# Patient Record
Sex: Male | Born: 1955
Health system: Southern US, Community
[De-identification: ages and names within clinical notes are randomized; demographics above are authoritative.]

## PROBLEM LIST (undated history)

## (undated) DIAGNOSIS — I255 Ischemic cardiomyopathy: Secondary | ICD-10-CM

## (undated) DIAGNOSIS — E669 Obesity, unspecified: Secondary | ICD-10-CM

## (undated) DIAGNOSIS — N503 Cyst of epididymis: Secondary | ICD-10-CM

## (undated) DIAGNOSIS — E119 Type 2 diabetes mellitus without complications: Secondary | ICD-10-CM

## (undated) DIAGNOSIS — I513 Intracardiac thrombosis, not elsewhere classified: Secondary | ICD-10-CM

## (undated) DIAGNOSIS — M545 Low back pain, unspecified: Secondary | ICD-10-CM

## (undated) DIAGNOSIS — Z8701 Personal history of pneumonia (recurrent): Secondary | ICD-10-CM

## (undated) DIAGNOSIS — M51369 Other intervertebral disc degeneration, lumbar region without mention of lumbar back pain or lower extremity pain: Secondary | ICD-10-CM

## (undated) DIAGNOSIS — R519 Headache, unspecified: Secondary | ICD-10-CM

## (undated) DIAGNOSIS — Z720 Tobacco use: Secondary | ICD-10-CM

## (undated) DIAGNOSIS — N493 Fournier gangrene: Secondary | ICD-10-CM

## (undated) DIAGNOSIS — K219 Gastro-esophageal reflux disease without esophagitis: Secondary | ICD-10-CM

## (undated) DIAGNOSIS — I2510 Atherosclerotic heart disease of native coronary artery without angina pectoris: Secondary | ICD-10-CM

## (undated) DIAGNOSIS — I10 Essential (primary) hypertension: Secondary | ICD-10-CM

## (undated) DIAGNOSIS — J189 Pneumonia, unspecified organism: Secondary | ICD-10-CM

## (undated) DIAGNOSIS — F1410 Cocaine abuse, uncomplicated: Secondary | ICD-10-CM

## (undated) DIAGNOSIS — G4730 Sleep apnea, unspecified: Secondary | ICD-10-CM

## (undated) DIAGNOSIS — N182 Chronic kidney disease, stage 2 (mild): Secondary | ICD-10-CM

## (undated) DIAGNOSIS — R51 Headache: Secondary | ICD-10-CM

## (undated) DIAGNOSIS — E785 Hyperlipidemia, unspecified: Secondary | ICD-10-CM

## (undated) DIAGNOSIS — M5126 Other intervertebral disc displacement, lumbar region: Secondary | ICD-10-CM

## (undated) DIAGNOSIS — G8929 Other chronic pain: Secondary | ICD-10-CM

## (undated) DIAGNOSIS — I252 Old myocardial infarction: Secondary | ICD-10-CM

## (undated) DIAGNOSIS — M5136 Other intervertebral disc degeneration, lumbar region: Secondary | ICD-10-CM

## (undated) HISTORY — DX: Intracardiac thrombosis, not elsewhere classified: I51.3

## (undated) HISTORY — PX: FRACTURE SURGERY: SHX138

## (undated) HISTORY — PX: APPENDECTOMY: SHX54

## (undated) HISTORY — PX: KNEE ARTHROSCOPY: SHX127

## (undated) HISTORY — DX: Personal history of pneumonia (recurrent): Z87.01

## (undated) HISTORY — DX: Essential (primary) hypertension: I10

## (undated) HISTORY — PX: WRIST FRACTURE SURGERY: SHX121

## (undated) HISTORY — PX: KNEE SURGERY: SHX244

## (undated) HISTORY — PX: CORONARY ANGIOPLASTY WITH STENT PLACEMENT: SHX49

---

## 2006-03-06 ENCOUNTER — Emergency Department (HOSPITAL_COMMUNITY): Admission: EM | Admit: 2006-03-06 | Discharge: 2006-03-06 | Payer: Self-pay | Admitting: Emergency Medicine

## 2006-03-07 ENCOUNTER — Encounter (INDEPENDENT_AMBULATORY_CARE_PROVIDER_SITE_OTHER): Payer: Self-pay | Admitting: Specialist

## 2006-03-07 ENCOUNTER — Ambulatory Visit (HOSPITAL_COMMUNITY): Admission: RE | Admit: 2006-03-07 | Discharge: 2006-03-07 | Payer: Self-pay | Admitting: Internal Medicine

## 2006-03-07 ENCOUNTER — Ambulatory Visit: Payer: Self-pay | Admitting: Internal Medicine

## 2007-01-16 ENCOUNTER — Emergency Department (HOSPITAL_COMMUNITY): Admission: EM | Admit: 2007-01-16 | Discharge: 2007-01-16 | Payer: Self-pay | Admitting: Emergency Medicine

## 2009-01-20 ENCOUNTER — Emergency Department (HOSPITAL_COMMUNITY): Admission: EM | Admit: 2009-01-20 | Discharge: 2009-01-20 | Payer: Self-pay | Admitting: Emergency Medicine

## 2009-10-29 ENCOUNTER — Emergency Department (HOSPITAL_COMMUNITY): Admission: EM | Admit: 2009-10-29 | Discharge: 2009-10-29 | Payer: Self-pay | Admitting: Emergency Medicine

## 2009-11-11 ENCOUNTER — Emergency Department (HOSPITAL_COMMUNITY)
Admission: EM | Admit: 2009-11-11 | Discharge: 2009-11-11 | Payer: Self-pay | Source: Home / Self Care | Admitting: Emergency Medicine

## 2009-11-21 ENCOUNTER — Emergency Department (HOSPITAL_COMMUNITY)
Admission: EM | Admit: 2009-11-21 | Discharge: 2009-11-21 | Payer: Self-pay | Source: Home / Self Care | Admitting: Emergency Medicine

## 2010-03-23 LAB — CBC
MCH: 26.3 pg (ref 26.0–34.0)
MCHC: 32 g/dL (ref 30.0–36.0)
MCV: 82.1 fL (ref 78.0–100.0)
Platelets: 282 10*3/uL (ref 150–400)
RDW: 15.7 % — ABNORMAL HIGH (ref 11.5–15.5)

## 2010-03-23 LAB — COMPREHENSIVE METABOLIC PANEL
AST: 17 U/L (ref 0–37)
BUN: 18 mg/dL (ref 6–23)
CO2: 31 mEq/L (ref 19–32)
Calcium: 9.5 mg/dL (ref 8.4–10.5)
Chloride: 99 mEq/L (ref 96–112)
Creatinine, Ser: 1.4 mg/dL (ref 0.4–1.5)
GFR calc Af Amer: >60 mL/min (ref >60)
GFR calc non Af Amer: 53 mL/min — ABNORMAL LOW (ref >60)
Glucose, Bld: 237 mg/dL — ABNORMAL HIGH (ref 70–99)
Total Bilirubin: 0.5 mg/dL (ref 0.3–1.2)

## 2010-03-23 LAB — DIFFERENTIAL
Basophils Absolute: 0 10*3/uL (ref 0.0–0.1)
Eosinophils Relative: 3 % (ref 0–5)
Lymphocytes Relative: 14 % (ref 12–46)
Neutrophils Relative %: 78 % — ABNORMAL HIGH (ref 43–77)

## 2010-03-23 LAB — CK TOTAL AND CKMB (NOT AT ARMC): Relative Index: INVALID (ref 0.0–2.5)

## 2010-03-23 LAB — TROPONIN I: Troponin I: <0.01 ng/mL (ref 0.00–0.06)

## 2010-05-28 NOTE — Op Note (Signed)
NAMELOGON, UTTECH              ACCOUNT NO.:  [id number]   MEDICAL RECORD NO.:  [id number]          PATIENT TYPE:  AMB   LOCATION:  DAY                           FACILITY:  [facility]   PHYSICIAN:  Lionel December, M.D.    DATE OF BIRTH:  04-Aug-1955   DATE OF PROCEDURE:  03/07/2006  DATE OF DISCHARGE:                               OPERATIVE REPORT   PROCEDURE:  Colonoscopy.   INDICATION:  [name] is a 55 year old African American male who had an  episode of hematochezia lasting a few days about three months ago.  Another episode started over the weekend.  He was seen in the emergency  room by Dr. Margretta Ditty yesterday and noted to have a normal hemoglobin.  He was scheduled for diagnostic colonoscopy.  He recalls that his last  colonoscopy was over ten years ago and was normal.  The procedure risks  were reviewed with the patient and informed consent was obtained.  Family history is negative for CRC.   MEDS FOR CONSCIOUS SEDATION:  Demerol 25 mg IV, Versed 8 mg IV in  divided dose.   FINDINGS:  The procedure was performed in the endoscopy suite.  The  patient's vital signs and O2 sat were monitored during the procedure and  remained stable.  The patient was placed in the left lateral position  and rectal examination was performed.  No abnormality was noted on  external or digital exam.  The Pentax videoscope was placed in the  rectum and advanced under vision into the sigmoid colon and beyond.  Preparation was excellent.  He had some liquid which was easily  suctioned out with the scope.  Two small polyps were noted at the  sigmoid colon, both were ablated via cold biopsy and submitted in one  container.  The scope was advanced to the cecum which was identified by  ileocecal valve and appendiceal orifice/stump.  Pictures were taken for  the record.  As the scope was withdrawn, the colonic mucosa was once  again carefully examined and there were no other polyps or mucosal  abnormalities.   Rectal mucosa was normal.  The scope was retroflexed to  examine the anorectal junction and he had hemorrhoids below the dentate  line and one between 6 and 7 o'clock had a linear tear covered with a  clot.  There was no active bleeding noted on washing.  Pictures were  taken for the record.  The endoscope was then withdrawn.  The patient  tolerated the procedure well.   FINAL DIAGNOSIS:  1. Two small polyps ablated via cold biopsy from sigmoid colon and      submitted in one container.  2. External hemorrhoids with stigmata of recent bleed, i.e., a clot      over a tear.   RECOMMENDATIONS:  1. High fiber diet plus fiber supplement 3-4 grams daily.  2. Anusol HC suppository one per rectum at bedtime for two weeks.      Prescription given for 14 days with one      refill.  3. The patient was also advised to make an appointment to  see his      primary care physician and be evaluated for sleep apnea.      Lionel December, M.D.  Electronically Signed     NR/MEDQ  D:  03/07/2006  T:  03/07/2006  Job:  [id number]   cc:   Rhae Lerner. Margretta Ditty, M.D.  [address]  [place]  [place] [address]

## 2012-07-07 ENCOUNTER — Other Ambulatory Visit: Payer: Self-pay

## 2012-07-07 ENCOUNTER — Inpatient Hospital Stay (HOSPITAL_COMMUNITY)
Admission: EM | Admit: 2012-07-07 | Discharge: 2012-07-10 | DRG: 282 | Disposition: A | Discharge status: Home / Self Care | Payer: Medicare Other | Attending: Cardiology | Admitting: Cardiology

## 2012-07-07 ENCOUNTER — Encounter (HOSPITAL_COMMUNITY): Payer: Self-pay | Admitting: Emergency Medicine

## 2012-07-07 ENCOUNTER — Emergency Department (HOSPITAL_COMMUNITY): Payer: Medicare Other

## 2012-07-07 DIAGNOSIS — N289 Disorder of kidney and ureter, unspecified: Secondary | ICD-10-CM

## 2012-07-07 DIAGNOSIS — I214 Non-ST elevation (NSTEMI) myocardial infarction: Principal | ICD-10-CM | POA: Diagnosis present

## 2012-07-07 DIAGNOSIS — F1410 Cocaine abuse, uncomplicated: Secondary | ICD-10-CM | POA: Diagnosis present

## 2012-07-07 DIAGNOSIS — E119 Type 2 diabetes mellitus without complications: Secondary | ICD-10-CM

## 2012-07-07 DIAGNOSIS — I2510 Atherosclerotic heart disease of native coronary artery without angina pectoris: Secondary | ICD-10-CM | POA: Diagnosis present

## 2012-07-07 DIAGNOSIS — E785 Hyperlipidemia, unspecified: Secondary | ICD-10-CM | POA: Diagnosis present

## 2012-07-07 DIAGNOSIS — I10 Essential (primary) hypertension: Secondary | ICD-10-CM

## 2012-07-07 DIAGNOSIS — I119 Hypertensive heart disease without heart failure: Secondary | ICD-10-CM | POA: Diagnosis present

## 2012-07-07 DIAGNOSIS — F17200 Nicotine dependence, unspecified, uncomplicated: Secondary | ICD-10-CM | POA: Diagnosis present

## 2012-07-07 HISTORY — DX: Hyperlipidemia, unspecified: E78.5

## 2012-07-07 LAB — COMPREHENSIVE METABOLIC PANEL WITH GFR
ALT: 14 U/L (ref 0–53)
AST: 30 U/L (ref 0–37)
Albumin: 3.1 g/dL — ABNORMAL LOW (ref 3.5–5.2)
Alkaline Phosphatase: 99 U/L (ref 39–117)
BUN: 18 mg/dL (ref 6–23)
CO2: 23 meq/L (ref 19–32)
Calcium: 8.4 mg/dL (ref 8.4–10.5)
Chloride: 97 meq/L (ref 96–112)
Creatinine, Ser: 1.07 mg/dL (ref 0.50–1.35)
GFR calc Af Amer: 87 mL/min — ABNORMAL LOW
GFR calc non Af Amer: 75 mL/min — ABNORMAL LOW
Glucose, Bld: 223 mg/dL — ABNORMAL HIGH (ref 70–99)
Potassium: 3.1 meq/L — ABNORMAL LOW (ref 3.5–5.1)
Sodium: 133 meq/L — ABNORMAL LOW (ref 135–145)
Total Bilirubin: 0.2 mg/dL — ABNORMAL LOW (ref 0.3–1.2)
Total Protein: 6.8 g/dL (ref 6.0–8.3)

## 2012-07-07 LAB — POCT I-STAT, CHEM 8
BUN: 17 mg/dL (ref 6–23)
Calcium, Ion: 1.05 mmol/L — ABNORMAL LOW (ref 1.12–1.23)
Chloride: 103 mEq/L (ref 96–112)
Chloride: 103 mEq/L (ref 96–112)
Glucose, Bld: 171 mg/dL — ABNORMAL HIGH (ref 70–99)
HCT: 52 % (ref 39.0–52.0)
Potassium: 3.6 mEq/L (ref 3.5–5.1)
Potassium: 3.7 mEq/L (ref 3.5–5.1)
Sodium: 140 mEq/L (ref 135–145)

## 2012-07-07 LAB — PROTIME-INR
INR: 1.04 (ref 0.00–1.49)
Prothrombin Time: 12.8 seconds (ref 11.6–15.2)
Prothrombin Time: 13.4 s (ref 11.6–15.2)

## 2012-07-07 LAB — D-DIMER, QUANTITATIVE: D-Dimer, Quant: 0.48 ug/mL-FEU (ref 0.00–0.48)

## 2012-07-07 LAB — BASIC METABOLIC PANEL
BUN: 18 mg/dL (ref 6–23)
Calcium: 9.8 mg/dL (ref 8.4–10.5)
GFR calc Af Amer: 65 mL/min — ABNORMAL LOW (ref >90)
GFR calc non Af Amer: 56 mL/min — ABNORMAL LOW (ref >90)
Glucose, Bld: 177 mg/dL — ABNORMAL HIGH (ref 70–99)
Sodium: 137 mEq/L (ref 135–145)

## 2012-07-07 LAB — CBC WITH DIFFERENTIAL/PLATELET
Basophils Absolute: 0 10*3/uL (ref 0.0–0.1)
Basophils Absolute: 0.1 K/uL (ref 0.0–0.1)
Basophils Relative: 0 % (ref 0–1)
Basophils Relative: 0 % (ref 0–1)
Eosinophils Absolute: 0.4 10*3/uL (ref 0.0–0.7)
Eosinophils Absolute: 0.4 K/uL (ref 0.0–0.7)
Eosinophils Relative: 3 % (ref 0–5)
Eosinophils Relative: 4 % (ref 0–5)
HCT: 45.4 % (ref 39.0–52.0)
Hemoglobin: 15.3 g/dL (ref 13.0–17.0)
Lymphocytes Relative: 22 % (ref 12–46)
Lymphs Abs: 2.6 K/uL (ref 0.7–4.0)
MCH: 26.9 pg (ref 26.0–34.0)
MCH: 26.5 pg (ref 26.0–34.0)
MCHC: 33.7 g/dL (ref 30.0–36.0)
MCV: 79.3 fL (ref 78.0–100.0)
MCV: 78.7 fL (ref 78.0–100.0)
Monocytes Absolute: 0.5 K/uL (ref 0.1–1.0)
Monocytes Relative: 4 % (ref 3–12)
Neutro Abs: 8.4 K/uL — ABNORMAL HIGH (ref 1.7–7.7)
Neutrophils Relative %: 71 % (ref 43–77)
Platelets: 273 10*3/uL (ref 150–400)
Platelets: 248 K/uL (ref 150–400)
RBC: 5.77 MIL/uL (ref 4.22–5.81)
RDW: 15.1 % (ref 11.5–15.5)
RDW: 15.4 % (ref 11.5–15.5)
WBC: 13.6 10*3/uL — ABNORMAL HIGH (ref 4.0–10.5)
WBC: 11.9 K/uL — ABNORMAL HIGH (ref 4.0–10.5)

## 2012-07-07 LAB — POCT I-STAT TROPONIN I: Troponin i, poc: 2.04 ng/mL (ref 0.00–0.08)

## 2012-07-07 LAB — HEPATIC FUNCTION PANEL
ALT: 16 U/L (ref 0–53)
AST: 33 U/L (ref 0–37)
Total Protein: 8 g/dL (ref 6.0–8.3)

## 2012-07-07 LAB — MRSA PCR SCREENING: MRSA by PCR: NEGATIVE

## 2012-07-07 MED ORDER — ASPIRIN EC 81 MG PO TBEC
81.0000 mg | DELAYED_RELEASE_TABLET | Freq: Every day | ORAL | Status: DC
  Filled 2012-07-07: qty 1

## 2012-07-07 MED ORDER — HEPARIN (PORCINE) IN NACL 100-0.45 UNIT/ML-% IJ SOLN: 12.0000 [IU]/kg/h | INTRAMUSCULAR | Status: DC

## 2012-07-07 MED ORDER — VITAMIN D (ERGOCALCIFEROL) 1.25 MG (50000 UNIT) PO CAPS: 50000.0000 [IU] | ORAL_CAPSULE | ORAL | Status: DC

## 2012-07-07 MED ORDER — GLIPIZIDE 10 MG PO TABS
10.0000 mg | ORAL_TABLET | Freq: Two times a day (BID) | ORAL | Status: DC
  Administered 2012-07-08 – 2012-07-10 (×5): 10 mg via ORAL
  Filled 2012-07-07 (×8): qty 1

## 2012-07-07 MED ORDER — SODIUM CHLORIDE 0.9 % IV SOLN
Freq: Once | INTRAVENOUS | Status: AC
  Administered 2012-07-07: 14:00:00 via INTRAVENOUS

## 2012-07-07 MED ORDER — HEPARIN (PORCINE) IN NACL 100-0.45 UNIT/ML-% IJ SOLN
1950.0000 [IU]/h | INTRAMUSCULAR | Status: DC
  Administered 2012-07-07: 1350 [IU]/h via INTRAVENOUS
  Administered 2012-07-08: 1750 [IU]/h via INTRAVENOUS
  Filled 2012-07-07 (×2): qty 250

## 2012-07-07 MED ORDER — MAGNESIUM SULFATE 40 MG/ML IJ SOLN
2.0000 g | INTRAMUSCULAR | Status: AC
  Administered 2012-07-07: 2 g via INTRAVENOUS
  Filled 2012-07-07: qty 50

## 2012-07-07 MED ORDER — GABAPENTIN 600 MG PO TABS
300.0000 mg | ORAL_TABLET | Freq: Three times a day (TID) | ORAL | Status: DC
  Filled 2012-07-07: qty 0.5

## 2012-07-07 MED ORDER — SODIUM CHLORIDE 0.9 % IJ SOLN
3.0000 mL | Freq: Two times a day (BID) | INTRAMUSCULAR | Status: DC
  Administered 2012-07-07: 3 mL via INTRAVENOUS

## 2012-07-07 MED ORDER — SODIUM CHLORIDE 0.9 % IV SOLN: INTRAVENOUS | Status: DC

## 2012-07-07 MED ORDER — SODIUM CHLORIDE 0.9 % IV SOLN: 250.0000 mL | INTRAVENOUS | Status: DC | PRN

## 2012-07-07 MED ORDER — HEPARIN BOLUS VIA INFUSION
3000.0000 [IU] | Freq: Once | INTRAVENOUS | Status: AC
  Administered 2012-07-07: 3000 [IU] via INTRAVENOUS
  Filled 2012-07-07: qty 3000

## 2012-07-07 MED ORDER — GABAPENTIN 300 MG PO CAPS
300.0000 mg | ORAL_CAPSULE | Freq: Three times a day (TID) | ORAL | Status: DC
  Administered 2012-07-07 – 2012-07-10 (×8): 300 mg via ORAL
  Filled 2012-07-07 (×10): qty 1

## 2012-07-07 MED ORDER — NICOTINE 21 MG/24HR TD PT24
MEDICATED_PATCH | TRANSDERMAL | Status: AC
  Administered 2012-07-07: 21 mg via TRANSDERMAL
  Filled 2012-07-07: qty 1

## 2012-07-07 MED ORDER — HEPARIN (PORCINE) IN NACL 100-0.45 UNIT/ML-% IJ SOLN
12.0000 [IU]/kg/h | INTRAMUSCULAR | Status: DC
  Administered 2012-07-07: 12 [IU]/kg/h via INTRAVENOUS
  Filled 2012-07-07: qty 250

## 2012-07-07 MED ORDER — DIAZEPAM 2 MG PO TABS
2.0000 mg | ORAL_TABLET | ORAL | Status: DC
  Filled 2012-07-07: qty 1

## 2012-07-07 MED ORDER — HEPARIN BOLUS VIA INFUSION
4000.0000 [IU] | Freq: Once | INTRAVENOUS | Status: AC
  Administered 2012-07-07: 4000 [IU] via INTRAVENOUS

## 2012-07-07 MED ORDER — LISINOPRIL 2.5 MG PO TABS
2.5000 mg | ORAL_TABLET | Freq: Every day | ORAL | Status: DC
  Filled 2012-07-07: qty 1

## 2012-07-07 MED ORDER — NITROGLYCERIN 0.4 MG SL SUBL
0.4000 mg | SUBLINGUAL_TABLET | SUBLINGUAL | Status: DC | PRN
  Administered 2012-07-08 (×2): 0.4 mg via SUBLINGUAL
  Filled 2012-07-07: qty 25

## 2012-07-07 MED ORDER — SODIUM CHLORIDE 0.9 % IV SOLN
INTRAVENOUS | Status: AC
  Administered 2012-07-07: 17:00:00 via INTRAVENOUS

## 2012-07-07 MED ORDER — ACETAMINOPHEN 325 MG PO TABS: 650.0000 mg | ORAL_TABLET | ORAL | Status: DC | PRN

## 2012-07-07 MED ORDER — NICOTINE 21 MG/24HR TD PT24: 21.0000 mg | MEDICATED_PATCH | Freq: Once | TRANSDERMAL | Status: AC

## 2012-07-07 MED ORDER — NYSTATIN 100000 UNIT/ML MT SUSP
500000.0000 [IU] | Freq: Four times a day (QID) | OROMUCOSAL | Status: DC
  Administered 2012-07-07 – 2012-07-10 (×9): 500000 [IU] via ORAL
  Filled 2012-07-07 (×13): qty 5

## 2012-07-07 MED ORDER — SODIUM CHLORIDE 0.9 % IJ SOLN: 3.0000 mL | INTRAMUSCULAR | Status: DC | PRN

## 2012-07-07 MED ORDER — ONDANSETRON HCL 4 MG/2ML IJ SOLN: 4.0000 mg | Freq: Four times a day (QID) | INTRAMUSCULAR | Status: DC | PRN

## 2012-07-07 MED ORDER — ATORVASTATIN CALCIUM 40 MG PO TABS
40.0000 mg | ORAL_TABLET | Freq: Every day | ORAL | Status: DC
  Administered 2012-07-08 – 2012-07-10 (×3): 40 mg via ORAL
  Filled 2012-07-07 (×3): qty 1

## 2012-07-07 MED ORDER — ONDANSETRON HCL 4 MG/2ML IJ SOLN: 4.0000 mg | Freq: Three times a day (TID) | INTRAMUSCULAR | Status: DC | PRN

## 2012-07-07 NOTE — ED Notes (Signed)
Patient arrives via EMS from home with c/o substernal chest pain that started this morning, intermittent. Patient reports one episode last night after eating chicken and that episode eased off. EMS gave 324 mg aspirin and 3 SL nitro that decreased pain. Patient is short of breath.

## 2012-07-07 NOTE — ED Notes (Signed)
Requesting nicotine patch. Dr Hyacinth Meeker approved and order obtained.

## 2012-07-07 NOTE — ED Notes (Signed)
[facility] given rm assignment and has a truck in route.

## 2012-07-07 NOTE — ED Provider Notes (Addendum)
History    This chart was scribed for Russell Roller, MD by [name], ED Scribe. This patient was seen in room [facility] and the patient's care was started 1:24 PM.  CSN: [id number] Arrival date & time 07/07/12  1311  First MD Initiated Contact with Patient 07/07/12 1322     Chief Complaint  Patient presents with  . Chest Pain    The history is provided by the patient. No language interpreter was used.    HPI Comments: [name] is a 57 y.o. male brought in by ambulance, who presents to the Emergency Department complaining of constant, substernal chest pain and pressure starting this morning at 10am. Pt was given 3 SL nitro and 324mg  aspirin by EMS that gradually decreased the pain. Pt states he was laying down when he felt the need to belch but felt "it was hung up." States pain radiates to back. He reports 1 episode last night after eating chicken but that eased off. He has a h/o DM, HTN, HLD. He denies h/o blood clots, MI, stroke. He denies having a stress test performed in the past and denies any known heart blockages. Pt is a current everyday smoker but denies alcohol use.   Pt admits to cocaine use yesterday   Past Medical History  Diagnosis Date  . Hypertension   . Diabetes mellitus without complication   . Hyperlipidemia    No past surgical history on file. No family history on file. History  Substance Use Topics  . Smoking status: Current Every Day Smoker    Types: Cigarettes  . Smokeless tobacco: Not on file  . Alcohol Use: No    Review of Systems A complete 10 system review of systems was obtained and all systems are negative except as noted in the HPI and PMH.   Allergies  Review of patient's allergies indicates no known allergies.  Home Medications   Current Outpatient Rx  Name  Route  Sig  Dispense  Refill  . atorvastatin (LIPITOR) 80 MG tablet   Oral   Take 40 mg by mouth daily.         Marland Kitchen gabapentin (NEURONTIN) 600 MG tablet   Oral    Take 300 mg by mouth 3 (three) times daily.         Marland Kitchen glipiZIDE (GLUCOTROL) 5 MG tablet   Oral   Take 10 mg by mouth 2 (two) times daily before a meal.         . HYDROcodone-acetaminophen (NORCO/VICODIN) 5-325 MG per tablet   Oral   Take 1 tablet by mouth 2 (two) times daily as needed for pain.         Marland Kitchen lisinopril (PRINIVIL,ZESTRIL) 5 MG tablet   Oral   Take 2.5 mg by mouth daily.         . metFORMIN (GLUCOPHAGE) 1000 MG tablet   Oral   Take 1,000 mg by mouth 2 (two) times daily with a meal.         . naproxen (NAPROSYN) 500 MG tablet   Oral   Take 500 mg by mouth 2 (two) times daily with a meal.         . nystatin (MYCOSTATIN) 100000 UNIT/ML suspension   Oral   Take 500,000 Units by mouth 4 (four) times daily.         . Vitamin D, Ergocalciferol, (DRISDOL) 50000 UNITS CAPS   Oral   Take 50,000 Units by mouth 2 (two) times a week.  Takes on Wednesday and Saturday.          BP 131/82  Pulse 71  Temp(Src) 98.3 F (36.8 C) (Oral)  Resp 24  Wt 250 lb (113.399 kg)  SpO2 97% Physical Exam  Nursing note and vitals reviewed. Constitutional: He is oriented to person, place, and time. He appears well-developed and well-nourished.  Uncomfortable appearing.   HENT:  Head: Normocephalic and atraumatic.  Eyes: Conjunctivae and EOM are normal. Pupils are equal, round, and reactive to light.  Neck: Normal range of motion. Neck supple.  Cardiovascular: Normal rate, regular rhythm, normal heart sounds and intact distal pulses.   No murmur heard. Pulmonary/Chest: Breath sounds normal. Tachypnea (mildly) noted. No respiratory distress. He has no wheezes. He has no rales.  Abdominal: Soft. Bowel sounds are normal. There is no tenderness.  Abdomen is obese.  Musculoskeletal: Normal range of motion.  Lipoma to medial trapezius.   Neurological: He is alert and oriented to person, place, and time.  Skin: Skin is warm and dry.  Psychiatric: He has a normal mood and  affect.    ED Course  Procedures (including critical care time)  DIAGNOSTIC STUDIES: Oxygen Saturation is 97% on RA, adequate by my interpretation.    COORDINATION OF CARE: 1:24 PM Discussed treatment plan with pt at bedside and pt agreed to plan.   Labs Reviewed  CBC WITH DIFFERENTIAL - Abnormal; Notable for the following:    WBC 13.6 (*)    RBC 5.98 (*)    Neutrophils Relative % 80 (*)    Neutro Abs 10.8 (*)    All other components within normal limits  POCT I-STAT, CHEM 8 - Abnormal; Notable for the following:    Creatinine, Ser 1.40 (*)    Glucose, Bld 171 (*)    Calcium, Ion 1.06 (*)    Hemoglobin 17.7 (*)    All other components within normal limits  POCT I-STAT, CHEM 8 - Abnormal; Notable for the following:    Glucose, Bld 171 (*)    Calcium, Ion 1.05 (*)    Hemoglobin 17.3 (*)    All other components within normal limits  POCT I-STAT TROPONIN I - Abnormal; Notable for the following:    Troponin i, poc 2.04 (*)    All other components within normal limits  D-DIMER, QUANTITATIVE  APTT  PROTIME-INR  TROPONIN I  BASIC METABOLIC PANEL  HEPATIC FUNCTION PANEL  LIPASE, BLOOD   Dg Chest Port 1 View  07/07/2012   *RADIOLOGY REPORT*  Clinical Data: Chest pain  PORTABLE CHEST - 1 VIEW  Comparison: 11/21/2009  Findings: Low lung volumes.  Lungs are grossly clear.  Mild cardiomegaly.  Normal vascularity.  No pneumothorax.  IMPRESSION: Cardiomegaly without decompensation.   Original Report Authenticated By: Jolaine Click, M.D.   1. NSTEMI (non-ST elevated myocardial infarction)   2. Renal insufficiency     MDM  Pt appears uncomfortable, he has ongoing pain which is now mild but persistent, ASA given pta, nitro with improvement, ECG with deep T wave Inversions in the precordial leads, concern for AMI.  ED ECG REPORT  I personally interpreted this EKG   Date: 07/07/2012   Rate: 72  Rhythm: normal sinus rhythm  QRS Axis: left  Intervals: normal other than prolonged QT   ST/T Wave abnormalities: TWI in the lateral precordial leads and lateral leads  Conduction Disutrbances:none  Narrative Interpretation:   Old EKG Reviewed: c/w 11/21/09, sig changes, TWI present.   Labs show Renal insufficiency and AMI  with trop > 2, Ddimer is normal.    Started on blood thinners - heparin drip, reevalted - still with normla MS, more comfortable appearing.  D/w Dr. Mayford Knife who accepts to step down bed.  Critical care delivered.  CRITICAL CARE Performed by: [name] Total critical care time: 35 Critical care time was exclusive of separately billable procedures and treating other patients. Critical care was necessary to treat or prevent imminent or life-threatening deterioration. Critical care was time spent personally by me on the following activities: development of treatment plan with patient and/or surrogate as well as nursing, discussions with consultants, evaluation of patient's response to treatment, examination of patient, obtaining history from patient or surrogate, ordering and performing treatments and interventions, ordering and review of laboratory studies, ordering and review of radiographic studies, pulse oximetry and re-evaluation of patient's condition.   Meds given in ED:  Medications  magnesium sulfate IVPB 2 g 50 mL (2 g Intravenous New Bag/Given 07/07/12 1347)  heparin ADULT infusion 100 units/mL (25000 units/250 mL) (12 Units/kg/hr  113.4 kg Intravenous New Bag/Given 07/07/12 1400)  0.9 %  sodium chloride infusion ( Intravenous New Bag/Given 07/07/12 1347)  heparin bolus via infusion 4,000 Units (4,000 Units Intravenous Bolus from Bag 07/07/12 1358)    New Prescriptions   No medications on file      I personally performed the services described in this documentation, which was scribed in my presence. The recorded information has been reviewed and is accurate.     Russell Roller, MD 07/07/12 1413  Russell Roller, MD 07/07/12 [phone]

## 2012-07-07 NOTE — H&P (Signed)
Admit date: 07/07/2012 Referring Physician Jeani Hawking ER MD Primary MD [facility] Chief complaint/reason for admission: chest pain  HPI: This is a 57yo AAM with a history of DM, HTN and dyslipidemia who was in his USOH until yesterday around 12Noon.  He says that he had eaten some [employer] and started having chest discomfort in the midsternal region and felt like he had to belch but couldn't.  It radiated into his back.  This was off and on and improved after eating popcorn.  This am it started back with SOB and diaphoresis with dizziness and went to the ER. In the ER he was given SL NTG with improvement in his discomfort but did not make it go away.  When he got to the ER he stood up to change beds and belched and the discomfort went away.  Currently he is pain free. He was noted to have an elevated troponin and was transferred to [facility].   Of note patient admitted to cocaine use a day before his Chest pain started.    PMH:    Past Medical History  Diagnosis Date  . Hypertension   . Diabetes mellitus without complication   . Hyperlipidemia bulging discs in back     PSH:   Appendectomy and knee pinning  ALLERGIES:   Review of patient's allergies indicates no known allergies.  Prior to Admit Meds:   Prescriptions prior to admission  Medication Sig Dispense Refill  . atorvastatin (LIPITOR) 80 MG tablet Take 40 mg by mouth daily.      Marland Kitchen gabapentin (NEURONTIN) 600 MG tablet Take 300 mg by mouth 3 (three) times daily.      Marland Kitchen glipiZIDE (GLUCOTROL) 5 MG tablet Take 10 mg by mouth 2 (two) times daily before a meal.      . HYDROcodone-acetaminophen (NORCO/VICODIN) 5-325 MG per tablet Take 1 tablet by mouth 2 (two) times daily as needed for pain.      Marland Kitchen lisinopril (PRINIVIL,ZESTRIL) 5 MG tablet Take 2.5 mg by mouth daily.      . metFORMIN (GLUCOPHAGE) 1000 MG tablet Take 1,000 mg by mouth 2 (two) times daily with a meal.      . naproxen (NAPROSYN) 500 MG tablet Take 500 mg by  mouth 2 (two) times daily with a meal.      . nystatin (MYCOSTATIN) 100000 UNIT/ML suspension Take 500,000 Units by mouth 4 (four) times daily.      . Vitamin D, Ergocalciferol, (DRISDOL) 50000 UNITS CAPS Take 50,000 Units by mouth 2 (two) times a week. Takes on Wednesday and Saturday.       Family HX:   Mother alive with ? Heart problem.  Does not know his dad.  Aunt with [facility] Social HX:    History   Social History  . Marital Status: Single    Spouse Name: N/A    Number of Children: N/A  . Years of Education: N/A   Occupational History  . Not on file.   Social History Main Topics  . Smoking status: Current Every Day Smoker    Types: Cigarettes 1-2 ppd for 40 years  . Smokeless tobacco: Not on file  . Alcohol Use: No  . Drug Use: Yes    Special: Cocaine - last use 1 day before CP episode  . Sexually Active: Not on file   Other Topics Concern  . Not on file   Social History Narrative  . No narrative on file  ROS:  All 11 ROS were addressed and are negative except what is stated in the HPI  PHYSICAL EXAM Filed Vitals:   07/07/12 1700  BP: 127/80  Pulse: 69  Temp:   Resp: 25   General: Well developed, well nourished, in no acute distress Head: Eyes PERRLA, No xanthomas.   Normal cephalic and atramatic  Lungs:   Clear bilaterally to auscultation and percussion. Heart:   HRRR S1 S2 Pulses are 2+ & equal.            No carotid bruit. No JVD.  No abdominal bruits. No femoral bruits. Abdomen: Bowel sounds are positive, abdomen soft and non-tender without masses  Extremities:   No clubbing, cyanosis or edema.  DP +1 Neuro: Alert and oriented X 3. Psych:  Good affect, responds appropriately   Labs:   Lab Results  Component Value Date   WBC 13.6* 07/07/2012   HGB 17.3* 07/07/2012   HCT 51.0 07/07/2012   MCV 79.3 07/07/2012   PLT 273 07/07/2012    Recent Labs Lab 07/07/12 1324  07/07/12 1336  NA 137  < > 140  K 3.7  < > 3.7  CL 96  < > 103  CO2 27  --   --    BUN 18  < > 17  CREATININE 1.35  < > 1.30  CALCIUM 9.8  --   --   PROT 8.0  --   --   BILITOT 0.2*  --   --   ALKPHOS 111  --   --   ALT 16  --   --   AST 33  --   --   GLUCOSE 177*  < > 171*  < > = values in this interval not displayed. Lab Results  Component Value Date   CKTOTAL 74 11/21/2009   CKMB 0.8 11/21/2009   TROPONINI 4.52* 07/07/2012   No results found for this basename: PTT   Lab Results  Component Value Date   INR 0.98 07/07/2012         Radiology: *RADIOLOGY REPORT*  Clinical Data: Chest pain  PORTABLE CHEST - 1 VIEW  Comparison: 11/21/2009  Findings: Low lung volumes. Lungs are grossly clear. Mild  cardiomegaly. Normal vascularity. No pneumothorax.  IMPRESSION:  Cardiomegaly without decompensation.  Original Report Authenticated By: Jolaine Click, M.D.    EKG:  NSR with deeply inverted T waves in the anterolateral leads  ASSESSMENT:  1.  Acute NSTEMI after cocaine use - now pain free 2.  Polysubstance abuse with cocaine and Tobacco 3.  DM 4.  HTN 5.  dyslipidemia  PLAN:   1.  Admit to CCU 2.  Cycle cardiac enzymes 3.  IV Heparin gtt per pharmacy 4.  No beta blocker due to recent cocaine use 5.  Urine drug screen 6.  ASA/statin 7.  NPO after midnight tomorrow night 8.  Cardiac cath Monday 6/30 Cardiac catheterization was discussed with the patient fully including risks on myocardial infarction, death, stroke, bleeding, arrhythmia, dye allergy, renal insufficiency or bleeding.  All patient questions and concerns were discussed and the patient understands and is willing to proceed.     Quintella Reichert, MD  07/07/2012  5:44 PM

## 2012-07-07 NOTE — ED Notes (Signed)
[facility] at bedside for transport. Patient no distress. Left Department with crew at this time.

## 2012-07-07 NOTE — ED Notes (Signed)
Report given to Marylene Land, charge RN unit [address]

## 2012-07-07 NOTE — ED Notes (Signed)
CRITICAL VALUE ALERT  Critical value received:  Troponin  Date of notification:  07/07/12  Time of notification:  1336  Critical value read back:no (istat, read by primary RN)  Nurse who received alert:  Lake Bells, RN  MD notified (1st page):  Dr Hyacinth Meeker [phone]

## 2012-07-07 NOTE — Progress Notes (Signed)
ANTICOAGULATION CONSULT NOTE - Initial Consult  Pharmacy Consult for heparin  Indication: chest pain/ACS  No Known Allergies  Patient Measurements: Height: 5\' 9"  (175.3 cm) Weight: 229 lb 0.9 oz (103.9 kg) IBW/kg (Calculated) : 70.7 Heparin Dosing Weight: 94  Vital Signs: Temp: 98.2 F (36.8 C) (06/28 1954) Temp src: Oral (06/28 1955) BP: 133/86 mmHg (06/28 2000) Pulse Rate: 79 (06/28 2000)  Labs:  Recent Labs  07/07/12 1324 07/07/12 1330 07/07/12 1336 07/07/12 2238  HGB 16.1 17.7* 17.3* 15.3  HCT 47.4 52.0 51.0 45.4  PLT 273  --   --  248  APTT 30  --   --  47*  LABPROT 12.8  --   --  13.4  INR 0.98  --   --  1.04  HEPARINUNFRC  --   --   --  0.11*  CREATININE 1.35 1.40* 1.30  --   TROPONINI 4.52*  --   --   --     Estimated Creatinine Clearance: 73.6 ml/min (by C-G formula based on Cr of 1.3).   Medical History: Past Medical History  Diagnosis Date  . Hypertension   . Diabetes mellitus without complication   . Hyperlipidemia     Medications:  Scheduled:  . sodium chloride   Intravenous STAT  . [START ON 07/08/2012] aspirin EC  81 mg Oral Daily  . [START ON 07/08/2012] atorvastatin  40 mg Oral Daily  . [START ON 07/09/2012] diazepam  2 mg Oral On Call  . gabapentin  300 mg Oral TID  . [START ON 07/08/2012] glipiZIDE  10 mg Oral BID AC  . [START ON 07/08/2012] lisinopril  2.5 mg Oral Daily  . nystatin  500,000 Units Oral QID  . sodium chloride  3 mL Intravenous Q12H  . [START ON 07/11/2012] Vitamin D (Ergocalciferol)  50,000 Units Oral Custom    Assessment: 57 yo male admitted with NSTEMI after cocaine use. At [facility] patient received heparin 4000 unit bolus, then IV infusion of 1350 units/hr. Pharmacy to manage heparin. Heparin level (0.11) is below-goal. No problem with line / infusion and no bleeding per RN.   Goal of Therapy:  Heparin level 0.3-0.7 units/ml Monitor platelets by anticoagulation protocol: Yes   Plan:  1. Heparin 3000 unit IV bolus, then  increase IV infusion to 1750 units/hr.  2. Heparin level in 6 hours.  3. Daily CBC, heparin level   Emeline Gins 07/07/2012,11:07 PM

## 2012-07-07 NOTE — ED Notes (Signed)
CRITICAL VALUE ALERT  Critical value received:  troponin  Date of notification:  07/07/12  Time of notification:  1442  Critical value read back:yes  Nurse who received alert:  Lake Bells, RN  MD notified (1st page):  Dr Hyacinth Meeker  Time of first page:  [phone]

## 2012-07-08 ENCOUNTER — Ambulatory Visit (HOSPITAL_COMMUNITY): Admit: 2012-07-08 | Payer: Self-pay | Admitting: Interventional Cardiology

## 2012-07-08 ENCOUNTER — Encounter (HOSPITAL_COMMUNITY)
Admission: EM | Disposition: A | Discharge status: Home / Self Care | Payer: Self-pay | Source: Home / Self Care | Attending: Cardiology

## 2012-07-08 HISTORY — PX: LEFT HEART CATH: SHX5478

## 2012-07-08 HISTORY — PX: PERCUTANEOUS CORONARY STENT INTERVENTION (PCI-S): SHX5485

## 2012-07-08 LAB — TROPONIN I: Troponin I: 4.34 ng/mL (ref <0.30)

## 2012-07-08 LAB — GLUCOSE, CAPILLARY
Glucose-Capillary: 114 mg/dL — ABNORMAL HIGH (ref 70–99)
Glucose-Capillary: 101 mg/dL — ABNORMAL HIGH (ref 70–99)

## 2012-07-08 LAB — LIPID PANEL
HDL: 25 mg/dL — ABNORMAL LOW (ref >39)
LDL Cholesterol: 101 mg/dL — ABNORMAL HIGH (ref 0–99)
Total CHOL/HDL Ratio: 6.2 RATIO

## 2012-07-08 LAB — CBC
HCT: 44.4 % (ref 39.0–52.0)
MCH: 26.9 pg (ref 26.0–34.0)
MCV: 78.6 fL (ref 78.0–100.0)
RBC: 5.65 MIL/uL (ref 4.22–5.81)
WBC: 12.7 10*3/uL — ABNORMAL HIGH (ref 4.0–10.5)

## 2012-07-08 LAB — BASIC METABOLIC PANEL
BUN: 16 mg/dL (ref 6–23)
Calcium: 8.4 mg/dL (ref 8.4–10.5)
Creatinine, Ser: 1.23 mg/dL (ref 0.50–1.35)
GFR calc non Af Amer: 63 mL/min — ABNORMAL LOW (ref >90)
Glucose, Bld: 170 mg/dL — ABNORMAL HIGH (ref 70–99)

## 2012-07-08 LAB — PLATELET INHIBITION P2Y12: Platelet Function  P2Y12: 219 [PRU] (ref 194–418)

## 2012-07-08 LAB — RAPID URINE DRUG SCREEN, HOSP PERFORMED
Amphetamines: NOT DETECTED
Cocaine: POSITIVE — AB
Opiates: NOT DETECTED

## 2012-07-08 SURGERY — LEFT HEART CATH
Anesthesia: Moderate Sedation | Laterality: Bilateral

## 2012-07-08 MED ORDER — SODIUM CHLORIDE 0.9 % IV SOLN
1.0000 mL/kg/h | INTRAVENOUS | Status: AC
  Administered 2012-07-08: 1 mL/kg/h via INTRAVENOUS

## 2012-07-08 MED ORDER — BIVALIRUDIN 250 MG IV SOLR
INTRAVENOUS | Status: AC
  Filled 2012-07-08: qty 250

## 2012-07-08 MED ORDER — NITROGLYCERIN 0.2 MG/ML ON CALL CATH LAB
INTRAVENOUS | Status: AC
  Filled 2012-07-08: qty 1

## 2012-07-08 MED ORDER — SODIUM CHLORIDE 0.9 % IV SOLN
INTRAVENOUS | Status: DC
  Administered 2012-07-08: 07:00:00 via INTRAVENOUS

## 2012-07-08 MED ORDER — DIAZEPAM 2 MG PO TABS
2.0000 mg | ORAL_TABLET | ORAL | Status: AC
  Administered 2012-07-08: 2 mg via ORAL

## 2012-07-08 MED ORDER — MORPHINE SULFATE 2 MG/ML IJ SOLN
2.0000 mg | INTRAMUSCULAR | Status: DC | PRN
  Administered 2012-07-08 – 2012-07-10 (×3): 2 mg via INTRAVENOUS
  Filled 2012-07-08 (×3): qty 1

## 2012-07-08 MED ORDER — FENTANYL CITRATE 0.05 MG/ML IJ SOLN
INTRAMUSCULAR | Status: AC
  Filled 2012-07-08: qty 2

## 2012-07-08 MED ORDER — MIDAZOLAM HCL 2 MG/2ML IJ SOLN
INTRAMUSCULAR | Status: AC
  Filled 2012-07-08: qty 2

## 2012-07-08 MED ORDER — ASPIRIN 81 MG PO CHEW
81.0000 mg | CHEWABLE_TABLET | Freq: Every day | ORAL | Status: DC
  Administered 2012-07-08 – 2012-07-10 (×3): 81 mg via ORAL
  Filled 2012-07-08: qty 1

## 2012-07-08 MED ORDER — TICAGRELOR 90 MG PO TABS
ORAL_TABLET | ORAL | Status: AC
  Filled 2012-07-08: qty 2

## 2012-07-08 MED ORDER — ONDANSETRON HCL 4 MG/2ML IJ SOLN: 4.0000 mg | Freq: Four times a day (QID) | INTRAMUSCULAR | Status: DC | PRN

## 2012-07-08 MED ORDER — LIDOCAINE HCL (PF) 1 % IJ SOLN
INTRAMUSCULAR | Status: AC
  Filled 2012-07-08: qty 30

## 2012-07-08 MED ORDER — TICAGRELOR 90 MG PO TABS
90.0000 mg | ORAL_TABLET | Freq: Two times a day (BID) | ORAL | Status: DC
  Administered 2012-07-08 – 2012-07-10 (×5): 90 mg via ORAL
  Filled 2012-07-08 (×6): qty 1

## 2012-07-08 MED ORDER — HEPARIN (PORCINE) IN NACL 2-0.9 UNIT/ML-% IJ SOLN
INTRAMUSCULAR | Status: AC
  Filled 2012-07-08: qty 2000

## 2012-07-08 MED ORDER — ACETAMINOPHEN 325 MG PO TABS: 650.0000 mg | ORAL_TABLET | ORAL | Status: DC | PRN

## 2012-07-08 MED ORDER — ASPIRIN 81 MG PO CHEW
324.0000 mg | CHEWABLE_TABLET | Freq: Once | ORAL | Status: AC
  Administered 2012-07-08: 324 mg via ORAL

## 2012-07-08 MED ORDER — ASPIRIN 81 MG PO CHEW
CHEWABLE_TABLET | ORAL | Status: AC
  Filled 2012-07-08: qty 4

## 2012-07-08 MED ORDER — HEPARIN BOLUS VIA INFUSION
1500.0000 [IU] | Freq: Once | INTRAVENOUS | Status: AC
  Administered 2012-07-08: 1500 [IU] via INTRAVENOUS
  Filled 2012-07-08: qty 1500

## 2012-07-08 MED ORDER — METOPROLOL TARTRATE 12.5 MG HALF TABLET
12.5000 mg | ORAL_TABLET | Freq: Two times a day (BID) | ORAL | Status: DC
  Administered 2012-07-08 – 2012-07-10 (×5): 12.5 mg via ORAL
  Filled 2012-07-08 (×6): qty 1

## 2012-07-08 NOTE — Progress Notes (Signed)
ANTICOAGULATION CONSULT NOTE - Follow Up Consult  Pharmacy Consult for heparin  Indication: chest pain/ACS  No Known Allergies  Patient Measurements: Height: 5\' 9"  (175.3 cm) Weight: 229 lb 0.9 oz (103.9 kg) IBW/kg (Calculated) : 70.7 Heparin Dosing Weight: 94  Vital Signs: Temp: 98.1 F (36.7 C) (06/29 0400) Temp src: Oral (06/29 0400) BP: 118/83 mmHg (06/29 0400) Pulse Rate: 69 (06/29 0400)  Labs:  Recent Labs  07/07/12 1324 07/07/12 1330 07/07/12 1336 07/07/12 2238 07/08/12 0030 07/08/12 0540  HGB 16.1 17.7* 17.3* 15.3  --  15.2  HCT 47.4 52.0 51.0 45.4  --  44.4  PLT 273  --   --  248  --  238  APTT 30  --   --  47*  --   --   LABPROT 12.8  --   --  13.4  --   --   INR 0.98  --   --  1.04  --   --   HEPARINUNFRC  --   --   --  0.11*  --  0.27*  CREATININE 1.35 1.40* 1.30 1.07  --   --   TROPONINI 4.52*  --   --   --  4.34*  --     Estimated Creatinine Clearance: 89.4 ml/min (by C-G formula based on Cr of 1.07).   Medical History: Past Medical History  Diagnosis Date  . Hypertension   . Diabetes mellitus without complication   . Hyperlipidemia     Medications:  Scheduled:  . aspirin EC  81 mg Oral Daily  . atorvastatin  40 mg Oral Daily  . [START ON 07/09/2012] diazepam  2 mg Oral On Call  . gabapentin  300 mg Oral TID  . glipiZIDE  10 mg Oral BID AC  . lisinopril  2.5 mg Oral Daily  . nystatin  500,000 Units Oral QID  . sodium chloride  3 mL Intravenous Q12H  . [START ON 07/11/2012] Vitamin D (Ergocalciferol)  50,000 Units Oral Custom    Assessment: 57 yo male admitted with NSTEMI after cocaine use on IV heparin. Heparin level (0.27) is below-goal, but trending up on 1750 units/hr. No problem with line / infusion and no bleeding per RN.   Goal of Therapy:  Heparin level 0.3-0.7 units/ml Monitor platelets by anticoagulation protocol: Yes   Plan:  1. Heparin 1500 unit IV bolus, then increase IV infusion to 1950 units/hr.  2. Heparin level in 6  hours.  3. Daily CBC, heparin level   Emeline Gins 07/08/2012,6:26 AM

## 2012-07-08 NOTE — CV Procedure (Signed)
PROCEDURE:  Left heart catheterization with selective coronary angiography, left ventriculogram.  PCI LAD, aspiration thrombectomy of the LAD.  INDICATIONS:  Transient anterior ST elevation which resolved, NSTEMI with refractory chest pain  The risks, benefits, and details of the procedure were explained to the patient.  The patient verbalized understanding and wanted to proceed.  Informed written consent was obtained.  PROCEDURE TECHNIQUE:  After Xylocaine anesthesia a 10F sheath was placed in the right femoral artery with a single anterior needle wall stick.   Left coronary angiography was done using a Judkins L4 guide catheter.  Right coronary angiography was done using a Judkins R4 guide catheter.  Left ventriculography was done using a pigtail catheter.    CONTRAST:  Total of 155 cc.  COMPLICATIONS:  None.    HEMODYNAMICS:  Aortic pressure was 130/88; LV pressure was 134/15; LVEDP 28.  There was no gradient between the left ventricle and aorta.    ANGIOGRAPHIC DATA:   The left main coronary artery is widely patent.  The left anterior descending artery is a large vessel proximally.  There is an ostial 25% stenosis followed by an area of ectasia.  In the mid vessel, there is a thrombotic 99% area which appears to be a ruptured plaque.  There is a medium-sized diagonal which comes off after this lesion which is patent.  After the diagonal, there is a 80% lesion which appears to be more stable.  In the mid to distal LAD, there are mild luminal irregularities.  The left circumflex artery is a large vessel with mild irregularities proximally and distally.  The OM1 is a medium to large size vessel which appears patent. The OM2 has sequential 80% and 90% lesions.  It is a medium-size vessel.  The right coronary artery is medium-sized dominant vessel.  There is moderate diffuse disease, up to 50% in the mid to distal vessel.  LEFT VENTRICULOGRAM:  Left ventricular angiogram was done in the 30 RAO  projection and revealed moderately reduced systolic function with an estimated ejection fraction of 40 %.  LVEDP was 28 mmHg.  There is distal anterior and apical hypokinesis of severe degree.  PCI NARRATIVE:  A CLS 3.0 guide catheter was used to engage the left main.  Angiomax was used for anticoagulation.  An ACT was used to confirm that the Angiomax is therapeutic.  Initially, a pro-water wire was placed into a branch off of the circumflex.  The guide catheter when into the circumflex system preferentially.  The wire was placed and the guide was pulled back.  A BMW wire was then directed into the LAD with the guide just outside the left main.  The wire was advanced into the distal LAD.  The guide catheter was advanced into the left main.  A pro-water wire was removed.  A Pronto aspiration catheter was used to perform aspiration thrombectomy, which was successful.  A 2.5 x 15 balloon was used to dilate both areas of stenosis in the mid LAD.  A 2.75 x 33 expedition drug-eluting stent was then deployed across the entire area disease.  The stent was postdilated with a 3.25 post dilatation balloon with several inflations.  There is an excellent angiographic result with no residual stenosis.  The mild disease in the ostial LAD appeared stable postintervention.  IMPRESSIONS:  1. Patent left main coronary artery. 2. 99% thrombotic mid left anterior descending artery lesion with sequential 80% lesion.  The entire area was stented with a 2.75 x 33 Expedition stent,  postdilated to 3.4 mm proximall 3. Mild disease in the  left circumflex artery.  OM 2 with sequential 80% and 90% lesions, medium sized vessel. 4. Moderate diffuse disease in the right coronary artery.  Small to Medium sized PDA with disease ranging from 50-70%. 5. Moderately reduced left ventricular systolic function.  LVEDP 28 mmHg.  Ejection fraction 40%.  RECOMMENDATION:  Continue aggressive risk factor modification including smoking cessation.   We'll hold ACE inhibitor or doubt this given his blood pressure is a little bit low and he received a contrast load.  We'll add beta blocker.  If his creatinine remains stable in the next day or so, can restart lisinopril.  Dual antiplatelet therapy for at least a year.  He would be a reasonable candidate for Effient as well and he can be switched to this medication if he prefers the once daily medicine.  Watch for signs of CHF.  Hopefully he will have significant improvement of his apical LV function.

## 2012-07-08 NOTE — Progress Notes (Signed)
Transported via bed with oxygen, monitor, & 2 nurses to cath lab.

## 2012-07-08 NOTE — Progress Notes (Signed)
Pt transferred to cath lab via telemetry, oxygen, and RN x 2; sig other remains in pt room

## 2012-07-08 NOTE — Progress Notes (Signed)
CRITICAL VALUE ALERT  Critical value received:  Troponin = 4.34  Date of notification:  07/08/2012  Time of notification:  0126  Critical value read back:yes  Nurse who received alert:  G. Mayford Knife RN  MD notified (1st page):  Not called; previous value elevated; now trending downwards  Time of first page:  Not called  MD notified (2nd page):  Time of second page:  Responding MD:  Not called  Time MD responded:  Not called

## 2012-07-08 NOTE — Progress Notes (Signed)
SUBJECTIVE:  Patient just started having 5/10 CP with no improvement in intensity with SL NTG.  OBJECTIVE:   Vitals:   Filed Vitals:   07/07/12 2000 07/08/12 0000 07/08/12 0011 07/08/12 0400  BP: 133/86 114/61  118/83  Pulse: 79 69  69  Temp:   98.4 F (36.9 C) 98.1 F (36.7 C)  TempSrc:   Oral Oral  Resp: 26 20  26   Height:      Weight:      SpO2: 96% 94%  95%   I&O's:   Intake/Output Summary (Last 24 hours) at 07/08/12 1610 Last data filed at 07/08/12 0515  Gross per 24 hour  Intake 1428.3 ml  Output    575 ml  Net  853.3 ml   TELEMETRY: Reviewed telemetry pt in NSR:     PHYSICAL EXAM General: Well developed, well nourished, in no acute distress Head: Eyes PERRLA, No xanthomas.   Normal cephalic and atramatic  Lungs:   Clear bilaterally to auscultation and percussion. Heart:   HRRR S1 S2 Pulses are 2+ & equal. Abdomen: Bowel sounds are positive, abdomen soft and non-tender without masses  Extremities:   No clubbing, cyanosis or edema.  DP +1 Neuro: Alert and oriented X 3. Psych:  Good affect, responds appropriately   LABS: Basic Metabolic Panel:  Recent Labs  96/04/54 2238 07/08/12 0539  NA 133* 135  K 3.1* 3.7  CL 97 101  CO2 23 23  GLUCOSE 223* 170*  BUN 18 16  CREATININE 1.07 1.23  CALCIUM 8.4 8.4   Liver Function Tests:  Recent Labs  07/07/12 1324 07/07/12 2238  AST 33 30  ALT 16 14  ALKPHOS 111 99  BILITOT 0.2* 0.2*  PROT 8.0 6.8  ALBUMIN 3.8 3.1*    Recent Labs  07/07/12 1324  LIPASE 34   CBC:  Recent Labs  07/07/12 1324  07/07/12 2238 07/08/12 0540  WBC 13.6*  --  11.9* 12.7*  NEUTROABS 10.8*  --  8.4*  --   HGB 16.1  < > 15.3 15.2  HCT 47.4  < > 45.4 44.4  MCV 79.3  --  78.7 78.6  PLT 273  --  248 238  < > = values in this interval not displayed. Cardiac Enzymes:  Recent Labs  07/07/12 1324 07/08/12 0030 07/08/12 0539  TROPONINI 4.52* 4.34* 4.41*   BNP: No components found with this basename: POCBNP,   D-Dimer:  Recent Labs  07/07/12 1324  DDIMER 0.48   Hemoglobin A1C: No results found for this basename: HGBA1C,  in the last 72 hours Fasting Lipid Panel:  Recent Labs  07/08/12 0539  CHOL 155  HDL 25*  LDLCALC 101*  TRIG 146  CHOLHDL 6.2   Thyroid Function Tests: No results found for this basename: TSH, T4TOTAL, FREET3, T3FREE, THYROIDAB,  in the last 72 hours Anemia Panel: No results found for this basename: VITAMINB12, FOLATE, FERRITIN, TIBC, IRON, RETICCTPCT,  in the last 72 hours Coag Panel:   Lab Results  Component Value Date   INR 1.04 07/07/2012   INR 0.98 07/07/2012    RADIOLOGY: Dg Chest Port 1 View  07/07/2012   *RADIOLOGY REPORT*  Clinical Data: Chest pain  PORTABLE CHEST - 1 VIEW  Comparison: 11/21/2009  Findings: Low lung volumes.  Lungs are grossly clear.  Mild cardiomegaly.  Normal vascularity.  No pneumothorax.  IMPRESSION: Cardiomegaly without decompensation.   Original Report Authenticated By: Jolaine Click, M.D.   ASSESSMENT:  1. Acute NSTEMI after cocaine  use - now with recurrent 2. Polysubstance abuse with cocaine and Tobacco  3. DM  4. HTN  5. dyslipidemia  6.  Hypotension after SL NTG PLAN:  1. Will take to cath lab now for emergent cath due to recurrent CP in setting of Troponin that is now increasing again with very abnormal EKG - cannot start IV NTG due to hypotension.  Not on beta blockers due to recent cocaine use. Cardiac catheterization was discussed with the patient fully including risks on myocardial infarction, death, stroke, bleeding, arrhythmia, dye allergy, renal insufficiency or bleeding. All patient questions and concerns were discussed and the patient understands and is willing to proceed.        Quintella Reichert, MD  07/08/2012  6:33 AM

## 2012-07-09 ENCOUNTER — Encounter (HOSPITAL_COMMUNITY)
Admission: EM | Disposition: A | Discharge status: Home / Self Care | Payer: Self-pay | Source: Home / Self Care | Attending: Cardiology

## 2012-07-09 LAB — GLUCOSE, CAPILLARY
Glucose-Capillary: 143 mg/dL — ABNORMAL HIGH (ref 70–99)
Glucose-Capillary: 164 mg/dL — ABNORMAL HIGH (ref 70–99)
Glucose-Capillary: 135 mg/dL — ABNORMAL HIGH (ref 70–99)
Glucose-Capillary: 146 mg/dL — ABNORMAL HIGH (ref 70–99)

## 2012-07-09 LAB — BASIC METABOLIC PANEL
Calcium: 8.3 mg/dL — ABNORMAL LOW (ref 8.4–10.5)
GFR calc Af Amer: 81 mL/min — ABNORMAL LOW (ref >90)
GFR calc non Af Amer: 70 mL/min — ABNORMAL LOW (ref >90)
Sodium: 134 mEq/L — ABNORMAL LOW (ref 135–145)

## 2012-07-09 LAB — CBC
MCH: 26.4 pg (ref 26.0–34.0)
Platelets: 228 10*3/uL (ref 150–400)
RBC: 5.22 MIL/uL (ref 4.22–5.81)
WBC: 10.5 10*3/uL (ref 4.0–10.5)

## 2012-07-09 LAB — POCT ACTIVATED CLOTTING TIME: Activated Clotting Time: 503 seconds

## 2012-07-09 SURGERY — LEFT HEART CATHETERIZATION WITH CORONARY ANGIOGRAM
Anesthesia: LOCAL

## 2012-07-09 MED ORDER — MAGNESIUM HYDROXIDE 400 MG/5ML PO SUSP
30.0000 mL | Freq: Every day | ORAL | Status: DC | PRN
  Administered 2012-07-09: 30 mL via ORAL
  Filled 2012-07-09: qty 30

## 2012-07-09 MED ORDER — ISOSORBIDE MONONITRATE ER 30 MG PO TB24
30.0000 mg | ORAL_TABLET | Freq: Every day | ORAL | Status: DC
  Administered 2012-07-09 – 2012-07-10 (×2): 30 mg via ORAL
  Filled 2012-07-09 (×2): qty 1

## 2012-07-09 MED ORDER — BUPROPION HCL ER (SR) 150 MG PO TB12
150.0000 mg | ORAL_TABLET | Freq: Two times a day (BID) | ORAL | Status: DC
  Administered 2012-07-09 – 2012-07-10 (×2): 150 mg via ORAL
  Filled 2012-07-09 (×5): qty 1

## 2012-07-09 MED FILL — Sodium Chloride IV Soln 0.9%: INTRAVENOUS | Qty: 50 | Status: AC

## 2012-07-09 NOTE — Care Management Note (Signed)
    Page 1 of 1   07/09/2012     12:23:37 PM   CARE MANAGEMENT NOTE 07/09/2012  Patient:  [name], [name]   Account Number:  [id number]  Date Initiated:  07/09/2012  Documentation initiated by:  Junius Creamer  Subjective/Objective Assessment:   adm w mi     Action/Plan:   lives w fam   Anticipated DC Date:     Anticipated DC Plan:        DC Planning Services  CM consult  Medication Assistance      Choice offered to / List presented to:             Status of service:   Medicare Important Message given?   (If response is "NO", the following Medicare IM given date fields will be blank) Date Medicare IM given:   Date Additional Medicare IM given:    Discharge Disposition:    Per UR Regulation:  Reviewed for med. necessity/level of care/duration of stay  If discussed at Long Length of Stay Meetings, dates discussed:    Comments:  6/30 1222p debbie Tanise Russman rn,bsn card rehab rep gave pt brilinta 30day free and copay assist card.

## 2012-07-09 NOTE — Progress Notes (Signed)
Received patient from 2900. Alert and oriented, not in any distress, VSS oriented to unit and staff.call light within reach.

## 2012-07-09 NOTE — Progress Notes (Addendum)
CARDIAC REHAB PHASE I   PRE:  Rate/Rhythm: 71 SR  BP:  Sitting: 121/71    MODE:  Ambulation: 700 ft   POST:  Rate/Rhythm: 81 SR  BP:  Sitting: 131/77   9:10am-10:10am Patient walked very well.  He walked without any assistance.  He stated that he could not go any further because of his knee and back. He thinks that he may be able to go longer if he had a cane.   Theresa Duty, [place] 07/09/2012 9:58 AM

## 2012-07-09 NOTE — Progress Notes (Signed)
SUBJECTIVE:  No further CP.  Complains of boil in groin area  OBJECTIVE:   Vitals:   Filed Vitals:   07/09/12 0315 07/09/12 0745 07/09/12 0958 07/09/12 1215  BP: 115/66 105/70 131/77 118/66  Pulse: 61 57 67 71  Temp: 98.6 F (37 C) 98.3 F (36.8 C)  98.6 F (37 C)  TempSrc: Oral Oral  Oral  Resp: 19 18  17   Height:      Weight:      SpO2: 96% 96%  95%   I&O's:   Intake/Output Summary (Last 24 hours) at 07/09/12 1630 Last data filed at 07/09/12 0500  Gross per 24 hour  Intake    550 ml  Output    800 ml  Net   -250 ml   TELEMETRY: Reviewed telemetry pt in NSR:     PHYSICAL EXAM General: Well developed, well nourished, in no acute distress Head: Eyes PERRLA, No xanthomas.   Normal cephalic and atramatic  Lungs:   Clear bilaterally to auscultation and percussion. Heart:   HRRR S1 S2 Pulses are 2+ & equal. Abdomen: Bowel sounds are positive, abdomen soft and non-tender without masses  Extremities:   No clubbing, cyanosis or edema.  DP +1, small ulcer in scrotal area Neuro: Alert and oriented X 3. Psych:  Good affect, responds appropriately   LABS: Basic Metabolic Panel:  Recent Labs  16/10/96 0539 07/09/12 0510  NA 135 134*  K 3.7 3.7  CL 101 102  CO2 23 25  GLUCOSE 170* 178*  BUN 16 14  CREATININE 1.23 1.13  CALCIUM 8.4 8.3*   Liver Function Tests:  Recent Labs  07/07/12 1324 07/07/12 2238  AST 33 30  ALT 16 14  ALKPHOS 111 99  BILITOT 0.2* 0.2*  PROT 8.0 6.8  ALBUMIN 3.8 3.1*    Recent Labs  07/07/12 1324  LIPASE 34   CBC:  Recent Labs  07/07/12 1324  07/07/12 2238 07/08/12 0540 07/09/12 0510  WBC 13.6*  --  11.9* 12.7* 10.5  NEUTROABS 10.8*  --  8.4*  --   --   HGB 16.1  < > 15.3 15.2 13.8  HCT 47.4  < > 45.4 44.4 41.2  MCV 79.3  --  78.7 78.6 78.9  PLT 273  --  248 238 228  < > = values in this interval not displayed. Cardiac Enzymes:  Recent Labs  07/08/12 0030 07/08/12 0539 07/08/12 1108  TROPONINI 4.34* 4.41* 2.11*    BNP: No components found with this basename: POCBNP,  D-Dimer:  Recent Labs  07/07/12 1324  DDIMER 0.48   Hemoglobin A1C:  Recent Labs  07/07/12 2238  HGBA1C 8.5*   Fasting Lipid Panel:  Recent Labs  07/08/12 0539  CHOL 155  HDL 25*  LDLCALC 101*  TRIG 146  CHOLHDL 6.2   Thyroid Function Tests: No results found for this basename: TSH, T4TOTAL, FREET3, T3FREE, THYROIDAB,  in the last 72 hours Anemia Panel: No results found for this basename: VITAMINB12, FOLATE, FERRITIN, TIBC, IRON, RETICCTPCT,  in the last 72 hours Coag Panel:   Lab Results  Component Value Date   INR 1.04 07/07/2012   INR 0.98 07/07/2012    RADIOLOGY: Dg Chest Port 1 View  07/07/2012   *RADIOLOGY REPORT*  Clinical Data: Chest pain  PORTABLE CHEST - 1 VIEW  Comparison: 11/21/2009  Findings: Low lung volumes.  Lungs are grossly clear.  Mild cardiomegaly.  Normal vascularity.  No pneumothorax.  IMPRESSION: Cardiomegaly without decompensation.  Original Report Authenticated By: Jolaine Click, M.D.   ASSESSMENT:  1. Acute NSTEMI after cocaine use -cath showed 99% thrombotic mid LAD with sequential 80% stenosis s/p DES to LAD, OM2 with sequential 80-90% stenosis, moderate diffuse RCA disease with 50-70% stenosis in a small to medium PDA.  Moderate LV dysfunction EF 40%. Increased LVEDP c/w diastolic dysfunction 2. Polysubstance abuse with cocaine and Tobacco  3. DM  4. HTN  5. dyslipidemia  6.  Small folliculits vs boil in scrotal area  PLAN:  1. Transfer to tele bed 2.  Continue ASA/statin/beta blocker 3.  Add Imdur 30mg  daily 4.  Wound care team for boil to groin 5.  Wellbutrin for smoking cessation        Quintella Reichert, MD  07/09/2012  4:30 PM

## 2012-07-09 NOTE — Progress Notes (Signed)
Inpatient Diabetes Program Recommendations  AACE/ADA: New Consensus Statement on Inpatient Glycemic Control (2013)  Target Ranges:  Prepandial:   less than 140 mg/dL      Peak postprandial:   less than 180 mg/dL (1-2 hours)      Critically ill patients:  140 - 180 mg/dL   Inpatient Diabetes Program Recommendations Correction (SSI): start sensitive scale TID Thank you  Piedad Climes BSN, RN,CDE Inpatient Diabetes Coordinator [phone] (team pager)

## 2012-07-10 LAB — BASIC METABOLIC PANEL
BUN: 14 mg/dL (ref 6–23)
Chloride: 102 mEq/L (ref 96–112)
Creatinine, Ser: 1.01 mg/dL (ref 0.50–1.35)
GFR calc Af Amer: >90 mL/min (ref >90)
GFR calc non Af Amer: 80 mL/min — ABNORMAL LOW (ref >90)

## 2012-07-10 LAB — CBC
HCT: 38.2 % — ABNORMAL LOW (ref 39.0–52.0)
MCHC: 33.2 g/dL (ref 30.0–36.0)
Platelets: 218 10*3/uL (ref 150–400)
RDW: 15.2 % (ref 11.5–15.5)
WBC: 10.6 10*3/uL — ABNORMAL HIGH (ref 4.0–10.5)

## 2012-07-10 LAB — GLUCOSE, CAPILLARY: Glucose-Capillary: 171 mg/dL — ABNORMAL HIGH (ref 70–99)

## 2012-07-10 MED ORDER — ASPIRIN 81 MG PO CHEW: 81.0000 mg | CHEWABLE_TABLET | Freq: Every day | ORAL | Status: DC

## 2012-07-10 MED ORDER — ATORVASTATIN CALCIUM 80 MG PO TABS: 80.0000 mg | ORAL_TABLET | Freq: Every day | ORAL | Status: DC

## 2012-07-10 MED ORDER — METOPROLOL TARTRATE 25 MG PO TABS: ORAL_TABLET | ORAL | Status: DC

## 2012-07-10 MED ORDER — ISOSORBIDE MONONITRATE ER 30 MG PO TB24: 30.0000 mg | ORAL_TABLET | Freq: Every day | ORAL | Status: DC

## 2012-07-10 MED ORDER — NITROGLYCERIN 0.4 MG SL SUBL: 0.4000 mg | SUBLINGUAL_TABLET | SUBLINGUAL | Status: DC | PRN

## 2012-07-10 MED ORDER — LISINOPRIL 5 MG PO TABS: 5.0000 mg | ORAL_TABLET | Freq: Every day | ORAL | Status: DC

## 2012-07-10 MED ORDER — TICAGRELOR 90 MG PO TABS: 90.0000 mg | ORAL_TABLET | Freq: Two times a day (BID) | ORAL | Status: DC

## 2012-07-10 MED ORDER — METOPROLOL TARTRATE 12.5 MG HALF TABLET: 12.5000 mg | ORAL_TABLET | Freq: Two times a day (BID) | ORAL | Status: DC

## 2012-07-10 MED ORDER — METFORMIN HCL 1000 MG PO TABS: 1000.0000 mg | ORAL_TABLET | Freq: Two times a day (BID) | ORAL | Status: DC

## 2012-07-10 MED ORDER — BUPROPION HCL ER (SR) 150 MG PO TB12: 150.0000 mg | ORAL_TABLET | Freq: Two times a day (BID) | ORAL | Status: DC

## 2012-07-10 NOTE — Discharge Summary (Addendum)
Patient ID: [name] MRN: [id number] DOB/AGE: Apr 09, 1955 57 y.o.  Admit date: 07/07/2012 Discharge date: 07/10/2012  Primary Discharge Diagnosis  NSTEMI Secondary Discharge Diagnosis  ASCAD with 99% thrombotic mid LAD with sequential 80% stenosis s/p DES to LAD,   OM2 with sequential 80-90% stenosis, moderate diffuse RCA disease with   50-70% stenosis in a small to medium PDA  Moderate LV dysfunction EF 40%  Diastolic dysfunction  Polysubstance abuse with cocaine and Tobacco  DM  HTN  Dyslipidemia  Obesity     Significant Diagnostic Studies: PROCEDURE: Left heart catheterization with selective coronary angiography, left ventriculogram. PCI LAD, aspiration thrombectomy of the LAD.  INDICATIONS: Transient anterior ST elevation which resolved, NSTEMI with refractory chest pain  The risks, benefits, and details of the procedure were explained to the patient. The patient verbalized understanding and wanted to proceed. Informed written consent was obtained.  PROCEDURE TECHNIQUE: After Xylocaine anesthesia a 15F sheath was placed in the right femoral artery with a single anterior needle wall stick. Left coronary angiography was done using a Judkins L4 guide catheter. Right coronary angiography was done using a Judkins R4 guide catheter. Left ventriculography was done using a pigtail catheter.  CONTRAST: Total of 155 cc.  COMPLICATIONS: None.  HEMODYNAMICS: Aortic pressure was 130/88; LV pressure was 134/15; LVEDP 28. There was no gradient between the left ventricle and aorta.  ANGIOGRAPHIC DATA: The left main coronary artery is widely patent.  The left anterior descending artery is a large vessel proximally. There is an ostial 25% stenosis followed by an area of ectasia. In the mid vessel, there is a thrombotic 99% area which appears to be a ruptured plaque. There is a medium-sized diagonal which comes off after this lesion which is patent. After the diagonal, there is a 80% lesion which  appears to be more stable. In the mid to distal LAD, there are mild luminal irregularities.  The left circumflex artery is a large vessel with mild irregularities proximally and distally. The OM1 is a medium to large size vessel which appears patent. The OM2 has sequential 80% and 90% lesions. It is a medium-size vessel.  The right coronary artery is medium-sized dominant vessel. There is moderate diffuse disease, up to 50% in the mid to distal vessel.  LEFT VENTRICULOGRAM: Left ventricular angiogram was done in the 30 RAO projection and revealed moderately reduced systolic function with an estimated ejection fraction of 40 %. LVEDP was 28 mmHg. There is distal anterior and apical hypokinesis of severe degree.  PCI NARRATIVE: A CLS 3.0 guide catheter was used to engage the left main. Angiomax was used for anticoagulation. An ACT was used to confirm that the Angiomax is therapeutic. Initially, a pro-water wire was placed into a branch off of the circumflex. The guide catheter when into the circumflex system preferentially. The wire was placed and the guide was pulled back. A BMW wire was then directed into the LAD with the guide just outside the left main. The wire was advanced into the distal LAD. The guide catheter was advanced into the left main. A pro-water wire was removed. A Pronto aspiration catheter was used to perform aspiration thrombectomy, which was successful. A 2.5 x 15 balloon was used to dilate both areas of stenosis in the mid LAD. A 2.75 x 33 expedition drug-eluting stent was then deployed across the entire area disease. The stent was postdilated with a 3.25 post dilatation balloon with several inflations. There is an excellent angiographic result with no residual  stenosis. The mild disease in the ostial LAD appeared stable postintervention.  IMPRESSIONS:  1. Patent left main coronary artery. 2. 99% thrombotic mid left anterior descending artery lesion with sequential 80% lesion. The entire  area was stented with a 2.75 x 33 Expedition stent, postdilated to 3.4 mm proximall 3. Mild disease in the left circumflex artery. OM 2 with sequential 80% and 90% lesions, medium sized vessel. 4. Moderate diffuse disease in the right coronary artery. Small to Medium sized PDA with disease ranging from 50-70%. 5. Moderately reduced left ventricular systolic function. LVEDP 28 mmHg. Ejection fraction 40%.  Consults: None Hospital Course: This is a 57yo AAM with a history of DM, HTN and dyslipidemia who was in his USOH until day before admission around 12Noon. He says that he had eaten some [employer] and started having chest discomfort in the midsternal region and felt like he had to belch but couldn't. It radiated into his back. This was off and on and improved after eating popcorn. The next am it started back with SOB and diaphoresis with dizziness and went to the ER. In the ER he was given SL NTG with improvement in his discomfort but did not make it go away. When he got to the ER he stood up to change beds and belched and the discomfort went away. He ruled in for NSTEMI and was transferred to [facility].  He was started on IV Heparin gtt.  He had recurrent CP on Sunday 6/29 and went emergently to the cath lab where he was found to have a 99% thrombotic LAD with sequential 80% lesions and this was stented with a DES stent.  He was also found to have sequential 80% and 90% stenosis in the OM2, moderate diffuse disease in the RCA and 50-70% stenosis in the PDA.  LV dysfunction was noted to a moderate degree with elevated LVEDP and EF 40%.  He was started on Brilinta, beta blocker and ACE Inhibitor.  He did well after cath with no further CP.  He was started in Cardiac rehab.  He did complain of a sore hair follicle in his groin which appeared to be folliculitis.  He was discharged to home in stable condition and was instructed to followup with his primary MD if folliculitis does not resolve with warm  compresses.   Discharge Exam: Blood pressure 102/49, pulse 63, temperature 97.8 F (36.6 C), temperature source Oral, resp. rate 20, height 5\' 9"  (1.753 m), weight 103.9 kg (229 lb 0.9 oz), SpO2 94.00%.   General appearance: alert Resp: clear to auscultation bilaterally Cardio: regular rate and rhythm, S1, S2 normal, no murmur, click, rub or gallop GI: soft, non-tender; bowel sounds normal; no masses,  no organomegaly Extremities: extremities normal, atraumatic, no cyanosis or edema Labs:   Lab Results  Component Value Date   WBC 10.6* 07/10/2012   HGB 12.7* 07/10/2012   HCT 38.2* 07/10/2012   MCV 78.8 07/10/2012   PLT 218 07/10/2012    Recent Labs Lab 07/07/12 2238  07/10/12 0424  NA 133*  < > 135  K 3.1*  < > 4.0  CL 97  < > 102  CO2 23  < > 26  BUN 18  < > 14  CREATININE 1.07  < > 1.01  CALCIUM 8.4  < > 8.7  PROT 6.8  --   --   BILITOT 0.2*  --   --   ALKPHOS 99  --   --   ALT 14  --   --  AST 30  --   --   GLUCOSE 223*  < > 177*  < > = values in this interval not displayed. Lab Results  Component Value Date   CKTOTAL 74 11/21/2009   CKMB 0.8 11/21/2009   TROPONINI 2.11* 07/08/2012    Lab Results  Component Value Date   CHOL 155 07/08/2012   Lab Results  Component Value Date   HDL 25* 07/08/2012   Lab Results  Component Value Date   LDLCALC 101* 07/08/2012   Lab Results  Component Value Date   TRIG 146 07/08/2012   Lab Results  Component Value Date   CHOLHDL 6.2 07/08/2012   No results found for this basename: LDLDIRECT      Radiology:  *RADIOLOGY REPORT*  Clinical Data: Chest pain  PORTABLE CHEST - 1 VIEW  Comparison: 11/21/2009  Findings: Low lung volumes. Lungs are grossly clear. Mild  cardiomegaly. Normal vascularity. No pneumothorax.  IMPRESSION:  Cardiomegaly without decompensation.  Original Report Authenticated By: Jolaine Click, M.D.   EKG: NSR with deeply inverted T waves in the anterior and lateral leads  FOLLOW UP PLANS AND  APPOINTMENTS Discharge Orders   Future Orders Complete By Expires     Diet - low sodium heart healthy  As directed     Driving Restrictions  As directed     Comments:      No driving for 2 weeks    Increase activity slowly  As directed     Lifting restrictions  As directed     Comments:      No heavy lifting more than 10 pounds for 1 week        Medication List    STOP taking these medications       naproxen 500 MG tablet  Commonly known as:  NAPROSYN      TAKE these medications       aspirin 81 MG chewable tablet  Chew 1 tablet (81 mg total) by mouth daily.     atorvastatin 80 MG tablet  Commonly known as:  LIPITOR  Take 80 mg by mouth daily.     buPROPion 150 MG 12 hr tablet  Commonly known as:  WELLBUTRIN SR  Take 1 tablet (150 mg total) by mouth 2 (two) times daily.     gabapentin 600 MG tablet  Commonly known as:  NEURONTIN  Take 300 mg by mouth 3 (three) times daily.     glipiZIDE 5 MG tablet  Commonly known as:  GLUCOTROL  Take 10 mg by mouth 2 (two) times daily before a meal.     HYDROcodone-acetaminophen 5-325 MG per tablet  Commonly known as:  NORCO/VICODIN  Take 1 tablet by mouth 2 (two) times daily as needed for pain.     isosorbide mononitrate 30 MG 24 hr tablet  Commonly known as:  IMDUR  Take 1 tablet (30 mg total) by mouth daily.     lisinopril 5 MG tablet  Commonly known as:  PRINIVIL,ZESTRIL  Take 5 mg by mouth daily.     metFORMIN 1000 MG tablet  Commonly known as:  GLUCOPHAGE  Take 1 tablet (1,000 mg total) by mouth 2 (two) times daily with a meal. Do not restart Metformin until Wednesday am 7/2     metoprolol tartrate 25 mg Tabs  Commonly known as:  LOPRESSOR  Take 0.5 tablets (12.5 mg total) by mouth 2 (two) times daily.     nitroGLYCERIN 0.4 MG SL tablet  Commonly known as:  NITROSTAT  Place 1 tablet (0.4 mg total) under the tongue every 5 (five) minutes x 3 doses as needed for chest pain.     nystatin 100000 UNIT/ML suspension   Commonly known as:  MYCOSTATIN  Take 500,000 Units by mouth 4 (four) times daily.     Ticagrelor 90 MG Tabs tablet  Commonly known as:  BRILINTA  Take 1 tablet (90 mg total) by mouth 2 (two) times daily.     Vitamin D (Ergocalciferol) 50000 UNITS Caps  Commonly known as:  DRISDOL  Take 50,000 Units by mouth 2 (two) times a week. Takes on Wednesday and Saturday.           Follow-up Information   Follow up with Quintella Reichert, MD On 07/27/2012. (at 2:15pm)    Contact information:   [address] [place] [address] [phone]       BRING ALL MEDICATIONS WITH YOU TO FOLLOW UP APPOINTMENTS  Time spent with patient to include physician time:40 minutes Signed: TURNER,TRACI R 07/10/2012, 9:35 AM

## 2012-07-14 ENCOUNTER — Inpatient Hospital Stay (HOSPITAL_COMMUNITY)
Admission: EM | Admit: 2012-07-14 | Discharge: 2012-07-16 | DRG: 303 | Disposition: A | Discharge status: Home / Self Care | Payer: Medicare Other | Attending: Cardiology | Admitting: Cardiology

## 2012-07-14 ENCOUNTER — Encounter (HOSPITAL_COMMUNITY): Payer: Self-pay | Admitting: *Deleted

## 2012-07-14 ENCOUNTER — Emergency Department (HOSPITAL_COMMUNITY): Payer: Medicare Other

## 2012-07-14 DIAGNOSIS — Z9861 Coronary angioplasty status: Secondary | ICD-10-CM

## 2012-07-14 DIAGNOSIS — I10 Essential (primary) hypertension: Secondary | ICD-10-CM

## 2012-07-14 DIAGNOSIS — I200 Unstable angina: Secondary | ICD-10-CM | POA: Diagnosis present

## 2012-07-14 DIAGNOSIS — E119 Type 2 diabetes mellitus without complications: Secondary | ICD-10-CM

## 2012-07-14 DIAGNOSIS — R079 Chest pain, unspecified: Secondary | ICD-10-CM

## 2012-07-14 DIAGNOSIS — T463X5A Adverse effect of coronary vasodilators, initial encounter: Secondary | ICD-10-CM | POA: Diagnosis present

## 2012-07-14 DIAGNOSIS — E669 Obesity, unspecified: Secondary | ICD-10-CM | POA: Diagnosis present

## 2012-07-14 DIAGNOSIS — F17200 Nicotine dependence, unspecified, uncomplicated: Secondary | ICD-10-CM | POA: Diagnosis present

## 2012-07-14 DIAGNOSIS — Z6835 Body mass index (BMI) 35.0-35.9, adult: Secondary | ICD-10-CM

## 2012-07-14 DIAGNOSIS — E785 Hyperlipidemia, unspecified: Secondary | ICD-10-CM | POA: Diagnosis present

## 2012-07-14 DIAGNOSIS — I2510 Atherosclerotic heart disease of native coronary artery without angina pectoris: Principal | ICD-10-CM | POA: Diagnosis present

## 2012-07-14 DIAGNOSIS — I2589 Other forms of chronic ischemic heart disease: Secondary | ICD-10-CM | POA: Diagnosis present

## 2012-07-14 DIAGNOSIS — G4440 Drug-induced headache, not elsewhere classified, not intractable: Secondary | ICD-10-CM | POA: Diagnosis present

## 2012-07-14 DIAGNOSIS — Z79899 Other long term (current) drug therapy: Secondary | ICD-10-CM

## 2012-07-14 DIAGNOSIS — Z7982 Long term (current) use of aspirin: Secondary | ICD-10-CM

## 2012-07-14 DIAGNOSIS — I214 Non-ST elevation (NSTEMI) myocardial infarction: Secondary | ICD-10-CM | POA: Diagnosis present

## 2012-07-14 LAB — CBC WITH DIFFERENTIAL/PLATELET
Basophils Absolute: 0.1 10*3/uL (ref 0.0–0.1)
Basophils Relative: 1 % (ref 0–1)
Eosinophils Absolute: 0.8 10*3/uL — ABNORMAL HIGH (ref 0.0–0.7)
Eosinophils Relative: 6 % — ABNORMAL HIGH (ref 0–5)
Lymphs Abs: 2.5 10*3/uL (ref 0.7–4.0)
MCH: 27.1 pg (ref 26.0–34.0)
MCHC: 33.8 g/dL (ref 30.0–36.0)
Neutrophils Relative %: 70 % (ref 43–77)
Platelets: 300 10*3/uL (ref 150–400)
RBC: 5.95 MIL/uL — ABNORMAL HIGH (ref 4.22–5.81)
RDW: 15 % (ref 11.5–15.5)

## 2012-07-14 LAB — GLUCOSE, CAPILLARY: Glucose-Capillary: 111 mg/dL — ABNORMAL HIGH (ref 70–99)

## 2012-07-14 LAB — BASIC METABOLIC PANEL
Calcium: 10.1 mg/dL (ref 8.4–10.5)
GFR calc Af Amer: 58 mL/min — ABNORMAL LOW (ref >90)
GFR calc non Af Amer: 50 mL/min — ABNORMAL LOW (ref >90)
Potassium: 4.5 mEq/L (ref 3.5–5.1)
Sodium: 134 mEq/L — ABNORMAL LOW (ref 135–145)

## 2012-07-14 LAB — CBC
Hemoglobin: 15.2 g/dL (ref 13.0–17.0)
MCHC: 33.5 g/dL (ref 30.0–36.0)
RDW: 15.1 % (ref 11.5–15.5)
WBC: 11.7 10*3/uL — ABNORMAL HIGH (ref 4.0–10.5)

## 2012-07-14 MED ORDER — ASPIRIN EC 81 MG PO TBEC
81.0000 mg | DELAYED_RELEASE_TABLET | Freq: Every day | ORAL | Status: DC
  Administered 2012-07-15 – 2012-07-16 (×2): 81 mg via ORAL
  Filled 2012-07-14 (×2): qty 1

## 2012-07-14 MED ORDER — HYDROCODONE-ACETAMINOPHEN 5-325 MG PO TABS
1.0000 | ORAL_TABLET | Freq: Three times a day (TID) | ORAL | Status: DC | PRN
  Administered 2012-07-15 – 2012-07-16 (×4): 1 via ORAL
  Filled 2012-07-14 (×4): qty 1

## 2012-07-14 MED ORDER — ATORVASTATIN CALCIUM 80 MG PO TABS
80.0000 mg | ORAL_TABLET | Freq: Every day | ORAL | Status: DC
  Administered 2012-07-15 – 2012-07-16 (×2): 80 mg via ORAL
  Filled 2012-07-14 (×2): qty 1

## 2012-07-14 MED ORDER — ENOXAPARIN SODIUM 40 MG/0.4ML ~~LOC~~ SOLN
40.0000 mg | SUBCUTANEOUS | Status: DC
  Filled 2012-07-14 (×3): qty 0.4

## 2012-07-14 MED ORDER — METOPROLOL TARTRATE 12.5 MG HALF TABLET
12.5000 mg | ORAL_TABLET | Freq: Two times a day (BID) | ORAL | Status: DC
  Administered 2012-07-15 – 2012-07-16 (×4): 12.5 mg via ORAL
  Filled 2012-07-14 (×5): qty 1

## 2012-07-14 MED ORDER — TICAGRELOR 90 MG PO TABS
90.0000 mg | ORAL_TABLET | Freq: Two times a day (BID) | ORAL | Status: DC
  Administered 2012-07-14 – 2012-07-16 (×5): 90 mg via ORAL
  Filled 2012-07-14 (×5): qty 1

## 2012-07-14 MED ORDER — SODIUM CHLORIDE 0.9 % IV SOLN
INTRAVENOUS | Status: DC
  Administered 2012-07-14 – 2012-07-16 (×4): via INTRAVENOUS

## 2012-07-14 MED ORDER — ACETAMINOPHEN 325 MG PO TABS
650.0000 mg | ORAL_TABLET | ORAL | Status: DC | PRN
  Filled 2012-07-14: qty 2

## 2012-07-14 MED ORDER — SODIUM CHLORIDE 0.9 % IV SOLN: INTRAVENOUS | Status: DC

## 2012-07-14 MED ORDER — NITROGLYCERIN 0.4 MG SL SUBL
0.4000 mg | SUBLINGUAL_TABLET | SUBLINGUAL | Status: DC | PRN
  Administered 2012-07-14: 0.4 mg via SUBLINGUAL
  Filled 2012-07-14: qty 25

## 2012-07-14 MED ORDER — METFORMIN HCL 500 MG PO TABS: 1000.0000 mg | ORAL_TABLET | Freq: Two times a day (BID) | ORAL | Status: DC

## 2012-07-14 MED ORDER — ASPIRIN 81 MG PO CHEW: 81.0000 mg | CHEWABLE_TABLET | Freq: Every day | ORAL | Status: DC

## 2012-07-14 MED ORDER — GLIPIZIDE 10 MG PO TABS
10.0000 mg | ORAL_TABLET | Freq: Two times a day (BID) | ORAL | Status: DC
  Administered 2012-07-15 (×2): 10 mg via ORAL
  Filled 2012-07-14 (×5): qty 1

## 2012-07-14 MED ORDER — GABAPENTIN 300 MG PO CAPS
300.0000 mg | ORAL_CAPSULE | Freq: Three times a day (TID) | ORAL | Status: DC
  Administered 2012-07-14 – 2012-07-16 (×7): 300 mg via ORAL
  Filled 2012-07-14 (×7): qty 1

## 2012-07-14 MED ORDER — BUPROPION HCL ER (SR) 150 MG PO TB12
150.0000 mg | ORAL_TABLET | Freq: Two times a day (BID) | ORAL | Status: DC
  Administered 2012-07-14 – 2012-07-16 (×5): 150 mg via ORAL
  Filled 2012-07-14 (×6): qty 1

## 2012-07-14 MED ORDER — LISINOPRIL 5 MG PO TABS
5.0000 mg | ORAL_TABLET | Freq: Every day | ORAL | Status: DC
  Administered 2012-07-15 – 2012-07-16 (×2): 5 mg via ORAL
  Filled 2012-07-14 (×2): qty 1

## 2012-07-14 MED ORDER — ASPIRIN 81 MG PO CHEW
324.0000 mg | CHEWABLE_TABLET | Freq: Once | ORAL | Status: AC
  Administered 2012-07-14: 324 mg via ORAL
  Filled 2012-07-14: qty 4

## 2012-07-14 MED ORDER — SODIUM CHLORIDE 0.9 % IV BOLUS (SEPSIS)
250.0000 mL | Freq: Once | INTRAVENOUS | Status: AC
  Administered 2012-07-14: 250 mL via INTRAVENOUS

## 2012-07-14 MED ORDER — NITROGLYCERIN 0.4 MG SL SUBL: 0.4000 mg | SUBLINGUAL_TABLET | SUBLINGUAL | Status: DC | PRN

## 2012-07-14 MED ORDER — ISOSORBIDE MONONITRATE ER 60 MG PO TB24
60.0000 mg | ORAL_TABLET | Freq: Every day | ORAL | Status: DC
  Administered 2012-07-15 – 2012-07-16 (×2): 60 mg via ORAL
  Filled 2012-07-14 (×2): qty 1

## 2012-07-14 MED ORDER — ONDANSETRON HCL 4 MG/2ML IJ SOLN: 4.0000 mg | Freq: Four times a day (QID) | INTRAMUSCULAR | Status: DC | PRN

## 2012-07-14 MED ORDER — MORPHINE SULFATE 2 MG/ML IJ SOLN
2.0000 mg | Freq: Once | INTRAMUSCULAR | Status: AC
  Administered 2012-07-14: 2 mg via INTRAVENOUS
  Filled 2012-07-14: qty 1

## 2012-07-14 MED ORDER — ASPIRIN 81 MG PO CHEW
CHEWABLE_TABLET | ORAL | Status: AC
  Administered 2012-07-14: 18:00:00
  Filled 2012-07-14: qty 1

## 2012-07-14 MED ORDER — ONDANSETRON HCL 4 MG/2ML IJ SOLN
4.0000 mg | Freq: Once | INTRAMUSCULAR | Status: AC
  Administered 2012-07-14: 4 mg via INTRAVENOUS
  Filled 2012-07-14: qty 2

## 2012-07-14 NOTE — ED Notes (Addendum)
[facility] called and received report, ETA 20-25 min.

## 2012-07-14 NOTE — ED Notes (Addendum)
Pt with left sided chest pain while watching tv today about 2 hours ago, denies taking nitro for it, pt also with HA x 3 days

## 2012-07-14 NOTE — Progress Notes (Signed)
PHARMACIST - PHYSICIAN COMMUNICATION DR:   CONCERNING:  METFORMIN SAFE ADMINISTRATION POLICY  RECOMMENDATION: Metformin has been placed on DISCONTINUE (rejected order) STATUS and should be reordered only after any of the conditions below are ruled out.  Current safety recommendations include avoiding metformin for a minimum of 48 hours after the patient's exposure to intravenous contrast media.  DESCRIPTION:  The Pharmacy Committee has adopted a policy that restricts the use of metformin in hospitalized patients until all the contraindications to administration have been ruled out. Specific contraindications are: [x] Serum creatinine ? 1.5 for males [] Serum creatinine ? 1.4 for females [] Shock, acute MI, sepsis, hypoxemia, dehydration [] Planned administration of intravenous iodinated contrast media [] Heart Failure patients with low EF [] Acute or chronic metabolic acidosis (including DKA)      

## 2012-07-14 NOTE — ED Notes (Signed)
MD at bedside. 

## 2012-07-14 NOTE — ED Notes (Signed)
Chest pain began 2 hours PTA. Pain radiated to left arm. CP has subsided but pain remains in the arm. Also states headache all day. Vomited x 1 at the onset of pain. Pt had cardiac stent placed last weekend.

## 2012-07-14 NOTE — H&P (Signed)
[name] is an 57 y.o. male.    Primary Cardiologist: Dr. Armanda Magic  Chief Complaint: Chest pain  HPI: 57 y/o male with a PMH of CAD presenting as a transfer from [facility] where he originally presented for chest pain evaluation.  He was recently admitted to [facility] for NSTEMI.  He had a cardiac cath 07/07/2012 and was found to have 99% thrombotic occlusion of mLAD with sequential 80% stenosis and is s/p DES to LAD.  He also had 80-90% OM2 stenosis as well as moderate diffuse disease in RCA with 50-70% stenosis of PDA that are currently been treated medically; LVEF was 40% by ventriculogram.  Since discharge from hospital, he has been doing well until the day of presentation when he started to complain of chest that he describes as aching pain, 3/10 in severity, radiating to his jaw and arm and associated with nausea.  He states that this pain is similar to the pain he had prior to his stent placement.  His pain was relieved by SL NTG prior to transfer to [facility].  Currently, he is chest pain free and is clinically and hemodynamically stable. His first set of cardiac marker is negative, and he has no new changes on his EKG.  Past Medical History  Diagnosis Date  . Hypertension   . Diabetes mellitus without complication   . Hyperlipidemia     Past Surgical History  Procedure Laterality Date  . Coronary angioplasty with stent placement      No family history on file. Social History:  reports that he has been smoking Cigarettes.  He has been smoking about 0.00 packs per day. He does not have any smokeless tobacco history on file. He reports that he uses illicit drugs (Cocaine). He reports that he does not drink alcohol.  Allergies: No Known Allergies  Medications Prior to Admission  Medication Sig Dispense Refill  . aspirin 81 MG chewable tablet Chew 1 tablet (81 mg total) by mouth daily.      Marland Kitchen atorvastatin (LIPITOR) 80 MG tablet Take 1 tablet (80 mg total)  by mouth daily.  30 tablet  11  . buPROPion (WELLBUTRIN SR) 150 MG 12 hr tablet Take 1 tablet (150 mg total) by mouth 2 (two) times daily.  60 tablet  11  . gabapentin (NEURONTIN) 600 MG tablet Take 300 mg by mouth 3 (three) times daily.      Marland Kitchen glipiZIDE (GLUCOTROL) 5 MG tablet Take 10 mg by mouth 2 (two) times daily before a meal.      . HYDROcodone-acetaminophen (NORCO/VICODIN) 5-325 MG per tablet Take 1 tablet by mouth 3 (three) times daily as needed for pain.       . isosorbide mononitrate (IMDUR) 30 MG 24 hr tablet Take 1 tablet (30 mg total) by mouth daily.  30 tablet  11  . lisinopril (PRINIVIL,ZESTRIL) 5 MG tablet Take 1 tablet (5 mg total) by mouth daily.  30 tablet  11  . metFORMIN (GLUCOPHAGE) 1000 MG tablet Take 1 tablet (1,000 mg total) by mouth 2 (two) times daily with a meal. Do not restart Metformin until Wednesday am 7/2      . metoprolol tartrate (LOPRESSOR) 25 MG tablet Take 12.5 mg by mouth 2 (two) times daily.      . Ticagrelor (BRILINTA) 90 MG TABS tablet Take 1 tablet (90 mg total) by mouth 2 (two) times daily.  60 tablet  11  . nitroGLYCERIN (NITROSTAT) 0.4 MG SL tablet  Place 1 tablet (0.4 mg total) under the tongue every 5 (five) minutes x 3 doses as needed for chest pain.  25 tablet  12  . Vitamin D, Ergocalciferol, (DRISDOL) 50000 UNITS CAPS Take 50,000 Units by mouth 2 (two) times a week. Takes on Wednesday and Saturday.        Results for orders placed during the hospital encounter of 07/14/12 (from the past 48 hour(s))  TROPONIN I     Status: None   Collection Time    07/14/12  6:09 PM      Result Value Range   Troponin I <0.30  <0.30 ng/mL   Comment:            Due to the release kinetics of cTnI,     a negative result within the first hours     of the onset of symptoms does not rule out     myocardial infarction with certainty.     If myocardial infarction is still suspected,     repeat the test at appropriate intervals.  CBC WITH DIFFERENTIAL     Status:  Abnormal   Collection Time    07/14/12  6:09 PM      Result Value Range   WBC 13.3 (*) 4.0 - 10.5 K/uL   RBC 5.95 (*) 4.22 - 5.81 MIL/uL   Hemoglobin 16.1  13.0 - 17.0 g/dL   HCT 16.1  09.6 - 04.5 %   MCV 80.2  78.0 - 100.0 fL   MCH 27.1  26.0 - 34.0 pg   MCHC 33.8  30.0 - 36.0 g/dL   RDW 40.9  81.1 - 91.4 %   Platelets 300  150 - 400 K/uL   Neutrophils Relative % 70  43 - 77 %   Neutro Abs 9.3 (*) 1.7 - 7.7 K/uL   Lymphocytes Relative 19  12 - 46 %   Lymphs Abs 2.5  0.7 - 4.0 K/uL   Monocytes Relative 5  3 - 12 %   Monocytes Absolute 0.7  0.1 - 1.0 K/uL   Eosinophils Relative 6 (*) 0 - 5 %   Eosinophils Absolute 0.8 (*) 0.0 - 0.7 K/uL   Basophils Relative 1  0 - 1 %   Basophils Absolute 0.1  0.0 - 0.1 K/uL  BASIC METABOLIC PANEL     Status: Abnormal   Collection Time    07/14/12  6:09 PM      Result Value Range   Sodium 134 (*) 135 - 145 mEq/L   Potassium 4.5  3.5 - 5.1 mEq/L   Chloride 97  96 - 112 mEq/L   CO2 28  19 - 32 mEq/L   Glucose, Bld 111 (*) 70 - 99 mg/dL   BUN 27 (*) 6 - 23 mg/dL   Creatinine, Ser 7.82 (*) 0.50 - 1.35 mg/dL   Calcium 95.6  8.4 - 21.3 mg/dL   GFR calc non Af Amer 50 (*) >90 mL/min   GFR calc Af Amer 58 (*) >90 mL/min   Comment:            The eGFR has been calculated     using the CKD EPI equation.     This calculation has not been     validated in all clinical     situations.     eGFR's persistently     <90 mL/min signify     possible Chronic Kidney Disease.  GLUCOSE, CAPILLARY     Status: Abnormal   Collection  Time    07/14/12 10:21 PM      Result Value Range   Glucose-Capillary 111 (*) 70 - 99 mg/dL   Comment 1 Notify RN     Dg Chest Portable 1 View  07/14/2012   *RADIOLOGY REPORT*  Clinical Data: Hypertension.  Diabetes.  Chest pain.  PORTABLE CHEST - 1 VIEW  Comparison: 07/07/2012  Findings: Low lung volumes are present, causing crowding of the pulmonary vasculature.  Mild cardiomegaly appears stable.  Mildly prominent upper zone  vasculatures suggest mild pulmonary venous hypertension.  No overt edema.  IMPRESSION:  1.  Stable mild cardiomegaly with suspected pulmonary venous hypertension.  No overt edema.   Original Report Authenticated By: Gaylyn Rong, M.D.    Review of Systems  Constitutional: Negative for fever, chills, weight loss, malaise/fatigue and diaphoresis.  HENT: Positive for neck pain. Negative for hearing loss, ear pain, nosebleeds, congestion, tinnitus and ear discharge.   Eyes: Negative for blurred vision, double vision, photophobia, pain, discharge and redness.  Respiratory: Positive for shortness of breath. Negative for cough, hemoptysis, sputum production, wheezing and stridor.   Cardiovascular: Positive for chest pain. Negative for palpitations, orthopnea, claudication, leg swelling and PND.  Gastrointestinal: Negative for heartburn, nausea, vomiting, abdominal pain, diarrhea, constipation, blood in stool and melena.  Genitourinary: Negative for dysuria, urgency, frequency and hematuria.  Musculoskeletal: Negative for myalgias, back pain and joint pain.  Skin: Negative for itching and rash.  Neurological: Negative for dizziness, tingling, tremors, sensory change, speech change, focal weakness, seizures, weakness and headaches.  Psychiatric/Behavioral: Negative for hallucinations and substance abuse.    Blood pressure 98/57, pulse 56, temperature 97.7 F (36.5 C), temperature source Oral, resp. rate 20, height 5\' 9"  (1.753 m), weight 108.455 kg (239 lb 1.6 oz), SpO2 95.00%. Physical Exam  Constitutional: He is oriented to person, place, and time. He appears well-developed and well-nourished. No distress.  HENT:  Head: Normocephalic and atraumatic.  Eyes: Right eye exhibits no discharge. Left eye exhibits no discharge. No scleral icterus.  Neck: No JVD present. No thyromegaly present.  Cardiovascular: Normal rate, regular rhythm, normal heart sounds and intact distal pulses.  Exam reveals no  gallop and no friction rub.   No murmur heard. Respiratory: Effort normal and breath sounds normal. No stridor. No respiratory distress. He has no wheezes. He has no rales. He exhibits no tenderness.  GI: Soft. Bowel sounds are normal. He exhibits no distension. There is no tenderness. There is no rebound and no guarding.  Musculoskeletal: He exhibits no edema and no tenderness.  Neurological: He is alert and oriented to person, place, and time.  Skin: No rash noted. He is not diaphoretic. No erythema.    EKG 07/14/2012 at 18:00 shows sinus rhythm with leftward axis. There is non-specific T-wave inversion in leads V2-V6 that looks less prominent when compared to his prior EKGs from last month.  Assessment/Plan   1.  Chest pain: Patient is s/p DES to LAD 07/07/2012, but he has residual disease in OM2 and PDA.  Given his development of recurrent chest pain, will admit him to cardiology service to be observed on telemetry. We will obtain serial cardiac markers to rule MI, and restart him on his home medication.  We will increase his Imdur from 30 mg to 60 mg q day.  If he has recurrent chest pain, we may start him on Nitro drip and Heparin.  He will likely need to go back to the cath lab for revascularization with or without nuclear  stress testing to determine which lesion to go after.  2.  ICM, LVEF 40% by ventriculogram from 07/07/2012, NYHA class III. Will restart his Metoprolol and Lisinopril  3.  DM: patient is on oral hypoglycemic agents which will be re-started. May hold his Metformin when he is going for cardiac cath  4.  Hyperlipidemia: He is on high intensity statin which will be restarted.  Dailee Manalang E 07/14/2012, 10:52 PM

## 2012-07-14 NOTE — ED Provider Notes (Signed)
History  This chart was scribed for Russell Jakes, MD by Ardelia Mems, ED Scribe. This patient was seen in room APA05/APA05 and the patient's care was started at 6:08 PM.  CSN: [id number]  Arrival date & time 07/14/12  1754    Chief Complaint  Patient presents with  . Chest Pain    The history is provided by the patient. No language interpreter was used.   HPI Comments: [name] is a 57 y.o. male who presents to the Emergency Department complaining of constant, gradually improving substernal chest pain that radiates to his left shoulder, left arm and jaw onset 2 hours PTA. Pt describes his chest pain as aching and relates it to indigestion. He describes the pain in his left arm and shoulder as "throbbing". Pt reports that his chest pain has mostly subsided but states that but his left arm continues to hurt. Pt reports associated SOB and headache that has been present for 2 hours, nausea and 1 episode of emesis that occurred at the onset of pain. Pt was seen in the ED for chest pain on 07/07/12, and he states that his current chest pain is very similar. Pt states that he had a cardiac stent placed 07/08/12. Pt states that he has not had chest pain since 07/08/12 until about 2 hours ago. Pt states that he take 81 mg ASA daily, and he stats that he took this today. Pt states that he has been prescribed Nitro for chest pain, but states that he has not taken it today. Pt denies fever, cough, leg swelling, dysuria or any other symptoms. Pt is a current everyday smoker but denies alcohol use. Pt admitted to occasional cocaine use on 07/07/12.  [facility] [place] Cardiologist- Dr. Carolanne Grumbling   Past Medical History  Diagnosis Date  . Hypertension   . Diabetes mellitus without complication   . Hyperlipidemia    Past Surgical History  Procedure Laterality Date  . Coronary angioplasty with stent placement     No family history on file. History  Substance Use Topics  . Smoking  status: Current Every Day Smoker    Types: Cigarettes  . Smokeless tobacco: Not on file  . Alcohol Use: No    Review of Systems  Constitutional: Negative for fever and chills.  HENT: Negative for congestion, sore throat and rhinorrhea.   Eyes: Negative for visual disturbance.  Respiratory: Positive for shortness of breath. Negative for cough.   Cardiovascular: Positive for chest pain. Negative for leg swelling.  Gastrointestinal: Positive for nausea, vomiting and abdominal pain. Negative for diarrhea.  Genitourinary: Negative for dysuria.  Musculoskeletal: Positive for back pain.  Skin: Positive for rash.  Neurological: Positive for headaches.  Hematological: Does not bruise/bleed easily.  Psychiatric/Behavioral: Negative for confusion.  A complete 10 system review of systems was obtained and all systems are negative except as noted in the HPI and PMH.   Allergies  Review of patient's allergies indicates no known allergies.  Home Medications   Current Outpatient Rx  Name  Route  Sig  Dispense  Refill  . aspirin 81 MG chewable tablet   Oral   Chew 1 tablet (81 mg total) by mouth daily.         Marland Kitchen atorvastatin (LIPITOR) 80 MG tablet   Oral   Take 1 tablet (80 mg total) by mouth daily.   30 tablet   11   . buPROPion (WELLBUTRIN SR) 150 MG 12 hr tablet   Oral  Take 1 tablet (150 mg total) by mouth 2 (two) times daily.   60 tablet   11   . gabapentin (NEURONTIN) 600 MG tablet   Oral   Take 300 mg by mouth 3 (three) times daily.         Marland Kitchen glipiZIDE (GLUCOTROL) 5 MG tablet   Oral   Take 10 mg by mouth 2 (two) times daily before a meal.         . HYDROcodone-acetaminophen (NORCO/VICODIN) 5-325 MG per tablet   Oral   Take 1 tablet by mouth 3 (three) times daily as needed for pain.          . isosorbide mononitrate (IMDUR) 30 MG 24 hr tablet   Oral   Take 1 tablet (30 mg total) by mouth daily.   30 tablet   11   . lisinopril (PRINIVIL,ZESTRIL) 5 MG tablet    Oral   Take 1 tablet (5 mg total) by mouth daily.   30 tablet   11   . metFORMIN (GLUCOPHAGE) 1000 MG tablet   Oral   Take 1 tablet (1,000 mg total) by mouth 2 (two) times daily with a meal. Do not restart Metformin until Wednesday am 7/2         . metoprolol tartrate (LOPRESSOR) 25 MG tablet   Oral   Take 12.5 mg by mouth 2 (two) times daily.         . Ticagrelor (BRILINTA) 90 MG TABS tablet   Oral   Take 1 tablet (90 mg total) by mouth 2 (two) times daily.   60 tablet   11   . nitroGLYCERIN (NITROSTAT) 0.4 MG SL tablet   Sublingual   Place 1 tablet (0.4 mg total) under the tongue every 5 (five) minutes x 3 doses as needed for chest pain.   25 tablet   12   . Vitamin D, Ergocalciferol, (DRISDOL) 50000 UNITS CAPS   Oral   Take 50,000 Units by mouth 2 (two) times a week. Takes on Wednesday and Saturday.          Triage Vitals: BP 105/80  Pulse 64  Temp(Src) 98.3 F (36.8 C) (Oral)  Resp 16  Ht 5\' 9"  (1.753 m)  Wt 243 lb (110.224 kg)  BMI 35.87 kg/m2  SpO2 93%  Physical Exam  Nursing note and vitals reviewed. Constitutional: He is oriented to person, place, and time. He appears well-developed and well-nourished.  HENT:  Head: Normocephalic and atraumatic.  Eyes: EOM are normal. Pupils are equal, round, and reactive to light.  Neck: Normal range of motion. Neck supple.  Cardiovascular: Normal rate, regular rhythm, normal heart sounds and intact distal pulses.   No murmur heard. No swelling in ankles.  Pulmonary/Chest: Effort normal and breath sounds normal. No respiratory distress. He has no wheezes.  Abdominal: Soft. Bowel sounds are normal. There is no tenderness.  Musculoskeletal: Normal range of motion. He exhibits no tenderness.  Neurological: He is alert and oriented to person, place, and time.  Skin: Skin is warm and dry.  Slight erythema, a little bit papular, on the back of the fingers of both wrists.  Psychiatric: He has a normal mood and affect.     ED Course  Procedures (including critical care time)  DIAGNOSTIC STUDIES: Oxygen Saturation is 93% on RA, normal by my interpretation.    COORDINATION OF CARE: 6:27 PM- Pt advised of plan for treatment and pt agrees.  Medications  nitroGLYCERIN (NITROSTAT) SL tablet 0.4 mg (0.4  mg Sublingual Given 07/14/12 1849)  0.9 %  sodium chloride infusion ( Intravenous New Bag/Given 07/14/12 1846)  aspirin chewable tablet 324 mg (324 mg Oral Given 07/14/12 1812)  aspirin 81 MG chewable tablet (  Given 07/14/12 1815)  sodium chloride 0.9 % bolus 250 mL (0 mLs Intravenous Stopped 07/14/12 1941)  ondansetron (ZOFRAN) injection 4 mg (4 mg Intravenous Given 07/14/12 1845)  morphine 2 MG/ML injection 2 mg (2 mg Intravenous Given 07/14/12 1845)    Labs Reviewed  CBC WITH DIFFERENTIAL - Abnormal; Notable for the following:    WBC 13.3 (*)    RBC 5.95 (*)    Neutro Abs 9.3 (*)    Eosinophils Relative 6 (*)    Eosinophils Absolute 0.8 (*)    All other components within normal limits  BASIC METABOLIC PANEL - Abnormal; Notable for the following:    Sodium 134 (*)    Glucose, Bld 111 (*)    BUN 27 (*)    Creatinine, Ser 1.50 (*)    GFR calc non Af Amer 50 (*)    GFR calc Af Amer 58 (*)    All other components within normal limits  TROPONIN I   Results for orders placed during the hospital encounter of 07/14/12  TROPONIN I      Result Value Range   Troponin I <0.30  <0.30 ng/mL  CBC WITH DIFFERENTIAL      Result Value Range   WBC 13.3 (*) 4.0 - 10.5 K/uL   RBC 5.95 (*) 4.22 - 5.81 MIL/uL   Hemoglobin 16.1  13.0 - 17.0 g/dL   HCT 16.1  09.6 - 04.5 %   MCV 80.2  78.0 - 100.0 fL   MCH 27.1  26.0 - 34.0 pg   MCHC 33.8  30.0 - 36.0 g/dL   RDW 40.9  81.1 - 91.4 %   Platelets 300  150 - 400 K/uL   Neutrophils Relative % 70  43 - 77 %   Neutro Abs 9.3 (*) 1.7 - 7.7 K/uL   Lymphocytes Relative 19  12 - 46 %   Lymphs Abs 2.5  0.7 - 4.0 K/uL   Monocytes Relative 5  3 - 12 %   Monocytes Absolute 0.7  0.1  - 1.0 K/uL   Eosinophils Relative 6 (*) 0 - 5 %   Eosinophils Absolute 0.8 (*) 0.0 - 0.7 K/uL   Basophils Relative 1  0 - 1 %   Basophils Absolute 0.1  0.0 - 0.1 K/uL  BASIC METABOLIC PANEL      Result Value Range   Sodium 134 (*) 135 - 145 mEq/L   Potassium 4.5  3.5 - 5.1 mEq/L   Chloride 97  96 - 112 mEq/L   CO2 28  19 - 32 mEq/L   Glucose, Bld 111 (*) 70 - 99 mg/dL   BUN 27 (*) 6 - 23 mg/dL   Creatinine, Ser 7.82 (*) 0.50 - 1.35 mg/dL   Calcium 95.6  8.4 - 21.3 mg/dL   GFR calc non Af Amer 50 (*) >90 mL/min   GFR calc Af Amer 58 (*) >90 mL/min    Date: 07/14/2012  Rate: 68  Rhythm: normal sinus rhythm  QRS Axis: left  Intervals: normal  ST/T Wave abnormalities: nonspecific T wave changes  Conduction Disutrbances:none  Narrative Interpretation:   Old EKG Reviewed: changes noted Compared to June 30. T wave abnormalities in anterior lateral leads are less prominent but still present.    Dg Chest  Portable 1 View  07/14/2012   *RADIOLOGY REPORT*  Clinical Data: Hypertension.  Diabetes.  Chest pain.  PORTABLE CHEST - 1 VIEW  Comparison: 07/07/2012  Findings: Low lung volumes are present, causing crowding of the pulmonary vasculature.  Mild cardiomegaly appears stable.  Mildly prominent upper zone vasculatures suggest mild pulmonary venous hypertension.  No overt edema.  IMPRESSION:  1.  Stable mild cardiomegaly with suspected pulmonary venous hypertension.  No overt edema.   Original Report Authenticated By: Gaylyn Rong, M.D.    1. Chest pain     MDM  Patient status post cardiac cath one week ago. Admitted by Dr. Mayford Knife cardiology for non-STEMI. Following the cath they did a stent. Patient chest pain free until today one to 2 hours prior to arrival at onset of left substernal chest pain radiating to left shoulder up into the jaw and down the left arm with vomiting times one nausea persists. Pain completely relieved in the emergency department with aspirin nitroglycerin  sublingual and 2 mg of morphine. First troponin negative chest x-ray negative for pneumonia pulmonary edema or pneumothorax.  Discussed with on-call cardiology at [facility] they will admit for rule out. EKG with normalization of the some of the marked T-wave abnormalities that were present on June 30 but still has some T wave abnormality in the anterior lateral leads.        I personally performed the services described in this documentation, which was scribed in my presence. The recorded information has been reviewed and is accurate.     Russell Jakes, MD 07/14/12 [phone]

## 2012-07-15 ENCOUNTER — Encounter (HOSPITAL_COMMUNITY): Payer: Self-pay | Admitting: *Deleted

## 2012-07-15 LAB — LIPID PANEL
Cholesterol: 122 mg/dL (ref 0–200)
Triglycerides: 136 mg/dL (ref <150)
VLDL: 27 mg/dL (ref 0–40)

## 2012-07-15 LAB — GLUCOSE, CAPILLARY
Glucose-Capillary: 137 mg/dL — ABNORMAL HIGH (ref 70–99)
Glucose-Capillary: 193 mg/dL — ABNORMAL HIGH (ref 70–99)

## 2012-07-15 LAB — CBC
Hemoglobin: 14.7 g/dL (ref 13.0–17.0)
MCH: 26.1 pg (ref 26.0–34.0)
Platelets: 254 10*3/uL (ref 150–400)
RBC: 5.64 MIL/uL (ref 4.22–5.81)
WBC: 10.4 10*3/uL (ref 4.0–10.5)

## 2012-07-15 LAB — TSH: TSH: 4.88 u[IU]/mL — ABNORMAL HIGH (ref 0.350–4.500)

## 2012-07-15 LAB — TROPONIN I
Troponin I: <0.3 ng/mL (ref <0.30)
Troponin I: <0.3 ng/mL (ref <0.30)

## 2012-07-15 LAB — CREATININE, SERUM
GFR calc Af Amer: 58 mL/min — ABNORMAL LOW (ref >90)
GFR calc non Af Amer: 50 mL/min — ABNORMAL LOW (ref >90)

## 2012-07-15 LAB — T4, FREE: Free T4: 0.95 ng/dL (ref 0.80–1.80)

## 2012-07-15 MED ORDER — OXYCODONE-ACETAMINOPHEN 5-325 MG PO TABS
1.0000 | ORAL_TABLET | Freq: Four times a day (QID) | ORAL | Status: DC | PRN
  Administered 2012-07-16 (×2): 1 via ORAL
  Filled 2012-07-15: qty 1

## 2012-07-15 MED ORDER — DIPHENHYDRAMINE HCL 25 MG PO CAPS
25.0000 mg | ORAL_CAPSULE | Freq: Four times a day (QID) | ORAL | Status: DC | PRN
  Administered 2012-07-15: 25 mg via ORAL
  Filled 2012-07-15: qty 1

## 2012-07-15 NOTE — Progress Notes (Signed)
Subjective:  Readmitted with chest pain that sounded ischemic. Initial enzymes are negative. EKG shows anterolateral ischemia. He is currently complaining of a throbbing headache Objective:  Vital Signs in the last 24 hours: BP 110/63  Pulse 61  Temp(Src) 97.9 F (36.6 C) (Oral)  Resp 18  Ht 5\' 9"  (1.753 m)  Wt 108.455 kg (239 lb 1.6 oz)  BMI 35.29 kg/m2  SpO2 95%  Physical Exam: Obese black male in no acute distress Lungs:  Clear  Cardiac:  Regular rhythm, normal S1 and S2, no S3 Abdomen:  Soft, nontender, no masses Extremities:  No edema present  Weight Filed Weights   07/14/12 1803 07/14/12 2216 07/15/12 0350  Weight: 110.224 kg (243 lb) 108.455 kg (239 lb 1.6 oz) 108.455 kg (239 lb 1.6 oz)    Lab Results: Basic Metabolic Panel:  Recent Labs  16/10/96 1809 07/14/12 2315  NA 134*  --   K 4.5  --   CL 97  --   CO2 28  --   GLUCOSE 111*  --   BUN 27*  --   CREATININE 1.50* 1.50*   CBC:  Recent Labs  07/14/12 1809 07/14/12 2315 07/15/12 0408  WBC 13.3* 11.7* 10.4  NEUTROABS 9.3*  --   --   HGB 16.1 15.2 14.7  HCT 47.7 45.4 44.8  MCV 80.2 78.7 79.4  PLT 300 271 254   Cardiac Enzymes:  Recent Labs  07/14/12 2315 07/15/12 0408 07/15/12 1045  TROPONINI <0.30 <0.30 <0.30    Telemetry: Sinus rhythm  Assessment/Plan:  1. Unstable angina pectoris 2. Coronary artery disease with recent drug-eluting stent  Recommendations:  N.p.o. past midnight for possible repeat cath tomorrow since it residual disease in the other vessels.   Darden Palmer  MD [facility]  07/15/2012, 11:58 AM

## 2012-07-16 ENCOUNTER — Inpatient Hospital Stay (HOSPITAL_COMMUNITY): Payer: Medicare Other

## 2012-07-16 LAB — GLUCOSE, CAPILLARY: Glucose-Capillary: 191 mg/dL — ABNORMAL HIGH (ref 70–99)

## 2012-07-16 MED ORDER — TECHNETIUM TC 99M SESTAMIBI GENERIC - CARDIOLITE
10.0000 | Freq: Once | INTRAVENOUS | Status: AC | PRN
  Administered 2012-07-16: 10 via INTRAVENOUS

## 2012-07-16 MED ORDER — AMLODIPINE BESYLATE 5 MG PO TABS: 5.0000 mg | ORAL_TABLET | Freq: Every day | ORAL | Status: DC

## 2012-07-16 MED ORDER — REGADENOSON 0.4 MG/5ML IV SOLN
0.4000 mg | Freq: Once | INTRAVENOUS | Status: AC
  Administered 2012-07-16: 0.4 mg via INTRAVENOUS

## 2012-07-16 MED ORDER — TECHNETIUM TC 99M SESTAMIBI GENERIC - CARDIOLITE
30.0000 | Freq: Once | INTRAVENOUS | Status: AC | PRN
  Administered 2012-07-16: 30 via INTRAVENOUS

## 2012-07-16 NOTE — Progress Notes (Signed)
UR Completed Isabella Roemmich Graves-Bigelow, RN,BSN [phone]  

## 2012-07-16 NOTE — Progress Notes (Signed)
Pt discharge teaching done. Pt informed on when to drive again & to remain on a low sodium diet.Pt taught to increase activity slowly. Pt's Iv was removed, VS taken, & night meds given. Pt informed on my chart & how to sign up. Pt also informed on when to follow up with dr turner. Sanda Linger, RN

## 2012-07-16 NOTE — Discharge Summary (Signed)
Patient ID: [name] MRN: [id number] DOB/AGE: Dec 18, 1955 57 y.o.  Admit date: 07/14/2012 Discharge date: 07/16/2012  Primary Discharge Diagnosis  Unstable angina Secondary Discharge Diagnosis  S/P recent NSTEMI  ASCAD s/p PCIwith DES to LAD with residugal 80-90% OM2, 50-70% PDA  HTN  Dyslipidemia  DM  Significant Diagnostic Studies: nuclear medicine: Clinical Data: Chest pain. The coronary stent. Tobacco use.  Diabetes. Hypertension. Coronary artery disease. Prior infarct.  Technique: Standard myocardial SPECT imaging performed after  resting intravenous injection of Tc-93m tetrofosmin. Subsequently,  stress in the form of Lexiscan was administered under the  supervision of the Cardiology staff. At peak stress, Tc-84m  tetrofosmin was injected intravenously and standard myocardial  SPECT imaging performed. Quantitative gated imaging also performed  to evaluate left ventricular wall motion and estimate left  ventricular ejection fraction.  Radiopharmaceutical: Tc-44m tetrofosmin, 10 mCi at rest and 30 mCi  at stress.  Comparison: Chest radiograph dated 07/14/2012  MYOCARDIAL IMAGING WITH SPECT (REST AND STRESS)  Findings: Mild apical thinning and equivocally reduced  anteroseptal activity, potentially from scar. No inducible  ischemia.  LEFT VENTRICULAR EJECTION FRACTION  Findings: Left ventricular end-diastolic volume is 117 cc. End-  systolic volume is 56 cc. Derived LV ejection fraction is 52%.  GATED LEFT VENTRICULAR WALL MOTION STUDY  Findings: Mild lateral hypokinesis.  IMPRESSION:  1. Mild apical thinning and equivocally reduced anteroseptal  activity, conceivably from scar. No inducible ischemia observed.  Ejection fraction calculated 52%.  2. Mild lateral hypokinesis.  Original Report Authenticated By: Gaylyn Rong, M.D.   Consults: None  Hospital Course: 57 y/o male with a PMH of CAD presenting as a transfer from [facility] where he originally presented  for chest pain evaluation. He was recently admitted to [facility] for NSTEMI. He had a cardiac cath 07/07/2012 and was found to have 99% thrombotic occlusion of mLAD with sequential 80% stenosis and is s/p DES to LAD. He also had 80-90% OM2 stenosis as well as moderate diffuse disease in RCA with 50-70% stenosis of PDA that are currently been treated medically; LVEF was 40% by ventriculogram. Since discharge from hospital, he has been doing well until the day of presentation when he started to complain of chest that he describes as aching pain, 3/10 in severity, radiating to his jaw and arm and associated with nausea. He states that this pain is similar to the pain he had prior to his stent placement. His pain was relieved by SL NTG prior to transfer to [facility]. He ruled out for MI with serial troponins.  Also he complained of a headache since discharge at the same time each day about 2-3 hours after taking his Imdur.  His Imdur was stopped and he was started on Amlodipine 5mg  daily for BP control and as an antianginal med.  He was continued on beta blockers but his ACE I was stopped due to renal insufficiency.  His headache resolved after stopping Imdur.  He has no further chest pain.  On the day of discharge he underwent Lexiscan Myoview which showed no inducible ischemia, there was evidence of apical thinning and reduced anteroseptal activity c/w scar, no inducible ischemia and EF 52% with lateral HK.  He was subsequently discharged to home in stable condition.   Discharge Exam: Blood pressure 141/81, pulse 58, temperature 98.2 F (36.8 C), temperature source Oral, resp. rate 18, height 5\' 9"  (1.753 m), weight 107.7 kg (237 lb 7 oz), SpO2 100.00%.   General appearance: alert  Resp: clear to auscultation bilaterally Cardio: regular rate and rhythm, S1, S2 normal, no murmur, click, rub or gallop GI: soft, non-tender; bowel sounds normal; no masses,  no organomegaly Extremities:  extremities normal, atraumatic, no cyanosis or edema Labs:   Lab Results  Component Value Date   WBC 10.4 07/15/2012   HGB 14.7 07/15/2012   HCT 44.8 07/15/2012   MCV 79.4 07/15/2012   PLT 254 07/15/2012    Recent Labs Lab 07/14/12 1809 07/14/12 2315  NA 134*  --   K 4.5  --   CL 97  --   CO2 28  --   BUN 27*  --   CREATININE 1.50* 1.50*  CALCIUM 10.1  --   GLUCOSE 111*  --    Lab Results  Component Value Date   CKTOTAL 74 11/21/2009   CKMB 0.8 11/21/2009   TROPONINI <0.30 07/15/2012    Lab Results  Component Value Date   CHOL 122 07/15/2012   CHOL 155 07/08/2012   Lab Results  Component Value Date   HDL 19* 07/15/2012   HDL 25* 07/08/2012   Lab Results  Component Value Date   LDLCALC 76 07/15/2012   LDLCALC 101* 07/08/2012   Lab Results  Component Value Date   TRIG 136 07/15/2012   TRIG 146 07/08/2012   Lab Results  Component Value Date   CHOLHDL 6.4 07/15/2012   CHOLHDL 6.2 07/08/2012   No results found for this basename: LDLDIRECT      Radiology:*RADIOLOGY REPORT*  Clinical Data: Hypertension. Diabetes. Chest pain.  PORTABLE CHEST - 1 VIEW  Comparison: 07/07/2012  Findings: Low lung volumes are present, causing crowding of the  pulmonary vasculature. Mild cardiomegaly appears stable. Mildly  prominent upper zone vasculatures suggest mild pulmonary venous  hypertension. No overt edema.  IMPRESSION:  1. Stable mild cardiomegaly with suspected pulmonary venous  hypertension. No overt edema.  Original Report Authenticated By: Gaylyn Rong, M.D.  EKG:  NSR with anterior T wave abnormality improved from EKG on last hospitalization  FOLLOW UP PLANS AND APPOINTMENTS Discharge Orders   Future Orders Complete By Expires     Diet - low sodium heart healthy  As directed     Increase activity slowly  As directed         Medication List    STOP taking these medications       isosorbide mononitrate 30 MG 24 hr tablet  Commonly known as:  IMDUR     lisinopril 5 MG  tablet  Commonly known as:  PRINIVIL,ZESTRIL      TAKE these medications       amLODipine 5 MG tablet  Commonly known as:  NORVASC  Take 1 tablet (5 mg total) by mouth daily.  Start taking on:  07/17/2012     aspirin 81 MG chewable tablet  Chew 1 tablet (81 mg total) by mouth daily.     atorvastatin 80 MG tablet  Commonly known as:  LIPITOR  Take 1 tablet (80 mg total) by mouth daily.     buPROPion 150 MG 12 hr tablet  Commonly known as:  WELLBUTRIN SR  Take 1 tablet (150 mg total) by mouth 2 (two) times daily.     gabapentin 600 MG tablet  Commonly known as:  NEURONTIN  Take 300 mg by mouth 3 (three) times daily.     glipiZIDE 5 MG tablet  Commonly known as:  GLUCOTROL  Take 10 mg by mouth 2 (two) times daily before a meal.  HYDROcodone-acetaminophen 5-325 MG per tablet  Commonly known as:  NORCO/VICODIN  Take 1 tablet by mouth 3 (three) times daily as needed for pain.     metFORMIN 1000 MG tablet  Commonly known as:  GLUCOPHAGE  Take 1 tablet (1,000 mg total) by mouth 2 (two) times daily with a meal. Do not restart Metformin until Wednesday am 7/2     metoprolol tartrate 25 MG tablet  Commonly known as:  LOPRESSOR  Take 12.5 mg by mouth 2 (two) times daily.     nitroGLYCERIN 0.4 MG SL tablet  Commonly known as:  NITROSTAT  Place 1 tablet (0.4 mg total) under the tongue every 5 (five) minutes x 3 doses as needed for chest pain.     Ticagrelor 90 MG Tabs tablet  Commonly known as:  BRILINTA  Take 1 tablet (90 mg total) by mouth 2 (two) times daily.     Vitamin D (Ergocalciferol) 50000 UNITS Caps  Commonly known as:  DRISDOL  Take 50,000 Units by mouth 2 (two) times a week. Takes on Wednesday and Saturday.           Follow-up Information   Follow up with Quintella Reichert, MD On 07/24/2012. (call for an appointment with Dr. Mayford Knife in 1 week)    Contact information:   [address] [place] [address] [phone]       BRING ALL MEDICATIONS  WITH YOU TO FOLLOW UP APPOINTMENTS  Time spent with patient to include physician time35 minutes Signed: Quintella Reichert 07/16/2012, 9:14 PM

## 2012-07-16 NOTE — Plan of Care (Signed)
Problem: Discharge Progression Outcomes Goal: No anginal pain Outcome: Completed/Met Date Met:  07/16/12 Pt CP free

## 2012-07-16 NOTE — Progress Notes (Addendum)
SUBJECTIVE:  No further CP but complains of headache  OBJECTIVE:   Vitals:   Filed Vitals:   07/15/12 0350 07/15/12 1400 07/15/12 2030 07/16/12 0509  BP: 110/63 114/65 122/72 120/71  Pulse: 61 60 60 59  Temp: 97.9 F (36.6 C) 97.8 F (36.6 C) 98 F (36.7 C) 98.2 F (36.8 C)  TempSrc: Oral  Oral Oral  Resp: 18 16 18 18   Height:      Weight: 108.455 kg (239 lb 1.6 oz)   107.7 kg (237 lb 7 oz)  SpO2: 95% 95% 97% 94%   I&O's:   Intake/Output Summary (Last 24 hours) at 07/16/12 1124 Last data filed at 07/15/12 1200  Gross per 24 hour  Intake    360 ml  Output      0 ml  Net    360 ml   TELEMETRY: Reviewed telemetry pt in NSR:     PHYSICAL EXAM General: Well developed, well nourished, in no acute distress Head: Eyes PERRLA, No xanthomas.   Normal cephalic and atramatic  Lungs:   Clear bilaterally to auscultation and percussion. Heart:   HRRR S1 S2 Pulses are 2+ & equal. Abdomen: Bowel sounds are positive, abdomen soft and non-tender without masses Extremities:   No clubbing, cyanosis or edema.  DP +1 Neuro: Alert and oriented X 3. Psych:  Good affect, responds appropriately   LABS: Basic Metabolic Panel:  Recent Labs  74/25/95 1809 07/14/12 2315  NA 134*  --   K 4.5  --   CL 97  --   CO2 28  --   GLUCOSE 111*  --   BUN 27*  --   CREATININE 1.50* 1.50*  CALCIUM 10.1  --    Liver Function Tests: No results found for this basename: AST, ALT, ALKPHOS, BILITOT, PROT, ALBUMIN,  in the last 72 hours No results found for this basename: LIPASE, AMYLASE,  in the last 72 hours CBC:  Recent Labs  07/14/12 1809 07/14/12 2315 07/15/12 0408  WBC 13.3* 11.7* 10.4  NEUTROABS 9.3*  --   --   HGB 16.1 15.2 14.7  HCT 47.7 45.4 44.8  MCV 80.2 78.7 79.4  PLT 300 271 254   Cardiac Enzymes:  Recent Labs  07/14/12 2315 07/15/12 0408 07/15/12 1045  TROPONINI <0.30 <0.30 <0.30   Fasting Lipid Panel:  Recent Labs  07/15/12 0408  CHOL 122  HDL 19*  LDLCALC 76   TRIG 136  CHOLHDL 6.4   Thyroid Function Tests:  Recent Labs  07/14/12 2315  TSH 4.880*   Anemia Panel: No results found for this basename: VITAMINB12, FOLATE, FERRITIN, TIBC, IRON, RETICCTPCT,  in the last 72 hours Coag Panel:   Lab Results  Component Value Date   INR 1.04 07/07/2012   INR 0.98 07/07/2012    RADIOLOGY: Dg Chest Portable 1 View  07/14/2012   *RADIOLOGY REPORT*  Clinical Data: Hypertension.  Diabetes.  Chest pain.  PORTABLE CHEST - 1 VIEW  Comparison: 07/07/2012  Findings: Low lung volumes are present, causing crowding of the pulmonary vasculature.  Mild cardiomegaly appears stable.  Mildly prominent upper zone vasculatures suggest mild pulmonary venous hypertension.  No overt edema.  IMPRESSION:  1.  Stable mild cardiomegaly with suspected pulmonary venous hypertension.  No overt edema.   Original Report Authenticated By: Gaylyn Rong, M.D.   Dg Chest Port 1 View  07/07/2012   *RADIOLOGY REPORT*  Clinical Data: Chest pain  PORTABLE CHEST - 1 VIEW  Comparison: 11/21/2009  Findings: Low  lung volumes.  Lungs are grossly clear.  Mild cardiomegaly.  Normal vascularity.  No pneumothorax.  IMPRESSION: Cardiomegaly without decompensation.   Original Report Authenticated By: Jolaine Click, M.D.    Assessment:  1. Unstable angina pectoris with negative cardiac enzymes 2. Coronary artery disease with recent NSTEMI and drug-eluting stent to LAD and residual 80-90% stenosis of OM2 and moderate diffuse disease of RCA 50-70% of PDA.   3.  Mild LV dysfunction EF 40% by cath 4.  HTN 5.  Dyslipidemia 6.  DM 7.  Headache - occurs now daily at the same time of day around 11am ( 3 hours after taking Imdur)  PLAN:  Case reviewed with Dr. Eldridge Dace who placed original LAD stent.  He recommends nuclear stress test to evaluate for area of ischemia.  If no ischemia then will continue medical management and titrate antianginal meds.  Will get Lexiscan CL today.  Will stop Imdur due to  headache and start amlodipine 5mg  daily for antianginal therapy  Quintella Reichert, MD  07/16/2012  11:24 AM

## 2012-08-22 ENCOUNTER — Observation Stay (HOSPITAL_COMMUNITY)
Admission: EM | Admit: 2012-08-22 | Discharge: 2012-08-22 | Disposition: A | Discharge status: Home / Self Care | Payer: Non-veteran care | Attending: Internal Medicine | Admitting: Internal Medicine

## 2012-08-22 ENCOUNTER — Encounter (HOSPITAL_COMMUNITY): Payer: Self-pay | Admitting: *Deleted

## 2012-08-22 ENCOUNTER — Emergency Department (HOSPITAL_COMMUNITY): Payer: Non-veteran care

## 2012-08-22 ENCOUNTER — Observation Stay (HOSPITAL_COMMUNITY): Payer: Non-veteran care

## 2012-08-22 DIAGNOSIS — I10 Essential (primary) hypertension: Secondary | ICD-10-CM | POA: Insufficient documentation

## 2012-08-22 DIAGNOSIS — E119 Type 2 diabetes mellitus without complications: Principal | ICD-10-CM

## 2012-08-22 DIAGNOSIS — I119 Hypertensive heart disease without heart failure: Secondary | ICD-10-CM | POA: Diagnosis present

## 2012-08-22 DIAGNOSIS — R7309 Other abnormal glucose: Secondary | ICD-10-CM | POA: Diagnosis not present

## 2012-08-22 DIAGNOSIS — I252 Old myocardial infarction: Secondary | ICD-10-CM | POA: Insufficient documentation

## 2012-08-22 DIAGNOSIS — R519 Headache, unspecified: Secondary | ICD-10-CM

## 2012-08-22 DIAGNOSIS — I2510 Atherosclerotic heart disease of native coronary artery without angina pectoris: Secondary | ICD-10-CM | POA: Diagnosis not present

## 2012-08-22 DIAGNOSIS — R51 Headache: Secondary | ICD-10-CM

## 2012-08-22 DIAGNOSIS — E785 Hyperlipidemia, unspecified: Secondary | ICD-10-CM | POA: Diagnosis present

## 2012-08-22 DIAGNOSIS — R079 Chest pain, unspecified: Secondary | ICD-10-CM

## 2012-08-22 DIAGNOSIS — R739 Hyperglycemia, unspecified: Secondary | ICD-10-CM

## 2012-08-22 DIAGNOSIS — R0602 Shortness of breath: Secondary | ICD-10-CM | POA: Insufficient documentation

## 2012-08-22 HISTORY — DX: Old myocardial infarction: I25.2

## 2012-08-22 LAB — POCT I-STAT TROPONIN I: Troponin i, poc: 0.02 ng/mL (ref 0.00–0.08)

## 2012-08-22 LAB — POCT I-STAT, CHEM 8
Calcium, Ion: 1.21 mmol/L (ref 1.12–1.23)
HCT: 44 % (ref 39.0–52.0)
Hemoglobin: 15 g/dL (ref 13.0–17.0)
Sodium: 138 mEq/L (ref 135–145)
TCO2: 24 mmol/L (ref 0–100)

## 2012-08-22 LAB — D-DIMER, QUANTITATIVE: D-Dimer, Quant: <0.27 ug/mL-FEU (ref 0.00–0.48)

## 2012-08-22 LAB — CBC
MCV: 79 fL (ref 78.0–100.0)
Platelets: 255 10*3/uL (ref 150–400)
RBC: 5.24 MIL/uL (ref 4.22–5.81)
RDW: 15.2 % (ref 11.5–15.5)
WBC: 10 10*3/uL (ref 4.0–10.5)

## 2012-08-22 LAB — GLUCOSE, CAPILLARY: Glucose-Capillary: 248 mg/dL — ABNORMAL HIGH (ref 70–99)

## 2012-08-22 LAB — TROPONIN I: Troponin I: <0.3 ng/mL (ref <0.30)

## 2012-08-22 MED ORDER — OXYCODONE-ACETAMINOPHEN 5-325 MG PO TABS
1.0000 | ORAL_TABLET | ORAL | Status: AC
  Administered 2012-08-22: 1 via ORAL

## 2012-08-22 MED ORDER — OXYCODONE-ACETAMINOPHEN 5-325 MG PO TABS
ORAL_TABLET | ORAL | Status: AC
  Filled 2012-08-22: qty 1

## 2012-08-22 MED ORDER — MORPHINE SULFATE 4 MG/ML IJ SOLN
4.0000 mg | Freq: Once | INTRAMUSCULAR | Status: AC
  Administered 2012-08-22: 4 mg via INTRAVENOUS
  Filled 2012-08-22: qty 1

## 2012-08-22 MED ORDER — ASPIRIN 81 MG PO CHEW
324.0000 mg | CHEWABLE_TABLET | Freq: Once | ORAL | Status: AC
  Administered 2012-08-22: 324 mg via ORAL
  Filled 2012-08-22: qty 4

## 2012-08-22 MED ORDER — ONDANSETRON HCL 4 MG/2ML IJ SOLN
4.0000 mg | Freq: Once | INTRAMUSCULAR | Status: AC
  Administered 2012-08-22: 4 mg via INTRAVENOUS
  Filled 2012-08-22: qty 2

## 2012-08-22 MED ORDER — SODIUM CHLORIDE 0.9 % IV SOLN
Freq: Once | INTRAVENOUS | Status: AC
  Administered 2012-08-22: 20 mL/h via INTRAVENOUS

## 2012-08-22 NOTE — ED Notes (Signed)
Dr,memon to see pt.

## 2012-08-22 NOTE — ED Notes (Signed)
Pt requested meds for headache, will notify dr.memon

## 2012-08-22 NOTE — Consult Note (Signed)
[facility] Medical Consultation  [name] YNW:[id number] DOB: [birth year] DOA: 08/22/2012 PCP: No PCP Per Patient   Requesting physician: Dr. Dierdre Highman, ER physician Date of consultation: 08/22/12 Reason for consultation: headache, hyperglycemia, possible admission  Impression/Recommendations Active Problems:   DM (diabetes mellitus)   HTN (hypertension)   Dyslipidemia   CAD (coronary artery disease)   Hyperglycemia   Headache   Tobacco abuse    1. Headache. Unclear etiology. Review records indicate that he did have this headache around the time that he was seen for his myocardial infarction. At that time it was felt that his headache may be related to Imdur and this was discontinued. Amlodipine was started in its place for blood pressure control as well as antianginal. He reports that he has had continued intermittent headaches. This could possibly related to sinuses. He does not have any focal deficits. Will check CT of the head to rule out any underlying pathology. 2. Coronary artery disease. Does not appear to be having any active chest pain at this time. EKG does not show any acute changes. Point-of-care troponin was found to be negative. We will check one more set of cardiac markers as well as d-dimer. 3. Diabetes with hyperglycemia. Blood sugars appear to be mildly elevated at 240. We will recheck another level at this time. He was instructed regarding the importance of compliance with diabetic diet. If blood sugars remain stable, he does not need any further acute intervention at this time. 4. Tobacco abuse. Patient was counseled on the importance of tobacco cessation.  Patient will be monitored in the emergency room while above workup is being completed. If workup is unremarkable, I think we may be able to discharge him home with close followup with his primary care doctor. Currently, he has been ambulating on the unit. His basic labs, chest x-ray, EKG have been  unremarkable and vital signs are otherwise normal.  Chief Complaint: headache  HPI:  This is a 57 year old gentleman with history of hypertension, diabetes, coronary artery disease with stent placed LAD in 06/2012. He had a followup Lexus scan Myoview in 07/2012 which did not show any inducible ischemia. The patient presents to the hospital today for various complaints. He reports that his sugar was greater than 400 when checked at home overnight. He reports that he had taken his medicines as prescribed. He had eaten a hamburger with french fries yesterday. He also describes intermittent headache in his temples bilaterally. This has been intermittent since his MI. He reports that it was worse last night. He denies any changes in vision, unilateral weakness or numbness. He does feel generally weak. He reports that occasionally clearing his nose may help with his headache. He also describes a mild sore throat. He reports a twinge of chest discomfort which has been occurring intermittently since his MI. This is been no different from baseline. He also describes some shortness of breath last night. He has had some nausea but no vomiting. He had diarrhea several days ago but this has since resolved. He was evaluated in the emergency room and blood sugar found to be in the range of 240. Vitals are otherwise stable. EKG did not show any acute changes from prior EKG. Initial point-of-care cardiac markers was found to be negative. The patient was referred for possible admission for observation.  Review of Systems:  Pertinent positives as per HPI, otherwise negative  Past Medical History  Diagnosis Date  . Hypertension   . Diabetes mellitus without complication   .  Hyperlipidemia   . Coronary artery disease   . MI, old    Past Surgical History  Procedure Laterality Date  . Coronary angioplasty with stent placement     Social History:  reports that he has been smoking Cigarettes.  He has been smoking about  0.00 packs per day. He does not have any smokeless tobacco history on file. He reports that  drinks alcohol. He reports that he uses illicit drugs (Cocaine).  No Known Allergies  Family history: Mother alive with ? Heart problem. Does not know his dad. Aunt with PPM   Prior to Admission medications   Medication Sig Start Date End Date Taking? Authorizing Provider  amLODipine (NORVASC) 5 MG tablet Take 1 tablet (5 mg total) by mouth daily. 07/17/12  Yes Quintella Reichert, MD  aspirin EC 81 MG tablet Take 81 mg by mouth 2 (two) times daily.   Yes Historical Provider, MD  atorvastatin (LIPITOR) 80 MG tablet Take 1 tablet (80 mg total) by mouth daily. 07/10/12  Yes Quintella Reichert, MD  buPROPion (WELLBUTRIN SR) 150 MG 12 hr tablet Take 1 tablet (150 mg total) by mouth 2 (two) times daily. 07/10/12  Yes Quintella Reichert, MD  gabapentin (NEURONTIN) 600 MG tablet Take 300 mg by mouth 3 (three) times daily.   Yes Historical Provider, MD  glipiZIDE (GLUCOTROL) 5 MG tablet Take 10 mg by mouth 2 (two) times daily before a meal.   Yes Historical Provider, MD  HYDROcodone-acetaminophen (NORCO/VICODIN) 5-325 MG per tablet Take 1 tablet by mouth 3 (three) times daily as needed for pain.    Yes Historical Provider, MD  isosorbide mononitrate (IMDUR) 30 MG 24 hr tablet Take 30 mg by mouth daily.   Yes Historical Provider, MD  lisinopril (PRINIVIL,ZESTRIL) 5 MG tablet Take 5 mg by mouth daily.   Yes Historical Provider, MD  metFORMIN (GLUCOPHAGE) 1000 MG tablet Take 1 tablet (1,000 mg total) by mouth 2 (two) times daily with a meal. Do not restart Metformin until Wednesday am 7/2 07/10/12  Yes Quintella Reichert, MD  metoprolol tartrate (LOPRESSOR) 25 MG tablet Take 25 mg by mouth 2 (two) times daily. **hold if heart rate less than 50**   Yes Historical Provider, MD  nitroGLYCERIN (NITROSTAT) 0.4 MG SL tablet Place 1 tablet (0.4 mg total) under the tongue every 5 (five) minutes x 3 doses as needed for chest pain. 07/10/12  Yes Quintella Reichert, MD  Ticagrelor (BRILINTA) 90 MG TABS tablet Take 1 tablet (90 mg total) by mouth 2 (two) times daily. 07/10/12  Yes Quintella Reichert, MD  Vitamin D, Ergocalciferol, (DRISDOL) 50000 UNITS CAPS Take 50,000 Units by mouth 2 (two) times a week. Takes on Wednesday and Saturday.   Yes Historical Provider, MD   Physical Exam: Blood pressure 114/67, pulse 65, temperature 97.6 F (36.4 C), temperature source Oral, resp. rate 22, height 5\' 9"  (1.753 m), weight 110.224 kg (243 lb), SpO2 99.00%. Filed Vitals:   08/22/12 0600  BP: 114/67  Pulse: 65  Temp:   Resp: 22     General:  NAD  Eyes: PERRLA  ENT: no erythema or exudates, mucous membranes are moist, some tenderness over ethmoid sinus bilaterally  Neck: supple, no lymphadenopathy appreciated  Cardiovascular: s1, s2, rrrr  Respiratory: cta b  Abdomen: soft, obese, nt, bs+  Skin: no rashes  Musculoskeletal: no edema b/l  Psychiatric: normal affect, cooperative with exam  Neurologic: grossly intact, nonfocal  Labs on Admission:  Basic  Metabolic Panel:  Recent Labs Lab 08/22/12 0608  NA 138  K 3.9  CL 102  GLUCOSE 244*  BUN 19  CREATININE 1.10   Liver Function Tests: No results found for this basename: AST, ALT, ALKPHOS, BILITOT, PROT, ALBUMIN,  in the last 168 hours No results found for this basename: LIPASE, AMYLASE,  in the last 168 hours No results found for this basename: AMMONIA,  in the last 168 hours CBC:  Recent Labs Lab 08/22/12 0550 08/22/12 0608  WBC 10.0  --   HGB 13.9 15.0  HCT 41.4 44.0  MCV 79.0  --   PLT 255  --    Cardiac Enzymes: No results found for this basename: CKTOTAL, CKMB, CKMBINDEX, TROPONINI,  in the last 168 hours BNP: No components found with this basename: POCBNP,  CBG:  Recent Labs Lab 08/22/12 0546  GLUCAP 248*    Radiological Exams on Admission: Dg Chest Portable 1 View  08/22/2012   *RADIOLOGY REPORT*  Clinical Data: Shortness of breath.  PORTABLE CHEST - 1  VIEW  Comparison: 07/14/2012.  Findings: Chronic cardiomegaly.  Unchanged upper mediastinal contours, distorted by rightward rotation.  Negative for edema, effusion, pneumothorax, or focal opacity.  Chronic, symmetric widening of the AC joints.  IMPRESSION: 1. No evidence of acute cardiopulmonary disease.  2.  Chronic cardiomegaly.   Original Report Authenticated By: Tiburcio Pea    EKG: Independently reviewed. Sinus rhythm with non specific T wave inversions, unchanged from prior EKG  Time spent:  MEMON,JEHANZEB [facility] Pager [phone]  If 7PM-7AM, please contact night-coverage [email or web address] Password [facility] 08/22/2012, 8:09 AM

## 2012-08-22 NOTE — ED Provider Notes (Signed)
CSN: [id number]     Arrival date & time 08/22/12  1191 History     First MD Initiated Contact with Patient 08/22/12 0537     Chief Complaint  Patient presents with  . Hyperglycemia   (Consider location/radiation/quality/duration/timing/severity/associated sxs/prior Treatment) HPI History provided by patient. History of diabetes, hypertension and MI about a month ago. Woke up today with some moderate shortness of breath and reports blood sugar in the 400s. No chest pain. No cough or fevers. No recent illness otherwise. No leg pain or leg swelling. Symptoms with heart attack a month ago were chest pain radiating to his back in his arm. Today he has some nonradiating right-sided chest pain with these symptoms. He denies any similar chest pain or similar MI symptoms today otherwise.  Past Medical History  Diagnosis Date  . Hypertension   . Diabetes mellitus without complication   . Hyperlipidemia   . Coronary artery disease   . MI, old    Past Surgical History  Procedure Laterality Date  . Coronary angioplasty with stent placement     History reviewed. No pertinent family history. History  Substance Use Topics  . Smoking status: Current Every Day Smoker    Types: Cigarettes  . Smokeless tobacco: Not on file  . Alcohol Use: Yes     Comment: nothing in 2 weeks    Review of Systems  Constitutional: Negative for fever and chills.  HENT: Negative for neck pain and neck stiffness.   Eyes: Negative for pain.  Respiratory: Positive for shortness of breath.   Cardiovascular: Negative for chest pain.  Gastrointestinal: Negative for abdominal pain.  Genitourinary: Negative for dysuria.  Musculoskeletal: Negative for back pain.  Skin: Negative for rash.  Neurological: Negative for headaches.  All other systems reviewed and are negative.    Allergies  Review of patient's allergies indicates no known allergies.  Home Medications   Current Outpatient Rx  Name  Route  Sig   Dispense  Refill  . amLODipine (NORVASC) 5 MG tablet   Oral   Take 1 tablet (5 mg total) by mouth daily.   30 tablet   11   . aspirin 81 MG chewable tablet   Oral   Chew 1 tablet (81 mg total) by mouth daily.         Marland Kitchen atorvastatin (LIPITOR) 80 MG tablet   Oral   Take 1 tablet (80 mg total) by mouth daily.   30 tablet   11   . buPROPion (WELLBUTRIN SR) 150 MG 12 hr tablet   Oral   Take 1 tablet (150 mg total) by mouth 2 (two) times daily.   60 tablet   11   . gabapentin (NEURONTIN) 600 MG tablet   Oral   Take 300 mg by mouth 3 (three) times daily.         Marland Kitchen glipiZIDE (GLUCOTROL) 5 MG tablet   Oral   Take 10 mg by mouth 2 (two) times daily before a meal.         . HYDROcodone-acetaminophen (NORCO/VICODIN) 5-325 MG per tablet   Oral   Take 1 tablet by mouth 3 (three) times daily as needed for pain.          . metFORMIN (GLUCOPHAGE) 1000 MG tablet   Oral   Take 1 tablet (1,000 mg total) by mouth 2 (two) times daily with a meal. Do not restart Metformin until Wednesday am 7/2         . metoprolol tartrate (  LOPRESSOR) 25 MG tablet   Oral   Take 12.5 mg by mouth 2 (two) times daily.         . nitroGLYCERIN (NITROSTAT) 0.4 MG SL tablet   Sublingual   Place 1 tablet (0.4 mg total) under the tongue every 5 (five) minutes x 3 doses as needed for chest pain.   25 tablet   12   . Ticagrelor (BRILINTA) 90 MG TABS tablet   Oral   Take 1 tablet (90 mg total) by mouth 2 (two) times daily.   60 tablet   11   . Vitamin D, Ergocalciferol, (DRISDOL) 50000 UNITS CAPS   Oral   Take 50,000 Units by mouth 2 (two) times a week. Takes on Wednesday and Saturday.          There were no vitals taken for this visit. Physical Exam  Constitutional: He is oriented to person, place, and time. He appears well-developed and well-nourished.  HENT:  Head: Normocephalic and atraumatic.  Eyes: EOM are normal. Pupils are equal, round, and reactive to light.  Neck: Neck supple.   Cardiovascular: Normal rate, regular rhythm and intact distal pulses.   Pulmonary/Chest: Effort normal and breath sounds normal. No respiratory distress. He exhibits no tenderness.  Abdominal: Soft. He exhibits no distension. There is no tenderness.  Musculoskeletal: Normal range of motion. He exhibits no edema.  Neurological: He is alert and oriented to person, place, and time.  Skin: Skin is warm and dry.    ED Course   Procedures (including critical care time)  Results for orders placed during the hospital encounter of 08/22/12  CBC      Result Value Range   WBC 10.0  4.0 - 10.5 K/uL   RBC 5.24  4.22 - 5.81 MIL/uL   Hemoglobin 13.9  13.0 - 17.0 g/dL   HCT 40.9  81.1 - 91.4 %   MCV 79.0  78.0 - 100.0 fL   MCH 26.5  26.0 - 34.0 pg   MCHC 33.6  30.0 - 36.0 g/dL   RDW 78.2  95.6 - 21.3 %   Platelets 255  150 - 400 K/uL  GLUCOSE, CAPILLARY      Result Value Range   Glucose-Capillary 248 (*) 70 - 99 mg/dL  POCT I-STAT TROPONIN I      Result Value Range   Troponin i, poc 0.02  0.00 - 0.08 ng/mL   Comment 3            Dg Chest Portable 1 View  08/22/2012   *RADIOLOGY REPORT*  Clinical Data: Shortness of breath.  PORTABLE CHEST - 1 VIEW  Comparison: 07/14/2012.  Findings: Chronic cardiomegaly.  Unchanged upper mediastinal contours, distorted by rightward rotation.  Negative for edema, effusion, pneumothorax, or focal opacity.  Chronic, symmetric widening of the AC joints.  IMPRESSION: 1. No evidence of acute cardiopulmonary disease.  2.  Chronic cardiomegaly.   Original Report Authenticated By: Tiburcio Pea       Date: 08/22/2012  Rate: 68  Rhythm: normal sinus rhythm  QRS Axis: left  Intervals: normal  ST/T Wave abnormalities: nonspecific ST/T changes  Conduction Disutrbances:nonspecific intraventricular conduction delay  Narrative Interpretation:   Old EKG Reviewed: unchanged  ASA. Morphine - symptoms improved on recheck  MED c/s for admit MDM  CP/ SOB/ elevated  blood sugar at home, recent MI  ECG, labs, CXR Medications provided  MED admit    Sunnie Nielsen, MD 08/22/12 2303

## 2012-08-22 NOTE — ED Notes (Signed)
rn spoke to dr.memon, stated he will come to see the pt in the ed.

## 2012-08-22 NOTE — ED Notes (Signed)
Pt reporting he checked his blood sugar about 0430 this morning and stated results were >400.  Reports that he did take his medications last night.

## 2012-08-22 NOTE — ED Notes (Signed)
Dr.memon notified of new test results, will discharge the pt to home

## 2012-11-29 ENCOUNTER — Emergency Department (HOSPITAL_COMMUNITY)
Admission: EM | Admit: 2012-11-29 | Discharge: 2012-11-29 | Disposition: A | Discharge status: Home / Self Care | Payer: Medicare Other | Attending: Emergency Medicine | Admitting: Emergency Medicine

## 2012-11-29 ENCOUNTER — Encounter (HOSPITAL_COMMUNITY): Payer: Self-pay | Admitting: Emergency Medicine

## 2012-11-29 ENCOUNTER — Emergency Department (HOSPITAL_COMMUNITY): Payer: Medicare Other

## 2012-11-29 DIAGNOSIS — Z79899 Other long term (current) drug therapy: Secondary | ICD-10-CM | POA: Insufficient documentation

## 2012-11-29 DIAGNOSIS — Z9861 Coronary angioplasty status: Secondary | ICD-10-CM | POA: Insufficient documentation

## 2012-11-29 DIAGNOSIS — E119 Type 2 diabetes mellitus without complications: Secondary | ICD-10-CM | POA: Insufficient documentation

## 2012-11-29 DIAGNOSIS — E7800 Pure hypercholesterolemia, unspecified: Secondary | ICD-10-CM | POA: Insufficient documentation

## 2012-11-29 DIAGNOSIS — F17200 Nicotine dependence, unspecified, uncomplicated: Secondary | ICD-10-CM | POA: Insufficient documentation

## 2012-11-29 DIAGNOSIS — R079 Chest pain, unspecified: Secondary | ICD-10-CM

## 2012-11-29 DIAGNOSIS — I2510 Atherosclerotic heart disease of native coronary artery without angina pectoris: Secondary | ICD-10-CM | POA: Insufficient documentation

## 2012-11-29 DIAGNOSIS — R0602 Shortness of breath: Secondary | ICD-10-CM | POA: Insufficient documentation

## 2012-11-29 DIAGNOSIS — R072 Precordial pain: Secondary | ICD-10-CM | POA: Insufficient documentation

## 2012-11-29 DIAGNOSIS — Z7982 Long term (current) use of aspirin: Secondary | ICD-10-CM | POA: Insufficient documentation

## 2012-11-29 DIAGNOSIS — I10 Essential (primary) hypertension: Secondary | ICD-10-CM | POA: Insufficient documentation

## 2012-11-29 DIAGNOSIS — K625 Hemorrhage of anus and rectum: Secondary | ICD-10-CM | POA: Insufficient documentation

## 2012-11-29 DIAGNOSIS — I252 Old myocardial infarction: Secondary | ICD-10-CM | POA: Insufficient documentation

## 2012-11-29 LAB — CBC WITH DIFFERENTIAL/PLATELET
Basophils Absolute: 0 10*3/uL (ref 0.0–0.1)
Basophils Relative: 1 % (ref 0–1)
Eosinophils Absolute: 0.4 10*3/uL (ref 0.0–0.7)
Eosinophils Relative: 5 % (ref 0–5)
HCT: 43.9 % (ref 39.0–52.0)
MCH: 26.8 pg (ref 26.0–34.0)
MCHC: 33.3 g/dL (ref 30.0–36.0)
MCV: 80.6 fL (ref 78.0–100.0)
Monocytes Absolute: 0.4 10*3/uL (ref 0.1–1.0)
Monocytes Relative: 4 % (ref 3–12)
RDW: 15.3 % (ref 11.5–15.5)

## 2012-11-29 LAB — BASIC METABOLIC PANEL
BUN: 19 mg/dL (ref 6–23)
Calcium: 10 mg/dL (ref 8.4–10.5)
Creatinine, Ser: 1.22 mg/dL (ref 0.50–1.35)
GFR calc Af Amer: 74 mL/min — ABNORMAL LOW (ref >90)

## 2012-11-29 LAB — POCT I-STAT TROPONIN I: Troponin i, poc: 0 ng/mL (ref 0.00–0.08)

## 2012-11-29 MED ORDER — HYDROCODONE-ACETAMINOPHEN 5-325 MG PO TABS: 1.0000 | ORAL_TABLET | Freq: Four times a day (QID) | ORAL | Status: DC | PRN

## 2012-11-29 MED ORDER — HYDROMORPHONE HCL PF 1 MG/ML IJ SOLN
0.5000 mg | Freq: Once | INTRAMUSCULAR | Status: AC
  Administered 2012-11-29: 0.5 mg via INTRAVENOUS
  Filled 2012-11-29: qty 1

## 2012-11-29 MED ORDER — NITROGLYCERIN 0.4 MG SL SUBL
SUBLINGUAL_TABLET | SUBLINGUAL | Status: AC
  Administered 2012-11-29: 0.4 mg via SUBLINGUAL
  Filled 2012-11-29: qty 25

## 2012-11-29 MED ORDER — ASPIRIN 81 MG PO CHEW: 324.0000 mg | CHEWABLE_TABLET | Freq: Once | ORAL | Status: AC

## 2012-11-29 MED ORDER — ASPIRIN 81 MG PO CHEW
CHEWABLE_TABLET | ORAL | Status: AC
  Administered 2012-11-29: 324 mg via ORAL
  Filled 2012-11-29: qty 4

## 2012-11-29 MED ORDER — NITROGLYCERIN 0.4 MG SL SUBL: 0.4000 mg | SUBLINGUAL_TABLET | Freq: Once | SUBLINGUAL | Status: AC

## 2012-11-29 NOTE — ED Provider Notes (Addendum)
CSN: [id number]     Arrival date & time 11/29/12  1018 History  This chart was scribed for Gerhard Munch, MD by Quintella Reichert, ED scribe.  This patient was seen in room APA02/APA02 and the patient's care was started at 10:55 AM.   Chief Complaint  Patient presents with  . Chest Pain    The history is provided by the patient. No language interpreter was used.    HPI Comments: [name] is a 57 y.o. male with h/o MI, CAD, DM, HTN, and hyperlipidemia who presents to the Emergency Department complaining of gradual-onset, constant, waxing-and-waning mid-sternal chest pain that began yesterday.  Pain radiates somewhat into the left side of his chest and directly into his back..  It has been waxing and waning since onset.  CP is associated with SOB, intermittent headache, and an intermittent cough which he states is also new since his pain began.  He states he also vomited one time yesterday but denies nausea or vomiting today.  He notes that he has also seen red stools for the past 4 days which is new for him.  He admits to prior h/o hemorrhoids which come and go.  He denies fever, rash, lower extremity swelling, syncope, confusion, speech difficulty, or urinary symptoms.  Pt had a stent placed several months ago subsequent to an NSTEMI.  He is taking aspirin.    Past Medical History  Diagnosis Date  . Hypertension   . Diabetes mellitus without complication   . Hyperlipidemia   . Coronary artery disease   . MI, old     Past Surgical History  Procedure Laterality Date  . Coronary angioplasty with stent placement      No family history on file.   History  Substance Use Topics  . Smoking status: Current Every Day Smoker    Types: Cigarettes  . Smokeless tobacco: Not on file  . Alcohol Use: Yes     Comment: occ     Review of Systems  Constitutional:       Per HPI, otherwise negative  HENT:       Per HPI, otherwise negative  Respiratory:       Per HPI, otherwise  negative  Cardiovascular:       Per HPI, otherwise negative  Endocrine:       Negative aside from HPI  Genitourinary:       Neg aside from HPI   Musculoskeletal:       Per HPI, otherwise negative  Skin: Negative.   Neurological: Negative for syncope.     Allergies  Review of patient's allergies indicates no known allergies.  Home Medications   Current Outpatient Rx  Name  Route  Sig  Dispense  Refill  . amLODipine (NORVASC) 5 MG tablet   Oral   Take 1 tablet (5 mg total) by mouth daily.   30 tablet   11   . aspirin EC 81 MG tablet   Oral   Take 81 mg by mouth 2 (two) times daily.         Marland Kitchen atorvastatin (LIPITOR) 80 MG tablet   Oral   Take 1 tablet (80 mg total) by mouth daily.   30 tablet   11   . buPROPion (WELLBUTRIN SR) 150 MG 12 hr tablet   Oral   Take 1 tablet (150 mg total) by mouth 2 (two) times daily.   60 tablet   11   . gabapentin (NEURONTIN) 600 MG tablet  Oral   Take 300 mg by mouth 3 (three) times daily.         Marland Kitchen glipiZIDE (GLUCOTROL) 5 MG tablet   Oral   Take 10 mg by mouth 2 (two) times daily before a meal.         . HYDROcodone-acetaminophen (NORCO/VICODIN) 5-325 MG per tablet   Oral   Take 1 tablet by mouth 3 (three) times daily as needed for pain.          . isosorbide mononitrate (IMDUR) 30 MG 24 hr tablet   Oral   Take 30 mg by mouth daily.         Marland Kitchen lisinopril (PRINIVIL,ZESTRIL) 5 MG tablet   Oral   Take 5 mg by mouth daily.         . metFORMIN (GLUCOPHAGE) 1000 MG tablet   Oral   Take 1 tablet (1,000 mg total) by mouth 2 (two) times daily with a meal. Do not restart Metformin until Wednesday am 7/2         . metoprolol tartrate (LOPRESSOR) 25 MG tablet   Oral   Take 25 mg by mouth 2 (two) times daily. **hold if heart rate less than 50**         . nitroGLYCERIN (NITROSTAT) 0.4 MG SL tablet   Sublingual   Place 1 tablet (0.4 mg total) under the tongue every 5 (five) minutes x 3 doses as needed for chest  pain.   25 tablet   12   . Ticagrelor (BRILINTA) 90 MG TABS tablet   Oral   Take 1 tablet (90 mg total) by mouth 2 (two) times daily.   60 tablet   11   . Vitamin D, Ergocalciferol, (DRISDOL) 50000 UNITS CAPS   Oral   Take 50,000 Units by mouth 2 (two) times a week. Takes on Wednesday and Saturday.          BP 129/94  Pulse 68  Temp(Src) 97.9 F (36.6 C) (Oral)  Resp 26  SpO2 98%  Physical Exam  Nursing note and vitals reviewed. Constitutional: He is oriented to person, place, and time. He appears well-developed. No distress.  HENT:  Head: Normocephalic and atraumatic.  Eyes: Conjunctivae and EOM are normal.  Cardiovascular: Normal rate, regular rhythm and normal heart sounds.   No murmur heard. Pulmonary/Chest: Effort normal and breath sounds normal. No stridor. No respiratory distress. He has no wheezes. He has no rales.  Abdominal: He exhibits no distension. There is tenderness (RLQ).  Genitourinary: Rectal exam shows external hemorrhoid. Guaiac positive stool.  Large external hemorrhoid, not thrombosed.  Stool grossly heme-negative but guaiac positive.  Musculoskeletal: He exhibits no edema.  Neurological: He is alert and oriented to person, place, and time.  Skin: Skin is warm and dry.  Psychiatric: He has a normal mood and affect.    ED Course  Procedures (including critical care time)  DIAGNOSTIC STUDIES: Oxygen Saturation is 98% on room air, normal by my interpretation.    COORDINATION OF CARE: 11:04 AM-Discussed treatment plan which includes cardiac workup with pt at bedside and pt agreed to plan.    2:32 PM Patient is in no distress on repeat exam.  She states that her symptoms have resolved.  We discussed pain management, follow up care, return precautions.  Labs Review Labs Reviewed  BASIC METABOLIC PANEL - Abnormal; Notable for the following:    Glucose, Bld 225 (*)    GFR calc non Af Amer 64 (*)  GFR calc Af Amer 74 (*)    All other  components within normal limits  OCCULT BLOOD, POC DEVICE - Abnormal; Notable for the following:    Fecal Occult Bld POSITIVE (*)    All other components within normal limits  CBC WITH DIFFERENTIAL  POCT I-STAT TROPONIN I    Imaging Review Dg Chest Portable 1 View  11/29/2012   CLINICAL DATA:  Chest pain since yesterday.  EXAM: PORTABLE CHEST - 1 VIEW  COMPARISON:  08/22/2012.  FINDINGS: Marked chronic enlargement of the cardiac silhouette. Mild vascular congestion without focal infiltrates or overt failure. Unremarkable osseous structures. Slight worsening aeration compared with priors.  IMPRESSION: Cardiomegaly, no active infiltrates.   Electronically Signed   By: Davonna Belling M.D.   On: 11/29/2012 10:54    EKG Interpretation    Date/Time:  Thursday November 29 2012 10:20:31 EST Ventricular Rate:  73 PR Interval:  152 QRS Duration: 80 QT Interval:  390 QTC Calculation: 429 R Axis:   2 Text Interpretation:  Normal sinus rhythm Nonspecific T wave abnormality Abnormal ECG When compared with ECG of 22-Aug-2012 05:42, QRS axis Shifted right Non-specific change in ST segment in Inferior leads  - likely explained by artefact Abnormal ekg Confirmed by Gerhard Munch  MD ([id number]) on 11/29/2012 12:08:05 PM            MDM  No diagnosis found.  I personally performed the services described in this documentation, which was scribed in my presence. The recorded information has been reviewed and is accurate.  This patient presents with concerns of chest pain and rectal bleeding.  On exam the patient is awake and alert, in no distress, hemodynamically stable.  Patient has only occult blood, no evidence of decompensation.  EKG is unremarkable, labs are unremarkable, and the patient is compliant with his medications, including Ticagrelor which may be contributing to the occult bleeding.  With the resolution of symptoms, reassuring evaluation, there is low suspicion for ongoing coronary ischemia.   Patient has a followup capacity, and absent any distress, he is appropriate for discharge with further evaluation and management as an outpatient.   Gerhard Munch, MD 11/29/12 1433  Gerhard Munch, MD 11/29/12 [phone]

## 2012-11-29 NOTE — ED Notes (Signed)
Patient c/o headache. MD aware. Orders obtained.

## 2012-11-29 NOTE — ED Notes (Signed)
Pt c/o chest pain radiating to back with sob, n/v since yesterday.  Reports thought may be gas because has been eating pinto beans for the past several days.  Also reports seeing bright red blood in stool for the past 4 days.

## 2012-11-29 NOTE — ED Notes (Signed)
Patient with no complaints at this time. Respirations even and unlabored. Skin warm/dry. Discharge instructions reviewed with patient at this time. Patient given opportunity to voice concerns/ask questions. IV removed per policy and band-aid applied to site. Patient discharged at this time and left Emergency Department with steady gait.  

## 2013-09-06 ENCOUNTER — Encounter (HOSPITAL_COMMUNITY)
Admission: EM | Disposition: A | Discharge status: Home / Self Care | Payer: Medicare Other | Source: Home / Self Care | Attending: Cardiology

## 2013-09-06 ENCOUNTER — Encounter (HOSPITAL_COMMUNITY): Payer: Self-pay | Admitting: Emergency Medicine

## 2013-09-06 ENCOUNTER — Inpatient Hospital Stay (HOSPITAL_COMMUNITY)
Admission: EM | Admit: 2013-09-06 | Discharge: 2013-09-09 | DRG: 246 | Disposition: A | Discharge status: Home / Self Care | Payer: Medicare Other | Attending: Cardiology | Admitting: Cardiology

## 2013-09-06 DIAGNOSIS — Z6835 Body mass index (BMI) 35.0-35.9, adult: Secondary | ICD-10-CM

## 2013-09-06 DIAGNOSIS — N058 Unspecified nephritic syndrome with other morphologic changes: Secondary | ICD-10-CM | POA: Diagnosis present

## 2013-09-06 DIAGNOSIS — R570 Cardiogenic shock: Secondary | ICD-10-CM | POA: Diagnosis present

## 2013-09-06 DIAGNOSIS — E1122 Type 2 diabetes mellitus with diabetic chronic kidney disease: Secondary | ICD-10-CM | POA: Diagnosis present

## 2013-09-06 DIAGNOSIS — E785 Hyperlipidemia, unspecified: Secondary | ICD-10-CM | POA: Diagnosis present

## 2013-09-06 DIAGNOSIS — T82897A Other specified complication of cardiac prosthetic devices, implants and grafts, initial encounter: Secondary | ICD-10-CM | POA: Diagnosis present

## 2013-09-06 DIAGNOSIS — N189 Chronic kidney disease, unspecified: Secondary | ICD-10-CM | POA: Diagnosis present

## 2013-09-06 DIAGNOSIS — E1129 Type 2 diabetes mellitus with other diabetic kidney complication: Secondary | ICD-10-CM | POA: Diagnosis present

## 2013-09-06 DIAGNOSIS — I25110 Atherosclerotic heart disease of native coronary artery with unstable angina pectoris: Secondary | ICD-10-CM

## 2013-09-06 DIAGNOSIS — E669 Obesity, unspecified: Secondary | ICD-10-CM | POA: Diagnosis present

## 2013-09-06 DIAGNOSIS — N183 Chronic kidney disease, stage 3 unspecified: Secondary | ICD-10-CM | POA: Diagnosis present

## 2013-09-06 DIAGNOSIS — I25119 Atherosclerotic heart disease of native coronary artery with unspecified angina pectoris: Secondary | ICD-10-CM

## 2013-09-06 DIAGNOSIS — I2510 Atherosclerotic heart disease of native coronary artery without angina pectoris: Secondary | ICD-10-CM | POA: Diagnosis present

## 2013-09-06 DIAGNOSIS — Y831 Surgical operation with implant of artificial internal device as the cause of abnormal reaction of the patient, or of later complication, without mention of misadventure at the time of the procedure: Secondary | ICD-10-CM | POA: Diagnosis present

## 2013-09-06 DIAGNOSIS — I209 Angina pectoris, unspecified: Secondary | ICD-10-CM

## 2013-09-06 DIAGNOSIS — Z720 Tobacco use: Secondary | ICD-10-CM

## 2013-09-06 DIAGNOSIS — I129 Hypertensive chronic kidney disease with stage 1 through stage 4 chronic kidney disease, or unspecified chronic kidney disease: Secondary | ICD-10-CM | POA: Diagnosis present

## 2013-09-06 DIAGNOSIS — I2109 ST elevation (STEMI) myocardial infarction involving other coronary artery of anterior wall: Secondary | ICD-10-CM | POA: Diagnosis not present

## 2013-09-06 DIAGNOSIS — I2589 Other forms of chronic ischemic heart disease: Secondary | ICD-10-CM | POA: Diagnosis present

## 2013-09-06 DIAGNOSIS — E1142 Type 2 diabetes mellitus with diabetic polyneuropathy: Secondary | ICD-10-CM | POA: Diagnosis present

## 2013-09-06 DIAGNOSIS — F17200 Nicotine dependence, unspecified, uncomplicated: Secondary | ICD-10-CM | POA: Diagnosis present

## 2013-09-06 DIAGNOSIS — E1149 Type 2 diabetes mellitus with other diabetic neurological complication: Secondary | ICD-10-CM | POA: Diagnosis present

## 2013-09-06 DIAGNOSIS — Z9861 Coronary angioplasty status: Secondary | ICD-10-CM

## 2013-09-06 DIAGNOSIS — I255 Ischemic cardiomyopathy: Secondary | ICD-10-CM | POA: Diagnosis present

## 2013-09-06 HISTORY — DX: Type 2 diabetes mellitus without complications: E11.9

## 2013-09-06 HISTORY — DX: Obesity, unspecified: E66.9

## 2013-09-06 HISTORY — PX: PERCUTANEOUS CORONARY STENT INTERVENTION (PCI-S): SHX5485

## 2013-09-06 HISTORY — DX: Atherosclerotic heart disease of native coronary artery without angina pectoris: I25.10

## 2013-09-06 HISTORY — DX: Essential (primary) hypertension: I10

## 2013-09-06 HISTORY — PX: LEFT HEART CATHETERIZATION WITH CORONARY ANGIOGRAM: SHX5451

## 2013-09-06 HISTORY — DX: Tobacco use: Z72.0

## 2013-09-06 LAB — COMPREHENSIVE METABOLIC PANEL
ALT: 28 U/L (ref 0–53)
AST: 101 U/L — ABNORMAL HIGH (ref 0–37)
Albumin: 3.7 g/dL (ref 3.5–5.2)
Alkaline Phosphatase: 106 U/L (ref 39–117)
Anion gap: 20 — ABNORMAL HIGH (ref 5–15)
BUN: 18 mg/dL (ref 6–23)
CALCIUM: 9.5 mg/dL (ref 8.4–10.5)
CO2: 21 mEq/L (ref 19–32)
Chloride: 95 mEq/L — ABNORMAL LOW (ref 96–112)
Creatinine, Ser: 1.38 mg/dL — ABNORMAL HIGH (ref 0.50–1.35)
GFR calc non Af Amer: 54 mL/min — ABNORMAL LOW (ref >90)
GFR, EST AFRICAN AMERICAN: 63 mL/min — AB (ref >90)
GLUCOSE: 384 mg/dL — AB (ref 70–99)
POTASSIUM: 4.7 meq/L (ref 3.7–5.3)
Sodium: 136 mEq/L — ABNORMAL LOW (ref 137–147)
TOTAL PROTEIN: 8.3 g/dL (ref 6.0–8.3)
Total Bilirubin: 0.3 mg/dL (ref 0.3–1.2)

## 2013-09-06 LAB — CBC
HCT: 46.7 % (ref 39.0–52.0)
Hemoglobin: 16.1 g/dL (ref 13.0–17.0)
MCH: 26.5 pg (ref 26.0–34.0)
MCHC: 34.5 g/dL (ref 30.0–36.0)
MCV: 76.8 fL — ABNORMAL LOW (ref 78.0–100.0)
PLATELETS: 246 10*3/uL (ref 150–400)
RBC: 6.08 MIL/uL — ABNORMAL HIGH (ref 4.22–5.81)
RDW: 15.7 % — ABNORMAL HIGH (ref 11.5–15.5)
WBC: 13.4 10*3/uL — ABNORMAL HIGH (ref 4.0–10.5)

## 2013-09-06 LAB — GLUCOSE, CAPILLARY
Glucose-Capillary: 335 mg/dL — ABNORMAL HIGH (ref 70–99)
Glucose-Capillary: 212 mg/dL — ABNORMAL HIGH (ref 70–99)

## 2013-09-06 LAB — PROTIME-INR
INR: 1.04 (ref 0.00–1.49)
Prothrombin Time: 13.6 seconds (ref 11.6–15.2)

## 2013-09-06 LAB — POCT I-STAT, CHEM 8
BUN: 19 mg/dL (ref 6–23)
CHLORIDE: 100 meq/L (ref 96–112)
CREATININE: 1.5 mg/dL — AB (ref 0.50–1.35)
Calcium, Ion: 1.17 mmol/L (ref 1.12–1.23)
Glucose, Bld: 399 mg/dL — ABNORMAL HIGH (ref 70–99)
HCT: 53 % — ABNORMAL HIGH (ref 39.0–52.0)
Hemoglobin: 18 g/dL — ABNORMAL HIGH (ref 13.0–17.0)
POTASSIUM: 4.7 meq/L (ref 3.7–5.3)
Sodium: 137 mEq/L (ref 137–147)
TCO2: 24 mmol/L (ref 0–100)

## 2013-09-06 LAB — POCT ACTIVATED CLOTTING TIME: Activated Clotting Time: 383 seconds

## 2013-09-06 LAB — MRSA PCR SCREENING: MRSA by PCR: NEGATIVE

## 2013-09-06 LAB — CK TOTAL AND CKMB (NOT AT ARMC)
CK, MB: 143.5 ng/mL — AB (ref 0.3–4.0)
Relative Index: 8.8 — ABNORMAL HIGH (ref 0.0–2.5)
Total CK: 1633 U/L — ABNORMAL HIGH (ref 7–232)

## 2013-09-06 LAB — TROPONIN I: TROPONIN I: 14.47 ng/mL — AB (ref <0.30)

## 2013-09-06 SURGERY — LEFT HEART CATHETERIZATION WITH CORONARY ANGIOGRAM
Anesthesia: LOCAL

## 2013-09-06 MED ORDER — PRASUGREL HCL 10 MG PO TABS
60.0000 mg | ORAL_TABLET | Freq: Once | ORAL | Status: DC
Start: 2013-09-07 — End: 2013-09-06

## 2013-09-06 MED ORDER — ONDANSETRON HCL 4 MG/2ML IJ SOLN
4.0000 mg | Freq: Four times a day (QID) | INTRAMUSCULAR | Status: DC | PRN
  Filled 2013-09-06: qty 2

## 2013-09-06 MED ORDER — MIDAZOLAM HCL 2 MG/2ML IJ SOLN
INTRAMUSCULAR | Status: AC
  Filled 2013-09-06: qty 2

## 2013-09-06 MED ORDER — OXYCODONE-ACETAMINOPHEN 5-325 MG PO TABS
1.0000 | ORAL_TABLET | Freq: Once | ORAL | Status: AC
  Administered 2013-09-07: 1 via ORAL
  Filled 2013-09-06: qty 1

## 2013-09-06 MED ORDER — FENTANYL CITRATE 0.05 MG/ML IJ SOLN
INTRAMUSCULAR | Status: AC
  Filled 2013-09-06: qty 2

## 2013-09-06 MED ORDER — INSULIN ASPART 100 UNIT/ML ~~LOC~~ SOLN
0.0000 [IU] | Freq: Three times a day (TID) | SUBCUTANEOUS | Status: DC
  Administered 2013-09-06: 11 [IU] via SUBCUTANEOUS
  Administered 2013-09-07: 5 [IU] via SUBCUTANEOUS
  Administered 2013-09-07: 11 [IU] via SUBCUTANEOUS
  Administered 2013-09-07: 5 [IU] via SUBCUTANEOUS
  Administered 2013-09-08 (×2): 8 [IU] via SUBCUTANEOUS
  Administered 2013-09-08 – 2013-09-09 (×3): 5 [IU] via SUBCUTANEOUS

## 2013-09-06 MED ORDER — VERAPAMIL HCL 2.5 MG/ML IV SOLN
INTRAVENOUS | Status: AC
  Filled 2013-09-06: qty 2

## 2013-09-06 MED ORDER — INSULIN ASPART 100 UNIT/ML ~~LOC~~ SOLN
0.0000 [IU] | Freq: Every day | SUBCUTANEOUS | Status: DC
  Administered 2013-09-06: 2 [IU] via SUBCUTANEOUS
  Administered 2013-09-07: 3 [IU] via SUBCUTANEOUS

## 2013-09-06 MED ORDER — SODIUM CHLORIDE 0.9 % IV SOLN
1.0000 mL/kg/h | INTRAVENOUS | Status: AC
  Administered 2013-09-06: 1 mL/kg/h via INTRAVENOUS

## 2013-09-06 MED ORDER — LIDOCAINE HCL (PF) 1 % IJ SOLN
INTRAMUSCULAR | Status: AC
Start: 2013-09-06 — End: 2013-09-06
  Filled 2013-09-06: qty 30

## 2013-09-06 MED ORDER — ACETAMINOPHEN 325 MG PO TABS
650.0000 mg | ORAL_TABLET | ORAL | Status: DC | PRN
  Administered 2013-09-06: 650 mg via ORAL
  Filled 2013-09-06: qty 2

## 2013-09-06 MED ORDER — INSULIN ASPART 100 UNIT/ML ~~LOC~~ SOLN: 0.0000 [IU] | Freq: Three times a day (TID) | SUBCUTANEOUS | Status: DC

## 2013-09-06 MED ORDER — ASPIRIN 81 MG PO CHEW
81.0000 mg | CHEWABLE_TABLET | Freq: Every day | ORAL | Status: DC
  Administered 2013-09-07 – 2013-09-09 (×3): 81 mg via ORAL
  Filled 2013-09-06 (×3): qty 1

## 2013-09-06 MED ORDER — BIVALIRUDIN 250 MG IV SOLR
INTRAVENOUS | Status: AC
  Filled 2013-09-06: qty 250

## 2013-09-06 MED ORDER — VERAPAMIL HCL 2.5 MG/ML IV SOLN
INTRAVENOUS | Status: AC
Start: 2013-09-06 — End: 2013-09-06
  Filled 2013-09-06: qty 2

## 2013-09-06 MED ORDER — OXYCODONE HCL 5 MG PO TABS
5.0000 mg | ORAL_TABLET | Freq: Once | ORAL | Status: AC
  Administered 2013-09-06: 5 mg via ORAL
  Filled 2013-09-06: qty 1

## 2013-09-06 MED ORDER — HEPARIN SODIUM (PORCINE) 1000 UNIT/ML IJ SOLN
INTRAMUSCULAR | Status: AC
  Filled 2013-09-06: qty 1

## 2013-09-06 MED ORDER — HEPARIN (PORCINE) IN NACL 2-0.9 UNIT/ML-% IJ SOLN
INTRAMUSCULAR | Status: AC
Start: 2013-09-06 — End: 2013-09-06
  Filled 2013-09-06: qty 1500

## 2013-09-06 MED ORDER — PRASUGREL HCL 10 MG PO TABS
10.0000 mg | ORAL_TABLET | Freq: Every day | ORAL | Status: DC
  Administered 2013-09-07 – 2013-09-09 (×3): 10 mg via ORAL
  Filled 2013-09-06 (×4): qty 1

## 2013-09-06 MED ORDER — OXYCODONE-ACETAMINOPHEN 5-325 MG PO TABS
1.0000 | ORAL_TABLET | Freq: Once | ORAL | Status: AC
  Administered 2013-09-06: 1 via ORAL
  Filled 2013-09-06: qty 1

## 2013-09-06 MED ORDER — NITROGLYCERIN 2 % TD OINT
1.0000 [in_us] | TOPICAL_OINTMENT | Freq: Four times a day (QID) | TRANSDERMAL | Status: DC
  Administered 2013-09-06 – 2013-09-07 (×4): 1 [in_us] via TOPICAL
  Filled 2013-09-06: qty 30

## 2013-09-06 MED ORDER — NOREPINEPHRINE BITARTRATE 1 MG/ML IV SOLN
2.0000 ug/min | INTRAVENOUS | Status: AC
  Filled 2013-09-06: qty 4

## 2013-09-06 MED ORDER — NITROGLYCERIN 1 MG/10 ML FOR IR/CATH LAB
INTRA_ARTERIAL | Status: AC
  Filled 2013-09-06: qty 10

## 2013-09-06 MED ORDER — BIVALIRUDIN 250 MG IV SOLR: 0.2500 mg/kg/h | INTRAVENOUS | Status: AC

## 2013-09-06 MED ORDER — SODIUM CHLORIDE 0.9 % IV SOLN
0.2500 mg/kg/h | INTRAVENOUS | Status: DC
  Filled 2013-09-06: qty 250

## 2013-09-06 MED ORDER — TICAGRELOR 90 MG PO TABS
ORAL_TABLET | ORAL | Status: AC
  Filled 2013-09-06: qty 2

## 2013-09-06 NOTE — H&P (Signed)
Patient with another medical record. Data obtained from old chart in [place].  HPI:  [name] is a 58 y/o male with DM2, HTN, HL,smoker and CAD admitted through ER with subacute anterolateral STEMI with ongoing CP and shock.   Had NSTEMI in 6/14 and was found to have 99% thrombotic occlusion of mLAD with sequential 80% stenosis and is s/p DES to LAD. He also had 80-90% OM2 stenosis as well as moderate diffuse disease in RCA with 50-70% stenosis of PDA that weretreated medically; LVEF was 40% by ventriculogram. Readmitted 7/14 with recurrent CP. CE negative. Myoview with apical thinning. EF 52% no ischemia.   Apparently was doing well until yesterday developed CP that was on/off. Today pain got much worse and called 911. On arrival patient with severe CP. Given NTG. Became hypotensive. ECG with anterolateral Q waves and ST elevation. Brought to ER. SBP 70-80. Diaphoretic with ongoing CP and lethargy. Hard to get history.    Review of Systems:     Cardiac Review of Systems: {Y] = yes [ ]  = no  Chest Pain [  y  ]  Resting SOB [ y  ] Exertional SOB  [  ]  Orthopnea [  ]   Pedal Edema [   ]    Palpitations [  ] Syncope  [  ]   Presyncope [   ]  General Review of Systems: [Y] = yes [  ]=no Constitional: recent weight change [  ]; anorexia [  ]; fatigue [ y ]; nausea [  ]; night sweats [  ]; fever [  ]; or chills [  ];                                                                                                                                          Dental: poor dentition[  ];  Eye : blurred vision [  ]; diplopia [   ]; vision changes [  ];  Amaurosis fugax[  ]; Resp: cough [  ];  wheezing[  ];  hemoptysis[  ]; shortness of breath[ y ]; paroxysmal nocturnal dyspnea[  ]; dyspnea on exertion[  ]; or orthopnea[y  ];  GI:  gallstones[  ], vomiting[  ];  dysphagia[  ]; melena[  ];  hematochezia [  ]; heartburn[  ];   Hx of  Colonoscopy[  ]; GU: kidney stones [  ]; hematuria[  ];   dysuria [  ];   nocturia[  ];  history of     obstruction [  ];                 Skin: rash, swelling[  ];, hair loss[  ];  peripheral edema[  ];  or itching[  ]; Musculosketetal: myalgias[  ];  joint swelling[  ];  joint erythema[  ];  joint pain[y  ];  back pain[  ];  Heme/Lymph: bruising[  ];  bleeding[  ];  anemia[  ];  Neuro: TIA[  ];  headaches[  ];  stroke[  ];  vertigo[  ];  seizures[  ];   paresthesias[  ];  difficulty walking[  ];  Psych:depression[  ]; anxiety[  ];  Endocrine: diabetes[y  ];  thyroid dysfunction[  ];  Other:  Past Medical History  Diagnosis Date  . CAD (coronary artery disease)   . HTN (hypertension)   . Hyperlipemia   . DM2 (diabetes mellitus, type 2)   . Tobacco use   . Obesity     No prescriptions prior to admission     No Known Allergies  History   Social History  . Marital Status: Unknown    Spouse Name: N/A    Number of Children: N/A  . Years of Education: N/A   Occupational History  . Not on file.   Social History Main Topics  . Smoking status: Current Every Day Smoker  . Smokeless tobacco: Not on file  . Alcohol Use: No  . Drug Use: No  . Sexual Activity: Yes   Other Topics Concern  . Not on file   Social History Narrative  . No narrative on file    Family Hx: Unobtainable due to acute MI and altered MS  PHYSICAL EXAM: Filed Vitals:   09/06/13 1440  BP: 86/59  Pulse: 67   General:  Daiphoretic. Lethargic. Ongoing CP HEENT: normal Neck: supple. JVP elevated Carotids 2+ bilat; no bruits. No lymphadenopathy or thryomegaly appreciated. Cor: PMI nonpalpable. Regular rate & rhythm. Distant.  Lungs: rhonchi anteriorly Abdomen: soft, obese nontender, nondistended. No hepatosplenomegaly. No bruits or masses. Good bowel sounds. Extremities: cool no cyanosis, clubbing, rash, edema Neuro: lethargic. Answers some questions cranial nerves grossly intact. moves all 4 extremities w/o difficulty.  ECG: SR with anterolateral Q waves and persistent  ST elevation  Results for orders placed during the hospital encounter of 09/06/13 (from the past 24 hour(s))  CBC     Status: Abnormal   Collection Time    09/06/13  3:05 PM      Result Value Ref Range   WBC 13.4 (*) 4.0 - 10.5 K/uL   RBC 6.08 (*) 4.22 - 5.81 MIL/uL   Hemoglobin 16.1  13.0 - 17.0 g/dL   HCT 46.7  39.0 - 52.0 %   MCV 76.8 (*) 78.0 - 100.0 fL   MCH 26.5  26.0 - 34.0 pg   MCHC 34.5  30.0 - 36.0 g/dL   RDW 15.7 (*) 11.5 - 15.5 %   Platelets 246  150 - 400 K/uL  PROTIME-INR     Status: None   Collection Time    09/06/13  3:05 PM      Result Value Ref Range   Prothrombin Time 13.6  11.6 - 15.2 seconds   INR 1.04  0.00 - 1.49   No results found.   ASSESSMENT: 1. Subacute anterolateral STEM with ongoing CP 2. Cardiogenic shock 3. CAD s/p LAD PCI in 6/14 4. Tobacco use 5. HTN 6. HL   PLAN/DISCUSSION:  He is more than 12 hours out from onset of symptoms but with persistent CP and shock will need to go emergently to cath lab. Start levophed for BP support. Taken to cath lab with Dr. Irish Lack.  Russell Quince Audrianna Driskill,MD 3:44 PM

## 2013-09-06 NOTE — ED Notes (Addendum)
LEVOPHED STARTED AT 5 MCG/MIN. RATE 18.8/ML. PER DR/ BENSIMHON.

## 2013-09-06 NOTE — ED Provider Notes (Signed)
CSN: [id number]     Arrival date & time 09/06/13  1434 History   First MD Initiated Contact with Patient 09/06/13 1442     No chief complaint on file.    (Consider location/radiation/quality/duration/timing/severity/associated sxs/prior Treatment) HPI Patient presents as a code STEMI with active chest pain. The patient arrives EMS, diaphoretic, minimally interactive. Patient states the pain began yesterday, without clear precipitant.  Patient had transient improvement with nitroglycerin earlier today, but since that time has had increasing sternal chest pressure.  Per EMS, patient has been diaphoretic, uncomfortable since arrival, and after one tablet of nitroglycerin glycerin, became hypotensive.   Past Medical History  Diagnosis Date  . CAD (coronary artery disease)   . HTN (hypertension)   . Hyperlipemia   . DM2 (diabetes mellitus, type 2)   . Tobacco use   . Obesity    Past Surgical History  Procedure Laterality Date  . Coronary angioplasty with stent placement     No family history on file. History  Substance Use Topics  . Smoking status: Current Every Day Smoker  . Smokeless tobacco: Not on file  . Alcohol Use: No    Review of Systems  Unable to perform ROS: Acuity of condition      Allergies  Review of patient's allergies indicates no known allergies.  Home Medications   Prior to Admission medications   Not on File   BP 86/59  Pulse 67  Wt 249 lb 1.9 oz (113 kg)  SpO2 93% Physical Exam  Nursing note and vitals reviewed. Constitutional: He appears ill. He appears distressed.  Obese, elderly appearing male  Eyes: Right eye exhibits no discharge. Left eye exhibits no discharge.  Neck: No tracheal deviation present.  Cardiovascular: Normal rate and intact distal pulses.   Pulmonary/Chest: No stridor. Tachypnea noted. He has decreased breath sounds.  Neurological: He is alert.  Listless, but oriented x3.  Psychiatric: His mood appears anxious.     ED Course  Procedures (including critical care time) Labs Review Labs Reviewed  CBC - Abnormal; Notable for the following:    WBC 13.4 (*)    RBC 6.08 (*)    MCV 76.8 (*)    RDW 15.7 (*)    All other components within normal limits  COMPREHENSIVE METABOLIC PANEL - Abnormal; Notable for the following:    Sodium 136 (*)    Chloride 95 (*)    Glucose, Bld 384 (*)    Creatinine, Ser 1.38 (*)    AST 101 (*)    GFR calc non Af Amer 54 (*)    GFR calc Af Amer 63 (*)    Anion gap 20 (*)    All other components within normal limits  CK TOTAL AND CKMB - Abnormal; Notable for the following:    Total CK 1633 (*)    CK, MB 143.5 (*)    Relative Index 8.8 (*)    All other components within normal limits  TROPONIN I - Abnormal; Notable for the following:    Troponin I 14.47 (*)    All other components within normal limits  PROTIME-INR      EKG Interpretation   Date/Time:  Friday September 06 2013 14:37:41 EDT Ventricular Rate:  72 PR Interval:  148 QRS Duration: 84 QT Interval:  446 QTC Calculation: 488 R Axis:   -85 Text Interpretation:  Sinus rhythm Left anterior fascicular block  Anterolateral infarct, age indeterminate Baseline wander in lead(s) I II  aVR Sinus rhythm ST elevation in  anterior leads with q waves Abnormal ekg  Confirmed by Carmin Muskrat  MD [phone]) on 09/06/2013 4:16:32 PM      On arrival the patient was not registered correctly, and patient was incapable of providing initial details in the demographics. Subsequently, however we obtain the patient's records, and the patient confirmed that he had prior coronary intervention.  With ongoing pain, patient's initial care was conducted with our cardiology colleagues in the emergency department. Patient's rhythm strips from EMS demonstrated ST elevation in the anterior precordial leads, though without reciprocal depressions. Patient did have Q waves anteriorly as well. The concern for ongoing infarct, given the  patient's diaphoresis, ST elevations, patient was started on heparin, taken to the cath lab.   MDM   Final diagnoses:  ST elevation myocardial infarction (STEMI) of anterolateral wall, initial episode of care  Cardiogenic shock  Atherosclerosis of native coronary artery of native heart with angina pectoris  Tobacco user    Patient presents with ongoing chest pain in extremis. Given the patient's ongoing pain, EKG findings, history, patient was started on heparin drip in the emergency department, and after discussion with our cardiology team, taken to the catheterization lab for emergent intervention.  CRITICAL CARE Performed by: Carmin Muskrat Total critical care time: 35 Critical care time was exclusive of separately billable procedures and treating other patients. Critical care was necessary to treat or prevent imminent or life-threatening deterioration. Critical care was time spent personally by me on the following activities: development of treatment plan with patient and/or surrogate as well as nursing, discussions with consultants, evaluation of patient's response to treatment, examination of patient, obtaining history from patient or surrogate, ordering and performing treatments and interventions, ordering and review of laboratory studies, ordering and review of radiographic studies, pulse oximetry and re-evaluation of patient's condition.     Carmin Muskrat, MD 09/06/13 1620

## 2013-09-06 NOTE — ED Notes (Signed)
READY FOR CATH LAB 5.

## 2013-09-06 NOTE — Progress Notes (Signed)
Dr Oleta Mouse yeng called because patient is complaining of chest pain. EKG done and further orders received from Dr Oleta Mouse. Dr Oleta Mouse made aware of transient ekg changes.

## 2013-09-06 NOTE — Progress Notes (Signed)
eLink Physician-Brief Progress Note Patient Name: [name] DOB: 06-13-1955 MRN: [id number]   Date of Service  09/06/2013  HPI/Events of Note  58 y/o male with DM2, HTN, HL,smoker and CAD admitted through ER with subacute anterolateral STEMI with ongoing CP and shock.  Had NSTEMI in 6/14 and was found to have 99% thrombotic occlusion of mLAD with sequential 80% stenosis and is s/p DES to LAD. He also had 80-90% OM2 stenosis as well as moderate diffuse disease in RCA with 50-70% stenosis of PDA that weretreated medically; LVEF was 40% by ventriculogram. Readmitted 7/14 with recurrent CP. CE negative. Myoview with apical thinning. EF 52% no ischemia.  Apparently was doing well until yesterday developed CP that was on/off. Today pain got much worse and called 911. On arrival patient with severe CP. Given NTG. Became hypotensive. ECG with anterolateral Q waves and ST elevation. Brought to ER. SBP 70-80. Diaphoretic with ongoing CP and lethargy.   Patient had a left heart cath done with the following results:  1.Patent left main coronary artery. Late stent thrombosis of the mid left anterior descending arter stent. This was successfully treated with aspiration thrombectomy and then a 3.0 x 24 Promus drug-eluting stent placement, postdilated to 3.5 mm.   2.Mild disease in the left circumflex artery and large OM1. In the OM 2, there is severe disease.  3. Moderate mid right coronary artery disease. Severe disease in the distal RCA. Did not assess left ventricular systolic function. I anticipate it will be significantly decreased. LVEDP 27 mmHg.   Interventional Cardiology recs Continue dual antiplatelet therapy for at least a year and likely beyond. Will switch to Effient tomorrow. Brilinta was given at the time of the cath. Would like him to be on a once a day drug to help ensure compliance. He states that he had stopped all of his medicines last year do to headaches. He is trying to figure out  which medication was causing the headaches.   eICU Interventions  58 yo with STEMI s\p LHC. 1. STEMI - s\p LHC with  DES in the LM - cont with recs per interventional cardiology - monitor chest pain - pain monitoring and control - monitor for any secondary injury      Intervention Category Evaluation Type: New Patient Evaluation  [name] 09/06/2013, 5:06 PM

## 2013-09-06 NOTE — CV Procedure (Addendum)
PROCEDURE:  Left heart catheterization with selective coronary angiography, left ventriculogram.  PCI LAD; aspiration thrombectomy of the LAD  INDICATIONS:  Anterior STEMI- late presentation  The risks, benefits, and details of the procedure were explained to the patient.  The patient verbalized understanding and wanted to proceed. Emergency consent was obtained.  PROCEDURE TECHNIQUE:  After Xylocaine anesthesia a 27F slender sheath was placed in the right radial artery with a single anterior needle wall stick.    Right coronary angiography was done using a Williams right guide catheter.  Left coronary angiography was done using a CLS 3.0 guide catheter.  Left heart catheterization was done using a JR 4.  The intervention was performed. Angiomax was used for anticoagulation. ACT was used to check that the Angiomax was therapeutic. A TR band was used for hemostasis.   CONTRAST:  Total of 155 cc.  COMPLICATIONS:  None.    HEMODYNAMICS:  Aortic pressure was 121/88; LV pressure was 121/20; LVEDP 27.  There was no gradient between the left ventricle and aorta.    ANGIOGRAPHIC DATA:   The left main coronary artery is widely patent.  The left anterior descending artery is a large vessel which is occluded at the proximal edge of the prior stent.  After intervention, it was noted that there is mild to moderate diffuse disease in the mid to distal LAD.  Medium-sized diagonal vessel patent with a severe ostial stenosis.  The left circumflex artery is a large vessel proximally. There is a large first obtuse marginal vessel which appears patent. The second obtuse marginal has significant disease which is diffuse. The distal circumflex is medium size and has moderate disease.  The right coronary artery is a large, dominant vessel. There was 60% lesion in the mid RCA. At the crux, there is a focal 90% lesion. There is moderate disease extending into the PDA. The posterior lateral artery is medium  size.  LEFT VENTRICULOGRAM:  Left ventricular angiogram was not done.  LVEDP was 27 mmHg.    PCI NARRATIVE: A CLS 3.0 guiding catheters using his left main. A pro-water wire was placed across the area disease in the LAD. A Fetch aspiration catheter was used for 3 passes. Significant thrombus was removed.  TIMI-3 flow was restored. A 2.5 x 15 balloon was used to predilate. A 3.0 x 24 promus drug-eluting stent was used to stent the entire diseased area in the LAD. The stent was post dilated with a 3.5 x 20 noncompliant balloon. Multiple doses of intracoronary verapamil were given to help prevent no reflow.  IMPRESSIONS:  1. Patent left main coronary artery. 2. Late stent thrombosis of the mid left anterior descending arter stent. This was successfully treated with aspiration thrombectomy and then a 3.0 x 24 Promus drug-eluting stent placement, postdilated to 3.5 mm.  3. Mild disease in the left circumflex artery and large OM1. In the OM 2, there is severe disease.. 4. Moderate mid right coronary artery disease. Severe disease in the distal RCA. 5. Did not assess left ventricular systolic function.  I anticipate it will be significantly decreased. LVEDP 27 mmHg.    RECOMMENDATION:  Continue dual antiplatelet therapy for at least a year and likely beyond. Will switch to Effient tomorrow.  Brilinta was given at the time of the cath. Would like him to be on a once a day drug to help ensure compliance. He states that he had stopped all of his medicines last year do to headaches.  He is trying to figure out which medication was causing the headaches.  There was significant tortuosity in the right subclavian artery making torquing catheters difficult from the wrist.  Would plan groin access in the future, particularly for RCA intervention if needed.  He'll be watched in the CCU. Continue bivalirudin for 2 hours.  Cardiology followup will be with Dr. Radford Pax once he is discharged.

## 2013-09-06 NOTE — ED Notes (Signed)
[facility]- pt comes from home 9/10 CP for several days worsening today, took 3 nitro today, some pain relief, 9/10 CP when EMS arrived. Was A x 4. EKG showing elevation in V2-V4. Given 324 aspirin, 1 nitro. Upon transport, pt mention started to decrease became  Diaphoretic. BP initially 140/100. Upon arrival 84/62. Cardiology at bedside. Determining STEMI status. Reports hx of stent.

## 2013-09-07 DIAGNOSIS — E785 Hyperlipidemia, unspecified: Secondary | ICD-10-CM

## 2013-09-07 DIAGNOSIS — I2510 Atherosclerotic heart disease of native coronary artery without angina pectoris: Secondary | ICD-10-CM

## 2013-09-07 DIAGNOSIS — I219 Acute myocardial infarction, unspecified: Secondary | ICD-10-CM

## 2013-09-07 HISTORY — PX: CORONARY ANGIOGRAM: SHX5786

## 2013-09-07 LAB — GLUCOSE, CAPILLARY
Glucose-Capillary: 305 mg/dL — ABNORMAL HIGH (ref 70–99)
Glucose-Capillary: 247 mg/dL — ABNORMAL HIGH (ref 70–99)
Glucose-Capillary: 231 mg/dL — ABNORMAL HIGH (ref 70–99)
Glucose-Capillary: 254 mg/dL — ABNORMAL HIGH (ref 70–99)

## 2013-09-07 LAB — BASIC METABOLIC PANEL
ANION GAP: 15 (ref 5–15)
BUN: 17 mg/dL (ref 6–23)
CO2: 23 mEq/L (ref 19–32)
Calcium: 8.7 mg/dL (ref 8.4–10.5)
Chloride: 97 mEq/L (ref 96–112)
Creatinine, Ser: 1.15 mg/dL (ref 0.50–1.35)
GFR, EST AFRICAN AMERICAN: 79 mL/min — AB (ref >90)
GFR, EST NON AFRICAN AMERICAN: 68 mL/min — AB (ref >90)
GLUCOSE: 258 mg/dL — AB (ref 70–99)
POTASSIUM: 3.8 meq/L (ref 3.7–5.3)
SODIUM: 135 meq/L — AB (ref 137–147)

## 2013-09-07 LAB — CBC
HCT: 46.8 % (ref 39.0–52.0)
Hemoglobin: 15.6 g/dL (ref 13.0–17.0)
MCH: 25.8 pg — ABNORMAL LOW (ref 26.0–34.0)
MCHC: 33.3 g/dL (ref 30.0–36.0)
MCV: 77.5 fL — AB (ref 78.0–100.0)
PLATELETS: 204 10*3/uL (ref 150–400)
RBC: 6.04 MIL/uL — AB (ref 4.22–5.81)
RDW: 15.9 % — ABNORMAL HIGH (ref 11.5–15.5)
WBC: 14.5 10*3/uL — ABNORMAL HIGH (ref 4.0–10.5)

## 2013-09-07 LAB — TROPONIN I: Troponin I: >20 ng/mL (ref <0.30)

## 2013-09-07 SURGERY — Surgical Case
Anesthesia: *Unknown

## 2013-09-07 MED ORDER — HEPARIN (PORCINE) IN NACL 2-0.9 UNIT/ML-% IJ SOLN
INTRAMUSCULAR | Status: AC
  Filled 2013-09-07: qty 500

## 2013-09-07 MED ORDER — ONDANSETRON HCL 4 MG/2ML IJ SOLN: 4.0000 mg | INTRAMUSCULAR | Status: DC

## 2013-09-07 MED ORDER — MORPHINE SULFATE 2 MG/ML IJ SOLN
INTRAMUSCULAR | Status: AC
  Filled 2013-09-07: qty 1

## 2013-09-07 MED ORDER — ONDANSETRON HCL 4 MG/2ML IJ SOLN
4.0000 mg | INTRAMUSCULAR | Status: DC | PRN
  Administered 2013-09-07 (×2): 4 mg via INTRAVENOUS
  Filled 2013-09-07 (×2): qty 2

## 2013-09-07 MED ORDER — SODIUM CHLORIDE 0.9 % IJ SOLN
3.0000 mL | Freq: Two times a day (BID) | INTRAMUSCULAR | Status: DC
  Administered 2013-09-07: 18:00:00 via INTRAVENOUS
  Administered 2013-09-08 (×2): 3 mL via INTRAVENOUS

## 2013-09-07 MED ORDER — FENTANYL CITRATE 0.05 MG/ML IJ SOLN
INTRAMUSCULAR | Status: AC
  Filled 2013-09-07: qty 2

## 2013-09-07 MED ORDER — HEPARIN SODIUM (PORCINE) 1000 UNIT/ML IJ SOLN
INTRAMUSCULAR | Status: AC
  Filled 2013-09-07: qty 1

## 2013-09-07 MED ORDER — VERAPAMIL HCL 2.5 MG/ML IV SOLN
INTRAVENOUS | Status: AC
  Filled 2013-09-07: qty 2

## 2013-09-07 MED ORDER — SODIUM CHLORIDE 0.9 % IJ SOLN: 3.0000 mL | INTRAMUSCULAR | Status: DC | PRN

## 2013-09-07 MED ORDER — MIDAZOLAM HCL 2 MG/2ML IJ SOLN
INTRAMUSCULAR | Status: AC
  Filled 2013-09-07: qty 2

## 2013-09-07 MED ORDER — MORPHINE SULFATE 2 MG/ML IJ SOLN
2.0000 mg | Freq: Once | INTRAMUSCULAR | Status: AC
  Administered 2013-09-07: 2 mg via INTRAVENOUS

## 2013-09-07 MED ORDER — SODIUM CHLORIDE 0.9 % IV SOLN: 250.0000 mL | INTRAVENOUS | Status: DC | PRN

## 2013-09-07 MED ORDER — LIDOCAINE HCL (PF) 1 % IJ SOLN
INTRAMUSCULAR | Status: AC
  Filled 2013-09-07: qty 30

## 2013-09-07 MED ORDER — OXYCODONE-ACETAMINOPHEN 5-325 MG PO TABS
1.0000 | ORAL_TABLET | Freq: Four times a day (QID) | ORAL | Status: DC | PRN
  Administered 2013-09-07 – 2013-09-09 (×6): 1 via ORAL
  Filled 2013-09-07 (×6): qty 1

## 2013-09-07 MED ORDER — OXYCODONE HCL 5 MG PO TABS
5.0000 mg | ORAL_TABLET | Freq: Four times a day (QID) | ORAL | Status: DC | PRN
  Filled 2013-09-07: qty 1

## 2013-09-07 MED ORDER — FUROSEMIDE 10 MG/ML IJ SOLN
40.0000 mg | Freq: Once | INTRAMUSCULAR | Status: AC
Start: 2013-09-07 — End: 2013-09-07
  Administered 2013-09-07: 40 mg via INTRAVENOUS
  Filled 2013-09-07: qty 4

## 2013-09-07 NOTE — Progress Notes (Signed)
  Echocardiogram 2D Echocardiogram has been performed.  [name] 09/07/2013, 4:57 PM

## 2013-09-07 NOTE — Interval H&P Note (Signed)
History and Physical Interval Note:  09/07/2013 9:26 AM  [name]  has presented today for surgery, with the diagnosis of stemi  The various methods of treatment have been discussed with the patient and family. After consideration of risks, benefits and other options for treatment, the patient has consented to  Procedure(s) with comments: LEFT HEART CATHETERIZATION WITH CORONARY ANGIOGRAM (N/A) PERCUTANEOUS CORONARY STENT INTERVENTION (PCI-S) (N/A) - Mid LAD 3.0/24mm Promus as a surgical intervention .  The patient's history has been reviewed, patient examined, no change in status, stable for surgery.  I have reviewed the patient's chart and labs.  Questions were answered to the patient's satisfaction.    Cath Lab Visit (complete for each Cath Lab visit)  Clinical Evaluation Leading to the Procedure:   ACS: Yes.    Non-ACS:    Anginal Classification: CCS IV  Anti-ischemic medical therapy: Minimal Therapy (1 class of medications)  Non-Invasive Test Results: No non-invasive testing performed  Prior CABG: No previous CABG       Sherren Mocha

## 2013-09-07 NOTE — H&P (View-Only) (Signed)
SUBJECTIVE:  Complains of chest pain intermittent throughout the night and currently with 5/10 CP  OBJECTIVE:   Vitals:   Filed Vitals:   09/07/13 0500 09/07/13 0600 09/07/13 0700 09/07/13 0800  BP: 117/76 111/68 116/76 118/77  Pulse: 81 80 81 78  Temp:    97.8 F (36.6 C)  TempSrc:    Oral  Resp: 23 23 24 24   Height:      Weight:      SpO2: 97% 97% 96% 100%   I&O's:   Intake/Output Summary (Last 24 hours) at 09/07/13 1751 Last data filed at 09/07/13 0400  Gross per 24 hour  Intake 657.81 ml  Output   1400 ml  Net -742.19 ml   TELEMETRY: Reviewed telemetry pt in NSR:     PHYSICAL EXAM General: Well developed, well nourished, in no acute distress Head: Eyes PERRLA, No xanthomas.   Normal cephalic and atramatic  Lungs:   Clear bilaterally to auscultation and percussion. Heart:   HRRR S1 S2 Pulses are 2+ & equal.            No carotid bruit. No JVD.  No abdominal bruits. No femoral bruits. Abdomen: Bowel sounds are positive, abdomen soft and non-tender without masses  Extremities:   No clubbing, cyanosis or edema.  DP +1 Neuro: Alert and oriented X 3. Psych:  Good affect, responds appropriately   LABS: Basic Metabolic Panel:  Recent Labs  09/06/13 1505 09/07/13 0305  NA 136*  137 135*  K 4.7  4.7 3.8  CL 95*  100 97  CO2 21 23  GLUCOSE 384*  399* 258*  BUN 18  19 17   CREATININE 1.38*  1.50* 1.15  CALCIUM 9.5 8.7   Liver Function Tests:  Recent Labs  09/06/13 1505  AST 101*  ALT 28  ALKPHOS 106  BILITOT 0.3  PROT 8.3  ALBUMIN 3.7   No results found for this basename: LIPASE, AMYLASE,  in the last 72 hours CBC:  Recent Labs  09/06/13 1505 09/07/13 0305  WBC 13.4* 14.5*  HGB 16.1  18.0* 15.6  HCT 46.7  53.0* 46.8  MCV 76.8* 77.5*  PLT 246 204   Cardiac Enzymes:  Recent Labs  09/06/13 1505  CKTOTAL 1633*  CKMB 143.5*  TROPONINI 14.47*   BNP: No components found with this basename: POCBNP,  D-Dimer: No results found for  this basename: DDIMER,  in the last 72 hours Hemoglobin A1C: No results found for this basename: HGBA1C,  in the last 72 hours Fasting Lipid Panel: No results found for this basename: CHOL, HDL, LDLCALC, TRIG, CHOLHDL, LDLDIRECT,  in the last 72 hours Thyroid Function Tests: No results found for this basename: TSH, T4TOTAL, FREET3, T3FREE, THYROIDAB,  in the last 72 hours Anemia Panel: No results found for this basename: VITAMINB12, FOLATE, FERRITIN, TIBC, IRON, RETICCTPCT,  in the last 72 hours Coag Panel:   Lab Results  Component Value Date   INR 1.04 09/06/2013    RADIOLOGY: No results found.   ASSESSMENT:  1. Subacute anterolateral STEM with ongoing CP s/p cath showing late stent thrombosis of the mid LAD s/p aspiration thrombectomy and DES stent with residual mild disease of the left circ and large OM1 and severe disease in the OM2.  Moderate 60% mid RCA disease and severe disease of the distal RCA up to 90% at the crux of the RCA with moderate disease extending into the PDA.  He continues to have intermittent chest pain that has been present  since his cath but throughout the night had more pain and currently pain is 5/10.  No troponins done except initial one.  His last EKG post cath at 20:31pm showed 1-1.107mmof ST elevation in V1 and V2 and EKG now shows 68mm of ST elevation in V1 and V2 with new ST elevation in V3 and V4.   2. Cardiogenic shock - resolved 3. CAD s/p LAD PCI in 6/14  4. Tobacco use  5. HTN - controlled 6. HL   PLAN:   1.  I have discussed case with Dr. Burt Knack who is on call for interventional cardiology.  Patient continues to have CP now with new ST elevations in V3 and V4.  Will proceed with cardiac cath to assess patency of his LAD stent.  He does not tolerate NTG due to severe HA so will treat pain with morphine. 2.  Continue ASA and Effient 3.  Add low dose BB if BP remains stable today off pressors 4.  Add ACE I if BP remains stable on BB 5.  Metformin on hold  due to cath and contrast exposure 6.  Check 2D echo to assess LVF 7.  Add Lipitor 80mg  8.  Check FLP  [name], MD  09/07/2013  8:21 AM

## 2013-09-07 NOTE — CV Procedure (Signed)
    Cardiac Catheterization Procedure Note  Name: [name] MRN: [id number] DOB: 13-Aug-1955  Procedure: Left Heart Cath, Selective Coronary Angiography, LV angiography  Indication: Post-MI Chest pain, ongoing with residual ST elevation.   Procedural Details: The left wrist was prepped, draped, and anesthetized with 1% lidocaine. Using the modified Seldinger technique, a 5/6 French Slender sheath was introduced into the left radial artery. 3 mg of verapamil was administered through the sheath, weight-based unfractionated heparin was administered intravenously. Standard Judkins catheters were used for selective coronary angiography and left ventriculography. Catheter exchanges were performed over an exchange length guidewire. There were no immediate procedural complications. A TR band was used for radial hemostasis at the completion of the procedure.  The patient was transferred to the post catheterization recovery area for further monitoring.  Procedural Findings: Hemodynamics: AO 113/69 LV 112/27  Coronary angiography: Coronary dominance: right  Left mainstem: The left mainstem is patent. The vessel trifurcates into the LAD, ramus intermedius, and left circumflex the  Left anterior descending (LAD): The LAD has 30-40% ostial stenosis. The stented segment is widely patent throughout. There is no evidence of thrombus or stenosis within recently placed stent. The diagonals are small. The second diagonal branch is jailed at the ostium with 90% stenosis. This is a very small vessel not suitable for PCI. The mid and distal LAD are diffusely diseased without significant stenosis.  Left circumflex (LCx): The left circumflex is patent. The ramus intermedius is patent with mild irregularity. The AV circumflex is patent until the distal AV groove. The first obtuse marginal branch is severely diseased in a diffuse fashion. There are tandem 80-90% lesions present.  Right coronary artery (RCA): This  is a dominant vessel. The vessel is diffusely diseased. Proximally there are irregularities noted. The mid vessel has diffuse 60-70% stenosis. The distal vessel is severely diseased leading into the PDA branch. There is 80-90% stenosis. The mid to distal PDA is also disease with 70-80% stenosis. The PLA branch is patent.  Left ventriculography: The basal segments of the left ventricle contract normally. The apex is not visualized. I am unable to estimate the LVEF.  Radiation dose/Fluoro time: 3.5 minutes  Estimated Blood Loss: Minimal  Final Conclusions:   1. Continued LAD stent patency with TIMI-3 flow. 2. Diffuse diabetic coronary artery disease with and distal vessel disease pattern involving the distal RCA, PDA, diagonal branches, and obtuse marginal branch. 3. Elevated LVEDP, unable to quantitate LV function but normal contraction of the basal segments of the left ventricle  Recommendations: Continue medical management. 2-D echocardiogram for evaluation of LV systolic function.  Sherren Mocha MD, [facility] 09/07/2013, 10:06 AM

## 2013-09-07 NOTE — Progress Notes (Addendum)
1528- Per patient's nurse, patient's BP dropped getting him up to restroom and still having some CP. Advised no walk at this time. Will f/u Monday for ambulation and education.  Sol Passer, MS, ACSM CCEP

## 2013-09-07 NOTE — Progress Notes (Signed)
EKG CRITICAL VALUE     12 lead EKG performed.  Critical value noted. Martin[place] Brady, RN notified.   Tamira Ryland C, CCT 09/07/2013 8:38 AM

## 2013-09-07 NOTE — Progress Notes (Signed)
SUBJECTIVE:  Complains of chest pain intermittent throughout the night and currently with 5/10 CP  OBJECTIVE:   Vitals:   Filed Vitals:   09/07/13 0500 09/07/13 0600 09/07/13 0700 09/07/13 0800  BP: 117/76 111/68 116/76 118/77  Pulse: 81 80 81 78  Temp:    97.8 F (36.6 C)  TempSrc:    Oral  Resp: 23 23 24 24   Height:      Weight:      SpO2: 97% 97% 96% 100%   I&O's:   Intake/Output Summary (Last 24 hours) at 09/07/13 8119 Last data filed at 09/07/13 0400  Gross per 24 hour  Intake 657.81 ml  Output   1400 ml  Net -742.19 ml   TELEMETRY: Reviewed telemetry pt in NSR:     PHYSICAL EXAM General: Well developed, well nourished, in no acute distress Head: Eyes PERRLA, No xanthomas.   Normal cephalic and atramatic  Lungs:   Clear bilaterally to auscultation and percussion. Heart:   HRRR S1 S2 Pulses are 2+ & equal.            No carotid bruit. No JVD.  No abdominal bruits. No femoral bruits. Abdomen: Bowel sounds are positive, abdomen soft and non-tender without masses  Extremities:   No clubbing, cyanosis or edema.  DP +1 Neuro: Alert and oriented X 3. Psych:  Good affect, responds appropriately   LABS: Basic Metabolic Panel:  Recent Labs  09/06/13 1505 09/07/13 0305  NA 136*  137 135*  K 4.7  4.7 3.8  CL 95*  100 97  CO2 21 23  GLUCOSE 384*  399* 258*  BUN 18  19 17   CREATININE 1.38*  1.50* 1.15  CALCIUM 9.5 8.7   Liver Function Tests:  Recent Labs  09/06/13 1505  AST 101*  ALT 28  ALKPHOS 106  BILITOT 0.3  PROT 8.3  ALBUMIN 3.7   No results found for this basename: LIPASE, AMYLASE,  in the last 72 hours CBC:  Recent Labs  09/06/13 1505 09/07/13 0305  WBC 13.4* 14.5*  HGB 16.1  18.0* 15.6  HCT 46.7  53.0* 46.8  MCV 76.8* 77.5*  PLT 246 204   Cardiac Enzymes:  Recent Labs  09/06/13 1505  CKTOTAL 1633*  CKMB 143.5*  TROPONINI 14.47*   BNP: No components found with this basename: POCBNP,  D-Dimer: No results found for  this basename: DDIMER,  in the last 72 hours Hemoglobin A1C: No results found for this basename: HGBA1C,  in the last 72 hours Fasting Lipid Panel: No results found for this basename: CHOL, HDL, LDLCALC, TRIG, CHOLHDL, LDLDIRECT,  in the last 72 hours Thyroid Function Tests: No results found for this basename: TSH, T4TOTAL, FREET3, T3FREE, THYROIDAB,  in the last 72 hours Anemia Panel: No results found for this basename: VITAMINB12, FOLATE, FERRITIN, TIBC, IRON, RETICCTPCT,  in the last 72 hours Coag Panel:   Lab Results  Component Value Date   INR 1.04 09/06/2013    RADIOLOGY: No results found.   ASSESSMENT:  1. Subacute anterolateral STEM with ongoing CP s/p cath showing late stent thrombosis of the mid LAD s/p aspiration thrombectomy and DES stent with residual mild disease of the left circ and large OM1 and severe disease in the OM2.  Moderate 60% mid RCA disease and severe disease of the distal RCA up to 90% at the crux of the RCA with moderate disease extending into the PDA.  He continues to have intermittent chest pain that has been present  since his cath but throughout the night had more pain and currently pain is 5/10.  No troponins done except initial one.  His last EKG post cath at 20:31pm showed 1-1.52mmof ST elevation in V1 and V2 and EKG now shows 61mm of ST elevation in V1 and V2 with new ST elevation in V3 and V4.   2. Cardiogenic shock - resolved 3. CAD s/p LAD PCI in 6/14  4. Tobacco use  5. HTN - controlled 6. HL   PLAN:   1.  I have discussed case with Dr. Burt Knack who is on call for interventional cardiology.  Patient continues to have CP now with new ST elevations in V3 and V4.  Will proceed with cardiac cath to assess patency of his LAD stent.  He does not tolerate NTG due to severe HA so will treat pain with morphine. 2.  Continue ASA and Effient 3.  Add low dose BB if BP remains stable today off pressors 4.  Add ACE I if BP remains stable on BB 5.  Metformin on hold  due to cath and contrast exposure 6.  Check 2D echo to assess LVF 7.  Add Lipitor 80mg  8.  Check FLP  [name], MD  09/07/2013  8:21 AM

## 2013-09-07 NOTE — Progress Notes (Signed)
Pt stated to have continuing chest pain overnight after cath. Pain is the same as it was when first admitted. EKG done with STEMI. Dr. Radford Pax in unit and was called to bedside. Pt going for cath with Dr. Burt Knack. Will continue to monitor.

## 2013-09-08 ENCOUNTER — Encounter (HOSPITAL_COMMUNITY): Payer: Self-pay | Admitting: Certified Registered Nurse Anesthetist

## 2013-09-08 LAB — GLUCOSE, CAPILLARY
GLUCOSE-CAPILLARY: 201 mg/dL — AB (ref 70–99)
Glucose-Capillary: 283 mg/dL — ABNORMAL HIGH (ref 70–99)
Glucose-Capillary: 291 mg/dL — ABNORMAL HIGH (ref 70–99)
Glucose-Capillary: 178 mg/dL — ABNORMAL HIGH (ref 70–99)

## 2013-09-08 LAB — TROPONIN I
Troponin I: 19.13 ng/mL (ref <0.30)
Troponin I: 16.07 ng/mL (ref <0.30)
Troponin I: >20 ng/mL (ref <0.30)

## 2013-09-08 MED ORDER — FUROSEMIDE 20 MG PO TABS
20.0000 mg | ORAL_TABLET | Freq: Every day | ORAL | Status: DC
  Administered 2013-09-08 – 2013-09-09 (×2): 20 mg via ORAL
  Filled 2013-09-08 (×2): qty 1

## 2013-09-08 MED ORDER — ATORVASTATIN CALCIUM 80 MG PO TABS
80.0000 mg | ORAL_TABLET | Freq: Every day | ORAL | Status: DC
  Administered 2013-09-08: 80 mg via ORAL
  Filled 2013-09-08 (×2): qty 1

## 2013-09-08 MED ORDER — ISOSORBIDE MONONITRATE ER 30 MG PO TB24
30.0000 mg | ORAL_TABLET | Freq: Every day | ORAL | Status: DC
Start: 2013-09-08 — End: 2013-09-09
  Administered 2013-09-08 – 2013-09-09 (×2): 30 mg via ORAL
  Filled 2013-09-08 (×2): qty 1

## 2013-09-08 MED ORDER — DOCUSATE SODIUM 100 MG PO CAPS
100.0000 mg | ORAL_CAPSULE | Freq: Every day | ORAL | Status: DC
  Administered 2013-09-08 – 2013-09-09 (×2): 100 mg via ORAL
  Filled 2013-09-08 (×2): qty 1

## 2013-09-08 MED ORDER — METOPROLOL TARTRATE 12.5 MG HALF TABLET
12.5000 mg | ORAL_TABLET | Freq: Two times a day (BID) | ORAL | Status: DC
  Administered 2013-09-08 – 2013-09-09 (×3): 12.5 mg via ORAL
  Filled 2013-09-08 (×4): qty 1

## 2013-09-08 NOTE — Progress Notes (Signed)
SUBJECTIVE:  No further CP or SOB  OBJECTIVE:   Vitals:   Filed Vitals:   09/08/13 0500 09/08/13 0600 09/08/13 0700 09/08/13 0800  BP: 108/53 119/83 104/73 115/72  Pulse: 87 85 82 81  Temp:   98.4 F (36.9 C)   TempSrc:   Oral   Resp:      Height:      Weight:      SpO2: 91% 93% 95% 93%   I&O's:   Intake/Output Summary (Last 24 hours) at 09/08/13 0949 Last data filed at 09/08/13 0900  Gross per 24 hour  Intake   1580 ml  Output   1500 ml  Net     80 ml   TELEMETRY: Reviewed telemetry pt in NSR:     PHYSICAL EXAM General: Well developed, well nourished, in no acute distress Head: Eyes PERRLA, No xanthomas.   Normal cephalic and atramatic  Lungs:   Clear bilaterally to auscultation and percussion. Heart:   HRRR S1 S2 Pulses are 2+ & equal. Abdomen: Bowel sounds are positive, abdomen soft and non-tender without masses  Extremities:   No clubbing, cyanosis or edema.  DP +1 Neuro: Alert and oriented X 3. Psych:  Good affect, responds appropriately   LABS: Basic Metabolic Panel:  Recent Labs  09/06/13 1505 09/07/13 0305  NA 136*  137 135*  K 4.7  4.7 3.8  CL 95*  100 97  CO2 21 23  GLUCOSE 384*  399* 258*  BUN 18  19 17   CREATININE 1.38*  1.50* 1.15  CALCIUM 9.5 8.7   Liver Function Tests:  Recent Labs  09/06/13 1505  AST 101*  ALT 28  ALKPHOS 106  BILITOT 0.3  PROT 8.3  ALBUMIN 3.7   No results found for this basename: LIPASE, AMYLASE,  in the last 72 hours CBC:  Recent Labs  09/06/13 1505 09/07/13 0305  WBC 13.4* 14.5*  HGB 16.1  18.0* 15.6  HCT 46.7  53.0* 46.8  MCV 76.8* 77.5*  PLT 246 204   Cardiac Enzymes:  Recent Labs  09/06/13 1505 09/07/13 0830  CKTOTAL 1633*  --   CKMB 143.5*  --   TROPONINI 14.47* >20.00*   BNP: No components found with this basename: POCBNP,  D-Dimer: No results found for this basename: DDIMER,  in the last 72 hours Hemoglobin A1C: No results found for this basename: HGBA1C,  in the last  72 hours Fasting Lipid Panel: No results found for this basename: CHOL, HDL, LDLCALC, TRIG, CHOLHDL, LDLDIRECT,  in the last 72 hours Thyroid Function Tests: No results found for this basename: TSH, T4TOTAL, FREET3, T3FREE, THYROIDAB,  in the last 72 hours Anemia Panel: No results found for this basename: VITAMINB12, FOLATE, FERRITIN, TIBC, IRON, RETICCTPCT,  in the last 72 hours Coag Panel:   Lab Results  Component Value Date   INR 1.04 09/06/2013    RADIOLOGY: No results found.  ASSESSMENT:  1. Subacute anterolateral STEM with ongoing CP s/p cath showing late stent thrombosis of the mid LAD s/p aspiration thrombectomy and DES stent with residual mild disease of the left circ and large OM1 and severe disease in the OM2. Moderate 60% mid RCA disease and severe disease of the distal RCA up to 90% at the crux of the RCA with moderate disease extending into the PDA. S/P repeat cath yesterday due to ongoing CP with new ST elevation in V3 and V4 showing patent LAD stent and diffuse severe diabetic distal disease of RCA/idag/PDA and OM.  LVEDP was elevated at 83mmHg.   2. Cardiogenic shock - resolved  3. CAD s/p LAD PCI in 6/14  4. Tobacco use  5. HTN - controlled  6. HL   PLAN:  1. Add lopressor 12.5mg  BID 2. Continue ASA/Effient/statin  3. Change NTG paste to Imdur 30mg  daily 4. Consider adding on ACE I at some point if BP remains stable on BB  5. Metformin on hold due to cath and contrast exposure  6. Check 2D echo to assess LVF  7. Leukocytosis most likely related to recent MI.  He is not febrile.  Recheck in am 8. Check FLP in am 9. Add Lasix 20mg  daily due to elevated LVEDP  Stable to transfer to tele bed with probable d/c home in am  Russell Margarita, MD  09/08/2013  9:49 AM

## 2013-09-09 ENCOUNTER — Encounter (HOSPITAL_COMMUNITY): Payer: Self-pay | Admitting: Cardiology

## 2013-09-09 ENCOUNTER — Inpatient Hospital Stay (HOSPITAL_COMMUNITY): Payer: Medicare Other

## 2013-09-09 DIAGNOSIS — I2510 Atherosclerotic heart disease of native coronary artery without angina pectoris: Secondary | ICD-10-CM | POA: Diagnosis present

## 2013-09-09 DIAGNOSIS — E1149 Type 2 diabetes mellitus with other diabetic neurological complication: Secondary | ICD-10-CM | POA: Diagnosis present

## 2013-09-09 DIAGNOSIS — E1122 Type 2 diabetes mellitus with diabetic chronic kidney disease: Secondary | ICD-10-CM | POA: Diagnosis present

## 2013-09-09 DIAGNOSIS — N183 Chronic kidney disease, stage 3 unspecified: Secondary | ICD-10-CM | POA: Diagnosis present

## 2013-09-09 DIAGNOSIS — I255 Ischemic cardiomyopathy: Secondary | ICD-10-CM | POA: Diagnosis present

## 2013-09-09 LAB — BASIC METABOLIC PANEL
Anion gap: 13 (ref 5–15)
BUN: 26 mg/dL — ABNORMAL HIGH (ref 6–23)
CO2: 24 meq/L (ref 19–32)
CREATININE: 1.43 mg/dL — AB (ref 0.50–1.35)
Calcium: 8.6 mg/dL (ref 8.4–10.5)
Chloride: 99 mEq/L (ref 96–112)
GFR calc non Af Amer: 52 mL/min — ABNORMAL LOW (ref >90)
GFR, EST AFRICAN AMERICAN: 60 mL/min — AB (ref >90)
Glucose, Bld: 242 mg/dL — ABNORMAL HIGH (ref 70–99)
Potassium: 4.1 mEq/L (ref 3.7–5.3)
Sodium: 136 mEq/L — ABNORMAL LOW (ref 137–147)

## 2013-09-09 LAB — LIPID PANEL
Cholesterol: 216 mg/dL — ABNORMAL HIGH (ref 0–200)
HDL: 27 mg/dL — ABNORMAL LOW (ref >39)
LDL Cholesterol: 143 mg/dL — ABNORMAL HIGH (ref 0–99)
Total CHOL/HDL Ratio: 8 RATIO
Triglycerides: 229 mg/dL — ABNORMAL HIGH (ref <150)
VLDL: 46 mg/dL — ABNORMAL HIGH (ref 0–40)

## 2013-09-09 LAB — GLUCOSE, CAPILLARY: Glucose-Capillary: 221 mg/dL — ABNORMAL HIGH (ref 70–99)

## 2013-09-09 LAB — TROPONIN I: Troponin I: 12.73 ng/mL (ref <0.30)

## 2013-09-09 MED ORDER — ACETAMINOPHEN 325 MG PO TABS: 650.0000 mg | ORAL_TABLET | ORAL | Status: DC | PRN

## 2013-09-09 MED ORDER — METOPROLOL TARTRATE 12.5 MG HALF TABLET: 12.5000 mg | ORAL_TABLET | Freq: Two times a day (BID) | ORAL | Status: DC

## 2013-09-09 MED ORDER — NICOTINE 7 MG/24HR TD PT24: 7.0000 mg | MEDICATED_PATCH | Freq: Every day | TRANSDERMAL | Status: DC

## 2013-09-09 MED ORDER — ATORVASTATIN CALCIUM 80 MG PO TABS: 80.0000 mg | ORAL_TABLET | Freq: Every day | ORAL | Status: DC

## 2013-09-09 MED ORDER — PRASUGREL HCL 10 MG PO TABS: 10.0000 mg | ORAL_TABLET | Freq: Every day | ORAL | Status: DC

## 2013-09-09 MED ORDER — ISOSORBIDE MONONITRATE ER 30 MG PO TB24: 30.0000 mg | ORAL_TABLET | Freq: Every day | ORAL | Status: DC

## 2013-09-09 MED ORDER — FUROSEMIDE 20 MG PO TABS: 20.0000 mg | ORAL_TABLET | Freq: Every day | ORAL | Status: DC

## 2013-09-09 MED ORDER — METFORMIN HCL 1000 MG PO TABS: 1000.0000 mg | ORAL_TABLET | Freq: Two times a day (BID) | ORAL | Status: DC

## 2013-09-09 MED FILL — Sodium Chloride IV Soln 0.9%: INTRAVENOUS | Qty: 50 | Status: AC

## 2013-09-09 NOTE — Care Management Note (Signed)
    Page 1 of 1   09/09/2013     1:42:00 PM CARE MANAGEMENT NOTE 09/09/2013  Patient:  [name],[name]   Account Number:  [id number]  Date Initiated:  09/09/2013  Documentation initiated by:  GRAVES-BIGELOW,Amanee Iacovelli  Subjective/Objective Assessment:   Pt admitted for stemi. Plan for d/c today on effient.     Action/Plan:   30 day free card given to pt and he is to f/u with the [facility] in [place] with MD Clance Boll. CM did make pt aware that most of his medicaitons will be on the $4.00 med list at [place].   Anticipated DC Date:  09/09/2013   Anticipated DC Plan:  [place]  CM consult  Medication Assistance      Choice offered to / List presented to:             Status of service:  Completed, signed off Medicare Important Message given?  NO (If response is "NO", the following Medicare IM given date fields will be blank) Date Medicare IM given:   Medicare IM given by:   Date Additional Medicare IM given:   Additional Medicare IM given by:    Discharge Disposition:  HOME/SELF CARE  Per UR Regulation:  Reviewed for med. necessity/level of care/duration of stay  If discussed at [place] of Stay Meetings, dates discussed:    Comments:

## 2013-09-09 NOTE — Discharge Instructions (Signed)
Myocardial Infarction °A myocardial infarction (MI) is damage to the heart that is not reversible. It is also called a heart attack. An MI usually occurs when a heart (coronary) artery becomes blocked or narrowed. This cuts off the blood supply to the heart. When one or more of the heart (coronary) arteries becomes blocked, that area of the heart begins to die. This causes pain felt during an MI.  °If you think you might be having an MI, call your local emergency services immediately (911 in [place] It is recommended that you chew and swallow 3 non-enteric coated baby aspirin if you do not have an aspirin allergy. Do not drive yourself to the hospital or wait to see if your symptoms go away. The sooner MI is treated, the greater the amount of heart muscle saved. Time is muscle. It can save your life. °CAUSES  °An MI can occur from: °· A gradual buildup of a fatty substance called plaque. When plaque builds up in the arteries, this condition is called atherosclerosis. This buildup can block or reduce the blood supply to the heart artery(s). °· A sudden plaque rupture within a heart artery that causes a blood clot (thrombus). A blood clot can block the heart artery which does not allow blood flow to the heart. °· A severe tightening (spasm) of the heart artery. This is a less common cause of a heart attack. When a heart artery spasms, it cuts off blood flow through the artery. Spasms can occur in heart arteries that do not have atherosclerosis. °RISK FACTORS °People at risk for an MI usually have one or more risk factors, such as: °· High blood pressure. °· High cholesterol. °· Smoking. °· Gender. Men have a higher heart attack risk. °· Overweight/obesity. °· Age. °· Family history. °· Lack of exercise. °· Diabetes. °· Stress. °· Excessive alcohol use. °· Street drug use (cocaine and methamphetamines). °SYMPTOMS  °MI symptoms can vary, such as: °· In both men and women, MI symptoms can include the following: °· Chest  pain. The chest pain may feel like a crushing, squeezing, or "pressure" type feeling. MI pain can be "referred," meaning pain can be caused in one part of the body but felt in another part of the body. Referred MI pain may occur in the left arm, neck, or jaw. Pain may even be felt in the right arm. °· Shortness of breath (dyspnea). °· Heartburn or indigestion with or without vomiting, shortness of breath, or sweating (diaphoresis). °· Sudden, cold sweats. °· Sudden lightheadedness. °· Upper back pain. °· Women can have unique MI symptoms, such as: °· Unexplained feelings of nervousness or anxiety. °· Discomfort between the shoulder blades (scapula) or upper back. °· Tingling in the hands and arms. °· In elderly people (regardless of gender), MI symptoms can be subtle, such as: °· Sweating (diaphoresis). °· Shortness of breath (dyspnea). °· General tiredness (fatigue) or not feeling well (malaise). °DIAGNOSIS  °Diagnosis of an MI involves several tests such as: °· An assessment of your vital signs such as heart rhythm, blood pressure, respiratory rate, and oxygen level. °· An EKG (ECG) to look at the electrical activity of your heart. °· Blood tests called cardiac markers are drawn at scheduled times to measure proteins or enzymes released by the damaged heart muscle. °· A chest X-ray. °· An echocardiogram to evaluate heart motion and blood flow. °· Coronary angiography (cardiac catheterization). This is a diagnostic procedure to look at the heart arteries. °TREATMENT  °Acute Intervention. For   an MI, the national standard in the [place] is to have an acute intervention in under 90 minutes from the time you get to the hospital. An acute intervention is a special procedure to open up the heart arteries. It is done in a treatment room called a "catheterization lab" (cath lab). Some hospitals do no have a cath lab. If you are having an MI and the hospital does not have a cath lab, the standard is to transport you  to a hospital that has one. In the cath lab, acute intervention includes:  Angioplasty. An angioplasty involves inserting a thin, flexible tube (catheter) into an artery in either your groin or wrist. The catheter is threaded to the heart arteries. A balloon at the end of the catheter is inflated to open a narrowed or blocked heart artery. During an angioplasty procedure, a small mesh tube (stent) may be used to keep the heart artery open. Depending on your condition and health history, one of two types of stents may be placed:  Drug-eluting stent (DES). A DES is coated with a medicine to prevent scar tissue from growing over the stent. With drug-eluting stents, blood thinning medicine will need to be taken for up to a year.  Bare metal stent. This type of stent has no special coating to keep tissue from growing over it. This type of stent is used if you cannot take blood thinning medicine for a prolonged time or you need surgery in the near future. After a bare metal stent is placed, blood thinning medicine will need to be taken for about a month.  If you are taking blood thinning medicine (anti-platelet therapy) after stent placement, do not stop taking it unless your caregiver says it is okay to do so. Make sure you understand how long you need to take the medicine. Surgical Intervention  If an acute intervention is not successful, surgery may be needed:  Open heart surgery (coronary artery bypass graft, CABG). CABG takes a vein (saphenous vein) from your leg. The vein is then attached to the blocked heart artery which bypasses the blockage. This then allows blood flow to the heart muscle. Additional Interventions  A "clot buster" medicine (thrombolytic) may be given. This medicine can help break up a clot in the heart artery. This medicine may be given if a person cannot get to a cath lab right away.  Intra-aortic balloon pump (IABP). If you have suffered a very severe MI and are too unstable  to go to the cath lab or to surgery, an IABP may be used. This is a temporary mechanical device used to increase blood flow to the heart and reduce the workload of the heart until you are stable enough to go to the cath lab or surgery. HOME CARE INSTRUCTIONS After an MI, you may need the following:  Medicine. Take medicine as directed by your caregiver. Medicines after an MI may:  Keep your blood from clotting easily (blood thinners).  Control your blood pressure.  Help lower your cholesterol.  Control abnormal heart rhythms.  Lifestyle changes. Under the guidance of your caregiver, lifestyle changes include:  Quitting smoking, if you smoke. Your caregiver can help you quit.  Being physically active.  Maintaining a healthy weight.  Eating a heart healthy diet. A dietitian can help you learn healthy eating options.  Managing diabetes.  Reducing stress.  Limiting alcohol intake. SEEK IMMEDIATE MEDICAL CARE IF:   You have severe chest pain, especially if the pain is crushing  or pressure-like and spreads to the arms, back, neck, or jaw. This is an emergency. Do not wait to see if the pain will go away. Get medical help at once. Call your local emergency services (911 in the [place] Do not drive yourself to the hospital.  You have shortness of breath during rest, sleep, or with activity.  You have sudden sweating or clammy skin.  You feel sick to your stomach (nauseous) and throw up (vomit).  You suddenly become lightheaded or dizzy.  You feel your heart beating rapidly or you notice "skipped" beats. MAKE SURE YOU:   Understand these instructions.  Will watch your condition.  Will get help right away if you are not doing well or get worse. Document Released: 12/27/2004 Document Revised: 01/01/2013 Document Reviewed: 03/01/2013 [facility] Patient Information 2015 [place], [place]. This information is not intended to replace advice given to you by your health care provider.  Make sure you discuss any questions you have with your health care provider.  HAVE LAB DRAWN ON 09/11/13-   Radial Site Care Refer to this sheet in the next few weeks. These instructions provide you with information on caring for yourself after your procedure. Your caregiver may also give you more specific instructions. Your treatment has been planned according to current medical practices, but problems sometimes occur. Call your caregiver if you have any problems or questions after your procedure. HOME CARE INSTRUCTIONS You may shower the day after the procedure.Remove the bandage (dressing) and gently wash the site with plain soap and water.Gently pat the site dry. Do not apply powder or lotion to the site. Do not submerge the affected site in water for 3 to 5 days. Inspect the site at least twice daily. Do not flex or bend the affected arm for 24 hours. No lifting over 5 pounds (2.3 kg) for 5 days after your procedure. Do not drive home if you are discharged the same day of the procedure. Have someone else drive you. You may drive 24 hours after the procedure unless otherwise instructed by your caregiver. Do not operate machinery or power tools for 24 hours. A responsible adult should be with you for the first 24 hours after you arrive home. What to expect: Any bruising will usually fade within 1 to 2 weeks. Blood that collects in the tissue (hematoma) may be painful to the touch. It should usually decrease in size and tenderness within 1 to 2 weeks. SEEK IMMEDIATE MEDICAL CARE IF: You have unusual pain at the radial site. You have redness, warmth, swelling, or pain at the radial site. You have drainage (other than a small amount of blood on the dressing). You have chills. You have a fever or persistent symptoms for more than 72 hours. You have a fever and your symptoms suddenly get worse. Your arm becomes pale, cool, tingly, or numb. You have heavy bleeding from the site. Hold  pressure on the site. Document Released: 01/29/2010 Document Revised: 03/21/2011 Document Reviewed: 01/29/2010 [facility] Patient Information 2015 [place], [place]. This information is not intended to replace advice given to you by your health care provider. Make sure you discuss any questions you have with your health care provider.

## 2013-09-09 NOTE — Progress Notes (Signed)
Subjective:  No chest pain overnight, up without problems  Objective:  Vital Signs in the last 24 hours: Temp:  [97.8 F (36.6 C)-98 F (36.7 C)] 97.8 F (36.6 C) (08/31 0500) Pulse Rate:  [82-84] 82 (08/31 0500) Resp:  [16] 16 (08/31 0500) BP: (96-118)/(55-74) 99/66 mmHg (08/31 0500) SpO2:  [94 %-97 %] 94 % (08/31 0500)  Intake/Output from previous day:  Intake/Output Summary (Last 24 hours) at 09/09/13 0814 Last data filed at 09/08/13 2100  Gross per 24 hour  Intake    543 ml  Output      0 ml  Net    543 ml    Physical Exam: General appearance: alert, cooperative, no distress and moderately obese Lungs: clear to auscultation bilaterally Heart: regular rate and rhythm Extremities: no hematoma Rt or Lt radial sites   Rate: 78  Rhythm: normal sinus rhythm  Lab Results:  Recent Labs  09/06/13 1505 09/07/13 0305  WBC 13.4* 14.5*  HGB 16.1  18.0* 15.6  PLT 246 204    Recent Labs  09/07/13 0305 09/09/13 0447  NA 135* 136*  K 3.8 4.1  CL 97 99  CO2 23 24  GLUCOSE 258* 242*  BUN 17 26*  CREATININE 1.15 1.43*    Recent Labs  09/08/13 1610 09/08/13 2154  TROPONINI 16.07* >20.00*    Recent Labs  09/06/13 1505  INR 1.04    Imaging: No CXR done  Cardiac Studies:  Assessment/Plan:  58 y/o male, [place] pt, with DM2, HTN, HL,smoker and CAD- s/p LAD DES June 2014, admitted through ER 09/06/13 with anterolateral STEMI and shock. He says he had stopped all his medications as an OP secondary to headaches. He was started on Levophed and taken urgently to the cath lab by Dr Irish Lack. He had thrombosis of the mid LAD stent and underwent thrombectomy and DES placement. EF was 40-45% by echo. He had recurrent chest pain with EKG changes and went back to the cath lab 09/07/13. The LAD stent was patent and he had residual  diffuse, diabetic CAD in the distal RCA and OM- plan is medical Rx.     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 Check EKG. No room to titrate meds as his B/P is soft. Check CXR before discharge (smoker). [facility] [place] I discussed diet and smoking cessation. Possible discharge later pending Lab, EKG. He will need to stop NSAID with renal insufficiency.   Kerin Ransom PA-C Beeper [phone] 09/09/2013, 8:14 AM   As above, patient seen and examined. He denies chest pain. Continue aspirin, effient, statin and low-dose beta-blockade. Continue nitrates. His blood pressure is borderline and therefore we cannot add an ACE inhibitor at this point. This can be considered as an outpatient pending followup blood pressures and renal function. Mild increasing creatinine this a.m. Would repeat BMET on September 2 to make sure that renal function stable following his 2 procedures and addition of low dose lasix. He needs close followup as  an outpatient. He would prefer to followup at the [place]. Patient instructed on importance of compliance with medications. Needs fu of microcytosis with his primary care physician. > 30 min PA and physician time D2 Kirk Ruths

## 2013-09-09 NOTE — Discharge Summary (Signed)
Patient ID: [name],  MRN: [id number], DOB/AGE: 1955-05-12 58 y.o.  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 He has another [facility] chart from June but this is unavailable. He says he had stopped all his medications as an OP secondary to headaches. He was started on Levophed in the ER and taken urgently to the cath lab by Dr Irish Lack. He had thrombosis of the mid LAD stent and underwent thrombectomy and DES placement. EF was 40-45% by echo. He had recurrent chest pain with EKG changes and went back to the cath lab 09/07/13. The LAD stent was patent and he had residual diffuse, diabetic CAD in the distal RCA and OM- plan is medical Rx. His B/P is borderline low and he had a SCr of 1.4 so an ACE was not added. Dr Stanford Breed feels he can be discharged 09/09/13. The pt would prefer to follow up at the [place] in [place]. We have given him a written Rx for a BMP on 09/11/13. His Metformin will be held x 48 hras and he has been instructed to stop Naprosyn.      Discharge Vitals:  Blood pressure 99/66, pulse 82, temperature 97.8 F (36.6 C), temperature source Oral, resp. rate 16, height '5\' 9"'  (1.753 m), weight 237 lb 3.4 oz (107.6 kg), SpO2 94.00%.    Labs: Results for orders placed during the hospital  encounter of 09/06/13 (from the past 24 hour(s))  TROPONIN I     Status: Abnormal   Collection Time    09/08/13 10:25 AM      Result Value Ref Range   Troponin I 19.13 (*) <0.30 ng/mL  GLUCOSE, CAPILLARY     Status: Abnormal   Collection Time    09/08/13 11:57 AM      Result Value Ref Range   Glucose-Capillary 201 (*) 70 - 99 mg/dL  TROPONIN I     Status: Abnormal   Collection Time    09/08/13  4:10 PM      Result Value Ref Range   Troponin I 16.07 (*) <0.30 ng/mL  GLUCOSE, CAPILLARY     Status: Abnormal   Collection Time    09/08/13  4:43 PM      Result Value Ref Range   Glucose-Capillary 291 (*) 70 - 99 mg/dL  GLUCOSE, CAPILLARY     Status: Abnormal   Collection Time    09/08/13  9:31 PM      Result Value Ref Range   Glucose-Capillary 178 (*) 70 - 99 mg/dL  TROPONIN I     Status: Abnormal   Collection Time    09/08/13  9:54 PM  Result Value Ref Range   Troponin I >20.00 (*) <0.30 ng/mL  BASIC METABOLIC PANEL     Status: Abnormal   Collection Time    09/09/13  4:47 AM      Result Value Ref Range   Sodium 136 (*) 137 - 147 mEq/L   Potassium 4.1  3.7 - 5.3 mEq/L   Chloride 99  96 - 112 mEq/L   CO2 24  19 - 32 mEq/L   Glucose, Bld 242 (*) 70 - 99 mg/dL   BUN 26 (*) 6 - 23 mg/dL   Creatinine, Ser 1.43 (*) 0.50 - 1.35 mg/dL   Calcium 8.6  8.4 - 10.5 mg/dL   GFR calc non Af Amer 52 (*) >90 mL/min   GFR calc Af Amer 60 (*) >90 mL/min   Anion gap 13  5 - 15  GLUCOSE, CAPILLARY     Status: Abnormal   Collection Time    09/09/13  7:26 AM      Result Value Ref Range   Glucose-Capillary 221 (*) 70 - 99 mg/dL  TROPONIN I     Status: Abnormal   Collection Time    09/09/13  9:11 AM      Result Value Ref Range   Troponin I 12.73 (*) <0.30 ng/mL  LIPID PANEL     Status: Abnormal   Collection Time    09/09/13  9:11 AM      Result Value Ref Range   Cholesterol 216 (*) 0 - 200 mg/dL   Triglycerides 229 (*) <150 mg/dL   HDL 27 (*) >39 mg/dL   Total CHOL/HDL Ratio 8.0       VLDL 46 (*) 0 - 40 mg/dL   LDL Cholesterol 143 (*) 0 - 99 mg/dL    Disposition:  Follow-up Information   Follow up with [facility]. Schedule an appointment as soon as possible for a visit in 2 weeks. (Call and make an apt)       Discharge Medications:    Medication List    STOP taking these medications       naproxen 500 MG tablet  Commonly known as:  NAPROSYN     ticagrelor 90 MG Tabs tablet  Commonly known as:  BRILINTA      TAKE these medications       acetaminophen 325 MG tablet  Commonly known as:  TYLENOL  Take 2 tablets (650 mg total) by mouth every 4 (four) hours as needed for headache or mild pain.     aspirin EC 81 MG tablet  Take 81 mg by mouth daily as needed (chest pain).     atorvastatin 80 MG tablet  Commonly known as:  LIPITOR  Take 1 tablet (80 mg total) by mouth daily at 6 PM.     furosemide 20 MG tablet  Commonly known as:  LASIX  Take 1 tablet (20 mg total) by mouth daily.     gabapentin 600 MG tablet  Commonly known as:  NEURONTIN  Take 600 mg by mouth daily.     isosorbide mononitrate 30 MG 24 hr tablet  Commonly known as:  IMDUR  Take 1 tablet (30 mg total) by mouth daily.     metFORMIN 1000 MG tablet  Commonly known as:  GLUCOPHAGE  Take 1 tablet (1,000 mg total) by mouth 2 (two) times daily with a meal.  Start taking on:  09/11/2013     metoprolol tartrate 12.5 mg Tabs tablet  Commonly known as:  [employer]  Take 0.5 tablets (12.5 mg total) by mouth 2 (two) times daily.     nitroGLYCERIN 0.4 MG SL tablet  Commonly known as:  NITROSTAT  Place 0.4 mg under the tongue every 5 (five) minutes as needed for chest pain.     prasugrel 10 MG Tabs tablet  Commonly known as:  EFFIENT  Take 1 tablet (10 mg total) by mouth daily.         Duration of Discharge Encounter: Greater than 30 minutes including physician time.  Angelena Form PA-C 09/09/2013 10:19 AM

## 2013-09-09 NOTE — Progress Notes (Signed)
Pt has walked independently without problems. Discussed MI, stent, Effient, smoking cessation, diet, ex and NTG with pt and girlfriend. Voiced understanding, sts he is going to try to do better. Not very interested in quitting smoking. Not interested in CRPII. Expect fair compliance.  [place], ACSM 11:28 AM 09/09/2013

## 2013-09-09 NOTE — Discharge Summary (Signed)
See progress notes [name]  

## 2013-09-27 ENCOUNTER — Encounter (HOSPITAL_COMMUNITY): Payer: Self-pay | Admitting: Emergency Medicine

## 2013-12-19 ENCOUNTER — Encounter (HOSPITAL_COMMUNITY): Payer: Self-pay | Admitting: Interventional Cardiology

## 2014-05-01 ENCOUNTER — Encounter (HOSPITAL_COMMUNITY): Payer: Self-pay | Admitting: *Deleted

## 2014-05-01 ENCOUNTER — Observation Stay (HOSPITAL_COMMUNITY)
Admission: EM | Admit: 2014-05-01 | Discharge: 2014-05-03 | Disposition: A | Discharge status: Home / Self Care | Payer: Medicaid Other | Attending: Internal Medicine | Admitting: Internal Medicine

## 2014-05-01 ENCOUNTER — Emergency Department (HOSPITAL_COMMUNITY): Payer: Medicaid Other

## 2014-05-01 DIAGNOSIS — E1122 Type 2 diabetes mellitus with diabetic chronic kidney disease: Secondary | ICD-10-CM

## 2014-05-01 DIAGNOSIS — I2510 Atherosclerotic heart disease of native coronary artery without angina pectoris: Secondary | ICD-10-CM | POA: Insufficient documentation

## 2014-05-01 DIAGNOSIS — E119 Type 2 diabetes mellitus without complications: Secondary | ICD-10-CM

## 2014-05-01 DIAGNOSIS — I200 Unstable angina: Secondary | ICD-10-CM | POA: Diagnosis not present

## 2014-05-01 DIAGNOSIS — J449 Chronic obstructive pulmonary disease, unspecified: Secondary | ICD-10-CM | POA: Diagnosis not present

## 2014-05-01 DIAGNOSIS — I209 Angina pectoris, unspecified: Secondary | ICD-10-CM | POA: Diagnosis not present

## 2014-05-01 DIAGNOSIS — K625 Hemorrhage of anus and rectum: Secondary | ICD-10-CM | POA: Diagnosis not present

## 2014-05-01 DIAGNOSIS — E785 Hyperlipidemia, unspecified: Secondary | ICD-10-CM | POA: Insufficient documentation

## 2014-05-01 DIAGNOSIS — R05 Cough: Secondary | ICD-10-CM | POA: Diagnosis not present

## 2014-05-01 DIAGNOSIS — N183 Chronic kidney disease, stage 3 unspecified: Secondary | ICD-10-CM | POA: Diagnosis present

## 2014-05-01 DIAGNOSIS — E1149 Type 2 diabetes mellitus with other diabetic neurological complication: Secondary | ICD-10-CM | POA: Diagnosis present

## 2014-05-01 DIAGNOSIS — F17210 Nicotine dependence, cigarettes, uncomplicated: Secondary | ICD-10-CM | POA: Diagnosis not present

## 2014-05-01 DIAGNOSIS — I252 Old myocardial infarction: Secondary | ICD-10-CM | POA: Insufficient documentation

## 2014-05-01 DIAGNOSIS — R109 Unspecified abdominal pain: Secondary | ICD-10-CM | POA: Diagnosis not present

## 2014-05-01 DIAGNOSIS — R079 Chest pain, unspecified: Principal | ICD-10-CM | POA: Insufficient documentation

## 2014-05-01 DIAGNOSIS — K449 Diaphragmatic hernia without obstruction or gangrene: Secondary | ICD-10-CM | POA: Diagnosis not present

## 2014-05-01 DIAGNOSIS — I10 Essential (primary) hypertension: Secondary | ICD-10-CM | POA: Insufficient documentation

## 2014-05-01 DIAGNOSIS — I700 Atherosclerosis of aorta: Secondary | ICD-10-CM | POA: Diagnosis not present

## 2014-05-01 DIAGNOSIS — R0602 Shortness of breath: Secondary | ICD-10-CM | POA: Diagnosis not present

## 2014-05-01 DIAGNOSIS — R52 Pain, unspecified: Secondary | ICD-10-CM

## 2014-05-01 DIAGNOSIS — I119 Hypertensive heart disease without heart failure: Secondary | ICD-10-CM | POA: Diagnosis present

## 2014-05-01 LAB — CBC WITH DIFFERENTIAL/PLATELET
BASOS ABS: 0.1 10*3/uL (ref 0.0–0.1)
Basophils Relative: 1 % (ref 0–1)
Eosinophils Absolute: 0.8 10*3/uL — ABNORMAL HIGH (ref 0.0–0.7)
Eosinophils Relative: 7 % — ABNORMAL HIGH (ref 0–5)
HCT: 43.1 % (ref 39.0–52.0)
Hemoglobin: 14 g/dL (ref 13.0–17.0)
LYMPHS PCT: 22 % (ref 12–46)
Lymphs Abs: 2.2 10*3/uL (ref 0.7–4.0)
MCH: 26 pg (ref 26.0–34.0)
MCHC: 32.5 g/dL (ref 30.0–36.0)
MCV: 80 fL (ref 78.0–100.0)
Monocytes Absolute: 0.4 10*3/uL (ref 0.1–1.0)
Monocytes Relative: 4 % (ref 3–12)
NEUTROS ABS: 6.8 10*3/uL (ref 1.7–7.7)
NEUTROS PCT: 66 % (ref 43–77)
PLATELETS: 237 10*3/uL (ref 150–400)
RBC: 5.39 MIL/uL (ref 4.22–5.81)
RDW: 16.8 % — AB (ref 11.5–15.5)
WBC: 10.2 10*3/uL (ref 4.0–10.5)

## 2014-05-01 LAB — HEPATIC FUNCTION PANEL
ALBUMIN: 3.6 g/dL (ref 3.5–5.2)
ALT: 18 U/L (ref 0–53)
AST: 16 U/L (ref 0–37)
Alkaline Phosphatase: 82 U/L (ref 39–117)
Bilirubin, Direct: <0.1 mg/dL (ref 0.0–0.5)
Total Bilirubin: 0.5 mg/dL (ref 0.3–1.2)
Total Protein: 7 g/dL (ref 6.0–8.3)

## 2014-05-01 LAB — URINALYSIS, ROUTINE W REFLEX MICROSCOPIC
BILIRUBIN URINE: NEGATIVE
Glucose, UA: 250 mg/dL — AB
HGB URINE DIPSTICK: NEGATIVE
Ketones, ur: NEGATIVE mg/dL
Leukocytes, UA: NEGATIVE
Nitrite: NEGATIVE
PROTEIN: NEGATIVE mg/dL
Specific Gravity, Urine: >1.03 — ABNORMAL HIGH (ref 1.005–1.030)
UROBILINOGEN UA: 0.2 mg/dL (ref 0.0–1.0)
pH: 5 (ref 5.0–8.0)

## 2014-05-01 LAB — BASIC METABOLIC PANEL
ANION GAP: 9 (ref 5–15)
BUN: 21 mg/dL (ref 6–23)
CHLORIDE: 102 mmol/L (ref 96–112)
CO2: 26 mmol/L (ref 19–32)
Calcium: 8.6 mg/dL (ref 8.4–10.5)
Creatinine, Ser: 1.39 mg/dL — ABNORMAL HIGH (ref 0.50–1.35)
GFR calc non Af Amer: 54 mL/min — ABNORMAL LOW (ref >90)
GFR, EST AFRICAN AMERICAN: 63 mL/min — AB (ref >90)
Glucose, Bld: 192 mg/dL — ABNORMAL HIGH (ref 70–99)
Potassium: 3.6 mmol/L (ref 3.5–5.1)
SODIUM: 137 mmol/L (ref 135–145)

## 2014-05-01 LAB — LIPASE, BLOOD: Lipase: 33 U/L (ref 11–59)

## 2014-05-01 LAB — I-STAT CG4 LACTIC ACID, ED: Lactic Acid, Venous: 2.04 mmol/L (ref 0.5–2.0)

## 2014-05-01 LAB — GLUCOSE, CAPILLARY: GLUCOSE-CAPILLARY: 197 mg/dL — AB (ref 70–99)

## 2014-05-01 LAB — TROPONIN I
Troponin I: <0.03 ng/mL (ref <0.031)
Troponin I: <0.03 ng/mL (ref <0.031)

## 2014-05-01 LAB — BRAIN NATRIURETIC PEPTIDE: B NATRIURETIC PEPTIDE 5: 206 pg/mL — AB (ref 0.0–100.0)

## 2014-05-01 LAB — POC OCCULT BLOOD, ED: Fecal Occult Bld: POSITIVE — AB

## 2014-05-01 MED ORDER — INSULIN ASPART 100 UNIT/ML ~~LOC~~ SOLN
0.0000 [IU] | Freq: Three times a day (TID) | SUBCUTANEOUS | Status: DC
  Administered 2014-05-02 (×2): 3 [IU] via SUBCUTANEOUS
  Administered 2014-05-03: 2 [IU] via SUBCUTANEOUS

## 2014-05-01 MED ORDER — METOPROLOL TARTRATE 25 MG PO TABS
25.0000 mg | ORAL_TABLET | Freq: Every day | ORAL | Status: DC
  Administered 2014-05-02: 25 mg via ORAL
  Filled 2014-05-01: qty 1

## 2014-05-01 MED ORDER — ASPIRIN 81 MG PO CHEW
324.0000 mg | CHEWABLE_TABLET | Freq: Once | ORAL | Status: AC
  Administered 2014-05-01: 324 mg via ORAL
  Filled 2014-05-01: qty 4

## 2014-05-01 MED ORDER — ACETAMINOPHEN 325 MG PO TABS: 650.0000 mg | ORAL_TABLET | ORAL | Status: DC | PRN

## 2014-05-01 MED ORDER — ONDANSETRON HCL 4 MG/2ML IJ SOLN
4.0000 mg | Freq: Four times a day (QID) | INTRAMUSCULAR | Status: DC | PRN
  Administered 2014-05-02 (×3): 4 mg via INTRAVENOUS
  Filled 2014-05-01 (×3): qty 2

## 2014-05-01 MED ORDER — INSULIN ASPART 100 UNIT/ML ~~LOC~~ SOLN: 0.0000 [IU] | Freq: Every day | SUBCUTANEOUS | Status: DC

## 2014-05-01 MED ORDER — ISOSORBIDE MONONITRATE ER 30 MG PO TB24
30.0000 mg | ORAL_TABLET | Freq: Every day | ORAL | Status: DC
  Administered 2014-05-02 – 2014-05-03 (×2): 30 mg via ORAL
  Filled 2014-05-01 (×2): qty 1

## 2014-05-01 MED ORDER — SODIUM CHLORIDE 0.9 % IV SOLN
INTRAVENOUS | Status: AC
  Administered 2014-05-01: 23:00:00 via INTRAVENOUS

## 2014-05-01 MED ORDER — MORPHINE SULFATE 2 MG/ML IJ SOLN
2.0000 mg | INTRAMUSCULAR | Status: DC | PRN
Start: 2014-05-01 — End: 2014-05-03
  Administered 2014-05-01 – 2014-05-02 (×3): 2 mg via INTRAVENOUS
  Filled 2014-05-01 (×4): qty 1

## 2014-05-01 MED ORDER — NITROGLYCERIN 0.4 MG SL SUBL
0.4000 mg | SUBLINGUAL_TABLET | SUBLINGUAL | Status: DC | PRN
  Filled 2014-05-01: qty 1

## 2014-05-01 MED ORDER — ONDANSETRON HCL 4 MG PO TABS
4.0000 mg | ORAL_TABLET | Freq: Four times a day (QID) | ORAL | Status: DC | PRN
Start: 2014-05-01 — End: 2014-05-03

## 2014-05-01 MED ORDER — SODIUM CHLORIDE 0.9 % IV SOLN
Freq: Once | INTRAVENOUS | Status: AC
  Administered 2014-05-01: 18:00:00 via INTRAVENOUS

## 2014-05-01 MED ORDER — ATORVASTATIN CALCIUM 80 MG PO TABS
80.0000 mg | ORAL_TABLET | Freq: Every day | ORAL | Status: DC
  Administered 2014-05-02 – 2014-05-03 (×2): 80 mg via ORAL
  Filled 2014-05-01 (×2): qty 1

## 2014-05-01 MED ORDER — FUROSEMIDE 20 MG PO TABS
20.0000 mg | ORAL_TABLET | Freq: Every day | ORAL | Status: DC
  Administered 2014-05-02 – 2014-05-03 (×2): 20 mg via ORAL
  Filled 2014-05-01 (×2): qty 1

## 2014-05-01 MED ORDER — SODIUM CHLORIDE 0.9 % IJ SOLN
3.0000 mL | Freq: Two times a day (BID) | INTRAMUSCULAR | Status: DC
  Administered 2014-05-01 – 2014-05-03 (×3): 3 mL via INTRAVENOUS

## 2014-05-01 MED ORDER — ASPIRIN EC 81 MG PO TBEC
81.0000 mg | DELAYED_RELEASE_TABLET | Freq: Every day | ORAL | Status: DC
  Administered 2014-05-02 – 2014-05-03 (×2): 81 mg via ORAL
  Filled 2014-05-01 (×2): qty 1

## 2014-05-01 MED ORDER — PRASUGREL HCL 10 MG PO TABS
10.0000 mg | ORAL_TABLET | Freq: Every day | ORAL | Status: DC
  Administered 2014-05-02 – 2014-05-03 (×2): 10 mg via ORAL
  Filled 2014-05-01 (×2): qty 1

## 2014-05-01 MED ORDER — LISINOPRIL 5 MG PO TABS
5.0000 mg | ORAL_TABLET | Freq: Every day | ORAL | Status: DC
  Administered 2014-05-02 – 2014-05-03 (×2): 5 mg via ORAL
  Filled 2014-05-01 (×2): qty 1

## 2014-05-01 NOTE — H&P (Addendum)
[facility] History and Physical  LASHAWN ORREGO VHQ:[id number] DOB: 18-Oct-1955 DOA: 05/01/2014  Referring physician: Dr. Wyvonnia Dusky PCP: PROVIDER NOT IN SYSTEM   Chief Complaint: Chest pain  HPI: [name] is a 59 y.o. male  This is a 59 year old man, previous history of coronary artery disease with stenting procedures, diabetic, hypertensive, who now presents with intermittent chest pain for the last week or so. The pain is similar to cardiac pain he has had before and it last from a few minutes to maybe half an hour. It does not radiate. It is not associated with sweating, nausea, dyspnea. He has had myocardial infarction in August 2015. The patient is known to have diffuse diabetic disease on cardiac catheterization previously. He also describes rectal bleeding, possibly hemorrhoidal in origin and he is compliant with aspirin and Effient. He is now being admitted for further management.   Review of Systems:  Apart from symptoms above, all systems negative.  Past Medical History  Diagnosis Date  . Hypertension   . Diabetes mellitus without complication   . Hyperlipidemia   . Coronary artery disease   . MI, old   . CAD (coronary artery disease)     STEMI-PCI   . HTN (hypertension)   . Hyperlipemia   . DM2 (diabetes mellitus, type 2)   . Tobacco use   . Obesity    Past Surgical History  Procedure Laterality Date  . Coronary angioplasty with stent placement    . Coronary angioplasty with stent placement  June 2014    LAD DES  . Coronary angioplasty with stent placement  09/06/13     STEMI (LAD ISR)  . Coronary angiogram  09/07/13    residual RCA and OM disease  . Left heart cath Bilateral 07/08/2012    Procedure: LEFT HEART CATH;  Surgeon: [name], MD;  Location: [facility] CATH LAB;  Service: Cardiovascular;  Laterality: Bilateral;  . Percutaneous coronary stent intervention (pci-s)  07/08/2012    Procedure: PERCUTANEOUS CORONARY STENT INTERVENTION (PCI-S);   Surgeon: [name], MD;  Location: [facility] CATH LAB;  Service: Cardiovascular;;  DES LAD  . Left heart catheterization with coronary angiogram N/A 09/06/2013    Procedure: LEFT HEART CATHETERIZATION WITH CORONARY ANGIOGRAM;  Surgeon: [name], MD;  Location: [facility] CATH LAB;  Service: Cardiovascular;  Laterality: N/A;  . Percutaneous coronary stent intervention (pci-s) N/A 09/06/2013    Procedure: PERCUTANEOUS CORONARY STENT INTERVENTION (PCI-S);  Surgeon: [name], MD;  Location: [facility] CATH LAB;  Service: Cardiovascular;  Laterality: N/A;  Mid LAD 3.0/24mm Promus   Social History:  reports that he has been smoking Cigarettes.  He does not have any smokeless tobacco history on file. He reports that he does not drink alcohol or use illicit drugs.  No Known Allergies   Family history: His siblings have diabetes but there does not appear to be coronary artery disease in the family.  Prior to Admission medications   Medication Sig Start Date End Date Taking? Authorizing Provider  acetaminophen (TYLENOL) 325 MG tablet Take 2 tablets (650 mg total) by mouth every 4 (four) hours as needed for headache or mild pain. 09/09/13  Yes [name], PA-C  aspirin EC 81 MG tablet Take 81 mg by mouth daily.    Yes Historical Provider, MD  atorvastatin (LIPITOR) 80 MG tablet Take 1 tablet (80 mg total) by mouth daily. 07/10/12  Yes [name], MD  furosemide (LASIX) 20 MG tablet Take 1 tablet (  20 mg total) by mouth daily. 09/09/13  Yes [name], PA-C  isosorbide mononitrate (IMDUR) 30 MG 24 hr tablet Take 1 tablet (30 mg total) by mouth daily. 09/09/13  Yes [name], PA-C  lisinopril (PRINIVIL,ZESTRIL) 5 MG tablet Take 5 mg by mouth daily.   Yes Historical Provider, MD  metFORMIN (GLUCOPHAGE) 1000 MG tablet Take 1 tablet (1,000 mg total) by mouth 2 (two) times daily with a meal. Do not restart Metformin until Wednesday am 7/2 Patient taking differently: Take 1,000 mg by mouth daily.  Do not restart Metformin until Wednesday am 7/2 07/10/12  Yes [name], MD  metoprolol tartrate (LOPRESSOR) 25 MG tablet Take 25 mg by mouth daily.   Yes Historical Provider, MD  nitroGLYCERIN (NITROSTAT) 0.4 MG SL tablet Place 1 tablet (0.4 mg total) under the tongue every 5 (five) minutes x 3 doses as needed for chest pain. 07/10/12  Yes [name], MD  prasugrel (EFFIENT) 10 MG TABS tablet Take 1 tablet (10 mg total) by mouth daily. 09/09/13  Yes [name], PA-C  amLODipine (NORVASC) 5 MG tablet Take 1 tablet (5 mg total) by mouth daily. Patient not taking: Reported on 05/01/2014 07/17/12   [name], MD  buPROPion [facility] SR) 150 MG 12 hr tablet Take 1 tablet (150 mg total) by mouth 2 (two) times daily. Patient not taking: Reported on 05/01/2014 07/10/12   [name], MD  HYDROcodone-acetaminophen (NORCO/VICODIN) 5-325 MG per tablet Take 1 tablet by mouth every 6 (six) hours as needed for severe pain. Patient not taking: Reported on 05/01/2014 11/29/12   Carmin Muskrat, MD  nicotine (NICODERM CQ) 7 mg/24hr patch Place 1 patch (7 mg total) onto the skin daily. Patient not taking: Reported on 05/01/2014 09/09/13   [name], PA-C  Ticagrelor (BRILINTA) 90 MG TABS tablet Take 1 tablet (90 mg total) by mouth 2 (two) times daily. Patient not taking: Reported on 05/01/2014 07/10/12   [name], MD   Physical Exam: Filed Vitals:   05/01/14 1604 05/01/14 1630 05/01/14 1700 05/01/14 1730  BP: 113/81 106/73 95/55 96/70   Pulse: 62 63 66 78  Temp: 97.9 F (36.6 C)     TempSrc: Oral     Resp: 17 24 26 25   Height: 5\' 9"  (1.753 m)     Weight: 112.946 kg (249 lb)     SpO2: 95% 96% 98% 96%    Wt Readings from Last 3 Encounters:  05/01/14 112.946 kg (249 lb)  09/06/13 107.6 kg (237 lb 3.4 oz)  08/22/12 110.224 kg (243 lb)    General:  Appears calm and comfortable. Obese. Does not appear to be in pain. Eyes: PERRL, normal lids, irises & conjunctiva ENT: grossly normal  hearing, lips & tongue Neck: no LAD, masses or thyromegaly Cardiovascular: RRR, no m/r/g. No LE edema. Telemetry: SR, no arrhythmias  Respiratory: CTA bilaterally, no w/r/r. Normal respiratory effort. Abdomen: soft, ntnd Skin: no rash or induration seen on limited exam Musculoskeletal: grossly normal tone BUE/BLE Psychiatric: grossly normal mood and affect, speech fluent and appropriate Neurologic: grossly non-focal.          Labs on Admission:  Basic Metabolic Panel:  Recent Labs Lab 05/01/14 1638  NA 137  K 3.6  CL 102  CO2 26  GLUCOSE 192*  BUN 21  CREATININE 1.39*  CALCIUM 8.6   Liver Function Tests: No results for input(s): AST, ALT, ALKPHOS, BILITOT, PROT, ALBUMIN in the last 168 hours.  Recent Labs Lab 05/01/14 1638  LIPASE 33   No results for input(s): AMMONIA in the last 168 hours. CBC:  Recent Labs Lab 05/01/14 1638  WBC 10.2  NEUTROABS 6.8  HGB 14.0  HCT 43.1  MCV 80.0  PLT 237   Cardiac Enzymes:  Recent Labs Lab 05/01/14 1638  TROPONINI <0.03    BNP (last 3 results)  Recent Labs  05/01/14 1638  BNP 206.0*    ProBNP (last 3 results) No results for input(s): PROBNP in the last 8760 hours.  CBG: No results for input(s): GLUCAP in the last 168 hours.  Radiological Exams on Admission: Ct Abdomen Pelvis Wo Contrast  05/01/2014   CLINICAL DATA:  Intermittent chest pain for 3 days. Bright red blood as well as dark appearing stools. Nausea and vomiting.  EXAM: CT ABDOMEN AND PELVIS WITHOUT CONTRAST  TECHNIQUE: Multidetector CT imaging of the abdomen and pelvis was performed following the standard protocol without IV contrast.  COMPARISON:  None.  FINDINGS: The lack of intravenous contrast limits the ability to evaluate solid abdominal organs.  Normal hepatic contour. Normal noncontrast appearance of the gallbladder given degree distention. No radiopaque gallstones. No ascites.  Punctate calcification about the right renal hilum is favored to  be vascular in etiology (image 38, series 2). No definite renal stones. No renal stones are seen along the expected course of either ureter or the urinary bladder. Normal noncontrast appearance of the urinary bladder given degree distention. No urinary obstruction or perinephric stranding. Normal noncontrast appearance of bilateral adrenal glands, pancreas and spleen.  Small hiatal hernia. Moderate colonic stool burden without evidence of enteric obstruction. Bowel is otherwise normal in course and caliber without discrete area of wall thickening. The appendix is not visualized, however there is no pericecal inflammatory change. No pneumoperitoneum, pneumatosis or portal venous gas.  Scattered atherosclerotic plaque within a normal caliber abdominal aorta. There is mild fusiform ectasia of the left common iliac artery measuring approximately 2.3 cm in diameter (coronal image 60, series 3).  Shotty bilateral inguinal lymph nodes are individually not enlarged by size criteria with index right-sided inguinal lymph node measuring 0.7 cm in greatest short axis diameter (image 83, series 2), presumably reactive in etiology. No bulky retroperitoneal, mesenteric, pelvic or inguinal lymphadenopathy on this noncontrast examination.  Scattered calcifications within a normal sized prostate gland. No free fluid within the pelvic cul-de-sac.  Limited visualization of the lower thorax demonstrates mild slightly asymmetric and nodular pleural parenchymal thickening (representative images 2 and 10, series 6). No associated pleural calcifications or evidence of fibrotic change. This finding is associated with asymmetric subpleural ground-glass atelectasis within the imaged caudal aspects of the right lower and middle lobes. No focal airspace opacities. No pleural effusion.  Normal heart size.  No pericardial effusion.  No acute or aggressive osseous abnormalities. Moderate DDD of L5-S1 with disc space height loss, endplate  irregularity and sclerosis. Incidental note is made of a small right sided os acetabula.  Regional soft tissues appear normal.  IMPRESSION: 1. No explanation for patient's bright red and dark appearing stools. Specifically, no evidence of enteric obstruction. 2. No evidence of nephrolithiasis urinary obstruction. 3. Asymmetric mild slightly nodular pleural parenchymal thickening involving the imaged right lower thorax without associated pleural calcifications. Correlation for history of asbestos exposure is recommended. 4. Small hiatal hernia. 5. Scattered atherosclerotic plaque within a normal caliber abdominal aorta. Mild fusiform ectasia of the right common iliac artery measuring 2.3 cm in diameter   Electronically Signed  By: Sandi Mariscal M.D.   On: 05/01/2014 18:55   Dg Chest 2 View  05/01/2014   CLINICAL DATA:  Chest pain and shortness of breath. Productive cough.  EXAM: CHEST  2 VIEW  COMPARISON:  11/29/2012 and 11/21/2009  FINDINGS: Heart size and pulmonary vascularity are normal. The lungs are somewhat hyperinflated suggesting emphysema. There is chronic peribronchial thickening and interstitial accentuation in the right mid and lower lung zone. No effusions. No acute osseous abnormality.  IMPRESSION: Chronic bronchitic changes. COPD. Slight chronic interstitial disease.   Electronically Signed   By: Lorriane Shire M.D.   On: 05/01/2014 18:19    EKG: Independently reviewed. Normal sinus rhythm without any acute ST-T wave changes.  Assessment/Plan   1. Unstable angina. The history is convincing for this. He has been seen by cardiology, Dr. Johnsie Cancel  already. Dr. Johnsie Cancel  has recommended transfer to [facility] for cardiac catheterization eventually. Since there is a possibility of rectal bleeding and he is currently chest pain-free and there are no elevated troponin levels, we will withhold intravenous heparin for the time being. 2. Diabetes. Hold metformin. Continue with sliding scale of  insulin. 3. Renal insufficiency. We will give him IV fluids for the next 12 hours. Repeat renal function tests in the morning. 4. Hypertension. Stable. 5. Rectal bleeding.This is likely hemorroidal in origin.Will ask GI to consult.  Further recommendations will depend on patient's hospital progress.   Code Status: Full code.   DVT Prophylaxis: SCDs.  Family Communication: I discussed the plan with the patient at the bedside.  Disposition Plan: Home when medically stable.  Time spent: 60 minutes.  Doree Albee [facility] Pager [phone].

## 2014-05-01 NOTE — ED Provider Notes (Signed)
CSN: [id number]     Arrival date & time 05/01/14  1553 History   First MD Initiated Contact with Patient 05/01/14 1610     Chief Complaint  Patient presents with  . Chest Pain     (Consider location/radiation/quality/duration/timing/severity/associated sxs/prior Treatment) HPI Comments: Patient with multiple complaints. Complains of intermittent central chest pain for the past several days last minutes to hours at a time. Somewhat improved with nitroglycerin. He endorses lightheadedness with standing and intermittent bright red blood per rectum after straining on the toilet. He takes aspirin and Effient. No Coumadin use. States has had ongoing pain to his testicles for the past 2 weeks that is constant. It is worse with palpation and worse with urination. When he gets the chest painin the center of his chest and radiates to his back associated with nausea and shortness of breath. Similar to his previous MI. Patient underwent catheterization in August for STEMI and had thrombosis of his stents at that time. States compliance with his medications.  The history is provided by the patient.    Past Medical History  Diagnosis Date  . Hypertension   . Diabetes mellitus without complication   . Hyperlipidemia   . Coronary artery disease   . MI, old   . CAD (coronary artery disease)     STEMI-PCI   . HTN (hypertension)   . Hyperlipemia   . DM2 (diabetes mellitus, type 2)   . Tobacco use   . Obesity    Past Surgical History  Procedure Laterality Date  . Coronary angioplasty with stent placement    . Coronary angioplasty with stent placement  June 2014    LAD DES  . Coronary angioplasty with stent placement  09/06/13     STEMI (LAD ISR)  . Coronary angiogram  09/07/13    residual RCA and OM disease  . Left heart cath Bilateral 07/08/2012    Procedure: LEFT HEART CATH;  Surgeon: [name], MD;  Location: [facility] CATH LAB;  Service: Cardiovascular;  Laterality: Bilateral;  .  Percutaneous coronary stent intervention (pci-s)  07/08/2012    Procedure: PERCUTANEOUS CORONARY STENT INTERVENTION (PCI-S);  Surgeon: [name], MD;  Location: [facility] CATH LAB;  Service: Cardiovascular;;  DES LAD  . Left heart catheterization with coronary angiogram N/A 09/06/2013    Procedure: LEFT HEART CATHETERIZATION WITH CORONARY ANGIOGRAM;  Surgeon: [name], MD;  Location: [facility] CATH LAB;  Service: Cardiovascular;  Laterality: N/A;  . Percutaneous coronary stent intervention (pci-s) N/A 09/06/2013    Procedure: PERCUTANEOUS CORONARY STENT INTERVENTION (PCI-S);  Surgeon: [name], MD;  Location: [facility] CATH LAB;  Service: Cardiovascular;  Laterality: N/A;  Mid LAD 3.0/24mm Promus   No family history on file. History  Substance Use Topics  . Smoking status: Current Every Day Smoker    Types: Cigarettes  . Smokeless tobacco: Not on file  . Alcohol Use: No     Comment: occ    Review of Systems  Constitutional: Positive for activity change, appetite change and fatigue. Negative for fever.  HENT: Negative for congestion and rhinorrhea.   Eyes: Negative for visual disturbance.  Respiratory: Positive for chest tightness and shortness of breath.   Cardiovascular: Positive for chest pain.  Gastrointestinal: Positive for nausea. Negative for vomiting.  Genitourinary: Positive for testicular pain. Negative for dysuria and hematuria.  Musculoskeletal: Negative for back pain.  Skin: Negative for rash and wound.  Neurological: Positive for dizziness, weakness and light-headedness.  A complete 10 system review  of systems was obtained and all systems are negative except as noted in the HPI and PMH.      Allergies  Review of patient's allergies indicates no known allergies.  Home Medications   Prior to Admission medications   Medication Sig Start Date End Date Taking? Authorizing Provider  acetaminophen (TYLENOL) 325 MG tablet Take 2 tablets (650 mg total) by mouth  every 4 (four) hours as needed for headache or mild pain. 09/09/13  Yes [name], PA-C  aspirin EC 81 MG tablet Take 81 mg by mouth daily.    Yes Historical Provider, MD  atorvastatin (LIPITOR) 80 MG tablet Take 1 tablet (80 mg total) by mouth daily. 07/10/12  Yes [name], MD  furosemide (LASIX) 20 MG tablet Take 1 tablet (20 mg total) by mouth daily. 09/09/13  Yes [name], PA-C  isosorbide mononitrate (IMDUR) 30 MG 24 hr tablet Take 1 tablet (30 mg total) by mouth daily. 09/09/13  Yes [name], PA-C  lisinopril (PRINIVIL,ZESTRIL) 5 MG tablet Take 5 mg by mouth daily.   Yes Historical Provider, MD  metFORMIN (GLUCOPHAGE) 1000 MG tablet Take 1 tablet (1,000 mg total) by mouth 2 (two) times daily with a meal. Do not restart Metformin until Wednesday am 7/2 Patient taking differently: Take 1,000 mg by mouth daily. Do not restart Metformin until Wednesday am 7/2 07/10/12  Yes [name], MD  metoprolol tartrate (LOPRESSOR) 25 MG tablet Take 25 mg by mouth daily.   Yes Historical Provider, MD  nitroGLYCERIN (NITROSTAT) 0.4 MG SL tablet Place 1 tablet (0.4 mg total) under the tongue every 5 (five) minutes x 3 doses as needed for chest pain. 07/10/12  Yes [name], MD  prasugrel (EFFIENT) 10 MG TABS tablet Take 1 tablet (10 mg total) by mouth daily. 09/09/13  Yes [name], PA-C  amLODipine (NORVASC) 5 MG tablet Take 1 tablet (5 mg total) by mouth daily. Patient not taking: Reported on 05/01/2014 07/17/12   [name], MD  buPROPion [facility] SR) 150 MG 12 hr tablet Take 1 tablet (150 mg total) by mouth 2 (two) times daily. Patient not taking: Reported on 05/01/2014 07/10/12   [name], MD  HYDROcodone-acetaminophen (NORCO/VICODIN) 5-325 MG per tablet Take 1 tablet by mouth every 6 (six) hours as needed for severe pain. Patient not taking: Reported on 05/01/2014 11/29/12   Carmin Muskrat, MD  nicotine (NICODERM CQ) 7 mg/24hr patch Place 1 patch (7 mg total) onto the skin  daily. Patient not taking: Reported on 05/01/2014 09/09/13   [name], PA-C  Ticagrelor (BRILINTA) 90 MG TABS tablet Take 1 tablet (90 mg total) by mouth 2 (two) times daily. Patient not taking: Reported on 05/01/2014 07/10/12   [name], MD   BP 106/65 mmHg  Pulse 70  Temp(Src) 97.4 F (36.3 C) (Axillary)  Resp 18  Ht 5\' 9"  (1.753 m)  Wt 249 lb (112.946 kg)  BMI 36.75 kg/m2  SpO2 97% Physical Exam  Constitutional: He is oriented to person, place, and time. He appears well-developed and well-nourished. No distress.  HENT:  Head: Normocephalic and atraumatic.  Mouth/Throat: Oropharynx is clear and moist. No oropharyngeal exudate.  Eyes: Conjunctivae and EOM are normal. Pupils are equal, round, and reactive to light.  Neck: Normal range of motion. Neck supple.  No meningismus.  Cardiovascular: Normal rate, regular rhythm, normal heart sounds and intact distal pulses.   No murmur heard. Pulmonary/Chest: Effort normal and  breath sounds normal. No respiratory distress.  Abdominal: Soft. There is no tenderness. There is no rebound and no guarding.  Intact femoral and DP pulses  Genitourinary:  External hemorrhoid, nonthrombosed, brown stool Bilateral testicular tenderness without erythema or edema. Symmetrical appearance  Musculoskeletal: Normal range of motion. He exhibits no edema or tenderness.  Neurological: He is alert and oriented to person, place, and time. No cranial nerve deficit. He exhibits normal muscle tone. Coordination normal.  No ataxia on finger to nose bilaterally. No pronator drift. 5/5 strength throughout. CN 2-12 intact. Negative Romberg. Equal grip strength. Sensation intact. Gait is normal.   Skin: Skin is warm.  Psychiatric: He has a normal mood and affect. His behavior is normal.  Nursing note and vitals reviewed.   ED Course  Procedures (including critical care time) Labs Review Labs Reviewed  CBC WITH DIFFERENTIAL/PLATELET - Abnormal; Notable for  the following:    RDW 16.8 (*)    Eosinophils Relative 7 (*)    Eosinophils Absolute 0.8 (*)    All other components within normal limits  BASIC METABOLIC PANEL - Abnormal; Notable for the following:    Glucose, Bld 192 (*)    Creatinine, Ser 1.39 (*)    GFR calc non Af Amer 54 (*)    GFR calc Af Amer 63 (*)    All other components within normal limits  BRAIN NATRIURETIC PEPTIDE - Abnormal; Notable for the following:    B Natriuretic Peptide 206.0 (*)    All other components within normal limits  URINALYSIS, ROUTINE W REFLEX MICROSCOPIC - Abnormal; Notable for the following:    Specific Gravity, Urine >1.030 (*)    Glucose, UA 250 (*)    All other components within normal limits  GLUCOSE, CAPILLARY - Abnormal; Notable for the following:    Glucose-Capillary 197 (*)    All other components within normal limits  POC OCCULT BLOOD, ED - Abnormal; Notable for the following:    Fecal Occult Bld POSITIVE (*)    All other components within normal limits  I-STAT CG4 LACTIC ACID, ED - Abnormal; Notable for the following:    Lactic Acid, Venous 2.04 (*)    All other components within normal limits  TROPONIN I  LIPASE, BLOOD  HEPATIC FUNCTION PANEL  TROPONIN I  TROPONIN I  TROPONIN I  COMPREHENSIVE METABOLIC PANEL  CBC  I-STAT CG4 LACTIC ACID, ED    Imaging Review Ct Abdomen Pelvis Wo Contrast  05/01/2014   CLINICAL DATA:  Intermittent chest pain for 3 days. Bright red blood as well as dark appearing stools. Nausea and vomiting.  EXAM: CT ABDOMEN AND PELVIS WITHOUT CONTRAST  TECHNIQUE: Multidetector CT imaging of the abdomen and pelvis was performed following the standard protocol without IV contrast.  COMPARISON:  None.  FINDINGS: The lack of intravenous contrast limits the ability to evaluate solid abdominal organs.  Normal hepatic contour. Normal noncontrast appearance of the gallbladder given degree distention. No radiopaque gallstones. No ascites.  Punctate calcification about the  right renal hilum is favored to be vascular in etiology (image 38, series 2). No definite renal stones. No renal stones are seen along the expected course of either ureter or the urinary bladder. Normal noncontrast appearance of the urinary bladder given degree distention. No urinary obstruction or perinephric stranding. Normal noncontrast appearance of bilateral adrenal glands, pancreas and spleen.  Small hiatal hernia. Moderate colonic stool burden without evidence of enteric obstruction. Bowel is otherwise normal in course and caliber without discrete area  of wall thickening. The appendix is not visualized, however there is no pericecal inflammatory change. No pneumoperitoneum, pneumatosis or portal venous gas.  Scattered atherosclerotic plaque within a normal caliber abdominal aorta. There is mild fusiform ectasia of the left common iliac artery measuring approximately 2.3 cm in diameter (coronal image 60, series 3).  Shotty bilateral inguinal lymph nodes are individually not enlarged by size criteria with index right-sided inguinal lymph node measuring 0.7 cm in greatest short axis diameter (image 83, series 2), presumably reactive in etiology. No bulky retroperitoneal, mesenteric, pelvic or inguinal lymphadenopathy on this noncontrast examination.  Scattered calcifications within a normal sized prostate gland. No free fluid within the pelvic cul-de-sac.  Limited visualization of the lower thorax demonstrates mild slightly asymmetric and nodular pleural parenchymal thickening (representative images 2 and 10, series 6). No associated pleural calcifications or evidence of fibrotic change. This finding is associated with asymmetric subpleural ground-glass atelectasis within the imaged caudal aspects of the right lower and middle lobes. No focal airspace opacities. No pleural effusion.  Normal heart size.  No pericardial effusion.  No acute or aggressive osseous abnormalities. Moderate DDD of L5-S1 with disc space  height loss, endplate irregularity and sclerosis. Incidental note is made of a small right sided os acetabula.  Regional soft tissues appear normal.  IMPRESSION: 1. No explanation for patient's bright red and dark appearing stools. Specifically, no evidence of enteric obstruction. 2. No evidence of nephrolithiasis urinary obstruction. 3. Asymmetric mild slightly nodular pleural parenchymal thickening involving the imaged right lower thorax without associated pleural calcifications. Correlation for history of asbestos exposure is recommended. 4. Small hiatal hernia. 5. Scattered atherosclerotic plaque within a normal caliber abdominal aorta. Mild fusiform ectasia of the right common iliac artery measuring 2.3 cm in diameter   Electronically Signed   By: Sandi Mariscal M.D.   On: 05/01/2014 18:55   Dg Chest 2 View  05/01/2014   CLINICAL DATA:  Chest pain and shortness of breath. Productive cough.  EXAM: CHEST  2 VIEW  COMPARISON:  11/29/2012 and 11/21/2009  FINDINGS: Heart size and pulmonary vascularity are normal. The lungs are somewhat hyperinflated suggesting emphysema. There is chronic peribronchial thickening and interstitial accentuation in the right mid and lower lung zone. No effusions. No acute osseous abnormality.  IMPRESSION: Chronic bronchitic changes. COPD. Slight chronic interstitial disease.   Electronically Signed   By: Lorriane Shire M.D.   On: 05/01/2014 18:19     EKG Interpretation   Date/Time:  Thursday May 01 2014 16:01:21 EDT Ventricular Rate:  66 PR Interval:  162 QRS Duration: 88 QT Interval:  420 QTC Calculation: 440 R Axis:   -35 Text Interpretation:  Sinus rhythm Left axis deviation Probable  anterolateral infarct, age indeterm Abnormal T, consider ischemia, lateral  leads Baseline wander in lead(s) V1 similar to 09/09/13 Confirmed by  Wyvonnia Dusky  MD, [place] [phone]) on 05/01/2014 4:14:03 PM      MDM   Final diagnoses:  Unstable angina  Rectal bleeding   hx DM2, HTN,  HL,smoker and CAD- s/p LAD DES June 2014, admitted through ER 09/06/13 with anterolateral STEMI and shock.  Patient with intermittent chest pain for the past 3 days similar to previous MI. EKG shows Q waves with stable ST changes anteriorly.  Brown stool on exam. Hemoccult positive. D/w Dr. Johnsie Cancel who agrees patient should be transferred to [facility] for potential cardiac catheterization.  CT abdomen obtained at cardiology request.  No acute findings.  Hemoglobin stable.  Likely hemorrhoidal bleeding. Holding  heparin at this time due to rectal bleeding and negative troponins per Dr. Johnsie Cancel. D/w Dr. Anastasio Champion who will arrange transfer to [facility]  CRITICAL CARE Performed by: Ezequiel Essex Total critical care time: 30 Critical care time was exclusive of separately billable procedures and treating other patients. Critical care was necessary to treat or prevent imminent or life-threatening deterioration. Critical care was time spent personally by me on the following activities: development of treatment plan with patient and/or surrogate as well as nursing, discussions with consultants, evaluation of patient's response to treatment, examination of patient, obtaining history from patient or surrogate, ordering and performing treatments and interventions, ordering and review of laboratory studies, ordering and review of radiographic studies, pulse oximetry and re-evaluation of patient's condition.   Ezequiel Essex, MD 05/02/14 [phone]

## 2014-05-01 NOTE — Consult Note (Signed)
CARDIOLOGY CONSULT NOTE       Patient ID: [name] MRN: [id number] DOB/AGE: [birth year] 59 y.o.  Admit date: 05/01/2014 Referring Physician:  Rancour Primary Physician: PROVIDER NOT Stanton Primary Cardiologist:  Cooper/Turner Reason for Consultation:  Chest Pain  Active Problems:   * No active hospital problems. *   HPI:  59 y.o. obese black male with HTN, DM elevated lipids  Known CAD.  Last MI 08/2013 with thrombotic occlusion of a previously stented mid LAD.  Repeat intervention done by Dr Christ Kick and cathed following day By Dr Burt Knack for pain Stent patent but has bad diffuse diabetic disease residual in OM's distal RCA and D2 jailed by stent but small vessel also.  Sedentary Not felt well for a week. Abdominal pain, pain on urination And BRP per rectum ? hemorroidal bleeding  Indicates compliance with ASA and Effient .  With other issues has had some SSCP Like previous angina.  Has taken a couple nitro this week with some relief.  Pain free now Labs are pending ECG no acute changes  ROS All other systems reviewed and negative except as noted above  Past Medical History  Diagnosis Date  . Hypertension   . Diabetes mellitus without complication   . Hyperlipidemia   . Coronary artery disease   . MI, old   . CAD (coronary artery disease)     STEMI-PCI   . HTN (hypertension)   . Hyperlipemia   . DM2 (diabetes mellitus, type 2)   . Tobacco use   . Obesity     No family history on file.  History   Social History  . Marital Status: Unknown    Spouse Name: N/A  . Number of Children: N/A  . Years of Education: N/A   Occupational History  . Not on file.   Social History Main Topics  . Smoking status: Current Every Day Smoker    Types: Cigarettes  . Smokeless tobacco: Not on file  . Alcohol Use: No     Comment: occ  . Drug Use: No     Comment: denies- last time was a couple of weeks.   Marland Kitchen Sexual Activity: Yes   Other Topics Concern  . Not on file    Social History Narrative   ** Merged History Encounter **        Past Surgical History  Procedure Laterality Date  . Coronary angioplasty with stent placement    . Coronary angioplasty with stent placement  June 2014    LAD DES  . Coronary angioplasty with stent placement  09/06/13     STEMI (LAD ISR)  . Coronary angiogram  09/07/13    residual RCA and OM disease  . Left heart cath Bilateral 07/08/2012    Procedure: LEFT HEART CATH;  Surgeon: [name], MD;  Location: [facility] CATH LAB;  Service: Cardiovascular;  Laterality: Bilateral;  . Percutaneous coronary stent intervention (pci-s)  07/08/2012    Procedure: PERCUTANEOUS CORONARY STENT INTERVENTION (PCI-S);  Surgeon: [name], MD;  Location: [facility] CATH LAB;  Service: Cardiovascular;;  DES LAD  . Left heart catheterization with coronary angiogram N/A 09/06/2013    Procedure: LEFT HEART CATHETERIZATION WITH CORONARY ANGIOGRAM;  Surgeon: [name], MD;  Location: [facility] CATH LAB;  Service: Cardiovascular;  Laterality: N/A;  . Percutaneous coronary stent intervention (pci-s) N/A 09/06/2013    Procedure: PERCUTANEOUS CORONARY STENT INTERVENTION (PCI-S);  Surgeon: [name], MD;  Location: [facility] CATH LAB;  Service: Cardiovascular;  Laterality: N/A;  Mid LAD 3.0/24mm Promus     No current facility-administered medications on file prior to encounter.   Current Outpatient Prescriptions on File Prior to Encounter  Medication Sig Dispense Refill  . acetaminophen (TYLENOL) 325 MG tablet Take 2 tablets (650 mg total) by mouth every 4 (four) hours as needed for headache or mild pain.    Marland Kitchen aspirin EC 81 MG tablet Take 81 mg by mouth daily.     Marland Kitchen atorvastatin (LIPITOR) 80 MG tablet Take 1 tablet (80 mg total) by mouth daily. 30 tablet 11  . furosemide (LASIX) 20 MG tablet Take 1 tablet (20 mg total) by mouth daily. 30 tablet 11  . isosorbide mononitrate (IMDUR) 30 MG 24 hr tablet Take 1 tablet (30 mg total) by mouth daily.  30 tablet 11  . metFORMIN (GLUCOPHAGE) 1000 MG tablet Take 1 tablet (1,000 mg total) by mouth 2 (two) times daily with a meal. Do not restart Metformin until Wednesday am 7/2 (Patient taking differently: Take 1,000 mg by mouth daily. Do not restart Metformin until Wednesday am 7/2)    . nitroGLYCERIN (NITROSTAT) 0.4 MG SL tablet Place 1 tablet (0.4 mg total) under the tongue every 5 (five) minutes x 3 doses as needed for chest pain. 25 tablet 12  . prasugrel (EFFIENT) 10 MG TABS tablet Take 1 tablet (10 mg total) by mouth daily. 30 tablet 11  . amLODipine (NORVASC) 5 MG tablet Take 1 tablet (5 mg total) by mouth daily. (Patient not taking: Reported on 05/01/2014) 30 tablet 11  . buPROPion (WELLBUTRIN SR) 150 MG 12 hr tablet Take 1 tablet (150 mg total) by mouth 2 (two) times daily. (Patient not taking: Reported on 05/01/2014) 60 tablet 11  . HYDROcodone-acetaminophen (NORCO/VICODIN) 5-325 MG per tablet Take 1 tablet by mouth every 6 (six) hours as needed for severe pain. (Patient not taking: Reported on 05/01/2014) 10 tablet 0  . nicotine (NICODERM CQ) 7 mg/24hr patch Place 1 patch (7 mg total) onto the skin daily. (Patient not taking: Reported on 05/01/2014) 28 patch 0  . Ticagrelor (BRILINTA) 90 MG TABS tablet Take 1 tablet (90 mg total) by mouth 2 (two) times daily. (Patient not taking: Reported on 05/01/2014) 60 tablet 11      Physical Exam: Blood pressure 113/81, pulse 62, temperature 97.9 F (36.6 C), temperature source Oral, resp. rate 17, height 5\' 9"  (1.753 m), weight 249 lb (112.946 kg), SpO2 95 %.   Affect appropriate Obese black male  HEENT: normal Neck supple with no adenopathy JVP normal no bruits no thyromegaly Lungs clear with no wheezing and good diaphragmatic motion Heart:  S1/S2 no murmur, no rub, gallop or click PMI normal Abdomen: supra pubic and RUQ pain previous appendectomy scar no bruit.  No HSM or HJR Distal pulses intact with no bruits Plus one bilateral edema Neuro  non-focal Skin warm and dry No muscular weakness   Labs:   Lab Results  Component Value Date   WBC 10.2 05/01/2014   HGB 14.0 05/01/2014   HCT 43.1 05/01/2014   MCV 80.0 05/01/2014   PLT 237 05/01/2014   No results for input(s): NA, K, CL, CO2, BUN, CREATININE, CALCIUM, PROT, BILITOT, ALKPHOS, ALT, AST, GLUCOSE in the last 168 hours.  Invalid input(s): LABALBU Lab Results  Component Value Date   CKTOTAL 1191* 09/06/2013   CKMB 143.5* 09/06/2013   TROPONINI 12.73* 09/09/2013    Lab Results  Component Value Date   CHOL 216* 09/09/2013   CHOL  122 07/15/2012   CHOL 155 07/08/2012   Lab Results  Component Value Date   HDL 27* 09/09/2013   HDL 19* 07/15/2012   HDL 25* 07/08/2012   Lab Results  Component Value Date   LDLCALC 143* 09/09/2013   LDLCALC 76 07/15/2012   LDLCALC 101* 07/08/2012   Lab Results  Component Value Date   TRIG 229* 09/09/2013   TRIG 136 07/15/2012   TRIG 146 07/08/2012   Lab Results  Component Value Date   CHOLHDL 8.0 09/09/2013   CHOLHDL 6.4 07/15/2012   CHOLHDL 6.2 07/08/2012   No results found for: LDLDIRECT    Radiology: No results found.  EKG:  05/01/14  SR rate 63 old anterolateral MI no acute ST changes   ASSESSMENT AND PLAN:  Chest Pain:  Likely angina given known diffuse distal 3VD and jailed D2  No acute ECG changes.  Continue ASA and Effient  as Hct stable and supect hemoroidal bleeding  Hold on heparin Unless enzymes positive until GI sees and stools guaic done.    Abdominal Pain:  May need CT.  And GB US  Check UA and LFTls with amylase and lipase  WBC ok    DM:  Hold metformin cover with SS insulin   Chol On statin  Dr Wyvonnia Dusky to transfer patient to [facility] under Hospitalist service  I have notified DOD Dr Recardo Evangelist about consult and he will put on our card.  Not clear that he will be ready for Cath tomorrow given bleeding and abdominal pain  Jenkins Rouge   Signed: Jenkins Rouge 05/01/2014, 4:57 PM

## 2014-05-01 NOTE — ED Notes (Addendum)
Intermittent CP x 3 days. Pt took 2 NTS SL prior to EMS arriving, with no relief. Dizziness with standing. Also states bright red blood along with dark stools at home. CBG 206 en route. Pt has also had nausea, vomited x 2 yesterday. One loose stool yesterday as well. Pt also states pain to his testicles x 1-2 weeks.

## 2014-05-02 ENCOUNTER — Inpatient Hospital Stay (HOSPITAL_COMMUNITY): Payer: Medicaid Other

## 2014-05-02 DIAGNOSIS — K625 Hemorrhage of anus and rectum: Secondary | ICD-10-CM | POA: Diagnosis not present

## 2014-05-02 DIAGNOSIS — I10 Essential (primary) hypertension: Secondary | ICD-10-CM | POA: Diagnosis not present

## 2014-05-02 DIAGNOSIS — I200 Unstable angina: Secondary | ICD-10-CM | POA: Diagnosis not present

## 2014-05-02 DIAGNOSIS — I2510 Atherosclerotic heart disease of native coronary artery without angina pectoris: Secondary | ICD-10-CM

## 2014-05-02 DIAGNOSIS — E118 Type 2 diabetes mellitus with unspecified complications: Secondary | ICD-10-CM

## 2014-05-02 DIAGNOSIS — E119 Type 2 diabetes mellitus without complications: Secondary | ICD-10-CM | POA: Diagnosis not present

## 2014-05-02 DIAGNOSIS — N433 Hydrocele, unspecified: Secondary | ICD-10-CM | POA: Diagnosis not present

## 2014-05-02 DIAGNOSIS — E1122 Type 2 diabetes mellitus with diabetic chronic kidney disease: Secondary | ICD-10-CM | POA: Diagnosis not present

## 2014-05-02 LAB — URINALYSIS, ROUTINE W REFLEX MICROSCOPIC
Bilirubin Urine: NEGATIVE
GLUCOSE, UA: 500 mg/dL — AB
Hgb urine dipstick: NEGATIVE
Ketones, ur: NEGATIVE mg/dL
LEUKOCYTES UA: NEGATIVE
Nitrite: NEGATIVE
PH: 5 (ref 5.0–8.0)
Protein, ur: NEGATIVE mg/dL
Specific Gravity, Urine: 1.023 (ref 1.005–1.030)
Urobilinogen, UA: 0.2 mg/dL (ref 0.0–1.0)

## 2014-05-02 LAB — COMPREHENSIVE METABOLIC PANEL
ALK PHOS: 81 U/L (ref 39–117)
ALT: 17 U/L (ref 0–53)
AST: 14 U/L (ref 0–37)
Albumin: 3.1 g/dL — ABNORMAL LOW (ref 3.5–5.2)
Anion gap: 8 (ref 5–15)
BUN: 18 mg/dL (ref 6–23)
CALCIUM: 8.2 mg/dL — AB (ref 8.4–10.5)
CO2: 24 mmol/L (ref 19–32)
Chloride: 106 mmol/L (ref 96–112)
Creatinine, Ser: 1.29 mg/dL (ref 0.50–1.35)
GFR calc Af Amer: 69 mL/min — ABNORMAL LOW (ref >90)
GFR calc non Af Amer: 59 mL/min — ABNORMAL LOW (ref >90)
GLUCOSE: 169 mg/dL — AB (ref 70–99)
Potassium: 4 mmol/L (ref 3.5–5.1)
Sodium: 138 mmol/L (ref 135–145)
TOTAL PROTEIN: 6.1 g/dL (ref 6.0–8.3)
Total Bilirubin: 0.4 mg/dL (ref 0.3–1.2)

## 2014-05-02 LAB — CBC
HEMATOCRIT: 41 % (ref 39.0–52.0)
Hemoglobin: 13.4 g/dL (ref 13.0–17.0)
MCH: 25.7 pg — ABNORMAL LOW (ref 26.0–34.0)
MCHC: 32.7 g/dL (ref 30.0–36.0)
MCV: 78.7 fL (ref 78.0–100.0)
PLATELETS: 225 10*3/uL (ref 150–400)
RBC: 5.21 MIL/uL (ref 4.22–5.81)
RDW: 16.8 % — AB (ref 11.5–15.5)
WBC: 9.8 10*3/uL (ref 4.0–10.5)

## 2014-05-02 LAB — GLUCOSE, CAPILLARY
Glucose-Capillary: 159 mg/dL — ABNORMAL HIGH (ref 70–99)
Glucose-Capillary: 120 mg/dL — ABNORMAL HIGH (ref 70–99)
Glucose-Capillary: 152 mg/dL — ABNORMAL HIGH (ref 70–99)
Glucose-Capillary: 133 mg/dL — ABNORMAL HIGH (ref 70–99)

## 2014-05-02 LAB — TYPE AND SCREEN
ABO/RH(D): A POS
Antibody Screen: NEGATIVE

## 2014-05-02 LAB — TROPONIN I

## 2014-05-02 LAB — ABO/RH: ABO/RH(D): A POS

## 2014-05-02 MED ORDER — WHITE PETROLATUM GEL
Status: DC | PRN
  Administered 2014-05-02: 0.2 via TOPICAL
  Filled 2014-05-02: qty 1

## 2014-05-02 MED ORDER — HEPARIN SODIUM (PORCINE) 5000 UNIT/ML IJ SOLN
5000.0000 [IU] | Freq: Three times a day (TID) | INTRAMUSCULAR | Status: DC
  Administered 2014-05-02 – 2014-05-03 (×3): 5000 [IU] via SUBCUTANEOUS
  Filled 2014-05-02 (×3): qty 1

## 2014-05-02 MED ORDER — NICOTINE 21 MG/24HR TD PT24
21.0000 mg | MEDICATED_PATCH | Freq: Every day | TRANSDERMAL | Status: DC
  Administered 2014-05-02: 21 mg via TRANSDERMAL

## 2014-05-02 MED ORDER — CARVEDILOL 6.25 MG PO TABS
6.2500 mg | ORAL_TABLET | Freq: Two times a day (BID) | ORAL | Status: DC
  Administered 2014-05-03: 6.25 mg via ORAL
  Filled 2014-05-02 (×2): qty 1

## 2014-05-02 MED ORDER — WHITE PETROLATUM GEL: Status: DC | PRN

## 2014-05-02 MED ORDER — AMLODIPINE BESYLATE 5 MG PO TABS
5.0000 mg | ORAL_TABLET | Freq: Every day | ORAL | Status: DC
  Administered 2014-05-02 – 2014-05-03 (×2): 5 mg via ORAL
  Filled 2014-05-02 (×2): qty 1

## 2014-05-02 MED ORDER — HYDROCODONE-ACETAMINOPHEN 5-325 MG PO TABS
1.0000 | ORAL_TABLET | Freq: Four times a day (QID) | ORAL | Status: DC | PRN
  Administered 2014-05-02 – 2014-05-03 (×3): 1 via ORAL
  Filled 2014-05-02 (×3): qty 1

## 2014-05-02 MED ORDER — NICOTINE 21 MG/24HR TD PT24
21.0000 mg | MEDICATED_PATCH | Freq: Every day | TRANSDERMAL | Status: DC
  Filled 2014-05-02: qty 1

## 2014-05-02 NOTE — Progress Notes (Addendum)
Patient Demographics  [name], is a 59 y.o. male, DOB - 05-Nov-1955, HYI:[id number]  Admit date - 05/01/2014   Admitting Physician Etta Quill, DO  Outpatient Primary MD for the patient is PROVIDER NOT [facility]  LOS - 1   Chief Complaint  Patient presents with  . Chest Pain        Subjective:   [name] today has, No headache, No chest pain, No abdominal pain - No Nausea, No new weakness tingling or numbness, No Cough - SOB.   Assessment & Plan    1. Unstable angina. History of CAD with drug-eluting stent placement 2 last in 2015. Continues to smoke and is questionable compliance. Currently troponin negative, he is at this moment chest pain-free, cardiology on board. New full medical treatment which includes aspirin, statin, Effient and beta blocker along with ARB. Nitroglycerin as needed IV. Further management per cardiology.   2. Smoking. Counseled to quit    3. Dyslipidemia continue home dose statin.    4. DM type II. Currently on sliding scale  Lab Results  Component Value Date   HGBA1C 8.5* 07/07/2012    CBG (last 3)   Recent Labs  05/01/14 2238 05/02/14 0734  GLUCAP 197* 159*     5. Essential hypertension continue combination of beta blocker, diuretic and Ace and monitor.    6. Intermittent hemorrhoidal bleed. For several years. Stable no acute issue.  CT abdomen pelvis nonacute.   7. Pleural plaques noted on CT abdomen pelvis. Outpatient follow-up with PCP.   8. CK D stage III. Baseline creatinine close to 1.4. Monitor. At baseline.   9. Chr. systolic and diastolic CHF. Last EF 40-45%. Likely has underlying ischemic cardio myopathy. Currently compensated. Continue home regimen of beta blocker, Ace and diuretic.     Code Status:  Full  Family Communication: none  Disposition Plan: TBD   Procedures    Consults  Cards   Medications  Scheduled Meds: . sodium chloride   Intravenous STAT  . aspirin EC  81 mg Oral Daily  . atorvastatin  80 mg Oral Daily  . furosemide  20 mg Oral Daily  . insulin aspart  0-15 Units Subcutaneous TID WC  . insulin aspart  0-5 Units Subcutaneous QHS  . isosorbide mononitrate  30 mg Oral Daily  . lisinopril  5 mg Oral Daily  . metoprolol tartrate  25 mg Oral Daily  . prasugrel  10 mg Oral Daily  . sodium chloride  3 mL Intravenous Q12H   Continuous Infusions:  PRN Meds:.acetaminophen, morphine injection, nitroGLYCERIN, ondansetron **OR** ondansetron (ZOFRAN) IV  DVT Prophylaxis    Heparin    Lab Results  Component Value Date   PLT 225 05/02/2014    Antibiotics    Anti-infectives    None          Objective:   Filed Vitals:   05/02/14 0100 05/02/14 0424 05/02/14 0512 05/02/14 0850  BP: 112/62 93/50 102/67 112/68  Pulse:  74    Temp:  97.7 F (36.5 C)    TempSrc:  Oral    Resp:  18    Height:      Weight:  117.209 kg (258 lb 6.4 oz)    SpO2:  94%      Wt Readings from Last 3 Encounters:  05/02/14 117.209 kg (258 lb 6.4 oz)  09/06/13 107.6 kg (237 lb 3.4 oz)  08/22/12 110.224 kg (243 lb)     Intake/Output Summary (Last 24 hours) at 05/02/14 1048 Last data filed at 05/02/14 0500  Gross per 24 hour  Intake      0 ml  Output    400 ml  Net   -400 ml     Physical Exam  Awake Alert, Oriented X 3, No new F.N deficits, Normal affect [place].AT,PERRAL Supple Neck,No JVD, No cervical lymphadenopathy appriciated.  Symmetrical Chest wall movement, Good air movement bilaterally, CTAB RRR,No Gallops,Rubs or new Murmurs, No Parasternal Heave +ve B.Sounds, Abd Soft, No tenderness, No organomegaly appriciated, No rebound - guarding or rigidity. No Cyanosis, Clubbing or edema, No new Rash or bruise      Data Review   Micro Results No results found for  this or any previous visit (from the past 240 hour(s)).  Radiology Reports Ct Abdomen Pelvis Wo Contrast  05/01/2014   CLINICAL DATA:  Intermittent chest pain for 3 days. Bright red blood as well as dark appearing stools. Nausea and vomiting.  EXAM: CT ABDOMEN AND PELVIS WITHOUT CONTRAST  TECHNIQUE: Multidetector CT imaging of the abdomen and pelvis was performed following the standard protocol without IV contrast.  COMPARISON:  None.  FINDINGS: The lack of intravenous contrast limits the ability to evaluate solid abdominal organs.  Normal hepatic contour. Normal noncontrast appearance of the gallbladder given degree distention. No radiopaque gallstones. No ascites.  Punctate calcification about the right renal hilum is favored to be vascular in etiology (image 38, series 2). No definite renal stones. No renal stones are seen along the expected course of either ureter or the urinary bladder. Normal noncontrast appearance of the urinary bladder given degree distention. No urinary obstruction or perinephric stranding. Normal noncontrast appearance of bilateral adrenal glands, pancreas and spleen.  Small hiatal hernia. Moderate colonic stool burden without evidence of enteric obstruction. Bowel is otherwise normal in course and caliber without discrete area of wall thickening. The appendix is not visualized, however there is no pericecal inflammatory change. No pneumoperitoneum, pneumatosis or portal venous gas.  Scattered atherosclerotic plaque within a normal caliber abdominal aorta. There is mild fusiform ectasia of the left common iliac artery measuring approximately 2.3 cm in diameter (coronal image 60, series 3).  Shotty bilateral inguinal lymph nodes are individually not enlarged by size criteria with index right-sided inguinal lymph node measuring 0.7 cm in greatest short axis diameter (image 83, series 2), presumably reactive in etiology. No bulky retroperitoneal, mesenteric, pelvic or inguinal  lymphadenopathy on this noncontrast examination.  Scattered calcifications within a normal sized prostate gland. No free fluid within the pelvic cul-de-sac.  Limited visualization of the lower thorax demonstrates mild slightly asymmetric and nodular pleural parenchymal thickening (representative images 2 and 10, series 6). No associated pleural calcifications or evidence of fibrotic change. This finding is associated with asymmetric subpleural ground-glass atelectasis within the imaged caudal aspects of the right lower and middle lobes. No focal airspace opacities. No pleural effusion.  Normal heart size.  No pericardial effusion.  No acute or aggressive osseous abnormalities. Moderate DDD of L5-S1 with disc space height loss, endplate irregularity and sclerosis. Incidental note is made of a small right sided os acetabula.  Regional soft tissues appear normal.  IMPRESSION: 1. No explanation for patient's bright red and dark appearing stools. Specifically, no evidence of  enteric obstruction. 2. No evidence of nephrolithiasis urinary obstruction. 3. Asymmetric mild slightly nodular pleural parenchymal thickening involving the imaged right lower thorax without associated pleural calcifications. Correlation for history of asbestos exposure is recommended. 4. Small hiatal hernia. 5. Scattered atherosclerotic plaque within a normal caliber abdominal aorta. Mild fusiform ectasia of the right common iliac artery measuring 2.3 cm in diameter   Electronically Signed   By: Sandi Mariscal M.D.   On: 05/01/2014 18:55   Dg Chest 2 View  05/01/2014   CLINICAL DATA:  Chest pain and shortness of breath. Productive cough.  EXAM: CHEST  2 VIEW  COMPARISON:  11/29/2012 and 11/21/2009  FINDINGS: Heart size and pulmonary vascularity are normal. The lungs are somewhat hyperinflated suggesting emphysema. There is chronic peribronchial thickening and interstitial accentuation in the right mid and lower lung zone. No effusions. No acute  osseous abnormality.  IMPRESSION: Chronic bronchitic changes. COPD. Slight chronic interstitial disease.   Electronically Signed   By: Lorriane Shire M.D.   On: 05/01/2014 18:19     CBC  Recent Labs Lab 05/01/14 1638 05/02/14 0650  WBC 10.2 9.8  HGB 14.0 13.4  HCT 43.1 41.0  PLT 237 225  MCV 80.0 78.7  MCH 26.0 25.7*  MCHC 32.5 32.7  RDW 16.8* 16.8*  LYMPHSABS 2.2  --   MONOABS 0.4  --   EOSABS 0.8*  --   BASOSABS 0.1  --     Chemistries   Recent Labs Lab 05/01/14 1638 05/02/14 0650  NA 137 138  K 3.6 4.0  CL 102 106  CO2 26 24  GLUCOSE 192* 169*  BUN 21 18  CREATININE 1.39* 1.29  CALCIUM 8.6 8.2*  AST 16 14  ALT 18 17  ALKPHOS 82 81  BILITOT 0.5 0.4   ------------------------------------------------------------------------------------------------------------------ estimated creatinine clearance is 77.9 mL/min (by C-G formula based on Cr of 1.29). ------------------------------------------------------------------------------------------------------------------ No results for input(s): HGBA1C in the last 72 hours. ------------------------------------------------------------------------------------------------------------------ No results for input(s): CHOL, HDL, LDLCALC, TRIG, CHOLHDL, LDLDIRECT in the last 72 hours. ------------------------------------------------------------------------------------------------------------------ No results for input(s): TSH, T4TOTAL, T3FREE, THYROIDAB in the last 72 hours.  Invalid input(s): FREET3 ------------------------------------------------------------------------------------------------------------------ No results for input(s): VITAMINB12, FOLATE, FERRITIN, TIBC, IRON, RETICCTPCT in the last 72 hours.  Coagulation profile No results for input(s): INR, PROTIME in the last 168 hours.  No results for input(s): DDIMER in the last 72 hours.  Cardiac Enzymes  Recent Labs Lab 05/01/14 1638 05/01/14 1938 05/02/14 0650   TROPONINI <0.03 <0.03 <0.03   ------------------------------------------------------------------------------------------------------------------ Invalid input(s): POCBNP     Time Spent in minutes   35   Saket Hellstrom K M.D on 05/02/2014 at 10:48 AM  Between 7am to 7pm - Pager - [phone]  After 7pm go to [email or web address] - password [facility]  [facility]   Office  [phone]

## 2014-05-02 NOTE — Progress Notes (Signed)
Subjective: He reports CP comes and goes at rest.  It feels sharp radiating to his back.  May last 15 minutes to an hour.  It is not brought on by walking up stairs.  It decreases after belching  Objective: Vital signs in last 24 hours: Temp:  [97.4 F (36.3 C)-98.1 F (36.7 C)] 97.7 F (36.5 C) (04/22 0424) Pulse Rate:  [62-78] 74 (04/22 0424) Resp:  [17-28] 18 (04/22 0424) BP: (93-119)/(50-81) 112/68 mmHg (04/22 0850) SpO2:  [94 %-98 %] 94 % (04/22 0424) Weight:  [249 lb (112.946 kg)-258 lb 6.4 oz (117.209 kg)] 258 lb 6.4 oz (117.209 kg) (04/22 0424)    Intake/Output from previous day: 04/21 0701 - 04/22 0700 In: -  Out: 400 [Urine:400] Intake/Output this shift:    Medications Current Facility-Administered Medications  Medication Dose Route Frequency Provider Last Rate Last Dose  . 0.9 %  sodium chloride infusion   Intravenous STAT Ezequiel Essex, MD 50 mL/hr at 05/01/14 2320    . acetaminophen (TYLENOL) tablet 650 mg  650 mg Oral Q4H PRN Nimish C Gosrani, MD      . aspirin EC tablet 81 mg  81 mg Oral Daily Nimish C Anastasio Champion, MD   81 mg at 05/02/14 0847  . atorvastatin (LIPITOR) tablet 80 mg  80 mg Oral Daily Nimish C Anastasio Champion, MD   80 mg at 05/02/14 0847  . furosemide (LASIX) tablet 20 mg  20 mg Oral Daily Doree Albee, MD   20 mg at 05/01/14 1915  . insulin aspart (novoLOG) injection 0-15 Units  0-15 Units Subcutaneous TID WC Doree Albee, MD   3 Units at 05/02/14 0846  . insulin aspart (novoLOG) injection 0-5 Units  0-5 Units Subcutaneous QHS Doree Albee, MD   0 Units at 05/01/14 2310  . isosorbide mononitrate (IMDUR) 24 hr tablet 30 mg  30 mg Oral Daily Nimish C Anastasio Champion, MD   30 mg at 05/02/14 0847  . lisinopril (PRINIVIL,ZESTRIL) tablet 5 mg  5 mg Oral Daily Nimish Luther Parody, MD   5 mg at 05/02/14 0848  . metoprolol tartrate (LOPRESSOR) tablet 25 mg  25 mg Oral Daily Doree Albee, MD   25 mg at 05/02/14 0848  . morphine 2 MG/ML injection 2 mg  2 mg  Intravenous Q4H PRN Doree Albee, MD   2 mg at 05/02/14 0519  . nitroGLYCERIN (NITROSTAT) SL tablet 0.4 mg  0.4 mg Sublingual Q5 Min x 3 PRN Nimish C Gosrani, MD      . ondansetron (ZOFRAN) tablet 4 mg  4 mg Oral Q6H PRN Nimish C Gosrani, MD       Or  . ondansetron (ZOFRAN) injection 4 mg  4 mg Intravenous Q6H PRN Nimish Luther Parody, MD   4 mg at 05/02/14 0519  . prasugrel (EFFIENT) tablet 10 mg  10 mg Oral Daily Doree Albee, MD   10 mg at 05/02/14 0849  . sodium chloride 0.9 % injection 3 mL  3 mL Intravenous Q12H Nimish C Anastasio Champion, MD   3 mL at 05/01/14 2200    PE: General appearance: alert, cooperative and no distress Lungs: clear to auscultation bilaterally Heart: regular rate and rhythm, S1, S2 normal, no murmur, click, rub or gallop Extremities: No LEE Pulses: 2+ and symmetric Skin: Warm and dry Neurologic: Grossly normal  Lab Results:   Recent Labs  05/01/14 1638 05/02/14 0650  WBC 10.2 9.8  HGB 14.0 13.4  HCT 43.1 41.0  PLT 237 225   BMET  Recent Labs  05/01/14 1638 05/02/14 0650  NA 137 138  K 3.6 4.0  CL 102 106  CO2 26 24  GLUCOSE 192* 169*  BUN 21 18  CREATININE 1.39* 1.29  CALCIUM 8.6 8.2*   Cardiac Panel (last 3 results)  Recent Labs  05/01/14 1638 05/01/14 1938 05/02/14 0650  TROPONINI <0.03 <0.03 <0.03    Assessment/Plan 59 y.o. obese black male with HTN, DM elevated lipids Known CAD. Last MI 08/2013 with thrombotic occlusion of a previously stented mid LAD. Repeat intervention done by Dr Christ Kick and cathed following day By Dr Burt Knack for pain Stent patent but has bad diffuse diabetic disease residual in OM's distal RCA and D2 jailed by stent but small vessel also. Sedentary Not felt well for a week. Abdominal pain, pain on urination And BRP per rectum ? hemorroidal bleeding Indicates compliance with ASA and Effient . With other issues has had some SSCP Like previous angina. Has taken a couple nitro this week with some  relief.  Active Problems:   DM (diabetes mellitus)   HTN (hypertension)  Some mild hypotension   CAD (coronary artery disease)   Stage 3 chronic renal impairment associated with type 2 diabetes mellitus  SCr improved slightly.    Chest Pain Cp sounds noncardiac; not instigated by walking up stairs. Improved after belching.  Troponin negative x three.  ASA, lipitor 80, imdur 30, lopressor 25 daily, lisinopril 2, lasix 20 daily, effient 10.  Fecal occult blood +.  Hgb 13.4.  He is on Effient which he does occasionally miss a dose of.  May be related to hemorrhoids.  BP stable with mild hypotension at times.   Lipase negative.     LOS: 1 day    HAGER, BRYAN PA-C 05/02/2014 10:08 AM   Patient seen and examined. Agree with assessment and plan. Pt is s/p recent cath 8 mo ago with diffuse diabetic disease. He has probable hemroidal bleeding and Stage 3 CKD. Upon further questioning there is a cocaine history with last use ~ 2 weeks ago ( uses about 1 - 2 x/month). Will add amlodipine 5 mg to medical regimen. With DM will change lopressor to carvedilol 6.25 mg bid which also has alpha in addition to b blockade and less potential vasospasm. ECG with PRWP and lateral T changes. Will not re-cath presently unless recurrent increasing anginal symptoms on medical therapy.  Consider ranexa.Troy Sine, MD, [facility] 05/02/2014 11:15 AM

## 2014-05-02 NOTE — Care Management Note (Unsigned)
    Page 1 of 1   05/02/2014     4:34:29 PM CARE MANAGEMENT NOTE 05/02/2014  Patient:  [name]   Account Number:  [id number]  Date Initiated:  05/02/2014  Documentation initiated by:  GRAVES-BIGELOW,BRENDA  Subjective/Objective Assessment:   Pt admitted for cp.     Action/Plan:   CM to monitor for disposition needs.   Anticipated DC Date:  05/03/2014   Anticipated DC Plan:  [place]  CM consult      Choice offered to / List presented to:             Status of service:  In process, will continue to follow Medicare Important Message given?  YES (If response is "NO", the following Medicare IM given date fields will be blank) Date Medicare IM given:  05/02/2014 Medicare IM given by:  Elenor Quinones Date Additional Medicare IM given:   Additional Medicare IM given by:    Discharge Disposition:    Per UR Regulation:  Reviewed for med. necessity/level of care/duration of stay  If discussed at [place] of Stay Meetings, dates discussed:    Comments:

## 2014-05-03 DIAGNOSIS — I110 Hypertensive heart disease with heart failure: Secondary | ICD-10-CM

## 2014-05-03 DIAGNOSIS — E119 Type 2 diabetes mellitus without complications: Secondary | ICD-10-CM | POA: Diagnosis not present

## 2014-05-03 DIAGNOSIS — I509 Heart failure, unspecified: Secondary | ICD-10-CM

## 2014-05-03 DIAGNOSIS — I25119 Atherosclerotic heart disease of native coronary artery with unspecified angina pectoris: Secondary | ICD-10-CM | POA: Diagnosis not present

## 2014-05-03 DIAGNOSIS — I200 Unstable angina: Secondary | ICD-10-CM | POA: Diagnosis not present

## 2014-05-03 DIAGNOSIS — E1122 Type 2 diabetes mellitus with diabetic chronic kidney disease: Secondary | ICD-10-CM | POA: Diagnosis not present

## 2014-05-03 DIAGNOSIS — I10 Essential (primary) hypertension: Secondary | ICD-10-CM | POA: Diagnosis not present

## 2014-05-03 LAB — GLUCOSE, CAPILLARY
GLUCOSE-CAPILLARY: 155 mg/dL — AB (ref 70–99)
Glucose-Capillary: 154 mg/dL — ABNORMAL HIGH (ref 70–99)
Glucose-Capillary: 168 mg/dL — ABNORMAL HIGH (ref 70–99)

## 2014-05-03 MED ORDER — NITROGLYCERIN 0.4 MG SL SUBL: 0.4000 mg | SUBLINGUAL_TABLET | SUBLINGUAL | Status: DC | PRN

## 2014-05-03 MED ORDER — MAGNESIUM CITRATE PO SOLN
1.0000 | Freq: Once | ORAL | Status: AC
  Administered 2014-05-03: 1 via ORAL
  Filled 2014-05-03: qty 296

## 2014-05-03 MED ORDER — CARVEDILOL 6.25 MG PO TABS: 6.2500 mg | ORAL_TABLET | Freq: Two times a day (BID) | ORAL | Status: DC

## 2014-05-03 MED ORDER — NICOTINE 7 MG/24HR TD PT24: 7.0000 mg | MEDICATED_PATCH | Freq: Every day | TRANSDERMAL | Status: DC

## 2014-05-03 NOTE — Discharge Summary (Signed)
[name], is a 59 y.o. male  DOB October 19, 1955  MRN [id number].  Admission date:  05/01/2014  Admitting Physician  Etta Quill, DO  Discharge Date:  05/03/2014   Primary MD  [name], MD  Recommendations for primary care physician for things to follow:   Monitor recreational drug abuse, monitor narcotic use. Monitor secondary to his factors for CAD.  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 Hypertension   . Diabetes mellitus without complication   . Hyperlipidemia   . Coronary artery disease   . MI, old   . CAD (coronary artery disease)     STEMI-PCI   . HTN (hypertension)   . Hyperlipemia   . DM2 (diabetes mellitus, type 2)   . Tobacco use   . Obesity     Past Surgical History  Procedure Laterality Date  . Coronary angioplasty with stent placement    . Coronary angioplasty with stent placement  June 2014    LAD DES  . Coronary angioplasty with stent placement  09/06/13     STEMI (LAD ISR)  . Coronary angiogram  09/07/13    residual RCA and OM disease  . Left heart cath Bilateral 07/08/2012    Procedure: LEFT HEART CATH;  Surgeon: [name], MD;  Location: [facility] CATH LAB;  Service: Cardiovascular;  Laterality: Bilateral;  . Percutaneous coronary stent intervention (pci-s)  07/08/2012    Procedure: PERCUTANEOUS  CORONARY STENT INTERVENTION (PCI-S);  Surgeon: [name], MD;  Location: [facility] CATH LAB;  Service: Cardiovascular;;  DES LAD  . Left heart catheterization with coronary angiogram N/A 09/06/2013    Procedure: LEFT HEART CATHETERIZATION WITH CORONARY ANGIOGRAM;  Surgeon: [name], MD;  Location: [facility] CATH LAB;  Service: Cardiovascular;  Laterality: N/A;  . Percutaneous coronary stent intervention (pci-s) N/A 09/06/2013    Procedure: PERCUTANEOUS CORONARY STENT INTERVENTION (PCI-S);  Surgeon: [name], MD;  Location: [facility] CATH LAB;  Service: Cardiovascular;  Laterality: N/A;  Mid LAD 3.0/24mm Promus       History of present illness and  Hospital Course:     Kindly see H&P for history of present illness and admission details, please review complete Labs, Consult reports and Test reports for all details in brief  HPI  from the history and physical done on the day of admission  [name] is a 59 y.o. male -  previous history of coronary artery disease with stenting procedures, diabetic, hypertensive, who now presents with intermittent chest pain  for the last week or so. The pain is similar to cardiac pain he has had before and it last from a few minutes to maybe half an hour. It does not radiate. It is not associated with sweating, nausea, dyspnea. He has had myocardial infarction in August 2015. The patient is known to have diffuse diabetic disease on cardiac catheterization previously. He also describes rectal bleeding, possibly hemorrhoidal in origin and he is compliant with aspirin and Effient. He is now being admitted for further management.  Hospital Course      1. Unstable angina. History of CAD with drug-eluting stent placement 2 last in 2015. Continues to smoke and use cocaine. Chest pain-free with 3 sets of troponin negative, further workup per cardiology will discharge on good home regimen except Lopressor switched to Coreg in the light of cocaine abuse  per cardiology recommendation. Counseled to quit smoking and abuse cocaine. Will follow with PCP and primary cardiologist post discharge.   2. Smoking and cocaine abuse. Counseled to quit    3. Dyslipidemia continue home dose statin.    4. DM type II. resume  home regimen unchanged.   Recent Labs    Lab Results  Component Value Date   HGBA1C 8.5* 07/07/2012      CBG (last 3)   Recent Labs (last 2 labs)      Recent Labs  05/01/14 2238 05/02/14 0734  GLUCAP 197* 159*       5. Essential hypertension continue combination of beta blocker, diuretic and Ace and monitor. Beta blocker switch to Coreg due to cocaine abuse upon cardiology recommendation.   6. Intermittent hemorrhoidal bleed. For several years. Stable no acute issue. CT abdomen pelvis nonacute.   7. Pleural plaques noted on CT abdomen pelvis. Outpatient follow-up with PCP.   8. CK D stage III. Baseline creatinine close to 1.4. Monitor. At baseline.   9. Chr. systolic and diastolic CHF. Last EF 40-45%. Likely has underlying ischemic cardiomyopathy. Currently compensated. Continue home regimen of beta blocker, Ace and diuretic.   10. Dull chronic testicular pain. Scrotal ultrasound and examination nonacute, UA nonacute, will request to follow with urology in 1-2 weeks.    11. 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 Outpatient follow-up with vascular surgeon for monitoring.         Discharge Condition: Stable   Follow UP  Follow-up Information    Follow up with Carlyle Dolly, F, MD. Schedule an appointment as soon as possible for a visit in 1 week.   Specialty:  Cardiology   Contact information:   [place] [address] [phone]       Follow up with [facility].   Why:  Testicular pain   Contact information:   [address] [place] [address] [phone]      Follow up with [name],  MD. Schedule an appointment as soon as possible for a visit in 3 days.   Specialty:  Nurse Practitioner   Contact information:   [place] [address] [phone]       Follow up with Elam Dutch, MD. Schedule an appointment as soon as possible for a visit in 1 week.   Specialty:  Vascular Surgery   Why:  R iliac artery enlargement   Contact information:   [address] [place] [address] [phone]         Discharge Instructions  and  Discharge Medications  Discharge Instructions    Discharge instructions    Complete by:  As directed   Follow with Primary MD in 7 days   Get CBC, CMP, 2 view Chest X ray checked  by Primary MD next visit.    Activity: As tolerated with Full fall precautions use walker/cane & assistance as needed   Disposition Home     Diet: Heart Healthy Low carb.  For Heart failure patients - Check your Weight same time everyday, if you gain over 2 pounds, or you develop in leg swelling, experience more shortness of breath or chest pain, call your Primary MD immediately. Follow Cardiac Low Salt Diet and 1.5 lit/day fluid restriction.   On your next visit with your primary care physician please Get Medicines reviewed and adjusted.   Please request your Prim.MD to go over all Hospital Tests and Procedure/Radiological results at the follow up, please get all Hospital records sent to your Prim MD by signing hospital release before you go home.   If you experience worsening of your admission symptoms, develop shortness of breath, life threatening emergency, suicidal or homicidal thoughts you must seek medical attention immediately by calling 911 or calling your MD immediately  if symptoms less severe.  You Must read complete instructions/literature along with all the possible adverse reactions/side effects for all the Medicines you take and that have been prescribed to you. Take any new Medicines after you have  completely understood and accpet all the possible adverse reactions/side effects.   Do not drive, operating heavy machinery, perform activities at heights, swimming or participation in water activities or provide baby sitting services if your were admitted for syncope or siezures until you have seen by Primary MD or a Neurologist and advised to do so again.  Do not drive when taking Pain medications.    Do not take more than prescribed Pain, Sleep and Anxiety Medications  Special Instructions: If you have smoked or chewed Tobacco  in the last 2 yrs please stop smoking, stop any regular Alcohol  and or any Recreational drug use.  Wear Seat belts while driving.   Please note  You were cared for by a hospitalist during your hospital stay. If you have any questions about your discharge medications or the care you received while you were in the hospital after you are discharged, you can call the unit and asked to speak with the hospitalist on call if the hospitalist that took care of you is not available. Once you are discharged, your primary care physician will handle any further medical issues. Please note that NO REFILLS for any discharge medications will be authorized once you are discharged, as it is imperative that you return to your primary care physician (or establish a relationship with a primary care physician if you do not have one) for your aftercare needs so that they can reassess your need for medications and monitor your lab values.     Increase activity slowly    Complete by:  As directed             Medication List    STOP taking these medications        metoprolol tartrate 25 MG tablet  Commonly known as:  LOPRESSOR      TAKE these medications        acetaminophen 325 MG tablet  Commonly known as:  TYLENOL  Take 2 tablets (650 mg total) by mouth every 4 (four) hours as needed for headache or mild pain.  amLODipine 5 MG tablet  Commonly known as:  NORVASC  Take  1 tablet (5 mg total) by mouth daily.     aspirin EC 81 MG tablet  Take 81 mg by mouth daily.     atorvastatin 80 MG tablet  Commonly known as:  LIPITOR  Take 1 tablet (80 mg total) by mouth daily.     buPROPion 150 MG 12 hr tablet  Commonly known as:  WELLBUTRIN SR  Take 1 tablet (150 mg total) by mouth 2 (two) times daily.     carvedilol 6.25 MG tablet  Commonly known as:  COREG  Take 1 tablet (6.25 mg total) by mouth 2 (two) times daily with a meal.     furosemide 20 MG tablet  Commonly known as:  LASIX  Take 1 tablet (20 mg total) by mouth daily.     HYDROcodone-acetaminophen 5-325 MG per tablet  Commonly known as:  NORCO/VICODIN  Take 1 tablet by mouth every 6 (six) hours as needed for severe pain.     isosorbide mononitrate 30 MG 24 hr tablet  Commonly known as:  IMDUR  Take 1 tablet (30 mg total) by mouth daily.     lisinopril 5 MG tablet  Commonly known as:  PRINIVIL,ZESTRIL  Take 5 mg by mouth daily.     metFORMIN 1000 MG tablet  Commonly known as:  GLUCOPHAGE  Take 1 tablet (1,000 mg total) by mouth 2 (two) times daily with a meal. Do not restart Metformin until Wednesday am 7/2     nicotine 7 mg/24hr patch  Commonly known as:  NICODERM CQ  Place 1 patch (7 mg total) onto the skin daily.     nitroGLYCERIN 0.4 MG SL tablet  Commonly known as:  NITROSTAT  Place 1 tablet (0.4 mg total) under the tongue every 5 (five) minutes x 3 doses as needed for chest pain.     prasugrel 10 MG Tabs tablet  Commonly known as:  EFFIENT  Take 1 tablet (10 mg total) by mouth daily.     ticagrelor 90 MG Tabs tablet  Commonly known as:  BRILINTA  Take 1 tablet (90 mg total) by mouth 2 (two) times daily.          Diet and Activity recommendation: See Discharge Instructions above   Consults obtained - Cards   Major procedures and Radiology Reports - PLEASE review detailed and final reports for all details, in brief -        Ct Abdomen Pelvis Wo  Contrast  05/01/2014   CLINICAL DATA:  Intermittent chest pain for 3 days. Bright red blood as well as dark appearing stools. Nausea and vomiting.  EXAM: CT ABDOMEN AND PELVIS WITHOUT CONTRAST  TECHNIQUE: Multidetector CT imaging of the abdomen and pelvis was performed following the standard protocol without IV contrast.  COMPARISON:  None.  FINDINGS: The lack of intravenous contrast limits the ability to evaluate solid abdominal organs.  Normal hepatic contour. Normal noncontrast appearance of the gallbladder given degree distention. No radiopaque gallstones. No ascites.  Punctate calcification about the right renal hilum is favored to be vascular in etiology (image 38, series 2). No definite renal stones. No renal stones are seen along the expected course of either ureter or the urinary bladder. Normal noncontrast appearance of the urinary bladder given degree distention. No urinary obstruction or perinephric stranding. Normal noncontrast appearance of bilateral adrenal glands, pancreas and spleen.  Small hiatal hernia. Moderate colonic stool burden without evidence of enteric obstruction.  Bowel is otherwise normal in course and caliber without discrete area of wall thickening. The appendix is not visualized, however there is no pericecal inflammatory change. No pneumoperitoneum, pneumatosis or portal venous gas.  Scattered atherosclerotic plaque within a normal caliber abdominal aorta. There is mild fusiform ectasia of the left common iliac artery measuring approximately 2.3 cm in diameter (coronal image 60, series 3).  Shotty bilateral inguinal lymph nodes are individually not enlarged by size criteria with index right-sided inguinal lymph node measuring 0.7 cm in greatest short axis diameter (image 83, series 2), presumably reactive in etiology. No bulky retroperitoneal, mesenteric, pelvic or inguinal lymphadenopathy on this noncontrast examination.  Scattered calcifications within a normal sized prostate  gland. No free fluid within the pelvic cul-de-sac.  Limited visualization of the lower thorax demonstrates mild slightly asymmetric and nodular pleural parenchymal thickening (representative images 2 and 10, series 6). No associated pleural calcifications or evidence of fibrotic change. This finding is associated with asymmetric subpleural ground-glass atelectasis within the imaged caudal aspects of the right lower and middle lobes. No focal airspace opacities. No pleural effusion.  Normal heart size.  No pericardial effusion.  No acute or aggressive osseous abnormalities. Moderate DDD of L5-S1 with disc space height loss, endplate irregularity and sclerosis. Incidental note is made of a small right sided os acetabula.  Regional soft tissues appear normal.  IMPRESSION: 1. No explanation for patient's bright red and dark appearing stools. Specifically, no evidence of enteric obstruction. 2. No evidence of nephrolithiasis urinary obstruction. 3. Asymmetric mild slightly nodular pleural parenchymal thickening involving the imaged right lower thorax without associated pleural calcifications. Correlation for history of asbestos exposure is recommended. 4. Small hiatal hernia. 5. Scattered atherosclerotic plaque within a normal caliber abdominal aorta. Mild fusiform ectasia of the right common iliac artery measuring 2.3 cm in diameter   Electronically Signed   By: Sandi Mariscal M.D.   On: 05/01/2014 18:55   Dg Chest 2 View  05/01/2014   CLINICAL DATA:  Chest pain and shortness of breath. Productive cough.  EXAM: CHEST  2 VIEW  COMPARISON:  11/29/2012 and 11/21/2009  FINDINGS: Heart size and pulmonary vascularity are normal. The lungs are somewhat hyperinflated suggesting emphysema. There is chronic peribronchial thickening and interstitial accentuation in the right mid and lower lung zone. No effusions. No acute osseous abnormality.  IMPRESSION: Chronic bronchitic changes. COPD. Slight chronic interstitial disease.    Electronically Signed   By: Lorriane Shire M.D.   On: 05/01/2014 18:19   US Scrotum  05/02/2014   CLINICAL DATA:  Bilateral scrotal pain for 1 week.  EXAM: SCROTAL ULTRASOUND  DOPPLER ULTRASOUND OF THE TESTICLES  TECHNIQUE: Complete ultrasound examination of the testicles, epididymis, and other scrotal structures was performed. Color and spectral Doppler ultrasound were also utilized to evaluate blood flow to the testicles.  COMPARISON:  None.  FINDINGS: Right testicle  Measurements: 3.7 x 3.2 x 1.6 cm. No mass or microlithiasis visualized.  Left testicle  Measurements: 3.7 x 3.1 x 1.9 cm. No mass or microlithiasis visualized.  Right epididymis:  Normal in size and appearance.  Left epididymis:  1 cm cyst is noted.  Hydrocele:  Mild bilateral hydroceles are noted.  Varicocele:  None visualized.  Pulsed Doppler interrogation of both testes demonstrates normal low resistance arterial and venous waveforms bilaterally.  IMPRESSION: No evidence of testicular mass or torsion. 1 cm left epididymal cyst is noted. Mild bilateral hydroceles are noted.   Electronically Signed   By: Sabino Dick  Brooke Bonito, M.D.   On: 05/02/2014 16:45    Micro Results      No results found for this or any previous visit (from the past 240 hour(s)).     Today   Subjective:   [name] today has no headache,no chest abdominal pain,no new weakness tingling or numbness, feels much better wants to go home today.  Complains of some dull chronic testicular ache for the last 2-3 weeks. Had nausea last night which has resolved. Currently feels great.  Objective:   Blood pressure 103/65, pulse 100, temperature 97.4 F (36.3 C), temperature source Oral, resp. rate 18, height 5\' 9"  (1.753 m), weight 113.036 kg (249 lb 3.2 oz), SpO2 98 %.   Intake/Output Summary (Last 24 hours) at 05/03/14 1109 Last data filed at 05/03/14 0856  Gross per 24 hour  Intake    420 ml  Output      0 ml  Net    420 ml    Exam Awake Alert, Oriented  x 3, No new F.N deficits, Normal affect [place].AT,PERRAL Supple Neck,No JVD, No cervical lymphadenopathy appriciated.  Symmetrical Chest wall movement, Good air movement bilaterally, CTAB RRR,No Gallops,Rubs or new Murmurs, No Parasternal Heave +ve B.Sounds, Abd Soft, Non tender, No organomegaly appriciated, No rebound -guarding or rigidity. No Cyanosis, Clubbing or edema, No new Rash or bruise  Data Review   CBC w Diff:  Lab Results  Component Value Date   WBC 9.8 05/02/2014   HGB 13.4 05/02/2014   HCT 41.0 05/02/2014   PLT 225 05/02/2014   LYMPHOPCT 22 05/01/2014   MONOPCT 4 05/01/2014   EOSPCT 7* 05/01/2014   BASOPCT 1 05/01/2014    CMP:  Lab Results  Component Value Date   NA 138 05/02/2014   K 4.0 05/02/2014   CL 106 05/02/2014   CO2 24 05/02/2014   BUN 18 05/02/2014   CREATININE 1.29 05/02/2014   PROT 6.1 05/02/2014   ALBUMIN 3.1* 05/02/2014   BILITOT 0.4 05/02/2014   ALKPHOS 81 05/02/2014   AST 14 05/02/2014   ALT 17 05/02/2014  .   Total Time in preparing paper work, data evaluation and todays exam - 35 minutes  Thurnell Lose M.D on 05/03/2014 at 11:09 AM  [facility]   Office  [phone]

## 2014-05-03 NOTE — Progress Notes (Signed)
Subjective:  Multiple complaints today.  He complains mainly of pain in his testicle, low back pain, and abdominal pain.  Some left pain in his side but could not get a definite history of ischemic type chest pain.  Vague epigastric pain.  Troponins were negative.  Evidently used cocaine within the past couple of weeks.  Has diffuse diabetic coronary artery disease.  Objective:  Vital Signs in the last 24 hours: BP 103/65 mmHg  Pulse 100  Temp(Src) 97.4 F (36.3 C) (Oral)  Resp 18  Ht 5\' 9"  (1.753 m)  Wt 113.036 kg (249 lb 3.2 oz)  BMI 36.78 kg/m2  SpO2 98%  Physical Exam: Obese black male currently in no acute distress, complaining of some back pain. Lungs:  Clear Cardiac:  Regular rhythm, normal S1 and S2, no S3 Abdomen:  Soft, nontender, no masses Extremities:  No edema present  Intake/Output from previous day: 04/22 0701 - 04/23 0700 In: 500 [P.O.:500] Out: -   Weight Filed Weights   05/01/14 1604 05/02/14 0424 05/03/14 0658  Weight: 112.946 kg (249 lb) 117.209 kg (258 lb 6.4 oz) 113.036 kg (249 lb 3.2 oz)    Lab Results: Basic Metabolic Panel:  Recent Labs  05/01/14 1638 05/02/14 0650  NA 137 138  K 3.6 4.0  CL 102 106  CO2 26 24  GLUCOSE 192* 169*  BUN 21 18  CREATININE 1.39* 1.29   CBC:  Recent Labs  05/01/14 1638 05/02/14 0650  WBC 10.2 9.8  NEUTROABS 6.8  --   HGB 14.0 13.4  HCT 43.1 41.0  MCV 80.0 78.7  PLT 237 225   Cardiac Panel (last 3 results)  Recent Labs  05/01/14 1638 05/01/14 1938 05/02/14 0650  TROPONINI <0.03 <0.03 <0.03    Telemetry: Normal sinus rhythm  Assessment/Plan:  1.  Coronary artery disease with severe diffuse diabetic disease 2.  Stage III chronic kidney disease 3.  Hypertension better controlled today 4.  Chest pain sounds noncardiac to me by description and has multiple other complaints  Recommendations:  With troponins negative, continue current medication and have ambulate in hall to see if recurrent  pain.  Address other multiple noncardiac complaints.     Kerry Hough  MD [facility]  05/03/2014, 10:09 AM

## 2014-05-03 NOTE — Discharge Instructions (Signed)
Follow with Primary MD in 7 days   Get CBC, CMP, 2 view Chest X ray checked  by Primary MD next visit.    Activity: As tolerated with Full fall precautions use walker/cane & assistance as needed   Disposition Home     Diet: Heart Healthy Low carb.  For Heart failure patients - Check your Weight same time everyday, if you gain over 2 pounds, or you develop in leg swelling, experience more shortness of breath or chest pain, call your Primary MD immediately. Follow Cardiac Low Salt Diet and 1.5 lit/day fluid restriction.   On your next visit with your primary care physician please Get Medicines reviewed and adjusted.   Please request your Prim.MD to go over all Hospital Tests and Procedure/Radiological results at the follow up, please get all Hospital records sent to your Prim MD by signing hospital release before you go home.   If you experience worsening of your admission symptoms, develop shortness of breath, life threatening emergency, suicidal or homicidal thoughts you must seek medical attention immediately by calling 911 or calling your MD immediately  if symptoms less severe.  You Must read complete instructions/literature along with all the possible adverse reactions/side effects for all the Medicines you take and that have been prescribed to you. Take any new Medicines after you have completely understood and accpet all the possible adverse reactions/side effects.   Do not drive, operating heavy machinery, perform activities at heights, swimming or participation in water activities or provide baby sitting services if your were admitted for syncope or siezures until you have seen by Primary MD or a Neurologist and advised to do so again.  Do not drive when taking Pain medications.    Do not take more than prescribed Pain, Sleep and Anxiety Medications  Special Instructions: If you have smoked or chewed Tobacco  in the last 2 yrs please stop smoking, stop any regular Alcohol   and or any Recreational drug use.  Wear Seat belts while driving.   Please note  You were cared for by a hospitalist during your hospital stay. If you have any questions about your discharge medications or the care you received while you were in the hospital after you are discharged, you can call the unit and asked to speak with the hospitalist on call if the hospitalist that took care of you is not available. Once you are discharged, your primary care physician will handle any further medical issues. Please note that NO REFILLS for any discharge medications will be authorized once you are discharged, as it is imperative that you return to your primary care physician (or establish a relationship with a primary care physician if you do not have one) for your aftercare needs so that they can reassess your need for medications and monitor your lab values.

## 2014-05-04 LAB — URINE CULTURE
Colony Count: NO GROWTH
Culture: NO GROWTH

## 2014-05-05 LAB — GC/CHLAMYDIA PROBE AMP (~~LOC~~) NOT AT ARMC
CHLAMYDIA, DNA PROBE: NEGATIVE
NEISSERIA GONORRHEA: NEGATIVE

## 2014-06-15 ENCOUNTER — Emergency Department (HOSPITAL_COMMUNITY)
Admission: EM | Admit: 2014-06-15 | Discharge: 2014-06-15 | Disposition: A | Discharge status: Home / Self Care | Payer: Medicare Other | Attending: Emergency Medicine | Admitting: Emergency Medicine

## 2014-06-15 ENCOUNTER — Encounter (HOSPITAL_COMMUNITY): Payer: Self-pay | Admitting: Emergency Medicine

## 2014-06-15 DIAGNOSIS — E669 Obesity, unspecified: Secondary | ICD-10-CM | POA: Insufficient documentation

## 2014-06-15 DIAGNOSIS — M545 Low back pain: Secondary | ICD-10-CM | POA: Diagnosis not present

## 2014-06-15 DIAGNOSIS — E119 Type 2 diabetes mellitus without complications: Secondary | ICD-10-CM | POA: Insufficient documentation

## 2014-06-15 DIAGNOSIS — G8929 Other chronic pain: Secondary | ICD-10-CM | POA: Insufficient documentation

## 2014-06-15 DIAGNOSIS — I2510 Atherosclerotic heart disease of native coronary artery without angina pectoris: Secondary | ICD-10-CM | POA: Diagnosis not present

## 2014-06-15 DIAGNOSIS — M549 Dorsalgia, unspecified: Secondary | ICD-10-CM

## 2014-06-15 DIAGNOSIS — I10 Essential (primary) hypertension: Secondary | ICD-10-CM | POA: Insufficient documentation

## 2014-06-15 DIAGNOSIS — Z9861 Coronary angioplasty status: Secondary | ICD-10-CM | POA: Insufficient documentation

## 2014-06-15 DIAGNOSIS — Z79899 Other long term (current) drug therapy: Secondary | ICD-10-CM | POA: Diagnosis not present

## 2014-06-15 DIAGNOSIS — Z791 Long term (current) use of non-steroidal anti-inflammatories (NSAID): Secondary | ICD-10-CM | POA: Diagnosis not present

## 2014-06-15 DIAGNOSIS — Z720 Tobacco use: Secondary | ICD-10-CM | POA: Insufficient documentation

## 2014-06-15 DIAGNOSIS — I252 Old myocardial infarction: Secondary | ICD-10-CM | POA: Insufficient documentation

## 2014-06-15 DIAGNOSIS — Z7982 Long term (current) use of aspirin: Secondary | ICD-10-CM | POA: Insufficient documentation

## 2014-06-15 DIAGNOSIS — M25562 Pain in left knee: Secondary | ICD-10-CM | POA: Diagnosis not present

## 2014-06-15 MED ORDER — METHOCARBAMOL 750 MG PO TABS: 750.0000 mg | ORAL_TABLET | Freq: Four times a day (QID) | ORAL | Status: DC | PRN

## 2014-06-15 MED ORDER — MELOXICAM 7.5 MG PO TABS: 7.5000 mg | ORAL_TABLET | Freq: Every day | ORAL | Status: DC

## 2014-06-15 NOTE — ED Provider Notes (Signed)
CSN: [id number]     Arrival date & time 06/15/14  0930 History   First MD Initiated Contact with Patient 06/15/14 0930     Chief Complaint  Patient presents with  . Back Pain     (Consider location/radiation/quality/duration/timing/severity/associated sxs/prior Treatment) The history is provided by the patient and medical records.     Patient with hx chronic back pain and chronic left knee pain presents with worsening of chronic symptoms. States he has had low back pain for years.  The pain is constant, located in his lower back, feels "like someone beat me with a 2x4," does not radiate.  It is exacerbated by movement.  He also has pain in his left knee that intermittently flares up since his left knee surgery in the 1980s.  Denies any change in the location or character of his chronic pain.  Denies any falls or recent injury. Denies heavy lifting.  Notes that it just flares up like this from time to time.  Denies fevers, chills, abdominal pain, loss of control of bowel or bladder, weakness of numbness of the extremities, saddle anesthesia, bowel or urinary complaints.  Denies hx CA, IVDU.    Past Medical History  Diagnosis Date  . Hypertension   . Diabetes mellitus without complication   . Hyperlipidemia   . Coronary artery disease   . MI, old   . CAD (coronary artery disease)     STEMI-PCI   . HTN (hypertension)   . Hyperlipemia   . DM2 (diabetes mellitus, type 2)   . Tobacco use   . Obesity    Past Surgical History  Procedure Laterality Date  . Coronary angioplasty with stent placement    . Coronary angioplasty with stent placement  June 2014    LAD DES  . Coronary angioplasty with stent placement  09/06/13     STEMI (LAD ISR)  . Coronary angiogram  09/07/13    residual RCA and OM disease  . Left heart cath Bilateral 07/08/2012    Procedure: LEFT HEART CATH;  Surgeon: [name], MD;  Location: [facility] CATH LAB;  Service: Cardiovascular;  Laterality: Bilateral;  .  Percutaneous coronary stent intervention (pci-s)  07/08/2012    Procedure: PERCUTANEOUS CORONARY STENT INTERVENTION (PCI-S);  Surgeon: [name], MD;  Location: [facility] CATH LAB;  Service: Cardiovascular;;  DES LAD  . Left heart catheterization with coronary angiogram N/A 09/06/2013    Procedure: LEFT HEART CATHETERIZATION WITH CORONARY ANGIOGRAM;  Surgeon: [name], MD;  Location: [facility] CATH LAB;  Service: Cardiovascular;  Laterality: N/A;  . Percutaneous coronary stent intervention (pci-s) N/A 09/06/2013    Procedure: PERCUTANEOUS CORONARY STENT INTERVENTION (PCI-S);  Surgeon: [name], MD;  Location: [facility] CATH LAB;  Service: Cardiovascular;  Laterality: N/A;  Mid LAD 3.0/24mm Promus   History reviewed. No pertinent family history. History  Substance Use Topics  . Smoking status: Current Every Day Smoker    Types: Cigarettes  . Smokeless tobacco: Not on file  . Alcohol Use: Yes     Comment: occ    Review of Systems  Genitourinary: Positive for testicular pain (unchanged x months, 05/01/13 [place] negative).  All other systems reviewed and are negative.     Allergies  Review of patient's allergies indicates no known allergies.  Home Medications   Prior to Admission medications   Medication Sig Start Date End Date Taking? Authorizing Provider  acetaminophen (TYLENOL) 325 MG tablet Take 2 tablets (650 mg total) by mouth every 4 (  four) hours as needed for headache or mild pain. 09/09/13   [name], PA-C  amLODipine (NORVASC) 5 MG tablet Take 1 tablet (5 mg total) by mouth daily. Patient not taking: Reported on 05/01/2014 07/17/12   [name], MD  aspirin EC 81 MG tablet Take 81 mg by mouth daily.     Historical Provider, MD  atorvastatin (LIPITOR) 80 MG tablet Take 1 tablet (80 mg total) by mouth daily. 07/10/12   [name], MD  buPROPion (WELLBUTRIN SR) 150 MG 12 hr tablet Take 1 tablet (150 mg total) by mouth 2 (two) times daily. Patient not taking: Reported  on 05/01/2014 07/10/12   [name], MD  carvedilol (COREG) 6.25 MG tablet Take 1 tablet (6.25 mg total) by mouth 2 (two) times daily with a meal. 05/03/14   Thurnell Lose, MD  furosemide (LASIX) 20 MG tablet Take 1 tablet (20 mg total) by mouth daily. 09/09/13   [name], PA-C  HYDROcodone-acetaminophen (NORCO/VICODIN) 5-325 MG per tablet Take 1 tablet by mouth every 6 (six) hours as needed for severe pain. Patient not taking: Reported on 05/01/2014 11/29/12   Carmin Muskrat, MD  isosorbide mononitrate (IMDUR) 30 MG 24 hr tablet Take 1 tablet (30 mg total) by mouth daily. 09/09/13   [name], PA-C  lisinopril (PRINIVIL,ZESTRIL) 5 MG tablet Take 5 mg by mouth daily.    Historical Provider, MD  meloxicam (MOBIC) 7.5 MG tablet Take 1 tablet (7.5 mg total) by mouth daily. 06/15/14   Clayton Bibles, PA-C  metFORMIN (GLUCOPHAGE) 1000 MG tablet Take 1 tablet (1,000 mg total) by mouth 2 (two) times daily with a meal. Do not restart Metformin until Wednesday am 7/2 Patient taking differently: Take 1,000 mg by mouth daily.  07/10/12   [name], MD  methocarbamol (ROBAXIN) 750 MG tablet Take 1 tablet (750 mg total) by mouth every 6 (six) hours as needed for muscle spasms (or pain). 06/15/14   Clayton Bibles, PA-C  nicotine (NICODERM CQ) 7 mg/24hr patch Place 1 patch (7 mg total) onto the skin daily. 05/03/14   Thurnell Lose, MD  nitroGLYCERIN (NITROSTAT) 0.4 MG SL tablet Place 1 tablet (0.4 mg total) under the tongue every 5 (five) minutes x 3 doses as needed for chest pain. 05/03/14   Thurnell Lose, MD  prasugrel (EFFIENT) 10 MG TABS tablet Take 1 tablet (10 mg total) by mouth daily. 09/09/13   [name], PA-C  Ticagrelor (BRILINTA) 90 MG TABS tablet Take 1 tablet (90 mg total) by mouth 2 (two) times daily. Patient not taking: Reported on 05/01/2014 07/10/12   [name], MD   BP 115/83 mmHg  Pulse 69  Temp(Src) 97.7 F (36.5 C) (Oral)  Resp 18  Ht 5\' 9"  (1.753 m)  Wt 250 lb (113.399 kg)   BMI 36.90 kg/m2  SpO2 100% Physical Exam  Constitutional: He appears well-developed and well-nourished. No distress.  HENT:  Head: Normocephalic and atraumatic.  Neck: Neck supple.  Pulmonary/Chest: Effort normal.  Abdominal: Soft. He exhibits no distension. There is no tenderness. There is no rebound and no guarding.  Musculoskeletal:  Spine nontender, no crepitus, or stepoffs. Lower extremities:  Strength 5/5, sensation intact, distal pulses intact.    Left knee with mild diffuse anterior tenderness.  No erythema, edema, warmth, effusion.    Neurological: He is alert.  Skin: He is not diaphoretic.  Nursing note and vitals reviewed.   ED Course  Procedures (  including critical care time) Labs Review Labs Reviewed - No data to display  Imaging Review No results found.   EKG Interpretation None      MDM   Final diagnoses:  Chronic back pain  Chronic knee pain, left    Afebrile, nontoxic patient with exacerbation of chronic back and left knee pain.  No red flags with history or exam.  Neurovascularly intact. No recent fall or injury. Emergent imaging not indicated at this time.  CT scan of abd/pelvis performed 04/2014 showed no concerning bony lesions of the spine, did show DDD L5-S1 with disc space height loss, end plate irregularity and sclerosis.  [facility]  D/C home with short course mobic, robaxin, [facility].  Discussed result, findings, treatment, and follow up  with patient.  Pt given return precautions.  Pt verbalizes understanding and agrees with plan.          Clayton Bibles, PA-C 06/15/14 [place], MD 06/15/14 [phone]

## 2014-06-15 NOTE — ED Notes (Signed)
PA at bedside.

## 2014-06-15 NOTE — Discharge Instructions (Signed)
Read the information below.  Use the prescribed medication as directed.  Please discuss all new medications with your pharmacist.  You may return to the Emergency Department at any time for worsening condition or any new symptoms that concern you.   If you develop fevers, loss of control of bowel or bladder, weakness or numbness in your legs, or are unable to walk, return to the ER for a recheck.     Back Pain, Adult Low back pain is very common. About 1 in 5 people have back pain.The cause of low back pain is rarely dangerous. The pain often gets better over time.About half of people with a sudden onset of back pain feel better in just 2 weeks. About 8 in 10 people feel better by 6 weeks.  CAUSES Some common causes of back pain include:  Strain of the muscles or ligaments supporting the spine.  Wear and tear (degeneration) of the spinal discs.  Arthritis.  Direct injury to the back. DIAGNOSIS Most of the time, the direct cause of low back pain is not known.However, back pain can be treated effectively even when the exact cause of the pain is unknown.Answering your caregiver's questions about your overall health and symptoms is one of the most accurate ways to make sure the cause of your pain is not dangerous. If your caregiver needs more information, he or she may order lab work or imaging tests (X-rays or MRIs).However, even if imaging tests show changes in your back, this usually does not require surgery. HOME CARE INSTRUCTIONS For many people, back pain returns.Since low back pain is rarely dangerous, it is often a condition that people can learn to [facility] their own.   Remain active. It is stressful on the back to sit or stand in one place. Do not sit, drive, or stand in one place for more than 30 minutes at a time. Take short walks on level surfaces as soon as pain allows.Try to increase the length of time you walk each day.  Do not stay in bed.Resting more than 1 or 2 days can  delay your recovery.  Do not avoid exercise or work.Your body is made to move.It is not dangerous to be active, even though your back may hurt.Your back will likely heal faster if you return to being active before your pain is gone.  Pay attention to your body when you bend and lift. Many people have less discomfortwhen lifting if they bend their knees, keep the load close to their bodies,and avoid twisting. Often, the most comfortable positions are those that put less stress on your recovering back.  Find a comfortable position to sleep. Use a firm mattress and lie on your side with your knees slightly bent. If you lie on your back, put a pillow under your knees.  Only take over-the-counter or prescription medicines as directed by your caregiver. Over-the-counter medicines to reduce pain and inflammation are often the most helpful.Your caregiver may prescribe muscle relaxant drugs.These medicines help dull your pain so you can more quickly return to your normal activities and healthy exercise.  Put ice on the injured area.  Put ice in a plastic bag.  Place a towel between your skin and the bag.  Leave the ice on for 15-20 minutes, 03-04 times a day for the first 2 to 3 days. After that, ice and heat may be alternated to reduce pain and spasms.  Ask your caregiver about trying back exercises and gentle massage. This may be of  some benefit.  Avoid feeling anxious or stressed.Stress increases muscle tension and can worsen back pain.It is important to recognize when you are anxious or stressed and learn ways to manage it.Exercise is a great option. SEEK MEDICAL CARE IF:  You have pain that is not relieved with rest or medicine.  You have pain that does not improve in 1 week.  You have new symptoms.  You are generally not feeling well. SEEK IMMEDIATE MEDICAL CARE IF:   You have pain that radiates from your back into your legs.  You develop new bowel or bladder control  problems.  You have unusual weakness or numbness in your arms or legs.  You develop nausea or vomiting.  You develop abdominal pain.  You feel faint. Document Released: 12/27/2004 Document Revised: 06/28/2011 Document Reviewed: 04/30/2013 [facility] Patient Information 2015 [place], [place]. This information is not intended to replace advice given to you by your health care provider. Make sure you discuss any questions you have with your health care provider.  Chronic Pain Chronic pain can be defined as pain that is off and on and lasts for 3-6 months or longer. Many things cause chronic pain, which can make it difficult to make a diagnosis. There are many treatment options available for chronic pain. However, finding a treatment that works well for you may require trying various approaches until the right one is found. Many people benefit from a combination of two or more types of treatment to control their pain. SYMPTOMS  Chronic pain can occur anywhere in the body and can range from mild to very severe. Some types of chronic pain include:  Headache.  Low back pain.  Cancer pain.  Arthritis pain.  Neurogenic pain. This is pain resulting from damage to nerves. People with chronic pain may also have other symptoms such as:  Depression.  Anger.  Insomnia.  Anxiety. DIAGNOSIS  Your health care provider will help diagnose your condition over time. In many cases, the initial focus will be on excluding possible conditions that could be causing the pain. Depending on your symptoms, your health care provider may order tests to diagnose your condition. Some of these tests may include:   Blood tests.   CT scan.   MRI.   X-rays.   Ultrasounds.   Nerve conduction studies.  You may need to see a specialist.  TREATMENT  Finding treatment that works well may take time. You may be referred to a pain specialist. He or she may prescribe medicine or therapies, such as:    Mindful meditation or yoga.  Shots (injections) of numbing or pain-relieving medicines into the spine or area of pain.  Local electrical stimulation.  Acupuncture.   Massage therapy.   Aroma, color, light, or sound therapy.   Biofeedback.   Working with a physical therapist to keep from getting stiff.   Regular, gentle exercise.   Cognitive or behavioral therapy.   Group support.  Sometimes, surgery may be recommended.  HOME CARE INSTRUCTIONS   Take all medicines as directed by your health care provider.   Lessen stress in your life by relaxing and doing things such as listening to calming music.   Exercise or be active as directed by your health care provider.   Eat a healthy diet and include things such as vegetables, fruits, fish, and lean meats in your diet.   Keep all follow-up appointments with your health care provider.   Attend a support group with others suffering from chronic pain. SEEK  MEDICAL CARE IF:   Your pain gets worse.   You develop a new pain that was not there before.   You cannot tolerate medicines given to you by your health care provider.   You have new symptoms since your last visit with your health care provider.  SEEK IMMEDIATE MEDICAL CARE IF:   You feel weak.   You have decreased sensation or numbness.   You lose control of bowel or bladder function.   Your pain suddenly gets much worse.   You develop shaking.  You develop chills.  You develop confusion.  You develop chest pain.  You develop shortness of breath.  MAKE SURE YOU:  Understand these instructions.  Will watch your condition.  Will get help right away if you are not doing well or get worse. Document Released: 09/18/2001 Document Revised: 08/29/2012 Document Reviewed: 06/22/2012 [facility] Patient Information 2015 [place], [place]. This information is not intended to replace advice given to you by your health care provider. Make sure  you discuss any questions you have with your health care provider.

## 2014-06-15 NOTE — ED Notes (Signed)
Pt states that he has been having low back pain and left knee pain for years now but 3 days ago it started hurting worse than usual.

## 2014-07-21 DIAGNOSIS — M9903 Segmental and somatic dysfunction of lumbar region: Secondary | ICD-10-CM | POA: Diagnosis not present

## 2014-07-21 DIAGNOSIS — M9905 Segmental and somatic dysfunction of pelvic region: Secondary | ICD-10-CM | POA: Diagnosis not present

## 2014-07-21 DIAGNOSIS — M9904 Segmental and somatic dysfunction of sacral region: Secondary | ICD-10-CM | POA: Diagnosis not present

## 2014-07-21 DIAGNOSIS — M5136 Other intervertebral disc degeneration, lumbar region: Secondary | ICD-10-CM | POA: Diagnosis not present

## 2014-07-24 DIAGNOSIS — M9905 Segmental and somatic dysfunction of pelvic region: Secondary | ICD-10-CM | POA: Diagnosis not present

## 2014-07-24 DIAGNOSIS — M9903 Segmental and somatic dysfunction of lumbar region: Secondary | ICD-10-CM | POA: Diagnosis not present

## 2014-07-24 DIAGNOSIS — M5136 Other intervertebral disc degeneration, lumbar region: Secondary | ICD-10-CM | POA: Diagnosis not present

## 2014-07-24 DIAGNOSIS — M9904 Segmental and somatic dysfunction of sacral region: Secondary | ICD-10-CM | POA: Diagnosis not present

## 2014-07-29 DIAGNOSIS — M9905 Segmental and somatic dysfunction of pelvic region: Secondary | ICD-10-CM | POA: Diagnosis not present

## 2014-07-29 DIAGNOSIS — M9904 Segmental and somatic dysfunction of sacral region: Secondary | ICD-10-CM | POA: Diagnosis not present

## 2014-07-29 DIAGNOSIS — M5136 Other intervertebral disc degeneration, lumbar region: Secondary | ICD-10-CM | POA: Diagnosis not present

## 2014-07-29 DIAGNOSIS — M9903 Segmental and somatic dysfunction of lumbar region: Secondary | ICD-10-CM | POA: Diagnosis not present

## 2014-08-07 DIAGNOSIS — M9903 Segmental and somatic dysfunction of lumbar region: Secondary | ICD-10-CM | POA: Diagnosis not present

## 2014-08-07 DIAGNOSIS — M9905 Segmental and somatic dysfunction of pelvic region: Secondary | ICD-10-CM | POA: Diagnosis not present

## 2014-08-07 DIAGNOSIS — M9904 Segmental and somatic dysfunction of sacral region: Secondary | ICD-10-CM | POA: Diagnosis not present

## 2014-08-07 DIAGNOSIS — M5136 Other intervertebral disc degeneration, lumbar region: Secondary | ICD-10-CM | POA: Diagnosis not present

## 2014-08-19 DIAGNOSIS — M9903 Segmental and somatic dysfunction of lumbar region: Secondary | ICD-10-CM | POA: Diagnosis not present

## 2014-08-19 DIAGNOSIS — M9904 Segmental and somatic dysfunction of sacral region: Secondary | ICD-10-CM | POA: Diagnosis not present

## 2014-08-19 DIAGNOSIS — M5136 Other intervertebral disc degeneration, lumbar region: Secondary | ICD-10-CM | POA: Diagnosis not present

## 2014-08-19 DIAGNOSIS — M9905 Segmental and somatic dysfunction of pelvic region: Secondary | ICD-10-CM | POA: Diagnosis not present

## 2014-09-18 DIAGNOSIS — M9904 Segmental and somatic dysfunction of sacral region: Secondary | ICD-10-CM | POA: Diagnosis not present

## 2014-09-18 DIAGNOSIS — M9903 Segmental and somatic dysfunction of lumbar region: Secondary | ICD-10-CM | POA: Diagnosis not present

## 2014-09-18 DIAGNOSIS — M5136 Other intervertebral disc degeneration, lumbar region: Secondary | ICD-10-CM | POA: Diagnosis not present

## 2014-09-18 DIAGNOSIS — M9905 Segmental and somatic dysfunction of pelvic region: Secondary | ICD-10-CM | POA: Diagnosis not present

## 2014-09-25 ENCOUNTER — Emergency Department (HOSPITAL_COMMUNITY): Payer: Medicare Other

## 2014-09-25 ENCOUNTER — Emergency Department (HOSPITAL_COMMUNITY)
Admission: EM | Admit: 2014-09-25 | Discharge: 2014-09-25 | Disposition: A | Discharge status: Home / Self Care | Payer: Medicare Other | Attending: Emergency Medicine | Admitting: Emergency Medicine

## 2014-09-25 ENCOUNTER — Encounter (HOSPITAL_COMMUNITY): Payer: Self-pay | Admitting: *Deleted

## 2014-09-25 DIAGNOSIS — I2510 Atherosclerotic heart disease of native coronary artery without angina pectoris: Secondary | ICD-10-CM | POA: Diagnosis not present

## 2014-09-25 DIAGNOSIS — Z79899 Other long term (current) drug therapy: Secondary | ICD-10-CM | POA: Diagnosis not present

## 2014-09-25 DIAGNOSIS — J209 Acute bronchitis, unspecified: Secondary | ICD-10-CM | POA: Diagnosis not present

## 2014-09-25 DIAGNOSIS — F17210 Nicotine dependence, cigarettes, uncomplicated: Secondary | ICD-10-CM | POA: Diagnosis not present

## 2014-09-25 DIAGNOSIS — J40 Bronchitis, not specified as acute or chronic: Secondary | ICD-10-CM | POA: Diagnosis not present

## 2014-09-25 DIAGNOSIS — E669 Obesity, unspecified: Secondary | ICD-10-CM | POA: Insufficient documentation

## 2014-09-25 DIAGNOSIS — E119 Type 2 diabetes mellitus without complications: Secondary | ICD-10-CM | POA: Insufficient documentation

## 2014-09-25 DIAGNOSIS — Z791 Long term (current) use of non-steroidal anti-inflammatories (NSAID): Secondary | ICD-10-CM | POA: Insufficient documentation

## 2014-09-25 DIAGNOSIS — E785 Hyperlipidemia, unspecified: Secondary | ICD-10-CM | POA: Insufficient documentation

## 2014-09-25 DIAGNOSIS — N289 Disorder of kidney and ureter, unspecified: Secondary | ICD-10-CM | POA: Diagnosis not present

## 2014-09-25 DIAGNOSIS — Z9861 Coronary angioplasty status: Secondary | ICD-10-CM | POA: Diagnosis not present

## 2014-09-25 DIAGNOSIS — Z7982 Long term (current) use of aspirin: Secondary | ICD-10-CM | POA: Diagnosis not present

## 2014-09-25 DIAGNOSIS — Z7902 Long term (current) use of antithrombotics/antiplatelets: Secondary | ICD-10-CM | POA: Insufficient documentation

## 2014-09-25 DIAGNOSIS — Z9889 Other specified postprocedural states: Secondary | ICD-10-CM | POA: Diagnosis not present

## 2014-09-25 DIAGNOSIS — Z720 Tobacco use: Secondary | ICD-10-CM | POA: Diagnosis not present

## 2014-09-25 DIAGNOSIS — Z794 Long term (current) use of insulin: Secondary | ICD-10-CM | POA: Insufficient documentation

## 2014-09-25 DIAGNOSIS — R0602 Shortness of breath: Secondary | ICD-10-CM | POA: Diagnosis not present

## 2014-09-25 DIAGNOSIS — I252 Old myocardial infarction: Secondary | ICD-10-CM | POA: Diagnosis not present

## 2014-09-25 DIAGNOSIS — I10 Essential (primary) hypertension: Secondary | ICD-10-CM | POA: Insufficient documentation

## 2014-09-25 DIAGNOSIS — R079 Chest pain, unspecified: Secondary | ICD-10-CM | POA: Diagnosis not present

## 2014-09-25 DIAGNOSIS — R05 Cough: Secondary | ICD-10-CM | POA: Diagnosis not present

## 2014-09-25 LAB — COMPREHENSIVE METABOLIC PANEL
ALBUMIN: 4 g/dL (ref 3.5–5.0)
ALK PHOS: 105 U/L (ref 38–126)
ALT: 22 U/L (ref 17–63)
AST: 17 U/L (ref 15–41)
Anion gap: 12 (ref 5–15)
BILIRUBIN TOTAL: 0.4 mg/dL (ref 0.3–1.2)
BUN: 23 mg/dL — AB (ref 6–20)
CALCIUM: 9.1 mg/dL (ref 8.9–10.3)
CO2: 25 mmol/L (ref 22–32)
Chloride: 97 mmol/L — ABNORMAL LOW (ref 101–111)
Creatinine, Ser: 1.6 mg/dL — ABNORMAL HIGH (ref 0.61–1.24)
GFR calc Af Amer: 52 mL/min — ABNORMAL LOW (ref >60)
GFR calc non Af Amer: 45 mL/min — ABNORMAL LOW (ref >60)
GLUCOSE: 200 mg/dL — AB (ref 65–99)
POTASSIUM: 4.1 mmol/L (ref 3.5–5.1)
Sodium: 134 mmol/L — ABNORMAL LOW (ref 135–145)
Total Protein: 8.1 g/dL (ref 6.5–8.1)

## 2014-09-25 LAB — CBC WITH DIFFERENTIAL/PLATELET
BASOS ABS: 0 10*3/uL (ref 0.0–0.1)
Basophils Relative: 0 %
Eosinophils Absolute: 0.3 10*3/uL (ref 0.0–0.7)
Eosinophils Relative: 3 %
HEMATOCRIT: 48.7 % (ref 39.0–52.0)
Hemoglobin: 16.6 g/dL (ref 13.0–17.0)
Lymphocytes Relative: 20 %
Lymphs Abs: 2.3 10*3/uL (ref 0.7–4.0)
MCH: 27.1 pg (ref 26.0–34.0)
MCHC: 34.1 g/dL (ref 30.0–36.0)
MCV: 79.4 fL (ref 78.0–100.0)
Monocytes Absolute: 0.6 10*3/uL (ref 0.1–1.0)
Monocytes Relative: 5 %
NEUTROS ABS: 8.2 10*3/uL — AB (ref 1.7–7.7)
NEUTROS PCT: 72 %
Platelets: 224 10*3/uL (ref 150–400)
RBC: 6.13 MIL/uL — AB (ref 4.22–5.81)
RDW: 15.5 % (ref 11.5–15.5)
WBC: 11.5 10*3/uL — ABNORMAL HIGH (ref 4.0–10.5)

## 2014-09-25 LAB — TROPONIN I: Troponin I: <0.03 ng/mL (ref <0.031)

## 2014-09-25 LAB — PROTIME-INR
INR: 1.06 (ref 0.00–1.49)
Prothrombin Time: 14 seconds (ref 11.6–15.2)

## 2014-09-25 LAB — BRAIN NATRIURETIC PEPTIDE: B Natriuretic Peptide: 47 pg/mL (ref 0.0–100.0)

## 2014-09-25 LAB — APTT: APTT: 30 s (ref 24–37)

## 2014-09-25 MED ORDER — AZITHROMYCIN 250 MG PO TABS
500.0000 mg | ORAL_TABLET | Freq: Once | ORAL | Status: AC
  Administered 2014-09-25: 500 mg via ORAL
  Filled 2014-09-25: qty 2

## 2014-09-25 MED ORDER — HYDROCODONE-ACETAMINOPHEN 5-325 MG PO TABS
2.0000 | ORAL_TABLET | Freq: Once | ORAL | Status: AC
  Administered 2014-09-25: 2 via ORAL
  Filled 2014-09-25: qty 2

## 2014-09-25 MED ORDER — AZITHROMYCIN 250 MG PO TABS: 250.0000 mg | ORAL_TABLET | Freq: Every day | ORAL | Status: DC

## 2014-09-25 MED ORDER — ALBUTEROL SULFATE HFA 108 (90 BASE) MCG/ACT IN AERS
2.0000 | INHALATION_SPRAY | RESPIRATORY_TRACT | Status: AC
  Administered 2014-09-25: 2 via RESPIRATORY_TRACT
  Filled 2014-09-25: qty 6.7

## 2014-09-25 NOTE — ED Notes (Signed)
Pt to department via EMS.  Per report, pt became SOB after becoming exerted to call EMS for his wife.

## 2014-09-25 NOTE — ED Notes (Signed)
EKG done and given to Dr Sabra Heck

## 2014-09-25 NOTE — Discharge Instructions (Signed)
Inhaler - 2 puffs every 4 hours as needed for shortness of breath / coughing  Z pak for the next 5 days  Please obtain all of your results from medical records or have your doctors office obtain the results - share them with your doctor - you should be seen at your doctors office in the next 2 days. Call today to arrange your follow up. Take the medications as prescribed. Please review all of the medicines and only take them if you do not have an allergy to them. Please be aware that if you are taking birth control pills, taking other prescriptions, ESPECIALLY ANTIBIOTICS may make the birth control ineffective - if this is the case, either do not engage in sexual activity or use alternative methods of birth control such as condoms until you have finished the medicine and your family doctor says it is OK to restart them. If you are on a blood thinner such as COUMADIN, be aware that any other medicine that you take may cause the coumadin to either work too much, or not enough - you should have your coumadin level rechecked in next 7 days if this is the case.  ?  It is also a possibility that you have an allergic reaction to any of the medicines that you have been prescribed - Everybody reacts differently to medications and while MOST people have no trouble with most medicines, you may have a reaction such as nausea, vomiting, rash, swelling, shortness of breath. If this is the case, please stop taking the medicine immediately and contact your physician.  ?  You should return to the ER if you develop severe or worsening symptoms.

## 2014-09-25 NOTE — ED Provider Notes (Signed)
CSN: [id number]     Arrival date & time 09/25/14  1906 History   First MD Initiated Contact with Patient 09/25/14 1911     Chief Complaint  Patient presents with  . Shortness of Breath     (Consider location/radiation/quality/duration/timing/severity/associated sxs/prior Treatment) HPI  The patient is a 59 year old male, he has a known history of myocardial infarction, diabetes, hyperlipidemia, hypertension, tobacco use who presents to the hospital with shortness of breath which is diffuse, gradually worsening over the last 2 weeks and became acutely worse after he started to rush around the house trying to help his significant other this evening. His significant other was hyperglycemic, when the ambulance got there for her she refused because she was getting better but he decided to come on the ambulance because of shortness of breath. Symptoms are severe, persistent, they seem to be getting worse, he does not have a history of congestive heart failure or pulmonary embolism according to his report.  Past Medical History  Diagnosis Date  . Hypertension   . Diabetes mellitus without complication   . Hyperlipidemia   . Coronary artery disease   . MI, old   . CAD (coronary artery disease)     STEMI-PCI   . HTN (hypertension)   . Hyperlipemia   . DM2 (diabetes mellitus, type 2)   . Tobacco use   . Obesity    Past Surgical History  Procedure Laterality Date  . Coronary angioplasty with stent placement    . Coronary angioplasty with stent placement  June 2014    LAD DES  . Coronary angioplasty with stent placement  09/06/13     STEMI (LAD ISR)  . Coronary angiogram  09/07/13    residual RCA and OM disease  . Left heart cath Bilateral 07/08/2012    Procedure: LEFT HEART CATH;  Surgeon: [name], MD;  Location: [facility] CATH LAB;  Service: Cardiovascular;  Laterality: Bilateral;  . Percutaneous coronary stent intervention (pci-s)  07/08/2012    Procedure: PERCUTANEOUS CORONARY STENT  INTERVENTION (PCI-S);  Surgeon: [name], MD;  Location: [facility] CATH LAB;  Service: Cardiovascular;;  DES LAD  . Left heart catheterization with coronary angiogram N/A 09/06/2013    Procedure: LEFT HEART CATHETERIZATION WITH CORONARY ANGIOGRAM;  Surgeon: [name], MD;  Location: [facility] CATH LAB;  Service: Cardiovascular;  Laterality: N/A;  . Percutaneous coronary stent intervention (pci-s) N/A 09/06/2013    Procedure: PERCUTANEOUS CORONARY STENT INTERVENTION (PCI-S);  Surgeon: [name], MD;  Location: [facility] CATH LAB;  Service: Cardiovascular;  Laterality: N/A;  Mid LAD 3.0/24mm Promus   History reviewed. No pertinent family history. Social History  Substance Use Topics  . Smoking status: Current Every Day Smoker    Types: Cigarettes  . Smokeless tobacco: None  . Alcohol Use: Yes     Comment: occ    Review of Systems  All other systems reviewed and are negative.     Allergies  Review of patient's allergies indicates no known allergies.  Home Medications   Prior to Admission medications   Medication Sig Start Date End Date Taking? Authorizing Provider  aspirin EC 81 MG tablet Take 81 mg by mouth daily.    Yes Historical Provider, MD  atorvastatin (LIPITOR) 80 MG tablet Take 1 tablet (80 mg total) by mouth daily. 07/10/12  Yes [name], MD  carvedilol (COREG) 6.25 MG tablet Take 1 tablet (6.25 mg total) by mouth 2 (two) times daily with a meal. 05/03/14  Yes  Thurnell Lose, MD  furosemide (LASIX) 20 MG tablet Take 1 tablet (20 mg total) by mouth daily. 09/09/13  Yes [name], PA-C  HYDROcodone-acetaminophen (NORCO/VICODIN) 5-325 MG per tablet Take 1 tablet by mouth every 6 (six) hours as needed for severe pain. 11/29/12  Yes Carmin Muskrat, MD  insulin glargine (LANTUS) 100 UNIT/ML injection Inject 15-20 Units into the skin at bedtime.   Yes Historical Provider, MD  isosorbide mononitrate (IMDUR) 30 MG 24 hr tablet Take 1 tablet (30 mg total) by mouth  daily. 09/09/13  Yes [name], PA-C  lisinopril (PRINIVIL,ZESTRIL) 5 MG tablet Take 5 mg by mouth daily.   Yes Historical Provider, MD  meloxicam (MOBIC) 7.5 MG tablet Take 1 tablet (7.5 mg total) by mouth daily. 06/15/14  Yes Clayton Bibles, PA-C  metFORMIN (GLUCOPHAGE) 1000 MG tablet Take 1 tablet (1,000 mg total) by mouth 2 (two) times daily with a meal. Do not restart Metformin until Wednesday am 7/2 Patient taking differently: Take 1,000 mg by mouth daily.  07/10/12  Yes [name], MD  prasugrel (EFFIENT) 10 MG TABS tablet Take 1 tablet (10 mg total) by mouth daily. 09/09/13  Yes [name], PA-C  acetaminophen (TYLENOL) 325 MG tablet Take 2 tablets (650 mg total) by mouth every 4 (four) hours as needed for headache or mild pain. 09/09/13   [name], PA-C  amLODipine (NORVASC) 5 MG tablet Take 1 tablet (5 mg total) by mouth daily. Patient not taking: Reported on 05/01/2014 07/17/12   [name], MD  azithromycin (ZITHROMAX Z-PAK) 250 MG tablet Take 1 tablet (250 mg total) by mouth daily. 500mg  PO day 1, then 250mg  PO days 205 09/25/14   Noemi Chapel, MD  buPROPion [facility] SR) 150 MG 12 hr tablet Take 1 tablet (150 mg total) by mouth 2 (two) times daily. Patient not taking: Reported on 05/01/2014 07/10/12   [name], MD  methocarbamol (ROBAXIN) 750 MG tablet Take 1 tablet (750 mg total) by mouth every 6 (six) hours as needed for muscle spasms (or pain). Patient not taking: Reported on 09/25/2014 06/15/14   Clayton Bibles, PA-C  nicotine (NICODERM CQ) 7 mg/24hr patch Place 1 patch (7 mg total) onto the skin daily. Patient not taking: Reported on 09/25/2014 05/03/14   Thurnell Lose, MD  nitroGLYCERIN (NITROSTAT) 0.4 MG SL tablet Place 1 tablet (0.4 mg total) under the tongue every 5 (five) minutes x 3 doses as needed for chest pain. 05/03/14   Thurnell Lose, MD  Ticagrelor (BRILINTA) 90 MG TABS tablet Take 1 tablet (90 mg total) by mouth 2 (two) times daily. Patient not taking: Reported  on 05/01/2014 07/10/12   [name], MD   BP 114/81 mmHg  Pulse 84  Temp(Src) 97.6 F (36.4 C) (Oral)  Resp 33  Ht 5\' 9"  (1.753 m)  Wt 250 lb (113.399 kg)  BMI 36.90 kg/m2  SpO2 93% Physical Exam  Constitutional: He appears well-developed and well-nourished. No distress.  HENT:  Head: Normocephalic and atraumatic.  Mouth/Throat: Oropharynx is clear and moist. No oropharyngeal exudate.  Eyes: Conjunctivae and EOM are normal. Pupils are equal, round, and reactive to light. Right eye exhibits no discharge. Left eye exhibits no discharge. No scleral icterus.  Neck: Normal range of motion. Neck supple. No JVD present. No thyromegaly present.  Cardiovascular: Normal rate, regular rhythm, normal heart sounds and intact distal pulses.  Exam reveals no gallop and no friction rub.   No  murmur heard. Pulmonary/Chest: He is in respiratory distress. He has no wheezes. He has rales.  Slight rales at the bases, mild increased work of breathing with tachypnea, speaks in shortened sentences  Abdominal: Soft. Bowel sounds are normal. He exhibits no distension and no mass. There is no tenderness.  Musculoskeletal: Normal range of motion. He exhibits no edema or tenderness.  Lymphadenopathy:    He has no cervical adenopathy.  Neurological: He is alert. Coordination normal.  Skin: Skin is warm and dry. No rash noted. No erythema.  Psychiatric: He has a normal mood and affect. His behavior is normal.  Nursing note and vitals reviewed.   ED Course  Procedures (including critical care time) Labs Review Labs Reviewed  CBC WITH DIFFERENTIAL/PLATELET - Abnormal; Notable for the following:    WBC 11.5 (*)    RBC 6.13 (*)    Neutro Abs 8.2 (*)    All other components within normal limits  COMPREHENSIVE METABOLIC PANEL - Abnormal; Notable for the following:    Sodium 134 (*)    Chloride 97 (*)    Glucose, Bld 200 (*)    BUN 23 (*)    Creatinine, Ser 1.60 (*)    GFR calc non Af Amer 45 (*)    GFR  calc Af Amer 52 (*)    All other components within normal limits  TROPONIN I  PROTIME-INR  APTT  BRAIN NATRIURETIC PEPTIDE    Imaging Review Dg Chest 2 View  09/25/2014   CLINICAL DATA:  Shortness of breath with exertion tonight with intermittent mid chest pain and productive cough 2 weeks.  EXAM: CHEST  2 VIEW  COMPARISON:  05/01/2014  FINDINGS: Lungs are adequately inflated without focal consolidation or effusion. Coronary artery stent is present. There is borderline stable cardiomegaly. Remainder of the exam is unchanged.  IMPRESSION: No active cardiopulmonary disease.   Electronically Signed   By: Marin Olp M.D.   On: 09/25/2014 20:17   I have personally reviewed and evaluated these images and lab results as part of my medical decision-making.   EKG Interpretation   Date/Time:  Thursday September 25 2014 19:18:41 EDT Ventricular Rate:  66 PR Interval:  166 QRS Duration: 96 QT Interval:  396 QTC Calculation: 415 R Axis:   -77 Text Interpretation:  Sinus rhythm with occasional Premature ventricular  complexes Left axis deviation Inferior infarct , age undetermined  Anterolateral infarct , age undetermined Abnormal ECG since last tracing  no significant change Confirmed by Cornella Emmer  MD, Eustolia Drennen ([id number]) on 09/25/2014  7:27:04 PM      MDM   Final diagnoses:  Bronchitis  Shortness of breath  Renal insufficiency    The patient has a unchanged EKG, it is very abnormal. He has vital signs which are unremarkable except for his respiratory rate, he is not hypoxic on supplemental oxygen. Obtain a chest x-ray, labs, repeat evaluation. There is concern that this could be acute coronary syndrome with his history.  The patient has had symptoms for 2 weeks, it is mostly coughing and shortness of breath even when he is resting. He has no peripheral edema or signs of pulmonary edema or elevated BNP on his labs or x-ray. I personally reviewed these and find her to be no acute infiltrates  consistent with pneumonia, his lab work is otherwise unremarkable except for renal insufficiency. The patient was informed of his results and I suspect that this is more of a bronchitis that it is a cardiac process. Normal troponin, unchanged  EKG, patient appears much more comfortable after 2 hours of observation, he is no longer using oxygen and has 97% oxygen saturations on my last exam at 9:40 PM. I discussed with him the treatment including albuterol metered-dose inhaler and antibiotics for bronchitis, he is in agreement, stable for discharge  Meds given in ED:  Medications  azithromycin (ZITHROMAX) tablet 500 mg (not administered)  albuterol (PROVENTIL HFA;VENTOLIN HFA) 108 (90 BASE) MCG/ACT inhaler 2 puff (not administered)  HYDROcodone-acetaminophen (NORCO/VICODIN) 5-325 MG per tablet 2 tablet (2 tablets Oral Given 09/25/14 2107)    New Prescriptions   AZITHROMYCIN (ZITHROMAX Z-PAK) 250 MG TABLET    Take 1 tablet (250 mg total) by mouth daily. 500mg  PO day 1, then 250mg  PO days 205      Noemi Chapel, MD 09/25/14 2143

## 2014-09-27 ENCOUNTER — Emergency Department (HOSPITAL_COMMUNITY)
Admission: EM | Admit: 2014-09-27 | Discharge: 2014-09-28 | Disposition: A | Discharge status: Home / Self Care | Payer: Medicare Other | Attending: Emergency Medicine | Admitting: Emergency Medicine

## 2014-09-27 ENCOUNTER — Emergency Department (HOSPITAL_COMMUNITY): Payer: Medicare Other

## 2014-09-27 ENCOUNTER — Encounter (HOSPITAL_COMMUNITY): Payer: Self-pay | Admitting: Emergency Medicine

## 2014-09-27 DIAGNOSIS — I252 Old myocardial infarction: Secondary | ICD-10-CM | POA: Insufficient documentation

## 2014-09-27 DIAGNOSIS — Z720 Tobacco use: Secondary | ICD-10-CM | POA: Diagnosis not present

## 2014-09-27 DIAGNOSIS — Z79899 Other long term (current) drug therapy: Secondary | ICD-10-CM | POA: Diagnosis not present

## 2014-09-27 DIAGNOSIS — K859 Acute pancreatitis, unspecified: Secondary | ICD-10-CM | POA: Insufficient documentation

## 2014-09-27 DIAGNOSIS — E669 Obesity, unspecified: Secondary | ICD-10-CM | POA: Insufficient documentation

## 2014-09-27 DIAGNOSIS — R1013 Epigastric pain: Secondary | ICD-10-CM | POA: Diagnosis not present

## 2014-09-27 DIAGNOSIS — I10 Essential (primary) hypertension: Secondary | ICD-10-CM | POA: Insufficient documentation

## 2014-09-27 DIAGNOSIS — Z794 Long term (current) use of insulin: Secondary | ICD-10-CM | POA: Insufficient documentation

## 2014-09-27 DIAGNOSIS — N289 Disorder of kidney and ureter, unspecified: Secondary | ICD-10-CM | POA: Insufficient documentation

## 2014-09-27 DIAGNOSIS — Z7982 Long term (current) use of aspirin: Secondary | ICD-10-CM | POA: Insufficient documentation

## 2014-09-27 DIAGNOSIS — I2510 Atherosclerotic heart disease of native coronary artery without angina pectoris: Secondary | ICD-10-CM | POA: Insufficient documentation

## 2014-09-27 DIAGNOSIS — E785 Hyperlipidemia, unspecified: Secondary | ICD-10-CM | POA: Insufficient documentation

## 2014-09-27 DIAGNOSIS — Z9889 Other specified postprocedural states: Secondary | ICD-10-CM | POA: Diagnosis not present

## 2014-09-27 DIAGNOSIS — R112 Nausea with vomiting, unspecified: Secondary | ICD-10-CM | POA: Insufficient documentation

## 2014-09-27 DIAGNOSIS — Z9861 Coronary angioplasty status: Secondary | ICD-10-CM | POA: Insufficient documentation

## 2014-09-27 DIAGNOSIS — R0602 Shortness of breath: Secondary | ICD-10-CM | POA: Insufficient documentation

## 2014-09-27 DIAGNOSIS — E119 Type 2 diabetes mellitus without complications: Secondary | ICD-10-CM | POA: Insufficient documentation

## 2014-09-27 DIAGNOSIS — R0789 Other chest pain: Secondary | ICD-10-CM | POA: Diagnosis not present

## 2014-09-27 DIAGNOSIS — R079 Chest pain, unspecified: Secondary | ICD-10-CM | POA: Insufficient documentation

## 2014-09-27 LAB — HEPATIC FUNCTION PANEL
ALBUMIN: 3.4 g/dL — AB (ref 3.5–5.0)
ALK PHOS: 94 U/L (ref 38–126)
ALT: 19 U/L (ref 17–63)
AST: 20 U/L (ref 15–41)
BILIRUBIN INDIRECT: 0.3 mg/dL (ref 0.3–0.9)
Bilirubin, Direct: 0.2 mg/dL (ref 0.1–0.5)
TOTAL PROTEIN: 6.7 g/dL (ref 6.5–8.1)
Total Bilirubin: 0.5 mg/dL (ref 0.3–1.2)

## 2014-09-27 LAB — BASIC METABOLIC PANEL
ANION GAP: 9 (ref 5–15)
BUN: 27 mg/dL — ABNORMAL HIGH (ref 6–20)
CALCIUM: 8.9 mg/dL (ref 8.9–10.3)
CHLORIDE: 100 mmol/L — AB (ref 101–111)
CO2: 23 mmol/L (ref 22–32)
Creatinine, Ser: 1.99 mg/dL — ABNORMAL HIGH (ref 0.61–1.24)
GFR calc non Af Amer: 35 mL/min — ABNORMAL LOW (ref >60)
GFR, EST AFRICAN AMERICAN: 40 mL/min — AB (ref >60)
GLUCOSE: 198 mg/dL — AB (ref 65–99)
POTASSIUM: 5.6 mmol/L — AB (ref 3.5–5.1)
Sodium: 132 mmol/L — ABNORMAL LOW (ref 135–145)

## 2014-09-27 LAB — URINALYSIS, ROUTINE W REFLEX MICROSCOPIC
GLUCOSE, UA: NEGATIVE mg/dL
Hgb urine dipstick: NEGATIVE
KETONES UR: 15 mg/dL — AB
Leukocytes, UA: NEGATIVE
Nitrite: NEGATIVE
PH: 5.5 (ref 5.0–8.0)
Protein, ur: NEGATIVE mg/dL
SPECIFIC GRAVITY, URINE: 1.023 (ref 1.005–1.030)
Urobilinogen, UA: 0.2 mg/dL (ref 0.0–1.0)

## 2014-09-27 LAB — CBC
HEMATOCRIT: 44.2 % (ref 39.0–52.0)
HEMOGLOBIN: 14.8 g/dL (ref 13.0–17.0)
MCH: 26.8 pg (ref 26.0–34.0)
MCHC: 33.5 g/dL (ref 30.0–36.0)
MCV: 79.9 fL (ref 78.0–100.0)
Platelets: 234 10*3/uL (ref 150–400)
RBC: 5.53 MIL/uL (ref 4.22–5.81)
RDW: 15.6 % — ABNORMAL HIGH (ref 11.5–15.5)
WBC: 12.1 10*3/uL — ABNORMAL HIGH (ref 4.0–10.5)

## 2014-09-27 LAB — I-STAT TROPONIN, ED: Troponin i, poc: 0 ng/mL (ref 0.00–0.08)

## 2014-09-27 LAB — I-STAT CREATININE, ED: Creatinine, Ser: 2.1 mg/dL — ABNORMAL HIGH (ref 0.61–1.24)

## 2014-09-27 LAB — LIPASE, BLOOD: Lipase: 66 U/L — ABNORMAL HIGH (ref 22–51)

## 2014-09-27 LAB — BRAIN NATRIURETIC PEPTIDE: B NATRIURETIC PEPTIDE 5: 50.5 pg/mL (ref 0.0–100.0)

## 2014-09-27 MED ORDER — ONDANSETRON HCL 4 MG/2ML IJ SOLN
4.0000 mg | Freq: Once | INTRAMUSCULAR | Status: AC
  Administered 2014-09-27: 4 mg via INTRAVENOUS
  Filled 2014-09-27: qty 2

## 2014-09-27 MED ORDER — SODIUM CHLORIDE 0.9 % IV BOLUS (SEPSIS)
1000.0000 mL | Freq: Once | INTRAVENOUS | Status: AC
  Administered 2014-09-27: 1000 mL via INTRAVENOUS

## 2014-09-27 MED ORDER — FENTANYL CITRATE (PF) 100 MCG/2ML IJ SOLN
50.0000 ug | Freq: Once | INTRAMUSCULAR | Status: AC
  Administered 2014-09-27: 50 ug via INTRAVENOUS
  Filled 2014-09-27: qty 2

## 2014-09-27 NOTE — Progress Notes (Signed)
Spoke to [employer] she is questioning dissection but does want to wait on patients creatinine before scanning.

## 2014-09-27 NOTE — ED Notes (Signed)
Jen PA notified of pt BP, will get manual BP

## 2014-09-27 NOTE — ED Notes (Signed)
Per EMS:  Pt here from [facility].  EMS came out for CP.  Pt was "tripoding" upon arrival.  Pt does not wear home O2.  Pt was recently diagnosed with bronchitis.  Pt was placed on NRB.  When taken off, pt becomes SOB.  Pt has a hx of 2 stents and 1 MI.  Pt took 1 nitro before EMS arrival and had ASA with EMS.  EMS states 12 leads were unremarkable.

## 2014-09-27 NOTE — ED Provider Notes (Signed)
CSN: [id number]     Arrival date & time 09/27/14  2007 History   First MD Initiated Contact with Patient 09/27/14 2012     Chief Complaint  Patient presents with  . Chest Pain     (Consider location/radiation/quality/duration/timing/severity/associated sxs/prior Treatment) HPI Comments: Patient is a 59 year old male past medical history significant for myocardial infarction, diabetes, hyperlipidemia, hypertension, tobacco use presenting to the emergency department for evaluation of central chest pressure with radiation to back and abdomen. He states it was intermittent since this morning became more constant prior to arrival. He tried taking one nitroglycerin at home with little to no improvement. He was given 324 mg of aspirin via EMS little to no improvement of symptoms. He was recently diagnosed with bronchitis. Has a history of MI with stent placement. He is due to follow-up with a cardiologist in the next month at the [place].   Patient is a 59 y.o. male presenting with chest pain.  Chest Pain Associated symptoms: nausea, shortness of breath and vomiting     Past Medical History  Diagnosis Date  . Hypertension   . Diabetes mellitus without complication   . Hyperlipidemia   . Coronary artery disease   . MI, old   . CAD (coronary artery disease)     STEMI-PCI   . HTN (hypertension)   . Hyperlipemia   . DM2 (diabetes mellitus, type 2)   . Tobacco use   . Obesity    Past Surgical History  Procedure Laterality Date  . Coronary angioplasty with stent placement    . Coronary angioplasty with stent placement  June 2014    LAD DES  . Coronary angioplasty with stent placement  09/06/13     STEMI (LAD ISR)  . Coronary angiogram  09/07/13    residual RCA and OM disease  . Left heart cath Bilateral 07/08/2012    Procedure: LEFT HEART CATH;  Surgeon: [name], MD;  Location: [facility] CATH LAB;  Service: Cardiovascular;  Laterality: Bilateral;  . Percutaneous coronary stent intervention  (pci-s)  07/08/2012    Procedure: PERCUTANEOUS CORONARY STENT INTERVENTION (PCI-S);  Surgeon: [name], MD;  Location: [facility] CATH LAB;  Service: Cardiovascular;;  DES LAD  . Left heart catheterization with coronary angiogram N/A 09/06/2013    Procedure: LEFT HEART CATHETERIZATION WITH CORONARY ANGIOGRAM;  Surgeon: [name], MD;  Location: [facility] CATH LAB;  Service: Cardiovascular;  Laterality: N/A;  . Percutaneous coronary stent intervention (pci-s) N/A 09/06/2013    Procedure: PERCUTANEOUS CORONARY STENT INTERVENTION (PCI-S);  Surgeon: [name], MD;  Location: [facility] CATH LAB;  Service: Cardiovascular;  Laterality: N/A;  Mid LAD 3.0/24mm Promus   History reviewed. No pertinent family history. Social History  Substance Use Topics  . Smoking status: Current Every Day Smoker    Types: Cigarettes  . Smokeless tobacco: None  . Alcohol Use: Yes     Comment: occ    Review of Systems  Respiratory: Positive for shortness of breath.   Cardiovascular: Positive for chest pain.  Gastrointestinal: Positive for nausea and vomiting.  All other systems reviewed and are negative.     Allergies  Review of patient's allergies indicates no known allergies.  Home Medications   Prior to Admission medications   Medication Sig Start Date End Date Taking? Authorizing Provider  acetaminophen (TYLENOL) 325 MG tablet Take 2 tablets (650 mg total) by mouth every 4 (four) hours as needed for headache or mild pain. 09/09/13  Yes [name], PA-C  aspirin EC 81 MG tablet Take 81 mg by mouth daily.    Yes Historical Provider, MD  atorvastatin (LIPITOR) 80 MG tablet Take 1 tablet (80 mg total) by mouth daily. 07/10/12  Yes [name], MD  azithromycin (ZITHROMAX Z-PAK) 250 MG tablet Take 1 tablet (250 mg total) by mouth daily. 500mg  PO day 1, then 250mg  PO days 205 09/25/14  Yes Noemi Chapel, MD  buPROPion [facility] SR) 150 MG 12 hr tablet Take 1 tablet (150 mg total) by mouth 2 (two)  times daily. 07/10/12  Yes [name], MD  carvedilol (COREG) 6.25 MG tablet Take 1 tablet (6.25 mg total) by mouth 2 (two) times daily with a meal. 05/03/14  Yes Thurnell Lose, MD  furosemide (LASIX) 20 MG tablet Take 1 tablet (20 mg total) by mouth daily. 09/09/13  Yes [name], PA-C  insulin glargine (LANTUS) 100 UNIT/ML injection Inject 15-20 Units into the skin at bedtime.   Yes Historical Provider, MD  isosorbide mononitrate (IMDUR) 30 MG 24 hr tablet Take 1 tablet (30 mg total) by mouth daily. 09/09/13  Yes [name], PA-C  lisinopril (PRINIVIL,ZESTRIL) 5 MG tablet Take 5 mg by mouth daily.   Yes Historical Provider, MD  meloxicam (MOBIC) 7.5 MG tablet Take 1 tablet (7.5 mg total) by mouth daily. 06/15/14  Yes Clayton Bibles, PA-C  metFORMIN (GLUCOPHAGE) 1000 MG tablet Take 1 tablet (1,000 mg total) by mouth 2 (two) times daily with a meal. Do not restart Metformin until Wednesday am 7/2 Patient taking differently: Take 1,000 mg by mouth daily.  07/10/12  Yes [name], MD  nitroGLYCERIN (NITROSTAT) 0.4 MG SL tablet Place 1 tablet (0.4 mg total) under the tongue every 5 (five) minutes x 3 doses as needed for chest pain. 05/03/14  Yes Thurnell Lose, MD  prasugrel (EFFIENT) 10 MG TABS tablet Take 1 tablet (10 mg total) by mouth daily. 09/09/13  Yes [name], PA-C  amLODipine (NORVASC) 5 MG tablet Take 1 tablet (5 mg total) by mouth daily. Patient not taking: Reported on 05/01/2014 07/17/12   [name], MD  HYDROcodone-acetaminophen (NORCO/VICODIN) 5-325 MG per tablet Take 1-2 tablets by mouth every 6 (six) hours as needed for severe pain. 09/28/14   Jennifer Piepenbrink, PA-C  nicotine (NICODERM CQ) 7 mg/24hr patch Place 1 patch (7 mg total) onto the skin daily. Patient not taking: Reported on 09/25/2014 05/03/14   Thurnell Lose, MD  ondansetron (ZOFRAN ODT) 4 MG disintegrating tablet Take 1 tablet (4 mg total) by mouth every 8 (eight) hours as needed for nausea or vomiting.  09/28/14   Baron Sane, PA-C  Ticagrelor (BRILINTA) 90 MG TABS tablet Take 1 tablet (90 mg total) by mouth 2 (two) times daily. Patient not taking: Reported on 05/01/2014 07/10/12   [name], MD   BP 98/61 mmHg  Pulse 61  Temp(Src) 97.4 F (36.3 C) (Oral)  Resp 16  SpO2 98% Physical Exam  Constitutional: He is oriented to person, place, and time. He appears well-developed and well-nourished.  Uncomfortable appearing.  HENT:  Head: Normocephalic and atraumatic.  Right Ear: External ear normal.  Left Ear: External ear normal.  Nose: Nose normal.  Eyes: Conjunctivae are normal.  Neck: Neck supple.  Cardiovascular: Normal rate, regular rhythm, normal heart sounds and intact distal pulses.   Pulmonary/Chest: Effort normal and breath sounds normal. Tachypnea noted. He exhibits tenderness.  Abdominal: Soft. Bowel sounds are normal. There is tenderness (epigastric).  Musculoskeletal: He exhibits no edema.  Neurological: He is alert and oriented to person, place, and time.  Skin: Skin is warm and dry.  Nursing note and vitals reviewed.   ED Course  Procedures (including critical care time) Medications  sodium chloride 0.9 % bolus 1,000 mL (0 mLs Intravenous Stopped 09/28/14 0010)  ondansetron (ZOFRAN) injection 4 mg (4 mg Intravenous Given 09/27/14 2052)  fentaNYL (SUBLIMAZE) injection 50 mcg (50 mcg Intravenous Given 09/27/14 2052)  sodium chloride 0.9 % bolus 1,000 mL (0 mLs Intravenous Stopped 09/27/14 2224)  fentaNYL (SUBLIMAZE) injection 50 mcg (50 mcg Intravenous Given 09/28/14 0058)    Labs Review Labs Reviewed  BASIC METABOLIC PANEL - Abnormal; Notable for the following:    Sodium 132 (*)    Potassium 5.6 (*)    Chloride 100 (*)    Glucose, Bld 198 (*)    BUN 27 (*)    Creatinine, Ser 1.99 (*)    GFR calc non Af Amer 35 (*)    GFR calc Af Amer 40 (*)    All other components within normal limits  CBC - Abnormal; Notable for the following:    WBC 12.1 (*)     RDW 15.6 (*)    All other components within normal limits  HEPATIC FUNCTION PANEL - Abnormal; Notable for the following:    Albumin 3.4 (*)    All other components within normal limits  LIPASE, BLOOD - Abnormal; Notable for the following:    Lipase 66 (*)    All other components within normal limits  URINALYSIS, ROUTINE W REFLEX MICROSCOPIC (NOT AT [facility]) - Abnormal; Notable for the following:    Bilirubin Urine SMALL (*)    Ketones, ur 15 (*)    All other components within normal limits  I-STAT CREATININE, ED - Abnormal; Notable for the following:    Creatinine, Ser 2.10 (*)    All other components within normal limits  BRAIN NATRIURETIC PEPTIDE  I-STAT TROPOININ, ED  Randolm Idol, ED    Imaging Review Dg Chest Port 1 View  09/27/2014   CLINICAL DATA:  59 year old male with chest pain  EXAM: PORTABLE CHEST - 1 VIEW  COMPARISON:  None.  FINDINGS: Stable cardiac silhouette. Both lungs are clear. The visualized skeletal structures are unremarkable.  IMPRESSION: No active disease.   Electronically Signed   By: Anner Crete M.D.   On: 09/27/2014 20:55   I have personally reviewed and evaluated these images and lab results as part of my medical decision-making.   EKG Interpretation None      Hypotension improved after IVF administration, likely secondary to nitroglycerin administration PTA.  MDM   Final diagnoses:  Acute pancreatitis, unspecified pancreatitis type  Renal impairment  Intermittent chest pain    Filed Vitals:   09/28/14 0030  BP: 98/61  Pulse: 61  Temp:   Resp: 16   Afebrile, NAD, non-toxic appearing, AAOx4.   Patient is to be discharged with recommendation to follow up with PCP in regards to today's hospital visit. Chest pain is not likely of cardiac or pulmonary etiology d/t presentation, no hypoxia tachycardia or tachypnea to suggest PE, VSS, no tracheal deviation, no JVD or new murmur, RRR, breath sounds equal bilaterally, EKG without acute  abnormalities, negative delta troponin, and negative CXR along with intermittent symptoms for 2 weeks. Abdomen soft, tender in epigastric region, no peritoneal signs. Lipase is elevated likely cause of nausea and vomiting. Creatine and BUN at baseline renal insufficiency. Symptoms controlled in ED.  Patient able to tolerate PO intake. Will d/c home with anti-emetics, pain medication, and advised clear liquid diet. Advised PCP and cardiology follow up. Return precautions discussed. Patient is agreeable to plan. Patient is stable at time of discharge  Case has been discussed with and seen by Dr. Johnney Killian who agrees with the above plan to discharge.     Baron Sane, PA-C 09/28/14 1945  Charlesetta Shanks, MD 09/28/14 [phone]

## 2014-09-28 DIAGNOSIS — R079 Chest pain, unspecified: Secondary | ICD-10-CM | POA: Diagnosis not present

## 2014-09-28 LAB — I-STAT TROPONIN, ED: TROPONIN I, POC: 0.01 ng/mL (ref 0.00–0.08)

## 2014-09-28 MED ORDER — ONDANSETRON 4 MG PO TBDP: 4.0000 mg | ORAL_TABLET | Freq: Three times a day (TID) | ORAL | Status: DC | PRN

## 2014-09-28 MED ORDER — FENTANYL CITRATE (PF) 100 MCG/2ML IJ SOLN
50.0000 ug | Freq: Once | INTRAMUSCULAR | Status: AC
  Administered 2014-09-28: 50 ug via INTRAVENOUS
  Filled 2014-09-28: qty 2

## 2014-09-28 MED ORDER — HYDROCODONE-ACETAMINOPHEN 5-325 MG PO TABS: 1.0000 | ORAL_TABLET | Freq: Four times a day (QID) | ORAL | Status: DC | PRN

## 2014-09-28 NOTE — Discharge Instructions (Signed)
Please follow up with your primary care physician in 1-2 days. If you do not have one please call the [place] number listed above. Please follow up with your heart doctor to schedule a follow up appointment.  Please take pain medication and/or muscle relaxants as prescribed and as needed for pain. Please do not drive on narcotic pain medication or on muscle relaxants. Please read all discharge instructions and return precautions.    Acute Pancreatitis Acute pancreatitis is a disease in which the pancreas becomes suddenly inflamed. The pancreas is a large gland located behind your stomach. The pancreas produces enzymes that help digest food. The pancreas also releases the hormones glucagon and insulin that help regulate blood sugar. Damage to the pancreas occurs when the digestive enzymes from the pancreas are activated and begin attacking the pancreas before being released into the intestine. Most acute attacks last a couple of days and can cause serious complications. Some people become dehydrated and develop low blood pressure. In severe cases, bleeding into the pancreas can lead to shock and can be life-threatening. The lungs, heart, and kidneys may fail. CAUSES  Pancreatitis can happen to anyone. In some cases, the cause is unknown. Most cases are caused by:  Alcohol abuse.  Gallstones. Other less common causes are:  Certain medicines.  Exposure to certain chemicals.  Infection.  Damage caused by an accident (trauma).  Abdominal surgery. SYMPTOMS   Pain in the upper abdomen that may radiate to the back.  Tenderness and swelling of the abdomen.  Nausea and vomiting. DIAGNOSIS  Your caregiver will perform a physical exam. Blood and stool tests may be done to confirm the diagnosis. Imaging tests may also be done, such as X-rays, CT scans, or an ultrasound of the abdomen. TREATMENT  Treatment usually requires a stay in the hospital. Treatment may  include:  Pain medicine.  Fluid replacement through an intravenous line (IV).  Placing a tube in the stomach to remove stomach contents and control vomiting.  Not eating for 3 or 4 days. This gives your pancreas a rest, because enzymes are not being produced that can cause further damage.  Antibiotic medicines if your condition is caused by an infection.  Surgery of the pancreas or gallbladder. HOME CARE INSTRUCTIONS   Follow the diet advised by your caregiver. This may involve avoiding alcohol and decreasing the amount of fat in your diet.  Eat smaller, more frequent meals. This reduces the amount of digestive juices the pancreas produces.  Drink enough fluids to keep your urine clear or pale yellow.  Only take over-the-counter or prescription medicines as directed by your caregiver.  Avoid drinking alcohol if it caused your condition.  Do not smoke.  Get plenty of rest.  Check your blood sugar at home as directed by your caregiver.  Keep all follow-up appointments as directed by your caregiver. SEEK MEDICAL CARE IF:   You do not recover as quickly as expected.  You develop new or worsening symptoms.  You have persistent pain, weakness, or nausea.  You recover and then have another episode of pain. SEEK IMMEDIATE MEDICAL CARE IF:   You are unable to eat or keep fluids down.  Your pain becomes severe.  You have a fever or persistent symptoms for more than 2 to 3 days.  You have a fever and your symptoms suddenly get worse.  Your skin or the white part of your eyes turn yellow (jaundice).  You develop vomiting.  You feel dizzy,  or you faint.  Your blood sugar is high (over 300 mg/dL). MAKE SURE YOU:   Understand these instructions.  Will watch your condition.  Will get help right away if you are not doing well or get worse. Document Released: 12/27/2004 Document Revised: 06/28/2011 Document Reviewed: 04/07/2011 [facility] Patient Information 2015  [place], [place]. This information is not intended to replace advice given to you by your health care provider. Make sure you discuss any questions you have with your health care provider. Clear Liquid Diet A clear liquid diet is a short-term diet that is prescribed to provide the necessary fluid and basic energy you need when you can have nothing else. The clear liquid diet consists of liquids or solids that will become liquid at room temperature. You should be able to see through the liquid. There are many reasons that you may be restricted to clear liquids, such as:  When you have a sudden-onset (acute) condition that occurs before or after surgery.  To help your body slowly get adjusted to food again after a long period when you were unable to have food.  Replacement of fluids when you have a diarrheal disease.  When you are going to have certain exams, such as a colonoscopy, in which instruments are inserted inside your body to look at parts of your digestive system. WHAT CAN I HAVE? A clear liquid diet does not provide all the nutrients you need. It is important to choose a variety of the following items to get as many nutrients as possible:  Vegetable juices that do not have pulp.  Fruit juices and fruit drinks that do not have pulp.  Coffee (regular or decaffeinated), tea, or soda at the discretion of your health care provider.  Clear bouillon, broth, or strained broth-based soups.  High-protein and flavored gelatins.  Sugar or honey.  Ices or frozen ice pops that do not contain milk. If you are not sure whether you can have certain items, you should ask your health care provider. You may also ask your health care provider if there are any other clear liquid options. Document Released: 12/27/2004 Document Revised: 01/01/2013 Document Reviewed: 11/23/2012 [facility] Patient Information 2015 [place], [place]. This information is not intended to replace advice given to you by your health  care provider. Make sure you discuss any questions you have with your health care provider.

## 2014-10-08 IMAGING — CR DG CHEST 1V PORT
1 series · 1 of 1 positions shown · non-contrast
Comparison: 11/21/2009

CLINICAL DATA: Chest pain

PORTABLE CHEST - 1 VIEW

[portable]
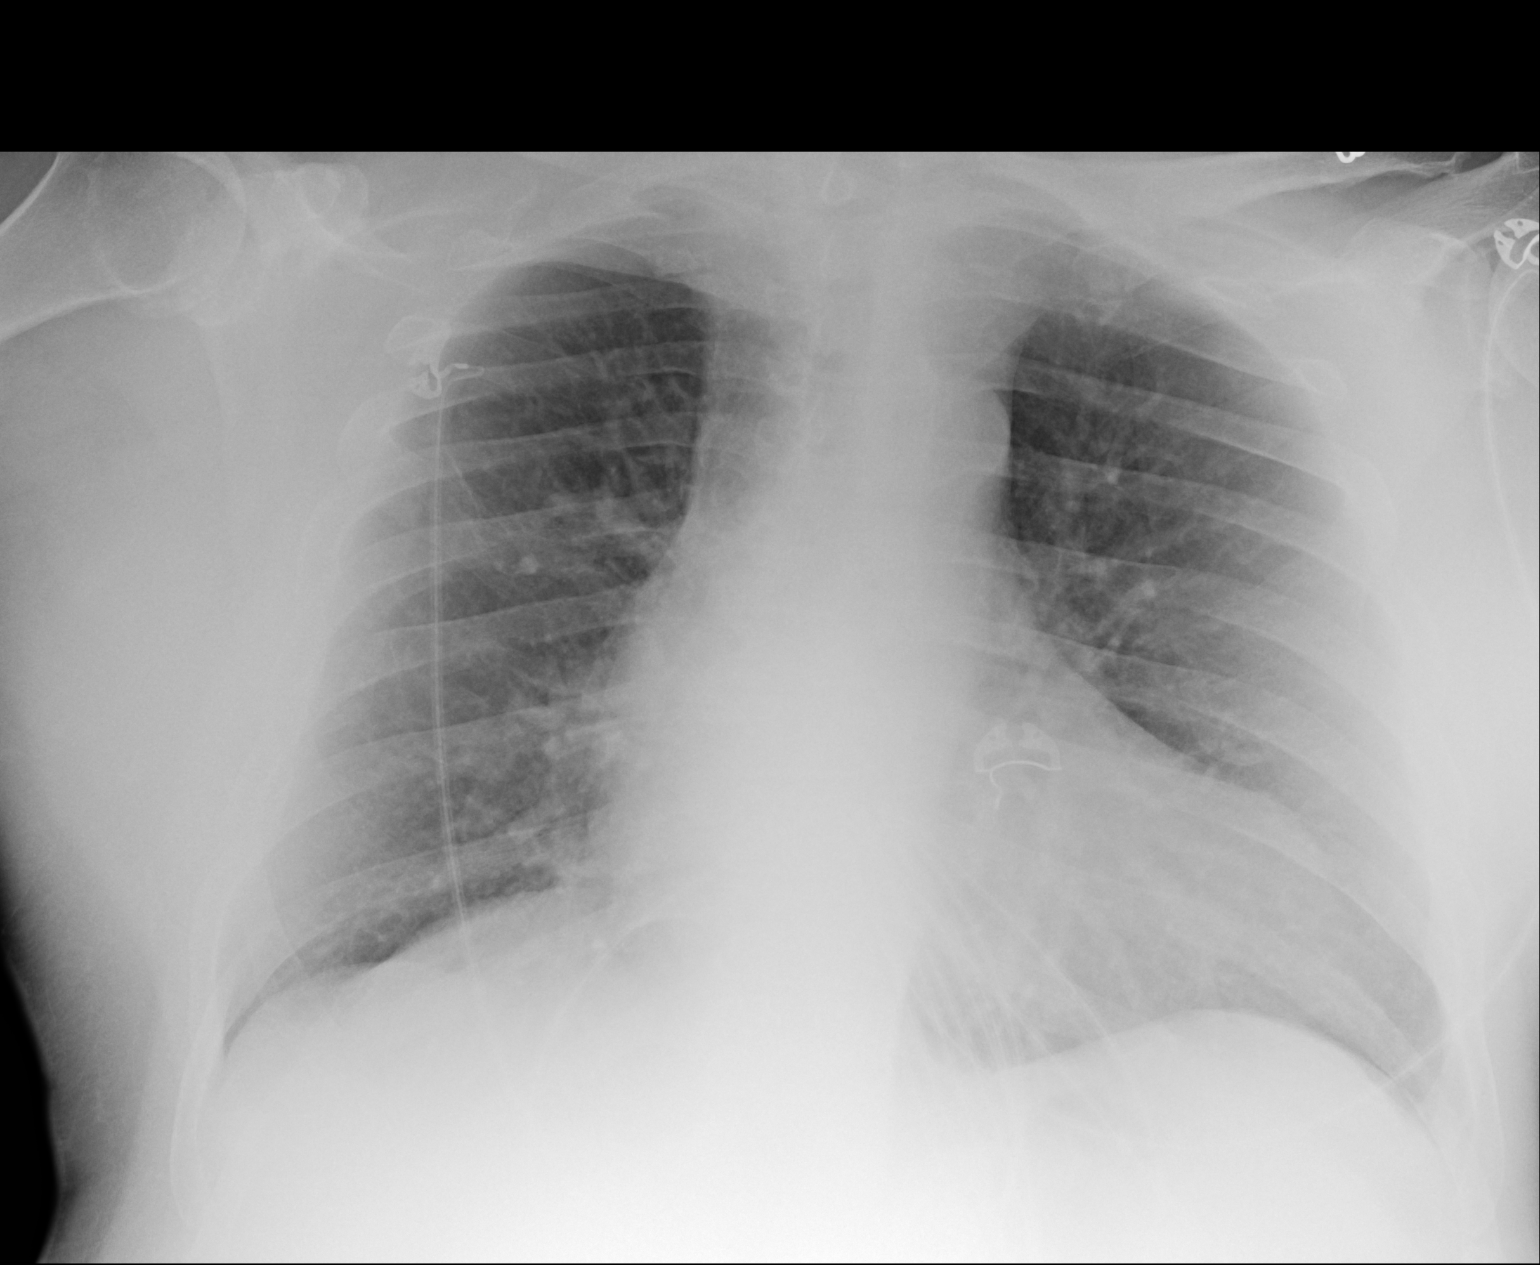

[1 of 1 positions shown; findings below may reference images not displayed]

FINDINGS: Low lung volumes.  Lungs are grossly clear.  Mild
cardiomegaly.  Normal vascularity.  No pneumothorax.
IMPRESSION: Cardiomegaly without decompensation.

## 2015-01-20 DIAGNOSIS — J9811 Atelectasis: Secondary | ICD-10-CM | POA: Diagnosis not present

## 2015-01-20 DIAGNOSIS — I723 Aneurysm of iliac artery: Secondary | ICD-10-CM | POA: Diagnosis not present

## 2015-01-20 DIAGNOSIS — R55 Syncope and collapse: Secondary | ICD-10-CM | POA: Diagnosis not present

## 2015-01-20 DIAGNOSIS — R918 Other nonspecific abnormal finding of lung field: Secondary | ICD-10-CM | POA: Diagnosis not present

## 2015-01-21 DIAGNOSIS — R931 Abnormal findings on diagnostic imaging of heart and coronary circulation: Secondary | ICD-10-CM | POA: Diagnosis not present

## 2015-01-21 DIAGNOSIS — Z9989 Dependence on other enabling machines and devices: Secondary | ICD-10-CM

## 2015-01-21 DIAGNOSIS — R569 Unspecified convulsions: Secondary | ICD-10-CM | POA: Diagnosis not present

## 2015-01-21 DIAGNOSIS — G4733 Obstructive sleep apnea (adult) (pediatric): Secondary | ICD-10-CM | POA: Insufficient documentation

## 2015-01-21 DIAGNOSIS — E1165 Type 2 diabetes mellitus with hyperglycemia: Secondary | ICD-10-CM | POA: Diagnosis not present

## 2015-01-21 DIAGNOSIS — I9589 Other hypotension: Secondary | ICD-10-CM | POA: Diagnosis not present

## 2015-01-21 DIAGNOSIS — R55 Syncope and collapse: Secondary | ICD-10-CM | POA: Diagnosis not present

## 2015-01-21 DIAGNOSIS — I517 Cardiomegaly: Secondary | ICD-10-CM | POA: Diagnosis not present

## 2015-01-21 DIAGNOSIS — E1140 Type 2 diabetes mellitus with diabetic neuropathy, unspecified: Secondary | ICD-10-CM | POA: Diagnosis not present

## 2015-01-22 DIAGNOSIS — R55 Syncope and collapse: Secondary | ICD-10-CM | POA: Diagnosis not present

## 2015-01-22 DIAGNOSIS — Z8679 Personal history of other diseases of the circulatory system: Secondary | ICD-10-CM | POA: Diagnosis not present

## 2015-01-22 DIAGNOSIS — I9589 Other hypotension: Secondary | ICD-10-CM | POA: Diagnosis not present

## 2015-01-22 DIAGNOSIS — E1165 Type 2 diabetes mellitus with hyperglycemia: Secondary | ICD-10-CM | POA: Diagnosis not present

## 2015-01-22 DIAGNOSIS — I959 Hypotension, unspecified: Secondary | ICD-10-CM | POA: Diagnosis not present

## 2015-01-22 DIAGNOSIS — E1140 Type 2 diabetes mellitus with diabetic neuropathy, unspecified: Secondary | ICD-10-CM | POA: Diagnosis not present

## 2015-02-28 ENCOUNTER — Emergency Department (HOSPITAL_COMMUNITY)
Admission: EM | Admit: 2015-02-28 | Discharge: 2015-02-28 | Disposition: A | Discharge status: Home / Self Care | Payer: Medicare Other | Attending: Emergency Medicine | Admitting: Emergency Medicine

## 2015-02-28 ENCOUNTER — Emergency Department (HOSPITAL_COMMUNITY): Payer: Medicare Other

## 2015-02-28 ENCOUNTER — Encounter (HOSPITAL_COMMUNITY): Payer: Self-pay | Admitting: *Deleted

## 2015-02-28 DIAGNOSIS — Z794 Long term (current) use of insulin: Secondary | ICD-10-CM | POA: Insufficient documentation

## 2015-02-28 DIAGNOSIS — R079 Chest pain, unspecified: Secondary | ICD-10-CM | POA: Diagnosis present

## 2015-02-28 DIAGNOSIS — Z7902 Long term (current) use of antithrombotics/antiplatelets: Secondary | ICD-10-CM | POA: Insufficient documentation

## 2015-02-28 DIAGNOSIS — E669 Obesity, unspecified: Secondary | ICD-10-CM | POA: Insufficient documentation

## 2015-02-28 DIAGNOSIS — I252 Old myocardial infarction: Secondary | ICD-10-CM | POA: Diagnosis not present

## 2015-02-28 DIAGNOSIS — E119 Type 2 diabetes mellitus without complications: Secondary | ICD-10-CM | POA: Diagnosis not present

## 2015-02-28 DIAGNOSIS — Z791 Long term (current) use of non-steroidal anti-inflammatories (NSAID): Secondary | ICD-10-CM | POA: Insufficient documentation

## 2015-02-28 DIAGNOSIS — I10 Essential (primary) hypertension: Secondary | ICD-10-CM | POA: Diagnosis not present

## 2015-02-28 DIAGNOSIS — Z7982 Long term (current) use of aspirin: Secondary | ICD-10-CM | POA: Diagnosis not present

## 2015-02-28 DIAGNOSIS — F17210 Nicotine dependence, cigarettes, uncomplicated: Secondary | ICD-10-CM | POA: Diagnosis not present

## 2015-02-28 DIAGNOSIS — E785 Hyperlipidemia, unspecified: Secondary | ICD-10-CM | POA: Diagnosis not present

## 2015-02-28 DIAGNOSIS — Z7984 Long term (current) use of oral hypoglycemic drugs: Secondary | ICD-10-CM | POA: Insufficient documentation

## 2015-02-28 DIAGNOSIS — J9811 Atelectasis: Secondary | ICD-10-CM | POA: Diagnosis not present

## 2015-02-28 DIAGNOSIS — I2510 Atherosclerotic heart disease of native coronary artery without angina pectoris: Secondary | ICD-10-CM | POA: Diagnosis not present

## 2015-02-28 DIAGNOSIS — Z79899 Other long term (current) drug therapy: Secondary | ICD-10-CM | POA: Diagnosis not present

## 2015-02-28 LAB — BASIC METABOLIC PANEL
Anion gap: 10 (ref 5–15)
BUN: 26 mg/dL — AB (ref 6–20)
CO2: 25 mmol/L (ref 22–32)
Calcium: 8.9 mg/dL (ref 8.9–10.3)
Chloride: 95 mmol/L — ABNORMAL LOW (ref 101–111)
Creatinine, Ser: 1.61 mg/dL — ABNORMAL HIGH (ref 0.61–1.24)
GFR calc Af Amer: 52 mL/min — ABNORMAL LOW (ref >60)
GFR, EST NON AFRICAN AMERICAN: 45 mL/min — AB (ref >60)
GLUCOSE: 450 mg/dL — AB (ref 65–99)
POTASSIUM: 5.1 mmol/L (ref 3.5–5.1)
Sodium: 130 mmol/L — ABNORMAL LOW (ref 135–145)

## 2015-02-28 LAB — CBC
HEMATOCRIT: 47.5 % (ref 39.0–52.0)
Hemoglobin: 16 g/dL (ref 13.0–17.0)
MCH: 27.4 pg (ref 26.0–34.0)
MCHC: 33.7 g/dL (ref 30.0–36.0)
MCV: 81.2 fL (ref 78.0–100.0)
PLATELETS: 275 10*3/uL (ref 150–400)
RBC: 5.85 MIL/uL — ABNORMAL HIGH (ref 4.22–5.81)
RDW: 14.5 % (ref 11.5–15.5)
WBC: 16.5 10*3/uL — ABNORMAL HIGH (ref 4.0–10.5)

## 2015-02-28 LAB — TROPONIN I

## 2015-02-28 MED ORDER — SODIUM CHLORIDE 0.9 % IV BOLUS (SEPSIS)
1000.0000 mL | Freq: Once | INTRAVENOUS | Status: AC
  Administered 2015-02-28: 1000 mL via INTRAVENOUS

## 2015-02-28 MED ORDER — PANTOPRAZOLE SODIUM 40 MG IV SOLR
40.0000 mg | Freq: Once | INTRAVENOUS | Status: AC
  Administered 2015-02-28: 40 mg via INTRAVENOUS
  Filled 2015-02-28: qty 40

## 2015-02-28 MED ORDER — FAMOTIDINE 20 MG PO TABS: 20.0000 mg | ORAL_TABLET | Freq: Two times a day (BID) | ORAL | Status: DC

## 2015-02-28 MED ORDER — GI COCKTAIL ~~LOC~~
30.0000 mL | Freq: Once | ORAL | Status: AC
  Administered 2015-02-28: 30 mL via ORAL
  Filled 2015-02-28: qty 30

## 2015-02-28 MED ORDER — SODIUM CHLORIDE 0.9 % IV BOLUS (SEPSIS)
500.0000 mL | Freq: Once | INTRAVENOUS | Status: AC
  Administered 2015-02-28: 500 mL via INTRAVENOUS

## 2015-02-28 NOTE — ED Notes (Signed)
Lab at bedside

## 2015-02-28 NOTE — ED Notes (Signed)
MD at bedside. 

## 2015-02-28 NOTE — ED Provider Notes (Signed)
CSN: [id number]     Arrival date & time 02/28/15  1613 History   First MD Initiated Contact with Patient 02/28/15 1615     Chief Complaint  Patient presents with  . Chest Pain     (Consider location/radiation/quality/duration/timing/severity/associated sxs/prior Treatment) HPI... Sense of indigestion or burning in his central chest for 2-3 days intermittently. Symptoms are not associated with any activities. Maalox seems to help. No dyspnea, diaphoresis, nausea. He does have many cardiac risk factors. He takes his medicines intermittently. Status post stent in 2015. Severity is mild to moderate.  Past Medical History  Diagnosis Date  . Hypertension   . Diabetes mellitus without complication ([place])   . Hyperlipidemia   . Coronary artery disease   . MI, old   . CAD (coronary artery disease)     STEMI-PCI   . HTN (hypertension)   . Hyperlipemia   . DM2 (diabetes mellitus, type 2) ([place])   . Tobacco use   . Obesity    Past Surgical History  Procedure Laterality Date  . Coronary angioplasty with stent placement    . Coronary angioplasty with stent placement  June 2014    LAD DES  . Coronary angioplasty with stent placement  09/06/13     STEMI (LAD ISR)  . Coronary angiogram  09/07/13    residual RCA and OM disease  . Left heart cath Bilateral 07/08/2012    Procedure: LEFT HEART CATH;  Surgeon: [name], MD;  Location: [facility] CATH LAB;  Service: Cardiovascular;  Laterality: Bilateral;  . Percutaneous coronary stent intervention (pci-s)  07/08/2012    Procedure: PERCUTANEOUS CORONARY STENT INTERVENTION (PCI-S);  Surgeon: [name], MD;  Location: [facility] CATH LAB;  Service: Cardiovascular;;  DES LAD  . Left heart catheterization with coronary angiogram N/A 09/06/2013    Procedure: LEFT HEART CATHETERIZATION WITH CORONARY ANGIOGRAM;  Surgeon: [name], MD;  Location: [facility] CATH LAB;  Service: Cardiovascular;  Laterality: N/A;  . Percutaneous coronary stent intervention  (pci-s) N/A 09/06/2013    Procedure: PERCUTANEOUS CORONARY STENT INTERVENTION (PCI-S);  Surgeon: [name], MD;  Location: [facility] CATH LAB;  Service: Cardiovascular;  Laterality: N/A;  Mid LAD 3.0/24mm Promus   No family history on file. Social History  Substance Use Topics  . Smoking status: Current Every Day Smoker    Types: Cigarettes  . Smokeless tobacco: None  . Alcohol Use: Yes     Comment: occ    Review of Systems  All other systems reviewed and are negative.     Allergies  Review of patient's allergies indicates no known allergies.  Home Medications   Prior to Admission medications   Medication Sig Start Date End Date Taking? Authorizing Provider  aspirin EC 81 MG tablet Take 81 mg by mouth daily.    Yes Historical Provider, MD  nitroGLYCERIN (NITROSTAT) 0.4 MG SL tablet Place 1 tablet (0.4 mg total) under the tongue every 5 (five) minutes x 3 doses as needed for chest pain. 05/03/14  Yes Thurnell Lose, MD  ondansetron (ZOFRAN ODT) 4 MG disintegrating tablet Take 1 tablet (4 mg total) by mouth every 8 (eight) hours as needed for nausea or vomiting. 09/28/14  Yes Jennifer Piepenbrink, PA-C  acetaminophen (TYLENOL) 325 MG tablet Take 2 tablets (650 mg total) by mouth every 4 (four) hours as needed for headache or mild pain. 09/09/13   [name], PA-C  atorvastatin (LIPITOR) 80 MG tablet Take 1 tablet (80 mg total) by mouth daily.  07/10/12   [name], MD  buPROPion (WELLBUTRIN SR) 150 MG 12 hr tablet Take 1 tablet (150 mg total) by mouth 2 (two) times daily. 07/10/12   [name], MD  carvedilol (COREG) 6.25 MG tablet Take 1 tablet (6.25 mg total) by mouth 2 (two) times daily with a meal. 05/03/14   Thurnell Lose, MD  famotidine (PEPCID) 20 MG tablet Take 1 tablet (20 mg total) by mouth 2 (two) times daily. 02/28/15   [name], MD  furosemide (LASIX) 20 MG tablet Take 1 tablet (20 mg total) by mouth daily. 09/09/13   [name], PA-C   HYDROcodone-acetaminophen (NORCO/VICODIN) 5-325 MG per tablet Take 1-2 tablets by mouth every 6 (six) hours as needed for severe pain. 09/28/14   Jennifer Piepenbrink, PA-C  insulin glargine (LANTUS) 100 UNIT/ML injection Inject 15-20 Units into the skin at bedtime.    Historical Provider, MD  isosorbide mononitrate (IMDUR) 30 MG 24 hr tablet Take 1 tablet (30 mg total) by mouth daily. 09/09/13   [name], PA-C  lisinopril (PRINIVIL,ZESTRIL) 5 MG tablet Take 5 mg by mouth daily.    Historical Provider, MD  meloxicam (MOBIC) 7.5 MG tablet Take 1 tablet (7.5 mg total) by mouth daily. 06/15/14   Clayton Bibles, PA-C  metFORMIN (GLUCOPHAGE) 1000 MG tablet Take 1 tablet (1,000 mg total) by mouth 2 (two) times daily with a meal. Do not restart Metformin until Wednesday am 7/2 Patient taking differently: Take 1,000 mg by mouth daily.  07/10/12   [name], MD  metoprolol succinate (TOPROL-XL) 50 MG 24 hr tablet Take 50 mg by mouth daily.    Historical Provider, MD  prasugrel (EFFIENT) 10 MG TABS tablet Take 1 tablet (10 mg total) by mouth daily. 09/09/13   Doreene Burke Kilroy, PA-C   BP 106/79 mmHg  Pulse 70  Resp 22  SpO2 95% Physical Exam  Constitutional: He is oriented to person, place, and time. He appears well-developed and well-nourished.  HENT:  Head: Normocephalic and atraumatic.  Eyes: Conjunctivae and EOM are normal. Pupils are equal, round, and reactive to light.  Neck: Normal range of motion. Neck supple.  Cardiovascular: Normal rate and regular rhythm.   Pulmonary/Chest: Effort normal and breath sounds normal.  Abdominal: Soft. Bowel sounds are normal.  Musculoskeletal: Normal range of motion.  Neurological: He is alert and oriented to person, place, and time.  Skin: Skin is warm and dry.  Psychiatric: He has a normal mood and affect. His behavior is normal.  Nursing note and vitals reviewed.   ED Course  Procedures (including critical care time) Labs Review Labs Reviewed  BASIC  METABOLIC PANEL - Abnormal; Notable for the following:    Sodium 130 (*)    Chloride 95 (*)    Glucose, Bld 450 (*)    BUN 26 (*)    Creatinine, Ser 1.61 (*)    GFR calc non Af Amer 45 (*)    GFR calc Af Amer 52 (*)    All other components within normal limits  CBC - Abnormal; Notable for the following:    WBC 16.5 (*)    RBC 5.85 (*)    All other components within normal limits  TROPONIN I    Imaging Review Dg Chest 2 View  02/28/2015  CLINICAL DATA:  Chest pain for 3 days. EXAM: CHEST  2 VIEW COMPARISON:  09/27/2014 FINDINGS: Cardiomediastinal contours are unchanged, heart with left ventricular configuration. A coronary stent is seen.  Multifocal linear opacities most consistent with atelectasis in the perihilar and right mid lung zone. Mild bronchial thickening. No consolidation. No pleural effusion or pneumothorax. No pulmonary edema. Stable osseous structures from prior. IMPRESSION: Mild bronchial thickening and multifocal linear subsegmental atelectasis. Electronically Signed   By: Jeb Levering M.D.   On: 02/28/2015 17:29   I have personally reviewed and evaluated these images and lab results as part of my medical decision-making.   EKG Interpretation   Date/Time:  Saturday February 28 2015 16:14:06 EST Ventricular Rate:  79 PR Interval:  140 QRS Duration: 89 QT Interval:  390 QTC Calculation: 447 R Axis:   -96 Text Interpretation:  Ectopic atrial rhythm Left anterior fascicular block  Anterolateral infarct, age indeterminate Confirmed by Jemuel Laursen  MD, Kealy Lewter  [phone]) on 02/28/2015 5:13:42 PM      MDM   Final diagnoses:  Chest pain, unspecified chest pain type   Although patient has cardiac risk factors, his symptoms appear to be more gastritis or esophagitis or GERD. GI cocktail and IV Protonix seemed to help. EKG and troponin showed no acute changes. Glucose is elevated. He is not in DKA. Discharge medication Pepcid. He has primary care follow-up. Discussed with  patient and his wife.    [name], MD 02/28/15 1816

## 2015-02-28 NOTE — Discharge Instructions (Signed)
Your glucose was elevated today in the 400s. Take your Lantus when you get home. Chemical  associated with a heart attack was normal. We will start a medication for your stomach and esophagus.

## 2015-02-28 NOTE — ED Notes (Signed)
Pt comes in for chest pain starting 3 days ago. Pts pain has gotten worse and moves into his stomach. Pt has been given 324 ASA by EMS and has IV establish. Per EMS pt can eat or drink and pt states he has a burning sensation "all the way down"

## 2015-05-27 ENCOUNTER — Observation Stay (HOSPITAL_COMMUNITY)
Admission: EM | Admit: 2015-05-27 | Discharge: 2015-05-28 | Disposition: A | Discharge status: Home / Self Care | Payer: Medicare Other | Attending: Internal Medicine | Admitting: Internal Medicine

## 2015-05-27 ENCOUNTER — Emergency Department (HOSPITAL_COMMUNITY): Payer: Medicare Other

## 2015-05-27 ENCOUNTER — Encounter (HOSPITAL_COMMUNITY): Payer: Self-pay

## 2015-05-27 DIAGNOSIS — R0602 Shortness of breath: Secondary | ICD-10-CM | POA: Insufficient documentation

## 2015-05-27 DIAGNOSIS — I25118 Atherosclerotic heart disease of native coronary artery with other forms of angina pectoris: Secondary | ICD-10-CM

## 2015-05-27 DIAGNOSIS — E119 Type 2 diabetes mellitus without complications: Secondary | ICD-10-CM | POA: Insufficient documentation

## 2015-05-27 DIAGNOSIS — K5900 Constipation, unspecified: Secondary | ICD-10-CM | POA: Diagnosis not present

## 2015-05-27 DIAGNOSIS — R5383 Other fatigue: Secondary | ICD-10-CM | POA: Diagnosis not present

## 2015-05-27 DIAGNOSIS — I252 Old myocardial infarction: Secondary | ICD-10-CM | POA: Insufficient documentation

## 2015-05-27 DIAGNOSIS — R21 Rash and other nonspecific skin eruption: Secondary | ICD-10-CM | POA: Insufficient documentation

## 2015-05-27 DIAGNOSIS — R079 Chest pain, unspecified: Secondary | ICD-10-CM | POA: Diagnosis not present

## 2015-05-27 DIAGNOSIS — E785 Hyperlipidemia, unspecified: Secondary | ICD-10-CM | POA: Insufficient documentation

## 2015-05-27 DIAGNOSIS — Z7984 Long term (current) use of oral hypoglycemic drugs: Secondary | ICD-10-CM | POA: Insufficient documentation

## 2015-05-27 DIAGNOSIS — R61 Generalized hyperhidrosis: Secondary | ICD-10-CM | POA: Diagnosis not present

## 2015-05-27 DIAGNOSIS — Z79899 Other long term (current) drug therapy: Secondary | ICD-10-CM | POA: Insufficient documentation

## 2015-05-27 DIAGNOSIS — R110 Nausea: Secondary | ICD-10-CM | POA: Diagnosis not present

## 2015-05-27 DIAGNOSIS — F17200 Nicotine dependence, unspecified, uncomplicated: Secondary | ICD-10-CM | POA: Diagnosis present

## 2015-05-27 DIAGNOSIS — I10 Essential (primary) hypertension: Secondary | ICD-10-CM | POA: Diagnosis not present

## 2015-05-27 DIAGNOSIS — I255 Ischemic cardiomyopathy: Secondary | ICD-10-CM

## 2015-05-27 DIAGNOSIS — Z9861 Coronary angioplasty status: Secondary | ICD-10-CM

## 2015-05-27 DIAGNOSIS — I2510 Atherosclerotic heart disease of native coronary artery without angina pectoris: Secondary | ICD-10-CM | POA: Diagnosis not present

## 2015-05-27 DIAGNOSIS — I110 Hypertensive heart disease with heart failure: Secondary | ICD-10-CM

## 2015-05-27 DIAGNOSIS — N183 Chronic kidney disease, stage 3 unspecified: Secondary | ICD-10-CM | POA: Diagnosis present

## 2015-05-27 DIAGNOSIS — E1122 Type 2 diabetes mellitus with diabetic chronic kidney disease: Secondary | ICD-10-CM | POA: Diagnosis present

## 2015-05-27 DIAGNOSIS — F17210 Nicotine dependence, cigarettes, uncomplicated: Secondary | ICD-10-CM | POA: Insufficient documentation

## 2015-05-27 DIAGNOSIS — E1149 Type 2 diabetes mellitus with other diabetic neurological complication: Secondary | ICD-10-CM | POA: Diagnosis present

## 2015-05-27 DIAGNOSIS — R0789 Other chest pain: Secondary | ICD-10-CM | POA: Diagnosis present

## 2015-05-27 DIAGNOSIS — I119 Hypertensive heart disease without heart failure: Secondary | ICD-10-CM | POA: Diagnosis present

## 2015-05-27 DIAGNOSIS — Z7982 Long term (current) use of aspirin: Secondary | ICD-10-CM | POA: Insufficient documentation

## 2015-05-27 LAB — BASIC METABOLIC PANEL
ANION GAP: 6 (ref 5–15)
BUN: 17 mg/dL (ref 6–20)
CHLORIDE: 100 mmol/L — AB (ref 101–111)
CO2: 31 mmol/L (ref 22–32)
CREATININE: 1.23 mg/dL (ref 0.61–1.24)
Calcium: 9.1 mg/dL (ref 8.9–10.3)
GFR calc non Af Amer: >60 mL/min (ref >60)
GLUCOSE: 197 mg/dL — AB (ref 65–99)
POTASSIUM: 3.8 mmol/L (ref 3.5–5.1)
Sodium: 137 mmol/L (ref 135–145)

## 2015-05-27 LAB — CBC WITH DIFFERENTIAL/PLATELET
BASOS ABS: 0 10*3/uL (ref 0.0–0.1)
BASOS PCT: 0 %
Eosinophils Absolute: 0.3 10*3/uL (ref 0.0–0.7)
Eosinophils Relative: 3 %
HEMATOCRIT: 44.7 % (ref 39.0–52.0)
HEMOGLOBIN: 14 g/dL (ref 13.0–17.0)
LYMPHS PCT: 21 %
Lymphs Abs: 2.1 10*3/uL (ref 0.7–4.0)
MCH: 25.4 pg — ABNORMAL LOW (ref 26.0–34.0)
MCHC: 31.3 g/dL (ref 30.0–36.0)
MCV: 81 fL (ref 78.0–100.0)
Monocytes Absolute: 0.4 10*3/uL (ref 0.1–1.0)
Monocytes Relative: 4 %
NEUTROS ABS: 7.2 10*3/uL (ref 1.7–7.7)
NEUTROS PCT: 72 %
Platelets: 250 10*3/uL (ref 150–400)
RBC: 5.52 MIL/uL (ref 4.22–5.81)
RDW: 15.1 % (ref 11.5–15.5)
WBC: 10 10*3/uL (ref 4.0–10.5)

## 2015-05-27 LAB — TROPONIN I
Troponin I: <0.03 ng/mL (ref <0.031)
Troponin I: <0.03 ng/mL (ref <0.031)

## 2015-05-27 LAB — GLUCOSE, CAPILLARY: GLUCOSE-CAPILLARY: 273 mg/dL — AB (ref 65–99)

## 2015-05-27 MED ORDER — MORPHINE SULFATE (PF) 2 MG/ML IV SOLN
2.0000 mg | INTRAVENOUS | Status: DC | PRN
  Administered 2015-05-27 – 2015-05-28 (×3): 2 mg via INTRAVENOUS
  Filled 2015-05-27 (×3): qty 1

## 2015-05-27 MED ORDER — INSULIN ASPART 100 UNIT/ML ~~LOC~~ SOLN
0.0000 [IU] | Freq: Every day | SUBCUTANEOUS | Status: DC
  Administered 2015-05-27: 3 [IU] via SUBCUTANEOUS

## 2015-05-27 MED ORDER — FLUCONAZOLE 100 MG PO TABS
200.0000 mg | ORAL_TABLET | Freq: Once | ORAL | Status: AC
  Administered 2015-05-27: 200 mg via ORAL
  Filled 2015-05-27: qty 2

## 2015-05-27 MED ORDER — NITROGLYCERIN 0.4 MG SL SUBL: 0.4000 mg | SUBLINGUAL_TABLET | SUBLINGUAL | Status: DC | PRN

## 2015-05-27 MED ORDER — HYDROCODONE-ACETAMINOPHEN 5-325 MG PO TABS
1.0000 | ORAL_TABLET | Freq: Four times a day (QID) | ORAL | Status: DC | PRN
  Administered 2015-05-28: 2 via ORAL
  Filled 2015-05-27 (×2): qty 2

## 2015-05-27 MED ORDER — INSULIN ASPART 100 UNIT/ML ~~LOC~~ SOLN
0.0000 [IU] | Freq: Three times a day (TID) | SUBCUTANEOUS | Status: DC
  Administered 2015-05-28: 4 [IU] via SUBCUTANEOUS
  Administered 2015-05-28: 7 [IU] via SUBCUTANEOUS

## 2015-05-27 MED ORDER — PANTOPRAZOLE SODIUM 40 MG PO TBEC
40.0000 mg | DELAYED_RELEASE_TABLET | Freq: Every day | ORAL | Status: DC
  Administered 2015-05-27 – 2015-05-28 (×2): 40 mg via ORAL
  Filled 2015-05-27 (×2): qty 1

## 2015-05-27 MED ORDER — ISOSORBIDE MONONITRATE ER 60 MG PO TB24
30.0000 mg | ORAL_TABLET | Freq: Every day | ORAL | Status: DC
  Administered 2015-05-27 – 2015-05-28 (×2): 30 mg via ORAL
  Filled 2015-05-27 (×2): qty 1

## 2015-05-27 MED ORDER — ASPIRIN 81 MG PO CHEW
324.0000 mg | CHEWABLE_TABLET | Freq: Once | ORAL | Status: AC
  Administered 2015-05-27: 324 mg via ORAL
  Filled 2015-05-27: qty 4

## 2015-05-27 MED ORDER — ACETAMINOPHEN 325 MG PO TABS: 650.0000 mg | ORAL_TABLET | ORAL | Status: DC | PRN

## 2015-05-27 MED ORDER — INSULIN ASPART 100 UNIT/ML ~~LOC~~ SOLN
6.0000 [IU] | Freq: Three times a day (TID) | SUBCUTANEOUS | Status: DC
  Administered 2015-05-28 (×2): 6 [IU] via SUBCUTANEOUS

## 2015-05-27 MED ORDER — ONDANSETRON HCL 4 MG/2ML IJ SOLN: 4.0000 mg | Freq: Four times a day (QID) | INTRAMUSCULAR | Status: DC | PRN

## 2015-05-27 MED ORDER — FUROSEMIDE 20 MG PO TABS
20.0000 mg | ORAL_TABLET | Freq: Every day | ORAL | Status: DC
  Administered 2015-05-27 – 2015-05-28 (×2): 20 mg via ORAL
  Filled 2015-05-27 (×2): qty 1

## 2015-05-27 MED ORDER — GLIPIZIDE 5 MG PO TABS
5.0000 mg | ORAL_TABLET | Freq: Two times a day (BID) | ORAL | Status: DC
  Administered 2015-05-28 (×2): 5 mg via ORAL
  Filled 2015-05-27: qty 1

## 2015-05-27 MED ORDER — GI COCKTAIL ~~LOC~~
30.0000 mL | Freq: Four times a day (QID) | ORAL | Status: DC | PRN
  Administered 2015-05-28: 30 mL via ORAL
  Filled 2015-05-27: qty 30

## 2015-05-27 MED ORDER — METOPROLOL SUCCINATE ER 25 MG PO TB24
25.0000 mg | ORAL_TABLET | Freq: Every day | ORAL | Status: DC
  Administered 2015-05-27 – 2015-05-28 (×2): 25 mg via ORAL
  Filled 2015-05-27 (×2): qty 1

## 2015-05-27 MED ORDER — INSULIN GLARGINE 100 UNIT/ML ~~LOC~~ SOLN
25.0000 [IU] | Freq: Every day | SUBCUTANEOUS | Status: DC
  Administered 2015-05-27: 25 [IU] via SUBCUTANEOUS
  Filled 2015-05-27 (×2): qty 0.25

## 2015-05-27 MED ORDER — GI COCKTAIL ~~LOC~~
30.0000 mL | Freq: Once | ORAL | Status: AC
  Administered 2015-05-27: 30 mL via ORAL
  Filled 2015-05-27: qty 30

## 2015-05-27 MED ORDER — VITAMIN D (ERGOCALCIFEROL) 1.25 MG (50000 UNIT) PO CAPS
50000.0000 [IU] | ORAL_CAPSULE | ORAL | Status: DC
  Administered 2015-05-28: 50000 [IU] via ORAL
  Filled 2015-05-27: qty 1

## 2015-05-27 MED ORDER — LISINOPRIL 5 MG PO TABS
2.5000 mg | ORAL_TABLET | Freq: Every day | ORAL | Status: DC
  Administered 2015-05-27 – 2015-05-28 (×2): 2.5 mg via ORAL
  Filled 2015-05-27 (×2): qty 1

## 2015-05-27 MED ORDER — ASPIRIN EC 81 MG PO TBEC
81.0000 mg | DELAYED_RELEASE_TABLET | Freq: Every day | ORAL | Status: DC
  Administered 2015-05-27 – 2015-05-28 (×2): 81 mg via ORAL
  Filled 2015-05-27 (×2): qty 1

## 2015-05-27 MED ORDER — GLIPIZIDE 5 MG PO TABS
ORAL_TABLET | ORAL | Status: AC
Start: 2015-05-27 — End: 2015-05-27
  Filled 2015-05-27: qty 1

## 2015-05-27 MED ORDER — PRASUGREL HCL 10 MG PO TABS
10.0000 mg | ORAL_TABLET | Freq: Every day | ORAL | Status: DC
  Administered 2015-05-27 – 2015-05-28 (×2): 10 mg via ORAL
  Filled 2015-05-27 (×3): qty 1

## 2015-05-27 MED ORDER — ATORVASTATIN CALCIUM 40 MG PO TABS
80.0000 mg | ORAL_TABLET | Freq: Every day | ORAL | Status: DC
  Administered 2015-05-27 – 2015-05-28 (×2): 80 mg via ORAL
  Filled 2015-05-27 (×2): qty 2

## 2015-05-27 NOTE — Progress Notes (Signed)
Inpatient Diabetes Program Recommendations  AACE/ADA: New Consensus Statement on Inpatient Glycemic Control (2015)  Target Ranges:  Prepandial:   less than 140 mg/dL      Peak postprandial:   less than 180 mg/dL (1-2 hours)      Critically ill patients:  140 - 180 mg/dL  Results for MOTTY, SPANO (MRN [id number]) as of 05/27/2015 13:33  Ref. Range 05/27/2015 09:56  Glucose Latest Ref Range: 65-99 mg/dL 197 (H)   Review of Glycemic Control  Diabetes history: DM2 Outpatient Diabetes medications: Glipizide 5 mg BID, Metformin 1000 mg BID, Lantus 25 units QHS Current orders for Inpatient glycemic control: None; not seen by attending MD yet  Inpatient Diabetes Program Recommendations: Correction (SSI): Patient has recently been admitted to floor and attending MD has not seen yet. Please consider ordering CBGs with Novolog correction scale ACHS. HgbA1C: Please consider ordering an A1C to evaluate glycemic control over the past 2-3 months.  Thanks, Barnie Alderman, RN, MSN, CDE Diabetes Coordinator Inpatient Diabetes Program [phone] (Team Pager from [place] to [place]) [phone] ([facility] office) [phone] [facility] office) [phone] [facility] office)

## 2015-05-27 NOTE — ED Provider Notes (Signed)
CSN: [id number]     Arrival date & time 05/27/15  [id number] History  By signing my name below, I, [name], attest that this documentation has been prepared under the direction and in the presence of Russell Dandy, MD. Electronically Signed: [name], ED Scribe. 05/27/2015. 10:34 AM.   Chief Complaint  Patient presents with  . Chest Pain   The history is provided by the patient and the spouse. No language interpreter was used.    HPI Comments: [name] is a 60 y.o. male with a PMHx of HTN, DM, CAD w/ prior angioplasty and stenting, HLDwho presents to the Emergency Department complaining of intermittent, moderate, burning, CP x 2 weeks. Pt states that his CP is similar to past MI. Pt reports he initially thought it was heartburn and took magnesium citrate and drank cold water which provided short term relief before his symptoms returned. Pt also took NTG which he states provides short term relief. Pt reports a mild alleviation of pain when pressure is applied to his upper back. He denies any other modifying factors. He reports associated nausea, intermittent diaphoresis, mild SOB and fatigue which presented with his CP. He takes metformin and reports associated constipation which he denies has acutely worsened. Pt denies leg swelling.   Pt reports a hx of yeast infections and notes a mild rash to his penile region onset in the past few days. He denies any other associated symptoms at this time.   Past Medical History  Diagnosis Date  . Hypertension   . Diabetes mellitus without complication ([name])   . Hyperlipidemia   . Coronary artery disease   . MI, old   . CAD (coronary artery disease)     STEMI-PCI   . HTN (hypertension)   . Hyperlipemia   . DM2 (diabetes mellitus, type 2) ([name])   . Tobacco use   . Obesity    Past Surgical History  Procedure Laterality Date  . Coronary angioplasty with stent placement    . Coronary angioplasty with stent placement  June 2014    LAD  DES  . Coronary angioplasty with stent placement  09/06/13     STEMI (LAD ISR)  . Coronary angiogram  09/07/13    residual RCA and OM disease  . Left heart cath Bilateral 07/08/2012    Procedure: LEFT HEART CATH;  Surgeon: [name], MD;  Location: [facility] CATH LAB;  Service: Cardiovascular;  Laterality: Bilateral;  . Percutaneous coronary stent intervention (pci-s)  07/08/2012    Procedure: PERCUTANEOUS CORONARY STENT INTERVENTION (PCI-S);  Surgeon: [name], MD;  Location: [facility] CATH LAB;  Service: Cardiovascular;;  DES LAD  . Left heart catheterization with coronary angiogram N/A 09/06/2013    Procedure: LEFT HEART CATHETERIZATION WITH CORONARY ANGIOGRAM;  Surgeon: [name], MD;  Location: [facility] CATH LAB;  Service: Cardiovascular;  Laterality: N/A;  . Percutaneous coronary stent intervention (pci-s) N/A 09/06/2013    Procedure: PERCUTANEOUS CORONARY STENT INTERVENTION (PCI-S);  Surgeon: [name], MD;  Location: [facility] CATH LAB;  Service: Cardiovascular;  Laterality: N/A;  Mid LAD 3.0/24mm Promus   No family history on file. Social History  Substance Use Topics  . Smoking status: Current Every Day Smoker    Types: Cigarettes  . Smokeless tobacco: None  . Alcohol Use: Yes     Comment: occ    Review of Systems  Constitutional: Positive for diaphoresis and fatigue.  Respiratory: Positive for shortness of breath.   Cardiovascular: Positive for  chest pain. Negative for leg swelling.  Gastrointestinal: Positive for nausea and constipation.  Skin: Positive for rash.  All other systems reviewed and are negative.   Allergies  Review of patient's allergies indicates no known allergies.  Home Medications   Prior to Admission medications   Medication Sig Start Date End Date Taking? Authorizing Provider  aspirin EC 81 MG tablet Take 81 mg by mouth daily.    Yes Historical Provider, MD  furosemide (LASIX) 20 MG tablet Take 1 tablet (20 mg total) by mouth daily.  09/09/13  Yes [name], PA-C  glipiZIDE (GLUCOTROL) 5 MG tablet Take 5 mg by mouth 2 (two) times daily.   Yes Historical Provider, MD  HYDROcodone-acetaminophen (NORCO/VICODIN) 5-325 MG per tablet Take 1-2 tablets by mouth every 6 (six) hours as needed for severe pain. 09/28/14  Yes Jennifer Piepenbrink, PA-C  isosorbide mononitrate (IMDUR) 30 MG 24 hr tablet Take 1 tablet (30 mg total) by mouth daily. 09/09/13  Yes [name], PA-C  lisinopril (PRINIVIL,ZESTRIL) 5 MG tablet Take 2.5 mg by mouth daily.    Yes Historical Provider, MD  metFORMIN (GLUCOPHAGE) 1000 MG tablet Take 1 tablet (1,000 mg total) by mouth 2 (two) times daily with a meal. Do not restart Metformin until Wednesday am 7/2 Patient taking differently: Take 1,000 mg by mouth 2 (two) times daily.  07/10/12  Yes [name], MD  metoprolol succinate (TOPROL-XL) 50 MG 24 hr tablet Take 25 mg by mouth daily.    Yes Historical Provider, MD  nitroGLYCERIN (NITROSTAT) 0.4 MG SL tablet Place 1 tablet (0.4 mg total) under the tongue every 5 (five) minutes x 3 doses as needed for chest pain. 05/03/14  Yes Thurnell Lose, MD  OVER THE COUNTER MEDICATION Apply 1 application topically as needed (for skin rashes in the legs).   Yes Historical Provider, MD  prasugrel (EFFIENT) 10 MG TABS tablet Take 1 tablet (10 mg total) by mouth daily. 09/09/13  Yes [name], PA-C  Vitamin D, Ergocalciferol, (DRISDOL) 50000 units CAPS capsule Take 50,000 Units by mouth every 7 (seven) days.   Yes Historical Provider, MD  acetaminophen (TYLENOL) 325 MG tablet Take 2 tablets (650 mg total) by mouth every 4 (four) hours as needed for headache or mild pain. 09/09/13   [name], PA-C  atorvastatin (LIPITOR) 80 MG tablet Take 1 tablet (80 mg total) by mouth daily. 07/10/12   [name], MD  buPROPion (WELLBUTRIN SR) 150 MG 12 hr tablet Take 1 tablet (150 mg total) by mouth 2 (two) times daily. 07/10/12   [name], MD  famotidine (PEPCID) 20 MG tablet  Take 1 tablet (20 mg total) by mouth 2 (two) times daily. 02/28/15   [name], MD  insulin glargine (LANTUS) 100 UNIT/ML injection Inject 15-20 Units into the skin at bedtime.    Historical Provider, MD  meloxicam (MOBIC) 7.5 MG tablet Take 1 tablet (7.5 mg total) by mouth daily. 06/15/14   Clayton Bibles, PA-C  ondansetron (ZOFRAN ODT) 4 MG disintegrating tablet Take 1 tablet (4 mg total) by mouth every 8 (eight) hours as needed for nausea or vomiting. 09/28/14   Jennifer Piepenbrink, PA-C   BP 118/82 mmHg  Pulse 73  Temp(Src) 98 F (36.7 C) (Oral)  Resp 19  Ht 5\' 9"  (1.753 m)  Wt 236 lb (107.049 kg)  BMI 34.84 kg/m2  SpO2 97% Physical Exam Nursing note and vitals reviewed. Constitutional: Well developed, well nourished, non-toxic, and  in no acute distress Head: Normocephalic and atraumatic.  Mouth/Throat: Oropharynx is clear and moist.  Neck: Normal range of motion. Neck supple.  Cardiovascular: Normal rate and regular rhythm.   Pulmonary/Chest: Effort normal and breath sounds normal.  Abdominal: Soft. There is no tenderness. There is no rebound and no guarding.  Musculoskeletal: Normal range of motion.  Neurological: Alert, no facial droop, fluent speech, moves all extremities symmetrically Skin: Skin is warm and dry.  Psychiatric: Cooperative ED Course  Procedures  DIAGNOSTIC STUDIES: Oxygen Saturation is 100% on RA, normal by my interpretation.    COORDINATION OF CARE: 10:30 AM Discussed next steps with pt. He verbalized understanding and is agreeable with the plan.   Labs Review Labs Reviewed  CBC WITH DIFFERENTIAL/PLATELET - Abnormal; Notable for the following:    MCH 25.4 (*)    All other components within normal limits  BASIC METABOLIC PANEL - Abnormal; Notable for the following:    Chloride 100 (*)    Glucose, Bld 197 (*)    All other components within normal limits  TROPONIN I    Imaging Review Dg Chest Portable 1 View  05/27/2015  CLINICAL DATA:  Chest pain and  shortness of breath. Recently diagnosed with bronchitis. EXAM: PORTABLE CHEST 1 VIEW COMPARISON:  02/28/2015 FINDINGS: The cardiac silhouette remains mildly enlarged. There is likely mild chronic bronchitic change centrally. No airspace consolidation, edema, pleural effusion, or pneumothorax is identified. No acute osseous abnormality is seen. IMPRESSION: No active disease. Electronically Signed   By: Logan Bores M.D.   On: 05/27/2015 10:11   I have personally reviewed and evaluated these images and lab results as part of my medical decision-making.   EKG Interpretation   Date/Time:  Wednesday May 27 2015 09:53:14 EDT Ventricular Rate:  69 PR Interval:  119 QRS Duration: 96 QT Interval:  410 QTC Calculation: 439 R Axis:   -57 Text Interpretation:  Sinus rhythm Borderline short PR interval Left  anterior fascicular block Extensive anterior infarct, old Abnormal T,  consider ischemia, lateral leads Baseline wander in lead(s) V1 TWI in  lateral limb and precordial leads unchanged from prior EKG  No significant  change since last tracing Confirmed by Raegan Sipp MD, Jayel Inks [id number]) on 05/27/2015  10:02:02 AM      MDM   Final diagnoses:  Burning chest pain    60 year old male with history of CAD with prior stenting, hypertension, diabetes and hyperlipidemia who presents with 2 weeks of intermittent burning chest pain. Vital signs are stable and he is well-appearing and in no acute distress. Cardiopulmonary exam unremarkable and abdomen is benign.  Part of his history is suggestive of likely GI process given that symptoms do come on after eating and with lying flat. However, he has many cardiac risk factors and part of his history also sounds potential cardiac in nature given that he does have some shortness of breath and fatigue with activity and that this is similar to prior symptoms of MI. He has chronic T-wave inversions in the lateral leads of his EKG but no acute ischemia. His first troponin is  negative. Chest x-ray shows no acute cardiopulmonary processes. I discussed with Dr. Jerilee Hoh who will admit to observation for cardiac rule out given that he is higher risk.  I personally performed the services described in this documentation, which was scribed in my presence. The recorded information has been reviewed and is accurate.   Russell Dandy, MD 05/27/15 1106

## 2015-05-27 NOTE — ED Notes (Signed)
Pt reports burning chest pain off and on x 2 weeks.  Pt says he took magnesium citrate yesterday and the pain went away for a little while but came back.  Also c/o pain between shoulder blades.

## 2015-05-27 NOTE — H&P (Signed)
History and Physical    [name] [id number] DOB: [birth year] DOA: 05/27/2015  Referring MD/NP/PA: Brantley Stage, EDP PCP: [name], MD  Patient coming from: Home  Chief Complaint: Chest pain  HPI: [name] is a 60 y.o. male with multiple medical comorbidities including coronary artery disease, hypertension, diabetes, ischemic heart disease, hyperlipidemia, tobacco abuse, morbid obesity who presents to the hospital with complaints of chest pain. He states that the chest pain began about 4 days ago. His present over the center of his chest. He notices that it is worse when he eats and he feels like if he could belch it would feel better. Denies any burning sensation in his throat. Denies any radiation, associated shortness of breath diaphoresis dizziness or palpitations. In the emergency department his initial troponin was negative, vital signs are within normal limits, lab work is essentially unremarkable, chest x-ray without active disease, EKG without acute ischemic abnormalities. We have been asked to admit him for further evaluation and management.   Past Medical History  Diagnosis Date  . Hypertension   . Diabetes mellitus without complication ([place])   . Hyperlipidemia   . Coronary artery disease   . MI, old   . CAD (coronary artery disease)     STEMI-PCI   . HTN (hypertension)   . Hyperlipemia   . DM2 (diabetes mellitus, type 2) ([place])   . Tobacco use   . Obesity     Past Surgical History  Procedure Laterality Date  . Coronary angioplasty with stent placement    . Coronary angioplasty with stent placement  June 2014    LAD DES  . Coronary angioplasty with stent placement  09/06/13     STEMI (LAD ISR)  . Coronary angiogram  09/07/13    residual RCA and OM disease  . Left heart cath Bilateral 07/08/2012    Procedure: LEFT HEART CATH;  Surgeon: [name], MD;  Location: [facility] CATH LAB;  Service: Cardiovascular;  Laterality: Bilateral;  . Percutaneous  coronary stent intervention (pci-s)  07/08/2012    Procedure: PERCUTANEOUS CORONARY STENT INTERVENTION (PCI-S);  Surgeon: [name], MD;  Location: [facility] CATH LAB;  Service: Cardiovascular;;  DES LAD  . Left heart catheterization with coronary angiogram N/A 09/06/2013    Procedure: LEFT HEART CATHETERIZATION WITH CORONARY ANGIOGRAM;  Surgeon: [name], MD;  Location: [facility] CATH LAB;  Service: Cardiovascular;  Laterality: N/A;  . Percutaneous coronary stent intervention (pci-s) N/A 09/06/2013    Procedure: PERCUTANEOUS CORONARY STENT INTERVENTION (PCI-S);  Surgeon: [name], MD;  Location: [facility] CATH LAB;  Service: Cardiovascular;  Laterality: N/A;  Mid LAD 3.0/24mm Promus     reports that he has been smoking Cigarettes.  He does not have any smokeless tobacco history on file. He reports that he drinks alcohol. He reports that he does not use illicit drugs.  No Known Allergies  Family history: History of hypertension, diabetes, heart disease in both parents  Prior to Admission medications   Medication Sig Start Date End Date Taking? Authorizing Provider  acetaminophen (TYLENOL) 325 MG tablet Take 2 tablets (650 mg total) by mouth every 4 (four) hours as needed for headache or mild pain. 09/09/13  Yes [name], PA-C  aspirin EC 81 MG tablet Take 81 mg by mouth daily.    Yes Historical Provider, MD  atorvastatin (LIPITOR) 80 MG tablet Take 1 tablet (80 mg total) by mouth daily. 07/10/12  Yes [name], MD  furosemide (LASIX) 20  MG tablet Take 1 tablet (20 mg total) by mouth daily. 09/09/13  Yes [name], PA-C  glipiZIDE (GLUCOTROL) 5 MG tablet Take 5 mg by mouth 2 (two) times daily.   Yes Historical Provider, MD  HYDROcodone-acetaminophen (NORCO/VICODIN) 5-325 MG per tablet Take 1-2 tablets by mouth every 6 (six) hours as needed for severe pain. 09/28/14  Yes Jennifer Piepenbrink, PA-C  hydrocortisone cream 0.5 % Apply 1 application topically 2 (two) times daily.    Yes Historical Provider, MD  insulin glargine (LANTUS) 100 UNIT/ML injection Inject 25 Units into the skin at bedtime.    Yes Historical Provider, MD  isosorbide mononitrate (IMDUR) 30 MG 24 hr tablet Take 1 tablet (30 mg total) by mouth daily. 09/09/13  Yes [name], PA-C  lisinopril (PRINIVIL,ZESTRIL) 5 MG tablet Take 2.5 mg by mouth daily.    Yes Historical Provider, MD  metFORMIN (GLUCOPHAGE) 1000 MG tablet Take 1 tablet (1,000 mg total) by mouth 2 (two) times daily with a meal. Do not restart Metformin until Wednesday am 7/2 Patient taking differently: Take 1,000 mg by mouth 2 (two) times daily.  07/10/12  Yes [name], MD  metoprolol succinate (TOPROL-XL) 50 MG 24 hr tablet Take 25 mg by mouth daily.    Yes Historical Provider, MD  nitroGLYCERIN (NITROSTAT) 0.4 MG SL tablet Place 1 tablet (0.4 mg total) under the tongue every 5 (five) minutes x 3 doses as needed for chest pain. 05/03/14  Yes Thurnell Lose, MD  prasugrel (EFFIENT) 10 MG TABS tablet Take 1 tablet (10 mg total) by mouth daily. 09/09/13  Yes [name], PA-C  Vitamin D, Ergocalciferol, (DRISDOL) 50000 units CAPS capsule Take 50,000 Units by mouth every 7 (seven) days.   Yes Historical Provider, MD    Review of Systems:  Constitutional: Denies fever, chills, diaphoresis, appetite change and fatigue.  HEENT: Denies photophobia, eye pain, redness, hearing loss, ear pain, congestion, sore throat, rhinorrhea, sneezing, mouth sores, trouble swallowing, neck pain, neck stiffness and tinnitus.   Respiratory: Denies SOB, DOE, cough, chest tightness,  and wheezing.   Cardiovascular: Denies palpitations and leg swelling.  Gastrointestinal: Denies nausea, vomiting, abdominal pain, diarrhea, constipation, blood in stool and abdominal distention.  Genitourinary: Denies dysuria, urgency, frequency, hematuria, flank pain and difficulty urinating.  Endocrine: Denies: hot or cold intolerance, sweats, changes in hair or nails,  polyuria, polydipsia. Musculoskeletal: Denies myalgias, back pain, joint swelling, arthralgias and gait problem.  Skin: Denies pallor, rash and wound.  Neurological: Denies dizziness, seizures, syncope, weakness, light-headedness, numbness and headaches.  Hematological: Denies adenopathy. Easy bruising, personal or family bleeding history  Psychiatric/Behavioral: Denies suicidal ideation, mood changes, confusion, nervousness, sleep disturbance and agitation    Physical Exam: Filed Vitals:   05/27/15 1215 05/27/15 1230 05/27/15 1300 05/27/15 1327  BP:  113/80 109/82 139/84  Pulse: 71 70 68 70  Temp: 98.2 F (36.8 C)  98.2 F (36.8 C) 97.7 F (36.5 C)  TempSrc: Oral  Oral Oral  Resp: 14 15 15 18   Height:    5\' 9"  (1.753 m)  Weight:      SpO2: 98% 100% 100% 100%     Constitutional: NAD, calm, comfortable Eyes: PERRL, lids and conjunctivae normal ENMT: Mucous membranes are moist. Posterior pharynx clear of any exudate or lesions.Normal dentition.  Neck: normal, supple, no masses, no thyromegaly Respiratory: clear to auscultation bilaterally, no wheezing, no crackles. Normal respiratory effort. No accessory muscle use.  Cardiovascular: Regular rate and rhythm, no murmurs /  rubs / gallops. No extremity edema. 2+ pedal pulses. No carotid bruits.  Abdomen: no tenderness, no masses palpated. No hepatosplenomegaly. Bowel sounds positive.  Musculoskeletal: no clubbing / cyanosis. No joint deformity upper and lower extremities. Good ROM, no contractures. Normal muscle tone.  Skin: no rashes, lesions, ulcers. No induration Neurologic: CN 2-12 grossly intact. Sensation intact, DTR normal. Strength 5/5 in all 4.  Psychiatric: Normal judgment and insight. Alert and oriented x 3. Normal mood.    Labs on Admission: I have personally reviewed following labs and imaging studies  CBC:  Recent Labs Lab 05/27/15 0956  WBC 10.0  NEUTROABS 7.2  HGB 14.0  HCT 44.7  MCV 81.0  PLT [id number]    Basic Metabolic Panel:  Recent Labs Lab 05/27/15 0956  NA 137  K 3.8  CL 100*  CO2 31  GLUCOSE 197*  BUN 17  CREATININE 1.23  CALCIUM 9.1   GFR: Estimated Creatinine Clearance: 77 mL/min (by C-G formula based on Cr of 1.23). Liver Function Tests: No results for input(s): AST, ALT, ALKPHOS, BILITOT, PROT, ALBUMIN in the last 168 hours. No results for input(s): LIPASE, AMYLASE in the last 168 hours. No results for input(s): AMMONIA in the last 168 hours. Coagulation Profile: No results for input(s): INR, PROTIME in the last 168 hours. Cardiac Enzymes:  Recent Labs Lab 05/27/15 0956  TROPONINI <0.03   BNP (last 3 results) No results for input(s): PROBNP in the last 8760 hours. HbA1C: No results for input(s): HGBA1C in the last 72 hours. CBG: No results for input(s): GLUCAP in the last 168 hours. Lipid Profile: No results for input(s): CHOL, HDL, LDLCALC, TRIG, CHOLHDL, LDLDIRECT in the last 72 hours. Thyroid Function Tests: No results for input(s): TSH, T4TOTAL, FREET4, T3FREE, THYROIDAB in the last 72 hours. Anemia Panel: No results for input(s): VITAMINB12, FOLATE, FERRITIN, TIBC, IRON, RETICCTPCT in the last 72 hours. Urine analysis:    Component Value Date/Time   COLORURINE YELLOW 09/27/2014 2237   APPEARANCEUR CLEAR 09/27/2014 2237   LABSPEC 1.023 09/27/2014 2237   PHURINE 5.5 09/27/2014 2237   GLUCOSEU NEGATIVE 09/27/2014 2237   HGBUR NEGATIVE 09/27/2014 2237   BILIRUBINUR SMALL* 09/27/2014 2237   KETONESUR 15* 09/27/2014 2237   PROTEINUR NEGATIVE 09/27/2014 2237   UROBILINOGEN 0.2 09/27/2014 2237   NITRITE NEGATIVE 09/27/2014 2237   LEUKOCYTESUR NEGATIVE 09/27/2014 2237   Sepsis Labs: @LABRCNTIP (procalcitonin:4,lacticidven:4) )No results found for this or any previous visit (from the past 240 hour(s)).   Radiological Exams on Admission: Dg Chest Portable 1 View  05/27/2015  CLINICAL DATA:  Chest pain and shortness of breath. Recently diagnosed  with bronchitis. EXAM: PORTABLE CHEST 1 VIEW COMPARISON:  02/28/2015 FINDINGS: The cardiac silhouette remains mildly enlarged. There is likely mild chronic bronchitic change centrally. No airspace consolidation, edema, pleural effusion, or pneumothorax is identified. No acute osseous abnormality is seen. IMPRESSION: No active disease. Electronically Signed   By: Logan Bores M.D.   On: 05/27/2015 10:11    EKG: Independently reviewed. 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 -By history, chest pain sounds more GI in origin and because of this I have added Protonix and a GI cocktail to his regimen. -Nonetheless given his history will cycle troponins, as he had residual blockages on his last cath in 2015 will request cardiology consultation as he may need an inpatient stress test prior to discharge.  Hyperlipidemia -Continue statin  Hypertension -Fair control, continue home antihypertensive agents.  Diabetes type 2 -Check hemoglobin A1c, continue Lantus and glipizide, hold metformin and place on sliding scale insulin.  Stage III chronic kidney disease -Creatinine is currently at baseline.   DVT prophylaxis: SCDs as he is already on Effient and aspirin  Code Status: Full code  Family Communication: Patient only  Disposition Plan: Likely home in 24-48 hours  Consults called: Cardiology  Admission status: Observation    Time Spent: 65  minutes  Lelon Frohlich MD [facility] Pager [phone]  If 7PM-7AM, please contact night-coverage [email or web address] Password [facility]  05/27/2015, 6:35 PM

## 2015-05-28 ENCOUNTER — Encounter (HOSPITAL_COMMUNITY): Payer: Self-pay | Admitting: Adult Health

## 2015-05-28 DIAGNOSIS — E1165 Type 2 diabetes mellitus with hyperglycemia: Secondary | ICD-10-CM

## 2015-05-28 DIAGNOSIS — R079 Chest pain, unspecified: Secondary | ICD-10-CM | POA: Diagnosis not present

## 2015-05-28 DIAGNOSIS — I255 Ischemic cardiomyopathy: Secondary | ICD-10-CM | POA: Diagnosis not present

## 2015-05-28 DIAGNOSIS — Z720 Tobacco use: Secondary | ICD-10-CM

## 2015-05-28 DIAGNOSIS — N183 Chronic kidney disease, stage 3 (moderate): Secondary | ICD-10-CM

## 2015-05-28 DIAGNOSIS — E1122 Type 2 diabetes mellitus with diabetic chronic kidney disease: Secondary | ICD-10-CM

## 2015-05-28 DIAGNOSIS — Z9861 Coronary angioplasty status: Secondary | ICD-10-CM

## 2015-05-28 DIAGNOSIS — E785 Hyperlipidemia, unspecified: Secondary | ICD-10-CM | POA: Diagnosis not present

## 2015-05-28 DIAGNOSIS — I119 Hypertensive heart disease without heart failure: Secondary | ICD-10-CM

## 2015-05-28 LAB — GLUCOSE, CAPILLARY
GLUCOSE-CAPILLARY: 199 mg/dL — AB (ref 65–99)
Glucose-Capillary: 207 mg/dL — ABNORMAL HIGH (ref 65–99)

## 2015-05-28 LAB — HEMOGLOBIN A1C
HEMOGLOBIN A1C: 11.7 % — AB (ref 4.8–5.6)
MEAN PLASMA GLUCOSE: 289 mg/dL

## 2015-05-28 LAB — TROPONIN I: Troponin I: <0.03 ng/mL (ref <0.031)

## 2015-05-28 MED ORDER — ISOSORBIDE MONONITRATE ER 60 MG PO TB24: 60.0000 mg | ORAL_TABLET | Freq: Every day | ORAL | Status: DC

## 2015-05-28 MED ORDER — NYSTATIN 100000 UNIT/GM EX POWD: Freq: Four times a day (QID) | CUTANEOUS | Status: DC

## 2015-05-28 NOTE — Care Management Note (Signed)
Case Management Note  Patient Details  Name: [name] MRN: [id number] Date of Birth: [birth year]  Subjective/Objective:                  Admitted with CP. Pt is from home, lives alone and is ind with ADL's. Pt has PCP, transportation and no difficulty affording medications. Pt discharging home today with self care.  Action/Plan: No CM needs.   Expected Discharge Date:  05/28/15               Expected Discharge Plan:  Home/Self Care  In-House Referral:  NA  Discharge planning Services  CM Consult  Post Acute Care Choice:  NA Choice offered to:  NA  DME Arranged:    DME Agency:     HH Arranged:    HH Agency:     Status of Service:  Completed, signed off  Medicare Important Message Given:    Date Medicare IM Given:    Medicare IM give by:    Date Additional Medicare IM Given:    Additional Medicare Important Message give by:     If discussed at [place] of Stay Meetings, dates discussed:    Additional Comments:  Sherald Barge, RN 05/28/2015, 1:53 PM

## 2015-05-28 NOTE — Consult Note (Signed)
The patient was seen and examined, and I agree with the history, physical exam, assessment and plan as documented above which has been discussed with Arnold Long NP, with modifications as noted below.  Pt with CAD and prior PCI admitted with chest pain. History of cocaine use as per cardiac records from 04/2014. Troponins normal. ECG with old nonspecific T wave abnormalities in lateral leads. HbA1C markedly elevated at 11.7%, indicative of poor diabetes control.  Last coronary angiogram in 08/2013 showed LAD stent patency with diffuse diabetic coronary artery disease with distal vessel disease pattern involving the distal RCA, PDA, diagonal branches, and obtuse marginal branch.  Symptoms appear GI in etiology. Needs better diabetes control and also needs to stop smoking. Does have chest pains both at rest and with exertion at home. [facility] [place] [facility] months. I will increase Imdur to 60 mg daily. No indication for noninvasive cardiovascular testing at this time.  Can follow up with cardiologist at [facility] in [place], MD, [facility]  05/28/2015 11:10 AM

## 2015-05-28 NOTE — Progress Notes (Signed)
Inpatient Diabetes Program Recommendations  AACE/ADA: New Consensus Statement on Inpatient Glycemic Control (2015)  Target Ranges:  Prepandial:   less than 140 mg/dL      Peak postprandial:   less than 180 mg/dL (1-2 hours)      Critically ill patients:  140 - 180 mg/dL   Review of Glycemic Control  Diabetes history: DM 2 Outpatient Diabetes medications: Glipizide 5 mg BID, Metformin 1000 mg BID, Lantus 25 units QHS Current orders for Inpatient glycemic control: Glipizide 5 mg BOD, Lantus 25 units QHS, Novolog Resistant TID + Novolog 6 units TID meal coverage + Novolog HS scale  Inpatient Diabetes Program Recommendations:   HgbA1C: 11.7% very poor control at home   Spoke with patient over the phone this am. Patient goes to the [facility] for DM control. Last visit was 2 months ago, increased basal insulin from 16 only when glucose is >200, to 25 units daily. Patient tried to remember his A1c at that time he thinks it was between 9-10%. Patient reports sometimes not taking his medications and is "not too fond of needles" but says his daughter and her mother can help. Spoke with patient about the importance of glucose control and circulation to his heart. Patient verbalizes understanding. Discussed with patient about taking his DM medications. Setting an alarm to remind him to take them.   Thanks,  Tama Headings RN, MSN, [facility] Inpatient Diabetes Coordinator Team Pager [phone] (8a-5p)

## 2015-05-28 NOTE — Discharge Summary (Signed)
Physician Discharge Summary  [name] [id number] DOB: 12/07/1955 DOA: 05/27/2015  PCP: [name], MD  Admit date: 05/27/2015 Discharge date: 05/28/2015  Time spent: 45 minutes  Recommendations for Outpatient Follow-up:  -Will be discharged home today. -Advised to follow up with with cardiologist and PCP at the [facility].   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 male with multiple medical comorbidities including coronary artery disease, hypertension, diabetes, ischemic heart disease, hyperlipidemia, tobacco abuse, morbid obesity who presents to the hospital with complaints of chest pain. He states that the chest pain began about 4 days ago. His present over the center of his chest. He notices that it is worse when he eats and he feels like if he could belch it would feel better. Denies any burning sensation in his throat. Denies any radiation, associated shortness of breath diaphoresis dizziness or palpitations. In the emergency department his initial troponin was negative, vital signs are within normal limits, lab work is essentially unremarkable, chest x-ray without active disease, EKG without acute ischemic abnormalities. We have been asked to admit him for further evaluation and management.  Hospital Course:   Chest pain -We'll doubt for acute coronary syndrome in the way of negative troponins and EKG that does not show  acute ischemic abnormalities. -Seen in consultation by cardiology who does not believe he requires further noninvasive cardiac testing prior to discharge. -By history, chest pain sounds more GI in origin, I have added a PPI to his regimen. -Cardiology has increased his Imdur from 30-60 mg daily.   Hyperlipidemia -Continue statin  Hypertension -Wellcontrolled, continue home medications  Type 2 diabetes mellitus -Hemoglobin A1c is elevated at 11.7 indicating poor glycemic control. -Continue to adjust diabetic regimen and outpatient setting.  Stage III chronic kidney disease -At baseline  Procedures:  None   Consultations:  Cardiology  Discharge Instructions  Discharge Instructions    Diet - low sodium heart healthy    Complete by:  As directed      Increase activity slowly    Complete by:  As directed             Medication List    TAKE these medications        acetaminophen 325 MG tablet  Commonly known as:  TYLENOL  Take 2 tablets (650 mg total) by mouth every 4 (four) hours as needed for headache or mild pain.     aspirin EC 81 MG tablet  Take 81 mg by mouth daily.     atorvastatin 80 MG tablet  Commonly known as:  LIPITOR  Take 1 tablet (80 mg total) by mouth daily.     furosemide 20 MG tablet  Commonly known as:  LASIX  Take 1 tablet (20 mg total) by mouth daily.     glipiZIDE 5 MG tablet  Commonly known as:  GLUCOTROL  Take 5  mg by mouth 2 (two) times daily.     HYDROcodone-acetaminophen 5-325 MG tablet  Commonly known as:  NORCO/VICODIN  Take 1-2 tablets by mouth every 6 (six) hours as needed for severe pain.     hydrocortisone cream 0.5 %  Apply 1 application topically 2 (two) times daily.     insulin glargine 100 UNIT/ML injection  Commonly known as:  LANTUS  Inject 25 Units into the skin at bedtime.     isosorbide mononitrate 60 MG 24 hr tablet  Commonly known as:  IMDUR  Take 1 tablet (60 mg total) by mouth daily.     lisinopril 5 MG  tablet  Commonly known as:  PRINIVIL,ZESTRIL  Take 2.5 mg by mouth daily.     metFORMIN 1000 MG tablet  Commonly known as:  GLUCOPHAGE  Take 1 tablet (1,000 mg total) by mouth 2 (two) times daily with a meal. Do not restart Metformin until Wednesday am 7/2     metoprolol succinate 50 MG 24 hr tablet  Commonly known as:  TOPROL-XL  Take 25 mg by mouth daily.     nitroGLYCERIN 0.4 MG SL tablet  Commonly known as:  NITROSTAT  Place 1 tablet (0.4 mg total) under the tongue every 5 (five) minutes x 3 doses as needed for chest pain.     prasugrel 10 MG Tabs tablet  Commonly known as:  EFFIENT  Take 1 tablet (10 mg total) by mouth daily.     Vitamin D (Ergocalciferol) 50000 units Caps capsule  Commonly known as:  DRISDOL  Take 50,000 Units by mouth every 7 (seven) days.       No Known Allergies     Follow-up Information    Follow up with CASSADA, PEGGY, MD. Schedule an appointment as soon as possible for a visit in 2 weeks.   Specialty:  Nurse Practitioner   Contact information:   [place] [address] [phone]        The results of significant diagnostics from this hospitalization (including imaging, microbiology, ancillary and laboratory) are listed below for reference.    Significant Diagnostic Studies: Dg Chest Portable 1 View  05/27/2015  CLINICAL DATA:  Chest pain and shortness of breath. Recently diagnosed with bronchitis. EXAM: PORTABLE CHEST 1 VIEW COMPARISON:  02/28/2015 FINDINGS: The cardiac silhouette remains mildly enlarged. There is likely mild chronic bronchitic change centrally. No airspace consolidation, edema, pleural effusion, or pneumothorax is identified. No acute osseous abnormality is seen. IMPRESSION: No active disease. Electronically Signed   By: Logan Bores M.D.   On: 05/27/2015 10:11    Microbiology: No results found for this or any previous visit (from the past 240 hour(s)).   Labs: Basic Metabolic Panel:  Recent  Labs Lab 05/27/15 0956  NA 137  K 3.8  CL 100*  CO2 31  GLUCOSE 197*  BUN 17  CREATININE 1.23  CALCIUM 9.1   Liver Function Tests: No results for input(s): AST, ALT, ALKPHOS, BILITOT, PROT, ALBUMIN in the last 168 hours. No results for input(s): LIPASE, AMYLASE in the last 168 hours. No results for input(s): AMMONIA in the last 168 hours. CBC:  Recent Labs Lab 05/27/15 0956  WBC 10.0  NEUTROABS 7.2  HGB 14.0  HCT 44.7  MCV 81.0  PLT 250   Cardiac Enzymes:  Recent Labs Lab 05/27/15 0956 05/27/15 2202 05/28/15 0010 05/28/15 0308  TROPONINI <0.03 <0.03 <0.03 <0.03   BNP: BNP (last 3 results)  Recent Labs  09/25/14 2007  09/27/14 2052  BNP 47.0 50.5    ProBNP (last 3 results) No results for input(s): PROBNP in the last 8760 hours.  CBG:  Recent Labs Lab 05/27/15 2153 05/28/15 0820 05/28/15 1126  GLUCAP 273* 199* 207*       Signed:  Lelon Frohlich  [facility] Pager: [phone] 05/28/2015, 12:57 PM

## 2015-05-28 NOTE — Consult Note (Signed)
CARDIOLOGY CONSULT NOTE   Patient ID: [name] MRN: [id number] DOB/AGE: 1955/11/14 60 y.o.  Admit Date: 05/27/2015 Referring Physician: [id number] Primary Physician: [name], MD Consulting Cardiologist: [name]  Primary Cardiologist:  Fransico Him MD Reason for Consultation: Chest Pain  Clinical Summary [name] is a 60 y.o.male with known history of CAD, HTN, DM and ICM, Hyperlipidemia, Polysubstance abuse, who was admitted with complaints of chest pain. Patient states is been having it for the last couple weeks usually associated with eating, feeling food being stuck as he begins to swallow with excessive amount of burping and gas. He states that he drinks a lot of carbonated drinks. He states that getting up and walking actually relieves his pain allows her to expel gas which improves his symptoms. He denies diaphoresis and dyspnea or fatigue. He states that when he received a GI cocktail earlier it helped relieve discomfort. He states the last time he felt pain was during breakfast this morning after eating.  On arrival to ER, BP 140/90 HR 75, O2 sat 99%. Afebrile. Pertinent labs -potassium is 3.8, chloride 100, creatinine 1.23 glucose elevated at 197. He was not anemic. No leukocytosis.Chest x-ray revealed no active disease. EKG revealed T wave flattening laterally with Q waves anteriorly. Troponins have been negative 3. Hemoglobin A1C 11.7. He is currently pain-free.  No Known Allergies  Medications Scheduled Medications: . aspirin EC  81 mg Oral Daily  . atorvastatin  80 mg Oral Daily  . furosemide  20 mg Oral Daily  . glipiZIDE  5 mg Oral BID  . insulin aspart  0-20 Units Subcutaneous TID WC  . insulin aspart  0-5 Units Subcutaneous QHS  . insulin aspart  6 Units Subcutaneous TID WC  . insulin glargine  25 Units Subcutaneous QHS  . isosorbide mononitrate  30 mg Oral Daily  . lisinopril  2.5 mg Oral Daily  . metoprolol succinate  25 mg Oral Daily    . pantoprazole  40 mg Oral Daily  . prasugrel  10 mg Oral Daily  . Vitamin D (Ergocalciferol)  50,000 Units Oral Q7 days    Infusions:    PRN Medications: acetaminophen, gi cocktail, HYDROcodone-acetaminophen, morphine injection, nitroGLYCERIN, ondansetron (ZOFRAN) IV   Past Medical History  Diagnosis Date  . Hypertension   . Diabetes mellitus without complication ([place])   . Hyperlipidemia   . Coronary artery disease   . MI, old   . CAD (coronary artery disease)     STEMI-PCI   . HTN (hypertension)   . Hyperlipemia   . DM2 (diabetes mellitus, type 2) ([place])   . Tobacco use   . Obesity     Past Surgical History  Procedure Laterality Date  . Coronary angioplasty with stent placement    . Coronary angioplasty with stent placement  June 2014    LAD DES  . Coronary angioplasty with stent placement  09/06/13     STEMI (LAD ISR)  . Coronary angiogram  09/07/13    residual RCA and OM disease  . Left heart cath Bilateral 07/08/2012    Procedure: LEFT HEART CATH;  Surgeon: [name], MD;  Location: [facility] CATH LAB;  Service: Cardiovascular;  Laterality: Bilateral;  . Percutaneous coronary stent intervention (pci-s)  07/08/2012    Procedure: PERCUTANEOUS CORONARY STENT INTERVENTION (PCI-S);  Surgeon: [name], MD;  Location: [facility] CATH LAB;  Service: Cardiovascular;;  DES LAD  . Left heart catheterization with coronary angiogram N/A 09/06/2013  Procedure: LEFT HEART CATHETERIZATION WITH CORONARY ANGIOGRAM;  Surgeon: [name], MD;  Location[facility] CATH LAB;  Service: Cardiovascular;  Laterality: N/A;  . Percutaneous coronary stent intervention (pci-s) N/A 09/06/2013    Procedure: PERCUTANEOUS CORONARY STENT INTERVENTION (PCI-S);  Surgeon: [name], MD;  Location: [facility] CATH LAB;  Service: Cardiovascular;  Laterality: N/A;  Mid LAD 3.0/24mm Promus    Family History  Problem Relation Age of Onset  . Hypertension Mother   . Hypertension Father   .  Hypertension Sister   . Diabetes Mother   . Diabetes Father   . Diabetes Sister     Social History [name] reports that he has been smoking Cigarettes.  He does not have any smokeless tobacco history on file. [name] reports that he drinks alcohol.  Review of Systems Complete review of systems are found to be negative unless outlined in H&P above.  Physical Examination Blood pressure 107/62, pulse 64, temperature 97.6 F (36.4 C), temperature source Oral, resp. rate 18, height 5\' 9"  (1.753 m), weight 236 lb (107.049 kg), SpO2 99 %.  Intake/Output Summary (Last 24 hours) at 05/28/15 1150 Last data filed at 05/28/15 0800  Gross per 24 hour  Intake    360 ml  Output      0 ml  Net    360 ml    Telemetry: NSR  GEN: No acute distress HEENT: Conjunctiva and lids normal, oropharynx clear with moist mucosa. Neck: Supple, no elevated JVP or carotid bruits, no thyromegaly. Lungs: Clear to auscultation, nonlabored breathing at rest. Cardiac: Regular rate and rhythm, distant heart sounds, no S3 or significant systolic murmur, no pericardial rub. Abdomen: Soft, nontender, no hepatomegaly, bowel sounds present, no guarding or rebound. Obese Extremities: No pitting edema, distal pulses 1+. Skin: Warm and dry. Musculoskeletal: No kyphosis. Neuropsychiatric: Alert and oriented x3, affect grossly appropriate.  Prior Cardiac Testing/Procedures Cardiac Cath 09/07/2013 Final Conclusions:  1. Continued LAD stent patency with TIMI-3 flow. 2. Diffuse diabetic coronary artery disease with and distal vessel disease pattern involving the distal RCA, PDA, diagonal branches, and obtuse marginal branch. 3. Elevated LVEDP, unable to quantitate LV function but normal contraction of the basal segments of the left ventricle  Echocardiogram 09/07/2013 Left ventricle: There is akinesis iof the mid and distal anterior, distal septal walls and the true apex. The cavity size was normal. There  was mild concentric hypertrophy. Systolic function was mildly to moderately reduced. The estimated ejection fraction was in the range of 40% to 45%. Doppler parameters are consistent with abnormal left ventricular relaxation (grade 1 diastolic dysfunction). There was no evidence of elevated ventricular filling pressure by Doppler parameters. - Aortic valve: Trileaflet; normal thickness leaflets. There was no regurgitation. - Aortic root: The aortic root was normal in size. - Mitral valve: There was no regurgitation. - Right atrium: The atrium was normal in size. - Tricuspid valve: There was no regurgitation. - Pulmonic valve: There was no regurgitation. - Pulmonary arteries: Systolic pressure was within the normal range. - Pericardium, extracardiac: There was no pericardial effusion. Lab Results  Basic Metabolic Panel:  Recent Labs Lab 05/27/15 0956  NA 137  K 3.8  CL 100*  CO2 31  GLUCOSE 197*  BUN 17  CREATININE 1.23  CALCIUM 9.1     CBC:  Recent Labs Lab 05/27/15 0956  WBC 10.0  NEUTROABS 7.2  HGB 14.0  HCT 44.7  MCV 81.0  PLT 250    Cardiac Enzymes:  Recent Labs Lab  05/27/15 0956 05/27/15 2202 05/28/15 0010 05/28/15 0308  TROPONINI <0.03 <0.03 <0.03 <0.03     Radiology: Dg Chest Portable 1 View  05/27/2015  CLINICAL DATA:  Chest pain and shortness of breath. Recently diagnosed with bronchitis. EXAM: PORTABLE CHEST 1 VIEW COMPARISON:  02/28/2015 FINDINGS: The cardiac silhouette remains mildly enlarged. There is likely mild chronic bronchitic change centrally. No airspace consolidation, edema, pleural effusion, or pneumothorax is identified. No acute osseous abnormality is seen. IMPRESSION: No active disease. Electronically Signed   By: Logan Bores M.D.   On: 05/27/2015 10:11     [id number] rhythm with anterior Q waves and T-wave flattening in the lateral leads. T-wave flattening is new compared to prior EKG 03/02/2015.   Impression  and Recommendations 1. Atypical chest pain: Patient states this is been going on for 2 weeks usually associated with eating, substernal with heartburn. Patient states food tends to get stuck, burping sometimes makes it better, getting up and walking makes it better, rubbing his back makes it better. Troponins are negative 3 no significant ST changes, arguing against ACS. Recommend GI evaluation and follow-up   2. Coronary artery disease: Cardiac catheterization in 2015 revealed patent LAD stent, with diffuse diabetic coronary artery disease distally. We'll continue medical management unless symptoms progress or worsen. Continue DAPT, nitroglycerin glycerin, ACE inhibitor, and metoprolol. He will need cardiology follow-up either here in [place] or in [place] as he has not been followed routinely.  3. Diabetes: Hemoglobin A1c elevated 11.7. Will need better control recommend follow-up with primary care physician for ongoing management. I  4. Ongoing tobacco abuse: Significant cardiovascular risk factor along with his multiple medical problems. He is advised on smoking cessation.  5. Polysubstance abuse: Patient had history of cocaine use along with cigarettes and alcohol. He states that he has not used cocaine in several months, and only drinks on rare occasions. To be thorough check UDS.      Signed: Phill Myron. Lawrence NP [place]  05/28/2015, 11:50 AM Co-Sign MD  The patient was seen and examined, and I agree with the history, physical exam, assessment and plan as documented above which has been discussed with Arnold Long NP, with modifications as noted below.  Pt with CAD and prior PCI admitted with chest pain. History of cocaine use as per cardiac records from 04/2014. Troponins normal. ECG with old nonspecific T wave abnormalities in lateral leads. HbA1C markedly elevated at 11.7%, indicative of poor diabetes control.  Last coronary angiogram in 08/2013 showed LAD stent patency with  diffuse diabetic coronary artery disease with distal vessel disease pattern involving the distal RCA, PDA, diagonal branches, and obtuse marginal branch.  Symptoms appear GI in etiology. Needs better diabetes control and also needs to stop smoking. Does have chest pains both at rest and with exertion at home. [facility] [place] [facility]-4 months. I will increase Imdur to 60 mg daily. No indication for noninvasive cardiovascular testing at this time.  Can follow up with cardiologist at [facility] in [place].

## 2015-05-28 NOTE — Care Management Obs Status (Signed)
[place] NOTIFICATION   Patient Details  Name: [name] MRN: [id number] Date of Birth: 01/18/1955   Medicare Observation Status Notification Given:  Yes    Sherald Barge, RN 05/28/2015, 1:49 PM

## 2015-06-29 ENCOUNTER — Encounter (HOSPITAL_COMMUNITY): Payer: Self-pay | Admitting: Emergency Medicine

## 2015-06-29 ENCOUNTER — Observation Stay (HOSPITAL_COMMUNITY)
Admission: EM | Admit: 2015-06-29 | Discharge: 2015-06-30 | Disposition: A | Discharge status: Home / Self Care | Payer: Medicare Other | Attending: Internal Medicine | Admitting: Internal Medicine

## 2015-06-29 ENCOUNTER — Other Ambulatory Visit: Payer: Self-pay

## 2015-06-29 ENCOUNTER — Emergency Department (HOSPITAL_COMMUNITY): Payer: Medicare Other

## 2015-06-29 DIAGNOSIS — Z955 Presence of coronary angioplasty implant and graft: Secondary | ICD-10-CM | POA: Diagnosis not present

## 2015-06-29 DIAGNOSIS — E1165 Type 2 diabetes mellitus with hyperglycemia: Secondary | ICD-10-CM | POA: Diagnosis not present

## 2015-06-29 DIAGNOSIS — I2510 Atherosclerotic heart disease of native coronary artery without angina pectoris: Secondary | ICD-10-CM | POA: Insufficient documentation

## 2015-06-29 DIAGNOSIS — R55 Syncope and collapse: Principal | ICD-10-CM | POA: Diagnosis present

## 2015-06-29 DIAGNOSIS — Z6834 Body mass index (BMI) 34.0-34.9, adult: Secondary | ICD-10-CM | POA: Insufficient documentation

## 2015-06-29 DIAGNOSIS — R079 Chest pain, unspecified: Secondary | ICD-10-CM | POA: Diagnosis not present

## 2015-06-29 DIAGNOSIS — I5022 Chronic systolic (congestive) heart failure: Secondary | ICD-10-CM | POA: Diagnosis present

## 2015-06-29 DIAGNOSIS — N183 Chronic kidney disease, stage 3 unspecified: Secondary | ICD-10-CM | POA: Diagnosis present

## 2015-06-29 DIAGNOSIS — Z794 Long term (current) use of insulin: Secondary | ICD-10-CM | POA: Diagnosis not present

## 2015-06-29 DIAGNOSIS — Z7984 Long term (current) use of oral hypoglycemic drugs: Secondary | ICD-10-CM | POA: Diagnosis not present

## 2015-06-29 DIAGNOSIS — I5023 Acute on chronic systolic (congestive) heart failure: Secondary | ICD-10-CM | POA: Diagnosis present

## 2015-06-29 DIAGNOSIS — R739 Hyperglycemia, unspecified: Secondary | ICD-10-CM

## 2015-06-29 DIAGNOSIS — R0602 Shortness of breath: Secondary | ICD-10-CM | POA: Insufficient documentation

## 2015-06-29 DIAGNOSIS — E861 Hypovolemia: Secondary | ICD-10-CM | POA: Diagnosis present

## 2015-06-29 DIAGNOSIS — Z79899 Other long term (current) drug therapy: Secondary | ICD-10-CM | POA: Insufficient documentation

## 2015-06-29 DIAGNOSIS — R109 Unspecified abdominal pain: Secondary | ICD-10-CM | POA: Diagnosis not present

## 2015-06-29 DIAGNOSIS — I252 Old myocardial infarction: Secondary | ICD-10-CM

## 2015-06-29 DIAGNOSIS — I10 Essential (primary) hypertension: Secondary | ICD-10-CM | POA: Diagnosis not present

## 2015-06-29 DIAGNOSIS — E785 Hyperlipidemia, unspecified: Secondary | ICD-10-CM | POA: Insufficient documentation

## 2015-06-29 DIAGNOSIS — E669 Obesity, unspecified: Secondary | ICD-10-CM | POA: Insufficient documentation

## 2015-06-29 DIAGNOSIS — E119 Type 2 diabetes mellitus without complications: Secondary | ICD-10-CM

## 2015-06-29 DIAGNOSIS — F17210 Nicotine dependence, cigarettes, uncomplicated: Secondary | ICD-10-CM | POA: Insufficient documentation

## 2015-06-29 DIAGNOSIS — E1122 Type 2 diabetes mellitus with diabetic chronic kidney disease: Secondary | ICD-10-CM | POA: Diagnosis present

## 2015-06-29 DIAGNOSIS — Z7982 Long term (current) use of aspirin: Secondary | ICD-10-CM | POA: Diagnosis not present

## 2015-06-29 DIAGNOSIS — IMO0001 Reserved for inherently not codable concepts without codable children: Secondary | ICD-10-CM

## 2015-06-29 LAB — CBC WITH DIFFERENTIAL/PLATELET
BASOS ABS: 0.1 10*3/uL (ref 0.0–0.1)
BASOS PCT: 0 %
EOS ABS: 0.4 10*3/uL (ref 0.0–0.7)
EOS PCT: 3 %
HCT: 46.6 % (ref 39.0–52.0)
Hemoglobin: 14.6 g/dL (ref 13.0–17.0)
Lymphocytes Relative: 20 %
Lymphs Abs: 2.5 10*3/uL (ref 0.7–4.0)
MCH: 25.3 pg — ABNORMAL LOW (ref 26.0–34.0)
MCHC: 31.3 g/dL (ref 30.0–36.0)
MCV: 80.8 fL (ref 78.0–100.0)
MONO ABS: 0.7 10*3/uL (ref 0.1–1.0)
Monocytes Relative: 5 %
NEUTROS ABS: 8.7 10*3/uL — AB (ref 1.7–7.7)
Neutrophils Relative %: 72 %
PLATELETS: 258 10*3/uL (ref 150–400)
RBC: 5.77 MIL/uL (ref 4.22–5.81)
RDW: 15.4 % (ref 11.5–15.5)
WBC: 12.3 10*3/uL — ABNORMAL HIGH (ref 4.0–10.5)

## 2015-06-29 LAB — BASIC METABOLIC PANEL
ANION GAP: 11 (ref 5–15)
BUN: 22 mg/dL — ABNORMAL HIGH (ref 6–20)
CALCIUM: 9 mg/dL (ref 8.9–10.3)
CO2: 26 mmol/L (ref 22–32)
Chloride: 97 mmol/L — ABNORMAL LOW (ref 101–111)
Creatinine, Ser: 2.11 mg/dL — ABNORMAL HIGH (ref 0.61–1.24)
GFR, EST AFRICAN AMERICAN: 37 mL/min — AB (ref >60)
GFR, EST NON AFRICAN AMERICAN: 32 mL/min — AB (ref >60)
Glucose, Bld: 282 mg/dL — ABNORMAL HIGH (ref 65–99)
Potassium: 3.9 mmol/L (ref 3.5–5.1)
SODIUM: 134 mmol/L — AB (ref 135–145)

## 2015-06-29 LAB — TROPONIN I: Troponin I: <0.03 ng/mL (ref <0.031)

## 2015-06-29 LAB — CBG MONITORING, ED: GLUCOSE-CAPILLARY: 318 mg/dL — AB (ref 65–99)

## 2015-06-29 MED ORDER — INSULIN GLARGINE 100 UNIT/ML ~~LOC~~ SOLN
25.0000 [IU] | Freq: Every day | SUBCUTANEOUS | Status: DC
  Administered 2015-06-29: 25 [IU] via SUBCUTANEOUS
  Filled 2015-06-29 (×2): qty 0.25

## 2015-06-29 MED ORDER — METOPROLOL SUCCINATE ER 25 MG PO TB24
25.0000 mg | ORAL_TABLET | Freq: Every day | ORAL | Status: DC
  Administered 2015-06-30: 25 mg via ORAL
  Filled 2015-06-29 (×2): qty 1

## 2015-06-29 MED ORDER — SODIUM CHLORIDE 0.9 % IV SOLN: INTRAVENOUS | Status: DC

## 2015-06-29 MED ORDER — ACETAMINOPHEN 650 MG RE SUPP: 650.0000 mg | Freq: Four times a day (QID) | RECTAL | Status: DC | PRN

## 2015-06-29 MED ORDER — PRASUGREL HCL 10 MG PO TABS
10.0000 mg | ORAL_TABLET | Freq: Every day | ORAL | Status: DC
  Administered 2015-06-30: 10 mg via ORAL
  Filled 2015-06-29 (×2): qty 1

## 2015-06-29 MED ORDER — ACETAMINOPHEN 325 MG PO TABS: 650.0000 mg | ORAL_TABLET | Freq: Four times a day (QID) | ORAL | Status: DC | PRN

## 2015-06-29 MED ORDER — ISOSORBIDE MONONITRATE ER 60 MG PO TB24
30.0000 mg | ORAL_TABLET | Freq: Every day | ORAL | Status: DC
  Administered 2015-06-30: 30 mg via ORAL
  Filled 2015-06-29: qty 1

## 2015-06-29 MED ORDER — SODIUM CHLORIDE 0.9 % IV BOLUS (SEPSIS)
1000.0000 mL | Freq: Once | INTRAVENOUS | Status: AC
  Administered 2015-06-29: 1000 mL via INTRAVENOUS

## 2015-06-29 MED ORDER — GLIPIZIDE 5 MG PO TABS
5.0000 mg | ORAL_TABLET | Freq: Two times a day (BID) | ORAL | Status: DC
  Administered 2015-06-30: 5 mg via ORAL
  Filled 2015-06-29: qty 1

## 2015-06-29 MED ORDER — ASPIRIN EC 81 MG PO TBEC
81.0000 mg | DELAYED_RELEASE_TABLET | Freq: Every day | ORAL | Status: DC
  Administered 2015-06-30: 81 mg via ORAL
  Filled 2015-06-29: qty 1

## 2015-06-29 MED ORDER — ATORVASTATIN CALCIUM 40 MG PO TABS
80.0000 mg | ORAL_TABLET | Freq: Every day | ORAL | Status: DC
Start: 2015-06-29 — End: 2015-06-30
  Administered 2015-06-29: 80 mg via ORAL
  Filled 2015-06-29: qty 2

## 2015-06-29 MED ORDER — SODIUM CHLORIDE 0.9 % IV SOLN
INTRAVENOUS | Status: AC
  Administered 2015-06-29: 19:00:00 via INTRAVENOUS

## 2015-06-29 NOTE — ED Notes (Signed)
Pt given dinner tray.

## 2015-06-29 NOTE — ED Notes (Signed)
Pt given water to drink per Dr. Christy Gentles.

## 2015-06-29 NOTE — ED Provider Notes (Signed)
CSN: [id number]     Arrival date & time 06/29/15  1428 History   First MD Initiated Contact with Patient 06/29/15 1429     Chief Complaint  Patient presents with  . Chest Pain    Patient is a 60 y.o. male presenting with near-syncope. The history is provided by the patient.  Near Syncope This is a new problem. The current episode started less than 1 hour ago. The problem occurs constantly. The problem has been gradually improving. Associated symptoms include chest pain, abdominal pain and shortness of breath. The symptoms are aggravated by standing. The symptoms are relieved by rest.  Patient with h/o HTN/diabetes/CAD who presents for near syncopal event He was here in the ER visiting a family.  He reports while waiting in the room, he felt "hot" as apparently the room was very warm.  He went out to his car and turned on air conditioner but this did not last for long He came back to the ED and still continued to feel warm He reports feeling lightheaded He also reports feeling like he might pass out He reports onset of CP/SOB, abdominal pain and back pain He had otherwise been at baseline prior to visiting family in the ED He did have some diarrhea earlier today No new meds reported  Past Medical History  Diagnosis Date  . Hypertension   . Diabetes mellitus without complication ([place])   . Hyperlipidemia   . Coronary artery disease   . MI, old   . CAD (coronary artery disease)     STEMI-PCI   . HTN (hypertension)   . Hyperlipemia   . DM2 (diabetes mellitus, type 2) ([place])   . Tobacco use   . Obesity    Past Surgical History  Procedure Laterality Date  . Coronary angioplasty with stent placement    . Coronary angioplasty with stent placement  June 2014    LAD DES  . Coronary angioplasty with stent placement  09/06/13     STEMI (LAD ISR)  . Coronary angiogram  09/07/13    residual RCA and OM disease  . Left heart cath Bilateral 07/08/2012    Procedure: LEFT HEART CATH;  Surgeon:  [name], MD;  Location: [facility] CATH LAB;  Service: Cardiovascular;  Laterality: Bilateral;  . Percutaneous coronary stent intervention (pci-s)  07/08/2012    Procedure: PERCUTANEOUS CORONARY STENT INTERVENTION (PCI-S);  Surgeon: [name], MD;  Location: [facility] CATH LAB;  Service: Cardiovascular;;  DES LAD  . Left heart catheterization with coronary angiogram N/A 09/06/2013    Procedure: LEFT HEART CATHETERIZATION WITH CORONARY ANGIOGRAM;  Surgeon: [name], MD;  Location: [facility] CATH LAB;  Service: Cardiovascular;  Laterality: N/A;  . Percutaneous coronary stent intervention (pci-s) N/A 09/06/2013    Procedure: PERCUTANEOUS CORONARY STENT INTERVENTION (PCI-S);  Surgeon: [name], MD;  Location: [facility] CATH LAB;  Service: Cardiovascular;  Laterality: N/A;  Mid LAD 3.0/24mm Promus   Family History  Problem Relation Age of Onset  . Hypertension Mother   . Hypertension Father   . Hypertension Sister   . Diabetes Mother   . Diabetes Father   . Diabetes Sister    Social History  Substance Use Topics  . Smoking status: Current Every Day Smoker    Types: Cigarettes  . Smokeless tobacco: None  . Alcohol Use: Yes     Comment: occ    Review of Systems  Constitutional: Negative for fever.  Respiratory: Positive for shortness of breath.  Cardiovascular: Positive for chest pain and near-syncope.  Gastrointestinal: Positive for abdominal pain. Negative for blood in stool.  Neurological: Positive for light-headedness.  All other systems reviewed and are negative.     Allergies  Review of patient's allergies indicates no known allergies.  Home Medications   Prior to Admission medications   Medication Sig Start Date End Date Taking? Authorizing Provider  acetaminophen (TYLENOL) 325 MG tablet Take 2 tablets (650 mg total) by mouth every 4 (four) hours as needed for headache or mild pain. 09/09/13   [name], PA-C  aspirin EC 81 MG tablet Take 81 mg by mouth  daily.     Historical Provider, MD  atorvastatin (LIPITOR) 80 MG tablet Take 1 tablet (80 mg total) by mouth daily. 07/10/12   [name], MD  furosemide (LASIX) 20 MG tablet Take 1 tablet (20 mg total) by mouth daily. 09/09/13   [name], PA-C  glipiZIDE (GLUCOTROL) 5 MG tablet Take 5 mg by mouth 2 (two) times daily.    Historical Provider, MD  HYDROcodone-acetaminophen (NORCO/VICODIN) 5-325 MG per tablet Take 1-2 tablets by mouth every 6 (six) hours as needed for severe pain. 09/28/14   Jennifer Piepenbrink, PA-C  hydrocortisone cream 0.5 % Apply 1 application topically 2 (two) times daily.    Historical Provider, MD  insulin glargine (LANTUS) 100 UNIT/ML injection Inject 25 Units into the skin at bedtime.     Historical Provider, MD  isosorbide mononitrate (IMDUR) 60 MG 24 hr tablet Take 1 tablet (60 mg total) by mouth daily. 05/28/15   Erline Hau, MD  lisinopril (PRINIVIL,ZESTRIL) 5 MG tablet Take 2.5 mg by mouth daily.     Historical Provider, MD  metFORMIN (GLUCOPHAGE) 1000 MG tablet Take 1 tablet (1,000 mg total) by mouth 2 (two) times daily with a meal. Do not restart Metformin until Wednesday am 7/2 Patient taking differently: Take 1,000 mg by mouth 2 (two) times daily.  07/10/12   [name], MD  metoprolol succinate (TOPROL-XL) 50 MG 24 hr tablet Take 25 mg by mouth daily.     Historical Provider, MD  nitroGLYCERIN (NITROSTAT) 0.4 MG SL tablet Place 1 tablet (0.4 mg total) under the tongue every 5 (five) minutes x 3 doses as needed for chest pain. 05/03/14   Thurnell Lose, MD  nystatin (MYCOSTATIN) powder Apply topically 4 (four) times daily. 05/28/15   Erline Hau, MD  prasugrel (EFFIENT) 10 MG TABS tablet Take 1 tablet (10 mg total) by mouth daily. 09/09/13   [name], PA-C  Vitamin D, Ergocalciferol, (DRISDOL) 50000 units CAPS capsule Take 50,000 Units by mouth every 7 (seven) days.    Historical Provider, MD   BP 81/67 mmHg  Pulse 81   Temp(Src) 97.8 F (36.6 C) (Oral)  Resp 22  Ht 5\' 9"  (1.753 m)  Wt 106.595 kg  BMI 34.69 kg/m2  SpO2 97% Physical Exam CONSTITUTIONAL: chronically ill appearing HEAD: Normocephalic/atraumatic EYES: EOMI/PERRL ENMT: Mucous membranes dry NECK: supple no meningeal signs SPINE/BACK:entire spine nontender CV: S1/S2 noted, no murmurs/rubs/gallops noted LUNGS: Lungs are clear to auscultation bilaterally, no apparent distress ABDOMEN: soft, nontender, no rebound or guarding, bowel sounds noted throughout abdomen GU:no cva tenderness NEURO: Pt is awake/alert/appropriate, moves all extremitiesx4.  No facial droop.   EXTREMITIES:  full ROM SKIN:pale in appearance PSYCH: no abnormalities of mood noted, alert and oriented to situation  ED Course  Procedures  2:56 PM Pt seen as soon as he  was put in room He apparently felt overheated and was placed in room and on stretcher Initial BP is low IV fluids ordered EKG is unchanged Labs pending at this time He has significant h/o coronary artery disease 3:27 PM Pt stable He is on the phone He is feeling improved He is requesting water Blood pressure improving Will need monitoring At signout to Dr Wyvonnia Dusky, f/u on labs.  I would advised 3 hour monitoring and recheck of troponin at 1740 If negative, if vitals improved and he can ambulate he would safe for discharge at that point  Labs Review Labs Reviewed  CBC WITH DIFFERENTIAL/PLATELET - Abnormal; Notable for the following:    WBC 12.3 (*)    MCH 25.3 (*)    Neutro Abs 8.7 (*)    All other components within normal limits  CBG MONITORING, ED - Abnormal; Notable for the following:    Glucose-Capillary 318 (*)    All other components within normal limits  BASIC METABOLIC PANEL  TROPONIN I    I have personally reviewed and evaluated these lab results as part of my medical decision-making.   EKG Interpretation   Date/Time:  Monday June 29 2015 14:35:33 EDT Ventricular Rate:  83 PR  Interval:    QRS Duration: 90 QT Interval:  377 QTC Calculation: 443 R Axis:   -72 Text Interpretation:  Sinus rhythm Left anterior fascicular block  Extensive anterior infarct, old Abnormal T, consider ischemia, lateral  leads No significant change since last tracing Confirmed by Christy Gentles  MD,  Toyia Jelinek ([id number]) on 06/29/2015 2:42:07 PM     Medications  sodium chloride 0.9 % bolus 1,000 mL (1,000 mLs Intravenous New Bag/Given 06/29/15 1442)    MDM   Final diagnoses:  Chest pain, unspecified chest pain type  Near syncope  Hyperglycemia    Nursing notes including past medical history and social history reviewed and considered in documentation Labs/vital reviewed myself and considered during evaluation Previous records reviewed and considered     Ripley Fraise, MD 06/29/15 1529

## 2015-06-29 NOTE — ED Notes (Signed)
Hospitalist at bedside 

## 2015-06-29 NOTE — ED Notes (Signed)
MD at bedside. 

## 2015-06-29 NOTE — ED Provider Notes (Signed)
Care assumed from Dr. Christy Gentles. Patient near-syncopal episode while visiting a family member in the ED. Found to be hypotensive. Denies chest pain, abdominal pain or back pain.  Given 1 L of fluid with mild improvement of blood pressure to 97/64. Labs show elevated creatinine negative troponin. Denies chest pain or abdominal pain.  Blood pressure improving to 105/56.  Given cardiac history and persistent hypotension, we'll admit for observation. Discussed with Dr. Jonnie Finner.  Ezequiel Essex, MD 06/29/15 1739

## 2015-06-29 NOTE — H&P (Signed)
[facility] History and Physical  [name] NTZ:[id number] DOB: 02-Dec-1955 DOA: 06/29/2015  Referring physician: Dr Wyvonnia Dusky PCP: [name], MD   Chief Complaint: Near syncope  HPI: [name] is a 60 y.o. male with history of CAD s/p stents, CM EF 45%/ hx MI x 2, HTN , IDDM, obestiy and HLD presenting to ED w presyncopal episode happened in ED while he was visiting his "old lady" in the ED at [facility].  He began to feel "hot" and lightheaded and was taken into another ED room where BP's were low at 71/54.  He rec'd I L NS and felt a little better, then got a second liter of NS and BP's are back up to normal. Denies recent CP, fever, sickness, SOB, abd pain, n/v/d.  Takes his meds at home about 1/2 the time.  Is on acei, MTP, imdur and lasix 20 mg / d.  Patient says he can't stay in a hot room or be outside if it's hot, he will become lightheaded.  HE mows the lawn early in the am to avoid hot weather. Review of old chart shows low BP's 90's- 100's last few visits in [facility]. Plan admit.    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 Went into [employer] in the late 46's x 4 yrs , got out in 1980.  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  Has 3 bros and 2 sisters, lives alone.  Was married once and is divorced.  Had 2 children with another woman and all three live about a mile up the road from him, he mows their lawn.   No etoh , tob.  His PCP is in [place], [place].  Goes to [employer] as well.    ROS  denies CP  no joint pain   no HA  no blurry vision  no rash  no diarrhea  no nausea/ vomiting  no dysuria  no difficulty voiding  no change in urine color    Past Medical History  Past Medical History  Diagnosis Date  . Hypertension   . Diabetes mellitus without complication ([place])   . Hyperlipidemia   . Coronary artery disease   . MI, old   . CAD (coronary artery disease)     STEMI-PCI   . HTN  (hypertension)   . Hyperlipemia   . DM2 (diabetes mellitus, type 2) ([place])   . Tobacco use   . Obesity    Past Surgical History  Past Surgical History  Procedure Laterality Date  . Coronary angioplasty with stent placement    . Coronary angioplasty with stent placement  June 2014    LAD DES  . Coronary angioplasty with stent placement  09/06/13     STEMI (LAD ISR)  . Coronary angiogram  09/07/13    residual RCA and OM disease  . Left heart cath Bilateral 07/08/2012    Procedure: LEFT HEART CATH;  Surgeon: [name], MD;  Location: [facility] CATH LAB;  Service: Cardiovascular;  Laterality: Bilateral;  . Percutaneous coronary stent intervention (pci-s)  07/08/2012    Procedure: PERCUTANEOUS CORONARY STENT INTERVENTION (PCI-S);  Surgeon: [name], MD;  Location: [facility] CATH LAB;  Service: Cardiovascular;;  DES LAD  . Left heart catheterization with coronary angiogram N/A 09/06/2013    Procedure: LEFT HEART CATHETERIZATION WITH CORONARY ANGIOGRAM;  Surgeon: [name], MD;  Location: [facility]  CATH LAB;  Service: Cardiovascular;  Laterality: N/A;  . Percutaneous coronary stent intervention (pci-s) N/A 09/06/2013    Procedure: PERCUTANEOUS CORONARY STENT INTERVENTION (PCI-S);  Surgeon: [name], MD;  Location: [facility] CATH LAB;  Service: Cardiovascular;  Laterality: N/A;  Mid LAD 3.0/24mm Promus   Family History  Family History  Problem Relation Age of Onset  . Hypertension Mother   . Hypertension Father   . Hypertension Sister   . Diabetes Mother   . Diabetes Father   . Diabetes Sister    Social History  reports that he has been smoking Cigarettes.  He does not have any smokeless tobacco history on file. He reports that he drinks alcohol. He reports that he does not use illicit drugs. Allergies No Known Allergies Home medications Prior to Admission medications   Medication Sig Start Date End Date Taking? Authorizing Provider  aspirin EC 81 MG tablet Take 81 mg by mouth  daily.    Yes Historical Provider, MD  HYDROcodone-acetaminophen (NORCO/VICODIN) 5-325 MG per tablet Take 1-2 tablets by mouth every 6 (six) hours as needed for severe pain. 09/28/14  Yes Jennifer Piepenbrink, PA-C  insulin glargine (LANTUS) 100 UNIT/ML injection Inject 25-35 Units into the skin at bedtime.    Yes Historical Provider, MD  nitroGLYCERIN (NITROSTAT) 0.4 MG SL tablet Place 1 tablet (0.4 mg total) under the tongue every 5 (five) minutes x 3 doses as needed for chest pain. 05/03/14  Yes Thurnell Lose, MD  Vitamin D, Ergocalciferol, (DRISDOL) 50000 units CAPS capsule Take 50,000 Units by mouth every 7 (seven) days.   Yes Historical Provider, MD  acetaminophen (TYLENOL) 325 MG tablet Take 2 tablets (650 mg total) by mouth every 4 (four) hours as needed for headache or mild pain. 09/09/13   [name], PA-C  atorvastatin (LIPITOR) 80 MG tablet Take 1 tablet (80 mg total) by mouth daily. 07/10/12   [name], MD  furosemide (LASIX) 20 MG tablet Take 1 tablet (20 mg total) by mouth daily. 09/09/13   [name], PA-C  glipiZIDE (GLUCOTROL) 5 MG tablet Take 5 mg by mouth 2 (two) times daily.    Historical Provider, MD  hydrocortisone cream 0.5 % Apply 1 application topically 2 (two) times daily.    Historical Provider, MD  isosorbide mononitrate (IMDUR) 60 MG 24 hr tablet Take 1 tablet (60 mg total) by mouth daily. 05/28/15   Erline Hau, MD  lisinopril (PRINIVIL,ZESTRIL) 5 MG tablet Take 2.5 mg by mouth daily.     Historical Provider, MD  metFORMIN (GLUCOPHAGE) 1000 MG tablet Take 1 tablet (1,000 mg total) by mouth 2 (two) times daily with a meal. Do not restart Metformin until Wednesday am 7/2 Patient taking differently: Take 1,000 mg by mouth 2 (two) times daily.  07/10/12   [name], MD  metoprolol succinate (TOPROL-XL) 50 MG 24 hr tablet Take 25 mg by mouth daily.     Historical Provider, MD  nystatin (MYCOSTATIN) powder Apply topically 4 (four) times daily. 05/28/15    Erline Hau, MD  prasugrel (EFFIENT) 10 MG TABS tablet Take 1 tablet (10 mg total) by mouth daily. 09/09/13   [name], PA-C   Liver Function Tests No results for input(s): AST, ALT, ALKPHOS, BILITOT, PROT, ALBUMIN in the last 168 hours. No results for input(s): LIPASE, AMYLASE in the last 168 hours. CBC  Recent Labs Lab 06/29/15 1449  WBC 12.3*  NEUTROABS 8.7*  HGB 14.6  HCT 46.6  MCV 80.8  PLT 068   Basic Metabolic Panel  Recent Labs Lab 06/29/15 1449  NA 134*  K 3.9  CL 97*  CO2 26  GLUCOSE 282*  BUN 22*  CREATININE 2.11*  CALCIUM 9.0     Filed Vitals:   06/29/15 1730 06/29/15 1746 06/29/15 1800 06/29/15 1815  BP: 98/74 113/78 120/78 112/87  Pulse:   81 82  Temp:      TempSrc:      Resp: '18 21 18   ' Height:      Weight:      SpO2:   100% 100%   Exam: Gen obese calm, no distress No rash, cyanosis or gangrene Sclera anicteric, throat clear  No jvd or bruits Chest clear bilat RRR no MRG Abd soft ntnd no mass or ascites +bs, obese GU normal male MS no joint effusions or deformity Ext no LE or UE edema / no wounds or ulcers Neuro is alert, Ox 3 , nf   Na 134  K 3.9  CO2 26  BUN 22 Cr 2.11  WBC 12k  Hb 14.6  plt 258 Glu 282  eGFR 37   EKG (independ reviewed) > NSR, no acute changes, extensive old ant MI w PRWP throughout the precordial leads CXR (independ reviewed) > CM, venous HTN possibly, no acute   Assessment: 1  Near syncope /hypotension- due to vol depletion and/or BP medications.  Not acutely ill, not septic or toxic.  Chronic problem per pt, doesn't take his medications regularly due to lightheadness. BP's much better after 2 L NS in ED.  Will decrease BP meds (stop lisinopril and cut Imdur by 50%) and dc lasix.  Cont MTP.  No hx of CHF on chart review.   2  CAD hx MI's/ stents - cont asa / Effient 3  HTN as above 4  DM cont meds except hold metformin 5  A/CKD - due to vol depletion/ acei  Plan - as  above     [place] D [facility] Pager [phone]  Cell [phone]  If 7PM-7AM, please contact night-coverage [email or web address] Password [facility] 06/29/2015, 6:48 PM

## 2015-06-29 NOTE — ED Notes (Signed)
Left arm sitting 112/87

## 2015-06-29 NOTE — ED Notes (Signed)
Pt c/o sudden onset cp/sob while visiting another patient. Pt appears slightly pale and diaphoretic.

## 2015-06-30 DIAGNOSIS — I5022 Chronic systolic (congestive) heart failure: Secondary | ICD-10-CM

## 2015-06-30 DIAGNOSIS — R55 Syncope and collapse: Secondary | ICD-10-CM | POA: Diagnosis not present

## 2015-06-30 DIAGNOSIS — E861 Hypovolemia: Secondary | ICD-10-CM

## 2015-06-30 DIAGNOSIS — Z955 Presence of coronary angioplasty implant and graft: Secondary | ICD-10-CM | POA: Diagnosis not present

## 2015-06-30 DIAGNOSIS — E1122 Type 2 diabetes mellitus with diabetic chronic kidney disease: Secondary | ICD-10-CM

## 2015-06-30 DIAGNOSIS — I10 Essential (primary) hypertension: Secondary | ICD-10-CM | POA: Diagnosis not present

## 2015-06-30 DIAGNOSIS — I252 Old myocardial infarction: Secondary | ICD-10-CM

## 2015-06-30 DIAGNOSIS — N183 Chronic kidney disease, stage 3 (moderate): Secondary | ICD-10-CM

## 2015-06-30 LAB — BASIC METABOLIC PANEL
Anion gap: 6 (ref 5–15)
BUN: 28 mg/dL — ABNORMAL HIGH (ref 6–20)
CHLORIDE: 103 mmol/L (ref 101–111)
CO2: 25 mmol/L (ref 22–32)
CREATININE: 1.67 mg/dL — AB (ref 0.61–1.24)
Calcium: 8.2 mg/dL — ABNORMAL LOW (ref 8.9–10.3)
GFR calc non Af Amer: 43 mL/min — ABNORMAL LOW (ref >60)
GFR, EST AFRICAN AMERICAN: 49 mL/min — AB (ref >60)
Glucose, Bld: 258 mg/dL — ABNORMAL HIGH (ref 65–99)
POTASSIUM: 4.2 mmol/L (ref 3.5–5.1)
Sodium: 134 mmol/L — ABNORMAL LOW (ref 135–145)

## 2015-06-30 NOTE — Progress Notes (Signed)
Inpatient Diabetes Program Recommendations  AACE/ADA: New Consensus Statement on Inpatient Glycemic Control (2015)  Target Ranges:  Prepandial:   less than 140 mg/dL      Peak postprandial:   less than 180 mg/dL (1-2 hours)      Critically ill patients:  140 - 180 mg/dL   Lab Results  Component Value Date   GLUCAP 318* 06/29/2015   HGBA1C 11.7* 05/27/2015    Diabetes history: Type 2 diabetes Outpatient Diabetes medications: Glipizide 5 mg bid, Lantus 25-35 units q HS Current orders for Inpatient glycemic control:  Lantus 25 units q HS, Glipizide 5 mg bid  Inpatient Diabetes Program Recommendations:     Please add Novolog moderate tid with meals and HS.  Also consider adding Novolog meal coverage 5 units tid with meals- Hold if patient eats less than 50%.    Thanks, Adah Perl, RN, BC-ADM Inpatient Diabetes Coordinator Pager [phone] (8a-5p)

## 2015-06-30 NOTE — Discharge Summary (Signed)
Physician Discharge Summary  [name] [id number] DOB: 25-Feb-1955 DOA: 06/29/2015  PCP: [name], MD  Admit date: 06/29/2015 Discharge date: 06/30/2015  Admitted From: Home Disposition:  Home  Recommendations for Outpatient Follow-up:  1. Follow up with PCP in 1-2 weeks 2. Please obtain BMP/CBC in one week 3. Please follow up on the following pending results:  Home Health: None Equipment/Devices:None  Discharge Condition: Stable CODE STATUS: Full code Diet recommendation: Heart Healthy  Brief/Interim Summary: [name] is a 60 y.o. male with history of CAD s/p stents, CM EF 45%/ hx MI x 2, HTN , IDDM, obestiy and HLD presenting to ED w presyncopal episode happened in ED while he was visiting his "old lady" in the ED at [facility]. He began to feel "hot" and lightheaded and was taken into another ED room where BP's were low at 71/54. He rec'd I L NS and felt a little better, then got a second liter of NS and BP's are back up to normal. Denies recent CP, fever, sickness, SOB, abd pain, n/v/d. Takes his meds at home about 1/2 the time. Is on acei, MTP, imdur and lasix 20 mg / d. Patient says he can't stay in a hot room or be outside if it's hot, he will become lightheaded. HE mows the lawn early in the am to avoid hot weather. Review of old chart shows low BP's 90's- 100's last few visits in [facility]. Plan admit.   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 Went into [employer] in the late 74's x 4 yrs , got out in 1980. 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 Has 3 bros and 2 sisters, lives alone. Was married once and is divorced. Had 2 children with another woman and all three live about a mile up the road from him, he mows their lawn. No etoh , tob. His PCP is in [place], [place]. Goes to [employer] as well.   Discharge Diagnoses:  Principal Problem:   Near syncope Active Problems:   Stage 3  chronic renal impairment associated with type 2 diabetes mellitus (HCC)   Essential hypertension   History of heart artery stents   Hypovolemia   IDDM (insulin dependent diabetes mellitus) ([place])   History of MI (myocardial infarction)   Chronic systolic CHF   1 Near syncope /hypotension- due to vol depletion and/or BP medications.  Not acutely ill, not septic or toxic.  Chronic problem per pt, doesn't take his medications regularly due to lightheadness.  BP's much better after 2 L NS in ED. Will decrease BP meds (stop lisinopril and cut Imdur by 50%) and dc lasix. Cont MTP. Has history of cardiomyopathy with ejection fraction of 40-45%, no acute exacerbation. Patient given IV fluids in the hospital, he is on Lasix, this discontinued. Patient reported that he will follow with his cardiologist at [facility], tomorrow 07/01/2015.  2 CAD hx MI's/ stents - cont asa / Effient 3 HTN as above 4 DM cont meds except hold metformin 5 A/CKD, baseline is 1.2-1.6, came in with creatinine of 2.1, back to 1.6 after hydration with IV fluids.   Discharge Instructions  Discharge Instructions    Diet - low sodium heart healthy    Complete by:  As directed      Increase activity slowly    Complete by:  As directed  Medication List    STOP taking these medications        furosemide 20 MG tablet  Commonly known as:  LASIX      TAKE these medications        acetaminophen 325 MG tablet  Commonly known as:  TYLENOL  Take 2 tablets (650 mg total) by mouth every 4 (four) hours as needed for headache or mild pain.     aspirin EC 81 MG tablet  Take 81 mg by mouth daily.     glipiZIDE 5 MG tablet  Commonly known as:  GLUCOTROL  Take 5 mg by mouth 2 (two) times daily.     HYDROcodone-acetaminophen 5-325 MG tablet  Commonly known as:  NORCO/VICODIN  Take 1-2 tablets by mouth every 6 (six) hours as needed for severe pain.     hydrocortisone cream 0.5 %   Apply 1 application topically 2 (two) times daily.     insulin glargine 100 UNIT/ML injection  Commonly known as:  LANTUS  Inject 25-35 Units into the skin at bedtime.     isosorbide mononitrate 60 MG 24 hr tablet  Commonly known as:  IMDUR  Take 1 tablet (60 mg total) by mouth daily.     lisinopril 5 MG tablet  Commonly known as:  PRINIVIL,ZESTRIL  Take 2.5 mg by mouth daily.     metFORMIN 1000 MG tablet  Commonly known as:  GLUCOPHAGE  Take 1 tablet (1,000 mg total) by mouth 2 (two) times daily with a meal. Do not restart Metformin until Wednesday am 7/2     metoprolol succinate 50 MG 24 hr tablet  Commonly known as:  TOPROL-XL  Take 25 mg by mouth daily.     nitroGLYCERIN 0.4 MG SL tablet  Commonly known as:  NITROSTAT  Place 1 tablet (0.4 mg total) under the tongue every 5 (five) minutes x 3 doses as needed for chest pain.     nystatin powder  Commonly known as:  MYCOSTATIN  Apply topically 4 (four) times daily.     prasugrel 10 MG Tabs tablet  Commonly known as:  EFFIENT  Take 1 tablet (10 mg total) by mouth daily.     Vitamin D (Ergocalciferol) 50000 units Caps capsule  Commonly known as:  DRISDOL  Take 50,000 Units by mouth every 7 (seven) days.           Follow-up Information    Follow up with [facility] On 07/01/2015.   Contact information:   [place] [id number] [phone]      No Known Allergies  Consultations:  None   Procedures/Studies: Dg Chest 2 View  06/29/2015  CLINICAL DATA:  Sudden onset of shortness of breath and chest tightness. Near syncope. EXAM: CHEST  2 VIEW COMPARISON:  05/27/2015 FINDINGS: The heart is mildly enlarged. The aorta is unfolded. There is mild venous hypertension but no edema. No infiltrate, collapse or effusion. Ordinary degenerative changes affect the spine. IMPRESSION: Cardiomegaly.  Possible venous hypertension.  Otherwise negative. Electronically Signed   By: Nelson Chimes M.D.   On: 06/29/2015 17:57    (Echo, Carotid, EGD, Colonoscopy, ERCP)    Subjective:   Discharge Exam:  Filed Vitals:   06/29/15 1746 06/29/15 1800 06/29/15 1815 06/29/15 2133  BP: 113/78 120/78 112/87 126/79  Pulse:  81 82 70  Temp:      TempSrc:      Resp: 21 18  18   Height:  Weight:      SpO2:  100% 100% 96%    General: Pt is alert, awake, not in acute distress Cardiovascular: RRR, S1/S2 +, no rubs, no gallops Respiratory: CTA bilaterally, no wheezing, no rhonchi Abdominal: Soft, NT, ND, bowel sounds + Extremities: no edema, no cyanosis    The results of significant diagnostics from this hospitalization (including imaging, microbiology, ancillary and laboratory) are listed below for reference.     Microbiology: No results found for this or any previous visit (from the past 240 hour(s)).   Labs: BNP (last 3 results)  Recent Labs  09/25/14 2007 09/27/14 2052  BNP 47.0 [id number]   Basic Metabolic Panel:  Recent Labs Lab 06/29/15 1449 06/30/15 0423  NA 134* 134*  K 3.9 4.2  CL 97* 103  CO2 26 25  GLUCOSE 282* 258*  BUN 22* 28*  CREATININE 2.11* 1.67*  CALCIUM 9.0 8.2*   Liver Function Tests: No results for input(s): AST, ALT, ALKPHOS, BILITOT, PROT, ALBUMIN in the last 168 hours. No results for input(s): LIPASE, AMYLASE in the last 168 hours. No results for input(s): AMMONIA in the last 168 hours. CBC:  Recent Labs Lab 06/29/15 1449  WBC 12.3*  NEUTROABS 8.7*  HGB 14.6  HCT 46.6  MCV 80.8  PLT 258   Cardiac Enzymes:  Recent Labs Lab 06/29/15 1449  TROPONINI <0.03   BNP: Invalid input(s): POCBNP CBG:  Recent Labs Lab 06/29/15 1433  GLUCAP 318*   D-Dimer No results for input(s): DDIMER in the last 72 hours. Hgb A1c No results for input(s): HGBA1C in the last 72 hours. Lipid Profile No results for input(s): CHOL, HDL, LDLCALC, TRIG, CHOLHDL, LDLDIRECT in the last 72 hours. Thyroid function studies No results for  input(s): TSH, T4TOTAL, T3FREE, THYROIDAB in the last 72 hours.  Invalid input(s): FREET3 Anemia work up No results for input(s): VITAMINB12, FOLATE, FERRITIN, TIBC, IRON, RETICCTPCT in the last 72 hours. Urinalysis    Component Value Date/Time   COLORURINE YELLOW 09/27/2014 2237   APPEARANCEUR CLEAR 09/27/2014 2237   LABSPEC 1.023 09/27/2014 2237   PHURINE 5.5 09/27/2014 2237   GLUCOSEU NEGATIVE 09/27/2014 2237   HGBUR NEGATIVE 09/27/2014 2237   BILIRUBINUR SMALL* 09/27/2014 2237   KETONESUR 15* 09/27/2014 2237   PROTEINUR NEGATIVE 09/27/2014 2237   UROBILINOGEN 0.2 09/27/2014 2237   NITRITE NEGATIVE 09/27/2014 2237   LEUKOCYTESUR NEGATIVE 09/27/2014 2237   Sepsis Labs Invalid input(s): PROCALCITONIN,  WBC,  LACTICIDVEN Microbiology No results found for this or any previous visit (from the past 240 hour(s)).   Time coordinating discharge: Over 30 minutes  SIGNED:   Birdie Hopes, MD  [facility] 06/30/2015, 10:29 AM Pager   If 7PM-7AM, please contact night-coverage [email or web address] Password [email or web address]

## 2015-06-30 NOTE — Care Management Note (Signed)
Case Management Note  Patient Details  Name: [name] MRN: [id number] Date of Birth: 15-Aug-1955  Subjective/Objective: Patient is from home alone. Reports being independent with ADL's. He is a English as a second language teacher and reports having an appointment with [facility] in the morning, he will drive himself. Has medicare/medicaid and reports no issues obtaining medications.                 Action/Plan: No CM needs identified.     Expected Discharge Date:                  Expected Discharge Plan:  Home/Self Care  In-House Referral:     Discharge planning Services  CM Consult  Post Acute Care Choice:    Choice offered to:     DME Arranged:    DME Agency:     HH Arranged:    [place] Agency:     Status of Service:  Completed, signed off  Medicare Important Message Given:    Date Medicare IM Given:    Medicare IM give by:    Date Additional Medicare IM Given:    Additional Medicare Important Message give by:     If discussed at [place] of Stay Meetings, dates discussed:    Additional Comments:  Davon Abdelaziz, Chauncey Reading, RN 06/30/2015, 11:02 AM

## 2015-06-30 NOTE — Care Management Note (Signed)
Case Management Note  Patient Details  Name: [name] MRN: [id number] Date of Birth: 1955/09/08   Expected Discharge Date:        06/30/2015          Expected Discharge Plan:     In-House Referral:     Discharge planning Services     Post Acute Care Choice:    Choice offered to:     DME Arranged:    DME Agency:     HH Arranged:    [place] Agency:     Status of Service:     Medicare Important Message Given:    Date Medicare IM Given:    Medicare IM give by:    Date Additional Medicare IM Given:    Additional Medicare Important Message give by:     If discussed at [place] of Stay Meetings, dates discussed:    Additional Comments:  Spoke with Fritz Pickerel from  [facility], to make them aware that patient is here under observation. If patient needs to be admitted, they would like to be notified for possible transfer to [place]. Will also Fax DC summary when patient discharges.   Kamyla Olejnik, Chauncey Reading, RN 06/30/2015, 9:00 AM

## 2015-06-30 NOTE — Progress Notes (Signed)
Pt's IV catheter removed and intact. Pt's IV site clean dry and intact. Discharge instructions including medications and follow up appointments were reviewed and discussed with pt's wife. Pt's wife verbalized understanding of discharge instructions including medications and follow up appointments. All questions were answered and no further questions at this time. Pt in stable condition and in no acute distress at time of discharge. Pt escorted by nurse tech.

## 2015-09-04 ENCOUNTER — Encounter (HOSPITAL_COMMUNITY): Payer: Self-pay | Admitting: Emergency Medicine

## 2015-09-04 ENCOUNTER — Emergency Department (HOSPITAL_COMMUNITY): Payer: Medicare Other

## 2015-09-04 ENCOUNTER — Inpatient Hospital Stay (HOSPITAL_COMMUNITY)
Admission: EM | Admit: 2015-09-04 | Discharge: 2015-09-08 | DRG: 246 | Disposition: A | Discharge status: Home / Self Care | Payer: Medicare Other | Attending: Internal Medicine | Admitting: Internal Medicine

## 2015-09-04 DIAGNOSIS — E669 Obesity, unspecified: Secondary | ICD-10-CM | POA: Diagnosis present

## 2015-09-04 DIAGNOSIS — N183 Chronic kidney disease, stage 3 (moderate): Secondary | ICD-10-CM | POA: Diagnosis present

## 2015-09-04 DIAGNOSIS — L02214 Cutaneous abscess of groin: Secondary | ICD-10-CM

## 2015-09-04 DIAGNOSIS — N179 Acute kidney failure, unspecified: Secondary | ICD-10-CM | POA: Diagnosis present

## 2015-09-04 DIAGNOSIS — E785 Hyperlipidemia, unspecified: Secondary | ICD-10-CM | POA: Diagnosis present

## 2015-09-04 DIAGNOSIS — R55 Syncope and collapse: Secondary | ICD-10-CM | POA: Diagnosis not present

## 2015-09-04 DIAGNOSIS — I252 Old myocardial infarction: Secondary | ICD-10-CM

## 2015-09-04 DIAGNOSIS — I119 Hypertensive heart disease without heart failure: Secondary | ICD-10-CM | POA: Diagnosis present

## 2015-09-04 DIAGNOSIS — Z794 Long term (current) use of insulin: Secondary | ICD-10-CM

## 2015-09-04 DIAGNOSIS — F17210 Nicotine dependence, cigarettes, uncomplicated: Secondary | ICD-10-CM | POA: Diagnosis present

## 2015-09-04 DIAGNOSIS — I10 Essential (primary) hypertension: Secondary | ICD-10-CM | POA: Diagnosis present

## 2015-09-04 DIAGNOSIS — I5022 Chronic systolic (congestive) heart failure: Secondary | ICD-10-CM | POA: Diagnosis present

## 2015-09-04 DIAGNOSIS — E1149 Type 2 diabetes mellitus with other diabetic neurological complication: Secondary | ICD-10-CM | POA: Diagnosis present

## 2015-09-04 DIAGNOSIS — Z6836 Body mass index (BMI) 36.0-36.9, adult: Secondary | ICD-10-CM

## 2015-09-04 DIAGNOSIS — E1122 Type 2 diabetes mellitus with diabetic chronic kidney disease: Secondary | ICD-10-CM | POA: Diagnosis present

## 2015-09-04 DIAGNOSIS — T82867A Thrombosis of cardiac prosthetic devices, implants and grafts, initial encounter: Secondary | ICD-10-CM | POA: Diagnosis not present

## 2015-09-04 DIAGNOSIS — E1140 Type 2 diabetes mellitus with diabetic neuropathy, unspecified: Secondary | ICD-10-CM | POA: Diagnosis present

## 2015-09-04 DIAGNOSIS — I214 Non-ST elevation (NSTEMI) myocardial infarction: Secondary | ICD-10-CM | POA: Diagnosis present

## 2015-09-04 DIAGNOSIS — I340 Nonrheumatic mitral (valve) insufficiency: Secondary | ICD-10-CM | POA: Diagnosis present

## 2015-09-04 DIAGNOSIS — F1410 Cocaine abuse, uncomplicated: Secondary | ICD-10-CM | POA: Diagnosis present

## 2015-09-04 DIAGNOSIS — Z7984 Long term (current) use of oral hypoglycemic drugs: Secondary | ICD-10-CM

## 2015-09-04 DIAGNOSIS — E1165 Type 2 diabetes mellitus with hyperglycemia: Secondary | ICD-10-CM | POA: Diagnosis present

## 2015-09-04 DIAGNOSIS — Z79899 Other long term (current) drug therapy: Secondary | ICD-10-CM

## 2015-09-04 DIAGNOSIS — R0789 Other chest pain: Secondary | ICD-10-CM | POA: Diagnosis present

## 2015-09-04 DIAGNOSIS — I2510 Atherosclerotic heart disease of native coronary artery without angina pectoris: Secondary | ICD-10-CM | POA: Diagnosis not present

## 2015-09-04 DIAGNOSIS — R071 Chest pain on breathing: Secondary | ICD-10-CM | POA: Diagnosis not present

## 2015-09-04 DIAGNOSIS — I130 Hypertensive heart and chronic kidney disease with heart failure and stage 1 through stage 4 chronic kidney disease, or unspecified chronic kidney disease: Secondary | ICD-10-CM | POA: Diagnosis present

## 2015-09-04 DIAGNOSIS — R079 Chest pain, unspecified: Secondary | ICD-10-CM | POA: Diagnosis not present

## 2015-09-04 DIAGNOSIS — F17200 Nicotine dependence, unspecified, uncomplicated: Secondary | ICD-10-CM | POA: Diagnosis present

## 2015-09-04 DIAGNOSIS — Z9114 Patient's other noncompliance with medication regimen: Secondary | ICD-10-CM

## 2015-09-04 DIAGNOSIS — E1121 Type 2 diabetes mellitus with diabetic nephropathy: Secondary | ICD-10-CM | POA: Diagnosis present

## 2015-09-04 DIAGNOSIS — I472 Ventricular tachycardia: Secondary | ICD-10-CM | POA: Diagnosis present

## 2015-09-04 DIAGNOSIS — I255 Ischemic cardiomyopathy: Secondary | ICD-10-CM | POA: Diagnosis present

## 2015-09-04 DIAGNOSIS — Z955 Presence of coronary angioplasty implant and graft: Secondary | ICD-10-CM

## 2015-09-04 DIAGNOSIS — F1490 Cocaine use, unspecified, uncomplicated: Secondary | ICD-10-CM

## 2015-09-04 DIAGNOSIS — Y831 Surgical operation with implant of artificial internal device as the cause of abnormal reaction of the patient, or of later complication, without mention of misadventure at the time of the procedure: Secondary | ICD-10-CM | POA: Diagnosis present

## 2015-09-04 DIAGNOSIS — Z7982 Long term (current) use of aspirin: Secondary | ICD-10-CM

## 2015-09-04 LAB — CBC WITH DIFFERENTIAL/PLATELET
BASOS PCT: 0 %
Basophils Absolute: 0 10*3/uL (ref 0.0–0.1)
EOS ABS: 0.1 10*3/uL (ref 0.0–0.7)
EOS PCT: 0 %
HCT: 47.2 % (ref 39.0–52.0)
HEMOGLOBIN: 15.7 g/dL (ref 13.0–17.0)
Lymphocytes Relative: 9 %
Lymphs Abs: 1.6 10*3/uL (ref 0.7–4.0)
MCH: 26.6 pg (ref 26.0–34.0)
MCHC: 33.3 g/dL (ref 30.0–36.0)
MCV: 80 fL (ref 78.0–100.0)
MONOS PCT: 4 %
Monocytes Absolute: 0.6 10*3/uL (ref 0.1–1.0)
NEUTROS PCT: 87 %
Neutro Abs: 15.1 10*3/uL — ABNORMAL HIGH (ref 1.7–7.7)
PLATELETS: 268 10*3/uL (ref 150–400)
RBC: 5.9 MIL/uL — AB (ref 4.22–5.81)
RDW: 15.5 % (ref 11.5–15.5)
WBC: 17.4 10*3/uL — AB (ref 4.0–10.5)

## 2015-09-04 LAB — COMPREHENSIVE METABOLIC PANEL
ALBUMIN: 3.8 g/dL (ref 3.5–5.0)
ALT: 28 U/L (ref 17–63)
ANION GAP: 11 (ref 5–15)
AST: 21 U/L (ref 15–41)
Alkaline Phosphatase: 89 U/L (ref 38–126)
BUN: 22 mg/dL — ABNORMAL HIGH (ref 6–20)
CALCIUM: 9.3 mg/dL (ref 8.9–10.3)
CHLORIDE: 97 mmol/L — AB (ref 101–111)
CO2: 24 mmol/L (ref 22–32)
Creatinine, Ser: 1.72 mg/dL — ABNORMAL HIGH (ref 0.61–1.24)
GFR calc non Af Amer: 41 mL/min — ABNORMAL LOW (ref >60)
GFR, EST AFRICAN AMERICAN: 48 mL/min — AB (ref >60)
GLUCOSE: 434 mg/dL — AB (ref 65–99)
Potassium: 4.5 mmol/L (ref 3.5–5.1)
SODIUM: 132 mmol/L — AB (ref 135–145)
Total Bilirubin: 0.7 mg/dL (ref 0.3–1.2)
Total Protein: 7.9 g/dL (ref 6.5–8.1)

## 2015-09-04 LAB — GLUCOSE, CAPILLARY: GLUCOSE-CAPILLARY: 234 mg/dL — AB (ref 65–99)

## 2015-09-04 LAB — TROPONIN I
TROPONIN I: 10.77 ng/mL — AB (ref <0.03)
TROPONIN I: 11.18 ng/mL — AB (ref <0.03)
Troponin I: <0.03 ng/mL (ref <0.03)

## 2015-09-04 LAB — CBG MONITORING, ED: GLUCOSE-CAPILLARY: 314 mg/dL — AB (ref 65–99)

## 2015-09-04 MED ORDER — MORPHINE SULFATE (PF) 2 MG/ML IV SOLN
1.0000 mg | INTRAVENOUS | Status: DC | PRN
  Administered 2015-09-05 – 2015-09-08 (×4): 1 mg via INTRAVENOUS
  Filled 2015-09-04 (×4): qty 1

## 2015-09-04 MED ORDER — INSULIN ASPART 100 UNIT/ML ~~LOC~~ SOLN
0.0000 [IU] | Freq: Every day | SUBCUTANEOUS | Status: DC
  Administered 2015-09-04: 2 [IU] via SUBCUTANEOUS

## 2015-09-04 MED ORDER — ISOSORBIDE MONONITRATE ER 60 MG PO TB24: 60.0000 mg | ORAL_TABLET | Freq: Every day | ORAL | Status: DC

## 2015-09-04 MED ORDER — ACETAMINOPHEN 325 MG PO TABS: 650.0000 mg | ORAL_TABLET | ORAL | Status: DC | PRN

## 2015-09-04 MED ORDER — ONDANSETRON HCL 4 MG/2ML IJ SOLN
4.0000 mg | Freq: Once | INTRAMUSCULAR | Status: AC
  Administered 2015-09-04: 4 mg via INTRAVENOUS
  Filled 2015-09-04: qty 2

## 2015-09-04 MED ORDER — INSULIN GLARGINE 100 UNIT/ML ~~LOC~~ SOLN
30.0000 [IU] | Freq: Every day | SUBCUTANEOUS | Status: DC
  Administered 2015-09-04 – 2015-09-05 (×2): 30 [IU] via SUBCUTANEOUS
  Administered 2015-09-06: 15 [IU] via SUBCUTANEOUS
  Administered 2015-09-07: 30 [IU] via SUBCUTANEOUS
  Filled 2015-09-04 (×9): qty 0.3

## 2015-09-04 MED ORDER — INSULIN ASPART 100 UNIT/ML ~~LOC~~ SOLN
4.0000 [IU] | Freq: Three times a day (TID) | SUBCUTANEOUS | Status: DC
  Administered 2015-09-05 – 2015-09-08 (×7): 4 [IU] via SUBCUTANEOUS

## 2015-09-04 MED ORDER — GLIPIZIDE 5 MG PO TABS
5.0000 mg | ORAL_TABLET | Freq: Two times a day (BID) | ORAL | Status: DC
  Administered 2015-09-05 – 2015-09-08 (×4): 5 mg via ORAL
  Filled 2015-09-04 (×5): qty 1

## 2015-09-04 MED ORDER — MORPHINE SULFATE (PF) 4 MG/ML IV SOLN
4.0000 mg | Freq: Once | INTRAVENOUS | Status: AC
Start: 2015-09-04 — End: 2015-09-04
  Administered 2015-09-04: 4 mg via INTRAVENOUS
  Filled 2015-09-04: qty 1

## 2015-09-04 MED ORDER — ONDANSETRON HCL 4 MG/2ML IJ SOLN
4.0000 mg | Freq: Four times a day (QID) | INTRAMUSCULAR | Status: DC | PRN
  Administered 2015-09-07: 17:00:00 4 mg via INTRAVENOUS
  Filled 2015-09-04: qty 2

## 2015-09-04 MED ORDER — HEPARIN BOLUS VIA INFUSION
4000.0000 [IU] | Freq: Once | INTRAVENOUS | Status: AC
  Administered 2015-09-04: 4000 [IU] via INTRAVENOUS

## 2015-09-04 MED ORDER — NITROGLYCERIN 0.4 MG SL SUBL
0.4000 mg | SUBLINGUAL_TABLET | SUBLINGUAL | Status: DC | PRN
  Filled 2015-09-04: qty 1

## 2015-09-04 MED ORDER — ROSUVASTATIN CALCIUM 10 MG PO TABS
5.0000 mg | ORAL_TABLET | Freq: Every day | ORAL | Status: DC
  Administered 2015-09-05: 5 mg via ORAL
  Filled 2015-09-04: qty 1

## 2015-09-04 MED ORDER — HEPARIN (PORCINE) IN NACL 100-0.45 UNIT/ML-% IJ SOLN
1550.0000 [IU]/h | INTRAMUSCULAR | Status: DC
  Administered 2015-09-04: 1000 [IU]/h via INTRAVENOUS
  Administered 2015-09-05: 1550 [IU]/h via INTRAVENOUS
  Administered 2015-09-05: 1200 [IU]/h via INTRAVENOUS
  Administered 2015-09-06 – 2015-09-07 (×2): 1550 [IU]/h via INTRAVENOUS
  Filled 2015-09-04 (×5): qty 250

## 2015-09-04 MED ORDER — LISINOPRIL 2.5 MG PO TABS: 2.5000 mg | ORAL_TABLET | Freq: Every day | ORAL | Status: DC

## 2015-09-04 MED ORDER — MORPHINE SULFATE (PF) 4 MG/ML IV SOLN
4.0000 mg | Freq: Once | INTRAVENOUS | Status: AC
  Administered 2015-09-04: 4 mg via INTRAVENOUS
  Filled 2015-09-04: qty 1

## 2015-09-04 MED ORDER — INSULIN ASPART 100 UNIT/ML ~~LOC~~ SOLN
0.0000 [IU] | Freq: Three times a day (TID) | SUBCUTANEOUS | Status: DC
  Administered 2015-09-05 (×2): 5 [IU] via SUBCUTANEOUS
  Administered 2015-09-05: 3 [IU] via SUBCUTANEOUS
  Administered 2015-09-06: 4 [IU] via SUBCUTANEOUS
  Administered 2015-09-06: 3 [IU] via SUBCUTANEOUS
  Administered 2015-09-06: 5 [IU] via SUBCUTANEOUS
  Administered 2015-09-07: 19:00:00 2 [IU] via SUBCUTANEOUS
  Administered 2015-09-07: 5 [IU] via SUBCUTANEOUS
  Administered 2015-09-08: 3 [IU] via SUBCUTANEOUS
  Administered 2015-09-08: 07:00:00 2 [IU] via SUBCUTANEOUS

## 2015-09-04 MED ORDER — SULFAMETHOXAZOLE-TRIMETHOPRIM 800-160 MG PO TABS
1.0000 | ORAL_TABLET | Freq: Once | ORAL | Status: AC
  Administered 2015-09-04: 1 via ORAL
  Filled 2015-09-04: qty 1

## 2015-09-04 MED ORDER — VITAMIN D (ERGOCALCIFEROL) 1.25 MG (50000 UNIT) PO CAPS
50000.0000 [IU] | ORAL_CAPSULE | ORAL | Status: DC
  Administered 2015-09-05: 50000 [IU] via ORAL
  Filled 2015-09-04: qty 1

## 2015-09-04 MED ORDER — PRASUGREL HCL 10 MG PO TABS
10.0000 mg | ORAL_TABLET | Freq: Every day | ORAL | Status: DC
  Administered 2015-09-05 – 2015-09-08 (×4): 10 mg via ORAL
  Filled 2015-09-04 (×8): qty 1

## 2015-09-04 MED ORDER — ASPIRIN EC 81 MG PO TBEC
81.0000 mg | DELAYED_RELEASE_TABLET | Freq: Every day | ORAL | Status: DC
  Administered 2015-09-05 – 2015-09-08 (×3): 81 mg via ORAL
  Filled 2015-09-04 (×3): qty 1

## 2015-09-04 MED ORDER — ASPIRIN 81 MG PO CHEW
324.0000 mg | CHEWABLE_TABLET | Freq: Once | ORAL | Status: AC
  Administered 2015-09-04: 324 mg via ORAL
  Filled 2015-09-04: qty 4

## 2015-09-04 MED ORDER — SODIUM CHLORIDE 0.9 % IV BOLUS (SEPSIS)
500.0000 mL | Freq: Once | INTRAVENOUS | Status: AC
  Administered 2015-09-04: 500 mL via INTRAVENOUS

## 2015-09-04 NOTE — ED Notes (Signed)
hospitalist at bedside

## 2015-09-04 NOTE — Consult Note (Signed)
CARDIOLOGY CONSULT NOTE   Patient ID: [name] MRN: [id number] DOB/: October 19, 1955 60 y.o.  Admit Date: 09/04/2015 Referring Physician: [facility] Primary Physician: [name], MD Consulting Cardiologist: Dorris Carnes MD Primary Cardiologist [facility] Reason for Consultation: Syncope, chest pain, hypotension  Clinical Summary [name] is a 60 y.o.male   with known history of coronary artery disease, coronary artery stenting, cardiomyopathy with EF of 45% per recent study. Hypertension, diabetes, hyperlipidemia, polysubstance abuse with cocaine use within the last 5 hours, who presented to the emergency room after having had a near syncopal episode after visiting a friend in their home. Stating that the home was very hot and he became very lightheaded and "my blood pressure dropped" making me feel is a 5. past out. He states he left and one sat in his car and turned the air conditioner on. He began to notice left-sided chest pain radiation down the left arm. As result of this he presented to the emergency room.  He admits to smoking cocaine from approximately 11 PM last night 8 AM this morning, the patient has a history of cocaine use. He also smokes heavily and drinks occasionally. He has had other episodes of near-syncope and syncope, with frequent ER visits for same.  On arrival to the emergency room the patient's blood pressure was 92/71, heart rate 88, O2 sat 96% he was afebrile. Pertinent labs revealed elevated white blood cells of 17.4, red blood cells 5.90. He was not found to be anemic. Sodium 132, potassium 4.5, chloride 97, glucose 434, creatinine 1.72 with a BUN of 22. Initial troponin less than 0.03. Chest x-ray did not reveal any acute cardiopulmonary disease.  He was treated with IV sodium chloride bolus, began to have vomiting, given Zofran, he was started on IV heparin, and given morphine for pain. He continues to have some chest pressure. He is also complaining of  pain in his left groin, describing them as "boils" that had been bleeding and painful sometimes he scratches them. He states that when he uses the bathroom recently when he stood up blood came from one of the papules.   No Known Allergies  Medications Scheduled Medications:     Infusions: . heparin 1,000 Units/hr (09/04/15 1227)     PRN Medications:     Past Medical History:  Diagnosis Date  . CAD (coronary artery disease)    STEMI-PCI   . Coronary artery disease   . Diabetes mellitus without complication ([place])   . DM2 (diabetes mellitus, type 2) ([place])   . HTN (hypertension)   . Hyperlipemia   . Hyperlipidemia   . Hypertension   . MI, old   . Obesity   . Tobacco use     Past Surgical History:  Procedure Laterality Date  . CORONARY ANGIOGRAM  09/07/13   residual RCA and OM disease  . CORONARY ANGIOPLASTY WITH STENT PLACEMENT    . CORONARY ANGIOPLASTY WITH STENT PLACEMENT  June 2014   LAD DES  . CORONARY ANGIOPLASTY WITH STENT PLACEMENT  09/06/13    STEMI (LAD ISR)  . LEFT HEART CATH Bilateral 07/08/2012   Procedure: LEFT HEART CATH;  Surgeon: [name], MD;  Location: [facility] CATH LAB;  Service: Cardiovascular;  Laterality: Bilateral;  . LEFT HEART CATHETERIZATION WITH CORONARY ANGIOGRAM N/A 09/06/2013   Procedure: LEFT HEART CATHETERIZATION WITH CORONARY ANGIOGRAM;  Surgeon: [name], MD;  Location: [facility] CATH LAB;  Service: Cardiovascular;  Laterality: N/A;  . PERCUTANEOUS CORONARY STENT INTERVENTION (PCI-S)  07/08/2012   Procedure: PERCUTANEOUS CORONARY STENT INTERVENTION (PCI-S);  Surgeon: [name], MD;  Location: [facility] CATH LAB;  Service: Cardiovascular;;  DES LAD  . PERCUTANEOUS CORONARY STENT INTERVENTION (PCI-S) N/A 09/06/2013   Procedure: PERCUTANEOUS CORONARY STENT INTERVENTION (PCI-S);  Surgeon: [name], MD;  Location: [facility] CATH LAB;  Service: Cardiovascular;  Laterality: N/A;  Mid LAD 3.0/24mm Promus    Family History  Problem  Relation Age of Onset  . Hypertension Mother   . Diabetes Mother   . Hypertension Father   . Diabetes Father   . Hypertension Sister   . Diabetes Sister      Social History [name] reports that he has been smoking Cigarettes.  He has been smoking about 0.50 packs per day. He has never used smokeless tobacco. [name] reports that he drinks alcohol.  Review of Systems Complete review of systems are found to be negative unless outlined in H&P above.  Physical Examination Blood pressure (!) 126/102, pulse 73, temperature 97.6 F (36.4 C), temperature source Oral, resp. rate 21, height 5\' 9"  (1.753 m), weight 240 lb (108.9 kg), SpO2 98 %. No intake or output data in the 24 hours ending 09/04/15 1250  Telemetry:Normal sinus rhythm in the 70s and 80s  GEN: no acute distress currently. Periods of nausea. HEENT: Conjunctiva and lids normal, oropharynx clear with moist mucosa. obese. Neck: Supple, no elevated JVP or carotid bruits, no thyromegaly. Lungs: Clear to auscultation, nonlabored breathing at rest. Cardiac: Regular rate and rhythm, distant heart sounds, no S3 or significant systolic murmur, no pericardial rub. Abdomen: Soft, nontender, no hepatomegaly, bowel sounds present, no guarding or rebound. Extremities: No pitting edema, distal pulses 2+. left groin reveals papules, open blisters and sores that are bleeding, he has one open sore on the mid pubis area proximal to his penis. Skin: Warm and dry. Musculoskeletal: No kyphosis. lipoma at the base of the neck and between the scapula, tender to touch. Neuropsychiatric: Alert and oriented x3, affect grossly appropriate.  Prior Cardiac Testing/Procedures Echocardiogram 09/07/2013 Left ventricle: There is akinesis iof the mid and distal anterior, distal septal walls and the true apex. The cavity size was normal. There was mild concentric hypertrophy. Systolic function was mildly to moderately reduced. The estimated  ejection fraction was in the range of 40% to 45%. Doppler parameters are consistent with abnormal left ventricular relaxation (grade 1 diastolic dysfunction). There was no evidence of elevated ventricular filling pressure by Doppler parameters. - Aortic valve: Trileaflet; normal thickness leaflets. There was no regurgitation. - Aortic root: The aortic root was normal in size. - Mitral valve: There was no regurgitation. - Right atrium: The atrium was normal in size. - Tricuspid valve: There was no regurgitation. - Pulmonic valve: There was no regurgitation. - Pulmonary arteries: Systolic pressure was within the normal range. - Pericardium, extracardiac: There was no pericardial effusion.  Cardiac Cath 09/07/2013 Final Conclusions:  1. Continued LAD stent patency with TIMI-3 flow. 2. Diffuse diabetic coronary artery disease with and distal vessel disease pattern involving the distal RCA, PDA, diagonal branches, and obtuse marginal branch. 3. Elevated LVEDP, unable to quantitate LV function but normal contraction of the basal segments of the left ventricle  Lab Results  Basic Metabolic Panel:  Recent Labs Lab 09/04/15 1002  NA 132*  K 4.5  CL 97*  CO2 24  GLUCOSE 434*  BUN 22*  CREATININE 1.72*  CALCIUM 9.3    Liver Function Tests:  Recent Labs Lab 09/04/15 1002  AST 21  ALT 28  ALKPHOS 89  BILITOT 0.7  PROT 7.9  ALBUMIN 3.8    CBC:  Recent Labs Lab 09/04/15 1002  WBC 17.4*  NEUTROABS 15.1*  HGB 15.7  HCT 47.2  MCV 80.0  PLT 268    Cardiac Enzymes:  Recent Labs Lab 09/04/15 1002  TROPONINI <0.03  d will resolve the rash.  I got a year  Radiology: Dg Chest 2 View  Result Date: 09/04/2015 CLINICAL DATA:  CHEST PAIN JUST LEFT TO CENTER AND SOB SINCE THIS AM, HX OF HEART STENTS, LAST ONE APPROX 1 YEAR AGO EXAM: CHEST - 2 VIEW COMPARISON:  06/29/2015 FINDINGS: Linear scarring or subsegmental atelectasis in the lingula seen best on the  lateral projection, chronic. Lungs otherwise clear. Heart size upper limits normal for technique. No pneumothorax or effusion. Chronic widening of bilateral acromioclavicular joints. IMPRESSION: No acute cardiopulmonary disease. Electronically Signed   By: Lucrezia Europe M.D.   On: 09/04/2015 11:05     ECG: with T-wave inversion in inferior leads and abnormality in the lateral leads without evidence of acute ACS.    Impression and Recommendations:  1. Near-syncope:  Patient was hypotensive on admission to ER concerns for ventricular arrhythmia associated with cocaine use. Initial troponin is negative EKG is negative for acute ST-T wave changes. Blood pressure is better with IV fluid boluses.   2. History of coronary artery disease: Noncompliant with follow-up as an medication. Patient has known history of stents to the LAD. Patient's was started on heparin in the setting of chest pain. Chest pain likely related to recent smoking of crack cocaine. Has not been started on beta blocker at this time. No anti-hypertensives due to hypotension playing. We'll repeat echocardiogram to evaluate for LV function degradation. Continue medical management for now. No plans to send for catheterization currently  3. Polysubstance abuse: Admits to tobacco use alcohol, and has smoked crack cocaine from 11 PM to 8 AM this morning. We'll need to watch closely for withdrawal. He is advised on immediate cessation and told quite frankly that this if he continued this it would cause his death.  4. Left groin papules with bleeding: Cannot rule out herpes zoster versus infective process. To be followed by hospitalist. He does have leukocytosis.    Signed: Phill Myron. Lawrence NP [place]  09/04/2015, 12:50 PM Co-Sign MD  Pt seen and examined  I agree with findings as noted by Arnold Long above  Pt is a 60 yo male with history of severe CAD  Presents with near syncpe and CP  All in setting of recent crack cocaine use this  AM EKG without acute changes  On exam pt says he is having 7/10 pain Lungs CTA  Cardiac RRR  No murmurs  No S3  Ext without edema  Mild rash on R arm by IV  Pt has as noted on cath report signif CAD in setting of DM  Not amenable to intervention in past  Ischemia has prbably been exacerbated by cocaine  Rec heparin, NTG, MSO4  Counselled on cessation of drugs. I would not recomm invasive work up at present    Would repeat BP  Check orthostatics tomorrow when hydrated  Pt admits to not drinking much  Findings not unexpected in setting of DM.  Dorris Carnes

## 2015-09-04 NOTE — H&P (Addendum)
History and Physical    [name] [id number] DOB: [birth year] DOA: 09/04/2015  Referring MD/NP/PA: Dr. Maryan Rued, [place] PCP: [name], MD  Patient coming from: Home  Chief Complaint: chest pain  HPI: [name] is a 60 y.o. male with known history of coronary artery disease, coronary artery stenting, cardiomyopathy with EF of 45% per recent study. Hypertension, diabetes, hyperlipidemia, polysubstance abuse with cocaine use within the last 5 hours, who presented to the emergency room after having had a near syncopal episode after visiting a friend in their home. Stating that the home was very hot and he became very lightheaded and "my blood pressure dropped making me feel like I was about to pass out. He states he left and one sat in his car and turned the air conditioner on. He began to notice left-sided chest pain radiation down the left arm. As result of this he presented to the emergency room.  He admits to smoking cocaine from approximately 11 PM last night until 8 AM this morning, the patient has a history of cocaine use. He also smokes heavily and drinks occasionally. He has had other episodes of near-syncope and syncope, with frequent ER visits for same. He is non-compliant with his medications and states it has been at least 2 weeks since he last took any medications.  On arrival to the emergency room the patient's blood pressure was 92/71, heart rate 88, O2 sat 96% he was afebrile. Pertinent labs revealed elevated white blood cells of 17.4, red blood cells 5.90. He was not found to be anemic. Sodium 132, potassium 4.5, chloride 97, glucose 434, creatinine 1.72 with a BUN of 22. Initial troponin less than 0.03. Chest x-ray did not reveal any acute cardiopulmonary disease.  Past Medical/Surgical History: Past Medical History:  Diagnosis Date  . CAD (coronary artery disease)    STEMI-PCI   . Coronary artery disease   . Diabetes mellitus without complication ([place])   .  DM2 (diabetes mellitus, type 2) ([place])   . HTN (hypertension)   . Hyperlipemia   . Hyperlipidemia   . Hypertension   . MI, old   . Obesity   . Tobacco use     Past Surgical History:  Procedure Laterality Date  . CORONARY ANGIOGRAM  09/07/13   residual RCA and OM disease  . CORONARY ANGIOPLASTY WITH STENT PLACEMENT    . CORONARY ANGIOPLASTY WITH STENT PLACEMENT  June 2014   LAD DES  . CORONARY ANGIOPLASTY WITH STENT PLACEMENT  09/06/13    STEMI (LAD ISR)  . LEFT HEART CATH Bilateral 07/08/2012   Procedure: LEFT HEART CATH;  Surgeon: [name], MD;  Location: [facility] CATH LAB;  Service: Cardiovascular;  Laterality: Bilateral;  . LEFT HEART CATHETERIZATION WITH CORONARY ANGIOGRAM N/A 09/06/2013   Procedure: LEFT HEART CATHETERIZATION WITH CORONARY ANGIOGRAM;  Surgeon: [name], MD;  Location: [facility] CATH LAB;  Service: Cardiovascular;  Laterality: N/A;  . PERCUTANEOUS CORONARY STENT INTERVENTION (PCI-S)  07/08/2012   Procedure: PERCUTANEOUS CORONARY STENT INTERVENTION (PCI-S);  Surgeon: [name], MD;  Location: [facility] CATH LAB;  Service: Cardiovascular;;  DES LAD  . PERCUTANEOUS CORONARY STENT INTERVENTION (PCI-S) N/A 09/06/2013   Procedure: PERCUTANEOUS CORONARY STENT INTERVENTION (PCI-S);  Surgeon: [name], MD;  Location: [facility] CATH LAB;  Service: Cardiovascular;  Laterality: N/A;  Mid LAD 3.0/24mm Promus    Social History:  reports that he has been smoking Cigarettes.  He has been smoking about 0.50 packs per day. He has  never used smokeless tobacco. He reports that he drinks alcohol. He reports that he does not use drugs.  Allergies: No Known Allergies  Family History:  Family History  Problem Relation Age of Onset  . Hypertension Mother   . Diabetes Mother   . Hypertension Father   . Diabetes Father   . Hypertension Sister   . Diabetes Sister     Prior to Admission medications   Medication Sig Start Date End Date Taking? Authorizing Provider    acetaminophen (TYLENOL) 325 MG tablet Take 2 tablets (650 mg total) by mouth every 4 (four) hours as needed for headache or mild pain. 09/09/13  Yes [name], PA-C  insulin glargine (LANTUS) 100 UNIT/ML injection Inject 25-35 Units into the skin at bedtime.    Yes Historical Provider, MD  metFORMIN (GLUCOPHAGE) 1000 MG tablet Take 1 tablet (1,000 mg total) by mouth 2 (two) times daily with a meal. Do not restart Metformin until Wednesday am 7/2 Patient taking differently: Take 1,000 mg by mouth 2 (two) times daily.  07/10/12  Yes [name], MD  nitroGLYCERIN (NITROSTAT) 0.4 MG SL tablet Place 1 tablet (0.4 mg total) under the tongue every 5 (five) minutes x 3 doses as needed for chest pain. 05/03/14  Yes Thurnell Lose, MD  aspirin EC 81 MG tablet Take 81 mg by mouth daily.     Historical Provider, MD  glipiZIDE (GLUCOTROL) 5 MG tablet Take 5 mg by mouth 2 (two) times daily.    Historical Provider, MD  hydrocortisone cream 0.5 % Apply 1 application topically 2 (two) times daily.    Historical Provider, MD  isosorbide mononitrate (IMDUR) 60 MG 24 hr tablet Take 1 tablet (60 mg total) by mouth daily. Patient not taking: Reported on 09/04/2015 05/28/15   Erline Hau, MD  lisinopril (PRINIVIL,ZESTRIL) 5 MG tablet Take 2.5 mg by mouth daily.     Historical Provider, MD  metoprolol succinate (TOPROL-XL) 50 MG 24 hr tablet Take 25 mg by mouth daily.     Historical Provider, MD  prasugrel (EFFIENT) 10 MG TABS tablet Take 1 tablet (10 mg total) by mouth daily. Patient not taking: Reported on 09/04/2015 09/09/13   [name], PA-C  Vitamin D, Ergocalciferol, (DRISDOL) 50000 units CAPS capsule Take 50,000 Units by mouth every 7 (seven) days.    Historical Provider, MD    Review of Systems:  Constitutional: Denies fever, chills, diaphoresis, appetite change and fatigue.  HEENT: Denies photophobia, eye pain, redness, hearing loss, ear pain, congestion, sore throat, rhinorrhea, sneezing,  mouth sores, trouble swallowing, neck pain, neck stiffness and tinnitus.   Respiratory: Denies SOB, DOE, cough, chest tightness,  and wheezing.   Cardiovascular: Denies chest pain, palpitations and leg swelling.  Gastrointestinal: Denies nausea, vomiting, abdominal pain, diarrhea, constipation, blood in stool and abdominal distention.  Genitourinary: Denies dysuria, urgency, frequency, hematuria, flank pain and difficulty urinating.  Endocrine: Denies: hot or cold intolerance, sweats, changes in hair or nails, polyuria, polydipsia. Musculoskeletal: Denies myalgias, back pain, joint swelling, arthralgias and gait problem.  Skin: Denies pallor, rash and wound.  Neurological: Denies dizziness, seizures, syncope, weakness, light-headedness, numbness and headaches.  Hematological: Denies adenopathy. Easy bruising, personal or family bleeding history  Psychiatric/Behavioral: Denies suicidal ideation, mood changes, confusion, nervousness, sleep disturbance and agitation    Physical Exam: Vitals:   09/04/15 1130 09/04/15 1214 09/04/15 1230 09/04/15 1330  BP: 128/90 110/82 (!) 126/102 112/92  Pulse: 74 72 73 72  Resp: 17  21 21 24   Temp:      TempSrc:      SpO2: 98% 100% 98% 97%  Weight:      Height:         Constitutional: NAD, calm, obese Eyes: PERRL, lids and conjunctivae normal ENMT: Mucous membranes are moist. Posterior pharynx clear of any exudate or lesions.Poor dentition.  Neck: normal, supple, no masses, no thyromegaly Respiratory: clear to auscultation bilaterally, no wheezing, no crackles. Normal respiratory effort. No accessory muscle use.  Cardiovascular: Regular rate and rhythm, no murmurs / rubs / gallops.1+ lower extremity edema. 2+ pedal pulses. No carotid bruits.  Abdomen: no tenderness, no masses palpated. No hepatosplenomegaly. Bowel sounds positive.  Musculoskeletal: no clubbing / cyanosis. No joint deformity upper and lower extremities. Good ROM, no contractures. Normal  muscle tone.  Skin: no rashes, lesions, ulcers. No induration Neurologic: CN 2-12 grossly intact. Sensation intact, DTR normal. Strength 5/5 in all 4.  Psychiatric: Normal judgment and insight. Alert and oriented x 3. Normal mood.    Labs on Admission: I have personally reviewed the following labs and imaging studies  CBC:  Recent Labs Lab 09/04/15 1002  WBC 17.4*  NEUTROABS 15.1*  HGB 15.7  HCT 47.2  MCV 80.0  PLT [id number]   Basic Metabolic Panel:  Recent Labs Lab 09/04/15 1002  NA 132*  K 4.5  CL 97*  CO2 24  GLUCOSE 434*  BUN 22*  CREATININE 1.72*  CALCIUM 9.3   GFR: Estimated Creatinine Clearance: 54.9 mL/min (by C-G formula based on SCr of 1.72 mg/dL). Liver Function Tests:  Recent Labs Lab 09/04/15 1002  AST 21  ALT 28  ALKPHOS 89  BILITOT 0.7  PROT 7.9  ALBUMIN 3.8   No results for input(s): LIPASE, AMYLASE in the last 168 hours. No results for input(s): AMMONIA in the last 168 hours. Coagulation Profile: No results for input(s): INR, PROTIME in the last 168 hours. Cardiac Enzymes:  Recent Labs Lab 09/04/15 1002  TROPONINI <0.03   BNP (last 3 results) No results for input(s): PROBNP in the last 8760 hours. HbA1C: No results for input(s): HGBA1C in the last 72 hours. CBG:  Recent Labs Lab 09/04/15 1404  GLUCAP 314*   Lipid Profile: No results for input(s): CHOL, HDL, LDLCALC, TRIG, CHOLHDL, LDLDIRECT in the last 72 hours. Thyroid Function Tests: No results for input(s): TSH, T4TOTAL, FREET4, T3FREE, THYROIDAB in the last 72 hours. Anemia Panel: No results for input(s): VITAMINB12, FOLATE, FERRITIN, TIBC, IRON, RETICCTPCT in the last 72 hours. Urine analysis:    Component Value Date/Time   COLORURINE YELLOW 09/27/2014 2237   APPEARANCEUR CLEAR 09/27/2014 2237   LABSPEC 1.023 09/27/2014 2237   PHURINE 5.5 09/27/2014 2237   GLUCOSEU NEGATIVE 09/27/2014 2237   HGBUR NEGATIVE 09/27/2014 2237   BILIRUBINUR SMALL (A) 09/27/2014 2237    KETONESUR 15 (A) 09/27/2014 2237   PROTEINUR NEGATIVE 09/27/2014 2237   UROBILINOGEN 0.2 09/27/2014 2237   NITRITE NEGATIVE 09/27/2014 2237   LEUKOCYTESUR NEGATIVE 09/27/2014 2237   Sepsis Labs: @LABRCNTIP (procalcitonin:4,lacticidven:4) )No results found for this or any previous visit (from the past 240 hour(s)).   Radiological Exams on Admission: Dg Chest 2 View  Result Date: 09/04/2015 CLINICAL DATA:  CHEST PAIN JUST LEFT TO CENTER AND SOB SINCE THIS AM, HX OF HEART STENTS, LAST ONE APPROX 1 YEAR AGO EXAM: CHEST - 2 VIEW COMPARISON:  06/29/2015 FINDINGS: Linear scarring or subsegmental atelectasis in the lingula seen best on the lateral projection, chronic. Lungs otherwise clear. Heart  size upper limits normal for technique. No pneumothorax or effusion. Chronic widening of bilateral acromioclavicular joints. IMPRESSION: No acute cardiopulmonary disease. Electronically Signed   By: Lucrezia Europe M.D.   On: 09/04/2015 11:05    EKG: Independently reviewed. 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 Harrington Challenger. Given his non-compliance and cocaine use, plan to treat medically at this time. -If rules out, ok to DC heparin. -Patient instructed on importance of compliance with medications and cessation of cocaine use. -Cycle troponins, monitor overnight on telemetry. -CP likely related to cocaine-induced vasospasm. -Hold BB given active cocaine use. On ACE-I/ASA/statin/effient/heparin.  HTN -Well controlled. -Continue home meds.  HLD -Not on statin. -Start crestor.  DM -Check A1C. -Continue home dose of lantus. -Start SSI.  Cocaine Use -Counseled on  cessation.    DVT prophylaxis: heparin  Code Status: full code  Family Communication: patient only  Disposition Plan: likely home in 24-48 hours  Consults called: cardiology  Admission status: observation    Time Spent: 55 minutes  Lelon Frohlich MD [facility] Pager [phone]  If 7PM-7AM, please contact night-coverage [email or web address] Password [facility]  09/04/2015, 4:31 PM

## 2015-09-04 NOTE — Progress Notes (Signed)
Call received from [facility]. Lynch, reporting message was received regarding Critical Troponin level.

## 2015-09-04 NOTE — Progress Notes (Signed)
Pt. States"My chest pain is completely gone now."

## 2015-09-04 NOTE — Progress Notes (Signed)
ANTICOAGULATION CONSULT NOTE - Initial Consult  Pharmacy Consult for Heparin Indication: chest pain/ACS  No Known Allergies  Patient Measurements: Height: 5\' 9"  (175.3 cm) Weight: 240 lb (108.9 kg) IBW/kg (Calculated) : 70.7 HEPARIN DW (KG): 94.5  Vital Signs: Temp: 97.6 F (36.4 C) (08/25 0959) Temp Source: Oral (08/25 0959) BP: 112/92 (08/25 1330) Pulse Rate: 72 (08/25 1330)  Labs:  Recent Labs  09/04/15 1002  HGB 15.7  HCT 47.2  PLT 268  CREATININE 1.72*  TROPONINI <0.03    Estimated Creatinine Clearance: 54.9 mL/min (by C-G formula based on SCr of 1.72 mg/dL).   Medical History: Past Medical History:  Diagnosis Date  . CAD (coronary artery disease)    STEMI-PCI   . Coronary artery disease   . Diabetes mellitus without complication ([place])   . DM2 (diabetes mellitus, type 2) ([place])   . HTN (hypertension)   . Hyperlipemia   . Hyperlipidemia   . Hypertension   . MI, old   . Obesity   . Tobacco use     Medications:  Prescriptions Prior to Admission  Medication Sig Dispense Refill Last Dose  . acetaminophen (TYLENOL) 325 MG tablet Take 2 tablets (650 mg total) by mouth every 4 (four) hours as needed for headache or mild pain.   unknown  . insulin glargine (LANTUS) 100 UNIT/ML injection Inject 25-35 Units into the skin at bedtime.    Past Week at Unknown time  . metFORMIN (GLUCOPHAGE) 1000 MG tablet Take 1 tablet (1,000 mg total) by mouth 2 (two) times daily with a meal. Do not restart Metformin until Wednesday am 7/2 (Patient taking differently: Take 1,000 mg by mouth 2 (two) times daily. )   Past Week at Unknown time  . nitroGLYCERIN (NITROSTAT) 0.4 MG SL tablet Place 1 tablet (0.4 mg total) under the tongue every 5 (five) minutes x 3 doses as needed for chest pain. 25 tablet 12 unknown  . aspirin EC 81 MG tablet Take 81 mg by mouth daily.    06/29/2015 at Unknown time  . glipiZIDE (GLUCOTROL) 5 MG tablet Take 5 mg by mouth 2 (two) times daily.   Past Week at  Unknown time  . hydrocortisone cream 0.5 % Apply 1 application topically 2 (two) times daily.   unknown  . isosorbide mononitrate (IMDUR) 60 MG 24 hr tablet Take 1 tablet (60 mg total) by mouth daily. (Patient not taking: Reported on 09/04/2015) 30 tablet 2 Not Taking at Unknown time  . lisinopril (PRINIVIL,ZESTRIL) 5 MG tablet Take 2.5 mg by mouth daily.    Past Week at Unknown time  . metoprolol succinate (TOPROL-XL) 50 MG 24 hr tablet Take 25 mg by mouth daily.    Past Week at Unknown time  . prasugrel (EFFIENT) 10 MG TABS tablet Take 1 tablet (10 mg total) by mouth daily. (Patient not taking: Reported on 09/04/2015) 30 tablet 11 Not Taking at Unknown time  . Vitamin D, Ergocalciferol, (DRISDOL) 50000 units CAPS capsule Take 50,000 Units by mouth every 7 (seven) days.   Past Month at Unknown time    Assessment: 60 yo male with h/o CAD s/p stents place last year, DM HTN, HLD, MI and obesity. Patient presents to ED with centralized Chest pain. Also, has SOB and mild, diffuse abdominal pain. Patient is a smoker and smoked crack cocaine last night. EKG and repeat EKG showed T-wave inversion that is unchanged. Start heparin for ACS.  Goal of Therapy:  Heparin level 0.3-0.7 units/ml Monitor platelets by anticoagulation  protocol: Yes   Plan:  Give 4000 units given in ED units bolus x 1 Start heparin infusion at 1200 units/hr Check anti-Xa level in 6 hours and daily while on heparin Continue to monitor H&H and platelets   Isac Sarna, BS [place], BCPS Clinical Pharmacist Pager [phone] 09/04/2015,3:04 PM

## 2015-09-04 NOTE — Progress Notes (Signed)
Troponin has returned at 10.77. Case discussed with Dr. Harrington Challenger. Will transfer patient to [facility] given cardiology services are unavailable at [facility] over the weekend. Cardiology will follow to determine if any need for cardiac catheterization.  Domingo Mend, MD [facility] Pager: [phone]

## 2015-09-04 NOTE — Progress Notes (Signed)
**Note De-Identified Wiatt Mahabir Obfuscation** EKG completed and results reported to RN.

## 2015-09-04 NOTE — ED Triage Notes (Addendum)
PT c/o center chest pain radiating to his back with some SOB that started x20 min ago prior to ED arrival. PT states he did smoke Crack yesterday evening as well.

## 2015-09-04 NOTE — Progress Notes (Signed)
Critical lab value received from Lab. Troponin 11.18. M.[facility] midlevel had been notified.

## 2015-09-04 NOTE — Progress Notes (Signed)
Phone call to [place] LPN. Report given to her on Critical Lab of Troponin 11.18. She stated she will talk with charge nurse in case patient may need to go to step down.

## 2015-09-04 NOTE — Progress Notes (Signed)
Patient transferred to [facility] via [place].

## 2015-09-04 NOTE — Progress Notes (Signed)
Received call from lab that troponin is 10.77. Dr. Jerilee Hoh notified. BP 100/60 pulse 88. O2 sat 100% on room air. States chest pain is a "6". Stat EKG done. Sublingual Nitro given x 1.

## 2015-09-04 NOTE — Progress Notes (Signed)
Phone call made t to [place] to give report on patient transferring. Receiving RN will call back to get report. Provided call back phone number.

## 2015-09-04 NOTE — Progress Notes (Signed)
States chest pain " is a 4 now."

## 2015-09-04 NOTE — ED Provider Notes (Signed)
[facility] DEPT Provider Note   CSN: [id number] Arrival date & time: 09/04/15  [id number]  By signing my name below, I, [name], attest that this documentation has been prepared under the direction and in the presence of Blanchie Dessert, MD. Electronically Signed: [name], ED Scribe. 09/04/15. 10:13 AM.   History   Chief Complaint Chief Complaint  Patient presents with  . Chest Pain    HPI HPI Comments: [name] is a 60 y.o. male with a history of CAD s/p stents placed last year, DM, HTN, HLD, MI and obesity who presents to the Emergency Department complaining of constant, 10/10, centralized CP onset PTA. Pt states he currently feels near syncopal noting that his CP began while at a friends house "which was hot". He reports associated SOB and mild, diffuse, abd pain. Pt states that he takes regular medications for his cardiac issues but states he "hasn't taken them in a couple of months". Pt is a smoker and also states he smoked crack cocaine last night which he does "once in a blue moon". Pt denies having eaten this morning. He has no known kidney issues and "sometimes" takes his DM medications. Pt denies any recent ETOH use. He denies n/v, cough, congestion or any cold like symptoms.    The history is provided by the patient. No language interpreter was used.    Past Medical History:  Diagnosis Date  . CAD (coronary artery disease)    STEMI-PCI   . Coronary artery disease   . Diabetes mellitus without complication ([place])   . DM2 (diabetes mellitus, type 2) ([place])   . HTN (hypertension)   . Hyperlipemia   . Hyperlipidemia   . Hypertension   . MI, old   . Obesity   . Tobacco use     Patient Active Problem List   Diagnosis Date Noted  . Near syncope 06/29/2015  . Essential hypertension 06/29/2015  . History of heart artery stents 06/29/2015  . Hypovolemia 06/29/2015  . IDDM (insulin dependent diabetes mellitus) ([place]) 06/29/2015  . History of MI  (myocardial infarction) 06/29/2015  . Chronic systolic CHF [id number]  . Chest pain 05/27/2015  . Rectal bleeding   . Stage 3 chronic renal impairment associated with type 2 diabetes mellitus ([place]) 09/09/2013  . 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 CAD- residual RCA and OM disease on re-look cath 09/07/13 09/09/2013  . Cardiomyopathy, ischemic-EF 40-45% by echo 09/07/13 09/09/2013  . S/P LAD DES June 2014 09/06/2013  . Tobacco user 09/06/2013  . Hyperglycemia 08/22/2012  . Dyslipidemia 07/07/2012  . Hypertensive heart disease     Past Surgical History:  Procedure Laterality Date  . CORONARY ANGIOGRAM  09/07/13   residual RCA and OM disease  . CORONARY ANGIOPLASTY WITH STENT PLACEMENT    . CORONARY ANGIOPLASTY WITH STENT PLACEMENT  June 2014   LAD DES  . CORONARY ANGIOPLASTY WITH STENT PLACEMENT  09/06/13    STEMI (LAD ISR)  . LEFT HEART CATH Bilateral 07/08/2012   Procedure: LEFT HEART CATH;  Surgeon: [name], MD;  Location: [facility] CATH LAB;  Service: Cardiovascular;  Laterality: Bilateral;  . LEFT HEART CATHETERIZATION WITH CORONARY ANGIOGRAM N/A 09/06/2013   Procedure: LEFT HEART CATHETERIZATION WITH CORONARY ANGIOGRAM;  Surgeon: [name], MD;  Location: [facility] CATH LAB;  Service: Cardiovascular;  Laterality: N/A;  . PERCUTANEOUS CORONARY STENT INTERVENTION (PCI-S)  07/08/2012   Procedure: PERCUTANEOUS CORONARY STENT INTERVENTION (PCI-S);  Surgeon: [name], MD;  Location: [facility] CATH LAB;  Service: Cardiovascular;;  DES LAD  . PERCUTANEOUS CORONARY STENT INTERVENTION (PCI-S) N/A 09/06/2013   Procedure: PERCUTANEOUS CORONARY STENT INTERVENTION (PCI-S);  Surgeon: [name], MD;  Location: [facility] CATH LAB;  Service: Cardiovascular;  Laterality: N/A;  Mid LAD 3.0/24mm Promus       Home Medications    Prior to Admission medications   Medication Sig Start Date End Date Taking? Authorizing Provider  acetaminophen (TYLENOL) 325 MG tablet Take  2 tablets (650 mg total) by mouth every 4 (four) hours as needed for headache or mild pain. 09/09/13   [name], PA-C  aspirin EC 81 MG tablet Take 81 mg by mouth daily.     Historical Provider, MD  glipiZIDE (GLUCOTROL) 5 MG tablet Take 5 mg by mouth 2 (two) times daily.    Historical Provider, MD  HYDROcodone-acetaminophen (NORCO/VICODIN) 5-325 MG per tablet Take 1-2 tablets by mouth every 6 (six) hours as needed for severe pain. 09/28/14   Jennifer Piepenbrink, PA-C  hydrocortisone cream 0.5 % Apply 1 application topically 2 (two) times daily.    Historical Provider, MD  insulin glargine (LANTUS) 100 UNIT/ML injection Inject 25-35 Units into the skin at bedtime.     Historical Provider, MD  isosorbide mononitrate (IMDUR) 60 MG 24 hr tablet Take 1 tablet (60 mg total) by mouth daily. 05/28/15   Erline Hau, MD  lisinopril (PRINIVIL,ZESTRIL) 5 MG tablet Take 2.5 mg by mouth daily.     Historical Provider, MD  metFORMIN (GLUCOPHAGE) 1000 MG tablet Take 1 tablet (1,000 mg total) by mouth 2 (two) times daily with a meal. Do not restart Metformin until Wednesday am 7/2 Patient taking differently: Take 1,000 mg by mouth 2 (two) times daily.  07/10/12   [name], MD  metoprolol succinate (TOPROL-XL) 50 MG 24 hr tablet Take 25 mg by mouth daily.     Historical Provider, MD  nitroGLYCERIN (NITROSTAT) 0.4 MG SL tablet Place 1 tablet (0.4 mg total) under the tongue every 5 (five) minutes x 3 doses as needed for chest pain. 05/03/14   Thurnell Lose, MD  nystatin (MYCOSTATIN) powder Apply topically 4 (four) times daily. 05/28/15   Erline Hau, MD  prasugrel (EFFIENT) 10 MG TABS tablet Take 1 tablet (10 mg total) by mouth daily. 09/09/13   [name], PA-C  Vitamin D, Ergocalciferol, (DRISDOL) 50000 units CAPS capsule Take 50,000 Units by mouth every 7 (seven) days.    Historical Provider, MD    Family History Family History  Problem Relation Age of Onset  . Hypertension  Mother   . Diabetes Mother   . Hypertension Father   . Diabetes Father   . Hypertension Sister   . Diabetes Sister     Social History Social History  Substance Use Topics  . Smoking status: Current Every Day Smoker    Packs/day: 0.50    Types: Cigarettes  . Smokeless tobacco: Never Used  . Alcohol use Yes     Comment: occ     Allergies   Review of patient's allergies indicates no known allergies.   Review of Systems Review of Systems  HENT: Negative for congestion and rhinorrhea.   Respiratory: Positive for shortness of breath. Negative for cough.   Cardiovascular: Positive for chest pain.  Gastrointestinal: Positive for abdominal pain. Negative for nausea and vomiting.  All other systems reviewed and are negative.  Physical Exam Updated Vital Signs Ht 5\' 9"  (1.753 m)  Wt 240 lb (108.9 kg)   BMI 35.44 kg/m   Physical Exam  Constitutional: He is oriented to person, place, and time. He appears well-developed and well-nourished.  HENT:  Head: Normocephalic and atraumatic.  Eyes: EOM are normal.  Neck: Normal range of motion.  Cardiovascular: Normal rate, regular rhythm, normal heart sounds and intact distal pulses.  Exam reveals no gallop and no friction rub.   No murmur heard. Pulmonary/Chest: Effort normal and breath sounds normal. No respiratory distress. He has no wheezes. He has no rales.  LCTA  Abdominal: Soft. He exhibits no distension. There is no tenderness.  Musculoskeletal: Normal range of motion. He exhibits no edema.  No peripheral edema noted  Neurological: He is alert and oriented to person, place, and time.  Skin: Skin is warm and dry.  Psychiatric: He has a normal mood and affect. Judgment normal.  Nursing note and vitals reviewed.  ED Treatments / Results  Labs (all labs ordered are listed, but only abnormal results are displayed) Labs Reviewed  CBC WITH DIFFERENTIAL/PLATELET - Abnormal; Notable for the following:       Result Value   WBC  17.4 (*)    RBC 5.90 (*)    Neutro Abs 15.1 (*)    All other components within normal limits  COMPREHENSIVE METABOLIC PANEL - Abnormal; Notable for the following:    Sodium 132 (*)    Chloride 97 (*)    Glucose, Bld 434 (*)    BUN 22 (*)    Creatinine, Ser 1.72 (*)    GFR calc non Af Amer 41 (*)    GFR calc Af Amer 48 (*)    All other components within normal limits  TROPONIN I    EKG  EKG Interpretation  Date/Time:  Friday September 04 2015 10:17:24 EDT Ventricular Rate:  82 PR Interval:    QRS Duration: 104 QT Interval:  394 QTC Calculation: 461 R Axis:   -26 Text Interpretation:  Sinus rhythm Borderline left axis deviation Anterolateral infarct, age indeterminate Abnormal T, consider ischemia, lateral leads No significant change since last tracing Confirmed by Maryan Rued  MD, Loree Fee ([id number]) on 09/04/2015 10:42:33 AM       Radiology Dg Chest 2 View  Result Date: 09/04/2015 CLINICAL DATA:  CHEST PAIN JUST LEFT TO CENTER AND SOB SINCE THIS AM, HX OF HEART STENTS, LAST ONE APPROX 1 YEAR AGO EXAM: CHEST - 2 VIEW COMPARISON:  06/29/2015 FINDINGS: Linear scarring or subsegmental atelectasis in the lingula seen best on the lateral projection, chronic. Lungs otherwise clear. Heart size upper limits normal for technique. No pneumothorax or effusion. Chronic widening of bilateral acromioclavicular joints. IMPRESSION: No acute cardiopulmonary disease. Electronically Signed   By: Lucrezia Europe M.D.   On: 09/04/2015 11:05    Procedures Procedures  COORDINATION OF CARE: 10:10 AM Discussed next steps with pt. Pt verbalized understanding and is agreeable with the plan.    Medications Ordered in ED Medications  morphine 4 MG/ML injection 4 mg (not administered)  ondansetron (ZOFRAN) injection 4 mg (not administered)  sodium chloride 0.9 % bolus 500 mL (500 mLs Intravenous New Bag/Given 09/04/15 1014)  aspirin chewable tablet 324 mg (324 mg Oral Given 09/04/15 1016)     Initial Impression /  Assessment and Plan / ED Course  I have reviewed the triage vital signs and the nursing notes.  Pertinent labs & imaging results that were available during my care of the patient were reviewed by me and considered in my medical  decision making (see chart for details).  Clinical Course   Patient is a 61 year old male with a significant history of coronary artery disease status post stenting 3 years ago and repeat catheterization in 2015 showing diffuse disease related to uncontrolled risk factors such as diabetes and hypertension presenting today with symptoms of near syncope and chest pain. Patient states he started to feel hot and felt like he might pass out but he then developed chest pain. He describes the pain as a pressure sensation in the center of his chest and has some tingling in his left arm. He has not taken any of his medication in the last 2-3 months because he did not feel that he needed it and smoked crack yesterday. Patient denies any illness prior to symptoms today and is currently still having pain. Patient's initial blood pressure was in the [id number] systolic which responded to IV fluid. He denied any alcohol use and nausea and vomiting. Patient did have improvement of his pain with morphine however hesitant to give nitroglycerin due to his soft blood pressures. He was given 325 of aspirin.\ EKG and repeat EKG showed T-wave inversion that is unchanged. Chest x-ray, troponin and CMP without acute changes. CBC today with a leukocytosis of 17,000. Patient denies any illness but states he does have a draining boil in his groin. He is afebrile here.  Patient seen by Dr. Harrington Challenger with cardiology and at this time she recommended heparin drip and supportive care with management of underlying risk factors. Does not feel that he needed transfer to [facility].  Patient was given Bactrim for draining abscess but does not appear to need I&D at this time.  Repeat blood pressure in the [id number] systolic.  I personally  performed the services described in this documentation, which was scribed in my presence.  The recorded information has been reviewed and considered.  Final Clinical Impressions(s) / ED Diagnoses   Final diagnoses:  Nonspecific chest pain  Abscess of groin, left    New Prescriptions New Prescriptions   No medications on file     Blanchie Dessert, MD 09/04/15 1333

## 2015-09-04 NOTE — ED Notes (Signed)
Unable to get blood ?

## 2015-09-04 NOTE — Progress Notes (Signed)
Report given to Arthor Captain LPN at [facility] on transferring patient.

## 2015-09-04 NOTE — ED Notes (Signed)
Delay in giving pain medication. Pt BP dropped to 90/58. MD made aware. Pt being given another bolus.

## 2015-09-05 ENCOUNTER — Observation Stay (HOSPITAL_COMMUNITY): Payer: Medicare Other

## 2015-09-05 ENCOUNTER — Encounter (HOSPITAL_COMMUNITY): Payer: Self-pay | Admitting: Physician Assistant

## 2015-09-05 DIAGNOSIS — Z9114 Patient's other noncompliance with medication regimen: Secondary | ICD-10-CM | POA: Diagnosis not present

## 2015-09-05 DIAGNOSIS — E785 Hyperlipidemia, unspecified: Secondary | ICD-10-CM | POA: Diagnosis present

## 2015-09-05 DIAGNOSIS — R079 Chest pain, unspecified: Secondary | ICD-10-CM

## 2015-09-05 DIAGNOSIS — E1140 Type 2 diabetes mellitus with diabetic neuropathy, unspecified: Secondary | ICD-10-CM | POA: Diagnosis present

## 2015-09-05 DIAGNOSIS — N179 Acute kidney failure, unspecified: Secondary | ICD-10-CM | POA: Diagnosis not present

## 2015-09-05 DIAGNOSIS — E1122 Type 2 diabetes mellitus with diabetic chronic kidney disease: Secondary | ICD-10-CM | POA: Diagnosis not present

## 2015-09-05 DIAGNOSIS — Z720 Tobacco use: Secondary | ICD-10-CM

## 2015-09-05 DIAGNOSIS — I252 Old myocardial infarction: Secondary | ICD-10-CM | POA: Diagnosis not present

## 2015-09-05 DIAGNOSIS — E1121 Type 2 diabetes mellitus with diabetic nephropathy: Secondary | ICD-10-CM | POA: Diagnosis not present

## 2015-09-05 DIAGNOSIS — Z79899 Other long term (current) drug therapy: Secondary | ICD-10-CM | POA: Diagnosis not present

## 2015-09-05 DIAGNOSIS — E1165 Type 2 diabetes mellitus with hyperglycemia: Secondary | ICD-10-CM | POA: Diagnosis present

## 2015-09-05 DIAGNOSIS — F1410 Cocaine abuse, uncomplicated: Secondary | ICD-10-CM | POA: Diagnosis not present

## 2015-09-05 DIAGNOSIS — I2510 Atherosclerotic heart disease of native coronary artery without angina pectoris: Secondary | ICD-10-CM | POA: Diagnosis not present

## 2015-09-05 DIAGNOSIS — I130 Hypertensive heart and chronic kidney disease with heart failure and stage 1 through stage 4 chronic kidney disease, or unspecified chronic kidney disease: Secondary | ICD-10-CM | POA: Diagnosis not present

## 2015-09-05 DIAGNOSIS — R55 Syncope and collapse: Secondary | ICD-10-CM | POA: Diagnosis present

## 2015-09-05 DIAGNOSIS — I209 Angina pectoris, unspecified: Secondary | ICD-10-CM | POA: Diagnosis not present

## 2015-09-05 DIAGNOSIS — Y831 Surgical operation with implant of artificial internal device as the cause of abnormal reaction of the patient, or of later complication, without mention of misadventure at the time of the procedure: Secondary | ICD-10-CM | POA: Diagnosis present

## 2015-09-05 DIAGNOSIS — T82867A Thrombosis of cardiac prosthetic devices, implants and grafts, initial encounter: Secondary | ICD-10-CM | POA: Diagnosis not present

## 2015-09-05 DIAGNOSIS — Z7984 Long term (current) use of oral hypoglycemic drugs: Secondary | ICD-10-CM | POA: Diagnosis not present

## 2015-09-05 DIAGNOSIS — I214 Non-ST elevation (NSTEMI) myocardial infarction: Secondary | ICD-10-CM | POA: Diagnosis not present

## 2015-09-05 DIAGNOSIS — I472 Ventricular tachycardia: Secondary | ICD-10-CM | POA: Diagnosis not present

## 2015-09-05 DIAGNOSIS — I200 Unstable angina: Secondary | ICD-10-CM | POA: Diagnosis not present

## 2015-09-05 DIAGNOSIS — N183 Chronic kidney disease, stage 3 (moderate): Secondary | ICD-10-CM | POA: Diagnosis not present

## 2015-09-05 DIAGNOSIS — Z7982 Long term (current) use of aspirin: Secondary | ICD-10-CM | POA: Diagnosis not present

## 2015-09-05 DIAGNOSIS — I5022 Chronic systolic (congestive) heart failure: Secondary | ICD-10-CM | POA: Diagnosis not present

## 2015-09-05 DIAGNOSIS — Z6836 Body mass index (BMI) 36.0-36.9, adult: Secondary | ICD-10-CM | POA: Diagnosis not present

## 2015-09-05 DIAGNOSIS — I255 Ischemic cardiomyopathy: Secondary | ICD-10-CM

## 2015-09-05 DIAGNOSIS — I25110 Atherosclerotic heart disease of native coronary artery with unstable angina pectoris: Secondary | ICD-10-CM | POA: Diagnosis not present

## 2015-09-05 DIAGNOSIS — E119 Type 2 diabetes mellitus without complications: Secondary | ICD-10-CM | POA: Diagnosis not present

## 2015-09-05 DIAGNOSIS — F17210 Nicotine dependence, cigarettes, uncomplicated: Secondary | ICD-10-CM | POA: Diagnosis present

## 2015-09-05 DIAGNOSIS — Z794 Long term (current) use of insulin: Secondary | ICD-10-CM | POA: Diagnosis not present

## 2015-09-05 DIAGNOSIS — I10 Essential (primary) hypertension: Secondary | ICD-10-CM | POA: Diagnosis not present

## 2015-09-05 DIAGNOSIS — E669 Obesity, unspecified: Secondary | ICD-10-CM | POA: Diagnosis present

## 2015-09-05 LAB — URINALYSIS, ROUTINE W REFLEX MICROSCOPIC
BILIRUBIN URINE: NEGATIVE
HGB URINE DIPSTICK: NEGATIVE
Ketones, ur: NEGATIVE mg/dL
Leukocytes, UA: NEGATIVE
Nitrite: NEGATIVE
PROTEIN: NEGATIVE mg/dL
Specific Gravity, Urine: 1.034 — ABNORMAL HIGH (ref 1.005–1.030)
pH: 5 (ref 5.0–8.0)

## 2015-09-05 LAB — CBC
HCT: 42.6 % (ref 39.0–52.0)
HEMOGLOBIN: 13.6 g/dL (ref 13.0–17.0)
MCH: 25.8 pg — AB (ref 26.0–34.0)
MCHC: 31.9 g/dL (ref 30.0–36.0)
MCV: 80.7 fL (ref 78.0–100.0)
Platelets: 220 10*3/uL (ref 150–400)
RBC: 5.28 MIL/uL (ref 4.22–5.81)
RDW: 16 % — ABNORMAL HIGH (ref 11.5–15.5)
WBC: 11.3 10*3/uL — ABNORMAL HIGH (ref 4.0–10.5)

## 2015-09-05 LAB — TROPONIN I
TROPONIN I: 9.64 ng/mL — AB (ref <0.03)
Troponin I: 15.34 ng/mL (ref <0.03)
Troponin I: 9.93 ng/mL (ref <0.03)

## 2015-09-05 LAB — MRSA PCR SCREENING: MRSA by PCR: NEGATIVE

## 2015-09-05 LAB — GLUCOSE, CAPILLARY
GLUCOSE-CAPILLARY: 207 mg/dL — AB (ref 65–99)
GLUCOSE-CAPILLARY: 109 mg/dL — AB (ref 65–99)
Glucose-Capillary: 194 mg/dL — ABNORMAL HIGH (ref 65–99)
Glucose-Capillary: 240 mg/dL — ABNORMAL HIGH (ref 65–99)

## 2015-09-05 LAB — ECHOCARDIOGRAM COMPLETE
Height: 69 in
Weight: 3801.6 oz

## 2015-09-05 LAB — RAPID URINE DRUG SCREEN, HOSP PERFORMED
Amphetamines: NOT DETECTED
BARBITURATES: NOT DETECTED
Benzodiazepines: NOT DETECTED
Cocaine: POSITIVE — AB
Opiates: POSITIVE — AB
TETRAHYDROCANNABINOL: NOT DETECTED

## 2015-09-05 LAB — HEPARIN LEVEL (UNFRACTIONATED)
HEPARIN UNFRACTIONATED: 0.25 [IU]/mL — AB (ref 0.30–0.70)
Heparin Unfractionated: 0.23 IU/mL — ABNORMAL LOW (ref 0.30–0.70)

## 2015-09-05 LAB — URINE MICROSCOPIC-ADD ON

## 2015-09-05 LAB — HEMOGLOBIN A1C
HEMOGLOBIN A1C: 12.4 % — AB (ref 4.8–5.6)
Mean Plasma Glucose: 309 mg/dL

## 2015-09-05 MED ORDER — SODIUM CHLORIDE 0.9 % IV SOLN
Freq: Once | INTRAVENOUS | Status: AC
  Administered 2015-09-05: 10:00:00 via INTRAVENOUS

## 2015-09-05 MED ORDER — SODIUM CHLORIDE 0.9 % IV BOLUS (SEPSIS)
250.0000 mL | Freq: Once | INTRAVENOUS | Status: AC
  Administered 2015-09-05: 250 mL via INTRAVENOUS

## 2015-09-05 MED ORDER — ROSUVASTATIN CALCIUM 10 MG PO TABS
5.0000 mg | ORAL_TABLET | Freq: Every day | ORAL | Status: DC
  Administered 2015-09-06 – 2015-09-08 (×3): 5 mg via ORAL
  Filled 2015-09-05 (×3): qty 1

## 2015-09-05 MED ORDER — OXYCODONE-ACETAMINOPHEN 5-325 MG PO TABS
1.0000 | ORAL_TABLET | Freq: Three times a day (TID) | ORAL | Status: DC | PRN
  Administered 2015-09-05 – 2015-09-08 (×7): 1 via ORAL
  Filled 2015-09-05 (×7): qty 1

## 2015-09-05 MED ORDER — HEPARIN BOLUS VIA INFUSION
1500.0000 [IU] | Freq: Once | INTRAVENOUS | Status: AC
  Administered 2015-09-05: 1500 [IU] via INTRAVENOUS
  Filled 2015-09-05: qty 1500

## 2015-09-05 NOTE — Progress Notes (Signed)
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 D/c acei /imdur due to low bp -cardiology consulted, Given his non-compliance and cocaine use, treat medically at this time.   chronic systolic chf: euvolemic on exam, not a candidate for betablocker due to cocaine use. Lisinopril d/ced due to Low bp, close monitor volume status, cardiology following   H/o HTN, now hypotension -now sbp in 80's -hold all bp meds,  -may need stress does steroids/pressor if bp continue to be low, he already received ivf at [facility], he does has h/o systolic chf, will need to be cautious with fluids,   Leukocytosis: No fever, does has low bp, leukocytosis and low bp could all because of NSTEMI, but will check ua, blood culture to r/o infection, cxr unremarkable.  CKD III, cr 1.72 close to baseline  HLD -Not on statin at home. -Start crestor.  Insulin dependent DM -A1C 12.4 -Continue home dose of lantus. -Start SSI.  Cocaine Use -Counseled on cessation.  Smoking cessation education, he declined nicotine patch   DVT prophylaxis: heparin  Code Status: full code  Family Communication: patient  Disposition Plan: pending Consults called: cardiology     Procedures:  none  Antibiotics:  none   Objective: BP (!) 82/65 (BP Location: Left Arm)   Pulse 60   Temp 97.8 F (36.6 C) (Oral)   Resp 18   Ht 5\' 9"  (1.753 m)   Wt 107.8 kg (237 lb 9.6 oz)   SpO2 99%   BMI 35.09 kg/m   Intake/Output Summary (Last 24 hours) at 09/05/15 0802 Last data filed at 09/05/15 0328  Gross per 24 hour  Intake            640.7 ml  Output              700 ml  Net            -59.3 ml   Filed Weights   09/04/15 0956 09/04/15 2212 09/05/15 0445  Weight: 108.9 kg (240 lb) 108.3 kg (238 lb 12.8 oz) 107.8 kg (237 lb 9.6 oz)    Exam:   General:  NAD  Cardiovascular: RRR  Respiratory: CTABL  Abdomen: Soft/ND/NT, positive BS  Musculoskeletal: No Edema  Neuro: aaox3  Data Reviewed: Basic Metabolic Panel:  Recent Labs Lab 09/04/15 1002  NA 132*  K 4.5  CL 97*  CO2 24  GLUCOSE 434*  BUN 22*  CREATININE 1.72*  CALCIUM 9.3   Liver Function Tests:  Recent Labs Lab 09/04/15 1002  AST 21  ALT 28  ALKPHOS 89  BILITOT 0.7  PROT 7.9  ALBUMIN 3.8   No results for input(s): LIPASE, AMYLASE in the last 168 hours. No results for input(s): AMMONIA in the last 168 hours. CBC:  Recent Labs Lab 09/04/15 1002  WBC 17.4*  NEUTROABS 15.1*  HGB 15.7  HCT 47.2  MCV 80.0  PLT 268   Cardiac Enzymes:    Recent Labs Lab 09/04/15 1002 09/04/15 1709 09/04/15 1931  TROPONINI <0.03 10.77* 11.18*   BNP (last 3 results)  Recent Labs  09/25/14 2007 09/27/14 2052  BNP 47.0 50.5    ProBNP (last 3 results) No results for input(s): PROBNP in the last 8760 hours.  CBG:  Recent Labs Lab 09/04/15 1404 09/04/15 2243 09/05/15 0726  GLUCAP 314* 234* 207*    Recent Results (from the past 240 hour(s))  MRSA PCR Screening     Status: None   Collection Time: 09/04/15 10:10 PM  Result Value Ref Range Status   MRSA by PCR NEGATIVE NEGATIVE Final    Comment:        The GeneXpert MRSA Assay (FDA approved for NASAL  specimens only), is one component of a comprehensive MRSA colonization surveillance program. It is not intended to diagnose MRSA infection nor to guide or monitor treatment for MRSA infections.      Studies: Dg Chest 2 View  Result Date: 09/04/2015 CLINICAL DATA:  CHEST PAIN JUST LEFT TO CENTER AND SOB SINCE THIS AM, HX OF HEART STENTS, LAST ONE APPROX 1 YEAR AGO EXAM: CHEST - 2 VIEW COMPARISON:  06/29/2015 FINDINGS: Linear scarring or subsegmental atelectasis in the lingula seen best on the lateral projection, chronic. Lungs otherwise clear. Heart size upper limits normal for technique. No pneumothorax or effusion. Chronic widening of bilateral acromioclavicular joints. IMPRESSION: No acute cardiopulmonary disease. Electronically Signed   By: Lucrezia Europe M.D.   On: 09/04/2015 11:05    Scheduled Meds: . aspirin EC  81 mg Oral Daily  . glipiZIDE  5 mg Oral BID WC  . insulin aspart  0-15 Units Subcutaneous TID WC  . insulin aspart  0-5 Units Subcutaneous QHS  . insulin aspart  4 Units Subcutaneous TID WC  . insulin glargine  30 Units Subcutaneous QHS  . prasugrel  10 mg Oral Daily  . rosuvastatin  5 mg Oral q1800  . sodium chloride  250 mL Intravenous Once  . Vitamin D (Ergocalciferol)  50,000 Units Oral Q7 days    Continuous Infusions: . heparin 1,200 Units/hr (09/05/15 0328)     Time spent: 31mins  Katyra Tomassetti MD, PhD  [facility] Pager [phone]. If 7PM-7AM, please contact night-coverage at [email or web address], password [facility] 09/05/2015, 8:02 AM  LOS: 0 days

## 2015-09-05 NOTE — Progress Notes (Signed)
ANTICOAGULATION CONSULT NOTE  Pharmacy Consult for heparin Indication: chest pain/ACS  No Known Allergies  Patient Measurements: Height: 5\' 9"  (175.3 cm) Weight: 237 lb 9.6 oz (107.8 kg) IBW/kg (Calculated) : 70.7 Heparin Dosing Weight: 94.4  Vital Signs: Temp: 97.3 F (36.3 C) (08/26 1602) Temp Source: Oral (08/26 1602) BP: 92/74 (08/26 1602) Pulse Rate: 64 (08/26 1602)  Labs:  Recent Labs  09/04/15 1002  09/04/15 1931 09/05/15 0831 09/05/15 1456 09/05/15 1625  HGB 15.7  --   --   --   --  13.6  HCT 47.2  --   --   --   --  42.6  PLT 268  --   --   --   --  220  HEPARINUNFRC  --   --   --  0.25*  --  0.23*  CREATININE 1.72*  --   --   --   --   --   TROPONINI <0.03  < > 11.18* 15.34* 9.93*  --   < > = values in this interval not displayed.  Estimated Creatinine Clearance: 54.5 mL/min (by C-G formula based on SCr of 1.72 mg/dL).   Assessment: [name] is a 60 yo male who was transferred from [facility] for ACS.  PM heparin level = 0.23  Goal of Therapy:  Heparin level 0.3-0.7 units/ml Monitor platelets by anticoagulation protocol: Yes   Plan:  Heparin bolus of 1500 units x 1  Increase heparin infusion to 1550 units/hr Daily HL/CBC Monitor for s/sx of bleeding  Thank you Anette Guarneri, PharmD [phone]  09/05/2015,5:41 PM

## 2015-09-05 NOTE — Progress Notes (Addendum)
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 year old male with a history of DES LAD 2014, STEMI 2015 w/ DES LAD for ISR, HTN, DM, ICM EF 40-45% by echo 2015, HLD, polysubstance abuse, was admitted 08/25 to [facility] with chest pain. Seen there by Dr Harrington Challenger and tx [facility].  SUBJECTIVE: CP is 2/10 now, was a zero earlier, he gets chest pain when his SBP is < 100. Admits he did cocaine yesterday  OBJECTIVE Vitals:   09/05/15 0645 09/05/15 0648 09/05/15 0652 09/05/15 0730  BP: (!) 89/69 (!) 88/65 (!) 83/59 (!) 82/65  Pulse: 62 65 (!) 59 60  Resp: 15 19 16 18   Temp:    97.8 F (36.6 C)  TempSrc:    Oral  SpO2: 99% 97% 96% 99%  Weight:      Height:        Intake/Output Summary (Last 24 hours) at 09/05/15 0930 Last data filed at 09/05/15 0328  Gross per 24 hour  Intake            640.7 ml  Output              700 ml  Net            -59.3 ml   Filed Weights   09/04/15 0956 09/04/15 2212 09/05/15 0445  Weight: 240 lb (108.9 kg) 238 lb 12.8 oz (108.3 kg) 237 lb 9.6 oz (107.8 kg)    PHYSICAL EXAM General: Well developed, well nourished, male in no acute distress. Head: Normocephalic, atraumatic.  Neck: Supple without bruits, JVD difficult to assess 2nd body habitus. Lungs:  Resp regular and unlabored, decreased BS bases w/ some rales. Heart: RRR, S1, S2, + S3, no S4, or murmur; no rub. Abdomen: Soft, non-tender, non-distended, BS + x 4.  Extremities: No clubbing, cyanosis, edema. Slightly decreased peripheral pulses Neuro: Alert and oriented X 3. Moves all extremities  spontaneously. Psych: Normal affect.  LABS: CBC: Recent Labs  09/04/15 1002  WBC 17.4*  NEUTROABS 15.1*  HGB 15.7  HCT 47.2  MCV 80.0  PLT [id number]   Basic Metabolic Panel: Recent Labs  09/04/15 1002  NA 132*  K 4.5  CL 97*  CO2 24  GLUCOSE 434*  BUN 22*  CREATININE 1.72*  CALCIUM 9.3   Liver Function Tests: Recent Labs  09/04/15 1002  AST 21  ALT 28  ALKPHOS 89  BILITOT 0.7  PROT 7.9  ALBUMIN 3.8   Cardiac Enzymes: Recent Labs  09/04/15 1002 09/04/15 1709 09/04/15 1931  TROPONINI <0.03 10.77* 11.18*   Hemoglobin A1C: Recent Labs  09/04/15 1002  HGBA1C 12.4*   Fasting Lipid Panel: Lipid Panel     Component Value Date/Time   CHOL 216 (H) 09/09/2013 0911   TRIG 229 (H) 09/09/2013 0911   HDL 27 (L) 09/09/2013 0911   CHOLHDL 8.0 09/09/2013 0911   VLDL 46 (H) 09/09/2013 0911   LDLCALC 143 (H) 09/09/2013 0911   Drugs of Abuse     Component Value Date/Time   LABOPIA  NONE DETECTED 07/08/2012 0525   COCAINSCRNUR POSITIVE (A) 07/08/2012 0525   LABBENZ NONE DETECTED 07/08/2012 0525   AMPHETMU NONE DETECTED 07/08/2012 0525   THCU NONE DETECTED 07/08/2012 0525   LABBARB NONE DETECTED 07/08/2012 0525    TELE:  SR, NSVT      Echo: 2015 - Left ventricle: There is akinesis iof the mid and distal anterior, distal septal walls and the true apex. The cavity size was normal. There was mild concentric hypertrophy. Systolic function was mildly to moderately reduced. The estimated ejection fraction was in the range of 40% to 45%. Doppler parameters are consistent with abnormal left ventricular relaxation (grade 1 diastolic dysfunction). There was no evidence of elevated ventricular filling pressure by Doppler parameters. - Aortic valve: Trileaflet; normal thickness leaflets. There was no regurgitation. - Aortic root: The aortic root was normal in size. - Mitral valve: There was no regurgitation. - Right atrium: The atrium was normal in  size. - Tricuspid valve: There was no regurgitation. - Pulmonic valve: There was no regurgitation. - Pulmonary arteries: Systolic pressure was within the normalrange. - Pericardium, extracardiac: There was no pericardial effusion.  ECG:  08/25 SR, no sig change since 06/2015  Cath: 09/07/2013 (had DES LAD for STEMI w/ ISR LAD 08/28) Final Conclusions:   1. Continued LAD stent patency with TIMI-3 flow. 2. Diffuse diabetic coronary artery disease with and distal vessel disease pattern involving the distal RCA, PDA, diagonal branches, and obtuse marginal branch. 3. Elevated LVEDP, unable to quantitate LV function but normal contraction of the basal segments of the left ventricle Recommendations: Continue medical management. 2-D echocardiogram for evaluation of LV systolic function.  Radiology:  Dg Chest 2 View Result Date: 09/04/2015 CLINICAL DATA:  CHEST PAIN JUST LEFT TO CENTER AND SOB SINCE THIS AM, HX OF HEART STENTS, LAST ONE APPROX 1 YEAR AGO EXAM: CHEST - 2 VIEW COMPARISON:  06/29/2015 FINDINGS: Linear scarring or subsegmental atelectasis in the lingula seen best on the lateral projection, chronic. Lungs otherwise clear. Heart size upper limits normal for technique. No pneumothorax or effusion. Chronic widening of bilateral acromioclavicular joints. IMPRESSION: No acute cardiopulmonary disease. Electronically Signed   By: Lucrezia Europe M.D.   On: 09/04/2015 11:05    Current Medications:  . aspirin EC  81 mg Oral Daily  . glipiZIDE  5 mg Oral BID WC  . insulin aspart  0-15 Units Subcutaneous TID WC  . insulin aspart  0-5 Units Subcutaneous QHS  . insulin aspart  4 Units Subcutaneous TID WC  . insulin glargine  30 Units Subcutaneous QHS  . prasugrel  10 mg Oral Daily  . [START ON 09/06/2015] rosuvastatin  5 mg Oral Daily  . Vitamin D (Ergocalciferol)  50,000 Units Oral Q7 days   . heparin 1,200 Units/hr (09/05/15 0328)    ASSESSMENT AND PLAN: Principal Problem: 1.  Chest pain -  NSTEMI by enzymes - known CAD w/ stent LAD and diffuse multivessel dz  - he is having CP, will use IVF to get SBP > 100 - no nitrates, ACE or BB, w/ low BP - continue heparin, ASA and statin - pt may need cath, but with elevated BUN/Cr and recent cocaine use, med rx for now.  - echo performed, results pending.  2. NSVT - asymptomatic, 1 run last pm. 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 Agree with the findings as noted by Rosaria Ferries, PA-C. He is a pleasant middle aged man with ongoing substance abuse who was admitted and transferred for evaluation of chest pain. He has ruled in for a NSTEMI. He is now pain free. He has a troponin of 15. He denies sob presently but was dyspneic in the setting of chest and arm pain. His exam is unremarkable except that he is overweight. Additional labs are notable for a CBG of 434 despite insulin therapy.   A/P 1. NSTEMI - he will need to undergo left heart cath, sooner than later if his symptoms recur. Will continue IV heparin. Will avoid beta blockers.  2. Substance abuse - I asked him to stop smoking. He will try to stop. He is instructed to stop using Cocaine. 3. HTN - his blood pressure is not elevated.   Mikle Bosworth.D.

## 2015-09-05 NOTE — Progress Notes (Signed)
ANTICOAGULATION CONSULT NOTE - Initial Consult  Pharmacy Consult for heparin Indication: chest pain/ACS  No Known Allergies  Patient Measurements: Height: 5\' 9"  (175.3 cm) Weight: 237 lb 9.6 oz (107.8 kg) IBW/kg (Calculated) : 70.7 Heparin Dosing Weight: 94.4  Vital Signs: Temp: 97.8 F (36.6 C) (08/26 0730) Temp Source: Oral (08/26 0730) BP: 82/65 (08/26 0730) Pulse Rate: 60 (08/26 0730)  Labs:  Recent Labs  09/04/15 1002 09/04/15 1709 09/04/15 1931 09/05/15 0831  HGB 15.7  --   --   --   HCT 47.2  --   --   --   PLT 268  --   --   --   HEPARINUNFRC  --   --   --  0.25*  CREATININE 1.72*  --   --   --   TROPONINI <0.03 10.77* 11.18* 15.34*    Estimated Creatinine Clearance: 54.5 mL/min (by C-G formula based on SCr of 1.72 mg/dL).   Medical History: Past Medical History:  Diagnosis Date  . CAD (coronary artery disease)    STEMI-PCI   . Coronary artery disease   . Diabetes mellitus without complication ([place])   . DM2 (diabetes mellitus, type 2) ([place])   . HTN (hypertension)   . Hyperlipemia   . Hyperlipidemia   . Hypertension   . MI, old   . Obesity   . Tobacco use       Assessment: GS is a 60 yo male who was transferred from [facility] for ACS. Heparin was stated at [facility] with a bolus of 4000 units and an initial rate of 1200 units/hr. Hl this AM was 0.25. No problems with the infusion or signs of bleeding  were noted by the nurse . The patient did receive one NS bolus at 0700 prior to the level being collected.CBC stable.    Goal of Therapy:  Heparin level 0.3-0.7 units/ml Monitor platelets by anticoagulation protocol: Yes   Plan:  Increase heparin infusion to 1350 units/hr Check 6 H HL/CBC Daily HL/CBC Monitor for s/sx of bleeding  Pasty Spillers D Pharmacy Resident Pager: [phone] 09/05/2015,10:30 AM

## 2015-09-05 NOTE — Progress Notes (Signed)
  Echocardiogram 2D Echocardiogram has been performed.  Donata Clay 09/05/2015, 9:45 AM

## 2015-09-05 NOTE — Progress Notes (Signed)
CRITICAL VALUE ALERT  Critical value received:  Trop: 15.34  Date of notification:  09/05/2015  Time of notification:  0949  Critical value read back:Yes.    Nurse who received alert:  Clint Lipps   MD notified (1st page):  Dr. Erlinda Hong  Time of first page:  [phone]  MD notified (2nd page):  Time of second page:  Responding MD:  Dr. Erlinda Hong  Time MD responded:

## 2015-09-06 LAB — BASIC METABOLIC PANEL
Anion gap: 10 (ref 5–15)
BUN: 18 mg/dL (ref 6–20)
CO2: 24 mmol/L (ref 22–32)
Calcium: 8.8 mg/dL — ABNORMAL LOW (ref 8.9–10.3)
Chloride: 101 mmol/L (ref 101–111)
Creatinine, Ser: 1.33 mg/dL — ABNORMAL HIGH (ref 0.61–1.24)
GFR calc Af Amer: >60 mL/min (ref >60)
GFR calc non Af Amer: 56 mL/min — ABNORMAL LOW (ref >60)
Glucose, Bld: 241 mg/dL — ABNORMAL HIGH (ref 65–99)
Potassium: 4.1 mmol/L (ref 3.5–5.1)
Sodium: 135 mmol/L (ref 135–145)

## 2015-09-06 LAB — CBC
HCT: 46 % (ref 39.0–52.0)
Hemoglobin: 14.7 g/dL (ref 13.0–17.0)
MCH: 25.9 pg — ABNORMAL LOW (ref 26.0–34.0)
MCHC: 32 g/dL (ref 30.0–36.0)
MCV: 81.1 fL (ref 78.0–100.0)
Platelets: 222 10*3/uL (ref 150–400)
RBC: 5.67 MIL/uL (ref 4.22–5.81)
RDW: 15.8 % — ABNORMAL HIGH (ref 11.5–15.5)
WBC: 11.5 10*3/uL — ABNORMAL HIGH (ref 4.0–10.5)

## 2015-09-06 LAB — URINE CULTURE: Culture: NO GROWTH

## 2015-09-06 LAB — GLUCOSE, CAPILLARY
Glucose-Capillary: 228 mg/dL — ABNORMAL HIGH (ref 65–99)
Glucose-Capillary: 198 mg/dL — ABNORMAL HIGH (ref 65–99)
Glucose-Capillary: 183 mg/dL — ABNORMAL HIGH (ref 65–99)
Glucose-Capillary: 186 mg/dL — ABNORMAL HIGH (ref 65–99)

## 2015-09-06 LAB — HEPARIN LEVEL (UNFRACTIONATED)
HEPARIN UNFRACTIONATED: 0.4 [IU]/mL (ref 0.30–0.70)
Heparin Unfractionated: 0.38 IU/mL (ref 0.30–0.70)

## 2015-09-06 MED ORDER — ALUM & MAG HYDROXIDE-SIMETH 200-200-20 MG/5ML PO SUSP
30.0000 mL | Freq: Once | ORAL | Status: AC
  Administered 2015-09-06: 30 mL via ORAL
  Filled 2015-09-06: qty 30

## 2015-09-06 MED ORDER — ASPIRIN 81 MG PO CHEW
81.0000 mg | CHEWABLE_TABLET | ORAL | Status: AC
  Administered 2015-09-07: 81 mg via ORAL

## 2015-09-06 MED ORDER — SODIUM CHLORIDE 0.9 % WEIGHT BASED INFUSION
1.0000 mL/kg/h | INTRAVENOUS | Status: DC
  Administered 2015-09-06: 1 mL/kg/h via INTRAVENOUS

## 2015-09-06 MED ORDER — CEPHALEXIN 500 MG PO CAPS
500.0000 mg | ORAL_CAPSULE | Freq: Two times a day (BID) | ORAL | Status: AC
  Administered 2015-09-06 – 2015-09-08 (×5): 500 mg via ORAL
  Filled 2015-09-06 (×5): qty 1

## 2015-09-06 MED ORDER — SENNOSIDES-DOCUSATE SODIUM 8.6-50 MG PO TABS
1.0000 | ORAL_TABLET | Freq: Two times a day (BID) | ORAL | Status: DC
  Administered 2015-09-06 – 2015-09-08 (×5): 1 via ORAL
  Filled 2015-09-06 (×5): qty 1

## 2015-09-06 NOTE — Progress Notes (Signed)
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 D/c acei /imdur due to low bp -Given his non-compliance and cocaine use, initially cardiology recommend to treat medically at this time. On 8/27 cardiology recommend to proceed with cardiac cath, unsure timing, will keep npo after midnight in case cath planned on Monday.   chronic systolic chf: euvolemic on exam, not a candidate for betablocker due to cocaine use. Lisinopril d/ced due to Low bp, close monitor volume status, cardiology following   H/o HTN, now hypotension -now sbp in 80's -hold all bp meds,  -may need stress does steroids/pressor if bp continue to be low, he already [facility] iv[facility], he does has h/o systolic chf, will need to be cautious with fluids,   Leukocytosis: No fever, does has low bp, leukocytosis and low bp could all because of NSTEMI,  ua unremarkable, blood culture to r/o infection, cxr unremarkable. He does report a boil on left groin that has bursted. Keflex started in the setting of uncontrolled dm2.  CKD III, cr 1.72 close to baseline  HLD -Not on statin at home. -Start crestor.  Insulin dependent DM -A1C 12.4 -Continue home dose of lantus. -Start SSI.  Cocaine Use -Counseled on  cessation.  Smoking cessation education, he declined nicotine patch   DVT prophylaxis: heparin  Code Status: full code  Family Communication: patient  Disposition Plan: pending Consults called: cardiology    Procedures:  Cardiac cath planned  Antibiotics:  Oral keflex   Objective: BP 105/82 (BP Location: Right Arm)   Pulse 63   Temp 98 F (36.7 C) (Oral)   Resp 20   Ht 5\' 9"  (1.753 m)   Wt 110.5 kg (243 lb 8 oz)   SpO2 97%   BMI 35.96 kg/m   Intake/Output Summary (Last 24 hours) at 09/06/15 0908 Last data filed at 09/06/15 0800  Gross per 24 hour  Intake          1953.38 ml  Output             1900 ml  Net            53.38 ml   Filed Weights   09/04/15 2212 09/05/15 0445 09/06/15 0500  Weight: 108.3 kg (238 lb 12.8 oz) 107.8 kg (237 lb 9.6 oz) 110.5 kg (243 lb 8 oz)    Exam:   General:  NAD  Cardiovascular: RRR  Respiratory: CTABL  Abdomen: Soft/ND/NT, positive BS  Musculoskeletal: No Edema  Neuro: aaox3  Data Reviewed: Basic Metabolic Panel:  Recent Labs Lab 09/04/15 1002 09/06/15 0519  NA 132* 135  K 4.5 4.1  CL 97* 101  CO2 24 24  GLUCOSE 434* 241*  BUN 22* 18  CREATININE 1.72* 1.33*  CALCIUM 9.3 8.8*   Liver Function Tests:  Recent Labs Lab 09/04/15 1002  AST 21  ALT 28  ALKPHOS 89  BILITOT 0.7  PROT 7.9  ALBUMIN 3.8   No results for input(s): LIPASE, AMYLASE in the last 168 hours. No results for input(s): AMMONIA in the last 168 hours. CBC:  Recent Labs Lab 09/04/15 1002 09/05/15 1625 09/06/15 0519  WBC 17.4* 11.3* 11.5*  NEUTROABS 15.1*  --   --   HGB 15.7 13.6 14.7  HCT 47.2 42.6 46.0  MCV 80.0 80.7 81.1  PLT 268 220 222   Cardiac Enzymes:    Recent Labs Lab 09/04/15 1709 09/04/15 1931 09/05/15 0831 09/05/15 1456 09/05/15 2005  TROPONINI 10.77* 11.18* 15.34* 9.93* 9.64*   BNP (last 3 results)  Recent Labs  09/25/14 2007 09/27/14 2052  BNP 47.0 50.5    ProBNP (last 3 results) No  results for input(s): PROBNP in the last 8760 hours.  CBG:  Recent Labs Lab 09/05/15 0726 09/05/15 1132 09/05/15 1640 09/05/15 2115 09/06/15 0727  GLUCAP 207* 194* 240* 109* 228*    Recent Results (from the past 240 hour(s))  MRSA PCR Screening     Status: None   Collection Time: 09/04/15 10:10 PM  Result Value Ref Range Status   MRSA by PCR NEGATIVE NEGATIVE Final    Comment:        The GeneXpert MRSA Assay (FDA approved for NASAL specimens only), is one component of a comprehensive MRSA colonization surveillance program. It is not intended to diagnose MRSA infection nor to guide or monitor treatment for MRSA infections.      Studies: No results found.  Scheduled Meds: . [START ON 09/07/2015] aspirin  81 mg Oral Pre-Cath  . aspirin EC  81 mg Oral Daily  . glipiZIDE  5 mg Oral BID WC  . insulin aspart  0-15 Units Subcutaneous TID WC  . insulin aspart  0-5 Units Subcutaneous QHS  . insulin aspart  4 Units Subcutaneous TID WC  . insulin glargine  30 Units Subcutaneous QHS  . prasugrel  10 mg Oral Daily  . rosuvastatin  5 mg Oral Daily  . Vitamin D (Ergocalciferol)  50,000 Units Oral Q7 days    Continuous Infusions: . sodium chloride    . heparin 1,550 Units/hr (09/05/15 2250)     Time spent: 40mins  Obert Espindola MD, PhD  [facility] Pager [phone]. If 7PM-7AM, please contact night-coverage at [email or web address], password [facility] 09/06/2015, 9:08 AM  LOS: 1 day

## 2015-09-06 NOTE — Progress Notes (Signed)
ANTICOAGULATION CONSULT NOTE  Pharmacy Consult for heparin Indication: chest pain/ACS  No Known Allergies  Patient Measurements: Height: 5\' 9"  (175.3 cm) Weight: 243 lb 8 oz (110.5 kg) IBW/kg (Calculated) : 70.7 Heparin Dosing Weight: 94.4  Vital Signs: Temp: 97.9 F (36.6 C) (08/27 1132) Temp Source: Oral (08/27 1132) BP: 90/59 (08/27 1132) Pulse Rate: 70 (08/27 1132)  Labs:  Recent Labs  09/04/15 1002  09/05/15 0831 09/05/15 1456 09/05/15 1625 09/05/15 2005 09/06/15 0519 09/06/15 1041  HGB 15.7  --   --   --  13.6  --  14.7  --   HCT 47.2  --   --   --  42.6  --  46.0  --   PLT 268  --   --   --  220  --  222  --   HEPARINUNFRC  --   < > 0.25*  --  0.23*  --  0.40 0.38  CREATININE 1.72*  --   --   --   --   --  1.33*  --   TROPONINI <0.03  < > 15.34* 9.93*  --  9.64*  --   --   < > = values in this interval not displayed.  Estimated Creatinine Clearance: 71.4 mL/min (by C-G formula based on SCr of 1.33 mg/dL).   Assessment: GS is a 60 yo male who was transferred from [facility] for ACS. Troponins are elevated.  No bleeding was noted and CBC remains stable.  Heparin level therapeutic on 1550 units/hr.  There have been 2 therapeutic levels at this dose. Will continue this rate and follow-up plan for cardiac catherization tomorrow.   Goal of Therapy:  Heparin level 0.3-0.7 units/ml Monitor platelets by anticoagulation protocol: Yes   Plan:  Continue heparin at 1550 units/hr  Daily heparin level and CBC Monitor for s/sx of bleeding F/U cardiac work-up and cardiac catherization.  Uvaldo Bristle, Sherian Rein D Pharmacy Resident  Pager: [phone] 09/06/2015,11:56 AM     I discussed / reviewed the pharmacy note by Dr. Nicole Kindred and I agree with the resident's findings and plans as documented.  [employer], Pharm.D., BCPS Clinical Pharmacist Pager [phone] 09/06/2015 12:14 PM

## 2015-09-06 NOTE — Progress Notes (Signed)
ANTICOAGULATION CONSULT NOTE  Pharmacy Consult for heparin Indication: chest pain/ACS  No Known Allergies  Patient Measurements: Height: 5\' 9"  (175.3 cm) Weight: 243 lb 8 oz (110.5 kg) IBW/kg (Calculated) : 70.7 Heparin Dosing Weight: 94.4  Vital Signs: Temp: 98 F (36.7 C) (08/27 0728) Temp Source: Oral (08/27 0728) BP: 105/82 (08/27 0728) Pulse Rate: 63 (08/27 0500)  Labs:  Recent Labs  09/04/15 1002  09/05/15 0831 09/05/15 1456 09/05/15 1625 09/05/15 2005 09/06/15 0519  HGB 15.7  --   --   --  13.6  --  14.7  HCT 47.2  --   --   --  42.6  --  46.0  PLT 268  --   --   --  220  --  222  HEPARINUNFRC  --   --  0.25*  --  0.23*  --  0.40  CREATININE 1.72*  --   --   --   --   --  1.33*  TROPONINI <0.03  < > 15.34* 9.93*  --  9.64*  --   < > = values in this interval not displayed.  Estimated Creatinine Clearance: 71.4 mL/min (by C-G formula based on SCr of 1.33 mg/dL).   Assessment: [name] is a 60 yo male who was transferred from [facility] for ACS. Troponins are elevated.  No bleeding was noted.  Heparin level therapeutic on 1550 units/hr.  This is first therapeutic level.  Will order additional level this PM to confirm.   Goal of Therapy:  Heparin level 0.3-0.7 units/ml Monitor platelets by anticoagulation protocol: Yes   Plan:  Continue heparin at 1550 units/hr  Daily heparin level and CBC Heparin level in 6 hours for confirmation Monitor for s/sx of bleeding F/U cardiac work-up  Uvaldo Bristle, Sherian Rein D Pharmacy Resident  Pager: [phone] 09/06/2015,7:56 AM   I discussed / reviewed the pharmacy note by Dr. Nicole Kindred and I agree with the resident's findings and plans as documented.  Specifically, addressed the need for confirmation heparin level this PM.  Horton Chin, Pharm.D., BCPS Clinical Pharmacist Pager [phone] 09/06/2015 9:08 AM

## 2015-09-06 NOTE — Progress Notes (Signed)
Subjective:  No chest pain overnight.  Complaining of some back pain.  Objective:  Vital Signs in the last 24 hours: BP 105/82 (BP Location: Right Arm)   Pulse 63   Temp 98 F (36.7 C) (Oral)   Resp 20   Ht 5\' 9"  (1.753 m)   Wt 110.5 kg (243 lb 8 oz)   SpO2 97%   BMI 35.96 kg/m   Physical Exam: Obese black male in no acute distress Lungs:  Clear Cardiac:  Regular rhythm, normal S1 and S2, no S3 Abdomen:  Soft, nontender, no masses Extremities:  No edema present  Intake/Output from previous day: 08/26 0701 - 08/27 0700 In: 1963.4 [P.O.:1140; I.V.:573.4; IV Piggyback:250] Out: 1900 [Urine:1900]  Weight Filed Weights   09/04/15 2212 09/05/15 0445 09/06/15 0500  Weight: 108.3 kg (238 lb 12.8 oz) 107.8 kg (237 lb 9.6 oz) 110.5 kg (243 lb 8 oz)    Lab Results: Basic Metabolic Panel:  Recent Labs  09/04/15 1002 09/06/15 0519  NA 132* 135  K 4.5 4.1  CL 97* 101  CO2 24 24  GLUCOSE 434* 241*  BUN 22* 18  CREATININE 1.72* 1.33*   CBC:  Recent Labs  09/04/15 1002 09/05/15 1625 09/06/15 0519  WBC 17.4* 11.3* 11.5*  NEUTROABS 15.1*  --   --   HGB 15.7 13.6 14.7  HCT 47.2 42.6 46.0  MCV 80.0 80.7 81.1  PLT 268 220 222   Cardiac Enzymes: Cardiac Panel (last 3 results)  Recent Labs  09/05/15 0831 09/05/15 1456 09/05/15 2005  TROPONINI 15.34* 9.93* 9.64*    Telemetry: Sinus rhythm  Assessment/Plan:  1.  Non-STEMI in the setting of previous coronary artery disease 2.  CAD with previous stent 3.  Active cocaine use  Recommendations:  Renal function has improved and plan catheterization tomorrow.Cardiac catheterization was discussed with the patient fully including risks of myocardial infarction, death, stroke, bleeding, arrhythmia, dye allergy, renal insufficiency or bleeding.  The patient understands and is willing to proceed.  Possibility of intervention at the same time also discussed with patient and he understands and is agreeable to  proceed       W. Doristine Church  MD [facility]  09/06/2015, 8:38 AM

## 2015-09-07 ENCOUNTER — Encounter (HOSPITAL_COMMUNITY): Payer: Self-pay | Admitting: Cardiology

## 2015-09-07 ENCOUNTER — Encounter (HOSPITAL_COMMUNITY)
Admission: EM | Disposition: A | Discharge status: Home / Self Care | Payer: Self-pay | Source: Home / Self Care | Attending: Internal Medicine

## 2015-09-07 DIAGNOSIS — I25110 Atherosclerotic heart disease of native coronary artery with unstable angina pectoris: Secondary | ICD-10-CM

## 2015-09-07 DIAGNOSIS — I214 Non-ST elevation (NSTEMI) myocardial infarction: Secondary | ICD-10-CM

## 2015-09-07 HISTORY — PX: CARDIAC CATHETERIZATION: SHX172

## 2015-09-07 LAB — BASIC METABOLIC PANEL
ANION GAP: 7 (ref 5–15)
BUN: 12 mg/dL (ref 6–20)
CHLORIDE: 102 mmol/L (ref 101–111)
CO2: 24 mmol/L (ref 22–32)
Calcium: 8.5 mg/dL — ABNORMAL LOW (ref 8.9–10.3)
Creatinine, Ser: 1.15 mg/dL (ref 0.61–1.24)
GFR calc non Af Amer: >60 mL/min (ref >60)
Glucose, Bld: 237 mg/dL — ABNORMAL HIGH (ref 65–99)
Potassium: 3.9 mmol/L (ref 3.5–5.1)
Sodium: 133 mmol/L — ABNORMAL LOW (ref 135–145)

## 2015-09-07 LAB — CBC
HEMATOCRIT: 43.6 % (ref 39.0–52.0)
Hemoglobin: 13.7 g/dL (ref 13.0–17.0)
MCH: 25.5 pg — AB (ref 26.0–34.0)
MCHC: 31.4 g/dL (ref 30.0–36.0)
MCV: 81 fL (ref 78.0–100.0)
Platelets: 188 10*3/uL (ref 150–400)
RBC: 5.38 MIL/uL (ref 4.22–5.81)
RDW: 15.7 % — AB (ref 11.5–15.5)
WBC: 8.3 10*3/uL (ref 4.0–10.5)

## 2015-09-07 LAB — POCT ACTIVATED CLOTTING TIME
ACTIVATED CLOTTING TIME: 224 s
ACTIVATED CLOTTING TIME: 279 s
Activated Clotting Time: 158 seconds

## 2015-09-07 LAB — GLUCOSE, CAPILLARY
GLUCOSE-CAPILLARY: 204 mg/dL — AB (ref 65–99)
GLUCOSE-CAPILLARY: 128 mg/dL — AB (ref 65–99)
GLUCOSE-CAPILLARY: 126 mg/dL — AB (ref 65–99)
Glucose-Capillary: 155 mg/dL — ABNORMAL HIGH (ref 65–99)

## 2015-09-07 LAB — PROTIME-INR
INR: 1.05
Prothrombin Time: 13.8 seconds (ref 11.4–15.2)

## 2015-09-07 LAB — HEPARIN LEVEL (UNFRACTIONATED): Heparin Unfractionated: 0.44 IU/mL (ref 0.30–0.70)

## 2015-09-07 SURGERY — LEFT HEART CATH AND CORONARY ANGIOGRAPHY
Anesthesia: LOCAL

## 2015-09-07 MED ORDER — LIDOCAINE HCL (PF) 1 % IJ SOLN
INTRAMUSCULAR | Status: DC | PRN
  Administered 2015-09-07: 18 mL via INTRADERMAL

## 2015-09-07 MED ORDER — MIDAZOLAM HCL 2 MG/2ML IJ SOLN
INTRAMUSCULAR | Status: DC | PRN
  Administered 2015-09-07 (×2): 1 mg via INTRAVENOUS

## 2015-09-07 MED ORDER — LIDOCAINE HCL (PF) 1 % IJ SOLN
INTRAMUSCULAR | Status: AC
  Filled 2015-09-07: qty 30

## 2015-09-07 MED ORDER — SODIUM CHLORIDE 0.9 % IV SOLN
INTRAVENOUS | Status: AC
  Administered 2015-09-07: 14:00:00 via INTRAVENOUS

## 2015-09-07 MED ORDER — PRASUGREL HCL 10 MG PO TABS
ORAL_TABLET | ORAL | Status: DC | PRN
  Administered 2015-09-07: 50 mg via ORAL

## 2015-09-07 MED ORDER — IOPAMIDOL (ISOVUE-370) INJECTION 76%
INTRAVENOUS | Status: AC
  Filled 2015-09-07: qty 200

## 2015-09-07 MED ORDER — HEPARIN (PORCINE) IN NACL 2-0.9 UNIT/ML-% IJ SOLN
INTRAMUSCULAR | Status: DC | PRN
  Administered 2015-09-07: 1000 mL

## 2015-09-07 MED ORDER — HEPARIN SODIUM (PORCINE) 1000 UNIT/ML IJ SOLN
INTRAMUSCULAR | Status: DC | PRN
  Administered 2015-09-07: 2000 [IU] via INTRAVENOUS
  Administered 2015-09-07: 8000 [IU] via INTRAVENOUS

## 2015-09-07 MED ORDER — SODIUM CHLORIDE 0.9 % IV SOLN: 250.0000 mL | INTRAVENOUS | Status: DC | PRN

## 2015-09-07 MED ORDER — MORPHINE SULFATE (PF) 2 MG/ML IV SOLN
2.0000 mg | Freq: Once | INTRAVENOUS | Status: AC
  Administered 2015-09-07: 2 mg via INTRAVENOUS
  Filled 2015-09-07: qty 1

## 2015-09-07 MED ORDER — ENOXAPARIN SODIUM 40 MG/0.4ML ~~LOC~~ SOLN: 40.0000 mg | Freq: Every day | SUBCUTANEOUS | Status: DC

## 2015-09-07 MED ORDER — IOPAMIDOL (ISOVUE-370) INJECTION 76%
INTRAVENOUS | Status: DC | PRN
  Administered 2015-09-07: 170 mL via INTRA_ARTERIAL

## 2015-09-07 MED ORDER — ACETAMINOPHEN 325 MG PO TABS
650.0000 mg | ORAL_TABLET | ORAL | Status: DC | PRN
  Administered 2015-09-08: 650 mg via ORAL
  Filled 2015-09-07: qty 2

## 2015-09-07 MED ORDER — NITROGLYCERIN 1 MG/10 ML FOR IR/CATH LAB
INTRA_ARTERIAL | Status: DC | PRN
  Administered 2015-09-07: 200 ug via INTRACORONARY

## 2015-09-07 MED ORDER — PRASUGREL HCL 10 MG PO TABS
ORAL_TABLET | ORAL | Status: AC
  Filled 2015-09-07: qty 1

## 2015-09-07 MED ORDER — MIDAZOLAM HCL 2 MG/2ML IJ SOLN
INTRAMUSCULAR | Status: AC
Start: 2015-09-07 — End: 2015-09-07
  Filled 2015-09-07: qty 2

## 2015-09-07 MED ORDER — SODIUM CHLORIDE 0.9% FLUSH: 3.0000 mL | INTRAVENOUS | Status: DC | PRN

## 2015-09-07 MED ORDER — FENTANYL CITRATE (PF) 100 MCG/2ML IJ SOLN
INTRAMUSCULAR | Status: AC
  Filled 2015-09-07: qty 2

## 2015-09-07 MED ORDER — HEPARIN (PORCINE) IN NACL 2-0.9 UNIT/ML-% IJ SOLN
INTRAMUSCULAR | Status: AC
  Filled 2015-09-07: qty 1000

## 2015-09-07 MED ORDER — FENTANYL CITRATE (PF) 100 MCG/2ML IJ SOLN
INTRAMUSCULAR | Status: DC | PRN
  Administered 2015-09-07 (×3): 25 ug via INTRAVENOUS
  Administered 2015-09-07 (×2): 50 ug via INTRAVENOUS
  Administered 2015-09-07: 25 ug via INTRAVENOUS

## 2015-09-07 MED ORDER — SODIUM CHLORIDE 0.9% FLUSH
3.0000 mL | Freq: Two times a day (BID) | INTRAVENOUS | Status: DC
  Administered 2015-09-08: 3 mL via INTRAVENOUS

## 2015-09-07 MED ORDER — NITROGLYCERIN 1 MG/10 ML FOR IR/CATH LAB
INTRA_ARTERIAL | Status: AC
  Filled 2015-09-07: qty 10

## 2015-09-07 SURGICAL SUPPLY — 17 items
BALLN EMERGE MR 2.5X15 (BALLOONS) ×2
BALLN ~~LOC~~ EUPHORA RX 3.5X20 (BALLOONS) ×2
BALLOON EMERGE MR 2.5X15 (BALLOONS) ×1 IMPLANT
BALLOON ~~LOC~~ EUPHORA RX 3.5X20 (BALLOONS) IMPLANT
CATH INFINITI 5FR MULTPACK ANG (CATHETERS) ×1 IMPLANT
GUIDE CATH RUNWAY 6FR FR4 (CATHETERS) ×1 IMPLANT
KIT ENCORE 26 ADVANTAGE (KITS) ×1 IMPLANT
KIT HEART LEFT (KITS) ×2 IMPLANT
PACK CARDIAC CATHETERIZATION (CUSTOM PROCEDURE TRAY) ×2 IMPLANT
SHEATH PINNACLE 5F 10CM (SHEATH) ×1 IMPLANT
SHEATH PINNACLE 6F 10CM (SHEATH) ×1 IMPLANT
STENT SYNERGY DES 3X28 (Permanent Stent) ×1 IMPLANT
SYR MEDRAD MARK V 150ML (SYRINGE) ×2 IMPLANT
TRANSDUCER W/STOPCOCK (MISCELLANEOUS) ×2 IMPLANT
TUBING CIL FLEX 10 FLL-RA (TUBING) ×2 IMPLANT
WIRE ASAHI PROWATER 180CM (WIRE) ×1 IMPLANT
WIRE EMERALD 3MM-J .035X150CM (WIRE) ×1 IMPLANT

## 2015-09-07 NOTE — Interval H&P Note (Signed)
History and Physical Interval Note:  09/07/2015 12:09 PM  [name]  has presented today for surgery, with the diagnosis of NSTEMI with known CAD - has had PCI to the LAD with redo PCI for in-stent thrombosis. He also significant disease in OM branch and the RCA treated medically.  The various methods of treatment have been discussed with the patient and family. After consideration of risks, benefits and other options for treatment, the patient has consented to  Procedure(s): Left Heart Cath and Coronary Angiography (N/A) With Possible Percutaneous Coronary Intervention as a surgical intervention .  The patient's history has been reviewed, patient examined, no change in status, stable for surgery.  I have reviewed the patient's chart and labs.  Questions were answered to the patient's satisfaction.     Cath Lab Visit (complete for each Cath Lab visit)  Clinical Evaluation Leading to the Procedure:   ACS: Yes.    Non-ACS:    Anginal Classification: CCS IV  Anti-ischemic medical therapy: No Therapy  Non-Invasive Test Results: No non-invasive testing performed  Prior CABG: No previous CABG  TIMI Score  Patient Information:  TIMI Score is 5  Revascularization of the presumed culprit artery  A (9)  Indication: 11; Score: 9 TIMI Score  Patient Information:  TIMI Score is 5  Revascularization of multiple coronary arteries when the culprit artery cannot clearly be determined  A (9)  Indication: 12; Score: 9      Glenetta Hew

## 2015-09-07 NOTE — Progress Notes (Signed)
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 Radford Pax Patient Profile: 60 year old male with a past medical history of CAD ( has PCI to LAD), HTN, ischemic cardiomyopathy EF 45%, DM, HLD, and cocaine abuse, last used the day of admission 09/04/15. Presented with syncope and chest pain. Troponin is 10.77>>11.18>>15.34>>9.93  SUBJECTIVE: Feels ok, had some left arm pain early this am. Denies SOB.    OBJECTIVE Vitals:   09/06/15 0728 09/06/15 1132 09/06/15 2118 09/07/15 0523  BP: 105/82 (!) 90/59 (!) 112/56 113/75  Pulse:  70 70 62  Resp:  20 16 20   Temp: 98 F (36.7 C) 97.9 F (36.6 C) 98.2 F (36.8 C) 98.5 F (36.9 C)  TempSrc: Oral Oral Oral Oral  SpO2:  96% 100% 99%  Weight:    246 lb 14.4 oz (112 kg)  Height:        Intake/Output Summary (Last 24 hours) at 09/07/15 [id number] Last data filed at 09/07/15 0500  Gross per 24 hour  Intake          1474.86 ml  Output                0 ml  Net          1474.86 ml   Filed Weights   09/05/15 0445 09/06/15 0500 09/07/15 0523  Weight: 237 lb 9.6 oz (107.8 kg) 243 lb 8 oz (110.5 kg) 246 lb 14.4 oz (112 kg)    PHYSICAL EXAM General: Well developed, well nourished, male in no acute distress. Head: Normocephalic, atraumatic.  Neck: Supple without bruits, no JVD. Lungs:  Resp regular and unlabored, diminished in bases Heart: RRR, S1, S2, no S3, S4, or murmur; no rub. Abdomen: Soft, protuberant, non-tender, non-distended, BS + x 4.  Extremities: No clubbing, cyanosis, generalized edema.  Neuro: Alert and oriented X 3. Moves all extremities spontaneously. Psych: Normal  affect.  LABS: CBC: Recent Labs  09/04/15 1002  09/06/15 0519 09/07/15 0507  WBC 17.4*  < > 11.5* 8.3  NEUTROABS 15.1*  --   --   --   HGB 15.7  < > 14.7 13.7  HCT 47.2  < > 46.0 43.6  MCV 80.0  < > 81.1 81.0  PLT 268  < > 222 188  < > = values in this interval not displayed. INR: Recent Labs  09/07/15 0646  INR [id number]   Basic Metabolic Panel: Recent Labs  09/06/15 0519 09/07/15 0507  NA 135 133*  K 4.1 3.9  CL 101 102  CO2 24 24  GLUCOSE 241* 237*  BUN 18 12  CREATININE 1.33* 1.15  CALCIUM 8.8* 8.5*   Liver Function Tests: Recent Labs  09/04/15 1002  AST 21  ALT 28  ALKPHOS 89  BILITOT 0.7  PROT 7.9  ALBUMIN 3.8   Cardiac Enzymes: Recent Labs  09/05/15 0831 09/05/15 1456 09/05/15 2005  TROPONINI 15.34* 9.93* 9.64*   Hemoglobin A1C: Recent Labs  09/04/15 1002  HGBA1C 12.4*    Current Facility-Administered Medications:  .  0.9% sodium chloride infusion, 1 mL/kg/hr, Intravenous, Continuous, Evans Lance, MD, Last Rate:  107.8 mL/hr at 09/06/15 2117, 1 mL/kg/hr at 09/06/15 2117 .  acetaminophen (TYLENOL) tablet 650 mg, 650 mg, Oral, Q4H PRN, Erline Hau, MD .  aspirin EC tablet 81 mg, 81 mg, Oral, Daily, Erline Hau, MD, 81 mg at 09/06/15 0804 .  cephALEXin (KEFLEX) capsule 500 mg, 500 mg, Oral, Q12H, Florencia Reasons, MD, 500 mg at 09/07/15 0804 .  glipiZIDE (GLUCOTROL) tablet 5 mg, 5 mg, Oral, BID WC, Estela Leonie Green, MD, 5 mg at 09/06/15 1639 .  heparin ADULT infusion 100 units/mL (25000 units/262mL sodium chloride 0.45%), 1,550 Units/hr, Intravenous, Continuous, Florencia Reasons, MD, Last Rate: 15.5 mL/hr at 09/07/15 0801, 1,550 Units/hr at 09/07/15 0801 .  insulin aspart (novoLOG) injection 0-15 Units, 0-15 Units, Subcutaneous, TID WC, Estela Leonie Green, MD, 5 Units at 09/07/15 0802 .  insulin aspart (novoLOG) injection 0-5 Units, 0-5 Units, Subcutaneous, QHS, Estela Leonie Green, MD, 2 Units at 09/04/15 2330 .   insulin aspart (novoLOG) injection 4 Units, 4 Units, Subcutaneous, TID WC, Estela Leonie Green, MD, 4 Units at 09/06/15 1700 .  insulin glargine (LANTUS) injection 30 Units, 30 Units, Subcutaneous, QHS, Erline Hau, MD, 15 Units at 09/06/15 2130 .  morphine 2 MG/ML injection 1 mg, 1 mg, Intravenous, Q4H PRN, Erline Hau, MD, 1 mg at 09/05/15 0353 .  nitroGLYCERIN (NITROSTAT) SL tablet 0.4 mg, 0.4 mg, Sublingual, Q5 Min x 3 PRN, Erline Hau, MD .  ondansetron [facility]) injection 4 mg, 4 mg, Intravenous, Q6H PRN, Erline Hau, MD .  oxyCODONE-acetaminophen (PERCOCET/ROXICET) 5-325 MG per tablet 1 tablet, 1 tablet, Oral, Q8H PRN, Florencia Reasons, MD, 1 tablet at 09/07/15 [phone] .  prasugrel (EFFIENT) tablet 10 mg, 10 mg, Oral, Daily, Erline Hau, MD, 10 mg at 09/07/15 0803 .  rosuvastatin (CRESTOR) tablet 5 mg, 5 mg, Oral, Daily, Florencia Reasons, MD, 5 mg at 09/07/15 0804 .  senna-docusate (Senokot-S) tablet 1 tablet, 1 tablet, Oral, BID, Florencia Reasons, MD, 1 tablet at 09/07/15 0803 .  Vitamin D (Ergocalciferol) (DRISDOL) capsule 50,000 Units, 50,000 Units, Oral, Q7 days, Erline Hau, MD, 50,000 Units at 09/05/15 [phone] . sodium chloride 1 mL/kg/hr (09/06/15 2117)  . heparin 1,550 Units/hr (09/07/15 0801)    TELE:   NSR     ECG:  NSR, anterolateral ST changes concerning for ischemia.   Left heart cath 2015: IMPRESSIONS:  1. Patent left main coronary artery. 2. Late stent thrombosis of the mid left anterior descending arter stent. This was successfully treated with aspiration thrombectomy and then a 3.0 x 24 Promus drug-eluting stent placement, postdilated to 3.5 mm.  3. Mild disease in the left circumflex artery and large OM1. In the OM 2, there is severe disease.. 4. Moderate mid right coronary artery disease. Severe disease in the distal RCA. 5. Did not assess left ventricular systolic function.  I anticipate it will be  significantly decreased. LVEDP 27 mmHg.    RECOMMENDATION:  Continue dual antiplatelet therapy for at least a year and likely beyond. Will switch to Effient tomorrow.  Brilinta was given at the time of the cath. Would like him to be on a once a day drug to help ensure compliance. He states that he had stopped all of his medicines last year do to headaches. He is trying to figure out which medication was causing the headaches.  There was significant tortuosity in the right subclavian artery making torquing catheters difficult  from the wrist.  Would plan groin access in the future, particularly for RCA intervention if needed.  He'll be watched in the CCU. Continue bivalirudin for 2 hours.  Cardiology followup will be with Dr. Radford Pax once he is discharged.    Current Medications:  . aspirin EC  81 mg Oral Daily  . cephALEXin  500 mg Oral Q12H  . glipiZIDE  5 mg Oral BID WC  . insulin aspart  0-15 Units Subcutaneous TID WC  . insulin aspart  0-5 Units Subcutaneous QHS  . insulin aspart  4 Units Subcutaneous TID WC  . insulin glargine  30 Units Subcutaneous QHS  . prasugrel  10 mg Oral Daily  . rosuvastatin  5 mg Oral Daily  . senna-docusate  1 tablet Oral BID  . Vitamin D (Ergocalciferol)  50,000 Units Oral Q7 days   . sodium chloride 1 mL/kg/hr (09/06/15 2117)  . 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 NSTEMI: Presented to [facility] ED on 09/04/15 with syncope, developed chest pain soon after. EKG cocnerning for anterolateral ischemia. Patient used crack cocaine from 11pm to 8 am the night prior to admission. He has a history of CAD, had STEMI in Aug.  2015. At that time he was in cardiogenic shock, found to have thrombosis in previously placed LAD stent. He had diffuse diabetic CAD with distal vessel disease pattern involving the distal RCA, PDA, diagonal branches and obtuse marginal branches.   He will need cardiac catheterization. He is on the schedule for today. NOTE: in report from 2015 there was significant tortuosity in the R subclavian artery, groin access wold be best.  Continue heparin gtt. He has a history of non compliance with anti-platelet therapy following PCI. Currently on Effient that he obtains from the [place]. He tells me that he will sometimes go a few days to a month without taking. He says that he just doesn't like to take a lot of medicines. No beta blocker in setting of recent cocaine use.    2. AKI: Creatinine was elevated on admission, has normalized now.   3. HTN: Hypotensive to normotensive. No room to add ACE-I.   4. Ischemic cardiomyopathy: EF 45-50% this admission. There is hypokinesis of the mid-apicalinferior and apical myocardium and hypokinesis of the apicalanteroseptal myocardium. This is not noted on Echo from 2015. EF is unchanged from 2015. Discussed with patient the importance of cocaine cessation as it will likely kill him.    5. DM: A1c is 12.4. Continue SSI and sulfonylurea. Renal function is stable today.   Signed, Arbutus Leas , NP 8:33 AM 09/07/2015 Pager [phone]  Patient seen, examined. Available data reviewed. Agree with findings, assessment, and plan as outlined by [name], NP. The patient is independently interviewed and examined. He presents now with a non-ST elevation infarction based on cardiac enzyme pattern in the setting of uncontrolled diabetes, tobacco use, and crack cocaine use. He has known coronary artery disease with previous LAD stenting and diffuse distal vessel disease in a diabetic pattern. Last heart catheterization from 2015 was reviewed. Recommend left radial artery or  groin access for cath. I  have reviewed the risks, indications, and alternatives to cardiac catheterization, possible angioplasty, and stenting with the patient. Risks include but are not limited to bleeding, infection, vascular injury, stroke, myocardial infection, arrhythmia, kidney injury, radiation-related injury in the case of prolonged fluoroscopy use, emergency cardiac surgery, and death. The patient understands the risks of serious complication is 1-2 in [id number] with diagnostic cardiac cath and 1-2% or less with angioplasty/stenting. He is counseled about need to stop tobacco and cocaine. He is medically noncompliant and is counseled about medication compliance.   Disposition: pending cath results.  Sherren Mocha, M.D. 09/07/2015 10:37 AM

## 2015-09-07 NOTE — Care Management Note (Signed)
Case Management Note  Patient Details  Name: [name] MRN: [id number] Date of Birth: [birth year]  Subjective/Objective:    Patient s/p coronary stent intervention, NCM awaiting benefit check for effient 10mg  daily.                  Action/Plan:   Expected Discharge Date:  09/07/15               Expected Discharge Plan:  Home/Self Care  In-House Referral:     Discharge planning Services  CM Consult  Post Acute Care Choice:    Choice offered to:     DME Arranged:    DME Agency:     HH Arranged:    HH Agency:     Status of Service:  In process, will continue to follow  If discussed at Long Length of Stay Meetings, dates discussed:    Additional Comments:  Zenon Mayo, RN 09/07/2015, 3:11 PM

## 2015-09-07 NOTE — H&P (View-Only) (Signed)
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 Radford Pax Patient Profile: 60 year old male with a past medical history of CAD ( has PCI to LAD), HTN, ischemic cardiomyopathy EF 45%, DM, HLD, and cocaine abuse, last used the day of admission 09/04/15. Presented with syncope and chest pain. Troponin is 10.77>>11.18>>15.34>>9.93  SUBJECTIVE: Feels ok, had some left arm pain early this am. Denies SOB.    OBJECTIVE Vitals:   09/06/15 0728 09/06/15 1132 09/06/15 2118 09/07/15 0523  BP: 105/82 (!) 90/59 (!) 112/56 113/75  Pulse:  70 70 62  Resp:  20 16 20   Temp: 98 F (36.7 C) 97.9 F (36.6 C) 98.2 F (36.8 C) 98.5 F (36.9 C)  TempSrc: Oral Oral Oral Oral  SpO2:  96% 100% 99%  Weight:    246 lb 14.4 oz (112 kg)  Height:        Intake/Output Summary (Last 24 hours) at 09/07/15 [id number] Last data filed at 09/07/15 0500  Gross per 24 hour  Intake          1474.86 ml  Output                0 ml  Net          1474.86 ml   Filed Weights   09/05/15 0445 09/06/15 0500 09/07/15 0523  Weight: 237 lb 9.6 oz (107.8 kg) 243 lb 8 oz (110.5 kg) 246 lb 14.4 oz (112 kg)    PHYSICAL EXAM General: Well developed, well nourished, male in no acute distress. Head: Normocephalic, atraumatic.  Neck: Supple without bruits, no JVD. Lungs:  Resp regular and unlabored, diminished in bases Heart: RRR, S1, S2, no S3, S4, or murmur; no rub. Abdomen: Soft, protuberant, non-tender, non-distended, BS + x 4.  Extremities: No clubbing, cyanosis, generalized edema.  Neuro: Alert and oriented X 3. Moves all extremities spontaneously. Psych: Normal  affect.  LABS: CBC: Recent Labs  09/04/15 1002  09/06/15 0519 09/07/15 0507  WBC 17.4*  < > 11.5* 8.3  NEUTROABS 15.1*  --   --   --   HGB 15.7  < > 14.7 13.7  HCT 47.2  < > 46.0 43.6  MCV 80.0  < > 81.1 81.0  PLT 268  < > 222 188  < > = values in this interval not displayed. INR: Recent Labs  09/07/15 0646  INR [id number]   Basic Metabolic Panel: Recent Labs  09/06/15 0519 09/07/15 0507  NA 135 133*  K 4.1 3.9  CL 101 102  CO2 24 24  GLUCOSE 241* 237*  BUN 18 12  CREATININE 1.33* 1.15  CALCIUM 8.8* 8.5*   Liver Function Tests: Recent Labs  09/04/15 1002  AST 21  ALT 28  ALKPHOS 89  BILITOT 0.7  PROT 7.9  ALBUMIN 3.8   Cardiac Enzymes: Recent Labs  09/05/15 0831 09/05/15 1456 09/05/15 2005  TROPONINI 15.34* 9.93* 9.64*   Hemoglobin A1C: Recent Labs  09/04/15 1002  HGBA1C 12.4*    Current Facility-Administered Medications:  .  0.9% sodium chloride infusion, 1 mL/kg/hr, Intravenous, Continuous, Evans Lance, MD, Last Rate:  107.8 mL/hr at 09/06/15 2117, 1 mL/kg/hr at 09/06/15 2117 .  acetaminophen (TYLENOL) tablet 650 mg, 650 mg, Oral, Q4H PRN, Erline Hau, MD .  aspirin EC tablet 81 mg, 81 mg, Oral, Daily, Erline Hau, MD, 81 mg at 09/06/15 0804 .  cephALEXin (KEFLEX) capsule 500 mg, 500 mg, Oral, Q12H, Florencia Reasons, MD, 500 mg at 09/07/15 0804 .  glipiZIDE (GLUCOTROL) tablet 5 mg, 5 mg, Oral, BID WC, Estela Leonie Green, MD, 5 mg at 09/06/15 1639 .  heparin ADULT infusion 100 units/mL (25000 units/242mL sodium chloride 0.45%), 1,550 Units/hr, Intravenous, Continuous, Florencia Reasons, MD, Last Rate: 15.5 mL/hr at 09/07/15 0801, 1,550 Units/hr at 09/07/15 0801 .  insulin aspart (novoLOG) injection 0-15 Units, 0-15 Units, Subcutaneous, TID WC, Estela Leonie Green, MD, 5 Units at 09/07/15 0802 .  insulin aspart (novoLOG) injection 0-5 Units, 0-5 Units, Subcutaneous, QHS, Estela Leonie Green, MD, 2 Units at 09/04/15 2330 .   insulin aspart (novoLOG) injection 4 Units, 4 Units, Subcutaneous, TID WC, Estela Leonie Green, MD, 4 Units at 09/06/15 1700 .  insulin glargine (LANTUS) injection 30 Units, 30 Units, Subcutaneous, QHS, Erline Hau, MD, 15 Units at 09/06/15 2130 .  morphine 2 MG/ML injection 1 mg, 1 mg, Intravenous, Q4H PRN, Erline Hau, MD, 1 mg at 09/05/15 0353 .  nitroGLYCERIN (NITROSTAT) SL tablet 0.4 mg, 0.4 mg, Sublingual, Q5 Min x 3 PRN, Erline Hau, MD .  ondansetron [facility]) injection 4 mg, 4 mg, Intravenous, Q6H PRN, Erline Hau, MD .  oxyCODONE-acetaminophen (PERCOCET/ROXICET) 5-325 MG per tablet 1 tablet, 1 tablet, Oral, Q8H PRN, Florencia Reasons, MD, 1 tablet at 09/07/15 [phone] .  prasugrel (EFFIENT) tablet 10 mg, 10 mg, Oral, Daily, Erline Hau, MD, 10 mg at 09/07/15 0803 .  rosuvastatin (CRESTOR) tablet 5 mg, 5 mg, Oral, Daily, Florencia Reasons, MD, 5 mg at 09/07/15 0804 .  senna-docusate (Senokot-S) tablet 1 tablet, 1 tablet, Oral, BID, Florencia Reasons, MD, 1 tablet at 09/07/15 0803 .  Vitamin D (Ergocalciferol) (DRISDOL) capsule 50,000 Units, 50,000 Units, Oral, Q7 days, Erline Hau, MD, 50,000 Units at 09/05/15 [phone] . sodium chloride 1 mL/kg/hr (09/06/15 2117)  . heparin 1,550 Units/hr (09/07/15 0801)    TELE:   NSR     ECG:  NSR, anterolateral ST changes concerning for ischemia.   Left heart cath 2015: IMPRESSIONS:  1. Patent left main coronary artery. 2. Late stent thrombosis of the mid left anterior descending arter stent. This was successfully treated with aspiration thrombectomy and then a 3.0 x 24 Promus drug-eluting stent placement, postdilated to 3.5 mm.  3. Mild disease in the left circumflex artery and large OM1. In the OM 2, there is severe disease.. 4. Moderate mid right coronary artery disease. Severe disease in the distal RCA. 5. Did not assess left ventricular systolic function.  I anticipate it will be  significantly decreased. LVEDP 27 mmHg.    RECOMMENDATION:  Continue dual antiplatelet therapy for at least a year and likely beyond. Will switch to Effient tomorrow.  Brilinta was given at the time of the cath. Would like him to be on a once a day drug to help ensure compliance. He states that he had stopped all of his medicines last year do to headaches. He is trying to figure out which medication was causing the headaches.  There was significant tortuosity in the right subclavian artery making torquing catheters difficult  from the wrist.  Would plan groin access in the future, particularly for RCA intervention if needed.  He'll be watched in the CCU. Continue bivalirudin for 2 hours.  Cardiology followup will be with Dr. Radford Pax once he is discharged.    Current Medications:  . aspirin EC  81 mg Oral Daily  . cephALEXin  500 mg Oral Q12H  . glipiZIDE  5 mg Oral BID WC  . insulin aspart  0-15 Units Subcutaneous TID WC  . insulin aspart  0-5 Units Subcutaneous QHS  . insulin aspart  4 Units Subcutaneous TID WC  . insulin glargine  30 Units Subcutaneous QHS  . prasugrel  10 mg Oral Daily  . rosuvastatin  5 mg Oral Daily  . senna-docusate  1 tablet Oral BID  . Vitamin D (Ergocalciferol)  50,000 Units Oral Q7 days   . sodium chloride 1 mL/kg/hr (09/06/15 2117)  . 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 NSTEMI: Presented to [facility] ED on 09/04/15 with syncope, developed chest pain soon after. EKG cocnerning for anterolateral ischemia. Patient used crack cocaine from 11pm to 8 am the night prior to admission. He has a history of CAD, had STEMI in Aug.  2015. At that time he was in cardiogenic shock, found to have thrombosis in previously placed LAD stent. He had diffuse diabetic CAD with distal vessel disease pattern involving the distal RCA, PDA, diagonal branches and obtuse marginal branches.   He will need cardiac catheterization. He is on the schedule for today. NOTE: in report from 2015 there was significant tortuosity in the R subclavian artery, groin access wold be best.  Continue heparin gtt. He has a history of non compliance with anti-platelet therapy following PCI. Currently on Effient that he obtains from the [place]. He tells me that he will sometimes go a few days to a month without taking. He says that he just doesn't like to take a lot of medicines. No beta blocker in setting of recent cocaine use.    2. AKI: Creatinine was elevated on admission, has normalized now.   3. HTN: Hypotensive to normotensive. No room to add ACE-I.   4. Ischemic cardiomyopathy: EF 45-50% this admission. There is hypokinesis of the mid-apicalinferior and apical myocardium and hypokinesis of the apicalanteroseptal myocardium. This is not noted on Echo from 2015. EF is unchanged from 2015. Discussed with patient the importance of cocaine cessation as it will likely kill him.    5. DM: A1c is 12.4. Continue SSI and sulfonylurea. Renal function is stable today.   Signed, Arbutus Leas , NP 8:33 AM 09/07/2015 Pager [phone]  Patient seen, examined. Available data reviewed. Agree with findings, assessment, and plan as outlined by [name], NP. The patient is independently interviewed and examined. He presents now with a non-ST elevation infarction based on cardiac enzyme pattern in the setting of uncontrolled diabetes, tobacco use, and crack cocaine use. He has known coronary artery disease with previous LAD stenting and diffuse distal vessel disease in a diabetic pattern. Last heart catheterization from 2015 was reviewed. Recommend left radial artery or  groin access for cath. I  have reviewed the risks, indications, and alternatives to cardiac catheterization, possible angioplasty, and stenting with the patient. Risks include but are not limited to bleeding, infection, vascular injury, stroke, myocardial infection, arrhythmia, kidney injury, radiation-related injury in the case of prolonged fluoroscopy use, emergency cardiac surgery, and death. The patient understands the risks of serious complication is 1-2 in [id number] with diagnostic cardiac cath and 1-2% or less with angioplasty/stenting. He is counseled about need to stop tobacco and cocaine. He is medically noncompliant and is counseled about medication compliance.   Disposition: pending cath results.  Sherren Mocha, M.D. 09/07/2015 10:37 AM

## 2015-09-07 NOTE — Progress Notes (Signed)
PROGRESS NOTE  [name] [id number] DOB: Jul 24, 1955 DOA: 09/04/2015 PCP: [name], MD  HPI/Recap of past 24 hours:   Returned from cardiac cath, denies chest pain, no sob, vital stable, wife in room.   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 D/c acei /imdur due to low bp -Given his non-compliance and cocaine use, initially cardiology recommend to treat medically at this time.  Due to significant troponin elevation, cardiac cath with stenting on 8/28.   chronic systolic chf: euvolemic on exam, not a candidate for betablocker due to cocaine use. Lisinopril d/ced due to Low bp, close monitor volume status, cardiology following   H/o HTN, now hypotension -sbp in 80's initial on presentation, he received fluids at [facility] prior to transfer to [place] -all bp meds held since admission -bp better, remain off bp meds, may be able to resume bp meds gradually.  Leukocytosis: No fever, does has low bp, leukocytosis and low bp could all because of NSTEMI,  ua unremarkable, blood culture to r/o infection, cxr unremarkable. He does report a boil on left groin that has bursted. Keflex started in the setting of uncontrolled dm2. Wbc normalized.  CKD III, cr 1.72-1.33-1.15  HLD -Not on statin at home. -Start crestor.  Insulin dependent DM -A1C 12.4 -Continue home dose of lantus. -Start SSI.  Cocaine  Use -Counseled on cessation.  Smoking cessation education, he declined nicotine patch   DVT prophylaxis: initially on heparin drip, of heparin drip after cardiac cath, start prophylaxis dose lovenox Code Status: full code  Family Communication: patient and wife at bedside Disposition Plan: likely home in 1-2 days with cardiology clearance Consults called: cardiology    Procedures:  Cardiac cath with stenting on 8/28  Antibiotics:  Oral keflex   Objective: BP (!) 111/92   Pulse (!) 58   Temp 98.4 F (36.9 C) (Oral)   Resp 16   Ht 5\' 9"  (1.753 m)   Wt 112 kg (246 lb 14.4 oz)   SpO2 100%   BMI 36.46 kg/m   Intake/Output Summary (Last 24 hours) at 09/07/15 1914 Last data filed at 09/07/15 1700  Gross per 24 hour  Intake          1468.67 ml  Output             1250 ml  Net           218.67 ml   Filed Weights   09/05/15 0445 09/06/15 0500 09/07/15 0523  Weight: 107.8 kg (237 lb 9.6 oz) 110.5 kg (243 lb 8 oz) 112 kg (246 lb 14.4 oz)    Exam:   General:  NAD  Cardiovascular: RRR  Respiratory: CTABL  Abdomen: Soft/ND/NT, positive BS  Musculoskeletal: No Edema  Neuro: aaox3  Data Reviewed: Basic Metabolic Panel:  Recent Labs Lab 09/04/15 1002 09/06/15 0519 09/07/15 0507  NA 132* 135 133*  K 4.5 4.1 3.9  CL 97* 101 102  CO2 24 24 24   GLUCOSE 434* 241* 237*  BUN 22* 18 12  CREATININE 1.72* 1.33* 1.15  CALCIUM 9.3 8.8* 8.5*   Liver Function Tests:  Recent Labs Lab 09/04/15 1002  AST 21  ALT 28  ALKPHOS 89  BILITOT 0.7  PROT 7.9  ALBUMIN 3.8   No results for input(s): LIPASE, AMYLASE in the last 168 hours. No results for input(s): AMMONIA in the last 168 hours. CBC:  Recent Labs Lab 09/04/15 1002 09/05/15 1625 09/06/15 0519 09/07/15 0507  WBC 17.4* 11.3* 11.5* 8.3  NEUTROABS 15.1*  --   --   --   HGB 15.7 13.6 14.7 13.7  HCT 47.2 42.6 46.0 43.6  MCV 80.0 80.7 81.1 81.0  PLT 268 220 222 188   Cardiac Enzymes:    Recent  Labs Lab 09/04/15 1709 09/04/15 1931 09/05/15 0831 09/05/15 1456 09/05/15 2005  TROPONINI 10.77* 11.18* 15.34* 9.93* 9.64*   BNP (last 3 results)  Recent Labs  09/25/14 2007 09/27/14 2052  BNP 47.0 50.5    ProBNP (last 3 results) No results for input(s): PROBNP in the last 8760 hours.  CBG:  Recent Labs Lab 09/06/15 1643 09/06/15 2115 09/07/15 0712 09/07/15 1117 09/07/15 1717  GLUCAP 183* 186* 204* 128* 126*    Recent Results (from the past 240 hour(s))  MRSA PCR Screening     Status: None   Collection Time: 09/04/15 10:10 PM  Result Value Ref Range Status   MRSA by PCR NEGATIVE NEGATIVE Final    Comment:        The GeneXpert MRSA Assay (FDA approved for NASAL specimens only), is one component of a comprehensive MRSA colonization surveillance program. It is not intended to diagnose MRSA infection nor to guide or monitor treatment for MRSA infections.   Culture, blood (routine x 2)     Status: None (Preliminary result)   Collection Time: 09/05/15  9:30 AM  Result Value Ref Range Status   Specimen Description BLOOD BLOOD LEFT HAND  Final   Special Requests BOTTLES DRAWN AEROBIC ONLY 5CC  Final   Culture NO GROWTH 1 DAY  Final   Report Status PENDING  Incomplete  Culture, blood (routine x 2)     Status: None (Preliminary result)   Collection Time: 09/05/15  9:35 AM  Result Value Ref Range Status   Specimen Description BLOOD LEFT ANTECUBITAL  Final   Special Requests BOTTLES DRAWN AEROBIC ONLY 5CC  Final   Culture NO GROWTH 1 DAY  Final   Report Status PENDING  Incomplete  Urine culture     Status: None   Collection Time: 09/05/15  9:58 AM  Result Value Ref Range Status   Specimen Description URINE, RANDOM  Final   Special Requests NONE  Final   Culture NO GROWTH  Final   Report Status 09/06/2015 FINAL  Final     Studies: No results found.  Scheduled Meds: . aspirin EC  81 mg Oral Daily  . cephALEXin  500 mg Oral Q12H  . glipiZIDE  5 mg Oral  BID WC  . insulin aspart  0-15 Units Subcutaneous TID WC  . insulin aspart  0-5 Units Subcutaneous QHS  . insulin aspart  4 Units Subcutaneous TID WC  . insulin glargine  30 Units Subcutaneous QHS  . prasugrel  10 mg Oral Daily  . rosuvastatin  5 mg Oral Daily  . senna-docusate  1 tablet Oral BID  . sodium chloride flush  3 mL Intravenous Q12H  .  Vitamin D (Ergocalciferol)  50,000 Units Oral Q7 days    Continuous Infusions: . sodium chloride 75 mL/hr at 09/07/15 1400     Time spent: 24mins  Kordel Leavy MD, PhD  [facility] Pager [phone]. If 7PM-7AM, please contact night-coverage at [email or web address], password [facility] 09/07/2015, 7:14 PM  LOS: 2 days

## 2015-09-08 LAB — BASIC METABOLIC PANEL
Anion gap: 5 (ref 5–15)
BUN: 7 mg/dL (ref 6–20)
CALCIUM: 9.1 mg/dL (ref 8.9–10.3)
CO2: 30 mmol/L (ref 22–32)
CREATININE: 1.19 mg/dL (ref 0.61–1.24)
Chloride: 101 mmol/L (ref 101–111)
GFR calc Af Amer: >60 mL/min (ref >60)
GLUCOSE: 175 mg/dL — AB (ref 65–99)
Potassium: 4.3 mmol/L (ref 3.5–5.1)
Sodium: 136 mmol/L (ref 135–145)

## 2015-09-08 LAB — CBC
HCT: 44.8 % (ref 39.0–52.0)
Hemoglobin: 14.5 g/dL (ref 13.0–17.0)
MCH: 26.1 pg (ref 26.0–34.0)
MCHC: 32.4 g/dL (ref 30.0–36.0)
MCV: 80.7 fL (ref 78.0–100.0)
PLATELETS: 216 10*3/uL (ref 150–400)
RBC: 5.55 MIL/uL (ref 4.22–5.81)
RDW: 15.6 % — AB (ref 11.5–15.5)
WBC: 10.1 10*3/uL (ref 4.0–10.5)

## 2015-09-08 LAB — GLUCOSE, CAPILLARY
Glucose-Capillary: 145 mg/dL — ABNORMAL HIGH (ref 65–99)
Glucose-Capillary: 165 mg/dL — ABNORMAL HIGH (ref 65–99)

## 2015-09-08 LAB — TROPONIN I: Troponin I: 3.72 ng/mL (ref <0.03)

## 2015-09-08 MED ORDER — HEART ATTACK BOUNCING BOOK
Freq: Once | Status: AC
  Administered 2015-09-08: 07:00:00
  Filled 2015-09-08: qty 1

## 2015-09-08 MED ORDER — ROSUVASTATIN CALCIUM 5 MG PO TABS: 5.0000 mg | ORAL_TABLET | Freq: Every day | ORAL | 0 refills | Status: DC

## 2015-09-08 NOTE — Care Management Note (Signed)
Case Management Note  Patient Details  Name: [name] MRN: [id number] Date of Birth: 05/22/1955  Subjective/Objective:   Patient has been on effient , will resume, pta indep.  NCM  Will cont to follow for dc needs.                  Action/Plan:   Expected Discharge Date:  09/07/15               Expected Discharge Plan:  Home/Self Care  In-House Referral:     Discharge planning Services  CM Consult  Post Acute Care Choice:    Choice offered to:     DME Arranged:    DME Agency:     HH Arranged:    HH Agency:     Status of Service:  Completed, signed off  If discussed at [employer] of Stay Meetings, dates discussed:    Additional Comments:  Zenon Mayo, RN 09/08/2015, 10:11 AM

## 2015-09-08 NOTE — Progress Notes (Signed)
1. EFFIENT  10 MG DAILY (30 )  NONE FORMULARY  FOR EXCEPTION # [phone]   2, BRILINTA   60 MG  AND 90 MG   COVER- YES  CO- PAY- $ 3.70   Q/L OF 2 PILLS PER DAYS  TIER- 4 DRUG  PRIOR APPROVAL - NO   3. CLOPIDOGREL  75 MG   COVER- YES  CO-PAY- $1.20  Q/L OF 4 PILLS PER DAY  TIER- 2 DRUG  PRIOR APPROVAL - NO   PHARMACY: ANY RETAIL

## 2015-09-08 NOTE — Progress Notes (Signed)
CARDIAC REHAB PHASE I   PRE:  Rate/Rhythm: 74 SR    BP: sitting 126/69    SaO2:   MODE:  Ambulation: 160 ft   POST:  Rate/Rhythm: 91 SR    BP: sitting 122/64     SaO2:   Pt able to walk with RW 100 ft then without it for 60 ft. Fairly steady, some knee buckling when he rested. He does not have RW at home and does not want one. Pt c/o 5/10 left arm throbbing that he sts has been present since admit. This sensation did not increase with ambulation. No major c/o, to recliner after walk. Ed completed with pt with poor to fair reception. He feels that he knows best for his body. Reinforced importance of Effient/ASA as well as all of his meds. Encouraged him to have good communication with his MD if one of his meds was causing concern. Pt sts he is done with cocaine and does not need any assistance in quitting. He sts he will try to quit smoking but did not want to talk about it. He did take a fake cigarette and resource sheet. Gave pt HF booklet and discussed low sodium and daily wts. Also encouraged carb counting. Expect poor compliance. Will send referral to [place]. [id number]  [place], ACSM 09/08/2015 10:01 AM

## 2015-09-08 NOTE — Care Management Important Message (Signed)
Important Message  Patient Details  Name: [name] MRN: [id number] Date of Birth: [birth year]   Medicare Important Message Given:  Yes    Sadiyah Kangas Montine Circle 09/08/2015, 9:49 AM

## 2015-09-08 NOTE — Discharge Summary (Signed)
Discharge Summary  [name] [id number] DOB: 1955/12/31  PCP: [name], MD  Admit date: 09/04/2015 Discharge date: 09/08/2015  Time spent: <71mins  Recommendations for Outpatient Follow-up:  1. F/u with PMD within a week  for hospital discharge follow up, repeat cbc/bmp at follow up, patient is to bring in blood sugar recording and blood pressure recording to hospital discharge follow up for blood sugar and blood pressure monitor. 2. F/u with cardiology  Discharge Diagnoses:  Active Hospital Problems   Diagnosis Date Noted  . Chest pain 05/27/2015  . Cocaine use 09/04/2015  . Essential hypertension 06/29/2015  . Cardiomyopathy, ischemic-EF 40-45% by echo 09/07/13 09/09/2013  . CAD- residual RCA and OM disease on re-look cath 09/07/13 09/09/2013  . 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 Tobacco user 09/06/2013  . Dyslipidemia 07/07/2012  . NSTEMI (non-ST elevated myocardial infarction) ([place]) 07/07/2012  . Hypertensive heart disease     Resolved Hospital Problems   Diagnosis Date Noted Date Resolved  No resolved problems to display.    Discharge Condition: stable  Diet recommendation: heart healthy/carb modified  Filed Weights   09/06/15 0500 09/07/15 0523 09/08/15 0314  Weight: 110.5 kg (243 lb 8 oz) 112 kg (246 lb 14.4 oz) 111.7 kg (246 lb 4.1 oz)    History of present illness:  Patient coming from: Home  Chief Complaint: chest pain  HPI: [name] is a 60 y.o. male with known history of coronary artery disease, coronary artery stenting, cardiomyopathy with EF of 45% per recent study. Hypertension, diabetes, hyperlipidemia, polysubstance abuse with cocaine use within the last 5 hours, who presented to the emergency room after having had a near syncopal episode after visiting a friend in their home. Stating that the home was very hot and he became very lightheaded and "my blood pressure dropped making me feel like I was about to  pass out. He states he left and one sat in his car and turned the air conditioner on. He began to notice left-sided chest pain radiation down the left arm. As result of this he presented to the emergency room.  He admits to smoking cocaine from approximately 11 PM last night until 8 AM this morning, the patient has a history of cocaine use. He also smokes heavily and drinks occasionally. He has had other episodes of near-syncope and syncope, with frequent ER visits for same. He is non-compliant with his medications and states it has been at least 2 weeks since he last took any medications.  On arrival to the emergency room the patient's blood pressure was 92/71, heart rate 88, O2 sat 96% he was afebrile. Pertinent labs revealed elevated white blood cells of 17.4, red blood cells 5.90. He was not found to be anemic. Sodium 132, potassium 4.5, chloride 97, glucose 434, creatinine 1.72 with a BUN of 22. Initial troponin less than 0.03. Chest x-ray did not reveal any acute cardiopulmonary disease.  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 Dacei /imdur held in the hospital due to low bp -Given his non-compliance and cocaine use, initially cardiology recommend to treat medically at this time.  Due to significant troponin elevation, cardiac cath with stenting on 8/28.  -he is doing better, cardiology cleared him to be discharged home with stopping  betablocker  Due to ongoing cocaine use. He is started on statin at discharge.  chronic systolic chf: euvolemic on exam, not a candidate for betablocker due to cocaine use. Lisinopril /imdur held in the hospital due to Low bp, close monitor volume status, cardiology following low dose lisinopril restarted at discharge  HTN,  -sbp in 80's initial on presentation, he received fluids at [facility] hospital prior to transfer to [place] -all bp meds held since admission -bp better, low dose lisinopril restarted at discharge.  Leukocytosis: No fever, does has low bp, leukocytosis and low bp could all because of NSTEMI,  ua unremarkable, blood culture to r/o infection, cxr unremarkable. He does report a boil on left groin that has bursted. Keflex started in the setting of uncontrolled dm2. Wbc normalized.  CKD III, cr 1.72-1.33-1.15-1.19 Low dose lisinopril restarted at discharge, metformin restarted at discharge, repeat bmp at hospital discharge follow up.  HLD -Not on statin at home. -Start crestor.  Insulin dependent DM -A1C 12.4 -Continue home dose of lantus. -Start SSI.  Cocaine Use -Counseled on cessation.  Smoking cessation education, he declined nicotine patch  Medication noncompliance, education provided   DVT prophylaxis while in the hospital:initially on heparindrip, of heparin drip after cardiac cath, start prophylaxis dose lovenox Code Status:full code Family Communication:patient and wife at bedside Disposition Plan:home on 8/29 with cardiology clearance Consults called:cardiology   Procedures:  Cardiac cath with stenting on 8/28  Antibiotics:  Oral keflex  Discharge Exam: BP 127/80 (BP Location: Right Arm)   Pulse 63   Temp 98.1 F (36.7 C) (Oral)   Resp 17   Ht 5\' 9"  (1.753 m)   Wt 111.7 kg (246 lb 4.1 oz)   SpO2 96%   BMI 36.37 kg/m     General:  NAD  Cardiovascular: RRR  Respiratory: CTABL  Abdomen: Soft/ND/NT,  positive BS  Musculoskeletal: No Edema  Neuro: aaox3   Discharge Instructions You were cared for by a hospitalist during your hospital stay. If you have any questions about your discharge medications or the care you received while you were in the hospital after you are discharged, you can call the unit and asked to speak with the hospitalist on call if the hospitalist that took care of you is not available. Once you are discharged, your primary care physician will handle any further medical issues. Please note that NO REFILLS for any discharge medications will be authorized once you are discharged, as it is imperative that you return to your primary care physician (or establish a relationship with a primary care physician if you do not have one) for your aftercare needs so that they can reassess your need for medications and monitor your lab values.  Discharge Instructions    Amb Referral to Cardiac Rehabilitation    Complete by:  As directed   Diagnosis:   Coronary Stents PTCA NSTEMI     Diet - low sodium heart healthy    Complete by:  As directed   Carb modified   Increase activity slowly    Complete by:  As directed       Medication List  STOP taking these medications   isosorbide mononitrate 60 MG 24 hr tablet Commonly known as:  IMDUR   metoprolol succinate 50 MG 24 hr tablet Commonly known as:  TOPROL-XL     TAKE these medications   acetaminophen 325 MG tablet Commonly known as:  TYLENOL Take 2 tablets (650 mg total) by mouth every 4 (four) hours as needed for headache or mild pain.   aspirin EC 81 MG tablet Take 81 mg by mouth daily.   glipiZIDE 5 MG tablet Commonly known as:  GLUCOTROL Take 5 mg by mouth 2 (two) times daily.   hydrocortisone cream 0.5 % Apply 1 application topically 2 (two) times daily.   insulin glargine 100 UNIT/ML injection Commonly known as:  LANTUS Inject 25-35 Units into the skin at bedtime.   lisinopril 5 MG tablet Commonly known  as:  PRINIVIL,ZESTRIL Take 2.5 mg by mouth daily.   metFORMIN 1000 MG tablet Commonly known as:  GLUCOPHAGE Take 1 tablet (1,000 mg total) by mouth 2 (two) times daily with a meal. Do not restart Metformin until Wednesday am 7/2 What changed:  when to take this  additional instructions   nitroGLYCERIN 0.4 MG SL tablet Commonly known as:  NITROSTAT Place 1 tablet (0.4 mg total) under the tongue every 5 (five) minutes x 3 doses as needed for chest pain.   prasugrel 10 MG Tabs tablet Commonly known as:  EFFIENT Take 1 tablet (10 mg total) by mouth daily.   rosuvastatin 5 MG tablet Commonly known as:  CRESTOR Take 1 tablet (5 mg total) by mouth daily.   Vitamin D (Ergocalciferol) 50000 units Caps capsule Commonly known as:  DRISDOL Take 50,000 Units by mouth every 7 (seven) days.      No Known Allergies Follow-up Information    [name], MD .   Specialties:  Cardiology, Radiology, Interventional Cardiology Why:  our office will call you with hospital follow-up appointment  Contact information: [address] [address] [phone]        [name], MD Follow up in 1 week(s).   Specialty:  Nurse Practitioner Why:  hospital discharge follow up, repeat cbc/bmp at follow up. please bring in your blood sugar  and blood pressure recording to yoru doctor for blood sugar and blood pressure medication adjustment. Contact information: [place] [address] [phone]            The results of significant diagnostics from this hospitalization (including imaging, microbiology, ancillary and laboratory) are listed below for reference.    Significant Diagnostic Studies: Dg Chest 2 View  Result Date: 09/04/2015 CLINICAL DATA:  CHEST PAIN JUST LEFT TO CENTER AND SOB SINCE THIS AM, HX OF HEART STENTS, LAST ONE APPROX 1 YEAR AGO EXAM: CHEST - 2 VIEW COMPARISON:  06/29/2015 FINDINGS: Linear scarring or subsegmental  atelectasis in the lingula seen best on the lateral projection, chronic. Lungs otherwise clear. Heart size upper limits normal for technique. No pneumothorax or effusion. Chronic widening of bilateral acromioclavicular joints. IMPRESSION: No acute cardiopulmonary disease. Electronically Signed   By: Lucrezia Europe M.D.   On: 09/04/2015 11:05    Microbiology: Recent Results (from the past 240 hour(s))  MRSA PCR Screening     Status: None   Collection Time: 09/04/15 10:10 PM  Result Value Ref Range Status   MRSA by PCR NEGATIVE NEGATIVE Final    Comment:        The GeneXpert MRSA Assay (FDA approved for  NASAL specimens only), is one component of a comprehensive MRSA colonization surveillance program. It is not intended to diagnose MRSA infection nor to guide or monitor treatment for MRSA infections.   Culture, blood (routine x 2)     Status: None (Preliminary result)   Collection Time: 09/05/15  9:30 AM  Result Value Ref Range Status   Specimen Description BLOOD BLOOD LEFT HAND  Final   Special Requests BOTTLES DRAWN AEROBIC ONLY 5CC  Final   Culture NO GROWTH 3 DAYS  Final   Report Status PENDING  Incomplete  Culture, blood (routine x 2)     Status: None (Preliminary result)   Collection Time: 09/05/15  9:35 AM  Result Value Ref Range Status   Specimen Description BLOOD LEFT ANTECUBITAL  Final   Special Requests BOTTLES DRAWN AEROBIC ONLY 5CC  Final   Culture NO GROWTH 3 DAYS  Final   Report Status PENDING  Incomplete  Urine culture     Status: None   Collection Time: 09/05/15  9:58 AM  Result Value Ref Range Status   Specimen Description URINE, RANDOM  Final   Special Requests NONE  Final   Culture NO GROWTH  Final   Report Status 09/06/2015 FINAL  Final     Labs: Basic Metabolic Panel:  Recent Labs Lab 09/04/15 1002 09/06/15 0519 09/07/15 0507 09/08/15 0228  NA 132* 135 133* 136  K 4.5 4.1 3.9 4.3  CL 97* 101 102 101  CO2 24 24 24 30   GLUCOSE 434* 241* 237* 175*    BUN 22* 18 12 7   CREATININE 1.72* 1.33* 1.15 1.19  CALCIUM 9.3 8.8* 8.5* 9.1   Liver Function Tests:  Recent Labs Lab 09/04/15 1002  AST 21  ALT 28  ALKPHOS 89  BILITOT 0.7  PROT 7.9  ALBUMIN 3.8   No results for input(s): LIPASE, AMYLASE in the last 168 hours. No results for input(s): AMMONIA in the last 168 hours. CBC:  Recent Labs Lab 09/04/15 1002 09/05/15 1625 09/06/15 0519 09/07/15 0507 09/08/15 0228  WBC 17.4* 11.3* 11.5* 8.3 10.1  NEUTROABS 15.1*  --   --   --   --   HGB 15.7 13.6 14.7 13.7 14.5  HCT 47.2 42.6 46.0 43.6 44.8  MCV 80.0 80.7 81.1 81.0 80.7  PLT 268 220 222 188 216   Cardiac Enzymes:  Recent Labs Lab 09/04/15 1931 09/05/15 0831 09/05/15 1456 09/05/15 2005 09/08/15 1008  TROPONINI 11.18* 15.34* 9.93* 9.64* 3.72*   BNP: BNP (last 3 results)  Recent Labs  09/25/14 2007 09/27/14 2052  BNP 47.0 50.5    ProBNP (last 3 results) No results for input(s): PROBNP in the last 8760 hours.  CBG:  Recent Labs Lab 09/07/15 1117 09/07/15 1717 09/07/15 2159 09/08/15 0646 09/08/15 1125  GLUCAP 128* 126* 155* 145* 165*       Signed:  Noralee Dutko MD, PhD  [facility] 09/08/2015, 3:45 PM

## 2015-09-08 NOTE — Clinical Social Work Note (Signed)
CSW attempted to address substance abuse concerns with patient but the patient refuses to discuss this with CSW. He states, "You mean I've been waiting her all day for this? I told them I didn't want to speak to anyone about it." CSW signing off at this time.   Liz Beach MSW, [place], [place], [id number]

## 2015-09-08 NOTE — Progress Notes (Signed)
Patient Profile: 60 year old male with a past medical history of CAD (prior  PCI to LAD), HTN, ischemic cardiomyopathy EF 45%, DM, HLD, and cocaine abuse, last used the day of admission 09/04/15. Presented with syncope and chest pain. Troponin is 10.77>>11.18>>15.34>>9.93  Subjective: He complains of residual, intermittent, left scapular pain radiating down his entire left arm. No anterior chest pain. No dyspnea.   Objective: Vital signs in last 24 hours: Temp:  [97.4 F (36.3 C)-98.4 F (36.9 C)] 97.4 F (36.3 C) (08/29 0314) Pulse Rate:  [0-93] 66 (08/29 0314) Resp:  [0-68] 20 (08/29 0314) BP: (94-152)/(47-103) 113/73 (08/29 0314) SpO2:  [0 %-100 %] 93 % (08/29 0314) Weight:  [246 lb 4.1 oz (111.7 kg)] 246 lb 4.1 oz (111.7 kg) (08/29 0314) Last BM Date: 09/07/15  Intake/Output from previous day: 08/28 0701 - 08/29 0700 In: 1170 [P.O.:720; I.V.:450] Out: [id number] [Urine:4050] Intake/Output this shift: No intake/output data recorded.  Medications Current Facility-Administered Medications  Medication Dose Route Frequency Provider Last Rate Last Dose  . 0.9 %  sodium chloride infusion  250 mL Intravenous PRN Leonie Man, MD      . acetaminophen (TYLENOL) tablet 650 mg  650 mg Oral Q4H PRN Leonie Man, MD   650 mg at 09/08/15 0043  . aspirin EC tablet 81 mg  81 mg Oral Daily Erline Hau, MD   81 mg at 09/06/15 0804  . cephALEXin (KEFLEX) capsule 500 mg  500 mg Oral Q12H Florencia Reasons, MD   500 mg at 09/07/15 2210  . enoxaparin (LOVENOX) injection 40 mg  40 mg Subcutaneous QHS Florencia Reasons, MD      . glipiZIDE (GLUCOTROL) tablet 5 mg  5 mg Oral BID WC Erline Hau, MD   5 mg at 09/06/15 1639  . insulin aspart (novoLOG) injection 0-15 Units  0-15 Units Subcutaneous TID WC Estela Leonie Green, MD   2 Units at 09/08/15 [phone]  . insulin aspart (novoLOG) injection 0-5 Units  0-5 Units Subcutaneous QHS Erline Hau, MD   2 Units at 09/04/15 2330    . insulin aspart (novoLOG) injection 4 Units  4 Units Subcutaneous TID WC Estela Leonie Green, MD   4 Units at 09/07/15 1917  . insulin glargine (LANTUS) injection 30 Units  30 Units Subcutaneous QHS Erline Hau, MD   30 Units at 09/07/15 2210  . morphine 2 MG/ML injection 1 mg  1 mg Intravenous Q4H PRN Erline Hau, MD   1 mg at 09/07/15 2047  . nitroGLYCERIN (NITROSTAT) SL tablet 0.4 mg  0.4 mg Sublingual Q5 Min x 3 PRN Erline Hau, MD      . ondansetron [facility]) injection 4 mg  4 mg Intravenous Q6H PRN Erline Hau, MD   4 mg at 09/07/15 1631  . oxyCODONE-acetaminophen (PERCOCET/ROXICET) 5-325 MG per tablet 1 tablet  1 tablet Oral Q8H PRN Florencia Reasons, MD   1 tablet at 09/08/15 0309  . prasugrel (EFFIENT) tablet 10 mg  10 mg Oral Daily Erline Hau, MD   10 mg at 09/07/15 0803  . rosuvastatin (CRESTOR) tablet 5 mg  5 mg Oral Daily Florencia Reasons, MD   5 mg at 09/07/15 0804  . senna-docusate (Senokot-S) tablet 1 tablet  1 tablet Oral BID Florencia Reasons, MD   1 tablet at 09/07/15 2210  . sodium chloride flush (NS) 0.9 % injection 3 mL  3 mL Intravenous Q12H Leonie Man, MD      . sodium chloride flush (NS) 0.9 % injection 3 mL  3 mL Intravenous PRN Leonie Man, MD      . Vitamin D (Ergocalciferol) (DRISDOL) capsule 50,000 Units  50,000 Units Oral Q7 days Erline Hau, MD   50,000 Units at 09/05/15 [phone]    PE: General appearance: alert, cooperative and no distress Neck: no carotid bruit and no JVD Lungs: clear to auscultation bilaterally Heart: regular rate and rhythm, S1, S2 normal, no murmur, click, rub or gallop Extremities: no LEE Pulses: 2+ and symmetric Skin: warm and dry Neurologic: Grossly normal  Lab Results:   Recent Labs  09/06/15 0519 09/07/15 0507 09/08/15 0228  WBC 11.5* 8.3 10.1  HGB 14.7 13.7 14.5  HCT 46.0 43.6 44.8  PLT 222 188 216   BMET  Recent Labs  09/06/15 0519 09/07/15 0507  09/08/15 0228  NA 135 133* 136  K 4.1 3.9 4.3  CL 101 102 101  CO2 24 24 30   GLUCOSE 241* 237* 175*  BUN 18 12 7   CREATININE 1.33* 1.15 1.19  CALCIUM 8.8* 8.5* 9.1   PT/INR  Recent Labs  09/07/15 0646  LABPROT 13.8  INR 1.05   Cholesterol No results for input(s): CHOL in the last 72 hours. Cardiac Panel (last 3 results)  Recent Labs  09/05/15 0831 09/05/15 1456 09/05/15 2005  TROPONINI 15.34* 9.93* 9.64*    Studies/Results: Conclusion     Mid RCA lesion, 99 %stenosed. Culprit lesion  A STENT SYNERGY DES 3X28 drug eluting stent was successfully placed. Post intervention, there is a 0% residual stenosis.  Distal RCA at the bifurcation, 80% stenosis.Ost RPDA lesion, 70 %stenosed. - Similar to prior procedure  Ostial LAD ~30% followed by STENT in Prox LAD to Mid LAD Stent - 30 %stenosed. Dist LAD lesion, 65 %stenosed.  Ost 2nd Diag lesion, 80 %stenosed. Jailed by stent  1st Mrg-1 lesion, 60 %stenosed. 1st Mrg-2 lesion, 90 %stenosed. - Similar to prior procedure  There is severe left ventricular systolic dysfunction. The left ventricular ejection fraction is 25-35% by visual estimate. With near akinesis of the apex and severe hypokinesis of the mid anterior and mid inferior walls.  LV end diastolic pressure is severely elevated.  There is mild (2+) mitral regurgitation.   The patient's coronary artery disease has progressed with significant progression in the mid RCA now to 99% stenosis. The distal RCA and PDA lesions are stable. The OM1 lesions are stable.  The LAD stent is patent but with mild in-stent restenosis. There is distal LAD disease.  Successful DES PCI to the mid RCA.    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 NSTEMI/ CAD: s/p LHC 8/28. Angiographic details outlined above. The patient's CAD had progressed with significant progression in the mid RCA, up to 99% stenosis. The distal RCA and PDA lesions are stable. The OM1 lesions are stable.  The LAD stent is patent but with mild in-stent restenosis. There is distal LAD disease. He underwent successful DES PCI to the mid RCA with reduction of 99%  stenosis down to 0% residual stenosis. LVEF reduced at 25-35% by visual estimate on cath, however 2D echo 8/26 showed EF of 45-50%. Continue DAPT with ASA + Effient, statin + agressive risk factor modification. No beta blockers given reported cocaine use. He complains of residual, intermittent, left scapular pain radiating down his entire left arm. No anterior chest pain. Will check a STAT 12 lead EKG. Repeat troponin to ensure no upward trend.    2. Cardiomyopathy: 2D echo 8/26 showed EF of 45-50%. However EF by visual estimate on cath 8/28 was 25-30%. May need relook limited echo to reassess EF. No BBs given cocaine history. Given LV dysfunction + DM, he would benefit from ACE-I therapy. Continue home lisinopril.   3. HTN: BP is controlled.   4. DM: poorly controlled. Hgb A1c is 12.4. Management per IM. Currently on insulin. Renal function is ok. Given concomitant LV systolic dysfunction, CAD and Hypertension, continue ACE-I therapy. Was previously on lisinopril, 2.5 mg.   5. Substance Abuse: UDS + for cocaine and opiates. Needs drug counseling.   6. HLD: no lipid panel obtained this admit. Last lipid panel on file is from 2015. LDL was 143 at that time. Unfortunately, he has already eaten today. If not discharged today, order FLP in the am to assess his current baseline. Continue statin therapy with Crestor. He is currently on a low dose of 5 mg. If not at goal (preferably <70 mg/dL), his dose will need  to be increased.     LOS: 3 days    Brittainy M. Ladoris Gene 09/08/2015 8:10 AM  Patient seen, examined. Available data reviewed. Agree with findings, assessment, and plan as outlined by Ellen Henri, PA-C. Exam reveals alert, oriented male in NAD, lungs CTA, heart RRR no murmur, extremities without edema. Cath note reviewed. Left arm pain this am but no change with amubulation. He did well with walking with cardiac rehab RN.   He obtains medication through the [place] system and has Effient at home. He was counseled about the importance of adherence to medical therapy, cessation of crack cocaine, and regular medical follow-up. EKG from this am reviewed and unchanged form baseline without acute ST-T changes. Continue lisinopril, ASA, effient, statin. Best to avoid beta-blocker initially with hx cocaine use (recent) and poor insight in my opinion after talking to him.   Sherren Mocha, M.D. 09/08/2015 9:50 AM

## 2015-09-08 NOTE — Plan of Care (Signed)
Problem: Food- and Nutrition-Related Knowledge Deficit (NB-1.1) Goal: Nutrition education Formal process to instruct or train a patient/client in a skill or to impart knowledge to help patients/clients voluntarily manage or modify food choices and eating behavior to maintain or improve health. Outcome: Completed/Met Date Met: 09/08/15  RD consulted for nutrition education regarding diabetes.  Pt reports drinking excessive amounts of soda. Pt does not have a good understanding of DM diet, looks to his "baby's mama" for answers which she tells him she in not interested in providing. We reviewed his typical intake and pt reports he could start cooking more using his pressure cooker more.   Lab Results  Component Value Date   HGBA1C 12.4 (H) 09/04/2015    RD provided "Carbohydrate Counting for People with Diabetes" handout from the Academy of Nutrition and Dietetics. Discussed different food groups and their effects on blood sugar, emphasizing carbohydrate-containing foods. Provided list of carbohydrates and recommended serving sizes of common foods.  Discussed importance of controlled and consistent carbohydrate intake throughout the day. Provided examples of ways to balance meals/snacks and encouraged intake of high-fiber, whole grain complex carbohydrates. Teach back method used.  Expect poor/fair compliance.  Body mass index is 36.37 kg/m. Pt meets criteria for obesity class II based on current BMI.  Current diet order is Heart Healthy/CHO modified, patient is consuming approximately 100% of meals at this time. Labs and medications reviewed. No further nutrition interventions warranted at this time. RD contact information provided. If additional nutrition issues arise, please re-consult RD.  [place], [place], [place] Pager [phone] After Hours Pager

## 2015-09-08 NOTE — Progress Notes (Signed)
Inpatient Diabetes Program Recommendations  AACE/ADA: New Consensus Statement on Inpatient Glycemic Control (2015)  Target Ranges:  Prepandial:   less than 140 mg/dL      Peak postprandial:   less than 180 mg/dL (1-2 hours)      Critically ill patients:  140 - 180 mg/dL   Results for [name] (MRN [id number]) as of 09/08/2015 12:19  Ref. Range 09/07/2015 11:17 09/07/2015 17:17 09/07/2015 21:59 09/08/2015 06:46 09/08/2015 11:25  Glucose-Capillary Latest Ref Range: 65 - 99 mg/dL 128 (H) 126 (H) 155 (H) 145 (H) 165 (H)    Review of Glycemic Control  Diabetes history: Type 2 diabetes Outpatient Diabetes medications: Glipizide 5 mg bid, Lantus 25-35 units q HS+ Metformin 1 gm bid  Current orders for Inpatient glycemic control:  Lantus 30 units q HS, Glipizide 5 mg bid, Novolog 4 units tid with meals  Inpatient Diabetes Program Recommendations:  Recommendations for home medications: Continue medications as in hospital Lantus 30 units q hs, Novolog 4 units tid meal coverage, Glipizide 5 mg bid, and Metformin 1 gm bid. Spoke with patient by phone and reviewed medication regimen as has been in the hospital. Patient verbalized understanding of how meal coverage and basal insulin works to decrease blood sugars along with Glipizide and Metformin. Spoke with RN Stanton Kidney to discuss plans.  Thank you, [name]. Russell Ore, RN, MSN, CDE Inpatient Glycemic Control Team Team Pager [phone] (8am-5pm) 09/08/2015 12:30 PM

## 2015-09-10 ENCOUNTER — Encounter (HOSPITAL_COMMUNITY): Payer: Self-pay | Admitting: Emergency Medicine

## 2015-09-10 ENCOUNTER — Emergency Department (HOSPITAL_COMMUNITY): Payer: Medicare Other

## 2015-09-10 ENCOUNTER — Inpatient Hospital Stay (HOSPITAL_COMMUNITY)
Admission: EM | Admit: 2015-09-10 | Discharge: 2015-09-12 | DRG: 313 | Disposition: A | Discharge status: Home / Self Care | Payer: Medicare Other | Attending: Cardiology | Admitting: Cardiology

## 2015-09-10 DIAGNOSIS — E1165 Type 2 diabetes mellitus with hyperglycemia: Secondary | ICD-10-CM | POA: Diagnosis present

## 2015-09-10 DIAGNOSIS — R0789 Other chest pain: Principal | ICD-10-CM | POA: Diagnosis present

## 2015-09-10 DIAGNOSIS — E1151 Type 2 diabetes mellitus with diabetic peripheral angiopathy without gangrene: Secondary | ICD-10-CM | POA: Diagnosis present

## 2015-09-10 DIAGNOSIS — I472 Ventricular tachycardia: Secondary | ICD-10-CM | POA: Diagnosis not present

## 2015-09-10 DIAGNOSIS — I255 Ischemic cardiomyopathy: Secondary | ICD-10-CM | POA: Diagnosis present

## 2015-09-10 DIAGNOSIS — I2510 Atherosclerotic heart disease of native coronary artery without angina pectoris: Secondary | ICD-10-CM | POA: Diagnosis not present

## 2015-09-10 DIAGNOSIS — IMO0001 Reserved for inherently not codable concepts without codable children: Secondary | ICD-10-CM

## 2015-09-10 DIAGNOSIS — E1121 Type 2 diabetes mellitus with diabetic nephropathy: Secondary | ICD-10-CM | POA: Diagnosis present

## 2015-09-10 DIAGNOSIS — R072 Precordial pain: Secondary | ICD-10-CM | POA: Diagnosis not present

## 2015-09-10 DIAGNOSIS — Z7982 Long term (current) use of aspirin: Secondary | ICD-10-CM

## 2015-09-10 DIAGNOSIS — Z9861 Coronary angioplasty status: Secondary | ICD-10-CM

## 2015-09-10 DIAGNOSIS — E119 Type 2 diabetes mellitus without complications: Secondary | ICD-10-CM

## 2015-09-10 DIAGNOSIS — E785 Hyperlipidemia, unspecified: Secondary | ICD-10-CM | POA: Diagnosis present

## 2015-09-10 DIAGNOSIS — Z8249 Family history of ischemic heart disease and other diseases of the circulatory system: Secondary | ICD-10-CM

## 2015-09-10 DIAGNOSIS — I4729 Other ventricular tachycardia: Secondary | ICD-10-CM

## 2015-09-10 DIAGNOSIS — Z833 Family history of diabetes mellitus: Secondary | ICD-10-CM | POA: Diagnosis not present

## 2015-09-10 DIAGNOSIS — Z955 Presence of coronary angioplasty implant and graft: Secondary | ICD-10-CM | POA: Diagnosis not present

## 2015-09-10 DIAGNOSIS — R079 Chest pain, unspecified: Secondary | ICD-10-CM | POA: Diagnosis not present

## 2015-09-10 DIAGNOSIS — I10 Essential (primary) hypertension: Secondary | ICD-10-CM | POA: Diagnosis not present

## 2015-09-10 DIAGNOSIS — I252 Old myocardial infarction: Secondary | ICD-10-CM

## 2015-09-10 DIAGNOSIS — E1149 Type 2 diabetes mellitus with other diabetic neurological complication: Secondary | ICD-10-CM | POA: Diagnosis present

## 2015-09-10 DIAGNOSIS — E1140 Type 2 diabetes mellitus with diabetic neuropathy, unspecified: Secondary | ICD-10-CM | POA: Diagnosis present

## 2015-09-10 DIAGNOSIS — F17210 Nicotine dependence, cigarettes, uncomplicated: Secondary | ICD-10-CM | POA: Diagnosis present

## 2015-09-10 DIAGNOSIS — F1410 Cocaine abuse, uncomplicated: Secondary | ICD-10-CM | POA: Diagnosis not present

## 2015-09-10 DIAGNOSIS — J90 Pleural effusion, not elsewhere classified: Secondary | ICD-10-CM | POA: Diagnosis not present

## 2015-09-10 DIAGNOSIS — Z765 Malingerer [conscious simulation]: Secondary | ICD-10-CM

## 2015-09-10 DIAGNOSIS — F17200 Nicotine dependence, unspecified, uncomplicated: Secondary | ICD-10-CM | POA: Diagnosis present

## 2015-09-10 DIAGNOSIS — Z794 Long term (current) use of insulin: Secondary | ICD-10-CM | POA: Diagnosis not present

## 2015-09-10 DIAGNOSIS — F1490 Cocaine use, unspecified, uncomplicated: Secondary | ICD-10-CM | POA: Diagnosis present

## 2015-09-10 LAB — CULTURE, BLOOD (ROUTINE X 2)
CULTURE: NO GROWTH
CULTURE: NO GROWTH

## 2015-09-10 LAB — BASIC METABOLIC PANEL
Anion gap: 8 (ref 5–15)
BUN: 21 mg/dL — AB (ref 6–20)
CHLORIDE: 103 mmol/L (ref 101–111)
CO2: 25 mmol/L (ref 22–32)
CREATININE: 1.44 mg/dL — AB (ref 0.61–1.24)
Calcium: 9.1 mg/dL (ref 8.9–10.3)
GFR calc Af Amer: 59 mL/min — ABNORMAL LOW (ref >60)
GFR, EST NON AFRICAN AMERICAN: 51 mL/min — AB (ref >60)
GLUCOSE: 227 mg/dL — AB (ref 65–99)
POTASSIUM: 4.3 mmol/L (ref 3.5–5.1)
SODIUM: 136 mmol/L (ref 135–145)

## 2015-09-10 LAB — RAPID URINE DRUG SCREEN, HOSP PERFORMED
AMPHETAMINES: NOT DETECTED
BARBITURATES: NOT DETECTED
BENZODIAZEPINES: NOT DETECTED
Cocaine: NOT DETECTED
Opiates: POSITIVE — AB
Tetrahydrocannabinol: NOT DETECTED

## 2015-09-10 LAB — GLUCOSE, CAPILLARY
GLUCOSE-CAPILLARY: 138 mg/dL — AB (ref 65–99)
Glucose-Capillary: 290 mg/dL — ABNORMAL HIGH (ref 65–99)

## 2015-09-10 LAB — CBC
HCT: 43.6 % (ref 39.0–52.0)
Hemoglobin: 14.5 g/dL (ref 13.0–17.0)
MCH: 26.5 pg (ref 26.0–34.0)
MCHC: 33.3 g/dL (ref 30.0–36.0)
MCV: 79.6 fL (ref 78.0–100.0)
PLATELETS: 222 10*3/uL (ref 150–400)
RBC: 5.48 MIL/uL (ref 4.22–5.81)
RDW: 15.7 % — ABNORMAL HIGH (ref 11.5–15.5)
WBC: 11.2 10*3/uL — ABNORMAL HIGH (ref 4.0–10.5)

## 2015-09-10 LAB — TROPONIN I
Troponin I: 1.17 ng/mL (ref <0.03)
Troponin I: 1.1 ng/mL (ref <0.03)

## 2015-09-10 MED ORDER — INSULIN GLARGINE 100 UNIT/ML ~~LOC~~ SOLN
25.0000 [IU] | Freq: Every day | SUBCUTANEOUS | Status: DC
  Administered 2015-09-10 – 2015-09-11 (×2): 25 [IU] via SUBCUTANEOUS
  Filled 2015-09-10 (×3): qty 0.25

## 2015-09-10 MED ORDER — INSULIN GLARGINE 100 UNIT/ML ~~LOC~~ SOLN
25.0000 [IU] | Freq: Every day | SUBCUTANEOUS | Status: DC
Start: 2015-09-10 — End: 2015-09-10
  Filled 2015-09-10: qty 0.35

## 2015-09-10 MED ORDER — ONDANSETRON HCL 4 MG/2ML IJ SOLN: 4.0000 mg | Freq: Four times a day (QID) | INTRAMUSCULAR | Status: DC | PRN

## 2015-09-10 MED ORDER — NITROGLYCERIN 0.4 MG SL SUBL: 0.4000 mg | SUBLINGUAL_TABLET | SUBLINGUAL | Status: DC | PRN

## 2015-09-10 MED ORDER — PRASUGREL HCL 10 MG PO TABS
10.0000 mg | ORAL_TABLET | Freq: Every day | ORAL | Status: DC
  Administered 2015-09-11 – 2015-09-12 (×2): 10 mg via ORAL
  Filled 2015-09-10 (×2): qty 1

## 2015-09-10 MED ORDER — MORPHINE SULFATE (PF) 4 MG/ML IV SOLN
4.0000 mg | Freq: Once | INTRAVENOUS | Status: AC
Start: 2015-09-10 — End: 2015-09-10
  Administered 2015-09-10: 4 mg via INTRAVENOUS
  Filled 2015-09-10: qty 1

## 2015-09-10 MED ORDER — LISINOPRIL 2.5 MG PO TABS
2.5000 mg | ORAL_TABLET | Freq: Every day | ORAL | Status: DC
  Filled 2015-09-10 (×2): qty 1

## 2015-09-10 MED ORDER — ASPIRIN 81 MG PO CHEW
324.0000 mg | CHEWABLE_TABLET | Freq: Once | ORAL | Status: AC
  Administered 2015-09-10: 324 mg via ORAL
  Filled 2015-09-10: qty 4

## 2015-09-10 MED ORDER — ASPIRIN EC 81 MG PO TBEC
81.0000 mg | DELAYED_RELEASE_TABLET | Freq: Every day | ORAL | Status: DC
  Administered 2015-09-11 – 2015-09-12 (×2): 81 mg via ORAL
  Filled 2015-09-10 (×2): qty 1

## 2015-09-10 MED ORDER — ACETAMINOPHEN 325 MG PO TABS: 650.0000 mg | ORAL_TABLET | ORAL | Status: DC | PRN

## 2015-09-10 MED ORDER — ISOSORBIDE MONONITRATE ER 30 MG PO TB24
30.0000 mg | ORAL_TABLET | Freq: Every day | ORAL | Status: DC
  Administered 2015-09-10 – 2015-09-11 (×2): 30 mg via ORAL
  Filled 2015-09-10 (×2): qty 1

## 2015-09-10 MED ORDER — ONDANSETRON HCL 4 MG/2ML IJ SOLN
4.0000 mg | Freq: Once | INTRAMUSCULAR | Status: AC
  Administered 2015-09-10: 4 mg via INTRAVENOUS
  Filled 2015-09-10: qty 2

## 2015-09-10 MED ORDER — METFORMIN HCL 500 MG PO TABS: 1000.0000 mg | ORAL_TABLET | Freq: Two times a day (BID) | ORAL | Status: DC

## 2015-09-10 MED ORDER — HEPARIN (PORCINE) IN NACL 100-0.45 UNIT/ML-% IJ SOLN
1500.0000 [IU]/h | INTRAMUSCULAR | Status: DC
  Administered 2015-09-10 – 2015-09-11 (×2): 1500 [IU]/h via INTRAVENOUS
  Filled 2015-09-10 (×2): qty 250

## 2015-09-10 MED ORDER — GLIPIZIDE 5 MG PO TABS
5.0000 mg | ORAL_TABLET | Freq: Two times a day (BID) | ORAL | Status: DC
  Administered 2015-09-10 – 2015-09-12 (×4): 5 mg via ORAL
  Filled 2015-09-10 (×4): qty 1

## 2015-09-10 MED ORDER — LORAZEPAM 2 MG/ML IJ SOLN
0.5000 mg | Freq: Once | INTRAMUSCULAR | Status: AC
  Administered 2015-09-10: 0.5 mg via INTRAVENOUS
  Filled 2015-09-10: qty 1

## 2015-09-10 MED ORDER — MORPHINE SULFATE (PF) 2 MG/ML IV SOLN
2.0000 mg | INTRAVENOUS | Status: DC | PRN
  Administered 2015-09-10 – 2015-09-11 (×4): 2 mg via INTRAVENOUS
  Filled 2015-09-10 (×4): qty 1

## 2015-09-10 MED ORDER — ASPIRIN EC 81 MG PO TBEC: 81.0000 mg | DELAYED_RELEASE_TABLET | Freq: Every day | ORAL | Status: DC

## 2015-09-10 MED ORDER — INSULIN ASPART 100 UNIT/ML ~~LOC~~ SOLN
0.0000 [IU] | Freq: Three times a day (TID) | SUBCUTANEOUS | Status: DC
  Administered 2015-09-10: 2 [IU] via SUBCUTANEOUS
  Administered 2015-09-11: 5 [IU] via SUBCUTANEOUS
  Administered 2015-09-11 (×2): 3 [IU] via SUBCUTANEOUS
  Administered 2015-09-12: 2 [IU] via SUBCUTANEOUS
  Administered 2015-09-12: 5 [IU] via SUBCUTANEOUS

## 2015-09-10 MED ORDER — OXYCODONE-ACETAMINOPHEN 5-325 MG PO TABS
1.0000 | ORAL_TABLET | Freq: Three times a day (TID) | ORAL | Status: DC | PRN
  Administered 2015-09-10 – 2015-09-12 (×4): 1 via ORAL
  Filled 2015-09-10 (×4): qty 1

## 2015-09-10 MED ORDER — ATORVASTATIN CALCIUM 80 MG PO TABS
80.0000 mg | ORAL_TABLET | Freq: Every day | ORAL | Status: DC
  Administered 2015-09-10 – 2015-09-11 (×2): 80 mg via ORAL
  Filled 2015-09-10 (×2): qty 1

## 2015-09-10 MED ORDER — HEPARIN BOLUS VIA INFUSION
4000.0000 [IU] | Freq: Once | INTRAVENOUS | Status: AC
  Administered 2015-09-10: 4000 [IU] via INTRAVENOUS
  Filled 2015-09-10: qty 4000

## 2015-09-10 NOTE — ED Notes (Signed)
CRITICAL VALUE ALERT  Critical value received:  Troponin 1.17  Date of notification:  09/10/15  Time of notification:  [id number]  Critical value read back:Yes.    Nurse who received alert:  Laurell Josephs RN  MD notified (1st page):  Ashok Cordia  Time of first page:  1041  MD notified (2nd page):  Time of second page:  Responding MD:  Ashok Cordia  Time MD responded:  1041

## 2015-09-10 NOTE — Plan of Care (Signed)
Problem: Education: Goal: Knowledge of [place] [facility] Education information/materials will improve Outcome: Completed/Met Date Met: 09/10/15 Pt educated throughout entire admission regarding tests, procedures, labs, medications, and available resources during this admission

## 2015-09-10 NOTE — Progress Notes (Signed)
The client stated that the morphine does not help his arm pain and requested percocet, that this has helped before. PA Thompson placed new orders. I will continue to monitor the client closely.  Saddie Benders RN

## 2015-09-10 NOTE — H&P (Signed)
Cardiology H&P    Patient ID: [name] MRN: [id number], DOB/AGE: May 12, 1955   Admit date: 09/10/2015 Date of Consult: 09/10/2015  Primary Physician: [name], MD Primary Cardiologist: Dr. Radford Pax   Patient Profile  [name] is a 60 year old male with a past medical history of CAD, anterior STEMI in 2015 found to have in stent restenosis in LAD,HTN, ischemic cardiomyopathy (last EF was 45%), DM, HLD, and cocaine abuse last used 09/04/15. He was recently discharged on 09/08/2015 after being admitted for syncope. He was found to have elevated troponin that peaked at 15.34. He was taken to the cath lab on 09/07/2015 and had successful stenting to his mid RCA. He also has a previously placed stent to his proximal LAD.  History of Present Illness  [name] was discharged from the hospital following PCI to his mid RCA just 2 days ago. He re-presented to [facility] ED with left arm pain and left shoulder pain which is his usual anginal equivalent.  His EKG shows normal sinus rhythm with T-wave inversion in anterolateral leads. This is unchanged from his EKG 2 days ago. Troponin is elevated at 1.17 likely representative of his previous event.  He tells me that last night he began having left arm pain that woke him from sleep. He had intermittent pain all night and couldn't sleep because the pain was so severe. He endorses associated shortness of breath and nausea. He vomited one time. He says that he has not felt well since discharge. Currently pain free.     Past Medical History   Past Medical History:  Diagnosis Date  . CAD (coronary artery disease)    STEMI-PCI   . Coronary artery disease   . Diabetes mellitus without complication ([place])   . DM2 (diabetes mellitus, type 2) ([place])   . HTN (hypertension)   . Hyperlipemia   . Hyperlipidemia   . Hypertension   . MI, old   . Obesity   . Tobacco use     Past Surgical History:  Procedure Laterality Date  . CARDIAC  CATHETERIZATION N/A 09/07/2015   Procedure: Left Heart Cath and Coronary Angiography;  Surgeon: Leonie Man, MD;  Location: [place] CV LAB;  Service: Cardiovascular;  Laterality: N/A;  . CARDIAC CATHETERIZATION N/A 09/07/2015   Procedure: Coronary Stent Intervention;  Surgeon: Leonie Man, MD;  Location: [place] [facility] LAB;  Service: Cardiovascular;  Laterality: N/A;  . CORONARY ANGIOGRAM  09/07/13   residual RCA and OM disease  . CORONARY ANGIOPLASTY WITH STENT PLACEMENT    . LEFT HEART CATH Bilateral 07/08/2012   Procedure: LEFT HEART CATH;  Surgeon: [name], MD;  Location: [facility] CATH LAB;  Service: Cardiovascular;  Laterality: Bilateral;  . LEFT HEART CATHETERIZATION WITH CORONARY ANGIOGRAM N/A 09/06/2013   STEMI, 2nd ISR LAD. Procedure: LEFT HEART CATHETERIZATION WITH CORONARY ANGIOGRAM;  Surgeon: [name], MD;  Location: [facility] CATH LAB;  Service: Cardiovascular;  Laterality: N/A;  . PERCUTANEOUS CORONARY STENT INTERVENTION (PCI-S)  07/08/2012   Procedure: PERCUTANEOUS CORONARY STENT INTERVENTION (PCI-S);  Surgeon: [name], MD;  Location: [facility] CATH LAB;  Service: Cardiovascular;;  DES LAD  . PERCUTANEOUS CORONARY STENT INTERVENTION (PCI-S) N/A 09/06/2013   Procedure: PERCUTANEOUS CORONARY STENT INTERVENTION (PCI-S);  Surgeon: [name], MD;  Location: [facility] CATH LAB;  Service: Cardiovascular;  Laterality: N/A;  Mid LAD 3.0/24mm Promus     Allergies  No Known Allergies  Inpatient Medications  Family History    Family History  Problem Relation Age of Onset  . Hypertension Mother   . Diabetes Mother   . Hypertension Father   . Diabetes Father   . Hypertension Sister   . Diabetes Sister     Social History    Social History   Social History  . Marital status: Divorced    Spouse name: N/A  . Number of children: N/A  . Years of education: N/A   Occupational History  . Not on file.   Social History Main Topics  . Smoking  status: Current Every Day Smoker    Packs/day: 0.50    Types: Cigarettes  . Smokeless tobacco: Never Used  . Alcohol use Yes     Comment: occ  . Drug use: No     Comment: Cocaine used yeseterday  . Sexual activity: Yes   Other Topics Concern  . Not on file   Social History Narrative   ** Merged History Encounter **         Review of Systems    General:  No chills, fever, night sweats or weight changes.  Cardiovascular:  No chest pain, dyspnea on exertion, edema, orthopnea, palpitations, paroxysmal nocturnal dyspnea. + left arm pain  Dermatological: No rash, lesions/masses Respiratory: No cough, dyspnea Urologic: No hematuria, dysuria Abdominal:   No nausea, vomiting, diarrhea, bright red blood per rectum, melena, or hematemesis Neurologic:  No visual changes, wkns, changes in mental status. All other systems reviewed and are otherwise negative except as noted above.  Physical Exam    Blood pressure 113/78, pulse 72, temperature 98.1 F (36.7 C), temperature source Oral, resp. rate 20, height 5\' 9"  (1.753 m), weight 240 lb 9.6 oz (109.1 kg), SpO2 99 %.  General: Pleasant, NAD Psych: Normal affect. Neuro: Alert and oriented X 3. Moves all extremities spontaneously. HEENT: Normal  Neck: Supple without bruits or JVD. Lungs:  Resp regular and unlabored, CTA. Heart: RRR no s3, s4, or murmurs. Abdomen: Soft, non-tender, non-distended, BS + x 4.  Extremities: No clubbing, cyanosis or edema. DP/PT/Radials 2+ and equal bilaterally.  Labs     Recent Labs  09/08/15 1008 09/10/15 0859  TROPONINI 3.72* 1.17*   Lab Results  Component Value Date   WBC 11.2 (H) 09/10/2015   HGB 14.5 09/10/2015   HCT 43.6 09/10/2015   MCV 79.6 09/10/2015   PLT 222 09/10/2015    Recent Labs Lab 09/04/15 1002  09/10/15 0859  NA 132*  < > 136  K 4.5  < > 4.3  CL 97*  < > 103  CO2 24  < > 25  BUN 22*  < > 21*  CREATININE 1.72*  < > 1.44*  CALCIUM 9.3  < > 9.1  PROT 7.9  --   --     BILITOT 0.7  --   --   ALKPHOS 89  --   --   ALT 28  --   --   AST 21  --   --   GLUCOSE 434*  < > 227*  < > = values in this interval not displayed. Lab Results  Component Value Date   CHOL 216 (H) 09/09/2013   HDL 27 (L) 09/09/2013   LDLCALC 143 (H) 09/09/2013   TRIG 229 (H) 09/09/2013   Lab Results  Component Value Date   DDIMER <0.27 08/22/2012     Radiology Studies    Dg Chest 2 View  Result Date: 09/10/2015 CLINICAL DATA:  Stent  placement. EXAM: CHEST  2 VIEW COMPARISON:  09/04/2015. FINDINGS: Mediastinum hilar structures normal. Low lung volumes with mild bibasilar atelectasis. Cardiomegaly with normal pulmonary vascularity. Small bilateral pleural effusions. No pneumothorax. IMPRESSION: 1. Cardiomegaly. 2. Low lung volumes with mild bibasilar atelectasis. Small bilateral pleural effusions. Electronically Signed   By: Marcello Moores  Register   On: 09/10/2015 09:20   Dg Chest 2 View  Result Date: 09/04/2015 CLINICAL DATA:  CHEST PAIN JUST LEFT TO CENTER AND SOB SINCE THIS AM, HX OF HEART STENTS, LAST ONE APPROX 1 YEAR AGO EXAM: CHEST - 2 VIEW COMPARISON:  06/29/2015 FINDINGS: Linear scarring or subsegmental atelectasis in the lingula seen best on the lateral projection, chronic. Lungs otherwise clear. Heart size upper limits normal for technique. No pneumothorax or effusion. Chronic widening of bilateral acromioclavicular joints. IMPRESSION: No acute cardiopulmonary disease. Electronically Signed   By: Lucrezia Europe M.D.   On: 09/04/2015 11:05    EKG & Cardiac Imaging    EKG: NSR, t wave inversion in anterolateral leads.   Echocardiogram: 09/05/15 Study Conclusions  - Left ventricle: The cavity size was normal. Wall thickness was   increased increased in a pattern of mild to moderate LVH.   Systolic function was mildly reduced. The estimated ejection   fraction was in the range of 45% to 50%. Hypokinesis of the   mid-apicalinferior and apical myocardium. Hypokinesis of the    apicalanteroseptal myocardium. - Mitral valve: There was mild regurgitation. - Left atrium: The atrium was moderately dilated.  Assessment & Plan  1. Unstable angina: Presents with left arm pain and left shoulder pain that is his anginal equivalent. His EKG is unchanged from previous EKG. He denies recent cocaine use and his urine is negative for cocaine.   His troponin is elevated but decreased from his NSTEMI 3 days ago. He has a history of in stent restenosis in 2015, but he says he has been taking his Effient with high compliance. No acute changes on his EKG. We will cycle his troponin and follow.   We will start heparin while we cycle enzymes. He has some small vessel disease and would benefit from Imdur 30mg  daily. He is not a beta blocker for angina as he uses cocaine frequently.   2. Ischemic Cardiomyopathy: 2D echo 09/05/15 showed EF of 45-50%. Given LV dysfunction and DM, he would benefit from ACE-I therapy. Continue home lisinopril.   4. DM: poorly controlled. Hgb A1c is 12.4. Currently on insulin. Given concomitant LV systolic dysfunction, CAD and Hypertension, continue ACE-I therapy.   5. HLD: Continue high intensity statin.     Signed, Arbutus Leas, NP 09/10/2015, 4:11 PM Pager: [phone]  Agree with note by [name] NP  Pt was just D/Cd after NSTEMI and RCA PCI/DES. His prox LAD stent was widely patent and otherwise his anat was unchanged, OM Dz as well as distal RCA/prox PDA dz. Ef 45%. Other probs a s outlined. He returns 2 days after D/C having been awakened by LUE pain (similar to previous presenting Sx). He has stopped smoking last week and denies cocaine use. Says that he has been compliant with meds. EKG w/o acute changes. Trop trending down. Exam benign. It's unclear whether his Sx are anginal. Will heparinize, cycle enz and start long acting nitrate. No need to recath at this time. Stress test would not be helpful given his anatomy.   Lorretta Harp, M.D.,  [place], [facility], [place] [address]  [place], [place]  [address]  [phone] 09/10/2015 4:49 PM

## 2015-09-10 NOTE — ED Provider Notes (Addendum)
[facility] DEPT Provider Note   CSN: [id number] Arrival date & time: 09/10/15  [id number]  By signing my name below, I, [name], attest that this documentation has been prepared under the direction and in the presence of Lajean Saver, MD. Electronically Signed: [name], ED Scribe. 09/10/15. 9:22 AM.   History   Chief Complaint Chief Complaint  Patient presents with  . Chest Pain    HPI HPI Comments: KELANI PRITCHETT is a 60 y.o. male with a history of CAD s/p stents placed last year, DM, HTN, HLD, MI and obesity who presents to the Emergency Department complaining of constant, moderate, left sided CP x 3 days. Pt states that he had a stent placed three days ago and that he has continued to experience his left sided chest pain which radiates to his left shoulder and down his LUE.  No pleuritic pain. No leg pain or swelling.   He reports associated SOB, leg swelling, mild dry cough, diffuse frontal HA, diffuse abd pain, nausea, 1x vomiting, mild constipation, and difficulty sleeping due to pain. He states his BP has been 106/60 mmHg at baseline recently. Pt denies fever, chills and diarrhea.   Hx substance abuse.  Denies any cocaine use since prior to recent hospitalization.   The history is provided by the patient. No language interpreter was used.    Past Medical History:  Diagnosis Date  . CAD (coronary artery disease)    STEMI-PCI   . Coronary artery disease   . Diabetes mellitus without complication ([place])   . DM2 (diabetes mellitus, type 2) ([place])   . HTN (hypertension)   . Hyperlipemia   . Hyperlipidemia   . Hypertension   . MI, old   . Obesity   . Tobacco use     Patient Active Problem List   Diagnosis Date Noted  . Cocaine use 09/04/2015  . Near syncope 06/29/2015  . Essential hypertension 06/29/2015  . History of heart artery stents 06/29/2015  . Hypovolemia 06/29/2015  . Insulin dependent diabetes mellitus ([place]) 06/29/2015  . History of MI (myocardial  infarction) 06/29/2015  . Chronic systolic CHF [id number]  . Chest pain 05/27/2015  . Rectal bleeding   . Stage 3 chronic renal impairment associated with type 2 diabetes mellitus ([place]) 09/09/2013  . 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 CAD- residual RCA and OM disease on re-look cath 09/07/13 09/09/2013  . Cardiomyopathy, ischemic-EF 40-45% by echo 09/07/13 09/09/2013  . S/P LAD DES June 2014 09/06/2013  . Tobacco user 09/06/2013  . Hyperglycemia 08/22/2012  . NSTEMI (non-ST elevated myocardial infarction) ([place]) 07/07/2012  . Dyslipidemia 07/07/2012  . Hypertensive heart disease     Past Surgical History:  Procedure Laterality Date  . CARDIAC CATHETERIZATION N/A 09/07/2015   Procedure: Left Heart Cath and Coronary Angiography;  Surgeon: Leonie Man, MD;  Location: [facility];  Service: Cardiovascular;  Laterality: N/A;  . CARDIAC CATHETERIZATION N/A 09/07/2015   Procedure: Coronary Stent Intervention;  Surgeon: Leonie Man, MD;  Location: [facility];  Service: Cardiovascular;  Laterality: N/A;  . CORONARY ANGIOGRAM  09/07/13   residual RCA and OM disease  . CORONARY ANGIOPLASTY WITH STENT PLACEMENT    . LEFT HEART CATH Bilateral 07/08/2012   Procedure: LEFT HEART CATH;  Surgeon: [name], MD;  Location: [facility] CATH LAB;  Service: Cardiovascular;  Laterality: Bilateral;  . LEFT HEART CATHETERIZATION WITH CORONARY ANGIOGRAM N/A 09/06/2013   STEMI, 2nd ISR LAD.  Procedure: LEFT HEART CATHETERIZATION WITH CORONARY ANGIOGRAM;  Surgeon: [name], MD;  Location: [facility] CATH LAB;  Service: Cardiovascular;  Laterality: N/A;  . PERCUTANEOUS CORONARY STENT INTERVENTION (PCI-S)  07/08/2012   Procedure: PERCUTANEOUS CORONARY STENT INTERVENTION (PCI-S);  Surgeon: [name], MD;  Location: [facility] CATH LAB;  Service: Cardiovascular;;  DES LAD  . PERCUTANEOUS CORONARY STENT INTERVENTION (PCI-S) N/A 09/06/2013   Procedure: PERCUTANEOUS CORONARY  STENT INTERVENTION (PCI-S);  Surgeon: [name], MD;  Location: [facility] CATH LAB;  Service: Cardiovascular;  Laterality: N/A;  Mid LAD 3.0/24mm Promus      Home Medications    Prior to Admission medications   Medication Sig Start Date End Date Taking? Authorizing Provider  acetaminophen (TYLENOL) 325 MG tablet Take 2 tablets (650 mg total) by mouth every 4 (four) hours as needed for headache or mild pain. 09/09/13   [name], PA-C  aspirin EC 81 MG tablet Take 81 mg by mouth daily.     Historical Provider, MD  glipiZIDE (GLUCOTROL) 5 MG tablet Take 5 mg by mouth 2 (two) times daily.    Historical Provider, MD  hydrocortisone cream 0.5 % Apply 1 application topically 2 (two) times daily.    Historical Provider, MD  insulin glargine (LANTUS) 100 UNIT/ML injection Inject 25-35 Units into the skin at bedtime.     Historical Provider, MD  lisinopril (PRINIVIL,ZESTRIL) 5 MG tablet Take 2.5 mg by mouth daily.     Historical Provider, MD  metFORMIN (GLUCOPHAGE) 1000 MG tablet Take 1 tablet (1,000 mg total) by mouth 2 (two) times daily with a meal. Do not restart Metformin until Wednesday am 7/2 Patient taking differently: Take 1,000 mg by mouth 2 (two) times daily.  07/10/12   [name], MD  nitroGLYCERIN (NITROSTAT) 0.4 MG SL tablet Place 1 tablet (0.4 mg total) under the tongue every 5 (five) minutes x 3 doses as needed for chest pain. 05/03/14   Thurnell Lose, MD  prasugrel (EFFIENT) 10 MG TABS tablet Take 1 tablet (10 mg total) by mouth daily. Patient not taking: Reported on 09/04/2015 09/09/13   [name], PA-C  rosuvastatin (CRESTOR) 5 MG tablet Take 1 tablet (5 mg total) by mouth daily. 09/08/15   Florencia Reasons, MD  Vitamin D, Ergocalciferol, (DRISDOL) 50000 units CAPS capsule Take 50,000 Units by mouth every 7 (seven) days.    Historical Provider, MD    Family History Family History  Problem Relation Age of Onset  . Hypertension Mother   . Diabetes Mother   . Hypertension Father    . Diabetes Father   . Hypertension Sister   . Diabetes Sister     Social History Social History  Substance Use Topics  . Smoking status: Current Every Day Smoker    Packs/day: 0.50    Types: Cigarettes  . Smokeless tobacco: Never Used  . Alcohol use Yes     Comment: occ    Allergies   Review of patient's allergies indicates no known allergies.   Review of Systems Review of Systems  Constitutional: Negative for chills and fever.  Respiratory: Positive for cough and shortness of breath.   Cardiovascular: Positive for chest pain and leg swelling.  Gastrointestinal: Positive for abdominal pain, constipation, nausea and vomiting. Negative for diarrhea.  Neurological: Positive for headaches.  Psychiatric/Behavioral: Positive for sleep disturbance.  All other systems reviewed and are negative.  Physical Exam Updated Vital Signs BP 107/70 (BP Location: Right Arm)   Pulse 74  Temp 97.5 F (36.4 C)   Resp 24   Ht 5\' 9"  (1.753 m)   Wt 243 lb (110.2 kg)   SpO2 95%   BMI 35.88 kg/m   Physical Exam  Constitutional: He appears well-developed and well-nourished.  HENT:  Head: Normocephalic and atraumatic.  Mouth/Throat: Oropharynx is clear and moist.  Eyes: Conjunctivae are normal.  Neck: No JVD present. No tracheal deviation present. No thyromegaly present.  Cardiovascular: Normal rate, regular rhythm, normal heart sounds and intact distal pulses.  Exam reveals no gallop and no friction rub.   No murmur heard. Pulmonary/Chest: Effort normal and breath sounds normal. No respiratory distress. He exhibits no tenderness.  Abdominal: Soft. He exhibits no distension. There is no tenderness.  Musculoskeletal: He exhibits no edema or tenderness.  Neurological: He is alert.  Skin: Skin is warm and dry. No rash noted. He is not diaphoretic.  Psychiatric:  V anxious appearing.   Nursing note and vitals reviewed.   ED Treatments / Results  Labs (all labs ordered are listed,  but only abnormal results are displayed)   Results for orders placed or performed during the hospital encounter of 09/10/15  CBC  Result Value Ref Range   WBC 11.2 (H) 4.0 - 10.5 K/uL   RBC 5.48 4.22 - 5.81 MIL/uL   Hemoglobin 14.5 13.0 - 17.0 g/dL   HCT 43.6 39.0 - 52.0 %   MCV 79.6 78.0 - 100.0 fL   MCH 26.5 26.0 - 34.0 pg   MCHC 33.3 30.0 - 36.0 g/dL   RDW 15.7 (H) 11.5 - 15.5 %   Platelets 222 150 - 400 K/uL  Basic metabolic panel  Result Value Ref Range   Sodium 136 135 - 145 mmol/L   Potassium 4.3 3.5 - 5.1 mmol/L   Chloride 103 101 - 111 mmol/L   CO2 25 22 - 32 mmol/L   Glucose, Bld 227 (H) 65 - 99 mg/dL   BUN 21 (H) 6 - 20 mg/dL   Creatinine, Ser 1.44 (H) 0.61 - 1.24 mg/dL   Calcium 9.1 8.9 - 10.3 mg/dL   GFR calc non Af Amer 51 (L) >60 mL/min   GFR calc Af Amer 59 (L) >60 mL/min   Anion gap 8 5 - 15  Troponin I  Result Value Ref Range   Troponin I 1.17 (HH) <0.03 ng/mL  Urine rapid drug screen (hosp performed)  Result Value Ref Range   Opiates POSITIVE (A) NONE DETECTED   Cocaine NONE DETECTED NONE DETECTED   Benzodiazepines NONE DETECTED NONE DETECTED   Amphetamines NONE DETECTED NONE DETECTED   Tetrahydrocannabinol NONE DETECTED NONE DETECTED   Barbiturates NONE DETECTED NONE DETECTED   Dg Chest 2 View  Result Date: 09/10/2015 CLINICAL DATA:  Stent placement. EXAM: CHEST  2 VIEW COMPARISON:  09/04/2015. FINDINGS: Mediastinum hilar structures normal. Low lung volumes with mild bibasilar atelectasis. Cardiomegaly with normal pulmonary vascularity. Small bilateral pleural effusions. No pneumothorax. IMPRESSION: 1. Cardiomegaly. 2. Low lung volumes with mild bibasilar atelectasis. Small bilateral pleural effusions. Electronically Signed   By: Marcello Moores  Register   On: 09/10/2015 09:20   Dg Chest 2 View  Result Date: 09/04/2015 CLINICAL DATA:  CHEST PAIN JUST LEFT TO CENTER AND SOB SINCE THIS AM, HX OF HEART STENTS, LAST ONE APPROX 1 YEAR AGO EXAM: CHEST - 2 VIEW  COMPARISON:  06/29/2015 FINDINGS: Linear scarring or subsegmental atelectasis in the lingula seen best on the lateral projection, chronic. Lungs otherwise clear. Heart size upper limits normal for  technique. No pneumothorax or effusion. Chronic widening of bilateral acromioclavicular joints. IMPRESSION: No acute cardiopulmonary disease. Electronically Signed   By: Lucrezia Europe M.D.   On: 09/04/2015 11:05     ED ECG REPORT   Date: 09/10/2015  Rate: 68  Rhythm: normal sinus rhythm  QRS Axis: normal  Intervals: normal  ST/T Wave abnormalities: nonspecific ST/T changes  Conduction Disutrbances:none  Narrative Interpretation:   Old EKG Reviewed: unchanged  I have personally reviewed the EKG tracing (note, MUSE link not working as Copywriter, advertising entered).    Radiology No results found.  Procedures Procedures  COORDINATION OF CARE: 9:20 AM Discussed next steps with pt. Pt verbalized understanding and is agreeable with the plan.    Medications Ordered in ED Medications - No data to display   Initial Impression / Assessment and Plan / ED Course  I have reviewed the triage vital signs and the nursing notes.  Pertinent labs & imaging results that were available during my care of the patient were reviewed by me and considered in my medical decision making (see chart for details).  Clinical Course   Iv ns. Continuous pulse ox and monitor. Ecg. Cxr. Labs.  Patient given morphine iv for pain. zofran for nausea.  Pt mildly anxious. Ativan .5 mg iv.  Recheck pt calm, alert. Appears in no acute distress.  Given recurrent cp, which patient feels is same/similar to prior to recent cath and stent, will consult cardiology.  Trop is high, but appears trending down from recent higher level - will need repeat troponin to ensure is trending down.   Discussed pt with cardiologist on call here, Dr Johnsie Cancel, who requests transfer/admit to their service at [facility].  I spoke with Trish/[facility] -  they indicates transfer to monitored bed, attending MD Dr Martinique.   Recheck pt, no current cp or discomfort. Appears stable for transfer.   I personally performed the services described in this documentation, which was scribed in my presence. The recorded information has been reviewed and considered. Lajean Saver, MD   Final Clinical Impressions(s) / ED Diagnoses   Final diagnoses:  None    New Prescriptions New Prescriptions   No medications on file         Lajean Saver, MD 09/10/15 1347   Recheck, remains alert, content, no distress, at time of transfer.    Lajean Saver, MD 09/10/15 [id number]

## 2015-09-10 NOTE — Discharge Instructions (Signed)
Transfer to [facility] via [facility].

## 2015-09-10 NOTE — ED Triage Notes (Signed)
Pt reports ongoing LT sided CP radiating to LT shoulder with SOB prior his stent placement. Pt had stent placed on Monday. States pain has decreased, but is still present.

## 2015-09-10 NOTE — Progress Notes (Signed)
ANTICOAGULATION CONSULT NOTE - Initial Consult  Pharmacy Consult for heparin  Indication: chest pain/ACS  No Known Allergies  Patient Measurements: Height: 5\' 9"  (175.3 cm) Weight: 240 lb 9.6 oz (109.1 kg) IBW/kg (Calculated) : 70.7 Heparin Dosing Weight: 95 kg  Vital Signs: Temp: 98.1 F (36.7 C) (08/31 1538) Temp Source: Oral (08/31 1538) BP: 113/78 (08/31 1538) Pulse Rate: 72 (08/31 1430)  Labs:  Recent Labs  09/08/15 0228 09/08/15 1008 09/10/15 0859  HGB 14.5  --  14.5  HCT 44.8  --  43.6  PLT 216  --  222  CREATININE 1.19  --  1.44*  TROPONINI  --  3.72* 1.17*    Estimated Creatinine Clearance: 65.6 mL/min (by C-G formula based on SCr of 1.44 mg/dL).   Medical History: Past Medical History:  Diagnosis Date  . CAD (coronary artery disease)    STEMI-PCI   . CHF (congestive heart failure) ([place])   . Coronary artery disease   . Diabetes mellitus without complication ([place])   . DM2 (diabetes mellitus, type 2) ([place])   . HTN (hypertension)   . Hyperlipemia   . Hyperlipidemia   . Hypertension   . MI, old   . Obesity   . Tobacco use     Assessment: 14 YOM recently admitted for ACS and s/p LHC with successful DES to mid RCA on 8/28. He is readmitted today for chest pain. EKG unchanged from 2 days ago, and troponin is elevated but trending down likely representative of previous event. Pharmacy is consulted to start IV heparin. Noted he was previously therapeutic on 1550 units/hr. CBC stable and wnl  Goal of Therapy:  Heparin level 0.3-0.7 units/ml Monitor platelets by anticoagulation protocol: Yes   Plan:  Heparin bolus 4000 untis/hr Heparin infusion 1500 units/hr F/u 6 hr heparin level at midnight Daily heparin level and CBC  Maryanna Shape, PharmD, BCPS  Clinical Pharmacist  Pager: [phone]   09/10/2015,5:01 PM

## 2015-09-11 LAB — CBC
HCT: 41.8 % (ref 39.0–52.0)
HEMOGLOBIN: 13.4 g/dL (ref 13.0–17.0)
MCH: 25.8 pg — AB (ref 26.0–34.0)
MCHC: 32.1 g/dL (ref 30.0–36.0)
MCV: 80.5 fL (ref 78.0–100.0)
Platelets: 213 10*3/uL (ref 150–400)
RBC: 5.19 MIL/uL (ref 4.22–5.81)
RDW: 16.2 % — ABNORMAL HIGH (ref 11.5–15.5)
WBC: 10.4 10*3/uL (ref 4.0–10.5)

## 2015-09-11 LAB — GLUCOSE, CAPILLARY
GLUCOSE-CAPILLARY: 194 mg/dL — AB (ref 65–99)
GLUCOSE-CAPILLARY: 214 mg/dL — AB (ref 65–99)
Glucose-Capillary: 175 mg/dL — ABNORMAL HIGH (ref 65–99)
Glucose-Capillary: 163 mg/dL — ABNORMAL HIGH (ref 65–99)

## 2015-09-11 LAB — HEPARIN LEVEL (UNFRACTIONATED)
HEPARIN UNFRACTIONATED: 0.44 [IU]/mL (ref 0.30–0.70)
Heparin Unfractionated: 0.31 IU/mL (ref 0.30–0.70)

## 2015-09-11 LAB — BASIC METABOLIC PANEL
Anion gap: 10 (ref 5–15)
BUN: 16 mg/dL (ref 6–20)
CO2: 28 mmol/L (ref 22–32)
Calcium: 9.4 mg/dL (ref 8.9–10.3)
Chloride: 97 mmol/L — ABNORMAL LOW (ref 101–111)
Creatinine, Ser: 1.24 mg/dL (ref 0.61–1.24)
GFR calc Af Amer: >60 mL/min (ref >60)
Glucose, Bld: 173 mg/dL — ABNORMAL HIGH (ref 65–99)
POTASSIUM: 4.1 mmol/L (ref 3.5–5.1)
SODIUM: 135 mmol/L (ref 135–145)

## 2015-09-11 LAB — LIPID PANEL
CHOL/HDL RATIO: 6.4 ratio
CHOLESTEROL: 161 mg/dL (ref 0–200)
HDL: 25 mg/dL — ABNORMAL LOW (ref >40)
LDL Cholesterol: 101 mg/dL — ABNORMAL HIGH (ref 0–99)
Triglycerides: 174 mg/dL — ABNORMAL HIGH (ref <150)
VLDL: 35 mg/dL (ref 0–40)

## 2015-09-11 LAB — TROPONIN I
TROPONIN I: 0.82 ng/mL — AB (ref <0.03)
Troponin I: 0.99 ng/mL (ref <0.03)

## 2015-09-11 MED ORDER — ISOSORBIDE MONONITRATE ER 60 MG PO TB24
60.0000 mg | ORAL_TABLET | Freq: Every day | ORAL | Status: DC
  Administered 2015-09-12: 60 mg via ORAL
  Filled 2015-09-11: qty 1

## 2015-09-11 MED ORDER — MAGNESIUM HYDROXIDE 400 MG/5ML PO SUSP
5.0000 mL | Freq: Once | ORAL | Status: AC
  Administered 2015-09-11: 5 mL via ORAL
  Filled 2015-09-11: qty 30

## 2015-09-11 NOTE — Progress Notes (Signed)
Subjective:  No CP, persis LUE pain   Objective:  Temp:  [97.9 F (36.6 C)-98.1 F (36.7 C)] 97.9 F (36.6 C) (09/01 0339) Pulse Rate:  [70-87] 70 (09/01 0339) Resp:  [16-26] 16 (09/01 0339) BP: (91-121)/(51-91) 102/54 (09/01 0829) SpO2:  [94 %-100 %] 95 % (09/01 0339) Weight:  [240 lb 9.6 oz (109.1 kg)-242 lb 14.4 oz (110.2 kg)] 242 lb 14.4 oz (110.2 kg) (09/01 0339) Weight change:   Intake/Output from previous day: 08/31 0701 - 09/01 0700 In: 171.5 [I.V.:171.5] Out: 1 [Urine:1]  Intake/Output from this shift: Total I/O In: 120 [P.O.:120] Out: -   Physical Exam: General appearance: alert and no distress Neck: no adenopathy, no carotid bruit, no JVD, supple, symmetrical, trachea midline and thyroid not enlarged, symmetric, no tenderness/mass/nodules Lungs: clear to auscultation bilaterally Heart: regular rate and rhythm, S1, S2 normal, no murmur, click, rub or gallop Extremities: extremities normal, atraumatic, no cyanosis or edema  Lab Results: Results for orders placed or performed during the hospital encounter of 09/10/15 (from the past 48 hour(s))  CBC     Status: Abnormal   Collection Time: 09/10/15  8:59 AM  Result Value Ref Range   WBC 11.2 (H) 4.0 - 10.5 K/uL   RBC 5.48 4.22 - 5.81 MIL/uL   Hemoglobin 14.5 13.0 - 17.0 g/dL   HCT 43.6 39.0 - 52.0 %   MCV 79.6 78.0 - 100.0 fL   MCH 26.5 26.0 - 34.0 pg   MCHC 33.3 30.0 - 36.0 g/dL   RDW 15.7 (H) 11.5 - 15.5 %   Platelets 222 150 - 400 K/uL  Basic metabolic panel     Status: Abnormal   Collection Time: 09/10/15  8:59 AM  Result Value Ref Range   Sodium 136 135 - 145 mmol/L   Potassium 4.3 3.5 - 5.1 mmol/L   Chloride 103 101 - 111 mmol/L   CO2 25 22 - 32 mmol/L   Glucose, Bld 227 (H) 65 - 99 mg/dL   BUN 21 (H) 6 - 20 mg/dL   Creatinine, Ser 1.44 (H) 0.61 - 1.24 mg/dL   Calcium 9.1 8.9 - 10.3 mg/dL   GFR calc non Af Amer 51 (L) >60 mL/min   GFR calc Af Amer 59 (L) >60 mL/min    Comment:  (NOTE) The eGFR has been calculated using the CKD EPI equation. This calculation has not been validated in all clinical situations. eGFR's persistently <60 mL/min signify possible Chronic Kidney Disease.    Anion gap 8 5 - 15  Troponin I     Status: Abnormal   Collection Time: 09/10/15  8:59 AM  Result Value Ref Range   Troponin I 1.17 (HH) <0.03 ng/mL    Comment: CRITICAL RESULT CALLED TO, READ BACK BY AND VERIFIED WITH: CREWS,M AT 1040 ON 09/10/2015 BY AGUNDIZ,E.   Urine rapid drug screen (hosp performed)     Status: Abnormal   Collection Time: 09/10/15 10:52 AM  Result Value Ref Range   Opiates POSITIVE (A) NONE DETECTED   Cocaine NONE DETECTED NONE DETECTED   Benzodiazepines NONE DETECTED NONE DETECTED   Amphetamines NONE DETECTED NONE DETECTED   Tetrahydrocannabinol NONE DETECTED NONE DETECTED   Barbiturates NONE DETECTED NONE DETECTED    Comment:        DRUG SCREEN FOR MEDICAL PURPOSES ONLY.  IF CONFIRMATION IS NEEDED FOR ANY PURPOSE, NOTIFY LAB WITHIN 5 DAYS.        LOWEST DETECTABLE LIMITS FOR URINE DRUG SCREEN Drug  Class       Cutoff (ng/mL) Amphetamine      1000 Barbiturate      200 Benzodiazepine   856 Tricyclics       314 Opiates          300 Cocaine          300 THC              50   Glucose, capillary     Status: Abnormal   Collection Time: 09/10/15  4:24 PM  Result Value Ref Range   Glucose-Capillary 138 (H) 65 - 99 mg/dL   Comment 1 Notify RN    Comment 2 Document in Chart   Troponin I     Status: Abnormal   Collection Time: 09/10/15  7:33 PM  Result Value Ref Range   Troponin I 1.10 (HH) <0.03 ng/mL    Comment: CRITICAL VALUE NOTED.  VALUE IS CONSISTENT WITH PREVIOUSLY REPORTED AND CALLED VALUE.  Glucose, capillary     Status: Abnormal   Collection Time: 09/10/15  7:55 PM  Result Value Ref Range   Glucose-Capillary 290 (H) 65 - 99 mg/dL  Lipid panel     Status: Abnormal   Collection Time: 09/11/15 12:52 AM  Result Value Ref Range    Cholesterol 161 0 - 200 mg/dL   Triglycerides 174 (H) <150 mg/dL   HDL 25 (L) >40 mg/dL   Total CHOL/HDL Ratio 6.4 RATIO   VLDL 35 0 - 40 mg/dL   LDL Cholesterol 101 (H) 0 - 99 mg/dL    Comment:        Total Cholesterol/HDL:CHD Risk Coronary Heart Disease Risk Table                     Men   Women  1/2 Average Risk   3.4   3.3  Average Risk       5.0   4.4  2 X Average Risk   9.6   7.1  3 X Average Risk  23.4   11.0        Use the calculated Patient Ratio above and the CHD Risk Table to determine the patient's CHD Risk.        ATP III CLASSIFICATION (LDL):  <100     mg/dL   Optimal  100-129  mg/dL   Near or Above                    Optimal  130-159  mg/dL   Borderline  160-189  mg/dL   High  >190     mg/dL   Very High   Heparin level (unfractionated)     Status: None   Collection Time: 09/11/15 12:52 AM  Result Value Ref Range   Heparin Unfractionated 0.31 0.30 - 0.70 IU/mL    Comment:        IF HEPARIN RESULTS ARE BELOW EXPECTED VALUES, AND PATIENT DOSAGE HAS BEEN CONFIRMED, SUGGEST FOLLOW UP TESTING OF ANTITHROMBIN III LEVELS.   Troponin I     Status: Abnormal   Collection Time: 09/11/15 12:52 AM  Result Value Ref Range   Troponin I 0.99 (HH) <0.03 ng/mL    Comment: CRITICAL VALUE NOTED.  VALUE IS CONSISTENT WITH PREVIOUSLY REPORTED AND CALLED VALUE.  Troponin I     Status: Abnormal   Collection Time: 09/11/15  5:47 AM  Result Value Ref Range   Troponin I 0.82 (HH) <0.03 ng/mL    Comment: CRITICAL VALUE NOTED.  VALUE IS CONSISTENT WITH PREVIOUSLY REPORTED AND CALLED VALUE.  Basic metabolic panel     Status: Abnormal   Collection Time: 09/11/15  5:47 AM  Result Value Ref Range   Sodium 135 135 - 145 mmol/L   Potassium 4.1 3.5 - 5.1 mmol/L   Chloride 97 (L) 101 - 111 mmol/L   CO2 28 22 - 32 mmol/L   Glucose, Bld 173 (H) 65 - 99 mg/dL   BUN 16 6 - 20 mg/dL   Creatinine, Ser 1.24 0.61 - 1.24 mg/dL   Calcium 9.4 8.9 - 10.3 mg/dL   GFR calc non Af Amer >60 >60  mL/min   GFR calc Af Amer >60 >60 mL/min    Comment: (NOTE) The eGFR has been calculated using the CKD EPI equation. This calculation has not been validated in all clinical situations. eGFR's persistently <60 mL/min signify possible Chronic Kidney Disease.    Anion gap 10 5 - 15  CBC     Status: Abnormal   Collection Time: 09/11/15  5:47 AM  Result Value Ref Range   WBC 10.4 4.0 - 10.5 K/uL   RBC 5.19 4.22 - 5.81 MIL/uL   Hemoglobin 13.4 13.0 - 17.0 g/dL   HCT 41.8 39.0 - 52.0 %   MCV 80.5 78.0 - 100.0 fL   MCH 25.8 (L) 26.0 - 34.0 pg   MCHC 32.1 30.0 - 36.0 g/dL   RDW 16.2 (H) 11.5 - 15.5 %   Platelets 213 150 - 400 K/uL  Glucose, capillary     Status: Abnormal   Collection Time: 09/11/15  7:34 AM  Result Value Ref Range   Glucose-Capillary 194 (H) 65 - 99 mg/dL   Comment 1 Document in Chart   Heparin level (unfractionated)     Status: None   Collection Time: 09/11/15  9:51 AM  Result Value Ref Range   Heparin Unfractionated 0.44 0.30 - 0.70 IU/mL    Comment:        IF HEPARIN RESULTS ARE BELOW EXPECTED VALUES, AND PATIENT DOSAGE HAS BEEN CONFIRMED, SUGGEST FOLLOW UP TESTING OF ANTITHROMBIN III LEVELS.   Glucose, capillary     Status: Abnormal   Collection Time: 09/11/15 11:27 AM  Result Value Ref Range   Glucose-Capillary 214 (H) 65 - 99 mg/dL   Comment 1 Document in Chart     Imaging: Imaging results have been reviewed  Tele- NSR with PVCs  Assessment/Plan:   1. Active Problems: 2.   Precordial chest pain 3. CAD 4. Recent NSTEMI/ PCI/DES RCA  Time Spent Directly with Patient:  15 minutes  Length of Stay:  LOS: 1 day   Pt admitted with LUE pain 2 days after recent DC for NSTEMI Rx with PCI / DES to RCA. Enz neg. EKG w/o acute changes. Labs OK. ON IV hep which I will DC. Doubt ischemic pain . Already on imdur, will increase to 60 mg. Pt asking for 'pain meds ' for LUE discomfort. ? Drug seeking behavior. Ambulate today and prob DC home AM  Quay Burow 09/11/2015, 11:51 AM

## 2015-09-11 NOTE — Progress Notes (Signed)
Inpatient Diabetes Program Recommendations  AACE/ADA: New Consensus Statement on Inpatient Glycemic Control (2015)  Target Ranges:  Prepandial:   less than 140 mg/dL      Peak postprandial:   less than 180 mg/dL (1-2 hours)      Critically ill patients:  140 - 180 mg/dL   Lab Results  Component Value Date   GLUCAP 194 (H) 09/11/2015   HGBA1C 12.4 (H) 09/04/2015    Review of Glycemic ControlResults for CAMERYN, GILDEA (MRN [id number]) as of 09/11/2015 11:19  Ref. Range 09/10/2015 16:24 09/10/2015 19:55 09/11/2015 07:34  Glucose-Capillary Latest Ref Range: 65 - 99 mg/dL 138 (H) 290 (H) 194 (H)   Diabetes history:Type 2 diabetes Outpatient Diabetes medications: Glipizide 5 mg bid, Lantus 25-35 units q HS+ Metformin 1 gm bid  Current orders for Inpatient glycemic control:  Lantus 25 units q HS, Glipizide 5 mg bid, Novolog moderate tid with meals  Inpatient Diabetes Program Recommendations:    Please consider adding Novolog meal coverage 4 units tid with meals.  Thanks, Adah Perl, RN, BC-ADM Inpatient Diabetes Coordinator Pager [phone] (8a-5p)

## 2015-09-11 NOTE — Progress Notes (Signed)
ANTICOAGULATION CONSULT NOTE - Follow Up Consult  Pharmacy Consult for Heparin  Indication: chest pain/ACS  No Known Allergies  Patient Measurements: Height: 5\' 9"  (175.3 cm) Weight: 240 lb 9.6 oz (109.1 kg) IBW/kg (Calculated) : 70.7  Vital Signs: Temp: 98 F (36.7 C) (08/31 1956) Temp Source: Oral (08/31 1956) BP: 107/64 (08/31 1956) Pulse Rate: 73 (08/31 1956)  Labs:  Recent Labs  09/08/15 1008 09/10/15 0859 09/10/15 1933 09/11/15 0052  HGB  --  14.5  --   --   HCT  --  43.6  --   --   PLT  --  222  --   --   HEPARINUNFRC  --   --   --  0.31  CREATININE  --  1.44*  --   --   TROPONINI 3.72* 1.17* 1.10*  --     Estimated Creatinine Clearance: 65.6 mL/min (by C-G formula based on SCr of 1.44 mg/dL).   Assessment: Re-admitted 8/31 after DES on 8/28, troponin trending down, HL is therapeutic x 1  Goal of Therapy:  Heparin level 0.3-0.7 units/ml Monitor platelets by anticoagulation protocol: Yes   Plan:  -Cont heparin at 1500 units/hr -1000 HL  Judit Awad 09/11/2015,2:35 AM

## 2015-09-11 NOTE — Progress Notes (Signed)
ANTICOAGULATION CONSULT NOTE - Follow Up Consult  Pharmacy Consult for Heparin  Indication: chest pain/ACS  No Known Allergies  Patient Measurements: Height: 5\' 9"  (175.3 cm) Weight: 242 lb 14.4 oz (110.2 kg) IBW/kg (Calculated) : 70.7  Vital Signs: Temp: 97.9 F (36.6 C) (09/01 0339) Temp Source: Oral (09/01 0339) BP: 102/54 (09/01 0829) Pulse Rate: 70 (09/01 0339)  Labs:  Recent Labs  09/10/15 0859 09/10/15 1933 09/11/15 0052 09/11/15 0547 09/11/15 0951  HGB 14.5  --   --  13.4  --   HCT 43.6  --   --  41.8  --   PLT 222  --   --  213  --   HEPARINUNFRC  --   --  0.31  --  0.44  CREATININE 1.44*  --   --  1.24  --   TROPONINI 1.17* 1.10* 0.99* 0.82*  --     Estimated Creatinine Clearance: 76.5 mL/min (by C-G formula based on SCr of 1.24 mg/dL).   Assessment: Continues on heparin for r/o ACS. S/P LHC with successful DES to mid RCA on previous admission 8/28. HL remains therapeutic today at 0.44. CBC WNL and stable. No bleeding reported.   Goal of Therapy:  Heparin level 0.3-0.7 units/ml Monitor platelets by anticoagulation protocol: Yes   Plan:  -Cont heparin 1500 units/hr -Daily CBC/HL -Monitor for bleeding   Stephens November, PharmD Clinical Pharmacist 11:17 AM, 09/11/2015

## 2015-09-12 DIAGNOSIS — E1149 Type 2 diabetes mellitus with other diabetic neurological complication: Secondary | ICD-10-CM

## 2015-09-12 DIAGNOSIS — R0789 Other chest pain: Principal | ICD-10-CM

## 2015-09-12 DIAGNOSIS — I10 Essential (primary) hypertension: Secondary | ICD-10-CM

## 2015-09-12 DIAGNOSIS — E119 Type 2 diabetes mellitus without complications: Secondary | ICD-10-CM

## 2015-09-12 DIAGNOSIS — Z9861 Coronary angioplasty status: Secondary | ICD-10-CM

## 2015-09-12 DIAGNOSIS — I472 Ventricular tachycardia: Secondary | ICD-10-CM

## 2015-09-12 DIAGNOSIS — Z794 Long term (current) use of insulin: Secondary | ICD-10-CM

## 2015-09-12 DIAGNOSIS — I4729 Other ventricular tachycardia: Secondary | ICD-10-CM

## 2015-09-12 DIAGNOSIS — Z720 Tobacco use: Secondary | ICD-10-CM

## 2015-09-12 DIAGNOSIS — F1410 Cocaine abuse, uncomplicated: Secondary | ICD-10-CM

## 2015-09-12 LAB — HEPARIN LEVEL (UNFRACTIONATED)

## 2015-09-12 LAB — GLUCOSE, CAPILLARY
GLUCOSE-CAPILLARY: 142 mg/dL — AB (ref 65–99)
Glucose-Capillary: 233 mg/dL — ABNORMAL HIGH (ref 65–99)

## 2015-09-12 MED ORDER — ISOSORBIDE MONONITRATE ER 60 MG PO TB24: 60.0000 mg | ORAL_TABLET | Freq: Every day | ORAL | 11 refills | Status: DC

## 2015-09-12 MED ORDER — TRAMADOL HCL 50 MG PO TABS: 50.0000 mg | ORAL_TABLET | Freq: Four times a day (QID) | ORAL | 0 refills | Status: DC | PRN

## 2015-09-12 MED ORDER — TRAMADOL HCL 50 MG PO TABS
50.0000 mg | ORAL_TABLET | Freq: Two times a day (BID) | ORAL | Status: DC | PRN
  Administered 2015-09-12: 50 mg via ORAL
  Filled 2015-09-12: qty 1

## 2015-09-12 MED ORDER — CARVEDILOL 3.125 MG PO TABS: 3.1250 mg | ORAL_TABLET | Freq: Two times a day (BID) | ORAL | Status: DC

## 2015-09-12 MED ORDER — CARVEDILOL 3.125 MG PO TABS: 3.1250 mg | ORAL_TABLET | Freq: Two times a day (BID) | ORAL | 11 refills | Status: DC

## 2015-09-12 NOTE — Progress Notes (Signed)
Pt had a run of 6 v-tach, he is asymptomatic. MD made aware. Will continue to monitor closely. Ferdinand Lango, RN

## 2015-09-12 NOTE — Progress Notes (Signed)
  DAILY PROGRESS NOTE  Subjective:  6 beat run of NSVT overnight. Heparin discontinued. Left scapular pain improved.  Objective:  Temp:  [97.8 F (36.6 C)-98 F (36.7 C)] 98 F (36.7 C) (09/02 0500) Pulse Rate:  [67-72] 67 (09/02 0500) Resp:  [18] 18 (09/02 0500) BP: (97-108)/(66-73) 105/68 (09/02 0500) SpO2:  [95 %-99 %] 95 % (09/02 0500) Weight:  [243 lb 1.6 oz (110.3 kg)] 243 lb 1.6 oz (110.3 kg) (09/02 0500) Weight change: 1.6 oz (0.045 kg)  Intake/Output from previous day: 09/01 0701 - 09/02 0700 In: 120 [P.O.:120] Out: -   Intake/Output from this shift: No intake/output data recorded.  Medications: No current facility-administered medications on file prior to encounter.    Current Outpatient Prescriptions on File Prior to Encounter  Medication Sig Dispense Refill  . acetaminophen (TYLENOL) 325 MG tablet Take 2 tablets (650 mg total) by mouth every 4 (four) hours as needed for headache or mild pain.    . aspirin EC 81 MG tablet Take 81 mg by mouth daily.     . glipiZIDE (GLUCOTROL) 5 MG tablet Take 5 mg by mouth 2 (two) times daily.    . hydrocortisone cream 0.5 % Apply 1 application topically 2 (two) times daily.    . insulin glargine (LANTUS) 100 UNIT/ML injection Inject 25-35 Units into the skin at bedtime.     . lisinopril (PRINIVIL,ZESTRIL) 5 MG tablet Take 2.5 mg by mouth daily.     . metFORMIN (GLUCOPHAGE) 1000 MG tablet Take 1 tablet (1,000 mg total) by mouth 2 (two) times daily with a meal. Do not restart Metformin until Wednesday am 7/2 (Patient taking differently: Take 1,000 mg by mouth 2 (two) times daily. )    . nitroGLYCERIN (NITROSTAT) 0.4 MG SL tablet Place 1 tablet (0.4 mg total) under the tongue every 5 (five) minutes x 3 doses as needed for chest pain. 25 tablet 12  . prasugrel (EFFIENT) 10 MG TABS tablet Take 1 tablet (10 mg total) by mouth daily. 30 tablet 11  . Vitamin D, Ergocalciferol, (DRISDOL) 50000 units CAPS capsule Take 50,000 Units by  mouth every 7 (seven) days.      Physical Exam: General appearance: alert and no distress Lungs: clear to auscultation bilaterally Heart: regular rate and rhythm, S1, S2 normal, no murmur, click, rub or gallop Extremities: extremities normal, atraumatic, no cyanosis or edema and mild POP over left scapula Neurologic: Grossly normal  Lab Results: Results for orders placed or performed during the hospital encounter of 09/10/15 (from the past 48 hour(s))  Urine rapid drug screen (hosp performed)     Status: Abnormal   Collection Time: 09/10/15 10:52 AM  Result Value Ref Range   Opiates POSITIVE (A) NONE DETECTED   Cocaine NONE DETECTED NONE DETECTED   Benzodiazepines NONE DETECTED NONE DETECTED   Amphetamines NONE DETECTED NONE DETECTED   Tetrahydrocannabinol NONE DETECTED NONE DETECTED   Barbiturates NONE DETECTED NONE DETECTED    Comment:        DRUG SCREEN FOR MEDICAL PURPOSES ONLY.  IF CONFIRMATION IS NEEDED FOR ANY PURPOSE, NOTIFY LAB WITHIN 5 DAYS.        LOWEST DETECTABLE LIMITS FOR URINE DRUG SCREEN Drug Class       Cutoff (ng/mL) Amphetamine      1000 Barbiturate      200 Benzodiazepine   200 Tricyclics       300 Opiates          300 Cocaine            300 THC              50   Glucose, capillary     Status: Abnormal   Collection Time: 09/10/15  4:24 PM  Result Value Ref Range   Glucose-Capillary 138 (H) 65 - 99 mg/dL   Comment 1 Notify RN    Comment 2 Document in Chart   Troponin I     Status: Abnormal   Collection Time: 09/10/15  7:33 PM  Result Value Ref Range   Troponin I 1.10 (HH) <0.03 ng/mL    Comment: CRITICAL VALUE NOTED.  VALUE IS CONSISTENT WITH PREVIOUSLY REPORTED AND CALLED VALUE.  Glucose, capillary     Status: Abnormal   Collection Time: 09/10/15  7:55 PM  Result Value Ref Range   Glucose-Capillary 290 (H) 65 - 99 mg/dL  Lipid panel     Status: Abnormal   Collection Time: 09/11/15 12:52 AM  Result Value Ref Range   Cholesterol 161 0 - 200  mg/dL   Triglycerides 174 (H) <150 mg/dL   HDL 25 (L) >40 mg/dL   Total CHOL/HDL Ratio 6.4 RATIO   VLDL 35 0 - 40 mg/dL   LDL Cholesterol 101 (H) 0 - 99 mg/dL    Comment:        Total Cholesterol/HDL:CHD Risk Coronary Heart Disease Risk Table                     Men   Women  1/2 Average Risk   3.4   3.3  Average Risk       5.0   4.4  2 X Average Risk   9.6   7.1  3 X Average Risk  23.4   11.0        Use the calculated Patient Ratio above and the CHD Risk Table to determine the patient's CHD Risk.        ATP III CLASSIFICATION (LDL):  <100     mg/dL   Optimal  100-129  mg/dL   Near or Above                    Optimal  130-159  mg/dL   Borderline  160-189  mg/dL   High  >190     mg/dL   Very High   Heparin level (unfractionated)     Status: None   Collection Time: 09/11/15 12:52 AM  Result Value Ref Range   Heparin Unfractionated 0.31 0.30 - 0.70 IU/mL    Comment:        IF HEPARIN RESULTS ARE BELOW EXPECTED VALUES, AND PATIENT DOSAGE HAS BEEN CONFIRMED, SUGGEST FOLLOW UP TESTING OF ANTITHROMBIN III LEVELS.   Troponin I     Status: Abnormal   Collection Time: 09/11/15 12:52 AM  Result Value Ref Range   Troponin I 0.99 (HH) <0.03 ng/mL    Comment: CRITICAL VALUE NOTED.  VALUE IS CONSISTENT WITH PREVIOUSLY REPORTED AND CALLED VALUE.  Troponin I     Status: Abnormal   Collection Time: 09/11/15  5:47 AM  Result Value Ref Range   Troponin I 0.82 (HH) <0.03 ng/mL    Comment: CRITICAL VALUE NOTED.  VALUE IS CONSISTENT WITH PREVIOUSLY REPORTED AND CALLED VALUE.  Basic metabolic panel     Status: Abnormal   Collection Time: 09/11/15  5:47 AM  Result Value Ref Range   Sodium 135 135 - 145 mmol/L   Potassium 4.1 3.5 - 5.1 mmol/L   Chloride 97 (L) 101 -  111 mmol/L   CO2 28 22 - 32 mmol/L   Glucose, Bld 173 (H) 65 - 99 mg/dL   BUN 16 6 - 20 mg/dL   Creatinine, Ser 1.24 0.61 - 1.24 mg/dL   Calcium 9.4 8.9 - 10.3 mg/dL   GFR calc non Af Amer >60 >60 mL/min   GFR calc Af  Amer >60 >60 mL/min    Comment: (NOTE) The eGFR has been calculated using the CKD EPI equation. This calculation has not been validated in all clinical situations. eGFR's persistently <60 mL/min signify possible Chronic Kidney Disease.    Anion gap 10 5 - 15  CBC     Status: Abnormal   Collection Time: 09/11/15  5:47 AM  Result Value Ref Range   WBC 10.4 4.0 - 10.5 K/uL   RBC 5.19 4.22 - 5.81 MIL/uL   Hemoglobin 13.4 13.0 - 17.0 g/dL   HCT 41.8 39.0 - 52.0 %   MCV 80.5 78.0 - 100.0 fL   MCH 25.8 (L) 26.0 - 34.0 pg   MCHC 32.1 30.0 - 36.0 g/dL   RDW 16.2 (H) 11.5 - 15.5 %   Platelets 213 150 - 400 K/uL  Glucose, capillary     Status: Abnormal   Collection Time: 09/11/15  7:34 AM  Result Value Ref Range   Glucose-Capillary 194 (H) 65 - 99 mg/dL   Comment 1 Document in Chart   Heparin level (unfractionated)     Status: None   Collection Time: 09/11/15  9:51 AM  Result Value Ref Range   Heparin Unfractionated 0.44 0.30 - 0.70 IU/mL    Comment:        IF HEPARIN RESULTS ARE BELOW EXPECTED VALUES, AND PATIENT DOSAGE HAS BEEN CONFIRMED, SUGGEST FOLLOW UP TESTING OF ANTITHROMBIN III LEVELS.   Glucose, capillary     Status: Abnormal   Collection Time: 09/11/15 11:27 AM  Result Value Ref Range   Glucose-Capillary 214 (H) 65 - 99 mg/dL   Comment 1 Document in Chart   Glucose, capillary     Status: Abnormal   Collection Time: 09/11/15  4:33 PM  Result Value Ref Range   Glucose-Capillary 175 (H) 65 - 99 mg/dL  Glucose, capillary     Status: Abnormal   Collection Time: 09/11/15  8:31 PM  Result Value Ref Range   Glucose-Capillary 163 (H) 65 - 99 mg/dL  Heparin level (unfractionated)     Status: Abnormal   Collection Time: 09/12/15  3:47 AM  Result Value Ref Range   Heparin Unfractionated <0.10 (L) 0.30 - 0.70 IU/mL    Comment:        IF HEPARIN RESULTS ARE BELOW EXPECTED VALUES, AND PATIENT DOSAGE HAS BEEN CONFIRMED, SUGGEST FOLLOW UP TESTING OF ANTITHROMBIN III LEVELS.     Glucose, capillary     Status: Abnormal   Collection Time: 09/12/15  7:42 AM  Result Value Ref Range   Glucose-Capillary 142 (H) 65 - 99 mg/dL    Imaging: No results found.  Assessment:  Principal Problem:   Musculoskeletal chest pain Active Problems:   S/P LAD DES June 2014   Tobacco user   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   Essential hypertension   Insulin dependent diabetes mellitus (HCC)   Cocaine use   NSVT (nonsustained ventricular tachycardia) (HCC)   Plan:  1. Chest pain has improved. Troponin trending down, consistent with recent NSTEMI and PCI to the RCA. No acute ischemic EKG changes. 6 beat NSVT overnight. Will d/c lisinopril (  due to hypotension and little BP room) and start carvedilol 3.125 mg BID for CAD, ischemic CM (recent LVEF 45-50%) and NSVT overnight. Rx for short supply of tramadol (#30) for MSK scapular pain. [place] for d/c home today. Follow-up with Dr. Radford Pax.  Time Spent Directly with Patient:  15 minutes  Length of Stay:  LOS: 2 days   Pixie Casino, MD, [facility] Attending Cardiologist [place] 09/12/2015, 10:26 AM

## 2015-09-12 NOTE — Discharge Summary (Signed)
Discharge Summary    Patient ID: [name],  MRN: [id number], DOB/AGE: [birth year] 60 y.o.  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 He was recently discharged on 09/08/2015 after being admitted for syncope. He was found to have elevated troponin that peaked at 15.34. He was taken to the cath lab on 09/07/2015 and had successful stenting to his mid RCA. He also has a previously placed stent to his proximal LAD. [facility] went to [facility] with L arm and shoulder pain, ?angina. He was transferred to [facility] and admitted.  Hospital Course     Consultants: None   Cardiac enzymes were elevated but trending down, essentially negative for a new MI. He was asking for pain medication, but these were limited in the setting of no objective evidence of ischemia/infarction.   He was ambulated and remained stable. On 09/02, he was seen by Dr Debara Pickett and all data were reviewed. 6 bt NSVT seen on telemetry, but he was asymptomatic. No further inpatient workup was indicated and he is considered stable for discharge, to follow up as an outpatient. He prefers to follow up in [place], a message has been sent.    He was given Rx for Tramadol, but no other narcotics will be  provided. _____________  Discharge Vitals Blood pressure 105/68, pulse 67, temperature 98 F (36.7 C), temperature source Oral, resp. rate 18, height 5\' 9"  (1.753 m), weight 243 lb 1.6 oz (110.3 kg), SpO2 95 %.  Filed Weights   09/10/15 1538 09/11/15 0339 09/12/15 0500  Weight: 240 lb 9.6 oz (109.1 kg) 242 lb 14.4 oz (110.2 kg) 243 lb 1.6 oz (110.3 kg)    Labs & Radiologic Studies    CBC  Recent Labs  09/10/15 0859 09/11/15 0547  WBC 11.2* 10.4  HGB 14.5 13.4  HCT 43.6 41.8  MCV 79.6 80.5  PLT 222 [id number]   Basic Metabolic Panel  Recent Labs  09/10/15 0859 09/11/15 0547  NA 136 135  K 4.3 4.1  CL 103 97*  CO2 25 28  GLUCOSE 227* 173*  BUN 21* 16  CREATININE 1.44* 1.24  CALCIUM 9.1 9.4   Cardiac Enzymes  Recent Labs  09/10/15 1933 09/11/15 0052 09/11/15 0547  TROPONINI 1.10* 0.99* 0.82*   Fasting Lipid Panel  Recent Labs  09/11/15 0052  CHOL 161  HDL 25*  LDLCALC 101*  TRIG 174*  CHOLHDL 6.4   _____________  Dg Chest 2 View Result Date: 09/10/2015 CLINICAL DATA:  Stent placement. EXAM: CHEST  2 VIEW COMPARISON:  09/04/2015. FINDINGS: Mediastinum hilar structures normal. Low lung volumes with mild bibasilar atelectasis. Cardiomegaly with normal pulmonary vascularity. Small bilateral pleural effusions. No pneumothorax. IMPRESSION: 1. Cardiomegaly. 2. Low lung volumes with mild bibasilar  atelectasis. Small bilateral pleural effusions. Electronically Signed   By: Marcello Moores  Register   On: 09/10/2015 09:20    Disposition   Pt is being discharged home today in good condition.  Follow-up Plans & Appointments     Discharge Instructions    Diet - low sodium heart healthy    Complete by:  As directed   Diet Carb Modified    Complete by:  As directed   Increase activity slowly    Complete by:  As directed      Discharge Medications   Current Discharge Medication List    START taking these medications   Details  carvedilol (COREG) 3.125 MG tablet Take  1 tablet (3.125 mg total) by mouth 2 (two) times daily with a meal. Qty: 60 tablet, Refills: 11    isosorbide mononitrate (IMDUR) 60 MG 24 hr tablet Take 1 tablet (60 mg total) by mouth daily. Qty: 60 tablet, Refills: 11    traMADol (ULTRAM) 50 MG tablet Take 1-2 tablets (50-100 mg total) by mouth every 6 (six) hours as needed. Qty: 30 tablet, Refills: 0      CONTINUE these medications which have NOT CHANGED   Details  acetaminophen (TYLENOL) 325 MG tablet Take 2 tablets (650 mg total) by mouth every 4 (four) hours as needed for headache or mild pain.    aspirin EC 81 MG tablet Take 81 mg by mouth daily.     atorvastatin (LIPITOR) 80 MG tablet Take 80 mg by mouth at bedtime.    glipiZIDE (GLUCOTROL) 5 MG tablet Take 5 mg by mouth 2 (two) times daily.    hydrocortisone cream 0.5 % Apply 1 application topically 2 (two) times daily.    insulin glargine (LANTUS) 100 UNIT/ML injection Inject 25-35 Units into the skin at bedtime.     lisinopril (PRINIVIL,ZESTRIL) 5 MG tablet Take 2.5 mg by mouth daily.     metFORMIN (GLUCOPHAGE) 1000 MG tablet Take 1 tablet (1,000 mg total) by mouth 2 (two) times daily with a meal. Do not restart Metformin until Wednesday am 7/2    nitroGLYCERIN (NITROSTAT) 0.4 MG SL tablet Place 1 tablet (0.4 mg total) under the tongue every 5 (five) minutes x 3 doses as needed for chest pain. Qty: 25 tablet, Refills: 12    prasugrel (EFFIENT) 10 MG TABS tablet Take 1 tablet (10 mg total) by mouth daily. Qty: 30 tablet, Refills: 11    Vitamin D, Ergocalciferol, (DRISDOL) 50000 units CAPS capsule Take 50,000 Units by mouth every 7 (seven) days.          Outstanding Labs/Studies   None  Duration of Discharge Encounter   Greater than 30 minutes including physician time.  Jonetta Speak NP 09/12/2015, 11:54 AM

## 2015-09-24 ENCOUNTER — Encounter: Payer: Self-pay | Admitting: Adult Health

## 2015-09-24 ENCOUNTER — Ambulatory Visit (INDEPENDENT_AMBULATORY_CARE_PROVIDER_SITE_OTHER): Payer: Medicare Other | Admitting: Adult Health

## 2015-09-24 VITALS — BP 100/78 | HR 79 | Ht 69.0 in | Wt 240.0 lb

## 2015-09-24 DIAGNOSIS — I255 Ischemic cardiomyopathy: Secondary | ICD-10-CM | POA: Diagnosis not present

## 2015-09-24 DIAGNOSIS — R42 Dizziness and giddiness: Secondary | ICD-10-CM | POA: Diagnosis not present

## 2015-09-24 DIAGNOSIS — F1910 Other psychoactive substance abuse, uncomplicated: Secondary | ICD-10-CM

## 2015-09-24 DIAGNOSIS — I2510 Atherosclerotic heart disease of native coronary artery without angina pectoris: Secondary | ICD-10-CM

## 2015-09-24 DIAGNOSIS — I2589 Other forms of chronic ischemic heart disease: Secondary | ICD-10-CM

## 2015-09-24 MED ORDER — ISOSORBIDE MONONITRATE ER 30 MG PO TB24: 30.0000 mg | ORAL_TABLET | Freq: Every day | ORAL | 6 refills | Status: DC

## 2015-09-24 NOTE — Progress Notes (Signed)
Name: [name]    DOB: [birth year]  Age: 60 y.o.  MR#: [id number]       PCP:  [name], MD      Insurance: Payor: MEDICARE / Plan: MEDICARE PART A AND B / Product Type: *No Product type* /   CC:   No chief complaint on file.   VS Vitals:   09/24/15 1423  BP: 100/78  Pulse: 79  SpO2: 99%  Weight: 240 lb (108.9 kg)  Height: 5\' 9"  (1.753 m)    Weights Current Weight  09/24/15 240 lb (108.9 kg)  09/12/15 243 lb 1.6 oz (110.3 kg)  09/08/15 246 lb 4.1 oz (111.7 kg)    Blood Pressure  BP Readings from Last 3 Encounters:  09/24/15 100/78  09/12/15 105/68  09/08/15 127/80     Admit date:  (Not on file) Last encounter with RMR:  Visit date not found   Allergy Review of patient's allergies indicates no known allergies.  Current Outpatient Prescriptions  Medication Sig Dispense Refill  . acetaminophen (TYLENOL) 325 MG tablet Take 2 tablets (650 mg total) by mouth every 4 (four) hours as needed for headache or mild pain.    Marland Kitchen aspirin EC 81 MG tablet Take 81 mg by mouth daily.     Marland Kitchen atorvastatin (LIPITOR) 80 MG tablet Take 80 mg by mouth at bedtime.    . carvedilol (COREG) 3.125 MG tablet Take 1 tablet (3.125 mg total) by mouth 2 (two) times daily with a meal. 60 tablet 11  . glipiZIDE (GLUCOTROL) 5 MG tablet Take 5 mg by mouth 2 (two) times daily.    . hydrocortisone cream 0.5 % Apply 1 application topically 2 (two) times daily.    . insulin glargine (LANTUS) 100 UNIT/ML injection Inject 25-35 Units into the skin at bedtime.     . isosorbide mononitrate (IMDUR) 60 MG 24 hr tablet Take 1 tablet (60 mg total) by mouth daily. 60 tablet 11  . lisinopril (PRINIVIL,ZESTRIL) 5 MG tablet Take 2.5 mg by mouth daily.     . metFORMIN (GLUCOPHAGE) 1000 MG tablet Take 1 tablet (1,000 mg total) by mouth 2 (two) times daily with a meal. Do not restart Metformin until Wednesday am 7/2 (Patient taking differently: Take 1,000 mg by mouth 2 (two) times daily. )    . nitroGLYCERIN  (NITROSTAT) 0.4 MG SL tablet Place 1 tablet (0.4 mg total) under the tongue every 5 (five) minutes x 3 doses as needed for chest pain. 25 tablet 12  . prasugrel (EFFIENT) 10 MG TABS tablet Take 1 tablet (10 mg total) by mouth daily. 30 tablet 11  . traMADol (ULTRAM) 50 MG tablet Take 1-2 tablets (50-100 mg total) by mouth every 6 (six) hours as needed. 30 tablet 0  . Vitamin D, Ergocalciferol, (DRISDOL) 50000 units CAPS capsule Take 50,000 Units by mouth every 7 (seven) days.     No current facility-administered medications for this visit.     Discontinued Meds:   There are no discontinued medications.  Patient Active Problem List   Diagnosis Date Noted  . NSVT (nonsustained ventricular tachycardia) ([place]) 09/12/2015  . Precordial chest pain 09/10/2015  . Cocaine use 09/04/2015  . Near syncope 06/29/2015  . Essential hypertension 06/29/2015  . History of heart artery stents 06/29/2015  . Hypovolemia 06/29/2015  . Insulin dependent diabetes mellitus ([place]) 06/29/2015  . History of MI (myocardial infarction) 06/29/2015  . Chronic systolic CHF [id number]  . Musculoskeletal chest pain 05/27/2015  . Rectal bleeding   .  Stage 3 chronic renal impairment associated with type 2 diabetes mellitus ([place]) 09/09/2013  . 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 CAD- residual RCA and OM disease on re-look cath 09/07/13 09/09/2013  . Cardiomyopathy, ischemic-EF 40-45% by echo 09/07/13 09/09/2013  . S/P LAD DES June 2014 09/06/2013  . Tobacco user 09/06/2013  . Hyperglycemia 08/22/2012  . NSTEMI (non-ST elevated myocardial infarction) ([place]) 07/07/2012  . Dyslipidemia 07/07/2012  . Hypertensive heart disease     LABS    Component Value Date/Time   NA 135 09/11/2015 0547   NA 136 09/10/2015 0859   NA 136 09/08/2015 0228   K 4.1 09/11/2015 0547   K 4.3 09/10/2015 0859   K 4.3 09/08/2015 0228   CL 97 (L) 09/11/2015 0547   CL 103 09/10/2015 0859   CL 101 09/08/2015 0228   CO2 28  09/11/2015 0547   CO2 25 09/10/2015 0859   CO2 30 09/08/2015 0228   GLUCOSE 173 (H) 09/11/2015 0547   GLUCOSE 227 (H) 09/10/2015 0859   GLUCOSE 175 (H) 09/08/2015 0228   BUN 16 09/11/2015 0547   BUN 21 (H) 09/10/2015 0859   BUN 7 09/08/2015 0228   CREATININE 1.24 09/11/2015 0547   CREATININE 1.44 (H) 09/10/2015 0859   CREATININE 1.19 09/08/2015 0228   CALCIUM 9.4 09/11/2015 0547   CALCIUM 9.1 09/10/2015 0859   CALCIUM 9.1 09/08/2015 0228   GFRNONAA >60 09/11/2015 0547   GFRNONAA 51 (L) 09/10/2015 0859   GFRNONAA >60 09/08/2015 0228   GFRAA >60 09/11/2015 0547   GFRAA 59 (L) 09/10/2015 0859   GFRAA >60 09/08/2015 0228   CMP     Component Value Date/Time   NA 135 09/11/2015 0547   K 4.1 09/11/2015 0547   CL 97 (L) 09/11/2015 0547   CO2 28 09/11/2015 0547   GLUCOSE 173 (H) 09/11/2015 0547   BUN 16 09/11/2015 0547   CREATININE 1.24 09/11/2015 0547   CALCIUM 9.4 09/11/2015 0547   PROT 7.9 09/04/2015 1002   ALBUMIN 3.8 09/04/2015 1002   AST 21 09/04/2015 1002   ALT 28 09/04/2015 1002   ALKPHOS 89 09/04/2015 1002   BILITOT 0.7 09/04/2015 1002   GFRNONAA >60 09/11/2015 0547   GFRAA >60 09/11/2015 0547       Component Value Date/Time   WBC 10.4 09/11/2015 0547   WBC 11.2 (H) 09/10/2015 0859   WBC 10.1 09/08/2015 0228   HGB 13.4 09/11/2015 0547   HGB 14.5 09/10/2015 0859   HGB 14.5 09/08/2015 0228   HCT 41.8 09/11/2015 0547   HCT 43.6 09/10/2015 0859   HCT 44.8 09/08/2015 0228   MCV 80.5 09/11/2015 0547   MCV 79.6 09/10/2015 0859   MCV 80.7 09/08/2015 0228    Lipid Panel     Component Value Date/Time   CHOL 161 09/11/2015 0052   TRIG 174 (H) 09/11/2015 0052   HDL 25 (L) 09/11/2015 0052   CHOLHDL 6.4 09/11/2015 0052   VLDL 35 09/11/2015 0052   LDLCALC 101 (H) 09/11/2015 0052    ABG    Component Value Date/Time   TCO2 24 09/06/2013 1505     Lab Results  Component Value Date   TSH 4.880 (H) 07/14/2012   BNP (last 3 results)  Recent Labs   09/25/14 2007 09/27/14 2052  BNP 47.0 50.5    ProBNP (last 3 results) No results for input(s): PROBNP in the last 8760 hours.  Cardiac Panel (last 3 results) No results for input(s): CKTOTAL,  CKMB, TROPONINI, RELINDX in the last 72 hours.  Iron/TIBC/Ferritin/ %Sat No results found for: IRON, TIBC, FERRITIN, IRONPCTSAT   EKG Orders placed or performed in visit on 09/24/15  . 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 EXAM: CHEST  2 VIEW COMPARISON:  09/04/2015. FINDINGS: Mediastinum hilar structures normal. Low lung volumes with mild bibasilar atelectasis. Cardiomegaly with normal pulmonary vascularity. Small bilateral pleural effusions. No pneumothorax. IMPRESSION: 1. Cardiomegaly. 2. Low lung volumes with mild bibasilar atelectasis. Small bilateral pleural effusions. Electronically Signed   By: Marcello Moores  Register   On: 09/10/2015 09:20   Dg Chest 2 View  Result Date: 09/04/2015 CLINICAL DATA:   CHEST PAIN JUST LEFT TO CENTER AND SOB SINCE THIS AM, HX OF HEART STENTS, LAST ONE APPROX 1 YEAR AGO EXAM: CHEST - 2 VIEW COMPARISON:  06/29/2015 FINDINGS: Linear scarring or subsegmental atelectasis in the lingula seen best on the lateral projection, chronic. Lungs otherwise clear. Heart size upper limits normal for technique. No pneumothorax or effusion. Chronic widening of bilateral acromioclavicular joints. IMPRESSION: No acute cardiopulmonary disease. Electronically Signed   By: Lucrezia Europe M.D.   On: 09/04/2015 11:05

## 2015-09-24 NOTE — Progress Notes (Signed)
Cardiology Office Note   Date:  09/24/2015   ID:  [name], [name] Nov 01, 1955, MRN [id number]  PCP:  [name], MD  Cardiologist: [name], NP   No chief complaint on file.     History of Present Illness: [name] is a 60 y.o. male who presents for ongoing assessment and management of CAD with history of anterior ST elevation MI in 2015, he was recently discharged from [facility] in the setting of syncope. He was found have elevated troponin which peaked at 15.34.   He had cardiac catheterization and successful stenting to his mid RCA, due to 99% mid RCA stenosis. The LAD stent was patent but had mid in-stent restenosis. There was distal LAD disease. He was found to have severe left ventricular systolic function with EF of 25% 35%. Near akinesis of the apex with severe hypokinesis of the mid anterior and mid inferior walls.. The patient did have nonsustained SVT during hospitalization.  The patient required pain medication and was given tramadol on discharge with no refills. He was advised that any narcotics should be provided by his primary care. He was started on carvedilol, isosorbide, and continued on atorvastatin, lisinopril, Effient, and aspirin.  He comes today with generalized fatigue and sluggishness. He has stopped taking Effient since discharge, thinking it was the same medicine as the isosorbide. His wife feels his medicine tray and he has taken it out. He was duplicated medicines. He is unfortunately used cocaine within the last 5 days.   Cardiac Cath 09/08/2015 Conclusion     Mid RCA lesion, 99 %stenosed. Culprit lesion  A STENT SYNERGY DES 3X28 drug eluting stent was successfully placed. Post intervention, there is a 0% residual stenosis.  Distal RCA at the bifurcation, 80% stenosis.Ost RPDA lesion, 70 %stenosed. - Similar to prior procedure  Ostial LAD ~30% followed by STENT in Prox LAD to Mid LAD Stent - 30 %stenosed. Dist LAD  lesion, 65 %stenosed.  Ost 2nd Diag lesion, 80 %stenosed. Jailed by stent  1st Mrg-1 lesion, 60 %stenosed. 1st Mrg-2 lesion, 90 %stenosed. - Similar to prior procedure  There is severe left ventricular systolic dysfunction. The left ventricular ejection fraction is 25-35% by visual estimate. With near akinesis of the apex and severe hypokinesis of the mid anterior and mid inferior walls.  LV end diastolic pressure is severely elevated.  There is mild (2+) mitral regurgitation.   The patient's coronary artery disease has progressed with significant progression in the mid RCA now to 99% stenosis. The distal RCA and PDA lesions are stable. The OM1 lesions are stable.  The LAD stent is patent but with mild in-stent restenosis. There is distal LAD disease.      Past Medical History:  Diagnosis Date  . CAD (coronary artery disease)    STEMI-PCI   . CHF (congestive heart failure) ([place])   . Coronary artery disease   . Diabetes mellitus without complication ([place])   . DM2 (diabetes mellitus, type 2) ([place])   . HTN (hypertension)   . Hyperlipemia   . Hyperlipidemia   . Hypertension   . MI, old   . Obesity   . Tobacco use     Past Surgical History:  Procedure Laterality Date  . CARDIAC CATHETERIZATION N/A 09/07/2015   Procedure: Left Heart Cath and Coronary Angiography;  Surgeon: Leonie Man, MD;  Location: [facility];  Service: Cardiovascular;  Laterality: N/A;  . CARDIAC CATHETERIZATION N/A 09/07/2015   Procedure: Coronary  Stent Intervention;  Surgeon: Leonie Man, MD;  Location: [facility];  Service: Cardiovascular;  Laterality: N/A;  . CORONARY ANGIOGRAM  09/07/13   residual RCA and OM disease  . CORONARY ANGIOPLASTY WITH STENT PLACEMENT    . LEFT HEART CATH Bilateral 07/08/2012   Procedure: LEFT HEART CATH;  Surgeon: [name], MD;  Location: [facility] CATH LAB;  Service: Cardiovascular;  Laterality: Bilateral;  . LEFT HEART CATHETERIZATION WITH CORONARY  ANGIOGRAM N/A 09/06/2013   STEMI, 2nd ISR LAD. Procedure: LEFT HEART CATHETERIZATION WITH CORONARY ANGIOGRAM;  Surgeon: [name], MD;  Location: [facility] CATH LAB;  Service: Cardiovascular;  Laterality: N/A;  . PERCUTANEOUS CORONARY STENT INTERVENTION (PCI-S)  07/08/2012   Procedure: PERCUTANEOUS CORONARY STENT INTERVENTION (PCI-S);  Surgeon: [name], MD;  Location: [facility] CATH LAB;  Service: Cardiovascular;;  DES LAD  . PERCUTANEOUS CORONARY STENT INTERVENTION (PCI-S) N/A 09/06/2013   Procedure: PERCUTANEOUS CORONARY STENT INTERVENTION (PCI-S);  Surgeon: [name], MD;  Location: [facility] CATH LAB;  Service: Cardiovascular;  Laterality: N/A;  Mid LAD 3.0/24mm Promus     Current Outpatient Prescriptions  Medication Sig Dispense Refill  . acetaminophen (TYLENOL) 325 MG tablet Take 2 tablets (650 mg total) by mouth every 4 (four) hours as needed for headache or mild pain.    Marland Kitchen aspirin EC 81 MG tablet Take 81 mg by mouth daily.     Marland Kitchen atorvastatin (LIPITOR) 80 MG tablet Take 80 mg by mouth at bedtime.    . carvedilol (COREG) 3.125 MG tablet Take 1 tablet (3.125 mg total) by mouth 2 (two) times daily with a meal. 60 tablet 11  . glipiZIDE (GLUCOTROL) 5 MG tablet Take 5 mg by mouth 2 (two) times daily.    . hydrocortisone cream 0.5 % Apply 1 application topically 2 (two) times daily.    . insulin glargine (LANTUS) 100 UNIT/ML injection Inject 25-35 Units into the skin at bedtime.     Marland Kitchen lisinopril (PRINIVIL,ZESTRIL) 5 MG tablet Take 2.5 mg by mouth daily.     . metFORMIN (GLUCOPHAGE) 1000 MG tablet Take 1 tablet (1,000 mg total) by mouth 2 (two) times daily with a meal. Do not restart Metformin until Wednesday am 7/2 (Patient taking differently: Take 1,000 mg by mouth 2 (two) times daily. )    . nitroGLYCERIN (NITROSTAT) 0.4 MG SL tablet Place 1 tablet (0.4 mg total) under the tongue every 5 (five) minutes x 3 doses as needed for chest pain. 25 tablet 12  . prasugrel (EFFIENT) 10 MG TABS  tablet Take 1 tablet (10 mg total) by mouth daily. 30 tablet 11  . traMADol (ULTRAM) 50 MG tablet Take 1-2 tablets (50-100 mg total) by mouth every 6 (six) hours as needed. 30 tablet 0  . Vitamin D, Ergocalciferol, (DRISDOL) 50000 units CAPS capsule Take 50,000 Units by mouth every 7 (seven) days.    . isosorbide mononitrate (IMDUR) 30 MG 24 hr tablet Take 1 tablet (30 mg total) by mouth daily. 30 tablet 6   No current facility-administered medications for this visit.     Allergies:   Review of patient's allergies indicates no known allergies.    Social History:  The patient  reports that he has been smoking Cigarettes.  He has been smoking about 0.50 packs per day. He has never used smokeless tobacco. He reports that he drinks alcohol. He reports that he uses drugs, including Cocaine.   Family History:  The patient's family history includes Diabetes in his  father, mother, and sister; Hypertension in his father, mother, and sister.    ROS: All other systems are reviewed and negative. Unless otherwise mentioned in H&P    PHYSICAL EXAM: VS:  BP 100/78   Pulse 79   Ht 5\' 9"  (1.753 m)   Wt 240 lb (108.9 kg)   SpO2 99%   BMI 35.44 kg/m  , BMI Body mass index is 35.44 kg/m. GEN: Well nourished, well developed, in no acute distress  HEENT: normal  Neck: no JVD, carotid bruits, or masses Cardiac: RRR; no murmurs, rubs, or gallops,no edema  Respiratory:  clear to auscultation bilaterally, normal work of breathing GI: soft, nontender, nondistended, + BS MS: no deformity or atrophy  Skin: warm and dry, no rash Neuro:  Strength and sensation are intact Psych: euthymic mood, full affect   EKG:  The ekg ordered today demonstrates normal sinus rhythm rate of 65 bpm with inferior and anterior lateral infarct age undetermined. Q waves are prominent.   Recent Labs: 09/27/2014: B Natriuretic Peptide 50.5 09/04/2015: ALT 28 09/11/2015: BUN 16; Creatinine, Ser 1.24; Hemoglobin 13.4; Platelets  213; Potassium 4.1; Sodium 135    Lipid Panel    Component Value Date/Time   CHOL 161 09/11/2015 0052   TRIG 174 (H) 09/11/2015 0052   HDL 25 (L) 09/11/2015 0052   CHOLHDL 6.4 09/11/2015 0052   VLDL 35 09/11/2015 0052   LDLCALC 101 (H) 09/11/2015 0052      Wt Readings from Last 3 Encounters:  09/24/15 240 lb (108.9 kg)  09/12/15 243 lb 1.6 oz (110.3 kg)  09/08/15 246 lb 4.1 oz (111.7 kg)     ASSESSMENT AND PLAN:  1. Coronary artery disease: Status post hospitalization requiring cardiac catheterization in the setting of non-ST elevation MI right coronary artery requiring drug-eluting stent. Unfortunately, he stopped taking Effient the day he returned home from the hospital thinking he was duplicated medicines. He was started on isosorbide and carvedilol. He got the pills mixed up. Continue aspirin.  I have brought his wife in from the waiting room and discussed with her the need to restart Effient and to place it back in his medication tray. I explained to him the importance of taking antiplatelets especially with stents within his coronary arteries. Was seen back in a week.  2. Hypotension: Multifactorial in the setting of significant systolic dysfunction with EF of 25%. He is also on isosorbide 60 mg daily. This was the medication he thought was the same as Effient. I'm going to decrease his isosorbide to 30 mg a day. He was orthostatic with blood pressures from 105/68-97/66. I would expect a low blood pressure with his ejection fraction that he is on initially symptomatic with significant lethargy and fatigue. I will see him again in one week with the medication changes to evaluate his status.  3. Ischemic cardiopathy: As stated above, ejection fraction 25%. He will continue on carvedilol 3.125 mg twice a day, lisinopril 5 mg daily, he may be a candidate for Entresto but will discuss this on follow-up visits as compliance issues remain a problem.  4. History of polysubstance abuse: I  discussed with him the need to stop using cocaine immediately. With his coronary artery disease and use of beta blockers this is extremely dangerous. He verbalizes understanding.   Current medicines are reviewed at length with the patient today.    Labs/ tests ordered today include:   Orders Placed This Encounter  Procedures  . EKG 12-Lead  Disposition:   FU with one week.   Signed, Jory Sims, NP  09/24/2015 3:55 PM    [facility] [address], [place], [place] [address] Phone: [phone]; Fax: [phone]

## 2015-09-24 NOTE — Patient Instructions (Addendum)
Your physician recommends that you schedule a follow-up appointment in: 1 Week with Jory Sims, NP.   Your physician has recommended you make the following change in your medication:   Effient 10 mg Daily  Decrease Imdur to 30 mg Daily  Continue Coreg 3.125 mg Two Times Daily  If you need a refill on your cardiac medications before your next appointment, please call your pharmacy.  Thank you for choosing [facility]!

## 2015-10-01 ENCOUNTER — Ambulatory Visit: Payer: Self-pay | Admitting: Adult Health

## 2015-10-08 ENCOUNTER — Ambulatory Visit: Payer: Self-pay | Admitting: Adult Health

## 2015-10-08 NOTE — Progress Notes (Deleted)
Cardiology Office Note   Date:  10/08/2015   ID:  [name], [name] 12-03-1955, MRN [id number]  PCP:  [name], MD  Cardiologist: [name], NP   No chief complaint on file.     History of Present Illness: [name] is a 60 y.o. male who presents for ongoing assessment and management of CAD with history of anterior ST elevation MI in 2015, complains of dizziness where he was admitted to [facility] in September 2017, with elevated troponin.  He had cardiac catheterization and successful stenting to his mid RCA, due to 99% mid RCA stenosis. The LAD stent was patent but had mid in-stent restenosis. There was distal LAD disease. He was found to have severe left ventricular systolic function with EF of 25% 35%. Near akinesis of the apex with severe hypokinesis of the mid anterior and mid inferior walls.. The patient did have nonsustained SVT during hospitalization.  On last office visit he came with generalized fatigue and slight dizziness. The patient stopped taking Effient as he was confused thinking it was the same medicine as isosorbide which she was already taking. The patient unfortunately continues to use cocaine.  On last office visit the patient medications were gone over in detail, explanations of each medication were given. I spoke with his wife who prepares his medicine. We restart Effient, isosorbide was decreased to 30 mg a day as he was feeling dizzy and lightheaded. Due to reduced ejection fraction I continued his carvedilol to 3.125 mg twice a day, continue lisinopril, and will consider Entresto on this visit. He continues to take medications as directed after his lengthy explanation. He again was advised on polysubstance abuse and immediate cessation of cocaine.   Past Medical History:  Diagnosis Date  . CAD (coronary artery disease)    STEMI-PCI   . CHF (congestive heart failure) ([place])   . Coronary artery disease   . Diabetes mellitus  without complication ([place])   . DM2 (diabetes mellitus, type 2) ([place])   . HTN (hypertension)   . Hyperlipemia   . Hyperlipidemia   . Hypertension   . MI, old   . Obesity   . Tobacco use     Past Surgical History:  Procedure Laterality Date  . CARDIAC CATHETERIZATION N/A 09/07/2015   Procedure: Left Heart Cath and Coronary Angiography;  Surgeon: Leonie Man, MD;  Location: [facility] LAB;  Service: Cardiovascular;  Laterality: N/A;  . CARDIAC CATHETERIZATION N/A 09/07/2015   Procedure: Coronary Stent Intervention;  Surgeon: Leonie Man, MD;  Location: [facility];  Service: Cardiovascular;  Laterality: N/A;  . CORONARY ANGIOGRAM  09/07/13   residual RCA and OM disease  . CORONARY ANGIOPLASTY WITH STENT PLACEMENT    . LEFT HEART CATH Bilateral 07/08/2012   Procedure: LEFT HEART CATH;  Surgeon: [name], MD;  Location: [facility] CATH LAB;  Service: Cardiovascular;  Laterality: Bilateral;  . LEFT HEART CATHETERIZATION WITH CORONARY ANGIOGRAM N/A 09/06/2013   STEMI, 2nd ISR LAD. Procedure: LEFT HEART CATHETERIZATION WITH CORONARY ANGIOGRAM;  Surgeon: [name], MD;  Location: [facility] CATH LAB;  Service: Cardiovascular;  Laterality: N/A;  . PERCUTANEOUS CORONARY STENT INTERVENTION (PCI-S)  07/08/2012   Procedure: PERCUTANEOUS CORONARY STENT INTERVENTION (PCI-S);  Surgeon: [name], MD;  Location: [facility] CATH LAB;  Service: Cardiovascular;;  DES LAD  . PERCUTANEOUS CORONARY STENT INTERVENTION (PCI-S) N/A 09/06/2013   Procedure: PERCUTANEOUS CORONARY STENT INTERVENTION (PCI-S);  Surgeon: [name], MD;  Location: [facility] CATH LAB;  Service: Cardiovascular;  Laterality: N/A;  Mid LAD 3.0/24mm Promus     Current Outpatient Prescriptions  Medication Sig Dispense Refill  . acetaminophen (TYLENOL) 325 MG tablet Take 2 tablets (650 mg total) by mouth every 4 (four) hours as needed for headache or mild pain.    Marland Kitchen aspirin EC 81 MG tablet Take 81 mg by mouth daily.      Marland Kitchen atorvastatin (LIPITOR) 80 MG tablet Take 80 mg by mouth at bedtime.    . carvedilol (COREG) 3.125 MG tablet Take 1 tablet (3.125 mg total) by mouth 2 (two) times daily with a meal. 60 tablet 11  . glipiZIDE (GLUCOTROL) 5 MG tablet Take 5 mg by mouth 2 (two) times daily.    . hydrocortisone cream 0.5 % Apply 1 application topically 2 (two) times daily.    . insulin glargine (LANTUS) 100 UNIT/ML injection Inject 25-35 Units into the skin at bedtime.     . isosorbide mononitrate (IMDUR) 30 MG 24 hr tablet Take 1 tablet (30 mg total) by mouth daily. 30 tablet 6  . lisinopril (PRINIVIL,ZESTRIL) 5 MG tablet Take 2.5 mg by mouth daily.     . metFORMIN (GLUCOPHAGE) 1000 MG tablet Take 1 tablet (1,000 mg total) by mouth 2 (two) times daily with a meal. Do not restart Metformin until Wednesday am 7/2 (Patient taking differently: Take 1,000 mg by mouth 2 (two) times daily. )    . nitroGLYCERIN (NITROSTAT) 0.4 MG SL tablet Place 1 tablet (0.4 mg total) under the tongue every 5 (five) minutes x 3 doses as needed for chest pain. 25 tablet 12  . prasugrel (EFFIENT) 10 MG TABS tablet Take 1 tablet (10 mg total) by mouth daily. 30 tablet 11  . traMADol (ULTRAM) 50 MG tablet Take 1-2 tablets (50-100 mg total) by mouth every 6 (six) hours as needed. 30 tablet 0  . Vitamin D, Ergocalciferol, (DRISDOL) 50000 units CAPS capsule Take 50,000 Units by mouth every 7 (seven) days.     No current facility-administered medications for this visit.     Allergies:   Review of patient's allergies indicates no known allergies.    Social History:  The patient  reports that he has been smoking Cigarettes.  He has been smoking about 0.50 packs per day. He has never used smokeless tobacco. He reports that he drinks alcohol. He reports that he uses drugs, including Cocaine.   Family History:  The patient's family history includes Diabetes in his father, mother, and sister; Hypertension in his father, mother, and sister.     ROS: All other systems are reviewed and negative. Unless otherwise mentioned in H&P    PHYSICAL EXAM: VS:  There were no vitals taken for this visit. , BMI There is no height or weight on file to calculate BMI. GEN: Well nourished, well developed, in no acute distress  HEENT: normal  Neck: no JVD, carotid bruits, or masses Cardiac: ***RRR; no murmurs, rubs, or gallops,no edema  Respiratory:  clear to auscultation bilaterally, normal work of breathing GI: soft, nontender, nondistended, + BS MS: no deformity or atrophy  Skin: warm and dry, no rash Neuro:  Strength and sensation are intact Psych: euthymic mood, full affect   EKG:  EKG {ACTION; IS/IS [id number] ordered today. The ekg ordered today demonstrates ***   Recent Labs: 09/04/2015: ALT 28 09/11/2015: BUN 16; Creatinine, Ser 1.24; Hemoglobin 13.4; Platelets 213; Potassium 4.1; Sodium 135    Lipid Panel    Component  Value Date/Time   CHOL 161 09/11/2015 0052   TRIG 174 (H) 09/11/2015 0052   HDL 25 (L) 09/11/2015 0052   CHOLHDL 6.4 09/11/2015 0052   VLDL 35 09/11/2015 0052   LDLCALC 101 (H) 09/11/2015 0052      Wt Readings from Last 3 Encounters:  09/24/15 240 lb (108.9 kg)  09/12/15 243 lb 1.6 oz (110.3 kg)  09/08/15 246 lb 4.1 oz (111.7 kg)      Other studies Reviewed: Additional studies/ records that were reviewed today include: ***. Review of the above records demonstrates: ***   ASSESSMENT AND PLAN:  1.  ***   Current medicines are reviewed at length with the patient today.    Labs/ tests ordered today include: *** No orders of the defined types were placed in this encounter.    Disposition:   FU with *** in {gen number [id number] {TIME; UNITS DAY/WEEK/MONTH:19136}   Signed, Jory Sims, NP  10/08/2015 7:16 AM    [facility] [address] [place], [place] [address] Phone: [phone]; Fax: [phone]

## 2015-11-25 ENCOUNTER — Emergency Department (HOSPITAL_COMMUNITY)
Admission: EM | Admit: 2015-11-25 | Discharge: 2015-11-25 | Discharge status: Against Medical Advice | Payer: Medicare Other | Attending: Emergency Medicine | Admitting: Emergency Medicine

## 2015-11-25 ENCOUNTER — Encounter (HOSPITAL_COMMUNITY): Payer: Self-pay | Admitting: *Deleted

## 2015-11-25 ENCOUNTER — Emergency Department (HOSPITAL_COMMUNITY): Payer: Medicare Other

## 2015-11-25 DIAGNOSIS — Z7984 Long term (current) use of oral hypoglycemic drugs: Secondary | ICD-10-CM | POA: Insufficient documentation

## 2015-11-25 DIAGNOSIS — R079 Chest pain, unspecified: Secondary | ICD-10-CM | POA: Insufficient documentation

## 2015-11-25 DIAGNOSIS — E1122 Type 2 diabetes mellitus with diabetic chronic kidney disease: Secondary | ICD-10-CM | POA: Insufficient documentation

## 2015-11-25 DIAGNOSIS — N183 Chronic kidney disease, stage 3 (moderate): Secondary | ICD-10-CM | POA: Insufficient documentation

## 2015-11-25 DIAGNOSIS — I5022 Chronic systolic (congestive) heart failure: Secondary | ICD-10-CM | POA: Diagnosis not present

## 2015-11-25 DIAGNOSIS — R531 Weakness: Secondary | ICD-10-CM | POA: Diagnosis not present

## 2015-11-25 DIAGNOSIS — R51 Headache: Secondary | ICD-10-CM | POA: Insufficient documentation

## 2015-11-25 DIAGNOSIS — Y999 Unspecified external cause status: Secondary | ICD-10-CM | POA: Diagnosis not present

## 2015-11-25 DIAGNOSIS — F1490 Cocaine use, unspecified, uncomplicated: Secondary | ICD-10-CM | POA: Insufficient documentation

## 2015-11-25 DIAGNOSIS — Z955 Presence of coronary angioplasty implant and graft: Secondary | ICD-10-CM | POA: Insufficient documentation

## 2015-11-25 DIAGNOSIS — J029 Acute pharyngitis, unspecified: Secondary | ICD-10-CM | POA: Insufficient documentation

## 2015-11-25 DIAGNOSIS — Z794 Long term (current) use of insulin: Secondary | ICD-10-CM | POA: Insufficient documentation

## 2015-11-25 DIAGNOSIS — M549 Dorsalgia, unspecified: Secondary | ICD-10-CM | POA: Diagnosis not present

## 2015-11-25 DIAGNOSIS — F17210 Nicotine dependence, cigarettes, uncomplicated: Secondary | ICD-10-CM | POA: Insufficient documentation

## 2015-11-25 DIAGNOSIS — I130 Hypertensive heart and chronic kidney disease with heart failure and stage 1 through stage 4 chronic kidney disease, or unspecified chronic kidney disease: Secondary | ICD-10-CM | POA: Insufficient documentation

## 2015-11-25 DIAGNOSIS — Z7982 Long term (current) use of aspirin: Secondary | ICD-10-CM | POA: Insufficient documentation

## 2015-11-25 DIAGNOSIS — R21 Rash and other nonspecific skin eruption: Secondary | ICD-10-CM | POA: Insufficient documentation

## 2015-11-25 DIAGNOSIS — I2510 Atherosclerotic heart disease of native coronary artery without angina pectoris: Secondary | ICD-10-CM | POA: Insufficient documentation

## 2015-11-25 DIAGNOSIS — R0602 Shortness of breath: Secondary | ICD-10-CM | POA: Diagnosis not present

## 2015-11-25 MED ORDER — IPRATROPIUM-ALBUTEROL 0.5-2.5 (3) MG/3ML IN SOLN: 3.0000 mL | Freq: Once | RESPIRATORY_TRACT | Status: DC

## 2015-11-25 MED ORDER — ALBUTEROL SULFATE HFA 108 (90 BASE) MCG/ACT IN AERS
2.0000 | INHALATION_SPRAY | Freq: Four times a day (QID) | RESPIRATORY_TRACT | Status: DC
  Administered 2015-11-25: 2 via RESPIRATORY_TRACT

## 2015-11-25 MED ORDER — ALBUTEROL SULFATE HFA 108 (90 BASE) MCG/ACT IN AERS
INHALATION_SPRAY | RESPIRATORY_TRACT | Status: AC
  Filled 2015-11-25: qty 6.7

## 2015-11-25 NOTE — ED Triage Notes (Signed)
Pt c/o sore throat and sob that started yesterday while cleaning and mixing chemicals together; pt states the chemicals were clorox and ammonia

## 2015-11-25 NOTE — Discharge Instructions (Signed)
Use albuterol inhaler 2 puffs every 6 hours for the next 7 days. Return for any new or worse symptoms. Today's chest x-ray was negative.

## 2015-11-25 NOTE — ED Provider Notes (Signed)
[name]   CSN: [id number] Arrival date & time: 11/25/15  [id number]  By signing my name below, I, Royce Macadamia, attest that this documentation has been prepared under the direction and in the presence of Fredia Sorrow, MD . Electronically Signed: Royce Macadamia, Scribe. 11/25/2015. 11:13 PM.  History   Chief Complaint Chief Complaint  Patient presents with  . Shortness of Breath   The history is provided by the patient and medical records. No language interpreter was used.    HPI Comments:  [name] is a 60 y.o. male who presents to the Emergency Department s/p mixing Ammonia and Clorox yesterday morning complaining of sudden onset sore throat and SOB.  His pain is worse with deep breaths.  No alleviating factors noted.  No additional symptoms or complaints noted.  Pt denies symptoms prior to mixing the chemicals. Pt does not use an inhaler at home.     Past Medical History:  Diagnosis Date  . CAD (coronary artery disease)    STEMI-PCI   . CHF (congestive heart failure) ([place])   . Coronary artery disease   . Diabetes mellitus without complication ([place])   . DM2 (diabetes mellitus, type 2) ([place])   . HTN (hypertension)   . Hyperlipemia   . Hyperlipidemia   . Hypertension   . MI, old   . Obesity   . Tobacco use     Patient Active Problem List   Diagnosis Date Noted  . NSVT (nonsustained ventricular tachycardia) ([place]) 09/12/2015  . Precordial chest pain 09/10/2015  . Cocaine use 09/04/2015  . Near syncope 06/29/2015  . Essential hypertension 06/29/2015  . History of heart artery stents 06/29/2015  . Hypovolemia 06/29/2015  . Insulin dependent diabetes mellitus ([place]) 06/29/2015  . History of MI (myocardial infarction) 06/29/2015  . Chronic systolic CHF [id number]  . Musculoskeletal chest pain 05/27/2015  . Rectal bleeding   . Stage 3 chronic renal impairment associated with type 2 diabetes mellitus ([place]) 09/09/2013  . Type 2 DM  with neuropathy and nephropathy 09/09/2013  . CAD- residual RCA and OM disease on re-look cath 09/07/13 09/09/2013  . Cardiomyopathy, ischemic-EF 40-45% by echo 09/07/13 09/09/2013  . S/P LAD DES June 2014 09/06/2013  . Tobacco user 09/06/2013  . Hyperglycemia 08/22/2012  . NSTEMI (non-ST elevated myocardial infarction) ([place]) 07/07/2012  . Dyslipidemia 07/07/2012  . Hypertensive heart disease     Past Surgical History:  Procedure Laterality Date  . CARDIAC CATHETERIZATION N/A 09/07/2015   Procedure: Left Heart Cath and Coronary Angiography;  Surgeon: Leonie Man, MD;  Location: [facility] LAB;  Service: Cardiovascular;  Laterality: N/A;  . CARDIAC CATHETERIZATION N/A 09/07/2015   Procedure: Coronary Stent Intervention;  Surgeon: Leonie Man, MD;  Location: [facility];  Service: Cardiovascular;  Laterality: N/A;  . CORONARY ANGIOGRAM  09/07/13   residual RCA and OM disease  . CORONARY ANGIOPLASTY WITH STENT PLACEMENT    . LEFT HEART CATH Bilateral 07/08/2012   Procedure: LEFT HEART CATH;  Surgeon: [name], MD;  Location: [facility] CATH LAB;  Service: Cardiovascular;  Laterality: Bilateral;  . LEFT HEART CATHETERIZATION WITH CORONARY ANGIOGRAM N/A 09/06/2013   STEMI, 2nd ISR LAD. Procedure: LEFT HEART CATHETERIZATION WITH CORONARY ANGIOGRAM;  Surgeon: [name], MD;  Location: [facility] CATH LAB;  Service: Cardiovascular;  Laterality: N/A;  . PERCUTANEOUS CORONARY STENT INTERVENTION (PCI-S)  07/08/2012   Procedure: PERCUTANEOUS CORONARY STENT INTERVENTION (PCI-S);  Surgeon: [name], MD;  Location:  [facility] CATH LAB;  Service: Cardiovascular;;  DES LAD  . PERCUTANEOUS CORONARY STENT INTERVENTION (PCI-S) N/A 09/06/2013   Procedure: PERCUTANEOUS CORONARY STENT INTERVENTION (PCI-S);  Surgeon: [name], MD;  Location: [facility] CATH LAB;  Service: Cardiovascular;  Laterality: N/A;  Mid LAD 3.0/24mm Promus       Home Medications    Prior to Admission  medications   Medication Sig Start Date End Date Taking? Authorizing Provider  acetaminophen (TYLENOL) 325 MG tablet Take 2 tablets (650 mg total) by mouth every 4 (four) hours as needed for headache or mild pain. 09/09/13   [name], PA-C  aspirin EC 81 MG tablet Take 81 mg by mouth daily.     Historical Provider, MD  atorvastatin (LIPITOR) 80 MG tablet Take 80 mg by mouth at bedtime.    Historical Provider, MD  carvedilol (COREG) 3.125 MG tablet Take 1 tablet (3.125 mg total) by mouth 2 (two) times daily with a meal. 09/12/15   Rhonda G Barrett, PA-C  glipiZIDE (GLUCOTROL) 5 MG tablet Take 5 mg by mouth 2 (two) times daily.    Historical Provider, MD  hydrocortisone cream 0.5 % Apply 1 application topically 2 (two) times daily.    Historical Provider, MD  insulin glargine (LANTUS) 100 UNIT/ML injection Inject 25-35 Units into the skin at bedtime.     Historical Provider, MD  isosorbide mononitrate (IMDUR) 30 MG 24 hr tablet Take 1 tablet (30 mg total) by mouth daily. 09/24/15 10/24/15  Lendon Colonel, NP  lisinopril (PRINIVIL,ZESTRIL) 5 MG tablet Take 2.5 mg by mouth daily.     Historical Provider, MD  metFORMIN (GLUCOPHAGE) 1000 MG tablet Take 1 tablet (1,000 mg total) by mouth 2 (two) times daily with a meal. Do not restart Metformin until Wednesday am 7/2 Patient taking differently: Take 1,000 mg by mouth 2 (two) times daily.  07/10/12   [name], MD  nitroGLYCERIN (NITROSTAT) 0.4 MG SL tablet Place 1 tablet (0.4 mg total) under the tongue every 5 (five) minutes x 3 doses as needed for chest pain. 05/03/14   Thurnell Lose, MD  prasugrel (EFFIENT) 10 MG TABS tablet Take 1 tablet (10 mg total) by mouth daily. 09/09/13   [name], PA-C  traMADol (ULTRAM) 50 MG tablet Take 1-2 tablets (50-100 mg total) by mouth every 6 (six) hours as needed. 09/12/15   Rhonda G Barrett, PA-C  Vitamin D, Ergocalciferol, (DRISDOL) 50000 units CAPS capsule Take 50,000 Units by mouth every 7 (seven) days.     Historical Provider, MD    Family History Family History  Problem Relation Age of Onset  . Hypertension Mother   . Diabetes Mother   . Hypertension Father   . Diabetes Father   . Hypertension Sister   . Diabetes Sister     Social History Social History  Substance Use Topics  . Smoking status: Current Every Day Smoker    Packs/day: 0.50    Types: Cigarettes  . Smokeless tobacco: Never Used  . Alcohol use Yes     Comment: occ     Allergies   Patient has no known allergies.   Review of Systems Review of Systems  Constitutional: Negative for chills and fever.  HENT: Positive for congestion, rhinorrhea and sore throat.   Eyes: Negative for visual disturbance.  Respiratory: Positive for cough and shortness of breath.   Cardiovascular: Positive for chest pain (Minor). Negative for leg swelling.  Gastrointestinal: Negative for abdominal pain, diarrhea, nausea and  vomiting.  Genitourinary: Negative for dysuria and hematuria.  Musculoskeletal: Positive for back pain.  Skin: Positive for rash.  Neurological: Positive for headaches.  Hematological: Does not bruise/bleed easily.  Psychiatric/Behavioral: Negative for confusion.     Physical Exam Updated Vital Signs BP 114/76   Pulse 70   Temp 97.8 F (36.6 C)   Resp 17   Ht 5\' 9"  (1.753 m)   Wt 250 lb (113.4 kg)   SpO2 100%   BMI 36.92 kg/m   Physical Exam  Constitutional: He is oriented to person, place, and time. He appears well-developed and well-nourished. No distress.  HENT:  Head: Normocephalic and atraumatic.  Nose: No mucosal edema.  Mouth/Throat: Oropharynx is clear and moist. No oropharyngeal exudate.  Some nasal erythema.  No exudate or edema.    Eyes: Conjunctivae and EOM are normal. Pupils are equal, round, and reactive to light. No scleral icterus.  Cardiovascular: Normal rate and regular rhythm.   Pulmonary/Chest: Effort normal and breath sounds normal. He has no wheezes. He has no rales.    Abdominal: Soft. Bowel sounds are normal. There is no tenderness.  Musculoskeletal: He exhibits no edema.  Neurological: He is alert and oriented to person, place, and time. No cranial nerve deficit or sensory deficit. He exhibits normal muscle tone. Coordination normal.  Skin: Skin is warm and dry.  Psychiatric: He has a normal mood and affect.  Nursing note and vitals reviewed.    ED Treatments / Results   DIAGNOSTIC STUDIES:  Oxygen Saturation is 100% on RA, NML by my interpretation.    COORDINATION OF CARE:  10:24 PM Informed pt fluid in the lungs is the primary concern from this chemical mix and would normally be seen in 24 hours.  Informed him is X-ray was clear. Discussed treatment plan with pt at bedside and pt agreed to plan.  Labs (all labs ordered are listed, but only abnormal results are displayed) Labs Reviewed - No data to display  EKG  EKG Interpretation None       Radiology Dg Chest 2 View  Result Date: 11/25/2015 CLINICAL DATA:  Shortness of breath and sore throat for 1 day. EXAM: CHEST  2 VIEW COMPARISON:  Most recent chest radiograph 09/10/2015. Lung bases from abdomen CT 05/01/2014 FINDINGS: Stable heart size and mediastinal contours, accentuated heart size by mediastinal fat pad. Coronary artery calcifications versus stent. No pulmonary edema. Streaky atelectasis or scarring in the mid and lower lung zone. No confluent airspace disease. No pleural effusion. Chronic widening of the acromioclavicular joints. IMPRESSION: Stable radiographic appearance of the chest.  No acute abnormality. Electronically Signed   By: Jeb Levering M.D.   On: 11/25/2015 20:12    Procedures Procedures (including critical care time)  Medications Ordered in ED Medications  ipratropium-albuterol (DUONEB) 0.5-2.5 (3) MG/3ML nebulizer solution 3 mL (3 mLs Nebulization Not Given 11/25/15 2306)  albuterol (PROVENTIL HFA;VENTOLIN HFA) 108 (90 Base) MCG/ACT inhaler 2 puff (2 puffs  Inhalation Given 11/25/15 2306)  albuterol (PROVENTIL HFA;VENTOLIN HFA) 108 (90 Base) MCG/ACT inhaler (not administered)     Initial Impression / Assessment and Plan / ED Course  I have reviewed the triage vital signs and the nursing notes.  Pertinent labs & imaging results that were available during my care of the patient were reviewed by me and considered in my medical decision making (see chart for details).  Clinical Course     Patient with exposure to chlorine gas after mixing ammonia and bleach. Patient with irritation  of the back of his throat fairly significant cough and feeling short of breath. Chest x-ray negative for any fluid on the lungs. This occurred yesterday morning with expect fluid accumulation by now. Oxygen saturations fine. Was planning on treating patient with albuterol Atrovent nebulizer in another was no wheezing. Patient insisted on leaving without treatment. Was able to talk patient into at least waiting for a prescription for albuterol inhaler and recommend he use it for 7 days. Patient signed out AMA.  Final Clinical Impressions(s) / ED Diagnoses   Final diagnoses:  SOB (shortness of breath)    New Prescriptions Discharge Medication List as of 11/25/2015 11:06 PM     I personally performed the services described in this documentation, which was scribed in my presence. The recorded information has been reviewed and is accurate.      Fredia Sorrow, MD 11/26/15 [phone]

## 2015-11-25 NOTE — ED Notes (Addendum)
Pt states he wants to leave- informed pt that he would be leaving AMA- Dr Rogene Houston made aware.

## 2016-01-18 ENCOUNTER — Emergency Department (HOSPITAL_COMMUNITY)
Admission: EM | Admit: 2016-01-18 | Discharge: 2016-01-18 | Disposition: A | Discharge status: Home / Self Care | Payer: Medicare Other | Attending: Emergency Medicine | Admitting: Emergency Medicine

## 2016-01-18 ENCOUNTER — Encounter (HOSPITAL_COMMUNITY): Payer: Self-pay | Admitting: Emergency Medicine

## 2016-01-18 DIAGNOSIS — F17210 Nicotine dependence, cigarettes, uncomplicated: Secondary | ICD-10-CM | POA: Diagnosis not present

## 2016-01-18 DIAGNOSIS — N183 Chronic kidney disease, stage 3 (moderate): Secondary | ICD-10-CM | POA: Insufficient documentation

## 2016-01-18 DIAGNOSIS — I509 Heart failure, unspecified: Secondary | ICD-10-CM | POA: Insufficient documentation

## 2016-01-18 DIAGNOSIS — I2510 Atherosclerotic heart disease of native coronary artery without angina pectoris: Secondary | ICD-10-CM | POA: Insufficient documentation

## 2016-01-18 DIAGNOSIS — Z794 Long term (current) use of insulin: Secondary | ICD-10-CM | POA: Diagnosis not present

## 2016-01-18 DIAGNOSIS — I129 Hypertensive chronic kidney disease with stage 1 through stage 4 chronic kidney disease, or unspecified chronic kidney disease: Secondary | ICD-10-CM | POA: Insufficient documentation

## 2016-01-18 DIAGNOSIS — Z7982 Long term (current) use of aspirin: Secondary | ICD-10-CM | POA: Insufficient documentation

## 2016-01-18 DIAGNOSIS — I252 Old myocardial infarction: Secondary | ICD-10-CM | POA: Insufficient documentation

## 2016-01-18 DIAGNOSIS — N492 Inflammatory disorders of scrotum: Secondary | ICD-10-CM | POA: Insufficient documentation

## 2016-01-18 DIAGNOSIS — E1122 Type 2 diabetes mellitus with diabetic chronic kidney disease: Secondary | ICD-10-CM | POA: Diagnosis not present

## 2016-01-18 MED ORDER — LIDOCAINE-EPINEPHRINE (PF) 2 %-1:200000 IJ SOLN
INTRAMUSCULAR | Status: AC
  Filled 2016-01-18: qty 20

## 2016-01-18 MED ORDER — TRAMADOL HCL 50 MG PO TABS: 50.0000 mg | ORAL_TABLET | Freq: Four times a day (QID) | ORAL | 0 refills | Status: DC | PRN

## 2016-01-18 MED ORDER — LIDOCAINE-EPINEPHRINE 1 %-1:100000 IJ SOLN: 10.0000 mL | Freq: Once | INTRAMUSCULAR | Status: DC

## 2016-01-18 MED ORDER — SULFAMETHOXAZOLE-TRIMETHOPRIM 800-160 MG PO TABS: 1.0000 | ORAL_TABLET | Freq: Two times a day (BID) | ORAL | 0 refills | Status: AC

## 2016-01-18 NOTE — ED Triage Notes (Signed)
PT c/o abscess to left side of scrotum with no drainage and multiple other small swollen tender areas to groin x3 days with no fever or drainage. PT states he has had some nausea as well.

## 2016-01-22 NOTE — ED Provider Notes (Signed)
[facility] DEPT Provider Note   CSN: [id number] Arrival date & time: 01/18/16  [id number]     History   Chief Complaint Chief Complaint  Patient presents with  . Abscess    HPI [name] is a 61 y.o. male.  HPI   61 year old male with a painful scrotal lesion. Onset about 3 days ago. Recently bigger and more painful. This tried taking sitz baths "to make it come to a head" but hasn't had any drainage. He has a couple smaller lesions in his groin as well. No fevers or chills. No urinary complaints.  Past Medical History:  Diagnosis Date  . CAD (coronary artery disease)    STEMI-PCI   . CHF (congestive heart failure) ([place])   . Coronary artery disease   . Diabetes mellitus without complication ([place])   . DM2 (diabetes mellitus, type 2) ([place])   . HTN (hypertension)   . Hyperlipemia   . Hyperlipidemia   . Hypertension   . MI, old   . Obesity   . Tobacco use     Patient Active Problem List   Diagnosis Date Noted  . NSVT (nonsustained ventricular tachycardia) ([place]) 09/12/2015  . Precordial chest pain 09/10/2015  . Cocaine use 09/04/2015  . Near syncope 06/29/2015  . Essential hypertension 06/29/2015  . History of heart artery stents 06/29/2015  . Hypovolemia 06/29/2015  . Insulin dependent diabetes mellitus ([place]) 06/29/2015  . History of MI (myocardial infarction) 06/29/2015  . Chronic systolic CHF [id number]  . Musculoskeletal chest pain 05/27/2015  . Rectal bleeding   . Stage 3 chronic renal impairment associated with type 2 diabetes mellitus ([place]) 09/09/2013  . 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 CAD- residual RCA and OM disease on re-look cath 09/07/13 09/09/2013  . Cardiomyopathy, ischemic-EF 40-45% by echo 09/07/13 09/09/2013  . S/P LAD DES June 2014 09/06/2013  . Tobacco user 09/06/2013  . Hyperglycemia 08/22/2012  . NSTEMI (non-ST elevated myocardial infarction) ([place]) 07/07/2012  . Dyslipidemia 07/07/2012  . Hypertensive heart  disease     Past Surgical History:  Procedure Laterality Date  . CARDIAC CATHETERIZATION N/A 09/07/2015   Procedure: Left Heart Cath and Coronary Angiography;  Surgeon: Leonie Man, MD;  Location: [facility];  Service: Cardiovascular;  Laterality: N/A;  . CARDIAC CATHETERIZATION N/A 09/07/2015   Procedure: Coronary Stent Intervention;  Surgeon: Leonie Man, MD;  Location: [facility];  Service: Cardiovascular;  Laterality: N/A;  . CORONARY ANGIOGRAM  09/07/13   residual RCA and OM disease  . CORONARY ANGIOPLASTY WITH STENT PLACEMENT    . LEFT HEART CATH Bilateral 07/08/2012   Procedure: LEFT HEART CATH;  Surgeon: [name], MD;  Location: [facility] CATH LAB;  Service: Cardiovascular;  Laterality: Bilateral;  . LEFT HEART CATHETERIZATION WITH CORONARY ANGIOGRAM N/A 09/06/2013   STEMI, 2nd ISR LAD. Procedure: LEFT HEART CATHETERIZATION WITH CORONARY ANGIOGRAM;  Surgeon: [name], MD;  Location: [facility] CATH LAB;  Service: Cardiovascular;  Laterality: N/A;  . PERCUTANEOUS CORONARY STENT INTERVENTION (PCI-S)  07/08/2012   Procedure: PERCUTANEOUS CORONARY STENT INTERVENTION (PCI-S);  Surgeon: [name], MD;  Location: [facility] CATH LAB;  Service: Cardiovascular;;  DES LAD  . PERCUTANEOUS CORONARY STENT INTERVENTION (PCI-S) N/A 09/06/2013   Procedure: PERCUTANEOUS CORONARY STENT INTERVENTION (PCI-S);  Surgeon: [name], MD;  Location: [facility] CATH LAB;  Service: Cardiovascular;  Laterality: N/A;  Mid LAD 3.0/24mm Promus       Home Medications  Prior to Admission medications   Medication Sig Start Date End Date Taking? Authorizing Provider  acetaminophen (TYLENOL) 325 MG tablet Take 2 tablets (650 mg total) by mouth every 4 (four) hours as needed for headache or mild pain. 09/09/13   [name], PA-C  aspirin EC 81 MG tablet Take 81 mg by mouth daily.     Historical Provider, MD  atorvastatin (LIPITOR) 80 MG tablet Take 80 mg by mouth at bedtime.     Historical Provider, MD  carvedilol (COREG) 3.125 MG tablet Take 1 tablet (3.125 mg total) by mouth 2 (two) times daily with a meal. 09/12/15   Rhonda G Barrett, PA-C  glipiZIDE (GLUCOTROL) 5 MG tablet Take 5 mg by mouth 2 (two) times daily.    Historical Provider, MD  hydrocortisone cream 0.5 % Apply 1 application topically 2 (two) times daily.    Historical Provider, MD  insulin glargine (LANTUS) 100 UNIT/ML injection Inject 25-35 Units into the skin at bedtime.     Historical Provider, MD  isosorbide mononitrate (IMDUR) 30 MG 24 hr tablet Take 1 tablet (30 mg total) by mouth daily. 09/24/15 10/24/15  Lendon Colonel, NP  lisinopril (PRINIVIL,ZESTRIL) 5 MG tablet Take 2.5 mg by mouth daily.     Historical Provider, MD  metFORMIN (GLUCOPHAGE) 1000 MG tablet Take 1 tablet (1,000 mg total) by mouth 2 (two) times daily with a meal. Do not restart Metformin until Wednesday am 7/2 Patient taking differently: Take 1,000 mg by mouth 2 (two) times daily.  07/10/12   [name], MD  nitroGLYCERIN (NITROSTAT) 0.4 MG SL tablet Place 1 tablet (0.4 mg total) under the tongue every 5 (five) minutes x 3 doses as needed for chest pain. 05/03/14   Thurnell Lose, MD  prasugrel (EFFIENT) 10 MG TABS tablet Take 1 tablet (10 mg total) by mouth daily. 09/09/13   [name], PA-C  sulfamethoxazole-trimethoprim (BACTRIM DS,SEPTRA DS) 800-160 MG tablet Take 1 tablet by mouth 2 (two) times daily. 01/18/16 01/25/16  Virgel Manifold, MD  traMADol (ULTRAM) 50 MG tablet Take 1 tablet (50 mg total) by mouth every 6 (six) hours as needed. 01/18/16   Virgel Manifold, MD  Vitamin D, Ergocalciferol, (DRISDOL) 50000 units CAPS capsule Take 50,000 Units by mouth every 7 (seven) days.    Historical Provider, MD    Family History Family History  Problem Relation Age of Onset  . Hypertension Mother   . Diabetes Mother   . Hypertension Father   . Diabetes Father   . Hypertension Sister   . Diabetes Sister     Social History Social  History  Substance Use Topics  . Smoking status: Current Every Day Smoker    Packs/day: 0.50    Types: Cigarettes  . Smokeless tobacco: Never Used  . Alcohol use Yes     Comment: occ     Allergies   Patient has no known allergies.   Review of Systems Review of Systems  All systems reviewed and negative, other than as noted in HPI.  Physical Exam Updated Vital Signs BP 124/99 (BP Location: Right Arm)   Pulse 88   Temp 97.9 F (36.6 C) (Oral)   Resp 16   Ht 5\' 9"  (1.753 m)   Wt 250 lb (113.4 kg)   SpO2 99%   BMI 36.92 kg/m   Physical Exam  Constitutional: He appears well-developed and well-nourished. No distress.  HENT:  Head: Normocephalic and atraumatic.  Eyes: Conjunctivae are normal. Right eye  exhibits no discharge. Left eye exhibits no discharge.  Neck: Neck supple.  Cardiovascular: Normal rate, regular rhythm and normal heart sounds.  Exam reveals no gallop and no friction rub.   No murmur heard. Pulmonary/Chest: Effort normal and breath sounds normal. No respiratory distress.  Abdominal: Soft. He exhibits no distension. There is no tenderness.  Genitourinary:  Genitourinary Comments: Near the midline of the scrotum patient has a fluctuant lesion consistent with an abscess. No drainage. Tender to palpation. No swelling cellulitis. Testicles are nontender. Abnormal lie. There is another indurated lesion on the scrotum and one on the mons. These are mildly tender to palpation. There is no fluctuance.  Musculoskeletal: He exhibits no edema or tenderness.  Neurological: He is alert.  Skin: Skin is warm and dry.  Psychiatric: He has a normal mood and affect. His behavior is normal. Thought content normal.  Nursing note and vitals reviewed.    ED Treatments / Results  Labs (all labs ordered are listed, but only abnormal results are displayed) Labs Reviewed - No data to display  EKG  EKG Interpretation None       Radiology No results  found.  Procedures Procedures (including critical care time)  INCISION AND DRAINAGE Performed by: Virgel Manifold Consent: Verbal consent obtained. Risks and benefits: risks, benefits and alternatives were discussed Type: abscess  Body area: scrotum  Anesthesia: local infiltration  Incision was made with a scalpel.  Local anesthetic: lidocaine 1% w/ epinephrine  Anesthetic total: 2 ml  Complexity: complex Blunt dissection to break up loculations  Drainage: purulent  Drainage amount: moderate  Packing material: none  Patient tolerance: Patient tolerated the procedure well with no immediate complications.    Medications Ordered in ED Medications - No data to display   Initial Impression / Assessment and Plan / ED Course  I have reviewed the triage vital signs and the nursing notes.  Pertinent labs & imaging results that were available during my care of the patient were reviewed by me and considered in my medical decision making (see chart for details).  Clinical Course     61 year old male with a small scrotal abscess without cellulitis. A couple other small spots which are indurated but did not seem like they have a drainable collection. Will place him on antibiotics. Continued wound care return precautions were discussed.  Final Clinical Impressions(s) / ED Diagnoses   Final diagnoses:  Scrotal abscess    New Prescriptions Discharge Medication List as of 01/18/2016  8:24 AM    START taking these medications   Details  sulfamethoxazole-trimethoprim (BACTRIM DS,SEPTRA DS) 800-160 MG tablet Take 1 tablet by mouth 2 (two) times daily., Starting Mon 01/18/2016, Until Mon 01/25/2016, Print         Virgel Manifold, MD 01/22/16 1650

## 2016-01-30 ENCOUNTER — Emergency Department (HOSPITAL_COMMUNITY): Payer: Medicare Other

## 2016-01-30 ENCOUNTER — Inpatient Hospital Stay (HOSPITAL_COMMUNITY)
Admission: EM | Admit: 2016-01-30 | Discharge: 2016-02-03 | DRG: 194 | Disposition: A | Discharge status: Home / Self Care | Payer: Medicare Other | Attending: Internal Medicine | Admitting: Internal Medicine

## 2016-01-30 ENCOUNTER — Encounter (HOSPITAL_COMMUNITY): Payer: Self-pay | Admitting: Emergency Medicine

## 2016-01-30 DIAGNOSIS — I2510 Atherosclerotic heart disease of native coronary artery without angina pectoris: Secondary | ICD-10-CM | POA: Diagnosis present

## 2016-01-30 DIAGNOSIS — Z833 Family history of diabetes mellitus: Secondary | ICD-10-CM | POA: Diagnosis not present

## 2016-01-30 DIAGNOSIS — R197 Diarrhea, unspecified: Secondary | ICD-10-CM | POA: Diagnosis not present

## 2016-01-30 DIAGNOSIS — E1121 Type 2 diabetes mellitus with diabetic nephropathy: Secondary | ICD-10-CM | POA: Diagnosis present

## 2016-01-30 DIAGNOSIS — R112 Nausea with vomiting, unspecified: Secondary | ICD-10-CM

## 2016-01-30 DIAGNOSIS — F17210 Nicotine dependence, cigarettes, uncomplicated: Secondary | ICD-10-CM | POA: Diagnosis present

## 2016-01-30 DIAGNOSIS — Z794 Long term (current) use of insulin: Secondary | ICD-10-CM

## 2016-01-30 DIAGNOSIS — E860 Dehydration: Secondary | ICD-10-CM

## 2016-01-30 DIAGNOSIS — Z8249 Family history of ischemic heart disease and other diseases of the circulatory system: Secondary | ICD-10-CM

## 2016-01-30 DIAGNOSIS — F17200 Nicotine dependence, unspecified, uncomplicated: Secondary | ICD-10-CM | POA: Diagnosis present

## 2016-01-30 DIAGNOSIS — Z9889 Other specified postprocedural states: Secondary | ICD-10-CM | POA: Diagnosis not present

## 2016-01-30 DIAGNOSIS — Z79899 Other long term (current) drug therapy: Secondary | ICD-10-CM | POA: Diagnosis not present

## 2016-01-30 DIAGNOSIS — Z955 Presence of coronary angioplasty implant and graft: Secondary | ICD-10-CM

## 2016-01-30 DIAGNOSIS — Z7982 Long term (current) use of aspirin: Secondary | ICD-10-CM | POA: Diagnosis not present

## 2016-01-30 DIAGNOSIS — I252 Old myocardial infarction: Secondary | ICD-10-CM

## 2016-01-30 DIAGNOSIS — J181 Lobar pneumonia, unspecified organism: Secondary | ICD-10-CM | POA: Diagnosis not present

## 2016-01-30 DIAGNOSIS — N183 Chronic kidney disease, stage 3 unspecified: Secondary | ICD-10-CM | POA: Diagnosis present

## 2016-01-30 DIAGNOSIS — I5022 Chronic systolic (congestive) heart failure: Secondary | ICD-10-CM | POA: Diagnosis not present

## 2016-01-30 DIAGNOSIS — J09X1 Influenza due to identified novel influenza A virus with pneumonia: Secondary | ICD-10-CM | POA: Diagnosis not present

## 2016-01-30 DIAGNOSIS — J189 Pneumonia, unspecified organism: Secondary | ICD-10-CM | POA: Diagnosis present

## 2016-01-30 DIAGNOSIS — Z6836 Body mass index (BMI) 36.0-36.9, adult: Secondary | ICD-10-CM

## 2016-01-30 DIAGNOSIS — E871 Hypo-osmolality and hyponatremia: Secondary | ICD-10-CM | POA: Diagnosis present

## 2016-01-30 DIAGNOSIS — E1122 Type 2 diabetes mellitus with diabetic chronic kidney disease: Secondary | ICD-10-CM | POA: Diagnosis present

## 2016-01-30 DIAGNOSIS — N179 Acute kidney failure, unspecified: Secondary | ICD-10-CM | POA: Diagnosis present

## 2016-01-30 DIAGNOSIS — R05 Cough: Secondary | ICD-10-CM

## 2016-01-30 DIAGNOSIS — I130 Hypertensive heart and chronic kidney disease with heart failure and stage 1 through stage 4 chronic kidney disease, or unspecified chronic kidney disease: Secondary | ICD-10-CM | POA: Diagnosis present

## 2016-01-30 DIAGNOSIS — E1140 Type 2 diabetes mellitus with diabetic neuropathy, unspecified: Secondary | ICD-10-CM | POA: Diagnosis present

## 2016-01-30 DIAGNOSIS — I5023 Acute on chronic systolic (congestive) heart failure: Secondary | ICD-10-CM | POA: Diagnosis present

## 2016-01-30 DIAGNOSIS — Z7984 Long term (current) use of oral hypoglycemic drugs: Secondary | ICD-10-CM | POA: Diagnosis not present

## 2016-01-30 DIAGNOSIS — I10 Essential (primary) hypertension: Secondary | ICD-10-CM | POA: Diagnosis present

## 2016-01-30 DIAGNOSIS — E669 Obesity, unspecified: Secondary | ICD-10-CM | POA: Diagnosis present

## 2016-01-30 DIAGNOSIS — J101 Influenza due to other identified influenza virus with other respiratory manifestations: Secondary | ICD-10-CM | POA: Diagnosis not present

## 2016-01-30 DIAGNOSIS — R059 Cough, unspecified: Secondary | ICD-10-CM

## 2016-01-30 DIAGNOSIS — E1149 Type 2 diabetes mellitus with other diabetic neurological complication: Secondary | ICD-10-CM

## 2016-01-30 DIAGNOSIS — E878 Other disorders of electrolyte and fluid balance, not elsewhere classified: Secondary | ICD-10-CM | POA: Diagnosis present

## 2016-01-30 LAB — COMPREHENSIVE METABOLIC PANEL
ALK PHOS: 83 U/L (ref 38–126)
ALT: 19 U/L (ref 17–63)
ANION GAP: 9 (ref 5–15)
AST: 29 U/L (ref 15–41)
Albumin: 3.8 g/dL (ref 3.5–5.0)
BILIRUBIN TOTAL: 0.5 mg/dL (ref 0.3–1.2)
BUN: 20 mg/dL (ref 6–20)
CALCIUM: 8.5 mg/dL — AB (ref 8.9–10.3)
CO2: 24 mmol/L (ref 22–32)
Chloride: 96 mmol/L — ABNORMAL LOW (ref 101–111)
Creatinine, Ser: 2.01 mg/dL — ABNORMAL HIGH (ref 0.61–1.24)
GFR calc non Af Amer: 34 mL/min — ABNORMAL LOW (ref >60)
GFR, EST AFRICAN AMERICAN: 39 mL/min — AB (ref >60)
Glucose, Bld: 166 mg/dL — ABNORMAL HIGH (ref 65–99)
Potassium: 4 mmol/L (ref 3.5–5.1)
SODIUM: 129 mmol/L — AB (ref 135–145)
TOTAL PROTEIN: 8.2 g/dL — AB (ref 6.5–8.1)

## 2016-01-30 LAB — CBC
HCT: 47.4 % (ref 39.0–52.0)
Hemoglobin: 15.9 g/dL (ref 13.0–17.0)
MCH: 25.7 pg — ABNORMAL LOW (ref 26.0–34.0)
MCHC: 33.5 g/dL (ref 30.0–36.0)
MCV: 76.7 fL — ABNORMAL LOW (ref 78.0–100.0)
Platelets: 238 10*3/uL (ref 150–400)
RBC: 6.18 MIL/uL — AB (ref 4.22–5.81)
RDW: 16.1 % — ABNORMAL HIGH (ref 11.5–15.5)
WBC: 8.3 10*3/uL (ref 4.0–10.5)

## 2016-01-30 LAB — I-STAT CG4 LACTIC ACID, ED: LACTIC ACID, VENOUS: 1.7 mmol/L (ref 0.5–1.9)

## 2016-01-30 LAB — LIPASE, BLOOD: Lipase: 20 U/L (ref 11–51)

## 2016-01-30 MED ORDER — DEXTROSE 5 % IV SOLN
500.0000 mg | INTRAVENOUS | Status: DC
  Administered 2016-01-30 – 2016-02-02 (×4): 500 mg via INTRAVENOUS
  Filled 2016-01-30 (×6): qty 500

## 2016-01-30 MED ORDER — DEXTROSE 5 % IV SOLN
1.0000 g | INTRAVENOUS | Status: DC
  Administered 2016-01-31 – 2016-02-02 (×3): 1 g via INTRAVENOUS
  Filled 2016-01-30 (×4): qty 10

## 2016-01-30 MED ORDER — METOCLOPRAMIDE HCL 5 MG/ML IJ SOLN
10.0000 mg | Freq: Once | INTRAMUSCULAR | Status: AC
  Administered 2016-01-30: 10 mg via INTRAVENOUS
  Filled 2016-01-30: qty 2

## 2016-01-30 MED ORDER — ONDANSETRON HCL 4 MG/2ML IJ SOLN
4.0000 mg | Freq: Once | INTRAMUSCULAR | Status: AC
  Administered 2016-01-30: 4 mg via INTRAVENOUS
  Filled 2016-01-30: qty 2

## 2016-01-30 MED ORDER — ATORVASTATIN CALCIUM 40 MG PO TABS
80.0000 mg | ORAL_TABLET | Freq: Every day | ORAL | Status: DC
  Administered 2016-01-30 – 2016-02-02 (×4): 80 mg via ORAL
  Filled 2016-01-30 (×4): qty 2

## 2016-01-30 MED ORDER — ASPIRIN EC 81 MG PO TBEC
81.0000 mg | DELAYED_RELEASE_TABLET | Freq: Every day | ORAL | Status: DC
  Administered 2016-01-31 – 2016-02-03 (×4): 81 mg via ORAL
  Filled 2016-01-30 (×5): qty 1

## 2016-01-30 MED ORDER — INSULIN ASPART 100 UNIT/ML ~~LOC~~ SOLN
0.0000 [IU] | Freq: Three times a day (TID) | SUBCUTANEOUS | Status: DC
  Administered 2016-01-31: 100 [IU] via SUBCUTANEOUS
  Administered 2016-01-31: 3 [IU] via SUBCUTANEOUS
  Administered 2016-02-01: 5 [IU] via SUBCUTANEOUS
  Administered 2016-02-01 (×2): 3 [IU] via SUBCUTANEOUS
  Administered 2016-02-02 (×2): 5 [IU] via SUBCUTANEOUS
  Administered 2016-02-02: 3 [IU] via SUBCUTANEOUS
  Administered 2016-02-03: 5 [IU] via SUBCUTANEOUS
  Administered 2016-02-03: 3 [IU] via SUBCUTANEOUS

## 2016-01-30 MED ORDER — INSULIN GLARGINE 100 UNIT/ML ~~LOC~~ SOLN
25.0000 [IU] | Freq: Every day | SUBCUTANEOUS | Status: DC
  Administered 2016-01-30 – 2016-02-02 (×4): 25 [IU] via SUBCUTANEOUS
  Filled 2016-01-30 (×5): qty 0.25

## 2016-01-30 MED ORDER — SODIUM CHLORIDE 0.9 % IV BOLUS (SEPSIS)
1000.0000 mL | Freq: Once | INTRAVENOUS | Status: AC
  Administered 2016-01-30: 1000 mL via INTRAVENOUS

## 2016-01-30 MED ORDER — SODIUM CHLORIDE 0.9 % IV SOLN: 3.0000 g | Freq: Once | INTRAVENOUS | Status: DC

## 2016-01-30 MED ORDER — PRASUGREL HCL 10 MG PO TABS
10.0000 mg | ORAL_TABLET | Freq: Every day | ORAL | Status: DC
  Administered 2016-01-31 – 2016-02-03 (×5): 10 mg via ORAL
  Filled 2016-01-30 (×6): qty 1

## 2016-01-30 MED ORDER — ONDANSETRON HCL 4 MG/2ML IJ SOLN
4.0000 mg | Freq: Once | INTRAMUSCULAR | Status: AC | PRN
  Administered 2016-01-30: 4 mg via INTRAVENOUS
  Filled 2016-01-30: qty 2

## 2016-01-30 MED ORDER — LOPERAMIDE HCL 2 MG PO CAPS: 2.0000 mg | ORAL_CAPSULE | ORAL | Status: DC | PRN

## 2016-01-30 MED ORDER — ONDANSETRON HCL 4 MG/2ML IJ SOLN: 4.0000 mg | Freq: Four times a day (QID) | INTRAMUSCULAR | Status: DC | PRN

## 2016-01-30 MED ORDER — TRAMADOL HCL 50 MG PO TABS
50.0000 mg | ORAL_TABLET | Freq: Four times a day (QID) | ORAL | Status: DC | PRN
  Administered 2016-01-31 – 2016-02-02 (×5): 50 mg via ORAL
  Filled 2016-01-30 (×6): qty 1

## 2016-01-30 MED ORDER — ENOXAPARIN SODIUM 40 MG/0.4ML ~~LOC~~ SOLN
40.0000 mg | SUBCUTANEOUS | Status: DC
  Administered 2016-01-30 – 2016-02-02 (×4): 40 mg via SUBCUTANEOUS
  Filled 2016-01-30 (×4): qty 0.4

## 2016-01-30 MED ORDER — DICYCLOMINE HCL 10 MG/ML IM SOLN
20.0000 mg | Freq: Once | INTRAMUSCULAR | Status: AC
  Administered 2016-01-30: 20 mg via INTRAMUSCULAR
  Filled 2016-01-30: qty 2

## 2016-01-30 NOTE — H&P (Signed)
History and Physical  [name] [id number] DOB: September 16, 1955 DOA: 01/30/2016  Referring physician: Dr Leonette Monarch, ED physician PCP: [name], MD  Outpatient Specialists:   Patient Coming From: Home  Chief Complaint: Cough, shortness of breath, fever  HPI: [name] is a 61 y.o. male with a history of history of coronary artery disease status post with PCI, chronic systolic heart failure with LVEF of 45-50% on echo August/2017, diabetes type 2 on insulin, hypertension, hyperlipidemia, obesity. Patient's wife is admitted to the hospital with pneumonia, he started having symptoms about 3 days ago with worsening cough, shortness of breath, fevers. He is gradually become worse with his symptoms. He has not eaten in a few days has had diarrhea with nausea and vomiting. Shortness of breath is worse with movement and ambulation and improved with rest. He becomes short of breath after 15-20 feet.  Emergency Department Course: Chest x-ray shows lingular pneumonia with a white count of 8.3. His creatinine is 2.01 which is increased from his baseline of 1.24. Lactic acid is normal. Due to some soft blood pressures, he was given 2-1/2 L of IV fluids with fairly good response.  Review of Systems:   Pt denies anyconstipation, abdominal pain, wheezing, palpitations, headache, vision changes, lightheadedness, dizziness, melena, rectal bleeding.  Review of systems are otherwise negative  Past Medical History:  Diagnosis Date  . CAD (coronary artery disease)    STEMI-PCI   . CHF (congestive heart failure) ([place])   . Coronary artery disease   . Diabetes mellitus without complication ([place])   . DM2 (diabetes mellitus, type 2) ([place])   . HTN (hypertension)   . Hyperlipemia   . Hyperlipidemia   . Hypertension   . MI, old   . Obesity   . Tobacco use    Past Surgical History:  Procedure Laterality Date  . CARDIAC CATHETERIZATION N/A 09/07/2015   Procedure: Left Heart Cath and Coronary  Angiography;  Surgeon: Leonie Man, MD;  Location: [facility];  Service: Cardiovascular;  Laterality: N/A;  . CARDIAC CATHETERIZATION N/A 09/07/2015   Procedure: Coronary Stent Intervention;  Surgeon: Leonie Man, MD;  Location: [facility];  Service: Cardiovascular;  Laterality: N/A;  . CORONARY ANGIOGRAM  09/07/13   residual RCA and OM disease  . CORONARY ANGIOPLASTY WITH STENT PLACEMENT    . LEFT HEART CATH Bilateral 07/08/2012   Procedure: LEFT HEART CATH;  Surgeon: [name], MD;  Location: [facility] CATH LAB;  Service: Cardiovascular;  Laterality: Bilateral;  . LEFT HEART CATHETERIZATION WITH CORONARY ANGIOGRAM N/A 09/06/2013   STEMI, 2nd ISR LAD. Procedure: LEFT HEART CATHETERIZATION WITH CORONARY ANGIOGRAM;  Surgeon: [name], MD;  Location: [facility] CATH LAB;  Service: Cardiovascular;  Laterality: N/A;  . PERCUTANEOUS CORONARY STENT INTERVENTION (PCI-S)  07/08/2012   Procedure: PERCUTANEOUS CORONARY STENT INTERVENTION (PCI-S);  Surgeon: [name], MD;  Location: [facility] CATH LAB;  Service: Cardiovascular;;  DES LAD  . PERCUTANEOUS CORONARY STENT INTERVENTION (PCI-S) N/A 09/06/2013   Procedure: PERCUTANEOUS CORONARY STENT INTERVENTION (PCI-S);  Surgeon: [name], MD;  Location: [facility] CATH LAB;  Service: Cardiovascular;  Laterality: N/A;  Mid LAD 3.0/24mm Promus   Social History:  reports that he has been smoking Cigarettes.  He has been smoking about 0.50 packs per day. He has never used smokeless tobacco. He reports that he drinks alcohol. He reports that he uses drugs, including Cocaine and Marijuana. Patient lives at Home  No Known Allergies  Family History  Problem Relation Age of Onset  . Hypertension Mother   . Diabetes Mother   . Hypertension Father   . Diabetes Father   . Hypertension Sister   . Diabetes Sister       Prior to Admission medications   Medication Sig Start Date End Date Taking? Authorizing Provider  acetaminophen  (TYLENOL) 325 MG tablet Take 2 tablets (650 mg total) by mouth every 4 (four) hours as needed for headache or mild pain. 09/09/13  Yes [name], PA-C  aspirin EC 81 MG tablet Take 81 mg by mouth daily.    Yes Historical Provider, MD  atorvastatin (LIPITOR) 80 MG tablet Take 80 mg by mouth at bedtime.   Yes Historical Provider, MD  carvedilol (COREG) 3.125 MG tablet Take 1 tablet (3.125 mg total) by mouth 2 (two) times daily with a meal. 09/12/15  Yes Rhonda G Barrett, PA-C  glipiZIDE (GLUCOTROL) 5 MG tablet Take 5 mg by mouth 2 (two) times daily.   Yes Historical Provider, MD  hydrocortisone cream 0.5 % Apply 1 application topically 2 (two) times daily.   Yes Historical Provider, MD  insulin glargine (LANTUS) 100 UNIT/ML injection Inject 25-35 Units into the skin at bedtime.    Yes Historical Provider, MD  isosorbide mononitrate (IMDUR) 30 MG 24 hr tablet Take 1 tablet (30 mg total) by mouth daily. 09/24/15 01/30/16 Yes Lendon Colonel, NP  lisinopril (PRINIVIL,ZESTRIL) 5 MG tablet Take 2.5 mg by mouth daily.    Yes Historical Provider, MD  metFORMIN (GLUCOPHAGE) 1000 MG tablet Take 1 tablet (1,000 mg total) by mouth 2 (two) times daily with a meal. Do not restart Metformin until Wednesday am 7/2 Patient taking differently: Take 1,000 mg by mouth 2 (two) times daily.  07/10/12  Yes [name], MD  nitroGLYCERIN (NITROSTAT) 0.4 MG SL tablet Place 1 tablet (0.4 mg total) under the tongue every 5 (five) minutes x 3 doses as needed for chest pain. 05/03/14  Yes Thurnell Lose, MD  prasugrel (EFFIENT) 10 MG TABS tablet Take 1 tablet (10 mg total) by mouth daily. 09/09/13  Yes [name], PA-C  traMADol (ULTRAM) 50 MG tablet Take 1 tablet (50 mg total) by mouth every 6 (six) hours as needed. 01/18/16  Yes Virgel Manifold, MD  Vitamin D, Ergocalciferol, (DRISDOL) 50000 units CAPS capsule Take 50,000 Units by mouth every 7 (seven) days.   Yes Historical Provider, MD    Physical Exam: BP 100/62   Pulse  67   Temp 99.1 F (37.3 C) (Oral)   Resp 24   Ht 5\' 9"  (1.753 m)   Wt 113.4 kg (250 lb)   SpO2 (!) 87%   BMI 36.92 kg/m   General: Elderly black male. Awake and alert and oriented x3. No acute cardiopulmonary distress.  HEENT: Normocephalic atraumatic.  Right and left ears normal in appearance.  Pupils equal, round, reactive to light. Extraocular muscles are intact. Sclerae anicteric and noninjected.  Moist mucosal membranes. No mucosal lesions.  Neck: Neck supple without lymphadenopathy. No carotid bruits. No masses palpated.  Cardiovascular: Regular rate with normal S1-S2 sounds. No murmurs, rubs, gallops auscultated. No JVD.  Respiratory: Good respiratory effort. Rales in the left midlung space. No accessory muscle use. Abdomen: Soft, nondistended. Mild tenderness in the lower quadrants bilaterally with no rebound or guarding. Active bowel sounds. No masses or hepatosplenomegaly  Skin: No rashes, lesions, or ulcerations.  Dry, warm to touch. 2+ dorsalis pedis and radial pulses. Musculoskeletal: No calf  or leg pain. All major joints not erythematous nontender.  No upper or lower joint deformation.  Good ROM.  No contractures  Psychiatric: Intact judgment and insight. Pleasant and cooperative. Neurologic: No focal neurological deficits. Strength is 5/5 and symmetric in upper and lower extremities.  Cranial nerves II through XII are grossly intact.           Labs on Admission: I have personally reviewed following labs and imaging studies  CBC:  Recent Labs Lab 01/30/16 1054  WBC 8.3  HGB 15.9  HCT 47.4  MCV 76.7*  PLT 99991111   Basic Metabolic Panel:  Recent Labs Lab 01/30/16 1054  NA 129*  K 4.0  CL 96*  CO2 24  GLUCOSE 166*  BUN 20  CREATININE 2.01*  CALCIUM 8.5*   GFR: Estimated Creatinine Clearance: 47.9 mL/min (by C-G formula based on SCr of 2.01 mg/dL (H)). Liver Function Tests:  Recent Labs Lab 01/30/16 1054  AST 29  ALT 19  ALKPHOS 83  BILITOT 0.5    PROT 8.2*  ALBUMIN 3.8    Recent Labs Lab 01/30/16 1054  LIPASE 20   No results for input(s): AMMONIA in the last 168 hours. Coagulation Profile: No results for input(s): INR, PROTIME in the last 168 hours. Cardiac Enzymes: No results for input(s): CKTOTAL, CKMB, CKMBINDEX, TROPONINI in the last 168 hours. BNP (last 3 results) No results for input(s): PROBNP in the last 8760 hours. HbA1C: No results for input(s): HGBA1C in the last 72 hours. CBG: No results for input(s): GLUCAP in the last 168 hours. Lipid Profile: No results for input(s): CHOL, HDL, LDLCALC, TRIG, CHOLHDL, LDLDIRECT in the last 72 hours. Thyroid Function Tests: No results for input(s): TSH, T4TOTAL, FREET4, T3FREE, THYROIDAB in the last 72 hours. Anemia Panel: No results for input(s): VITAMINB12, FOLATE, FERRITIN, TIBC, IRON, RETICCTPCT in the last 72 hours. Urine analysis:    Component Value Date/Time   COLORURINE YELLOW 09/05/2015 0958   APPEARANCEUR CLEAR 09/05/2015 0958   LABSPEC 1.034 (H) 09/05/2015 0958   PHURINE 5.0 09/05/2015 0958   GLUCOSEU >1000 (A) 09/05/2015 0958   HGBUR NEGATIVE 09/05/2015 0958   BILIRUBINUR NEGATIVE 09/05/2015 0958   KETONESUR NEGATIVE 09/05/2015 0958   PROTEINUR NEGATIVE 09/05/2015 0958   UROBILINOGEN 0.2 09/27/2014 2237   NITRITE NEGATIVE 09/05/2015 0958   LEUKOCYTESUR NEGATIVE 09/05/2015 0958   Sepsis Labs: @LABRCNTIP (procalcitonin:4,lacticidven:4) )No results found for this or any previous visit (from the past 240 hour(s)).   Radiological Exams on Admission: Dg Chest 2 View  Result Date: 01/30/2016 CLINICAL DATA:  Cough.  Nausea vomiting and diarrhea. EXAM: CHEST  2 VIEW COMPARISON:  11/25/2015 FINDINGS: Asymmetric opacity within the left lower lung zone is identified which is worrisome for pneumonia within the lingula. Right lung clear. No pleural effusion identified. Go six IMPRESSION: 1. Lingular pneumonia. Electronically Signed   By: Kerby Moors M.D.   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  We also discussed care given by the ED provider.  #1 Community acquired pneumonia Admit Antibiotics: Rocephin and Zithromax Robitussin Blood cultures drawn in the emergency department Sputum cultures CBC tomorrow Strep antigen by urine  Influenza screen #2 acute renal insufficiency on stage III chronic kidney disease  Recheck creatinine tomorrow #3 Essential hypertension  Hold blood pressure medication for now as blood pressures are soft #4 chronic systolic heart failure   We'll be cautious with fluid hydration #5 Type 2 diabetes  Hold metformin and glyburide as patient not eating current and patient has acute renal insufficiency  Continue Lantus  CBGs before meals and daily at bedtime #6 Diarrhea  Imodium  DVT prophylaxis: Lovenox Consultants: None Code Status: Full code Family Communication: None  Disposition Plan: Patient should be able to return home following admission   Truett Mainland, DO [facility] Pager [phone]  If 7PM-7AM, please contact night-coverage [email or web address] Password [email or web address]

## 2016-01-30 NOTE — ED Notes (Addendum)
Pt was unable to tolerate ginger ale. Pt vomiting. MD notified.

## 2016-01-30 NOTE — ED Provider Notes (Signed)
[place] DEPT Provider Note   CSN: [id number] Arrival date & time: 01/30/16  1024  By signing my name below, I, Arianna Nassar, attest that this documentation has been prepared under the direction and in the presence of Fatima Blank, MD.  Electronically Signed: Julien Nordmann, ED Scribe. 01/30/16. 12:33 PM.    History   Chief Complaint Chief Complaint  Patient presents with  . Diarrhea   The history is provided by the patient. No language interpreter was used.   HPI Comments: [name] is a 61 y.o. male who has a PMhx of CAD, CHF, DMII, HLD, HTN, and MI presents to the Emergency Department complaining of gradual worsening, moderate, flu-like symptoms that began a few days ago. He expresses nausea, vomiting x 3 days (~ 3-4 x a day), diarrhea (~6-7 x a day), nasal congestion, rhinorrhea, generalized myalgias, productive cough that brings up white phlegm x 3 days, subjective fever, chills. He also notes intermittent, cramping, diffuse abdominal pain that modifies temporarily after having a bowel movement. He notes he is able to drink fluids but reports vomiting it back up after a short amount of time. Pt has been around positive sick contact who has similar symptoms. He denies hematemesis and blood in stool. Pt is a smoker of ~ 12 years.   PCP: [name], MD at the [facility]  Past Medical History:  Diagnosis Date  . CAD (coronary artery disease)    STEMI-PCI   . CHF (congestive heart failure) ([place])   . Coronary artery disease   . Diabetes mellitus without complication ([place])   . DM2 (diabetes mellitus, type 2) ([place])   . HTN (hypertension)   . Hyperlipemia   . Hyperlipidemia   . Hypertension   . MI, old   . Obesity   . Tobacco use     Patient Active Problem List   Diagnosis Date Noted  . CAP (community acquired pneumonia) 01/30/2016  . NSVT (nonsustained ventricular tachycardia) ([place]) 09/12/2015  . Precordial chest pain 09/10/2015  . Cocaine use 09/04/2015    . Near syncope 06/29/2015  . Essential hypertension 06/29/2015  . History of heart artery stents 06/29/2015  . Hypovolemia 06/29/2015  . Insulin dependent diabetes mellitus ([place]) 06/29/2015  . History of MI (myocardial infarction) 06/29/2015  . Chronic systolic CHF [id number]  . Musculoskeletal chest pain 05/27/2015  . Rectal bleeding   . Stage 3 chronic renal impairment associated with type 2 diabetes mellitus ([place]) 09/09/2013  . 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 CAD- residual RCA and OM disease on re-look cath 09/07/13 09/09/2013  . Cardiomyopathy, ischemic-EF 40-45% by echo 09/07/13 09/09/2013  . S/P LAD DES June 2014 09/06/2013  . Tobacco user 09/06/2013  . Hyperglycemia 08/22/2012  . NSTEMI (non-ST elevated myocardial infarction) ([place]) 07/07/2012  . Dyslipidemia 07/07/2012  . Hypertensive heart disease     Past Surgical History:  Procedure Laterality Date  . CARDIAC CATHETERIZATION N/A 09/07/2015   Procedure: Left Heart Cath and Coronary Angiography;  Surgeon: Leonie Man, MD;  Location: [place] CV LAB;  Service: Cardiovascular;  Laterality: N/A;  . CARDIAC CATHETERIZATION N/A 09/07/2015   Procedure: Coronary Stent Intervention;  Surgeon: Leonie Man, MD;  Location: [place] CV LAB;  Service: Cardiovascular;  Laterality: N/A;  . CORONARY ANGIOGRAM  09/07/13   residual RCA and OM disease  . CORONARY ANGIOPLASTY WITH STENT PLACEMENT    . LEFT HEART CATH Bilateral 07/08/2012   Procedure: LEFT HEART CATH;  Surgeon:  [name], MD;  Location: [facility] CATH LAB;  Service: Cardiovascular;  Laterality: Bilateral;  . LEFT HEART CATHETERIZATION WITH CORONARY ANGIOGRAM N/A 09/06/2013   STEMI, 2nd ISR LAD. Procedure: LEFT HEART CATHETERIZATION WITH CORONARY ANGIOGRAM;  Surgeon: [name], MD;  Location: [facility] CATH LAB;  Service: Cardiovascular;  Laterality: N/A;  . PERCUTANEOUS CORONARY STENT INTERVENTION (PCI-S)  07/08/2012   Procedure:  PERCUTANEOUS CORONARY STENT INTERVENTION (PCI-S);  Surgeon: [name], MD;  Location: [facility] CATH LAB;  Service: Cardiovascular;;  DES LAD  . PERCUTANEOUS CORONARY STENT INTERVENTION (PCI-S) N/A 09/06/2013   Procedure: PERCUTANEOUS CORONARY STENT INTERVENTION (PCI-S);  Surgeon: [name], MD;  Location: [facility] CATH LAB;  Service: Cardiovascular;  Laterality: N/A;  Mid LAD 3.0/24mm Promus       Home Medications    Prior to Admission medications   Medication Sig Start Date End Date Taking? Authorizing Provider  acetaminophen (TYLENOL) 325 MG tablet Take 2 tablets (650 mg total) by mouth every 4 (four) hours as needed for headache or mild pain. 09/09/13  Yes [name], PA-C  aspirin EC 81 MG tablet Take 81 mg by mouth daily.    Yes Historical Provider, MD  atorvastatin (LIPITOR) 80 MG tablet Take 80 mg by mouth at bedtime.   Yes Historical Provider, MD  carvedilol (COREG) 3.125 MG tablet Take 1 tablet (3.125 mg total) by mouth 2 (two) times daily with a meal. 09/12/15  Yes Rhonda G Barrett, PA-C  glipiZIDE (GLUCOTROL) 5 MG tablet Take 5 mg by mouth 2 (two) times daily.   Yes Historical Provider, MD  hydrocortisone cream 0.5 % Apply 1 application topically 2 (two) times daily.   Yes Historical Provider, MD  insulin glargine (LANTUS) 100 UNIT/ML injection Inject 25-35 Units into the skin at bedtime.    Yes Historical Provider, MD  isosorbide mononitrate (IMDUR) 30 MG 24 hr tablet Take 1 tablet (30 mg total) by mouth daily. 09/24/15 01/30/16 Yes Lendon Colonel, NP  lisinopril (PRINIVIL,ZESTRIL) 5 MG tablet Take 2.5 mg by mouth daily.    Yes Historical Provider, MD  metFORMIN (GLUCOPHAGE) 1000 MG tablet Take 1 tablet (1,000 mg total) by mouth 2 (two) times daily with a meal. Do not restart Metformin until Wednesday am 7/2 Patient taking differently: Take 1,000 mg by mouth 2 (two) times daily.  07/10/12  Yes [name], MD  nitroGLYCERIN (NITROSTAT) 0.4 MG SL tablet Place 1 tablet (0.4  mg total) under the tongue every 5 (five) minutes x 3 doses as needed for chest pain. 05/03/14  Yes Thurnell Lose, MD  prasugrel (EFFIENT) 10 MG TABS tablet Take 1 tablet (10 mg total) by mouth daily. 09/09/13  Yes [name], PA-C  traMADol (ULTRAM) 50 MG tablet Take 1 tablet (50 mg total) by mouth every 6 (six) hours as needed. 01/18/16  Yes Virgel Manifold, MD  Vitamin D, Ergocalciferol, (DRISDOL) 50000 units CAPS capsule Take 50,000 Units by mouth every 7 (seven) days.   Yes Historical Provider, MD    Family History Family History  Problem Relation Age of Onset  . Hypertension Mother   . Diabetes Mother   . Hypertension Father   . Diabetes Father   . Hypertension Sister   . Diabetes Sister     Social History Social History  Substance Use Topics  . Smoking status: Current Every Day Smoker    Packs/day: 0.50    Types: Cigarettes  . Smokeless tobacco: Never Used  . Alcohol use Yes  Comment: occ     Allergies   Patient has no known allergies.   Review of Systems Review of Systems  A complete 10 system review of systems was obtained and all systems are negative except as noted in the HPI and PMH.   Physical Exam Updated Vital Signs Initial vitals: BP 92/68 (BP Location: Left Arm)   Pulse 92   Temp 99.1 F (37.3 C) (Oral)   Resp 19   Ht 5\' 9"  (1.753 m)   Wt 250 lb (113.4 kg)   SpO2 99%   BMI 36.92 kg/m   Physical Exam  Constitutional: He is oriented to person, place, and time. He appears well-developed and well-nourished. No distress.  HENT:  Head: Normocephalic and atraumatic.  Nose: Nose normal.  Eyes: Conjunctivae and EOM are normal. Pupils are equal, round, and reactive to light. Right eye exhibits no discharge. Left eye exhibits no discharge. No scleral icterus.  Neck: Normal range of motion. Neck supple.  Cardiovascular: Normal rate and regular rhythm.  Exam reveals no gallop and no friction rub.   No murmur heard. Pulmonary/Chest: Effort normal and  breath sounds normal. No stridor. No respiratory distress. He has no rales.  Abdominal: Soft. He exhibits no distension. There is tenderness (discomfort) in the periumbilical area and suprapubic area. There is no rigidity, no rebound, no guarding, no tenderness at McBurney's point and negative Murphy's sign.  Musculoskeletal: He exhibits no edema or tenderness.  Neurological: He is alert and oriented to person, place, and time.  Skin: Skin is warm and dry. No rash noted. He is not diaphoretic. No erythema.  Psychiatric: He has a normal mood and affect.  Vitals reviewed.    ED Treatments / Results  DIAGNOSTIC STUDIES: Oxygen Saturation is 99% on RA, normal by my interpretation.  COORDINATION OF CARE:  12:27 PM Discussed treatment plan with pt at bedside and pt agreed to plan.  Labs (all labs ordered are listed, but only abnormal results are displayed) Labs Reviewed  COMPREHENSIVE METABOLIC PANEL - Abnormal; Notable for the following:       Result Value   Sodium 129 (*)    Chloride 96 (*)    Glucose, Bld 166 (*)    Creatinine, Ser 2.01 (*)    Calcium 8.5 (*)    Total Protein 8.2 (*)    GFR calc non Af Amer 34 (*)    GFR calc Af Amer 39 (*)    All other components within normal limits  CBC - Abnormal; Notable for the following:    RBC 6.18 (*)    MCV 76.7 (*)    MCH 25.7 (*)    RDW 16.1 (*)    All other components within normal limits  RESPIRATORY PANEL BY PCR  LIPASE, BLOOD  URINALYSIS, ROUTINE W REFLEX MICROSCOPIC  I-STAT CG4 LACTIC ACID, ED    EKG  EKG Interpretation None       Radiology Dg Chest 2 View  Result Date: 01/30/2016 CLINICAL DATA:  Cough.  Nausea vomiting and diarrhea. EXAM: CHEST  2 VIEW COMPARISON:  11/25/2015 FINDINGS: Asymmetric opacity within the left lower lung zone is identified which is worrisome for pneumonia within the lingula. Right lung clear. No pleural effusion identified. Go six IMPRESSION: 1. Lingular pneumonia. Electronically Signed    By: Kerby Moors M.D.   On: 01/30/2016 13:10    Procedures Procedures (including critical care time)  Medications Ordered in ED Medications  Ampicillin-Sulbactam (UNASYN) 3 g in sodium chloride 0.9 % 100  mL IVPB (not administered)  azithromycin (ZITHROMAX) 500 mg in dextrose 5 % 250 mL IVPB (500 mg Intravenous New Bag/Given 01/30/16 1549)  metoCLOPramide (REGLAN) injection 10 mg (not administered)  ondansetron (ZOFRAN) injection 4 mg (4 mg Intravenous Given 01/30/16 1146)  sodium chloride 0.9 % bolus 1,000 mL (0 mLs Intravenous Stopped 01/30/16 1505)  ondansetron (ZOFRAN) injection 4 mg (4 mg Intravenous Given 01/30/16 1424)  sodium chloride 0.9 % bolus 1,000 mL (1,000 mLs Intravenous New Bag/Given 01/30/16 1517)  dicyclomine (BENTYL) injection 20 mg (20 mg Intramuscular Given 01/30/16 1542)     Initial Impression / Assessment and Plan / ED Course  I have reviewed the triage vital signs and the nursing notes.  Pertinent labs & imaging results that were available during my care of the patient were reviewed by me and considered in my medical decision making (see chart for details).     Workup consistent with community-acquired lobar pneumonia. Patient started on empiric and bodyaches. Lactic acid obtained which was in normal limits - no evidence of sepsis. Patient does have soft blood pressures secondary to dehydration from diarrhea. Patient also with emesis here in the emergency department that is refractory to nausea meds. Labs consistent with AKI likely from dehydration as well. Patient provided with IV fluids. Requires admission for continued management.  Appreciate hospitalist admission  Final Clinical Impressions(s) / ED Diagnoses   Final diagnoses:  Cough  Community acquired pneumonia of left lower lobe of lung (HCC)  Nausea vomiting and diarrhea  Dehydration  AKI (acute kidney injury) ([place])    I personally performed the services described in this documentation, which was  scribed in my presence. The recorded information has been reviewed and is accurate.         Fatima Blank, MD 01/30/16 [phone]

## 2016-01-30 NOTE — ED Triage Notes (Signed)
PT states body aches, n/v/d, nasal pressure and congestion x4 days.

## 2016-01-31 DIAGNOSIS — N179 Acute kidney failure, unspecified: Secondary | ICD-10-CM

## 2016-01-31 DIAGNOSIS — J101 Influenza due to other identified influenza virus with other respiratory manifestations: Secondary | ICD-10-CM

## 2016-01-31 DIAGNOSIS — J181 Lobar pneumonia, unspecified organism: Secondary | ICD-10-CM

## 2016-01-31 LAB — BASIC METABOLIC PANEL
ANION GAP: 7 (ref 5–15)
BUN: 19 mg/dL (ref 6–20)
CO2: 25 mmol/L (ref 22–32)
Calcium: 8.2 mg/dL — ABNORMAL LOW (ref 8.9–10.3)
Chloride: 98 mmol/L — ABNORMAL LOW (ref 101–111)
Creatinine, Ser: 1.63 mg/dL — ABNORMAL HIGH (ref 0.61–1.24)
GFR calc Af Amer: 51 mL/min — ABNORMAL LOW (ref >60)
GFR calc non Af Amer: 44 mL/min — ABNORMAL LOW (ref >60)
GLUCOSE: 180 mg/dL — AB (ref 65–99)
POTASSIUM: 3.9 mmol/L (ref 3.5–5.1)
Sodium: 130 mmol/L — ABNORMAL LOW (ref 135–145)

## 2016-01-31 LAB — RESPIRATORY PANEL BY PCR
Adenovirus: NOT DETECTED
Bordetella pertussis: NOT DETECTED
CHLAMYDOPHILA PNEUMONIAE-RVPPCR: NOT DETECTED
CORONAVIRUS 229E-RVPPCR: NOT DETECTED
CORONAVIRUS HKU1-RVPPCR: NOT DETECTED
Coronavirus NL63: NOT DETECTED
Coronavirus OC43: NOT DETECTED
INFLUENZA B-RVPPCR: NOT DETECTED
Influenza A H3: DETECTED — AB
Metapneumovirus: NOT DETECTED
Mycoplasma pneumoniae: NOT DETECTED
Parainfluenza Virus 1: NOT DETECTED
Parainfluenza Virus 2: NOT DETECTED
Parainfluenza Virus 3: NOT DETECTED
Parainfluenza Virus 4: NOT DETECTED
RHINOVIRUS / ENTEROVIRUS - RVPPCR: NOT DETECTED
Respiratory Syncytial Virus: NOT DETECTED

## 2016-01-31 LAB — GLUCOSE, CAPILLARY
Glucose-Capillary: 203 mg/dL — ABNORMAL HIGH (ref 65–99)
Glucose-Capillary: 192 mg/dL — ABNORMAL HIGH (ref 65–99)
Glucose-Capillary: 211 mg/dL — ABNORMAL HIGH (ref 65–99)

## 2016-01-31 LAB — CBC
HCT: 45 % (ref 39.0–52.0)
Hemoglobin: 15.2 g/dL (ref 13.0–17.0)
MCH: 26.1 pg (ref 26.0–34.0)
MCHC: 33.8 g/dL (ref 30.0–36.0)
MCV: 77.3 fL — ABNORMAL LOW (ref 78.0–100.0)
Platelets: 200 K/uL (ref 150–400)
RBC: 5.82 MIL/uL — ABNORMAL HIGH (ref 4.22–5.81)
RDW: 16 % — ABNORMAL HIGH (ref 11.5–15.5)
WBC: 6.6 K/uL (ref 4.0–10.5)

## 2016-01-31 MED ORDER — ISOSORBIDE MONONITRATE ER 60 MG PO TB24
30.0000 mg | ORAL_TABLET | Freq: Every day | ORAL | Status: DC
  Administered 2016-01-31 – 2016-02-03 (×4): 30 mg via ORAL
  Filled 2016-01-31 (×4): qty 1

## 2016-01-31 MED ORDER — NITROGLYCERIN 0.4 MG SL SUBL: 0.4000 mg | SUBLINGUAL_TABLET | SUBLINGUAL | Status: DC | PRN

## 2016-01-31 MED ORDER — OSELTAMIVIR PHOSPHATE 30 MG PO CAPS
30.0000 mg | ORAL_CAPSULE | Freq: Two times a day (BID) | ORAL | Status: DC
  Administered 2016-01-31 – 2016-02-03 (×7): 30 mg via ORAL
  Filled 2016-01-31 (×12): qty 1

## 2016-01-31 NOTE — Progress Notes (Addendum)
PROGRESS NOTE  [name] [id number] DOB: July 28, 1955 DOA: 01/30/2016 PCP: [name], MD  Brief Narrative: 61 year old man presented with cough, shortness of breath, fever, vomiting, diarrhea. Admitted for pneumonia, acute kidney injury  Assessment/Plan 1. Lobar pneumonia (lingular) without hypoxia.  Appears ill and short of breath. Continue empiric antibiotics. 2. Influenza A  Start Tamiflu 3. Acute kidney injury superimposed on chronic kidney disease stage III.  Resolving with IV fluids 4. Chronic systolic heart failure  Appears well compensated 5. Type 2 diabetes mellitus  Fasting blood sugar 180  Sliding scale insulin 6. Tobacco use disorder 7. PMH CAD, hypertension, hyperlipidemia   Appears ill but not toxic. Continue empiric antibiotics and Tamiflu. He clearly will need 1-2 additional days in the hospital.  DVT prophylaxis: enoxaparin Code Status: full Family Communication: none Disposition Plan: home   Murray Hodgkins, MD  [facility] Direct contact: [phone] --[place]  --[email or web address]; password [email or web address]  7PM-7AM contact night coverage as above 01/31/2016, 12:02 PM  LOS: 1 day   Consultants:    Procedures:    Antimicrobials:  Azithromycin 1/20  Ceftriaxone 1/21  Tamiflu 1/21  Interval history/Subjective: Short of breath. Coughing. Feels poorly. Reports wife is ill at home.  Objective: Vitals:   01/30/16 1530 01/30/16 1800 01/30/16 1833 01/30/16 2300  BP: 100/62 127/85 111/71 99/60  Pulse: 67 87 81 88  Resp:   20 17  Temp:   99.4 F (37.4 C) 99.8 F (37.7 C)  TempSrc:   Oral Oral  SpO2: (!) 87% 100% 100% 93%  Weight:      Height:        Intake/Output Summary (Last 24 hours) at 01/31/16 1202 Last data filed at 01/30/16 1822  Gross per 24 hour  Intake             2250 ml  Output                0 ml  Net             2250 ml     Filed Weights   01/30/16 1029  Weight: 113.4 kg (250 lb)    Exam:    Constitutional: Appears ill but not toxic. Mildly uncomfortable. Calm. Respiratory: Coarse breath sounds. Fair air movement. Speaks in full sentences. Generally clear other than coarse breath sounds. Cardiovascular regular rate and rhythm. No murmur, rub or gallop. Psychiatric. Grossly normal mood and affect. Speech fluent and appropriate.  I have personally reviewed following labs and imaging studies:  Sodium 130, no significant change. BUN stable. Creatinine improved, 1.63.  CBC unremarkable   Blood cultures no growth thus far  Scheduled Meds: . aspirin EC  81 mg Oral Daily  . atorvastatin  80 mg Oral QHS  . azithromycin  500 mg Intravenous Q24H  . cefTRIAXone (ROCEPHIN)  IV  1 g Intravenous Q24H  . enoxaparin (LOVENOX) injection  40 mg Subcutaneous Q24H  . insulin aspart  0-15 Units Subcutaneous TID WC  . insulin glargine  25 Units Subcutaneous QHS  . isosorbide mononitrate  30 mg Oral Daily  . oseltamivir  30 mg Oral BID  . prasugrel  10 mg Oral Daily   Continuous Infusions:  Principal Problem:   Lobar pneumonia (HCC) Active Problems:   Tobacco use disorder   Stage 3 chronic renal impairment associated with type 2 diabetes mellitus (HCC)   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   Essential hypertension   Chronic systolic CHF   Diarrhea  AKI (acute kidney injury) ([place])   Influenza A   LOS: 1 day

## 2016-02-01 LAB — GLUCOSE, CAPILLARY
Glucose-Capillary: 174 mg/dL — ABNORMAL HIGH (ref 65–99)
Glucose-Capillary: 213 mg/dL — ABNORMAL HIGH (ref 65–99)
Glucose-Capillary: 191 mg/dL — ABNORMAL HIGH (ref 65–99)
Glucose-Capillary: 151 mg/dL — ABNORMAL HIGH (ref 65–99)

## 2016-02-01 MED ORDER — ACETAMINOPHEN 325 MG PO TABS
650.0000 mg | ORAL_TABLET | Freq: Four times a day (QID) | ORAL | Status: DC | PRN
  Administered 2016-02-01: 650 mg via ORAL
  Filled 2016-02-01: qty 2

## 2016-02-01 MED ORDER — DOCUSATE SODIUM 100 MG PO CAPS
100.0000 mg | ORAL_CAPSULE | Freq: Two times a day (BID) | ORAL | Status: DC
  Administered 2016-02-01 – 2016-02-02 (×4): 100 mg via ORAL
  Filled 2016-02-01 (×4): qty 1

## 2016-02-01 MED ORDER — POLYETHYLENE GLYCOL 3350 17 G PO PACK
17.0000 g | PACK | Freq: Two times a day (BID) | ORAL | Status: DC
  Administered 2016-02-01 – 2016-02-02 (×3): 17 g via ORAL
  Filled 2016-02-01 (×3): qty 1

## 2016-02-01 NOTE — Progress Notes (Signed)
PROGRESS NOTE  [name] [id number] DOB: July 26, 1955 DOA: 01/30/2016 PCP: [name], MD  Brief Narrative: 61 year old man presented with cough, shortness of breath, fever, vomiting, diarrhea. Admitted for pneumonia, acute kidney injury  Assessment/Plan 1. Lobar pneumonia (lingular) without hypoxia.  Remains ill. Continue current antibiotics. 2. Influenza A  Continue Tamiflu 3. Acute kidney injury superimposed on chronic kidney disease stage III.  Improved with IV fluids. 4. Chronic systolic heart failure  Appears stable 5. Type 2 diabetes mellitus  Blood sugars remain stable. Continue sliding scale insulin, Lantus. 6. Tobacco use disorder 7. PMH CAD, hypertension, hyperlipidemia   Stable without much clinical improvement. Continues to be tachypnic. Continue current treatment with antibiotics, Tamiflu. Repeat basic metabolic panel in the morning.  DVT prophylaxis: enoxaparin Code Status: full Family Communication: none Disposition Plan: home   Murray Hodgkins, MD  [facility] Direct contact: [phone] --[place] [place]  --[email or web address]; password [email or web address]  7PM-7AM contact night coverage as above 02/01/2016, 9:55 AM  LOS: 2 days   Consultants:    Procedures:    Antimicrobials:  Azithromycin 1/20  Ceftriaxone 1/21  Tamiflu 1/21  Interval history/Subjective: Remains short of breath and coughing. Eating okay.  Objective: Vitals:   01/31/16 0638 01/31/16 1413 01/31/16 2220 02/01/16 0633  BP: (!) 92/59 (!) 82/49 (!) 102/55 (!) 92/45  Pulse: 77 80 80 81  Resp: 15 16 20 17   Temp: 99.5 F (37.5 C) 100.1 F (37.8 C) 99.4 F (37.4 C) 99.7 F (37.6 C)  TempSrc:  Oral Oral Oral  SpO2: 100%   93%  Weight:      Height:        Intake/Output Summary (Last 24 hours) at 02/01/16 0955 Last data filed at 02/01/16 0700  Gross per 24 hour  Intake              900 ml  Output              500 ml  Net              400 ml     Filed  Weights   01/30/16 1029  Weight: 113.4 kg (250 lb)    Exam:  Constitutional. Appears ill but not toxic. Coughs frequently during examination. Calm.  Respiratory. Generally clear to auscultation without frank wheezes, rales or rhonchi. Tachypnea. Mild increased respiratory effort.  Cardiovascular. Regular rate and rhythm. No murmur, rub or gallop.  Psychiatric. Alert, speech fluent and clear.  I have personally reviewed following labs and imaging studies:  Blood sugars stable.  Blood cultures no growth to date  Scheduled Meds: . aspirin EC  81 mg Oral Daily  . atorvastatin  80 mg Oral QHS  . azithromycin  500 mg Intravenous Q24H  . cefTRIAXone (ROCEPHIN)  IV  1 g Intravenous Q24H  . docusate sodium  100 mg Oral BID  . enoxaparin (LOVENOX) injection  40 mg Subcutaneous Q24H  . insulin aspart  0-15 Units Subcutaneous TID WC  . insulin glargine  25 Units Subcutaneous QHS  . isosorbide mononitrate  30 mg Oral Daily  . oseltamivir  30 mg Oral BID  . 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

## 2016-02-02 LAB — GLUCOSE, CAPILLARY
GLUCOSE-CAPILLARY: 212 mg/dL — AB (ref 65–99)
Glucose-Capillary: 186 mg/dL — ABNORMAL HIGH (ref 65–99)
Glucose-Capillary: 223 mg/dL — ABNORMAL HIGH (ref 65–99)
Glucose-Capillary: 211 mg/dL — ABNORMAL HIGH (ref 65–99)

## 2016-02-02 LAB — BASIC METABOLIC PANEL
Anion gap: 10 (ref 5–15)
BUN: 16 mg/dL (ref 6–20)
CALCIUM: 8.1 mg/dL — AB (ref 8.9–10.3)
CO2: 22 mmol/L (ref 22–32)
CREATININE: 1.44 mg/dL — AB (ref 0.61–1.24)
Chloride: 96 mmol/L — ABNORMAL LOW (ref 101–111)
GFR calc non Af Amer: 51 mL/min — ABNORMAL LOW (ref >60)
GFR, EST AFRICAN AMERICAN: 59 mL/min — AB (ref >60)
Glucose, Bld: 226 mg/dL — ABNORMAL HIGH (ref 65–99)
Potassium: 3.5 mmol/L (ref 3.5–5.1)
Sodium: 128 mmol/L — ABNORMAL LOW (ref 135–145)

## 2016-02-02 MED ORDER — BISACODYL 10 MG RE SUPP
10.0000 mg | Freq: Every day | RECTAL | Status: DC | PRN
  Administered 2016-02-02: 10 mg via RECTAL
  Filled 2016-02-02: qty 1

## 2016-02-02 MED ORDER — SENNA 8.6 MG PO TABS
1.0000 | ORAL_TABLET | Freq: Every day | ORAL | Status: DC
  Administered 2016-02-02: 8.6 mg via ORAL
  Filled 2016-02-02: qty 1

## 2016-02-02 MED ORDER — SODIUM CHLORIDE 0.9 % IV SOLN
INTRAVENOUS | Status: AC
  Administered 2016-02-02: 08:00:00 via INTRAVENOUS

## 2016-02-02 MED ORDER — POLYETHYLENE GLYCOL 3350 17 G PO PACK
17.0000 g | PACK | Freq: Three times a day (TID) | ORAL | Status: DC
  Administered 2016-02-02 – 2016-02-03 (×3): 17 g via ORAL
  Filled 2016-02-02 (×3): qty 1

## 2016-02-02 NOTE — Progress Notes (Signed)
PROGRESS NOTE  AMMON FORESTIER [id number] DOB: 03/16/1955 DOA: 01/30/2016 PCP: [name], MD  Brief Narrative: 61 year old man presented with cough, shortness of breath, fever, vomiting, diarrhea. Admitted for pneumonia, acute kidney injury  Assessment/Plan 1. Lobar pneumonia (lingular) without hypoxia.  Slow to improve. Continue current antibiotics. 2. Influenza A. Slow to improve.  Continue Tamiflu. 3. Acute kidney injury superimposed on chronic kidney disease stage III. Dehydration with hyponatremia, hypochloremia secondary to poor oral intake.  Renal function improving. Continue IV fluids. Monitor volume status. No evidence of significant volume overload on exam. 4. Chronic systolic heart failure  Remains stable. 5. Type 2 diabetes mellitus  Blood sugars remain stable. Continue sliding scale insulin, Lantus. 6. Tobacco use disorder 7. PMH CAD, hypertension, hyperlipidemia   No significant change since yesterday. Hemodynamics and respiratory status stable but remains significantly dyspneic and easily fatigued. Continue antibiotics, Tamiflu, fluids. Repeat basic metabolic panel the morning.  DVT prophylaxis: enoxaparin Code Status: full Family Communication: none Disposition Plan: home   Murray Hodgkins, MD  [facility] Direct contact: [phone] --[place] [place]  --[email or web address]; password [email or web address]  7PM-7AM contact night coverage as above 02/02/2016, 2:13 PM  LOS: 3 days   Consultants:    Procedures:    Antimicrobials:  Azithromycin 1/20  Ceftriaxone 1/21  Tamiflu 1/21 >> 1/25  Interval history/Subjective: Remains short of breath, coughing. Does not really feel much better.  Objective: Vitals:   02/01/16 1354 02/01/16 2131 02/02/16 0623 02/02/16 1338  BP: 95/60 (!) 96/58 97/74 (!) 90/52  Pulse: 85 82 75 72  Resp: 20 20 20 20   Temp: (!) 100.5 F (38.1 C) 98.6 F (37 C) 98.5 F (36.9 C) 98.7 F (37.1 C)  TempSrc: Oral Oral Oral  Oral  SpO2: 94% 96% 97% 100%  Weight:      Height:        Intake/Output Summary (Last 24 hours) at 02/02/16 1413 Last data filed at 02/02/16 1339  Gross per 24 hour  Intake              720 ml  Output              450 ml  Net              270 ml     Filed Weights   01/30/16 1029  Weight: 113.4 kg (250 lb)    Exam:  Constitutional. Appears very tired, fatigued. Ill but not toxic. No significant change compared to yesterday.  Respiratory. Fair air movement, no frank wheezes, rales or rhonchi. Mild increased respiratory effort, moderate after coughing. Significant cough during examination.  Cardiovascular regular rate and rhythm. No murmur, rub or gallop.  Psychiatric. Grossly normal mood and affect. Speech fluent and appropriate.  I have personally reviewed following labs and imaging studies:  Blood sugars stable.  Sodium 128, chloride 96, BUN 16, creatinine continues to improve 1.44.  Blood cultures no growth thus far  Scheduled Meds: . aspirin EC  81 mg Oral Daily  . atorvastatin  80 mg Oral QHS  . azithromycin  500 mg Intravenous Q24H  . cefTRIAXone (ROCEPHIN)  IV  1 g Intravenous Q24H  . enoxaparin (LOVENOX) injection  40 mg Subcutaneous Q24H  . insulin aspart  0-15 Units Subcutaneous TID WC  . insulin glargine  25 Units Subcutaneous QHS  . isosorbide mononitrate  30 mg Oral Daily  . oseltamivir  30 mg Oral BID  . polyethylene glycol  17 g Oral TID  . prasugrel  10 mg Oral Daily  . senna  1 tablet Oral QHS   Continuous Infusions: . 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

## 2016-02-03 DIAGNOSIS — J181 Lobar pneumonia, unspecified organism: Secondary | ICD-10-CM

## 2016-02-03 LAB — BASIC METABOLIC PANEL
Anion gap: 9 (ref 5–15)
BUN: 12 mg/dL (ref 6–20)
CALCIUM: 8.3 mg/dL — AB (ref 8.9–10.3)
CO2: 27 mmol/L (ref 22–32)
Chloride: 99 mmol/L — ABNORMAL LOW (ref 101–111)
Creatinine, Ser: 1.23 mg/dL (ref 0.61–1.24)
GFR calc Af Amer: >60 mL/min (ref >60)
GLUCOSE: 207 mg/dL — AB (ref 65–99)
Potassium: 3.8 mmol/L (ref 3.5–5.1)
SODIUM: 135 mmol/L (ref 135–145)

## 2016-02-03 LAB — GLUCOSE, CAPILLARY
GLUCOSE-CAPILLARY: 184 mg/dL — AB (ref 65–99)
Glucose-Capillary: 226 mg/dL — ABNORMAL HIGH (ref 65–99)

## 2016-02-03 MED ORDER — OSELTAMIVIR PHOSPHATE 75 MG PO CAPS: 75.0000 mg | ORAL_CAPSULE | Freq: Two times a day (BID) | ORAL | 0 refills | Status: DC

## 2016-02-03 MED ORDER — AZITHROMYCIN 250 MG PO TABS
500.0000 mg | ORAL_TABLET | Freq: Every day | ORAL | Status: DC
  Administered 2016-02-03: 500 mg via ORAL
  Filled 2016-02-03: qty 2

## 2016-02-03 MED ORDER — AMOXICILLIN-POT CLAVULANATE 875-125 MG PO TABS: 1.0000 | ORAL_TABLET | Freq: Two times a day (BID) | ORAL | 0 refills | Status: DC

## 2016-02-03 MED ORDER — OSELTAMIVIR PHOSPHATE 75 MG PO CAPS: 75.0000 mg | ORAL_CAPSULE | Freq: Two times a day (BID) | ORAL | Status: DC

## 2016-02-03 NOTE — Progress Notes (Signed)
Patient states understanding of discharge instructions, prescriptions given 

## 2016-02-03 NOTE — Discharge Summary (Signed)
Physician Discharge Summary  NAVID FUDGE [id number] DOB: December 20, 1955 DOA: 01/30/2016  PCP: [name], MD  Admit date: 01/30/2016 Discharge date: 02/03/2016  Time spent: 45 minutes  Recommendations for Outpatient Follow-up:  -To be discharged home today. -Advised to follow-up with primary care provider in 2 weeks.   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 Nehemiah Settle on 1/20: CHAYIM VANWYHE is a 61 y.o. male with a history of history of coronary artery disease status post with PCI, chronic systolic heart failure with LVEF of 45-50% on echo August/2017, diabetes type 2 on insulin, hypertension, hyperlipidemia, obesity. Patient's wife is admitted to the hospital with pneumonia, he started having symptoms about 3 days ago with worsening cough, shortness of breath, fevers. He is gradually become worse with his symptoms. He has not eaten in a few days has had diarrhea with nausea and vomiting. Shortness of breath is worse with movement and ambulation and improved with rest. He becomes short of breath after 15-20 feet.  Emergency Department Course: Chest x-ray shows lingular pneumonia with a white count of 8.3. His creatinine is 2.01 which is increased from his baseline of 1.24. Lactic acid is normal. Due to some soft blood pressures, he was given 2-1/2 L of IV fluids with fairly good response.   Hospital Course:   1. Lobar pneumonia (lingular) without hypoxia.  Clinically improved. Continue current antibiotics. 2. Influenza A.  Clinically improved  Continue Tamiflu. 3. Acute kidney injury superimposed on chronic kidney disease stage III. Dehydration with hyponatremia, hypochloremia secondary to poor oral intake.  Renal function back at baseline at 1.23 at time of discharge. 4. Chronic systolic heart failure  Remains stable. 5. Type 2 diabetes mellitus  Blood sugars remain stable. Continue sliding scale insulin, Lantus. 6. Tobacco use disorder 7. PMH CAD, hypertension, hyperlipidemia    Plan is to discharge home today to complete course of Tamiflu and Augmentin for flu and pneumonia. Has no current oxygen requirements.   Procedures:  None   Consultations:  None  Discharge Instructions  Discharge Instructions    Diet - low sodium heart healthy    Complete by:  As directed    Increase activity slowly    Complete by:  As directed      Allergies as of 02/03/2016   No Known Allergies     Medication List    TAKE these medications   acetaminophen 325 MG tablet Commonly known as:  TYLENOL Take 2 tablets (650 mg total) by mouth every 4 (four) hours as needed for headache or mild pain.   amoxicillin-clavulanate 875-125 MG tablet Commonly known as:  AUGMENTIN Take 1 tablet by mouth 2 (two) times daily.   aspirin EC 81 MG tablet Take 81 mg by mouth daily.   atorvastatin 80 MG tablet Commonly known as:  LIPITOR Take 80 mg by mouth at bedtime.   carvedilol 3.125 MG tablet Commonly known as:  COREG Take 1 tablet (3.125 mg total) by mouth 2 (two) times  daily with a meal.   glipiZIDE 5 MG tablet Commonly known as:  GLUCOTROL Take 5 mg by mouth 2 (two) times daily.   hydrocortisone cream 0.5 % Apply 1 application topically 2 (two) times daily.   insulin glargine 100 UNIT/ML injection Commonly known as:  LANTUS Inject 25-35 Units into the skin at bedtime.   isosorbide mononitrate 30 MG 24 hr tablet Commonly known as:  IMDUR Take 1 tablet (30 mg total) by mouth daily.   lisinopril 5  MG tablet Commonly known as:  PRINIVIL,ZESTRIL Take 2.5 mg by mouth daily.   metFORMIN 1000 MG tablet Commonly known as:  GLUCOPHAGE Take 1 tablet (1,000 mg total) by mouth 2 (two) times daily with a meal. Do not restart Metformin until Wednesday am 7/2 What changed:  when to take this  additional instructions   nitroGLYCERIN 0.4 MG SL tablet Commonly known as:  NITROSTAT Place 1 tablet (0.4 mg total) under the tongue every 5 (five) minutes x 3 doses as needed for chest pain.   oseltamivir 75 MG capsule Commonly known as:  TAMIFLU Take 1 capsule (75 mg total) by mouth 2 (two) times daily.   prasugrel 10 MG Tabs tablet Commonly known as:  EFFIENT Take 1 tablet (10 mg total) by mouth daily.   traMADol 50 MG tablet Commonly known as:  ULTRAM Take 1 tablet (50 mg total) by mouth every 6 (six) hours as needed.   Vitamin D (Ergocalciferol) 50000 units Caps capsule Commonly known as:  DRISDOL Take 50,000 Units by mouth every 7 (seven) days.      No Known Allergies Follow-up Information    CASSADA, PEGGY, MD. Schedule an appointment as soon as possible for a visit in 2 week(s).   Specialty:  Nurse Practitioner Contact information: [place] [address] [phone]            The results of significant diagnostics from this hospitalization (including imaging, microbiology, ancillary and laboratory) are listed below for reference.    Significant Diagnostic Studies: Dg Chest 2 View  Result Date: 01/30/2016 CLINICAL DATA:  Cough.  Nausea vomiting and diarrhea. EXAM: CHEST  2 VIEW COMPARISON:  11/25/2015 FINDINGS: Asymmetric opacity within the left lower lung zone is identified which is worrisome for pneumonia within the lingula. Right lung clear. No pleural effusion identified. Go six IMPRESSION: 1. Lingular pneumonia. Electronically Signed   By: Kerby Moors M.D.   On: 01/30/2016 13:10    Microbiology: Recent Results (from the past 240 hour(s))    Respiratory Panel by PCR     Status: Abnormal   Collection Time: 01/30/16 12:52 PM  Result Value Ref Range Status   Adenovirus NOT DETECTED NOT DETECTED Final   Coronavirus 229E NOT DETECTED NOT DETECTED Final   Coronavirus HKU1 NOT DETECTED NOT DETECTED Final   Coronavirus NL63 NOT DETECTED NOT DETECTED Final   Coronavirus OC43 NOT DETECTED NOT DETECTED Final   Metapneumovirus NOT DETECTED NOT DETECTED Final   Rhinovirus / Enterovirus NOT DETECTED NOT DETECTED Final   Influenza A H3 DETECTED (A) NOT DETECTED Final   Influenza B NOT DETECTED NOT DETECTED Final   Parainfluenza Virus 1 NOT DETECTED NOT DETECTED Final   Parainfluenza Virus 2 NOT DETECTED NOT DETECTED Final   Parainfluenza Virus 3 NOT DETECTED NOT DETECTED Final   Parainfluenza Virus 4 NOT DETECTED NOT DETECTED Final   Respiratory Syncytial Virus NOT DETECTED NOT DETECTED Final   Bordetella pertussis NOT DETECTED NOT DETECTED Final   Chlamydophila  pneumoniae NOT DETECTED NOT DETECTED Final   Mycoplasma pneumoniae NOT DETECTED NOT DETECTED Final    Comment: Performed at [facility], [place] [address] [place], [place] [address]  Culture, blood (routine x 2)     Status: None (Preliminary result)   Collection Time: 01/30/16  6:07 PM  Result Value Ref Range Status   Specimen Description RIGHT ANTECUBITAL  Final   Special Requests BOTTLES DRAWN AEROBIC AND ANAEROBIC [facility] EACH  Final   Culture NO GROWTH 4 DAYS  Final   Report Status PENDING  Incomplete  Culture, blood (routine x 2)     Status: None (Preliminary result)   Collection Time: 01/30/16  6:16 PM  Result Value Ref Range Status   Specimen Description RIGHT ANTECUBITAL  Final   Special Requests BOTTLES DRAWN AEROBIC AND ANAEROBIC 6CC EACH  Final   Culture NO GROWTH 4 DAYS  Final   Report Status PENDING  Incomplete     Labs: Basic Metabolic Panel:  Recent Labs Lab 01/30/16 1054 01/31/16 0640 02/02/16 0422 02/03/16 0418  NA 129* 130* 128* 135  K 4.0 3.9  3.5 3.8  CL 96* 98* 96* 99*  CO2 24 25 22 27   GLUCOSE 166* 180* 226* 207*  BUN 20 19 16 12   CREATININE 2.01* 1.63* 1.44* 1.23  CALCIUM 8.5* 8.2* 8.1* 8.3*   Liver Function Tests:  Recent Labs Lab 01/30/16 1054  AST 29  ALT 19  ALKPHOS 83  BILITOT 0.5  PROT 8.2*  ALBUMIN 3.8    Recent Labs Lab 01/30/16 1054  LIPASE 20   No results for input(s): AMMONIA in the last 168 hours. CBC:  Recent Labs Lab 01/30/16 1054 01/31/16 0640  WBC 8.3 6.6  HGB 15.9 15.2  HCT 47.4 45.0  MCV 76.7* 77.3*  PLT 238 200   Cardiac Enzymes: No results for input(s): CKTOTAL, CKMB, CKMBINDEX, TROPONINI in the last 168 hours. BNP: BNP (last 3 results) No results for input(s): BNP in the last 8760 hours.  ProBNP (last 3 results) No results for input(s): PROBNP in the last 8760 hours.  CBG:  Recent Labs Lab 02/02/16 1126 02/02/16 1621 02/02/16 2147 02/03/16 0752 02/03/16 1121  GLUCAP 223* 211* 212* 184* 226*       Signed:  HERNANDEZ ACOSTA,Berenize Gatlin  [facility] Pager: [phone] 02/03/2016, 4:11 PM

## 2016-02-05 LAB — CULTURE, BLOOD (ROUTINE X 2)
CULTURE: NO GROWTH
Culture: NO GROWTH

## 2016-02-09 ENCOUNTER — Encounter (INDEPENDENT_AMBULATORY_CARE_PROVIDER_SITE_OTHER): Payer: Self-pay | Admitting: *Deleted

## 2016-04-22 ENCOUNTER — Encounter: Payer: Self-pay | Admitting: Adult Health

## 2016-04-22 ENCOUNTER — Ambulatory Visit (INDEPENDENT_AMBULATORY_CARE_PROVIDER_SITE_OTHER): Payer: Medicare Other | Admitting: Adult Health

## 2016-04-22 VITALS — BP 108/62 | HR 77 | Ht 69.0 in | Wt 234.0 lb

## 2016-04-22 DIAGNOSIS — F1910 Other psychoactive substance abuse, uncomplicated: Secondary | ICD-10-CM

## 2016-04-22 DIAGNOSIS — I43 Cardiomyopathy in diseases classified elsewhere: Secondary | ICD-10-CM | POA: Diagnosis not present

## 2016-04-22 DIAGNOSIS — I10 Essential (primary) hypertension: Secondary | ICD-10-CM | POA: Diagnosis not present

## 2016-04-22 DIAGNOSIS — I2510 Atherosclerotic heart disease of native coronary artery without angina pectoris: Secondary | ICD-10-CM

## 2016-04-22 NOTE — Patient Instructions (Signed)
Your physician recommends that you schedule a follow-up appointment in: 1 Month  Your physician recommends that you continue on your current medications as directed. Please refer to the Current Medication list given to you today.  If you need a refill on your cardiac medications before your next appointment, please call your pharmacy.  Thank you for choosing [facility]!    

## 2016-04-22 NOTE — Progress Notes (Signed)
Cardiology Office Note   Date:  04/22/2016   ID:  Paddy, Neis 1955-07-10, MRN [id number]  PCP:  [name], MD  Cardiologist: Velva Harman, NP   No chief complaint on file.     History of Present Illness: [name] is a 61 y.o. male who presents for ongoing assessment and management of CAD, with history of anterior ST elevation MI in 2015, history of near syncope, with most recent cardiac catheterization in September 2017 revealing 99% mid RCA stenosis, with successful drug-eluting stent placement.   The patient was last seen in the office in September 2017 post hospitalization with some confusion concerning his medication regimen which included Effient. He was started back on it as he thought that he was taking 2 of the same medication. He also has had a history of cocaine abuse. He was hospitalized in January 2008 in the setting of pneumonia. During that admission he was also found to have acute kidney injury, superimposed on chronic kidney disease type III.  He is here on follow-up, after being admitted to [facility] in [place]. He admitted himself there to the drug abuse unit in the setting of ongoing cocaine and crack abuse. He did not been taking any of his medications for 3-4 months prior to being admitted. He stated that in order to receive his medicines he had to go through rehabilitation via the [facility] hospital.  During admission to rehabilitation, the patient began to have chest discomfort and syncope. He was taken to the cardiac catheterization lab at the [facility] hospital and did undergo cardiac cath. The patient is a very poor historian and is not know what the catheterization showed, nor are there records to confirm the results. He was placed back on his cardiac medications prior to discharge which includes statin therapy, nitrates, metoprolol 25 mg twice a day, Brilinta 90 mg twice a day, and nitroglycerin sublingual.  He has been essentially bedridden since  returning from the [facility] that he is too weak to get up and walk, he states he is very weak, and is not motivated to undergo any rehabilitation or increased activity. The patient also has multiple somatic complaints. He continues to smoke. He is not adhering to a low-sodium low-cholesterol diet. He is unaware of his blood glucose at this time is he does not have a monitor,  He has not followed up with a appointed [place] physician for primary care. He does not appear motivated to take responsibility for his own health and be proactive concerning follow-up with primary care.   Past Medical History:  Diagnosis Date  . CAD (coronary artery disease)    STEMI-PCI   . CHF (congestive heart failure) ([place])   . Coronary artery disease   . Diabetes mellitus without complication ([place])   . DM2 (diabetes mellitus, type 2) ([place])   . HTN (hypertension)   . Hyperlipemia   . Hyperlipidemia   . Hypertension   . MI, old   . Obesity   . Tobacco use     Past Surgical History:  Procedure Laterality Date  . CARDIAC CATHETERIZATION N/A 09/07/2015   Procedure: Left Heart Cath and Coronary Angiography;  Surgeon: Leonie Man, MD;  Location: [facility];  Service: Cardiovascular;  Laterality: N/A;  . CARDIAC CATHETERIZATION N/A 09/07/2015   Procedure: Coronary Stent Intervention;  Surgeon: Leonie Man, MD;  Location: [facility];  Service: Cardiovascular;  Laterality: N/A;  . CORONARY ANGIOGRAM  09/07/13   residual RCA and OM disease  . CORONARY ANGIOPLASTY WITH STENT PLACEMENT    . LEFT HEART CATH Bilateral 07/08/2012   Procedure: LEFT HEART CATH;  Surgeon: [name], MD;  Location: [facility] CATH LAB;  Service: Cardiovascular;  Laterality: Bilateral;  . LEFT HEART CATHETERIZATION WITH CORONARY ANGIOGRAM N/A 09/06/2013   STEMI, 2nd ISR LAD. Procedure: LEFT HEART CATHETERIZATION WITH CORONARY ANGIOGRAM;  Surgeon: [name], MD;  Location: [facility] CATH LAB;  Service:  Cardiovascular;  Laterality: N/A;  . PERCUTANEOUS CORONARY STENT INTERVENTION (PCI-S)  07/08/2012   Procedure: PERCUTANEOUS CORONARY STENT INTERVENTION (PCI-S);  Surgeon: [name], MD;  [facility];  Service: Cardiovascular;;  DES LAD  . PERCUTANEOUS CORONARY STENT INTERVENTION (PCI-S) N/A 09/06/2013   Procedure: PERCUTANEOUS CORONARY STENT INTERVENTION (PCI-S);  Surgeon: [name], MD;  Location: [facility] CATH LAB;  Service: Cardiovascular;  Laterality: N/A;  Mid LAD 3.0/24mm Promus     Current Outpatient Prescriptions  Medication Sig Dispense Refill  . acetaminophen (TYLENOL) 325 MG tablet Take 2 tablets (650 mg total) by mouth every 4 (four) hours as needed for headache or mild pain.    Marland Kitchen aspirin EC 81 MG tablet Take 81 mg by mouth daily.     Marland Kitchen atorvastatin (LIPITOR) 80 MG tablet Take 80 mg by mouth at bedtime.    . diclofenac sodium (VOLTAREN) 1 % GEL Apply 4 g topically 4 (four) times daily.    Marland Kitchen docusate sodium (COLACE) 100 MG capsule Take 100 mg by mouth 2 (two) times daily.    Marland Kitchen glipiZIDE (GLUCOTROL) 5 MG tablet Take 5 mg by mouth 2 (two) times daily.    . hydrocortisone cream 0.5 % Apply 1 application topically 2 (two) times daily.    . insulin glargine (LANTUS) 100 UNIT/ML injection Inject 25-35 Units into the skin at bedtime.     . isosorbide mononitrate (IMDUR) 30 MG 24 hr tablet Take 1 tablet (30 mg total) by mouth daily. 30 tablet 6  . metFORMIN (GLUCOPHAGE) 1000 MG tablet Take 1 tablet (1,000 mg total) by mouth 2 (two) times daily with a meal. Do not restart Metformin until Wednesday am 7/2 (Patient taking differently: Take 1,000 mg by mouth 2 (two) times daily. )    . metoprolol tartrate (LOPRESSOR) 25 MG tablet Take 25 mg by mouth 2 (two) times daily.    . naloxone (NARCAN) nasal spray 4 mg/0.1 mL Place 1 spray into the nose.    . nitroGLYCERIN (NITROSTAT) 0.4 MG SL tablet Place 1 tablet (0.4 mg total) under the tongue every 5 (five) minutes x 3 doses as  needed for chest pain. 25 tablet 12  . ticagrelor (BRILINTA) 90 MG TABS tablet Take 90 mg by mouth 2 (two) times daily.    . traMADol (ULTRAM) 50 MG tablet Take 1 tablet (50 mg total) by mouth every 6 (six) hours as needed. 6 tablet 0  . Vitamin D, Ergocalciferol, (DRISDOL) 50000 units CAPS capsule Take 50,000 Units by mouth every 7 (seven) days.     No current facility-administered medications for this visit.     Allergies:   Patient has no known allergies.    Social History:  The patient  reports that he has been smoking Cigarettes.  He has been smoking about 0.50 packs per day. He has never used smokeless tobacco. He reports that he drinks alcohol. He reports that he uses drugs, including Cocaine and Marijuana.   Family History:  The patient's family  history includes Diabetes in his father, mother, and sister; Hypertension in his father, mother, and sister.    ROS: All other systems are reviewed and negative. Unless otherwise mentioned in H&P    PHYSICAL EXAM: VS:  BP 108/62   Pulse 77   Ht 5\' 9"  (1.753 m)   Wt 234 lb (106.1 kg)   SpO2 93%   BMI 34.56 kg/m  , BMI Body mass index is 34.56 kg/m. GEN: Well nourished, well developed, in no acute distress  HEENT: normal  Neck: no JVD, carotid bruits, or masses Cardiac: RRR; no murmurs, rubs, or gallops,no edema  Respiratory:  clear to auscultation bilaterally, normal work of breathing GI: soft, nontender, nondistended, + BS MS: no deformity or atrophy  Skin: warm and dry, no rash Neuro:  Strength and sensation are intact Psych: euthymic mood, flat affect   Recent Labs: 01/30/2016: ALT 19 01/31/2016: Hemoglobin 15.2; Platelets 200 02/03/2016: BUN 12; Creatinine, Ser 1.23; Potassium 3.8; Sodium 135    Lipid Panel    Component Value Date/Time   CHOL 161 09/11/2015 0052   TRIG 174 (H) 09/11/2015 0052   HDL 25 (L) 09/11/2015 0052   CHOLHDL 6.4 09/11/2015 0052   VLDL 35 09/11/2015 0052   LDLCALC 101 (H) 09/11/2015 0052       Wt Readings from Last 3 Encounters:  04/22/16 234 lb (106.1 kg)  01/30/16 250 lb (113.4 kg)  01/18/16 250 lb (113.4 kg)      Other studies Reviewed: Cardiac Cath 09/08/2015 Conclusion     Mid RCA lesion, 99 %stenosed. Culprit lesion  A STENT SYNERGY DES 3X28 drug eluting stent was successfully placed. Post intervention, there is a 0% residual stenosis.  Distal RCA at the bifurcation, 80% stenosis.Ost RPDA lesion, 70 %stenosed. - Similar to prior procedure  Ostial LAD ~30% followed by STENT in Prox LAD to Mid LAD Stent - 30 %stenosed. Dist LAD lesion, 65 %stenosed.  Ost 2nd Diag lesion, 80 %stenosed. Jailed by stent  1st Mrg-1 lesion, 60 %stenosed. 1st Mrg-2 lesion, 90 %stenosed. - Similar to prior procedure  There is severe left ventricular systolic dysfunction. The left ventricular ejection fraction is 25-35% by visual estimate. With near akinesis of the apex and severe hypokinesis of the mid anterior and mid inferior walls.  LV end diastolic pressure is severely elevated.  There is mild (2+) mitral regurgitation.  The patient's coronary artery disease has progressed with significant progression in the mid RCA now to 99% stenosis. The distal RCA and PDA lesions are stable. The OM1 lesions are stable. The LAD stent is patent but with mild in-stent restenosis. There is distal LAD disease.     ASSESSMENT AND PLAN:  1. Coronary artery disease: Patient reports that he had a cardiac catheterization at the [facility] in [place] one week ago. He has no idea what his results were, his wife who is with him was not available to talk to the cardiologist. He was placed back on cardiac medications, dual antiplatelet therapy, and medications were provided by the [facility].  We will request copies of his hospitalization, cardiac catheterization report, along with discharge summary. He is not at all interested in cardiac rehabilitation. He is not interested in adhering to a  specified diet. I'm spoken with him about taking responsibility for his health, but he has multiple excuses concerning his inability to adhere to a healthy regimen.  As a result of this, we'll likely manage him medically, with follow-up to be established with a  cardiologist in [place] office. This is a difficult patient and it will be challenging to help him to maintain his blood pressure and optimize his regimen.  2. Ischemic cardiopathy: Most recent documentation via catheterization in August 2017 revealed an ejection fraction of 35%. The patient's blood pressure today is optimal for reduced ejection fraction. He will continue on current medication regimen. Uncertain of recent labs as these will be requested from the [place] to help with management. I do not see that he is on an ACE inhibitor, likely from history of chronic kidney disease. He will remain on beta blocker for heart rate control.  3. Hypercholesterolemia: He remain on statin therapy. He is not adhering to heart healthy diet at this time. This was reinforced.  4. History of polysubstance abuse: He has undergone rehabilitation at the [facility]. [place] defer to the [facility] for ongoing management, and maintenance.  5. Diabetes: On oral antihyperglycemic medicines. He is to follow-up with the [place] primary care but is not yet had an appointment. He does not have a local PCP.   Current medicines are reviewed at length with the patient today.    Labs/ tests ordered today include:  No orders of the defined types were placed in this encounter.    Disposition:   FU with one month to be established with [facility] cardiologist.   Signed, Jory Sims, NP  04/22/2016 4:02 PM    [place]. [address] [place], [place] [address] Phone: [phone]; Fax: [phone]

## 2016-04-22 NOTE — Progress Notes (Signed)
Name: [name]    DOB: [birth year]  Age: 61 y.o.  MR#: [id number]       PCP:  [name], MD      Insurance: Payor: MEDICARE / Plan: MEDICARE PART A AND B / Product Type: *No Product type* /   CC:   No chief complaint on file.   VS Vitals:   04/22/16 1512  BP: 108/62  Pulse: 77  SpO2: 93%  Weight: 234 lb (106.1 kg)  Height: 5\' 9"  (1.753 m)    Weights Current Weight  04/22/16 234 lb (106.1 kg)  01/30/16 250 lb (113.4 kg)  01/18/16 250 lb (113.4 kg)    Blood Pressure  BP Readings from Last 3 Encounters:  04/22/16 108/62  02/03/16 99/64  01/18/16 124/99     Admit date:  (Not on file) Last encounter with RMR:  Visit date not found   Allergy Patient has no known allergies.  Current Outpatient Prescriptions  Medication Sig Dispense Refill  . acetaminophen (TYLENOL) 325 MG tablet Take 2 tablets (650 mg total) by mouth every 4 (four) hours as needed for headache or mild pain.    Marland Kitchen aspirin EC 81 MG tablet Take 81 mg by mouth daily.     Marland Kitchen atorvastatin (LIPITOR) 80 MG tablet Take 80 mg by mouth at bedtime.    . diclofenac sodium (VOLTAREN) 1 % GEL Apply 4 g topically 4 (four) times daily.    Marland Kitchen docusate sodium (COLACE) 100 MG capsule Take 100 mg by mouth 2 (two) times daily.    Marland Kitchen glipiZIDE (GLUCOTROL) 5 MG tablet Take 5 mg by mouth 2 (two) times daily.    . hydrocortisone cream 0.5 % Apply 1 application topically 2 (two) times daily.    . insulin glargine (LANTUS) 100 UNIT/ML injection Inject 25-35 Units into the skin at bedtime.     . isosorbide mononitrate (IMDUR) 30 MG 24 hr tablet Take 1 tablet (30 mg total) by mouth daily. 30 tablet 6  . metFORMIN (GLUCOPHAGE) 1000 MG tablet Take 1 tablet (1,000 mg total) by mouth 2 (two) times daily with a meal. Do not restart Metformin until Wednesday am 7/2 (Patient taking differently: Take 1,000 mg by mouth 2 (two) times daily. )    . metoprolol tartrate (LOPRESSOR) 25 MG tablet Take 25 mg by mouth 2 (two) times daily.    .  naloxone (NARCAN) nasal spray 4 mg/0.1 mL Place 1 spray into the nose.    . nitroGLYCERIN (NITROSTAT) 0.4 MG SL tablet Place 1 tablet (0.4 mg total) under the tongue every 5 (five) minutes x 3 doses as needed for chest pain. 25 tablet 12  . ticagrelor (BRILINTA) 90 MG TABS tablet Take 90 mg by mouth 2 (two) times daily.    . traMADol (ULTRAM) 50 MG tablet Take 1 tablet (50 mg total) by mouth every 6 (six) hours as needed. 6 tablet 0  . Vitamin D, Ergocalciferol, (DRISDOL) 50000 units CAPS capsule Take 50,000 Units by mouth every 7 (seven) days.     No current facility-administered medications for this visit.     Discontinued Meds:    Medications Discontinued During This Encounter  Medication Reason  . oseltamivir (TAMIFLU) 75 MG capsule Error  . carvedilol (COREG) 3.125 MG tablet Error  . prasugrel (EFFIENT) 10 MG TABS tablet Error  . amoxicillin-clavulanate (AUGMENTIN) 875-125 MG tablet Error  . lisinopril (PRINIVIL,ZESTRIL) 5 MG tablet Error    Patient Active Problem List   Diagnosis Date Noted  .  AKI (acute kidney injury) ([place]) 01/31/2016  . Lobar pneumonia ([place]) 01/31/2016  . Influenza A 01/31/2016  . Diarrhea 01/30/2016  . NSVT (nonsustained ventricular tachycardia) ([place]) 09/12/2015  . Precordial chest pain 09/10/2015  . Cocaine use 09/04/2015  . Near syncope 06/29/2015  . Essential hypertension 06/29/2015  . History of heart artery stents 06/29/2015  . Insulin dependent diabetes mellitus ([place]) 06/29/2015  . History of MI (myocardial infarction) 06/29/2015  . Chronic systolic CHF 17/40/8144  . Musculoskeletal chest pain 05/27/2015  . Rectal bleeding   . Stage 3 chronic renal impairment associated with type 2 diabetes mellitus ([place]) 09/09/2013  . 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 CAD- residual RCA and OM disease on re-look cath 09/07/13 09/09/2013  . Cardiomyopathy, ischemic-EF 40-45% by echo 09/07/13 09/09/2013  . S/P LAD DES June 2014 09/06/2013  .  Tobacco use disorder 09/06/2013  . Hyperglycemia 08/22/2012  . NSTEMI (non-ST elevated myocardial infarction) ([place]) 07/07/2012  . Dyslipidemia 07/07/2012  . Hypertensive heart disease     LABS    Component Value Date/Time   NA 135 02/03/2016 0418   NA 128 (L) 02/02/2016 0422   NA 130 (L) 01/31/2016 0640   K 3.8 02/03/2016 0418   K 3.5 02/02/2016 0422   K 3.9 01/31/2016 0640   CL 99 (L) 02/03/2016 0418   CL 96 (L) 02/02/2016 0422   CL 98 (L) 01/31/2016 0640   CO2 27 02/03/2016 0418   CO2 22 02/02/2016 0422   CO2 25 01/31/2016 0640   GLUCOSE 207 (H) 02/03/2016 0418   GLUCOSE 226 (H) 02/02/2016 0422   GLUCOSE 180 (H) 01/31/2016 0640   BUN 12 02/03/2016 0418   BUN 16 02/02/2016 0422   BUN 19 01/31/2016 0640   CREATININE 1.23 02/03/2016 0418   CREATININE 1.44 (H) 02/02/2016 0422   CREATININE 1.63 (H) 01/31/2016 0640   CALCIUM 8.3 (L) 02/03/2016 0418   CALCIUM 8.1 (L) 02/02/2016 0422   CALCIUM 8.2 (L) 01/31/2016 0640   GFRNONAA >60 02/03/2016 0418   GFRNONAA 51 (L) 02/02/2016 0422   GFRNONAA 44 (L) 01/31/2016 0640   GFRAA >60 02/03/2016 0418   GFRAA 59 (L) 02/02/2016 0422   GFRAA 51 (L) 01/31/2016 0640   CMP     Component Value Date/Time   NA 135 02/03/2016 0418   K 3.8 02/03/2016 0418   CL 99 (L) 02/03/2016 0418   CO2 27 02/03/2016 0418   GLUCOSE 207 (H) 02/03/2016 0418   BUN 12 02/03/2016 0418   CREATININE 1.23 02/03/2016 0418   CALCIUM 8.3 (L) 02/03/2016 0418   PROT 8.2 (H) 01/30/2016 1054   ALBUMIN 3.8 01/30/2016 1054   AST 29 01/30/2016 1054   ALT 19 01/30/2016 1054   ALKPHOS 83 01/30/2016 1054   BILITOT 0.5 01/30/2016 1054   GFRNONAA >60 02/03/2016 0418   GFRAA >60 02/03/2016 0418       Component Value Date/Time   WBC 6.6 01/31/2016 0640   WBC 8.3 01/30/2016 1054   WBC 10.4 09/11/2015 0547   HGB 15.2 01/31/2016 0640   HGB 15.9 01/30/2016 1054   HGB 13.4 09/11/2015 0547   HCT 45.0 01/31/2016 0640   HCT 47.4 01/30/2016 1054   HCT 41.8 09/11/2015  0547   MCV 77.3 (L) 01/31/2016 0640   MCV 76.7 (L) 01/30/2016 1054   MCV 80.5 09/11/2015 0547    Lipid Panel     Component Value Date/Time   CHOL 161 09/11/2015 0052   TRIG 174 (H)  09/11/2015 0052   HDL 25 (L) 09/11/2015 0052   CHOLHDL 6.4 09/11/2015 0052   VLDL 35 09/11/2015 0052   LDLCALC 101 (H) 09/11/2015 0052    ABG    Component Value Date/Time   TCO2 24 09/06/2013 1505     Lab Results  Component Value Date   TSH 4.880 (H) 07/14/2012   BNP (last 3 results) No results for input(s): BNP in the last 8760 hours.  ProBNP (last 3 results) No results for input(s): PROBNP in the last 8760 hours.  Cardiac Panel (last 3 results) No results for input(s): CKTOTAL, CKMB, TROPONINI, RELINDX in the last 72 hours.  Iron/TIBC/Ferritin/ %Sat No results found for: IRON, TIBC, FERRITIN, IRONPCTSAT   EKG Orders placed or performed in visit on 09/24/15  . EKG 12-Lead     Prior Assessment and Plan Problem List as of 04/22/2016 Reviewed: 09/24/2015  4:02 PM by Jory Sims, NP     Cardiovascular and Mediastinum   Hypertensive heart disease   CAD- residual RCA and OM disease on re-look cath 09/07/13   Essential hypertension   Chronic systolic CHF   NSTEMI (non-ST elevated myocardial infarction) ([place])   Cardiomyopathy, ischemic-EF 40-45% by echo 09/07/13   Near syncope   NSVT (nonsustained ventricular tachycardia) ([facility])     Respiratory   Lobar pneumonia ([place])   Influenza A     Digestive   Rectal bleeding     Endocrine   Stage 3 chronic renal impairment associated with type 2 diabetes mellitus ([place])   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

## 2016-05-03 ENCOUNTER — Encounter: Payer: Self-pay | Admitting: Adult Health

## 2016-05-03 NOTE — Progress Notes (Unsigned)
Received documentation from [facility] concerning recent admission and cardiac catheterization. This is also placed in the history portion of the chart.   Cardiac catheterization on 04/15/2016 revealed multivessel CAD, with multivessel disease with recommendations for medical management. Patent stents in PLAD and RCA. Diffuse distal LAD, OM2 and RPDA disease.elevated LVEDP, no lesion to suggest ACS.

## 2016-05-24 ENCOUNTER — Encounter: Payer: Self-pay | Admitting: Cardiovascular Disease

## 2016-05-24 ENCOUNTER — Ambulatory Visit (INDEPENDENT_AMBULATORY_CARE_PROVIDER_SITE_OTHER): Payer: Medicare Other | Admitting: Cardiovascular Disease

## 2016-05-24 VITALS — BP 130/80 | HR 74 | Ht 69.0 in | Wt 232.0 lb

## 2016-05-24 DIAGNOSIS — I25118 Atherosclerotic heart disease of native coronary artery with other forms of angina pectoris: Secondary | ICD-10-CM

## 2016-05-24 DIAGNOSIS — I209 Angina pectoris, unspecified: Secondary | ICD-10-CM | POA: Diagnosis not present

## 2016-05-24 DIAGNOSIS — Z713 Dietary counseling and surveillance: Secondary | ICD-10-CM

## 2016-05-24 DIAGNOSIS — E782 Mixed hyperlipidemia: Secondary | ICD-10-CM

## 2016-05-24 DIAGNOSIS — I255 Ischemic cardiomyopathy: Secondary | ICD-10-CM

## 2016-05-24 NOTE — Progress Notes (Signed)
SUBJECTIVE: The patient presents for follow-up of multivessel coronary artery disease. He has a history of prior LAD stent placement and underwent mid RCA drug-eluting stent placement on 09/07/15.  Echocardiogram 09/05/15: Mildly reduced left ventricular systolic function, LVEF 71-24%, hypokinesis of the apical, inferoapical, and anteroseptal walls. There was mild mitral regurgitation and moderate left atrial dilatation.  He apparently underwent repeat coronary angiography at the [facility] in [place], [place] on 04/15/16 which showed patent stents with diffuse CAD and recommended medical management.  He has a history of cocaine and crack abuse.  He denies exertional chest pain and has not had to use nitroglycerin recently. He says he does take Lipitor but does not take Brilinta or Plavix.  He has occasional exertional dyspnea primarily when overexerting himself while doing yard work. He tries to avoid the hot humid weather.  He tries to avoid fast foods. He does like to eat salt.   He denies leg swelling, orthopnea, and paroxysmal nocturnal dyspnea.    Review of Systems: As per "subjective", otherwise negative.  No Known Allergies  Current Outpatient Prescriptions  Medication Sig Dispense Refill  . acetaminophen (TYLENOL) 325 MG tablet Take 2 tablets (650 mg total) by mouth every 4 (four) hours as needed for headache or mild pain.    Marland Kitchen aspirin EC 81 MG tablet Take 81 mg by mouth daily.     . diclofenac sodium (VOLTAREN) 1 % GEL Apply 4 g topically 4 (four) times daily.    Marland Kitchen docusate sodium (COLACE) 100 MG capsule Take 100 mg by mouth 2 (two) times daily.    Marland Kitchen glipiZIDE (GLUCOTROL) 5 MG tablet Take 5 mg by mouth 2 (two) times daily.    . hydrocortisone cream 0.5 % Apply 1 application topically 2 (two) times daily.    . isosorbide mononitrate (IMDUR) 30 MG 24 hr tablet Take 1 tablet (30 mg total) by mouth daily. 30 tablet 6  . lisinopril (PRINIVIL,ZESTRIL) 2.5 MG tablet Take 2.5  mg by mouth daily.    . metFORMIN (GLUCOPHAGE) 1000 MG tablet Take 1 tablet (1,000 mg total) by mouth 2 (two) times daily with a meal. Do not restart Metformin until Wednesday am 7/2 (Patient taking differently: Take 1,000 mg by mouth 2 (two) times daily. )    . metoprolol succinate (TOPROL-XL) 25 MG 24 hr tablet Take 25 mg by mouth daily.    . nitroGLYCERIN (NITROSTAT) 0.4 MG SL tablet Place 1 tablet (0.4 mg total) under the tongue every 5 (five) minutes x 3 doses as needed for chest pain. 25 tablet 12  . ticagrelor (BRILINTA) 90 MG TABS tablet Take 90 mg by mouth 2 (two) times daily.    . Vitamin D, Ergocalciferol, (DRISDOL) 50000 units CAPS capsule Take 50,000 Units by mouth every 7 (seven) days.     No current facility-administered medications for this visit.     Past Medical History:  Diagnosis Date  . CAD (coronary artery disease) [id number]\[id number]   Cardiac cath at [facility]. # vessel CAD, with patent stent in the PLAD and RCA. Diffuse dLAD, OM2, and  RPDA disease. No aortic stenosis. Elevated LVEDP. No lesion to suggest ACS. Continue medical managment.   . CHF (congestive heart failure) ([place])   . Coronary artery disease   . Diabetes mellitus without complication ([place])   . DM2 (diabetes mellitus, type 2) ([place])   . HTN (hypertension)   . Hyperlipemia   . Hyperlipidemia   . Hypertension   .  MI, old   . Obesity   . Tobacco use     Past Surgical History:  Procedure Laterality Date  . CARDIAC CATHETERIZATION N/A 09/07/2015   Procedure: Left Heart Cath and Coronary Angiography;  Surgeon: Leonie Man, MD;  Location: [facility] CV LAB;  Service: Cardiovascular;  Laterality: N/A;  . CARDIAC CATHETERIZATION N/A 09/07/2015   Procedure: Coronary Stent Intervention;  Surgeon: Leonie Man, MD;  Location: [facility] CV LAB;  Service: Cardiovascular;  Laterality: N/A;  . CORONARY ANGIOGRAM  09/07/13   residual RCA and OM disease  . CORONARY ANGIOPLASTY WITH STENT PLACEMENT    . LEFT  HEART CATH Bilateral 07/08/2012   Procedure: LEFT HEART CATH;  Surgeon: [name], MD;  Location: [facility] CATH LAB;  Service: Cardiovascular;  Laterality: Bilateral;  . LEFT HEART CATHETERIZATION WITH CORONARY ANGIOGRAM N/A 09/06/2013   STEMI, 2nd ISR LAD. Procedure: LEFT HEART CATHETERIZATION WITH CORONARY ANGIOGRAM;  Surgeon: [name], MD;  Location: [facility] CATH LAB;  Service: Cardiovascular;  Laterality: N/A;  . PERCUTANEOUS CORONARY STENT INTERVENTION (PCI-S)  07/08/2012   Procedure: PERCUTANEOUS CORONARY STENT INTERVENTION (PCI-S);  Surgeon: [name], MD;  Location: [facility] CATH LAB;  Service: Cardiovascular;;  DES LAD  . PERCUTANEOUS CORONARY STENT INTERVENTION (PCI-S) N/A 09/06/2013   Procedure: PERCUTANEOUS CORONARY STENT INTERVENTION (PCI-S);  Surgeon: [name], MD;  Location: [facility] CATH LAB;  Service: Cardiovascular;  Laterality: N/A;  Mid LAD 3.0/24mm Promus    Social History   Social History  . Marital status: Divorced    Spouse name: N/A  . Number of children: N/A  . Years of education: N/A   Occupational History  . Not on file.   Social History Main Topics  . Smoking status: Current Every Day Smoker    Packs/day: 0.50    Types: Cigarettes  . Smokeless tobacco: Never Used  . Alcohol use Yes     Comment: occ  . Drug use: Yes    Types: Cocaine, Marijuana     Comment: Cocaine used x1 week ago  . Sexual activity: Yes   Other Topics Concern  . Not on file   Social History Narrative   ** Merged History Encounter **         Vitals:   05/24/16 1042  BP: 130/80  Pulse: 74  SpO2: 92%  Weight: 232 lb (105.2 kg)  Height: 5\' 9"  (1.753 m)    Wt Readings from Last 3 Encounters:  05/24/16 232 lb (105.2 kg)  04/22/16 234 lb (106.1 kg)  01/30/16 250 lb (113.4 kg)     PHYSICAL EXAM General: NAD HEENT: Normal. Neck: No JVD, no thyromegaly. Lungs: Clear to auscultation bilaterally with normal respiratory effort. CV: Nondisplaced PMI.   Regular rate and rhythm, normal S1/S2, no S3/S4, no murmur. No pretibial or periankle edema.  No carotid bruit.   Abdomen: Obese.  Neurologic: Alert and oriented.  Psych: Normal affect. Skin: Normal. Musculoskeletal: No gross deformities.    ECG: Most recent ECG reviewed.   Labs: Lab Results  Component Value Date/Time   K 3.8 02/03/2016 04:18 AM   BUN 12 02/03/2016 04:18 AM   CREATININE 1.23 02/03/2016 04:18 AM   ALT 19 01/30/2016 10:54 AM   TSH 4.880 (H) 07/14/2012 11:15 PM   HGB 15.2 01/31/2016 06:40 AM     Lipids: Lab Results  Component Value Date/Time   LDLCALC 101 (H) 09/11/2015 12:52 AM   CHOL 161 09/11/2015 12:52 AM   TRIG 174 (H)  09/11/2015 12:52 AM   HDL 25 (L) 09/11/2015 12:52 AM       ASSESSMENT AND PLAN: 1. Multivessel coronary artery disease with stents in the LAD and RCA: Continue aspirin, Imdur, metoprolol succinate, Lipitor, and Plavix (appears this was switched from Brilinta by [facility]. He has not been taking Plavix and I strongly encouraged him to do so).   2. Ischemic cardiomyopathy: No signs of heart failure. Does not require a diuretic at this time.  3. Dietary counseling: Encouraged to eat a diet low in sodium and saturated fats.  4. Hyperlipidemia: Continue Lipitor 80 mg.    Disposition: Follow up 6 months. It appears he also sees cardiologists at the [facility] in [place], [place] as well as the [facility] in [place], M.D., F.A.C.C.

## 2016-05-24 NOTE — Patient Instructions (Signed)
Your physician wants you to follow-up in: 6 months Dr Virgina Jock will receive a reminder letter in the mail two months in advance. If you don't receive a letter, please call our office to schedule the follow-up appointment.     Your physician recommends that you continue on your current medications as directed. Please refer to the Current Medication list given to you today.   Please take your Lipitor 80 mg at dinner and take your Plavix 75 mg daily     If you need a refill on your cardiac medications before your next appointment, please call your pharmacy.     Thank you for choosing [facility] !

## 2016-07-11 ENCOUNTER — Other Ambulatory Visit: Payer: Self-pay | Admitting: *Deleted

## 2016-07-11 NOTE — Patient Outreach (Signed)
[facility]) Care Management  07/11/2016  BRAINARD HIGHFILL [birth year] [id number]  EMMI-Prevent referral:  Telephone call to patient who was advised of reason for call & [facility] care management services. HIPPA verification received. Patient voices he receives medical services from [facility] hospital in [place]. States primary care provider is Dr. Anda Latina at [facility] in [place]. States he does not have primary care provider in [place]>. States he has Medicare A & B coverage.   Patient's primary care provider in not in [facility] network. Patient ineligible for [facility] care management services.   Plan: Case closure/will route to care management assistant.   Sherrin Daisy, RN BSN CCM Care Management Coordinator [facility] Care Management  [phone]   .

## 2016-07-29 ENCOUNTER — Encounter (HOSPITAL_COMMUNITY): Payer: Self-pay | Admitting: Emergency Medicine

## 2016-07-29 ENCOUNTER — Emergency Department (HOSPITAL_COMMUNITY)
Admission: EM | Admit: 2016-07-29 | Discharge: 2016-07-29 | Disposition: A | Discharge status: Home / Self Care | Payer: Medicare Other | Attending: Emergency Medicine | Admitting: Emergency Medicine

## 2016-07-29 DIAGNOSIS — E1140 Type 2 diabetes mellitus with diabetic neuropathy, unspecified: Secondary | ICD-10-CM | POA: Insufficient documentation

## 2016-07-29 DIAGNOSIS — I509 Heart failure, unspecified: Secondary | ICD-10-CM | POA: Insufficient documentation

## 2016-07-29 DIAGNOSIS — M545 Low back pain, unspecified: Secondary | ICD-10-CM

## 2016-07-29 DIAGNOSIS — I252 Old myocardial infarction: Secondary | ICD-10-CM | POA: Diagnosis not present

## 2016-07-29 DIAGNOSIS — Z7984 Long term (current) use of oral hypoglycemic drugs: Secondary | ICD-10-CM | POA: Insufficient documentation

## 2016-07-29 DIAGNOSIS — E1121 Type 2 diabetes mellitus with diabetic nephropathy: Secondary | ICD-10-CM | POA: Diagnosis not present

## 2016-07-29 DIAGNOSIS — Z79899 Other long term (current) drug therapy: Secondary | ICD-10-CM | POA: Diagnosis not present

## 2016-07-29 DIAGNOSIS — Z7982 Long term (current) use of aspirin: Secondary | ICD-10-CM | POA: Diagnosis not present

## 2016-07-29 DIAGNOSIS — F17210 Nicotine dependence, cigarettes, uncomplicated: Secondary | ICD-10-CM | POA: Diagnosis not present

## 2016-07-29 DIAGNOSIS — I2510 Atherosclerotic heart disease of native coronary artery without angina pectoris: Secondary | ICD-10-CM | POA: Diagnosis not present

## 2016-07-29 DIAGNOSIS — M25562 Pain in left knee: Secondary | ICD-10-CM | POA: Diagnosis not present

## 2016-07-29 DIAGNOSIS — Z7902 Long term (current) use of antithrombotics/antiplatelets: Secondary | ICD-10-CM | POA: Insufficient documentation

## 2016-07-29 DIAGNOSIS — N183 Chronic kidney disease, stage 3 (moderate): Secondary | ICD-10-CM | POA: Diagnosis not present

## 2016-07-29 DIAGNOSIS — I110 Hypertensive heart disease with heart failure: Secondary | ICD-10-CM | POA: Diagnosis not present

## 2016-07-29 DIAGNOSIS — E1122 Type 2 diabetes mellitus with diabetic chronic kidney disease: Secondary | ICD-10-CM | POA: Diagnosis not present

## 2016-07-29 LAB — CBG MONITORING, ED: GLUCOSE-CAPILLARY: 111 mg/dL — AB (ref 65–99)

## 2016-07-29 MED ORDER — HYDROCODONE-ACETAMINOPHEN 5-325 MG PO TABS: 2.0000 | ORAL_TABLET | ORAL | 0 refills | Status: DC | PRN

## 2016-07-29 MED ORDER — HYDROCODONE-ACETAMINOPHEN 5-325 MG PO TABS
1.0000 | ORAL_TABLET | Freq: Once | ORAL | Status: AC
  Administered 2016-07-29: 1 via ORAL
  Filled 2016-07-29: qty 1

## 2016-07-29 MED ORDER — METHOCARBAMOL 500 MG PO TABS
500.0000 mg | ORAL_TABLET | Freq: Once | ORAL | Status: AC
  Administered 2016-07-29: 500 mg via ORAL
  Filled 2016-07-29: qty 1

## 2016-07-29 MED ORDER — METHOCARBAMOL 500 MG PO TABS: 500.0000 mg | ORAL_TABLET | Freq: Two times a day (BID) | ORAL | 0 refills | Status: DC

## 2016-07-29 NOTE — ED Notes (Signed)
Pt states lower back pain & knee pain. Pt w/ hx of both back & knee problems. Was seen by chiropractor on Wed & back pain became worse yesterday. Pt denies any new injury or activity.

## 2016-07-29 NOTE — ED Notes (Signed)
Pt alert & oriented x4, stable gait. Patient given discharge instructions, paperwork & prescription(s). Patient informed not to drive, operate any equipment & handel any important documents 4 hours after taking pain medication. Patient  instructed to stop at the registration desk to finish any additional paperwork. Patient  verbalized understanding. Pt left department w/ no further questions. 

## 2016-07-29 NOTE — Discharge Instructions (Signed)
See your Physician for recheck.  °

## 2016-07-29 NOTE — ED Triage Notes (Signed)
Pt c/o lower back pain and left knee pain and denies any new injuries.

## 2016-07-30 NOTE — ED Provider Notes (Signed)
[facility] DEPT Provider Note   CSN: [id number] Arrival date & time: 07/29/16  1902     History   Chief Complaint Chief Complaint  Patient presents with  . Back Pain    HPI [name] is a 61 y.o. male.  The history is provided by the patient. No language interpreter was used.  Back Pain   This is a chronic problem. The current episode started more than 1 week ago. The problem occurs constantly. The problem has been gradually worsening. The pain is associated with no known injury. The pain is present in the lumbar spine. The pain does not radiate. The pain is moderate. The pain is the same all the time. He has tried nothing for the symptoms. The treatment provided no relief.  Pt reports he has chronic back pain.  Pt is followed by [facility]   Past Medical History:  Diagnosis Date  . CAD (coronary artery disease) 0\9\3235   Cardiac cath at [facility]. # vessel CAD, with patent stent in the PLAD and RCA. Diffuse dLAD, OM2, and  RPDA disease. No aortic stenosis. Elevated LVEDP. No lesion to suggest ACS. Continue medical managment.   . CHF (congestive heart failure) ([place])   . Coronary artery disease   . Diabetes mellitus without complication ([place])   . DM2 (diabetes mellitus, type 2) ([place])   . HTN (hypertension)   . Hyperlipemia   . Hyperlipidemia   . Hypertension   . MI, old   . Obesity   . Tobacco use     Patient Active Problem List   Diagnosis Date Noted  . AKI (acute kidney injury) ([place]) 01/31/2016  . Lobar pneumonia ([place]) 01/31/2016  . Influenza A 01/31/2016  . Diarrhea 01/30/2016  . NSVT (nonsustained ventricular tachycardia) ([place]) 09/12/2015  . Precordial chest pain 09/10/2015  . Cocaine use 09/04/2015  . Near syncope 06/29/2015  . Essential hypertension 06/29/2015  . History of heart artery stents 06/29/2015  . Insulin dependent diabetes mellitus ([place]) 06/29/2015  . History of MI (myocardial infarction) 06/29/2015  . Chronic systolic CHF  57/32/2025  . Musculoskeletal chest pain 05/27/2015  . Rectal bleeding   . Stage 3 chronic renal impairment associated with type 2 diabetes mellitus ([place]) 09/09/2013  . 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 CAD- residual RCA and OM disease on re-look cath 09/07/13 09/09/2013  . Cardiomyopathy, ischemic-EF 40-45% by echo 09/07/13 09/09/2013  . S/P LAD DES June 2014 09/06/2013  . Tobacco use disorder 09/06/2013  . Hyperglycemia 08/22/2012  . NSTEMI (non-ST elevated myocardial infarction) ([place]) 07/07/2012  . Dyslipidemia 07/07/2012  . Hypertensive heart disease     Past Surgical History:  Procedure Laterality Date  . CARDIAC CATHETERIZATION N/A 09/07/2015   Procedure: Left Heart Cath and Coronary Angiography;  Surgeon: Leonie Man, MD;  Location: [facility] CV LAB;  Service: Cardiovascular;  Laterality: N/A;  . CARDIAC CATHETERIZATION N/A 09/07/2015   Procedure: Coronary Stent Intervention;  Surgeon: Leonie Man, MD;  Location: [facility] CV LAB;  Service: Cardiovascular;  Laterality: N/A;  . CORONARY ANGIOGRAM  09/07/13   residual RCA and OM disease  . CORONARY ANGIOPLASTY WITH STENT PLACEMENT    . LEFT HEART CATH Bilateral 07/08/2012   Procedure: LEFT HEART CATH;  Surgeon: [name], MD;  Location: [facility] CATH LAB;  Service: Cardiovascular;  Laterality: Bilateral;  . LEFT HEART CATHETERIZATION WITH CORONARY ANGIOGRAM N/A 09/06/2013   STEMI, 2nd ISR LAD. Procedure: LEFT HEART CATHETERIZATION  WITH CORONARY ANGIOGRAM;  Surgeon: [name], MD;  Location: [facility] CATH LAB;  Service: Cardiovascular;  Laterality: N/A;  . PERCUTANEOUS CORONARY STENT INTERVENTION (PCI-S)  07/08/2012   Procedure: PERCUTANEOUS CORONARY STENT INTERVENTION (PCI-S);  Surgeon: [name], MD;  Location: [facility] CATH LAB;  Service: Cardiovascular;;  DES LAD  . PERCUTANEOUS CORONARY STENT INTERVENTION (PCI-S) N/A 09/06/2013   Procedure: PERCUTANEOUS CORONARY STENT INTERVENTION (PCI-S);   Surgeon: [name], MD;  Location: [facility] CATH LAB;  Service: Cardiovascular;  Laterality: N/A;  Mid LAD 3.0/24mm Promus       Home Medications    Prior to Admission medications   Medication Sig Start Date End Date Taking? Authorizing Provider  acetaminophen (TYLENOL) 325 MG tablet Take 2 tablets (650 mg total) by mouth every 4 (four) hours as needed for headache or mild pain. 09/09/13   [name], PA-C  aspirin EC 81 MG tablet Take 81 mg by mouth daily.     [provider]  atorvastatin (LIPITOR) 80 MG tablet Take 80 mg by mouth daily.    [provider]  clopidogrel (PLAVIX) 75 MG tablet Take 75 mg by mouth daily.    [provider]  diclofenac sodium (VOLTAREN) 1 % GEL Apply 4 g topically 4 (four) times daily.    [provider]  docusate sodium (COLACE) 100 MG capsule Take 100 mg by mouth 2 (two) times daily.    [provider]  glipiZIDE (GLUCOTROL) 5 MG tablet Take 5 mg by mouth 2 (two) times daily.    [provider]  HYDROcodone-acetaminophen (NORCO/VICODIN) 5-325 MG tablet Take 2 tablets by mouth every 4 (four) hours as needed. 07/29/16   Fransico Meadow, PA-C  hydrocortisone cream 0.5 % Apply 1 application topically 2 (two) times daily.    [provider]  isosorbide mononitrate (IMDUR) 30 MG 24 hr tablet Take 1 tablet (30 mg total) by mouth daily. 09/24/15 05/24/16  Lendon Colonel, NP  lisinopril (PRINIVIL,ZESTRIL) 2.5 MG tablet Take 2.5 mg by mouth daily.    [provider]  metFORMIN (GLUCOPHAGE) 1000 MG tablet Take 1 tablet (1,000 mg total) by mouth 2 (two) times daily with a meal. Do not restart Metformin until Wednesday am 7/2 Patient taking differently: Take 1,000 mg by mouth 2 (two) times daily.  07/10/12   [name], MD  methocarbamol (ROBAXIN) 500 MG tablet Take 1 tablet (500 mg total) by mouth 2 (two) times daily. 07/29/16   Fransico Meadow, PA-C  metoprolol succinate (TOPROL-XL) 25 MG  24 hr tablet Take 25 mg by mouth daily.    [provider]  nitroGLYCERIN (NITROSTAT) 0.4 MG SL tablet Place 1 tablet (0.4 mg total) under the tongue every 5 (five) minutes x 3 doses as needed for chest pain. 05/03/14   Thurnell Lose, MD  ticagrelor (BRILINTA) 90 MG TABS tablet Take 90 mg by mouth 2 (two) times daily.    [provider]  Vitamin D, Ergocalciferol, (DRISDOL) 50000 units CAPS capsule Take 50,000 Units by mouth every 7 (seven) days.    [provider]    Family History Family History  Problem Relation Age of Onset  . Hypertension Mother   . Diabetes Mother   . Hypertension Father   . Diabetes Father   . Hypertension Sister   . Diabetes Sister     Social History Social History  Substance Use Topics  . Smoking status: Current Every Day Smoker    Packs/day:  0.50    Types: Cigarettes  . Smokeless tobacco: Never Used  . Alcohol use Yes     Comment: occ     Allergies   Patient has no known allergies.   Review of Systems Review of Systems  Musculoskeletal: Positive for back pain.  All other systems reviewed and are negative.    Physical Exam Updated Vital Signs BP (!) 119/91   Pulse 94   Temp 97.6 F (36.4 C)   Resp 18   Ht 5\' 9"  (1.753 m)   Wt 108.9 kg (240 lb)   SpO2 96%   BMI 35.44 kg/m   Physical Exam  Constitutional: He appears well-developed and well-nourished.  HENT:  Head: Normocephalic and atraumatic.  Eyes: Conjunctivae are normal.  Neck: Neck supple.  Cardiovascular: Normal rate and regular rhythm.   No murmur heard. Pulmonary/Chest: Effort normal and breath sounds normal. No respiratory distress.  Abdominal: Soft. There is no tenderness.  Musculoskeletal: He exhibits no edema.  Diffusely tender low back,  Pain with movement  Neurological: He is alert.  Skin: Skin is warm and dry.  Psychiatric: He has a normal mood and affect.  Nursing note and vitals reviewed.    ED Treatments / Results   Labs (all labs ordered are listed, but only abnormal results are displayed) Labs Reviewed  CBG MONITORING, ED - Abnormal; Notable for the following:       Result Value   Glucose-Capillary 111 (*)    All other components within normal limits    EKG  EKG Interpretation None       Radiology No results found.  Procedures Procedures (including critical care time)  Medications Ordered in ED Medications  methocarbamol (ROBAXIN) tablet 500 mg (500 mg Oral Given 07/29/16 2038)  HYDROcodone-acetaminophen (NORCO/VICODIN) 5-325 MG per tablet 1 tablet (1 tablet Oral Given 07/29/16 2038)     Initial Impression / Assessment and Plan / ED Course  I have reviewed the triage vital signs and the nursing notes.  Pertinent labs & imaging results that were available during my care of the patient were reviewed by me and considered in my medical decision making (see chart for details).       Final Clinical Impressions(s) / ED Diagnoses   Final diagnoses:  Acute low back pain without sciatica, unspecified back pain laterality  Acute pain of left knee   An After Visit Summary was printed and given to the patient. New Prescriptions Discharge Medication List as of 07/29/2016  8:28 PM    An After Visit Summary was printed and given to the patient. Meds ordered this encounter  Medications  . methocarbamol (ROBAXIN) 500 MG tablet    Sig: Take 1 tablet (500 mg total) by mouth 2 (two) times daily.    Dispense:  20 tablet    Refill:  0    Order Specific Question:   Supervising Provider    Answer:   MILLER, BRIAN [[id number]]  . HYDROcodone-acetaminophen (NORCO/VICODIN) 5-325 MG tablet    Sig: Take 2 tablets by mouth every 4 (four) hours as needed.    Dispense:  10 tablet    Refill:  0    Order Specific Question:   Supervising Provider    Answer:   Sabra Heck, BRIAN [[id number]]  . methocarbamol (ROBAXIN) tablet 500 mg  . HYDROcodone-acetaminophen (NORCO/VICODIN) 5-325 MG per tablet 1 tablet      Sidney Ace 07/30/16 Annita Brod, MD 07/30/16 [phone]

## 2016-08-01 ENCOUNTER — Emergency Department (HOSPITAL_COMMUNITY)
Admission: EM | Admit: 2016-08-01 | Discharge: 2016-08-01 | Disposition: A | Discharge status: Home / Self Care | Payer: Medicare Other | Attending: Emergency Medicine | Admitting: Emergency Medicine

## 2016-08-01 ENCOUNTER — Encounter (HOSPITAL_COMMUNITY): Payer: Self-pay

## 2016-08-01 DIAGNOSIS — N183 Chronic kidney disease, stage 3 (moderate): Secondary | ICD-10-CM | POA: Diagnosis not present

## 2016-08-01 DIAGNOSIS — I509 Heart failure, unspecified: Secondary | ICD-10-CM | POA: Diagnosis not present

## 2016-08-01 DIAGNOSIS — Z7982 Long term (current) use of aspirin: Secondary | ICD-10-CM | POA: Diagnosis not present

## 2016-08-01 DIAGNOSIS — Z79899 Other long term (current) drug therapy: Secondary | ICD-10-CM | POA: Diagnosis not present

## 2016-08-01 DIAGNOSIS — F17210 Nicotine dependence, cigarettes, uncomplicated: Secondary | ICD-10-CM | POA: Insufficient documentation

## 2016-08-01 DIAGNOSIS — Z7984 Long term (current) use of oral hypoglycemic drugs: Secondary | ICD-10-CM | POA: Diagnosis not present

## 2016-08-01 DIAGNOSIS — I110 Hypertensive heart disease with heart failure: Secondary | ICD-10-CM | POA: Insufficient documentation

## 2016-08-01 DIAGNOSIS — I129 Hypertensive chronic kidney disease with stage 1 through stage 4 chronic kidney disease, or unspecified chronic kidney disease: Secondary | ICD-10-CM | POA: Insufficient documentation

## 2016-08-01 DIAGNOSIS — I2510 Atherosclerotic heart disease of native coronary artery without angina pectoris: Secondary | ICD-10-CM | POA: Insufficient documentation

## 2016-08-01 DIAGNOSIS — L03113 Cellulitis of right upper limb: Secondary | ICD-10-CM | POA: Diagnosis not present

## 2016-08-01 DIAGNOSIS — E1122 Type 2 diabetes mellitus with diabetic chronic kidney disease: Secondary | ICD-10-CM | POA: Diagnosis not present

## 2016-08-01 DIAGNOSIS — R2231 Localized swelling, mass and lump, right upper limb: Secondary | ICD-10-CM | POA: Diagnosis present

## 2016-08-01 LAB — CBC WITH DIFFERENTIAL/PLATELET
Basophils Absolute: 0 10*3/uL (ref 0.0–0.1)
Basophils Relative: 0 %
EOS ABS: 0.6 10*3/uL (ref 0.0–0.7)
Eosinophils Relative: 6 %
HEMATOCRIT: 44.7 % (ref 39.0–52.0)
HEMOGLOBIN: 14.4 g/dL (ref 13.0–17.0)
LYMPHS ABS: 2 10*3/uL (ref 0.7–4.0)
LYMPHS PCT: 18 %
MCH: 25.7 pg — AB (ref 26.0–34.0)
MCHC: 32.2 g/dL (ref 30.0–36.0)
MCV: 79.7 fL (ref 78.0–100.0)
MONOS PCT: 4 %
Monocytes Absolute: 0.5 10*3/uL (ref 0.1–1.0)
NEUTROS ABS: 7.9 10*3/uL — AB (ref 1.7–7.7)
NEUTROS PCT: 72 %
Platelets: 269 10*3/uL (ref 150–400)
RBC: 5.61 MIL/uL (ref 4.22–5.81)
RDW: 16.4 % — ABNORMAL HIGH (ref 11.5–15.5)
WBC: 10.9 10*3/uL — ABNORMAL HIGH (ref 4.0–10.5)

## 2016-08-01 LAB — COMPREHENSIVE METABOLIC PANEL
ALK PHOS: 96 U/L (ref 38–126)
ALT: 12 U/L — AB (ref 17–63)
ANION GAP: 9 (ref 5–15)
AST: 11 U/L — ABNORMAL LOW (ref 15–41)
Albumin: 3.8 g/dL (ref 3.5–5.0)
BILIRUBIN TOTAL: 0.4 mg/dL (ref 0.3–1.2)
BUN: 15 mg/dL (ref 6–20)
CALCIUM: 8.9 mg/dL (ref 8.9–10.3)
CO2: 27 mmol/L (ref 22–32)
CREATININE: 1.24 mg/dL (ref 0.61–1.24)
Chloride: 102 mmol/L (ref 101–111)
Glucose, Bld: 128 mg/dL — ABNORMAL HIGH (ref 65–99)
Potassium: 3.8 mmol/L (ref 3.5–5.1)
Sodium: 138 mmol/L (ref 135–145)
TOTAL PROTEIN: 7.8 g/dL (ref 6.5–8.1)

## 2016-08-01 LAB — URIC ACID: Uric Acid, Serum: 5.8 mg/dL (ref 4.4–7.6)

## 2016-08-01 MED ORDER — CEPHALEXIN 500 MG PO CAPS: 500.0000 mg | ORAL_CAPSULE | Freq: Four times a day (QID) | ORAL | 0 refills | Status: DC

## 2016-08-01 MED ORDER — OXYCODONE-ACETAMINOPHEN 5-325 MG PO TABS: 2.0000 | ORAL_TABLET | ORAL | 0 refills | Status: DC | PRN

## 2016-08-01 MED ORDER — CEFTRIAXONE SODIUM 1 G IJ SOLR: 1.0000 g | Freq: Once | INTRAMUSCULAR | Status: DC

## 2016-08-01 MED ORDER — LIDOCAINE HCL (PF) 1 % IJ SOLN
INTRAMUSCULAR | Status: AC
  Filled 2016-08-01: qty 5

## 2016-08-01 MED ORDER — CEFTRIAXONE SODIUM 1 G IJ SOLR
1.0000 g | Freq: Once | INTRAMUSCULAR | Status: AC
  Administered 2016-08-01: 1 g via INTRAMUSCULAR
  Filled 2016-08-01: qty 10

## 2016-08-01 MED ORDER — SULFAMETHOXAZOLE-TRIMETHOPRIM 800-160 MG PO TABS: 1.0000 | ORAL_TABLET | Freq: Two times a day (BID) | ORAL | 0 refills | Status: DC

## 2016-08-01 MED ORDER — OXYCODONE-ACETAMINOPHEN 5-325 MG PO TABS
2.0000 | ORAL_TABLET | Freq: Once | ORAL | Status: AC
  Administered 2016-08-01: 2 via ORAL
  Filled 2016-08-01: qty 2

## 2016-08-01 NOTE — ED Provider Notes (Signed)
[facility] DEPT Provider Note   CSN: [id number] Arrival date & time: 08/01/16  7829     History   Chief Complaint Chief Complaint  Patient presents with  . Hand Pain    HPI [name] is a 61 y.o. male.  The history is provided by the patient. No language interpreter was used.  Hand Pain  This is a new problem. The current episode started yesterday. The problem occurs constantly. The problem has been gradually worsening. Nothing aggravates the symptoms. Nothing relieves the symptoms. He has tried nothing for the symptoms. The treatment provided no relief.   Pt complains of swelling to his right hand.  Pt seen here 3 days ago by me for back pain.   No hand problem at taht time. No injury Past Medical History:  Diagnosis Date  . CAD (coronary artery disease) 5\6\2130   Cardiac cath at [facility]. # vessel CAD, with patent stent in the PLAD and RCA. Diffuse dLAD, OM2, and  RPDA disease. No aortic stenosis. Elevated LVEDP. No lesion to suggest ACS. Continue medical managment.   . CHF (congestive heart failure) ([place])   . Coronary artery disease   . Diabetes mellitus without complication ([place])   . DM2 (diabetes mellitus, type 2) ([place])   . HTN (hypertension)   . Hyperlipemia   . Hyperlipidemia   . Hypertension   . MI, old   . Obesity   . Tobacco use     Patient Active Problem List   Diagnosis Date Noted  . AKI (acute kidney injury) ([place]) 01/31/2016  . Lobar pneumonia ([place]) 01/31/2016  . Influenza A 01/31/2016  . Diarrhea 01/30/2016  . NSVT (nonsustained ventricular tachycardia) ([place]) 09/12/2015  . Precordial chest pain 09/10/2015  . Cocaine use 09/04/2015  . Near syncope 06/29/2015  . Essential hypertension 06/29/2015  . History of heart artery stents 06/29/2015  . Insulin dependent diabetes mellitus ([place]) 06/29/2015  . History of MI (myocardial infarction) 06/29/2015  . Chronic systolic CHF 86/57/8469  . Musculoskeletal chest pain 05/27/2015  .  Rectal bleeding   . Stage 3 chronic renal impairment associated with type 2 diabetes mellitus ([place]) 09/09/2013  . 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 CAD- residual RCA and OM disease on re-look cath 09/07/13 09/09/2013  . Cardiomyopathy, ischemic-EF 40-45% by echo 09/07/13 09/09/2013  . S/P LAD DES June 2014 09/06/2013  . Tobacco use disorder 09/06/2013  . Hyperglycemia 08/22/2012  . NSTEMI (non-ST elevated myocardial infarction) ([place]) 07/07/2012  . Dyslipidemia 07/07/2012  . Hypertensive heart disease     Past Surgical History:  Procedure Laterality Date  . CARDIAC CATHETERIZATION N/A 09/07/2015   Procedure: Left Heart Cath and Coronary Angiography;  Surgeon: Leonie Man, MD;  Location: [facility] CV LAB;  Service: Cardiovascular;  Laterality: N/A;  . CARDIAC CATHETERIZATION N/A 09/07/2015   Procedure: Coronary Stent Intervention;  Surgeon: Leonie Man, MD;  Location: [facility] CV LAB;  Service: Cardiovascular;  Laterality: N/A;  . CORONARY ANGIOGRAM  09/07/13   residual RCA and OM disease  . CORONARY ANGIOPLASTY WITH STENT PLACEMENT    . LEFT HEART CATH Bilateral 07/08/2012   Procedure: LEFT HEART CATH;  Surgeon: [name], MD;  Location: [facility] CATH LAB;  Service: Cardiovascular;  Laterality: Bilateral;  . LEFT HEART CATHETERIZATION WITH CORONARY ANGIOGRAM N/A 09/06/2013   STEMI, 2nd ISR LAD. Procedure: LEFT HEART CATHETERIZATION WITH CORONARY ANGIOGRAM;  Surgeon: [name], MD;  Location: [facility] CATH LAB;  Service: Cardiovascular;  Laterality: N/A;  . PERCUTANEOUS CORONARY STENT INTERVENTION (PCI-S)  07/08/2012   Procedure: PERCUTANEOUS CORONARY STENT INTERVENTION (PCI-S);  Surgeon: [name], MD;  Location: [facility] CATH LAB;  Service: Cardiovascular;;  DES LAD  . PERCUTANEOUS CORONARY STENT INTERVENTION (PCI-S) N/A 09/06/2013   Procedure: PERCUTANEOUS CORONARY STENT INTERVENTION (PCI-S);  Surgeon: [name], MD;  Location: [facility] CATH  LAB;  Service: Cardiovascular;  Laterality: N/A;  Mid LAD 3.0/24mm Promus       Home Medications    Prior to Admission medications   Medication Sig Start Date End Date Taking? Authorizing Provider  acetaminophen (TYLENOL) 325 MG tablet Take 2 tablets (650 mg total) by mouth every 4 (four) hours as needed for headache or mild pain. 09/09/13   [name], PA-C  aspirin EC 81 MG tablet Take 81 mg by mouth daily.     [provider]  atorvastatin (LIPITOR) 80 MG tablet Take 80 mg by mouth daily.    [provider]  cephALEXin (KEFLEX) 500 MG capsule Take 1 capsule (500 mg total) by mouth 4 (four) times daily. 08/01/16   Fransico Meadow, PA-C  clopidogrel (PLAVIX) 75 MG tablet Take 75 mg by mouth daily.    [provider]  diclofenac sodium (VOLTAREN) 1 % GEL Apply 4 g topically 4 (four) times daily.    [provider]  docusate sodium (COLACE) 100 MG capsule Take 100 mg by mouth 2 (two) times daily.    [provider]  glipiZIDE (GLUCOTROL) 5 MG tablet Take 5 mg by mouth 2 (two) times daily.    [provider]  HYDROcodone-acetaminophen (NORCO/VICODIN) 5-325 MG tablet Take 2 tablets by mouth every 4 (four) hours as needed. 07/29/16   Fransico Meadow, PA-C  hydrocortisone cream 0.5 % Apply 1 application topically 2 (two) times daily.    [provider]  isosorbide mononitrate (IMDUR) 30 MG 24 hr tablet Take 1 tablet (30 mg total) by mouth daily. 09/24/15 05/24/16  Lendon Colonel, NP  lisinopril (PRINIVIL,ZESTRIL) 2.5 MG tablet Take 2.5 mg by mouth daily.    [provider]  metFORMIN (GLUCOPHAGE) 1000 MG tablet Take 1 tablet (1,000 mg total) by mouth 2 (two) times daily with a meal. Do not restart Metformin until Wednesday am 7/2 Patient taking differently: Take 1,000 mg by mouth 2 (two) times daily.  07/10/12   [name], MD  methocarbamol (ROBAXIN) 500 MG tablet Take 1 tablet (500 mg total) by mouth 2 (two) times  daily. 07/29/16   Fransico Meadow, PA-C  metoprolol succinate (TOPROL-XL) 25 MG 24 hr tablet Take 25 mg by mouth daily.    [provider]  nitroGLYCERIN (NITROSTAT) 0.4 MG SL tablet Place 1 tablet (0.4 mg total) under the tongue every 5 (five) minutes x 3 doses as needed for chest pain. 05/03/14   Thurnell Lose, MD  oxyCODONE-acetaminophen (PERCOCET/ROXICET) 5-325 MG tablet Take 2 tablets by mouth every 4 (four) hours as needed for severe pain. 08/01/16   Fransico Meadow, PA-C  sulfamethoxazole-trimethoprim (BACTRIM DS,SEPTRA DS) 800-160 MG tablet Take 1 tablet by mouth 2 (two) times daily. 08/01/16 08/08/16  Fransico Meadow, PA-C  ticagrelor (BRILINTA) 90 MG TABS tablet Take 90 mg by mouth 2 (two) times daily.    [provider]  Vitamin D, Ergocalciferol, (DRISDOL) 50000 units CAPS capsule Take 50,000 Units by mouth every 7 (seven) days.    [provider]    Family History  Family History  Problem Relation Age of Onset  . Hypertension Mother   . Diabetes Mother   . Hypertension Father   . Diabetes Father   . Hypertension Sister   . Diabetes Sister     Social History Social History  Substance Use Topics  . Smoking status: Current Every Day Smoker    Packs/day: 0.50    Types: Cigarettes  . Smokeless tobacco: Never Used  . Alcohol use Yes     Comment: occ     Allergies   Patient has no known allergies.   Review of Systems Review of Systems  Musculoskeletal: Positive for myalgias.  All other systems reviewed and are negative.    Physical Exam Updated Vital Signs BP 131/82 (BP Location: Right Arm)   Pulse 77   Temp 98.3 F (36.8 C) (Temporal)   Resp 20   Ht 5\' 9"  (1.753 m)   Wt 63.5 kg (140 lb)   SpO2 97%   BMI 20.67 kg/m   Physical Exam  Constitutional: He appears well-developed and well-nourished.  HENT:  Head: Normocephalic.  Musculoskeletal: He exhibits tenderness. He exhibits no deformity.  Neurological: He is alert.  Skin:  Skin is warm. There is erythema.  Psychiatric: He has a normal mood and affect.  Nursing note and vitals reviewed. swollen red right hand,     ED Treatments / Results  Labs (all labs ordered are listed, but only abnormal results are displayed) Labs Reviewed  CBC WITH DIFFERENTIAL/PLATELET - Abnormal; Notable for the following:       Result Value   WBC 10.9 (*)    MCH 25.7 (*)    RDW 16.4 (*)    Neutro Abs 7.9 (*)    All other components within normal limits  COMPREHENSIVE METABOLIC PANEL - Abnormal; Notable for the following:    Glucose, Bld 128 (*)    AST 11 (*)    ALT 12 (*)    All other components within normal limits  URIC ACID    EKG  EKG Interpretation None       Radiology No results found.  Procedures Procedures (including critical care time)  Medications Ordered in ED Medications  oxyCODONE-acetaminophen (PERCOCET/ROXICET) 5-325 MG per tablet 2 tablet (not administered)  cefTRIAXone (ROCEPHIN) injection 1 g (not administered)     Initial Impression / Assessment and Plan / ED Course  I have reviewed the triage vital signs and the nursing notes.  Pertinent labs & imaging results that were available during my care of the patient were reviewed by me and considered in my medical decision making (see chart for details).       Final Clinical Impressions(s) / ED Diagnoses   Final diagnoses:  Cellulitis of right hand    New Prescriptions New Prescriptions   CEPHALEXIN (KEFLEX) 500 MG CAPSULE    Take 1 capsule (500 mg total) by mouth 4 (four) times daily.   OXYCODONE-ACETAMINOPHEN (PERCOCET/ROXICET) 5-325 MG TABLET    Take 2 tablets by mouth every 4 (four) hours as needed for severe pain.   SULFAMETHOXAZOLE-TRIMETHOPRIM (BACTRIM DS,SEPTRA DS) 800-160 MG TABLET    Take 1 tablet by mouth 2 (two) times daily.   An After Visit Summary was printed and given to the patient. Pt advised to return here for recheck    Sidney Ace 08/01/16 2306      Francine Graven, DO 08/05/16 [phone]

## 2016-08-01 NOTE — ED Triage Notes (Signed)
Right hand pain/swelling and redness x2 days.

## 2016-08-01 NOTE — Discharge Instructions (Signed)
Return here tomorrow for recheck at 12 noon.

## 2016-08-02 ENCOUNTER — Encounter (HOSPITAL_COMMUNITY): Payer: Self-pay | Admitting: Emergency Medicine

## 2016-08-02 ENCOUNTER — Observation Stay (HOSPITAL_COMMUNITY)
Admission: EM | Admit: 2016-08-02 | Discharge: 2016-08-04 | Disposition: A | Discharge status: Home / Self Care | Payer: Medicare Other | Attending: Internal Medicine | Admitting: Internal Medicine

## 2016-08-02 DIAGNOSIS — G8929 Other chronic pain: Secondary | ICD-10-CM | POA: Diagnosis not present

## 2016-08-02 DIAGNOSIS — E1149 Type 2 diabetes mellitus with other diabetic neurological complication: Secondary | ICD-10-CM | POA: Diagnosis not present

## 2016-08-02 DIAGNOSIS — N182 Chronic kidney disease, stage 2 (mild): Secondary | ICD-10-CM | POA: Diagnosis present

## 2016-08-02 DIAGNOSIS — Z7902 Long term (current) use of antithrombotics/antiplatelets: Secondary | ICD-10-CM | POA: Insufficient documentation

## 2016-08-02 DIAGNOSIS — R2231 Localized swelling, mass and lump, right upper limb: Secondary | ICD-10-CM | POA: Diagnosis present

## 2016-08-02 DIAGNOSIS — I110 Hypertensive heart disease with heart failure: Secondary | ICD-10-CM | POA: Diagnosis not present

## 2016-08-02 DIAGNOSIS — F17210 Nicotine dependence, cigarettes, uncomplicated: Secondary | ICD-10-CM | POA: Diagnosis not present

## 2016-08-02 DIAGNOSIS — I2510 Atherosclerotic heart disease of native coronary artery without angina pectoris: Secondary | ICD-10-CM | POA: Diagnosis not present

## 2016-08-02 DIAGNOSIS — I5022 Chronic systolic (congestive) heart failure: Secondary | ICD-10-CM | POA: Diagnosis not present

## 2016-08-02 DIAGNOSIS — I2583 Coronary atherosclerosis due to lipid rich plaque: Secondary | ICD-10-CM

## 2016-08-02 DIAGNOSIS — E1140 Type 2 diabetes mellitus with diabetic neuropathy, unspecified: Secondary | ICD-10-CM | POA: Diagnosis not present

## 2016-08-02 DIAGNOSIS — Z7984 Long term (current) use of oral hypoglycemic drugs: Secondary | ICD-10-CM | POA: Insufficient documentation

## 2016-08-02 DIAGNOSIS — I10 Essential (primary) hypertension: Secondary | ICD-10-CM | POA: Diagnosis present

## 2016-08-02 DIAGNOSIS — L03113 Cellulitis of right upper limb: Secondary | ICD-10-CM | POA: Diagnosis not present

## 2016-08-02 DIAGNOSIS — I5023 Acute on chronic systolic (congestive) heart failure: Secondary | ICD-10-CM | POA: Diagnosis present

## 2016-08-02 DIAGNOSIS — Z79899 Other long term (current) drug therapy: Secondary | ICD-10-CM | POA: Diagnosis not present

## 2016-08-02 DIAGNOSIS — L03119 Cellulitis of unspecified part of limb: Secondary | ICD-10-CM

## 2016-08-02 DIAGNOSIS — Z7982 Long term (current) use of aspirin: Secondary | ICD-10-CM | POA: Diagnosis not present

## 2016-08-02 DIAGNOSIS — Z9861 Coronary angioplasty status: Secondary | ICD-10-CM | POA: Diagnosis not present

## 2016-08-02 LAB — BASIC METABOLIC PANEL
ANION GAP: 6 (ref 5–15)
BUN: 14 mg/dL (ref 6–20)
CO2: 28 mmol/L (ref 22–32)
Calcium: 9 mg/dL (ref 8.9–10.3)
Chloride: 102 mmol/L (ref 101–111)
Creatinine, Ser: 1.25 mg/dL — ABNORMAL HIGH (ref 0.61–1.24)
GFR calc Af Amer: >60 mL/min (ref >60)
GLUCOSE: 130 mg/dL — AB (ref 65–99)
POTASSIUM: 3.7 mmol/L (ref 3.5–5.1)
Sodium: 136 mmol/L (ref 135–145)

## 2016-08-02 LAB — CBC WITH DIFFERENTIAL/PLATELET
BASOS PCT: 0 %
Basophils Absolute: 0 10*3/uL (ref 0.0–0.1)
Eosinophils Absolute: 0.6 10*3/uL (ref 0.0–0.7)
Eosinophils Relative: 6 %
HEMATOCRIT: 43 % (ref 39.0–52.0)
HEMOGLOBIN: 14 g/dL (ref 13.0–17.0)
LYMPHS ABS: 1.7 10*3/uL (ref 0.7–4.0)
LYMPHS PCT: 20 %
MCH: 25.7 pg — AB (ref 26.0–34.0)
MCHC: 32.6 g/dL (ref 30.0–36.0)
MCV: 79 fL (ref 78.0–100.0)
MONO ABS: 0.4 10*3/uL (ref 0.1–1.0)
MONOS PCT: 5 %
NEUTROS ABS: 5.9 10*3/uL (ref 1.7–7.7)
NEUTROS PCT: 69 %
Platelets: 264 10*3/uL (ref 150–400)
RBC: 5.44 MIL/uL (ref 4.22–5.81)
RDW: 17.5 % — AB (ref 11.5–15.5)
WBC: 8.6 10*3/uL (ref 4.0–10.5)

## 2016-08-02 LAB — LACTIC ACID, PLASMA: Lactic Acid, Venous: 1 mmol/L (ref 0.5–1.9)

## 2016-08-02 LAB — GLUCOSE, CAPILLARY: Glucose-Capillary: 242 mg/dL — ABNORMAL HIGH (ref 65–99)

## 2016-08-02 MED ORDER — SODIUM CHLORIDE 0.9 % IV SOLN: 250.0000 mL | INTRAVENOUS | Status: DC | PRN

## 2016-08-02 MED ORDER — HYDRALAZINE HCL 20 MG/ML IJ SOLN: 10.0000 mg | INTRAMUSCULAR | Status: DC | PRN

## 2016-08-02 MED ORDER — CLINDAMYCIN PHOSPHATE 600 MG/50ML IV SOLN
600.0000 mg | Freq: Once | INTRAVENOUS | Status: AC
Start: 2016-08-02 — End: 2016-08-02
  Administered 2016-08-02: 600 mg via INTRAVENOUS
  Filled 2016-08-02: qty 50

## 2016-08-02 MED ORDER — TICAGRELOR 90 MG PO TABS: 90.0000 mg | ORAL_TABLET | Freq: Two times a day (BID) | ORAL | Status: DC

## 2016-08-02 MED ORDER — OXYCODONE-ACETAMINOPHEN 5-325 MG PO TABS
2.0000 | ORAL_TABLET | Freq: Once | ORAL | Status: AC
  Administered 2016-08-02: 2 via ORAL
  Filled 2016-08-02: qty 2

## 2016-08-02 MED ORDER — ASPIRIN EC 81 MG PO TBEC
81.0000 mg | DELAYED_RELEASE_TABLET | Freq: Every day | ORAL | Status: DC
  Administered 2016-08-03: 81 mg via ORAL
  Filled 2016-08-02 (×2): qty 1

## 2016-08-02 MED ORDER — DEXTROSE 5 % IV SOLN
1.0000 g | INTRAVENOUS | Status: DC
  Administered 2016-08-02 – 2016-08-03 (×2): 1 g via INTRAVENOUS
  Filled 2016-08-02 (×4): qty 10

## 2016-08-02 MED ORDER — CLOPIDOGREL BISULFATE 75 MG PO TABS
75.0000 mg | ORAL_TABLET | Freq: Every day | ORAL | Status: DC
  Filled 2016-08-02: qty 1

## 2016-08-02 MED ORDER — ONDANSETRON HCL 4 MG/2ML IJ SOLN: 4.0000 mg | Freq: Four times a day (QID) | INTRAMUSCULAR | Status: DC | PRN

## 2016-08-02 MED ORDER — DICLOFENAC SODIUM 1 % TD GEL
4.0000 g | Freq: Four times a day (QID) | TRANSDERMAL | Status: DC
  Administered 2016-08-02 – 2016-08-04 (×6): 4 g via TOPICAL
  Filled 2016-08-02 (×2): qty 100

## 2016-08-02 MED ORDER — SENNOSIDES-DOCUSATE SODIUM 8.6-50 MG PO TABS
1.0000 | ORAL_TABLET | Freq: Every evening | ORAL | Status: DC | PRN
  Filled 2016-08-02: qty 1

## 2016-08-02 MED ORDER — DOCUSATE SODIUM 100 MG PO CAPS
100.0000 mg | ORAL_CAPSULE | Freq: Two times a day (BID) | ORAL | Status: DC
  Administered 2016-08-02 – 2016-08-03 (×3): 100 mg via ORAL
  Filled 2016-08-02 (×4): qty 1

## 2016-08-02 MED ORDER — SODIUM CHLORIDE 0.9% FLUSH
3.0000 mL | Freq: Two times a day (BID) | INTRAVENOUS | Status: DC
  Administered 2016-08-02 – 2016-08-04 (×4): 3 mL via INTRAVENOUS

## 2016-08-02 MED ORDER — INSULIN ASPART 100 UNIT/ML ~~LOC~~ SOLN
0.0000 [IU] | Freq: Every day | SUBCUTANEOUS | Status: DC
  Administered 2016-08-02: 2 [IU] via SUBCUTANEOUS

## 2016-08-02 MED ORDER — ACETAMINOPHEN 650 MG RE SUPP: 650.0000 mg | Freq: Four times a day (QID) | RECTAL | Status: DC | PRN

## 2016-08-02 MED ORDER — ONDANSETRON HCL 4 MG PO TABS: 4.0000 mg | ORAL_TABLET | Freq: Four times a day (QID) | ORAL | Status: DC | PRN

## 2016-08-02 MED ORDER — SODIUM CHLORIDE 0.9% FLUSH: 3.0000 mL | INTRAVENOUS | Status: DC | PRN

## 2016-08-02 MED ORDER — LISINOPRIL 5 MG PO TABS
2.5000 mg | ORAL_TABLET | Freq: Every day | ORAL | Status: DC
  Administered 2016-08-03: 2.5 mg via ORAL
  Filled 2016-08-02 (×2): qty 1

## 2016-08-02 MED ORDER — ACETAMINOPHEN 325 MG PO TABS: 650.0000 mg | ORAL_TABLET | Freq: Four times a day (QID) | ORAL | Status: DC | PRN

## 2016-08-02 MED ORDER — ATORVASTATIN CALCIUM 40 MG PO TABS
80.0000 mg | ORAL_TABLET | Freq: Every day | ORAL | Status: DC
  Administered 2016-08-02: 80 mg via ORAL
  Filled 2016-08-02 (×2): qty 2

## 2016-08-02 MED ORDER — BISACODYL 5 MG PO TBEC: 5.0000 mg | DELAYED_RELEASE_TABLET | Freq: Every day | ORAL | Status: DC | PRN

## 2016-08-02 MED ORDER — ENOXAPARIN SODIUM 40 MG/0.4ML ~~LOC~~ SOLN
40.0000 mg | SUBCUTANEOUS | Status: DC
  Administered 2016-08-02 – 2016-08-03 (×2): 40 mg via SUBCUTANEOUS
  Filled 2016-08-02 (×2): qty 0.4

## 2016-08-02 MED ORDER — DEXTROSE 5 % IV SOLN: 1.0000 g | INTRAVENOUS | Status: DC

## 2016-08-02 MED ORDER — OXYCODONE-ACETAMINOPHEN 5-325 MG PO TABS
ORAL_TABLET | ORAL | Status: AC
  Filled 2016-08-02: qty 1

## 2016-08-02 MED ORDER — INSULIN ASPART 100 UNIT/ML ~~LOC~~ SOLN
0.0000 [IU] | Freq: Three times a day (TID) | SUBCUTANEOUS | Status: DC
  Administered 2016-08-03: 2 [IU] via SUBCUTANEOUS
  Administered 2016-08-03: 1 [IU] via SUBCUTANEOUS

## 2016-08-02 MED ORDER — METHOCARBAMOL 500 MG PO TABS
500.0000 mg | ORAL_TABLET | Freq: Two times a day (BID) | ORAL | Status: DC
  Administered 2016-08-02 – 2016-08-03 (×3): 500 mg via ORAL
  Filled 2016-08-02 (×4): qty 1

## 2016-08-02 MED ORDER — OXYCODONE-ACETAMINOPHEN 5-325 MG PO TABS
1.0000 | ORAL_TABLET | ORAL | Status: DC | PRN
  Administered 2016-08-02 – 2016-08-03 (×3): 2 via ORAL
  Filled 2016-08-02 (×3): qty 2

## 2016-08-02 MED ORDER — METOPROLOL SUCCINATE ER 25 MG PO TB24
25.0000 mg | ORAL_TABLET | Freq: Every day | ORAL | Status: DC
  Administered 2016-08-03: 25 mg via ORAL
  Filled 2016-08-02 (×2): qty 1

## 2016-08-02 NOTE — H&P (Signed)
History and Physical    [name] DJM:[id number] DOB: 09/09/1955 DOA: 08/02/2016  PCP: [name], MD   Patient coming from: Home  Chief Complaint: Pain, swelling, and redness in right hand worsening despite abx at home  HPI: [name] is a 61 y.o. right-handed male with medical history significant for poorly controlled type 2 diabetes mellitus, coronary artery disease with stents, chronic kidney disease stage II, hypertension, and chronic low back and knee pain, now presenting to emergency department for evaluation of pain, swelling, and redness involving the right hand. Patient reports that he had been suffering from acute on chronic low back pain, but otherwise in his usual state of health until the morning of 08/06/2016, when he noted pain, swelling, and redness over the dorsum of his right hand. There had not been any injury or trauma to the site and he had never experienced similar symptoms previously. He came into the emergency department yesterday for evaluation of this, was diagnosed with cellulitis, discharged home with Bactrim and Keflex, and instructed to return should the condition worsen. Reports compliance with the antibiotic, but notes increased pain, swelling, and redness, as well as migration proximally up the dorsal forearm and around to the palmar wrist. He also notes increased difficulty making a fist. He denies any fevers or chills, denies chest pain or palpitations, and denies any dyspnea or cough.  ED Course: Upon arrival to the ED, patient is found to be afebrile, saturating well on room air, and with vitals otherwise stable. Chemistry panels notable for a serum creatinine 1.25, consistent with his apparent baseline. CBC is remarkable for a resolved leukocytosis. Lactic acid is reassuring at 1.0. Patient was treated with clindamycin and Percocet in the ED. Given the rapid worsening in the patient's right hand cellulitis, as demonstrated by increased pain,  swelling, and redness which has extended up the arm proximally overnight, as well as the patient's comorbid diabetes and CAD, he will be observed in the hospital for treatment with IV antibiotics, extremity elevation, and close monitoring.  Review of Systems:  All other systems reviewed and apart from HPI, are negative.  Past Medical History:  Diagnosis Date  . CAD (coronary artery disease) [id number]   Cardiac cath at [facility]. # vessel CAD, with patent stent in the PLAD and RCA. Diffuse dLAD, OM2, and  RPDA disease. No aortic stenosis. Elevated LVEDP. No lesion to suggest ACS. Continue medical managment.   . CHF (congestive heart failure) ([place])   . Coronary artery disease   . Diabetes mellitus without complication ([place])   . DM2 (diabetes mellitus, type 2) ([place])   . HTN (hypertension)   . Hyperlipemia   . Hyperlipidemia   . Hypertension   . MI, old   . Obesity   . Tobacco use     Past Surgical History:  Procedure Laterality Date  . CARDIAC CATHETERIZATION N/A 09/07/2015   Procedure: Left Heart Cath and Coronary Angiography;  Surgeon: Leonie Man, MD;  Location: [place] [facility];  Service: Cardiovascular;  Laterality: N/A;  . CARDIAC CATHETERIZATION N/A 09/07/2015   Procedure: Coronary Stent Intervention;  Surgeon: Leonie Man, MD;  Location: [place] [facility];  Service: Cardiovascular;  Laterality: N/A;  . CORONARY ANGIOGRAM  09/07/13   residual RCA and OM disease  . CORONARY ANGIOPLASTY WITH STENT PLACEMENT    . LEFT HEART CATH Bilateral 07/08/2012   Procedure: LEFT HEART CATH;  Surgeon: [name], MD;  Location: [facility] CATH LAB;  Service: Cardiovascular;  Laterality: Bilateral;  . LEFT HEART CATHETERIZATION WITH CORONARY ANGIOGRAM N/A 09/06/2013   STEMI, 2nd ISR LAD. Procedure: LEFT HEART CATHETERIZATION WITH CORONARY ANGIOGRAM;  Surgeon: [name], MD;  Location: [facility] CATH LAB;  Service: Cardiovascular;  Laterality: N/A;  . PERCUTANEOUS CORONARY STENT  INTERVENTION (PCI-S)  07/08/2012   Procedure: PERCUTANEOUS CORONARY STENT INTERVENTION (PCI-S);  Surgeon: [name], MD;  Location: [facility] CATH LAB;  Service: Cardiovascular;;  DES LAD  . PERCUTANEOUS CORONARY STENT INTERVENTION (PCI-S) N/A 09/06/2013   Procedure: PERCUTANEOUS CORONARY STENT INTERVENTION (PCI-S);  Surgeon: [name], MD;  Location: [facility] CATH LAB;  Service: Cardiovascular;  Laterality: N/A;  Mid LAD 3.0/24mm Promus     reports that he has been smoking Cigarettes.  He has been smoking about 0.50 packs per day. He has never used smokeless tobacco. He reports that he drinks alcohol. He reports that he does not use drugs.  No Known Allergies  Family History  Problem Relation Age of Onset  . Hypertension Mother   . Diabetes Mother   . Hypertension Father   . Diabetes Father   . Hypertension Sister   . Diabetes Sister      Prior to Admission medications   Medication Sig Start Date End Date Taking? Authorizing Provider  acetaminophen (TYLENOL) 325 MG tablet Take 2 tablets (650 mg total) by mouth every 4 (four) hours as needed for headache or mild pain. 09/09/13  Yes [name], PA-C  aspirin EC 81 MG tablet Take 81 mg by mouth daily.     [provider]  atorvastatin (LIPITOR) 80 MG tablet Take 80 mg by mouth daily.    [provider]  cephALEXin (KEFLEX) 500 MG capsule Take 1 capsule (500 mg total) by mouth 4 (four) times daily. 08/01/16   Fransico Meadow, PA-C  clopidogrel (PLAVIX) 75 MG tablet Take 75 mg by mouth daily.    [provider]  diclofenac sodium (VOLTAREN) 1 % GEL Apply 4 g topically 4 (four) times daily.    [provider]  docusate sodium (COLACE) 100 MG capsule Take 100 mg by mouth 2 (two) times daily.    [provider]  glipiZIDE (GLUCOTROL) 5 MG tablet Take 5 mg by mouth 2 (two) times daily.    [provider]  HYDROcodone-acetaminophen (NORCO/VICODIN) 5-325 MG tablet Take 2 tablets by  mouth every 4 (four) hours as needed. 07/29/16   Fransico Meadow, PA-C  hydrocortisone cream 0.5 % Apply 1 application topically 2 (two) times daily.    [provider]  isosorbide mononitrate (IMDUR) 30 MG 24 hr tablet Take 1 tablet (30 mg total) by mouth daily. 09/24/15 05/24/16  Lendon Colonel, NP  lisinopril (PRINIVIL,ZESTRIL) 2.5 MG tablet Take 2.5 mg by mouth daily.    [provider]  metFORMIN (GLUCOPHAGE) 1000 MG tablet Take 1 tablet (1,000 mg total) by mouth 2 (two) times daily with a meal. Do not restart Metformin until Wednesday am 7/2 Patient taking differently: Take 1,000 mg by mouth 2 (two) times daily.  07/10/12   [name], MD  methocarbamol (ROBAXIN) 500 MG tablet Take 1 tablet (500 mg total) by mouth 2 (two) times daily. 07/29/16   Fransico Meadow, PA-C  metoprolol succinate (TOPROL-XL) 25 MG 24 hr tablet Take 25 mg by mouth daily.    [provider]  nitroGLYCERIN (NITROSTAT) 0.4 MG SL tablet Place 1 tablet (0.4 mg total) under the tongue every 5 (five) minutes  x 3 doses as needed for chest pain. 05/03/14   Thurnell Lose, MD  oxyCODONE-acetaminophen (PERCOCET/ROXICET) 5-325 MG tablet Take 2 tablets by mouth every 4 (four) hours as needed for severe pain. 08/01/16   Fransico Meadow, PA-C  sulfamethoxazole-trimethoprim (BACTRIM DS,SEPTRA DS) 800-160 MG tablet Take 1 tablet by mouth 2 (two) times daily. 08/01/16 08/08/16  Fransico Meadow, PA-C  ticagrelor (BRILINTA) 90 MG TABS tablet Take 90 mg by mouth 2 (two) times daily.    [provider]  Vitamin D, Ergocalciferol, (DRISDOL) 50000 units CAPS capsule Take 50,000 Units by mouth every 7 (seven) days.    [provider]    Physical Exam: Vitals:   08/02/16 1206 08/02/16 1630 08/02/16 1645 08/02/16 1700  BP:  108/78  110/76  Pulse:   68   Resp:      Temp:      TempSrc:      SpO2:   100%   Weight: 63.5 kg (140 lb)     Height: 5\' 7"  (1.702 m)         Constitutional: No  acute distress. Appears uncomfortable. No pallor or diaphoresis.  Eyes: PERTLA, lids and conjunctivae normal ENMT: Mucous membranes are moist. Posterior pharynx clear of any exudate or lesions.   Neck: normal, supple, no masses, no thyromegaly Respiratory: clear to auscultation bilaterally, no wheezing, no crackles. Normal respiratory effort.  Cardiovascular: S1 & S2 heard, regular rate and rhythm. No significant JVD. Abdomen: No distension, no tenderness, no masses palpated. Bowel sounds active.  Musculoskeletal: no clubbing / cyanosis. No joint deformity upper and lower extremities.    Skin: Right hand with edema, erythema, and tenderness centered over the dorsum and extending proximally along the dorsal forearm and to the palmar wrist without fluctuance or drainage. Epitrochlear node appreciated on right. Skin is otherwise warm, dry, well-perfused. Neurologic: CN 2-12 grossly intact. Sensation intact, DTR normal. Strength 5/5 in all 4 limbs.  Psychiatric: Alert and oriented x 3. Calm and cooperative.     Labs on Admission: I have personally reviewed following labs and imaging studies  CBC:  Recent Labs Lab 08/01/16 1759 08/02/16 1519  WBC 10.9* 8.6  NEUTROABS 7.9* 5.9  HGB 14.4 14.0  HCT 44.7 43.0  MCV 79.7 79.0  PLT 269 401   Basic Metabolic Panel:  Recent Labs Lab 08/01/16 1759 08/02/16 1519  NA 138 136  K 3.8 3.7  CL 102 102  CO2 27 28  GLUCOSE 128* 130*  BUN 15 14  CREATININE 1.24 1.25*  CALCIUM 8.9 9.0   GFR: Estimated Creatinine Clearance: 55 mL/min (A) (by C-G formula based on SCr of 1.25 mg/dL (H)). Liver Function Tests:  Recent Labs Lab 08/01/16 1759  AST 11*  ALT 12*  ALKPHOS 96  BILITOT 0.4  PROT 7.8  ALBUMIN 3.8   No results for input(s): LIPASE, AMYLASE in the last 168 hours. No results for input(s): AMMONIA in the last 168 hours. Coagulation Profile: No results for input(s): INR, PROTIME in the last 168 hours. Cardiac Enzymes: No results  for input(s): CKTOTAL, CKMB, CKMBINDEX, TROPONINI in the last 168 hours. BNP (last 3 results) No results for input(s): PROBNP in the last 8760 hours. HbA1C: No results for input(s): HGBA1C in the last 72 hours. CBG:  Recent Labs Lab 07/29/16 1916  GLUCAP 111*   Lipid Profile: No results for input(s): CHOL, HDL, LDLCALC, TRIG, CHOLHDL, LDLDIRECT in the last 72 hours. Thyroid Function Tests: No results for input(s): TSH, T4TOTAL, FREET4,  T3FREE, THYROIDAB in the last 72 hours. Anemia Panel: No results for input(s): VITAMINB12, FOLATE, FERRITIN, TIBC, IRON, RETICCTPCT in the last 72 hours. Urine analysis:    Component Value Date/Time   COLORURINE YELLOW 09/05/2015 0958   APPEARANCEUR CLEAR 09/05/2015 0958   LABSPEC 1.034 (H) 09/05/2015 0958   PHURINE 5.0 09/05/2015 0958   GLUCOSEU >1000 (A) 09/05/2015 0958   HGBUR NEGATIVE 09/05/2015 0958   BILIRUBINUR NEGATIVE 09/05/2015 0958   KETONESUR NEGATIVE 09/05/2015 0958   PROTEINUR NEGATIVE 09/05/2015 0958   UROBILINOGEN 0.2 09/27/2014 2237   NITRITE NEGATIVE 09/05/2015 0958   LEUKOCYTESUR NEGATIVE 09/05/2015 0958   Sepsis Labs: @LABRCNTIP (procalcitonin:4,lacticidven:4) )No results found for this or any previous visit (from the past 240 hour(s)).   Radiological Exams on Admission: No results found.  EKG: Not performed.   Assessment/Plan  1. Right hand cellulitis  - Pt presents with right hand swelling, pain, and redness that began over the dorsal hand on 7/28 and has now extended proximally up the dorsal forearm despite treatment with Keflex and Bactrim, though only for 1 day  - Reassuringly, there is no fever or lactate elevation and leukocytosis has resolved  - Given the apparent worsening with appropriate outpatient treatment, and his comorbid conditions such as poorly-controlled DM, will plan to observe in hospital for treatment with IV Rocephin  - Start IV Rocephin, elevate the hand, return to oral therapy once improving     2. Type II DM  - A1c was 12.4% in August 2017  - Managed at home with glipizide and metformin  - Check CBG's with meals and qHS - Will use a low-intensity Novolog sliding-scale while in hospital    3. CAD  - Hx of stents to LAD and RCA, diffuse disease on most recent cath (at [facility] medically-managed  - No anginal complaints  - Continue ASA 81, Plavix, Lipitor, lisinopril, and metoprolol    4. Hypertension - BP is at goal  - Continue lisinopril, Toprol   5. CKD stage II  - SCr is 1.25 on admission, consistent with baseline   6. Chronic pain - Pt suffers chronic low back and knee pain  - Stable  - Continue home regimen with prn Percocet, Voltaren gel, and Robaxin    DVT prophylaxis: sq Lovenox  Code Status: Full  Family Communication: Discussed with patient Disposition Plan: Observe on med-surg unit  Consults called: None Admission status: Observation    Vianne Bulls, MD [facility] Pager [phone]  If 7PM-7AM, please contact night-coverage [email or web address] Password [facility]  08/02/2016, 5:31 PM

## 2016-08-02 NOTE — ED Provider Notes (Signed)
[place] DEPT Provider Note   CSN: [id number] Arrival date & time: 08/02/16  1142     History   Chief Complaint Chief Complaint  Patient presents with  . Wrist Pain    HPI [name] is a 61 y.o. male.  HPI Pt was seen at 1510. Per pt and his family, c/o gradual onset and worsening of persistent right dorsal hand "redness" that began 3 days ago. Pt was evaluated in the ED yesterday for same, dx cellulitis, given a dose of antibiotics in the ED and rx keflex and bactrim. Pt states he filled the antibiotics and has been taking them as prescribed. Pt states the redness to his hand is "getting worse" and now his hand is "swelling." States the redness is now extending to his dorsal wrist. Pt and family state they were told to come back today "and get admitted if it wasn't getting better." Denies fevers, no injury, no focal motor weakness, no tingling/numbness in extremities.    Past Medical History:  Diagnosis Date  . CAD (coronary artery disease) 8\4\1324   Cardiac cath at [facility]. # vessel CAD, with patent stent in the PLAD and RCA. Diffuse dLAD, OM2, and  RPDA disease. No aortic stenosis. Elevated LVEDP. No lesion to suggest ACS. Continue medical managment.   . CHF (congestive heart failure) ([place])   . Coronary artery disease   . Diabetes mellitus without complication ([place])   . DM2 (diabetes mellitus, type 2) ([place])   . HTN (hypertension)   . Hyperlipemia   . Hyperlipidemia   . Hypertension   . MI, old   . Obesity   . Tobacco use     Patient Active Problem List   Diagnosis Date Noted  . AKI (acute kidney injury) ([place]) 01/31/2016  . Lobar pneumonia ([place]) 01/31/2016  . Influenza A 01/31/2016  . Diarrhea 01/30/2016  . NSVT (nonsustained ventricular tachycardia) ([place]) 09/12/2015  . Precordial chest pain 09/10/2015  . Cocaine use 09/04/2015  . Near syncope 06/29/2015  . Essential hypertension 06/29/2015  . History of heart artery stents 06/29/2015  .  Insulin dependent diabetes mellitus ([place]) 06/29/2015  . History of MI (myocardial infarction) 06/29/2015  . Chronic systolic CHF 40/10/2723  . Musculoskeletal chest pain 05/27/2015  . Rectal bleeding   . Stage 3 chronic renal impairment associated with type 2 diabetes mellitus ([place]) 09/09/2013  . 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 CAD- residual RCA and OM disease on re-look cath 09/07/13 09/09/2013  . Cardiomyopathy, ischemic-EF 40-45% by echo 09/07/13 09/09/2013  . S/P LAD DES June 2014 09/06/2013  . Tobacco use disorder 09/06/2013  . Hyperglycemia 08/22/2012  . NSTEMI (non-ST elevated myocardial infarction) ([place]) 07/07/2012  . Dyslipidemia 07/07/2012  . Hypertensive heart disease     Past Surgical History:  Procedure Laterality Date  . CARDIAC CATHETERIZATION N/A 09/07/2015   Procedure: Left Heart Cath and Coronary Angiography;  Surgeon: Leonie Man, MD;  Location: [place] CV LAB;  Service: Cardiovascular;  Laterality: N/A;  . CARDIAC CATHETERIZATION N/A 09/07/2015   Procedure: Coronary Stent Intervention;  Surgeon: Leonie Man, MD;  Location: [place] CV LAB;  Service: Cardiovascular;  Laterality: N/A;  . CORONARY ANGIOGRAM  09/07/13   residual RCA and OM disease  . CORONARY ANGIOPLASTY WITH STENT PLACEMENT    . LEFT HEART CATH Bilateral 07/08/2012   Procedure: LEFT HEART CATH;  Surgeon: [name], MD;  Location: [facility] CATH LAB;  Service: Cardiovascular;  Laterality:  Bilateral;  . LEFT HEART CATHETERIZATION WITH CORONARY ANGIOGRAM N/A 09/06/2013   STEMI, 2nd ISR LAD. Procedure: LEFT HEART CATHETERIZATION WITH CORONARY ANGIOGRAM;  Surgeon: [name], MD;  Location: [facility] CATH LAB;  Service: Cardiovascular;  Laterality: N/A;  . PERCUTANEOUS CORONARY STENT INTERVENTION (PCI-S)  07/08/2012   Procedure: PERCUTANEOUS CORONARY STENT INTERVENTION (PCI-S);  Surgeon: [name], MD;  Location: [facility] CATH LAB;  Service: Cardiovascular;;  DES LAD    . PERCUTANEOUS CORONARY STENT INTERVENTION (PCI-S) N/A 09/06/2013   Procedure: PERCUTANEOUS CORONARY STENT INTERVENTION (PCI-S);  Surgeon: [name], MD;  Location: [facility] CATH LAB;  Service: Cardiovascular;  Laterality: N/A;  Mid LAD 3.0/24mm Promus       Home Medications    Prior to Admission medications   Medication Sig Start Date End Date Taking? Authorizing Provider  acetaminophen (TYLENOL) 325 MG tablet Take 2 tablets (650 mg total) by mouth every 4 (four) hours as needed for headache or mild pain. 09/09/13   [name], PA-C  aspirin EC 81 MG tablet Take 81 mg by mouth daily.     [provider]  atorvastatin (LIPITOR) 80 MG tablet Take 80 mg by mouth daily.    [provider]  cephALEXin (KEFLEX) 500 MG capsule Take 1 capsule (500 mg total) by mouth 4 (four) times daily. 08/01/16   Fransico Meadow, PA-C  clopidogrel (PLAVIX) 75 MG tablet Take 75 mg by mouth daily.    [provider]  diclofenac sodium (VOLTAREN) 1 % GEL Apply 4 g topically 4 (four) times daily.    [provider]  docusate sodium (COLACE) 100 MG capsule Take 100 mg by mouth 2 (two) times daily.    [provider]  glipiZIDE (GLUCOTROL) 5 MG tablet Take 5 mg by mouth 2 (two) times daily.    [provider]  HYDROcodone-acetaminophen (NORCO/VICODIN) 5-325 MG tablet Take 2 tablets by mouth every 4 (four) hours as needed. 07/29/16   Fransico Meadow, PA-C  hydrocortisone cream 0.5 % Apply 1 application topically 2 (two) times daily.    [provider]  isosorbide mononitrate (IMDUR) 30 MG 24 hr tablet Take 1 tablet (30 mg total) by mouth daily. 09/24/15 05/24/16  Lendon Colonel, NP  lisinopril (PRINIVIL,ZESTRIL) 2.5 MG tablet Take 2.5 mg by mouth daily.    [provider]  metFORMIN (GLUCOPHAGE) 1000 MG tablet Take 1 tablet (1,000 mg total) by mouth 2 (two) times daily with a meal. Do not restart Metformin until Wednesday am 7/2 Patient  taking differently: Take 1,000 mg by mouth 2 (two) times daily.  07/10/12   [name], MD  methocarbamol (ROBAXIN) 500 MG tablet Take 1 tablet (500 mg total) by mouth 2 (two) times daily. 07/29/16   Fransico Meadow, PA-C  metoprolol succinate (TOPROL-XL) 25 MG 24 hr tablet Take 25 mg by mouth daily.    [provider]  nitroGLYCERIN (NITROSTAT) 0.4 MG SL tablet Place 1 tablet (0.4 mg total) under the tongue every 5 (five) minutes x 3 doses as needed for chest pain. 05/03/14   Thurnell Lose, MD  oxyCODONE-acetaminophen (PERCOCET/ROXICET) 5-325 MG tablet Take 2 tablets by mouth every 4 (four) hours as needed for severe pain. 08/01/16   Fransico Meadow, PA-C  sulfamethoxazole-trimethoprim (BACTRIM DS,SEPTRA DS) 800-160 MG tablet Take 1 tablet by mouth 2 (two) times daily. 08/01/16 08/08/16  Fransico Meadow, PA-C  ticagrelor (BRILINTA) 90 MG TABS tablet Take 90 mg by mouth 2 (two)  times daily.    [provider]  Vitamin D, Ergocalciferol, (DRISDOL) 50000 units CAPS capsule Take 50,000 Units by mouth every 7 (seven) days.    [provider]    Family History Family History  Problem Relation Age of Onset  . Hypertension Mother   . Diabetes Mother   . Hypertension Father   . Diabetes Father   . Hypertension Sister   . Diabetes Sister     Social History Social History  Substance Use Topics  . Smoking status: Current Every Day Smoker    Packs/day: 0.50    Types: Cigarettes  . Smokeless tobacco: Never Used  . Alcohol use Yes     Comment: occ     Allergies   Patient has no known allergies.   Review of Systems Review of Systems ROS: Statement: All systems negative except as marked or noted in the HPI; Constitutional: Negative for fever and chills. ; ; Eyes: Negative for eye pain, redness and discharge. ; ; ENMT: Negative for ear pain, hoarseness, nasal congestion, sinus pressure and sore throat. ; ; Cardiovascular: Negative for chest pain, palpitations,  diaphoresis, dyspnea and peripheral edema. ; ; Respiratory: Negative for cough, wheezing and stridor. ; ; Gastrointestinal: Negative for nausea, vomiting, diarrhea, abdominal pain, blood in stool, hematemesis, jaundice and rectal bleeding. . ; ; Genitourinary: Negative for dysuria, flank pain and hematuria. ; ; Musculoskeletal: Negative for back pain and neck pain. Negative for deformity and trauma.; ; Skin: +rash, swelling. Negative for pruritus, abrasions, blisters, bruising and skin lesion.; ; Neuro: Negative for headache, lightheadedness and neck stiffness. Negative for weakness, altered level of consciousness, altered mental status, extremity weakness, paresthesias, involuntary movement, seizure and syncope.       Physical Exam Updated Vital Signs BP 120/77 (BP Location: Left Arm)   Pulse 74   Temp 98.2 F (36.8 C) (Oral)   Resp 16   Ht 5\' 7"  (1.702 m)   Wt 63.5 kg (140 lb)   SpO2 97%   BMI 21.93 kg/m   Physical Exam 1515: Physical examination:  Nursing notes reviewed; Vital signs and O2 SAT reviewed;  Constitutional: Well developed, Well nourished, Well hydrated, In no acute distress; Head:  Normocephalic, atraumatic; Eyes: EOMI, PERRL, No scleral icterus; ENMT: Mouth and pharynx normal, Mucous membranes moist; Neck: Supple, Full range of motion, No lymphadenopathy; Cardiovascular: Regular rate and rhythm, No gallop; Respiratory: Breath sounds clear & equal bilaterally, No wheezes.  Speaking full sentences with ease, Normal respiratory effort/excursion; Chest: Nontender, Movement normal; Abdomen: Soft, Nontender, Nondistended, Normal bowel sounds; Genitourinary: No CVA tenderness; Extremities: Pulses normal, +right dorsal hand edema and erythema extending from proximal fingers to wrist and distal forearm. No open wounds, no ecchymosis. No deformity.; Neuro: AA&Ox3, vague historian. Major CN grossly intact.  Speech clear. No gross focal motor or sensory deficits in extremities.; Skin: Color  normal, Warm, Dry.   ED Treatments / Results  Labs (all labs ordered are listed, but only abnormal results are displayed)   EKG  EKG Interpretation None       Radiology   Procedures Procedures (including critical care time)  Medications Ordered in ED Medications  clindamycin (CLEOCIN) IVPB 600 mg (not administered)     Initial Impression / Assessment and Plan / ED Course  I have reviewed the triage vital signs and the nursing notes.  Pertinent labs & imaging results that were available during my care of the patient were reviewed by me and considered in my medical decision making (  see chart for details).  MDM Reviewed: previous chart, nursing note and vitals Reviewed previous: labs Interpretation: labs   Results for orders placed or performed during the hospital encounter of 11/65/79  Basic metabolic panel  Result Value Ref Range   Sodium 136 135 - 145 mmol/L   Potassium 3.7 3.5 - 5.1 mmol/L   Chloride 102 101 - 111 mmol/L   CO2 28 22 - 32 mmol/L   Glucose, Bld 130 (H) 65 - 99 mg/dL   BUN 14 6 - 20 mg/dL   Creatinine, Ser 1.25 (H) 0.61 - 1.24 mg/dL   Calcium 9.0 8.9 - 10.3 mg/dL   GFR calc non Af Amer >60 >60 mL/min   GFR calc Af Amer >60 >60 mL/min   Anion gap 6 5 - 15  Lactic acid, plasma  Result Value Ref Range   Lactic Acid, Venous 1.0 0.5 - 1.9 mmol/L  CBC with Differential  Result Value Ref Range   WBC 8.6 4.0 - 10.5 K/uL   RBC 5.44 4.22 - 5.81 MIL/uL   Hemoglobin 14.0 13.0 - 17.0 g/dL   HCT 43.0 39.0 - 52.0 %   MCV 79.0 78.0 - 100.0 fL   MCH 25.7 (L) 26.0 - 34.0 pg   MCHC 32.6 30.0 - 36.0 g/dL   RDW 17.5 (H) 11.5 - 15.5 %   Platelets 264 150 - 400 K/uL   Neutrophils Relative % 69 %   Neutro Abs 5.9 1.7 - 7.7 K/uL   Lymphocytes Relative 20 %   Lymphs Abs 1.7 0.7 - 4.0 K/uL   Monocytes Relative 5 %   Monocytes Absolute 0.4 0.1 - 1.0 K/uL   Eosinophils Relative 6 %   Eosinophils Absolute 0.6 0.0 - 0.7 K/uL   Basophils Relative 0 %    Basophils Absolute 0.0 0.0 - 0.1 K/uL    1725:   Pt and family state right hand redness and swelling worsening despite taking abx and their expectation is that pt will be admitted today. IV clindamycin given. T/C to [facility] Dr. Myna Hidalgo, case discussed, including:  HPI, pertinent PM/SHx, VS/PE, dx testing, ED course and treatment:  Agreeable to admit.   Final Clinical Impressions(s) / ED Diagnoses   Final diagnoses:  None    New Prescriptions New Prescriptions   No medications on file     Francine Graven, DO 08/05/16 1511

## 2016-08-02 NOTE — ED Triage Notes (Signed)
Pt returns for recheck of right wrist, pt states he understood if this was no better today he would be admitted.

## 2016-08-03 DIAGNOSIS — E1149 Type 2 diabetes mellitus with other diabetic neurological complication: Secondary | ICD-10-CM | POA: Diagnosis not present

## 2016-08-03 DIAGNOSIS — I10 Essential (primary) hypertension: Secondary | ICD-10-CM | POA: Diagnosis not present

## 2016-08-03 DIAGNOSIS — L03113 Cellulitis of right upper limb: Secondary | ICD-10-CM | POA: Diagnosis not present

## 2016-08-03 DIAGNOSIS — I5022 Chronic systolic (congestive) heart failure: Secondary | ICD-10-CM

## 2016-08-03 DIAGNOSIS — N182 Chronic kidney disease, stage 2 (mild): Secondary | ICD-10-CM | POA: Diagnosis not present

## 2016-08-03 LAB — CBC WITH DIFFERENTIAL/PLATELET
BASOS PCT: 1 %
Basophils Absolute: 0 10*3/uL (ref 0.0–0.1)
Eosinophils Absolute: 0.6 10*3/uL (ref 0.0–0.7)
Eosinophils Relative: 7 %
HEMATOCRIT: 43.6 % (ref 39.0–52.0)
Hemoglobin: 13.8 g/dL (ref 13.0–17.0)
Lymphocytes Relative: 22 %
Lymphs Abs: 1.8 10*3/uL (ref 0.7–4.0)
MCH: 25.3 pg — AB (ref 26.0–34.0)
MCHC: 31.7 g/dL (ref 30.0–36.0)
MCV: 80 fL (ref 78.0–100.0)
MONO ABS: 0.3 10*3/uL (ref 0.1–1.0)
MONOS PCT: 4 %
NEUTROS ABS: 5.6 10*3/uL (ref 1.7–7.7)
Neutrophils Relative %: 66 %
Platelets: 256 10*3/uL (ref 150–400)
RBC: 5.45 MIL/uL (ref 4.22–5.81)
RDW: 16.7 % — ABNORMAL HIGH (ref 11.5–15.5)
WBC: 8.4 10*3/uL (ref 4.0–10.5)

## 2016-08-03 LAB — GLUCOSE, CAPILLARY
GLUCOSE-CAPILLARY: 170 mg/dL — AB (ref 65–99)
GLUCOSE-CAPILLARY: 127 mg/dL — AB (ref 65–99)
Glucose-Capillary: 143 mg/dL — ABNORMAL HIGH (ref 65–99)
Glucose-Capillary: 143 mg/dL — ABNORMAL HIGH (ref 65–99)

## 2016-08-03 LAB — BASIC METABOLIC PANEL
ANION GAP: 9 (ref 5–15)
BUN: 15 mg/dL (ref 6–20)
CALCIUM: 8.6 mg/dL — AB (ref 8.9–10.3)
CO2: 28 mmol/L (ref 22–32)
Chloride: 99 mmol/L — ABNORMAL LOW (ref 101–111)
Creatinine, Ser: 1.27 mg/dL — ABNORMAL HIGH (ref 0.61–1.24)
GFR calc Af Amer: >60 mL/min (ref >60)
GFR calc non Af Amer: 59 mL/min — ABNORMAL LOW (ref >60)
GLUCOSE: 139 mg/dL — AB (ref 65–99)
Potassium: 3.8 mmol/L (ref 3.5–5.1)
Sodium: 136 mmol/L (ref 135–145)

## 2016-08-03 LAB — HEMOGLOBIN A1C
Hgb A1c MFr Bld: 9.2 % — ABNORMAL HIGH (ref 4.8–5.6)
MEAN PLASMA GLUCOSE: 217 mg/dL

## 2016-08-03 MED ORDER — INSULIN DETEMIR 100 UNIT/ML ~~LOC~~ SOLN
5.0000 [IU] | Freq: Every day | SUBCUTANEOUS | Status: DC
  Administered 2016-08-03: 5 [IU] via SUBCUTANEOUS
  Filled 2016-08-03 (×2): qty 0.05

## 2016-08-03 MED ORDER — NICOTINE 14 MG/24HR TD PT24
14.0000 mg | MEDICATED_PATCH | Freq: Every day | TRANSDERMAL | Status: DC
  Administered 2016-08-03 – 2016-08-04 (×2): 14 mg via TRANSDERMAL
  Filled 2016-08-03 (×3): qty 1

## 2016-08-03 MED ORDER — INSULIN DETEMIR 100 UNIT/ML ~~LOC~~ SOLN
5.0000 [IU] | Freq: Two times a day (BID) | SUBCUTANEOUS | Status: DC
  Administered 2016-08-03 – 2016-08-04 (×2): 5 [IU] via SUBCUTANEOUS
  Filled 2016-08-03 (×4): qty 0.05

## 2016-08-03 MED ORDER — MAGNESIUM CITRATE PO SOLN
1.0000 | Freq: Once | ORAL | Status: AC
  Administered 2016-08-03: 1 via ORAL
  Filled 2016-08-03: qty 296

## 2016-08-03 MED ORDER — NICOTINE 14 MG/24HR TD PT24
14.0000 mg | MEDICATED_PATCH | Freq: Once | TRANSDERMAL | Status: AC
  Administered 2016-08-03: 14 mg via TRANSDERMAL

## 2016-08-03 MED ORDER — TICAGRELOR 90 MG PO TABS
90.0000 mg | ORAL_TABLET | Freq: Two times a day (BID) | ORAL | Status: DC
  Administered 2016-08-03: 90 mg via ORAL
  Filled 2016-08-03 (×3): qty 1

## 2016-08-03 NOTE — Progress Notes (Signed)
[facility] PROGRESS NOTE    Progress Note  [name]  WUX:[id number] DOB: [birth year] DOA: 08/02/2016 PCP: [name], MD     Brief Narrative:   [name] is an 61 y.o. male past medical history of uncontrolled diabetes with a step 2, coronary artery sees, chronic and easy stage II, hypertension comes to the ED for pain swelling and redness of the right hand.  Assessment/Plan:   Cellulitis of right hand: Started on IV Rocephin on admission. Has remained afebrile with no leukocytosis, he has no pain with passive movement. Continue empiric antibiotics for 1 additional day.  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 Blood glucose has been fairly controlled. Continue Lantus plus sliding scale insulin.  CAD- residual RCA and OM disease on re-look cath 09/07/13 No chest pain or shortness of breath, continue aspirin, Effient, Lipitor ACE inhibitor and metoprolol.  Essential hypertension Controlled current antihypertensive regimen.  Chronic systolic CHF Seems to be euvolemic. Continue ACE inhibitor metoprolol  CKD (chronic kidney disease), stage II: Creatinine seems to be at baseline.   DVT prophylaxis: lovenox Family Communication:none Disposition Plan/Barrier to D/C: home hopefully in am Code Status:     Code Status Orders        Start     Ordered   08/02/16 1730  Full code  Continuous     08/02/16 1731    Code Status History    Date Active Date Inactive Code Status Order ID Comments User Context   01/30/2016  6:30 PM 02/03/2016  5:07 PM Full Code [id number]  Truett Mainland, DO Inpatient   09/10/2015  4:55 PM 09/12/2015  3:54 PM Full Code [id number]  Arbutus Leas, NP Inpatient   09/04/2015  4:30 PM 09/08/2015  7:45 PM Full Code [id number]  Erline Hau, MD Inpatient   06/29/2015  7:27 PM 06/30/2015  2:48 PM Full Code [id number]  Roney Jaffe, MD Inpatient   05/27/2015  6:35 PM 05/28/2015  7:08 PM Full Code [id number]   Erline Hau, MD Inpatient   05/01/2014  7:09 PM 05/03/2014  4:02 PM Full Code [id number]  Doree Albee, MD ED   09/07/2013 10:05 AM 09/09/2013  4:19 PM Full Code [id number]  Sherren Mocha, MD Inpatient   09/06/2013  4:40 PM 09/07/2013 10:05 AM Full Code [id number]  [name], MD Inpatient        IV Access:    Peripheral IV   Procedures and diagnostic studies:   No results found.   Medical Consultants:    None.  Anti-Infectives:   IV Rocephin  Subjective:    [name] he relates his hand is better denies any fever or chills tolerating his diet.  Objective:    Vitals:   08/02/16 1825 08/02/16 1843 08/02/16 2044 08/03/16 0457  BP: 109/75 111/63 116/73 113/79  Pulse: 70 71 71 63  Resp: 16 18 20 20   Temp:  98.1 F (36.7 C) 97.7 F (36.5 C) 97.6 F (36.4 C)  TempSrc:  Oral Oral Oral  SpO2: 100% 95% 97% 99%  Weight:      Height:        Intake/Output Summary (Last 24 hours) at 08/03/16 0958 Last data filed at 08/03/16 7616  Gross per 24 hour  Intake               50 ml  Output  0 ml  Net               50 ml   Filed Weights   08/02/16 1206  Weight: 63.5 kg (140 lb)    Exam: General exam: In no acute distress Respiratory system: Good air movement CTA B/L Cardiovascular system: S1 & S2, RRR. Gastrointestinal system: + BS, NT, ND, Soft Central nervous system: A&O x3 Extremities: lower ext edema Skin: No rashes Psychiatry: Judgment and insight appear normal.   Data Reviewed:    Labs: Basic Metabolic Panel:  Recent Labs Lab 08/01/16 1759 08/02/16 1519 08/03/16 0614  NA 138 136 136  K 3.8 3.7 3.8  CL 102 102 99*  CO2 27 28 28   GLUCOSE 128* 130* 139*  BUN 15 14 15   CREATININE 1.24 1.25* 1.27*  CALCIUM 8.9 9.0 8.6*   GFR Estimated Creatinine Clearance: 54.2 mL/min (A) (by C-G formula based on SCr of 1.27 mg/dL (H)). Liver Function Tests:  Recent Labs Lab 08/01/16 1759  AST 11*  ALT 12*    ALKPHOS 96  BILITOT 0.4  PROT 7.8  ALBUMIN 3.8   No results for input(s): LIPASE, AMYLASE in the last 168 hours. No results for input(s): AMMONIA in the last 168 hours. Coagulation profile No results for input(s): INR, PROTIME in the last 168 hours.  CBC:  Recent Labs Lab 08/01/16 1759 08/02/16 1519 08/03/16 0614  WBC 10.9* 8.6 8.4  NEUTROABS 7.9* 5.9 5.6  HGB 14.4 14.0 13.8  HCT 44.7 43.0 43.6  MCV 79.7 79.0 80.0  PLT 269 264 256   Cardiac Enzymes: No results for input(s): CKTOTAL, CKMB, CKMBINDEX, TROPONINI in the last 168 hours. BNP (last 3 results) No results for input(s): PROBNP in the last 8760 hours. CBG:  Recent Labs Lab 07/29/16 1916 08/02/16 2043 08/03/16 0721  GLUCAP 111* 242* 143*   D-Dimer: No results for input(s): DDIMER in the last 72 hours. Hgb A1c:  Recent Labs  08/02/16 1554  HGBA1C 9.2*   Lipid Profile: No results for input(s): CHOL, HDL, LDLCALC, TRIG, CHOLHDL, LDLDIRECT in the last 72 hours. Thyroid function studies: No results for input(s): TSH, T4TOTAL, T3FREE, THYROIDAB in the last 72 hours.  Invalid input(s): FREET3 Anemia work up: No results for input(s): VITAMINB12, FOLATE, FERRITIN, TIBC, IRON, RETICCTPCT in the last 72 hours. Sepsis Labs:  Recent Labs Lab 08/01/16 1759 08/02/16 1519 08/03/16 0614  WBC 10.9* 8.6 8.4  LATICACIDVEN  --  1.0  --    Microbiology No results found for this or any previous visit (from the past 240 hour(s)).   Medications:   . aspirin EC  81 mg Oral Daily  . atorvastatin  80 mg Oral q1800  . clopidogrel  75 mg Oral Daily  . diclofenac sodium  4 g Topical QID  . docusate sodium  100 mg Oral BID  . enoxaparin (LOVENOX) injection  40 mg Subcutaneous Q24H  . insulin aspart  0-5 Units Subcutaneous QHS  . insulin aspart  0-9 Units Subcutaneous TID WC  . lisinopril  2.5 mg Oral Daily  . methocarbamol  500 mg Oral BID  . metoprolol succinate  25 mg Oral Daily  . nicotine  14 mg Transdermal  Daily  . sodium chloride flush  3 mL Intravenous Q12H   Continuous Infusions: . sodium chloride    . cefTRIAXone (ROCEPHIN)  IV Stopped (08/02/16 2336)      LOS: 0 days   Charlynne Cousins  [facility] Pager [phone]  *Please refer to  [email or web address], password [email or web address] to get updated schedule on who will round on this patient, as hospitalists switch teams weekly. If 7PM-7AM, please contact night-coverage at [email or web address], password [email or web address] for any overnight needs.  08/03/2016, 9:58 AM

## 2016-08-03 NOTE — Progress Notes (Addendum)
Inpatient Diabetes Program Recommendations  AACE/ADA: New Consensus Statement on Inpatient Glycemic Control (2015)  Target Ranges:  Prepandial:   less than 140 mg/dL      Peak postprandial:   less than 180 mg/dL (1-2 hours)      Critically ill patients:  140 - 180 mg/dL   Results for WILLET, SCHLEIFER (MRN [id number]) as of 08/03/2016 12:20  Ref. Range 07/29/2016 19:16 08/02/2016 20:43 08/03/2016 07:21 08/03/2016 11:37  Glucose-Capillary Latest Ref Range: 65 - 99 mg/dL 111 (H) 242 (H) 143 (H) 170 (H)   Results for LAMONE, FERRELLI (MRN [id number]) as of 08/03/2016 12:20  Ref. Range 09/04/2015 10:02 08/02/2016 15:54  Hemoglobin A1C Latest Ref Range: 4.8 - 5.6 % 12.4 (H) 9.2 (H)   Review of Glycemic Control  Diabetes history: DM2 Outpatient Diabetes medications: Metformin 1000 mg BID, Glipizide 5 mg BID Current orders for Inpatient glycemic control: Levemir 5 units daily, Novolog 0-9 units TID with meals, Novolog 0-5 units QHS  Inpatient Diabetes Program Recommendations: HgbA1C: A1C 9.2% on 08/02/16 indicating an average glucose of 223 mg/dl over the past 2-3 months. Talked with patient and encouraged him to take DM medications consistently and monitor glucose.  NOTE: Spoke with patient about diabetes and home regimen for diabetes control. Patient reports that he is followed by [facility] for diabetes management and he has had DM for several years. Patient reports that he is prescribed Metformin 1000 mg BID and Glipizide 5 mg BID as an outpatient for diabetes control. Patient reports that he also has Lantus at home that he uses from time to time (has used once in the past 2 weeks) if he checks his glucose and it is higher than 250 mg/dl. Patient admits that he is NOT taking any of his DM medications as prescribed. He reports that he tries to take Metformin once everyday but he states if he is feeling okay he skips taking the Metformin. Patient states that he is NOT taking the Glipizide at all because "it makes  me feel sick" .  In further talking with patient he states that by saying it make him sick he means it makes him feel nauseated, weak, and just feel lousy.  Patient states that he does not consistently check his glucose because he does not like to stick himself but his daughter will check it sometimes.  Discussed A1C results (9.2% on 08/02/16) and explained that his current A1C indicates an average glucose of 223 mg/dl over the past 2-3 months. Explained that his current A1C is better than the 12.4% in August 2017 but stressed importance of improving DM control.  Discussed glucose and A1C goals. Discussed importance of checking CBGs and maintaining good CBG control to prevent long-term and short-term complications. Explained how hyperglycemia leads to damage within blood vessels which lead to the common complications seen with uncontrolled diabetes. Stressed to the patient the importance of improving glycemic control to prevent further complications from uncontrolled diabetes. Discussed impact of nutrition, exercise, stress, sickness, and medications on diabetes control. Patient admits that he drinks a regular pepsi from time to time and that he does eat snack cakes when he feels like he wants one.  Talked with patient about DM medications prescribed and explained that medications need to be taken consistently in order for them to work properly and improve DM control. Inquired about what patient is willing to do to improve DM control. Patient states that he does not want to take insulin because he does not want to  stick himself; he does not want to take Glipizide because he feels it makes him feel bad; but he is willing to try to Metformin consistently everyday. Discussed FreeStyle Libre (flash glucose monitoring device; a sensor is placed every 10 days and the patient would use a reader device and scan it over the sensor and it provides a glucose reading) and patient states that he would use this type of device to  check his glucose more often if he could get it. Patient requested MD provide a prescription for the [facility] Reader and Sensors (3) at time of discharge (order numbers [id number] and [id number]). Encouraged patient to do his best to improve DM control, check glucose, and take medications consistently.   Patient verbalized understanding of information discussed and he states that he has no further questions at this time related to diabetes.  Thanks, Barnie Alderman, RN, MSN, CDE Diabetes Coordinator Inpatient Diabetes Program [phone] (Team Pager)

## 2016-08-03 NOTE — Progress Notes (Signed)
Patient attempting to leave the floor to go smoke. Explained that our facility was tobacco free and that smoking was not allowed on the premises. Dr. Marin Comment was paged and orders given for Nicotine Patch. Patient verbalized understanding but was disgruntled about our No Smoking Policy.

## 2016-08-03 NOTE — Care Management Obs Status (Signed)
[place] NOTIFICATION   Patient Details  Name: [name] MRN: [id number] Date of Birth: February 15, 1955   Medicare Observation Status Notification Given:  Yes    Daylani Deblois, Chauncey Reading, RN 08/03/2016, 9:31 AM

## 2016-08-03 NOTE — Care Management Note (Signed)
Case Management Note  Patient Details  Name: [name] MRN: [id number] Date of Birth: 1955/11/01  Subjective/Objective:    Adm with cellulitis. From home, ind PTA. No HH or DME. Has PCP, still drives to appointments. No issues affording medication.              Action/Plan: Anticipate DC home with self care.   Expected Discharge Date:      08/04/2016            Expected Discharge Plan:  Home/Self Care  In-House Referral:     Discharge planning Services  CM Consult  Post Acute Care Choice:  NA Choice offered to:  NA  DME Arranged:    DME Agency:     HH Arranged:    HH Agency:     Status of Service:  Completed, signed off  If discussed at [employer] of Stay Meetings, dates discussed:    Additional Comments:  [name], Chauncey Reading, RN 08/03/2016, 9:32 AM

## 2016-08-04 DIAGNOSIS — E1149 Type 2 diabetes mellitus with other diabetic neurological complication: Secondary | ICD-10-CM | POA: Diagnosis not present

## 2016-08-04 DIAGNOSIS — I10 Essential (primary) hypertension: Secondary | ICD-10-CM | POA: Diagnosis not present

## 2016-08-04 DIAGNOSIS — L03113 Cellulitis of right upper limb: Secondary | ICD-10-CM | POA: Diagnosis not present

## 2016-08-04 DIAGNOSIS — N182 Chronic kidney disease, stage 2 (mild): Secondary | ICD-10-CM | POA: Diagnosis not present

## 2016-08-04 DIAGNOSIS — I5022 Chronic systolic (congestive) heart failure: Secondary | ICD-10-CM | POA: Diagnosis not present

## 2016-08-04 LAB — GLUCOSE, CAPILLARY
Glucose-Capillary: 121 mg/dL — ABNORMAL HIGH (ref 65–99)
Glucose-Capillary: 150 mg/dL — ABNORMAL HIGH (ref 65–99)

## 2016-08-04 LAB — HIV ANTIBODY (ROUTINE TESTING W REFLEX): HIV Screen 4th Generation wRfx: NONREACTIVE

## 2016-08-04 MED ORDER — CEPHALEXIN 500 MG PO CAPS: 500.0000 mg | ORAL_CAPSULE | Freq: Three times a day (TID) | ORAL | Status: AC

## 2016-08-04 MED ORDER — CEPHALEXIN 500 MG PO CAPS: 500.0000 mg | ORAL_CAPSULE | Freq: Four times a day (QID) | ORAL | Status: DC

## 2016-08-04 MED ORDER — FREESTYLE LIBRE SENSOR SYSTEM MISC: 1.0000 | 6 refills | Status: DC

## 2016-08-04 MED ORDER — FREESTYLE LIBRE READER DEVI: 1.0000 | Freq: Every day | 6 refills | Status: DC

## 2016-08-04 NOTE — Progress Notes (Signed)
Patient is to be discharged home and in stable condition. Patient given discharge instructions and verbalized understanding. Patient given prescriptions. All questions answered. Patient is awaiting transportation at this time and will be escorted out by staff via wheelchair upon their arrival.  Celestia Khat, RN

## 2016-08-04 NOTE — Discharge Summary (Signed)
Physician Discharge Summary  [name] KGU:[id number] DOB: Sep 28, 1955 DOA: 08/02/2016  PCP: [name], MD  Admit date: 08/02/2016 Discharge date: 08/04/2016  Time spent: Greater than 30 minutes  Recommendations for Outpatient Follow-up:  1. Recommend follow-up of medication compliance.     Discharge Diagnoses:  1. Cellulitis of the right hand. 2. 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 3. Noncompliance. 4. CAD with residual RCA and OM disease on relook cath 09/07/13. 5. Essential hypertension. 6. Chronic systolic CHF. 7. Stage II CKD  Discharge Condition: Improved.  Diet recommendation: Heart healthy/carbohydrate modified.  Filed Weights   08/02/16 1206  Weight: 63.5 kg (140 lb)    History of present illness:  [name] is a 61 y.o. right-handed male with medical history significant for poorly controlled type 2 diabetes mellitus, coronary artery disease with stents, chronic kidney disease stage II, hypertension, and chronic low back and knee pain, who presented to the ED for evaluation of pain, swelling, and redness involving the right hand. There had not been any injury or trauma to the site and he had never experienced similar symptoms previously. He came into the emergency department on the day before admission and was diagnosed with cellulitis, then subsequently discharged home on oral Bactrim and Keflex. He was instructed to return should the condition worsen. He reported compliance with the antibiotics, but noted increased pain, swelling, and redness, as well as migration proximally up the dorsal forearm and around to the palmar wrist. He also had increased difficulty making a fist. He was admitted for management of right hand cellulitis.  Hospital Course:   1. Right hand cellulitis. The patient was started on IV Rocephin.  Keflex and Bactrim were held. Supportive treatment was given. Over the course of the hospitalization, the cellulitis nearly  completely resolved. He remained afebrile. His white blood cell count remained within normal limits. -He was discharged on Keflex to continue the course that had been previously described by the ED physician. The patient was instructed to discontinue the Bactrim, but he stated that he would likely take it anyway.  Type 2 diabetes. The patient is treated with glipizide and metformin chronically. He admitted being noncompliant with glipizide and half compliant with metformin. His hemoglobin A1c was 12.4 in August 2017. -His oral medications were withheld during the hospitalization and he was started on sliding scale NovoLog. His CBGs were reasonably controlled. -A1c during this hospitalization was 9.2, improved. -The diabetes cord nadir was consulted and provided recommendations. The patient requested a prescription for glucometer that would not require fingersticks. It was granted. -Patient was encouraged to be more compliant with his medication regimen. Will defer further management to his PCP.  CAD/hypertension. Patient has a history of CAD and stenting. He is medically managed at the [place] in [place]. -He had no complaints of chest pain. His blood pressure was well controlled and he remained hemodynamically stable. -He was continued on aspirin, Plavix, Lipitor, lisinopril, and metoprolol. At the time of discharge, he stated that he was not taking Plavix, so Brilinta was continued at the time of discharge. -Patient reported intermittent compliance with his medications.  Stage II chronic kidney disease. His creatinine on admission was 1.25, consistent with baseline.  Chronic pain syndrome. The patient has chronic low back pain and chronic knee pain. -He was maintained on his home regimen of as needed Percocet, Voltaren gel, and Robaxin (which he says is not taking at home).  Procedures:  None  Consultations:  None  Discharge Exam:  Vitals:   08/03/16 2059 08/04/16 0502  BP: 111/76  104/62  Pulse: 70 74  Resp: 18 20  Temp: 97.6 F (36.4 C) 97.8 F (36.6 C)    General: Pleasant 61 year old African-American man in no acute distress. Cardiovascular: S1, S2, with a soft systolic murmur. Respiratory: Clear to auscultation bilaterally. Extremities/skin: Right hand with scant global edema; no hand or distal right extremity erythema or warmth; good range of motion of his wrist and hand with flexion and extension. Radial pulse 2+. Nontender.   Discharge Instructions   Discharge Instructions    Diet - low sodium heart healthy    Complete by:  As directed    Diet Carb Modified    Complete by:  As directed    Discharge instructions    Complete by:  As directed    TAKE MEDICATIONS AS DIRECTED.   Increase activity slowly    Complete by:  As directed      Current Discharge Medication List    START taking these medications   Details  cephALEXin (KEFLEX) 500 MG capsule Take 1 capsule (500 mg total) by mouth 3 (three) times daily.    Continuous Blood Gluc Receiver (FREESTYLE LIBRE READER) DEVI 1 packet by Does not apply route daily. Qty: 1 Device, Refills: 6    Continuous Blood Gluc Sensor (FREESTYLE LIBRE SENSOR SYSTEM) MISC 1 Piece by Does not apply route continuous. Qty: 3 each, Refills: 6      CONTINUE these medications which have NOT CHANGED   Details  acetaminophen (TYLENOL) 325 MG tablet Take 2 tablets (650 mg total) by mouth every 4 (four) hours as needed for headache or mild pain.    aspirin EC 81 MG tablet Take 81 mg by mouth daily.     atorvastatin (LIPITOR) 80 MG tablet Take 80 mg by mouth daily.    diclofenac sodium (VOLTAREN) 1 % GEL Apply 4 g topically 4 (four) times daily.    docusate sodium (COLACE) 100 MG capsule Take 100 mg by mouth 2 (two) times daily.    glipiZIDE (GLUCOTROL) 5 MG tablet Take 5 mg by mouth 2 (two) times daily.    hydrocortisone cream 0.5 % Apply 1 application topically 2 (two) times daily.    isosorbide mononitrate  (IMDUR) 30 MG 24 hr tablet Take 1 tablet (30 mg total) by mouth daily. Qty: 30 tablet, Refills: 6    lisinopril (PRINIVIL,ZESTRIL) 2.5 MG tablet Take 2.5 mg by mouth daily.    metFORMIN (GLUCOPHAGE) 1000 MG tablet Take 1 tablet (1,000 mg total) by mouth 2 (two) times daily with a meal. Do not restart Metformin until Wednesday am 7/2    metoprolol succinate (TOPROL-XL) 25 MG 24 hr tablet Take 25 mg by mouth daily.    nitroGLYCERIN (NITROSTAT) 0.4 MG SL tablet Place 1 tablet (0.4 mg total) under the tongue every 5 (five) minutes x 3 doses as needed for chest pain. Qty: 25 tablet, Refills: 12    ticagrelor (BRILINTA) 90 MG TABS tablet Take 90 mg by mouth 2 (two) times daily.    Vitamin D, Ergocalciferol, (DRISDOL) 50000 units CAPS capsule Take 50,000 Units by mouth every 7 (seven) days.      STOP taking these medications     clopidogrel (PLAVIX) 75 MG tablet      HYDROcodone-acetaminophen (NORCO/VICODIN) 5-325 MG tablet      methocarbamol (ROBAXIN) 500 MG tablet      oxyCODONE-acetaminophen (PERCOCET/ROXICET) 5-325 MG tablet        No Known  Allergies Follow-up Information    [name], MD Follow up.   Specialty:  Nurse Practitioner Why:  FOLLOW UP AS SCHEDULED IN [facility] information: [place] [address] [phone]            The results of significant diagnostics from this hospitalization (including imaging, microbiology, ancillary and laboratory) are listed below for reference.    Significant Diagnostic Studies: No results found.  Microbiology: No results found for this or any previous visit (from the past 240 hour(s)).   Labs: Basic Metabolic Panel:  Recent Labs Lab 08/01/16 1759 08/02/16 1519 08/03/16 0614  NA 138 136 136  K 3.8 3.7 3.8  CL 102 102 99*  CO2 27 28 28   GLUCOSE 128* 130* 139*  BUN 15 14 15   CREATININE 1.24 1.25* 1.27*  CALCIUM 8.9 9.0 8.6*   Liver Function Tests:  Recent Labs Lab 08/01/16 1759   AST 11*  ALT 12*  ALKPHOS 96  BILITOT 0.4  PROT 7.8  ALBUMIN 3.8   No results for input(s): LIPASE, AMYLASE in the last 168 hours. No results for input(s): AMMONIA in the last 168 hours. CBC:  Recent Labs Lab 08/01/16 1759 08/02/16 1519 08/03/16 0614  WBC 10.9* 8.6 8.4  NEUTROABS 7.9* 5.9 5.6  HGB 14.4 14.0 13.8  HCT 44.7 43.0 43.6  MCV 79.7 79.0 80.0  PLT 269 264 256   Cardiac Enzymes: No results for input(s): CKTOTAL, CKMB, CKMBINDEX, TROPONINI in the last 168 hours. BNP: BNP (last 3 results) No results for input(s): BNP in the last 8760 hours.  ProBNP (last 3 results) No results for input(s): PROBNP in the last 8760 hours.  CBG:  Recent Labs Lab 08/03/16 1137 08/03/16 1644 08/03/16 2022 08/04/16 0731 08/04/16 1125  GLUCAP 170* 127* 143* 121* 150*       Signed:  Vivek Grealish MD.  [facility] 08/04/2016, 11:40 AM

## 2016-10-25 ENCOUNTER — Emergency Department (HOSPITAL_COMMUNITY)
Admission: EM | Admit: 2016-10-25 | Discharge: 2016-10-25 | Disposition: A | Discharge status: Home Health | Payer: Medicare Other | Attending: Emergency Medicine | Admitting: Emergency Medicine

## 2016-10-25 ENCOUNTER — Encounter (HOSPITAL_COMMUNITY): Payer: Self-pay | Admitting: *Deleted

## 2016-10-25 DIAGNOSIS — N182 Chronic kidney disease, stage 2 (mild): Secondary | ICD-10-CM | POA: Insufficient documentation

## 2016-10-25 DIAGNOSIS — Z79899 Other long term (current) drug therapy: Secondary | ICD-10-CM | POA: Diagnosis not present

## 2016-10-25 DIAGNOSIS — M25511 Pain in right shoulder: Secondary | ICD-10-CM | POA: Insufficient documentation

## 2016-10-25 DIAGNOSIS — I2510 Atherosclerotic heart disease of native coronary artery without angina pectoris: Secondary | ICD-10-CM | POA: Insufficient documentation

## 2016-10-25 DIAGNOSIS — I5022 Chronic systolic (congestive) heart failure: Secondary | ICD-10-CM | POA: Insufficient documentation

## 2016-10-25 DIAGNOSIS — I130 Hypertensive heart and chronic kidney disease with heart failure and stage 1 through stage 4 chronic kidney disease, or unspecified chronic kidney disease: Secondary | ICD-10-CM | POA: Diagnosis not present

## 2016-10-25 DIAGNOSIS — F17210 Nicotine dependence, cigarettes, uncomplicated: Secondary | ICD-10-CM | POA: Diagnosis not present

## 2016-10-25 DIAGNOSIS — Z955 Presence of coronary angioplasty implant and graft: Secondary | ICD-10-CM | POA: Insufficient documentation

## 2016-10-25 DIAGNOSIS — E1122 Type 2 diabetes mellitus with diabetic chronic kidney disease: Secondary | ICD-10-CM | POA: Insufficient documentation

## 2016-10-25 DIAGNOSIS — Z7982 Long term (current) use of aspirin: Secondary | ICD-10-CM | POA: Diagnosis not present

## 2016-10-25 DIAGNOSIS — Z7984 Long term (current) use of oral hypoglycemic drugs: Secondary | ICD-10-CM | POA: Insufficient documentation

## 2016-10-25 HISTORY — DX: Other intervertebral disc degeneration, lumbar region without mention of lumbar back pain or lower extremity pain: M51.369

## 2016-10-25 HISTORY — DX: Other intervertebral disc displacement, lumbar region: M51.26

## 2016-10-25 HISTORY — DX: Other intervertebral disc degeneration, lumbar region: M51.36

## 2016-10-25 MED ORDER — DICLOFENAC SODIUM 3 % TD GEL: TRANSDERMAL | 1 refills | Status: DC

## 2016-10-25 MED ORDER — TRAMADOL HCL 50 MG PO TABS
100.0000 mg | ORAL_TABLET | Freq: Once | ORAL | Status: AC
  Administered 2016-10-25: 100 mg via ORAL
  Filled 2016-10-25: qty 2

## 2016-10-25 MED ORDER — KETOROLAC TROMETHAMINE 30 MG/ML IJ SOLN
30.0000 mg | Freq: Once | INTRAMUSCULAR | Status: AC
  Administered 2016-10-25: 30 mg via INTRAMUSCULAR
  Filled 2016-10-25: qty 1

## 2016-10-25 MED ORDER — TRAMADOL HCL 50 MG PO TABS: 50.0000 mg | ORAL_TABLET | Freq: Four times a day (QID) | ORAL | 0 refills | Status: DC | PRN

## 2016-10-25 NOTE — Discharge Instructions (Signed)
° °  You will be treated with diclofenac gel as well as a short course of Ultram. Use extra strength Tylenol for mild pain. You are  scheduled to see physicians at the [facility] on Friday, October 19.

## 2016-10-25 NOTE — ED Triage Notes (Signed)
Pt c/o right shoulder pain that radiates down the right side and left lower back pain that radiates down the left leg x 3 days ago. Denies injury.

## 2016-10-25 NOTE — ED Provider Notes (Signed)
[facility] EMERGENCY DEPARTMENT Provider Note   CSN: [id number] Arrival date & time: 10/25/16  1607     History   Chief Complaint Chief Complaint  Patient presents with  . Shoulder Pain    HPI [name] is a 61 y.o. male.  Patient is a 61 year old male who presents to the emergency department with complaint of right shoulder pain.  The patient states that he has been having 3 or 4 days of increasing pain from the posterior portion of his neck to his shoulder and at times going down to the midportion of his back on the right side. He's not had any recent injury or trauma to the area. He does not recall doing any excess work or changes in his usual routine. He states that he has arthritis and other areas of his body and he thinks that this may to be an issue.  The patient has a history of coronary artery disease requiring stents. The patient states that this pain does not feel like his cardiac pain. He denies any unusual chest pain, jaw pain, neck pain, shortness of breath, or unusual sweats. There's been no loss of consciousness. Patient presents now for assistance with his pain.      Past Medical History:  Diagnosis Date  . Bulging lumbar disc   . CAD (coronary artery disease) [id number]   Cardiac cath at [facility]. # vessel CAD, with patent stent in the PLAD and RCA. Diffuse dLAD, OM2, and  RPDA disease. No aortic stenosis. Elevated LVEDP. No lesion to suggest ACS. Continue medical managment.   . CHF (congestive heart failure) ([place])   . Coronary artery disease   . Diabetes mellitus without complication ([place])   . DM2 (diabetes mellitus, type 2) ([place])   . HTN (hypertension)   . Hyperlipemia   . Hyperlipidemia   . Hypertension   . MI, old   . Obesity   . Tobacco use     Patient Active Problem List   Diagnosis Date Noted  . CKD (chronic kidney disease), stage II 08/02/2016  . Cellulitis of right hand 08/02/2016  . Chronic pain 08/02/2016  . AKI (acute  kidney injury) ([place]) 01/31/2016  . Lobar pneumonia ([place]) 01/31/2016  . Influenza A 01/31/2016  . Diarrhea 01/30/2016  . NSVT (nonsustained ventricular tachycardia) ([place]) 09/12/2015  . Precordial chest pain 09/10/2015  . Cocaine use 09/04/2015  . Near syncope 06/29/2015  . Essential hypertension 06/29/2015  . History of heart artery stents 06/29/2015  . Insulin dependent diabetes mellitus ([place]) 06/29/2015  . History of MI (myocardial infarction) 06/29/2015  . Chronic systolic CHF 02/54/2706  . Musculoskeletal chest pain 05/27/2015  . Rectal bleeding   . Stage 3 chronic renal impairment associated with type 2 diabetes mellitus ([place]) 09/09/2013  . 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 CAD- residual RCA and OM disease on re-look cath 09/07/13 09/09/2013  . Cardiomyopathy, ischemic-EF 40-45% by echo 09/07/13 09/09/2013  . S/P LAD DES June 2014 09/06/2013  . Tobacco use disorder 09/06/2013  . Hyperglycemia 08/22/2012  . NSTEMI (non-ST elevated myocardial infarction) ([place]) 07/07/2012  . Dyslipidemia 07/07/2012  . Hypertensive heart disease     Past Surgical History:  Procedure Laterality Date  . CARDIAC CATHETERIZATION N/A 09/07/2015   Procedure: Left Heart Cath and Coronary Angiography;  Surgeon: Leonie Man, MD;  Location: [place] CV LAB;  Service: Cardiovascular;  Laterality: N/A;  . CARDIAC CATHETERIZATION N/A 09/07/2015   Procedure: Coronary Stent  Intervention;  Surgeon: Leonie Man, MD;  Location: [place] CV LAB;  Service: Cardiovascular;  Laterality: N/A;  . CORONARY ANGIOGRAM  09/07/13   residual RCA and OM disease  . CORONARY ANGIOPLASTY WITH STENT PLACEMENT    . LEFT HEART CATH Bilateral 07/08/2012   Procedure: LEFT HEART CATH;  Surgeon: [name], MD;  Location: [facility] CATH LAB;  Service: Cardiovascular;  Laterality: Bilateral;  . LEFT HEART CATHETERIZATION WITH CORONARY ANGIOGRAM N/A 09/06/2013   STEMI, 2nd ISR LAD. Procedure: LEFT HEART  CATHETERIZATION WITH CORONARY ANGIOGRAM;  Surgeon: [name], MD;  Location: [facility] CATH LAB;  Service: Cardiovascular;  Laterality: N/A;  . PERCUTANEOUS CORONARY STENT INTERVENTION (PCI-S)  07/08/2012   Procedure: PERCUTANEOUS CORONARY STENT INTERVENTION (PCI-S);  Surgeon: [name], MD;  Location: [facility] CATH LAB;  Service: Cardiovascular;;  DES LAD  . PERCUTANEOUS CORONARY STENT INTERVENTION (PCI-S) N/A 09/06/2013   Procedure: PERCUTANEOUS CORONARY STENT INTERVENTION (PCI-S);  Surgeon: [name], MD;  Location: [facility] CATH LAB;  Service: Cardiovascular;  Laterality: N/A;  Mid LAD 3.0/24mm Promus       Home Medications    Prior to Admission medications   Medication Sig Start Date End Date Taking? Authorizing Provider  acetaminophen (TYLENOL) 325 MG tablet Take 2 tablets (650 mg total) by mouth every 4 (four) hours as needed for headache or mild pain. Patient not taking: Reported on 08/04/2016 09/09/13   [name], PA-C  aspirin EC 81 MG tablet Take 81 mg by mouth daily.     [provider]  atorvastatin (LIPITOR) 80 MG tablet Take 80 mg by mouth daily.    [provider]  Continuous Blood Gluc Receiver (FREESTYLE LIBRE READER) DEVI 1 packet by Does not apply route daily. 08/04/16   Rexene Alberts, MD  Continuous Blood Gluc Sensor ([place]) MISC 1 Piece by Does not apply route continuous. 08/04/16   Rexene Alberts, MD  diclofenac sodium (VOLTAREN) 1 % GEL Apply 4 g topically 4 (four) times daily.    [provider]  docusate sodium (COLACE) 100 MG capsule Take 100 mg by mouth 2 (two) times daily.    [provider]  glipiZIDE (GLUCOTROL) 5 MG tablet Take 5 mg by mouth 2 (two) times daily.    [provider]  hydrocortisone cream 0.5 % Apply 1 application topically 2 (two) times daily.    [provider]  isosorbide mononitrate (IMDUR) 30 MG 24 hr tablet Take 1 tablet (30 mg total) by mouth daily.  09/24/15 05/24/16  Lendon Colonel, NP  lisinopril (PRINIVIL,ZESTRIL) 2.5 MG tablet Take 2.5 mg by mouth daily.    [provider]  metFORMIN (GLUCOPHAGE) 1000 MG tablet Take 1 tablet (1,000 mg total) by mouth 2 (two) times daily with a meal. Do not restart Metformin until Wednesday am 7/2 Patient not taking: Reported on 08/04/2016 07/10/12   [name], MD  metoprolol succinate (TOPROL-XL) 25 MG 24 hr tablet Take 25 mg by mouth daily.    [provider]  nitroGLYCERIN (NITROSTAT) 0.4 MG SL tablet Place 1 tablet (0.4 mg total) under the tongue every 5 (five) minutes x 3 doses as needed for chest pain. Patient not taking: Reported on 08/04/2016 05/03/14   Thurnell Lose, MD  ticagrelor (BRILINTA) 90 MG TABS tablet Take 90 mg by mouth 2 (two) times daily.    [provider]  Vitamin D, Ergocalciferol, (DRISDOL) 50000 units CAPS capsule Take 50,000 Units by mouth  every 7 (seven) days.    [provider]    Family History Family History  Problem Relation Age of Onset  . Hypertension Mother   . Diabetes Mother   . Hypertension Father   . Diabetes Father   . Hypertension Sister   . Diabetes Sister     Social History Social History  Substance Use Topics  . Smoking status: Current Every Day Smoker    Packs/day: 0.50    Types: Cigarettes  . Smokeless tobacco: Never Used  . Alcohol use Yes     Comment: occ     Allergies   Patient has no known allergies.   Review of Systems Review of Systems  Constitutional: Negative for activity change.       All ROS Neg except as noted in HPI  HENT: Negative for nosebleeds.   Eyes: Negative for photophobia and discharge.  Respiratory: Negative for cough, shortness of breath and wheezing.   Cardiovascular: Negative for chest pain and palpitations.  Gastrointestinal: Negative for abdominal pain and blood in stool.  Genitourinary: Negative for dysuria, frequency and hematuria.  Musculoskeletal: Positive for  arthralgias, back pain and myalgias. Negative for neck pain.  Skin: Negative.   Neurological: Negative for dizziness, seizures and speech difficulty.  Psychiatric/Behavioral: Negative for confusion and hallucinations.     Physical Exam Updated Vital Signs BP 123/89 (BP Location: Right Arm)   Pulse 81   Temp 98.3 F (36.8 C) (Oral)   Resp 18   Ht 5\' 9"  (1.753 m)   Wt 105 kg (231 lb 6.4 oz)   SpO2 100%   BMI 34.17 kg/m   Physical Exam  Constitutional: He is oriented to person, place, and time. He appears well-developed and well-nourished.  Non-toxic appearance.  HENT:  Head: Normocephalic.  Right Ear: Tympanic membrane and external ear normal.  Left Ear: Tympanic membrane and external ear normal.  Eyes: Pupils are equal, round, and reactive to light. EOM and lids are normal.  Neck: Normal range of motion. Neck supple. Carotid bruit is not present.  No cervical lymphadenopathy noted. No carotid bruit appreciated.  Cardiovascular: Normal rate, regular rhythm, normal heart sounds, intact distal pulses and normal pulses.   Pulmonary/Chest: Breath sounds normal. No respiratory distress.  Abdominal: Soft. Bowel sounds are normal. There is no tenderness. There is no guarding.  Musculoskeletal: Normal range of motion.  There is some pain to palpation and tightness in the upper trapezius area. There is pain with attempted range of motion of the shoulder. No evidence of dislocation. No hot joints noted. There is crepitus with range of motion of the shoulder. There is good range of motion of the right elbow, no joint effusion noted. Is good range of motion of the wrist and fingers, no joint effusion appreciated. Radial pulses 2+.  Lymphadenopathy:       Head (right side): No submandibular adenopathy present.       Head (left side): No submandibular adenopathy present.    He has no cervical adenopathy.  Neurological: He is alert and oriented to person, place, and time. He has normal strength.  No cranial nerve deficit or sensory deficit.  Skin: Skin is warm and dry.  Psychiatric: He has a normal mood and affect. His speech is normal.  Nursing note and vitals reviewed.    ED Treatments / Results  Labs (all labs ordered are listed, but only abnormal results are displayed) Labs Reviewed - No data to display  EKG  EKG Interpretation None  Radiology No results found.  Procedures Procedures (including critical care time)  Medications Ordered in ED Medications  ketorolac (TORADOL) 30 MG/ML injection 30 mg (not administered)  traMADol (ULTRAM) tablet 100 mg (not administered)     Initial Impression / Assessment and Plan / ED Course  I have reviewed the triage vital signs and the nursing notes.  Pertinent labs & imaging results that were available during my care of the patient were reviewed by me and considered in my medical decision making (see chart for details).       Final Clinical Impressions(s) / ED Diagnoses MDM Vital signs within normal limits.Vital signs within normal limits. Patient has some pain and tightness involving the upper trapezius on the right. There is pain with attempted range of motion of the right shoulder with crepitus present. I suspect the patient has an acute inflammatory process flareup involving the shoulder. I suspect that he has musculoskeletal pain that involves the shoulder going down toward the mid back area. The pain can be reproduced with range of motion and with palpation. There is no history of recent chest pain, neck pain, jaw pain, unusual sweats, or loss of consciousness that would raise suspicion for this right shoulder pain being cardiac related. The pulse oximetry is 100% and the lungs are clear, doubt pulmonary related pain in the shoulder area and mid back area. rescription for diclofenac gel and 15 tablets of Ultram given to the patient. Patient is scheduled to see physicians at the [facility] on  Friday, Octobr 19.   Final diagnoses:  Acute pain of right shoulder    New Prescriptions New Prescriptions   No medications on file     Annette Stable 10/25/16 Leo Rod, MD 10/26/16 [phone]

## 2016-10-28 ENCOUNTER — Emergency Department (HOSPITAL_COMMUNITY): Payer: Medicare Other

## 2016-10-28 ENCOUNTER — Emergency Department (HOSPITAL_COMMUNITY)
Admission: EM | Admit: 2016-10-28 | Discharge: 2016-10-28 | Disposition: A | Discharge status: Home / Self Care | Payer: Medicare Other | Attending: Emergency Medicine | Admitting: Emergency Medicine

## 2016-10-28 ENCOUNTER — Encounter (HOSPITAL_COMMUNITY): Payer: Self-pay | Admitting: Emergency Medicine

## 2016-10-28 DIAGNOSIS — F17210 Nicotine dependence, cigarettes, uncomplicated: Secondary | ICD-10-CM | POA: Insufficient documentation

## 2016-10-28 DIAGNOSIS — I509 Heart failure, unspecified: Secondary | ICD-10-CM | POA: Insufficient documentation

## 2016-10-28 DIAGNOSIS — N182 Chronic kidney disease, stage 2 (mild): Secondary | ICD-10-CM | POA: Insufficient documentation

## 2016-10-28 DIAGNOSIS — Z7982 Long term (current) use of aspirin: Secondary | ICD-10-CM | POA: Insufficient documentation

## 2016-10-28 DIAGNOSIS — I252 Old myocardial infarction: Secondary | ICD-10-CM | POA: Insufficient documentation

## 2016-10-28 DIAGNOSIS — Z7984 Long term (current) use of oral hypoglycemic drugs: Secondary | ICD-10-CM | POA: Diagnosis not present

## 2016-10-28 DIAGNOSIS — E1122 Type 2 diabetes mellitus with diabetic chronic kidney disease: Secondary | ICD-10-CM | POA: Insufficient documentation

## 2016-10-28 DIAGNOSIS — M25511 Pain in right shoulder: Secondary | ICD-10-CM | POA: Diagnosis not present

## 2016-10-28 DIAGNOSIS — Z79899 Other long term (current) drug therapy: Secondary | ICD-10-CM | POA: Diagnosis not present

## 2016-10-28 DIAGNOSIS — G8929 Other chronic pain: Secondary | ICD-10-CM | POA: Insufficient documentation

## 2016-10-28 DIAGNOSIS — I130 Hypertensive heart and chronic kidney disease with heart failure and stage 1 through stage 4 chronic kidney disease, or unspecified chronic kidney disease: Secondary | ICD-10-CM | POA: Diagnosis not present

## 2016-10-28 MED ORDER — HYDROCODONE-ACETAMINOPHEN 5-325 MG PO TABS: 1.0000 | ORAL_TABLET | Freq: Four times a day (QID) | ORAL | 0 refills | Status: DC | PRN

## 2016-10-28 MED ORDER — KETOROLAC TROMETHAMINE 30 MG/ML IJ SOLN
30.0000 mg | Freq: Once | INTRAMUSCULAR | Status: AC
  Administered 2016-10-28: 30 mg via INTRAVENOUS
  Filled 2016-10-28: qty 1

## 2016-10-28 NOTE — ED Notes (Addendum)
Pt not able to move his right arm at all without pain. States he was supposed to have an appt today with his [place] PA, but she went into labor and is out. States his tramadol no longer works for pain

## 2016-10-28 NOTE — ED Provider Notes (Signed)
[facility] EMERGENCY DEPARTMENT Provider Note   CSN: [id number] Arrival date & time: 10/28/16  0840     History   Chief Complaint Chief Complaint  Patient presents with  . Shoulder Pain    HPI [name] is a 61 y.o. male past history of hypertension, diabetes who presents with chronic right shoulder pain. Patient states that this is been ongoing issue and he has not been able to follow up appropriately with the [place]. Patient was seen in the emergency department on 10/25/16 for evaluation of same symptoms. At that time he was started on tramadol and encouraged follow-up with the [place]. Patient was supposed have appointment with his [place] doctor today but states that his doctor was out of the office and he cannot be seen. Patient comes in today because the previously prescribed terminally been taking has not sufficiently helping with the pain. Additionally, patient was prescribed diclofenac but states that it was too expensive to be filled. Patient states that he has not been taking any other medications. He denies any new or changes in symptoms but states that the pain is persistent. His pain is worsened with movement of the right upper extremity. Patient denies any fevers, chills, redness/swelling of the shoulders, chest pain, difficulty breathing.  The history is provided by the patient.    Past Medical History:  Diagnosis Date  . Bulging lumbar disc   . CAD (coronary artery disease) [id number]   Cardiac cath at [facility]. # vessel CAD, with patent stent in the PLAD and RCA. Diffuse dLAD, OM2, and  RPDA disease. No aortic stenosis. Elevated LVEDP. No lesion to suggest ACS. Continue medical managment.   . CHF (congestive heart failure) ([place])   . Coronary artery disease   . Diabetes mellitus without complication ([place])   . DM2 (diabetes mellitus, type 2) ([place])   . HTN (hypertension)   . Hyperlipemia   . Hyperlipidemia   . Hypertension   . MI, old   . Obesity   . Tobacco use      Patient Active Problem List   Diagnosis Date Noted  . CKD (chronic kidney disease), stage II 08/02/2016  . Cellulitis of right hand 08/02/2016  . Chronic pain 08/02/2016  . AKI (acute kidney injury) ([place]) 01/31/2016  . Lobar pneumonia ([place]) 01/31/2016  . Influenza A 01/31/2016  . Diarrhea 01/30/2016  . NSVT (nonsustained ventricular tachycardia) ([place]) 09/12/2015  . Precordial chest pain 09/10/2015  . Cocaine use 09/04/2015  . Near syncope 06/29/2015  . Essential hypertension 06/29/2015  . History of heart artery stents 06/29/2015  . Insulin dependent diabetes mellitus ([place]) 06/29/2015  . History of MI (myocardial infarction) 06/29/2015  . Chronic systolic CHF 95/09/3265  . Musculoskeletal chest pain 05/27/2015  . Rectal bleeding   . Stage 3 chronic renal impairment associated with type 2 diabetes mellitus ([place]) 09/09/2013  . 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 CAD- residual RCA and OM disease on re-look cath 09/07/13 09/09/2013  . Cardiomyopathy, ischemic-EF 40-45% by echo 09/07/13 09/09/2013  . S/P LAD DES June 2014 09/06/2013  . Tobacco use disorder 09/06/2013  . Hyperglycemia 08/22/2012  . NSTEMI (non-ST elevated myocardial infarction) ([place]) 07/07/2012  . Dyslipidemia 07/07/2012  . Hypertensive heart disease     Past Surgical History:  Procedure Laterality Date  . CARDIAC CATHETERIZATION N/A 09/07/2015   Procedure: Left Heart Cath and Coronary Angiography;  Surgeon: Leonie Man, MD;  Location: [place] CV LAB;  Service: Cardiovascular;  Laterality: N/A;  . CARDIAC CATHETERIZATION N/A 09/07/2015   Procedure: Coronary Stent Intervention;  Surgeon: Leonie Man, MD;  Location: [place] CV LAB;  Service: Cardiovascular;  Laterality: N/A;  . CORONARY ANGIOGRAM  09/07/13   residual RCA and OM disease  . CORONARY ANGIOPLASTY WITH STENT PLACEMENT    . LEFT HEART CATH Bilateral 07/08/2012   Procedure: LEFT HEART CATH;  Surgeon: [name],  MD;  Location: [facility] CATH LAB;  Service: Cardiovascular;  Laterality: Bilateral;  . LEFT HEART CATHETERIZATION WITH CORONARY ANGIOGRAM N/A 09/06/2013   STEMI, 2nd ISR LAD. Procedure: LEFT HEART CATHETERIZATION WITH CORONARY ANGIOGRAM;  Surgeon: [name], MD;  Location: [facility] CATH LAB;  Service: Cardiovascular;  Laterality: N/A;  . PERCUTANEOUS CORONARY STENT INTERVENTION (PCI-S)  07/08/2012   Procedure: PERCUTANEOUS CORONARY STENT INTERVENTION (PCI-S);  Surgeon: [name], MD;  Location: [facility] CATH LAB;  Service: Cardiovascular;;  DES LAD  . PERCUTANEOUS CORONARY STENT INTERVENTION (PCI-S) N/A 09/06/2013   Procedure: PERCUTANEOUS CORONARY STENT INTERVENTION (PCI-S);  Surgeon: [name], MD;  Location: [facility] CATH LAB;  Service: Cardiovascular;  Laterality: N/A;  Mid LAD 3.0/24mm Promus       Home Medications    Prior to Admission medications   Medication Sig Start Date End Date Taking? Authorizing Provider  acetaminophen (TYLENOL) 325 MG tablet Take 2 tablets (650 mg total) by mouth every 4 (four) hours as needed for headache or mild pain. Patient not taking: Reported on 08/04/2016 09/09/13   [name], PA-C  aspirin EC 81 MG tablet Take 81 mg by mouth daily.     [provider]  atorvastatin (LIPITOR) 80 MG tablet Take 80 mg by mouth daily.    [provider]  Continuous Blood Gluc Receiver (FREESTYLE LIBRE READER) DEVI 1 packet by Does not apply route daily. 08/04/16   Rexene Alberts, MD  Continuous Blood Gluc Sensor ([place]) MISC 1 Piece by Does not apply route continuous. 08/04/16   Rexene Alberts, MD  diclofenac sodium (VOLTAREN) 1 % GEL Apply 4 g topically 4 (four) times daily.    [provider]  Diclofenac Sodium 3 % GEL Apply to right shoulder three times daily 10/25/16   Lily Kocher, PA-C  docusate sodium (COLACE) 100 MG capsule Take 100 mg by mouth 2 (two) times daily.    [provider]  glipiZIDE  (GLUCOTROL) 5 MG tablet Take 5 mg by mouth 2 (two) times daily.    [provider]  HYDROcodone-acetaminophen (NORCO/VICODIN) 5-325 MG tablet Take 1-2 tablets by mouth every 6 (six) hours as needed. 10/28/16   Volanda Napoleon, PA-C  hydrocortisone cream 0.5 % Apply 1 application topically 2 (two) times daily.    [provider]  isosorbide mononitrate (IMDUR) 30 MG 24 hr tablet Take 1 tablet (30 mg total) by mouth daily. 09/24/15 05/24/16  Lendon Colonel, NP  lisinopril (PRINIVIL,ZESTRIL) 2.5 MG tablet Take 2.5 mg by mouth daily.    [provider]  metFORMIN (GLUCOPHAGE) 1000 MG tablet Take 1 tablet (1,000 mg total) by mouth 2 (two) times daily with a meal. Do not restart Metformin until Wednesday am 7/2 Patient not taking: Reported on 08/04/2016 07/10/12   [name], MD  metoprolol succinate (TOPROL-XL) 25 MG 24 hr tablet Take 25 mg by mouth daily.    [provider]  nitroGLYCERIN (NITROSTAT) 0.4 MG SL tablet Place 1 tablet (0.4 mg total) under the tongue every 5 (five) minutes x  3 doses as needed for chest pain. Patient not taking: Reported on 08/04/2016 05/03/14   Thurnell Lose, MD  ticagrelor (BRILINTA) 90 MG TABS tablet Take 90 mg by mouth 2 (two) times daily.    [provider]  traMADol (ULTRAM) 50 MG tablet Take 1 tablet (50 mg total) by mouth every 6 (six) hours as needed. 10/25/16   Lily Kocher, PA-C  Vitamin D, Ergocalciferol, (DRISDOL) 50000 units CAPS capsule Take 50,000 Units by mouth every 7 (seven) days.    [provider]    Family History Family History  Problem Relation Age of Onset  . Hypertension Mother   . Diabetes Mother   . Hypertension Father   . Diabetes Father   . Hypertension Sister   . Diabetes Sister     Social History Social History  Substance Use Topics  . Smoking status: Current Every Day Smoker    Packs/day: 0.50    Types: Cigarettes  . Smokeless tobacco: Never Used  . Alcohol use  Yes     Comment: occ     Allergies   Patient has no known allergies.   Review of Systems Review of Systems  Musculoskeletal:       Right shoulder pain  Neurological: Negative for weakness and numbness.     Physical Exam Updated Vital Signs BP 121/69 (BP Location: Right Arm)   Pulse 78   Temp 98 F (36.7 C) (Oral)   Resp 18   Ht 5\' 9"  (1.753 m)   Wt 104.8 kg (231 lb)   SpO2 95%   BMI 34.11 kg/m   Physical Exam  Constitutional: He appears well-developed and well-nourished.  Sitting comfortably on examination table  HENT:  Head: Normocephalic and atraumatic.  Eyes: Conjunctivae and EOM are normal. Right eye exhibits no discharge. Left eye exhibits no discharge. No scleral icterus.  Neck: Full passive range of motion without pain.  Full flexion/extension and lateral movement of neck fully intact. No bony midline tenderness. No deformities or crepitus.   Cardiovascular: Normal rate and regular rhythm.   Pulses:      Radial pulses are 2+ on the right side, and 2+ on the left side.  Pulmonary/Chest: Effort normal and breath sounds normal.  Musculoskeletal:  Tenderness palpation to the right shoulder with overlying muscular tension that extends down to the right trapezius. Limited range of motion of right shoulder secondary to pain. No deformity or crepitus noted. No overlying warmth, erythema soft tissue swelling. Full flexion/extension of right elbow intact without difficulty. Full range of motion of right wrist without difficulty. No abnormalities of the left upper extremity.  Neurological: He is alert.  Equal grip strength bilaterally. Sensation intact along major nerve distributions of the bilateral hands   Skin: Skin is warm and dry.  Psychiatric: He has a normal mood and affect. His speech is normal and behavior is normal.  Nursing note and vitals reviewed.    ED Treatments / Results  Labs (all labs ordered are listed, but only abnormal results are  displayed) Labs Reviewed - No data to display  EKG  EKG Interpretation None       Radiology Dg Shoulder Right  Result Date: 10/28/2016 CLINICAL DATA:  Right shoulder pain, 1 week duration. EXAM: RIGHT SHOULDER - 2+ VIEW COMPARISON:  None. FINDINGS: There is osteoarthritis of the glenohumeral joint with joint space narrowing and marginal osteophytes. I think there has been previous distal clavicular resection. Humeral acromial distance within normal limits. IMPRESSION: Glenohumeral degenerative changes.  No acute findings. Probable previous distal clavicular resection. Electronically Signed   By: Nelson Chimes M.D.   On: 10/28/2016 10:36    Procedures Procedures (including critical care time)  Medications Ordered in ED Medications  ketorolac (TORADOL) 30 MG/ML injection 30 mg (30 mg Intravenous Given 10/28/16 1116)     Initial Impression / Assessment and Plan / ED Course  I have reviewed the triage vital signs and the nursing notes.  Pertinent labs & imaging results that were available during my care of the patient were reviewed by me and considered in my medical decision making (see chart for details).     61 year old male who presents with chronic right shoulder pain. Seen in the ED on 10/25/16 for same symptoms. Comes in today because Tramadol is not working for pain. Had an appointment with the [place] today but was unable to see them. No new changes or worsening symptoms. Patient is afebrile, non-toxic appearing, sitting comfortably on examination table. Vital signs reviewed and stable. Limited ROM of right shoulder secondary to pain. Equal grip strength. Consider OA vs chronic pain. Also consider rotator cuff pathology though difficulty assessing secondary to patient's limited ROM. Records reviewed show that patient received a small dose of Toradol during his previous visit. He states that significantly improved pain. Will give a small dose here. Patient has not had any XR evaluation  of the shoulder. Will evaluate for any acute changes.   Patient reviewed on the [place] substance database. There is a prescription for Tylenol 2 days ago from his previous ED visit. Prior to that he had a small prescription for oxycodone on 7/26. He also had a small prescription for hydrocodone on 7/21. Please seem to be related to acute pain incidences.   X-ray reviewed. There is glenohumeral degenerative changes but otherwise no acute abnormality. Discussed results with patient. He is very concerned about pain. Encouraged use of Tylenol for treatment of her osteoarthritis pain. Patient doubts pain will be controlled with Tylenol. Requesting additional stronger medications. I explained to him that given the chronic nature of pain and history of recent prescriptions, I'm reluctant to provide any pain medication. Patient states that he cannot take tramadol as it does not have any effect on him. He is going to arrange for follow-up appointment. Since his appointment was canceled. We'll give her short course of pain medication. Encouraged use of Tylenol, heat therapy and appropriate follow-up. We'll provide clinic for further evaluation that he can admit him to see in case he cannot be seen by the [place].    Final Clinical Impressions(s) / ED Diagnoses   Final diagnoses:  Chronic right shoulder pain    New Prescriptions Discharge Medication List as of 10/28/2016 11:17 AM    START taking these medications   Details  HYDROcodone-acetaminophen (NORCO/VICODIN) 5-325 MG tablet Take 1-2 tablets by mouth every 6 (six) hours as needed., Starting Fri 10/28/2016, Print         Volanda Napoleon, PA-C 10/30/16 [id number]    Orlie Dakin, MD 10/31/16 1341

## 2016-10-28 NOTE — Discharge Instructions (Signed)
You can take Tylenol as directed for pain. You can take the pain medication for severe or breakthrough pain. Do not take it at the same time as the tylenol.   As we discussed, you can use Lidocaine patches that can be found at [facility] to help with the pain.   Follow-up with her primary care doctor next 4 days. If he cannot arrange for an appointment near her primary care doctor, use referred clinic for further primary care evaluation.  Return to emergency room for any worsening pain, numbness/weakness in the arms, fevers, redness or swelling of any other worsening or concerning symptoms.

## 2016-10-28 NOTE — ED Triage Notes (Signed)
Patient complaining of right shoulder pain x 1 week. States he was treated here recently for same and given medications that are not working. States [facility] but had to postpone due to dr not in office.

## 2016-11-03 ENCOUNTER — Emergency Department (HOSPITAL_COMMUNITY): Payer: Medicare Other

## 2016-11-03 ENCOUNTER — Observation Stay (HOSPITAL_COMMUNITY)
Admission: EM | Admit: 2016-11-03 | Discharge: 2016-11-04 | Disposition: A | Discharge status: Home / Self Care | Payer: Medicare Other | Attending: Family Medicine | Admitting: Family Medicine

## 2016-11-03 ENCOUNTER — Encounter (HOSPITAL_COMMUNITY): Payer: Self-pay

## 2016-11-03 DIAGNOSIS — F17210 Nicotine dependence, cigarettes, uncomplicated: Secondary | ICD-10-CM | POA: Diagnosis not present

## 2016-11-03 DIAGNOSIS — Z79899 Other long term (current) drug therapy: Secondary | ICD-10-CM | POA: Insufficient documentation

## 2016-11-03 DIAGNOSIS — Z7984 Long term (current) use of oral hypoglycemic drugs: Secondary | ICD-10-CM | POA: Insufficient documentation

## 2016-11-03 DIAGNOSIS — I130 Hypertensive heart and chronic kidney disease with heart failure and stage 1 through stage 4 chronic kidney disease, or unspecified chronic kidney disease: Secondary | ICD-10-CM | POA: Insufficient documentation

## 2016-11-03 DIAGNOSIS — I200 Unstable angina: Principal | ICD-10-CM | POA: Insufficient documentation

## 2016-11-03 DIAGNOSIS — R079 Chest pain, unspecified: Secondary | ICD-10-CM | POA: Diagnosis not present

## 2016-11-03 DIAGNOSIS — I509 Heart failure, unspecified: Secondary | ICD-10-CM | POA: Insufficient documentation

## 2016-11-03 DIAGNOSIS — I5022 Chronic systolic (congestive) heart failure: Secondary | ICD-10-CM | POA: Diagnosis present

## 2016-11-03 DIAGNOSIS — N182 Chronic kidney disease, stage 2 (mild): Secondary | ICD-10-CM | POA: Diagnosis not present

## 2016-11-03 DIAGNOSIS — E1149 Type 2 diabetes mellitus with other diabetic neurological complication: Secondary | ICD-10-CM | POA: Diagnosis present

## 2016-11-03 DIAGNOSIS — F17200 Nicotine dependence, unspecified, uncomplicated: Secondary | ICD-10-CM | POA: Diagnosis present

## 2016-11-03 DIAGNOSIS — Z7982 Long term (current) use of aspirin: Secondary | ICD-10-CM | POA: Diagnosis not present

## 2016-11-03 DIAGNOSIS — E119 Type 2 diabetes mellitus without complications: Secondary | ICD-10-CM | POA: Diagnosis not present

## 2016-11-03 DIAGNOSIS — E785 Hyperlipidemia, unspecified: Secondary | ICD-10-CM | POA: Diagnosis present

## 2016-11-03 DIAGNOSIS — R0602 Shortness of breath: Secondary | ICD-10-CM | POA: Diagnosis not present

## 2016-11-03 DIAGNOSIS — R05 Cough: Secondary | ICD-10-CM | POA: Diagnosis not present

## 2016-11-03 DIAGNOSIS — K5900 Constipation, unspecified: Secondary | ICD-10-CM | POA: Diagnosis present

## 2016-11-03 DIAGNOSIS — I5023 Acute on chronic systolic (congestive) heart failure: Secondary | ICD-10-CM | POA: Diagnosis present

## 2016-11-03 DIAGNOSIS — I10 Essential (primary) hypertension: Secondary | ICD-10-CM | POA: Diagnosis present

## 2016-11-03 DIAGNOSIS — I2510 Atherosclerotic heart disease of native coronary artery without angina pectoris: Secondary | ICD-10-CM | POA: Diagnosis present

## 2016-11-03 HISTORY — DX: Chronic kidney disease, stage 2 (mild): N18.2

## 2016-11-03 HISTORY — DX: Cocaine abuse, uncomplicated: F14.10

## 2016-11-03 HISTORY — DX: Ischemic cardiomyopathy: I25.5

## 2016-11-03 LAB — COMPREHENSIVE METABOLIC PANEL
ALT: 13 U/L — ABNORMAL LOW (ref 17–63)
AST: 13 U/L — AB (ref 15–41)
Albumin: 3.4 g/dL — ABNORMAL LOW (ref 3.5–5.0)
Alkaline Phosphatase: 80 U/L (ref 38–126)
Anion gap: 8 (ref 5–15)
BUN: 13 mg/dL (ref 6–20)
CHLORIDE: 101 mmol/L (ref 101–111)
CO2: 27 mmol/L (ref 22–32)
Calcium: 8.7 mg/dL — ABNORMAL LOW (ref 8.9–10.3)
Creatinine, Ser: 1.11 mg/dL (ref 0.61–1.24)
Glucose, Bld: 103 mg/dL — ABNORMAL HIGH (ref 65–99)
POTASSIUM: 3.7 mmol/L (ref 3.5–5.1)
Sodium: 136 mmol/L (ref 135–145)
Total Bilirubin: 0.4 mg/dL (ref 0.3–1.2)
Total Protein: 7.3 g/dL (ref 6.5–8.1)

## 2016-11-03 LAB — CBC WITH DIFFERENTIAL/PLATELET
BASOS ABS: 0 10*3/uL (ref 0.0–0.1)
Basophils Relative: 0 %
EOS ABS: 0.7 10*3/uL (ref 0.0–0.7)
EOS PCT: 7 %
HCT: 40.7 % (ref 39.0–52.0)
Hemoglobin: 13.2 g/dL (ref 13.0–17.0)
LYMPHS PCT: 20 %
Lymphs Abs: 2 10*3/uL (ref 0.7–4.0)
MCH: 25.7 pg — ABNORMAL LOW (ref 26.0–34.0)
MCHC: 32.4 g/dL (ref 30.0–36.0)
MCV: 79.3 fL (ref 78.0–100.0)
MONO ABS: 0.4 10*3/uL (ref 0.1–1.0)
Monocytes Relative: 4 %
Neutro Abs: 6.8 10*3/uL (ref 1.7–7.7)
Neutrophils Relative %: 69 %
PLATELETS: 265 10*3/uL (ref 150–400)
RBC: 5.13 MIL/uL (ref 4.22–5.81)
RDW: 16.8 % — AB (ref 11.5–15.5)
WBC: 9.9 10*3/uL (ref 4.0–10.5)

## 2016-11-03 LAB — I-STAT CHEM 8, ED
BUN: 12 mg/dL (ref 6–20)
CALCIUM ION: 1.14 mmol/L — AB (ref 1.15–1.40)
Chloride: 100 mmol/L — ABNORMAL LOW (ref 101–111)
Creatinine, Ser: 1.1 mg/dL (ref 0.61–1.24)
GLUCOSE: 101 mg/dL — AB (ref 65–99)
HCT: 43 % (ref 39.0–52.0)
HEMOGLOBIN: 14.6 g/dL (ref 13.0–17.0)
POTASSIUM: 3.7 mmol/L (ref 3.5–5.1)
Sodium: 139 mmol/L (ref 135–145)
TCO2: 26 mmol/L (ref 22–32)

## 2016-11-03 LAB — TROPONIN I: Troponin I: <0.03 ng/mL (ref <0.03)

## 2016-11-03 LAB — I-STAT TROPONIN, ED: TROPONIN I, POC: 0 ng/mL (ref 0.00–0.08)

## 2016-11-03 LAB — MAGNESIUM: MAGNESIUM: 1.9 mg/dL (ref 1.7–2.4)

## 2016-11-03 LAB — GLUCOSE, CAPILLARY: GLUCOSE-CAPILLARY: 98 mg/dL (ref 65–99)

## 2016-11-03 MED ORDER — METFORMIN HCL 500 MG PO TABS
1000.0000 mg | ORAL_TABLET | Freq: Two times a day (BID) | ORAL | Status: DC
  Administered 2016-11-04: 1000 mg via ORAL
  Filled 2016-11-03: qty 2

## 2016-11-03 MED ORDER — IPRATROPIUM-ALBUTEROL 0.5-2.5 (3) MG/3ML IN SOLN: 3.0000 mL | Freq: Four times a day (QID) | RESPIRATORY_TRACT | Status: DC | PRN

## 2016-11-03 MED ORDER — MAGNESIUM CITRATE PO SOLN
0.5000 | Freq: Once | ORAL | Status: AC
  Administered 2016-11-03: 0.5 via ORAL
  Filled 2016-11-03: qty 296

## 2016-11-03 MED ORDER — DOCUSATE SODIUM 100 MG PO CAPS: 100.0000 mg | ORAL_CAPSULE | Freq: Every day | ORAL | Status: DC | PRN

## 2016-11-03 MED ORDER — ACETAMINOPHEN 325 MG PO TABS: 650.0000 mg | ORAL_TABLET | Freq: Four times a day (QID) | ORAL | Status: DC | PRN

## 2016-11-03 MED ORDER — NITROGLYCERIN 0.4 MG SL SUBL: 0.4000 mg | SUBLINGUAL_TABLET | SUBLINGUAL | Status: DC | PRN

## 2016-11-03 MED ORDER — CLOPIDOGREL BISULFATE 75 MG PO TABS
300.0000 mg | ORAL_TABLET | Freq: Once | ORAL | Status: AC
  Administered 2016-11-03: 300 mg via ORAL
  Filled 2016-11-03: qty 4

## 2016-11-03 MED ORDER — INSULIN ASPART 100 UNIT/ML ~~LOC~~ SOLN
0.0000 [IU] | Freq: Three times a day (TID) | SUBCUTANEOUS | Status: DC
  Administered 2016-11-04: 2 [IU] via SUBCUTANEOUS

## 2016-11-03 MED ORDER — CLOPIDOGREL BISULFATE 75 MG PO TABS
75.0000 mg | ORAL_TABLET | Freq: Every day | ORAL | Status: DC
  Administered 2016-11-04: 75 mg via ORAL
  Filled 2016-11-03: qty 1

## 2016-11-03 MED ORDER — ALPRAZOLAM 0.25 MG PO TABS
0.2500 mg | ORAL_TABLET | Freq: Two times a day (BID) | ORAL | Status: DC | PRN
  Administered 2016-11-04: 0.25 mg via ORAL
  Filled 2016-11-03: qty 1

## 2016-11-03 MED ORDER — ASPIRIN EC 81 MG PO TBEC
81.0000 mg | DELAYED_RELEASE_TABLET | Freq: Every day | ORAL | Status: DC
  Administered 2016-11-03 – 2016-11-04 (×2): 81 mg via ORAL
  Filled 2016-11-03 (×2): qty 1

## 2016-11-03 MED ORDER — ENOXAPARIN SODIUM 40 MG/0.4ML ~~LOC~~ SOLN
40.0000 mg | SUBCUTANEOUS | Status: DC
  Administered 2016-11-03: 40 mg via SUBCUTANEOUS
  Filled 2016-11-03: qty 0.4

## 2016-11-03 MED ORDER — NICOTINE 14 MG/24HR TD PT24
14.0000 mg | MEDICATED_PATCH | Freq: Every day | TRANSDERMAL | Status: DC | PRN
  Administered 2016-11-03 – 2016-11-04 (×2): 14 mg via TRANSDERMAL
  Filled 2016-11-03 (×2): qty 1

## 2016-11-03 MED ORDER — ONDANSETRON HCL 4 MG/2ML IJ SOLN: 4.0000 mg | Freq: Four times a day (QID) | INTRAMUSCULAR | Status: DC | PRN

## 2016-11-03 MED ORDER — NITROGLYCERIN 0.4 MG SL SUBL
0.4000 mg | SUBLINGUAL_TABLET | SUBLINGUAL | Status: AC | PRN
  Administered 2016-11-03 (×3): 0.4 mg via SUBLINGUAL
  Filled 2016-11-03: qty 1

## 2016-11-03 MED ORDER — GI COCKTAIL ~~LOC~~
30.0000 mL | Freq: Once | ORAL | Status: AC
  Administered 2016-11-03: 30 mL via ORAL
  Filled 2016-11-03: qty 30

## 2016-11-03 MED ORDER — MORPHINE SULFATE (PF) 4 MG/ML IV SOLN
4.0000 mg | INTRAVENOUS | Status: DC | PRN
  Administered 2016-11-04: 4 mg via INTRAVENOUS
  Filled 2016-11-03: qty 1

## 2016-11-03 MED ORDER — ASPIRIN 325 MG PO TABS
325.0000 mg | ORAL_TABLET | Freq: Once | ORAL | Status: AC
  Administered 2016-11-03: 325 mg via ORAL
  Filled 2016-11-03: qty 1

## 2016-11-03 MED ORDER — TICAGRELOR 90 MG PO TABS
90.0000 mg | ORAL_TABLET | Freq: Once | ORAL | Status: DC
  Filled 2016-11-03: qty 1

## 2016-11-03 MED ORDER — TRAMADOL HCL 50 MG PO TABS
50.0000 mg | ORAL_TABLET | Freq: Four times a day (QID) | ORAL | Status: DC | PRN
  Administered 2016-11-04: 50 mg via ORAL
  Filled 2016-11-03 (×2): qty 1

## 2016-11-03 MED ORDER — MORPHINE SULFATE (PF) 4 MG/ML IV SOLN
4.0000 mg | Freq: Once | INTRAVENOUS | Status: AC
  Administered 2016-11-03: 4 mg via INTRAVENOUS
  Filled 2016-11-03: qty 1

## 2016-11-03 MED ORDER — METOPROLOL TARTRATE 25 MG PO TABS
25.0000 mg | ORAL_TABLET | Freq: Two times a day (BID) | ORAL | Status: DC
  Administered 2016-11-03 – 2016-11-04 (×2): 25 mg via ORAL
  Filled 2016-11-03 (×2): qty 1

## 2016-11-03 NOTE — ED Notes (Signed)
Pt states he feels like he has to "belch" but can't

## 2016-11-03 NOTE — ED Notes (Signed)
Pt is refusing anymore Nitroglycerin

## 2016-11-03 NOTE — H&P (Signed)
History and Physical    [name] UTM:[id number] DOB: December 13, 1955 DOA: 11/03/2016  PCP: [name], MD   Patient coming from: Home.  I have personally briefly reviewed patient's old medical records in [place]  Chief Complaint: Chest pain.  HPI: [name] is a 61 y.o. male with medical history significant of chronic back pain, history of bulging lumbar disks, CAD/history of MI/stent placement 3, chronic systolic CHF, type 2 diabetes, chronic renal disease secondary to diabetes, hypertension, hyperlipidemia, obesity, tobacco use who is coming to the emergency department with complaints of having two chest pain episodes today.  Per patient, this morning while he was watching TV he developed precordial pressure-like chest pain (felt like gas was in his chest), radiates to his epigastric area, neck and left upper extremity all the way from the arm down to the hand. Associated symptoms were mild dyspnea, but he denies nausea, emesis, diaphoresis and dizziness. He mentions that these symptoms were similar to when he had an MI. He denies PND, orthopnea or recent pitting edema lower extremities. He is an active smoker of half a pack of cigarettes a day, complaints of occasional productive cough and occasional wheezing. He denies EtOH use or recreational drug use.  ED Course: Initial vital signs temperature 97.82F, pulse 78, respirations 20, blood pressure 136/89 mmHg and O2 sat 93% on room air. EKG showed sinus versus ectopic rhythm, anterior/inferior old infarct. No significant changes when compared to previous tracing. I-STAT troponin and serum troponin were negative. CBC was normal. CMP showed a nonfasting glucose of 103, calcium 8.7 mg/dL. Albumin was 3.4 g/dL. AST and ALT were both 13 units. The rest of the chemistry values were unremarkable.  Imaging: His chest radiograph showed enlargement of cardiac silhouette with pulmonary vascular congestion. RIGHT upper lobe scarring  with subsegmental atelectasis at RIGHT base and minimal chronic accentuation of LEFT basilar markings.  Review of Systems: As per HPI otherwise 10 point review of systems negative.    Past Medical History:  Diagnosis Date  . Bulging lumbar disc   . CAD (coronary artery disease) [id number]   Cardiac cath at [facility]. # vessel CAD, with patent stent in the PLAD and RCA. Diffuse dLAD, OM2, and  RPDA disease. No aortic stenosis. Elevated LVEDP. No lesion to suggest ACS. Continue medical managment.   . CHF (congestive heart failure) ([place])   . Coronary artery disease   . Diabetes mellitus without complication ([place])   . DM2 (diabetes mellitus, type 2) ([place])   . HTN (hypertension)   . Hyperlipemia   . Hyperlipidemia   . Hypertension   . MI, old   . Obesity   . Tobacco use     Past Surgical History:  Procedure Laterality Date  . CARDIAC CATHETERIZATION N/A 09/07/2015   Procedure: Left Heart Cath and Coronary Angiography;  Surgeon: Leonie Man, MD;  Location: [place] CV LAB;  Service: Cardiovascular;  Laterality: N/A;  . CARDIAC CATHETERIZATION N/A 09/07/2015   Procedure: Coronary Stent Intervention;  Surgeon: Leonie Man, MD;  Location: [place] [facility];  Service: Cardiovascular;  Laterality: N/A;  . CORONARY ANGIOGRAM  09/07/13   residual RCA and OM disease  . CORONARY ANGIOPLASTY WITH STENT PLACEMENT    . LEFT HEART CATH Bilateral 07/08/2012   Procedure: LEFT HEART CATH;  Surgeon: [name], MD;  Location: [facility] CATH LAB;  Service: Cardiovascular;  Laterality: Bilateral;  . LEFT HEART CATHETERIZATION WITH CORONARY ANGIOGRAM N/A 09/06/2013  STEMI, 2nd ISR LAD. Procedure: LEFT HEART CATHETERIZATION WITH CORONARY ANGIOGRAM;  Surgeon: [name], MD;  Location: [facility] CATH LAB;  Service: Cardiovascular;  Laterality: N/A;  . PERCUTANEOUS CORONARY STENT INTERVENTION (PCI-S)  07/08/2012   Procedure: PERCUTANEOUS CORONARY STENT INTERVENTION (PCI-S);  Surgeon:  [name], MD;  Location: [facility] CATH LAB;  Service: Cardiovascular;;  DES LAD  . PERCUTANEOUS CORONARY STENT INTERVENTION (PCI-S) N/A 09/06/2013   Procedure: PERCUTANEOUS CORONARY STENT INTERVENTION (PCI-S);  Surgeon: [name], MD;  Location: [facility] CATH LAB;  Service: Cardiovascular;  Laterality: N/A;  Mid LAD 3.0/24mm Promus     reports that he has been smoking Cigarettes.  He has been smoking about 0.50 packs per day. He has never used smokeless tobacco. He reports that he drinks alcohol. He reports that he does not use drugs.  No Known Allergies  Family History  Problem Relation Age of Onset  . Hypertension Mother   . Diabetes Mother   . Hypertension Father   . Diabetes Father   . Hypertension Sister   . Diabetes Sister     Prior to Admission medications   Medication Sig Start Date End Date Taking? Authorizing Provider  aspirin EC 81 MG tablet Take 81 mg by mouth daily.    Yes [provider]  docusate sodium (COLACE) 100 MG capsule Take 100 mg by mouth daily as needed for mild constipation.    Yes [provider]  HYDROcodone-acetaminophen (NORCO/VICODIN) 5-325 MG tablet Take 1-2 tablets by mouth every 6 (six) hours as needed. 10/28/16  Yes Providence Lanius A, PA-C  magnesium citrate SOLN Take 1 Bottle by mouth once.   Yes [provider]  metFORMIN (GLUCOPHAGE) 1000 MG tablet Take 1 tablet (1,000 mg total) by mouth 2 (two) times daily with a meal. Do not restart Metformin until Wednesday am 7/2 07/10/12  Yes Turner, Eber Hong, MD  nitroGLYCERIN (NITROSTAT) 0.4 MG SL tablet Place 1 tablet (0.4 mg total) under the tongue every 5 (five) minutes x 3 doses as needed for chest pain. 05/03/14  Yes Thurnell Lose, MD  traMADol (ULTRAM) 50 MG tablet Take 1 tablet (50 mg total) by mouth every 6 (six) hours as needed. 10/25/16  Yes Lily Kocher, PA-C  Continuous Blood Gluc Receiver (FREESTYLE LIBRE READER) [employer] 1 packet by Does not apply route daily.  08/04/16   Rexene Alberts, MD  Continuous Blood Gluc Sensor ([place]) MISC 1 Piece by Does not apply route continuous. 08/04/16   Rexene Alberts, MD    Physical Exam: Vitals:   11/03/16 1730 11/03/16 1800 11/03/16 1831 11/03/16 1930  BP: 113/74 123/69 123/69 109/64  Pulse: 73 76 71 69  Resp: (!) 23 (!) 29 18 (!) 25  Temp:      TempSrc:      SpO2: 96% 93% 92% 94%  Weight:      Height:        Constitutional: NAD, calm, comfortable Eyes: PERRL, lids and conjunctivae normal ENMT: Mucous membranes are moist. Posterior pharynx clear of any exudate or lesions. Absent or in poor state of repair dentition.  Neck: normal, supple, no masses, no thyromegaly Respiratory: Clear to auscultation bilaterally, no wheezing, no crackles. Normal respiratory effort. No accessory muscle use.  Cardiovascular: Regular rate and rhythm, no murmurs / rubs / gallops. No extremity edema. 2+ pedal pulses. No carotid bruits.  Abdomen: Obese, soft, no tenderness, no masses palpated. No hepatosplenomegaly. Bowel sounds positive.  Musculoskeletal: no clubbing / cyanosis. Good  ROM, no contractures. Normal muscle tone.  Skin: no rashes, lesions, ulcers on limited dermatological exam. Neurologic: CN 2-12 grossly intact. Sensation intact, DTR normal. Strength 5/5 in all 4.  Psychiatric: Normal judgment and insight. Alert and oriented x 4. Normal mood.    Labs on Admission: I have personally reviewed following labs and imaging studies  CBC:  Recent Labs Lab 11/03/16 1723 11/03/16 1727  WBC 9.9  --   NEUTROABS 6.8  --   HGB 13.2 14.6  HCT 40.7 43.0  MCV 79.3  --   PLT 265  --    Basic Metabolic Panel:  Recent Labs Lab 11/03/16 1723 11/03/16 1727  NA 136 139  K 3.7 3.7  CL 101 100*  CO2 27  --   GLUCOSE 103* 101*  BUN 13 12  CREATININE 1.11 1.10  CALCIUM 8.7*  --    GFR: Estimated Creatinine Clearance: 83 mL/min (by C-G formula based on SCr of 1.1 mg/dL). Liver Function  Tests:  Recent Labs Lab 11/03/16 1723  AST 13*  ALT 13*  ALKPHOS 80  BILITOT 0.4  PROT 7.3  ALBUMIN 3.4*   No results for input(s): LIPASE, AMYLASE in the last 168 hours. No results for input(s): AMMONIA in the last 168 hours. Coagulation Profile: No results for input(s): INR, PROTIME in the last 168 hours. Cardiac Enzymes:  Recent Labs Lab 11/03/16 1723  TROPONINI <0.03   BNP (last 3 results) No results for input(s): PROBNP in the last 8760 hours. HbA1C: No results for input(s): HGBA1C in the last 72 hours. CBG: No results for input(s): GLUCAP in the last 168 hours. Lipid Profile: No results for input(s): CHOL, HDL, LDLCALC, TRIG, CHOLHDL, LDLDIRECT in the last 72 hours. Thyroid Function Tests: No results for input(s): TSH, T4TOTAL, FREET4, T3FREE, THYROIDAB in the last 72 hours. Anemia Panel: No results for input(s): VITAMINB12, FOLATE, FERRITIN, TIBC, IRON, RETICCTPCT in the last 72 hours. Urine analysis:    Component Value Date/Time   COLORURINE YELLOW 09/05/2015 [place] 09/05/2015 0958   LABSPEC 1.034 (H) 09/05/2015 0958   PHURINE 5.0 09/05/2015 0958   GLUCOSEU >1000 (A) 09/05/2015 0958   HGBUR NEGATIVE 09/05/2015 0958   BILIRUBINUR NEGATIVE 09/05/2015 0958   KETONESUR NEGATIVE 09/05/2015 0958   PROTEINUR NEGATIVE 09/05/2015 0958   UROBILINOGEN 0.2 09/27/2014 2237   NITRITE NEGATIVE 09/05/2015 0958   LEUKOCYTESUR NEGATIVE 09/05/2015 0958    Radiological Exams on Admission: Dg Chest Portable 1 View  Result Date: 11/03/2016 CLINICAL DATA:  LEFT chest pain radiating to LEFT shoulder and down LEFT arm, shortness of breath beginning this morning, productive cough for 2-3 days, history CHF, diabetes mellitus, hypertension, coronary disease post MI and PTCA EXAM: PORTABLE CHEST 1 VIEW COMPARISON:  Portable exam 1726 hours compared to 01/30/2016 FINDINGS: Enlargement of cardiac silhouette with pulmonary vascular congestion. Mediastinal contours  normal. Slightly accentuated perihilar markings on RIGHT appear chronic questions minimal scarring in the RIGHT upper lobe. Again seen hazy increased markings in the lingula as well. Minimal RIGHT basilar atelectasis. No definite acute infiltrate, pleural effusion, or pneumothorax. IMPRESSION: Enlargement of cardiac silhouette with pulmonary vascular congestion. RIGHT upper lobe scarring with subsegmental atelectasis at RIGHT base and minimal chronic accentuation of LEFT basilar markings. Electronically Signed   By: Lavonia Dana M.D.   On: 11/03/2016 17:47   09/06/2105 cardiac catherization  Left Heart Cath and Coronary Angiography  Conclusion     Mid RCA lesion, 99 %stenosed. Culprit lesion  A STENT SYNERGY DES  3X28 drug eluting stent was successfully placed. Post intervention, there is a 0% residual stenosis.  Distal RCA at the bifurcation, 80% stenosis.Ost RPDA lesion, 70 %stenosed. - Similar to prior procedure  Ostial LAD ~30% followed by STENT in Prox LAD to Mid LAD Stent - 30 %stenosed. Dist LAD lesion, 65 %stenosed.  Ost 2nd Diag lesion, 80 %stenosed. Jailed by stent  1st Mrg-1 lesion, 60 %stenosed. 1st Mrg-2 lesion, 90 %stenosed. - Similar to prior procedure  There is severe left ventricular systolic dysfunction. The left ventricular ejection fraction is 25-35% by visual estimate. With near akinesis of the apex and severe hypokinesis of the mid anterior and mid inferior walls.  LV end diastolic pressure is severely elevated.  There is mild (2+) mitral regurgitation.   The patient's coronary artery disease has progressed with significant progression in the mid RCA now to 99% stenosis. The distal RCA and PDA lesions are stable. The OM1 lesions are stable.  The LAD stent is patent but with mild in-stent restenosis. There is distal LAD disease.  Successful DES PCI to the mid RCA.  Plan:  Transfer to [facility] post procedure unit for sheath removal with manual pressure.  Restart  Effient (could change to Plavix if necessary) - for minimum one year. Continue aspirin  Continue statin, consider increasing dose for discharge.  Relook at echocardiogram, because EF by LV gram was notably lower than echo.  Continue aggressive risk factor modification: Smoking cessation counseling, cocaine abuse counseling, diabetes management, hypertension management and lipid management.     09/05/2015 echocardiogram ------------------------------------------------------------------- LV EF: 45% -   50%  ------------------------------------------------------------------- Indications:      Chest pain 786.51.  ------------------------------------------------------------------- History:   PMH:  H/O coronary artery stenting. H/O noncompliance. Cocaine use. Hypertension. Hyperlipidemia. Diabetes.  ------------------------------------------------------------------- Study Conclusions  - Left ventricle: The cavity size was normal. Wall thickness was   increased increased in a pattern of mild to moderate LVH.   Systolic function was mildly reduced. The estimated ejection   fraction was in the range of 45% to 50%. Hypokinesis of the   mid-apicalinferior and apical myocardium. Hypokinesis of the   apicalanteroseptal myocardium. - Mitral valve: There was mild regurgitation. - Left atrium: The atrium was moderately dilated.   EKG: Independently reviewed. Vent. rate 77 BPM PR interval * ms QRS duration 82 ms QT/QTc 402/455 ms P-R-T axes -53 -81 110 Sinus or ectopic atrial rhythm Inferior infarct, old Extensive anterior infarct, old  Assessment/Plan Principal Problem:   Chest pain Observation/telemetry. Supplemental oxygen as needed. Sublingual nitroglycerin as needed. Morphine sulfate 4 mg IVP every 4 hours when necessary for severe pain. Resume beta blocker and second agent antiplatelet therapy. Trend troponin levels.  Active Problems:   CAD (coronary artery  disease) Declined to resume Brilinta in the ED. Continue aspirin. Restart metoprolol 25 mg by mouth twice a day. Restart Plavix 75 mg by mouth daily, after initial loading dose. Strongly advised to cease smoking.     Dyslipidemia Not on medical therapy currently. Declined to resume statin at this time.    Tobacco use disorder Nicotine replacement therapy ordered. Tobacco cessation information to be provided.    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 Continue metformin 100 mg PO BID. CBG monitoring with regular insulin sliding scale. Check hemoglobin A1c in AM.    Essential hypertension Started on metoprolol 25 mg by mouth twice a day. Monitor blood pressure and heart rate.    Chronic systolic CHF No signs of decompensation  at this time. Not taking beta blockers or Ace inhibitors.    DVT prophylaxis: Lovenox SQ. Code Status: Full code. Family Communication:  Disposition Plan: Observation for chest pain.  Consults called: Routine a.m. cardiology consult. Admission status: Observation/telemetry.   Reubin Milan MD [facility] Pager [phone].  If 7PM-7AM, please contact night-coverage [email or web address] Password [facility]  11/03/2016, 7:49 PM

## 2016-11-03 NOTE — ED Provider Notes (Signed)
[facility] EMERGENCY DEPARTMENT Provider Note   CSN: [id number] Arrival date & time: 11/03/16  1653     History   Chief Complaint Chief Complaint  Patient presents with  . Chest Pain    HPI [name] is a 61 y.o. male.  Patient complains of chest pain today.  He took some nitroglycerin and it helped and then the pain returned.  He has a history of an MI and stents and has stopped taking all his medicines but his metformin   The history is provided by the patient. No language interpreter was used.  Chest Pain   This is a new problem. The problem occurs constantly. The problem has not changed since onset.The pain is associated with exertion. The pain is present in the substernal region. The pain is at a severity of 8/10. The pain is moderate. The pain radiates to the epigastrium. The symptoms are aggravated by exertion. Pertinent negatives include no abdominal pain, no back pain, no cough, no headaches and no malaise/fatigue.  Pertinent negatives for past medical history include no seizures.    Past Medical History:  Diagnosis Date  . Bulging lumbar disc   . CAD (coronary artery disease) 6\5\0354   Cardiac cath at [facility]. # vessel CAD, with patent stent in the PLAD and RCA. Diffuse dLAD, OM2, and  RPDA disease. No aortic stenosis. Elevated LVEDP. No lesion to suggest ACS. Continue medical managment.   . CHF (congestive heart failure) ([place])   . Coronary artery disease   . Diabetes mellitus without complication ([place])   . DM2 (diabetes mellitus, type 2) ([place])   . HTN (hypertension)   . Hyperlipemia   . Hyperlipidemia   . Hypertension   . MI, old   . Obesity   . Tobacco use     Patient Active Problem List   Diagnosis Date Noted  . Chest pain 11/03/2016  . CKD (chronic kidney disease), stage II 08/02/2016  . Cellulitis of right hand 08/02/2016  . Chronic pain 08/02/2016  . AKI (acute kidney injury) ([place]) 01/31/2016  . Lobar pneumonia ([place]) 01/31/2016    . Influenza A 01/31/2016  . Diarrhea 01/30/2016  . NSVT (nonsustained ventricular tachycardia) ([place]) 09/12/2015  . Precordial chest pain 09/10/2015  . Cocaine use 09/04/2015  . Near syncope 06/29/2015  . Essential hypertension 06/29/2015  . History of heart artery stents 06/29/2015  . Insulin dependent diabetes mellitus ([place]) 06/29/2015  . History of MI (myocardial infarction) 06/29/2015  . Chronic systolic CHF 65/68/1275  . Musculoskeletal chest pain 05/27/2015  . Rectal bleeding   . Stage 3 chronic renal impairment associated with type 2 diabetes mellitus ([place]) 09/09/2013  . 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 CAD- residual RCA and OM disease on re-look cath 09/07/13 09/09/2013  . Cardiomyopathy, ischemic-EF 40-45% by echo 09/07/13 09/09/2013  . S/P LAD DES June 2014 09/06/2013  . Tobacco use disorder 09/06/2013  . Hyperglycemia 08/22/2012  . NSTEMI (non-ST elevated myocardial infarction) ([place]) 07/07/2012  . Dyslipidemia 07/07/2012  . Hypertensive heart disease     Past Surgical History:  Procedure Laterality Date  . CARDIAC CATHETERIZATION N/A 09/07/2015   Procedure: Left Heart Cath and Coronary Angiography;  Surgeon: Leonie Man, MD;  Location: [place] CV LAB;  Service: Cardiovascular;  Laterality: N/A;  . CARDIAC CATHETERIZATION N/A 09/07/2015   Procedure: Coronary Stent Intervention;  Surgeon: Leonie Man, MD;  Location: [place] CV LAB;  Service: Cardiovascular;  Laterality: N/A;  .  CORONARY ANGIOGRAM  09/07/13   residual RCA and OM disease  . CORONARY ANGIOPLASTY WITH STENT PLACEMENT    . LEFT HEART CATH Bilateral 07/08/2012   Procedure: LEFT HEART CATH;  Surgeon: [name], MD;  Location: [facility] CATH LAB;  Service: Cardiovascular;  Laterality: Bilateral;  . LEFT HEART CATHETERIZATION WITH CORONARY ANGIOGRAM N/A 09/06/2013   STEMI, 2nd ISR LAD. Procedure: LEFT HEART CATHETERIZATION WITH CORONARY ANGIOGRAM;  Surgeon: [name],  MD;  Location: [facility] CATH LAB;  Service: Cardiovascular;  Laterality: N/A;  . PERCUTANEOUS CORONARY STENT INTERVENTION (PCI-S)  07/08/2012   Procedure: PERCUTANEOUS CORONARY STENT INTERVENTION (PCI-S);  Surgeon: [name], MD;  Location: [facility] CATH LAB;  Service: Cardiovascular;;  DES LAD  . PERCUTANEOUS CORONARY STENT INTERVENTION (PCI-S) N/A 09/06/2013   Procedure: PERCUTANEOUS CORONARY STENT INTERVENTION (PCI-S);  Surgeon: [name], MD;  Location: [facility] CATH LAB;  Service: Cardiovascular;  Laterality: N/A;  Mid LAD 3.0/24mm Promus       Home Medications    Prior to Admission medications   Medication Sig Start Date End Date Taking? Authorizing Provider  acetaminophen (TYLENOL) 325 MG tablet Take 2 tablets (650 mg total) by mouth every 4 (four) hours as needed for headache or mild pain. Patient not taking: Reported on 08/04/2016 09/09/13   [name], PA-C  aspirin EC 81 MG tablet Take 81 mg by mouth daily.     [provider]  atorvastatin (LIPITOR) 80 MG tablet Take 80 mg by mouth daily.    [provider]  Continuous Blood Gluc Receiver (FREESTYLE LIBRE READER) DEVI 1 packet by Does not apply route daily. 08/04/16   Rexene Alberts, MD  Continuous Blood Gluc Sensor ([place]) MISC 1 Piece by Does not apply route continuous. 08/04/16   Rexene Alberts, MD  diclofenac sodium (VOLTAREN) 1 % GEL Apply 4 g topically 4 (four) times daily.    [provider]  Diclofenac Sodium 3 % GEL Apply to right shoulder three times daily 10/25/16   Lily Kocher, PA-C  docusate sodium (COLACE) 100 MG capsule Take 100 mg by mouth 2 (two) times daily.    [provider]  glipiZIDE (GLUCOTROL) 5 MG tablet Take 5 mg by mouth 2 (two) times daily.    [provider]  HYDROcodone-acetaminophen (NORCO/VICODIN) 5-325 MG tablet Take 1-2 tablets by mouth every 6 (six) hours as needed. 10/28/16   Volanda Napoleon, PA-C  hydrocortisone cream  0.5 % Apply 1 application topically 2 (two) times daily.    [provider]  isosorbide mononitrate (IMDUR) 30 MG 24 hr tablet Take 1 tablet (30 mg total) by mouth daily. 09/24/15 05/24/16  Lendon Colonel, NP  lisinopril (PRINIVIL,ZESTRIL) 2.5 MG tablet Take 2.5 mg by mouth daily.    [provider]  metFORMIN (GLUCOPHAGE) 1000 MG tablet Take 1 tablet (1,000 mg total) by mouth 2 (two) times daily with a meal. Do not restart Metformin until Wednesday am 7/2 Patient not taking: Reported on 08/04/2016 07/10/12   [name], MD  metoprolol succinate (TOPROL-XL) 25 MG 24 hr tablet Take 25 mg by mouth daily.    [provider]  nitroGLYCERIN (NITROSTAT) 0.4 MG SL tablet Place 1 tablet (0.4 mg total) under the tongue every 5 (five) minutes x 3 doses as needed for chest pain. Patient not taking: Reported on 08/04/2016 05/03/14   Thurnell Lose, MD  ticagrelor (BRILINTA) 90 MG TABS tablet Take 90 mg by mouth 2 (two)  times daily.    [provider]  traMADol (ULTRAM) 50 MG tablet Take 1 tablet (50 mg total) by mouth every 6 (six) hours as needed. 10/25/16   Lily Kocher, PA-C  Vitamin D, Ergocalciferol, (DRISDOL) 50000 units CAPS capsule Take 50,000 Units by mouth every 7 (seven) days.    [provider]    Family History Family History  Problem Relation Age of Onset  . Hypertension Mother   . Diabetes Mother   . Hypertension Father   . Diabetes Father   . Hypertension Sister   . Diabetes Sister     Social History Social History  Substance Use Topics  . Smoking status: Current Every Day Smoker    Packs/day: 0.50    Types: Cigarettes  . Smokeless tobacco: Never Used  . Alcohol use Yes     Comment: occ     Allergies   Patient has no known allergies.   Review of Systems Review of Systems  Constitutional: Negative for appetite change, fatigue and malaise/fatigue.  HENT: Negative for congestion, ear discharge and sinus pressure.     Eyes: Negative for discharge.  Respiratory: Negative for cough.   Cardiovascular: Positive for chest pain.  Gastrointestinal: Negative for abdominal pain and diarrhea.  Genitourinary: Negative for frequency and hematuria.  Musculoskeletal: Negative for back pain.  Skin: Negative for rash.  Neurological: Negative for seizures and headaches.  Psychiatric/Behavioral: Negative for hallucinations.     Physical Exam Updated Vital Signs BP 123/69   Pulse 71   Temp (!) 97.5 F (36.4 C) (Oral)   Resp 18   Ht 5\' 9"  (1.753 m)   Wt 104.8 kg (231 lb)   SpO2 92%   BMI 34.11 kg/m   Physical Exam  Constitutional: He is oriented to person, place, and time. He appears well-developed.  HENT:  Head: Normocephalic.  Eyes: Conjunctivae and EOM are normal. No scleral icterus.  Neck: Neck supple. No thyromegaly present.  Cardiovascular: Normal rate and regular rhythm.  Exam reveals no gallop and no friction rub.   No murmur heard. Pulmonary/Chest: No stridor. He has no wheezes. He has no rales. He exhibits no tenderness.  Abdominal: He exhibits no distension. There is no tenderness. There is no rebound.  Musculoskeletal: Normal range of motion. He exhibits no edema.  Lymphadenopathy:    He has no cervical adenopathy.  Neurological: He is oriented to person, place, and time. He exhibits normal muscle tone. Coordination normal.  Skin: No rash noted. No erythema.  Psychiatric: He has a normal mood and affect. His behavior is normal.     ED Treatments / Results  Labs (all labs ordered are listed, but only abnormal results are displayed) Labs Reviewed  CBC WITH DIFFERENTIAL/PLATELET - Abnormal; Notable for the following:       Result Value   MCH 25.7 (*)    RDW 16.8 (*)    All other components within normal limits  COMPREHENSIVE METABOLIC PANEL - Abnormal; Notable for the following:    Glucose, Bld 103 (*)    Calcium 8.7 (*)    Albumin 3.4 (*)    AST 13 (*)    ALT 13 (*)    All other  components within normal limits  I-STAT CHEM 8, ED - Abnormal; Notable for the following:    Chloride 100 (*)    Glucose, Bld 101 (*)    Calcium, Ion 1.14 (*)    All other components within normal limits  TROPONIN I  I-STAT TROPONIN, ED  EKG  EKG Interpretation None       Radiology Dg Chest Portable 1 View  Result Date: 11/03/2016 CLINICAL DATA:  LEFT chest pain radiating to LEFT shoulder and down LEFT arm, shortness of breath beginning this morning, productive cough for 2-3 days, history CHF, diabetes mellitus, hypertension, coronary disease post MI and PTCA EXAM: PORTABLE CHEST 1 VIEW COMPARISON:  Portable exam 1726 hours compared to 01/30/2016 FINDINGS: Enlargement of cardiac silhouette with pulmonary vascular congestion. Mediastinal contours normal. Slightly accentuated perihilar markings on RIGHT appear chronic questions minimal scarring in the RIGHT upper lobe. Again seen hazy increased markings in the lingula as well. Minimal RIGHT basilar atelectasis. No definite acute infiltrate, pleural effusion, or pneumothorax. IMPRESSION: Enlargement of cardiac silhouette with pulmonary vascular congestion. RIGHT upper lobe scarring with subsegmental atelectasis at RIGHT base and minimal chronic accentuation of LEFT basilar markings. Electronically Signed   By: Lavonia Dana M.D.   On: 11/03/2016 17:47    Procedures Procedures (including critical care time)  Medications Ordered in ED Medications  gi cocktail (Maalox,Lidocaine,Donnatal) (not administered)  ticagrelor (BRILINTA) tablet 90 mg (not administered)  morphine 4 MG/ML injection 4 mg (4 mg Intravenous Given 11/03/16 1727)  nitroGLYCERIN (NITROSTAT) SL tablet 0.4 mg (0.4 mg Sublingual Given 11/03/16 1815)  aspirin tablet 325 mg (325 mg Oral Given 11/03/16 1727)     Initial Impression / Assessment and Plan / ED Course  I have reviewed the triage vital signs and the nursing notes.  Pertinent labs & imaging results that were  available during my care of the patient were reviewed by me and considered in my medical decision making (see chart for details).   Patient will be admitted for unstable angina and started back on his medicines    Final Clinical Impressions(s) / ED Diagnoses   Final diagnoses:  Unstable angina pectoris [facility])    New Prescriptions New Prescriptions   No medications on file     Milton Ferguson, MD 11/03/16 1933

## 2016-11-03 NOTE — ED Triage Notes (Signed)
Patient reports of left sided chest pain that started this morning while watching tv. States he took 1 nitro with relief, took 1 more nitro with no relief. Rates pain 9/10 at this time.

## 2016-11-04 ENCOUNTER — Encounter (HOSPITAL_COMMUNITY): Payer: Self-pay | Admitting: Physician Assistant

## 2016-11-04 DIAGNOSIS — R079 Chest pain, unspecified: Secondary | ICD-10-CM

## 2016-11-04 DIAGNOSIS — J810 Acute pulmonary edema: Secondary | ICD-10-CM | POA: Diagnosis not present

## 2016-11-04 DIAGNOSIS — Z9111 Patient's noncompliance with dietary regimen: Secondary | ICD-10-CM

## 2016-11-04 DIAGNOSIS — I2510 Atherosclerotic heart disease of native coronary artery without angina pectoris: Secondary | ICD-10-CM | POA: Diagnosis not present

## 2016-11-04 DIAGNOSIS — Z9114 Patient's other noncompliance with medication regimen: Secondary | ICD-10-CM | POA: Diagnosis not present

## 2016-11-04 DIAGNOSIS — Z955 Presence of coronary angioplasty implant and graft: Secondary | ICD-10-CM

## 2016-11-04 DIAGNOSIS — I2583 Coronary atherosclerosis due to lipid rich plaque: Secondary | ICD-10-CM

## 2016-11-04 DIAGNOSIS — I5022 Chronic systolic (congestive) heart failure: Secondary | ICD-10-CM

## 2016-11-04 DIAGNOSIS — E785 Hyperlipidemia, unspecified: Secondary | ICD-10-CM | POA: Diagnosis not present

## 2016-11-04 DIAGNOSIS — I25118 Atherosclerotic heart disease of native coronary artery with other forms of angina pectoris: Secondary | ICD-10-CM | POA: Diagnosis not present

## 2016-11-04 DIAGNOSIS — I10 Essential (primary) hypertension: Secondary | ICD-10-CM | POA: Diagnosis not present

## 2016-11-04 DIAGNOSIS — E1149 Type 2 diabetes mellitus with other diabetic neurological complication: Secondary | ICD-10-CM | POA: Diagnosis not present

## 2016-11-04 DIAGNOSIS — F17200 Nicotine dependence, unspecified, uncomplicated: Secondary | ICD-10-CM | POA: Diagnosis not present

## 2016-11-04 LAB — RAPID URINE DRUG SCREEN, HOSP PERFORMED
Amphetamines: NOT DETECTED
BARBITURATES: POSITIVE — AB
Benzodiazepines: NOT DETECTED
Cocaine: NOT DETECTED
Opiates: POSITIVE — AB
Tetrahydrocannabinol: NOT DETECTED

## 2016-11-04 LAB — BRAIN NATRIURETIC PEPTIDE: B NATRIURETIC PEPTIDE 5: 152 pg/mL — AB (ref 0.0–100.0)

## 2016-11-04 LAB — TROPONIN I: Troponin I: <0.03 ng/mL (ref <0.03)

## 2016-11-04 LAB — GLUCOSE, CAPILLARY: GLUCOSE-CAPILLARY: 130 mg/dL — AB (ref 65–99)

## 2016-11-04 MED ORDER — RANOLAZINE ER 500 MG PO TB12: 500.0000 mg | ORAL_TABLET | Freq: Two times a day (BID) | ORAL | Status: DC

## 2016-11-04 MED ORDER — RANOLAZINE ER 500 MG PO TB12
500.0000 mg | ORAL_TABLET | Freq: Two times a day (BID) | ORAL | 0 refills | Status: DC
Start: 2016-11-04 — End: 2017-02-03

## 2016-11-04 NOTE — Progress Notes (Signed)
Pt states he removed the Nicotine patch that was applied to his right arm on 11/03/2016 on admission.  This morning, a PRN nicotine patch was removed from the Pyxis and applied to left shoulder.  Educated patient on leaving patch in place until removed by nurse at designated time.

## 2016-11-04 NOTE — Progress Notes (Signed)
Pt d/c'd home in stable condition.  Denies chest pain and/or shortness of breath.  All D/C paperwork reviewed with patient.  Encouraged pt to begin new medication for HTN once he arrives home.  Also, Pt encouraged to make a hospital f/u with pcp in [place], [place].  Also, he is to follow up with Dr. [name] and states he will make the appt.  Pt is in stable condition and is d/c'd home via ambulation at his preference.

## 2016-11-04 NOTE — Discharge Instructions (Signed)
Follow with Primary MD  [name], MD  and other consultant's as instructed your Hospitalist MD  Please get a complete blood count and chemistry panel checked by your Primary MD at your next visit, and again as instructed by your Primary MD.  Get Medicines reviewed and adjusted: Please take all your medications with you for your next visit with your Primary MD  Laboratory/radiological data: Please request your Primary MD to go over all hospital tests and procedure/radiological results at the follow up, please ask your Primary MD to get all Hospital records sent to his/her office.  In some cases, they will be blood work, cultures and biopsy results pending at the time of your discharge. Please request that your primary care M.D. follows up on these results.  Also Note the following: If you experience worsening of your admission symptoms, develop shortness of breath, life threatening emergency, suicidal or homicidal thoughts you must seek medical attention immediately by calling 911 or calling your MD immediately  if symptoms less severe.  You must read complete instructions/literature along with all the possible adverse reactions/side effects for all the Medicines you take and that have been prescribed to you. Take any new Medicines after you have completely understood and accpet all the possible adverse reactions/side effects.   Do not drive when taking Pain medications or sleeping medications (Benzodaizepines)  Do not take more than prescribed Pain, Sleep and Anxiety Medications. It is not advisable to combine anxiety,sleep and pain medications without talking with your primary care practitioner  Special Instructions: If you have smoked or chewed Tobacco  in the last 2 yrs please stop smoking, stop any regular Alcohol  and or any Recreational drug use.  Wear Seat belts while driving.  Please note: You were cared for by a hospitalist during your hospital stay. Once you are discharged,  your primary care physician will handle any further medical issues. Please note that NO REFILLS for any discharge medications will be authorized once you are discharged, as it is imperative that you return to your primary care physician (or establish a relationship with a primary care physician if you do not have one) for your post hospital discharge needs so that they can reassess your need for medications and monitor your lab values.

## 2016-11-04 NOTE — Consult Note (Signed)
Cardiology Consultation:   Patient ID: [name]; [id number]; [birth year]   Admit date: 11/03/2016 Date of Consult: 11/04/2016  Primary Care Provider: [name], MD Primary Cardiologist: Dr. [name]  Chief Complaint: chest pain  Patient Profile:   [name] is a 61 y.o. male with a hx of CAD s/p prior LAD stenting then mRCA DES in 08/2015, ischemic cardiomyopathy EF 45-50%, DM, CKD (?stage 2), HTN, HLD, obesity, tobacco abuse, lumbar disc disease, obesity, cocaine/crack abuse, noncompliance who is being seen today for the evaluation of chest pain at the request of Dr. Olevia Bowens.  History of Present Illness:  [name] had reviewed/relayed cath notes from [place] 04/2016: "Cardiac catheterization on 04/15/2016 revealed multivessel CAD, with multivessel disease with recommendations for medical management. Patent stents in PLAD and RCA. Diffuse distal LAD, OM2 and RPDA disease.elevated LVEDP, no lesion to suggest ACS." At last OV 05/2016, Dr. [name] noted that although the [facility] had switched the patient from Brilinta to Plavix, he had not been taking Plavix as prescribed. He also has been following at [facility] and [place]. Last echo 08/2015 showed EF 45-50% with HK of mid apical inferior and apical myocardium, HK of apical anteroseptal myocardium, mild MR, mod LAE.  The patient reveals he stopped all medicines besides aspirin and metformin back in March because they "just didn't make me feel good." He says he feels significantly better without it and does not wish to resume anything. He has been very active lately mowing grass without any exertional symptoms. Yesterday from 5-9am he developed sensation of "gas" buildup in his chest while at rest, which would periodically move around his chest. No SOB, n/v/diaphoresis. He took SL NTG without immediate relief, only gradual resolution of symptoms. Yesterday evening while a friend was visiting he developed recurrent discomfort again  persisting for hours unrelieved with SL NTG. He states pain was partially relieved by morphine in the ED then completely resolved immediately after GI cocktail. Denies recent ETOH or cocaine use. Pain is worse with exertion, inspiration or palpation. No recent LEE, orthopnea, dyspnea. Labs show K 3.7, Cr 1.11, albumin 1.4, Mg 1.9, troponins neg x 4, Hgb wnl. CXR with enlargement of cardiac silhouette with pulmonary vascular congestion. VSS, not tachycardic, tachypneic or hypoxic. He was re-loaded with 300mg  of Plavix and restarted on 75mg  daily, along with metoprolol. However, he is refusing metoprolol this morning. He states he just doesn't see the point of staying on medicine that makes him feel bad. He is upset this morning because he did not receive salt on his tray. He uses a fair amount at home. He feels much better this morning.   Past Medical History:  Diagnosis Date  . Bulging lumbar disc   . CAD (coronary artery disease) [phone]   a. prior LAD stenting. b. s/p DES to [facility] 08/2015 @ [facility]. c. 04/2016 Cardiac cath at [facility] # vessel CAD, with patent stent in the PLAD and RCA. Diffuse dLAD, OM2, and  RPDA disease. No aortic stenosis. Elevated LVEDP. No lesion to suggest ACS. Continue medical managment.  . CKD (chronic kidney disease), stage II   . Cocaine abuse ([place])   . DM2 (diabetes mellitus, type 2) ([place])   . HTN (hypertension)   . Hyperlipidemia   . Ischemic cardiomyopathy   . MI, old   . Obesity   . Tobacco use     Past Surgical History:  Procedure Laterality Date  . CARDIAC CATHETERIZATION N/A 09/07/2015   Procedure: Left  Heart Cath and Coronary Angiography;  Surgeon: Leonie Man, MD;  Location: [facility];  Service: Cardiovascular;  Laterality: N/A;  . CARDIAC CATHETERIZATION N/A 09/07/2015   Procedure: Coronary Stent Intervention;  Surgeon: Leonie Man, MD;  Location: [facility];  Service: Cardiovascular;  Laterality: N/A;  . CORONARY ANGIOGRAM   09/07/13   residual RCA and OM disease  . CORONARY ANGIOPLASTY WITH STENT PLACEMENT    . LEFT HEART CATH Bilateral 07/08/2012   Procedure: LEFT HEART CATH;  Surgeon: [name], MD;  Location: [facility] CATH LAB;  Service: Cardiovascular;  Laterality: Bilateral;  . LEFT HEART CATHETERIZATION WITH CORONARY ANGIOGRAM N/A 09/06/2013   STEMI, 2nd ISR LAD. Procedure: LEFT HEART CATHETERIZATION WITH CORONARY ANGIOGRAM;  Surgeon: [name], MD;  Location: [facility] CATH LAB;  Service: Cardiovascular;  Laterality: N/A;  . PERCUTANEOUS CORONARY STENT INTERVENTION (PCI-S)  07/08/2012   Procedure: PERCUTANEOUS CORONARY STENT INTERVENTION (PCI-S);  Surgeon: [name], MD;  Location: [facility] CATH LAB;  Service: Cardiovascular;;  DES LAD  . PERCUTANEOUS CORONARY STENT INTERVENTION (PCI-S) N/A 09/06/2013   Procedure: PERCUTANEOUS CORONARY STENT INTERVENTION (PCI-S);  Surgeon: [name], MD;  Location: [facility] CATH LAB;  Service: Cardiovascular;  Laterality: N/A;  Mid LAD 3.0/24mm Promus     Inpatient Medications: Scheduled Meds: . aspirin EC  81 mg Oral Daily  . clopidogrel  75 mg Oral Daily  . enoxaparin (LOVENOX) injection  40 mg Subcutaneous Q24H  . insulin aspart  0-15 Units Subcutaneous TID WC  . metFORMIN  1,000 mg Oral BID WC  . metoprolol tartrate  25 mg Oral BID   Continuous Infusions:  PRN Meds: acetaminophen, ALPRAZolam, docusate sodium, ipratropium-albuterol, morphine injection, nicotine, nitroGLYCERIN, ondansetron (ZOFRAN) IV, traMADol  Home Meds: Prior to Admission medications   Medication Sig Start Date End Date Taking? Authorizing Provider  aspirin EC 81 MG tablet Take 81 mg by mouth daily.    Yes [provider]  docusate sodium (COLACE) 100 MG capsule Take 100 mg by mouth daily as needed for mild constipation.    Yes [provider]  HYDROcodone-acetaminophen (NORCO/VICODIN) 5-325 MG tablet Take 1-2 tablets by mouth every 6 (six) hours as needed.  10/28/16  Yes Providence Lanius A, PA-C  magnesium citrate SOLN Take 1 Bottle by mouth once.   Yes [provider]  metFORMIN (GLUCOPHAGE) 1000 MG tablet Take 1 tablet (1,000 mg total) by mouth 2 (two) times daily with a meal. Do not restart Metformin until Wednesday am 7/2 07/10/12  Yes Turner, Eber Hong, MD  nitroGLYCERIN (NITROSTAT) 0.4 MG SL tablet Place 1 tablet (0.4 mg total) under the tongue every 5 (five) minutes x 3 doses as needed for chest pain. 05/03/14  Yes Thurnell Lose, MD  traMADol (ULTRAM) 50 MG tablet Take 1 tablet (50 mg total) by mouth every 6 (six) hours as needed. 10/25/16  Yes Lily Kocher, PA-C  Continuous Blood Gluc Receiver (FREESTYLE LIBRE READER) [place] 1 packet by Does not apply route daily. 08/04/16   Rexene Alberts, MD  Continuous Blood Gluc Sensor ([place]) MISC 1 Piece by Does not apply route continuous. 08/04/16   Rexene Alberts, MD    Allergies:   No Known Allergies  Social History:   Social History   Social History  . Marital status: Divorced    Spouse name: N/A  . Number of children: N/A  . Years of education: N/A   Occupational History  . Not on file.  Social History Main Topics  . Smoking status: Current Every Day Smoker    Packs/day: 0.50    Types: Cigarettes  . Smokeless tobacco: Never Used  . Alcohol use Yes     Comment: occ  . Drug use: No     Comment: stopped 1 year ago as of 10/25/16  . Sexual activity: Yes   Other Topics Concern  . Not on file   Social History Narrative   ** Merged History Encounter **        Family History:   The patient's family history includes Diabetes in his father, mother, and sister; Hypertension in his father, mother, and sister.  ROS:  Please see the history of present illness.  All other ROS reviewed and negative.     Physical Exam/Data:   Vitals:   11/03/16 1831 11/03/16 1930 11/03/16 2042 11/04/16 0610  BP: 123/69 109/64 127/89 105/69  Pulse: 71 69 72 80  Resp:  18 (!) 25 (!) 22 20  Temp:   97.8 F (36.6 C) 98.2 F (36.8 C)  TempSrc:   Oral Oral  SpO2: 92% 94% 95% 98%  Weight:   232 lb 5.8 oz (105.4 kg)   Height:   5\' 9"  (1.753 m)     Intake/Output Summary (Last 24 hours) at 11/04/16 0826 Last data filed at 11/03/16 2200  Gross per 24 hour  Intake              240 ml  Output                0 ml  Net              240 ml   Filed Weights   11/03/16 1706 11/03/16 2042  Weight: 231 lb (104.8 kg) 232 lb 5.8 oz (105.4 kg)   Body mass index is 34.31 kg/m.  General: Well developed, well nourished obese AAM in no acute distress. Head: Normocephalic, atraumatic, sclera non-icteric, no xanthomas, nares are without discharge.  Neck: Negative for carotid bruits. JVD not elevated. Lungs: Faint bibasilar crackes, no wheezes/rales/rhonchi. Breathing is unlabored. Heart: RRR with S1 S2. No murmurs, rubs, or gallops appreciated. Abdomen: Soft, non-tender, non-distended with normoactive bowel sounds. No hepatomegaly. No rebound/guarding. No obvious abdominal masses. Msk:  Strength and tone appear normal for age. Extremities: No clubbing or cyanosis. No edema.  Distal pedal pulses are 2+ and equal bilaterally. Neuro: Alert and oriented X 3. No facial asymmetry. No focal deficit. Moves all extremities spontaneously. Psych:  Responds to questions appropriately with a normal affect.  EKG:  The EKG was personally reviewed and demonstrates NSR 65bpm, left axis deviation, prior inferior and anterolateral infarct, no acute changes, TWI I , avL  Relevant CV Studies: Referred to above  Laboratory Data:  Chemistry  Recent Labs Lab 11/03/16 1723 11/03/16 1727  NA 136 139  K 3.7 3.7  CL 101 100*  CO2 27  --   GLUCOSE 103* 101*  BUN 13 12  CREATININE 1.11 1.10  CALCIUM 8.7*  --   GFRNONAA >60  --   GFRAA >60  --   ANIONGAP 8  --      Recent Labs Lab 11/03/16 1723  PROT 7.3  ALBUMIN 3.4*  AST 13*  ALT 13*  ALKPHOS 80  BILITOT 0.4    Hematology  Recent Labs Lab 11/03/16 1723 11/03/16 1727  WBC 9.9  --   RBC 5.13  --   HGB 13.2 14.6  HCT 40.7 43.0  MCV  79.3  --   MCH 25.7*  --   MCHC 32.4  --   RDW 16.8*  --   PLT 265  --    Cardiac Enzymes  Recent Labs Lab 11/03/16 1723 11/03/16 2253 11/04/16 0442  TROPONINI <0.03 <0.03 <0.03     Recent Labs Lab 11/03/16 1723  TROPIPOC 0.00    BNPNo results for input(s): BNP, PROBNP in the last 168 hours.  DDimer No results for input(s): DDIMER in the last 168 hours.  Radiology/Studies:  Dg Chest Portable 1 View  Result Date: 11/03/2016 CLINICAL DATA:  LEFT chest pain radiating to LEFT shoulder and down LEFT arm, shortness of breath beginning this morning, productive cough for 2-3 days, history CHF, diabetes mellitus, hypertension, coronary disease post MI and PTCA EXAM: PORTABLE CHEST 1 VIEW COMPARISON:  Portable exam 1726 hours compared to 01/30/2016 FINDINGS: Enlargement of cardiac silhouette with pulmonary vascular congestion. Mediastinal contours normal. Slightly accentuated perihilar markings on RIGHT appear chronic questions minimal scarring in the RIGHT upper lobe. Again seen hazy increased markings in the lingula as well. Minimal RIGHT basilar atelectasis. No definite acute infiltrate, pleural effusion, or pneumothorax. IMPRESSION: Enlargement of cardiac silhouette with pulmonary vascular congestion. RIGHT upper lobe scarring with subsegmental atelectasis at RIGHT base and minimal chronic accentuation of LEFT basilar markings. Electronically Signed   By: Lavonia Dana M.D.   On: 11/03/2016 17:47    Assessment and Plan:   1. Chest pain in setting of known history of CAD - chest pain is somewhat atypical, relieved completely with GI cocktail and a burp. Both episodes lasted several hours yesterday so in theory if this was ischemic pain, would anticipate at least some change in troponin or EKG. This is not the case, which raises question of GI etiology.  Furthermore, Mr. [name]'s resistance to re-initiation medication therapy and historical noncompliance would make him high risk if recurrent cath/PCI were to be performed as he would be at risk for acute stent thrombosis if he does not take his medicines. In the absence of obvious signs of ischemia, will review further workup (if any as inpatient) with Dr. [name]. He does have some crackles on exam. Will add BNP to labs. CXR shows enlarged cardiac silhouette so will also ask MD to review. Radiology reports mediastinal contours as normal; these appear similar compared to prior CXRs.  3. Ischemic cardiomyopathy - resistant to idea of medication therapy; also limited by patient's BP. Discussed avoidance of sodium in diet but I am not convinced of his future compliance.  4. HTN - BP actually borderline low at times. Question whether if this is why metoprolol made him feel poorly in the past (although he says he cannot specifically say one med did it over another). Will hold beta blocker and review with MD.  4. H/o cocaine abuse - he reports clean since 03/2016. Check UDS for completeness.   Signed, Charlie Pitter, PA-C  11/04/2016 8:26 AM   The patient was seen and examined, and I agree with the history, physical exam, assessment and plan as documented above, with modifications as noted below. I have also personally reviewed all relevant documentation, old records, labs, and both radiographic and cardiovascular studies. I have also independently interpreted old and new ECG's.  61 yr old male known to me from clinic with CAD and stents to LAD and RCA admitted with chest pain. He is noncompliant with medical therapy as noted above. He also sees multiple physicians. He stopped all meds but ASA and  metformin on his own and never tried reinitiating even one of them due to feeling poorly. Chest pain was alleviated with GI cocktail and belching. He had taken nitroglycerin at home without relief.  Troponins  are normal and ECG is unchanged.  BNP is minimally elevated. He has some basilar crackles on exam.  I had a lengthy discussion with him about the importance of sticking to one PCP and one cardiologist, as well as notifying them should he decide to start/stop taking meds, so that providers can assist with symptom management.  Low normal BP (and medication compliance) limit ability to titrate anti-anginal meds (beta blockers, nitrates, etc).  I will try adding ranolazine 500 mg bid but I do not have a lot of faith he will take it at home. At least this will not affect BP.  No noninvasive testing is warranted at this time.   [name], MD, [facility]  11/04/2016 10:55 AM

## 2016-11-04 NOTE — Care Management Obs Status (Signed)
[place] NOTIFICATION   Patient Details  Name: [name] MRN: [id number] Date of Birth: June 18, 1955   Medicare Observation Status Notification Given:  Other (see comment) (Pt discharged <24 hrs)    Sherald Barge, RN 11/04/2016, 11:54 AM

## 2016-11-04 NOTE — Discharge Summary (Signed)
Physician Discharge Summary  [name] QPY:[id number] DOB: 06-02-1955 DOA: 11/03/2016  PCP: [name], MD Cardiologist: Dr. [name]  Admit date: 11/03/2016 Discharge date: 11/04/2016  Admitted From: Home  Disposition: Home   Recommendations for Outpatient Follow-up:  1. Follow up with PCP in 1 weeks 2. Follow up with cardiologist in 2-3 weeks 3. Please obtain BMP/CBC in one week  Discharge Condition: STABLE   CODE STATUS: FULL    Brief Hospitalization Summary: Please see all hospital notes, images, labs for full details of the hospitalization. From HPI: [name] is a 61 y.o. male with medical history significant of chronic back pain, history of bulging lumbar disks, CAD/history of MI/stent placement 3, chronic systolic CHF, type 2 diabetes, chronic renal disease secondary to diabetes, hypertension, hyperlipidemia, obesity, tobacco use who is coming to the emergency department with complaints of having two chest pain episodes today.  Per patient, this morning while he was watching TV he developed precordial pressure-like chest pain (felt like gas was in his chest), radiates to his epigastric area, neck and left upper extremity all the way from the arm down to the hand. Associated symptoms were mild dyspnea, but he denies nausea, emesis, diaphoresis and dizziness. He mentions that these symptoms were similar to when he had an MI. He denies PND, orthopnea or recent pitting edema lower extremities. He is an active smoker of half a pack of cigarettes a day, complaints of occasional productive cough and occasional wheezing. He denies EtOH use or recreational drug use.  ED Course: Initial vital signs temperature 97.6F, pulse 78, respirations 20, blood pressure 136/89 mmHg and O2 sat 93% on room air. EKG showed sinus versus ectopic rhythm, anterior/inferior old infarct. No significant changes when compared to previous tracing. I-STAT troponin and serum troponin were  negative. CBC was normal. CMP showed a nonfasting glucose of 103, calcium 8.7 mg/dL. Albumin was 3.4 g/dL. AST and ALT were both 13 units. The rest of the chemistry values were unremarkable.  Imaging: His chest radiograph showed enlargement of cardiac silhouette with pulmonary vascular congestion. RIGHT upper lobe scarring with subsegmental atelectasis at RIGHT base and minimal chronic accentuation of LEFT basilar markings.  The patient was admitted and symptoms resolved with GI cocktail.  Pt refused to take his prescribed meds in hospital.  He was seen by cardiology service and they noted below:   61 yr old male known to me from clinic with CAD and stents to LAD and RCA admitted with chest pain. He is noncompliant with medical therapy as noted above. He also sees multiple physicians. He stopped all meds but ASA and metformin on his own and never tried reinitiating even one of them due to feeling poorly. Chest pain was alleviated with GI cocktail and belching. He had taken nitroglycerin at home without relief.  Troponins are normal and ECG is unchanged.  BNP is minimally elevated. He has some basilar crackles on exam.  I had a lengthy discussion with him about the importance of sticking to one PCP and one cardiologist, as well as notifying them should he decide to start/stop taking meds, so that providers can assist with symptom management.  Low normal BP (and medication compliance) limit ability to titrate anti-anginal meds (beta blockers, nitrates, etc).  I will try adding ranolazine 500 mg bid but I do not have a lot of faith he will take it at home. At least this will not affect BP.  No noninvasive testing is warranted at this time.  [name], MD, [facility]   He is being discharged home to follow up outpatient.    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    Complete by:  As directed    Call MD for:  difficulty breathing, headache or visual disturbances    Complete by:  As directed    Call MD for:  extreme fatigue    Complete by:  As directed    Call MD for:  hives    Complete by:  As directed    Call MD for:  persistant dizziness or light-headedness    Complete by:  As directed    Call MD for:  persistant nausea and vomiting    Complete by:  As directed    Call MD for:  severe uncontrolled pain    Complete by:  As directed    Increase activity slowly    Complete by:  As directed      Allergies as of 11/04/2016   No Known Allergies     Medication List    STOP taking these medications   HYDROcodone-acetaminophen 5-325 MG tablet Commonly known as:  NORCO/VICODIN   magnesium citrate Soln     TAKE these medications   aspirin EC 81 MG tablet Take 81 mg by mouth daily.   docusate sodium 100 MG capsule Commonly known as:  COLACE Take 100 mg by mouth daily as needed for mild constipation.   FREESTYLE LIBRE READER Devi 1 packet by Does not apply route daily.   FREESTYLE LIBRE SENSOR SYSTEM Misc 1 Piece by Does not apply route continuous.   metFORMIN 1000 MG tablet Commonly known as:  GLUCOPHAGE Take 1 tablet (1,000 mg total) by mouth 2 (two) times daily with a meal. Do not restart Metformin until Wednesday am 7/2   nitroGLYCERIN 0.4 MG SL tablet Commonly known as:  NITROSTAT Place 1 tablet (0.4 mg total) under the tongue every 5 (five) minutes x 3 doses as needed for  chest pain.   ranolazine 500 MG 12 hr tablet Commonly known as:  RANEXA Take 1 tablet (500 mg total) by mouth 2 (two) times daily.   traMADol 50 MG tablet Commonly known as:  ULTRAM Take 1 tablet (50 mg total) by mouth every 6 (six) hours as needed.      Follow-up Information    Herminio Commons, MD Follow up in 3 week(s).   Specialty:  Cardiology Why:  Hospital Follow Up  Contact information: [place] [address] [phone]        [name], MD. Schedule an appointment as soon as possible for a visit in 1 week(s).   Specialty:  Nurse Practitioner Why:  Hospital Follow Up  Contact information: [place] [address] [phone]          No Known Allergies Current  Discharge Medication List    START taking these medications   Details  ranolazine (RANEXA) 500 MG 12 hr tablet Take 1 tablet (500 mg total) by mouth 2 (two) times daily. Qty: 60 tablet, Refills: 0      CONTINUE these medications which have NOT CHANGED   Details  aspirin EC 81 MG tablet Take 81 mg by mouth daily.     docusate sodium (COLACE) 100 MG capsule Take 100 mg by mouth daily as needed for mild constipation.     metFORMIN (GLUCOPHAGE) 1000 MG tablet Take 1 tablet (1,000 mg total) by mouth 2 (two) times daily with a meal. Do not restart Metformin until Wednesday am 7/2    nitroGLYCERIN (NITROSTAT) 0.4 MG SL tablet Place 1 tablet (0.4 mg total) under the tongue every 5 (five) minutes x 3 doses as needed for chest pain. Qty: 25 tablet, Refills: 12    traMADol (ULTRAM) 50 MG tablet Take 1 tablet (50 mg total) by mouth every 6 (six) hours as needed. Qty: 15 tablet, Refills: 0    Continuous Blood Gluc Receiver (FREESTYLE LIBRE READER) DEVI 1 packet by Does not apply route daily. Qty: 1 Device, Refills: 6    Continuous Blood Gluc Sensor (FREESTYLE LIBRE SENSOR SYSTEM) MISC 1 Piece by Does not apply route continuous. Qty: 3 each, Refills: 6       STOP taking these medications     HYDROcodone-acetaminophen (NORCO/VICODIN) 5-325 MG tablet      magnesium citrate SOLN         Procedures/Studies: Dg Shoulder Right  Result Date: 10/28/2016 CLINICAL DATA:  Right shoulder pain, 1 week duration. EXAM: RIGHT SHOULDER - 2+ VIEW COMPARISON:  None. FINDINGS: There is osteoarthritis of the glenohumeral joint with joint space narrowing and marginal osteophytes. I think there has been previous distal clavicular resection. Humeral acromial distance within normal limits. IMPRESSION: Glenohumeral degenerative changes. No acute findings. Probable previous distal clavicular resection. Electronically Signed   By: Nelson Chimes M.D.   On: 10/28/2016 10:36   Dg Chest Portable 1 View  Result Date: 11/03/2016 CLINICAL DATA:  LEFT chest pain radiating to LEFT shoulder and down LEFT arm, shortness of breath beginning this morning, productive cough for 2-3 days, history CHF, diabetes mellitus, hypertension, coronary disease post MI and PTCA EXAM: PORTABLE CHEST 1 VIEW COMPARISON:  Portable exam 1726 hours compared to 01/30/2016 FINDINGS: Enlargement of cardiac silhouette with pulmonary vascular congestion. Mediastinal contours normal. Slightly accentuated perihilar markings on RIGHT appear chronic questions minimal scarring in the RIGHT upper lobe. Again seen hazy increased markings in the lingula as well. Minimal RIGHT basilar atelectasis. No definite acute infiltrate, pleural effusion, or pneumothorax. IMPRESSION: Enlargement of cardiac silhouette with pulmonary vascular congestion. RIGHT upper lobe scarring with subsegmental atelectasis at RIGHT base and minimal chronic accentuation of LEFT basilar markings. Electronically Signed   By: Lavonia Dana M.D.   On: 11/03/2016 17:47      Subjective: Pt says that his pain has resolved with the GI cocktail.    Discharge Exam: Vitals:   11/03/16 2042 11/04/16 0610  BP: 127/89 105/69  Pulse: 72 80  Resp: (!) 22 20   Temp: 97.8 F (36.6 C) 98.2 F (36.8 C)  SpO2: 95% 98%   Vitals:   11/03/16 1831 11/03/16 1930 11/03/16 2042 11/04/16 0610  BP: 123/69 109/64 127/89 105/69  Pulse: 71 69 72 80  Resp: 18 (!) 25 (!) 22 20  Temp:   97.8 F (36.6 C) 98.2 F (36.8 C)  TempSrc:   Oral Oral  SpO2: 92% 94% 95% 98%  Weight:   105.4 kg (232 lb 5.8 oz)   Height:   5\' 9"  (1.753 m)    General: Pt is alert, awake, not in acute distress Cardiovascular: RRR, S1/S2 +, no rubs, no gallops Respiratory: CTA bilaterally, no wheezing, no rhonchi Abdominal: Soft, NT, ND, bowel sounds + Extremities: no edema, no cyanosis   The results of significant diagnostics from this hospitalization (including imaging, microbiology, ancillary and laboratory) are listed below for reference.     Microbiology: No results found for this or any previous visit (from the past 240 hour(s)).   Labs: BNP (last 3 results)  Recent Labs  11/04/16 0536  BNP 235.5*   Basic Metabolic Panel:  Recent Labs Lab 11/03/16 1723 11/03/16 1727 11/03/16 1930  NA 136 139  --   K 3.7 3.7  --   CL 101 100*  --   CO2 27  --   --   GLUCOSE 103* 101*  --   BUN 13 12  --   CREATININE 1.11 1.10  --   CALCIUM 8.7*  --   --   MG  --   --  1.9   Liver Function Tests:  Recent Labs Lab 11/03/16 1723  AST 13*  ALT 13*  ALKPHOS 80  BILITOT 0.4  PROT 7.3  ALBUMIN 3.4*   No results for input(s): LIPASE, AMYLASE in the last 168 hours. No results for input(s): AMMONIA in the last 168 hours. CBC:  Recent Labs Lab 11/03/16 1723 11/03/16 1727  WBC 9.9  --   NEUTROABS 6.8  --   HGB 13.2 14.6  HCT 40.7 43.0  MCV 79.3  --   PLT 265  --    Cardiac Enzymes:  Recent Labs Lab 11/03/16 1723 11/03/16 2253 11/04/16 0442  TROPONINI <0.03 <0.03 <0.03   BNP: Invalid input(s): POCBNP CBG:  Recent Labs Lab 11/03/16 2129 11/04/16 0737  GLUCAP 98 130*   D-Dimer No results for input(s): DDIMER in the last 72 hours. Hgb A1c No  results for input(s): HGBA1C in the last 72 hours. Lipid Profile No results for input(s): CHOL, HDL, LDLCALC, TRIG, CHOLHDL, LDLDIRECT in the last 72 hours. Thyroid function studies No results for input(s): TSH, T4TOTAL, T3FREE, THYROIDAB in the last 72 hours.  Invalid input(s): FREET3 Anemia work up No results for input(s): VITAMINB12, FOLATE, FERRITIN, TIBC, IRON, RETICCTPCT in the last 72 hours. Urinalysis    Component Value Date/Time   COLORURINE YELLOW 09/05/2015 0958   APPEARANCEUR CLEAR 09/05/2015 0958   LABSPEC 1.034 (H) 09/05/2015 0958   PHURINE 5.0 09/05/2015 0958   GLUCOSEU >1000 (A) 09/05/2015 0958   HGBUR NEGATIVE 09/05/2015 0958   BILIRUBINUR NEGATIVE 09/05/2015 0958   KETONESUR NEGATIVE 09/05/2015 0958   PROTEINUR NEGATIVE 09/05/2015 0958   UROBILINOGEN 0.2 09/27/2014 2237   NITRITE NEGATIVE 09/05/2015 0958   LEUKOCYTESUR NEGATIVE 09/05/2015 0958   Sepsis Labs Invalid input(s): PROCALCITONIN,  WBC,  LACTICIDVEN Microbiology No results found for this or any previous visit (from the past 240 hour(s)).  Time coordinating discharge:  SIGNED:  Irwin Brakeman, MD  [facility] 11/04/2016, 11:12 AM Pager [phone]  If 7PM-7AM, please contact night-coverage [email or web address] Password [email or web address]

## 2016-11-04 NOTE — Progress Notes (Signed)
Pt C/O low back pain and states it is chronic.  Requesting prn.  Offered Ultram and he refused.  Demanding Oxycodone and states it is a medication he took prior, but does not take anymore.  Pt continues to state that a Doctor that was treating his pain in the past refuses to give him anymore Oxycodone due to his continuous use of Cocaine.  Paged Dr. Wynetta Emery, he states patient may have the Ultram and will more than likely be Discharged home today with F/U with Cardiology.  Pt complied and agreed to take the Ultram. Will continue to monitor.

## 2016-11-08 ENCOUNTER — Encounter (HOSPITAL_COMMUNITY): Payer: Self-pay | Admitting: *Deleted

## 2016-11-08 ENCOUNTER — Emergency Department (HOSPITAL_COMMUNITY): Payer: Medicare Other

## 2016-11-08 ENCOUNTER — Emergency Department (HOSPITAL_COMMUNITY)
Admission: EM | Admit: 2016-11-08 | Discharge: 2016-11-08 | Disposition: A | Discharge status: Home / Self Care | Payer: Medicare Other | Attending: Emergency Medicine | Admitting: Emergency Medicine

## 2016-11-08 DIAGNOSIS — Z7982 Long term (current) use of aspirin: Secondary | ICD-10-CM | POA: Diagnosis not present

## 2016-11-08 DIAGNOSIS — N183 Chronic kidney disease, stage 3 (moderate): Secondary | ICD-10-CM | POA: Diagnosis not present

## 2016-11-08 DIAGNOSIS — I5022 Chronic systolic (congestive) heart failure: Secondary | ICD-10-CM | POA: Insufficient documentation

## 2016-11-08 DIAGNOSIS — I252 Old myocardial infarction: Secondary | ICD-10-CM | POA: Insufficient documentation

## 2016-11-08 DIAGNOSIS — E1140 Type 2 diabetes mellitus with diabetic neuropathy, unspecified: Secondary | ICD-10-CM | POA: Diagnosis not present

## 2016-11-08 DIAGNOSIS — I2510 Atherosclerotic heart disease of native coronary artery without angina pectoris: Secondary | ICD-10-CM | POA: Insufficient documentation

## 2016-11-08 DIAGNOSIS — Z7984 Long term (current) use of oral hypoglycemic drugs: Secondary | ICD-10-CM | POA: Insufficient documentation

## 2016-11-08 DIAGNOSIS — I130 Hypertensive heart and chronic kidney disease with heart failure and stage 1 through stage 4 chronic kidney disease, or unspecified chronic kidney disease: Secondary | ICD-10-CM | POA: Diagnosis not present

## 2016-11-08 DIAGNOSIS — E1122 Type 2 diabetes mellitus with diabetic chronic kidney disease: Secondary | ICD-10-CM | POA: Diagnosis not present

## 2016-11-08 DIAGNOSIS — R109 Unspecified abdominal pain: Secondary | ICD-10-CM | POA: Insufficient documentation

## 2016-11-08 DIAGNOSIS — Z955 Presence of coronary angioplasty implant and graft: Secondary | ICD-10-CM | POA: Diagnosis not present

## 2016-11-08 LAB — CBC WITH DIFFERENTIAL/PLATELET
BASOS PCT: 0 %
Basophils Absolute: 0 10*3/uL (ref 0.0–0.1)
EOS ABS: 0.8 10*3/uL — AB (ref 0.0–0.7)
EOS PCT: 7 %
HCT: 45 % (ref 39.0–52.0)
HEMOGLOBIN: 15 g/dL (ref 13.0–17.0)
LYMPHS ABS: 2 10*3/uL (ref 0.7–4.0)
Lymphocytes Relative: 17 %
MCH: 26 pg (ref 26.0–34.0)
MCHC: 33.3 g/dL (ref 30.0–36.0)
MCV: 78 fL (ref 78.0–100.0)
MONO ABS: 0.3 10*3/uL (ref 0.1–1.0)
MONOS PCT: 3 %
NEUTROS PCT: 73 %
Neutro Abs: 8.3 10*3/uL — ABNORMAL HIGH (ref 1.7–7.7)
PLATELETS: 254 10*3/uL (ref 150–400)
RBC: 5.77 MIL/uL (ref 4.22–5.81)
RDW: 16.8 % — AB (ref 11.5–15.5)
WBC: 11.5 10*3/uL — ABNORMAL HIGH (ref 4.0–10.5)

## 2016-11-08 LAB — URINALYSIS, ROUTINE W REFLEX MICROSCOPIC
BACTERIA UA: NONE SEEN
BILIRUBIN URINE: NEGATIVE
Glucose, UA: NEGATIVE mg/dL
Hgb urine dipstick: NEGATIVE
KETONES UR: NEGATIVE mg/dL
LEUKOCYTES UA: NEGATIVE
Nitrite: NEGATIVE
PROTEIN: 100 mg/dL — AB
Specific Gravity, Urine: 1.019 (ref 1.005–1.030)
pH: 5 (ref 5.0–8.0)

## 2016-11-08 LAB — COMPREHENSIVE METABOLIC PANEL
ALBUMIN: 4 g/dL (ref 3.5–5.0)
ALT: 13 U/L — AB (ref 17–63)
AST: 12 U/L — AB (ref 15–41)
Alkaline Phosphatase: 94 U/L (ref 38–126)
Anion gap: 10 (ref 5–15)
BUN: 18 mg/dL (ref 6–20)
CHLORIDE: 103 mmol/L (ref 101–111)
CO2: 27 mmol/L (ref 22–32)
Calcium: 9.4 mg/dL (ref 8.9–10.3)
Creatinine, Ser: 1.25 mg/dL — ABNORMAL HIGH (ref 0.61–1.24)
GFR calc Af Amer: >60 mL/min (ref >60)
GFR calc non Af Amer: >60 mL/min (ref >60)
GLUCOSE: 100 mg/dL — AB (ref 65–99)
POTASSIUM: 4.1 mmol/L (ref 3.5–5.1)
Sodium: 140 mmol/L (ref 135–145)
Total Bilirubin: 0.6 mg/dL (ref 0.3–1.2)
Total Protein: 8.2 g/dL — ABNORMAL HIGH (ref 6.5–8.1)

## 2016-11-08 LAB — LIPASE, BLOOD: Lipase: 27 U/L (ref 11–51)

## 2016-11-08 MED ORDER — CYCLOBENZAPRINE HCL 10 MG PO TABS: 10.0000 mg | ORAL_TABLET | Freq: Two times a day (BID) | ORAL | 0 refills | Status: DC | PRN

## 2016-11-08 MED ORDER — MORPHINE SULFATE (PF) 4 MG/ML IV SOLN
4.0000 mg | Freq: Once | INTRAVENOUS | Status: AC
Start: 2016-11-08 — End: 2016-11-08
  Administered 2016-11-08: 4 mg via INTRAVENOUS
  Filled 2016-11-08: qty 1

## 2016-11-08 MED ORDER — CYCLOBENZAPRINE HCL 10 MG PO TABS
10.0000 mg | ORAL_TABLET | Freq: Once | ORAL | Status: AC
  Administered 2016-11-08: 10 mg via ORAL
  Filled 2016-11-08: qty 1

## 2016-11-08 MED ORDER — TRAMADOL HCL 50 MG PO TABS: 50.0000 mg | ORAL_TABLET | Freq: Four times a day (QID) | ORAL | 0 refills | Status: DC | PRN

## 2016-11-08 NOTE — ED Triage Notes (Signed)
Pt c/o right side pain that radiates to right flank x 1 week. Denies urinary symptoms, n/v/d.

## 2016-11-08 NOTE — Discharge Instructions (Signed)
Take the medications as needed for pain.  Follow-up with your doctor later this week or next week to make sure you are improving.  Return as needed for worsening symptoms

## 2016-11-08 NOTE — ED Provider Notes (Signed)
[facility] EMERGENCY DEPARTMENT Provider Note   CSN: [id number] Arrival date & time: 11/08/16  1553     History   Chief Complaint Chief Complaint  Patient presents with  . Flank Pain    HPI [name] is a 61 y.o. male.  HPI Pt was recently admitted to the hospital on the 25th.  Pt was evaluated for chest pain during that stay.  Pt states he also has been having pain in his right flank area for the last week.  He did not mention it during the stay.  The pain is sharp.  It increases with movement and palpation.  No fevers, no rash.  No coughing.  No vomiting or diarrhea.  No dysuria.  He came into the ED because he does not have an appointment with his [facility] doctor for a while and he has to call to schedule an appointment. Past Medical History:  Diagnosis Date  . Bulging lumbar disc   . CAD (coronary artery disease) [phone]   a. prior LAD stenting. b. s/p DES to [facility] 08/2015 @ [facility]. c. 04/2016 Cardiac cath at [facility]. # vessel CAD, with patent stent in the PLAD and RCA. Diffuse dLAD, OM2, and  RPDA disease. No aortic stenosis. Elevated LVEDP. No lesion to suggest ACS. Continue medical managment.  . CKD (chronic kidney disease), stage II   . Cocaine abuse ([place])   . DM2 (diabetes mellitus, type 2) ([place])   . HTN (hypertension)   . Hyperlipidemia   . Ischemic cardiomyopathy   . MI, old   . Obesity   . Tobacco use     Patient Active Problem List   Diagnosis Date Noted  . Chest pain 11/03/2016  . Constipation 11/03/2016  . CKD (chronic kidney disease), stage II 08/02/2016  . Cellulitis of right hand 08/02/2016  . Chronic pain 08/02/2016  . AKI (acute kidney injury) ([place]) 01/31/2016  . Lobar pneumonia ([place]) 01/31/2016  . Influenza A 01/31/2016  . Diarrhea 01/30/2016  . NSVT (nonsustained ventricular tachycardia) ([place]) 09/12/2015  . Precordial chest pain 09/10/2015  . Cocaine use 09/04/2015  . Near syncope 06/29/2015  . Essential hypertension 06/29/2015  .  History of heart artery stents 06/29/2015  . Insulin dependent diabetes mellitus ([place]) 06/29/2015  . History of MI (myocardial infarction) 06/29/2015  . Chronic systolic CHF 82/42/3536  . Musculoskeletal chest pain 05/27/2015  . Rectal bleeding   . Stage 3 chronic renal impairment associated with type 2 diabetes mellitus ([place]) 09/09/2013  . 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 CAD (coronary artery disease) 09/09/2013  . Cardiomyopathy, ischemic-EF 40-45% by echo 09/07/13 09/09/2013  . S/P LAD DES June 2014 09/06/2013  . Tobacco use disorder 09/06/2013  . Hyperglycemia 08/22/2012  . NSTEMI (non-ST elevated myocardial infarction) ([place]) 07/07/2012  . Dyslipidemia 07/07/2012  . Hypertensive heart disease     Past Surgical History:  Procedure Laterality Date  . CARDIAC CATHETERIZATION N/A 09/07/2015   Procedure: Left Heart Cath and Coronary Angiography;  Surgeon: Leonie Man, MD;  Location: [place] CV LAB;  Service: Cardiovascular;  Laterality: N/A;  . CARDIAC CATHETERIZATION N/A 09/07/2015   Procedure: Coronary Stent Intervention;  Surgeon: Leonie Man, MD;  Location: [place] CV LAB;  Service: Cardiovascular;  Laterality: N/A;  . CORONARY ANGIOGRAM  09/07/13   residual RCA and OM disease  . CORONARY ANGIOPLASTY WITH STENT PLACEMENT    . LEFT HEART CATH Bilateral 07/08/2012   Procedure: LEFT HEART CATH;  Surgeon: [name], MD;  Location: [facility] CATH LAB;  Service: Cardiovascular;  Laterality: Bilateral;  . LEFT HEART CATHETERIZATION WITH CORONARY ANGIOGRAM N/A 09/06/2013   STEMI, 2nd ISR LAD. Procedure: LEFT HEART CATHETERIZATION WITH CORONARY ANGIOGRAM;  Surgeon: [name], MD;  Location: [facility] CATH LAB;  Service: Cardiovascular;  Laterality: N/A;  . PERCUTANEOUS CORONARY STENT INTERVENTION (PCI-S)  07/08/2012   Procedure: PERCUTANEOUS CORONARY STENT INTERVENTION (PCI-S);  Surgeon: [name], MD;  Location: [facility] CATH LAB;  Service:  Cardiovascular;;  DES LAD  . PERCUTANEOUS CORONARY STENT INTERVENTION (PCI-S) N/A 09/06/2013   Procedure: PERCUTANEOUS CORONARY STENT INTERVENTION (PCI-S);  Surgeon: [name], MD;  Location: [facility] CATH LAB;  Service: Cardiovascular;  Laterality: N/A;  Mid LAD 3.0/24mm Promus       Home Medications    Prior to Admission medications   Medication Sig Start Date End Date Taking? Authorizing Provider  aspirin EC 81 MG tablet Take 81 mg by mouth daily.    Yes [provider]  docusate sodium (COLACE) 100 MG capsule Take 100 mg by mouth daily as needed for mild constipation.    Yes [provider]  metFORMIN (GLUCOPHAGE) 1000 MG tablet Take 1 tablet (1,000 mg total) by mouth 2 (two) times daily with a meal. Do not restart Metformin until Wednesday am 7/2 07/10/12  Yes Turner, Eber Hong, MD  nitroGLYCERIN (NITROSTAT) 0.4 MG SL tablet Place 1 tablet (0.4 mg total) under the tongue every 5 (five) minutes x 3 doses as needed for chest pain. 05/03/14  Yes Thurnell Lose, MD  Continuous Blood Gluc Receiver (FREESTYLE LIBRE READER) DEVI 1 packet by Does not apply route daily. 08/04/16   Rexene Alberts, MD  Continuous Blood Gluc Sensor ([place]) MISC 1 Piece by Does not apply route continuous. 08/04/16   Rexene Alberts, MD  cyclobenzaprine (FLEXERIL) 10 MG tablet Take 1 tablet (10 mg total) by mouth 2 (two) times daily as needed for muscle spasms. 11/08/16   Dorie Rank, MD  ranolazine (RANEXA) 500 MG 12 hr tablet Take 1 tablet (500 mg total) by mouth 2 (two) times daily. Patient not taking: Reported on 11/08/2016 11/04/16 12/04/16  Murlean Iba, MD  traMADol (ULTRAM) 50 MG tablet Take 1 tablet (50 mg total) by mouth every 6 (six) hours as needed. 11/08/16   Dorie Rank, MD    Family History Family History  Problem Relation Age of Onset  . Hypertension Mother   . Diabetes Mother   . Hypertension Father   . Diabetes Father   . Hypertension Sister   .  Diabetes Sister     Social History Social History  Substance Use Topics  . Smoking status: Current Every Day Smoker    Packs/day: 0.50    Types: Cigarettes  . Smokeless tobacco: Never Used  . Alcohol use Yes     Comment: occ     Allergies   Patient has no known allergies.   Review of Systems Review of Systems  All other systems reviewed and are negative.    Physical Exam Updated Vital Signs BP 103/76   Pulse 82   Temp (!) 97.5 F (36.4 C) (Oral)   Resp 18   SpO2 99%   Physical Exam  Constitutional: No distress.  HENT:  Head: Normocephalic and atraumatic.  Right Ear: External ear normal.  Left Ear: External ear normal.  Eyes: Conjunctivae are normal. Right eye exhibits no discharge. Left eye exhibits no discharge. No scleral icterus.  Neck: Neck supple. No tracheal deviation present.  Cardiovascular: Normal rate, regular rhythm and intact distal pulses.   Pulmonary/Chest: Effort normal and breath sounds normal. No stridor. No respiratory distress. He has no wheezes. He has no rales.  Abdominal: Soft. Bowel sounds are normal. He exhibits no distension. There is no tenderness. There is CVA tenderness (right). There is no rebound and no guarding.  Musculoskeletal: He exhibits no edema.       Thoracic back: He exhibits tenderness. He exhibits no swelling, no edema and no deformity.       Lumbar back: He exhibits tenderness. He exhibits no swelling, no edema and no deformity.  Neurological: He is alert. He has normal strength. No cranial nerve deficit (no facial droop, extraocular movements intact, no slurred speech) or sensory deficit. He exhibits normal muscle tone. He displays no seizure activity. Coordination normal.  Skin: Skin is warm and dry. No rash noted. He is not diaphoretic.  No rash noted on the flank  Psychiatric: He has a normal mood and affect.  Nursing note and vitals reviewed.    ED Treatments / Results  Labs (all labs ordered are listed, but only  abnormal results are displayed) Labs Reviewed  CBC WITH DIFFERENTIAL/PLATELET - Abnormal; Notable for the following:       Result Value   WBC 11.5 (*)    RDW 16.8 (*)    Neutro Abs 8.3 (*)    Eosinophils Absolute 0.8 (*)    All other components within normal limits  COMPREHENSIVE METABOLIC PANEL - Abnormal; Notable for the following:    Glucose, Bld 100 (*)    Creatinine, Ser 1.25 (*)    Total Protein 8.2 (*)    AST 12 (*)    ALT 13 (*)    All other components within normal limits  URINALYSIS, ROUTINE W REFLEX MICROSCOPIC - Abnormal; Notable for the following:    Protein, ur 100 (*)    Squamous Epithelial / LPF 0-5 (*)    All other components within normal limits  LIPASE, BLOOD     Radiology Dg Chest 2 View  Result Date: 11/08/2016 CLINICAL DATA:  Right flank pain. History of coronary artery disease. EXAM: CHEST  2 VIEW COMPARISON:  11/03/2016 FINDINGS: Enlarged cardiac silhouette.  Mediastinal contours appear intact. There is no evidence of focal airspace consolidation, pleural effusion or pneumothorax. Chronic interstitial lung changes. Osseous structures are without acute abnormality. Soft tissues are grossly normal. IMPRESSION: Enlarged cardiac silhouette. Chronic interstitial lung changes. Electronically Signed   By: Fidela Salisbury M.D.   On: 11/08/2016 19:11    Procedures Procedures (including critical care time)  Medications Ordered in ED Medications  cyclobenzaprine (FLEXERIL) tablet 10 mg (not administered)  morphine 4 MG/ML injection 4 mg (4 mg Intravenous Given 11/08/16 1827)     Initial Impression / Assessment and Plan / ED Course  I have reviewed the triage vital signs and the nursing notes.  Pertinent labs & imaging results that were available during my care of the patient were reviewed by me and considered in my medical decision making (see chart for details).   Laboratory tests are reassuring.  I doubt cholecystitis, urinary tract infection, kidney  stone or other acute emergent process.  I suspect his pain is musculoskeletal in nature.  Will discharge home with a prescription for tramadol and Flexeril.  Follow-up with his primary doctor  Final Clinical Impressions(s) / ED Diagnoses   Final diagnoses:  Flank pain    New Prescriptions New  Prescriptions   CYCLOBENZAPRINE (FLEXERIL) 10 MG TABLET    Take 1 tablet (10 mg total) by mouth 2 (two) times daily as needed for muscle spasms.   TRAMADOL (ULTRAM) 50 MG TABLET    Take 1 tablet (50 mg total) by mouth every 6 (six) hours as needed.     Dorie Rank, MD 11/08/16 [phone]

## 2016-11-24 ENCOUNTER — Emergency Department (HOSPITAL_COMMUNITY): Payer: Medicare Other

## 2016-11-24 ENCOUNTER — Encounter (HOSPITAL_COMMUNITY): Payer: Self-pay

## 2016-11-24 ENCOUNTER — Emergency Department (HOSPITAL_COMMUNITY)
Admission: EM | Admit: 2016-11-24 | Discharge: 2016-11-24 | Disposition: A | Discharge status: Home / Self Care | Payer: Medicare Other | Attending: Emergency Medicine | Admitting: Emergency Medicine

## 2016-11-24 DIAGNOSIS — Z7982 Long term (current) use of aspirin: Secondary | ICD-10-CM | POA: Diagnosis not present

## 2016-11-24 DIAGNOSIS — M545 Low back pain: Secondary | ICD-10-CM | POA: Insufficient documentation

## 2016-11-24 DIAGNOSIS — I130 Hypertensive heart and chronic kidney disease with heart failure and stage 1 through stage 4 chronic kidney disease, or unspecified chronic kidney disease: Secondary | ICD-10-CM | POA: Diagnosis not present

## 2016-11-24 DIAGNOSIS — E1122 Type 2 diabetes mellitus with diabetic chronic kidney disease: Secondary | ICD-10-CM | POA: Diagnosis not present

## 2016-11-24 DIAGNOSIS — I5022 Chronic systolic (congestive) heart failure: Secondary | ICD-10-CM | POA: Diagnosis not present

## 2016-11-24 DIAGNOSIS — R1031 Right lower quadrant pain: Secondary | ICD-10-CM | POA: Diagnosis not present

## 2016-11-24 DIAGNOSIS — F17210 Nicotine dependence, cigarettes, uncomplicated: Secondary | ICD-10-CM | POA: Diagnosis not present

## 2016-11-24 DIAGNOSIS — N182 Chronic kidney disease, stage 2 (mild): Secondary | ICD-10-CM | POA: Insufficient documentation

## 2016-11-24 DIAGNOSIS — I700 Atherosclerosis of aorta: Secondary | ICD-10-CM | POA: Insufficient documentation

## 2016-11-24 DIAGNOSIS — Z7984 Long term (current) use of oral hypoglycemic drugs: Secondary | ICD-10-CM | POA: Diagnosis not present

## 2016-11-24 DIAGNOSIS — G8929 Other chronic pain: Secondary | ICD-10-CM

## 2016-11-24 DIAGNOSIS — I2510 Atherosclerotic heart disease of native coronary artery without angina pectoris: Secondary | ICD-10-CM | POA: Diagnosis not present

## 2016-11-24 DIAGNOSIS — Z79899 Other long term (current) drug therapy: Secondary | ICD-10-CM | POA: Insufficient documentation

## 2016-11-24 DIAGNOSIS — M25562 Pain in left knee: Secondary | ICD-10-CM | POA: Insufficient documentation

## 2016-11-24 DIAGNOSIS — M549 Dorsalgia, unspecified: Secondary | ICD-10-CM | POA: Diagnosis not present

## 2016-11-24 MED ORDER — METHOCARBAMOL 500 MG PO TABS: 500.0000 mg | ORAL_TABLET | Freq: Three times a day (TID) | ORAL | 0 refills | Status: DC | PRN

## 2016-11-24 MED ORDER — METHOCARBAMOL 500 MG PO TABS
500.0000 mg | ORAL_TABLET | Freq: Once | ORAL | Status: AC
  Administered 2016-11-24: 500 mg via ORAL
  Filled 2016-11-24: qty 1

## 2016-11-24 MED ORDER — ACETAMINOPHEN 325 MG PO TABS
650.0000 mg | ORAL_TABLET | Freq: Once | ORAL | Status: AC
  Administered 2016-11-24: 650 mg via ORAL
  Filled 2016-11-24: qty 2

## 2016-11-24 NOTE — ED Provider Notes (Signed)
[facility] EMERGENCY DEPARTMENT Provider Note   CSN: [id number] Arrival date & time: 11/24/16  8185     History   Chief Complaint Chief Complaint  Patient presents with  . Back Pain    HPI [name] is a 61 y.o. male.  HPI  Pt was seen at 0740. Per pt, c/o gradual onset and persistence of constant acute flair of his chronic low back "pain" for the past several years.  Pt also c/o acute flair of his chronic left knee "pain" for the past several years. Denies any change in his usual chronic pain patterns.  Pain worsens with palpation of the area and body position changes.  States his "[place] doctor isn't doing anything about my pains."  Denies incont/retention of bowel or bladder, no saddle anesthesia, no focal motor weakness, no tingling/numbness in extremities, no fevers, no injury, no abd pain.   The symptoms have been associated with no other complaints. The patient has a significant history of similar symptoms previously, recently being evaluated for this complaint and multiple prior evals for same.    Past Medical History:  Diagnosis Date  . Bulging lumbar disc   . CAD (coronary artery disease) [phone]   a. prior LAD stenting. b. s/p DES to [facility] 08/2015 @ [facility]. c. 04/2016 Cardiac cath at [facility]. # vessel CAD, with patent stent in the PLAD and RCA. Diffuse dLAD, OM2, and  RPDA disease. No aortic stenosis. Elevated LVEDP. No lesion to suggest ACS. Continue medical managment.  . CKD (chronic kidney disease), stage II   . Cocaine abuse ([place])   . DM2 (diabetes mellitus, type 2) ([place])   . HTN (hypertension)   . Hyperlipidemia   . Ischemic cardiomyopathy   . MI, old   . Obesity   . Tobacco use     Patient Active Problem List   Diagnosis Date Noted  . Chest pain 11/03/2016  . Constipation 11/03/2016  . CKD (chronic kidney disease), stage II 08/02/2016  . Cellulitis of right hand 08/02/2016  . Chronic pain 08/02/2016  . AKI (acute kidney injury) ([place])  01/31/2016  . Lobar pneumonia ([place]) 01/31/2016  . Influenza A 01/31/2016  . Diarrhea 01/30/2016  . NSVT (nonsustained ventricular tachycardia) ([place]) 09/12/2015  . Precordial chest pain 09/10/2015  . Cocaine use 09/04/2015  . Near syncope 06/29/2015  . Essential hypertension 06/29/2015  . History of heart artery stents 06/29/2015  . Insulin dependent diabetes mellitus ([place]) 06/29/2015  . History of MI (myocardial infarction) 06/29/2015  . Chronic systolic CHF 02/63/7858  . Musculoskeletal chest pain 05/27/2015  . Rectal bleeding   . Stage 3 chronic renal impairment associated with type 2 diabetes mellitus ([place]) 09/09/2013  . 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 CAD (coronary artery disease) 09/09/2013  . Cardiomyopathy, ischemic-EF 40-45% by echo 09/07/13 09/09/2013  . S/P LAD DES June 2014 09/06/2013  . Tobacco use disorder 09/06/2013  . Hyperglycemia 08/22/2012  . NSTEMI (non-ST elevated myocardial infarction) ([place]) 07/07/2012  . Dyslipidemia 07/07/2012  . Hypertensive heart disease     Past Surgical History:  Procedure Laterality Date  . CARDIAC CATHETERIZATION N/A 09/07/2015   Procedure: Left Heart Cath and Coronary Angiography;  Surgeon: Leonie Man, MD;  Location: [place] CV LAB;  Service: Cardiovascular;  Laterality: N/A;  . CARDIAC CATHETERIZATION N/A 09/07/2015   Procedure: Coronary Stent Intervention;  Surgeon: Leonie Man, MD;  Location: [place] CV LAB;  Service: Cardiovascular;  Laterality: N/A;  .  CORONARY ANGIOGRAM  09/07/13   residual RCA and OM disease  . CORONARY ANGIOPLASTY WITH STENT PLACEMENT    . LEFT HEART CATH Bilateral 07/08/2012   Procedure: LEFT HEART CATH;  Surgeon: [name], MD;  Location: [facility] CATH LAB;  Service: Cardiovascular;  Laterality: Bilateral;  . LEFT HEART CATHETERIZATION WITH CORONARY ANGIOGRAM N/A 09/06/2013   STEMI, 2nd ISR LAD. Procedure: LEFT HEART CATHETERIZATION WITH CORONARY ANGIOGRAM;  Surgeon:  [name], MD;  Location: [facility] CATH LAB;  Service: Cardiovascular;  Laterality: N/A;  . PERCUTANEOUS CORONARY STENT INTERVENTION (PCI-S)  07/08/2012   Procedure: PERCUTANEOUS CORONARY STENT INTERVENTION (PCI-S);  Surgeon: [name], MD;  Location: [facility] CATH LAB;  Service: Cardiovascular;;  DES LAD  . PERCUTANEOUS CORONARY STENT INTERVENTION (PCI-S) N/A 09/06/2013   Procedure: PERCUTANEOUS CORONARY STENT INTERVENTION (PCI-S);  Surgeon: [name], MD;  Location: [facility] CATH LAB;  Service: Cardiovascular;  Laterality: N/A;  Mid LAD 3.0/24mm Promus       Home Medications    Prior to Admission medications   Medication Sig Start Date End Date Taking? Authorizing Provider  aspirin EC 81 MG tablet Take 81 mg by mouth daily.     [provider]  Continuous Blood Gluc Receiver (FREESTYLE LIBRE READER) DEVI 1 packet by Does not apply route daily. 08/04/16   Rexene Alberts, MD  Continuous Blood Gluc Sensor (Echo) MISC 1 Piece by Does not apply route continuous. 08/04/16   Rexene Alberts, MD  cyclobenzaprine (FLEXERIL) 10 MG tablet Take 1 tablet (10 mg total) by mouth 2 (two) times daily as needed for muscle spasms. 11/08/16   Dorie Rank, MD  docusate sodium (COLACE) 100 MG capsule Take 100 mg by mouth daily as needed for mild constipation.     [provider]  metFORMIN (GLUCOPHAGE) 1000 MG tablet Take 1 tablet (1,000 mg total) by mouth 2 (two) times daily with a meal. Do not restart Metformin until Wednesday am 7/2 07/10/12   [name], MD  nitroGLYCERIN (NITROSTAT) 0.4 MG SL tablet Place 1 tablet (0.4 mg total) under the tongue every 5 (five) minutes x 3 doses as needed for chest pain. 05/03/14   Thurnell Lose, MD  ranolazine (RANEXA) 500 MG 12 hr tablet Take 1 tablet (500 mg total) by mouth 2 (two) times daily. Patient not taking: Reported on 11/08/2016 11/04/16 12/04/16  Murlean Iba, MD  traMADol (ULTRAM) 50 MG tablet Take 1  tablet (50 mg total) by mouth every 6 (six) hours as needed. 11/08/16   Dorie Rank, MD    Family History Family History  Problem Relation Age of Onset  . Hypertension Mother   . Diabetes Mother   . Hypertension Father   . Diabetes Father   . Hypertension Sister   . Diabetes Sister     Social History Social History   Tobacco Use  . Smoking status: Current Every Day Smoker    Packs/day: 0.50    Types: Cigarettes  . Smokeless tobacco: Never Used  Substance Use Topics  . Alcohol use: Yes    Comment: occ  . Drug use: No    Comment: stopped 1 year ago as of 10/25/16     Allergies   Patient has no known allergies.   Review of Systems Review of Systems ROS: Statement: All systems negative except as marked or noted in the HPI; Constitutional: Negative for fever and chills. ; ; Eyes: Negative for eye pain, redness and discharge. ; ;  ENMT: Negative for ear pain, hoarseness, nasal congestion, sinus pressure and sore throat. ; ; Cardiovascular: Negative for chest pain, palpitations, diaphoresis, dyspnea and peripheral edema. ; ; Respiratory: Negative for cough, wheezing and stridor. ; ; Gastrointestinal: Negative for nausea, vomiting, diarrhea, abdominal pain, blood in stool, hematemesis, jaundice and rectal bleeding. . ; ; Genitourinary: Negative for dysuria, flank pain and hematuria. ; ; Musculoskeletal: +chronic LBP, chronic left knee pain. Negative for neck pain. Negative for swelling and trauma.; ; Skin: Negative for pruritus, rash, abrasions, blisters, bruising and skin lesion.; ; Neuro: Negative for headache, lightheadedness and neck stiffness. Negative for weakness, altered level of consciousness, altered mental status, extremity weakness, paresthesias, involuntary movement, seizure and syncope.       Physical Exam Updated Vital Signs BP (!) 132/92 (BP Location: Left Arm)   Pulse 88   Temp 97.7 F (36.5 C) (Oral)   Resp 18   Ht 5\' 9"  (1.753 m)   Wt 105.2 kg (232 lb)    SpO2 100%   BMI 34.26 kg/m   Physical Exam 0745: Physical examination:  Nursing notes reviewed; Vital signs and O2 SAT reviewed;  Constitutional: Well developed, Well nourished, Well hydrated, In no acute distress; Head:  Normocephalic, atraumatic; Eyes: EOMI, PERRL, No scleral icterus; ENMT: Mouth and pharynx normal, Mucous membranes moist; Neck: Supple, Full range of motion, No lymphadenopathy; Cardiovascular: Regular rate and rhythm, No gallop; Respiratory: Breath sounds clear & equal bilaterally, No wheezes.  Speaking full sentences with ease, Normal respiratory effort/excursion; Chest: Nontender, Movement normal; Abdomen: Soft, Nontender, Nondistended, Normal bowel sounds; Genitourinary: No CVA tenderness; Spine:  No midline CS, TS, LS tenderness. +TTP right lumbar paraspinal muscles.;;  Extremities: Pulses normal,  +FROM left knee, including able to lift extended LLE off stretcher, and extend left lower leg against resistance.  No ligamentous laxity.  No patellar or quad tendon step-offs.  NMS intact left foot, strong pedal pp. +plantarflexion of left foot w/calf squeeze.  No palpable gap left Achilles's tendon.  No proximal fibular head tenderness.  No edema, erythema, warmth, ecchymosis or deformity.  No specific area of point tenderness.  No edema, No calf tenderness, edema or asymmetry.; Neuro: AA&Ox3, Major CN grossly intact.  Speech clear. No gross focal motor or sensory deficits in extremities. Climbs on and off stretcher easily by himself. Gait steady.; Skin: Color normal, Warm, Dry.   ED Treatments / Results  Labs (all labs ordered are listed, but only abnormal results are displayed)   EKG  EKG Interpretation None       Radiology   Procedures Procedures (including critical care time)  Medications Ordered in ED Medications  acetaminophen (TYLENOL) tablet 650 mg (not administered)  methocarbamol (ROBAXIN) tablet 500 mg (not administered)     Initial Impression /  Assessment and Plan / ED Course  I have reviewed the triage vital signs and the nursing notes.  Pertinent labs & imaging results that were available during my care of the patient were reviewed by me and considered in my medical decision making (see chart for details).  MDM Reviewed: previous chart, nursing note and vitals Interpretation: x-ray and CT scan     Dg Knee Complete 4 Views Left Result Date: 11/24/2016 CLINICAL DATA:  Chronic left knee pain. EXAM: LEFT KNEE - COMPLETE 4+ VIEW COMPARISON:  None. FINDINGS: Generalized osteopenia. No acute fracture or dislocation. Prior ACL repair. Mild tricompartmental osteoarthritis of the left knee. No significant joint effusion. No aggressive osseous lesion. Peripheral vascular atherosclerotic disease. No other  soft tissue abnormality. IMPRESSION: 1.  No acute osseous injury of the left knee. 2. Mild tricompartmental osteoarthritis of the left knee. Electronically Signed   By: Kathreen Devoid   On: 11/24/2016 08:54   Ct Renal Stone Study Result Date: 11/24/2016 CLINICAL DATA:  Chronic back pain, mechanical. EXAM: CT ABDOMEN AND PELVIS WITHOUT CONTRAST TECHNIQUE: Multidetector CT imaging of the abdomen and pelvis was performed following the standard protocol without IV contrast. COMPARISON:  05/01/2014 FINDINGS: Lower chest:  RCA atherosclerotic calcification. Hepatobiliary: No focal liver abnormality.No evidence of biliary obstruction or stone. Pancreas: Unremarkable. Spleen: Unremarkable. Adrenals/Urinary Tract: Negative adrenals. No urolithiasis when accounting for atherosclerotic calcification. No hydronephrosis. Unremarkable bladder. Stomach/Bowel:  No obstruction. No appendicitis. Vascular/Lymphatic: Atherosclerosis of the aorta and iliacs. Chronic aneurysmal enlargement of the bilateral common iliac arteries, greater on the right at 2.4 cm. No mass or adenopathy. Reproductive:No pathologic findings. Other: No ascites or pneumoperitoneum.  Musculoskeletal: Congenitally narrow lumbar spinal canal. The thecal sac is further effaced by epidural lipomatosis. Advanced facet arthropathy at L3-4 with spurring. Advanced L4-5 disc degeneration with disc narrowing, bulging, and endplate ridging. biforaminal L4-5 impingement. Transitional L5 vertebra based on the lowest ribs. IMPRESSION: 1. No hydronephrosis or urolithiasis. 2. 3. Aortic Atherosclerosis (ICD10-I70.0). Coronary atherosclerosis. Common iliac artery aneurysms measuring up to 2.4 cm on the right, stable from 2016. 4. History of chronic mechanical back pain. No acute osseous finding or change from prior. 5. Transitional L5 vertebra. 6. Lower thecal sac effacement from congenitally narrow canal, degenerative change and epidural lipomatosis. 7. Biforaminal L4-5 impingement. 8. Advanced L3-4 facet arthropathy. Electronically Signed   By: Monte Fantasia M.D.   On: 11/24/2016 08:49     0750:  Pt states he is "out of pain medicine." When asked what he usually takes for pain, he stated "whatever you all give me."  [place] Controlled Substance Database accessed: pt has had 3 narcotic rx in the past 1 month, written by 3 different providers. Pt made aware that I will treat his pain, but not with narcotic pain medications, as the ED is not the venue to receive chronic narcotic medication, and he will need to see his PMD for narcotic prescriptions. Offered APAP and robaxin. Pt agitated.   1610:  Pt continuing to demand "stronger" pain medications. XR/CT reassuring. Pt strongly encouraged to f/u with PMD for good continuity of care and control of his chronic pain. Dx and testing d/w pt.  Questions answered.  Verb understanding, agreeable to d/c home with outpt f/u.   Final Clinical Impressions(s) / ED Diagnoses   Final diagnoses:  None    ED Discharge Orders    None       Francine Graven, DO 11/28/16 2039

## 2016-11-24 NOTE — Discharge Instructions (Signed)
Take the prescription as directed. Also take over the counter tylenol, as directed on packaging, as needed for discomfort. Apply moist heat or ice to the area(s) of discomfort, for 15 minutes at a time, several times per day for the next few days.  Do not fall asleep on a heating or ice pack.  Call your regular medical doctor today to schedule a follow up appointment this week.  Return to the Emergency Department immediately if worsening.

## 2016-11-24 NOTE — ED Triage Notes (Signed)
Pt reports pain in r side of abd, left lower back, and left leg for " a while."  Pt says he is out of his medication.  Asked pt what he usually takes for this pain and he states," whatever yall give me."

## 2017-02-03 ENCOUNTER — Ambulatory Visit (INDEPENDENT_AMBULATORY_CARE_PROVIDER_SITE_OTHER): Payer: Medicare Other | Admitting: Cardiovascular Disease

## 2017-02-03 ENCOUNTER — Encounter: Payer: Self-pay | Admitting: Cardiovascular Disease

## 2017-02-03 VITALS — BP 112/84 | HR 81 | Ht 69.0 in | Wt 236.0 lb

## 2017-02-03 DIAGNOSIS — I25118 Atherosclerotic heart disease of native coronary artery with other forms of angina pectoris: Secondary | ICD-10-CM

## 2017-02-03 DIAGNOSIS — Z955 Presence of coronary angioplasty implant and graft: Secondary | ICD-10-CM

## 2017-02-03 DIAGNOSIS — E782 Mixed hyperlipidemia: Secondary | ICD-10-CM

## 2017-02-03 DIAGNOSIS — I255 Ischemic cardiomyopathy: Secondary | ICD-10-CM

## 2017-02-03 DIAGNOSIS — I10 Essential (primary) hypertension: Secondary | ICD-10-CM

## 2017-02-03 DIAGNOSIS — Z9114 Patient's other noncompliance with medication regimen: Secondary | ICD-10-CM

## 2017-02-03 MED ORDER — METOPROLOL TARTRATE 25 MG PO TABS: 12.5000 mg | ORAL_TABLET | Freq: Two times a day (BID) | ORAL | 3 refills | Status: DC

## 2017-02-03 MED ORDER — ROSUVASTATIN CALCIUM 5 MG PO TABS: 5.0000 mg | ORAL_TABLET | Freq: Every day | ORAL | 3 refills | Status: DC

## 2017-02-03 NOTE — Patient Instructions (Signed)
Your physician wants you to follow-up in:  6 months with [place] PA-C You will receive a reminder letter in the mail two months in advance. If you don't receive a letter, please call our office to schedule the follow-up appointment.      START Crestor 5 mg daily at dinner   START Lopressor 12.5 mg twice a day      No lab work or testing ordered today.      Thank you for choosing [facility] !

## 2017-02-03 NOTE — Progress Notes (Signed)
SUBJECTIVE: The patient presents for follow-up of coronary artery disease.  He has a history of multivessel coronary artery disease. He has a history of prior LAD stent placement and underwent mid RCA drug-eluting stent placement on 09/07/15.  He is noncompliant with medical therapy.  He was hospitalized in October 2018 for chest pain at which time I consulted on him.  At that time, he stopped all medications but aspirin and metformin on his own.  His chest pain was alleviated with a GI cocktail and belching.  Echocardiogram 09/05/15: Mildly reduced left ventricular systolic function, LVEF 24-09%, hypokinesis of the apical, inferoapical, and anteroseptal walls. There was mild mitral regurgitation and moderate left atrial dilatation.  He apparently underwent repeat coronary angiography at the [facility] in [place], [place] on 04/15/16 which showed patent stents with diffuse CAD and recommended medical management.  He has a history of cocaine and crack abuse.  He has again stopped taking all of his cardiac medications other than a baby aspirin daily.  He said the medications made him sick.  He failed to notify anyone.  He said he has a new health plan and is being established with a new PCP.  He has recently had a few episodes of chest pain which resolved on its own.  He describes his left thigh being "sore to the touch ".  He denies orthopnea, palpitations, and leg swelling.    Review of Systems: As per "subjective", otherwise negative.  No Known Allergies  Current Outpatient Medications  Medication Sig Dispense Refill  . aspirin EC 81 MG tablet Take 81 mg by mouth daily.     . Continuous Blood Gluc Receiver (FREESTYLE LIBRE READER) DEVI 1 packet by Does not apply route daily. 1 Device 6  . Continuous Blood Gluc Sensor (FREESTYLE LIBRE SENSOR SYSTEM) MISC 1 Piece by Does not apply route continuous. 3 each 6  . metFORMIN (GLUCOPHAGE) 1000 MG tablet Take 1 tablet (1,000 mg total) by mouth  2 (two) times daily with a meal. Do not restart Metformin until Wednesday am 7/2    . nitroGLYCERIN (NITROSTAT) 0.4 MG SL tablet Place 1 tablet (0.4 mg total) under the tongue every 5 (five) minutes x 3 doses as needed for chest pain. 25 tablet 12  . cyclobenzaprine (FLEXERIL) 10 MG tablet Take 1 tablet (10 mg total) by mouth 2 (two) times daily as needed for muscle spasms. (Patient not taking: Reported on 02/03/2017) 20 tablet 0  . docusate sodium (COLACE) 100 MG capsule Take 100 mg by mouth daily as needed for mild constipation.      No current facility-administered medications for this visit.     Past Medical History:  Diagnosis Date  . Bulging lumbar disc   . CAD (coronary artery disease) [phone]   a. prior LAD stenting. b. s/p DES to [facility] 08/2015 @ [facility]. c. 04/2016 Cardiac cath at [facility]. # vessel CAD, with patent stent in the PLAD and RCA. Diffuse dLAD, OM2, and  RPDA disease. No aortic stenosis. Elevated LVEDP. No lesion to suggest ACS. Continue medical managment.  . CKD (chronic kidney disease), stage II   . Cocaine abuse ([place])   . DM2 (diabetes mellitus, type 2) ([place])   . HTN (hypertension)   . Hyperlipidemia   . Ischemic cardiomyopathy   . MI, old   . Obesity   . Tobacco use     Past Surgical History:  Procedure Laterality Date  . CARDIAC CATHETERIZATION N/A 09/07/2015  Procedure: Left Heart Cath and Coronary Angiography;  Surgeon: Leonie Man, MD;  Location: [place] CV LAB;  Service: Cardiovascular;  Laterality: N/A;  . CARDIAC CATHETERIZATION N/A 09/07/2015   Procedure: Coronary Stent Intervention;  Surgeon: Leonie Man, MD;  Location: [place] CV LAB;  Service: Cardiovascular;  Laterality: N/A;  . CORONARY ANGIOGRAM  09/07/13   residual RCA and OM disease  . CORONARY ANGIOPLASTY WITH STENT PLACEMENT    . LEFT HEART CATH Bilateral 07/08/2012   Procedure: LEFT HEART CATH;  Surgeon: [name], MD;  Location: [facility] CATH LAB;  Service:  Cardiovascular;  Laterality: Bilateral;  . LEFT HEART CATHETERIZATION WITH CORONARY ANGIOGRAM N/A 09/06/2013   STEMI, 2nd ISR LAD. Procedure: LEFT HEART CATHETERIZATION WITH CORONARY ANGIOGRAM;  Surgeon: [name], MD;  Location: [facility] CATH LAB;  Service: Cardiovascular;  Laterality: N/A;  . PERCUTANEOUS CORONARY STENT INTERVENTION (PCI-S)  07/08/2012   Procedure: PERCUTANEOUS CORONARY STENT INTERVENTION (PCI-S);  Surgeon: [name], MD;  Location: [facility] CATH LAB;  Service: Cardiovascular;;  DES LAD  . PERCUTANEOUS CORONARY STENT INTERVENTION (PCI-S) N/A 09/06/2013   Procedure: PERCUTANEOUS CORONARY STENT INTERVENTION (PCI-S);  Surgeon: [name], MD;  Location: [facility] CATH LAB;  Service: Cardiovascular;  Laterality: N/A;  Mid LAD 3.0/24mm Promus    Social History   Socioeconomic History  . Marital status: Divorced    Spouse name: Not on file  . Number of children: Not on file  . Years of education: Not on file  . Highest education level: Not on file  Social Needs  . Financial resource strain: Not on file  . Food insecurity - worry: Not on file  . Food insecurity - inability: Not on file  . Transportation needs - medical: Not on file  . Transportation needs - non-medical: Not on file  Occupational History  . Not on file  Tobacco Use  . Smoking status: Current Every Day Smoker    Packs/day: 0.50    Types: Cigarettes  . Smokeless tobacco: Never Used  Substance and Sexual Activity  . Alcohol use: Yes    Comment: occ  . Drug use: No    Comment: stopped 1 year ago as of 10/25/16  . Sexual activity: Yes  Other Topics Concern  . Not on file  Social History Narrative   ** Merged History Encounter **         Vitals:   02/03/17 0845  BP: 112/84  Pulse: 81  SpO2: 98%  Weight: 236 lb (107 kg)  Height: 5\' 9"  (1.753 m)    Wt Readings from Last 3 Encounters:  02/03/17 236 lb (107 kg)  11/24/16 232 lb (105.2 kg)  11/03/16 232 lb 5.8 oz (105.4 kg)      PHYSICAL EXAM General: NAD HEENT: Normal. Neck: No JVD, no thyromegaly. Lungs: Diminished throughout, no crackles or wheezes. CV: Regular rate and rhythm, normal S1/S2, no S3/S4, no murmur. No pretibial or periankle edema.  No carotid bruit.   Abdomen: Soft, nontender, no distention.  Neurologic: Alert and oriented.  Psych: Normal affect. Skin: Normal. Musculoskeletal: No gross deformities.    ECG: Most recent ECG reviewed.   Labs: Lab Results  Component Value Date/Time   K 4.1 11/08/2016 06:27 PM   BUN 18 11/08/2016 06:27 PM   CREATININE 1.25 (H) 11/08/2016 06:27 PM   ALT 13 (L) 11/08/2016 06:27 PM   TSH 4.880 (H) 07/14/2012 11:15 PM   HGB 15.0 11/08/2016 06:27 PM     Lipids:  Lab Results  Component Value Date/Time   LDLCALC 101 (H) 09/11/2015 12:52 AM   CHOL 161 09/11/2015 12:52 AM   TRIG 174 (H) 09/11/2015 12:52 AM   HDL 25 (L) 09/11/2015 12:52 AM       ASSESSMENT AND PLAN:  1. Multivessel coronary artery disease with stents in the LAD and RCA: He is now only taking aspirin and stopped Toprol-XL, Lipitor, and Plavix.  I had another discussion with him about informing me should he decide to stop taking his medications.  I will try low-dose metoprolol tartrate 12.5 mg twice daily and low-dose Crestor 5 mg daily.  2. Ischemic cardiomyopathy: No signs of heart failure. Does not require a diuretic at this time.  3. Hyperlipidemia: As stated above, he stopped taking Lipitor on his own.  He is uncertain which medications make him feel sick.  I will try low-dose Crestor 5 mg.  4.  Noncompliance with medication regimen:  I had another discussion with him about informing me should he decide to stop taking his medications.  I alerted him to the deleterious side effects from medication noncompliance.  If this is not my first time doing so.    Disposition: Follow up 6 months   [name], M.D., F.A.C.C.

## 2017-08-29 ENCOUNTER — Encounter: Payer: Self-pay | Admitting: Sports Medicine

## 2017-08-29 ENCOUNTER — Ambulatory Visit (INDEPENDENT_AMBULATORY_CARE_PROVIDER_SITE_OTHER): Payer: Medicare Other | Admitting: Sports Medicine

## 2017-08-29 ENCOUNTER — Telehealth: Payer: Self-pay | Admitting: *Deleted

## 2017-08-29 VITALS — BP 121/82 | HR 79 | Resp 16

## 2017-08-29 DIAGNOSIS — M79676 Pain in unspecified toe(s): Secondary | ICD-10-CM

## 2017-08-29 DIAGNOSIS — I739 Peripheral vascular disease, unspecified: Secondary | ICD-10-CM | POA: Diagnosis not present

## 2017-08-29 DIAGNOSIS — E119 Type 2 diabetes mellitus without complications: Secondary | ICD-10-CM | POA: Diagnosis not present

## 2017-08-29 DIAGNOSIS — B351 Tinea unguium: Secondary | ICD-10-CM | POA: Diagnosis not present

## 2017-08-29 DIAGNOSIS — L600 Ingrowing nail: Secondary | ICD-10-CM

## 2017-08-29 NOTE — Progress Notes (Signed)
Subjective: [name] is a 62 y.o. male patient with history of diabetes who presents to office today complaining of long,mildly painful nails while ambulating in shoes; unable to trim especially left 1st toe, PCP gave antibiotics, reports throbbing pain to toe. Patient states that the glucose reading this morning was not recorded A1c 13.. Patient denies any new changes in medication or new problems. Patient denies any new cramping, numbness, burning or tingling in the legs.  Patient is from [place], [place].  Review of Systems  Musculoskeletal:       Left toe pain  All other systems reviewed and are negative.    Patient Active Problem List   Diagnosis Date Noted  . Chest pain 11/03/2016  . Constipation 11/03/2016  . CKD (chronic kidney disease), stage II 08/02/2016  . Cellulitis of right hand 08/02/2016  . Chronic pain 08/02/2016  . AKI (acute kidney injury) ([place]) 01/31/2016  . Lobar pneumonia ([place]) 01/31/2016  . Influenza A 01/31/2016  . Diarrhea 01/30/2016  . NSVT (nonsustained ventricular tachycardia) ([place]) 09/12/2015  . Precordial chest pain 09/10/2015  . Cocaine use 09/04/2015  . Near syncope 06/29/2015  . Essential hypertension 06/29/2015  . History of heart artery stents 06/29/2015  . Insulin dependent diabetes mellitus ([place]) 06/29/2015  . History of MI (myocardial infarction) 06/29/2015  . Chronic systolic CHF 09/32/3557  . Musculoskeletal chest pain 05/27/2015  . OSA on CPAP 01/21/2015  . Rectal bleeding   . Stage 3 chronic renal impairment associated with type 2 diabetes mellitus ([place]) 09/09/2013  . 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 CAD (coronary artery disease) 09/09/2013  . Cardiomyopathy, ischemic-EF 40-45% by echo 09/07/13 09/09/2013  . S/P LAD DES June 2014 09/06/2013  . Tobacco use disorder 09/06/2013  . Hyperglycemia 08/22/2012  . NSTEMI (non-ST elevated myocardial infarction) ([place]) 07/07/2012  . Dyslipidemia 07/07/2012  .  Hypertensive heart disease    Current Outpatient Medications on File Prior to Visit  Medication Sig Dispense Refill  . aspirin EC 81 MG tablet Take 81 mg by mouth daily as needed.     . Continuous Blood Gluc Receiver (FREESTYLE LIBRE READER) DEVI 1 packet by Does not apply route daily. 1 Device 6  . Continuous Blood Gluc Sensor (FREESTYLE LIBRE SENSOR SYSTEM) MISC 1 Piece by Does not apply route continuous. 3 each 6  . cyclobenzaprine (FLEXERIL) 10 MG tablet Take 1 tablet (10 mg total) by mouth 2 (two) times daily as needed for muscle spasms. 20 tablet 0  . docusate sodium (COLACE) 100 MG capsule Take 100 mg by mouth daily as needed for mild constipation.     . empagliflozin (JARDIANCE) 25 MG TABS tablet Take 25 mg by mouth daily.    . insulin glargine (LANTUS) 100 UNIT/ML injection Inject into the skin daily.    . metFORMIN (GLUCOPHAGE) 1000 MG tablet Take 1 tablet (1,000 mg total) by mouth 2 (two) times daily with a meal. Do not restart Metformin until Wednesday am 7/2    . nitroGLYCERIN (NITROSTAT) 0.4 MG SL tablet Place 1 tablet (0.4 mg total) under the tongue every 5 (five) minutes x 3 doses as needed for chest pain. 25 tablet 12  . metoprolol tartrate (LOPRESSOR) 25 MG tablet Take 0.5 tablets (12.5 mg total) by mouth 2 (two) times daily. 90 tablet 3  . rosuvastatin (CRESTOR) 5 MG tablet Take 1 tablet (5 mg total) by mouth daily. 90 tablet 3   No current facility-administered medications on file prior to visit.  No Known Allergies  No results found for this or any previous visit (from the past 2160 hour(s)).  Objective: General: Patient is awake, alert, and oriented x 3 and in no acute distress.  Integument: Skin is warm, dry and supple bilateral. Nails are tender, long, thickened and dystrophic with subungual debris, consistent with onychomycosis, 1-5 bilateral with mild ingrowing and purple discoloration to left 1st toe. No other signs of infection. No open lesions or preulcerative  lesions present bilateral. Remaining integument unremarkable.  Vasculature:  Dorsalis Pedis pulse 1/4 bilateral. Posterior Tibial pulse  0/4 bilateral. Capillary fill time <5 sec 1-5 bilateral. Scant hair growth to the level of the digits.Temperature gradient within normal limits. No varicosities present bilateral. Trace edema present bilateral.   Neurology: The patient has intact sensation measured with a 5.07/10g Semmes Weinstein Monofilament at all pedal sites bilateral . Vibratory sensation diminished bilateral with tuning fork. No Babinski sign present bilateral.   Musculoskeletal: Asymptomatic pes planus pedal deformities noted bilateral. Muscular strength 5/5 in all lower extremity muscular groups bilateral without pain on range of motion. No tenderness with calf compression bilateral.  Assessment and Plan: Problem List Items Addressed This Visit    None    Visit Diagnoses    Pain due to onychomycosis of toenail    -  Primary   Ingrown nail       PAD (peripheral artery disease) ([place])       Relevant Orders   POCT ABI Screening for [place] No Charge   Diabetes mellitus without complication (HCC)       A1C 13   Relevant Medications   empagliflozin (JARDIANCE) 25 MG TABS tablet   insulin glargine (LANTUS) 100 UNIT/ML injection      -Examined patient. -Discussed and educated patient on diabetic foot care, especially with  regards to the vascular, neurological and musculoskeletal systems.  -Stressed the importance of good glycemic control and the detriment of not  controlling glucose levels in relation to the foot. -Mechanically debrided all nails 1-5 bilateral using sterile nail nipper and filed with dremel without incident  -ABI in office revealed PAD; Referal placed especially since purple discoloration is at left 1st toe with pain -Answered all patient questions -Patient to return after ABIs and  in 3 months for at risk foot care -Patient advised to call the office if any  problems or questions arise in the meantime.  Landis Martins, DPM

## 2017-08-29 NOTE — Telephone Encounter (Signed)
Pt is established with Dr. [name] - [facility]. Faxed referral, clinicals and demographics to [facility].

## 2017-08-29 NOTE — Telephone Encounter (Signed)
Great.  Thanks

## 2017-08-29 NOTE — Telephone Encounter (Signed)
-----   Message from Landis Martins, [place] sent at 08/29/2017 10:55 AM EDT ----- Regarding: Vascular follow up  ABI in office showed PAD

## 2017-09-29 ENCOUNTER — Encounter: Payer: Self-pay | Admitting: Student

## 2017-09-29 ENCOUNTER — Ambulatory Visit (INDEPENDENT_AMBULATORY_CARE_PROVIDER_SITE_OTHER): Payer: Medicare Other | Admitting: Student

## 2017-09-29 VITALS — BP 132/88 | HR 77 | Ht 69.0 in | Wt 233.0 lb

## 2017-09-29 DIAGNOSIS — I255 Ischemic cardiomyopathy: Secondary | ICD-10-CM | POA: Diagnosis not present

## 2017-09-29 DIAGNOSIS — E782 Mixed hyperlipidemia: Secondary | ICD-10-CM

## 2017-09-29 DIAGNOSIS — IMO0001 Reserved for inherently not codable concepts without codable children: Secondary | ICD-10-CM

## 2017-09-29 DIAGNOSIS — I10 Essential (primary) hypertension: Secondary | ICD-10-CM

## 2017-09-29 DIAGNOSIS — I739 Peripheral vascular disease, unspecified: Secondary | ICD-10-CM

## 2017-09-29 DIAGNOSIS — I2510 Atherosclerotic heart disease of native coronary artery without angina pectoris: Secondary | ICD-10-CM | POA: Diagnosis not present

## 2017-09-29 DIAGNOSIS — Z794 Long term (current) use of insulin: Secondary | ICD-10-CM

## 2017-09-29 DIAGNOSIS — E119 Type 2 diabetes mellitus without complications: Secondary | ICD-10-CM

## 2017-09-29 MED ORDER — ROSUVASTATIN CALCIUM 5 MG PO TABS: 5.0000 mg | ORAL_TABLET | ORAL | 12 refills | Status: DC

## 2017-09-29 NOTE — Progress Notes (Signed)
Cardiology Office Note    Date:  09/29/2017   ID:  Russell, [name] [birth year], MRN [id number]  PCP:  Patient, No Pcp Per  Cardiologist: [name], MD    Chief Complaint  Patient presents with  . Follow-up    Overdue 6 month visit    History of Present Illness:    [name] is a 62 y.o. male with past medical history of CAD (s/p DES to RCA in 08/2015, patent stent by repeat cardiac catheterization in 04/2016 with diffuse nonobstructive CAD along distal-LAD, OM 2, and PDA), ischemic cardiomyopathy, HTN, HLD, IDDM, history of cocaine use, and medication noncompliance who presents to the office today for overdue six-month follow-up.  He was last examined by Dr. [name] in 01/2017 and reported at that time having stopped all of his cardiac medications except ASA. He did report occasional episodes of chest discomfort which had spontaneously resolved and denied any changes in his respiratory status. He was restarted on Lopressor 12.5 mg twice daily and Crestor 5 mg daily at the time of his visit.  In talking with the patient today, he reports overall doing well from a cardiac perspective since his last office visit. He denies any recent episodes of chest pain or dyspnea on exertion. No recent orthopnea, PND, lower extremity edema, or palpitations. He did self-discontinue Lopressor and Crestor following his last visit as he says he "felt awful" on the medications but is unable to recall specific symptoms.  He does report having pain along his left foot which has been occurring for the past several months. He was recently evaluated by Podiatry and informed he had issues with poor circulation.   Past Medical History:  Diagnosis Date  . Bulging lumbar disc   . CAD (coronary artery disease) [id number]   a. prior LAD stenting. b. s/p DES to [facility] 08/2015 @ [facility]. c. 04/2016 Cardiac cath at [facility] # vessel CAD, with patent stent in the PLAD and RCA. Diffuse dLAD, OM2,  and  RPDA disease. No aortic stenosis. Elevated LVEDP. No lesion to suggest ACS. Continue medical managment.  . CKD (chronic kidney disease), stage II   . Cocaine abuse ([place])   . DM2 (diabetes mellitus, type 2) ([place])   . HTN (hypertension)   . Hyperlipidemia   . Ischemic cardiomyopathy   . MI, old   . Obesity   . Tobacco use     Past Surgical History:  Procedure Laterality Date  . CARDIAC CATHETERIZATION N/A 09/07/2015   Procedure: Left Heart Cath and Coronary Angiography;  Surgeon: Leonie Man, MD;  Location: [place] CV LAB;  Service: Cardiovascular;  Laterality: N/A;  . CARDIAC CATHETERIZATION N/A 09/07/2015   Procedure: Coronary Stent Intervention;  Surgeon: Leonie Man, MD;  Location: [place] CV LAB;  Service: Cardiovascular;  Laterality: N/A;  . CORONARY ANGIOGRAM  09/07/13   residual RCA and OM disease  . CORONARY ANGIOPLASTY WITH STENT PLACEMENT    . LEFT HEART CATH Bilateral 07/08/2012   Procedure: LEFT HEART CATH;  Surgeon: [name], MD;  Location: [facility] CATH LAB;  Service: Cardiovascular;  Laterality: Bilateral;  . LEFT HEART CATHETERIZATION WITH CORONARY ANGIOGRAM N/A 09/06/2013   STEMI, 2nd ISR LAD. Procedure: LEFT HEART CATHETERIZATION WITH CORONARY ANGIOGRAM;  Surgeon: [name], MD;  Location: [facility] CATH LAB;  Service: Cardiovascular;  Laterality: N/A;  . PERCUTANEOUS CORONARY STENT INTERVENTION (PCI-S)  07/08/2012   Procedure: PERCUTANEOUS CORONARY STENT INTERVENTION (PCI-S);  Surgeon: Charlann Lange  Irish Lack, MD;  Location: [facility] CATH LAB;  Service: Cardiovascular;;  DES LAD  . PERCUTANEOUS CORONARY STENT INTERVENTION (PCI-S) N/A 09/06/2013   Procedure: PERCUTANEOUS CORONARY STENT INTERVENTION (PCI-S);  Surgeon: [name], MD;  Location: [facility] CATH LAB;  Service: Cardiovascular;  Laterality: N/A;  Mid LAD 3.0/24mm Promus    Current Medications: Outpatient Medications Prior to Visit  Medication Sig Dispense Refill  . aspirin EC 81 MG tablet  Take 81 mg by mouth daily as needed.     . Continuous Blood Gluc Receiver (FREESTYLE LIBRE READER) DEVI 1 packet by Does not apply route daily. 1 Device 6  . Continuous Blood Gluc Sensor (FREESTYLE LIBRE SENSOR SYSTEM) MISC 1 Piece by Does not apply route continuous. 3 each 6  . empagliflozin (JARDIANCE) 25 MG TABS tablet Take 25 mg by mouth daily.    . insulin glargine (LANTUS) 100 UNIT/ML injection Inject into the skin daily.    . metFORMIN (GLUCOPHAGE) 1000 MG tablet Take 1 tablet (1,000 mg total) by mouth 2 (two) times daily with a meal. Do not restart Metformin until Wednesday am 7/2    . nitroGLYCERIN (NITROSTAT) 0.4 MG SL tablet Place 1 tablet (0.4 mg total) under the tongue every 5 (five) minutes x 3 doses as needed for chest pain. 25 tablet 12  . cyclobenzaprine (FLEXERIL) 10 MG tablet Take 1 tablet (10 mg total) by mouth 2 (two) times daily as needed for muscle spasms. 20 tablet 0  . docusate sodium (COLACE) 100 MG capsule Take 100 mg by mouth daily as needed for mild constipation.     . metoprolol tartrate (LOPRESSOR) 25 MG tablet Take 0.5 tablets (12.5 mg total) by mouth 2 (two) times daily. 90 tablet 3  . rosuvastatin (CRESTOR) 5 MG tablet Take 1 tablet (5 mg total) by mouth daily. 90 tablet 3   No facility-administered medications prior to visit.      Allergies:   Patient has no known allergies.   Social History   Socioeconomic History  . Marital status: Divorced    Spouse name: Not on file  . Number of children: Not on file  . Years of education: Not on file  . Highest education level: Not on file  Occupational History  . Not on file  Social Needs  . Financial resource strain: Not on file  . Food insecurity:    Worry: Not on file    Inability: Not on file  . Transportation needs:    Medical: Not on file    Non-medical: Not on file  Tobacco Use  . Smoking status: Current Every Day Smoker    Packs/day: 0.25    Types: Cigarettes  . Smokeless tobacco: Never Used    Substance and Sexual Activity  . Alcohol use: Not Currently    Comment: occ  . Drug use: No    Types: Cocaine, Marijuana    Comment: stopped 1 year ago as of 10/25/16  . Sexual activity: Yes  Lifestyle  . Physical activity:    Days per week: Not on file    Minutes per session: Not on file  . Stress: Not on file  Relationships  . Social connections:    Talks on phone: Not on file    Gets together: Not on file    Attends religious service: Not on file    Active member of club or organization: Not on file    Attends meetings of clubs or organizations: Not on file    Relationship status: Not  on file  Other Topics Concern  . Not on file  Social History Narrative   ** Merged History Encounter **         Family History:  The patient's family history includes Diabetes in his mother; Hypertension in his mother.   Review of Systems:   Please see the history of present illness.     General:  No chills, fever, night sweats or weight changes.  Cardiovascular:  No chest pain, dyspnea on exertion, edema, orthopnea, palpitations, paroxysmal nocturnal dyspnea. Positive for claudication.  Dermatological: No rash, lesions/masses Respiratory: No cough, dyspnea Urologic: No hematuria, dysuria Abdominal:   No nausea, vomiting, diarrhea, bright red blood per rectum, melena, or hematemesis Neurologic:  No visual changes, wkns, changes in mental status. All other systems reviewed and are otherwise negative except as noted above.   Physical Exam:    VS:  BP 132/88   Pulse 77   Ht 5\' 9"  (1.753 m)   Wt 233 lb (105.7 kg)   SpO2 98%   BMI 34.41 kg/m    General: Well developed, well nourished [place] American male appearing in no acute distress. Head: Normocephalic, atraumatic, sclera non-icteric, no xanthomas, nares are without discharge.  Neck: No carotid bruits. JVD not elevated.  Lungs: Respirations regular and unlabored, without wheezes or rales.  Heart: Regular rate and rhythm. No S3  or S4.  No murmur, no rubs, or gallops appreciated. Abdomen: Soft, non-tender, non-distended with normoactive bowel sounds. No hepatomegaly. No rebound/guarding. No obvious abdominal masses. Msk:  Strength and tone appear normal for age. No joint deformities or effusions. Extremities: No clubbing or cyanosis. No lower extremity edema. Distal pedal pulses are 2+ along RLE, 1+ along LLE. Neuro: Alert and oriented X 3. Moves all extremities spontaneously. No focal deficits noted. Psych:  Responds to questions appropriately with a normal affect. Skin: No rashes or lesions noted  Wt Readings from Last 3 Encounters:  09/29/17 233 lb (105.7 kg)  02/03/17 236 lb (107 kg)  11/24/16 232 lb (105.2 kg)     Studies/Labs Reviewed:   EKG:  EKG is not ordered today.  Recent Labs: 11/03/2016: Magnesium 1.9 11/04/2016: B Natriuretic Peptide 152.0 11/08/2016: ALT 13; BUN 18; Creatinine, Ser 1.25; Hemoglobin 15.0; Platelets 254; Potassium 4.1; Sodium 140   Lipid Panel    Component Value Date/Time   CHOL 161 09/11/2015 0052   TRIG 174 (H) 09/11/2015 0052   HDL 25 (L) 09/11/2015 0052   CHOLHDL 6.4 09/11/2015 0052   VLDL 35 09/11/2015 0052   LDLCALC 101 (H) 09/11/2015 0052    Additional studies/ records that were reviewed today include:   Cardiac Catheterization: 08/2015  Mid RCA lesion, 99 %stenosed. Culprit lesion  A STENT SYNERGY DES 3X28 drug eluting stent was successfully placed. Post intervention, there is a 0% residual stenosis.  Distal RCA at the bifurcation, 80% stenosis.Ost RPDA lesion, 70 %stenosed. - Similar to prior procedure  Ostial LAD ~30% followed by STENT in Prox LAD to Mid LAD Stent - 30 %stenosed. Dist LAD lesion, 65 %stenosed.  Ost 2nd Diag lesion, 80 %stenosed. Jailed by stent  1st Mrg-1 lesion, 60 %stenosed. 1st Mrg-2 lesion, 90 %stenosed. - Similar to prior procedure  There is severe left ventricular systolic dysfunction. The left ventricular ejection fraction is  25-35% by visual estimate. With near akinesis of the apex and severe hypokinesis of the mid anterior and mid inferior walls.  LV end diastolic pressure is severely elevated.  There is mild (2+) mitral regurgitation.  The patient's coronary artery disease has progressed with significant progression in the mid RCA now to 99% stenosis. The distal RCA and PDA lesions are stable. The OM1 lesions are stable.  The LAD stent is patent but with mild in-stent restenosis. There is distal LAD disease.  Successful DES PCI to the mid RCA.  Plan:  Transfer to [facility] post procedure unit for sheath removal with manual pressure.  Restart Effient (could change to Plavix if necessary) - for minimum one year. Continue aspirin  Continue statin, consider increasing dose for discharge.  Relook at echocardiogram, because EF by LV gram was notably lower than echo.  Continue aggressive risk factor modification: Smoking cessation counseling, cocaine abuse counseling, diabetes management, hypertension management and lipid management.  Echocardiogram: 08/2015 Study Conclusions  - Left ventricle: The cavity size was normal. Wall thickness was   increased increased in a pattern of mild to moderate LVH.   Systolic function was mildly reduced. The estimated ejection   fraction was in the range of 45% to 50%. Hypokinesis of the   mid-apicalinferior and apical myocardium. Hypokinesis of the   apicalanteroseptal myocardium. - Mitral valve: There was mild regurgitation. - Left atrium: The atrium was moderately dilated  Assessment:    1. Coronary artery disease involving native coronary artery of native heart without angina pectoris   2. Cardiomyopathy, ischemic   3. PVD (peripheral vascular disease) ([place])   4. Claudication ([place])   5. Essential hypertension   6. Mixed hyperlipidemia   7. IDDM (insulin dependent diabetes mellitus) ([place])      Plan:   In order of problems listed above:  1. CAD - s/p DES to  RCA in 08/2015, patent stent by repeat cardiac catheterization in 04/2016 with diffuse nonobstructive CAD along distal-LAD, OM 2, and PDA. - He denies any recent chest pain or dyspnea on exertion. - Continue with ASA and plan to restart statin therapy as outlined above.  2. Ischemic cardiomyopathy - EF 45 to 50% by echocardiogram in 2017. He denies any recent orthopnea, PND, or lower extremity edema and appears euvolemic by examination today. - He has been intolerant to beta-blocker therapy in the past and is not open to additional medications at this.  No indication for diuretic therapy.  3. PVD/ Claudication - He reports having pain along his left foot with ambulation and was evaluated by Dr. Cannon Kettle (Podiatry) and had abnormal ABI's at the time of his office visit. Will order a formal ABI with Doppler US study to further evaluate.  - continue ASA. Restart statin therapy as outlined below.   4. HTN - BP is at 132/88 during today's visit. He was previously on Lopressor and discontinued this medication. He is not open to trying additional medications at this time. If he becomes more willing to do so in the future, would try to add low-dose ACE-I or ARB given cardiomyopathy and Type 2 DM.   5. HLD - No recent FLP on file. He also reports this is followed by the [place]. He did self discontinue Crestor 5 mg daily due to not tolerating the medication at that time.He is willing to try this again at 5 mg once weekly. I informed him to call our office if he has noted side effects. He will need a repeat FLP and LFT's in 3 months and this can be obtained at our office or by the [place].  6. IDDM - Patient reports hemoglobin A1c was elevated to 13.0 when recently checked by the [place] in  [place]. He has since been trying to follow a low carbohydrate diet and reports compliance with Lantus, Jardiance, and Metformin. Will request these records.   Medication Adjustments/Labs and Tests Ordered: Current medicines are  reviewed at length with the patient today.  Concerns regarding medicines are outlined above.  Medication changes, Labs and Tests ordered today are listed in the Patient Instructions below. Patient Instructions  Medication Instructions:  Your physician has recommended you make the following change in your medication:  Crestor 5 mg Weekly    Labwork: NONE   Testing/Procedures: Your physician has requested that you have an ankle brachial index (ABI). During this test an ultrasound and blood pressure cuff are used to evaluate the arteries that supply the arms and legs with blood. Allow thirty minutes for this exam. There are no restrictions or special instructions.   Follow-Up: Your physician wants you to follow-up in: 6 months.  You will receive a reminder letter in the mail two months in advance. If you don't receive a letter, please call our office to schedule the follow-up appointment.  Any Other Special Instructions Will Be Listed Below (If Applicable).  If you need a refill on your cardiac medications before your next appointment, please call your pharmacy. Thank you for choosing [facility]!  Signed, Erma Heritage, PA-C  09/29/2017 5:15 PM    [place] [address] [place], [place] [address] Phone: [phone]

## 2017-09-29 NOTE — Patient Instructions (Signed)
Medication Instructions:  Your physician has recommended you make the following change in your medication:  Crestor 5 mg Weekly    Labwork: NONE   Testing/Procedures: Your physician has requested that you have an ankle brachial index (ABI). During this test an ultrasound and blood pressure cuff are used to evaluate the arteries that supply the arms and legs with blood. Allow thirty minutes for this exam. There are no restrictions or special instructions.   Follow-Up: Your physician wants you to follow-up in: 6 months.  You will receive a reminder letter in the mail two months in advance. If you don't receive a letter, please call our office to schedule the follow-up appointment.   Any Other Special Instructions Will Be Listed Below (If Applicable).     If you need a refill on your cardiac medications before your next appointment, please call your pharmacy. Thank you for choosing [facility]!

## 2017-10-05 ENCOUNTER — Other Ambulatory Visit: Payer: Self-pay | Admitting: Student

## 2017-10-05 DIAGNOSIS — I739 Peripheral vascular disease, unspecified: Secondary | ICD-10-CM

## 2017-10-25 ENCOUNTER — Other Ambulatory Visit: Payer: Self-pay | Admitting: Cardiovascular Disease

## 2017-10-25 ENCOUNTER — Ambulatory Visit (INDEPENDENT_AMBULATORY_CARE_PROVIDER_SITE_OTHER): Payer: No Typology Code available for payment source

## 2017-10-25 DIAGNOSIS — I739 Peripheral vascular disease, unspecified: Secondary | ICD-10-CM | POA: Diagnosis not present

## 2017-10-31 ENCOUNTER — Encounter: Payer: Self-pay | Admitting: *Deleted

## 2017-10-31 ENCOUNTER — Telehealth: Payer: Self-pay | Admitting: *Deleted

## 2017-10-31 NOTE — Telephone Encounter (Signed)
Called patient with test results. No answer. Left message to call back.  

## 2017-10-31 NOTE — Telephone Encounter (Signed)
-----   Message from Erma Heritage, [place] sent at 10/28/2017  8:59 AM EDT ----- Please let the patient know his lower extremity doppler studies showed normal blood flow along the right lower extremity but there was evidence of arterial disease (blockage) along the left lower extremity. Looks like they have already arranged for him to undergo further imaging of this on 11/01/2017. Once all imaging is complete, we will plan to refer him to see Dr. Gwenlyn Found (Specialist in our group who can discuss possible interventions in regards to his PAD) given the discomfort he has been experiencing along his left leg. No PCP on file to forward results to.   Thanks,  [place]

## 2017-11-01 ENCOUNTER — Ambulatory Visit (INDEPENDENT_AMBULATORY_CARE_PROVIDER_SITE_OTHER): Payer: No Typology Code available for payment source

## 2017-11-01 DIAGNOSIS — I739 Peripheral vascular disease, unspecified: Secondary | ICD-10-CM

## 2017-11-10 ENCOUNTER — Telehealth: Payer: Self-pay | Admitting: *Deleted

## 2017-11-10 DIAGNOSIS — M79605 Pain in left leg: Secondary | ICD-10-CM

## 2017-11-10 NOTE — Telephone Encounter (Signed)
Notes recorded by Laurine Blazer, LPN on 87/0/6582 at 6:08 PM EDT Patient notified. No pcp. He agrees to seeing Dr. Gwenlyn Found. Will enter referral into Epic & notify scheduling. ------  Notes recorded by Laurine Blazer, LPN on 88/35/8446 at 5:54 PM EDT No answer.  ------  Notes recorded by Herminio Commons, MD on 11/02/2017 at 11:24 AM EDT This study demonstrates: No abdominal aortic aneurysm. There is an occluded left anterior tibial artery which would account for his pain and left foot and left great toe. Medication changes / Follow up studies / Other recommendations:  Have him see Dr. Quay Burow. Please send results to the PCP: Patient, No Pcp Per  [name], MD 11/02/2017 11:23 AM

## 2017-11-12 ENCOUNTER — Emergency Department (HOSPITAL_COMMUNITY)
Admission: EM | Admit: 2017-11-12 | Discharge: 2017-11-12 | Disposition: A | Discharge status: Home / Self Care | Payer: Medicare Other | Attending: Emergency Medicine | Admitting: Emergency Medicine

## 2017-11-12 ENCOUNTER — Encounter (HOSPITAL_COMMUNITY): Payer: Self-pay | Admitting: Emergency Medicine

## 2017-11-12 ENCOUNTER — Other Ambulatory Visit: Payer: Self-pay

## 2017-11-12 DIAGNOSIS — E1122 Type 2 diabetes mellitus with diabetic chronic kidney disease: Secondary | ICD-10-CM | POA: Diagnosis not present

## 2017-11-12 DIAGNOSIS — I2510 Atherosclerotic heart disease of native coronary artery without angina pectoris: Secondary | ICD-10-CM | POA: Insufficient documentation

## 2017-11-12 DIAGNOSIS — I130 Hypertensive heart and chronic kidney disease with heart failure and stage 1 through stage 4 chronic kidney disease, or unspecified chronic kidney disease: Secondary | ICD-10-CM | POA: Diagnosis not present

## 2017-11-12 DIAGNOSIS — F1410 Cocaine abuse, uncomplicated: Secondary | ICD-10-CM

## 2017-11-12 DIAGNOSIS — Z955 Presence of coronary angioplasty implant and graft: Secondary | ICD-10-CM | POA: Diagnosis not present

## 2017-11-12 DIAGNOSIS — Z794 Long term (current) use of insulin: Secondary | ICD-10-CM | POA: Diagnosis not present

## 2017-11-12 DIAGNOSIS — N182 Chronic kidney disease, stage 2 (mild): Secondary | ICD-10-CM | POA: Diagnosis not present

## 2017-11-12 DIAGNOSIS — I5022 Chronic systolic (congestive) heart failure: Secondary | ICD-10-CM | POA: Diagnosis not present

## 2017-11-12 DIAGNOSIS — F17210 Nicotine dependence, cigarettes, uncomplicated: Secondary | ICD-10-CM | POA: Insufficient documentation

## 2017-11-12 DIAGNOSIS — Z79899 Other long term (current) drug therapy: Secondary | ICD-10-CM | POA: Insufficient documentation

## 2017-11-12 DIAGNOSIS — I252 Old myocardial infarction: Secondary | ICD-10-CM | POA: Insufficient documentation

## 2017-11-12 NOTE — ED Provider Notes (Signed)
[facility] EMERGENCY DEPARTMENT Provider Note   CSN: [id number] Arrival date & time: 11/12/17  6720     History   Chief Complaint Chief Complaint  Patient presents with  . Medical Clearance    HPI [name] is a 62 y.o. male with medical history as below presents emergency department today for detox.  Patient reports that he currently smokes tobacco as well as uses crack cocaine and would like to stop. He is here with family who is supportive. He reports he has not tried to stop before. He denies any current complaints.  No chest pain or shortness of breath.  He last used cocaine yesterday.  He denies any IV drug use. He denies SI/HI/VH/AH. No alcohol use or other drug use.   HPI  Past Medical History:  Diagnosis Date  . Bulging lumbar disc   . CAD (coronary artery disease) [phone]   a. prior LAD stenting. b. s/p DES to [facility] 08/2015 @ [facility]. c. 04/2016 Cardiac cath at [facility]. # vessel CAD, with patent stent in the PLAD and RCA. Diffuse dLAD, OM2, and  RPDA disease. No aortic stenosis. Elevated LVEDP. No lesion to suggest ACS. Continue medical managment.  . CKD (chronic kidney disease), stage II   . Cocaine abuse ([place])   . DM2 (diabetes mellitus, type 2) ([place])   . HTN (hypertension)   . Hyperlipidemia   . Ischemic cardiomyopathy   . MI, old   . Obesity   . Tobacco use     Patient Active Problem List   Diagnosis Date Noted  . Chest pain 11/03/2016  . Constipation 11/03/2016  . CKD (chronic kidney disease), stage II 08/02/2016  . Cellulitis of right hand 08/02/2016  . Chronic pain 08/02/2016  . AKI (acute kidney injury) ([place]) 01/31/2016  . Lobar pneumonia ([place]) 01/31/2016  . Influenza A 01/31/2016  . Diarrhea 01/30/2016  . NSVT (nonsustained ventricular tachycardia) ([place]) 09/12/2015  . Precordial chest pain 09/10/2015  . Cocaine use 09/04/2015  . Near syncope 06/29/2015  . Essential hypertension 06/29/2015  . History of heart artery stents 06/29/2015   . Insulin dependent diabetes mellitus ([place]) 06/29/2015  . History of MI (myocardial infarction) 06/29/2015  . Chronic systolic CHF 28/36/6294  . Musculoskeletal chest pain 05/27/2015  . OSA on CPAP 01/21/2015  . Rectal bleeding   . Stage 3 chronic renal impairment associated with type 2 diabetes mellitus ([place]) 09/09/2013  . 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 CAD (coronary artery disease) 09/09/2013  . Cardiomyopathy, ischemic-EF 40-45% by echo 09/07/13 09/09/2013  . S/P LAD DES June 2014 09/06/2013  . Tobacco use disorder 09/06/2013  . Hyperglycemia 08/22/2012  . NSTEMI (non-ST elevated myocardial infarction) ([place]) 07/07/2012  . Dyslipidemia 07/07/2012  . Hypertensive heart disease     Past Surgical History:  Procedure Laterality Date  . CARDIAC CATHETERIZATION N/A 09/07/2015   Procedure: Left Heart Cath and Coronary Angiography;  Surgeon: Leonie Man, MD;  Location: [place] CV LAB;  Service: Cardiovascular;  Laterality: N/A;  . CARDIAC CATHETERIZATION N/A 09/07/2015   Procedure: Coronary Stent Intervention;  Surgeon: Leonie Man, MD;  Location: [place] CV LAB;  Service: Cardiovascular;  Laterality: N/A;  . CORONARY ANGIOGRAM  09/07/13   residual RCA and OM disease  . CORONARY ANGIOPLASTY WITH STENT PLACEMENT    . LEFT HEART CATH Bilateral 07/08/2012   Procedure: LEFT HEART CATH;  Surgeon: [name], MD;  Location: [facility] CATH LAB;  Service:  Cardiovascular;  Laterality: Bilateral;  . LEFT HEART CATHETERIZATION WITH CORONARY ANGIOGRAM N/A 09/06/2013   STEMI, 2nd ISR LAD. Procedure: LEFT HEART CATHETERIZATION WITH CORONARY ANGIOGRAM;  Surgeon: [name], MD;  Location: [facility] CATH LAB;  Service: Cardiovascular;  Laterality: N/A;  . PERCUTANEOUS CORONARY STENT INTERVENTION (PCI-S)  07/08/2012   Procedure: PERCUTANEOUS CORONARY STENT INTERVENTION (PCI-S);  Surgeon: [name], MD;  Location: [facility] CATH LAB;  Service: Cardiovascular;;  DES  LAD  . PERCUTANEOUS CORONARY STENT INTERVENTION (PCI-S) N/A 09/06/2013   Procedure: PERCUTANEOUS CORONARY STENT INTERVENTION (PCI-S);  Surgeon: [name], MD;  Location: [facility] CATH LAB;  Service: Cardiovascular;  Laterality: N/A;  Mid LAD 3.0/24mm Promus        Home Medications    Prior to Admission medications   Medication Sig Start Date End Date Taking? Authorizing Provider  aspirin EC 81 MG tablet Take 81 mg by mouth daily as needed.    Yes [provider]  Continuous Blood Gluc Receiver (FREESTYLE LIBRE READER) DEVI 1 packet by Does not apply route daily. 08/04/16  Yes Rexene Alberts, MD  Continuous Blood Gluc Sensor ([place]) MISC 1 Piece by Does not apply route continuous. 08/04/16  Yes Rexene Alberts, MD  insulin glargine (LANTUS) 100 UNIT/ML injection Inject into the skin daily.   Yes [provider]  metFORMIN (GLUCOPHAGE) 1000 MG tablet Take 1 tablet (1,000 mg total) by mouth 2 (two) times daily with a meal. Do not restart Metformin until Wednesday am 7/2 07/10/12  Yes Turner, Eber Hong, MD  nitroGLYCERIN (NITROSTAT) 0.4 MG SL tablet Place 1 tablet (0.4 mg total) under the tongue every 5 (five) minutes x 3 doses as needed for chest pain. 05/03/14  Yes Thurnell Lose, MD  rosuvastatin (CRESTOR) 5 MG tablet Take 1 tablet (5 mg total) by mouth once a week. 09/29/17 12/28/17  Erma Heritage, PA-C    Family History Family History  Problem Relation Age of Onset  . Hypertension Mother   . Diabetes Mother     Social History Social History   Tobacco Use  . Smoking status: Current Every Day Smoker    Packs/day: 0.25    Types: Cigarettes  . Smokeless tobacco: Never Used  Substance Use Topics  . Alcohol use: Not Currently  . Drug use: No    Types: Cocaine    Comment: stopped 1 year ago as of 10/25/16     Allergies   Patient has no known allergies.   Review of Systems Review of Systems  All other systems reviewed and are  negative.    Physical Exam Updated Vital Signs BP 140/90 (BP Location: Left Arm)   Pulse 80   Temp 98.4 F (36.9 C) (Oral)   Resp 18   Ht 5\' 9"  (1.753 m)   Wt 106.6 kg   SpO2 100%   BMI 34.70 kg/m   Physical Exam  Constitutional: He appears well-developed and well-nourished.  HENT:  Head: Normocephalic and atraumatic.  Right Ear: External ear normal.  Left Ear: External ear normal.  Nose: Nose normal.  Mouth/Throat: Uvula is midline, oropharynx is clear and moist and mucous membranes are normal. No tonsillar exudate.  Eyes: Pupils are equal, round, and reactive to light. Right eye exhibits no discharge. Left eye exhibits no discharge. No scleral icterus.  Neck: Trachea normal. Neck supple. No spinous process tenderness present. No neck rigidity. Normal range of motion present.  Cardiovascular: Normal rate, regular rhythm and intact distal pulses.  No murmur heard. Pulses:      Radial pulses are 2+ on the right side, and 2+ on the left side.       Posterior tibial pulses are 2+ on the right side, and 2+ on the left side.  No lower extremity swelling or edema. Calves symmetric in size bilaterally.  Pulmonary/Chest: Effort normal and breath sounds normal. He exhibits no tenderness.  Abdominal: Soft. Bowel sounds are normal. There is no tenderness. There is no rebound and no guarding.  Musculoskeletal: He exhibits no edema.  Lymphadenopathy:    He has no cervical adenopathy.  Neurological: He is alert.  Skin: Skin is warm and dry. No rash noted. He is not diaphoretic.  Psychiatric: He has a normal mood and affect.  Nursing note and vitals reviewed.    ED Treatments / Results  Labs (all labs ordered are listed, but only abnormal results are displayed) Labs Reviewed - No data to display  EKG None  Radiology No results found.  Procedures Procedures (including critical care time)  Medications Ordered in ED Medications - No data to display   Initial Impression /  Assessment and Plan / ED Course  I have reviewed the triage vital signs and the nursing notes.  Pertinent labs & imaging results that were available during my care of the patient were reviewed by me and considered in my medical decision making (see chart for details).     62 y.o. male presenting for detox from cocaine and tobacco. No alcohol or other drug use. No other complaints. Also denies SI/HI, audio and visual hallucinations. Vitals are stable. Patient is alert and oriented.  Patient is to be discharged home with behavioral health resources for finding a detoxification program.   Vital signs are stable at discharge.   BP 140/90 (BP Location: Left Arm)   Pulse 80   Temp 98.4 F (36.9 C) (Oral)   Resp 18   Ht 5\' 9"  (1.753 m)   Wt 106.6 kg   SpO2 100%   BMI 34.70 kg/m   Patient/family have voiced understanding and agreed to call tomorrow. They will also establish care with a pcp.  Patient case discussed with Dr. Roderic Palau who is in agreement with plan.  Final Clinical Impressions(s) / ED Diagnoses   Final diagnoses:  Cocaine abuse [facility])    ED Discharge Orders    None       Lorelle Gibbs 11/12/17 1126    Milton Ferguson, MD 11/12/17 1536

## 2017-11-12 NOTE — ED Triage Notes (Addendum)
Patient wants to be detox from "crack and cigarettes." Per patient last used crack yesterday, usually uses everyday. Denies any withdrawal symptoms at this time. Denies any prior detox attempts. Denies any SI or HI.

## 2017-11-12 NOTE — Discharge Instructions (Signed)
Please use resources. Call tomorrow. If you develop worsening or new concerning symptoms you can return to the emergency department for re-evaluation.

## 2017-11-28 ENCOUNTER — Encounter: Payer: Self-pay | Admitting: Cardiovascular Disease

## 2017-11-28 ENCOUNTER — Ambulatory Visit (INDEPENDENT_AMBULATORY_CARE_PROVIDER_SITE_OTHER): Payer: No Typology Code available for payment source | Admitting: Cardiovascular Disease

## 2017-11-28 VITALS — BP 122/84 | HR 78 | Ht 69.0 in | Wt 235.0 lb

## 2017-11-28 DIAGNOSIS — I739 Peripheral vascular disease, unspecified: Secondary | ICD-10-CM | POA: Insufficient documentation

## 2017-11-28 DIAGNOSIS — Z01812 Encounter for preprocedural laboratory examination: Secondary | ICD-10-CM

## 2017-11-28 DIAGNOSIS — R5383 Other fatigue: Secondary | ICD-10-CM | POA: Diagnosis not present

## 2017-11-28 NOTE — Addendum Note (Signed)
Addended by: Newt Minion on: 11/28/2017 01:00 PM   Modules accepted: Orders

## 2017-11-28 NOTE — Assessment & Plan Note (Signed)
[name] was referred to me by Dr. [name] for evaluation of PAD.  He has lifestyle limiting claudication on the left.  He has Doppler studies that suggest a high-grade left iliac stenosis with monophasic waveforms and wishes to proceed with invasive endovascular approach.

## 2017-11-28 NOTE — Patient Instructions (Addendum)
Medication Instructions:  Your physician recommends that you continue on your current medications as directed. Please refer to the Current Medication list given to you today.  If you need a refill on your cardiac medications before your next appointment, please call your pharmacy.   Lab work: Your physician recommends that you return for lab work in: TODAY  If you have labs (blood work) drawn today and your tests are completely normal, you will receive your results only by: Marland Kitchen MyChart Message (if you have MyChart) OR . A paper copy in the mail If you have any lab test that is abnormal or we need to change your treatment, we will call you to review the results.  Testing/Procedures: Your physician has requested that you have a lower extremity arterial doppler- During this test, ultrasound is used to evaluate arterial blood flow in the legs. Allow approximately one hour for this exam.  Your physician has requested that you have an ankle brachial index (ABI). During this test an ultrasound and blood pressure cuff are used to evaluate the arteries that supply the arms and legs with blood. Allow thirty minutes for this exam. There are no restrictions or special instructions.  SCHEDULE 1 WEEK FROM 11/30/2017  Follow-Up: At [facility], you and your health needs are our priority.  As part of our continuing mission to provide you with exceptional heart care, we have created designated Provider Care Teams.  These Care Teams include your primary Cardiologist (physician) and Advanced Practice Providers (APPs -  Physician Assistants and Nurse Practitioners) who all work together to provide you with the care you need, when you need it. You will need a follow up appointment in 2 weeks FROM 11/30/2017.  Please call our office 2 months in advance to schedule this appointment.  You may see Quay Burow, MD or one of the following Advanced Practice Providers on your designated Care Team:   Kerin Ransom,  PA-C Roby Lofts, [place] . Sande Rives, PA-C  Any Other Special Instructions Will Be Listed Below (If Applicable).

## 2017-11-28 NOTE — Addendum Note (Signed)
Addended by: Newt Minion on: 11/28/2017 10:53 PM   Modules accepted: Orders, SmartSet

## 2017-11-28 NOTE — Progress Notes (Signed)
11/28/2017 [name]   [birth year]  [id number]  Primary Physician System, Pcp Not In Primary Cardiologist: Lorretta Harp MD [name], [place], [place]  HPI:  [name] is a 62 y.o. moderately overweight divorced African-American male father of 2 children referred by Dr. Bronson [name] for peripheral vascular evaluation because of lifestyle limiting claudication.  He does have a history of CAD status post LAD and RCA intervention in the past.  Other problems include treated diabetes and hyperlipidemia.  He is on disability because of back disease and heart disease as well.  He is a retired [employer] administrator.  He had Dopplers performed 11/01/2017 revealing a right ABI of 1.04 and a left of 0.71 with a high-frequency signal in his left common iliac artery.  He has typical claudication symptoms on that side.   Current Meds  Medication Sig  . aspirin EC 81 MG tablet Take 81 mg by mouth daily as needed.   . Continuous Blood Gluc Receiver (FREESTYLE LIBRE READER) DEVI 1 packet by Does not apply route daily.  . Continuous Blood Gluc Sensor (FREESTYLE LIBRE SENSOR SYSTEM) MISC 1 Piece by Does not apply route continuous.  . insulin glargine (LANTUS) 100 UNIT/ML injection Inject into the skin daily.  . metFORMIN (GLUCOPHAGE) 1000 MG tablet Take 1 tablet (1,000 mg total) by mouth 2 (two) times daily with a meal. Do not restart Metformin until Wednesday am 7/2  . nitroGLYCERIN (NITROSTAT) 0.4 MG SL tablet Place 1 tablet (0.4 mg total) under the tongue every 5 (five) minutes x 3 doses as needed for chest pain.  . rosuvastatin (CRESTOR) 5 MG tablet Take 1 tablet (5 mg total) by mouth once a week.     No Known Allergies  Social History   Socioeconomic History  . Marital status: Divorced    Spouse name: Not on file  . Number of children: Not on file  . Years of education: Not on file  . Highest education level: Not on file  Occupational History  . Not on file  Social Needs    . Financial resource strain: Not on file  . Food insecurity:    Worry: Not on file    Inability: Not on file  . Transportation needs:    Medical: Not on file    Non-medical: Not on file  Tobacco Use  . Smoking status: Current Every Day Smoker    Packs/day: 0.25    Types: Cigarettes  . Smokeless tobacco: Never Used  Substance and Sexual Activity  . Alcohol use: Not Currently  . Drug use: No    Types: Cocaine    Comment: stopped 1 year ago as of 10/25/16  . Sexual activity: Yes  Lifestyle  . Physical activity:    Days per week: Not on file    Minutes per session: Not on file  . Stress: Not on file  Relationships  . Social connections:    Talks on phone: Not on file    Gets together: Not on file    Attends religious service: Not on file    Active member of club or organization: Not on file    Attends meetings of clubs or organizations: Not on file    Relationship status: Not on file  . Intimate partner violence:    Fear of current or ex partner: Not on file    Emotionally abused: Not on file    Physically abused: Not on file    Forced sexual activity:  Not on file  Other Topics Concern  . Not on file  Social History Narrative   ** Merged History Encounter **         Review of Systems: General: negative for chills, fever, night sweats or weight changes.  Cardiovascular: negative for chest pain, dyspnea on exertion, edema, orthopnea, palpitations, paroxysmal nocturnal dyspnea or shortness of breath Dermatological: negative for rash Respiratory: negative for cough or wheezing Urologic: negative for hematuria Abdominal: negative for nausea, vomiting, diarrhea, bright red blood per rectum, melena, or hematemesis Neurologic: negative for visual changes, syncope, or dizziness All other systems reviewed and are otherwise negative except as noted above.    Blood pressure 122/84, pulse 78, height 5\' 9"  (1.753 m), weight 235 lb (106.6 kg), SpO2 95 %.  General appearance:  alert and no distress Neck: no adenopathy, no carotid bruit, no JVD, supple, symmetrical, trachea midline and thyroid not enlarged, symmetric, no tenderness/mass/nodules Lungs: clear to auscultation bilaterally Heart: regular rate and rhythm, S1, S2 normal, no murmur, click, rub or gallop Extremities: extremities normal, atraumatic, no cyanosis or edema Pulses: 2+ and symmetric Skin: Skin color, texture, turgor normal. No rashes or lesions Neurologic: Alert and oriented X 3, normal strength and tone. Normal symmetric reflexes. Normal coordination and gait  EKG not performed today  ASSESSMENT AND PLAN:   Peripheral arterial disease [facility]) [name] was referred to me by Dr. Bronson [name] for evaluation of PAD.  He has lifestyle limiting claudication on the left.  He has Doppler studies that suggest a high-grade left iliac stenosis with monophasic waveforms and wishes to proceed with invasive endovascular approach.      Lorretta Harp MD FACP,FACC,FAHA, [facility] 11/28/2017 11:39 AM

## 2017-11-28 NOTE — H&P (View-Only) (Signed)
11/28/2017 [name]   05/23/1955  [id number]  Primary Physician System, Pcp Not In Primary Cardiologist: Lorretta Harp MD [name], [name], [place]  HPI:  [name] is a 62 y.o. moderately overweight divorced African-American male father of 2 children referred by Dr. Bronson [name] for peripheral vascular evaluation because of lifestyle limiting claudication.  He does have a history of CAD status post LAD and RCA intervention in the past.  Other problems include treated diabetes and hyperlipidemia.  He is on disability because of back disease and heart disease as well.  He is a retired [employer] administrator.  He had Dopplers performed 11/01/2017 revealing a right ABI of 1.04 and a left of 0.71 with a high-frequency signal in his left common iliac artery.  He has typical claudication symptoms on that side.   Current Meds  Medication Sig  . aspirin EC 81 MG tablet Take 81 mg by mouth daily as needed.   . Continuous Blood Gluc Receiver (FREESTYLE LIBRE READER) DEVI 1 packet by Does not apply route daily.  . Continuous Blood Gluc Sensor (FREESTYLE LIBRE SENSOR SYSTEM) MISC 1 Piece by Does not apply route continuous.  . insulin glargine (LANTUS) 100 UNIT/ML injection Inject into the skin daily.  . metFORMIN (GLUCOPHAGE) 1000 MG tablet Take 1 tablet (1,000 mg total) by mouth 2 (two) times daily with a meal. Do not restart Metformin until Wednesday am 7/2  . nitroGLYCERIN (NITROSTAT) 0.4 MG SL tablet Place 1 tablet (0.4 mg total) under the tongue every 5 (five) minutes x 3 doses as needed for chest pain.  . rosuvastatin (CRESTOR) 5 MG tablet Take 1 tablet (5 mg total) by mouth once a week.     No Known Allergies  Social History   Socioeconomic History  . Marital status: Divorced    Spouse name: Not on file  . Number of children: Not on file  . Years of education: Not on file  . Highest education level: Not on file  Occupational History  . Not on file  Social Needs    . Financial resource strain: Not on file  . Food insecurity:    Worry: Not on file    Inability: Not on file  . Transportation needs:    Medical: Not on file    Non-medical: Not on file  Tobacco Use  . Smoking status: Current Every Day Smoker    Packs/day: 0.25    Types: Cigarettes  . Smokeless tobacco: Never Used  Substance and Sexual Activity  . Alcohol use: Not Currently  . Drug use: No    Types: Cocaine    Comment: stopped 1 year ago as of 10/25/16  . Sexual activity: Yes  Lifestyle  . Physical activity:    Days per week: Not on file    Minutes per session: Not on file  . Stress: Not on file  Relationships  . Social connections:    Talks on phone: Not on file    Gets together: Not on file    Attends religious service: Not on file    Active member of club or organization: Not on file    Attends meetings of clubs or organizations: Not on file    Relationship status: Not on file  . Intimate partner violence:    Fear of current or ex partner: Not on file    Emotionally abused: Not on file    Physically abused: Not on file    Forced sexual activity:  Not on file  Other Topics Concern  . Not on file  Social History Narrative   ** Merged History Encounter **         Review of Systems: General: negative for chills, fever, night sweats or weight changes.  Cardiovascular: negative for chest pain, dyspnea on exertion, edema, orthopnea, palpitations, paroxysmal nocturnal dyspnea or shortness of breath Dermatological: negative for rash Respiratory: negative for cough or wheezing Urologic: negative for hematuria Abdominal: negative for nausea, vomiting, diarrhea, bright red blood per rectum, melena, or hematemesis Neurologic: negative for visual changes, syncope, or dizziness All other systems reviewed and are otherwise negative except as noted above.    Blood pressure 122/84, pulse 78, height 5\' 9"  (1.753 m), weight 235 lb (106.6 kg), SpO2 95 %.  General appearance:  alert and no distress Neck: no adenopathy, no carotid bruit, no JVD, supple, symmetrical, trachea midline and thyroid not enlarged, symmetric, no tenderness/mass/nodules Lungs: clear to auscultation bilaterally Heart: regular rate and rhythm, S1, S2 normal, no murmur, click, rub or gallop Extremities: extremities normal, atraumatic, no cyanosis or edema Pulses: 2+ and symmetric Skin: Skin color, texture, turgor normal. No rashes or lesions Neurologic: Alert and oriented X 3, normal strength and tone. Normal symmetric reflexes. Normal coordination and gait  EKG not performed today  ASSESSMENT AND PLAN:   Peripheral arterial disease [name]) [name] was referred to me by Dr. Bronson [name] for evaluation of PAD.  He has lifestyle limiting claudication on the left.  He has Doppler studies that suggest a high-grade left iliac stenosis with monophasic waveforms and wishes to proceed with invasive endovascular approach.      Lorretta Harp MD FACP,FACC,FAHA, [facility] 11/28/2017 11:39 AM

## 2017-11-29 ENCOUNTER — Telehealth: Payer: Self-pay | Admitting: *Deleted

## 2017-11-29 LAB — BASIC METABOLIC PANEL
BUN / CREAT RATIO: 14 (ref 10–24)
BUN: 18 mg/dL (ref 8–27)
CHLORIDE: 102 mmol/L (ref 96–106)
CO2: 23 mmol/L (ref 20–29)
Calcium: 9.4 mg/dL (ref 8.6–10.2)
Creatinine, Ser: 1.32 mg/dL — ABNORMAL HIGH (ref 0.76–1.27)
GFR calc non Af Amer: 57 mL/min/{1.73_m2} — ABNORMAL LOW (ref >59)
GFR, EST AFRICAN AMERICAN: 66 mL/min/{1.73_m2} (ref >59)
Glucose: 163 mg/dL — ABNORMAL HIGH (ref 65–99)
Potassium: 5.2 mmol/L (ref 3.5–5.2)
Sodium: 141 mmol/L (ref 134–144)

## 2017-11-29 LAB — CBC WITH DIFFERENTIAL/PLATELET
BASOS: 1 %
Basophils Absolute: 0.1 10*3/uL (ref 0.0–0.2)
EOS (ABSOLUTE): 0.6 10*3/uL — AB (ref 0.0–0.4)
Eos: 6 %
Hematocrit: 47.9 % (ref 37.5–51.0)
Hemoglobin: 16 g/dL (ref 13.0–17.7)
IMMATURE GRANS (ABS): 0.1 10*3/uL (ref 0.0–0.1)
Immature Granulocytes: 1 %
LYMPHS ABS: 1.9 10*3/uL (ref 0.7–3.1)
LYMPHS: 20 %
MCH: 25.6 pg — AB (ref 26.6–33.0)
MCHC: 33.4 g/dL (ref 31.5–35.7)
MCV: 77 fL — AB (ref 79–97)
MONOS ABS: 0.6 10*3/uL (ref 0.1–0.9)
Monocytes: 6 %
NEUTROS ABS: 6.5 10*3/uL (ref 1.4–7.0)
Neutrophils: 66 %
Platelets: 248 10*3/uL (ref 150–450)
RBC: 6.26 x10E6/uL — ABNORMAL HIGH (ref 4.14–5.80)
RDW: 15.3 % (ref 12.3–15.4)
WBC: 9.7 10*3/uL (ref 3.4–10.8)

## 2017-11-29 LAB — TSH: TSH: 2.59 u[IU]/mL (ref 0.450–4.500)

## 2017-11-29 NOTE — Telephone Encounter (Signed)
Pt contacted pre-catheterization scheduled at [facility] for: Thursday November 30, 2017 7:30 AM Verified arrival time and place: [place] Entrance A at: 5:30 AM  No solid food after midnight prior to cath, clear liquids until 5 AM day of procedure.  Hold: Metformin-day of procedure and 48 hours post procedure. Insulin-1/2 PM prior to procedure.   Except hold medications AM meds can be  taken pre-cath with sip of water including: ASA 81 mg  Confirmed patient has responsible person to drive home post procedure and for 24 hours after you arrive home: yes

## 2017-11-30 ENCOUNTER — Ambulatory Visit (HOSPITAL_COMMUNITY)
Admission: RE | Admit: 2017-11-30 | Discharge: 2017-12-01 | Disposition: A | Discharge status: Home / Self Care | Payer: No Typology Code available for payment source | Source: Ambulatory Visit | Attending: Cardiovascular Disease | Admitting: Cardiovascular Disease

## 2017-11-30 ENCOUNTER — Encounter (HOSPITAL_COMMUNITY): Payer: Self-pay | Admitting: General Practice

## 2017-11-30 ENCOUNTER — Other Ambulatory Visit: Payer: Self-pay

## 2017-11-30 ENCOUNTER — Encounter (HOSPITAL_COMMUNITY)
Admission: RE | Disposition: A | Discharge status: Home / Self Care | Payer: Self-pay | Source: Ambulatory Visit | Attending: Cardiovascular Disease

## 2017-11-30 DIAGNOSIS — E669 Obesity, unspecified: Secondary | ICD-10-CM | POA: Diagnosis not present

## 2017-11-30 DIAGNOSIS — E1151 Type 2 diabetes mellitus with diabetic peripheral angiopathy without gangrene: Secondary | ICD-10-CM | POA: Diagnosis not present

## 2017-11-30 DIAGNOSIS — F17200 Nicotine dependence, unspecified, uncomplicated: Secondary | ICD-10-CM | POA: Diagnosis present

## 2017-11-30 DIAGNOSIS — Z7902 Long term (current) use of antithrombotics/antiplatelets: Secondary | ICD-10-CM | POA: Insufficient documentation

## 2017-11-30 DIAGNOSIS — E1149 Type 2 diabetes mellitus with other diabetic neurological complication: Secondary | ICD-10-CM | POA: Diagnosis present

## 2017-11-30 DIAGNOSIS — I2510 Atherosclerotic heart disease of native coronary artery without angina pectoris: Secondary | ICD-10-CM | POA: Diagnosis not present

## 2017-11-30 DIAGNOSIS — I119 Hypertensive heart disease without heart failure: Secondary | ICD-10-CM | POA: Insufficient documentation

## 2017-11-30 DIAGNOSIS — E785 Hyperlipidemia, unspecified: Secondary | ICD-10-CM | POA: Insufficient documentation

## 2017-11-30 DIAGNOSIS — Z6834 Body mass index (BMI) 34.0-34.9, adult: Secondary | ICD-10-CM | POA: Diagnosis not present

## 2017-11-30 DIAGNOSIS — Z794 Long term (current) use of insulin: Secondary | ICD-10-CM | POA: Diagnosis not present

## 2017-11-30 DIAGNOSIS — E1140 Type 2 diabetes mellitus with diabetic neuropathy, unspecified: Secondary | ICD-10-CM | POA: Insufficient documentation

## 2017-11-30 DIAGNOSIS — I255 Ischemic cardiomyopathy: Secondary | ICD-10-CM | POA: Diagnosis not present

## 2017-11-30 DIAGNOSIS — Z7982 Long term (current) use of aspirin: Secondary | ICD-10-CM | POA: Diagnosis not present

## 2017-11-30 DIAGNOSIS — I70212 Atherosclerosis of native arteries of extremities with intermittent claudication, left leg: Secondary | ICD-10-CM | POA: Insufficient documentation

## 2017-11-30 DIAGNOSIS — I743 Embolism and thrombosis of arteries of the lower extremities: Secondary | ICD-10-CM

## 2017-11-30 DIAGNOSIS — R5383 Other fatigue: Secondary | ICD-10-CM

## 2017-11-30 DIAGNOSIS — Z01812 Encounter for preprocedural laboratory examination: Secondary | ICD-10-CM

## 2017-11-30 DIAGNOSIS — I10 Essential (primary) hypertension: Secondary | ICD-10-CM | POA: Diagnosis present

## 2017-11-30 DIAGNOSIS — I739 Peripheral vascular disease, unspecified: Secondary | ICD-10-CM

## 2017-11-30 HISTORY — DX: Low back pain: M54.5

## 2017-11-30 HISTORY — DX: Headache: R51

## 2017-11-30 HISTORY — DX: Gastro-esophageal reflux disease without esophagitis: K21.9

## 2017-11-30 HISTORY — DX: Low back pain, unspecified: M54.50

## 2017-11-30 HISTORY — DX: Other chronic pain: G89.29

## 2017-11-30 HISTORY — DX: Headache, unspecified: R51.9

## 2017-11-30 HISTORY — DX: Sleep apnea, unspecified: G47.30

## 2017-11-30 HISTORY — PX: INSERTION OF ILIAC STENT: SHX6256

## 2017-11-30 HISTORY — PX: LOWER EXTREMITY ANGIOGRAPHY: CATH118251

## 2017-11-30 HISTORY — DX: Pneumonia, unspecified organism: J18.9

## 2017-11-30 LAB — POCT ACTIVATED CLOTTING TIME
Activated Clotting Time: 246 seconds
Activated Clotting Time: 191 seconds
Activated Clotting Time: 175 seconds

## 2017-11-30 LAB — GLUCOSE, CAPILLARY
GLUCOSE-CAPILLARY: 270 mg/dL — AB (ref 70–99)
GLUCOSE-CAPILLARY: 252 mg/dL — AB (ref 70–99)
Glucose-Capillary: 194 mg/dL — ABNORMAL HIGH (ref 70–99)
Glucose-Capillary: 193 mg/dL — ABNORMAL HIGH (ref 70–99)
Glucose-Capillary: 162 mg/dL — ABNORMAL HIGH (ref 70–99)

## 2017-11-30 SURGERY — LOWER EXTREMITY ANGIOGRAPHY
Anesthesia: LOCAL

## 2017-11-30 MED ORDER — CLOPIDOGREL BISULFATE 300 MG PO TABS
ORAL_TABLET | ORAL | Status: AC
  Filled 2017-11-30: qty 1

## 2017-11-30 MED ORDER — ASPIRIN 81 MG PO CHEW: 81.0000 mg | CHEWABLE_TABLET | ORAL | Status: DC

## 2017-11-30 MED ORDER — ROSUVASTATIN CALCIUM 10 MG PO TABS
5.0000 mg | ORAL_TABLET | ORAL | Status: DC
  Administered 2017-11-30: 16:00:00 5 mg via ORAL
  Filled 2017-11-30: qty 1

## 2017-11-30 MED ORDER — LABETALOL HCL 5 MG/ML IV SOLN: 10.0000 mg | INTRAVENOUS | Status: DC | PRN

## 2017-11-30 MED ORDER — SODIUM CHLORIDE 0.9% FLUSH: 3.0000 mL | Freq: Two times a day (BID) | INTRAVENOUS | Status: DC

## 2017-11-30 MED ORDER — MIDAZOLAM HCL 2 MG/2ML IJ SOLN
INTRAMUSCULAR | Status: AC
  Filled 2017-11-30: qty 2

## 2017-11-30 MED ORDER — SODIUM CHLORIDE 0.9 % WEIGHT BASED INFUSION
3.0000 mL/kg/h | INTRAVENOUS | Status: DC
  Administered 2017-11-30: 3 mL/kg/h via INTRAVENOUS

## 2017-11-30 MED ORDER — HYDRALAZINE HCL 20 MG/ML IJ SOLN: 5.0000 mg | INTRAMUSCULAR | Status: DC | PRN

## 2017-11-30 MED ORDER — SODIUM CHLORIDE 0.9% FLUSH: 3.0000 mL | INTRAVENOUS | Status: DC | PRN

## 2017-11-30 MED ORDER — MORPHINE SULFATE (PF) 4 MG/ML IV SOLN
INTRAVENOUS | Status: AC
  Filled 2017-11-30: qty 1

## 2017-11-30 MED ORDER — HEPARIN (PORCINE) IN NACL 1000-0.9 UT/500ML-% IV SOLN
INTRAVENOUS | Status: DC | PRN
  Administered 2017-11-30 (×2): 500 mL

## 2017-11-30 MED ORDER — INSULIN ASPART 100 UNIT/ML ~~LOC~~ SOLN
0.0000 [IU] | Freq: Three times a day (TID) | SUBCUTANEOUS | Status: DC
  Administered 2017-11-30 (×2): 5 [IU] via SUBCUTANEOUS
  Administered 2017-12-01: 2 [IU] via SUBCUTANEOUS

## 2017-11-30 MED ORDER — SODIUM CHLORIDE 0.9 % IV SOLN: 250.0000 mL | INTRAVENOUS | Status: DC | PRN

## 2017-11-30 MED ORDER — LIDOCAINE HCL (PF) 1 % IJ SOLN
INTRAMUSCULAR | Status: DC | PRN
  Administered 2017-11-30: 15 mL

## 2017-11-30 MED ORDER — MORPHINE SULFATE (PF) 10 MG/ML IV SOLN
4.0000 mg | INTRAVENOUS | Status: DC | PRN
  Administered 2017-11-30: 4 mg via INTRAVENOUS

## 2017-11-30 MED ORDER — NICOTINE 14 MG/24HR TD PT24
14.0000 mg | MEDICATED_PATCH | Freq: Every day | TRANSDERMAL | Status: DC
  Administered 2017-11-30: 21:00:00 14 mg via TRANSDERMAL
  Filled 2017-11-30 (×2): qty 1

## 2017-11-30 MED ORDER — SODIUM CHLORIDE 0.9 % WEIGHT BASED INFUSION: 1.0000 mL/kg/h | INTRAVENOUS | Status: DC

## 2017-11-30 MED ORDER — HEPARIN (PORCINE) IN NACL 1000-0.9 UT/500ML-% IV SOLN
INTRAVENOUS | Status: AC
  Filled 2017-11-30: qty 1000

## 2017-11-30 MED ORDER — NITROGLYCERIN 0.4 MG SL SUBL: 0.4000 mg | SUBLINGUAL_TABLET | SUBLINGUAL | Status: DC | PRN

## 2017-11-30 MED ORDER — INSULIN ASPART 100 UNIT/ML ~~LOC~~ SOLN: 0.0000 [IU] | Freq: Every day | SUBCUTANEOUS | Status: DC

## 2017-11-30 MED ORDER — MIDAZOLAM HCL 2 MG/2ML IJ SOLN
INTRAMUSCULAR | Status: DC | PRN
  Administered 2017-11-30 (×2): 1 mg via INTRAVENOUS

## 2017-11-30 MED ORDER — ONDANSETRON HCL 4 MG/2ML IJ SOLN: 4.0000 mg | Freq: Four times a day (QID) | INTRAMUSCULAR | Status: DC | PRN

## 2017-11-30 MED ORDER — FENTANYL CITRATE (PF) 100 MCG/2ML IJ SOLN
INTRAMUSCULAR | Status: AC
  Filled 2017-11-30: qty 2

## 2017-11-30 MED ORDER — LIDOCAINE HCL (PF) 1 % IJ SOLN
INTRAMUSCULAR | Status: AC
  Filled 2017-11-30: qty 30

## 2017-11-30 MED ORDER — CLOPIDOGREL BISULFATE 300 MG PO TABS
ORAL_TABLET | ORAL | Status: DC | PRN
  Administered 2017-11-30: 300 mg via ORAL

## 2017-11-30 MED ORDER — IODIXANOL 320 MG/ML IV SOLN
INTRAVENOUS | Status: DC | PRN
  Administered 2017-11-30: 125 mL via INTRA_ARTERIAL

## 2017-11-30 MED ORDER — HEPARIN SODIUM (PORCINE) 1000 UNIT/ML IJ SOLN
INTRAMUSCULAR | Status: AC
  Filled 2017-11-30: qty 1

## 2017-11-30 MED ORDER — SODIUM CHLORIDE 0.9% FLUSH
3.0000 mL | Freq: Two times a day (BID) | INTRAVENOUS | Status: DC
  Administered 2017-11-30 – 2017-12-01 (×2): 3 mL via INTRAVENOUS

## 2017-11-30 MED ORDER — SODIUM CHLORIDE 0.9 % IV SOLN: INTRAVENOUS | Status: AC

## 2017-11-30 MED ORDER — FENTANYL CITRATE (PF) 100 MCG/2ML IJ SOLN
INTRAMUSCULAR | Status: DC | PRN
  Administered 2017-11-30 (×3): 25 ug via INTRAVENOUS

## 2017-11-30 MED ORDER — INSULIN GLARGINE 100 UNIT/ML ~~LOC~~ SOLN
10.0000 [IU] | Freq: Every day | SUBCUTANEOUS | Status: DC
  Administered 2017-11-30: 10 [IU] via SUBCUTANEOUS
  Filled 2017-11-30: qty 0.1

## 2017-11-30 MED ORDER — ASPIRIN EC 81 MG PO TBEC: 81.0000 mg | DELAYED_RELEASE_TABLET | Freq: Every day | ORAL | Status: DC

## 2017-11-30 MED ORDER — ASPIRIN EC 81 MG PO TBEC
81.0000 mg | DELAYED_RELEASE_TABLET | Freq: Every day | ORAL | Status: DC
  Administered 2017-12-01: 81 mg via ORAL
  Filled 2017-11-30: qty 1

## 2017-11-30 MED ORDER — HEPARIN SODIUM (PORCINE) 1000 UNIT/ML IJ SOLN
INTRAMUSCULAR | Status: DC | PRN
  Administered 2017-11-30: 8000 [IU] via INTRAVENOUS

## 2017-11-30 MED ORDER — ACETAMINOPHEN 325 MG PO TABS: 650.0000 mg | ORAL_TABLET | ORAL | Status: DC | PRN

## 2017-11-30 MED ORDER — MORPHINE SULFATE (PF) 4 MG/ML IV SOLN
4.0000 mg | INTRAVENOUS | Status: DC | PRN
  Administered 2017-11-30 – 2017-12-01 (×5): 4 mg via INTRAVENOUS
  Filled 2017-11-30 (×5): qty 1

## 2017-11-30 MED ORDER — CLOPIDOGREL BISULFATE 75 MG PO TABS
75.0000 mg | ORAL_TABLET | Freq: Every day | ORAL | Status: DC
  Administered 2017-12-01: 75 mg via ORAL
  Filled 2017-11-30: qty 1

## 2017-11-30 SURGICAL SUPPLY — 11 items
CATH ANGIO 5F PIGTAIL 65CM (CATHETERS) ×1 IMPLANT
KIT ENCORE 26 ADVANTAGE (KITS) ×1 IMPLANT
KIT PV (KITS) ×3 IMPLANT
SHEATH BRITE TIP 7FR 35CM (SHEATH) ×1 IMPLANT
SHEATH PINNACLE 5F 10CM (SHEATH) ×1 IMPLANT
SHEATH PINNACLE 7F 10CM (SHEATH) ×1 IMPLANT
STENT VIABAHN 7X29X80 VBX (Permanent Stent) ×1 IMPLANT
SYR MEDRAD MARK 7 150ML (SYRINGE) ×1 IMPLANT
TRANSDUCER W/STOPCOCK (MISCELLANEOUS) ×3 IMPLANT
TRAY PV CATH (CUSTOM PROCEDURE TRAY) ×3 IMPLANT
WIRE HITORQ VERSACORE ST 145CM (WIRE) ×1 IMPLANT

## 2017-11-30 NOTE — Progress Notes (Signed)
Notified [name] of patient stating he has a boil to right groin that has been draining .  Anderson Malta will let Dr. Gwenlyn Found know.

## 2017-11-30 NOTE — Interval H&P Note (Signed)
History and Physical Interval Note:  11/30/2017 7:40 AM  [name]  has presented today for surgery, with the diagnosis of claudication  The various methods of treatment have been discussed with the patient and family. After consideration of risks, benefits and other options for treatment, the patient has consented to  Procedure(s): LOWER EXTREMITY ANGIOGRAPHY (N/A) as a surgical intervention .  The patient's history has been reviewed, patient examined, no change in status, stable for surgery.  I have reviewed the patient's chart and labs.  Questions were answered to the patient's satisfaction.     Quay Burow

## 2017-11-30 NOTE — Progress Notes (Addendum)
Site area: RFA Site Prior to Removal:  Level 0 Pressure Applied For:30 min Manual:   yes Patient Status During Pull:  stable Post Pull Site:  Level 0 Post Pull Instructions Given:  yes Post Pull Pulses Present: doppler Dressing Applied:  clear Bedrest begins @ 1030 till 1630 Comments:

## 2017-12-01 ENCOUNTER — Other Ambulatory Visit: Payer: Self-pay | Admitting: Cardiovascular Disease

## 2017-12-01 DIAGNOSIS — I739 Peripheral vascular disease, unspecified: Secondary | ICD-10-CM

## 2017-12-01 DIAGNOSIS — E1149 Type 2 diabetes mellitus with other diabetic neurological complication: Secondary | ICD-10-CM | POA: Diagnosis not present

## 2017-12-01 DIAGNOSIS — Z95820 Peripheral vascular angioplasty status with implants and grafts: Secondary | ICD-10-CM

## 2017-12-01 DIAGNOSIS — I10 Essential (primary) hypertension: Secondary | ICD-10-CM | POA: Diagnosis not present

## 2017-12-01 DIAGNOSIS — E1151 Type 2 diabetes mellitus with diabetic peripheral angiopathy without gangrene: Secondary | ICD-10-CM | POA: Diagnosis not present

## 2017-12-01 LAB — BASIC METABOLIC PANEL
ANION GAP: 8 (ref 5–15)
BUN: 17 mg/dL (ref 8–23)
CO2: 25 mmol/L (ref 22–32)
Calcium: 8.5 mg/dL — ABNORMAL LOW (ref 8.9–10.3)
Chloride: 102 mmol/L (ref 98–111)
Creatinine, Ser: 1.42 mg/dL — ABNORMAL HIGH (ref 0.61–1.24)
GFR calc Af Amer: 59 mL/min — ABNORMAL LOW (ref >60)
GFR, EST NON AFRICAN AMERICAN: 51 mL/min — AB (ref >60)
GLUCOSE: 221 mg/dL — AB (ref 70–99)
POTASSIUM: 4.1 mmol/L (ref 3.5–5.1)
SODIUM: 135 mmol/L (ref 135–145)

## 2017-12-01 LAB — CBC
HCT: 46.2 % (ref 39.0–52.0)
HEMOGLOBIN: 14.4 g/dL (ref 13.0–17.0)
MCH: 25.3 pg — ABNORMAL LOW (ref 26.0–34.0)
MCHC: 31.2 g/dL (ref 30.0–36.0)
MCV: 81.2 fL (ref 80.0–100.0)
Platelets: 226 10*3/uL (ref 150–400)
RBC: 5.69 MIL/uL (ref 4.22–5.81)
RDW: 15.6 % — ABNORMAL HIGH (ref 11.5–15.5)
WBC: 11.4 10*3/uL — AB (ref 4.0–10.5)
nRBC: 0 % (ref 0.0–0.2)

## 2017-12-01 LAB — GLUCOSE, CAPILLARY: GLUCOSE-CAPILLARY: 195 mg/dL — AB (ref 70–99)

## 2017-12-01 MED ORDER — CLOPIDOGREL BISULFATE 75 MG PO TABS: 75.0000 mg | ORAL_TABLET | Freq: Every day | ORAL | 9 refills | Status: DC

## 2017-12-01 MED ORDER — NICOTINE 14 MG/24HR TD PT24: 14.0000 mg | MEDICATED_PATCH | Freq: Every day | TRANSDERMAL | 0 refills | Status: DC

## 2017-12-01 MED FILL — CLOPIDOGREL 75 MG TABLET: 75 | 90 days supply | Qty: 90 | Fill #0

## 2017-12-01 NOTE — Discharge Summary (Signed)
Discharge Summary    [name] ID: [name] MRN: [id number]; DOB: November 10, 1955  Admit date: 11/30/2017 Discharge date: 12/01/2017  Primary Care Provider: System, Ship Bottom Not In  Primary Cardiologist: Quay Burow, MD   Discharge Diagnoses    Active Problems:   Hypertensive heart disease   Dyslipidemia   Tobacco use disorder   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   CAD (coronary artery disease)   Cardiomyopathy, ischemic-EF 40-45% by echo 09/07/13   Essential hypertension   Claudication in peripheral vascular disease ([place])  Allergies No Known Allergies  Diagnostic Studies/Procedures    Lower extremity angiography 11/30/17:  Angiographic Data:   1: Distal abdominal aorta- widely patent 2: Right lower extremity- Russell right iliac system was widely patent 3: Left lower extremity- there was a 95% ulcerated plaque in Russell proximal left external iliac artery with visible thrombus/intraluminal filling defect.  There was mild disease in Russell mid left SFA with one-vessel runoff via Russell peroneal.  Russell peroneal filled to Russell AT and PT by geniculate collaterals  IMPRESSION: [name] has a high-grade left external iliac artery stenosis with a intravascular filling defect consistent with thrombus.  We will proceed with direct covered stenting.  Final Impression: Successful direct PTA and covered stenting of a high-grade proximal left external iliac artery stenosis with visible thrombus.  [name] does have one-vessel runoff via Russell peroneal.  He received 300 mg of p.o. Plavix.  Russell sheath was secured.  [name] left Russell lab in stable condition.  Sheath will be removed once Russell ACT falls below 170 and pressure held.  [name] be gently hydrated overnight and discharged home in Russell morning on dual antiplatelet therapy.  He will obtain lower extremity arterial Doppler studies in our [facility] line office next week and I will see him back 2 to 3 weeks thereafter.  He left Russell lab in stable  condition.  History of Present Illness     [name] was referred to [facility] by Dr. [name] for evaluation of PAD.  He has lifestyle limiting claudication on Russell left. Doppler studies that suggest a high-grade left iliac stenosis with monophasic waveforms and [name] wishes to proceed with invasive endovascular approach. He also has a history of CAD s/p LAD and RCA intervention in Russell past. Other problems include treated diabetes and hyperlipidemia.  He is on disability because of back disease and heart disease as well.  He is a retired [employer] administrator.  He had Dopplers performed 11/01/2017 revealing a right ABI of 1.04 and a left of 0.71 with a high-frequency signal in his left common iliac artery. Plans were made for peripheral angiography and potential endovascular therapy for lifestyle limiting claudication on 11/30/17.   Hospital Course     On 11/30/17 [name] was taken to Russell peripheral cath lab which revealed a 95% ulcerated plaque in Russell proximal left external iliac artery with visible thrombus/intraluminal filling defect. There was also mild disease in Russell mid left SFA with one-vessel runoff via Russell peroneal. Therefore, a successful direct PTA and covered stenting of a high-grade proximal left external iliac artery stenosis with visible thrombus was performed. He received 300 mg of PO Plavix. [name] be gently hydrated overnight and discharged home in Russell morning on dual antiplatelet therapy. He will obtain lower extremity arterial Doppler studies in [facility] line office next week and [name] will be seen by Dr. Gwenlyn Found in 2 to 3 weeks thereafter.    Medications: -Add ASA  81mg  daily, Plavix 75mg  daily  -Pt reports intolerance to statins   Creatinine on day of discharge, 1.42 with a baseline of 1.2. Will have BMET checked at follow up visit   Consultants: None    General: Obese, NAD Skin: Warm, dry, intact  Head: Normocephalic, atraumatic, clear, moist mucus  membranes. Neck: Negative for carotid bruits. No JVD Lungs:Clear to ausculation bilaterally. No wheezes, rales, or rhonchi. Breathing is unlabored. Cardiovascular: RRR with S1 S2. No murmurs, rubs, gallops, or LV heave appreciated. Abdomen: Soft, non-tender, non-distended with normoactive bowel sounds. No hepatomegaly, No rebound/guarding. No obvious abdominal masses. MSK: Strength and tone appear normal for age. 5/5 in all extremities Extremities: No edema. No clubbing or cyanosis. DP/PT pulses 2+ bilaterally. Left procedure site unremarkable  Neuro: Alert and oriented. No focal deficits. No facial asymmetry. MAE spontaneously. Psych: Responds to questions appropriately with normal affect.     [name] was seen and examined by Dr. Ellyn Hack who feels that he is stable and ready for discharge today, 12/01/17. Sites unremarkable. Lower extremity arterial doppler studies in [facility] office and will be seen by Dr. Gwenlyn Found thereafter.   Discharge Vitals Blood pressure 137/87, pulse 68, temperature 97.8 F (36.6 C), temperature source Oral, resp. rate 18, height 5\' 9"  (1.753 m), weight 106 kg, SpO2 97 %.  Filed Weights   11/30/17 0551  Weight: 106 kg   Labs & Radiologic Studies    CBC Recent Labs    11/28/17 1208 12/01/17 0336  WBC 9.7 11.4*  NEUTROABS 6.5  --   HGB 16.0 14.4  HCT 47.9 46.2  MCV 77* 81.2  PLT 248 174   Basic Metabolic Panel Recent Labs    11/28/17 1208 12/01/17 0336  NA 141 135  K 5.2 4.1  CL 102 102  CO2 23 25  GLUCOSE 163* 221*  BUN 18 17  CREATININE 1.32* 1.42*  CALCIUM 9.4 8.5*   Thyroid Function Tests Recent Labs    11/28/17 1208  TSH 2.590  _____________  [name] US Aorta/ivc/iliacs  Result Date: 11/02/2017 ABDOMINAL AORTA STUDY Indications: PVD Risk Factors: Hypertension, hyperlipidemia, Diabetes, current smoker, coronary               artery disease. Other Factors: [name] complains of pain in his left foot and left great toe for                 two months. Limitations: Air/bowel gas, obesity and [name] discomfort.  Comparison Study: On 10/25/17 right ABI was 1.04 and left ABI was 0.71. Arterial                   Duplex showed monophasic waveforms throughout Russell left left                   with an occluded left ATA. Performing Technologist: Jeneen Montgomery RDMS, RVT, RDCS  Examination Guidelines: A complete evaluation includes B-mode imaging, spectral Doppler, color Doppler, and power Doppler as needed of all accessible portions of each vessel. Bilateral testing is considered an integral part of a complete examination. Limited examinations for reoccurring indications may be performed as noted.  Abdominal Aorta Findings: +-------------+-------+----------+----------+----------+--------+--------+ Location     AP (cm)Trans (cm)PSV (cm/s)Waveform  ThrombusComments +-------------+-------+----------+----------+----------+--------+--------+ Proximal     2.23   2.32      85        biphasic                   +-------------+-------+----------+----------+----------+--------+--------+ Mid  2.03   1.97      45        biphasic                   +-------------+-------+----------+----------+----------+--------+--------+ Distal       1.40   1.80      51        biphasic                   +-------------+-------+----------+----------+----------+--------+--------+ RT CIA Prox  1.1    1.4       73        biphasic                   +-------------+-------+----------+----------+----------+--------+--------+ RT CIA Mid                    48        biphasic                   +-------------+-------+----------+----------+----------+--------+--------+ RT CIA Distal                 93        biphasic                   +-------------+-------+----------+----------+----------+--------+--------+ RT EIA Prox                   129       biphasic                    +-------------+-------+----------+----------+----------+--------+--------+ RT EIA Mid                    126       biphasic                   +-------------+-------+----------+----------+----------+--------+--------+ RT EIA Distal0.9    0.9       160       biphasic                   +-------------+-------+----------+----------+----------+--------+--------+ LT CIA Prox  1.2    1.3       112       monophasic                 +-------------+-------+----------+----------+----------+--------+--------+ LT CIA Mid                    44        monophasic                 +-------------+-------+----------+----------+----------+--------+--------+ LT CIA Distal                 465       monophasic                 +-------------+-------+----------+----------+----------+--------+--------+ LT EIA Prox                   26        monophasic                 +-------------+-------+----------+----------+----------+--------+--------+ LT EIA Mid                    38        monophasic                 +-------------+-------+----------+----------+----------+--------+--------+ LT EIA Distal0.8    0.9       38  monophasic                 +-------------+-------+----------+----------+----------+--------+--------+ IVC/Iliac Findings: +--------+------+--------+-------+         PatentThrombusComment +--------+------+--------+-------+ IVC Proxpatent                +--------+------+--------+-------+  Summary: Abdominal Aorta: No evidence of an abdominal aortic aneurysm was visualized. Russell largest aortic measurement is 2.3 cm. Stenosis: +-----------------+-------------+----------------------------------------------+ Location         Stenosis     Comments                                       +-----------------+-------------+----------------------------------------------+ Left Common Iliac>50% stenosisTotal occlusion cannot be ruled out based on                                  monophasic waveforms throughout Russell left leg.  +-----------------+-------------+----------------------------------------------+ IVC/Iliac: See Arterial Doppler and Arterial Duplex studies done 10-25-17.  *See table(s) above for measurements and observations. Vascular consult recommended.  Electronically signed by Jenkins Rouge MD on 11/02/2017 at 10:55:52 AM.    Final    Disposition   Pt is being discharged home today in good condition.  Follow-up Plans & Appointments   Follow-up Information    [facility] Follow up on 12/06/2017.   Specialty:  Cardiology Why:  Your follow up arterial doppler will be performed on 12/06/17 at 0930am.  Contact information: [address] [place] [phone]       Lorretta Harp, MD Follow up on 12/19/2017.   Specialties:  Cardiology, Radiology Why:  Your follow up appointment will be with Dr. Gwenlyn Found on 12/19/17 at 1130am.  Contact information: [address] [place] [address] [phone]           Discharge Medications   Allergies as of 12/01/2017   No Known Allergies     Medication List    STOP taking these medications   rosuvastatin 5 MG tablet Commonly known as:  CRESTOR     TAKE these medications   aspirin EC 81 MG tablet Take 81 mg by mouth daily.   clopidogrel 75 MG tablet Commonly known as:  PLAVIX Take 1 tablet (75 mg total) by mouth daily with breakfast.   FREESTYLE LIBRE READER Devi 1 packet by Does not apply route daily.   FREESTYLE LIBRE SENSOR SYSTEM Misc 1 Piece by Does not apply route continuous.   insulin glargine 100 UNIT/ML injection Commonly known as:  LANTUS Inject 20 Units into Russell skin at bedtime.   metFORMIN 1000 MG tablet Commonly known as:  GLUCOPHAGE Take 1 tablet (1,000 mg total) by mouth 2 (two) times daily with a meal. Do not restart Metformin until Wednesday am 7/2 What changed:  additional  instructions   nicotine 14 mg/24hr patch Commonly known as:  NICODERM CQ - dosed in mg/24 hours Place 1 patch (14 mg total) onto Russell skin daily.   nitroGLYCERIN 0.4 MG SL tablet Commonly known as:  NITROSTAT Place 1 tablet (0.4 mg total) under Russell tongue every 5 (five) minutes x 3 doses as needed for chest pain.        Acute coronary syndrome (MI, NSTEMI, STEMI, etc) this admission?: No.    Outstanding Labs/Studies   BMET to assess renal function  May need referral to lipid  clinic secondary to stain intolerance   Duration of Discharge Encounter   Greater than 30 minutes including physician time.  Signed, Kathyrn Drown, NP 12/01/2017, 8:49 AM

## 2017-12-01 NOTE — Care Management Note (Addendum)
Case Management Note  Patient Details  Name: [name] MRN: [id number] Date of Birth: May 12, 1955  Subjective/Objective:    From home, s/p LE angiography, will be on plavix.  He goes to [place] in [place], PCP is Sharion Balloon, fax [phone]. NCM will fax dc summary to PCP. DC sum faxed.               Action/Plan: DC home when ready.  Expected Discharge Date:  12/01/17               Expected Discharge Plan:  Home/Self Care  In-House Referral:     Discharge planning Services  CM Consult  Post Acute Care Choice:    Choice offered to:     DME Arranged:    DME Agency:     HH Arranged:    HH Agency:     Status of Service:  Completed, signed off  If discussed at [employer] of Stay Meetings, dates discussed:    Additional Comments:  Zenon Mayo, RN 12/01/2017, 9:44 AM

## 2017-12-02 ENCOUNTER — Emergency Department (HOSPITAL_COMMUNITY): Payer: Medicare Other

## 2017-12-02 ENCOUNTER — Emergency Department (HOSPITAL_COMMUNITY)
Admission: EM | Admit: 2017-12-02 | Discharge: 2017-12-02 | Disposition: A | Discharge status: Home / Self Care | Payer: Medicare Other | Attending: Emergency Medicine | Admitting: Emergency Medicine

## 2017-12-02 ENCOUNTER — Encounter (HOSPITAL_COMMUNITY): Payer: Self-pay | Admitting: *Deleted

## 2017-12-02 ENCOUNTER — Other Ambulatory Visit: Payer: Self-pay

## 2017-12-02 DIAGNOSIS — Z794 Long term (current) use of insulin: Secondary | ICD-10-CM | POA: Insufficient documentation

## 2017-12-02 DIAGNOSIS — N182 Chronic kidney disease, stage 2 (mild): Secondary | ICD-10-CM | POA: Diagnosis not present

## 2017-12-02 DIAGNOSIS — I252 Old myocardial infarction: Secondary | ICD-10-CM | POA: Diagnosis not present

## 2017-12-02 DIAGNOSIS — Z79899 Other long term (current) drug therapy: Secondary | ICD-10-CM | POA: Diagnosis not present

## 2017-12-02 DIAGNOSIS — I255 Ischemic cardiomyopathy: Secondary | ICD-10-CM | POA: Insufficient documentation

## 2017-12-02 DIAGNOSIS — Z87891 Personal history of nicotine dependence: Secondary | ICD-10-CM | POA: Diagnosis not present

## 2017-12-02 DIAGNOSIS — E1122 Type 2 diabetes mellitus with diabetic chronic kidney disease: Secondary | ICD-10-CM | POA: Insufficient documentation

## 2017-12-02 DIAGNOSIS — M5430 Sciatica, unspecified side: Secondary | ICD-10-CM

## 2017-12-02 DIAGNOSIS — I129 Hypertensive chronic kidney disease with stage 1 through stage 4 chronic kidney disease, or unspecified chronic kidney disease: Secondary | ICD-10-CM | POA: Insufficient documentation

## 2017-12-02 DIAGNOSIS — Z7902 Long term (current) use of antithrombotics/antiplatelets: Secondary | ICD-10-CM | POA: Diagnosis not present

## 2017-12-02 DIAGNOSIS — E785 Hyperlipidemia, unspecified: Secondary | ICD-10-CM | POA: Insufficient documentation

## 2017-12-02 DIAGNOSIS — M79605 Pain in left leg: Secondary | ICD-10-CM | POA: Diagnosis not present

## 2017-12-02 DIAGNOSIS — Z7982 Long term (current) use of aspirin: Secondary | ICD-10-CM | POA: Insufficient documentation

## 2017-12-02 DIAGNOSIS — M5432 Sciatica, left side: Secondary | ICD-10-CM | POA: Diagnosis not present

## 2017-12-02 DIAGNOSIS — I2510 Atherosclerotic heart disease of native coronary artery without angina pectoris: Secondary | ICD-10-CM | POA: Insufficient documentation

## 2017-12-02 MED ORDER — OXYCODONE-ACETAMINOPHEN 5-325 MG PO TABS
2.0000 | ORAL_TABLET | Freq: Once | ORAL | Status: AC
  Administered 2017-12-02: 2 via ORAL
  Filled 2017-12-02: qty 2

## 2017-12-02 MED ORDER — NICOTINE 14 MG/24HR TD PT24
14.0000 mg | MEDICATED_PATCH | Freq: Once | TRANSDERMAL | Status: DC
  Administered 2017-12-02: 14 mg via TRANSDERMAL
  Filled 2017-12-02: qty 1

## 2017-12-02 MED ORDER — HYDROCODONE-ACETAMINOPHEN 5-325 MG PO TABS: 1.0000 | ORAL_TABLET | Freq: Four times a day (QID) | ORAL | 0 refills | Status: DC | PRN

## 2017-12-02 MED ORDER — NICOTINE 14 MG/24HR TD PT24: 14.0000 mg | MEDICATED_PATCH | Freq: Every day | TRANSDERMAL | 0 refills | Status: DC

## 2017-12-02 NOTE — Discharge Instructions (Addendum)
Follow-up with your doctor as planned Wednesday.  If you are getting worse you need to contact them sooner

## 2017-12-02 NOTE — ED Provider Notes (Signed)
[facility] EMERGENCY DEPARTMENT Provider Note   CSN: [id number] Arrival date & time: 12/02/17  1953     History   Chief Complaint Chief Complaint  Patient presents with  . Leg Pain    pain from recent surgery 11/30/17 Iliac stent placement left leg    HPI [name] is a 62 y.o. male.  Patient complains of pain in his left thigh.  Some pain in his foot.  He has history of sciatica on that left side but also had a stent placed within the last couple weeks in his left leg.  The history is provided by the patient. No language interpreter was used.  Leg Pain   This is a recurrent problem. The current episode started more than 2 days ago. The problem occurs constantly. The problem has not changed since onset.Pain location: Left thigh. The quality of the pain is described as aching. The pain is at a severity of 2/10. The pain is mild. Pertinent negatives include full range of motion. Exacerbated by: Unknown. He has tried nothing for the symptoms. The treatment provided no relief. There has been no history of extremity trauma.    Past Medical History:  Diagnosis Date  . Bulging lumbar disc   . CAD (coronary artery disease) [id number]   a. prior LAD stenting. b. s/p DES to [facility] 08/2015 @ [facility]. c. 04/2016 Cardiac cath at [facility]. # vessel CAD, with patent stent in the PLAD and RCA. Diffuse dLAD, OM2, and  RPDA disease. No aortic stenosis. Elevated LVEDP. No lesion to suggest ACS. Continue medical managment.  . Chronic lower back pain   . CKD (chronic kidney disease), stage II   . Cocaine abuse ([place])   . DM2 (diabetes mellitus, type 2) ([place])   . GERD (gastroesophageal reflux disease)   . Headache    "often; not regular" (11/30/2017)  . HTN (hypertension)   . Hyperlipidemia   . Ischemic cardiomyopathy   . MI, old   . Obesity   . Pneumonia    hx  . Sleep apnea    "have machine; I don't use it" (11/30/2017)  . Tobacco use     Patient Active Problem List   Diagnosis Date Noted  . Claudication in peripheral vascular disease ([place]) 11/30/2017  . Peripheral arterial disease ([place]) 11/28/2017  . Chest pain 11/03/2016  . Constipation 11/03/2016  . CKD (chronic kidney disease), stage II 08/02/2016  . Cellulitis of right hand 08/02/2016  . Chronic pain 08/02/2016  . AKI (acute kidney injury) ([place]) 01/31/2016  . Lobar pneumonia ([place]) 01/31/2016  . Influenza A 01/31/2016  . Diarrhea 01/30/2016  . NSVT (nonsustained ventricular tachycardia) ([place]) 09/12/2015  . Precordial chest pain 09/10/2015  . Cocaine use 09/04/2015  . Near syncope 06/29/2015  . Essential hypertension 06/29/2015  . History of heart artery stents 06/29/2015  . Insulin dependent diabetes mellitus ([place]) 06/29/2015  . History of MI (myocardial infarction) 06/29/2015  . Chronic systolic CHF 11/03/8525  . Musculoskeletal chest pain 05/27/2015  . OSA on CPAP 01/21/2015  . Rectal bleeding   . Stage 3 chronic renal impairment associated with type 2 diabetes mellitus ([place]) 09/09/2013  . 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 CAD (coronary artery disease) 09/09/2013  . Cardiomyopathy, ischemic-EF 40-45% by echo 09/07/13 09/09/2013  . S/P LAD DES June 2014 09/06/2013  . Tobacco use disorder 09/06/2013  . Hyperglycemia 08/22/2012  . NSTEMI (non-ST elevated myocardial infarction) ([place]) 07/07/2012  . Dyslipidemia 07/07/2012  .  Hypertensive heart disease     Past Surgical History:  Procedure Laterality Date  . APPENDECTOMY    . CARDIAC CATHETERIZATION N/A 09/07/2015   Procedure: Left Heart Cath and Coronary Angiography;  Surgeon: Leonie Man, MD;  Location: [place] CV LAB;  Service: Cardiovascular;  Laterality: N/A;  . CARDIAC CATHETERIZATION N/A 09/07/2015   Procedure: Coronary Stent Intervention;  Surgeon: Leonie Man, MD;  Location: [place] CV LAB;  Service: Cardiovascular;  Laterality: N/A;  . CORONARY ANGIOGRAM  09/07/13   residual RCA and OM disease   . CORONARY ANGIOPLASTY WITH STENT PLACEMENT    . FRACTURE SURGERY    . INSERTION OF ILIAC STENT Left 11/30/2017   Left external illiac stent  . INSERTION OF ILIAC STENT  11/30/2017   Procedure: Insertion Of Iliac Stent;  Surgeon: Lorretta Harp, MD;  Location: [place] CV LAB;  Service: Cardiovascular;;  Left external illiac stent  . KNEE ARTHROSCOPY Left   . KNEE SURGERY     "ligaments, cartilage; tendon, put a pin in" (11/30/2017)  . LEFT HEART CATH Bilateral 07/08/2012   Procedure: LEFT HEART CATH;  Surgeon: [name], MD;  Location: [facility] CATH LAB;  Service: Cardiovascular;  Laterality: Bilateral;  . LEFT HEART CATHETERIZATION WITH CORONARY ANGIOGRAM N/A 09/06/2013   STEMI, 2nd ISR LAD. Procedure: LEFT HEART CATHETERIZATION WITH CORONARY ANGIOGRAM;  Surgeon: [name], MD;  Location: [facility] CATH LAB;  Service: Cardiovascular;  Laterality: N/A;  . LOWER EXTREMITY ANGIOGRAPHY N/A 11/30/2017   Procedure: LOWER EXTREMITY ANGIOGRAPHY;  Surgeon: Lorretta Harp, MD;  Location: [place] CV LAB;  Service: Cardiovascular;  Laterality: N/A;  . PERCUTANEOUS CORONARY STENT INTERVENTION (PCI-S)  07/08/2012   Procedure: PERCUTANEOUS CORONARY STENT INTERVENTION (PCI-S);  Surgeon: [name], MD;  Location: [facility] CATH LAB;  Service: Cardiovascular;;  DES LAD  . PERCUTANEOUS CORONARY STENT INTERVENTION (PCI-S) N/A 09/06/2013   Procedure: PERCUTANEOUS CORONARY STENT INTERVENTION (PCI-S);  Surgeon: [name], MD;  Location: [facility] CATH LAB;  Service: Cardiovascular;  Laterality: N/A;  Mid LAD 3.0/24mm Promus  . WRIST FRACTURE SURGERY Bilateral         Home Medications    Prior to Admission medications   Medication Sig Start Date End Date Taking? Authorizing Provider  aspirin EC 81 MG tablet Take 81 mg by mouth daily.     [provider]  clopidogrel (PLAVIX) 75 MG tablet Take 1 tablet (75 mg total) by mouth daily with breakfast. 12/01/17   Tommie Raymond,  NP  Continuous Blood Gluc Receiver (FREESTYLE LIBRE READER) DEVI 1 packet by Does not apply route daily. 08/04/16   Rexene Alberts, MD  Continuous Blood Gluc Sensor (Liberal) MISC 1 Piece by Does not apply route continuous. 08/04/16   Rexene Alberts, MD  HYDROcodone-acetaminophen (NORCO/VICODIN) 5-325 MG tablet Take 1 tablet by mouth every 6 (six) hours as needed for moderate pain. 12/02/17   Milton Ferguson, MD  insulin glargine (LANTUS) 100 UNIT/ML injection Inject 20 Units into the skin at bedtime.     [provider]  metFORMIN (GLUCOPHAGE) 1000 MG tablet Take 1 tablet (1,000 mg total) by mouth 2 (two) times daily with a meal. Do not restart Metformin until Wednesday am 7/2 Patient taking differently: Take 1,000 mg by mouth 2 (two) times daily with a meal.  07/10/12   Turner, Eber Hong, MD  nicotine (NICODERM CQ - DOSED IN MG/24 HOURS) 14 mg/24hr patch Place 1 patch (14 mg total) onto  the skin daily. 12/01/17   Tommie Raymond, NP  nitroGLYCERIN (NITROSTAT) 0.4 MG SL tablet Place 1 tablet (0.4 mg total) under the tongue every 5 (five) minutes x 3 doses as needed for chest pain. 05/03/14   Thurnell Lose, MD    Family History Family History  Problem Relation Age of Onset  . Hypertension Mother   . Diabetes Mother     Social History Social History   Tobacco Use  . Smoking status: Former Smoker    Packs/day: 0.50    Years: 48.00    Pack years: 24.00    Types: Cigarettes  . Smokeless tobacco: Never Used  Substance Use Topics  . Alcohol use: Yes    Comment: 11/30/2017 "might drink a beer q 6 months"  . Drug use: No    Types: Cocaine    Comment: 11/30/2017 "last cocaine was ~ 1 wk ago"     Allergies   Patient has no known allergies.   Review of Systems Review of Systems  Constitutional: Negative for appetite change and fatigue.  HENT: Negative for congestion, ear discharge and sinus pressure.   Eyes: Negative for discharge.  Respiratory: Negative  for cough.   Cardiovascular: Negative for chest pain.  Gastrointestinal: Negative for abdominal pain and diarrhea.  Genitourinary: Negative for frequency and hematuria.  Musculoskeletal: Negative for back pain.       Left leg pain  Skin: Negative for rash.  Neurological: Negative for seizures and headaches.  Psychiatric/Behavioral: Negative for hallucinations.     Physical Exam Updated Vital Signs BP 121/72 (BP Location: Right Arm)   Pulse 71   Temp 97.8 F (36.6 C) (Oral)   Resp 15   Ht 5\' 9"  (1.753 m)   Wt 106.6 kg   SpO2 94%   BMI 34.70 kg/m   Physical Exam  Constitutional: He is oriented to person, place, and time. He appears well-developed.  HENT:  Head: Normocephalic.  Eyes: Conjunctivae and EOM are normal. No scleral icterus.  Neck: Neck supple. No thyromegaly present.  Cardiovascular: Normal rate and regular rhythm. Exam reveals no gallop and no friction rub.  No murmur heard. Pulmonary/Chest: No stridor. He has no wheezes. He has no rales. He exhibits no tenderness.  Abdominal: He exhibits no distension. There is no tenderness. There is no rebound.  Musculoskeletal: Normal range of motion. He exhibits no edema.  Patient has a 2+ femoral pulse and 2+ dorsalis pedis pulse on the left.  Full range of motion  Lymphadenopathy:    He has no cervical adenopathy.  Neurological: He is oriented to person, place, and time. He exhibits normal muscle tone. Coordination normal.  Patient has a positive straight leg raise on the left  Skin: No rash noted. No erythema.  Psychiatric: He has a normal mood and affect. His behavior is normal.     ED Treatments / Results  Labs (all labs ordered are listed, but only abnormal results are displayed) Labs Reviewed - No data to display  EKG None  Radiology Dg Lumbar Spine Complete  Result Date: 12/02/2017 CLINICAL DATA:  Increasing left leg pain. Decreased ability to walk. Recent iliac stent placement procedure on 11/30/2017  EXAM: LUMBAR SPINE - COMPLETE 4+ VIEW COMPARISON:  CT abdomen and pelvis 11/24/2016 FINDINGS: Normal alignment of the lumbar vertebrae. Mild degenerative changes with narrowed interspaces and endplate hypertrophic changes most prominent at L4-5 and L5-S1 levels. Degenerative changes in the facet joints. No vertebral compression deformities. No focal bone lesion or bone  destruction. Vascular calcifications. Vascular stent projected over the left iliac region. IMPRESSION: Mild degenerative changes in the lumbar spine. Normal alignment. No acute displaced fractures identified. Aortic atherosclerosis. Electronically Signed   By: Lucienne Capers M.D.   On: 12/02/2017 21:21    Procedures Procedures (including critical care time)  Medications Ordered in ED Medications  oxyCODONE-acetaminophen (PERCOCET/ROXICET) 5-325 MG per tablet 2 tablet (2 tablets Oral Given 12/02/17 2034)     Initial Impression / Assessment and Plan / ED Course  I have reviewed the triage vital signs and the nursing notes.  Pertinent labs & imaging results that were available during my care of the patient were reviewed by me and considered in my medical decision making (see chart for details).    Patient was given some Percocet to help with his discomfort.  Suspect musculoskeletal discomfort and sciatica.  Patient given some hydro-codon and will follow-up Wednesday with his doctor  Final Clinical Impressions(s) / ED Diagnoses   Final diagnoses:  Left leg pain  Sciatica, unspecified laterality    ED Discharge Orders         Ordered    HYDROcodone-acetaminophen (NORCO/VICODIN) 5-325 MG tablet  Every 6 hours PRN     12/02/17 2131           Milton Ferguson, MD 12/02/17 2136

## 2017-12-02 NOTE — ED Triage Notes (Signed)
Patient reports increased left leg pain and decreased ability to walk due to recent Iliac stent placement procedure on 11/30/17.

## 2017-12-02 NOTE — ED Notes (Signed)
Patient transported to radiology

## 2017-12-05 ENCOUNTER — Ambulatory Visit (INDEPENDENT_AMBULATORY_CARE_PROVIDER_SITE_OTHER): Payer: Non-veteran care | Admitting: Podiatry

## 2017-12-05 ENCOUNTER — Encounter: Payer: Self-pay | Admitting: Podiatry

## 2017-12-05 DIAGNOSIS — E1142 Type 2 diabetes mellitus with diabetic polyneuropathy: Secondary | ICD-10-CM

## 2017-12-05 DIAGNOSIS — E1151 Type 2 diabetes mellitus with diabetic peripheral angiopathy without gangrene: Secondary | ICD-10-CM

## 2017-12-05 DIAGNOSIS — M79676 Pain in unspecified toe(s): Secondary | ICD-10-CM

## 2017-12-05 DIAGNOSIS — B351 Tinea unguium: Secondary | ICD-10-CM

## 2017-12-05 NOTE — Patient Instructions (Signed)
Diabetes and Foot Care Diabetes may cause you to have problems because of poor blood supply (circulation) to your feet and legs. This may cause the skin on your feet to become thinner, break easier, and heal more slowly. Your skin may become dry, and the skin may peel and crack. You may also have nerve damage in your legs and feet causing decreased feeling in them. You may not notice minor injuries to your feet that could lead to infections or more serious problems. Taking care of your feet is one of the most important things you can do for yourself. Follow these instructions at home:  Wear shoes at all times, even in the house. Do not go barefoot. Bare feet are easily injured.  Check your feet daily for blisters, cuts, and redness. If you cannot see the bottom of your feet, use a mirror or ask someone for help.  Wash your feet with warm water (do not use hot water) and mild soap. Then pat your feet and the areas between your toes until they are completely dry. Do not soak your feet as this can dry your skin.  Apply a moisturizing lotion or petroleum jelly (that does not contain alcohol and is unscented) to the skin on your feet and to dry, brittle toenails. Do not apply lotion between your toes.  Trim your toenails straight across. Do not dig under them or around the cuticle. File the edges of your nails with an emery board or nail file.  Do not cut corns or calluses or try to remove them with medicine.  Wear clean socks or stockings every day. Make sure they are not too tight. Do not wear knee-high stockings since they may decrease blood flow to your legs.  Wear shoes that fit properly and have enough cushioning. To break in new shoes, wear them for just a few hours a day. This prevents you from injuring your feet. Always look in your shoes before you put them on to be sure there are no objects inside.  Do not cross your legs. This may decrease the blood flow to your feet.  If you find a  minor scrape, cut, or break in the skin on your feet, keep it and the skin around it clean and dry. These areas may be cleansed with mild soap and water. Do not cleanse the area with peroxide, alcohol, or iodine.  When you remove an adhesive bandage, be sure not to damage the skin around it.  If you have a wound, look at it several times a day to make sure it is healing.  Do not use heating pads or hot water bottles. They may burn your skin. If you have lost feeling in your feet or legs, you may not know it is happening until it is too late.  Make sure your health care provider performs a complete foot exam at least annually or more often if you have foot problems. Report any cuts, sores, or bruises to your health care provider immediately. Contact a health care provider if:  You have an injury that is not healing.  You have cuts or breaks in the skin.  You have an ingrown nail.  You notice redness on your legs or feet.  You feel burning or tingling in your legs or feet.  You have pain or cramps in your legs and feet.  Your legs or feet are numb.  Your feet always feel cold. Get help right away if:  There is increasing   redness, swelling, or pain in or around a wound.  There is a red line that goes up your leg.  Pus is coming from a wound.  You develop a fever or as directed by your health care provider.  You notice a bad smell coming from an ulcer or wound. This information is not intended to replace advice given to you by your health care provider. Make sure you discuss any questions you have with your health care provider. Document Released: 12/25/1999 Document Revised: 06/04/2015 Document Reviewed: 06/05/2012 Elsevier Interactive Patient Education  2017 [employer]  Diabetic Neuropathy Diabetic neuropathy is a nerve disease or nerve damage that is caused by diabetes mellitus. About half of all people with diabetes mellitus have some form of nerve damage. Nerve damage  is more common in those who have had diabetes mellitus for many years and who generally have not had good control of their blood sugar (glucose) level. Diabetic neuropathy is a common complication of diabetes mellitus. There are three common types of diabetic neuropathy and a fourth type that is less common and less understood:  Peripheral neuropathy-This is the most common type of diabetic neuropathy. It causes damage to the nerves of the feet and legs first and then eventually the hands and arms. The damage affects the ability to sense touch.  Autonomic neuropathy-This type causes damage to the autonomic nervous system, which controls the following functions: ? Heartbeat. ? Body temperature. ? Blood pressure. ? Urination. ? Digestion. ? Sweating. ? Sexual function.  Focal neuropathy-Focal neuropathy can be painful and unpredictable and occurs most often in older adults with diabetes mellitus. It involves a specific nerve or one area and often comes on suddenly. It usually does not cause long-term problems.  Radiculoplexus neuropathy- Sometimes called lumbosacral radiculoplexus neuropathy, radiculoplexus neuropathy affects the nerves of the thighs, hips, buttocks, or legs. It is more common in people with type 2 diabetes mellitus and in older men. It is characterized by debilitating pain, weakness, and atrophy, usually in the thigh muscles.  What are the causes? The cause of peripheral, autonomic, and focal neuropathies is diabetes mellitus that is uncontrolled and high glucose levels. The cause of radiculoplexus neuropathy is unknown. However, it is thought to be caused by inflammation related to uncontrolled glucose levels. What are the signs or symptoms? Peripheral Neuropathy Peripheral neuropathy develops slowly over time. When the nerves of the feet and legs no longer work there may be:  Burning, stabbing, or aching pain in the legs or feet.  Inability to feel pressure or pain in your  feet. This can lead to: ? Thick calluses over pressure areas. ? Pressure sores. ? Ulcers.  Foot deformities.  Reduced ability to feel temperature changes.  Muscle weakness.  Autonomic Neuropathy The symptoms of autonomic neuropathy vary depending on which nerves are affected. Symptoms may include:  Problems with digestion, such as: ? Feeling sick to your stomach (nausea). ? Vomiting. ? Bloating. ? Constipation. ? Diarrhea. ? Abdominal pain.  Difficulty with urination. This occurs if you lose your ability to sense when your bladder is full. Problems include: ? Urine leakage (incontinence). ? Inability to empty your bladder completely (retention).  Rapid or irregular heartbeat (palpitations).  Blood pressure drops when you stand up (orthostatic hypotension). When you stand up you may feel: ? Dizzy. ? Weak. ? Faint.  In men, inability to attain and maintain an erection.  In women, vaginal dryness and problems with decreased sexual desire and arousal.  Problems with body temperature  regulation.  Increased or decreased sweating.  Focal Neuropathy  Abnormal eye movements or abnormal alignment of both eyes.  Weakness in the wrist.  Foot drop. This results in an inability to lift the foot properly and abnormal walking or foot movement.  Paralysis on one side of your face (Bell palsy).  Chest or abdominal pain. Radiculoplexus Neuropathy  Sudden, severe pain in your hip, thigh, or buttocks.  Weakness and wasting of thigh muscles.  Difficulty rising from a seated position.  Abdominal swelling.  Unexplained weight loss (usually more than 10 lb [4.5 kg]). How is this diagnosed? Peripheral Neuropathy Your senses may be tested. Sensory function testing can be done with:  A light touch using a monofilament.  A vibration with tuning fork.  A sharp sensation with a pin prick.  Other tests that can help diagnose neuropathy are:  Nerve conduction velocity. This  test checks the transmission of an electrical current through a nerve.  Electromyography. This shows how muscles respond to electrical signals transmitted by nearby nerves.  Quantitative sensory testing. This is used to assess how your nerves respond to vibrations and changes in temperature.  Autonomic Neuropathy Diagnosis is often based on reported symptoms. Tell your health care provider if you experience:  Dizziness.  Constipation.  Diarrhea.  Inappropriate urination or inability to urinate.  Inability to get or maintain an erection.  Tests that may be done include:  Electrocardiography or Holter monitor. These are tests that can help show problems with the heart rate or heart rhythm.  An X-ray exam may be done.  Focal Neuropathy Diagnosis is made based on your symptoms and what your health care provider finds during your exam. Other tests may be done. They may include:  Nerve conduction velocities. This checks the transmission of electrical current through a nerve.  Electromyography. This shows how muscles respond to electrical signals transmitted by nearby nerves.  Quantitative sensory testing. This test is used to assess how your nerves respond to vibration and changes in temperature.  Radiculoplexus Neuropathy  Often the first thing is to eliminate any other issue or problems that might be the cause, as there is no standard test for diagnosis.  X-ray exam of your spine and lumbar region.  Spinal tap to rule out cancer.  MRI to rule out other lesions. How is this treated? Once nerve damage occurs, it cannot be reversed. The goal of treatment is to keep the disease or nerve damage from getting worse and affecting more nerve fibers. Controlling your blood glucose level is the key. Most people with radiculoplexus neuropathy see at least a partial improvement over time. You will need to keep your blood glucose and HbA1c levels in the target range determined by your  health care provider. Things that help control blood glucose levels include:  Blood glucose monitoring.  Meal planning.  Physical activity.  Diabetes medicine.  Over time, maintaining lower blood glucose levels helps lessen symptoms. Sometimes, prescription pain medicine is needed. Follow these instructions at home:  Do not smoke.  Keep your blood glucose level in the range that you and your health care provider have determined acceptable for you.  Keep your blood pressure level in the range that you and your health care provider have determined acceptable for you.  Eat a well-balanced diet.  Be physically active every day. Include strength training and balance exercises.  Protect your feet. ? Check your feet every day for sores, cuts, blisters, or signs of infection. ? Wear padded socks   and supportive shoes. Use orthotic inserts, if necessary. ? Regularly check the insides of your shoes for worn spots. Make sure there are no rocks or other items inside your shoes before you put them on. Contact a health care provider if:  You have burning, stabbing, or aching pain in the legs or feet.  You are unable to feel pressure or pain in your feet.  You develop problems with digestion such as: ? Nausea. ? Vomiting. ? Bloating. ? Constipation. ? Diarrhea. ? Abdominal pain.  You have difficulty with urination, such as: ? Incontinence. ? Retention.  You have palpitations.  You develop orthostatic hypotension. When you stand up you may feel: ? Dizzy. ? Weak. ? Faint.  You cannot attain and maintain an erection (in men).  You have vaginal dryness and problems with decreased sexual desire and arousal (in women).  You have severe pain in your thighs, legs, or buttocks.  You have unexplained weight loss. This information is not intended to replace advice given to you by your health care provider. Make sure you discuss any questions you have with your health care  provider. Document Released: 03/07/2001 Document Revised: 06/04/2015 Document Reviewed: 06/07/2012 Elsevier Interactive Patient Education  2017 [place]? An infection that lies within the keratin of your nail plate that is caused by a fungus.  WHY ME? Fungal infections affect all ages, sexes, races, and creeds.  There may be many factors that predispose you to a fungal infection such as age, coexisting medical conditions such as diabetes, or an autoimmune disease; stress, medications, fatigue, genetics, etc.  Bottom line: fungus thrives in a warm, moist environment and your shoes offer such a location.  IS IT CONTAGIOUS? Theoretically, yes.  You do not want to share shoes, nail clippers or files with someone who has fungal toenails.  Walking around barefoot in the same room or sleeping in the same bed is unlikely to transfer the organism.  It is important to realize, however, that fungus can spread easily from one nail to the next on the same foot.  HOW DO WE TREAT THIS?  There are several ways to treat this condition.  Treatment may depend on many factors such as age, medications, pregnancy, liver and kidney conditions, etc.  It is best to ask your doctor which options are available to you.  1. No treatment.   Unlike many other medical concerns, you can live with this condition.  However for many people this can be a painful condition and may lead to ingrown toenails or a bacterial infection.  It is recommended that you keep the nails cut short to help reduce the amount of fungal nail. 2. Topical treatment.  These range from herbal remedies to prescription strength nail lacquers.  About 40-50% effective, topicals require twice daily application for approximately 9 to 12 months or until an entirely new nail has grown out.  The most effective topicals are medical grade medications available through physicians offices. 3. Oral antifungal medications.  With  an 80-90% cure rate, the most common oral medication requires 3 to 4 months of therapy and stays in your system for a year as the new nail grows out.  Oral antifungal medications do require blood work to make sure it is a safe drug for you.  A liver function panel will be performed prior to starting the medication and after the first month of treatment.  It is important to have the blood work performed to  avoid any harmful side effects.  In general, this medication safe but blood work is required. 4. Laser Therapy.  This treatment is performed by applying a specialized laser to the affected nail plate.  This therapy is noninvasive, fast, and non-painful.  It is not covered by insurance and is therefore, out of pocket.  The results have been very good with a 80-95% cure rate.  [facility] is the only practice in the area to offer this therapy. 5. Permanent Nail Avulsion.  Removing the entire nail so that a new nail will not grow back.

## 2017-12-06 ENCOUNTER — Ambulatory Visit (HOSPITAL_COMMUNITY)
Admission: RE | Admit: 2017-12-06 | Discharge: 2017-12-06 | Disposition: A | Discharge status: Home / Self Care | Payer: No Typology Code available for payment source | Source: Ambulatory Visit | Attending: Cardiology | Admitting: Cardiology

## 2017-12-06 ENCOUNTER — Encounter: Payer: Self-pay | Admitting: Cardiovascular Disease

## 2017-12-06 ENCOUNTER — Ambulatory Visit (HOSPITAL_BASED_OUTPATIENT_CLINIC_OR_DEPARTMENT_OTHER)
Admission: RE | Admit: 2017-12-06 | Discharge: 2017-12-06 | Disposition: A | Discharge status: Home / Self Care | Payer: No Typology Code available for payment source | Source: Ambulatory Visit | Attending: Cardiology | Admitting: Cardiology

## 2017-12-06 DIAGNOSIS — I739 Peripheral vascular disease, unspecified: Secondary | ICD-10-CM | POA: Diagnosis present

## 2017-12-06 DIAGNOSIS — Z95820 Peripheral vascular angioplasty status with implants and grafts: Secondary | ICD-10-CM | POA: Diagnosis present

## 2017-12-09 ENCOUNTER — Other Ambulatory Visit: Payer: Self-pay

## 2017-12-09 ENCOUNTER — Emergency Department (HOSPITAL_COMMUNITY)
Admission: EM | Admit: 2017-12-09 | Discharge: 2017-12-09 | Disposition: A | Discharge status: Home / Self Care | Payer: Medicare Other | Attending: Emergency Medicine | Admitting: Emergency Medicine

## 2017-12-09 ENCOUNTER — Encounter (HOSPITAL_COMMUNITY): Payer: Self-pay | Admitting: Emergency Medicine

## 2017-12-09 DIAGNOSIS — I130 Hypertensive heart and chronic kidney disease with heart failure and stage 1 through stage 4 chronic kidney disease, or unspecified chronic kidney disease: Secondary | ICD-10-CM | POA: Insufficient documentation

## 2017-12-09 DIAGNOSIS — T8189XA Other complications of procedures, not elsewhere classified, initial encounter: Secondary | ICD-10-CM | POA: Diagnosis not present

## 2017-12-09 DIAGNOSIS — Z5189 Encounter for other specified aftercare: Secondary | ICD-10-CM

## 2017-12-09 DIAGNOSIS — Z794 Long term (current) use of insulin: Secondary | ICD-10-CM | POA: Insufficient documentation

## 2017-12-09 DIAGNOSIS — I2510 Atherosclerotic heart disease of native coronary artery without angina pectoris: Secondary | ICD-10-CM | POA: Diagnosis not present

## 2017-12-09 DIAGNOSIS — E1122 Type 2 diabetes mellitus with diabetic chronic kidney disease: Secondary | ICD-10-CM | POA: Insufficient documentation

## 2017-12-09 DIAGNOSIS — N182 Chronic kidney disease, stage 2 (mild): Secondary | ICD-10-CM | POA: Insufficient documentation

## 2017-12-09 DIAGNOSIS — Z7982 Long term (current) use of aspirin: Secondary | ICD-10-CM | POA: Diagnosis not present

## 2017-12-09 DIAGNOSIS — Z4801 Encounter for change or removal of surgical wound dressing: Secondary | ICD-10-CM | POA: Insufficient documentation

## 2017-12-09 DIAGNOSIS — Z87891 Personal history of nicotine dependence: Secondary | ICD-10-CM | POA: Diagnosis not present

## 2017-12-09 DIAGNOSIS — Y69 Unspecified misadventure during surgical and medical care: Secondary | ICD-10-CM | POA: Insufficient documentation

## 2017-12-09 DIAGNOSIS — I5022 Chronic systolic (congestive) heart failure: Secondary | ICD-10-CM | POA: Diagnosis not present

## 2017-12-09 DIAGNOSIS — Z79899 Other long term (current) drug therapy: Secondary | ICD-10-CM | POA: Diagnosis not present

## 2017-12-09 MED ORDER — NICOTINE 14 MG/24HR TD PT24
14.0000 mg | MEDICATED_PATCH | Freq: Once | TRANSDERMAL | Status: DC
  Administered 2017-12-09: 14 mg via TRANSDERMAL
  Filled 2017-12-09: qty 1

## 2017-12-09 MED ORDER — CLINDAMYCIN HCL 300 MG PO CAPS: 300.0000 mg | ORAL_CAPSULE | Freq: Four times a day (QID) | ORAL | 0 refills | Status: DC

## 2017-12-09 MED ORDER — HYDROCODONE-ACETAMINOPHEN 5-325 MG PO TABS: 1.0000 | ORAL_TABLET | Freq: Four times a day (QID) | ORAL | 0 refills | Status: DC | PRN

## 2017-12-09 MED ORDER — HYDROCODONE-ACETAMINOPHEN 5-325 MG PO TABS
1.0000 | ORAL_TABLET | Freq: Once | ORAL | Status: AC
  Administered 2017-12-09: 1 via ORAL
  Filled 2017-12-09: qty 1

## 2017-12-09 MED ORDER — CLINDAMYCIN HCL 150 MG PO CAPS
300.0000 mg | ORAL_CAPSULE | Freq: Once | ORAL | Status: AC
  Administered 2017-12-09: 300 mg via ORAL
  Filled 2017-12-09: qty 2

## 2017-12-09 NOTE — ED Provider Notes (Signed)
[facility] EMERGENCY DEPARTMENT Provider Note   CSN: [id number] Arrival date & time: 12/09/17  1016     History   Chief Complaint Chief Complaint  Patient presents with  . Post-op Problem    HPI [name] is a 62 y.o. male.  HPI Patient had percutaneous left iliac stent placed 9 days ago.  States of the last 2 days he has had some drainage from the insertion site in the left side of the groin.  Also notes pain at the site.  Denies any fever or chills.  No lower extremity pain. Past Medical History:  Diagnosis Date  . Bulging lumbar disc   . CAD (coronary artery disease) [phone]   a. prior LAD stenting. b. s/p DES to [facility] 08/2015 @ [facility]. c. 04/2016 Cardiac cath at [facility]. # vessel CAD, with patent stent in the PLAD and RCA. Diffuse dLAD, OM2, and  RPDA disease. No aortic stenosis. Elevated LVEDP. No lesion to suggest ACS. Continue medical managment.  . Chronic lower back pain   . CKD (chronic kidney disease), stage II   . Cocaine abuse ([place])   . DM2 (diabetes mellitus, type 2) ([place])   . GERD (gastroesophageal reflux disease)   . Headache    "often; not regular" (11/30/2017)  . HTN (hypertension)   . Hyperlipidemia   . Ischemic cardiomyopathy   . MI, old   . Obesity   . Pneumonia    hx  . Sleep apnea    "have machine; I don't use it" (11/30/2017)  . Tobacco use     Patient Active Problem List   Diagnosis Date Noted  . Claudication in peripheral vascular disease ([place]) 11/30/2017  . Peripheral arterial disease ([place]) 11/28/2017  . Chest pain 11/03/2016  . Constipation 11/03/2016  . CKD (chronic kidney disease), stage II 08/02/2016  . Cellulitis of right hand 08/02/2016  . Chronic pain 08/02/2016  . AKI (acute kidney injury) ([place]) 01/31/2016  . Lobar pneumonia ([place]) 01/31/2016  . Influenza A 01/31/2016  . Diarrhea 01/30/2016  . NSVT (nonsustained ventricular tachycardia) ([place]) 09/12/2015  . Precordial chest pain 09/10/2015  . Cocaine use  09/04/2015  . Near syncope 06/29/2015  . Essential hypertension 06/29/2015  . History of heart artery stents 06/29/2015  . Insulin dependent diabetes mellitus ([place]) 06/29/2015  . History of MI (myocardial infarction) 06/29/2015  . Chronic systolic CHF 88/50/2774  . Musculoskeletal chest pain 05/27/2015  . OSA on CPAP 01/21/2015  . Rectal bleeding   . Stage 3 chronic renal impairment associated with type 2 diabetes mellitus ([place]) 09/09/2013  . 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 CAD (coronary artery disease) 09/09/2013  . Cardiomyopathy, ischemic-EF 40-45% by echo 09/07/13 09/09/2013  . S/P LAD DES June 2014 09/06/2013  . Tobacco use disorder 09/06/2013  . Hyperglycemia 08/22/2012  . NSTEMI (non-ST elevated myocardial infarction) ([place]) 07/07/2012  . Dyslipidemia 07/07/2012  . Hypertensive heart disease     Past Surgical History:  Procedure Laterality Date  . APPENDECTOMY    . CARDIAC CATHETERIZATION N/A 09/07/2015   Procedure: Left Heart Cath and Coronary Angiography;  Surgeon: Leonie Man, MD;  Location: [place] CV LAB;  Service: Cardiovascular;  Laterality: N/A;  . CARDIAC CATHETERIZATION N/A 09/07/2015   Procedure: Coronary Stent Intervention;  Surgeon: Leonie Man, MD;  Location: [place] CV LAB;  Service: Cardiovascular;  Laterality: N/A;  . CORONARY ANGIOGRAM  09/07/13   residual RCA and OM disease  . CORONARY  ANGIOPLASTY WITH STENT PLACEMENT    . FRACTURE SURGERY    . INSERTION OF ILIAC STENT Left 11/30/2017   Left external illiac stent  . INSERTION OF ILIAC STENT  11/30/2017   Procedure: Insertion Of Iliac Stent;  Surgeon: Lorretta Harp, MD;  Location: [place] CV LAB;  Service: Cardiovascular;;  Left external illiac stent  . KNEE ARTHROSCOPY Left   . KNEE SURGERY     "ligaments, cartilage; tendon, put a pin in" (11/30/2017)  . LEFT HEART CATH Bilateral 07/08/2012   Procedure: LEFT HEART CATH;  Surgeon: [name], MD;   Location: [facility] CATH LAB;  Service: Cardiovascular;  Laterality: Bilateral;  . LEFT HEART CATHETERIZATION WITH CORONARY ANGIOGRAM N/A 09/06/2013   STEMI, 2nd ISR LAD. Procedure: LEFT HEART CATHETERIZATION WITH CORONARY ANGIOGRAM;  Surgeon: [name], MD;  Location: [facility] CATH LAB;  Service: Cardiovascular;  Laterality: N/A;  . LOWER EXTREMITY ANGIOGRAPHY N/A 11/30/2017   Procedure: LOWER EXTREMITY ANGIOGRAPHY;  Surgeon: Lorretta Harp, MD;  Location: [place] CV LAB;  Service: Cardiovascular;  Laterality: N/A;  . PERCUTANEOUS CORONARY STENT INTERVENTION (PCI-S)  07/08/2012   Procedure: PERCUTANEOUS CORONARY STENT INTERVENTION (PCI-S);  Surgeon: [name], MD;  Location: [facility] CATH LAB;  Service: Cardiovascular;;  DES LAD  . PERCUTANEOUS CORONARY STENT INTERVENTION (PCI-S) N/A 09/06/2013   Procedure: PERCUTANEOUS CORONARY STENT INTERVENTION (PCI-S);  Surgeon: [name], MD;  Location: [facility] CATH LAB;  Service: Cardiovascular;  Laterality: N/A;  Mid LAD 3.0/24mm Promus  . WRIST FRACTURE SURGERY Bilateral         Home Medications    Prior to Admission medications   Medication Sig Start Date End Date Taking? Authorizing Provider  aspirin EC 81 MG tablet Take 81 mg by mouth daily.    Yes [provider]  clopidogrel (PLAVIX) 75 MG tablet Take 1 tablet (75 mg total) by mouth daily with breakfast. 12/01/17  Yes Tommie Raymond, NP  Continuous Blood Gluc Receiver (FREESTYLE LIBRE READER) [place] 1 packet by Does not apply route daily. 08/04/16  Yes Rexene Alberts, MD  Continuous Blood Gluc Sensor ([place]) MISC 1 Piece by Does not apply route continuous. 08/04/16  Yes Rexene Alberts, MD  insulin glargine (LANTUS) 100 UNIT/ML injection Inject 20 Units into the skin at bedtime.    Yes [provider]  metFORMIN (GLUCOPHAGE) 1000 MG tablet Take 1 tablet (1,000 mg total) by mouth 2 (two) times daily with a meal. Do not restart Metformin until  Wednesday am 7/2 Patient taking differently: Take 1,000 mg by mouth 2 (two) times daily with a meal.  07/10/12  Yes Turner, Eber Hong, MD  clindamycin (CLEOCIN) 300 MG capsule Take 1 capsule (300 mg total) by mouth 4 (four) times daily. X 7 days 12/09/17   Julianne Rice, MD  HYDROcodone-acetaminophen [facility]) 5-325 MG tablet Take 1 tablet by mouth every 6 (six) hours as needed for severe pain. 12/09/17   Julianne Rice, MD  nicotine (NICODERM CQ) 14 mg/24hr patch Place 1 patch (14 mg total) onto the skin daily. 12/02/17   Milton Ferguson, MD  nitroGLYCERIN (NITROSTAT) 0.4 MG SL tablet Place 1 tablet (0.4 mg total) under the tongue every 5 (five) minutes x 3 doses as needed for chest pain. 05/03/14   Thurnell Lose, MD    Family History Family History  Problem Relation Age of Onset  . Hypertension Mother   . Diabetes Mother     Social History Social History  Tobacco Use  . Smoking status: Former Smoker    Packs/day: 0.50    Years: 48.00    Pack years: 24.00    Types: Cigarettes  . Smokeless tobacco: Never Used  Substance Use Topics  . Alcohol use: Yes    Comment: 11/30/2017 "might drink a beer q 6 months"  . Drug use: No    Types: Cocaine    Comment: 11/30/2017 "last cocaine was ~ 1 wk ago"     Allergies   Patient has no known allergies.   Review of Systems Review of Systems  Constitutional: Negative for chills and fever.  Respiratory: Negative for shortness of breath.   Cardiovascular: Negative for chest pain, palpitations and leg swelling.  Gastrointestinal: Negative for abdominal pain, nausea and vomiting.  Musculoskeletal: Negative for arthralgias and myalgias.  Skin: Positive for wound.  Neurological: Negative for dizziness, weakness, light-headedness and numbness.  All other systems reviewed and are negative.    Physical Exam Updated Vital Signs BP 132/85 (BP Location: Left Arm)   Pulse 77   Temp 97.6 F (36.4 C) (Oral)   Resp 16   Ht 5\' 9"  (1.753 m)    Wt 106.6 kg   SpO2 98%   BMI 34.70 kg/m   Physical Exam  Constitutional: He is oriented to person, place, and time. He appears well-developed and well-nourished.  HENT:  Head: Normocephalic and atraumatic.  Mouth/Throat: Oropharynx is clear and moist.  Eyes: Pupils are equal, round, and reactive to light. EOM are normal.  Neck: Normal range of motion. Neck supple.  Cardiovascular: Normal rate and regular rhythm.  Pulmonary/Chest: Effort normal and breath sounds normal.  Abdominal: Soft. Bowel sounds are normal. There is no tenderness. There is no rebound and no guarding.  Musculoskeletal: Normal range of motion. He exhibits tenderness. He exhibits no edema.  Patient has a small amount of swelling at the insertion site in the left inguinal fold.  Mild tenderness to palpation.  Able to express small amount of bloody purulent material.  Patient has 1+ pulses and bilateral posterior tibial and dorsalis pedis pulses.  Full range of motion of lower extremities.  Neurological: He is alert and oriented to person, place, and time.  Sensation fully intact.  Ambulating without difficulty.  Skin: Skin is warm and dry. No rash noted. No erythema.  Psychiatric: He has a normal mood and affect. His behavior is normal.  Nursing note and vitals reviewed.    ED Treatments / Results  Labs (all labs ordered are listed, but only abnormal results are displayed) Labs Reviewed - No data to display  EKG None  Radiology No results found.  Procedures Procedures (including critical care time)  Medications Ordered in ED Medications  nicotine (NICODERM CQ - dosed in mg/24 hours) patch 14 mg (14 mg Transdermal Patch Applied 12/09/17 1307)  clindamycin (CLEOCIN) capsule 300 mg (300 mg Oral Given 12/09/17 1306)  HYDROcodone-acetaminophen (NORCO/VICODIN) 5-325 MG per tablet 1 tablet (1 tablet Oral Given 12/09/17 1320)     Initial Impression / Assessment and Plan / ED Course  I have reviewed the  triage vital signs and the nursing notes.  Pertinent labs & imaging results that were available during my care of the patient were reviewed by me and considered in my medical decision making (see chart for details).     Patient presents with possible skin infection at the insertion site of his procedure 9 days ago.  Good distal blood flow.  No obvious drainable abscesses.  Will start  antibiotics and have him follow-up closely with Dr. Gwenlyn Found.  Strict return precautions given.  Final Clinical Impressions(s) / ED Diagnoses   Final diagnoses:  Visit for wound check    ED Discharge Orders         Ordered    clindamycin (CLEOCIN) 300 MG capsule  4 times daily     12/09/17 1331    HYDROcodone-acetaminophen (NORCO) 5-325 MG tablet  Every 6 hours PRN     12/09/17 1331           Julianne Rice, MD 12/09/17 1331

## 2017-12-09 NOTE — ED Notes (Signed)
ED Provider at bedside. 

## 2017-12-09 NOTE — ED Notes (Signed)
Pt verbalized understanding of no driving and to use caution within 4 hours of taking pain meds due to meds cause drowsiness.  Instructed pt to take all of antibiotics as prescribed. 

## 2017-12-09 NOTE — ED Triage Notes (Signed)
Patient had sent placed in left groin for DVT on Thursday 11/21. Patient told to come to ED if bleeding or pulsating sensation. Per patient started noticing some bleeding today. Dried blood noted. No active bleeding at this time. Patient takes Plavix but states he hasn't taken it in a week because it upset his stomach when he took it last Friday.

## 2017-12-11 ENCOUNTER — Other Ambulatory Visit: Payer: Self-pay | Admitting: *Deleted

## 2017-12-11 DIAGNOSIS — I739 Peripheral vascular disease, unspecified: Secondary | ICD-10-CM

## 2017-12-19 ENCOUNTER — Ambulatory Visit: Payer: Self-pay | Admitting: Cardiovascular Disease

## 2017-12-23 ENCOUNTER — Encounter: Payer: Self-pay | Admitting: Podiatry

## 2017-12-23 NOTE — Progress Notes (Signed)
Subjective: Patient presents today with diabetes, PAD,  and cc of painful, discolored, thick toenails which interfere with daily activities. Pain is aggravated when wearing enclosed shoe gear. Pain is getting progressively worse and relieved with periodic professional debridement.  System, Pcp Not In Patient is seen at the [facility].   Current Outpatient Medications:  .  aspirin EC 81 MG tablet, Take 81 mg by mouth daily. , Disp: , Rfl:  .  clopidogrel (PLAVIX) 75 MG tablet, Take 1 tablet (75 mg total) by mouth daily with breakfast., Disp: 90 tablet, Rfl: 9 .  Continuous Blood Gluc Receiver (FREESTYLE LIBRE READER) [place], 1 packet by Does not apply route daily., Disp: 1 Device, Rfl: 6 .  Continuous Blood Gluc Sensor (FREESTYLE LIBRE SENSOR SYSTEM) MISC, 1 Piece by Does not apply route continuous., Disp: 3 each, Rfl: 6 .  insulin glargine (LANTUS) 100 UNIT/ML injection, Inject 20 Units into the skin at bedtime. , Disp: , Rfl:  .  metFORMIN (GLUCOPHAGE) 1000 MG tablet, Take 1 tablet (1,000 mg total) by mouth 2 (two) times daily with a meal. Do not restart Metformin until Wednesday am 7/2 (Patient taking differently: Take 1,000 mg by mouth 2 (two) times daily with a meal. ), Disp: , Rfl:  .  nicotine (NICODERM CQ) 14 mg/24hr patch, Place 1 patch (14 mg total) onto the skin daily., Disp: 28 patch, Rfl: 0 .  nitroGLYCERIN (NITROSTAT) 0.4 MG SL tablet, Place 1 tablet (0.4 mg total) under the tongue every 5 (five) minutes x 3 doses as needed for chest pain., Disp: 25 tablet, Rfl: 12 .  clindamycin (CLEOCIN) 300 MG capsule, Take 1 capsule (300 mg total) by mouth 4 (four) times daily. X 7 days, Disp: 28 capsule, Rfl: 0 .  HYDROcodone-acetaminophen (NORCO) 5-325 MG tablet, Take 1 tablet by mouth every 6 (six) hours as needed for severe pain., Disp: 6 tablet, Rfl: 0   No Known Allergies   Objective:  Vascular Examination: Capillary refill time <3 seconds x 10 digits Dorsalis pedis pulses 1/4  b/l Posterior tibial pulses diminished  b/l No digital hair x 10 digits Skin temperature WNL b/l Trace edema BLE No signs of ischemia noted b/l  Dermatological Examination: Skin with normal turgor, texture and tone b/l.  Toenails 1-5 b/l discolored, thick, dystrophic with subungual debris and pain with palpation to nailbeds due to thickness of nails.  Musculoskeletal: Muscle strength 5/5 to all LE muscle groups  Neurological: Sensation intact with 10 gram monofilament. Vibratory sensation diminished b/l  LOWER EXTREMITY ARTERIAL DUPLEX STUDY    Indications: Rest pain, peripheral artery disease, and Decreased pulses        Discoloration left great toe.    High Risk Factors: Hypertension, hyperlipidemia, Diabetes, current smoker, prior           MI, coronary artery disease.    Other Factors: Patient complains of pain in his left foot and left great toe         that has been going on for two months. His left great toe has         been discolored at times and his left foot wakes him up at night         hurting. He doesnt walk alot due to pain in his left knee from an         old TKR.  Current ABI: Right 1.04 and Left 0.71.    Limitations: Technically challenging study due to patients inability to lie down  from chronic back pain.    Performing Technologist: Jeneen Montgomery RDMS, RVT, RDCS      Examination Guidelines: A complete evaluation includes B-mode imaging, spectral  Doppler, color Doppler, and power Doppler as needed of all accessible portions  of each vessel. Bilateral testing is considered an integral part of a complete  examination. Limited examinations for reoccurring indications may be performed  as noted.      Right Duplex Findings:  +-----------+--------+-----+--------+---------+--------+        PSV cm/sRatioStenosisWaveform Comments   +-----------+--------+-----+--------+---------+--------+  CFA Distal 103          triphasic      +-----------+--------+-----+--------+---------+--------+  DFA    86          biphasic       +-----------+--------+-----+--------+---------+--------+  SFA Prox  125          triphasic      +-----------+--------+-----+--------+---------+--------+  SFA Mid  101          triphasic      +-----------+--------+-----+--------+---------+--------+  SFA Distal 55          triphasic      +-----------+--------+-----+--------+---------+--------+  POP Prox  38          triphasic      +-----------+--------+-----+--------+---------+--------+  POP Distal 26          triphasic      +-----------+--------+-----+--------+---------+--------+  ATA Prox  24          biphasic       +-----------+--------+-----+--------+---------+--------+  ATA Mid  36          biphasic       +-----------+--------+-----+--------+---------+--------+  ATA Distal 21          biphasic       +-----------+--------+-----+--------+---------+--------+  PTA Prox  32          triphasic      +-----------+--------+-----+--------+---------+--------+  PTA Mid  45          triphasic      +-----------+--------+-----+--------+---------+--------+  PTA Distal 36          triphasic      +-----------+--------+-----+--------+---------+--------+  PERO Prox 48          triphasic      +-----------+--------+-----+--------+---------+--------+  PERO Mid  36          triphasic      +-----------+--------+-----+--------+---------+--------+  PERO Distal15          triphasic      +-----------+--------+-----+--------+---------+--------+     Atherosclerosis without focal stenosis.      Left Duplex Findings:  +-----------+--------+-----+--------+----------+--------+        PSV cm/sRatioStenosisWaveform Comments  +-----------+--------+-----+--------+----------+--------+  CFA Distal 46          monophasic      +-----------+--------+-----+--------+----------+--------+  DFA    21          monophasic      +-----------+--------+-----+--------+----------+--------+  SFA Prox  47          monophasic      +-----------+--------+-----+--------+----------+--------+  SFA Mid  45          monophasic      +-----------+--------+-----+--------+----------+--------+  SFA Distal 21          monophasic      +-----------+--------+-----+--------+----------+--------+  POP Prox  14          monophasic      +-----------+--------+-----+--------+----------+--------+  POP Distal 13          monophasic      +-----------+--------+-----+--------+----------+--------+  ATA Prox             monophasic      +-----------+--------+-----+--------+----------+--------+  ATA Mid         occluded           +-----------+--------+-----+--------+----------+--------+  ATA Distal        occluded           +-----------+--------+-----+--------+----------+--------+  PTA Prox  44          monophasic      +-----------+--------+-----+--------+----------+--------+  PTA Mid  6                      +-----------+--------+-----+--------+----------+--------+  PTA Distal 6           monophasic      +-----------+--------+-----+--------+----------+--------+  PERO Prox 9           monophasic      +-----------+--------+-----+--------+----------+--------+  PERO Mid   9           monophasic      +-----------+--------+-----+--------+----------+--------+  PERO Distal6           monophasic      +-----------+--------+-----+--------+----------+--------+    Atherosclerosis without focal stenosis.        Summary:  Right: Normal examination. No evidence of arterial occlusive disease.    Left: Total occlusion noted in the anterior tibial artery. CFA waveforms suggest inflow disease in the left leg.    See Arterial Doppler study.      Aorto-iliac Duplex study scheduled 11/01/17.      See table(s) above for measurements and observations.          Electronically signed by Quay Burow MD on 10/27/2017 at 11:11:25 AM.    Assessment: 1. Painful onychomycosis toenails 1-5 b/l 2. NIDDM with Peripheral arterial disease 3. Diabetic neuropathy  Plan: 1.   Toenails 1-5 b/l were debrided in length and girth without iatrogenic bleeding. 1. Patient to continue soft, supportive shoe gear 2. Patient to report any pedal injuries to medical professional  3. Follow up 3 months. Patient/POA to call should there be a concern in the interim.

## 2018-01-11 ENCOUNTER — Ambulatory Visit: Payer: Non-veteran care | Admitting: Cardiovascular Disease

## 2018-03-06 ENCOUNTER — Ambulatory Visit: Payer: Medicare Other | Admitting: Podiatry

## 2018-04-02 ENCOUNTER — Telehealth: Payer: Self-pay | Admitting: *Deleted

## 2018-04-02 NOTE — Telephone Encounter (Signed)
Attempted to contact patient in regards to rescheduling appointment due to covid19.  Left message to return call.

## 2018-04-03 ENCOUNTER — Telehealth: Payer: Self-pay | Admitting: Cardiovascular Disease

## 2018-04-03 NOTE — Telephone Encounter (Signed)
I called and spoke with the patient about rescheduling his appointment given the [place] pandemic.  He is stable from a cardiac standpoint and is agreeable to rescheduling his appointment.  He did tell me that he was hospitalized at the [facility] in [place] and then was transferred to the [facility] in [place].  He said a blood clot was found in his heart and he was put on Xarelto.  I will have to obtain records for personal review.

## 2018-04-04 ENCOUNTER — Encounter: Payer: Self-pay | Admitting: *Deleted

## 2018-04-04 NOTE — Telephone Encounter (Signed)
Rescheduled to 07/16/2018.

## 2018-04-04 NOTE — Telephone Encounter (Signed)
Notes requested from [place].

## 2018-04-05 ENCOUNTER — Ambulatory Visit: Payer: Self-pay | Admitting: Cardiovascular Disease

## 2018-06-27 ENCOUNTER — Telehealth: Payer: Self-pay | Admitting: Cardiovascular Disease

## 2018-06-27 NOTE — Telephone Encounter (Signed)
Patient has upcoming testing in July 2020. He states that Dr. [name] normally does blood work on him. Patient was informed that we would check on this and call patient back.

## 2018-06-28 NOTE — Telephone Encounter (Signed)
LM x2 - no notes to indicate labs would be needed before f/u appt

## 2018-07-16 ENCOUNTER — Ambulatory Visit: Payer: Self-pay | Admitting: Cardiovascular Disease

## 2018-07-27 ENCOUNTER — Other Ambulatory Visit: Payer: Self-pay

## 2018-10-05 ENCOUNTER — Ambulatory Visit (INDEPENDENT_AMBULATORY_CARE_PROVIDER_SITE_OTHER): Payer: Medicare Other | Admitting: Cardiovascular Disease

## 2018-10-05 ENCOUNTER — Encounter: Payer: Self-pay | Admitting: *Deleted

## 2018-10-05 ENCOUNTER — Encounter: Payer: Self-pay | Admitting: Cardiovascular Disease

## 2018-10-05 ENCOUNTER — Other Ambulatory Visit: Payer: Self-pay

## 2018-10-05 VITALS — BP 110/78 | HR 74 | Temp 98.1°F | Ht 69.0 in | Wt 243.0 lb

## 2018-10-05 DIAGNOSIS — I25118 Atherosclerotic heart disease of native coronary artery with other forms of angina pectoris: Secondary | ICD-10-CM | POA: Diagnosis not present

## 2018-10-05 DIAGNOSIS — E785 Hyperlipidemia, unspecified: Secondary | ICD-10-CM

## 2018-10-05 DIAGNOSIS — I255 Ischemic cardiomyopathy: Secondary | ICD-10-CM | POA: Diagnosis not present

## 2018-10-05 DIAGNOSIS — Z955 Presence of coronary angioplasty implant and graft: Secondary | ICD-10-CM | POA: Diagnosis not present

## 2018-10-05 DIAGNOSIS — I739 Peripheral vascular disease, unspecified: Secondary | ICD-10-CM

## 2018-10-05 DIAGNOSIS — I10 Essential (primary) hypertension: Secondary | ICD-10-CM

## 2018-10-05 MED ORDER — NITROGLYCERIN 0.4 MG SL SUBL: 0.4000 mg | SUBLINGUAL_TABLET | SUBLINGUAL | 3 refills | Status: DC | PRN

## 2018-10-05 NOTE — Patient Instructions (Addendum)
Medication Instructions:   Your physician recommends that you continue on your current medications as directed. Please refer to the Current Medication list given to you today.  Labwork:  NONE  Testing/Procedures:  NONE  Follow-Up:  Your physician recommends that you schedule a follow-up appointment in: 3 months with [facility] in [place].  Any Other Special Instructions Will Be Listed Below (If Applicable).  If you need a refill on your cardiac medications before your next appointment, please call your pharmacy.
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
## 2018-10-15 ENCOUNTER — Emergency Department (HOSPITAL_COMMUNITY): Payer: Medicare Other

## 2018-10-15 ENCOUNTER — Encounter (HOSPITAL_COMMUNITY): Payer: Self-pay | Admitting: *Deleted

## 2018-10-15 ENCOUNTER — Observation Stay (HOSPITAL_COMMUNITY): Payer: Medicare Other

## 2018-10-15 ENCOUNTER — Other Ambulatory Visit: Payer: Self-pay

## 2018-10-15 ENCOUNTER — Inpatient Hospital Stay (HOSPITAL_COMMUNITY)
Admission: EM | Admit: 2018-10-15 | Discharge: 2018-10-17 | DRG: 247 | Disposition: A | Discharge status: Home / Self Care | Payer: Medicare Other | Attending: Internal Medicine | Admitting: Internal Medicine

## 2018-10-15 DIAGNOSIS — I723 Aneurysm of iliac artery: Secondary | ICD-10-CM | POA: Diagnosis not present

## 2018-10-15 DIAGNOSIS — E1122 Type 2 diabetes mellitus with diabetic chronic kidney disease: Secondary | ICD-10-CM | POA: Diagnosis present

## 2018-10-15 DIAGNOSIS — Z7901 Long term (current) use of anticoagulants: Secondary | ICD-10-CM | POA: Diagnosis not present

## 2018-10-15 DIAGNOSIS — M6281 Muscle weakness (generalized): Secondary | ICD-10-CM | POA: Diagnosis not present

## 2018-10-15 DIAGNOSIS — E785 Hyperlipidemia, unspecified: Secondary | ICD-10-CM | POA: Diagnosis present

## 2018-10-15 DIAGNOSIS — N182 Chronic kidney disease, stage 2 (mild): Secondary | ICD-10-CM | POA: Diagnosis present

## 2018-10-15 DIAGNOSIS — R079 Chest pain, unspecified: Secondary | ICD-10-CM | POA: Diagnosis present

## 2018-10-15 DIAGNOSIS — K219 Gastro-esophageal reflux disease without esophagitis: Secondary | ICD-10-CM | POA: Diagnosis not present

## 2018-10-15 DIAGNOSIS — E1151 Type 2 diabetes mellitus with diabetic peripheral angiopathy without gangrene: Secondary | ICD-10-CM | POA: Diagnosis not present

## 2018-10-15 DIAGNOSIS — I5022 Chronic systolic (congestive) heart failure: Secondary | ICD-10-CM | POA: Diagnosis present

## 2018-10-15 DIAGNOSIS — E1165 Type 2 diabetes mellitus with hyperglycemia: Secondary | ICD-10-CM | POA: Diagnosis not present

## 2018-10-15 DIAGNOSIS — F1411 Cocaine abuse, in remission: Secondary | ICD-10-CM | POA: Diagnosis present

## 2018-10-15 DIAGNOSIS — I5043 Acute on chronic combined systolic (congestive) and diastolic (congestive) heart failure: Secondary | ICD-10-CM | POA: Diagnosis not present

## 2018-10-15 DIAGNOSIS — Z794 Long term (current) use of insulin: Secondary | ICD-10-CM

## 2018-10-15 DIAGNOSIS — I130 Hypertensive heart and chronic kidney disease with heart failure and stage 1 through stage 4 chronic kidney disease, or unspecified chronic kidney disease: Secondary | ICD-10-CM | POA: Diagnosis present

## 2018-10-15 DIAGNOSIS — I25110 Atherosclerotic heart disease of native coronary artery with unstable angina pectoris: Secondary | ICD-10-CM | POA: Diagnosis not present

## 2018-10-15 DIAGNOSIS — I708 Atherosclerosis of other arteries: Secondary | ICD-10-CM | POA: Diagnosis not present

## 2018-10-15 DIAGNOSIS — Z87891 Personal history of nicotine dependence: Secondary | ICD-10-CM | POA: Diagnosis not present

## 2018-10-15 DIAGNOSIS — I513 Intracardiac thrombosis, not elsewhere classified: Secondary | ICD-10-CM | POA: Diagnosis not present

## 2018-10-15 DIAGNOSIS — I739 Peripheral vascular disease, unspecified: Secondary | ICD-10-CM | POA: Diagnosis not present

## 2018-10-15 DIAGNOSIS — I252 Old myocardial infarction: Secondary | ICD-10-CM

## 2018-10-15 DIAGNOSIS — Z7902 Long term (current) use of antithrombotics/antiplatelets: Secondary | ICD-10-CM | POA: Diagnosis not present

## 2018-10-15 DIAGNOSIS — I2510 Atherosclerotic heart disease of native coronary artery without angina pectoris: Secondary | ICD-10-CM | POA: Diagnosis present

## 2018-10-15 DIAGNOSIS — Z8249 Family history of ischemic heart disease and other diseases of the circulatory system: Secondary | ICD-10-CM

## 2018-10-15 DIAGNOSIS — E669 Obesity, unspecified: Secondary | ICD-10-CM | POA: Diagnosis present

## 2018-10-15 DIAGNOSIS — I119 Hypertensive heart disease without heart failure: Secondary | ICD-10-CM | POA: Diagnosis present

## 2018-10-15 DIAGNOSIS — Z79899 Other long term (current) drug therapy: Secondary | ICD-10-CM

## 2018-10-15 DIAGNOSIS — R29818 Other symptoms and signs involving the nervous system: Secondary | ICD-10-CM | POA: Diagnosis not present

## 2018-10-15 DIAGNOSIS — E1149 Type 2 diabetes mellitus with other diabetic neurological complication: Secondary | ICD-10-CM | POA: Diagnosis not present

## 2018-10-15 DIAGNOSIS — E1140 Type 2 diabetes mellitus with diabetic neuropathy, unspecified: Secondary | ICD-10-CM | POA: Diagnosis not present

## 2018-10-15 DIAGNOSIS — M5126 Other intervertebral disc displacement, lumbar region: Secondary | ICD-10-CM | POA: Diagnosis present

## 2018-10-15 DIAGNOSIS — Z20828 Contact with and (suspected) exposure to other viral communicable diseases: Secondary | ICD-10-CM | POA: Diagnosis present

## 2018-10-15 DIAGNOSIS — G8929 Other chronic pain: Secondary | ICD-10-CM | POA: Diagnosis present

## 2018-10-15 DIAGNOSIS — I10 Essential (primary) hypertension: Secondary | ICD-10-CM | POA: Diagnosis not present

## 2018-10-15 DIAGNOSIS — R072 Precordial pain: Secondary | ICD-10-CM | POA: Diagnosis not present

## 2018-10-15 DIAGNOSIS — Z955 Presence of coronary angioplasty implant and graft: Secondary | ICD-10-CM | POA: Diagnosis not present

## 2018-10-15 DIAGNOSIS — Z6835 Body mass index (BMI) 35.0-35.9, adult: Secondary | ICD-10-CM

## 2018-10-15 DIAGNOSIS — R200 Anesthesia of skin: Secondary | ICD-10-CM | POA: Diagnosis not present

## 2018-10-15 DIAGNOSIS — I200 Unstable angina: Secondary | ICD-10-CM | POA: Diagnosis not present

## 2018-10-15 DIAGNOSIS — Z833 Family history of diabetes mellitus: Secondary | ICD-10-CM

## 2018-10-15 DIAGNOSIS — I255 Ischemic cardiomyopathy: Secondary | ICD-10-CM | POA: Diagnosis not present

## 2018-10-15 DIAGNOSIS — Z79891 Long term (current) use of opiate analgesic: Secondary | ICD-10-CM

## 2018-10-15 DIAGNOSIS — R29898 Other symptoms and signs involving the musculoskeletal system: Secondary | ICD-10-CM

## 2018-10-15 DIAGNOSIS — M542 Cervicalgia: Secondary | ICD-10-CM | POA: Diagnosis not present

## 2018-10-15 DIAGNOSIS — Z7982 Long term (current) use of aspirin: Secondary | ICD-10-CM

## 2018-10-15 LAB — CBC WITH DIFFERENTIAL/PLATELET
Abs Immature Granulocytes: 0.07 10*3/uL (ref 0.00–0.07)
Basophils Absolute: 0.1 10*3/uL (ref 0.0–0.1)
Basophils Relative: 1 %
Eosinophils Absolute: 0.6 10*3/uL — ABNORMAL HIGH (ref 0.0–0.5)
Eosinophils Relative: 6 %
HCT: 43.4 % (ref 39.0–52.0)
Hemoglobin: 13.6 g/dL (ref 13.0–17.0)
Immature Granulocytes: 1 %
Lymphocytes Relative: 15 %
Lymphs Abs: 1.7 10*3/uL (ref 0.7–4.0)
MCH: 25.3 pg — ABNORMAL LOW (ref 26.0–34.0)
MCHC: 31.3 g/dL (ref 30.0–36.0)
MCV: 80.7 fL (ref 80.0–100.0)
Monocytes Absolute: 0.5 10*3/uL (ref 0.1–1.0)
Monocytes Relative: 5 %
Neutro Abs: 8 10*3/uL — ABNORMAL HIGH (ref 1.7–7.7)
Neutrophils Relative %: 72 %
Platelets: 283 10*3/uL (ref 150–400)
RBC: 5.38 MIL/uL (ref 4.22–5.81)
RDW: 18 % — ABNORMAL HIGH (ref 11.5–15.5)
WBC: 10.9 10*3/uL — ABNORMAL HIGH (ref 4.0–10.5)
nRBC: 0 % (ref 0.0–0.2)

## 2018-10-15 LAB — COMPREHENSIVE METABOLIC PANEL
ALT: 11 U/L (ref 0–44)
AST: 14 U/L — ABNORMAL LOW (ref 15–41)
Albumin: 3.5 g/dL (ref 3.5–5.0)
Alkaline Phosphatase: 89 U/L (ref 38–126)
Anion gap: 9 (ref 5–15)
BUN: 10 mg/dL (ref 8–23)
CO2: 25 mmol/L (ref 22–32)
Calcium: 8.5 mg/dL — ABNORMAL LOW (ref 8.9–10.3)
Chloride: 102 mmol/L (ref 98–111)
Creatinine, Ser: 1 mg/dL (ref 0.61–1.24)
GFR calc Af Amer: >60 mL/min (ref >60)
GFR calc non Af Amer: >60 mL/min (ref >60)
Glucose, Bld: 184 mg/dL — ABNORMAL HIGH (ref 70–99)
Potassium: 3.7 mmol/L (ref 3.5–5.1)
Sodium: 136 mmol/L (ref 135–145)
Total Bilirubin: 0.5 mg/dL (ref 0.3–1.2)
Total Protein: 7.5 g/dL (ref 6.5–8.1)

## 2018-10-15 LAB — TSH: TSH: 2.759 u[IU]/mL (ref 0.350–4.500)

## 2018-10-15 LAB — TROPONIN I (HIGH SENSITIVITY)
Troponin I (High Sensitivity): 6 ng/L (ref <18)
Troponin I (High Sensitivity): 5 ng/L (ref <18)

## 2018-10-15 LAB — HEMOGLOBIN A1C
Hgb A1c MFr Bld: 11.3 % — ABNORMAL HIGH (ref 4.8–5.6)
Mean Plasma Glucose: 277.61 mg/dL

## 2018-10-15 LAB — URIC ACID: Uric Acid, Serum: 7.2 mg/dL (ref 3.7–8.6)

## 2018-10-15 LAB — GLUCOSE, CAPILLARY
Glucose-Capillary: 153 mg/dL — ABNORMAL HIGH (ref 70–99)
Glucose-Capillary: 156 mg/dL — ABNORMAL HIGH (ref 70–99)
Glucose-Capillary: 177 mg/dL — ABNORMAL HIGH (ref 70–99)

## 2018-10-15 LAB — VITAMIN B12: Vitamin B-12: 250 pg/mL (ref 180–914)

## 2018-10-15 LAB — HIV ANTIBODY (ROUTINE TESTING W REFLEX): HIV Screen 4th Generation wRfx: NONREACTIVE

## 2018-10-15 LAB — BRAIN NATRIURETIC PEPTIDE: B Natriuretic Peptide: 194 pg/mL — ABNORMAL HIGH (ref 0.0–100.0)

## 2018-10-15 LAB — D-DIMER, QUANTITATIVE: D-Dimer, Quant: 0.32 ug/mL-FEU (ref 0.00–0.50)

## 2018-10-15 LAB — SARS CORONAVIRUS 2 (TAT 6-24 HRS): SARS Coronavirus 2: NEGATIVE

## 2018-10-15 MED ORDER — INSULIN ASPART 100 UNIT/ML ~~LOC~~ SOLN
0.0000 [IU] | Freq: Three times a day (TID) | SUBCUTANEOUS | Status: DC
  Administered 2018-10-15 (×2): 2 [IU] via SUBCUTANEOUS
  Administered 2018-10-16: 1 [IU] via SUBCUTANEOUS
  Administered 2018-10-16: 12:00:00 3 [IU] via SUBCUTANEOUS
  Administered 2018-10-17: 1 [IU] via SUBCUTANEOUS
  Administered 2018-10-17: 11:00:00 2 [IU] via SUBCUTANEOUS

## 2018-10-15 MED ORDER — MORPHINE SULFATE (PF) 4 MG/ML IV SOLN
4.0000 mg | Freq: Once | INTRAVENOUS | Status: AC
  Administered 2018-10-15: 4 mg via INTRAVENOUS
  Filled 2018-10-15: qty 1

## 2018-10-15 MED ORDER — NITROGLYCERIN 0.4 MG SL SUBL: 0.4000 mg | SUBLINGUAL_TABLET | SUBLINGUAL | Status: DC | PRN

## 2018-10-15 MED ORDER — ACETAMINOPHEN 325 MG PO TABS: 650.0000 mg | ORAL_TABLET | Freq: Four times a day (QID) | ORAL | Status: DC | PRN

## 2018-10-15 MED ORDER — IOHEXOL 350 MG/ML SOLN
100.0000 mL | Freq: Once | INTRAVENOUS | Status: AC | PRN
  Administered 2018-10-15: 100 mL via INTRAVENOUS

## 2018-10-15 MED ORDER — PANTOPRAZOLE SODIUM 40 MG PO TBEC
40.0000 mg | DELAYED_RELEASE_TABLET | Freq: Every day | ORAL | Status: DC
  Administered 2018-10-15 – 2018-10-17 (×3): 40 mg via ORAL
  Filled 2018-10-15 (×3): qty 1

## 2018-10-15 MED ORDER — ASPIRIN EC 81 MG PO TBEC
81.0000 mg | DELAYED_RELEASE_TABLET | Freq: Every day | ORAL | Status: DC
  Administered 2018-10-16 – 2018-10-17 (×2): 81 mg via ORAL
  Filled 2018-10-15 (×2): qty 1

## 2018-10-15 MED ORDER — MORPHINE SULFATE (PF) 2 MG/ML IV SOLN
1.0000 mg | INTRAVENOUS | Status: DC | PRN
  Administered 2018-10-15 – 2018-10-16 (×3): 1 mg via INTRAVENOUS
  Filled 2018-10-15 (×3): qty 1

## 2018-10-15 MED ORDER — INSULIN ASPART 100 UNIT/ML ~~LOC~~ SOLN: 0.0000 [IU] | Freq: Every day | SUBCUTANEOUS | Status: DC

## 2018-10-15 MED ORDER — ASPIRIN 81 MG PO CHEW
324.0000 mg | CHEWABLE_TABLET | Freq: Once | ORAL | Status: AC
  Administered 2018-10-15: 324 mg via ORAL
  Filled 2018-10-15: qty 4

## 2018-10-15 MED ORDER — INSULIN GLARGINE 100 UNIT/ML ~~LOC~~ SOLN
20.0000 [IU] | Freq: Every day | SUBCUTANEOUS | Status: DC
  Administered 2018-10-15 – 2018-10-16 (×2): 20 [IU] via SUBCUTANEOUS
  Filled 2018-10-15 (×4): qty 0.2

## 2018-10-15 MED ORDER — FUROSEMIDE 10 MG/ML IJ SOLN
20.0000 mg | Freq: Once | INTRAMUSCULAR | Status: AC
  Administered 2018-10-15: 20 mg via INTRAVENOUS
  Filled 2018-10-15: qty 2

## 2018-10-15 MED ORDER — GABAPENTIN 100 MG PO CAPS
100.0000 mg | ORAL_CAPSULE | Freq: Three times a day (TID) | ORAL | Status: DC
  Administered 2018-10-15 – 2018-10-17 (×6): 100 mg via ORAL
  Filled 2018-10-15 (×6): qty 1

## 2018-10-15 NOTE — ED Notes (Signed)
Patient transported to CT 

## 2018-10-15 NOTE — ED Provider Notes (Signed)
[facility] EMERGENCY DEPARTMENT Provider Note   CSN: [id number] Arrival date & time: 10/15/18  0127     History   Chief Complaint Chief Complaint  Patient presents with   Chest Pain    HPI [name] is a 63 y.o. male.     Patient with history of CAD with stents, CKD, diabetes, hypertension, ischemic cardiomyopathy, remote cocaine abuse presenting with episode of central chest pain that onset around 10 PM while he was sitting at home resting.  Reports pain was in the center of his chest rating to his left arm.  Resolved on its own occurred 1 hour.  There is some associated shortness of breath.  There is some nausea and tingling in his left arm.  No diaphoresis or vomiting.  Patient did not take any nitroglycerin.  He reports he has had intermittent pain and numbness in his left arm for the better part of 1 week.  This pain comes and goes lasting for several hours at a time.  He is noticing weakness in his left arm as well. He has not had any weakness in his legs.  Today he has some numbness in his bilateral big toes any other chest pain but it is since resolved.  No abdominal pain or back pain.  Denies any recent cocaine abuse.  No cough or fever.  Patient did recently see his cardiologist Dr. [name] on 9/25.  Patient was hospitalized at the [place] in January of this year with records summarized as below:   He underwent a nuclear stress test on 02/23/2018 which showed no evidence of ischemia.  LVEF was calculated at 34%.  He underwent an echocardiogram on 02/09/2018 which demonstrated LVEF 40 to 45%.  There were wall motion abnormalities.  The left ventricular apex appeared aneurysmal.  There was grade 2 diastolic dysfunction.  A 10 x 7 mm left ventricular apical thrombus was noted.  He underwent cardiac catheterization on 02/09/2018 which demonstrated ostial LAD 30% stenosis.  There was mild in-stent restenosis in the previously placed stent.  There was an 80% mid LAD  stenosis which appeared to be diffusely diseased distally.  Mid circumflex was 80% stenosed and diffusely diseased.  First obtuse marginal branch was 80% stenosed and diffusely diseased.  The proximal RCA had a 50% stenosis in the distal RCA had an 80% stenosis.  He was thought not to be a candidate for CABG per the [place].  The history is provided by the patient.    Past Medical History:  Diagnosis Date   Bulging lumbar disc    CAD (coronary artery disease) [phone]   a. prior LAD stenting. b. s/p DES to [facility] 08/2015 @ [facility]. c. 04/2016 Cardiac cath at [facility] # vessel CAD, with patent stent in the PLAD and RCA. Diffuse dLAD, OM2, and  RPDA disease. No aortic stenosis. Elevated LVEDP. No lesion to suggest ACS. Continue medical managment.   Chronic lower back pain    CKD (chronic kidney disease), stage II    Cocaine abuse ([place])    DM2 (diabetes mellitus, type 2) (HCC)    GERD (gastroesophageal reflux disease)    Headache    "often; not regular" (11/30/2017)   HTN (hypertension)    Hyperlipidemia    Ischemic cardiomyopathy    MI, old    Obesity    Pneumonia    hx   Sleep apnea    "have machine; I don't use it" (11/30/2017)   Tobacco use  Patient Active Problem List   Diagnosis Date Noted   Claudication in peripheral vascular disease ([place]) 11/30/2017   Peripheral arterial disease ([place]) 11/28/2017   Chest pain 11/03/2016   Constipation 11/03/2016   CKD (chronic kidney disease), stage II 08/02/2016   Cellulitis of right hand 08/02/2016   Chronic pain 08/02/2016   AKI (acute kidney injury) ([place]) 01/31/2016   Lobar pneumonia ([place]) 01/31/2016   Influenza A 01/31/2016   Diarrhea 01/30/2016   NSVT (nonsustained ventricular tachycardia) ([place]) 09/12/2015   Precordial chest pain 09/10/2015   Cocaine use 09/04/2015   Near syncope 06/29/2015   Essential hypertension 06/29/2015   History of heart artery stents 06/29/2015   Insulin dependent  diabetes mellitus 06/29/2015   History of MI (myocardial infarction) [id number]   Chronic systolic CHF [id number]   Musculoskeletal chest pain 05/27/2015   OSA on CPAP 01/21/2015   Rectal bleeding    Stage 3 chronic renal impairment associated with type 2 diabetes mellitus ([place]) 09/09/2013   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 09/09/2013   CAD (coronary artery disease) 09/09/2013   Cardiomyopathy, ischemic-EF 40-45% by echo 09/07/13 09/09/2013   S/P LAD DES June 2014 09/06/2013   Tobacco use disorder 09/06/2013   Hyperglycemia 08/22/2012   NSTEMI (non-ST elevated myocardial infarction) ([place]) 07/07/2012   Dyslipidemia 07/07/2012   Hypertensive heart disease     Past Surgical History:  Procedure Laterality Date   APPENDECTOMY     CARDIAC CATHETERIZATION N/A 09/07/2015   Procedure: Left Heart Cath and Coronary Angiography;  Surgeon: Leonie Man, MD;  Location: [place] CV LAB;  Service: Cardiovascular;  Laterality: N/A;   CARDIAC CATHETERIZATION N/A 09/07/2015   Procedure: Coronary Stent Intervention;  Surgeon: Leonie Man, MD;  Location: [place] CV LAB;  Service: Cardiovascular;  Laterality: N/A;   CORONARY ANGIOGRAM  09/07/13   residual RCA and OM disease   CORONARY ANGIOPLASTY WITH STENT PLACEMENT     FRACTURE SURGERY     INSERTION OF ILIAC STENT Left 11/30/2017   Left external illiac stent   INSERTION OF ILIAC STENT  11/30/2017   Procedure: Insertion Of Iliac Stent;  Surgeon: Lorretta Harp, MD;  Location: [place] CV LAB;  Service: Cardiovascular;;  Left external illiac stent   KNEE ARTHROSCOPY Left    KNEE SURGERY     "ligaments, cartilage; tendon, put a pin in" (11/30/2017)   LEFT HEART CATH Bilateral 07/08/2012   Procedure: LEFT HEART CATH;  Surgeon: [name], MD;  Location: [facility] CATH LAB;  Service: Cardiovascular;  Laterality: Bilateral;   LEFT HEART CATHETERIZATION WITH CORONARY ANGIOGRAM N/A 09/06/2013   STEMI,  2nd ISR LAD. Procedure: LEFT HEART CATHETERIZATION WITH CORONARY ANGIOGRAM;  Surgeon: [name], MD;  Location: [facility] CATH LAB;  Service: Cardiovascular;  Laterality: N/A;   LOWER EXTREMITY ANGIOGRAPHY N/A 11/30/2017   Procedure: LOWER EXTREMITY ANGIOGRAPHY;  Surgeon: Lorretta Harp, MD;  Location: [place] CV LAB;  Service: Cardiovascular;  Laterality: N/A;   PERCUTANEOUS CORONARY STENT INTERVENTION (PCI-S)  07/08/2012   Procedure: PERCUTANEOUS CORONARY STENT INTERVENTION (PCI-S);  Surgeon: [name], MD;  Location: [facility] CATH LAB;  Service: Cardiovascular;;  DES LAD   PERCUTANEOUS CORONARY STENT INTERVENTION (PCI-S) N/A 09/06/2013   Procedure: PERCUTANEOUS CORONARY STENT INTERVENTION (PCI-S);  Surgeon: [name], MD;  Location: [facility] CATH LAB;  Service: Cardiovascular;  Laterality: N/A;  Mid LAD 3.0/24mm Promus   WRIST FRACTURE SURGERY Bilateral         Home Medications  Prior to Admission medications   Medication Sig Start Date End Date Taking? Authorizing Provider  aspirin EC 81 MG tablet Take 81 mg by mouth daily.     [provider]  clopidogrel (PLAVIX) 75 MG tablet Take 1 tablet (75 mg total) by mouth daily with breakfast. 12/01/17   Tommie Raymond, NP  Continuous Blood Gluc Receiver (FREESTYLE LIBRE READER) DEVI 1 packet by Does not apply route daily. 08/04/16   Rexene Alberts, MD  Continuous Blood Gluc Sensor ([place]) MISC 1 Piece by Does not apply route continuous. 08/04/16   Rexene Alberts, MD  HYDROcodone-acetaminophen (NORCO) 5-325 MG tablet Take 1 tablet by mouth every 6 (six) hours as needed for severe pain. 12/09/17   Julianne Rice, MD  insulin glargine (LANTUS) 100 UNIT/ML injection Inject 20 Units into the skin at bedtime.     [provider]  metFORMIN (GLUCOPHAGE) 1000 MG tablet Take 1 tablet (1,000 mg total) by mouth 2 (two) times daily with a meal. Do not restart Metformin until Wednesday am  7/2 Patient taking differently: Take 1,000 mg by mouth 2 (two) times daily with a meal.  07/10/12   Turner, Eber Hong, MD  nitroGLYCERIN (NITROSTAT) 0.4 MG SL tablet Place 1 tablet (0.4 mg total) under the tongue every 5 (five) minutes x 3 doses as needed for chest pain. 10/05/18   Herminio Commons, MD    Family History Family History  Problem Relation Age of Onset   Hypertension Mother    Diabetes Mother     Social History Social History   Tobacco Use   Smoking status: Former Smoker    Packs/day: 0.50    Years: 48.00    Pack years: 24.00    Types: Cigarettes   Smokeless tobacco: Never Used  Substance Use Topics   Alcohol use: Yes    Comment: 11/30/2017 "might drink a beer q 6 months"   Drug use: No    Types: Cocaine    Comment: 11/30/2017 "last cocaine was ~ 1 wk ago"     Allergies   Patient has no known allergies.   Review of Systems Review of Systems  Constitutional: Negative for activity change, appetite change and fever.  HENT: Negative for congestion, nosebleeds and postnasal drip.   Respiratory: Positive for shortness of breath.   Cardiovascular: Positive for chest pain. Negative for palpitations and leg swelling.  Gastrointestinal: Positive for nausea. Negative for abdominal pain and vomiting.  Genitourinary: Negative for dysuria and hematuria.  Musculoskeletal: Negative for arthralgias and myalgias.  Neurological: Positive for weakness and numbness. Negative for dizziness and headaches.   all other systems are negative except as noted in the HPI and PMH.    Physical Exam Updated Vital Signs BP (!) 134/91    Pulse 78    Temp 98.7 F (37.1 C) (Oral)    Resp 17    Ht 5\' 9"  (1.753 m)    Wt 110.2 kg    SpO2 100%    BMI 35.88 kg/m   Physical Exam Vitals signs and nursing note reviewed.  Constitutional:      General: He is not in acute distress.    Appearance: He is well-developed. He is obese.  HENT:     Head: Normocephalic and atraumatic.      Mouth/Throat:     Pharynx: No oropharyngeal exudate.  Eyes:     Conjunctiva/sclera: Conjunctivae normal.     Pupils: Pupils are equal, round, and reactive to light.  Neck:  Musculoskeletal: Normal range of motion and neck supple.     Comments: No meningismus. Cardiovascular:     Rate and Rhythm: Normal rate and regular rhythm.     Heart sounds: Normal heart sounds. No murmur.     Comments: Equal DP pulses and PT pulses and radial pulses throughout Pulmonary:     Effort: Pulmonary effort is normal. No respiratory distress.     Breath sounds: Normal breath sounds.  Chest:     Chest wall: No tenderness.  Abdominal:     Palpations: Abdomen is soft.     Tenderness: There is no abdominal tenderness. There is no guarding or rebound.  Musculoskeletal: Normal range of motion.        General: Tenderness present.     Comments: Right trapezius and paraspinal muscle tenderness  Skin:    General: Skin is warm.     Capillary Refill: Capillary refill takes less than 2 seconds.  Neurological:     Mental Status: He is alert and oriented to person, place, and time.     Cranial Nerves: No cranial nerve deficit.     Motor: No abnormal muscle tone.     Coordination: Coordination normal.     Comments: No ataxia on finger to nose bilaterally. No pronator drift. 5/5 strength throughout Except for decreased grip strength in left arm with weakness in flexion extension of forearm.. CN 2-12 intact./  Slightly decreased grip strength of left arm Equal strength in lower legs.  Psychiatric:        Behavior: Behavior normal.      ED Treatments / Results  Labs (all labs ordered are listed, but only abnormal results are displayed) Labs Reviewed  COMPREHENSIVE METABOLIC PANEL - Abnormal; Notable for the following components:      Result Value   Glucose, Bld 184 (*)    Calcium 8.5 (*)    AST 14 (*)    All other components within normal limits  BRAIN NATRIURETIC PEPTIDE - Abnormal; Notable for the  following components:   B Natriuretic Peptide 194.0 (*)    All other components within normal limits  CBC WITH DIFFERENTIAL/PLATELET - Abnormal; Notable for the following components:   WBC 10.9 (*)    MCH 25.3 (*)    RDW 18.0 (*)    Neutro Abs 8.0 (*)    Eosinophils Absolute 0.6 (*)    All other components within normal limits  SARS CORONAVIRUS 2 (TAT 6-24 HRS)  D-DIMER, QUANTITATIVE (NOT AT [facility])  TROPONIN I (HIGH SENSITIVITY)  TROPONIN I (HIGH SENSITIVITY)    EKG EKG Interpretation  Date/Time:  Monday October 15 2018 01:38:35 EDT Ventricular Rate:  81 PR Interval:    QRS Duration: 89 QT Interval:  381 QTC Calculation: 443 R Axis:   -70 Text Interpretation:  Sinus rhythm Inferior infarct, old Extensive anterior infarct, old Baseline wander in lead(s) V1 V2 No significant change was found Confirmed by Ezequiel Essex [phone]) on 10/15/2018 1:47:45 AM   Radiology Ct Head Wo Contrast  Result Date: 10/15/2018 CLINICAL DATA:  Focal neuro deficit with stroke suspected. Left thumb numbness tonight EXAM: CT HEAD WITHOUT CONTRAST TECHNIQUE: Contiguous axial images were obtained from the base of the skull through the vertex without intravenous contrast. COMPARISON:  08/22/2012 FINDINGS: Brain: No evidence of acute infarction, hemorrhage, hydrocephalus, extra-axial collection or mass lesion/mass effect. Discrete and remote appearing lacunar infarcts at the right internal capsule and left basal ganglia on coronal reformats. Mild patchy low-density in the cerebral white matter. Vascular:  Atherosclerotic calcification. Skull: Normal. Negative for fracture or focal lesion. Sinuses/Orbits: Negative IMPRESSION: 1. No acute finding. 2. Chronic small vessel ischemia. Electronically Signed   By: Monte Fantasia M.D.   On: 10/15/2018 04:31   Dg Chest Portable 1 View  Result Date: 10/15/2018 CLINICAL DATA:  Chest pain EXAM: PORTABLE CHEST 1 VIEW COMPARISON:  11/03/2016 FINDINGS: Cardiomegaly with mild  central congestion. No pleural effusion or focal consolidation. No pneumothorax. IMPRESSION: Cardiomegaly with mild central congestion Electronically Signed   By: Donavan Foil M.D.   On: 10/15/2018 02:21   Ct Angio Chest/abd/pel For Dissection W And/or Wo Contrast  Result Date: 10/15/2018 CLINICAL DATA:  Chest pain with acute aortic syndrome suspected EXAM: CT ANGIOGRAPHY CHEST, ABDOMEN AND PELVIS TECHNIQUE: Multidetector CT imaging through the chest, abdomen and pelvis was performed using the standard protocol during bolus administration of intravenous contrast. Multiplanar reconstructed images and MIPs were obtained and reviewed to evaluate the vascular anatomy. CONTRAST:  131mL OMNIPAQUE IOHEXOL 350 MG/ML SOLN COMPARISON:  Renal stone CT 11/24/2016 FINDINGS: CTA CHEST FINDINGS Cardiovascular: Cardiomegaly without pericardial effusion. There may be left ventricular thinning at the apex from prior infarct. Multifocal coronary atherosclerotic calcification with probable stenting. No evidence of intramural hematoma. No aortic aneurysm or dissection. No pulmonary artery filling defect. Mediastinum/Nodes: Generalized generous sized lymph nodes which may be congestive in this setting. Lungs/Pleura: Right upper lobe atelectasis or scarring. Symmetric interlobular septal thickening at the lung bases. Mosaic attenuation of the lungs which could be from edema or small airways disease. Musculoskeletal: Mid to lower thoracic spondylosis and advanced degenerative disc narrowing. Review of the MIP images confirms the above findings. CTA ABDOMEN AND PELVIS FINDINGS VASCULAR Aorta: Calcified and noncalcified plaque. No aneurysm or dissection. Celiac: Negative. SMA: Mild atherosclerotic plaque. No branch occlusion or beading. Renals: Single bilateral renal arteries which are smooth and widely patent. IMA: Patent. Inflow: Extensive atherosclerotic plaque. There is a left external iliac stent which is patent. Dilated common  iliac arteries measuring up to 2.3 cm on the right, nonprogressive. Veins: Unremarkable in the arterial phase Review of the MIP images confirms the above findings. NON-VASCULAR Hepatobiliary: No focal liver abnormality.No evidence of biliary obstruction or stone. Pancreas: Unremarkable. Spleen: Unremarkable. Adrenals/Urinary Tract: Negative adrenals. No hydronephrosis or stone. Patchy renal cortical scarring on the right. Unremarkable bladder. Stomach/Bowel: No obstruction. No evidence of bowel inflammation. Internal hemorrhoids. Lymphatic: No mass or adenopathy. Reproductive:Negative Other: No ascites or pneumoperitoneum. Musculoskeletal: No acute abnormalities. Advanced lower lumbar degenerative disease. Review of the MIP images confirms the above findings. IMPRESSION: 1. No evidence of acute aortic syndrome.  No aortic aneurysm. 2. Cardiomegaly with probable remote transmural infarct at the left ventricular apex. Mild pulmonary edema. 3. Aortoiliac atherosclerosis with mild aneurysmal enlargement of the common iliac arteries measuring up to 2.4 cm on the right. 4. Generous sized thoracic lymph nodes, possibly congestive in this setting. Electronically Signed   By: Monte Fantasia M.D.   On: 10/15/2018 04:43    Procedures Procedures (including critical care time)  Medications Ordered in ED Medications - No data to display   Initial Impression / Assessment and Plan / ED Course  I have reviewed the triage vital signs and the nursing notes.  Pertinent labs & imaging results that were available during my care of the patient were reviewed by me and considered in my medical decision making (see chart for details).       Central chest pain that has since resolved.  Intermittent arm weakness and numbness  for the past 1 week.  EKG shows anterior and inferior Q waves that are unchanged without acute ST elevation.  Patient given aspirin.  Chest pain has resolved. His EKG is unchanged.  He does have  some weakness in his left arm which may be secondary to pain.  States he has been having this pain and numbness and tingling for the better part of 1 week.  Code stroke not activated due to delay in presentation.  Did have tingling in his bilateral big toes which is since resolved.  Intact distal pulses throughout.  Troponin is negative.  D-dimer is negative.  Given patient's migratory nature of pain to his arm, shoulder and toes, aortic dissection study will be obtained.  CTA negative for aortic dissection or other acute pathology. Does show mild pulmonary congestion and likely chronic infarct of ventricle.  Patient chest pain-free after morphine.  Arm pain has resolved as well.  CT head is negative.  Troponins remain negative.  Patient appears to be on maximal medical therapy for his CAD and was thought not to be a candidate for CABG by the [place].  However his presentation for chest pain is still high risk and he would benefit from observation and likely cardiology evaluation in the morning.  May benefit from further neuro imaging regarding his left arm weakness though CT is negative.  Observation admission discussed with Dr. Dwyane Dee.  Final Clinical Impressions(s) / ED Diagnoses   Final diagnoses:  Chest pain, unspecified type  Left arm weakness    ED Discharge Orders    None       Quadasia Newsham, Annie Main, MD 10/15/18 [phone]

## 2018-10-15 NOTE — ED Triage Notes (Signed)
Pt c/o mid center chest pain with sob that started tonight, left arm pain that started a week ago, left thumb numbness tonight and pain to bilateral big toes,

## 2018-10-15 NOTE — H&P (Signed)
History and Physical    [name] [id number] DOB: 1955-07-27 DOA: 10/15/2018  PCP: System, Pcp Not In (Confirm with patient/family/NH records and if not entered, this has to be entered at [facility] point of entry) Patient coming from: Home   I have personally briefly reviewed patient's old medical records in [place]  Chief Complaint: Chest pain   HPI: 63 year old man from community with past medical history of coronary artery disease status post stent, diabetes mellitus insulin-dependent, hypertension, ischemic cardiomyopathy history of cocaine use presented to the ED with complaint of substernal chest pain around 10 PM.  Describes the pain as nonexertional at rest substernal radiating to the left arm resolved on its own within 1 hour, non-positional nonpleuritic associated with shortness of breath, nausea and tingling of his left arm.  No complaint of palpitation, diaphoresis, vomiting.  No use of antianginal medication.  Has been having intermittent pain and numbness of his left arm for almost 1 week.  Describes pain is intermittent lasting several hours at a time associated with weakness of his left arm.  He also reports numbness in his bilateral big toes.  No complaint of fever, chills, abdominal pain, nausea, vomiting and diarrhea.  No sick contact no travel history  In the ED patient was afebrile with T-max 98.7 mildly hypertensive saturating 100% normal with no tachycardia or tachypnea, labs grossly normal mildly elevated BNP 194 troponins negative x2, mild leukocytosis which is chronic as well as stable with hemoglobin 13.6, CT head unremarkable, CT of chest abdomen and pelvis impressive of Cardiomegaly with probable remote transmural infarct at the leftventricular apex. Mild pulmonary edema.3. Aortoiliac atherosclerosis with mild aneurysmal enlargement of the common iliac arteries measuring up to 2.4 cm on the right.  As patient had extensive cardiac history decision to admit  the patient was made to get recommendation from cardiology    As per cardiology note review : He underwent an echocardiogram on 02/09/2018 which demonstrated LVEF 40 to 45%.  There were wall motion abnormalities.  The left ventricular apex appeared aneurysmal.  There was grade 2 diastolic dysfunction.  A 10 x 7 mm left ventricular apical thrombus was noted.  He underwent cardiac catheterization on 02/09/2018 which demonstrated ostial LAD 30% stenosis.  There was mild in-stent restenosis in the previously placed stent.  There was an 80% mid LAD stenosis which appeared to be diffusely diseased distally.  Mid circumflex was 80% stenosed and diffusely diseased.  First obtuse marginal branch was 80% stenosed and diffusely diseased.  The proximal RCA had a 50% stenosis in the distal RCA had an 80% stenosis.  He was then apparently [facility] for [facility]G consideration.   It appears he was evaluated by CT surgery but was denied CABG.  Cardiology was then consulted for consideration of high risk PCI.  They felt that his symptoms did not represent a non-STEMI but more indicative of GERD symptoms.  It was decided to ultimately pursue medical management. Once it was deemed that medical management would be pursued.  He was placed on Xarelto 20 mg daily.  He was also given a prescription for Prasugrel 10 mg daily.  Review of Systems: As per HPI all other systems reviewed and negative.   Past Medical History:  Diagnosis Date   Bulging lumbar disc    CAD (coronary artery disease) [phone]   a. prior LAD stenting. b. s/p DES to [facility] 08/2015 @ [facility]. c. 04/2016 Cardiac cath at [facility]. #  vessel CAD, with patent stent in the PLAD and RCA. Diffuse dLAD, OM2, and  RPDA disease. No aortic stenosis. Elevated LVEDP. No lesion to suggest ACS. Continue medical managment.   Chronic lower back pain    CKD (chronic kidney disease), stage II    Cocaine abuse ([place])    DM2 (diabetes  mellitus, type 2) (HCC)    GERD (gastroesophageal reflux disease)    Headache    "often; not regular" (11/30/2017)   HTN (hypertension)    Hyperlipidemia    Ischemic cardiomyopathy    MI, old    Obesity    Pneumonia    hx   Sleep apnea    "have machine; I don't use it" (11/30/2017)   Tobacco use     Past Surgical History:  Procedure Laterality Date   APPENDECTOMY     CARDIAC CATHETERIZATION N/A 09/07/2015   Procedure: Left Heart Cath and Coronary Angiography;  Surgeon: Leonie Man, MD;  Location: [place] [facility];  Service: Cardiovascular;  Laterality: N/A;   CARDIAC CATHETERIZATION N/A 09/07/2015   Procedure: Coronary Stent Intervention;  Surgeon: Leonie Man, MD;  Location: [place] [facility];  Service: Cardiovascular;  Laterality: N/A;   CORONARY ANGIOGRAM  09/07/13   residual RCA and OM disease   CORONARY ANGIOPLASTY WITH STENT PLACEMENT     FRACTURE SURGERY     INSERTION OF ILIAC STENT Left 11/30/2017   Left external illiac stent   INSERTION OF ILIAC STENT  11/30/2017   Procedure: Insertion Of Iliac Stent;  Surgeon: Lorretta Harp, MD;  Location: [place] [facility];  Service: Cardiovascular;;  Left external illiac stent   KNEE ARTHROSCOPY Left    KNEE SURGERY     "ligaments, cartilage; tendon, put a pin in" (11/30/2017)   LEFT HEART CATH Bilateral 07/08/2012   Procedure: LEFT HEART CATH;  Surgeon: [name], MD;  Location: [facility] CATH LAB;  Service: Cardiovascular;  Laterality: Bilateral;   LEFT HEART CATHETERIZATION WITH CORONARY ANGIOGRAM N/A 09/06/2013   STEMI, 2nd ISR LAD. Procedure: LEFT HEART CATHETERIZATION WITH CORONARY ANGIOGRAM;  Surgeon: [name], MD;  Location: [facility] CATH LAB;  Service: Cardiovascular;  Laterality: N/A;   LOWER EXTREMITY ANGIOGRAPHY N/A 11/30/2017   Procedure: LOWER EXTREMITY ANGIOGRAPHY;  Surgeon: Lorretta Harp, MD;  Location: [place] [facility];  Service: Cardiovascular;  Laterality: N/A;    PERCUTANEOUS CORONARY STENT INTERVENTION (PCI-S)  07/08/2012   Procedure: PERCUTANEOUS CORONARY STENT INTERVENTION (PCI-S);  Surgeon: [name], MD;  Location: [facility] CATH LAB;  Service: Cardiovascular;;  DES LAD   PERCUTANEOUS CORONARY STENT INTERVENTION (PCI-S) N/A 09/06/2013   Procedure: PERCUTANEOUS CORONARY STENT INTERVENTION (PCI-S);  Surgeon: [name], MD;  Location: [facility] C[facility] LAB;  Service: Cardiovascular;  Laterality: N/A;  Mid LAD 3.0/24mm Promus   WRIST FRACTURE SURGERY Bilateral      reports that he has quit smoking. His smoking use included cigarettes. He has a 24.00 pack-year smoking history. He has never used smokeless tobacco. He reports current alcohol use. He reports that he does not use drugs.  No Known Allergies  Family History  Problem Relation Age of Onset   Hypertension Mother    Diabetes Mother     Acceptable: Family history reviewed and not pertinent (If you reviewed it)  Prior to Admission medications   Medication Sig Start Date End Date Taking? Authorizing Provider  aspirin EC 81 MG tablet Take 81 mg by mouth daily.     [provider]  clopidogrel (PLAVIX) 75 MG tablet Take 1 tablet (75 mg total) by mouth daily with breakfast. 12/01/17   Tommie Raymond, NP  Continuous Blood Gluc Receiver (FREESTYLE LIBRE READER) DEVI 1 packet by Does not apply route daily. 08/04/16   Rexene Alberts, MD  Continuous Blood Gluc Sensor ([place]) MISC 1 Piece by Does not apply route continuous. 08/04/16   Rexene Alberts, MD  HYDROcodone-acetaminophen (NORCO) 5-325 MG tablet Take 1 tablet by mouth every 6 (six) hours as needed for severe pain. 12/09/17   Julianne Rice, MD  insulin glargine (LANTUS) 100 UNIT/ML injection Inject 20 Units into the skin at bedtime.     [provider]  metFORMIN (GLUCOPHAGE) 1000 MG tablet Take 1 tablet (1,000 mg total) by mouth 2 (two) times daily with a meal. Do not restart Metformin until  Wednesday am 7/2 Patient taking differently: Take 1,000 mg by mouth 2 (two) times daily with a meal.  07/10/12   Turner, Eber Hong, MD  nitroGLYCERIN (NITROSTAT) 0.4 MG SL tablet Place 1 tablet (0.4 mg total) under the tongue every 5 (five) minutes x 3 doses as needed for chest pain. 10/05/18   Herminio Commons, MD    Physical Exam: Vitals:   10/15/18 0141 10/15/18 0142  BP: (!) 134/91   Pulse: 78   Resp: 17   Temp: 98.7 F (37.1 C)   TempSrc: Oral   SpO2: 100%   Weight:  110.2 kg  Height:  5\' 9"  (1.753 m)    Constitutional: NAD, calm, comfortable Vitals:   10/15/18 0141 10/15/18 0142  BP: (!) 134/91   Pulse: 78   Resp: 17   Temp: 98.7 F (37.1 C)   TempSrc: Oral   SpO2: 100%   Weight:  110.2 kg  Height:  5\' 9"  (1.753 m)   Eyes: PERRL, lids and conjunctivae normal ENMT: Mucous membranes are moist. Posterior pharynx clear of any exudate or lesions.Normal dentition.  Neck: normal, supple, no masses, no thyromegaly Respiratory: clear to auscultation bilaterally, no wheezing, no crackles. Normal respiratory effort. No accessory muscle use.  Cardiovascular: Regular rate and rhythm, no murmurs / rubs / gallops. No extremity edema. 2+ pedal pulses. No carotid bruits.  Abdomen: no tenderness, no masses palpated. No hepatosplenomegaly. Bowel sounds positive.  Musculoskeletal: no clubbing / cyanosis. No joint deformity upper and lower extremities. Good ROM, no contractures. Normal muscle tone.  Skin: no rashes, lesions, ulcers. No induration Neurologic: CN 2-12 grossly intact. Sensation intact, DTR normal. Strength 5/5 in all 4.  Psychiatric: Normal judgment and insight. Alert and oriented x 3. Normal mood.   Labs on Admission: I have personally reviewed following labs and imaging studies  CBC: Recent Labs  Lab 10/15/18 0153  WBC 10.9*  NEUTROABS 8.0*  HGB 13.6  HCT 43.4  MCV 80.7  PLT [id number]   Basic Metabolic Panel: Recent Labs  Lab 10/15/18 0153  NA 136  K 3.7  CL  102  CO2 25  GLUCOSE 184*  BUN 10  CREATININE 1.00  CALCIUM 8.5*   GFR: Estimated Creatinine Clearance: 91.3 mL/min (by C-G formula based on SCr of 1 mg/dL). Liver Function Tests: Recent Labs  Lab 10/15/18 0153  AST 14*  ALT 11  ALKPHOS 89  BILITOT 0.5  PROT 7.5  ALBUMIN 3.5   No results for input(s): LIPASE, AMYLASE in the last 168 hours. No results for input(s): AMMONIA in the last 168 hours. Coagulation Profile: No results for input(s): INR, PROTIME in the last  168 hours. Cardiac Enzymes: No results for input(s): CKTOTAL, CKMB, CKMBINDEX, TROPONINI in the last 168 hours. BNP (last 3 results) No results for input(s): PROBNP in the last 8760 hours. HbA1C: No results for input(s): HGBA1C in the last 72 hours. CBG: No results for input(s): GLUCAP in the last 168 hours. Lipid Profile: No results for input(s): CHOL, HDL, LDLCALC, TRIG, CHOLHDL, LDLDIRECT in the last 72 hours. Thyroid Function Tests: No results for input(s): TSH, T4TOTAL, FREET4, T3FREE, THYROIDAB in the last 72 hours. Anemia Panel: No results for input(s): VITAMINB12, FOLATE, FERRITIN, TIBC, IRON, RETICCTPCT in the last 72 hours. Urine analysis:    Component Value Date/Time   COLORURINE YELLOW 11/08/2016 1754   APPEARANCEUR CLEAR 11/08/2016 1754   LABSPEC 1.019 11/08/2016 1754   PHURINE 5.0 11/08/2016 1754   GLUCOSEU NEGATIVE 11/08/2016 1754   HGBUR NEGATIVE 11/08/2016 1754   BILIRUBINUR NEGATIVE 11/08/2016 1754   KETONESUR NEGATIVE 11/08/2016 1754   PROTEINUR 100 (A) 11/08/2016 1754   UROBILINOGEN 0.2 09/27/2014 2237   NITRITE NEGATIVE 11/08/2016 1754   LEUKOCYTESUR NEGATIVE 11/08/2016 1754    Radiological Exams on Admission: Ct Head Wo Contrast  Result Date: 10/15/2018 CLINICAL DATA:  Focal neuro deficit with stroke suspected. Left thumb numbness tonight EXAM: CT HEAD WITHOUT CONTRAST TECHNIQUE: Contiguous axial images were obtained from the base of the skull through the vertex without  intravenous contrast. COMPARISON:  08/22/2012 FINDINGS: Brain: No evidence of acute infarction, hemorrhage, hydrocephalus, extra-axial collection or mass lesion/mass effect. Discrete and remote appearing lacunar infarcts at the right internal capsule and left basal ganglia on coronal reformats. Mild patchy low-density in the cerebral white matter. Vascular: Atherosclerotic calcification. Skull: Normal. Negative for fracture or focal lesion. Sinuses/Orbits: Negative IMPRESSION: 1. No acute finding. 2. Chronic small vessel ischemia. Electronically Signed   By: Monte Fantasia M.D.   On: 10/15/2018 04:31   Dg Chest Portable 1 View  Result Date: 10/15/2018 CLINICAL DATA:  Chest pain EXAM: PORTABLE CHEST 1 VIEW COMPARISON:  11/03/2016 FINDINGS: Cardiomegaly with mild central congestion. No pleural effusion or focal consolidation. No pneumothorax. IMPRESSION: Cardiomegaly with mild central congestion Electronically Signed   By: Donavan Foil M.D.   On: 10/15/2018 02:21   Ct Angio Chest/abd/pel For Dissection W And/or Wo Contrast  Result Date: 10/15/2018 CLINICAL DATA:  Chest pain with acute aortic syndrome suspected EXAM: CT ANGIOGRAPHY CHEST, ABDOMEN AND PELVIS TECHNIQUE: Multidetector CT imaging through the chest, abdomen and pelvis was performed using the standard protocol during bolus administration of intravenous contrast. Multiplanar reconstructed images and MIPs were obtained and reviewed to evaluate the vascular anatomy. CONTRAST:  146mL OMNIPAQUE IOHEXOL 350 MG/ML SOLN COMPARISON:  Renal stone CT 11/24/2016 FINDINGS: CTA CHEST FINDINGS Cardiovascular: Cardiomegaly without pericardial effusion. There may be left ventricular thinning at the apex from prior infarct. Multifocal coronary atherosclerotic calcification with probable stenting. No evidence of intramural hematoma. No aortic aneurysm or dissection. No pulmonary artery filling defect. Mediastinum/Nodes: Generalized generous sized lymph nodes which  may be congestive in this setting. Lungs/Pleura: Right upper lobe atelectasis or scarring. Symmetric interlobular septal thickening at the lung bases. Mosaic attenuation of the lungs which could be from edema or small airways disease. Musculoskeletal: Mid to lower thoracic spondylosis and advanced degenerative disc narrowing. Review of the MIP images confirms the above findings. CTA ABDOMEN AND PELVIS FINDINGS VASCULAR Aorta: Calcified and noncalcified plaque. No aneurysm or dissection. Celiac: Negative. SMA: Mild atherosclerotic plaque. No branch occlusion or beading. Renals: Single bilateral renal arteries which are smooth and widely  patent. IMA: Patent. Inflow: Extensive atherosclerotic plaque. There is a left external iliac stent which is patent. Dilated common iliac arteries measuring up to 2.3 cm on the right, nonprogressive. Veins: Unremarkable in the arterial phase Review of the MIP images confirms the above findings. NON-VASCULAR Hepatobiliary: No focal liver abnormality.No evidence of biliary obstruction or stone. Pancreas: Unremarkable. Spleen: Unremarkable. Adrenals/Urinary Tract: Negative adrenals. No hydronephrosis or stone. Patchy renal cortical scarring on the right. Unremarkable bladder. Stomach/Bowel: No obstruction. No evidence of bowel inflammation. Internal hemorrhoids. Lymphatic: No mass or adenopathy. Reproductive:Negative Other: No ascites or pneumoperitoneum. Musculoskeletal: No acute abnormalities. Advanced lower lumbar degenerative disease. Review of the MIP images confirms the above findings. IMPRESSION: 1. No evidence of acute aortic syndrome.  No aortic aneurysm. 2. Cardiomegaly with probable remote transmural infarct at the left ventricular apex. Mild pulmonary edema. 3. Aortoiliac atherosclerosis with mild aneurysmal enlargement of the common iliac arteries measuring up to 2.4 cm on the right. 4. Generous sized thoracic lymph nodes, possibly congestive in this setting. Electronically  Signed   By: Monte Fantasia M.D.   On: 10/15/2018 04:43    EKG: Independently reviewed.  Sinus at 81 with poor R wave progression, Q waves in anterior lateral leads, non-dynamic changes.   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 (For example, if patient is on BP meds at home and you resume or decide to hold them, it is a problem that needs to be her. Same for CAD, COPD, HLD and so on)  Precordial chest pain/ CAD (coronary artery disease) : Presented with nonexertional chest pain with extensive cardiac history EKG with ischemic changes but non-dynamic with poor R wave progression Q waves in the anterior inferior leads.  Was evaluated with troponins which were negative x2 as patient had extensive cardiac history and unintervened stenosed coronary decision to admit the patient was made for cardiology evaluation.  -Continue to trend troponins and serial EKGs to rule out ACS -Get echo in the morning -As patient has recent cath with stenosed vessels and not candidate for CABG, patient could still be considered a candidate for stenting.  Consult cardiology for input -Continue with home regimen of his cardiac medication(Nitroglycerine only  patient is intolerant to beta-blockers, not on statins was given a prescription of Crestor 5 mg once a week but not on it), med rec was pending at the time of note. -Last cardiology note has aspirin Plavix listed as his medication for peripheral vascular disease but patient is also on Xarelto and Prasugrel it is unclear if patient needs to be on all 4 medication, will confirm with cardiology before ordering all these blood thinners.  Apical mural thrombus : History of apical mural thrombus was placed on apixaban as per  documentation.  Med rec pending.  Initiate medication on confirmation.  We will repeat an echo in the morning.    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  Monitor glucose diabetic diet     DVT prophylaxis: On Apixaban  Code Status: Full code  Family Communication:NOne \ Disposition Plan: Home  Consults called: Cardiology  consulted via epic  Admission status: Obs   I certify that at the point of admission it is my clinical judgment that the patient will require inpatient hospital care spanning beyond 2 midnights from the point of admission due to high intensity of service, high risk for further deterioration and high frequency of surveillance required. The following factors support the patient status of inpatient:    Oran Rein MD [facility]  10/15/2018, 5:23 AM

## 2018-10-15 NOTE — TOC Progression Note (Signed)
Transition of Care [facility]) - Progression Note    Patient Details  Name: [name] MRN: [id number] Date of Birth: [birth year]  [facility]) CM/SW Contact  Boneta Lucks, RN Phone Number: 10/15/2018, 3:43 PM  Clinical Narrative:   [facility] patient admitted in observation for Chest pain. Patient is from home.  [facility] notification line to get approval.  Notification number [phone]   Expected Discharge Plan: Home/Self Care Barriers to Discharge: Continued Medical Work up  Expected Discharge Plan and Services Expected Discharge Plan: Home/Self Care     Readmission Risk Interventions No flowsheet data found.

## 2018-10-15 NOTE — Progress Notes (Signed)
Patient seen and examined. Admitted after midnight secondary to chest discomfort and left arm pain/numbness. On exam patient also expressed tingling and numbness on his feet; currently CP free. Patient is afebrile, CXR with cardiomegaly and mild central congestion, no infiltrates. On physical exam positive base neck Lipoma and left elbow with presumed gout tophi. Hemodynamically stable. Please refer to H&P written by Dr. Dwyane Dee for further info/details on admission.  Plan: -follow cardiology service recommendations -check echo (to assess for mural thrombus) -check CT cervical spine, TSH and B12 for further neuropathy evaluation. -check uric acid -follow clinical response  Barton Dubois MD [phone]

## 2018-10-15 NOTE — Care Management Obs Status (Signed)
[place] NOTIFICATION   Patient Details  Name: [name] MRN: [id number] Date of Birth: Jun 21, 1955   Medicare Observation Status Notification Given:  Yes    Tommy Medal 10/15/2018, 4:12 PM

## 2018-10-15 NOTE — Progress Notes (Signed)
Patient c/o chest pain.  Patient had EKG done, placed on 2 liters, and given morphine per previous MD order.  Notified mid-level.  Received no new orders at this time. Mid-level advised to keep patient on 2 liters of oxygen through the night.

## 2018-10-16 ENCOUNTER — Observation Stay (HOSPITAL_BASED_OUTPATIENT_CLINIC_OR_DEPARTMENT_OTHER): Payer: Medicare Other

## 2018-10-16 ENCOUNTER — Inpatient Hospital Stay (HOSPITAL_COMMUNITY)
Admission: EM | Disposition: A | Discharge status: Home / Self Care | Payer: Self-pay | Source: Home / Self Care | Attending: Internal Medicine

## 2018-10-16 DIAGNOSIS — R072 Precordial pain: Secondary | ICD-10-CM | POA: Diagnosis not present

## 2018-10-16 DIAGNOSIS — Z955 Presence of coronary angioplasty implant and graft: Secondary | ICD-10-CM | POA: Diagnosis not present

## 2018-10-16 DIAGNOSIS — E785 Hyperlipidemia, unspecified: Secondary | ICD-10-CM

## 2018-10-16 DIAGNOSIS — I513 Intracardiac thrombosis, not elsewhere classified: Secondary | ICD-10-CM

## 2018-10-16 DIAGNOSIS — Z7901 Long term (current) use of anticoagulants: Secondary | ICD-10-CM | POA: Diagnosis not present

## 2018-10-16 DIAGNOSIS — Z6835 Body mass index (BMI) 35.0-35.9, adult: Secondary | ICD-10-CM | POA: Diagnosis not present

## 2018-10-16 DIAGNOSIS — I708 Atherosclerosis of other arteries: Secondary | ICD-10-CM | POA: Diagnosis present

## 2018-10-16 DIAGNOSIS — F1411 Cocaine abuse, in remission: Secondary | ICD-10-CM | POA: Diagnosis present

## 2018-10-16 DIAGNOSIS — Z794 Long term (current) use of insulin: Secondary | ICD-10-CM | POA: Diagnosis not present

## 2018-10-16 DIAGNOSIS — I25110 Atherosclerotic heart disease of native coronary artery with unstable angina pectoris: Secondary | ICD-10-CM | POA: Diagnosis not present

## 2018-10-16 DIAGNOSIS — E1122 Type 2 diabetes mellitus with diabetic chronic kidney disease: Secondary | ICD-10-CM | POA: Diagnosis present

## 2018-10-16 DIAGNOSIS — I252 Old myocardial infarction: Secondary | ICD-10-CM | POA: Diagnosis not present

## 2018-10-16 DIAGNOSIS — Z7902 Long term (current) use of antithrombotics/antiplatelets: Secondary | ICD-10-CM | POA: Diagnosis not present

## 2018-10-16 DIAGNOSIS — Z87891 Personal history of nicotine dependence: Secondary | ICD-10-CM | POA: Diagnosis not present

## 2018-10-16 DIAGNOSIS — I340 Nonrheumatic mitral (valve) insufficiency: Secondary | ICD-10-CM | POA: Diagnosis not present

## 2018-10-16 DIAGNOSIS — I10 Essential (primary) hypertension: Secondary | ICD-10-CM | POA: Diagnosis not present

## 2018-10-16 DIAGNOSIS — I5043 Acute on chronic combined systolic (congestive) and diastolic (congestive) heart failure: Secondary | ICD-10-CM

## 2018-10-16 DIAGNOSIS — I200 Unstable angina: Secondary | ICD-10-CM | POA: Diagnosis present

## 2018-10-16 DIAGNOSIS — I255 Ischemic cardiomyopathy: Secondary | ICD-10-CM | POA: Diagnosis not present

## 2018-10-16 DIAGNOSIS — E1140 Type 2 diabetes mellitus with diabetic neuropathy, unspecified: Secondary | ICD-10-CM | POA: Diagnosis not present

## 2018-10-16 DIAGNOSIS — E1165 Type 2 diabetes mellitus with hyperglycemia: Secondary | ICD-10-CM | POA: Diagnosis not present

## 2018-10-16 DIAGNOSIS — I130 Hypertensive heart and chronic kidney disease with heart failure and stage 1 through stage 4 chronic kidney disease, or unspecified chronic kidney disease: Secondary | ICD-10-CM | POA: Diagnosis not present

## 2018-10-16 DIAGNOSIS — I5022 Chronic systolic (congestive) heart failure: Secondary | ICD-10-CM | POA: Diagnosis not present

## 2018-10-16 DIAGNOSIS — M5126 Other intervertebral disc displacement, lumbar region: Secondary | ICD-10-CM | POA: Diagnosis present

## 2018-10-16 DIAGNOSIS — R079 Chest pain, unspecified: Secondary | ICD-10-CM | POA: Diagnosis present

## 2018-10-16 DIAGNOSIS — I739 Peripheral vascular disease, unspecified: Secondary | ICD-10-CM

## 2018-10-16 DIAGNOSIS — K219 Gastro-esophageal reflux disease without esophagitis: Secondary | ICD-10-CM | POA: Diagnosis present

## 2018-10-16 DIAGNOSIS — E1149 Type 2 diabetes mellitus with other diabetic neurological complication: Secondary | ICD-10-CM | POA: Diagnosis not present

## 2018-10-16 DIAGNOSIS — N182 Chronic kidney disease, stage 2 (mild): Secondary | ICD-10-CM | POA: Diagnosis not present

## 2018-10-16 DIAGNOSIS — Z20828 Contact with and (suspected) exposure to other viral communicable diseases: Secondary | ICD-10-CM | POA: Diagnosis present

## 2018-10-16 DIAGNOSIS — G8929 Other chronic pain: Secondary | ICD-10-CM | POA: Diagnosis present

## 2018-10-16 DIAGNOSIS — E669 Obesity, unspecified: Secondary | ICD-10-CM | POA: Diagnosis present

## 2018-10-16 DIAGNOSIS — E1151 Type 2 diabetes mellitus with diabetic peripheral angiopathy without gangrene: Secondary | ICD-10-CM | POA: Diagnosis not present

## 2018-10-16 HISTORY — PX: CORONARY STENT INTERVENTION: CATH118234

## 2018-10-16 HISTORY — PX: LEFT HEART CATH AND CORONARY ANGIOGRAPHY: CATH118249

## 2018-10-16 LAB — ECHOCARDIOGRAM COMPLETE
Height: 69 in
Weight: 3869.51 oz

## 2018-10-16 LAB — LIPID PANEL
Cholesterol: 161 mg/dL (ref 0–200)
HDL: 24 mg/dL — ABNORMAL LOW (ref >40)
LDL Cholesterol: 114 mg/dL — ABNORMAL HIGH (ref 0–99)
Total CHOL/HDL Ratio: 6.7 RATIO
Triglycerides: 117 mg/dL (ref <150)
VLDL: 23 mg/dL (ref 0–40)

## 2018-10-16 LAB — GLUCOSE, CAPILLARY
Glucose-Capillary: 141 mg/dL — ABNORMAL HIGH (ref 70–99)
Glucose-Capillary: 222 mg/dL — ABNORMAL HIGH (ref 70–99)
Glucose-Capillary: 121 mg/dL — ABNORMAL HIGH (ref 70–99)
Glucose-Capillary: 190 mg/dL — ABNORMAL HIGH (ref 70–99)

## 2018-10-16 LAB — POCT ACTIVATED CLOTTING TIME
Activated Clotting Time: 164 seconds
Activated Clotting Time: 488 seconds
Activated Clotting Time: 92 seconds

## 2018-10-16 LAB — APTT: aPTT: 31 seconds (ref 24–36)

## 2018-10-16 LAB — TROPONIN I (HIGH SENSITIVITY): Troponin I (High Sensitivity): 4 ng/L (ref <18)

## 2018-10-16 LAB — HEPARIN LEVEL (UNFRACTIONATED): Heparin Unfractionated: 0.15 IU/mL — ABNORMAL LOW (ref 0.30–0.70)

## 2018-10-16 SURGERY — LEFT HEART CATH AND CORONARY ANGIOGRAPHY
Anesthesia: LOCAL

## 2018-10-16 MED ORDER — HEPARIN (PORCINE) IN NACL 1000-0.9 UT/500ML-% IV SOLN
INTRAVENOUS | Status: AC
  Filled 2018-10-16: qty 1000

## 2018-10-16 MED ORDER — HEPARIN (PORCINE) IN NACL 1000-0.9 UT/500ML-% IV SOLN
INTRAVENOUS | Status: DC | PRN
  Administered 2018-10-16 (×3): 500 mL

## 2018-10-16 MED ORDER — LABETALOL HCL 5 MG/ML IV SOLN: 10.0000 mg | INTRAVENOUS | Status: AC | PRN

## 2018-10-16 MED ORDER — MIDAZOLAM HCL 2 MG/2ML IJ SOLN
INTRAMUSCULAR | Status: AC
  Filled 2018-10-16: qty 2

## 2018-10-16 MED ORDER — PRAVASTATIN SODIUM 40 MG PO TABS: 80.0000 mg | ORAL_TABLET | Freq: Every day | ORAL | Status: DC

## 2018-10-16 MED ORDER — NITROGLYCERIN 1 MG/10 ML FOR IR/CATH LAB
INTRA_ARTERIAL | Status: DC | PRN
  Administered 2018-10-16: 200 ug via INTRACORONARY

## 2018-10-16 MED ORDER — FENTANYL CITRATE (PF) 100 MCG/2ML IJ SOLN
INTRAMUSCULAR | Status: DC | PRN
  Administered 2018-10-16 (×3): 25 ug via INTRAVENOUS

## 2018-10-16 MED ORDER — MORPHINE SULFATE (PF) 2 MG/ML IV SOLN
INTRAVENOUS | Status: AC
  Filled 2018-10-16: qty 1

## 2018-10-16 MED ORDER — LIDOCAINE HCL (PF) 1 % IJ SOLN
INTRAMUSCULAR | Status: DC | PRN
  Administered 2018-10-16: 15 mL via INTRADERMAL

## 2018-10-16 MED ORDER — ANGIOPLASTY BOOK
Freq: Once | Status: AC
  Administered 2018-10-16
  Filled 2018-10-16: qty 1

## 2018-10-16 MED ORDER — CLOPIDOGREL BISULFATE 300 MG PO TABS
ORAL_TABLET | ORAL | Status: DC | PRN
  Administered 2018-10-16: 600 mg via ORAL

## 2018-10-16 MED ORDER — FAMOTIDINE IN NACL 20-0.9 MG/50ML-% IV SOLN
INTRAVENOUS | Status: AC
  Filled 2018-10-16: qty 50

## 2018-10-16 MED ORDER — MORPHINE SULFATE (PF) 4 MG/ML IV SOLN
4.0000 mg | Freq: Once | INTRAVENOUS | Status: AC
  Administered 2018-10-16: 03:00:00 4 mg via INTRAVENOUS
  Filled 2018-10-16: qty 1

## 2018-10-16 MED ORDER — SODIUM CHLORIDE 0.9% FLUSH
3.0000 mL | Freq: Two times a day (BID) | INTRAVENOUS | Status: DC
  Administered 2018-10-17: 3 mL via INTRAVENOUS

## 2018-10-16 MED ORDER — SODIUM CHLORIDE 0.9 % IV SOLN: INTRAVENOUS | Status: AC

## 2018-10-16 MED ORDER — HEPARIN (PORCINE) 25000 UT/250ML-% IV SOLN
1200.0000 [IU]/h | INTRAVENOUS | Status: DC
  Administered 2018-10-16: 1200 [IU]/h via INTRAVENOUS
  Filled 2018-10-16: qty 250

## 2018-10-16 MED ORDER — IOHEXOL 350 MG/ML SOLN
INTRAVENOUS | Status: DC | PRN
  Administered 2018-10-16: 15:00:00 150 mL

## 2018-10-16 MED ORDER — FAMOTIDINE IN NACL 20-0.9 MG/50ML-% IV SOLN
INTRAVENOUS | Status: AC | PRN
  Administered 2018-10-16: 20 mg via INTRAVENOUS

## 2018-10-16 MED ORDER — SODIUM CHLORIDE 0.9% FLUSH: 3.0000 mL | INTRAVENOUS | Status: DC | PRN

## 2018-10-16 MED ORDER — CLOPIDOGREL BISULFATE 300 MG PO TABS
ORAL_TABLET | ORAL | Status: AC
  Filled 2018-10-16: qty 2

## 2018-10-16 MED ORDER — METOPROLOL SUCCINATE ER 25 MG PO TB24
25.0000 mg | ORAL_TABLET | Freq: Every day | ORAL | Status: DC
  Administered 2018-10-16 – 2018-10-17 (×2): 25 mg via ORAL
  Filled 2018-10-16 (×2): qty 1

## 2018-10-16 MED ORDER — BIVALIRUDIN BOLUS VIA INFUSION - CUPID
INTRAVENOUS | Status: DC | PRN
  Administered 2018-10-16: 14:00:00 82.275 mg via INTRAVENOUS

## 2018-10-16 MED ORDER — THE SENSUOUS HEART BOOK
Freq: Once | Status: AC
  Administered 2018-10-16
  Filled 2018-10-16: qty 1

## 2018-10-16 MED ORDER — SODIUM CHLORIDE 0.9 % IV SOLN
INTRAVENOUS | Status: DC
  Administered 2018-10-16: 11:00:00 via INTRAVENOUS

## 2018-10-16 MED ORDER — NITROGLYCERIN 1 MG/10 ML FOR IR/CATH LAB
INTRA_ARTERIAL | Status: AC
  Filled 2018-10-16: qty 10

## 2018-10-16 MED ORDER — SODIUM CHLORIDE 0.9 % IV SOLN
INTRAVENOUS | Status: DC | PRN
  Administered 2018-10-16: 14:00:00 1.75 mg/kg/h via INTRAVENOUS

## 2018-10-16 MED ORDER — VITAMIN B-12 1000 MCG PO TABS
1000.0000 ug | ORAL_TABLET | Freq: Two times a day (BID) | ORAL | Status: DC
  Administered 2018-10-16 – 2018-10-17 (×2): 1000 ug via ORAL
  Filled 2018-10-16 (×3): qty 1

## 2018-10-16 MED ORDER — SODIUM CHLORIDE 0.9 % IV SOLN: 250.0000 mL | INTRAVENOUS | Status: DC | PRN

## 2018-10-16 MED ORDER — LIDOCAINE HCL (PF) 1 % IJ SOLN
INTRAMUSCULAR | Status: AC
  Filled 2018-10-16: qty 30

## 2018-10-16 MED ORDER — FENTANYL CITRATE (PF) 100 MCG/2ML IJ SOLN
INTRAMUSCULAR | Status: AC
  Filled 2018-10-16: qty 2

## 2018-10-16 MED ORDER — MORPHINE SULFATE (PF) 2 MG/ML IV SOLN
2.0000 mg | INTRAVENOUS | Status: DC | PRN
  Administered 2018-10-16 – 2018-10-17 (×5): 2 mg via INTRAVENOUS
  Filled 2018-10-16 (×4): qty 1

## 2018-10-16 MED ORDER — CLOPIDOGREL BISULFATE 75 MG PO TABS
75.0000 mg | ORAL_TABLET | Freq: Every day | ORAL | Status: DC
  Administered 2018-10-17: 09:00:00 75 mg via ORAL
  Filled 2018-10-16: qty 1

## 2018-10-16 MED ORDER — MIDAZOLAM HCL 2 MG/2ML IJ SOLN
INTRAMUSCULAR | Status: DC | PRN
  Administered 2018-10-16 (×3): 1 mg via INTRAVENOUS

## 2018-10-16 MED ORDER — HYDRALAZINE HCL 20 MG/ML IJ SOLN: 10.0000 mg | INTRAMUSCULAR | Status: AC | PRN

## 2018-10-16 MED ORDER — BIVALIRUDIN TRIFLUOROACETATE 250 MG IV SOLR
INTRAVENOUS | Status: AC
  Filled 2018-10-16: qty 250

## 2018-10-16 MED ORDER — VERAPAMIL HCL 2.5 MG/ML IV SOLN
INTRAVENOUS | Status: AC
  Filled 2018-10-16: qty 2

## 2018-10-16 SURGICAL SUPPLY — 17 items
BALLN SAPPHIRE 2.0X12 (BALLOONS) ×2
BALLN SAPPHIRE ~~LOC~~ 2.75X15 (BALLOONS) ×1 IMPLANT
BALLOON SAPPHIRE 2.0X12 (BALLOONS) IMPLANT
CATH DXT MULTI JL4 JR4 ANG PIG (CATHETERS) ×1 IMPLANT
CATH VISTA GUIDE 6FR XB3 (CATHETERS) ×1 IMPLANT
GLIDESHEATH SLEND SS 6F .021 (SHEATH) IMPLANT
KIT ENCORE 26 ADVANTAGE (KITS) ×1 IMPLANT
KIT HEART LEFT (KITS) ×2 IMPLANT
PACK CARDIAC CATHETERIZATION (CUSTOM PROCEDURE TRAY) ×2 IMPLANT
SHEATH PINNACLE 5F 10CM (SHEATH) ×1 IMPLANT
SHEATH PINNACLE 6F 10CM (SHEATH) ×1 IMPLANT
SHEATH PROBE COVER 6X72 (BAG) ×1 IMPLANT
STENT SYNERGY DES 2.5X20 (Permanent Stent) ×1 IMPLANT
TRANSDUCER W/STOPCOCK (MISCELLANEOUS) ×2 IMPLANT
TUBING CIL FLEX 10 FLL-RA (TUBING) ×2 IMPLANT
WIRE COUGAR XT STRL 190CM (WIRE) ×1 IMPLANT
WIRE EMERALD 3MM-J .035X150CM (WIRE) ×1 IMPLANT

## 2018-10-16 NOTE — Consult Note (Addendum)
Cardiology Consult    [name] ID: GORDY GNAGEY; [id number]; 09-17-1955   Admit date: 10/15/2018 Date of Consult: 10/16/2018  Primary Care Provider: System, [place] Not In Primary Cardiologist: [name], MD   [name] Profile    [name] is a 63 y.o. male with past medical history of CAD (s/p prior LAD stenting, DES to RCA in 08/2015, patent stent by repeat cath in 04/2016 with diffuse nonobstructive CAD along distal-LAD, OM2 and PDA), PDA (s/p PTA and covered stenting of left proximal left external iliac artery in 11/2017), ischemic cardiomyopathy (EF 45-50% by echo in 08/2015), history of cocaine use, HTN, HLD, and IDDM who is being seen today for Russell evaluation of chest pain at Russell request of Dr. [name].   History of Present Illness    [name] was recently examined by Dr. [name] in 09/2018 and reported having a cardiac catheterization earlier in Russell year at [facility] and was told [name] might need CABG but was later informed [name] "had a clot in his heart" but did not require stenting. Records were received and by review of them [name] had an apical thrombus by echo in 01/2018 and EF was 40-45%. Had a cardiac catheterization on 02/09/2018 which showed 30% ostial LAD 30% stenosis, mild in-stent restenosis in Russell previously placed stent, 80% mid LAD stenosis which appeared to be diffusely diseased distally, 80% mid-LCx, and 80% OM1 and 50 % Prox-RCA with 80% distal-RCA stenosis. Was evaluated by CT Surgery and not felt to be a surgical candidate and high-risk PCI was not pursued either as his symptoms at that time were thought to be consistent with GERD. Did require I&D of a right groin abscess during admission. Was started on Xarelto 20mg  daily, statin, and BB therapy. Prior notes mention Effient but [name] says [name] was never on this.   [name] presented to [facility] ED on 10/15/2018 for evaluation of chest pain for Russell past several weeks. In talking with [name], [name] describes  episodes of chest pain occurring for Russell past 4-6 weeks which can occur at rest or with activity.  [name] has also noted worsening dyspnea on exertion. Russell most concerning part to him is worsening pain down his left arm which resembles when [name] required stenting in Russell past.  [name] says Russell pain radiates all Russell way down to his left wrist.  [name] also reports intermittent pain along his lower extremities which can occur at rest or with activity.  [name] denies any specific orthopnea, PND, lower extremity edema, or palpitations.  [name] was unsure of what medications [name] was currently taking and during this encounter we were able to get in touch with someone in his home who confirmed [name] is taking ASA, Toprol-XL 25mg  daily, Pravastatin 80mg  daily, Protonix, Metformin and Xarelto. Says [name] is no longer on Effient or Plavix.   Initial labs show WBC 10.9, Hgb 13.6, platelets 283, Na+ 136, K+ 3.7, and creatinine 1.00. Initial and delta HS Troponin values negative at 6.00 and 5.00. BNP 194. TSH 2.759. COVID negative. CXR showed cardiomegaly with mild central congestion. CT Head performed due to left arm weakness and showed small vessel ischemia with no acute abnormalities. EKG shows NSR, HR 81 with inferior infarct pattern and TWI along lateral leads.   [name] had recurrent chest pain overnight and repeat tracing shows TWI along Russell inferior leads which is more prominent now when compared to prior tracings.    Past Medical History:  Diagnosis Date  Bulging lumbar disc    CAD (coronary artery disease) [id number]   a. prior LAD stenting. b. s/p DES to [facility] 08/2015 @ [facility]. c. 04/2016 Cardiac cath at [facility]. # vessel CAD, with patent stent in Russell PLAD and RCA. Diffuse dLAD, OM2, and  RPDA disease. No aortic stenosis. Elevated LVEDP. No lesion to suggest ACS. Continue medical managment.   Chronic lower back pain    CKD (chronic kidney disease), stage II    Cocaine abuse ([place])    DM2 (diabetes mellitus, type 2) (HCC)     GERD (gastroesophageal reflux disease)    Headache    "often; not regular" (11/30/2017)   HTN (hypertension)    Hyperlipidemia    Ischemic cardiomyopathy    MI, old    Obesity    Pneumonia    hx   Sleep apnea    "have machine; I don't use it" (11/30/2017)   Tobacco use     Past Surgical History:  Procedure Laterality Date   APPENDECTOMY     CARDIAC CATHETERIZATION N/A 09/07/2015   Procedure: Left Heart Cath and Coronary Angiography;  Surgeon: Leonie Man, MD;  Location: [place] CV LAB;  Service: Cardiovascular;  Laterality: N/A;   CARDIAC CATHETERIZATION N/A 09/07/2015   Procedure: Coronary Stent Intervention;  Surgeon: Leonie Man, MD;  Location: [place] CV LAB;  Service: Cardiovascular;  Laterality: N/A;   CORONARY ANGIOGRAM  09/07/13   residual RCA and OM disease   CORONARY ANGIOPLASTY WITH STENT PLACEMENT     FRACTURE SURGERY     INSERTION OF ILIAC STENT Left 11/30/2017   Left external illiac stent   INSERTION OF ILIAC STENT  11/30/2017   Procedure: Insertion Of Iliac Stent;  Surgeon: Lorretta Harp, MD;  Location: [place] CV LAB;  Service: Cardiovascular;;  Left external illiac stent   KNEE ARTHROSCOPY Left    KNEE SURGERY     "ligaments, cartilage; tendon, put a pin in" (11/30/2017)   LEFT HEART CATH Bilateral 07/08/2012   Procedure: LEFT HEART CATH;  Surgeon: [name], MD;  Location: [facility] CATH LAB;  Service: Cardiovascular;  Laterality: Bilateral;   LEFT HEART CATHETERIZATION WITH CORONARY ANGIOGRAM N/A 09/06/2013   STEMI, 2nd ISR LAD. Procedure: LEFT HEART CATHETERIZATION WITH CORONARY ANGIOGRAM;  Surgeon: [name], MD;  Location: [facility] CATH LAB;  Service: Cardiovascular;  Laterality: N/A;   LOWER EXTREMITY ANGIOGRAPHY N/A 11/30/2017   Procedure: LOWER EXTREMITY ANGIOGRAPHY;  Surgeon: Lorretta Harp, MD;  Location: [place] CV LAB;  Service: Cardiovascular;  Laterality: N/A;   PERCUTANEOUS CORONARY STENT  INTERVENTION (PCI-S)  07/08/2012   Procedure: PERCUTANEOUS CORONARY STENT INTERVENTION (PCI-S);  Surgeon: [name], MD;  Location: [facility] CATH LAB;  Service: Cardiovascular;;  DES LAD   PERCUTANEOUS CORONARY STENT INTERVENTION (PCI-S) N/A 09/06/2013   Procedure: PERCUTANEOUS CORONARY STENT INTERVENTION (PCI-S);  Surgeon: [name], MD;  Location: [facility] CATH LAB;  Service: Cardiovascular;  Laterality: N/A;  Mid LAD 3.0/24mm Promus   WRIST FRACTURE SURGERY Bilateral      Home Medications:  Prior to Admission medications   Medication Sig Start Date End Date Taking? Authorizing Provider  aspirin EC 81 MG tablet Take 81 mg by mouth daily.    Yes [provider]  clopidogrel (PLAVIX) 75 MG tablet Take 1 tablet (75 mg total) by mouth daily with breakfast. 12/01/17  Yes Kathyrn Drown D, NP  insulin glargine (LANTUS) 100 UNIT/ML injection Inject 30 Units into Russell skin at  bedtime.    Yes [provider]  metFORMIN (GLUCOPHAGE) 1000 MG tablet Take 1 tablet (1,000 mg total) by mouth 2 (two) times daily with a meal. Do not restart Metformin until Wednesday am 7/2 [name] taking differently: Take 1,000 mg by mouth 2 (two) times daily with a meal.  07/10/12  Yes Turner, Eber Hong, MD  nitroGLYCERIN (NITROSTAT) 0.4 MG SL tablet Place 1 tablet (0.4 mg total) under Russell tongue every 5 (five) minutes x 3 doses as needed for chest pain. 10/05/18  Yes Herminio Commons, MD  HYDROcodone-acetaminophen (NORCO) 5-325 MG tablet Take 1 tablet by mouth every 6 (six) hours as needed for severe pain. 12/09/17   Julianne Rice, MD    Inpatient Medications: Scheduled Meds:  aspirin EC  81 mg Oral Daily   gabapentin  100 mg Oral TID   insulin aspart  0-5 Units Subcutaneous QHS   insulin aspart  0-9 Units Subcutaneous TID WC   insulin glargine  20 Units Subcutaneous QHS   metoprolol succinate  25 mg Oral Daily   pantoprazole  40 mg Oral Daily   pravastatin  80 mg Oral q1800    vitamin B-12  1,000 mcg Oral BID   Continuous Infusions:  PRN Meds: acetaminophen, morphine injection, nitroGLYCERIN  Allergies:   No Known Allergies  Social History:   Social History   Socioeconomic History   Marital status: Divorced    Spouse name: Not on file   Number of children: Not on file   Years of education: Not on file   Highest education level: Not on file  Occupational History   Not on file  Social Needs   Financial resource strain: Not on file   Food insecurity    Worry: Not on file    Inability: Not on file   Transportation needs    Medical: Not on file    Non-medical: Not on file  Tobacco Use   Smoking status: Former Smoker    Packs/day: 0.50    Years: 48.00    Pack years: 24.00    Types: Cigarettes   Smokeless tobacco: Never Used  Substance and Sexual Activity   Alcohol use: Yes    Comment: 11/30/2017 "might drink a beer q 6 months"   Drug use: No    Types: Cocaine    Comment: 11/30/2017 "last cocaine was ~ 1 wk ago"   Sexual activity: Yes  Lifestyle   Physical activity    Days per week: Not on file    Minutes per session: Not on file   Stress: Not on file  Relationships   Social connections    Talks on phone: Not on file    Gets together: Not on file    Attends religious service: Not on file    Active member of club or organization: Not on file    Attends meetings of clubs or organizations: Not on file    Relationship status: Not on file   Intimate partner violence    Fear of current or ex partner: Not on file    Emotionally abused: Not on file    Physically abused: Not on file    Forced sexual activity: Not on file  Other Topics Concern   Not on file  Social History Narrative   ** Merged History Encounter **         Family History:    Family History  Problem Relation Age of Onset   Hypertension Mother    Diabetes Mother  Review of Systems    General:  No chills, fever, night sweats or weight  changes.  Cardiovascular:  No edema, orthopnea, palpitations, paroxysmal nocturnal dyspnea. Positive for chest pain, left arm pain and dyspnea on exertion.  Dermatological: No rash, lesions/masses Respiratory: No cough, dyspnea Urologic: No hematuria, dysuria Abdominal:   No nausea, vomiting, diarrhea, bright red blood per rectum, melena, or hematemesis Neurologic:  No visual changes, wkns, changes in mental status. All other systems reviewed and are otherwise negative except as noted above.  Physical Exam/Data    Vitals:   10/15/18 1503 10/15/18 2037 10/15/18 2218 10/16/18 0507  BP: 109/68 112/75 107/62 116/77  Pulse: 73 71 69 65  Resp: 18 (!) 21 20 16   Temp: 97.9 F (36.6 C) 98.2 F (36.8 C) 98 F (36.7 C) 98.3 F (36.8 C)  TempSrc: Oral Oral Oral Oral  SpO2: 96% 97% 98% 96%  Weight:      Height:        Intake/Output Summary (Last 24 hours) at 10/16/2018 0920 Last data filed at 10/15/2018 1700 Gross per 24 hour  Intake 480 ml  Output --  Net 480 ml   Filed Weights   10/15/18 0142 10/15/18 1112  Weight: 110.2 kg 109.7 kg   Body mass index is 35.71 kg/m.   General: Pleasant male appearing in NAD Psych: Normal affect. Neuro: Alert and oriented X 3. Moves all extremities spontaneously. HEENT: Normal  Neck: Supple without bruits or JVD. Lungs:  Resp regular and unlabored, CTA without wheezing or rales. Heart: RRR no s3, s4, or murmurs. Abdomen: Soft, non-tender, non-distended, BS + x 4.  Extremities: No clubbing, cyanosis or lower extremity edema. DP/PT/Radials 2+ and equal bilaterally.   EKG:  Russell EKG was personally reviewed and demonstrates: NSR, HR 81 with inferior infarct pattern and TWI along lateral leads.   Telemetry:  Telemetry was personally reviewed and demonstrates: NSR, HR in 60's to 70's with occasional PVC's (couplets at times).    Labs/Studies     Relevant CV Studies:  Cardiac Catheterization: 09/07/2015  Mid RCA lesion, 99 %stenosed. Culprit  lesion  A STENT SYNERGY DES 3X28 drug eluting stent was successfully placed. Post intervention, there is a 0% residual stenosis.  Distal RCA at Russell bifurcation, 80% stenosis.Ost RPDA lesion, 70 %stenosed. - Similar to prior procedure  Ostial LAD ~30% followed by STENT in Prox LAD to Mid LAD Stent - 30 %stenosed. Dist LAD lesion, 65 %stenosed.  Ost 2nd Diag lesion, 80 %stenosed. Jailed by stent  1st Mrg-1 lesion, 60 %stenosed. 1st Mrg-2 lesion, 90 %stenosed. - Similar to prior procedure  There is severe left ventricular systolic dysfunction. Russell left ventricular ejection fraction is 25-35% by visual estimate. With near akinesis of Russell apex and severe hypokinesis of Russell mid anterior and mid inferior walls.  LV end diastolic pressure is severely elevated.  There is mild (2+) mitral regurgitation.   [name]'s coronary artery disease has progressed with significant progression in Russell mid RCA now to 99% stenosis. Russell distal RCA and PDA lesions are stable. Russell OM1 lesions are stable.  Russell LAD stent is patent but with mild in-stent restenosis. There is distal LAD disease.  Successful DES PCI to Russell mid RCA.  Plan:  Transfer to [facility] post procedure unit for sheath removal with manual pressure.  Restart Effient (could change to Plavix if necessary) - for minimum one year. Continue aspirin  Continue statin, consider increasing dose for discharge.  Relook at echocardiogram, because EF by LV  gram was notably lower than echo.  Continue aggressive risk factor modification: Smoking cessation counseling, cocaine abuse counseling, diabetes management, hypertension management and lipid management.  Cardiac Catheterization: 01/2018    Laboratory Data:  Chemistry Recent Labs  Lab 10/15/18 0153  NA 136  K 3.7  CL 102  CO2 25  GLUCOSE 184*  BUN 10  CREATININE 1.00  CALCIUM 8.5*  GFRNONAA >60  GFRAA >60  ANIONGAP 9    Recent Labs  Lab 10/15/18 0153  PROT 7.5  ALBUMIN 3.5  AST  14*  ALT 11  ALKPHOS 89  BILITOT 0.5   Hematology Recent Labs  Lab 10/15/18 0153  WBC 10.9*  RBC 5.38  HGB 13.6  HCT 43.4  MCV 80.7  MCH 25.3*  MCHC 31.3  RDW 18.0*  PLT 283   Cardiac EnzymesNo results for input(s): TROPONINI in Russell last 168 hours. No results for input(s): TROPIPOC in Russell last 168 hours.  BNP Recent Labs  Lab 10/15/18 0153  BNP 194.0*    DDimer  Recent Labs  Lab 10/15/18 0153  DDIMER 0.32    Radiology/Studies:  Ct Head Wo Contrast  Result Date: 10/15/2018 CLINICAL DATA:  Focal neuro deficit with stroke suspected. Left thumb numbness tonight EXAM: CT HEAD WITHOUT CONTRAST TECHNIQUE: Contiguous axial images were obtained from Russell base of Russell skull through Russell vertex without intravenous contrast. COMPARISON:  08/22/2012 FINDINGS: Brain: No evidence of acute infarction, hemorrhage, hydrocephalus, extra-axial collection or mass lesion/mass effect. Discrete and remote appearing lacunar infarcts at Russell right internal capsule and left basal ganglia on coronal reformats. Mild patchy low-density in Russell cerebral white matter. Vascular: Atherosclerotic calcification. Skull: Normal. Negative for fracture or focal lesion. Sinuses/Orbits: Negative IMPRESSION: 1. No acute finding. 2. Chronic small vessel ischemia. Electronically Signed   By: Monte Fantasia M.D.   On: 10/15/2018 04:31   Ct Cervical Spine Wo Contrast  Result Date: 10/15/2018 CLINICAL DATA:  Intermittent pain and numbness in Russell left arm for 1 week. No known injury. EXAM: CT CERVICAL SPINE WITHOUT CONTRAST TECHNIQUE: Multidetector CT imaging of Russell cervical spine was performed without intravenous contrast. Multiplanar CT image reconstructions were also generated. COMPARISON:  Plain film cervical spine 11/21/2009. FINDINGS: Alignment: Maintained mild reversal of lordosis noted. Skull base and vertebrae: No acute fracture. No primary bone lesion or focal pathologic process. Soft tissues and spinal canal: No  prevertebral fluid or swelling. No visible canal hematoma. There is a fat containing lesion in Russell subcutaneous soft tissues of Russell posterior neck on Russell left measuring 3.5 cm craniocaudal by 4 cm transverse by 2.8 cm AP. Russell lesion contains soft tissue components and there is some surrounding stranding. It abuts Russell trapezius. Disc levels: Mild loss of disc space height and endplate spurring are seen from C3-C7 without obvious central canal stenosis. Scattered mild-to-moderate uncovertebral spurring is most notable on Russell left at C5-6. Upper chest: Lung apices clear. Other: None. IMPRESSION: No acute finding. Lipomatous lesion in Russell subcutaneous soft tissues of Russell left neck is worrisome for liposarcoma but could be due to fat necrosis. MRI with and without contrast is recommended for further evaluation. Mild appearing cervical spondylosis. No central canal narrowing by CT scan. Electronically Signed   By: Inge Rise M.D.   On: 10/15/2018 09:02   Dg Chest Portable 1 View  Result Date: 10/15/2018 CLINICAL DATA:  Chest pain EXAM: PORTABLE CHEST 1 VIEW COMPARISON:  11/03/2016 FINDINGS: Cardiomegaly with mild central congestion. No pleural effusion or focal consolidation. No pneumothorax.  IMPRESSION: Cardiomegaly with mild central congestion Electronically Signed   By: Donavan Foil M.D.   On: 10/15/2018 02:21   Ct Angio Chest/abd/pel For Dissection W And/or Wo Contrast  Result Date: 10/15/2018 CLINICAL DATA:  Chest pain with acute aortic syndrome suspected EXAM: CT ANGIOGRAPHY CHEST, ABDOMEN AND PELVIS TECHNIQUE: Multidetector CT imaging through Russell chest, abdomen and pelvis was performed using Russell standard protocol during bolus administration of intravenous contrast. Multiplanar reconstructed images and MIPs were obtained and reviewed to evaluate Russell vascular anatomy. CONTRAST:  146mL OMNIPAQUE IOHEXOL 350 MG/ML SOLN COMPARISON:  Renal stone CT 11/24/2016 FINDINGS: CTA CHEST FINDINGS Cardiovascular:  Cardiomegaly without pericardial effusion. There may be left ventricular thinning at Russell apex from prior infarct. Multifocal coronary atherosclerotic calcification with probable stenting. No evidence of intramural hematoma. No aortic aneurysm or dissection. No pulmonary artery filling defect. Mediastinum/Nodes: Generalized generous sized lymph nodes which may be congestive in this setting. Lungs/Pleura: Right upper lobe atelectasis or scarring. Symmetric interlobular septal thickening at Russell lung bases. Mosaic attenuation of Russell lungs which could be from edema or small airways disease. Musculoskeletal: Mid to lower thoracic spondylosis and advanced degenerative disc narrowing. Review of Russell MIP images confirms Russell above findings. CTA ABDOMEN AND PELVIS FINDINGS VASCULAR Aorta: Calcified and noncalcified plaque. No aneurysm or dissection. Celiac: Negative. SMA: Mild atherosclerotic plaque. No branch occlusion or beading. Renals: Single bilateral renal arteries which are smooth and widely patent. IMA: Patent. Inflow: Extensive atherosclerotic plaque. There is a left external iliac stent which is patent. Dilated common iliac arteries measuring up to 2.3 cm on Russell right, nonprogressive. Veins: Unremarkable in Russell arterial phase Review of Russell MIP images confirms Russell above findings. NON-VASCULAR Hepatobiliary: No focal liver abnormality.No evidence of biliary obstruction or stone. Pancreas: Unremarkable. Spleen: Unremarkable. Adrenals/Urinary Tract: Negative adrenals. No hydronephrosis or stone. Patchy renal cortical scarring on Russell right. Unremarkable bladder. Stomach/Bowel: No obstruction. No evidence of bowel inflammation. Internal hemorrhoids. Lymphatic: No mass or adenopathy. Reproductive:Negative Other: No ascites or pneumoperitoneum. Musculoskeletal: No acute abnormalities. Advanced lower lumbar degenerative disease. Review of Russell MIP images confirms Russell above findings. IMPRESSION: 1. No evidence of acute aortic  syndrome.  No aortic aneurysm. 2. Cardiomegaly with probable remote transmural infarct at Russell left ventricular apex. Mild pulmonary edema. 3. Aortoiliac atherosclerosis with mild aneurysmal enlargement of Russell common iliac arteries measuring up to 2.4 cm on Russell right. 4. Generous sized thoracic lymph nodes, possibly congestive in this setting. Electronically Signed   By: Monte Fantasia M.D.   On: 10/15/2018 04:43     Assessment & Plan    1. Chest Pain with Known CAD - [name] is s/p prior LAD stenting, DES to RCA in 08/2015, patent stent by repeat cath in 04/2016 with diffuse nonobstructive CAD along distal-LAD, OM2 and PDA. Most recent cath in 01/2018 showed 30% ostial LAD 30% stenosis, mild in-stent restenosis in Russell previously placed stent, 80% mid LAD stenosis which appeared to be diffusely diseased distally, 80% mid-LCx, and 80% OM1 and 50 % Prox-RCA with 80% distal-RCA stenosis. Not felt to be CABG candidate per CT Surgery at [facility] and high-risk PCI was not pursued either as his symptoms at that time were thought to be consistent with GERD.  - Initial and delta HS Troponin values negative at 6.00 and 5.00. EKG on admission showed NSR, HR 81 with inferior infarct pattern and TWI along lateral leads but repeat tracing this AM shows TWI along Russell inferior leads which is more prominent now when compared  to prior tracings. Will recheck HS Troponin.  - given his progressive chest pain and left arm pain concerning for unstable angina, will discuss re-look cath with Dr. [name]. Last dose of Xarelto was Sunday. If repeat cath not pursued, would recommend trial of Imdur but would follow BP closely. Continue ASA, statin and BB therapy.   2. Ischemic Cardiomyopathy/Apical Thrombus - diagnosed by echo earlier this year and started on Xarelto for anticoagulation due to concerns with INR follow-up for Coumadin. EF 40-45% at that time. On BB therapy but not on ACE-I/ARB due to soft BP.  - repeat echocardiogram  being obtained at this time. Xarelto currently held in case invasive procedures are indicated. Will discuss with Dr. [name] Bound in regards to starting Heparin if echo not read until later today.   3. HLD - intolerant to Atorvastatin and Crestor in Russell past. Started on Pravastatin 80mg  daily by [place] earlier this year and overall tolerating well. AST 14 and ALT 11. Will recheck FLP and order PTA statin.   4. HTN - BP well-controlled at 105/71 - 130/87 within Russell past 24 hours. Restart PTA Toprol-XL 25mg  daily ([name] denies any recent Cocaine use - UDS pending).  5. IDDM - PTA Metformin held. Continue Lantus and SSI.   6. PVD -  s/p PTA and covered stenting of left proximal left external iliac artery in 11/2017. Followed by Dr. Gwenlyn Found. Remains on ASA 81mg  daily. No longer on Plavix given Russell need for anticoagulation. [name] needs to reestablish with Dr. Gwenlyn Found as an outpatient.      For questions or updates, please contact [place] Please consult [email or web address] for contact info under Cardiology/STEMI.  Signed, Erma Heritage, PA-C 10/16/2018, 9:20 AM Pager: [phone]  [name] was seen and examined, and I agree with Russell history, physical exam, assessment and plan as documented above, with modifications as noted below. I have also personally reviewed all relevant documentation, old records, labs, and both radiographic and cardiovascular studies. I have also independently interpreted old and new ECG's.  Please refer to my office note dated 10/05/2018 which includes a summarization of extensive records which I reviewed from [facility] describing his hospitalizations in January 2020.  [name] presents to [facility] with retrosternal chest discomfort radiating down his left arm and left wrist as well as pain to both big toes.  Several ECGs reviewed.  [name] has dynamic inferior T wave inversions noted shortly after 5 this morning and also upon presentation to Russell ED.  High-sensitivity  troponins are normal.  Diabetes is very poorly controlled with A1c 11.3%.  Echocardiogram has just been performed and is pending interpretation.  Overall presentation appears to be consistent with unstable angina.  Last dose of Xarelto was Sunday.  We will start IV heparin with plans for transfer to [facility] for coronary angiography.  Continue aspirin, pravastatin, and Toprol-XL.   [name], MD, [facility]  10/16/2018 9:51 AM

## 2018-10-16 NOTE — Progress Notes (Signed)
ANTICOAGULATION CONSULT NOTE - Initial Consult  Pharmacy Consult for heparin dosing Indication: ACS/STEMI No Known Allergies  Patient Measurements: Height: 5\' 9"  (175.3 cm) Weight: 241 lb 13.5 oz (109.7 kg) IBW/kg (Calculated) : 70.7 Heparin Dosing Weight:  HEPARIN DW (KG): 94.8   Vital Signs: Temp: 98.3 F (36.8 C) (10/06 0507) Temp Source: Oral (10/06 0507) BP: 116/77 (10/06 0507) Pulse Rate: 65 (10/06 0507)  Labs: Recent Labs    10/15/18 0153 10/15/18 0339 10/16/18 0941  HGB 13.6  --   --   HCT 43.4  --   --   PLT 283  --   --   CREATININE 1.00  --   --   TROPONINIHS 6 5 4     Estimated Creatinine Clearance: 91.1 mL/min (by C-G formula based on SCr of 1 mg/dL).   Medical History: Past Medical History:  Diagnosis Date  . Bulging lumbar disc   . CAD (coronary artery disease) [phone]   a. prior LAD stenting. b. s/p DES to [facility] 08/2015 @ [facility]. c. 04/2016 Cardiac cath at [facility]. # vessel CAD, with patent stent in the PLAD and RCA. Diffuse dLAD, OM2, and  RPDA disease. No aortic stenosis. Elevated LVEDP. No lesion to suggest ACS. Continue medical managment.  . Chronic lower back pain   . CKD (chronic kidney disease), stage II   . Cocaine abuse ([place])   . DM2 (diabetes mellitus, type 2) ([place])   . GERD (gastroesophageal reflux disease)   . Headache    "often; not regular" (11/30/2017)  . HTN (hypertension)   . Hyperlipidemia   . Ischemic cardiomyopathy   . MI, old   . Obesity   . Pneumonia    hx  . Sleep apnea    "have machine; I don't use it" (11/30/2017)  . Tobacco use       Assessment:  Pharmacy consulted to dose heparin infusion for this 63 yo male with complicated cardiac history. He was admitted for chest pain and worsening dyspnea on exertion over the past 4-6 weeks.  He was taking Xarelto prior to admission and his last dose was on 10-13-18 in the morning.  Plan is for re-look cath at [facility] today.   Goal of Therapy:  Heparin level  0.3-0.7 units/ml Monitor platelets by anticoagulation protocol: Yes   Plan:  Start heparin infusion at 1200 units/hr Check anti-Xa level in 6-8 hours and daily while on heparin Continue to monitor H&H and platelets  Despina Pole 10/16/2018,10:57 AM

## 2018-10-16 NOTE — Progress Notes (Signed)
Site area- right  Site Prior to Removal- 0   Pressure Applied For-  20 MInutes   Bedrest Beginning at - 1720   Manual- Yes   Patient Status During Pull- Stable    Post Pull Groin Site- 0   Post Pull Instructions Given- Yes   Post Pull Pulses Present- Yes    Dressing Applied- Tegaderm and Gauze Dressing    Comments:  patient tol fair. Complaining of pain in back

## 2018-10-16 NOTE — Progress Notes (Addendum)
Pt to be transferred to [facility] via [employer]. Report given to [facility] (Angie). No room assignment noted.

## 2018-10-16 NOTE — Progress Notes (Signed)
[facility] here to transfer pt to [facility].

## 2018-10-16 NOTE — Interval H&P Note (Signed)
History and Physical Interval Note:  10/16/2018 1:11 PM  [name]  has presented today for surgery, with the diagnosis of unstable angina.  The various methods of treatment have been discussed with the patient and family. After consideration of risks, benefits and other options for treatment, the patient has consented to  Procedure(s): LEFT HEART CATH AND CORONARY ANGIOGRAPHY (N/A) as a surgical intervention.  The patient's history has been reviewed, patient examined, no change in status, stable for surgery.  I have reviewed the patient's chart and labs.  Questions were answered to the patient's satisfaction.    Cath Lab Visit (complete for each Cath Lab visit)  Clinical Evaluation Leading to the Procedure:   ACS: No.  Non-ACS:    Anginal Classification: CCS III  Anti-ischemic medical therapy: No Therapy  Non-Invasive Test Results: No non-invasive testing performed  Prior CABG: No previous CABG        Lauree Chandler

## 2018-10-16 NOTE — Progress Notes (Signed)
PROGRESS NOTE    [name]  [id number] DOB: 06-Apr-1955 DOA: 10/15/2018 PCP: System, Pcp Not In     Brief Narrative:  63 year old man from community with past medical history of coronary artery disease status post stent, diabetes mellitus insulin-dependent, hypertension, ischemic cardiomyopathy history of cocaine use presented to the ED with complaint of substernal chest pain around 10 PM.  Describes the pain as nonexertional at rest substernal radiating to the left arm resolved on its own within 1 hour, non-positional nonpleuritic associated with shortness of breath, nausea and tingling of his left arm.  No complaint of palpitation, diaphoresis, vomiting.  No use of antianginal medication.  Has been having intermittent pain and numbness of his left arm for almost 1 week.  Describes pain is intermittent lasting several hours at a time associated with weakness of his left arm.  He also reports numbness in his bilateral big toes.  No complaint of fever, chills, abdominal pain, nausea, vomiting and diarrhea.  No sick contact no travel history  In the ED patient was afebrile with T-max 98.7 mildly hypertensive saturating 100% normal with no tachycardia or tachypnea, labs grossly normal mildly elevated BNP 194 troponins negative x2, mild leukocytosis which is chronic as well as stable with hemoglobin 13.6, CT head unremarkable, CT of chest abdomen and pelvis impressive of Cardiomegaly with probable remote transmural infarct at the leftventricular apex. Mild pulmonary edema.3. Aortoiliac atherosclerosis with mild aneurysmal enlargement of the common iliac arteries measuring up to 2.4 cm on the right.  As patient had extensive cardiac history decision to admit the patient was made to get recommendation from cardiology    Assessment & Plan: 1-Precordial chest pain/unstable angina -Patient with significant coronary artery disease -Presentation concerning for unstable angina -Case discussed  with cardiology service who will take patient on the third care and transfer to [facility] for redo heart cath -Continue aspirin, statin and beta-blocker therapy -Started on heparin drip.  2-uncontrolled type 2 diabetes mellitus with neuropathy and hyperglycemia -Continue holding oral hypoglycemic agents while inpatient -Continue insulin therapy -Patient actively following with primary care doctor and endocrinologist at the [facility] -Per his records A1c down from 13-11 -Modified carbohydrate diet has been encouraged.  3-Esential hypertension -Stable and well-controlled-continue current antihypertensive regimen. -Heart healthy diet has been advised.  4-hyperlipidemia -Continue statins.  5-apical mural thrombus -No apical thrombus seen on current echo -Further evaluation and final decision on anticoagulation to be decided by cardiology service.  6-ischemic cardiomyopathy/combined heart failure -Chronic and compensated currently -Further ischemic work-up per cardiology service -Continue beta-blocker therapy -Repeat echo with ejection fraction of 40% -No ACE inhibitors or ARB secondary to soft blood pressure.  7-neuropathy -Borderline low B12 level appreciated; which will be repleted and supplemented. -Normal TSH -Major neuropathic triggering points due to diabetes -Patient started on Neurontin.  8-lipoma -Outpatient follow-up with MRI evaluation to rule out liposarcoma component. -No obstruction on CT scan and no pain reported  9-GERD -continue PPI.   DVT prophylaxis: Heparin drip. Code Status: Full code Family Communication: No family at bedside. Disposition Plan: With concerns for unstable angina.  Patient started on IV heparin and will be transferred on the cardiology service at [facility] for further evaluation and management, including heart catheterization.  Consultants:   Cardiology service  Procedures:   2D echo: 1. Left ventricular ejection  fraction, by visual estimation, is 40%. The left ventricle has moderately decreased function. Normal left ventricular size. There is mildly increased left ventricular hypertrophy. No obvious  apical thrombus. Consider limited  echo with contrast for more optimal apical visualization.  2. Multiple segmental abnormalities exist. See findings.  3. Elevated left ventricular end-diastolic pressure.  4. Left ventricular diastolic Doppler parameters are consistent with pseudonormalization pattern of LV diastolic filling.  5. Global right ventricle has normal systolic function.The right ventricular size is normal. No increase in right ventricular wall thickness.  6. Left atrial size was normal.  7. Right atrial size was normal.  8. Mild to moderate aortic valve annular calcification.  9. The mitral valve is grossly normal. Mild mitral valve regurgitation. 10. The tricuspid valve is grossly normal. Tricuspid valve regurgitation is trivial. 11. The aortic valve is tricuspid Aortic valve regurgitation was not visualized by color flow Doppler. Mild aortic valve sclerosis without stenosis. 12. The pulmonic valve was grossly normal. Pulmonic valve regurgitation is not visualized by color flow Doppler. 13. The inferior vena cava is normal in size with greater than 50% respiratory variability, suggesting right atrial pressure of 3 mmHg.  Antimicrobials:  Anti-infectives (From admission, onward)   None     Subjective: Overnight he still having chest discomfort; this morning still complaining of left upper extremity pain.  Denies shortness of breath or orthopnea at this time.  Patient is afebrile and in no acute distress.  Objective: Vitals:   10/15/18 1503 10/15/18 2037 10/15/18 2218 10/16/18 0507  BP: 109/68 112/75 107/62 116/77  Pulse: 73 71 69 65  Resp: 18 (!) 21 20 16   Temp: 97.9 F (36.6 C) 98.2 F (36.8 C) 98 F (36.7 C) 98.3 F (36.8 C)  TempSrc: Oral Oral Oral Oral  SpO2: 96% 97% 98% 96%   Weight:      Height:        Intake/Output Summary (Last 24 hours) at 10/16/2018 1228 Last data filed at 10/16/2018 0900 Gross per 24 hour  Intake 720 ml  Output --  Net 720 ml   Filed Weights   10/15/18 0142 10/15/18 1112  Weight: 110.2 kg 109.7 kg    Examination: General exam: Alert, awake, oriented x 3; still complaining of intermittent Left upper extremity discomfort and chest pain overnight.  No nausea, no vomiting, no shortness of breath.  Reports still some numbness in his feet. Respiratory system: Clear to auscultation. Respiratory effort normal. Cardiovascular system: RRR. No murmurs, rubs, gallops. Gastrointestinal system: Abdomen is nondistended, soft and nontender. No organomegaly or masses felt. Normal bowel sounds heard. Central nervous system: Alert and oriented. No focal neurological deficits. Extremities: No C/C/E, +pedal pulses Skin: No rashes, no petechiae.  Patient with left trapezius mass on exam demonstrating lipoma, nontender to palpation, mobile and soft. Psychiatry: Judgement and insight appear normal. Mood & affect appropriate.     Data Reviewed: I have personally reviewed following labs and imaging studies  CBC: Recent Labs  Lab 10/15/18 0153  WBC 10.9*  NEUTROABS 8.0*  HGB 13.6  HCT 43.4  MCV 80.7  PLT [id number]   Basic Metabolic Panel: Recent Labs  Lab 10/15/18 0153  NA 136  K 3.7  CL 102  CO2 25  GLUCOSE 184*  BUN 10  CREATININE 1.00  CALCIUM 8.5*   GFR: Estimated Creatinine Clearance: 91.1 mL/min (by C-G formula based on SCr of 1 mg/dL).   Liver Function Tests: Recent Labs  Lab 10/15/18 0153  AST 14*  ALT 11  ALKPHOS 89  BILITOT 0.5  PROT 7.5  ALBUMIN 3.5   HbA1C: Recent Labs    10/15/18 0845  HGBA1C 11.3*  CBG: Recent Labs  Lab 10/15/18 1238 10/15/18 1641 10/15/18 2130 10/16/18 0724 10/16/18 1107  GLUCAP 153* 156* 177* 141* 222*   Lipid Profile: Recent Labs    10/16/18 0941  CHOL 161  HDL 24*  LDLCALC  114*  TRIG 117  CHOLHDL 6.7   Thyroid Function Tests: Recent Labs    10/15/18 0845  TSH 2.759   Anemia Panel: Recent Labs    10/15/18 0845  VITAMINB12 250   Urine analysis:    Component Value Date/Time   COLORURINE YELLOW 11/08/2016 1754   APPEARANCEUR CLEAR 11/08/2016 1754   LABSPEC 1.019 11/08/2016 1754   PHURINE 5.0 11/08/2016 1754   GLUCOSEU NEGATIVE 11/08/2016 1754   HGBUR NEGATIVE 11/08/2016 [place] 11/08/2016 1754   KETONESUR NEGATIVE 11/08/2016 1754   PROTEINUR 100 (A) 11/08/2016 1754   UROBILINOGEN 0.2 09/27/2014 2237   NITRITE NEGATIVE 11/08/2016 1754   LEUKOCYTESUR NEGATIVE 11/08/2016 1754    Recent Results (from the past 240 hour(s))  SARS CORONAVIRUS 2 (TAT 6-24 HRS) Nasopharyngeal Nasopharyngeal Swab     Status: None   Collection Time: 10/15/18  5:18 AM   Specimen: Nasopharyngeal Swab  Result Value Ref Range Status   SARS Coronavirus 2 NEGATIVE NEGATIVE Final    Comment: (NOTE) SARS-CoV-2 target nucleic acids are NOT DETECTED. The SARS-CoV-2 RNA is generally detectable in upper and lower respiratory specimens during the acute phase of infection. Negative results do not preclude SARS-CoV-2 infection, do not rule out co-infections with other pathogens, and should not be used as the sole basis for treatment or other patient management decisions. Negative results must be combined with clinical observations, patient history, and epidemiological information. The expected result is Negative. Fact Sheet for Patients: SugarRoll.be Fact Sheet for Healthcare Providers: https://www.woods-mathews.com/ This test is not yet approved or cleared by the [place] FDA and  has been authorized for detection and/or diagnosis of SARS-CoV-2 by FDA under an Emergency Use Authorization (EUA). This EUA will remain  in effect (meaning this test can be used) for the duration of the COVID-19 declaration under  Section 56 4(b)(1) of the Act, 21 U.S.C. section 360bbb-3(b)(1), unless the authorization is terminated or revoked sooner. Performed at [facility], [place] [address] [place], [place] [address]      Radiology Studies: Ct Head Wo Contrast  Result Date: 10/15/2018 CLINICAL DATA:  Focal neuro deficit with stroke suspected. Left thumb numbness tonight EXAM: CT HEAD WITHOUT CONTRAST TECHNIQUE: Contiguous axial images were obtained from the base of the skull through the vertex without intravenous contrast. COMPARISON:  08/22/2012 FINDINGS: Brain: No evidence of acute infarction, hemorrhage, hydrocephalus, extra-axial collection or mass lesion/mass effect. Discrete and remote appearing lacunar infarcts at the right internal capsule and left basal ganglia on coronal reformats. Mild patchy low-density in the cerebral white matter. Vascular: Atherosclerotic calcification. Skull: Normal. Negative for fracture or focal lesion. Sinuses/Orbits: Negative IMPRESSION: 1. No acute finding. 2. Chronic small vessel ischemia. Electronically Signed   By: Monte Fantasia M.D.   On: 10/15/2018 04:31   Ct Cervical Spine Wo Contrast  Result Date: 10/15/2018 CLINICAL DATA:  Intermittent pain and numbness in the left arm for 1 week. No known injury. EXAM: CT CERVICAL SPINE WITHOUT CONTRAST TECHNIQUE: Multidetector CT imaging of the cervical spine was performed without intravenous contrast. Multiplanar CT image reconstructions were also generated. COMPARISON:  Plain film cervical spine 11/21/2009. FINDINGS: Alignment: Maintained mild reversal of lordosis noted. Skull base and vertebrae: No acute fracture. No primary bone  lesion or focal pathologic process. Soft tissues and spinal canal: No prevertebral fluid or swelling. No visible canal hematoma. There is a fat containing lesion in the subcutaneous soft tissues of the posterior neck on the left measuring 3.5 cm craniocaudal by 4 cm transverse by 2.8 cm AP. The lesion  contains soft tissue components and there is some surrounding stranding. It abuts the trapezius. Disc levels: Mild loss of disc space height and endplate spurring are seen from C3-C7 without obvious central canal stenosis. Scattered mild-to-moderate uncovertebral spurring is most notable on the left at C5-6. Upper chest: Lung apices clear. Other: None. IMPRESSION: No acute finding. Lipomatous lesion in the subcutaneous soft tissues of the left neck is worrisome for liposarcoma but could be due to fat necrosis. MRI with and without contrast is recommended for further evaluation. Mild appearing cervical spondylosis. No central canal narrowing by CT scan. Electronically Signed   By: Inge Rise M.D.   On: 10/15/2018 09:02   Dg Chest Portable 1 View  Result Date: 10/15/2018 CLINICAL DATA:  Chest pain EXAM: PORTABLE CHEST 1 VIEW COMPARISON:  11/03/2016 FINDINGS: Cardiomegaly with mild central congestion. No pleural effusion or focal consolidation. No pneumothorax. IMPRESSION: Cardiomegaly with mild central congestion Electronically Signed   By: Donavan Foil M.D.   On: 10/15/2018 02:21   Ct Angio Chest/abd/pel For Dissection W And/or Wo Contrast  Result Date: 10/15/2018 CLINICAL DATA:  Chest pain with acute aortic syndrome suspected EXAM: CT ANGIOGRAPHY CHEST, ABDOMEN AND PELVIS TECHNIQUE: Multidetector CT imaging through the chest, abdomen and pelvis was performed using the standard protocol during bolus administration of intravenous contrast. Multiplanar reconstructed images and MIPs were obtained and reviewed to evaluate the vascular anatomy. CONTRAST:  171mL OMNIPAQUE IOHEXOL 350 MG/ML SOLN COMPARISON:  Renal stone CT 11/24/2016 FINDINGS: CTA CHEST FINDINGS Cardiovascular: Cardiomegaly without pericardial effusion. There may be left ventricular thinning at the apex from prior infarct. Multifocal coronary atherosclerotic calcification with probable stenting. No evidence of intramural hematoma. No aortic  aneurysm or dissection. No pulmonary artery filling defect. Mediastinum/Nodes: Generalized generous sized lymph nodes which may be congestive in this setting. Lungs/Pleura: Right upper lobe atelectasis or scarring. Symmetric interlobular septal thickening at the lung bases. Mosaic attenuation of the lungs which could be from edema or small airways disease. Musculoskeletal: Mid to lower thoracic spondylosis and advanced degenerative disc narrowing. Review of the MIP images confirms the above findings. CTA ABDOMEN AND PELVIS FINDINGS VASCULAR Aorta: Calcified and noncalcified plaque. No aneurysm or dissection. Celiac: Negative. SMA: Mild atherosclerotic plaque. No branch occlusion or beading. Renals: Single bilateral renal arteries which are smooth and widely patent. IMA: Patent. Inflow: Extensive atherosclerotic plaque. There is a left external iliac stent which is patent. Dilated common iliac arteries measuring up to 2.3 cm on the right, nonprogressive. Veins: Unremarkable in the arterial phase Review of the MIP images confirms the above findings. NON-VASCULAR Hepatobiliary: No focal liver abnormality.No evidence of biliary obstruction or stone. Pancreas: Unremarkable. Spleen: Unremarkable. Adrenals/Urinary Tract: Negative adrenals. No hydronephrosis or stone. Patchy renal cortical scarring on the right. Unremarkable bladder. Stomach/Bowel: No obstruction. No evidence of bowel inflammation. Internal hemorrhoids. Lymphatic: No mass or adenopathy. Reproductive:Negative Other: No ascites or pneumoperitoneum. Musculoskeletal: No acute abnormalities. Advanced lower lumbar degenerative disease. Review of the MIP images confirms the above findings. IMPRESSION: 1. No evidence of acute aortic syndrome.  No aortic aneurysm. 2. Cardiomegaly with probable remote transmural infarct at the left ventricular apex. Mild pulmonary edema. 3. Aortoiliac atherosclerosis with mild aneurysmal enlargement of  the common iliac arteries  measuring up to 2.4 cm on the right. 4. Generous sized thoracic lymph nodes, possibly congestive in this setting. Electronically Signed   By: Monte Fantasia M.D.   On: 10/15/2018 04:43    Scheduled Meds:  aspirin EC  81 mg Oral Daily   gabapentin  100 mg Oral TID   insulin aspart  0-5 Units Subcutaneous QHS   insulin aspart  0-9 Units Subcutaneous TID WC   insulin glargine  20 Units Subcutaneous QHS   metoprolol succinate  25 mg Oral Daily   pantoprazole  40 mg Oral Daily   pravastatin  80 mg Oral q1800   vitamin B-12  1,000 mcg Oral BID   Continuous Infusions:  sodium chloride 125 mL/hr at 10/16/18 1041   heparin 1,200 Units/hr (10/16/18 1138)     LOS: 0 days    Time spent: 30 minutes.   Barton Dubois, MD [facility] Pager [phone]   10/16/2018, 12:28 PM

## 2018-10-16 NOTE — H&P (View-Only) (Signed)
Cardiology Consult    Patient ID: [name]; [id number]; 1955-08-17   Admit date: 10/15/2018 Date of Consult: 10/16/2018  Primary Care Provider: [facility], [place] Not In Primary Cardiologist: [name], MD   Patient Profile    [name] is a 63 y.o. male with past medical history of CAD (s/p prior LAD stenting, DES to RCA in 08/2015, patent stent by repeat cath in 04/2016 with diffuse nonobstructive CAD along distal-LAD, OM2 and PDA), PDA (s/p PTA and covered stenting of left proximal left external iliac artery in 11/2017), ischemic cardiomyopathy (EF 45-50% by echo in 08/2015), history of cocaine use, HTN, HLD, and IDDM who is being seen today for the evaluation of chest pain at the request of Dr. [name].   History of Present Illness    [name] was recently examined by Dr. [name] in 09/2018 and reported having a cardiac catheterization earlier in the year at [facility] and was told he might need CABG but was later informed he "had a clot in his heart" but did not require stenting. Records were received and by review of them he had an apical thrombus by echo in 01/2018 and EF was 40-45%. Had a cardiac catheterization on 02/09/2018 which showed 30% ostial LAD 30% stenosis, mild in-stent restenosis in the previously placed stent, 80% mid LAD stenosis which appeared to be diffusely diseased distally, 80% mid-LCx, and 80% OM1 and 50 % Prox-RCA with 80% distal-RCA stenosis. Was evaluated by CT Surgery and not felt to be a surgical candidate and high-risk PCI was not pursued either as his symptoms at that time were thought to be consistent with GERD. Did require I&D of a right groin abscess during admission. Was started on Xarelto 20mg  daily, statin, and BB therapy. Prior notes mention Effient but the patient says he was never on this.   He presented to [facility] ED on 10/15/2018 for evaluation of chest pain for the past several weeks. In talking with the patient, he describes  episodes of chest pain occurring for the past 4-6 weeks which can occur at rest or with activity.  He has also noted worsening dyspnea on exertion. The most concerning part to him is worsening pain down his left arm which resembles when he required stenting in the past.  He says the pain radiates all the way down to his left wrist.  He also reports intermittent pain along his lower extremities which can occur at rest or with activity.  He denies any specific orthopnea, PND, lower extremity edema, or palpitations.  He was unsure of what medications he was currently taking and during this encounter we were able to get in touch with someone in his home who confirmed he is taking ASA, Toprol-XL 25mg  daily, Pravastatin 80mg  daily, Protonix, Metformin and Xarelto. Says he is no longer on Effient or Plavix.   Initial labs show WBC 10.9, Hgb 13.6, platelets 283, Na+ 136, K+ 3.7, and creatinine 1.00. Initial and delta HS Troponin values negative at 6.00 and 5.00. BNP 194. TSH 2.759. COVID negative. CXR showed cardiomegaly with mild central congestion. CT Head performed due to left arm weakness and showed small vessel ischemia with no acute abnormalities. EKG shows NSR, HR 81 with inferior infarct pattern and TWI along lateral leads.   He had recurrent chest pain overnight and repeat tracing shows TWI along the inferior leads which is more prominent now when compared to prior tracings.    Past Medical History:  Diagnosis Date  Bulging lumbar disc    CAD (coronary artery disease) [id number]   a. prior LAD stenting. b. s/p DES to [facility] 08/2015 @ [facility]. c. 04/2016 Cardiac cath at [facility]. # vessel CAD, with patent stent in the PLAD and RCA. Diffuse dLAD, OM2, and  RPDA disease. No aortic stenosis. Elevated LVEDP. No lesion to suggest ACS. Continue medical managment.   Chronic lower back pain    CKD (chronic kidney disease), stage II    Cocaine abuse ([place])    DM2 (diabetes mellitus, type 2) (HCC)     GERD (gastroesophageal reflux disease)    Headache    "often; not regular" (11/30/2017)   HTN (hypertension)    Hyperlipidemia    Ischemic cardiomyopathy    MI, old    Obesity    Pneumonia    hx   Sleep apnea    "have machine; I don't use it" (11/30/2017)   Tobacco use     Past Surgical History:  Procedure Laterality Date   APPENDECTOMY     CARDIAC CATHETERIZATION N/A 09/07/2015   Procedure: Left Heart Cath and Coronary Angiography;  Surgeon: Leonie Man, MD;  Location: [place] CV LAB;  Service: Cardiovascular;  Laterality: N/A;   CARDIAC CATHETERIZATION N/A 09/07/2015   Procedure: Coronary Stent Intervention;  Surgeon: Leonie Man, MD;  Location: [place] CV LAB;  Service: Cardiovascular;  Laterality: N/A;   CORONARY ANGIOGRAM  09/07/13   residual RCA and OM disease   CORONARY ANGIOPLASTY WITH STENT PLACEMENT     FRACTURE SURGERY     INSERTION OF ILIAC STENT Left 11/30/2017   Left external illiac stent   INSERTION OF ILIAC STENT  11/30/2017   Procedure: Insertion Of Iliac Stent;  Surgeon: Lorretta Harp, MD;  Location: [place] CV LAB;  Service: Cardiovascular;;  Left external illiac stent   KNEE ARTHROSCOPY Left    KNEE SURGERY     "ligaments, cartilage; tendon, put a pin in" (11/30/2017)   LEFT HEART CATH Bilateral 07/08/2012   Procedure: LEFT HEART CATH;  Surgeon: [name], MD;  Location: [facility] CATH LAB;  Service: Cardiovascular;  Laterality: Bilateral;   LEFT HEART CATHETERIZATION WITH CORONARY ANGIOGRAM N/A 09/06/2013   STEMI, 2nd ISR LAD. Procedure: LEFT HEART CATHETERIZATION WITH CORONARY ANGIOGRAM;  Surgeon: [name], MD;  Location: [facility] CATH LAB;  Service: Cardiovascular;  Laterality: N/A;   LOWER EXTREMITY ANGIOGRAPHY N/A 11/30/2017   Procedure: LOWER EXTREMITY ANGIOGRAPHY;  Surgeon: Lorretta Harp, MD;  Location: [place] CV LAB;  Service: Cardiovascular;  Laterality: N/A;   PERCUTANEOUS CORONARY STENT  INTERVENTION (PCI-S)  07/08/2012   Procedure: PERCUTANEOUS CORONARY STENT INTERVENTION (PCI-S);  Surgeon: [name], MD;  Location: [facility] CATH LAB;  Service: Cardiovascular;;  DES LAD   PERCUTANEOUS CORONARY STENT INTERVENTION (PCI-S) N/A 09/06/2013   Procedure: PERCUTANEOUS CORONARY STENT INTERVENTION (PCI-S);  Surgeon: [name], MD;  Location: [facility] CATH LAB;  Service: Cardiovascular;  Laterality: N/A;  Mid LAD 3.0/24mm Promus   WRIST FRACTURE SURGERY Bilateral      Home Medications:  Prior to Admission medications   Medication Sig Start Date End Date Taking? Authorizing Provider  aspirin EC 81 MG tablet Take 81 mg by mouth daily.    Yes [provider]  clopidogrel (PLAVIX) 75 MG tablet Take 1 tablet (75 mg total) by mouth daily with breakfast. 12/01/17  Yes Kathyrn Drown D, NP  insulin glargine (LANTUS) 100 UNIT/ML injection Inject 30 Units into the skin at  bedtime.    Yes [provider]  metFORMIN (GLUCOPHAGE) 1000 MG tablet Take 1 tablet (1,000 mg total) by mouth 2 (two) times daily with a meal. Do not restart Metformin until Wednesday am 7/2 Patient taking differently: Take 1,000 mg by mouth 2 (two) times daily with a meal.  07/10/12  Yes Turner, Eber Hong, MD  nitroGLYCERIN (NITROSTAT) 0.4 MG SL tablet Place 1 tablet (0.4 mg total) under the tongue every 5 (five) minutes x 3 doses as needed for chest pain. 10/05/18  Yes Herminio Commons, MD  HYDROcodone-acetaminophen (NORCO) 5-325 MG tablet Take 1 tablet by mouth every 6 (six) hours as needed for severe pain. 12/09/17   Julianne Rice, MD    Inpatient Medications: Scheduled Meds:  aspirin EC  81 mg Oral Daily   gabapentin  100 mg Oral TID   insulin aspart  0-5 Units Subcutaneous QHS   insulin aspart  0-9 Units Subcutaneous TID WC   insulin glargine  20 Units Subcutaneous QHS   metoprolol succinate  25 mg Oral Daily   pantoprazole  40 mg Oral Daily   pravastatin  80 mg Oral q1800    vitamin B-12  1,000 mcg Oral BID   Continuous Infusions:  PRN Meds: acetaminophen, morphine injection, nitroGLYCERIN  Allergies:   No Known Allergies  Social History:   Social History   Socioeconomic History   Marital status: Divorced    Spouse name: Not on file   Number of children: Not on file   Years of education: Not on file   Highest education level: Not on file  Occupational History   Not on file  Social Needs   Financial resource strain: Not on file   Food insecurity    Worry: Not on file    Inability: Not on file   Transportation needs    Medical: Not on file    Non-medical: Not on file  Tobacco Use   Smoking status: Former Smoker    Packs/day: 0.50    Years: 48.00    Pack years: 24.00    Types: Cigarettes   Smokeless tobacco: Never Used  Substance and Sexual Activity   Alcohol use: Yes    Comment: 11/30/2017 "might drink a beer q 6 months"   Drug use: No    Types: Cocaine    Comment: 11/30/2017 "last cocaine was ~ 1 wk ago"   Sexual activity: Yes  Lifestyle   Physical activity    Days per week: Not on file    Minutes per session: Not on file   Stress: Not on file  Relationships   Social connections    Talks on phone: Not on file    Gets together: Not on file    Attends religious service: Not on file    Active member of club or organization: Not on file    Attends meetings of clubs or organizations: Not on file    Relationship status: Not on file   Intimate partner violence    Fear of current or ex partner: Not on file    Emotionally abused: Not on file    Physically abused: Not on file    Forced sexual activity: Not on file  Other Topics Concern   Not on file  Social History Narrative   ** Merged History Encounter **         Family History:    Family History  Problem Relation Age of Onset   Hypertension Mother    Diabetes Mother  Review of Systems    General:  No chills, fever, night sweats or weight  changes.  Cardiovascular:  No edema, orthopnea, palpitations, paroxysmal nocturnal dyspnea. Positive for chest pain, left arm pain and dyspnea on exertion.  Dermatological: No rash, lesions/masses Respiratory: No cough, dyspnea Urologic: No hematuria, dysuria Abdominal:   No nausea, vomiting, diarrhea, bright red blood per rectum, melena, or hematemesis Neurologic:  No visual changes, wkns, changes in mental status. All other systems reviewed and are otherwise negative except as noted above.  Physical Exam/Data    Vitals:   10/15/18 1503 10/15/18 2037 10/15/18 2218 10/16/18 0507  BP: 109/68 112/75 107/62 116/77  Pulse: 73 71 69 65  Resp: 18 (!) 21 20 16   Temp: 97.9 F (36.6 C) 98.2 F (36.8 C) 98 F (36.7 C) 98.3 F (36.8 C)  TempSrc: Oral Oral Oral Oral  SpO2: 96% 97% 98% 96%  Weight:      Height:        Intake/Output Summary (Last 24 hours) at 10/16/2018 0920 Last data filed at 10/15/2018 1700 Gross per 24 hour  Intake 480 ml  Output --  Net 480 ml   Filed Weights   10/15/18 0142 10/15/18 1112  Weight: 110.2 kg 109.7 kg   Body mass index is 35.71 kg/m.   General: Pleasant male appearing in NAD Psych: Normal affect. Neuro: Alert and oriented X 3. Moves all extremities spontaneously. HEENT: Normal  Neck: Supple without bruits or JVD. Lungs:  Resp regular and unlabored, CTA without wheezing or rales. Heart: RRR no s3, s4, or murmurs. Abdomen: Soft, non-tender, non-distended, BS + x 4.  Extremities: No clubbing, cyanosis or lower extremity edema. DP/PT/Radials 2+ and equal bilaterally.   EKG:  The EKG was personally reviewed and demonstrates: NSR, HR 81 with inferior infarct pattern and TWI along lateral leads.   Telemetry:  Telemetry was personally reviewed and demonstrates: NSR, HR in 60's to 70's with occasional PVC's (couplets at times).    Labs/Studies     Relevant CV Studies:  Cardiac Catheterization: 09/07/2015  Mid RCA lesion, 99 %stenosed. Culprit  lesion  A STENT SYNERGY DES 3X28 drug eluting stent was successfully placed. Post intervention, there is a 0% residual stenosis.  Distal RCA at the bifurcation, 80% stenosis.Ost RPDA lesion, 70 %stenosed. - Similar to prior procedure  Ostial LAD ~30% followed by STENT in Prox LAD to Mid LAD Stent - 30 %stenosed. Dist LAD lesion, 65 %stenosed.  Ost 2nd Diag lesion, 80 %stenosed. Jailed by stent  1st Mrg-1 lesion, 60 %stenosed. 1st Mrg-2 lesion, 90 %stenosed. - Similar to prior procedure  There is severe left ventricular systolic dysfunction. The left ventricular ejection fraction is 25-35% by visual estimate. With near akinesis of the apex and severe hypokinesis of the mid anterior and mid inferior walls.  LV end diastolic pressure is severely elevated.  There is mild (2+) mitral regurgitation.   The patient's coronary artery disease has progressed with significant progression in the mid RCA now to 99% stenosis. The distal RCA and PDA lesions are stable. The OM1 lesions are stable.  The LAD stent is patent but with mild in-stent restenosis. There is distal LAD disease.  Successful DES PCI to the mid RCA.  Plan:  Transfer to [facility] post procedure unit for sheath removal with manual pressure.  Restart Effient (could change to Plavix if necessary) - for minimum one year. Continue aspirin  Continue statin, consider increasing dose for discharge.  Relook at echocardiogram, because EF by LV  gram was notably lower than echo.  Continue aggressive risk factor modification: Smoking cessation counseling, cocaine abuse counseling, diabetes management, hypertension management and lipid management.  Cardiac Catheterization: 01/2018    Laboratory Data:  Chemistry Recent Labs  Lab 10/15/18 0153  NA 136  K 3.7  CL 102  CO2 25  GLUCOSE 184*  BUN 10  CREATININE 1.00  CALCIUM 8.5*  GFRNONAA >60  GFRAA >60  ANIONGAP 9    Recent Labs  Lab 10/15/18 0153  PROT 7.5  ALBUMIN 3.5  AST  14*  ALT 11  ALKPHOS 89  BILITOT 0.5   Hematology Recent Labs  Lab 10/15/18 0153  WBC 10.9*  RBC 5.38  HGB 13.6  HCT 43.4  MCV 80.7  MCH 25.3*  MCHC 31.3  RDW 18.0*  PLT 283   Cardiac EnzymesNo results for input(s): TROPONINI in the last 168 hours. No results for input(s): TROPIPOC in the last 168 hours.  BNP Recent Labs  Lab 10/15/18 0153  BNP 194.0*    DDimer  Recent Labs  Lab 10/15/18 0153  DDIMER 0.32    Radiology/Studies:  Ct Head Wo Contrast  Result Date: 10/15/2018 CLINICAL DATA:  Focal neuro deficit with stroke suspected. Left thumb numbness tonight EXAM: CT HEAD WITHOUT CONTRAST TECHNIQUE: Contiguous axial images were obtained from the base of the skull through the vertex without intravenous contrast. COMPARISON:  08/22/2012 FINDINGS: Brain: No evidence of acute infarction, hemorrhage, hydrocephalus, extra-axial collection or mass lesion/mass effect. Discrete and remote appearing lacunar infarcts at the right internal capsule and left basal ganglia on coronal reformats. Mild patchy low-density in the cerebral white matter. Vascular: Atherosclerotic calcification. Skull: Normal. Negative for fracture or focal lesion. Sinuses/Orbits: Negative IMPRESSION: 1. No acute finding. 2. Chronic small vessel ischemia. Electronically Signed   By: Monte Fantasia M.D.   On: 10/15/2018 04:31   Ct Cervical Spine Wo Contrast  Result Date: 10/15/2018 CLINICAL DATA:  Intermittent pain and numbness in the left arm for 1 week. No known injury. EXAM: CT CERVICAL SPINE WITHOUT CONTRAST TECHNIQUE: Multidetector CT imaging of the cervical spine was performed without intravenous contrast. Multiplanar CT image reconstructions were also generated. COMPARISON:  Plain film cervical spine 11/21/2009. FINDINGS: Alignment: Maintained mild reversal of lordosis noted. Skull base and vertebrae: No acute fracture. No primary bone lesion or focal pathologic process. Soft tissues and spinal canal: No  prevertebral fluid or swelling. No visible canal hematoma. There is a fat containing lesion in the subcutaneous soft tissues of the posterior neck on the left measuring 3.5 cm craniocaudal by 4 cm transverse by 2.8 cm AP. The lesion contains soft tissue components and there is some surrounding stranding. It abuts the trapezius. Disc levels: Mild loss of disc space height and endplate spurring are seen from C3-C7 without obvious central canal stenosis. Scattered mild-to-moderate uncovertebral spurring is most notable on the left at C5-6. Upper chest: Lung apices clear. Other: None. IMPRESSION: No acute finding. Lipomatous lesion in the subcutaneous soft tissues of the left neck is worrisome for liposarcoma but could be due to fat necrosis. MRI with and without contrast is recommended for further evaluation. Mild appearing cervical spondylosis. No central canal narrowing by CT scan. Electronically Signed   By: Inge Rise M.D.   On: 10/15/2018 09:02   Dg Chest Portable 1 View  Result Date: 10/15/2018 CLINICAL DATA:  Chest pain EXAM: PORTABLE CHEST 1 VIEW COMPARISON:  11/03/2016 FINDINGS: Cardiomegaly with mild central congestion. No pleural effusion or focal consolidation. No pneumothorax.  IMPRESSION: Cardiomegaly with mild central congestion Electronically Signed   By: Donavan Foil M.D.   On: 10/15/2018 02:21   Ct Angio Chest/abd/pel For Dissection W And/or Wo Contrast  Result Date: 10/15/2018 CLINICAL DATA:  Chest pain with acute aortic syndrome suspected EXAM: CT ANGIOGRAPHY CHEST, ABDOMEN AND PELVIS TECHNIQUE: Multidetector CT imaging through the chest, abdomen and pelvis was performed using the standard protocol during bolus administration of intravenous contrast. Multiplanar reconstructed images and MIPs were obtained and reviewed to evaluate the vascular anatomy. CONTRAST:  179mL OMNIPAQUE IOHEXOL 350 MG/ML SOLN COMPARISON:  Renal stone CT 11/24/2016 FINDINGS: CTA CHEST FINDINGS Cardiovascular:  Cardiomegaly without pericardial effusion. There may be left ventricular thinning at the apex from prior infarct. Multifocal coronary atherosclerotic calcification with probable stenting. No evidence of intramural hematoma. No aortic aneurysm or dissection. No pulmonary artery filling defect. Mediastinum/Nodes: Generalized generous sized lymph nodes which may be congestive in this setting. Lungs/Pleura: Right upper lobe atelectasis or scarring. Symmetric interlobular septal thickening at the lung bases. Mosaic attenuation of the lungs which could be from edema or small airways disease. Musculoskeletal: Mid to lower thoracic spondylosis and advanced degenerative disc narrowing. Review of the MIP images confirms the above findings. CTA ABDOMEN AND PELVIS FINDINGS VASCULAR Aorta: Calcified and noncalcified plaque. No aneurysm or dissection. Celiac: Negative. SMA: Mild atherosclerotic plaque. No branch occlusion or beading. Renals: Single bilateral renal arteries which are smooth and widely patent. IMA: Patent. Inflow: Extensive atherosclerotic plaque. There is a left external iliac stent which is patent. Dilated common iliac arteries measuring up to 2.3 cm on the right, nonprogressive. Veins: Unremarkable in the arterial phase Review of the MIP images confirms the above findings. NON-VASCULAR Hepatobiliary: No focal liver abnormality.No evidence of biliary obstruction or stone. Pancreas: Unremarkable. Spleen: Unremarkable. Adrenals/Urinary Tract: Negative adrenals. No hydronephrosis or stone. Patchy renal cortical scarring on the right. Unremarkable bladder. Stomach/Bowel: No obstruction. No evidence of bowel inflammation. Internal hemorrhoids. Lymphatic: No mass or adenopathy. Reproductive:Negative Other: No ascites or pneumoperitoneum. Musculoskeletal: No acute abnormalities. Advanced lower lumbar degenerative disease. Review of the MIP images confirms the above findings. IMPRESSION: 1. No evidence of acute aortic  syndrome.  No aortic aneurysm. 2. Cardiomegaly with probable remote transmural infarct at the left ventricular apex. Mild pulmonary edema. 3. Aortoiliac atherosclerosis with mild aneurysmal enlargement of the common iliac arteries measuring up to 2.4 cm on the right. 4. Generous sized thoracic lymph nodes, possibly congestive in this setting. Electronically Signed   By: Monte Fantasia M.D.   On: 10/15/2018 04:43     Assessment & Plan    1. Chest Pain with Known CAD - he is s/p prior LAD stenting, DES to RCA in 08/2015, patent stent by repeat cath in 04/2016 with diffuse nonobstructive CAD along distal-LAD, OM2 and PDA. Most recent cath in 01/2018 showed 30% ostial LAD 30% stenosis, mild in-stent restenosis in the previously placed stent, 80% mid LAD stenosis which appeared to be diffusely diseased distally, 80% mid-LCx, and 80% OM1 and 50 % Prox-RCA with 80% distal-RCA stenosis. Not felt to be CABG candidate per CT Surgery at the [facility] and high-risk PCI was not pursued either as his symptoms at that time were thought to be consistent with GERD.  - Initial and delta HS Troponin values negative at 6.00 and 5.00. EKG on admission showed NSR, HR 81 with inferior infarct pattern and TWI along lateral leads but repeat tracing this AM shows TWI along the inferior leads which is more prominent now when compared  to prior tracings. Will recheck HS Troponin.  - given his progressive chest pain and left arm pain concerning for unstable angina, will discuss re-look cath with Dr. [name]. Last dose of Xarelto was Sunday. If repeat cath not pursued, would recommend trial of Imdur but would follow BP closely. Continue ASA, statin and BB therapy.   2. Ischemic Cardiomyopathy/Apical Thrombus - diagnosed by echo earlier this year and started on Xarelto for anticoagulation due to concerns with INR follow-up for Coumadin. EF 40-45% at that time. On BB therapy but not on ACE-I/ARB due to soft BP.  - repeat echocardiogram  being obtained at this time. Xarelto currently held in case invasive procedures are indicated. Will discuss with Dr. [name] Bound in regards to starting Heparin if echo not read until later today.   3. HLD - intolerant to Atorvastatin and Crestor in the past. Started on Pravastatin 80mg  daily by the [place] earlier this year and overall tolerating well. AST 14 and ALT 11. Will recheck FLP and order PTA statin.   4. HTN - BP well-controlled at 105/71 - 130/87 within the past 24 hours. Restart PTA Toprol-XL 25mg  daily (he denies any recent Cocaine use - UDS pending).  5. IDDM - PTA Metformin held. Continue Lantus and SSI.   6. PVD -  s/p PTA and covered stenting of left proximal left external iliac artery in 11/2017. Followed by Dr. Gwenlyn Found. Remains on ASA 81mg  daily. No longer on Plavix given the need for anticoagulation. He needs to reestablish with Dr. Gwenlyn Found as an outpatient.      For questions or updates, please contact [place] Please consult [email or web address] for contact info under Cardiology/STEMI.  Signed, Erma Heritage, PA-C 10/16/2018, 9:20 AM Pager: [phone]  The patient was seen and examined, and I agree with the history, physical exam, assessment and plan as documented above, with modifications as noted below. I have also personally reviewed all relevant documentation, old records, labs, and both radiographic and cardiovascular studies. I have also independently interpreted old and new ECG's.  Please refer to my office note dated 10/05/2018 which includes a summarization of extensive records which I reviewed from the [facility] describing his hospitalizations in January 2020.  He presents to [facility] with retrosternal chest discomfort radiating down his left arm and left wrist as well as pain to both big toes.  Several ECGs reviewed.  He has dynamic inferior T wave inversions noted shortly after 5 this morning and also upon presentation to the ED.  High-sensitivity  troponins are normal.  Diabetes is very poorly controlled with A1c 11.3%.  Echocardiogram has just been performed and is pending interpretation.  Overall presentation appears to be consistent with unstable angina.  Last dose of Xarelto was Sunday.  We will start IV heparin with plans for transfer to [facility] for coronary angiography.  Continue aspirin, pravastatin, and Toprol-XL.   [name], MD, [facility]  10/16/2018 9:51 AM

## 2018-10-16 NOTE — Progress Notes (Signed)
  Echocardiogram 2D Echocardiogram has been performed.  [name] 10/16/2018, 9:23 AM

## 2018-10-17 ENCOUNTER — Encounter (HOSPITAL_COMMUNITY): Payer: Self-pay | Admitting: Cardiovascular Disease

## 2018-10-17 DIAGNOSIS — E1149 Type 2 diabetes mellitus with other diabetic neurological complication: Secondary | ICD-10-CM

## 2018-10-17 DIAGNOSIS — Z955 Presence of coronary angioplasty implant and graft: Secondary | ICD-10-CM

## 2018-10-17 DIAGNOSIS — R072 Precordial pain: Secondary | ICD-10-CM

## 2018-10-17 LAB — BASIC METABOLIC PANEL
Anion gap: 9 (ref 5–15)
BUN: 9 mg/dL (ref 8–23)
CO2: 26 mmol/L (ref 22–32)
Calcium: 8.5 mg/dL — ABNORMAL LOW (ref 8.9–10.3)
Chloride: 100 mmol/L (ref 98–111)
Creatinine, Ser: 1.21 mg/dL (ref 0.61–1.24)
GFR calc Af Amer: >60 mL/min (ref >60)
GFR calc non Af Amer: >60 mL/min (ref >60)
Glucose, Bld: 154 mg/dL — ABNORMAL HIGH (ref 70–99)
Potassium: 3.9 mmol/L (ref 3.5–5.1)
Sodium: 135 mmol/L (ref 135–145)

## 2018-10-17 LAB — CBC
HCT: 42.6 % (ref 39.0–52.0)
Hemoglobin: 13.9 g/dL (ref 13.0–17.0)
MCH: 25.9 pg — ABNORMAL LOW (ref 26.0–34.0)
MCHC: 32.6 g/dL (ref 30.0–36.0)
MCV: 79.3 fL — ABNORMAL LOW (ref 80.0–100.0)
Platelets: 266 10*3/uL (ref 150–400)
RBC: 5.37 MIL/uL (ref 4.22–5.81)
RDW: 17.3 % — ABNORMAL HIGH (ref 11.5–15.5)
WBC: 9.8 10*3/uL (ref 4.0–10.5)
nRBC: 0 % (ref 0.0–0.2)

## 2018-10-17 LAB — GLUCOSE, CAPILLARY
Glucose-Capillary: 141 mg/dL — ABNORMAL HIGH (ref 70–99)
Glucose-Capillary: 198 mg/dL — ABNORMAL HIGH (ref 70–99)

## 2018-10-17 MED ORDER — PANTOPRAZOLE SODIUM 40 MG PO TBEC: 40.0000 mg | DELAYED_RELEASE_TABLET | Freq: Every day | ORAL | 3 refills | Status: DC

## 2018-10-17 MED ORDER — ASPIRIN EC 81 MG PO TBEC: 81.0000 mg | DELAYED_RELEASE_TABLET | Freq: Every day | ORAL | 3 refills | Status: AC

## 2018-10-17 MED ORDER — ASPIRIN EC 81 MG PO TBEC: 81.0000 mg | DELAYED_RELEASE_TABLET | Freq: Every day | ORAL | 3 refills | Status: DC

## 2018-10-17 MED ORDER — METOPROLOL SUCCINATE ER 25 MG PO TB24: 25.0000 mg | ORAL_TABLET | Freq: Every day | ORAL | 3 refills | Status: DC

## 2018-10-17 MED ORDER — PRAVASTATIN SODIUM 80 MG PO TABS: 80.0000 mg | ORAL_TABLET | Freq: Every day | ORAL | 3 refills | Status: DC

## 2018-10-17 MED ORDER — CLOPIDOGREL BISULFATE 75 MG PO TABS: 75.0000 mg | ORAL_TABLET | Freq: Every day | ORAL | 3 refills | Status: DC

## 2018-10-17 MED FILL — Verapamil HCl IV Soln 2.5 MG/ML: INTRAVENOUS | Qty: 2 | Status: AC

## 2018-10-17 MED FILL — PANTOPRAZOLE SOD DR 40 MG T: 40 | 90 days supply | Qty: 90 | Fill #0

## 2018-10-17 MED FILL — PRAVASTATIN SODIUM 80 MG TA: 80 | 90 days supply | Qty: 90 | Fill #0

## 2018-10-17 MED FILL — ASPIRIN 81MG ADULT LOW STRE: 81 | 90 days supply | Qty: 90 | Fill #0

## 2018-10-17 MED FILL — CLOPIDOGREL 75 MG TABLET: 75 | 90 days supply | Qty: 90 | Fill #0

## 2018-10-17 MED FILL — METOPROLOL SUCCINATE ER 25: 25 | 90 days supply | Qty: 90 | Fill #0

## 2018-10-17 NOTE — Progress Notes (Signed)
Pt refused ambulation and most of education. I was able to talk with him about NTG the most thoroughly. He sts he has a fresh bottle at home. He did not understand that his arm sensation was angina. Pt adamantly sts that he will only take 6 pills, regardless if Plavix is one of them. It does look like he was on it at home. Not receptive to discussion. Refused diet and ex discussion. Accepted stent card. Will refer to [facility] however he is not interested.  [place], ACSM 9:20 AM 10/17/2018

## 2018-10-17 NOTE — Discharge Summary (Signed)
Discharge Summary    Patient ID: [name] MRN: [id number]; DOB: 07-09-1955  Admit date: 10/15/2018 Discharge date: 10/17/2018  Primary Care Provider: System, Pcp Not In  Primary Cardiologist: [name], MD  Primary Electrophysiologist:  None   Discharge Diagnoses    Principal Problem:   Precordial chest pain Active Problems:   Hypertensive heart disease   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   CAD (coronary artery disease)   Essential hypertension   Status post coronary artery stent placement   Chest pain   Apical mural thrombus   Unstable angina (HCC)   Allergies No Known Allergies  Diagnostic Studies/Procedures    CORONARY STENT INTERVENTION  LEFT HEART CATH AND CORONARY ANGIOGRAPHY 10/16/2018  Conclusion   Ost 2nd Diag lesion is 80% stenosed.  1st Mrg-1 lesion is 99% stenosed.  1st Mrg-2 lesion is 90% stenosed.  Dist RCA lesion is 80% stenosed.  Ost RPDA lesion is 70% stenosed.  Balloon angioplasty was performed.  RPDA lesion is 90% stenosed.  Prox RCA lesion is 40% stenosed.  Mid RCA lesion is 10% stenosed.  Prox LAD to Mid LAD lesion is 10% stenosed.  Prox LAD lesion is 40% stenosed.  Dist LAD-1 lesion is 65% stenosed.  Dist LAD-2 lesion is 99% stenosed.  Mid LAD to Dist LAD lesion is 40% stenosed.  Ramus lesion is 90% stenosed.  Mid Cx to Dist Cx lesion is 90% stenosed.  A drug-eluting stent was successfully placed.  Post intervention, there is a 0% residual stenosis.   1. Triple vessel CAD with diffuse, diabetic appearance of the coronary arteries 2. The LAD has a mild to moderate proximal stenosis which is unchanged from last cath. The mid LAD stent is patent with minimal restenosis. The distal LAD has diffuse severe disease, unchanged from last cath. This is not a suitable vessel for PCI distally.  3. The Circumflex has severe diffuse disease in the obtuse marginal branch which is unchanged from the last cath and  not suitable for PCI. The distal Circumflex has diffuse disease.  4. The Ramus Intermediate branch is a moderate to large caliber vessel with a severe mid stenosis 5. The RCA is a large, dominant vessel with mild to moderate proximal stenosis. The mid stented segment is patent with minimal restenosis. The distal vessel has chronic severe disease just before the bifurcation into the relatively small caliber PDA and posterolateral arteries. The PDA has diffuse severe disease but is too small for PCI.  6. Successful PTCA/DES x 1 Ramus intermediate branch  Recommendations: ASA and Plavix for one month along with Xarelto. I would stop ASA at one month and continue Plavix along with Xarelto at that time.     Diagnostic Dominance: Right  Intervention    _____________  Echocardiogram 10/16/2018 IMPRESSIONS    1. Left ventricular ejection fraction, by visual estimation, is 40%. The left ventricle has moderately decreased function. Normal left ventricular size. There is mildly increased left ventricular hypertrophy. No obvious apical thrombus. Consider limited  echo with contrast for more optimal apical visualization.  2. Multiple segmental abnormalities exist. See findings.  3. Elevated left ventricular end-diastolic pressure.  4. Left ventricular diastolic Doppler parameters are consistent with pseudonormalization pattern of LV diastolic filling.  5. Global right ventricle has normal systolic function.The right ventricular size is normal. No increase in right ventricular wall thickness.  6. Left atrial size was normal.  7. Right atrial size was normal.  8. Mild to moderate aortic valve annular calcification.  9. The mitral valve is grossly normal. Mild mitral valve regurgitation. 10. The tricuspid valve is grossly normal. Tricuspid valve regurgitation is trivial. 11. The aortic valve is tricuspid Aortic valve regurgitation was not visualized by color flow Doppler. Mild aortic valve  sclerosis without stenosis. 12. The pulmonic valve was grossly normal. Pulmonic valve regurgitation is not visualized by color flow Doppler. 13. The inferior vena cava is normal in size with greater than 50% respiratory variability, suggesting right atrial pressure of 3 mmHg.   History of Present Illness     [name] is a 63 y.o. male with past medical history of CAD (s/p prior LAD stenting, DES to RCA in 08/2015, patent stent by repeat cath in 04/2016 with diffuse nonobstructive CAD along distal-LAD, OM2 and PDA), PDA (s/p PTA and covered stenting of left proximal left external iliac artery in 11/2017), ischemic cardiomyopathy (EF 45-50% by echo in 08/2015), history of cocaine use, HTN, HLD, and IDDM.   [name] was recently examined by Dr. [name] in 09/2018 and reported having a cardiac catheterization earlier in the year at [facility] and was told he might need CABG but was later informed he "had a clot in his heart" but did not require stenting. Records were received and by review of them he had an apical thrombus by echo in 01/2018 and EF was 40-45%. Had a cardiac catheterization on 02/09/2018 which showed 30% ostial LAD 30% stenosis, mild in-stent restenosis in the previously placed stent, 80% mid LAD stenosis which appeared to be diffusely diseased distally, 80% mid-LCx, and 80% OM1 and 50 % Prox-RCA with 80% distal-RCA stenosis. Was evaluated by CT Surgery and not felt to be a surgical candidate and high-risk PCI was not pursued either as his symptoms at that time were thought to be consistent with GERD. Did require I&D of a right groin abscess during admission. Was started on Xarelto 20mg  daily, statin, and BB therapy. Prior notes mention Effient but the patient says he was never on this.   He presented to [facility] ED on 10/15/2018 for evaluation of chest pain for the past several weeks. In talking with the patient, he described episodes of chest pain occurring for the past 4-6  weeks which can occur at rest or with activity.  He also noted worsening dyspnea on exertion. The most concerning part to him is worsening pain down his left arm which resembles when he required stenting in the past.  He said the pain radiates all the way down to his left wrist.  He also reported intermittent pain along his lower extremities which can occur at rest or with activity.  He denied any specific orthopnea, PND, lower extremity edema, or palpitations.  He was unsure of what medications he was currently taking and we were able to get in touch with someone in his home who confirmed he is taking ASA, Toprol-XL 25mg  daily, Pravastatin 80mg  daily, Protonix, Metformin and Xarelto. Says he is no longer on Effient or Plavix.   Initial labs show WBC 10.9, Hgb 13.6, platelets 283, Na+ 136, K+ 3.7, and creatinine 1.00. Initial and delta HS Troponin values negative at 6.00 and 5.00. BNP 194. TSH 2.759. COVID negative. CXR showed cardiomegaly with mild central congestion. CT Head performed due to left arm weakness and showed small vessel ischemia with no acute abnormalities. EKG shows NSR, HR 81 with inferior infarct pattern and TWI along lateral leads.   He had recurrent chest pain overnight and repeat tracing showed TWI  along the inferior leads more prominent when compared to prior tracings.   Pt was transferred to [facility] for cardiac catheterization.   Hospital Course     Consultants: None  Mr. Doble underwent left heart cath on 10/16/2018, see report above, finding of triple-vessel CAD with diffuse, diabetic appearance of the coronary arteries.  The patient has multiple severe blockages which are unchanged from prior studies.  He did have successful placement of a drug-eluting stent to the ramus intermediate branch.  Recommendation is for aspirin and Plavix for 1 month along with Xarelto (for LV thrombus).  Aspirin should be stopped at 1 month and continue Plavix along with Xarelto at  that time. After discussion with the patient he is very resistant to adding another medication. He says that 6 pills is all he is willing to take as when he adds more he develops problems. Upon review of his echo by Dr. Debara Pickett there is no apical thrombus present so will stop Xarelto and continue aspirin and Plavix.   Right groin is stable, soft, without hematoma.  Pt needs risk factor modification with cholesterol management, smoking cessation, avoidance of cocaine, hypertension management and diabetes management.  Notes indicate that the patient is intolerant to atorvastatin and Crestor in the past.  He was started on pravastatin 80 mg daily by the [place] earlier this year and overall tolerating well.  LDL was 114 on 10/16/2018.  The patient could be considered for PCSK9 inhibitor therapy if this has not been accomplished.  Possibly could be done through the [place].  He has uncontrolled diabetes with A1c of 11.3 on 10/15/2018.  Advise improved diabetes management for his overall health outcomes.  The patient states that he continues to smoke, once in a while, unable to quantify, likely most days.  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  He does not see any way that he will be able to fully achieve smoking cessation.  I advised him that smoking is 1 of the strongest risk factors for developing these blockages in his heart as well as peripheral artery disease.  He will need to follow up with Dr. Gwenlyn Found for PAD.   Patient has been seen by Dr. Debara Pickett today and deemed ready for discharge home. All follow up appointments have been scheduled. Discharge medications are listed below.   Toprol-XL 25mg  daily, Pravastatin 80mg  daily, Protonix, Metformin and Xarelto. Says he is no longer on Effient or Plavix.  _____________  Discharge Vitals Blood pressure 118/78, pulse 68, temperature 98.3 F (36.8 C), temperature source Oral,  resp. rate 18, height 5\' 9"  (1.753 m), weight 108.4 kg, SpO2 95 %.  Filed Weights   10/15/18 0142 10/15/18 1112 10/17/18 0430  Weight: 110.2 kg 109.7 kg 108.4 kg   Physical Exam  Constitutional: He is oriented to person, place, and time. No distress.  Obese male  HENT:  Head: Normocephalic and atraumatic.  Neck: Normal range of motion. Neck supple. No JVD present.  Cardiovascular: Normal rate, regular rhythm, normal heart sounds and intact distal pulses. Exam reveals no gallop and no friction rub.  No murmur heard. Pulmonary/Chest: Effort normal and breath sounds normal. No respiratory distress. He has no wheezes. He has no rales.  Abdominal: Soft. Bowel sounds are normal.  Musculoskeletal: Normal range of motion.        General: No edema.  Neurological: He is alert  and oriented to person, place, and time.  Skin: Skin is warm and dry.  Psychiatric: He has a normal mood and affect. His behavior is normal. Judgment and thought content normal.    Labs & Radiologic Studies    CBC Recent Labs    10/15/18 0153 10/17/18 0244  WBC 10.9* 9.8  NEUTROABS 8.0*  --   HGB 13.6 13.9  HCT 43.4 42.6  MCV 80.7 79.3*  PLT 283 [id number]   Basic Metabolic Panel Recent Labs    10/15/18 0153 10/17/18 0244  NA 136 135  K 3.7 3.9  CL 102 100  CO2 25 26  GLUCOSE 184* 154*  BUN 10 9  CREATININE 1.00 1.21  CALCIUM 8.5* 8.5*   Liver Function Tests Recent Labs    10/15/18 0153  AST 14*  ALT 11  ALKPHOS 89  BILITOT 0.5  PROT 7.5  ALBUMIN 3.5   No results for input(s): LIPASE, AMYLASE in the last 72 hours. High Sensitivity Troponin:   Recent Labs  Lab 10/15/18 0153 10/15/18 0339 10/16/18 0941  TROPONINIHS 6 5 4     BNP Invalid input(s): POCBNP D-Dimer Recent Labs    10/15/18 0153  DDIMER 0.32   Hemoglobin A1C Recent Labs    10/15/18 0845  HGBA1C 11.3*   Fasting Lipid Panel Recent Labs    10/16/18 0941  CHOL 161  HDL 24*  LDLCALC 114*  TRIG 117  CHOLHDL 6.7    Thyroid Function Tests Recent Labs    10/15/18 0845  TSH 2.759   _____________  Ct Head Wo Contrast  Result Date: 10/15/2018 CLINICAL DATA:  Focal neuro deficit with stroke suspected. Left thumb numbness tonight EXAM: CT HEAD WITHOUT CONTRAST TECHNIQUE: Contiguous axial images were obtained from the base of the skull through the vertex without intravenous contrast. COMPARISON:  08/22/2012 FINDINGS: Brain: No evidence of acute infarction, hemorrhage, hydrocephalus, extra-axial collection or mass lesion/mass effect. Discrete and remote appearing lacunar infarcts at the right internal capsule and left basal ganglia on coronal reformats. Mild patchy low-density in the cerebral white matter. Vascular: Atherosclerotic calcification. Skull: Normal. Negative for fracture or focal lesion. Sinuses/Orbits: Negative IMPRESSION: 1. No acute finding. 2. Chronic small vessel ischemia. Electronically Signed   By: Monte Fantasia M.D.   On: 10/15/2018 04:31   Ct Cervical Spine Wo Contrast  Result Date: 10/15/2018 CLINICAL DATA:  Intermittent pain and numbness in the left arm for 1 week. No known injury. EXAM: CT CERVICAL SPINE WITHOUT CONTRAST TECHNIQUE: Multidetector CT imaging of the cervical spine was performed without intravenous contrast. Multiplanar CT image reconstructions were also generated. COMPARISON:  Plain film cervical spine 11/21/2009. FINDINGS: Alignment: Maintained mild reversal of lordosis noted. Skull base and vertebrae: No acute fracture. No primary bone lesion or focal pathologic process. Soft tissues and spinal canal: No prevertebral fluid or swelling. No visible canal hematoma. There is a fat containing lesion in the subcutaneous soft tissues of the posterior neck on the left measuring 3.5 cm craniocaudal by 4 cm transverse by 2.8 cm AP. The lesion contains soft tissue components and there is some surrounding stranding. It abuts the trapezius. Disc levels: Mild loss of disc space height and  endplate spurring are seen from C3-C7 without obvious central canal stenosis. Scattered mild-to-moderate uncovertebral spurring is most notable on the left at C5-6. Upper chest: Lung apices clear. Other: None. IMPRESSION: No acute finding. Lipomatous lesion in the subcutaneous soft tissues of the left neck is worrisome for liposarcoma but could be due to fat  necrosis. MRI with and without contrast is recommended for further evaluation. Mild appearing cervical spondylosis. No central canal narrowing by CT scan. Electronically Signed   By: Inge Rise M.D.   On: 10/15/2018 09:02   Dg Chest Portable 1 View  Result Date: 10/15/2018 CLINICAL DATA:  Chest pain EXAM: PORTABLE CHEST 1 VIEW COMPARISON:  11/03/2016 FINDINGS: Cardiomegaly with mild central congestion. No pleural effusion or focal consolidation. No pneumothorax. IMPRESSION: Cardiomegaly with mild central congestion Electronically Signed   By: Donavan Foil M.D.   On: 10/15/2018 02:21   Ct Angio Chest/abd/pel For Dissection W And/or Wo Contrast  Result Date: 10/15/2018 CLINICAL DATA:  Chest pain with acute aortic syndrome suspected EXAM: CT ANGIOGRAPHY CHEST, ABDOMEN AND PELVIS TECHNIQUE: Multidetector CT imaging through the chest, abdomen and pelvis was performed using the standard protocol during bolus administration of intravenous contrast. Multiplanar reconstructed images and MIPs were obtained and reviewed to evaluate the vascular anatomy. CONTRAST:  152mL OMNIPAQUE IOHEXOL 350 MG/ML SOLN COMPARISON:  Renal stone CT 11/24/2016 FINDINGS: CTA CHEST FINDINGS Cardiovascular: Cardiomegaly without pericardial effusion. There may be left ventricular thinning at the apex from prior infarct. Multifocal coronary atherosclerotic calcification with probable stenting. No evidence of intramural hematoma. No aortic aneurysm or dissection. No pulmonary artery filling defect. Mediastinum/Nodes: Generalized generous sized lymph nodes which may be congestive in  this setting. Lungs/Pleura: Right upper lobe atelectasis or scarring. Symmetric interlobular septal thickening at the lung bases. Mosaic attenuation of the lungs which could be from edema or small airways disease. Musculoskeletal: Mid to lower thoracic spondylosis and advanced degenerative disc narrowing. Review of the MIP images confirms the above findings. CTA ABDOMEN AND PELVIS FINDINGS VASCULAR Aorta: Calcified and noncalcified plaque. No aneurysm or dissection. Celiac: Negative. SMA: Mild atherosclerotic plaque. No branch occlusion or beading. Renals: Single bilateral renal arteries which are smooth and widely patent. IMA: Patent. Inflow: Extensive atherosclerotic plaque. There is a left external iliac stent which is patent. Dilated common iliac arteries measuring up to 2.3 cm on the right, nonprogressive. Veins: Unremarkable in the arterial phase Review of the MIP images confirms the above findings. NON-VASCULAR Hepatobiliary: No focal liver abnormality.No evidence of biliary obstruction or stone. Pancreas: Unremarkable. Spleen: Unremarkable. Adrenals/Urinary Tract: Negative adrenals. No hydronephrosis or stone. Patchy renal cortical scarring on the right. Unremarkable bladder. Stomach/Bowel: No obstruction. No evidence of bowel inflammation. Internal hemorrhoids. Lymphatic: No mass or adenopathy. Reproductive:Negative Other: No ascites or pneumoperitoneum. Musculoskeletal: No acute abnormalities. Advanced lower lumbar degenerative disease. Review of the MIP images confirms the above findings. IMPRESSION: 1. No evidence of acute aortic syndrome.  No aortic aneurysm. 2. Cardiomegaly with probable remote transmural infarct at the left ventricular apex. Mild pulmonary edema. 3. Aortoiliac atherosclerosis with mild aneurysmal enlargement of the common iliac arteries measuring up to 2.4 cm on the right. 4. Generous sized thoracic lymph nodes, possibly congestive in this setting. Electronically Signed   By: Monte Fantasia M.D.   On: 10/15/2018 04:43   Disposition   Pt is being discharged home today in good condition.  Follow-up Plans & Appointments    Follow-up Information    Erma Heritage, PA-C Follow up.   Specialties: Physician Assistant, Cardiology Why: Hospital follow-up on Friday, 10/26/2018 at 2:30 PM.  Please arrive 15 minutes early for check-in. Contact information: [place] [address] [phone]          Discharge Instructions    Amb Referral to Cardiac Rehabilitation   Complete by: As directed  Diagnosis:  Coronary Stents PTCA     After initial evaluation and assessments completed: Virtual Based Care may be provided alone or in conjunction with Phase 2 Cardiac Rehab based on patient barriers.: Yes   Diet - low sodium heart healthy   Complete by: As directed    Discharge instructions   Complete by: As directed    [place].  PLEASE ATTEND ALL SCHEDULED FOLLOW-UP APPOINTMENTS.   Activity: Increase activity slowly as tolerated. You may shower, but no soaking baths (or swimming) for 1 week. No driving for 24 hours. No lifting over 5 lbs for 1 week. No sexual activity for 1 week.   You May Return to Work: in 1 week (if applicable)  Wound Care: You may wash cath site gently with soap and water. Keep cath site clean and dry. If you notice pain, swelling, bleeding or pus at your cath site, please call [phone].   Increase activity slowly   Complete by: As directed       Discharge Medications   Allergies as of 10/17/2018   No Known Allergies     Medication List    TAKE these medications   aspirin EC 81 MG tablet Take 1 tablet (81 mg total) by mouth daily.   clopidogrel 75 MG tablet Commonly known as: PLAVIX Take 1 tablet (75 mg total) by mouth daily with breakfast. Start taking on: October 18, 2018   HYDROcodone-acetaminophen 5-325 MG tablet Commonly known  as: Norco Take 1 tablet by mouth every 6 (six) hours as needed for severe pain.   insulin glargine 100 UNIT/ML injection Commonly known as: LANTUS Inject 30 Units into the skin at bedtime.   metFORMIN 1000 MG tablet Commonly known as: GLUCOPHAGE Take 1 tablet (1,000 mg total) by mouth 2 (two) times daily with a meal. Do not restart Metformin until Wednesday am 7/2 What changed: additional instructions Notes to patient: Hold metformin on 10/7 and the morning of 10/8. May resume in the evening of 10/8.    metoprolol succinate 25 MG 24 hr tablet Commonly known as: TOPROL-XL Take 1 tablet (25 mg total) by mouth daily. Start taking on: October 18, 2018   nitroGLYCERIN 0.4 MG SL tablet Commonly known as: NITROSTAT Place 1 tablet (0.4 mg total) under the tongue every 5 (five) minutes x 3 doses as needed for chest pain.   pantoprazole 40 MG tablet Commonly known as: PROTONIX Take 1 tablet (40 mg total) by mouth daily. Start taking on: October 18, 2018   pravastatin 80 MG tablet Commonly known as: PRAVACHOL Take 1 tablet (80 mg total) by mouth daily at 6 PM.        Acute coronary syndrome (MI, NSTEMI, STEMI, etc) this admission?: Yes.     AHA/ACC Clinical Performance & Quality Measures: 1. Aspirin prescribed? - Yes 2. ADP Receptor Inhibitor (Plavix/Clopidogrel, Brilinta/Ticagrelor or Effient/Prasugrel) prescribed (includes medically managed patients)? - Yes 3. Beta Blocker prescribed? - Yes 4. High Intensity Statin (Lipitor 40-80mg  or Crestor 20-40mg ) prescribed? - No - Intolerant to atorvastatin and rosuvastatin. On Pravastatin (modertate intensity) 5. EF assessed during THIS hospitalization? - Yes 6. For EF <40%, was ACEI/ARB prescribed? - Not Applicable (EF >/= [id number]) 7. For EF <40%, Aldosterone Antagonist (Spironolactone or Eplerenone) prescribed? - Not Applicable (EF >/= [id number]) 8. Cardiac Rehab Phase II ordered (Included Medically managed Patients)? - Yes     Outstanding  Labs/Studies   none  Duration of  Discharge Encounter   Greater than 30 minutes including physician time.  Signed, Daune Perch, NP 10/17/2018, 11:39 AM

## 2018-10-17 NOTE — Discharge Instructions (Signed)
STOP TAKING XARELTO (RIVAROXABAN)   Lifestyle Modifications to Prevent and Treat Heart Disease -Recommend heart healthy/Mediterranean diet, with whole grains, fruits, vegetables, fish, lean meats, nuts, olive oil and avocado oil.  -Limit salt intake to less than 2000 mg per day.  -Recommend moderate walking, starting slowly with a few minutes and working up -Recommend avoidance of tobacco products. Avoid excess alcohol. -Keep blood pressure well controlled, ideally less than 130/80.  -Work on improved blood sugar control.

## 2018-10-17 NOTE — Consult Note (Signed)
   [facility] CM Inpatient Consult   10/17/2018  [name] 05-Aug-1955 [id number]    Referral received from Inpatient DM coordinator Deboraha Sprang) through [place] regarding DM management for A1c of 11.3.  Patientreviewed and noted with 15% medium risk score for unplanned readmission and hospitalization, under his [employer].  Called to speak to patient x 3 and was connected by nurses' station to his room (hangs up phone). Patient confirmed that he goes to [facility] in [place] and also uses [facility] as well.   Medical record review indicates that PCP is Sharion Balloon, fax [phone]  who [facility] provider and not affiliated with [employer] Patient verbalized not needing any help with his DM when mentioned of receiving a referral for his diabetes management and states, "I don't think I need it and I got to go."   Patient was foundNOTcurrently a beneficiary of the attributed [place] in the [employer].   Reason:The patient'sPCPis not a [facility] primary care provider and is not [facility] affiliated.  This patient is Noteligible for [facility] Care Management Services.    For questions and additional information, please call:  Shiela Bruns A. Arlean Thies, BSN, RN-BC [facility] [place] Liaison Cell: [phone]

## 2018-10-21 ENCOUNTER — Encounter (HOSPITAL_COMMUNITY): Payer: Self-pay | Admitting: Emergency Medicine

## 2018-10-21 ENCOUNTER — Other Ambulatory Visit: Payer: Self-pay

## 2018-10-21 ENCOUNTER — Emergency Department (HOSPITAL_COMMUNITY)
Admission: EM | Admit: 2018-10-21 | Discharge: 2018-10-22 | Disposition: A | Discharge status: Home / Self Care | Payer: No Typology Code available for payment source | Attending: Emergency Medicine | Admitting: Emergency Medicine

## 2018-10-21 ENCOUNTER — Emergency Department (HOSPITAL_COMMUNITY): Payer: No Typology Code available for payment source

## 2018-10-21 DIAGNOSIS — Z7982 Long term (current) use of aspirin: Secondary | ICD-10-CM | POA: Diagnosis not present

## 2018-10-21 DIAGNOSIS — M79602 Pain in left arm: Secondary | ICD-10-CM | POA: Diagnosis not present

## 2018-10-21 DIAGNOSIS — R221 Localized swelling, mass and lump, neck: Secondary | ICD-10-CM | POA: Insufficient documentation

## 2018-10-21 DIAGNOSIS — R079 Chest pain, unspecified: Secondary | ICD-10-CM | POA: Diagnosis not present

## 2018-10-21 DIAGNOSIS — Z87891 Personal history of nicotine dependence: Secondary | ICD-10-CM | POA: Insufficient documentation

## 2018-10-21 DIAGNOSIS — Z7984 Long term (current) use of oral hypoglycemic drugs: Secondary | ICD-10-CM | POA: Insufficient documentation

## 2018-10-21 DIAGNOSIS — E1122 Type 2 diabetes mellitus with diabetic chronic kidney disease: Secondary | ICD-10-CM | POA: Insufficient documentation

## 2018-10-21 DIAGNOSIS — N182 Chronic kidney disease, stage 2 (mild): Secondary | ICD-10-CM | POA: Diagnosis not present

## 2018-10-21 DIAGNOSIS — Z7901 Long term (current) use of anticoagulants: Secondary | ICD-10-CM | POA: Diagnosis not present

## 2018-10-21 DIAGNOSIS — I129 Hypertensive chronic kidney disease with stage 1 through stage 4 chronic kidney disease, or unspecified chronic kidney disease: Secondary | ICD-10-CM | POA: Insufficient documentation

## 2018-10-21 DIAGNOSIS — Z79899 Other long term (current) drug therapy: Secondary | ICD-10-CM | POA: Diagnosis not present

## 2018-10-21 DIAGNOSIS — R0602 Shortness of breath: Secondary | ICD-10-CM | POA: Diagnosis not present

## 2018-10-21 LAB — CBC
HCT: 43.8 % (ref 39.0–52.0)
Hemoglobin: 13.2 g/dL (ref 13.0–17.0)
MCH: 24.8 pg — ABNORMAL LOW (ref 26.0–34.0)
MCHC: 30.1 g/dL (ref 30.0–36.0)
MCV: 82.3 fL (ref 80.0–100.0)
Platelets: 323 10*3/uL (ref 150–400)
RBC: 5.32 MIL/uL (ref 4.22–5.81)
RDW: 18.5 % — ABNORMAL HIGH (ref 11.5–15.5)
WBC: 10 10*3/uL (ref 4.0–10.5)
nRBC: 0 % (ref 0.0–0.2)

## 2018-10-21 MED ORDER — SODIUM CHLORIDE 0.9% FLUSH: 3.0000 mL | Freq: Once | INTRAVENOUS | Status: DC

## 2018-10-21 MED ORDER — NITROGLYCERIN 2 % TD OINT
1.0000 [in_us] | TOPICAL_OINTMENT | Freq: Once | TRANSDERMAL | Status: AC
  Administered 2018-10-21: 1 [in_us] via TOPICAL
  Filled 2018-10-21: qty 1

## 2018-10-21 NOTE — ED Provider Notes (Signed)
[facility] EMERGENCY DEPARTMENT Provider Note   CSN: [id number] Arrival date & time: 10/21/18  2227   Time seen 11:10 PM  History   Chief Complaint Chief Complaint  Patient presents with   Chest Pain    HPI [name] is a 63 y.o. male.     HPI patient has a history of coronary artery disease who has a left ventricular aneurysm complicated by a clot diagnosed in March.  He was admitted on October 5 when he presented to the ED with chest pain and had a cardiac cath.  He had a stent placed in the ramus intermediate branch and was started on Plavix and aspirin for 1 month.  The cardiology note states he should continue his Xarelto however the patient has is back to medication with him and he does not have Xarelto and that.  He states he was discharged from the hospital on the seventh.  He states when he had presented with the discomfort he was having pain in his left shoulder that radiated down to his wrist.  He states he has been having that left arm pain off and on since he was discharged from the hospital and it would last a few hours at a time.  He states however today about 10 AM that throbbing pain became constant.  About 2:30 PM today he states he started having a "floater" pain in his anterior chest.  He states it was a "funny" pain.  He states it just did not feel right.  He states when he was admitted to the hospital earlier in the week the pain was a throbbing discomfort.  When asked to describe the pain tonight he states it was "flat" when given the choice of sharp, dull, achy, burning etc.  He states he has had some shortness of breath but denies nausea or vomiting.  He thinks he may have been sweaty because he turned on the air conditioning.  Patient states he called EMS and they gave him 1 nitroglycerin and 4 aspirin and states the nitroglycerin helped his chest pain tremendously however he still has some discomfort in his left arm.  Patient states he was started on  metoprolol ER 25 mg, Plavix, and pravastatin at his last hospital admission and he does have prescription bottles for those.  PCP System, Pcp Not In Cardiology Dr Jacinta Shoe  Past Medical History:  Diagnosis Date   Bulging lumbar disc    CAD (coronary artery disease) [phone]   a. prior LAD stenting. b. s/p DES to [facility] 08/2015 @ [facility]. c. 04/2016 Cardiac cath at [facility]. # vessel CAD, with patent stent in the PLAD and RCA. Diffuse dLAD, OM2, and  RPDA disease. No aortic stenosis. Elevated LVEDP. No lesion to suggest ACS. Continue medical managment.   Chronic lower back pain    CKD (chronic kidney disease), stage II    Cocaine abuse ([place])    DM2 (diabetes mellitus, type 2) (HCC)    GERD (gastroesophageal reflux disease)    Headache    "often; not regular" (11/30/2017)   HTN (hypertension)    Hyperlipidemia    Ischemic cardiomyopathy    MI, old    Obesity    Pneumonia    hx   Sleep apnea    "have machine; I don't use it" (11/30/2017)   Tobacco use     Patient Active Problem List   Diagnosis Date Noted   Unstable angina ([place]) 10/16/2018   Apical mural thrombus 10/15/2018  Claudication in peripheral vascular disease ([place]) 11/30/2017   Peripheral arterial disease ([place]) 11/28/2017   Chest pain 11/03/2016   Constipation 11/03/2016   CKD (chronic kidney disease), stage II 08/02/2016   Cellulitis of right hand 08/02/2016   Chronic pain 08/02/2016   AKI (acute kidney injury) ([place]) 01/31/2016   Lobar pneumonia ([place]) 01/31/2016   Influenza A 01/31/2016   Diarrhea 01/30/2016   NSVT (nonsustained ventricular tachycardia) ([place]) 09/12/2015   Precordial chest pain 09/10/2015   Cocaine use 09/04/2015   Near syncope 06/29/2015   Essential hypertension 06/29/2015   Status post coronary artery stent placement 06/29/2015   Insulin dependent diabetes mellitus 06/29/2015   History of MI (myocardial infarction) [id number]   Chronic systolic CHF  [id number]   Musculoskeletal chest pain 05/27/2015   OSA on CPAP 01/21/2015   Rectal bleeding    Stage 3 chronic renal impairment associated with type 2 diabetes mellitus ([place]) 09/09/2013   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 09/09/2013   CAD (coronary artery disease) 09/09/2013   Cardiomyopathy, ischemic-EF 40-45% by echo 09/07/13 09/09/2013   S/P LAD DES June 2014 09/06/2013   Tobacco use disorder 09/06/2013   Hyperglycemia 08/22/2012   NSTEMI (non-ST elevated myocardial infarction) ([place]) 07/07/2012   Dyslipidemia 07/07/2012   Hypertensive heart disease     Past Surgical History:  Procedure Laterality Date   APPENDECTOMY     CARDIAC CATHETERIZATION N/A 09/07/2015   Procedure: Left Heart Cath and Coronary Angiography;  Surgeon: Leonie Man, MD;  Location: [place] [facility];  Service: Cardiovascular;  Laterality: N/A;   CARDIAC CATHETERIZATION N/A 09/07/2015   Procedure: Coronary Stent Intervention;  Surgeon: Leonie Man, MD;  Location: [place] [facility];  Service: Cardiovascular;  Laterality: N/A;   CORONARY ANGIOGRAM  09/07/13   residual RCA and OM disease   CORONARY ANGIOPLASTY WITH STENT PLACEMENT     CORONARY STENT INTERVENTION N/A 10/16/2018   Procedure: CORONARY STENT INTERVENTION;  Surgeon: Burnell Blanks, MD;  Location: [place] [facility];  Service: Cardiovascular;  Laterality: N/A;   FRACTURE SURGERY     INSERTION OF ILIAC STENT Left 11/30/2017   Left external illiac stent   INSERTION OF ILIAC STENT  11/30/2017   Procedure: Insertion Of Iliac Stent;  Surgeon: Lorretta Harp, MD;  Location: [place] [facility];  Service: Cardiovascular;;  Left external illiac stent   KNEE ARTHROSCOPY Left    KNEE SURGERY     "ligaments, cartilage; tendon, put a pin in" (11/30/2017)   LEFT HEART CATH Bilateral 07/08/2012   Procedure: LEFT HEART CATH;  Surgeon: [name], MD;  Location: [facility] CATH LAB;  Service: Cardiovascular;   Laterality: Bilateral;   LEFT HEART CATH AND CORONARY ANGIOGRAPHY N/A 10/16/2018   Procedure: LEFT HEART CATH AND CORONARY ANGIOGRAPHY;  Surgeon: Burnell Blanks, MD;  Location: [place] [facility];  Service: Cardiovascular;  Laterality: N/A;   LEFT HEART CATHETERIZATION WITH CORONARY ANGIOGRAM N/A 09/06/2013   STEMI, 2nd ISR LAD. Procedure: LEFT HEART CATHETERIZATION WITH CORONARY ANGIOGRAM;  Surgeon: [name], MD;  Location: [facility] CATH LAB;  Service: Cardiovascular;  Laterality: N/A;   LOWER EXTREMITY ANGIOGRAPHY N/A 11/30/2017   Procedure: LOWER EXTREMITY ANGIOGRAPHY;  Surgeon: Lorretta Harp, MD;  Location: [place] [facility];  Service: Cardiovascular;  Laterality: N/A;   PERCUTANEOUS CORONARY STENT INTERVENTION (PCI-S)  07/08/2012   Procedure: PERCUTANEOUS CORONARY STENT INTERVENTION (PCI-S);  Surgeon: [name], MD;  Location: [facility] CATH LAB;  Service: Cardiovascular;;  DES LAD   PERCUTANEOUS CORONARY STENT INTERVENTION (PCI-S) N/A 09/06/2013   Procedure: PERCUTANEOUS CORONARY STENT INTERVENTION (PCI-S);  Surgeon: [name], MD;  Location: [facility] CATH LAB;  Service: Cardiovascular;  Laterality: N/A;  Mid LAD 3.0/24mm Promus   WRIST FRACTURE SURGERY Bilateral         Home Medications    Prior to Admission medications   Medication Sig Start Date End Date Taking? Authorizing Provider  aspirin EC 81 MG tablet Take 1 tablet (81 mg total) by mouth daily. 10/17/18 10/17/19  Daune Perch, NP  clopidogrel (PLAVIX) 75 MG tablet Take 1 tablet (75 mg total) by mouth daily with breakfast. 10/18/18 10/13/19  Daune Perch, NP  HYDROcodone-acetaminophen (NORCO) 5-325 MG tablet Take 1 tablet by mouth every 6 (six) hours as needed for severe pain. 12/09/17   Julianne Rice, MD  insulin glargine (LANTUS) 100 UNIT/ML injection Inject 30 Units into the skin at bedtime.     [provider]  isosorbide mononitrate (IMDUR) 30 MG 24 hr tablet Take 1 tablet (30 mg  total) by mouth daily. 10/22/18   Rolland Porter, MD  metFORMIN (GLUCOPHAGE) 1000 MG tablet Take 1 tablet (1,000 mg total) by mouth 2 (two) times daily with a meal. Do not restart Metformin until Wednesday am 7/2 Patient taking differently: Take 1,000 mg by mouth 2 (two) times daily with a meal.  07/10/12   Turner, Eber Hong, MD  metoprolol succinate (TOPROL-XL) 25 MG 24 hr tablet Take 1 tablet (25 mg total) by mouth daily. 10/18/18 10/18/19  Daune Perch, NP  nitroGLYCERIN (NITROSTAT) 0.4 MG SL tablet Place 1 tablet (0.4 mg total) under the tongue every 5 (five) minutes x 3 doses as needed for chest pain. 10/05/18   Herminio Commons, MD  pantoprazole (PROTONIX) 40 MG tablet Take 1 tablet (40 mg total) by mouth daily. 10/18/18 10/13/19  Daune Perch, NP  pravastatin (PRAVACHOL) 80 MG tablet Take 1 tablet (80 mg total) by mouth daily at 6 PM. 10/17/18 10/12/19  Daune Perch, NP    Family History Family History  Problem Relation Age of Onset   Hypertension Mother    Diabetes Mother     Social History Social History   Tobacco Use   Smoking status: Former Smoker    Packs/day: 0.50    Years: 48.00    Pack years: 24.00    Types: Cigarettes   Smokeless tobacco: Never Used  Substance Use Topics   Alcohol use: Yes    Comment: 11/30/2017 "might drink a beer q 6 months"   Drug use: No    Types: Cocaine    Comment: 11/30/2017 "last cocaine was ~ 1 wk ago"     Allergies   Patient has no known allergies.   Review of Systems Review of Systems  All other systems reviewed and are negative.    Physical Exam Updated Vital Signs BP (!) 90/56    Pulse 79    Temp 97.9 F (36.6 C) (Oral)    Resp 14    Ht 5\' 9"  (1.753 m)    Wt 106.6 kg    SpO2 98%    BMI 34.70 kg/m   Physical Exam Vitals signs and nursing note reviewed.  Constitutional:      General: He is not in acute distress.    Appearance: Normal appearance. He is well-developed. He is not ill-appearing or toxic-appearing.   HENT:     Head: Normocephalic and atraumatic.     Right Ear: External ear normal.  Left Ear: External ear normal.     Nose: Nose normal. No mucosal edema or rhinorrhea.     Mouth/Throat:     Dentition: No dental abscesses.     Pharynx: No uvula swelling.  Eyes:     Extraocular Movements: Extraocular movements intact.     Conjunctiva/sclera: Conjunctivae normal.     Pupils: Pupils are equal, round, and reactive to light.  Neck:     Musculoskeletal: Full passive range of motion without pain, normal range of motion and neck supple. Muscular tenderness present.     Comments: Patient is tender over the right trapezius.  He also has a large hard mass just to the left of the midline of his lower cervical/upper thoracic spine that is approximately 4 to 5 cm in diameter. Cardiovascular:     Rate and Rhythm: Normal rate and regular rhythm.     Heart sounds: Normal heart sounds. No murmur. No friction rub. No gallop.   Pulmonary:     Effort: Pulmonary effort is normal. No respiratory distress.     Breath sounds: Normal breath sounds. No wheezing, rhonchi or rales.  Chest:     Chest wall: No tenderness or crepitus.  Musculoskeletal: Normal range of motion.        General: No tenderness.     Right lower leg: No edema.     Left lower leg: No edema.     Comments: Moves all extremities well.   Skin:    General: Skin is warm and dry.     Capillary Refill: Capillary refill takes less than 2 seconds.     Coloration: Skin is not pale.     Findings: No erythema or rash.  Neurological:     General: No focal deficit present.     Mental Status: He is alert and oriented to person, place, and time.     Cranial Nerves: No cranial nerve deficit.  Psychiatric:        Mood and Affect: Mood normal. Mood is not anxious.        Speech: Speech normal.        Behavior: Behavior normal.        Thought Content: Thought content normal.      ED Treatments / Results  Labs (all labs ordered are listed,  but only abnormal results are displayed) Results for orders placed or performed during the hospital encounter of 10/21/18  CBC  Result Value Ref Range   WBC 10.0 4.0 - 10.5 K/uL   RBC 5.32 4.22 - 5.81 MIL/uL   Hemoglobin 13.2 13.0 - 17.0 g/dL   HCT 43.8 39.0 - 52.0 %   MCV 82.3 80.0 - 100.0 fL   MCH 24.8 (L) 26.0 - 34.0 pg   MCHC 30.1 30.0 - 36.0 g/dL   RDW 18.5 (H) 11.5 - 15.5 %   Platelets 323 150 - 400 K/uL   nRBC 0.0 0.0 - 0.2 %  Comprehensive metabolic panel  Result Value Ref Range   Sodium 137 135 - 145 mmol/L   Potassium 4.3 3.5 - 5.1 mmol/L   Chloride 103 98 - 111 mmol/L   CO2 26 22 - 32 mmol/L   Glucose, Bld 191 (H) 70 - 99 mg/dL   BUN 17 8 - 23 mg/dL   Creatinine, Ser 1.23 0.61 - 1.24 mg/dL   Calcium 8.7 (L) 8.9 - 10.3 mg/dL   Total Protein 7.2 6.5 - 8.1 g/dL   Albumin 3.4 (L) 3.5 - 5.0 g/dL   AST  13 (L) 15 - 41 U/L   ALT 11 0 - 44 U/L   Alkaline Phosphatase 84 38 - 126 U/L   Total Bilirubin 0.4 0.3 - 1.2 mg/dL   GFR calc non Af Amer >60 >60 mL/min   GFR calc Af Amer >60 >60 mL/min   Anion gap 8 5 - 15  Brain natriuretic peptide  Result Value Ref Range   B Natriuretic Peptide 142.0 (H) 0.0 - 100.0 pg/mL  Troponin I (High Sensitivity)  Result Value Ref Range   Troponin I (High Sensitivity) 11 <18 ng/L  Troponin I (High Sensitivity)  Result Value Ref Range   Troponin I (High Sensitivity) 10 <18 ng/L   Laboratory interpretation all normal except nonsignificantly elevated BNP, hyperglycemia, normal delta troponin    EKG None  ED ECG REPORT   Date: 10/21/2018  Rate: 64  Rhythm: normal sinus rhythm  QRS Axis: left  Intervals: normal  ST/T Wave abnormalities: normal  Conduction Disutrbances:none and Q waves inf and anterolateral leads  Narrative Interpretation:   Old EKG Reviewed: none available  I have personally reviewed the EKG tracing and agree with the computerized printout as noted.      Radiology Dg Chest 2 View  Result Date:  10/22/2018 CLINICAL DATA:  Chest pain EXAM: CHEST - 2 VIEW COMPARISON:  10/15/2018, 01/30/2016, CT 10/15/2018 FINDINGS: No pleural effusion. Stable enlarged cardiomediastinal silhouette with central vascular congestion. No consolidation. No pneumothorax. IMPRESSION: Stable cardiomegaly with central congestion. Electronically Signed   By: Donavan Foil M.D.   On: 10/22/2018 00:19    Procedures Procedures (including critical care time)  Cardiac Cath 10/16/2018  Ost 2nd Diag lesion is 80% stenosed.  1st Mrg-1 lesion is 99% stenosed.  1st Mrg-2 lesion is 90% stenosed.  Dist RCA lesion is 80% stenosed.  Ost RPDA lesion is 70% stenosed.  Balloon angioplasty was performed.  RPDA lesion is 90% stenosed.  Prox RCA lesion is 40% stenosed.  Mid RCA lesion is 10% stenosed.  Prox LAD to Mid LAD lesion is 10% stenosed.  Prox LAD lesion is 40% stenosed.  Dist LAD-1 lesion is 65% stenosed.  Dist LAD-2 lesion is 99% stenosed.  Mid LAD to Dist LAD lesion is 40% stenosed.  Ramus lesion is 90% stenosed.  Mid Cx to Dist Cx lesion is 90% stenosed.  A drug-eluting stent was successfully placed.  Post intervention, there is a 0% residual stenosis.   1. Triple vessel CAD with diffuse, diabetic appearance of the coronary arteries 2. The LAD has a mild to moderate proximal stenosis which is unchanged from last cath. The mid LAD stent is patent with minimal restenosis. The distal LAD has diffuse severe disease, unchanged from last cath. This is not a suitable vessel for PCI distally.  3. The Circumflex has severe diffuse disease in the obtuse marginal branch which is unchanged from the last cath and not suitable for PCI. The distal Circumflex has diffuse disease.  4. The Ramus Intermediate branch is a moderate to large caliber vessel with a severe mid stenosis 5. The RCA is a large, dominant vessel with mild to moderate proximal stenosis. The mid stented segment is patent with minimal restenosis.  The distal vessel has chronic severe disease just before the bifurcation into the relatively small caliber PDA and posterolateral arteries. The PDA has diffuse severe disease but is too small for PCI.  6. Successful PTCA/DES x 1 Ramus intermediate branch  Echocardiogram October 16, 2018 IMPRESSIONS    1. Left ventricular ejection  fraction, by visual estimation, is 40%. The left ventricle has moderately decreased function. Normal left ventricular size. There is mildly increased left ventricular hypertrophy. No obvious apical thrombus. Consider limited  echo with contrast for more optimal apical visualization.  2. Multiple segmental abnormalities exist. See findings.  3. Elevated left ventricular end-diastolic pressure.  4. Left ventricular diastolic Doppler parameters are consistent with pseudonormalization pattern of LV diastolic filling.  5. Global right ventricle has normal systolic function.The right ventricular size is normal. No increase in right ventricular wall thickness.  6. Left atrial size was normal.  7. Right atrial size was normal.  8. Mild to moderate aortic valve annular calcification.  9. The mitral valve is grossly normal. Mild mitral valve regurgitation. 10. The tricuspid valve is grossly normal. Tricuspid valve regurgitation is trivial. 11. The aortic valve is tricuspid Aortic valve regurgitation was not visualized by color flow Doppler. Mild aortic valve sclerosis without stenosis. 12. The pulmonic valve was grossly normal. Pulmonic valve regurgitation is not visualized by color flow Doppler. 13. The inferior vena cava is normal in size with greater than 50% respiratory variability, suggesting right atrial pressure of 3 mmHg.  FINDINGS  Left Ventricle: Left ventricular ejection fraction, by visual estimation, is 40%. The left ventricle has moderately decreased function. There is mildly increased left ventricular hypertrophy. Concentric left ventricular hypertrophy. Normal  left  ventricular size. Spectral Doppler shows Left ventricular diastolic Doppler parameters are consistent with pseudonormalization pattern of LV diastolic filling. Elevated left ventricular end-diastolic pressure.    October 15, 2018 CT CERVICAL SPINE WITHOUT CONTRAST  TECHNIQUE: Multidetector CT imaging of the cervical spine was performed without intravenous contrast. Multiplanar CT image reconstructions were also generated.  COMPARISON:  Plain film cervical spine 11/21/2009.  FINDINGS: Alignment: Maintained mild reversal of lordosis noted.  Skull base and vertebrae: No acute fracture. No primary bone lesion or focal pathologic process.  Soft tissues and spinal canal: No prevertebral fluid or swelling. No visible canal hematoma. There is a fat containing lesion in the subcutaneous soft tissues of the posterior neck on the left measuring 3.5 cm craniocaudal by 4 cm transverse by 2.8 cm AP. The lesion contains soft tissue components and there is some surrounding stranding. It abuts the trapezius.  Disc levels: Mild loss of disc space height and endplate spurring are seen from C3-C7 without obvious central canal stenosis. Scattered mild-to-moderate uncovertebral spurring is most notable on the left at C5-6.  Upper chest: Lung apices clear.  Other: None.  IMPRESSION: No acute finding.  Lipomatous lesion in the subcutaneous soft tissues of the left neck is worrisome for liposarcoma but could be due to fat necrosis. MRI with and without contrast is recommended for further evaluation.  Mild appearing cervical spondylosis. No central canal narrowing by CT scan.   Electronically Signed   By: Inge Rise M.D.   On: 10/15/2018 09:02  Medications Ordered in ED Medications  sodium chloride flush (NS) 0.9 % injection 3 mL (3 mLs Intravenous Not Given 10/21/18 2340)  nitroGLYCERIN (NITROGLYN) 2 % ointment 1 inch (1 inch Topical Given 10/21/18 2345)      Initial Impression / Assessment and Plan / ED Course  I have reviewed the triage vital signs and the nursing notes.  Pertinent labs & imaging results that were available during my care of the patient were reviewed by me and considered in my medical decision making (see chart for details).   Patient had nitroglycerin paste placed.  We discussed doing evaluation of his  heart status and then I will discuss him with cardiology.   Recheck at 1:50 AM patient is resting, he states his chest pain is gone, he still complains of a lot of pain in his left arm.  He has tenderness in the left trapezius but not over the cervical spine.  His second troponin is due to be drawn anytime now.  3:52 AM patient was discussed with Dr. Kalman Shan, cardiologist.  He suggest starting patient on low-dose Imdur.  At this point he states he would not take patient back to Cath Lab and he felt like he needed medical treatment.  He can follow-up with cardiology, he has seen Dr. [name] in the hospital.  At time of discharge I talked to the patient about his test results, I asked him if anybody had discussed the results of his CT of the cervical spine.  We discussed follow-up with his surgeon to have this mass removed and sent off for tissue diagnosis.  He goes to the [facility] and he also sees local physicians.  Patient then asked me if I would give him a prescription for hydrocodone, he states he has run out.  He states he has been on it "4 to 5 years".  I told him he would need to get it from his doctor who normally writes it, he states he gets it from different doctors.  When I look at the [facility] patient has gotten 4 prescriptions for tramadol in the past 2 years, 1 prescription for oxycodone, and 3 prescriptions for hydrocodone.  The majority of the hydrocodone's were for 6 tablets, the oxycodone was for 24 tablets, the tramadol were for 14, 15, and 60 tablets.  These were all prescribed by several of our  emergency department providers.   Final Clinical Impressions(s) / ED Diagnoses   Final diagnoses:  Nonspecific chest pain  Left arm pain  Mass present on one side of neck    ED Discharge Orders         Ordered    isosorbide mononitrate (IMDUR) 30 MG 24 hr tablet  Daily     10/22/18 0424         Plan discharge  Rolland Porter, MD, Barbette Or, MD 10/22/18 [phone]

## 2018-10-21 NOTE — ED Triage Notes (Signed)
Pt C/O chest pain that started at 1600 tonight. Pt has taken 1 nitro and 324 asprin with relief. Pt had stent placed last week. Pt rates CP at a 2.

## 2018-10-22 LAB — COMPREHENSIVE METABOLIC PANEL
ALT: 11 U/L (ref 0–44)
AST: 13 U/L — ABNORMAL LOW (ref 15–41)
Albumin: 3.4 g/dL — ABNORMAL LOW (ref 3.5–5.0)
Alkaline Phosphatase: 84 U/L (ref 38–126)
Anion gap: 8 (ref 5–15)
BUN: 17 mg/dL (ref 8–23)
CO2: 26 mmol/L (ref 22–32)
Calcium: 8.7 mg/dL — ABNORMAL LOW (ref 8.9–10.3)
Chloride: 103 mmol/L (ref 98–111)
Creatinine, Ser: 1.23 mg/dL (ref 0.61–1.24)
GFR calc Af Amer: >60 mL/min (ref >60)
GFR calc non Af Amer: >60 mL/min (ref >60)
Glucose, Bld: 191 mg/dL — ABNORMAL HIGH (ref 70–99)
Potassium: 4.3 mmol/L (ref 3.5–5.1)
Sodium: 137 mmol/L (ref 135–145)
Total Bilirubin: 0.4 mg/dL (ref 0.3–1.2)
Total Protein: 7.2 g/dL (ref 6.5–8.1)

## 2018-10-22 LAB — TROPONIN I (HIGH SENSITIVITY)
Troponin I (High Sensitivity): 11 ng/L (ref <18)
Troponin I (High Sensitivity): 10 ng/L (ref <18)

## 2018-10-22 LAB — BRAIN NATRIURETIC PEPTIDE: B Natriuretic Peptide: 142 pg/mL — ABNORMAL HIGH (ref 0.0–100.0)

## 2018-10-22 MED ORDER — ISOSORBIDE MONONITRATE ER 30 MG PO TB24: 30.0000 mg | ORAL_TABLET | Freq: Every day | ORAL | 0 refills | Status: DC

## 2018-10-22 NOTE — Discharge Instructions (Addendum)
Try ice and heat on that mass on the back of your neck to see if that helps of your pain.  Please call Dr. Arnoldo Morale office to discuss having that mass removed and see what is causing it.  The cardiologist wants you to start taking the Imdur once a day.  Please call Dr. Court Joy office to get a follow-up appointment, tell them you came to the emergency room tonight with chest pain.

## 2018-10-26 ENCOUNTER — Other Ambulatory Visit: Payer: Self-pay

## 2018-10-26 ENCOUNTER — Encounter: Payer: Self-pay | Admitting: Student

## 2018-10-26 ENCOUNTER — Ambulatory Visit (INDEPENDENT_AMBULATORY_CARE_PROVIDER_SITE_OTHER): Payer: No Typology Code available for payment source | Admitting: Student

## 2018-10-26 VITALS — BP 116/70 | HR 82 | Temp 97.3°F | Ht 69.0 in | Wt 238.0 lb

## 2018-10-26 DIAGNOSIS — I255 Ischemic cardiomyopathy: Secondary | ICD-10-CM | POA: Diagnosis not present

## 2018-10-26 DIAGNOSIS — I739 Peripheral vascular disease, unspecified: Secondary | ICD-10-CM | POA: Diagnosis not present

## 2018-10-26 DIAGNOSIS — I10 Essential (primary) hypertension: Secondary | ICD-10-CM

## 2018-10-26 DIAGNOSIS — I2510 Atherosclerotic heart disease of native coronary artery without angina pectoris: Secondary | ICD-10-CM | POA: Diagnosis not present

## 2018-10-26 DIAGNOSIS — E785 Hyperlipidemia, unspecified: Secondary | ICD-10-CM

## 2018-10-26 DIAGNOSIS — R9389 Abnormal findings on diagnostic imaging of other specified body structures: Secondary | ICD-10-CM

## 2018-10-26 MED ORDER — ISOSORBIDE MONONITRATE ER 30 MG PO TB24: 30.0000 mg | ORAL_TABLET | Freq: Every day | ORAL | 3 refills | Status: DC

## 2018-10-26 MED ORDER — LOSARTAN POTASSIUM 25 MG PO TABS: 12.5000 mg | ORAL_TABLET | Freq: Every day | ORAL | 3 refills | Status: DC

## 2018-10-26 NOTE — Progress Notes (Signed)
Cardiology Office Note    Date:  10/27/2018   ID:  [name] October 09, 1955, MRN [id number]  PCP:  [facility], [place] [facility] [place] [place])  Cardiologist: [name], MD    Chief Complaint  Patient presents with   Hospitalization Follow-up    History of Present Illness:    [name] is a 63 y.o. male with past medical history of CAD (s/p prior LAD stenting, DES to RCA in 08/2015, patent stent by repeat cath in 04/2016 with diffuse nonobstructive CAD along distal-LAD, OM2 and PDA, cath in 01/2018 showing 80% mid LCx, 80% mid-LAD, 80% OM1, 50% Prox-RCA and 80% distal RCA and not felt to be candidate for CABG and PCI was not pursued), PAD (s/p PTA and covered stenting of left proximal left external iliac artery in 11/2017), ischemic cardiomyopathy (EF 45-50% by echo in 08/2015), apical thrombus (documented at [place] in early 2020 and started on Xarelto), history of cocaine use, HTN, HLD, and IDDM who presents to the office today for hospital follow-up.  He most recently presented to [facility] on 10/15/2018 for evaluation of worsening chest pain for the past 4 to 6 weeks which could occur at rest or with activity.  He also reported worsening pain down his left arm which resembled when he required stenting in the past. Troponin values were negative but EKG showed T wave inversion along the lateral leads.  He had recurrent pain following admission and given this along with his presenting symptoms, a cardiac catheterization was recommended for definitive evaluation following washout of Xarelto. Echo did show his EF was reduced at 40% but no apical thrombus was noted. Cardiac catheterization on 10/16/2018 showed triple vessel CAD with the LAD having a mild to moderate proximal stenosis which was unchanged from last cath with patent mid LAD stent, severe disease along distal LAD, severe diffuse disease along the Mrg branch and distal LCx, severe stenosis along RI, and patent RCA stent with  chronic severe distal disease. He did undergo successful PTCA/DES x 1 Ramus intermediate branch. He was started on ASA and Plavix with Xarelto being discontinued given completion of treatment for his thrombus and no recurrence by repeat echo. Was continued on Toprol-XL but was not started on ACE-I or ARB due to soft BP.   He did present back to the ED on 10/21/2018 for recurrent chest pain and left arm pain. Troponin values were negative and he was started on Imdur 30mg  daily.   In talking with the patient today, he reports still having episodes of left arm pain but says this chest pain has resolved since recent stent placement. His left arm pain has been occurring for several years and he reports having known arthritis along his rotator cuff. He requests narcotic pain medication today and was informed he will have to follow-up with his PCP at the [place] in regards to this. He denies any recent dyspnea on exertion, orthopnea, PND or lower extremity edema.  He reports good compliance with his current medication regimen. Denies missing any doses of ASA or Plavix since hospital discharge.  Past Medical History:  Diagnosis Date   Bulging lumbar disc    CAD (coronary artery disease) [phone]   a. prior LAD stenting. b. s/p DES to [facility] 08/2015 @ [facility]. c. 04/2016 Cardiac cath at [facility]. # vessel CAD, with patent stent in the PLAD and RCA. Diffuse dLAD, OM2, and  RPDA disease. No aortic stenosis. Elevated LVEDP. No lesion to  suggest ACS. Continue medical managment.   Chronic lower back pain    CKD (chronic kidney disease), stage II    Cocaine abuse ([place])    DM2 (diabetes mellitus, type 2) (HCC)    GERD (gastroesophageal reflux disease)    Headache    "often; not regular" (11/30/2017)   HTN (hypertension)    Hyperlipidemia    Ischemic cardiomyopathy    MI, old    Obesity    Pneumonia    hx   Sleep apnea    "have machine; I don't use it" (11/30/2017)   Tobacco use     Past  Surgical History:  Procedure Laterality Date   APPENDECTOMY     CARDIAC CATHETERIZATION N/A 09/07/2015   Procedure: Left Heart Cath and Coronary Angiography;  Surgeon: Leonie Man, MD;  Location: [place] [facility];  Service: Cardiovascular;  Laterality: N/A;   CARDIAC CATHETERIZATION N/A 09/07/2015   Procedure: Coronary Stent Intervention;  Surgeon: Leonie Man, MD;  Location: [place] [facility];  Service: Cardiovascular;  Laterality: N/A;   CORONARY ANGIOGRAM  09/07/13   residual RCA and OM disease   CORONARY ANGIOPLASTY WITH STENT PLACEMENT     CORONARY STENT INTERVENTION N/A 10/16/2018   Procedure: CORONARY STENT INTERVENTION;  Surgeon: Burnell Blanks, MD;  Location: [place] [facility];  Service: Cardiovascular;  Laterality: N/A;   FRACTURE SURGERY     INSERTION OF ILIAC STENT Left 11/30/2017   Left external illiac stent   INSERTION OF ILIAC STENT  11/30/2017   Procedure: Insertion Of Iliac Stent;  Surgeon: Lorretta Harp, MD;  Location: [place] [facility];  Service: Cardiovascular;;  Left external illiac stent   KNEE ARTHROSCOPY Left    KNEE SURGERY     "ligaments, cartilage; tendon, put a pin in" (11/30/2017)   LEFT HEART CATH Bilateral 07/08/2012   Procedure: LEFT HEART CATH;  Surgeon: [name], MD;  Location: [facility] C[facility] LAB;  Service: Cardiovascular;  Laterality: Bilateral;   LEFT HEART CATH AND CORONARY ANGIOGRAPHY N/A 10/16/2018   Procedure: LEFT HEART CATH AND CORONARY ANGIOGRAPHY;  Surgeon: Burnell Blanks, MD;  Location: [place] [facility];  Service: Cardiovascular;  Laterality: N/A;   LEFT HEART CATHETERIZATION WITH CORONARY ANGIOGRAM N/A 09/06/2013   STEMI, 2nd ISR LAD. Procedure: LEFT HEART CATHETERIZATION WITH CORONARY ANGIOGRAM;  Surgeon: [name], MD;  Location: [facility] CATH LAB;  Service: Cardiovascular;  Laterality: N/A;   LOWER EXTREMITY ANGIOGRAPHY N/A 11/30/2017   Procedure: LOWER EXTREMITY ANGIOGRAPHY;  Surgeon:  Lorretta Harp, MD;  Location: [place] [facility];  Service: Cardiovascular;  Laterality: N/A;   PERCUTANEOUS CORONARY STENT INTERVENTION (PCI-S)  07/08/2012   Procedure: PERCUTANEOUS CORONARY STENT INTERVENTION (PCI-S);  Surgeon: [name], MD;  Location: [facility] CATH LAB;  Service: Cardiovascular;;  DES LAD   PERCUTANEOUS CORONARY STENT INTERVENTION (PCI-S) N/A 09/06/2013   Procedure: PERCUTANEOUS CORONARY STENT INTERVENTION (PCI-S);  Surgeon: [name], MD;  Location: [facility] CATH LAB;  Service: Cardiovascular;  Laterality: N/A;  Mid LAD 3.0/24mm Promus   WRIST FRACTURE SURGERY Bilateral     Current Medications: Outpatient Medications Prior to Visit  Medication Sig Dispense Refill   aspirin EC 81 MG tablet Take 1 tablet (81 mg total) by mouth daily. 90 tablet 3   clopidogrel (PLAVIX) 75 MG tablet Take 1 tablet (75 mg total) by mouth daily with breakfast. 90 tablet 3   insulin glargine (LANTUS) 100 UNIT/ML injection Inject 30 Units into the skin at  bedtime.      metFORMIN (GLUCOPHAGE) 1000 MG tablet Take 1 tablet (1,000 mg total) by mouth 2 (two) times daily with a meal. Do not restart Metformin until Wednesday am 7/2 (Patient taking differently: Take 1,000 mg by mouth 2 (two) times daily with a meal. )     metoprolol succinate (TOPROL-XL) 25 MG 24 hr tablet Take 1 tablet (25 mg total) by mouth daily. 90 tablet 3   nitroGLYCERIN (NITROSTAT) 0.4 MG SL tablet Place 1 tablet (0.4 mg total) under the tongue every 5 (five) minutes x 3 doses as needed for chest pain. 25 tablet 3   pantoprazole (PROTONIX) 40 MG tablet Take 1 tablet (40 mg total) by mouth daily. 90 tablet 3   pravastatin (PRAVACHOL) 80 MG tablet Take 1 tablet (80 mg total) by mouth daily at 6 PM. 90 tablet 3   isosorbide mononitrate (IMDUR) 30 MG 24 hr tablet Take 1 tablet (30 mg total) by mouth daily. 30 tablet 0   HYDROcodone-acetaminophen (NORCO) 5-325 MG tablet Take 1 tablet by mouth every 6 (six) hours as  needed for severe pain. 6 tablet 0   No facility-administered medications prior to visit.      Allergies:   Patient has no known allergies.   Social History   Socioeconomic History   Marital status: Divorced    Spouse name: Not on file   Number of children: Not on file   Years of education: Not on file   Highest education level: Not on file  Occupational History   Not on file  Social Needs   Financial resource strain: Not on file   Food insecurity    Worry: Not on file    Inability: Not on file   Transportation needs    Medical: Not on file    Non-medical: Not on file  Tobacco Use   Smoking status: Former Smoker    Packs/day: 0.50    Years: 48.00    Pack years: 24.00    Types: Cigarettes   Smokeless tobacco: Never Used  Substance and Sexual Activity   Alcohol use: Yes    Comment: 11/30/2017 "might drink a beer q 6 months"   Drug use: No    Types: Cocaine    Comment: 11/30/2017 "last cocaine was ~ 1 wk ago"   Sexual activity: Yes  Lifestyle   Physical activity    Days per week: Not on file    Minutes per session: Not on file   Stress: Not on file  Relationships   Social connections    Talks on phone: Not on file    Gets together: Not on file    Attends religious service: Not on file    Active member of club or organization: Not on file    Attends meetings of clubs or organizations: Not on file    Relationship status: Not on file  Other Topics Concern   Not on file  Social History Narrative   ** Merged History Encounter **         Family History:  The patient's family history includes Diabetes in his mother; Hypertension in his mother.   Review of Systems:   Please see the history of present illness.     General:  No chills, fever, night sweats or weight changes. Positive for left arm pain.  Cardiovascular:  No chest pain, dyspnea on exertion, edema, orthopnea, palpitations, paroxysmal nocturnal dyspnea. Dermatological: No rash,  lesions/masses Respiratory: No cough, dyspnea Urologic: No hematuria, dysuria Abdominal:  No nausea, vomiting, diarrhea, bright red blood per rectum, melena, or hematemesis Neurologic:  No visual changes, wkns, changes in mental status. All other systems reviewed and are otherwise negative except as noted above.   Physical Exam:    VS:  BP 116/70    Pulse 82    Temp (!) 97.3 F (36.3 C) (Temporal)    Ht 5\' 9"  (1.753 m)    Wt 238 lb (108 kg)    SpO2 95%    BMI 35.15 kg/m    General: Well developed, well nourished,male appearing in no acute distress. Head: Normocephalic, atraumatic, sclera non-icteric, no xanthomas, nares are without discharge.  Neck: No carotid bruits. JVD not elevated.  Lungs: Respirations regular and unlabored, without wheezes or rales.  Heart: Regular rate and rhythm. No S3 or S4.  No murmur, no rubs, or gallops appreciated. Abdomen: Soft, non-tender, non-distended with normoactive bowel sounds. No hepatomegaly. No rebound/guarding. No obvious abdominal masses. Msk:  Strength and tone appear normal for age. No joint deformities or effusions. Extremities: No clubbing or cyanosis. No lower extremity edema.  Distal pedal pulses are 2+ bilaterally. Neuro: Alert and oriented X 3. Moves all extremities spontaneously. No focal deficits noted. Psych:  Responds to questions appropriately with a normal affect. Skin: No rashes or lesions noted  Wt Readings from Last 3 Encounters:  10/26/18 238 lb (108 kg)  10/21/18 235 lb (106.6 kg)  10/17/18 239 lb (108.4 kg)        Studies/Labs Reviewed:   EKG:  EKG is not ordered today. EKG from 10/21/2018 is reviewed which shows NSR, HR 64 with inferior infarct pattern but no acute ST changes when compared to prior tracings.   Recent Labs: 10/15/2018: TSH 2.759 10/21/2018: ALT 11; B Natriuretic Peptide 142.0; BUN 17; Creatinine, Ser 1.23; Hemoglobin 13.2; Platelets 323; Potassium 4.3; Sodium 137   Lipid Panel    Component Value  Date/Time   CHOL 161 10/16/2018 0941   TRIG 117 10/16/2018 0941   HDL 24 (L) 10/16/2018 0941   CHOLHDL 6.7 10/16/2018 0941   VLDL 23 10/16/2018 0941   LDLCALC 114 (H) 10/16/2018 0941    Additional studies/ records that were reviewed today include:   Echocardiogram: 10/16/2018 IMPRESSIONS    1. Left ventricular ejection fraction, by visual estimation, is 40%. The left ventricle has moderately decreased function. Normal left ventricular size. There is mildly increased left ventricular hypertrophy. No obvious apical thrombus. Consider limited  echo with contrast for more optimal apical visualization.  2. Multiple segmental abnormalities exist. See findings.  3. Elevated left ventricular end-diastolic pressure.  4. Left ventricular diastolic Doppler parameters are consistent with pseudonormalization pattern of LV diastolic filling.  5. Global right ventricle has normal systolic function.The right ventricular size is normal. No increase in right ventricular wall thickness.  6. Left atrial size was normal.  7. Right atrial size was normal.  8. Mild to moderate aortic valve annular calcification.  9. The mitral valve is grossly normal. Mild mitral valve regurgitation. 10. The tricuspid valve is grossly normal. Tricuspid valve regurgitation is trivial. 11. The aortic valve is tricuspid Aortic valve regurgitation was not visualized by color flow Doppler. Mild aortic valve sclerosis without stenosis. 12. The pulmonic valve was grossly normal. Pulmonic valve regurgitation is not visualized by color flow Doppler. 13. The inferior vena cava is normal in size with greater than 50% respiratory variability, suggesting right atrial pressure of 3 mmHg.   Cardiac Catheterization: 10/16/2018  Ost 2nd Diag lesion is 80%  stenosed.  1st Mrg-1 lesion is 99% stenosed.  1st Mrg-2 lesion is 90% stenosed.  Dist RCA lesion is 80% stenosed.  Ost RPDA lesion is 70% stenosed.  Balloon angioplasty was  performed.  RPDA lesion is 90% stenosed.  Prox RCA lesion is 40% stenosed.  Mid RCA lesion is 10% stenosed.  Prox LAD to Mid LAD lesion is 10% stenosed.  Prox LAD lesion is 40% stenosed.  Dist LAD-1 lesion is 65% stenosed.  Dist LAD-2 lesion is 99% stenosed.  Mid LAD to Dist LAD lesion is 40% stenosed.  Ramus lesion is 90% stenosed.  Mid Cx to Dist Cx lesion is 90% stenosed.  A drug-eluting stent was successfully placed.  Post intervention, there is a 0% residual stenosis.   1. Triple vessel CAD with diffuse, diabetic appearance of the coronary arteries 2. The LAD has a mild to moderate proximal stenosis which is unchanged from last cath. The mid LAD stent is patent with minimal restenosis. The distal LAD has diffuse severe disease, unchanged from last cath. This is not a suitable vessel for PCI distally.  3. The Circumflex has severe diffuse disease in the obtuse marginal branch which is unchanged from the last cath and not suitable for PCI. The distal Circumflex has diffuse disease.  4. The Ramus Intermediate branch is a moderate to large caliber vessel with a severe mid stenosis 5. The RCA is a large, dominant vessel with mild to moderate proximal stenosis. The mid stented segment is patent with minimal restenosis. The distal vessel has chronic severe disease just before the bifurcation into the relatively small caliber PDA and posterolateral arteries. The PDA has diffuse severe disease but is too small for PCI.  6. Successful PTCA/DES x 1 Ramus intermediate branch  Recommendations: ASA and Plavix for one month along with Xarelto. I would stop ASA at one month and continue Plavix along with Xarelto at that time.   Assessment:    1. Coronary artery disease involving native coronary artery of native heart without angina pectoris   2. Cardiomyopathy, ischemic   3. PVD (peripheral vascular disease) ([place])   4. Essential hypertension   5. Hyperlipidemia LDL goal <70   6.  Abnormal CT scan      Plan:   In order of problems listed above:  1. CAD - he is s/p prior LAD stenting, DES to RCA in 08/2015, patent stent by repeat cath in 04/2016 with diffuse nonobstructive CAD along distal-LAD, OM2 and PDA, cath in 01/2018 showing 80% mid LCx, 80% mid-LAD, 80% OM1, 50% Prox-RCA and 80% distal RCA and not felt to be candidate for CABG and PCI was not pursued.  - repeat cath performed last admission and he underwent successful PTCA/DES x 1 Ramus intermediate branch.  - he denies any recurrent chest pain. Does have left arm pain which seems most consistent with known arthritis and possible neuropathy and did not change after recent stent placement.  - continued compliance with ASA and Plavix encouraged. Remains on BB, statin and Imdur as well.   2. Ischemic Cardiomyopathy - EF was reduced at 40% by most recent echocardiogram. He denies any recent dyspnea on exertion, orthopnea, PND or edema. - continue Toprol-XL 25mg  daily. Will add low-dose Losartan 12.5mg  daily. Repeat BMET in 2 weeks.    3. PVD - s/p PTA and covered stenting of left proximal left external iliac artery in 11/2017. He denies any recent claudication symptoms. - continue ASA and Plavix along with statin therapy.   4. HTN -  BP is well-controlled at 116/70 during today's visit. - continue current medication regimen with initiation of Losartan 12.5mg  daily as outlined above.   5. HLD - LDL elevated to 114 during recent admission. Previously self-discontinued prior to recent admission but he has been restarted on Pravastatin 80mg  daily. Will need repeat FLP and LFT's in 8 weeks. Intolerant to Atorvastatin and Crestor in the past.   6. Abnormal CT Scan - recent CT of the Cervical Spine showed a lipomatous lesion in the subcutaneous soft tissues of the left neck worrisome for liposarcoma but could be due to fat necrosis.  - reviewed results with the patient today and recommended he follow-up with his PCP  at the [facility] for further evaluation. A printed copy of the report was provided.   Medication Adjustments/Labs and Tests Ordered: Current medicines are reviewed at length with the patient today.  Concerns regarding medicines are outlined above.  Medication changes, Labs and Tests ordered today are listed in the Patient Instructions below. Patient Instructions  Medication Instructions:  Start losartan 12.5 mg daily   Labwork: 2 weeks  bmet  Testing/Procedures: none  Follow-Up: Your physician recommends that you schedule a follow-up appointment in: December 2020    Any Other Special Instructions Will Be Listed Below (If Applicable).     If you need a refill on your cardiac medications before your next appointment, please call your pharmacy.      Signed, Erma Heritage, PA-C  10/27/2018 8:31 AM    [facility] [place], [place] [address] Phone: [phone] Fax: [phone]

## 2018-10-26 NOTE — Patient Instructions (Signed)
Medication Instructions:  Start losartan 12.5 mg daily   Labwork: 2 weeks  bmet  Testing/Procedures: none  Follow-Up: Your physician recommends that you schedule a follow-up appointment in: December 2020    Any Other Special Instructions Will Be Listed Below (If Applicable).     If you need a refill on your cardiac medications before your next appointment, please call your pharmacy.

## 2018-10-27 ENCOUNTER — Encounter: Payer: Self-pay | Admitting: Student

## 2019-01-10 ENCOUNTER — Other Ambulatory Visit: Payer: Self-pay

## 2019-01-10 ENCOUNTER — Encounter: Payer: Self-pay | Admitting: Student

## 2019-01-10 ENCOUNTER — Ambulatory Visit (INDEPENDENT_AMBULATORY_CARE_PROVIDER_SITE_OTHER): Payer: Medicare Other | Admitting: Student

## 2019-01-10 ENCOUNTER — Telehealth: Payer: Self-pay

## 2019-01-10 VITALS — BP 141/80 | HR 87 | Temp 97.0°F | Ht 70.0 in | Wt 235.0 lb

## 2019-01-10 DIAGNOSIS — I2510 Atherosclerotic heart disease of native coronary artery without angina pectoris: Secondary | ICD-10-CM | POA: Diagnosis not present

## 2019-01-10 DIAGNOSIS — I739 Peripheral vascular disease, unspecified: Secondary | ICD-10-CM

## 2019-01-10 DIAGNOSIS — I10 Essential (primary) hypertension: Secondary | ICD-10-CM

## 2019-01-10 DIAGNOSIS — I255 Ischemic cardiomyopathy: Secondary | ICD-10-CM | POA: Diagnosis not present

## 2019-01-10 DIAGNOSIS — N529 Male erectile dysfunction, unspecified: Secondary | ICD-10-CM

## 2019-01-10 DIAGNOSIS — Z720 Tobacco use: Secondary | ICD-10-CM

## 2019-01-10 MED ORDER — SILDENAFIL CITRATE 50 MG PO TABS: 50.0000 mg | ORAL_TABLET | Freq: Every day | ORAL | 0 refills | Status: DC | PRN

## 2019-01-10 NOTE — Telephone Encounter (Signed)
Refill for Sildenafil did not go through to the pharmacy, please fill again.

## 2019-01-10 NOTE — Progress Notes (Signed)
Cardiology Office Note    Date:  01/10/2019   ID:  [name] 10-Aug-1955, MRN [id number]  PCP:  System, Pcp Not In  Cardiologist: [name], MD    Chief Complaint  Patient presents with  . Follow-up    3 month visit    History of Present Illness:    [name] is a 63 y.o. male with past medical history of CAD (s/p prior LAD stenting, DES to RCA in 08/2015, patent stent by repeat cath in 04/2016 with diffuse nonobstructive CAD along distal-LAD, OM2 and PDA, cath in 01/2018 showing 80% mid LCx, 80% mid-LAD, 80% OM1, 50% Prox-RCA and 80% distal RCA and not felt to be candidate for CABG and PCI was not pursued, s/p DESx1 to RI in 10/2018), PAD (s/p PTA and covered stenting of left proximal left external iliac artery in 11/2017), ischemic cardiomyopathy (EF 45-50% by echo in 08/2015, at 40% by echo in 10/2018), apical thrombus (documented at [place] in early 2020 and started on Xarelto), history of cocaine use, HTN, HLD, and IDDM who presents to the office today for 101-month follow-up.  He was last examined by myself in 10/2018 following his recent admission and undergoing stent placement as outlined below. He denied any exertional chest pain or dyspnea on exertion. He was still having episodes of left arm pain which was worse with positional changes and he reported having known arthritis along his rotator cuff. He was continued on ASA and Plavix along with beta-blocker, Imdur and statin therapy. In regards to his cardiomyopathy, Losartan 12.5 mg daily was added to his medication regimen. A follow-up BMET was ordered but never obtained. CT imaging during his admission had shown a lipomatous lesion along the left neck worrisome for liposarcoma but could be fat necrosis and follow-up with his PCP was recommended.  In talking with the patient today, he reports actually having self discontinued all of his medications intermittently for weeks at a time since his last visit. He  reports forgetting to take them but also mentioned he has been having issues with erectile dysfunction and felt that his medications are contributing to this. He has not yet taken any of his medications today.  He denies any recurrent episodes of chest pai.  No recent palpitations, dyspnea on exertion, orthopnea, PND or lower extremity edema.   Past Medical History:  Diagnosis Date  . Bulging lumbar disc   . CAD (coronary artery disease) [phone]   a. prior LAD stenting. b. s/p DES to [facility] 08/2015 @ [facility]. c. 04/2016 Cardiac cath at [facility]. # vessel CAD, with patent stent in the PLAD and RCA. Diffuse dLAD, OM2, and  RPDA disease. No aortic stenosis. Elevated LVEDP. No lesion to suggest ACS. Continue medical managment.  . Chronic lower back pain   . CKD (chronic kidney disease), stage II   . Cocaine abuse ([place])   . DM2 (diabetes mellitus, type 2) ([place])   . GERD (gastroesophageal reflux disease)   . Headache    "often; not regular" (11/30/2017)  . HTN (hypertension)   . Hyperlipidemia   . Ischemic cardiomyopathy   . MI, old   . Obesity   . Pneumonia    hx  . Sleep apnea    "have machine; I don't use it" (11/30/2017)  . Tobacco use     Past Surgical History:  Procedure Laterality Date  . APPENDECTOMY    . CARDIAC CATHETERIZATION N/A 09/07/2015   Procedure: Left Heart Cath and  Coronary Angiography;  Surgeon: Leonie Man, MD;  Location: [place] [facility];  Service: Cardiovascular;  Laterality: N/A;  . CARDIAC CATHETERIZATION N/A 09/07/2015   Procedure: Coronary Stent Intervention;  Surgeon: Leonie Man, MD;  Location: [place] [facility];  Service: Cardiovascular;  Laterality: N/A;  . CORONARY ANGIOGRAM  09/07/13   residual RCA and OM disease  . CORONARY ANGIOPLASTY WITH STENT PLACEMENT    . CORONARY STENT INTERVENTION N/A 10/16/2018   Procedure: CORONARY STENT INTERVENTION;  Surgeon: Burnell Blanks, MD;  Location: [place] [facility];  Service: Cardiovascular;   Laterality: N/A;  . FRACTURE SURGERY    . INSERTION OF ILIAC STENT Left 11/30/2017   Left external illiac stent  . INSERTION OF ILIAC STENT  11/30/2017   Procedure: Insertion Of Iliac Stent;  Surgeon: Lorretta Harp, MD;  Location: [place] [facility];  Service: Cardiovascular;;  Left external illiac stent  . KNEE ARTHROSCOPY Left   . KNEE SURGERY     "ligaments, cartilage; tendon, put a pin in" (11/30/2017)  . LEFT HEART CATH Bilateral 07/08/2012   Procedure: LEFT HEART CATH;  Surgeon: [name], MD;  Location: [facility] CATH LAB;  Service: Cardiovascular;  Laterality: Bilateral;  . LEFT HEART CATH AND CORONARY ANGIOGRAPHY N/A 10/16/2018   Procedure: LEFT HEART CATH AND CORONARY ANGIOGRAPHY;  Surgeon: Burnell Blanks, MD;  Location: [place] [facility];  Service: Cardiovascular;  Laterality: N/A;  . LEFT HEART CATHETERIZATION WITH CORONARY ANGIOGRAM N/A 09/06/2013   STEMI, 2nd ISR LAD. Procedure: LEFT HEART CATHETERIZATION WITH CORONARY ANGIOGRAM;  Surgeon: [name], MD;  Location: [facility] CATH LAB;  Service: Cardiovascular;  Laterality: N/A;  . LOWER EXTREMITY ANGIOGRAPHY N/A 11/30/2017   Procedure: LOWER EXTREMITY ANGIOGRAPHY;  Surgeon: Lorretta Harp, MD;  Location: [place] [facility];  Service: Cardiovascular;  Laterality: N/A;  . PERCUTANEOUS CORONARY STENT INTERVENTION (PCI-S)  07/08/2012   Procedure: PERCUTANEOUS CORONARY STENT INTERVENTION (PCI-S);  Surgeon: [name], MD;  Location: [facility] CATH LAB;  Service: Cardiovascular;;  DES LAD  . PERCUTANEOUS CORONARY STENT INTERVENTION (PCI-S) N/A 09/06/2013   Procedure: PERCUTANEOUS CORONARY STENT INTERVENTION (PCI-S);  Surgeon: [name], MD;  Location: [facility] CATH LAB;  Service: Cardiovascular;  Laterality: N/A;  Mid LAD 3.0/24mm Promus  . WRIST FRACTURE SURGERY Bilateral     Current Medications: Outpatient Medications Prior to Visit  Medication Sig Dispense Refill  . aspirin EC 81 MG tablet Take 1 tablet  (81 mg total) by mouth daily. 90 tablet 3  . clopidogrel (PLAVIX) 75 MG tablet Take 1 tablet (75 mg total) by mouth daily with breakfast. 90 tablet 3  . insulin glargine (LANTUS) 100 UNIT/ML injection Inject 30 Units into the skin at bedtime.     Marland Kitchen losartan (COZAAR) 25 MG tablet Take 0.5 tablets (12.5 mg total) by mouth daily. 90 tablet 3  . metFORMIN (GLUCOPHAGE) 1000 MG tablet Take 1 tablet (1,000 mg total) by mouth 2 (two) times daily with a meal. Do not restart Metformin until Wednesday am 7/2 (Patient taking differently: Take 1,000 mg by mouth 2 (two) times daily with a meal. )    . metoprolol succinate (TOPROL-XL) 25 MG 24 hr tablet Take 1 tablet (25 mg total) by mouth daily. 90 tablet 3  . nitroGLYCERIN (NITROSTAT) 0.4 MG SL tablet Place 1 tablet (0.4 mg total) under the tongue every 5 (five) minutes x 3 doses as needed for chest pain. 25 tablet 3  . pantoprazole (PROTONIX) 40  MG tablet Take 1 tablet (40 mg total) by mouth daily. 90 tablet 3  . pravastatin (PRAVACHOL) 80 MG tablet Take 1 tablet (80 mg total) by mouth daily at 6 PM. 90 tablet 3  . isosorbide mononitrate (IMDUR) 30 MG 24 hr tablet Take 1 tablet (30 mg total) by mouth daily. 90 tablet 3   No facility-administered medications prior to visit.     Allergies:   Patient has no known allergies.   Social History   Socioeconomic History  . Marital status: Divorced    Spouse name: Not on file  . Number of children: Not on file  . Years of education: Not on file  . Highest education level: Not on file  Occupational History  . Not on file  Tobacco Use  . Smoking status: Former Smoker    Packs/day: 0.50    Years: 48.00    Pack years: 24.00    Types: Cigarettes  . Smokeless tobacco: Never Used  Substance and Sexual Activity  . Alcohol use: Yes    Comment: 11/30/2017 "might drink a beer q 6 months"  . Drug use: No    Types: Cocaine    Comment: 11/30/2017 "last cocaine was ~ 1 wk ago"  . Sexual activity: Yes  Other  Topics Concern  . Not on file  Social History Narrative   ** Merged History Encounter **       Social Determinants of Health   Financial Resource Strain:   . Difficulty of Paying Living Expenses: Not on file  Food Insecurity:   . Worried About Charity fundraiser in the Last Year: Not on file  . Ran Out of Food in the Last Year: Not on file  Transportation Needs:   . Lack of Transportation (Medical): Not on file  . Lack of Transportation (Non-Medical): Not on file  Physical Activity:   . Days of Exercise per Week: Not on file  . Minutes of Exercise per Session: Not on file  Stress:   . Feeling of Stress : Not on file  Social Connections:   . Frequency of Communication with Friends and Family: Not on file  . Frequency of Social Gatherings with Friends and Family: Not on file  . Attends Religious Services: Not on file  . Active Member of Clubs or Organizations: Not on file  . Attends Archivist Meetings: Not on file  . Marital Status: Not on file     Family History:  The patient's family history includes Diabetes in his mother; Hypertension in his mother.   Review of Systems:   Please see the history of present illness.     General:  No chills, fever, night sweats or weight changes.  Cardiovascular:  No chest pain, dyspnea on exertion, edema, orthopnea, palpitations, paroxysmal nocturnal dyspnea. Positive for erectile dysfunction.  Dermatological: No rash, lesions/masses Respiratory: No cough, dyspnea Urologic: No hematuria, dysuria Abdominal:   No nausea, vomiting, diarrhea, bright red blood per rectum, melena, or hematemesis Neurologic:  No visual changes, wkns, changes in mental status.  All other systems reviewed and are otherwise negative except as noted above.   Physical Exam:    VS:  BP (!) 141/80   Pulse 87   Temp (!) 97 F (36.1 C)   Ht 5\' 10"  (1.778 m)   Wt 235 lb (106.6 kg)   SpO2 96%   BMI 33.72 kg/m    General: Well developed, well  nourished,male appearing in no acute distress. Head: Normocephalic, atraumatic,  sclera non-icteric, no xanthomas, nares are without discharge.  Neck: No carotid bruits. JVD not elevated.  Lungs: Respirations regular and unlabored, without wheezes or rales.  Heart: Regular rate and rhythm. No S3 or S4.  No murmur, no rubs, or gallops appreciated. Abdomen: Soft, non-tender, non-distended with normoactive bowel sounds. No hepatomegaly. No rebound/guarding. No obvious abdominal masses. Msk:  Strength and tone appear normal for age. No joint deformities or effusions. Extremities: No clubbing or cyanosis. No lower extremity edema.  Distal pedal pulses are 2+ bilaterally. Neuro: Alert and oriented X 3. Moves all extremities spontaneously. No focal deficits noted. Psych:  Responds to questions appropriately with a normal affect. Skin: No rashes or lesions noted  Wt Readings from Last 3 Encounters:  01/10/19 235 lb (106.6 kg)  10/26/18 238 lb (108 kg)  10/21/18 235 lb (106.6 kg)     Studies/Labs Reviewed:   EKG:  EKG is not ordered today.   Recent Labs: 10/15/2018: TSH 2.759 10/21/2018: ALT 11; B Natriuretic Peptide 142.0; BUN 17; Creatinine, Ser 1.23; Hemoglobin 13.2; Platelets 323; Potassium 4.3; Sodium 137   Lipid Panel    Component Value Date/Time   CHOL 161 10/16/2018 0941   TRIG 117 10/16/2018 0941   HDL 24 (L) 10/16/2018 0941   CHOLHDL 6.7 10/16/2018 0941   VLDL 23 10/16/2018 0941   LDLCALC 114 (H) 10/16/2018 0941    Additional studies/ records that were reviewed today include:   Echocardiogram: 10/2018 IMPRESSIONS    1. Left ventricular ejection fraction, by visual estimation, is 40%. The left ventricle has moderately decreased function. Normal left ventricular size. There is mildly increased left ventricular hypertrophy. No obvious apical thrombus. Consider limited  echo with contrast for more optimal apical visualization.  2. Multiple segmental abnormalities exist. See  findings.  3. Elevated left ventricular end-diastolic pressure.  4. Left ventricular diastolic Doppler parameters are consistent with pseudonormalization pattern of LV diastolic filling.  5. Global right ventricle has normal systolic function.The right ventricular size is normal. No increase in right ventricular wall thickness.  6. Left atrial size was normal.  7. Right atrial size was normal.  8. Mild to moderate aortic valve annular calcification.  9. The mitral valve is grossly normal. Mild mitral valve regurgitation. 10. The tricuspid valve is grossly normal. Tricuspid valve regurgitation is trivial. 11. The aortic valve is tricuspid Aortic valve regurgitation was not visualized by color flow Doppler. Mild aortic valve sclerosis without stenosis. 12. The pulmonic valve was grossly normal. Pulmonic valve regurgitation is not visualized by color flow Doppler. 13. The inferior vena cava is normal in size with greater than 50% respiratory variability, suggesting right atrial pressure of 3 mmHg.  Cardiac Catheterization: 10/16/2018  Ost 2nd Diag lesion is 80% stenosed.  1st Mrg-1 lesion is 99% stenosed.  1st Mrg-2 lesion is 90% stenosed.  Dist RCA lesion is 80% stenosed.  Ost RPDA lesion is 70% stenosed.  Balloon angioplasty was performed.  RPDA lesion is 90% stenosed.  Prox RCA lesion is 40% stenosed.  Mid RCA lesion is 10% stenosed.  Prox LAD to Mid LAD lesion is 10% stenosed.  Prox LAD lesion is 40% stenosed.  Dist LAD-1 lesion is 65% stenosed.  Dist LAD-2 lesion is 99% stenosed.  Mid LAD to Dist LAD lesion is 40% stenosed.  Ramus lesion is 90% stenosed.  Mid Cx to Dist Cx lesion is 90% stenosed.  A drug-eluting stent was successfully placed.  Post intervention, there is a 0% residual stenosis.   1. Triple  vessel CAD with diffuse, diabetic appearance of the coronary arteries 2. The LAD has a mild to moderate proximal stenosis which is unchanged from last cath. The  mid LAD stent is patent with minimal restenosis. The distal LAD has diffuse severe disease, unchanged from last cath. This is not a suitable vessel for PCI distally.  3. The Circumflex has severe diffuse disease in the obtuse marginal branch which is unchanged from the last cath and not suitable for PCI. The distal Circumflex has diffuse disease.  4. The Ramus Intermediate branch is a moderate to large caliber vessel with a severe mid stenosis 5. The RCA is a large, dominant vessel with mild to moderate proximal stenosis. The mid stented segment is patent with minimal restenosis. The distal vessel has chronic severe disease just before the bifurcation into the relatively small caliber PDA and posterolateral arteries. The PDA has diffuse severe disease but is too small for PCI.  6. Successful PTCA/DES x 1 Ramus intermediate branch  Recommendations: ASA and Plavix for one month along with Xarelto. I would stop ASA at one month and continue Plavix along with Xarelto at that time.    Assessment:    1. Coronary artery disease involving native coronary artery of native heart without angina pectoris   2. Cardiomyopathy, ischemic   3. PVD (peripheral vascular disease) ([place])   4. Erectile dysfunction, unspecified erectile dysfunction type   5. Essential hypertension   6. Tobacco use      Plan:   In order of problems listed above:  1. CAD - s/p prior LAD stenting, DES to RCA in 08/2015, patent stent by repeat cath in 04/2016 with diffuse nonobstructive CAD along distal-LAD, OM2 and PDA with catheterization in 01/2018 showing 80% mid LCx, 80% mid-LAD, 80% OM1, 50% Prox-RCA and 80% distal RCA and not felt to be candidate for CABG and PCI was not pursued. Ultimately underwent DESx1 to RI in 10/2018. - As mentioned above, he has been going weeks at a time without his medications. Compliance with DAPT STRONGLY ENCOURAGED. Continue BB and statin therapy. Stopping Imdur given no recent chest pain and the  use of Sildenafil.  2. Ischemic Cardiomyopathy - He has a known reduced EF of 40% by echocardiogram in 10/2018. He had previously been on Xarelto for documented apical thrombus in early 2020 but this has since been discontinued given no evidence of thrombus by repeat ultrasound. - He denies any recent dyspnea on exertion, orthopnea, PND or edema. - Continue Losartan 12.5 mg daily and Toprol-XL 25 mg daily. The importance of follow-up lab work was reviewed.  3. PAD  - s/p PTA and covered stenting of left proximal left external iliac artery in 11/2017. Followed by Vascular Surgery. Continue ASA, statin and Plavix.   4. Erectile Dysfunction - Suspect this is a contributing factor to him stopping his medications as outlined above. To improve compliance with his medication regimen, will plan to stop Imdur given no recent chest pain and provide Rx for Sildenafil. Risks and benefits of the medication were reviewed. He has used this in the past.  5. HTN - BP is elevated at 141/80 during today's visit but he has not taken his medications. Recommended compliance with his regimen as outlined above. Continue current regimen for now with Losartan 12.5 mg daily and Toprol-XL 25 mg daily.  6. Tobacco Use - he continues to smoke 1 ppd. Cessation advised.    Medication Adjustments/Labs and Tests Ordered: Current medicines are reviewed at length with the patient  today.  Concerns regarding medicines are outlined above.  Medication changes, Labs and Tests ordered today are listed in the Patient Instructions below. Patient Instructions  Medication InstructionS: STOP ISOSORBIDE  MUST TAKE ASPIRIN & PLAVIX   Sildenafil  50 mg - take 25 mg (1/2) tablet 30 minutes prior to sexual activity   Labwork NONE  Testing/Procedures: NONE  Follow-Up: Your physician recommends that you schedule a follow-up appointment in: 3 MONTHS   Any Other Special Instructions Will Be Listed Below (If Applicable).  PLEASE  TAKE ASPIRIN & PLAVIX DAILY   If you need a refill on your cardiac medications before your next appointment, please call your pharmacy.    Signed, Erma Heritage, PA-C  01/10/2019 5:09 PM    [facility] [address] [place], [place] [address] Phone: [phone] Fax: [phone]

## 2019-01-10 NOTE — Patient Instructions (Addendum)
Medication InstructionS: STOP ISOSORBIDE  MUST TAKE ASPIRIN & PLAVIX   Sildenafil  50 mg - take 25 mg (1/2) tablet 30 minutes prior to sexual activity   Labwork NONE  Testing/Procedures: NONE  Follow-Up: Your physician recommends that you schedule a follow-up appointment in: 3 MONTHS    Any Other Special Instructions Will Be Listed Below (If Applicable).  PLEASE TAKE ASPIRIN & PLAVIX DAILY    If you need a refill on your cardiac medications before your next appointment, please call your pharmacy.

## 2019-01-14 ENCOUNTER — Other Ambulatory Visit: Payer: Self-pay

## 2019-01-14 MED ORDER — SILDENAFIL CITRATE 50 MG PO TABS: 50.0000 mg | ORAL_TABLET | Freq: Every day | ORAL | 0 refills | Status: DC | PRN

## 2019-02-06 ENCOUNTER — Telehealth: Payer: Self-pay

## 2019-02-06 MED ORDER — CLOPIDOGREL BISULFATE 75 MG PO TABS: 75.0000 mg | ORAL_TABLET | Freq: Every day | ORAL | 3 refills | Status: AC

## 2019-02-06 MED ORDER — PANTOPRAZOLE SODIUM 40 MG PO TBEC: 40.0000 mg | DELAYED_RELEASE_TABLET | Freq: Every day | ORAL | 3 refills | Status: DC

## 2019-02-06 MED ORDER — SILDENAFIL CITRATE 50 MG PO TABS: 50.0000 mg | ORAL_TABLET | Freq: Every day | ORAL | 0 refills | Status: DC | PRN

## 2019-02-06 MED ORDER — LOSARTAN POTASSIUM 25 MG PO TABS: 12.5000 mg | ORAL_TABLET | Freq: Every day | ORAL | 3 refills | Status: DC

## 2019-02-06 NOTE — Telephone Encounter (Signed)
PT came in office asking for refills of protonix, plavix, losartan, & sildenafil be faxed over to [facility]. [phone]

## 2019-03-10 ENCOUNTER — Other Ambulatory Visit: Payer: Self-pay

## 2019-03-10 ENCOUNTER — Emergency Department (HOSPITAL_COMMUNITY): Payer: No Typology Code available for payment source

## 2019-03-10 ENCOUNTER — Inpatient Hospital Stay (HOSPITAL_COMMUNITY)
Admission: EM | Admit: 2019-03-10 | Discharge: 2019-03-12 | DRG: 247 | Disposition: A | Discharge status: Home / Self Care | Payer: No Typology Code available for payment source | Attending: Cardiology | Admitting: Cardiology

## 2019-03-10 ENCOUNTER — Encounter (HOSPITAL_COMMUNITY): Payer: Self-pay | Admitting: Emergency Medicine

## 2019-03-10 DIAGNOSIS — I2510 Atherosclerotic heart disease of native coronary artery without angina pectoris: Secondary | ICD-10-CM | POA: Diagnosis present

## 2019-03-10 DIAGNOSIS — E669 Obesity, unspecified: Secondary | ICD-10-CM | POA: Diagnosis not present

## 2019-03-10 DIAGNOSIS — K219 Gastro-esophageal reflux disease without esophagitis: Secondary | ICD-10-CM | POA: Diagnosis present

## 2019-03-10 DIAGNOSIS — Z7982 Long term (current) use of aspirin: Secondary | ICD-10-CM | POA: Diagnosis not present

## 2019-03-10 DIAGNOSIS — I252 Old myocardial infarction: Secondary | ICD-10-CM

## 2019-03-10 DIAGNOSIS — Z794 Long term (current) use of insulin: Secondary | ICD-10-CM | POA: Diagnosis not present

## 2019-03-10 DIAGNOSIS — G8929 Other chronic pain: Secondary | ICD-10-CM | POA: Diagnosis present

## 2019-03-10 DIAGNOSIS — R072 Precordial pain: Secondary | ICD-10-CM | POA: Diagnosis not present

## 2019-03-10 DIAGNOSIS — E785 Hyperlipidemia, unspecified: Secondary | ICD-10-CM | POA: Diagnosis present

## 2019-03-10 DIAGNOSIS — G4730 Sleep apnea, unspecified: Secondary | ICD-10-CM | POA: Diagnosis present

## 2019-03-10 DIAGNOSIS — F17210 Nicotine dependence, cigarettes, uncomplicated: Secondary | ICD-10-CM | POA: Diagnosis present

## 2019-03-10 DIAGNOSIS — Z20822 Contact with and (suspected) exposure to covid-19: Secondary | ICD-10-CM | POA: Diagnosis present

## 2019-03-10 DIAGNOSIS — Z8249 Family history of ischemic heart disease and other diseases of the circulatory system: Secondary | ICD-10-CM | POA: Diagnosis not present

## 2019-03-10 DIAGNOSIS — I130 Hypertensive heart and chronic kidney disease with heart failure and stage 1 through stage 4 chronic kidney disease, or unspecified chronic kidney disease: Secondary | ICD-10-CM | POA: Diagnosis present

## 2019-03-10 DIAGNOSIS — N183 Chronic kidney disease, stage 3 unspecified: Secondary | ICD-10-CM | POA: Diagnosis present

## 2019-03-10 DIAGNOSIS — I513 Intracardiac thrombosis, not elsewhere classified: Secondary | ICD-10-CM | POA: Diagnosis present

## 2019-03-10 DIAGNOSIS — Z9119 Patient's noncompliance with other medical treatment and regimen: Secondary | ICD-10-CM | POA: Diagnosis not present

## 2019-03-10 DIAGNOSIS — Z955 Presence of coronary angioplasty implant and graft: Secondary | ICD-10-CM

## 2019-03-10 DIAGNOSIS — M5126 Other intervertebral disc displacement, lumbar region: Secondary | ICD-10-CM | POA: Diagnosis present

## 2019-03-10 DIAGNOSIS — I255 Ischemic cardiomyopathy: Secondary | ICD-10-CM | POA: Diagnosis present

## 2019-03-10 DIAGNOSIS — I214 Non-ST elevation (NSTEMI) myocardial infarction: Principal | ICD-10-CM | POA: Diagnosis present

## 2019-03-10 DIAGNOSIS — Z7902 Long term (current) use of antithrombotics/antiplatelets: Secondary | ICD-10-CM | POA: Diagnosis not present

## 2019-03-10 DIAGNOSIS — E1165 Type 2 diabetes mellitus with hyperglycemia: Secondary | ICD-10-CM | POA: Diagnosis present

## 2019-03-10 DIAGNOSIS — Z79899 Other long term (current) drug therapy: Secondary | ICD-10-CM | POA: Diagnosis not present

## 2019-03-10 DIAGNOSIS — E119 Type 2 diabetes mellitus without complications: Secondary | ICD-10-CM | POA: Diagnosis not present

## 2019-03-10 DIAGNOSIS — Z833 Family history of diabetes mellitus: Secondary | ICD-10-CM | POA: Diagnosis not present

## 2019-03-10 DIAGNOSIS — E1122 Type 2 diabetes mellitus with diabetic chronic kidney disease: Secondary | ICD-10-CM | POA: Diagnosis present

## 2019-03-10 LAB — APTT: aPTT: 25 seconds (ref 24–36)

## 2019-03-10 LAB — BASIC METABOLIC PANEL
Anion gap: 8 (ref 5–15)
BUN: 20 mg/dL (ref 8–23)
CO2: 29 mmol/L (ref 22–32)
Calcium: 8.9 mg/dL (ref 8.9–10.3)
Chloride: 101 mmol/L (ref 98–111)
Creatinine, Ser: 1.49 mg/dL — ABNORMAL HIGH (ref 0.61–1.24)
GFR calc Af Amer: 57 mL/min — ABNORMAL LOW (ref >60)
GFR calc non Af Amer: 49 mL/min — ABNORMAL LOW (ref >60)
Glucose, Bld: 340 mg/dL — ABNORMAL HIGH (ref 70–99)
Potassium: 4.8 mmol/L (ref 3.5–5.1)
Sodium: 138 mmol/L (ref 135–145)

## 2019-03-10 LAB — CBC
HCT: 51 % (ref 39.0–52.0)
Hemoglobin: 15.7 g/dL (ref 13.0–17.0)
MCH: 25.5 pg — ABNORMAL LOW (ref 26.0–34.0)
MCHC: 30.8 g/dL (ref 30.0–36.0)
MCV: 82.8 fL (ref 80.0–100.0)
Platelets: 292 10*3/uL (ref 150–400)
RBC: 6.16 MIL/uL — ABNORMAL HIGH (ref 4.22–5.81)
RDW: 19 % — ABNORMAL HIGH (ref 11.5–15.5)
WBC: 12 10*3/uL — ABNORMAL HIGH (ref 4.0–10.5)
nRBC: 0 % (ref 0.0–0.2)

## 2019-03-10 LAB — PROTIME-INR
INR: 1 (ref 0.8–1.2)
Prothrombin Time: 13.2 seconds (ref 11.4–15.2)

## 2019-03-10 LAB — TROPONIN I (HIGH SENSITIVITY): Troponin I (High Sensitivity): 256 ng/L (ref <18)

## 2019-03-10 LAB — LIPASE, BLOOD: Lipase: 40 U/L (ref 11–51)

## 2019-03-10 MED ORDER — NITROGLYCERIN 2 % TD OINT
0.5000 [in_us] | TOPICAL_OINTMENT | Freq: Once | TRANSDERMAL | Status: AC
  Administered 2019-03-11: 0.5 [in_us] via TOPICAL
  Filled 2019-03-10: qty 1

## 2019-03-10 MED ORDER — SODIUM CHLORIDE 0.9% FLUSH: 3.0000 mL | Freq: Once | INTRAVENOUS | Status: DC

## 2019-03-10 MED ORDER — SODIUM CHLORIDE 0.9 % IV BOLUS
500.0000 mL | Freq: Once | INTRAVENOUS | Status: AC
  Administered 2019-03-11: 500 mL via INTRAVENOUS

## 2019-03-10 MED ORDER — HEPARIN (PORCINE) 25000 UT/250ML-% IV SOLN
1200.0000 [IU]/h | INTRAVENOUS | Status: DC
  Administered 2019-03-10: 1200 [IU]/h via INTRAVENOUS
  Filled 2019-03-10: qty 250

## 2019-03-10 MED ORDER — HEPARIN BOLUS VIA INFUSION
4000.0000 [IU] | Freq: Once | INTRAVENOUS | Status: AC
  Administered 2019-03-10: 4000 [IU] via INTRAVENOUS

## 2019-03-10 MED ORDER — ALUM & MAG HYDROXIDE-SIMETH 200-200-20 MG/5ML PO SUSP
30.0000 mL | Freq: Once | ORAL | Status: AC
  Administered 2019-03-10: 30 mL via ORAL
  Filled 2019-03-10: qty 30

## 2019-03-10 NOTE — ED Provider Notes (Signed)
[facility] EMERGENCY DEPARTMENT Provider Note   CSN: [id number] Arrival date & time: 03/10/19  2055     History Chief Complaint  Patient presents with  . Chest Pain    [name] is a 64 y.o. male with PMH significant for ACS s/p PCI, HTN, HLD, CKD, type II DM, and GERD who presents to the ED via EMS with chest pain.  According to EMS, patient was unconscious, unresponsive, gray, and diaphoretic when they arrived.  Patient informs me that he ate 2 sandwiches this morning and felt significant symptoms of gas and epigastrium and substernal chest pain.  Patient went to the bathroom to "gag himself" which has worked in the past when experiencing similar symptoms.   However, his symptoms failed to improve despite multiple episodes of vomiting.  He denies any blood or coffee grounds appearance of his emesis.  He reports that his chest pain resolved temporarily, but then returned.  Immediately prior to calling EMS, he reports having a mild headache with associated lightheadedness and feeling as though he may pass out.  After his wife called EMS, he subsequently went to the bathroom to defecate and EMS found him passed out on the toilet.  He has rales on the toilet and did not fall, nor did he hit his head.  He was a bit confused after being aroused and does not recall passing out.  EMS suspected vasovagal.  He denies any current headache or dizziness, shortness of breath, abdominal pain, nausea, urinary symptoms, change in bowel habits.  He reports that he went to rehabilitation for his cocaine use and has not participated in any alcohol or illicit drug use in years.  He continues to endorse 5 out of 10 substernal chest pain.  He states that he has seen a gastroenterologist in the past and was prescribed a medication, however he has not taken it in a very long time.  I reviewed patient's chart and he had a cardiac catheterization performed 10/16/2018 which demonstrated multivessel disease.    HPI      Past Medical History:  Diagnosis Date  . Bulging lumbar disc   . CAD (coronary artery disease) [phone]   a. prior LAD stenting. b. s/p DES to [facility] 08/2015 @ [facility]. c. 04/2016 Cardiac cath at [facility]. # vessel CAD, with patent stent in the PLAD and RCA. Diffuse dLAD, OM2, and  RPDA disease. No aortic stenosis. Elevated LVEDP. No lesion to suggest ACS. Continue medical managment.  . Chronic lower back pain   . CKD (chronic kidney disease), stage II   . Cocaine abuse ([place])   . DM2 (diabetes mellitus, type 2) ([place])   . GERD (gastroesophageal reflux disease)   . Headache    "often; not regular" (11/30/2017)  . HTN (hypertension)   . Hyperlipidemia   . Ischemic cardiomyopathy   . MI, old   . Obesity   . Pneumonia    hx  . Sleep apnea    "have machine; I don't use it" (11/30/2017)  . Tobacco use     Patient Active Problem List   Diagnosis Date Noted  . Unstable angina ([place]) 10/16/2018  . Apical mural thrombus 10/15/2018  . Claudication in peripheral vascular disease ([place]) 11/30/2017  . Peripheral arterial disease ([place]) 11/28/2017  . Chest pain 11/03/2016  . Constipation 11/03/2016  . CKD (chronic kidney disease), stage II 08/02/2016  . Cellulitis of right hand 08/02/2016  . Chronic pain 08/02/2016  . AKI (acute kidney injury) ([place])  01/31/2016  . Lobar pneumonia ([place]) 01/31/2016  . Influenza A 01/31/2016  . Diarrhea 01/30/2016  . NSVT (nonsustained ventricular tachycardia) ([place]) 09/12/2015  . Precordial chest pain 09/10/2015  . Cocaine use 09/04/2015  . Near syncope 06/29/2015  . Essential hypertension 06/29/2015  . Status post coronary artery stent placement 06/29/2015  . Insulin dependent diabetes mellitus 06/29/2015  . History of MI (myocardial infarction) 06/29/2015  . Chronic systolic CHF [id number]  . Musculoskeletal chest pain 05/27/2015  . OSA on CPAP 01/21/2015  . Rectal bleeding   . Stage 3 chronic renal impairment associated with type 2 diabetes  mellitus ([place]) 09/09/2013  . 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 CAD (coronary artery disease) 09/09/2013  . Cardiomyopathy, ischemic-EF 40-45% by echo 09/07/13 09/09/2013  . S/P LAD DES June 2014 09/06/2013  . Tobacco use disorder 09/06/2013  . Hyperglycemia 08/22/2012  . NSTEMI (non-ST elevated myocardial infarction) ([place]) 07/07/2012  . Dyslipidemia 07/07/2012  . Hypertensive heart disease     Past Surgical History:  Procedure Laterality Date  . APPENDECTOMY    . CARDIAC CATHETERIZATION N/A 09/07/2015   Procedure: Left Heart Cath and Coronary Angiography;  Surgeon: Leonie Man, MD;  Location: [place] CV LAB;  Service: Cardiovascular;  Laterality: N/A;  . CARDIAC CATHETERIZATION N/A 09/07/2015   Procedure: Coronary Stent Intervention;  Surgeon: Leonie Man, MD;  Location: [place] CV LAB;  Service: Cardiovascular;  Laterality: N/A;  . CORONARY ANGIOGRAM  09/07/13   residual RCA and OM disease  . CORONARY ANGIOPLASTY WITH STENT PLACEMENT    . CORONARY STENT INTERVENTION N/A 10/16/2018   Procedure: CORONARY STENT INTERVENTION;  Surgeon: Burnell Blanks, MD;  Location: [place] CV LAB;  Service: Cardiovascular;  Laterality: N/A;  . FRACTURE SURGERY    . INSERTION OF ILIAC STENT Left 11/30/2017   Left external illiac stent  . INSERTION OF ILIAC STENT  11/30/2017   Procedure: Insertion Of Iliac Stent;  Surgeon: Lorretta Harp, MD;  Location: [place] CV LAB;  Service: Cardiovascular;;  Left external illiac stent  . KNEE ARTHROSCOPY Left   . KNEE SURGERY     "ligaments, cartilage; tendon, put a pin in" (11/30/2017)  . LEFT HEART CATH Bilateral 07/08/2012   Procedure: LEFT HEART CATH;  Surgeon: [name], MD;  Location: [facility] CATH LAB;  Service: Cardiovascular;  Laterality: Bilateral;  . LEFT HEART CATH AND CORONARY ANGIOGRAPHY N/A 10/16/2018   Procedure: LEFT HEART CATH AND CORONARY ANGIOGRAPHY;  Surgeon: Burnell Blanks, MD;   Location: [place] CV LAB;  Service: Cardiovascular;  Laterality: N/A;  . LEFT HEART CATHETERIZATION WITH CORONARY ANGIOGRAM N/A 09/06/2013   STEMI, 2nd ISR LAD. Procedure: LEFT HEART CATHETERIZATION WITH CORONARY ANGIOGRAM;  Surgeon: [name], MD;  Location: [facility] CATH LAB;  Service: Cardiovascular;  Laterality: N/A;  . LOWER EXTREMITY ANGIOGRAPHY N/A 11/30/2017   Procedure: LOWER EXTREMITY ANGIOGRAPHY;  Surgeon: Lorretta Harp, MD;  Location: [place] CV LAB;  Service: Cardiovascular;  Laterality: N/A;  . PERCUTANEOUS CORONARY STENT INTERVENTION (PCI-S)  07/08/2012   Procedure: PERCUTANEOUS CORONARY STENT INTERVENTION (PCI-S);  Surgeon: [name], MD;  Location: [facility] CATH LAB;  Service: Cardiovascular;;  DES LAD  . PERCUTANEOUS CORONARY STENT INTERVENTION (PCI-S) N/A 09/06/2013   Procedure: PERCUTANEOUS CORONARY STENT INTERVENTION (PCI-S);  Surgeon: [name], MD;  Location: [facility] CATH LAB;  Service: Cardiovascular;  Laterality: N/A;  Mid LAD 3.0/24mm Promus  . WRIST FRACTURE SURGERY Bilateral  Family History  Problem Relation Age of Onset  . Hypertension Mother   . Diabetes Mother     Social History   Tobacco Use  . Smoking status: Former Smoker    Packs/day: 0.50    Years: 48.00    Pack years: 24.00    Types: Cigarettes  . Smokeless tobacco: Never Used  Substance Use Topics  . Alcohol use: Yes    Comment: 11/30/2017 "might drink a beer q 6 months"  . Drug use: No    Types: Cocaine    Comment: 11/30/2017 "last cocaine was ~ 1 wk ago"    Home Medications Prior to Admission medications   Medication Sig Start Date End Date Taking? Authorizing Provider  aspirin EC 81 MG tablet Take 1 tablet (81 mg total) by mouth daily. 10/17/18 10/17/19 Yes Daune Perch, NP  clopidogrel (PLAVIX) 75 MG tablet Take 1 tablet (75 mg total) by mouth daily with breakfast. 02/06/19 02/01/20 Yes Strader, [place] M, PA-C  insulin glargine (LANTUS) 100 UNIT/ML  injection Inject 30 Units into the skin at bedtime.    Yes [provider]  losartan (COZAAR) 25 MG tablet Take 0.5 tablets (12.5 mg total) by mouth daily. 02/06/19 05/07/19 Yes Strader, Fransisco Hertz, PA-C  metFORMIN (GLUCOPHAGE) 1000 MG tablet Take 1 tablet (1,000 mg total) by mouth 2 (two) times daily with a meal. Do not restart Metformin until Wednesday am 7/2 Patient taking differently: Take 1,000 mg by mouth 2 (two) times daily with a meal.  07/10/12  Yes Turner, Eber Hong, MD  metoprolol succinate (TOPROL-XL) 25 MG 24 hr tablet Take 1 tablet (25 mg total) by mouth daily. 10/18/18 10/18/19 Yes Daune Perch, NP  nitroGLYCERIN (NITROSTAT) 0.4 MG SL tablet Place 1 tablet (0.4 mg total) under the tongue every 5 (five) minutes x 3 doses as needed for chest pain. 10/05/18  Yes Herminio Commons, MD  pantoprazole (PROTONIX) 40 MG tablet Take 1 tablet (40 mg total) by mouth daily. 02/06/19 02/01/20 Yes Strader, Fransisco Hertz, PA-C  pravastatin (PRAVACHOL) 80 MG tablet Take 1 tablet (80 mg total) by mouth daily at 6 PM. 10/17/18 10/12/19 Yes Daune Perch, NP  sildenafil (VIAGRA) 50 MG tablet Take 1 tablet (50 mg total) by mouth daily as needed for erectile dysfunction. 02/06/19  Yes Strader, Fransisco Hertz, PA-C    Allergies    Patient has no known allergies.  Review of Systems   Review of Systems  Constitutional: Negative for chills and fever.  Cardiovascular: Positive for chest pain.  Gastrointestinal: Positive for vomiting.  Musculoskeletal: Negative for back pain.  Neurological: Negative for dizziness and headaches.    Physical Exam Updated Vital Signs BP 94/61 (BP Location: Left Arm)   Pulse 71   Temp 97.8 F (36.6 C) (Oral)   Resp 16   Ht 5\' 9"  (1.753 m)   Wt 105.7 kg   SpO2 96%   BMI 34.41 kg/m   Physical Exam Vitals and nursing note reviewed. Exam conducted with a chaperone present.  Constitutional:      Appearance: Normal appearance. He is obese.  HENT:     Head:  Normocephalic and atraumatic.  Eyes:     General: No scleral icterus.    Extraocular Movements: Extraocular movements intact.     Conjunctiva/sclera: Conjunctivae normal.     Pupils: Pupils are equal, round, and reactive to light.  Cardiovascular:     Rate and Rhythm: Normal rate and regular rhythm.     Pulses: Normal pulses.  Heart sounds: Normal heart sounds.  Pulmonary:     Effort: Pulmonary effort is normal. No respiratory distress.     Breath sounds: Normal breath sounds. No wheezing or rales.  Abdominal:     General: Abdomen is flat. There is no distension.     Palpations: Abdomen is soft.     Tenderness: There is no abdominal tenderness. There is no guarding.  Musculoskeletal:     Cervical back: Normal range of motion and neck supple. No rigidity.     Right lower leg: No edema.     Left lower leg: No edema.  Skin:    General: Skin is dry.     Capillary Refill: Capillary refill takes less than 2 seconds.  Neurological:     Mental Status: He is alert and oriented to person, place, and time.     GCS: GCS eye subscore is 4. GCS verbal subscore is 5. GCS motor subscore is 6.  Psychiatric:        Mood and Affect: Mood normal.        Behavior: Behavior normal.        Thought Content: Thought content normal.      ED Results / Procedures / Treatments   Labs (all labs ordered are listed, but only abnormal results are displayed) Labs Reviewed  BASIC METABOLIC PANEL - Abnormal; Notable for the following components:      Result Value   Glucose, Bld 340 (*)    Creatinine, Ser 1.49 (*)    GFR calc non Af Amer 49 (*)    GFR calc Af Amer 57 (*)    All other components within normal limits  CBC - Abnormal; Notable for the following components:   WBC 12.0 (*)    RBC 6.16 (*)    MCH 25.5 (*)    RDW 19.0 (*)    All other components within normal limits  TROPONIN I (HIGH SENSITIVITY) - Abnormal; Notable for the following components:   Troponin I (High Sensitivity) 256 (*)     All other components within normal limits  RESPIRATORY PANEL BY RT PCR (FLU A&B, COVID)  LIPASE, BLOOD  APTT  PROTIME-INR  RAPID URINE DRUG SCREEN, HOSP PERFORMED  TROPONIN I (HIGH SENSITIVITY)    EKG EKG Interpretation  Date/Time:  Sunday March 10 2019 23:43:03 EST Ventricular Rate:  67 PR Interval:    QRS Duration: 101 QT Interval:  432 QTC Calculation: 457 R Axis:   -110 Text Interpretation: Sinus rhythm Inferior infarct, old Anterolateral infarct, age indeterminate Baseline wander in lead(s) V6 since last tracing no significant change Confirmed by Noemi Chapel [phone]) on 03/10/2019 11:45:29 PM   Radiology DG Chest 2 View  Result Date: 03/10/2019 CLINICAL DATA:  64 year old male with chest pain. EXAM: CHEST - 2 VIEW COMPARISON:  Chest radiograph dated 10/21/2018. FINDINGS: There is stable cardiomegaly with mild vascular congestion. No focal consolidation, pleural effusion, pneumothorax. No acute osseous pathology. IMPRESSION: Stable cardiomegaly with mild vascular congestion. Electronically Signed   By: Anner Crete M.D.   On: 03/10/2019 21:48    Procedures Procedures (including critical care time)  Medications Ordered in ED Medications  sodium chloride flush (NS) 0.9 % injection 3 mL (3 mLs Intravenous Not Given 03/10/19 2152)  heparin bolus via infusion 4,000 Units (4,000 Units Intravenous Bolus from Bag 03/10/19 2356)    Followed by  heparin ADULT infusion 100 units/mL (25000 units/245mL sodium chloride 0.45%) (1,200 Units/hr Intravenous New Bag/Given 03/10/19 2356)  alum & mag hydroxide-simeth (MAALOX/MYLANTA)  200-200-20 MG/5ML suspension 30 mL (30 mLs Oral Given 03/10/19 2151)  sodium chloride 0.9 % bolus 500 mL (500 mLs Intravenous New Bag/Given 03/11/19 0024)  nitroGLYCERIN (NITROGLYN) 2 % ointment 0.5 inch (0.5 inches Topical Given 03/11/19 0024)  fentaNYL (SUBLIMAZE) injection 50 mcg (50 mcg Intravenous Given 03/11/19 0023)    ED Course  I have reviewed the triage  vital signs and the nursing notes.  Pertinent labs & imaging results that were available during my care of the patient were reviewed by me and considered in my medical decision making (see chart for details).    MDM Rules/Calculators/A&P                      Plain films obtained of chest demonstrate cardiomegaly.  EKG obtained via EMS demonstrated ST abnormalities that resolved on repeat EKG performed for thereafter which is why code STEMI was activated.  However, initial troponin elevated to 256 which in conjunction with his chest pain is consistent with NSTEMI.  Discussed case with Dr. Sabra Heck and will consult with cardiology for admission in setting of his NSTEMI.  Avoiding nitroglycerin drip given hypotension and instead started heparin drip.  COVID-19 testing obtained.  Fentanyl 50 mcg provided IV for analgesia.  Dr. Sabra Heck spoke with Dr. Neena Rhymes who accepted patient for admission at [facility] with plan for cardiac catheterization in the morning.    Final Clinical Impression(s) / ED Diagnoses Final diagnoses:  NSTEMI (non-ST elevated myocardial infarction) [facility])    Rx / [place] Orders ED Discharge Orders    None       Corena Herter, PA-C 03/11/19 0049    Noemi Chapel, MD 03/11/19 [phone]

## 2019-03-10 NOTE — Progress Notes (Signed)
ANTICOAGULATION CONSULT NOTE - Preliminary  Pharmacy Consult for heparin Indication: chest pain/ACS  No Known Allergies  Patient Measurements: Height: 5\' 9"  (175.3 cm) Weight: 233 lb (105.7 kg) IBW/kg (Calculated) : 70.7 HEPARIN DW (KG): 93.6   Vital Signs: Temp: 97.8 F (36.6 C) (02/28 2100) Temp Source: Oral (02/28 2100) BP: 94/61 (02/28 2100) Pulse Rate: 71 (02/28 2100)  Labs: Recent Labs    03/10/19 2208  HGB 15.7  HCT 51.0  PLT 292  CREATININE 1.49*   Estimated Creatinine Clearance: 60 mL/min (A) (by C-G formula based on SCr of 1.49 mg/dL (H)).  Medical History: Past Medical History:  Diagnosis Date  . Bulging lumbar disc   . CAD (coronary artery disease) [phone]   a. prior LAD stenting. b. s/p DES to [facility] 08/2015 @ [facility]. c. 04/2016 Cardiac cath at [facility] # vessel CAD, with patent stent in the PLAD and RCA. Diffuse dLAD, OM2, and  RPDA disease. No aortic stenosis. Elevated LVEDP. No lesion to suggest ACS. Continue medical managment.  . Chronic lower back pain   . CKD (chronic kidney disease), stage II   . Cocaine abuse ([place])   . DM2 (diabetes mellitus, type 2) ([place])   . GERD (gastroesophageal reflux disease)   . Headache    "often; not regular" (11/30/2017)  . HTN (hypertension)   . Hyperlipidemia   . Ischemic cardiomyopathy   . MI, old   . Obesity   . Pneumonia    hx  . Sleep apnea    "have machine; I don't use it" (11/30/2017)  . Tobacco use     Medications:  Infusions:  . heparin    . sodium chloride       Assessment: Pharmacy has been asked to dose heparin for ACS/STEMI for Mr. Satterlee, a 64 yo male with known CAD, heart cath in October 2020.   Goal of Therapy:  Heparin level 0.3-0.7 units/ml   Plan:  Give 4000 units bolus x 1 Start heparin infusion at 1200 units/hr Check anti-Xa level in 6 hours and daily while on heparin Continue to monitor H&H and platelets Preliminary review of pertinent patient information completed.   Forestine Na clinical pharmacist will complete review during morning rounds to assess the patient and finalize treatment regimen.  Nyra Capes, [facility] [employer] 03/10/2019,11:28 PM

## 2019-03-10 NOTE — ED Provider Notes (Signed)
This patient is a 64 year old male, he has a known history of coronary disease, type 2 diabetes, essential hypertension, he has had a coronary artery stent placed in the past.  He had a heart catheterization October 16, 2018 which showed multiple obstructive lesions across his heart, he had successful drug-eluting stent placement in the ramus intermedius branch.  He has known to have a 40% ejection fraction, history of cocaine use.  The patient reports that he had some chest pain earlier in the day which seems to have come back, it is mid chest pain, seems to be persistent this evening.  He appears generally weak, he is slightly hypotensive at 94/61 thus we will give him some fluids and avoid nitroglycerin drip or sublingual.  His heart rate is around 71 respirations are 16.  Labs show hyperglycemia with a glucose of 340 and a creatinine of 1.5 which is slightly higher than baseline.  He has a mild leukocytosis, unfortunately he has a troponin of 256 consistent with a non-ST elevation myocardial infarction.  He has cardiomegaly on the x-ray.  His EKG shows diffuse Q waves and poor R wave progression.  His prehospital tracings initially at 8:05 PM showed ST abnormalities that could be consistent with ischemia however a repeat EKG cardiac tracing prior to arrival that was performed approximately 12 minutes later showed resolution of those same findings.  His EKG here does not show the same ST elevations that were seen prehospital.  Due to this a STEMI was not activated  Given that he has ongoing symptoms with an elevated troponin we will discuss with cardiology, we will start heparin drip, he will need to be transferred to the cardiology service at [facility] at the cardiac center as he has known multivessel coronary disease, critical care provided for this very sick patient  I discussed the care with Dr. Neena Rhymes - who accepted care of the pt - plan to cath in the AM  CRITICAL CARE Performed by:  Johnna Acosta Total critical care time: 35 minutes Critical care time was exclusive of separately billable procedures and treating other patients. Critical care was necessary to treat or prevent imminent or life-threatening deterioration. Critical care was time spent personally by me on the following activities: development of treatment plan with patient and/or surrogate as well as nursing, discussions with consultants, evaluation of patient's response to treatment, examination of patient, obtaining history from patient or surrogate, ordering and performing treatments and interventions, ordering and review of laboratory studies, ordering and review of radiographic studies, pulse oximetry and re-evaluation of patient's condition.    EKG Interpretation  Date/Time:  Sunday March 10 2019 20:59:24 EST Ventricular Rate:  70 PR Interval:    QRS Duration: 100 QT Interval:  427 QTC Calculation: 461 R Axis:   -79 Text Interpretation: Sinus rhythm Inferior infarct, old Anterolateral infarct, age indeterminate since last tracing no significant change Confirmed by Noemi Chapel [phone]) on 03/10/2019 10:26:11 PM      [name] was evaluated in Emergency Department on 03/10/2019 for the symptoms described in the history of present illness. He was evaluated in the context of the global COVID-19 pandemic, which necessitated consideration that the patient might be at risk for infection with the SARS-CoV-2 virus that causes COVID-19. Institutional protocols and algorithms that pertain to the evaluation of patients at risk for COVID-19 are in a state of rapid change based on information released by regulatory bodies including the CDC and federal and state organizations. These policies  and algorithms were followed during the patient's care in the ED.  Final diagnoses:  NSTEMI (non-ST elevated myocardial infarction) ([place])       Noemi Chapel, MD 03/11/19 1459

## 2019-03-10 NOTE — ED Triage Notes (Signed)
Per [facility] pt from home c/o chest pain has history of multiple MI's and cardiac history. Pt was found unconscious and unresponsive when ems arrived, pt was grey and diaphoretic. Pt was given fentanyl 100 mcg before arrival.  Pt denies cp now and c/o back pain

## 2019-03-10 NOTE — ED Notes (Signed)
Date and time results received: 03/10/19 11:08 PM Test: troponin Critical Value: 1208 Name of Provider Notified: green, PA Orders Received? Or Actions Taken?: pa notified

## 2019-03-11 ENCOUNTER — Inpatient Hospital Stay (HOSPITAL_COMMUNITY): Payer: No Typology Code available for payment source

## 2019-03-11 ENCOUNTER — Other Ambulatory Visit: Payer: Self-pay

## 2019-03-11 ENCOUNTER — Encounter (HOSPITAL_COMMUNITY)
Admission: EM | Disposition: A | Discharge status: Home / Self Care | Payer: Self-pay | Source: Home / Self Care | Attending: Cardiology

## 2019-03-11 DIAGNOSIS — I214 Non-ST elevation (NSTEMI) myocardial infarction: Principal | ICD-10-CM

## 2019-03-11 DIAGNOSIS — E119 Type 2 diabetes mellitus without complications: Secondary | ICD-10-CM

## 2019-03-11 DIAGNOSIS — I2510 Atherosclerotic heart disease of native coronary artery without angina pectoris: Secondary | ICD-10-CM

## 2019-03-11 HISTORY — PX: CORONARY STENT INTERVENTION: CATH118234

## 2019-03-11 HISTORY — PX: LEFT HEART CATH AND CORONARY ANGIOGRAPHY: CATH118249

## 2019-03-11 LAB — BASIC METABOLIC PANEL
Anion gap: 10 (ref 5–15)
BUN: 16 mg/dL (ref 8–23)
CO2: 30 mmol/L (ref 22–32)
Calcium: 8.9 mg/dL (ref 8.9–10.3)
Chloride: 99 mmol/L (ref 98–111)
Creatinine, Ser: 1.51 mg/dL — ABNORMAL HIGH (ref 0.61–1.24)
GFR calc Af Amer: 56 mL/min — ABNORMAL LOW (ref >60)
GFR calc non Af Amer: 48 mL/min — ABNORMAL LOW (ref >60)
Glucose, Bld: 303 mg/dL — ABNORMAL HIGH (ref 70–99)
Potassium: 4.6 mmol/L (ref 3.5–5.1)
Sodium: 139 mmol/L (ref 135–145)

## 2019-03-11 LAB — CBC
HCT: 48.6 % (ref 39.0–52.0)
Hemoglobin: 15.1 g/dL (ref 13.0–17.0)
MCH: 25.2 pg — ABNORMAL LOW (ref 26.0–34.0)
MCHC: 31.1 g/dL (ref 30.0–36.0)
MCV: 81.1 fL (ref 80.0–100.0)
Platelets: 282 10*3/uL (ref 150–400)
RBC: 5.99 MIL/uL — ABNORMAL HIGH (ref 4.22–5.81)
RDW: 18.5 % — ABNORMAL HIGH (ref 11.5–15.5)
WBC: 11.7 10*3/uL — ABNORMAL HIGH (ref 4.0–10.5)
nRBC: 0 % (ref 0.0–0.2)

## 2019-03-11 LAB — ECHOCARDIOGRAM COMPLETE
Height: 69 in
Weight: 3636.71 oz

## 2019-03-11 LAB — POCT ACTIVATED CLOTTING TIME
Activated Clotting Time: 852 seconds
Activated Clotting Time: 852 seconds
Activated Clotting Time: 208 seconds
Activated Clotting Time: 175 seconds

## 2019-03-11 LAB — GLUCOSE, CAPILLARY
Glucose-Capillary: 287 mg/dL — ABNORMAL HIGH (ref 70–99)
Glucose-Capillary: 177 mg/dL — ABNORMAL HIGH (ref 70–99)
Glucose-Capillary: 115 mg/dL — ABNORMAL HIGH (ref 70–99)
Glucose-Capillary: 220 mg/dL — ABNORMAL HIGH (ref 70–99)
Glucose-Capillary: 280 mg/dL — ABNORMAL HIGH (ref 70–99)

## 2019-03-11 LAB — RESPIRATORY PANEL BY RT PCR (FLU A&B, COVID)
Influenza A by PCR: NEGATIVE
Influenza B by PCR: NEGATIVE
SARS Coronavirus 2 by RT PCR: NEGATIVE

## 2019-03-11 LAB — HEMOGLOBIN A1C
Hgb A1c MFr Bld: 11.7 % — ABNORMAL HIGH (ref 4.8–5.6)
Mean Plasma Glucose: 289.09 mg/dL

## 2019-03-11 LAB — HEPARIN LEVEL (UNFRACTIONATED): Heparin Unfractionated: 0.36 IU/mL (ref 0.30–0.70)

## 2019-03-11 LAB — CBG MONITORING, ED: Glucose-Capillary: 294 mg/dL — ABNORMAL HIGH (ref 70–99)

## 2019-03-11 LAB — TROPONIN I (HIGH SENSITIVITY): Troponin I (High Sensitivity): 575 ng/L (ref <18)

## 2019-03-11 LAB — MRSA PCR SCREENING: MRSA by PCR: NEGATIVE

## 2019-03-11 SURGERY — LEFT HEART CATH AND CORONARY ANGIOGRAPHY
Anesthesia: LOCAL

## 2019-03-11 MED ORDER — PRAVASTATIN SODIUM 40 MG PO TABS
80.0000 mg | ORAL_TABLET | Freq: Every day | ORAL | Status: DC
  Administered 2019-03-11: 80 mg via ORAL
  Filled 2019-03-11: qty 2

## 2019-03-11 MED ORDER — MIDAZOLAM HCL 2 MG/2ML IJ SOLN
INTRAMUSCULAR | Status: AC
  Filled 2019-03-11: qty 2

## 2019-03-11 MED ORDER — SODIUM CHLORIDE 0.9 % IV SOLN: 250.0000 mL | INTRAVENOUS | Status: DC | PRN

## 2019-03-11 MED ORDER — ASPIRIN 81 MG PO CHEW: 81.0000 mg | CHEWABLE_TABLET | ORAL | Status: DC

## 2019-03-11 MED ORDER — SODIUM CHLORIDE 0.9 % WEIGHT BASED INFUSION: 3.0000 mL/kg/h | INTRAVENOUS | Status: DC

## 2019-03-11 MED ORDER — HEPARIN (PORCINE) IN NACL 1000-0.9 UT/500ML-% IV SOLN
INTRAVENOUS | Status: AC
  Filled 2019-03-11: qty 500

## 2019-03-11 MED ORDER — SODIUM CHLORIDE 0.9% FLUSH: 3.0000 mL | INTRAVENOUS | Status: DC | PRN

## 2019-03-11 MED ORDER — MIDAZOLAM HCL 2 MG/2ML IJ SOLN
INTRAMUSCULAR | Status: DC | PRN
  Administered 2019-03-11: 2 mg via INTRAVENOUS
  Administered 2019-03-11 (×2): 1 mg via INTRAVENOUS

## 2019-03-11 MED ORDER — SODIUM CHLORIDE 0.9 % WEIGHT BASED INFUSION: 1.0000 mL/kg/h | INTRAVENOUS | Status: DC

## 2019-03-11 MED ORDER — HEPARIN SODIUM (PORCINE) 1000 UNIT/ML IJ SOLN
INTRAMUSCULAR | Status: DC | PRN
  Administered 2019-03-11: 9000 [IU] via INTRAVENOUS

## 2019-03-11 MED ORDER — NITROGLYCERIN 0.4 MG SL SUBL
0.4000 mg | SUBLINGUAL_TABLET | SUBLINGUAL | Status: DC | PRN
  Administered 2019-03-11 – 2019-03-12 (×3): 0.4 mg via SUBLINGUAL
  Filled 2019-03-11 (×2): qty 1

## 2019-03-11 MED ORDER — IOHEXOL 350 MG/ML SOLN
INTRAVENOUS | Status: DC | PRN
  Administered 2019-03-11: 95 mL

## 2019-03-11 MED ORDER — SODIUM CHLORIDE 0.9 % IV SOLN: INTRAVENOUS | Status: AC

## 2019-03-11 MED ORDER — SODIUM CHLORIDE 0.9% FLUSH
3.0000 mL | Freq: Two times a day (BID) | INTRAVENOUS | Status: DC
  Administered 2019-03-11 – 2019-03-12 (×3): 3 mL via INTRAVENOUS

## 2019-03-11 MED ORDER — CLOPIDOGREL BISULFATE 75 MG PO TABS: 75.0000 mg | ORAL_TABLET | Freq: Every day | ORAL | Status: DC

## 2019-03-11 MED ORDER — LOSARTAN POTASSIUM 25 MG PO TABS
12.5000 mg | ORAL_TABLET | Freq: Every day | ORAL | Status: DC
  Administered 2019-03-11 – 2019-03-12 (×2): 12.5 mg via ORAL
  Filled 2019-03-11 (×2): qty 1

## 2019-03-11 MED ORDER — PANTOPRAZOLE SODIUM 40 MG PO TBEC
40.0000 mg | DELAYED_RELEASE_TABLET | Freq: Every day | ORAL | Status: DC
  Administered 2019-03-11 – 2019-03-12 (×2): 40 mg via ORAL
  Filled 2019-03-11 (×2): qty 1

## 2019-03-11 MED ORDER — ONDANSETRON HCL 4 MG/2ML IJ SOLN: 4.0000 mg | Freq: Four times a day (QID) | INTRAMUSCULAR | Status: DC | PRN

## 2019-03-11 MED ORDER — MORPHINE SULFATE (PF) 2 MG/ML IV SOLN
2.0000 mg | INTRAVENOUS | Status: DC | PRN
  Administered 2019-03-11 – 2019-03-12 (×3): 2 mg via INTRAVENOUS
  Filled 2019-03-11 (×2): qty 1

## 2019-03-11 MED ORDER — ACETAMINOPHEN 325 MG PO TABS: 650.0000 mg | ORAL_TABLET | ORAL | Status: DC | PRN

## 2019-03-11 MED ORDER — IOHEXOL 350 MG/ML SOLN
INTRAVENOUS | Status: AC
  Filled 2019-03-11: qty 1

## 2019-03-11 MED ORDER — NICOTINE 14 MG/24HR TD PT24
14.0000 mg | MEDICATED_PATCH | Freq: Every day | TRANSDERMAL | Status: DC
  Administered 2019-03-11 – 2019-03-12 (×2): 14 mg via TRANSDERMAL
  Filled 2019-03-11 (×3): qty 1

## 2019-03-11 MED ORDER — SODIUM CHLORIDE 0.9% FLUSH
3.0000 mL | Freq: Two times a day (BID) | INTRAVENOUS | Status: DC
  Administered 2019-03-12: 11:00:00 3 mL via INTRAVENOUS

## 2019-03-11 MED ORDER — LIDOCAINE HCL (PF) 1 % IJ SOLN
INTRAMUSCULAR | Status: AC
  Filled 2019-03-11: qty 30

## 2019-03-11 MED ORDER — HEPARIN SODIUM (PORCINE) 1000 UNIT/ML IJ SOLN
INTRAMUSCULAR | Status: AC
  Filled 2019-03-11: qty 1

## 2019-03-11 MED ORDER — INSULIN ASPART 100 UNIT/ML ~~LOC~~ SOLN
0.0000 [IU] | Freq: Every day | SUBCUTANEOUS | Status: DC
  Administered 2019-03-11: 22:00:00 3 [IU] via SUBCUTANEOUS

## 2019-03-11 MED ORDER — METOPROLOL SUCCINATE ER 25 MG PO TB24
25.0000 mg | ORAL_TABLET | Freq: Every day | ORAL | Status: DC
  Administered 2019-03-11 – 2019-03-12 (×2): 25 mg via ORAL
  Filled 2019-03-11 (×2): qty 1

## 2019-03-11 MED ORDER — CLOPIDOGREL BISULFATE 300 MG PO TABS
300.0000 mg | ORAL_TABLET | Freq: Once | ORAL | Status: AC
  Administered 2019-03-11: 04:00:00 300 mg via ORAL
  Filled 2019-03-11: qty 1

## 2019-03-11 MED ORDER — CLOPIDOGREL BISULFATE 75 MG PO TABS
75.0000 mg | ORAL_TABLET | Freq: Every day | ORAL | Status: DC
  Administered 2019-03-12: 75 mg via ORAL
  Filled 2019-03-11: qty 1

## 2019-03-11 MED ORDER — INSULIN GLARGINE 100 UNIT/ML ~~LOC~~ SOLN
30.0000 [IU] | Freq: Every day | SUBCUTANEOUS | Status: DC
  Administered 2019-03-11: 30 [IU] via SUBCUTANEOUS
  Filled 2019-03-11 (×2): qty 0.3

## 2019-03-11 MED ORDER — INSULIN ASPART 100 UNIT/ML ~~LOC~~ SOLN
0.0000 [IU] | Freq: Three times a day (TID) | SUBCUTANEOUS | Status: DC
  Administered 2019-03-11: 2 [IU] via SUBCUTANEOUS
  Administered 2019-03-11: 12:00:00 3 [IU] via SUBCUTANEOUS
  Administered 2019-03-11: 8 [IU] via SUBCUTANEOUS
  Administered 2019-03-12: 12:00:00 3 [IU] via SUBCUTANEOUS
  Administered 2019-03-12: 8 [IU] via SUBCUTANEOUS

## 2019-03-11 MED ORDER — LIDOCAINE HCL (PF) 1 % IJ SOLN
INTRAMUSCULAR | Status: DC | PRN
  Administered 2019-03-11: 2 mL

## 2019-03-11 MED ORDER — HYDRALAZINE HCL 20 MG/ML IJ SOLN: 10.0000 mg | INTRAMUSCULAR | Status: AC | PRN

## 2019-03-11 MED ORDER — ASPIRIN EC 81 MG PO TBEC
81.0000 mg | DELAYED_RELEASE_TABLET | Freq: Every day | ORAL | Status: DC
  Administered 2019-03-11 – 2019-03-12 (×2): 81 mg via ORAL
  Filled 2019-03-11 (×2): qty 1

## 2019-03-11 MED ORDER — MORPHINE SULFATE (PF) 2 MG/ML IV SOLN
INTRAVENOUS | Status: AC
  Filled 2019-03-11: qty 1

## 2019-03-11 MED ORDER — ASPIRIN 81 MG PO CHEW: 81.0000 mg | CHEWABLE_TABLET | Freq: Every day | ORAL | Status: DC

## 2019-03-11 MED ORDER — FENTANYL CITRATE (PF) 100 MCG/2ML IJ SOLN
INTRAMUSCULAR | Status: AC
  Filled 2019-03-11: qty 2

## 2019-03-11 MED ORDER — HEPARIN (PORCINE) IN NACL 1000-0.9 UT/500ML-% IV SOLN
INTRAVENOUS | Status: DC | PRN
  Administered 2019-03-11 (×2): 500 mL

## 2019-03-11 MED ORDER — FENTANYL CITRATE (PF) 100 MCG/2ML IJ SOLN
INTRAMUSCULAR | Status: DC | PRN
  Administered 2019-03-11 (×2): 25 ug via INTRAVENOUS
  Administered 2019-03-11: 50 ug via INTRAVENOUS
  Administered 2019-03-11: 25 ug via INTRAVENOUS

## 2019-03-11 MED ORDER — FENTANYL CITRATE (PF) 100 MCG/2ML IJ SOLN
50.0000 ug | Freq: Once | INTRAMUSCULAR | Status: AC
  Administered 2019-03-11: 50 ug via INTRAVENOUS
  Filled 2019-03-11: qty 2

## 2019-03-11 MED ORDER — PERFLUTREN LIPID MICROSPHERE
1.0000 mL | INTRAVENOUS | Status: AC | PRN
  Administered 2019-03-11: 2 mL via INTRAVENOUS
  Filled 2019-03-11: qty 10

## 2019-03-11 MED ORDER — LABETALOL HCL 5 MG/ML IV SOLN: 10.0000 mg | INTRAVENOUS | Status: AC | PRN

## 2019-03-11 SURGICAL SUPPLY — 20 items
BALLN EMERGE MR 2.25X15 (BALLOONS) ×2
BALLN SAPPHIRE ~~LOC~~ 3.25X15 (BALLOONS) ×1 IMPLANT
BALLOON EMERGE MR 2.25X15 (BALLOONS) IMPLANT
CATH DXT MULTI JL4 JR4 ANG PIG (CATHETERS) ×1 IMPLANT
CATH LAUNCHER 6FR JR4 (CATHETERS) ×1 IMPLANT
KIT ENCORE 26 ADVANTAGE (KITS) ×1 IMPLANT
KIT HEART LEFT (KITS) ×2 IMPLANT
KIT HEMO VALVE WATCHDOG (MISCELLANEOUS) ×1 IMPLANT
PACK CARDIAC CATHETERIZATION (CUSTOM PROCEDURE TRAY) ×2 IMPLANT
SHEATH PINNACLE 5F 10CM (SHEATH) ×1 IMPLANT
SHEATH PINNACLE 6F 10CM (SHEATH) ×1 IMPLANT
SHEATH PROBE COVER 6X72 (BAG) ×1 IMPLANT
STENT SYNERGY XD 2.50X28 (Permanent Stent) IMPLANT
STENT SYNERGY XD 3.0X28 (Permanent Stent) IMPLANT
SYNERGY XD 2.50X28 (Permanent Stent) ×2 IMPLANT
SYNERGY XD 3.0X28 (Permanent Stent) ×2 IMPLANT
TRANSDUCER W/STOPCOCK (MISCELLANEOUS) ×2 IMPLANT
TUBING CIL FLEX 10 FLL-RA (TUBING) ×2 IMPLANT
WIRE ASAHI PROWATER 180CM (WIRE) ×1 IMPLANT
WIRE EMERALD 3MM-J .035X150CM (WIRE) ×1 IMPLANT

## 2019-03-11 NOTE — H&P (Signed)
Cardiology Admission History and Physical:   Patient ID: [name] MRN: [id number]; DOB: [birth year]   Admission date: 03/10/2019  Primary Care Provider: System, [place] Not In Primary Cardiologist: [name], MD  Primary Electrophysiologist:  None   Chief Complaint:  Chest pain, NSTEMI  Patient Profile:   [name] is a 64 y.o. male with history of severe multivessel CAD s/p prior PCI, CKD, HF w borderline EF, HLD, substance abuse, HTN, PAD s/p external iliac stenting, DM2, and intracardiac thrombus (2020) presenting with chest pain and elevated troponin.   History of Present Illness:   [name] reports chest pain starting early in the morning of 2/28 shortly after eating.  He called EMS and when EMS arrived he was found unconscious. He had a pulse and did not fall or experience any trauma. EMS suspected a vasovagal episode. He was brought to the [facility] ED. At [facility], the patient's ECG showed diffuse Q waves but no ST elevation. His initial troponin was 256 and follow up was 575. He was given aspirin and started on a heparin drip. He also received fluids and SL nitro for continued chest pain. He was transferred to [facility] for further management.   On arrival to [facility], the patient states he is chest pain free and asymptomatic. He continues to smoke cigarettes every day. He states that he has not taken any of his medications for several weeks. He will take insulin "every once in a while." He has trouble remembering his medications and does not like being on medications.   Heart Pathway Score:     Past Medical History:  Diagnosis Date  . Bulging lumbar disc   . CAD (coronary artery disease) [phone]   a. prior LAD stenting. b. s/p DES to [facility] 08/2015 @ [facility]. c. 04/2016 Cardiac cath at [facility] # vessel CAD, with patent stent in the PLAD and RCA. Diffuse dLAD, OM2, and  RPDA disease. No aortic stenosis. Elevated LVEDP. No lesion to suggest ACS.  Continue medical managment.  . Chronic lower back pain   . CKD (chronic kidney disease), stage II   . Cocaine abuse ([place])   . DM2 (diabetes mellitus, type 2) ([place])   . GERD (gastroesophageal reflux disease)   . Headache    "often; not regular" (11/30/2017)  . HTN (hypertension)   . Hyperlipidemia   . Ischemic cardiomyopathy   . MI, old   . Obesity   . Pneumonia    hx  . Sleep apnea    "have machine; I don't use it" (11/30/2017)  . Tobacco use     Past Surgical History:  Procedure Laterality Date  . APPENDECTOMY    . CARDIAC CATHETERIZATION N/A 09/07/2015   Procedure: Left Heart Cath and Coronary Angiography;  Surgeon: Leonie Man, MD;  Location: [place] CV LAB;  Service: Cardiovascular;  Laterality: N/A;  . CARDIAC CATHETERIZATION N/A 09/07/2015   Procedure: Coronary Stent Intervention;  Surgeon: Leonie Man, MD;  Location: [place] CV LAB;  Service: Cardiovascular;  Laterality: N/A;  . CORONARY ANGIOGRAM  09/07/13   residual RCA and OM disease  . CORONARY ANGIOPLASTY WITH STENT PLACEMENT    . CORONARY STENT INTERVENTION N/A 10/16/2018   Procedure: CORONARY STENT INTERVENTION;  Surgeon: Burnell Blanks, MD;  Location: [place] CV LAB;  Service: Cardiovascular;  Laterality: N/A;  . FRACTURE SURGERY    . INSERTION OF ILIAC STENT Left 11/30/2017   Left external illiac stent  .  INSERTION OF ILIAC STENT  11/30/2017   Procedure: Insertion Of Iliac Stent;  Surgeon: Lorretta Harp, MD;  Location: [place] CV LAB;  Service: Cardiovascular;;  Left external illiac stent  . KNEE ARTHROSCOPY Left   . KNEE SURGERY     "ligaments, cartilage; tendon, put a pin in" (11/30/2017)  . LEFT HEART CATH Bilateral 07/08/2012   Procedure: LEFT HEART CATH;  Surgeon: [name], MD;  Location: [facility] CATH LAB;  Service: Cardiovascular;  Laterality: Bilateral;  . LEFT HEART CATH AND CORONARY ANGIOGRAPHY N/A 10/16/2018   Procedure: LEFT HEART CATH AND CORONARY ANGIOGRAPHY;   Surgeon: Burnell Blanks, MD;  Location: [place] CV LAB;  Service: Cardiovascular;  Laterality: N/A;  . LEFT HEART CATHETERIZATION WITH CORONARY ANGIOGRAM N/A 09/06/2013   STEMI, 2nd ISR LAD. Procedure: LEFT HEART CATHETERIZATION WITH CORONARY ANGIOGRAM;  Surgeon: [name], MD;  Location: [facility] CATH LAB;  Service: Cardiovascular;  Laterality: N/A;  . LOWER EXTREMITY ANGIOGRAPHY N/A 11/30/2017   Procedure: LOWER EXTREMITY ANGIOGRAPHY;  Surgeon: Lorretta Harp, MD;  Location: [place] CV LAB;  Service: Cardiovascular;  Laterality: N/A;  . PERCUTANEOUS CORONARY STENT INTERVENTION (PCI-S)  07/08/2012   Procedure: PERCUTANEOUS CORONARY STENT INTERVENTION (PCI-S);  Surgeon: [name], MD;  Location: [facility] CATH LAB;  Service: Cardiovascular;;  DES LAD  . PERCUTANEOUS CORONARY STENT INTERVENTION (PCI-S) N/A 09/06/2013   Procedure: PERCUTANEOUS CORONARY STENT INTERVENTION (PCI-S);  Surgeon: [name], MD;  Location: [facility] CATH LAB;  Service: Cardiovascular;  Laterality: N/A;  Mid LAD 3.0/24mm Promus  . WRIST FRACTURE SURGERY Bilateral      Medications Prior to Admission: Prior to Admission medications   Medication Sig Start Date End Date Taking? Authorizing Provider  aspirin EC 81 MG tablet Take 1 tablet (81 mg total) by mouth daily. 10/17/18 10/17/19 Yes Daune Perch, NP  clopidogrel (PLAVIX) 75 MG tablet Take 1 tablet (75 mg total) by mouth daily with breakfast. 02/06/19 02/01/20 Yes Strader, [place] M, PA-C  insulin glargine (LANTUS) 100 UNIT/ML injection Inject 30 Units into the skin at bedtime.    Yes [provider]  losartan (COZAAR) 25 MG tablet Take 0.5 tablets (12.5 mg total) by mouth daily. 02/06/19 05/07/19 Yes Strader, Fransisco Hertz, PA-C  metFORMIN (GLUCOPHAGE) 1000 MG tablet Take 1 tablet (1,000 mg total) by mouth 2 (two) times daily with a meal. Do not restart Metformin until Wednesday am 7/2 Patient taking differently: Take 1,000 mg by mouth 2  (two) times daily with a meal.  07/10/12  Yes Turner, Eber Hong, MD  metoprolol succinate (TOPROL-XL) 25 MG 24 hr tablet Take 1 tablet (25 mg total) by mouth daily. 10/18/18 10/18/19 Yes Daune Perch, NP  nitroGLYCERIN (NITROSTAT) 0.4 MG SL tablet Place 1 tablet (0.4 mg total) under the tongue every 5 (five) minutes x 3 doses as needed for chest pain. 10/05/18  Yes Herminio Commons, MD  pantoprazole (PROTONIX) 40 MG tablet Take 1 tablet (40 mg total) by mouth daily. 02/06/19 02/01/20 Yes Strader, Fransisco Hertz, PA-C  pravastatin (PRAVACHOL) 80 MG tablet Take 1 tablet (80 mg total) by mouth daily at 6 PM. 10/17/18 10/12/19 Yes Daune Perch, NP  sildenafil (VIAGRA) 50 MG tablet Take 1 tablet (50 mg total) by mouth daily as needed for erectile dysfunction. 02/06/19  Yes Strader, Fransisco Hertz, PA-C     Allergies:   No Known Allergies  Social History:   Social History   Socioeconomic History  . Marital status: Divorced  Spouse name: Not on file  . Number of children: Not on file  . Years of education: Not on file  . Highest education level: Not on file  Occupational History  . Not on file  Tobacco Use  . Smoking status: Former Smoker    Packs/day: 0.50    Years: 48.00    Pack years: 24.00    Types: Cigarettes  . Smokeless tobacco: Never Used  Substance and Sexual Activity  . Alcohol use: Yes    Comment: 11/30/2017 "might drink a beer q 6 months"  . Drug use: No    Types: Cocaine    Comment: 11/30/2017 "last cocaine was ~ 1 wk ago"  . Sexual activity: Yes  Other Topics Concern  . Not on file  Social History Narrative   ** Merged History Encounter **       Social Determinants of Health   Financial Resource Strain:   . Difficulty of Paying Living Expenses: Not on file  Food Insecurity:   . Worried About Charity fundraiser in the Last Year: Not on file  . Ran Out of Food in the Last Year: Not on file  Transportation Needs:   . Lack of Transportation (Medical): Not on file  . Lack  of Transportation (Non-Medical): Not on file  Physical Activity:   . Days of Exercise per Week: Not on file  . Minutes of Exercise per Session: Not on file  Stress:   . Feeling of Stress : Not on file  Social Connections:   . Frequency of Communication with Friends and Family: Not on file  . Frequency of Social Gatherings with Friends and Family: Not on file  . Attends Religious Services: Not on file  . Active Member of Clubs or Organizations: Not on file  . Attends Archivist Meetings: Not on file  . Marital Status: Not on file  Intimate Partner Violence:   . Fear of Current or Ex-Partner: Not on file  . Emotionally Abused: Not on file  . Physically Abused: Not on file  . Sexually Abused: Not on file    Family History:   The patient's family history includes Diabetes in his mother; Hypertension in his mother.    ROS:  Please see the history of present illness.  All other ROS reviewed and negative.     Physical Exam/Data:   Vitals:   03/11/19 0000 03/11/19 0015 03/11/19 0030 03/11/19 0045  BP: 115/85  113/80   Pulse: 65 65 70 67  Resp: 15 13 (!) 28 (!) 22  Temp:      TempSrc:      SpO2: 99% 96% 92% 99%  Weight:      Height:       No intake or output data in the 24 hours ending 03/11/19 0219 Last 3 Weights 03/10/2019 01/10/2019 10/26/2018  Weight (lbs) 233 lb 235 lb 238 lb  Weight (kg) 105.688 kg 106.595 kg 107.956 kg     Body mass index is 34.41 kg/m.  General:  Well nourished, well developed, in no acute distress HEENT: normal Neck: no JVD Cardiac:  normal S1, S2; RRR; no murmur  Lungs:  clear to auscultation bilaterally, no wheezing, rhonchi or rales  Abd: soft, nontender, no hepatomegaly, moderately distended, no masses Ext: cool extremities, very weak distal pulses, no edema Skin: warm and dry   EKG:  The ECG that was done on arrival to [facility] ED was personally reviewed and demonstrates NSR, HR 70bpm, inferior Q  waves, anteroseptal Q waves,  nonspecific ST/T wave changes, unchanged from prior ECG  Relevant CV Studies: Echo 10/2018  1. Left ventricular ejection fraction, by visual estimation, is 40%. The  left ventricle has moderately decreased function. Normal left ventricular  size. There is mildly increased left ventricular hypertrophy. No obvious  apical thrombus. Consider limited  echo with contrast for more optimal apical visualization.  2. Multiple segmental abnormalities exist. See findings.  3. Elevated left ventricular end-diastolic pressure.  4. Left ventricular diastolic Doppler parameters are consistent with  pseudonormalization pattern of LV diastolic filling.  5. Global right ventricle has normal systolic function.The right  ventricular size is normal. No increase in right ventricular wall  thickness.  6. Left atrial size was normal.  7. Right atrial size was normal.  8. Mild to moderate aortic valve annular calcification.  9. The mitral valve is grossly normal. Mild mitral valve regurgitation.  10. The tricuspid valve is grossly normal. Tricuspid valve regurgitation  is trivial.  11. The aortic valve is tricuspid Aortic valve regurgitation was not  visualized by color flow Doppler. Mild aortic valve sclerosis without  stenosis.  12. The pulmonic valve was grossly normal. Pulmonic valve regurgitation is  not visualized by color flow Doppler.  13. The inferior vena cava is normal in size with greater than 50%  respiratory variability, suggesting right atrial pressure of 3 mmHg.   10/2018 Cath:  Colon Flattery 2nd Diag lesion is 80% stenosed.  1st Mrg-1 lesion is 99% stenosed.  1st Mrg-2 lesion is 90% stenosed.  Dist RCA lesion is 80% stenosed.  Ost RPDA lesion is 70% stenosed.  Balloon angioplasty was performed.  RPDA lesion is 90% stenosed.  Prox RCA lesion is 40% stenosed.  Mid RCA lesion is 10% stenosed.  Prox LAD to Mid LAD lesion is 10% stenosed.  Prox LAD lesion is 40%  stenosed.  Dist LAD-1 lesion is 65% stenosed.  Dist LAD-2 lesion is 99% stenosed.  Mid LAD to Dist LAD lesion is 40% stenosed.  Ramus lesion is 90% stenosed.  Mid Cx to Dist Cx lesion is 90% stenosed.  A drug-eluting stent was successfully placed.  Post intervention, there is a 0% residual stenosis.   1. Triple vessel CAD with diffuse, diabetic appearance of the coronary arteries 2. The LAD has a mild to moderate proximal stenosis which is unchanged from last cath. The mid LAD stent is patent with minimal restenosis. The distal LAD has diffuse severe disease, unchanged from last cath. This is not a suitable vessel for PCI distally.  3. The Circumflex has severe diffuse disease in the obtuse marginal branch which is unchanged from the last cath and not suitable for PCI. The distal Circumflex has diffuse disease.  4. The Ramus Intermediate branch is a moderate to large caliber vessel with a severe mid stenosis 5. The RCA is a large, dominant vessel with mild to moderate proximal stenosis. The mid stented segment is patent with minimal restenosis. The distal vessel has chronic severe disease just before the bifurcation into the relatively small caliber PDA and posterolateral arteries. The PDA has diffuse severe disease but is too small for PCI.  6. Successful PTCA/DES x 1 Ramus intermediate branch  Recommendations: ASA and Plavix for one month along with Xarelto. I would stop ASA at one month and continue Plavix along with Xarelto at that time.     Laboratory Data:  High Sensitivity Troponin:   Recent Labs  Lab 03/10/19 2208 03/10/19 2359  TROPONINIHS 256* 575*  Chemistry Recent Labs  Lab 03/10/19 2208  NA 138  K 4.8  CL 101  CO2 29  GLUCOSE 340*  BUN 20  CREATININE 1.49*  CALCIUM 8.9  GFRNONAA 49*  GFRAA 57*  ANIONGAP 8    No results for input(s): PROT, ALBUMIN, AST, ALT, ALKPHOS, BILITOT in the last 168 hours. Hematology Recent Labs  Lab 03/10/19 2208  WBC  12.0*  RBC 6.16*  HGB 15.7  HCT 51.0  MCV 82.8  MCH 25.5*  MCHC 30.8  RDW 19.0*  PLT 292   BNPNo results for input(s): BNP, PROBNP in the last 168 hours.  DDimer No results for input(s): DDIMER in the last 168 hours.   Radiology/Studies:  DG Chest 2 View  Result Date: 03/10/2019 CLINICAL DATA:  64 year old male with chest pain. EXAM: CHEST - 2 VIEW COMPARISON:  Chest radiograph dated 10/21/2018. FINDINGS: There is stable cardiomegaly with mild vascular congestion. No focal consolidation, pleural effusion, pneumothorax. No acute osseous pathology. IMPRESSION: Stable cardiomegaly with mild vascular congestion. Electronically Signed   By: Anner Crete M.D.   On: 03/10/2019 21:48       TIMI Risk Score for Unstable Angina or Non-ST Elevation MI:   The patient's TIMI risk score is 5, which indicates a 26% risk of all cause mortality, new or recurrent myocardial infarction or need for urgent revascularization in the next 14 days.   Assessment and Plan:  [name] is a 64 y.o. male with history of severe multivessel CAD s/p prior PCI, CKD, HF w borderline EF, HLD, substance abuse, HTN, PAD s/p external iliac stenting, DM2, and intracardiac thrombus (2020) presenting with chest pain and elevated troponin consistent with NSTEMI.   Patient's presentation consistent with Type I ACS (NSTEMI). The biggest complicating factor for this patient's case however is the fact that he does not take medications. I spoke with him about this at length. He seemed to have a somewhat disinterested approach to his health and to his medications. I explained to him how important it is that he takes medications for his heart, particularly if he goes to the cath lab for PCI. He stated "I've been read the riot act before." On review of prior records, he has self-discontinued all of his medications for weeks at a time quite frequently.   Additional discussion with the patient in the AM will be required prior to  making the decision as to whether to pursue invasive angiography.   #) NSTEMI Diagnostics: - echo in AM - check lipids, A1c  Therapeutics: - NPO for possible cath in AM - ASA 324mg  then 81mg  daily - heparin drip for ACS per pharmacy protocol - cont home pravastatin 80mg  daily (is intolerant to atorvastatin and rosuvastatin) - if patient were interested in optimizing medical care, would consider starting ezetimibe given that LDL remains >100 despite statin, may eventually even need PCSK9 inhibitor - cont home losartan 12.5mg  daily - cont home metoprolol succinate 25mg  daily - SLN, nitro gtt PRN - clopidogrel 300mg  x1 then continue home dose of 75mg  daily (which he has not taken for several weeks) - cardiac rehab - smoking cessation counseling  #) DM - hold home metformin - cont home insulin glargine 30u nightly - AC and HS insulin sliding scale - if patient were interested in optimizing medications, would consider starting SGLT2 inhibitor given benefits in patients with CAD and DM  Severity of Illness: The appropriate patient status for this patient is INPATIENT. Inpatient status is judged to be reasonable  and necessary in order to provide the required intensity of service to ensure the patient's safety. The patient's presenting symptoms, physical exam findings, and initial radiographic and laboratory data in the context of their chronic comorbidities is felt to place them at high risk for further clinical deterioration. Furthermore, it is not anticipated that the patient will be medically stable for discharge from the hospital within 2 midnights of admission. The following factors support the patient status of inpatient.   " The patient's presenting symptoms include chest pain. " The worrisome physical exam findings include obesity. " The initial radiographic and laboratory data are worrisome because of elevated troponin. " The chronic co-morbidities include CAD, DM, HTN.   * I  certify that at the point of admission it is my clinical judgment that the patient will require inpatient hospital care spanning beyond 2 midnights from the point of admission due to high intensity of service, high risk for further deterioration and high frequency of surveillance required.*    For questions or updates, please contact [place] Please consult [email or web address] for contact info under        Signed, Marcie Mowers, MD  03/11/2019 2:19 AM

## 2019-03-11 NOTE — Progress Notes (Addendum)
Progress Note  Patient Name: [name] Date of Encounter: 03/11/2019  Primary Cardiologist: [name], MD   Subjective   Recurrent CP this AM radiating to LUE; improved with SLNTG; no dyspnea  Inpatient Medications    Scheduled Meds: . aspirin EC  81 mg Oral Daily  . [START ON 03/12/2019] clopidogrel  75 mg Oral Daily  . insulin aspart  0-15 Units Subcutaneous TID WC  . insulin aspart  0-5 Units Subcutaneous QHS  . insulin glargine  30 Units Subcutaneous QHS  . losartan  12.5 mg Oral Daily  . metoprolol succinate  25 mg Oral Daily  . nicotine  14 mg Transdermal Daily  . pantoprazole  40 mg Oral Daily  . pravastatin  80 mg Oral q1800  . sodium chloride flush  3 mL Intravenous Once   Continuous Infusions: . heparin 1,200 Units/hr (03/10/19 2356)   PRN Meds: acetaminophen, nitroGLYCERIN, ondansetron (ZOFRAN) IV   Vital Signs    Vitals:   03/11/19 0045 03/11/19 0312 03/11/19 0401 03/11/19 0735  BP:  (!) 131/91 98/69 127/83  Pulse: 67 73  73  Resp: (!) 22 17  18   Temp:  (!) 97.5 F (36.4 C)  97.6 F (36.4 C)  TempSrc:  Oral  Oral  SpO2: 99% 97%  98%  Weight:  103.1 kg    Height:  5\' 9"  (1.753 m)     No intake or output data in the 24 hours ending 03/11/19 0757 Last 3 Weights 03/11/2019 03/10/2019 01/10/2019  Weight (lbs) 227 lb 4.7 oz 233 lb 235 lb  Weight (kg) 103.1 kg 105.688 kg 106.595 kg      Telemetry    Sinus with 3 beats NSVT - Personally Reviewed ECG-from with anterior lateral infarct and increased T wave inversion in the anterior lateral distribution.  Physical Exam   GEN: No acute distress.   Neck: No JVD Cardiac: RRR, no murmurs, rubs, or gallops.  Respiratory: Clear to auscultation bilaterally. GI: Soft, nontender, non-distended  MS: No edema Neuro:  Nonfocal  Psych: Normal affect   Labs    High Sensitivity Troponin:   Recent Labs  Lab 03/10/19 2208 03/10/19 2359  TROPONINIHS 256* 575*      Chemistry Recent Labs  Lab  03/10/19 2208 03/11/19 0621  NA 138 139  K 4.8 4.6  CL 101 99  CO2 29 30  GLUCOSE 340* 303*  BUN 20 16  CREATININE 1.49* 1.51*  CALCIUM 8.9 8.9  GFRNONAA 49* 48*  GFRAA 57* 56*  ANIONGAP 8 10     Hematology Recent Labs  Lab 03/10/19 2208 03/11/19 0621  WBC 12.0* 11.7*  RBC 6.16* 5.99*  HGB 15.7 15.1  HCT 51.0 48.6  MCV 82.8 81.1  MCH 25.5* 25.2*  MCHC 30.8 31.1  RDW 19.0* 18.5*  PLT 292 282    Radiology    DG Chest 2 View  Result Date: 03/10/2019 CLINICAL DATA:  64 year old male with chest pain. EXAM: CHEST - 2 VIEW COMPARISON:  Chest radiograph dated 10/21/2018. FINDINGS: There is stable cardiomegaly with mild vascular congestion. No focal consolidation, pleural effusion, pneumothorax. No acute osseous pathology. IMPRESSION: Stable cardiomegaly with mild vascular congestion. Electronically Signed   By: Anner Crete M.D.   On: 03/10/2019 21:48    Patient Profile     64 y.o. male with past medical history of coronary artery disease, noncompliance, chronic stage III kidney disease, hyperlipidemia, substance abuse, hypertension, peripheral vascular disease, diabetes mellitus, history of intracardiac thrombus admitted with  chest pain.  Assessment & Plan    1 non-ST elevation myocardial infarction-patient presents with symptoms that are concerning.  He has had recurrent chest pain this morning and electrocardiogram shows increased anterolateral T wave inversion.  Difficult situation as he has had problems with compliance in the past and was not taking his medications at time of admission.  I had a long discussion with him concerning the importance of compliance and the risks of stent thrombosis if not compliant with dual antiplatelet therapy.  He states he will take his medications in the future.  We will continue aspirin, Plavix, heparin, beta-blocker and statin.  Proceed with cardiac catheterization.  The risks and benefits including myocardial infarction, CVA, death and  worsening renal function discussed and he agrees to proceed.  We will hydrate prior to the procedure given baseline renal insufficiency.  Limit dye.  No ventriculogram.  Schedule echocardiogram to assess LV function.  2 chronic stage III kidney disease-follow renal function closely after procedure.  3 noncompliance-I discussed the importance of compliance as outlined above.  4 history of intracardiac apical thrombus-we will repeat echocardiogram and resume Xarelto if present.  5 hypertension-blood pressure controlled.  Continue present medications.  6 hyperlipidemia-continue statin.  CRITICAL CARE Performed by: Kirk Ruths   Total critical care time: 30 minutes  Critical care time was exclusive of separately billable procedures and treating other patients.  Critical care was necessary to treat or prevent imminent or life-threatening deterioration.  Critical care was time spent personally by me on the following activities: development of treatment plan with patient and/or surrogate as well as nursing, discussions with consultants, evaluation of patient's response to treatment, examination of patient, obtaining history from patient or surrogate, ordering and performing treatments and interventions, ordering and review of laboratory studies, ordering and review of radiographic studies, pulse oximetry and re-evaluation of patient's condition.    For questions or updates, please contact [place] Please consult [email or web address] for contact info under        Signed, Kirk Ruths, MD  03/11/2019, 7:57 AM

## 2019-03-11 NOTE — ED Notes (Signed)
Date and time results received: 03/11/19 0145  Test: Troponin  Critical Value: 575  Name of Provider Notified: Fransico Him, MD

## 2019-03-11 NOTE — Progress Notes (Signed)
ANTICOAGULATION CONSULT NOT  Pharmacy Consult for heparin Indication: chest pain/ACS  No Known Allergies  Patient Measurements: Height: 5\' 9"  (175.3 cm) Weight: 227 lb 4.7 oz (103.1 kg) IBW/kg (Calculated) : 70.7 HEPARIN DW (KG): 92.8   Vital Signs: Temp: 97.5 F (36.4 C) (03/01 0312) Temp Source: Oral (03/01 0312) BP: 98/69 (03/01 0401) Pulse Rate: 73 (03/01 0312)  Labs: Recent Labs    03/10/19 2208 03/11/19 0621  HGB 15.7 15.1  HCT 51.0 48.6  PLT 292 282  APTT 25  --   LABPROT 13.2  --   INR 1.0  --   HEPARINUNFRC  --  0.36  CREATININE 1.49* 1.51*   Estimated Creatinine Clearance: 58.5 mL/min (A) (by C-G formula based on SCr of 1.51 mg/dL (H)).  Medical History: Past Medical History:  Diagnosis Date  . Bulging lumbar disc   . CAD (coronary artery disease) [phone]   a. prior LAD stenting. b. s/p DES to [facility] 08/2015 @ [facility]. c. 04/2016 Cardiac cath at [facility]. # vessel CAD, with patent stent in the PLAD and RCA. Diffuse dLAD, OM2, and  RPDA disease. No aortic stenosis. Elevated LVEDP. No lesion to suggest ACS. Continue medical managment.  . Chronic lower back pain   . CKD (chronic kidney disease), stage II   . Cocaine abuse ([place])   . DM2 (diabetes mellitus, type 2) ([place])   . GERD (gastroesophageal reflux disease)   . Headache    "often; not regular" (11/30/2017)  . HTN (hypertension)   . Hyperlipidemia   . Ischemic cardiomyopathy   . MI, old   . Obesity   . Pneumonia    hx  . Sleep apnea    "have machine; I don't use it" (11/30/2017)  . Tobacco use     Medications:  Infusions:  . heparin 1,200 Units/hr (03/10/19 2356)     Assessment: Pharmacy has been asked to dose heparin for ACS/STEMI for Mr. Sixto, a 64 yo male with known CAD, heart cath in October 2020.   Goal of Therapy:  Heparin level 0.3-0.7 units/ml   Plan:  -Continue heparin 1200 units/h -Recheck heparin level daily -Cath today   Arrie Senate, PharmD, BCPS Clinical  Pharmacist [phone] Please check AMION for all [place] numbers 03/11/2019

## 2019-03-11 NOTE — Progress Notes (Signed)
Results for NAITHEN, RIVENBURG (MRN [id number]) as of 03/11/2019 13:00  Ref. Range 03/11/2019 01:51 03/11/2019 06:23 03/11/2019 11:36  Glucose-Capillary Latest Ref Range: 70 - 99 mg/dL 294 (H) 287 (H)  8 units NOVOLOG  177 (H)   Results for TOMI, PADDOCK (MRN [id number]) as of 03/11/2019 13:00  Ref. Range 10/15/2018 08:45 03/10/2019 22:08  Hemoglobin A1C Latest Ref Range: 4.8 - 5.6 % 11.3 (H) 11.7 (H)    Met w/ pt today to review home diabetes care regimen.  Pt has CBG meter but rarely checks.  Often misses Lantus and Metformin.  Told me sometimes he will forget to take his diabetes meds for a whole week and sometimes just forgets a few times a week.  Stated he hates to check his CBGs and wants to see if he can get a Freestyle Libre CGM.  Told me he missed his Lantus and Metformin for at least 7 days prior to this admission.  Spoke with patient about his current A1c of 11.7%.  Explained what an A1c is and what it measures.  Reminded patient that his goal A1c is 7% or less per ADA standards to prevent both acute and long-term complications.  Explained to patient the extreme importance of good glucose control at home.  Encouraged patient to check his CBGs at least Daily at home and to record all CBGs in a logbook for his PCP to review.  Pt stated he has an appt with the [facility] on 03/09 and plans to inquire about getting a Freestyle CGM.  Strongly encouraged pt to be more consistent with taking his meds.  Pt agreeable to using an alarm on his phone to help remind him.  Has SO in the home who helps give him his meds.    --Will follow patient during hospitalization--  Wyn Quaker RN, MSN, CDE Diabetes Coordinator Inpatient Glycemic Control Team Team Pager: [phone] (8a-5p)

## 2019-03-11 NOTE — Progress Notes (Signed)
Called by RN who stated the patient was having right-sided thigh numbness and pain after LHC today.  On my assessment right groin cath site is stable at a level 0 with no signs or symptoms of hematoma.  BP is a little soft however was previously within similar ranges.  He reports no back or flank pain.  He reports he had a similar instance with his last cardiac catheterization.  Instructed RN she may give morphine and reassess for symptoms.  He may also shift his weight off of his right hip while keeping his leg straight until bedrest duration is over. Instructed for RN to recall cardiology if symptoms do not improve.   Kathyrn Drown NP-C [place] Pager: [phone]

## 2019-03-11 NOTE — Plan of Care (Signed)

## 2019-03-11 NOTE — H&P (View-Only) (Signed)
Progress Note  Patient Name: [name] Date of Encounter: 03/11/2019  Primary Cardiologist: [name], MD   Subjective   Recurrent CP this AM radiating to LUE; improved with SLNTG; no dyspnea  Inpatient Medications    Scheduled Meds: . aspirin EC  81 mg Oral Daily  . [START ON 03/12/2019] clopidogrel  75 mg Oral Daily  . insulin aspart  0-15 Units Subcutaneous TID WC  . insulin aspart  0-5 Units Subcutaneous QHS  . insulin glargine  30 Units Subcutaneous QHS  . losartan  12.5 mg Oral Daily  . metoprolol succinate  25 mg Oral Daily  . nicotine  14 mg Transdermal Daily  . pantoprazole  40 mg Oral Daily  . pravastatin  80 mg Oral q1800  . sodium chloride flush  3 mL Intravenous Once   Continuous Infusions: . heparin 1,200 Units/hr (03/10/19 2356)   PRN Meds: acetaminophen, nitroGLYCERIN, ondansetron (ZOFRAN) IV   Vital Signs    Vitals:   03/11/19 0045 03/11/19 0312 03/11/19 0401 03/11/19 0735  BP:  (!) 131/91 98/69 127/83  Pulse: 67 73  73  Resp: (!) 22 17  18   Temp:  (!) 97.5 F (36.4 C)  97.6 F (36.4 C)  TempSrc:  Oral  Oral  SpO2: 99% 97%  98%  Weight:  103.1 kg    Height:  5\' 9"  (1.753 m)     No intake or output data in the 24 hours ending 03/11/19 0757 Last 3 Weights 03/11/2019 03/10/2019 01/10/2019  Weight (lbs) 227 lb 4.7 oz 233 lb 235 lb  Weight (kg) 103.1 kg 105.688 kg 106.595 kg      Telemetry    Sinus with 3 beats NSVT - Personally Reviewed ECG-from with anterior lateral infarct and increased T wave inversion in the anterior lateral distribution.  Physical Exam   GEN: No acute distress.   Neck: No JVD Cardiac: RRR, no murmurs, rubs, or gallops.  Respiratory: Clear to auscultation bilaterally. GI: Soft, nontender, non-distended  MS: No edema Neuro:  Nonfocal  Psych: Normal affect   Labs    High Sensitivity Troponin:   Recent Labs  Lab 03/10/19 2208 03/10/19 2359  TROPONINIHS 256* 575*      Chemistry Recent Labs  Lab  03/10/19 2208 03/11/19 0621  NA 138 139  K 4.8 4.6  CL 101 99  CO2 29 30  GLUCOSE 340* 303*  BUN 20 16  CREATININE 1.49* 1.51*  CALCIUM 8.9 8.9  GFRNONAA 49* 48*  GFRAA 57* 56*  ANIONGAP 8 10     Hematology Recent Labs  Lab 03/10/19 2208 03/11/19 0621  WBC 12.0* 11.7*  RBC 6.16* 5.99*  HGB 15.7 15.1  HCT 51.0 48.6  MCV 82.8 81.1  MCH 25.5* 25.2*  MCHC 30.8 31.1  RDW 19.0* 18.5*  PLT 292 282    Radiology    DG Chest 2 View  Result Date: 03/10/2019 CLINICAL DATA:  64 year old male with chest pain. EXAM: CHEST - 2 VIEW COMPARISON:  Chest radiograph dated 10/21/2018. FINDINGS: There is stable cardiomegaly with mild vascular congestion. No focal consolidation, pleural effusion, pneumothorax. No acute osseous pathology. IMPRESSION: Stable cardiomegaly with mild vascular congestion. Electronically Signed   By: Anner Crete M.D.   On: 03/10/2019 21:48    Patient Profile     64 y.o. male with past medical history of coronary artery disease, noncompliance, chronic stage III kidney disease, hyperlipidemia, substance abuse, hypertension, peripheral vascular disease, diabetes mellitus, history of intracardiac thrombus admitted with  chest pain.  Assessment & Plan    1 non-ST elevation myocardial infarction-patient presents with symptoms that are concerning.  He has had recurrent chest pain this morning and electrocardiogram shows increased anterolateral T wave inversion.  Difficult situation as he has had problems with compliance in the past and was not taking his medications at time of admission.  I had a long discussion with him concerning the importance of compliance and the risks of stent thrombosis if not compliant with dual antiplatelet therapy.  He states he will take his medications in the future.  We will continue aspirin, Plavix, heparin, beta-blocker and statin.  Proceed with cardiac catheterization.  The risks and benefits including myocardial infarction, CVA, death and  worsening renal function discussed and he agrees to proceed.  We will hydrate prior to the procedure given baseline renal insufficiency.  Limit dye.  No ventriculogram.  Schedule echocardiogram to assess LV function.  2 chronic stage III kidney disease-follow renal function closely after procedure.  3 noncompliance-I discussed the importance of compliance as outlined above.  4 history of intracardiac apical thrombus-we will repeat echocardiogram and resume Xarelto if present.  5 hypertension-blood pressure controlled.  Continue present medications.  6 hyperlipidemia-continue statin.  CRITICAL CARE Performed by: Kirk Ruths   Total critical care time: 30 minutes  Critical care time was exclusive of separately billable procedures and treating other patients.  Critical care was necessary to treat or prevent imminent or life-threatening deterioration.  Critical care was time spent personally by me on the following activities: development of treatment plan with patient and/or surrogate as well as nursing, discussions with consultants, evaluation of patient's response to treatment, examination of patient, obtaining history from patient or surrogate, ordering and performing treatments and interventions, ordering and review of laboratory studies, ordering and review of radiographic studies, pulse oximetry and re-evaluation of patient's condition.    For questions or updates, please contact [place] Please consult [email or web address] for contact info under        Signed, Kirk Ruths, MD  03/11/2019, 7:57 AM

## 2019-03-11 NOTE — Progress Notes (Signed)
Echocardiogram 2D Echocardiogram has been performed.  [name] 03/11/2019, 10:49 AM

## 2019-03-11 NOTE — Progress Notes (Signed)
Site area: right groin  Site Prior to Removal:  Level 0  Pressure Applied For 20 MINUTES    Minutes Beginning at 1655  Manual:   Yes.    Patient Status During Pull:  Stable  Post Pull Groin Site:  Level 0  Post Pull Instructions Given:  Yes.    Post Pull Pulses Present:  Yes.    Dressing Applied:  Yes.    Comments:  Bed rest started at 1715 X 4 hr.

## 2019-03-11 NOTE — Interval H&P Note (Signed)
Cath Lab Visit (complete for each Cath Lab visit)  Clinical Evaluation Leading to the Procedure:   ACS: Yes.    Non-ACS:    Anginal Classification: CCS IV  Anti-ischemic medical therapy: Minimal Therapy (1 class of medications)  Non-Invasive Test Results: No non-invasive testing performed  Prior CABG: No previous CABG      History and Physical Interval Note:  03/11/2019 1:55 PM  [name]  has presented today for surgery, with the diagnosis of st elevation.  The various methods of treatment have been discussed with the patient and family. After consideration of risks, benefits and other options for treatment, the patient has consented to  Procedure(s): LEFT HEART CATH AND CORONARY ANGIOGRAPHY (N/A) as a surgical intervention.  The patient's history has been reviewed, patient examined, no change in status, stable for surgery.  I have reviewed the patient's chart and labs.  Questions were answered to the patient's satisfaction.     [name]

## 2019-03-12 ENCOUNTER — Other Ambulatory Visit: Payer: Self-pay | Admitting: Physician Assistant

## 2019-03-12 ENCOUNTER — Telehealth: Payer: Self-pay | Admitting: Cardiovascular Disease

## 2019-03-12 DIAGNOSIS — N1831 Chronic kidney disease, stage 3a: Secondary | ICD-10-CM

## 2019-03-12 LAB — BASIC METABOLIC PANEL
Anion gap: 10 (ref 5–15)
BUN: 21 mg/dL (ref 8–23)
CO2: 24 mmol/L (ref 22–32)
Calcium: 8.4 mg/dL — ABNORMAL LOW (ref 8.9–10.3)
Chloride: 102 mmol/L (ref 98–111)
Creatinine, Ser: 1.68 mg/dL — ABNORMAL HIGH (ref 0.61–1.24)
GFR calc Af Amer: 49 mL/min — ABNORMAL LOW (ref >60)
GFR calc non Af Amer: 42 mL/min — ABNORMAL LOW (ref >60)
Glucose, Bld: 286 mg/dL — ABNORMAL HIGH (ref 70–99)
Potassium: 4.1 mmol/L (ref 3.5–5.1)
Sodium: 136 mmol/L (ref 135–145)

## 2019-03-12 LAB — CBC
HCT: 46.3 % (ref 39.0–52.0)
Hemoglobin: 14.8 g/dL (ref 13.0–17.0)
MCH: 25.6 pg — ABNORMAL LOW (ref 26.0–34.0)
MCHC: 32 g/dL (ref 30.0–36.0)
MCV: 80.1 fL (ref 80.0–100.0)
Platelets: 246 10*3/uL (ref 150–400)
RBC: 5.78 MIL/uL (ref 4.22–5.81)
RDW: 18.5 % — ABNORMAL HIGH (ref 11.5–15.5)
WBC: 11.1 10*3/uL — ABNORMAL HIGH (ref 4.0–10.5)
nRBC: 0 % (ref 0.0–0.2)

## 2019-03-12 LAB — GLUCOSE, CAPILLARY
Glucose-Capillary: 123 mg/dL — ABNORMAL HIGH (ref 70–99)
Glucose-Capillary: 290 mg/dL — ABNORMAL HIGH (ref 70–99)
Glucose-Capillary: 197 mg/dL — ABNORMAL HIGH (ref 70–99)

## 2019-03-12 NOTE — Progress Notes (Signed)
Inpatient Diabetes Program Recommendations  AACE/ADA: New Consensus Statement on Inpatient Glycemic Control (2015)  Target Ranges:  Prepandial:   less than 140 mg/dL      Peak postprandial:   less than 180 mg/dL (1-2 hours)      Critically ill patients:  140 - 180 mg/dL   Lab Results  Component Value Date   GLUCAP 197 (H) 03/12/2019   HGBA1C 11.7 (H) 03/10/2019    Review of Glycemic Control Results for BRONCO, STEM (MRN [id number]) as of 03/12/2019 11:51  Ref. Range 03/11/2019 20:32 03/11/2019 21:25 03/12/2019 06:35 03/12/2019 11:24  Glucose-Capillary Latest Ref Range: 70 - 99 mg/dL 220 (H) 280 (H) 290 (H) 197 (H)   Diabetes history: Type 2 DM Outpatient Diabetes medications: Lantus 30 units QHS, Metformin 1000 mg BID (taking inconsistently) Current orders for Inpatient glycemic control: Lantus 30 units QHS, Novolog 0-15 units TID, Novolog 0-5 units QHS  Inpatient Diabetes Program Recommendations:    Noted FSBG was 290 mg/dL and meal was documented immediately after. Reached out to RN to verify if FSBG was accurate measure prior to eating.   If trends continue to exceed 180 mg/dL, consider increasing Lantus to 36 units QHS.   Thanks, Bronson Curb, MSN, RNC-OB Diabetes Coordinator [phone] (8a-5p)

## 2019-03-12 NOTE — Telephone Encounter (Signed)
Patient contacted regarding discharge from [facility] on 03/12/19.  Patient understands to follow up with provider Bernerd Pho, PA-C on 03/28/19 at 2:30 at [place]. Patient understands discharge instructions? yes Patient understands medications and regiment? Yes  Patient understands to bring all medications to this visit? Yes   Ask patient:  Are you enrolled in My Chart (yes or no)  If no ask patient if they would like to enroll.    Postop Surgical Patients:                What is your wound status? Any signs/ symptoms of infection (Temp, redness/ red streaks, swelling, purulent drainage, foul odor or smell)? .             Please do not place any creams/ lotions/ or antibiotic ointment on any surgical incisions/ wounds without physician approval. .             Do you have any questions about your medications?  All medications (except pain medications) are to be filled by your Cardiologist AFTER your first post op       appointment with them.  Are you taking your pain medication? .             How is your pain controlled? Pain level? .             If you require a refill on pain medications, know that the same medication/ amount may not be prescribed or a refill may not be given.  Please contact your pharmacy for refill requests.  .             Do you have help at home with ADL's?  If you have home health, have you been contacted or seen by the agency? .             Please refer to your Pre/post surgery booklet, there is a lot of useful information in it that may answer any questions you may have. .             Please note that it is ok to remove your surgical dressing, shower (soap/ water), and pat the incision dry.  For surgery related questions staff will route a phone note to CV DIV TCTS [facility] pool  [facility] [place], [place] [address]  For nonsurgery patients please delete the surgery questions.  For patients

## 2019-03-12 NOTE — Progress Notes (Signed)
Progress Note  Patient Name: [name] Date of Encounter: 03/12/2019  Primary Cardiologist: [name], MD   Subjective   Denies CP or dyspnea; complains of left wrist and left shoulder pain.  Inpatient Medications    Scheduled Meds:  aspirin EC  81 mg Oral Daily   clopidogrel  75 mg Oral Q breakfast   insulin aspart  0-15 Units Subcutaneous TID WC   insulin aspart  0-5 Units Subcutaneous QHS   insulin glargine  30 Units Subcutaneous QHS   losartan  12.5 mg Oral Daily   metoprolol succinate  25 mg Oral Daily   nicotine  14 mg Transdermal Daily   pantoprazole  40 mg Oral Daily   pravastatin  80 mg Oral q1800   sodium chloride flush  3 mL Intravenous Once   sodium chloride flush  3 mL Intravenous Q12H   sodium chloride flush  3 mL Intravenous Q12H   Continuous Infusions:  sodium chloride     PRN Meds: sodium chloride, acetaminophen, morphine, nitroGLYCERIN, ondansetron (ZOFRAN) IV, sodium chloride flush   Vital Signs    Vitals:   03/12/19 0001 03/12/19 0347 03/12/19 0600 03/12/19 0719  BP:  98/73  102/74  Pulse:    70  Resp: 18 (!) 25 19 19   Temp:  (!) 97.4 F (36.3 C)  97.7 F (36.5 C)  TempSrc:  Oral  Oral  SpO2:    92%  Weight:      Height:        Intake/Output Summary (Last 24 hours) at 03/12/2019 0812 Last data filed at 03/12/2019 0719 Gross per 24 hour  Intake 1133.14 ml  Output --  Net 1133.14 ml   Last 3 Weights 03/11/2019 03/10/2019 01/10/2019  Weight (lbs) 227 lb 4.7 oz 233 lb 235 lb  Weight (kg) 103.1 kg 105.688 kg 106.595 kg      Telemetry    Sinus- Personally Reviewed  Physical Exam   GEN: No acute distress.  WD WN Neck: No JVD, supple Cardiac: RRR Respiratory: CTA GI: Soft, NT/ND MS: No edema; femoral cath site with no hematoma no bruit. Neuro:  Grossly intact Psych: Normal affect   Labs    High Sensitivity Troponin:   Recent Labs  Lab 03/10/19 2208 03/10/19 2359  TROPONINIHS 256* 575*       Chemistry Recent Labs  Lab 03/10/19 2208 03/11/19 0621 03/12/19 0135  NA 138 139 136  K 4.8 4.6 4.1  CL 101 99 102  CO2 29 30 24   GLUCOSE 340* 303* 286*  BUN 20 16 21   CREATININE 1.49* 1.51* 1.68*  CALCIUM 8.9 8.9 8.4*  GFRNONAA 49* 48* 42*  GFRAA 57* 56* 49*  ANIONGAP 8 10 10      Hematology Recent Labs  Lab 03/10/19 2208 03/11/19 0621 03/12/19 0135  WBC 12.0* 11.7* 11.1*  RBC 6.16* 5.99* 5.78  HGB 15.7 15.1 14.8  HCT 51.0 48.6 46.3  MCV 82.8 81.1 80.1  MCH 25.5* 25.2* 25.6*  MCHC 30.8 31.1 32.0  RDW 19.0* 18.5* 18.5*  PLT 292 282 246    Radiology    DG Chest 2 View  Result Date: 03/10/2019 CLINICAL DATA:  64 year old male with chest pain. EXAM: CHEST - 2 VIEW COMPARISON:  Chest radiograph dated 10/21/2018. FINDINGS: There is stable cardiomegaly with mild vascular congestion. No focal consolidation, pleural effusion, pneumothorax. No acute osseous pathology. IMPRESSION: Stable cardiomegaly with mild vascular congestion. Electronically Signed   By: Anner Crete M.D.   On: 03/10/2019 21:48  CARDIAC CATHETERIZATION  Result Date: 03/11/2019  Colon Flattery 2nd Diag lesion is 80% stenosed.  Prox LAD to Mid LAD lesion is 10% stenosed.  Prox LAD lesion is 40% stenosed.  Dist LAD-1 lesion is 75% stenosed.  Dist LAD-2 lesion is 99% stenosed.  Prior LAD stent is patent.  Previously placed Ramus drug eluting stent is widely patent.  1st Mrg-2 lesion is 90% stenosed.  1st Mrg-1 lesion is 99% stenosed.  Mid Cx to Dist Cx lesion is 90% stenosed. Diffuse small vessel disease.  Dist RCA lesion is 90% stenosed. RPDA lesion is 90% stenosed.  A drug-eluting stent was successfully placed using a SYNERGY XD 2.50X28 postdilated to 3.25 mm ditally in teh PDA A drug-eluting stent was successfully placed using a SYNERGY XD 3.0X28., intot the ostial PDA and in the distal RCA.  The stents overlap. Post intervention, there is a 0% residual stenosis.  Prox RCA lesion is 40% stenosed.  Mid  RCA lesion is 10% stenosed.  LV end diastolic pressure is moderately elevated.  There is no aortic valve stenosis.  RPDA lesion is 90% stenosed.  Post intervention, there is a 0% residual stenosis.  A drug-eluting stent was successfully placed using a SYNERGY XD 3.0X28.  Diffuse distal LAD disease, not amenable to PCI.  Diffuse circumflex, OM disease- unchanged from prior. Successful PCI of the PDA and the distal RCA.  Stressed compliance with medicines.  Discussed importance of taking meds with his daughter, [name].   ECHOCARDIOGRAM COMPLETE  Result Date: 03/11/2019    ECHOCARDIOGRAM REPORT   Patient Name:   [name] Date of Exam: 03/11/2019 Medical Rec #:  [id number]        Height:       69.0 in Accession #:    [id number]       Weight:       227.3 lb Date of Birth:  19-Jun-1955        BSA:          2.181 m Patient Age:    64 years         BP:           127/83 mmHg Patient Gender: M                HR:           76 bpm. Exam Location:  Inpatient Procedure: 2D Echo, Color Doppler, Cardiac Doppler and Intracardiac            Opacification Agent Indications:    Acute Coronary Syndrome i24.9  History:        Patient has prior history of Echocardiogram examinations, most                 recent 10/16/2018. CHF, NSTEMI; Risk Factors:Hypertension,                 Diabetes and Dyslipidemia.  Sonographer:    Raquel Sarna Senior RDCS Referring Phys: [place]  1. There is apical akinesis. Definity contrast reveals an apical filling defect that is consistent with thrombus / early thrombus formation . Marland Kitchen Left ventricular ejection fraction, by estimation, is 30 to 35%. The left ventricle has moderately decreased function. The left ventricle demonstrates regional wall motion abnormalities (see scoring diagram/findings for description). Left ventricular diastolic parameters are consistent with Grade I diastolic dysfunction (impaired relaxation). There is severe akinesis of the left ventricular, mid-apical  anteroseptal wall, lateral wall and inferior wall.  2. Right ventricular systolic function is  normal. The right ventricular size is normal.  3. The mitral valve is normal in structure and function. No evidence of mitral valve regurgitation. No evidence of mitral stenosis.  4. The aortic valve is grossly normal. Aortic valve regurgitation is not visualized. No aortic stenosis is present. FINDINGS  Left Ventricle: There is apical akinesis. Definity contrast reveals an apical filling defect that is consistent with thrombus / early thrombus formation. Left ventricular ejection fraction, by estimation, is 30 to 35%. The left ventricle has moderately decreased function. The left ventricle demonstrates regional wall motion abnormalities. Severe akinesis of the left ventricular, mid-apical anteroseptal wall, lateral wall and inferior wall. The left ventricular internal cavity size was normal in size. There is no left ventricular hypertrophy. Left ventricular diastolic parameters are consistent with Grade I diastolic dysfunction (impaired relaxation). Right Ventricle: The right ventricular size is normal. No increase in right ventricular wall thickness. Right ventricular systolic function is normal. Left Atrium: Left atrial size was normal in size. Right Atrium: Right atrial size was normal in size. Pericardium: There is no evidence of pericardial effusion. Mitral Valve: The mitral valve is normal in structure and function. No evidence of mitral valve regurgitation. No evidence of mitral valve stenosis. Tricuspid Valve: The tricuspid valve is grossly normal. Tricuspid valve regurgitation is trivial. Aortic Valve: The aortic valve is grossly normal. Aortic valve regurgitation is not visualized. No aortic stenosis is present. Pulmonic Valve: The pulmonic valve was normal in structure. Pulmonic valve regurgitation is not visualized. Aorta: The aortic root and ascending aorta are structurally normal, with no evidence of  dilitation. IAS/Shunts: The atrial septum is grossly normal.  LEFT VENTRICLE PLAX 2D LVIDd:         5.20 cm      Diastology LVIDs:         4.20 cm      LV e' lateral:   4.03 cm/s LV PW:         0.80 cm      LV E/e' lateral: 10.1 LV IVS:        1.10 cm      LV e' medial:    3.48 cm/s LVOT diam:     2.00 cm      LV E/e' medial:  11.7 LV SV:         36 LV SV Index:   17 LVOT Area:     3.14 cm  LV Volumes (MOD) LV vol d, MOD A2C: 106.0 ml LV vol d, MOD A4C: 89.8 ml LV vol s, MOD A2C: 70.9 ml LV vol s, MOD A4C: 61.0 ml LV SV MOD A2C:     35.1 ml LV SV MOD A4C:     89.8 ml LV SV MOD BP:      32.2 ml RIGHT VENTRICLE RV S prime:     10.30 cm/s TAPSE (M-mode): 2.0 cm LEFT ATRIUM             Index       RIGHT ATRIUM           Index LA diam:        3.90 cm 1.79 cm/m  RA Area:     12.70 cm LA Vol (A2C):   39.3 ml 18.02 ml/m RA Volume:   27.60 ml  12.65 ml/m LA Vol (A4C):   33.2 ml 15.22 ml/m LA Biplane Vol: 36.1 ml 16.55 ml/m  AORTIC VALVE LVOT Vmax:   68.70 cm/s LVOT Vmean:  46.100 cm/s LVOT VTI:  0.116 m  AORTA Ao Root diam: 3.20 cm MITRAL VALVE MV Area (PHT): 2.60 cm    SHUNTS MV Decel Time: 292 msec    Systemic VTI:  0.12 m MV E velocity: 40.70 cm/s  Systemic Diam: 2.00 cm MV A velocity: 57.20 cm/s MV E/A ratio:  0.71 Mertie Moores MD Electronically signed by Mertie Moores MD Signature Date/Time: 03/11/2019/2:38:20 PM    Final     Patient Profile     64 y.o. male with past medical history of coronary artery disease, noncompliance, chronic stage III kidney disease, hyperlipidemia, substance abuse, hypertension, peripheral vascular disease, diabetes mellitus, history of intracardiac thrombus admitted with chest pain.  Assessment & Plan    1 non-ST elevation myocardial infarction-patient is now status post PCI of RCA/PDA.  There is no recurrent chest pain.  Continue aspirin, Plavix, beta-blocker and statin.  I discussed at length the importance of compliance with dual antiplatelet therapy and the risk of stent  thrombosis including myocardial infarction and death.    2 chronic stage III kidney disease-renal function unchanged today.  Would repeat bmet in 48 hours.  3 noncompliance-I discussed the importance of compliance as outlined above.  4 history of intracardiac apical thrombus-I have personally reviewed the patient's transthoracic echocardiogram.  I do not see definite thrombus though there is clearly a swirling/low flow at the apex and risk of thrombus is high (and apparently has had apical thrombus in the past).  We will plan aspirin and Plavix for 30 days.  We will then discontinue aspirin and add Xarelto and continue Plavix long-term.  I do not think he would be a good Coumadin candidate given his history of intermittent compliance.  5 hypertension-blood pressure controlled.  Continue present medications.  6 hyperlipidemia-continue statin.  7 ischemic cardiomyopathy-continue ARB and beta-blocker.  Titrate medications following discharge.  He would not be a candidate for ICD unless he can demonstrate compliance with medications and follow-up in the future.  Discharge today and follow-up with APP in [place] in 1 to 2 weeks for transition of care appointment.  Follow-up Dr. [name] in 3 months. Greater than 30 minutes PA and physician time. D2   For questions or updates, please contact [place] Please consult [email or web address] for contact info under        Signed, Kirk Ruths, MD  03/12/2019, 8:12 AM

## 2019-03-12 NOTE — Discharge Summary (Signed)
Discharge Summary    Patient ID: [name] MRN: [id number]; DOB: [birth year]  Admit date: 03/10/2019 Discharge date: 03/12/2019  Primary Care Provider: System, [place] Not In  Primary Cardiologist: [name], MD   Discharge Diagnoses    Active Problems:   NSTEMI (non-ST elevated myocardial infarction) [facility])  CAD  Non compliance  ICM  Tobacco smoking  Apical thrombus  HTN   HLD Uncontrolled DM  Diagnostic Studies/Procedures    Echo 03/11/19 IMPRESSIONS  1. There is apical akinesis. Definity contrast reveals an apical filling  defect that is consistent with thrombus / early thrombus formation . Marland Kitchen  Left ventricular ejection fraction, by estimation, is 30 to 35%. The left  ventricle has moderately decreased  function. The left ventricle demonstrates regional wall motion  abnormalities (see scoring diagram/findings for description). Left  ventricular diastolic parameters are consistent with Grade I diastolic  dysfunction (impaired relaxation). There is severe  akinesis of the left ventricular, mid-apical anteroseptal wall, lateral  wall and inferior wall.  2. Right ventricular systolic function is normal. The right ventricular  size is normal.  3. The mitral valve is normal in structure and function. No evidence of  mitral valve regurgitation. No evidence of mitral stenosis.  4. The aortic valve is grossly normal. Aortic valve regurgitation is not  visualized. No aortic stenosis is present  CORONARY STENT INTERVENTION  03/11/19  LEFT HEART CATH AND CORONARY ANGIOGRAPHY  Conclusion    Ost 2nd Diag lesion is 80% stenosed.  Prox LAD to Mid LAD lesion is 10% stenosed.  Prox LAD lesion is 40% stenosed.  Dist LAD-1 lesion is 75% stenosed.  Dist LAD-2 lesion is 99% stenosed.  Prior LAD stent is patent.  Previously placed Ramus drug eluting stent is widely patent.  1st Mrg-2 lesion is 90% stenosed.  1st Mrg-1 lesion is 99% stenosed.  Mid Cx to Dist Cx  lesion is 90% stenosed. Diffuse small vessel disease.  Dist RCA lesion is 90% stenosed. RPDA lesion is 90% stenosed.  A drug-eluting stent was successfully placed using a SYNERGY XD 2.50X28 postdilated to 3.25 mm ditally in teh PDA A drug-eluting stent was successfully placed using a SYNERGY XD 3.0X28., intot the ostial PDA and in the distal RCA.  The stents overlap. Post intervention, there is a 0% residual stenosis.  Prox RCA lesion is 40% stenosed.  Mid RCA lesion is 10% stenosed.  LV end diastolic pressure is moderately elevated.  There is no aortic valve stenosis.  RPDA lesion is 90% stenosed.  Post intervention, there is a 0% residual stenosis.  A drug-eluting stent was successfully placed using a SYNERGY XD 3.0X28.   Diffuse distal LAD disease, not amenable to PCI.  Diffuse circumflex, OM disease- unchanged from prior.  Successful PCI of the PDA and the distal RCA.    Stressed compliance with medicines.  Discussed importance of taking meds with his daughter, [name].   Diagnostic Dominance: Right  Intervention       History of Present Illness     [name] is a 64 y.o. male with history of severe multivessel CAD s/p prior PCI, CKD, HF w borderline EF, HLD, substance abuse, HTN, PAD s/p external iliac stenting, DM2, and intracardiac thrombus (2020) presented with CP.   Hx of CAD s/p prior LAD stenting, DES to RCA in 08/2015, patent stent by repeat cath in 04/2016 with diffuse nonobstructive CAD along distal-LAD, OM2 and PDA. LHC  01/2018 showing 80% mid LCx, 80% mid-LAD, 80% OM1, 50% Prox-RCA  and 80% distal RCA and not felt to be candidate for CABG and PCI was not pursued, s/p DESx1 to RI in 10/2018.  Mr. Luebke reported chest pain starting early in the morning of 2/28 shortly after eating. He called EMS evening of 03/10/19 and when EMS arrived he was found unconscious. He had a pulse and did not fall or experience any trauma. EMS suspected a vasovagal  episode. He was brought to the [facility] ED. At [facility], the patient's ECG showed diffuse Q waves but no ST elevation. His initial troponin was 256 and follow up was 575. He was given aspirin and started on a heparin drip. He also received fluids and SL nitro for continued chest pain. He was transferred to [facility] for further management.   On arrival to [facility], he was chest pain free and asymptomatic.   Hospital Course     Consultants: None  1. Non-ST elevation myocardial infarction - Hs-Troponin 575 peaked at . Cath as above  status post PCI of RCA/PDA.  No recurrent chest pain.  Continue aspirin, Plavix, beta-blocker and statin. Discussed importance of compliance with dual antiplatelet therapy and the risk of stent thrombosis including myocardial infarction and death.    2 Chronic stage III kidney disease - SCr 1.49>>1.51>>1.68 - Plan to repeat BMET Friday  3.  History of intracardiac apical thrombus - Echo read as apical filling  defect that is consistent with thrombus / early thrombus formation. Dr. Stanford Breed has personally reviewed the patient's transthoracic echocardiogram.  "I do not see definite thrombus though there is clearly a swirling/low flow at the apex and risk of thrombus is high (and apparently has had apical thrombus in the past).  We will plan aspirin and Plavix for 30 days.  We will then discontinue aspirin and add Xarelto and continue Plavix long-term.  I do not think he would be a good Coumadin candidate given his history of intermittent compliance".  4. ischemic cardiomyopathy - LVEF of 30-35% (40% EF by echo 10/2018), grade 1 DD. Euvolemic.  - Continue ARB and beta-blocker.  -  Titrate medications as outpatient.  -  He would not be a candidate for ICD unless he can demonstrate compliance with medications and follow-up in the future.  5. HTN - BP stable  6. HLD - 10/16/2018: Cholesterol 161; HDL 24; LDL Cholesterol 114; Triglycerides 117; VLDL 23  -  Continued home statin - Consider change to high intensity statin as outpatient  7. L shoulder and wrist pain - Advised to follow up with PCP  8. Tobacco abuse - Unable to quit due to others in household smoke   9. Uncontrolled DM - A1C > 10. Recommended compliance. Consider SPGL2 inhibitor if shows compliance.   Did the patient have an acute coronary syndrome (MI, NSTEMI, STEMI, etc) this admission?:  Yes                               AHA/ACC Clinical Performance & Quality Measures: 1. Aspirin prescribed? - Yes 2. ADP Receptor Inhibitor (Plavix/Clopidogrel, Brilinta/Ticagrelor or Effient/Prasugrel) prescribed (includes medically managed patients)? - Yes 3. Beta Blocker prescribed? - Yes 4. High Intensity Statin (Lipitor 40-80mg  or Crestor 20-40mg ) prescribed? - No - Plan to change as outpatient  5. EF assessed during THIS hospitalization? - Yes 6. For EF <40%, was ACEI/ARB prescribed? - Yes 7. For EF <40%, Aldosterone Antagonist (Spironolactone or Eplerenone) prescribed? - No - Reason:  consider as outpatient  8. Cardiac Rehab Phase II ordered (Included Medically managed Patients)? - No - patient declined    _____________  Discharge Vitals Blood pressure (!) 112/96, pulse 68, temperature 97.6 F (36.4 C), temperature source Oral, resp. rate 20, height 5\' 9"  (1.753 m), weight 103.1 kg, SpO2 98 %.  Filed Weights   03/10/19 2101 03/11/19 0312  Weight: 105.7 kg 103.1 kg    Labs & Radiologic Studies    CBC Recent Labs    03/11/19 0621 03/12/19 0135  WBC 11.7* 11.1*  HGB 15.1 14.8  HCT 48.6 46.3  MCV 81.1 80.1  PLT 282 0000000   Basic Metabolic Panel Recent Labs    03/11/19 0621 03/12/19 0135  NA 139 136  K 4.6 4.1  CL 99 102  CO2 30 24  GLUCOSE 303* 286*  BUN 16 21  CREATININE 1.51* 1.68*  CALCIUM 8.9 8.4*    Recent Labs    03/10/19 2208  LIPASE 40   High Sensitivity Troponin:   Recent Labs  Lab 03/10/19 2208 03/10/19 2359  TROPONINIHS 256* 575*     Hemoglobin A1C Recent Labs    03/10/19 2208  HGBA1C 11.7*   _____________  DG Chest 2 View  Result Date: 03/10/2019 CLINICAL DATA:  64 year old male with chest pain. EXAM: CHEST - 2 VIEW COMPARISON:  Chest radiograph dated 10/21/2018. FINDINGS: There is stable cardiomegaly with mild vascular congestion. No focal consolidation, pleural effusion, pneumothorax. No acute osseous pathology. IMPRESSION: Stable cardiomegaly with mild vascular congestion. Electronically Signed   By: Anner Crete M.D.   On: 03/10/2019 21:48   CARDIAC CATHETERIZATION  Result Date: 03/11/2019  Colon Flattery 2nd Diag lesion is 80% stenosed.  Prox LAD to Mid LAD lesion is 10% stenosed.  Prox LAD lesion is 40% stenosed.  Dist LAD-1 lesion is 75% stenosed.  Dist LAD-2 lesion is 99% stenosed.  Prior LAD stent is patent.  Previously placed Ramus drug eluting stent is widely patent.  1st Mrg-2 lesion is 90% stenosed.  1st Mrg-1 lesion is 99% stenosed.  Mid Cx to Dist Cx lesion is 90% stenosed. Diffuse small vessel disease.  Dist RCA lesion is 90% stenosed. RPDA lesion is 90% stenosed.  A drug-eluting stent was successfully placed using a SYNERGY XD 2.50X28 postdilated to 3.25 mm ditally in teh PDA A drug-eluting stent was successfully placed using a SYNERGY XD 3.0X28., intot the ostial PDA and in the distal RCA.  The stents overlap. Post intervention, there is a 0% residual stenosis.  Prox RCA lesion is 40% stenosed.  Mid RCA lesion is 10% stenosed.  LV end diastolic pressure is moderately elevated.  There is no aortic valve stenosis.  RPDA lesion is 90% stenosed.  Post intervention, there is a 0% residual stenosis.  A drug-eluting stent was successfully placed using a SYNERGY XD 3.0X28.  Diffuse distal LAD disease, not amenable to PCI.  Diffuse circumflex, OM disease- unchanged from prior. Successful PCI of the PDA and the distal RCA.  Stressed compliance with medicines.  Discussed importance of taking meds with his  daughter, [name].   ECHOCARDIOGRAM COMPLETE  Result Date: 03/11/2019    ECHOCARDIOGRAM REPORT   Patient Name:   KYNDELL HEWARD Date of Exam: 03/11/2019 Medical Rec #:  [id number]        Height:       69.0 in Accession #:    [id number]       Weight:       227.3 lb Date of Birth:  16-Sep-1955  BSA:          2.181 m Patient Age:    59 years         BP:           127/83 mmHg Patient Gender: M                HR:           76 bpm. Exam Location:  Inpatient Procedure: 2D Echo, Color Doppler, Cardiac Doppler and Intracardiac            Opacification Agent Indications:    Acute Coronary Syndrome i24.9  History:        Patient has prior history of Echocardiogram examinations, most                 recent 10/16/2018. CHF, NSTEMI; Risk Factors:Hypertension,                 Diabetes and Dyslipidemia.  Sonographer:    Raquel Sarna Senior RDCS Referring Phys: [place]  1. There is apical akinesis. Definity contrast reveals an apical filling defect that is consistent with thrombus / early thrombus formation . Marland Kitchen Left ventricular ejection fraction, by estimation, is 30 to 35%. The left ventricle has moderately decreased function. The left ventricle demonstrates regional wall motion abnormalities (see scoring diagram/findings for description). Left ventricular diastolic parameters are consistent with Grade I diastolic dysfunction (impaired relaxation). There is severe akinesis of the left ventricular, mid-apical anteroseptal wall, lateral wall and inferior wall.  2. Right ventricular systolic function is normal. The right ventricular size is normal.  3. The mitral valve is normal in structure and function. No evidence of mitral valve regurgitation. No evidence of mitral stenosis.  4. The aortic valve is grossly normal. Aortic valve regurgitation is not visualized. No aortic stenosis is present. FINDINGS  Left Ventricle: There is apical akinesis. Definity contrast reveals an apical filling defect that is consistent  with thrombus / early thrombus formation. Left ventricular ejection fraction, by estimation, is 30 to 35%. The left ventricle has moderately decreased function. The left ventricle demonstrates regional wall motion abnormalities. Severe akinesis of the left ventricular, mid-apical anteroseptal wall, lateral wall and inferior wall. The left ventricular internal cavity size was normal in size. There is no left ventricular hypertrophy. Left ventricular diastolic parameters are consistent with Grade I diastolic dysfunction (impaired relaxation). Right Ventricle: The right ventricular size is normal. No increase in right ventricular wall thickness. Right ventricular systolic function is normal. Left Atrium: Left atrial size was normal in size. Right Atrium: Right atrial size was normal in size. Pericardium: There is no evidence of pericardial effusion. Mitral Valve: The mitral valve is normal in structure and function. No evidence of mitral valve regurgitation. No evidence of mitral valve stenosis. Tricuspid Valve: The tricuspid valve is grossly normal. Tricuspid valve regurgitation is trivial. Aortic Valve: The aortic valve is grossly normal. Aortic valve regurgitation is not visualized. No aortic stenosis is present. Pulmonic Valve: The pulmonic valve was normal in structure. Pulmonic valve regurgitation is not visualized. Aorta: The aortic root and ascending aorta are structurally normal, with no evidence of dilitation. IAS/Shunts: The atrial septum is grossly normal.  LEFT VENTRICLE PLAX 2D LVIDd:         5.20 cm      Diastology LVIDs:         4.20 cm      LV e' lateral:   4.03 cm/s LV PW:  0.80 cm      LV E/e' lateral: 10.1 LV IVS:        1.10 cm      LV e' medial:    3.48 cm/s LVOT diam:     2.00 cm      LV E/e' medial:  11.7 LV SV:         36 LV SV Index:   17 LVOT Area:     3.14 cm  LV Volumes (MOD) LV vol d, MOD A2C: 106.0 ml LV vol d, MOD A4C: 89.8 ml LV vol s, MOD A2C: 70.9 ml LV vol s, MOD A4C: 61.0 ml  LV SV MOD A2C:     35.1 ml LV SV MOD A4C:     89.8 ml LV SV MOD BP:      32.2 ml RIGHT VENTRICLE RV S prime:     10.30 cm/s TAPSE (M-mode): 2.0 cm LEFT ATRIUM             Index       RIGHT ATRIUM           Index LA diam:        3.90 cm 1.79 cm/m  RA Area:     12.70 cm LA Vol (A2C):   39.3 ml 18.02 ml/m RA Volume:   27.60 ml  12.65 ml/m LA Vol (A4C):   33.2 ml 15.22 ml/m LA Biplane Vol: 36.1 ml 16.55 ml/m  AORTIC VALVE LVOT Vmax:   68.70 cm/s LVOT Vmean:  46.100 cm/s LVOT VTI:    0.116 m  AORTA Ao Root diam: 3.20 cm MITRAL VALVE MV Area (PHT): 2.60 cm    SHUNTS MV Decel Time: 292 msec    Systemic VTI:  0.12 m MV E velocity: 40.70 cm/s  Systemic Diam: 2.00 cm MV A velocity: 57.20 cm/s MV E/A ratio:  0.71 Mertie Moores MD Electronically signed by Mertie Moores MD Signature Date/Time: 03/11/2019/2:38:20 PM    Final    Disposition   Pt is being discharged home today in good condition.  Follow-up Plans & Appointments    Follow-up Information    Erma Heritage, [place]. Go on 03/28/2019.   Specialties: Physician Assistant, Cardiology Why: @2 :30pm for hospital follow up Contact information: [place] [address] [place]. Go on 03/15/2019.   Specialty: Cardiology Why: for kidney function check  Contact information: [address] [place]         Discharge Instructions    Amb Referral to Cardiac Rehabilitation   Complete by: As directed    Diagnosis:  NSTEMI Coronary Stents     After initial evaluation and assessments completed: Virtual Based Care may be provided alone or in conjunction with Phase 2 Cardiac Rehab based on patient barriers.: Yes   Diet - low sodium heart healthy   Complete by: As directed    Discharge instructions   Complete by: As directed    No driving for 48 hours. No lifting over 5 lbs for 1 week. No sexual activity for 1 week.  Keep procedure site clean & dry. If you notice  increased pain, swelling, bleeding or pus, call/return!  You may shower, but no soaking baths/hot tubs/pools for 1 week.   Hold metformin today and resume tomorrow.   Increase activity slowly   Complete by: As directed       Discharge Medications   Allergies as of 03/12/2019  No Known Allergies     Medication List    TAKE these medications   aspirin EC 81 MG tablet Take 1 tablet (81 mg total) by mouth daily.   clopidogrel 75 MG tablet Commonly known as: PLAVIX Take 1 tablet (75 mg total) by mouth daily with breakfast.   insulin glargine 100 UNIT/ML injection Commonly known as: LANTUS Inject 30 Units into the skin at bedtime.   losartan 25 MG tablet Commonly known as: COZAAR Take 0.5 tablets (12.5 mg total) by mouth daily.   metFORMIN 1000 MG tablet Commonly known as: GLUCOPHAGE Take 1 tablet (1,000 mg total) by mouth 2 (two) times daily with a meal. Do not restart Metformin until Wednesday am 7/2 What changed: additional instructions   metoprolol succinate 25 MG 24 hr tablet Commonly known as: TOPROL-XL Take 1 tablet (25 mg total) by mouth daily.   nitroGLYCERIN 0.4 MG SL tablet Commonly known as: NITROSTAT Place 1 tablet (0.4 mg total) under the tongue every 5 (five) minutes x 3 doses as needed for chest pain.   pantoprazole 40 MG tablet Commonly known as: PROTONIX Take 1 tablet (40 mg total) by mouth daily.   pravastatin 80 MG tablet Commonly known as: PRAVACHOL Take 1 tablet (80 mg total) by mouth daily at 6 PM.   sildenafil 50 MG tablet Commonly known as: VIAGRA Take 1 tablet (50 mg total) by mouth daily as needed for erectile dysfunction.         Outstanding Labs/Studies   BMET Friday  Duration of Discharge Encounter   Greater than 30 minutes including physician time.  Jarrett Soho, PA 03/12/2019, 12:25 PM

## 2019-03-12 NOTE — Progress Notes (Signed)
Pt c/o Lt. Shoulder and wrist pain. Patient told that pain is same as heart attack pain. Administered NTG 1 SL. Patient stated that it didn't help much. Patient already received morphine around 0654. Notified Cardiac team regarding this matter. Dr. Rosaland Lao checked patient earlier and it doesn't relate to heart attack. Continue to monitor patient pain. HS [employer]

## 2019-03-12 NOTE — Progress Notes (Signed)
Removed PIV access x 2, and received discharge instructions. Pt's understood it well. Reminded patient to take medications and checking blood glucose level. Pt didn't have any belongings. HS [employer]

## 2019-03-12 NOTE — Telephone Encounter (Signed)
New Message     Pt has TOC appt with Bernerd Pho on 03/28/19 at 2:30pm

## 2019-03-12 NOTE — Progress Notes (Signed)
[id number] Pt seen by Cardiac Rehab in October. Pt stated he has been through this before and is sleepy and not up to ed at this time. Declined ambulation. Stated left arm is hurting. Reviewed NTG use, MI restrictions and encouraged smoking cessation. Left MI booklet, heart healthy, diabetic and low sodium diets, walking for ex, CHF booklet, smoking cessation handout and [employer] brochure. Pt stated he is not interested in CRP 2. Referral sent to [facility]. Discussed daily weights and signs of CHF since EF at 30-35 %. Pt stated has not been able to quit smoking as others in household smoke. Longest cessation was 6 months.  Told pt if he is interested in walking or for me to return and continue education, to let his RN know.  Otherwise educational materials on bedside with paged ear marked that are most important. Graylon Good RN BSN 03/12/2019 9:53 AM

## 2019-03-14 ENCOUNTER — Telehealth: Payer: Self-pay

## 2019-03-14 NOTE — Telephone Encounter (Signed)
Pt states he is aware of his upcoming appt., however he is having headaches.  Please call [phone]  Thanks renee

## 2019-03-14 NOTE — Telephone Encounter (Signed)
Patient took tramadol and drank extra water and headache resolved

## 2019-03-19 ENCOUNTER — Other Ambulatory Visit: Payer: Self-pay | Admitting: *Deleted

## 2019-03-19 NOTE — Patient Outreach (Signed)
[place] [facility]) Care Management  03/19/2019  [name] [birth year] [id number]    EMMI-GENERAL DISCHARGE RED ON EMMI ALERT Day #1 Date:03/14/2019 Red Alert Reason: Don't know who to call with changes/ questions and concerns  Outreach #1 RN attempted to reach pt however unsuccessful. RN able to leave a HIPAA approved voice message requesting a call back.   Plan: Will completed another call attempt over the next week.  Raina Mina, RN Care Management Coordinator [place] Office [phone]

## 2019-03-20 ENCOUNTER — Encounter: Payer: Self-pay | Admitting: *Deleted

## 2019-03-22 ENCOUNTER — Other Ambulatory Visit: Payer: Self-pay | Admitting: *Deleted

## 2019-03-22 NOTE — Patient Outreach (Signed)
[place] [facility]) Care Management  03/22/2019  [name] 1955/03/01 [id number]   EMMI-GENERAL DISCHARGE RED ON EMMI ALERT Day #1 Date: 03/14/2019 Red Alert Reason: Don't know who to call with changes/questions/concerns  Outreach #2 RN attempted outreach call however unsuccessful. RN able to leave a HIPAA approved voice message requesting a call back.  Plan: Will attempted another outreach next week.  Raina Mina, RN Care Management Coordinator [place] Office [phone]

## 2019-03-27 ENCOUNTER — Ambulatory Visit: Payer: Self-pay | Admitting: *Deleted

## 2019-03-27 ENCOUNTER — Other Ambulatory Visit: Payer: Self-pay | Admitting: *Deleted

## 2019-03-27 NOTE — Patient Outreach (Signed)
[place] [facility]) Care Management  03/27/2019  HARVIR CASTLEBERRY 10/24/1955 [id number]   EMMI-GENERAL DISCHARGE RED ON EMMI ALERT Day #1 Date: 03/14/2019 Red Alert Reason: Don't know who to call with changes, Questions/Concerns  Outreach #1:  RN spoke with pt today and verified the above emmis. Pt states he had a visit with his provider already and all issues were resolved with no additional needs.  Plan: Will close with no additional issues to address at this time.  Raina Mina, RN Care Management Coordinator [place] Office [phone]

## 2019-03-28 ENCOUNTER — Ambulatory Visit: Payer: Medicare Other | Admitting: Student

## 2019-04-10 ENCOUNTER — Ambulatory Visit: Payer: Medicare Other | Admitting: Cardiovascular Disease

## 2019-05-14 ENCOUNTER — Other Ambulatory Visit: Payer: Self-pay

## 2019-05-14 ENCOUNTER — Inpatient Hospital Stay (HOSPITAL_COMMUNITY)
Admission: EM | Admit: 2019-05-14 | Discharge: 2019-05-27 | DRG: 901 | Disposition: A | Discharge status: Skilled Nursing Facility | Payer: No Typology Code available for payment source | Attending: Internal Medicine | Admitting: Internal Medicine

## 2019-05-14 ENCOUNTER — Inpatient Hospital Stay (HOSPITAL_COMMUNITY): Payer: No Typology Code available for payment source

## 2019-05-14 ENCOUNTER — Emergency Department (HOSPITAL_COMMUNITY): Payer: No Typology Code available for payment source

## 2019-05-14 ENCOUNTER — Encounter (HOSPITAL_COMMUNITY): Payer: Self-pay

## 2019-05-14 DIAGNOSIS — Z8249 Family history of ischemic heart disease and other diseases of the circulatory system: Secondary | ICD-10-CM

## 2019-05-14 DIAGNOSIS — J439 Emphysema, unspecified: Secondary | ICD-10-CM | POA: Diagnosis not present

## 2019-05-14 DIAGNOSIS — Z833 Family history of diabetes mellitus: Secondary | ICD-10-CM

## 2019-05-14 DIAGNOSIS — J9601 Acute respiratory failure with hypoxia: Secondary | ICD-10-CM | POA: Diagnosis present

## 2019-05-14 DIAGNOSIS — L03314 Cellulitis of groin: Secondary | ICD-10-CM

## 2019-05-14 DIAGNOSIS — T40421A Poisoning by tramadol, accidental (unintentional), initial encounter: Secondary | ICD-10-CM | POA: Diagnosis present

## 2019-05-14 DIAGNOSIS — I10 Essential (primary) hypertension: Secondary | ICD-10-CM | POA: Diagnosis not present

## 2019-05-14 DIAGNOSIS — R600 Localized edema: Secondary | ICD-10-CM | POA: Diagnosis not present

## 2019-05-14 DIAGNOSIS — F17200 Nicotine dependence, unspecified, uncomplicated: Secondary | ICD-10-CM | POA: Diagnosis present

## 2019-05-14 DIAGNOSIS — L03311 Cellulitis of abdominal wall: Secondary | ICD-10-CM | POA: Diagnosis not present

## 2019-05-14 DIAGNOSIS — I248 Other forms of acute ischemic heart disease: Secondary | ICD-10-CM | POA: Diagnosis not present

## 2019-05-14 DIAGNOSIS — N170 Acute kidney failure with tubular necrosis: Secondary | ICD-10-CM | POA: Diagnosis present

## 2019-05-14 DIAGNOSIS — G8929 Other chronic pain: Secondary | ICD-10-CM | POA: Diagnosis present

## 2019-05-14 DIAGNOSIS — N179 Acute kidney failure, unspecified: Secondary | ICD-10-CM

## 2019-05-14 DIAGNOSIS — I252 Old myocardial infarction: Secondary | ICD-10-CM

## 2019-05-14 DIAGNOSIS — T40601A Poisoning by unspecified narcotics, accidental (unintentional), initial encounter: Secondary | ICD-10-CM | POA: Diagnosis not present

## 2019-05-14 DIAGNOSIS — R6520 Severe sepsis without septic shock: Secondary | ICD-10-CM | POA: Diagnosis present

## 2019-05-14 DIAGNOSIS — I255 Ischemic cardiomyopathy: Secondary | ICD-10-CM | POA: Diagnosis not present

## 2019-05-14 DIAGNOSIS — Z20822 Contact with and (suspected) exposure to covid-19: Secondary | ICD-10-CM | POA: Diagnosis present

## 2019-05-14 DIAGNOSIS — Z87891 Personal history of nicotine dependence: Secondary | ICD-10-CM

## 2019-05-14 DIAGNOSIS — E1122 Type 2 diabetes mellitus with diabetic chronic kidney disease: Secondary | ICD-10-CM | POA: Diagnosis present

## 2019-05-14 DIAGNOSIS — R262 Difficulty in walking, not elsewhere classified: Secondary | ICD-10-CM | POA: Diagnosis not present

## 2019-05-14 DIAGNOSIS — I5042 Chronic combined systolic (congestive) and diastolic (congestive) heart failure: Secondary | ICD-10-CM | POA: Diagnosis present

## 2019-05-14 DIAGNOSIS — M726 Necrotizing fasciitis: Secondary | ICD-10-CM | POA: Diagnosis not present

## 2019-05-14 DIAGNOSIS — Z720 Tobacco use: Secondary | ICD-10-CM | POA: Diagnosis not present

## 2019-05-14 DIAGNOSIS — E785 Hyperlipidemia, unspecified: Secondary | ICD-10-CM | POA: Diagnosis not present

## 2019-05-14 DIAGNOSIS — E1165 Type 2 diabetes mellitus with hyperglycemia: Secondary | ICD-10-CM | POA: Diagnosis not present

## 2019-05-14 DIAGNOSIS — G4733 Obstructive sleep apnea (adult) (pediatric): Secondary | ICD-10-CM | POA: Diagnosis present

## 2019-05-14 DIAGNOSIS — R7989 Other specified abnormal findings of blood chemistry: Secondary | ICD-10-CM | POA: Diagnosis not present

## 2019-05-14 DIAGNOSIS — N1832 Chronic kidney disease, stage 3b: Secondary | ICD-10-CM

## 2019-05-14 DIAGNOSIS — I2510 Atherosclerotic heart disease of native coronary artery without angina pectoris: Secondary | ICD-10-CM | POA: Diagnosis present

## 2019-05-14 DIAGNOSIS — R4182 Altered mental status, unspecified: Secondary | ICD-10-CM | POA: Diagnosis not present

## 2019-05-14 DIAGNOSIS — I130 Hypertensive heart and chronic kidney disease with heart failure and stage 1 through stage 4 chronic kidney disease, or unspecified chronic kidney disease: Secondary | ICD-10-CM | POA: Diagnosis present

## 2019-05-14 DIAGNOSIS — M545 Low back pain: Secondary | ICD-10-CM | POA: Diagnosis present

## 2019-05-14 DIAGNOSIS — G9340 Encephalopathy, unspecified: Secondary | ICD-10-CM | POA: Diagnosis not present

## 2019-05-14 DIAGNOSIS — F1410 Cocaine abuse, uncomplicated: Secondary | ICD-10-CM | POA: Diagnosis not present

## 2019-05-14 DIAGNOSIS — J449 Chronic obstructive pulmonary disease, unspecified: Secondary | ICD-10-CM | POA: Diagnosis present

## 2019-05-14 DIAGNOSIS — K219 Gastro-esophageal reflux disease without esophagitis: Secondary | ICD-10-CM | POA: Diagnosis not present

## 2019-05-14 DIAGNOSIS — Z955 Presence of coronary angioplasty implant and graft: Secondary | ICD-10-CM | POA: Diagnosis not present

## 2019-05-14 DIAGNOSIS — A419 Sepsis, unspecified organism: Secondary | ICD-10-CM | POA: Diagnosis not present

## 2019-05-14 DIAGNOSIS — E1151 Type 2 diabetes mellitus with diabetic peripheral angiopathy without gangrene: Secondary | ICD-10-CM | POA: Diagnosis present

## 2019-05-14 DIAGNOSIS — Z7982 Long term (current) use of aspirin: Secondary | ICD-10-CM

## 2019-05-14 DIAGNOSIS — Z6833 Body mass index (BMI) 33.0-33.9, adult: Secondary | ICD-10-CM

## 2019-05-14 DIAGNOSIS — I77811 Abdominal aortic ectasia: Secondary | ICD-10-CM | POA: Diagnosis not present

## 2019-05-14 DIAGNOSIS — Z794 Long term (current) use of insulin: Secondary | ICD-10-CM

## 2019-05-14 DIAGNOSIS — Z741 Need for assistance with personal care: Secondary | ICD-10-CM | POA: Diagnosis not present

## 2019-05-14 DIAGNOSIS — E8809 Other disorders of plasma-protein metabolism, not elsewhere classified: Secondary | ICD-10-CM | POA: Diagnosis not present

## 2019-05-14 DIAGNOSIS — N183 Chronic kidney disease, stage 3 unspecified: Secondary | ICD-10-CM | POA: Diagnosis not present

## 2019-05-14 DIAGNOSIS — I96 Gangrene, not elsewhere classified: Secondary | ICD-10-CM | POA: Diagnosis not present

## 2019-05-14 DIAGNOSIS — Z7902 Long term (current) use of antithrombotics/antiplatelets: Secondary | ICD-10-CM

## 2019-05-14 DIAGNOSIS — I6381 Other cerebral infarction due to occlusion or stenosis of small artery: Secondary | ICD-10-CM | POA: Diagnosis not present

## 2019-05-14 DIAGNOSIS — E1149 Type 2 diabetes mellitus with other diabetic neurological complication: Secondary | ICD-10-CM

## 2019-05-14 DIAGNOSIS — I739 Peripheral vascular disease, unspecified: Secondary | ICD-10-CM | POA: Diagnosis not present

## 2019-05-14 DIAGNOSIS — R569 Unspecified convulsions: Secondary | ICD-10-CM | POA: Diagnosis not present

## 2019-05-14 DIAGNOSIS — L02214 Cutaneous abscess of groin: Secondary | ICD-10-CM | POA: Diagnosis not present

## 2019-05-14 DIAGNOSIS — M25572 Pain in left ankle and joints of left foot: Secondary | ICD-10-CM | POA: Diagnosis present

## 2019-05-14 DIAGNOSIS — I236 Thrombosis of atrium, auricular appendage, and ventricle as current complications following acute myocardial infarction: Secondary | ICD-10-CM | POA: Diagnosis not present

## 2019-05-14 DIAGNOSIS — M6281 Muscle weakness (generalized): Secondary | ICD-10-CM | POA: Diagnosis not present

## 2019-05-14 DIAGNOSIS — G92 Toxic encephalopathy: Secondary | ICD-10-CM | POA: Diagnosis present

## 2019-05-14 DIAGNOSIS — E1140 Type 2 diabetes mellitus with diabetic neuropathy, unspecified: Secondary | ICD-10-CM | POA: Diagnosis not present

## 2019-05-14 DIAGNOSIS — Z48817 Encounter for surgical aftercare following surgery on the skin and subcutaneous tissue: Secondary | ICD-10-CM | POA: Diagnosis not present

## 2019-05-14 DIAGNOSIS — R509 Fever, unspecified: Secondary | ICD-10-CM | POA: Diagnosis not present

## 2019-05-14 LAB — CBC WITH DIFFERENTIAL/PLATELET
Abs Immature Granulocytes: 0.53 10*3/uL — ABNORMAL HIGH (ref 0.00–0.07)
Basophils Absolute: 0.1 10*3/uL (ref 0.0–0.1)
Basophils Relative: 0 %
Eosinophils Absolute: 0 10*3/uL (ref 0.0–0.5)
Eosinophils Relative: 0 %
HCT: 44.2 % (ref 39.0–52.0)
Hemoglobin: 13.5 g/dL (ref 13.0–17.0)
Immature Granulocytes: 2 %
Lymphocytes Relative: 4 %
Lymphs Abs: 0.8 10*3/uL (ref 0.7–4.0)
MCH: 26.2 pg (ref 26.0–34.0)
MCHC: 30.5 g/dL (ref 30.0–36.0)
MCV: 85.7 fL (ref 80.0–100.0)
Monocytes Absolute: 1.8 10*3/uL — ABNORMAL HIGH (ref 0.1–1.0)
Monocytes Relative: 8 %
Neutro Abs: 18.5 10*3/uL — ABNORMAL HIGH (ref 1.7–7.7)
Neutrophils Relative %: 86 %
Platelets: 290 10*3/uL (ref 150–400)
RBC: 5.16 MIL/uL (ref 4.22–5.81)
RDW: 17.2 % — ABNORMAL HIGH (ref 11.5–15.5)
WBC: 21.7 10*3/uL — ABNORMAL HIGH (ref 4.0–10.5)
nRBC: 0 % (ref 0.0–0.2)

## 2019-05-14 LAB — COMPREHENSIVE METABOLIC PANEL
ALT: 19 U/L (ref 0–44)
AST: 24 U/L (ref 15–41)
Albumin: 3 g/dL — ABNORMAL LOW (ref 3.5–5.0)
Alkaline Phosphatase: 77 U/L (ref 38–126)
Anion gap: 18 — ABNORMAL HIGH (ref 5–15)
BUN: 37 mg/dL — ABNORMAL HIGH (ref 8–23)
CO2: 22 mmol/L (ref 22–32)
Calcium: 8.4 mg/dL — ABNORMAL LOW (ref 8.9–10.3)
Chloride: 97 mmol/L — ABNORMAL LOW (ref 98–111)
Creatinine, Ser: 2.95 mg/dL — ABNORMAL HIGH (ref 0.61–1.24)
GFR calc Af Amer: 25 mL/min — ABNORMAL LOW (ref >60)
GFR calc non Af Amer: 21 mL/min — ABNORMAL LOW (ref >60)
Glucose, Bld: 246 mg/dL — ABNORMAL HIGH (ref 70–99)
Potassium: 4.1 mmol/L (ref 3.5–5.1)
Sodium: 137 mmol/L (ref 135–145)
Total Bilirubin: 0.9 mg/dL (ref 0.3–1.2)
Total Protein: 7.6 g/dL (ref 6.5–8.1)

## 2019-05-14 LAB — PROTIME-INR
INR: 2 — ABNORMAL HIGH (ref 0.8–1.2)
Prothrombin Time: 22 seconds — ABNORMAL HIGH (ref 11.4–15.2)

## 2019-05-14 LAB — URINALYSIS, ROUTINE W REFLEX MICROSCOPIC
Bilirubin Urine: NEGATIVE
Glucose, UA: >=500 mg/dL — AB
Ketones, ur: NEGATIVE mg/dL
Leukocytes,Ua: NEGATIVE
Nitrite: NEGATIVE
Protein, ur: 30 mg/dL — AB
Specific Gravity, Urine: 1.013 (ref 1.005–1.030)
pH: 5 (ref 5.0–8.0)

## 2019-05-14 LAB — GLUCOSE, CAPILLARY
Glucose-Capillary: 108 mg/dL — ABNORMAL HIGH (ref 70–99)
Glucose-Capillary: 94 mg/dL (ref 70–99)

## 2019-05-14 LAB — ACETAMINOPHEN LEVEL: Acetaminophen (Tylenol), Serum: <10 ug/mL — ABNORMAL LOW (ref 10–30)

## 2019-05-14 LAB — RESPIRATORY PANEL BY RT PCR (FLU A&B, COVID)
Influenza A by PCR: NEGATIVE
Influenza B by PCR: NEGATIVE
SARS Coronavirus 2 by RT PCR: NEGATIVE

## 2019-05-14 LAB — RAPID URINE DRUG SCREEN, HOSP PERFORMED
Amphetamines: NOT DETECTED
Barbiturates: NOT DETECTED
Benzodiazepines: POSITIVE — AB
Cocaine: POSITIVE — AB
Opiates: NOT DETECTED
Tetrahydrocannabinol: NOT DETECTED

## 2019-05-14 LAB — TROPONIN I (HIGH SENSITIVITY)
Troponin I (High Sensitivity): 13 ng/L (ref <18)
Troponin I (High Sensitivity): 12 ng/L (ref <18)

## 2019-05-14 LAB — MRSA PCR SCREENING: MRSA by PCR: NEGATIVE

## 2019-05-14 LAB — LACTIC ACID, PLASMA
Lactic Acid, Venous: 6 mmol/L (ref 0.5–1.9)
Lactic Acid, Venous: 4.3 mmol/L (ref 0.5–1.9)
Lactic Acid, Venous: 1.8 mmol/L (ref 0.5–1.9)

## 2019-05-14 LAB — CBG MONITORING, ED: Glucose-Capillary: 224 mg/dL — ABNORMAL HIGH (ref 70–99)

## 2019-05-14 LAB — ETHANOL: Alcohol, Ethyl (B): <10 mg/dL (ref <10)

## 2019-05-14 LAB — SALICYLATE LEVEL: Salicylate Lvl: <7 mg/dL — ABNORMAL LOW (ref 7.0–30.0)

## 2019-05-14 MED ORDER — ASPIRIN EC 81 MG PO TBEC
81.0000 mg | DELAYED_RELEASE_TABLET | Freq: Every day | ORAL | Status: DC
  Administered 2019-05-15 – 2019-05-17 (×3): 81 mg via ORAL
  Filled 2019-05-14 (×3): qty 1

## 2019-05-14 MED ORDER — INSULIN ASPART 100 UNIT/ML ~~LOC~~ SOLN
0.0000 [IU] | SUBCUTANEOUS | Status: DC
  Administered 2019-05-15 (×2): 2 [IU] via SUBCUTANEOUS
  Administered 2019-05-15 – 2019-05-16 (×3): 5 [IU] via SUBCUTANEOUS

## 2019-05-14 MED ORDER — SODIUM CHLORIDE 0.9 % IV BOLUS (SEPSIS)
1000.0000 mL | Freq: Once | INTRAVENOUS | Status: AC
  Administered 2019-05-14: 1000 mL via INTRAVENOUS

## 2019-05-14 MED ORDER — SODIUM CHLORIDE 0.9 % IV SOLN
2.0000 g | Freq: Once | INTRAVENOUS | Status: AC
  Administered 2019-05-14: 2 g via INTRAVENOUS
  Filled 2019-05-14: qty 2

## 2019-05-14 MED ORDER — ONDANSETRON HCL 4 MG PO TABS
4.0000 mg | ORAL_TABLET | Freq: Four times a day (QID) | ORAL | Status: DC | PRN
  Filled 2019-05-14: qty 1

## 2019-05-14 MED ORDER — SODIUM CHLORIDE 0.9 % IV SOLN
2.0000 g | Freq: Two times a day (BID) | INTRAVENOUS | Status: DC
  Administered 2019-05-14 – 2019-05-18 (×7): 2 g via INTRAVENOUS
  Filled 2019-05-14 (×7): qty 2

## 2019-05-14 MED ORDER — VANCOMYCIN HCL 2000 MG/400ML IV SOLN
2000.0000 mg | Freq: Once | INTRAVENOUS | Status: AC
  Administered 2019-05-14: 2000 mg via INTRAVENOUS
  Filled 2019-05-14: qty 400

## 2019-05-14 MED ORDER — SODIUM CHLORIDE 0.9 % IV BOLUS
1000.0000 mL | Freq: Once | INTRAVENOUS | Status: AC
  Administered 2019-05-14: 1000 mL via INTRAVENOUS

## 2019-05-14 MED ORDER — ACETAMINOPHEN 650 MG RE SUPP
650.0000 mg | Freq: Four times a day (QID) | RECTAL | Status: DC | PRN
  Filled 2019-05-14: qty 1

## 2019-05-14 MED ORDER — VANCOMYCIN HCL 1250 MG/250ML IV SOLN
1250.0000 mg | INTRAVENOUS | Status: DC
  Administered 2019-05-15 – 2019-05-19 (×5): 1250 mg via INTRAVENOUS
  Filled 2019-05-14 (×5): qty 250

## 2019-05-14 MED ORDER — ACETAMINOPHEN 325 MG PO TABS
650.0000 mg | ORAL_TABLET | Freq: Four times a day (QID) | ORAL | Status: DC | PRN
  Administered 2019-05-15 – 2019-05-25 (×8): 650 mg via ORAL
  Filled 2019-05-14 (×9): qty 2

## 2019-05-14 MED ORDER — SODIUM CHLORIDE 0.9 % IV BOLUS (SEPSIS): 500.0000 mL | Freq: Once | INTRAVENOUS | Status: DC

## 2019-05-14 MED ORDER — NALOXONE HCL 0.4 MG/ML IJ SOLN
INTRAMUSCULAR | Status: AC
  Filled 2019-05-14: qty 1

## 2019-05-14 MED ORDER — HEPARIN SODIUM (PORCINE) 5000 UNIT/ML IJ SOLN
5000.0000 [IU] | Freq: Three times a day (TID) | INTRAMUSCULAR | Status: AC
  Administered 2019-05-14 – 2019-05-18 (×11): 5000 [IU] via SUBCUTANEOUS
  Filled 2019-05-14 (×11): qty 1

## 2019-05-14 MED ORDER — NALOXONE HCL 0.4 MG/ML IJ SOLN
0.4000 mg | Freq: Once | INTRAMUSCULAR | Status: AC
  Administered 2019-05-14: 0.4 mg via INTRAVENOUS

## 2019-05-14 MED ORDER — CLOPIDOGREL BISULFATE 75 MG PO TABS
75.0000 mg | ORAL_TABLET | Freq: Every day | ORAL | Status: DC
  Administered 2019-05-15 – 2019-05-19 (×5): 75 mg via ORAL
  Filled 2019-05-14 (×5): qty 1

## 2019-05-14 MED ORDER — ONDANSETRON HCL 4 MG/2ML IJ SOLN
4.0000 mg | Freq: Four times a day (QID) | INTRAMUSCULAR | Status: DC | PRN
  Administered 2019-05-16 – 2019-05-18 (×5): 4 mg via INTRAVENOUS
  Filled 2019-05-14 (×5): qty 2

## 2019-05-14 MED ORDER — SODIUM CHLORIDE 0.9 % IV SOLN: INTRAVENOUS | Status: AC

## 2019-05-14 MED ORDER — VANCOMYCIN HCL IN DEXTROSE 1-5 GM/200ML-% IV SOLN
1000.0000 mg | Freq: Once | INTRAVENOUS | Status: DC
  Filled 2019-05-14: qty 200

## 2019-05-14 NOTE — Progress Notes (Signed)
Pharmacy Antibiotic Note  [name] is a 63 y.o. male admitted on 05/14/2019 with infection of unknown source. Pharmacy has been consulted for vancomycin and cefepime  dosing.   Plan: Start cefepime 2g IV q12h Loading dose: vancomycin 2g  IV x1 dose Maintenance dose:   vancomycin 1.25g IV  q24h Predicted AUC:  483  mcg*h/mL  ( used Scr of: 2.95mg /dL) Scr used: 2.95mg /dL Pharmacy will continue to monitor renal function, vancomycin levels as clinically appropriate,  cultures and patient progress.  Height: 6' (182.9 cm) Weight: 113.4 kg (250 lb) IBW/kg (Calculated) : 77.6  Temp (24hrs), Avg:102.6 F (39.2 C), Min:102.6 F (39.2 C), Max:102.6 F (39.2 C)    No Known Allergies  Antimicrobials this admission: cefepime 5/4>>    vancomycin 5/4 >>  Microbiology results: 5/4 BC x2:   5/4 UCx:  5/4 Resp PCR: SARS CoV-2: negative      Flu A/B:  negatvie 5/4 MRSA PCR:  negative  Thank you for allowing pharmacy to be a part of this patient's care.  Despina Pole 05/14/2019 12:35 PM

## 2019-05-14 NOTE — H&P (Addendum)
History and Physical    [name] [id number] DOB: [birth year] DOA: 05/14/2019  PCP: System, Pcp Not In   Patient coming from: Home  I have personally briefly reviewed patient's old medical records in [place]  Chief Complaint: Generalized weakness, altered mental status  HPI: [name] is a 64 y.o. male with medical history significant for CAD, DM, systolic CHF, CKD 3, hypertension, diabetes, cocaine use, OSA, peripheral vascular disease, apical mural thrombus. Patient was brought to the ED via EMS with reports of overdose on tramadol-patient reported he had taken 3 doses of tramadol he was found in the bathtub, apparently he was soaking his whole left groin/perineal area.  At that time he was awake and alert.  Patient's blood pressure was soft/low, subsequently EMS sat patient up to get his vitals, there was a report of seizure activity, patient was unresponsive and shaky/tremulous.  He was given 5 mg of Versed, 500 mill bolus of fluids.   On my evaluation, patient awakens to touch and voice, but drifts back to sleep.  He tells me he took just 2 doses of Ultram.  Reports pain and swelling in his left groin.  He denies difficulty breathing or cough, no abdominal pain, no pain with urination.   Recent hospitalization 2/28 - 03/12/19 under cardiology service for NSTEMI, troponin peaking at 575, requiring PCI of RCA/PDA, via cardiac cath 03/11/19.  On dual antiplatelets Plavix and aspirin.  Echocardiogram also showed apical filling defect consistent with thrombus/early thrombus formation.  Plan was to continue aspirin and Plavix for 30 days, to discontinue aspirin then and add Xarelto and continue Plavix long-term. Echo showed EF of 30 to 35%, due to history of intermittent compliance, was felt not to be a candidate for ICD.  Unless he could demonstrate compliance with medications and follow-up in the future.  ED Course: On arrival to the ED, patient was unresponsive, he was given  0.4 of Narcan mental status significantly improved.  Blood pressure dropped to 86/65 in the ED, improved after 3.5 L sepsis fluid bolus was given in ED.  Febrile to 102.6.  Initial tachycardia 104.  Lactic acid was significantly elevated at 6 >> 4.3.  Creatinine elevated at 2.95.  Respiratory panel negative for COVID-19 and influenza.   Portable chest x-ray mild bilateral interstitial prominence, tiny bilateral pleural effusions and mild CHF cannot be excluded. Abdominal/pelvic CT without contrast-extensive inflammatory changes in the left inguinal region extending into the anterior medial upper thigh compatible with acute cellulitis.  No associated abscess is present.  Also low-density left perirectal fluid collection measuring up to 25 mm. UA Pending, blood and urine cultures ordered.  IV Vanco and cefepime started.  EDP talked to Dr. Melvyn Novas, who felt patient was appropriate for hospitalist admission, can consult if additional support needed. Hospitalist to admit for severe sepsis.  Review of Systems: As per HPI all other systems reviewed and negative.  Past Medical History:  Diagnosis Date  . Bulging lumbar disc   . CAD (coronary artery disease) [phone]   a. prior LAD stenting. b. s/p DES to [facility] 08/2015 @ [facility]. c. 04/2016 Cardiac cath at [facility]. # vessel CAD, with patent stent in the PLAD and RCA. Diffuse dLAD, OM2, and  RPDA disease. No aortic stenosis. Elevated LVEDP. No lesion to suggest ACS. Continue medical managment.  . Chronic lower back pain   . CKD (chronic kidney disease), stage II   . Cocaine abuse ([place])   . DM2 (diabetes mellitus,  type 2) ([place])   . GERD (gastroesophageal reflux disease)   . Headache    "often; not regular" (11/30/2017)  . HTN (hypertension)   . Hyperlipidemia   . Ischemic cardiomyopathy   . MI, old   . Obesity   . Pneumonia    hx  . Sleep apnea    "have machine; I don't use it" (11/30/2017)  . Tobacco use     Past Surgical History:  Procedure  Laterality Date  . APPENDECTOMY    . CARDIAC CATHETERIZATION N/A 09/07/2015   Procedure: Left Heart Cath and Coronary Angiography;  Surgeon: Leonie Man, MD;  Location: [place] [facility];  Service: Cardiovascular;  Laterality: N/A;  . CARDIAC CATHETERIZATION N/A 09/07/2015   Procedure: Coronary Stent Intervention;  Surgeon: Leonie Man, MD;  Location: [place] [facility];  Service: Cardiovascular;  Laterality: N/A;  . CORONARY ANGIOGRAM  09/07/13   residual RCA and OM disease  . CORONARY ANGIOPLASTY WITH STENT PLACEMENT    . CORONARY STENT INTERVENTION N/A 10/16/2018   Procedure: CORONARY STENT INTERVENTION;  Surgeon: Burnell Blanks, MD;  Location: [place] [facility];  Service: Cardiovascular;  Laterality: N/A;  . CORONARY STENT INTERVENTION N/A 03/11/2019   Procedure: CORONARY STENT INTERVENTION;  Surgeon: [name], MD;  Location: [place] [facility];  Service: Cardiovascular;  Laterality: N/A;  . FRACTURE SURGERY    . INSERTION OF ILIAC STENT Left 11/30/2017   Left external illiac stent  . INSERTION OF ILIAC STENT  11/30/2017   Procedure: Insertion Of Iliac Stent;  Surgeon: Lorretta Harp, MD;  Location: [place] [facility];  Service: Cardiovascular;;  Left external illiac stent  . KNEE ARTHROSCOPY Left   . KNEE SURGERY     "ligaments, cartilage; tendon, put a pin in" (11/30/2017)  . LEFT HEART CATH Bilateral 07/08/2012   Procedure: LEFT HEART CATH;  Surgeon: [name], MD;  Location: [facility] CATH LAB;  Service: Cardiovascular;  Laterality: Bilateral;  . LEFT HEART CATH AND CORONARY ANGIOGRAPHY N/A 10/16/2018   Procedure: LEFT HEART CATH AND CORONARY ANGIOGRAPHY;  Surgeon: Burnell Blanks, MD;  Location: [place] [facility];  Service: Cardiovascular;  Laterality: N/A;  . LEFT HEART CATH AND CORONARY ANGIOGRAPHY N/A 03/11/2019   Procedure: LEFT HEART CATH AND CORONARY ANGIOGRAPHY;  Surgeon: [name], MD;  Location: [place] [facility];  Service:  Cardiovascular;  Laterality: N/A;  . LEFT HEART CATHETERIZATION WITH CORONARY ANGIOGRAM N/A 09/06/2013   STEMI, 2nd ISR LAD. Procedure: LEFT HEART CATHETERIZATION WITH CORONARY ANGIOGRAM;  Surgeon: [name], MD;  Location: [facility] CATH LAB;  Service: Cardiovascular;  Laterality: N/A;  . LOWER EXTREMITY ANGIOGRAPHY N/A 11/30/2017   Procedure: LOWER EXTREMITY ANGIOGRAPHY;  Surgeon: Lorretta Harp, MD;  Location: [place] [facility];  Service: Cardiovascular;  Laterality: N/A;  . PERCUTANEOUS CORONARY STENT INTERVENTION (PCI-S)  07/08/2012   Procedure: PERCUTANEOUS CORONARY STENT INTERVENTION (PCI-S);  Surgeon: [name], MD;  Location: [facility] CATH LAB;  Service: Cardiovascular;;  DES LAD  . PERCUTANEOUS CORONARY STENT INTERVENTION (PCI-S) N/A 09/06/2013   Procedure: PERCUTANEOUS CORONARY STENT INTERVENTION (PCI-S);  Surgeon: [name], MD;  Location: [facility] CATH LAB;  Service: Cardiovascular;  Laterality: N/A;  Mid LAD 3.0/24mm Promus  . WRIST FRACTURE SURGERY Bilateral      reports that he has quit smoking. His smoking use included cigarettes. He has a 24.00 pack-year smoking history. He has never used smokeless tobacco. He reports current alcohol use. He  reports that he does not use drugs.  No Known Allergies  Family History  Problem Relation Age of Onset  . Hypertension Mother   . Diabetes Mother     Prior to Admission medications   Medication Sig Start Date End Date Taking? Authorizing Provider  aspirin EC 81 MG tablet Take 1 tablet (81 mg total) by mouth daily. 10/17/18 10/17/19  Daune Perch, NP  clopidogrel (PLAVIX) 75 MG tablet Take 1 tablet (75 mg total) by mouth daily with breakfast. 02/06/19 02/01/20  Strader, Fransisco Hertz, PA-C  insulin glargine (LANTUS) 100 UNIT/ML injection Inject 30 Units into the skin at bedtime.     [provider]  losartan (COZAAR) 25 MG tablet Take 0.5 tablets (12.5 mg total) by mouth daily. 02/06/19 05/07/19  Strader, Fransisco Hertz,  PA-C  metFORMIN (GLUCOPHAGE) 1000 MG tablet Take 1 tablet (1,000 mg total) by mouth 2 (two) times daily with a meal. Do not restart Metformin until Wednesday am 7/2 Patient taking differently: Take 1,000 mg by mouth 2 (two) times daily with a meal.  07/10/12   Turner, Eber Hong, MD  metoprolol succinate (TOPROL-XL) 25 MG 24 hr tablet Take 1 tablet (25 mg total) by mouth daily. 10/18/18 10/18/19  Daune Perch, NP  nitroGLYCERIN (NITROSTAT) 0.4 MG SL tablet Place 1 tablet (0.4 mg total) under the tongue every 5 (five) minutes x 3 doses as needed for chest pain. 10/05/18   Herminio Commons, MD  pantoprazole (PROTONIX) 40 MG tablet Take 1 tablet (40 mg total) by mouth daily. 02/06/19 02/01/20  Ahmed Prima, Fransisco Hertz, PA-C  pravastatin (PRAVACHOL) 80 MG tablet Take 1 tablet (80 mg total) by mouth daily at 6 PM. 10/17/18 10/12/19  Daune Perch, NP  sildenafil (VIAGRA) 50 MG tablet Take 1 tablet (50 mg total) by mouth daily as needed for erectile dysfunction. 02/06/19   Erma Heritage, PA-C    Physical Exam: Vitals:   05/14/19 1405 05/14/19 1405 05/14/19 1430 05/14/19 1449  BP: 118/74 118/74 117/80 109/73  Pulse: (!) 101 100  99  Resp: (!) 22 (!) 22 (!) 21 20  Temp:    99.9 F (37.7 C)  TempSrc:    Oral  SpO2: 92% 96%  94%  Weight:      Height:        Constitutional: Comfortable, generally weak, awakens to voice and touch. Vitals:   05/14/19 1405 05/14/19 1405 05/14/19 1430 05/14/19 1449  BP: 118/74 118/74 117/80 109/73  Pulse: (!) 101 100  99  Resp: (!) 22 (!) 22 (!) 21 20  Temp:    99.9 F (37.7 C)  TempSrc:    Oral  SpO2: 92% 96%  94%  Weight:      Height:       Eyes: Pupils about 3 mm, equal and symmetric, lids and conjunctivae normal ENMT: Mucous membranes are dry, .  Neck: normal, supple, no masses, no thyromegaly Respiratory: clear to auscultation bilaterally, no wheezing, no crackles. Normal respiratory effort. No accessory muscle use.  Cardiovascular: mildly tachycardic,  regular rate and rhythm, no murmurs / rubs / gallops. No extremity edema. 2+ pedal pulses.  Abdomen: full, no tenderness, no masses palpated. No hepatosplenomegaly. Bowel sounds positive.  Musculoskeletal: no clubbing / cyanosis. No joint deformity upper and lower extremities. Good ROM, no contractures.   Skin: marked tenderness, erythema and swelling, mild induration to left groin area, extending at least 8 to 10 cm distally from inguinal line, no wounds, ulcers or drainage. Neurologic: Speech clear, fluent,  no facial asymmetry, moving bilateral upper and lower extremities spontaneously, generalized weakness, not able to move lower extremities against gravity. Psychiatric: Normal judgment and insight. Alert and oriented x 3. Normal mood.   Labs on Admission: I have personally reviewed following labs and imaging studies  CBC: Recent Labs  Lab 05/14/19 1204  WBC 21.7*  NEUTROABS PENDING  HGB 13.5  HCT 44.2  MCV 85.7  PLT [id number]   Basic Metabolic Panel: Recent Labs  Lab 05/14/19 1204  NA 137  K 4.1  CL 97*  CO2 22  GLUCOSE 246*  BUN 37*  CREATININE 2.95*  CALCIUM 8.4*   Liver Function Tests: Recent Labs  Lab 05/14/19 1204  AST 24  ALT 19  ALKPHOS 77  BILITOT 0.9  PROT 7.6  ALBUMIN 3.0*    Coagulation Profile: Recent Labs  Lab 05/14/19 1242  INR 2.0*   CBG: Recent Labs  Lab 05/14/19 1207  [place] 224*    Radiological Exams on Admission: CT Abdomen Pelvis Wo Contrast  Result Date: 05/14/2019 CLINICAL DATA:  Soft tissue infection suspected, pelvis. EXAM: CT ABDOMEN AND PELVIS WITHOUT CONTRAST TECHNIQUE: Multidetector CT imaging of the abdomen and pelvis was performed following the standard protocol without IV contrast. COMPARISON:  CTA of the abdomen and pelvis /5/20. CT stone study 11/24/2016. FINDINGS: Lower chest: Areas of dependent airspace disease are present in the medial lower lobes bilaterally, left greater than right. Atherosclerotic calcifications present  the coronary arteries. Heart size is normal. Hepatobiliary: Gallbladder is mostly collapsed. Liver is unremarkable. Common bile duct is within normal limits. Pancreas: Unremarkable. No pancreatic ductal dilatation or surrounding inflammatory changes. Spleen: Normal in size without focal abnormality. Adrenals/Urinary Tract: Adrenal glands are normal bilaterally. There is some stranding about both kidneys. No obstruction is present. No mass lesion or stone is evident. Atherosclerotic changes are present. Ureters are within normal limits. Urinary bladder is unremarkable. Stomach/Bowel: Stomach and duodenum are within normal limits. Small bowel is unremarkable. Terminal ileum is normal. Ascending transverse colon are normal. Descending and sigmoid colon are normal. Vascular/Lymphatic: Extensive atherosclerotic calcifications are again noted. Left iliac stent is in place. Aneurysmal dilation is present. 7 and 8 mm left inguinal lymph nodes are slightly more prominent than the prior exam. Left femoral lymph nodes have increased in size, likely reactive. Reproductive: Prostate calcifications are present without significant large mint or focal lesion. Other: Skin thickening and extensive subcutaneous edematous changes are present the medial upper left thigh extending into the inguinal region. No discrete abscess or free air is present. No superior extension is present. A well-defined subcutaneous low-density collection along the medial left gluteal cleft measures 8.3 x 7.6 x 25.6 mm. No significant stranding is associated. Musculoskeletal: Progressive disc disease is present at L5-S1 with central and bilateral foraminal stenosis. Vacuum disc is now present. Broad-based disc protrusion is also present at L3-4 and L4-5. IMPRESSION: 1. Extensive inflammatory changes the left inguinal region extending into the anterior and medial upper thigh compatible with acute cellulitis. No associated abscess is present. 2. Associated  reactive adenopathy. 3. Ascending inflammatory changes. 4. Low-density left perirectal fluid collection measuring up to 25 mm. No associated inflammatory changes are present. This may represent an early or healing perirectal abscess. The area should be amenable to direct visualization. 5. Progressive lower lumbar disc disease. Electronically Signed   By: San Morelle M.D.   On: 05/14/2019 14:29   DG Chest Port 1 View  Result Date: 05/14/2019 CLINICAL DATA:  Fever.  Overdose. EXAM: PORTABLE  CHEST 1 VIEW COMPARISON:  03/10/2019. FINDINGS: Cardiomegaly. Bilateral mild interstitial prominence. Tiny bilateral pleural effusions can not be excluded. Mild CHF cannot be excluded. No pneumothorax. Degenerative change thoracic spine. IMPRESSION: Cardiomegaly. Mild bilateral interstitial prominence. Tiny bilateral pleural effusions can not be excluded. Mild CHF cannot be excluded. Electronically Signed   By: Marcello Moores  Register   On: 05/14/2019 12:23    EKG: Independently reviewed.  Sinus rhythm, QTC 435.  Mild and nonspecific abnormalities in lead I, aVL, V5, V6, compared to prior EKG.  Assessment/Plan Active Problems:   Severe sepsis (HCC)  Severe sepsis- likely secondary to cellulitis involving the left groin region.  No wounds or ulcers.  Febrile to 102.6, significant leukocytosis of 21, tachycardic, tachypneic, with hypotension systolic down to [id number], with marked lactic acidosis 6 > 4.3, and endorgan involvement with acute kidney injury.  CT abdomen and pelvis shows cellulitis without associated abscess.  Also shows 25 mm left perirectal fluid collection-May represent early or healing perirectal abscess. -3.5 L fluid bolus given, continue N/s 75ccc/hr x 15 hrs -Trend lactic acid -Follow-up UA, blood and urine cultures -Continue broad-spectrum antibiotics IV vancomycin and cefepime -Hold home losartan, metoprolol, -CBC, BMP  Acute kidney injury- creatinine 2.95, recent baseline 1.5-1.6.  Likely ATN  from sepsis versus prerenal, in the setting of losartan use. -Hold home losartan -IV fluids -BMP a.m.  Metabolic encephalopathy- likely combination of severe sepsis and ? Opioid use, and subsequent Versed given by EMS.  Mental status improved with 0.4 mg Narcan in the ED. EMS notes tremulousness/shaking, ?  Seizure activity.  But likely shaking and tremulousness was more from hypotension.  -Obtain head CT -IV antibiotics, IV fluids. - NPO for now pending improvement in mental status - Seizure precautions  Acute respiratory failure- likely from opioid use and Versed.  Initially on 15 L O2, down to 4 L.  On my evaluation on room air he is 91%.  He is not on home O2.  Denies respiratory symptoms.  Chest x-ray-CHF cannot be excluded. -Supplemental O2 -Monitor closely if further work-up needed.  Systolic CHF-stable and compensated.  Patient requiring IV fluids in setting of severe sepsis.  Echo 03/2019-EF 30 to 35%, G1DD.  Felt not to be a candidate for ICD due to history of intermittent compliance.  Not on diuretics. -Hold losartan and metoprolol in the setting of hypotension, and acute kidney injury. -Cautious hydration. - Intake and output - Daily weights  Coronary artery disease-history of multivessel coronary artery disease.  Most recent stent 03/11/19 during recent hospitalization, requiring PCI of RCA/PDA. - Resume Aspirin and Plavix  Hypertension-currently septic with hypotension. -Hold losartan and metoprolol.  Uncontrolled diabetes mellitus-random glucose 246.  Recent A1c 11.7 - SSI -Metformin, Lantus  Obstructive sleep apnea-not on CPAP at home.   DVT prophylaxis: Heparin Code Status: Full code Family Communication: None at bedside. Disposition Plan: > 2 days, pending improvement in cardiorespiratory status, and treatment of severe sepsis. Consults called: None. Admission status: Inpatient, stepdown I certify that at the point of admission it is my clinical judgment that  the patient will require inpatient hospital care spanning beyond 2 midnights from the point of admission due to high intensity of service, high risk for further deterioration and high frequency of surveillance required.    Status is: Inpatient Remains inpatient appropriate because:Hemodynamically unstable and Inpatient level of care appropriate due to severity of illness Dispo: The patient is from: Home              Anticipated  d/c is to: Home              Anticipated d/c date is: 3 days              Bethena Roys MD [facility]  05/14/2019, 5:03 PM

## 2019-05-14 NOTE — ED Provider Notes (Addendum)
[facility] EMERGENCY DEPARTMENT Provider Note   CSN: [id number] Arrival date & time: 05/14/19  1149     History Chief Complaint  Patient presents with  . Drug Overdose    [name] is a 64 y.o. male.  Patient presents via EMS. EMS indicates called out for general weakness, altered ms, possible due to taking extra tramadol. When they arrived, patient in bathtub, soaking a sore area left groin/perineal area. Pt was awake, oriented then, CBG in 200s. Pt indicated to EMS he had taken only a total of 3 ultram tablets.  EMS noted initial bp soft/low. EMS sat pt up in chair 'to get vitals/asses pt', when patient 'had a seizure' - pt became unresponsive and seemed shaky/tremulous. EMS gave versed 5 mg iv and transported. Pt received NS bolus in route with improvement in BP. On arrival to ED pt unresponsive verbally, is breathing, has pulse, bp improved, and pt noted to be febrile.   The history is provided by the patient and the EMS personnel. The history is limited by the condition of the patient.  Drug Overdose Pertinent negatives include no chest pain, no abdominal pain, no headaches and no shortness of breath.       Past Medical History:  Diagnosis Date  . Bulging lumbar disc   . CAD (coronary artery disease) [phone]   a. prior LAD stenting. b. s/p DES to [facility] 08/2015 @ [facility]. c. 04/2016 Cardiac cath at [facility]. # vessel CAD, with patent stent in the PLAD and RCA. Diffuse dLAD, OM2, and  RPDA disease. No aortic stenosis. Elevated LVEDP. No lesion to suggest ACS. Continue medical managment.  . Chronic lower back pain   . CKD (chronic kidney disease), stage II   . Cocaine abuse ([place])   . DM2 (diabetes mellitus, type 2) ([place])   . GERD (gastroesophageal reflux disease)   . Headache    "often; not regular" (11/30/2017)  . HTN (hypertension)   . Hyperlipidemia   . Ischemic cardiomyopathy   . MI, old   . Obesity   . Pneumonia    hx  . Sleep apnea    "have machine; I  don't use it" (11/30/2017)  . Tobacco use     Patient Active Problem List   Diagnosis Date Noted  . Unstable angina ([place]) 10/16/2018  . Apical mural thrombus 10/15/2018  . Claudication in peripheral vascular disease ([place]) 11/30/2017  . Peripheral arterial disease ([place]) 11/28/2017  . Chest pain 11/03/2016  . Constipation 11/03/2016  . CKD (chronic kidney disease), stage II 08/02/2016  . Cellulitis of right hand 08/02/2016  . Chronic pain 08/02/2016  . AKI (acute kidney injury) ([place]) 01/31/2016  . Lobar pneumonia ([place]) 01/31/2016  . Influenza A 01/31/2016  . Diarrhea 01/30/2016  . NSVT (nonsustained ventricular tachycardia) ([place]) 09/12/2015  . Precordial chest pain 09/10/2015  . Cocaine use 09/04/2015  . Near syncope 06/29/2015  . Essential hypertension 06/29/2015  . Status post coronary artery stent placement 06/29/2015  . Insulin dependent diabetes mellitus 06/29/2015  . History of MI (myocardial infarction) 06/29/2015  . Chronic systolic CHF [id number]  . Musculoskeletal chest pain 05/27/2015  . OSA on CPAP 01/21/2015  . Rectal bleeding   . Stage 3 chronic renal impairment associated with type 2 diabetes mellitus ([place]) 09/09/2013  . 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 CAD (coronary artery disease) 09/09/2013  . Cardiomyopathy, ischemic-EF 40-45% by echo 09/07/13 09/09/2013  . S/P LAD DES June 2014 09/06/2013  .  Tobacco use disorder 09/06/2013  . Hyperglycemia 08/22/2012  . NSTEMI (non-ST elevated myocardial infarction) ([place]) 07/07/2012  . Dyslipidemia 07/07/2012  . Hypertensive heart disease     Past Surgical History:  Procedure Laterality Date  . APPENDECTOMY    . CARDIAC CATHETERIZATION N/A 09/07/2015   Procedure: Left Heart Cath and Coronary Angiography;  Surgeon: Leonie Man, MD;  Location: [place] [facility];  Service: Cardiovascular;  Laterality: N/A;  . CARDIAC CATHETERIZATION N/A 09/07/2015   Procedure: Coronary Stent Intervention;   Surgeon: Leonie Man, MD;  Location: [place] [facility];  Service: Cardiovascular;  Laterality: N/A;  . CORONARY ANGIOGRAM  09/07/13   residual RCA and OM disease  . CORONARY ANGIOPLASTY WITH STENT PLACEMENT    . CORONARY STENT INTERVENTION N/A 10/16/2018   Procedure: CORONARY STENT INTERVENTION;  Surgeon: Burnell Blanks, MD;  Location: [place] [facility];  Service: Cardiovascular;  Laterality: N/A;  . CORONARY STENT INTERVENTION N/A 03/11/2019   Procedure: CORONARY STENT INTERVENTION;  Surgeon: [name], MD;  Location: [place] [facility];  Service: Cardiovascular;  Laterality: N/A;  . FRACTURE SURGERY    . INSERTION OF ILIAC STENT Left 11/30/2017   Left external illiac stent  . INSERTION OF ILIAC STENT  11/30/2017   Procedure: Insertion Of Iliac Stent;  Surgeon: Lorretta Harp, MD;  Location: [place] [facility];  Service: Cardiovascular;;  Left external illiac stent  . KNEE ARTHROSCOPY Left   . KNEE SURGERY     "ligaments, cartilage; tendon, put a pin in" (11/30/2017)  . LEFT HEART CATH Bilateral 07/08/2012   Procedure: LEFT HEART CATH;  Surgeon: [name], MD;  Location: [facility] CATH LAB;  Service: Cardiovascular;  Laterality: Bilateral;  . LEFT HEART CATH AND CORONARY ANGIOGRAPHY N/A 10/16/2018   Procedure: LEFT HEART CATH AND CORONARY ANGIOGRAPHY;  Surgeon: Burnell Blanks, MD;  Location: [place] [facility];  Service: Cardiovascular;  Laterality: N/A;  . LEFT HEART CATH AND CORONARY ANGIOGRAPHY N/A 03/11/2019   Procedure: LEFT HEART CATH AND CORONARY ANGIOGRAPHY;  Surgeon: [name], MD;  Location: [place] [facility];  Service: Cardiovascular;  Laterality: N/A;  . LEFT HEART CATHETERIZATION WITH CORONARY ANGIOGRAM N/A 09/06/2013   STEMI, 2nd ISR LAD. Procedure: LEFT HEART CATHETERIZATION WITH CORONARY ANGIOGRAM;  Surgeon: [name], MD;  Location: [facility] CATH LAB;  Service: Cardiovascular;  Laterality: N/A;  . LOWER EXTREMITY ANGIOGRAPHY  N/A 11/30/2017   Procedure: LOWER EXTREMITY ANGIOGRAPHY;  Surgeon: Lorretta Harp, MD;  Location: [place] [facility];  Service: Cardiovascular;  Laterality: N/A;  . PERCUTANEOUS CORONARY STENT INTERVENTION (PCI-S)  07/08/2012   Procedure: PERCUTANEOUS CORONARY STENT INTERVENTION (PCI-S);  Surgeon: [name], MD;  Location: [facility] CATH LAB;  Service: Cardiovascular;;  DES LAD  . PERCUTANEOUS CORONARY STENT INTERVENTION (PCI-S) N/A 09/06/2013   Procedure: PERCUTANEOUS CORONARY STENT INTERVENTION (PCI-S);  Surgeon: [name], MD;  Location: [facility] CATH LAB;  Service: Cardiovascular;  Laterality: N/A;  Mid LAD 3.0/24mm Promus  . WRIST FRACTURE SURGERY Bilateral        Family History  Problem Relation Age of Onset  . Hypertension Mother   . Diabetes Mother     Social History   Tobacco Use  . Smoking status: Former Smoker    Packs/day: 0.50    Years: 48.00    Pack years: 24.00    Types: Cigarettes  . Smokeless tobacco: Never Used  Substance Use Topics  . Alcohol use: Yes  Comment: 11/30/2017 "might drink a beer q 6 months"  . Drug use: No    Types: Cocaine    Comment: 11/30/2017 "last cocaine was ~ 1 wk ago"    Home Medications Prior to Admission medications   Medication Sig Start Date End Date Taking? Authorizing Provider  aspirin EC 81 MG tablet Take 1 tablet (81 mg total) by mouth daily. 10/17/18 10/17/19  Daune Perch, NP  clopidogrel (PLAVIX) 75 MG tablet Take 1 tablet (75 mg total) by mouth daily with breakfast. 02/06/19 02/01/20  Strader, Fransisco Hertz, PA-C  insulin glargine (LANTUS) 100 UNIT/ML injection Inject 30 Units into the skin at bedtime.     [provider]  losartan (COZAAR) 25 MG tablet Take 0.5 tablets (12.5 mg total) by mouth daily. 02/06/19 05/07/19  Strader, Fransisco Hertz, PA-C  metFORMIN (GLUCOPHAGE) 1000 MG tablet Take 1 tablet (1,000 mg total) by mouth 2 (two) times daily with a meal. Do not restart Metformin until Wednesday am 7/2 Patient  taking differently: Take 1,000 mg by mouth 2 (two) times daily with a meal.  07/10/12   Turner, Eber Hong, MD  metoprolol succinate (TOPROL-XL) 25 MG 24 hr tablet Take 1 tablet (25 mg total) by mouth daily. 10/18/18 10/18/19  Daune Perch, NP  nitroGLYCERIN (NITROSTAT) 0.4 MG SL tablet Place 1 tablet (0.4 mg total) under the tongue every 5 (five) minutes x 3 doses as needed for chest pain. 10/05/18   Herminio Commons, MD  pantoprazole (PROTONIX) 40 MG tablet Take 1 tablet (40 mg total) by mouth daily. 02/06/19 02/01/20  Ahmed Prima, Fransisco Hertz, PA-C  pravastatin (PRAVACHOL) 80 MG tablet Take 1 tablet (80 mg total) by mouth daily at 6 PM. 10/17/18 10/12/19  Daune Perch, NP  sildenafil (VIAGRA) 50 MG tablet Take 1 tablet (50 mg total) by mouth daily as needed for erectile dysfunction. 02/06/19   Erma Heritage, PA-C    Allergies    Patient has no known allergies.  Review of Systems   Review of Systems  Constitutional: Positive for fever.  HENT: Negative for sore throat.   Eyes: Negative for redness.  Respiratory: Negative for shortness of breath.   Cardiovascular: Negative for chest pain.  Gastrointestinal: Negative for abdominal pain.  Genitourinary: Negative for dysuria and flank pain.  Musculoskeletal: Negative for back pain and neck pain.  Skin: Negative for rash.  Neurological: Negative for headaches.  Hematological: Does not bruise/bleed easily.  Psychiatric/Behavioral: Positive for confusion.    Physical Exam Updated Vital Signs BP (!) 137/107 (BP Location: Right Arm)   Pulse (!) 103   Temp (!) 102.6 F (39.2 C) (Rectal)   Ht 1.829 m (6')   Wt 113.4 kg   SpO2 100%   BMI 33.91 kg/m   Physical Exam Vitals and nursing note reviewed.  Constitutional:      Appearance: He is well-developed.     Comments: Lethargic, unresponsive. Breathing but relatively shallow respirations.   HENT:     Head: Atraumatic.     Nose: Nose normal.     Mouth/Throat:     Mouth: Mucous  membranes are moist.     Pharynx: Oropharynx is clear.  Eyes:     General: No scleral icterus.    Conjunctiva/sclera: Conjunctivae normal.     Comments: Miotic pupils.   Neck:     Vascular: No carotid bruit.     Trachea: No tracheal deviation.     Comments: No stiffness or rigidity.  Cardiovascular:     Rate and  Rhythm: Normal rate and regular rhythm.     Pulses: Normal pulses.     Heart sounds: Normal heart sounds. No murmur. No friction rub. No gallop.   Pulmonary:     Effort: Pulmonary effort is normal. No accessory muscle usage or respiratory distress.     Breath sounds: Normal breath sounds.  Abdominal:     General: Bowel sounds are normal. There is no distension.     Palpations: Abdomen is soft.     Tenderness: There is no abdominal tenderness. There is no guarding.  Genitourinary:    Comments: No cva tenderness. Musculoskeletal:     Cervical back: Normal range of motion and neck supple. No rigidity.     Comments: Swelling, induration, and erythema to left groin/perineal region, no crepitus, no area of fluctuance.  Rectal exam, no area of fluctuance or tenderness in perianal area. Light brown stool heme neg.   Skin:    General: Skin is warm and dry.     Findings: No rash.  Neurological:     Comments: Initially, miotic pupils. Not responsive verbally. Unresponsive to stimuli.   Psychiatric:     Comments: Unresponsive.      ED Results / Procedures / Treatments   Labs (all labs ordered are listed, but only abnormal results are displayed) Results for orders placed or performed during the hospital encounter of 05/14/19  Blood Culture (routine x 2)   Specimen: BLOOD RIGHT HAND  Result Value Ref Range   Specimen Description BLOOD RIGHT HAND    Special Requests      BOTTLES DRAWN AEROBIC AND ANAEROBIC Blood Culture results may not be optimal due to an inadequate volume of blood received in culture bottles   Culture      NO GROWTH <12 HOURS Performed at [facility], [address] [place] [place] [employer], [place] [address]    Report Status PENDING   Blood Culture (routine x 2)   Specimen: BLOOD  Result Value Ref Range   Specimen Description BLOOD DRAWN BY IV THERAPY    Special Requests      BOTTLES DRAWN AEROBIC AND ANAEROBIC Blood Culture adequate volume   Culture      NO GROWTH <12 HOURS Performed at [facility], [address], [place],  [address]    Report Status PENDING   Respiratory Panel by RT PCR (Flu A&B, Covid) - Nasopharyngeal Swab   Specimen: Nasopharyngeal Swab  Result Value Ref Range   SARS Coronavirus 2 by RT PCR NEGATIVE NEGATIVE   Influenza A by PCR NEGATIVE NEGATIVE   Influenza B by PCR NEGATIVE NEGATIVE  MRSA PCR Screening   Specimen: Nasal Mucosa; Nasopharyngeal  Result Value Ref Range   MRSA by PCR NEGATIVE NEGATIVE  Lactic acid, plasma  Result Value Ref Range   Lactic Acid, Venous 6.0 (HH) 0.5 - 1.9 mmol/L  Lactic acid, plasma  Result Value Ref Range   Lactic Acid, Venous 4.3 (HH) 0.5 - 1.9 mmol/L  Comprehensive metabolic panel  Result Value Ref Range   Sodium 137 135 - 145 mmol/L   Potassium 4.1 3.5 - 5.1 mmol/L   Chloride 97 (L) 98 - 111 mmol/L   CO2 22 22 - 32 mmol/L   Glucose, Bld 246 (H) 70 - 99 mg/dL   BUN 37 (H) 8 - 23 mg/dL   Creatinine, Ser 2.95 (H) 0.61 - 1.24 mg/dL   Calcium 8.4 (L) 8.9 - 10.3 mg/dL   Total Protein 7.6 6.5 - 8.1 g/dL   Albumin 3.0 (  L) 3.5 - 5.0 g/dL   AST 24 15 - 41 U/L   ALT 19 0 - 44 U/L   Alkaline Phosphatase 77 38 - 126 U/L   Total Bilirubin 0.9 0.3 - 1.2 mg/dL   GFR calc non Af Amer 21 (L) >60 mL/min   GFR calc Af Amer 25 (L) >60 mL/min   Anion gap 18 (H) 5 - 15  Ethanol  Result Value Ref Range   Alcohol, Ethyl (B) <10 <10 mg/dL  Acetaminophen level  Result Value Ref Range   Acetaminophen (Tylenol), Serum <10 (L) 10 - 30 ug/mL  Salicylate level  Result Value Ref Range   Salicylate Lvl [id number] (L) 7.0 - 30.0 mg/dL  Protime-INR  Result Value Ref Range   Prothrombin Time 22.0  (H) 11.4 - 15.2 seconds   INR 2.0 (H) 0.8 - 1.2  CBG monitoring, ED  Result Value Ref Range   Glucose-Capillary 224 (H) 70 - 99 mg/dL  Troponin I (High Sensitivity)  Result Value Ref Range   Troponin I (High Sensitivity) 13 <18 ng/L    EKG EKG Interpretation  Date/Time:  Tuesday May 14 2019 11:53:30 EDT Ventricular Rate:  101 PR Interval:    QRS Duration: 93 QT Interval:  335 QTC Calculation: 435 R Axis:   -128 Text Interpretation: Sinus tachycardia Ventricular premature complex Nonspecific T wave abnormality Confirmed by Lajean Saver [phone]) on 05/14/2019 12:02:00 PM   Radiology CT Abdomen Pelvis Wo Contrast  Result Date: 05/14/2019 CLINICAL DATA:  Soft tissue infection suspected, pelvis. EXAM: CT ABDOMEN AND PELVIS WITHOUT CONTRAST TECHNIQUE: Multidetector CT imaging of the abdomen and pelvis was performed following the standard protocol without IV contrast. COMPARISON:  CTA of the abdomen and pelvis /5/20. CT stone study 11/24/2016. FINDINGS: Lower chest: Areas of dependent airspace disease are present in the medial lower lobes bilaterally, left greater than right. Atherosclerotic calcifications present the coronary arteries. Heart size is normal. Hepatobiliary: Gallbladder is mostly collapsed. Liver is unremarkable. Common bile duct is within normal limits. Pancreas: Unremarkable. No pancreatic ductal dilatation or surrounding inflammatory changes. Spleen: Normal in size without focal abnormality. Adrenals/Urinary Tract: Adrenal glands are normal bilaterally. There is some stranding about both kidneys. No obstruction is present. No mass lesion or stone is evident. Atherosclerotic changes are present. Ureters are within normal limits. Urinary bladder is unremarkable. Stomach/Bowel: Stomach and duodenum are within normal limits. Small bowel is unremarkable. Terminal ileum is normal. Ascending transverse colon are normal. Descending and sigmoid colon are normal. Vascular/Lymphatic: Extensive  atherosclerotic calcifications are again noted. Left iliac stent is in place. Aneurysmal dilation is present. 7 and 8 mm left inguinal lymph nodes are slightly more prominent than the prior exam. Left femoral lymph nodes have increased in size, likely reactive. Reproductive: Prostate calcifications are present without significant large mint or focal lesion. Other: Skin thickening and extensive subcutaneous edematous changes are present the medial upper left thigh extending into the inguinal region. No discrete abscess or free air is present. No superior extension is present. A well-defined subcutaneous low-density collection along the medial left gluteal cleft measures 8.3 x 7.6 x 25.6 mm. No significant stranding is associated. Musculoskeletal: Progressive disc disease is present at L5-S1 with central and bilateral foraminal stenosis. Vacuum disc is now present. Broad-based disc protrusion is also present at L3-4 and L4-5. IMPRESSION: 1. Extensive inflammatory changes the left inguinal region extending into the anterior and medial upper thigh compatible with acute cellulitis. No associated abscess is present. 2. Associated reactive adenopathy. 3.  Ascending inflammatory changes. 4. Low-density left perirectal fluid collection measuring up to 25 mm. No associated inflammatory changes are present. This may represent an early or healing perirectal abscess. The area should be amenable to direct visualization. 5. Progressive lower lumbar disc disease. Electronically Signed   By: San Morelle M.D.   On: 05/14/2019 14:29   DG Chest Port 1 View  Result Date: 05/14/2019 CLINICAL DATA:  Fever.  Overdose. EXAM: PORTABLE CHEST 1 VIEW COMPARISON:  03/10/2019. FINDINGS: Cardiomegaly. Bilateral mild interstitial prominence. Tiny bilateral pleural effusions can not be excluded. Mild CHF cannot be excluded. No pneumothorax. Degenerative change thoracic spine. IMPRESSION: Cardiomegaly. Mild bilateral interstitial  prominence. Tiny bilateral pleural effusions can not be excluded. Mild CHF cannot be excluded. Electronically Signed   By: Marcello Moores  Register   On: 05/14/2019 12:23    Procedures Procedures (including critical care time)  Medications Ordered in ED Medications  ceFEPIme (MAXIPIME) 2 g in sodium chloride 0.9 % 100 mL IVPB (has no administration in time range)  vancomycin (VANCOCIN) IVPB 1000 mg/200 mL premix (has no administration in time range)  naloxone (NARCAN) injection 0.4 mg (0.4 mg Intravenous Given 05/14/19 1204)    ED Course  I have reviewed the triage vital signs and the nursing notes.  Pertinent labs & imaging results that were available during my care of the patient were reviewed by me and considered in my medical decision making (see chart for details).    MDM Rules/Calculators/A&P                      Iv ns bolus. Stat labs. Pcxr. Ecg. Cbg.   CBG 200s.  Constricted pupils, possible overuse of ultram - narcan iv.   Reviewed nursing notes and prior charts for additional history.  Recent admission for coronary syndrome.   MDM Number of Diagnoses or Management Options AKI (acute kidney injury) [facility]): new, needed workup Cellulitis of left abdominal wall: new, needed workup Elevated lactic acid level: new, needed workup Opiate overdose, accidental or unintentional, initial encounter [facility]): new, needed workup Severe sepsis ([place]): new, needed workup   Amount and/or Complexity of Data Reviewed Clinical lab tests: ordered and reviewed Tests in the radiology section of CPT: ordered and reviewed Tests in the medicine section of CPT: ordered and reviewed Discussion of test results with the performing providers: yes Decide to obtain previous medical records or to obtain history from someone other than the patient: yes Obtain history from someone other than the patient: yes Review and summarize past medical records: yes Discuss the patient with other providers: yes Independent  visualization of images, tracings, or specimens: yes  Risk of Complications, Morbidity, and/or Mortality Presenting problems: high Diagnostic procedures: high Management options: high  Critical Care Total time providing critical care: > 105 minutes  Suspect patient with unintentional opiate overdose - responsive to narcan. Also suspect that when EMS noted 'seizure' may have been hypotension/syncope with mild stiffening/trembling, and not true seizure.   Upon receiving narcan, approximately 30-60 seconds later, pt with marked improvement in mental status and depth of breathing - pt alert, oriented, able to answer questions. Confirms only had a a total of 3 ultram tablets today. Pt notes area pain, swelling, to left groin, perineal area in the past week. Denies trauma to area.   Post cultures, iv abx given.   Additional labs reviewed/interpreted by me - lactate v high. aki on labs. 30 cc/kg ns bolus.   Recheck bp improved. Repeat lactate pending.  CXR reviewed/interpreted by me - no pna.   covid test ordered to labs.   CT reviewed/interpreted by me - left inguinal/prox thigh cellulitis. Check of anal area, no abscess seen or felt.   Recheck pt easily aroused, no resp depression, oriented.   Hospitalists consulted for admission.  CRITICAL CARE RE: severe sepsis, AKI, opiate overdose requiring iv narcan, cellulitis, AKI Performed by: Mirna Mires Total critical care time: 110 minutes Critical care time was exclusive of separately billable procedures and treating other patients. Critical care was necessary to treat or prevent imminent or life-threatening deterioration. Critical care was time spent personally by me on the following activities: development of treatment plan with patient and/or surrogate as well as nursing, discussions with consultants, evaluation of patient's response to treatment, examination of patient, obtaining history from patient or surrogate, ordering and  performing treatments and interventions, ordering and review of laboratory studies, ordering and review of radiographic studies, pulse oximetry and re-evaluation of patient's condition.  Discussed pt with critical care doc on call, Dr Melvyn Novas, who evaluated patient in ED, and indicates he feels pt appropriate for hospitalist to admit to [facility], and they can consult ICU if additional support needed.     Final Clinical Impression(s) / ED Diagnoses Final diagnoses:  None    Rx / DC Orders ED Discharge Orders    None        Lajean Saver, MD 05/14/19 1535

## 2019-05-14 NOTE — Clinical Social Work Note (Signed)
[place] emergency notification sent. Notification ID# is [id number].  Tobi Bastos, LCSW Transitions of Care Clinical Social Worker [facility] Ph: [phone]

## 2019-05-14 NOTE — ED Notes (Signed)
Pt in bed with eyes closed, pt emitting a snoring like noise, pt arouses easily to light tactile stim, pt received at total of 3500 ml of ns per md instructions.

## 2019-05-14 NOTE — ED Triage Notes (Signed)
Pt brought to ED via [facility]. EMS called out for overdose of tramadol was found in bathtub, went into generalized focal seizure with EMS was given versed 5 mg, NS 500 ml bolus, CBG 260, BP hypotensive per EMS

## 2019-05-14 NOTE — ED Notes (Addendum)
Pt in bed with eyes closed, pt has O2 sat and cardiac monitor in place, pt arouses to light tactile stimulation.  Pt answers questions after awakening, pt knows day of the per, person and place.  Pt then returns to pre-aroused state without constant verbal prompts.  Fluid bolus 1L from ems is complete, md notified of bp.

## 2019-05-14 NOTE — ED Notes (Signed)
Date and time results received: 05/14/19 1311 (use smartphrase ".now" to insert current time)  Test: Lactic Critical Value: 6.0  Name of Provider Notified: Dr. Ashok Cordia  Orders Received? Or Actions Taken?: no/na

## 2019-05-15 ENCOUNTER — Inpatient Hospital Stay (HOSPITAL_COMMUNITY): Payer: No Typology Code available for payment source

## 2019-05-15 ENCOUNTER — Inpatient Hospital Stay (HOSPITAL_COMMUNITY)
Admit: 2019-05-15 | Discharge: 2019-05-15 | Disposition: A | Discharge status: Home / Self Care | Payer: No Typology Code available for payment source | Attending: Internal Medicine | Admitting: Internal Medicine

## 2019-05-15 DIAGNOSIS — N1832 Chronic kidney disease, stage 3b: Secondary | ICD-10-CM

## 2019-05-15 DIAGNOSIS — R4182 Altered mental status, unspecified: Secondary | ICD-10-CM

## 2019-05-15 DIAGNOSIS — L03314 Cellulitis of groin: Secondary | ICD-10-CM

## 2019-05-15 DIAGNOSIS — F1410 Cocaine abuse, uncomplicated: Secondary | ICD-10-CM

## 2019-05-15 DIAGNOSIS — F17200 Nicotine dependence, unspecified, uncomplicated: Secondary | ICD-10-CM

## 2019-05-15 DIAGNOSIS — N179 Acute kidney failure, unspecified: Secondary | ICD-10-CM

## 2019-05-15 DIAGNOSIS — A419 Sepsis, unspecified organism: Secondary | ICD-10-CM

## 2019-05-15 DIAGNOSIS — I255 Ischemic cardiomyopathy: Secondary | ICD-10-CM

## 2019-05-15 DIAGNOSIS — R6520 Severe sepsis without septic shock: Secondary | ICD-10-CM

## 2019-05-15 DIAGNOSIS — I10 Essential (primary) hypertension: Secondary | ICD-10-CM

## 2019-05-15 LAB — BASIC METABOLIC PANEL
Anion gap: 9 (ref 5–15)
BUN: 31 mg/dL — ABNORMAL HIGH (ref 8–23)
CO2: 19 mmol/L — ABNORMAL LOW (ref 22–32)
Calcium: 7.7 mg/dL — ABNORMAL LOW (ref 8.9–10.3)
Chloride: 115 mmol/L — ABNORMAL HIGH (ref 98–111)
Creatinine, Ser: 2.18 mg/dL — ABNORMAL HIGH (ref 0.61–1.24)
GFR calc Af Amer: 36 mL/min — ABNORMAL LOW (ref >60)
GFR calc non Af Amer: 31 mL/min — ABNORMAL LOW (ref >60)
Glucose, Bld: 117 mg/dL — ABNORMAL HIGH (ref 70–99)
Potassium: 3.9 mmol/L (ref 3.5–5.1)
Sodium: 143 mmol/L (ref 135–145)

## 2019-05-15 LAB — CBC
HCT: 41.7 % (ref 39.0–52.0)
Hemoglobin: 12.9 g/dL — ABNORMAL LOW (ref 13.0–17.0)
MCH: 26.1 pg (ref 26.0–34.0)
MCHC: 30.9 g/dL (ref 30.0–36.0)
MCV: 84.4 fL (ref 80.0–100.0)
Platelets: 253 10*3/uL (ref 150–400)
RBC: 4.94 MIL/uL (ref 4.22–5.81)
RDW: 17.2 % — ABNORMAL HIGH (ref 11.5–15.5)
WBC: 17.1 10*3/uL — ABNORMAL HIGH (ref 4.0–10.5)
nRBC: 0 % (ref 0.0–0.2)

## 2019-05-15 LAB — GLUCOSE, CAPILLARY
Glucose-Capillary: 112 mg/dL — ABNORMAL HIGH (ref 70–99)
Glucose-Capillary: 132 mg/dL — ABNORMAL HIGH (ref 70–99)
Glucose-Capillary: 146 mg/dL — ABNORMAL HIGH (ref 70–99)
Glucose-Capillary: 227 mg/dL — ABNORMAL HIGH (ref 70–99)
Glucose-Capillary: 207 mg/dL — ABNORMAL HIGH (ref 70–99)

## 2019-05-15 LAB — URINE CULTURE: Culture: NO GROWTH

## 2019-05-15 MED ORDER — HYDROMORPHONE HCL 1 MG/ML IJ SOLN
1.0000 mg | Freq: Once | INTRAMUSCULAR | Status: AC
  Administered 2019-05-15: 1 mg via INTRAVENOUS
  Filled 2019-05-15: qty 1

## 2019-05-15 MED ORDER — CHLORHEXIDINE GLUCONATE CLOTH 2 % EX PADS
6.0000 | MEDICATED_PAD | Freq: Every day | CUTANEOUS | Status: DC
  Administered 2019-05-15 – 2019-05-27 (×10): 6 via TOPICAL

## 2019-05-15 MED ORDER — SODIUM CHLORIDE 0.9 % IV SOLN: INTRAVENOUS | Status: DC

## 2019-05-15 MED ORDER — FENTANYL CITRATE (PF) 100 MCG/2ML IJ SOLN
25.0000 ug | Freq: Once | INTRAMUSCULAR | Status: AC
  Administered 2019-05-15: 25 ug via INTRAVENOUS
  Filled 2019-05-15: qty 2

## 2019-05-15 NOTE — Procedures (Signed)
Patient Name: [name]  MRN: [id number]  Epilepsy Attending: Lora Havens  Referring Physician/Provider: Dr. [name] Tat Date: 05/15/2019 Duration: 24.03 mins  Patient history: 64 year old male with altered mental status.  EEG evaluate for seizures.  Level of alertness: Awake, sleep  AEDs during EEG study: None  Technical aspects: This EEG study was done with scalp electrodes positioned according to the 10-20 International system of electrode placement. Electrical activity was acquired at a sampling rate of 500Hz  and reviewed with a high frequency filter of 70Hz  and a low frequency filter of 1Hz . EEG data were recorded continuously and digitally stored.   Description: The posterior dominant rhythm consists of 8 Hz activity of moderate voltage (25-35 uV) seen predominantly in posterior head regions, symmetric and reactive to eye opening and eye closing.  Sleep was characterized by vertex waves, sleep spindles (12 to 14 Hz), maximal frontocentral region.  Intermittent generalized 3 to 5 Hz theta-delta slowing was also noted.  Hyperventilation and photic stimulation were not performed.  Abnormality -Intermittent slow, generalized  IMPRESSION: This study is suggestive of mild diffuse encephalopathy, nonspecific to etiology. No seizures or epileptiform discharges were seen throughout the recording.  [name] [name]

## 2019-05-15 NOTE — Progress Notes (Addendum)
PROGRESS NOTE  [name] [id number] DOB: 1955/12/25 DOA: 05/14/2019 PCP: System, Pcp Not In  Brief History:  64 year old male with a history of coronary artery disease and recent NSTEMI, diabetes mellitus type 2, systolic CHF, CKD stage III, hypertension, cocaine abuse, peripheral vascular disease, apical mural thrombus presenting with altered mental status and generalized weakness.  Apparently, the patient was in the bathtub trying to soak his left groin area that has been bothering him for the past 2 weeks.  He had difficulty getting out of the tub because of generalized weakness.  His girlfriend also noted him to be lethargic.  Upon EMS arrival, there was concern of seizure activity and the patient was given Versed and 500 mL fluid bolus.  He was somnolent at the time.  In the emergency department, the patient was awakening to voice, but drifted back to sleep.  Apparently the patient took 3 doses of tramadol because of his left groin pain.  He had denied any chest pain, shortness of breath, nausea, vomiting, diarrhea, rectal pain.  There is no dysuria or hematuria.  Recent hospitalization 2/28 - 03/12/19 under cardiology service for NSTEMI, troponin peaking at 575, requiring PCI of RCA/PDA, via cardiac cath 03/11/19.  On dual antiplatelets Plavix and aspirin.  Echocardiogram also showed apical filling defect consistent with thrombus/early thrombus formation.  Plan was to continue aspirin and Plavix for 30 days, to discontinue aspirin then and add Xarelto and continue Plavix long-term. Echo showed EF of 30 to 35%, due to history of intermittent compliance, was felt not to be a candidate for ICD.  Unless he could demonstrate compliance with medications and follow-up in the future.  On the morning of 05/15/2019, the patient was alert and oriented x3.  He continued to complain of pain in his left groin.  He continued to have soft blood pressures in the  ICU.  Assessment/Plan: Sepsis -Secondary to left groin/inguinal cellulitis -05/14/2019 CT abdomen/pelvis--extensive inflammatory changes in the left inguinal region extending into the anterior and medial upper thigh.  There is no drainable abscess.  There is a left perirectal fluid collection without inflammatory changes. -Continue vancomycin and cefepime -Follow blood cultures -UA negative for pyuria  Left groin cellulitis -Continue vancomycin and cefepime pending culture data  Perirectal fluid collection -General surgery consult  Acute on chronic renal failure--CKD stage III -Baseline creatinine 1.4-1.6 -Presented with serum creatinine 2.95 -Secondary to sepsis and hemodynamic changes -Continue IV fluids  Acute metabolic encephalopathy -Concerned about possible seizure -Obtain EEG -MRI brain -05/15/2019 CT brain--age-indeterminate hypodensity right posterior occipital lobe  Acute respiratory failure with hypoxia -Likely secondary to hypoventilation -Currently stable on room air  Chronic systolic and diastolic CHF -Appears clinically euvolemic -03/11/2019 echo EF 30-35%, grade 1 DD  Coronary artery disease -Patient had recent NSTEMI -Continue aspirin and Plavix -No chest pain presently -Personally reviewed EKG--sinus rhythm, nonspecific T wave change  Cocaine and Tobacco Abuse -cessation discussed       Disposition Plan: Patient From: Home D/C Place: Home - 2-3  Days Barriers: Not Clinically Stable--remains on IV antibiotics with sepsis and soft BPs and AKI  Family Communication:   Family at bedside  Consultants:  none  Code Status:  FULL   DVT Prophylaxis:  [place] Heparin   Procedures: As Listed in Progress Note Above  Antibiotics: vanco 5/4>>> Cefepime 5/4>>>     Subjective: Complains of left groin pain.  He denies any chest pain, shortness breath, coughing, hemoptysis.  There is no rectal pain.  He complains of left groin pain.  There is no  dysuria, hematuria.  There is no abdominal pain.  Objective: Vitals:   05/15/19 0515 05/15/19 0530 05/15/19 0545 05/15/19 0600  BP: 97/62 (!) 88/61 92/65 (!) 83/61  Pulse: (!) 104 (!) 101 99 98  Resp: 19 15 18  (!) 30  Temp:      TempSrc:      SpO2: 92% 94% 94% 96%  Weight:      Height:        Intake/Output Summary (Last 24 hours) at 05/15/2019 [id number] Last data filed at 05/15/2019 0615 Gross per 24 hour  Intake 5327.79 ml  Output 1340 ml  Net 3987.79 ml   Weight change:  Exam:   General:  Pt is alert, follows commands appropriately, not in acute distress  HEENT: No icterus, No thrush, No neck mass, [place]/AT  Cardiovascular: RRR, S1/S2, no rubs, no gallops  Respiratory: Fine bibasilar crackles but no wheezing  Abdomen: Soft/+BS, non tender, non distended, no guarding  Extremities: No edema, No lymphangitis, No petechiae, No rashes, no synovitis; erythema and induration of the left groin without crepitance.  There is no visible necrosis.   Data Reviewed: I have personally reviewed following labs and imaging studies Basic Metabolic Panel: Recent Labs  Lab 05/14/19 1204 05/15/19 0505  NA 137 143  K 4.1 3.9  CL 97* 115*  CO2 22 19*  GLUCOSE 246* 117*  BUN 37* 31*  CREATININE 2.95* 2.18*  CALCIUM 8.4* 7.7*   Liver Function Tests: Recent Labs  Lab 05/14/19 1204  AST 24  ALT 19  ALKPHOS 77  BILITOT 0.9  PROT 7.6  ALBUMIN 3.0*   No results for input(s): LIPASE, AMYLASE in the last 168 hours. No results for input(s): AMMONIA in the last 168 hours. Coagulation Profile: Recent Labs  Lab 05/14/19 1242  INR 2.0*   CBC: Recent Labs  Lab 05/14/19 1204 05/15/19 0505  WBC 21.7* 17.1*  NEUTROABS 18.5*  --   HGB 13.5 12.9*  HCT 44.2 41.7  MCV 85.7 84.4  PLT 290 253   Cardiac Enzymes: No results for input(s): CKTOTAL, CKMB, CKMBINDEX, TROPONINI in the last 168 hours. BNP: Invalid input(s): POCBNP CBG: Recent Labs  Lab 05/14/19 1207 05/14/19 1959  05/14/19 2352 05/15/19 0435 05/15/19 0727  GLUCAP 224* 108* 94 112* 132*   HbA1C: No results for input(s): HGBA1C in the last 72 hours. Urine analysis:    Component Value Date/Time   COLORURINE YELLOW 05/14/2019 1157   APPEARANCEUR HAZY (A) 05/14/2019 1157   LABSPEC 1.013 05/14/2019 1157   PHURINE 5.0 05/14/2019 1157   GLUCOSEU >=500 (A) 05/14/2019 1157   HGBUR MODERATE (A) 05/14/2019 1157   BILIRUBINUR NEGATIVE 05/14/2019 1157   KETONESUR NEGATIVE 05/14/2019 1157   PROTEINUR 30 (A) 05/14/2019 1157   UROBILINOGEN 0.2 09/27/2014 2237   NITRITE NEGATIVE 05/14/2019 1157   LEUKOCYTESUR NEGATIVE 05/14/2019 1157   Sepsis Labs: @LABRCNTIP (procalcitonin:4,lacticidven:4) ) Recent Results (from the past 240 hour(s))  Respiratory Panel by RT PCR (Flu A&B, Covid) - Nasopharyngeal Swab     Status: None   Collection Time: 05/14/19 12:00 PM   Specimen: Nasopharyngeal Swab  Result Value Ref Range Status   SARS Coronavirus 2 by RT PCR NEGATIVE NEGATIVE Final    Comment: (NOTE) SARS-CoV-2 target nucleic acids are NOT DETECTED. The SARS-CoV-2 RNA is generally detectable in upper respiratoy specimens during the acute phase of infection. The lowest concentration of SARS-CoV-2 viral copies this assay  can detect is 131 copies/mL. A negative result does not preclude SARS-Cov-2 infection and should not be used as the sole basis for treatment or other patient management decisions. A negative result may occur with  improper specimen collection/handling, submission of specimen other than nasopharyngeal swab, presence of viral mutation(s) within the areas targeted by this assay, and inadequate number of viral copies (<131 copies/mL). A negative result must be combined with clinical observations, patient history, and epidemiological information. The expected result is Negative. Fact Sheet for Patients:  PinkCheek.be Fact Sheet for Healthcare Providers:   GravelBags.it This test is not yet ap proved or cleared by the [place] FDA and  has been authorized for detection and/or diagnosis of SARS-CoV-2 by FDA under an Emergency Use Authorization (EUA). This EUA will remain  in effect (meaning this test can be used) for the duration of the COVID-19 declaration under Section 564(b)(1) of the Act, 21 U.S.C. section 360bbb-3(b)(1), unless the authorization is terminated or revoked sooner.    Influenza A by PCR NEGATIVE NEGATIVE Final   Influenza B by PCR NEGATIVE NEGATIVE Final    Comment: (NOTE) The Xpert Xpress SARS-CoV-2/FLU/RSV assay is intended as an aid in  the diagnosis of influenza from Nasopharyngeal swab specimens and  should not be used as a sole basis for treatment. Nasal washings and  aspirates are unacceptable for Xpert Xpress SARS-CoV-2/FLU/RSV  testing. Fact Sheet for Patients: PinkCheek.be Fact Sheet for Healthcare Providers: GravelBags.it This test is not yet approved or cleared by the [place] FDA and  has been authorized for detection and/or diagnosis of SARS-CoV-2 by  FDA under an Emergency Use Authorization (EUA). This EUA will remain  in effect (meaning this test can be used) for the duration of the  Covid-19 declaration under Section 564(b)(1) of the Act, 21  U.S.C. section 360bbb-3(b)(1), unless the authorization is  terminated or revoked. Performed at [facility], [address], [place], [place] [address]   Blood Culture (routine x 2)     Status: None (Preliminary result)   Collection Time: 05/14/19 12:12 PM   Specimen: BLOOD  Result Value Ref Range Status   Specimen Description BLOOD DRAWN BY IV THERAPY  Final   Special Requests   Final    BOTTLES DRAWN AEROBIC AND ANAEROBIC Blood Culture adequate volume   Culture   Final    NO GROWTH < 24 HOURS Performed at [facility], [address] [place],   [address]    Report Status PENDING  Incomplete  Blood Culture (routine x 2)     Status: None (Preliminary result)   Collection Time: 05/14/19 12:20 PM   Specimen: BLOOD RIGHT HAND  Result Value Ref Range Status   Specimen Description BLOOD RIGHT HAND  Final   Special Requests   Final    BOTTLES DRAWN AEROBIC AND ANAEROBIC Blood Culture results may not be optimal due to an inadequate volume of blood received in culture bottles   Culture   Final    NO GROWTH < 24 HOURS Performed at [facility], [address] [place],  [address]    Report Status PENDING  Incomplete  MRSA PCR Screening     Status: None   Collection Time: 05/14/19 12:40 PM   Specimen: Nasal Mucosa; Nasopharyngeal  Result Value Ref Range Status   MRSA by PCR NEGATIVE NEGATIVE Final    Comment:        The GeneXpert MRSA Assay (FDA approved for NASAL specimens only), is one component of a  comprehensive MRSA colonization surveillance program. It is not intended to diagnose MRSA infection nor to guide or monitor treatment for MRSA infections. Performed at [facility], [address], [place], [place] [address]      Scheduled Meds: . aspirin EC  81 mg Oral Daily  . Chlorhexidine Gluconate Cloth  6 each Topical Daily  . clopidogrel  75 mg Oral Q breakfast  . heparin  5,000 Units Subcutaneous Q8H  . insulin aspart  0-15 Units Subcutaneous Q4H   Continuous Infusions: . sodium chloride 75 mL/hr at 05/15/19 0615  . ceFEPime (MAXIPIME) IV Stopped (05/15/19 0016)  . vancomycin      Procedures/Studies: CT Abdomen Pelvis Wo Contrast  Result Date: 05/14/2019 CLINICAL DATA:  Soft tissue infection suspected, pelvis. EXAM: CT ABDOMEN AND PELVIS WITHOUT CONTRAST TECHNIQUE: Multidetector CT imaging of the abdomen and pelvis was performed following the standard protocol without IV contrast. COMPARISON:  CTA of the abdomen and pelvis /5/20. CT stone study 11/24/2016. FINDINGS: Lower chest: Areas of dependent airspace  disease are present in the medial lower lobes bilaterally, left greater than right. Atherosclerotic calcifications present the coronary arteries. Heart size is normal. Hepatobiliary: Gallbladder is mostly collapsed. Liver is unremarkable. Common bile duct is within normal limits. Pancreas: Unremarkable. No pancreatic ductal dilatation or surrounding inflammatory changes. Spleen: Normal in size without focal abnormality. Adrenals/Urinary Tract: Adrenal glands are normal bilaterally. There is some stranding about both kidneys. No obstruction is present. No mass lesion or stone is evident. Atherosclerotic changes are present. Ureters are within normal limits. Urinary bladder is unremarkable. Stomach/Bowel: Stomach and duodenum are within normal limits. Small bowel is unremarkable. Terminal ileum is normal. Ascending transverse colon are normal. Descending and sigmoid colon are normal. Vascular/Lymphatic: Extensive atherosclerotic calcifications are again noted. Left iliac stent is in place. Aneurysmal dilation is present. 7 and 8 mm left inguinal lymph nodes are slightly more prominent than the prior exam. Left femoral lymph nodes have increased in size, likely reactive. Reproductive: Prostate calcifications are present without significant large mint or focal lesion. Other: Skin thickening and extensive subcutaneous edematous changes are present the medial upper left thigh extending into the inguinal region. No discrete abscess or free air is present. No superior extension is present. A well-defined subcutaneous low-density collection along the medial left gluteal cleft measures 8.3 x 7.6 x 25.6 mm. No significant stranding is associated. Musculoskeletal: Progressive disc disease is present at L5-S1 with central and bilateral foraminal stenosis. Vacuum disc is now present. Broad-based disc protrusion is also present at L3-4 and L4-5. IMPRESSION: 1. Extensive inflammatory changes the left inguinal region extending into  the anterior and medial upper thigh compatible with acute cellulitis. No associated abscess is present. 2. Associated reactive adenopathy. 3. Ascending inflammatory changes. 4. Low-density left perirectal fluid collection measuring up to 25 mm. No associated inflammatory changes are present. This may represent an early or healing perirectal abscess. The area should be amenable to direct visualization. 5. Progressive lower lumbar disc disease. Electronically Signed   By: San Morelle M.D.   On: 05/14/2019 14:29   CT HEAD WO CONTRAST  Result Date: 05/14/2019 CLINICAL DATA:  Encephalopathy, seizures EXAM: CT HEAD WITHOUT CONTRAST TECHNIQUE: Contiguous axial images were obtained from the base of the skull through the vertex without intravenous contrast. COMPARISON:  10/15/2018 FINDINGS: Brain: There is new, age-indeterminate cortical based hypodensity of the posterior right occipital lobe (series 2, image 13). Extensive periventricular and deep white matter hypodensity. Vascular: No hyperdense vessel or unexpected calcification. Skull:  Normal. Negative for fracture or focal lesion. Sinuses/Orbits: No acute finding. Other: None. IMPRESSION: 1. There is new cortical based hypodensity of the posterior right occipital lobe, consistent with infarction although age indeterminate, by appearance subacute to chronic. Consider MRI to further evaluate for acute diffusion restricting infarction. 2.  Small-vessel white matter disease. Electronically Signed   By: Eddie Candle M.D.   On: 05/14/2019 17:45   DG Chest Port 1 View  Result Date: 05/14/2019 CLINICAL DATA:  Fever.  Overdose. EXAM: PORTABLE CHEST 1 VIEW COMPARISON:  03/10/2019. FINDINGS: Cardiomegaly. Bilateral mild interstitial prominence. Tiny bilateral pleural effusions can not be excluded. Mild CHF cannot be excluded. No pneumothorax. Degenerative change thoracic spine. IMPRESSION: Cardiomegaly. Mild bilateral interstitial prominence. Tiny bilateral pleural  effusions can not be excluded. Mild CHF cannot be excluded. Electronically Signed   By: Marcello Moores  Register   On: 05/14/2019 12:23    Orson Eva, DO  [facility]  If 7PM-7AM, please contact night-coverage [email or web address] Password [email or web address] 05/15/2019, 8:12 AM   LOS: 1 day

## 2019-05-15 NOTE — Progress Notes (Signed)
Patient refusing midnight CBG check.

## 2019-05-15 NOTE — Progress Notes (Signed)
Pt with temp 102.5. MD made aware. New orders received will continue to monitor.

## 2019-05-15 NOTE — Progress Notes (Signed)
EEG completed, results pending. 

## 2019-05-15 NOTE — Consult Note (Addendum)
[facility]  Reason for Consult: Left groin/ left gluteal /perirectal fluid collection Referring Physician:  Dr. Carles Collet  Chief Complaint    Drug Overdose      HPI: [name] is a 64 y.o. male with multiple medical issues including CAD, CHF, CKD 3, HTN, Diabetes, cocaine use, and recent NSTEMI who presented to the hospital unresponsive after taking tramadol for pain and found in the bathtub where he was soaking to help his groin pain. His family has reported a "boil " in the area and worsening redness in the left groin over the last 2 weeks. He was evaluated in the ED and found to have extensive cellulitis on non contrast CT and was febrile to 102 with acute kidney injury and severe sepsis. He was brought into the hospital for further care.   I was asked to see him do to the question of a small 24mm size collection in the left gluteal cleft region on CT.   The patient is awake and answering questions. He is oriented to self and hospital but thinks he is in [place]. He says his leg and groin are very tender.   Past Medical History:  Diagnosis Date  . Bulging lumbar disc   . CAD (coronary artery disease) [id number]   a. prior LAD stenting. b. s/p DES to [facility] 08/2015 @ [facility]. c. 04/2016 Cardiac cath at [facility]. # vessel CAD, with patent stent in the PLAD and RCA. Diffuse dLAD, OM2, and  RPDA disease. No aortic stenosis. Elevated LVEDP. No lesion to suggest ACS. Continue medical managment.  . Chronic lower back pain   . CKD (chronic kidney disease), stage II   . Cocaine abuse ([place])   . DM2 (diabetes mellitus, type 2) ([place])   . GERD (gastroesophageal reflux disease)   . Headache    "often; not regular" (11/30/2017)  . HTN (hypertension)   . Hyperlipidemia   . Ischemic cardiomyopathy   . MI, old   . Obesity   . Pneumonia    hx  . Sleep apnea    "have machine; I don't use it" (11/30/2017)  . Tobacco use     Past Surgical History:  Procedure Laterality  Date  . APPENDECTOMY    . CARDIAC CATHETERIZATION N/A 09/07/2015   Procedure: Left Heart Cath and Coronary Angiography;  Surgeon: Leonie Man, MD;  Location: [place] [facility];  Service: Cardiovascular;  Laterality: N/A;  . CARDIAC CATHETERIZATION N/A 09/07/2015   Procedure: Coronary Stent Intervention;  Surgeon: Leonie Man, MD;  Location: [place] [facility];  Service: Cardiovascular;  Laterality: N/A;  . CORONARY ANGIOGRAM  09/07/13   residual RCA and OM disease  . CORONARY ANGIOPLASTY WITH STENT PLACEMENT    . CORONARY STENT INTERVENTION N/A 10/16/2018   Procedure: CORONARY STENT INTERVENTION;  Surgeon: Burnell Blanks, MD;  Location: [place] [facility];  Service: Cardiovascular;  Laterality: N/A;  . CORONARY STENT INTERVENTION N/A 03/11/2019   Procedure: CORONARY STENT INTERVENTION;  Surgeon: [name], MD;  Location: [place] [facility];  Service: Cardiovascular;  Laterality: N/A;  . FRACTURE SURGERY    . INSERTION OF ILIAC STENT Left 11/30/2017   Left external illiac stent  . INSERTION OF ILIAC STENT  11/30/2017   Procedure: Insertion Of Iliac Stent;  Surgeon: Lorretta Harp, MD;  Location: [place] [facility];  Service: Cardiovascular;;  Left external illiac stent  . KNEE ARTHROSCOPY Left   . KNEE SURGERY     "  ligaments, cartilage; tendon, put a pin in" (11/30/2017)  . LEFT HEART CATH Bilateral 07/08/2012   Procedure: LEFT HEART CATH;  Surgeon: [name], MD;  Location: [facility] CATH LAB;  Service: Cardiovascular;  Laterality: Bilateral;  . LEFT HEART CATH AND CORONARY ANGIOGRAPHY N/A 10/16/2018   Procedure: LEFT HEART CATH AND CORONARY ANGIOGRAPHY;  Surgeon: Burnell Blanks, MD;  Location: [place] [facility];  Service: Cardiovascular;  Laterality: N/A;  . LEFT HEART CATH AND CORONARY ANGIOGRAPHY N/A 03/11/2019   Procedure: LEFT HEART CATH AND CORONARY ANGIOGRAPHY;  Surgeon: [name], MD;  Location: [place] [facility];  Service:  Cardiovascular;  Laterality: N/A;  . LEFT HEART CATHETERIZATION WITH CORONARY ANGIOGRAM N/A 09/06/2013   STEMI, 2nd ISR LAD. Procedure: LEFT HEART CATHETERIZATION WITH CORONARY ANGIOGRAM;  Surgeon: [name], MD;  Location: [facility] CATH LAB;  Service: Cardiovascular;  Laterality: N/A;  . LOWER EXTREMITY ANGIOGRAPHY N/A 11/30/2017   Procedure: LOWER EXTREMITY ANGIOGRAPHY;  Surgeon: Lorretta Harp, MD;  Location: [place] [facility];  Service: Cardiovascular;  Laterality: N/A;  . PERCUTANEOUS CORONARY STENT INTERVENTION (PCI-S)  07/08/2012   Procedure: PERCUTANEOUS CORONARY STENT INTERVENTION (PCI-S);  Surgeon: [name], MD;  Location: [facility] CATH LAB;  Service: Cardiovascular;;  DES LAD  . PERCUTANEOUS CORONARY STENT INTERVENTION (PCI-S) N/A 09/06/2013   Procedure: PERCUTANEOUS CORONARY STENT INTERVENTION (PCI-S);  Surgeon: [name], MD;  Location: [facility] CATH LAB;  Service: Cardiovascular;  Laterality: N/A;  Mid LAD 3.0/24mm Promus  . WRIST FRACTURE SURGERY Bilateral     Family History  Problem Relation Age of Onset  . Hypertension Mother   . Diabetes Mother     Social History   Tobacco Use  . Smoking status: Former Smoker    Packs/day: 0.50    Years: 48.00    Pack years: 24.00    Types: Cigarettes  . Smokeless tobacco: Never Used  Substance Use Topics  . Alcohol use: Yes    Comment: 11/30/2017 "might drink a beer q 6 months"  . Drug use: No    Types: Cocaine    Comment: 11/30/2017 "last cocaine was ~ 1 wk ago"    Medications:  I have reviewed the patient's current medications. Prior to Admission:  Medications Prior to Admission  Medication Sig Dispense Refill Last Dose  . pravastatin (PRAVACHOL) 80 MG tablet Take 1 tablet (80 mg total) by mouth daily at 6 PM. 90 tablet 3   . aspirin EC 81 MG tablet Take 1 tablet (81 mg total) by mouth daily. 90 tablet 3   . clopidogrel (PLAVIX) 75 MG tablet Take 1 tablet (75 mg total) by mouth daily with breakfast. 90  tablet 3   . insulin glargine (LANTUS) 100 UNIT/ML injection Inject 35 Units into the skin at bedtime.      Marland Kitchen losartan (COZAAR) 25 MG tablet Take 0.5 tablets (12.5 mg total) by mouth daily. 90 tablet 3   . metFORMIN (GLUCOPHAGE) 1000 MG tablet Take 1 tablet (1,000 mg total) by mouth 2 (two) times daily with a meal. Do not restart Metformin until Wednesday am 7/2 (Patient taking differently: Take 1,000 mg by mouth 2 (two) times daily with a meal. )     . metoprolol succinate (TOPROL-XL) 25 MG 24 hr tablet Take 1 tablet (25 mg total) by mouth daily. 90 tablet 3   . nitroGLYCERIN (NITROSTAT) 0.4 MG SL tablet Place 1 tablet (0.4 mg total) under the tongue every 5 (five) minutes x 3 doses as  needed for chest pain. 25 tablet 3   . pantoprazole (PROTONIX) 40 MG tablet Take 1 tablet (40 mg total) by mouth daily. 90 tablet 3   . sildenafil (VIAGRA) 50 MG tablet Take 1 tablet (50 mg total) by mouth daily as needed for erectile dysfunction. 10 tablet 0    Scheduled: . aspirin EC  81 mg Oral Daily  . Chlorhexidine Gluconate Cloth  6 each Topical Daily  . clopidogrel  75 mg Oral Q breakfast  . heparin  5,000 Units Subcutaneous Q8H  . insulin aspart  0-15 Units Subcutaneous Q4H   Continuous: . ceFEPime (MAXIPIME) IV 2 g (05/15/19 1241)  . vancomycin     [id number] **OR** acetaminophen, ondansetron **OR** ondansetron (ZOFRAN) IV  No Known Allergies   ROS:  A comprehensive review of systems was negative except for: Constitutional: positive for chills and fevers Musculoskeletal: positive for left groin/ leg pain  Blood pressure 105/85, pulse (!) 101, temperature (!) 97.5 F (36.4 C), temperature source Oral, resp. rate 18, height 5\' 9"  (1.753 m), weight 99.9 kg, SpO2 (!) 89 %. Physical Exam Vitals reviewed.  Constitutional:      Appearance: Normal appearance.  HENT:     Head: Normocephalic.     Nose: Nose normal.     Mouth/Throat:     Mouth: Mucous membranes are moist.  Eyes:      Pupils: Pupils are equal, round, and reactive to light.  Cardiovascular:     Rate and Rhythm: Normal rate.  Pulmonary:     Effort: Pulmonary effort is normal.  Abdominal:     General: There is no distension.     Palpations: Abdomen is soft.     Tenderness: There is no abdominal tenderness.     Comments: Left inguinal region erythema extending from pubis to hip and down on to the mid thigh, tender out of proportion,   Genitourinary:    Penis: Normal.      Comments: No tenderness, drainage, palpable areas or erythema around on the anus or the left gluteal cleft corresponding to CT findings Musculoskeletal:        General: Normal range of motion.     Cervical back: Normal range of motion.  Skin:    General: Skin is warm and dry.  Neurological:     General: No focal deficit present.     Mental Status: He is alert.     Comments: Oriented to self, hospital, but thinks he is in [place]  Psychiatric:        Mood and Affect: Mood normal.        Behavior: Behavior normal.        Thought Content: Thought content normal.        Judgment: Judgment normal.         Results: Results for orders placed or performed during the hospital encounter of 05/14/19 (from the past 48 hour(s))  Urinalysis, Routine w reflex microscopic     Status: Abnormal   Collection Time: 05/14/19 11:57 AM  Result Value Ref Range   Color, Urine YELLOW YELLOW   APPearance HAZY (A) CLEAR   Specific Gravity, Urine 1.013 1.005 - 1.030   pH 5.0 5.0 - 8.0   Glucose, UA >=500 (A) NEGATIVE mg/dL   Hgb urine dipstick MODERATE (A) NEGATIVE   Bilirubin Urine NEGATIVE NEGATIVE   Ketones, ur NEGATIVE NEGATIVE mg/dL   Protein, ur 30 (A) NEGATIVE mg/dL   Nitrite NEGATIVE NEGATIVE   Leukocytes,Ua NEGATIVE NEGATIVE   RBC /  HPF 0-5 0 - 5 RBC/hpf   WBC, UA 0-5 0 - 5 WBC/hpf   Bacteria, UA RARE (A) NONE SEEN   Squamous Epithelial / LPF 0-5 0 - 5   Hyaline Casts, UA PRESENT     Comment: Performed at [facility], [address], [place], [place] [address]  Rapid urine drug screen (hospital performed)     Status: Abnormal   Collection Time: 05/14/19 11:59 AM  Result Value Ref Range   Opiates NONE DETECTED NONE DETECTED   Cocaine POSITIVE (A) NONE DETECTED   Benzodiazepines POSITIVE (A) NONE DETECTED   Amphetamines NONE DETECTED NONE DETECTED   Tetrahydrocannabinol NONE DETECTED NONE DETECTED   Barbiturates NONE DETECTED NONE DETECTED    Comment: (NOTE) DRUG SCREEN FOR MEDICAL PURPOSES ONLY.  IF CONFIRMATION IS NEEDED FOR ANY PURPOSE, NOTIFY LAB WITHIN 5 DAYS. LOWEST DETECTABLE LIMITS FOR URINE DRUG SCREEN Drug Class                     Cutoff (ng/mL) Amphetamine and metabolites    1000 Barbiturate and metabolites    200 Benzodiazepine                 [id number] Tricyclics and metabolites     300 Opiates and metabolites        300 Cocaine and metabolites        300 THC                            50 Performed at [facility], [address] [place], [place] [address]   Respiratory Panel by RT PCR (Flu A&B, Covid) - Nasopharyngeal Swab     Status: None   Collection Time: 05/14/19 12:00 PM   Specimen: Nasopharyngeal Swab  Result Value Ref Range   SARS Coronavirus 2 by RT PCR NEGATIVE NEGATIVE    Comment: (NOTE) SARS-CoV-2 target nucleic acids are NOT DETECTED. The SARS-CoV-2 RNA is generally detectable in upper respiratoy specimens during the acute phase of infection. The lowest concentration of SARS-CoV-2 viral copies this assay can detect is 131 copies/mL. A negative result does not preclude SARS-Cov-2 infection and should not be used as the sole basis for treatment or other patient management decisions. A negative result may occur with  improper specimen collection/handling, submission of specimen other than nasopharyngeal swab, presence of viral mutation(s) within the areas targeted by this assay, and inadequate number of viral copies (<131 copies/mL). A negative result must be combined with  clinical observations, patient history, and epidemiological information. The expected result is Negative. Fact Sheet for Patients:  PinkCheek.be Fact Sheet for Healthcare Providers:  GravelBags.it This test is not yet ap proved or cleared by the [place] FDA and  has been authorized for detection and/or diagnosis of SARS-CoV-2 by FDA under an Emergency Use Authorization (EUA). This EUA will remain  in effect (meaning this test can be used) for the duration of the COVID-19 declaration under Section 564(b)(1) of the Act, 21 U.S.C. section 360bbb-3(b)(1), unless the authorization is terminated or revoked sooner.    Influenza A by PCR NEGATIVE NEGATIVE   Influenza B by PCR NEGATIVE NEGATIVE    Comment: (NOTE) The Xpert Xpress SARS-CoV-2/FLU/RSV assay is intended as an aid in  the diagnosis of influenza from Nasopharyngeal swab specimens and  should not be used as a sole basis for treatment. Nasal washings and  aspirates are unacceptable for Xpert Xpress SARS-CoV-2/FLU/RSV  testing. Fact  Sheet for Patients: PinkCheek.be Fact Sheet for Healthcare Providers: GravelBags.it This test is not yet approved or cleared by the [place] FDA and  has been authorized for detection and/or diagnosis of SARS-CoV-2 by  FDA under an Emergency Use Authorization (EUA). This EUA will remain  in effect (meaning this test can be used) for the duration of the  Covid-19 declaration under Section 564(b)(1) of the Act, 21  U.S.C. section 360bbb-3(b)(1), unless the authorization is  terminated or revoked. Performed at [facility], [address] [place], [place] [address]   Lactic acid, plasma     Status: Abnormal   Collection Time: 05/14/19 12:04 PM  Result Value Ref Range   Lactic Acid, Venous 6.0 (HH) 0.5 - 1.9 mmol/L    Comment: CRITICAL RESULT CALLED TO, READ BACK BY AND VERIFIED  WITH: TALBET,T AT 1311 ON 5.4.21 BY ISLEY,B Performed at [address] [place], [place] [address]   Comprehensive metabolic panel     Status: Abnormal   Collection Time: 05/14/19 12:04 PM  Result Value Ref Range   Sodium 137 135 - 145 mmol/L   Potassium 4.1 3.5 - 5.1 mmol/L   Chloride 97 (L) 98 - 111 mmol/L   CO2 22 22 - 32 mmol/L   Glucose, Bld 246 (H) 70 - 99 mg/dL    Comment: Glucose reference range applies only to samples taken after fasting for at least 8 hours.   BUN 37 (H) 8 - 23 mg/dL   Creatinine, Ser 2.95 (H) 0.61 - 1.24 mg/dL   Calcium 8.4 (L) 8.9 - 10.3 mg/dL   Total Protein 7.6 6.5 - 8.1 g/dL   Albumin 3.0 (L) 3.5 - 5.0 g/dL   AST 24 15 - 41 U/L   ALT 19 0 - 44 U/L   Alkaline Phosphatase 77 38 - 126 U/L   Total Bilirubin 0.9 0.3 - 1.2 mg/dL   GFR calc non Af Amer 21 (L) >60 mL/min   GFR calc Af Amer 25 (L) >60 mL/min   Anion gap 18 (H) 5 - 15    Comment: Performed at [facility], [address] [place], [place] [address]  Ethanol     Status: None   Collection Time: 05/14/19 12:04 PM  Result Value Ref Range   Alcohol, Ethyl (B) <10 <10 mg/dL    Comment: (NOTE) Lowest detectable limit for serum alcohol is 10 mg/dL. For medical purposes only. Performed at [facility], [address], [place], [place] [address]   Troponin I (High Sensitivity)     Status: None   Collection Time: 05/14/19 12:04 PM  Result Value Ref Range   Troponin I (High Sensitivity) 13 <18 ng/L    Comment: (NOTE) Elevated high sensitivity troponin I (hsTnI) values and significant  changes across serial measurements may suggest ACS but many other  chronic and acute conditions are known to elevate hsTnI results.  Refer to the "Links" section for chest pain algorithms and additional  guidance. Performed at [facility], [address], [place], [place] [address]   Acetaminophen level     Status: Abnormal   Collection Time: 05/14/19 12:04 PM  Result Value Ref Range    Acetaminophen (Tylenol), Serum <10 (L) 10 - 30 ug/mL    Comment: (NOTE) Therapeutic concentrations vary significantly. A range of 10-30 ug/mL  may be an effective concentration for many patients. However, some  are best treated at concentrations outside of this range. Acetaminophen concentrations >150 ug/mL at 4 hours after ingestion  and >50 ug/mL at 12 hours after ingestion are often associated with  toxic reactions. Performed at [facility], [address] [place], [place] [id number]   Salicylate level     Status: Abnormal   Collection Time: 05/14/19 12:04 PM  Result Value Ref Range   Salicylate Lvl [id number] (L) 7.0 - 30.0 mg/dL    Comment: Performed at [facility], [address], [place], [place] [address]  CBC with Differential/Platelet     Status: Abnormal   Collection Time: 05/14/19 12:04 PM  Result Value Ref Range   WBC 21.7 (H) 4.0 - 10.5 K/uL   RBC 5.16 4.22 - 5.81 MIL/uL   Hemoglobin 13.5 13.0 - 17.0 g/dL   HCT 44.2 39.0 - 52.0 %   MCV 85.7 80.0 - 100.0 fL   MCH 26.2 26.0 - 34.0 pg   MCHC 30.5 30.0 - 36.0 g/dL   RDW 17.2 (H) 11.5 - 15.5 %   Platelets 290 150 - 400 K/uL   nRBC 0.0 0.0 - 0.2 %   Neutrophils Relative % 86 %   Neutro Abs 18.5 (H) 1.7 - 7.7 K/uL   Lymphocytes Relative 4 %   Lymphs Abs 0.8 0.7 - 4.0 K/uL   Monocytes Relative 8 %   Monocytes Absolute 1.8 (H) 0.1 - 1.0 K/uL   Eosinophils Relative 0 %   Eosinophils Absolute 0.0 0.0 - 0.5 K/uL   Basophils Relative 0 %   Basophils Absolute 0.1 0.0 - 0.1 K/uL   WBC Morphology MILD LEFT SHIFT (1-5% METAS, OCC MYELO, OCC BANDS)    Immature Granulocytes 2 %   Abs Immature Granulocytes 0.53 (H) 0.00 - 0.07 K/uL    Comment: Performed at [facility], [address], [place], [place] [address]  CBG monitoring, ED     Status: Abnormal   Collection Time: 05/14/19 12:07 PM  Result Value Ref Range   Glucose-Capillary 224 (H) 70 - 99 mg/dL    Comment: Glucose reference range applies only to samples taken after fasting  for at least 8 hours.  Blood Culture (routine x 2)     Status: None (Preliminary result)   Collection Time: 05/14/19 12:12 PM   Specimen: BLOOD  Result Value Ref Range   Specimen Description BLOOD DRAWN BY IV THERAPY    Special Requests      BOTTLES DRAWN AEROBIC AND ANAEROBIC Blood Culture adequate volume   Culture      NO GROWTH < 24 HOURS Performed at [facility], [address] [place], [place] [address]    Report Status PENDING   Blood Culture (routine x 2)     Status: None (Preliminary result)   Collection Time: 05/14/19 12:20 PM   Specimen: BLOOD RIGHT HAND  Result Value Ref Range   Specimen Description BLOOD RIGHT HAND    Special Requests      BOTTLES DRAWN AEROBIC AND ANAEROBIC Blood Culture results may not be optimal due to an inadequate volume of blood received in culture bottles   Culture      NO GROWTH < 24 HOURS Performed at [facility], [address], [place], [place] [address]    Report Status PENDING   MRSA PCR Screening     Status: None   Collection Time: 05/14/19 12:40 PM   Specimen: Nasal Mucosa; Nasopharyngeal  Result Value Ref Range   MRSA by PCR NEGATIVE NEGATIVE    Comment:        The GeneXpert MRSA Assay (FDA approved for NASAL specimens only),  is one component of a comprehensive MRSA colonization surveillance program. It is not intended to diagnose MRSA infection nor to guide or monitor treatment for MRSA infections. Performed at [facility], [address], [place], [place] [address]   Protime-INR     Status: Abnormal   Collection Time: 05/14/19 12:42 PM  Result Value Ref Range   Prothrombin Time 22.0 (H) 11.4 - 15.2 seconds   INR 2.0 (H) 0.8 - 1.2    Comment: (NOTE) INR goal varies based on device and disease states. Performed at [facility], [address], [place], [place] [address]   Lactic acid, plasma     Status: Abnormal   Collection Time: 05/14/19  2:16 PM  Result Value Ref Range   Lactic Acid, Venous 4.3 (HH) 0.5 - 1.9 mmol/L     Comment: CRITICAL RESULT CALLED TO, READ BACK BY AND VERIFIED WITH: MINTER,R AT 1449 ON 5.4.21 BY ISLEY,B Performed at [facility], [address] [place], [place] [address]   Troponin I (High Sensitivity)     Status: None   Collection Time: 05/14/19  2:16 PM  Result Value Ref Range   Troponin I (High Sensitivity) 12 <18 ng/L    Comment: (NOTE) Elevated high sensitivity troponin I (hsTnI) values and significant  changes across serial measurements may suggest ACS but many other  chronic and acute conditions are known to elevate hsTnI results.  Refer to the "Links" section for chest pain algorithms and additional  guidance. Performed at [facility], [address], [place], [place] [address]   Lactic acid, plasma     Status: None   Collection Time: 05/14/19  5:53 PM  Result Value Ref Range   Lactic Acid, Venous 1.8 0.5 - 1.9 mmol/L    Comment: Performed at [facility], [address], [place], [place] [address]  Glucose, capillary     Status: Abnormal   Collection Time: 05/14/19  7:59 PM  Result Value Ref Range   Glucose-Capillary 108 (H) 70 - 99 mg/dL    Comment: Glucose reference range applies only to samples taken after fasting for at least 8 hours.   Comment 1 Notify RN    Comment 2 Document in Chart   Glucose, capillary     Status: None   Collection Time: 05/14/19 11:52 PM  Result Value Ref Range   Glucose-Capillary 94 70 - 99 mg/dL    Comment: Glucose reference range applies only to samples taken after fasting for at least 8 hours.   Comment 1 Notify RN    Comment 2 Document in Chart   Glucose, capillary     Status: Abnormal   Collection Time: 05/15/19  4:35 AM  Result Value Ref Range   Glucose-Capillary 112 (H) 70 - 99 mg/dL    Comment: Glucose reference range applies only to samples taken after fasting for at least 8 hours.  Basic metabolic panel     Status: Abnormal   Collection Time: 05/15/19  5:05 AM  Result Value Ref Range   Sodium 143 135 - 145 mmol/L    Potassium 3.9 3.5 - 5.1 mmol/L   Chloride 115 (H) 98 - 111 mmol/L   CO2 19 (L) 22 - 32 mmol/L   Glucose, Bld 117 (H) 70 - 99 mg/dL    Comment: Glucose reference range applies only to samples taken after fasting for at least 8 hours.   BUN 31 (H) 8 - 23 mg/dL   Creatinine, Ser 2.18 (H) 0.61 - 1.24 mg/dL  Calcium 7.7 (L) 8.9 - 10.3 mg/dL   GFR calc non Af Amer 31 (L) >60 mL/min   GFR calc Af Amer 36 (L) >60 mL/min   Anion gap 9 5 - 15    Comment: Performed at [facility], [address] [place], [place] [address]  CBC     Status: Abnormal   Collection Time: 05/15/19  5:05 AM  Result Value Ref Range   WBC 17.1 (H) 4.0 - 10.5 K/uL   RBC 4.94 4.22 - 5.81 MIL/uL   Hemoglobin 12.9 (L) 13.0 - 17.0 g/dL   HCT 41.7 39.0 - 52.0 %   MCV 84.4 80.0 - 100.0 fL   MCH 26.1 26.0 - 34.0 pg   MCHC 30.9 30.0 - 36.0 g/dL   RDW 17.2 (H) 11.5 - 15.5 %   Platelets 253 150 - 400 K/uL   nRBC 0.0 0.0 - 0.2 %    Comment: Performed at [facility], [address] [place], [place] [address]  Glucose, capillary     Status: Abnormal   Collection Time: 05/15/19  7:27 AM  Result Value Ref Range   Glucose-Capillary 132 (H) 70 - 99 mg/dL    Comment: Glucose reference range applies only to samples taken after fasting for at least 8 hours.  Glucose, capillary     Status: Abnormal   Collection Time: 05/15/19 12:28 PM  Result Value Ref Range   Glucose-Capillary 146 (H) 70 - 99 mg/dL    Comment: Glucose reference range applies only to samples taken after fasting for at least 8 hours.   Personally reviewed CT from 5/4 and CT from 5/5 that I ordered, some extension of the cellulitis laterally but no signs of necrotizing infection with no obvious gas or fluid collection, gluteal collection small without surrounding inflammation or stranding CT Abdomen Pelvis Wo Contrast  Result Date: 05/14/2019 CLINICAL DATA:  Soft tissue infection suspected, pelvis. EXAM: CT ABDOMEN AND PELVIS WITHOUT CONTRAST TECHNIQUE: Multidetector  CT imaging of the abdomen and pelvis was performed following the standard protocol without IV contrast. COMPARISON:  CTA of the abdomen and pelvis /5/20. CT stone study 11/24/2016. FINDINGS: Lower chest: Areas of dependent airspace disease are present in the medial lower lobes bilaterally, left greater than right. Atherosclerotic calcifications present the coronary arteries. Heart size is normal. Hepatobiliary: Gallbladder is mostly collapsed. Liver is unremarkable. Common bile duct is within normal limits. Pancreas: Unremarkable. No pancreatic ductal dilatation or surrounding inflammatory changes. Spleen: Normal in size without focal abnormality. Adrenals/Urinary Tract: Adrenal glands are normal bilaterally. There is some stranding about both kidneys. No obstruction is present. No mass lesion or stone is evident. Atherosclerotic changes are present. Ureters are within normal limits. Urinary bladder is unremarkable. Stomach/Bowel: Stomach and duodenum are within normal limits. Small bowel is unremarkable. Terminal ileum is normal. Ascending transverse colon are normal. Descending and sigmoid colon are normal. Vascular/Lymphatic: Extensive atherosclerotic calcifications are again noted. Left iliac stent is in place. Aneurysmal dilation is present. 7 and 8 mm left inguinal lymph nodes are slightly more prominent than the prior exam. Left femoral lymph nodes have increased in size, likely reactive. Reproductive: Prostate calcifications are present without significant large mint or focal lesion. Other: Skin thickening and extensive subcutaneous edematous changes are present the medial upper left thigh extending into the inguinal region. No discrete abscess or free air is present. No superior extension is present. A well-defined subcutaneous low-density collection along the medial left gluteal cleft measures 8.3 x 7.6 x 25.6 mm. No  significant stranding is associated. Musculoskeletal: Progressive disc disease is present  at L5-S1 with central and bilateral foraminal stenosis. Vacuum disc is now present. Broad-based disc protrusion is also present at L3-4 and L4-5. IMPRESSION: 1. Extensive inflammatory changes the left inguinal region extending into the anterior and medial upper thigh compatible with acute cellulitis. No associated abscess is present. 2. Associated reactive adenopathy. 3. Ascending inflammatory changes. 4. Low-density left perirectal fluid collection measuring up to 25 mm. No associated inflammatory changes are present. This may represent an early or healing perirectal abscess. The area should be amenable to direct visualization. 5. Progressive lower lumbar disc disease. Electronically Signed   By: San Morelle M.D.   On: 05/14/2019 14:29   CT HEAD WO CONTRAST  Result Date: 05/14/2019 CLINICAL DATA:  Encephalopathy, seizures EXAM: CT HEAD WITHOUT CONTRAST TECHNIQUE: Contiguous axial images were obtained from the base of the skull through the vertex without intravenous contrast. COMPARISON:  10/15/2018 FINDINGS: Brain: There is new, age-indeterminate cortical based hypodensity of the posterior right occipital lobe (series 2, image 13). Extensive periventricular and deep white matter hypodensity. Vascular: No hyperdense vessel or unexpected calcification. Skull: Normal. Negative for fracture or focal lesion. Sinuses/Orbits: No acute finding. Other: None. IMPRESSION: 1. There is new cortical based hypodensity of the posterior right occipital lobe, consistent with infarction although age indeterminate, by appearance subacute to chronic. Consider MRI to further evaluate for acute diffusion restricting infarction. 2.  Small-vessel white matter disease. Electronically Signed   By: Eddie Candle M.D.   On: 05/14/2019 17:45   DG Chest Port 1 View  Result Date: 05/14/2019 CLINICAL DATA:  Fever.  Overdose. EXAM: PORTABLE CHEST 1 VIEW COMPARISON:  03/10/2019. FINDINGS: Cardiomegaly. Bilateral mild interstitial  prominence. Tiny bilateral pleural effusions can not be excluded. Mild CHF cannot be excluded. No pneumothorax. Degenerative change thoracic spine. IMPRESSION: Cardiomegaly. Mild bilateral interstitial prominence. Tiny bilateral pleural effusions can not be excluded. Mild CHF cannot be excluded. Electronically Signed   By: Marcello Moores  Register   On: 05/14/2019 12:23     Assessment & Plan:  JACKS MUTTI is a 64 y.o. male with left groin cellulitis that was concerning on my exam due to his tenderness out of proportion to the exam. I ordered a repeat CT non contrast to evaluate for changes or worsening features. This has been reviewed and overall this is stable with some extension to the hip. No signs of necrotizing infection, gas, or collection that needs drained. The gluteal collection is not clinically infected and does not need drained.   -Repeat CT ordered by myself and reviewed, no major changes  -IV antibiotics for cellulitis  -No surgical intervention or debridement indicated  -With his cardiac history and EF if he were to need surgery he would be better served at [facility] with cardiology available 24/7   All questions were answered to the satisfaction of the patient. Discussed with Dr. Carles Collet.  [name] 05/15/2019, 3:35 PM

## 2019-05-16 DIAGNOSIS — E1165 Type 2 diabetes mellitus with hyperglycemia: Secondary | ICD-10-CM

## 2019-05-16 DIAGNOSIS — L03314 Cellulitis of groin: Secondary | ICD-10-CM

## 2019-05-16 DIAGNOSIS — R7989 Other specified abnormal findings of blood chemistry: Secondary | ICD-10-CM

## 2019-05-16 LAB — COMPREHENSIVE METABOLIC PANEL
ALT: 21 U/L (ref 0–44)
AST: 27 U/L (ref 15–41)
Albumin: 2.2 g/dL — ABNORMAL LOW (ref 3.5–5.0)
Alkaline Phosphatase: 72 U/L (ref 38–126)
Anion gap: 10 (ref 5–15)
BUN: 24 mg/dL — ABNORMAL HIGH (ref 8–23)
CO2: 20 mmol/L — ABNORMAL LOW (ref 22–32)
Calcium: 7.7 mg/dL — ABNORMAL LOW (ref 8.9–10.3)
Chloride: 106 mmol/L (ref 98–111)
Creatinine, Ser: 1.74 mg/dL — ABNORMAL HIGH (ref 0.61–1.24)
GFR calc Af Amer: 47 mL/min — ABNORMAL LOW (ref >60)
GFR calc non Af Amer: 41 mL/min — ABNORMAL LOW (ref >60)
Glucose, Bld: 252 mg/dL — ABNORMAL HIGH (ref 70–99)
Potassium: 4 mmol/L (ref 3.5–5.1)
Sodium: 136 mmol/L (ref 135–145)
Total Bilirubin: 1 mg/dL (ref 0.3–1.2)
Total Protein: 6.5 g/dL (ref 6.5–8.1)

## 2019-05-16 LAB — CBC
HCT: 39.3 % (ref 39.0–52.0)
Hemoglobin: 12.4 g/dL — ABNORMAL LOW (ref 13.0–17.0)
MCH: 26.2 pg (ref 26.0–34.0)
MCHC: 31.6 g/dL (ref 30.0–36.0)
MCV: 82.9 fL (ref 80.0–100.0)
Platelets: 255 10*3/uL (ref 150–400)
RBC: 4.74 MIL/uL (ref 4.22–5.81)
RDW: 16.9 % — ABNORMAL HIGH (ref 11.5–15.5)
WBC: 15.1 10*3/uL — ABNORMAL HIGH (ref 4.0–10.5)
nRBC: 0 % (ref 0.0–0.2)

## 2019-05-16 LAB — GLUCOSE, CAPILLARY
Glucose-Capillary: 202 mg/dL — ABNORMAL HIGH (ref 70–99)
Glucose-Capillary: 209 mg/dL — ABNORMAL HIGH (ref 70–99)
Glucose-Capillary: 228 mg/dL — ABNORMAL HIGH (ref 70–99)
Glucose-Capillary: 147 mg/dL — ABNORMAL HIGH (ref 70–99)

## 2019-05-16 LAB — HEMOGLOBIN A1C
Hgb A1c MFr Bld: 9.6 % — ABNORMAL HIGH (ref 4.8–5.6)
Mean Plasma Glucose: 228.82 mg/dL

## 2019-05-16 MED ORDER — SIMETHICONE 80 MG PO CHEW
80.0000 mg | CHEWABLE_TABLET | Freq: Four times a day (QID) | ORAL | Status: DC | PRN
  Administered 2019-05-16 – 2019-05-18 (×3): 80 mg via ORAL
  Filled 2019-05-16 (×3): qty 1

## 2019-05-16 MED ORDER — INSULIN GLARGINE 100 UNIT/ML ~~LOC~~ SOLN
10.0000 [IU] | Freq: Every day | SUBCUTANEOUS | Status: DC
  Administered 2019-05-16 – 2019-05-23 (×7): 10 [IU] via SUBCUTANEOUS
  Filled 2019-05-16 (×10): qty 0.1

## 2019-05-16 MED ORDER — ALUM & MAG HYDROXIDE-SIMETH 200-200-20 MG/5ML PO SUSP
30.0000 mL | Freq: Four times a day (QID) | ORAL | Status: DC | PRN
  Administered 2019-05-16 – 2019-05-17 (×2): 30 mL via ORAL
  Filled 2019-05-16 (×2): qty 30

## 2019-05-16 MED ORDER — INSULIN ASPART 100 UNIT/ML ~~LOC~~ SOLN
0.0000 [IU] | Freq: Every day | SUBCUTANEOUS | Status: DC
  Administered 2019-05-17 – 2019-05-25 (×6): 2 [IU] via SUBCUTANEOUS
  Administered 2019-05-26: 3 [IU] via SUBCUTANEOUS

## 2019-05-16 MED ORDER — INSULIN ASPART 100 UNIT/ML ~~LOC~~ SOLN
0.0000 [IU] | Freq: Three times a day (TID) | SUBCUTANEOUS | Status: DC
  Administered 2019-05-16 (×2): 5 [IU] via SUBCUTANEOUS
  Administered 2019-05-17 (×2): 3 [IU] via SUBCUTANEOUS
  Administered 2019-05-17: 2 [IU] via SUBCUTANEOUS
  Administered 2019-05-18: 3 [IU] via SUBCUTANEOUS
  Administered 2019-05-18 – 2019-05-19 (×2): 8 [IU] via SUBCUTANEOUS
  Administered 2019-05-19: 2 [IU] via SUBCUTANEOUS
  Administered 2019-05-19: 5 [IU] via SUBCUTANEOUS
  Administered 2019-05-20 – 2019-05-22 (×4): 3 [IU] via SUBCUTANEOUS
  Administered 2019-05-22: 2 [IU] via SUBCUTANEOUS
  Administered 2019-05-22: 3 [IU] via SUBCUTANEOUS
  Administered 2019-05-23: 5 [IU] via SUBCUTANEOUS
  Administered 2019-05-23: 2 [IU] via SUBCUTANEOUS
  Administered 2019-05-23 (×2): 3 [IU] via SUBCUTANEOUS
  Administered 2019-05-24 – 2019-05-25 (×4): 5 [IU] via SUBCUTANEOUS
  Administered 2019-05-25: 3 [IU] via SUBCUTANEOUS
  Administered 2019-05-26: 5 [IU] via SUBCUTANEOUS
  Administered 2019-05-26: 3 [IU] via SUBCUTANEOUS
  Administered 2019-05-26 – 2019-05-27 (×2): 5 [IU] via SUBCUTANEOUS
  Administered 2019-05-27 (×2): 3 [IU] via SUBCUTANEOUS

## 2019-05-16 NOTE — Progress Notes (Signed)
PROGRESS NOTE  [name] [id number] DOB: 1955-10-15 DOA: 05/14/2019 PCP: System, Pcp Not In  Brief History:  64 year old male with a history of coronary artery disease and recent NSTEMI, diabetes mellitus type 2, systolic CHF, CKD stage III, hypertension, cocaine abuse, peripheral vascular disease, apical mural thrombus presenting with altered mental status and generalized weakness.  Apparently, the patient was in the bathtub trying to soak his left groin area that has been bothering him for the past 2 weeks.  He had difficulty getting out of the tub because of generalized weakness.  His girlfriend also noted him to be lethargic.  Upon EMS arrival, there was concern of seizure activity and the patient was given Versed and 500 mL fluid bolus.  He was somnolent at the time.  In the emergency department, the patient was awakening to voice, but drifted back to sleep.  Apparently the patient took 3 doses of tramadol because of his left groin pain.  He had denied any chest pain, shortness of breath, nausea, vomiting, diarrhea, rectal pain.  There is no dysuria or hematuria.  Recent hospitalization2/28 - 3/2/21under cardiology service for NSTEMI, troponin peaking at 575, requiring PCI of RCA/PDA,via cardiac cath3/1/21.On dual antiplatelets Plavix and aspirin. Echocardiogram also showed apical filling defect consistent with thrombus/early thrombus formation. Plan was to continue aspirin and Plavix for 30 days, to discontinue aspirin then and addXarelto and continue Plavix long-term.Echo showed EF of 30 to 35%, due to history of intermittent compliance, was felt not to be a candidate for ICD. Unless he could demonstrate compliance with medications and follow-up in the future.  On the morning of 05/15/2019, the patient was alert and oriented x3.  He continued to complain of pain in his left groin.  He continued to have soft blood pressures in the  ICU.  Assessment/Plan: Sepsis -Secondary to left groin/inguinal cellulitis -05/14/2019 CT abdomen/pelvis--extensive inflammatory changes in the left inguinal region extending into the anterior and medial upper thigh.  There is no drainable abscess.  There is a left perirectal fluid collection without inflammatory changes. -5/5 CT pelvis--subQ edema and soft tissue edema mid-lower left inguinal region unchanged--no air or fluid collection in soft tissues -Continue vancomycin and cefepime -Follow blood cultures -UA negative for pyuria  Left groin cellulitis -Continue vancomycin and cefepime pending culture data  Perirectal fluid collection -General surgery consult appreciated -gluteal collection is not clinically infected and does not need drained.   Acute on chronic renal failure--CKD stage III -Baseline creatinine 1.4-1.6 -Presented with serum creatinine 2.95 -Secondary to sepsis and hemodynamic changes -Continue judicious IV fluids -slowly improving but not back to baseline  Acute metabolic encephalopathy -due to toxic ingestion (cocaine, tramadol) and sepsis -Obtain EEG--neg for epileptiform discharges -MRI brain--motion degraded but neg for acute infarct -05/15/2019 CT brain--age-indeterminate hypodensity right posterior occipital lobe -mental status back to baseline  Acute respiratory failure with hypoxia -Likely secondary to hypoventilation and underlying COPD -Currently stable on room air  Uncontrolled DM2 with hyperglycemia -03/10/19 A1C--11.7 -add lantus -continue novolog ISS  Chronic systolic and diastolic CHF -Appears clinically euvolemic -03/11/2019 echo EF 30-35%, grade 1 DD  Coronary artery disease -Patient had recent NSTEMI -Continue aspirin and Plavix -No chest pain presently -Personally reviewed EKG--sinus rhythm, nonspecific T wave change  Cocaine and Tobacco Abuse -cessation discussed       Disposition Plan: Patient From: Home D/C  Place: Home - 2-3  Days Barriers: Not Clinically Stable--remains on IV antibiotics with sepsis  and soft BPs and AKI  Family Communication:   Family at bedside  Consultants:  none  Code Status:  FULL   DVT Prophylaxis:  [place] Heparin   Procedures: As Listed in Progress Note Above  Antibiotics: vanco 5/4>>> Cefepime 5/4>>>    Subjective: Patient complains of pain in left groin, only slightly better.  Denies cp, sob, cough, n/v/d, abd pain.  Had some dysuria yesterday.  Denies visual disturbance, focal extremity weakness, dysarthria  Objective: Vitals:   05/16/19 0300 05/16/19 0400 05/16/19 0500 05/16/19 0600  BP: 95/65 112/69 106/75 102/66  Pulse: 93 94 91 90  Resp:      Temp:      TempSrc:      SpO2: 98% 93% 94% 93%  Weight:   100.1 kg   Height:        Intake/Output Summary (Last 24 hours) at 05/16/2019 0747 Last data filed at 05/16/2019 0300 Gross per 24 hour  Intake 1247.29 ml  Output 3700 ml  Net -2452.71 ml   Weight change: -13.3 kg Exam:   General:  Pt is alert, follows commands appropriately, not in acute distress  HEENT: No icterus, No thrush, No neck mass, [place]/AT  Cardiovascular: RRR, S1/S2, no rubs, no gallops  Respiratory: bibasilar crackles. No wheeze  Abdomen: Soft/+BS, non tender, non distended, no guarding  Extremities: No edema, No lymphangitis, No petechiae, No rashes, no synovitis     Data Reviewed: I have personally reviewed following labs and imaging studies Basic Metabolic Panel: Recent Labs  Lab 05/14/19 1204 05/15/19 0505 05/16/19 0410  NA 137 143 136  K 4.1 3.9 4.0  CL 97* 115* 106  CO2 22 19* 20*  GLUCOSE 246* 117* 252*  BUN 37* 31* 24*  CREATININE 2.95* 2.18* 1.74*  CALCIUM 8.4* 7.7* 7.7*   Liver Function Tests: Recent Labs  Lab 05/14/19 1204 05/16/19 0410  AST 24 27  ALT 19 21  ALKPHOS 77 72  BILITOT 0.9 1.0  PROT 7.6 6.5  ALBUMIN 3.0* 2.2*   No results for input(s): LIPASE, AMYLASE in the last 168  hours. No results for input(s): AMMONIA in the last 168 hours. Coagulation Profile: Recent Labs  Lab 05/14/19 1242  INR 2.0*   CBC: Recent Labs  Lab 05/14/19 1204 05/15/19 0505 05/16/19 0410  WBC 21.7* 17.1* 15.1*  NEUTROABS 18.5*  --   --   HGB 13.5 12.9* 12.4*  HCT 44.2 41.7 39.3  MCV 85.7 84.4 82.9  PLT 290 253 255   Cardiac Enzymes: No results for input(s): CKTOTAL, CKMB, CKMBINDEX, TROPONINI in the last 168 hours. BNP: Invalid input(s): POCBNP CBG: Recent Labs  Lab 05/15/19 0435 05/15/19 0727 05/15/19 1228 05/15/19 1642 05/15/19 2033  GLUCAP 112* 132* 146* 227* 207*   HbA1C: No results for input(s): HGBA1C in the last 72 hours. Urine analysis:    Component Value Date/Time   COLORURINE YELLOW 05/14/2019 1157   APPEARANCEUR HAZY (A) 05/14/2019 1157   LABSPEC 1.013 05/14/2019 1157   PHURINE 5.0 05/14/2019 1157   GLUCOSEU >=500 (A) 05/14/2019 1157   HGBUR MODERATE (A) 05/14/2019 1157   BILIRUBINUR NEGATIVE 05/14/2019 1157   KETONESUR NEGATIVE 05/14/2019 1157   PROTEINUR 30 (A) 05/14/2019 1157   UROBILINOGEN 0.2 09/27/2014 2237   NITRITE NEGATIVE 05/14/2019 1157   LEUKOCYTESUR NEGATIVE 05/14/2019 1157   Sepsis Labs: @LABRCNTIP (procalcitonin:4,lacticidven:4) ) Recent Results (from the past 240 hour(s))  Urine culture     Status: None   Collection Time: 05/14/19 11:57 AM   Specimen: In/Out  Cath Urine  Result Value Ref Range Status   Specimen Description   Final    IN/OUT CATH URINE Performed at [facility], [address], [place], [place] [address]    Special Requests   Final    NONE Performed at [facility], [address] [place], [place] [address]    Culture   Final    NO GROWTH Performed at [facility], [place] [address] [place], [place] [address]    Report Status 05/15/2019 FINAL  Final  Respiratory Panel by RT PCR (Flu A&B, Covid) - Nasopharyngeal Swab     Status: None   Collection Time: 05/14/19 12:00 PM   Specimen:  Nasopharyngeal Swab  Result Value Ref Range Status   SARS Coronavirus 2 by RT PCR NEGATIVE NEGATIVE Final    Comment: (NOTE) SARS-CoV-2 target nucleic acids are NOT DETECTED. The SARS-CoV-2 RNA is generally detectable in upper respiratoy specimens during the acute phase of infection. The lowest concentration of SARS-CoV-2 viral copies this assay can detect is 131 copies/mL. A negative result does not preclude SARS-Cov-2 infection and should not be used as the sole basis for treatment or other patient management decisions. A negative result may occur with  improper specimen collection/handling, submission of specimen other than nasopharyngeal swab, presence of viral mutation(s) within the areas targeted by this assay, and inadequate number of viral copies (<131 copies/mL). A negative result must be combined with clinical observations, patient history, and epidemiological information. The expected result is Negative. Fact Sheet for Patients:  PinkCheek.be Fact Sheet for Healthcare Providers:  GravelBags.it This test is not yet ap proved or cleared by the [place] FDA and  has been authorized for detection and/or diagnosis of SARS-CoV-2 by FDA under an Emergency Use Authorization (EUA). This EUA will remain  in effect (meaning this test can be used) for the duration of the COVID-19 declaration under Section 564(b)(1) of the Act, 21 U.S.C. section 360bbb-3(b)(1), unless the authorization is terminated or revoked sooner.    Influenza A by PCR NEGATIVE NEGATIVE Final   Influenza B by PCR NEGATIVE NEGATIVE Final    Comment: (NOTE) The Xpert Xpress SARS-CoV-2/FLU/RSV assay is intended as an aid in  the diagnosis of influenza from Nasopharyngeal swab specimens and  should not be used as a sole basis for treatment. Nasal washings and  aspirates are unacceptable for Xpert Xpress SARS-CoV-2/FLU/RSV  testing. Fact Sheet for  Patients: PinkCheek.be Fact Sheet for Healthcare Providers: GravelBags.it This test is not yet approved or cleared by the [place] FDA and  has been authorized for detection and/or diagnosis of SARS-CoV-2 by  FDA under an Emergency Use Authorization (EUA). This EUA will remain  in effect (meaning this test can be used) for the duration of the  Covid-19 declaration under Section 564(b)(1) of the Act, 21  U.S.C. section 360bbb-3(b)(1), unless the authorization is  terminated or revoked. Performed at [facility], [address] [place], [place] [address]   Blood Culture (routine x 2)     Status: None (Preliminary result)   Collection Time: 05/14/19 12:12 PM   Specimen: BLOOD  Result Value Ref Range Status   Specimen Description BLOOD DRAWN BY IV THERAPY  Final   Special Requests   Final    BOTTLES DRAWN AEROBIC AND ANAEROBIC Blood Culture adequate volume   Culture   Final    NO GROWTH < 24 HOURS Performed at [facility], [address], [place], [place] [address]    Report Status PENDING  Incomplete  Blood Culture (routine x 2)     Status: None (Preliminary result)   Collection Time: 05/14/19 12:20 PM   Specimen: BLOOD RIGHT HAND  Result Value Ref Range Status   Specimen Description BLOOD RIGHT HAND  Final   Special Requests   Final    BOTTLES DRAWN AEROBIC AND ANAEROBIC Blood Culture results may not be optimal due to an inadequate volume of blood received in culture bottles   Culture   Final    NO GROWTH < 24 HOURS Performed at [facility], [address], [place], [place] [address]    Report Status PENDING  Incomplete  MRSA PCR Screening     Status: None   Collection Time: 05/14/19 12:40 PM   Specimen: Nasal Mucosa; Nasopharyngeal  Result Value Ref Range Status   MRSA by PCR NEGATIVE NEGATIVE Final    Comment:        The GeneXpert MRSA Assay (FDA approved for NASAL specimens only), is one component of  a comprehensive MRSA colonization surveillance program. It is not intended to diagnose MRSA infection nor to guide or monitor treatment for MRSA infections. Performed at [facility], [address], [place], [place] [address]   Culture, blood (Routine X 2) w Reflex to ID Panel     Status: None (Preliminary result)   Collection Time: 05/15/19  5:11 PM   Specimen: BLOOD RIGHT WRIST  Result Value Ref Range Status   Specimen Description BLOOD RIGHT WRIST  Final   Special Requests   Final    BOTTLES DRAWN AEROBIC AND ANAEROBIC Blood Culture adequate volume Performed at [facility], [address] [place], [place] [address]    Culture PENDING  Incomplete   Report Status PENDING  Incomplete  Culture, blood (Routine X 2) w Reflex to ID Panel     Status: None (Preliminary result)   Collection Time: 05/15/19  5:11 PM   Specimen: BLOOD RIGHT ARM  Result Value Ref Range Status   Specimen Description BLOOD RIGHT ARM  Final   Special Requests   Final    BOTTLES DRAWN AEROBIC AND ANAEROBIC Blood Culture adequate volume Performed at [facility], [address], [place],  [address]    Culture PENDING  Incomplete   Report Status PENDING  Incomplete     Scheduled Meds: . aspirin EC  81 mg Oral Daily  . Chlorhexidine Gluconate Cloth  6 each Topical Daily  . clopidogrel  75 mg Oral Q breakfast  . heparin  5,000 Units Subcutaneous Q8H  . insulin aspart  0-15 Units Subcutaneous Q4H   Continuous Infusions: . sodium chloride 50 mL/hr at 05/16/19 0300  . ceFEPime (MAXIPIME) IV Stopped (05/16/19 0032)  . vancomycin Stopped (05/15/19 1826)    Procedures/Studies: CT Abdomen Pelvis Wo Contrast  Result Date: 05/14/2019 CLINICAL DATA:  Soft tissue infection suspected, pelvis. EXAM: CT ABDOMEN AND PELVIS WITHOUT CONTRAST TECHNIQUE: Multidetector CT imaging of the abdomen and pelvis was performed following the standard protocol without IV contrast. COMPARISON:  CTA of the abdomen and pelvis  /5/20. CT stone study 11/24/2016. FINDINGS: Lower chest: Areas of dependent airspace disease are present in the medial lower lobes bilaterally, left greater than right. Atherosclerotic calcifications present the coronary arteries. Heart size is normal. Hepatobiliary: Gallbladder is mostly collapsed. Liver is unremarkable. Common bile duct is within normal limits. Pancreas: Unremarkable. No pancreatic ductal dilatation or surrounding inflammatory changes. Spleen: Normal in size without focal abnormality. Adrenals/Urinary Tract: Adrenal glands are normal bilaterally. There is some stranding  about both kidneys. No obstruction is present. No mass lesion or stone is evident. Atherosclerotic changes are present. Ureters are within normal limits. Urinary bladder is unremarkable. Stomach/Bowel: Stomach and duodenum are within normal limits. Small bowel is unremarkable. Terminal ileum is normal. Ascending transverse colon are normal. Descending and sigmoid colon are normal. Vascular/Lymphatic: Extensive atherosclerotic calcifications are again noted. Left iliac stent is in place. Aneurysmal dilation is present. 7 and 8 mm left inguinal lymph nodes are slightly more prominent than the prior exam. Left femoral lymph nodes have increased in size, likely reactive. Reproductive: Prostate calcifications are present without significant large mint or focal lesion. Other: Skin thickening and extensive subcutaneous edematous changes are present the medial upper left thigh extending into the inguinal region. No discrete abscess or free air is present. No superior extension is present. A well-defined subcutaneous low-density collection along the medial left gluteal cleft measures 8.3 x 7.6 x 25.6 mm. No significant stranding is associated. Musculoskeletal: Progressive disc disease is present at L5-S1 with central and bilateral foraminal stenosis. Vacuum disc is now present. Broad-based disc protrusion is also present at L3-4 and L4-5.  IMPRESSION: 1. Extensive inflammatory changes the left inguinal region extending into the anterior and medial upper thigh compatible with acute cellulitis. No associated abscess is present. 2. Associated reactive adenopathy. 3. Ascending inflammatory changes. 4. Low-density left perirectal fluid collection measuring up to 25 mm. No associated inflammatory changes are present. This may represent an early or healing perirectal abscess. The area should be amenable to direct visualization. 5. Progressive lower lumbar disc disease. Electronically Signed   By: San Morelle M.D.   On: 05/14/2019 14:29   CT HEAD WO CONTRAST  Result Date: 05/14/2019 CLINICAL DATA:  Encephalopathy, seizures EXAM: CT HEAD WITHOUT CONTRAST TECHNIQUE: Contiguous axial images were obtained from the base of the skull through the vertex without intravenous contrast. COMPARISON:  10/15/2018 FINDINGS: Brain: There is new, age-indeterminate cortical based hypodensity of the posterior right occipital lobe (series 2, image 13). Extensive periventricular and deep white matter hypodensity. Vascular: No hyperdense vessel or unexpected calcification. Skull: Normal. Negative for fracture or focal lesion. Sinuses/Orbits: No acute finding. Other: None. IMPRESSION: 1. There is new cortical based hypodensity of the posterior right occipital lobe, consistent with infarction although age indeterminate, by appearance subacute to chronic. Consider MRI to further evaluate for acute diffusion restricting infarction. 2.  Small-vessel white matter disease. Electronically Signed   By: Eddie Candle M.D.   On: 05/14/2019 17:45   CT PELVIS WO CONTRAST  Result Date: 05/15/2019 CLINICAL DATA:  Myositis worrisome for infection. Possible necrotizing infection left groin/hip region. EXAM: CT PELVIS WITHOUT CONTRAST TECHNIQUE: Multidetector CT imaging of the pelvis was performed following the standard protocol without intravenous contrast. COMPARISON:  CT 05/14/2019  FINDINGS: Urinary Tract: Visualized ureters and bladder are normal. Visualized lower poles of the kidneys unchanged. Bowel:  Visualized small bowel and colon are unremarkable. Vascular/Lymphatic: Calcified plaque over the abdominal aorta which is otherwise normal in caliber. Slight increased number of small left superficial inguinal lymph nodes which are unchanged and likely reactive. No change in a few small left external iliac lymph nodes. No other adenopathy. Reproductive:  Normal. Other: Stable subcutaneous edema over the soft tissues of the left mid to lower inguinal region extending towards the medial upper thigh just superficial to the abductor muscle group. Mild focal fluid over the medial upper thigh unchanged, although no definite subcutaneous abscess. These findings are unchanged and compatible rib patient's known soft tissue  infection. No evidence of air within the soft tissues. No evidence of involvement of the underlying muscle compartment. No extension to the perianal/perirectal space. Musculoskeletal: Unchanged. IMPRESSION: 1. Subcutaneous edema and soft tissue changes over the mid to lower left inguinal region which are unchanged and compatible with patient's known soft tissue infection. No evidence of air in the soft tissues. No definite subcutaneous abscess. 2.  Aortic Atherosclerosis (ICD10-I70.0). Electronically Signed   By: Marin Olp M.D.   On: 05/15/2019 12:09   MR BRAIN WO CONTRAST  Result Date: 05/15/2019 CLINICAL DATA:  Abnormal CT brain, seizure, encephalopathy. Additional provided: Weakness and confusion. EXAM: MRI HEAD WITHOUT CONTRAST TECHNIQUE: Multiplanar, multiecho pulse sequences of the brain and surrounding structures were obtained without intravenous contrast. COMPARISON:  Noncontrast head CT 05/14/2019, head CT 10/15/2018 FINDINGS: Brain: The patient was unable to tolerate the full examination. Only a sagittal T1 weighted sequence, coronal diffusion-weighted sequence,  axial diffusion-weighted sequence, axial T2 weighted sequence, axial T2/FLAIR sequence and axial T2* sequence could be obtained. Multiple sequences are significantly motion degraded. Most notably, there is moderate motion degradation of the sagittal T1 weighted sequence, moderate/severe motion degradation of the coronal diffusion-weighted sequence, moderate/severe motion degradation of the axial T2 weighted sequence, severe motion degradation of the axial T2/FLAIR sequence and severe motion degradation of the axial T2* sequence. This significantly limits evaluation. There is an incompletely assessed focus of T2/FLAIR hyperintensity within the right occipital lobe. Suggestion of volume loss at this site on axial T2 weighted imaging. There is some diffusion-weighted hyperintensity at this site without definite ADC map correlate (series 7, image 19). Incompletely assessed chronic small vessel ischemic disease with chronic lacunar infarcts in the bilateral cerebral white matter, basal ganglia, thalami and cerebellar hemispheres. There is no midline shift. No extra-axial fluid collection is identified. Severe motion degradation of the axial T2* sequence precludes adequate evaluation for chronic intracranial blood products. Mild generalized parenchymal atrophy. Vascular: Expected proximal arterial flow voids. Skull and upper cervical spine: No focal marrow lesion is identified within the limitations of motion degradation. Sinuses/Orbits: Visualized orbits show no acute finding. Mild ethmoid sinus mucosal thickening. Small left maxillary sinus mucous retention cysts. No significant mastoid effusion. IMPRESSION: Significantly motion degraded, prematurely terminated and limited examination as described. Incompletely assessed focus of T2/FLAIR hyperintensity within the right occipital lobe with suggestion of volume loss at this site. There is some diffusion-weighted hyperintensity at this site, although the without definite  ADC correlate. Overall, the appearance on today's exam and on prior head CT are suggestive of chronic infarct. However, given significant motion degradation, a repeat noncontrast MRI is recommended for confirmation when the patient is better able to tolerate the study. Chronic small vessel ischemic disease with multiple chronic lacunar infarcts as described. Mild generalized parenchymal atrophy. Electronically Signed   By: Kellie Simmering DO   On: 05/15/2019 14:13   DG Chest Port 1 View  Result Date: 05/14/2019 CLINICAL DATA:  Fever.  Overdose. EXAM: PORTABLE CHEST 1 VIEW COMPARISON:  03/10/2019. FINDINGS: Cardiomegaly. Bilateral mild interstitial prominence. Tiny bilateral pleural effusions can not be excluded. Mild CHF cannot be excluded. No pneumothorax. Degenerative change thoracic spine. IMPRESSION: Cardiomegaly. Mild bilateral interstitial prominence. Tiny bilateral pleural effusions can not be excluded. Mild CHF cannot be excluded. Electronically Signed   By: Marcello Moores  Register   On: 05/14/2019 12:23    Orson Eva, DO  [facility]  If 7PM-7AM, please contact night-coverage [email or web address] Password [email or web address] 05/16/2019, 7:47 AM   LOS: 2 days

## 2019-05-16 NOTE — Progress Notes (Signed)
[facility]  Overall redness looks like it is improving. Was marked yesterday. Tenderness is better too with palpation of that are in the left groin.   CBC    Component Value Date/Time   WBC 15.1 (H) 05/16/2019 0410   RBC 4.74 05/16/2019 0410   HGB 12.4 (L) 05/16/2019 0410   HGB 16.0 11/28/2017 1208   HCT 39.3 05/16/2019 0410   HCT 47.9 11/28/2017 1208   PLT 255 05/16/2019 0410   PLT 248 11/28/2017 1208   MCV 82.9 05/16/2019 0410   MCV 77 (L) 11/28/2017 1208   MCH 26.2 05/16/2019 0410   MCHC 31.6 05/16/2019 0410   RDW 16.9 (H) 05/16/2019 0410   RDW 15.3 11/28/2017 1208   LYMPHSABS 0.8 05/14/2019 1204   LYMPHSABS 1.9 11/28/2017 1208   MONOABS 1.8 (H) 05/14/2019 1204   EOSABS 0.0 05/14/2019 1204   EOSABS 0.6 (H) 11/28/2017 1208   BASOSABS 0.1 05/14/2019 1204   BASOSABS 0.1 11/28/2017 1208    Leukocytosis coming down.  Continue IV antibiotics. No surgical intervention indicated at this time.  Curlene Labrum, MD [facility] [address] [place], [place] [address] [phone] (office)

## 2019-05-16 NOTE — Progress Notes (Signed)
Patient refused 0400 CBG check.

## 2019-05-17 LAB — BASIC METABOLIC PANEL
Anion gap: 12 (ref 5–15)
BUN: 20 mg/dL (ref 8–23)
CO2: 21 mmol/L — ABNORMAL LOW (ref 22–32)
Calcium: 8 mg/dL — ABNORMAL LOW (ref 8.9–10.3)
Chloride: 104 mmol/L (ref 98–111)
Creatinine, Ser: 1.49 mg/dL — ABNORMAL HIGH (ref 0.61–1.24)
GFR calc Af Amer: 57 mL/min — ABNORMAL LOW (ref >60)
GFR calc non Af Amer: 49 mL/min — ABNORMAL LOW (ref >60)
Glucose, Bld: 153 mg/dL — ABNORMAL HIGH (ref 70–99)
Potassium: 3.8 mmol/L (ref 3.5–5.1)
Sodium: 137 mmol/L (ref 135–145)

## 2019-05-17 LAB — GLUCOSE, CAPILLARY
Glucose-Capillary: 139 mg/dL — ABNORMAL HIGH (ref 70–99)
Glucose-Capillary: 167 mg/dL — ABNORMAL HIGH (ref 70–99)
Glucose-Capillary: 179 mg/dL — ABNORMAL HIGH (ref 70–99)
Glucose-Capillary: 211 mg/dL — ABNORMAL HIGH (ref 70–99)

## 2019-05-17 MED ORDER — POLYETHYLENE GLYCOL 3350 17 G PO PACK
17.0000 g | PACK | Freq: Every day | ORAL | Status: DC
  Administered 2019-05-17 – 2019-05-25 (×7): 17 g via ORAL
  Filled 2019-05-17 (×7): qty 1

## 2019-05-17 MED ORDER — RIVAROXABAN 20 MG PO TABS: 20.0000 mg | ORAL_TABLET | Freq: Every day | ORAL | Status: DC

## 2019-05-17 MED ORDER — BISACODYL 10 MG RE SUPP
10.0000 mg | Freq: Once | RECTAL | Status: AC
  Administered 2019-05-17: 10 mg via RECTAL
  Filled 2019-05-17: qty 1

## 2019-05-17 NOTE — NC FL2 (Signed)
[name] LEVEL OF CARE SCREENING TOOL     IDENTIFICATION  Patient Name: [name] Birthdate: 04-07-1955 Sex: male Admission Date (Current Location): 05/14/2019  [facility] and [place] Number:  [employer] and Address:  [facility] [address] [place]      Provider Number: [id number]  Attending Physician Name and Address:  Orson Eva, MD  Relative Name and Phone Number:       Current Level of Care: Hospital Recommended Level of Care: [place] Prior Approval Number:    Date Approved/Denied:   PASRR Number: [id number]  Discharge Plan: SNF    Current Diagnoses: Patient Active Problem List   Diagnosis Date Noted  . Uncontrolled type 2 diabetes mellitus with hyperglycemia ([place]) 05/16/2019  . Elevated lactic acid level   . Acute renal failure superimposed on stage 3b chronic kidney disease ([place]) 05/15/2019  . Cocaine abuse ([place]) 05/15/2019  . Cellulitis of left groin   . Severe sepsis ([place]) 05/14/2019  . Unstable angina ([place]) 10/16/2018  . Apical mural thrombus 10/15/2018  . Claudication in peripheral vascular disease ([place]) 11/30/2017  . Peripheral arterial disease ([place]) 11/28/2017  . Chest pain 11/03/2016  . Constipation 11/03/2016  . CKD (chronic kidney disease), stage II 08/02/2016  . Cellulitis of right hand 08/02/2016  . Chronic pain 08/02/2016  . AKI (acute kidney injury) ([place]) 01/31/2016  . Lobar pneumonia ([place]) 01/31/2016  . Influenza A 01/31/2016  . Diarrhea 01/30/2016  . NSVT (nonsustained ventricular tachycardia) ([place]) 09/12/2015  . Precordial chest pain 09/10/2015  . Cocaine use 09/04/2015  . Near syncope 06/29/2015  . Essential hypertension 06/29/2015  . Status post coronary artery stent placement 06/29/2015  . Insulin dependent diabetes mellitus 06/29/2015  . History of MI (myocardial infarction) 06/29/2015  . Chronic systolic CHF [id number]  . Musculoskeletal chest pain  05/27/2015  . OSA on CPAP 01/21/2015  . Rectal bleeding   . Stage 3 chronic renal impairment associated with type 2 diabetes mellitus ([place]) 09/09/2013  . 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 CAD (coronary artery disease) 09/09/2013  . Cardiomyopathy, ischemic-EF 40-45% by echo 09/07/13 09/09/2013  . S/P LAD DES June 2014 09/06/2013  . Tobacco use disorder 09/06/2013  . Hyperglycemia 08/22/2012  . NSTEMI (non-ST elevated myocardial infarction) ([place]) 07/07/2012  . Dyslipidemia 07/07/2012  . Hypertensive heart disease     Orientation RESPIRATION BLADDER Height & Weight     Self, Time, Situation, Place  Normal Continent Weight: 102.6 kg Height:  5\' 9"  (175.3 cm)  BEHAVIORAL SYMPTOMS/MOOD NEUROLOGICAL BOWEL NUTRITION STATUS      Continent Diet(see DC summary)  AMBULATORY STATUS COMMUNICATION OF NEEDS Skin   Extensive Assist Verbally (cellulitis left leg)                       Personal Care Assistance Level of Assistance  Bathing, Dressing, Feeding Bathing Assistance: Limited assistance Feeding assistance: Independent Dressing Assistance: Limited assistance     Functional Limitations Info  Sight, Hearing, Speech Sight Info: Adequate Hearing Info: Adequate Speech Info: Adequate    SPECIAL CARE FACTORS FREQUENCY                       Contractures Contractures Info: Not present    Additional Factors Info  Code Status, Allergies Code Status Info: Full Allergies Info: NKA           Current Medications (05/17/2019):  This is the current hospital active medication list Current Facility-Administered Medications  Medication Dose Route Frequency Provider Last Rate Last Admin  . acetaminophen (TYLENOL) tablet 650 mg  650 mg Oral Q6H PRN Emokpae, Ejiroghene E, MD   650 mg at 05/15/19 1717   Or  . acetaminophen (TYLENOL) suppository 650 mg  650 mg Rectal Q6H PRN Emokpae, Ejiroghene E, MD      . alum & mag hydroxide-simeth (MAALOX/MYLANTA)  200-200-20 MG/5ML suspension 30 mL  30 mL Oral Q6H PRN Tat, [name], MD   30 mL at 05/17/19 0817  . bisacodyl (DULCOLAX) suppository 10 mg  10 mg Rectal Once Tat, David, MD      . ceFEPIme (MAXIPIME) 2 g in sodium chloride 0.9 % 100 mL IVPB  2 g Intravenous Q12H Emokpae, Ejiroghene E, MD 200 mL/hr at 05/17/19 1303 2 g at 05/17/19 1303  . Chlorhexidine Gluconate Cloth 2 % PADS 6 each  6 each Topical Daily Oswald Hillock, MD   6 each at 05/17/19 [phone]  . clopidogrel (PLAVIX) tablet 75 mg  75 mg Oral Q breakfast Emokpae, Ejiroghene E, MD   75 mg at 05/17/19 0803  . heparin injection 5,000 Units  5,000 Units Subcutaneous Franco Collet, MD   5,000 Units at 05/17/19 [phone]  . insulin aspart (novoLOG) injection 0-15 Units  0-15 Units Subcutaneous TID WC Orson Eva, MD   3 Units at 05/17/19 1304  . insulin aspart (novoLOG) injection 0-5 Units  0-5 Units Subcutaneous QHS Tat, David, MD      . insulin glargine (LANTUS) injection 10 Units  10 Units Subcutaneous Daily Tat, David, MD   10 Units at 05/17/19 1304  . ondansetron (ZOFRAN) tablet 4 mg  4 mg Oral Q6H PRN Emokpae, Ejiroghene E, MD       Or  . ondansetron (ZOFRAN) injection 4 mg  4 mg Intravenous Q6H PRN Emokpae, Ejiroghene E, MD   4 mg at 05/16/19 1716  . polyethylene glycol (MIRALAX / GLYCOLAX) packet 17 g  17 g Oral Daily Tat, David, MD      . Derrill Memo ON 05/18/2019] rivaroxaban (XARELTO) tablet 20 mg  20 mg Oral Q supper Tat, David, MD      . simethicone [facility]) chewable tablet 80 mg  80 mg Oral QID PRN Lang Snow, FNP   80 mg at 05/17/19 0410  . vancomycin (VANCOREADY) IVPB 1250 mg/250 mL  1,250 mg Intravenous Q24H Bethena Roys, MD   Stopped at 05/16/19 1831     Discharge Medications: Please see discharge summary for a list of discharge medications.  Relevant Imaging Results:  Relevant Lab Results:   Additional Information SSN [phone]  Neilah Fulwider, Chauncey Reading, RN

## 2019-05-17 NOTE — Evaluation (Signed)
Physical Therapy Evaluation Patient Details Name: [name] MRN: [id number] DOB: 25-Sep-1955 Today's Date: 05/17/2019   History of Present Illness  [name] is a 64 y.o. male with medical history significant for CAD, DM, systolic CHF, CKD 3, hypertension, diabetes, cocaine use, OSA, peripheral vascular disease, apical mural thrombus.Patient was brought to the ED via EMS with reports of overdose on tramadol-patient reported he had taken 3 doses of tramadol he was found in the bathtub, apparently he was soaking his whole left groin/perineal area.  At that time he was awake and alert.  Patient's blood pressure was soft/low, subsequently EMS sat patient up to get his vitals, there was a report of seizure activity, patient was unresponsive and shaky/tremulous.  He was given 5 mg of Versed, 500 mill bolus of fluids.  On my evaluation, patient awakens to touch and voice, but drifts back to sleep.  He tells me he took just 2 doses of Ultram.  Reports pain and swelling in his left groin.  He denies difficulty breathing or cough, no abdominal pain, no pain with urination.    Clinical Impression  Patient limited for functional mobility as stated below secondary to pain, BLE weakness, fatigue and poor sitting balance. Patient requires max assist to transition to long sit but is unable to sit fully upright secondary to impaired core strength. He is unable to move LLE secondary to pain. Attempted to sit patient several times but he is limited by pain and weakness and fatigues quickly with attempts to sit. Patient repositioned in bed -RN Aware.  Patient will benefit from continued physical therapy in hospital and recommended venue below to increase strength, balance, endurance for safe ADLs and gait.     Follow Up Recommendations SNF    Equipment Recommendations  None recommended by PT    Recommendations for Other Services       Precautions / Restrictions Precautions Precautions:  Fall Restrictions Weight Bearing Restrictions: No      Mobility  Bed Mobility Overal bed mobility: Needs Assistance Bed Mobility: Supine to Sit;Sit to Supine     Supine to sit: HOB elevated;Max assist Sit to supine: HOB elevated;Max assist   General bed mobility comments: patient max assist to transiton to long sitting with HOB elevated, patient limited by pain in LLE and weakness  Transfers                    Ambulation/Gait                Stairs            Wheelchair Mobility    Modified Rankin (Stroke Patients Only)       Balance Overall balance assessment: Needs assistance   Sitting balance-Leahy Scale: Poor Sitting balance - Comments: in bed                                     Pertinent Vitals/Pain Pain Assessment: Faces Faces Pain Scale: Hurts whole lot Pain Location: L leg Pain Intervention(s): Limited activity within patient's tolerance;Monitored during session;Repositioned    Home Living Family/patient expects to be discharged to:: Private residence Living Arrangements: Other (Comment)("family, friend, something like that") Available Help at Discharge: Family;Friend(s);Available PRN/intermittently Type of Home: Mobile home Home Access: Stairs to enter Entrance Stairs-Rails: Can reach both;Right;Left Entrance Stairs-Number of Steps: 2 Home Layout: One level Home Equipment: Walker - 4 wheels;Shower seat;Toilet riser  Prior Function Level of Independence: Needs assistance   Gait / Transfers Assistance Needed: Patient states household ambulator with rollator recently  ADL's / Homemaking Assistance Needed: states independent        Hand Dominance        Extremity/Trunk Assessment   Upper Extremity Assessment Upper Extremity Assessment: Generalized weakness    Lower Extremity Assessment Lower Extremity Assessment: Generalized weakness       Communication   Communication: No difficulties   Cognition Arousal/Alertness: Awake/alert Behavior During Therapy: WFL for tasks assessed/performed Overall Cognitive Status: Within Functional Limits for tasks assessed                                        General Comments      Exercises     Assessment/Plan    PT Assessment Patient needs continued PT services  PT Problem List Decreased strength;Decreased activity tolerance;Decreased balance;Decreased mobility;Pain       PT Treatment Interventions DME instruction;Balance training;Gait training;Neuromuscular re-education;Stair training;Functional mobility training;Patient/family education;Therapeutic activities;Therapeutic exercise    PT Goals (Current goals can be found in the Care Plan section)  Acute Rehab PT Goals Patient Stated Goal: get better PT Goal Formulation: With patient Time For Goal Achievement: 05/31/19    Frequency Min 3X/week   Barriers to discharge        Co-evaluation               AM-PAC PT "6 Clicks" Mobility  Outcome Measure Help needed turning from your back to your side while in a flat bed without using bedrails?: A Lot Help needed moving from lying on your back to sitting on the side of a flat bed without using bedrails?: A Lot Help needed moving to and from a bed to a chair (including a wheelchair)?: Total Help needed standing up from a chair using your arms (e.g., wheelchair or bedside chair)?: Total Help needed to walk in hospital room?: Total Help needed climbing 3-5 steps with a railing? : Total 6 Click Score: 8    End of Session   Activity Tolerance: Patient limited by pain;Patient limited by fatigue Patient left: in bed;with call bell/phone within reach Nurse Communication: Mobility status PT Visit Diagnosis: Unsteadiness on feet (R26.81);Other abnormalities of gait and mobility (R26.89);Muscle weakness (generalized) (M62.81)    Time: [id number] PT Time Calculation (min) (ACUTE ONLY): 22 min   Charges:    PT Evaluation $PT Eval Moderate Complexity: 1 Mod PT Treatments $Therapeutic Activity: 8-22 mins       3:19 PM, 05/17/19 Mearl Latin PT, DPT Physical Therapist at [facility]

## 2019-05-17 NOTE — Progress Notes (Signed)
Pharmacy Antibiotic Note  KAMARRI GANDY is a 64 y.o. male admitted on 05/14/2019 with infection of unknown source. Pharmacy has been consulted for vancomycin and cefepime  dosing.   Plan: Continue cefepime 2000 mg IV every 12 hours. Continue vancomycin 1.25g IV  q24h Predicted AUC:  483  mcg*h/mL  ( used Scr of: 2.95mg /dL) Pharmacy will continue to monitor renal function, vancomycin levels as clinically appropriate,  cultures and patient progress.  Height: 5\' 9"  (175.3 cm) Weight: 102.6 kg (226 lb 3.1 oz) IBW/kg (Calculated) : 70.7  Temp (24hrs), Avg:98.5 F (36.9 C), Min:97.4 F (36.3 C), Max:100.4 F (38 C)    No Known Allergies  Antimicrobials this admission: cefepime 5/4>>    vancomycin 5/4 >>  Microbiology results: 5/4 BC x2:  ngtd 5/4 UCx: ng 5/4 Resp PCR: SARS CoV-2:  neg      5/4 MRSA PCR:  negative   Thank you for allowing pharmacy to be a part of this patient's care.  Ramond Craver 05/17/2019 4:03 PM

## 2019-05-17 NOTE — TOC Initial Note (Signed)
Transition of Care [facility]) - Initial/Assessment Note    Patient Details  Name: [name] MRN: [id number] Date of Birth: [birth year]  [facility]) CM/SW Contact:    Nykolas Bacallao, Chauncey Reading, RN Phone Number: 05/17/2019, 4:05 PM  Clinical Narrative:   -Secondary to left groin/inguinal cellulitis -05/14/2019 CT abdomen/pelvis--extensive inflammatory changes in the left inguinal region extending into the anterior and medial upper thigh . Recommended for SNF. Patient agreeable. Discussed sending out to all local facilities, patient agreeable.          Expected Discharge Plan: Skilled Nursing Facility Barriers to Discharge: Continued Medical Work up   Patient Goals and CMS Choice Patient states their goals for this hospitalization and ongoing recovery are:: go to SNF and return home CMS Medicare.gov Compare Post Acute Care list provided to:: Patient Choice offered to / list presented to : Patient  Expected Discharge Plan and Services Expected Discharge Plan: [place]                                 Prior Living Arrangements/Services   Lives with:: Significant Other                   Activities of Daily Living              Admission diagnosis:  AKI (acute kidney injury) ([place]) [N17.9] Elevated lactic acid level [R79.89] Cellulitis of left abdominal wall [L03.311] Severe sepsis ([place]) [A41.9, R65.20] Opiate overdose, accidental or unintentional, initial encounter [facility]) [T40.601A] Patient Active Problem List   Diagnosis Date Noted  . Uncontrolled type 2 diabetes mellitus with hyperglycemia ([place]) 05/16/2019  . Elevated lactic acid level   . Acute renal failure superimposed on stage 3b chronic kidney disease ([place]) 05/15/2019  . Cocaine abuse ([place]) 05/15/2019  . Cellulitis of left groin   . Severe sepsis ([place]) 05/14/2019  . Unstable angina ([place]) 10/16/2018  . Apical mural thrombus 10/15/2018  . Claudication in peripheral vascular  disease ([place]) 11/30/2017  . Peripheral arterial disease ([place]) 11/28/2017  . Chest pain 11/03/2016  . Constipation 11/03/2016  . CKD (chronic kidney disease), stage II 08/02/2016  . Cellulitis of right hand 08/02/2016  . Chronic pain 08/02/2016  . AKI (acute kidney injury) ([place]) 01/31/2016  . Lobar pneumonia ([place]) 01/31/2016  . Influenza A 01/31/2016  . Diarrhea 01/30/2016  . NSVT (nonsustained ventricular tachycardia) ([place]) 09/12/2015  . Precordial chest pain 09/10/2015  . Cocaine use 09/04/2015  . Near syncope 06/29/2015  . Essential hypertension 06/29/2015  . Status post coronary artery stent placement 06/29/2015  . Insulin dependent diabetes mellitus 06/29/2015  . History of MI (myocardial infarction) 06/29/2015  . Chronic systolic CHF [id number]  . Musculoskeletal chest pain 05/27/2015  . OSA on CPAP 01/21/2015  . Rectal bleeding   . Stage 3 chronic renal impairment associated with type 2 diabetes mellitus ([place]) 09/09/2013  . 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 CAD (coronary artery disease) 09/09/2013  . Cardiomyopathy, ischemic-EF 40-45% by echo 09/07/13 09/09/2013  . S/P LAD DES June 2014 09/06/2013  . Tobacco use disorder 09/06/2013  . Hyperglycemia 08/22/2012  . NSTEMI (non-ST elevated myocardial infarction) ([place]) 07/07/2012  . Dyslipidemia 07/07/2012  . Hypertensive heart disease    PCP:  System, Pcp Not In Pharmacy:   [place], [place] [address] Phone: [phone] Fax: [phone]  [place], [place] [address] Phone: [phone] Fax: [phone]     Social Determinants of Health (SDOH) Interventions    Readmission Risk Interventions No flowsheet data found.

## 2019-05-17 NOTE — Progress Notes (Signed)
CSW initiated insurance authorization with [employer]. Reference [id number]. Faxed facesheet, demographics, H&P, PT evaluation, and recent progress note.  Tobi Bastos, LCSW Transitions of Care Clinical Social Worker [facility] Ph: [phone]

## 2019-05-17 NOTE — Plan of Care (Signed)
  Problem: Acute Rehab PT Goals(only PT should resolve) Goal: Pt Will Go Supine/Side To Sit Outcome: Progressing Flowsheets (Taken 05/17/2019 1520) Pt will go Supine/Side to Sit: with moderate assist Goal: Pt Will Go Sit To Supine/Side Outcome: Progressing Flowsheets (Taken 05/17/2019 1520) Pt will go Sit to Supine/Side: with moderate assist Goal: Patient Will Perform Sitting Balance Outcome: Progressing Flowsheets (Taken 05/17/2019 1520) Patient will perform sitting balance: with moderate assist Goal: Patient Will Transfer Sit To/From Stand Outcome: Progressing Flowsheets (Taken 05/17/2019 1520) Patient will transfer sit to/from stand: with moderate assist  3:21 PM, 05/17/19 Mearl Latin PT, DPT Physical Therapist at [facility]

## 2019-05-17 NOTE — Progress Notes (Signed)
PROGRESS NOTE  Russell BISSONNETTE [id number] DOB: 11-07-1955 DOA: 05/14/2019 PCP: System, Pcp Not In  Brief History: 64 year old male with a history of coronary artery disease and recent NSTEMI, diabetes mellitus type 2, systolic CHF, CKD stage III, hypertension, cocaine abuse, peripheral vascular disease, apical mural thrombus presenting with altered mental status and generalized weakness. Apparently, the patient was in the bathtub trying to soak his left groin area that has been bothering him for the past 2 weeks. He had difficulty getting out of the tub because of generalized weakness. His girlfriend also noted him to be lethargic. Upon EMS arrival, there was concern of seizure activity and the patient was given Versed and 500 mL fluid bolus. He was somnolent at the time. In the emergency department, the patient was awakening to voice, but drifted back to sleep. Apparently the patient took 3 doses of tramadol because of his left groin pain. He had denied any chest pain, shortness of breath, nausea, vomiting, diarrhea, rectal pain. There is no dysuria or hematuria.  Recent hospitalization2/28 - 3/2/21under cardiology service for NSTEMI, troponin peaking at 575, requiring PCI of RCA/PDA,via cardiac cath3/1/21.On dual antiplatelets Plavix and aspirin. Echocardiogram also showed apical filling defect consistent with thrombus/early thrombus formation. Plan was to continue aspirin and Plavix for 30 days, to discontinue aspirin then and addXarelto and continue Plavix long-term.Echo showed EF of 30 to 35%, due to history of intermittent compliance, was felt not to be a candidate for ICD. Unless he could demonstrate compliance with medications and follow-up in the future.  On the morning of 05/15/2019, the patient was alert and oriented x3. He continued to complain of pain in his left groin. He continued to have soft blood pressures in the ICU.  Assessment/Plan: Sepsis  -Secondary to left groin/inguinal cellulitis -05/14/2019 CT abdomen/pelvis--extensive inflammatory changes in the left inguinal region extending into the anterior and medial upper thigh. There is no drainable abscess. There is a left perirectal fluid collection without inflammatory changes. -5/5 CT pelvis--subQ edema and soft tissue edema mid-lower left inguinal region unchanged--no air or fluid collection in soft tissues -Continue vancomycin and cefepime -Follow blood cultures--neg to date -UA negative for pyuria  Left groin cellulitis -Continue vancomycin and cefepime -remains indurated with significant rubor and pain  Perirectal fluid collection -General surgery consult appreciated -gluteal collection is not clinically infected and does not need drained.  Acute on chronic renal failure--CKD stage III -Baseline creatinine 1.4-1.6 -Presented with serum creatinine 2.95 -Secondary to sepsis and hemodynamic changes -Continue judicious IV fluids-->saline lock  Acute metabolic encephalopathy -due to toxic ingestion (cocaine, tramadol) and sepsis -Obtain EEG--neg for epileptiform discharges -MRI brain--motion degraded but neg for acute infarct -05/15/2019 CT brain--age-indeterminate hypodensity right posterior occipital lobe -mental status back to baseline  Acute respiratory failure with hypoxia -Likely secondary to hypoventilation and underlying COPD -Currently stable on room air  Uncontrolled DM2 with hyperglycemia -03/10/19 A1C--11.7 -added lantus 10 units -continue novolog ISS  Chronic systolic and diastolic CHF -Appears clinically euvolemic -03/11/2019 echo EF 30-35%, grade 1 DD  Coronary artery disease -Patient had recent NSTEMI -Continue aspirin and Plavix -No chest pain presently -Personally reviewed EKG--sinus rhythm, nonspecific T wave change  Cocaine and Tobacco Abuse -cessation discussed       Disposition Plan: Patient From: Home D/C Place:  SNF - 1-2 Days Barriers: Not Clinically Stable--remains on IV antibiotics with sepsis and soft BPs; cocaine use  Family Communication: No Family at bedside  Consultants:none  Code Status: FULL   DVT Prophylaxis: [place] Heparin   Procedures: As Listed in Progress Note Above  Antibiotics: vanco 5/4>>> Cefepime 5/4>>>     Subjective: Pt states left groin is still hurting, but it is improving.  Denies f/c, cp,  N/v/d.  He has some sob, dry cough.  No hemoptysis.    Objective: Vitals:   05/17/19 1000 05/17/19 1100 05/17/19 1200 05/17/19 1400  BP: 118/71     Pulse: (!) 103 94  87  Resp: (!) 37 (!) 25  (!) 23  Temp:   98.3 F (36.8 C)   TempSrc:   Oral   SpO2: 94% 95%  95%  Weight:      Height:        Intake/Output Summary (Last 24 hours) at 05/17/2019 1525 Last data filed at 05/17/2019 [id number] Gross per 24 hour  Intake 1322.55 ml  Output 3325 ml  Net -2002.45 ml   Weight change: 2.5 kg Exam:   General:  Pt is alert, follows commands appropriately, not in acute distress  HEENT: No icterus, No thrush, No neck mass, [place]/AT  Cardiovascular: RRR, S1/S2, no rubs, no gallops  Respiratory: bibasilar crackles.  Bibasilar wheeze  Abdomen: Soft/+BS, non tender, non distended, no guarding  Extremities: No edema, No lymphangitis, No petechiae, mild erythema of left groin without necrosis or drainage   Data Reviewed: I have personally reviewed following labs and imaging studies Basic Metabolic Panel: Recent Labs  Lab 05/14/19 1204 05/15/19 0505 05/16/19 0410 05/17/19 0407  NA 137 143 136 137  K 4.1 3.9 4.0 3.8  CL 97* 115* 106 104  CO2 22 19* 20* 21*  GLUCOSE 246* 117* 252* 153*  BUN 37* 31* 24* 20  CREATININE 2.95* 2.18* 1.74* 1.49*  CALCIUM 8.4* 7.7* 7.7* 8.0*   Liver Function Tests: Recent Labs  Lab 05/14/19 1204 05/16/19 0410  AST 24 27  ALT 19 21  ALKPHOS 77 72  BILITOT 0.9 1.0  PROT 7.6 6.5  ALBUMIN 3.0* 2.2*   No results for input(s):  LIPASE, AMYLASE in the last 168 hours. No results for input(s): AMMONIA in the last 168 hours. Coagulation Profile: Recent Labs  Lab 05/14/19 1242  INR 2.0*   CBC: Recent Labs  Lab 05/14/19 1204 05/15/19 0505 05/16/19 0410  WBC 21.7* 17.1* 15.1*  NEUTROABS 18.5*  --   --   HGB 13.5 12.9* 12.4*  HCT 44.2 41.7 39.3  MCV 85.7 84.4 82.9  PLT 290 253 255   Cardiac Enzymes: No results for input(s): CKTOTAL, CKMB, CKMBINDEX, TROPONINI in the last 168 hours. BNP: Invalid input(s): POCBNP CBG: Recent Labs  Lab 05/16/19 1115 05/16/19 1617 05/16/19 2118 05/17/19 0820 05/17/19 1135  GLUCAP 209* 228* 147* 139* 167*   HbA1C: Recent Labs    05/16/19 0410  HGBA1C 9.6*   Urine analysis:    Component Value Date/Time   COLORURINE YELLOW 05/14/2019 1157   APPEARANCEUR HAZY (A) 05/14/2019 1157   LABSPEC 1.013 05/14/2019 1157   PHURINE 5.0 05/14/2019 1157   GLUCOSEU >=500 (A) 05/14/2019 1157   HGBUR MODERATE (A) 05/14/2019 1157   BILIRUBINUR NEGATIVE 05/14/2019 1157   KETONESUR NEGATIVE 05/14/2019 1157   PROTEINUR 30 (A) 05/14/2019 1157   UROBILINOGEN 0.2 09/27/2014 2237   NITRITE NEGATIVE 05/14/2019 1157   LEUKOCYTESUR NEGATIVE 05/14/2019 1157   Sepsis Labs: @LABRCNTIP (procalcitonin:4,lacticidven:4) ) Recent Results (from the past 240 hour(s))  Urine culture     Status: None   Collection Time: 05/14/19 11:57 AM   Specimen:  In/Out Cath Urine  Result Value Ref Range Status   Specimen Description   Final    IN/OUT CATH URINE Performed at [facility], [address] [place], [place] [address]    Special Requests   Final    NONE Performed at [facility], [address] [place], [place] [address]    Culture   Final    NO GROWTH Performed at [facility], [place] [address], [place], [place] [address]    Report Status 05/15/2019 FINAL  Final  Respiratory Panel by RT PCR (Flu A&B, Covid) - Nasopharyngeal Swab     Status: None   Collection Time: 05/14/19 12:00 PM    Specimen: Nasopharyngeal Swab  Result Value Ref Range Status   SARS Coronavirus 2 by RT PCR NEGATIVE NEGATIVE Final    Comment: (NOTE) SARS-CoV-2 target nucleic acids are NOT DETECTED. The SARS-CoV-2 RNA is generally detectable in upper respiratoy specimens during the acute phase of infection. The lowest concentration of SARS-CoV-2 viral copies this assay can detect is 131 copies/mL. A negative result does not preclude SARS-Cov-2 infection and should not be used as the sole basis for treatment or other patient management decisions. A negative result may occur with  improper specimen collection/handling, submission of specimen other than nasopharyngeal swab, presence of viral mutation(s) within the areas targeted by this assay, and inadequate number of viral copies (<131 copies/mL). A negative result must be combined with clinical observations, patient history, and epidemiological information. The expected result is Negative. Fact Sheet for Patients:  PinkCheek.be Fact Sheet for Healthcare Providers:  GravelBags.it This test is not yet ap proved or cleared by the [place] FDA and  has been authorized for detection and/or diagnosis of SARS-CoV-2 by FDA under an Emergency Use Authorization (EUA). This EUA will remain  in effect (meaning this test can be used) for the duration of the COVID-19 declaration under Section 564(b)(1) of the Act, 21 U.S.C. section 360bbb-3(b)(1), unless the authorization is terminated or revoked sooner.    Influenza A by PCR NEGATIVE NEGATIVE Final   Influenza B by PCR NEGATIVE NEGATIVE Final    Comment: (NOTE) The Xpert Xpress SARS-CoV-2/FLU/RSV assay is intended as an aid in  the diagnosis of influenza from Nasopharyngeal swab specimens and  should not be used as a sole basis for treatment. Nasal washings and  aspirates are unacceptable for Xpert Xpress SARS-CoV-2/FLU/RSV  testing. Fact Sheet  for Patients: PinkCheek.be Fact Sheet for Healthcare Providers: GravelBags.it This test is not yet approved or cleared by the [place] FDA and  has been authorized for detection and/or diagnosis of SARS-CoV-2 by  FDA under an Emergency Use Authorization (EUA). This EUA will remain  in effect (meaning this test can be used) for the duration of the  Covid-19 declaration under Section 564(b)(1) of the Act, 21  U.S.C. section 360bbb-3(b)(1), unless the authorization is  terminated or revoked. Performed at [facility], [address], [place], [place] [address]   Blood Culture (routine x 2)     Status: None (Preliminary result)   Collection Time: 05/14/19 12:12 PM   Specimen: BLOOD  Result Value Ref Range Status   Specimen Description BLOOD DRAWN BY IV THERAPY  Final   Special Requests   Final    BOTTLES DRAWN AEROBIC AND ANAEROBIC Blood Culture adequate volume   Culture   Final    NO GROWTH 3 DAYS Performed at [facility], [address] [place], [place] [address]    Report Status PENDING  Incomplete  Blood Culture (routine x 2)     Status: None (Preliminary result)   Collection Time: 05/14/19 12:20 PM   Specimen: BLOOD RIGHT HAND  Result Value Ref Range Status   Specimen Description BLOOD RIGHT HAND  Final   Special Requests   Final    BOTTLES DRAWN AEROBIC AND ANAEROBIC Blood Culture results may not be optimal due to an inadequate volume of blood received in culture bottles   Culture   Final    NO GROWTH 3 DAYS Performed at [facility], [address], [place], [place] [address]    Report Status PENDING  Incomplete  MRSA PCR Screening     Status: None   Collection Time: 05/14/19 12:40 PM   Specimen: Nasal Mucosa; Nasopharyngeal  Result Value Ref Range Status   MRSA by PCR NEGATIVE NEGATIVE Final    Comment:        The GeneXpert MRSA Assay (FDA approved for NASAL specimens only), is one component of a  comprehensive MRSA colonization surveillance program. It is not intended to diagnose MRSA infection nor to guide or monitor treatment for MRSA infections. Performed at [facility], [address], [place], [place] [address]   Culture, blood (Routine X 2) w Reflex to ID Panel     Status: None (Preliminary result)   Collection Time: 05/15/19  5:11 PM   Specimen: BLOOD RIGHT WRIST  Result Value Ref Range Status   Specimen Description BLOOD RIGHT WRIST  Final   Special Requests   Final    BOTTLES DRAWN AEROBIC AND ANAEROBIC Blood Culture adequate volume   Culture   Final    NO GROWTH 2 DAYS Performed at [facility], [address] [place], [place] [address]    Report Status PENDING  Incomplete  Culture, blood (Routine X 2) w Reflex to ID Panel     Status: None (Preliminary result)   Collection Time: 05/15/19  5:11 PM   Specimen: BLOOD RIGHT ARM  Result Value Ref Range Status   Specimen Description BLOOD RIGHT ARM  Final   Special Requests   Final    BOTTLES DRAWN AEROBIC AND ANAEROBIC Blood Culture adequate volume   Culture   Final    NO GROWTH 2 DAYS Performed at [facility], [address], [place], [place] [address]    Report Status PENDING  Incomplete     Scheduled Meds: . aspirin EC  81 mg Oral Daily  . Chlorhexidine Gluconate Cloth  6 each Topical Daily  . clopidogrel  75 mg Oral Q breakfast  . heparin  5,000 Units Subcutaneous Q8H  . insulin aspart  0-15 Units Subcutaneous TID WC  . insulin aspart  0-5 Units Subcutaneous QHS  . insulin glargine  10 Units Subcutaneous Daily   Continuous Infusions: . sodium chloride 50 mL/hr at 05/17/19 0812  . ceFEPime (MAXIPIME) IV 2 g (05/17/19 1303)  . vancomycin Stopped (05/16/19 1831)    Procedures/Studies: CT Abdomen Pelvis Wo Contrast  Result Date: 05/14/2019 CLINICAL DATA:  Soft tissue infection suspected, pelvis. EXAM: CT ABDOMEN AND PELVIS WITHOUT CONTRAST TECHNIQUE: Multidetector CT imaging of the abdomen and pelvis  was performed following the standard protocol without IV contrast. COMPARISON:  CTA of the abdomen and pelvis /5/20. CT stone study 11/24/2016. FINDINGS: Lower chest: Areas of dependent airspace disease are present in the medial lower lobes bilaterally, left greater than right. Atherosclerotic calcifications present the coronary arteries. Heart size is normal. Hepatobiliary: Gallbladder is mostly collapsed. Liver is unremarkable. Common bile  duct is within normal limits. Pancreas: Unremarkable. No pancreatic ductal dilatation or surrounding inflammatory changes. Spleen: Normal in size without focal abnormality. Adrenals/Urinary Tract: Adrenal glands are normal bilaterally. There is some stranding about both kidneys. No obstruction is present. No mass lesion or stone is evident. Atherosclerotic changes are present. Ureters are within normal limits. Urinary bladder is unremarkable. Stomach/Bowel: Stomach and duodenum are within normal limits. Small bowel is unremarkable. Terminal ileum is normal. Ascending transverse colon are normal. Descending and sigmoid colon are normal. Vascular/Lymphatic: Extensive atherosclerotic calcifications are again noted. Left iliac stent is in place. Aneurysmal dilation is present. 7 and 8 mm left inguinal lymph nodes are slightly more prominent than the prior exam. Left femoral lymph nodes have increased in size, likely reactive. Reproductive: Prostate calcifications are present without significant large mint or focal lesion. Other: Skin thickening and extensive subcutaneous edematous changes are present the medial upper left thigh extending into the inguinal region. No discrete abscess or free air is present. No superior extension is present. A well-defined subcutaneous low-density collection along the medial left gluteal cleft measures 8.3 x 7.6 x 25.6 mm. No significant stranding is associated. Musculoskeletal: Progressive disc disease is present at L5-S1 with central and bilateral  foraminal stenosis. Vacuum disc is now present. Broad-based disc protrusion is also present at L3-4 and L4-5. IMPRESSION: 1. Extensive inflammatory changes the left inguinal region extending into the anterior and medial upper thigh compatible with acute cellulitis. No associated abscess is present. 2. Associated reactive adenopathy. 3. Ascending inflammatory changes. 4. Low-density left perirectal fluid collection measuring up to 25 mm. No associated inflammatory changes are present. This may represent an early or healing perirectal abscess. The area should be amenable to direct visualization. 5. Progressive lower lumbar disc disease. Electronically Signed   By: San Morelle M.D.   On: 05/14/2019 14:29   CT HEAD WO CONTRAST  Result Date: 05/14/2019 CLINICAL DATA:  Encephalopathy, seizures EXAM: CT HEAD WITHOUT CONTRAST TECHNIQUE: Contiguous axial images were obtained from the base of the skull through the vertex without intravenous contrast. COMPARISON:  10/15/2018 FINDINGS: Brain: There is new, age-indeterminate cortical based hypodensity of the posterior right occipital lobe (series 2, image 13). Extensive periventricular and deep white matter hypodensity. Vascular: No hyperdense vessel or unexpected calcification. Skull: Normal. Negative for fracture or focal lesion. Sinuses/Orbits: No acute finding. Other: None. IMPRESSION: 1. There is new cortical based hypodensity of the posterior right occipital lobe, consistent with infarction although age indeterminate, by appearance subacute to chronic. Consider MRI to further evaluate for acute diffusion restricting infarction. 2.  Small-vessel white matter disease. Electronically Signed   By: Eddie Candle M.D.   On: 05/14/2019 17:45   CT PELVIS WO CONTRAST  Result Date: 05/15/2019 CLINICAL DATA:  Myositis worrisome for infection. Possible necrotizing infection left groin/hip region. EXAM: CT PELVIS WITHOUT CONTRAST TECHNIQUE: Multidetector CT imaging of the  pelvis was performed following the standard protocol without intravenous contrast. COMPARISON:  CT 05/14/2019 FINDINGS: Urinary Tract: Visualized ureters and bladder are normal. Visualized lower poles of the kidneys unchanged. Bowel:  Visualized small bowel and colon are unremarkable. Vascular/Lymphatic: Calcified plaque over the abdominal aorta which is otherwise normal in caliber. Slight increased number of small left superficial inguinal lymph nodes which are unchanged and likely reactive. No change in a few small left external iliac lymph nodes. No other adenopathy. Reproductive:  Normal. Other: Stable subcutaneous edema over the soft tissues of the left mid to lower inguinal region extending towards the medial upper  thigh just superficial to the abductor muscle group. Mild focal fluid over the medial upper thigh unchanged, although no definite subcutaneous abscess. These findings are unchanged and compatible rib patient's known soft tissue infection. No evidence of air within the soft tissues. No evidence of involvement of the underlying muscle compartment. No extension to the perianal/perirectal space. Musculoskeletal: Unchanged. IMPRESSION: 1. Subcutaneous edema and soft tissue changes over the mid to lower left inguinal region which are unchanged and compatible with patient's known soft tissue infection. No evidence of air in the soft tissues. No definite subcutaneous abscess. 2.  Aortic Atherosclerosis (ICD10-I70.0). Electronically Signed   By: Marin Olp M.D.   On: 05/15/2019 12:09   MR BRAIN WO CONTRAST  Result Date: 05/15/2019 CLINICAL DATA:  Abnormal CT brain, seizure, encephalopathy. Additional provided: Weakness and confusion. EXAM: MRI HEAD WITHOUT CONTRAST TECHNIQUE: Multiplanar, multiecho pulse sequences of the brain and surrounding structures were obtained without intravenous contrast. COMPARISON:  Noncontrast head CT 05/14/2019, head CT 10/15/2018 FINDINGS: Brain: The patient was unable to  tolerate the full examination. Only a sagittal T1 weighted sequence, coronal diffusion-weighted sequence, axial diffusion-weighted sequence, axial T2 weighted sequence, axial T2/FLAIR sequence and axial T2* sequence could be obtained. Multiple sequences are significantly motion degraded. Most notably, there is moderate motion degradation of the sagittal T1 weighted sequence, moderate/severe motion degradation of the coronal diffusion-weighted sequence, moderate/severe motion degradation of the axial T2 weighted sequence, severe motion degradation of the axial T2/FLAIR sequence and severe motion degradation of the axial T2* sequence. This significantly limits evaluation. There is an incompletely assessed focus of T2/FLAIR hyperintensity within the right occipital lobe. Suggestion of volume loss at this site on axial T2 weighted imaging. There is some diffusion-weighted hyperintensity at this site without definite ADC map correlate (series 7, image 19). Incompletely assessed chronic small vessel ischemic disease with chronic lacunar infarcts in the bilateral cerebral white matter, basal ganglia, thalami and cerebellar hemispheres. There is no midline shift. No extra-axial fluid collection is identified. Severe motion degradation of the axial T2* sequence precludes adequate evaluation for chronic intracranial blood products. Mild generalized parenchymal atrophy. Vascular: Expected proximal arterial flow voids. Skull and upper cervical spine: No focal marrow lesion is identified within the limitations of motion degradation. Sinuses/Orbits: Visualized orbits show no acute finding. Mild ethmoid sinus mucosal thickening. Small left maxillary sinus mucous retention cysts. No significant mastoid effusion. IMPRESSION: Significantly motion degraded, prematurely terminated and limited examination as described. Incompletely assessed focus of T2/FLAIR hyperintensity within the right occipital lobe with suggestion of volume loss  at this site. There is some diffusion-weighted hyperintensity at this site, although the without definite ADC correlate. Overall, the appearance on today's exam and on prior head CT are suggestive of chronic infarct. However, given significant motion degradation, a repeat noncontrast MRI is recommended for confirmation when the patient is better able to tolerate the study. Chronic small vessel ischemic disease with multiple chronic lacunar infarcts as described. Mild generalized parenchymal atrophy. Electronically Signed   By: Kellie Simmering DO   On: 05/15/2019 14:13   DG Chest Port 1 View  Result Date: 05/14/2019 CLINICAL DATA:  Fever.  Overdose. EXAM: PORTABLE CHEST 1 VIEW COMPARISON:  03/10/2019. FINDINGS: Cardiomegaly. Bilateral mild interstitial prominence. Tiny bilateral pleural effusions can not be excluded. Mild CHF cannot be excluded. No pneumothorax. Degenerative change thoracic spine. IMPRESSION: Cardiomegaly. Mild bilateral interstitial prominence. Tiny bilateral pleural effusions can not be excluded. Mild CHF cannot be excluded. Electronically Signed   By: Marcello Moores  Register  On: 05/14/2019 12:23   EEG adult  Result Date: 05/15/2019 Lora Havens, MD     05/16/2019  9:20 AM Patient Name: [name] MRN: [id number] Epilepsy Attending: Lora Havens Referring Physician/Provider: Dr. [name] Shiah Berhow Date: 05/15/2019 Duration: 24.03 mins Patient history: 64 year old male with altered mental status.  EEG evaluate for seizures. Level of alertness: Awake, sleep AEDs during EEG study: None Technical aspects: This EEG study was done with scalp electrodes positioned according to the 10-20 International system of electrode placement. Electrical activity was acquired at a sampling rate of 500Hz  and reviewed with a high frequency filter of 70Hz  and a low frequency filter of 1Hz . EEG data were recorded continuously and digitally stored. Description: The posterior dominant rhythm consists of 8 Hz activity of  moderate voltage (25-35 uV) seen predominantly in posterior head regions, symmetric and reactive to eye opening and eye closing.  Sleep was characterized by vertex waves, sleep spindles (12 to 14 Hz), maximal frontocentral region.  Intermittent generalized 3 to 5 Hz theta-delta slowing was also noted.  Hyperventilation and photic stimulation were not performed. Abnormality -Intermittent slow, generalized IMPRESSION: This study is suggestive of mild diffuse encephalopathy, nonspecific to etiology. No seizures or epileptiform discharges were seen throughout the recording. [place], DO  [facility]  If 7PM-7AM, please contact night-coverage [email or web address] Password [email or web address] 05/17/2019, 3:25 PM   LOS: 3 days

## 2019-05-18 ENCOUNTER — Inpatient Hospital Stay (HOSPITAL_COMMUNITY): Payer: No Typology Code available for payment source

## 2019-05-18 LAB — CBC
HCT: 41.2 % (ref 39.0–52.0)
Hemoglobin: 13.3 g/dL (ref 13.0–17.0)
MCH: 25.7 pg — ABNORMAL LOW (ref 26.0–34.0)
MCHC: 32.3 g/dL (ref 30.0–36.0)
MCV: 79.7 fL — ABNORMAL LOW (ref 80.0–100.0)
Platelets: 285 10*3/uL (ref 150–400)
RBC: 5.17 MIL/uL (ref 4.22–5.81)
RDW: 16.5 % — ABNORMAL HIGH (ref 11.5–15.5)
WBC: 19.1 10*3/uL — ABNORMAL HIGH (ref 4.0–10.5)
nRBC: 0.3 % — ABNORMAL HIGH (ref 0.0–0.2)

## 2019-05-18 LAB — GLUCOSE, CAPILLARY
Glucose-Capillary: 181 mg/dL — ABNORMAL HIGH (ref 70–99)
Glucose-Capillary: 252 mg/dL — ABNORMAL HIGH (ref 70–99)
Glucose-Capillary: 92 mg/dL (ref 70–99)
Glucose-Capillary: 167 mg/dL — ABNORMAL HIGH (ref 70–99)

## 2019-05-18 LAB — BASIC METABOLIC PANEL
Anion gap: 10 (ref 5–15)
BUN: 23 mg/dL (ref 8–23)
CO2: 23 mmol/L (ref 22–32)
Calcium: 7.6 mg/dL — ABNORMAL LOW (ref 8.9–10.3)
Chloride: 101 mmol/L (ref 98–111)
Creatinine, Ser: 1.21 mg/dL (ref 0.61–1.24)
GFR calc Af Amer: >60 mL/min (ref >60)
GFR calc non Af Amer: >60 mL/min (ref >60)
Glucose, Bld: 179 mg/dL — ABNORMAL HIGH (ref 70–99)
Potassium: 3.5 mmol/L (ref 3.5–5.1)
Sodium: 134 mmol/L — ABNORMAL LOW (ref 135–145)

## 2019-05-18 LAB — TROPONIN I (HIGH SENSITIVITY)
Troponin I (High Sensitivity): 278 ng/L (ref <18)
Troponin I (High Sensitivity): 298 ng/L (ref <18)

## 2019-05-18 LAB — MAGNESIUM: Magnesium: 2.1 mg/dL (ref 1.7–2.4)

## 2019-05-18 MED ORDER — SUFENTANIL CITRATE 50 MCG/ML IV SOLN
INTRAVENOUS | Status: AC
  Filled 2019-05-18: qty 1

## 2019-05-18 MED ORDER — HEPARIN SODIUM (PORCINE) 5000 UNIT/ML IJ SOLN
5000.0000 [IU] | Freq: Three times a day (TID) | INTRAMUSCULAR | Status: DC
  Administered 2019-05-18 – 2019-05-22 (×10): 5000 [IU] via SUBCUTANEOUS
  Filled 2019-05-18 (×10): qty 1

## 2019-05-18 MED ORDER — MIDAZOLAM HCL 2 MG/2ML IJ SOLN
INTRAMUSCULAR | Status: AC
  Filled 2019-05-18: qty 2

## 2019-05-18 MED ORDER — PROPOFOL 10 MG/ML IV BOLUS
INTRAVENOUS | Status: AC
  Filled 2019-05-18: qty 20

## 2019-05-18 MED ORDER — ASPIRIN 81 MG PO CHEW
81.0000 mg | CHEWABLE_TABLET | Freq: Every day | ORAL | Status: DC
  Administered 2019-05-19 – 2019-05-27 (×8): 81 mg via ORAL
  Filled 2019-05-18 (×9): qty 1

## 2019-05-18 MED ORDER — MORPHINE SULFATE (PF) 2 MG/ML IV SOLN
2.0000 mg | Freq: Once | INTRAVENOUS | Status: AC
  Administered 2019-05-18: 2 mg via INTRAVENOUS
  Filled 2019-05-18: qty 1

## 2019-05-18 MED ORDER — SODIUM CHLORIDE 0.9 % IV SOLN
2.0000 g | Freq: Three times a day (TID) | INTRAVENOUS | Status: DC
  Administered 2019-05-18 – 2019-05-24 (×18): 2 g via INTRAVENOUS
  Filled 2019-05-18 (×20): qty 2

## 2019-05-18 MED ORDER — KETOROLAC TROMETHAMINE 15 MG/ML IJ SOLN
15.0000 mg | Freq: Once | INTRAMUSCULAR | Status: AC
  Administered 2019-05-18: 15 mg via INTRAVENOUS
  Filled 2019-05-18: qty 1

## 2019-05-18 MED ORDER — SODIUM CHLORIDE 0.9 % IV SOLN
INTRAVENOUS | Status: DC | PRN
  Administered 2019-05-18: 250 mL via INTRAVENOUS

## 2019-05-18 MED ORDER — METRONIDAZOLE IN NACL 5-0.79 MG/ML-% IV SOLN
500.0000 mg | Freq: Three times a day (TID) | INTRAVENOUS | Status: DC
  Administered 2019-05-18 – 2019-05-24 (×18): 500 mg via INTRAVENOUS
  Filled 2019-05-18 (×17): qty 100

## 2019-05-18 NOTE — Progress Notes (Addendum)
PROGRESS NOTE  DIYOR MCLEISH [id number] DOB: 10-04-1955 DOA: 05/14/2019 PCP: System, Pcp Not In  Brief History: 64 year old male with a history of coronary artery disease and recent NSTEMI, diabetes mellitus type 2, systolic CHF, CKD stage III, hypertension, cocaine abuse, peripheral vascular disease, apical mural thrombus presenting with altered mental status and generalized weakness. Apparently, the patient was in the bathtub trying to soak his left groin area that has been bothering him for the past 2 weeks. He had difficulty getting out of the tub because of generalized weakness. His girlfriend also noted him to be lethargic. Upon EMS arrival, there was concern of seizure activity and the patient was given Versed and 500 mL fluid bolus. He was somnolent at the time. In the emergency department, the patient was awakening to voice, but drifted back to sleep. Apparently the patient took 3 doses of tramadol because of his left groin pain. He had denied any chest pain, shortness of breath, nausea, vomiting, diarrhea, rectal pain. There is no dysuria or hematuria.  Recent hospitalization2/28 - 3/2/21under cardiology service for NSTEMI, troponin peaking at 575, requiring PCI of RCA/PDA,via cardiac cath3/1/21.On dual antiplatelets Plavix and aspirin. Echocardiogram also showed apical filling defect consistent with thrombus/early thrombus formation. Plan was to continue aspirin and Plavix for 30 days, to discontinue aspirin then and addXarelto and continue Plavix long-term.Echo showed EF of 30 to 35%, due to history of intermittent compliance, was felt not to be a candidate for ICD. Unless he could demonstrate compliance with medications and follow-up in the future.  On the morning of 05/15/2019, the patient was alert and oriented x3. He continued to complain of pain in his left groin. He continued to have soft blood pressures in the ICU.  These gradually improved and he  was moved out of the ICU on 05/18/19.  Assessment/Plan: Sepsis -Secondary to left groin/inguinal cellulitis -05/14/2019 CT abdomen/pelvis--extensive inflammatory changes in the left inguinal region extending into the anterior and medial upper thigh. There is no drainable abscess. There is a left perirectal fluid collection without inflammatory changes. -5/5 CT pelvis--subQ edema and soft tissue edema mid-lower left inguinal region unchanged--no air or fluid collection in soft tissues -Continue vancomycin and cefepime -Follow blood cultures--neg to date -UA negative for pyuria  Left groin cellulitis -Continue vancomycin and cefepime -remains indurated with significant rubor and pain -WBC going up -new fluctuant area on exam -repeat CT abd/pelvis  Perirectal fluid collection -General surgery consultappreciated -gluteal collection is not clinically infected and does not need drained.  Acute on chronic renal failure--CKD stage IIIb -Baseline creatinine 1.4-1.6 -Presented with serum creatinine 2.95 -Secondary to sepsis and hemodynamic changes -ContinuejudiciousIV fluids-->saline lock  Acute metabolic encephalopathy -due to toxic ingestion (cocaine, tramadol) and sepsis -Obtain EEG--neg for epileptiform discharges -MRI brain--motion degraded but neg for acute infarct -05/15/2019 CT brain--age-indeterminate hypodensity right posterior occipital lobe -mental status back to baseline  Acute respiratory failure with hypoxia -Likely secondary to hypoventilationand underlying COPD -Currently stable on room air  Uncontrolled DM2 with hyperglycemia -03/10/19 A1C--11.7 -added lantus 10 units -continue novolog ISS  Chronic systolic and diastolic CHF -Appears clinically euvolemic -03/11/2019 echo EF 30-35%, grade 1 DD  Coronary artery disease -Patient had recent NSTEMI -Continue aspirin and Plavix -No chest pain presently -Personally reviewed EKG--sinus rhythm, nonspecific T  wave change -5/8--pt had chest pain last night-->check troponins, EKG  Cocaine and Tobacco Abuse -cessation discussed       Disposition Plan: Patient From: Home  D/C Place: SNF - 2-3 Days Barriers: Not Clinically Stable--remains on IV antibiotics with sepsis and WBC rising;  New chest pain  Family Communication: No Family at bedside  Consultants:none  Code Status: FULL   DVT Prophylaxis: [place] Heparin   Procedures: As Listed in Progress Note Above  Antibiotics: vanco 5/4>>> Cefepime 5/4>>>       Subjective: Pt states overall pain is stable in left groin.  He denies f/c, n/v/d.  He had substernal cp last night lasting 5-10 min, moderate with sob.  Objective: Vitals:   05/18/19 0732 05/18/19 0800 05/18/19 0917 05/18/19 1000  BP:  120/84    Pulse: 91 83  89  Resp: (!) 35 (!) 26 14 20   Temp: (!) 97.5 F (36.4 C)     TempSrc: Oral     SpO2: 95% 95%  95%  Weight:      Height:        Intake/Output Summary (Last 24 hours) at 05/18/2019 1245 Last data filed at 05/18/2019 0600 Gross per 24 hour  Intake 2062.74 ml  Output 900 ml  Net 1162.74 ml   Weight change: 0.9 kg Exam:   General:  Pt is alert, follows commands appropriately, not in acute distress  HEENT: No icterus, No thrush, No neck mass, /AT  Cardiovascular: RRR, S1/S2, no rubs, no gallops  Respiratory: bibasilar crackles. No wheeze  Abdomen: Soft/+BS, LLQ tender, non distended, no guarding  Extremities: No edema, see pic of left groin below     Data Reviewed: I have personally reviewed following labs and imaging studies Basic Metabolic Panel: Recent Labs  Lab 05/14/19 1204 05/15/19 0505 05/16/19 0410 05/17/19 0407 05/18/19 0448  NA 137 143 136 137 134*  K 4.1 3.9 4.0 3.8 3.5  CL 97* 115* 106 104 101  CO2 22 19* 20* 21* 23  GLUCOSE 246* 117* 252* 153* 179*  BUN 37* 31* 24* 20 23  CREATININE 2.95* 2.18* 1.74* 1.49* 1.21  CALCIUM 8.4* 7.7* 7.7* 8.0* 7.6*  MG   --   --   --   --  2.1   Liver Function Tests: Recent Labs  Lab 05/14/19 1204 05/16/19 0410  AST 24 27  ALT 19 21  ALKPHOS 77 72  BILITOT 0.9 1.0  PROT 7.6 6.5  ALBUMIN 3.0* 2.2*   No results for input(s): LIPASE, AMYLASE in the last 168 hours. No results for input(s): AMMONIA in the last 168 hours. Coagulation Profile: Recent Labs  Lab 05/14/19 1242  INR 2.0*   CBC: Recent Labs  Lab 05/14/19 1204 05/15/19 0505 05/16/19 0410 05/18/19 0448  WBC 21.7* 17.1* 15.1* 19.1*  NEUTROABS 18.5*  --   --   --   HGB 13.5 12.9* 12.4* 13.3  HCT 44.2 41.7 39.3 41.2  MCV 85.7 84.4 82.9 79.7*  PLT 290 253 255 285   Cardiac Enzymes: No results for input(s): CKTOTAL, CKMB, CKMBINDEX, TROPONINI in the last 168 hours. BNP: Invalid input(s): POCBNP CBG: Recent Labs  Lab 05/17/19 1135 05/17/19 1553 05/17/19 2005 05/18/19 0731 05/18/19 1101  GLUCAP 167* 179* 211* 181* 252*   HbA1C: Recent Labs    05/16/19 0410  HGBA1C 9.6*   Urine analysis:    Component Value Date/Time   COLORURINE YELLOW 05/14/2019 1157   APPEARANCEUR HAZY (A) 05/14/2019 1157   LABSPEC 1.013 05/14/2019 1157   PHURINE 5.0 05/14/2019 1157   GLUCOSEU >=500 (A) 05/14/2019 1157   HGBUR MODERATE (A) 05/14/2019 [place] 05/14/2019 1157   KETONESUR  NEGATIVE 05/14/2019 1157   PROTEINUR 30 (A) 05/14/2019 1157   UROBILINOGEN 0.2 09/27/2014 2237   NITRITE NEGATIVE 05/14/2019 [place] 05/14/2019 1157   Sepsis Labs: @LABRCNTIP (procalcitonin:4,lacticidven:4) ) Recent Results (from the past 240 hour(s))  Urine culture     Status: None   Collection Time: 05/14/19 11:57 AM   Specimen: In/Out Cath Urine  Result Value Ref Range Status   Specimen Description   Final    IN/OUT CATH URINE Performed at [facility], [address] [place], [place] [address]    Special Requests   Final    NONE Performed at [facility], [address], [place], [place] [address]    Culture    Final    NO GROWTH Performed at [facility], [place] [address], [place], [place] [address]    Report Status 05/15/2019 FINAL  Final  Respiratory Panel by RT PCR (Flu A&B, Covid) - Nasopharyngeal Swab     Status: None   Collection Time: 05/14/19 12:00 PM   Specimen: Nasopharyngeal Swab  Result Value Ref Range Status   SARS Coronavirus 2 by RT PCR NEGATIVE NEGATIVE Final    Comment: (NOTE) SARS-CoV-2 target nucleic acids are NOT DETECTED. The SARS-CoV-2 RNA is generally detectable in upper respiratoy specimens during the acute phase of infection. The lowest concentration of SARS-CoV-2 viral copies this assay can detect is 131 copies/mL. A negative result does not preclude SARS-Cov-2 infection and should not be used as the sole basis for treatment or other patient management decisions. A negative result may occur with  improper specimen collection/handling, submission of specimen other than nasopharyngeal swab, presence of viral mutation(s) within the areas targeted by this assay, and inadequate number of viral copies (<131 copies/mL). A negative result must be combined with clinical observations, patient history, and epidemiological information. The expected result is Negative. Fact Sheet for Patients:  PinkCheek.be Fact Sheet for Healthcare Providers:  GravelBags.it This test is not yet ap proved or cleared by the [place] FDA and  has been authorized for detection and/or diagnosis of SARS-CoV-2 by FDA under an Emergency Use Authorization (EUA). This EUA will remain  in effect (meaning this test can be used) for the duration of the COVID-19 declaration under Section 564(b)(1) of the Act, 21 U.S.C. section 360bbb-3(b)(1), unless the authorization is terminated or revoked sooner.    Influenza A by PCR NEGATIVE NEGATIVE Final   Influenza B by PCR NEGATIVE NEGATIVE Final    Comment: (NOTE) The Xpert Xpress  SARS-CoV-2/FLU/RSV assay is intended as an aid in  the diagnosis of influenza from Nasopharyngeal swab specimens and  should not be used as a sole basis for treatment. Nasal washings and  aspirates are unacceptable for Xpert Xpress SARS-CoV-2/FLU/RSV  testing. Fact Sheet for Patients: PinkCheek.be Fact Sheet for Healthcare Providers: GravelBags.it This test is not yet approved or cleared by the [place] FDA and  has been authorized for detection and/or diagnosis of SARS-CoV-2 by  FDA under an Emergency Use Authorization (EUA). This EUA will remain  in effect (meaning this test can be used) for the duration of the  Covid-19 declaration under Section 564(b)(1) of the Act, 21  U.S.C. section 360bbb-3(b)(1), unless the authorization is  terminated or revoked. Performed at [facility], [address], [place], [place] [address]   Blood Culture (routine x 2)     Status: None (Preliminary result)   Collection Time: 05/14/19 12:12 PM   Specimen: BLOOD  Result Value Ref Range Status  Specimen Description BLOOD DRAWN BY IV THERAPY  Final   Special Requests   Final    BOTTLES DRAWN AEROBIC AND ANAEROBIC Blood Culture adequate volume   Culture   Final    NO GROWTH 4 DAYS Performed at [facility], [address], [place], [place] [address]    Report Status PENDING  Incomplete  Blood Culture (routine x 2)     Status: None (Preliminary result)   Collection Time: 05/14/19 12:20 PM   Specimen: BLOOD RIGHT HAND  Result Value Ref Range Status   Specimen Description BLOOD RIGHT HAND  Final   Special Requests   Final    BOTTLES DRAWN AEROBIC AND ANAEROBIC Blood Culture results may not be optimal due to an inadequate volume of blood received in culture bottles   Culture   Final    NO GROWTH 4 DAYS Performed at [facility], [address], [place], [place] [address]    Report Status PENDING  Incomplete  MRSA PCR Screening     Status:  None   Collection Time: 05/14/19 12:40 PM   Specimen: Nasal Mucosa; Nasopharyngeal  Result Value Ref Range Status   MRSA by PCR NEGATIVE NEGATIVE Final    Comment:        The GeneXpert MRSA Assay (FDA approved for NASAL specimens only), is one component of a comprehensive MRSA colonization surveillance program. It is not intended to diagnose MRSA infection nor to guide or monitor treatment for MRSA infections. Performed at [facility], [address] [place], [place] [address]   Culture, blood (Routine X 2) w Reflex to ID Panel     Status: None (Preliminary result)   Collection Time: 05/15/19  5:11 PM   Specimen: BLOOD RIGHT WRIST  Result Value Ref Range Status   Specimen Description BLOOD RIGHT WRIST  Final   Special Requests   Final    BOTTLES DRAWN AEROBIC AND ANAEROBIC Blood Culture adequate volume   Culture   Final    NO GROWTH 3 DAYS Performed at [facility], [address], [place], [place] [address]    Report Status PENDING  Incomplete  Culture, blood (Routine X 2) w Reflex to ID Panel     Status: None (Preliminary result)   Collection Time: 05/15/19  5:11 PM   Specimen: BLOOD RIGHT ARM  Result Value Ref Range Status   Specimen Description BLOOD RIGHT ARM  Final   Special Requests   Final    BOTTLES DRAWN AEROBIC AND ANAEROBIC Blood Culture adequate volume   Culture   Final    NO GROWTH 3 DAYS Performed at [facility], [address] [place], [place] [address]    Report Status PENDING  Incomplete     Scheduled Meds: . Chlorhexidine Gluconate Cloth  6 each Topical Daily  . clopidogrel  75 mg Oral Q breakfast  . heparin  5,000 Units Subcutaneous Q8H  . insulin aspart  0-15 Units Subcutaneous TID WC  . insulin aspart  0-5 Units Subcutaneous QHS  . insulin glargine  10 Units Subcutaneous Daily  . polyethylene glycol  17 g Oral Daily  . rivaroxaban  20 mg Oral Q supper   Continuous Infusions: . sodium chloride 250 mL (05/18/19 1158)  . ceFEPime (MAXIPIME)  IV 2 g (05/18/19 1200)  . vancomycin 166.7 mL/hr at 05/17/19 1816    Procedures/Studies: CT Abdomen Pelvis Wo Contrast  Result Date: 05/14/2019 CLINICAL DATA:  Soft tissue infection suspected, pelvis. EXAM: CT ABDOMEN AND PELVIS WITHOUT CONTRAST TECHNIQUE: Multidetector  CT imaging of the abdomen and pelvis was performed following the standard protocol without IV contrast. COMPARISON:  CTA of the abdomen and pelvis /5/20. CT stone study 11/24/2016. FINDINGS: Lower chest: Areas of dependent airspace disease are present in the medial lower lobes bilaterally, left greater than right. Atherosclerotic calcifications present the coronary arteries. Heart size is normal. Hepatobiliary: Gallbladder is mostly collapsed. Liver is unremarkable. Common bile duct is within normal limits. Pancreas: Unremarkable. No pancreatic ductal dilatation or surrounding inflammatory changes. Spleen: Normal in size without focal abnormality. Adrenals/Urinary Tract: Adrenal glands are normal bilaterally. There is some stranding about both kidneys. No obstruction is present. No mass lesion or stone is evident. Atherosclerotic changes are present. Ureters are within normal limits. Urinary bladder is unremarkable. Stomach/Bowel: Stomach and duodenum are within normal limits. Small bowel is unremarkable. Terminal ileum is normal. Ascending transverse colon are normal. Descending and sigmoid colon are normal. Vascular/Lymphatic: Extensive atherosclerotic calcifications are again noted. Left iliac stent is in place. Aneurysmal dilation is present. 7 and 8 mm left inguinal lymph nodes are slightly more prominent than the prior exam. Left femoral lymph nodes have increased in size, likely reactive. Reproductive: Prostate calcifications are present without significant large mint or focal lesion. Other: Skin thickening and extensive subcutaneous edematous changes are present the medial upper left thigh extending into the inguinal region. No discrete  abscess or free air is present. No superior extension is present. A well-defined subcutaneous low-density collection along the medial left gluteal cleft measures 8.3 x 7.6 x 25.6 mm. No significant stranding is associated. Musculoskeletal: Progressive disc disease is present at L5-S1 with central and bilateral foraminal stenosis. Vacuum disc is now present. Broad-based disc protrusion is also present at L3-4 and L4-5. IMPRESSION: 1. Extensive inflammatory changes the left inguinal region extending into the anterior and medial upper thigh compatible with acute cellulitis. No associated abscess is present. 2. Associated reactive adenopathy. 3. Ascending inflammatory changes. 4. Low-density left perirectal fluid collection measuring up to 25 mm. No associated inflammatory changes are present. This may represent an early or healing perirectal abscess. The area should be amenable to direct visualization. 5. Progressive lower lumbar disc disease. Electronically Signed   By: San Morelle M.D.   On: 05/14/2019 14:29   CT HEAD WO CONTRAST  Result Date: 05/14/2019 CLINICAL DATA:  Encephalopathy, seizures EXAM: CT HEAD WITHOUT CONTRAST TECHNIQUE: Contiguous axial images were obtained from the base of the skull through the vertex without intravenous contrast. COMPARISON:  10/15/2018 FINDINGS: Brain: There is new, age-indeterminate cortical based hypodensity of the posterior right occipital lobe (series 2, image 13). Extensive periventricular and deep white matter hypodensity. Vascular: No hyperdense vessel or unexpected calcification. Skull: Normal. Negative for fracture or focal lesion. Sinuses/Orbits: No acute finding. Other: None. IMPRESSION: 1. There is new cortical based hypodensity of the posterior right occipital lobe, consistent with infarction although age indeterminate, by appearance subacute to chronic. Consider MRI to further evaluate for acute diffusion restricting infarction. 2.  Small-vessel white  matter disease. Electronically Signed   By: Eddie Candle M.D.   On: 05/14/2019 17:45   CT PELVIS WO CONTRAST  Result Date: 05/15/2019 CLINICAL DATA:  Myositis worrisome for infection. Possible necrotizing infection left groin/hip region. EXAM: CT PELVIS WITHOUT CONTRAST TECHNIQUE: Multidetector CT imaging of the pelvis was performed following the standard protocol without intravenous contrast. COMPARISON:  CT 05/14/2019 FINDINGS: Urinary Tract: Visualized ureters and bladder are normal. Visualized lower poles of the kidneys unchanged. Bowel:  Visualized small bowel and colon  are unremarkable. Vascular/Lymphatic: Calcified plaque over the abdominal aorta which is otherwise normal in caliber. Slight increased number of small left superficial inguinal lymph nodes which are unchanged and likely reactive. No change in a few small left external iliac lymph nodes. No other adenopathy. Reproductive:  Normal. Other: Stable subcutaneous edema over the soft tissues of the left mid to lower inguinal region extending towards the medial upper thigh just superficial to the abductor muscle group. Mild focal fluid over the medial upper thigh unchanged, although no definite subcutaneous abscess. These findings are unchanged and compatible rib patient's known soft tissue infection. No evidence of air within the soft tissues. No evidence of involvement of the underlying muscle compartment. No extension to the perianal/perirectal space. Musculoskeletal: Unchanged. IMPRESSION: 1. Subcutaneous edema and soft tissue changes over the mid to lower left inguinal region which are unchanged and compatible with patient's known soft tissue infection. No evidence of air in the soft tissues. No definite subcutaneous abscess. 2.  Aortic Atherosclerosis (ICD10-I70.0). Electronically Signed   By: Marin Olp M.D.   On: 05/15/2019 12:09   MR BRAIN WO CONTRAST  Result Date: 05/15/2019 CLINICAL DATA:  Abnormal CT brain, seizure, encephalopathy.  Additional provided: Weakness and confusion. EXAM: MRI HEAD WITHOUT CONTRAST TECHNIQUE: Multiplanar, multiecho pulse sequences of the brain and surrounding structures were obtained without intravenous contrast. COMPARISON:  Noncontrast head CT 05/14/2019, head CT 10/15/2018 FINDINGS: Brain: The patient was unable to tolerate the full examination. Only a sagittal T1 weighted sequence, coronal diffusion-weighted sequence, axial diffusion-weighted sequence, axial T2 weighted sequence, axial T2/FLAIR sequence and axial T2* sequence could be obtained. Multiple sequences are significantly motion degraded. Most notably, there is moderate motion degradation of the sagittal T1 weighted sequence, moderate/severe motion degradation of the coronal diffusion-weighted sequence, moderate/severe motion degradation of the axial T2 weighted sequence, severe motion degradation of the axial T2/FLAIR sequence and severe motion degradation of the axial T2* sequence. This significantly limits evaluation. There is an incompletely assessed focus of T2/FLAIR hyperintensity within the right occipital lobe. Suggestion of volume loss at this site on axial T2 weighted imaging. There is some diffusion-weighted hyperintensity at this site without definite ADC map correlate (series 7, image 19). Incompletely assessed chronic small vessel ischemic disease with chronic lacunar infarcts in the bilateral cerebral white matter, basal ganglia, thalami and cerebellar hemispheres. There is no midline shift. No extra-axial fluid collection is identified. Severe motion degradation of the axial T2* sequence precludes adequate evaluation for chronic intracranial blood products. Mild generalized parenchymal atrophy. Vascular: Expected proximal arterial flow voids. Skull and upper cervical spine: No focal marrow lesion is identified within the limitations of motion degradation. Sinuses/Orbits: Visualized orbits show no acute finding. Mild ethmoid sinus mucosal  thickening. Small left maxillary sinus mucous retention cysts. No significant mastoid effusion. IMPRESSION: Significantly motion degraded, prematurely terminated and limited examination as described. Incompletely assessed focus of T2/FLAIR hyperintensity within the right occipital lobe with suggestion of volume loss at this site. There is some diffusion-weighted hyperintensity at this site, although the without definite ADC correlate. Overall, the appearance on today's exam and on prior head CT are suggestive of chronic infarct. However, given significant motion degradation, a repeat noncontrast MRI is recommended for confirmation when the patient is better able to tolerate the study. Chronic small vessel ischemic disease with multiple chronic lacunar infarcts as described. Mild generalized parenchymal atrophy. Electronically Signed   By: Kellie Simmering DO   On: 05/15/2019 14:13   DG Chest Port 1 View  Result  Date: 05/14/2019 CLINICAL DATA:  Fever.  Overdose. EXAM: PORTABLE CHEST 1 VIEW COMPARISON:  03/10/2019. FINDINGS: Cardiomegaly. Bilateral mild interstitial prominence. Tiny bilateral pleural effusions can not be excluded. Mild CHF cannot be excluded. No pneumothorax. Degenerative change thoracic spine. IMPRESSION: Cardiomegaly. Mild bilateral interstitial prominence. Tiny bilateral pleural effusions can not be excluded. Mild CHF cannot be excluded. Electronically Signed   By: Marcello Moores  Register   On: 05/14/2019 12:23   EEG adult  Result Date: 05/15/2019 Lora Havens, MD     05/16/2019  9:20 AM Patient Name: DAJOHN ROSIE MRN: [id number] Epilepsy Attending: Lora Havens Referring Physician/Provider: Dr. [name] Lilton Pare Date: 05/15/2019 Duration: 24.03 mins Patient history: 64 year old male with altered mental status.  EEG evaluate for seizures. Level of alertness: Awake, sleep AEDs during EEG study: None Technical aspects: This EEG study was done with scalp electrodes positioned according to the 10-20  International system of electrode placement. Electrical activity was acquired at a sampling rate of 500Hz  and reviewed with a high frequency filter of 70Hz  and a low frequency filter of 1Hz . EEG data were recorded continuously and digitally stored. Description: The posterior dominant rhythm consists of 8 Hz activity of moderate voltage (25-35 uV) seen predominantly in posterior head regions, symmetric and reactive to eye opening and eye closing.  Sleep was characterized by vertex waves, sleep spindles (12 to 14 Hz), maximal frontocentral region.  Intermittent generalized 3 to 5 Hz theta-delta slowing was also noted.  Hyperventilation and photic stimulation were not performed. Abnormality -Intermittent slow, generalized IMPRESSION: This study is suggestive of mild diffuse encephalopathy, nonspecific to etiology. No seizures or epileptiform discharges were seen throughout the recording. [place], DO  [facility] Hospitalists  If 7PM-7AM, please contact night-coverage [email or web address] Password [email or web address] 05/18/2019, 12:45 PM   LOS: 4 days

## 2019-05-18 NOTE — Progress Notes (Addendum)
[facility]  Patient had been seen earlier this week and had been concerned about possible necrotizing infection then due to his diabetes and groin cellulitis and pain on exam but repeat imaging was reassuring with no signs of gas (although limited due to non contrast). At that time erythema was improving and leukocytosis going down, and this infection had been going on for 2 weeks.    I saw him this AM and felt some fluctuance forming but no crepitus and asked for a CT to r/o abscess that would now be drainable at the bedside.   Personally reviewed CT and it has extensive subcutaneous gas more concerning now for the necrotizing infection that I had been weary of initially.  The patient had a recent NSTEMI, and EF 30%.  We are not equipped at [facility] to handle extensive serial debridements and have no cardiology available outside of M-F 8-5.  Given this he cannot get surgery at [facility], which I had indicated in my initial consult.   Discussed with Dr. Carles Collet and he is going to transfer. I have discussed with Dr. Georganna Skeans, and he is aware of the patient.  Appreciate everyone's assistance.   Curlene Labrum, MD [facility] [address] [place], [place] [address] [phone] (office)

## 2019-05-18 NOTE — Progress Notes (Signed)
Report given to care link and report given to 4n nurse.  All questions answered.  Patient left in stable condition.

## 2019-05-18 NOTE — Progress Notes (Signed)
Personally reviewed EKG--sinus, nonspecific TWI -no chest pain -reviewed troponins and discussed cased with cardiology-->likely demand ischemia, doubt ACS -continue ASA and plavix for now -hold xarelto in case pt needs I&D  DTat

## 2019-05-18 NOTE — Progress Notes (Signed)
Pt c/o CP- NSR BP stable. Dr. Humphrey Rolls made aware. New order for Morphine 2mg  x1. Will continue to monitor pt

## 2019-05-18 NOTE — Progress Notes (Signed)
[facility] Progress Note     Subjective: Says leg still hurting. WBC up.   Objective: Vital signs in last 24 hours: Temp:  [97.5 F (36.4 C)-100.4 F (38 C)] 97.5 F (36.4 C) (05/08 0732) Pulse Rate:  [78-93] 89 (05/08 1000) Resp:  [8-35] 20 (05/08 1000) BP: (105-125)/(67-91) 120/84 (05/08 0800) SpO2:  [90 %-97 %] 95 % (05/08 1000) Weight:  [103.5 kg-103.6 kg] 103.6 kg (05/08 0600) Last BM Date: 05/17/19  Intake/Output from previous day: 05/07 0701 - 05/08 0700 In: 2321.5 [P.O.:1200; I.V.:671.6; IV Piggyback:449.9] Out: 900 [Urine:900] Intake/Output this shift: No intake/output data recorded.  left groin leg with improving erythema, some flutulance and a small ulceration of the skin forming, no expressible drainage, tender  Lab Results:  Recent Labs    05/16/19 0410 05/18/19 0448  WBC 15.1* 19.1*  HGB 12.4* 13.3  HCT 39.3 41.2  PLT 255 285   BMET Recent Labs    05/17/19 0407 05/18/19 0448  NA 137 134*  K 3.8 3.5  CL 104 101  CO2 21* 23  GLUCOSE 153* 179*  BUN 20 23  CREATININE 1.49* 1.21  CALCIUM 8.0* 7.6*   Anti-infectives: Anti-infectives (From admission, onward)   Start     Dose/Rate Route Frequency Ordered Stop   05/18/19 1200  ceFEPIme (MAXIPIME) 2 g in sodium chloride 0.9 % 100 mL IVPB     2 g 200 mL/hr over 30 Minutes Intravenous Every 8 hours 05/18/19 1013     05/15/19 1500  vancomycin (VANCOREADY) IVPB 1250 mg/250 mL     1,250 mg 166.7 mL/hr over 90 Minutes Intravenous Every 24 hours 05/14/19 1447     05/15/19 0000  ceFEPIme (MAXIPIME) 2 g in sodium chloride 0.9 % 100 mL IVPB  Status:  Discontinued     2 g 200 mL/hr over 30 Minutes Intravenous Every 12 hours 05/14/19 1424 05/18/19 1013   05/14/19 1300  vancomycin (VANCOREADY) IVPB 2000 mg/400 mL     2,000 mg 200 mL/hr over 120 Minutes Intravenous  Once 05/14/19 1223 05/14/19 1506   05/14/19 1200  ceFEPIme (MAXIPIME) 2 g in sodium chloride 0.9 % 100 mL IVPB     2 g 200  mL/hr over 30 Minutes Intravenous  Once 05/14/19 1158 05/14/19 1258   05/14/19 1200  vancomycin (VANCOCIN) IVPB 1000 mg/200 mL premix  Status:  Discontinued     1,000 mg 200 mL/hr over 60 Minutes Intravenous  Once 05/14/19 1158 05/14/19 1223      Assessment/Plan: [name] is a 64 yo with cellulitis of the left groin and upper leg. Improving some but area is developing some fluctuance on exam. May be getting a abscess. Consider CT repeat in the next 24 hrs to assess for fluid collection Leukocytosis up slightly    LOS: 4 days    [name] 05/18/2019

## 2019-05-18 NOTE — Consult Note (Signed)
Reason for Consult: Necrotizing soft tissue infection left groin Referring Physician: Caedan Wilber is an 64 y.o. male.  HPI: 64 year old male with history of coronary artery disease with stent placement in the past and non-STEMI 1 month ago, CKD 3, DM, and HTN was admitted to [facility] with right groin cellulitis 05/14/2019.  He was treated with IV antibiotics.  He was seen in consultation by Dr. Constance Haw from general surgery.  This area was evaluated with CT scan on 5/4 and then on 5/5.  No incision and drainage was recommended at that time.  He underwent another CT scan today which showed development of gas in the soft tissues of the right groin with concern for necrotizing soft tissue infection.  He was transferred to [facility] by the hospitalist service.  Dr. Constance Haw called me as well to discuss the case.  He complains of significant pain in his left groin.  He says there are areas that have been draining.  Past Medical History:  Diagnosis Date  . Bulging lumbar disc   . CAD (coronary artery disease) [phone]   a. prior LAD stenting. b. s/p DES to [facility] 08/2015 @ [facility]. c. 04/2016 Cardiac cath at [facility]. # vessel CAD, with patent stent in the PLAD and RCA. Diffuse dLAD, OM2, and  RPDA disease. No aortic stenosis. Elevated LVEDP. No lesion to suggest ACS. Continue medical managment.  . Chronic lower back pain   . CKD (chronic kidney disease), stage II   . Cocaine abuse ([place])   . DM2 (diabetes mellitus, type 2) ([place])   . GERD (gastroesophageal reflux disease)   . Headache    "often; not regular" (11/30/2017)  . HTN (hypertension)   . Hyperlipidemia   . Ischemic cardiomyopathy   . MI, old   . Obesity   . Pneumonia    hx  . Sleep apnea    "have machine; I don't use it" (11/30/2017)  . Tobacco use     Past Surgical History:  Procedure Laterality Date  . APPENDECTOMY    . CARDIAC CATHETERIZATION N/A 09/07/2015   Procedure: Left Heart Cath and Coronary  Angiography;  Surgeon: Leonie Man, MD;  Location: [place] [facility];  Service: Cardiovascular;  Laterality: N/A;  . CARDIAC CATHETERIZATION N/A 09/07/2015   Procedure: Coronary Stent Intervention;  Surgeon: Leonie Man, MD;  Location: [place] [facility];  Service: Cardiovascular;  Laterality: N/A;  . CORONARY ANGIOGRAM  09/07/13   residual RCA and OM disease  . CORONARY ANGIOPLASTY WITH STENT PLACEMENT    . CORONARY STENT INTERVENTION N/A 10/16/2018   Procedure: CORONARY STENT INTERVENTION;  Surgeon: Burnell Blanks, MD;  Location: [place] [facility];  Service: Cardiovascular;  Laterality: N/A;  . CORONARY STENT INTERVENTION N/A 03/11/2019   Procedure: CORONARY STENT INTERVENTION;  Surgeon: [name], MD;  Location: [place] [facility];  Service: Cardiovascular;  Laterality: N/A;  . FRACTURE SURGERY    . INSERTION OF ILIAC STENT Left 11/30/2017   Left external illiac stent  . INSERTION OF ILIAC STENT  11/30/2017   Procedure: Insertion Of Iliac Stent;  Surgeon: Lorretta Harp, MD;  Location: [place] CV [facility];  Service: Cardiovascular;;  Left external illiac stent  . KNEE ARTHROSCOPY Left   . KNEE SURGERY     "ligaments, cartilage; tendon, put a pin in" (11/30/2017)  . LEFT HEART CATH Bilateral 07/08/2012   Procedure: LEFT HEART CATH;  Surgeon: [name], MD;  Location: [place] CATH LAB;  Service: Cardiovascular;  Laterality: Bilateral;  . LEFT HEART CATH AND CORONARY ANGIOGRAPHY N/A 10/16/2018   Procedure: LEFT HEART CATH AND CORONARY ANGIOGRAPHY;  Surgeon: Burnell Blanks, MD;  Location: [place] [facility];  Service: Cardiovascular;  Laterality: N/A;  . LEFT HEART CATH AND CORONARY ANGIOGRAPHY N/A 03/11/2019   Procedure: LEFT HEART CATH AND CORONARY ANGIOGRAPHY;  Surgeon: [name], MD;  Location: [place] [facility];  Service: Cardiovascular;  Laterality: N/A;  . LEFT HEART CATHETERIZATION WITH CORONARY ANGIOGRAM N/A 09/06/2013   STEMI, 2nd ISR  LAD. Procedure: LEFT HEART CATHETERIZATION WITH CORONARY ANGIOGRAM;  Surgeon: [name], MD;  Location: [facility] CATH LAB;  Service: Cardiovascular;  Laterality: N/A;  . LOWER EXTREMITY ANGIOGRAPHY N/A 11/30/2017   Procedure: LOWER EXTREMITY ANGIOGRAPHY;  Surgeon: Lorretta Harp, MD;  Location: [place] [facility];  Service: Cardiovascular;  Laterality: N/A;  . PERCUTANEOUS CORONARY STENT INTERVENTION (PCI-S)  07/08/2012   Procedure: PERCUTANEOUS CORONARY STENT INTERVENTION (PCI-S);  Surgeon: [name], MD;  Location: [facility] CATH LAB;  Service: Cardiovascular;;  DES LAD  . PERCUTANEOUS CORONARY STENT INTERVENTION (PCI-S) N/A 09/06/2013   Procedure: PERCUTANEOUS CORONARY STENT INTERVENTION (PCI-S);  Surgeon: [name], MD;  Location: [facility] CATH LAB;  Service: Cardiovascular;  Laterality: N/A;  Mid LAD 3.0/24mm Promus  . WRIST FRACTURE SURGERY Bilateral     Family History  Problem Relation Age of Onset  . Hypertension Mother   . Diabetes Mother     Social History:  reports that he has quit smoking. His smoking use included cigarettes. He has a 24.00 pack-year smoking history. He has never used smokeless tobacco. He reports current alcohol use. He reports that he does not use drugs.  Allergies: No Known Allergies  Medications: I have reviewed the patient's current medications.  Results for orders placed or performed during the hospital encounter of 05/14/19 (from the past 48 hour(s))  Basic metabolic panel     Status: Abnormal   Collection Time: 05/17/19  4:07 AM  Result Value Ref Range   Sodium 137 135 - 145 mmol/L   Potassium 3.8 3.5 - 5.1 mmol/L   Chloride 104 98 - 111 mmol/L   CO2 21 (L) 22 - 32 mmol/L   Glucose, Bld 153 (H) 70 - 99 mg/dL    Comment: Glucose reference range applies only to samples taken after fasting for at least 8 hours.   BUN 20 8 - 23 mg/dL   Creatinine, Ser 1.49 (H) 0.61 - 1.24 mg/dL   Calcium 8.0 (L) 8.9 - 10.3 mg/dL   GFR calc non Af Amer 49  (L) >60 mL/min   GFR calc Af Amer 57 (L) >60 mL/min   Anion gap 12 5 - 15    Comment: Performed at [facility], [address] [place] [address] [place], [place] [address]  Glucose, capillary     Status: Abnormal   Collection Time: 05/17/19  8:20 AM  Result Value Ref Range   Glucose-Capillary 139 (H) 70 - 99 mg/dL    Comment: Glucose reference range applies only to samples taken after fasting for at least 8 hours.   Comment 1 Notify RN    Comment 2 Document in Chart   Glucose, capillary     Status: Abnormal   Collection Time: 05/17/19 11:35 AM  Result Value Ref Range   Glucose-Capillary 167 (H) 70 - 99 mg/dL    Comment: Glucose reference range applies only to samples taken after fasting for at  least 8 hours.   Comment 1 Notify RN    Comment 2 Document in Chart   Glucose, capillary     Status: Abnormal   Collection Time: 05/17/19  3:53 PM  Result Value Ref Range   Glucose-Capillary 179 (H) 70 - 99 mg/dL    Comment: Glucose reference range applies only to samples taken after fasting for at least 8 hours.  Glucose, capillary     Status: Abnormal   Collection Time: 05/17/19  8:05 PM  Result Value Ref Range   Glucose-Capillary 211 (H) 70 - 99 mg/dL    Comment: Glucose reference range applies only to samples taken after fasting for at least 8 hours.  Basic metabolic panel     Status: Abnormal   Collection Time: 05/18/19  4:48 AM  Result Value Ref Range   Sodium 134 (L) 135 - 145 mmol/L   Potassium 3.5 3.5 - 5.1 mmol/L   Chloride 101 98 - 111 mmol/L   CO2 23 22 - 32 mmol/L   Glucose, Bld 179 (H) 70 - 99 mg/dL    Comment: Glucose reference range applies only to samples taken after fasting for at least 8 hours.   BUN 23 8 - 23 mg/dL   Creatinine, Ser 1.21 0.61 - 1.24 mg/dL   Calcium 7.6 (L) 8.9 - 10.3 mg/dL   GFR calc non Af Amer >60 >60 mL/min   GFR calc Af Amer >60 >60 mL/min   Anion gap 10 5 - 15    Comment: Performed at [facility], [address], [place], [place] [address]  CBC     Status:  Abnormal   Collection Time: 05/18/19  4:48 AM  Result Value Ref Range   WBC 19.1 (H) 4.0 - 10.5 K/uL   RBC 5.17 4.22 - 5.81 MIL/uL   Hemoglobin 13.3 13.0 - 17.0 g/dL   HCT 41.2 39.0 - 52.0 %   MCV 79.7 (L) 80.0 - 100.0 fL   MCH 25.7 (L) 26.0 - 34.0 pg   MCHC 32.3 30.0 - 36.0 g/dL   RDW 16.5 (H) 11.5 - 15.5 %   Platelets 285 150 - 400 K/uL   nRBC 0.3 (H) 0.0 - 0.2 %    Comment: Performed at [facility] [address] [place], [place] [address]  Magnesium     Status: None   Collection Time: 05/18/19  4:48 AM  Result Value Ref Range   Magnesium 2.1 1.7 - 2.4 mg/dL    Comment: Performed at [facility], [address], [place], [place] [address]  Troponin I (High Sensitivity)     Status: Abnormal   Collection Time: 05/18/19  4:48 AM  Result Value Ref Range   Troponin I (High Sensitivity) 278 (HH) <18 ng/L    Comment: CRITICAL RESULT CALLED TO, READ BACK BY AND VERIFIED WITH: L BULLINS AT 1330 ON 05/18/2019 BY MOSLEY,J  (NOTE) Elevated high sensitivity troponin I (hsTnI) values and significant  changes across serial measurements may suggest ACS but many other  chronic and acute conditions are known to elevate hsTnI results.  Refer to the Links section for chest pain algorithms and additional  guidance. Performed at [facility], [address], [place],  [address]   Glucose, capillary     Status: Abnormal   Collection Time: 05/18/19  7:31 AM  Result Value Ref Range   Glucose-Capillary 181 (H) 70 - 99 mg/dL    Comment: Glucose reference range applies only to samples taken after fasting for at least  8 hours.  Glucose, capillary     Status: Abnormal   Collection Time: 05/18/19 11:01 AM  Result Value Ref Range   Glucose-Capillary 252 (H) 70 - 99 mg/dL    Comment: Glucose reference range applies only to samples taken after fasting for at least 8 hours.  Troponin I (High Sensitivity)     Status: Abnormal   Collection Time: 05/18/19  2:38 PM  Result Value Ref Range   Troponin I  (High Sensitivity) 298 (HH) <18 ng/L    Comment: CRITICAL VALUE NOTED.  VALUE IS CONSISTENT WITH PREVIOUSLY REPORTED AND CALLED VALUE. (NOTE) Elevated high sensitivity troponin I (hsTnI) values and significant  changes across serial measurements may suggest ACS but many other  chronic and acute conditions are known to elevate hsTnI results.  Refer to the Links section for chest pain algorithms and additional  guidance. Performed at [facility], [address], [place], [place] [address]   Glucose, capillary     Status: None   Collection Time: 05/18/19  5:47 PM  Result Value Ref Range   Glucose-Capillary 92 70 - 99 mg/dL    Comment: Glucose reference range applies only to samples taken after fasting for at least 8 hours.  Glucose, capillary     Status: Abnormal   Collection Time: 05/18/19  8:34 PM  Result Value Ref Range   Glucose-Capillary 167 (H) 70 - 99 mg/dL    Comment: Glucose reference range applies only to samples taken after fasting for at least 8 hours.   Comment 1 Notify RN    Comment 2 Document in Chart     CT ABDOMEN PELVIS WO CONTRAST  Result Date: 05/18/2019 CLINICAL DATA:  Lower abdominal pain. Sepsis. Left groin cellulitis. EXAM: CT CHEST, ABDOMEN AND PELVIS WITHOUT CONTRAST TECHNIQUE: Multidetector CT imaging of the chest, abdomen and pelvis was performed following the standard protocol without IV contrast. COMPARISON:  Pelvis CT on 05/15/2019, and AP CT on 05/14/2019 FINDINGS: CT CHEST FINDINGS Cardiovascular: No acute findings. Aortic and coronary artery atherosclerosis noted. Mediastinum/Lymph Nodes: No masses or pathologically enlarged lymph nodes identified on this unenhanced exam. Lungs/Pleura: Mild scarring is seen predominately involving the right upper lobe. No evidence of pulmonary infiltrate or pleural effusion. A 5 mm pulmonary nodule is seen in the right lung apex on image 23/3. Musculoskeletal: No suspicious bone lesions identified. CT ABDOMEN AND PELVIS  FINDINGS Hepatobiliary: No masses visualized on this unenhanced exam. Gallbladder is unremarkable. No evidence of biliary ductal dilatation. Pancreas: No mass or inflammatory changes identified on this unenhanced exam. Spleen: Within normal limits in size. Adrenals/Urinary Tract: No evidence of urolithiasis or hydronephrosis. Unremarkable appearance of bladder. Stomach/Bowel: No evidence of obstruction, inflammatory process, or abnormal fluid collections. Vascular/Lymphatic: No pathologically enlarged lymph nodes identified. No abdominal aortic aneurysm. Aortic atherosclerosis noted. Reproductive:  No masses or other significant abnormality. Other:  None. Musculoskeletal: No suspicious bone lesions identified. Increased edema and new subcutaneous emphysema is seen throughout the subcutaneous tissues of the left hip and proximal thigh compared to recent exams. This is highly suspicious for necrotizing fasciitis. IMPRESSION: 1. Increased edema and new emphysema throughout the subcutaneous tissues of left hip and proximal thigh, highly suspicious for necrotizing fasciitis. 2. 5 mm indeterminate right upper lobe pulmonary nodule. No follow-up needed if patient is low-risk. Non-contrast chest CT can be considered in 12 months if patient is high-risk. This recommendation follows the consensus statement: Guidelines for Management of Incidental Pulmonary Nodules Detected on CT Images: From the Fleischner Society 2017;  Radiology 2017; [id number]. Critical Value/emergent results were called by telephone at the time of interpretation on 05/18/2019 at 6:39 pm to provider DAVID TAT , who verbally acknowledged these results. Aortic Atherosclerosis (ICD10-I70.0). Electronically Signed   By: Marlaine Hind M.D.   On: 05/18/2019 18:52   CT CHEST WO CONTRAST  Result Date: 05/18/2019 CLINICAL DATA:  Lower abdominal pain. Sepsis. Left groin cellulitis. EXAM: CT CHEST, ABDOMEN AND PELVIS WITHOUT CONTRAST TECHNIQUE: Multidetector CT  imaging of the chest, abdomen and pelvis was performed following the standard protocol without IV contrast. COMPARISON:  Pelvis CT on 05/15/2019, and AP CT on 05/14/2019 FINDINGS: CT CHEST FINDINGS Cardiovascular: No acute findings. Aortic and coronary artery atherosclerosis noted. Mediastinum/Lymph Nodes: No masses or pathologically enlarged lymph nodes identified on this unenhanced exam. Lungs/Pleura: Mild scarring is seen predominately involving the right upper lobe. No evidence of pulmonary infiltrate or pleural effusion. A 5 mm pulmonary nodule is seen in the right lung apex on image 23/3. Musculoskeletal: No suspicious bone lesions identified. CT ABDOMEN AND PELVIS FINDINGS Hepatobiliary: No masses visualized on this unenhanced exam. Gallbladder is unremarkable. No evidence of biliary ductal dilatation. Pancreas: No mass or inflammatory changes identified on this unenhanced exam. Spleen: Within normal limits in size. Adrenals/Urinary Tract: No evidence of urolithiasis or hydronephrosis. Unremarkable appearance of bladder. Stomach/Bowel: No evidence of obstruction, inflammatory process, or abnormal fluid collections. Vascular/Lymphatic: No pathologically enlarged lymph nodes identified. No abdominal aortic aneurysm. Aortic atherosclerosis noted. Reproductive:  No masses or other significant abnormality. Other:  None. Musculoskeletal: No suspicious bone lesions identified. Increased edema and new subcutaneous emphysema is seen throughout the subcutaneous tissues of the left hip and proximal thigh compared to recent exams. This is highly suspicious for necrotizing fasciitis. IMPRESSION: 1. Increased edema and new emphysema throughout the subcutaneous tissues of left hip and proximal thigh, highly suspicious for necrotizing fasciitis. 2. 5 mm indeterminate right upper lobe pulmonary nodule. No follow-up needed if patient is low-risk. Non-contrast chest CT can be considered in 12 months if patient is high-risk.  This recommendation follows the consensus statement: Guidelines for Management of Incidental Pulmonary Nodules Detected on CT Images: From the Fleischner Society 2017; Radiology 2017; 284:228-243. Critical Value/emergent results were called by telephone at the time of interpretation on 05/18/2019 at 6:39 pm to provider DAVID TAT , who verbally acknowledged these results. Aortic Atherosclerosis (ICD10-I70.0). Electronically Signed   By: Marlaine Hind M.D.   On: 05/18/2019 18:52    Review of Systems  Constitutional: Positive for fatigue and fever.  HENT: Negative.   Eyes: Negative.   Respiratory: Negative.   Cardiovascular:       Recent MI  Gastrointestinal: Negative.   Endocrine: Negative.   Genitourinary: Negative.   Musculoskeletal:       See HPI  Allergic/Immunologic: Negative.   Neurological: Negative.   Hematological: Negative.    Blood pressure 106/74, pulse 87, temperature 98.3 F (36.8 C), temperature source Oral, resp. rate (!) 22, height 5\' 9"  (1.753 m), weight 103.6 kg, SpO2 95 %. Physical Exam  Constitutional: He is oriented to person, place, and time. He appears well-developed and well-nourished.  HENT:  Head: Normocephalic.  Right Ear: External ear normal.  Left Ear: External ear normal.  Mouth/Throat: Oropharynx is clear and moist.  Eyes: Pupils are equal, round, and reactive to light. Right eye exhibits no discharge. Left eye exhibits no discharge. No scleral icterus.  Neck: No thyromegaly present.  Cardiovascular: Normal rate, regular rhythm and normal heart sounds.  No murmur heard.  Respiratory: Effort normal and breath sounds normal. No respiratory distress. He has no wheezes. He has no rales.  GI: Soft. He exhibits no distension. There is no abdominal tenderness. There is no rebound and no guarding.  No hepatosplenomegaly   Musculoskeletal:     Cervical back: Neck supple.     Comments: Groin area with large area of skin necrosis and fluctuance and cellulitis  extending medially and laterally along his thigh, see picture  Neurological: He is alert and oriented to person, place, and time. He exhibits normal muscle tone.  Skin: Skin is warm.  Psychiatric: He has a normal mood and affect.  A&O x3      Assessment/Plan: Necrotizing soft tissue infection left groin -continue Vanco and Maxipime, will take to the operating room for emergent debridement.  I discussed the procedure, risks, and benefits with him.  I also spoke with his daughter on the phone.  I advised him this may need several operations.  I answered his questions and he is agreeable.  CAD -cardiology input appreciated and I discussed with the anesthesia team.  Zenovia Jarred 05/18/2019, 11:43 PM

## 2019-05-18 NOTE — Progress Notes (Signed)
Called by radiologist, Dr. Kris Hartmann about repeat CT abd/pelvis today -concerned about early necrotizing fasciitis due to new subQ emyphysema and edema extending into left hip and prox thigh.  Patient had been improving last 2-3 days with defervescence and resolution of sepsis physiology and decrease WBC and improved renal function.  Today, noted increase WBC and exam showed new fluctuance in left groin, so repeat CT abd/pelvis ordered.  Patient has been on vanc/cefepime for entire admission.  I have added flagyl.  I discussed case with general surgery, Dr. Curlene Labrum.  She stated that given his high surgical risk due to recent MI on 03/10/19 and DES to RCA and EF 35% that patient would be best served transferring to [facility]. Dr. Constance Haw spoke with [facility] to discuss case and Dr. Georganna Skeans is aware of the patient transfer.  I had discussed with cardiology (Dr. Cristopher Peru) regarding the patient's troponins and ECG today.  He felt this is due to demand ischemia and no further invasive intervention is required at this time.  I have informed the patient and his daughter after the CT findings and impending [facility] and possible surgical debridement.  They expressed understanding and agree with the plan.  DTat

## 2019-05-19 ENCOUNTER — Inpatient Hospital Stay (HOSPITAL_COMMUNITY): Payer: No Typology Code available for payment source | Admitting: Certified Registered"

## 2019-05-19 ENCOUNTER — Encounter (HOSPITAL_COMMUNITY)
Admission: EM | Disposition: A | Discharge status: Skilled Nursing Facility | Payer: Self-pay | Source: Home / Self Care | Attending: Internal Medicine

## 2019-05-19 DIAGNOSIS — T40601A Poisoning by unspecified narcotics, accidental (unintentional), initial encounter: Secondary | ICD-10-CM

## 2019-05-19 DIAGNOSIS — L03311 Cellulitis of abdominal wall: Secondary | ICD-10-CM

## 2019-05-19 HISTORY — PX: INCISION AND DRAINAGE OF WOUND: SHX1803

## 2019-05-19 LAB — GLUCOSE, CAPILLARY
Glucose-Capillary: 120 mg/dL — ABNORMAL HIGH (ref 70–99)
Glucose-Capillary: 136 mg/dL — ABNORMAL HIGH (ref 70–99)
Glucose-Capillary: 274 mg/dL — ABNORMAL HIGH (ref 70–99)
Glucose-Capillary: 218 mg/dL — ABNORMAL HIGH (ref 70–99)
Glucose-Capillary: 238 mg/dL — ABNORMAL HIGH (ref 70–99)

## 2019-05-19 LAB — COMPREHENSIVE METABOLIC PANEL
ALT: 17 U/L (ref 0–44)
AST: 21 U/L (ref 15–41)
Albumin: 1.5 g/dL — ABNORMAL LOW (ref 3.5–5.0)
Alkaline Phosphatase: 59 U/L (ref 38–126)
Anion gap: 13 (ref 5–15)
BUN: 29 mg/dL — ABNORMAL HIGH (ref 8–23)
CO2: 20 mmol/L — ABNORMAL LOW (ref 22–32)
Calcium: 7.3 mg/dL — ABNORMAL LOW (ref 8.9–10.3)
Chloride: 101 mmol/L (ref 98–111)
Creatinine, Ser: 1.33 mg/dL — ABNORMAL HIGH (ref 0.61–1.24)
GFR calc Af Amer: >60 mL/min (ref >60)
GFR calc non Af Amer: 56 mL/min — ABNORMAL LOW (ref >60)
Glucose, Bld: 143 mg/dL — ABNORMAL HIGH (ref 70–99)
Potassium: 4 mmol/L (ref 3.5–5.1)
Sodium: 134 mmol/L — ABNORMAL LOW (ref 135–145)
Total Bilirubin: 0.7 mg/dL (ref 0.3–1.2)
Total Protein: 5.5 g/dL — ABNORMAL LOW (ref 6.5–8.1)

## 2019-05-19 LAB — CULTURE, BLOOD (ROUTINE X 2)
Culture: NO GROWTH
Culture: NO GROWTH
Special Requests: ADEQUATE

## 2019-05-19 LAB — CBC
HCT: 36.5 % — ABNORMAL LOW (ref 39.0–52.0)
Hemoglobin: 11.7 g/dL — ABNORMAL LOW (ref 13.0–17.0)
MCH: 25.8 pg — ABNORMAL LOW (ref 26.0–34.0)
MCHC: 32.1 g/dL (ref 30.0–36.0)
MCV: 80.6 fL (ref 80.0–100.0)
Platelets: 273 10*3/uL (ref 150–400)
RBC: 4.53 MIL/uL (ref 4.22–5.81)
RDW: 16.8 % — ABNORMAL HIGH (ref 11.5–15.5)
WBC: 23.6 10*3/uL — ABNORMAL HIGH (ref 4.0–10.5)
nRBC: 0.4 % — ABNORMAL HIGH (ref 0.0–0.2)

## 2019-05-19 LAB — VANCOMYCIN, TROUGH: Vancomycin Tr: 10 ug/mL — ABNORMAL LOW (ref 15–20)

## 2019-05-19 SURGERY — IRRIGATION AND DEBRIDEMENT WOUND
Anesthesia: General | Site: Groin | Laterality: Left

## 2019-05-19 MED ORDER — GLYCOPYRROLATE 0.2 MG/ML IJ SOLN
INTRAMUSCULAR | Status: DC | PRN
Start: 2019-05-19 — End: 2019-05-19
  Administered 2019-05-19: .2 mg via INTRAVENOUS

## 2019-05-19 MED ORDER — OXYCODONE HCL 5 MG PO TABS
5.0000 mg | ORAL_TABLET | Freq: Four times a day (QID) | ORAL | Status: DC | PRN
  Administered 2019-05-19 (×2): 5 mg via ORAL
  Filled 2019-05-19 (×2): qty 1

## 2019-05-19 MED ORDER — ALBUMIN HUMAN 5 % IV SOLN
INTRAVENOUS | Status: DC | PRN
Start: 2019-05-19 — End: 2019-05-19

## 2019-05-19 MED ORDER — PHENYLEPHRINE HCL (PRESSORS) 10 MG/ML IV SOLN
INTRAVENOUS | Status: DC | PRN
Start: 2019-05-19 — End: 2019-05-19
  Administered 2019-05-19: 80 ug via INTRAVENOUS
  Administered 2019-05-19: 120 ug via INTRAVENOUS
  Administered 2019-05-19: 80 ug via INTRAVENOUS
  Administered 2019-05-19: 120 ug via INTRAVENOUS

## 2019-05-19 MED ORDER — MORPHINE SULFATE (PF) 2 MG/ML IV SOLN
2.0000 mg | INTRAVENOUS | Status: DC | PRN
  Administered 2019-05-19 – 2019-05-20 (×2): 2 mg via INTRAVENOUS
  Administered 2019-05-20: 4 mg via INTRAVENOUS
  Administered 2019-05-20 – 2019-05-21 (×3): 2 mg via INTRAVENOUS
  Administered 2019-05-21 – 2019-05-22 (×2): 4 mg via INTRAVENOUS
  Administered 2019-05-22: 2 mg via INTRAVENOUS
  Administered 2019-05-22 (×2): 4 mg via INTRAVENOUS
  Administered 2019-05-23 (×3): 2 mg via INTRAVENOUS
  Filled 2019-05-19 (×4): qty 1
  Filled 2019-05-19 (×3): qty 2
  Filled 2019-05-19 (×2): qty 1
  Filled 2019-05-19 (×2): qty 2
  Filled 2019-05-19 (×3): qty 1

## 2019-05-19 MED ORDER — VANCOMYCIN HCL IN DEXTROSE 1-5 GM/200ML-% IV SOLN: 1000.0000 mg | Freq: Two times a day (BID) | INTRAVENOUS | Status: DC

## 2019-05-19 MED ORDER — VASOPRESSIN 20 UNIT/ML IV SOLN
INTRAVENOUS | Status: DC | PRN
Start: 2019-05-19 — End: 2019-05-19
  Administered 2019-05-19: 2 [IU] via INTRAVENOUS

## 2019-05-19 MED ORDER — SUCCINYLCHOLINE CHLORIDE 200 MG/10ML IV SOSY
PREFILLED_SYRINGE | INTRAVENOUS | Status: DC | PRN
  Administered 2019-05-19: 120 mg via INTRAVENOUS

## 2019-05-19 MED ORDER — ONDANSETRON HCL 4 MG/2ML IJ SOLN
INTRAMUSCULAR | Status: DC | PRN
  Administered 2019-05-19: 4 mg via INTRAVENOUS

## 2019-05-19 MED ORDER — VASOPRESSIN 20 UNIT/ML IV SOLN
INTRAVENOUS | Status: AC
  Filled 2019-05-19: qty 1

## 2019-05-19 MED ORDER — SUFENTANIL CITRATE 50 MCG/ML IV SOLN
INTRAVENOUS | Status: DC | PRN
  Administered 2019-05-19: 10 ug via INTRAVENOUS

## 2019-05-19 MED ORDER — GLUCERNA SHAKE PO LIQD
237.0000 mL | Freq: Three times a day (TID) | ORAL | Status: DC
  Administered 2019-05-19 – 2019-05-22 (×4): 237 mL via ORAL

## 2019-05-19 MED ORDER — PROPOFOL 10 MG/ML IV BOLUS
INTRAVENOUS | Status: DC | PRN
  Administered 2019-05-19: 100 mg via INTRAVENOUS
  Administered 2019-05-19: 50 mg via INTRAVENOUS

## 2019-05-19 MED ORDER — PHENYLEPHRINE HCL-NACL 10-0.9 MG/250ML-% IV SOLN
INTRAVENOUS | Status: DC | PRN
Start: 2019-05-19 — End: 2019-05-19
  Administered 2019-05-19: 25 ug/min via INTRAVENOUS

## 2019-05-19 MED ORDER — ONDANSETRON HCL 4 MG/2ML IJ SOLN: 4.0000 mg | Freq: Once | INTRAMUSCULAR | Status: DC | PRN

## 2019-05-19 MED ORDER — LACTATED RINGERS IV SOLN
INTRAVENOUS | Status: DC | PRN
Start: 2019-05-19 — End: 2019-05-19

## 2019-05-19 MED ORDER — FENTANYL CITRATE (PF) 100 MCG/2ML IJ SOLN: 25.0000 ug | INTRAMUSCULAR | Status: DC | PRN

## 2019-05-19 MED ORDER — LIDOCAINE 2% (20 MG/ML) 5 ML SYRINGE
INTRAMUSCULAR | Status: DC | PRN
  Administered 2019-05-19: 100 mg via INTRAVENOUS

## 2019-05-19 SURGICAL SUPPLY — 42 items
ADH SKN CLS APL DERMABOND .7 (GAUZE/BANDAGES/DRESSINGS) ×1
APL PRP STRL LF DISP 70% ISPRP (MISCELLANEOUS) ×1
BLADE CLIPPER SURG (BLADE) ×2 IMPLANT
BNDG GAUZE ELAST 4 BULKY (GAUZE/BANDAGES/DRESSINGS) ×2 IMPLANT
CANISTER SUCT 3000ML PPV (MISCELLANEOUS) IMPLANT
CHLORAPREP W/TINT 26 (MISCELLANEOUS) ×3 IMPLANT
COVER SURGICAL LIGHT HANDLE (MISCELLANEOUS) ×3 IMPLANT
COVER WAND RF STERILE (DRAPES) ×3 IMPLANT
DECANTER SPIKE VIAL GLASS SM (MISCELLANEOUS) ×3 IMPLANT
DERMABOND ADVANCED (GAUZE/BANDAGES/DRESSINGS) ×2
DERMABOND ADVANCED .7 DNX12 (GAUZE/BANDAGES/DRESSINGS) ×1 IMPLANT
DRAIN PENROSE 1/2X12 LTX STRL (WOUND CARE) IMPLANT
DRAPE LAPAROSCOPIC ABDOMINAL (DRAPES) ×3 IMPLANT
DRAPE LAPAROTOMY 100X72 PEDS (DRAPES) ×3 IMPLANT
ELECT REM PT RETURN 9FT ADLT (ELECTROSURGICAL) ×3
ELECTRODE REM PT RTRN 9FT ADLT (ELECTROSURGICAL) ×1 IMPLANT
GLOVE BIO SURGEON STRL SZ8 (GLOVE) ×7 IMPLANT
GLOVE BIOGEL PI IND STRL 8 (GLOVE) ×1 IMPLANT
GLOVE BIOGEL PI INDICATOR 8 (GLOVE) ×2
GOWN STRL REUS W/ TWL LRG LVL3 (GOWN DISPOSABLE) ×1 IMPLANT
GOWN STRL REUS W/ TWL XL LVL3 (GOWN DISPOSABLE) ×1 IMPLANT
GOWN STRL REUS W/TWL LRG LVL3 (GOWN DISPOSABLE) ×3
GOWN STRL REUS W/TWL XL LVL3 (GOWN DISPOSABLE) ×3
KIT BASIN OR (CUSTOM PROCEDURE TRAY) ×3 IMPLANT
KIT TURNOVER KIT B (KITS) ×3 IMPLANT
NS IRRIG 1000ML POUR BTL (IV SOLUTION) ×3 IMPLANT
PACK GENERAL/GYN (CUSTOM PROCEDURE TRAY) ×3 IMPLANT
PAD ABD 8X10 STRL (GAUZE/BANDAGES/DRESSINGS) ×6 IMPLANT
PAD ARMBOARD 7.5X6 YLW CONV (MISCELLANEOUS) ×6 IMPLANT
PENCIL SMOKE EVACUATOR (MISCELLANEOUS) ×3 IMPLANT
SPECIMEN JAR SMALL (MISCELLANEOUS) IMPLANT
SPONGE LAP 18X18 RF (DISPOSABLE) ×7 IMPLANT
SUT MNCRL AB 4-0 PS2 18 (SUTURE) ×3 IMPLANT
SUT PROLENE 2 0 CT2 30 (SUTURE) ×9 IMPLANT
SUT VIC AB 2-0 SH 27 (SUTURE) ×3
SUT VIC AB 2-0 SH 27X BRD (SUTURE) ×1 IMPLANT
SUT VIC AB 3-0 SH 27 (SUTURE) ×3
SUT VIC AB 3-0 SH 27X BRD (SUTURE) ×1 IMPLANT
SUT VICRYL AB 3 0 TIES (SUTURE) IMPLANT
TAPE CLOTH SURG 6X10 WHT LF (GAUZE/BANDAGES/DRESSINGS) ×3 IMPLANT
TOWEL GREEN STERILE (TOWEL DISPOSABLE) ×3 IMPLANT
TOWEL GREEN STERILE FF (TOWEL DISPOSABLE) ×3 IMPLANT

## 2019-05-19 NOTE — Progress Notes (Signed)
PROGRESS NOTE    [name]  [id number] DOB: [birth year] DOA: 05/14/2019 PCP: System, Pcp Not In   Brief Narrative: HPI by Dr. Denton Brick on 05/14/2019 [name] is a 64 y.o. male with medical history significant for CAD, DM, systolic CHF, CKD 3, hypertension, diabetes, cocaine use, OSA, peripheral vascular disease, apical mural thrombus. Patient was brought to the ED via EMS with reports of overdose on tramadol-patient reported he had taken 3 doses of tramadol he was found in the bathtub, apparently he was soaking his whole left groin/perineal area.  At that time he was awake and alert.  Patient's blood pressure was soft/low, subsequently EMS sat patient up to get his vitals, there was a report of seizure activity, patient was unresponsive and shaky/tremulous.  He was given 5 mg of Versed, 500 mill bolus of fluids.   On my evaluation, patient awakens to touch and voice, but drifts back to sleep.  He tells me he took just 2 doses of Ultram.  Reports pain and swelling in his left groin.  He denies difficulty breathing or cough, no abdominal pain, no pain with urination.   Recent hospitalization 2/28 - 03/12/19 under cardiology service for NSTEMI, troponin peaking at 575, requiring PCI of RCA/PDA, via cardiac cath 03/11/19.  On dual antiplatelets Plavix and aspirin.  Echocardiogram also showed apical filling defect consistent with thrombus/early thrombus formation.  Plan was to continue aspirin and Plavix for 30 days, to discontinue aspirin then and add Xarelto and continue Plavix long-term. Echo showed EF of 30 to 35%, due to history of intermittent compliance, was felt not to be a candidate for ICD.  Unless he could demonstrate compliance with medications and follow-up in the future.  ED Course: On arrival to the ED, patient was unresponsive, he was given 0.4 of Narcan mental status significantly improved.  Blood pressure dropped to 86/65 in the ED, improved after 3.5 L sepsis fluid bolus was  given in ED.  Febrile to 102.6.  Initial tachycardia 104.  Lactic acid was significantly elevated at 6 >> 4.3.  Creatinine elevated at 2.95.  Respiratory panel negative for COVID-19 and influenza.   Portable chest x-ray mild bilateral interstitial prominence, tiny bilateral pleural effusions and mild CHF cannot be excluded. Abdominal/pelvic CT without contrast-extensive inflammatory changes in the left inguinal region extending into the anterior medial upper thigh compatible with acute cellulitis.  No associated abscess is present.  Also low-density left perirectal fluid collection measuring up to 25 mm. UA Pending, blood and urine cultures ordered.  IV Vanco and cefepime started.  EDP talked to Dr. Melvyn Novas, who felt patient was appropriate for hospitalist admission, can consult if additional support needed. Hospitalist to admit for severe sepsis.  05/19/2019 patient was transferred to [facility] yesterday with CT of the abdomen and pelvis showed necrotizing fasciitis with worsening leukocytosis.  Patient was seen by general surgery and had debridement of the left groin.  Assessment & Plan:   Active Problems:   Tobacco use disorder   Cardiomyopathy, ischemic-EF 40-45% by echo 09/07/13   Essential hypertension   Severe sepsis ([place])   Acute renal failure superimposed on stage 3b chronic kidney disease (HCC)   Cocaine abuse ([place])   Cellulitis of left groin   Uncontrolled type 2 diabetes mellitus with hyperglycemia (HCC)   Elevated lactic acid level    #1 sepsis secondary to left groin necrotizing fasciitis/cellulitis-status post debridement on 05/18/2019. Continue vancomycin Flagyl and cefepime.  Pharmacy following.  Follow-up cultures Wbc 23.6 from 19.1  Sure abundant gram-negative rods and few gram-positive cocci and few gram-positive rods  #2 AKI on CKD stage IIIb secondary to hypotension and sepsis-Baseline creatinine 1.4-1.6.  On admission his creatinine was 2.95 down  to 1.33   #3 acute hypoxic respiratory failure resolved likely secondary to COPD sepsis and hypoventilation  #4 type 2 diabetes with hyperglycemia uncontrolled A1c was 11.1 on February 2021 On Lantus 10 units with SSI CBG (last 3)  Recent Labs    05/18/19 2034 05/19/19 0117 05/19/19 0713  GLUCAP 167* 120* 136*   Prior to admission he was on Jardiance 12.5 mg daily with Lantus 30 units at bedtime Glucophage 1000 mg twice a day  #5 chronic diastolic and systolic heart failure -euvolemic echo ejection fraction 30 to 35% with grade 1 diastolic dysfunction on March 2021 On aldactone pta ?  He is also on Xarelto at home which I am not sure why he is on.  #6 CAD status post recent NSTEMI on aspirin Plavix  #7 cocaine and tobacco abuse-cessation discussed  #8 history of essential hypertension on Cozaar, metoprolol succinate, spironolactone prior to admission.  Blood pressure 110/65 continue to hold antihypertensives.  #9 hyperlipidemia on pravastatin at home  #10 diabetic neuropathy on Neurontin at home   Estimated body mass index is 33.73 kg/m as calculated from the following:   Height as of this encounter: 5\' 9"  (1.753 m).   Weight as of this encounter: 103.6 kg.  DVT prophylaxis: Heparin Code Status: Full code Family Communication: Discussed with patient Disposition Plan:Status is: Inpatient status post debridement of necrotizing fasciitis on IV antibiotics  Dispo: The patient is from: home              Anticipated d/c is to: snf              Anticipated d/c date [id number]              Patient currently is not medically stable to d/c.  Consultants: surgery   Procedures: Left groin debridement 05/18/2019 Antimicrobials- Anti-infectives (From admission, onward)   Start     Dose/Rate Route Frequency Ordered Stop   05/18/19 2000  metroNIDAZOLE (FLAGYL) IVPB 500 mg     500 mg 100 mL/hr over 60 Minutes Intravenous Every 8 hours 05/18/19 1913     05/18/19 1200  ceFEPIme  (MAXIPIME) 2 g in sodium chloride 0.9 % 100 mL IVPB     2 g 200 mL/hr over 30 Minutes Intravenous Every 8 hours 05/18/19 1013     05/15/19 1500  vancomycin (VANCOREADY) IVPB 1250 mg/250 mL     1,250 mg 166.7 mL/hr over 90 Minutes Intravenous Every 24 hours 05/14/19 1447     05/15/19 0000  ceFEPIme (MAXIPIME) 2 g in sodium chloride 0.9 % 100 mL IVPB  Status:  Discontinued     2 g 200 mL/hr over 30 Minutes Intravenous Every 12 hours 05/14/19 1424 05/18/19 1013   05/14/19 1300  vancomycin (VANCOREADY) IVPB 2000 mg/400 mL     2,000 mg 200 mL/hr over 120 Minutes Intravenous  Once 05/14/19 1223 05/14/19 1506   05/14/19 1200  ceFEPIme (MAXIPIME) 2 g in sodium chloride 0.9 % 100 mL IVPB     2 g 200 mL/hr over 30 Minutes Intravenous  Once 05/14/19 1158 05/14/19 1258   05/14/19 1200  vancomycin (VANCOCIN) IVPB 1000 mg/200 mL premix  Status:  Discontinued     1,000 mg 200 mL/hr over 60 Minutes Intravenous  Once 05/14/19 1158 05/14/19 1223       Subjective:  Patient is resting in bed awake alert feels better than yesterday denies any chest pain or shortness of breath  Objective: Vitals:   05/19/19 0355 05/19/19 0452 05/19/19 0500 05/19/19 0709  BP: 95/68 106/71  110/75  Pulse: 84 92  76  Resp: 20 (!) 23  20  Temp:   98.1 F (36.7 C) 98.4 F (36.9 C)  TempSrc: Oral  Oral   SpO2: 100% 95%  (!) 89%  Weight:      Height:        Intake/Output Summary (Last 24 hours) at 05/19/2019 0927 Last data filed at 05/19/2019 0700 Gross per 24 hour  Intake 1795.77 ml  Output 1005 ml  Net 790.77 ml   Filed Weights   05/17/19 0412 05/18/19 0419 05/18/19 0600  Weight: 102.6 kg 103.5 kg 103.6 kg    Examination:  General exam: Appears calm and comfortable  Respiratory system: Clear to auscultation. Respiratory effort normal. Cardiovascular system: S1 & S2 heard, RRR. No JVD, murmurs, rubs, gallops or clicks. No pedal edema. Gastrointestinal system: Abdomen is nondistended, soft and nontender. No  organomegaly or masses felt. Normal bowel sounds heard. Central nervous system: Alert and oriented. No focal neurological deficits. Extremities: The left groin is covered with dressing  skin: No rashes, lesions or ulcers Psychiatry: Judgement and insight appear normal. Mood & affect appropriate.     Data Reviewed: I have personally reviewed following labs and imaging studies  CBC: Recent Labs  Lab 05/14/19 1204 05/15/19 0505 05/16/19 0410 05/18/19 0448 05/19/19 0629  WBC 21.7* 17.1* 15.1* 19.1* 23.6*  NEUTROABS 18.5*  --   --   --   --   HGB 13.5 12.9* 12.4* 13.3 11.7*  HCT 44.2 41.7 39.3 41.2 36.5*  MCV 85.7 84.4 82.9 79.7* 80.6  PLT 290 253 255 285 [id number]   Basic Metabolic Panel: Recent Labs  Lab 05/15/19 0505 05/16/19 0410 05/17/19 0407 05/18/19 0448 05/19/19 0629  NA 143 136 137 134* 134*  K 3.9 4.0 3.8 3.5 4.0  CL 115* 106 104 101 101  CO2 19* 20* 21* 23 20*  GLUCOSE 117* 252* 153* 179* 143*  BUN 31* 24* 20 23 29*  CREATININE 2.18* 1.74* 1.49* 1.21 1.33*  CALCIUM 7.7* 7.7* 8.0* 7.6* 7.3*  MG  --   --   --  2.1  --    GFR: Estimated Creatinine Clearance: 66.6 mL/min (A) (by C-G formula based on SCr of 1.33 mg/dL (H)). Liver Function Tests: Recent Labs  Lab 05/14/19 1204 05/16/19 0410 05/19/19 0629  AST 24 27 21   ALT 19 21 17   ALKPHOS 77 72 59  BILITOT 0.9 1.0 0.7  PROT 7.6 6.5 5.5*  ALBUMIN 3.0* 2.2* 1.5*   No results for input(s): LIPASE, AMYLASE in the last 168 hours. No results for input(s): AMMONIA in the last 168 hours. Coagulation Profile: Recent Labs  Lab 05/14/19 1242  INR 2.0*   Cardiac Enzymes: No results for input(s): CKTOTAL, CKMB, CKMBINDEX, TROPONINI in the last 168 hours. BNP (last 3 results) No results for input(s): PROBNP in the last 8760 hours. HbA1C: No results for input(s): HGBA1C in the last 72 hours. CBG: Recent Labs  Lab 05/18/19 1101 05/18/19 1747 05/18/19 2034 05/19/19 0117 05/19/19 0713  GLUCAP 252* 92 167*  120* 136*   Lipid Profile: No results for input(s): CHOL, HDL, LDLCALC, TRIG, CHOLHDL, LDLDIRECT in the last 72 hours. Thyroid Function Tests: No results  for input(s): TSH, T4TOTAL, FREET4, T3FREE, THYROIDAB in the last 72 hours. Anemia Panel: No results for input(s): VITAMINB12, FOLATE, FERRITIN, TIBC, IRON, RETICCTPCT in the last 72 hours. Sepsis Labs: Recent Labs  Lab 05/14/19 1204 05/14/19 1416 05/14/19 1753  LATICACIDVEN 6.0* 4.3* 1.8    Recent Results (from the past 240 hour(s))  Urine culture     Status: None   Collection Time: 05/14/19 11:57 AM   Specimen: In/Out Cath Urine  Result Value Ref Range Status   Specimen Description   Final    IN/OUT CATH URINE Performed at [facility], [address] [place], [place] [address]    Special Requests   Final    NONE Performed at [facility], [address], [place], [place] [address]    Culture   Final    NO GROWTH Performed at [facility], [place] [address] [place], [place] [address]    Report Status 05/15/2019 FINAL  Final  Respiratory Panel by RT PCR (Flu A&B, Covid) - Nasopharyngeal Swab     Status: None   Collection Time: 05/14/19 12:00 PM   Specimen: Nasopharyngeal Swab  Result Value Ref Range Status   SARS Coronavirus 2 by RT PCR NEGATIVE NEGATIVE Final    Comment: (NOTE) SARS-CoV-2 target nucleic acids are NOT DETECTED. The SARS-CoV-2 RNA is generally detectable in upper respiratoy specimens during the acute phase of infection. The lowest concentration of SARS-CoV-2 viral copies this assay can detect is 131 copies/mL. A negative result does not preclude SARS-Cov-2 infection and should not be used as the sole basis for treatment or other patient management decisions. A negative result may occur with  improper specimen collection/handling, submission of specimen other than nasopharyngeal swab, presence of viral mutation(s) within the areas targeted by this assay, and inadequate number of viral  copies (<131 copies/mL). A negative result must be combined with clinical observations, patient history, and epidemiological information. The expected result is Negative. Fact Sheet for Patients:  PinkCheek.be Fact Sheet for Healthcare Providers:  GravelBags.it This test is not yet ap proved or cleared by the [place] FDA and  has been authorized for detection and/or diagnosis of SARS-CoV-2 by FDA under an Emergency Use Authorization (EUA). This EUA will remain  in effect (meaning this test can be used) for the duration of the COVID-19 declaration under Section 564(b)(1) of the Act, 21 U.S.C. section 360bbb-3(b)(1), unless the authorization is terminated or revoked sooner.    Influenza A by PCR NEGATIVE NEGATIVE Final   Influenza B by PCR NEGATIVE NEGATIVE Final    Comment: (NOTE) The Xpert Xpress SARS-CoV-2/FLU/RSV assay is intended as an aid in  the diagnosis of influenza from Nasopharyngeal swab specimens and  should not be used as a sole basis for treatment. Nasal washings and  aspirates are unacceptable for Xpert Xpress SARS-CoV-2/FLU/RSV  testing. Fact Sheet for Patients: PinkCheek.be Fact Sheet for Healthcare Providers: GravelBags.it This test is not yet approved or cleared by the [place] FDA and  has been authorized for detection and/or diagnosis of SARS-CoV-2 by  FDA under an Emergency Use Authorization (EUA). This EUA will remain  in effect (meaning this test can be used) for the duration of the  Covid-19 declaration under Section 564(b)(1) of the Act, 21  U.S.C. section 360bbb-3(b)(1), unless the authorization is  terminated or revoked. Performed at [facility], [address], [place], [place] [address]   Blood Culture (routine x 2)     Status: None   Collection Time: 05/14/19  12:12 PM   Specimen: BLOOD  Result Value Ref Range Status    Specimen Description BLOOD DRAWN BY IV THERAPY  Final   Special Requests   Final    BOTTLES DRAWN AEROBIC AND ANAEROBIC Blood Culture adequate volume   Culture   Final    NO GROWTH 5 DAYS Performed at [facility], [address], [place], [place] [address]    Report Status 05/19/2019 FINAL  Final  Blood Culture (routine x 2)     Status: None   Collection Time: 05/14/19 12:20 PM   Specimen: BLOOD RIGHT HAND  Result Value Ref Range Status   Specimen Description BLOOD RIGHT HAND  Final   Special Requests   Final    BOTTLES DRAWN AEROBIC AND ANAEROBIC Blood Culture results may not be optimal due to an inadequate volume of blood received in culture bottles   Culture   Final    NO GROWTH 5 DAYS Performed at [facility], [address], [place], [place] [address]    Report Status 05/19/2019 FINAL  Final  MRSA PCR Screening     Status: None   Collection Time: 05/14/19 12:40 PM   Specimen: Nasal Mucosa; Nasopharyngeal  Result Value Ref Range Status   MRSA by PCR NEGATIVE NEGATIVE Final    Comment:        The GeneXpert MRSA Assay (FDA approved for NASAL specimens only), is one component of a comprehensive MRSA colonization surveillance program. It is not intended to diagnose MRSA infection nor to guide or monitor treatment for MRSA infections. Performed at [facility], [address] [place], [place] [address]   Culture, blood (Routine X 2) w Reflex to ID Panel     Status: None (Preliminary result)   Collection Time: 05/15/19  5:11 PM   Specimen: BLOOD RIGHT WRIST  Result Value Ref Range Status   Specimen Description BLOOD RIGHT WRIST  Final   Special Requests   Final    BOTTLES DRAWN AEROBIC AND ANAEROBIC Blood Culture adequate volume   Culture   Final    NO GROWTH 4 DAYS Performed at [facility], [address] [place], [place] [address]    Report Status PENDING  Incomplete  Culture, blood (Routine X 2) w Reflex to ID Panel     Status: None (Preliminary result)    Collection Time: 05/15/19  5:11 PM   Specimen: BLOOD RIGHT ARM  Result Value Ref Range Status   Specimen Description BLOOD RIGHT ARM  Final   Special Requests   Final    BOTTLES DRAWN AEROBIC AND ANAEROBIC Blood Culture adequate volume   Culture   Final    NO GROWTH 4 DAYS Performed at [facility], [address], [place], [place] [address]    Report Status PENDING  Incomplete  Aerobic/Anaerobic Culture (surgical/deep wound)     Status: None (Preliminary result)   Collection Time: 05/19/19  1:15 AM   Specimen: PATH Other; Abscess  Result Value Ref Range Status   Specimen Description ABSCESS LEFT GROIN  Final   Special Requests NONE  Final   Gram Stain   Final    FEW WBC PRESENT, PREDOMINANTLY PMN ABUNDANT GRAM NEGATIVE RODS FEW GRAM POSITIVE COCCI FEW GRAM POSITIVE RODS Performed at [facility], [place] [address] [place], [place] [address]    Culture PENDING  Incomplete   Report Status PENDING  Incomplete         Radiology Studies: CT ABDOMEN PELVIS WO CONTRAST  Result Date: 05/18/2019  CLINICAL DATA:  Lower abdominal pain. Sepsis. Left groin cellulitis. EXAM: CT CHEST, ABDOMEN AND PELVIS WITHOUT CONTRAST TECHNIQUE: Multidetector CT imaging of the chest, abdomen and pelvis was performed following the standard protocol without IV contrast. COMPARISON:  Pelvis CT on 05/15/2019, and AP CT on 05/14/2019 FINDINGS: CT CHEST FINDINGS Cardiovascular: No acute findings. Aortic and coronary artery atherosclerosis noted. Mediastinum/Lymph Nodes: No masses or pathologically enlarged lymph nodes identified on this unenhanced exam. Lungs/Pleura: Mild scarring is seen predominately involving the right upper lobe. No evidence of pulmonary infiltrate or pleural effusion. A 5 mm pulmonary nodule is seen in the right lung apex on image 23/3. Musculoskeletal: No suspicious bone lesions identified. CT ABDOMEN AND PELVIS FINDINGS Hepatobiliary: No masses visualized on this unenhanced exam.  Gallbladder is unremarkable. No evidence of biliary ductal dilatation. Pancreas: No mass or inflammatory changes identified on this unenhanced exam. Spleen: Within normal limits in size. Adrenals/Urinary Tract: No evidence of urolithiasis or hydronephrosis. Unremarkable appearance of bladder. Stomach/Bowel: No evidence of obstruction, inflammatory process, or abnormal fluid collections. Vascular/Lymphatic: No pathologically enlarged lymph nodes identified. No abdominal aortic aneurysm. Aortic atherosclerosis noted. Reproductive:  No masses or other significant abnormality. Other:  None. Musculoskeletal: No suspicious bone lesions identified. Increased edema and new subcutaneous emphysema is seen throughout the subcutaneous tissues of the left hip and proximal thigh compared to recent exams. This is highly suspicious for necrotizing fasciitis. IMPRESSION: 1. Increased edema and new emphysema throughout the subcutaneous tissues of left hip and proximal thigh, highly suspicious for necrotizing fasciitis. 2. 5 mm indeterminate right upper lobe pulmonary nodule. No follow-up needed if patient is low-risk. Non-contrast chest CT can be considered in 12 months if patient is high-risk. This recommendation follows the consensus statement: Guidelines for Management of Incidental Pulmonary Nodules Detected on CT Images: From the Fleischner Society 2017; Radiology 2017; 284:228-243. Critical Value/emergent results were called by telephone at the time of interpretation on 05/18/2019 at 6:39 pm to provider DAVID TAT , who verbally acknowledged these results. Aortic Atherosclerosis (ICD10-I70.0). Electronically Signed   By: Marlaine Hind M.D.   On: 05/18/2019 18:52   CT CHEST WO CONTRAST  Result Date: 05/18/2019 CLINICAL DATA:  Lower abdominal pain. Sepsis. Left groin cellulitis. EXAM: CT CHEST, ABDOMEN AND PELVIS WITHOUT CONTRAST TECHNIQUE: Multidetector CT imaging of the chest, abdomen and pelvis was performed following the  standard protocol without IV contrast. COMPARISON:  Pelvis CT on 05/15/2019, and AP CT on 05/14/2019 FINDINGS: CT CHEST FINDINGS Cardiovascular: No acute findings. Aortic and coronary artery atherosclerosis noted. Mediastinum/Lymph Nodes: No masses or pathologically enlarged lymph nodes identified on this unenhanced exam. Lungs/Pleura: Mild scarring is seen predominately involving the right upper lobe. No evidence of pulmonary infiltrate or pleural effusion. A 5 mm pulmonary nodule is seen in the right lung apex on image 23/3. Musculoskeletal: No suspicious bone lesions identified. CT ABDOMEN AND PELVIS FINDINGS Hepatobiliary: No masses visualized on this unenhanced exam. Gallbladder is unremarkable. No evidence of biliary ductal dilatation. Pancreas: No mass or inflammatory changes identified on this unenhanced exam. Spleen: Within normal limits in size. Adrenals/Urinary Tract: No evidence of urolithiasis or hydronephrosis. Unremarkable appearance of bladder. Stomach/Bowel: No evidence of obstruction, inflammatory process, or abnormal fluid collections. Vascular/Lymphatic: No pathologically enlarged lymph nodes identified. No abdominal aortic aneurysm. Aortic atherosclerosis noted. Reproductive:  No masses or other significant abnormality. Other:  None. Musculoskeletal: No suspicious bone lesions identified. Increased edema and new subcutaneous emphysema is seen throughout the subcutaneous tissues of the left hip and proximal thigh compared  to recent exams. This is highly suspicious for necrotizing fasciitis. IMPRESSION: 1. Increased edema and new emphysema throughout the subcutaneous tissues of left hip and proximal thigh, highly suspicious for necrotizing fasciitis. 2. 5 mm indeterminate right upper lobe pulmonary nodule. No follow-up needed if patient is low-risk. Non-contrast chest CT can be considered in 12 months if patient is high-risk. This recommendation follows the consensus statement: Guidelines for  Management of Incidental Pulmonary Nodules Detected on CT Images: From the Fleischner Society 2017; Radiology 2017; 284:228-243. Critical Value/emergent results were called by telephone at the time of interpretation on 05/18/2019 at 6:39 pm to provider DAVID TAT , who verbally acknowledged these results. Aortic Atherosclerosis (ICD10-I70.0). Electronically Signed   By: Marlaine Hind M.D.   On: 05/18/2019 18:52        Scheduled Meds:  aspirin  81 mg Oral Daily   Chlorhexidine Gluconate Cloth  6 each Topical Daily   clopidogrel  75 mg Oral Q breakfast   heparin  5,000 Units Subcutaneous Q8H   insulin aspart  0-15 Units Subcutaneous TID WC   insulin aspart  0-5 Units Subcutaneous QHS   insulin glargine  10 Units Subcutaneous Daily   polyethylene glycol  17 g Oral Daily   Continuous Infusions:  sodium chloride 250 mL (05/18/19 1158)   ceFEPime (MAXIPIME) IV 2 g (05/19/19 0310)   metronidazole 500 mg (05/19/19 0412)   vancomycin 1,250 mg (05/18/19 1505)     LOS: 5 days     Georgette Shell, MD  05/19/2019, 9:27 AM

## 2019-05-19 NOTE — Op Note (Signed)
°  05/19/2019  1:03 AM  PATIENT:  [name]  64 y.o. male  PRE-OPERATIVE DIAGNOSIS: Necrotizing soft tissue infection left groin  POST-OPERATIVE DIAGNOSIS: Necrotizing soft tissue infection left groin  PROCEDURE:  Procedure(s): Debridement left groin 32 cm x 6 cm x 4 cm deep including skin, subcutaneous fat, and fascia  SURGEON:  Surgeon(s): Georganna Skeans, MD  ASSISTANTS: None  ANESTHESIA:   general  EBL:  Total I/O In: -  Out: 200 [Blood:200]  BLOOD ADMINISTERED:none  DRAINS: none   SPECIMEN:  Excision  DISPOSITION OF SPECIMEN:  PATHOLOGY  COUNTS:  YES  DICTATION: Viviann Spare Dictation Excisional debridement:  1.  Progress note or procedure note with a detailed description of the procedure: Informed consent was obtained.  He is on IV antibiotics.  He was brought to the operating room and general endotracheal anesthesia was administered by the anesthesia staff.  His lower abdomen left groin and thigh were prepped and draped in sterile fashion.  We did a timeout procedure.  I used cautery to make a large curvilinear incision from his medial thigh up towards his upper lateral thigh.  This was made into his lip and the lips which was curvilinear to encompass the dead tissue visible at the skin level.  This tissue was excised but I entered multiple pockets of frankly necrotic tissue and purulent fluid.  This was sent for culture.  I did extensive debridement of skin subcutaneous tissue and some small areas of fascia until all of the nonviable appearing tissue was removed.  I got hemostasis with cautery.  I irrigated the wound with multiple liters of saline.  I then did further debridement and extended the incision down medially as I found another pocket of devitalized tissue with purulent fluid.  This was thoroughly debrided and irrigated out.  The final area was 32 cm x 6 cm x 4 cm deep.  I got good hemostasis.  I packed the wound with saline soaked Kerlix and a bulky sterile  dressing was applied.  He tolerated the procedure well and was taken recovery in guarded condition.  2.  Tool used for debridement (curette, scapel, etc.) cautery and scissors  3.  Frequency of surgical debridement.   First time  4.  Measurement of total devitalized tissue (wound surface) before and after surgical debridement.   32 cm x 6 cm x 4 cm deep   5.  Area and depth of devitalized tissue removed from wound.  32 cm x 6 cm x 4 cm deep   6.  Blood loss and description of tissue removed.  Necrotic tissue with pockets of pus, EBL 200  7.  Evidence of the progress of the wound's response to treatment.  A.  Current wound volume (current dimensions and depth).  See above  B.  Presence (and extent of) of infection.  Infection present  C.  Presence (and extent of) of non viable tissue.  Visible nonviable tissue removed  D.  Other material in the wound that is expected to inhibit healing.  No  8.  Was there any viable tissue removed (measurements): Some scattered within the above specimen  PATIENT DISPOSITION:  PACU - hemodynamically stable.   Delay start of Pharmacological VTE agent (>24hrs) due to surgical blood loss or risk of bleeding:  no  Georganna Skeans, MD, MPH, FACS Pager: [phone]  5/9/20211:03 AM

## 2019-05-19 NOTE — Progress Notes (Signed)
Pharmacy Antibiotic Note  [name] is a 64 y.o. male admitted on 05/14/2019 with infection of unknown source. Pharmacy has been consulted for vancomycin and cefepime dosing.   SCr has improved, vancomycin trough subtherapeutic at 10 mcg/ml.  Plan: Increase vancomycin to 1000mg  IV q12h Continue cefepime 2g IV q8h Watch renal function   Height: 5\' 9"  (175.3 cm) Weight: 103.6 kg (228 lb 6.3 oz) IBW/kg (Calculated) : 70.7  Temp (24hrs), Avg:98.2 F (36.8 C), Min:97.8 F (36.6 C), Max:98.4 F (36.9 C)    No Known Allergies  Antimicrobials this admission: Cefepime 5/4>>    Vancomycin 5/4 >>  Microbiology results: 5/4 BC x2:  ngtd 5/4 UCx: ng 5/4 Resp PCR: SARS CoV-2:  neg      5/4 MRSA PCR:  negative   Thank you for allowing pharmacy to be a part of this patient's care.  Arrie Senate, PharmD, BCPS Clinical Pharmacist [phone] Please check AMION for all [facility] numbers 05/19/2019

## 2019-05-19 NOTE — Anesthesia Postprocedure Evaluation (Signed)
Anesthesia Post Note  Patient: [name]  Procedure(s) Performed: DEBRIDEMENT LEFT GROIN (Left Groin)     Patient location during evaluation: PACU Anesthesia Type: General Level of consciousness: awake and alert Pain management: pain level controlled Vital Signs Assessment: post-procedure vital signs reviewed and stable Respiratory status: spontaneous breathing, nonlabored ventilation, respiratory function stable and patient connected to nasal cannula oxygen Cardiovascular status: blood pressure returned to baseline and stable Postop Assessment: no apparent nausea or vomiting Anesthetic complications: no    Last Vitals:  Vitals:   05/19/19 0146 05/19/19 0201  BP: 100/71 103/72  Pulse: 95 93  Resp: (!) 22 (!) 29  Temp:  36.7 C  SpO2: 98% 99%    Last Pain:  Vitals:   05/19/19 0201  TempSrc:   PainSc: 0-No pain                 Catalina Gravel

## 2019-05-19 NOTE — Anesthesia Preprocedure Evaluation (Signed)
Anesthesia Evaluation  Patient identified by MRN, date of birth, ID band Patient awake    Reviewed: Allergy & Precautions, NPO status , Patient's Chart, lab work & pertinent test results, reviewed documented beta blocker date and time   History of Anesthesia Complications Negative for: history of anesthetic complications  Airway Mallampati: II  TM Distance: >3 FB Neck ROM: Full    Dental  (+) Dental Advisory Given, Chipped, Poor Dentition, Missing, Edentulous Upper   Pulmonary sleep apnea , former smoker,    Pulmonary exam normal breath sounds clear to auscultation       Cardiovascular hypertension, Pt. on medications and Pt. on home beta blockers + angina + CAD, + Past MI, + Cardiac Stents, + Peripheral Vascular Disease and +CHF  Normal cardiovascular exam Rhythm:Regular Rate:Normal  Echo 3/21: 1. There is apical akinesis. Definity contrast reveals an apical filling  defect that is consistent with thrombus / early thrombus formation . Marland Kitchen  Left ventricular ejection fraction, by estimation, is 30 to 35%. The left  ventricle has moderately decreased  function. The left ventricle demonstrates regional wall motion  abnormalities (see scoring diagram/findings for description). Left  ventricular diastolic parameters are consistent with Grade I diastolic  dysfunction (impaired relaxation). There is severe  akinesis of the left ventricular, mid-apical anteroseptal wall, lateral  wall and inferior wall.  2. Right ventricular systolic function is normal. The right ventricular  size is normal.  3. The mitral valve is normal in structure and function. No evidence of  mitral valve regurgitation. No evidence of mitral stenosis.  4. The aortic valve is grossly normal. Aortic valve regurgitation is not  visualized. No aortic stenosis is present.    Neuro/Psych  Headaches, negative psych ROS   GI/Hepatic Neg liver ROS, GERD  ,   Endo/Other  diabetes, Type 2, Oral Hypoglycemic Agents, Insulin DependentObesity   Renal/GU Renal InsufficiencyRenal disease     Musculoskeletal negative musculoskeletal ROS (+)   Abdominal   Peds  Hematology  (+) Blood dyscrasia (Plavix), ,   Anesthesia Other Findings Day of surgery medications reviewed with the patient.  Reproductive/Obstetrics                             Anesthesia Physical Anesthesia Plan  ASA: IV and emergent  Anesthesia Plan: General   Post-op Pain Management:    Induction: Intravenous  PONV Risk Score and Plan: 2 and Dexamethasone, Ondansetron and Treatment may vary due to age or medical condition  Airway Management Planned: Oral ETT  Additional Equipment:   Intra-op Plan:   Post-operative Plan: Extubation in OR  Informed Consent: I have reviewed the patients History and Physical, chart, labs and discussed the procedure including the risks, benefits and alternatives for the proposed anesthesia with the patient or authorized representative who has indicated his/her understanding and acceptance.     Dental advisory given  Plan Discussed with: CRNA  Anesthesia Plan Comments:         Anesthesia Quick Evaluation

## 2019-05-19 NOTE — Progress Notes (Signed)
Progress Note: General Surgery Service   Chief Complaint/Subjective: OR yesterday for necrotizing soft tissue infection, pain issues this morning  Objective: Vital signs in last 24 hours: Temp:  [97.4 F (36.3 C)-98.4 F (36.9 C)] 98.4 F (36.9 C) (05/09 0709) Pulse Rate:  [76-95] 76 (05/09 0709) Resp:  [20-33] 20 (05/09 0709) BP: (93-111)/(60-83) 110/75 (05/09 0709) SpO2:  [89 %-100 %] 89 % (05/09 0709) Last BM Date: 05/18/19  Intake/Output from previous day: 05/08 0701 - 05/09 0700 In: 1795.8 [P.O.:240; I.V.:855.8; IV Piggyback:700] Out: 1005 [Urine:805; Blood:200] Intake/Output this shift: No intake/output data recorded.  Gen: NAD  Resp: nonlabored  Card: RRR  Abd: soft, NT  Ext: left groin large open wound, edges look viable  Lab Results: CBC  Recent Labs    05/18/19 0448 05/19/19 0629  WBC 19.1* 23.6*  HGB 13.3 11.7*  HCT 41.2 36.5*  PLT 285 273   BMET Recent Labs    05/18/19 0448 05/19/19 0629  NA 134* 134*  K 3.5 4.0  CL 101 101  CO2 23 20*  GLUCOSE 179* 143*  BUN 23 29*  CREATININE 1.21 1.33*  CALCIUM 7.6* 7.3*   PT/INR No results for input(s): LABPROT, INR in the last 72 hours. ABG No results for input(s): PHART, HCO3 in the last 72 hours.  Invalid input(s): PCO2, PO2  Anti-infectives: Anti-infectives (From admission, onward)   Start     Dose/Rate Route Frequency Ordered Stop   05/18/19 2000  metroNIDAZOLE (FLAGYL) IVPB 500 mg     500 mg 100 mL/hr over 60 Minutes Intravenous Every 8 hours 05/18/19 1913     05/18/19 1200  ceFEPIme (MAXIPIME) 2 g in sodium chloride 0.9 % 100 mL IVPB     2 g 200 mL/hr over 30 Minutes Intravenous Every 8 hours 05/18/19 1013     05/15/19 1500  vancomycin (VANCOREADY) IVPB 1250 mg/250 mL     1,250 mg 166.7 mL/hr over 90 Minutes Intravenous Every 24 hours 05/14/19 1447     05/15/19 0000  ceFEPIme (MAXIPIME) 2 g in sodium chloride 0.9 % 100 mL IVPB  Status:  Discontinued     2 g 200 mL/hr over 30  Minutes Intravenous Every 12 hours 05/14/19 1424 05/18/19 1013   05/14/19 1300  vancomycin (VANCOREADY) IVPB 2000 mg/400 mL     2,000 mg 200 mL/hr over 120 Minutes Intravenous  Once 05/14/19 1223 05/14/19 1506   05/14/19 1200  ceFEPIme (MAXIPIME) 2 g in sodium chloride 0.9 % 100 mL IVPB     2 g 200 mL/hr over 30 Minutes Intravenous  Once 05/14/19 1158 05/14/19 1258   05/14/19 1200  vancomycin (VANCOCIN) IVPB 1000 mg/200 mL premix  Status:  Discontinued     1,000 mg 200 mL/hr over 60 Minutes Intravenous  Once 05/14/19 1158 05/14/19 1223      Medications: Scheduled Meds: . aspirin  81 mg Oral Daily  . Chlorhexidine Gluconate Cloth  6 each Topical Daily  . feeding supplement (GLUCERNA SHAKE)  237 mL Oral TID BM  . heparin  5,000 Units Subcutaneous Q8H  . insulin aspart  0-15 Units Subcutaneous TID WC  . insulin aspart  0-5 Units Subcutaneous QHS  . insulin glargine  10 Units Subcutaneous Daily  . polyethylene glycol  17 g Oral Daily   Continuous Infusions: . sodium chloride 250 mL (05/18/19 1158)  . ceFEPime (MAXIPIME) IV 2 g (05/19/19 0310)  . metronidazole 500 mg (05/19/19 0412)  . vancomycin 1,250 mg (05/18/19 1505)   PRN  Meds:.sodium chloride, acetaminophen **OR** acetaminophen, alum & mag hydroxide-simeth, ondansetron **OR** ondansetron (ZOFRAN) IV, simethicone  Assessment/Plan: s/p Procedure(s): DEBRIDEMENT LEFT GROIN 05/19/2019 -continue abx -carb mod diet today -NPO after midnight -hold plavix while back and forth to OR -ok to continue aspirin -plan for OR tomorrow for washout possible additional debridement. Discussed plan with patient who agrees to proceed. -add narcotics to pain med options    LOS: 5 days   Mickeal Skinner, MD [facility]

## 2019-05-19 NOTE — Transfer of Care (Signed)
Immediate Anesthesia Transfer of Care Note  Patient: [name]  Procedure(s) Performed: DEBRIDEMENT LEFT GROIN (Left Groin)  Patient Location: PACU  Anesthesia Type:General  Level of Consciousness: awake, drowsy, patient cooperative and responds to stimulation  Airway & Oxygen Therapy: Patient Spontanous Breathing and Patient connected to nasal cannula oxygen  Post-op Assessment: Report given to RN, Post -op Vital signs reviewed and stable and Patient moving all extremities X 4  Post vital signs: Reviewed and stable  Last Vitals:  Vitals Value Taken Time  BP 111/60 05/19/19 0116  Temp    Pulse 93 05/19/19 0119  Resp 33 05/19/19 0119  SpO2 97 % 05/19/19 0119  Vitals shown include unvalidated device data.  Last Pain:  Vitals:   05/18/19 2224  TempSrc: Oral  PainSc:       Patients Stated Pain Goal: 0 ([id number])  Complications: No apparent anesthesia complications

## 2019-05-19 NOTE — Anesthesia Procedure Notes (Signed)
Procedure Name: Intubation Date/Time: 05/19/2019 12:22 AM Performed by: Claris Che, CRNA Pre-anesthesia Checklist: Patient identified, Emergency Drugs available, Suction available, Patient being monitored and Timeout performed Patient Re-evaluated:Patient Re-evaluated prior to induction Oxygen Delivery Method: Circle system utilized Preoxygenation: Pre-oxygenation with 100% oxygen Induction Type: IV induction, Rapid sequence and Cricoid Pressure applied Laryngoscope Size: Mac and 4 Grade View: Grade I Tube type: Oral Tube size: 7.5 mm Number of attempts: 1 Airway Equipment and Method: Stylet Placement Confirmation: ETT inserted through vocal cords under direct vision,  positive ETCO2 and breath sounds checked- equal and bilateral Secured at: 23 cm Tube secured with: Tape Dental Injury: Teeth and Oropharynx as per pre-operative assessment

## 2019-05-20 ENCOUNTER — Encounter (HOSPITAL_COMMUNITY)
Admission: EM | Disposition: A | Discharge status: Skilled Nursing Facility | Payer: Self-pay | Source: Home / Self Care | Attending: Internal Medicine

## 2019-05-20 ENCOUNTER — Inpatient Hospital Stay (HOSPITAL_COMMUNITY): Payer: No Typology Code available for payment source | Admitting: Certified Registered Nurse Anesthetist

## 2019-05-20 HISTORY — PX: WOUND DEBRIDEMENT: SHX247

## 2019-05-20 LAB — GLUCOSE, CAPILLARY
Glucose-Capillary: 167 mg/dL — ABNORMAL HIGH (ref 70–99)
Glucose-Capillary: 132 mg/dL — ABNORMAL HIGH (ref 70–99)
Glucose-Capillary: 151 mg/dL — ABNORMAL HIGH (ref 70–99)
Glucose-Capillary: 165 mg/dL — ABNORMAL HIGH (ref 70–99)
Glucose-Capillary: 209 mg/dL — ABNORMAL HIGH (ref 70–99)

## 2019-05-20 LAB — COMPREHENSIVE METABOLIC PANEL
ALT: 14 U/L (ref 0–44)
AST: 13 U/L — ABNORMAL LOW (ref 15–41)
Albumin: 1.3 g/dL — ABNORMAL LOW (ref 3.5–5.0)
Alkaline Phosphatase: 57 U/L (ref 38–126)
Anion gap: 7 (ref 5–15)
BUN: 22 mg/dL (ref 8–23)
CO2: 23 mmol/L (ref 22–32)
Calcium: 6.8 mg/dL — ABNORMAL LOW (ref 8.9–10.3)
Chloride: 106 mmol/L (ref 98–111)
Creatinine, Ser: 1.31 mg/dL — ABNORMAL HIGH (ref 0.61–1.24)
GFR calc Af Amer: >60 mL/min (ref >60)
GFR calc non Af Amer: 57 mL/min — ABNORMAL LOW (ref >60)
Glucose, Bld: 145 mg/dL — ABNORMAL HIGH (ref 70–99)
Potassium: 3.2 mmol/L — ABNORMAL LOW (ref 3.5–5.1)
Sodium: 136 mmol/L (ref 135–145)
Total Bilirubin: 0.6 mg/dL (ref 0.3–1.2)
Total Protein: 5.3 g/dL — ABNORMAL LOW (ref 6.5–8.1)

## 2019-05-20 LAB — CBC
HCT: 30.8 % — ABNORMAL LOW (ref 39.0–52.0)
Hemoglobin: 9.9 g/dL — ABNORMAL LOW (ref 13.0–17.0)
MCH: 26 pg (ref 26.0–34.0)
MCHC: 32.1 g/dL (ref 30.0–36.0)
MCV: 80.8 fL (ref 80.0–100.0)
Platelets: 267 10*3/uL (ref 150–400)
RBC: 3.81 MIL/uL — ABNORMAL LOW (ref 4.22–5.81)
RDW: 16.9 % — ABNORMAL HIGH (ref 11.5–15.5)
WBC: 26.8 10*3/uL — ABNORMAL HIGH (ref 4.0–10.5)
nRBC: 0.4 % — ABNORMAL HIGH (ref 0.0–0.2)

## 2019-05-20 LAB — CULTURE, BLOOD (ROUTINE X 2)
Culture: NO GROWTH
Culture: NO GROWTH
Special Requests: ADEQUATE
Special Requests: ADEQUATE

## 2019-05-20 LAB — MAGNESIUM: Magnesium: 2 mg/dL (ref 1.7–2.4)

## 2019-05-20 SURGERY — DEBRIDEMENT, WOUND
Anesthesia: General | Site: Groin | Laterality: Left

## 2019-05-20 MED ORDER — KETAMINE HCL 50 MG/5ML IJ SOSY
PREFILLED_SYRINGE | INTRAMUSCULAR | Status: AC
  Filled 2019-05-20: qty 5

## 2019-05-20 MED ORDER — FENTANYL CITRATE (PF) 100 MCG/2ML IJ SOLN
25.0000 ug | INTRAMUSCULAR | Status: DC | PRN
  Administered 2019-05-20 (×2): 50 ug via INTRAVENOUS

## 2019-05-20 MED ORDER — ROCURONIUM BROMIDE 10 MG/ML (PF) SYRINGE
PREFILLED_SYRINGE | INTRAVENOUS | Status: AC
  Filled 2019-05-20: qty 10

## 2019-05-20 MED ORDER — DEXAMETHASONE SODIUM PHOSPHATE 10 MG/ML IJ SOLN
INTRAMUSCULAR | Status: AC
  Filled 2019-05-20: qty 1

## 2019-05-20 MED ORDER — FENTANYL CITRATE (PF) 250 MCG/5ML IJ SOLN
INTRAMUSCULAR | Status: AC
  Filled 2019-05-20: qty 5

## 2019-05-20 MED ORDER — FENTANYL CITRATE (PF) 100 MCG/2ML IJ SOLN
INTRAMUSCULAR | Status: DC | PRN
  Administered 2019-05-20: 50 ug via INTRAVENOUS

## 2019-05-20 MED ORDER — HYDROMORPHONE HCL 1 MG/ML IJ SOLN
INTRAMUSCULAR | Status: AC
  Filled 2019-05-20: qty 1

## 2019-05-20 MED ORDER — 0.9 % SODIUM CHLORIDE (POUR BTL) OPTIME
TOPICAL | Status: DC | PRN
  Administered 2019-05-20: 1000 mL

## 2019-05-20 MED ORDER — OXYCODONE HCL 5 MG PO TABS
5.0000 mg | ORAL_TABLET | Freq: Four times a day (QID) | ORAL | Status: DC | PRN
  Administered 2019-05-21 – 2019-05-24 (×10): 10 mg via ORAL
  Administered 2019-05-24: 5 mg via ORAL
  Administered 2019-05-25 – 2019-05-27 (×7): 10 mg via ORAL
  Filled 2019-05-20 (×15): qty 2
  Filled 2019-05-20: qty 1
  Filled 2019-05-20 (×5): qty 2

## 2019-05-20 MED ORDER — ONDANSETRON HCL 4 MG/2ML IJ SOLN
INTRAMUSCULAR | Status: AC
  Filled 2019-05-20: qty 2

## 2019-05-20 MED ORDER — LIDOCAINE 2% (20 MG/ML) 5 ML SYRINGE
INTRAMUSCULAR | Status: AC
  Filled 2019-05-20: qty 5

## 2019-05-20 MED ORDER — ONDANSETRON HCL 4 MG/2ML IJ SOLN
INTRAMUSCULAR | Status: DC | PRN
  Administered 2019-05-20: 4 mg via INTRAVENOUS

## 2019-05-20 MED ORDER — LACTATED RINGERS IV SOLN: INTRAVENOUS | Status: DC | PRN

## 2019-05-20 MED ORDER — SUGAMMADEX SODIUM 200 MG/2ML IV SOLN
INTRAVENOUS | Status: DC | PRN
  Administered 2019-05-20: 400 mg via INTRAVENOUS

## 2019-05-20 MED ORDER — HYDROMORPHONE HCL 1 MG/ML IJ SOLN
0.2500 mg | INTRAMUSCULAR | Status: AC | PRN
  Administered 2019-05-20 (×2): 0.25 mg via INTRAVENOUS

## 2019-05-20 MED ORDER — PHENYLEPHRINE HCL-NACL 10-0.9 MG/250ML-% IV SOLN
INTRAVENOUS | Status: DC | PRN
  Administered 2019-05-20: 20 ug/min via INTRAVENOUS

## 2019-05-20 MED ORDER — FENTANYL CITRATE (PF) 100 MCG/2ML IJ SOLN
INTRAMUSCULAR | Status: AC
  Filled 2019-05-20: qty 2

## 2019-05-20 MED ORDER — PROPOFOL 10 MG/ML IV BOLUS
INTRAVENOUS | Status: DC | PRN
  Administered 2019-05-20: 100 mg via INTRAVENOUS

## 2019-05-20 MED ORDER — MIDAZOLAM HCL 2 MG/2ML IJ SOLN
INTRAMUSCULAR | Status: AC
  Filled 2019-05-20: qty 2

## 2019-05-20 MED ORDER — PROPOFOL 10 MG/ML IV BOLUS
INTRAVENOUS | Status: AC
  Filled 2019-05-20: qty 20

## 2019-05-20 MED ORDER — HYDROMORPHONE HCL 1 MG/ML IJ SOLN
0.2500 mg | INTRAMUSCULAR | Status: DC | PRN
  Administered 2019-05-20: 0.5 mg via INTRAVENOUS

## 2019-05-20 MED ORDER — VASOPRESSIN 20 UNIT/ML IV SOLN
INTRAVENOUS | Status: AC
  Filled 2019-05-20: qty 1

## 2019-05-20 MED ORDER — KETAMINE HCL 10 MG/ML IJ SOLN
INTRAMUSCULAR | Status: DC | PRN
Start: 2019-05-20 — End: 2019-05-20
  Administered 2019-05-20 (×2): 10 mg via INTRAVENOUS

## 2019-05-20 MED ORDER — LINEZOLID 600 MG/300ML IV SOLN
600.0000 mg | Freq: Two times a day (BID) | INTRAVENOUS | Status: DC
  Administered 2019-05-20 – 2019-05-23 (×9): 600 mg via INTRAVENOUS
  Filled 2019-05-20 (×11): qty 300

## 2019-05-20 MED ORDER — ROCURONIUM BROMIDE 10 MG/ML (PF) SYRINGE
PREFILLED_SYRINGE | INTRAVENOUS | Status: DC | PRN
  Administered 2019-05-20: 100 mg via INTRAVENOUS

## 2019-05-20 MED ORDER — LIDOCAINE 2% (20 MG/ML) 5 ML SYRINGE
INTRAMUSCULAR | Status: DC | PRN
  Administered 2019-05-20: 60 mg via INTRAVENOUS

## 2019-05-20 MED ORDER — STERILE WATER FOR IRRIGATION IR SOLN
Status: DC | PRN
  Administered 2019-05-20: 1000 mL

## 2019-05-20 MED ORDER — VASOPRESSIN 20 UNIT/ML IV SOLN
INTRAVENOUS | Status: DC | PRN
  Administered 2019-05-20 (×2): 1 [IU] via INTRAVENOUS

## 2019-05-20 MED ORDER — MIDAZOLAM HCL 2 MG/2ML IJ SOLN
INTRAMUSCULAR | Status: DC | PRN
  Administered 2019-05-20: 1 mg via INTRAVENOUS

## 2019-05-20 SURGICAL SUPPLY — 30 items
BNDG GAUZE ELAST 4 BULKY (GAUZE/BANDAGES/DRESSINGS) ×6 IMPLANT
CANISTER SUCT 3000ML PPV (MISCELLANEOUS) ×3 IMPLANT
COVER SURGICAL LIGHT HANDLE (MISCELLANEOUS) ×3 IMPLANT
COVER WAND RF STERILE (DRAPES) ×3 IMPLANT
DRAPE LAPAROSCOPIC ABDOMINAL (DRAPES) ×2 IMPLANT
DRSG PAD ABDOMINAL 8X10 ST (GAUZE/BANDAGES/DRESSINGS) ×6 IMPLANT
ELECT REM PT RETURN 9FT ADLT (ELECTROSURGICAL) ×3
ELECTRODE REM PT RTRN 9FT ADLT (ELECTROSURGICAL) ×1 IMPLANT
GAUZE SPONGE 4X4 12PLY STRL (GAUZE/BANDAGES/DRESSINGS) IMPLANT
GLOVE BIOGEL PI IND STRL 7.0 (GLOVE) ×1 IMPLANT
GLOVE BIOGEL PI INDICATOR 7.0 (GLOVE) ×2
GLOVE SURG SS PI 7.0 STRL IVOR (GLOVE) ×3 IMPLANT
GOWN STRL REUS W/ TWL LRG LVL3 (GOWN DISPOSABLE) ×2 IMPLANT
GOWN STRL REUS W/TWL LRG LVL3 (GOWN DISPOSABLE) ×6
HANDPIECE INTERPULSE COAX TIP (DISPOSABLE) ×3
KIT BASIN OR (CUSTOM PROCEDURE TRAY) ×3 IMPLANT
KIT TURNOVER KIT B (KITS) ×3 IMPLANT
NEEDLE 22X1 1/2 (OR ONLY) (NEEDLE) ×3 IMPLANT
NS IRRIG 1000ML POUR BTL (IV SOLUTION) ×3 IMPLANT
PACK GENERAL/GYN (CUSTOM PROCEDURE TRAY) ×3 IMPLANT
PAD ARMBOARD 7.5X6 YLW CONV (MISCELLANEOUS) ×3 IMPLANT
PENCIL SMOKE EVACUATOR (MISCELLANEOUS) ×3 IMPLANT
SET HNDPC FAN SPRY TIP SCT (DISPOSABLE) IMPLANT
SPONGE LAP 18X18 RF (DISPOSABLE) ×2 IMPLANT
SUT SILK 3 0 SH 30 (SUTURE) ×2 IMPLANT
TAPE CLOTH SURG 6X10 WHT LF (GAUZE/BANDAGES/DRESSINGS) ×2 IMPLANT
TOWEL GREEN STERILE (TOWEL DISPOSABLE) ×3 IMPLANT
TOWEL GREEN STERILE FF (TOWEL DISPOSABLE) ×3 IMPLANT
TUBE CONNECTING 12'X1/4 (SUCTIONS) ×1
TUBE CONNECTING 12X1/4 (SUCTIONS) ×1 IMPLANT

## 2019-05-20 NOTE — Op Note (Signed)
Preoperative diagnosis: necrotizing soft tissue infection  Postoperative diagnosis: same   Procedure: debridement of 15 x 10 cm skin, subcutaneous tissue with some fascial resection  Surgeon: Gurney Maxin, M.D.  Asst: none  Anesthesia: general  Indications for procedure: [name] is a 64 y.o. year old male with symptoms of left groin infection that underwent large debridement 2 days ago.   Description of procedure: The patient was brought into the operative suite. Anesthesia was administered with General LMA anesthesia. WHO checklist was applied. The patient was then placed in supine position. The area was prepped and draped in the usual sterile fashion.  Most of the tissues overlying the muscle looked viable however when I lifted the proximal skin flap there was additional ischemic and necrotic tissue.  This was sharply debrided.  And then probed the extent of the wound and found to additional cavities one in the left medial thigh which drained dark gray infected matter and one in the proximal medial groin which can drain to dark infected matter in the cavity tunneled across the groin crease.  Then gently sharply debrided additional areas of the wound using a curette and cautery.  Then used a pulse lavage to further irrigate and debride tissue.  The groin crease cavity was then opened with cautery going through couple vessels which were ligated with 3-0 silk.  I took additional distal skin as there is too large of a flap to ease wound care burden and to avoid late necrosis of skin flaps.  Total skin removed was 15 x 10 cm.  There are 2 areas of fascia which was questionable so I removed small amount of fascia approximately 5 cm x 2 cm underlying muscle appeared viable.  Hemostasis was then applied with cautery and a few skin edged areas.  3 salinated Kerlix rolls were placed over the wound.  ABD pads were placed over as additional dressing.  Patient will from anesthesia brought to PACU in  stable condition.  All counts were correct.  Findings: additional necrotic tissue with new collections in the left medial thigh and left proximal groin  Specimen: none  Implant: 3 kerlix rolls   Blood loss: 120 ml  Local anesthesia: none  Complications: none  Will plan to return to OR in 2 days for additional wash out, dressing change, and possible further debridement.  Gurney Maxin, M.D. General, Bariatric, & Minimally Invasive Surgery [facility]

## 2019-05-20 NOTE — Anesthesia Procedure Notes (Signed)
Procedure Name: Intubation Date/Time: 05/20/2019 1:24 PM Performed by: Leonor Liv, CRNA Pre-anesthesia Checklist: Patient identified, Emergency Drugs available, Suction available and Patient being monitored Patient Re-evaluated:Patient Re-evaluated prior to induction Oxygen Delivery Method: Circle System Utilized Preoxygenation: Pre-oxygenation with 100% oxygen Induction Type: IV induction Ventilation: Mask ventilation without difficulty and Oral airway inserted - appropriate to patient size Laryngoscope Size: Mac and 4 Grade View: Grade I Tube type: Oral Tube size: 7.5 mm Number of attempts: 1 Airway Equipment and Method: Stylet and Oral airway Placement Confirmation: ETT inserted through vocal cords under direct vision,  positive ETCO2 and breath sounds checked- equal and bilateral Secured at: 23 cm Tube secured with: Tape Dental Injury: Teeth and Oropharynx as per pre-operative assessment

## 2019-05-20 NOTE — Progress Notes (Signed)
Pre Procedure note for inpatients:   [name] has been scheduled for Procedure(s): DEBRIDEMENT GROIN (Left) today. The various methods of treatment have been discussed with the patient. After consideration of the risks, benefits and treatment options the patient has consented to the planned procedure.   The patient has been seen and labs reviewed. There are no changes in the patient's condition to prevent proceeding with the planned procedure today.  Recent labs:  Lab Results  Component Value Date   WBC 26.8 (H) 05/20/2019   HGB 9.9 (L) 05/20/2019   HCT 30.8 (L) 05/20/2019   PLT 267 05/20/2019   GLUCOSE 145 (H) 05/20/2019   CHOL 161 10/16/2018   TRIG 117 10/16/2018   HDL 24 (L) 10/16/2018   LDLCALC 114 (H) 10/16/2018   ALT 14 05/20/2019   AST 13 (L) 05/20/2019   NA 136 05/20/2019   K 3.2 (L) 05/20/2019   CL 106 05/20/2019   CREATININE 1.31 (H) 05/20/2019   BUN 22 05/20/2019   CO2 23 05/20/2019   TSH 2.759 10/15/2018   INR 2.0 (H) 05/14/2019   HGBA1C 9.6 (H) 05/16/2019    Mickeal Skinner, MD 05/20/2019 12:49 PM

## 2019-05-20 NOTE — Anesthesia Preprocedure Evaluation (Addendum)
Anesthesia Evaluation  Patient identified by MRN, date of birth, ID band Patient awake    Reviewed: Allergy & Precautions, NPO status , Patient's Chart, lab work & pertinent test results, reviewed documented beta blocker date and time   History of Anesthesia Complications Negative for: history of anesthetic complications  Airway Mallampati: II  TM Distance: >3 FB Neck ROM: Full    Dental  (+) Dental Advisory Given, Chipped, Poor Dentition, Missing, Edentulous Upper,    Pulmonary sleep apnea , former smoker,    Pulmonary exam normal breath sounds clear to auscultation       Cardiovascular hypertension, Pt. on medications and Pt. on home beta blockers + angina + CAD, + Past MI, + Cardiac Stents, + Peripheral Vascular Disease and +CHF  Normal cardiovascular exam Rhythm:Regular Rate:Normal  Echo 3/21: 1. There is apical akinesis. Definity contrast reveals an apical filling defect that is consistent with thrombus / early thrombus formation. Left ventricular ejection fraction, by estimation, is 30 to 35%. The left ventricle has moderately decreased function. The left ventricle demonstrates regional wall motion abnormalities (see scoring diagram/findings for description). Left ventricular diastolic parameters are consistent with Grade I diastolic  dysfunction (impaired relaxation). There is severe  akinesis of the left ventricular, mid-apical anteroseptal wall, lateral wall and inferior wall.  2. Right ventricular systolic function is normal. The right ventricular size is normal.  3. The mitral valve is normal in structure and function. No evidence of mitral valve regurgitation. No evidence of mitral stenosis.  4. The aortic valve is grossly normal. Aortic valve regurgitation is not visualized. No aortic stenosis is present.    Neuro/Psych  Headaches, negative psych ROS   GI/Hepatic GERD  ,(+)     substance abuse  cocaine use,    Endo/Other  diabetes, Type 2, Oral Hypoglycemic Agents, Insulin Dependent   Renal/GU Renal InsufficiencyRenal disease (Cr 1.31, K 3.2)     Musculoskeletal negative musculoskeletal ROS (+)   Abdominal   Peds  Hematology  (+) Blood dyscrasia (Plavix, Hgb 9.9), anemia ,   Anesthesia Other Findings Recent hospitalization 2/28 - 03/12/19 under cardiology service for NSTEMI, troponin peaking at 575, requiring PCI of RCA/PDA  Admitted on 5/4 after being found unresponsive at home thought 2/2 overdose. Found to have left inguinal necrotizing fascitis.   5/9 Transferred from [facility] 2/2 worsening infection and leukocytosis  Reproductive/Obstetrics                            Anesthesia Physical  Anesthesia Plan  ASA: IV  Anesthesia Plan: General   Post-op Pain Management:    Induction: Intravenous  PONV Risk Score and Plan: 2 and Ondansetron, Treatment may vary due to age or medical condition and Midazolam  Airway Management Planned: Oral ETT  Additional Equipment:   Intra-op Plan:   Post-operative Plan: Extubation in OR  Informed Consent: I have reviewed the patients History and Physical, chart, labs and discussed the procedure including the risks, benefits and alternatives for the proposed anesthesia with the patient or authorized representative who has indicated his/her understanding and acceptance.     Dental advisory given  Plan Discussed with: CRNA  Anesthesia Plan Comments:         Anesthesia Quick Evaluation

## 2019-05-20 NOTE — Progress Notes (Signed)
PROGRESS NOTE    [name]  [id number] DOB: [birth year] DOA: 05/14/2019 PCP: System, Pcp Not In    Brief Narrative: HPI by Dr. Denton [name] on 05/14/2019 Russell Bouchard Simpsonis a 64 y.o.malewith medical history significant for CAD, DM,systolic CHF, CKD 3, hypertension, diabetes, cocaine use, OSA, peripheral vascular disease,apical mural thrombus. Patient was brought to the ED via EMSwithreports of overdose on tramadol-patient reported he had taken 3 doses of tramadol he was found in the bathtub,apparently he was soaking his whole left groin/perineal area. At that time he was awake and alert. Patient's blood pressure was soft/low, subsequently EMS satpatient up to get his vitals, there wasareport of seizure activity,patient was unresponsive and shaky/tremulous. He was given 5 mg of Versed, 500 mill bolus of fluids.  On my evaluation, patient awakens to touch and voice, but drifts back to sleep. He tells me he took just 2 doses of Ultram. Reports pain and swelling in his left groin.He denies difficulty breathing or cough, no abdominal pain, no pain with urination.  Recent hospitalization2/28 - 3/2/21under cardiology service for NSTEMI, troponin peaking at 575, requiring PCI of RCA/PDA,via cardiac cath3/1/21.On dual antiplatelets Plavix and aspirin. Echocardiogram also showed apical filling defect consistent with thrombus/early thrombus formation. Plan was to continue aspirin and Plavix for 30 days, to discontinue aspirin then and addXarelto and continue Plavix long-term.Echo showed EF of 30 to 35%, due to history of intermittent compliance, was felt not to be a candidate for ICD. Unless he could demonstrate compliance with medications and follow-up in the future.  ED Course:On arrival to the ED, patient was unresponsive, he was given 0.4 of Narcan mental status significantly improved. Blood pressure dropped to 86/65 in the ED, improved after 3.5 L sepsis fluid bolus was  given in ED. Febrile to 102.6. Initial tachycardia 104. Lactic acid was significantly elevated at 6 >> 4.3.Creatinine elevated at 2.95.Respiratory panel negative for COVID-19 and influenza.  Portable chest x-ray mild bilateral interstitial prominence, tiny bilateral pleural effusionsandmild CHF cannot be excluded. Abdominal/pelvic CT without contrast-extensive inflammatory changes in the left inguinal region extending into the anterior medial upper thigh compatible with acute cellulitis. No associated abscess is present. Also low-density left perirectal fluid collection measuring up to 25 mm. UAPending, blood and urine cultures ordered. IV Vanco and cefepime started. EDPtalked to Dr. Melvyn Novas, who felt patient was appropriate for hospitalist admission, can consultif additional support needed. Hospitalist to admit for severe sepsis.   05/19/2019 patient was transferred to [facility] yesterday with CT of the abdomen and pelvis showed necrotizing fasciitis with worsening leukocytosis.  Patient was seen by general surgery and had debridement of the left groin.  Assessment & Plan:   Active Problems:   Tobacco use disorder   Cardiomyopathy, ischemic-EF 40-45% by echo 09/07/13   Essential hypertension   Severe sepsis ([place])   Acute renal failure superimposed on stage 3b chronic kidney disease (HCC)   Cocaine abuse ([place])   Cellulitis of left groin   Uncontrolled type 2 diabetes mellitus with hyperglycemia (HCC)   Elevated lactic acid level   Cellulitis of left abdominal wall   Opiate overdose ([place])  #1 sepsis secondary to left groin necrotizing fasciitis/cellulitis-status post debridement on 05/18/2019.  He was taken again to the OR on 05/20/2019 for additional debridement.  Additional necrotic tissue with new collections in the left medial thigh and the left proximal groin was found.  Planning OR again in 2 days.   Vancomycin changed to Zyvox continue Flagyl and  cefepime.  Wbc 26.8 from 23.6 from 19.1  Wound culture abundant gram-negative rods and few gram-positive cocci and few gram-positive rods  #2 AKI on CKD stage IIIb secondary to hypotension and sepsis-Baseline creatinine 1.4-1.6.  On admission his creatinine was 2.95 down to 1.33   #3 acute hypoxic respiratory failure resolved likely secondary to COPD sepsis and hypoventilation  #4 type 2 diabetes with hyperglycemia uncontrolled A1c was 11.1 on February 2021 On Lantus 10 units with SSI CBG (last 3)  Recent Labs (last 2 labs)        Recent Labs    05/18/19 2034 05/19/19 0117 05/19/19 0713  GLUCAP 167* 120* 136*     Prior to admission he was on Jardiance 12.5 mg daily with Lantus 30 units at bedtime Glucophage 1000 mg twice a day  #5 chronic diastolic and systolic heart failure -euvolemic echo ejection fraction 30 to 35% with grade 1 diastolic dysfunction on March 2021 On aldactone pta ?  He is also on Xarelto at home left ventricular thrombus  #6 CAD status post recent NSTEMI on aspirin Plavix  #7 cocaine and tobacco abuse-cessation discussed  #8 history of essential hypertension on Cozaar, metoprolol succinate, spironolactone prior to admission.  Blood pressure 104/58 continue to hold antihypertensives.    #9 hyperlipidemia on pravastatin at home  #10 diabetic neuropathy on Neurontin at home   Estimated body mass index is 33.73 kg/m as calculated from the following:   Height as of this encounter: 5\' 9"  (1.753 m).   Weight as of this encounter: 103.6 kg.  DVT prophylaxis: Heparin Code Status: Full code Family Communication: Discussed with patient Disposition Plan:Status is: Inpatient status post debridement of necrotizing fasciitis on IV antibiotics  Dispo: The patient is from: home  Anticipated d/c is to: snf  Anticipated d/c date [id number]  Patient currently is not medically stable to d/c.  Consultants:  surgery   Procedures: Left groin debridement 05/18/2019 Antimicrobials- Anti-infectives (From admission, onward)   Start     Dose/Rate Route Frequency Ordered Stop   05/20/19 1200  [MAR Hold]  linezolid (ZYVOX) IVPB 600 mg     (MAR Hold since Mon 05/20/2019 at 1228.Hold Reason: Transfer to a Procedural area.)   600 mg 300 mL/hr over 60 Minutes Intravenous Every 12 hours 05/20/19 1101     05/20/19 0500  vancomycin (VANCOCIN) IVPB 1000 mg/200 mL premix  Status:  Discontinued     1,000 mg 200 mL/hr over 60 Minutes Intravenous Every 12 hours 05/19/19 1533 05/20/19 1101   05/18/19 2000  [MAR Hold]  metroNIDAZOLE (FLAGYL) IVPB 500 mg     (MAR Hold since Mon 05/20/2019 at 1228.Hold Reason: Transfer to a Procedural area.)   500 mg 100 mL/hr over 60 Minutes Intravenous Every 8 hours 05/18/19 1913     05/18/19 1200  [MAR Hold]  ceFEPIme (MAXIPIME) 2 g in sodium chloride 0.9 % 100 mL IVPB     (MAR Hold since Mon 05/20/2019 at 1228.Hold Reason: Transfer to a Procedural area.)   2 g 200 mL/hr over 30 Minutes Intravenous Every 8 hours 05/18/19 1013     05/15/19 1500  vancomycin (VANCOREADY) IVPB 1250 mg/250 mL  Status:  Discontinued     1,250 mg 166.7 mL/hr over 90 Minutes Intravenous Every 24 hours 05/14/19 1447 05/19/19 1533   05/15/19 0000  ceFEPIme (MAXIPIME) 2 g in sodium chloride 0.9 % 100 mL IVPB  Status:  Discontinued     2 g 200 mL/hr over 30 Minutes Intravenous Every 12  hours 05/14/19 1424 05/18/19 1013   05/14/19 1300  vancomycin (VANCOREADY) IVPB 2000 mg/400 mL     2,000 mg 200 mL/hr over 120 Minutes Intravenous  Once 05/14/19 1223 05/14/19 1506   05/14/19 1200  ceFEPIme (MAXIPIME) 2 g in sodium chloride 0.9 % 100 mL IVPB     2 g 200 mL/hr over 30 Minutes Intravenous  Once 05/14/19 1158 05/14/19 1258   05/14/19 1200  vancomycin (VANCOCIN) IVPB 1000 mg/200 mL premix  Status:  Discontinued     1,000 mg 200 mL/hr over 60 Minutes Intravenous  Once 05/14/19 1158 05/14/19 1223        Subjective: Complains of pain in the groin 15 out of 10 asking for pain medicine  Objective: Vitals:   05/20/19 0430 05/20/19 1417 05/20/19 1432 05/20/19 1447  BP: 103/68 124/74 104/79 (!) 95/57  Pulse: 78 76 82 73  Resp: 17 11 16 15   Temp: 98 F (36.7 C) 98 F (36.7 C)  97.8 F (36.6 C)  TempSrc:      SpO2: 95% 98% 94% 99%  Weight:      Height:        Intake/Output Summary (Last 24 hours) at 05/20/2019 1516 Last data filed at 05/20/2019 1406 Gross per 24 hour  Intake 800 ml  Output 1350 ml  Net -550 ml   Filed Weights   05/17/19 0412 05/18/19 0419 05/18/19 0600  Weight: 102.6 kg 103.5 kg 103.6 kg    Examination:  General exam: Appears calm and comfortable  Respiratory system: Clear to auscultation. Respiratory effort normal. Cardiovascular system: S1 & S2 heard, RRR. No JVD, murmurs, rubs, gallops or clicks. No pedal edema. Gastrointestinal system: Abdomen is nondistended, soft and nontender. No organomegaly or masses felt. Normal bowel sounds heard. Central nervous system: Alert and oriented. No focal neurological deficits. Extremities: Symmetric 5 x 5 power.  Left groin covered with dressings Skin: No rashes, lesions or ulcers Psychiatry: Judgement and insight appear normal. Mood & affect appropriate.     Data Reviewed: I have personally reviewed following labs and imaging studies  CBC: Recent Labs  Lab 05/14/19 1204 05/14/19 1204 05/15/19 0505 05/16/19 0410 05/18/19 0448 05/19/19 0629 05/20/19 0630  WBC 21.7*   < > 17.1* 15.1* 19.1* 23.6* 26.8*  NEUTROABS 18.5*  --   --   --   --   --   --   HGB 13.5   < > 12.9* 12.4* 13.3 11.7* 9.9*  HCT 44.2   < > 41.7 39.3 41.2 36.5* 30.8*  MCV 85.7   < > 84.4 82.9 79.7* 80.6 80.8  PLT 290   < > 253 255 285 273 267   < > = values in this interval not displayed.   Basic Metabolic Panel: Recent Labs  Lab 05/16/19 0410 05/17/19 0407 05/18/19 0448 05/19/19 0629 05/20/19 0630  NA 136 137 134* 134* 136   K 4.0 3.8 3.5 4.0 3.2*  CL 106 104 101 101 106  CO2 20* 21* 23 20* 23  GLUCOSE 252* 153* 179* 143* 145*  BUN 24* 20 23 29* 22  CREATININE 1.74* 1.49* 1.21 1.33* 1.31*  CALCIUM 7.7* 8.0* 7.6* 7.3* 6.8*  MG  --   --  2.1  --  2.0   GFR: Estimated Creatinine Clearance: 67.6 mL/min (A) (by C-G formula based on SCr of 1.31 mg/dL (H)). Liver Function Tests: Recent Labs  Lab 05/14/19 1204 05/16/19 0410 05/19/19 0629 05/20/19 0630  AST 24 27 21  13*  ALT 19  21 17 14   ALKPHOS 77 72 59 57  BILITOT 0.9 1.0 0.7 0.6  PROT 7.6 6.5 5.5* 5.3*  ALBUMIN 3.0* 2.2* 1.5* 1.3*   No results for input(s): LIPASE, AMYLASE in the last 168 hours. No results for input(s): AMMONIA in the last 168 hours. Coagulation Profile: Recent Labs  Lab 05/14/19 1242  INR 2.0*   Cardiac Enzymes: No results for input(s): CKTOTAL, CKMB, CKMBINDEX, TROPONINI in the last 168 hours. BNP (last 3 results) No results for input(s): PROBNP in the last 8760 hours. HbA1C: No results for input(s): HGBA1C in the last 72 hours. CBG: Recent Labs  Lab 05/19/19 1603 05/19/19 2052 05/20/19 0827 05/20/19 1159 05/20/19 1419  GLUCAP 218* 238* 167* 132* 151*   Lipid Profile: No results for input(s): CHOL, HDL, LDLCALC, TRIG, CHOLHDL, LDLDIRECT in the last 72 hours. Thyroid Function Tests: No results for input(s): TSH, T4TOTAL, FREET4, T3FREE, THYROIDAB in the last 72 hours. Anemia Panel: No results for input(s): VITAMINB12, FOLATE, FERRITIN, TIBC, IRON, RETICCTPCT in the last 72 hours. Sepsis Labs: Recent Labs  Lab 05/14/19 1204 05/14/19 1416 05/14/19 1753  LATICACIDVEN 6.0* 4.3* 1.8    Recent Results (from the past 240 hour(s))  Urine culture     Status: None   Collection Time: 05/14/19 11:57 AM   Specimen: In/Out Cath Urine  Result Value Ref Range Status   Specimen Description   Final    IN/OUT CATH URINE Performed at [facility], [address] [place], [place] [address]    Special Requests   Final     NONE Performed at [facility], [address], [place], [place] [address]    Culture   Final    NO GROWTH Performed at [facility], [address], [place], [place] [address]    Report Status 05/15/2019 FINAL  Final  Respiratory Panel by RT PCR (Flu A&B, Covid) - Nasopharyngeal Swab     Status: None   Collection Time: 05/14/19 12:00 PM   Specimen: Nasopharyngeal Swab  Result Value Ref Range Status   SARS Coronavirus 2 by RT PCR NEGATIVE NEGATIVE Final    Comment: (NOTE) SARS-CoV-2 target nucleic acids are NOT DETECTED. The SARS-CoV-2 RNA is generally detectable in upper respiratoy specimens during the acute phase of infection. The lowest concentration of SARS-CoV-2 viral copies this assay can detect is 131 copies/mL. A negative result does not preclude SARS-Cov-2 infection and should not be used as the sole basis for treatment or other patient management decisions. A negative result may occur with  improper specimen collection/handling, submission of specimen other than nasopharyngeal swab, presence of viral mutation(s) within the areas targeted by this assay, and inadequate number of viral copies (<131 copies/mL). A negative result must be combined with clinical observations, patient history, and epidemiological information. The expected result is Negative. Fact Sheet for Patients:  PinkCheek.be Fact Sheet for Healthcare Providers:  GravelBags.it This test is not yet ap proved or cleared by the [place] FDA and  has been authorized for detection and/or diagnosis of SARS-CoV-2 by FDA under an Emergency Use Authorization (EUA). This EUA will remain  in effect (meaning this test can be used) for the duration of the COVID-19 declaration under Section 564(b)(1) of the Act, 21 U.S.C. section 360bbb-3(b)(1), unless the authorization is terminated or revoked sooner.    Influenza A by PCR NEGATIVE NEGATIVE Final    Influenza B by PCR NEGATIVE NEGATIVE Final    Comment: (NOTE) The Xpert Xpress SARS-CoV-2/FLU/RSV assay is intended as  an aid in  the diagnosis of influenza from Nasopharyngeal swab specimens and  should not be used as a sole basis for treatment. Nasal washings and  aspirates are unacceptable for Xpert Xpress SARS-CoV-2/FLU/RSV  testing. Fact Sheet for Patients: PinkCheek.be Fact Sheet for Healthcare Providers: GravelBags.it This test is not yet approved or cleared by the [place] FDA and  has been authorized for detection and/or diagnosis of SARS-CoV-2 by  FDA under an Emergency Use Authorization (EUA). This EUA will remain  in effect (meaning this test can be used) for the duration of the  Covid-19 declaration under Section 564(b)(1) of the Act, 21  U.S.C. section 360bbb-3(b)(1), unless the authorization is  terminated or revoked. Performed at [facility], [address], [place], [place] [address]   Blood Culture (routine x 2)     Status: None   Collection Time: 05/14/19 12:12 PM   Specimen: BLOOD  Result Value Ref Range Status   Specimen Description BLOOD DRAWN BY IV THERAPY  Final   Special Requests   Final    BOTTLES DRAWN AEROBIC AND ANAEROBIC Blood Culture adequate volume   Culture   Final    NO GROWTH 5 DAYS Performed at [facility], [address], [place], [place] [address]    Report Status 05/19/2019 FINAL  Final  Blood Culture (routine x 2)     Status: None   Collection Time: 05/14/19 12:20 PM   Specimen: BLOOD RIGHT HAND  Result Value Ref Range Status   Specimen Description BLOOD RIGHT HAND  Final   Special Requests   Final    BOTTLES DRAWN AEROBIC AND ANAEROBIC Blood Culture results may not be optimal due to an inadequate volume of blood received in culture bottles   Culture   Final    NO GROWTH 5 DAYS Performed at [facility], [address] [place], [place] [address]    Report Status 05/19/2019  FINAL  Final  MRSA PCR Screening     Status: None   Collection Time: 05/14/19 12:40 PM   Specimen: Nasal Mucosa; Nasopharyngeal  Result Value Ref Range Status   MRSA by PCR NEGATIVE NEGATIVE Final    Comment:        The GeneXpert MRSA Assay (FDA approved for NASAL specimens only), is one component of a comprehensive MRSA colonization surveillance program. It is not intended to diagnose MRSA infection nor to guide or monitor treatment for MRSA infections. Performed at [facility], [address], [place], [place] [address]   Culture, blood (Routine X 2) w Reflex to ID Panel     Status: None   Collection Time: 05/15/19  5:11 PM   Specimen: BLOOD RIGHT WRIST  Result Value Ref Range Status   Specimen Description BLOOD RIGHT WRIST  Final   Special Requests   Final    BOTTLES DRAWN AEROBIC AND ANAEROBIC Blood Culture adequate volume   Culture   Final    NO GROWTH 5 DAYS Performed at [facility], [address] [place], [place] [address]    Report Status 05/20/2019 FINAL  Final  Culture, blood (Routine X 2) w Reflex to ID Panel     Status: None   Collection Time: 05/15/19  5:11 PM   Specimen: BLOOD RIGHT ARM  Result Value Ref Range Status   Specimen Description BLOOD RIGHT ARM  Final   Special Requests   Final    BOTTLES DRAWN AEROBIC AND ANAEROBIC Blood Culture adequate volume   Culture   Final  NO GROWTH 5 DAYS Performed at [facility], [address], [place], [place] [address]    Report Status 05/20/2019 FINAL  Final  Aerobic/Anaerobic Culture (surgical/deep wound)     Status: None (Preliminary result)   Collection Time: 05/19/19  1:15 AM   Specimen: PATH Other; Abscess  Result Value Ref Range Status   Specimen Description ABSCESS LEFT GROIN  Final   Special Requests NONE  Final   Gram Stain   Final    FEW WBC PRESENT, PREDOMINANTLY PMN ABUNDANT GRAM NEGATIVE RODS FEW GRAM POSITIVE COCCI FEW GRAM POSITIVE RODS    Culture   Final    CULTURE REINCUBATED FOR BETTER  GROWTH Performed at [facility], [place] [address], [place], [place] [address]    Report Status PENDING  Incomplete         Radiology Studies: CT ABDOMEN PELVIS WO CONTRAST  Result Date: 05/18/2019 CLINICAL DATA:  Lower abdominal pain. Sepsis. Left groin cellulitis. EXAM: CT CHEST, ABDOMEN AND PELVIS WITHOUT CONTRAST TECHNIQUE: Multidetector CT imaging of the chest, abdomen and pelvis was performed following the standard protocol without IV contrast. COMPARISON:  Pelvis CT on 05/15/2019, and AP CT on 05/14/2019 FINDINGS: CT CHEST FINDINGS Cardiovascular: No acute findings. Aortic and coronary artery atherosclerosis noted. Mediastinum/Lymph Nodes: No masses or pathologically enlarged lymph nodes identified on this unenhanced exam. Lungs/Pleura: Mild scarring is seen predominately involving the right upper lobe. No evidence of pulmonary infiltrate or pleural effusion. A 5 mm pulmonary nodule is seen in the right lung apex on image 23/3. Musculoskeletal: No suspicious bone lesions identified. CT ABDOMEN AND PELVIS FINDINGS Hepatobiliary: No masses visualized on this unenhanced exam. Gallbladder is unremarkable. No evidence of biliary ductal dilatation. Pancreas: No mass or inflammatory changes identified on this unenhanced exam. Spleen: Within normal limits in size. Adrenals/Urinary Tract: No evidence of urolithiasis or hydronephrosis. Unremarkable appearance of bladder. Stomach/Bowel: No evidence of obstruction, inflammatory process, or abnormal fluid collections. Vascular/Lymphatic: No pathologically enlarged lymph nodes identified. No abdominal aortic aneurysm. Aortic atherosclerosis noted. Reproductive:  No masses or other significant abnormality. Other:  None. Musculoskeletal: No suspicious bone lesions identified. Increased edema and new subcutaneous emphysema is seen throughout the subcutaneous tissues of the left hip and proximal thigh compared to recent exams. This is highly suspicious for  necrotizing fasciitis. IMPRESSION: 1. Increased edema and new emphysema throughout the subcutaneous tissues of left hip and proximal thigh, highly suspicious for necrotizing fasciitis. 2. 5 mm indeterminate right upper lobe pulmonary nodule. No follow-up needed if patient is low-risk. Non-contrast chest CT can be considered in 12 months if patient is high-risk. This recommendation follows the consensus statement: Guidelines for Management of Incidental Pulmonary Nodules Detected on CT Images: From the Fleischner Society 2017; Radiology 2017; 284:228-243. Critical Value/emergent results were called by telephone at the time of interpretation on 05/18/2019 at 6:39 pm to provider DAVID TAT , who verbally acknowledged these results. Aortic Atherosclerosis (ICD10-I70.0). Electronically Signed   By: Marlaine Hind M.D.   On: 05/18/2019 18:52   CT CHEST WO CONTRAST  Result Date: 05/18/2019 CLINICAL DATA:  Lower abdominal pain. Sepsis. Left groin cellulitis. EXAM: CT CHEST, ABDOMEN AND PELVIS WITHOUT CONTRAST TECHNIQUE: Multidetector CT imaging of the chest, abdomen and pelvis was performed following the standard protocol without IV contrast. COMPARISON:  Pelvis CT on 05/15/2019, and AP CT on 05/14/2019 FINDINGS: CT CHEST FINDINGS Cardiovascular: No acute findings. Aortic and coronary artery atherosclerosis noted. Mediastinum/Lymph Nodes: No masses or pathologically enlarged lymph nodes identified on this  unenhanced exam. Lungs/Pleura: Mild scarring is seen predominately involving the right upper lobe. No evidence of pulmonary infiltrate or pleural effusion. A 5 mm pulmonary nodule is seen in the right lung apex on image 23/3. Musculoskeletal: No suspicious bone lesions identified. CT ABDOMEN AND PELVIS FINDINGS Hepatobiliary: No masses visualized on this unenhanced exam. Gallbladder is unremarkable. No evidence of biliary ductal dilatation. Pancreas: No mass or inflammatory changes identified on this unenhanced exam. Spleen:  Within normal limits in size. Adrenals/Urinary Tract: No evidence of urolithiasis or hydronephrosis. Unremarkable appearance of bladder. Stomach/Bowel: No evidence of obstruction, inflammatory process, or abnormal fluid collections. Vascular/Lymphatic: No pathologically enlarged lymph nodes identified. No abdominal aortic aneurysm. Aortic atherosclerosis noted. Reproductive:  No masses or other significant abnormality. Other:  None. Musculoskeletal: No suspicious bone lesions identified. Increased edema and new subcutaneous emphysema is seen throughout the subcutaneous tissues of the left hip and proximal thigh compared to recent exams. This is highly suspicious for necrotizing fasciitis. IMPRESSION: 1. Increased edema and new emphysema throughout the subcutaneous tissues of left hip and proximal thigh, highly suspicious for necrotizing fasciitis. 2. 5 mm indeterminate right upper lobe pulmonary nodule. No follow-up needed if patient is low-risk. Non-contrast chest CT can be considered in 12 months if patient is high-risk. This recommendation follows the consensus statement: Guidelines for Management of Incidental Pulmonary Nodules Detected on CT Images: From the Fleischner Society 2017; Radiology 2017; 284:228-243. Critical Value/emergent results were called by telephone at the time of interpretation on 05/18/2019 at 6:39 pm to provider DAVID TAT , who verbally acknowledged these results. Aortic Atherosclerosis (ICD10-I70.0). Electronically Signed   By: Marlaine Hind M.D.   On: 05/18/2019 18:52        Scheduled Meds: . [MAR Hold] aspirin  81 mg Oral Daily  . [MAR Hold] Chlorhexidine Gluconate Cloth  6 each Topical Daily  . [MAR Hold] feeding supplement (GLUCERNA SHAKE)  237 mL Oral TID BM  . fentaNYL      . [MAR Hold] heparin  5,000 Units Subcutaneous Q8H  . HYDROmorphone      . [MAR Hold] insulin aspart  0-15 Units Subcutaneous TID WC  . [MAR Hold] insulin aspart  0-5 Units Subcutaneous QHS  . [MAR  Hold] insulin glargine  10 Units Subcutaneous Daily  . [MAR Hold] polyethylene glycol  17 g Oral Daily   Continuous Infusions: . [MAR Hold] sodium chloride 250 mL (05/18/19 1158)  . [MAR Hold] ceFEPime (MAXIPIME) IV 2 g (05/20/19 1203)  . [MAR Hold] linezolid (ZYVOX) IV    . [MAR Hold] metronidazole 500 mg (05/20/19 0553)     LOS: 6 days     Georgette Shell, MD 05/20/2019, 3:16 PM

## 2019-05-20 NOTE — Transfer of Care (Signed)
Immediate Anesthesia Transfer of Care Note  Patient: [name]  Procedure(s) Performed: DEBRIDEMENT GROIN (Left Groin)  Patient Location: PACU  Anesthesia Type:General  Level of Consciousness: awake, alert  and oriented  Airway & Oxygen Therapy: Patient Spontanous Breathing and Patient connected to nasal cannula oxygen  Post-op Assessment: Report given to RN, Post -op Vital signs reviewed and stable and Patient moving all extremities  Post vital signs: Reviewed and stable  Last Vitals:  Vitals Value Taken Time  BP 124/74 05/20/19 1417  Temp 36.7 C 05/20/19 1417  Pulse 84 05/20/19 1424  Resp 28 05/20/19 1424  SpO2 98 % 05/20/19 1424  Vitals shown include unvalidated device data.  Last Pain:  Vitals:   05/20/19 1417  TempSrc:   PainSc: Asleep      Patients Stated Pain Goal: 0 ([id number])  Complications: No apparent anesthesia complications

## 2019-05-20 NOTE — Progress Notes (Signed)
Physical Therapy Treatment Patient Details Name: [name] MRN: [id number] DOB: Jan 22, 1955 Today's Date: 05/20/2019    History of Present Illness Patient is a 64 y/o male admitted with AMS due to overdose of tramadol and found in bathtub.  Found to have necotizing soft tissue infection of L groin and transferred to [facility] from [place] on 05/19/19 underwent I&D of L groin and planned repeat I&D 5/10.  PMH positive for CAD s/p NSTEMI and stent 1 month ago, DM, systolic CHF, CKD 3, HTN, cocaine use, OSA, PVD, apical mural thrombus.    PT Comments    Patient seen for limited in bed mobility and exercise this session due to pain following dressing change and anticipating further debridement in the OR this pm.  Feel he remains most appropriate for SNF at this time and he reports the person he is living with he plans to "put out" when he returns.  He has limited insight thinking he should be able to care for himself when he returns home despite lack of mobility since admission.  Will continue efforts to encourage mobility and pre-medicate around sessions as able.    Follow Up Recommendations  SNF;Supervision/Assistance - 24 hour     Equipment Recommendations  None recommended by PT    Recommendations for Other Services       Precautions / Restrictions Precautions Precautions: Fall Restrictions Weight Bearing Restrictions: No    Mobility  Bed Mobility Overal bed mobility: Needs Assistance             General bed mobility comments: pulled himself up in bed using head board with S, declined further mobility to EOB or OOB due to pain and going for further debridement this pm.  Transfers                    Ambulation/Gait                 Stairs             Wheelchair Mobility    Modified Rankin (Stroke Patients Only)       Balance                                            Cognition Arousal/Alertness: Awake/alert Behavior During  Therapy: WFL for tasks assessed/performed Overall Cognitive Status: No family/caregiver present to determine baseline cognitive functioning Area of Impairment: Safety/judgement;Problem solving                         Safety/Judgement: Decreased awareness of deficits;Decreased awareness of safety   Problem Solving: Slow processing;Requires verbal cues General Comments: limited insight into deficits and impact on ability to care for himself      Exercises General Exercises - Lower Extremity Ankle Circles/Pumps: AROM;5 reps;Supine Quad Sets: AROM;5 reps;Supine;Left Hip ABduction/ADduction: Other (comment)(encouraged and demonstrated, but pt refused to perform due to pain)    General Comments        Pertinent Vitals/Pain Pain Assessment: Faces Faces Pain Scale: Hurts whole lot Pain Location: L groin (just had dressing change) Pain Descriptors / Indicators: Grimacing;Guarding Pain Intervention(s): Limited activity within patient's tolerance;Monitored during session    Home Living                      Prior Function  PT Goals (current goals can now be found in the care plan section) Progress towards PT goals: Not progressing toward goals - comment(due to pt refusal due to pain)    Frequency    Min 3X/week      PT Plan Current plan remains appropriate    Co-evaluation              AM-PAC PT "6 Clicks" Mobility   Outcome Measure  Help needed turning from your back to your side while in a flat bed without using bedrails?: A Lot Help needed moving from lying on your back to sitting on the side of a flat bed without using bedrails?: A Lot Help needed moving to and from a bed to a chair (including a wheelchair)?: Total Help needed standing up from a chair using your arms (e.g., wheelchair or bedside chair)?: Total Help needed to walk in hospital room?: Total Help needed climbing 3-5 steps with a railing? : Total 6 Click Score: 8     End of Session   Activity Tolerance: Patient limited by pain Patient left: in bed;with call bell/phone within reach;with bed alarm set   PT Visit Diagnosis: Pain;Difficulty in walking, not elsewhere classified (R26.2);Other abnormalities of gait and mobility (R26.89) Pain - Right/Left: Left Pain - part of body: Hip(groin)     Time: 0940-1000 PT Time Calculation (min) (ACUTE ONLY): 20 min  Charges:  $Therapeutic Exercise: 8-22 mins                     Magda Kiel, [place] [facility] [phone] 05/20/2019    Reginia Naas 05/20/2019, 10:11 AM

## 2019-05-21 ENCOUNTER — Encounter: Payer: Self-pay | Admitting: *Deleted

## 2019-05-21 LAB — CBC
HCT: 32.5 % — ABNORMAL LOW (ref 39.0–52.0)
Hemoglobin: 10.5 g/dL — ABNORMAL LOW (ref 13.0–17.0)
MCH: 26.3 pg (ref 26.0–34.0)
MCHC: 32.3 g/dL (ref 30.0–36.0)
MCV: 81.5 fL (ref 80.0–100.0)
Platelets: 337 10*3/uL (ref 150–400)
RBC: 3.99 MIL/uL — ABNORMAL LOW (ref 4.22–5.81)
RDW: 16.9 % — ABNORMAL HIGH (ref 11.5–15.5)
WBC: 29.1 10*3/uL — ABNORMAL HIGH (ref 4.0–10.5)
nRBC: 0.6 % — ABNORMAL HIGH (ref 0.0–0.2)

## 2019-05-21 LAB — COMPREHENSIVE METABOLIC PANEL
ALT: 11 U/L (ref 0–44)
AST: 15 U/L (ref 15–41)
Albumin: 1.5 g/dL — ABNORMAL LOW (ref 3.5–5.0)
Alkaline Phosphatase: 66 U/L (ref 38–126)
Anion gap: 9 (ref 5–15)
BUN: 18 mg/dL (ref 8–23)
CO2: 24 mmol/L (ref 22–32)
Calcium: 7.5 mg/dL — ABNORMAL LOW (ref 8.9–10.3)
Chloride: 103 mmol/L (ref 98–111)
Creatinine, Ser: 1.34 mg/dL — ABNORMAL HIGH (ref 0.61–1.24)
GFR calc Af Amer: >60 mL/min (ref >60)
GFR calc non Af Amer: 56 mL/min — ABNORMAL LOW (ref >60)
Glucose, Bld: 182 mg/dL — ABNORMAL HIGH (ref 70–99)
Potassium: 3.4 mmol/L — ABNORMAL LOW (ref 3.5–5.1)
Sodium: 136 mmol/L (ref 135–145)
Total Bilirubin: 0.7 mg/dL (ref 0.3–1.2)
Total Protein: 6.1 g/dL — ABNORMAL LOW (ref 6.5–8.1)

## 2019-05-21 LAB — GLUCOSE, CAPILLARY
Glucose-Capillary: 167 mg/dL — ABNORMAL HIGH (ref 70–99)
Glucose-Capillary: 174 mg/dL — ABNORMAL HIGH (ref 70–99)
Glucose-Capillary: 102 mg/dL — ABNORMAL HIGH (ref 70–99)
Glucose-Capillary: 123 mg/dL — ABNORMAL HIGH (ref 70–99)

## 2019-05-21 LAB — AEROBIC/ANAEROBIC CULTURE W GRAM STAIN (SURGICAL/DEEP WOUND)

## 2019-05-21 LAB — SURGICAL PATHOLOGY

## 2019-05-21 NOTE — NC FL2 (Signed)
[place] LEVEL OF CARE SCREENING TOOL     IDENTIFICATION  Patient Name: [name] Birthdate: 1955/12/09 Sex: male Admission Date (Current Location): 05/14/2019  [facility] and [place] Number:  [employer] and Address:  [facility] [place] [facility]. [facility] [place] [address] [place], [place] [address]      Provider Number: [id number]  Attending Physician Name and Address:  Georgette Shell, MD  Relative Name and Phone Number:       Current Level of Care: Hospital Recommended Level of Care: [place] Prior Approval Number:    Date Approved/Denied:   PASRR Number: [id number]  Discharge Plan: SNF    Current Diagnoses: Patient Active Problem List   Diagnosis Date Noted  . Cellulitis of left abdominal wall   . Opiate overdose ([place])   . Uncontrolled type 2 diabetes mellitus with hyperglycemia ([place]) 05/16/2019  . Elevated lactic acid level   . Acute renal failure superimposed on stage 3b chronic kidney disease ([place]) 05/15/2019  . Cocaine abuse ([place]) 05/15/2019  . Cellulitis of left groin   . Severe sepsis ([place]) 05/14/2019  . Unstable angina ([place]) 10/16/2018  . Apical mural thrombus 10/15/2018  . Claudication in peripheral vascular disease ([place]) 11/30/2017  . Peripheral arterial disease ([place]) 11/28/2017  . Chest pain 11/03/2016  . Constipation 11/03/2016  . CKD (chronic kidney disease), stage II 08/02/2016  . Cellulitis of right hand 08/02/2016  . Chronic pain 08/02/2016  . AKI (acute kidney injury) ([place]) 01/31/2016  . Lobar pneumonia ([place]) 01/31/2016  . Influenza A 01/31/2016  . Diarrhea 01/30/2016  . NSVT (nonsustained ventricular tachycardia) ([place]) 09/12/2015  . Precordial chest pain 09/10/2015  . Cocaine use 09/04/2015  . Near syncope 06/29/2015  . Essential hypertension 06/29/2015  . Status post coronary artery stent placement 06/29/2015  . Insulin dependent diabetes mellitus 06/29/2015  . History of MI (myocardial  infarction) 06/29/2015  . Chronic systolic CHF [id number]  . Musculoskeletal chest pain 05/27/2015  . OSA on CPAP 01/21/2015  . Rectal bleeding   . Stage 3 chronic renal impairment associated with type 2 diabetes mellitus ([place]) 09/09/2013  . 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 CAD (coronary artery disease) 09/09/2013  . Cardiomyopathy, ischemic-EF 40-45% by echo 09/07/13 09/09/2013  . S/P LAD DES June 2014 09/06/2013  . Tobacco use disorder 09/06/2013  . Hyperglycemia 08/22/2012  . NSTEMI (non-ST elevated myocardial infarction) ([place]) 07/07/2012  . Dyslipidemia 07/07/2012  . Hypertensive heart disease     Orientation RESPIRATION BLADDER Height & Weight     Self, Time, Situation, Place  O2(3L nasal canula) Continent Weight: 229 lb 8 oz (104.1 kg) Height:  5\' 9"  (175.3 cm)  BEHAVIORAL SYMPTOMS/MOOD NEUROLOGICAL BOWEL NUTRITION STATUS      Continent Diet(see DC summary)  AMBULATORY STATUS COMMUNICATION OF NEEDS Skin   Extensive Assist Verbally Surgical wounds, Other (Comment)(closed incision on groin with gauze and ABD pads; ulceration on groin wigh ABD pads)                       Personal Care Assistance Level of Assistance  Bathing, Dressing, Feeding Bathing Assistance: Limited assistance Feeding assistance: Independent Dressing Assistance: Limited assistance     Functional Limitations Info  Sight, Hearing, Speech Sight Info: Adequate Hearing Info: Adequate Speech Info: Adequate    SPECIAL CARE FACTORS FREQUENCY  OT (By licensed OT), PT (By licensed PT)     PT Frequency: 5x week OT Frequency:  5x week            Contractures Contractures Info: Not present    Additional Factors Info  Code Status, Allergies, Insulin Sliding Scale Code Status Info: Full Allergies Info: No Known Allergies   Insulin Sliding Scale Info: insulin aspart (novoLOG) injection 0-15 Units 3x daily with meals; insulin aspart (novoLOG) injection 0-5 Units daily at  bedtime; insulin glargine (LANTUS) injection 10 Units daily       Current Medications (05/21/2019):  This is the current hospital active medication list Current Facility-Administered Medications  Medication Dose Route Frequency Provider Last Rate Last Admin  . 0.9 %  sodium chloride infusion   Intravenous PRN Jillyn Ledger, PA-C 10 mL/hr at 05/18/19 1158 250 mL at 05/18/19 1158  . acetaminophen (TYLENOL) tablet 650 mg  650 mg Oral Q6H PRN Jillyn Ledger, PA-C   650 mg at 05/19/19 0411   Or  . acetaminophen (TYLENOL) suppository 650 mg  650 mg Rectal Q6H PRN Maczis, Barth Kirks, PA-C      . alum & mag hydroxide-simeth (MAALOX/MYLANTA) 200-200-20 MG/5ML suspension 30 mL  30 mL Oral Q6H PRN Jillyn Ledger, PA-C   30 mL at 05/17/19 0817  . aspirin chewable tablet 81 mg  81 mg Oral Daily Jillyn Ledger, PA-C   81 mg at 05/21/19 1037  . ceFEPIme (MAXIPIME) 2 g in sodium chloride 0.9 % 100 mL IVPB  2 g Intravenous Q8H Maczis, Barth Kirks, PA-C   Stopped at 05/21/19 0510  . Chlorhexidine Gluconate Cloth 2 % PADS 6 each  6 each Topical Daily Jillyn Ledger, PA-C   6 each at 05/21/19 1027  . feeding supplement (GLUCERNA SHAKE) (GLUCERNA SHAKE) liquid 237 mL  237 mL Oral TID BM Jillyn Ledger, PA-C   237 mL at 05/21/19 1038  . heparin injection 5,000 Units  5,000 Units Subcutaneous Q8H Jillyn Ledger, PA-C   5,000 Units at 05/21/19 0524  . insulin aspart (novoLOG) injection 0-15 Units  0-15 Units Subcutaneous TID WC Jillyn Ledger, PA-C   3 Units at 05/21/19 [id number]  . insulin aspart (novoLOG) injection 0-5 Units  0-5 Units Subcutaneous QHS Jillyn Ledger, PA-C   2 Units at 05/20/19 2132  . insulin glargine (LANTUS) injection 10 Units  10 Units Subcutaneous Daily Jillyn Ledger, PA-C   10 Units at 05/21/19 1038  . linezolid (ZYVOX) IVPB 600 mg  600 mg Intravenous Q12H Jillyn Ledger, PA-C 300 mL/hr at 05/21/19 1027 600 mg at 05/21/19 1027  . metroNIDAZOLE (FLAGYL) IVPB 500 mg  500  mg Intravenous Q8H Jillyn Ledger, PA-C   Stopped at 05/21/19 0435  . morphine 2 MG/ML injection 2-4 mg  2-4 mg Intravenous Q2H PRN Jillyn Ledger, PA-C   4 mg at 05/21/19 0801  . ondansetron (ZOFRAN) tablet 4 mg  4 mg Oral Q6H PRN Maczis, Barth Kirks, PA-C       Or  . ondansetron [facility]) injection 4 mg  4 mg Intravenous Q6H PRN Jillyn Ledger, PA-C   4 mg at 05/18/19 2110  . oxyCODONE (Oxy IR/ROXICODONE) immediate release tablet 5-10 mg  5-10 mg Oral Q6H PRN Jillyn Ledger, PA-C   10 mg at 05/21/19 [id number]  . polyethylene glycol (MIRALAX / GLYCOLAX) packet 17 g  17 g Oral Daily Jillyn Ledger, PA-C   17 g at 05/21/19 1038  . simethicone (MYLICON) chewable tablet 80 mg  80 mg Oral QID PRN Jillyn Ledger,  PA-C   80 mg at 05/18/19 2109     Discharge Medications: Please see discharge summary for a list of discharge medications.  Relevant Imaging Results:  Relevant Lab Results:   Additional Information SSN [place], [place]

## 2019-05-21 NOTE — Progress Notes (Signed)
1 Day Post-Op  Subjective: CC: Patient reports pain at site of debridement. For dressing change this morning.   Objective: Vital signs in last 24 hours: Temp:  [97.3 F (36.3 C)-98.5 F (36.9 C)] 97.7 F (36.5 C) (05/11 0630) Pulse Rate:  [70-83] 72 (05/11 0630) Resp:  [11-22] 15 (05/11 0630) BP: (95-124)/(57-80) 101/67 (05/11 0630) SpO2:  [89 %-100 %] 98 % (05/11 0630) Weight:  [104.1 kg] 104.1 kg (05/11 0500) Last BM Date: 05/18/19  Intake/Output from previous day: 05/10 0701 - 05/11 0700 In: 2134.1 [P.O.:834; I.V.:300; IV Piggyback:1000.1] Out: 1900 [Urine:1900] Intake/Output this shift: Total I/O In: 120 [P.O.:120] Out: -   PE: Gen: Awake and alert, NAD Lungs: Normal rate and effort Abd: Soft, ND, NT Skin: Chaperone present. See picture below. Left groin wound with beefy red tissue at base. Skin edges clean. No purulence or dish water like drainage. Some SS drainage on dressing. Repacked and dressed.       Lab Results:  Recent Labs    05/20/19 0630 05/21/19 0715  WBC 26.8* 29.1*  HGB 9.9* 10.5*  HCT 30.8* 32.5*  PLT 267 337   BMET Recent Labs    05/20/19 0630 05/21/19 0715  NA 136 136  K 3.2* 3.4*  CL 106 103  CO2 23 24  GLUCOSE 145* 182*  BUN 22 18  CREATININE 1.31* 1.34*  CALCIUM 6.8* 7.5*   PT/INR No results for input(s): LABPROT, INR in the last 72 hours. CMP     Component Value Date/Time   NA 136 05/21/2019 0715   NA 141 11/28/2017 1208   K 3.4 (L) 05/21/2019 0715   CL 103 05/21/2019 0715   CO2 24 05/21/2019 0715   GLUCOSE 182 (H) 05/21/2019 0715   BUN 18 05/21/2019 0715   BUN 18 11/28/2017 1208   CREATININE 1.34 (H) 05/21/2019 0715   CALCIUM 7.5 (L) 05/21/2019 0715   PROT 6.1 (L) 05/21/2019 0715   ALBUMIN 1.5 (L) 05/21/2019 0715   AST 15 05/21/2019 0715   ALT 11 05/21/2019 0715   ALKPHOS 66 05/21/2019 0715   BILITOT 0.7 05/21/2019 0715   GFRNONAA 56 (L) 05/21/2019 0715   GFRAA >60 05/21/2019 0715   Lipase      Component Value Date/Time   LIPASE 40 03/10/2019 2208       Studies/Results: No results found.  Anti-infectives: Anti-infectives (From admission, onward)   Start     Dose/Rate Route Frequency Ordered Stop   05/20/19 1200  linezolid (ZYVOX) IVPB 600 mg     600 mg 300 mL/hr over 60 Minutes Intravenous Every 12 hours 05/20/19 1101     05/20/19 0500  vancomycin (VANCOCIN) IVPB 1000 mg/200 mL premix  Status:  Discontinued     1,000 mg 200 mL/hr over 60 Minutes Intravenous Every 12 hours 05/19/19 1533 05/20/19 1101   05/18/19 2000  metroNIDAZOLE (FLAGYL) IVPB 500 mg     500 mg 100 mL/hr over 60 Minutes Intravenous Every 8 hours 05/18/19 1913     05/18/19 1200  ceFEPIme (MAXIPIME) 2 g in sodium chloride 0.9 % 100 mL IVPB     2 g 200 mL/hr over 30 Minutes Intravenous Every 8 hours 05/18/19 1013     05/15/19 1500  vancomycin (VANCOREADY) IVPB 1250 mg/250 mL  Status:  Discontinued     1,250 mg 166.7 mL/hr over 90 Minutes Intravenous Every 24 hours 05/14/19 1447 05/19/19 1533   05/15/19 0000  ceFEPIme (MAXIPIME) 2 g in sodium chloride 0.9 % 100  mL IVPB  Status:  Discontinued     2 g 200 mL/hr over 30 Minutes Intravenous Every 12 hours 05/14/19 1424 05/18/19 1013   05/14/19 1300  vancomycin (VANCOREADY) IVPB 2000 mg/400 mL     2,000 mg 200 mL/hr over 120 Minutes Intravenous  Once 05/14/19 1223 05/14/19 1506   05/14/19 1200  ceFEPIme (MAXIPIME) 2 g in sodium chloride 0.9 % 100 mL IVPB     2 g 200 mL/hr over 30 Minutes Intravenous  Once 05/14/19 1158 05/14/19 1258   05/14/19 1200  vancomycin (VANCOCIN) IVPB 1000 mg/200 mL premix  Status:  Discontinued     1,000 mg 200 mL/hr over 60 Minutes Intravenous  Once 05/14/19 1158 05/14/19 1223       Assessment/Plan OSA HTN PVD CKD w/ AKI - improved Hx COPD Hx CHF DM2 CAD w/ recent NSTEMI (hospitalization 2/28 - 3/2) - On plavix at home  Necrotizing soft tissue infection left groin S/p Debridement left groin 32 cm x 6 cm x 4 cm deep  including skin, subcutaneous fat, and fascia, Dr. [place] Silos, 5/9 - POD #2 S/p debridement of 15 x 10 cm skin, subcutaneous tissue with some fascial resection, Dr. Kieth Brightly, 5/10, POD #1 - BID WTD dressing changes - Cont abx. Await cx's - Original plan was to return to OR tomorrow AM. Patients wound appears healthy currently. Will discuss with MD plans and update recs - Mobilize - Pulm toilet   FEN - HH/CM diet VTE - SCDs, subq heparin ID -  Vanc 5/4 - 5/10. Cefepime 5/4 >>. Flagyl 5/8 >> Linezolid 5/10 >> WBC up at 29.1   LOS: 7 days    Jillyn Ledger , [facility] 05/21/2019, 8:36 AM Please see Amion for pager number during day hours 7:00am-4:30pm

## 2019-05-21 NOTE — Anesthesia Postprocedure Evaluation (Signed)
Anesthesia Post Note  Patient: HUBBARD CROSTON  Procedure(s) Performed: DEBRIDEMENT GROIN (Left Groin)     Patient location during evaluation: PACU Anesthesia Type: General Level of consciousness: awake and alert Pain management: pain level controlled Vital Signs Assessment: post-procedure vital signs reviewed and stable Respiratory status: spontaneous breathing, nonlabored ventilation, respiratory function stable and patient connected to nasal cannula oxygen Cardiovascular status: blood pressure returned to baseline and stable Postop Assessment: no apparent nausea or vomiting Anesthetic complications: no    Last Vitals:  Vitals:   05/21/19 0630 05/21/19 1421  BP: 101/67 103/64  Pulse: 72 86  Resp: 15 18  Temp: 36.5 C (!) 36.4 C  SpO2: 98% 94%    Last Pain:  Vitals:   05/21/19 1421  TempSrc: Oral  PainSc:                  Maddax Palinkas L Aloysuis Ribaudo

## 2019-05-21 NOTE — Progress Notes (Signed)
PROGRESS NOTE    [name]  [id number] DOB: [birth year] DOA: 05/14/2019 PCP: System, Pcp Not In    Brief Narrative: HPI by Dr. Denton Brick on 05/14/2019 [name] a 64 y.o.malewith medical history significant for CAD, DM,systolic CHF, CKD 3, hypertension, diabetes, cocaine use, OSA, peripheral vascular disease,apical mural thrombus. Patient was brought to the ED via EMSwithreports of overdose on tramadol-patient reported he had taken 3 doses of tramadol he was found in the bathtub,apparently he was soaking his whole left groin/perineal area. At that time he was awake and alert. Patient's blood pressure was soft/low, subsequently EMS satpatient up to get his vitals, there wasareport of seizure activity,patient was unresponsive and shaky/tremulous. He was given 5 mg of Versed, 500 mill bolus of fluids.  On my evaluation, patient awakens to touch and voice, but drifts back to sleep. He tells me he took just 2 doses of Ultram. Reports pain and swelling in his left groin.He denies difficulty breathing or cough, no abdominal pain, no pain with urination.  Recent hospitalization2/28 - 3/2/21under cardiology service for NSTEMI, troponin peaking at 575, requiring PCI of RCA/PDA,via cardiac cath3/1/21.On dual antiplatelets Plavix and aspirin. Echocardiogram also showed apical filling defect consistent with thrombus/early thrombus formation. Plan was to continue aspirin and Plavix for 30 days, to discontinue aspirin then and addXarelto and continue Plavix long-term.Echo showed EF of 30 to 35%, due to history of intermittent compliance, was felt not to be a candidate for ICD. Unless he could demonstrate compliance with medications and follow-up in the future. ED Course:On arrival to the ED, patient was unresponsive, he was given 0.4 of Narcan mental status significantly improved. Blood pressure dropped to 86/65 in the ED, improved after 3.5 L sepsis fluid bolus was  given in ED. Febrile to 102.6. Initial tachycardia 104. Lactic acid was significantly elevated at 6 >>4.3.Creatinine elevated at 2.95.Respiratory panel negative for COVID-19 and influenza.  Portable chest x-ray mild bilateral interstitial prominence, tiny bilateral pleural effusionsandmild CHF cannot be excluded. Abdominal/pelvic CT without contrast-extensive inflammatory changes in the left inguinal region extending into the anterior medial upper thigh compatible with acute cellulitis. No associated abscess is present. Also low-density left perirectal fluid collection measuring up to 25 mm. UAPending, blood and urine cultures ordered. IV Vanco and cefepime started. EDPtalked to Dr. Melvyn Novas, who felt patient was appropriate for hospitalist admission, can consultif additional support needed. Hospitalist to admit for severe sepsis.   05/19/2019 patient was transferred [facility] yesterday with CT of the abdomen and pelvis showed necrotizing fasciitis with worsening leukocytosis. Patient was seen by general surgery and had debridement of the left groin.  05/21/2019 patient seen in the room complaining of severe left groin pain.  Denies any nausea vomiting.  Assessment & Plan:   Active Problems:   Tobacco use disorder   Cardiomyopathy, ischemic-EF 40-45% by echo 09/07/13   Essential hypertension   Severe sepsis ([place])   Acute renal failure superimposed on stage 3b chronic kidney disease (HCC)   Cocaine abuse ([place])   Cellulitis of left groin   Uncontrolled type 2 diabetes mellitus with hyperglycemia (HCC)   Elevated lactic acid level   Cellulitis of left abdominal wall   Opiate overdose ([place])  #1 sepsis secondary to left groin necrotizing fasciitis/cellulitis-status post debridement on 05/18/2019.  He was taken again to the OR on 05/20/2019 for additional debridement.  Additional necrotic tissue with new collections in the left medial thigh and the left  proximal groin was found.  Vancomycin changed to Zyvox continue Flagyl and cefepime. Wound appears to be clean and healthy appearing tissue.  Wbc 29.1 from 26.8 from 23.6 from 19.1 Wound culture abundant gram-negative rods and few gram-positive cocci and few gram-positive rods Out of bed Amb[facility] PT consult  #2 AKI on CKD stage IIIb secondary to hypotension and sepsis-Baseline creatinine 1.4-1.6. On admission his creatinine was 2.95 down to 1.33  #3 acute hypoxic respiratory failure resolved likely secondary to COPD sepsis and hypoventilation  #4 type 2 diabetes with hyperglycemia uncontrolled A1c was 11.1 on February 2021 On Lantus 10 units with SSI CBG (last 3)  Recent Labs    05/20/19 2114 05/21/19 0738 05/21/19 1144  GLUCAP 209* 167* 174*    Prior to admission he was on Jardiance 12.5 mg daily with Lantus 30 units at bedtime Glucophage 1000 mg twice a day  #5 chronic diastolic and systolic heart failure-euvolemic echo ejection fraction 30 to 35% with grade 1 diastolic dysfunction on March 2021 On aldactone pta ?He is also on Xarelto at home left ventricular thrombus Will need to restart Xarelto and Plavix at some point when he is done with debridement   #6 CAD status post recent NSTEMI on aspirin.  Plavix on hold  #7 cocaine and tobacco abuse-cessation discussed  #8 history of essential hypertension on Cozaar, metoprolol succinate, spironolactone prior to admission.  Blood pressure still soft at 103/64.  Continue to hold antihypertensives he was taking at home.    #9 hyperlipidemia on pravastatin at home  #10 diabetic neuropathy on Neurontin at home  #11 hypoalbuminemia 1.4 consult dietary for nutrition requirements.   Estimated body mass index is 33.89 kg/m as calculated from the following:   Height as of this encounter: 5\' 9"  (1.753 m).   Weight as of this encounter: 104.1 kg.   DVT prophylaxis:Heparin Code Status:Full code Family  Communication:Discussed with patient Disposition Plan:Status is: Inpatientstatus post debridement of necrotizing fasciitis on IV antibiotics  Dispo: The patient is from:home Anticipated d/c is to:snf Anticipated d/c date [id number] Patient currentlyis not medically stable to d/c.  Consultants:surgery   Procedures:Left groin debridement 05/18/2019 Antimicrobials-  Subjective: Complains of pain in the left groin 10 out of 10  Objective: Vitals:   05/21/19 0247 05/21/19 0500 05/21/19 0630 05/21/19 1421  BP: 100/65  101/67 103/64  Pulse: 75  72 86  Resp: 16  15 18   Temp: 98.2 F (36.8 C)  97.7 F (36.5 C) (!) 97.5 F (36.4 C)  TempSrc: Oral  Oral Oral  SpO2: 99%  98% 94%  Weight:  104.1 kg    Height:        Intake/Output Summary (Last 24 hours) at 05/21/2019 1450 Last data filed at 05/21/2019 1130 Gross per 24 hour  Intake 1654.07 ml  Output 1500 ml  Net 154.07 ml   Filed Weights   05/18/19 0600 05/20/19 1606 05/21/19 0500  Weight: 103.6 kg 104.1 kg 104.1 kg    Examination:  General exam: Appears calm and comfortable  Respiratory system: Clear to auscultation. Respiratory effort normal. Cardiovascular system: S1 & S2 heard, RRR. No JVD, murmurs, rubs, gallops or clicks. No pedal edema. Gastrointestinal system: Abdomen is nondistended, soft and nontender. No organomegaly or masses felt. Normal bowel sounds heard. Central nervous system: Alert and oriented. No focal neurological deficits. Extremities: Symmetric 5 x 5 power.  Left groin covered with a dressing Skin: No rashes, lesions or ulcers Psychiatry: Judgement and insight appear normal. Mood & affect appropriate.     Data  Reviewed: I have personally reviewed following labs and imaging studies  CBC: Recent Labs  Lab 05/16/19 0410 05/18/19 0448 05/19/19 0629 05/20/19 0630 05/21/19 0715  WBC 15.1* 19.1* 23.6* 26.8* 29.1*  HGB 12.4* 13.3 11.7* 9.9* 10.5*   HCT 39.3 41.2 36.5* 30.8* 32.5*  MCV 82.9 79.7* 80.6 80.8 81.5  PLT 255 285 273 267 [id number]   Basic Metabolic Panel: Recent Labs  Lab 05/17/19 0407 05/18/19 0448 05/19/19 0629 05/20/19 0630 05/21/19 0715  NA 137 134* 134* 136 136  K 3.8 3.5 4.0 3.2* 3.4*  CL 104 101 101 106 103  CO2 21* 23 20* 23 24  GLUCOSE 153* 179* 143* 145* 182*  BUN 20 23 29* 22 18  CREATININE 1.49* 1.21 1.33* 1.31* 1.34*  CALCIUM 8.0* 7.6* 7.3* 6.8* 7.5*  MG  --  2.1  --  2.0  --    GFR: Estimated Creatinine Clearance: 66.2 mL/min (A) (by C-G formula based on SCr of 1.34 mg/dL (H)). Liver Function Tests: Recent Labs  Lab 05/16/19 0410 05/19/19 0629 05/20/19 0630 05/21/19 0715  AST 27 21 13* 15  ALT 21 17 14 11   ALKPHOS 72 59 57 66  BILITOT 1.0 0.7 0.6 0.7  PROT 6.5 5.5* 5.3* 6.1*  ALBUMIN 2.2* 1.5* 1.3* 1.5*   No results for input(s): LIPASE, AMYLASE in the last 168 hours. No results for input(s): AMMONIA in the last 168 hours. Coagulation Profile: No results for input(s): INR, PROTIME in the last 168 hours. Cardiac Enzymes: No results for input(s): CKTOTAL, CKMB, CKMBINDEX, TROPONINI in the last 168 hours. BNP (last 3 results) No results for input(s): PROBNP in the last 8760 hours. HbA1C: No results for input(s): HGBA1C in the last 72 hours. CBG: Recent Labs  Lab 05/20/19 1419 05/20/19 1730 05/20/19 2114 05/21/19 0738 05/21/19 1144  GLUCAP 151* 165* 209* 167* 174*   Lipid Profile: No results for input(s): CHOL, HDL, LDLCALC, TRIG, CHOLHDL, LDLDIRECT in the last 72 hours. Thyroid Function Tests: No results for input(s): TSH, T4TOTAL, FREET4, T3FREE, THYROIDAB in the last 72 hours. Anemia Panel: No results for input(s): VITAMINB12, FOLATE, FERRITIN, TIBC, IRON, RETICCTPCT in the last 72 hours. Sepsis Labs: Recent Labs  Lab 05/14/19 1753  LATICACIDVEN 1.8    Recent Results (from the past 240 hour(s))  Urine culture     Status: None   Collection Time: 05/14/19 11:57 AM    Specimen: In/Out Cath Urine  Result Value Ref Range Status   Specimen Description   Final    IN/OUT CATH URINE Performed at [facility], [address] [place], [place] [address]    Special Requests   Final    NONE Performed at [facility], [address], [place], [place] [address]    Culture   Final    NO GROWTH Performed at [facility], [place] [address], [place], [place] [address]    Report Status 05/15/2019 FINAL  Final  Respiratory Panel by RT PCR (Flu A&B, Covid) - Nasopharyngeal Swab     Status: None   Collection Time: 05/14/19 12:00 PM   Specimen: Nasopharyngeal Swab  Result Value Ref Range Status   SARS Coronavirus 2 by RT PCR NEGATIVE NEGATIVE Final    Comment: (NOTE) SARS-CoV-2 target nucleic acids are NOT DETECTED. The SARS-CoV-2 RNA is generally detectable in upper respiratoy specimens during the acute phase of infection. The lowest concentration of SARS-CoV-2 viral copies this assay can detect is 131 copies/mL. A negative result does not preclude SARS-Cov-2 infection and  should not be used as the sole basis for treatment or other patient management decisions. A negative result may occur with  improper specimen collection/handling, submission of specimen other than nasopharyngeal swab, presence of viral mutation(s) within the areas targeted by this assay, and inadequate number of viral copies (<131 copies/mL). A negative result must be combined with clinical observations, patient history, and epidemiological information. The expected result is Negative. Fact Sheet for Patients:  PinkCheek.be Fact Sheet for Healthcare Providers:  GravelBags.it This test is not yet ap proved or cleared by the [place] FDA and  has been authorized for detection and/or diagnosis of SARS-CoV-2 by FDA under an Emergency Use Authorization (EUA). This EUA will remain  in effect (meaning this test can be used) for the  duration of the COVID-19 declaration under Section 564(b)(1) of the Act, 21 U.S.C. section 360bbb-3(b)(1), unless the authorization is terminated or revoked sooner.    Influenza A by PCR NEGATIVE NEGATIVE Final   Influenza B by PCR NEGATIVE NEGATIVE Final    Comment: (NOTE) The Xpert Xpress SARS-CoV-2/FLU/RSV assay is intended as an aid in  the diagnosis of influenza from Nasopharyngeal swab specimens and  should not be used as a sole basis for treatment. Nasal washings and  aspirates are unacceptable for Xpert Xpress SARS-CoV-2/FLU/RSV  testing. Fact Sheet for Patients: PinkCheek.be Fact Sheet for Healthcare Providers: GravelBags.it This test is not yet approved or cleared by the [place] FDA and  has been authorized for detection and/or diagnosis of SARS-CoV-2 by  FDA under an Emergency Use Authorization (EUA). This EUA will remain  in effect (meaning this test can be used) for the duration of the  Covid-19 declaration under Section 564(b)(1) of the Act, 21  U.S.C. section 360bbb-3(b)(1), unless the authorization is  terminated or revoked. Performed at [facility], [address], [place], [place] [address]   Blood Culture (routine x 2)     Status: None   Collection Time: 05/14/19 12:12 PM   Specimen: BLOOD  Result Value Ref Range Status   Specimen Description BLOOD DRAWN BY IV THERAPY  Final   Special Requests   Final    BOTTLES DRAWN AEROBIC AND ANAEROBIC Blood Culture adequate volume   Culture   Final    NO GROWTH 5 DAYS Performed at [facility], [address], [place], [place] [address]    Report Status 05/19/2019 FINAL  Final  Blood Culture (routine x 2)     Status: None   Collection Time: 05/14/19 12:20 PM   Specimen: BLOOD RIGHT HAND  Result Value Ref Range Status   Specimen Description BLOOD RIGHT HAND  Final   Special Requests   Final    BOTTLES DRAWN AEROBIC AND ANAEROBIC Blood Culture results may  not be optimal due to an inadequate volume of blood received in culture bottles   Culture   Final    NO GROWTH 5 DAYS Performed at [facility], [address] [place], [place] [address]    Report Status 05/19/2019 FINAL  Final  MRSA PCR Screening     Status: None   Collection Time: 05/14/19 12:40 PM   Specimen: Nasal Mucosa; Nasopharyngeal  Result Value Ref Range Status   MRSA by PCR NEGATIVE NEGATIVE Final    Comment:        The GeneXpert MRSA Assay (FDA approved for NASAL specimens only), is one component of a comprehensive MRSA colonization surveillance program. It is not intended to diagnose MRSA infection nor to guide or monitor  treatment for MRSA infections. Performed at [facility], [address], [place], [place] [address]   Culture, blood (Routine X 2) w Reflex to ID Panel     Status: None   Collection Time: 05/15/19  5:11 PM   Specimen: BLOOD RIGHT WRIST  Result Value Ref Range Status   Specimen Description BLOOD RIGHT WRIST  Final   Special Requests   Final    BOTTLES DRAWN AEROBIC AND ANAEROBIC Blood Culture adequate volume   Culture   Final    NO GROWTH 5 DAYS Performed at [facility], [address] [place], [place] [address]    Report Status 05/20/2019 FINAL  Final  Culture, blood (Routine X 2) w Reflex to ID Panel     Status: None   Collection Time: 05/15/19  5:11 PM   Specimen: BLOOD RIGHT ARM  Result Value Ref Range Status   Specimen Description BLOOD RIGHT ARM  Final   Special Requests   Final    BOTTLES DRAWN AEROBIC AND ANAEROBIC Blood Culture adequate volume   Culture   Final    NO GROWTH 5 DAYS Performed at [facility], [address] [place], [place] [address]    Report Status 05/20/2019 FINAL  Final  Aerobic/Anaerobic Culture (surgical/deep wound)     Status: None   Collection Time: 05/19/19  1:15 AM   Specimen: PATH Other; Abscess  Result Value Ref Range Status   Specimen Description ABSCESS LEFT GROIN  Final   Special Requests NONE   Final   Gram Stain   Final    FEW WBC PRESENT, PREDOMINANTLY PMN ABUNDANT GRAM NEGATIVE RODS FEW GRAM POSITIVE COCCI FEW GRAM POSITIVE RODS Performed at [facility], [place] [address], [place], [place] [address]    Culture   Final    MIXED ANAEROBIC FLORA PRESENT.  CALL LAB IF FURTHER IID REQUIRED.   Report Status 05/21/2019 FINAL  Final         Radiology Studies: No results found.      Scheduled Meds: . aspirin  81 mg Oral Daily  . Chlorhexidine Gluconate Cloth  6 each Topical Daily  . feeding supplement (GLUCERNA SHAKE)  237 mL Oral TID BM  . heparin  5,000 Units Subcutaneous Q8H  . insulin aspart  0-15 Units Subcutaneous TID WC  . insulin aspart  0-5 Units Subcutaneous QHS  . insulin glargine  10 Units Subcutaneous Daily  . polyethylene glycol  17 g Oral Daily   Continuous Infusions: . sodium chloride 250 mL (05/18/19 1158)  . ceFEPime (MAXIPIME) IV 2 g (05/21/19 1301)  . linezolid (ZYVOX) IV 600 mg (05/21/19 1027)  . metronidazole 500 mg (05/21/19 1302)     LOS: 7 days     Georgette Shell, MD  05/21/2019, 2:50 PM

## 2019-05-22 DIAGNOSIS — M726 Necrotizing fasciitis: Secondary | ICD-10-CM

## 2019-05-22 LAB — COMPREHENSIVE METABOLIC PANEL
ALT: 10 U/L (ref 0–44)
AST: 16 U/L (ref 15–41)
Albumin: 1.3 g/dL — ABNORMAL LOW (ref 3.5–5.0)
Alkaline Phosphatase: 63 U/L (ref 38–126)
Anion gap: 7 (ref 5–15)
BUN: 16 mg/dL (ref 8–23)
CO2: 25 mmol/L (ref 22–32)
Calcium: 7.2 mg/dL — ABNORMAL LOW (ref 8.9–10.3)
Chloride: 102 mmol/L (ref 98–111)
Creatinine, Ser: 1.24 mg/dL (ref 0.61–1.24)
GFR calc Af Amer: >60 mL/min (ref >60)
GFR calc non Af Amer: >60 mL/min (ref >60)
Glucose, Bld: 179 mg/dL — ABNORMAL HIGH (ref 70–99)
Potassium: 3.6 mmol/L (ref 3.5–5.1)
Sodium: 134 mmol/L — ABNORMAL LOW (ref 135–145)
Total Bilirubin: 0.6 mg/dL (ref 0.3–1.2)
Total Protein: 6 g/dL — ABNORMAL LOW (ref 6.5–8.1)

## 2019-05-22 LAB — CBC
HCT: 29.3 % — ABNORMAL LOW (ref 39.0–52.0)
Hemoglobin: 9.5 g/dL — ABNORMAL LOW (ref 13.0–17.0)
MCH: 26 pg (ref 26.0–34.0)
MCHC: 32.4 g/dL (ref 30.0–36.0)
MCV: 80.3 fL (ref 80.0–100.0)
Platelets: 354 10*3/uL (ref 150–400)
RBC: 3.65 MIL/uL — ABNORMAL LOW (ref 4.22–5.81)
RDW: 16.9 % — ABNORMAL HIGH (ref 11.5–15.5)
WBC: 28 10*3/uL — ABNORMAL HIGH (ref 4.0–10.5)
nRBC: 0.6 % — ABNORMAL HIGH (ref 0.0–0.2)

## 2019-05-22 LAB — GLUCOSE, CAPILLARY
Glucose-Capillary: 174 mg/dL — ABNORMAL HIGH (ref 70–99)
Glucose-Capillary: 165 mg/dL — ABNORMAL HIGH (ref 70–99)
Glucose-Capillary: 145 mg/dL — ABNORMAL HIGH (ref 70–99)
Glucose-Capillary: 195 mg/dL — ABNORMAL HIGH (ref 70–99)

## 2019-05-22 MED ORDER — HEPARIN (PORCINE) 25000 UT/250ML-% IV SOLN
1700.0000 [IU]/h | INTRAVENOUS | Status: DC
  Administered 2019-05-22 – 2019-05-23 (×2): 1500 [IU]/h via INTRAVENOUS
  Administered 2019-05-24: 1700 [IU]/h via INTRAVENOUS
  Filled 2019-05-22 (×3): qty 250

## 2019-05-22 MED ORDER — ADULT MULTIVITAMIN W/MINERALS CH
1.0000 | ORAL_TABLET | Freq: Every day | ORAL | Status: DC
  Administered 2019-05-22 – 2019-05-27 (×6): 1 via ORAL
  Filled 2019-05-22 (×6): qty 1

## 2019-05-22 MED ORDER — PRO-STAT SUGAR FREE PO LIQD
30.0000 mL | Freq: Three times a day (TID) | ORAL | Status: DC
  Administered 2019-05-22 – 2019-05-27 (×14): 30 mL via ORAL
  Filled 2019-05-22 (×14): qty 30

## 2019-05-22 NOTE — Consult Note (Addendum)
   [facility] CM Inpatient Consult   05/22/2019  KAP MILTIMORE 12/05/1955 [id number]   Patient screened for high risk score for unplanned readmission score in [place] [facility].  Review of patient's medical record reveals patient is in network with [facility] cardiologist however, no primary care provider in the [place] [facility] noted.    Primary Care Provider: Frederick is noted 1:38 pm:   Review of PT notes patient is likely for SNF level of care.  Patient needs for primary care is with the [facility] at this time and will be managed by the [place] for transition of care from SNF.  For questions contact:   Natividad Brood, RN BSN [facility] Liaison  [phone] business mobile phone Toll free office [phone]  Fax number: [phone] [email or web address]

## 2019-05-22 NOTE — Progress Notes (Signed)
PROGRESS NOTE    [name]   [id number]  DOB: [birth year]  DOA: 05/14/2019 PCP: System, Pcp Not In   Brief Narrative:  [name] is a 64 y.o. male with medical history significant for CAD, DM, systolic CHF, CKD 3, hypertension, diabetes, cocaine use, OSA, peripheral vascular disease, apical mural thrombus. Patient was brought to the ED via EMS with reports of overdose on tramadol. Patient reported he had taken 3 doses of tramadol he was found in the bathtub, apparently he was soaking his whole left groin/perineal area.  Recent hospitalization 2/28 - 03/12/19 under cardiology service for NSTEMI, troponin peaking at 575, requiring PCI of RCA/PDA, via cardiac cath 03/11/19.  On dual antiplatelets Plavix and aspirin.  Echocardiogram also showed apical filling defect consistent with thrombus/early thrombus formation.  Plan was to continue aspirin and Plavix for 30 days, to discontinue aspirin then and add Xarelto and continue Plavix long-term. Echo showed EF of 30 to 35%, due to history of intermittent compliance, was felt not to be a candidate for ICD.   In ED > given 0.4 of Narcan mental status significantly improved.  Blood pressure dropped to 86/65 in the ED, improved after 3.5 L sepsis fluid bolus was given in ED.  Febrile to 102.6.  Initial tachycardia 104.  Lactic acid was significantly elevated at 6 >> 4.3.  Creatinine elevated at 2.95.   Abdominal/pelvic CT without contrast> extensive inflammatory changes in the left inguinal region extending into the anterior medial upper thigh compatible with acute cellulitis.  Admitted with severe sepsis, AKI and started on Vanc and Cefepime and IVF.  Subjective: States he feels "much better". Can't specify but has no complaints.     Assessment & Plan:   Principal Problem:   Necrotizing fasciitis, severe sepsis with AKI - 5/8- debridement - 5/10- repeat debridement - finalized wound culture> gram-negative rods and few gram-positive  cocci and few gram-positive rods  - currently on Zyvox, Flagyl and Cefepime- Gen surgery has spoken with [name] who recommends Augmentin when ready to transition to oral - WBC 28  Active Problems: AKI- CKD 3b- baseline CR ~ 1.5-1.6 - Cr 2.95 on admission - Cr now 1.24  Severe hypoalbuminemia - Alb ~ 1 now- due to above illness -c ont Prostat     Cardiomyopathy, ischemic  - last ECHO>  30 to 35%.  Grade 1 d CHF - s/p cath - PCI of PDA and distal RCA - currently no fluid overload - not on diuretics   LV thrombus - Apical filling defect noted on ECHO on 3/1  - on Xarelto at home which is on hold due to debridements and large wound in left thigh- in regards to resumption of anticoagulation, I spoke with [name] who spoke with [name]- OK to resume Xarelto- for now I will start with Heparin infusion    Cocaine abuse and tobacco abuse - cessation discussed    Uncontrolled type 2 diabetes mellitus with hyperglycemia - A1c 9.6 on 05/16/19  - home meds> Lantus, Jardiance, Metformin - cont Lantus and SSI     Opiate overdose  - Tramodol OD on admission reversed with Narcan   Time spent in minutes: 40 DVT prophylaxis: SCDs Code Status: Full code Family Communication:  Disposition Plan:  Status is: Inpatient  Remains inpatient appropriate because:active infection needing IV antibiotics   Dispo: The patient is from: Home              Anticipated d/c is to:  SNF              Anticipated d/c date is: > 3 days              Patient currently is not medically stable to d/c.  Consultants:   General surgery Procedures:    debridement x 2 Antimicrobials:  Anti-infectives (From admission, onward)   Start     Dose/Rate Route Frequency Ordered Stop   05/20/19 1200  linezolid (ZYVOX) IVPB 600 mg     600 mg 300 mL/hr over 60 Minutes Intravenous Every 12 hours 05/20/19 1101     05/20/19 0500  vancomycin (VANCOCIN) IVPB 1000 mg/200 mL premix  Status:  Discontinued     1,000  mg 200 mL/hr over 60 Minutes Intravenous Every 12 hours 05/19/19 1533 05/20/19 1101   05/18/19 2000  metroNIDAZOLE (FLAGYL) IVPB 500 mg     500 mg 100 mL/hr over 60 Minutes Intravenous Every 8 hours 05/18/19 1913     05/18/19 1200  ceFEPIme (MAXIPIME) 2 g in sodium chloride 0.9 % 100 mL IVPB     2 g 200 mL/hr over 30 Minutes Intravenous Every 8 hours 05/18/19 1013     05/15/19 1500  vancomycin (VANCOREADY) IVPB 1250 mg/250 mL  Status:  Discontinued     1,250 mg 166.7 mL/hr over 90 Minutes Intravenous Every 24 hours 05/14/19 1447 05/19/19 1533   05/15/19 0000  ceFEPIme (MAXIPIME) 2 g in sodium chloride 0.9 % 100 mL IVPB  Status:  Discontinued     2 g 200 mL/hr over 30 Minutes Intravenous Every 12 hours 05/14/19 1424 05/18/19 1013   05/14/19 1300  vancomycin (VANCOREADY) IVPB 2000 mg/400 mL     2,000 mg 200 mL/hr over 120 Minutes Intravenous  Once 05/14/19 1223 05/14/19 1506   05/14/19 1200  ceFEPIme (MAXIPIME) 2 g in sodium chloride 0.9 % 100 mL IVPB     2 g 200 mL/hr over 30 Minutes Intravenous  Once 05/14/19 1158 05/14/19 1258   05/14/19 1200  vancomycin (VANCOCIN) IVPB 1000 mg/200 mL premix  Status:  Discontinued     1,000 mg 200 mL/hr over 60 Minutes Intravenous  Once 05/14/19 1158 05/14/19 1223       Objective: Vitals:   05/21/19 2101 05/22/19 0500 05/22/19 0520 05/22/19 1428  BP: 103/65  100/60 103/61  Pulse: 85  73 74  Resp: 17  17 19   Temp: 98.3 F (36.8 C)  98.3 F (36.8 C) 98.6 F (37 C)  TempSrc: Oral  Oral   SpO2: 94%  100% 95%  Weight:  109 kg    Height:        Intake/Output Summary (Last 24 hours) at 05/22/2019 1823 Last data filed at 05/22/2019 0537 Gross per 24 hour  Intake 1467.65 ml  Output 650 ml  Net 817.65 ml   Filed Weights   05/20/19 1606 05/21/19 0500 05/22/19 0500  Weight: 104.1 kg 104.1 kg 109 kg    Examination: General exam: Appears comfortable  HEENT: PERRLA, oral mucosa moist, no sclera icterus or thrush Respiratory system: Clear to  auscultation. Respiratory effort normal. Cardiovascular system: S1 & S2 heard, RRR.   Gastrointestinal system: Abdomen soft, non-tender, nondistended. Normal bowel sounds. Central nervous system: Alert and oriented. No focal neurological deficits. Extremities: No cyanosis, clubbing or edema Skin:  - left leg dressing not opened Psychiatry:  Mood & affect appropriate.     Data Reviewed: I have personally reviewed following labs and imaging studies  CBC: Recent Labs  Lab 05/18/19 0448 05/19/19 0629 05/20/19 0630 05/21/19 0715 05/22/19 0224  WBC 19.1* 23.6* 26.8* 29.1* 28.0*  HGB 13.3 11.7* 9.9* 10.5* 9.5*  HCT 41.2 36.5* 30.8* 32.5* 29.3*  MCV 79.7* 80.6 80.8 81.5 80.3  PLT 285 273 267 337 [id number]   Basic Metabolic Panel: Recent Labs  Lab 05/18/19 0448 05/19/19 0629 05/20/19 0630 05/21/19 0715 05/22/19 0224  NA 134* 134* 136 136 134*  K 3.5 4.0 3.2* 3.4* 3.6  CL 101 101 106 103 102  CO2 23 20* 23 24 25   GLUCOSE 179* 143* 145* 182* 179*  BUN 23 29* 22 18 16   CREATININE 1.21 1.33* 1.31* 1.34* 1.24  CALCIUM 7.6* 7.3* 6.8* 7.5* 7.2*  MG 2.1  --  2.0  --   --    GFR: Estimated Creatinine Clearance: 73.2 mL/min (by C-G formula based on SCr of 1.24 mg/dL). Liver Function Tests: Recent Labs  Lab 05/16/19 0410 05/19/19 0629 05/20/19 0630 05/21/19 0715 05/22/19 0224  AST 27 21 13* 15 16  ALT 21 17 14 11 10   ALKPHOS 72 59 57 66 63  BILITOT 1.0 0.7 0.6 0.7 0.6  PROT 6.5 5.5* 5.3* 6.1* 6.0*  ALBUMIN 2.2* 1.5* 1.3* 1.5* 1.3*   No results for input(s): LIPASE, AMYLASE in the last 168 hours. No results for input(s): AMMONIA in the last 168 hours. Coagulation Profile: No results for input(s): INR, PROTIME in the last 168 hours. Cardiac Enzymes: No results for input(s): CKTOTAL, CKMB, CKMBINDEX, TROPONINI in the last 168 hours. BNP (last 3 results) No results for input(s): PROBNP in the last 8760 hours. HbA1C: No results for input(s): HGBA1C in the last 72  hours. CBG: Recent Labs  Lab 05/21/19 1704 05/21/19 2100 05/22/19 0756 05/22/19 1147 05/22/19 1637  GLUCAP 102* 123* 174* 165* 145*   Lipid Profile: No results for input(s): CHOL, HDL, LDLCALC, TRIG, CHOLHDL, LDLDIRECT in the last 72 hours. Thyroid Function Tests: No results for input(s): TSH, T4TOTAL, FREET4, T3FREE, THYROIDAB in the last 72 hours. Anemia Panel: No results for input(s): VITAMINB12, FOLATE, FERRITIN, TIBC, IRON, RETICCTPCT in the last 72 hours. Urine analysis:    Component Value Date/Time   COLORURINE YELLOW 05/14/2019 1157   APPEARANCEUR HAZY (A) 05/14/2019 1157   LABSPEC 1.013 05/14/2019 1157   PHURINE 5.0 05/14/2019 1157   GLUCOSEU >=500 (A) 05/14/2019 1157   HGBUR MODERATE (A) 05/14/2019 1157   BILIRUBINUR NEGATIVE 05/14/2019 1157   KETONESUR NEGATIVE 05/14/2019 1157   PROTEINUR 30 (A) 05/14/2019 1157   UROBILINOGEN 0.2 09/27/2014 2237   NITRITE NEGATIVE 05/14/2019 1157   LEUKOCYTESUR NEGATIVE 05/14/2019 1157   Sepsis Labs: @LABRCNTIP (procalcitonin:4,lacticidven:4) ) Recent Results (from the past 240 hour(s))  Urine culture     Status: None   Collection Time: 05/14/19 11:57 AM   Specimen: In/Out Cath Urine  Result Value Ref Range Status   Specimen Description   Final    IN/OUT CATH URINE Performed at [facility], [address], [place], [place] [address]    Special Requests   Final    NONE Performed at [facility], [address], [place], [place] [address]    Culture   Final    NO GROWTH Performed at [facility], [place] [address], [place], [place] [address]    Report Status 05/15/2019 FINAL  Final  Respiratory Panel by RT PCR (Flu A&B, Covid) - Nasopharyngeal Swab     Status: None   Collection Time: 05/14/19 12:00 PM   Specimen: Nasopharyngeal Swab  Result  Value Ref Range Status   SARS Coronavirus 2 by RT PCR NEGATIVE NEGATIVE Final    Comment: (NOTE) SARS-CoV-2 target nucleic acids are NOT DETECTED. The SARS-CoV-2 RNA is  generally detectable in upper respiratoy specimens during the acute phase of infection. The lowest concentration of SARS-CoV-2 viral copies this assay can detect is 131 copies/mL. A negative result does not preclude SARS-Cov-2 infection and should not be used as the sole basis for treatment or other patient management decisions. A negative result may occur with  improper specimen collection/handling, submission of specimen other than nasopharyngeal swab, presence of viral mutation(s) within the areas targeted by this assay, and inadequate number of viral copies (<131 copies/mL). A negative result must be combined with clinical observations, patient history, and epidemiological information. The expected result is Negative. Fact Sheet for Patients:  PinkCheek.be Fact Sheet for Healthcare Providers:  GravelBags.it This test is not yet ap proved or cleared by the [place] FDA and  has been authorized for detection and/or diagnosis of SARS-CoV-2 by FDA under an Emergency Use Authorization (EUA). This EUA will remain  in effect (meaning this test can be used) for the duration of the COVID-19 declaration under Section 564(b)(1) of the Act, 21 U.S.C. section 360bbb-3(b)(1), unless the authorization is terminated or revoked sooner.    Influenza A by PCR NEGATIVE NEGATIVE Final   Influenza B by PCR NEGATIVE NEGATIVE Final    Comment: (NOTE) The Xpert Xpress SARS-CoV-2/FLU/RSV assay is intended as an aid in  the diagnosis of influenza from Nasopharyngeal swab specimens and  should not be used as a sole basis for treatment. Nasal washings and  aspirates are unacceptable for Xpert Xpress SARS-CoV-2/FLU/RSV  testing. Fact Sheet for Patients: PinkCheek.be Fact Sheet for Healthcare Providers: GravelBags.it This test is not yet approved or cleared by the [place] FDA and   has been authorized for detection and/or diagnosis of SARS-CoV-2 by  FDA under an Emergency Use Authorization (EUA). This EUA will remain  in effect (meaning this test can be used) for the duration of the  Covid-19 declaration under Section 564(b)(1) of the Act, 21  U.S.C. section 360bbb-3(b)(1), unless the authorization is  terminated or revoked. Performed at [facility], [address], [place], [place] [address]   Blood Culture (routine x 2)     Status: None   Collection Time: 05/14/19 12:12 PM   Specimen: BLOOD  Result Value Ref Range Status   Specimen Description BLOOD DRAWN BY IV THERAPY  Final   Special Requests   Final    BOTTLES DRAWN AEROBIC AND ANAEROBIC Blood Culture adequate volume   Culture   Final    NO GROWTH 5 DAYS Performed at [facility], [address] [place], [place] [address]    Report Status 05/19/2019 FINAL  Final  Blood Culture (routine x 2)     Status: None   Collection Time: 05/14/19 12:20 PM   Specimen: BLOOD RIGHT HAND  Result Value Ref Range Status   Specimen Description BLOOD RIGHT HAND  Final   Special Requests   Final    BOTTLES DRAWN AEROBIC AND ANAEROBIC Blood Culture results may not be optimal due to an inadequate volume of blood received in culture bottles   Culture   Final    NO GROWTH 5 DAYS Performed at [facility], [address], [place], [place] [address]    Report Status 05/19/2019 FINAL  Final  MRSA PCR Screening     Status: None   Collection Time: 05/14/19  12:40 PM   Specimen: Nasal Mucosa; Nasopharyngeal  Result Value Ref Range Status   MRSA by PCR NEGATIVE NEGATIVE Final    Comment:        The GeneXpert MRSA Assay (FDA approved for NASAL specimens only), is one component of a comprehensive MRSA colonization surveillance program. It is not intended to diagnose MRSA infection nor to guide or monitor treatment for MRSA infections. Performed at [facility], [address] [place], [place] [address]   Culture, blood  (Routine X 2) w Reflex to ID Panel     Status: None   Collection Time: 05/15/19  5:11 PM   Specimen: BLOOD RIGHT WRIST  Result Value Ref Range Status   Specimen Description BLOOD RIGHT WRIST  Final   Special Requests   Final    BOTTLES DRAWN AEROBIC AND ANAEROBIC Blood Culture adequate volume   Culture   Final    NO GROWTH 5 DAYS Performed at [facility], [address] [place], [place] [address]    Report Status 05/20/2019 FINAL  Final  Culture, blood (Routine X 2) w Reflex to ID Panel     Status: None   Collection Time: 05/15/19  5:11 PM   Specimen: BLOOD RIGHT ARM  Result Value Ref Range Status   Specimen Description BLOOD RIGHT ARM  Final   Special Requests   Final    BOTTLES DRAWN AEROBIC AND ANAEROBIC Blood Culture adequate volume   Culture   Final    NO GROWTH 5 DAYS Performed at [facility], [address], [place], [place] [address]    Report Status 05/20/2019 FINAL  Final  Aerobic/Anaerobic Culture (surgical/deep wound)     Status: None   Collection Time: 05/19/19  1:15 AM   Specimen: PATH Other; Abscess  Result Value Ref Range Status   Specimen Description ABSCESS LEFT GROIN  Final   Special Requests NONE  Final   Gram Stain   Final    FEW WBC PRESENT, PREDOMINANTLY PMN ABUNDANT GRAM NEGATIVE RODS FEW GRAM POSITIVE COCCI FEW GRAM POSITIVE RODS Performed at [facility], [place] [address] [place], [place] [address]    Culture   Final    MIXED ANAEROBIC FLORA PRESENT.  CALL LAB IF FURTHER IID REQUIRED.   Report Status 05/21/2019 FINAL  Final         Radiology Studies: No results found.    Scheduled Meds: . aspirin  81 mg Oral Daily  . Chlorhexidine Gluconate Cloth  6 each Topical Daily  . feeding supplement (PRO-STAT SUGAR FREE 64)  30 mL Oral TID  . heparin  5,000 Units Subcutaneous Q8H  . insulin aspart  0-15 Units Subcutaneous TID WC  . insulin aspart  0-5 Units Subcutaneous QHS  . insulin glargine  10 Units Subcutaneous Daily  .  multivitamin with minerals  1 tablet Oral Daily  . polyethylene glycol  17 g Oral Daily   Continuous Infusions: . sodium chloride 250 mL (05/18/19 1158)  . ceFEPime (MAXIPIME) IV 2 g (05/22/19 1506)  . linezolid (ZYVOX) IV 600 mg (05/22/19 1153)  . metronidazole 500 mg (05/22/19 1345)     LOS: 8 days      Russell Odea, MD [facility] Pager: [email or web address] 05/22/2019, 6:23 PM

## 2019-05-22 NOTE — Progress Notes (Signed)
Physical Therapy Treatment Patient Details Name: [name] MRN: [id number] DOB: [birth year] Today's Date: 05/22/2019    History of Present Illness Patient is a 64 y/o male admitted with AMS due to overdose of tramadol and found in bathtub.  Found to have necotizing soft tissue infection of L groin and transferred to [facility] from [place] on 05/19/19 underwent I&D of L groin and planned repeat I&D 5/10.  PMH positive for CAD s/p NSTEMI and stent 1 month ago, DM, systolic CHF, CKD 3, HTN, cocaine use, OSA, PVD, apical mural thrombus.    PT Comments    Pt was seen for ROM to legs, strengthening, and declined to get OOB.  He is assisted to scoot on bed but is painful with all LE movement on LLE. Will work on improving the poor flexion of L knee, and will encourage him to sit up on a chair, to increase sitting time and to work on AROM on LLE after losing his freedom of movement from wound on L thigh.    Follow Up Recommendations  SNF;Supervision/Assistance - 24 hour     Equipment Recommendations  None recommended by PT    Recommendations for Other Services       Precautions / Restrictions Precautions Precautions: Fall Restrictions Weight Bearing Restrictions: No    Mobility  Bed Mobility Overal bed mobility: Needs Assistance Bed Mobility: Supine to Sit;Sit to Supine     Supine to sit: Max assist Sit to supine: Max assist   General bed mobility comments: partial movement due to pain from LLE necrotizing fasciitis  Transfers                 General transfer comment: declined  Ambulation/Gait             General Gait Details: declined   Marine scientist [place] (Stroke Patients Only)       Balance Overall balance assessment: Needs assistance                                          Cognition Arousal/Alertness: Awake/alert Behavior During Therapy: WFL for tasks assessed/performed Overall Cognitive  Status: No family/caregiver present to determine baseline cognitive functioning Area of Impairment: Problem solving;Awareness;Safety/judgement;Following commands;Attention                   Current Attention Level: Selective   Following Commands: Follows one step commands inconsistently;Follows one step commands with increased time Safety/Judgement: Decreased awareness of safety;Decreased awareness of deficits Awareness: Intellectual Problem Solving: Slow processing;Requires verbal cues        Exercises General Exercises - Lower Extremity Ankle Circles/Pumps: AROM;AAROM;5 reps Heel Slides: AAROM;10 reps Hip ABduction/ADduction: AAROM;10 reps Hip Flexion/Marching: AAROM;10 reps    General Comments General comments (skin integrity, edema, etc.): pt was unable to do more with bed mob, assisted to scooting up in bed      Pertinent Vitals/Pain Pain Assessment: 0-10 Pain Score: 8  Pain Location: L groin Pain Descriptors / Indicators: Guarding;Grimacing Pain Intervention(s): Limited activity within patient's tolerance;Monitored during session;Premedicated before session;Repositioned    Home Living                      Prior Function            PT Goals (current goals  can now be found in the care plan section) Acute Rehab PT Goals Patient Stated Goal: heal up LLE    Frequency    Min 3X/week      PT Plan Current plan remains appropriate    Co-evaluation              AM-PAC PT "6 Clicks" Mobility   Outcome Measure  Help needed turning from your back to your side while in a flat bed without using bedrails?: A Little Help needed moving from lying on your back to sitting on the side of a flat bed without using bedrails?: A Lot Help needed moving to and from a bed to a chair (including a wheelchair)?: A Lot Help needed standing up from a chair using your arms (e.g., wheelchair or bedside chair)?: A Lot Help needed to walk in hospital room?:  Total Help needed climbing 3-5 steps with a railing? : Total 6 Click Score: 11    End of Session   Activity Tolerance: Patient limited by pain Patient left: in bed;with call bell/phone within reach;with bed alarm set Nurse Communication: Mobility status PT Visit Diagnosis: Pain;Difficulty in walking, not elsewhere classified (R26.2);Other abnormalities of gait and mobility (R26.89) Pain - Right/Left: Left Pain - part of body: Hip     Time: [id number] PT Time Calculation (min) (ACUTE ONLY): 34 min  Charges:  $Therapeutic Exercise: 23-37 mins                 Ramond Dial 05/22/2019, 9:16 PM Mee Hives, PT MS Acute Rehab Dept. Number: [place] [facility] [place]

## 2019-05-22 NOTE — Progress Notes (Signed)
Initial Nutrition Assessment  DOCUMENTATION CODES:   Obesity unspecified  INTERVENTION:   -D/c Glucerna Shake po TID, each supplement provides 220 kcal and 10 grams of protein -30 ml Prostat TID, each supplement provides 100 kclas and 15 grams protein -MVI with minerals daily -Magic cup TID with meals, each supplement provides 290 kcal and 9 grams of protein  NUTRITION DIAGNOSIS:   Increased nutrient needs related to wound healing as evidenced by estimated needs.  GOAL:   Patient will meet greater than or equal to 90% of their needs  MONITOR:   PO intake, Supplement acceptance, Labs, Weight trends, Skin, I & O's  REASON FOR ASSESSMENT:   Consult Assessment of nutrition requirement/status, Poor PO, Wound healing  ASSESSMENT:   [name] is a 64 y.o. male with medical history significant for CAD, DM, systolic CHF, CKD 3, hypertension, diabetes, cocaine use, OSA, peripheral vascular disease, apical mural thrombus.Patient was brought to the ED via EMS with reports of overdose on tramadol-patient reported he had taken 3 doses of tramadol he was found in the bathtub, apparently he was soaking his whole left groin/perineal area.  At that time he was awake and alert.  Patient's blood pressure was soft/low, subsequently EMS sat patient up to get his vitals, there was a report of seizure activity, patient was unresponsive and shaky/tremulous.  He was given 5 mg of Versed, 500 mill bolus of fluids.  Pt admitted with severe sepsis, due to lt groin/ inguina cellulitis.   5/8- per general surgery, CT revealed extensive subcutaneous gas more concerning now for the necrotizing infection; transferred to [facility] 5/9- s/p Procedure(s): Debridement left groin 32 cm x 6 cm x 4 cm deep including skin, subcutaneous fat, and fascia 5/10- s/p Procedure: debridement of 15 x 10 cm skin, subcutaneous tissue with some fascial resection  Reviewed I/O's: +748 ml x 24 hours and +2.6 L since  admission  UOP: 1.7 L x 24 hours  Attempted to speak with pt via phone, however, no answer.   Reviewed meal intake; meal completion documented at 50-100%. Pt is refusing Glucerna supplements.   Reviewed wt hx; wt has been stable over the past year.   Medications reviewed and include miralax.   Lab Results  Component Value Date   HGBA1C 9.6 (H) 05/16/2019   PTA DM medications are 30 units insulin glargine daily and 100 mg metformin BID .   Labs reviewed: Na: 134, CBGS: 123-174 (inpatient orders for glycemic control are 0-15 units insulin aspart TID with meals, 0-5 units insulin aspart q HS, and 10 units insulin glargine daily).   Diet Order:   Diet Order            Diet heart healthy/carb modified Room service appropriate? Yes; Fluid consistency: Thin  Diet effective now              EDUCATION NEEDS:   No education needs have been identified at this time  Skin:  Skin Assessment: Skin Integrity Issues: Skin Integrity Issues:: Incisions Incisions: lt groin  Last BM:  05/18/19  Height:   Ht Readings from Last 1 Encounters:  05/20/19 5\' 9"  (1.753 m)    Weight:   Wt Readings from Last 1 Encounters:  05/22/19 109 kg    Ideal Body Weight:  72.7 kg  BMI:  Body mass index is 35.49 kg/m.  Estimated Nutritional Needs:   Kcal:  [id number]  Protein:  130-145 grams  Fluid:  > 2.3 L    Loistine Chance, RD,  LDN, [place] Registered Dietitian II Certified Diabetes Care and Education Specialist Please refer to [facility] for RD and/or RD on-call/weekend/after hours pager

## 2019-05-22 NOTE — Progress Notes (Addendum)
2 Days Post-Op  Subjective: CC: Patient for dressing change this morning.  Per notes from yesterday, discharge plan is to SNF.  He reports that if he did go home he has a significant other that can help him with dressing changes.  Objective: Vital signs in last 24 hours: Temp:  [97.5 F (36.4 C)-98.3 F (36.8 C)] 98.3 F (36.8 C) (05/12 0520) Pulse Rate:  [73-86] 73 (05/12 0520) Resp:  [17-18] 17 (05/12 0520) BP: (100-103)/(60-65) 100/60 (05/12 0520) SpO2:  [94 %-100 %] 100 % (05/12 0520) Weight:  [id number] kg] 109 kg (05/12 0500) Last BM Date: 05/18/19  Intake/Output from previous day: 05/11 0701 - 05/12 0700 In: 2397.7 [P.O.:1017; IV Piggyback:1380.7] Out: 1650 [Urine:1650] Intake/Output this shift: No intake/output data recorded.  PE: Gen: Awake and alert, NAD Lungs: Normal rate and effort Abd: Soft, ND, NT Skin: Chaperone present. See picture below. Left groin wound with beefy red tissue at base. Skin edges clean. No purulence or dish water like drainage. Some SS drainage on dressing. Repacked and dressed.      Lab Results:  Recent Labs    05/21/19 0715 05/22/19 0224  WBC 29.1* 28.0*  HGB 10.5* 9.5*  HCT 32.5* 29.3*  PLT 337 354   BMET Recent Labs    05/21/19 0715 05/22/19 0224  NA 136 134*  K 3.4* 3.6  CL 103 102  CO2 24 25  GLUCOSE 182* 179*  BUN 18 16  CREATININE 1.34* 1.24  CALCIUM 7.5* 7.2*   PT/INR No results for input(s): LABPROT, INR in the last 72 hours. CMP     Component Value Date/Time   NA 134 (L) 05/22/2019 0224   NA 141 11/28/2017 1208   K 3.6 05/22/2019 0224   CL 102 05/22/2019 0224   CO2 25 05/22/2019 0224   GLUCOSE 179 (H) 05/22/2019 0224   BUN 16 05/22/2019 0224   BUN 18 11/28/2017 1208   CREATININE 1.24 05/22/2019 0224   CALCIUM 7.2 (L) 05/22/2019 0224   PROT 6.0 (L) 05/22/2019 0224   ALBUMIN 1.3 (L) 05/22/2019 0224   AST 16 05/22/2019 0224   ALT 10 05/22/2019 0224   ALKPHOS 63 05/22/2019 0224   BILITOT 0.6  05/22/2019 0224   GFRNONAA >60 05/22/2019 0224   GFRAA >60 05/22/2019 0224   Lipase     Component Value Date/Time   LIPASE 40 03/10/2019 2208       Studies/Results: No results found.  Anti-infectives: Anti-infectives (From admission, onward)   Start     Dose/Rate Route Frequency Ordered Stop   05/20/19 1200  linezolid (ZYVOX) IVPB 600 mg     600 mg 300 mL/hr over 60 Minutes Intravenous Every 12 hours 05/20/19 1101     05/20/19 0500  vancomycin (VANCOCIN) IVPB 1000 mg/200 mL premix  Status:  Discontinued     1,000 mg 200 mL/hr over 60 Minutes Intravenous Every 12 hours 05/19/19 1533 05/20/19 1101   05/18/19 2000  metroNIDAZOLE (FLAGYL) IVPB 500 mg     500 mg 100 mL/hr over 60 Minutes Intravenous Every 8 hours 05/18/19 1913     05/18/19 1200  ceFEPIme (MAXIPIME) 2 g in sodium chloride 0.9 % 100 mL IVPB     2 g 200 mL/hr over 30 Minutes Intravenous Every 8 hours 05/18/19 1013     05/15/19 1500  vancomycin (VANCOREADY) IVPB 1250 mg/250 mL  Status:  Discontinued     1,250 mg 166.7 mL/hr over 90 Minutes Intravenous Every 24 hours 05/14/19 1447 05/19/19  1533   05/15/19 0000  ceFEPIme (MAXIPIME) 2 g in sodium chloride 0.9 % 100 mL IVPB  Status:  Discontinued     2 g 200 mL/hr over 30 Minutes Intravenous Every 12 hours 05/14/19 1424 05/18/19 1013   05/14/19 1300  vancomycin (VANCOREADY) IVPB 2000 mg/400 mL     2,000 mg 200 mL/hr over 120 Minutes Intravenous  Once 05/14/19 1223 05/14/19 1506   05/14/19 1200  ceFEPIme (MAXIPIME) 2 g in sodium chloride 0.9 % 100 mL IVPB     2 g 200 mL/hr over 30 Minutes Intravenous  Once 05/14/19 1158 05/14/19 1258   05/14/19 1200  vancomycin (VANCOCIN) IVPB 1000 mg/200 mL premix  Status:  Discontinued     1,000 mg 200 mL/hr over 60 Minutes Intravenous  Once 05/14/19 1158 05/14/19 1223       Assessment/Plan OSA HTN PVD Hx left ventricular thrombus per notes on Xarelto at home CKD w/ AKI - improved Hx COPD Hx CHF DM2 CAD w/ recent NSTEMI  (hospitalization 2/28 - 3/2) - On plavix at home  Necrotizing soft tissue infection left groin S/p Debridement left groin 32cm x 6 cm x 4 cm deep including skin, subcutaneous fat, and fascia, Dr. Grandville Silos, 5/9 - POD #3 S/p debridement of 15 x 10 cm skin, subcutaneous tissue with some fascial resection, Dr. Kieth Brightly, 5/10, POD #2 - BID WTD dressing changes - per notes it appears he is planning for SNF. If he was to go home, would need to have his significant other come in for dressing change teaching - Cont abx.  Cultures with abundant gram-negative rods, few gram-positive cocci, few gram-positive rods.  - Mobilize, PT - Pulm toilet   FEN - HH/CM diet VTE - SCDs, subq heparin, okay to restart home meds ID -  Vanc 5/4 - 5/10. Cefepime 5/4 >>. Flagyl 5/8 >> Linezolid 5/10 >> WBC down at 28. Discussed with ID, Dr. Tommy Medal, who recommends Augmentin when switched to Oral abx based on Cx report   LOS: 8 days    Jillyn Ledger , [facility] 05/22/2019, 8:19 AM Please see Amion for pager number during day hours 7:00am-4:30pm

## 2019-05-22 NOTE — Progress Notes (Addendum)
ANTICOAGULATION CONSULT NOTE - Initial Consult  Pharmacy Consult for heparin Indication: LV thrombus  No Known Allergies  Patient Measurements: Height: 5\' 9"  (175.3 cm) Weight: 109 kg (240 lb 4.8 oz) IBW/kg (Calculated) : 70.7 Heparin Dosing Weight: 93kg  Vital Signs: Temp: 98.6 F (37 C) (05/12 1428) BP: 103/61 (05/12 1428) Pulse Rate: 74 (05/12 1428)  Labs: Recent Labs    05/20/19 0630 05/20/19 0630 05/21/19 0715 05/22/19 0224  HGB 9.9*   < > 10.5* 9.5*  HCT 30.8*  --  32.5* 29.3*  PLT 267  --  337 354  CREATININE 1.31*  --  1.34* 1.24   < > = values in this interval not displayed.    Estimated Creatinine Clearance: 73.2 mL/min (by C-G formula based on SCr of 1.24 mg/dL).   Medical History: Past Medical History:  Diagnosis Date  . Bulging lumbar disc   . CAD (coronary artery disease) [phone]   a. prior LAD stenting. b. s/p DES to [facility] 08/2015 @ [facility]. c. 04/2016 Cardiac cath at [facility]. # vessel CAD, with patent stent in the PLAD and RCA. Diffuse dLAD, OM2, and  RPDA disease. No aortic stenosis. Elevated LVEDP. No lesion to suggest ACS. Continue medical managment.  . Chronic lower back pain   . CKD (chronic kidney disease), stage II   . Cocaine abuse ([place])   . DM2 (diabetes mellitus, type 2) ([place])   . GERD (gastroesophageal reflux disease)   . Headache    "often; not regular" (11/30/2017)  . HTN (hypertension)   . Hyperlipidemia   . Ischemic cardiomyopathy   . MI, old   . Obesity   . Pneumonia    hx  . Sleep apnea    "have machine; I don't use it" (11/30/2017)  . Tobacco use     Medications:  Medications Prior to Admission  Medication Sig Dispense Refill Last Dose  . aspirin EC 81 MG tablet Take 1 tablet (81 mg total) by mouth daily. 90 tablet 3   . clopidogrel (PLAVIX) 75 MG tablet Take 1 tablet (75 mg total) by mouth daily with breakfast. 90 tablet 3   . empagliflozin (JARDIANCE) 25 MG TABS tablet Take 12.5 mg by mouth daily.     [facility] Kitchen  gabapentin (NEURONTIN) 300 MG capsule Take 300 mg by mouth 3 (three) times daily.     . insulin glargine (LANTUS) 100 UNIT/ML injection Inject 30 Units into the skin at bedtime.      Marland Kitchen losartan (COZAAR) 25 MG tablet Take 0.5 tablets (12.5 mg total) by mouth daily. 90 tablet 3   . metFORMIN (GLUCOPHAGE) 1000 MG tablet Take 1 tablet (1,000 mg total) by mouth 2 (two) times daily with a meal. Do not restart Metformin until Wednesday am 7/2 (Patient taking differently: Take 1,000 mg by mouth 2 (two) times daily with a meal. )     . metoprolol succinate (TOPROL-XL) 25 MG 24 hr tablet Take 1 tablet (25 mg total) by mouth daily. 90 tablet 3   . nicotine (NICODERM CQ - DOSED IN MG/24 HOURS) 14 mg/24hr patch Place 14 mg onto the skin daily.     . nitroGLYCERIN (NITROSTAT) 0.4 MG SL tablet Place 1 tablet (0.4 mg total) under the tongue every 5 (five) minutes x 3 doses as needed for chest pain. 25 tablet 3   . pantoprazole (PROTONIX) 40 MG tablet Take 1 tablet (40 mg total) by mouth daily. 90 tablet 3   . polyethylene glycol (MIRALAX / GLYCOLAX) 17  g packet Take 17 g by mouth daily.     . pravastatin (PRAVACHOL) 80 MG tablet Take 1 tablet (80 mg total) by mouth daily at 6 PM. 90 tablet 3   . rivaroxaban (XARELTO) 20 MG TABS tablet Take 20 mg by mouth daily with supper.     Marland Kitchen spironolactone (ALDACTONE) 25 MG tablet Take 25 mg by mouth daily.     . sildenafil (VIAGRA) 50 MG tablet Take 1 tablet (50 mg total) by mouth daily as needed for erectile dysfunction. (Patient not taking: Reported on 05/17/2019) 10 tablet 0 Not Taking at Unknown time   Scheduled:  . aspirin  81 mg Oral Daily  . Chlorhexidine Gluconate Cloth  6 each Topical Daily  . feeding supplement (PRO-STAT SUGAR FREE 64)  30 mL Oral TID  . heparin  5,000 Units Subcutaneous Q8H  . insulin aspart  0-15 Units Subcutaneous TID WC  . insulin aspart  0-5 Units Subcutaneous QHS  . insulin glargine  10 Units Subcutaneous Daily  . multivitamin with minerals   1 tablet Oral Daily  . polyethylene glycol  17 g Oral Daily   Infusions:  . sodium chloride 250 mL (05/18/19 1158)  . ceFEPime (MAXIPIME) IV 2 g (05/22/19 1506)  . linezolid (ZYVOX) IV 600 mg (05/22/19 1153)  . metronidazole 500 mg (05/22/19 1345)    Assessment: Pt was admitted for necrotizing fasciitis. He was on xarelto for LV thrombus. It has been held here for surgery. We don't know the last administration do prior to admission but it has been a while. Surgery has ok to resume anticoagulation. IV heparin has been ordered for now for bridging. We will start heparin without bolus but at a higher rate.   Hgb 9.5 Plt 354  Goal of Therapy:  Heparin level: 0.3-0.5 Monitor platelets by anticoagulation protocol: Yes   Plan:  No bolus Start heparin at 1500 units/hr F/u with AM heparin level Daily HL and CBC  Onnie Boer, PharmD, BCIDP, AAHIVP, CPP Infectious Disease Pharmacist 05/22/2019 7:17 PM

## 2019-05-22 NOTE — Progress Notes (Signed)
Pharmacy Antibiotic Note  FIRMAN VANDERHEIDE is a 64 y.o. male admitted on 05/14/2019 with sepsis from left groin necrotizing fasciitis/cellulitis. [facility] has been consulted for cefepime  dosing.   Plan: Continue cefepime 2000 mg IV every 8 hours. Continue linezolid, flagyl Pharmacy will continue to monitor renal function, cultures and patient progress.  Height: 5\' 9"  (175.3 cm) Weight: 109 kg (240 lb 4.8 oz) IBW/kg (Calculated) : 70.7  Temp (24hrs), Avg:98 F (36.7 C), Min:97.5 F (36.4 C), Max:98.3 F (36.8 C)    No Known Allergies  Antimicrobials this admission: cefepime 5/4>>    vancomycin 5/4 >>5/9 Linezolid 5/9>> Flagyl 5/8<<  Microbiology results: 5/4 BC x2:  ngtd 5/4 UCx: ng 5/4 Resp PCR: SARS CoV-2:  neg      5/4 MRSA PCR:  negative 5/9 Abscess: GPC, GNR, GPR, final   Majel Giel A. Levada Dy, PharmD, BCPS, FNKF Clinical Pharmacist [place] Please utilize Amion for appropriate phone number to reach the unit pharmacist ([place])   05/22/2019 7:57 AM

## 2019-05-23 LAB — CBC
HCT: 29.8 % — ABNORMAL LOW (ref 39.0–52.0)
Hemoglobin: 9.6 g/dL — ABNORMAL LOW (ref 13.0–17.0)
MCH: 26.2 pg (ref 26.0–34.0)
MCHC: 32.2 g/dL (ref 30.0–36.0)
MCV: 81.4 fL (ref 80.0–100.0)
Platelets: 392 10*3/uL (ref 150–400)
RBC: 3.66 MIL/uL — ABNORMAL LOW (ref 4.22–5.81)
RDW: 17 % — ABNORMAL HIGH (ref 11.5–15.5)
WBC: 19.9 10*3/uL — ABNORMAL HIGH (ref 4.0–10.5)
nRBC: 0.9 % — ABNORMAL HIGH (ref 0.0–0.2)

## 2019-05-23 LAB — GLUCOSE, CAPILLARY
Glucose-Capillary: 167 mg/dL — ABNORMAL HIGH (ref 70–99)
Glucose-Capillary: 235 mg/dL — ABNORMAL HIGH (ref 70–99)
Glucose-Capillary: 164 mg/dL — ABNORMAL HIGH (ref 70–99)
Glucose-Capillary: 223 mg/dL — ABNORMAL HIGH (ref 70–99)

## 2019-05-23 LAB — HEPARIN LEVEL (UNFRACTIONATED): Heparin Unfractionated: 0.21 IU/mL — ABNORMAL LOW (ref 0.30–0.70)

## 2019-05-23 MED ORDER — MUSCLE RUB 10-15 % EX CREA
TOPICAL_CREAM | Freq: Four times a day (QID) | CUTANEOUS | Status: DC
  Administered 2019-05-23 – 2019-05-25 (×2): 1 via TOPICAL
  Filled 2019-05-23: qty 85

## 2019-05-23 MED ORDER — DOCUSATE SODIUM 100 MG PO CAPS
100.0000 mg | ORAL_CAPSULE | Freq: Two times a day (BID) | ORAL | Status: DC
  Administered 2019-05-23 – 2019-05-25 (×5): 100 mg via ORAL
  Filled 2019-05-23 (×6): qty 1

## 2019-05-23 NOTE — Progress Notes (Addendum)
[place] for heparin Indication: LV thrombus  No Known Allergies  Patient Measurements: Height: 5\' 9"  (175.3 cm) Weight: 103.9 kg (229 lb 0.9 oz) IBW/kg (Calculated) : 70.7 Heparin Dosing Weight: 93kg  Vital Signs: Temp: 98.8 F (37.1 C) (05/13 0500) Temp Source: Oral (05/13 0500) BP: 113/71 (05/13 0500) Pulse Rate: 86 (05/13 0500)  Labs: Recent Labs    05/21/19 0715 05/22/19 0224  HGB 10.5* 9.5*  HCT 32.5* 29.3*  PLT 337 354  CREATININE 1.34* 1.24    Estimated Creatinine Clearance: 71.5 mL/min (by C-G formula based on SCr of 1.24 mg/dL).   Assessment: Pt was admitted for necrotizing fasciitis. He was on xarelto for LV thrombus. It has been held here for surgery. We don't know the last administration do prior to admission but it has been a while. Surgery has ok to resume anticoagulation. IV heparin has been ordered for now for bridging. We will start heparin without bolus but at a higher rate.   Patient refused labs 5/13, but finally agreed after told he would have to have Lovenox shots, Heparin level 0.21 this afternoon  Goal of Therapy:  Heparin level: 0.3-0.5 Monitor platelets by anticoagulation protocol: Yes   Plan:  Increase heparin to 1700 units / hr Follow up AM labs - > heparin level, CBC  Thank you Anette Guarneri, PharmD  05/23/2019

## 2019-05-23 NOTE — Progress Notes (Signed)
3 Days Post-Op  Subjective: CC: Patient for dressing change this AM. Tolerating diet.   Objective: Vital signs in last 24 hours: Temp:  [98.2 F (36.8 C)-98.8 F (37.1 C)] 98.8 F (37.1 C) (05/13 0500) Pulse Rate:  [74-86] 86 (05/13 0500) Resp:  [14-19] 17 (05/13 0500) BP: (103-115)/(61-71) 113/71 (05/13 0500) SpO2:  [95 %-99 %] 99 % (05/13 0500) Weight:  [103.9 kg] 103.9 kg (05/13 0447) Last BM Date: 05/18/19  Intake/Output from previous day: 05/12 0701 - 05/13 0700 In: 1842.2 [P.O.:477; I.V.:118.3; IV Piggyback:1246.8] Out: 1575 [Urine:1575] Intake/Output this shift: No intake/output data recorded.  PE: Gen: Awake and alert, NAD Lungs: Normal rate and effort Abd: Soft, ND, NT Skin: Chaperone present. See picture below. Minimal expected bloody drainage on kerlex when removed. Left groin wound with beefy red tissue at base. Skin edges clean. No purulence or dish water like drainage.  Repacked and dressed.      Lab Results:  Recent Labs    05/21/19 0715 05/22/19 0224  WBC 29.1* 28.0*  HGB 10.5* 9.5*  HCT 32.5* 29.3*  PLT 337 354   BMET Recent Labs    05/21/19 0715 05/22/19 0224  NA 136 134*  K 3.4* 3.6  CL 103 102  CO2 24 25  GLUCOSE 182* 179*  BUN 18 16  CREATININE 1.34* 1.24  CALCIUM 7.5* 7.2*   PT/INR No results for input(s): LABPROT, INR in the last 72 hours. CMP     Component Value Date/Time   NA 134 (L) 05/22/2019 0224   NA 141 11/28/2017 1208   K 3.6 05/22/2019 0224   CL 102 05/22/2019 0224   CO2 25 05/22/2019 0224   GLUCOSE 179 (H) 05/22/2019 0224   BUN 16 05/22/2019 0224   BUN 18 11/28/2017 1208   CREATININE 1.24 05/22/2019 0224   CALCIUM 7.2 (L) 05/22/2019 0224   PROT 6.0 (L) 05/22/2019 0224   ALBUMIN 1.3 (L) 05/22/2019 0224   AST 16 05/22/2019 0224   ALT 10 05/22/2019 0224   ALKPHOS 63 05/22/2019 0224   BILITOT 0.6 05/22/2019 0224   GFRNONAA >60 05/22/2019 0224   GFRAA >60 05/22/2019 0224   Lipase     Component Value  Date/Time   LIPASE 40 03/10/2019 2208       Studies/Results: No results found.  Anti-infectives: Anti-infectives (From admission, onward)   Start     Dose/Rate Route Frequency Ordered Stop   05/20/19 1200  linezolid (ZYVOX) IVPB 600 mg     600 mg 300 mL/hr over 60 Minutes Intravenous Every 12 hours 05/20/19 1101     05/20/19 0500  vancomycin (VANCOCIN) IVPB 1000 mg/200 mL premix  Status:  Discontinued     1,000 mg 200 mL/hr over 60 Minutes Intravenous Every 12 hours 05/19/19 1533 05/20/19 1101   05/18/19 2000  metroNIDAZOLE (FLAGYL) IVPB 500 mg     500 mg 100 mL/hr over 60 Minutes Intravenous Every 8 hours 05/18/19 1913     05/18/19 1200  ceFEPIme (MAXIPIME) 2 g in sodium chloride 0.9 % 100 mL IVPB     2 g 200 mL/hr over 30 Minutes Intravenous Every 8 hours 05/18/19 1013     05/15/19 1500  vancomycin (VANCOREADY) IVPB 1250 mg/250 mL  Status:  Discontinued     1,250 mg 166.7 mL/hr over 90 Minutes Intravenous Every 24 hours 05/14/19 1447 05/19/19 1533   05/15/19 0000  ceFEPIme (MAXIPIME) 2 g in sodium chloride 0.9 % 100 mL IVPB  Status:  Discontinued  2 g 200 mL/hr over 30 Minutes Intravenous Every 12 hours 05/14/19 1424 05/18/19 1013   05/14/19 1300  vancomycin (VANCOREADY) IVPB 2000 mg/400 mL     2,000 mg 200 mL/hr over 120 Minutes Intravenous  Once 05/14/19 1223 05/14/19 1506   05/14/19 1200  ceFEPIme (MAXIPIME) 2 g in sodium chloride 0.9 % 100 mL IVPB     2 g 200 mL/hr over 30 Minutes Intravenous  Once 05/14/19 1158 05/14/19 1258   05/14/19 1200  vancomycin (VANCOCIN) IVPB 1000 mg/200 mL premix  Status:  Discontinued     1,000 mg 200 mL/hr over 60 Minutes Intravenous  Once 05/14/19 1158 05/14/19 1223       Assessment/Plan OSA HTN PVD Hx left ventricular thrombus per notes on Xarelto at home CKD w/ AKI - improved Hx COPD Hx CHF DM2 CAD w/ recent NSTEMI (hospitalization 2/28 - 3/2) - On plavix at home  Necrotizing soft tissue infection left  groin S/pDebridement left groin 32cm x 6 cm x 4 cm deep including skin, subcutaneous fat, and fascia, Dr. Grandville Silos, 5/9 - POD #4 S/pdebridement of 15 x 10 cm skin, subcutaneous tissue with some fascial resection, Dr. Kieth Brightly, 5/10, POD #3 - BID WTD dressing changes - per notes it appears he is planning for SNF. If he was to go home, would need to have his significant other come in for dressing change teaching - Cont abx.  Cultures with abundant gram-negative rods, few gram-positive cocci, few gram-positive rods. Discussed with ID, Dr. Tommy Medal, who recommends Augmentin when switched to Oral abx based on Cx report. If WBC downtrending on today's labs, okay to switch to oral abx from our standpoint. Will need a total of 14 days.  - Mobilize, PT - Pulm toilet  FEN -HH/CM diet VTE -SCDs, heparin gtt ID -Vanc 5/4 - 5/10. Cefepime 5/4 >>. Flagyl 5/8 >>Linezolid 5/10 >>WBC down at 28 yesterday. AM labs pending.    LOS: 9 days    Jillyn Ledger , [facility] 05/23/2019, 8:18 AM Please see Amion for pager number during day hours 7:00am-4:30pm

## 2019-05-23 NOTE — Plan of Care (Signed)
  Problem: Education: Goal: Knowledge of General Education information will improve Description Including pain rating scale, medication(s)/side effects and non-pharmacologic comfort measures Outcome: Progressing   

## 2019-05-23 NOTE — Progress Notes (Signed)
PROGRESS NOTE    [name]   [id number]  DOB: 1955/06/19  DOA: 05/14/2019 PCP: System, Pcp Not In   Brief Narrative:  [name] is a 64 y.o. male with medical history significant for CAD, DM, systolic CHF, CKD 3, hypertension, diabetes, cocaine use, OSA, peripheral vascular disease, apical mural thrombus. Patient was brought to the ED via EMS with reports of overdose on tramadol. Patient reported he had taken 3 doses of tramadol he was found in the bathtub, apparently he was soaking his whole left groin/perineal area.  Recent hospitalization 2/28 - 03/12/19 under cardiology service for NSTEMI, troponin peaking at 575, requiring PCI of RCA/PDA, via cardiac cath 03/11/19.  On dual antiplatelets Plavix and aspirin.  Echocardiogram also showed apical filling defect consistent with thrombus/early thrombus formation.  Plan was to continue aspirin and Plavix for 30 days, to discontinue aspirin then and add Xarelto and continue Plavix long-term. Echo showed EF of 30 to 35%, due to history of intermittent compliance, was felt not to be a candidate for ICD.   In ED > given 0.4 of Narcan mental status significantly improved.  Blood pressure dropped to 86/65 in the ED, improved after 3.5 L sepsis fluid bolus was given in ED.  Febrile to 102.6.  Initial tachycardia 104.  Lactic acid was significantly elevated at 6 >> 4.3.  Creatinine elevated at 2.95.   Abdominal/pelvic CT without contrast> extensive inflammatory changes in the left inguinal region extending into the anterior medial upper thigh compatible with acute cellulitis.  Admitted with severe sepsis, AKI and started on Vanc and Cefepime and IVF.  Subjective: Having pain in left ankle today. When asked why he did not get blood work this AM, he states it was too painful. He declines to allow it even after explaining the need for it.     Assessment & Plan:   Principal Problem:   Necrotizing fasciitis, severe sepsis with AKI - 5/8-  debridement - 5/10- repeat debridement - finalized wound culture> gram-negative rods and few gram-positive cocci and few gram-positive rods  - currently on Zyvox, Flagyl and Cefepime- Gen surgery has spoken with Dr Tommy Medal who recommends Augmentin when ready to transition to oral - WBC 28 on 5/12- we don't have blood work for today as he is declining it  Active Problems: AKI- CKD 3b- baseline CR ~ 1.5-1.6 - Cr 2.95 on admission - Cr improved to 1.24    Severe hypoalbuminemia - Alb ~ 1 now- due to above illness -cont Prostat     Cardiomyopathy, ischemic  - last ECHO>  30 to 35%.  Grade 1 d CHF - s/p cath - PCI of PDA and distal RCA - currently no fluid overload - not on diuretics   LV thrombus - Apical filling defect noted on ECHO on 3/1  - on Xarelto at home which is on hold due to debridements and large wound in left thigh- in regards to resumption of anticoagulation, I spoke with Dr Rosendo Gros who spoke with Dr Kieth Brightly- OK to resume Xarelto-   I started with a Heparin infusion to ensure he does not bleed with [facility]    Cocaine abuse and tobacco abuse - cessation discussed    Uncontrolled type 2 diabetes mellitus with hyperglycemia - A1c 9.6 on 05/16/19  - home meds> Lantus, Jardiance, Metformin - cont Lantus and SSI     Opiate overdose  - Tramodol OD on admission reversed with Narcan   Time spent in minutes: 40 DVT prophylaxis:  SCDs Code Status: Full code Family Communication:  Disposition Plan:  Status is: Inpatient  Remains inpatient appropriate because:active infection needing IV antibiotics   Dispo: The patient is from: Home              Anticipated d/c is to: SNF              Anticipated d/c date is: > 3 days              Patient currently is not medically stable to d/c.  Consultants:   General surgery Procedures:    debridement x 2 Antimicrobials:  Anti-infectives (From admission, onward)   Start     Dose/Rate Route Frequency Ordered Stop   05/20/19 1200   linezolid (ZYVOX) IVPB 600 mg     600 mg 300 mL/hr over 60 Minutes Intravenous Every 12 hours 05/20/19 1101     05/20/19 0500  vancomycin (VANCOCIN) IVPB 1000 mg/200 mL premix  Status:  Discontinued     1,000 mg 200 mL/hr over 60 Minutes Intravenous Every 12 hours 05/19/19 1533 05/20/19 1101   05/18/19 2000  metroNIDAZOLE (FLAGYL) IVPB 500 mg     500 mg 100 mL/hr over 60 Minutes Intravenous Every 8 hours 05/18/19 1913     05/18/19 1200  ceFEPIme (MAXIPIME) 2 g in sodium chloride 0.9 % 100 mL IVPB     2 g 200 mL/hr over 30 Minutes Intravenous Every 8 hours 05/18/19 1013     05/15/19 1500  vancomycin (VANCOREADY) IVPB 1250 mg/250 mL  Status:  Discontinued     1,250 mg 166.7 mL/hr over 90 Minutes Intravenous Every 24 hours 05/14/19 1447 05/19/19 1533   05/15/19 0000  ceFEPIme (MAXIPIME) 2 g in sodium chloride 0.9 % 100 mL IVPB  Status:  Discontinued     2 g 200 mL/hr over 30 Minutes Intravenous Every 12 hours 05/14/19 1424 05/18/19 1013   05/14/19 1300  vancomycin (VANCOREADY) IVPB 2000 mg/400 mL     2,000 mg 200 mL/hr over 120 Minutes Intravenous  Once 05/14/19 1223 05/14/19 1506   05/14/19 1200  ceFEPIme (MAXIPIME) 2 g in sodium chloride 0.9 % 100 mL IVPB     2 g 200 mL/hr over 30 Minutes Intravenous  Once 05/14/19 1158 05/14/19 1258   05/14/19 1200  vancomycin (VANCOCIN) IVPB 1000 mg/200 mL premix  Status:  Discontinued     1,000 mg 200 mL/hr over 60 Minutes Intravenous  Once 05/14/19 1158 05/14/19 1223       Objective: Vitals:   05/22/19 2150 05/23/19 0447 05/23/19 0500 05/23/19 1415  BP: 115/66  113/71 93/68  Pulse: 75  86 80  Resp: 14  17 17   Temp: 98.2 F (36.8 C)  98.8 F (37.1 C) 98.4 F (36.9 C)  TempSrc: Oral  Oral Oral  SpO2: 98%  99% 100%  Weight:  103.9 kg    Height:        Intake/Output Summary (Last 24 hours) at 05/23/2019 1502 Last data filed at 05/23/2019 [id number] Gross per 24 hour  Intake 1842.16 ml  Output 1575 ml  Net 267.16 ml   Filed Weights    05/21/19 0500 05/22/19 0500 05/23/19 0447  Weight: 104.1 kg 109 kg 103.9 kg    Examination: General exam: Appears comfortable  HEENT: PERRLA, oral mucosa moist, no sclera icterus or thrush Respiratory system: Clear to auscultation. Respiratory effort normal. Cardiovascular system: S1 & S2 heard,  No murmurs  Gastrointestinal system: Abdomen soft, non-tender, nondistended. Normal bowel sounds  Central nervous system: Alert and oriented. No focal neurological deficits. Extremities: No cyanosis, clubbing or edema Skin: No rashes or ulcers - large incision on left thigh appears clean MSK: tenderness in medial aspect of left ankle Psychiatry:  Mood & affect appropriate.     Data Reviewed: I have personally reviewed following labs and imaging studies  CBC: Recent Labs  Lab 05/18/19 0448 05/19/19 0629 05/20/19 0630 05/21/19 0715 05/22/19 0224  WBC 19.1* 23.6* 26.8* 29.1* 28.0*  HGB 13.3 11.7* 9.9* 10.5* 9.5*  HCT 41.2 36.5* 30.8* 32.5* 29.3*  MCV 79.7* 80.6 80.8 81.5 80.3  PLT 285 273 267 337 [id number]   Basic Metabolic Panel: Recent Labs  Lab 05/18/19 0448 05/19/19 0629 05/20/19 0630 05/21/19 0715 05/22/19 0224  NA 134* 134* 136 136 134*  K 3.5 4.0 3.2* 3.4* 3.6  CL 101 101 106 103 102  CO2 23 20* 23 24 25   GLUCOSE 179* 143* 145* 182* 179*  BUN 23 29* 22 18 16   CREATININE 1.21 1.33* 1.31* 1.34* 1.24  CALCIUM 7.6* 7.3* 6.8* 7.5* 7.2*  MG 2.1  --  2.0  --   --    GFR: Estimated Creatinine Clearance: 71.5 mL/min (by C-G formula based on SCr of 1.24 mg/dL). Liver Function Tests: Recent Labs  Lab 05/19/19 0629 05/20/19 0630 05/21/19 0715 05/22/19 0224  AST 21 13* 15 16  ALT 17 14 11 10   ALKPHOS 59 57 66 63  BILITOT 0.7 0.6 0.7 0.6  PROT 5.5* 5.3* 6.1* 6.0*  ALBUMIN 1.5* 1.3* 1.5* 1.3*   No results for input(s): LIPASE, AMYLASE in the last 168 hours. No results for input(s): AMMONIA in the last 168 hours. Coagulation Profile: No results for input(s): INR, PROTIME  in the last 168 hours. Cardiac Enzymes: No results for input(s): CKTOTAL, CKMB, CKMBINDEX, TROPONINI in the last 168 hours. BNP (last 3 results) No results for input(s): PROBNP in the last 8760 hours. HbA1C: No results for input(s): HGBA1C in the last 72 hours. CBG: Recent Labs  Lab 05/22/19 1147 05/22/19 1637 05/22/19 2155 05/23/19 0749 05/23/19 1238  GLUCAP 165* 145* 195* 167* 235*   Lipid Profile: No results for input(s): CHOL, HDL, LDLCALC, TRIG, CHOLHDL, LDLDIRECT in the last 72 hours. Thyroid Function Tests: No results for input(s): TSH, T4TOTAL, FREET4, T3FREE, THYROIDAB in the last 72 hours. Anemia Panel: No results for input(s): VITAMINB12, FOLATE, FERRITIN, TIBC, IRON, RETICCTPCT in the last 72 hours. Urine analysis:    Component Value Date/Time   COLORURINE YELLOW 05/14/2019 1157   APPEARANCEUR HAZY (A) 05/14/2019 1157   LABSPEC 1.013 05/14/2019 1157   PHURINE 5.0 05/14/2019 1157   GLUCOSEU >=500 (A) 05/14/2019 1157   HGBUR MODERATE (A) 05/14/2019 1157   BILIRUBINUR NEGATIVE 05/14/2019 1157   KETONESUR NEGATIVE 05/14/2019 1157   PROTEINUR 30 (A) 05/14/2019 1157   UROBILINOGEN 0.2 09/27/2014 2237   NITRITE NEGATIVE 05/14/2019 1157   LEUKOCYTESUR NEGATIVE 05/14/2019 1157   Sepsis Labs: @LABRCNTIP (procalcitonin:4,lacticidven:4) ) Recent Results (from the past 240 hour(s))  Urine culture     Status: None   Collection Time: 05/14/19 11:57 AM   Specimen: In/Out Cath Urine  Result Value Ref Range Status   Specimen Description   Final    IN/OUT CATH URINE Performed at [facility], [address], [place], [place] [address]    Special Requests   Final    NONE Performed at [facility], [address] [place], [place] [address]    Culture   Final    NO  GROWTH Performed at [facility], [place] [address] [place], [place] [address]    Report Status 05/15/2019 FINAL  Final  Respiratory Panel by RT PCR (Flu A&B, Covid) - Nasopharyngeal Swab     Status:  None   Collection Time: 05/14/19 12:00 PM   Specimen: Nasopharyngeal Swab  Result Value Ref Range Status   SARS Coronavirus 2 by RT PCR NEGATIVE NEGATIVE Final    Comment: (NOTE) SARS-CoV-2 target nucleic acids are NOT DETECTED. The SARS-CoV-2 RNA is generally detectable in upper respiratoy specimens during the acute phase of infection. The lowest concentration of SARS-CoV-2 viral copies this assay can detect is 131 copies/mL. A negative result does not preclude SARS-Cov-2 infection and should not be used as the sole basis for treatment or other patient management decisions. A negative result may occur with  improper specimen collection/handling, submission of specimen other than nasopharyngeal swab, presence of viral mutation(s) within the areas targeted by this assay, and inadequate number of viral copies (<131 copies/mL). A negative result must be combined with clinical observations, patient history, and epidemiological information. The expected result is Negative. Fact Sheet for Patients:  PinkCheek.be Fact Sheet for Healthcare Providers:  GravelBags.it This test is not yet ap proved or cleared by the [place] FDA and  has been authorized for detection and/or diagnosis of SARS-CoV-2 by FDA under an Emergency Use Authorization (EUA). This EUA will remain  in effect (meaning this test can be used) for the duration of the COVID-19 declaration under Section 564(b)(1) of the Act, 21 U.S.C. section 360bbb-3(b)(1), unless the authorization is terminated or revoked sooner.    Influenza A by PCR NEGATIVE NEGATIVE Final   Influenza B by PCR NEGATIVE NEGATIVE Final    Comment: (NOTE) The Xpert Xpress SARS-CoV-2/FLU/RSV assay is intended as an aid in  the diagnosis of influenza from Nasopharyngeal swab specimens and  should not be used as a sole basis for treatment. Nasal washings and  aspirates are unacceptable for Xpert  Xpress SARS-CoV-2/FLU/RSV  testing. Fact Sheet for Patients: PinkCheek.be Fact Sheet for Healthcare Providers: GravelBags.it This test is not yet approved or cleared by the [place] FDA and  has been authorized for detection and/or diagnosis of SARS-CoV-2 by  FDA under an Emergency Use Authorization (EUA). This EUA will remain  in effect (meaning this test can be used) for the duration of the  Covid-19 declaration under Section 564(b)(1) of the Act, 21  U.S.C. section 360bbb-3(b)(1), unless the authorization is  terminated or revoked. Performed at [facility], [address], [place], [place] [address]   Blood Culture (routine x 2)     Status: None   Collection Time: 05/14/19 12:12 PM   Specimen: BLOOD  Result Value Ref Range Status   Specimen Description BLOOD DRAWN BY IV THERAPY  Final   Special Requests   Final    BOTTLES DRAWN AEROBIC AND ANAEROBIC Blood Culture adequate volume   Culture   Final    NO GROWTH 5 DAYS Performed at [facility], [address], [place], [place] [address]    Report Status 05/19/2019 FINAL  Final  Blood Culture (routine x 2)     Status: None   Collection Time: 05/14/19 12:20 PM   Specimen: BLOOD RIGHT HAND  Result Value Ref Range Status   Specimen Description BLOOD RIGHT HAND  Final   Special Requests   Final    BOTTLES DRAWN AEROBIC AND ANAEROBIC Blood Culture results may not be optimal due to an inadequate  volume of blood received in culture bottles   Culture   Final    NO GROWTH 5 DAYS Performed at [facility], [address] [place], [place] [address]    Report Status 05/19/2019 FINAL  Final  MRSA PCR Screening     Status: None   Collection Time: 05/14/19 12:40 PM   Specimen: Nasal Mucosa; Nasopharyngeal  Result Value Ref Range Status   MRSA by PCR NEGATIVE NEGATIVE Final    Comment:        The GeneXpert MRSA Assay (FDA approved for NASAL specimens only), is one component  of a comprehensive MRSA colonization surveillance program. It is not intended to diagnose MRSA infection nor to guide or monitor treatment for MRSA infections. Performed at [facility], [address] [place], [place] [address]   Culture, blood (Routine X 2) w Reflex to ID Panel     Status: None   Collection Time: 05/15/19  5:11 PM   Specimen: BLOOD RIGHT WRIST  Result Value Ref Range Status   Specimen Description BLOOD RIGHT WRIST  Final   Special Requests   Final    BOTTLES DRAWN AEROBIC AND ANAEROBIC Blood Culture adequate volume   Culture   Final    NO GROWTH 5 DAYS Performed at [facility], [address], [place], [place] [address]    Report Status 05/20/2019 FINAL  Final  Culture, blood (Routine X 2) w Reflex to ID Panel     Status: None   Collection Time: 05/15/19  5:11 PM   Specimen: BLOOD RIGHT ARM  Result Value Ref Range Status   Specimen Description BLOOD RIGHT ARM  Final   Special Requests   Final    BOTTLES DRAWN AEROBIC AND ANAEROBIC Blood Culture adequate volume   Culture   Final    NO GROWTH 5 DAYS Performed at [facility], [address] [place], [place] [address]    Report Status 05/20/2019 FINAL  Final  Aerobic/Anaerobic Culture (surgical/deep wound)     Status: None   Collection Time: 05/19/19  1:15 AM   Specimen: PATH Other; Abscess  Result Value Ref Range Status   Specimen Description ABSCESS LEFT GROIN  Final   Special Requests NONE  Final   Gram Stain   Final    FEW WBC PRESENT, PREDOMINANTLY PMN ABUNDANT GRAM NEGATIVE RODS FEW GRAM POSITIVE COCCI FEW GRAM POSITIVE RODS Performed at [facility], [place] [address], [place], [place] [address]    Culture   Final    MIXED ANAEROBIC FLORA PRESENT.  CALL LAB IF FURTHER IID REQUIRED.   Report Status 05/21/2019 FINAL  Final         Radiology Studies: No results found.    Scheduled Meds: . aspirin  81 mg Oral Daily  . Chlorhexidine Gluconate Cloth  6 each Topical Daily  . docusate  sodium  100 mg Oral BID  . feeding supplement (PRO-STAT SUGAR FREE 64)  30 mL Oral TID  . insulin aspart  0-15 Units Subcutaneous TID WC  . insulin aspart  0-5 Units Subcutaneous QHS  . insulin glargine  10 Units Subcutaneous Daily  . multivitamin with minerals  1 tablet Oral Daily  . Muscle Rub   Topical QID  . polyethylene glycol  17 g Oral Daily   Continuous Infusions: . sodium chloride 250 mL (05/18/19 1158)  . ceFEPime (MAXIPIME) IV 2 g (05/23/19 1258)  . heparin 1,500 Units/hr (05/23/19 1256)  . linezolid (ZYVOX) IV 600 mg (05/23/19 1041)  .  metronidazole 500 mg (05/23/19 1350)     LOS: 9 days      Debbe Odea, MD [facility] Pager: [email or web address] 05/23/2019, 3:02 PM

## 2019-05-24 LAB — GLUCOSE, CAPILLARY
Glucose-Capillary: 224 mg/dL — ABNORMAL HIGH (ref 70–99)
Glucose-Capillary: 234 mg/dL — ABNORMAL HIGH (ref 70–99)
Glucose-Capillary: 101 mg/dL — ABNORMAL HIGH (ref 70–99)
Glucose-Capillary: 243 mg/dL — ABNORMAL HIGH (ref 70–99)

## 2019-05-24 LAB — SARS CORONAVIRUS 2 BY RT PCR (HOSPITAL ORDER, PERFORMED IN ~~LOC~~ HOSPITAL LAB): SARS Coronavirus 2: NEGATIVE

## 2019-05-24 LAB — CBC
HCT: 31.4 % — ABNORMAL LOW (ref 39.0–52.0)
Hemoglobin: 10 g/dL — ABNORMAL LOW (ref 13.0–17.0)
MCH: 26.1 pg (ref 26.0–34.0)
MCHC: 31.8 g/dL (ref 30.0–36.0)
MCV: 82 fL (ref 80.0–100.0)
Platelets: 433 10*3/uL — ABNORMAL HIGH (ref 150–400)
RBC: 3.83 MIL/uL — ABNORMAL LOW (ref 4.22–5.81)
RDW: 17.2 % — ABNORMAL HIGH (ref 11.5–15.5)
WBC: 21.7 10*3/uL — ABNORMAL HIGH (ref 4.0–10.5)
nRBC: 0.8 % — ABNORMAL HIGH (ref 0.0–0.2)

## 2019-05-24 LAB — HEPARIN LEVEL (UNFRACTIONATED): Heparin Unfractionated: 0.3 IU/mL (ref 0.30–0.70)

## 2019-05-24 MED ORDER — MORPHINE SULFATE (PF) 2 MG/ML IV SOLN
2.0000 mg | Freq: Once | INTRAVENOUS | Status: AC
  Administered 2019-05-24: 2 mg via INTRAVENOUS
  Filled 2019-05-24: qty 1

## 2019-05-24 MED ORDER — SIMETHICONE 80 MG PO CHEW: 80.0000 mg | CHEWABLE_TABLET | Freq: Four times a day (QID) | ORAL | 0 refills | Status: DC | PRN

## 2019-05-24 MED ORDER — POLYETHYLENE GLYCOL 3350 17 G PO PACK: 17.0000 g | PACK | Freq: Every day | ORAL | 0 refills | Status: DC

## 2019-05-24 MED ORDER — ACETAMINOPHEN 325 MG PO TABS: 650.0000 mg | ORAL_TABLET | Freq: Four times a day (QID) | ORAL | Status: DC | PRN

## 2019-05-24 MED ORDER — ADULT MULTIVITAMIN W/MINERALS CH: 1.0000 | ORAL_TABLET | Freq: Every day | ORAL | Status: DC

## 2019-05-24 MED ORDER — MUSCLE RUB 10-15 % EX CREA: 1.0000 "application " | TOPICAL_CREAM | Freq: Four times a day (QID) | CUTANEOUS | 0 refills | Status: DC

## 2019-05-24 MED ORDER — RIVAROXABAN 20 MG PO TABS
20.0000 mg | ORAL_TABLET | Freq: Every day | ORAL | Status: DC
  Administered 2019-05-24 – 2019-05-27 (×4): 20 mg via ORAL
  Filled 2019-05-24 (×4): qty 1

## 2019-05-24 MED ORDER — RIVAROXABAN 20 MG PO TABS: 20.0000 mg | ORAL_TABLET | Freq: Every day | ORAL | Status: DC

## 2019-05-24 MED ORDER — METRONIDAZOLE 500 MG PO TABS: 500.0000 mg | ORAL_TABLET | Freq: Three times a day (TID) | ORAL | Status: DC

## 2019-05-24 MED ORDER — INSULIN GLARGINE 100 UNIT/ML ~~LOC~~ SOLN: 14.0000 [IU] | Freq: Every day | SUBCUTANEOUS | 11 refills | Status: DC

## 2019-05-24 MED ORDER — AMOXICILLIN-POT CLAVULANATE 875-125 MG PO TABS
1.0000 | ORAL_TABLET | Freq: Two times a day (BID) | ORAL | 0 refills | Status: AC
Start: 2019-05-24 — End: 2019-06-07

## 2019-05-24 MED ORDER — OXYCODONE HCL 5 MG PO TABS: 5.0000 mg | ORAL_TABLET | Freq: Four times a day (QID) | ORAL | 0 refills | Status: DC | PRN

## 2019-05-24 MED ORDER — ALUM & MAG HYDROXIDE-SIMETH 200-200-20 MG/5ML PO SUSP: 30.0000 mL | Freq: Four times a day (QID) | ORAL | 0 refills | Status: DC | PRN

## 2019-05-24 MED ORDER — AMOXICILLIN-POT CLAVULANATE 875-125 MG PO TABS
1.0000 | ORAL_TABLET | Freq: Two times a day (BID) | ORAL | Status: DC
  Administered 2019-05-24 – 2019-05-27 (×7): 1 via ORAL
  Filled 2019-05-24 (×7): qty 1

## 2019-05-24 MED ORDER — LINEZOLID 600 MG PO TABS
600.0000 mg | ORAL_TABLET | Freq: Two times a day (BID) | ORAL | Status: DC
  Filled 2019-05-24: qty 1

## 2019-05-24 MED ORDER — KETOROLAC TROMETHAMINE 15 MG/ML IJ SOLN
15.0000 mg | Freq: Once | INTRAMUSCULAR | Status: AC
  Administered 2019-05-24: 15 mg via INTRAVENOUS
  Filled 2019-05-24: qty 1

## 2019-05-24 MED ORDER — INSULIN GLARGINE 100 UNIT/ML ~~LOC~~ SOLN
14.0000 [IU] | Freq: Every day | SUBCUTANEOUS | Status: DC
  Administered 2019-05-24 – 2019-05-26 (×3): 14 [IU] via SUBCUTANEOUS
  Filled 2019-05-24 (×3): qty 0.14

## 2019-05-24 MED ORDER — METOPROLOL SUCCINATE ER 25 MG PO TB24: 12.5000 mg | ORAL_TABLET | Freq: Every day | ORAL | 3 refills | Status: DC

## 2019-05-24 MED ORDER — DOCUSATE SODIUM 100 MG PO CAPS: 100.0000 mg | ORAL_CAPSULE | Freq: Two times a day (BID) | ORAL | 0 refills | Status: DC

## 2019-05-24 MED ORDER — PANTOPRAZOLE SODIUM 20 MG PO TBEC
20.0000 mg | DELAYED_RELEASE_TABLET | Freq: Once | ORAL | Status: AC
  Administered 2019-05-24: 20 mg via ORAL
  Filled 2019-05-24: qty 1

## 2019-05-24 MED ORDER — PRO-STAT SUGAR FREE PO LIQD: 30.0000 mL | Freq: Three times a day (TID) | ORAL | 0 refills | Status: DC

## 2019-05-24 MED ORDER — NOVOLOG FLEXPEN 100 UNIT/ML ~~LOC~~ SOPN: 1.0000 [IU] | PEN_INJECTOR | Freq: Three times a day (TID) | SUBCUTANEOUS | 11 refills | Status: DC

## 2019-05-24 NOTE — TOC Progression Note (Addendum)
Transition of Care [facility]) - Progression Note    Patient Details  Name: ALDYN SWENOR MRN: [id number] Date of Birth: 1955-12-03  Transition of Care [facility]) CM/SW [place], [place] Phone Number: 05/24/2019, 12:07 PM  Clinical Narrative:    1:49pm-CSW spoke with pt daughter Olena Leatherwood, [phone]). Introduced self, role, reason for call. Pt daughter lives in [place] and just wanted an update. We discussed placement and insurance. She is in agreement with plan and just wants an update when pt ready. She inquired about pt getting COVID vaccine before discharge and CSW explained that we currently arent able to do so but encouraged her to see if SNF could arrange that for pt through the [facility] Department.  12:07pm- CSW spoke with pt at bedside. Let him know that MD is considering him stable for d/c and that he needs to decide regarding a SNF placement. Pt has been given the CMS ratings list.   He prefers to d/c to [facility] due to proximity to home. CSW has intiated insurance auth but it is currently still pending under [id number].    Expected Discharge Plan: Skilled Nursing Facility Barriers to Discharge: Continued Medical Work up, Ship broker  Expected Discharge Plan and Services Expected Discharge Plan: [place] In-house Referral: Clinical Social Work Discharge Planning Services: CM Consult Post Acute Care Choice: [place] Living arrangements for the past 2 months: Single Family Home Expected Discharge Date: 05/24/19      Readmission Risk Interventions Readmission Risk Prevention Plan 05/21/2019  Transportation Screening Complete  PCP or Specialist Appt within 3-5 Days Not Complete  Not Complete comments rec for SNF  HRI or [place] Complete  Social Work Consult for [place] Planning/Counseling Complete  Palliative Care Screening Not Applicable  Medication Review Press photographer)  Referral to Pharmacy  Some recent data might be hidden

## 2019-05-24 NOTE — Progress Notes (Deleted)
Pharmacy Antibiotic Note  GRACIELA FRIEDLY is a 63 y.o. male admitted on 05/14/2019 with sepsis from left groin necrotizing fasciitis/cellulitis and started on antibiotics  WBC trending down, afebrile  To complete 14 days of antibiotics  Plan: Continue cefepime 2000 mg IV every 8 hours. Continue linezolid, flagyl - > changed to po    Height: 5\' 9"  (175.3 cm) Weight: 102.8 kg (226 lb 9.6 oz) IBW/kg (Calculated) : 70.7  Temp (24hrs), Avg:98.3 F (36.8 C), Min:98.1 F (36.7 C), Max:98.4 F (36.9 C)    No Known Allergies  Antimicrobials this admission: cefepime 5/4>>    vancomycin 5/4 >>5/9 Linezolid 5/9>> Flagyl 5/8<<  Microbiology results: 5/4 BC x2:  ngtd 5/4 UCx: ng 5/4 Resp PCR: SARS CoV-2:  neg      5/4 MRSA PCR:  negative 5/9 Abscess: GPC, GNR, GPR, final  Thank you Anette Guarneri, PharmD  Please utilize Amion for appropriate phone number to reach the unit pharmacist ([place])   05/24/2019 8:42 AM

## 2019-05-24 NOTE — Care Management (Signed)
TOC received a message , that patient's daughter wants TOC to call her. NCM spoke with patient at bedside. Patient has one daughter Lequan Villarroel and patient consents for [facility] to talk to [place].   Magdalen Spatz

## 2019-05-24 NOTE — Progress Notes (Addendum)
4 Days Post-Op  Subjective: CC: For dressing change this morning.   Objective: Vital signs in last 24 hours: Temp:  [98.1 F (36.7 C)-98.4 F (36.9 C)] 98.1 F (36.7 C) (05/14 0458) Pulse Rate:  [79-80] 79 (05/14 0458) Resp:  [15-17] 15 (05/14 0458) BP: (93-114)/(62-75) 114/75 (05/14 0458) SpO2:  [97 %-100 %] 99 % (05/14 0458) Weight:  [102.8 kg] 102.8 kg (05/14 0500) Last BM Date: 05/17/19(Pt. states he is unsure of last BM, )  Intake/Output from previous day: 05/13 0701 - 05/14 0700 In: 1117.1 [P.O.:360; I.V.:153.7; IV Piggyback:603.5] Out: 950 [Urine:950] Intake/Output this shift: No intake/output data recorded.  PE: Gen: Awake and alert, NAD Lungs: Normal rate and effort Abd: Soft, ND, NT Skin: Chaperone present. See picture below. Minimal expected bloody drainage on kerlex when removed. Left groin wound with beefy red tissue at base. Skin edges clean. No purulence or dish water like drainage.  Repacked and dressed.     Lab Results:  Recent Labs    05/23/19 2047 05/24/19 0151  WBC 19.9* 21.7*  HGB 9.6* 10.0*  HCT 29.8* 31.4*  PLT 392 433*   BMET Recent Labs    05/22/19 0224  NA 134*  K 3.6  CL 102  CO2 25  GLUCOSE 179*  BUN 16  CREATININE 1.24  CALCIUM 7.2*   PT/INR No results for input(s): LABPROT, INR in the last 72 hours. CMP     Component Value Date/Time   NA 134 (L) 05/22/2019 0224   NA 141 11/28/2017 1208   K 3.6 05/22/2019 0224   CL 102 05/22/2019 0224   CO2 25 05/22/2019 0224   GLUCOSE 179 (H) 05/22/2019 0224   BUN 16 05/22/2019 0224   BUN 18 11/28/2017 1208   CREATININE 1.24 05/22/2019 0224   CALCIUM 7.2 (L) 05/22/2019 0224   PROT 6.0 (L) 05/22/2019 0224   ALBUMIN 1.3 (L) 05/22/2019 0224   AST 16 05/22/2019 0224   ALT 10 05/22/2019 0224   ALKPHOS 63 05/22/2019 0224   BILITOT 0.6 05/22/2019 0224   GFRNONAA >60 05/22/2019 0224   GFRAA >60 05/22/2019 0224   Lipase     Component Value Date/Time   LIPASE 40 03/10/2019  2208       Studies/Results: No results found.  Anti-infectives: Anti-infectives (From admission, onward)   Start     Dose/Rate Route Frequency Ordered Stop   05/20/19 1200  linezolid (ZYVOX) IVPB 600 mg     600 mg 300 mL/hr over 60 Minutes Intravenous Every 12 hours 05/20/19 1101     05/20/19 0500  vancomycin (VANCOCIN) IVPB 1000 mg/200 mL premix  Status:  Discontinued     1,000 mg 200 mL/hr over 60 Minutes Intravenous Every 12 hours 05/19/19 1533 05/20/19 1101   05/18/19 2000  metroNIDAZOLE (FLAGYL) IVPB 500 mg     500 mg 100 mL/hr over 60 Minutes Intravenous Every 8 hours 05/18/19 1913     05/18/19 1200  ceFEPIme (MAXIPIME) 2 g in sodium chloride 0.9 % 100 mL IVPB     2 g 200 mL/hr over 30 Minutes Intravenous Every 8 hours 05/18/19 1013     05/15/19 1500  vancomycin (VANCOREADY) IVPB 1250 mg/250 mL  Status:  Discontinued     1,250 mg 166.7 mL/hr over 90 Minutes Intravenous Every 24 hours 05/14/19 1447 05/19/19 1533   05/15/19 0000  ceFEPIme (MAXIPIME) 2 g in sodium chloride 0.9 % 100 mL IVPB  Status:  Discontinued     2 g 200  mL/hr over 30 Minutes Intravenous Every 12 hours 05/14/19 1424 05/18/19 1013   05/14/19 1300  vancomycin (VANCOREADY) IVPB 2000 mg/400 mL     2,000 mg 200 mL/hr over 120 Minutes Intravenous  Once 05/14/19 1223 05/14/19 1506   05/14/19 1200  ceFEPIme (MAXIPIME) 2 g in sodium chloride 0.9 % 100 mL IVPB     2 g 200 mL/hr over 30 Minutes Intravenous  Once 05/14/19 1158 05/14/19 1258   05/14/19 1200  vancomycin (VANCOCIN) IVPB 1000 mg/200 mL premix  Status:  Discontinued     1,000 mg 200 mL/hr over 60 Minutes Intravenous  Once 05/14/19 1158 05/14/19 1223       Assessment/Plan OSA HTN PVD Hx left ventricular thrombus per notes on Xarelto at home CKD w/ AKI - improved Hx COPD Hx CHF DM2 CAD w/ recent NSTEMI (hospitalization 2/28 - 3/2) - On plavix at home  Necrotizing soft tissue infection left groin S/pDebridement left groin 32cm x 6 cm x 4  cm deep including skin, subcutaneous fat, and fascia, Dr. Grandville Silos, 5/9 - POD #5 S/pdebridement of 15 x 10 cm skin, subcutaneous tissue with some fascial resection, Dr. Kieth Brightly, 5/10, POD #4 - BID WTD dressing changes- per notes it appears he is planning for SNF. If he was to go home, would need to have his significant other come in for dressing change teaching - Cont abx.Cultures with abundant gram-negative rods, few gram-positive cocci, few gram-positive rods. Mixed anaerobic. Discussed with ID, Dr. Tommy Medal, who recommends Augmentin when switched to Oral abx based on Cx report. Okay to switch to oral abx from our standpoint. Will need a total of 14 days.  - Mobilize, PT - Pulm toilet - We will see again on Monday if patient is still here.   FEN -HH/CM diet VTE -SCDs, heparin gtt (hgb stable - okay to transition to home meds) ID -Vanc 5/4 - 5/10. Cefepime 5/4 >>. Flagyl 5/8 >>Linezolid 5/10 >>WBCdown since admission at 21.7 Follow-Up - DOW    LOS: 10 days    Jillyn Ledger , [facility] 05/24/2019, 7:45 AM Please see Amion for pager number during day hours 7:00am-4:30pm

## 2019-05-24 NOTE — Discharge Instructions (Addendum)
Wet to Dry WOUND CARE: - Change dressing twice daily - Supplies: sterile saline, kerlex, scissors, ABD pads, tape  1. Remove dressing and all packing carefully, moistening with sterile saline as needed to avoid packing/internal dressing sticking to the wound. 2.   Clean edges of skin around the wound with water/gauze, making sure there is no tape debris or leakage left on skin that could cause skin irritation or breakdown. 3.   Dampen and clean kerlex with sterile saline and pack wound from wound base to skin level, making sure to take note of any possible areas of wound tracking, tunneling and packing appropriately. Wound can be packed loosely. Trim kerlex to size if a whole kerlex is not required. 4.   Cover wound with a dry ABD pad and secure with tape.  5.   Write the date/time on the dry dressing/tape to better track when the last dressing change occurred. - apply any skin protectant/powder if recommended by clinician to protect skin/skin folds. - change dressing as needed if leakage occurs, wound gets contaminated, or patient requests to shower. - You may shower daily with wound open and following the shower the wound should be dried and a clean dressing placed.  - Medical grade tape as well as packing supplies can be found at [facility] on [address]. The remaining supplies can be found at your local drug store, [place] etc.  Information on my medicine - XARELTO (rivaroxaban)  This medication education was reviewed with me or my healthcare representative as part of my discharge preparation.   WHY WAS XARELTO PRESCRIBED FOR YOU? Xarelto was prescribed to treat blood clots that may have been found.  What do you need to know about Xarelto? The dose is one 20 mg tablet taken ONCE A DAY with your evening meal.  DO NOT stop taking Xarelto without talking to the health care provider who prescribed the medication.  Refill your prescription for 20 mg tablets before you run  out.  After discharge, you should have regular check-up appointments with your healthcare provider that is prescribing your Xarelto.  In the future your dose may need to be changed if your kidney function changes by a significant amount.  What do you do if you miss a dose? If you are taking Xarelto TWICE DAILY and you miss a dose, take it as soon as you remember. You may take two 15 mg tablets (total 30 mg) at the same time then resume your regularly scheduled 15 mg twice daily the next day.  If you are taking Xarelto ONCE DAILY and you miss a dose, take it as soon as you remember on the same day then continue your regularly scheduled once daily regimen the next day. Do not take two doses of Xarelto at the same time.   Important Safety Information Xarelto is a blood thinner medicine that can cause bleeding. You should call your healthcare provider right away if you experience any of the following: ? Bleeding from an injury or your nose that does not stop. ? Unusual colored urine (red or dark brown) or unusual colored stools (red or black). ? Unusual bruising for unknown reasons. ? A serious fall or if you hit your head (even if there is no bleeding).  Some medicines may interact with Xarelto and might increase your risk of bleeding while on Xarelto. To help avoid this, consult your healthcare provider or pharmacist prior to using any new prescription or non-prescription medications, including herbals, vitamins, non-steroidal anti-inflammatory drugs (  NSAIDs) and supplements.  This website has more information on Xarelto: [email or web address]

## 2019-05-24 NOTE — Discharge Summary (Addendum)
Physician Discharge Summary  [name] [id number] DOB: 1955/11/03 DOA: 05/14/2019  PCP: System, Pcp Not In  Admit date: 05/14/2019 Discharge date: 05/27/2019  Admitted From: home  Disposition:  SNF   Recommendations for Outpatient Follow-up:  1. Please continue Augmentin for 14 more days- adjust dose duration as needed based on appearance of wound 2. Wound care> Cleanse with NS, pat gently dry. Fill defects with saline moistened roll gauze (make sure to make medial aspect of wound where there is a 3cm cavity and the proximal aspect of the wound under the abdominal fold), top with dry gauze, ABD pads and secure with tape. Change PRN for soiling, otherwise twice daily. 3. F/u BP on Toprol and Resume Cozaar for ischemic cardiomyopathy 4. Daily weights- resume Aldactone as needed   Discharge Condition:  stable   CODE STATUS:  Full code   Diet recommendation:  Heart healthy and diabetic diet Consultants:   General surgery Procedures:    debridement x 2- see below   Discharge Diagnoses:  Principal Problem:   Necrotizing fasciitis ([place]) Active Problems:   Severe sepsis ([place])   Acute renal failure superimposed on stage 3b chronic kidney disease ([place])   Opiate overdose ([place])   Tobacco use disorder   Cardiomyopathy, ischemic-EF 40-45% by echo 09/07/13   Essential hypertension   Cocaine abuse ([place])   Uncontrolled type 2 diabetes mellitus with hyperglycemia ([facility])   Severe hypoalbuminemia     Brief Summary: [name] is a 64 y.o. male with medical history significant for uncontrolled, CKD 3, hypertension, diabetes, cocaine use,  peripheral vascular disease, morbid obesity, apical mural thrombus and ischemic cardiomyopathy. Patient was brought to the ED via EMSwithreports of overdose on tramadol. Patient reported he had taken 3 doses of tramadol he was found in the bathtub,apparently he was soaking his whole left groin/perineal area.   In ED > given 0.4 of Narcan  mental status significantly improved. Blood pressure dropped to 86/65 in the ED, improved after 3.5 L sepsis fluid bolus was given in ED. Febrile to 102.6. Initial tachycardia 104. Lactic acid was significantly elevated at 6 >> 4.3.Creatinine elevated at 2.95.  Abdominal/pelvic CT without contrast> extensive inflammatory changes in the left inguinal region extending into the anterior medial upper thigh compatible with acute cellulitis.   Recent hospitalization2/28 - 3/2/21under cardiology service for NSTEMI, troponin peaking at 575, requiring PCI of RCA/PDA,via cardiac cath3/1/21.On dual antiplatelets Plavix and aspirin. Echocardiogram also showed apical filling defect consistent with thrombus/early thrombus formation. Plan was to continue aspirin and Plavix for 30 days, to discontinue aspirin then and addXarelto and continue Plavix long-term.Echo showed EF of 30 to 35%, due to history of intermittent compliance, was felt not to be a candidate for ICD.    Admitted for severe sepsis, AKI and started on Vanc and Cefepime and IVF.  Hospital Course:  Necrotizing fasciitis, severe sepsis with AKI - 5/8- debridement by general surgery in OR - 5/10- repeat debridement in OR- has extensive wound now - finalized wound culture> gram-negative rods and few gram-positive cocci and few gram-positive rods - no speciation - in hospital, received Zyvox, Flagyl and Cefepime- Gen surgery has spoken with Dr Tommy Medal who recommended Augmentin   - WBC 28 on 5/12- improved to 21.7- per gen surgery, wound looks clean today  - will prescribe Augmentin for 2 wks- this course can be modified based on appearance of wound while at SNF   Active Problems: AKI- CKD 3b- baseline CR ~ 1.5-1.6 - prerenal-  occuring in setting of Aldactone, hypotension and infection - Cr 2.95 on admission - Cr improved to 1.24 - we have continued to hold aldactone as he has not been fluid overloaded     Cardiomyopathy,  ischemic  - last ECHO 03/11/19 >  30 to 35%.  Grade 1 d CHF - s/p cath - PCI of PDA and distal RCA- cont ASA and Plavix along with Xarelto for below - currently no fluid overload -  - SBP has been persistently low but not < 90 - resume Toprol at 12.5 mg daily instead of 25mg - cont to hold Cozaar - please f/u BP    LV thrombus - Apical filling defect noted on ECHO on 3/1  - on Xarelto at home which was on hold due to debridements and large wound in left thigh- -we resumed Heparin on 5/12 and monitored bleeding from wound- wound remained stable and thus he was switched to Xarelto on 5/14- he is also on aspirin  Severe hypoalbuminemia - Alb ~ 1 now- due to above illness -cont Prostat    Cocaine abuse and tobacco abuse - cessation discussed    Uncontrolled type 2 diabetes mellitus with hyperglycemia - A1c 9.6 on 05/16/19  - home meds> Lantus, Jardiance, Metformin - cont Lantus and SSI     Opiate overdose  - Tramodol OD on admission reversed with Narcan  Morbid obesity Body mass index is 33.99 kg/m.   Left ankle pain - tender in medial malleolus for about 5 days- no edema on exam- has good ROM - likely related to having to adjust positioning of left leg during hospital stay for nectotizing fascitis in same leg - treating conservatively with muscle rub- he stated the pain is improving  Discharge Exam: Vitals:   05/26/19 2053 05/27/19 0421  BP: 116/61 118/62  Pulse: (!) 102 100  Resp: 18 20  Temp: 98.5 F (36.9 C) 98.6 F (37 C)  SpO2: 97% 98%   Vitals:   05/26/19 0542 05/26/19 1429 05/26/19 2053 05/27/19 0421  BP: 119/67 113/70 116/61 118/62  Pulse: 99 94 (!) 102 100  Resp: 20 20 18 20   Temp: 98.5 F (36.9 C) 98.6 F (37 C) 98.5 F (36.9 C) 98.6 F (37 C)  TempSrc: Oral Oral  Oral  SpO2: 94% 98% 97% 98%  Weight:    104.4 kg  Height:        General: Pt is alert, awake, not in acute distress Cardiovascular: RRR, S1/S2 +, no rubs, no gallops Respiratory: CTA  bilaterally, no wheezing, no rhonchi Abdominal: Soft, NT, ND, bowel sounds + Extremities: no edema, no cyanosis     Discharge Instructions  Discharge Instructions    Diet - low sodium heart healthy   Complete by: As directed    Diet Carb Modified   Complete by: As directed    Increase activity slowly   Complete by: As directed    Increase activity slowly   Complete by: As directed      Allergies as of 05/27/2019   No Known Allergies     Medication List    STOP taking these medications   losartan 25 MG tablet Commonly known as: COZAAR   nicotine 14 mg/24hr patch Commonly known as: NICODERM CQ - dosed in mg/24 hours   sildenafil 50 MG tablet Commonly known as: VIAGRA   spironolactone 25 MG tablet Commonly known as: ALDACTONE     TAKE these medications   acetaminophen 325 MG tablet Commonly known as: TYLENOL Take  2 tablets (650 mg total) by mouth every 6 (six) hours as needed for mild pain (or Fever >/= 101).   alum & mag hydroxide-simeth 200-200-20 MG/5ML suspension Commonly known as: MAALOX/MYLANTA Take 30 mLs by mouth every 6 (six) hours as needed for indigestion or heartburn.   amoxicillin-clavulanate 875-125 MG tablet Commonly known as: Augmentin Take 1 tablet by mouth every 12 (twelve) hours for 14 days.   aspirin EC 81 MG tablet Take 1 tablet (81 mg total) by mouth daily.   clopidogrel 75 MG tablet Commonly known as: PLAVIX Take 1 tablet (75 mg total) by mouth daily with breakfast.   docusate sodium 100 MG capsule Commonly known as: COLACE Take 1 capsule (100 mg total) by mouth 2 (two) times daily.   empagliflozin 25 MG Tabs tablet Commonly known as: JARDIANCE Take 12.5 mg by mouth daily.   feeding supplement (PRO-STAT SUGAR FREE 64) Liqd Take 30 mLs by mouth 3 (three) times daily.   gabapentin 300 MG capsule Commonly known as: NEURONTIN Take 300 mg by mouth 3 (three) times daily.   insulin glargine 100 UNIT/ML injection Commonly known  as: LANTUS Inject 0.18 mLs (18 Units total) into the skin daily. What changed:   how much to take  when to take this   metFORMIN 1000 MG tablet Commonly known as: GLUCOPHAGE Take 1 tablet (1,000 mg total) by mouth 2 (two) times daily with a meal. Do not restart Metformin until Wednesday am 7/2 What changed: additional instructions   metoprolol succinate 25 MG 24 hr tablet Commonly known as: TOPROL-XL Take 0.5 tablets (12.5 mg total) by mouth daily. What changed: how much to take   multivitamin with minerals Tabs tablet Take 1 tablet by mouth daily.   Muscle Rub 10-15 % Crea Apply 1 application topically 4 (four) times daily. Apply to medial left ankle QID   nitroGLYCERIN 0.4 MG SL tablet Commonly known as: NITROSTAT Place 1 tablet (0.4 mg total) under the tongue every 5 (five) minutes x 3 doses as needed for chest pain.   NovoLOG FlexPen 100 UNIT/ML FlexPen Generic drug: insulin aspart Inject 1-15 Units into the skin 3 (three) times daily with meals. CBG 121 - 150: 2 units  CBG 151 - 200: 3 units  CBG 201 - 250: 5 units  CBG 251 - 300: 8 units  CBG 301 - 350: 11 units  CBG 351 - 400: 15 units   oxyCODONE 5 MG immediate release tablet Commonly known as: Oxy IR/ROXICODONE Take 1-2 tablets (5-10 mg total) by mouth every 6 (six) hours as needed for moderate pain or severe pain (5mg  for moderate pain, 10mg  for severe pain- give 10mg  30-45 prior to dressing changes).   pantoprazole 40 MG tablet Commonly known as: PROTONIX Take 1 tablet (40 mg total) by mouth daily.   polyethylene glycol 17 g packet Commonly known as: MIRALAX / GLYCOLAX Take 17 g by mouth daily.   pravastatin 80 MG tablet Commonly known as: PRAVACHOL Take 1 tablet (80 mg total) by mouth daily at 6 PM.   rivaroxaban 20 MG Tabs tablet Commonly known as: XARELTO Take 1 tablet (20 mg total) by mouth daily with breakfast. What changed: when to take this   simethicone 80 MG chewable tablet Commonly known  as: MYLICON Chew 1 tablet (80 mg total) by mouth 4 (four) times daily as needed for flatulence.       Contact information for follow-up providers    Surgery, [facility]. Go on 06/11/2019.  Specialty: General Surgery Why: 06/01 at 10 am. Please arrive to your appointment 30 minutes early. Please bring a copy of your photo ID and insurance card to the appointment.  Contact information: [place] [phone]            Contact information for after-discharge care    [facility] .   Service: Skilled Nursing Contact information: [address] [place] [phone]                 No Known Allergies    CT ABDOMEN PELVIS WO CONTRAST  Result Date: 05/18/2019 CLINICAL DATA:  Lower abdominal pain. Sepsis. Left groin cellulitis. EXAM: CT CHEST, ABDOMEN AND PELVIS WITHOUT CONTRAST TECHNIQUE: Multidetector CT imaging of the chest, abdomen and pelvis was performed following the standard protocol without IV contrast. COMPARISON:  Pelvis CT on 05/15/2019, and AP CT on 05/14/2019 FINDINGS: CT CHEST FINDINGS Cardiovascular: No acute findings. Aortic and coronary artery atherosclerosis noted. Mediastinum/Lymph Nodes: No masses or pathologically enlarged lymph nodes identified on this unenhanced exam. Lungs/Pleura: Mild scarring is seen predominately involving the right upper lobe. No evidence of pulmonary infiltrate or pleural effusion. A 5 mm pulmonary nodule is seen in the right lung apex on image 23/3. Musculoskeletal: No suspicious bone lesions identified. CT ABDOMEN AND PELVIS FINDINGS Hepatobiliary: No masses visualized on this unenhanced exam. Gallbladder is unremarkable. No evidence of biliary ductal dilatation. Pancreas: No mass or inflammatory changes identified on this unenhanced exam. Spleen: Within normal limits in size. Adrenals/Urinary Tract: No evidence of urolithiasis or  hydronephrosis. Unremarkable appearance of bladder. Stomach/Bowel: No evidence of obstruction, inflammatory process, or abnormal fluid collections. Vascular/Lymphatic: No pathologically enlarged lymph nodes identified. No abdominal aortic aneurysm. Aortic atherosclerosis noted. Reproductive:  No masses or other significant abnormality. Other:  None. Musculoskeletal: No suspicious bone lesions identified. Increased edema and new subcutaneous emphysema is seen throughout the subcutaneous tissues of the left hip and proximal thigh compared to recent exams. This is highly suspicious for necrotizing fasciitis. IMPRESSION: 1. Increased edema and new emphysema throughout the subcutaneous tissues of left hip and proximal thigh, highly suspicious for necrotizing fasciitis. 2. 5 mm indeterminate right upper lobe pulmonary nodule. No follow-up needed if patient is low-risk. Non-contrast chest CT can be considered in 12 months if patient is high-risk. This recommendation follows the consensus statement: Guidelines for Management of Incidental Pulmonary Nodules Detected on CT Images: From the Fleischner Society 2017; Radiology 2017; 284:228-243. Critical Value/emergent results were called by telephone at the time of interpretation on 05/18/2019 at 6:39 pm to provider DAVID TAT , who verbally acknowledged these results. Aortic Atherosclerosis (ICD10-I70.0). Electronically Signed   By: Marlaine Hind M.D.   On: 05/18/2019 18:52   CT Abdomen Pelvis Wo Contrast  Result Date: 05/14/2019 CLINICAL DATA:  Soft tissue infection suspected, pelvis. EXAM: CT ABDOMEN AND PELVIS WITHOUT CONTRAST TECHNIQUE: Multidetector CT imaging of the abdomen and pelvis was performed following the standard protocol without IV contrast. COMPARISON:  CTA of the abdomen and pelvis /5/20. CT stone study 11/24/2016. FINDINGS: Lower chest: Areas of dependent airspace disease are present in the medial lower lobes bilaterally, left greater than right.  Atherosclerotic calcifications present the coronary arteries. Heart size is normal. Hepatobiliary: Gallbladder is mostly collapsed. Liver is unremarkable. Common bile duct is within normal limits. Pancreas: Unremarkable. No pancreatic ductal dilatation or surrounding inflammatory changes. Spleen: Normal in size without focal abnormality. Adrenals/Urinary Tract: Adrenal  glands are normal bilaterally. There is some stranding about both kidneys. No obstruction is present. No mass lesion or stone is evident. Atherosclerotic changes are present. Ureters are within normal limits. Urinary bladder is unremarkable. Stomach/Bowel: Stomach and duodenum are within normal limits. Small bowel is unremarkable. Terminal ileum is normal. Ascending transverse colon are normal. Descending and sigmoid colon are normal. Vascular/Lymphatic: Extensive atherosclerotic calcifications are again noted. Left iliac stent is in place. Aneurysmal dilation is present. 7 and 8 mm left inguinal lymph nodes are slightly more prominent than the prior exam. Left femoral lymph nodes have increased in size, likely reactive. Reproductive: Prostate calcifications are present without significant large mint or focal lesion. Other: Skin thickening and extensive subcutaneous edematous changes are present the medial upper left thigh extending into the inguinal region. No discrete abscess or free air is present. No superior extension is present. A well-defined subcutaneous low-density collection along the medial left gluteal cleft measures 8.3 x 7.6 x 25.6 mm. No significant stranding is associated. Musculoskeletal: Progressive disc disease is present at L5-S1 with central and bilateral foraminal stenosis. Vacuum disc is now present. Broad-based disc protrusion is also present at L3-4 and L4-5. IMPRESSION: 1. Extensive inflammatory changes the left inguinal region extending into the anterior and medial upper thigh compatible with acute cellulitis. No associated  abscess is present. 2. Associated reactive adenopathy. 3. Ascending inflammatory changes. 4. Low-density left perirectal fluid collection measuring up to 25 mm. No associated inflammatory changes are present. This may represent an early or healing perirectal abscess. The area should be amenable to direct visualization. 5. Progressive lower lumbar disc disease. Electronically Signed   By: San Morelle M.D.   On: 05/14/2019 14:29   CT HEAD WO CONTRAST  Result Date: 05/14/2019 CLINICAL DATA:  Encephalopathy, seizures EXAM: CT HEAD WITHOUT CONTRAST TECHNIQUE: Contiguous axial images were obtained from the base of the skull through the vertex without intravenous contrast. COMPARISON:  10/15/2018 FINDINGS: Brain: There is new, age-indeterminate cortical based hypodensity of the posterior right occipital lobe (series 2, image 13). Extensive periventricular and deep white matter hypodensity. Vascular: No hyperdense vessel or unexpected calcification. Skull: Normal. Negative for fracture or focal lesion. Sinuses/Orbits: No acute finding. Other: None. IMPRESSION: 1. There is new cortical based hypodensity of the posterior right occipital lobe, consistent with infarction although age indeterminate, by appearance subacute to chronic. Consider MRI to further evaluate for acute diffusion restricting infarction. 2.  Small-vessel white matter disease. Electronically Signed   By: Eddie Candle M.D.   On: 05/14/2019 17:45   CT CHEST WO CONTRAST  Result Date: 05/18/2019 CLINICAL DATA:  Lower abdominal pain. Sepsis. Left groin cellulitis. EXAM: CT CHEST, ABDOMEN AND PELVIS WITHOUT CONTRAST TECHNIQUE: Multidetector CT imaging of the chest, abdomen and pelvis was performed following the standard protocol without IV contrast. COMPARISON:  Pelvis CT on 05/15/2019, and AP CT on 05/14/2019 FINDINGS: CT CHEST FINDINGS Cardiovascular: No acute findings. Aortic and coronary artery atherosclerosis noted. Mediastinum/Lymph Nodes: No  masses or pathologically enlarged lymph nodes identified on this unenhanced exam. Lungs/Pleura: Mild scarring is seen predominately involving the right upper lobe. No evidence of pulmonary infiltrate or pleural effusion. A 5 mm pulmonary nodule is seen in the right lung apex on image 23/3. Musculoskeletal: No suspicious bone lesions identified. CT ABDOMEN AND PELVIS FINDINGS Hepatobiliary: No masses visualized on this unenhanced exam. Gallbladder is unremarkable. No evidence of biliary ductal dilatation. Pancreas: No mass or inflammatory changes identified on this unenhanced exam. Spleen: Within normal limits in size.  Adrenals/Urinary Tract: No evidence of urolithiasis or hydronephrosis. Unremarkable appearance of bladder. Stomach/Bowel: No evidence of obstruction, inflammatory process, or abnormal fluid collections. Vascular/Lymphatic: No pathologically enlarged lymph nodes identified. No abdominal aortic aneurysm. Aortic atherosclerosis noted. Reproductive:  No masses or other significant abnormality. Other:  None. Musculoskeletal: No suspicious bone lesions identified. Increased edema and new subcutaneous emphysema is seen throughout the subcutaneous tissues of the left hip and proximal thigh compared to recent exams. This is highly suspicious for necrotizing fasciitis. IMPRESSION: 1. Increased edema and new emphysema throughout the subcutaneous tissues of left hip and proximal thigh, highly suspicious for necrotizing fasciitis. 2. 5 mm indeterminate right upper lobe pulmonary nodule. No follow-up needed if patient is low-risk. Non-contrast chest CT can be considered in 12 months if patient is high-risk. This recommendation follows the consensus statement: Guidelines for Management of Incidental Pulmonary Nodules Detected on CT Images: From the Fleischner Society 2017; Radiology 2017; 284:228-243. Critical Value/emergent results were called by telephone at the time of interpretation on 05/18/2019 at 6:39 pm to  provider DAVID TAT , who verbally acknowledged these results. Aortic Atherosclerosis (ICD10-I70.0). Electronically Signed   By: Marlaine Hind M.D.   On: 05/18/2019 18:52   CT PELVIS WO CONTRAST  Result Date: 05/15/2019 CLINICAL DATA:  Myositis worrisome for infection. Possible necrotizing infection left groin/hip region. EXAM: CT PELVIS WITHOUT CONTRAST TECHNIQUE: Multidetector CT imaging of the pelvis was performed following the standard protocol without intravenous contrast. COMPARISON:  CT 05/14/2019 FINDINGS: Urinary Tract: Visualized ureters and bladder are normal. Visualized lower poles of the kidneys unchanged. Bowel:  Visualized small bowel and colon are unremarkable. Vascular/Lymphatic: Calcified plaque over the abdominal aorta which is otherwise normal in caliber. Slight increased number of small left superficial inguinal lymph nodes which are unchanged and likely reactive. No change in a few small left external iliac lymph nodes. No other adenopathy. Reproductive:  Normal. Other: Stable subcutaneous edema over the soft tissues of the left mid to lower inguinal region extending towards the medial upper thigh just superficial to the abductor muscle group. Mild focal fluid over the medial upper thigh unchanged, although no definite subcutaneous abscess. These findings are unchanged and compatible rib patient's known soft tissue infection. No evidence of air within the soft tissues. No evidence of involvement of the underlying muscle compartment. No extension to the perianal/perirectal space. Musculoskeletal: Unchanged. IMPRESSION: 1. Subcutaneous edema and soft tissue changes over the mid to lower left inguinal region which are unchanged and compatible with patient's known soft tissue infection. No evidence of air in the soft tissues. No definite subcutaneous abscess. 2.  Aortic Atherosclerosis (ICD10-I70.0). Electronically Signed   By: Marin Olp M.D.   On: 05/15/2019 12:09   MR BRAIN WO  CONTRAST  Result Date: 05/15/2019 CLINICAL DATA:  Abnormal CT brain, seizure, encephalopathy. Additional provided: Weakness and confusion. EXAM: MRI HEAD WITHOUT CONTRAST TECHNIQUE: Multiplanar, multiecho pulse sequences of the brain and surrounding structures were obtained without intravenous contrast. COMPARISON:  Noncontrast head CT 05/14/2019, head CT 10/15/2018 FINDINGS: Brain: The patient was unable to tolerate the full examination. Only a sagittal T1 weighted sequence, coronal diffusion-weighted sequence, axial diffusion-weighted sequence, axial T2 weighted sequence, axial T2/FLAIR sequence and axial T2* sequence could be obtained. Multiple sequences are significantly motion degraded. Most notably, there is moderate motion degradation of the sagittal T1 weighted sequence, moderate/severe motion degradation of the coronal diffusion-weighted sequence, moderate/severe motion degradation of the axial T2 weighted sequence, severe motion degradation of the axial T2/FLAIR sequence and severe motion  degradation of the axial T2* sequence. This significantly limits evaluation. There is an incompletely assessed focus of T2/FLAIR hyperintensity within the right occipital lobe. Suggestion of volume loss at this site on axial T2 weighted imaging. There is some diffusion-weighted hyperintensity at this site without definite ADC map correlate (series 7, image 19). Incompletely assessed chronic small vessel ischemic disease with chronic lacunar infarcts in the bilateral cerebral white matter, basal ganglia, thalami and cerebellar hemispheres. There is no midline shift. No extra-axial fluid collection is identified. Severe motion degradation of the axial T2* sequence precludes adequate evaluation for chronic intracranial blood products. Mild generalized parenchymal atrophy. Vascular: Expected proximal arterial flow voids. Skull and upper cervical spine: No focal marrow lesion is identified within the limitations of motion  degradation. Sinuses/Orbits: Visualized orbits show no acute finding. Mild ethmoid sinus mucosal thickening. Small left maxillary sinus mucous retention cysts. No significant mastoid effusion. IMPRESSION: Significantly motion degraded, prematurely terminated and limited examination as described. Incompletely assessed focus of T2/FLAIR hyperintensity within the right occipital lobe with suggestion of volume loss at this site. There is some diffusion-weighted hyperintensity at this site, although the without definite ADC correlate. Overall, the appearance on today's exam and on prior head CT are suggestive of chronic infarct. However, given significant motion degradation, a repeat noncontrast MRI is recommended for confirmation when the patient is better able to tolerate the study. Chronic small vessel ischemic disease with multiple chronic lacunar infarcts as described. Mild generalized parenchymal atrophy. Electronically Signed   By: Kellie Simmering DO   On: 05/15/2019 14:13   DG Chest Port 1 View  Result Date: 05/14/2019 CLINICAL DATA:  Fever.  Overdose. EXAM: PORTABLE CHEST 1 VIEW COMPARISON:  03/10/2019. FINDINGS: Cardiomegaly. Bilateral mild interstitial prominence. Tiny bilateral pleural effusions can not be excluded. Mild CHF cannot be excluded. No pneumothorax. Degenerative change thoracic spine. IMPRESSION: Cardiomegaly. Mild bilateral interstitial prominence. Tiny bilateral pleural effusions can not be excluded. Mild CHF cannot be excluded. Electronically Signed   By: Marcello Moores  Register   On: 05/14/2019 12:23   EEG adult  Result Date: 05/15/2019 Lora Havens, MD     05/16/2019  9:20 AM Patient Name: DAVON BOWE MRN: [id number] Epilepsy Attending: Lora Havens Referring Physician/Provider: Dr. [name] Tat Date: 05/15/2019 Duration: 24.03 mins Patient history: 64 year old male with altered mental status.  EEG evaluate for seizures. Level of alertness: Awake, sleep AEDs during EEG study: None Technical  aspects: This EEG study was done with scalp electrodes positioned according to the 10-20 International system of electrode placement. Electrical activity was acquired at a sampling rate of 500Hz  and reviewed with a high frequency filter of 70Hz  and a low frequency filter of 1Hz . EEG data were recorded continuously and digitally stored. Description: The posterior dominant rhythm consists of 8 Hz activity of moderate voltage (25-35 uV) seen predominantly in posterior head regions, symmetric and reactive to eye opening and eye closing.  Sleep was characterized by vertex waves, sleep spindles (12 to 14 Hz), maximal frontocentral region.  Intermittent generalized 3 to 5 Hz theta-delta slowing was also noted.  Hyperventilation and photic stimulation were not performed. Abnormality -Intermittent slow, generalized IMPRESSION: This study is suggestive of mild diffuse encephalopathy, nonspecific to etiology. No seizures or epileptiform discharges were seen throughout the recording. Lora Havens     The results of significant diagnostics from this hospitalization (including imaging, microbiology, ancillary and laboratory) are listed below for reference.     Microbiology: Recent Results (from the past 240 hour(s))  Aerobic/Anaerobic Culture (surgical/deep wound)     Status: None   Collection Time: 05/19/19  1:15 AM   Specimen: PATH Other; Abscess  Result Value Ref Range Status   Specimen Description ABSCESS LEFT GROIN  Final   Special Requests NONE  Final   Gram Stain   Final    FEW WBC PRESENT, PREDOMINANTLY PMN ABUNDANT GRAM NEGATIVE RODS FEW GRAM POSITIVE COCCI FEW GRAM POSITIVE RODS Performed at [facility], [place] [address] [place], [place] [address]    Culture   Final    MIXED ANAEROBIC FLORA PRESENT.  CALL LAB IF FURTHER IID REQUIRED.   Report Status 05/21/2019 FINAL  Final  SARS Coronavirus 2 by RT PCR (hospital order, performed in [facility] hospital lab) Nasopharyngeal Nasopharyngeal  Swab     Status: None   Collection Time: 05/24/19 11:41 AM   Specimen: Nasopharyngeal Swab  Result Value Ref Range Status   SARS Coronavirus 2 NEGATIVE NEGATIVE Final    Comment: (NOTE) SARS-CoV-2 target nucleic acids are NOT DETECTED. The SARS-CoV-2 RNA is generally detectable in upper and lower respiratory specimens during the acute phase of infection. The lowest concentration of SARS-CoV-2 viral copies this assay can detect is 250 copies / mL. A negative result does not preclude SARS-CoV-2 infection and should not be used as the sole basis for treatment or other patient management decisions.  A negative result may occur with improper specimen collection / handling, submission of specimen other than nasopharyngeal swab, presence of viral mutation(s) within the areas targeted by this assay, and inadequate number of viral copies (<250 copies / mL). A negative result must be combined with clinical observations, patient history, and epidemiological information. Fact Sheet for Patients:   StrictlyIdeas.no Fact Sheet for Healthcare Providers: BankingDealers.co.za This test is not yet approved or cleared  by the [place] FDA and has been authorized for detection and/or diagnosis of SARS-CoV-2 by FDA under an Emergency Use Authorization (EUA).  This EUA will remain in effect (meaning this test can be used) for the duration of the COVID-19 declaration under Section 564(b)(1) of the Act, 21 U.S.C. section 360bbb-3(b)(1), unless the authorization is terminated or revoked sooner. Performed at [facility], [place] [address] [place],  [address]      Labs: BNP (last 3 results) Recent Labs    10/15/18 0153 10/21/18 2322  BNP 194.0* [id number]*   Basic Metabolic Panel: Recent Labs  Lab 05/21/19 0715 05/22/19 0224  NA 136 134*  K 3.4* 3.6  CL 103 102  CO2 24 25  GLUCOSE 182* 179*  BUN 18 16  CREATININE 1.34* 1.24  CALCIUM  7.5* 7.2*   Liver Function Tests: Recent Labs  Lab 05/21/19 0715 05/22/19 0224  AST 15 16  ALT 11 10  ALKPHOS 66 63  BILITOT 0.7 0.6  PROT 6.1* 6.0*  ALBUMIN 1.5* 1.3*   No results for input(s): LIPASE, AMYLASE in the last 168 hours. No results for input(s): AMMONIA in the last 168 hours. CBC: Recent Labs  Lab 05/22/19 0224 05/23/19 2047 05/24/19 0151 05/25/19 0912 05/27/19 0411  WBC 28.0* 19.9* 21.7* 17.5* 21.0*  HGB 9.5* 9.6* 10.0* 9.6* 9.4*  HCT 29.3* 29.8* 31.4* 30.5* 29.8*  MCV 80.3 81.4 82.0 81.8 83.7  PLT 354 392 433* 428* 497*   Cardiac Enzymes: No results for input(s): CKTOTAL, CKMB, CKMBINDEX, TROPONINI in the last 168 hours. BNP: Invalid input(s): POCBNP CBG: Recent Labs  Lab 05/26/19 1159 05/26/19 1639 05/26/19 2055 05/27/19 0815 05/27/19  0828  GLUCAP 177* 232* 270* 182* 203*   D-Dimer No results for input(s): DDIMER in the last 72 hours. Hgb A1c No results for input(s): HGBA1C in the last 72 hours. Lipid Profile No results for input(s): CHOL, HDL, LDLCALC, TRIG, CHOLHDL, LDLDIRECT in the last 72 hours. Thyroid function studies No results for input(s): TSH, T4TOTAL, T3FREE, THYROIDAB in the last 72 hours.  Invalid input(s): FREET3 Anemia work up No results for input(s): VITAMINB12, FOLATE, FERRITIN, TIBC, IRON, RETICCTPCT in the last 72 hours. Urinalysis    Component Value Date/Time   COLORURINE YELLOW 05/14/2019 1157   APPEARANCEUR HAZY (A) 05/14/2019 1157   LABSPEC 1.013 05/14/2019 1157   PHURINE 5.0 05/14/2019 1157   GLUCOSEU >=500 (A) 05/14/2019 1157   HGBUR MODERATE (A) 05/14/2019 1157   BILIRUBINUR NEGATIVE 05/14/2019 1157   KETONESUR NEGATIVE 05/14/2019 1157   PROTEINUR 30 (A) 05/14/2019 1157   UROBILINOGEN 0.2 09/27/2014 2237   NITRITE NEGATIVE 05/14/2019 1157   LEUKOCYTESUR NEGATIVE 05/14/2019 1157   Sepsis Labs Invalid input(s): PROCALCITONIN,  WBC,  LACTICIDVEN Microbiology Recent Results (from the past 240 hour(s))   Aerobic/Anaerobic Culture (surgical/deep wound)     Status: None   Collection Time: 05/19/19  1:15 AM   Specimen: PATH Other; Abscess  Result Value Ref Range Status   Specimen Description ABSCESS LEFT GROIN  Final   Special Requests NONE  Final   Gram Stain   Final    FEW WBC PRESENT, PREDOMINANTLY PMN ABUNDANT GRAM NEGATIVE RODS FEW GRAM POSITIVE COCCI FEW GRAM POSITIVE RODS Performed at [facility], [place] [address], [place], [place] [address]    Culture   Final    MIXED ANAEROBIC FLORA PRESENT.  CALL LAB IF FURTHER IID REQUIRED.   Report Status 05/21/2019 FINAL  Final  SARS Coronavirus 2 by RT PCR (hospital order, performed in [facility] hospital lab) Nasopharyngeal Nasopharyngeal Swab     Status: None   Collection Time: 05/24/19 11:41 AM   Specimen: Nasopharyngeal Swab  Result Value Ref Range Status   SARS Coronavirus 2 NEGATIVE NEGATIVE Final    Comment: (NOTE) SARS-CoV-2 target nucleic acids are NOT DETECTED. The SARS-CoV-2 RNA is generally detectable in upper and lower respiratory specimens during the acute phase of infection. The lowest concentration of SARS-CoV-2 viral copies this assay can detect is 250 copies / mL. A negative result does not preclude SARS-CoV-2 infection and should not be used as the sole basis for treatment or other patient management decisions.  A negative result may occur with improper specimen collection / handling, submission of specimen other than nasopharyngeal swab, presence of viral mutation(s) within the areas targeted by this assay, and inadequate number of viral copies (<250 copies / mL). A negative result must be combined with clinical observations, patient history, and epidemiological information. Fact Sheet for Patients:   StrictlyIdeas.no Fact Sheet for Healthcare Providers: BankingDealers.co.za This test is not yet approved or cleared  by the [place] FDA and has been  authorized for detection and/or diagnosis of SARS-CoV-2 by FDA under an Emergency Use Authorization (EUA).  This EUA will remain in effect (meaning this test can be used) for the duration of the COVID-19 declaration under Section 564(b)(1) of the Act, 21 U.S.C. section 360bbb-3(b)(1), unless the authorization is terminated or revoked sooner. Performed at [facility], [place] [address], [place],  [address]      Time coordinating discharge in minutes: 65  SIGNED:   Debbe Odea, MD  [facility] 05/27/2019,  10:57 AM

## 2019-05-25 LAB — GLUCOSE, CAPILLARY
Glucose-Capillary: 199 mg/dL — ABNORMAL HIGH (ref 70–99)
Glucose-Capillary: 235 mg/dL — ABNORMAL HIGH (ref 70–99)
Glucose-Capillary: 244 mg/dL — ABNORMAL HIGH (ref 70–99)
Glucose-Capillary: 243 mg/dL — ABNORMAL HIGH (ref 70–99)

## 2019-05-25 LAB — CBC
HCT: 30.5 % — ABNORMAL LOW (ref 39.0–52.0)
Hemoglobin: 9.6 g/dL — ABNORMAL LOW (ref 13.0–17.0)
MCH: 25.7 pg — ABNORMAL LOW (ref 26.0–34.0)
MCHC: 31.5 g/dL (ref 30.0–36.0)
MCV: 81.8 fL (ref 80.0–100.0)
Platelets: 428 10*3/uL — ABNORMAL HIGH (ref 150–400)
RBC: 3.73 MIL/uL — ABNORMAL LOW (ref 4.22–5.81)
RDW: 17.6 % — ABNORMAL HIGH (ref 11.5–15.5)
WBC: 17.5 10*3/uL — ABNORMAL HIGH (ref 4.0–10.5)
nRBC: 0.7 % — ABNORMAL HIGH (ref 0.0–0.2)

## 2019-05-25 MED ORDER — MORPHINE SULFATE (PF) 2 MG/ML IV SOLN
2.0000 mg | INTRAVENOUS | Status: DC | PRN
  Administered 2019-05-26 – 2019-05-27 (×4): 2 mg via INTRAVENOUS
  Filled 2019-05-25 (×4): qty 1

## 2019-05-25 NOTE — Progress Notes (Signed)
PROGRESS NOTE    [name]   [id number]  DOB: 1955/12/04  DOA: 05/14/2019 PCP: System, Pcp Not In   Brief Narrative:  [name] is a 64 y.o. male with medical history significant for CAD, DM, systolic CHF, CKD 3, hypertension, diabetes, cocaine use, OSA, peripheral vascular disease, apical mural thrombus. Patient was brought to the ED via EMS with reports of overdose on tramadol. Patient reported he had taken 3 doses of tramadol he was found in the bathtub, apparently he was soaking his whole left groin/perineal area.  Recent hospitalization 2/28 - 03/12/19 under cardiology service for NSTEMI, troponin peaking at 575, requiring PCI of RCA/PDA, via cardiac cath 03/11/19.  On dual antiplatelets Plavix and aspirin.  Echocardiogram also showed apical filling defect consistent with thrombus/early thrombus formation.  Plan was to continue aspirin and Plavix for 30 days, to discontinue aspirin then and add Xarelto and continue Plavix long-term. Echo showed EF of 30 to 35%, due to history of intermittent compliance, was felt not to be a candidate for ICD.   In ED > given 0.4 of Narcan mental status significantly improved.  Blood pressure dropped to 86/65 in the ED, improved after 3.5 L sepsis fluid bolus was given in ED.  Febrile to 102.6.  Initial tachycardia 104.  Lactic acid was significantly elevated at 6 >> 4.3.  Creatinine elevated at 2.95.   Abdominal/pelvic CT without contrast> extensive inflammatory changes in the left inguinal region extending into the anterior medial upper thigh compatible with acute cellulitis.  Admitted with severe sepsis, AKI and started on Vanc and Cefepime and IVF.  Subjective: He has no complaints today.   Assessment & Plan:   Principal Problem:   Necrotizing fasciitis, severe sepsis with AKI - 5/8- debridement - 5/10- repeat debridement - finalized wound culture> gram-negative rods and few gram-positive cocci and few gram-positive rods  -  currently on Zyvox, Flagyl and Cefepime- now on Augmentin - WBC 28 on 5/12- now 17.5  Active Problems: AKI- CKD 3b- baseline CR ~ 1.5-1.6 - Cr 2.95 on admission - Cr improved to 1.24   Severe hypoalbuminemia - Alb ~ 1 now- due to above illness -cont Prostat     Cardiomyopathy, ischemic  - last ECHO>  30 to 35%.  Grade 1 d CHF - s/p cath - PCI of PDA and distal RCA - currently no fluid overload - not on diuretics   LV thrombus - Apical filling defect noted on ECHO on 3/1  - cont Xarelto     Cocaine abuse and tobacco abuse - cessation discussed    Uncontrolled type 2 diabetes mellitus with hyperglycemia - A1c 9.6 on 05/16/19  - home meds> Lantus, Jardiance, Metformin - cont Lantus and SSI     Opiate overdose  - Tramodol OD on admission reversed with Narcan   Time spent in minutes: 30 DVT prophylaxis: SCDs Code Status: Full code Family Communication:  Disposition Plan:  Status is: Inpatient  Remains inpatient appropriate because: awaiting SNF auth   Dispo: The patient is from: Home              Anticipated d/c is to: SNF              Anticipated d/c date is: 1-2 days              Patient currently is medically stable to d/c.  Consultants:   General surgery Procedures:    debridement x 2 Antimicrobials:  Anti-infectives (From admission, onward)  Start     Dose/Rate Route Frequency Ordered Stop   05/24/19 1400  metroNIDAZOLE (FLAGYL) tablet 500 mg  Status:  Discontinued     500 mg Oral Every 8 hours 05/24/19 0841 05/24/19 0944   05/24/19 1000  linezolid (ZYVOX) tablet 600 mg  Status:  Discontinued     600 mg Oral Every 12 hours 05/24/19 0841 05/24/19 0944   05/24/19 1000  amoxicillin-clavulanate (AUGMENTIN) 875-125 MG per tablet 1 tablet     1 tablet Oral Every 12 hours 05/24/19 0946     05/24/19 0000  amoxicillin-clavulanate (AUGMENTIN) 875-125 MG tablet     1 tablet Oral Every 12 hours 05/24/19 0910 06/07/19 2359   05/20/19 1200  linezolid (ZYVOX) IVPB 600  mg  Status:  Discontinued     600 mg 300 mL/hr over 60 Minutes Intravenous Every 12 hours 05/20/19 1101 05/24/19 0840   05/20/19 0500  vancomycin (VANCOCIN) IVPB 1000 mg/200 mL premix  Status:  Discontinued     1,000 mg 200 mL/hr over 60 Minutes Intravenous Every 12 hours 05/19/19 1533 05/20/19 1101   05/18/19 2000  metroNIDAZOLE (FLAGYL) IVPB 500 mg  Status:  Discontinued     500 mg 100 mL/hr over 60 Minutes Intravenous Every 8 hours 05/18/19 1913 05/24/19 0840   05/18/19 1200  ceFEPIme (MAXIPIME) 2 g in sodium chloride 0.9 % 100 mL IVPB  Status:  Discontinued     2 g 200 mL/hr over 30 Minutes Intravenous Every 8 hours 05/18/19 1013 05/24/19 0944   05/15/19 1500  vancomycin (VANCOREADY) IVPB 1250 mg/250 mL  Status:  Discontinued     1,250 mg 166.7 mL/hr over 90 Minutes Intravenous Every 24 hours 05/14/19 1447 05/19/19 1533   05/15/19 0000  ceFEPIme (MAXIPIME) 2 g in sodium chloride 0.9 % 100 mL IVPB  Status:  Discontinued     2 g 200 mL/hr over 30 Minutes Intravenous Every 12 hours 05/14/19 1424 05/18/19 1013   05/14/19 1300  vancomycin (VANCOREADY) IVPB 2000 mg/400 mL     2,000 mg 200 mL/hr over 120 Minutes Intravenous  Once 05/14/19 1223 05/14/19 1506   05/14/19 1200  ceFEPIme (MAXIPIME) 2 g in sodium chloride 0.9 % 100 mL IVPB     2 g 200 mL/hr over 30 Minutes Intravenous  Once 05/14/19 1158 05/14/19 1258   05/14/19 1200  vancomycin (VANCOCIN) IVPB 1000 mg/200 mL premix  Status:  Discontinued     1,000 mg 200 mL/hr over 60 Minutes Intravenous  Once 05/14/19 1158 05/14/19 1223       Objective: Vitals:   05/24/19 0500 05/24/19 1520 05/24/19 2142 05/25/19 0632  BP:  107/67 116/73 116/71  Pulse:  77 95 92  Resp:   (!) 22 20  Temp:  (!) 97.4 F (36.3 C) 98.2 F (36.8 C) (!) 97.4 F (36.3 C)  TempSrc:  Oral Oral Oral  SpO2:  97% 95% 97%  Weight: 102.8 kg     Height:        Intake/Output Summary (Last 24 hours) at 05/25/2019 1150 Last data filed at 05/25/2019 0959 Gross  per 24 hour  Intake 462 ml  Output 1900 ml  Net -1438 ml   Filed Weights   05/22/19 0500 05/23/19 0447 05/24/19 0500  Weight: 109 kg 103.9 kg 102.8 kg    Examination: General exam: Appears comfortable  HEENT: PERRLA, oral mucosa moist, no sclera icterus or thrush Respiratory system: Clear to auscultation. Respiratory effort normal. Cardiovascular system: S1 & S2 heard,  No murmurs  Gastrointestinal system: Abdomen soft, non-tender, nondistended. Normal bowel sounds   Central nervous system: Alert and oriented. No focal neurological deficits. Extremities: No cyanosis, clubbing or edema Skin: No rashes or ulcers- Left leg incision noted Psychiatry:  Mood & affect appropriate.     Data Reviewed: I have personally reviewed following labs and imaging studies  CBC: Recent Labs  Lab 05/21/19 0715 05/22/19 0224 05/23/19 2047 05/24/19 0151 05/25/19 0912  WBC 29.1* 28.0* 19.9* 21.7* 17.5*  HGB 10.5* 9.5* 9.6* 10.0* 9.6*  HCT 32.5* 29.3* 29.8* 31.4* 30.5*  MCV 81.5 80.3 81.4 82.0 81.8  PLT 337 354 392 433* [id number]*   Basic Metabolic Panel: Recent Labs  Lab 05/19/19 0629 05/20/19 0630 05/21/19 0715 05/22/19 0224  NA 134* 136 136 134*  K 4.0 3.2* 3.4* 3.6  CL 101 106 103 102  CO2 20* 23 24 25   GLUCOSE 143* 145* 182* 179*  BUN 29* 22 18 16   CREATININE 1.33* 1.31* 1.34* 1.24  CALCIUM 7.3* 6.8* 7.5* 7.2*  MG  --  2.0  --   --    GFR: Estimated Creatinine Clearance: 71.1 mL/min (by C-G formula based on SCr of 1.24 mg/dL). Liver Function Tests: Recent Labs  Lab 05/19/19 0629 05/20/19 0630 05/21/19 0715 05/22/19 0224  AST 21 13* 15 16  ALT 17 14 11 10   ALKPHOS 59 57 66 63  BILITOT 0.7 0.6 0.7 0.6  PROT 5.5* 5.3* 6.1* 6.0*  ALBUMIN 1.5* 1.3* 1.5* 1.3*   No results for input(s): LIPASE, AMYLASE in the last 168 hours. No results for input(s): AMMONIA in the last 168 hours. Coagulation Profile: No results for input(s): INR, PROTIME in the last 168 hours. Cardiac  Enzymes: No results for input(s): CKTOTAL, CKMB, CKMBINDEX, TROPONINI in the last 168 hours. BNP (last 3 results) No results for input(s): PROBNP in the last 8760 hours. HbA1C: No results for input(s): HGBA1C in the last 72 hours. CBG: Recent Labs  Lab 05/24/19 1156 05/24/19 1721 05/24/19 2146 05/25/19 0757 05/25/19 1141  GLUCAP 234* 101* 243* 199* 235*   Lipid Profile: No results for input(s): CHOL, HDL, LDLCALC, TRIG, CHOLHDL, LDLDIRECT in the last 72 hours. Thyroid Function Tests: No results for input(s): TSH, T4TOTAL, FREET4, T3FREE, THYROIDAB in the last 72 hours. Anemia Panel: No results for input(s): VITAMINB12, FOLATE, FERRITIN, TIBC, IRON, RETICCTPCT in the last 72 hours. Urine analysis:    Component Value Date/Time   COLORURINE YELLOW 05/14/2019 1157   APPEARANCEUR HAZY (A) 05/14/2019 1157   LABSPEC 1.013 05/14/2019 1157   PHURINE 5.0 05/14/2019 1157   GLUCOSEU >=500 (A) 05/14/2019 1157   HGBUR MODERATE (A) 05/14/2019 1157   BILIRUBINUR NEGATIVE 05/14/2019 1157   KETONESUR NEGATIVE 05/14/2019 1157   PROTEINUR 30 (A) 05/14/2019 1157   UROBILINOGEN 0.2 09/27/2014 2237   NITRITE NEGATIVE 05/14/2019 1157   LEUKOCYTESUR NEGATIVE 05/14/2019 1157   Sepsis Labs: @LABRCNTIP (procalcitonin:4,lacticidven:4) ) Recent Results (from the past 240 hour(s))  Culture, blood (Routine X 2) w Reflex to ID Panel     Status: None   Collection Time: 05/15/19  5:11 PM   Specimen: BLOOD RIGHT WRIST  Result Value Ref Range Status   Specimen Description BLOOD RIGHT WRIST  Final   Special Requests   Final    BOTTLES DRAWN AEROBIC AND ANAEROBIC Blood Culture adequate volume   Culture   Final    NO GROWTH 5 DAYS Performed at [facility], [address], [place], [place] [address]    Report Status 05/20/2019 FINAL  Final  Culture, blood (Routine X 2) w Reflex to ID Panel     Status: None   Collection Time: 05/15/19  5:11 PM   Specimen: BLOOD RIGHT ARM  Result Value Ref Range Status    Specimen Description BLOOD RIGHT ARM  Final   Special Requests   Final    BOTTLES DRAWN AEROBIC AND ANAEROBIC Blood Culture adequate volume   Culture   Final    NO GROWTH 5 DAYS Performed at [facility], [address], [place], [place] [address]    Report Status 05/20/2019 FINAL  Final  Aerobic/Anaerobic Culture (surgical/deep wound)     Status: None   Collection Time: 05/19/19  1:15 AM   Specimen: PATH Other; Abscess  Result Value Ref Range Status   Specimen Description ABSCESS LEFT GROIN  Final   Special Requests NONE  Final   Gram Stain   Final    FEW WBC PRESENT, PREDOMINANTLY PMN ABUNDANT GRAM NEGATIVE RODS FEW GRAM POSITIVE COCCI FEW GRAM POSITIVE RODS Performed at [facility], [place] [address] [place], [place] [address]    Culture   Final    MIXED ANAEROBIC FLORA PRESENT.  CALL LAB IF FURTHER IID REQUIRED.   Report Status 05/21/2019 FINAL  Final  SARS Coronavirus 2 by RT PCR (hospital order, performed in [facility] hospital lab) Nasopharyngeal Nasopharyngeal Swab     Status: None   Collection Time: 05/24/19 11:41 AM   Specimen: Nasopharyngeal Swab  Result Value Ref Range Status   SARS Coronavirus 2 NEGATIVE NEGATIVE Final    Comment: (NOTE) SARS-CoV-2 target nucleic acids are NOT DETECTED. The SARS-CoV-2 RNA is generally detectable in upper and lower respiratory specimens during the acute phase of infection. The lowest concentration of SARS-CoV-2 viral copies this assay can detect is 250 copies / mL. A negative result does not preclude SARS-CoV-2 infection and should not be used as the sole basis for treatment or other patient management decisions.  A negative result may occur with improper specimen collection / handling, submission of specimen other than nasopharyngeal swab, presence of viral mutation(s) within the areas targeted by this assay, and inadequate number of viral copies (<250 copies / mL). A negative result must be combined with  clinical observations, patient history, and epidemiological information. Fact Sheet for Patients:   StrictlyIdeas.no Fact Sheet for Healthcare Providers: BankingDealers.co.za This test is not yet approved or cleared  by the [place] FDA and has been authorized for detection and/or diagnosis of SARS-CoV-2 by FDA under an Emergency Use Authorization (EUA).  This EUA will remain in effect (meaning this test can be used) for the duration of the COVID-19 declaration under Section 564(b)(1) of the Act, 21 U.S.C. section 360bbb-3(b)(1), unless the authorization is terminated or revoked sooner. Performed at [facility], [place] [address], [place], [place] [address]          Radiology Studies: No results found.    Scheduled Meds: . amoxicillin-clavulanate  1 tablet Oral Q12H  . aspirin  81 mg Oral Daily  . Chlorhexidine Gluconate Cloth  6 each Topical Daily  . docusate sodium  100 mg Oral BID  . feeding supplement (PRO-STAT SUGAR FREE 64)  30 mL Oral TID  . insulin aspart  0-15 Units Subcutaneous TID WC  . insulin aspart  0-5 Units Subcutaneous QHS  . insulin glargine  14 Units Subcutaneous Daily  . multivitamin with minerals  1 tablet Oral Daily  . Muscle Rub   Topical QID  . polyethylene  glycol  17 g Oral Daily  . rivaroxaban  20 mg Oral Q breakfast   Continuous Infusions: . sodium chloride 250 mL (05/18/19 1158)     LOS: 11 days      Debbe Odea, MD [facility] Pager: [email or web address] 05/25/2019, 11:50 AM

## 2019-05-25 NOTE — Social Work (Signed)
CSW contacted patient's daughter to inform her insurance authorization has not yet been received.   [employer], LCSWA

## 2019-05-25 NOTE — TOC Progression Note (Signed)
Transition of Care [facility]) - Progression Note    Patient Details  Name: [name] MRN: [id number] Date of Birth: [birth year]  Transition of Care [facility]) CM/SW [place], [place] Phone Number: 05/25/2019, 10:46 AM  Clinical Narrative:    Authorization is still pending, CSW has called to check on it with [facility] Medicare. MD messaged with this update.   Expected Discharge Plan: Skilled Nursing Facility Barriers to Discharge: Continued Medical Work up, Ship broker  Expected Discharge Plan and Services Expected Discharge Plan: [place] In-house Referral: Clinical Social Work Discharge Planning Services: CM Consult Post Acute Care Choice: [place] arrangements for the past 2 months: Single Family Home Expected Discharge Date: 05/24/19                 Readmission Risk Interventions Readmission Risk Prevention Plan 05/21/2019  Transportation Screening Complete  PCP or Specialist Appt within 3-5 Days Not Complete  Not Complete comments rec for SNF  [place] or [place] Complete  Social Work Consult for [place] Planning/Counseling Complete  Palliative Care Screening Not Applicable  Medication Review [employer]) Referral to Pharmacy  PCP or Specialist appointment within 3-5 days of discharge Not Complete  PCP/Specialist Appt Not Complete comments plan for d/c to SNF  [place] or [place] Complete  SW Recovery Care/Counseling Consult Complete  [place] Not Applicable  Some recent data might be hidden

## 2019-05-26 LAB — GLUCOSE, CAPILLARY
Glucose-Capillary: 240 mg/dL — ABNORMAL HIGH (ref 70–99)
Glucose-Capillary: 177 mg/dL — ABNORMAL HIGH (ref 70–99)
Glucose-Capillary: 232 mg/dL — ABNORMAL HIGH (ref 70–99)
Glucose-Capillary: 270 mg/dL — ABNORMAL HIGH (ref 70–99)

## 2019-05-26 MED ORDER — METFORMIN HCL 500 MG PO TABS
1000.0000 mg | ORAL_TABLET | Freq: Two times a day (BID) | ORAL | Status: DC
  Administered 2019-05-26 – 2019-05-27 (×3): 1000 mg via ORAL
  Filled 2019-05-26 (×3): qty 2

## 2019-05-26 MED ORDER — INSULIN GLARGINE 100 UNIT/ML ~~LOC~~ SOLN
18.0000 [IU] | Freq: Every day | SUBCUTANEOUS | Status: DC
  Administered 2019-05-27: 18 [IU] via SUBCUTANEOUS
  Filled 2019-05-26 (×2): qty 0.18

## 2019-05-26 NOTE — Progress Notes (Addendum)
PROGRESS NOTE    [name]   [id number]  DOB: 15-May-1955  DOA: 05/14/2019 PCP: System, Pcp Not In   Brief Narrative:  [name] is a 64 y.o. male with medical history significant for CAD, DM, systolic CHF, CKD 3, hypertension, diabetes, cocaine use, OSA, peripheral vascular disease, apical mural thrombus. Patient was brought to the ED via EMS with reports of overdose on tramadol. Patient reported he had taken 3 doses of tramadol he was found in the bathtub, apparently he was soaking his whole left groin/perineal area.  Recent hospitalization 2/28 - 03/12/19 under cardiology service for NSTEMI, troponin peaking at 575, requiring PCI of RCA/PDA, via cardiac cath 03/11/19.  On dual antiplatelets Plavix and aspirin.  Echocardiogram also showed apical filling defect consistent with thrombus/early thrombus formation.  Plan was to continue aspirin and Plavix for 30 days, to discontinue aspirin then and add Xarelto and continue Plavix long-term. Echo showed EF of 30 to 35%, due to history of intermittent compliance, was felt not to be a candidate for ICD.   In ED > given 0.4 of Narcan mental status significantly improved.  Blood pressure dropped to 86/65 in the ED, improved after 3.5 L sepsis fluid bolus was given in ED.  Febrile to 102.6.  Initial tachycardia 104.  Lactic acid was significantly elevated at 6 >> 4.3.  Creatinine elevated at 2.95.   Abdominal/pelvic CT without contrast> extensive inflammatory changes in the left inguinal region extending into the anterior medial upper thigh compatible with acute cellulitis.  Admitted with severe sepsis, AKI and started on Vanc and Cefepime and IVF.  Subjective: He has no complaints.  Assessment & Plan:   Principal Problem:   Necrotizing fasciitis, severe sepsis with AKI - 5/8- debridement - 5/10- repeat debridement - finalized wound culture> gram-negative rods and few gram-positive cocci and few gram-positive rods  - currently on  Zyvox, Flagyl and Cefepime- now on Augmentin - WBC 28 on 5/12- now 17.5- wound is clean- dressing has a small amount of blood on it- check CBC tomorrow  Active Problems: AKI- CKD 3b- baseline CR ~ 1.5-1.6 - Cr 2.95 on admission - Cr improved to 1.24   Severe hypoalbuminemia - Alb ~ 1 now- due to above illness -cont Prostat     Cardiomyopathy, ischemic  - last ECHO>  30 to 35%.  Grade 1 d CHF - s/p cath - PCI of PDA and distal RCA - currently no fluid overload - not on diuretics   LV thrombus - Apical filling defect noted on ECHO on 3/1  - cont Xarelto     Cocaine abuse and tobacco abuse - cessation discussed    Uncontrolled type 2 diabetes mellitus with hyperglycemia - A1c 9.6 on 05/16/19  - home meds> Lantus, Jardiance, Metformin - cont Lantus and SSI- sugars rising- will increase Lantus today and resume Metformin     Opiate overdose  - Tramodol OD on admission reversed with Narcan   Time spent in minutes: 30 DVT prophylaxis: SCDs Code Status: Full code Family Communication:  Disposition Plan:  Status is: Inpatient  Remains inpatient appropriate because: still awaiting SNF auth   Dispo: The patient is from: Home              Anticipated d/c is to: SNF              Anticipated d/c date is: 1-2 days              Patient currently is medically  stable to d/c.  Consultants:   General surgery Procedures:    debridement x 2 Antimicrobials:  Anti-infectives (From admission, onward)   Start     Dose/Rate Route Frequency Ordered Stop   05/24/19 1400  metroNIDAZOLE (FLAGYL) tablet 500 mg  Status:  Discontinued     500 mg Oral Every 8 hours 05/24/19 0841 05/24/19 0944   05/24/19 1000  linezolid (ZYVOX) tablet 600 mg  Status:  Discontinued     600 mg Oral Every 12 hours 05/24/19 0841 05/24/19 0944   05/24/19 1000  amoxicillin-clavulanate (AUGMENTIN) 875-125 MG per tablet 1 tablet     1 tablet Oral Every 12 hours 05/24/19 0946     05/24/19 0000  amoxicillin-clavulanate  (AUGMENTIN) 875-125 MG tablet     1 tablet Oral Every 12 hours 05/24/19 0910 06/07/19 2359   05/20/19 1200  linezolid (ZYVOX) IVPB 600 mg  Status:  Discontinued     600 mg 300 mL/hr over 60 Minutes Intravenous Every 12 hours 05/20/19 1101 05/24/19 0840   05/20/19 0500  vancomycin (VANCOCIN) IVPB 1000 mg/200 mL premix  Status:  Discontinued     1,000 mg 200 mL/hr over 60 Minutes Intravenous Every 12 hours 05/19/19 1533 05/20/19 1101   05/18/19 2000  metroNIDAZOLE (FLAGYL) IVPB 500 mg  Status:  Discontinued     500 mg 100 mL/hr over 60 Minutes Intravenous Every 8 hours 05/18/19 1913 05/24/19 0840   05/18/19 1200  ceFEPIme (MAXIPIME) 2 g in sodium chloride 0.9 % 100 mL IVPB  Status:  Discontinued     2 g 200 mL/hr over 30 Minutes Intravenous Every 8 hours 05/18/19 1013 05/24/19 0944   05/15/19 1500  vancomycin (VANCOREADY) IVPB 1250 mg/250 mL  Status:  Discontinued     1,250 mg 166.7 mL/hr over 90 Minutes Intravenous Every 24 hours 05/14/19 1447 05/19/19 1533   05/15/19 0000  ceFEPIme (MAXIPIME) 2 g in sodium chloride 0.9 % 100 mL IVPB  Status:  Discontinued     2 g 200 mL/hr over 30 Minutes Intravenous Every 12 hours 05/14/19 1424 05/18/19 1013   05/14/19 1300  vancomycin (VANCOREADY) IVPB 2000 mg/400 mL     2,000 mg 200 mL/hr over 120 Minutes Intravenous  Once 05/14/19 1223 05/14/19 1506   05/14/19 1200  ceFEPIme (MAXIPIME) 2 g in sodium chloride 0.9 % 100 mL IVPB     2 g 200 mL/hr over 30 Minutes Intravenous  Once 05/14/19 1158 05/14/19 1258   05/14/19 1200  vancomycin (VANCOCIN) IVPB 1000 mg/200 mL premix  Status:  Discontinued     1,000 mg 200 mL/hr over 60 Minutes Intravenous  Once 05/14/19 1158 05/14/19 1223       Objective: Vitals:   05/25/19 1517 05/25/19 2125 05/26/19 0500 05/26/19 0542  BP: 120/72 112/74  119/67  Pulse: 91 91  99  Resp: 18 20  20   Temp: 98 F (36.7 C) 98.4 F (36.9 C)  98.5 F (36.9 C)  TempSrc: Oral Oral  Oral  SpO2: 99% 98%  94%  Weight:   108  kg   Height:        Intake/Output Summary (Last 24 hours) at 05/26/2019 1404 Last data filed at 05/26/2019 0600 Gross per 24 hour  Intake 1020 ml  Output 1400 ml  Net -380 ml   Filed Weights   05/23/19 0447 05/24/19 0500 05/26/19 0500  Weight: 103.9 kg 102.8 kg 108 kg    Examination: General exam: Appears comfortable  HEENT: PERRLA, oral mucosa moist, no  sclera icterus or thrush Respiratory system: Clear to auscultation. Respiratory effort normal. Cardiovascular system: S1 & S2 heard,  No murmurs  Gastrointestinal system: Abdomen soft, non-tender, nondistended. Normal bowel sounds   Central nervous system: Alert and oriented. No focal neurological deficits. Extremities: No cyanosis, clubbing or edema Skin: wound is clean- dressing has a small amount of blood on it- Psychiatry:  Mood & affect appropriate.    Data Reviewed: I have personally reviewed following labs and imaging studies  CBC: Recent Labs  Lab 05/21/19 0715 05/22/19 0224 05/23/19 2047 05/24/19 0151 05/25/19 0912  WBC 29.1* 28.0* 19.9* 21.7* 17.5*  HGB 10.5* 9.5* 9.6* 10.0* 9.6*  HCT 32.5* 29.3* 29.8* 31.4* 30.5*  MCV 81.5 80.3 81.4 82.0 81.8  PLT 337 354 392 433* [id number]*   Basic Metabolic Panel: Recent Labs  Lab 05/20/19 0630 05/21/19 0715 05/22/19 0224  NA 136 136 134*  K 3.2* 3.4* 3.6  CL 106 103 102  CO2 23 24 25   GLUCOSE 145* 182* 179*  BUN 22 18 16   CREATININE 1.31* 1.34* 1.24  CALCIUM 6.8* 7.5* 7.2*  MG 2.0  --   --    GFR: Estimated Creatinine Clearance: 72.9 mL/min (by C-G formula based on SCr of 1.24 mg/dL). Liver Function Tests: Recent Labs  Lab 05/20/19 0630 05/21/19 0715 05/22/19 0224  AST 13* 15 16  ALT 14 11 10   ALKPHOS 57 66 63  BILITOT 0.6 0.7 0.6  PROT 5.3* 6.1* 6.0*  ALBUMIN 1.3* 1.5* 1.3*   No results for input(s): LIPASE, AMYLASE in the last 168 hours. No results for input(s): AMMONIA in the last 168 hours. Coagulation Profile: No results for input(s): INR,  PROTIME in the last 168 hours. Cardiac Enzymes: No results for input(s): CKTOTAL, CKMB, CKMBINDEX, TROPONINI in the last 168 hours. BNP (last 3 results) No results for input(s): PROBNP in the last 8760 hours. HbA1C: No results for input(s): HGBA1C in the last 72 hours. CBG: Recent Labs  Lab 05/25/19 1141 05/25/19 1706 05/25/19 2206 05/26/19 0747 05/26/19 1159  GLUCAP 235* 244* 243* 240* 177*   Lipid Profile: No results for input(s): CHOL, HDL, LDLCALC, TRIG, CHOLHDL, LDLDIRECT in the last 72 hours. Thyroid Function Tests: No results for input(s): TSH, T4TOTAL, FREET4, T3FREE, THYROIDAB in the last 72 hours. Anemia Panel: No results for input(s): VITAMINB12, FOLATE, FERRITIN, TIBC, IRON, RETICCTPCT in the last 72 hours. Urine analysis:    Component Value Date/Time   COLORURINE YELLOW 05/14/2019 1157   APPEARANCEUR HAZY (A) 05/14/2019 1157   LABSPEC 1.013 05/14/2019 1157   PHURINE 5.0 05/14/2019 1157   GLUCOSEU >=500 (A) 05/14/2019 1157   HGBUR MODERATE (A) 05/14/2019 1157   BILIRUBINUR NEGATIVE 05/14/2019 1157   KETONESUR NEGATIVE 05/14/2019 1157   PROTEINUR 30 (A) 05/14/2019 1157   UROBILINOGEN 0.2 09/27/2014 2237   NITRITE NEGATIVE 05/14/2019 1157   LEUKOCYTESUR NEGATIVE 05/14/2019 1157   Sepsis Labs: @LABRCNTIP (procalcitonin:4,lacticidven:4) ) Recent Results (from the past 240 hour(s))  Aerobic/Anaerobic Culture (surgical/deep wound)     Status: None   Collection Time: 05/19/19  1:15 AM   Specimen: PATH Other; Abscess  Result Value Ref Range Status   Specimen Description ABSCESS LEFT GROIN  Final   Special Requests NONE  Final   Gram Stain   Final    FEW WBC PRESENT, PREDOMINANTLY PMN ABUNDANT GRAM NEGATIVE RODS FEW GRAM POSITIVE COCCI FEW GRAM POSITIVE RODS Performed at [facility], [place] [address], [place], [place] [address]    Culture   Final  MIXED ANAEROBIC FLORA PRESENT.  CALL LAB IF FURTHER IID REQUIRED.   Report Status 05/21/2019 FINAL   Final  SARS Coronavirus 2 by RT PCR (hospital order, performed in [facility] hospital lab) Nasopharyngeal Nasopharyngeal Swab     Status: None   Collection Time: 05/24/19 11:41 AM   Specimen: Nasopharyngeal Swab  Result Value Ref Range Status   SARS Coronavirus 2 NEGATIVE NEGATIVE Final    Comment: (NOTE) SARS-CoV-2 target nucleic acids are NOT DETECTED. The SARS-CoV-2 RNA is generally detectable in upper and lower respiratory specimens during the acute phase of infection. The lowest concentration of SARS-CoV-2 viral copies this assay can detect is 250 copies / mL. A negative result does not preclude SARS-CoV-2 infection and should not be used as the sole basis for treatment or other patient management decisions.  A negative result may occur with improper specimen collection / handling, submission of specimen other than nasopharyngeal swab, presence of viral mutation(s) within the areas targeted by this assay, and inadequate number of viral copies (<250 copies / mL). A negative result must be combined with clinical observations, patient history, and epidemiological information. Fact Sheet for Patients:   StrictlyIdeas.no Fact Sheet for Healthcare Providers: BankingDealers.co.za This test is not yet approved or cleared  by the [place] FDA and has been authorized for detection and/or diagnosis of SARS-CoV-2 by FDA under an Emergency Use Authorization (EUA).  This EUA will remain in effect (meaning this test can be used) for the duration of the COVID-19 declaration under Section 564(b)(1) of the Act, 21 U.S.C. section 360bbb-3(b)(1), unless the authorization is terminated or revoked sooner. Performed at [facility], [place] [address] [place], [place] [address]          Radiology Studies: No results found.    Scheduled Meds: . amoxicillin-clavulanate  1 tablet Oral Q12H  . aspirin  81 mg Oral Daily  . Chlorhexidine  Gluconate Cloth  6 each Topical Daily  . docusate sodium  100 mg Oral BID  . feeding supplement (PRO-STAT SUGAR FREE 64)  30 mL Oral TID  . insulin aspart  0-15 Units Subcutaneous TID WC  . insulin aspart  0-5 Units Subcutaneous QHS  . insulin glargine  14 Units Subcutaneous Daily  . multivitamin with minerals  1 tablet Oral Daily  . Muscle Rub   Topical QID  . polyethylene glycol  17 g Oral Daily  . rivaroxaban  20 mg Oral Q breakfast   Continuous Infusions: . sodium chloride 250 mL (05/18/19 1158)     LOS: 12 days      Debbe Odea, MD [facility] Pager: [email or web address] 05/26/2019, 2:04 PM

## 2019-05-26 NOTE — TOC Progression Note (Signed)
Transition of Care [facility]) - Progression Note    Patient Details  Name: [name] MRN: [id number] Date of Birth: 1955/10/17  Transition of Care [facility]) CM/SW Contact  [address], [place], [place] Phone Number: 05/26/2019, 2:58 PM  Clinical Narrative:     Follow up phone call to [facility] with the [facility] stating that they will be able to take patient tomorrow. Phone call to patient's daughter who confirmed that patient has not had his Covid vaccines but is willing to get it if available. Per patient's daughter, [name], she would like to be available by phone for an update on patient's transfer and to answer any questions that the facility may have. She will not be available until after 3 pm.   [place], LCSW Transitions of Care [phone]  Expected Discharge Plan: [place] Barriers to Discharge: Continued Medical Work up, Ship broker  Expected Discharge Plan and Services Expected Discharge Plan: [place] In-house Referral: Clinical Social Work Discharge Planning Services: CM Consult Post Acute Care Choice: [place] arrangements for the past 2 months: Single Family Home Expected Discharge Date: 05/24/19                                     Social Determinants of Health (SDOH) Interventions    Readmission Risk Interventions Readmission Risk Prevention Plan 05/21/2019  Transportation Screening Complete  PCP or Specialist Appt within 3-5 Days Not Complete  Not Complete comments rec for SNF  HRI or [place] Complete  Social Work Consult for [place] Planning/Counseling Complete  Palliative Care Screening Not Applicable  Medication Review Press photographer) Referral to Pharmacy  PCP or Specialist appointment within 3-5 days of discharge Not Complete  PCP/Specialist Appt Not Complete comments plan for d/c to SNF  [place] or [place] Complete  SW Recovery Care/Counseling  Consult Complete  [place] Not Applicable  Some recent data might be hidden

## 2019-05-26 NOTE — TOC Progression Note (Signed)
Transition of Care [facility])) - Progression Note    Patient Details  Name: [name] MRN: [id number] Date of Birth: October 25, 1955  Transition of Care [facility]) CM/SW Contact  [address], [place], [place] Phone Number: 05/26/2019, 11:55 AM  Clinical Narrative:     Authorization received from [place] for 4 days authorization identification  number [id number] 05/25/19-05/28/19 for SNF level of care for [facility]. Next review due on 05/28/19. [place] health care coordinator will reach out to confirm SNF admission.  Continued stay clinical to be faxed to [phone].   Phone call to [place] at the [facility] to confirm bed availability.  CSW awaiting a return call   Expected Discharge Plan: [place] Barriers to Discharge: Continued Medical Work up, Ship broker  Expected Discharge Plan and Services Expected Discharge Plan: [place] In-house Referral: Clinical Social Work Discharge Planning Services: CM Consult Post Acute Care Choice: [place] Living arrangements for the past 2 months: Single Family Home Expected Discharge Date: 05/24/19                                     Social Determinants of Health (SDOH) Interventions    Readmission Risk Interventions Readmission Risk Prevention Plan 05/21/2019  Transportation Screening Complete  PCP or Specialist Appt within 3-5 Days Not Complete  Not Complete comments rec for SNF  HRI or [place] Complete  Social Work Consult for [place] Planning/Counseling Complete  Palliative Care Screening Not Applicable  Medication Review Press photographer) Referral to Pharmacy  PCP or Specialist appointment within 3-5 days of discharge Not Complete  PCP/Specialist Appt Not Complete comments plan for d/c to SNF  [place] or [place] Complete  SW Recovery Care/Counseling Consult Complete  [place]  Not Applicable  Some recent data might be hidden

## 2019-05-27 DIAGNOSIS — R6520 Severe sepsis without septic shock: Secondary | ICD-10-CM | POA: Diagnosis not present

## 2019-05-27 DIAGNOSIS — Z79899 Other long term (current) drug therapy: Secondary | ICD-10-CM | POA: Diagnosis not present

## 2019-05-27 DIAGNOSIS — I10 Essential (primary) hypertension: Secondary | ICD-10-CM | POA: Diagnosis not present

## 2019-05-27 DIAGNOSIS — I6381 Other cerebral infarction due to occlusion or stenosis of small artery: Secondary | ICD-10-CM | POA: Diagnosis not present

## 2019-05-27 DIAGNOSIS — D649 Anemia, unspecified: Secondary | ICD-10-CM | POA: Diagnosis not present

## 2019-05-27 DIAGNOSIS — Z720 Tobacco use: Secondary | ICD-10-CM | POA: Diagnosis not present

## 2019-05-27 DIAGNOSIS — E1165 Type 2 diabetes mellitus with hyperglycemia: Secondary | ICD-10-CM | POA: Diagnosis not present

## 2019-05-27 DIAGNOSIS — K219 Gastro-esophageal reflux disease without esophagitis: Secondary | ICD-10-CM | POA: Diagnosis not present

## 2019-05-27 DIAGNOSIS — E1140 Type 2 diabetes mellitus with diabetic neuropathy, unspecified: Secondary | ICD-10-CM | POA: Diagnosis not present

## 2019-05-27 DIAGNOSIS — Z48817 Encounter for surgical aftercare following surgery on the skin and subcutaneous tissue: Secondary | ICD-10-CM | POA: Diagnosis not present

## 2019-05-27 DIAGNOSIS — I236 Thrombosis of atrium, auricular appendage, and ventricle as current complications following acute myocardial infarction: Secondary | ICD-10-CM | POA: Diagnosis not present

## 2019-05-27 DIAGNOSIS — I429 Cardiomyopathy, unspecified: Secondary | ICD-10-CM | POA: Diagnosis not present

## 2019-05-27 DIAGNOSIS — A419 Sepsis, unspecified organism: Secondary | ICD-10-CM | POA: Diagnosis not present

## 2019-05-27 DIAGNOSIS — I739 Peripheral vascular disease, unspecified: Secondary | ICD-10-CM | POA: Diagnosis not present

## 2019-05-27 DIAGNOSIS — I252 Old myocardial infarction: Secondary | ICD-10-CM | POA: Diagnosis not present

## 2019-05-27 DIAGNOSIS — Z741 Need for assistance with personal care: Secondary | ICD-10-CM | POA: Diagnosis not present

## 2019-05-27 DIAGNOSIS — E785 Hyperlipidemia, unspecified: Secondary | ICD-10-CM | POA: Diagnosis not present

## 2019-05-27 DIAGNOSIS — L03311 Cellulitis of abdominal wall: Secondary | ICD-10-CM | POA: Diagnosis not present

## 2019-05-27 DIAGNOSIS — R262 Difficulty in walking, not elsewhere classified: Secondary | ICD-10-CM | POA: Diagnosis not present

## 2019-05-27 DIAGNOSIS — M726 Necrotizing fasciitis: Secondary | ICD-10-CM | POA: Diagnosis not present

## 2019-05-27 DIAGNOSIS — M6281 Muscle weakness (generalized): Secondary | ICD-10-CM | POA: Diagnosis not present

## 2019-05-27 DIAGNOSIS — N179 Acute kidney failure, unspecified: Secondary | ICD-10-CM | POA: Diagnosis not present

## 2019-05-27 DIAGNOSIS — I255 Ischemic cardiomyopathy: Secondary | ICD-10-CM | POA: Diagnosis not present

## 2019-05-27 DIAGNOSIS — E119 Type 2 diabetes mellitus without complications: Secondary | ICD-10-CM | POA: Diagnosis not present

## 2019-05-27 DIAGNOSIS — E1122 Type 2 diabetes mellitus with diabetic chronic kidney disease: Secondary | ICD-10-CM | POA: Diagnosis not present

## 2019-05-27 DIAGNOSIS — E8809 Other disorders of plasma-protein metabolism, not elsewhere classified: Secondary | ICD-10-CM | POA: Diagnosis not present

## 2019-05-27 DIAGNOSIS — N183 Chronic kidney disease, stage 3 unspecified: Secondary | ICD-10-CM | POA: Diagnosis not present

## 2019-05-27 LAB — GLUCOSE, CAPILLARY
Glucose-Capillary: 182 mg/dL — ABNORMAL HIGH (ref 70–99)
Glucose-Capillary: 203 mg/dL — ABNORMAL HIGH (ref 70–99)
Glucose-Capillary: 182 mg/dL — ABNORMAL HIGH (ref 70–99)
Glucose-Capillary: 175 mg/dL — ABNORMAL HIGH (ref 70–99)

## 2019-05-27 LAB — CBC
HCT: 29.8 % — ABNORMAL LOW (ref 39.0–52.0)
Hemoglobin: 9.4 g/dL — ABNORMAL LOW (ref 13.0–17.0)
MCH: 26.4 pg (ref 26.0–34.0)
MCHC: 31.5 g/dL (ref 30.0–36.0)
MCV: 83.7 fL (ref 80.0–100.0)
Platelets: 497 10*3/uL — ABNORMAL HIGH (ref 150–400)
RBC: 3.56 MIL/uL — ABNORMAL LOW (ref 4.22–5.81)
RDW: 18 % — ABNORMAL HIGH (ref 11.5–15.5)
WBC: 21 10*3/uL — ABNORMAL HIGH (ref 4.0–10.5)
nRBC: 0.7 % — ABNORMAL HIGH (ref 0.0–0.2)

## 2019-05-27 MED ORDER — INSULIN GLARGINE 100 UNIT/ML ~~LOC~~ SOLN: 18.0000 [IU] | Freq: Every day | SUBCUTANEOUS | 11 refills | Status: DC

## 2019-05-27 NOTE — TOC Transition Note (Signed)
Transition of Care [facility]) - CM/SW Discharge Note   Patient Details  Name: [name] MRN: [id number] Date of Birth: 11-Dec-1955  Transition of Care [facility]) CM/SW Contact:  Alexander [place], LCSW Phone Number: 05/27/2019, 4:21 PM   Clinical Narrative:    CSW spoke with pt daughter via telephone, she is aware pt discharging today, PTAR called for 3pm. Pt signed script on chart along with PTAR papers. RN has called report.    Final next level of care: Skilled Nursing Facility Barriers to Discharge: Barriers Resolved   Patient Goals and CMS Choice Patient states their goals for this hospitalization and ongoing recovery are:: plan for rehab CMS Medicare.gov Compare Post Acute Care list provided to:: Patient Choice offered to / list presented to : Patient  Discharge Placement PASRR number recieved: 05/21/19            Patient [facility] bed at: [facility] Patient to be transferred to facility by: [place] Name of family member notified: pt daughter [name] Patient and family notified of of transfer: 05/27/19  Discharge Plan and Services In-house Referral: Clinical Social Work Discharge Planning Services: CM Consult Post Acute Care Choice: [place]         Readmission Risk Interventions Readmission Risk Prevention Plan 05/21/2019  Transportation Screening Complete  PCP or Specialist Appt within 3-5 Days Not Complete  Not Complete comments rec for SNF  HRI or [place] Complete  Social Work Consult for [place] Planning/Counseling Complete  Palliative Care Screening Not Applicable  Medication Review Press photographer) Referral to Pharmacy  PCP or Specialist appointment within 3-5 days of discharge Not Complete  PCP/Specialist Appt Not Complete comments plan for d/c to SNF  [place] or [place] Complete  SW Recovery Care/Counseling Consult Complete  [place] Not Applicable  Some  recent data might be hidden

## 2019-05-27 NOTE — Progress Notes (Signed)
[facility] Progress Note  7 Days Post-Op  Subjective: CC-  Continues to have a lot of pain with dressing changes.  WBC up 21, afebrile. Tolerating diet. Denies abdominal pain, n/v. He had at least 6 BMs yesterday, loose.  Objective: Vital signs in last 24 hours: Temp:  [98.5 F (36.9 C)-98.6 F (37 C)] 98.6 F (37 C) (05/17 0421) Pulse Rate:  [94-102] 100 (05/17 0421) Resp:  [18-20] 20 (05/17 0421) BP: (113-118)/(61-70) 118/62 (05/17 0421) SpO2:  [97 %-98 %] 98 % (05/17 0421) Weight:  [104.4 kg] 104.4 kg (05/17 0421) Last BM Date: 05/26/19  Intake/Output from previous day: 05/16 0701 - 05/17 0700 In: 620 [P.O.:620] Out: 751 [Urine:750; Stool:1] Intake/Output this shift: No intake/output data recorded.  PE: Gen:  Alert, NAD Pulm: rate and effort normal Abd: Soft, NT/ND Skin: left thigh/groin wound with mostly beefy red tissue, approximately 10% fibrinous exudate, no purulent drainage or cellulitis     Lab Results:  Recent Labs    05/25/19 0912 05/27/19 0411  WBC 17.5* 21.0*  HGB 9.6* 9.4*  HCT 30.5* 29.8*  PLT 428* 497*   BMET No results for input(s): NA, K, CL, CO2, GLUCOSE, BUN, CREATININE, CALCIUM in the last 72 hours. PT/INR No results for input(s): LABPROT, INR in the last 72 hours. CMP     Component Value Date/Time   NA 134 (L) 05/22/2019 0224   NA 141 11/28/2017 1208   K 3.6 05/22/2019 0224   CL 102 05/22/2019 0224   CO2 25 05/22/2019 0224   GLUCOSE 179 (H) 05/22/2019 0224   BUN 16 05/22/2019 0224   BUN 18 11/28/2017 1208   CREATININE 1.24 05/22/2019 0224   CALCIUM 7.2 (L) 05/22/2019 0224   PROT 6.0 (L) 05/22/2019 0224   ALBUMIN 1.3 (L) 05/22/2019 0224   AST 16 05/22/2019 0224   ALT 10 05/22/2019 0224   ALKPHOS 63 05/22/2019 0224   BILITOT 0.6 05/22/2019 0224   GFRNONAA >60 05/22/2019 0224   GFRAA >60 05/22/2019 0224   Lipase     Component Value Date/Time   LIPASE 40 03/10/2019 2208       Studies/Results: No  results found.  Anti-infectives: Anti-infectives (From admission, onward)   Start     Dose/Rate Route Frequency Ordered Stop   05/24/19 1400  metroNIDAZOLE (FLAGYL) tablet 500 mg  Status:  Discontinued     500 mg Oral Every 8 hours 05/24/19 0841 05/24/19 0944   05/24/19 1000  linezolid (ZYVOX) tablet 600 mg  Status:  Discontinued     600 mg Oral Every 12 hours 05/24/19 0841 05/24/19 0944   05/24/19 1000  amoxicillin-clavulanate (AUGMENTIN) 875-125 MG per tablet 1 tablet     1 tablet Oral Every 12 hours 05/24/19 0946     05/24/19 0000  amoxicillin-clavulanate (AUGMENTIN) 875-125 MG tablet     1 tablet Oral Every 12 hours 05/24/19 0910 06/07/19 2359   05/20/19 1200  linezolid (ZYVOX) IVPB 600 mg  Status:  Discontinued     600 mg 300 mL/hr over 60 Minutes Intravenous Every 12 hours 05/20/19 1101 05/24/19 0840   05/20/19 0500  vancomycin (VANCOCIN) IVPB 1000 mg/200 mL premix  Status:  Discontinued     1,000 mg 200 mL/hr over 60 Minutes Intravenous Every 12 hours 05/19/19 1533 05/20/19 1101   05/18/19 2000  metroNIDAZOLE (FLAGYL) IVPB 500 mg  Status:  Discontinued     500 mg 100 mL/hr over 60 Minutes Intravenous Every 8 hours 05/18/19 1913 05/24/19 0840  05/18/19 1200  ceFEPIme (MAXIPIME) 2 g in sodium chloride 0.9 % 100 mL IVPB  Status:  Discontinued     2 g 200 mL/hr over 30 Minutes Intravenous Every 8 hours 05/18/19 1013 05/24/19 0944   05/15/19 1500  vancomycin (VANCOREADY) IVPB 1250 mg/250 mL  Status:  Discontinued     1,250 mg 166.7 mL/hr over 90 Minutes Intravenous Every 24 hours 05/14/19 1447 05/19/19 1533   05/15/19 0000  ceFEPIme (MAXIPIME) 2 g in sodium chloride 0.9 % 100 mL IVPB  Status:  Discontinued     2 g 200 mL/hr over 30 Minutes Intravenous Every 12 hours 05/14/19 1424 05/18/19 1013   05/14/19 1300  vancomycin (VANCOREADY) IVPB 2000 mg/400 mL     2,000 mg 200 mL/hr over 120 Minutes Intravenous  Once 05/14/19 1223 05/14/19 1506   05/14/19 1200  ceFEPIme (MAXIPIME) 2 g  in sodium chloride 0.9 % 100 mL IVPB     2 g 200 mL/hr over 30 Minutes Intravenous  Once 05/14/19 1158 05/14/19 1258   05/14/19 1200  vancomycin (VANCOCIN) IVPB 1000 mg/200 mL premix  Status:  Discontinued     1,000 mg 200 mL/hr over 60 Minutes Intravenous  Once 05/14/19 1158 05/14/19 1223       Assessment/Plan OSA HTN PVD Hx left ventricular thrombus per notes on Xarelto at home CKD w/ AKI - improved Hx COPD Hx CHF DM2 CAD w/ recent NSTEMI (hospitalization 2/28 - 3/2) - On plavix at home  Necrotizing soft tissue infection left groin -S/pDebridement left groin 32cm x 6 cm x 4 cm deep including skin, subcutaneous fat, and fascia, Dr. [place] Silos, 5/9 - POD #8 -S/pdebridement of 15 x 10 cm skin, subcutaneous tissue with some fascial resection, Dr. Kieth Brightly, 5/10, POD #7 - Cont abx.Cultures with abundant gram-negative rods, few gram-positive cocci, few gram-positive rods. Mixed anaerobic. Discussed with ID, Dr. Tommy Medal, who recommends Augmentin when switched to Oral abx based on Cx report. Will need a total of 14 days. - WBC up to 21 today, afebrile. Does not appear that his wound is the source of leukocytosis. Continue BID wet to dry dressing changes.  FEN -HH/CM diet VTE -SCDs,xarelto ID -Vanc 5/4 - 5/10. Cefepime 5/4-5/14, Flagyl 5/8-5/14,Linezolid 5/10-5/14, augmentin 5/14>> Follow-Up - DOW   LOS: 13 days    Wellington [place], [facility] 05/27/2019, 8:16 AM Please see Amion for pager number during day hours 7:00am-4:30pm

## 2019-05-27 NOTE — Social Work (Addendum)
Clinical Social Worker facilitated patient discharge including contacting patient family and facility to confirm patient discharge plans.  Clinical information faxed to facility and family agreeable with plan.  CSW arranged ambulance transport via [facility] to [place] RN to call [phone]  with report prior to discharge.  Clinical Social Worker will sign off for now as social work intervention is no longer needed. Please consult [place] again if new need arises.  Westley Hummer, MSW, LCSW Clinical Social Worker

## 2019-05-27 NOTE — Progress Notes (Signed)
[name] to be D/C'd per MD order. Discussed with the patient and all questions fully answered. ? VSS, Skin clean, dry and intact without evidence of skin break down, no evidence of skin tears noted. ? IV catheter discontinued intact. Site without signs and symptoms of complications. Dressing and pressure applied. ? An After Visit Summary was printed and given to the patient. Patient informed where to pickup prescriptions. ? D/c education completed with receiving facility.? Patient instructed to return to ED, call 911, or call MD for any changes in condition.  ? Patient to be escorted via stretcher, and D/C to [facility] via [place].

## 2019-05-27 NOTE — Progress Notes (Signed)
Called report to [facility], spoke with Tiffany.

## 2019-05-27 NOTE — Progress Notes (Signed)
Physical Therapy Treatment Patient Details Name: [name] MRN: [id number] DOB: 1955/11/10 Today's Date: 05/27/2019    History of Present Illness Patient is a 64 y/o male admitted with AMS due to overdose of tramadol and found in bathtub.  Found to have necotizing soft tissue infection of L groin and transferred to [facility] from [place] on 05/19/19 underwent I&D of L groin and planned repeat I&D 5/10.  PMH positive for CAD s/p NSTEMI and stent 1 month ago, DM, systolic CHF, CKD 3, HTN, cocaine use, OSA, PVD, apical mural thrombus.    PT Comments    Pt moving more independently to EOB but very slowly and having difficulty tolerating pain he is experiencing in testicles today.  Tolerated partial standing in stedy with +2 max A. Attempted modified sitting position on pillow on flaps of stedy but pt unable to tolerate this. May d/c to SNF today, daughter made aware by RN. PT will continue to follow.   Follow Up Recommendations  SNF;Supervision/Assistance - 24 hour     Equipment Recommendations  None recommended by PT    Recommendations for Other Services       Precautions / Restrictions Precautions Precautions: Fall Restrictions Weight Bearing Restrictions: No    Mobility  Bed Mobility Overal bed mobility: Needs Assistance Bed Mobility: Supine to Sit;Sit to Supine     Supine to sit: Mod assist Sit to supine: Mod assist;+2 for physical assistance   General bed mobility comments: pt moves VERY slowly and preferred to move on his own today. Able to slowly get LE's off bed, mod HHA for final elevation of trunk. Pt needed mod A +2 for return to supine.   Transfers Overall transfer level: Needs assistance Equipment used: Ambulation equipment used Transfers: Sit to/from Stand Sit to Stand: Mod assist;+2 physical assistance         General transfer comment: helped pt to stand within stedy to relieve pressure from testicles. Pt did not maintain erect posture but rather leaned over  front rail of stedy. Attempted sitting on pillow on flaps but pt could not tolerate this.   Ambulation/Gait             General Gait Details: unable   Stairs             Wheelchair Mobility    Modified Rankin (Stroke Patients Only)       Balance Overall balance assessment: Needs assistance Sitting-balance support: Feet supported;Single extremity supported Sitting balance-Leahy Scale: Fair Sitting balance - Comments: able to sit EOB without physical assist   Standing balance support: Bilateral upper extremity supported Standing balance-Leahy Scale: Zero                              Cognition Arousal/Alertness: Awake/alert Behavior During Therapy: WFL for tasks assessed/performed Overall Cognitive Status: No family/caregiver present to determine baseline cognitive functioning Area of Impairment: Problem solving;Awareness;Safety/judgement;Following commands;Attention                   Current Attention Level: Selective   Following Commands: Follows one step commands inconsistently;Follows one step commands with increased time Safety/Judgement: Decreased awareness of safety;Decreased awareness of deficits Awareness: Intellectual Problem Solving: Slow processing;Requires verbal cues General Comments: slow processing noted      Exercises      General Comments        Pertinent Vitals/Pain Pain Assessment: Faces Faces Pain Scale: Hurts whole lot Pain Location: L groin and testicles  Pain Descriptors / Indicators: Guarding;Grimacing Pain Intervention(s): Limited activity within patient's tolerance;Monitored during session;Repositioned    Home Living                      Prior Function            PT Goals (current goals can now be found in the care plan section) Acute Rehab PT Goals Patient Stated Goal: get better PT Goal Formulation: With patient Time For Goal Achievement: 05/31/19 Progress towards PT goals: Progressing  toward goals    Frequency    Min 2X/week      PT Plan Current plan remains appropriate;Frequency needs to be updated    Co-evaluation              AM-PAC PT "6 Clicks" Mobility   Outcome Measure  Help needed turning from your back to your side while in a flat bed without using bedrails?: A Little Help needed moving from lying on your back to sitting on the side of a flat bed without using bedrails?: A Lot Help needed moving to and from a bed to a chair (including a wheelchair)?: A Lot Help needed standing up from a chair using your arms (e.g., wheelchair or bedside chair)?: A Lot Help needed to walk in hospital room?: Total Help needed climbing 3-5 steps with a railing? : Total 6 Click Score: 11    End of Session Equipment Utilized During Treatment: Gait belt Activity Tolerance: Patient limited by pain Patient left: in bed;with call bell/phone within reach;with bed alarm set Nurse Communication: Mobility status PT Visit Diagnosis: Pain;Difficulty in walking, not elsewhere classified (R26.2);Other abnormalities of gait and mobility (R26.89) Pain - Right/Left: Left Pain - part of body: Hip     Time: 1030-1100 PT Time Calculation (min) (ACUTE ONLY): 30 min  Charges:  $Therapeutic Activity: 23-37 mins                     Leighton Roach, [place]  Pager [phone] Office [place] 05/27/2019, 1:23 PM

## 2019-05-27 NOTE — Progress Notes (Addendum)
TRH  The patient has insurance auth for SNF. I have examined him this AM and so has general surgery.  General surgery feels wound is clean. His Hb is stable. His WBC has gone up a little but no fevers noted but I see no signs or symptoms of infection He is stable to d/c to SNF today. Please see my d/c summary from 5/14 which I have updated today.  Debbe Odea, MD

## 2019-05-28 DIAGNOSIS — E119 Type 2 diabetes mellitus without complications: Secondary | ICD-10-CM | POA: Diagnosis not present

## 2019-05-28 DIAGNOSIS — M726 Necrotizing fasciitis: Secondary | ICD-10-CM | POA: Diagnosis not present

## 2019-05-28 DIAGNOSIS — I739 Peripheral vascular disease, unspecified: Secondary | ICD-10-CM | POA: Diagnosis not present

## 2019-05-28 DIAGNOSIS — I10 Essential (primary) hypertension: Secondary | ICD-10-CM | POA: Diagnosis not present

## 2019-06-05 DIAGNOSIS — E119 Type 2 diabetes mellitus without complications: Secondary | ICD-10-CM | POA: Diagnosis not present

## 2019-06-05 DIAGNOSIS — I429 Cardiomyopathy, unspecified: Secondary | ICD-10-CM | POA: Diagnosis not present

## 2019-06-05 DIAGNOSIS — E8809 Other disorders of plasma-protein metabolism, not elsewhere classified: Secondary | ICD-10-CM | POA: Diagnosis not present

## 2019-06-05 DIAGNOSIS — I10 Essential (primary) hypertension: Secondary | ICD-10-CM | POA: Diagnosis not present

## 2019-06-12 DIAGNOSIS — I10 Essential (primary) hypertension: Secondary | ICD-10-CM | POA: Diagnosis not present

## 2019-06-12 DIAGNOSIS — I739 Peripheral vascular disease, unspecified: Secondary | ICD-10-CM | POA: Diagnosis not present

## 2019-06-12 DIAGNOSIS — E119 Type 2 diabetes mellitus without complications: Secondary | ICD-10-CM | POA: Diagnosis not present

## 2019-06-12 DIAGNOSIS — M726 Necrotizing fasciitis: Secondary | ICD-10-CM | POA: Diagnosis not present

## 2019-06-19 DIAGNOSIS — I429 Cardiomyopathy, unspecified: Secondary | ICD-10-CM | POA: Diagnosis not present

## 2019-06-19 DIAGNOSIS — E119 Type 2 diabetes mellitus without complications: Secondary | ICD-10-CM | POA: Diagnosis not present

## 2019-06-19 DIAGNOSIS — M726 Necrotizing fasciitis: Secondary | ICD-10-CM | POA: Diagnosis not present

## 2019-06-19 DIAGNOSIS — I10 Essential (primary) hypertension: Secondary | ICD-10-CM | POA: Diagnosis not present

## 2019-06-20 DIAGNOSIS — N179 Acute kidney failure, unspecified: Secondary | ICD-10-CM | POA: Diagnosis not present

## 2019-06-20 DIAGNOSIS — I429 Cardiomyopathy, unspecified: Secondary | ICD-10-CM | POA: Diagnosis not present

## 2019-06-20 DIAGNOSIS — E119 Type 2 diabetes mellitus without complications: Secondary | ICD-10-CM | POA: Diagnosis not present

## 2019-06-20 DIAGNOSIS — I739 Peripheral vascular disease, unspecified: Secondary | ICD-10-CM | POA: Diagnosis not present

## 2019-06-23 DIAGNOSIS — A419 Sepsis, unspecified organism: Secondary | ICD-10-CM | POA: Diagnosis not present

## 2019-06-23 DIAGNOSIS — T40601D Poisoning by unspecified narcotics, accidental (unintentional), subsequent encounter: Secondary | ICD-10-CM | POA: Diagnosis not present

## 2019-06-23 DIAGNOSIS — E1122 Type 2 diabetes mellitus with diabetic chronic kidney disease: Secondary | ICD-10-CM | POA: Diagnosis not present

## 2019-06-23 DIAGNOSIS — I513 Intracardiac thrombosis, not elsewhere classified: Secondary | ICD-10-CM | POA: Diagnosis not present

## 2019-06-23 DIAGNOSIS — E1140 Type 2 diabetes mellitus with diabetic neuropathy, unspecified: Secondary | ICD-10-CM | POA: Diagnosis not present

## 2019-06-23 DIAGNOSIS — I129 Hypertensive chronic kidney disease with stage 1 through stage 4 chronic kidney disease, or unspecified chronic kidney disease: Secondary | ICD-10-CM | POA: Diagnosis not present

## 2019-06-23 DIAGNOSIS — N189 Chronic kidney disease, unspecified: Secondary | ICD-10-CM | POA: Diagnosis not present

## 2019-06-23 DIAGNOSIS — M726 Necrotizing fasciitis: Secondary | ICD-10-CM | POA: Diagnosis not present

## 2019-06-23 DIAGNOSIS — I252 Old myocardial infarction: Secondary | ICD-10-CM | POA: Diagnosis not present

## 2019-06-23 DIAGNOSIS — E1151 Type 2 diabetes mellitus with diabetic peripheral angiopathy without gangrene: Secondary | ICD-10-CM | POA: Diagnosis not present

## 2019-06-23 DIAGNOSIS — E1165 Type 2 diabetes mellitus with hyperglycemia: Secondary | ICD-10-CM | POA: Diagnosis not present

## 2019-06-23 DIAGNOSIS — I255 Ischemic cardiomyopathy: Secondary | ICD-10-CM | POA: Diagnosis not present

## 2019-06-28 DIAGNOSIS — E1165 Type 2 diabetes mellitus with hyperglycemia: Secondary | ICD-10-CM | POA: Diagnosis not present

## 2019-06-28 DIAGNOSIS — I255 Ischemic cardiomyopathy: Secondary | ICD-10-CM | POA: Diagnosis not present

## 2019-06-28 DIAGNOSIS — N189 Chronic kidney disease, unspecified: Secondary | ICD-10-CM | POA: Diagnosis not present

## 2019-06-28 DIAGNOSIS — E1122 Type 2 diabetes mellitus with diabetic chronic kidney disease: Secondary | ICD-10-CM | POA: Diagnosis not present

## 2019-06-28 DIAGNOSIS — I252 Old myocardial infarction: Secondary | ICD-10-CM | POA: Diagnosis not present

## 2019-06-28 DIAGNOSIS — E1151 Type 2 diabetes mellitus with diabetic peripheral angiopathy without gangrene: Secondary | ICD-10-CM | POA: Diagnosis not present

## 2019-06-28 DIAGNOSIS — M726 Necrotizing fasciitis: Secondary | ICD-10-CM | POA: Diagnosis not present

## 2019-06-28 DIAGNOSIS — A419 Sepsis, unspecified organism: Secondary | ICD-10-CM | POA: Diagnosis not present

## 2019-06-28 DIAGNOSIS — T40601D Poisoning by unspecified narcotics, accidental (unintentional), subsequent encounter: Secondary | ICD-10-CM | POA: Diagnosis not present

## 2019-06-28 DIAGNOSIS — I129 Hypertensive chronic kidney disease with stage 1 through stage 4 chronic kidney disease, or unspecified chronic kidney disease: Secondary | ICD-10-CM | POA: Diagnosis not present

## 2019-06-28 DIAGNOSIS — I513 Intracardiac thrombosis, not elsewhere classified: Secondary | ICD-10-CM | POA: Diagnosis not present

## 2019-06-28 DIAGNOSIS — E1140 Type 2 diabetes mellitus with diabetic neuropathy, unspecified: Secondary | ICD-10-CM | POA: Diagnosis not present

## 2019-06-29 DIAGNOSIS — A419 Sepsis, unspecified organism: Secondary | ICD-10-CM | POA: Diagnosis not present

## 2019-06-29 DIAGNOSIS — I513 Intracardiac thrombosis, not elsewhere classified: Secondary | ICD-10-CM | POA: Diagnosis not present

## 2019-06-29 DIAGNOSIS — E1165 Type 2 diabetes mellitus with hyperglycemia: Secondary | ICD-10-CM | POA: Diagnosis not present

## 2019-06-29 DIAGNOSIS — I255 Ischemic cardiomyopathy: Secondary | ICD-10-CM | POA: Diagnosis not present

## 2019-06-29 DIAGNOSIS — E1122 Type 2 diabetes mellitus with diabetic chronic kidney disease: Secondary | ICD-10-CM | POA: Diagnosis not present

## 2019-06-29 DIAGNOSIS — T40601D Poisoning by unspecified narcotics, accidental (unintentional), subsequent encounter: Secondary | ICD-10-CM | POA: Diagnosis not present

## 2019-06-29 DIAGNOSIS — M726 Necrotizing fasciitis: Secondary | ICD-10-CM | POA: Diagnosis not present

## 2019-06-29 DIAGNOSIS — I252 Old myocardial infarction: Secondary | ICD-10-CM | POA: Diagnosis not present

## 2019-06-29 DIAGNOSIS — I129 Hypertensive chronic kidney disease with stage 1 through stage 4 chronic kidney disease, or unspecified chronic kidney disease: Secondary | ICD-10-CM | POA: Diagnosis not present

## 2019-06-29 DIAGNOSIS — E1140 Type 2 diabetes mellitus with diabetic neuropathy, unspecified: Secondary | ICD-10-CM | POA: Diagnosis not present

## 2019-06-29 DIAGNOSIS — N189 Chronic kidney disease, unspecified: Secondary | ICD-10-CM | POA: Diagnosis not present

## 2019-06-29 DIAGNOSIS — E1151 Type 2 diabetes mellitus with diabetic peripheral angiopathy without gangrene: Secondary | ICD-10-CM | POA: Diagnosis not present

## 2019-07-01 DIAGNOSIS — E1140 Type 2 diabetes mellitus with diabetic neuropathy, unspecified: Secondary | ICD-10-CM | POA: Diagnosis not present

## 2019-07-01 DIAGNOSIS — T40601D Poisoning by unspecified narcotics, accidental (unintentional), subsequent encounter: Secondary | ICD-10-CM | POA: Diagnosis not present

## 2019-07-01 DIAGNOSIS — I255 Ischemic cardiomyopathy: Secondary | ICD-10-CM | POA: Diagnosis not present

## 2019-07-01 DIAGNOSIS — I129 Hypertensive chronic kidney disease with stage 1 through stage 4 chronic kidney disease, or unspecified chronic kidney disease: Secondary | ICD-10-CM | POA: Diagnosis not present

## 2019-07-01 DIAGNOSIS — I252 Old myocardial infarction: Secondary | ICD-10-CM | POA: Diagnosis not present

## 2019-07-01 DIAGNOSIS — A419 Sepsis, unspecified organism: Secondary | ICD-10-CM | POA: Diagnosis not present

## 2019-07-01 DIAGNOSIS — I513 Intracardiac thrombosis, not elsewhere classified: Secondary | ICD-10-CM | POA: Diagnosis not present

## 2019-07-01 DIAGNOSIS — N189 Chronic kidney disease, unspecified: Secondary | ICD-10-CM | POA: Diagnosis not present

## 2019-07-01 DIAGNOSIS — M726 Necrotizing fasciitis: Secondary | ICD-10-CM | POA: Diagnosis not present

## 2019-07-01 DIAGNOSIS — E1165 Type 2 diabetes mellitus with hyperglycemia: Secondary | ICD-10-CM | POA: Diagnosis not present

## 2019-07-01 DIAGNOSIS — E1151 Type 2 diabetes mellitus with diabetic peripheral angiopathy without gangrene: Secondary | ICD-10-CM | POA: Diagnosis not present

## 2019-07-01 DIAGNOSIS — E1122 Type 2 diabetes mellitus with diabetic chronic kidney disease: Secondary | ICD-10-CM | POA: Diagnosis not present

## 2019-07-03 DIAGNOSIS — I129 Hypertensive chronic kidney disease with stage 1 through stage 4 chronic kidney disease, or unspecified chronic kidney disease: Secondary | ICD-10-CM | POA: Diagnosis not present

## 2019-07-03 DIAGNOSIS — E1140 Type 2 diabetes mellitus with diabetic neuropathy, unspecified: Secondary | ICD-10-CM | POA: Diagnosis not present

## 2019-07-03 DIAGNOSIS — I513 Intracardiac thrombosis, not elsewhere classified: Secondary | ICD-10-CM | POA: Diagnosis not present

## 2019-07-03 DIAGNOSIS — E1122 Type 2 diabetes mellitus with diabetic chronic kidney disease: Secondary | ICD-10-CM | POA: Diagnosis not present

## 2019-07-03 DIAGNOSIS — M726 Necrotizing fasciitis: Secondary | ICD-10-CM | POA: Diagnosis not present

## 2019-07-03 DIAGNOSIS — N189 Chronic kidney disease, unspecified: Secondary | ICD-10-CM | POA: Diagnosis not present

## 2019-07-03 DIAGNOSIS — I255 Ischemic cardiomyopathy: Secondary | ICD-10-CM | POA: Diagnosis not present

## 2019-07-03 DIAGNOSIS — E1165 Type 2 diabetes mellitus with hyperglycemia: Secondary | ICD-10-CM | POA: Diagnosis not present

## 2019-07-03 DIAGNOSIS — E1151 Type 2 diabetes mellitus with diabetic peripheral angiopathy without gangrene: Secondary | ICD-10-CM | POA: Diagnosis not present

## 2019-07-03 DIAGNOSIS — T40601D Poisoning by unspecified narcotics, accidental (unintentional), subsequent encounter: Secondary | ICD-10-CM | POA: Diagnosis not present

## 2019-07-03 DIAGNOSIS — I252 Old myocardial infarction: Secondary | ICD-10-CM | POA: Diagnosis not present

## 2019-07-03 DIAGNOSIS — A419 Sepsis, unspecified organism: Secondary | ICD-10-CM | POA: Diagnosis not present

## 2019-07-05 DIAGNOSIS — I252 Old myocardial infarction: Secondary | ICD-10-CM | POA: Diagnosis not present

## 2019-07-05 DIAGNOSIS — E1140 Type 2 diabetes mellitus with diabetic neuropathy, unspecified: Secondary | ICD-10-CM | POA: Diagnosis not present

## 2019-07-05 DIAGNOSIS — E1151 Type 2 diabetes mellitus with diabetic peripheral angiopathy without gangrene: Secondary | ICD-10-CM | POA: Diagnosis not present

## 2019-07-05 DIAGNOSIS — T40601D Poisoning by unspecified narcotics, accidental (unintentional), subsequent encounter: Secondary | ICD-10-CM | POA: Diagnosis not present

## 2019-07-05 DIAGNOSIS — N189 Chronic kidney disease, unspecified: Secondary | ICD-10-CM | POA: Diagnosis not present

## 2019-07-05 DIAGNOSIS — A419 Sepsis, unspecified organism: Secondary | ICD-10-CM | POA: Diagnosis not present

## 2019-07-05 DIAGNOSIS — M726 Necrotizing fasciitis: Secondary | ICD-10-CM | POA: Diagnosis not present

## 2019-07-05 DIAGNOSIS — E1165 Type 2 diabetes mellitus with hyperglycemia: Secondary | ICD-10-CM | POA: Diagnosis not present

## 2019-07-05 DIAGNOSIS — I129 Hypertensive chronic kidney disease with stage 1 through stage 4 chronic kidney disease, or unspecified chronic kidney disease: Secondary | ICD-10-CM | POA: Diagnosis not present

## 2019-07-05 DIAGNOSIS — I255 Ischemic cardiomyopathy: Secondary | ICD-10-CM | POA: Diagnosis not present

## 2019-07-05 DIAGNOSIS — E1122 Type 2 diabetes mellitus with diabetic chronic kidney disease: Secondary | ICD-10-CM | POA: Diagnosis not present

## 2019-07-05 DIAGNOSIS — I513 Intracardiac thrombosis, not elsewhere classified: Secondary | ICD-10-CM | POA: Diagnosis not present

## 2019-07-06 DIAGNOSIS — T40601D Poisoning by unspecified narcotics, accidental (unintentional), subsequent encounter: Secondary | ICD-10-CM | POA: Diagnosis not present

## 2019-07-06 DIAGNOSIS — I129 Hypertensive chronic kidney disease with stage 1 through stage 4 chronic kidney disease, or unspecified chronic kidney disease: Secondary | ICD-10-CM | POA: Diagnosis not present

## 2019-07-06 DIAGNOSIS — E1151 Type 2 diabetes mellitus with diabetic peripheral angiopathy without gangrene: Secondary | ICD-10-CM | POA: Diagnosis not present

## 2019-07-06 DIAGNOSIS — E1140 Type 2 diabetes mellitus with diabetic neuropathy, unspecified: Secondary | ICD-10-CM | POA: Diagnosis not present

## 2019-07-06 DIAGNOSIS — E1122 Type 2 diabetes mellitus with diabetic chronic kidney disease: Secondary | ICD-10-CM | POA: Diagnosis not present

## 2019-07-06 DIAGNOSIS — M726 Necrotizing fasciitis: Secondary | ICD-10-CM | POA: Diagnosis not present

## 2019-07-06 DIAGNOSIS — N189 Chronic kidney disease, unspecified: Secondary | ICD-10-CM | POA: Diagnosis not present

## 2019-07-06 DIAGNOSIS — A419 Sepsis, unspecified organism: Secondary | ICD-10-CM | POA: Diagnosis not present

## 2019-07-06 DIAGNOSIS — I252 Old myocardial infarction: Secondary | ICD-10-CM | POA: Diagnosis not present

## 2019-07-06 DIAGNOSIS — I255 Ischemic cardiomyopathy: Secondary | ICD-10-CM | POA: Diagnosis not present

## 2019-07-06 DIAGNOSIS — E1165 Type 2 diabetes mellitus with hyperglycemia: Secondary | ICD-10-CM | POA: Diagnosis not present

## 2019-07-06 DIAGNOSIS — I513 Intracardiac thrombosis, not elsewhere classified: Secondary | ICD-10-CM | POA: Diagnosis not present

## 2019-07-08 DIAGNOSIS — E1122 Type 2 diabetes mellitus with diabetic chronic kidney disease: Secondary | ICD-10-CM | POA: Diagnosis not present

## 2019-07-08 DIAGNOSIS — I255 Ischemic cardiomyopathy: Secondary | ICD-10-CM | POA: Diagnosis not present

## 2019-07-08 DIAGNOSIS — A419 Sepsis, unspecified organism: Secondary | ICD-10-CM | POA: Diagnosis not present

## 2019-07-08 DIAGNOSIS — N189 Chronic kidney disease, unspecified: Secondary | ICD-10-CM | POA: Diagnosis not present

## 2019-07-08 DIAGNOSIS — I252 Old myocardial infarction: Secondary | ICD-10-CM | POA: Diagnosis not present

## 2019-07-08 DIAGNOSIS — I129 Hypertensive chronic kidney disease with stage 1 through stage 4 chronic kidney disease, or unspecified chronic kidney disease: Secondary | ICD-10-CM | POA: Diagnosis not present

## 2019-07-08 DIAGNOSIS — T40601D Poisoning by unspecified narcotics, accidental (unintentional), subsequent encounter: Secondary | ICD-10-CM | POA: Diagnosis not present

## 2019-07-08 DIAGNOSIS — E1151 Type 2 diabetes mellitus with diabetic peripheral angiopathy without gangrene: Secondary | ICD-10-CM | POA: Diagnosis not present

## 2019-07-08 DIAGNOSIS — I513 Intracardiac thrombosis, not elsewhere classified: Secondary | ICD-10-CM | POA: Diagnosis not present

## 2019-07-08 DIAGNOSIS — M726 Necrotizing fasciitis: Secondary | ICD-10-CM | POA: Diagnosis not present

## 2019-07-08 DIAGNOSIS — E1140 Type 2 diabetes mellitus with diabetic neuropathy, unspecified: Secondary | ICD-10-CM | POA: Diagnosis not present

## 2019-07-08 DIAGNOSIS — E1165 Type 2 diabetes mellitus with hyperglycemia: Secondary | ICD-10-CM | POA: Diagnosis not present

## 2019-07-10 DIAGNOSIS — I252 Old myocardial infarction: Secondary | ICD-10-CM | POA: Diagnosis not present

## 2019-07-10 DIAGNOSIS — I255 Ischemic cardiomyopathy: Secondary | ICD-10-CM | POA: Diagnosis not present

## 2019-07-10 DIAGNOSIS — I513 Intracardiac thrombosis, not elsewhere classified: Secondary | ICD-10-CM | POA: Diagnosis not present

## 2019-07-10 DIAGNOSIS — E1151 Type 2 diabetes mellitus with diabetic peripheral angiopathy without gangrene: Secondary | ICD-10-CM | POA: Diagnosis not present

## 2019-07-10 DIAGNOSIS — I129 Hypertensive chronic kidney disease with stage 1 through stage 4 chronic kidney disease, or unspecified chronic kidney disease: Secondary | ICD-10-CM | POA: Diagnosis not present

## 2019-07-10 DIAGNOSIS — M726 Necrotizing fasciitis: Secondary | ICD-10-CM | POA: Diagnosis not present

## 2019-07-10 DIAGNOSIS — E1122 Type 2 diabetes mellitus with diabetic chronic kidney disease: Secondary | ICD-10-CM | POA: Diagnosis not present

## 2019-07-10 DIAGNOSIS — T40601D Poisoning by unspecified narcotics, accidental (unintentional), subsequent encounter: Secondary | ICD-10-CM | POA: Diagnosis not present

## 2019-07-10 DIAGNOSIS — N189 Chronic kidney disease, unspecified: Secondary | ICD-10-CM | POA: Diagnosis not present

## 2019-07-10 DIAGNOSIS — A419 Sepsis, unspecified organism: Secondary | ICD-10-CM | POA: Diagnosis not present

## 2019-07-10 DIAGNOSIS — E1140 Type 2 diabetes mellitus with diabetic neuropathy, unspecified: Secondary | ICD-10-CM | POA: Diagnosis not present

## 2019-07-10 DIAGNOSIS — E1165 Type 2 diabetes mellitus with hyperglycemia: Secondary | ICD-10-CM | POA: Diagnosis not present

## 2019-07-12 DIAGNOSIS — N189 Chronic kidney disease, unspecified: Secondary | ICD-10-CM | POA: Diagnosis not present

## 2019-07-12 DIAGNOSIS — M726 Necrotizing fasciitis: Secondary | ICD-10-CM | POA: Diagnosis not present

## 2019-07-12 DIAGNOSIS — I513 Intracardiac thrombosis, not elsewhere classified: Secondary | ICD-10-CM | POA: Diagnosis not present

## 2019-07-12 DIAGNOSIS — A419 Sepsis, unspecified organism: Secondary | ICD-10-CM | POA: Diagnosis not present

## 2019-07-12 DIAGNOSIS — E1165 Type 2 diabetes mellitus with hyperglycemia: Secondary | ICD-10-CM | POA: Diagnosis not present

## 2019-07-12 DIAGNOSIS — I255 Ischemic cardiomyopathy: Secondary | ICD-10-CM | POA: Diagnosis not present

## 2019-07-12 DIAGNOSIS — I252 Old myocardial infarction: Secondary | ICD-10-CM | POA: Diagnosis not present

## 2019-07-12 DIAGNOSIS — I129 Hypertensive chronic kidney disease with stage 1 through stage 4 chronic kidney disease, or unspecified chronic kidney disease: Secondary | ICD-10-CM | POA: Diagnosis not present

## 2019-07-12 DIAGNOSIS — E1151 Type 2 diabetes mellitus with diabetic peripheral angiopathy without gangrene: Secondary | ICD-10-CM | POA: Diagnosis not present

## 2019-07-12 DIAGNOSIS — E1140 Type 2 diabetes mellitus with diabetic neuropathy, unspecified: Secondary | ICD-10-CM | POA: Diagnosis not present

## 2019-07-12 DIAGNOSIS — E1122 Type 2 diabetes mellitus with diabetic chronic kidney disease: Secondary | ICD-10-CM | POA: Diagnosis not present

## 2019-07-12 DIAGNOSIS — T40601D Poisoning by unspecified narcotics, accidental (unintentional), subsequent encounter: Secondary | ICD-10-CM | POA: Diagnosis not present

## 2019-07-22 DIAGNOSIS — I513 Intracardiac thrombosis, not elsewhere classified: Secondary | ICD-10-CM | POA: Diagnosis not present

## 2019-07-22 DIAGNOSIS — I252 Old myocardial infarction: Secondary | ICD-10-CM | POA: Diagnosis not present

## 2019-07-22 DIAGNOSIS — T40601D Poisoning by unspecified narcotics, accidental (unintentional), subsequent encounter: Secondary | ICD-10-CM | POA: Diagnosis not present

## 2019-07-22 DIAGNOSIS — N189 Chronic kidney disease, unspecified: Secondary | ICD-10-CM | POA: Diagnosis not present

## 2019-07-22 DIAGNOSIS — E1151 Type 2 diabetes mellitus with diabetic peripheral angiopathy without gangrene: Secondary | ICD-10-CM | POA: Diagnosis not present

## 2019-07-22 DIAGNOSIS — E1165 Type 2 diabetes mellitus with hyperglycemia: Secondary | ICD-10-CM | POA: Diagnosis not present

## 2019-07-22 DIAGNOSIS — E1122 Type 2 diabetes mellitus with diabetic chronic kidney disease: Secondary | ICD-10-CM | POA: Diagnosis not present

## 2019-07-22 DIAGNOSIS — M726 Necrotizing fasciitis: Secondary | ICD-10-CM | POA: Diagnosis not present

## 2019-07-22 DIAGNOSIS — I255 Ischemic cardiomyopathy: Secondary | ICD-10-CM | POA: Diagnosis not present

## 2019-07-22 DIAGNOSIS — I129 Hypertensive chronic kidney disease with stage 1 through stage 4 chronic kidney disease, or unspecified chronic kidney disease: Secondary | ICD-10-CM | POA: Diagnosis not present

## 2019-07-22 DIAGNOSIS — E1140 Type 2 diabetes mellitus with diabetic neuropathy, unspecified: Secondary | ICD-10-CM | POA: Diagnosis not present

## 2019-07-22 DIAGNOSIS — A419 Sepsis, unspecified organism: Secondary | ICD-10-CM | POA: Diagnosis not present

## 2019-07-29 ENCOUNTER — Encounter (HOSPITAL_COMMUNITY): Payer: Self-pay | Admitting: Emergency Medicine

## 2019-07-29 ENCOUNTER — Emergency Department (HOSPITAL_COMMUNITY)
Admission: EM | Admit: 2019-07-29 | Discharge: 2019-07-29 | Disposition: A | Discharge status: Home / Self Care | Payer: No Typology Code available for payment source | Attending: Emergency Medicine | Admitting: Emergency Medicine

## 2019-07-29 ENCOUNTER — Other Ambulatory Visit: Payer: Self-pay

## 2019-07-29 DIAGNOSIS — I129 Hypertensive chronic kidney disease with stage 1 through stage 4 chronic kidney disease, or unspecified chronic kidney disease: Secondary | ICD-10-CM | POA: Diagnosis not present

## 2019-07-29 DIAGNOSIS — E1122 Type 2 diabetes mellitus with diabetic chronic kidney disease: Secondary | ICD-10-CM | POA: Diagnosis not present

## 2019-07-29 DIAGNOSIS — I2510 Atherosclerotic heart disease of native coronary artery without angina pectoris: Secondary | ICD-10-CM | POA: Insufficient documentation

## 2019-07-29 DIAGNOSIS — N183 Chronic kidney disease, stage 3 unspecified: Secondary | ICD-10-CM | POA: Insufficient documentation

## 2019-07-29 DIAGNOSIS — Z794 Long term (current) use of insulin: Secondary | ICD-10-CM | POA: Diagnosis not present

## 2019-07-29 DIAGNOSIS — R0789 Other chest pain: Secondary | ICD-10-CM | POA: Diagnosis not present

## 2019-07-29 DIAGNOSIS — Z7982 Long term (current) use of aspirin: Secondary | ICD-10-CM | POA: Diagnosis not present

## 2019-07-29 DIAGNOSIS — Z87891 Personal history of nicotine dependence: Secondary | ICD-10-CM | POA: Diagnosis not present

## 2019-07-29 DIAGNOSIS — T8483XA Hemorrhage due to internal orthopedic prosthetic devices, implants and grafts, initial encounter: Secondary | ICD-10-CM | POA: Insufficient documentation

## 2019-07-29 DIAGNOSIS — S71002A Unspecified open wound, left hip, initial encounter: Secondary | ICD-10-CM

## 2019-07-29 DIAGNOSIS — Z743 Need for continuous supervision: Secondary | ICD-10-CM | POA: Diagnosis not present

## 2019-07-29 DIAGNOSIS — R58 Hemorrhage, not elsewhere classified: Secondary | ICD-10-CM | POA: Diagnosis not present

## 2019-07-29 DIAGNOSIS — R079 Chest pain, unspecified: Secondary | ICD-10-CM | POA: Diagnosis not present

## 2019-07-29 MED ORDER — LIDOCAINE-EPINEPHRINE (PF) 2 %-1:200000 IJ SOLN: 20.0000 mL | Freq: Once | INTRAMUSCULAR | Status: AC

## 2019-07-29 MED ORDER — LIDOCAINE-EPINEPHRINE-TETRACAINE (LET) TOPICAL GEL
3.0000 mL | Freq: Once | TOPICAL | Status: AC
  Administered 2019-07-29: 3 mL via TOPICAL

## 2019-07-29 MED ORDER — SILVER NITRATE-POT NITRATE 75-25 % EX MISC
2.0000 | Freq: Once | CUTANEOUS | Status: AC
  Administered 2019-07-29: 2 via TOPICAL

## 2019-07-29 MED ORDER — LIDOCAINE-EPINEPHRINE (PF) 2 %-1:200000 IJ SOLN
INTRAMUSCULAR | Status: AC
  Administered 2019-07-29: 20 mL
  Filled 2019-07-29: qty 20

## 2019-07-29 NOTE — ED Triage Notes (Signed)
Pt has extensive surgery for abscess with gangrene back in May. Today pt thought he was removing drainage but instead it was a scab. Pt currently taking blood thinners at home. Pt arrives with significant amount of bleeding.

## 2019-07-29 NOTE — ED Provider Notes (Signed)
[facility] EMERGENCY DEPARTMENT Provider Note   CSN: [id number] Arrival date & time: 07/29/19  2103     History Chief Complaint  Patient presents with  . Wound Check    [name] is a 64 y.o. male.  HPI   64 year old male with bleeding from left thigh wound.  2 months ago he had debridement of a necrotizing wound in this area.  Earlier today he was scratching the wound we began having bleeding.  It has persisted despite pressure.  He is anticoagulated on Xarelto.  Denies any overt bleeding elsewhere.  Past Medical History:  Diagnosis Date  . Bulging lumbar disc   . CAD (coronary artery disease) [phone]   a. prior LAD stenting. b. s/p DES to [facility] 08/2015 @ [facility]. c. 04/2016 Cardiac cath at [facility]. # vessel CAD, with patent stent in the PLAD and RCA. Diffuse dLAD, OM2, and  RPDA disease. No aortic stenosis. Elevated LVEDP. No lesion to suggest ACS. Continue medical managment.  . Chronic lower back pain   . CKD (chronic kidney disease), stage II   . Cocaine abuse ([place])   . DM2 (diabetes mellitus, type 2) ([place])   . GERD (gastroesophageal reflux disease)   . Headache    "often; not regular" (11/30/2017)  . HTN (hypertension)   . Hyperlipidemia   . Ischemic cardiomyopathy   . MI, old   . Obesity   . Pneumonia    hx  . Sleep apnea    "have machine; I don't use it" (11/30/2017)  . Tobacco use     Patient Active Problem List   Diagnosis Date Noted  . Necrotizing fasciitis ([place]) 05/22/2019  . Opiate overdose ([place])   . Uncontrolled type 2 diabetes mellitus with hyperglycemia ([place]) 05/16/2019  . Elevated lactic acid level   . Acute renal failure superimposed on stage 3b chronic kidney disease ([place]) 05/15/2019  . Cocaine abuse ([place]) 05/15/2019  . Severe sepsis ([place]) 05/14/2019  . Unstable angina ([place]) 10/16/2018  . Apical mural thrombus 10/15/2018  . Claudication in peripheral vascular disease ([place]) 11/30/2017  . Peripheral arterial disease ([place]) 11/28/2017   . Chest pain 11/03/2016  . Constipation 11/03/2016  . CKD (chronic kidney disease), stage II 08/02/2016  . Cellulitis of right hand 08/02/2016  . Chronic pain 08/02/2016  . AKI (acute kidney injury) ([place]) 01/31/2016  . Lobar pneumonia ([place]) 01/31/2016  . Influenza A 01/31/2016  . Diarrhea 01/30/2016  . NSVT (nonsustained ventricular tachycardia) ([place]) 09/12/2015  . Precordial chest pain 09/10/2015  . Cocaine use 09/04/2015  . Near syncope 06/29/2015  . Essential hypertension 06/29/2015  . Status post coronary artery stent placement 06/29/2015  . Insulin dependent diabetes mellitus 06/29/2015  . History of MI (myocardial infarction) 06/29/2015  . Chronic systolic CHF 17/79/3903  . Musculoskeletal chest pain 05/27/2015  . OSA on CPAP 01/21/2015  . Rectal bleeding   . Stage 3 chronic renal impairment associated with type 2 diabetes mellitus ([place]) 09/09/2013  . 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 CAD (coronary artery disease) 09/09/2013  . Cardiomyopathy, ischemic-EF 40-45% by echo 09/07/13 09/09/2013  . S/P LAD DES June 2014 09/06/2013  . Tobacco use disorder 09/06/2013  . Hyperglycemia 08/22/2012  . NSTEMI (non-ST elevated myocardial infarction) ([place]) 07/07/2012  . Dyslipidemia 07/07/2012  . Hypertensive heart disease     Past Surgical History:  Procedure Laterality Date  . APPENDECTOMY    . CARDIAC CATHETERIZATION N/A 09/07/2015   Procedure: Left Heart Cath  and Coronary Angiography;  Surgeon: Leonie Man, MD;  Location: [place] CV LAB;  Service: Cardiovascular;  Laterality: N/A;  . CARDIAC CATHETERIZATION N/A 09/07/2015   Procedure: Coronary Stent Intervention;  Surgeon: Leonie Man, MD;  Location: [place] CV LAB;  Service: Cardiovascular;  Laterality: N/A;  . CORONARY ANGIOGRAM  09/07/13   residual RCA and OM disease  . CORONARY ANGIOPLASTY WITH STENT PLACEMENT    . CORONARY STENT INTERVENTION N/A 10/16/2018   Procedure: CORONARY STENT  INTERVENTION;  Surgeon: Burnell Blanks, MD;  Location: [place] CV LAB;  Service: Cardiovascular;  Laterality: N/A;  . CORONARY STENT INTERVENTION N/A 03/11/2019   Procedure: CORONARY STENT INTERVENTION;  Surgeon: [name], MD;  Location: [place] CV LAB;  Service: Cardiovascular;  Laterality: N/A;  . FRACTURE SURGERY    . INCISION AND DRAINAGE OF WOUND Left 05/19/2019   Procedure: DEBRIDEMENT LEFT GROIN;  Surgeon: Georganna Skeans, MD;  Location: [place];  Service: General;  Laterality: Left;  . INSERTION OF ILIAC STENT Left 11/30/2017   Left external illiac stent  . INSERTION OF ILIAC STENT  11/30/2017   Procedure: Insertion Of Iliac Stent;  Surgeon: Lorretta Harp, MD;  Location: [place] CV LAB;  Service: Cardiovascular;;  Left external illiac stent  . KNEE ARTHROSCOPY Left   . KNEE SURGERY     "ligaments, cartilage; tendon, put a pin in" (11/30/2017)  . LEFT HEART CATH Bilateral 07/08/2012   Procedure: LEFT HEART CATH;  Surgeon: [name], MD;  Location: [facility] CATH LAB;  Service: Cardiovascular;  Laterality: Bilateral;  . LEFT HEART CATH AND CORONARY ANGIOGRAPHY N/A 10/16/2018   Procedure: LEFT HEART CATH AND CORONARY ANGIOGRAPHY;  Surgeon: Burnell Blanks, MD;  Location: [place] CV LAB;  Service: Cardiovascular;  Laterality: N/A;  . LEFT HEART CATH AND CORONARY ANGIOGRAPHY N/A 03/11/2019   Procedure: LEFT HEART CATH AND CORONARY ANGIOGRAPHY;  Surgeon: [name], MD;  Location: [place] CV LAB;  Service: Cardiovascular;  Laterality: N/A;  . LEFT HEART CATHETERIZATION WITH CORONARY ANGIOGRAM N/A 09/06/2013   STEMI, 2nd ISR LAD. Procedure: LEFT HEART CATHETERIZATION WITH CORONARY ANGIOGRAM;  Surgeon: [name], MD;  Location: [facility] CATH LAB;  Service: Cardiovascular;  Laterality: N/A;  . LOWER EXTREMITY ANGIOGRAPHY N/A 11/30/2017   Procedure: LOWER EXTREMITY ANGIOGRAPHY;  Surgeon: Lorretta Harp, MD;  Location: [place] CV  LAB;  Service: Cardiovascular;  Laterality: N/A;  . PERCUTANEOUS CORONARY STENT INTERVENTION (PCI-S)  07/08/2012   Procedure: PERCUTANEOUS CORONARY STENT INTERVENTION (PCI-S);  Surgeon: [name], MD;  Location: [facility] CATH LAB;  Service: Cardiovascular;;  DES LAD  . PERCUTANEOUS CORONARY STENT INTERVENTION (PCI-S) N/A 09/06/2013   Procedure: PERCUTANEOUS CORONARY STENT INTERVENTION (PCI-S);  Surgeon: [name], MD;  Location: [facility] CATH LAB;  Service: Cardiovascular;  Laterality: N/A;  Mid LAD 3.0/24mm Promus  . WOUND DEBRIDEMENT Left 05/20/2019   Procedure: DEBRIDEMENT GROIN;  Surgeon: Kinsinger, Arta Bruce, MD;  Location: [place];  Service: General;  Laterality: Left;  . WRIST FRACTURE SURGERY Bilateral        Family History  Problem Relation Age of Onset  . Hypertension Mother   . Diabetes Mother     Social History   Tobacco Use  . Smoking status: Former Smoker    Packs/day: 0.50    Years: 48.00    Pack years: 24.00    Types: Cigarettes  . Smokeless tobacco: Never Used  Vaping Use  . Vaping Use: Never  used  Substance Use Topics  . Alcohol use: Yes    Comment: 11/30/2017 "might drink a beer q 6 months"  . Drug use: Yes    Types: Cocaine    Home Medications Prior to Admission medications   Medication Sig Start Date End Date Taking? Authorizing Provider  acetaminophen (TYLENOL) 325 MG tablet Take 2 tablets (650 mg total) by mouth every 6 (six) hours as needed for mild pain (or Fever >/= 101). 05/24/19   Debbe Odea, MD  alum & mag hydroxide-simeth (MAALOX/MYLANTA) 200-200-20 MG/5ML suspension Take 30 mLs by mouth every 6 (six) hours as needed for indigestion or heartburn. 05/24/19   Debbe Odea, MD  Amino Acids-Protein Hydrolys (FEEDING SUPPLEMENT, PRO-STAT SUGAR FREE 64,) LIQD Take 30 mLs by mouth 3 (three) times daily. 05/24/19   Debbe Odea, MD  aspirin EC 81 MG tablet Take 1 tablet (81 mg total) by mouth daily. 10/17/18 10/17/19  Daune Perch, NP   clopidogrel (PLAVIX) 75 MG tablet Take 1 tablet (75 mg total) by mouth daily with breakfast. 02/06/19 02/01/20  Strader, Fransisco Hertz, PA-C  docusate sodium (COLACE) 100 MG capsule Take 1 capsule (100 mg total) by mouth 2 (two) times daily. 05/24/19   Debbe Odea, MD  empagliflozin (JARDIANCE) 25 MG TABS tablet Take 12.5 mg by mouth daily.    [provider]  gabapentin (NEURONTIN) 300 MG capsule Take 300 mg by mouth 3 (three) times daily.    [provider]  insulin aspart (NOVOLOG FLEXPEN) 100 UNIT/ML FlexPen Inject 1-15 Units into the skin 3 (three) times daily with meals. CBG 121 - 150: 2 units  CBG 151 - 200: 3 units  CBG 201 - 250: 5 units  CBG 251 - 300: 8 units  CBG 301 - 350: 11 units  CBG 351 - 400: 15 units 05/24/19   Debbe Odea, MD  insulin glargine (LANTUS) 100 UNIT/ML injection Inject 0.18 mLs (18 Units total) into the skin daily. 05/27/19   Debbe Odea, MD  Menthol-Methyl Salicylate (MUSCLE RUB) 10-15 % CREA Apply 1 application topically 4 (four) times daily. Apply to medial left ankle QID 05/24/19   Debbe Odea, MD  metFORMIN (GLUCOPHAGE) 1000 MG tablet Take 1 tablet (1,000 mg total) by mouth 2 (two) times daily with a meal. Do not restart Metformin until Wednesday am 7/2 Patient taking differently: Take 1,000 mg by mouth 2 (two) times daily with a meal.  07/10/12   Turner, Eber Hong, MD  metoprolol succinate (TOPROL-XL) 25 MG 24 hr tablet Take 0.5 tablets (12.5 mg total) by mouth daily. 05/24/19 05/23/20  Debbe Odea, MD  Multiple Vitamin (MULTIVITAMIN WITH MINERALS) TABS tablet Take 1 tablet by mouth daily. 05/24/19   Debbe Odea, MD  nitroGLYCERIN (NITROSTAT) 0.4 MG SL tablet Place 1 tablet (0.4 mg total) under the tongue every 5 (five) minutes x 3 doses as needed for chest pain. 10/05/18   Herminio Commons, MD  oxyCODONE (OXY IR/ROXICODONE) 5 MG immediate release tablet Take 1-2 tablets (5-10 mg total) by mouth every 6 (six) hours as needed for moderate pain  or severe pain (5mg  for moderate pain, 10mg  for severe pain- give 10mg  30-45 prior to dressing changes). 05/24/19   Debbe Odea, MD  pantoprazole (PROTONIX) 40 MG tablet Take 1 tablet (40 mg total) by mouth daily. 02/06/19 02/01/20  Strader, Fransisco Hertz, PA-C  polyethylene glycol (MIRALAX / GLYCOLAX) 17 g packet Take 17 g by mouth daily. 05/24/19   Debbe Odea, MD  pravastatin (PRAVACHOL) 80 MG  tablet Take 1 tablet (80 mg total) by mouth daily at 6 PM. 10/17/18 10/12/19  Daune Perch, NP  rivaroxaban (XARELTO) 20 MG TABS tablet Take 1 tablet (20 mg total) by mouth daily with breakfast. 05/24/19   Debbe Odea, MD  simethicone (MYLICON) 80 MG chewable tablet Chew 1 tablet (80 mg total) by mouth 4 (four) times daily as needed for flatulence. 05/24/19   Debbe Odea, MD    Allergies    Patient has no known allergies.  Review of Systems   Review of Systems All systems reviewed and negative, other than as noted in HPI.  Physical Exam Updated Vital Signs BP (!) 111/99 (BP Location: Left Arm)   Pulse 76   Temp 98.1 F (36.7 C) (Oral)   Resp 20   Ht 5\' 9"  (1.753 m)   Wt 94 kg   SpO2 100%   BMI 30.60 kg/m   Physical Exam Vitals and nursing note reviewed.  Constitutional:      General: He is not in acute distress.    Appearance: He is well-developed.  HENT:     Head: Normocephalic and atraumatic.  Eyes:     General:        Right eye: No discharge.        Left eye: No discharge.     Conjunctiva/sclera: Conjunctivae normal.  Cardiovascular:     Rate and Rhythm: Normal rate and regular rhythm.     Heart sounds: Normal heart sounds. No murmur heard.  No friction rub. No gallop.   Pulmonary:     Effort: Pulmonary effort is normal. No respiratory distress.     Breath sounds: Normal breath sounds.  Abdominal:     General: There is no distension.     Palpations: Abdomen is soft.     Tenderness: There is no abdominal tenderness.  Musculoskeletal:     Cervical back: Neck supple.      Comments: Along the L inguinal inguinal crease there is a scar with some granulation tissue.  Mild persistent superficial bleeding.  Skin:    General: Skin is warm and dry.  Neurological:     Mental Status: He is alert.  Psychiatric:        Behavior: Behavior normal.        Thought Content: Thought content normal.     ED Results / Procedures / Treatments   Labs (all labs ordered are listed, but only abnormal results are displayed) Labs Reviewed - No data to display  EKG None  Radiology No results found.  Procedures Procedures (including critical care time)  Medications Ordered in ED Medications  silver nitrate applicators applicator 2 Stick (has no administration in time range)  lidocaine-EPINEPHrine-tetracaine (LET) topical gel (3 mLs Topical Given 07/29/19 2151)  lidocaine-EPINEPHrine (XYLOCAINE W/EPI) 2 %-1:200000 (PF) injection 20 mL (20 mLs Infiltration Given by Other 07/29/19 2150)    ED Course  I have reviewed the triage vital signs and the nursing notes.  Pertinent labs & imaging results that were available during my care of the patient were reviewed by me and considered in my medical decision making (see chart for details).    MDM Rules/Calculators/A&P                          64yM with bleeding from excoriated areas along L thigh wound/scar. He is on xarelto. Initially injected lidocaine with epinephrine. This didn't really help and he began oozing from injections sites as well. LET  then applied. This significantly slowed bleeding. Two areas then cauterized with silver nitrate with good hemostasis. Bandage applied. Advised to keep this on overnight. Should be fine by the morning.   Final Clinical Impression(s) / ED Diagnoses Final diagnoses:  Bleeding from left hip wound, initial encounter    Rx / DC Orders ED Discharge Orders    None       Virgel Manifold, MD 08/04/19 1520

## 2019-08-12 DIAGNOSIS — Z5189 Encounter for other specified aftercare: Secondary | ICD-10-CM | POA: Diagnosis not present

## 2019-08-26 ENCOUNTER — Telehealth: Payer: Self-pay

## 2019-08-26 NOTE — Telephone Encounter (Signed)
NOTES ON FILE FROM  [facility] [phone] SENT REFERRAL TO SCHEDULING

## 2019-09-12 ENCOUNTER — Encounter: Payer: Self-pay | Admitting: Family Medicine

## 2019-10-14 ENCOUNTER — Encounter: Payer: Self-pay | Admitting: Cardiology

## 2019-10-14 NOTE — Progress Notes (Deleted)
Cardiology Office Note  Date: 10/14/2019   ID: [name], [name] [birth year], MRN [id number]  PCP:  Pcp, No  Cardiologist:  Rozann Lesches, MD Electrophysiologist:  None   No chief complaint on file.   History of Present Illness: [name] is a medically complex 64 y.o. male former patient of Dr. [name] now presenting to establish follow-up with me.  I reviewed his records and updated the chart.  He was last seen in December 2020 by Ms. Strader PA-C.  He was hospitalized with NSTEMI back in March and at that time underwent DES intervention to the PDA and distal RCA.  LVEF was 30 to 35% range with evidence of possible apical thrombus.  Past Medical History:  Diagnosis Date   Bulging lumbar disc    CAD (coronary artery disease) [phone]   a. prior LAD stenting. b. s/p DES to [facility] 08/2015. c. 04/2016 Cardiac cath at [facility]. Patent stent in the PLAD and RCA. Diffuse dLAD, OM2, and  RPDA disease. d. DES to PDA and distal RCA 03/2019    Chronic lower back pain    CKD (chronic kidney disease), stage II    Cocaine abuse ([place])    DM2 (diabetes mellitus, type 2) (HCC)    Essential hypertension    GERD (gastroesophageal reflux disease)    Headache    History of pneumonia    Hyperlipidemia    Ischemic cardiomyopathy    LV (left ventricular) mural thrombus    Sleep apnea     Past Surgical History:  Procedure Laterality Date   APPENDECTOMY     CARDIAC CATHETERIZATION N/A 09/07/2015   Procedure: Left Heart Cath and Coronary Angiography;  Surgeon: Leonie Man, MD;  Location: [place] [facility];  Service: Cardiovascular;  Laterality: N/A;   CARDIAC CATHETERIZATION N/A 09/07/2015   Procedure: Coronary Stent Intervention;  Surgeon: Leonie Man, MD;  Location: [place] [facility];  Service: Cardiovascular;  Laterality: N/A;   CORONARY ANGIOGRAM  09/07/13   residual RCA and OM disease   CORONARY ANGIOPLASTY WITH STENT PLACEMENT     CORONARY  STENT INTERVENTION N/A 10/16/2018   Procedure: CORONARY STENT INTERVENTION;  Surgeon: Burnell Blanks, MD;  Location: [place] [facility];  Service: Cardiovascular;  Laterality: N/A;   CORONARY STENT INTERVENTION N/A 03/11/2019   Procedure: CORONARY STENT INTERVENTION;  Surgeon: [name], MD;  Location: [place] [facility];  Service: Cardiovascular;  Laterality: N/A;   FRACTURE SURGERY     INCISION AND DRAINAGE OF WOUND Left 05/19/2019   Procedure: DEBRIDEMENT LEFT GROIN;  Surgeon: Georganna Skeans, MD;  Location: [place];  Service: General;  Laterality: Left;   INSERTION OF ILIAC STENT Left 11/30/2017   Left external illiac stent   INSERTION OF ILIAC STENT  11/30/2017   Procedure: Insertion Of Iliac Stent;  Surgeon: Lorretta Harp, MD;  Location: [place] [facility];  Service: Cardiovascular;;  Left external illiac stent   KNEE ARTHROSCOPY Left    KNEE SURGERY     "ligaments, cartilage; tendon, put a pin in" (11/30/2017)   LEFT HEART CATH Bilateral 07/08/2012   Procedure: LEFT HEART CATH;  Surgeon: [name], MD;  Location: [facility] CATH LAB;  Service: Cardiovascular;  Laterality: Bilateral;   LEFT HEART CATH AND CORONARY ANGIOGRAPHY N/A 10/16/2018   Procedure: LEFT HEART CATH AND CORONARY ANGIOGRAPHY;  Surgeon: Burnell Blanks, MD;  Location: [place] [facility];  Service: Cardiovascular;  Laterality: N/A;  LEFT HEART CATH AND CORONARY ANGIOGRAPHY N/A 03/11/2019   Procedure: LEFT HEART CATH AND CORONARY ANGIOGRAPHY;  Surgeon: [name], MD;  Location: [place] [facility];  Service: Cardiovascular;  Laterality: N/A;   LEFT HEART CATHETERIZATION WITH CORONARY ANGIOGRAM N/A 09/06/2013   STEMI, 2nd ISR LAD. Procedure: LEFT HEART CATHETERIZATION WITH CORONARY ANGIOGRAM;  Surgeon: [name], MD;  Location: [facility] CATH LAB;  Service: Cardiovascular;  Laterality: N/A;   LOWER EXTREMITY ANGIOGRAPHY N/A 11/30/2017   Procedure: LOWER EXTREMITY ANGIOGRAPHY;   Surgeon: Lorretta Harp, MD;  Location: [place] [facility];  Service: Cardiovascular;  Laterality: N/A;   PERCUTANEOUS CORONARY STENT INTERVENTION (PCI-S)  07/08/2012   Procedure: PERCUTANEOUS CORONARY STENT INTERVENTION (PCI-S);  Surgeon: [name], MD;  Location: [facility] CATH LAB;  Service: Cardiovascular;;  DES LAD   PERCUTANEOUS CORONARY STENT INTERVENTION (PCI-S) N/A 09/06/2013   Procedure: PERCUTANEOUS CORONARY STENT INTERVENTION (PCI-S);  Surgeon: [name], MD;  Location: [facility] CATH LAB;  Service: Cardiovascular;  Laterality: N/A;  Mid LAD 3.0/24mm Promus   WOUND DEBRIDEMENT Left 05/20/2019   Procedure: DEBRIDEMENT GROIN;  Surgeon: Kinsinger, Arta Bruce, MD;  Location: [place];  Service: General;  Laterality: Left;   WRIST FRACTURE SURGERY Bilateral     Current Outpatient Medications  Medication Sig Dispense Refill   acetaminophen (TYLENOL) 325 MG tablet Take 2 tablets (650 mg total) by mouth every 6 (six) hours as needed for mild pain (or Fever >/= 101).     alum & mag hydroxide-simeth (MAALOX/MYLANTA) 200-200-20 MG/5ML suspension Take 30 mLs by mouth every 6 (six) hours as needed for indigestion or heartburn. 355 mL 0   Amino Acids-Protein Hydrolys (FEEDING SUPPLEMENT, PRO-STAT SUGAR FREE 64,) LIQD Take 30 mLs by mouth 3 (three) times daily. 887 mL 0   aspirin EC 81 MG tablet Take 1 tablet (81 mg total) by mouth daily. 90 tablet 3   clopidogrel (PLAVIX) 75 MG tablet Take 1 tablet (75 mg total) by mouth daily with breakfast. 90 tablet 3   docusate sodium (COLACE) 100 MG capsule Take 1 capsule (100 mg total) by mouth 2 (two) times daily. 10 capsule 0   empagliflozin (JARDIANCE) 25 MG TABS tablet Take 12.5 mg by mouth daily.     gabapentin (NEURONTIN) 300 MG capsule Take 300 mg by mouth 3 (three) times daily.     insulin aspart (NOVOLOG FLEXPEN) 100 UNIT/ML FlexPen Inject 1-15 Units into the skin 3 (three) times daily with meals. CBG 121 - 150: 2 units  CBG 151 -  200: 3 units  CBG 201 - 250: 5 units  CBG 251 - 300: 8 units  CBG 301 - 350: 11 units  CBG 351 - 400: 15 units 15 mL 11   insulin glargine (LANTUS) 100 UNIT/ML injection Inject 0.18 mLs (18 Units total) into the skin daily. 10 mL 11   Menthol-Methyl Salicylate (MUSCLE RUB) 10-15 % CREA Apply 1 application topically 4 (four) times daily. Apply to medial left ankle QID  0   metFORMIN (GLUCOPHAGE) 1000 MG tablet Take 1 tablet (1,000 mg total) by mouth 2 (two) times daily with a meal. Do not restart Metformin until Wednesday am 7/2 (Patient taking differently: Take 1,000 mg by mouth 2 (two) times daily with a meal. )     metoprolol succinate (TOPROL-XL) 25 MG 24 hr tablet Take 0.5 tablets (12.5 mg total) by mouth daily. 45 tablet 3   Multiple Vitamin (MULTIVITAMIN WITH MINERALS) TABS tablet Take 1 tablet by mouth  daily.     nitroGLYCERIN (NITROSTAT) 0.4 MG SL tablet Place 1 tablet (0.4 mg total) under the tongue every 5 (five) minutes x 3 doses as needed for chest pain. 25 tablet 3   oxyCODONE (OXY IR/ROXICODONE) 5 MG immediate release tablet Take 1-2 tablets (5-10 mg total) by mouth every 6 (six) hours as needed for moderate pain or severe pain (5mg  for moderate pain, 10mg  for severe pain- give 10mg  30-45 prior to dressing changes). 15 tablet 0   pantoprazole (PROTONIX) 40 MG tablet Take 1 tablet (40 mg total) by mouth daily. 90 tablet 3   polyethylene glycol (MIRALAX / GLYCOLAX) 17 g packet Take 17 g by mouth daily. 14 each 0   pravastatin (PRAVACHOL) 80 MG tablet Take 1 tablet (80 mg total) by mouth daily at 6 PM. 90 tablet 3   rivaroxaban (XARELTO) 20 MG TABS tablet Take 1 tablet (20 mg total) by mouth daily with breakfast. 30 tablet    simethicone (MYLICON) 80 MG chewable tablet Chew 1 tablet (80 mg total) by mouth 4 (four) times daily as needed for flatulence. 30 tablet 0   No current facility-administered medications for this visit.   Allergies:  Patient has no known allergies.    Social History: The patient  reports that he has quit smoking. His smoking use included cigarettes. He has a 24.00 pack-year smoking history. He has never used smokeless tobacco. He reports current alcohol use. He reports current drug use. Drug: Cocaine.   Family History: The patient's family history includes Diabetes in his mother; Hypertension in his mother.   ROS:  Please see the history of present illness. Otherwise, complete review of systems is positive for {NONE DEFAULTED:18576::"none"}.  All other systems are reviewed and negative.   Physical Exam: VS:  There were no vitals taken for this visit., BMI There is no height or weight on file to calculate BMI.  Wt Readings from Last 3 Encounters:  07/29/19 207 lb 3.7 oz (94 kg)  05/27/19 230 lb 2.6 oz (104.4 kg)  03/11/19 227 lb 4.7 oz (103.1 kg)    General: Patient appears comfortable at rest. HEENT: Conjunctiva and lids normal, oropharynx clear with moist mucosa. Neck: Supple, no elevated JVP or carotid bruits, no thyromegaly. Lungs: Clear to auscultation, nonlabored breathing at rest. Cardiac: Regular rate and rhythm, no S3 or significant systolic murmur, no pericardial rub. Abdomen: Soft, nontender, no hepatomegaly, bowel sounds present, no guarding or rebound. Extremities: No pitting edema, distal pulses 2+. Skin: Warm and dry. Musculoskeletal: No kyphosis. Neuropsychiatric: Alert and oriented x3, affect grossly appropriate.  ECG:  An ECG dated 05/18/2019 was personally reviewed today and demonstrated:  Sinus rhythm with PVCs, old inferior infarct pattern, old anterolateral infarct pattern, nonspecific T wave changes.  Recent Labwork: 10/15/2018: TSH 2.759 10/21/2018: B Natriuretic Peptide 142.0 05/20/2019: Magnesium 2.0 05/22/2019: ALT 10; AST 16; BUN 16; Creatinine, Ser 1.24; Potassium 3.6; Sodium 134 05/27/2019: Hemoglobin 9.4; Platelets 497     Component Value Date/Time   CHOL 161 10/16/2018 0941   TRIG 117 10/16/2018  0941   HDL 24 (L) 10/16/2018 0941   CHOLHDL 6.7 10/16/2018 0941   VLDL 23 10/16/2018 0941   LDLCALC 114 (H) 10/16/2018 0941    Other Studies Reviewed Today:  Cardiac catheterization 03/11/2019:  Colon Flattery 2nd Diag lesion is 80% stenosed.  Prox LAD to Mid LAD lesion is 10% stenosed.  Prox LAD lesion is 40% stenosed.  Dist LAD-1 lesion is 75% stenosed.  Dist LAD-2 lesion is 99%  stenosed.  Prior LAD stent is patent.  Previously placed Ramus drug eluting stent is widely patent.  1st Mrg-2 lesion is 90% stenosed.  1st Mrg-1 lesion is 99% stenosed.  Mid Cx to Dist Cx lesion is 90% stenosed. Diffuse small vessel disease.  Dist RCA lesion is 90% stenosed. RPDA lesion is 90% stenosed.  A drug-eluting stent was successfully placed using a SYNERGY XD 2.50X28 postdilated to 3.25 mm ditally in teh PDA A drug-eluting stent was successfully placed using a SYNERGY XD 3.0X28., intot the ostial PDA and in the distal RCA.  The stents overlap. Post intervention, there is a 0% residual stenosis.  Prox RCA lesion is 40% stenosed.  Mid RCA lesion is 10% stenosed.  LV end diastolic pressure is moderately elevated.  There is no aortic valve stenosis.  RPDA lesion is 90% stenosed.  Post intervention, there is a 0% residual stenosis.  A drug-eluting stent was successfully placed using a SYNERGY XD 3.0X28.   Diffuse distal LAD disease, not amenable to PCI.  Diffuse circumflex, OM disease- unchanged from prior.  Successful PCI of the PDA and the distal RCA.    Echocardiogram 03/11/2019: 1. There is apical akinesis. Definity contrast reveals an apical filling  defect that is consistent with thrombus / early thrombus formation . Marland Kitchen  Left ventricular ejection fraction, by estimation, is 30 to 35%. The left  ventricle has moderately decreased  function. The left ventricle demonstrates regional wall motion  abnormalities (see scoring diagram/findings for description). Left  ventricular diastolic  parameters are consistent with Grade I diastolic  dysfunction (impaired relaxation). There is severe  akinesis of the left ventricular, mid-apical anteroseptal wall, lateral  wall and inferior wall.  2. Right ventricular systolic function is normal. The right ventricular  size is normal.  3. The mitral valve is normal in structure and function. No evidence of  mitral valve regurgitation. No evidence of mitral stenosis.  4. The aortic valve is grossly normal. Aortic valve regurgitation is not  visualized. No aortic stenosis is present.   Assessment and Plan:    Medication Adjustments/Labs and Tests Ordered: Current medicines are reviewed at length with the patient today.  Concerns regarding medicines are outlined above.   Tests Ordered: No orders of the defined types were placed in this encounter.   Medication Changes: No orders of the defined types were placed in this encounter.   Disposition:  Follow up {follow up:15908}  Signed, Satira Sark, MD, [facility] 10/14/2019 2:03 PM    [facility] [place], [place], [place] [address] Phone: [phone]; Fax: [phone]

## 2019-10-15 ENCOUNTER — Ambulatory Visit: Payer: Medicare Other | Admitting: Cardiology

## 2020-08-26 ENCOUNTER — Other Ambulatory Visit (HOSPITAL_COMMUNITY): Payer: Self-pay | Admitting: Internal Medicine

## 2020-08-26 DIAGNOSIS — R609 Edema, unspecified: Secondary | ICD-10-CM

## 2020-08-31 ENCOUNTER — Ambulatory Visit (HOSPITAL_COMMUNITY)
Admission: RE | Admit: 2020-08-31 | Discharge: 2020-08-31 | Disposition: A | Discharge status: Home / Self Care | Payer: No Typology Code available for payment source | Source: Ambulatory Visit | Attending: Internal Medicine | Admitting: Internal Medicine

## 2020-08-31 ENCOUNTER — Other Ambulatory Visit: Payer: Self-pay

## 2020-08-31 DIAGNOSIS — I700 Atherosclerosis of aorta: Secondary | ICD-10-CM | POA: Insufficient documentation

## 2020-08-31 DIAGNOSIS — I10 Essential (primary) hypertension: Secondary | ICD-10-CM | POA: Diagnosis not present

## 2020-08-31 DIAGNOSIS — R601 Generalized edema: Secondary | ICD-10-CM | POA: Diagnosis not present

## 2020-08-31 DIAGNOSIS — I081 Rheumatic disorders of both mitral and tricuspid valves: Secondary | ICD-10-CM | POA: Insufficient documentation

## 2020-08-31 DIAGNOSIS — R609 Edema, unspecified: Secondary | ICD-10-CM | POA: Insufficient documentation

## 2020-08-31 DIAGNOSIS — E119 Type 2 diabetes mellitus without complications: Secondary | ICD-10-CM | POA: Diagnosis not present

## 2020-08-31 DIAGNOSIS — E785 Hyperlipidemia, unspecified: Secondary | ICD-10-CM | POA: Diagnosis not present

## 2020-08-31 LAB — ECHOCARDIOGRAM COMPLETE
Calc EF: 27.2 %
S' Lateral: 4.9 cm
Single Plane A2C EF: 29.7 %
Single Plane A4C EF: 23.4 %

## 2020-08-31 MED ORDER — PERFLUTREN LIPID MICROSPHERE
1.0000 mL | INTRAVENOUS | Status: AC | PRN
  Administered 2020-08-31: 2 mL via INTRAVENOUS
  Filled 2020-08-31: qty 10

## 2020-12-07 ENCOUNTER — Telehealth: Payer: Self-pay | Admitting: Cardiovascular Disease

## 2020-12-07 NOTE — Telephone Encounter (Signed)
[name] is calling wanting to know if it is possible for him to get a drug test at his appt on 12/11/20 to be sent to [facility]. He states he is needing pain medication and he is due for a drug test before it can be given to him. Due to this he was hoping it could be done when he comes in instead of having to find a ride to the [place].

## 2020-12-07 NOTE — Telephone Encounter (Signed)
I explained to [name] that we do not do drug testing and that he should speak with his pain management team at the [place]. He agrees he will speak with them.

## 2020-12-10 NOTE — Progress Notes (Signed)
Cardiology Office Note:   Date:  12/11/2020  NAME:  [name]    MRN: [id number] DOB:  11-20-1955   PCP:  [facility] [place]  Cardiologist:  Rozann Lesches, MD  Electrophysiologist:  None   Referring MD: No ref. provider found   Chief Complaint  Patient presents with   Congestive Heart Failure   History of Present Illness:   [name] is a 65 y.o. male with a hx of CAD, CHF, PAD, DM, HLD, HTN, CKD3 who is being seen today for the evaluation of CHF at the request of the [place]. he was admitted to [facility] in March 2021 with non-STEMI.  He was found to have diffuse distal CAD.  He underwent PCI to the RCA and PDA.  He was found to have 90% distal circumflex disease as well as 99% distal LAD disease that was not amendable to PCI.  Echocardiogram at that time demonstrated moderately reduced EF at 30-35%.  He apparently has not followed with cardiology since that time.  He is also stopped taking all of his medications.  He follows with the [facility].  He reports recent admission in May of last year for necrotizing fasciitis of what appears to be Fournier's gangrene.  He is recovered from that.  There has been a history of apical mural thrombus.  He stopped taking his Xarelto.  He also has diabetes but is not taking any of his medications for this either.  Surprisingly he is euvolemic on exam today.  He reports no chest pain or trouble breathing.  He does have an aide at home who helps him.  This has been provided by the [place].  He is still smoking up to 1 pack/day.  He reports he is not interested in quitting.  We discussed getting back on his cardiac medications and he seems amendable to this.  He did obtain an echocardiogram in August of this year which shows his EF is further reduced at 20-25%.  We do not know what his kidney function is so adding certain medications may be problematic.  We discussed starting back some and checking a full panel of labs.  He is okay to do  this.  We will then likely add back cardiac medications as we are able.  He reports no drug use.  Ongoing tobacco use is an issue.  No alcohol use.  Unclear how bad his diabetes is at this time.  He reports he would like to follow with [place] moving forward for his cardiac care.  He has issues with the [facility] cardiologists.  Problem List CAD -PCI to dRCA/PDA 03/2019 -90% distal LCX -99% dLAD 2. Systolic HF -EF 23-55% 3. PAD 4. DM 5. HLD 6. HTN 7. CKD III 8.  Tobacco abuse  Past Medical History: Past Medical History:  Diagnosis Date   Bulging lumbar disc    CAD (coronary artery disease) [phone]   a. prior LAD stenting. b. s/p DES to [facility] 08/2015. c. 04/2016 Cardiac cath at [facility]. Patent stent in the PLAD and RCA. Diffuse dLAD, OM2, and  RPDA disease. d. DES to PDA and distal RCA 03/2019    Chronic lower back pain    CKD (chronic kidney disease), stage II    Cocaine abuse ([place])    DM2 (diabetes mellitus, type 2) (HCC)    Essential hypertension    GERD (gastroesophageal reflux disease)    Headache    History of pneumonia  Hyperlipidemia    Ischemic cardiomyopathy    LV (left ventricular) mural thrombus    Sleep apnea     Past Surgical History: Past Surgical History:  Procedure Laterality Date   APPENDECTOMY     CARDIAC CATHETERIZATION N/A 09/07/2015   Procedure: Left Heart Cath and Coronary Angiography;  Surgeon: Leonie Man, MD;  Location: [place] CV LAB;  Service: Cardiovascular;  Laterality: N/A;   CARDIAC CATHETERIZATION N/A 09/07/2015   Procedure: Coronary Stent Intervention;  Surgeon: Leonie Man, MD;  Location: [place] CV LAB;  Service: Cardiovascular;  Laterality: N/A;   CORONARY ANGIOGRAM  09/07/13   residual RCA and OM disease   CORONARY ANGIOPLASTY WITH STENT PLACEMENT     CORONARY STENT INTERVENTION N/A 10/16/2018   Procedure: CORONARY STENT INTERVENTION;  Surgeon: Burnell Blanks, MD;  Location: [place] CV LAB;  Service:  Cardiovascular;  Laterality: N/A;   CORONARY STENT INTERVENTION N/A 03/11/2019   Procedure: CORONARY STENT INTERVENTION;  Surgeon: [name], MD;  Location: [place] CV LAB;  Service: Cardiovascular;  Laterality: N/A;   FRACTURE SURGERY     INCISION AND DRAINAGE OF WOUND Left 05/19/2019   Procedure: DEBRIDEMENT LEFT GROIN;  Surgeon: Georganna Skeans, MD;  Location: [place];  Service: General;  Laterality: Left;   INSERTION OF ILIAC STENT Left 11/30/2017   Left external illiac stent   INSERTION OF ILIAC STENT  11/30/2017   Procedure: Insertion Of Iliac Stent;  Surgeon: Lorretta Harp, MD;  Location: [place] CV LAB;  Service: Cardiovascular;;  Left external illiac stent   KNEE ARTHROSCOPY Left    KNEE SURGERY     "ligaments, cartilage; tendon, put a pin in" (11/30/2017)   LEFT HEART CATH Bilateral 07/08/2012   Procedure: LEFT HEART CATH;  Surgeon: [name], MD;  Location: [facility] CATH LAB;  Service: Cardiovascular;  Laterality: Bilateral;   LEFT HEART CATH AND CORONARY ANGIOGRAPHY N/A 10/16/2018   Procedure: LEFT HEART CATH AND CORONARY ANGIOGRAPHY;  Surgeon: Burnell Blanks, MD;  Location: [place] CV LAB;  Service: Cardiovascular;  Laterality: N/A;   LEFT HEART CATH AND CORONARY ANGIOGRAPHY N/A 03/11/2019   Procedure: LEFT HEART CATH AND CORONARY ANGIOGRAPHY;  Surgeon: [name], MD;  Location: [place] CV LAB;  Service: Cardiovascular;  Laterality: N/A;   LEFT HEART CATHETERIZATION WITH CORONARY ANGIOGRAM N/A 09/06/2013   STEMI, 2nd ISR LAD. Procedure: LEFT HEART CATHETERIZATION WITH CORONARY ANGIOGRAM;  Surgeon: [name], MD;  Location: [facility] CATH LAB;  Service: Cardiovascular;  Laterality: N/A;   LOWER EXTREMITY ANGIOGRAPHY N/A 11/30/2017   Procedure: LOWER EXTREMITY ANGIOGRAPHY;  Surgeon: Lorretta Harp, MD;  Location: [place] CV LAB;  Service: Cardiovascular;  Laterality: N/A;   PERCUTANEOUS CORONARY STENT INTERVENTION (PCI-S)   07/08/2012   Procedure: PERCUTANEOUS CORONARY STENT INTERVENTION (PCI-S);  Surgeon: [name], MD;  Location: [facility] CATH LAB;  Service: Cardiovascular;;  DES LAD   PERCUTANEOUS CORONARY STENT INTERVENTION (PCI-S) N/A 09/06/2013   Procedure: PERCUTANEOUS CORONARY STENT INTERVENTION (PCI-S);  Surgeon: [name], MD;  Location: [place] CATH LAB;  Service: Cardiovascular;  Laterality: N/A;  Mid LAD 3.0/24mm Promus   WOUND DEBRIDEMENT Left 05/20/2019   Procedure: DEBRIDEMENT GROIN;  Surgeon: Kinsinger, Arta Bruce, MD;  Location: [place];  Service: General;  Laterality: Left;   WRIST FRACTURE SURGERY Bilateral     Current Medications: Current Meds  Medication Sig   aspirin EC 81 MG tablet Take 1 tablet (81 mg total) by  mouth daily. Swallow whole.   atorvastatin (LIPITOR) 40 MG tablet Take 1 tablet (40 mg total) by mouth daily.   gabapentin (NEURONTIN) 300 MG capsule Take 300 mg by mouth 3 (three) times daily.   metoprolol succinate (TOPROL XL) 25 MG 24 hr tablet Take 1 tablet (25 mg total) by mouth daily.   rivaroxaban (XARELTO) 20 MG TABS tablet Take 1 tablet (20 mg total) by mouth daily with supper.   sildenafil (VIAGRA) 25 MG tablet Take 1 tablet (25 mg total) by mouth daily as needed for erectile dysfunction.     Allergies:    Patient has no known allergies.   Social History: Social History   Socioeconomic History   Marital status: Divorced    Spouse name: Not on file   Number of children: 3   Years of education: Not on file   Highest education level: Not on file  Occupational History   Not on file  Tobacco Use   Smoking status: Every Day    Packs/day: 0.50    Years: 48.00    Pack years: 24.00    Types: Cigarettes   Smokeless tobacco: Never  Vaping Use   Vaping Use: Never used  Substance and Sexual Activity   Alcohol use: Not Currently    Comment: 11/30/2017 "might drink a beer q 6 months"   Drug use: Never   Sexual activity: Not on file  Other Topics Concern    Not on file  Social History Narrative   ** Merged History Encounter **       Social Determinants of Health   Financial Resource Strain: Not on file  Food Insecurity: Not on file  Transportation Needs: Not on file  Physical Activity: Not on file  Stress: Not on file  Social Connections: Not on file     Family History: The patient's family history includes Diabetes in his mother; Hypertension in his mother.  ROS:   All other ROS reviewed and negative. Pertinent positives noted in the HPI.     EKGs/Labs/Other Studies Reviewed:   The following studies were personally reviewed by me today:  EKG:  EKG is ordered today.  The ekg ordered today demonstrates normal sinus rhythm heart rate 87, inferior infarct, anterior lateral infarct, and was personally reviewed by me.   TTE 08/31/2020  1. There is swirling, slow flowing contrast at the LV apex, and a  discreet apical-lateral echo density which may represent thrombus. On  review of prior CTA chest from 10/2018, this may represent prominent  trabeculations vs low inserting papillary muscle   vs thrombus in setting of apical akinesis. Consider cardiac MRI to define  this region further.   2. Left ventricular ejection fraction, by estimation, is 20 to 25%. The  left ventricle has severely decreased function. The left ventricle  demonstrates global hypokinesis with apical akinesis. The left ventricular  internal cavity size was mildly  dilated. There is mild left ventricular hypertrophy. Left ventricular  diastolic parameters are indeterminate.   3. Right ventricular systolic function is moderately reduced. The right  ventricular size is mildly enlarged. There is moderately elevated  pulmonary artery systolic pressure. The estimated right ventricular  systolic pressure is 57.8 mmHg.   4. Left atrial size was mildly dilated.   5. The mitral valve is grossly normal. Mild to moderate mitral valve  regurgitation. No evidence of mitral  stenosis.   6. The aortic valve is grossly normal. There is mild calcification of the  aortic valve. Aortic valve  regurgitation is not visualized. No aortic  stenosis is present.   7. The inferior vena cava is dilated in size with <50% respiratory  variability, suggesting right atrial pressure of 15 mmHg.   LHC 03/11/2019 Ost 2nd Diag lesion is 80% stenosed. Prox LAD to Mid LAD lesion is 10% stenosed. Prox LAD lesion is 40% stenosed. Dist LAD-1 lesion is 75% stenosed. Dist LAD-2 lesion is 99% stenosed. Prior LAD stent is patent. Previously placed Ramus drug eluting stent is widely patent. 1st Mrg-2 lesion is 90% stenosed. 1st Mrg-1 lesion is 99% stenosed. Mid Cx to Dist Cx lesion is 90% stenosed. Diffuse small vessel disease. Dist RCA lesion is 90% stenosed. RPDA lesion is 90% stenosed. A drug-eluting stent was successfully placed using a SYNERGY XD 2.50X28 postdilated to 3.25 mm ditally in teh PDA A drug-eluting stent was successfully placed using a SYNERGY XD 3.0X28., intot the ostial PDA and in the distal RCA. The stents overlap. Post intervention, there is a 0% residual stenosis. Prox RCA lesion is 40% stenosed. Mid RCA lesion is 10% stenosed. LV end diastolic pressure is moderately elevated. There is no aortic valve stenosis. RPDA lesion is 90% stenosed. Post intervention, there is a 0% residual stenosis. A drug-eluting stent was successfully placed using a SYNERGY XD 3.0X28.  Recent Labs: No results found for requested labs within last 8760 hours.   Recent Lipid Panel    Component Value Date/Time   CHOL 161 10/16/2018 0941   TRIG 117 10/16/2018 0941   HDL 24 (L) 10/16/2018 0941   CHOLHDL 6.7 10/16/2018 0941   VLDL 23 10/16/2018 0941   LDLCALC 114 (H) 10/16/2018 0941    Physical Exam:   VS:  BP 126/76   Pulse 86   Ht 5\' 9"  (1.753 m)   Wt 239 lb (108.4 kg)   SpO2 94%   BMI 35.29 kg/m    Wt Readings from Last 3 Encounters:  12/11/20 239 lb (108.4 kg)  07/29/19  207 lb 3.7 oz (94 kg)  05/27/19 230 lb 2.6 oz (104.4 kg)    General: Well nourished, well developed, in no acute distress Head: Atraumatic, normal size  Eyes: PEERLA, EOMI  Neck: Supple, no JVD Endocrine: No thryomegaly Cardiac: Normal S1, S2; RRR; no murmurs, rubs, or gallops Lungs: Clear to auscultation bilaterally, no wheezing, rhonchi or rales  Abd: Soft, nontender, no hepatomegaly  Ext: No edema, pulses 2+ Musculoskeletal: No deformities, BUE and BLE strength normal and equal Skin: Warm and dry, no rashes   Neuro: Alert and oriented to person, place, time, and situation, CNII-XII grossly intact, no focal deficits  Psych: Normal mood and affect   ASSESSMENT:   [name] is a 65 y.o. male who presents for the following: 1. Coronary artery disease involving native coronary artery of native heart without angina pectoris   2. Chronic systolic heart failure (HCC)   3. Apical mural thrombus   4. Mixed hyperlipidemia   5. Primary hypertension   6. Tobacco abuse   7. Erectile dysfunction, unspecified erectile dysfunction type   8. Uncontrolled type 2 diabetes mellitus with hyperglycemia ([place])     PLAN:   1. Coronary artery disease involving native coronary artery of native heart without angina pectoris -History of non-STEMI in March 2021.  He underwent PCI to the distal RCA and PDA. -He has residual disease that is obstructive in the distal circumflex as well as distal LAD. -He also has an ischemic cardiomyopathy with a EF of 20-25% with regional wall  motion normalities. -He has stopped taking all of his medications.  We discussed checking full panel of labs and getting back on medications.  He is willing to do this.  We will check a CMP, CBC, A1c and lipid profile. -We will restart aspirin 81 mg daily, Lipitor 40 mg daily.  We will also add back metoprolol succinate 25 mg daily.  He will need to be back on an ACE/ARB/Arni/MRA but we will await his kidney profile before we do  this.  He had at least CKD stage III on most recent labs. -He is without symptoms of angina.  I have recommended to continue with medical therapy for now.  See discussion of heart failure below.  2. Chronic systolic heart failure (HCC) -EF 20-25% with regional wall motion abnormalities.  Concerns for LV apical thrombus with very slow flow in the apex. -He surprisingly is euvolemic.  His EF is low and he is not on medications.  We will restart him on metoprolol succinate 25 mg daily.  I would like to check his kidney function before we add ACE/ARB/Arni/MRA.  He may not be a candidate for this depending on his kidney function. -He is euvolemic.  We will hold on Lasix. -Given recent admission last year with necrotizing fasciitis due to Fournier's gangrene I believe he would not be a good candidate for SGLT2 inhibitor. -His most recent echo shows EF 20-25%.  He has been on no medications.  Compliance has been an issue in the past.  We will have to get him on medical therapy for at least 3 to 6 months and recheck his echo before we consider ICD.  Again given poor compliance he may not be a candidate for ICD.  3. Apical mural thrombus -History of apical mural thrombus in the past.  Most recent echo shows sluggish flow in the apex.  I think the best option for him is continued Xarelto therapy.  We will add back 20 mg daily.  Dose may need to be adjusted based on kidney profile.  4. Mixed hyperlipidemia -CAD and diabetes.  We will recheck A1c and lipid profile.  Regardless add back Lipitor 40 mg daily.  5. Primary hypertension -Blood pressure controlled today.  Worrisome finding in the setting of low EF.  Add back metoprolol succinate.  Other agents as we are able.  6. Tobacco abuse -Still smoking 1 pack/day.  4 minutes of smoking cessation counseling was provided in office today.  Still not ready to quit.  7. Erectile dysfunction, unspecified erectile dysfunction type -He does request refill on  Viagra's.  He is not on nitroglycerin or agents that would be bothersome.  I think this is reasonable for now.  8. Uncontrolled type 2 diabetes mellitus with hyperglycemia (HCC) -Recheck A1c.  He does need to go back to the [place] to get back on diabetic medications.  Disposition: Return in about 3 months (around 03/11/2021).  Medication Adjustments/Labs and Tests Ordered: Current medicines are reviewed at length with the patient today.  Concerns regarding medicines are outlined above.  Orders Placed This Encounter  Procedures   Comprehensive Metabolic Panel (CMET)   HgB A1c   CBC   Lipid panel   EKG 12-Lead   Meds ordered this encounter  Medications   metoprolol succinate (TOPROL XL) 25 MG 24 hr tablet    Sig: Take 1 tablet (25 mg total) by mouth daily.    Dispense:  90 tablet    Refill:  3   aspirin EC 81  MG tablet    Sig: Take 1 tablet (81 mg total) by mouth daily. Swallow whole.    Dispense:  90 tablet    Refill:  3   rivaroxaban (XARELTO) 20 MG TABS tablet    Sig: Take 1 tablet (20 mg total) by mouth daily with supper.    Dispense:  90 tablet    Refill:  3   atorvastatin (LIPITOR) 40 MG tablet    Sig: Take 1 tablet (40 mg total) by mouth daily.    Dispense:  90 tablet    Refill:  3   sildenafil (VIAGRA) 25 MG tablet    Sig: Take 1 tablet (25 mg total) by mouth daily as needed for erectile dysfunction.    Dispense:  30 tablet    Refill:  2    Patient Instructions  Medication Instructions:   Start Toprol XL 25 mg Daily  Start Aspirin 81 mg Daily  Start Xarelto 20 mg Daily  Start Lipitor 40 mg Daily   *If you need a refill on your cardiac medications before your next appointment, please call your pharmacy*   Lab Work: Your physician recommends that you return for lab work in: Today ( CMET, A1C, CBC, Lipitor)   If you have labs (blood work) drawn today and your tests are completely normal, you will receive your results only by: [place] (if you have MyChart)  OR A paper copy in the mail If you have any lab test that is abnormal or we need to change your treatment, we will call you to review the results.   Testing/Procedures: NONE    Follow-Up: At [facility], you and your health needs are our priority.  As part of our continuing mission to provide you with exceptional heart care, we have created designated Provider Care Teams.  These Care Teams include your primary Cardiologist (physician) and Advanced Practice Providers (APPs -  Physician Assistants and Nurse Practitioners) who all work together to provide you with the care you need, when you need it.  We recommend signing up for the patient portal called "MyChart".  Sign up information is provided on this After Visit Summary.  MyChart is used to connect with patients for Virtual Visits (Telemedicine).  Patients are able to view lab/test results, encounter notes, upcoming appointments, etc.  Non-urgent messages can be sent to your provider as well.   To learn more about what you can do with MyChart, go to [email or web address].    Your next appointment:   3 month(s)  The format for your next appointment:   In Person  Provider:   Dr. Audie Box     Other Instructions Thank you for choosing [facility]!       Signed, Addison Naegeli. Audie Box, MD, [place]  [address], [place], [place] [address] [phone]  12/11/2020 11:59 AM

## 2020-12-11 ENCOUNTER — Other Ambulatory Visit (HOSPITAL_COMMUNITY)
Admission: RE | Admit: 2020-12-11 | Discharge: 2020-12-11 | Disposition: A | Discharge status: Home / Self Care | Payer: Medicare Other | Source: Ambulatory Visit | Attending: Cardiovascular Disease | Admitting: Cardiovascular Disease

## 2020-12-11 ENCOUNTER — Encounter: Payer: Self-pay | Admitting: Cardiovascular Disease

## 2020-12-11 ENCOUNTER — Ambulatory Visit (INDEPENDENT_AMBULATORY_CARE_PROVIDER_SITE_OTHER): Payer: No Typology Code available for payment source | Admitting: Cardiovascular Disease

## 2020-12-11 VITALS — BP 126/76 | HR 86 | Ht 69.0 in | Wt 239.0 lb

## 2020-12-11 DIAGNOSIS — F17210 Nicotine dependence, cigarettes, uncomplicated: Secondary | ICD-10-CM | POA: Diagnosis not present

## 2020-12-11 DIAGNOSIS — I10 Essential (primary) hypertension: Secondary | ICD-10-CM | POA: Diagnosis not present

## 2020-12-11 DIAGNOSIS — I513 Intracardiac thrombosis, not elsewhere classified: Secondary | ICD-10-CM

## 2020-12-11 DIAGNOSIS — E782 Mixed hyperlipidemia: Secondary | ICD-10-CM

## 2020-12-11 DIAGNOSIS — Z720 Tobacco use: Secondary | ICD-10-CM

## 2020-12-11 DIAGNOSIS — I5022 Chronic systolic (congestive) heart failure: Secondary | ICD-10-CM | POA: Diagnosis not present

## 2020-12-11 DIAGNOSIS — I2510 Atherosclerotic heart disease of native coronary artery without angina pectoris: Secondary | ICD-10-CM

## 2020-12-11 DIAGNOSIS — E1165 Type 2 diabetes mellitus with hyperglycemia: Secondary | ICD-10-CM

## 2020-12-11 DIAGNOSIS — N529 Male erectile dysfunction, unspecified: Secondary | ICD-10-CM

## 2020-12-11 LAB — COMPREHENSIVE METABOLIC PANEL
ALT: 21 U/L (ref 0–44)
AST: 19 U/L (ref 15–41)
Albumin: 3.3 g/dL — ABNORMAL LOW (ref 3.5–5.0)
Alkaline Phosphatase: 131 U/L — ABNORMAL HIGH (ref 38–126)
Anion gap: 5 (ref 5–15)
BUN: 19 mg/dL (ref 8–23)
CO2: 27 mmol/L (ref 22–32)
Calcium: 9.1 mg/dL (ref 8.9–10.3)
Chloride: 103 mmol/L (ref 98–111)
Creatinine, Ser: 1.37 mg/dL — ABNORMAL HIGH (ref 0.61–1.24)
GFR, Estimated: 57 mL/min — ABNORMAL LOW (ref >60)
Glucose, Bld: 279 mg/dL — ABNORMAL HIGH (ref 70–99)
Potassium: 4.3 mmol/L (ref 3.5–5.1)
Sodium: 135 mmol/L (ref 135–145)
Total Bilirubin: 0.2 mg/dL — ABNORMAL LOW (ref 0.3–1.2)
Total Protein: 7.5 g/dL (ref 6.5–8.1)

## 2020-12-11 LAB — HEMOGLOBIN A1C
Hgb A1c MFr Bld: 9.9 % — ABNORMAL HIGH (ref 4.8–5.6)
Mean Plasma Glucose: 237.43 mg/dL

## 2020-12-11 LAB — LIPID PANEL
Cholesterol: 221 mg/dL — ABNORMAL HIGH (ref 0–200)
HDL: 31 mg/dL — ABNORMAL LOW (ref >40)
LDL Cholesterol: 172 mg/dL — ABNORMAL HIGH (ref 0–99)
Total CHOL/HDL Ratio: 7.1 RATIO
Triglycerides: 91 mg/dL (ref <150)
VLDL: 18 mg/dL (ref 0–40)

## 2020-12-11 LAB — CBC
HCT: 45.9 % (ref 39.0–52.0)
Hemoglobin: 13.5 g/dL (ref 13.0–17.0)
MCH: 23.4 pg — ABNORMAL LOW (ref 26.0–34.0)
MCHC: 29.4 g/dL — ABNORMAL LOW (ref 30.0–36.0)
MCV: 79.4 fL — ABNORMAL LOW (ref 80.0–100.0)
Platelets: 246 10*3/uL (ref 150–400)
RBC: 5.78 MIL/uL (ref 4.22–5.81)
RDW: 21.4 % — ABNORMAL HIGH (ref 11.5–15.5)
WBC: 10.4 10*3/uL (ref 4.0–10.5)
nRBC: 0 % (ref 0.0–0.2)

## 2020-12-11 MED ORDER — SILDENAFIL CITRATE 25 MG PO TABS: 25.0000 mg | ORAL_TABLET | Freq: Every day | ORAL | 2 refills | Status: DC | PRN

## 2020-12-11 MED ORDER — RIVAROXABAN 20 MG PO TABS: 20.0000 mg | ORAL_TABLET | Freq: Every day | ORAL | 3 refills | Status: DC

## 2020-12-11 MED ORDER — METOPROLOL SUCCINATE ER 25 MG PO TB24
25.0000 mg | ORAL_TABLET | Freq: Every day | ORAL | 3 refills | Status: DC
Start: 2020-12-11 — End: 2021-08-18

## 2020-12-11 MED ORDER — ASPIRIN EC 81 MG PO TBEC: 81.0000 mg | DELAYED_RELEASE_TABLET | Freq: Every day | ORAL | 3 refills | Status: DC

## 2020-12-11 MED ORDER — ATORVASTATIN CALCIUM 40 MG PO TABS: 40.0000 mg | ORAL_TABLET | Freq: Every day | ORAL | 3 refills | Status: DC

## 2020-12-11 NOTE — Patient Instructions (Signed)
Medication Instructions:   Start Toprol XL 25 mg Daily  Start Aspirin 81 mg Daily  Start Xarelto 20 mg Daily  Start Lipitor 40 mg Daily   *If you need a refill on your cardiac medications before your next appointment, please call your pharmacy*   Lab Work: Your physician recommends that you return for lab work in: Today ( CMET, A1C, CBC, Lipitor)   If you have labs (blood work) drawn today and your tests are completely normal, you will receive your results only by: [place] (if you have MyChart) OR A paper copy in the mail If you have any lab test that is abnormal or we need to change your treatment, we will call you to review the results.   Testing/Procedures: NONE    Follow-Up: At [facility], you and your health needs are our priority.  As part of our continuing mission to provide you with exceptional heart care, we have created designated Provider Care Teams.  These Care Teams include your primary Cardiologist (physician) and Advanced Practice Providers (APPs -  Physician Assistants and Nurse Practitioners) who all work together to provide you with the care you need, when you need it.  We recommend signing up for the patient portal called "MyChart".  Sign up information is provided on this After Visit Summary.  MyChart is used to connect with patients for Virtual Visits (Telemedicine).  Patients are able to view lab/test results, encounter notes, upcoming appointments, etc.  Non-urgent messages can be sent to your provider as well.   To learn more about what you can do with MyChart, go to [email or web address].    Your next appointment:   3 month(s)  The format for your next appointment:   In Person  Provider:   Dr. Audie Box     Other Instructions Thank you for choosing [facility]!

## 2020-12-15 ENCOUNTER — Other Ambulatory Visit: Payer: Self-pay

## 2020-12-15 ENCOUNTER — Telehealth: Payer: Self-pay | Admitting: Cardiovascular Disease

## 2020-12-15 DIAGNOSIS — I5022 Chronic systolic (congestive) heart failure: Secondary | ICD-10-CM

## 2020-12-15 MED ORDER — SACUBITRIL-VALSARTAN 24-26 MG PO TABS: 1.0000 | ORAL_TABLET | Freq: Two times a day (BID) | ORAL | 3 refills | Status: DC

## 2020-12-15 NOTE — Telephone Encounter (Signed)
Patient call stating since he is out of this medication sacubitril-valsartan (ENTRESTO) 24-26 MG, [facility] to [facility] it to him.

## 2020-12-15 NOTE — Telephone Encounter (Signed)
Spoke to patient. Patient request office contact the [place]- to ask if medication  Entresto 24/26 mg can be shipped overnight.  Patient also asked if all his medication can be shipped overnight. Patient wanted blood sugar medication order as well. RN informed patient Dr Audie Box did not refill medication  diabetic medication - patient will need to contact [place] doctor about restart medications- patient states he has been off all medication since the first of the year 2022.  Patient verbalized understanding.

## 2020-12-15 NOTE — Telephone Encounter (Signed)
[place] -  spoke representative Coralyn Mark - information given to pharmacy  Question  needed to know is how can patient get medication overnight.  Medication were already submitted via e-scribed on 12/11/20  and 12/15/20.   Per representative Coralyn Mark - he  wanted to  see if a pharmacy tech from [facility] could manage call while  RN is on the phone.  Information obtained by representative to give cardiology phone number [phone] - the pharmacy will have to call to confirm  any question back - medication is now being process.  Medication : Entresto 24-26 mg twice a day  Metoprolol succinate 25 mg daily Xarelto 20 mg daily at 6 pm  Atorvastatin  40 mg daily  Sildenafil 25 mg prn

## 2020-12-22 NOTE — Telephone Encounter (Signed)
Left message for pt to call to confirm he received his medication. We have not heard from the [place].

## 2021-04-05 NOTE — Progress Notes (Signed)
?Cardiology Office Note:   ?Date:  04/07/2021  ?NAME:  [name]    ?MRN: [id number] ?DOB:  [birth year]  ? ?PCP:  [facility] [place]  ?Cardiologist:  Rozann Lesches, MD  ?Electrophysiologist:  None  ? ?Referring MD: [facility] [place] ?Chief Complaint  ?Patient presents with  ? Follow-up  ? ?History of Present Illness:   ?[name] is a 66 y.o. male with a hx of systolic heart failure, LV apical thrombus, PAD, tobacco abuse, diabetes who presents for follow-up.  Evaluated in December 2022.  Noncompliant with medications.  Most of his heart failure medicines were restarted.  ? ?He reports he stopped taking all of his medications.  He is only taking tramadol and gabapentin.  He reports he is an active.  Mainly sitting around the house all day.  He reports all of his medications make him feel sick.  He has very poorly controlled diabetes but not taking insulin.  He is not taking any congestive heart failure medications.  No significant chest pain or trouble breathing with this current level of activity.  We discussed trying to get on medications 1 at a time.  Apparently he reports he feels poorly when he takes all of his medications.  I do sympathize with the number of medications he has to take but we need to get him on medications reasonably.  He did have foreign years gangrene and is not on an SGLT2 inhibitor.  I would recommend he avoid this moving forward.  He is still smoking 4 to 5 cigarettes/day.  Overall difficult situation to get him on medications. ? ?Problem List ?CAD ?-PCI to dRCA/PDA 03/2019 ?-90% distal LCX ?-99% dLAD ?2. Systolic HF ?-EF 20-23% ?-noncompliance  ?3. LV apical thrombus  ?-03/11/2019 ?-08/2020 -> Slow flow (on xarelto) ?4. PAD ?5. DM ?-A1c 9.9 ?6. HLD ?-T chol 221, HDL 31 LDL 172, triglyceride 91 ?7. HTN ?8. CKD III ?9.  Tobacco abuse ?10. Fournier's gangrene (no SGLT2) ? ? ?Past Medical History: ?Past Medical History:  ?Diagnosis Date  ? Bulging lumbar disc   ? CAD  (coronary artery disease) [phone]  ? a. prior LAD stenting. b. s/p DES to [facility] 08/2015. c. 04/2016 Cardiac cath at [facility]. Patent stent in the PLAD and RCA. Diffuse dLAD, OM2, and  RPDA disease. d. DES to PDA and distal RCA 03/2019   ? Chronic lower back pain   ? CKD (chronic kidney disease), stage II   ? Cocaine abuse ([place])   ? DM2 (diabetes mellitus, type 2) ([place])   ? Essential hypertension   ? GERD (gastroesophageal reflux disease)   ? Headache   ? History of pneumonia   ? Hyperlipidemia   ? Ischemic cardiomyopathy   ? LV (left ventricular) mural thrombus   ? Sleep apnea   ? ? ?Past Surgical History: ?Past Surgical History:  ?Procedure Laterality Date  ? APPENDECTOMY    ? CARDIAC CATHETERIZATION N/A 09/07/2015  ? Procedure: Left Heart Cath and Coronary Angiography;  Surgeon: Leonie Man, MD;  Location: [place] [facility];  Service: Cardiovascular;  Laterality: N/A;  ? CARDIAC CATHETERIZATION N/A 09/07/2015  ? Procedure: Coronary Stent Intervention;  Surgeon: Leonie Man, MD;  Location: [place] [facility];  Service: Cardiovascular;  Laterality: N/A;  ? CORONARY ANGIOGRAM  09/07/13  ? residual RCA and OM disease  ? CORONARY ANGIOPLASTY WITH STENT PLACEMENT    ? CORONARY STENT INTERVENTION N/A 10/16/2018  ? Procedure: CORONARY  STENT INTERVENTION;  Surgeon: Burnell Blanks, MD;  Location: [place] [facility];  Service: Cardiovascular;  Laterality: N/A;  ? CORONARY STENT INTERVENTION N/A 03/11/2019  ? Procedure: CORONARY STENT INTERVENTION;  Surgeon: [name], MD;  Location: [place] [facility];  Service: Cardiovascular;  Laterality: N/A;  ? FRACTURE SURGERY    ? INCISION AND DRAINAGE OF WOUND Left 05/19/2019  ? Procedure: DEBRIDEMENT LEFT GROIN;  Surgeon: Georganna Skeans, MD;  Location: [place];  Service: General;  Laterality: Left;  ? INSERTION OF ILIAC STENT Left 11/30/2017  ? Left external illiac stent  ? INSERTION OF ILIAC STENT  11/30/2017  ? Procedure: Insertion Of Iliac Stent;   Surgeon: Lorretta Harp, MD;  Location: [place] [facility];  Service: Cardiovascular;;  Left external illiac stent  ? KNEE ARTHROSCOPY Left   ? KNEE SURGERY    ? "ligaments, cartilage; tendon, put a pin in" (11/30/2017)  ? LEFT HEART CATH Bilateral 07/08/2012  ? Procedure: LEFT HEART CATH;  Surgeon: [name], MD;  Location: [facility] CATH LAB;  Service: Cardiovascular;  Laterality: Bilateral;  ? LEFT HEART CATH AND CORONARY ANGIOGRAPHY N/A 10/16/2018  ? Procedure: LEFT HEART CATH AND CORONARY ANGIOGRAPHY;  Surgeon: Burnell Blanks, MD;  Location: [place] [facility];  Service: Cardiovascular;  Laterality: N/A;  ? LEFT HEART CATH AND CORONARY ANGIOGRAPHY N/A 03/11/2019  ? Procedure: LEFT HEART CATH AND CORONARY ANGIOGRAPHY;  Surgeon: [name], MD;  Location: [place] [facility];  Service: Cardiovascular;  Laterality: N/A;  ? LEFT HEART CATHETERIZATION WITH CORONARY ANGIOGRAM N/A 09/06/2013  ? STEMI, 2nd ISR LAD. Procedure: LEFT HEART CATHETERIZATION WITH CORONARY ANGIOGRAM;  Surgeon: [name], MD;  Location: [facility] CATH LAB;  Service: Cardiovascular;  Laterality: N/A;  ? LOWER EXTREMITY ANGIOGRAPHY N/A 11/30/2017  ? Procedure: LOWER EXTREMITY ANGIOGRAPHY;  Surgeon: Lorretta Harp, MD;  Location: [place] [facility];  Service: Cardiovascular;  Laterality: N/A;  ? PERCUTANEOUS CORONARY STENT INTERVENTION (PCI-S)  07/08/2012  ? Procedure: PERCUTANEOUS CORONARY STENT INTERVENTION (PCI-S);  Surgeon: [name], MD;  Location: [facility] CATH LAB;  Service: Cardiovascular;;  DES LAD  ? PERCUTANEOUS CORONARY STENT INTERVENTION (PCI-S) N/A 09/06/2013  ? Procedure: PERCUTANEOUS CORONARY STENT INTERVENTION (PCI-S);  Surgeon: [name], MD;  Location: [facility] CATH LAB;  Service: Cardiovascular;  Laterality: N/A;  Mid LAD 3.0/24mm Promus  ? WOUND DEBRIDEMENT Left 05/20/2019  ? Procedure: DEBRIDEMENT GROIN;  Surgeon: Kinsinger, Arta Bruce, MD;  Location: [place];  Service: General;  Laterality: Left;   ? WRIST FRACTURE SURGERY Bilateral   ? ? ?Current Medications: ?Current Meds  ?Medication Sig  ? gabapentin (NEURONTIN) 300 MG capsule Take 300 mg by mouth 3 (three) times daily.  ? sildenafil (VIAGRA) 25 MG tablet Take 1 tablet (25 mg total) by mouth daily as needed for erectile dysfunction.  ? traMADol (ULTRAM) 50 MG tablet TAKE 1 TABLET BY MOUTH TWO TIMES A DAY AS NEEDED FOR PAIN  ?  ? ?Allergies:    ?Clopidogrel  ? ?Social History: ?Social History  ? ?Socioeconomic History  ? Marital status: Divorced  ?  Spouse name: Not on file  ? Number of children: 3  ? Years of education: Not on file  ? Highest education level: Not on file  ?Occupational History  ? Not on file  ?Tobacco Use  ? Smoking status: Every Day  ?  Packs/day: 0.50  ?  Years: 48.00  ?  Pack years: 24.00  ?  Types:  Cigarettes  ? Smokeless tobacco: Never  ?Vaping Use  ? Vaping Use: Never used  ?Substance and Sexual Activity  ? Alcohol use: Not Currently  ?  Comment: 11/30/2017 "might drink a beer q 6 months"  ? Drug use: Never  ? Sexual activity: Not on file  ?Other Topics Concern  ? Not on file  ?Social History Narrative  ? ** Merged History Encounter **  ?    ? ?Social Determinants of Health  ? ?Financial Resource Strain: Not on file  ?Food Insecurity: Not on file  ?Transportation Needs: Not on file  ?Physical Activity: Not on file  ?Stress: Not on file  ?Social Connections: Not on file  ?  ? ?Family History: ?The patient's family history includes Diabetes in his mother; Hypertension in his mother. ? ?ROS:   ?All other ROS reviewed and negative. Pertinent positives noted in the HPI.    ? ?EKGs/Labs/Other Studies Reviewed:   ?The following studies were personally reviewed by me today: ? ?LHC 03/11/2019 ?Ost 2nd Diag lesion is 80% stenosed. ?Prox LAD to Mid LAD lesion is 10% stenosed. ?Prox LAD lesion is 40% stenosed. ?Dist LAD-1 lesion is 75% stenosed. ?Dist LAD-2 lesion is 99% stenosed. ?Prior LAD stent is patent. ?Previously placed Ramus drug eluting  stent is widely patent. ?1st Mrg-2 lesion is 90% stenosed. ?1st Mrg-1 lesion is 99% stenosed. ?Mid Cx to Dist Cx lesion is 90% stenosed. Diffuse small vessel disease. ?Dist RCA lesion is 90% stenosed. RPDA

## 2021-04-07 ENCOUNTER — Ambulatory Visit (INDEPENDENT_AMBULATORY_CARE_PROVIDER_SITE_OTHER): Payer: No Typology Code available for payment source | Admitting: Cardiovascular Disease

## 2021-04-07 ENCOUNTER — Encounter: Payer: Self-pay | Admitting: Cardiovascular Disease

## 2021-04-07 ENCOUNTER — Other Ambulatory Visit: Payer: Self-pay

## 2021-04-07 VITALS — BP 116/60 | HR 95 | Ht 69.0 in | Wt 224.6 lb

## 2021-04-07 DIAGNOSIS — I5022 Chronic systolic (congestive) heart failure: Secondary | ICD-10-CM

## 2021-04-07 DIAGNOSIS — E782 Mixed hyperlipidemia: Secondary | ICD-10-CM | POA: Diagnosis not present

## 2021-04-07 DIAGNOSIS — I513 Intracardiac thrombosis, not elsewhere classified: Secondary | ICD-10-CM

## 2021-04-07 DIAGNOSIS — E1165 Type 2 diabetes mellitus with hyperglycemia: Secondary | ICD-10-CM

## 2021-04-07 DIAGNOSIS — F17210 Nicotine dependence, cigarettes, uncomplicated: Secondary | ICD-10-CM

## 2021-04-07 DIAGNOSIS — I10 Essential (primary) hypertension: Secondary | ICD-10-CM

## 2021-04-07 DIAGNOSIS — I2510 Atherosclerotic heart disease of native coronary artery without angina pectoris: Secondary | ICD-10-CM | POA: Diagnosis not present

## 2021-04-07 DIAGNOSIS — Z720 Tobacco use: Secondary | ICD-10-CM

## 2021-04-07 NOTE — Patient Instructions (Signed)
Medication Instructions:  ?Re-start Entresto 24/26 mg twice a day ? ?Labwork: ?None today ? ?Testing/Procedures: ?None today ? ?Follow-Up: ?4 months ? ?Any Other Special Instructions Will Be Listed Below (If Applicable). ? ?If you need a refill on your cardiac medications before your next appointment, please call your pharmacy. ? ?

## 2021-07-05 ENCOUNTER — Ambulatory Visit (INDEPENDENT_AMBULATORY_CARE_PROVIDER_SITE_OTHER): Payer: Medicare Other | Admitting: Urology

## 2021-07-05 ENCOUNTER — Encounter: Payer: Self-pay | Admitting: Urology

## 2021-07-05 VITALS — BP 135/85 | HR 87 | Ht 69.0 in | Wt 224.0 lb

## 2021-07-05 DIAGNOSIS — N5082 Scrotal pain: Secondary | ICD-10-CM

## 2021-07-05 DIAGNOSIS — N503 Cyst of epididymis: Secondary | ICD-10-CM

## 2021-07-05 DIAGNOSIS — N529 Male erectile dysfunction, unspecified: Secondary | ICD-10-CM | POA: Diagnosis not present

## 2021-07-05 DIAGNOSIS — R3129 Other microscopic hematuria: Secondary | ICD-10-CM | POA: Diagnosis not present

## 2021-07-05 LAB — URINALYSIS, ROUTINE W REFLEX MICROSCOPIC
Bilirubin, UA: NEGATIVE
Ketones, UA: NEGATIVE
Leukocytes,UA: NEGATIVE
Nitrite, UA: NEGATIVE
Specific Gravity, UA: >1.03 — ABNORMAL HIGH (ref 1.005–1.030)
Urobilinogen, Ur: 2 mg/dL — ABNORMAL HIGH (ref 0.2–1.0)
pH, UA: 5 (ref 5.0–7.5)

## 2021-07-05 LAB — MICROSCOPIC EXAMINATION
Renal Epithel, UA: NONE SEEN /hpf
WBC, UA: NONE SEEN /hpf (ref 0–5)

## 2021-07-05 MED ORDER — DOXYCYCLINE HYCLATE 100 MG PO CAPS: 100.0000 mg | ORAL_CAPSULE | Freq: Two times a day (BID) | ORAL | 0 refills | Status: AC

## 2021-08-02 ENCOUNTER — Encounter: Payer: Self-pay | Admitting: Urology

## 2021-08-02 ENCOUNTER — Ambulatory Visit (INDEPENDENT_AMBULATORY_CARE_PROVIDER_SITE_OTHER): Payer: No Typology Code available for payment source | Admitting: Urology

## 2021-08-02 VITALS — BP 133/83 | HR 78 | Ht 69.0 in | Wt 224.0 lb

## 2021-08-02 DIAGNOSIS — R3129 Other microscopic hematuria: Secondary | ICD-10-CM | POA: Diagnosis not present

## 2021-08-02 DIAGNOSIS — N5082 Scrotal pain: Secondary | ICD-10-CM

## 2021-08-02 DIAGNOSIS — N503 Cyst of epididymis: Secondary | ICD-10-CM | POA: Diagnosis not present

## 2021-08-02 DIAGNOSIS — N529 Male erectile dysfunction, unspecified: Secondary | ICD-10-CM | POA: Diagnosis not present

## 2021-08-02 LAB — MICROSCOPIC EXAMINATION
Bacteria, UA: NONE SEEN
Renal Epithel, UA: NONE SEEN /hpf
WBC, UA: NONE SEEN /hpf (ref 0–5)

## 2021-08-02 LAB — URINALYSIS, ROUTINE W REFLEX MICROSCOPIC
Bilirubin, UA: NEGATIVE
Glucose, UA: NEGATIVE
Ketones, UA: NEGATIVE
Leukocytes,UA: NEGATIVE
Nitrite, UA: NEGATIVE
Specific Gravity, UA: 1.025 (ref 1.005–1.030)
Urobilinogen, Ur: 1 mg/dL (ref 0.2–1.0)
pH, UA: 5 (ref 5.0–7.5)

## 2021-08-02 MED ORDER — DOXYCYCLINE HYCLATE 100 MG PO CAPS: 100.0000 mg | ORAL_CAPSULE | Freq: Two times a day (BID) | ORAL | 0 refills | Status: AC

## 2021-08-02 NOTE — Progress Notes (Signed)
Assessment: 1. Scrotal pain; chronic, bilateral   2. Epididymal cyst   3. Microscopic hematuria   4. Organic impotence     Plan:  Continue doxycycline 100 mg PO BID x 2 additional weeks.  Refill sent. Today I had a discussion with the patient regarding the findings of microscopic hematuria including the implications and differential diagnoses associated with it.  I also discussed recommendations for further evaluation including the rationale for upper tract imaging and cystoscopy.  I discussed the nature of these procedures including potential risk and complications.  The patient expressed an understanding of these issues. Schedule for CT hematuria protocol and cystoscopy BMP, PSA  today  Chief Complaint:  Chief Complaint  Patient presents with   Testicle Pain    History of Present Illness:  [name] is a 66 y.o. year old male who is seen for further evaluation of scrotal pain.  At his initial visit in June 2023, he reported bilateral scrotal pain for approximately 2 years.  His symptoms have been intermittent in nature.  He reported increased pain with movement and certain positions.  He was evaluated with a scrotal ultrasound on 04/30/2021 which showed normal testes bilaterally and a 1 x 1 x 0.9 cm left epididymal cyst.  This cyst was stable in size as compared to a study from 9/22.  He was taking pain medication for his symptoms.  He had not been prescribed any antibiotics. He underwent incision and drainage for gangrene of his left groin area in 2021.  No obvious involvement of the scrotum.  He was not having any significant urinary symptoms other than nocturia.  No dysuria or gross hematuria. IPSS = 6.   He does report problems with achieving and maintaining erections.  Risk factors include coronary artery disease, diabetes, hypercholesterolemia, beta-blocker use, and tobacco use.  He has previously used sildenafil 25 mg daily.  Consultation with his cardiologist was  recommended prior to treatment with any additional oral therapy for erectile dysfunction.  Urinalysis from 6/23 showed 3-10 RBCs. He was treated with doxycycline x 2 weeks.  He returns today for follow-up.  He continues to have intermittent bilateral scrotal pain.  He has episodes daily.  He takes tramadol and soaks in warm water for relief of his symptoms.  He has noted only a slight improvement in his symptoms following treatment with doxycycline.  He is not having any scrotal swelling or redness.  No dysuria or gross hematuria. IPSS = 1 today.  Portions of the above documentation were copied from a prior visit for review purposes only.   Past Medical History:  Past Medical History:  Diagnosis Date   Bulging lumbar disc    CAD (coronary artery disease) [phone]   a. prior LAD stenting. b. s/p DES to [facility] 08/2015. c. 04/2016 Cardiac cath at [facility]. Patent stent in the PLAD and RCA. Diffuse dLAD, OM2, and  RPDA disease. d. DES to PDA and distal RCA 03/2019    Chronic lower back pain    CKD (chronic kidney disease), stage II    Cocaine abuse ([place])    DM2 (diabetes mellitus, type 2) ([facility])    Essential hypertension    GERD (gastroesophageal reflux disease)    Headache    History of pneumonia    Hyperlipidemia    Ischemic cardiomyopathy    LV (left ventricular) mural thrombus    Sleep apnea     Past Surgical History:  Past Surgical History:  Procedure Laterality Date  APPENDECTOMY     CARDIAC CATHETERIZATION N/A 09/07/2015   Procedure: Left Heart Cath and Coronary Angiography;  Surgeon: Leonie Man, MD;  Location: [place] [facility];  Service: Cardiovascular;  Laterality: N/A;   CARDIAC CATHETERIZATION N/A 09/07/2015   Procedure: Coronary Stent Intervention;  Surgeon: Leonie Man, MD;  Location: [place] [facility];  Service: Cardiovascular;  Laterality: N/A;   CORONARY ANGIOGRAM  09/07/13   residual RCA and OM disease   CORONARY ANGIOPLASTY WITH STENT PLACEMENT      CORONARY STENT INTERVENTION N/A 10/16/2018   Procedure: CORONARY STENT INTERVENTION;  Surgeon: Burnell Blanks, MD;  Location: [place] [facility];  Service: Cardiovascular;  Laterality: N/A;   CORONARY STENT INTERVENTION N/A 03/11/2019   Procedure: CORONARY STENT INTERVENTION;  Surgeon: [name], MD;  Location: [place] [facility];  Service: Cardiovascular;  Laterality: N/A;   FRACTURE SURGERY     INCISION AND DRAINAGE OF WOUND Left 05/19/2019   Procedure: DEBRIDEMENT LEFT GROIN;  Surgeon: Georganna Skeans, MD;  Location: [place];  Service: General;  Laterality: Left;   INSERTION OF ILIAC STENT Left 11/30/2017   Left external illiac stent   INSERTION OF ILIAC STENT  11/30/2017   Procedure: Insertion Of Iliac Stent;  Surgeon: Lorretta Harp, MD;  Location: [place] [facility];  Service: Cardiovascular;;  Left external illiac stent   KNEE ARTHROSCOPY Left    KNEE SURGERY     "ligaments, cartilage; tendon, put a pin in" (11/30/2017)   LEFT HEART CATH Bilateral 07/08/2012   Procedure: LEFT HEART CATH;  Surgeon: [name], MD;  Location: [facility] CATH LAB;  Service: Cardiovascular;  Laterality: Bilateral;   LEFT HEART CATH AND CORONARY ANGIOGRAPHY N/A 10/16/2018   Procedure: LEFT HEART CATH AND CORONARY ANGIOGRAPHY;  Surgeon: Burnell Blanks, MD;  Location: [place] [facility];  Service: Cardiovascular;  Laterality: N/A;   LEFT HEART CATH AND CORONARY ANGIOGRAPHY N/A 03/11/2019   Procedure: LEFT HEART CATH AND CORONARY ANGIOGRAPHY;  Surgeon: [name], MD;  Location: [place] [facility];  Service: Cardiovascular;  Laterality: N/A;   LEFT HEART CATHETERIZATION WITH CORONARY ANGIOGRAM N/A 09/06/2013   STEMI, 2nd ISR LAD. Procedure: LEFT HEART CATHETERIZATION WITH CORONARY ANGIOGRAM;  Surgeon: [name], MD;  Location: [facility] CATH LAB;  Service: Cardiovascular;  Laterality: N/A;   LOWER EXTREMITY ANGIOGRAPHY N/A 11/30/2017   Procedure: LOWER EXTREMITY ANGIOGRAPHY;   Surgeon: Lorretta Harp, MD;  Location: [place] [facility];  Service: Cardiovascular;  Laterality: N/A;   PERCUTANEOUS CORONARY STENT INTERVENTION (PCI-S)  07/08/2012   Procedure: PERCUTANEOUS CORONARY STENT INTERVENTION (PCI-S);  Surgeon: [name], MD;  Location: [facility] CATH LAB;  Service: Cardiovascular;;  DES LAD   PERCUTANEOUS CORONARY STENT INTERVENTION (PCI-S) N/A 09/06/2013   Procedure: PERCUTANEOUS CORONARY STENT INTERVENTION (PCI-S);  Surgeon: [name], MD;  Location: [facility] CATH LAB;  Service: Cardiovascular;  Laterality: N/A;  Mid LAD 3.0/24mm Promus   WOUND DEBRIDEMENT Left 05/20/2019   Procedure: DEBRIDEMENT GROIN;  Surgeon: Kinsinger, Arta Bruce, MD;  Location: [place];  Service: General;  Laterality: Left;   WRIST FRACTURE SURGERY Bilateral     Allergies:  Allergies  Allergen Reactions   Clopidogrel     Other reaction(s): Drowsy, Skin irritation    Family History:  Family History  Problem Relation Age of Onset   Hypertension Mother    Diabetes Mother     Social History:  Social History   Tobacco Use  Smoking status: Every Day    Packs/day: 0.50    Years: 48.00    Total pack years: 24.00    Types: Cigarettes   Smokeless tobacco: Never  Vaping Use   Vaping Use: Never used  Substance Use Topics   Alcohol use: Not Currently    Comment: 11/30/2017 "might drink a beer q 6 months"   Drug use: Never    ROS: Constitutional:  Negative for fever, chills, weight loss CV: Negative for chest pain, previous MI, hypertension Respiratory:  Negative for shortness of breath, wheezing, sleep apnea, frequent cough GI:  Negative for nausea, vomiting, bloody stool, GERD  Physical exam: BP 133/83   Pulse 78   Ht '5\' 9"'$  (1.753 m)   Wt 224 lb (101.6 kg)   BMI 33.08 kg/m  GENERAL APPEARANCE:  Well appearing, well developed, well nourished, NAD HEENT:  Atraumatic, normocephalic, oropharynx clear NECK:  Supple without lymphadenopathy or thyromegaly ABDOMEN:   Soft, non-tender, no masses EXTREMITIES:  Moves all extremities well, without clubbing, cyanosis, or edema NEUROLOGIC:  Alert and oriented x 3, uses walker, CN II-XII grossly intact MENTAL STATUS:  appropriate BACK:  Non-tender to palpation, No CVAT SKIN:  Warm, dry, and intact   Results: U/A:  3-10 RBC

## 2021-08-03 LAB — BASIC METABOLIC PANEL
BUN/Creatinine Ratio: 12 (ref 10–24)
BUN: 20 mg/dL (ref 8–27)
CO2: 24 mmol/L (ref 20–29)
Calcium: 8.8 mg/dL (ref 8.6–10.2)
Chloride: 102 mmol/L (ref 96–106)
Creatinine, Ser: 1.68 mg/dL — ABNORMAL HIGH (ref 0.76–1.27)
Glucose: 134 mg/dL — ABNORMAL HIGH (ref 70–99)
Potassium: 4.2 mmol/L (ref 3.5–5.2)
Sodium: 140 mmol/L (ref 134–144)
eGFR: 44 mL/min/{1.73_m2} — ABNORMAL LOW (ref >59)

## 2021-08-03 LAB — PSA: Prostate Specific Ag, Serum: 1.8 ng/mL (ref 0.0–4.0)

## 2021-08-06 ENCOUNTER — Other Ambulatory Visit: Payer: Self-pay

## 2021-08-06 ENCOUNTER — Emergency Department (HOSPITAL_COMMUNITY)
Admission: EM | Admit: 2021-08-06 | Discharge: 2021-08-06 | Disposition: A | Discharge status: Home / Self Care | Payer: No Typology Code available for payment source | Attending: Emergency Medicine | Admitting: Emergency Medicine

## 2021-08-06 ENCOUNTER — Encounter (HOSPITAL_COMMUNITY): Payer: Self-pay

## 2021-08-06 ENCOUNTER — Telehealth: Payer: Self-pay

## 2021-08-06 DIAGNOSIS — N50819 Testicular pain, unspecified: Secondary | ICD-10-CM | POA: Diagnosis not present

## 2021-08-06 DIAGNOSIS — N189 Chronic kidney disease, unspecified: Secondary | ICD-10-CM | POA: Diagnosis not present

## 2021-08-06 DIAGNOSIS — Z794 Long term (current) use of insulin: Secondary | ICD-10-CM | POA: Insufficient documentation

## 2021-08-06 DIAGNOSIS — Z7901 Long term (current) use of anticoagulants: Secondary | ICD-10-CM | POA: Diagnosis not present

## 2021-08-06 DIAGNOSIS — I2510 Atherosclerotic heart disease of native coronary artery without angina pectoris: Secondary | ICD-10-CM | POA: Insufficient documentation

## 2021-08-06 DIAGNOSIS — Z7984 Long term (current) use of oral hypoglycemic drugs: Secondary | ICD-10-CM | POA: Diagnosis not present

## 2021-08-06 DIAGNOSIS — Z7982 Long term (current) use of aspirin: Secondary | ICD-10-CM | POA: Insufficient documentation

## 2021-08-06 DIAGNOSIS — Z760 Encounter for issue of repeat prescription: Secondary | ICD-10-CM | POA: Diagnosis not present

## 2021-08-06 DIAGNOSIS — E1122 Type 2 diabetes mellitus with diabetic chronic kidney disease: Secondary | ICD-10-CM | POA: Insufficient documentation

## 2021-08-06 MED ORDER — TRAMADOL HCL 50 MG PO TABS: 50.0000 mg | ORAL_TABLET | Freq: Four times a day (QID) | ORAL | 0 refills | Status: DC | PRN

## 2021-08-06 MED ORDER — GABAPENTIN 300 MG PO CAPS: 300.0000 mg | ORAL_CAPSULE | Freq: Three times a day (TID) | ORAL | 0 refills | Status: DC

## 2021-08-06 NOTE — Discharge Instructions (Addendum)
I filled your gabapentin as requested.  I also sent you in a few days of tramadol, but in order to have a full refill, you will need to follow-up either with the [facility] or another primary care physician.  Please remember to follow-up with your urologist to complete work-up for your testicular pain.

## 2021-08-06 NOTE — Telephone Encounter (Signed)
Patient called requesting pain meds.

## 2021-08-06 NOTE — ED Triage Notes (Addendum)
Patient complaining of testicle pain for the past two years. States that he was recently seen by Urology and diagnosed with hematuria and started on ABT. States that he has procedure/US scheduled to deal with testicle pain but is here today because he is out of Gabapentin, Ultram. States that he has been out for 2-3 days because he has recently established new PCP and they don't won't prescribe and referred to ED.

## 2021-08-06 NOTE — ED Provider Notes (Signed)
[facility] EMERGENCY DEPARTMENT Provider Note   CSN: [id number] Arrival date & time: 08/06/21  1014     History  Chief Complaint  Patient presents with   Testicle Pain    [name] is a 66 y.o. male with history of type 2 diabetes with CKD, CAD, NSTEMI and ongoing testicular pain presents to the emergency department requesting refills of his medications.  Patient states that he has been having testicle pain on and off for the last 2 years.  He has been seen twice by urology and was started on doxycycline, which he is currently taking.  Patient was seen by Dr. Felipa Eth as recently as 08/02/2021 who advises continue doxycycline and CT hematuria and cystoscopy to evaluate for his ongoing hematuria and testicular pain.  Patient states that he has been on tramadol and gabapentin for many years as prescribed by the [place].  He has run out of both of these medications and is requesting refills.  He states that he called his urologist today, and since they did not prescribe this medication, they would not refill it for him.  Patient states that he is currently trying to find care outside of the [place] to save time on the drive.  He states he has no new symptoms and denies chest pain, shortness of breath, abdominal pain, nausea, vomiting, diarrhea.   Testicle Pain Pertinent negatives include no abdominal pain and no shortness of breath.       Home Medications Prior to Admission medications   Medication Sig Start Date End Date Taking? Authorizing Provider  traMADol (ULTRAM) 50 MG tablet Take 1 tablet (50 mg total) by mouth every 6 (six) hours as needed. 08/06/21  Yes Tonye Pearson, PA-C  acetaminophen (TYLENOL) 325 MG tablet Take 2 tablets (650 mg total) by mouth every 6 (six) hours as needed for mild pain (or Fever >/= 101). 05/24/19   Debbe Odea, MD  alum & mag hydroxide-simeth (MAALOX/MYLANTA) 200-200-20 MG/5ML suspension Take 30 mLs by mouth every 6 (six) hours as needed for indigestion or  heartburn. 05/24/19   Debbe Odea, MD  Amino Acids-Protein Hydrolys (FEEDING SUPPLEMENT, PRO-STAT SUGAR FREE 64,) LIQD Take 30 mLs by mouth 3 (three) times daily. 05/24/19   Debbe Odea, MD  aspirin EC 81 MG tablet Take 1 tablet (81 mg total) by mouth daily. Swallow whole. 12/11/20   O'NealCassie Freer, MD  docusate sodium (COLACE) 100 MG capsule Take 1 capsule (100 mg total) by mouth 2 (two) times daily. 05/24/19   Debbe Odea, MD  doxycycline (VIBRAMYCIN) 100 MG capsule Take 1 capsule (100 mg total) by mouth every 12 (twelve) hours for 14 days. 08/02/21 08/16/21  Stoneking, Reece Leader., MD  empagliflozin (JARDIANCE) 25 MG TABS tablet Take 12.5 mg by mouth daily.    [provider]  gabapentin (NEURONTIN) 300 MG capsule Take 1 capsule (300 mg total) by mouth 3 (three) times daily. 08/06/21 09/05/21  Tonye Pearson, PA-C  insulin aspart (NOVOLOG FLEXPEN) 100 UNIT/ML FlexPen Inject 1-15 Units into the skin 3 (three) times daily with meals. CBG 121 - 150: 2 units  CBG 151 - 200: 3 units  CBG 201 - 250: 5 units  CBG 251 - 300: 8 units  CBG 301 - 350: 11 units  CBG 351 - 400: 15 units 05/24/19   Debbe Odea, MD  insulin glargine (LANTUS) 100 UNIT/ML injection Inject 0.18 mLs (18 Units total) into the skin daily. 05/27/19   Debbe Odea, MD  Menthol-Methyl Salicylate (  MUSCLE RUB) 10-15 % CREA Apply 1 application topically 4 (four) times daily. Apply to medial left ankle QID 05/24/19   Debbe Odea, MD  metFORMIN (GLUCOPHAGE) 1000 MG tablet Take 1 tablet (1,000 mg total) by mouth 2 (two) times daily with a meal. Do not restart Metformin until Wednesday am 7/2 07/10/12   [name], MD  metoprolol succinate (TOPROL XL) 25 MG 24 hr tablet Take 1 tablet (25 mg total) by mouth daily. 12/11/20   O'NealCassie Freer, MD  Multiple Vitamin (MULTIVITAMIN WITH MINERALS) TABS tablet Take 1 tablet by mouth daily. 05/24/19   Debbe Odea, MD  nitroGLYCERIN (NITROSTAT) 0.4 MG SL tablet Place 1 tablet  (0.4 mg total) under the tongue every 5 (five) minutes x 3 doses as needed for chest pain. 10/05/18   Herminio Commons, MD  oxyCODONE (OXY IR/ROXICODONE) 5 MG immediate release tablet Take 1-2 tablets (5-10 mg total) by mouth every 6 (six) hours as needed for moderate pain or severe pain ('5mg'$  for moderate pain, '10mg'$  for severe pain- give '10mg'$  30-45 prior to dressing changes). 05/24/19   Debbe Odea, MD  polyethylene glycol (MIRALAX / GLYCOLAX) 17 g packet Take 17 g by mouth daily. 05/24/19   Debbe Odea, MD  rivaroxaban (XARELTO) 20 MG TABS tablet Take 1 tablet (20 mg total) by mouth daily with supper. 12/11/20   O'NealCassie Freer, MD  sacubitril-valsartan (ENTRESTO) 24-26 MG Take 1 tablet by mouth 2 (two) times daily. 12/15/20   O'NealCassie Freer, MD  sildenafil (VIAGRA) 25 MG tablet Take 1 tablet (25 mg total) by mouth daily as needed for erectile dysfunction. 12/11/20   O'NealCassie Freer, MD  simethicone (MYLICON) 80 MG chewable tablet Chew 1 tablet (80 mg total) by mouth 4 (four) times daily as needed for flatulence. 05/24/19   Debbe Odea, MD  traMADol (ULTRAM) 50 MG tablet TAKE 1 TABLET BY MOUTH TWO TIMES A DAY AS NEEDED FOR PAIN 03/06/21   [provider]      Allergies    Clopidogrel    Review of Systems   Review of Systems  Constitutional:  Negative for fever.  Respiratory:  Negative for shortness of breath.   Gastrointestinal:  Negative for abdominal pain, diarrhea, nausea and vomiting.  Genitourinary:  Positive for testicular pain.    Physical Exam Updated Vital Signs BP (!) 136/93   Pulse 84   Temp 97.9 F (36.6 C)   Resp 18   Ht '5\' 9"'$  (1.753 m)   Wt 101.6 kg   SpO2 96%   BMI 33.08 kg/m  Physical Exam Vitals and nursing note reviewed.  Constitutional:      General: He is not in acute distress.    Appearance: He is not ill-appearing.  HENT:     Head: Atraumatic.  Eyes:     Conjunctiva/sclera: Conjunctivae normal.  Cardiovascular:     Rate  and Rhythm: Normal rate and regular rhythm.     Pulses: Normal pulses.     Heart sounds: No murmur heard. Pulmonary:     Effort: Pulmonary effort is normal. No respiratory distress.     Breath sounds: Normal breath sounds.  Abdominal:     General: Abdomen is flat. There is no distension.     Palpations: Abdomen is soft.     Tenderness: There is no abdominal tenderness.  Musculoskeletal:        General: Normal range of motion.     Cervical back: Normal range of motion.  Skin:  General: Skin is warm and dry.     Capillary Refill: Capillary refill takes less than 2 seconds.  Neurological:     General: No focal deficit present.     Mental Status: He is alert.  Psychiatric:        Mood and Affect: Mood normal.     ED Results / Procedures / Treatments   Labs (all labs ordered are listed, but only abnormal results are displayed) Labs Reviewed - No data to display  EKG None  Radiology No results found.  Procedures Procedures    Medications Ordered in ED Medications - No data to display  ED Course/ Medical Decision Making/ A&P                           Medical Decision Making Risk Prescription drug management.   66 year old male presents emergency department requesting refill of his tramadol and gabapentin.  Per chart review, urologist notes that neither of these medications is indicated for the conditions he is currently treating for and declined refilling medication for patient.  I am able to review partial records from the [place] which does indicate that he has been on tramadol for quite some time.  He states that he may be able to call the [place] on Monday to request a refill, however he is worried about going into the weekend without any pain medications.  Per PDMP, tramadol has been refilled by [facility] and he has not been seen in the emergency department requesting pain medications in the past.  Discussed this case with my attending, Dr. Sabra Heck, who recommends that if PDMP is  reassuring, we can send in a few days worth of his tramadol and also refill his gabapentin.  After that, he will need to follow-up with primary care for additional refills.  Patient expresses understanding and is amenable to this plan.  He was discharged home in good condition. Final Clinical Impression(s) / ED Diagnoses Final diagnoses:  Medication refill    Rx / DC Orders ED Discharge Orders          Ordered    gabapentin (NEURONTIN) 300 MG capsule  3 times daily        08/06/21 1123    traMADol (ULTRAM) 50 MG tablet  Every 6 hours PRN        08/06/21 1123              Tonye Pearson, [place] 08/06/21 1206    Noemi Chapel, MD 08/09/21 1144

## 2021-08-09 NOTE — Telephone Encounter (Signed)
Dr. Felipa Eth recommends patient reach out to PCP for this, he is not currently rx'ing pain meds for the patient at this time.  Called the patient and left voicemail for him to return call.

## 2021-08-12 NOTE — Progress Notes (Signed)
Cardiology Office Note:   Date:  08/13/2021  NAME:  [name]    MRN: [id number] DOB:  Nov 21, 1955   PCP:  Carrolyn Meiers, MD  Cardiologist:  Rozann Lesches, MD  Electrophysiologist:  None   Referring MD: [place] Complaint  Patient presents with   Follow-up   History of Present Illness:   [name] is a 66 y.o. male with a hx of CAD, systolic heart failure, LV apical thrombus, diabetes, hyperlipidemia, hypertension who presents for follow-up.  He reports he is only taking tramadol and gabapentin.  He is still not interested in taking any cardiac medications.  We again discussed the importance of getting back on his heart medications but he would like to not do this.  Regardless his weights are stable.  He reports no significant chest pain or trouble breathing.  He is still smoking a pack per day.  He does request refills for Viagra.  He has not seen his primary care physician yet.  He is transitioning to [place].  Overall seems to be stable.  Still not interested in taking medications.  Problem List CAD -PCI to dRCA/PDA 03/2019 -90% distal LCX -99% dLAD 2. Systolic HF -EF 10-27% -noncompliance  3. LV apical thrombus  -03/11/2019 -08/2020 -> Slow flow (on xarelto) 4. PAD 5. DM -A1c 9.9 6. HLD -T chol 221, HDL 31 LDL 172, triglyceride 91 7. HTN 8. CKD III 9.  Tobacco abuse -1.5 ppd 10. Fournier's gangrene (no SGLT2)  Past Medical History: Past Medical History:  Diagnosis Date   Bulging lumbar disc    CAD (coronary artery disease) [phone]   a. prior LAD stenting. b. s/p DES to [facility] 08/2015. c. 04/2016 Cardiac cath at [facility]. Patent stent in the PLAD and RCA. Diffuse dLAD, OM2, and  RPDA disease. d. DES to PDA and distal RCA 03/2019    Chronic lower back pain    CKD (chronic kidney disease), stage II    Cocaine abuse ([place])    DM2 (diabetes mellitus, type 2) (HCC)    Essential hypertension    GERD (gastroesophageal  reflux disease)    Headache    History of pneumonia    Hyperlipidemia    Ischemic cardiomyopathy    LV (left ventricular) mural thrombus    Sleep apnea     Past Surgical History: Past Surgical History:  Procedure Laterality Date   APPENDECTOMY     CARDIAC CATHETERIZATION N/A 09/07/2015   Procedure: Left Heart Cath and Coronary Angiography;  Surgeon: Leonie Man, MD;  Location: [place] CV LAB;  Service: Cardiovascular;  Laterality: N/A;   CARDIAC CATHETERIZATION N/A 09/07/2015   Procedure: Coronary Stent Intervention;  Surgeon: Leonie Man, MD;  Location: [place] [facility] LAB;  Service: Cardiovascular;  Laterality: N/A;   CORONARY ANGIOGRAM  09/07/13   residual RCA and OM disease   CORONARY ANGIOPLASTY WITH STENT PLACEMENT     CORONARY STENT INTERVENTION N/A 10/16/2018   Procedure: CORONARY STENT INTERVENTION;  Surgeon: Burnell Blanks, MD;  Location: [place] CV LAB;  Service: Cardiovascular;  Laterality: N/A;   CORONARY STENT INTERVENTION N/A 03/11/2019   Procedure: CORONARY STENT INTERVENTION;  Surgeon: [name], MD;  Location: [place] [facility] LAB;  Service: Cardiovascular;  Laterality: N/A;   FRACTURE SURGERY     INCISION AND DRAINAGE OF WOUND Left 05/19/2019   Procedure: DEBRIDEMENT LEFT GROIN;  Surgeon: Georganna Skeans, MD;  Location: [place];  Service:  General;  Laterality: Left;   INSERTION OF ILIAC STENT Left 11/30/2017   Left external illiac stent   INSERTION OF ILIAC STENT  11/30/2017   Procedure: Insertion Of Iliac Stent;  Surgeon: Lorretta Harp, MD;  Location: [place] CV LAB;  Service: Cardiovascular;;  Left external illiac stent   KNEE ARTHROSCOPY Left    KNEE SURGERY     "ligaments, cartilage; tendon, put a pin in" (11/30/2017)   LEFT HEART CATH Bilateral 07/08/2012   Procedure: LEFT HEART CATH;  Surgeon: [name], MD;  Location: [facility] CATH LAB;  Service: Cardiovascular;  Laterality: Bilateral;   LEFT HEART CATH AND CORONARY  ANGIOGRAPHY N/A 10/16/2018   Procedure: LEFT HEART CATH AND CORONARY ANGIOGRAPHY;  Surgeon: Burnell Blanks, MD;  Location: [place] CV LAB;  Service: Cardiovascular;  Laterality: N/A;   LEFT HEART CATH AND CORONARY ANGIOGRAPHY N/A 03/11/2019   Procedure: LEFT HEART CATH AND CORONARY ANGIOGRAPHY;  Surgeon: [name], MD;  Location: [place] CV LAB;  Service: Cardiovascular;  Laterality: N/A;   LEFT HEART CATHETERIZATION WITH CORONARY ANGIOGRAM N/A 09/06/2013   STEMI, 2nd ISR LAD. Procedure: LEFT HEART CATHETERIZATION WITH CORONARY ANGIOGRAM;  Surgeon: [name], MD;  Location: [facility] CATH LAB;  Service: Cardiovascular;  Laterality: N/A;   LOWER EXTREMITY ANGIOGRAPHY N/A 11/30/2017   Procedure: LOWER EXTREMITY ANGIOGRAPHY;  Surgeon: Lorretta Harp, MD;  Location: [place] CV LAB;  Service: Cardiovascular;  Laterality: N/A;   PERCUTANEOUS CORONARY STENT INTERVENTION (PCI-S)  07/08/2012   Procedure: PERCUTANEOUS CORONARY STENT INTERVENTION (PCI-S);  Surgeon: [name], MD;  Location: [facility] CATH LAB;  Service: Cardiovascular;;  DES LAD   PERCUTANEOUS CORONARY STENT INTERVENTION (PCI-S) N/A 09/06/2013   Procedure: PERCUTANEOUS CORONARY STENT INTERVENTION (PCI-S);  Surgeon: [name], MD;  Location: [facility] CATH LAB;  Service: Cardiovascular;  Laterality: N/A;  Mid LAD 3.0/24mm Promus   WOUND DEBRIDEMENT Left 05/20/2019   Procedure: DEBRIDEMENT GROIN;  Surgeon: Kinsinger, Arta Bruce, MD;  Location: [place];  Service: General;  Laterality: Left;   WRIST FRACTURE SURGERY Bilateral     Current Medications: Current Meds  Medication Sig   gabapentin (NEURONTIN) 300 MG capsule Take 1 capsule (300 mg total) by mouth 3 (three) times daily.   sildenafil (VIAGRA) 50 MG tablet Take 1 tablet (50 mg total) by mouth daily as needed for erectile dysfunction.   traMADol (ULTRAM) 50 MG tablet TAKE 1 TABLET BY MOUTH TWO TIMES A DAY AS NEEDED FOR PAIN   traMADol (ULTRAM) 50 MG  tablet Take 1 tablet (50 mg total) by mouth every 6 (six) hours as needed.     Allergies:    Clopidogrel   Social History: Social History   Socioeconomic History   Marital status: Divorced    Spouse name: Not on file   Number of children: 3   Years of education: Not on file   Highest education level: Not on file  Occupational History   Not on file  Tobacco Use   Smoking status: Every Day    Packs/day: 0.50    Years: 48.00    Total pack years: 24.00    Types: Cigarettes   Smokeless tobacco: Never  Vaping Use   Vaping Use: Never used  Substance and Sexual Activity   Alcohol use: Not Currently    Comment: 11/30/2017 "might drink a beer q 6 months"   Drug use: Never   Sexual activity: Not on file  Other Topics Concern   Not on  file  Social History Narrative   ** Merged History Encounter **       Social Determinants of Health   Financial Resource Strain: Not on file  Food Insecurity: Not on file  Transportation Needs: Not on file  Physical Activity: Not on file  Stress: Not on file  Social Connections: Not on file     Family History: The patient's family history includes Diabetes in his mother; Hypertension in his mother.  ROS:   All other ROS reviewed and negative. Pertinent positives noted in the HPI.     EKGs/Labs/Other Studies Reviewed:   The following studies were personally reviewed by me today:  TTE 08/31/2020  1. There is swirling, slow flowing contrast at the LV apex, and a  discreet apical-lateral echo density which may represent thrombus. On  review of prior CTA chest from 10/2018, this may represent prominent  trabeculations vs low inserting papillary muscle   vs thrombus in setting of apical akinesis. Consider cardiac MRI to define  this region further.   2. Left ventricular ejection fraction, by estimation, is 20 to 25%. The  left ventricle has severely decreased function. The left ventricle  demonstrates global hypokinesis with apical  akinesis. The left ventricular  internal cavity size was mildly  dilated. There is mild left ventricular hypertrophy. Left ventricular  diastolic parameters are indeterminate.   3. Right ventricular systolic function is moderately reduced. The right  ventricular size is mildly enlarged. There is moderately elevated  pulmonary artery systolic pressure. The estimated right ventricular  systolic pressure is 35.0 mmHg.   4. Left atrial size was mildly dilated.   5. The mitral valve is grossly normal. Mild to moderate mitral valve  regurgitation. No evidence of mitral stenosis.   6. The aortic valve is grossly normal. There is mild calcification of the  aortic valve. Aortic valve regurgitation is not visualized. No aortic  stenosis is present.   7. The inferior vena cava is dilated in size with <50% respiratory  variability, suggesting right atrial pressure of 15 mmHg.   Recent Labs: 12/11/2020: ALT 21; Hemoglobin 13.5; Platelets 246 08/02/2021: BUN 20; Creatinine, Ser 1.68; Potassium 4.2; Sodium 140   Recent Lipid Panel    Component Value Date/Time   CHOL 221 (H) 12/11/2020 1218   TRIG 91 12/11/2020 1218   HDL 31 (L) 12/11/2020 1218   CHOLHDL 7.1 12/11/2020 1218   VLDL 18 12/11/2020 1218   LDLCALC 172 (H) 12/11/2020 1218    Physical Exam:   VS:  BP 116/84   Pulse 84   Ht '5\' 9"'$  (1.753 m)   Wt 224 lb 12.8 oz (102 kg)   SpO2 97%   BMI 33.20 kg/m    Wt Readings from Last 3 Encounters:  08/13/21 224 lb 12.8 oz (102 kg)  08/06/21 224 lb (101.6 kg)  08/02/21 224 lb (101.6 kg)    General: Well nourished, well developed, in no acute distress Head: Atraumatic, normal size  Eyes: PEERLA, EOMI  Neck: Supple, no JVD Endocrine: No thryomegaly Cardiac: Normal S1, S2; RRR; no murmurs, rubs, or gallops Lungs: Clear to auscultation bilaterally, no wheezing, rhonchi or rales  Abd: Soft, nontender, no hepatomegaly  Ext: No edema, pulses 2+ Musculoskeletal: No deformities, BUE and BLE  strength normal and equal Skin: Warm and dry, no rashes   Neuro: Alert and oriented to person, place, time, and situation, CNII-XII grossly intact, no focal deficits  Psych: Normal mood and affect   ASSESSMENT:   Amahri Dengel  Glogowski is a 66 y.o. male who presents for the following: 1. Chronic systolic heart failure (HCC)   2. Apical mural thrombus   3. Coronary artery disease involving native coronary artery of native heart without angina pectoris   4. Mixed hyperlipidemia   5. Primary hypertension     PLAN:   1. Chronic systolic heart failure (HCC) 2. Apical mural thrombus 3. Coronary artery disease involving native coronary artery of native heart without angina pectoris 4. Mixed hyperlipidemia 5. Primary hypertension -Known history of an ischemic cardiomyopathy.  Is undergone PCI in March 2021.  EF remains around 20-25%.  History of LV apical thrombus as well. -He reports he is not interested in taking medications.  He is surprisingly euvolemic.  He seems to be stable and doing well.  I did counsel him that he needs to get back on his heart medications as this will help him live longer.  For now he wishes to forego all medications.  He is also not taking his diabetes medicines or any of his cholesterol-lowering agents.  This is problematic.  He is still smoking a pack to 1/2 pack/day.  All he is willing to take his gabapentin and tramadol for now.  He does request a refill on Viagra which we provided today.  I see no harm in this is he is on no medications which could cause problems for him.  He will plan to see [place] yearly.  He will let us know if things change and he reconsiders taking his medications.      Disposition: Return in about 1 year (around 08/14/2022).  Medication Adjustments/Labs and Tests Ordered: Current medicines are reviewed at length with the patient today.  Concerns regarding medicines are outlined above.  No orders of the defined types were placed in this  encounter.  Meds ordered this encounter  Medications   sildenafil (VIAGRA) 50 MG tablet    Sig: Take 1 tablet (50 mg total) by mouth daily as needed for erectile dysfunction.    Dispense:  15 tablet    Refill:  2    Patient Instructions  Medication Instructions:  Your physician recommends that you continue on your current medications as directed. Please refer to the Current Medication list given to you today.  We encourage you to take your medications.  Labwork: None today  Testing/Procedures: None today  Follow-Up: 1 year with Dr.O'Neal  Any Other Special Instructions Will Be Listed Below (If Applicable).  If you need a refill on your cardiac medications before your next appointment, please call your pharmacy.    Time Spent with Patient: I have spent a total of 25 minutes with patient reviewing hospital notes, telemetry, EKGs, labs and examining the patient as well as establishing an assessment and plan that was discussed with the patient.  > 50% of time was spent in direct patient care.  Signed, Addison Naegeli. Audie Box, MD, [place]  [address] [place], [place] [address] [phone]  08/13/2021 3:01 PM

## 2021-08-13 ENCOUNTER — Ambulatory Visit (INDEPENDENT_AMBULATORY_CARE_PROVIDER_SITE_OTHER): Payer: No Typology Code available for payment source | Admitting: Cardiovascular Disease

## 2021-08-13 ENCOUNTER — Encounter: Payer: Self-pay | Admitting: Cardiovascular Disease

## 2021-08-13 VITALS — BP 116/84 | HR 84 | Ht 69.0 in | Wt 224.8 lb

## 2021-08-13 DIAGNOSIS — E782 Mixed hyperlipidemia: Secondary | ICD-10-CM

## 2021-08-13 DIAGNOSIS — I10 Essential (primary) hypertension: Secondary | ICD-10-CM

## 2021-08-13 DIAGNOSIS — I513 Intracardiac thrombosis, not elsewhere classified: Secondary | ICD-10-CM | POA: Diagnosis not present

## 2021-08-13 DIAGNOSIS — I5022 Chronic systolic (congestive) heart failure: Secondary | ICD-10-CM

## 2021-08-13 DIAGNOSIS — I2510 Atherosclerotic heart disease of native coronary artery without angina pectoris: Secondary | ICD-10-CM | POA: Diagnosis not present

## 2021-08-13 MED ORDER — SILDENAFIL CITRATE 50 MG PO TABS: 50.0000 mg | ORAL_TABLET | Freq: Every day | ORAL | 2 refills | Status: DC | PRN

## 2021-08-13 NOTE — Patient Instructions (Signed)
Medication Instructions:  Your physician recommends that you continue on your current medications as directed. Please refer to the Current Medication list given to you today.  We encourage you to take your medications.  Labwork: None today  Testing/Procedures: None today  Follow-Up: 1 year with Dr.O'Neal  Any Other Special Instructions Will Be Listed Below (If Applicable).  If you need a refill on your cardiac medications before your next appointment, please call your pharmacy.

## 2021-08-18 ENCOUNTER — Ambulatory Visit (INDEPENDENT_AMBULATORY_CARE_PROVIDER_SITE_OTHER): Payer: No Typology Code available for payment source | Admitting: Urology

## 2021-08-18 ENCOUNTER — Encounter: Payer: Self-pay | Admitting: Urology

## 2021-08-18 ENCOUNTER — Other Ambulatory Visit: Payer: Self-pay | Admitting: Urology

## 2021-08-18 VITALS — BP 114/78 | HR 98

## 2021-08-18 DIAGNOSIS — R3129 Other microscopic hematuria: Secondary | ICD-10-CM

## 2021-08-18 DIAGNOSIS — N5082 Scrotal pain: Secondary | ICD-10-CM

## 2021-08-18 IMAGING — CT CT ABD-PELV W/O CM
3 of 6 series · 15 of 36 positions shown, 17 images · non-contrast
Comparison: Pelvis CT on 05/15/2019, and AP CT on 05/14/2019

CLINICAL DATA: Lower abdominal pain. Sepsis. Left groin cellulitis.

EXAM:
CT CHEST, ABDOMEN AND PELVIS WITHOUT CONTRAST
TECHNIQUE: Multidetector CT imaging of the chest, abdomen and pelvis was
performed following the standard protocol without IV contrast.

[Series 4: thins · axial · 0.87mm/px · z∈[+684,+962]mm · 4 of 1559 slices shown]
[im 112/1559  lung]
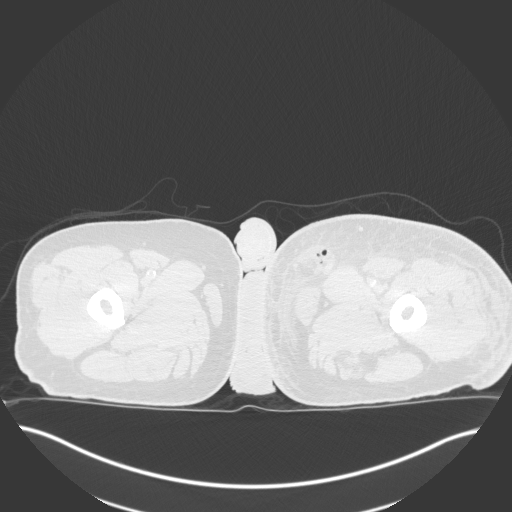
[im 334/1559  lung]
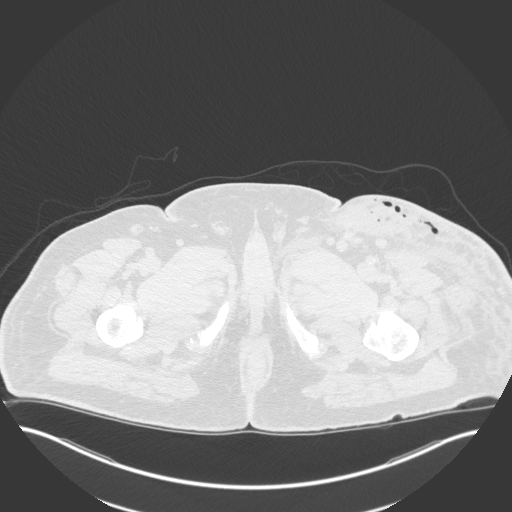
[im 557/1559  lung]
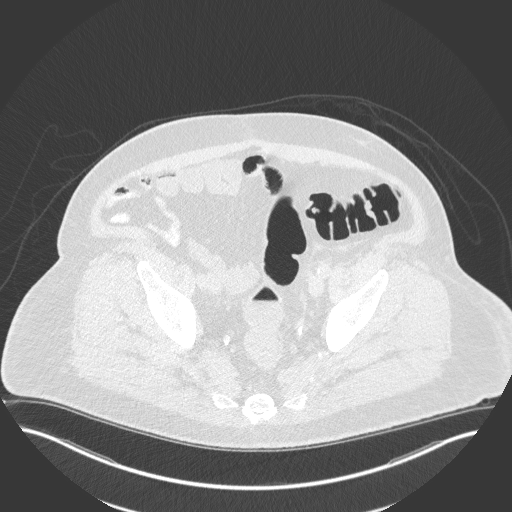
[im 668/1559  lung]
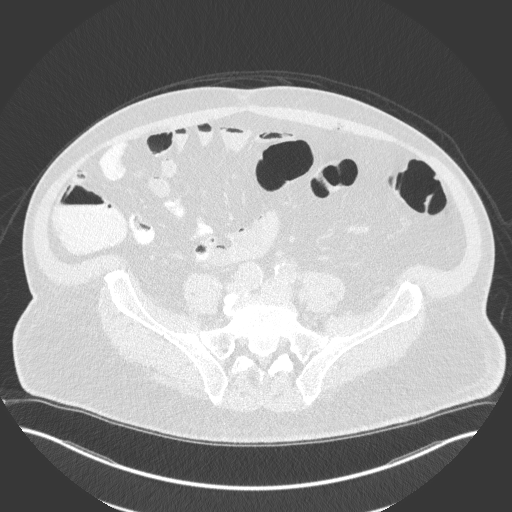

[Series 6: coronal · coronal · 0.94mm/px · 3 of 175 slices shown]
[im 35/175  lung]
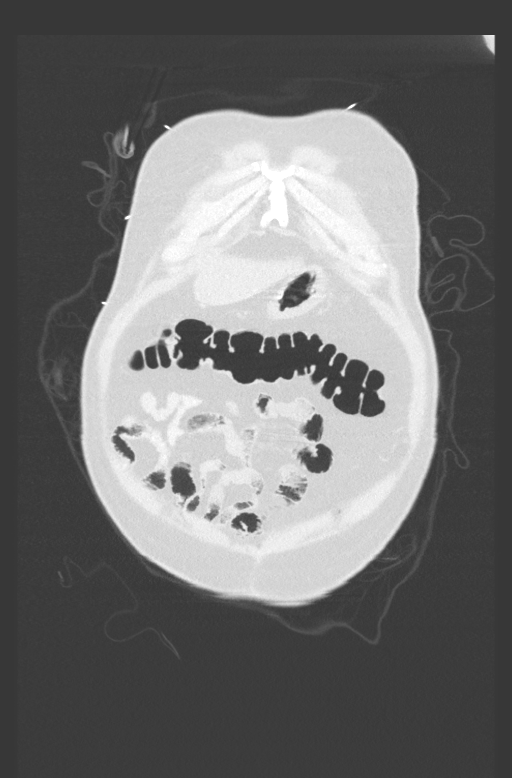
[im 70/175  lung]
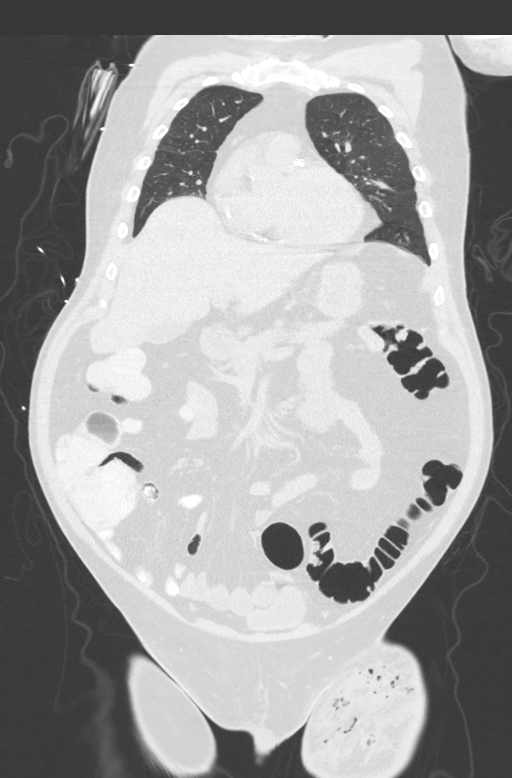
[im 105/175  lung]
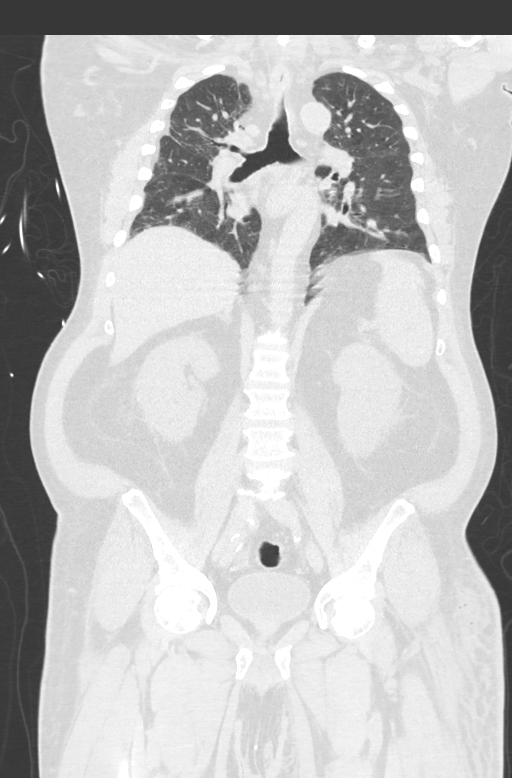

[Series 8: super d · axial · 0.82mm/px · z∈[+715,+1320]mm · 8 of 974 slices shown, 10 images]
[im 109/974  mediastinal]
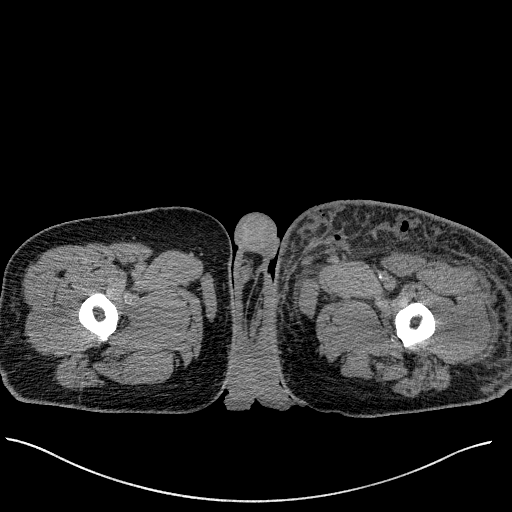
[im 109/974  lung]
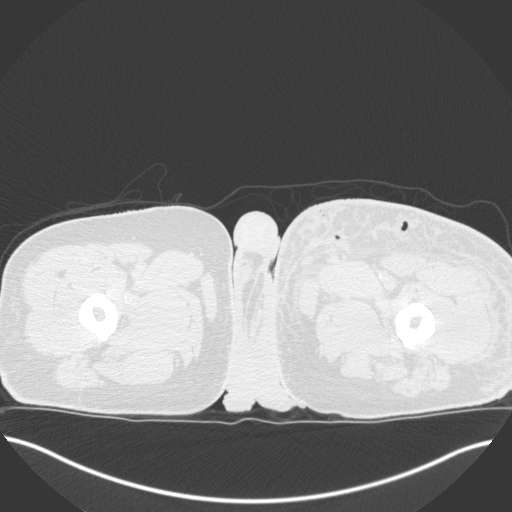
[im 217/974  lung]
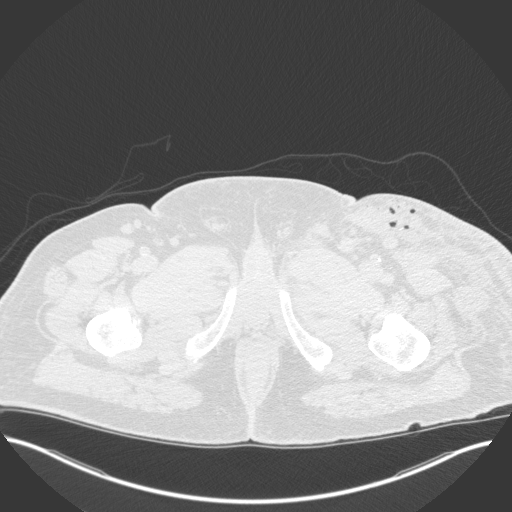
[im 325/974  lung]
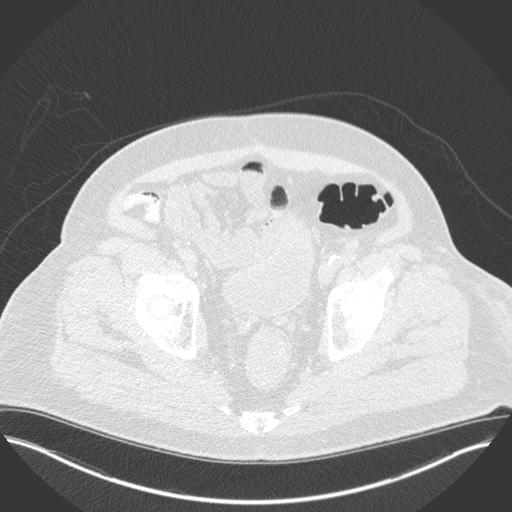
[im 433/974  lung]
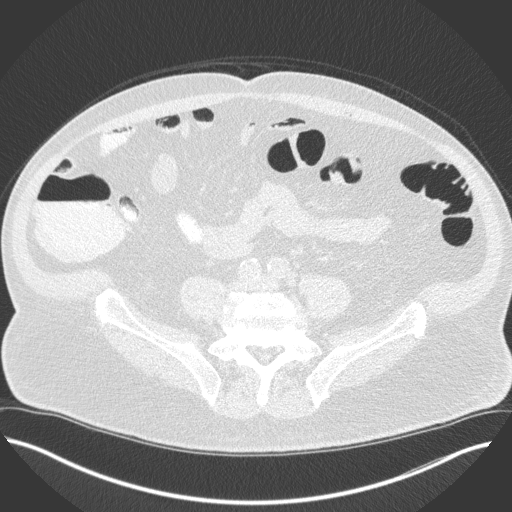
[im 541/974  mediastinal]
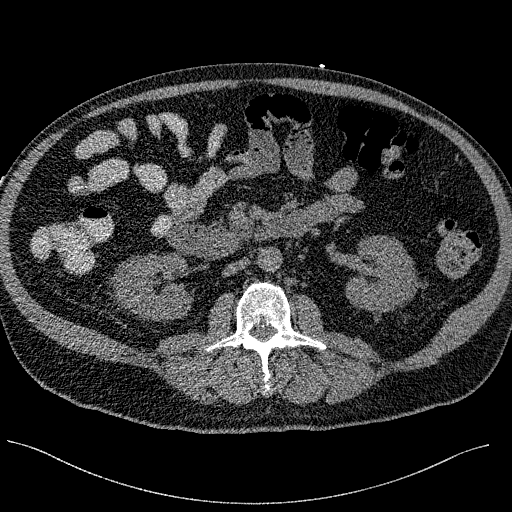
[im 541/974  lung]
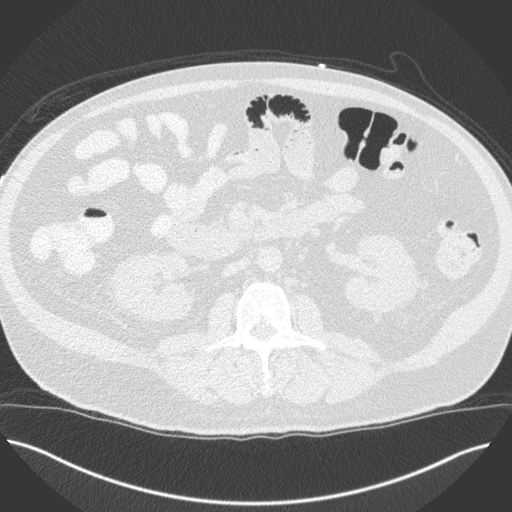
[im 649/974  lung]
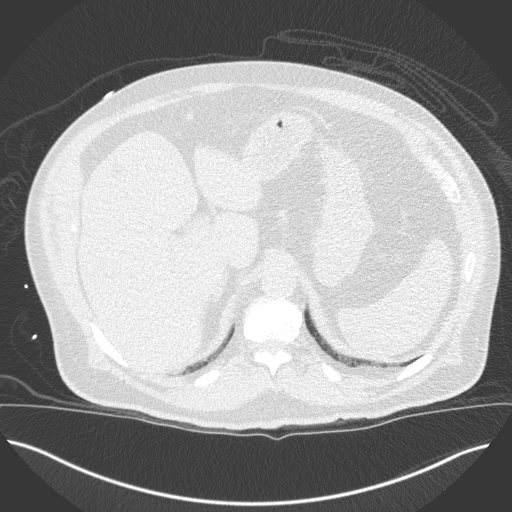
[im 757/974  lung]
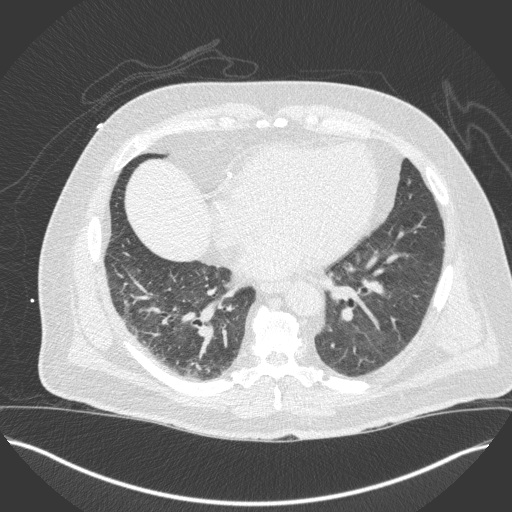
[im 865/974  lung]
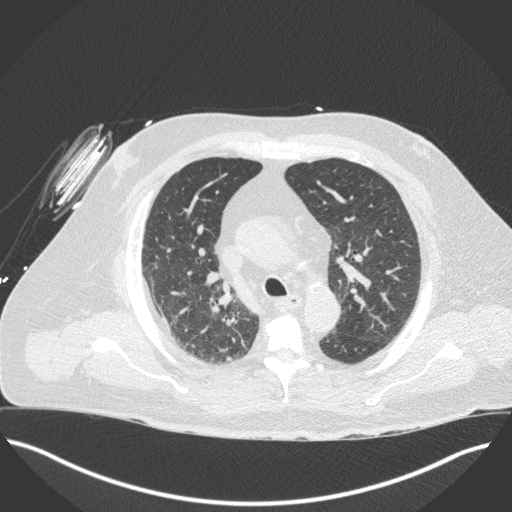

[15 of 36 positions shown; findings below may reference images not displayed]

FINDINGS: CT CHEST FINDINGS

Cardiovascular: No acute findings. Aortic and coronary artery
atherosclerosis noted.

Mediastinum/Lymph Nodes: No masses or pathologically enlarged lymph
nodes identified on this unenhanced exam.

Lungs/Pleura: Mild scarring is seen predominately involving the
right upper lobe. No evidence of pulmonary infiltrate or pleural
effusion. A 5 mm pulmonary nodule is seen in the right lung apex on
image [DATE].

Musculoskeletal: No suspicious bone lesions identified.

CT ABDOMEN AND PELVIS FINDINGS

Hepatobiliary: No masses visualized on this unenhanced exam.
Gallbladder is unremarkable. No evidence of biliary ductal
dilatation.

Pancreas: No mass or inflammatory changes identified on this
unenhanced exam.

Spleen: Within normal limits in size.

Adrenals/Urinary Tract: No evidence of urolithiasis or
hydronephrosis. Unremarkable appearance of bladder.

Stomach/Bowel: No evidence of obstruction, inflammatory process, or
abnormal fluid collections.

Vascular/Lymphatic: No pathologically enlarged lymph nodes
identified. No abdominal aortic aneurysm. Aortic atherosclerosis
noted.

Reproductive:  No masses or other significant abnormality.

Other:  None.

Musculoskeletal: No suspicious bone lesions identified. Increased
edema and new subcutaneous emphysema is seen throughout the
subcutaneous tissues of the left hip and proximal thigh compared to
recent exams. This is highly suspicious for necrotizing fasciitis.
IMPRESSION: 1. Increased edema and new emphysema throughout the subcutaneous
tissues of left hip and proximal thigh, highly suspicious for
necrotizing fasciitis.
2. 5 mm indeterminate right upper lobe pulmonary nodule. No
follow-up needed if patient is low-risk. Non-contrast chest CT can
be considered in 12 months if patient is high-risk. This
recommendation follows the consensus statement: Guidelines for
Management of Incidental Pulmonary Nodules Detected on CT Images:

Critical Value/emergent results were called by telephone at the time
of interpretation on 05/18/2019 at [DATE] to provider ZEINAB TIGER , who
verbally acknowledged these results.

Aortic Atherosclerosis ([id number]).

## 2021-08-18 MED ORDER — TRAMADOL HCL 50 MG PO TABS: 50.0000 mg | ORAL_TABLET | Freq: Four times a day (QID) | ORAL | 0 refills | Status: DC | PRN

## 2021-08-18 NOTE — Progress Notes (Signed)
Assessment: 1. Scrotal pain; chronic, bilateral   2. Microscopic hematuria     Plan:  I reviewed the patient's records including his prior scrotal ultrasound from April 2023 showing normal testes and a left epididymal cyst.   I discussed management of chronic scrotal pain.  I advised him that I do not recommend treatment of chronic pain with narcotic pain medication.  Referral to Dr. Cain Sieve at [facility] for chronic scrotal pain. I provided him with a refill of gabapentin and tramadol until he is able to see Dr. Cain Sieve Keep scheduled appointments for CT and cystoscopy for evaluation of microscopic hematuria.   Today, I personally spent 30 minutes in direct face to face time with the patient, of which greater than 50% of the time was spent in patient education and counseling as described above.   Chief Complaint:  Chief Complaint  Patient presents with   Testicle Pain   History of Present Illness:  [name] is a 66 y.o. year old male who is seen for further evaluation of bilateral scrotal pain.  At his initial visit in June 2023, he reported bilateral scrotal pain for approximately 2 years.  His symptoms have been intermittent in nature.  He reported increased pain with movement and certain positions.  He was evaluated with a scrotal ultrasound on 04/30/2021 which showed normal testes bilaterally and a 1 x 1 x 0.9 cm left epididymal cyst.  This cyst was stable in size as compared to a study from 9/22.  He was taking pain medication for his symptoms.  He had not been prescribed any antibiotics. He underwent incision and drainage for gangrene of his left groin area in 2021.  No obvious involvement of the scrotum.  He was not having any significant urinary symptoms other than nocturia.  No dysuria or gross hematuria. IPSS = 6.   He does report problems with achieving and maintaining erections.  Risk factors include coronary artery disease, diabetes, hypercholesterolemia,  beta-blocker use, and tobacco use.  He has previously used sildenafil 25 mg daily.  Consultation with his cardiologist was recommended prior to treatment with any additional oral therapy for erectile dysfunction.  Urinalysis from 6/23 showed 3-10 RBCs. He was treated with doxycycline x 2 weeks.  At his visit on 08/02/21, he continued to have intermittent bilateral scrotal pain with daily episodes.  He was taking tramadol and soaking in warm water for relief of his symptoms.  He noted a slight improvement in his symptoms following treatment with doxycycline.  No scrotal swelling or redness.  No dysuria or gross hematuria. IPSS = 1.  He was seen in the emergency room on 08/06/2021 complaining of bilateral scrotal pain.  He apparently had run out of tramadol and Neurontin.  He was given a small supply of both medications from the ER provider.  He returns today complaining of continued bilateral scrotal pain.  He reports that the only thing relieving his symptoms is the tramadol.  No scrotal swelling or redness.  No new urinary symptoms.  Portions of the above documentation were copied from a prior visit for review purposes only.   Past Medical History:  Past Medical History:  Diagnosis Date   Bulging lumbar disc    CAD (coronary artery disease) [phone]   a. prior LAD stenting. b. s/p DES to [facility] 08/2015. c. 04/2016 Cardiac cath at [facility]. Patent stent in the PLAD and RCA. Diffuse dLAD, OM2, and  RPDA disease. d. DES to PDA and distal  RCA 03/2019    Chronic lower back pain    CKD (chronic kidney disease), stage II    Cocaine abuse ([place])    DM2 (diabetes mellitus, type 2) (HCC)    Essential hypertension    GERD (gastroesophageal reflux disease)    Headache    History of pneumonia    Hyperlipidemia    Ischemic cardiomyopathy    LV (left ventricular) mural thrombus    Sleep apnea     Past Surgical History:  Past Surgical History:  Procedure Laterality Date   APPENDECTOMY      CARDIAC CATHETERIZATION N/A 09/07/2015   Procedure: Left Heart Cath and Coronary Angiography;  Surgeon: Leonie Man, MD;  Location: [place] [facility];  Service: Cardiovascular;  Laterality: N/A;   CARDIAC CATHETERIZATION N/A 09/07/2015   Procedure: Coronary Stent Intervention;  Surgeon: Leonie Man, MD;  Location: [place] [facility];  Service: Cardiovascular;  Laterality: N/A;   CORONARY ANGIOGRAM  09/07/13   residual RCA and OM disease   CORONARY ANGIOPLASTY WITH STENT PLACEMENT     CORONARY STENT INTERVENTION N/A 10/16/2018   Procedure: CORONARY STENT INTERVENTION;  Surgeon: Burnell Blanks, MD;  Location: [place] [facility];  Service: Cardiovascular;  Laterality: N/A;   CORONARY STENT INTERVENTION N/A 03/11/2019   Procedure: CORONARY STENT INTERVENTION;  Surgeon: [name], MD;  Location: [place] [facility];  Service: Cardiovascular;  Laterality: N/A;   FRACTURE SURGERY     INCISION AND DRAINAGE OF WOUND Left 05/19/2019   Procedure: DEBRIDEMENT LEFT GROIN;  Surgeon: Georganna Skeans, MD;  Location: [place];  Service: General;  Laterality: Left;   INSERTION OF ILIAC STENT Left 11/30/2017   Left external illiac stent   INSERTION OF ILIAC STENT  11/30/2017   Procedure: Insertion Of Iliac Stent;  Surgeon: Lorretta Harp, MD;  Location: [place] [facility];  Service: Cardiovascular;;  Left external illiac stent   KNEE ARTHROSCOPY Left    KNEE SURGERY     "ligaments, cartilage; tendon, put a pin in" (11/30/2017)   LEFT HEART CATH Bilateral 07/08/2012   Procedure: LEFT HEART CATH;  Surgeon: [name], MD;  Location: [facility] CATH LAB;  Service: Cardiovascular;  Laterality: Bilateral;   LEFT HEART CATH AND CORONARY ANGIOGRAPHY N/A 10/16/2018   Procedure: LEFT HEART CATH AND CORONARY ANGIOGRAPHY;  Surgeon: Burnell Blanks, MD;  Location: [place] [facility];  Service: Cardiovascular;  Laterality: N/A;   LEFT HEART CATH AND CORONARY ANGIOGRAPHY N/A 03/11/2019   Procedure:  LEFT HEART CATH AND CORONARY ANGIOGRAPHY;  Surgeon: [name], MD;  Location: [place] [facility];  Service: Cardiovascular;  Laterality: N/A;   LEFT HEART CATHETERIZATION WITH CORONARY ANGIOGRAM N/A 09/06/2013   STEMI, 2nd ISR LAD. Procedure: LEFT HEART CATHETERIZATION WITH CORONARY ANGIOGRAM;  Surgeon: [name], MD;  Location: [facility] CATH LAB;  Service: Cardiovascular;  Laterality: N/A;   LOWER EXTREMITY ANGIOGRAPHY N/A 11/30/2017   Procedure: LOWER EXTREMITY ANGIOGRAPHY;  Surgeon: Lorretta Harp, MD;  Location: [place] [facility];  Service: Cardiovascular;  Laterality: N/A;   PERCUTANEOUS CORONARY STENT INTERVENTION (PCI-S)  07/08/2012   Procedure: PERCUTANEOUS CORONARY STENT INTERVENTION (PCI-S);  Surgeon: [name], MD;  Location: [facility] CATH LAB;  Service: Cardiovascular;;  DES LAD   PERCUTANEOUS CORONARY STENT INTERVENTION (PCI-S) N/A 09/06/2013   Procedure: PERCUTANEOUS CORONARY STENT INTERVENTION (PCI-S);  Surgeon: [name], MD;  Location: [facility] CATH LAB;  Service: Cardiovascular;  Laterality: N/A;  Mid LAD 3.0/24mm  Promus   WOUND DEBRIDEMENT Left 05/20/2019   Procedure: DEBRIDEMENT GROIN;  Surgeon: Kinsinger, Arta Bruce, MD;  Location: [place];  Service: General;  Laterality: Left;   WRIST FRACTURE SURGERY Bilateral     Allergies:  Allergies  Allergen Reactions   Clopidogrel     Other reaction(s): Drowsy, Skin irritation    Family History:  Family History  Problem Relation Age of Onset   Hypertension Mother    Diabetes Mother     Social History:  Social History   Tobacco Use   Smoking status: Every Day    Packs/day: 0.50    Years: 48.00    Total pack years: 24.00    Types: Cigarettes   Smokeless tobacco: Never  Vaping Use   Vaping Use: Never used  Substance Use Topics   Alcohol use: Not Currently    Comment: 11/30/2017 "might drink a beer q 6 months"   Drug use: Never    ROS: Constitutional:  Negative for fever, chills, weight  loss CV: Negative for chest pain, previous MI, hypertension Respiratory:  Negative for shortness of breath, wheezing, sleep apnea, frequent cough GI:  Negative for nausea, vomiting, bloody stool, GERD  Physical exam: BP 114/78   Pulse 98  GENERAL APPEARANCE:  Well appearing, well developed, well nourished, NAD HEENT:  Atraumatic, normocephalic, oropharynx clear NECK:  Supple without lymphadenopathy or thyromegaly ABDOMEN:  Soft, non-tender, no masses EXTREMITIES:  Moves all extremities well, without clubbing, cyanosis, or edema NEUROLOGIC:  Alert and oriented x 3, normal gait, CN II-XII grossly intact MENTAL STATUS:  appropriate BACK:  Non-tender to palpation, No CVAT SKIN:  Warm, dry, and intact GU: Scrotum: no erythema or edema Testis: NT Epididymis: tenderness: bilateral   Results: None

## 2021-08-27 DIAGNOSIS — I509 Heart failure, unspecified: Secondary | ICD-10-CM | POA: Diagnosis not present

## 2021-08-27 DIAGNOSIS — R262 Difficulty in walking, not elsewhere classified: Secondary | ICD-10-CM | POA: Diagnosis not present

## 2021-08-27 DIAGNOSIS — E118 Type 2 diabetes mellitus with unspecified complications: Secondary | ICD-10-CM | POA: Diagnosis not present

## 2021-08-27 DIAGNOSIS — I2510 Atherosclerotic heart disease of native coronary artery without angina pectoris: Secondary | ICD-10-CM | POA: Diagnosis not present

## 2021-08-27 DIAGNOSIS — N451 Epididymitis: Secondary | ICD-10-CM | POA: Diagnosis not present

## 2021-09-01 ENCOUNTER — Other Ambulatory Visit: Payer: No Typology Code available for payment source | Admitting: Urology

## 2021-09-01 NOTE — Progress Notes (Deleted)
Assessment: 1. Microscopic hematuria   2. Scrotal pain; chronic, bilateral     Plan:  I reviewed the patient's records including his prior scrotal ultrasound from April 2023 showing normal testes and a left epididymal cyst.   I discussed management of chronic scrotal pain.  I advised him that I do not recommend treatment of chronic pain with narcotic pain medication.  Referral to Dr. Cain Sieve at [facility] for chronic scrotal pain. I provided him with a refill of gabapentin and tramadol until he is able to see Dr. Cain Sieve Keep scheduled appointments for CT and cystoscopy for evaluation of microscopic hematuria.   Chief Complaint:  No chief complaint on file.  History of Present Illness:  [name] is a 66 y.o. year old male who is seen for further evaluation of bilateral scrotal pain.  At his initial visit in June 2023, he reported bilateral scrotal pain for approximately 2 years.  His symptoms have been intermittent in nature.  He reported increased pain with movement and certain positions.  He was evaluated with a scrotal ultrasound on 04/30/2021 which showed normal testes bilaterally and a 1 x 1 x 0.9 cm left epididymal cyst.  This cyst was stable in size as compared to a study from 9/22.  He was taking pain medication for his symptoms.  He had not been prescribed any antibiotics. He underwent incision and drainage for gangrene of his left groin area in 2021.  No obvious involvement of the scrotum.  He was not having any significant urinary symptoms other than nocturia.  No dysuria or gross hematuria. IPSS = 6.   He does report problems with achieving and maintaining erections.  Risk factors include coronary artery disease, diabetes, hypercholesterolemia, beta-blocker use, and tobacco use.  He has previously used sildenafil 25 mg daily.  Consultation with his cardiologist was recommended prior to treatment with any additional oral therapy for erectile  dysfunction.  Urinalysis from 6/23 showed 3-10 RBCs. He was treated with doxycycline x 2 weeks.  At his visit on 08/02/21, he continued to have intermittent bilateral scrotal pain with daily episodes.  He was taking tramadol and soaking in warm water for relief of his symptoms.  He noted a slight improvement in his symptoms following treatment with doxycycline.  No scrotal swelling or redness.  No dysuria or gross hematuria. IPSS = 1.  He was seen in the emergency room on 08/06/2021 complaining of bilateral scrotal pain.  He apparently had run out of tramadol and Neurontin.  He was given a small supply of both medications from the ER provider.  He was seen on 08/18/21 complaining of continued bilateral scrotal pain.  He reported that the only thing the relieves his symptoms is the tramadol.  No scrotal swelling or redness.  No new urinary symptoms.  Portions of the above documentation were copied from a prior visit for review purposes only.   Past Medical History:  Past Medical History:  Diagnosis Date   Bulging lumbar disc    CAD (coronary artery disease) [phone]   a. prior LAD stenting. b. s/p DES to [facility] 08/2015. c. 04/2016 Cardiac cath at [facility]. Patent stent in the PLAD and RCA. Diffuse dLAD, OM2, and  RPDA disease. d. DES to PDA and distal RCA 03/2019    Chronic lower back pain    CKD (chronic kidney disease), stage II    Cocaine abuse ([place])    DM2 (diabetes mellitus, type 2) ([place])    Essential hypertension  GERD (gastroesophageal reflux disease)    Headache    History of pneumonia    Hyperlipidemia    Ischemic cardiomyopathy    LV (left ventricular) mural thrombus    Sleep apnea     Past Surgical History:  Past Surgical History:  Procedure Laterality Date   APPENDECTOMY     CARDIAC CATHETERIZATION N/A 09/07/2015   Procedure: Left Heart Cath and Coronary Angiography;  Surgeon: Leonie Man, MD;  Location: [place] [facility];  Service: Cardiovascular;  Laterality:  N/A;   CARDIAC CATHETERIZATION N/A 09/07/2015   Procedure: Coronary Stent Intervention;  Surgeon: Leonie Man, MD;  Location: [place] [facility];  Service: Cardiovascular;  Laterality: N/A;   CORONARY ANGIOGRAM  09/07/13   residual RCA and OM disease   CORONARY ANGIOPLASTY WITH STENT PLACEMENT     CORONARY STENT INTERVENTION N/A 10/16/2018   Procedure: CORONARY STENT INTERVENTION;  Surgeon: Burnell Blanks, MD;  Location: [place] [facility];  Service: Cardiovascular;  Laterality: N/A;   CORONARY STENT INTERVENTION N/A 03/11/2019   Procedure: CORONARY STENT INTERVENTION;  Surgeon: [name], MD;  Location: [place] [facility];  Service: Cardiovascular;  Laterality: N/A;   FRACTURE SURGERY     INCISION AND DRAINAGE OF WOUND Left 05/19/2019   Procedure: DEBRIDEMENT LEFT GROIN;  Surgeon: Georganna Skeans, MD;  Location: [place];  Service: General;  Laterality: Left;   INSERTION OF ILIAC STENT Left 11/30/2017   Left external illiac stent   INSERTION OF ILIAC STENT  11/30/2017   Procedure: Insertion Of Iliac Stent;  Surgeon: Lorretta Harp, MD;  Location: [place] [facility];  Service: Cardiovascular;;  Left external illiac stent   KNEE ARTHROSCOPY Left    KNEE SURGERY     "ligaments, cartilage; tendon, put a pin in" (11/30/2017)   LEFT HEART CATH Bilateral 07/08/2012   Procedure: LEFT HEART CATH;  Surgeon: [name], MD;  Location: [facility] CATH LAB;  Service: Cardiovascular;  Laterality: Bilateral;   LEFT HEART CATH AND CORONARY ANGIOGRAPHY N/A 10/16/2018   Procedure: LEFT HEART CATH AND CORONARY ANGIOGRAPHY;  Surgeon: Burnell Blanks, MD;  Location: [place] [facility];  Service: Cardiovascular;  Laterality: N/A;   LEFT HEART CATH AND CORONARY ANGIOGRAPHY N/A 03/11/2019   Procedure: LEFT HEART CATH AND CORONARY ANGIOGRAPHY;  Surgeon: [name], MD;  Location: [place] [facility];  Service: Cardiovascular;  Laterality: N/A;   LEFT HEART CATHETERIZATION WITH  CORONARY ANGIOGRAM N/A 09/06/2013   STEMI, 2nd ISR LAD. Procedure: LEFT HEART CATHETERIZATION WITH CORONARY ANGIOGRAM;  Surgeon: [name], MD;  Location: [facility] CATH LAB;  Service: Cardiovascular;  Laterality: N/A;   LOWER EXTREMITY ANGIOGRAPHY N/A 11/30/2017   Procedure: LOWER EXTREMITY ANGIOGRAPHY;  Surgeon: Lorretta Harp, MD;  Location: [place] [facility];  Service: Cardiovascular;  Laterality: N/A;   PERCUTANEOUS CORONARY STENT INTERVENTION (PCI-S)  07/08/2012   Procedure: PERCUTANEOUS CORONARY STENT INTERVENTION (PCI-S);  Surgeon: [name], MD;  Location: [facility] CATH LAB;  Service: Cardiovascular;;  DES LAD   PERCUTANEOUS CORONARY STENT INTERVENTION (PCI-S) N/A 09/06/2013   Procedure: PERCUTANEOUS CORONARY STENT INTERVENTION (PCI-S);  Surgeon: [name], MD;  Location: [facility] CATH LAB;  Service: Cardiovascular;  Laterality: N/A;  Mid LAD 3.0/24mm Promus   WOUND DEBRIDEMENT Left 05/20/2019   Procedure: DEBRIDEMENT GROIN;  Surgeon: Kinsinger, Arta Bruce, MD;  Location: [place];  Service: General;  Laterality: Left;   WRIST FRACTURE SURGERY Bilateral     Allergies:  Allergies  Allergen Reactions   Clopidogrel     Other reaction(s): Drowsy, Skin irritation    Family History:  Family History  Problem Relation Age of Onset   Hypertension Mother    Diabetes Mother     Social History:  Social History   Tobacco Use   Smoking status: Every Day    Packs/day: 0.50    Years: 48.00    Total pack years: 24.00    Types: Cigarettes   Smokeless tobacco: Never  Vaping Use   Vaping Use: Never used  Substance Use Topics   Alcohol use: Not Currently    Comment: 11/30/2017 "might drink a beer q 6 months"   Drug use: Never   ROS: Constitutional:  Negative for fever, chills, weight loss CV: Negative for chest pain, previous MI, hypertension Respiratory:  Negative for shortness of breath, wheezing, sleep apnea, frequent cough GI:  Negative for nausea, vomiting, bloody  stool, GERD  Physical exam: There were no vitals taken for this visit. GENERAL APPEARANCE:  Well appearing, well developed, well nourished, NAD HEENT:  Atraumatic, normocephalic, oropharynx clear NECK:  Supple without lymphadenopathy or thyromegaly ABDOMEN:  Soft, non-tender, no masses EXTREMITIES:  Moves all extremities well, without clubbing, cyanosis, or edema NEUROLOGIC:  Alert and oriented x 3, normal gait, CN II-XII grossly intact MENTAL STATUS:  appropriate BACK:  Non-tender to palpation, No CVAT SKIN:  Warm, dry, and intact   Results: U/A:  Procedure:  Flexible Cystourethroscopy  Pre-operative Diagnosis: {cysto diagnosis:26394}  Post-operative Diagnosis: {cysto diagnosis:26394}  Anesthesia:  local with lidocaine jelly  Surgical Narrative:  After appropriate informed consent was obtained, the patient was prepped and draped in the usual sterile fashion in the supine position.  The patient was correctly identified and the proper procedure delineated prior to proceeding.  Sterile lidocaine gel was instilled in the urethra. The flexible cystoscope was introduced without difficulty.  Findings:  Anterior urethra: {anterior urethral findings:26395}  Posterior urethra: {post urethral findings:26396}  Bladder: {bladder findings:26397}  Ureteral orifices: {Normal/Abnormal Appearance:21344::"normal"}  Additional findings:  Saline bladder wash for cytology {WAS/WAS NOT:[phone]::"was not"} performed.    The cystoscope was then removed.  The patient tolerated the procedure well.

## 2021-09-02 DIAGNOSIS — N390 Urinary tract infection, site not specified: Secondary | ICD-10-CM | POA: Diagnosis not present

## 2021-09-02 DIAGNOSIS — I10 Essential (primary) hypertension: Secondary | ICD-10-CM | POA: Diagnosis not present

## 2021-09-02 DIAGNOSIS — E039 Hypothyroidism, unspecified: Secondary | ICD-10-CM | POA: Diagnosis not present

## 2021-09-02 DIAGNOSIS — E559 Vitamin D deficiency, unspecified: Secondary | ICD-10-CM | POA: Diagnosis not present

## 2021-09-02 DIAGNOSIS — E118 Type 2 diabetes mellitus with unspecified complications: Secondary | ICD-10-CM | POA: Diagnosis not present

## 2021-09-02 DIAGNOSIS — E782 Mixed hyperlipidemia: Secondary | ICD-10-CM | POA: Diagnosis not present

## 2021-09-08 ENCOUNTER — Other Ambulatory Visit: Payer: Self-pay | Admitting: Gerontology

## 2021-09-08 ENCOUNTER — Other Ambulatory Visit (HOSPITAL_COMMUNITY): Payer: Self-pay | Admitting: Gerontology

## 2021-09-08 DIAGNOSIS — Z1389 Encounter for screening for other disorder: Secondary | ICD-10-CM | POA: Diagnosis not present

## 2021-09-08 DIAGNOSIS — Z87891 Personal history of nicotine dependence: Secondary | ICD-10-CM

## 2021-09-08 DIAGNOSIS — E782 Mixed hyperlipidemia: Secondary | ICD-10-CM | POA: Diagnosis not present

## 2021-09-08 DIAGNOSIS — Z23 Encounter for immunization: Secondary | ICD-10-CM | POA: Diagnosis not present

## 2021-09-08 DIAGNOSIS — Z0001 Encounter for general adult medical examination with abnormal findings: Secondary | ICD-10-CM | POA: Diagnosis not present

## 2021-09-08 DIAGNOSIS — F17210 Nicotine dependence, cigarettes, uncomplicated: Secondary | ICD-10-CM | POA: Diagnosis not present

## 2021-09-08 DIAGNOSIS — E118 Type 2 diabetes mellitus with unspecified complications: Secondary | ICD-10-CM | POA: Diagnosis not present

## 2021-09-08 DIAGNOSIS — R262 Difficulty in walking, not elsewhere classified: Secondary | ICD-10-CM | POA: Diagnosis not present

## 2021-09-08 DIAGNOSIS — J42 Unspecified chronic bronchitis: Secondary | ICD-10-CM | POA: Diagnosis not present

## 2021-10-08 DIAGNOSIS — E118 Type 2 diabetes mellitus with unspecified complications: Secondary | ICD-10-CM | POA: Diagnosis not present

## 2021-10-08 DIAGNOSIS — R262 Difficulty in walking, not elsewhere classified: Secondary | ICD-10-CM | POA: Diagnosis not present

## 2021-10-08 DIAGNOSIS — Z23 Encounter for immunization: Secondary | ICD-10-CM | POA: Diagnosis not present

## 2021-10-08 DIAGNOSIS — I509 Heart failure, unspecified: Secondary | ICD-10-CM | POA: Diagnosis not present

## 2021-10-28 ENCOUNTER — Ambulatory Visit (HOSPITAL_COMMUNITY)
Admission: RE | Admit: 2021-10-28 | Discharge: 2021-10-28 | Disposition: A | Discharge status: Home / Self Care | Payer: No Typology Code available for payment source | Source: Ambulatory Visit | Attending: Gerontology | Admitting: Gerontology

## 2021-10-28 ENCOUNTER — Other Ambulatory Visit (HOSPITAL_COMMUNITY): Payer: Self-pay | Admitting: Gerontology

## 2021-10-28 DIAGNOSIS — R296 Repeated falls: Secondary | ICD-10-CM | POA: Insufficient documentation

## 2021-10-28 DIAGNOSIS — W19XXXA Unspecified fall, initial encounter: Secondary | ICD-10-CM | POA: Diagnosis not present

## 2021-10-28 DIAGNOSIS — R262 Difficulty in walking, not elsewhere classified: Secondary | ICD-10-CM | POA: Diagnosis not present

## 2021-10-28 DIAGNOSIS — E118 Type 2 diabetes mellitus with unspecified complications: Secondary | ICD-10-CM | POA: Diagnosis not present

## 2021-11-06 DIAGNOSIS — G9341 Metabolic encephalopathy: Secondary | ICD-10-CM | POA: Diagnosis present

## 2021-11-06 DIAGNOSIS — N1832 Chronic kidney disease, stage 3b: Secondary | ICD-10-CM | POA: Diagnosis present

## 2021-11-06 DIAGNOSIS — J9601 Acute respiratory failure with hypoxia: Secondary | ICD-10-CM | POA: Diagnosis present

## 2021-11-06 DIAGNOSIS — G8929 Other chronic pain: Secondary | ICD-10-CM | POA: Diagnosis present

## 2021-11-06 DIAGNOSIS — N179 Acute kidney failure, unspecified: Secondary | ICD-10-CM | POA: Diagnosis present

## 2021-11-06 DIAGNOSIS — R188 Other ascites: Secondary | ICD-10-CM | POA: Diagnosis present

## 2021-11-06 DIAGNOSIS — I5023 Acute on chronic systolic (congestive) heart failure: Secondary | ICD-10-CM | POA: Diagnosis present

## 2021-11-06 DIAGNOSIS — E1122 Type 2 diabetes mellitus with diabetic chronic kidney disease: Secondary | ICD-10-CM | POA: Diagnosis present

## 2021-11-06 DIAGNOSIS — E1165 Type 2 diabetes mellitus with hyperglycemia: Secondary | ICD-10-CM | POA: Diagnosis present

## 2021-11-06 DIAGNOSIS — Z888 Allergy status to other drugs, medicaments and biological substances status: Secondary | ICD-10-CM

## 2021-11-06 DIAGNOSIS — I130 Hypertensive heart and chronic kidney disease with heart failure and stage 1 through stage 4 chronic kidney disease, or unspecified chronic kidney disease: Principal | ICD-10-CM | POA: Diagnosis present

## 2021-11-06 DIAGNOSIS — I255 Ischemic cardiomyopathy: Secondary | ICD-10-CM | POA: Diagnosis present

## 2021-11-06 DIAGNOSIS — D329 Benign neoplasm of meninges, unspecified: Secondary | ICD-10-CM | POA: Diagnosis present

## 2021-11-06 DIAGNOSIS — E46 Unspecified protein-calorie malnutrition: Secondary | ICD-10-CM | POA: Diagnosis present

## 2021-11-06 DIAGNOSIS — F17210 Nicotine dependence, cigarettes, uncomplicated: Secondary | ICD-10-CM | POA: Diagnosis present

## 2021-11-06 DIAGNOSIS — K219 Gastro-esophageal reflux disease without esophagitis: Secondary | ICD-10-CM | POA: Diagnosis present

## 2021-11-06 DIAGNOSIS — Z91199 Patient's noncompliance with other medical treatment and regimen due to unspecified reason: Secondary | ICD-10-CM

## 2021-11-06 DIAGNOSIS — N50819 Testicular pain, unspecified: Secondary | ICD-10-CM | POA: Diagnosis present

## 2021-11-06 DIAGNOSIS — I5021 Acute systolic (congestive) heart failure: Secondary | ICD-10-CM | POA: Diagnosis not present

## 2021-11-06 DIAGNOSIS — I444 Left anterior fascicular block: Secondary | ICD-10-CM | POA: Diagnosis present

## 2021-11-06 DIAGNOSIS — G4733 Obstructive sleep apnea (adult) (pediatric): Secondary | ICD-10-CM | POA: Diagnosis present

## 2021-11-06 DIAGNOSIS — Z1152 Encounter for screening for COVID-19: Secondary | ICD-10-CM

## 2021-11-06 DIAGNOSIS — G936 Cerebral edema: Secondary | ICD-10-CM | POA: Diagnosis present

## 2021-11-06 DIAGNOSIS — Z95828 Presence of other vascular implants and grafts: Secondary | ICD-10-CM

## 2021-11-06 DIAGNOSIS — Z833 Family history of diabetes mellitus: Secondary | ICD-10-CM

## 2021-11-06 DIAGNOSIS — Z6831 Body mass index (BMI) 31.0-31.9, adult: Secondary | ICD-10-CM

## 2021-11-06 DIAGNOSIS — E1151 Type 2 diabetes mellitus with diabetic peripheral angiopathy without gangrene: Secondary | ICD-10-CM | POA: Diagnosis present

## 2021-11-06 DIAGNOSIS — Z955 Presence of coronary angioplasty implant and graft: Secondary | ICD-10-CM

## 2021-11-06 DIAGNOSIS — Z8249 Family history of ischemic heart disease and other diseases of the circulatory system: Secondary | ICD-10-CM

## 2021-11-06 DIAGNOSIS — Z91148 Patient's other noncompliance with medication regimen for other reason: Secondary | ICD-10-CM

## 2021-11-06 DIAGNOSIS — Z79899 Other long term (current) drug therapy: Secondary | ICD-10-CM

## 2021-11-06 DIAGNOSIS — I252 Old myocardial infarction: Secondary | ICD-10-CM

## 2021-11-06 DIAGNOSIS — I2510 Atherosclerotic heart disease of native coronary artery without angina pectoris: Secondary | ICD-10-CM | POA: Diagnosis present

## 2021-11-06 DIAGNOSIS — E785 Hyperlipidemia, unspecified: Secondary | ICD-10-CM | POA: Diagnosis present

## 2021-11-06 DIAGNOSIS — J918 Pleural effusion in other conditions classified elsewhere: Secondary | ICD-10-CM | POA: Diagnosis present

## 2021-11-06 DIAGNOSIS — R590 Localized enlarged lymph nodes: Secondary | ICD-10-CM | POA: Diagnosis present

## 2021-11-07 ENCOUNTER — Other Ambulatory Visit (HOSPITAL_COMMUNITY): Payer: Self-pay | Admitting: *Deleted

## 2021-11-07 ENCOUNTER — Encounter (HOSPITAL_COMMUNITY): Payer: Self-pay | Admitting: Emergency Medicine

## 2021-11-07 ENCOUNTER — Inpatient Hospital Stay (HOSPITAL_COMMUNITY): Payer: No Typology Code available for payment source

## 2021-11-07 ENCOUNTER — Emergency Department (HOSPITAL_COMMUNITY): Payer: No Typology Code available for payment source

## 2021-11-07 ENCOUNTER — Inpatient Hospital Stay (HOSPITAL_COMMUNITY)
Admission: EM | Admit: 2021-11-07 | Discharge: 2021-11-13 | DRG: 291 | Disposition: A | Discharge status: Home Health | Payer: No Typology Code available for payment source | Attending: Internal Medicine | Admitting: Internal Medicine

## 2021-11-07 ENCOUNTER — Other Ambulatory Visit (HOSPITAL_COMMUNITY): Payer: No Typology Code available for payment source

## 2021-11-07 DIAGNOSIS — E440 Moderate protein-calorie malnutrition: Secondary | ICD-10-CM | POA: Diagnosis not present

## 2021-11-07 DIAGNOSIS — R7989 Other specified abnormal findings of blood chemistry: Secondary | ICD-10-CM | POA: Insufficient documentation

## 2021-11-07 DIAGNOSIS — J9601 Acute respiratory failure with hypoxia: Secondary | ICD-10-CM | POA: Insufficient documentation

## 2021-11-07 DIAGNOSIS — N179 Acute kidney failure, unspecified: Secondary | ICD-10-CM | POA: Diagnosis present

## 2021-11-07 DIAGNOSIS — R188 Other ascites: Secondary | ICD-10-CM | POA: Diagnosis present

## 2021-11-07 DIAGNOSIS — I252 Old myocardial infarction: Secondary | ICD-10-CM | POA: Diagnosis not present

## 2021-11-07 DIAGNOSIS — R9389 Abnormal findings on diagnostic imaging of other specified body structures: Secondary | ICD-10-CM | POA: Diagnosis not present

## 2021-11-07 DIAGNOSIS — I639 Cerebral infarction, unspecified: Secondary | ICD-10-CM | POA: Diagnosis not present

## 2021-11-07 DIAGNOSIS — I739 Peripheral vascular disease, unspecified: Secondary | ICD-10-CM

## 2021-11-07 DIAGNOSIS — E785 Hyperlipidemia, unspecified: Secondary | ICD-10-CM | POA: Diagnosis present

## 2021-11-07 DIAGNOSIS — Z79899 Other long term (current) drug therapy: Secondary | ICD-10-CM | POA: Diagnosis not present

## 2021-11-07 DIAGNOSIS — G4733 Obstructive sleep apnea (adult) (pediatric): Secondary | ICD-10-CM | POA: Diagnosis present

## 2021-11-07 DIAGNOSIS — I25119 Atherosclerotic heart disease of native coronary artery with unspecified angina pectoris: Secondary | ICD-10-CM | POA: Diagnosis not present

## 2021-11-07 DIAGNOSIS — R590 Localized enlarged lymph nodes: Secondary | ICD-10-CM | POA: Diagnosis not present

## 2021-11-07 DIAGNOSIS — I130 Hypertensive heart and chronic kidney disease with heart failure and stage 1 through stage 4 chronic kidney disease, or unspecified chronic kidney disease: Secondary | ICD-10-CM | POA: Diagnosis present

## 2021-11-07 DIAGNOSIS — Z8249 Family history of ischemic heart disease and other diseases of the circulatory system: Secondary | ICD-10-CM | POA: Diagnosis not present

## 2021-11-07 DIAGNOSIS — E1165 Type 2 diabetes mellitus with hyperglycemia: Secondary | ICD-10-CM | POA: Diagnosis present

## 2021-11-07 DIAGNOSIS — E8779 Other fluid overload: Secondary | ICD-10-CM | POA: Insufficient documentation

## 2021-11-07 DIAGNOSIS — R4182 Altered mental status, unspecified: Secondary | ICD-10-CM | POA: Diagnosis not present

## 2021-11-07 DIAGNOSIS — Z6831 Body mass index (BMI) 31.0-31.9, adult: Secondary | ICD-10-CM | POA: Diagnosis not present

## 2021-11-07 DIAGNOSIS — G936 Cerebral edema: Secondary | ICD-10-CM | POA: Diagnosis present

## 2021-11-07 DIAGNOSIS — Z955 Presence of coronary angioplasty implant and graft: Secondary | ICD-10-CM | POA: Diagnosis not present

## 2021-11-07 DIAGNOSIS — Z95828 Presence of other vascular implants and grafts: Secondary | ICD-10-CM | POA: Diagnosis not present

## 2021-11-07 DIAGNOSIS — Z1152 Encounter for screening for COVID-19: Secondary | ICD-10-CM | POA: Diagnosis not present

## 2021-11-07 DIAGNOSIS — I110 Hypertensive heart disease with heart failure: Secondary | ICD-10-CM | POA: Diagnosis not present

## 2021-11-07 DIAGNOSIS — N1832 Chronic kidney disease, stage 3b: Secondary | ICD-10-CM | POA: Diagnosis present

## 2021-11-07 DIAGNOSIS — I6389 Other cerebral infarction: Secondary | ICD-10-CM

## 2021-11-07 DIAGNOSIS — R410 Disorientation, unspecified: Secondary | ICD-10-CM

## 2021-11-07 DIAGNOSIS — D496 Neoplasm of unspecified behavior of brain: Secondary | ICD-10-CM | POA: Diagnosis not present

## 2021-11-07 DIAGNOSIS — F17200 Nicotine dependence, unspecified, uncomplicated: Secondary | ICD-10-CM | POA: Diagnosis not present

## 2021-11-07 DIAGNOSIS — G9341 Metabolic encephalopathy: Secondary | ICD-10-CM | POA: Diagnosis present

## 2021-11-07 DIAGNOSIS — I5021 Acute systolic (congestive) heart failure: Principal | ICD-10-CM | POA: Insufficient documentation

## 2021-11-07 DIAGNOSIS — I5023 Acute on chronic systolic (congestive) heart failure: Secondary | ICD-10-CM | POA: Diagnosis present

## 2021-11-07 DIAGNOSIS — J918 Pleural effusion in other conditions classified elsewhere: Secondary | ICD-10-CM | POA: Diagnosis present

## 2021-11-07 DIAGNOSIS — J90 Pleural effusion, not elsewhere classified: Secondary | ICD-10-CM | POA: Diagnosis not present

## 2021-11-07 DIAGNOSIS — E1151 Type 2 diabetes mellitus with diabetic peripheral angiopathy without gangrene: Secondary | ICD-10-CM | POA: Diagnosis present

## 2021-11-07 DIAGNOSIS — I509 Heart failure, unspecified: Secondary | ICD-10-CM | POA: Diagnosis not present

## 2021-11-07 DIAGNOSIS — E1122 Type 2 diabetes mellitus with diabetic chronic kidney disease: Secondary | ICD-10-CM | POA: Diagnosis present

## 2021-11-07 DIAGNOSIS — E46 Unspecified protein-calorie malnutrition: Secondary | ICD-10-CM | POA: Diagnosis present

## 2021-11-07 DIAGNOSIS — F17210 Nicotine dependence, cigarettes, uncomplicated: Secondary | ICD-10-CM | POA: Diagnosis present

## 2021-11-07 DIAGNOSIS — N289 Disorder of kidney and ureter, unspecified: Secondary | ICD-10-CM

## 2021-11-07 DIAGNOSIS — I2510 Atherosclerotic heart disease of native coronary artery without angina pectoris: Secondary | ICD-10-CM | POA: Diagnosis present

## 2021-11-07 DIAGNOSIS — D329 Benign neoplasm of meninges, unspecified: Secondary | ICD-10-CM | POA: Diagnosis present

## 2021-11-07 DIAGNOSIS — D649 Anemia, unspecified: Secondary | ICD-10-CM

## 2021-11-07 LAB — URINALYSIS, ROUTINE W REFLEX MICROSCOPIC
Bacteria, UA: NONE SEEN
Bilirubin Urine: NEGATIVE
Glucose, UA: 50 mg/dL — AB
Hgb urine dipstick: NEGATIVE
Ketones, ur: NEGATIVE mg/dL
Leukocytes,Ua: NEGATIVE
Nitrite: NEGATIVE
Protein, ur: >=300 mg/dL — AB
Specific Gravity, Urine: 1.027 (ref 1.005–1.030)
pH: 5 (ref 5.0–8.0)

## 2021-11-07 LAB — CBC
HCT: 38.6 % — ABNORMAL LOW (ref 39.0–52.0)
Hemoglobin: 11.8 g/dL — ABNORMAL LOW (ref 13.0–17.0)
MCH: 24.9 pg — ABNORMAL LOW (ref 26.0–34.0)
MCHC: 30.6 g/dL (ref 30.0–36.0)
MCV: 81.4 fL (ref 80.0–100.0)
Platelets: 265 10*3/uL (ref 150–400)
RBC: 4.74 MIL/uL (ref 4.22–5.81)
RDW: 21.5 % — ABNORMAL HIGH (ref 11.5–15.5)
WBC: 11 10*3/uL — ABNORMAL HIGH (ref 4.0–10.5)
nRBC: 0.2 % (ref 0.0–0.2)

## 2021-11-07 LAB — TSH: TSH: 2.489 u[IU]/mL (ref 0.350–4.500)

## 2021-11-07 LAB — ECHOCARDIOGRAM COMPLETE
Area-P 1/2: 5.54 cm2
Height: 69 in
S' Lateral: 4.6 cm
Weight: 3597.91 oz

## 2021-11-07 LAB — RAPID URINE DRUG SCREEN, HOSP PERFORMED
Amphetamines: NOT DETECTED
Barbiturates: NOT DETECTED
Benzodiazepines: NOT DETECTED
Cocaine: NOT DETECTED
Opiates: NOT DETECTED
Tetrahydrocannabinol: NOT DETECTED

## 2021-11-07 LAB — BLOOD GAS, VENOUS
Acid-Base Excess: 7 mmol/L — ABNORMAL HIGH (ref 0.0–2.0)
Bicarbonate: 34.5 mmol/L — ABNORMAL HIGH (ref 20.0–28.0)
Drawn by: 27160
O2 Saturation: 16.8 %
Patient temperature: 36.4
pCO2, Ven: 59 mmHg (ref 44–60)
pH, Ven: 7.37 (ref 7.25–7.43)
pO2, Ven: <31 mmHg — CL (ref 32–45)

## 2021-11-07 LAB — GLUCOSE, CAPILLARY: Glucose-Capillary: 134 mg/dL — ABNORMAL HIGH (ref 70–99)

## 2021-11-07 LAB — TROPONIN I (HIGH SENSITIVITY)
Troponin I (High Sensitivity): 31 ng/L — ABNORMAL HIGH (ref <18)
Troponin I (High Sensitivity): 38 ng/L — ABNORMAL HIGH (ref <18)

## 2021-11-07 LAB — BRAIN NATRIURETIC PEPTIDE: B Natriuretic Peptide: 1157 pg/mL — ABNORMAL HIGH (ref 0.0–100.0)

## 2021-11-07 LAB — FOLATE: Folate: 6.5 ng/mL (ref >5.9)

## 2021-11-07 LAB — HIV ANTIBODY (ROUTINE TESTING W REFLEX): HIV Screen 4th Generation wRfx: NONREACTIVE

## 2021-11-07 LAB — LIPASE, BLOOD: Lipase: 24 U/L (ref 11–51)

## 2021-11-07 LAB — CBG MONITORING, ED
Glucose-Capillary: 225 mg/dL — ABNORMAL HIGH (ref 70–99)
Glucose-Capillary: 146 mg/dL — ABNORMAL HIGH (ref 70–99)

## 2021-11-07 LAB — VITAMIN B12: Vitamin B-12: 793 pg/mL (ref 180–914)

## 2021-11-07 MED ORDER — FUROSEMIDE 10 MG/ML IJ SOLN
40.0000 mg | Freq: Once | INTRAMUSCULAR | Status: AC
  Administered 2021-11-07: 40 mg via INTRAVENOUS
  Filled 2021-11-07: qty 4

## 2021-11-07 MED ORDER — PERFLUTREN LIPID MICROSPHERE
1.0000 mL | INTRAVENOUS | Status: AC | PRN
  Administered 2021-11-07: 4 mL via INTRAVENOUS

## 2021-11-07 MED ORDER — STROKE: EARLY STAGES OF RECOVERY BOOK
Freq: Once | Status: AC
  Filled 2021-11-07: qty 1

## 2021-11-07 MED ORDER — ACETAMINOPHEN 325 MG PO TABS
650.0000 mg | ORAL_TABLET | Freq: Four times a day (QID) | ORAL | Status: DC | PRN
  Administered 2021-11-08: 650 mg via ORAL
  Filled 2021-11-07: qty 2

## 2021-11-07 MED ORDER — ONDANSETRON HCL 4 MG PO TABS: 4.0000 mg | ORAL_TABLET | Freq: Four times a day (QID) | ORAL | Status: DC | PRN

## 2021-11-07 MED ORDER — IOHEXOL 300 MG/ML  SOLN
100.0000 mL | Freq: Once | INTRAMUSCULAR | Status: AC | PRN
  Administered 2021-11-07: 100 mL via INTRAVENOUS

## 2021-11-07 MED ORDER — CARVEDILOL 3.125 MG PO TABS
3.1250 mg | ORAL_TABLET | Freq: Two times a day (BID) | ORAL | Status: DC
  Administered 2021-11-07 – 2021-11-13 (×12): 3.125 mg via ORAL
  Filled 2021-11-07 (×12): qty 1

## 2021-11-07 MED ORDER — INSULIN ASPART 100 UNIT/ML IJ SOLN
0.0000 [IU] | Freq: Three times a day (TID) | INTRAMUSCULAR | Status: DC
  Administered 2021-11-07: 2 [IU] via SUBCUTANEOUS
  Administered 2021-11-07: 5 [IU] via SUBCUTANEOUS
  Administered 2021-11-08 – 2021-11-11 (×9): 3 [IU] via SUBCUTANEOUS
  Administered 2021-11-11: 2 [IU] via SUBCUTANEOUS
  Administered 2021-11-12: 3 [IU] via SUBCUTANEOUS
  Administered 2021-11-12: 11 [IU] via SUBCUTANEOUS
  Administered 2021-11-13: 8 [IU] via SUBCUTANEOUS
  Administered 2021-11-13: 5 [IU] via SUBCUTANEOUS
  Filled 2021-11-07 (×2): qty 1

## 2021-11-07 MED ORDER — HEPARIN SODIUM (PORCINE) 5000 UNIT/ML IJ SOLN
5000.0000 [IU] | Freq: Three times a day (TID) | INTRAMUSCULAR | Status: DC
  Administered 2021-11-07 (×2): 5000 [IU] via SUBCUTANEOUS
  Filled 2021-11-07 (×2): qty 1

## 2021-11-07 MED ORDER — ENSURE ENLIVE PO LIQD
237.0000 mL | Freq: Two times a day (BID) | ORAL | Status: DC
  Administered 2021-11-08 – 2021-11-10 (×6): 237 mL via ORAL
  Filled 2021-11-07 (×4): qty 237

## 2021-11-07 MED ORDER — NICOTINE 21 MG/24HR TD PT24
21.0000 mg | MEDICATED_PATCH | Freq: Every day | TRANSDERMAL | Status: DC
  Administered 2021-11-08 – 2021-11-11 (×4): 21 mg via TRANSDERMAL
  Filled 2021-11-07 (×6): qty 1

## 2021-11-07 MED ORDER — OXYCODONE HCL 5 MG PO TABS
5.0000 mg | ORAL_TABLET | ORAL | Status: DC | PRN
  Administered 2021-11-07 – 2021-11-12 (×7): 5 mg via ORAL
  Filled 2021-11-07 (×7): qty 1

## 2021-11-07 MED ORDER — ACETAMINOPHEN 650 MG RE SUPP: 650.0000 mg | Freq: Four times a day (QID) | RECTAL | Status: DC | PRN

## 2021-11-07 MED ORDER — INSULIN ASPART 100 UNIT/ML IJ SOLN
0.0000 [IU] | Freq: Every day | INTRAMUSCULAR | Status: DC
  Administered 2021-11-08: 2 [IU] via SUBCUTANEOUS
  Administered 2021-11-12: 3 [IU] via SUBCUTANEOUS

## 2021-11-07 MED ORDER — ONDANSETRON HCL 4 MG/2ML IJ SOLN: 4.0000 mg | Freq: Four times a day (QID) | INTRAMUSCULAR | Status: DC | PRN

## 2021-11-07 MED ORDER — FUROSEMIDE 10 MG/ML IJ SOLN
40.0000 mg | Freq: Two times a day (BID) | INTRAMUSCULAR | Status: DC
  Administered 2021-11-07 – 2021-11-10 (×6): 40 mg via INTRAVENOUS
  Filled 2021-11-07 (×6): qty 4

## 2021-11-07 NOTE — H&P (Signed)
History and Physical    Patient: [name]:[id number] DOB: Mar 10, 1955 DOA: 11/07/2021 DOS: the patient was seen and examined on 11/07/2021 PCP: Carrolyn Meiers, MD  Patient coming from: Home  Chief Complaint:  Chief Complaint  Patient presents with   Back Pain   HPI: [name] is a 66 y.o. male with medical history significant of Coronary artery disease, chronic lower back pain, CKD, cocaine abuse, diabetes mellitus type 2, ischemic cardiomyopathy, sleep apnea, and more presents the ED with a chief complaint of hurting in his back and pain and swelling of his testicles.  Patient is quite inappropriate.  He has a friend at bedside who helps take care of him.  When asking questions like what makes her pain better he tells both the friend at bedside and myself that we should rub his testicles to make them better.  He makes several comments of this nature.  All lead he says he has been hurting in his back and testicles for the last few days.  Friend at bedside has been trying to convince him to come into the ED but he would not until today.  He reports that he came in today because the pain is worse.  He describes it as a pressure and sharp.  Patient reports that women not listening to him as what makes his pain worse.  Of already discussed what makes his pain better per his report.  Friend at bedside said that he has been lifting heavy items, and he reports that that makes the pain worse as well.  He has had chest pain 3 or 4 days ago.  He was laying down watching TV when it happened.  He was laying flat, but reports that sitting up did not make the pain any better.  Exertion did not make the pain any worse.  It resolved spontaneously.  Patient reports that sometimes these pains feel like muscle pains but sometimes they do not.  He has had increased shortness of breath.  This been going on for 2 or 3 days.  He had a worsening cough that is sometimes productive of sputum streaked  with blood.  The sputum itself is white and thick.  He has not had any fever.  He reports he has had leg swelling over the past few days.  His left leg usually swells worse than his right.  Patient also reports that he fell yesterday.  He did hit his head.  He did not lose consciousness.  He reports that he was walking along and his legs just buckled underneath him.  He did not notice asymmetric weakness.  He did not have any numbness.  Friend at bedside reports that his speech has been slurred.  She is unsure how long its been going on as it was gradual in onset per her report.  Patient has no other complaints at this time.  Patient smokes.  He does not drink alcohol.  He does not use illicit drugs anymore per his report.  He is vaccinated for COVID.  He is full code. Review of Systems: As mentioned in the history of present illness. All other systems reviewed and are negative. Past Medical History:  Diagnosis Date   Bulging lumbar disc    CAD (coronary artery disease) [phone]   a. prior LAD stenting. b. s/p DES to [facility] 08/2015. c. 04/2016 Cardiac cath at [facility] Patent stent in the PLAD and RCA. Diffuse dLAD, OM2, and  RPDA disease. d.  DES to PDA and distal RCA 03/2019    Chronic lower back pain    CKD (chronic kidney disease), stage II    Cocaine abuse ([place])    DM2 (diabetes mellitus, type 2) (HCC)    Essential hypertension    GERD (gastroesophageal reflux disease)    Headache    History of pneumonia    Hyperlipidemia    Ischemic cardiomyopathy    LV (left ventricular) mural thrombus    Sleep apnea    Past Surgical History:  Procedure Laterality Date   APPENDECTOMY     CARDIAC CATHETERIZATION N/A 09/07/2015   Procedure: Left Heart Cath and Coronary Angiography;  Surgeon: Leonie Man, MD;  Location: [place] [facility];  Service: Cardiovascular;  Laterality: N/A;   CARDIAC CATHETERIZATION N/A 09/07/2015   Procedure: Coronary Stent Intervention;  Surgeon: Leonie Man, MD;   Location: [place] [facility];  Service: Cardiovascular;  Laterality: N/A;   CORONARY ANGIOGRAM  09/07/13   residual RCA and OM disease   CORONARY ANGIOPLASTY WITH STENT PLACEMENT     CORONARY STENT INTERVENTION N/A 10/16/2018   Procedure: CORONARY STENT INTERVENTION;  Surgeon: Burnell Blanks, MD;  Location: [place] [facility];  Service: Cardiovascular;  Laterality: N/A;   CORONARY STENT INTERVENTION N/A 03/11/2019   Procedure: CORONARY STENT INTERVENTION;  Surgeon: [name], MD;  Location: [place] [facility];  Service: Cardiovascular;  Laterality: N/A;   FRACTURE SURGERY     INCISION AND DRAINAGE OF WOUND Left 05/19/2019   Procedure: DEBRIDEMENT LEFT GROIN;  Surgeon: Georganna Skeans, MD;  Location: [place];  Service: General;  Laterality: Left;   INSERTION OF ILIAC STENT Left 11/30/2017   Left external illiac stent   INSERTION OF ILIAC STENT  11/30/2017   Procedure: Insertion Of Iliac Stent;  Surgeon: Lorretta Harp, MD;  Location: [place] [facility];  Service: Cardiovascular;;  Left external illiac stent   KNEE ARTHROSCOPY Left    KNEE SURGERY     "ligaments, cartilage; tendon, put a pin in" (11/30/2017)   LEFT HEART CATH Bilateral 07/08/2012   Procedure: LEFT HEART CATH;  Surgeon: [name], MD;  Location: [facility] CATH LAB;  Service: Cardiovascular;  Laterality: Bilateral;   LEFT HEART CATH AND CORONARY ANGIOGRAPHY N/A 10/16/2018   Procedure: LEFT HEART CATH AND CORONARY ANGIOGRAPHY;  Surgeon: Burnell Blanks, MD;  Location: [place] [facility];  Service: Cardiovascular;  Laterality: N/A;   LEFT HEART CATH AND CORONARY ANGIOGRAPHY N/A 03/11/2019   Procedure: LEFT HEART CATH AND CORONARY ANGIOGRAPHY;  Surgeon: [name], MD;  Location: [place] [facility];  Service: Cardiovascular;  Laterality: N/A;   LEFT HEART CATHETERIZATION WITH CORONARY ANGIOGRAM N/A 09/06/2013   STEMI, 2nd ISR LAD. Procedure: LEFT HEART CATHETERIZATION WITH CORONARY ANGIOGRAM;   Surgeon: [name], MD;  Location: [facility] CATH LAB;  Service: Cardiovascular;  Laterality: N/A;   LOWER EXTREMITY ANGIOGRAPHY N/A 11/30/2017   Procedure: LOWER EXTREMITY ANGIOGRAPHY;  Surgeon: Lorretta Harp, MD;  Location: [place] [facility];  Service: Cardiovascular;  Laterality: N/A;   PERCUTANEOUS CORONARY STENT INTERVENTION (PCI-S)  07/08/2012   Procedure: PERCUTANEOUS CORONARY STENT INTERVENTION (PCI-S);  Surgeon: [name], MD;  Location: [facility] CATH LAB;  Service: Cardiovascular;;  DES LAD   PERCUTANEOUS CORONARY STENT INTERVENTION (PCI-S) N/A 09/06/2013   Procedure: PERCUTANEOUS CORONARY STENT INTERVENTION (PCI-S);  Surgeon: [name], MD;  Location: [facility] CATH LAB;  Service: Cardiovascular;  Laterality: N/A;  Mid LAD 3.0/24mm  Promus   WOUND DEBRIDEMENT Left 05/20/2019   Procedure: DEBRIDEMENT GROIN;  Surgeon: Kinsinger, Arta Bruce, MD;  Location: [place];  Service: General;  Laterality: Left;   WRIST FRACTURE SURGERY Bilateral    Social History:  reports that he has been smoking cigarettes. He has a 24.00 pack-year smoking history. He has never used smokeless tobacco. He reports that he does not currently use alcohol. He reports that he does not use drugs.  Allergies  Allergen Reactions   Clopidogrel     Other reaction(s): Drowsy, Skin irritation    Family History  Problem Relation Age of Onset   Hypertension Mother    Diabetes Mother     Prior to Admission medications   Medication Sig Start Date End Date Taking? Authorizing Provider  gabapentin (NEURONTIN) 300 MG capsule Take 1 capsule (300 mg total) by mouth 3 (three) times daily. 08/06/21 09/05/21  Tonye Pearson, PA-C  sildenafil (VIAGRA) 50 MG tablet Take 1 tablet (50 mg total) by mouth daily as needed for erectile dysfunction. 08/13/21   O'NealCassie Freer, MD  traMADol (ULTRAM) 50 MG tablet Take 1 tablet (50 mg total) by mouth every 6 (six) hours as needed. 08/18/21   Primus Bravo., MD     Physical Exam: Vitals:   11/07/21 0253 11/07/21 0442 11/07/21 0445 11/07/21 0500  BP: (!) 160/110  (!) 120/90 (!) 134/97  Pulse: 84  71   Resp: 20  (!) 22 (!) 23  Temp:  97.9 F (36.6 C)    TempSrc:  Oral    SpO2: 95%  95%   Weight:      Height:       1.  General: Patient lying supine in bed, chronically ill-appearing, visibly short of breath   2. Psychiatric: Alert and oriented x 3, mood and behavior normal for situation, pleasant and cooperative with exam   3. Neurologic: Speech is slurred and language is normal, face is symmetric, moves all 4 extremities voluntarily, sensation is intact   4. HEENMT:  Head is atraumatic, normocephalic, pupils reactive to light, neck is supple, trachea is midline, mucous membranes are moist   5. Respiratory : Minimal wheeze heard in the left lower lung field, a lot of upper airway noise as well, auto PEEP breathing, maintaining oxygen sats on 3 L nasal cannula, speaking in 3-4 word phrases   6. Cardiovascular : Heart rate normal, rhythm is regular, no murmurs, rubs or gallops, peripheral edema present, peripheral pulses palpated   7. Gastrointestinal:  Abdomen is soft, nondistended, nontender to palpation bowel sounds active, no masses or organomegaly palpated   8. Skin:  Skin is warm, dry and intact without rashes, acute lesions, or ulcers on limited exam   9.Musculoskeletal:  No acute deformities or trauma, no asymmetry in tone, peripheral edema present, peripheral pulses palpated, no tenderness to palpation in the extremities  Data Reviewed: In the ED Temp 97.9-98.2, heart rate 71-93, respiratory rate 20-23, blood pressure 120/76-160/110, satting 85-100% on 3 L nasal cannula Minimal leukocytosis 11.0, hemoglobin 11.8 Chemistry shows an elevated creatinine at 1.57 very close to his baseline, and a hyperglycemia 208 Alk phos is 30 Albumin 2.7 BNP 1157 Troponin 31 Blood cultures pending CT C-spine and head is positive for  right temporal lobe hypodensity EKG shows a heart rate of 86, sinus rhythm, QTc 555 Admission requested for acute respiratory failure with hypoxia and stroke work-up  Assessment and Plan: * Acute respiratory failure with hypoxia ([place]) - Most likely secondary to  CHF - See plan for CHF exacerbation  Protein calorie malnutrition (HCC) - Albumin 2.7 - Encourage nutrient dense food choices  Acute metabolic encephalopathy - Described as hallucinations - CT head shows a hypodensity in the right temporal lobe - MRI brain ordered - Patient has auto PEEP breathing, check a VBG - Check TSH, M35, B1, folic acid - Patient has a history of cocaine use reports last time 4 years ago, check a UDS - Consult neurology - Neurochecks - Monitor on telemetry   Elevated troponin - Troponin 31 - In the setting of CHF - Likely demand ischemia - Trend troponin - EKG shows a heart rate of 86, sinus rhythm, QTc 555 without any acute ischemic changes - Continue to monitor  Stroke (cerebrum) (HCC) - Hypodensity in the left temporal lobe - MRI in the a.m. - Echo - Neurochecks - PT/OT/ST eval and treat - Consult neurology  Uncontrolled type 2 diabetes mellitus with hyperglycemia (HCC) - Glucose 208 - Sliding scale coverage - Update hemoglobin A1c - Last A1c was 9.9 - Continue to monitor  Acute renal failure superimposed on stage 3b chronic kidney disease (HCC) - Creatinine is elevated at 1.57 - Very close to patient's baseline - Continue to monitor  Claudication in peripheral vascular disease [facility]) - Patient had vascular surgery last year per his report - CT scan shows occlusion of the left common internal and proximal iliac artery.  There is reconstitution of the external on the CTA - Consult vascular surgery - When med rec is complete, add back home medications - Continue to monitor  Acute on chronic systolic CHF (congestive heart failure) (HCC) - Oxygen saturations down to 85% -  Corrected on 3 L nasal cannula - Slight leukocytosis at 11.0 - CT chest abdomen pelvis shows mild to moderate pulmonary edema and cardiomegaly - Patient has a history of systolic heart failure - Last echo showed an ejection fraction of 20-25% in 2022 - Update echo - Start beta-blocker - Start diuresis with 40 mg IV Lasix twice daily - Monitor intake and output - Daily weights - Fluid restrictions - Patient meets criteria with cardiomegaly, pulmonary edema, dyspnea on exertion, peripheral edema - Continue to monitor  Tobacco use disorder - Patient is a current smoker - Nicotine patch ordered - Counseled patient on the importance of cessation but he has no interest in quitting at this time      Advance Care Planning:   Code Status: Prior full  Consults: Neurology, vascular surgery  Family Communication: Friend at bedside is going to get in touch with daughter in [place]  Severity of Illness: The appropriate patient status for this patient is INPATIENT. Inpatient status is judged to be reasonable and necessary in order to provide the required intensity of service to ensure the patient's safety. The patient's presenting symptoms, physical exam findings, and initial radiographic and laboratory data in the context of their chronic comorbidities is felt to place them at high risk for further clinical deterioration. Furthermore, it is not anticipated that the patient will be medically stable for discharge from the hospital within 2 midnights of admission.   * I certify that at the point of admission it is my clinical judgment that the patient will require inpatient hospital care spanning beyond 2 midnights from the point of admission due to high intensity of service, high risk for further deterioration and high frequency of surveillance required.*  Author: Rolla Plate, DO 11/07/2021 6:23 AM  For on call review [email or web address].

## 2021-11-07 NOTE — ED Notes (Signed)
Pt refusing to keep EKG monitoring leads on.

## 2021-11-07 NOTE — ED Notes (Signed)
Lunch tray given to pt.

## 2021-11-07 NOTE — Assessment & Plan Note (Addendum)
Patient's volume has improved, at the time of his discharge his renal function has a serum cr of 1,91 with K at 4,8 and serum bicarbonate at 29. His base serum cr has been 1,5 to 1,7.   Plan to continue diuresis with oral furosemide.  Follow up renal function as outpatient.

## 2021-11-07 NOTE — Assessment & Plan Note (Deleted)
-   Oxygen saturations down to 85% - Corrected on 3 L nasal cannula - Slight leukocytosis at 11.0 - CT chest abdomen pelvis shows mild to moderate pulmonary edema and cardiomegaly - Patient has a history of systolic heart failure - Last echo showed an ejection fraction of 20-25% in 2022 - Update echo - Start beta-blocker - Start diuresis with 40 mg IV Lasix twice daily - Monitor intake and output - Daily weights - Fluid restrictions - Patient meets criteria with cardiomegaly, pulmonary edema, dyspnea on exertion, peripheral edema - Continue to monitor

## 2021-11-07 NOTE — ED Notes (Signed)
Breakfast tray given to pt 

## 2021-11-07 NOTE — Assessment & Plan Note (Addendum)
Hyperglycemia.  His fasting glucose at the time of discharge 243. At the time of his discharge patient will resume JANUMET and have close follow up as outpatient.  He has been not compliant with his medications at home.

## 2021-11-07 NOTE — Assessment & Plan Note (Addendum)
Multifactorial encephalopathy clinically has improved Patient with no clinical signs of viral encephalitis  Antiviral therapy has been discontinued Patient has been evaluated by physical therapy with recommendations for home health services.

## 2021-11-07 NOTE — Assessment & Plan Note (Signed)
-   Most likely secondary to CHF - See plan for CHF exacerbation

## 2021-11-07 NOTE — Assessment & Plan Note (Signed)
-   Patient is a current smoker - Nicotine patch ordered - Counseled patient on the importance of cessation but he has no interest in quitting at this time

## 2021-11-07 NOTE — Progress Notes (Signed)
*  PRELIMINARY RESULTS* Echocardiogram 2D Echocardiogram has been performed with Definity.  Samuel Germany 11/07/2021, 10:47 AM

## 2021-11-07 NOTE — ED Notes (Signed)
Patient transported to CT 

## 2021-11-07 NOTE — Assessment & Plan Note (Signed)
Patient ruled out for acute CVA Continue blood pressure monitoring

## 2021-11-07 NOTE — Assessment & Plan Note (Signed)
-   Patient had vascular surgery last year per his report - CT scan shows occlusion of the left common internal and proximal iliac artery.  There is reconstitution of the external on the CTA - Consult vascular surgery - When med rec is complete, add back home medications - Continue to monitor

## 2021-11-07 NOTE — ED Notes (Signed)
[facility] present to provide transport to [facility] at this time.

## 2021-11-07 NOTE — ED Provider Notes (Signed)
[facility] EMERGENCY DEPARTMENT Provider Note   CSN: [id number] Arrival date & time: 11/06/21  2331     History  Chief Complaint  Patient presents with   Back Pain    [name] is a 66 y.o. male.  The history is provided by the patient and the spouse. The history is limited by the condition of the patient (Altered mental status).  Back Pain He has history of hypertension, diabetes, hyperlipidemia, coronary artery disease, chronic kidney disease and has been having problems with confusion over the last week.  He has been hallucinating at home.  This evening, he fell several times.  There has been no known fever or chills.  Spouse has noted some leg swelling.  There has been no vomiting or diarrhea.  There has been no cough.   Home Medications Prior to Admission medications   Medication Sig Start Date End Date Taking? Authorizing Provider  gabapentin (NEURONTIN) 300 MG capsule Take 1 capsule (300 mg total) by mouth 3 (three) times daily. 08/06/21 09/05/21  Tonye Pearson, PA-C  sildenafil (VIAGRA) 50 MG tablet Take 1 tablet (50 mg total) by mouth daily as needed for erectile dysfunction. 08/13/21   O'NealCassie Freer, MD  traMADol (ULTRAM) 50 MG tablet Take 1 tablet (50 mg total) by mouth every 6 (six) hours as needed. 08/18/21   Stoneking, Reece Leader., MD      Allergies    Clopidogrel    Review of Systems   Review of Systems  Unable to perform ROS: Mental status change  Musculoskeletal:  Positive for back pain.    Physical Exam Updated Vital Signs BP (!) 143/98   Pulse 80   Temp 98.2 F (36.8 C) (Oral)   Resp 20   Ht '5\' 9"'$  (1.753 m)   Wt 102 kg   SpO2 99%   BMI 33.21 kg/m  Physical Exam Vitals and nursing note reviewed.   66 year old male, resting comfortably and in no acute distress. Vital signs are significant for elevated blood pressure. Oxygen saturation is 85%, which is hypoxic, improved to 99% with supplemental oxygen. Head is normocephalic and  atraumatic. PERRLA, EOMI. Oropharynx is clear. Neck is mildly tender in the midline. Back is mildly tender throughout the thoracic and lumbar spine and also in the soft tissues to the left of the lumbar spine.  There is no CVA tenderness. Lungs are clear without rales, wheezes, or rhonchi. Chest is nontender. Heart has regular rate and rhythm without murmur. Abdomen is soft, flat, with mild left lower quadrant tenderness.  There is no rebound or guarding. Extremities have 1+ edema, full range of motion is present. Skin is warm and dry without rash. Neurologic: Sleepy but arousable, oriented to person and place but not time, cranial nerves are intact, moves all extremities equally.  ED Results / Procedures / Treatments   Labs (all labs ordered are listed, but only abnormal results are displayed) Labs Reviewed  CBC - Abnormal; Notable for the following components:      Result Value   WBC 11.0 (*)    Hemoglobin 11.8 (*)    HCT 38.6 (*)    MCH 24.9 (*)    RDW 21.5 (*)    All other components within normal limits  CULTURE, BLOOD (ROUTINE X 2)  CULTURE, BLOOD (ROUTINE X 2)  COMPREHENSIVE METABOLIC PANEL  LIPASE, BLOOD  URINALYSIS, ROUTINE W REFLEX MICROSCOPIC    EKG None  Radiology DG Chest Port 1 View  Result Date:  11/07/2021 CLINICAL DATA:  Shortness of breath. EXAM: PORTABLE CHEST 1 VIEW COMPARISON:  Chest radiograph dated 05/14/2019. FINDINGS: Cardiomegaly with vascular congestion and edema. Small right pleural effusion and right lung base atelectasis. Pneumonia is not excluded. No pneumothorax. No acute osseous pathology. Degenerative changes of the spine. IMPRESSION: 1. Cardiomegaly with vascular congestion and edema. 2. Small right pleural effusion. Electronically Signed   By: Anner Crete M.D.   On: 11/07/2021 01:22    Procedures Procedures  Cardiac monitor shows normal sinus rhythm, per my interpretation.  Medications Ordered in ED Medications - No data to  display  ED Course/ Medical Decision Making/ A&P                           Medical Decision Making Amount and/or Complexity of Data Reviewed Labs: ordered. Radiology: ordered.  Risk Prescription drug management. Decision regarding hospitalization.   Fall at home without obvious injury.  Altered mental status, consider occult infection, drug toxicity, occult stroke.  Hypoxia-consider pneumonia, COPD, heart failure exacerbation.  Chest x-ray shows cardiomegaly and vascular congestion consistent with heart failure.  I have independently viewed the image, and agree with radiologist's interpretation.  Because of altered mentation and history of trauma, I have ordered trauma scans.  This includes CT of head, cervical spine, chest, abdomen, pelvis.  I have reviewed and interpreted the laboratory tests which are back at this time, and my interpretation is mild leukocytosis which is nonspecific, mild anemia with hemoglobin decreased compared with 12/11/2020, but in the similar range that it had been in all tests prior to that.  I have reviewed his past records, and on 08/31/2020, he had an echocardiogram showing left ventricular ejection fraction of 20-25% with indeterminate diastolic parameters.  I have reviewed and interpreted his other laboratory tests, and my interpretation is mild renal insufficiency which is not significantly changed from baseline, mild elevation of troponin which is felt to be demand ischemia, marked elevation of BNP consistent with acute heart failure.  CT of head shows hypodensity in the right temporal lobe which is new concerning for subacute infarct.  CT of cervical spine shows no acute injury.  CT of chest, abdomen, pelvis shows no acute injury, but mediastinal adenopathy is present concerning for a lymphoproliferative process, cardiomegaly with multivessel coronary artery stenting noted, peripheral vascular disease with occlusion of left common internal and proximal external iliac  arteries with distal reconstitution, 3.3 cm fusiform right common iliac artery aneurysm.  I have independently viewed all of these images, and agree with the radiologist's interpretation.  I have ordered a dose of furosemide.  It is possible that he has a temporal lobe stroke accounting for his altered mentation over the last week.  However, he has no focal neurologic findings and clearly would be outside of the timeframe for intervention.  MRI is not available today, I feel he can safely wait until tomorrow for an MRI to evaluate for possible new stroke.  In the meantime, he needs to be admitted for management of his heart failure.  His exam shows no evidence of active infection, antibiotics not indicated.  Case is discussed with Dr. Clearence Ped of [facility], who agrees to admit the patient.  CRITICAL CARE Performed by: Delora Fuel Total critical care time: 50 minutes Critical care time was exclusive of separately billable procedures and treating other patients. Critical care was necessary to treat or prevent imminent or life-threatening deterioration. Critical care was time spent personally by  me on the following activities: development of treatment plan with patient and/or surrogate as well as nursing, discussions with consultants, evaluation of patient's response to treatment, examination of patient, obtaining history from patient or surrogate, ordering and performing treatments and interventions, ordering and review of laboratory studies, ordering and review of radiographic studies, pulse oximetry and re-evaluation of patient's condition.  Final Clinical Impression(s) / ED Diagnoses Final diagnoses:  Acute systolic heart failure ([place])  Confusion  Renal insufficiency  Normocytic anemia    Rx / DC Orders ED Discharge Orders     None      99% with supplemental oxygen   Delora Fuel, MD 28/97/91 [phone]

## 2021-11-07 NOTE — Assessment & Plan Note (Signed)
-   Albumin 2.7 - Encourage nutrient dense food choices

## 2021-11-07 NOTE — TOC Progression Note (Addendum)
  Transition of Care [facility]) Screening Note   Patient Details  Name: [name] Date of Birth: 04-Apr-1955   Transition of Care [facility]) CM/SW Contact:    Boneta Lucks, RN Phone Number: 11/07/2021, 9:56 AM  Waiting on Transfer to [facility] notification done - [phone]  Transition of Care Department [facility]) has reviewed patient and no TOC needs have been identified at this time. We will continue to monitor patient advancement through interdisciplinary progression rounds. If new patient transition needs arise, please place a TOC consult.

## 2021-11-07 NOTE — Progress Notes (Signed)
ASSUMPTION OF CARE NOTE   11/07/2021 12:20 PM  [name] was seen and examined.  The H&P by the admitting provider, orders, imaging was reviewed.  Please see new orders.  Will continue to follow.   Pt presents with concern for acute CVA with abnormal CT scan.  He needs an MRI.  In addition he is massively volume overloaded.  After reviewing his records it appears that he has severe cardiomyopathy with EF 20-25%.  He had refused on multiple occasions to take any of his cardiac medications.  Has physical exam findings of anasarca, cardiac asthma, massive volume overload.  He is still smoking.  Awaiting transfer to [facility] for MRI brain.  If stroke positive will need a neurology consultation.  He could also benefit from a palliative medicine consultation. Discussed with Dr. Reesa Chew who will assume care of patient when he arrives at [facility].    Vitals:   11/07/21 0834 11/07/21 1032  BP:  (!) 156/88  Pulse:    Resp:    Temp: (!) 97.4 F (36.3 C)   SpO2:      Results for orders placed or performed during the hospital encounter of 11/07/21  Culture, blood (Routine X 2) w Reflex to ID Panel   Specimen: Site Not Specified; Blood  Result Value Ref Range   Specimen Description      SITE NOT SPECIFIED BOTTLES DRAWN AEROBIC AND ANAEROBIC   Special Requests      Blood Culture adequate volume Performed at [facility], [address] [place], [place] [address]    Culture PENDING    Report Status PENDING   Culture, blood (Routine X 2) w Reflex to ID Panel   Specimen: BLOOD RIGHT FOREARM  Result Value Ref Range   Specimen Description      BLOOD RIGHT FOREARM BOTTLES DRAWN AEROBIC AND ANAEROBIC   Special Requests      Blood Culture adequate volume Performed at [facility], [address] [place], [place] [address]    Culture PENDING    Report Status PENDING   Comprehensive metabolic panel  Result Value Ref Range   Sodium 139 135 - 145 mmol/L   Potassium 3.7 3.5 - 5.1 mmol/L   Chloride  107 98 - 111 mmol/L   CO2 25 22 - 32 mmol/L   Glucose, Bld 208 (H) 70 - 99 mg/dL   BUN 18 8 - 23 mg/dL   Creatinine, Ser 1.57 (H) 0.61 - 1.24 mg/dL   Calcium 8.1 (L) 8.9 - 10.3 mg/dL   Total Protein 7.5 6.5 - 8.1 g/dL   Albumin 2.7 (L) 3.5 - 5.0 g/dL   AST 25 15 - 41 U/L   ALT 24 0 - 44 U/L   Alkaline Phosphatase 30 (L) 38 - 126 U/L   Total Bilirubin 0.0 (L) 0.3 - 1.2 mg/dL   GFR, Estimated 48 (L) >60 mL/min   Anion gap 7 5 - 15  CBC  Result Value Ref Range   WBC 11.0 (H) 4.0 - 10.5 K/uL   RBC 4.74 4.22 - 5.81 MIL/uL   Hemoglobin 11.8 (L) 13.0 - 17.0 g/dL   HCT 38.6 (L) 39.0 - 52.0 %   MCV 81.4 80.0 - 100.0 fL   MCH 24.9 (L) 26.0 - 34.0 pg   MCHC 30.6 30.0 - 36.0 g/dL   RDW 21.5 (H) 11.5 - 15.5 %   Platelets 265 150 - 400 K/uL   nRBC 0.2 0.0 - 0.2 %  Lipase, blood  Result  Value Ref Range   Lipase 24 11 - 51 U/L  Urinalysis, Routine w reflex microscopic Urine, Clean Catch  Result Value Ref Range   Color, Urine AMBER (A) YELLOW   APPearance HAZY (A) CLEAR   Specific Gravity, Urine 1.027 1.005 - 1.030   pH 5.0 5.0 - 8.0   Glucose, UA 50 (A) NEGATIVE mg/dL   Hgb urine dipstick NEGATIVE NEGATIVE   Bilirubin Urine NEGATIVE NEGATIVE   Ketones, ur NEGATIVE NEGATIVE mg/dL   Protein, ur >=300 (A) NEGATIVE mg/dL   Nitrite NEGATIVE NEGATIVE   Leukocytes,Ua NEGATIVE NEGATIVE   RBC / HPF 0-5 0 - 5 RBC/hpf   WBC, UA 0-5 0 - 5 WBC/hpf   Bacteria, UA NONE SEEN NONE SEEN   Mucus PRESENT    Hyaline Casts, UA PRESENT   Brain natriuretic peptide  Result Value Ref Range   B Natriuretic Peptide 1,157.0 (H) 0.0 - 100.0 pg/mL  TSH  Result Value Ref Range   TSH 2.489 0.350 - 4.500 uIU/mL  Vitamin B12  Result Value Ref Range   Vitamin B-12 793 180 - 914 pg/mL  Folate  Result Value Ref Range   Folate 6.5 >5.9 ng/mL  Blood gas, venous  Result Value Ref Range   pH, Ven 7.37 7.25 - 7.43   pCO2, Ven 59 44 - 60 mmHg   pO2, Ven <31 (LL) 32 - 45 mmHg   Bicarbonate 34.5 (H) 20.0 - 28.0  mmol/L   Acid-Base Excess 7.0 (H) 0.0 - 2.0 mmol/L   O2 Saturation 16.8 %   Patient temperature 36.4    Collection site LEFT ANTECUBITAL    Drawn by [id number]   CBG monitoring, ED  Result Value Ref Range   Glucose-Capillary 225 (H) 70 - 99 mg/dL  ECHOCARDIOGRAM COMPLETE  Result Value Ref Range   Weight 3,597.91 oz   Height 69 in   BP 119/94 mmHg  Troponin I (High Sensitivity)  Result Value Ref Range   Troponin I (High Sensitivity) 31 (H) <18 ng/L  Troponin I (High Sensitivity)  Result Value Ref Range   Troponin I (High Sensitivity) 38 (H) <18 ng/L     C. Wynetta Emery, MD [facility]   11/07/2021 12:32 AM How to contact the [facility] Attending or Consulting provider [place] or covering provider during after hours [place], for this patient?  Check the care team in [facility] and look for a) attending/consulting TRH provider listed and b) the [facility] team listed Log into [email or web address] and use [place]'s universal password to access. If you do not have the password, please contact the hospital operator. Locate the [facility] provider you are looking for under [facility] and page to a number that you can be directly reached. If you still have difficulty reaching the provider, please page the [facility] (Director on Call) for the Hospitalists listed on amion for assistance.

## 2021-11-07 NOTE — Assessment & Plan Note (Signed)
-   Troponin 31 - In the setting of CHF - Likely demand ischemia - Trend troponin - EKG shows a heart rate of 86, sinus rhythm, QTc 555 without any acute ischemic changes - Continue to monitor

## 2021-11-07 NOTE — ED Triage Notes (Signed)
Pt arrives POV c/o lower back pain that has increased in the past week. Pts wife concerned that he may have UTI. Pt noted to have increased work of breathing during triage. Pt found to be 83% on RA, placed pt on 3L [place] and sats increased to 92%.

## 2021-11-08 ENCOUNTER — Inpatient Hospital Stay (HOSPITAL_COMMUNITY): Payer: No Typology Code available for payment source

## 2021-11-08 DIAGNOSIS — G9341 Metabolic encephalopathy: Secondary | ICD-10-CM | POA: Diagnosis not present

## 2021-11-08 DIAGNOSIS — R4182 Altered mental status, unspecified: Secondary | ICD-10-CM

## 2021-11-08 DIAGNOSIS — J9601 Acute respiratory failure with hypoxia: Secondary | ICD-10-CM | POA: Diagnosis not present

## 2021-11-08 LAB — GLUCOSE, CAPILLARY
Glucose-Capillary: 177 mg/dL — ABNORMAL HIGH (ref 70–99)
Glucose-Capillary: 117 mg/dL — ABNORMAL HIGH (ref 70–99)
Glucose-Capillary: 104 mg/dL — ABNORMAL HIGH (ref 70–99)
Glucose-Capillary: 222 mg/dL — ABNORMAL HIGH (ref 70–99)

## 2021-11-08 LAB — URINE CULTURE: Culture: NO GROWTH

## 2021-11-08 LAB — COMPREHENSIVE METABOLIC PANEL
ALT: 24 U/L (ref 0–44)
AST: 25 U/L (ref 15–41)
Albumin: 2.7 g/dL — ABNORMAL LOW (ref 3.5–5.0)
Alkaline Phosphatase: 30 U/L — ABNORMAL LOW (ref 38–126)
Anion gap: 7 (ref 5–15)
BUN: 18 mg/dL (ref 8–23)
CO2: 25 mmol/L (ref 22–32)
Calcium: 8.1 mg/dL — ABNORMAL LOW (ref 8.9–10.3)
Chloride: 107 mmol/L (ref 98–111)
Creatinine, Ser: 1.57 mg/dL — ABNORMAL HIGH (ref 0.61–1.24)
GFR, Estimated: 48 mL/min — ABNORMAL LOW (ref >60)
Glucose, Bld: 208 mg/dL — ABNORMAL HIGH (ref 70–99)
Potassium: 3.7 mmol/L (ref 3.5–5.1)
Sodium: 139 mmol/L (ref 135–145)
Total Bilirubin: 0.6 mg/dL (ref 0.3–1.2)
Total Protein: 7.5 g/dL (ref 6.5–8.1)

## 2021-11-08 LAB — TROPONIN I (HIGH SENSITIVITY)
Troponin I (High Sensitivity): 31 ng/L — ABNORMAL HIGH (ref <18)
Troponin I (High Sensitivity): 32 ng/L — ABNORMAL HIGH (ref <18)

## 2021-11-08 LAB — AMMONIA: Ammonia: 19 umol/L (ref 9–35)

## 2021-11-08 MED ORDER — THIAMINE HCL 100 MG/ML IJ SOLN
100.0000 mg | Freq: Every day | INTRAMUSCULAR | Status: DC
  Administered 2021-11-08: 100 mg via INTRAVENOUS
  Filled 2021-11-08: qty 2

## 2021-11-08 MED ORDER — THIAMINE MONONITRATE 100 MG PO TABS
100.0000 mg | ORAL_TABLET | Freq: Every day | ORAL | Status: DC
  Administered 2021-11-09 – 2021-11-13 (×4): 100 mg via ORAL
  Filled 2021-11-08 (×5): qty 1

## 2021-11-08 MED ORDER — LORAZEPAM 2 MG/ML IJ SOLN
1.0000 mg | INTRAMUSCULAR | Status: DC | PRN
  Administered 2021-11-08: 1 mg via INTRAVENOUS
  Filled 2021-11-08: qty 1

## 2021-11-08 MED ORDER — DEXTROSE 5 % IV SOLN
1000.0000 mg | Freq: Once | INTRAVENOUS | Status: AC
  Administered 2021-11-08: 1000 mg via INTRAVENOUS
  Filled 2021-11-08: qty 20

## 2021-11-08 MED ORDER — LORAZEPAM 2 MG/ML IJ SOLN: 1.0000 mg | Freq: Once | INTRAMUSCULAR | Status: DC | PRN

## 2021-11-08 MED ORDER — SODIUM CHLORIDE 0.9 % IV SOLN: INTRAVENOUS | Status: DC

## 2021-11-08 MED ORDER — MORPHINE SULFATE (PF) 2 MG/ML IV SOLN
1.0000 mg | Freq: Once | INTRAVENOUS | Status: AC | PRN
  Administered 2021-11-08: 1 mg via INTRAVENOUS
  Filled 2021-11-08: qty 1

## 2021-11-08 MED ORDER — NALOXONE HCL 0.4 MG/ML IJ SOLN: 0.4000 mg | INTRAMUSCULAR | Status: DC | PRN

## 2021-11-08 MED ORDER — NITROGLYCERIN 0.4 MG SL SUBL
0.4000 mg | SUBLINGUAL_TABLET | SUBLINGUAL | Status: AC | PRN
  Administered 2021-11-08 (×2): 0.4 mg via SUBLINGUAL

## 2021-11-08 MED ORDER — GADOBUTROL 1 MMOL/ML IV SOLN
10.0000 mL | Freq: Once | INTRAVENOUS | Status: AC | PRN
  Administered 2021-11-08: 10 mL via INTRAVENOUS

## 2021-11-08 MED ORDER — DEXTROSE 5 % IV SOLN
900.0000 mg | Freq: Three times a day (TID) | INTRAVENOUS | Status: DC
  Administered 2021-11-08 – 2021-11-10 (×7): 900 mg via INTRAVENOUS
  Filled 2021-11-08 (×9): qty 18

## 2021-11-08 MED ORDER — NITROGLYCERIN 0.4 MG SL SUBL
SUBLINGUAL_TABLET | SUBLINGUAL | Status: AC
  Administered 2021-11-08: 0.4 mg via SUBLINGUAL
  Filled 2021-11-08: qty 3

## 2021-11-08 NOTE — Consult Note (Signed)
NEUROLOGY CONSULTATION NOTE   Date of service: November 08, 2021 Patient Name: [name] MRN:  [id number] DOB:  1955-04-14 Reason for consult: "inappropriate behavior, temporal lobe stroke on CT head" Requesting Provider: Shela Leff, MD. _ _ _   _ __   _ __ _ _  __ __   _ __   __ _  History of Present Illness  [name] is a 66 y.o. male with PMH significant for Coronary artery disease, chronic lower back pain, CKD, cocaine abuse, diabetes mellitus type 2, ischemic cardiomyopathy, sleep apnea, HLD, HTN who presents with back and genital pain. [name] also endorsed shortness of breath and chest pain and found to have cardiomyopathy with low EF. [name] was noted to behave inappropriately towards staff and prompted a CT head which was concerning for a R temporal lobe hypodensity. [name] was transferred to [facility] for an MRI since it is not available at [facility] over the weekend. MRI severely motion degraded but demonstrates pathology in the R temporal lobe which is hard to interpret given significant motion degradation and will need repeat MRI Brain.  Neurology consulted to evaluate him for an further recs.  On my evaluation, patient reports some chest pain which brought him in, on questioning him about the genital pain, does recall having genital pain when [name] presented to the hospital. [name] wants to sleep and refuses to talk to me further and wants me to leave him alone.  Spoke to bedside RN. Has been acting inappropriately towards multiple male staff members.  ROS   Unable to get detailed ROS 2/2 patient refusal to answer any questions.  Past History   Past Medical History:  Diagnosis Date   Bulging lumbar disc    CAD (coronary artery disease) [id number]   a. prior LAD stenting. b. s/p DES to [facility] 08/2015. c. 04/2016 Cardiac cath at [facility]. Patent stent in the PLAD and RCA. Diffuse dLAD, OM2, and  RPDA disease. d. DES to PDA and distal RCA 03/2019    Chronic lower back  pain    CKD (chronic kidney disease), stage II    Cocaine abuse ([place])    DM2 (diabetes mellitus, type 2) (HCC)    Essential hypertension    GERD (gastroesophageal reflux disease)    Headache    History of pneumonia    Hyperlipidemia    Ischemic cardiomyopathy    LV (left ventricular) mural thrombus    Sleep apnea    Past Surgical History:  Procedure Laterality Date   APPENDECTOMY     CARDIAC CATHETERIZATION N/A 09/07/2015   Procedure: Left Heart Cath and Coronary Angiography;  Surgeon: Leonie Man, MD;  Location: [place] CV LAB;  Service: Cardiovascular;  Laterality: N/A;   CARDIAC CATHETERIZATION N/A 09/07/2015   Procedure: Coronary Stent Intervention;  Surgeon: Leonie Man, MD;  Location: [place] CV LAB;  Service: Cardiovascular;  Laterality: N/A;   CORONARY ANGIOGRAM  09/07/13   residual RCA and OM disease   CORONARY ANGIOPLASTY WITH STENT PLACEMENT     CORONARY STENT INTERVENTION N/A 10/16/2018   Procedure: CORONARY STENT INTERVENTION;  Surgeon: Burnell Blanks, MD;  Location: [place] CV LAB;  Service: Cardiovascular;  Laterality: N/A;   CORONARY STENT INTERVENTION N/A 03/11/2019   Procedure: CORONARY STENT INTERVENTION;  Surgeon: [name], MD;  Location: [place] CV LAB;  Service: Cardiovascular;  Laterality: N/A;   FRACTURE SURGERY     INCISION AND DRAINAGE OF WOUND Left  05/19/2019   Procedure: DEBRIDEMENT LEFT GROIN;  Surgeon: Georganna Skeans, MD;  Location: [place];  Service: General;  Laterality: Left;   INSERTION OF ILIAC STENT Left 11/30/2017   Left external illiac stent   INSERTION OF ILIAC STENT  11/30/2017   Procedure: Insertion Of Iliac Stent;  Surgeon: Lorretta Harp, MD;  Location: [place] CV LAB;  Service: Cardiovascular;;  Left external illiac stent   KNEE ARTHROSCOPY Left    KNEE SURGERY     "ligaments, cartilage; tendon, put a pin in" (11/30/2017)   LEFT HEART CATH Bilateral 07/08/2012   Procedure: LEFT HEART CATH;   Surgeon: [name], MD;  Location: [facility] CATH LAB;  Service: Cardiovascular;  Laterality: Bilateral;   LEFT HEART CATH AND CORONARY ANGIOGRAPHY N/A 10/16/2018   Procedure: LEFT HEART CATH AND CORONARY ANGIOGRAPHY;  Surgeon: Burnell Blanks, MD;  Location: [place] CV LAB;  Service: Cardiovascular;  Laterality: N/A;   LEFT HEART CATH AND CORONARY ANGIOGRAPHY N/A 03/11/2019   Procedure: LEFT HEART CATH AND CORONARY ANGIOGRAPHY;  Surgeon: [name], MD;  Location: [place] CV LAB;  Service: Cardiovascular;  Laterality: N/A;   LEFT HEART CATHETERIZATION WITH CORONARY ANGIOGRAM N/A 09/06/2013   STEMI, 2nd ISR LAD. Procedure: LEFT HEART CATHETERIZATION WITH CORONARY ANGIOGRAM;  Surgeon: [name], MD;  Location: [facility] CATH LAB;  Service: Cardiovascular;  Laterality: N/A;   LOWER EXTREMITY ANGIOGRAPHY N/A 11/30/2017   Procedure: LOWER EXTREMITY ANGIOGRAPHY;  Surgeon: Lorretta Harp, MD;  Location: [place] CV LAB;  Service: Cardiovascular;  Laterality: N/A;   PERCUTANEOUS CORONARY STENT INTERVENTION (PCI-S)  07/08/2012   Procedure: PERCUTANEOUS CORONARY STENT INTERVENTION (PCI-S);  Surgeon: [name], MD;  Location: [facility] CATH LAB;  Service: Cardiovascular;;  DES LAD   PERCUTANEOUS CORONARY STENT INTERVENTION (PCI-S) N/A 09/06/2013   Procedure: PERCUTANEOUS CORONARY STENT INTERVENTION (PCI-S);  Surgeon: [name], MD;  Location: [facility] CATH LAB;  Service: Cardiovascular;  Laterality: N/A;  Mid LAD 3.0/24mm Promus   WOUND DEBRIDEMENT Left 05/20/2019   Procedure: DEBRIDEMENT GROIN;  Surgeon: Kinsinger, Arta Bruce, MD;  Location: [place];  Service: General;  Laterality: Left;   WRIST FRACTURE SURGERY Bilateral    Family History  Problem Relation Age of Onset   Hypertension Mother    Diabetes Mother    Social History   Socioeconomic History   Marital status: Divorced    Spouse name: Not on file   Number of children: 3   Years of education: Not on file    Highest education level: Not on file  Occupational History   Not on file  Tobacco Use   Smoking status: Every Day    Packs/day: 0.50    Years: 48.00    Total pack years: 24.00    Types: Cigarettes   Smokeless tobacco: Never  Vaping Use   Vaping Use: Never used  Substance and Sexual Activity   Alcohol use: Not Currently    Comment: 11/30/2017 "might drink a beer q 6 months"   Drug use: Never   Sexual activity: Not on file  Other Topics Concern   Not on file  Social History Narrative   ** Merged History Encounter **       Social Determinants of Health   Financial Resource Strain: Not on file  Food Insecurity: Not on file  Transportation Needs: Not on file  Physical Activity: Not on file  Stress: Not on file  Social Connections: Not on file   Allergies  Allergen  Reactions   Clopidogrel     Other reaction(s): Drowsy, Skin irritation    Medications   Medications Prior to Admission  Medication Sig Dispense Refill Last Dose   gabapentin (NEURONTIN) 300 MG capsule Take 1 capsule (300 mg total) by mouth 3 (three) times daily. 90 capsule 0 11/06/2021   sildenafil (VIAGRA) 50 MG tablet Take 1 tablet (50 mg total) by mouth daily as needed for erectile dysfunction. 15 tablet 2 unk   tadalafil (CIALIS) 20 MG tablet Take 20 mg by mouth daily as needed.   unk   atorvastatin (LIPITOR) 40 MG tablet Take 40 mg by mouth at bedtime. (Patient not taking: Reported on 11/07/2021)   Not Taking   ciprofloxacin (CIPRO) 500 MG tablet Take 500 mg by mouth 2 (two) times daily. (Patient not taking: Reported on 11/07/2021)   Not Taking   insulin glargine-yfgn (SEMGLEE) 100 UNIT/ML injection Inject 20 Units into the skin See admin instructions. Inject 20 units under skin at bedtime for diabetes mellitus do not mix with other insulins in the same syringe-discard bottle 28 days after opening **replaces Lantus** (Patient not taking: Reported on 11/07/2021)   Not Taking   JANUMET 50-500 MG tablet Take  1 tablet by mouth 2 (two) times daily. (Patient not taking: Reported on 11/07/2021)   Not Taking   meloxicam (MOBIC) 15 MG tablet Take 15 mg by mouth daily as needed. (Patient not taking: Reported on 11/07/2021)   Not Taking   metFORMIN (GLUCOPHAGE) 500 MG tablet TAKE ONE TABLET BY MOUTH TWO TIMES A DAY FOR DIABETES (Patient not taking: Reported on 11/07/2021)   Not Taking   sertraline (ZOLOFT) 50 MG tablet Take 50 mg by mouth at bedtime. (Patient not taking: Reported on 11/07/2021)   Not Taking   traMADol (ULTRAM) 50 MG tablet Take 1 tablet (50 mg total) by mouth every 6 (six) hours as needed. (Patient not taking: Reported on 11/07/2021) 20 tablet 0 Not Taking     Vitals   Vitals:   11/07/21 1930 11/07/21 2045 11/07/21 2311 11/08/21 0300  BP:  (!) 138/96 135/84 (!) 131/90  Pulse:  77 60 67  Resp: (!) 22 (!) '22 20 18  '$ Temp:  97.8 F (36.6 C) 97.9 F (36.6 C) 98 F (36.7 C)  TempSrc:  Oral Oral Oral  SpO2:  97% 99% 93%  Weight:      Height:         Body mass index is 33.21 kg/m.  Physical Exam   General: Laying comfortably in bed; in no acute distress.  HENT: Normal oropharynx and mucosa. Normal external appearance of ears and nose.  Neck: Supple, no pain or tenderness  CV: No JVD. No peripheral edema.  Pulmonary: Symmetric Chest rise. Normal respiratory effort.  Abdomen: Soft to touch, non-tender.  Ext: No cyanosis, edema, or deformity  Skin: No rash. Normal palpation of skin.   Musculoskeletal: Normal digits and nails by inspection. No clubbing.   Neurologic Examination  Mental status/Cognition: Alert, oriented to self, place, month and year, good attention.  Speech/language: Fluent, comprehension intact, object naming intact, repetition intact. Cranial nerves:   CN II Pupils equal and reactive to light, no VF deficits   CN III,IV,VI EOM intact, no gaze preference or deviation, no nystagmus    CN V normal sensation in V1, V2, and V3 segments bilaterally    CN VII no  asymmetry, no nasolabial fold flattening    CN VIII normal hearing to speech    CN IX &  X normal palatal elevation, no uvular deviation    CN XI 5/5 head turn and 5/5 shoulder shrug bilaterally    CN XII midline tongue protrusion    Motor:  Muscle bulk: normal, tone normal Mvmt Root Nerve  Muscle Right Left Comments  SA C5/6 Ax Deltoid   Refused to participate with motor exam after testing finger abduction.  EF C5/6 Mc Biceps     EE C6/7/8 Rad Triceps     WF C6/7 Med FCR     WE C7/8 PIN ECU     F Ab C8/T1 U ADM/FDI 5 5   HF L1/2/3 Fem Illopsoas     KE L2/3/4 Fem Quad     DF L4/5 D Peron Tib Ant     PF S1/2 Tibial Grc/Sol      Reflexes: Did not give permission to test his reflexes.  Sensation: Does not want me to touch him to evaluate his sensation  Coordination/Complex Motor:   Unable to asses due to poor patient participation.  Labs   CBC:  Recent Labs  Lab 11/07/21 0141  WBC 11.0*  HGB 11.8*  HCT 38.6*  MCV 81.4  PLT 761    Basic Metabolic Panel:  Lab Results  Component Value Date   NA 139 11/07/2021   K 3.7 11/07/2021   CO2 25 11/07/2021   GLUCOSE 208 (H) 11/07/2021   BUN 18 11/07/2021   CREATININE 1.57 (H) 11/07/2021   CALCIUM 8.1 (L) 11/07/2021   GFRNONAA 48 (L) 11/07/2021   GFRAA >60 05/22/2019   Lipid Panel:  Lab Results  Component Value Date   LDLCALC 172 (H) 12/11/2020   HgbA1c:  Lab Results  Component Value Date   HGBA1C 9.9 (H) 12/11/2020   Urine Drug Screen:     Component Value Date/Time   LABOPIA NONE DETECTED 11/07/2021 0544   COCAINSCRNUR NONE DETECTED 11/07/2021 0544   LABBENZ NONE DETECTED 11/07/2021 0544   AMPHETMU NONE DETECTED 11/07/2021 0544   THCU NONE DETECTED 11/07/2021 0544   LABBARB NONE DETECTED 11/07/2021 0544    Alcohol Level     Component Value Date/Time   ETH <10 05/14/2019 1204    CT Head without contrast(Personally reviewed): 1. Hypodensity in the right temporal lobe, which is new from the prior exam,  concerning for infarct, favored to be subacute. CTA or MRI is recommended for further evaluation. 2. No acute fracture or traumatic listhesis in the cervical spine.  CT angio Head and Neck with contrast: pending  MRI Brain(Personally reviewed): 1. Evaluation is quite limited by motion artifact. A repeat MRI with and without contrast is recommended when the patient is better able to tolerate the exam. 2. Possible mass in or medial to the right temporal lobe. Edema in the right temporal lobe may be related to the mass. 3. Mildly increased signal on diffusion-weighted imaging in the left medial temporal lobe, with possible ADC correlate, which could be artifactual or reflect a small acute or subacute infarct. 4. Suspect trace subdural hemorrhage along the right temporal horn.  rEEG:  pending  Impression   [name] is a 66 y.o. male with PMH significant for with PMH significant for Coronary artery disease, chronic lower back pain, CKD, cocaine abuse, diabetes mellitus type 2, ischemic cardiomyopathy, sleep apnea, HLD, HTN who presents with back and genital pain and shortness of breath. Noted to be acting inappropriately specially towards male staff which prompted CT head which demonstrated R temporal lobe hypodensity felt to be a stroke. However,  MRI Brain was attempted and significantly motion degraded but shows R medial temporal lobe edema along with subtle DWI changes with minimal ADC correlate. Hard to interpret the scan but high suspicion for a R tempora lobe mass/infection but could also be a small stroke. Also concern for trace hemorrhage along with R temporal horn. Patient declines to provide much history or talk to me and refuses to participate with exam which makes it hard to narrow the differential further.  Differential includes stroke, CNS malignancy, HSV encephalitis. Also concern for trace hemorrhage on imaging, seizures, TBI.  Recommendations  - Agree with repeat MRI  Brain. Recommend getting MRI with and without contrast. - I ordered Ativan '1mg'$  every 15 mins PRN for 2 doses to help with getting MRI Brain. - hold off on DVT prophylaxis. No antiplatelet or anticoagulation until hemorrhage rule out on repeat imaging. - Acylcovir until MRI brain and if persistent suspicion for HSV on imaging, recommend getting LP. - Vit B1 levels are pending. Start thiamine '100mg'$  daily - routine EEG - Ammonia levels. ______________________________________________________________________   Thank you for the opportunity to take part in the care of this patient. If you have any further questions, please contact the neurology consultation attending.  Signed,  [place] Pager Number [phone] _ _ _   _ __   _ __ _ _  __ __   _ __   __ _

## 2021-11-08 NOTE — Progress Notes (Signed)
PROGRESS NOTE    AZELL BILL  XBJ:[id number] DOB: 02-May-1955 DOA: 11/07/2021 PCP: Carrolyn Meiers, MD   Brief Narrative:   66 y.o. male with medical history significant of Coronary artery disease, chronic lower back pain, CKD, cocaine abuse, diabetes mellitus type 2, ischemic cardiomyopathy, sleep apnea, and more presents the ED with a chief complaint of hurting in his back and pain and swelling of his testicles. Patient also reports that he fell yesterday.  He did hit his head.  He did not lose consciousness.  He reports that he was walking along and his legs just buckled underneath him.  He did not notice asymmetric weakness.  Patient ended up getting transferred to [facility] for MRI as CT suggested possible right temporal lobe hypodensity.  Assessment & Plan:  Principal Problem:   Acute respiratory failure with hypoxia (HCC) Active Problems:   Tobacco use disorder   Acute on chronic systolic CHF (congestive heart failure) (HCC)   Claudication in peripheral vascular disease (HCC)   Acute renal failure superimposed on stage 3b chronic kidney disease ([place])   Uncontrolled type 2 diabetes mellitus with hyperglycemia (HCC)   Stroke (cerebrum) (HCC)   Elevated troponin   Acute metabolic encephalopathy   Protein calorie malnutrition (HCC)   Cardiac volume overload   Acute metabolic encephalopathy - Described as hallucinations also with some inappropriate comments towards staff.  CT head showed concerns of temporal mass.  Patient transferred here from [facility] for MRI brain which was done but poor quality.  Overall showed concerns of temporal mass with surrounding edema.  He will still need repeat MRI.  No obvious evidence of infection, UA, UDS is negative.  Neurology team is following Suspect patient will need to be on steroids and seizure prophylaxis -TSH, B12 and folate are normal Neuro Sx consulted.    Acute respiratory failure with hypoxia (HCC) -  Likely from CHF exacerbation   Protein calorie malnutrition ([place]), adult - Encourage p.o. intake   Acute on chronic systolic CHF (congestive heart failure) ([place]), EF 30%, grade 3 DD -Repeat echo shows persistent depressed EF, EF 30% which is similar to previous EF.  Grade 3 diastolic dysfunction with signs of volume overload.  Currently getting IV diuretics, monitor urine output, strict input and output.  Daily weights.    Elevated troponin -Likely demand.  Troponins remain flat   Stroke (cerebrum) (HCC) -Suspicion for temporal lobe mass.  Attempt to get MRI   Uncontrolled type 2 diabetes mellitus with hyperglycemia (HCC) -Check A1c, lipid panel.  Sliding scale and Accu-Cheks   stage 3b chronic kidney disease ([place]) -Patient does not have acute kidney injury.  Creatinine is at baseline of 1.5   Claudication in peripheral vascular disease (HCC) 3.3 cm fusiform right common iliac artery aneurysm -CT abdomen pelvis shows peripheral vascular disease with occlusion of left common internal and proximal external iliac with reconstitution of left external iliac artery via inferior epigastric artery.  Follow-up outpatient vascular    Tobacco use disorder -Nicotine patch    DVT prophylaxis: SCDs Start: 11/07/21 0811 Code Status: Full Family Communication:    Patient needs MRI brain with contrast to further establish plan including EEG but at this time refusing everything and may leave AGAINST MEDICAL ADVICE  Subjective: When I saw the patient at bedside, refusing to participate in any kind of conversation regarding his medical care and allowing me to perform any kind of exam.  He is overall very aggressive and wants to leave  the hospital ASAP.   Examination:  Patient is not allowing to perform any kind of physical exam.  He is alert to name place and today's date. Objective: Vitals:   11/07/21 2045 11/07/21 2311 11/08/21 0300 11/08/21 0400  BP: (!) 138/96 135/84 (!) 131/90 (!)  118/99  Pulse: 77 60 67 69  Resp: (!) '22 20 18 17  '$ Temp: 97.8 F (36.6 C) 97.9 F (36.6 C) 98 F (36.7 C)   TempSrc: Oral Oral Oral   SpO2: 97% 99% 93% 91%  Weight:      Height:        Intake/Output Summary (Last 24 hours) at 11/08/2021 0816 Last data filed at 11/08/2021 0700 Gross per 24 hour  Intake 489.23 ml  Output 300 ml  Net 189.23 ml   Filed Weights   11/07/21 0049  Weight: 102 kg     Data Reviewed:   CBC: Recent Labs  Lab 11/07/21 0141  WBC 11.0*  HGB 11.8*  HCT 38.6*  MCV 81.4  PLT 737   Basic Metabolic Panel: Recent Labs  Lab 11/07/21 0141  NA 139  K 3.7  CL 107  CO2 25  GLUCOSE 208*  BUN 18  CREATININE 1.57*  CALCIUM 8.1*   GFR: Estimated Creatinine Clearance: 53.7 mL/min (A) (by C-G formula based on SCr of 1.57 mg/dL (H)). Liver Function Tests: Recent Labs  Lab 11/07/21 0141  AST 25  ALT 24  ALKPHOS 30*  BILITOT 0.0*  PROT 7.5  ALBUMIN 2.7*   Recent Labs  Lab 11/07/21 0141  LIPASE 24   Recent Labs  Lab 11/08/21 0556  AMMONIA 19   Coagulation Profile: No results for input(s): "INR", "PROTIME" in the last 168 hours. Cardiac Enzymes: No results for input(s): "CKTOTAL", "CKMB", "CKMBINDEX", "TROPONINI" in the last 168 hours. BNP (last 3 results) No results for input(s): "PROBNP" in the last 8760 hours. HbA1C: No results for input(s): "HGBA1C" in the last 72 hours. CBG: Recent Labs  Lab 11/07/21 1122 11/07/21 1712 11/07/21 2104  GLUCAP 225* 146* 134*   Lipid Profile: No results for input(s): "CHOL", "HDL", "LDLCALC", "TRIG", "CHOLHDL", "LDLDIRECT" in the last 72 hours. Thyroid Function Tests: Recent Labs    11/07/21 0821  TSH 2.489   Anemia Panel: Recent Labs    11/07/21 0821  VITAMINB12 793  FOLATE 6.5   Sepsis Labs: No results for input(s): "PROCALCITON", "LATICACIDVEN" in the last 168 hours.  Recent Results (from the past 240 hour(s))  Culture, blood (Routine X 2) w Reflex to ID Panel     Status:  None (Preliminary result)   Collection Time: 11/07/21  1:30 AM   Specimen: BLOOD RIGHT FOREARM  Result Value Ref Range Status   Specimen Description   Final    BLOOD RIGHT FOREARM BOTTLES DRAWN AEROBIC AND ANAEROBIC   Special Requests Blood Culture adequate volume  Final   Culture   Final    NO GROWTH < 24 HOURS Performed at [facility], [address] [place], [place] [address]    Report Status PENDING  Incomplete  Culture, blood (Routine X 2) w Reflex to ID Panel     Status: None (Preliminary result)   Collection Time: 11/07/21  1:41 AM   Specimen: Site Not Specified; Blood  Result Value Ref Range Status   Specimen Description   Final    SITE NOT SPECIFIED BOTTLES DRAWN AEROBIC AND ANAEROBIC   Special Requests Blood Culture adequate volume  Final   Culture   Final  NO GROWTH < 24 HOURS Performed at [facility], [address] [place], [place] [address]    Report Status PENDING  Incomplete         Radiology Studies: MR BRAIN WO CONTRAST  Result Date: 11/08/2021 CLINICAL DATA:  Fall, stroke suspected EXAM: MRI HEAD WITHOUT CONTRAST TECHNIQUE: Multiplanar, multiecho pulse sequences of the brain and surrounding structures were obtained without intravenous contrast. COMPARISON:  05/15/2019 MRI head, correlation is also made with CT head 11/07/2021 FINDINGS: Evaluation is quite limited by motion artifact. Brain: Mildly increased signal on diffusion-weighted imaging in the left medial temporal lobe (series 5, image 66), with possible ADC correlate (series 6, image 18); this area is associated with diffusely increased T2 hyperintense signal (series 11, image 8-11) which extends posteriorly into the right temporal lobe. On the T2 sequence, there is a suggestion of a T2 hyperintense mass in or medial to the medial right temporal lobe (series 10, image 6 and 7 and series 14, image 18). Suspect trace subdural fluid along the right temporal horn (series 11, image 9), likely trace subdural  hemorrhage. No hydrocephalus. Encephalomalacia in the right occipital lobe. Vascular: Grossly normal arterial flow voids. Skull and upper cervical spine: Normal marrow signal. Sinuses/Orbits: Clear paranasal sinuses. The orbits are unremarkable. Other: Fluid in the bilateral mastoid air cells. IMPRESSION: 1. Evaluation is quite limited by motion artifact. A repeat MRI with and without contrast is recommended when the patient is better able to tolerate the exam. 2. Possible mass in or medial to the right temporal lobe. Edema in the right temporal lobe may be related to the mass. 3. Mildly increased signal on diffusion-weighted imaging in the left medial temporal lobe, with possible ADC correlate, which could be artifactual or reflect a small acute or subacute infarct. 4. Suspect trace subdural hemorrhage along the right temporal horn. These results will be called to the ordering clinician or representative by the Radiologist Assistant, and communication documented in the PACS or [employer]. Electronically Signed   By: Merilyn Baba M.D.   On: 11/08/2021 00:40   DG CHEST PORT 1 VIEW  Result Date: 11/07/2021 CLINICAL DATA:  Shortness of breath, CHF EXAM: PORTABLE CHEST 1 VIEW COMPARISON:  Previous studies including the examination done earlier today FINDINGS: Transverse diameter of heart is increased. Central pulmonary vessels are prominent. Increased interstitial markings are seen in parahilar regions and lower lung fields suggesting pulmonary edema. Small bilateral pleural effusions are seen, more so on the right side. Overall, no significant interval changes are noted in comparison with the immediate previous study. IMPRESSION: Cardiomegaly. CHF. Small bilateral pleural effusions, more so on the right side. Electronically Signed   By: Elmer Picker M.D.   On: 11/07/2021 16:18   ECHOCARDIOGRAM COMPLETE  Result Date: 11/07/2021    ECHOCARDIOGRAM REPORT   Patient Name:   CONNELL BOGNAR Date of  Exam: 11/07/2021 Medical Rec #:  [id number]        Height:       69.0 in Accession #:    [id number]       Weight:       224.9 lb Date of Birth:  05-24-1955        BSA:          2.172 m Patient Age:    18 years         BP:           119/94 mmHg Patient Gender: M  HR:           81 bpm. Exam Location:  [facility] Procedure: 2D Echo, Cardiac Doppler and Color Doppler Indications:    Stroke I63.9  History:        Patient has prior history of Echocardiogram examinations, most                 recent 08/31/2020. Cardiomyopathy and CHF, CAD and Previous                 Myocardial Infarction, Stroke; Risk Factors:Hypertension,                 Diabetes, Dyslipidemia and Current Smoker. Acute metabolic                 encephalopathy. Hx of Apical mural thrombus. Cocaine use. S/P                 LAD DES June 2014.  Sonographer:    Alvino Chapel RCS Referring Phys: [id number] ASIA B [place]  Sonographer Comments: Somewhat difficult study due to patient moving around alot and c/o back pain. IMPRESSIONS  1. Left ventricular ejection fraction, by estimation, is 25 to 30%. The left ventricle has severely decreased function. The left ventricle demonstrates regional wall motion abnormalities (LAD and RCA infract pattern). There is moderate asymmetric left ventricular hypertrophy of the septal segment. Left ventricular diastolic parameters are consistent with Grade III diastolic dysfunction (restrictive). There is the interventricular septum is flattened in diastole ('D' shaped left ventricle), consistent with right ventricular volume overload and the interventricular septum is flattened in systole, consistent with right ventricular pressure overload.There is no apical thrombus on contrast images with aneursysmal apex.  2. Right ventricular systolic function is low normal. The right ventricular size is normal. There is mildly elevated pulmonary artery systolic pressure. The estimated right ventricular systolic pressure is  45.4 mmHg.  3. The mitral valve is abnormal. No evidence of mitral valve regurgitation. No evidence of mitral stenosis.  4. The aortic valve was not well visualized. There is mild calcification of the aortic valve. Aortic valve regurgitation is not visualized. Aortic valve sclerosis is present, with no evidence of aortic valve stenosis.  5. The inferior vena cava is dilated in size with <50% respiratory variability, suggesting right atrial pressure of 15 mmHg. Comparison(s): LVEF worse that prior reporting. FINDINGS  Left Ventricle: Left ventricular ejection fraction, by estimation, is 25 to 30%. The left ventricle has severely decreased function. The left ventricle demonstrates regional wall motion abnormalities. Definity contrast agent was given IV to delineate the left ventricular endocardial borders. The left ventricular internal cavity size was normal in size. There is moderate asymmetric left ventricular hypertrophy of the septal segment. The interventricular septum is flattened in diastole ('D' shaped left  ventricle), consistent with right ventricular volume overload and the interventricular septum is flattened in systole, consistent with right ventricular pressure overload. Left ventricular diastolic parameters are consistent with Grade III diastolic dysfunction (restrictive).  LV Wall Scoring: The entire apex and mid inferoseptal segment are akinetic. The mid anteroseptal segment, mid inferolateral segment, mid anterolateral segment, mid anterior segment, and mid inferior segment are hypokinetic. Right Ventricle: The right ventricular size is normal. No increase in right ventricular wall thickness. Right ventricular systolic function is low normal. There is mildly elevated pulmonary artery systolic pressure. The tricuspid regurgitant velocity is 2.73 m/s, and with an assumed right atrial pressure of 15 mmHg, the estimated right ventricular systolic pressure is 09.8 mmHg. Left  Atrium: Left atrial size was  normal in size. Right Atrium: Right atrial size was normal in size. Pericardium: Trivial pericardial effusion is present. Mitral Valve: The mitral valve is abnormal. No evidence of mitral valve regurgitation. No evidence of mitral valve stenosis. Tricuspid Valve: The tricuspid valve is normal in structure. Tricuspid valve regurgitation is trivial. No evidence of tricuspid stenosis. Aortic Valve: The aortic valve was not well visualized. There is mild calcification of the aortic valve. Aortic valve regurgitation is not visualized. Aortic valve sclerosis is present, with no evidence of aortic valve stenosis. Pulmonic Valve: The pulmonic valve was normal in structure. Pulmonic valve regurgitation is trivial. No evidence of pulmonic stenosis. Aorta: The aortic root is normal in size and structure and the ascending aorta was not well visualized. Venous: The inferior vena cava is dilated in size with less than 50% respiratory variability, suggesting right atrial pressure of 15 mmHg. IAS/Shunts: No atrial level shunt detected by color flow Doppler.  LEFT VENTRICLE PLAX 2D LVIDd:         5.40 cm   Diastology LVIDs:         4.60 cm   LV e' medial:    3.50 cm/s LV PW:         1.10 cm   LV E/e' medial:  25.1 LV IVS:        1.30 cm   LV e' lateral:   3.50 cm/s LVOT diam:     2.00 cm   LV E/e' lateral: 25.1 LV SV:         49 LV SV Index:   23 LVOT Area:     3.14 cm  RIGHT VENTRICLE RV S prime:     9.49 cm/s TAPSE (M-mode): 1.6 cm LEFT ATRIUM             Index        RIGHT ATRIUM           Index LA diam:        4.40 cm 2.03 cm/m   RA Area:     19.90 cm LA Vol (A2C):   59.8 ml 27.54 ml/m  RA Volume:   59.70 ml  27.49 ml/m LA Vol (A4C):   68.7 ml 31.64 ml/m LA Biplane Vol: 63.9 ml 29.43 ml/m  AORTIC VALVE LVOT Vmax:   91.40 cm/s LVOT Vmean:  55.900 cm/s LVOT VTI:    0.157 m  AORTA Ao Root diam: 3.50 cm MITRAL VALVE               TRICUSPID VALVE MV Area (PHT): 5.54 cm    TR Peak grad:   29.8 mmHg MV Decel Time: 137 msec     TR Vmax:        273.00 cm/s MV E velocity: 87.80 cm/s MV A velocity: 37.70 cm/s  SHUNTS MV E/A ratio:  2.33        Systemic VTI:  0.16 m                            Systemic Diam: 2.00 cm Rudean Haskell MD Electronically signed by Rudean Haskell MD Signature Date/Time: 11/07/2021/12:57:22 PM    Final    CT CHEST ABDOMEN PELVIS W CONTRAST  Result Date: 11/07/2021 CLINICAL DATA:  [place], [place] [id number] Trauma [id number]. Low back pain. Respiratory distress. EXAM: CT CHEST, ABDOMEN, AND PELVIS WITH CONTRAST TECHNIQUE: Multidetector CT imaging of the chest, abdomen and pelvis was performed following the standard  protocol during bolus administration of intravenous contrast. RADIATION DOSE REDUCTION: This exam was performed according to the departmental dose-optimization program which includes automated exposure control, adjustment of the mA and/or kV according to patient size and/or use of iterative reconstruction technique. CONTRAST:  133m OMNIPAQUE IOHEXOL 300 MG/ML  SOLN COMPARISON:  05/18/2019 FINDINGS: CT CHEST FINDINGS Cardiovascular: Multi-vessel long 7 coronary artery stenting has been performed. Global cardiac size is mildly enlarged. No pericardial effusion. Central pulmonary arteries are of normal caliber. Mild atherosclerotic calcification within the thoracic aorta. No aortic aneurysm. Mediastinum/Nodes: There is pathologic mediastinal adenopathy identified, new since prior examination with index lymph nodes within the right paratracheal lymph node group measuring up to 21 x 24 mm at axial image # 15/2. Visualized thyroid is unremarkable. Esophagus is unremarkable. Lungs/Pleura: Large right and small left pleural effusions are present. Ground-glass pulmonary infiltrate and smooth interlobular septal thickening is seen within the aerated lungs in keeping with mild to moderate pulmonary edema, possibly cardiogenic in nature. No pneumothorax. No central obstructing lesion. Musculoskeletal: No  chest wall mass or suspicious bone lesions identified. CT ABDOMEN PELVIS FINDINGS Hepatobiliary: No focal liver abnormality is seen. No gallstones, gallbladder wall thickening, or biliary dilatation. Pancreas: Unremarkable Spleen: Unremarkable Adrenals/Urinary Tract: Adrenal glands are unremarkable. Kidneys are normal, without renal calculi, focal lesion, or hydronephrosis. Bladder is unremarkable. Stomach/Bowel: Stomach, small bowel, and large bowel are unremarkable. Appendix not clearly identified and may be absent. Mild ascites is present. No free intraperitoneal gas. Vascular/Lymphatic: Extensive mixed atheromatous plaque is seen within the infrarenal abdominal aorta. Left common, internal, and proximal external iliac arteries are thrombosed. Reconstitution of the left external iliac artery is seen via the inferior epigastric artery. 2.1 x 3.3 cm fusiform right common iliac artery aneurysm is present demonstrating moderate mural thrombus. No pathologic adenopathy within the abdomen and pelvis. Reproductive: Prostate is unremarkable. Other: Mild diffuse subcutaneous edema within the abdominal wall. No abdominal wall hernia. Musculoskeletal: Osseous structures are age-appropriate. No acute bone abnormality. No lytic or blastic bone lesion. IMPRESSION: 1. No acute intrathoracic or intra-abdominal injury. 2. Pathologic mediastinal adenopathy, new since prior examination, suspicious for a lymphoproliferative process. PET CT examination or short-term follow-up CT examination in 3 months once the patient's acute issues have resolved may be helpful for further evaluation. 3. Mild to moderate pulmonary edema, possibly cardiogenic in nature. Large right and small left pleural effusions. Mild ascites and diffuse subcutaneous edema within the abdominal wall in keeping with mild anasarca. 4. Mild cardiomegaly. Multi-vessel coronary artery stenting. 5. Peripheral vascular disease with occlusion of the left common, internal,  and proximal external iliac arteries. Reconstitution of the left external iliac artery via the inferior epigastric artery. 6. 3.3 cm fusiform right common iliac artery aneurysm. Aortic Atherosclerosis (ICD10-I70.0). Electronically Signed   By: AFidela SalisburyM.D.   On: 11/07/2021 04:23   CT Head Wo Contrast  Result Date: 11/07/2021 CLINICAL DATA:  Low back pain; patient is concerned he may have a UTI; increased work of breathing EXAM: CT HEAD WITHOUT CONTRAST CT CERVICAL SPINE WITHOUT CONTRAST TECHNIQUE: Multidetector CT imaging of the head and cervical spine was performed following the standard protocol without intravenous contrast. Multiplanar CT image reconstructions of the cervical spine were also generated. RADIATION DOSE REDUCTION: This exam was performed according to the departmental dose-optimization program which includes automated exposure control, adjustment of the mA and/or kV according to patient size and/or use of iterative reconstruction technique. COMPARISON:  CT head 05/14/2019, CT cervical spine 10/15/2018 FINDINGS: CT HEAD FINDINGS  Brain: Hypodensity in the right temporal lobe, which is new from the prior exam (series 2, images 2-14). The previously noted hypodensity in the right occipital lobe has become more hypodense, consistent with expected evolution of a now remote infarct. No evidence of acute scratch hemorrhage, mass, mass effect, or midline shift. No hydrocephalus or extra-axial fluid collection. Periventricular white matter changes, likely the sequela of chronic small vessel ischemic disease. Vascular: No hyperdense vessel. Skull: Normal. Negative for fracture or focal lesion. Sinuses/Orbits: Minimal mucosal thickening in the maxillary sinuses. The orbits are unremarkable. Other: The mastoid air cells are well aerated. CT CERVICAL SPINE FINDINGS Alignment: Reversal of the normal cervical lordosis.  No listhesis. Skull base and vertebrae: No acute fracture. No primary bone lesion or  focal pathologic process. Soft tissues and spinal canal: No prevertebral fluid or swelling. No visible canal hematoma. Disc levels: Multilevel degenerative disc disease with disc height loss C3-C7. No obvious spinal canal stenosis. Upper chest: Please see same-day CT chest Other: None. IMPRESSION: 1. Hypodensity in the right temporal lobe, which is new from the prior exam, concerning for infarct, favored to be subacute. CTA or MRI is recommended for further evaluation. 2. No acute fracture or traumatic listhesis in the cervical spine. These results were called by telephone at the time of interpretation on 11/07/2021 at 4:12 am to provider DAVID [facility] , who verbally acknowledged these results. Electronically Signed   By: Merilyn Baba M.D.   On: 11/07/2021 04:13   CT Cervical Spine Wo Contrast  Result Date: 11/07/2021 CLINICAL DATA:  Low back pain; patient is concerned he may have a UTI; increased work of breathing EXAM: CT HEAD WITHOUT CONTRAST CT CERVICAL SPINE WITHOUT CONTRAST TECHNIQUE: Multidetector CT imaging of the head and cervical spine was performed following the standard protocol without intravenous contrast. Multiplanar CT image reconstructions of the cervical spine were also generated. RADIATION DOSE REDUCTION: This exam was performed according to the departmental dose-optimization program which includes automated exposure control, adjustment of the mA and/or kV according to patient size and/or use of iterative reconstruction technique. COMPARISON:  CT head 05/14/2019, CT cervical spine 10/15/2018 FINDINGS: CT HEAD FINDINGS Brain: Hypodensity in the right temporal lobe, which is new from the prior exam (series 2, images 2-14). The previously noted hypodensity in the right occipital lobe has become more hypodense, consistent with expected evolution of a now remote infarct. No evidence of acute scratch hemorrhage, mass, mass effect, or midline shift. No hydrocephalus or extra-axial fluid collection.  Periventricular white matter changes, likely the sequela of chronic small vessel ischemic disease. Vascular: No hyperdense vessel. Skull: Normal. Negative for fracture or focal lesion. Sinuses/Orbits: Minimal mucosal thickening in the maxillary sinuses. The orbits are unremarkable. Other: The mastoid air cells are well aerated. CT CERVICAL SPINE FINDINGS Alignment: Reversal of the normal cervical lordosis.  No listhesis. Skull base and vertebrae: No acute fracture. No primary bone lesion or focal pathologic process. Soft tissues and spinal canal: No prevertebral fluid or swelling. No visible canal hematoma. Disc levels: Multilevel degenerative disc disease with disc height loss C3-C7. No obvious spinal canal stenosis. Upper chest: Please see same-day CT chest Other: None. IMPRESSION: 1. Hypodensity in the right temporal lobe, which is new from the prior exam, concerning for infarct, favored to be subacute. CTA or MRI is recommended for further evaluation. 2. No acute fracture or traumatic listhesis in the cervical spine. These results were called by telephone at the time of interpretation on 11/07/2021 at 4:12 am to provider  DAVID Roxanne Mins , who verbally acknowledged these results. Electronically Signed   By: Merilyn Baba M.D.   On: 11/07/2021 04:13   DG Chest Port 1 View  Result Date: 11/07/2021 CLINICAL DATA:  Shortness of breath. EXAM: PORTABLE CHEST 1 VIEW COMPARISON:  Chest radiograph dated 05/14/2019. FINDINGS: Cardiomegaly with vascular congestion and edema. Small right pleural effusion and right lung base atelectasis. Pneumonia is not excluded. No pneumothorax. No acute osseous pathology. Degenerative changes of the spine. IMPRESSION: 1. Cardiomegaly with vascular congestion and edema. 2. Small right pleural effusion. Electronically Signed   By: Anner Crete M.D.   On: 11/07/2021 01:22        Scheduled Meds:   stroke: early stages of recovery book   Does not apply Once   carvedilol  3.125 mg  Oral BID WC   feeding supplement  237 mL Oral BID BM   furosemide  40 mg Intravenous Q12H   insulin aspart  0-15 Units Subcutaneous TID WC   insulin aspart  0-5 Units Subcutaneous QHS   nicotine  21 mg Transdermal Daily   thiamine (VITAMIN B1) injection  100 mg Intravenous Daily   Or   thiamine  100 mg Oral Daily   Continuous Infusions:  sodium chloride 50 mL/hr at 11/08/21 0622   acyclovir (ZOVIRAX) 900 mg in dextrose 5 % 250 mL IVPB       LOS: 1 day   Time spent= 35 mins    Alixis Harmon Arsenio Loader, MD [facility]  If 7PM-7AM, please contact night-coverage  11/08/2021, 8:16 AM

## 2021-11-08 NOTE — Progress Notes (Signed)
OT Cancellation Note  Patient Details Name: PRUITT TABOADA MRN: [id number] DOB: 01-Nov-1955   Cancelled Treatment:    Reason Eval/Treat Not Completed: Pain limiting ability to participate.  Pt just finishing with MD, adamant that he is leaving and "not doing anything".  Will check back tomorrow if pt is still in the hospital.    Abdulahad Mederos OTR/L  11/08/2021, 11:16 AM

## 2021-11-08 NOTE — Progress Notes (Signed)
Results from pt's MRI this evening noted; on call provider notified at this time. Awaiting response.

## 2021-11-08 NOTE — Evaluation (Signed)
Physical Therapy Evaluation Patient Details Name: [name] MRN: [id number] DOB: 26-May-1955 Today's Date: 11/08/2021  History of Present Illness  Pt presented to [facility] on 11/07/21 back pain, confusion and falls. Pt with hallucinations and inappropriate behavior towards staff. CT showed hypodensity in the rt temporal lobe. Transferred to [facility] on 10/29 for MRI.  MRI showed possible mass rt temporal lobe, suspected trace SDH rt temporal horn, and possible small acute or subacute infarct lt temporal lobe. Pt needs repeat MRI due to motion artifact. Pt with chest pain in early AM of 10/30 and found to have 1st degree heart block. PMH - CAD, cocaine abuse, ckd, chronic back pain, DM, sleep apnea, chf, pvd, medical noncompliance (has refused cardiac medications).  Clinical Impression  Entered room as bed alarm going off and pt sitting at EOB at bottom of bed. Pt had removed all electrodes/wires from telemetry unit and IV stretched across bed. Pt stood to use urinal and then I was able to convince him to amb around the bed to the recliner. He refused any further mobility. Suspect he is fairly close to baseline with mobility. Pt stated he was leaving today and didn't care what the doctor said.        Recommendations for follow up therapy are one component of a multi-disciplinary discharge planning process, led by the attending physician.  Recommendations may be updated based on patient status, additional functional criteria and insurance authorization.  Follow Up Recommendations No PT follow up      Assistance Recommended at Discharge Intermittent Supervision/Assistance  Patient can return home with the following  Assist for transportation;Help with stairs or ramp for entrance;Assistance with cooking/housework    Equipment Recommendations None recommended by PT  Recommendations for Other Services       Functional Status Assessment Patient has had a recent decline in their functional status and  demonstrates the ability to make significant improvements in function in a reasonable and predictable amount of time.     Precautions / Restrictions Precautions Precautions: Fall      Mobility  Bed Mobility               General bed mobility comments: Pt sitting EOB    Transfers Overall transfer level: Needs assistance Equipment used: None, Rolling walker (2 wheels) Transfers: Sit to/from Stand Sit to Stand: Supervision           General transfer comment: supervision for safety    Ambulation/Gait Ambulation/Gait assistance: Supervision Gait Distance (Feet): 12 Feet Assistive device: None Gait Pattern/deviations: Step-through pattern, Decreased stride length Gait velocity: decr Gait velocity interpretation: 1.31 - 2.62 ft/sec, indicative of limited community ambulator   General Gait Details: supervision for lines/safety. Pt refused use of walker. Reaching for external support intermittently when amb around bed. Pt refused further amb.  Stairs            Wheelchair Mobility    Modified Rankin (Stroke Patients Only)       Balance Overall balance assessment: Needs assistance, History of Falls Sitting-balance support: No upper extremity supported, Feet supported Sitting balance-Leahy Scale: Good     Standing balance support: No upper extremity supported, During functional activity Standing balance-Leahy Scale: Fair                               Pertinent Vitals/Pain Pain Assessment Pain Assessment: Faces Faces Pain Scale: Hurts a little bit Pain Location: back and testicles Pain Descriptors /  Indicators: Guarding Pain Intervention(s): Limited activity within patient's tolerance    Home Living Family/patient expects to be discharged to:: Private residence Living Arrangements: Alone Available Help at Discharge: Family;Friend(s);Available PRN/intermittently Type of Home: Mobile home Home Access: Stairs to enter Entrance  Stairs-Rails: Right;Left;Can reach both Entrance Stairs-Number of Steps: 2   Home Layout: One level Home Equipment: Rollator (4 wheels)      Prior Function Prior Level of Function : Independent/Modified Independent             Mobility Comments: pt reports recent falls. Has rollator but doesn't sound like he uses it. Pt easily irritated when asked questions ADLs Comments: Doesn't drive and says he has help with shopping. Pt easily irritated when asked questions.     Hand Dominance        Extremity/Trunk Assessment   Upper Extremity Assessment Upper Extremity Assessment: Defer to OT evaluation    Lower Extremity Assessment Lower Extremity Assessment: Overall WFL for tasks assessed       Communication   Communication: No difficulties  Cognition Arousal/Alertness: Awake/alert Behavior During Therapy: WFL for tasks assessed/performed Overall Cognitive Status: Difficult to assess                                 General Comments: Pt following commands, oriented. Poor awareness of medical issues and poor safety awareness both of which may be baseline.        General Comments      Exercises     Assessment/Plan    PT Assessment Patient needs continued PT services  PT Problem List Decreased balance;Decreased mobility;Decreased cognition;Decreased safety awareness       PT Treatment Interventions DME instruction;Gait training;Functional mobility training;Therapeutic activities;Therapeutic exercise;Balance training;Patient/family education    PT Goals (Current goals can be found in the Care Plan section)  Acute Rehab PT Goals Patient Stated Goal: go home today PT Goal Formulation: With patient Time For Goal Achievement: 11/22/21 Potential to Achieve Goals: Good    Frequency Min 3X/week     Co-evaluation               AM-PAC PT "6 Clicks" Mobility  Outcome Measure Help needed turning from your back to your side while in a flat bed  without using bedrails?: None Help needed moving from lying on your back to sitting on the side of a flat bed without using bedrails?: None Help needed moving to and from a bed to a chair (including a wheelchair)?: A Little Help needed standing up from a chair using your arms (e.g., wheelchair or bedside chair)?: A Little Help needed to walk in hospital room?: A Little Help needed climbing 3-5 steps with a railing? : A Little 6 Click Score: 20    End of Session   Activity Tolerance: Other (comment);Patient tolerated treatment well (Self limiting) Patient left: in chair;with chair alarm set;Other (comment);with call bell/phone within reach (EEG tech present) Nurse Communication: Mobility status PT Visit Diagnosis: Unsteadiness on feet (R26.81);History of falling (Z91.81)    Time: 1030-1050 PT Time Calculation (min) (ACUTE ONLY): 20 min   Charges:   PT Evaluation $PT Eval Low Complexity: [place] [facility] [place] 11/08/2021, 1:53 PM

## 2021-11-08 NOTE — Consult Note (Signed)
Full consult note to follow, MRI is non-diagnostic due to motion artifact, rpt MRI pending, will re-evaluate after the repeat MRI. If tehre is some trace SDH along the anterior temporal tip. Again, non-diagnostic, but if present it is minimal and does not require surgical intervention. Pt not anticoagulated.

## 2021-11-08 NOTE — Consult Note (Signed)
Neurosurgery Consultation  Reason for Consult: Intracranial imaging abnormality Referring Physician: Reesa Chew  CC: Altered mental status  HPI: This is a 66 y.o. man with complicated PMHx that presents with back pain, had some abnormal behavior, therefore an MRI was ordered. This showed a right temporal abnormality, prompting a stat consult to neurosurgery. The patient denies a history of seizures or seizure-like episodes, no aura, he does endorse feeling a bit unlike himself and somnolent, denies any headaches or history of malignancy. He felt claustrophobic in the MRI and is quite adamant about not having any further imaging, despite me explaining the options regarding pharmacologic assistance and/or alternative imaging modalities like a CT with contrast. He denies any new weakness, numbness, or parasthesias. No recent use of anti-platelet or anti-coagulant medications.   ROS: A 14 point ROS was performed and is negative except as noted in the HPI.   PMHx:  Past Medical History:  Diagnosis Date   Bulging lumbar disc    CAD (coronary artery disease) [id number]   a. prior LAD stenting. b. s/p DES to [facility] 08/2015. c. 04/2016 Cardiac cath at [facility]. Patent stent in the PLAD and RCA. Diffuse dLAD, OM2, and  RPDA disease. d. DES to PDA and distal RCA 03/2019    Chronic lower back pain    CKD (chronic kidney disease), stage II    Cocaine abuse ([place])    DM2 (diabetes mellitus, type 2) (HCC)    Essential hypertension    GERD (gastroesophageal reflux disease)    Headache    History of pneumonia    Hyperlipidemia    Ischemic cardiomyopathy    LV (left ventricular) mural thrombus    Sleep apnea    FamHx:  Family History  Problem Relation Age of Onset   Hypertension Mother    Diabetes Mother    SocHx:  reports that he has been smoking cigarettes. He has a 24.00 pack-year smoking history. He has never used smokeless tobacco. He reports that he does not currently use alcohol. He reports that  he does not use drugs.  Exam: Vital signs in last 24 hours: Temp:  [97.7 F (36.5 C)-98 F (36.7 C)] 98 F (36.7 C) (10/30 0300) Pulse Rate:  [60-77] 69 (10/30 0400) Resp:  [17-25] 17 (10/30 0400) BP: (109-138)/(81-99) 118/99 (10/30 0400) SpO2:  [91 %-100 %] 91 % (10/30 0400) General: Lying in bed in NAD, mostly cooperative with exam Head: Normocephalic and atruamatic HEENT: Neck supple Pulmonary: breathing room air comfortably, no evidence of increased work of breathing Cardiac: RRR Abdomen: abdominal adiposity, mildly protuberant, non-tender Extremities: Warm and well perfused x4 Neuro: Mildly somnolent but awake for the entire exam, Ox3, gaze conjugate, FS, +dysarthric Strength 5/5 x4, SILTx4  Assessment and Plan: 66 y.o. man with AMS during hospitalization, prompting imaging. MRI brain personally reviewed, non-diagnostic due to artifact. Most obvious finding is that there is edema throughout the right anterior temporal lobe.   -discussed with the patient that I recommend repeat imaging and the risks of ignoring this, as he states he would rather do. Explained that we don't know what it is, but certainly could be a stroke / tumor / or other life threatening pathology that's potentially treatable  Judith Part, MD 11/08/21 2:27 PM [facility]

## 2021-11-08 NOTE — Progress Notes (Addendum)
Subjective: Lying comfortably in bed.   Objective: Current vital signs: BP (!) 118/99 (BP Location: Left Arm)   Pulse 69   Temp 98 F (36.7 C) (Oral)   Resp 17   Ht '5\' 9"'$  (1.753 m)   Wt 102 kg   SpO2 91%   BMI 33.21 kg/m  Vital signs in last 24 hours: Temp:  [97.7 F (36.5 C)-98.1 F (36.7 C)] 98 F (36.7 C) (10/30 0300) Pulse Rate:  [60-77] 69 (10/30 0400) Resp:  [15-29] 17 (10/30 0400) BP: (109-156)/(81-109) 118/99 (10/30 0400) SpO2:  [91 %-100 %] 91 % (10/30 0400)  Intake/Output from previous day: 10/29 0701 - 10/30 0700 In: 489.2 [P.O.:480; I.V.:9.2] Out: 300 [Urine:300] Intake/Output this shift: No intake/output data recorded. Nutritional status:  Diet Order             Diet Heart Room service appropriate? Yes; Fluid consistency: Thin; Fluid restriction: 1500 mL Fluid  Diet effective now                  HEENT: [place]/AT Lungs: Respirations unlabored Ext: No edema  Neurologic Exam: Ment: Awake and alert. Oriented to the day, month, year, city and state. Speech is fluent with intact comprehension. Able to follow all commands. Somewhat reluctant to comply with exam or provide much history. Frequently asks when he will be going home. States that he does not want any further imaging.  CN: Eyes are conjugate. Fixates and tracks normally. Does not cooperate with testing of pupillary light reflexes. Face symmetric. Phonation intact. Tongue protrudes midline.  Motor: 5/5 RUE and BLE. There is subtle weakness of LUE proximally and distally, but rotating fingers test is nonlateralizing.  Sensory: Intact to FT bilaterally x 4 with no extinction to DSS.  Reflexes: 1+ bilateral brachioradialis. Unable to elicit patellar or achilles reflexes but patient poorly positioned for testing due to semicompliance with exam.  Cerebellar: No ataxia with FNF bilaterally Gait: Deferred    Lab Results: Results for orders placed or performed during the hospital encounter of 11/07/21  (from the past 48 hour(s))  Urinalysis, Routine w reflex microscopic Urine, Clean Catch     Status: Abnormal   Collection Time: 11/07/21 12:53 AM  Result Value Ref Range   Color, Urine AMBER (A) YELLOW    Comment: BIOCHEMICALS MAY BE AFFECTED BY COLOR   APPearance HAZY (A) CLEAR   Specific Gravity, Urine 1.027 1.005 - 1.030   pH 5.0 5.0 - 8.0   Glucose, UA 50 (A) NEGATIVE mg/dL   Hgb urine dipstick NEGATIVE NEGATIVE   Bilirubin Urine NEGATIVE NEGATIVE   Ketones, ur NEGATIVE NEGATIVE mg/dL   Protein, ur >=300 (A) NEGATIVE mg/dL   Nitrite NEGATIVE NEGATIVE   Leukocytes,Ua NEGATIVE NEGATIVE   RBC / HPF 0-5 0 - 5 RBC/hpf   WBC, UA 0-5 0 - 5 WBC/hpf   Bacteria, UA NONE SEEN NONE SEEN   Mucus PRESENT    Hyaline Casts, UA PRESENT     Comment: Performed at [facility], [address], [place], [place] [address]  Culture, blood (Routine X 2) w Reflex to ID Panel     Status: None (Preliminary result)   Collection Time: 11/07/21  1:30 AM   Specimen: BLOOD RIGHT FOREARM  Result Value Ref Range   Specimen Description      BLOOD RIGHT FOREARM BOTTLES DRAWN AEROBIC AND ANAEROBIC   Special Requests Blood Culture adequate volume    Culture      NO GROWTH < 24 HOURS Performed  at [facility], [address], [place], [place] [address]    Report Status PENDING   Comprehensive metabolic panel     Status: Abnormal   Collection Time: 11/07/21  1:41 AM  Result Value Ref Range   Sodium 139 135 - 145 mmol/L   Potassium 3.7 3.5 - 5.1 mmol/L   Chloride 107 98 - 111 mmol/L   CO2 25 22 - 32 mmol/L   Glucose, Bld 208 (H) 70 - 99 mg/dL    Comment: Glucose reference range applies only to samples taken after fasting for at least 8 hours.   BUN 18 8 - 23 mg/dL   Creatinine, Ser 1.57 (H) 0.61 - 1.24 mg/dL   Calcium 8.1 (L) 8.9 - 10.3 mg/dL   Total Protein 7.5 6.5 - 8.1 g/dL   Albumin 2.7 (L) 3.5 - 5.0 g/dL   AST 25 15 - 41 U/L   ALT 24 0 - 44 U/L   Alkaline Phosphatase 30 (L) 38 - 126 U/L   Total  Bilirubin 0.0 (L) 0.3 - 1.2 mg/dL   GFR, Estimated 48 (L) >60 mL/min    Comment: (NOTE) Calculated using the CKD-EPI Creatinine Equation (2021)    Anion gap 7 5 - 15    Comment: Performed at [facility], [address] [place], [place] [address]  CBC     Status: Abnormal   Collection Time: 11/07/21  1:41 AM  Result Value Ref Range   WBC 11.0 (H) 4.0 - 10.5 K/uL   RBC 4.74 4.22 - 5.81 MIL/uL   Hemoglobin 11.8 (L) 13.0 - 17.0 g/dL   HCT 38.6 (L) 39.0 - 52.0 %   MCV 81.4 80.0 - 100.0 fL   MCH 24.9 (L) 26.0 - 34.0 pg   MCHC 30.6 30.0 - 36.0 g/dL   RDW 21.5 (H) 11.5 - 15.5 %   Platelets 265 150 - 400 K/uL   nRBC 0.2 0.0 - 0.2 %    Comment: Performed at [facility], [address] [place], [place] [address]  Lipase, blood     Status: None   Collection Time: 11/07/21  1:41 AM  Result Value Ref Range   Lipase 24 11 - 51 U/L    Comment: Performed at [facility], [address], [place], [place] [address]  Culture, blood (Routine X 2) w Reflex to ID Panel     Status: None (Preliminary result)   Collection Time: 11/07/21  1:41 AM   Specimen: Site Not Specified; Blood  Result Value Ref Range   Specimen Description      SITE NOT SPECIFIED BOTTLES DRAWN AEROBIC AND ANAEROBIC   Special Requests Blood Culture adequate volume    Culture      NO GROWTH < 24 HOURS Performed at [facility], [address], [place],  [address]    Report Status PENDING   Brain natriuretic peptide     Status: Abnormal   Collection Time: 11/07/21  1:41 AM  Result Value Ref Range   B Natriuretic Peptide 1,157.0 (H) 0.0 - 100.0 pg/mL    Comment: Performed at [facility], [address], [place],  [address]  Troponin I (High Sensitivity)     Status: Abnormal   Collection Time: 11/07/21  1:41 AM  Result Value Ref Range   Troponin I (High Sensitivity) 31 (H) <18 ng/L    Comment: (NOTE) Elevated high sensitivity troponin I (hsTnI) values and significant  changes across serial measurements may  suggest ACS but many other  chronic  and acute conditions are known to elevate hsTnI results.  Refer to the "Links" section for chest pain algorithms and additional  guidance. Performed at [facility], [address] [place], [place] [address]   Rapid urine drug screen (hospital performed)     Status: None   Collection Time: 11/07/21  5:44 AM  Result Value Ref Range   Opiates NONE DETECTED NONE DETECTED   Cocaine NONE DETECTED NONE DETECTED   Benzodiazepines NONE DETECTED NONE DETECTED   Amphetamines NONE DETECTED NONE DETECTED   Tetrahydrocannabinol NONE DETECTED NONE DETECTED   Barbiturates NONE DETECTED NONE DETECTED    Comment: (NOTE) DRUG SCREEN FOR MEDICAL PURPOSES ONLY.  IF CONFIRMATION IS NEEDED FOR ANY PURPOSE, NOTIFY LAB WITHIN 5 DAYS.  LOWEST DETECTABLE LIMITS FOR URINE DRUG SCREEN Drug Class                     Cutoff (ng/mL) Amphetamine and metabolites    1000 Barbiturate and metabolites    200 Benzodiazepine                 200 Opiates and metabolites        300 Cocaine and metabolites        300 THC                            50 Performed at [facility], [address] [place], [place] [address]   Urine Culture     Status: None   Collection Time: 11/07/21  8:11 AM   Specimen: Urine, Clean Catch  Result Value Ref Range   Specimen Description      URINE, CLEAN CATCH Performed at [facility], [address], [place], [place] [address]    Special Requests      NONE Performed at [facility], [address], [place], [place] [address]    Culture      NO GROWTH Performed at [facility], [place] [address], [place], [place] [address]    Report Status 11/08/2021 FINAL   HIV Antibody (routine testing w rflx)     Status: None   Collection Time: 11/07/21  8:21 AM  Result Value Ref Range   HIV Screen 4th Generation wRfx Non Reactive Non Reactive    Comment: Performed at [facility], [place] [address] [place], [place] [address]  Troponin I (High  Sensitivity)     Status: Abnormal   Collection Time: 11/07/21  8:21 AM  Result Value Ref Range   Troponin I (High Sensitivity) 38 (H) <18 ng/L    Comment: (NOTE) Elevated high sensitivity troponin I (hsTnI) values and significant  changes across serial measurements may suggest ACS but many other  chronic and acute conditions are known to elevate hsTnI results.  Refer to the "Links" section for chest pain algorithms and additional  guidance. Performed at [facility], [address] [place], [place] [address]   TSH     Status: None   Collection Time: 11/07/21  8:21 AM  Result Value Ref Range   TSH 2.489 0.350 - 4.500 uIU/mL    Comment: Performed by a 3rd Generation assay with a functional sensitivity of <=0.01 uIU/mL. Performed at [facility], [address] [place], [place] [address]   Vitamin B12     Status: None   Collection Time: 11/07/21  8:21 AM  Result Value Ref Range   Vitamin B-12 793 180 - 914 pg/mL  Comment: (NOTE) This assay is not validated for testing neonatal or myeloproliferative syndrome specimens for Vitamin B12 levels. Performed at [facility], [address] [place], [place] [address]   Folate     Status: None   Collection Time: 11/07/21  8:21 AM  Result Value Ref Range   Folate 6.5 >5.9 ng/mL    Comment: Performed at [facility], [address], [place], [place] [address]  Blood gas, venous     Status: Abnormal   Collection Time: 11/07/21  8:33 AM  Result Value Ref Range   pH, Ven 7.37 7.25 - 7.43   pCO2, Ven 59 44 - 60 mmHg   pO2, Ven <31 (LL) 32 - 45 mmHg    Comment: CRITICAL RESULT CALLED TO, READ BACK BY AND VERIFIED WITH: CRAWFORD H @ 0846 ON [id number] BY HENDERSON L    Bicarbonate 34.5 (H) 20.0 - 28.0 mmol/L   Acid-Base Excess 7.0 (H) 0.0 - 2.0 mmol/L   O2 Saturation 16.8 %   Patient temperature 36.4    Collection site LEFT ANTECUBITAL    Drawn by [id number]     Comment: Performed at [facility], [address], [place], [place] [address]  CBG  monitoring, ED     Status: Abnormal   Collection Time: 11/07/21 11:22 AM  Result Value Ref Range   Glucose-Capillary 225 (H) 70 - 99 mg/dL    Comment: Glucose reference range applies only to samples taken after fasting for at least 8 hours.  CBG monitoring, ED     Status: Abnormal   Collection Time: 11/07/21  5:12 PM  Result Value Ref Range   Glucose-Capillary 146 (H) 70 - 99 mg/dL    Comment: Glucose reference range applies only to samples taken after fasting for at least 8 hours.  Glucose, capillary     Status: Abnormal   Collection Time: 11/07/21  9:04 PM  Result Value Ref Range   Glucose-Capillary 134 (H) 70 - 99 mg/dL    Comment: Glucose reference range applies only to samples taken after fasting for at least 8 hours.  Troponin I (High Sensitivity)     Status: Abnormal   Collection Time: 11/08/21  3:05 AM  Result Value Ref Range   Troponin I (High Sensitivity) 31 (H) <18 ng/L    Comment: (NOTE) Elevated high sensitivity troponin I (hsTnI) values and significant  changes across serial measurements may suggest ACS but many other  chronic and acute conditions are known to elevate hsTnI results.  Refer to the "Links" section for chest pain algorithms and additional  guidance. Performed at [facility], [place] [address], [place], [place] [address]   Troponin I (High Sensitivity)     Status: Abnormal   Collection Time: 11/08/21  4:41 AM  Result Value Ref Range   Troponin I (High Sensitivity) 32 (H) <18 ng/L    Comment: (NOTE) Elevated high sensitivity troponin I (hsTnI) values and significant  changes across serial measurements may suggest ACS but many other  chronic and acute conditions are known to elevate hsTnI results.  Refer to the "Links" section for chest pain algorithms and additional  guidance. Performed at [facility], [place] [address] [place], [place] [address]   Ammonia     Status: None   Collection Time: 11/08/21  5:56 AM  Result Value Ref Range    Ammonia 19 9 - 35 umol/L    Comment: Performed at [facility], [place] [address] [place], [place] [address]  Glucose, capillary     Status: Abnormal   Collection Time: 11/08/21  8:15 AM  Result Value Ref Range   Glucose-Capillary 177 (H) 70 - 99 mg/dL    Comment: Glucose reference range applies only to samples taken after fasting for at least 8 hours.    Recent Results (from the past 240 hour(s))  Culture, blood (Routine X 2) w Reflex to ID Panel     Status: None (Preliminary result)   Collection Time: 11/07/21  1:30 AM   Specimen: BLOOD RIGHT FOREARM  Result Value Ref Range Status   Specimen Description   Final    BLOOD RIGHT FOREARM BOTTLES DRAWN AEROBIC AND ANAEROBIC   Special Requests Blood Culture adequate volume  Final   Culture   Final    NO GROWTH < 24 HOURS Performed at [facility], [address], [place], [place] [address]    Report Status PENDING  Incomplete  Culture, blood (Routine X 2) w Reflex to ID Panel     Status: None (Preliminary result)   Collection Time: 11/07/21  1:41 AM   Specimen: Site Not Specified; Blood  Result Value Ref Range Status   Specimen Description   Final    SITE NOT SPECIFIED BOTTLES DRAWN AEROBIC AND ANAEROBIC   Special Requests Blood Culture adequate volume  Final   Culture   Final    NO GROWTH < 24 HOURS Performed at [facility], [address], [place], [place] [address]    Report Status PENDING  Incomplete  Urine Culture     Status: None   Collection Time: 11/07/21  8:11 AM   Specimen: Urine, Clean Catch  Result Value Ref Range Status   Specimen Description   Final    URINE, CLEAN CATCH Performed at [facility], [address] [place], [place] [address]    Special Requests   Final    NONE Performed at [facility], [address], [place], [place] [address]    Culture   Final    NO GROWTH Performed at [facility], [place] [address], [place], [place] [address]    Report Status 11/08/2021 FINAL  Final    Lipid  Panel No results for input(s): "CHOL", "TRIG", "HDL", "CHOLHDL", "VLDL", "LDLCALC" in the last 72 hours.  Studies/Results: MR BRAIN WO CONTRAST  Result Date: 11/08/2021 CLINICAL DATA:  Fall, stroke suspected EXAM: MRI HEAD WITHOUT CONTRAST TECHNIQUE: Multiplanar, multiecho pulse sequences of the brain and surrounding structures were obtained without intravenous contrast. COMPARISON:  05/15/2019 MRI head, correlation is also made with CT head 11/07/2021 FINDINGS: Evaluation is quite limited by motion artifact. Brain: Mildly increased signal on diffusion-weighted imaging in the left medial temporal lobe (series 5, image 66), with possible ADC correlate (series 6, image 18); this area is associated with diffusely increased T2 hyperintense signal (series 11, image 8-11) which extends posteriorly into the right temporal lobe. On the T2 sequence, there is a suggestion of a T2 hyperintense mass in or medial to the medial right temporal lobe (series 10, image 6 and 7 and series 14, image 18). Suspect trace subdural fluid along the right temporal horn (series 11, image 9), likely trace subdural hemorrhage. No hydrocephalus. Encephalomalacia in the right occipital lobe. Vascular: Grossly normal arterial flow voids. Skull and upper cervical spine: Normal marrow signal. Sinuses/Orbits: Clear paranasal sinuses. The orbits are unremarkable. Other: Fluid in the bilateral mastoid air cells. IMPRESSION: 1. Evaluation is quite limited by motion artifact. A repeat MRI with and without contrast  is recommended when the patient is better able to tolerate the exam. 2. Possible mass in or medial to the right temporal lobe. Edema in the right temporal lobe may be related to the mass. 3. Mildly increased signal on diffusion-weighted imaging in the left medial temporal lobe, with possible ADC correlate, which could be artifactual or reflect a small acute or subacute infarct. 4. Suspect trace subdural hemorrhage along the right temporal  horn. These results will be called to the ordering clinician or representative by the Radiologist Assistant, and communication documented in the PACS or [employer]. Electronically Signed   By: Merilyn Baba M.D.   On: 11/08/2021 00:40   DG CHEST PORT 1 VIEW  Result Date: 11/07/2021 CLINICAL DATA:  Shortness of breath, CHF EXAM: PORTABLE CHEST 1 VIEW COMPARISON:  Previous studies including the examination done earlier today FINDINGS: Transverse diameter of heart is increased. Central pulmonary vessels are prominent. Increased interstitial markings are seen in parahilar regions and lower lung fields suggesting pulmonary edema. Small bilateral pleural effusions are seen, more so on the right side. Overall, no significant interval changes are noted in comparison with the immediate previous study. IMPRESSION: Cardiomegaly. CHF. Small bilateral pleural effusions, more so on the right side. Electronically Signed   By: Elmer Picker M.D.   On: 11/07/2021 16:18   ECHOCARDIOGRAM COMPLETE  Result Date: 11/07/2021    ECHOCARDIOGRAM REPORT   Patient Name:   [name] Date of Exam: 11/07/2021 Medical Rec #:  [id number]        Height:       69.0 in Accession #:    [id number]       Weight:       224.9 lb Date of Birth:  05-08-1955        BSA:          2.172 m Patient Age:    66 years         BP:           119/94 mmHg Patient Gender: M                HR:           81 bpm. Exam Location:  [facility] Procedure: 2D Echo, Cardiac Doppler and Color Doppler Indications:    Stroke I63.9  History:        Patient has prior history of Echocardiogram examinations, most                 recent 08/31/2020. Cardiomyopathy and CHF, CAD and Previous                 Myocardial Infarction, Stroke; Risk Factors:Hypertension,                 Diabetes, Dyslipidemia and Current Smoker. Acute metabolic                 encephalopathy. Hx of Apical mural thrombus. Cocaine use. S/P                 LAD DES June 2014.  Sonographer:     Alvino Chapel RCS Referring Phys: [id number] ASIA B [place]  Sonographer Comments: Somewhat difficult study due to patient moving around alot and c/o back pain. IMPRESSIONS  1. Left ventricular ejection fraction, by estimation, is 25 to 30%. The left ventricle has severely decreased function. The left ventricle demonstrates regional wall motion abnormalities (LAD and RCA infract pattern). There is moderate asymmetric left ventricular hypertrophy of the septal  segment. Left ventricular diastolic parameters are consistent with Grade III diastolic dysfunction (restrictive). There is the interventricular septum is flattened in diastole ('D' shaped left ventricle), consistent with right ventricular volume overload and the interventricular septum is flattened in systole, consistent with right ventricular pressure overload.There is no apical thrombus on contrast images with aneursysmal apex.  2. Right ventricular systolic function is low normal. The right ventricular size is normal. There is mildly elevated pulmonary artery systolic pressure. The estimated right ventricular systolic pressure is 68.1 mmHg.  3. The mitral valve is abnormal. No evidence of mitral valve regurgitation. No evidence of mitral stenosis.  4. The aortic valve was not well visualized. There is mild calcification of the aortic valve. Aortic valve regurgitation is not visualized. Aortic valve sclerosis is present, with no evidence of aortic valve stenosis.  5. The inferior vena cava is dilated in size with <50% respiratory variability, suggesting right atrial pressure of 15 mmHg. Comparison(s): LVEF worse that prior reporting. FINDINGS  Left Ventricle: Left ventricular ejection fraction, by estimation, is 25 to 30%. The left ventricle has severely decreased function. The left ventricle demonstrates regional wall motion abnormalities. Definity contrast agent was given IV to delineate the left ventricular endocardial borders. The left ventricular  internal cavity size was normal in size. There is moderate asymmetric left ventricular hypertrophy of the septal segment. The interventricular septum is flattened in diastole ('D' shaped left  ventricle), consistent with right ventricular volume overload and the interventricular septum is flattened in systole, consistent with right ventricular pressure overload. Left ventricular diastolic parameters are consistent with Grade III diastolic dysfunction (restrictive).  LV Wall Scoring: The entire apex and mid inferoseptal segment are akinetic. The mid anteroseptal segment, mid inferolateral segment, mid anterolateral segment, mid anterior segment, and mid inferior segment are hypokinetic. Right Ventricle: The right ventricular size is normal. No increase in right ventricular wall thickness. Right ventricular systolic function is low normal. There is mildly elevated pulmonary artery systolic pressure. The tricuspid regurgitant velocity is 2.73 m/s, and with an assumed right atrial pressure of 15 mmHg, the estimated right ventricular systolic pressure is 27.5 mmHg. Left Atrium: Left atrial size was normal in size. Right Atrium: Right atrial size was normal in size. Pericardium: Trivial pericardial effusion is present. Mitral Valve: The mitral valve is abnormal. No evidence of mitral valve regurgitation. No evidence of mitral valve stenosis. Tricuspid Valve: The tricuspid valve is normal in structure. Tricuspid valve regurgitation is trivial. No evidence of tricuspid stenosis. Aortic Valve: The aortic valve was not well visualized. There is mild calcification of the aortic valve. Aortic valve regurgitation is not visualized. Aortic valve sclerosis is present, with no evidence of aortic valve stenosis. Pulmonic Valve: The pulmonic valve was normal in structure. Pulmonic valve regurgitation is trivial. No evidence of pulmonic stenosis. Aorta: The aortic root is normal in size and structure and the ascending aorta was not  well visualized. Venous: The inferior vena cava is dilated in size with less than 50% respiratory variability, suggesting right atrial pressure of 15 mmHg. IAS/Shunts: No atrial level shunt detected by color flow Doppler.  LEFT VENTRICLE PLAX 2D LVIDd:         5.40 cm   Diastology LVIDs:         4.60 cm   LV e' medial:    3.50 cm/s LV PW:         1.10 cm   LV E/e' medial:  25.1 LV IVS:        1.30  cm   LV e' lateral:   3.50 cm/s LVOT diam:     2.00 cm   LV E/e' lateral: 25.1 LV SV:         49 LV SV Index:   23 LVOT Area:     3.14 cm  RIGHT VENTRICLE RV S prime:     9.49 cm/s TAPSE (M-mode): 1.6 cm LEFT ATRIUM             Index        RIGHT ATRIUM           Index LA diam:        4.40 cm 2.03 cm/m   RA Area:     19.90 cm LA Vol (A2C):   59.8 ml 27.54 ml/m  RA Volume:   59.70 ml  27.49 ml/m LA Vol (A4C):   68.7 ml 31.64 ml/m LA Biplane Vol: 63.9 ml 29.43 ml/m  AORTIC VALVE LVOT Vmax:   91.40 cm/s LVOT Vmean:  55.900 cm/s LVOT VTI:    0.157 m  AORTA Ao Root diam: 3.50 cm MITRAL VALVE               TRICUSPID VALVE MV Area (PHT): 5.54 cm    TR Peak grad:   29.8 mmHg MV Decel Time: 137 msec    TR Vmax:        273.00 cm/s MV E velocity: 87.80 cm/s MV A velocity: 37.70 cm/s  SHUNTS MV E/A ratio:  2.33        Systemic VTI:  0.16 m                            Systemic Diam: 2.00 cm Rudean Haskell MD Electronically signed by Rudean Haskell MD Signature Date/Time: 11/07/2021/12:57:22 PM    Final    CT CHEST ABDOMEN PELVIS W CONTRAST  Result Date: 11/07/2021 CLINICAL DATA:  [place], [place] [id number] Trauma [id number]. Low back pain. Respiratory distress. EXAM: CT CHEST, ABDOMEN, AND PELVIS WITH CONTRAST TECHNIQUE: Multidetector CT imaging of the chest, abdomen and pelvis was performed following the standard protocol during bolus administration of intravenous contrast. RADIATION DOSE REDUCTION: This exam was performed according to the departmental dose-optimization program which includes automated exposure  control, adjustment of the mA and/or kV according to patient size and/or use of iterative reconstruction technique. CONTRAST:  136m OMNIPAQUE IOHEXOL 300 MG/ML  SOLN COMPARISON:  05/18/2019 FINDINGS: CT CHEST FINDINGS Cardiovascular: Multi-vessel long 7 coronary artery stenting has been performed. Global cardiac size is mildly enlarged. No pericardial effusion. Central pulmonary arteries are of normal caliber. Mild atherosclerotic calcification within the thoracic aorta. No aortic aneurysm. Mediastinum/Nodes: There is pathologic mediastinal adenopathy identified, new since prior examination with index lymph nodes within the right paratracheal lymph node group measuring up to 21 x 24 mm at axial image # 15/2. Visualized thyroid is unremarkable. Esophagus is unremarkable. Lungs/Pleura: Large right and small left pleural effusions are present. Ground-glass pulmonary infiltrate and smooth interlobular septal thickening is seen within the aerated lungs in keeping with mild to moderate pulmonary edema, possibly cardiogenic in nature. No pneumothorax. No central obstructing lesion. Musculoskeletal: No chest wall mass or suspicious bone lesions identified. CT ABDOMEN PELVIS FINDINGS Hepatobiliary: No focal liver abnormality is seen. No gallstones, gallbladder wall thickening, or biliary dilatation. Pancreas: Unremarkable Spleen: Unremarkable Adrenals/Urinary Tract: Adrenal glands are unremarkable. Kidneys are normal, without renal calculi, focal lesion, or hydronephrosis. Bladder is unremarkable. Stomach/Bowel: Stomach, small bowel, and large bowel  are unremarkable. Appendix not clearly identified and may be absent. Mild ascites is present. No free intraperitoneal gas. Vascular/Lymphatic: Extensive mixed atheromatous plaque is seen within the infrarenal abdominal aorta. Left common, internal, and proximal external iliac arteries are thrombosed. Reconstitution of the left external iliac artery is seen via the inferior  epigastric artery. 2.1 x 3.3 cm fusiform right common iliac artery aneurysm is present demonstrating moderate mural thrombus. No pathologic adenopathy within the abdomen and pelvis. Reproductive: Prostate is unremarkable. Other: Mild diffuse subcutaneous edema within the abdominal wall. No abdominal wall hernia. Musculoskeletal: Osseous structures are age-appropriate. No acute bone abnormality. No lytic or blastic bone lesion. IMPRESSION: 1. No acute intrathoracic or intra-abdominal injury. 2. Pathologic mediastinal adenopathy, new since prior examination, suspicious for a lymphoproliferative process. PET CT examination or short-term follow-up CT examination in 3 months once the patient's acute issues have resolved may be helpful for further evaluation. 3. Mild to moderate pulmonary edema, possibly cardiogenic in nature. Large right and small left pleural effusions. Mild ascites and diffuse subcutaneous edema within the abdominal wall in keeping with mild anasarca. 4. Mild cardiomegaly. Multi-vessel coronary artery stenting. 5. Peripheral vascular disease with occlusion of the left common, internal, and proximal external iliac arteries. Reconstitution of the left external iliac artery via the inferior epigastric artery. 6. 3.3 cm fusiform right common iliac artery aneurysm. Aortic Atherosclerosis (ICD10-I70.0). Electronically Signed   By: Fidela Salisbury M.D.   On: 11/07/2021 04:23   CT Head Wo Contrast  Result Date: 11/07/2021 CLINICAL DATA:  Low back pain; patient is concerned he may have a UTI; increased work of breathing EXAM: CT HEAD WITHOUT CONTRAST CT CERVICAL SPINE WITHOUT CONTRAST TECHNIQUE: Multidetector CT imaging of the head and cervical spine was performed following the standard protocol without intravenous contrast. Multiplanar CT image reconstructions of the cervical spine were also generated. RADIATION DOSE REDUCTION: This exam was performed according to the departmental dose-optimization program  which includes automated exposure control, adjustment of the mA and/or kV according to patient size and/or use of iterative reconstruction technique. COMPARISON:  CT head 05/14/2019, CT cervical spine 10/15/2018 FINDINGS: CT HEAD FINDINGS Brain: Hypodensity in the right temporal lobe, which is new from the prior exam (series 2, images 2-14). The previously noted hypodensity in the right occipital lobe has become more hypodense, consistent with expected evolution of a now remote infarct. No evidence of acute scratch hemorrhage, mass, mass effect, or midline shift. No hydrocephalus or extra-axial fluid collection. Periventricular white matter changes, likely the sequela of chronic small vessel ischemic disease. Vascular: No hyperdense vessel. Skull: Normal. Negative for fracture or focal lesion. Sinuses/Orbits: Minimal mucosal thickening in the maxillary sinuses. The orbits are unremarkable. Other: The mastoid air cells are well aerated. CT CERVICAL SPINE FINDINGS Alignment: Reversal of the normal cervical lordosis.  No listhesis. Skull base and vertebrae: No acute fracture. No primary bone lesion or focal pathologic process. Soft tissues and spinal canal: No prevertebral fluid or swelling. No visible canal hematoma. Disc levels: Multilevel degenerative disc disease with disc height loss C3-C7. No obvious spinal canal stenosis. Upper chest: Please see same-day CT chest Other: None. IMPRESSION: 1. Hypodensity in the right temporal lobe, which is new from the prior exam, concerning for infarct, favored to be subacute. CTA or MRI is recommended for further evaluation. 2. No acute fracture or traumatic listhesis in the cervical spine. These results were called by telephone at the time of interpretation on 11/07/2021 at 4:12 am to provider DAVID [facility] , who verbally  acknowledged these results. Electronically Signed   By: Merilyn Baba M.D.   On: 11/07/2021 04:13   CT Cervical Spine Wo Contrast  Result Date:  11/07/2021 CLINICAL DATA:  Low back pain; patient is concerned he may have a UTI; increased work of breathing EXAM: CT HEAD WITHOUT CONTRAST CT CERVICAL SPINE WITHOUT CONTRAST TECHNIQUE: Multidetector CT imaging of the head and cervical spine was performed following the standard protocol without intravenous contrast. Multiplanar CT image reconstructions of the cervical spine were also generated. RADIATION DOSE REDUCTION: This exam was performed according to the departmental dose-optimization program which includes automated exposure control, adjustment of the mA and/or kV according to patient size and/or use of iterative reconstruction technique. COMPARISON:  CT head 05/14/2019, CT cervical spine 10/15/2018 FINDINGS: CT HEAD FINDINGS Brain: Hypodensity in the right temporal lobe, which is new from the prior exam (series 2, images 2-14). The previously noted hypodensity in the right occipital lobe has become more hypodense, consistent with expected evolution of a now remote infarct. No evidence of acute scratch hemorrhage, mass, mass effect, or midline shift. No hydrocephalus or extra-axial fluid collection. Periventricular white matter changes, likely the sequela of chronic small vessel ischemic disease. Vascular: No hyperdense vessel. Skull: Normal. Negative for fracture or focal lesion. Sinuses/Orbits: Minimal mucosal thickening in the maxillary sinuses. The orbits are unremarkable. Other: The mastoid air cells are well aerated. CT CERVICAL SPINE FINDINGS Alignment: Reversal of the normal cervical lordosis.  No listhesis. Skull base and vertebrae: No acute fracture. No primary bone lesion or focal pathologic process. Soft tissues and spinal canal: No prevertebral fluid or swelling. No visible canal hematoma. Disc levels: Multilevel degenerative disc disease with disc height loss C3-C7. No obvious spinal canal stenosis. Upper chest: Please see same-day CT chest Other: None. IMPRESSION: 1. Hypodensity in the right  temporal lobe, which is new from the prior exam, concerning for infarct, favored to be subacute. CTA or MRI is recommended for further evaluation. 2. No acute fracture or traumatic listhesis in the cervical spine. These results were called by telephone at the time of interpretation on 11/07/2021 at 4:12 am to provider DAVID [facility] , who verbally acknowledged these results. Electronically Signed   By: Merilyn Baba M.D.   On: 11/07/2021 04:13   DG Chest Port 1 View  Result Date: 11/07/2021 CLINICAL DATA:  Shortness of breath. EXAM: PORTABLE CHEST 1 VIEW COMPARISON:  Chest radiograph dated 05/14/2019. FINDINGS: Cardiomegaly with vascular congestion and edema. Small right pleural effusion and right lung base atelectasis. Pneumonia is not excluded. No pneumothorax. No acute osseous pathology. Degenerative changes of the spine. IMPRESSION: 1. Cardiomegaly with vascular congestion and edema. 2. Small right pleural effusion. Electronically Signed   By: Anner Crete M.D.   On: 11/07/2021 01:22    Medications: Prior to Admission:  Medications Prior to Admission  Medication Sig Dispense Refill Last Dose   gabapentin (NEURONTIN) 300 MG capsule Take 1 capsule (300 mg total) by mouth 3 (three) times daily. 90 capsule 0 11/06/2021   sildenafil (VIAGRA) 50 MG tablet Take 1 tablet (50 mg total) by mouth daily as needed for erectile dysfunction. 15 tablet 2 unk   tadalafil (CIALIS) 20 MG tablet Take 20 mg by mouth daily as needed.   unk   atorvastatin (LIPITOR) 40 MG tablet Take 40 mg by mouth at bedtime. (Patient not taking: Reported on 11/07/2021)   Not Taking   ciprofloxacin (CIPRO) 500 MG tablet Take 500 mg by mouth 2 (two) times daily. (Patient not  taking: Reported on 11/07/2021)   Not Taking   insulin glargine-yfgn (SEMGLEE) 100 UNIT/ML injection Inject 20 Units into the skin See admin instructions. Inject 20 units under skin at bedtime for diabetes mellitus do not mix with other insulins in the same  syringe-discard bottle 28 days after opening **replaces Lantus** (Patient not taking: Reported on 11/07/2021)   Not Taking   JANUMET 50-500 MG tablet Take 1 tablet by mouth 2 (two) times daily. (Patient not taking: Reported on 11/07/2021)   Not Taking   meloxicam (MOBIC) 15 MG tablet Take 15 mg by mouth daily as needed. (Patient not taking: Reported on 11/07/2021)   Not Taking   metFORMIN (GLUCOPHAGE) 500 MG tablet TAKE ONE TABLET BY MOUTH TWO TIMES A DAY FOR DIABETES (Patient not taking: Reported on 11/07/2021)   Not Taking   sertraline (ZOLOFT) 50 MG tablet Take 50 mg by mouth at bedtime. (Patient not taking: Reported on 11/07/2021)   Not Taking   traMADol (ULTRAM) 50 MG tablet Take 1 tablet (50 mg total) by mouth every 6 (six) hours as needed. (Patient not taking: Reported on 11/07/2021) 20 tablet 0 Not Taking   Scheduled:  carvedilol  3.125 mg Oral BID WC   feeding supplement  237 mL Oral BID BM   furosemide  40 mg Intravenous Q12H   insulin aspart  0-15 Units Subcutaneous TID WC   insulin aspart  0-5 Units Subcutaneous QHS   nicotine  21 mg Transdermal Daily   thiamine (VITAMIN B1) injection  100 mg Intravenous Daily   Or   thiamine  100 mg Oral Daily   Continuous:  sodium chloride 50 mL/hr at 11/08/21 1517   acyclovir (ZOVIRAX) 900 mg in dextrose 5 % 250 mL IVPB     Assessment: 66 y.o. male with PMH significant for with PMH significant for Coronary artery disease, chronic lower back pain, CKD, cocaine abuse, diabetes mellitus type 2, ischemic cardiomyopathy, sleep apnea, HLD, HTN who presents with back and genital pain and shortness of breath. Noted to be acting inappropriately and has been uncooperative with some exams. Brain imaging reveals probable right temporal lobe mass.  - MRI brain without contrast reveals probable right anterior temporal lobe mass, intra-axial versus extra-axial, with extensive right temporal lobe parenchymal T2 hyperintensity, suggestive of tumor-related edema.   - Patient continues to decline to provide much history and is adamantly refusing any further imaging, stating that he wants to go home and will "think about it" when it is recommended that he be seen in close follow up. He is fully oriented and able to follow all instructions during exam, as well as provide a basic history and therefore is most likely to have capacity to make his own medical decisions. If primary team has doubts regarding his capacity, may need Psychiatry consult to formally assess for capacity.    - Although HSV encephalitis is a differential diagnostic consideration, the temporal lobe findings on MRI are highly atypical for such, he has no fever, does not complain of a headache, and white count is only mildly elevated at 11.  - An EEG would be helpful, but unclear if patient would be compliant.  - UDS was negative. Ammonia level normal.  - TTE: Left ventricular ejection fraction, by estimation, is 25 to 30%. The left ventricle has severely decreased function. The left ventricle demonstrates regional wall motion abnormalities (LAD and RCA infract pattern). There is moderate asymmetric left ventricular hypertrophy of the septal segment. Left ventricular diastolic parameters are consistent  with Grade III diastolic dysfunction (restrictive). - CT chest reveals pathologic mediastinal adenopathy, new since prior examination, suspicious for a lymphoproliferative process.     Recommendations  - Repeat MRI brain with/without contrast to further characterize the right temporal lobe lesion.  - If repeat MRI brain confirms a mass, may need Neurosurgery formal consult for consideration of biopsy. Neurosurgery has placed a brief opinion in the chart regarding the non-contrast MRI already completed - No antiplatelet or anticoagulation until hemorrhage rule out on repeat imaging. Hold off on DVT chemoprophylaxis. DVT prophylaxis with SCDs is recommended.  - Can continue acyclovir until repeat MRI  brain, unless ID agrees with Neurology that overall presentation does not militate in favor of HSV encephalitis. Doubt that patient would consent to LP.  - Vit B1 levels are pending. Continue thiamine 100 mg daily - Routine EEG has been ordered  Addendum: EEG reveals intermittent generalized slowing. The findings are suggestive of mild diffuse encephalopathy, nonspecific to etiology. No seizures or epileptiform discharges were seen throughout the recording.   LOS: 1 day   '@Electronically'$  signed: Dr. Kerney Elbe 11/08/2021  10:07 AM

## 2021-11-08 NOTE — Progress Notes (Signed)
EEG complete - results pending 

## 2021-11-08 NOTE — Significant Event (Signed)
Rapid Response Event Note   Reason for Call : CP Initial Focused Assessment:  Pt c/o 10/10 CP starting in his back and coming around to his mid chest/epigastric area. No distress. Equal palpable radial pulses. Skin warm, pink and dry. EKG shows SR with 1st degree block. No STE. NTG SL given. Dr. Marlowe Sax notified per primary RN.  0300-97.52F, HR 67, 131/90, RR 24 with sats 94% on 3L [place].    Interventions:  -EKG -SL NTG q72mns x 3 if needed -Additional pain meds    MD Notified: Dr. RMarlowe SaxCall Time: 0236 Arrival Time: 00352End Time: 0305  RMadelynn Done RN

## 2021-11-08 NOTE — Progress Notes (Signed)
Overnight progress note  MRI of brain without contrast showing: IMPRESSION: 1. Evaluation is quite limited by motion artifact. A repeat MRI with and without contrast is recommended when the patient is better able to tolerate the exam. 2. Possible mass in or medial to the right temporal lobe. Edema in the right temporal lobe may be related to the mass. 3. Mildly increased signal on diffusion-weighted imaging in the left medial temporal lobe, with possible ADC correlate, which could be artifactual or reflect a small acute or subacute infarct. 4. Suspect trace subdural hemorrhage along the right temporal horn.  Spoke to RN, no acute change in neurologic assessment.  -Discussed case with neurology, recommended repeating brain MRI.  Case was also discussed with on-call physician for neurosurgery. -Repeat MRI ordered -Ativan prior to MRI -Discontinued subcutaneous heparin  Chest pain Notified by RN that patient complained of 10 out of 10 intensity substernal chest pain and was given a dose of nitroglycerin.  Still endorsing 8 out of 10 intensity pain.  Vital signs: Temperature 97.9 F, heart rate 64, blood pressure 134/92, respiratory rate 20-24, SPO2 95% on 2 L O2 (stable).  EKG done and showing sinus rhythm with first-degree AV block with PVCs; no ST elevations.  LHC done in March 2021 showing diffuse distal LAD disease not amenable to PCI.  Diffuse circumflex, OM disease unchanged from prior.  He underwent PCI of the PDA and the distal RCA at that time.  -Continue cardiac monitoring -Stat troponin x2 -Morphine 1 mg x 1 ordered for pain -Continue to monitor very closely

## 2021-11-08 NOTE — Procedures (Signed)
Patient Name: [name]  MRN: [id number]  Epilepsy Attending: Lora Havens  Referring Physician/Provider: Kerney Elbe, MD  Date: 11/08/2021 Duration: 22.21 mins  Patient history: 66yo M with ams. EEG to evaluate for seizure  Level of alertness: Awake  AEDs during EEG study: None  Technical aspects: This EEG study was done with scalp electrodes positioned according to the 10-20 International system of electrode placement. Electrical activity was reviewed with band pass filter of 1-'70Hz'$ , sensitivity of 7 uV/mm, display speed of 19m/sec with a '60Hz'$  notched filter applied as appropriate. EEG data were recorded continuously and digitally stored.  Video monitoring was available and reviewed as appropriate.  Description: The posterior dominant rhythm consists of 7.5 Hz activity of moderate voltage (25-35 uV) seen predominantly in posterior head regions, symmetric and reactive to eye opening and eye closing. EEG showed intermittent generalized 3 to 6 Hz theta-delta slowing. Hyperventilation and photic stimulation were not performed.     ABNORMALITY - Intermittent slow, generalized  IMPRESSION: This study is suggestive of mild diffuse encephalopathy, nonspecific etiology. No seizures or epileptiform discharges were seen throughout the recording.  Nao Linz OBarbra Sarks

## 2021-11-08 NOTE — Progress Notes (Signed)
Palliative-   Consult received and chart reviewed.   Palliative consult discussed with attending MD. Patient is refusing interventions and possibly leaving AMA, not likely to cooperate with Palliative consult.   Will d/c consult for now. Please feel free to re-consult if needed.   Mariana Kaufman, AGNP-C Palliative Medicine  No charge

## 2021-11-08 NOTE — Progress Notes (Addendum)
Pharmacy Antimicrobial Note  TAYSOM GLYMPH is a 66 y.o. 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  Pharmacy has been consulted for acyclovir dosing.  Plan: Acyclovir 1000 mg IV x 1, then 900 mg IV q8h  Height: '5\' 9"'$  (175.3 cm) Weight: 102 kg (224 lb 13.9 oz) IBW/kg (Calculated) : 70.7 ABW: 83 kg  Temp (24hrs), Avg:97.8 F (36.6 C), Min:97.4 F (36.3 C), Max:98.1 F (36.7 C)  Recent Labs  Lab 11/07/21 0141  WBC 11.0*  CREATININE 1.57*    Estimated Creatinine Clearance: 53.7 mL/min (A) (by C-G formula based on SCr of 1.57 mg/dL (H)).    Allergies  Allergen Reactions   Clopidogrel     Other reaction(s): Drowsy, Skin irritation   Microbiology results: 10/29 BCx: Negative 10/29 UCx: IP   Thank you for allowing pharmacy to be a part of this patient's care.  Lerry Liner, PharmD Candidate class of 2024 11/08/2021 5:26 AM

## 2021-11-09 ENCOUNTER — Other Ambulatory Visit: Payer: Self-pay

## 2021-11-09 DIAGNOSIS — R9389 Abnormal findings on diagnostic imaging of other specified body structures: Secondary | ICD-10-CM | POA: Diagnosis not present

## 2021-11-09 DIAGNOSIS — R590 Localized enlarged lymph nodes: Secondary | ICD-10-CM | POA: Diagnosis present

## 2021-11-09 DIAGNOSIS — J9601 Acute respiratory failure with hypoxia: Secondary | ICD-10-CM | POA: Diagnosis not present

## 2021-11-09 LAB — GLUCOSE, CAPILLARY
Glucose-Capillary: 157 mg/dL — ABNORMAL HIGH (ref 70–99)
Glucose-Capillary: 136 mg/dL — ABNORMAL HIGH (ref 70–99)
Glucose-Capillary: 191 mg/dL — ABNORMAL HIGH (ref 70–99)
Glucose-Capillary: 128 mg/dL — ABNORMAL HIGH (ref 70–99)

## 2021-11-09 LAB — PSA: Prostatic Specific Antigen: 1.53 ng/mL (ref 0.00–4.00)

## 2021-11-09 MED ORDER — METOPROLOL TARTRATE 5 MG/5ML IV SOLN: 5.0000 mg | INTRAVENOUS | Status: DC | PRN

## 2021-11-09 MED ORDER — SENNOSIDES-DOCUSATE SODIUM 8.6-50 MG PO TABS: 1.0000 | ORAL_TABLET | Freq: Every evening | ORAL | Status: DC | PRN

## 2021-11-09 MED ORDER — IPRATROPIUM-ALBUTEROL 0.5-2.5 (3) MG/3ML IN SOLN: 3.0000 mL | RESPIRATORY_TRACT | Status: DC | PRN

## 2021-11-09 MED ORDER — TRAZODONE HCL 50 MG PO TABS
50.0000 mg | ORAL_TABLET | Freq: Every evening | ORAL | Status: DC | PRN
  Administered 2021-11-09 – 2021-11-12 (×4): 50 mg via ORAL
  Filled 2021-11-09 (×4): qty 1

## 2021-11-09 MED ORDER — ACETAMINOPHEN 325 MG PO TABS: 650.0000 mg | ORAL_TABLET | Freq: Four times a day (QID) | ORAL | Status: DC | PRN

## 2021-11-09 MED ORDER — HYDRALAZINE HCL 20 MG/ML IJ SOLN: 10.0000 mg | INTRAMUSCULAR | Status: DC | PRN

## 2021-11-09 MED ORDER — ONDANSETRON HCL 4 MG/2ML IJ SOLN: 4.0000 mg | Freq: Four times a day (QID) | INTRAMUSCULAR | Status: DC | PRN

## 2021-11-09 MED ORDER — GUAIFENESIN 100 MG/5ML PO LIQD: 5.0000 mL | ORAL | Status: DC | PRN

## 2021-11-09 NOTE — Evaluation (Signed)
Speech Language Pathology Evaluation Patient Details Name: [name] MRN: [id number] DOB: [birth year] Today's Date: 11/09/2021 Time: 8502-7741 SLP Time Calculation (min) (ACUTE ONLY): 20 min  Problem List:  Patient Active Problem List   Diagnosis Date Noted   Acute respiratory failure with hypoxia ([place]) 11/07/2021   Stroke (cerebrum) ([place]) 11/07/2021   Elevated troponin 28/78/6767   Acute metabolic encephalopathy 20/94/7096   Protein calorie malnutrition ([place]) 11/07/2021   Cardiac volume overload 28/36/6294   Acute systolic heart failure (HCC)    Scrotal pain; chronic, bilateral 07/05/2021   Microscopic hematuria 07/05/2021   Organic impotence 07/05/2021   Necrotizing fasciitis ([place]) 05/22/2019   Opiate overdose ([place])    Uncontrolled type 2 diabetes mellitus with hyperglycemia ([place]) 05/16/2019   Elevated lactic acid level    Acute renal failure superimposed on stage 3b chronic kidney disease ([place]) 05/15/2019   Cocaine abuse ([place]) 05/15/2019   Severe sepsis ([place]) 05/14/2019   Unstable angina ([place]) 10/16/2018   Apical mural thrombus 10/15/2018   Claudication in peripheral vascular disease ([place]) 11/30/2017   Peripheral arterial disease ([place]) 11/28/2017   Chest pain 11/03/2016   Constipation 11/03/2016   CKD (chronic kidney disease), stage II 08/02/2016   Cellulitis of right hand 08/02/2016   Chronic pain 08/02/2016   AKI (acute kidney injury) ([place]) 01/31/2016   Lobar pneumonia ([place]) 01/31/2016   Influenza A 01/31/2016   Diarrhea 01/30/2016   NSVT (nonsustained ventricular tachycardia) (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 prior LAD stenting. b. s/p DES to [facility] 08/2015. c. 04/2016 Cardiac cath at [facility]. Patent stent in the PLAD and RCA. Diffuse dLAD, OM2, and  RPDA disease. d. DES to PDA and distal RCA 03/2019    Chronic lower back pain    CKD (chronic kidney disease), stage II    Cocaine abuse ([place])    DM2 (diabetes mellitus, type 2) (HCC)    Essential hypertension    GERD (gastroesophageal reflux disease)    Headache    History of pneumonia    Hyperlipidemia    Ischemic cardiomyopathy    LV (left ventricular) mural thrombus    Sleep apnea    Past Surgical History:  Past Surgical History:  Procedure Laterality Date   APPENDECTOMY     CARDIAC CATHETERIZATION N/A 09/07/2015   Procedure: Left Heart Cath and Coronary Angiography;  Surgeon: Leonie Man, MD;  Location: [place] [facility];  Service: Cardiovascular;  Laterality: N/A;   CARDIAC CATHETERIZATION  N/A 09/07/2015   Procedure: Coronary Stent Intervention;  Surgeon: Leonie Man, MD;  Location: [place] [facility];  Service: Cardiovascular;  Laterality: N/A;   CORONARY ANGIOGRAM  09/07/13   residual RCA and OM disease   CORONARY ANGIOPLASTY WITH STENT PLACEMENT     CORONARY STENT INTERVENTION N/A 10/16/2018   Procedure: CORONARY STENT INTERVENTION;   Surgeon: Burnell Blanks, MD;  Location: [place] [facility];  Service: Cardiovascular;  Laterality: N/A;   CORONARY STENT INTERVENTION N/A 03/11/2019   Procedure: CORONARY STENT INTERVENTION;  Surgeon: [name], MD;  Location: [place] [facility];  Service: Cardiovascular;  Laterality: N/A;   FRACTURE SURGERY     INCISION AND DRAINAGE OF WOUND Left 05/19/2019   Procedure: DEBRIDEMENT LEFT GROIN;  Surgeon: Georganna Skeans, MD;  Location: [place];  Service: General;  Laterality: Left;   INSERTION OF ILIAC STENT Left 11/30/2017   Left external illiac stent   INSERTION OF ILIAC STENT  11/30/2017   Procedure: Insertion Of Iliac Stent;  Surgeon: Lorretta Harp, MD;  Location: [place] [facility];  Service: Cardiovascular;;  Left external illiac stent   KNEE ARTHROSCOPY Left    KNEE SURGERY     "ligaments, cartilage; tendon, put a pin in" (11/30/2017)   LEFT HEART CATH Bilateral 07/08/2012   Procedure: LEFT HEART CATH;  Surgeon: [name], MD;  Location: [facility] CATH LAB;  Service: Cardiovascular;  Laterality: Bilateral;   LEFT HEART CATH AND CORONARY ANGIOGRAPHY N/A 10/16/2018   Procedure: LEFT HEART CATH AND CORONARY ANGIOGRAPHY;  Surgeon: Burnell Blanks, MD;  Location: [place] [facility];  Service: Cardiovascular;  Laterality: N/A;   LEFT HEART CATH AND CORONARY ANGIOGRAPHY N/A 03/11/2019   Procedure: LEFT HEART CATH AND CORONARY ANGIOGRAPHY;  Surgeon: [name], MD;  Location: [place] [facility];  Service: Cardiovascular;  Laterality: N/A;   LEFT HEART CATHETERIZATION WITH CORONARY ANGIOGRAM N/A 09/06/2013   STEMI, 2nd ISR LAD. Procedure: LEFT HEART CATHETERIZATION WITH CORONARY ANGIOGRAM;  Surgeon: [name], MD;  Location: [facility] CATH LAB;  Service: Cardiovascular;  Laterality: N/A;   LOWER EXTREMITY ANGIOGRAPHY N/A 11/30/2017   Procedure: LOWER EXTREMITY ANGIOGRAPHY;  Surgeon: Lorretta Harp, MD;  Location: [place] [facility];  Service:  Cardiovascular;  Laterality: N/A;   PERCUTANEOUS CORONARY STENT INTERVENTION (PCI-S)  07/08/2012   Procedure: PERCUTANEOUS CORONARY STENT INTERVENTION (PCI-S);  Surgeon: [name], MD;  Location: [facility] CATH LAB;  Service: Cardiovascular;;  DES LAD   PERCUTANEOUS CORONARY STENT INTERVENTION (PCI-S) N/A 09/06/2013   Procedure: PERCUTANEOUS CORONARY STENT INTERVENTION (PCI-S);  Surgeon: [name], MD;  Location: [facility] CATH LAB;  Service: Cardiovascular;  Laterality: N/A;  Mid LAD 3.0/24mm Promus   WOUND DEBRIDEMENT Left 05/20/2019   Procedure: DEBRIDEMENT GROIN;  Surgeon: Kinsinger, Arta Bruce, MD;  Location: [place];  Service: General;  Laterality: Left;   WRIST FRACTURE SURGERY Bilateral    HPI:  Patient is a 66 y.o. male with PMH: CAD, CKD, cocaine abuse, DM-2, ischemic cardiomyopathy, sleep apnea, GERD, HLD. He presented to the hospital on 11/07/21 with c/o of hurting his back and swelling of his testicles. He reported that he had his his head but denied losing consciousness. Patient was having hallucinations and displaying inappropriate behavior towards staff. CDT showed hypodensity in right temporal lobe and MRI showed possible mass right temporal lobe, suspected trace SDH right temporal horn and possible small acute or subacute infarct in temporal lobe.   Assessment / Plan /  Recommendation Clinical Impression  Patient presenting with moderately impaired cognition as per this evaluation. He was oriented to time, place, situation but affect was somewhat odd. He told SLP that he has been on disability since he was 66 years old and he lives by himself. He said that his daughter handles his finances such as paying bills but then later said that she is in [place]. He participated in completing the SLUMS examination and his score of 15 out of 30 places him well below normal range (25-30). He exhibited deficits in areas of memory, attention, awareness, problem solving and would benefit from SLP  intervention during his acute care stay. No family or friends present to determine what his cognitive baseline is. He will likely benefit from supervision and assistance upon discharge back home.    SLP Assessment  SLP Recommendation/Assessment: Patient needs continued Speech Lanaguage Pathology Services    Recommendations for follow up therapy are one component of a multi-disciplinary discharge planning process, led by the attending physician.  Recommendations may be updated based on patient status, additional functional criteria and insurance authorization.    Follow Up Recommendations  Follow physician's recommendations for discharge plan and follow up therapies    Assistance Recommended at Discharge  Intermittent Supervision/Assistance  Functional Status Assessment Patient has had a recent decline in their functional status and demonstrates the ability to make significant improvements in function in a reasonable and predictable amount of time.  Frequency and Duration min 1 x/week  1 week      SLP Evaluation Cognition  Overall Cognitive Status: Difficult to assess Orientation Level: Oriented X4 Year: 2023 Month: October Day of Week: Correct Attention: Sustained Sustained Attention: Appears intact Memory: Impaired Memory Impairment: Retrieval deficit Awareness: Impaired Awareness Impairment: Emergent impairment Problem Solving: Impaired Problem Solving Impairment: Verbal complex Executive Function: Self Monitoring Self Monitoring: Impaired Self Monitoring Impairment: Verbal complex Safety/Judgment: Impaired       Comprehension  Auditory Comprehension Overall Auditory Comprehension: Appears within functional limits for tasks assessed    Expression Expression Primary Mode of Expression: Verbal Verbal Expression Overall Verbal Expression: Appears within functional limits for tasks assessed Initiation: No impairment Repetition: No impairment Naming: No  impairment Written Expression Dominant Hand: Right   Oral / Motor  Oral Motor/Sensory Function Overall Oral Motor/Sensory Function: Within functional limits Motor Speech Overall Motor Speech: Appears within functional limits for tasks assessed Respiration: Within functional limits Resonance: Within functional limits Articulation: Within functional limitis Intelligibility: Intelligible Motor Planning: Witnin functional limits           Sonia Baller, MA, CCC-SLP Speech Therapy
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
## 2021-11-09 NOTE — Progress Notes (Signed)
PROGRESS NOTE    [name]  YBO:[id number] DOB: Jun 07, 1955 DOA: 11/07/2021 PCP: Carrolyn Meiers, MD   Brief Narrative:   66 y.o. male with medical history significant of Coronary artery disease, chronic lower back pain, CKD, cocaine abuse, diabetes mellitus type 2, ischemic cardiomyopathy, sleep apnea, and more presents the ED with a chief complaint of hurting in his back and pain and swelling of his testicles. Patient also reports that he fell yesterday.  He did hit his head.  He did not lose consciousness.  He reports that he was walking along and his legs just buckled underneath him.  He did not notice asymmetric weakness.  Patient ended up getting transferred to [facility] for MRI as CT suggested possible right temporal lobe hypodensity.  Eventually MRI of the brain with contrast suggested extra-axial mass concerning for meningioma versus dural metastases.  CT chest abdomen pelvis had shown pleural effusion, ascites and mediastinal lymphadenopathy therefore pulmonary consulted for their input.  Assessment & Plan:  Principal Problem:   Acute respiratory failure with hypoxia (HCC) Active Problems:   Tobacco use disorder   Acute on chronic systolic CHF (congestive heart failure) (HCC)   Claudication in peripheral vascular disease (HCC)   Acute renal failure superimposed on stage 3b chronic kidney disease ([place])   Uncontrolled type 2 diabetes mellitus with hyperglycemia (HCC)   Stroke (cerebrum) (HCC)   Elevated troponin   Acute metabolic encephalopathy   Protein calorie malnutrition (HCC)   Cardiac volume overload   Acute metabolic encephalopathy Extra-axial temporal lobe mass concerns of meningioma versus dural metastases -Off-and-on having hallucination and inappropriate comments towards staff.  CT of the head showed mass along with MRI brain without contrast which was eventually confirmed on MRI brain with contrast.  Although radiology read is intra-axial mass,  neurosurgery/neurology thinks is extra-axial concerning for meningioma versus dural metastases.  CT of the chest abdomen pelvis shows mediastinal LN, pleural effusions and ascites.  I have discussed with pulmonary who will review the scans and determine if we are able to obtain samples otherwise we will have to get IR involved. -TSH, B12 and folate are normal No masslike effect therefore holding off on steroids, wonder if patient will need seizure prophylaxis?   Acute respiratory failure with hypoxia (HCC) - Likely from CHF exacerbation   Protein calorie malnutrition ([place]), adult - Encourage p.o. intake   Acute on chronic systolic CHF (congestive heart failure) ([place]), EF 30%, grade 3 DD, class III -Repeat echo shows persistent depressed EF, EF 30% which is similar to previous EF.  Grade 3 diastolic dysfunction with signs of volume overload.  Lasix 40 mg IV twice daily, monitor urine output    Elevated troponin -Likely demand.  Troponins remain flat   Stroke (cerebrum) (HCC) -Suspicion for temporal lobe mass.  Attempt to get MRI   Uncontrolled type 2 diabetes mellitus with hyperglycemia (HCC) -Check A1c, lipid panel.  Sliding scale and Accu-Cheks   stage 3b chronic kidney disease ([place]) -Patient does not have acute kidney injury.  Creatinine is at baseline of 1.5   Claudication in peripheral vascular disease (HCC) 3.3 cm fusiform right common iliac artery aneurysm -CT abdomen pelvis shows peripheral vascular disease with occlusion of left common internal and proximal external iliac with reconstitution of left external iliac artery via inferior epigastric artery.  Follow-up outpatient vascular    Tobacco use disorder -Nicotine patch  Unfortunately patient is very noncompliant and frequently refusing any test including blood work  DVT prophylaxis: SCDs Start: 11/07/21 8127 Code Status: Full Family Communication:    Ongoing work-up for malignancy, altered mental status and brain  mass  Subjective: Feels ok no complaints.    Examination:  Constitutional: Not in acute distress Respiratory: Clear to auscultation bilaterally Cardiovascular: Normal sinus rhythm, no rubs Abdomen: Nontender nondistended good bowel sounds Musculoskeletal: No edema noted Skin: No rashes seen Neurologic: CN 2-12 grossly intact.  And nonfocal Psychiatric: Normal judgment and insight. Alert and oriented x 3. Normal mood.   Objective: Vitals:   11/08/21 2000 11/08/21 2357 11/09/21 0442 11/09/21 0805  BP: (!) 117/90 111/82  (!) 136/97  Pulse: 69   82  Resp:  20  18  Temp: (!) 97.5 F (36.4 C) 97.9 F (36.6 C)  97.9 F (36.6 C)  TempSrc:  Oral  Oral  SpO2: 92% 97%    Weight:   102 kg   Height:        Intake/Output Summary (Last 24 hours) at 11/09/2021 0823 Last data filed at 11/09/2021 0700 Gross per 24 hour  Intake 1495.95 ml  Output 1700 ml  Net -204.05 ml   Filed Weights   11/07/21 0049 11/09/21 0442  Weight: 102 kg 102 kg     Data Reviewed:   CBC: Recent Labs  Lab 11/07/21 0141  WBC 11.0*  HGB 11.8*  HCT 38.6*  MCV 81.4  PLT 517   Basic Metabolic Panel: Recent Labs  Lab 11/07/21 0141  NA 139  K 3.7  CL 107  CO2 25  GLUCOSE 208*  BUN 18  CREATININE 1.57*  CALCIUM 8.1*   GFR: Estimated Creatinine Clearance: 53.7 mL/min (A) (by C-G formula based on SCr of 1.57 mg/dL (H)). Liver Function Tests: Recent Labs  Lab 11/07/21 0141  AST 25  ALT 24  ALKPHOS 30*  BILITOT 0.6  PROT 7.5  ALBUMIN 2.7*   Recent Labs  Lab 11/07/21 0141  LIPASE 24   Recent Labs  Lab 11/08/21 0556  AMMONIA 19   Coagulation Profile: No results for input(s): "INR", "PROTIME" in the last 168 hours. Cardiac Enzymes: No results for input(s): "CKTOTAL", "CKMB", "CKMBINDEX", "TROPONINI" in the last 168 hours. BNP (last 3 results) No results for input(s): "PROBNP" in the last 8760 hours. HbA1C: No results for input(s): "HGBA1C" in the last 72 hours. CBG: Recent  Labs  Lab 11/08/21 0815 11/08/21 1146 11/08/21 1611 11/08/21 2126 11/09/21 0805  GLUCAP 177* 117* 104* 222* 157*   Lipid Profile: No results for input(s): "CHOL", "HDL", "LDLCALC", "TRIG", "CHOLHDL", "LDLDIRECT" in the last 72 hours. Thyroid Function Tests: Recent Labs    11/07/21 0821  TSH 2.489   Anemia Panel: Recent Labs    11/07/21 0821  VITAMINB12 793  FOLATE 6.5   Sepsis Labs: No results for input(s): "PROCALCITON", "LATICACIDVEN" in the last 168 hours.  Recent Results (from the past 240 hour(s))  Culture, blood (Routine X 2) w Reflex to ID Panel     Status: None (Preliminary result)   Collection Time: 11/07/21  1:30 AM   Specimen: BLOOD RIGHT FOREARM  Result Value Ref Range Status   Specimen Description   Final    BLOOD RIGHT FOREARM BOTTLES DRAWN AEROBIC AND ANAEROBIC   Special Requests Blood Culture adequate volume  Final   Culture   Final    NO GROWTH 2 DAYS Performed at [facility], [address], [place], [place] [address]    Report Status PENDING  Incomplete  Culture, blood (Routine X 2) w Reflex  to ID Panel     Status: None (Preliminary result)   Collection Time: 11/07/21  1:41 AM   Specimen: Site Not Specified; Blood  Result Value Ref Range Status   Specimen Description   Final    SITE NOT SPECIFIED BOTTLES DRAWN AEROBIC AND ANAEROBIC   Special Requests Blood Culture adequate volume  Final   Culture   Final    NO GROWTH 2 DAYS Performed at [facility], [address] [place], [place] [address]    Report Status PENDING  Incomplete  Urine Culture     Status: None   Collection Time: 11/07/21  8:11 AM   Specimen: Urine, Clean Catch  Result Value Ref Range Status   Specimen Description   Final    URINE, CLEAN CATCH Performed at [facility], [address] [place], [place] [address]    Special Requests   Final    NONE Performed at [facility], [address], [place], [place] [address]    Culture   Final    NO GROWTH Performed at [facility], [place] [address], [place], [place] [address]    Report Status 11/08/2021 FINAL  Final         Radiology Studies: MR BRAIN W WO CONTRAST  Result Date: 11/09/2021 CLINICAL DATA:  Initial evaluation for neuro deficit, stroke suspected. EXAM: MRI HEAD WITHOUT AND WITH CONTRAST TECHNIQUE: Multiplanar, multiecho pulse sequences of the brain and surrounding structures were obtained without and with intravenous contrast. CONTRAST:  89m GADAVIST GADOBUTROL 1 MMOL/ML IV SOLN COMPARISON:  Prior brain MRI from earlier the same day. FINDINGS: Brain: Examination severely degraded by motion artifact, limiting assessment. Age-related cerebral atrophy with chronic microvascular ischemic disease. Chronic encephalomalacia with hemosiderin staining at the right occipital lobe, consistent with a chronic right PCA distribution infarct. No evidence for acute or recent infarction. No visible acute intracranial hemorrhage. Few chronic micro hemorrhages noted about the pons and left lentiform nucleus, likely hypertensive in nature. There is an enhancing mass involving the medial aspect of the anterior right temporal horn measuring 2.1 x 2.0 x 1.8 cm (AP by transverse by craniocaudad). Although somewhat difficult to be certain given motion artifact on this exam, this is favored to be intra-axial in location. Small amount of associated susceptibility artifact suggestive of blood products and/or necrosis. T2/FLAIR intensity within the adjacent right temporal lobe consistent with vasogenic edema and/or infiltrating nonenhancing tumor. No significant midline shift. Adjacent basilar cisterns remain patent. No other visible mass lesion or abnormal enhancement. No hydrocephalus. No convincing extra-axial fluid collection seen on this exam. Vascular: Major intracranial vascular flow voids are grossly maintained. Skull and upper cervical spine: Craniocervical junction within normal limits. Bone marrow signal intensity grossly  normal. No visible focal marrow replacing lesion. No scalp soft tissue abnormality. Sinuses/Orbits: Globes orbital soft tissues within normal limits. Mild scattered mucosal thickening noted about the ethmoidal air cells and maxillary sinuses. Small to moderate bilateral mastoid effusions. Visualized nasopharynx grossly negative. Other: None. IMPRESSION: 1. Motion degraded exam. 2. 2.1 x 2.0 x 1.8 cm enhancing mass positioned at the medial right temporal horn. This is favored to be intra-axial in location, with primary differential considerations including a solitary intracranial metastasis versus a primary CNS neoplasm. Associated vasogenic edema and/or infiltrating nonenhancing tumor within the adjacent right temporal lobe. 3. No other acute intracranial abnormality. 4. Underlying atrophy with chronic microvascular ischemic disease, with a small chronic right PCA distribution infarct. Electronically Signed   By: BPincus BadderD.  On: 11/09/2021 05:27   EEG adult  Result Date: 11/08/2021 Lora Havens, MD     11/08/2021  1:46 PM Patient Name: [name] MRN: [id number] Epilepsy Attending: Lora Havens Referring Physician/Provider: Kerney Elbe, MD Date: 11/08/2021 Duration: 22.21 mins Patient history: 66yo M with ams. EEG to evaluate for seizure Level of alertness: Awake AEDs during EEG study: None Technical aspects: This EEG study was done with scalp electrodes positioned according to the 10-20 International system of electrode placement. Electrical activity was reviewed with band pass filter of 1-'70Hz'$ , sensitivity of 7 uV/mm, display speed of 67m/sec with a '60Hz'$  notched filter applied as appropriate. EEG data were recorded continuously and digitally stored.  Video monitoring was available and reviewed as appropriate. Description: The posterior dominant rhythm consists of 7.5 Hz activity of moderate voltage (25-35 uV) seen predominantly in posterior head regions, symmetric and reactive  to eye opening and eye closing. EEG showed intermittent generalized 3 to 6 Hz theta-delta slowing. Hyperventilation and photic stimulation were not performed.   ABNORMALITY - Intermittent slow, generalized IMPRESSION: This study is suggestive of mild diffuse encephalopathy, nonspecific etiology. No seizures or epileptiform discharges were seen throughout the recording. PLora Havens  MR BRAIN WO CONTRAST  Result Date: 11/08/2021 CLINICAL DATA:  Fall, stroke suspected EXAM: MRI HEAD WITHOUT CONTRAST TECHNIQUE: Multiplanar, multiecho pulse sequences of the brain and surrounding structures were obtained without intravenous contrast. COMPARISON:  05/15/2019 MRI head, correlation is also made with CT head 11/07/2021 FINDINGS: Evaluation is quite limited by motion artifact. Brain: Mildly increased signal on diffusion-weighted imaging in the left medial temporal lobe (series 5, image 66), with possible ADC correlate (series 6, image 18); this area is associated with diffusely increased T2 hyperintense signal (series 11, image 8-11) which extends posteriorly into the right temporal lobe. On the T2 sequence, there is a suggestion of a T2 hyperintense mass in or medial to the medial right temporal lobe (series 10, image 6 and 7 and series 14, image 18). Suspect trace subdural fluid along the right temporal horn (series 11, image 9), likely trace subdural hemorrhage. No hydrocephalus. Encephalomalacia in the right occipital lobe. Vascular: Grossly normal arterial flow voids. Skull and upper cervical spine: Normal marrow signal. Sinuses/Orbits: Clear paranasal sinuses. The orbits are unremarkable. Other: Fluid in the bilateral mastoid air cells. IMPRESSION: 1. Evaluation is quite limited by motion artifact. A repeat MRI with and without contrast is recommended when the patient is better able to tolerate the exam. 2. Possible mass in or medial to the right temporal lobe. Edema in the right temporal lobe may be related  to the mass. 3. Mildly increased signal on diffusion-weighted imaging in the left medial temporal lobe, with possible ADC correlate, which could be artifactual or reflect a small acute or subacute infarct. 4. Suspect trace subdural hemorrhage along the right temporal horn. These results will be called to the ordering clinician or representative by the Radiologist Assistant, and communication documented in the PACS or [employer] Electronically Signed   By: AMerilyn BabaM.D.   On: 11/08/2021 00:40   DG CHEST PORT 1 VIEW  Result Date: 11/07/2021 CLINICAL DATA:  Shortness of breath, CHF EXAM: PORTABLE CHEST 1 VIEW COMPARISON:  Previous studies including the examination done earlier today FINDINGS: Transverse diameter of heart is increased. Central pulmonary vessels are prominent. Increased interstitial markings are seen in parahilar regions and lower lung fields suggesting pulmonary edema. Small bilateral pleural effusions are seen, more so on the right  side. Overall, no significant interval changes are noted in comparison with the immediate previous study. IMPRESSION: Cardiomegaly. CHF. Small bilateral pleural effusions, more so on the right side. Electronically Signed   By: Elmer Picker M.D.   On: 11/07/2021 16:18   ECHOCARDIOGRAM COMPLETE  Result Date: 11/07/2021    ECHOCARDIOGRAM REPORT   Patient Name:   JSAON YOO Date of Exam: 11/07/2021 Medical Rec #:  [id number]        Height:       69.0 in Accession #:    [id number]       Weight:       224.9 lb Date of Birth:  1955/09/29        BSA:          2.172 m Patient Age:    25 years         BP:           119/94 mmHg Patient Gender: M                HR:           81 bpm. Exam Location:  [facility] Procedure: 2D Echo, Cardiac Doppler and Color Doppler Indications:    Stroke I63.9  History:        Patient has prior history of Echocardiogram examinations, most                 recent 08/31/2020. Cardiomyopathy and CHF, CAD and Previous                  Myocardial Infarction, Stroke; Risk Factors:Hypertension,                 Diabetes, Dyslipidemia and Current Smoker. Acute metabolic                 encephalopathy. Hx of Apical mural thrombus. Cocaine use. S/P                 LAD DES June 2014.  Sonographer:    Alvino Chapel RCS Referring Phys: [id number] ASIA B [place]  Sonographer Comments: Somewhat difficult study due to patient moving around alot and c/o back pain. IMPRESSIONS  1. Left ventricular ejection fraction, by estimation, is 25 to 30%. The left ventricle has severely decreased function. The left ventricle demonstrates regional wall motion abnormalities (LAD and RCA infract pattern). There is moderate asymmetric left ventricular hypertrophy of the septal segment. Left ventricular diastolic parameters are consistent with Grade III diastolic dysfunction (restrictive). There is the interventricular septum is flattened in diastole ('D' shaped left ventricle), consistent with right ventricular volume overload and the interventricular septum is flattened in systole, consistent with right ventricular pressure overload.There is no apical thrombus on contrast images with aneursysmal apex.  2. Right ventricular systolic function is low normal. The right ventricular size is normal. There is mildly elevated pulmonary artery systolic pressure. The estimated right ventricular systolic pressure is 45.3 mmHg.  3. The mitral valve is abnormal. No evidence of mitral valve regurgitation. No evidence of mitral stenosis.  4. The aortic valve was not well visualized. There is mild calcification of the aortic valve. Aortic valve regurgitation is not visualized. Aortic valve sclerosis is present, with no evidence of aortic valve stenosis.  5. The inferior vena cava is dilated in size with <50% respiratory variability, suggesting right atrial pressure of 15 mmHg. Comparison(s): LVEF worse that prior reporting. FINDINGS  Left Ventricle: Left ventricular ejection fraction,  by estimation, is 25 to 30%. The  left ventricle has severely decreased function. The left ventricle demonstrates regional wall motion abnormalities. Definity contrast agent was given IV to delineate the left ventricular endocardial borders. The left ventricular internal cavity size was normal in size. There is moderate asymmetric left ventricular hypertrophy of the septal segment. The interventricular septum is flattened in diastole ('D' shaped left  ventricle), consistent with right ventricular volume overload and the interventricular septum is flattened in systole, consistent with right ventricular pressure overload. Left ventricular diastolic parameters are consistent with Grade III diastolic dysfunction (restrictive).  LV Wall Scoring: The entire apex and mid inferoseptal segment are akinetic. The mid anteroseptal segment, mid inferolateral segment, mid anterolateral segment, mid anterior segment, and mid inferior segment are hypokinetic. Right Ventricle: The right ventricular size is normal. No increase in right ventricular wall thickness. Right ventricular systolic function is low normal. There is mildly elevated pulmonary artery systolic pressure. The tricuspid regurgitant velocity is 2.73 m/s, and with an assumed right atrial pressure of 15 mmHg, the estimated right ventricular systolic pressure is 26.3 mmHg. Left Atrium: Left atrial size was normal in size. Right Atrium: Right atrial size was normal in size. Pericardium: Trivial pericardial effusion is present. Mitral Valve: The mitral valve is abnormal. No evidence of mitral valve regurgitation. No evidence of mitral valve stenosis. Tricuspid Valve: The tricuspid valve is normal in structure. Tricuspid valve regurgitation is trivial. No evidence of tricuspid stenosis. Aortic Valve: The aortic valve was not well visualized. There is mild calcification of the aortic valve. Aortic valve regurgitation is not visualized. Aortic valve sclerosis is present, with  no evidence of aortic valve stenosis. Pulmonic Valve: The pulmonic valve was normal in structure. Pulmonic valve regurgitation is trivial. No evidence of pulmonic stenosis. Aorta: The aortic root is normal in size and structure and the ascending aorta was not well visualized. Venous: The inferior vena cava is dilated in size with less than 50% respiratory variability, suggesting right atrial pressure of 15 mmHg. IAS/Shunts: No atrial level shunt detected by color flow Doppler.  LEFT VENTRICLE PLAX 2D LVIDd:         5.40 cm   Diastology LVIDs:         4.60 cm   LV e' medial:    3.50 cm/s LV PW:         1.10 cm   LV E/e' medial:  25.1 LV IVS:        1.30 cm   LV e' lateral:   3.50 cm/s LVOT diam:     2.00 cm   LV E/e' lateral: 25.1 LV SV:         49 LV SV Index:   23 LVOT Area:     3.14 cm  RIGHT VENTRICLE RV S prime:     9.49 cm/s TAPSE (M-mode): 1.6 cm LEFT ATRIUM             Index        RIGHT ATRIUM           Index LA diam:        4.40 cm 2.03 cm/m   RA Area:     19.90 cm LA Vol (A2C):   59.8 ml 27.54 ml/m  RA Volume:   59.70 ml  27.49 ml/m LA Vol (A4C):   68.7 ml 31.64 ml/m LA Biplane Vol: 63.9 ml 29.43 ml/m  AORTIC VALVE LVOT Vmax:   91.40 cm/s LVOT Vmean:  55.900 cm/s LVOT VTI:    0.157 m  AORTA Ao Root diam:  3.50 cm MITRAL VALVE               TRICUSPID VALVE MV Area (PHT): 5.54 cm    TR Peak grad:   29.8 mmHg MV Decel Time: 137 msec    TR Vmax:        273.00 cm/s MV E velocity: 87.80 cm/s MV A velocity: 37.70 cm/s  SHUNTS MV E/A ratio:  2.33        Systemic VTI:  0.16 m                            Systemic Diam: 2.00 cm Rudean Haskell MD Electronically signed by Rudean Haskell MD Signature Date/Time: 11/07/2021/12:57:22 PM    Final         Scheduled Meds:  carvedilol  3.125 mg Oral BID WC   feeding supplement  237 mL Oral BID BM   furosemide  40 mg Intravenous Q12H   insulin aspart  0-15 Units Subcutaneous TID WC   insulin aspart  0-5 Units Subcutaneous QHS   nicotine  21 mg  Transdermal Daily   thiamine (VITAMIN B1) injection  100 mg Intravenous Daily   Or   thiamine  100 mg Oral Daily   Continuous Infusions:  sodium chloride 50 mL/hr at 11/09/21 0638   acyclovir (ZOVIRAX) 900 mg in dextrose 5 % 250 mL IVPB Stopped (11/09/21 7353)     LOS: 2 days   Time spent= 35 mins    Kerby Hockley Arsenio Loader, MD [facility]  If 7PM-7AM, please contact night-coverage  11/09/2021, 8:23 AM

## 2021-11-09 NOTE — Progress Notes (Signed)
Will update recs after seeing the patient today. MRI read as intra-axial tumor. I think it is quite clearly extra-axial and consistent with a meningioma or dural-based metastasis. Given his prior CT chest findings, I would recommend workup for a primary if he is able to cooperate.

## 2021-11-09 NOTE — Consult Note (Signed)
NAME:  [name], MRN:  [id number], DOB:  [birth year], LOS: 2 ADMISSION DATE:  11/07/2021, CONSULTATION DATE:  11/08/21 REFERRING MD:  Reesa Chew [facility]), CHIEF COMPLAINT:  pleural effusion   History of Present Illness:  66yo male smoker with hx polysubstance abuse, CAD, CKD, HTN, OSA initially admitted 10/29 with back pain. W/u included MRI which revealed probable R temporal lobe mass as well as pleural effusions and mediastinal adenopathy. Nsgy concerned that temporal lobe mass is dural- based metastasis, so PCCM consulted to eval mediastinal adenopathy as w/u for primary malignancy. Of note pt has had odd behavior, attempting to leave AMA, uncooperative with staff, refusing medications and procedures.   Pertinent  Medical History   has a past medical history of Bulging lumbar disc, CAD (coronary artery disease) (3\7\9024), Chronic lower back pain, CKD (chronic kidney disease), stage II, Cocaine abuse ([place]), DM2 (diabetes mellitus, type 2) ([place]), Essential hypertension, GERD (gastroesophageal reflux disease), Headache, History of pneumonia, Hyperlipidemia, Ischemic cardiomyopathy, LV (left ventricular) mural thrombus, and Sleep apnea.   Significant Hospital Events: Including procedures, antibiotic start and stop dates in addition to other pertinent events   CT chest/abd/pelvis 10/29>>> 1. No acute intrathoracic or intra-abdominal injury. 2. Pathologic mediastinal adenopathy, new since prior examination, suspicious for a lymphoproliferative process. PET CT examination or short-term follow-up CT examination in 3 months once the patient's acute issues have resolved may be helpful for further evaluation. 3. Mild to moderate pulmonary edema, possibly cardiogenic in nature. Large right and small left pleural effusions. Mild ascites and diffuse subcutaneous edema within the abdominal wall in keeping with mild anasarca. 4. Mild cardiomegaly. Multi-vessel coronary artery stenting. 5. Peripheral  vascular disease with occlusion of the left common, internal, and proximal external iliac arteries. Reconstitution of the left external iliac artery via the inferior epigastric artery. 6. 3.3 cm fusiform right common iliac artery aneurysm. MR brain 10/30>>>1. Motion degraded exam. 2. 2.1 x 2.0 x 1.8 cm enhancing mass positioned at the medial right temporal horn. This is favored to be intra-axial in location, with primary differential considerations including a solitary intracranial metastasis versus a primary CNS neoplasm. Associated vasogenic edema and/or infiltrating nonenhancing tumor within the adjacent right temporal lobe. 3. No other acute intracranial abnormality. 4. Underlying atrophy with chronic microvascular ischemic disease, with a small chronic right PCA distribution infarct. Interim History / Subjective:  Seen in consultation - in bed, no c/o at this time.  >100 pack year smoking hx - 2ppd since age 40 Denies cough, SOB, hemoptysis, weight loss, obvious axillary/cervical/supraclavicular lymphadenopathy  Still having some back pain, testicular pain and swelling although this is not new   Objective   Blood pressure (!) 136/97, pulse 82, temperature 97.9 F (36.6 C), temperature source Oral, resp. rate 18, height '5\' 9"'$  (1.753 m), weight 102 kg, SpO2 97 %.        Intake/Output Summary (Last 24 hours) at 11/09/2021 1108 Last data filed at 11/09/2021 0700 Gross per 24 hour  Intake 1495.95 ml  Output 600 ml  Net 895.95 ml   Filed Weights   11/07/21 0049 11/09/21 0442  Weight: 102 kg 102 kg    Examination: General: pleasant slightly confused male, NAD  HENT: mm moist, no JVD, no cervical lymphadenopathy  Lungs: resps even non labored, clear, slightly diminished R base  Cardiovascular: s1s2 rrr Abdomen: soft, non tender  Extremities: warm and dry, no axillary, cervical, supraclavicular lymphadenopathy  Neuro: awake, alert, seems appropriate at this time      Assessment &  Plan:  Mediastinal lymphadenopathy  Pleural effusion  ?Temporal metastasis   PLAN -  Unclear significant of mediastinal lymphadenopathy without other obvious primary malignancy. Pleural effusion could certainly be transudative in setting mild volume overload as he does also appear to have a bit of pulmonary edema. Higher risk for lung ca given significant smoking hx. Likely have a few choices for w/u depending on pt's cooperation.Marland Kitchen  1)Consider IR thora R pleural effusion although likely less diagnostic yield  2)Outpt PET to look for primary site +/- peripheral node for bx  3)EBUS while he is inpt - might be best option given that he may be high risk to not f/u 4) ?? consider further testicular imaging +/- urology input  Nsgy following  Will discuss with Dr. Lamonte Sakai and [place] (right click and "Reselect all SmartList Selections" daily)   Diet/type: Regular consistency (see orders) DVT prophylaxis: SCD GI prophylaxis: N/A Lines: N/A Foley:  N/A Code Status:  full code Last date of multidisciplinary goals of care discussion [per TRH]  Labs   CBC: Recent Labs  Lab 11/07/21 0141  WBC 11.0*  HGB 11.8*  HCT 38.6*  MCV 81.4  PLT 426    Basic Metabolic Panel: Recent Labs  Lab 11/07/21 0141  NA 139  K 3.7  CL 107  CO2 25  GLUCOSE 208*  BUN 18  CREATININE 1.57*  CALCIUM 8.1*   GFR: Estimated Creatinine Clearance: 53.7 mL/min (A) (by C-G formula based on SCr of 1.57 mg/dL (H)). Recent Labs  Lab 11/07/21 0141  WBC 11.0*    Liver Function Tests: Recent Labs  Lab 11/07/21 0141  AST 25  ALT 24  ALKPHOS 30*  BILITOT 0.6  PROT 7.5  ALBUMIN 2.7*   Recent Labs  Lab 11/07/21 0141  LIPASE 24   Recent Labs  Lab 11/08/21 0556  AMMONIA 19    ABG    Component Value Date/Time   HCO3 34.5 (H) 11/07/2021 0833   TCO2 26 11/03/2016 1727   O2SAT 16.8 11/07/2021 0833     Coagulation Profile: No results for input(s): "INR",  "PROTIME" in the last 168 hours.  Cardiac Enzymes: No results for input(s): "CKTOTAL", "CKMB", "CKMBINDEX", "TROPONINI" in the last 168 hours.  HbA1C: Hgb A1c MFr Bld  Date/Time Value Ref Range Status  12/11/2020 12:18 PM 9.9 (H) 4.8 - 5.6 % Final    Comment:    (NOTE) Pre diabetes:          5.7%-6.4%  Diabetes:              >6.4%  Glycemic control for   <7.0% adults with diabetes   05/16/2019 04:10 AM 9.6 (H) 4.8 - 5.6 % Final    Comment:    (NOTE) Pre diabetes:          5.7%-6.4% Diabetes:              >6.4% Glycemic control for   <7.0% adults with diabetes     CBG: Recent Labs  Lab 11/08/21 0815 11/08/21 1146 11/08/21 1611 11/08/21 2126 11/09/21 0805  GLUCAP 177* 117* 104* 222* 157*    Review of Systems:   As per HPI - All other systems reviewed and were neg.    Past Medical History:  He,  has a past medical history of Bulging lumbar disc, CAD (coronary artery disease) (8\3\4196), Chronic lower back pain, CKD (chronic kidney disease), stage II, Cocaine abuse ([place]), DM2 (diabetes mellitus, type 2) ([place]), Essential hypertension, GERD (gastroesophageal reflux  disease), Headache, History of pneumonia, Hyperlipidemia, Ischemic cardiomyopathy, LV (left ventricular) mural thrombus, and Sleep apnea.   Surgical History:   Past Surgical History:  Procedure Laterality Date   APPENDECTOMY     CARDIAC CATHETERIZATION N/A 09/07/2015   Procedure: Left Heart Cath and Coronary Angiography;  Surgeon: Leonie Man, MD;  Location: [place] CV LAB;  Service: Cardiovascular;  Laterality: N/A;   CARDIAC CATHETERIZATION N/A 09/07/2015   Procedure: Coronary Stent Intervention;  Surgeon: Leonie Man, MD;  Location: [place] [facility];  Service: Cardiovascular;  Laterality: N/A;   CORONARY ANGIOGRAM  09/07/13   residual RCA and OM disease   CORONARY ANGIOPLASTY WITH STENT PLACEMENT     CORONARY STENT INTERVENTION N/A 10/16/2018   Procedure: CORONARY STENT INTERVENTION;   Surgeon: Burnell Blanks, MD;  Location: [place] [facility];  Service: Cardiovascular;  Laterality: N/A;   CORONARY STENT INTERVENTION N/A 03/11/2019   Procedure: CORONARY STENT INTERVENTION;  Surgeon: [name], MD;  Location: [place] [facility];  Service: Cardiovascular;  Laterality: N/A;   FRACTURE SURGERY     INCISION AND DRAINAGE OF WOUND Left 05/19/2019   Procedure: DEBRIDEMENT LEFT GROIN;  Surgeon: Georganna Skeans, MD;  Location: [place];  Service: General;  Laterality: Left;   INSERTION OF ILIAC STENT Left 11/30/2017   Left external illiac stent   INSERTION OF ILIAC STENT  11/30/2017   Procedure: Insertion Of Iliac Stent;  Surgeon: Lorretta Harp, MD;  Location: [place] [facility];  Service: Cardiovascular;;  Left external illiac stent   KNEE ARTHROSCOPY Left    KNEE SURGERY     "ligaments, cartilage; tendon, put a pin in" (11/30/2017)   LEFT HEART CATH Bilateral 07/08/2012   Procedure: LEFT HEART CATH;  Surgeon: [name], MD;  Location: [facility] CATH LAB;  Service: Cardiovascular;  Laterality: Bilateral;   LEFT HEART CATH AND CORONARY ANGIOGRAPHY N/A 10/16/2018   Procedure: LEFT HEART CATH AND CORONARY ANGIOGRAPHY;  Surgeon: Burnell Blanks, MD;  Location: [place] [facility];  Service: Cardiovascular;  Laterality: N/A;   LEFT HEART CATH AND CORONARY ANGIOGRAPHY N/A 03/11/2019   Procedure: LEFT HEART CATH AND CORONARY ANGIOGRAPHY;  Surgeon: [name], MD;  Location: [place] [facility];  Service: Cardiovascular;  Laterality: N/A;   LEFT HEART CATHETERIZATION WITH CORONARY ANGIOGRAM N/A 09/06/2013   STEMI, 2nd ISR LAD. Procedure: LEFT HEART CATHETERIZATION WITH CORONARY ANGIOGRAM;  Surgeon: [name], MD;  Location: [facility] C[facility];  Service: Cardiovascular;  Laterality: N/A;   LOWER EXTREMITY ANGIOGRAPHY N/A 11/30/2017   Procedure: LOWER EXTREMITY ANGIOGRAPHY;  Surgeon: Lorretta Harp, MD;  Location: [place] [facility];  Service:  Cardiovascular;  Laterality: N/A;   PERCUTANEOUS CORONARY STENT INTERVENTION (PCI-S)  07/08/2012   Procedure: PERCUTANEOUS CORONARY STENT INTERVENTION (PCI-S);  Surgeon: [name], MD;  Location: [facility] C[facility] LAB;  Service: Cardiovascular;;  DES LAD   PERCUTANEOUS CORONARY STENT INTERVENTION (PCI-S) N/A 09/06/2013   Procedure: PERCUTANEOUS CORONARY STENT INTERVENTION (PCI-S);  Surgeon: [name], MD;  Location: [facility] CATH LAB;  Service: Cardiovascular;  Laterality: N/A;  Mid LAD 3.0/24mm Promus   WOUND DEBRIDEMENT Left 05/20/2019   Procedure: DEBRIDEMENT GROIN;  Surgeon: Kinsinger, Arta Bruce, MD;  Location: [place];  Service: General;  Laterality: Left;   WRIST FRACTURE SURGERY Bilateral      Social History:   reports that he has been smoking cigarettes. He has a 24.00 pack-year smoking history. He has never used smokeless tobacco.  He reports that he does not currently use alcohol. He reports that he does not use drugs.   Family History:  His family history includes Diabetes in his mother; Hypertension in his mother.   Allergies Allergies  Allergen Reactions   Clopidogrel     Other reaction(s): Drowsy, Skin irritation     Home Medications  Prior to Admission medications   Medication Sig Start Date End Date Taking? Authorizing Provider  gabapentin (NEURONTIN) 300 MG capsule Take 1 capsule (300 mg total) by mouth 3 (three) times daily. 08/06/21 11/07/21 Yes Kathe Becton R, PA-C  sildenafil (VIAGRA) 50 MG tablet Take 1 tablet (50 mg total) by mouth daily as needed for erectile dysfunction. 08/13/21  Yes O'Neal, Cassie Freer, MD  tadalafil (CIALIS) 20 MG tablet Take 20 mg by mouth daily as needed. 10/08/21  Yes [provider]  atorvastatin (LIPITOR) 40 MG tablet Take 40 mg by mouth at bedtime. Patient not taking: Reported on 11/07/2021 09/08/21   [provider]  ciprofloxacin (CIPRO) 500 MG tablet Take 500 mg by mouth 2 (two) times daily. Patient not taking:  Reported on 11/07/2021 08/27/21   [provider]  insulin glargine-yfgn (SEMGLEE) 100 UNIT/ML injection Inject 20 Units into the skin See admin instructions. Inject 20 units under skin at bedtime for diabetes mellitus do not mix with other insulins in the same syringe-discard bottle 28 days after opening **replaces Lantus** Patient not taking: Reported on 11/07/2021 01/14/21   [provider]  JANUMET 50-500 MG tablet Take 1 tablet by mouth 2 (two) times daily. Patient not taking: Reported on 11/07/2021 09/08/21   [provider]  meloxicam (MOBIC) 15 MG tablet Take 15 mg by mouth daily as needed. Patient not taking: Reported on 11/07/2021 10/08/21   [provider]  metFORMIN (GLUCOPHAGE) 500 MG tablet TAKE ONE TABLET BY MOUTH TWO TIMES A DAY FOR DIABETES Patient not taking: Reported on 11/07/2021 12/15/20   [provider]  sertraline (ZOLOFT) 50 MG tablet Take 50 mg by mouth at bedtime. Patient not taking: Reported on 11/07/2021 09/08/21   [provider]  traMADol (ULTRAM) 50 MG tablet Take 1 tablet (50 mg total) by mouth every 6 (six) hours as needed. Patient not taking: Reported on 11/07/2021 08/18/21   Stoneking, Reece Leader., MD      Nickolas Madrid, NP Pulmonary/Critical Care Medicine  11/09/2021  11:08 AM

## 2021-11-09 NOTE — Progress Notes (Signed)
Speech Language Pathology Treatment: Cognitive-Linquistic  Patient Details Name: [name] MRN: [id number] DOB: Jul 03, 1955 Today's Date: 11/09/2021 Time: 1660-6301 SLP Time Calculation (min) (ACUTE ONLY): 25 min  Assessment / Plan / Recommendation Clinical Impression  Patient seen by SLP for skilled treatment focused on cognitive goals. SLP had not intended to see patient for treatment session after initial evaluation earlier today, but patient called out and asking about his lunch tray, pointing to folded up towel on sink across from where he was sitting and asking, "is that it?" Patient asked about his vision and he did report difficulty and when SLP asked about glasses, he said that his glasses were left "at the funeral home" (did not elaborate on why there).  He was much more interactive than during initial evaluation and was more forthcoming with information when SLP asked. SLP provided patient with a menu and wrote the number out in larger print, instructing him to call kitchen to place his lunch order. SLP turned around to look at patient's chart on computer and patient then independently used phone number and called and placed his lunch order. He told SLP that he was aware and accepting that he was going to be here in the hospital for a few more days and that "Pop" talked to me. ("Pop" is his uncle) He also reported that when he is discharged from the hospital he will be staying with "Pop". SLP ensured all needs met before leaving room. SLP will continue to follow patient for cognitive function goals.    HPI HPI: Patient is a 66 y.o. male with PMH: CAD, CKD, cocaine abuse, DM-2, ischemic cardiomyopathy, sleep apnea, GERD, HLD. He presented to the hospital on 11/07/21 with c/o of hurting his back and swelling of his testicles. He reported that he had his his head but denied losing consciousness. Patient was having hallucinations and displaying inappropriate behavior towards staff. CDT  showed hypodensity in right temporal lobe and MRI showed possible mass right temporal lobe, suspected trace SDH right temporal horn and possible small acute or subacute infarct in temporal lobe.      SLP Plan  Continue with current plan of care  Patient needs continued Speech Lanaguage Pathology Services   Recommendations for follow up therapy are one component of a multi-disciplinary discharge planning process, led by the attending physician.  Recommendations may be updated based on patient status, additional functional criteria and insurance authorization.    Recommendations                   Follow Up Recommendations: Follow physician's recommendations for discharge plan and follow up therapies Assistance recommended at discharge: Intermittent Supervision/Assistance SLP Visit Diagnosis: Cognitive communication deficit (S01.093) Plan: Continue with current plan of care           Sonia Baller, MA, CCC-SLP Speech Therapy

## 2021-11-09 NOTE — Progress Notes (Signed)
Pt with waxing states of confusion. He is at one point oriented and knows time place and situation and times disoriented to sl three. He keeps pulling off lines snd Telemetry monitor. Short lived succeeded st

## 2021-11-10 ENCOUNTER — Inpatient Hospital Stay (HOSPITAL_COMMUNITY): Payer: No Typology Code available for payment source

## 2021-11-10 ENCOUNTER — Encounter (HOSPITAL_COMMUNITY)
Admission: EM | Disposition: A | Discharge status: Home Health | Payer: Self-pay | Source: Home / Self Care | Attending: Internal Medicine

## 2021-11-10 DIAGNOSIS — J90 Pleural effusion, not elsewhere classified: Secondary | ICD-10-CM | POA: Diagnosis not present

## 2021-11-10 DIAGNOSIS — N179 Acute kidney failure, unspecified: Secondary | ICD-10-CM | POA: Diagnosis not present

## 2021-11-10 DIAGNOSIS — G9341 Metabolic encephalopathy: Secondary | ICD-10-CM | POA: Diagnosis not present

## 2021-11-10 DIAGNOSIS — J9601 Acute respiratory failure with hypoxia: Secondary | ICD-10-CM | POA: Diagnosis not present

## 2021-11-10 DIAGNOSIS — D496 Neoplasm of unspecified behavior of brain: Secondary | ICD-10-CM | POA: Diagnosis not present

## 2021-11-10 DIAGNOSIS — I5023 Acute on chronic systolic (congestive) heart failure: Secondary | ICD-10-CM | POA: Diagnosis not present

## 2021-11-10 HISTORY — PX: THORACENTESIS: SHX235

## 2021-11-10 LAB — BASIC METABOLIC PANEL
Anion gap: 8 (ref 5–15)
BUN: 21 mg/dL (ref 8–23)
CO2: 29 mmol/L (ref 22–32)
Calcium: 8.1 mg/dL — ABNORMAL LOW (ref 8.9–10.3)
Chloride: 101 mmol/L (ref 98–111)
Creatinine, Ser: 1.53 mg/dL — ABNORMAL HIGH (ref 0.61–1.24)
GFR, Estimated: 50 mL/min — ABNORMAL LOW (ref >60)
Glucose, Bld: 111 mg/dL — ABNORMAL HIGH (ref 70–99)
Potassium: 3.6 mmol/L (ref 3.5–5.1)
Sodium: 138 mmol/L (ref 135–145)

## 2021-11-10 LAB — VITAMIN B1: Vitamin B1 (Thiamine): 118.9 nmol/L (ref 66.5–200.0)

## 2021-11-10 LAB — GLUCOSE, CAPILLARY
Glucose-Capillary: 169 mg/dL — ABNORMAL HIGH (ref 70–99)
Glucose-Capillary: 154 mg/dL — ABNORMAL HIGH (ref 70–99)
Glucose-Capillary: 198 mg/dL — ABNORMAL HIGH (ref 70–99)
Glucose-Capillary: 197 mg/dL — ABNORMAL HIGH (ref 70–99)

## 2021-11-10 LAB — CBC
HCT: 41.9 % (ref 39.0–52.0)
Hemoglobin: 12.8 g/dL — ABNORMAL LOW (ref 13.0–17.0)
MCH: 24.3 pg — ABNORMAL LOW (ref 26.0–34.0)
MCHC: 30.5 g/dL (ref 30.0–36.0)
MCV: 79.7 fL — ABNORMAL LOW (ref 80.0–100.0)
Platelets: 268 10*3/uL (ref 150–400)
RBC: 5.26 MIL/uL (ref 4.22–5.81)
RDW: 20.8 % — ABNORMAL HIGH (ref 11.5–15.5)
WBC: 9 10*3/uL (ref 4.0–10.5)
nRBC: 0 % (ref 0.0–0.2)

## 2021-11-10 LAB — HEMOGLOBIN A1C
Hgb A1c MFr Bld: 7.3 % — ABNORMAL HIGH (ref 4.8–5.6)
Mean Plasma Glucose: 162.81 mg/dL

## 2021-11-10 LAB — LIPID PANEL
Cholesterol: 124 mg/dL (ref 0–200)
HDL: 24 mg/dL — ABNORMAL LOW (ref >40)
LDL Cholesterol: 91 mg/dL (ref 0–99)
Total CHOL/HDL Ratio: 5.2 RATIO
Triglycerides: 43 mg/dL (ref <150)
VLDL: 9 mg/dL (ref 0–40)

## 2021-11-10 LAB — BODY FLUID CELL COUNT WITH DIFFERENTIAL
Eos, Fluid: 0 %
Lymphs, Fluid: 76 %
Monocyte-Macrophage-Serous Fluid: 16 % — ABNORMAL LOW (ref 50–90)
Neutrophil Count, Fluid: 8 % (ref 0–25)
Total Nucleated Cell Count, Fluid: 233 cu mm (ref 0–1000)

## 2021-11-10 LAB — LACTATE DEHYDROGENASE, PLEURAL OR PERITONEAL FLUID: LD, Fluid: 55 U/L — ABNORMAL HIGH (ref 3–23)

## 2021-11-10 LAB — PROTEIN, PLEURAL OR PERITONEAL FLUID: Total protein, fluid: <3 g/dL

## 2021-11-10 LAB — CANCER ANTIGEN 19-9: CA 19-9: 19 U/mL (ref 0–35)

## 2021-11-10 LAB — CEA: CEA: 2.7 ng/mL (ref 0.0–4.7)

## 2021-11-10 LAB — MAGNESIUM: Magnesium: 1.8 mg/dL (ref 1.7–2.4)

## 2021-11-10 LAB — BRAIN NATRIURETIC PEPTIDE: B Natriuretic Peptide: 641.8 pg/mL — ABNORMAL HIGH (ref 0.0–100.0)

## 2021-11-10 SURGERY — THORACENTESIS
Anesthesia: Topical | Laterality: Right

## 2021-11-10 MED ORDER — HEPARIN SODIUM (PORCINE) 5000 UNIT/ML IJ SOLN
5000.0000 [IU] | Freq: Three times a day (TID) | INTRAMUSCULAR | Status: DC
  Administered 2021-11-10 – 2021-11-12 (×7): 5000 [IU] via SUBCUTANEOUS
  Filled 2021-11-10 (×8): qty 1

## 2021-11-10 MED ORDER — ORAL CARE MOUTH RINSE: 15.0000 mL | OROMUCOSAL | Status: DC | PRN

## 2021-11-10 NOTE — Progress Notes (Signed)
  Transition of Care [facility]) Screening Note   Patient Details  Name: [name] Date of Birth: [birth year]   Transition of Care [facility]) CM/SW Contact:    Cyndi Bender, RN Phone Number: 11/10/2021, 9:19 AM    Transition of Care Department [facility]) has reviewed patient.  Heart failure consult placed.    We will continue to monitor patient advancement through interdisciplinary progression rounds. If new patient transition needs arise, please place a TOC consult.

## 2021-11-10 NOTE — Progress Notes (Signed)
NAME:  [name], MRN:  [id number], DOB:  Oct 06, 1955, LOS: 3 ADMISSION DATE:  11/07/2021, CONSULTATION DATE:  11/08/21 REFERRING MD:  Reesa Chew [facility]), CHIEF COMPLAINT:  pleural effusion   History of Present Illness:  66yo male smoker with hx polysubstance abuse, CAD, CKD, HTN, OSA initially admitted 10/29 with back pain. W/u included MRI which revealed probable R temporal lobe mass as well as pleural effusions and mediastinal adenopathy. Nsgy concerned that temporal lobe mass is dural- based metastasis, so PCCM consulted to eval mediastinal adenopathy as w/u for primary malignancy.   Of note pt has had odd behavior, attempting to leave AMA, uncooperative with staff, refusing medications and procedures.   Pertinent  Medical History   has a past medical history of Bulging lumbar disc, CAD (coronary artery disease) (4\4\0102), Chronic lower back pain, CKD (chronic kidney disease), stage II, Cocaine abuse ([place]), DM2 (diabetes mellitus, type 2) ([place]), Essential hypertension, GERD (gastroesophageal reflux disease), Headache, History of pneumonia, Hyperlipidemia, Ischemic cardiomyopathy, LV (left ventricular) mural thrombus, and Sleep apnea.   Significant Hospital Events: Including procedures, antibiotic start and stop dates in addition to other pertinent events   CT chest/abd/pelvis 10/29>>> 1. No acute intrathoracic or intra-abdominal injury. 2. Pathologic mediastinal adenopathy, new since prior examination, suspicious for a lymphoproliferative process. PET CT examination or short-term follow-up CT examination in 3 months once the patient's acute issues have resolved may be helpful for further evaluation. 3. Mild to moderate pulmonary edema, possibly cardiogenic in nature. Large right and small left pleural effusions. Mild ascites and diffuse subcutaneous edema within the abdominal wall in keeping with mild anasarca. 4. Mild cardiomegaly. Multi-vessel coronary artery stenting. 5. Peripheral  vascular disease with occlusion of the left common, internal, and proximal external iliac arteries. Reconstitution of the left external iliac artery via the inferior epigastric artery. 6. 3.3 cm fusiform right common iliac artery aneurysm. MR brain 10/30>>>1. Motion degraded exam. 2. 2.1 x 2.0 x 1.8 cm enhancing mass positioned at the medial right temporal horn. This is favored to be intra-axial in location, with primary differential considerations including a solitary intracranial metastasis versus a primary CNS neoplasm. Associated vasogenic edema and/or infiltrating nonenhancing tumor within the adjacent right temporal lobe. 3. No other acute intracranial abnormality. 4. Underlying atrophy with chronic microvascular ischemic disease, with a small chronic right PCA distribution infarct. Interim History / Subjective:  [place] removed and desaturates to mid 80's Spo2.  Recovers with supplemental O2.  Initially seen ambulating in the hallway with PT on O2, no distress.    Objective   Blood pressure 117/75, pulse 72, temperature 97.7 F (36.5 C), temperature source Oral, resp. rate 18, height '5\' 9"'$  (1.753 m), weight 91.1 kg, SpO2 96 %.        Intake/Output Summary (Last 24 hours) at 11/10/2021 0820 Last data filed at 11/10/2021 0318 Gross per 24 hour  Intake 1133.8 ml  Output 1200 ml  Net -66.2 ml    Filed Weights   11/07/21 0049 11/09/21 0442 11/10/21 0500  Weight: 102 kg 102 kg 91.1 kg    Examination: General: Alert and interactive male sitting up in chair, NAD  HENT: mm moist, no JVD, no cervical lymphadenopathy  Lungs: symmetric lung expansion, diminished at right  Cardiovascular: s1s2, rrr, trace lower extremity edema  Abdomen: soft, non tender, non distended  Extremities: warm and dry Neuro: Alert and oriented, no focal deficits   Assessment & Plan:  Mediastinal lymphadenopathy  Pleural effusion  ?Temporal metastasis  Acute on chronic systolic/diastolic  heart failure  with LVEF 30 % and Grade 3 diastolic dysfunction  Unclear significance of mediastinal lymphadenopathy without other obvious pulmonary primary malignancy. ? If related to volume overload/CHF exacerbation or could consider lymphoma. Patient is receiving diuresis.   We re-dicussed EBUS this am vs PET scan as outpatient.    Pleural effusion ddx includes transudate vs exudate. Discussed thoracentesis to assist in differentiation.  Long history of smoking and would send for cytology as well if patient were to proceed.   He does not want to make any decisions until his "Pops," which is his uncle, is able to discuss the procedure with the providers.  He was unable to talk with him about the procedures yesterday and says he is coming in today.   Nsgy following    Best Practice (right click and "Reselect all SmartList Selections" daily)  Per primary team  Labs   CBC: Recent Labs  Lab 11/07/21 0141 11/10/21 0347  WBC 11.0* 9.0  HGB 11.8* 12.8*  HCT 38.6* 41.9  MCV 81.4 79.7*  PLT 265 268     Basic Metabolic Panel: Recent Labs  Lab 11/07/21 0141 11/10/21 0347  NA 139 138  K 3.7 3.6  CL 107 101  CO2 25 29  GLUCOSE 208* 111*  BUN 18 21  CREATININE 1.57* 1.53*  CALCIUM 8.1* 8.1*  MG  --  1.8    GFR: Estimated Creatinine Clearance: 52.3 mL/min (A) (by C-G formula based on SCr of 1.53 mg/dL (H)). Recent Labs  Lab 11/07/21 0141 11/10/21 0347  WBC 11.0* 9.0     Liver Function Tests: Recent Labs  Lab 11/07/21 0141  AST 25  ALT 24  ALKPHOS 30*  BILITOT 0.6  PROT 7.5  ALBUMIN 2.7*    Recent Labs  Lab 11/07/21 0141  LIPASE 24    Recent Labs  Lab 11/08/21 0556  AMMONIA 19     ABG    Component Value Date/Time   HCO3 34.5 (H) 11/07/2021 0833   TCO2 26 11/03/2016 1727   O2SAT 16.8 11/07/2021 0833     Coagulation Profile: No results for input(s): "INR", "PROTIME" in the last 168 hours.  Cardiac Enzymes: No results for input(s): "CKTOTAL", "CKMB",  "CKMBINDEX", "TROPONINI" in the last 168 hours.  HbA1C: Hgb A1c MFr Bld  Date/Time Value Ref Range Status  11/10/2021 03:47 AM 7.3 (H) 4.8 - 5.6 % Final    Comment:    (NOTE) Pre diabetes:          5.7%-6.4%  Diabetes:              >6.4%  Glycemic control for   <7.0% adults with diabetes   12/11/2020 12:18 PM 9.9 (H) 4.8 - 5.6 % Final    Comment:    (NOTE) Pre diabetes:          5.7%-6.4%  Diabetes:              >6.4%  Glycemic control for   <7.0% adults with diabetes     CBG: Recent Labs  Lab 11/08/21 2126 11/09/21 0805 11/09/21 1247 11/09/21 1645 11/09/21 2135  GLUCAP 222* [place], ACNP Pulmonary/Critical Care Medicine  11/10/2021  8:20 AM

## 2021-11-10 NOTE — Progress Notes (Addendum)
Subjective: Lying comfortably in bed on side, eating breakfast. Refusing examination initially but compliant once done eating.  ?new o2 requirement, on nasal canula   Denies any new symptoms such as current headache (although endorses one he has had "for a minute" which is mild and not bothersome, possibly related to him having lost his glasses a month or so ago), dizziness, visual changes, problems with swallowing or speech, focal muscle weakness, numbness or tingling of extremities, abnormal movements, or other focal neurological deficits.  Objective: Current vital signs: BP 103/73 (BP Location: Left Arm)   Pulse 77   Temp 98.2 F (36.8 C) (Oral)   Resp (!) 22   Ht '5\' 9"'$  (1.753 m)   Wt 91.1 kg   SpO2 92%   BMI 29.66 kg/m  Vital signs in last 24 hours: Temp:  [97.5 F (36.4 C)-98.2 F (36.8 C)] 98.2 F (36.8 C) (11/01 0830) Pulse Rate:  [72-77] 77 (11/01 0830) Resp:  [18-22] 22 (11/01 0830) BP: (103-121)/(64-91) 103/73 (11/01 0830) SpO2:  [92 %-96 %] 92 % (11/01 0830) Weight:  [91.1 kg] 91.1 kg (11/01 0500)  Intake/Output from previous day: 10/31 0701 - 11/01 0700 In: 1133.8 [I.V.:865.8; IV Piggyback:268] Out: 1200 [Urine:1200] Intake/Output this shift: No intake/output data recorded. Nutritional status:  Diet Order             Diet Heart Room service appropriate? Yes; Fluid consistency: Thin; Fluid restriction: 1500 mL Fluid  Diet effective now                  HEENT: [place]/AT Lungs: Respirations unlabored Ext: No edema  Neurologic Exam: Ment: Awake and alert. Oriented to the day, month, year, city and state. Speech is fluent with intact comprehension, repetition, reading, and naming. Slightly dysarthric, suspect baseline. Able to follow all commands.  CN: Eyes are conjugate. Fixates and tracks normally. Does not cooperate with testing of pupillary light reflexes. Face symmetric. Phonation intact. Tongue protrudes midline.  Motor: 5/5 RUE and BLE. Today, no subtle  weakness of LUE proximally and distally with rotating fingers test nonlateralizing, although ascertained on yesterday's examination.  Sensory: Intact to FT bilaterally x 4 with no extinction to DSS.  Reflexes: 1+ bilateral brachioradialis. Unable to elicit patellar or achilles reflexes but patient poorly positioned for testing due to semicompliance with exam.  Cerebellar: No ataxia with FNF bilaterally Gait: Deferred  Lab Results: Results for orders placed or performed during the hospital encounter of 11/07/21 (from the past 48 hour(s))  Glucose, capillary     Status: Abnormal   Collection Time: 11/08/21 11:46 AM  Result Value Ref Range   Glucose-Capillary 117 (H) 70 - 99 mg/dL    Comment: Glucose reference range applies only to samples taken after fasting for at least 8 hours.  Glucose, capillary     Status: Abnormal   Collection Time: 11/08/21  4:11 PM  Result Value Ref Range   Glucose-Capillary 104 (H) 70 - 99 mg/dL    Comment: Glucose reference range applies only to samples taken after fasting for at least 8 hours.  Glucose, capillary     Status: Abnormal   Collection Time: 11/08/21  9:26 PM  Result Value Ref Range   Glucose-Capillary 222 (H) 70 - 99 mg/dL    Comment: Glucose reference range applies only to samples taken after fasting for at least 8 hours.  Glucose, capillary     Status: Abnormal   Collection Time: 11/09/21  8:05 AM  Result Value Ref Range  Glucose-Capillary 157 (H) 70 - 99 mg/dL    Comment: Glucose reference range applies only to samples taken after fasting for at least 8 hours.  Glucose, capillary     Status: Abnormal   Collection Time: 11/09/21 12:47 PM  Result Value Ref Range   Glucose-Capillary 136 (H) 70 - 99 mg/dL    Comment: Glucose reference range applies only to samples taken after fasting for at least 8 hours.  Glucose, capillary     Status: Abnormal   Collection Time: 11/09/21  4:45 PM  Result Value Ref Range   Glucose-Capillary 191 (H) 70 - 99  mg/dL    Comment: Glucose reference range applies only to samples taken after fasting for at least 8 hours.  Glucose, capillary     Status: Abnormal   Collection Time: 11/09/21  9:35 PM  Result Value Ref Range   Glucose-Capillary 128 (H) 70 - 99 mg/dL    Comment: Glucose reference range applies only to samples taken after fasting for at least 8 hours.  Brain natriuretic peptide     Status: Abnormal   Collection Time: 11/10/21  3:47 AM  Result Value Ref Range   B Natriuretic Peptide 641.8 (H) 0.0 - 100.0 pg/mL    Comment: Performed at [facility] [address], [place], [place] [address]  Basic metabolic panel     Status: Abnormal   Collection Time: 11/10/21  3:47 AM  Result Value Ref Range   Sodium 138 135 - 145 mmol/L   Potassium 3.6 3.5 - 5.1 mmol/L   Chloride 101 98 - 111 mmol/L   CO2 29 22 - 32 mmol/L   Glucose, Bld 111 (H) 70 - 99 mg/dL    Comment: Glucose reference range applies only to samples taken after fasting for at least 8 hours.   BUN 21 8 - 23 mg/dL   Creatinine, Ser 1.53 (H) 0.61 - 1.24 mg/dL   Calcium 8.1 (L) 8.9 - 10.3 mg/dL   GFR, Estimated 50 (L) >60 mL/min    Comment: (NOTE) Calculated using the CKD-EPI Creatinine Equation (2021)    Anion gap 8 5 - 15    Comment: Performed at [facility] [address], [place], [place] [address]  CBC     Status: Abnormal   Collection Time: 11/10/21  3:47 AM  Result Value Ref Range   WBC 9.0 4.0 - 10.5 K/uL   RBC 5.26 4.22 - 5.81 MIL/uL   Hemoglobin 12.8 (L) 13.0 - 17.0 g/dL   HCT 41.9 39.0 - 52.0 %   MCV 79.7 (L) 80.0 - 100.0 fL   MCH 24.3 (L) 26.0 - 34.0 pg   MCHC 30.5 30.0 - 36.0 g/dL   RDW 20.8 (H) 11.5 - 15.5 %   Platelets 268 150 - 400 K/uL   nRBC 0.0 0.0 - 0.2 %    Comment: Performed at [facility], [place] [address], [place], [place] [address]  Magnesium     Status: None   Collection Time: 11/10/21  3:47 AM  Result Value Ref Range   Magnesium 1.8 1.7 - 2.4 mg/dL    Comment: Performed at  [facility] [address], [place], [place] [address]  Hemoglobin A1c     Status: Abnormal   Collection Time: 11/10/21  3:47 AM  Result Value Ref Range   Hgb A1c MFr Bld 7.3 (H) 4.8 - 5.6 %    Comment: (NOTE) Pre diabetes:  5.7%-6.4%  Diabetes:              >6.4%  Glycemic control for   <7.0% adults with diabetes    Mean Plasma Glucose 162.81 mg/dL    Comment: Performed at [facility] [address] [place], [place] [address]  Lipid panel     Status: Abnormal   Collection Time: 11/10/21  3:47 AM  Result Value Ref Range   Cholesterol 124 0 - 200 mg/dL   Triglycerides 43 <150 mg/dL   HDL 24 (L) >40 mg/dL   Total CHOL/HDL Ratio 5.2 RATIO   VLDL 9 0 - 40 mg/dL   LDL Cholesterol 91 0 - 99 mg/dL    Comment:        Total Cholesterol/HDL:CHD Risk Coronary Heart Disease Risk Table                     Men   Women  1/2 Average Risk   3.4   3.3  Average Risk       5.0   4.4  2 X Average Risk   9.6   7.1  3 X Average Risk  23.4   11.0        Use the calculated Patient Ratio above and the CHD Risk Table to determine the patient's CHD Risk.        ATP III CLASSIFICATION (LDL):  <100     mg/dL   Optimal  100-129  mg/dL   Near or Above                    Optimal  130-159  mg/dL   Borderline  160-189  mg/dL   High  >190     mg/dL   Very High Performed at [facility] [address] [place], [place] [address]   Glucose, capillary     Status: Abnormal   Collection Time: 11/10/21  8:32 AM  Result Value Ref Range   Glucose-Capillary 169 (H) 70 - 99 mg/dL    Comment: Glucose reference range applies only to samples taken after fasting for at least 8 hours.    Recent Results (from the past 240 hour(s))  Culture, blood (Routine X 2) w Reflex to ID Panel     Status: None (Preliminary result)   Collection Time: 11/07/21  1:30 AM   Specimen: BLOOD RIGHT FOREARM  Result Value Ref Range Status   Specimen Description   Final    BLOOD RIGHT FOREARM BOTTLES  DRAWN AEROBIC AND ANAEROBIC   Special Requests Blood Culture adequate volume  Final   Culture   Final    NO GROWTH 3 DAYS Performed at [facility], [address], [place], [place] [address]    Report Status PENDING  Incomplete  Culture, blood (Routine X 2) w Reflex to ID Panel     Status: None (Preliminary result)   Collection Time: 11/07/21  1:41 AM   Specimen: Site Not Specified; Blood  Result Value Ref Range Status   Specimen Description   Final    SITE NOT SPECIFIED BOTTLES DRAWN AEROBIC AND ANAEROBIC   Special Requests Blood Culture adequate volume  Final   Culture   Final    NO GROWTH 3 DAYS Performed at [facility], [address], [place], [place] [address]    Report Status PENDING  Incomplete  Urine Culture     Status: None   Collection Time: 11/07/21  8:11 AM   Specimen: Urine,  Clean Catch  Result Value Ref Range Status   Specimen Description   Final    URINE, CLEAN CATCH Performed at [facility], [address], [place], [place] [address]    Special Requests   Final    NONE Performed at [facility], [address] [place], [place] [address]    Culture   Final    NO GROWTH Performed at [facility], [place] [address] [place], [place] [address]    Report Status 11/08/2021 FINAL  Final    Lipid Panel Recent Labs    11/10/21 0347  CHOL 124  TRIG 43  HDL 24*  CHOLHDL 5.2  VLDL 9  LDLCALC 91    Studies/Results: MR BRAIN W WO CONTRAST  Result Date: 11/09/2021 CLINICAL DATA:  Initial evaluation for neuro deficit, stroke suspected. EXAM: MRI HEAD WITHOUT AND WITH CONTRAST TECHNIQUE: Multiplanar, multiecho pulse sequences of the brain and surrounding structures were obtained without and with intravenous contrast. CONTRAST:  24m GADAVIST GADOBUTROL 1 MMOL/ML IV SOLN COMPARISON:  Prior brain MRI from earlier the same day. FINDINGS: Brain: Examination severely degraded by motion artifact, limiting assessment. Age-related cerebral atrophy with chronic  microvascular ischemic disease. Chronic encephalomalacia with hemosiderin staining at the right occipital lobe, consistent with a chronic right PCA distribution infarct. No evidence for acute or recent infarction. No visible acute intracranial hemorrhage. Few chronic micro hemorrhages noted about the pons and left lentiform nucleus, likely hypertensive in nature. There is an enhancing mass involving the medial aspect of the anterior right temporal horn measuring 2.1 x 2.0 x 1.8 cm (AP by transverse by craniocaudad). Although somewhat difficult to be certain given motion artifact on this exam, this is favored to be intra-axial in location. Small amount of associated susceptibility artifact suggestive of blood products and/or necrosis. T2/FLAIR intensity within the adjacent right temporal lobe consistent with vasogenic edema and/or infiltrating nonenhancing tumor. No significant midline shift. Adjacent basilar cisterns remain patent. No other visible mass lesion or abnormal enhancement. No hydrocephalus. No convincing extra-axial fluid collection seen on this exam. Vascular: Major intracranial vascular flow voids are grossly maintained. Skull and upper cervical spine: Craniocervical junction within normal limits. Bone marrow signal intensity grossly normal. No visible focal marrow replacing lesion. No scalp soft tissue abnormality. Sinuses/Orbits: Globes orbital soft tissues within normal limits. Mild scattered mucosal thickening noted about the ethmoidal air cells and maxillary sinuses. Small to moderate bilateral mastoid effusions. Visualized nasopharynx grossly negative. Other: None. IMPRESSION: 1. Motion degraded exam. 2. 2.1 x 2.0 x 1.8 cm enhancing mass positioned at the medial right temporal horn. This is favored to be intra-axial in location, with primary differential considerations including a solitary intracranial metastasis versus a primary CNS neoplasm. Associated vasogenic edema and/or infiltrating  nonenhancing tumor within the adjacent right temporal lobe. 3. No other acute intracranial abnormality. 4. Underlying atrophy with chronic microvascular ischemic disease, with a small chronic right PCA distribution infarct. Electronically Signed   By: BJeannine BogaM.D.   On: 11/09/2021 05:27   EEG adult  Result Date: 11/08/2021 YLora Havens MD     11/08/2021  1:46 PM Patient Name: Russell GEORGMRN: [id number] Attending: PLora HavensReferring Physician/Provider: LKerney Elbe MD Date: 11/08/2021 Duration: 22.21 mins Patient history: 89yoM with ams. EEG to evaluate for seizure Level of alertness: Awake AEDs during EEG study: None Technical aspects: This EEG study was done with scalp electrodes positioned according to the 10-20 International system of electrode placement. Electrical activity  was reviewed with band pass filter of 1-'70Hz'$ , sensitivity of 7 uV/mm, display speed of 53m/sec with a '60Hz'$  notched filter applied as appropriate. EEG data were recorded continuously and digitally stored.  Video monitoring was available and reviewed as appropriate. Description: The posterior dominant rhythm consists of 7.5 Hz activity of moderate voltage (25-35 uV) seen predominantly in posterior head regions, symmetric and reactive to eye opening and eye closing. EEG showed intermittent generalized 3 to 6 Hz theta-delta slowing. Hyperventilation and photic stimulation were not performed.   ABNORMALITY - Intermittent slow, generalized IMPRESSION: This study is suggestive of mild diffuse encephalopathy, nonspecific etiology. No seizures or epileptiform discharges were seen throughout the recording. Priyanka OBarbra Sarks   Medications: Prior to Admission:  Medications Prior to Admission  Medication Sig Dispense Refill Last Dose   gabapentin (NEURONTIN) 300 MG capsule Take 1 capsule (300 mg total) by mouth 3 (three) times daily. 90 capsule 0 11/06/2021   sildenafil (VIAGRA) 50 MG tablet Take 1  tablet (50 mg total) by mouth daily as needed for erectile dysfunction. 15 tablet 2 unk   tadalafil (CIALIS) 20 MG tablet Take 20 mg by mouth daily as needed.   unk   atorvastatin (LIPITOR) 40 MG tablet Take 40 mg by mouth at bedtime. (Patient not taking: Reported on 11/07/2021)   Not Taking   ciprofloxacin (CIPRO) 500 MG tablet Take 500 mg by mouth 2 (two) times daily. (Patient not taking: Reported on 11/07/2021)   Not Taking   insulin glargine-yfgn (SEMGLEE) 100 UNIT/ML injection Inject 20 Units into the skin See admin instructions. Inject 20 units under skin at bedtime for diabetes mellitus do not mix with other insulins in the same syringe-discard bottle 28 days after opening **replaces Lantus** (Patient not taking: Reported on 11/07/2021)   Not Taking   JANUMET 50-500 MG tablet Take 1 tablet by mouth 2 (two) times daily. (Patient not taking: Reported on 11/07/2021)   Not Taking   meloxicam (MOBIC) 15 MG tablet Take 15 mg by mouth daily as needed. (Patient not taking: Reported on 11/07/2021)   Not Taking   metFORMIN (GLUCOPHAGE) 500 MG tablet TAKE ONE TABLET BY MOUTH TWO TIMES A DAY FOR DIABETES (Patient not taking: Reported on 11/07/2021)   Not Taking   sertraline (ZOLOFT) 50 MG tablet Take 50 mg by mouth at bedtime. (Patient not taking: Reported on 11/07/2021)   Not Taking   traMADol (ULTRAM) 50 MG tablet Take 1 tablet (50 mg total) by mouth every 6 (six) hours as needed. (Patient not taking: Reported on 11/07/2021) 20 tablet 0 Not Taking   Scheduled:  carvedilol  3.125 mg Oral BID WC   feeding supplement  237 mL Oral BID BM   furosemide  40 mg Intravenous Q12H   insulin aspart  0-15 Units Subcutaneous TID WC   insulin aspart  0-5 Units Subcutaneous QHS   nicotine  21 mg Transdermal Daily   thiamine (VITAMIN B1) injection  100 mg Intravenous Daily   Or   thiamine  100 mg Oral Daily   Continuous:  sodium chloride 50 mL/hr at 11/10/21 0318   acyclovir (ZOVIRAX) 900 mg in dextrose 5 % 250  mL IVPB 900 mg (11/10/21 0553)   Assessment: 66y.o. male with PMH significant for coronary artery disease, chronic lower back pain, CKD, cocaine abuse, diabetes mellitus type 2, ischemic cardiomyopathy, sleep apnea, HLD, HTN who presents with back and genital pain, shortness of breath, noted to be acting inappropriately and has been uncooperative with  some exams. Brain imaging reveals probable right temporal lobe mass.  - Initially, differential diagnosis was more broad, including that of HSV encephalitis for which he was started on acyclovir. MRI brain WITH CONTRAST aided in characterization, clearly appearing as mass lesion with vasogenic edema. Furthermore, no fevers, leukocytosis only to 11, no headaches. EEG not suggestive of temporal lobe dysfunction that would be expected with HSV encephalitis. Thus, this diagnosis much less likely. - DDx primary brain tumor versus CNS metastatic disease, workup of lung nodules pending.  - EEG with diffuse slowing indicative of generalized encephalopathy rather than focal nidus for seizure activity.  - Other workup has included UDS (neg), ammonia level, elevated BNP. - TTE revealing Left ventricular ejection fraction, by estimation, is 25 to 30%. The left ventricle has severely decreased function. The left ventricle demonstrates regional wall motion abnormalities (LAD and RCA infarct pattern). There is moderate asymmetric left ventricular hypertrophy of the septal segment. Left ventricular diastolic parameters are consistent with Grade III diastolic dysfunction (restrictive).  - Low normal folate, normal B12 in 700s, TSH wnl, negative HIV antibodies, negative UA.  - CT chest reveals pathologic mediastinal adenopathy, new since prior examination, suspicious for a lymphoproliferative process. Thus, leading ddx is metastatic disease from primary lung cancer, being worked up by primary team.    Recommendations  - Agree with neurosurgery about characterization of  tumor, likely intra-axial. Also agree that lung pathology needs to be worked up first.  - No evidence ischemic or hemorrhagic byproducts on MRI, thus no contraindication for chemical DVT ppx , continue SCDs - Discontinue acyclovir unless ID has objection for concomitant HSV in addition to aforementioned mass lesion. - Doubt that patient would consent to LP or that one is necessary or safe given the mass lesion.   - Vit B1 levels are pending. Continue thiamine 100 mg daily  - Neurology will sign off. Please call us with any additional questions or concerns  Lennie Hummer, [facility] Pager # [phone]    LOS: 3 days   '@Electronically'$  signed: Dr. Kerney Elbe 11/10/2021  8:54 AM

## 2021-11-10 NOTE — Progress Notes (Signed)
Progress Note   Patient: [name] TGY:[id number] DOB: 1955/05/28 DOA: 11/07/2021     3 DOS: the patient was seen and examined on 11/10/2021   Brief hospital course: [name] was admitted to the hospital with the working diagnosis of acute metabolic encephalopathy.   66 yo male with the past medical history of coronary artery disease, CKD, cocaine use, T2DM and sleep apnea who presented with back pain. Few days of back and testicular pain. At home patient had a fall with head trauma. On his initial physical examination his blood pressure was 160/110. HR 84, RR 20 and 02 saturation 95%, lungs with wheezing, heart with S1 and S2 present and rhythmic, abdomen with no distention or tenderness, no lower extremity edema.   Head CT with hypodensity in the right temporal lobe, which is new from the prior examination.  Patient was transferred to [facility] for brain MRI.  MRI of the brain with contrast suggested extra-axial mass concerning for meningioma versus dural metastases.  CT chest abdomen pelvis had shown pleural effusion, ascites and mediastinal lymphadenopathy therefore pulmonary consulted for their input.  11/01 thoracentesis 850 cc obtained.     Assessment and Plan: * Acute respiratory failure with hypoxia ([place]) - Most likely secondary to CHF - See plan for CHF exacerbation  Acute metabolic encephalopathy Multifactorial encephalopathy clinically has improved Patient with no clinical signs of viral encephalitis   Plan to discontinue acyclovir Continue neuro checks per unit protocol Pt and Ot Possible dc home tomorrow    Acute on chronic systolic CHF (congestive heart failure) ([place]) Patient with no signs of volume overload Continue blood pressure monitoring  Exacerbation has improved.  Transition to oral furosemide   Elevated troponin due to heart failure exacerbation.   Acute renal failure superimposed on stage 3b chronic kidney disease ([place]) Renal function with serum  cr at 1.53 with K at 3,6 and serum bicarbonate at 29 Plan to continue close follow up renal function and electrolytes.   Protein calorie malnutrition ([facility]) - Albumin 2.7 - Encourage nutrient dense food choices  Stroke (cerebrum) ([place]) Patient ruled out for acute CVA Continue blood pressure monitoring    Tobacco use disorder - Patient is a current smoker - Nicotine patch ordered - Counseled patient on the importance of cessation but he has no interest in quitting at this time  Uncontrolled type 2 diabetes mellitus with hyperglycemia ([place]) Continue glucose cover and monitoring with insulin sliding scale.  Patient is tolerating po well.   Elevated troponin - Troponin 31 - In the setting of CHF - Likely demand ischemia - Trend troponin - EKG shows a heart rate of 86, sinus rhythm, QTc 555 without any acute ischemic changes - Continue to monitor  Claudication in peripheral vascular disease [facility]) - Patient had vascular surgery last year per his report - CT scan shows occlusion of the left common internal and proximal iliac artery.  There is reconstitution of the external on the CTA - Consult vascular surgery - When med rec is complete, add back home medications - Continue to monitor        Subjective: Patient is feeling well, no confusion or agitation, had thoracentesis today with good toleration   Physical Exam: Vitals:   11/10/21 1333 11/10/21 1419 11/10/21 1435 11/10/21 1548  BP: 90/68 (!) 89/74 106/81 108/77  Pulse:  69  73  Resp: '19 19  17  '$ Temp: 97.6 F (36.4 C)   98.4 F (36.9 C)  TempSrc: Temporal   Oral  SpO2: 92% 100% 100% 100%  Weight:      Height:       Neurology awake and alert ENT with mild pallor Cardiovascular with S1 and S2 present and rhythmic Respiratory with no rales or wheezing, mild decreased breath sounds at the dependent zones  Abdomen with no distention No lower extremity edema  Data Reviewed:    Family Communication: no family at  the bedside   Disposition: Status is: Inpatient Remains inpatient appropriate because: neurologic and respiratory monitoring   Planned Discharge Destination: Home    Author: Tawni Millers, MD 11/10/2021 4:47 PM  For on call review [email or web address].

## 2021-11-10 NOTE — Assessment & Plan Note (Addendum)
Echocardiogram with reduced LV systolic function with EF 25 to 30%, asymmetric left ventricular hypertrophy of the basal segment. Interventricular septum flattened in systole consistent with right ventricular pressure overload. RVSP 44.8  Left ventricle entire apex and mid inferoseptal segment are akinetic, the mid anteroseptal segment, mid inferolateral segment, midl anterolateral segment, mid anterior segment and mid inferior segment are hypokinetic. Trivial pericardial effusion.   Troponin elevation flat not consistent with acute coronary syndrome EKG with old ischemic changes.   Last visit with cardiology 09/2021 documented left apical thrombus. Patient not interested in taking cardiac medications.   Patient was placed on furosemide for diuresis, negative fluid balance was achieved, losing 5 kg since admission.   Patient will continue taking carvedilol and daily furosemide 40 mg. Currently not on RAS inhibition for now due to reduced GFR Follow up as outpatient.

## 2021-11-10 NOTE — Progress Notes (Signed)
Physical Therapy Treatment Patient Details Name: [name] MRN: [id number] DOB: [birth year] Today's Date: 11/10/2021   History of Present Illness Pt presented to [facility] on 11/07/21 back pain, confusion and falls. Pt with hallucinations and inappropriate behavior towards staff. CT showed hypodensity in the rt temporal lobe. Transferred to [facility] on 10/29 for MRI.  MRI showed possible mass rt temporal lobe, suspected trace SDH rt temporal horn, and possible small acute or subacute infarct lt temporal lobe. Pt needs repeat MRI due to motion artifact. Pt with chest pain in early AM of 10/30 and found to have 1st degree heart block. PMH - CAD, cocaine abuse, ckd, chronic back pain, DM, sleep apnea, chf, pvd, medical noncompliance (has refused cardiac medications).    PT Comments    Pt more interactive and participatory with therapy compared to eval. Pt with some balance deficits with incr mobility and gait. Recommend pt use his rollator for ambulation. Pt is considering staying with his uncle [name] at [place]  which I think would be a good idea. Will continue to follow.   Recommendations for follow up therapy are one component of a multi-disciplinary discharge planning process, led by the attending physician.  Recommendations may be updated based on patient status, additional functional criteria and insurance authorization.  Follow Up Recommendations  Home health PT     Assistance Recommended at Discharge Intermittent Supervision/Assistance  Patient can return home with the following Assist for transportation;Help with stairs or ramp for entrance;Assistance with cooking/housework;A little help with walking and/or transfers   Equipment Recommendations  None recommended by PT    Recommendations for Other Services       Precautions / Restrictions Precautions Precautions: Fall Restrictions Weight Bearing Restrictions: No     Mobility  Bed Mobility Overal bed mobility: Modified Independent                   Transfers Overall transfer level: Needs assistance Equipment used: None, Rollator (4 wheels) Transfers: Sit to/from Stand Sit to Stand: Min guard           General transfer comment: Assist for safety. Pt braces posterior legs against bed for balance    Ambulation/Gait Ambulation/Gait assistance: Min guard, Min assist Gait Distance (Feet): 120 Feet Assistive device: Rollator (4 wheels) Gait Pattern/deviations: Step-through pattern, Decreased stride length, Staggering left Gait velocity: decr Gait velocity interpretation: 1.31 - 2.62 ft/sec, indicative of limited community ambulator   General Gait Details: Min guard for safety and min assist to correct one loss of balance with stagger   Stairs             Wheelchair Mobility    Modified Rankin (Stroke Patients Only)       Balance Overall balance assessment: Needs assistance, History of Falls Sitting-balance support: No upper extremity supported, Feet supported Sitting balance-Leahy Scale: Good     Standing balance support: No upper extremity supported, During functional activity Standing balance-Leahy Scale: Fair                              Cognition Arousal/Alertness: Awake/alert Behavior During Therapy: WFL for tasks assessed/performed Overall Cognitive Status: No family/caregiver present to determine baseline cognitive functioning Area of Impairment: Safety/judgement                         Safety/Judgement: Decreased awareness of safety     General Comments: May be baseline  Exercises      General Comments General comments (skin integrity, edema, etc.): Pt on 4L O2 at rest with SpO2 98%. Removed O2 with SpO2 decr to 86%. Replaced O2.      Pertinent Vitals/Pain Pain Assessment Pain Assessment: No/denies pain    Home Living                          Prior Function            PT Goals (current goals can now be found in the care  plan section) Progress towards PT goals: Progressing toward goals    Frequency    Min 3X/week      PT Plan Discharge plan needs to be updated    Co-evaluation              AM-PAC PT "6 Clicks" Mobility   Outcome Measure  Help needed turning from your back to your side while in a flat bed without using bedrails?: None Help needed moving from lying on your back to sitting on the side of a flat bed without using bedrails?: None Help needed moving to and from a bed to a chair (including a wheelchair)?: A Little Help needed standing up from a chair using your arms (e.g., wheelchair or bedside chair)?: A Little Help needed to walk in hospital room?: A Little Help needed climbing 3-5 steps with a railing? : A Little 6 Click Score: 20    End of Session Equipment Utilized During Treatment: Oxygen Activity Tolerance: Patient tolerated treatment well Patient left: in chair;with chair alarm set;with call bell/phone within reach Nurse Communication: Mobility status PT Visit Diagnosis: Unsteadiness on feet (R26.81);History of falling (Z91.81)     Time: 4270-6237 PT Time Calculation (min) (ACUTE ONLY): 18 min  Charges:  $Gait Training: 8-22 mins                     [name] 11/10/2021, 11:34 AM

## 2021-11-10 NOTE — Hospital Course (Addendum)
Mr. Letizia was admitted to the hospital with the working diagnosis of acute metabolic encephalopathy.   66 yo male with the past medical history of coronary artery disease, CKD, cocaine use, T2DM and sleep apnea who presented with back pain. Few days of back and testicular pain. At home patient had a fall with head trauma. On his initial physical examination his blood pressure was 160/110. HR 84, RR 20 and 02 saturation 95%, lungs with wheezing, heart with S1 and S2 present and rhythmic, abdomen with no distention or tenderness, no lower extremity edema. He had slurred speech and moving all 4 extremities.   Na 139, K 3,7 CL 107 bicarbonate 25 glucose 208 bun 18 cr 1,57 BNP 1,157  High sensitive troponin 31, 38, 31, 32  Wbc 11,0 hgb 11,8 plt 265  Urine analysis SG 1,027, >300 protein, negative leukocytes.  Toxicology negative   Chest radiograph with cardiomegaly, bilateral hilar vascular congestion, right pleural effusion CT chest/ abdomen and pelvis with bilateral ground glass opacities with right pleural effusion. Pathological mediastinal adenopathy, new since prior examination, suspicious for lymphoproliferative process.  Mild ascites and diffuse subcutaneous edema within the abdominal wall in keeping with mild anasarca.   EKG 86 bpm, left axis deviation, left anterior fascicular block, qtc 555, sinus rhythm with PAC, positive q wave V1 to V5, no significant ST segment or T wave changes, low voltage.   Head CT with hypodensity in the right temporal lobe, which is new from the prior examination.  Patient was transferred to [facility] for brain MRI.  MRI of the brain with contrast suggested extra-axial mass concerning for meningioma versus dural metastases.    11/01 thoracentesis 850 cc obtained.  11/03 Flexible bronchoscopy and EBUS Bronchoscopy

## 2021-11-10 NOTE — Op Note (Signed)
Thoracentesis  Procedure Note  [name]  [id number]  03-Feb-1955  Date:11/10/21  Time:2:19 PM   Provider Performing:Ociel Retherford S Reese Stockman   Procedure: Thoracentesis with imaging guidance (88916)  Indication(s) Pleural Effusion  Consent Risks of the procedure as well as the alternatives and risks of each were explained to the patient and/or caregiver.  Consent for the procedure was obtained and is signed in the bedside chart  Anesthesia Topical only with 1% lidocaine    Time Out Verified patient identification, verified procedure, site/side was marked, verified correct patient position, special equipment/implants available, medications/allergies/relevant history reviewed, required imaging and test results available.   Sterile Technique Maximal sterile technique including full sterile barrier drape, hand hygiene, sterile gown, sterile gloves, mask, hair covering, sterile ultrasound probe cover (if used).  Procedure Description Ultrasound was used to identify appropriate pleural anatomy for placement and overlying skin marked.  Area of drainage cleaned and draped in sterile fashion. Lidocaine was used to anesthetize the skin and subcutaneous tissue.  850 cc's of clear yellow appearing fluid was drained from the right pleural space. Catheter then removed and bandaid applied to site.   Complications/Tolerance None; patient tolerated the procedure well. Chest X-ray is ordered to confirm no post-procedural complication.   EBL None   Specimen(s) Pleural fluid for protein, LDH, cell count and differential, culture, cytology   Baltazar Apo, MD, PhD 11/10/2021, 2:20 PM  Pulmonary and Critical Care [phone] or if no answer before 7:00PM call [phone] For any issues after 7:00PM please call eLink [phone]

## 2021-11-10 NOTE — Progress Notes (Signed)
OT Cancellation Note  Patient Details Name: [name] MRN: [id number] DOB: 03-19-1955   Cancelled Treatment:    Reason Eval/Treat Not Completed: Other (comment) Went in to talk with pt and work in therapy.  Shortly after,  pt's "Pop" came in to visit.  Therapist knew from previous notes that MD would need to talk with family to help understand situation better and get consent for upcoming procedures.  Discussed with pt and family to continue visit with each other and therapist would let nursing know so MD could talk with family and pt.  Therapist would check back later.  Now at this time pt is in procedure, so will hold off OT until tomorrow if medically OK to participate.    Victorine Mcnee OTR/L 11/10/2021, 2:12 PM

## 2021-11-10 NOTE — Plan of Care (Signed)
  Problem: Education: Goal: Ability to demonstrate management of disease process will improve Outcome: Progressing Goal: Ability to verbalize understanding of medication therapies will improve Outcome: Progressing Goal: Individualized Educational Video(s) Outcome: Progressing   Problem: Activity: Goal: Capacity to carry out activities will improve Outcome: Progressing   Problem: Cardiac: Goal: Ability to achieve and maintain adequate cardiopulmonary perfusion will improve Outcome: Progressing   Problem: Education: Goal: Knowledge of disease or condition will improve Outcome: Progressing Goal: Knowledge of secondary prevention will improve (MUST DOCUMENT ALL) Outcome: Progressing Goal: Knowledge of patient specific risk factors will improve Elta [place] N/A or DELETE if not current risk factor) Outcome: Progressing   Problem: Ischemic Stroke/TIA Tissue Perfusion: Goal: Complications of ischemic stroke/TIA will be minimized Outcome: Progressing   Problem: Coping: Goal: Will verbalize positive feelings about self Outcome: Progressing Goal: Will identify appropriate support needs Outcome: Progressing   Problem: Health Behavior/Discharge Planning: Goal: Ability to manage health-related needs will improve Outcome: Progressing Goal: Goals will be collaboratively established with patient/family Outcome: Progressing   Problem: Self-Care: Goal: Ability to participate in self-care as condition permits will improve Outcome: Progressing Goal: Verbalization of feelings and concerns over difficulty with self-care will improve Outcome: Progressing Goal: Ability to communicate needs accurately will improve Outcome: Progressing   Problem: Nutrition: Goal: Risk of aspiration will decrease Outcome: Progressing Goal: Dietary intake will improve Outcome: Progressing   Problem: Education: Goal: Knowledge of General Education information will improve Description: Including pain rating scale,  medication(s)/side effects and non-pharmacologic comfort measures Outcome: Progressing   Problem: Health Behavior/Discharge Planning: Goal: Ability to manage health-related needs will improve Outcome: Progressing   Problem: Clinical Measurements: Goal: Ability to maintain clinical measurements within normal limits will improve Outcome: Progressing Goal: Will remain free from infection Outcome: Progressing Goal: Diagnostic test results will improve Outcome: Progressing Goal: Respiratory complications will improve Outcome: Progressing Goal: Cardiovascular complication will be avoided Outcome: Progressing   Problem: Activity: Goal: Risk for activity intolerance will decrease Outcome: Progressing   Problem: Nutrition: Goal: Adequate nutrition will be maintained Outcome: Progressing   Problem: Coping: Goal: Level of anxiety will decrease Outcome: Progressing   Problem: Elimination: Goal: Will not experience complications related to bowel motility Outcome: Progressing Goal: Will not experience complications related to urinary retention Outcome: Progressing   Problem: Pain Managment: Goal: General experience of comfort will improve Outcome: Progressing   Problem: Safety: Goal: Ability to remain free from injury will improve Outcome: Progressing   Problem: Skin Integrity: Goal: Risk for impaired skin integrity will decrease Outcome: Progressing

## 2021-11-11 DIAGNOSIS — G9341 Metabolic encephalopathy: Secondary | ICD-10-CM | POA: Diagnosis not present

## 2021-11-11 DIAGNOSIS — I5023 Acute on chronic systolic (congestive) heart failure: Secondary | ICD-10-CM | POA: Diagnosis not present

## 2021-11-11 DIAGNOSIS — I2510 Atherosclerotic heart disease of native coronary artery without angina pectoris: Secondary | ICD-10-CM | POA: Diagnosis not present

## 2021-11-11 DIAGNOSIS — N179 Acute kidney failure, unspecified: Secondary | ICD-10-CM | POA: Diagnosis not present

## 2021-11-11 DIAGNOSIS — R590 Localized enlarged lymph nodes: Secondary | ICD-10-CM | POA: Diagnosis not present

## 2021-11-11 LAB — BASIC METABOLIC PANEL
Anion gap: 9 (ref 5–15)
BUN: 26 mg/dL — ABNORMAL HIGH (ref 8–23)
CO2: 29 mmol/L (ref 22–32)
Calcium: 8.1 mg/dL — ABNORMAL LOW (ref 8.9–10.3)
Chloride: 100 mmol/L (ref 98–111)
Creatinine, Ser: 1.82 mg/dL — ABNORMAL HIGH (ref 0.61–1.24)
GFR, Estimated: 40 mL/min — ABNORMAL LOW (ref >60)
Glucose, Bld: 168 mg/dL — ABNORMAL HIGH (ref 70–99)
Potassium: 3.7 mmol/L (ref 3.5–5.1)
Sodium: 138 mmol/L (ref 135–145)

## 2021-11-11 LAB — GLUCOSE, CAPILLARY
Glucose-Capillary: 169 mg/dL — ABNORMAL HIGH (ref 70–99)
Glucose-Capillary: 201 mg/dL — ABNORMAL HIGH (ref 70–99)
Glucose-Capillary: 191 mg/dL — ABNORMAL HIGH (ref 70–99)
Glucose-Capillary: 182 mg/dL — ABNORMAL HIGH (ref 70–99)
Glucose-Capillary: 138 mg/dL — ABNORMAL HIGH (ref 70–99)
Glucose-Capillary: 189 mg/dL — ABNORMAL HIGH (ref 70–99)

## 2021-11-11 LAB — SARS CORONAVIRUS 2 BY RT PCR: SARS Coronavirus 2 by RT PCR: NEGATIVE

## 2021-11-11 MED ORDER — FUROSEMIDE 10 MG/ML IJ SOLN
60.0000 mg | Freq: Two times a day (BID) | INTRAMUSCULAR | Status: DC
  Administered 2021-11-11 – 2021-11-12 (×3): 60 mg via INTRAVENOUS
  Filled 2021-11-11 (×3): qty 6

## 2021-11-11 NOTE — H&P (View-Only) (Signed)
NAME:  [name], MRN:  [id number], DOB:  [birth year], LOS: 4 ADMISSION DATE:  11/07/2021, CONSULTATION DATE:  11/08/21 REFERRING MD:  Reesa Chew [facility]), CHIEF COMPLAINT:  pleural effusion   History of Present Illness:  66yo male smoker with hx polysubstance abuse, CAD, CKD, HTN, OSA initially admitted 10/29 with back pain. W/u included MRI which revealed probable R temporal lobe mass as well as pleural effusions and mediastinal adenopathy. Nsgy concerned that temporal lobe mass is dural- based metastasis, so PCCM consulted to eval mediastinal adenopathy as w/u for primary malignancy.   Of note pt has had odd behavior, attempting to leave AMA, uncooperative with staff, refusing medications and procedures.   Pertinent  Medical History   has a past medical history of Bulging lumbar disc, CAD (coronary artery disease) ([id number]), Chronic lower back pain, CKD (chronic kidney disease), stage II, Cocaine abuse ([place]), DM2 (diabetes mellitus, type 2) ([place]), Essential hypertension, GERD (gastroesophageal reflux disease), Headache, History of pneumonia, Hyperlipidemia, Ischemic cardiomyopathy, LV (left ventricular) mural thrombus, and Sleep apnea.   Significant Hospital Events: Including procedures, antibiotic start and stop dates in addition to other pertinent events   CT chest/abd/pelvis 10/29>>> 1. No acute intrathoracic or intra-abdominal injury. 2. Pathologic mediastinal adenopathy, new since prior examination, suspicious for a lymphoproliferative process. PET CT examination or short-term follow-up CT examination in 3 months once the patient's acute issues have resolved may be helpful for further evaluation. 3. Mild to moderate pulmonary edema, possibly cardiogenic in nature. Large right and small left pleural effusions. Mild ascites and diffuse subcutaneous edema within the abdominal wall in keeping with mild anasarca. 4. Mild cardiomegaly. Multi-vessel coronary artery stenting. 5. Peripheral  vascular disease with occlusion of the left common, internal, and proximal external iliac arteries. Reconstitution of the left external iliac artery via the inferior epigastric artery. 6. 3.3 cm fusiform right common iliac artery aneurysm. MR brain 10/30>>>1. Motion degraded exam. 2. 2.1 x 2.0 x 1.8 cm enhancing mass positioned at the medial right temporal horn. This is favored to be intra-axial in location, with primary differential considerations including a solitary intracranial metastasis versus a primary CNS neoplasm. Associated vasogenic edema and/or infiltrating nonenhancing tumor within the adjacent right temporal lobe. 3. No other acute intracranial abnormality. 4. Underlying atrophy with chronic microvascular ischemic disease, with a small chronic right PCA distribution infarct. Interim History / Subjective:  Postthoracentesis 11/10/2021  Objective   Blood pressure 117/72, pulse 72, temperature 98.3 F (36.8 C), temperature source Oral, resp. rate 18, height '5\' 9"'$  (1.753 m), weight 92.1 kg, SpO2 98 %.        Intake/Output Summary (Last 24 hours) at 11/11/2021 0939 Last data filed at 11/10/2021 1943 Gross per 24 hour  Intake 892.42 ml  Output --  Net 892.42 ml   Filed Weights   11/09/21 0442 11/10/21 0500 11/11/21 0500  Weight: 102 kg 91.1 kg 92.1 kg    Examination: General: 66 year old male no acute distress HEENT: MM pink/moist no JVD Neuro: Grossly intact CV: Heart sounds are regular PULM: Decreased breath sounds in the bases GI: soft, bsx4 active  GU: Voids Extremities: warm/dry, negative edema  Skin: no rashes or lesions    Assessment & Plan:  Mediastinal lymphadenopathy  Pleural effusion  ?Temporal metastasis  Acute on chronic systolic/diastolic heart failure with LVEF 30 % and Grade 3 diastolic dysfunction   Post thoracentesis from 11/10/2021 with 850 cc obtained. Cytology pending Questionable need for EBUS Continue to follow      Best  Practice (  right click and "Reselect all SmartList Selections" daily)  Per primary team  Labs   CBC: Recent Labs  Lab 11/07/21 0141 11/10/21 0347  WBC 11.0* 9.0  HGB 11.8* 12.8*  HCT 38.6* 41.9  MCV 81.4 79.7*  PLT 265 174    Basic Metabolic Panel: Recent Labs  Lab 11/07/21 0141 11/10/21 0347 11/11/21 0332  NA 139 138 138  K 3.7 3.6 3.7  CL 107 101 100  CO2 '25 29 29  '$ GLUCOSE 208* 111* 168*  BUN 18 21 26*  CREATININE 1.57* 1.53* 1.82*  CALCIUM 8.1* 8.1* 8.1*  MG  --  1.8  --    GFR: Estimated Creatinine Clearance: 44.2 mL/min (A) (by C-G formula based on SCr of 1.82 mg/dL (H)). Recent Labs  Lab 11/07/21 0141 11/10/21 0347  WBC 11.0* 9.0    Liver Function Tests: Recent Labs  Lab 11/07/21 0141  AST 25  ALT 24  ALKPHOS 30*  BILITOT 0.6  PROT 7.5  ALBUMIN 2.7*   Recent Labs  Lab 11/07/21 0141  LIPASE 24   Recent Labs  Lab 11/08/21 0556  AMMONIA 19    ABG    Component Value Date/Time   HCO3 34.5 (H) 11/07/2021 0833   TCO2 26 11/03/2016 1727   O2SAT 16.8 11/07/2021 0833     Coagulation Profile: No results for input(s): "INR", "PROTIME" in the last 168 hours.  Cardiac Enzymes: No results for input(s): "CKTOTAL", "CKMB", "CKMBINDEX", "TROPONINI" in the last 168 hours.  HbA1C: Hgb A1c MFr Bld  Date/Time Value Ref Range Status  11/10/2021 03:47 AM 7.3 (H) 4.8 - 5.6 % Final    Comment:    (NOTE) Pre diabetes:          5.7%-6.4%  Diabetes:              >6.4%  Glycemic control for   <7.0% adults with diabetes   12/11/2020 12:18 PM 9.9 (H) 4.8 - 5.6 % Final    Comment:    (NOTE) Pre diabetes:          5.7%-6.4%  Diabetes:              >6.4%  Glycemic control for   <7.0% adults with diabetes     CBG: Recent Labs  Lab 11/10/21 1550 11/10/21 2034 11/11/21 0113 11/11/21 0405 11/11/21 0841  GLUCAP 198* 197* 169* 201* 191*   Steve Samir Ishaq ACNP Acute Care Nurse Practitioner [place] Please consult  Amion 11/11/2021, 9:39 AM

## 2021-11-11 NOTE — Progress Notes (Signed)
Progress Note   Patient: [name] XTG:[id number] DOB: 23-Jul-1955 DOA: 11/07/2021     4 DOS: the patient was seen and examined on 11/11/2021   Brief hospital course: Mr. Corales was admitted to the hospital with the working diagnosis of acute metabolic encephalopathy.   66 yo male with the past medical history of coronary artery disease, CKD, cocaine use, T2DM and sleep apnea who presented with back pain. Few days of back and testicular pain. At home patient had a fall with head trauma. On his initial physical examination his blood pressure was 160/110. HR 84, RR 20 and 02 saturation 95%, lungs with wheezing, heart with S1 and S2 present and rhythmic, abdomen with no distention or tenderness, no lower extremity edema. He had slurred speech and moving all 4 extremities.   Na 139, K 3,7 CL 107 bicarbonate 25 glucose 208 bun 18 cr 1,57 BNP 1,157  High sensitive troponin 31, 38, 31, 32  Wbc 11,0 hgb 11,8 plt 265  Urine analysis SG 1,027, >300 protein, negative leukocytes.  Toxicology negative   Chest radiograph with cardiomegaly, bilateral hilar vascular congestion, right pleural effusion CT chest/ abdomen and pelvis with bilateral ground glass opacities with right pleural effusion. Pathological mediastinal adenopathy, new since prior examination, suspicious for lymphoproliferative process.  Mild ascites and diffuse subcutaneous edema within the abdominal wall in keeping with mild anasarca.   EKG 86 bpm, left axis deviation, left anterior fascicular block, qtc 555, sinus rhythm with PAC, positive q wave V1 to V5, no significant ST segment or T wave changes, low voltage.   Head CT with hypodensity in the right temporal lobe, which is new from the prior examination.  Patient was transferred to [facility] for brain MRI.  MRI of the brain with contrast suggested extra-axial mass concerning for meningioma versus dural metastases.    11/01 thoracentesis 850 cc obtained.     Assessment and  Plan: Acute metabolic encephalopathy Multifactorial encephalopathy clinically has improved Patient with no clinical signs of viral encephalitis  Antiviral therapy has been discontinued Continue to encourage mobility.  Continue with thiamine.  Patient will need home PT    Acute on chronic systolic CHF (congestive heart failure) (HCC) Echocardiogram with reduced LV systolic function with EF 25 to 30%, asymmetric left ventricular hypertrophy of the basal segment. Interventricular septum flattened in systole consistent with right ventricular pressure overload. RVSP 44.8  Left ventricle entire apex and mid inferoseptal segment are akinetic, the mid anteroseptal segment, mid inferolateral segment, midl anterolateral segment, mid anterior segment and mid inferior segment are hypokinetic. Trivial pericardial effusion.   Troponin elevation flat not consistent with acute coronary syndrome EKG with old ischemic changes.   Last visit with cardiology 09/2021 documented left apical thrombus. Patient not interested in taking cardiac medications.   Coronary artery disease Patient had LAD, RCA drug eluding stents placed, (2017 and 2021)  He has refused to take cardiac medications.  Currently with no chest pain,   Acute respiratory failure with hypoxia (HCC) - Most likely secondary to CHF - See plan for CHF exacerbation  Acute renal failure superimposed on stage 3b chronic kidney disease ([place]) Patient with volume overload, improved but not back to baseline Serum cr today is 1,82 with K at 3,7 and serum bicarbonate at 29. Plan to continue diuresis with furosemide, follow up renal function in am.   Protein calorie malnutrition (HCC) - Albumin 2.7 - Encourage nutrient dense food choices  Stroke (cerebrum) [facility]) Patient ruled out for acute CVA Continue  blood pressure monitoring    Tobacco use disorder - Patient is a current smoker - Nicotine patch ordered - Counseled patient on the  importance of cessation but he has no interest in quitting at this time  Uncontrolled type 2 diabetes mellitus with hyperglycemia (HCC) Continue glucose cover and monitoring with insulin sliding scale.  Patient is tolerating po well.   Elevated troponin - Troponin 31 - In the setting of CHF - Likely demand ischemia - Trend troponin - EKG shows a heart rate of 86, sinus rhythm, QTc 555 without any acute ischemic changes - Continue to monitor  Claudication in peripheral vascular disease [facility]) - Patient had vascular surgery last year per his report - CT scan shows occlusion of the left common internal and proximal iliac artery.  There is reconstitution of the external on the CTA - Consult vascular surgery - When med rec is complete, add back home medications - Continue to monitor        Subjective: Patient with no chest pain, mild dyspnea, continue to have lower extremity edema   Physical Exam: Vitals:   11/10/21 2343 11/11/21 0420 11/11/21 0500 11/11/21 0900  BP: 95/65 103/69  117/72  Pulse: 65 67  72  Resp: '17 18  18  '$ Temp: 97.7 F (36.5 C) 97.9 F (36.6 C)  98.3 F (36.8 C)  TempSrc: Oral Oral  Oral  SpO2: 99% 97%  98%  Weight:   92.1 kg   Height:       Neurology awake and alert ENT with mild pallor Cardiovascular with S1 and S2 present and rhythmic with no gallops or murmurs Respiratory with mild rales at bases with no wheezing Abdomen with no distention  Positive lower extremity edema ++  Data Reviewed:    Family Communication: no family at the bedside. I spoke with his daughter over the phone and all questions were addresses  Disposition: Status is: Inpatient Remains inpatient appropriate because: heart failure and volume overload with renal failure   Planned Discharge Destination: Home      Author: Tawni Millers, MD 11/11/2021 10:06 AM  For on call review [email or web address].

## 2021-11-11 NOTE — Assessment & Plan Note (Addendum)
Patient had LAD, RCA drug eluding stents placed, (2017 and 2021)  He has refused to take cardiac medications as outpatient.   Currently with no chest pain.  Will recommend patient to take aspirin and statin therapy and follow up with Dr Farris Has as outpatient.

## 2021-11-11 NOTE — Progress Notes (Signed)
Mobility Specialist Progress Note:   11/11/21 1020  Mobility  Activity Ambulated with assistance in room;Transferred to/from [facility]  Level of Assistance Minimal assist, patient does 75% or more  Assistive Device Front wheel walker  Distance Ambulated (ft) 10 ft  Activity Response Tolerated well  Mobility Referral Yes  $Mobility charge 1 Mobility   Pt reluctant to participate with mobility session today. Required up to minA for LOB with RW. Pt assisted with bath, back in bed with all needs met. Will f/u for hopeful ambulation.  Nelta Numbers Acute Rehab Secure Chat or Office Phone: [phone]

## 2021-11-11 NOTE — Progress Notes (Signed)
Heart Failure Navigator Progress Note  Assessed for Heart & Vascular TOC clinic readiness.  Patient admitted with Acute respiratory failure, acute metabolic encephalopathy, MRI of brain showed ? Mass. Per Dr. Cathlean Sauer No HF TOC .    Earnestine Leys, BSN, Clinical cytogeneticist Only

## 2021-11-11 NOTE — Progress Notes (Signed)
Occupational Therapy Treatment Patient Details Name: [name] MRN: [id number] DOB: 10-21-1955 Today's Date: 11/11/2021   History of present illness Pt [facility] to [facility] back pain, confusion and falls. Pt with hallucinations and inappropriate behavior towards staff. CT showed hypodensity in the rt temporal lobe. Transferred to [facility] on 10/29 for MRI.  MRI showed possible mass rt temporal lobe, suspected trace SDH rt temporal horn, and possible small acute or subacute infarct lt temporal lobe. Pt needs repeat MRI due to motion artifact. Pt with chest pain in early AM of 10/30 and found to have 1st degree heart block. PMH - CAD, cocaine abuse, ckd, chronic back pain, DM, sleep apnea, chf, pvd, medical noncompliance (has refused cardiac medications).   OT comments  Patient presenting very close to eval status.  Safety deficits remain, but patient up and moving in the room with 2WRW and up to [facility].  Patient completed toilet transfer and stand grooming task with Min Guard at [employer] level.  Lunch tray in room, and patient wanting to try and eat.  OT to continue efforts in the acute setting with [place] recommended, if the patient agrees.  He does have assist at baseline 4x/wk, but may be getting to a point where the [place] may need to provide more at home assist.     Recommendations for follow up therapy are one component of a multi-disciplinary discharge planning process, led by the attending physician.  Recommendations may be updated based on patient status, additional functional criteria and insurance authorization.    Follow Up Recommendations  Home health OT    Assistance Recommended at Discharge Intermittent Supervision/Assistance  Patient can return home with the following  A little help with walking and/or transfers;A little help with bathing/dressing/bathroom;Assist for transportation;Assistance with cooking/housework;Help with stairs or ramp for entrance   Equipment Recommendations   None recommended by OT    Recommendations for Other Services      Precautions / Restrictions Precautions Precautions: Fall Restrictions Weight Bearing Restrictions: No       Mobility Bed Mobility Overal bed mobility: Modified Independent                  Transfers Overall transfer level: Needs assistance   Transfers: Sit to/from Stand Sit to Stand: Supervision                 Balance Overall balance assessment: Needs assistance, History of Falls Sitting-balance support: No upper extremity supported, Feet supported Sitting balance-Leahy Scale: Good     Standing balance support: Bilateral upper extremity supported Standing balance-Leahy Scale: Fair                             ADL either performed or assessed with clinical judgement   ADL   Eating/Feeding: Independent;Sitting   Grooming: Wash/dry hands;Wash/dry face;Standing;Min Geneticist, molecular: Minimal assistance;Ambulation;Regular Toilet;Grab bars;Rolling walker (2 wheels);Min guard   Toileting- Clothing Manipulation and Hygiene: Min guard;Sit to/from stand                Cognition Arousal/Alertness: Awake/alert Behavior During Therapy: WFL for tasks assessed/performed Overall Cognitive Status: No family/caregiver present to determine baseline cognitive functioning  General Comments  O2 sats dropping to 86% on RA    Pertinent Vitals/ Pain       Pain Assessment Pain Assessment: No/denies pain Pain Intervention(s): Monitored during session                                                          Frequency  Min 2X/week        Progress Toward Goals  OT Goals(current goals can now be found in the care plan section)  Progress towards OT goals: Progressing toward goals  Acute Rehab OT Goals OT Goal Formulation: With  patient Time For Goal Achievement: 11/23/21 Potential to Achieve Goals: Good  Plan Discharge plan remains appropriate    Co-evaluation                 AM-PAC OT "6 Clicks" Daily Activity     Outcome Measure   Help from another person eating meals?: None Help from another person taking care of personal grooming?: A Little Help from another person toileting, which includes using toliet, bedpan, or urinal?: A Little Help from another person bathing (including washing, rinsing, drying)?: A Little Help from another person to put on and taking off regular upper body clothing?: None Help from another person to put on and taking off regular lower body clothing?: A Little 6 Click Score: 20    End of Session Equipment Utilized During Treatment: Rolling walker (2 wheels)  OT Visit Diagnosis: Unsteadiness on feet (R26.81);Muscle weakness (generalized) (M62.81);Other symptoms and signs involving cognitive function   Activity Tolerance Patient tolerated treatment well   Patient Left in bed;with call bell/phone within reach   Nurse Communication Mobility status        Time: 4944-9675 OT Time Calculation (min): 11 min  Charges: OT General Charges $OT Visit: 1 Visit OT Treatments $Self Care/Home Management : 8-22 mins  11/11/2021  RP, OTR/L  Acute Rehabilitation Services  Office:  [phone]   Metta Clines 11/11/2021, 2:30 PM

## 2021-11-11 NOTE — TOC Initial Note (Addendum)
Transition of Care [facility]) - Initial/Assessment Note    Patient Details  Name: [name] MRN: [id number] Date of Birth: 03/24/1955  [facility] CM/SW Contact:    Cyndi Bender, RN Phone Number: 11/11/2021, 3:52 PM  Clinical Narrative:                 Spoke to patient and daughter, [name] regarding transition needs.  Patient lives with friend [name]. Patient has a rollator.  Patient states he use to have aide that came 4 days a week for 4 hours a day but that the order ended.Spoke to [place] who was patient's aide. Karie Georges  #[phone] states she works for [employer] and if orders are reinstated she can be his aide again.  This RNCM called [facility] [phone] a message was sent to the provider, Dr. Franchot Heidelberg, and nurse in regards to having the aide order reinstated and the need for home health.  Home health Choice offered to patient. Patient defers to this RNCN to find home health agency with high ratings.  Reached out to [facility] with [place] and awaiting a return call.   TOC will continue to follow for needs.   Will need home health orders for PT, OT, RN, SW  Expected Discharge Plan: [place] Barriers to Discharge: Continued Medical Work up   Patient Goals and CMS Choice Patient states their goals for this hospitalization and ongoing recovery are:: return home CMS Medicare.gov Compare Post Acute Care list provided to:: Patient Choice offered to / list presented to : Patient  Expected Discharge Plan and Services Expected Discharge Plan: [place]   Discharge Planning Services: CM Consult   Living arrangements for the past 2 months: Single Family Home                           Prior Living Arrangements/Services Living arrangements for the past 2 months: Single Family Home Lives with:: Friends [name]) Patient language and need for interpreter reviewed:: Yes Do you feel safe going back to the place where you  live?: Yes      Need for Family Participation in Patient Care: Yes (Comment) Care giver support system in place?: Yes (comment) Current home services: DME (rollator) Criminal Activity/Legal Involvement Pertinent to Current Situation/Hospitalization: No - Comment as needed  Activities of Daily Living Home Assistive Devices/Equipment: None ADL Screening (condition at time of admission) Patient's cognitive ability adequate to safely complete daily activities?: Yes Is the patient deaf or have difficulty hearing?: No Does the patient have difficulty seeing, even when wearing glasses/contacts?: No Does the patient have difficulty concentrating, remembering, or making decisions?: Yes Patient able to express need for assistance with ADLs?: Yes Does the patient have difficulty dressing or bathing?: No Independently performs ADLs?: No Communication: Independent Dressing (OT): Independent Grooming: Independent Feeding: Independent Bathing: Needs assistance Is this a change from baseline?: Change from baseline, expected to last >3 days Toileting: Independent In/Out Bed: Independent Walks in Home: Independent with device (comment) Does the patient have difficulty walking or climbing stairs?: Yes Weakness of Legs: Both Weakness of Arms/Hands: None  Permission Sought/Granted         Permission granted to share info w AGENCY: HH anf [facility]        Emotional Assessment   Attitude/Demeanor/Rapport: Engaged Affect (typically observed): Accepting Orientation: : Oriented to Self, Oriented to Place, Oriented to  Time, Oriented to Situation Alcohol /  Substance Use: Not Applicable Psych Involvement: No (comment)  Admission diagnosis:  Acute systolic heart failure (HCC) [I50.21] Confusion [R41.0] Normocytic anemia [D64.9] Renal insufficiency [N28.9] Acute respiratory failure with hypoxia ([place]) [J96.01] Patient Active Problem List   Diagnosis Date Noted   Pleural effusion 11/10/2021   Abnormal  CT of the chest 11/09/2021   Mediastinal adenopathy 11/09/2021   Acute respiratory failure with hypoxia ([place]) 11/07/2021   Stroke (cerebrum) ([place]) 11/07/2021   Elevated troponin 69/62/9528   Acute metabolic encephalopathy 41/32/4401   Protein calorie malnutrition ([place]) 11/07/2021   Cardiac volume overload 02/72/5366   Acute systolic heart failure (HCC)    Scrotal pain; chronic, bilateral 07/05/2021   Microscopic hematuria 07/05/2021   Organic impotence 07/05/2021   Necrotizing fasciitis ([place]) 05/22/2019   Opiate overdose ([place])    Uncontrolled type 2 diabetes mellitus with hyperglycemia ([place]) 05/16/2019   Elevated lactic acid level    Acute renal failure superimposed on stage 3b chronic kidney disease ([place]) 05/15/2019   Cocaine abuse ([place]) 05/15/2019   Severe sepsis ([place]) 05/14/2019   Unstable angina ([place]) 10/16/2018   Apical mural thrombus 10/15/2018   Claudication in peripheral vascular disease ([place]) 11/30/2017   Peripheral arterial disease ([place]) 11/28/2017   Chest pain 11/03/2016   Constipation 11/03/2016   CKD (chronic kidney disease), stage II 08/02/2016   Cellulitis of right hand 08/02/2016   Chronic pain 08/02/2016   AKI (acute kidney injury) ([place]) 01/31/2016   Lobar pneumonia ([place]) 01/31/2016   Influenza A 01/31/2016   Diarrhea 01/30/2016   NSVT (nonsustained ventricular tachycardia) ([place]) 09/12/2015   Precordial chest pain 09/10/2015   Cocaine use 09/04/2015   Near syncope 06/29/2015   Essential hypertension 06/29/2015   Status post coronary artery stent placement 06/29/2015   Insulin dependent diabetes mellitus 06/29/2015   History of MI (myocardial infarction) 06/29/2015   Acute on chronic systolic CHF (congestive heart failure) ([place]) 06/29/2015   Musculoskeletal chest pain 05/27/2015   OSA on CPAP 01/21/2015   Rectal bleeding    Stage 3 chronic renal impairment associated with type 2 diabetes mellitus ([place]) 09/09/2013   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  09/09/2013   Coronary artery disease 09/09/2013   Cardiomyopathy, ischemic-EF 40-45% by echo 09/07/13 09/09/2013   S/P LAD DES June 2014 09/06/2013   Tobacco use disorder 09/06/2013   Hyperglycemia 08/22/2012   NSTEMI (non-ST elevated myocardial infarction) ([place]) 07/07/2012   Dyslipidemia 07/07/2012   Hypertensive heart disease    PCP:  Carrolyn Meiers, MD Pharmacy:   [place], [place] [address] [place] [address] Phone: [phone] Fax: [place], [place] [address] [place] [address] Phone: [phone] Fax: [phone]     Social Determinants of Health (SDOH) Interventions    Readmission Risk Interventions    05/21/2019   11:42 AM  Readmission Risk Prevention Plan  Transportation Screening Complete  PCP or Specialist Appt within 3-5 Days Not Complete  Not Complete comments rec for SNF  HRI or [place] Complete  Social Work Consult for [place] Planning/Counseling Complete  Palliative Care Screening Not Applicable  Medication Review (RN Care Manager) Referral to Pharmacy  PCP or Specialist appointment within 3-5 days of discharge Not Complete  PCP/Specialist Appt Not Complete comments plan for d/c to SNF  HRI or [place] Complete  SW Recovery Care/Counseling Consult Complete  Palliative Care Screening Not Applicable  [place] Not Applicable

## 2021-11-11 NOTE — Progress Notes (Signed)
Mobility Specialist Progress Note:   11/11/21 1050  Mobility  Activity Ambulated with assistance in room  Level of Assistance Minimal assist, patient does 75% or more  Assistive Device Front wheel walker  Distance Ambulated (ft) 20 ft  Activity Response Tolerated fair  Mobility Referral Yes  $Mobility charge 1 Mobility   Pt only agreeable to ambulate short distance in room, despite max encouragement. R foot swelling noted. Pt back in bed with all needs met, bed alarm on.   Nelta Numbers Acute Rehab Secure Chat or Office Phone: [phone]

## 2021-11-11 NOTE — Plan of Care (Signed)
  Problem: Education: Goal: Ability to demonstrate management of disease process will improve Outcome: Progressing Goal: Ability to verbalize understanding of medication therapies will improve Outcome: Progressing Goal: Individualized Educational Video(s) Outcome: Progressing   Problem: Activity: Goal: Capacity to carry out activities will improve Outcome: Progressing   Problem: Cardiac: Goal: Ability to achieve and maintain adequate cardiopulmonary perfusion will improve Outcome: Progressing   Problem: Education: Goal: Knowledge of disease or condition will improve Outcome: Progressing Goal: Knowledge of secondary prevention will improve (MUST DOCUMENT ALL) Outcome: Progressing Goal: Knowledge of patient specific risk factors will improve Elta [place] N/A or DELETE if not current risk factor) Outcome: Progressing   Problem: Ischemic Stroke/TIA Tissue Perfusion: Goal: Complications of ischemic stroke/TIA will be minimized Outcome: Progressing   Problem: Coping: Goal: Will verbalize positive feelings about self Outcome: Progressing Goal: Will identify appropriate support needs Outcome: Progressing   Problem: Health Behavior/Discharge Planning: Goal: Ability to manage health-related needs will improve Outcome: Progressing Goal: Goals will be collaboratively established with patient/family Outcome: Progressing   Problem: Self-Care: Goal: Ability to participate in self-care as condition permits will improve Outcome: Progressing Goal: Verbalization of feelings and concerns over difficulty with self-care will improve Outcome: Progressing Goal: Ability to communicate needs accurately will improve Outcome: Progressing   Problem: Nutrition: Goal: Risk of aspiration will decrease Outcome: Progressing Goal: Dietary intake will improve Outcome: Progressing   Problem: Education: Goal: Knowledge of General Education information will improve Description: Including pain rating scale,  medication(s)/side effects and non-pharmacologic comfort measures Outcome: Progressing   Problem: Health Behavior/Discharge Planning: Goal: Ability to manage health-related needs will improve Outcome: Progressing   Problem: Clinical Measurements: Goal: Ability to maintain clinical measurements within normal limits will improve Outcome: Progressing Goal: Will remain free from infection Outcome: Progressing Goal: Diagnostic test results will improve Outcome: Progressing Goal: Respiratory complications will improve Outcome: Progressing Goal: Cardiovascular complication will be avoided Outcome: Progressing   Problem: Activity: Goal: Risk for activity intolerance will decrease Outcome: Progressing   Problem: Nutrition: Goal: Adequate nutrition will be maintained Outcome: Progressing   Problem: Coping: Goal: Level of anxiety will decrease Outcome: Progressing   Problem: Elimination: Goal: Will not experience complications related to bowel motility Outcome: Progressing Goal: Will not experience complications related to urinary retention Outcome: Progressing   Problem: Pain Managment: Goal: General experience of comfort will improve Outcome: Progressing   Problem: Safety: Goal: Ability to remain free from injury will improve Outcome: Progressing   Problem: Skin Integrity: Goal: Risk for impaired skin integrity will decrease Outcome: Progressing

## 2021-11-11 NOTE — Progress Notes (Signed)
NAME:  [name], MRN:  [id number], DOB:  [birth year], LOS: 4 ADMISSION DATE:  11/07/2021, CONSULTATION DATE:  11/08/21 REFERRING MD:  [name]), CHIEF COMPLAINT:  pleural effusion   History of Present Illness:  66yo male smoker with hx polysubstance abuse, CAD, CKD, HTN, OSA initially admitted 10/29 with back pain. W/u included MRI which revealed probable R temporal lobe mass as well as pleural effusions and mediastinal adenopathy. Nsgy concerned that temporal lobe mass is dural- based metastasis, so PCCM consulted to eval mediastinal adenopathy as w/u for primary malignancy.   Of note pt has had odd behavior, attempting to leave AMA, uncooperative with staff, refusing medications and procedures.   Pertinent  Medical History   has a past medical history of Bulging lumbar disc, CAD (coronary artery disease) (1\7\5102), Chronic lower back pain, CKD (chronic kidney disease), stage II, Cocaine abuse ([place]), DM2 (diabetes mellitus, type 2) ([place]), Essential hypertension, GERD (gastroesophageal reflux disease), Headache, History of pneumonia, Hyperlipidemia, Ischemic cardiomyopathy, LV (left ventricular) mural thrombus, and Sleep apnea.   Significant Hospital Events: Including procedures, antibiotic start and stop dates in addition to other pertinent events   CT chest/abd/pelvis 10/29>>> 1. No acute intrathoracic or intra-abdominal injury. 2. Pathologic mediastinal adenopathy, new since prior examination, suspicious for a lymphoproliferative process. PET CT examination or short-term follow-up CT examination in 3 months once the patient's acute issues have resolved may be helpful for further evaluation. 3. Mild to moderate pulmonary edema, possibly cardiogenic in nature. Large right and small left pleural effusions. Mild ascites and diffuse subcutaneous edema within the abdominal wall in keeping with mild anasarca. 4. Mild cardiomegaly. Multi-vessel coronary artery stenting. 5. Peripheral  vascular disease with occlusion of the left common, internal, and proximal external iliac arteries. Reconstitution of the left external iliac artery via the inferior epigastric artery. 6. 3.3 cm fusiform right common iliac artery aneurysm. MR brain 10/30>>>1. Motion degraded exam. 2. 2.1 x 2.0 x 1.8 cm enhancing mass positioned at the medial right temporal horn. This is favored to be intra-axial in location, with primary differential considerations including a solitary intracranial metastasis versus a primary CNS neoplasm. Associated vasogenic edema and/or infiltrating nonenhancing tumor within the adjacent right temporal lobe. 3. No other acute intracranial abnormality. 4. Underlying atrophy with chronic microvascular ischemic disease, with a small chronic right PCA distribution infarct. Interim History / Subjective:  Postthoracentesis 11/10/2021  Objective   Blood pressure 117/72, pulse 72, temperature 98.3 F (36.8 C), temperature source Oral, resp. rate 18, height '5\' 9"'$  (1.753 m), weight 92.1 kg, SpO2 98 %.        Intake/Output Summary (Last 24 hours) at 11/11/2021 0939 Last data filed at 11/10/2021 1943 Gross per 24 hour  Intake 892.42 ml  Output --  Net 892.42 ml   Filed Weights   11/09/21 0442 11/10/21 0500 11/11/21 0500  Weight: 102 kg 91.1 kg 92.1 kg    Examination: General: 66 year old male no acute distress HEENT: MM pink/moist no JVD Neuro: Grossly intact CV: Heart sounds are regular PULM: Decreased breath sounds in the bases GI: soft, bsx4 active  GU: Voids Extremities: warm/dry, negative edema  Skin: no rashes or lesions    Assessment & Plan:  Mediastinal lymphadenopathy  Pleural effusion  ?Temporal metastasis  Acute on chronic systolic/diastolic heart failure with LVEF 30 % and Grade 3 diastolic dysfunction   Post thoracentesis from 11/10/2021 with 850 cc obtained. Cytology pending Questionable need for EBUS Continue to follow      Best  Practice (  right click and "Reselect all SmartList Selections" daily)  Per primary team  Labs   CBC: Recent Labs  Lab 11/07/21 0141 11/10/21 0347  WBC 11.0* 9.0  HGB 11.8* 12.8*  HCT 38.6* 41.9  MCV 81.4 79.7*  PLT 265 841    Basic Metabolic Panel: Recent Labs  Lab 11/07/21 0141 11/10/21 0347 11/11/21 0332  NA 139 138 138  K 3.7 3.6 3.7  CL 107 101 100  CO2 '25 29 29  '$ GLUCOSE 208* 111* 168*  BUN 18 21 26*  CREATININE 1.57* 1.53* 1.82*  CALCIUM 8.1* 8.1* 8.1*  MG  --  1.8  --    GFR: Estimated Creatinine Clearance: 44.2 mL/min (A) (by C-G formula based on SCr of 1.82 mg/dL (H)). Recent Labs  Lab 11/07/21 0141 11/10/21 0347  WBC 11.0* 9.0    Liver Function Tests: Recent Labs  Lab 11/07/21 0141  [name] 25  ALT 24  ALKPHOS 30*  BILITOT 0.6  PROT 7.5  ALBUMIN 2.7*   Recent Labs  Lab 11/07/21 0141  LIPASE 24   Recent Labs  Lab 11/08/21 0556  AMMONIA 19    ABG    Component Value Date/Time   HCO3 34.5 (H) 11/07/2021 0833   TCO2 26 11/03/2016 1727   O2SAT 16.8 11/07/2021 0833     Coagulation Profile: No results for input(s): "INR", "PROTIME" in the last 168 hours.  Cardiac Enzymes: No results for input(s): "CKTOTAL", "CKMB", "CKMBINDEX", "TROPONINI" in the last 168 hours.  HbA1C: Hgb A1c MFr Bld  Date/Time Value Ref Range Status  11/10/2021 03:47 AM 7.3 (H) 4.8 - 5.6 % Final    Comment:    (NOTE) Pre diabetes:          5.7%-6.4%  Diabetes:              >6.4%  Glycemic control for   <7.0% adults with diabetes   12/11/2020 12:18 PM 9.9 (H) 4.8 - 5.6 % Final    Comment:    (NOTE) Pre diabetes:          5.7%-6.4%  Diabetes:              >6.4%  Glycemic control for   <7.0% adults with diabetes     CBG: Recent Labs  Lab 11/10/21 1550 11/10/21 2034 11/11/21 0113 11/11/21 0405 11/11/21 0841  GLUCAP 198* 197* 169* 201* 191*   [name] ACNP Acute Care Nurse Practitioner [place] Please consult  Amion 11/11/2021, 9:39 AM

## 2021-11-12 ENCOUNTER — Encounter (HOSPITAL_COMMUNITY)
Admission: EM | Disposition: A | Discharge status: Home Health | Payer: Self-pay | Source: Home / Self Care | Attending: Internal Medicine

## 2021-11-12 ENCOUNTER — Inpatient Hospital Stay (HOSPITAL_COMMUNITY): Payer: No Typology Code available for payment source

## 2021-11-12 ENCOUNTER — Inpatient Hospital Stay (HOSPITAL_COMMUNITY): Payer: No Typology Code available for payment source | Admitting: Certified Registered Nurse Anesthetist

## 2021-11-12 DIAGNOSIS — I509 Heart failure, unspecified: Secondary | ICD-10-CM

## 2021-11-12 DIAGNOSIS — G9341 Metabolic encephalopathy: Secondary | ICD-10-CM | POA: Diagnosis not present

## 2021-11-12 DIAGNOSIS — R590 Localized enlarged lymph nodes: Secondary | ICD-10-CM | POA: Diagnosis not present

## 2021-11-12 DIAGNOSIS — I252 Old myocardial infarction: Secondary | ICD-10-CM

## 2021-11-12 DIAGNOSIS — I25119 Atherosclerotic heart disease of native coronary artery with unspecified angina pectoris: Secondary | ICD-10-CM | POA: Diagnosis not present

## 2021-11-12 DIAGNOSIS — I2510 Atherosclerotic heart disease of native coronary artery without angina pectoris: Secondary | ICD-10-CM | POA: Diagnosis not present

## 2021-11-12 DIAGNOSIS — E440 Moderate protein-calorie malnutrition: Secondary | ICD-10-CM | POA: Diagnosis not present

## 2021-11-12 DIAGNOSIS — I5023 Acute on chronic systolic (congestive) heart failure: Secondary | ICD-10-CM | POA: Diagnosis not present

## 2021-11-12 DIAGNOSIS — F17200 Nicotine dependence, unspecified, uncomplicated: Secondary | ICD-10-CM

## 2021-11-12 DIAGNOSIS — I110 Hypertensive heart disease with heart failure: Secondary | ICD-10-CM

## 2021-11-12 HISTORY — PX: VIDEO BRONCHOSCOPY WITH ENDOBRONCHIAL ULTRASOUND: SHX6177

## 2021-11-12 HISTORY — PX: BRONCHIAL NEEDLE ASPIRATION BIOPSY: SHX5106

## 2021-11-12 LAB — BASIC METABOLIC PANEL
Anion gap: 11 (ref 5–15)
BUN: 27 mg/dL — ABNORMAL HIGH (ref 8–23)
CO2: 30 mmol/L (ref 22–32)
Calcium: 8.5 mg/dL — ABNORMAL LOW (ref 8.9–10.3)
Chloride: 97 mmol/L — ABNORMAL LOW (ref 98–111)
Creatinine, Ser: 1.78 mg/dL — ABNORMAL HIGH (ref 0.61–1.24)
GFR, Estimated: 41 mL/min — ABNORMAL LOW (ref >60)
Glucose, Bld: 147 mg/dL — ABNORMAL HIGH (ref 70–99)
Potassium: 3.9 mmol/L (ref 3.5–5.1)
Sodium: 138 mmol/L (ref 135–145)

## 2021-11-12 LAB — MAGNESIUM: Magnesium: 1.9 mg/dL (ref 1.7–2.4)

## 2021-11-12 LAB — GLUCOSE, CAPILLARY
Glucose-Capillary: 187 mg/dL — ABNORMAL HIGH (ref 70–99)
Glucose-Capillary: 178 mg/dL — ABNORMAL HIGH (ref 70–99)
Glucose-Capillary: 258 mg/dL — ABNORMAL HIGH (ref 70–99)
Glucose-Capillary: 316 mg/dL — ABNORMAL HIGH (ref 70–99)
Glucose-Capillary: 256 mg/dL — ABNORMAL HIGH (ref 70–99)

## 2021-11-12 LAB — CHOLESTEROL, BODY FLUID: Cholesterol, Fluid: 27 mg/dL

## 2021-11-12 LAB — CULTURE, BLOOD (ROUTINE X 2)
Culture: NO GROWTH
Culture: NO GROWTH
Special Requests: ADEQUATE
Special Requests: ADEQUATE

## 2021-11-12 LAB — CYTOLOGY - NON PAP

## 2021-11-12 SURGERY — BRONCHOSCOPY, WITH EBUS
Anesthesia: General | Laterality: Right

## 2021-11-12 MED ORDER — ONDANSETRON HCL 4 MG/2ML IJ SOLN: 4.0000 mg | Freq: Four times a day (QID) | INTRAMUSCULAR | Status: DC | PRN

## 2021-11-12 MED ORDER — FENTANYL CITRATE (PF) 100 MCG/2ML IJ SOLN
INTRAMUSCULAR | Status: AC
  Filled 2021-11-12: qty 2

## 2021-11-12 MED ORDER — ALBUMIN HUMAN 5 % IV SOLN: INTRAVENOUS | Status: DC | PRN

## 2021-11-12 MED ORDER — DEXAMETHASONE SODIUM PHOSPHATE 10 MG/ML IJ SOLN
INTRAMUSCULAR | Status: DC | PRN
  Administered 2021-11-12: 8 mg via INTRAVENOUS

## 2021-11-12 MED ORDER — PROPOFOL 10 MG/ML IV BOLUS
INTRAVENOUS | Status: DC | PRN
  Administered 2021-11-12: 40 mg via INTRAVENOUS

## 2021-11-12 MED ORDER — EPHEDRINE SULFATE-NACL 50-0.9 MG/10ML-% IV SOSY
PREFILLED_SYRINGE | INTRAVENOUS | Status: DC | PRN
  Administered 2021-11-12: 5 mg via INTRAVENOUS
  Administered 2021-11-12: 10 mg via INTRAVENOUS

## 2021-11-12 MED ORDER — ROCURONIUM BROMIDE 100 MG/10ML IV SOLN
INTRAVENOUS | Status: DC | PRN
  Administered 2021-11-12: 40 mg via INTRAVENOUS
  Administered 2021-11-12: 10 mg via INTRAVENOUS

## 2021-11-12 MED ORDER — LIDOCAINE 2% (20 MG/ML) 5 ML SYRINGE
INTRAMUSCULAR | Status: DC | PRN
  Administered 2021-11-12: 60 mg via INTRAVENOUS

## 2021-11-12 MED ORDER — FENTANYL CITRATE (PF) 100 MCG/2ML IJ SOLN: 25.0000 ug | INTRAMUSCULAR | Status: DC | PRN

## 2021-11-12 MED ORDER — SUCCINYLCHOLINE CHLORIDE 200 MG/10ML IV SOSY
PREFILLED_SYRINGE | INTRAVENOUS | Status: DC | PRN
  Administered 2021-11-12: 120 mg via INTRAVENOUS

## 2021-11-12 MED ORDER — SUGAMMADEX SODIUM 200 MG/2ML IV SOLN
INTRAVENOUS | Status: DC | PRN
  Administered 2021-11-12: 200 mg via INTRAVENOUS
  Administered 2021-11-12: 100 mg via INTRAVENOUS

## 2021-11-12 MED ORDER — PHENYLEPHRINE HCL-NACL 20-0.9 MG/250ML-% IV SOLN
INTRAVENOUS | Status: DC | PRN
  Administered 2021-11-12: 60 ug/min via INTRAVENOUS

## 2021-11-12 MED ORDER — ETOMIDATE 2 MG/ML IV SOLN
INTRAVENOUS | Status: DC | PRN
  Administered 2021-11-12: 20 mg via INTRAVENOUS

## 2021-11-12 MED ORDER — FENTANYL CITRATE (PF) 250 MCG/5ML IJ SOLN
INTRAMUSCULAR | Status: DC | PRN
  Administered 2021-11-12: 100 ug via INTRAVENOUS

## 2021-11-12 MED ORDER — OXYCODONE HCL 5 MG/5ML PO SOLN: 5.0000 mg | Freq: Once | ORAL | Status: DC | PRN

## 2021-11-12 MED ORDER — VASOPRESSIN 20 UNIT/ML IV SOLN
INTRAVENOUS | Status: DC | PRN
  Administered 2021-11-12: 2 [IU] via INTRAVENOUS

## 2021-11-12 MED ORDER — LACTATED RINGERS IV SOLN: INTRAVENOUS | Status: DC | PRN

## 2021-11-12 MED ORDER — PHENYLEPHRINE 80 MCG/ML (10ML) SYRINGE FOR IV PUSH (FOR BLOOD PRESSURE SUPPORT)
PREFILLED_SYRINGE | INTRAVENOUS | Status: DC | PRN
  Administered 2021-11-12 (×2): 160 ug via INTRAVENOUS

## 2021-11-12 MED ORDER — OXYCODONE HCL 5 MG PO TABS: 5.0000 mg | ORAL_TABLET | Freq: Once | ORAL | Status: DC | PRN

## 2021-11-12 MED ORDER — ONDANSETRON HCL 4 MG/2ML IJ SOLN
INTRAMUSCULAR | Status: DC | PRN
  Administered 2021-11-12: 4 mg via INTRAVENOUS

## 2021-11-12 NOTE — Transfer of Care (Signed)
Immediate Anesthesia Transfer of Care Note  Patient: [name]  Procedure(s) Performed: VIDEO BRONCHOSCOPY WITH ENDOBRONCHIAL ULTRASOUND (Right) BRONCHIAL NEEDLE ASPIRATION BIOPSIES  Patient Location: PACU  Anesthesia Type:General  Level of Consciousness: drowsy and patient cooperative  Airway & Oxygen Therapy: Patient Spontanous Breathing and Patient connected to face mask oxygen  Post-op Assessment: Report given to RN, Post -op Vital signs reviewed and stable, and Patient moving all extremities X 4  Post vital signs: Reviewed and stable  Last Vitals:  Vitals Value Taken Time  BP 111/74 11/12/21 0930  Temp 36.4 C 11/12/21 0930  Pulse 67 11/12/21 0937  Resp 20 11/12/21 0937  SpO2 100 % 11/12/21 0937  Vitals shown include unvalidated device data.  Last Pain:  Vitals:   11/12/21 0727  TempSrc: Temporal  PainSc: 7          Complications: No notable events documented.

## 2021-11-12 NOTE — Op Note (Signed)
Flexible and EBUS Bronchoscopy Procedure Note  [name]  [id number]  1955-05-04  Date:11/12/21  Time:9:21 AM   Provider Performing:Jinny Sweetland M Verlee Monte   Procedure: Flexible bronchoscopy and EBUS Bronchoscopy  Indication(s) Mediastinal lymphadenopathy and brain mass  Consent Risks of the procedure as well as the alternatives and risks of each were explained to the patient and/or caregiver.  Consent for the procedure was obtained.  Anesthesia General Anesthesia   Time Out Verified patient identification, verified procedure, site/side was marked, verified correct patient position, special equipment/implants available, medications/allergies/relevant history reviewed, required imaging and test results available.   Sterile Technique Usual hand hygiene, masks, gowns, and gloves were used   Procedure Description Diagnostic bronchoscope advanced through endotracheal tube and into airway.  Airways were examined down to subsegmental level with findings noted below. The diagnostic bronchoscope was then removed and the EBUS bronchoscope was advanced into airway with stations 7S, 4R, 4L biopsied and sent for slide, cell block, and/or culture.  The EBUS bronchoscope was removed after assuring no active bleeding from biopsy site.  Findings:  - edematous mucosa throughout which was surprisingly friable - EBNA with 8 passes at 7S - EBNA with 4 passes at 4R - EBNA with 4 passes at 4L   Complications/Tolerance None; patient tolerated the procedure well. Chest X-ray is not needed post procedure.   EBL Minimal   Specimen(s) EBNA sent for cytology

## 2021-11-12 NOTE — Anesthesia Postprocedure Evaluation (Signed)
Anesthesia Post Note  Patient: SERGEI DELO  Procedure(s) Performed: VIDEO BRONCHOSCOPY WITH ENDOBRONCHIAL ULTRASOUND (Right) BRONCHIAL NEEDLE ASPIRATION BIOPSIES     Patient location during evaluation: PACU Anesthesia Type: General Level of consciousness: awake and alert Pain management: pain level controlled Vital Signs Assessment: post-procedure vital signs reviewed and stable Respiratory status: spontaneous breathing, nonlabored ventilation, respiratory function stable and patient connected to nasal cannula oxygen Cardiovascular status: blood pressure returned to baseline and stable Postop Assessment: no apparent nausea or vomiting Anesthetic complications: no   No notable events documented.  Last Vitals:  Vitals:   11/12/21 1010 11/12/21 1015  BP: 97/74 107/73  Pulse: 73 69  Resp: 20 16  Temp:  36.6 C  SpO2: 97% 96%    Last Pain:  Vitals:   11/12/21 1015  TempSrc:   PainSc: 0-No pain                 Sui Kasparek S

## 2021-11-12 NOTE — Anesthesia Preprocedure Evaluation (Signed)
Anesthesia Evaluation  Patient identified by MRN, date of birth, ID band Patient awake    Reviewed: Allergy & Precautions, H&P , NPO status , Patient's Chart, lab work & pertinent test results  Airway Mallampati: II   Neck ROM: full    Dental   Pulmonary sleep apnea , Current Smoker   breath sounds clear to auscultation       Cardiovascular hypertension, + angina  + CAD, + Past MI, + Cardiac Stents, + Peripheral Vascular Disease and +CHF   Rhythm:regular Rate:Normal  EF 25-30%   Neuro/Psych  Headaches CVA    GI/Hepatic ,GERD  ,,  Endo/Other  diabetes, Type 2    Renal/GU Renal InsufficiencyRenal disease     Musculoskeletal   Abdominal   Peds  Hematology   Anesthesia Other Findings   Reproductive/Obstetrics                             Anesthesia Physical Anesthesia Plan  ASA: 3  Anesthesia Plan: General   Post-op Pain Management:    Induction: Intravenous  PONV Risk Score and Plan: 1 and Ondansetron, Dexamethasone, Midazolam and Treatment may vary due to age or medical condition  Airway Management Planned: Oral ETT  Additional Equipment:   Intra-op Plan:   Post-operative Plan: Extubation in OR  Informed Consent: I have reviewed the patients History and Physical, chart, labs and discussed the procedure including the risks, benefits and alternatives for the proposed anesthesia with the patient or authorized representative who has indicated his/her understanding and acceptance.     Dental advisory given  Plan Discussed with: CRNA, Anesthesiologist and Surgeon  Anesthesia Plan Comments:        Anesthesia Quick Evaluation

## 2021-11-12 NOTE — Progress Notes (Signed)
PT Cancellation Note  Patient Details Name: [name] MRN: [id number] DOB: [birth year]   Cancelled Treatment:    Reason Eval/Treat Not Completed: Patient at procedure or test/unavailable   Shary Decamp [facility] 11/12/2021, 7:49 AM [place] Office [phone]

## 2021-11-12 NOTE — Assessment & Plan Note (Signed)
Sp biopsy, follow up pathology.

## 2021-11-12 NOTE — Interval H&P Note (Signed)
History and Physical Interval Note:  11/12/2021 7:47 AM  [name]  has presented today for surgery, with the diagnosis of mediastinal adenopathy.  The various methods of treatment have been discussed with the patient and family. After consideration of risks, benefits and other options for treatment, the patient has consented to  Procedure(s): [place] (Right) as a surgical intervention.  The patient's history has been reviewed, patient examined, no change in status, stable for surgery.  I have reviewed the patient's chart and labs.  Questions were answered to the patient's satisfaction.     Maryjane Hurter

## 2021-11-12 NOTE — Progress Notes (Signed)
Progress Note   Patient: [name] NOI:[id number] DOB: 02-09-1955 DOA: 11/07/2021     5 DOS: the patient was seen and examined on 11/12/2021   Brief hospital course: Mr. Westrup was admitted to the hospital with the working diagnosis of acute metabolic encephalopathy.   66 yo male with the past medical history of coronary artery disease, CKD, cocaine use, T2DM and sleep apnea who presented with back pain. Few days of back and testicular pain. At home patient had a fall with head trauma. On his initial physical examination his blood pressure was 160/110. HR 84, RR 20 and 02 saturation 95%, lungs with wheezing, heart with S1 and S2 present and rhythmic, abdomen with no distention or tenderness, no lower extremity edema. He had slurred speech and moving all 4 extremities.   Na 139, K 3,7 CL 107 bicarbonate 25 glucose 208 bun 18 cr 1,57 BNP 1,157  High sensitive troponin 31, 38, 31, 32  Wbc 11,0 hgb 11,8 plt 265  Urine analysis SG 1,027, >300 protein, negative leukocytes.  Toxicology negative   Chest radiograph with cardiomegaly, bilateral hilar vascular congestion, right pleural effusion CT chest/ abdomen and pelvis with bilateral ground glass opacities with right pleural effusion. Pathological mediastinal adenopathy, new since prior examination, suspicious for lymphoproliferative process.  Mild ascites and diffuse subcutaneous edema within the abdominal wall in keeping with mild anasarca.   EKG 86 bpm, left axis deviation, left anterior fascicular block, qtc 555, sinus rhythm with PAC, positive q wave V1 to V5, no significant ST segment or T wave changes, low voltage.   Head CT with hypodensity in the right temporal lobe, which is new from the prior examination.  Patient [facility].  MRI of the brain with contrast suggested extra-axial mass concerning for meningioma versus dural metastases.    11/01 thoracentesis 850 cc obtained.  11/03 Flexible bronchoscopy  and EBUS Bronchoscopy   Assessment and Plan: Acute metabolic encephalopathy Multifactorial encephalopathy clinically has improved Patient with no clinical signs of viral encephalitis  Antiviral therapy has been discontinued Continue to encourage mobility.  Continue with thiamine.  Patient will need home PT    Acute on chronic systolic CHF (congestive heart failure) (HCC) Echocardiogram with reduced LV systolic function with EF 25 to 30%, asymmetric left ventricular hypertrophy of the basal segment. Interventricular septum flattened in systole consistent with right ventricular pressure overload. RVSP 44.8  Left ventricle entire apex and mid inferoseptal segment are akinetic, the mid anteroseptal segment, mid inferolateral segment, midl anterolateral segment, mid anterior segment and mid inferior segment are hypokinetic. Trivial pericardial effusion.   Troponin elevation flat not consistent with acute coronary syndrome EKG with old ischemic changes.   Last visit with cardiology 09/2021 documented left apical thrombus. Patient not interested in taking cardiac medications.   Urine output 1,300 with improvement in edema Systolic blood pressure 694 to 120 mmHg.   Will transition to oral furosemide and continue carvedilol Holding on RAS inhibition for now due to reduced GFR  Coronary artery disease Patient had LAD, RCA drug eluding stents placed, (2017 and 2021)  He has refused to take cardiac medications.  Currently with no chest pain,   Acute respiratory failure with hypoxia ([place]) - Most likely secondary to CHF - See plan for CHF exacerbation  Acute renal failure superimposed on stage 3b chronic kidney disease ([place]) His renal function has improved with diuresis, today serum cr is  1.78 with K at 3,9 and serum bicarbonate at 30,  Na 138 and Mg 1,9  Plan to continue diuresis with oral furosemide.   Protein calorie malnutrition (HCC) - Albumin 2.7 - Encourage nutrient dense food  choices  Stroke (cerebrum) ([place]) Patient ruled out for acute CVA Continue blood pressure monitoring    Tobacco use disorder - Patient is a current smoker - Nicotine patch ordered - Counseled patient on the importance of cessation but he has no interest in quitting at this time  Uncontrolled type 2 diabetes mellitus with hyperglycemia ([place]) Continue glucose cover and monitoring with insulin sliding scale.  Patient is tolerating po well.   Mediastinal lymphadenopathy Sp biopsy, follow up pathology.   Elevated troponin - Troponin 31 - In the setting of CHF - Likely demand ischemia - Trend troponin - EKG shows a heart rate of 86, sinus rhythm, QTc 555 without any acute ischemic changes - Continue to monitor  Claudication in peripheral vascular disease [facility]) - Patient had vascular surgery last year per his report - CT scan shows occlusion of the left common internal and proximal iliac artery.  There is reconstitution of the external on the CTA - Consult vascular surgery - When med rec is complete, add back home medications - Continue to monitor        Subjective: Patient with improvement in edema and dyspnea, he had bronchoscopy today with lymph node biopsy.  Physical Exam: Vitals:   11/12/21 1005 11/12/21 1010 11/12/21 1015 11/12/21 1700  BP: 109/83 97/74 107/73 120/72  Pulse: 61 73 69   Resp: _0 Temp:   97.9 F (36.6 C) 98.9 F (37.2 C)  TempSrc:    Oral  SpO2: 100% 97% 96% 96%  Weight:      Height:       Neurology awake and alert ENT with no pallor Cardiovascular with S1 and S2 present and rhythmic with no gallops, rubs or murmurs Respiratory with mild rales at bases Abdomen with no distention Trace lower extremity edema  Data Reviewed:    Family Communication: no family at the bedside   Disposition: Status is: Inpatient Remains inpatient appropriate because: encephalopathy, heart failure and sp lymph node biopsy   Planned Discharge  Destination: Home    Author: Tawni Millers, MD 11/12/2021 5:25 PM  For on call review [email or web address].

## 2021-11-12 NOTE — Anesthesia Procedure Notes (Signed)
Procedure Name: Intubation Date/Time: 11/12/2021 7:57 AM  Performed by: Darletta Moll, CRNAPre-anesthesia Checklist: Patient identified, Emergency Drugs available, Suction available and Patient being monitored Patient Re-evaluated:Patient Re-evaluated prior to induction Oxygen Delivery Method: Circle system utilized Preoxygenation: Pre-oxygenation with 100% oxygen Induction Type: IV induction Ventilation: Mask ventilation with difficulty Laryngoscope Size: Mac and 4 Tube type: Oral Tube size: 8.5 mm Number of attempts: 1 Airway Equipment and Method: Stylet and Oral airway Placement Confirmation: ETT inserted through vocal cords under direct vision, positive ETCO2 and breath sounds checked- equal and bilateral Secured at: 22 cm Tube secured with: Tape Dental Injury: Teeth and Oropharynx as per pre-operative assessment

## 2021-11-13 DIAGNOSIS — G9341 Metabolic encephalopathy: Secondary | ICD-10-CM | POA: Diagnosis not present

## 2021-11-13 DIAGNOSIS — E440 Moderate protein-calorie malnutrition: Secondary | ICD-10-CM | POA: Diagnosis not present

## 2021-11-13 DIAGNOSIS — I2510 Atherosclerotic heart disease of native coronary artery without angina pectoris: Secondary | ICD-10-CM | POA: Diagnosis not present

## 2021-11-13 DIAGNOSIS — I5023 Acute on chronic systolic (congestive) heart failure: Secondary | ICD-10-CM | POA: Diagnosis not present

## 2021-11-13 LAB — CBC
HCT: 40.9 % (ref 39.0–52.0)
Hemoglobin: 12.3 g/dL — ABNORMAL LOW (ref 13.0–17.0)
MCH: 24.5 pg — ABNORMAL LOW (ref 26.0–34.0)
MCHC: 30.1 g/dL (ref 30.0–36.0)
MCV: 81.5 fL (ref 80.0–100.0)
Platelets: 280 10*3/uL (ref 150–400)
RBC: 5.02 MIL/uL (ref 4.22–5.81)
RDW: 20.8 % — ABNORMAL HIGH (ref 11.5–15.5)
WBC: 10 10*3/uL (ref 4.0–10.5)
nRBC: 0 % (ref 0.0–0.2)

## 2021-11-13 LAB — BASIC METABOLIC PANEL
Anion gap: 11 (ref 5–15)
BUN: 38 mg/dL — ABNORMAL HIGH (ref 8–23)
CO2: 29 mmol/L (ref 22–32)
Calcium: 9 mg/dL (ref 8.9–10.3)
Chloride: 97 mmol/L — ABNORMAL LOW (ref 98–111)
Creatinine, Ser: 1.91 mg/dL — ABNORMAL HIGH (ref 0.61–1.24)
GFR, Estimated: 38 mL/min — ABNORMAL LOW (ref >60)
Glucose, Bld: 243 mg/dL — ABNORMAL HIGH (ref 70–99)
Potassium: 4.8 mmol/L (ref 3.5–5.1)
Sodium: 137 mmol/L (ref 135–145)

## 2021-11-13 LAB — GLUCOSE, CAPILLARY
Glucose-Capillary: 222 mg/dL — ABNORMAL HIGH (ref 70–99)
Glucose-Capillary: 263 mg/dL — ABNORMAL HIGH (ref 70–99)

## 2021-11-13 MED ORDER — ATORVASTATIN CALCIUM 40 MG PO TABS: 40.0000 mg | ORAL_TABLET | Freq: Every day | ORAL | 0 refills | Status: DC

## 2021-11-13 MED ORDER — FUROSEMIDE 40 MG PO TABS
40.0000 mg | ORAL_TABLET | Freq: Every day | ORAL | Status: DC
  Administered 2021-11-13: 40 mg via ORAL
  Filled 2021-11-13: qty 1

## 2021-11-13 MED ORDER — FUROSEMIDE 40 MG PO TABS: 40.0000 mg | ORAL_TABLET | Freq: Every day | ORAL | 0 refills | Status: DC

## 2021-11-13 MED ORDER — CARVEDILOL 3.125 MG PO TABS: 3.1250 mg | ORAL_TABLET | Freq: Two times a day (BID) | ORAL | 0 refills | Status: DC

## 2021-11-13 MED ORDER — ASPIRIN 81 MG PO TBEC: 81.0000 mg | DELAYED_RELEASE_TABLET | Freq: Every day | ORAL | Status: DC

## 2021-11-13 MED ORDER — ASPIRIN 81 MG PO TBEC: 81.0000 mg | DELAYED_RELEASE_TABLET | Freq: Every day | ORAL | 0 refills | Status: AC

## 2021-11-13 MED ORDER — ENSURE ENLIVE PO LIQD: 237.0000 mL | Freq: Two times a day (BID) | ORAL | 0 refills | Status: AC

## 2021-11-13 MED ORDER — JANUMET 50-500 MG PO TABS: 1.0000 | ORAL_TABLET | Freq: Two times a day (BID) | ORAL | 0 refills | Status: DC

## 2021-11-13 NOTE — Plan of Care (Signed)
  Problem: Education: Goal: Ability to demonstrate management of disease process will improve Outcome: Progressing Goal: Ability to verbalize understanding of medication therapies will improve Outcome: Progressing Goal: Individualized Educational Video(s) Outcome: Progressing   Problem: Activity: Goal: Capacity to carry out activities will improve Outcome: Progressing   Problem: Cardiac: Goal: Ability to achieve and maintain adequate cardiopulmonary perfusion will improve Outcome: Progressing   Problem: Education: Goal: Knowledge of disease or condition will improve Outcome: Progressing Goal: Knowledge of secondary prevention will improve (MUST DOCUMENT ALL) Outcome: Progressing Goal: Knowledge of patient specific risk factors will improve Elta [place] N/A or DELETE if not current risk factor) Outcome: Progressing   Problem: Ischemic Stroke/TIA Tissue Perfusion: Goal: Complications of ischemic stroke/TIA will be minimized Outcome: Progressing   Problem: Coping: Goal: Will verbalize positive feelings about self Outcome: Progressing Goal: Will identify appropriate support needs Outcome: Progressing   Problem: Health Behavior/Discharge Planning: Goal: Ability to manage health-related needs will improve Outcome: Progressing Goal: Goals will be collaboratively established with patient/family Outcome: Progressing   Problem: Self-Care: Goal: Ability to participate in self-care as condition permits will improve Outcome: Progressing Goal: Verbalization of feelings and concerns over difficulty with self-care will improve Outcome: Progressing Goal: Ability to communicate needs accurately will improve Outcome: Progressing   Problem: Nutrition: Goal: Risk of aspiration will decrease Outcome: Progressing Goal: Dietary intake will improve Outcome: Progressing   Problem: Education: Goal: Knowledge of General Education information will improve Description: Including pain rating scale,  medication(s)/side effects and non-pharmacologic comfort measures Outcome: Progressing   Problem: Health Behavior/Discharge Planning: Goal: Ability to manage health-related needs will improve Outcome: Progressing   Problem: Clinical Measurements: Goal: Ability to maintain clinical measurements within normal limits will improve Outcome: Progressing Goal: Will remain free from infection Outcome: Progressing Goal: Diagnostic test results will improve Outcome: Progressing Goal: Respiratory complications will improve Outcome: Progressing Goal: Cardiovascular complication will be avoided Outcome: Progressing   Problem: Activity: Goal: Risk for activity intolerance will decrease Outcome: Progressing   Problem: Nutrition: Goal: Adequate nutrition will be maintained Outcome: Progressing   Problem: Coping: Goal: Level of anxiety will decrease Outcome: Progressing   Problem: Elimination: Goal: Will not experience complications related to bowel motility Outcome: Progressing Goal: Will not experience complications related to urinary retention Outcome: Progressing   Problem: Pain Managment: Goal: General experience of comfort will improve Outcome: Progressing   Problem: Safety: Goal: Ability to remain free from injury will improve Outcome: Progressing   Problem: Skin Integrity: Goal: Risk for impaired skin integrity will decrease Outcome: Progressing

## 2021-11-13 NOTE — Plan of Care (Signed)
  Problem: Education: Goal: Ability to demonstrate management of disease process will improve Outcome: Progressing   Problem: Activity: Goal: Capacity to carry out activities will improve Outcome: Progressing   Problem: Cardiac: Goal: Ability to achieve and maintain adequate cardiopulmonary perfusion will improve Outcome: Progressing   Problem: Education: Goal: Knowledge of disease or condition will improve Outcome: Progressing   Problem: Ischemic Stroke/TIA Tissue Perfusion: Goal: Complications of ischemic stroke/TIA will be minimized Outcome: Progressing

## 2021-11-13 NOTE — Discharge Summary (Addendum)
Physician Discharge Summary   Patient: [name] MRN: [id number] DOB: Feb 11, 1955  Admit date:     11/07/2021  Discharge date: 11/13/21  Discharge Physician: [name]   PCP: Russell Meiers, MD   Recommendations at discharge:    Patient is feeling better, no confusion or agitation  Pending results of mediastinal lymphadenopathy biopsy. If lymph node biopsy are negative for malignancy may need brain biopsy for possible dural bases metastasis.  Continue with oral furosemide for diuresis Carvedilol for heart failure management.  Limited pharmacologic therapy due to reduced GFR,  Follow up with pulmonary and cardiology as outpatient Follow up with [name] as outpatient in 7 to 10 days Follow up renal function in 7 days as outpatient.  Added aspirin for coronary artery disease, secondary prophylaxis.   I spoke with his daughter over the phone about patient's condition, diagnosis and plan of care at discharge, all questions were addressed.   Discharge Diagnoses: Active Problems:   Acute metabolic encephalopathy   Acute on chronic systolic CHF (congestive heart failure) (HCC)   Coronary artery disease   Acute renal failure superimposed on stage 3b chronic kidney disease (HCC)   Protein calorie malnutrition (HCC)   Stroke (cerebrum) (HCC)   Tobacco use disorder   Uncontrolled type 2 diabetes mellitus with hyperglycemia (HCC)   Mediastinal lymphadenopathy  Resolved Problems:   * No resolved hospital problems. [facility] Course: [name] was admitted to the hospital with the working diagnosis of acute metabolic encephalopathy.   66 yo male with the past medical history of coronary artery disease, CKD, cocaine use, T2DM and sleep apnea who presented with back pain. Few days of back and testicular pain. At home patient had a fall with head trauma. On his initial physical examination his blood pressure was 160/110. HR 84, RR 20 and 02 saturation 95%,  lungs with wheezing, heart with S1 and S2 present and rhythmic, abdomen with no distention or tenderness, no lower extremity edema. He had slurred speech and moving all 4 extremities.   Na 139, K 3,7 CL 107 bicarbonate 25 glucose 208 bun 18 cr 1,57 BNP 1,157  High sensitive troponin 31, 38, 31, 32  Wbc 11,0 hgb 11,8 plt 265  Urine analysis SG 1,027, >300 protein, negative leukocytes.  Toxicology negative   Chest radiograph with cardiomegaly, bilateral hilar vascular congestion, right pleural effusion CT chest/ abdomen and pelvis with bilateral ground glass opacities with right pleural effusion. Pathological mediastinal adenopathy, new since prior examination, suspicious for lymphoproliferative process.  Mild ascites and diffuse subcutaneous edema within the abdominal wall in keeping with mild anasarca.   EKG 86 bpm, left axis deviation, left anterior fascicular block, qtc 555, sinus rhythm with PAC, positive q wave V1 to V5, no significant ST segment or T wave changes, low voltage.   Head CT with hypodensity in the right temporal lobe, which is new from the prior examination.  Patient was transferred to [facility] for brain MRI.  MRI of the brain with contrast suggested extra-axial mass concerning for meningioma versus dural metastases.    11/01 thoracentesis 850 cc obtained.  11/03 Flexible bronchoscopy and EBUS Bronchoscopy   Assessment and Plan: Acute metabolic encephalopathy Multifactorial encephalopathy clinically [name] improved Patient with no clinical signs of viral encephalitis  Antiviral therapy [name] been discontinued Patient [name] been evaluated by physical therapy with recommendations for home health services.      Acute on chronic systolic CHF (congestive heart failure) (HCC) Echocardiogram with reduced LV  systolic function with EF 25 to 30%, asymmetric left ventricular hypertrophy of the basal segment. Interventricular septum flattened in systole consistent with right ventricular  pressure overload. RVSP 44.8  Left ventricle entire apex and mid inferoseptal segment are akinetic, the mid anteroseptal segment, mid inferolateral segment, midl anterolateral segment, mid anterior segment and mid inferior segment are hypokinetic. Trivial pericardial effusion.   Troponin elevation flat not consistent with acute coronary syndrome EKG with old ischemic changes.   Last visit with cardiology 09/2021 documented left apical thrombus. Patient not interested in taking cardiac medications.   Patient was placed on furosemide for diuresis, negative fluid balance was achieved, losing 5 kg since admission.   Patient will continue taking carvedilol and daily furosemide 40 mg. Currently not on RAS inhibition for now due to reduced GFR Follow up as outpatient.   Coronary artery disease Patient had LAD, RCA drug eluding stents placed, (2017 and 2021)  He [name] refused to take cardiac medications as outpatient.   Currently with no chest pain.  Will recommend patient to take aspirin and statin therapy and follow up with [name] as outpatient.   Acute respiratory failure with hypoxia (HCC) - Most likely secondary to CHF - See plan for CHF exacerbation  Acute renal failure superimposed on stage 3b chronic kidney disease ([place]) Patient's volume [name] improved, at the time of his discharge his renal function [name] a serum cr of 1,91 with K at 4,8 and serum bicarbonate at 29. His base serum cr [name] been 1,5 to 1,7.   Plan to continue diuresis with oral furosemide.  Follow up renal function as outpatient.    Protein calorie malnutrition (HCC) - Albumin 2.7 - Encourage nutrient dense food choices  Stroke (cerebrum) ([place]) Patient ruled out for acute CVA Continue blood pressure monitoring    Tobacco use disorder - Patient is a current smoker - Nicotine patch ordered - Counseled patient on the importance of cessation but he [name] no interest in quitting at this time  Uncontrolled type 2  diabetes mellitus with hyperglycemia ([place]) Hyperglycemia.  His fasting glucose at the time of discharge 243. At the time of his discharge patient will resume JANUMET and have close follow up as outpatient.  He [name] been not compliant with his medications at home.   Mediastinal lymphadenopathy Patient with right temporal lobe mass. Neurology and Neurosurgery were consulted.   Per neurosurgery more extra axial mass and consistent with meningioma or dural based metastasis. With recommendations for workup primary source.   Pleural effusion and mediastinal adenopathy of unclear significance.  Patient had thoracentesis on the right side, 850 ml drained, transudate per fluid analysis, with no malignant cells.  Patient underwent further work up with endobronchial ultrasound and mediastinal nodal biopsy, and will need outpatient PET scan.  If pleural fluid and node biopsy are benign he is going to need biopsy of his brain mass.   Follow up with pulmonary as outpatient.   Elevated troponin - Troponin 31 - In the setting of CHF - Likely demand ischemia - Trend troponin - EKG shows a heart rate of 86, sinus rhythm, QTc 555 without any acute ischemic changes - Continue to monitor  Claudication in peripheral vascular disease [facility]) - Patient had vascular surgery last year per his report - CT scan shows occlusion of the left common internal and proximal iliac artery.  There is reconstitution of the external on the CTA - Consult vascular surgery - When med rec is complete, add back home medications -  Continue to monitor         Consultants: neurology, neurosurgery and pulmonary  Procedures performed: bronchoscopy with lymph node biopsy, right thoracentesis   Disposition: Home Diet recommendation:  Cardiac and Carb modified diet DISCHARGE MEDICATION: Allergies as of 11/13/2021       Reactions   Clopidogrel    Other reaction(s): Drowsy, Skin irritation        Medication List      STOP taking these medications    ciprofloxacin 500 MG tablet Commonly known as: CIPRO   gabapentin 300 MG capsule Commonly known as: NEURONTIN   insulin glargine-yfgn 100 UNIT/ML injection Commonly known as: SEMGLEE   meloxicam 15 MG tablet Commonly known as: MOBIC   metFORMIN 500 MG tablet Commonly known as: GLUCOPHAGE   sertraline 50 MG tablet Commonly known as: ZOLOFT   sildenafil 50 MG tablet Commonly known as: Viagra   tadalafil 20 MG tablet Commonly known as: CIALIS   traMADol 50 MG tablet Commonly known as: ULTRAM       TAKE these medications    aspirin EC 81 MG tablet Take 1 tablet (81 mg total) by mouth daily. Swallow whole.   atorvastatin 40 MG tablet Commonly known as: LIPITOR Take 1 tablet (40 mg total) by mouth at bedtime.   carvedilol 3.125 MG tablet Commonly known as: COREG Take 1 tablet (3.125 mg total) by mouth 2 (two) times daily with a meal.   feeding supplement Liqd Take 237 mLs by mouth 2 (two) times daily between meals. Start taking on: November 14, 2021   furosemide 40 MG tablet Commonly known as: LASIX Take 1 tablet (40 mg total) by mouth daily.   Janumet 50-500 MG tablet Generic drug: sitaGLIPtin-metformin Take 1 tablet by mouth 2 (two) times daily.        Discharge Exam: Filed Weights   11/10/21 0500 11/11/21 0500 11/12/21 0500  Weight: 91.1 kg 92.1 kg 97.1 kg   BP 110/70   Pulse 76   Temp 98 F (36.7 C) (Oral)   Resp 19   Ht _0  (1.753 m)   Wt 97.1 kg   SpO2 98%   BMI 31.61 kg/m   Patient is feeling well, no chest pain and no dyspnea  Neurology awake and alert ENT with no pallor Cardiovascular with S1 and S2 present and rhythmic with no gallops, rubs or murmurs Respiratory with no rales or wheezing Abdomen with no distention  Trace lower extremity edema   Condition at discharge: stable  The results of significant diagnostics from this hospitalization (including imaging, microbiology, ancillary and  laboratory) are listed below for reference.   Imaging Studies: DG Chest Port 1 View  Result Date: 11/12/2021 CLINICAL DATA:  Shortness of breath. EXAM: PORTABLE CHEST 1 VIEW COMPARISON:  November 10, 2021. FINDINGS: Stable cardiomegaly. Stable bilateral lung opacities are noted concerning for pulmonary edema. Bony thorax is unremarkable. IMPRESSION: Stable cardiomegaly. Stable bilateral lung opacities are noted concerning for edema. Electronically Signed   By: Marijo Conception M.D.   On: 11/12/2021 08:22   DG Chest 1 View  Result Date: 11/10/2021 CLINICAL DATA:  Status post thoracentesis EXAM: CHEST  1 VIEW COMPARISON:  Chest radiograph 11/07/2021 FINDINGS: The heart is enlarged, unchanged. The upper mediastinal contours are stable. The right pleural effusion appears decreased in size following thoracentesis. There is no appreciable pneumothorax. There is no significant left effusion. There is no left pneumothorax. There is no new or worsening focal airspace disease. There is unchanged probable mild  pulmonary interstitial edema. There is no acute osseous abnormality. IMPRESSION: Decreased size of the right pleural effusion following thoracentesis. No appreciable pneumothorax. Electronically Signed   By: Valetta Mole M.D.   On: 11/10/2021 14:38   MR BRAIN W WO CONTRAST  Result Date: 11/09/2021 CLINICAL DATA:  Initial evaluation for neuro deficit, stroke suspected. EXAM: MRI HEAD WITHOUT AND WITH CONTRAST TECHNIQUE: Multiplanar, multiecho pulse sequences of the brain and surrounding structures were obtained without and with intravenous contrast. CONTRAST:  35m GADAVIST GADOBUTROL 1 MMOL/ML IV SOLN COMPARISON:  Prior brain MRI from earlier the same day. FINDINGS: Brain: Examination severely degraded by motion artifact, limiting assessment. Age-related cerebral atrophy with chronic microvascular ischemic disease. Chronic encephalomalacia with hemosiderin staining at the right occipital lobe, consistent with  a chronic right PCA distribution infarct. No evidence for acute or recent infarction. No visible acute intracranial hemorrhage. Few chronic micro hemorrhages noted about the pons and left lentiform nucleus, likely hypertensive in nature. There is an enhancing mass involving the medial aspect of the anterior right temporal horn measuring 2.1 x 2.0 x 1.8 cm (AP by transverse by craniocaudad). Although somewhat difficult to be certain given motion artifact on this exam, this is favored to be intra-axial in location. Small amount of associated susceptibility artifact suggestive of blood products and/or necrosis. T2/FLAIR intensity within the adjacent right temporal lobe consistent with vasogenic edema and/or infiltrating nonenhancing tumor. No significant midline shift. Adjacent basilar cisterns remain patent. No other visible mass lesion or abnormal enhancement. No hydrocephalus. No convincing extra-axial fluid collection seen on this exam. Vascular: Major intracranial vascular flow voids are grossly maintained. Skull and upper cervical spine: Craniocervical junction within normal limits. Bone marrow signal intensity grossly normal. No visible focal marrow replacing lesion. No scalp soft tissue abnormality. Sinuses/Orbits: Globes orbital soft tissues within normal limits. Mild scattered mucosal thickening noted about the ethmoidal air cells and maxillary sinuses. Small to moderate bilateral mastoid effusions. Visualized nasopharynx grossly negative. Other: None. IMPRESSION: 1. Motion degraded exam. 2. 2.1 x 2.0 x 1.8 cm enhancing mass positioned at the medial right temporal horn. This is favored to be intra-axial in location, with primary differential considerations including a solitary intracranial metastasis versus a primary CNS neoplasm. Associated vasogenic edema and/or infiltrating nonenhancing tumor within the adjacent right temporal lobe. 3. No other acute intracranial abnormality. 4. Underlying atrophy with  chronic microvascular ischemic disease, with a small chronic right PCA distribution infarct. Electronically Signed   By: BJeannine BogaM.D.   On: 11/09/2021 05:27   EEG adult  Result Date: 11/08/2021 YLora Havens MD     11/08/2021  1:46 PM Patient Name: GDEMETRIAS GOODBARMRN: [id number] Attending: PLora HavensReferring Physician/Provider: LKerney Elbe MD Date: 11/08/2021 Duration: 22.21 mins Patient history: 89yoM with ams. EEG to evaluate for seizure Level of alertness: Awake AEDs during EEG study: None Technical aspects: This EEG study was done with scalp electrodes positioned according to the 10-20 International system of electrode placement. Electrical activity was reviewed with band pass filter of 1-_0 , sensitivity of 7 uV/mm, display speed of 335msec with a _1  notched filter applied as appropriate. EEG data were recorded continuously and digitally stored.  Video monitoring was available and reviewed as appropriate. Description: The posterior dominant rhythm consists of 7.5 Hz activity of moderate voltage (25-35 uV) seen predominantly in posterior head regions, symmetric and reactive to eye opening and eye closing. EEG showed intermittent generalized 3 to 6 Hz theta-delta slowing. Hyperventilation and photic stimulation  were not performed.   ABNORMALITY - Intermittent slow, generalized IMPRESSION: This study is suggestive of mild diffuse encephalopathy, nonspecific etiology. No seizures or epileptiform discharges were seen throughout the recording. Lora Havens   MR BRAIN WO CONTRAST  Result Date: 11/08/2021 CLINICAL DATA:  Fall, stroke suspected EXAM: MRI HEAD WITHOUT CONTRAST TECHNIQUE: Multiplanar, multiecho pulse sequences of the brain and surrounding structures were obtained without intravenous contrast. COMPARISON:  05/15/2019 MRI head, correlation is also made with CT head 11/07/2021 FINDINGS: Evaluation is quite limited by motion artifact. Brain: Mildly  increased signal on diffusion-weighted imaging in the left medial temporal lobe (series 5, image 66), with possible ADC correlate (series 6, image 18); this area is associated with diffusely increased T2 hyperintense signal (series 11, image 8-11) which extends posteriorly into the right temporal lobe. On the T2 sequence, there is a suggestion of a T2 hyperintense mass in or medial to the medial right temporal lobe (series 10, image 6 and 7 and series 14, image 18). Suspect trace subdural fluid along the right temporal horn (series 11, image 9), likely trace subdural hemorrhage. No hydrocephalus. Encephalomalacia in the right occipital lobe. Vascular: Grossly normal arterial flow voids. Skull and upper cervical spine: Normal marrow signal. Sinuses/Orbits: Clear paranasal sinuses. The orbits are unremarkable. Other: Fluid in the bilateral mastoid air cells. IMPRESSION: 1. Evaluation is quite limited by motion artifact. A repeat MRI with and without contrast is recommended when the patient is better able to tolerate the exam. 2. Possible mass in or medial to the right temporal lobe. Edema in the right temporal lobe may be related to the mass. 3. Mildly increased signal on diffusion-weighted imaging in the left medial temporal lobe, with possible ADC correlate, which could be artifactual or reflect a small acute or subacute infarct. 4. Suspect trace subdural hemorrhage along the right temporal horn. These results will be called to the ordering clinician or representative by the Radiologist Assistant, and communication documented in the PACS or [employer]. Electronically Signed   By: Merilyn Baba M.D.   On: 11/08/2021 00:40   DG CHEST PORT 1 VIEW  Result Date: 11/07/2021 CLINICAL DATA:  Shortness of breath, CHF EXAM: PORTABLE CHEST 1 VIEW COMPARISON:  Previous studies including the examination done earlier today FINDINGS: Transverse diameter of heart is increased. Central pulmonary vessels are prominent.  Increased interstitial markings are seen in parahilar regions and lower lung fields suggesting pulmonary edema. Small bilateral pleural effusions are seen, more so on the right side. Overall, no significant interval changes are noted in comparison with the immediate previous study. IMPRESSION: Cardiomegaly. CHF. Small bilateral pleural effusions, more so on the right side. Electronically Signed   By: Elmer Picker M.D.   On: 11/07/2021 16:18   ECHOCARDIOGRAM COMPLETE  Result Date: 11/07/2021    ECHOCARDIOGRAM REPORT   Patient Name:   KENNTH VANBENSCHOTEN Date of Exam: 11/07/2021 Medical Rec #:  [id number]        Height:       69.0 in Accession #:    [id number]       Weight:       224.9 lb Date of Birth:  [birth year]        BSA:          2.172 m Patient Age:    5 years         BP:           119/94 mmHg Patient Gender: M  HR:           81 bpm. Exam Location:  [facility] Procedure: 2D Echo, Cardiac Doppler and Color Doppler Indications:    Stroke I63.9  History:        Patient [name] prior history of Echocardiogram examinations, most                 recent 08/31/2020. Cardiomyopathy and CHF, CAD and Previous                 Myocardial Infarction, Stroke; Risk Factors:Hypertension,                 Diabetes, Dyslipidemia and Current Smoker. Acute metabolic                 encephalopathy. Hx of Apical mural thrombus. Cocaine use. S/P                 LAD DES June 2014.  Sonographer:    Alvino Chapel RCS Referring Phys: [id number] ASIA B [place]  Sonographer Comments: Somewhat difficult study due to patient moving around alot and c/o back pain. IMPRESSIONS  1. Left ventricular ejection fraction, by estimation, is 25 to 30%. The left ventricle [name] severely decreased function. The left ventricle demonstrates regional wall motion abnormalities (LAD and RCA infract pattern). There is moderate asymmetric left ventricular hypertrophy of the septal segment. Left ventricular diastolic parameters are consistent  with Grade III diastolic dysfunction (restrictive). There is the interventricular septum is flattened in diastole ('D' shaped left ventricle), consistent with right ventricular volume overload and the interventricular septum is flattened in systole, consistent with right ventricular pressure overload.There is no apical thrombus on contrast images with aneursysmal apex.  2. Right ventricular systolic function is low normal. The right ventricular size is normal. There is mildly elevated pulmonary artery systolic pressure. The estimated right ventricular systolic pressure is 39.0 mmHg.  3. The mitral valve is abnormal. No evidence of mitral valve regurgitation. No evidence of mitral stenosis.  4. The aortic valve was not well visualized. There is mild calcification of the aortic valve. Aortic valve regurgitation is not visualized. Aortic valve sclerosis is present, with no evidence of aortic valve stenosis.  5. The inferior vena cava is dilated in size with <50% respiratory variability, suggesting right atrial pressure of 15 mmHg. Comparison(s): LVEF worse that prior reporting. FINDINGS  Left Ventricle: Left ventricular ejection fraction, by estimation, is 25 to 30%. The left ventricle [name] severely decreased function. The left ventricle demonstrates regional wall motion abnormalities. Definity contrast agent was given IV to delineate the left ventricular endocardial borders. The left ventricular internal cavity size was normal in size. There is moderate asymmetric left ventricular hypertrophy of the septal segment. The interventricular septum is flattened in diastole ('D' shaped left  ventricle), consistent with right ventricular volume overload and the interventricular septum is flattened in systole, consistent with right ventricular pressure overload. Left ventricular diastolic parameters are consistent with Grade III diastolic dysfunction (restrictive).  LV Wall Scoring: The entire apex and mid inferoseptal segment  are akinetic. The mid anteroseptal segment, mid inferolateral segment, mid anterolateral segment, mid anterior segment, and mid inferior segment are hypokinetic. Right Ventricle: The right ventricular size is normal. No increase in right ventricular wall thickness. Right ventricular systolic function is low normal. There is mildly elevated pulmonary artery systolic pressure. The tricuspid regurgitant velocity is 2.73 m/s, and with an assumed right atrial pressure of 15 mmHg, the estimated right ventricular systolic pressure is 30.0 mmHg.  Left Atrium: Left atrial size was normal in size. Right Atrium: Right atrial size was normal in size. Pericardium: Trivial pericardial effusion is present. Mitral Valve: The mitral valve is abnormal. No evidence of mitral valve regurgitation. No evidence of mitral valve stenosis. Tricuspid Valve: The tricuspid valve is normal in structure. Tricuspid valve regurgitation is trivial. No evidence of tricuspid stenosis. Aortic Valve: The aortic valve was not well visualized. There is mild calcification of the aortic valve. Aortic valve regurgitation is not visualized. Aortic valve sclerosis is present, with no evidence of aortic valve stenosis. Pulmonic Valve: The pulmonic valve was normal in structure. Pulmonic valve regurgitation is trivial. No evidence of pulmonic stenosis. Aorta: The aortic root is normal in size and structure and the ascending aorta was not well visualized. Venous: The inferior vena cava is dilated in size with less than 50% respiratory variability, suggesting right atrial pressure of 15 mmHg. IAS/Shunts: No atrial level shunt detected by color flow Doppler.  LEFT VENTRICLE PLAX 2D LVIDd:         5.40 cm   Diastology LVIDs:         4.60 cm   LV e' medial:    3.50 cm/s LV PW:         1.10 cm   LV E/e' medial:  25.1 LV IVS:        1.30 cm   LV e' lateral:   3.50 cm/s LVOT diam:     2.00 cm   LV E/e' lateral: 25.1 LV SV:         49 LV SV Index:   23 LVOT Area:     3.14  cm  RIGHT VENTRICLE RV S prime:     9.49 cm/s TAPSE (M-mode): 1.6 cm LEFT ATRIUM             Index        RIGHT ATRIUM           Index LA diam:        4.40 cm 2.03 cm/m   RA Area:     19.90 cm LA Vol (A2C):   59.8 ml 27.54 ml/m  RA Volume:   59.70 ml  27.49 ml/m LA Vol (A4C):   68.7 ml 31.64 ml/m LA Biplane Vol: 63.9 ml 29.43 ml/m  AORTIC VALVE LVOT Vmax:   91.40 cm/s LVOT Vmean:  55.900 cm/s LVOT VTI:    0.157 m  AORTA Ao Root diam: 3.50 cm MITRAL VALVE               TRICUSPID VALVE MV Area (PHT): 5.54 cm    TR Peak grad:   29.8 mmHg MV Decel Time: 137 msec    TR Vmax:        273.00 cm/s MV E velocity: 87.80 cm/s MV A velocity: 37.70 cm/s  SHUNTS MV E/A ratio:  2.33        Systemic VTI:  0.16 m                            Systemic Diam: 2.00 cm Rudean Haskell MD Electronically signed by Rudean Haskell MD Signature Date/Time: 11/07/2021/12:57:22 PM    Final    CT CHEST ABDOMEN PELVIS W CONTRAST  Result Date: 11/07/2021 CLINICAL DATA:  [place], [place] [id number] Trauma [id number]. Low back pain. Respiratory distress. EXAM: CT CHEST, ABDOMEN, AND PELVIS WITH CONTRAST TECHNIQUE: Multidetector CT imaging of the chest, abdomen and pelvis was performed following the standard  protocol during bolus administration of intravenous contrast. RADIATION DOSE REDUCTION: This exam was performed according to the departmental dose-optimization program which includes automated exposure control, adjustment of the mA and/or kV according to patient size and/or use of iterative reconstruction technique. CONTRAST:  160m OMNIPAQUE IOHEXOL 300 MG/ML  SOLN COMPARISON:  05/18/2019 FINDINGS: CT CHEST FINDINGS Cardiovascular: Multi-vessel long 7 coronary artery stenting [name] been performed. Global cardiac size is mildly enlarged. No pericardial effusion. Central pulmonary arteries are of normal caliber. Mild atherosclerotic calcification within the thoracic aorta. No aortic aneurysm. Mediastinum/Nodes: There is pathologic  mediastinal adenopathy identified, new since prior examination with index lymph nodes within the right paratracheal lymph node group measuring up to 21 x 24 mm at axial image # 15/2. Visualized thyroid is unremarkable. Esophagus is unremarkable. Lungs/Pleura: Large right and small left pleural effusions are present. Ground-glass pulmonary infiltrate and smooth interlobular septal thickening is seen within the aerated lungs in keeping with mild to moderate pulmonary edema, possibly cardiogenic in nature. No pneumothorax. No central obstructing lesion. Musculoskeletal: No chest wall mass or suspicious bone lesions identified. CT ABDOMEN PELVIS FINDINGS Hepatobiliary: No focal liver abnormality is seen. No gallstones, gallbladder wall thickening, or biliary dilatation. Pancreas: Unremarkable Spleen: Unremarkable Adrenals/Urinary Tract: Adrenal glands are unremarkable. Kidneys are normal, without renal calculi, focal lesion, or hydronephrosis. Bladder is unremarkable. Stomach/Bowel: Stomach, small bowel, and large bowel are unremarkable. Appendix not clearly identified and may be absent. Mild ascites is present. No free intraperitoneal gas. Vascular/Lymphatic: Extensive mixed atheromatous plaque is seen within the infrarenal abdominal aorta. Left common, internal, and proximal external iliac arteries are thrombosed. Reconstitution of the left external iliac artery is seen via the inferior epigastric artery. 2.1 x 3.3 cm fusiform right common iliac artery aneurysm is present demonstrating moderate mural thrombus. No pathologic adenopathy within the abdomen and pelvis. Reproductive: Prostate is unremarkable. Other: Mild diffuse subcutaneous edema within the abdominal wall. No abdominal wall hernia. Musculoskeletal: Osseous structures are age-appropriate. No acute bone abnormality. No lytic or blastic bone lesion. IMPRESSION: 1. No acute intrathoracic or intra-abdominal injury. 2. Pathologic mediastinal adenopathy, new  since prior examination, suspicious for a lymphoproliferative process. PET CT examination or short-term follow-up CT examination in 3 months once the patient's acute issues have resolved may be helpful for further evaluation. 3. Mild to moderate pulmonary edema, possibly cardiogenic in nature. Large right and small left pleural effusions. Mild ascites and diffuse subcutaneous edema within the abdominal wall in keeping with mild anasarca. 4. Mild cardiomegaly. Multi-vessel coronary artery stenting. 5. Peripheral vascular disease with occlusion of the left common, internal, and proximal external iliac arteries. Reconstitution of the left external iliac artery via the inferior epigastric artery. 6. 3.3 cm fusiform right common iliac artery aneurysm. Aortic Atherosclerosis (ICD10-I70.0). Electronically Signed   By: AFidela SalisburyM.D.   On: 11/07/2021 04:23   CT Head Wo Contrast  Result Date: 11/07/2021 CLINICAL DATA:  Low back pain; patient is concerned he may have a UTI; increased work of breathing EXAM: CT HEAD WITHOUT CONTRAST CT CERVICAL SPINE WITHOUT CONTRAST TECHNIQUE: Multidetector CT imaging of the head and cervical spine was performed following the standard protocol without intravenous contrast. Multiplanar CT image reconstructions of the cervical spine were also generated. RADIATION DOSE REDUCTION: This exam was performed according to the departmental dose-optimization program which includes automated exposure control, adjustment of the mA and/or kV according to patient size and/or use of iterative reconstruction technique. COMPARISON:  CT head 05/14/2019, CT cervical spine 10/15/2018 FINDINGS: CT HEAD FINDINGS  Brain: Hypodensity in the right temporal lobe, which is new from the prior exam (series 2, images 2-14). The previously noted hypodensity in the right occipital lobe [name] become more hypodense, consistent with expected evolution of a now remote infarct. No evidence of acute scratch hemorrhage,  mass, mass effect, or midline shift. No hydrocephalus or extra-axial fluid collection. Periventricular white matter changes, likely the sequela of chronic small vessel ischemic disease. Vascular: No hyperdense vessel. Skull: Normal. Negative for fracture or focal lesion. Sinuses/Orbits: Minimal mucosal thickening in the maxillary sinuses. The orbits are unremarkable. Other: The mastoid air cells are well aerated. CT CERVICAL SPINE FINDINGS Alignment: Reversal of the normal cervical lordosis.  No listhesis. Skull base and vertebrae: No acute fracture. No primary bone lesion or focal pathologic process. Soft tissues and spinal canal: No prevertebral fluid or swelling. No visible canal hematoma. Disc levels: Multilevel degenerative disc disease with disc height loss C3-C7. No obvious spinal canal stenosis. Upper chest: Please see same-day CT chest Other: None. IMPRESSION: 1. Hypodensity in the right temporal lobe, which is new from the prior exam, concerning for infarct, favored to be subacute. CTA or MRI is recommended for further evaluation. 2. No acute fracture or traumatic listhesis in the cervical spine. These results were called by telephone at the time of interpretation on 11/07/2021 at 4:12 am to provider DAVID [facility] , who verbally acknowledged these results. Electronically Signed   By: Merilyn Baba M.D.   On: 11/07/2021 04:13   CT Cervical Spine Wo Contrast  Result Date: 11/07/2021 CLINICAL DATA:  Low back pain; patient is concerned he may have a UTI; increased work of breathing EXAM: CT HEAD WITHOUT CONTRAST CT CERVICAL SPINE WITHOUT CONTRAST TECHNIQUE: Multidetector CT imaging of the head and cervical spine was performed following the standard protocol without intravenous contrast. Multiplanar CT image reconstructions of the cervical spine were also generated. RADIATION DOSE REDUCTION: This exam was performed according to the departmental dose-optimization program which includes automated exposure  control, adjustment of the mA and/or kV according to patient size and/or use of iterative reconstruction technique. COMPARISON:  CT head 05/14/2019, CT cervical spine 10/15/2018 FINDINGS: CT HEAD FINDINGS Brain: Hypodensity in the right temporal lobe, which is new from the prior exam (series 2, images 2-14). The previously noted hypodensity in the right occipital lobe [name] become more hypodense, consistent with expected evolution of a now remote infarct. No evidence of acute scratch hemorrhage, mass, mass effect, or midline shift. No hydrocephalus or extra-axial fluid collection. Periventricular white matter changes, likely the sequela of chronic small vessel ischemic disease. Vascular: No hyperdense vessel. Skull: Normal. Negative for fracture or focal lesion. Sinuses/Orbits: Minimal mucosal thickening in the maxillary sinuses. The orbits are unremarkable. Other: The mastoid air cells are well aerated. CT CERVICAL SPINE FINDINGS Alignment: Reversal of the normal cervical lordosis.  No listhesis. Skull base and vertebrae: No acute fracture. No primary bone lesion or focal pathologic process. Soft tissues and spinal canal: No prevertebral fluid or swelling. No visible canal hematoma. Disc levels: Multilevel degenerative disc disease with disc height loss C3-C7. No obvious spinal canal stenosis. Upper chest: Please see same-day CT chest Other: None. IMPRESSION: 1. Hypodensity in the right temporal lobe, which is new from the prior exam, concerning for infarct, favored to be subacute. CTA or MRI is recommended for further evaluation. 2. No acute fracture or traumatic listhesis in the cervical spine. These results were called by telephone at the time of interpretation on 11/07/2021 at 4:12 am to provider  DAVID Roxanne Mins , who verbally acknowledged these results. Electronically Signed   By: Merilyn Baba M.D.   On: 11/07/2021 04:13   DG Chest Port 1 View  Result Date: 11/07/2021 CLINICAL DATA:  Shortness of breath. EXAM:  PORTABLE CHEST 1 VIEW COMPARISON:  Chest radiograph dated 05/14/2019. FINDINGS: Cardiomegaly with vascular congestion and edema. Small right pleural effusion and right lung base atelectasis. Pneumonia is not excluded. No pneumothorax. No acute osseous pathology. Degenerative changes of the spine. IMPRESSION: 1. Cardiomegaly with vascular congestion and edema. 2. Small right pleural effusion. Electronically Signed   By: Anner Crete M.D.   On: 11/07/2021 01:22   DG Skull Complete  Result Date: 10/28/2021 CLINICAL DATA:  Patient fell MRI head 05/15/2019 EXAM: SKULL - COMPLETE 4 + VIEW COMPARISON:  None Available. FINDINGS: There is no evidence of skull fracture or other focal bone lesions. IMPRESSION: Negative. Electronically Signed   By: Placido Sou M.D.   On: 10/28/2021 23:04    Microbiology: Results for orders placed or performed during the hospital encounter of 11/07/21  Culture, blood (Routine X 2) w Reflex to ID Panel     Status: None   Collection Time: 11/07/21  1:30 AM   Specimen: BLOOD RIGHT FOREARM  Result Value Ref Range Status   Specimen Description   Final    BLOOD RIGHT FOREARM BOTTLES DRAWN AEROBIC AND ANAEROBIC   Special Requests Blood Culture adequate volume  Final   Culture   Final    NO GROWTH 5 DAYS Performed at [facility], [address], [place], [place] [address]    Report Status 11/12/2021 FINAL  Final  Culture, blood (Routine X 2) w Reflex to ID Panel     Status: None   Collection Time: 11/07/21  1:41 AM   Specimen: Site Not Specified; Blood  Result Value Ref Range Status   Specimen Description   Final    SITE NOT SPECIFIED BOTTLES DRAWN AEROBIC AND ANAEROBIC   Special Requests Blood Culture adequate volume  Final   Culture   Final    NO GROWTH 5 DAYS Performed at [facility], [address] [place], [place] [address]    Report Status 11/12/2021 FINAL  Final  Urine Culture     Status: None   Collection Time: 11/07/21  8:11 AM   Specimen: Urine,  Clean Catch  Result Value Ref Range Status   Specimen Description   Final    URINE, CLEAN CATCH Performed at [facility], [address], [place], [place] [address]    Special Requests   Final    NONE Performed at [facility], [address] [place], [place] [address]    Culture   Final    NO GROWTH Performed at [facility], [place] [address], [place], [place] [address]    Report Status 11/08/2021 FINAL  Final  Body fluid culture w Gram Stain     Status: None (Preliminary result)   Collection Time: 11/10/21  2:24 PM   Specimen: Pleura; Body Fluid  Result Value Ref Range Status   Specimen Description PLEURAL  Final   Special Requests RIGHT  Final   Gram Stain   Final    WBC PRESENT, PREDOMINANTLY MONONUCLEAR NO ORGANISMS SEEN CYTOSPIN SMEAR    Culture   Final    NO GROWTH 2 DAYS Performed at [facility], [address] [place], [place] [address]    Report Status PENDING  Incomplete  SARS Coronavirus 2 by RT PCR (  hospital order, performed in [facility] hospital lab) *cepheid single result test* Anterior Nasal Swab     Status: None   Collection Time: 11/11/21  7:29 PM   Specimen: Anterior Nasal Swab  Result Value Ref Range Status   SARS Coronavirus 2 by RT PCR NEGATIVE NEGATIVE Final    Comment: (NOTE) SARS-CoV-2 target nucleic acids are NOT DETECTED.  The SARS-CoV-2 RNA is generally detectable in upper and lower respiratory specimens during the acute phase of infection. The lowest concentration of SARS-CoV-2 viral copies this assay can detect is 250 copies / mL. A negative result does not preclude SARS-CoV-2 infection and should not be used as the sole basis for treatment or other patient management decisions.  A negative result may occur with improper specimen collection / handling, submission of specimen other than nasopharyngeal swab, presence of viral mutation(s) within the areas targeted by this assay, and inadequate number of viral copies (<250 copies  / mL). A negative result must be combined with clinical observations, patient history, and epidemiological information.  Fact Sheet for Patients:   https://www.patel.info/  Fact Sheet for Healthcare Providers: https://hall.com/  This test is not yet approved or  cleared by the [place] FDA and [name] been authorized for detection and/or diagnosis of SARS-CoV-2 by FDA under an Emergency Use Authorization (EUA).  This EUA will remain in effect (meaning this test can be used) for the duration of the COVID-19 declaration under Section 564(b)(1) of the Act, 21 U.S.C. section 360bbb-3(b)(1), unless the authorization is terminated or revoked sooner.  Performed at [facility], [place] [address] [place], [place] [address]     Labs: CBC: Recent Labs  Lab 11/07/21 0141 11/10/21 0347 11/13/21 0306  WBC 11.0* 9.0 10.0  HGB 11.8* 12.8* 12.3*  HCT 38.6* 41.9 40.9  MCV 81.4 79.7* 81.5  PLT 265 268 846   Basic Metabolic Panel: Recent Labs  Lab 11/07/21 0141 11/10/21 0347 11/11/21 0332 11/12/21 0241 11/13/21 0306  NA 139 138 138 138 137  K 3.7 3.6 3.7 3.9 4.8  CL 107 101 100 97* 97*  CO2 _0 GLUCOSE 208* 111* 168* 147* 243*  BUN 18 21 26* 27* 38*  CREATININE 1.57* 1.53* 1.82* 1.78* 1.91*  CALCIUM 8.1* 8.1* 8.1* 8.5* 9.0  MG  --  1.8  --  1.9  --    Liver Function Tests: Recent Labs  Lab 11/07/21 0141  AST 25  ALT 24  ALKPHOS 30*  BILITOT 0.6  PROT 7.5  ALBUMIN 2.7*   CBG: Recent Labs  Lab 11/12/21 1708 11/12/21 1726 11/12/21 2115 11/13/21 0724 11/13/21 1236  GLUCAP 316* 258* 256* 222* 263*    Discharge time spent: greater than 30 minutes.  Signed: Tawni Millers, MD [facility] 11/13/2021

## 2021-11-13 NOTE — TOC Transition Note (Signed)
Transition of Care [facility]) - CM/SW Discharge Note   Patient Details  Name: JAIVION KINGSLEY MRN: [id number] Date of Birth: 10/22/1955  [facility] CM/SW Contact:  Carles Collet, RN Phone Number: 11/13/2021, 2:25 PM   Clinical Narrative:     [facility] that patient will DC today  Patient has needed DME at home.  No other TOC needs identified at this time    Barriers to Discharge: Continued Medical Work up   Patient Goals and CMS Choice Patient states their goals for this hospitalization and ongoing recovery are:: return home CMS Medicare.gov Compare Post Acute Care list provided to:: Patient Choice offered to / list presented to : Patient  Discharge Placement                       Discharge Plan and Services   Discharge Planning Services: CM Consult                      HH Arranged: PT, OT, RN, Social Work [facility] Agency: [place] Date [place]: 11/11/21 Time [place]: 1443 Representative spoke with at [place]: [place] ([place]) Interventions     Readmission Risk Interventions    05/21/2019   11:42 AM  Readmission Risk Prevention Plan  Transportation Screening Complete  PCP or Specialist Appt within 3-5 Days Not Complete  Not Complete comments rec for SNF  [place] or [place] Complete  Social Work Consult for [place] Planning/Counseling Complete  Palliative Care Screening Not Applicable  Medication Review [employer]) Referral to Pharmacy  PCP or Specialist appointment within 3-5 days of discharge Not Complete  PCP/Specialist Appt Not Complete comments plan for d/c to SNF  [place] or [place] Complete  SW Recovery Care/Counseling Consult Complete  [place] Not Applicable

## 2021-11-14 ENCOUNTER — Encounter (HOSPITAL_COMMUNITY): Payer: Self-pay | Admitting: Emergency Medicine

## 2021-11-15 LAB — BODY FLUID CULTURE W GRAM STAIN: Culture: NO GROWTH

## 2021-11-16 LAB — CYTOLOGY - NON PAP

## 2021-11-20 ENCOUNTER — Emergency Department (HOSPITAL_COMMUNITY)
Admission: EM | Admit: 2021-11-20 | Discharge: 2021-11-20 | Disposition: A | Discharge status: Home / Self Care | Payer: No Typology Code available for payment source | Attending: Student | Admitting: Student

## 2021-11-20 ENCOUNTER — Other Ambulatory Visit: Payer: Self-pay

## 2021-11-20 ENCOUNTER — Encounter (HOSPITAL_COMMUNITY): Payer: Self-pay

## 2021-11-20 DIAGNOSIS — Z79899 Other long term (current) drug therapy: Secondary | ICD-10-CM | POA: Insufficient documentation

## 2021-11-20 DIAGNOSIS — Z7982 Long term (current) use of aspirin: Secondary | ICD-10-CM | POA: Diagnosis not present

## 2021-11-20 DIAGNOSIS — N182 Chronic kidney disease, stage 2 (mild): Secondary | ICD-10-CM | POA: Insufficient documentation

## 2021-11-20 DIAGNOSIS — I509 Heart failure, unspecified: Secondary | ICD-10-CM | POA: Diagnosis not present

## 2021-11-20 DIAGNOSIS — I2510 Atherosclerotic heart disease of native coronary artery without angina pectoris: Secondary | ICD-10-CM | POA: Insufficient documentation

## 2021-11-20 DIAGNOSIS — N50812 Left testicular pain: Secondary | ICD-10-CM | POA: Diagnosis present

## 2021-11-20 DIAGNOSIS — F17210 Nicotine dependence, cigarettes, uncomplicated: Secondary | ICD-10-CM | POA: Insufficient documentation

## 2021-11-20 DIAGNOSIS — I130 Hypertensive heart and chronic kidney disease with heart failure and stage 1 through stage 4 chronic kidney disease, or unspecified chronic kidney disease: Secondary | ICD-10-CM | POA: Diagnosis not present

## 2021-11-20 DIAGNOSIS — E1122 Type 2 diabetes mellitus with diabetic chronic kidney disease: Secondary | ICD-10-CM | POA: Diagnosis not present

## 2021-11-20 HISTORY — DX: Cyst of epididymis: N50.3

## 2021-11-20 LAB — URINALYSIS, ROUTINE W REFLEX MICROSCOPIC
Bacteria, UA: NONE SEEN
Bilirubin Urine: NEGATIVE
Glucose, UA: NEGATIVE mg/dL
Ketones, ur: NEGATIVE mg/dL
Leukocytes,Ua: NEGATIVE
Nitrite: NEGATIVE
Protein, ur: >=300 mg/dL — AB
Specific Gravity, Urine: 1.022 (ref 1.005–1.030)
pH: 5 (ref 5.0–8.0)

## 2021-11-20 MED ORDER — GABAPENTIN 300 MG PO CAPS
300.0000 mg | ORAL_CAPSULE | Freq: Once | ORAL | Status: AC
  Administered 2021-11-20: 300 mg via ORAL
  Filled 2021-11-20: qty 1

## 2021-11-20 MED ORDER — TRAMADOL HCL 50 MG PO TABS
50.0000 mg | ORAL_TABLET | Freq: Once | ORAL | Status: AC
  Administered 2021-11-20: 50 mg via ORAL
  Filled 2021-11-20: qty 1

## 2021-11-20 MED ORDER — TRAMADOL HCL 50 MG PO TABS: 50.0000 mg | ORAL_TABLET | Freq: Four times a day (QID) | ORAL | 0 refills | Status: DC | PRN

## 2021-11-20 MED ORDER — OXYCODONE-ACETAMINOPHEN 5-325 MG PO TABS: 1.0000 | ORAL_TABLET | Freq: Four times a day (QID) | ORAL | 0 refills | Status: DC | PRN

## 2021-11-20 MED ORDER — GABAPENTIN 600 MG PO TABS: 300.0000 mg | ORAL_TABLET | Freq: Three times a day (TID) | ORAL | 0 refills | Status: DC

## 2021-11-20 NOTE — ED Provider Notes (Signed)
[facility] EMERGENCY DEPARTMENT Provider Note  CSN: [id number] Arrival date & time: 11/20/21 0023  Chief Complaint(s) Testicle Pain  HPI [name] is a 66 y.o. male with PMH epididymal cysts with chronic testicular pain and low back pain, CKD 2, ischemic cardiomyopathy, CHF, CAD who presents emergency department for evaluation of left testicle pain.  Patient had a recent hospitalization on 11/07/2021 for a CHF exacerbation in which they discontinued his gabapentin and tramadol that he has taken for years to control his testicle pain.  He states that he was not informed as to why they discontinue this medication and on chart review I do not see any documented reason as to why these were discontinued but I suspect they were likely discontinued in the setting of his confusion and somnolence during his recent CHF hospitalization.  He states that this does feel like his chronic testicular pain but worse in intensity.  Denies dysuria, increased frequency, penile discharge, chest pain, shortness of breath, abdominal pain or other systemic symptoms.   Past Medical History Past Medical History:  Diagnosis Date   Bulging lumbar disc    CAD (coronary artery disease) [phone]   a. prior LAD stenting. b. s/p DES to [facility] 08/2015. c. 04/2016 Cardiac cath at [facility]. Patent stent in the PLAD and RCA. Diffuse dLAD, OM2, and  RPDA disease. d. DES to PDA and distal RCA 03/2019    Chronic lower back pain    CKD (chronic kidney disease), stage II    Cocaine abuse ([place])    Cyst of epididymis    DM2 (diabetes mellitus, type 2) ([place])    Essential hypertension    GERD (gastroesophageal reflux disease)    Headache    History of pneumonia    Hyperlipidemia    Ischemic cardiomyopathy    LV (left ventricular) mural thrombus    Sleep apnea    Patient Active Problem List   Diagnosis Date Noted   Pleural effusion 11/10/2021   Abnormal CT of the chest 11/09/2021   Mediastinal lymphadenopathy  11/09/2021   Acute respiratory failure with hypoxia ([place]) 11/07/2021   Stroke (cerebrum) ([place]) 11/07/2021   Elevated troponin 62/69/4854   Acute metabolic encephalopathy 62/70/3500   Protein calorie malnutrition ([place]) 11/07/2021   Cardiac volume overload 93/81/8299   Acute systolic heart failure (HCC)    Scrotal pain; chronic, bilateral 07/05/2021   Microscopic hematuria 07/05/2021   Organic impotence 07/05/2021   Necrotizing fasciitis ([place]) 05/22/2019   Opiate overdose ([place])    Uncontrolled type 2 diabetes mellitus with hyperglycemia ([place]) 05/16/2019   Elevated lactic acid level    Acute renal failure superimposed on stage 3b chronic kidney disease ([place]) 05/15/2019   Cocaine abuse ([place]) 05/15/2019   Severe sepsis ([place]) 05/14/2019   Unstable angina ([place]) 10/16/2018   Apical mural thrombus 10/15/2018   Claudication in peripheral vascular disease ([place]) 11/30/2017   Peripheral arterial disease ([place]) 11/28/2017   Chest pain 11/03/2016   Constipation 11/03/2016   CKD (chronic kidney disease), stage II 08/02/2016   Cellulitis of right hand 08/02/2016   Chronic pain 08/02/2016   AKI (acute kidney injury) ([place]) 01/31/2016   Lobar pneumonia ([place]) 01/31/2016   Influenza A 01/31/2016   Diarrhea 01/30/2016   NSVT (nonsustained ventricular tachycardia) ([place]) 09/12/2015   Precordial chest pain 09/10/2015   Cocaine use 09/04/2015   Near syncope 06/29/2015   Essential hypertension 06/29/2015   Status post coronary artery stent placement 06/29/2015   Insulin dependent diabetes mellitus 06/29/2015  History of MI (myocardial infarction) 06/29/2015   Acute on chronic systolic CHF (congestive heart failure) ([place]) 06/29/2015   Musculoskeletal chest pain 05/27/2015   OSA on CPAP 01/21/2015   Rectal bleeding    Stage 3 chronic renal impairment associated with type 2 diabetes mellitus ([place]) 09/09/2013   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 09/09/2013   Coronary artery disease 09/09/2013    Cardiomyopathy, ischemic-EF 40-45% by echo 09/07/13 09/09/2013   S/P LAD DES June 2014 09/06/2013   Tobacco use disorder 09/06/2013   Hyperglycemia 08/22/2012   NSTEMI (non-ST elevated myocardial infarction) ([place]) 07/07/2012   Dyslipidemia 07/07/2012   Hypertensive heart disease    Home Medication(s) Prior to Admission medications   Medication Sig Start Date End Date Taking? Authorizing Provider  gabapentin (NEURONTIN) 600 MG tablet Take 0.5 tablets (300 mg total) by mouth 3 (three) times daily. 11/20/21 12/20/21 Yes Jonanthan Bolender, MD  oxyCODONE-acetaminophen (PERCOCET/ROXICET) 5-325 MG tablet Take 1 tablet by mouth every 6 (six) hours as needed for severe pain. 11/20/21  Yes Cordarrius Coad, MD  traMADol (ULTRAM) 50 MG tablet Take 1 tablet (50 mg total) by mouth every 6 (six) hours as needed for severe pain. 11/20/21  Yes Cypress Fanfan, MD  aspirin EC 81 MG tablet Take 1 tablet (81 mg total) by mouth daily. Swallow whole. 11/13/21 12/13/21  Arrien, Jimmy Picket, MD  atorvastatin (LIPITOR) 40 MG tablet Take 1 tablet (40 mg total) by mouth at bedtime. 11/13/21 12/13/21  Arrien, Jimmy Picket, MD  carvedilol (COREG) 3.125 MG tablet Take 1 tablet (3.125 mg total) by mouth 2 (two) times daily with a meal. 11/13/21 12/13/21  Arrien, Jimmy Picket, MD  feeding supplement (ENSURE ENLIVE / ENSURE PLUS) LIQD Take 237 mLs by mouth 2 (two) times daily between meals. 11/14/21 12/14/21  Arrien, Jimmy Picket, MD  furosemide (LASIX) 40 MG tablet Take 1 tablet (40 mg total) by mouth daily. 11/13/21 12/13/21  Arrien, Jimmy Picket, MD  JANUMET 50-500 MG tablet Take 1 tablet by mouth 2 (two) times daily. 11/13/21 12/13/21  Arrien, Jimmy Picket, MD                                                                                                                                    Past Surgical History Past Surgical History:  Procedure Laterality Date   APPENDECTOMY     BRONCHIAL NEEDLE ASPIRATION BIOPSY   11/12/2021   Procedure: BRONCHIAL NEEDLE ASPIRATION BIOPSIES;  Surgeon: Maryjane Hurter, MD;  Location: [facility] ENDOSCOPY;  Service: Pulmonary;;   CARDIAC CATHETERIZATION N/A 09/07/2015   Procedure: Left Heart Cath and Coronary Angiography;  Surgeon: Leonie Man, MD;  Location: [place] CV LAB;  Service: Cardiovascular;  Laterality: N/A;   CARDIAC CATHETERIZATION N/A 09/07/2015   Procedure: Coronary Stent Intervention;  Surgeon: Leonie Man, MD;  Location: [place] CV LAB;  Service: Cardiovascular;  Laterality: N/A;   CORONARY ANGIOGRAM  09/07/13  residual RCA and OM disease   CORONARY ANGIOPLASTY WITH STENT PLACEMENT     CORONARY STENT INTERVENTION N/A 10/16/2018   Procedure: CORONARY STENT INTERVENTION;  Surgeon: Burnell Blanks, MD;  Location: [place] CV LAB;  Service: Cardiovascular;  Laterality: N/A;   CORONARY STENT INTERVENTION N/A 03/11/2019   Procedure: CORONARY STENT INTERVENTION;  Surgeon: [name], MD;  Location: [place] CV LAB;  Service: Cardiovascular;  Laterality: N/A;   FRACTURE SURGERY     INCISION AND DRAINAGE OF WOUND Left 05/19/2019   Procedure: DEBRIDEMENT LEFT GROIN;  Surgeon: Georganna Skeans, MD;  Location: [place];  Service: General;  Laterality: Left;   INSERTION OF ILIAC STENT Left 11/30/2017   Left external illiac stent   INSERTION OF ILIAC STENT  11/30/2017   Procedure: Insertion Of Iliac Stent;  Surgeon: Lorretta Harp, MD;  Location: [place] CV LAB;  Service: Cardiovascular;;  Left external illiac stent   KNEE ARTHROSCOPY Left    KNEE SURGERY     "ligaments, cartilage; tendon, put a pin in" (11/30/2017)   LEFT HEART CATH Bilateral 07/08/2012   Procedure: LEFT HEART CATH;  Surgeon: [name], MD;  Location: [facility] CATH LAB;  Service: Cardiovascular;  Laterality: Bilateral;   LEFT HEART CATH AND CORONARY ANGIOGRAPHY N/A 10/16/2018   Procedure: LEFT HEART CATH AND CORONARY ANGIOGRAPHY;  Surgeon: Burnell Blanks, MD;   Location: [place] CV LAB;  Service: Cardiovascular;  Laterality: N/A;   LEFT HEART CATH AND CORONARY ANGIOGRAPHY N/A 03/11/2019   Procedure: LEFT HEART CATH AND CORONARY ANGIOGRAPHY;  Surgeon: [name], MD;  Location: [place] CV LAB;  Service: Cardiovascular;  Laterality: N/A;   LEFT HEART CATHETERIZATION WITH CORONARY ANGIOGRAM N/A 09/06/2013   STEMI, 2nd ISR LAD. Procedure: LEFT HEART CATHETERIZATION WITH CORONARY ANGIOGRAM;  Surgeon: [name], MD;  Location: [facility] CATH LAB;  Service: Cardiovascular;  Laterality: N/A;   LOWER EXTREMITY ANGIOGRAPHY N/A 11/30/2017   Procedure: LOWER EXTREMITY ANGIOGRAPHY;  Surgeon: Lorretta Harp, MD;  Location: [place] CV LAB;  Service: Cardiovascular;  Laterality: N/A;   PERCUTANEOUS CORONARY STENT INTERVENTION (PCI-S)  07/08/2012   Procedure: PERCUTANEOUS CORONARY STENT INTERVENTION (PCI-S);  Surgeon: [name], MD;  Location: [facility] CATH LAB;  Service: Cardiovascular;;  DES LAD   PERCUTANEOUS CORONARY STENT INTERVENTION (PCI-S) N/A 09/06/2013   Procedure: PERCUTANEOUS CORONARY STENT INTERVENTION (PCI-S);  Surgeon: [name], MD;  Location: [facility] CATH LAB;  Service: Cardiovascular;  Laterality: N/A;  Mid LAD 3.0/24mm Promus   THORACENTESIS Right 11/10/2021   Procedure: THORACENTESIS;  Surgeon: Collene Gobble, MD;  Location: [facility] ENDOSCOPY;  Service: Cardiopulmonary;  Laterality: Right;   VIDEO BRONCHOSCOPY WITH ENDOBRONCHIAL ULTRASOUND Right 11/12/2021   Procedure: VIDEO BRONCHOSCOPY WITH ENDOBRONCHIAL ULTRASOUND;  Surgeon: Maryjane Hurter, MD;  Location: [facility] ENDOSCOPY;  Service: Pulmonary;  Laterality: Right;   WOUND DEBRIDEMENT Left 05/20/2019   Procedure: DEBRIDEMENT GROIN;  Surgeon: Kinsinger, Arta Bruce, MD;  Location: [place];  Service: General;  Laterality: Left;   WRIST FRACTURE SURGERY Bilateral    Family History Family History  Problem Relation Age of Onset   Hypertension Mother    Diabetes Mother      Social History Social History   Tobacco Use   Smoking status: Every Day    Packs/day: 0.50    Years: 48.00    Total pack years: 24.00    Types: Cigarettes   Smokeless tobacco: Never  Vaping Use   Vaping Use: Never used  Substance Use Topics   Alcohol use: Not Currently    Comment: 11/30/2017 "might drink a beer q 6 months"   Drug use: Never   Allergies Clopidogrel  Review of Systems Review of Systems  Genitourinary:  Positive for testicular pain.    Physical Exam Vital Signs  I have reviewed the triage vital signs BP (!) 146/74   Pulse 75   Temp 98.1 F (36.7 C) (Oral)   Resp 20   Ht '5\' 9"'$  (1.753 m)   Wt 97.1 kg   SpO2 95%   BMI 31.61 kg/m   Physical Exam Constitutional:      General: He is not in acute distress.    Appearance: Normal appearance.  HENT:     Head: Normocephalic and atraumatic.     Nose: No congestion or rhinorrhea.  Eyes:     General:        Right eye: No discharge.        Left eye: No discharge.     Extraocular Movements: Extraocular movements intact.     Pupils: Pupils are equal, round, and reactive to light.  Cardiovascular:     Rate and Rhythm: Normal rate and regular rhythm.     Heart sounds: No murmur heard. Pulmonary:     Effort: No respiratory distress.     Breath sounds: No wheezing or rales.  Abdominal:     General: There is no distension.     Tenderness: There is no abdominal tenderness.  Genitourinary:    Comments: Left testicular tenderness Musculoskeletal:        General: Normal range of motion.     Cervical back: Normal range of motion.  Skin:    General: Skin is warm and dry.  Neurological:     General: No focal deficit present.     Mental Status: He is alert.     ED Results and Treatments Labs (all labs ordered are listed, but only abnormal results are displayed) Labs Reviewed  URINALYSIS, ROUTINE W REFLEX MICROSCOPIC - Abnormal; Notable for the following components:      Result Value   Hgb urine  dipstick SMALL (*)    Protein, ur >=300 (*)    All other components within normal limits                                                                                                                          Radiology No results found.  Pertinent labs & imaging results that were available during my care of the patient were reviewed by me and considered in my medical decision making (see MDM for details).  Medications Ordered in ED Medications  traMADol (ULTRAM) tablet 50 mg (50 mg Oral Given 11/20/21 0318)  gabapentin (NEURONTIN) capsule 300 mg (300 mg Oral Given 11/20/21 0318)  Procedures Procedures  (including critical care time)  Medical Decision Making / ED Course   This patient presents to the ED for concern of testicular pain, this involves an extensive number of treatment options, and is a complaint that carries with it a high risk of complications and morbidity.  The differential diagnosis includes acute on chronic testicular pain, epididymal cyst, torsion, varicocele  MDM: Patient seen emergency room for evaluation of left testicular pain.  Physical exam with mild tenderness over the left testicle but no abnormal lie, no elevation and cremasteric reflexes intact.  Urinalysis unremarkable.  I gave the patient his previous doses of tramadol and gabapentin and patient states his symptoms have completely resolved.  We do not have testicular ultrasound available overnight and his clinical exam is not consistent with testicular torsion and thus we shared decision making that the patient will follow-up with urology and I will provide a brief prescription for his tramadol and gabapentin.  Patient then discharged with outpatient urology follow-up.   Additional history obtained: -Additional history obtained from wife -External records from outside source  obtained and reviewed including: Chart review including previous notes, labs, imaging, consultation notes   Lab Tests: -I ordered, reviewed, and interpreted labs.   The pertinent results include:   Labs Reviewed  URINALYSIS, ROUTINE W REFLEX MICROSCOPIC - Abnormal; Notable for the following components:      Result Value   Hgb urine dipstick SMALL (*)    Protein, ur >=300 (*)    All other components within normal limits     Medicines ordered and prescription drug management: Meds ordered this encounter  Medications   traMADol (ULTRAM) tablet 50 mg   gabapentin (NEURONTIN) capsule 300 mg   gabapentin (NEURONTIN) 600 MG tablet    Sig: Take 0.5 tablets (300 mg total) by mouth 3 (three) times daily.    Dispense:  45 tablet    Refill:  0   traMADol (ULTRAM) 50 MG tablet    Sig: Take 1 tablet (50 mg total) by mouth every 6 (six) hours as needed for severe pain.    Dispense:  21 tablet    Refill:  0   oxyCODONE-acetaminophen (PERCOCET/ROXICET) 5-325 MG tablet    Sig: Take 1 tablet by mouth every 6 (six) hours as needed for severe pain.    Dispense:  6 tablet    Refill:  0    -I have reviewed the patients home medicines and have made adjustments as needed  Critical interventions none    Cardiac Monitoring: The patient was maintained on a cardiac monitor.  I personally viewed and interpreted the cardiac monitored which showed an underlying rhythm of: NSR  Social Determinants of Health:  Factors impacting patients care include: none   Reevaluation: After the interventions noted above, I reevaluated the patient and found that they have :improved  Co morbidities that complicate the patient evaluation  Past Medical History:  Diagnosis Date   Bulging lumbar disc    CAD (coronary artery disease) [id number]   a. prior LAD stenting. b. s/p DES to [facility] 08/2015. c. 04/2016 Cardiac cath at [facility]. Patent stent in the PLAD and RCA. Diffuse dLAD, OM2, and  RPDA disease. d. DES  to PDA and distal RCA 03/2019    Chronic lower back pain    CKD (chronic kidney disease), stage II    Cocaine abuse ([place])    Cyst of epididymis    DM2 (diabetes mellitus, type 2) ([place])    Essential hypertension  GERD (gastroesophageal reflux disease)    Headache    History of pneumonia    Hyperlipidemia    Ischemic cardiomyopathy    LV (left ventricular) mural thrombus    Sleep apnea       Dispostion: I considered admission for this patient, but he currently does not meet inpatient criteria for admission and is safe for discharge with outpatient follow-up     Final Clinical Impression(s) / ED Diagnoses Final diagnoses:  Pain in left testicle     '@PCDICTATION'$ @    Teressa Lower, MD 11/20/21 [phone]

## 2021-11-20 NOTE — Discharge Instructions (Addendum)
You were seen in the emergency room for evaluation of testicular pain.  This appears to be a longstanding problem for you and on exam it does not appear that you have clinical evidence of testicular torsion.  We do not have ultrasound available in the middle the night unfortunately and you will need to follow-up with your urologist to obtain an outpatient ultrasound.  On your last hospital admission you were taken off of the medications you have been using for a long time to control this pain after a single dose of this medication your testicular pain appears to have resolved.  I will briefly restart this medication but you will need to contact your primary care physician for long-term prescriptions if required.  Return the emergency department if you have new or worsening testicular pain, persistent vomiting or any other concerning symptoms

## 2021-11-20 NOTE — ED Triage Notes (Addendum)
Pt reports left scrotal pain that is chronic in nature, pt with hx of epididymal Cyst -  pt has been taking tramadol and gabapentin for pain relief for "long time" but was taken off these medications when he was discharged from [facility] last week. Pt is here because his pain isn't controlled and he wants to start these medications again.

## 2021-11-22 MED FILL — Oxycodone w/ Acetaminophen Tab 5-325 MG: ORAL | Qty: 6 | Status: AC

## 2021-11-24 ENCOUNTER — Telehealth: Payer: Self-pay

## 2021-11-24 NOTE — Telephone Encounter (Signed)
        Patient  visited [facility] F[facility] on 11/11    Telephone encounter attempt :  1st  A HIPAA compliant voice message was left requesting a return call.  Instructed patient to call back    [place] [employer]  [phone] [address], [place], [place] [address] Phone: [phone] Email: [email or web address]

## 2021-11-28 ENCOUNTER — Other Ambulatory Visit: Payer: Self-pay

## 2021-11-28 ENCOUNTER — Encounter (HOSPITAL_COMMUNITY): Payer: Self-pay

## 2021-11-28 ENCOUNTER — Emergency Department (HOSPITAL_COMMUNITY)
Admission: EM | Admit: 2021-11-28 | Discharge: 2021-11-28 | Disposition: A | Discharge status: Home / Self Care | Payer: No Typology Code available for payment source | Attending: Emergency Medicine | Admitting: Emergency Medicine

## 2021-11-28 DIAGNOSIS — I509 Heart failure, unspecified: Secondary | ICD-10-CM | POA: Diagnosis not present

## 2021-11-28 DIAGNOSIS — E1122 Type 2 diabetes mellitus with diabetic chronic kidney disease: Secondary | ICD-10-CM | POA: Diagnosis not present

## 2021-11-28 DIAGNOSIS — Z7982 Long term (current) use of aspirin: Secondary | ICD-10-CM | POA: Insufficient documentation

## 2021-11-28 DIAGNOSIS — I2510 Atherosclerotic heart disease of native coronary artery without angina pectoris: Secondary | ICD-10-CM | POA: Insufficient documentation

## 2021-11-28 DIAGNOSIS — N50812 Left testicular pain: Secondary | ICD-10-CM | POA: Insufficient documentation

## 2021-11-28 DIAGNOSIS — G8929 Other chronic pain: Secondary | ICD-10-CM | POA: Insufficient documentation

## 2021-11-28 DIAGNOSIS — N189 Chronic kidney disease, unspecified: Secondary | ICD-10-CM | POA: Diagnosis not present

## 2021-11-28 DIAGNOSIS — Z79899 Other long term (current) drug therapy: Secondary | ICD-10-CM | POA: Diagnosis not present

## 2021-11-28 NOTE — ED Triage Notes (Signed)
Pt presents with chronic L testicle pain. Pt has a f/u  appointment tomorrow with PCP. Pt reports the pain is worse today and is requesting pain control.

## 2021-11-28 NOTE — ED Notes (Signed)
Pt took home Gabapentin '300mg'$  and tramadol '50mg'$ 

## 2021-11-28 NOTE — Discharge Instructions (Signed)
Please follow-up closely with your urologist for further assessment of your recurrent left testicle pain.  Continue to take your home pain medication as prescribed.  Return if you have any concern.

## 2021-11-28 NOTE — ED Provider Notes (Signed)
[facility] EMERGENCY DEPARTMENT Provider Note   CSN: [id number] Arrival date & time: 11/28/21  1029     History  Chief Complaint  Patient presents with   Testicle Pain    [name] is a 66 y.o. male.  The history is provided by the patient and medical records. No language interpreter was used.  Testicle Pain     66 year old male significant history of chronic lower back pain, epididymal cysts, diabetes, cocaine abuse, CKD, CAD, chronic left testicular pain presenting complaint of testicular discomfort.  Patient had a recent hospitalization on 11/07/2021 for CHF exacerbation.  During his hospitalization he had his gabapentin and tramadol discontinued.  These medications that he takes chronically for his left testicular pain.  It was thought that the medication was removed due to his increased confusion and somnolence during his hospitalization.  Since being without his medication patient has had progressive worsening left testicle pain.  He was seen in the ED on 11/11 for similar complaint.  At that time he was given tramadol and gabapentin in the ER with improvement of symptoms.  He was subsequently discharged home with a short prescription for both medication and recommendation to follow-up with urologist.  Patient states he has a urology appointment tomorrow but his pain is not adequately controlled thus prompting this ER visit.  He also brought with him several packages at the [place] recently since that he has not opened up yet.  He does not endorse any fever, burning urination, or recent injury.  Home Medications Prior to Admission medications   Medication Sig Start Date End Date Taking? Authorizing Provider  aspirin EC 81 MG tablet Take 1 tablet (81 mg total) by mouth daily. Swallow whole. 11/13/21 12/13/21  Arrien, Jimmy Picket, MD  atorvastatin (LIPITOR) 40 MG tablet Take 1 tablet (40 mg total) by mouth at bedtime. 11/13/21 12/13/21  Arrien, Jimmy Picket, MD  carvedilol  (COREG) 3.125 MG tablet Take 1 tablet (3.125 mg total) by mouth 2 (two) times daily with a meal. 11/13/21 12/13/21  Arrien, Jimmy Picket, MD  feeding supplement (ENSURE ENLIVE / ENSURE PLUS) LIQD Take 237 mLs by mouth 2 (two) times daily between meals. 11/14/21 12/14/21  Arrien, Jimmy Picket, MD  furosemide (LASIX) 40 MG tablet Take 1 tablet (40 mg total) by mouth daily. 11/13/21 12/13/21  Arrien, Jimmy Picket, MD  gabapentin (NEURONTIN) 600 MG tablet Take 0.5 tablets (300 mg total) by mouth 3 (three) times daily. 11/20/21 12/20/21  Kommor, Madison, MD  JANUMET 50-500 MG tablet Take 1 tablet by mouth 2 (two) times daily. 11/13/21 12/13/21  Arrien, Jimmy Picket, MD  oxyCODONE-acetaminophen (PERCOCET/ROXICET) 5-325 MG tablet Take 1 tablet by mouth every 6 (six) hours as needed for severe pain. 11/20/21   Kommor, Madison, MD  traMADol (ULTRAM) 50 MG tablet Take 1 tablet (50 mg total) by mouth every 6 (six) hours as needed for severe pain. 11/20/21   Kommor, Debe Coder, MD      Allergies    Clopidogrel    Review of Systems   Review of Systems  Genitourinary:  Positive for testicular pain.  All other systems reviewed and are negative.   Physical Exam Updated Vital Signs BP 131/89 (BP Location: Left Arm)   Pulse 72   Temp 98 F (36.7 C) (Oral)   Resp 13   Ht '5\' 9"'$  (1.753 m)   Wt 99.8 kg   SpO2 96%   BMI 32.49 kg/m  Physical Exam Vitals and nursing note reviewed.  Constitutional:  General: He is not in acute distress.    Appearance: He is well-developed. He is obese.  HENT:     Head: Atraumatic.  Eyes:     Conjunctiva/sclera: Conjunctivae normal.  Abdominal:     Palpations: Abdomen is soft.     Tenderness: There is no abdominal tenderness.  Genitourinary:    Comments: Chaperone present for exam.  Left testicle with tenderness but with normal lie and normal scrotum.  Normal penile shaft. Musculoskeletal:     Cervical back: Neck supple.  Skin:    Findings: No rash.   Neurological:     Mental Status: He is alert.     ED Results / Procedures / Treatments   Labs (all labs ordered are listed, but only abnormal results are displayed) Labs Reviewed - No data to display  EKG None  Radiology No results found.  Procedures Procedures    Medications Ordered in ED Medications - No data to display  ED Course/ Medical Decision Making/ A&P                           Medical Decision Making  BP 131/89 (BP Location: Left Arm)   Pulse 72   Temp 98 F (36.7 C) (Oral)   Resp 13   Ht '5\' 9"'$  (1.753 m)   Wt 99.8 kg   SpO2 96%   BMI 32.49 kg/m   46:13 PM  66 year old male significant history of chronic lower back pain, epididymal cysts, diabetes, cocaine abuse, CKD, CAD, chronic left testicular pain presenting complaint of testicular discomfort.  Patient had a recent hospitalization on 11/07/2021 for CHF exacerbation.  During his hospitalization he had his gabapentin and tramadol discontinued.  These medications that he takes chronically for his left testicular pain.  It was thought that the medication was removed due to his increased confusion and somnolence during his hospitalization.  Since being without his medication patient has had progressive worsening left testicle pain.  He was seen in the ED on 11/11 for similar complaint.  At that time he was given tramadol and gabapentin in the ER with improvement of symptoms.  He was subsequently discharged home with a short prescription for both medication and recommendation to follow-up with urologist.  Patient states he has a urology appointment tomorrow but his pain is not adequately controlled thus prompting this ER visit.  He also brought with him several packages at the [place] recently since that he has not opened up yet.  He does not endorse any fever, burning urination, or recent injury.  On exam, patient has tenderness about his left testicle.  It is nonedematous, and with normal lie.  No hernia noted.  No  concerning rash.  Vital signs are reassuring, no fever, no hypoxia.   -The patient was maintained on a cardiac monitor.  I personally viewed and interpreted the cardiac monitored which showed an underlying rhythm of: NSR -Imaging independently viewed and interpreted by me and I agree with radiologist's  -This patient presents to the ED for concern of L testicle pain, this involves an extensive number of treatment options, and is a complaint that carries with it a high risk of complications and morbidity.  The differential diagnosis includes chronic L testicle pain, epididymal cyst, testicular torsion, hernia, scrotal abscess, epididymitis, orchitis -Co morbidities that complicate the patient evaluation includes chronic testicular pain 2/2 epidydymal cyst -Treatment includes home medication of tramadol and gabapentin -Reevaluation of the patient after these medicines showed  that the patient improved -PCP office notes or outside notes reviewed -Escalation to admission/observation considered: patients feels much better, is comfortable with discharge, and will follow up with PCP -Prescription medication considered, patient comfortable with taking his home pain medication -Social Determinant of Health considered which includes tobacco use, recommend cessation  2:40 PM Patient states sickle pain has been ongoing issue for the past several years.  He mention he is scheduled to follow-up with a urologist tomorrow.  He brought with him several mailed bags from the [place] that he has not open yet.  Upon opening the back, the content is consistent with patient's home medication which include gabapentin and tramadol from the [place].  Patient was allowed to take his home medication.  On reassessment patient reported improvement of pain symptoms and felt comfortable going home.  He will follow-up with the urologist tomorrow.  He understands to return if symptoms worsen.  I have consider labs and scrotal ultrasound as  part of the work-up but given his pain is chronic in nature and it did improve with his home medication, will defer further testing at this time.         Final Clinical Impression(s) / ED Diagnoses Final diagnoses:  Chronic pain in testicle    Rx / DC Orders ED Discharge Orders     None         Domenic Moras, PA-C 11/28/21 1449    Fredia Sorrow, MD 12/01/21 [phone]

## 2021-11-29 DIAGNOSIS — I5023 Acute on chronic systolic (congestive) heart failure: Secondary | ICD-10-CM | POA: Diagnosis not present

## 2021-11-29 DIAGNOSIS — G9341 Metabolic encephalopathy: Secondary | ICD-10-CM | POA: Diagnosis not present

## 2021-11-29 DIAGNOSIS — E1165 Type 2 diabetes mellitus with hyperglycemia: Secondary | ICD-10-CM | POA: Diagnosis not present

## 2021-11-29 DIAGNOSIS — N1832 Chronic kidney disease, stage 3b: Secondary | ICD-10-CM | POA: Diagnosis not present

## 2021-11-29 DIAGNOSIS — I10 Essential (primary) hypertension: Secondary | ICD-10-CM | POA: Diagnosis not present

## 2021-12-07 ENCOUNTER — Telehealth: Payer: Self-pay

## 2021-12-07 NOTE — Telephone Encounter (Signed)
     Patient  visit on 11/18  at [place] you been able to follow up with your primary care physician? Yes   The patient was or was not able to obtain any needed medicine or equipment. Yes   Are there diet recommendations that you are having difficulty following? Na   Patient expresses understanding of discharge instructions and education provided has no other needs at this time.  Yes     [place], [facility], Care Management  [phone] [address], [place], [place] [address] Phone: [phone] Email: [email or web address]

## 2021-12-29 ENCOUNTER — Telehealth: Payer: Self-pay

## 2021-12-29 DIAGNOSIS — I739 Peripheral vascular disease, unspecified: Secondary | ICD-10-CM | POA: Diagnosis not present

## 2021-12-29 DIAGNOSIS — I509 Heart failure, unspecified: Secondary | ICD-10-CM | POA: Diagnosis not present

## 2021-12-29 NOTE — Telephone Encounter (Signed)
I contacted patient to follow up to see if he would like to proceed with scheduling his CT and Cysto. Patient advised at this time he would like to "hold off" on scheduling anything for now. He advised he would call back once he was ready to schedule. I advised patient prior to scheduling his CT we would need to obtain a prior authorization. Patient voiced understanding.

## 2022-01-29 DIAGNOSIS — I739 Peripheral vascular disease, unspecified: Secondary | ICD-10-CM | POA: Diagnosis not present

## 2022-01-29 DIAGNOSIS — I509 Heart failure, unspecified: Secondary | ICD-10-CM | POA: Diagnosis not present

## 2022-04-04 DIAGNOSIS — I2510 Atherosclerotic heart disease of native coronary artery without angina pectoris: Secondary | ICD-10-CM | POA: Diagnosis not present

## 2022-04-04 DIAGNOSIS — Z1389 Encounter for screening for other disorder: Secondary | ICD-10-CM | POA: Diagnosis not present

## 2022-04-04 DIAGNOSIS — E1165 Type 2 diabetes mellitus with hyperglycemia: Secondary | ICD-10-CM | POA: Diagnosis not present

## 2022-04-04 DIAGNOSIS — F17210 Nicotine dependence, cigarettes, uncomplicated: Secondary | ICD-10-CM | POA: Diagnosis not present

## 2022-04-04 DIAGNOSIS — Z0001 Encounter for general adult medical examination with abnormal findings: Secondary | ICD-10-CM | POA: Diagnosis not present

## 2022-04-04 DIAGNOSIS — N1832 Chronic kidney disease, stage 3b: Secondary | ICD-10-CM | POA: Diagnosis not present

## 2022-04-04 DIAGNOSIS — I509 Heart failure, unspecified: Secondary | ICD-10-CM | POA: Diagnosis not present

## 2022-04-04 DIAGNOSIS — E782 Mixed hyperlipidemia: Secondary | ICD-10-CM | POA: Diagnosis not present

## 2022-04-04 DIAGNOSIS — E118 Type 2 diabetes mellitus with unspecified complications: Secondary | ICD-10-CM | POA: Diagnosis not present

## 2022-04-11 ENCOUNTER — Encounter: Payer: Self-pay | Admitting: Cardiovascular Disease

## 2022-04-11 NOTE — Telephone Encounter (Signed)
error 

## 2022-04-20 ENCOUNTER — Other Ambulatory Visit (HOSPITAL_COMMUNITY): Payer: Self-pay

## 2022-05-05 DIAGNOSIS — I739 Peripheral vascular disease, unspecified: Secondary | ICD-10-CM | POA: Diagnosis not present

## 2022-05-05 DIAGNOSIS — E118 Type 2 diabetes mellitus with unspecified complications: Secondary | ICD-10-CM | POA: Diagnosis not present

## 2022-06-04 DIAGNOSIS — I739 Peripheral vascular disease, unspecified: Secondary | ICD-10-CM | POA: Diagnosis not present

## 2022-06-04 DIAGNOSIS — E118 Type 2 diabetes mellitus with unspecified complications: Secondary | ICD-10-CM | POA: Diagnosis not present

## 2022-07-05 DIAGNOSIS — E118 Type 2 diabetes mellitus with unspecified complications: Secondary | ICD-10-CM | POA: Diagnosis not present

## 2022-07-05 DIAGNOSIS — I739 Peripheral vascular disease, unspecified: Secondary | ICD-10-CM | POA: Diagnosis not present

## 2022-07-26 DIAGNOSIS — E782 Mixed hyperlipidemia: Secondary | ICD-10-CM | POA: Diagnosis not present

## 2022-07-26 DIAGNOSIS — Z0001 Encounter for general adult medical examination with abnormal findings: Secondary | ICD-10-CM | POA: Diagnosis not present

## 2022-07-26 DIAGNOSIS — R262 Difficulty in walking, not elsewhere classified: Secondary | ICD-10-CM | POA: Diagnosis not present

## 2022-07-26 DIAGNOSIS — B029 Zoster without complications: Secondary | ICD-10-CM | POA: Diagnosis not present

## 2022-07-26 DIAGNOSIS — N1832 Chronic kidney disease, stage 3b: Secondary | ICD-10-CM | POA: Diagnosis not present

## 2022-07-26 DIAGNOSIS — I509 Heart failure, unspecified: Secondary | ICD-10-CM | POA: Diagnosis not present

## 2022-07-26 DIAGNOSIS — I2510 Atherosclerotic heart disease of native coronary artery without angina pectoris: Secondary | ICD-10-CM | POA: Diagnosis not present

## 2022-07-26 DIAGNOSIS — E118 Type 2 diabetes mellitus with unspecified complications: Secondary | ICD-10-CM | POA: Diagnosis not present

## 2022-08-26 DIAGNOSIS — I739 Peripheral vascular disease, unspecified: Secondary | ICD-10-CM | POA: Diagnosis not present

## 2022-08-26 DIAGNOSIS — E118 Type 2 diabetes mellitus with unspecified complications: Secondary | ICD-10-CM | POA: Diagnosis not present

## 2022-09-26 DIAGNOSIS — E118 Type 2 diabetes mellitus with unspecified complications: Secondary | ICD-10-CM | POA: Diagnosis not present

## 2022-09-26 DIAGNOSIS — N1832 Chronic kidney disease, stage 3b: Secondary | ICD-10-CM | POA: Diagnosis not present

## 2022-09-26 DIAGNOSIS — L0232 Furuncle of buttock: Secondary | ICD-10-CM | POA: Diagnosis not present

## 2022-09-26 DIAGNOSIS — Z23 Encounter for immunization: Secondary | ICD-10-CM | POA: Diagnosis not present

## 2022-10-21 ENCOUNTER — Other Ambulatory Visit (HOSPITAL_COMMUNITY): Payer: Self-pay | Admitting: *Deleted

## 2022-10-21 ENCOUNTER — Inpatient Hospital Stay (HOSPITAL_COMMUNITY)
Admission: EM | Admit: 2022-10-21 | Discharge: 2022-10-25 | DRG: 291 | Disposition: A | Discharge status: Home Health | Payer: No Typology Code available for payment source | Attending: Internal Medicine | Admitting: Internal Medicine

## 2022-10-21 ENCOUNTER — Encounter (HOSPITAL_COMMUNITY): Payer: Self-pay

## 2022-10-21 ENCOUNTER — Inpatient Hospital Stay (HOSPITAL_COMMUNITY): Payer: No Typology Code available for payment source

## 2022-10-21 ENCOUNTER — Emergency Department (HOSPITAL_COMMUNITY): Payer: No Typology Code available for payment source

## 2022-10-21 ENCOUNTER — Other Ambulatory Visit: Payer: Self-pay

## 2022-10-21 DIAGNOSIS — E66812 Obesity, class 2: Secondary | ICD-10-CM | POA: Diagnosis present

## 2022-10-21 DIAGNOSIS — G4733 Obstructive sleep apnea (adult) (pediatric): Secondary | ICD-10-CM | POA: Diagnosis present

## 2022-10-21 DIAGNOSIS — N1832 Chronic kidney disease, stage 3b: Secondary | ICD-10-CM | POA: Diagnosis present

## 2022-10-21 DIAGNOSIS — Z833 Family history of diabetes mellitus: Secondary | ICD-10-CM

## 2022-10-21 DIAGNOSIS — I2510 Atherosclerotic heart disease of native coronary artery without angina pectoris: Secondary | ICD-10-CM | POA: Diagnosis present

## 2022-10-21 DIAGNOSIS — R195 Other fecal abnormalities: Secondary | ICD-10-CM | POA: Diagnosis not present

## 2022-10-21 DIAGNOSIS — E1149 Type 2 diabetes mellitus with other diabetic neurological complication: Secondary | ICD-10-CM | POA: Diagnosis present

## 2022-10-21 DIAGNOSIS — E1151 Type 2 diabetes mellitus with diabetic peripheral angiopathy without gangrene: Secondary | ICD-10-CM | POA: Diagnosis present

## 2022-10-21 DIAGNOSIS — I509 Heart failure, unspecified: Principal | ICD-10-CM

## 2022-10-21 DIAGNOSIS — Z79899 Other long term (current) drug therapy: Secondary | ICD-10-CM

## 2022-10-21 DIAGNOSIS — E1122 Type 2 diabetes mellitus with diabetic chronic kidney disease: Secondary | ICD-10-CM | POA: Diagnosis present

## 2022-10-21 DIAGNOSIS — E785 Hyperlipidemia, unspecified: Secondary | ICD-10-CM | POA: Diagnosis present

## 2022-10-21 DIAGNOSIS — J9601 Acute respiratory failure with hypoxia: Secondary | ICD-10-CM | POA: Diagnosis present

## 2022-10-21 DIAGNOSIS — D122 Benign neoplasm of ascending colon: Secondary | ICD-10-CM | POA: Diagnosis not present

## 2022-10-21 DIAGNOSIS — F17210 Nicotine dependence, cigarettes, uncomplicated: Secondary | ICD-10-CM | POA: Diagnosis present

## 2022-10-21 DIAGNOSIS — E871 Hypo-osmolality and hyponatremia: Secondary | ICD-10-CM | POA: Diagnosis present

## 2022-10-21 DIAGNOSIS — N179 Acute kidney failure, unspecified: Secondary | ICD-10-CM | POA: Diagnosis present

## 2022-10-21 DIAGNOSIS — Z7982 Long term (current) use of aspirin: Secondary | ICD-10-CM | POA: Diagnosis not present

## 2022-10-21 DIAGNOSIS — K219 Gastro-esophageal reflux disease without esophagitis: Secondary | ICD-10-CM | POA: Diagnosis present

## 2022-10-21 DIAGNOSIS — F17200 Nicotine dependence, unspecified, uncomplicated: Secondary | ICD-10-CM | POA: Diagnosis present

## 2022-10-21 DIAGNOSIS — K648 Other hemorrhoids: Secondary | ICD-10-CM | POA: Diagnosis not present

## 2022-10-21 DIAGNOSIS — Z1152 Encounter for screening for COVID-19: Secondary | ICD-10-CM | POA: Diagnosis not present

## 2022-10-21 DIAGNOSIS — E1140 Type 2 diabetes mellitus with diabetic neuropathy, unspecified: Secondary | ICD-10-CM | POA: Diagnosis present

## 2022-10-21 DIAGNOSIS — K2990 Gastroduodenitis, unspecified, without bleeding: Secondary | ICD-10-CM | POA: Diagnosis not present

## 2022-10-21 DIAGNOSIS — I440 Atrioventricular block, first degree: Secondary | ICD-10-CM | POA: Diagnosis present

## 2022-10-21 DIAGNOSIS — J189 Pneumonia, unspecified organism: Secondary | ICD-10-CM

## 2022-10-21 DIAGNOSIS — R0902 Hypoxemia: Secondary | ICD-10-CM

## 2022-10-21 DIAGNOSIS — Z6838 Body mass index (BMI) 38.0-38.9, adult: Secondary | ICD-10-CM | POA: Diagnosis not present

## 2022-10-21 DIAGNOSIS — N183 Chronic kidney disease, stage 3 unspecified: Secondary | ICD-10-CM | POA: Diagnosis present

## 2022-10-21 DIAGNOSIS — K2980 Duodenitis without bleeding: Secondary | ICD-10-CM | POA: Diagnosis not present

## 2022-10-21 DIAGNOSIS — I255 Ischemic cardiomyopathy: Secondary | ICD-10-CM | POA: Diagnosis present

## 2022-10-21 DIAGNOSIS — K3189 Other diseases of stomach and duodenum: Secondary | ICD-10-CM | POA: Diagnosis not present

## 2022-10-21 DIAGNOSIS — I252 Old myocardial infarction: Secondary | ICD-10-CM | POA: Diagnosis not present

## 2022-10-21 DIAGNOSIS — Z91148 Patient's other noncompliance with medication regimen for other reason: Secondary | ICD-10-CM | POA: Diagnosis not present

## 2022-10-21 DIAGNOSIS — E1165 Type 2 diabetes mellitus with hyperglycemia: Secondary | ICD-10-CM | POA: Diagnosis not present

## 2022-10-21 DIAGNOSIS — F1490 Cocaine use, unspecified, uncomplicated: Secondary | ICD-10-CM | POA: Diagnosis present

## 2022-10-21 DIAGNOSIS — I5021 Acute systolic (congestive) heart failure: Secondary | ICD-10-CM | POA: Diagnosis not present

## 2022-10-21 DIAGNOSIS — K2970 Gastritis, unspecified, without bleeding: Secondary | ICD-10-CM | POA: Diagnosis not present

## 2022-10-21 DIAGNOSIS — N1831 Chronic kidney disease, stage 3a: Secondary | ICD-10-CM | POA: Diagnosis not present

## 2022-10-21 DIAGNOSIS — Z8249 Family history of ischemic heart disease and other diseases of the circulatory system: Secondary | ICD-10-CM | POA: Diagnosis not present

## 2022-10-21 DIAGNOSIS — Z1211 Encounter for screening for malignant neoplasm of colon: Secondary | ICD-10-CM | POA: Diagnosis not present

## 2022-10-21 DIAGNOSIS — I10 Essential (primary) hypertension: Secondary | ICD-10-CM | POA: Diagnosis present

## 2022-10-21 DIAGNOSIS — I5023 Acute on chronic systolic (congestive) heart failure: Secondary | ICD-10-CM | POA: Diagnosis present

## 2022-10-21 DIAGNOSIS — D123 Benign neoplasm of transverse colon: Secondary | ICD-10-CM | POA: Diagnosis not present

## 2022-10-21 DIAGNOSIS — Z7984 Long term (current) use of oral hypoglycemic drugs: Secondary | ICD-10-CM

## 2022-10-21 DIAGNOSIS — I130 Hypertensive heart and chronic kidney disease with heart failure and stage 1 through stage 4 chronic kidney disease, or unspecified chronic kidney disease: Principal | ICD-10-CM | POA: Diagnosis present

## 2022-10-21 DIAGNOSIS — D649 Anemia, unspecified: Secondary | ICD-10-CM | POA: Diagnosis not present

## 2022-10-21 DIAGNOSIS — Z955 Presence of coronary angioplasty implant and graft: Secondary | ICD-10-CM | POA: Diagnosis not present

## 2022-10-21 DIAGNOSIS — K644 Residual hemorrhoidal skin tags: Secondary | ICD-10-CM | POA: Diagnosis not present

## 2022-10-21 DIAGNOSIS — D509 Iron deficiency anemia, unspecified: Secondary | ICD-10-CM | POA: Diagnosis not present

## 2022-10-21 LAB — ECHOCARDIOGRAM COMPLETE
AR max vel: 2.77 cm2
AV Area VTI: 2.54 cm2
AV Area mean vel: 2.26 cm2
AV Mean grad: 3.4 mm[Hg]
AV Peak grad: 6.4 mm[Hg]
Ao pk vel: 1.26 m/s
Area-P 1/2: 4.89 cm2
Height: 69 in
MV M vel: 2.94 m/s
MV Peak grad: 34.5 mm[Hg]
S' Lateral: 4.8 cm
Weight: 4160 [oz_av]

## 2022-10-21 LAB — BLOOD GAS, VENOUS
Acid-Base Excess: 2.1 mmol/L — ABNORMAL HIGH (ref 0.0–2.0)
Bicarbonate: 28.7 mmol/L — ABNORMAL HIGH (ref 20.0–28.0)
Drawn by: 27160
O2 Saturation: 16.7 %
Patient temperature: 36.6
pCO2, Ven: 51 mm[Hg] (ref 44–60)
pH, Ven: 7.36 (ref 7.25–7.43)
pO2, Ven: <31 mm[Hg] — CL (ref 32–45)

## 2022-10-21 LAB — GLUCOSE, CAPILLARY
Glucose-Capillary: 181 mg/dL — ABNORMAL HIGH (ref 70–99)
Glucose-Capillary: 139 mg/dL — ABNORMAL HIGH (ref 70–99)

## 2022-10-21 LAB — BRAIN NATRIURETIC PEPTIDE: B Natriuretic Peptide: 1088 pg/mL — ABNORMAL HIGH (ref 0.0–100.0)

## 2022-10-21 LAB — CBC WITH DIFFERENTIAL/PLATELET
Abs Immature Granulocytes: 0.16 10*3/uL — ABNORMAL HIGH (ref 0.00–0.07)
Basophils Absolute: 0.1 10*3/uL (ref 0.0–0.1)
Basophils Relative: 1 %
Eosinophils Absolute: 0.8 10*3/uL — ABNORMAL HIGH (ref 0.0–0.5)
Eosinophils Relative: 6 %
HCT: 33.7 % — ABNORMAL LOW (ref 39.0–52.0)
Hemoglobin: 9.9 g/dL — ABNORMAL LOW (ref 13.0–17.0)
Immature Granulocytes: 1 %
Lymphocytes Relative: 7 %
Lymphs Abs: 1 10*3/uL (ref 0.7–4.0)
MCH: 23.1 pg — ABNORMAL LOW (ref 26.0–34.0)
MCHC: 29.4 g/dL — ABNORMAL LOW (ref 30.0–36.0)
MCV: 78.6 fL — ABNORMAL LOW (ref 80.0–100.0)
Monocytes Absolute: 0.8 10*3/uL (ref 0.1–1.0)
Monocytes Relative: 6 %
Neutro Abs: 11 10*3/uL — ABNORMAL HIGH (ref 1.7–7.7)
Neutrophils Relative %: 79 %
Platelets: 277 10*3/uL (ref 150–400)
RBC: 4.29 MIL/uL (ref 4.22–5.81)
RDW: 21.8 % — ABNORMAL HIGH (ref 11.5–15.5)
WBC: 13.8 10*3/uL — ABNORMAL HIGH (ref 4.0–10.5)
nRBC: 0.8 % — ABNORMAL HIGH (ref 0.0–0.2)

## 2022-10-21 LAB — HEMOGLOBIN A1C
Hgb A1c MFr Bld: 6.3 % — ABNORMAL HIGH (ref 4.8–5.6)
Mean Plasma Glucose: 134.11 mg/dL

## 2022-10-21 LAB — BASIC METABOLIC PANEL
Anion gap: 8 (ref 5–15)
BUN: 32 mg/dL — ABNORMAL HIGH (ref 8–23)
CO2: 24 mmol/L (ref 22–32)
Calcium: 8.1 mg/dL — ABNORMAL LOW (ref 8.9–10.3)
Chloride: 102 mmol/L (ref 98–111)
Creatinine, Ser: 1.79 mg/dL — ABNORMAL HIGH (ref 0.61–1.24)
GFR, Estimated: 41 mL/min — ABNORMAL LOW (ref >60)
Glucose, Bld: 159 mg/dL — ABNORMAL HIGH (ref 70–99)
Potassium: 3.7 mmol/L (ref 3.5–5.1)
Sodium: 134 mmol/L — ABNORMAL LOW (ref 135–145)

## 2022-10-21 LAB — PROCALCITONIN: Procalcitonin: 0.15 ng/mL

## 2022-10-21 LAB — TROPONIN I (HIGH SENSITIVITY)
Troponin I (High Sensitivity): 10 ng/L (ref <18)
Troponin I (High Sensitivity): 10 ng/L

## 2022-10-21 LAB — TSH: TSH: 3.564 u[IU]/mL (ref 0.350–4.500)

## 2022-10-21 MED ORDER — SODIUM CHLORIDE 0.9% FLUSH
3.0000 mL | Freq: Two times a day (BID) | INTRAVENOUS | Status: DC
  Administered 2022-10-21 – 2022-10-25 (×7): 3 mL via INTRAVENOUS

## 2022-10-21 MED ORDER — FUROSEMIDE 10 MG/ML IJ SOLN
40.0000 mg | Freq: Two times a day (BID) | INTRAMUSCULAR | Status: DC
  Administered 2022-10-21 – 2022-10-23 (×4): 40 mg via INTRAVENOUS
  Filled 2022-10-21 (×4): qty 4

## 2022-10-21 MED ORDER — INSULIN ASPART 100 UNIT/ML IJ SOLN
0.0000 [IU] | Freq: Three times a day (TID) | INTRAMUSCULAR | Status: DC
  Administered 2022-10-22 – 2022-10-24 (×5): 2 [IU] via SUBCUTANEOUS
  Administered 2022-10-24: 3 [IU] via SUBCUTANEOUS
  Administered 2022-10-24: 1 [IU] via SUBCUTANEOUS
  Administered 2022-10-25: 2 [IU] via SUBCUTANEOUS
  Administered 2022-10-25: 1 [IU] via SUBCUTANEOUS

## 2022-10-21 MED ORDER — ATORVASTATIN CALCIUM 40 MG PO TABS
40.0000 mg | ORAL_TABLET | Freq: Every day | ORAL | Status: DC
  Administered 2022-10-21 – 2022-10-24 (×4): 40 mg via ORAL
  Filled 2022-10-21 (×4): qty 1

## 2022-10-21 MED ORDER — NICOTINE 21 MG/24HR TD PT24
21.0000 mg | MEDICATED_PATCH | Freq: Every day | TRANSDERMAL | Status: DC
  Administered 2022-10-21 – 2022-10-25 (×5): 21 mg via TRANSDERMAL
  Filled 2022-10-21 (×5): qty 1

## 2022-10-21 MED ORDER — IPRATROPIUM-ALBUTEROL 0.5-2.5 (3) MG/3ML IN SOLN
3.0000 mL | Freq: Once | RESPIRATORY_TRACT | Status: AC
  Administered 2022-10-21: 3 mL via RESPIRATORY_TRACT
  Filled 2022-10-21: qty 3

## 2022-10-21 MED ORDER — IPRATROPIUM-ALBUTEROL 0.5-2.5 (3) MG/3ML IN SOLN: 3.0000 mL | Freq: Four times a day (QID) | RESPIRATORY_TRACT | Status: DC | PRN

## 2022-10-21 MED ORDER — ONDANSETRON HCL 4 MG PO TABS: 4.0000 mg | ORAL_TABLET | Freq: Four times a day (QID) | ORAL | Status: DC | PRN

## 2022-10-21 MED ORDER — OXYCODONE HCL 5 MG PO TABS
5.0000 mg | ORAL_TABLET | Freq: Four times a day (QID) | ORAL | Status: DC | PRN
  Administered 2022-10-24: 5 mg via ORAL
  Filled 2022-10-21: qty 1

## 2022-10-21 MED ORDER — AZITHROMYCIN 250 MG PO TABS
250.0000 mg | ORAL_TABLET | Freq: Every day | ORAL | Status: DC
  Administered 2022-10-22: 250 mg via ORAL
  Filled 2022-10-21: qty 1

## 2022-10-21 MED ORDER — PANTOPRAZOLE SODIUM 40 MG PO TBEC
40.0000 mg | DELAYED_RELEASE_TABLET | Freq: Every day | ORAL | Status: DC
  Administered 2022-10-21 – 2022-10-25 (×5): 40 mg via ORAL
  Filled 2022-10-21 (×5): qty 1

## 2022-10-21 MED ORDER — ACETAMINOPHEN 650 MG RE SUPP: 650.0000 mg | Freq: Four times a day (QID) | RECTAL | Status: DC | PRN

## 2022-10-21 MED ORDER — CARVEDILOL 3.125 MG PO TABS
3.1250 mg | ORAL_TABLET | Freq: Two times a day (BID) | ORAL | Status: DC
  Administered 2022-10-21 – 2022-10-25 (×8): 3.125 mg via ORAL
  Filled 2022-10-21 (×8): qty 1

## 2022-10-21 MED ORDER — ACETAMINOPHEN 325 MG PO TABS
650.0000 mg | ORAL_TABLET | Freq: Four times a day (QID) | ORAL | Status: DC | PRN
  Administered 2022-10-24: 650 mg via ORAL
  Filled 2022-10-21: qty 2

## 2022-10-21 MED ORDER — SODIUM CHLORIDE 0.9% FLUSH: 3.0000 mL | INTRAVENOUS | Status: DC | PRN

## 2022-10-21 MED ORDER — FUROSEMIDE 10 MG/ML IJ SOLN
40.0000 mg | Freq: Once | INTRAMUSCULAR | Status: AC
  Administered 2022-10-21: 40 mg via INTRAVENOUS
  Filled 2022-10-21: qty 4

## 2022-10-21 MED ORDER — SODIUM CHLORIDE 0.9 % IV SOLN
500.0000 mg | Freq: Once | INTRAVENOUS | Status: AC
  Administered 2022-10-21: 500 mg via INTRAVENOUS
  Filled 2022-10-21: qty 5

## 2022-10-21 MED ORDER — GABAPENTIN 300 MG PO CAPS
300.0000 mg | ORAL_CAPSULE | Freq: Three times a day (TID) | ORAL | Status: DC
  Administered 2022-10-21 – 2022-10-25 (×12): 300 mg via ORAL
  Filled 2022-10-21: qty 3
  Filled 2022-10-21 (×11): qty 1

## 2022-10-21 MED ORDER — SODIUM CHLORIDE 0.9 % IV SOLN
1.0000 g | Freq: Once | INTRAVENOUS | Status: AC
  Administered 2022-10-21: 1 g via INTRAVENOUS
  Filled 2022-10-21: qty 10

## 2022-10-21 MED ORDER — ONDANSETRON HCL 4 MG/2ML IJ SOLN: 4.0000 mg | Freq: Four times a day (QID) | INTRAMUSCULAR | Status: DC | PRN

## 2022-10-21 MED ORDER — SODIUM CHLORIDE 0.9 % IV SOLN
2.0000 g | INTRAVENOUS | Status: DC
  Administered 2022-10-22: 2 g via INTRAVENOUS
  Filled 2022-10-21: qty 20

## 2022-10-21 MED ORDER — SODIUM CHLORIDE 0.9 % IV SOLN: 250.0000 mL | INTRAVENOUS | Status: AC | PRN

## 2022-10-21 MED ORDER — DM-GUAIFENESIN ER 30-600 MG PO TB12
1.0000 | ORAL_TABLET | Freq: Two times a day (BID) | ORAL | Status: DC
  Administered 2022-10-21 – 2022-10-22 (×3): 1 via ORAL
  Filled 2022-10-21 (×3): qty 1

## 2022-10-21 MED ORDER — ENOXAPARIN SODIUM 40 MG/0.4ML IJ SOSY: 40.0000 mg | PREFILLED_SYRINGE | INTRAMUSCULAR | Status: DC

## 2022-10-21 MED ORDER — INSULIN ASPART 100 UNIT/ML IJ SOLN
0.0000 [IU] | Freq: Every day | INTRAMUSCULAR | Status: DC
  Administered 2022-10-22 – 2022-10-23 (×2): 2 [IU] via SUBCUTANEOUS

## 2022-10-21 MED ORDER — ENOXAPARIN SODIUM 60 MG/0.6ML IJ SOSY
60.0000 mg | PREFILLED_SYRINGE | INTRAMUSCULAR | Status: DC
  Administered 2022-10-21: 60 mg via SUBCUTANEOUS
  Filled 2022-10-21: qty 0.6

## 2022-10-21 NOTE — TOC Initial Note (Addendum)
Transition of Care [facility]) - Initial/Assessment Note    Patient Details  Name: [name] MRN: [id number] Date of Birth: 1955/05/21  Transition of Care [facility] CM/SW Contact:    Villa Herb, LCSWA Phone Number: 10/21/2022, 1:06 PM  Clinical Narrative:                 Pt is high risk for readmission. TOC also consulted for CHF screen. CSW spoke with pt to complete assessment. Pt states he lives alone and is mostly independent in completing his ADLs. Pt states he would like to get assistance through [facility] in [place] again, CSW explained he will need to contact the company and his PCP as this cannot be set up by TOC. CSW explained that we can set up [facility], pt has used [facility] in the past. Pt states he rides the bus when needed. Pt states that he has a rollator to use in the home. Pt states he tries to follow a heart healthy diet. He takes medications when he remembers. Pt does not weigh daily. [facility], notification ID is [id number]. TOC to follow.   Expected Discharge Plan: Home w Home Health Services Barriers to Discharge: Continued Medical Work up   Patient Goals and CMS Choice Patient states their goals for this hospitalization and ongoing recovery are:: return home CMS Medicare.gov Compare Post Acute Care list provided to:: Patient Choice offered to / list presented to : Patient      Expected Discharge Plan and Services In-house Referral: Clinical Social Work Discharge Planning Services: CM Consult Post Acute Care Choice: Home Health Living arrangements for the past 2 months: Single Family Home                                      Prior Living Arrangements/Services Living arrangements for the past 2 months: Single Family Home Lives with:: Self Patient language and need for interpreter reviewed:: Yes Do you feel safe going back to the place where you live?: Yes      Need for Family Participation in Patient Care: Yes  (Comment) Care giver support system in place?: Yes (comment) Current home services: DME (rollator) Criminal Activity/Legal Involvement Pertinent to Current Situation/Hospitalization: No - Comment as needed  Activities of Daily Living      Permission Sought/Granted                  Emotional Assessment Appearance:: Appears stated age Attitude/Demeanor/Rapport: Engaged Affect (typically observed): Accepting Orientation: : Oriented to Self, Oriented to Place, Oriented to  Time, Oriented to Situation Alcohol / Substance Use: Not Applicable Psych Involvement: No (comment)  Admission diagnosis:  Acute respiratory failure with hypoxia (HCC) [J96.01] Patient Active Problem List   Diagnosis Date Noted   Pleural effusion 11/10/2021   Abnormal CT of the chest 11/09/2021   Mediastinal lymphadenopathy 11/09/2021   Acute respiratory failure with hypoxia (HCC) 11/07/2021   Stroke (cerebrum) (HCC) 11/07/2021   Elevated troponin 11/07/2021   Acute metabolic encephalopathy 11/07/2021   Protein calorie malnutrition (HCC) 11/07/2021   Cardiac volume overload 11/07/2021   Acute systolic heart failure (HCC)    Scrotal pain; chronic, bilateral 07/05/2021   Microscopic hematuria 07/05/2021   Organic impotence 07/05/2021   Necrotizing fasciitis (HCC) 05/22/2019   Opiate overdose (HCC)    Uncontrolled type 2 diabetes mellitus with hyperglycemia (HCC) 05/16/2019   Elevated lactic acid level  Acute renal failure superimposed on stage 3b chronic kidney disease (HCC) 05/15/2019   Cocaine abuse (HCC) 05/15/2019   Severe sepsis (HCC) 05/14/2019   Unstable angina (HCC) 10/16/2018   Apical mural thrombus 10/15/2018   Claudication in peripheral vascular disease (HCC) 11/30/2017   Peripheral arterial disease (HCC) 11/28/2017   Chest pain 11/03/2016   Constipation 11/03/2016   CKD (chronic kidney disease), stage II 08/02/2016   Cellulitis of right hand 08/02/2016   Chronic pain 08/02/2016   AKI  (acute kidney injury) (HCC) 01/31/2016   Lobar pneumonia (HCC) 01/31/2016   Influenza A 01/31/2016   Diarrhea 01/30/2016   NSVT (nonsustained ventricular tachycardia) (HCC) 09/12/2015   Precordial chest pain 09/10/2015   Cocaine use 09/04/2015   Near syncope 06/29/2015   Essential hypertension 06/29/2015   Status post coronary artery stent placement 06/29/2015   Insulin dependent diabetes mellitus 06/29/2015   History of MI (myocardial infarction) 06/29/2015   Acute on chronic systolic CHF (congestive heart failure) (HCC) 06/29/2015   Musculoskeletal chest pain 05/27/2015   OSA on CPAP 01/21/2015   Rectal bleeding    Stage 3 chronic renal impairment associated with type 2 diabetes mellitus (HCC) 09/09/2013   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 09/09/2013   Coronary artery disease 09/09/2013   Cardiomyopathy, ischemic-EF 40-45% by echo 09/07/13 09/09/2013   S/P LAD DES June 2014 09/06/2013   Tobacco use disorder 09/06/2013   Hyperglycemia 08/22/2012   NSTEMI (non-ST elevated myocardial infarction) (HCC) 07/07/2012   Dyslipidemia 07/07/2012   Hypertensive heart disease    PCP:  Benetta Spar, MD Pharmacy:   [facility] [place], [place] - [address] [place] [address] Phone: [phone] Fax: [phone]  [facility] [place], [place] - [address] [place] [address] Phone: [phone] Fax: [phone]     Social Determinants of Health (SDOH) Social History: SDOH Screenings   Food Insecurity: No Food Insecurity (11/09/2021)  Housing: Low Risk  (11/09/2021)  Transportation Needs: No Transportation Needs (11/09/2021)  Utilities: Not At Risk (11/09/2021)  Tobacco Use: High Risk (10/21/2022)   SDOH Interventions:     Readmission Risk Interventions    10/21/2022    1:05 PM  Readmission Risk Prevention Plan  Transportation Screening Complete  HRI or Home Care Consult Complete  Social Work  Consult for Recovery Care Planning/Counseling Complete  Palliative Care Screening Not Applicable  Medication Review Oceanographer) Complete

## 2022-10-21 NOTE — ED Triage Notes (Addendum)
Pt c/o feet and legs swelling and painful that has been going on for a couple weeks. Pt states the right foot and leg hurts worse and that it's hard to bare weight on the right foot/leg. Pt states he has had some SOB. O2 was 85% placed on 2L of oxygen.

## 2022-10-21 NOTE — H&P (Signed)
History and Physical    Patient: [name] MWU:[id number] DOB: 05/25/1955 DOA: 10/21/2022 DOS: the patient was seen and examined on 10/21/2022 PCP: Benetta Spar, MD  Patient coming from: Home  Chief Complaint:  Chief Complaint  Patient presents with   Leg Swelling   Foot Swelling   HPI: [name] is a 67 y.o. male with medical history significant of coronary artery disease, tobacco abuse, hypertension, hyperlipidemia, gastroesophageal flux disease, type 2 diabetes with nephropathy, history of cocaine abuse, obstructive sleep apnea, medication noncompliance and systolic heart failure; who presented to the hospital secondary to shortness of breath, intermittent coughing spells (productive; without hemoptysis), increased lower extremity swelling, orthopnea and subjective fever.  Patient reports lower extremity swelling and orthopnea has been present and progressing for over 2 weeks and in the last 3 days prior to admission has noticed subjective fever productive coughing spells and worsening shortness of breath.    Patient denies chest pain, nausea, vomiting, dysuria, hematuria, melena, hematochezia, sick contacts, focal weaknesses or any other complaints.  Patient expressed no recreational drug use for many month and expressed that he is trying to quit tobacco use.   After discussing with patient he expressed noncompliance with medications, outpatient appointments and recognized diet indiscretion.  Workup in the ED demonstrating concerns for community-acquired pneumonia and acute on chronic CHF.  Chest x-ray with vascular congestion and left lower lobe lobe infiltrate; elevated BNP, elevated WBC's new requirement of oxygen supplementation to maintain saturation above 90%.  In the ED cultures taken, antibiotics initiated and IV Lasix provided.  TRH has been consulted to place patient in the hospital for further evaluation and management.   Review of Systems: As  mentioned in the history of present illness. All other systems reviewed and are negative. Past Medical History:  Diagnosis Date   Bulging lumbar disc    CAD (coronary artery disease) [id number]   a. prior LAD stenting. b. s/p DES to [facility] 08/2015. c. 04/2016 Cardiac cath at [facility]. Patent stent in the PLAD and RCA. Diffuse dLAD, OM2, and  RPDA disease. d. DES to PDA and distal RCA 03/2019    Chronic lower back pain    CKD (chronic kidney disease), stage II    Cocaine abuse (HCC)    Cyst of epididymis    DM2 (diabetes mellitus, type 2) (HCC)    Essential hypertension    GERD (gastroesophageal reflux disease)    Headache    History of pneumonia    Hyperlipidemia    Ischemic cardiomyopathy    LV (left ventricular) mural thrombus    Sleep apnea    Past Surgical History:  Procedure Laterality Date   APPENDECTOMY     BRONCHIAL NEEDLE ASPIRATION BIOPSY  11/12/2021   Procedure: BRONCHIAL NEEDLE ASPIRATION BIOPSIES;  Surgeon: Omar Person, MD;  Location: [facility] ENDOSCOPY;  Service: Pulmonary;;   CARDIAC CATHETERIZATION N/A 09/07/2015   Procedure: Left Heart Cath and Coronary Angiography;  Surgeon: Marykay Lex, MD;  Location: [facility] INVASIVE CV LAB;  Service: Cardiovascular;  Laterality: N/A;   CARDIAC CATHETERIZATION N/A 09/07/2015   Procedure: Coronary Stent Intervention;  Surgeon: Marykay Lex, MD;  Location: [facility] INVASIVE CV LAB;  Service: Cardiovascular;  Laterality: N/A;   CORONARY ANGIOGRAM  09/07/13   residual RCA and OM disease   CORONARY ANGIOPLASTY WITH STENT PLACEMENT     CORONARY STENT INTERVENTION N/A 10/16/2018   Procedure: CORONARY STENT INTERVENTION;  Surgeon: Kathleene Hazel, MD;  Location: [facility] INVASIVE CV  LAB;  Service: Cardiovascular;  Laterality: N/A;   CORONARY STENT INTERVENTION N/A 03/11/2019   Procedure: CORONARY STENT INTERVENTION;  Surgeon: Corky Crafts, MD;  Location: [facility] INVASIVE CV LAB;  Service: Cardiovascular;  Laterality: N/A;   FRACTURE  SURGERY     INCISION AND DRAINAGE OF WOUND Left 05/19/2019   Procedure: DEBRIDEMENT LEFT GROIN;  Surgeon: Violeta Gelinas, MD;  Location: [facility] OR;  Service: General;  Laterality: Left;   INSERTION OF ILIAC STENT Left 11/30/2017   Left external illiac stent   INSERTION OF ILIAC STENT  11/30/2017   Procedure: Insertion Of Iliac Stent;  Surgeon: Runell Gess, MD;  Location: [facility] INVASIVE CV LAB;  Service: Cardiovascular;;  Left external illiac stent   KNEE ARTHROSCOPY Left    KNEE SURGERY     "ligaments, cartilage; tendon, put a pin in" (11/30/2017)   LEFT HEART CATH Bilateral 07/08/2012   Procedure: LEFT HEART CATH;  Surgeon: Corky Crafts, MD;  Location: [facility] CATH LAB;  Service: Cardiovascular;  Laterality: Bilateral;   LEFT HEART CATH AND CORONARY ANGIOGRAPHY N/A 10/16/2018   Procedure: LEFT HEART CATH AND CORONARY ANGIOGRAPHY;  Surgeon: Kathleene Hazel, MD;  Location: [facility] INVASIVE CV LAB;  Service: Cardiovascular;  Laterality: N/A;   LEFT HEART CATH AND CORONARY ANGIOGRAPHY N/A 03/11/2019   Procedure: LEFT HEART CATH AND CORONARY ANGIOGRAPHY;  Surgeon: Corky Crafts, MD;  Location: [facility] INVASIVE CV LAB;  Service: Cardiovascular;  Laterality: N/A;   LEFT HEART CATHETERIZATION WITH CORONARY ANGIOGRAM N/A 09/06/2013   STEMI, 2nd ISR LAD. Procedure: LEFT HEART CATHETERIZATION WITH CORONARY ANGIOGRAM;  Surgeon: Corky Crafts, MD;  Location: [facility] CATH LAB;  Service: Cardiovascular;  Laterality: N/A;   LOWER EXTREMITY ANGIOGRAPHY N/A 11/30/2017   Procedure: LOWER EXTREMITY ANGIOGRAPHY;  Surgeon: Runell Gess, MD;  Location: [facility] INVASIVE CV LAB;  Service: Cardiovascular;  Laterality: N/A;   PERCUTANEOUS CORONARY STENT INTERVENTION (PCI-S)  07/08/2012   Procedure: PERCUTANEOUS CORONARY STENT INTERVENTION (PCI-S);  Surgeon: Corky Crafts, MD;  Location: [facility] CATH LAB;  Service: Cardiovascular;;  DES LAD   PERCUTANEOUS CORONARY STENT INTERVENTION (PCI-S) N/A 09/06/2013   Procedure:  PERCUTANEOUS CORONARY STENT INTERVENTION (PCI-S);  Surgeon: Corky Crafts, MD;  Location: [facility] CATH LAB;  Service: Cardiovascular;  Laterality: N/A;  Mid LAD 3.0/24mm Promus   THORACENTESIS Right 11/10/2021   Procedure: THORACENTESIS;  Surgeon: Leslye Peer, MD;  Location: [facility] ENDOSCOPY;  Service: Cardiopulmonary;  Laterality: Right;   VIDEO BRONCHOSCOPY WITH ENDOBRONCHIAL ULTRASOUND Right 11/12/2021   Procedure: VIDEO BRONCHOSCOPY WITH ENDOBRONCHIAL ULTRASOUND;  Surgeon: Omar Person, MD;  Location: [facility] ENDOSCOPY;  Service: Pulmonary;  Laterality: Right;   WOUND DEBRIDEMENT Left 05/20/2019   Procedure: DEBRIDEMENT GROIN;  Surgeon: Kinsinger, De Blanch, MD;  Location: [facility] OR;  Service: General;  Laterality: Left;   WRIST FRACTURE SURGERY Bilateral    Social History:  reports that he has been smoking cigarettes. He has a 24 pack-year smoking history. He has never used smokeless tobacco. He reports that he does not currently use alcohol. He reports that he does not use drugs.  Allergies  Allergen Reactions   Clopidogrel Other (See Comments)    Drowsy, Skin irritation    Family History  Problem Relation Age of Onset   Hypertension Mother    Diabetes Mother     Prior to Admission medications   Medication Sig Start Date End Date Taking? Authorizing Provider  acyclovir (ZOVIRAX) 800 MG tablet Take 800 mg by mouth 3 (three) times daily.  Yes [provider]  ASPIRIN LOW DOSE 81 MG tablet Take 81 mg by mouth daily. 10/14/22  Yes [provider]  atorvastatin (LIPITOR) 40 MG tablet Take 1 tablet (40 mg total) by mouth at bedtime. 11/13/21 10/21/22 Yes Arrien, [name], MD  carvedilol (COREG) 3.125 MG tablet Take 1 tablet (3.125 mg total) by mouth 2 (two) times daily with a meal. 11/13/21 10/21/22 Yes Arrien, [name], MD  furosemide (LASIX) 40 MG tablet Take 1 tablet (40 mg total) by mouth daily. 11/13/21 10/21/22 Yes Arrien, [name], MD  gabapentin  (NEURONTIN) 300 MG capsule Take 300 mg by mouth 3 (three) times daily. 09/26/22  Yes [provider]  JANUMET 50-500 MG tablet Take 1 tablet by mouth 2 (two) times daily. 11/13/21 10/21/22 Yes Arrien, [name], MD  sertraline (ZOLOFT) 50 MG tablet Take 50 mg by mouth at bedtime. 08/25/22  Yes [provider]  sildenafil (VIAGRA) 50 MG tablet Take 50 mg by mouth daily as needed. 08/26/22  Yes [provider]    Physical Exam: Vitals:   10/21/22 0915 10/21/22 1021 10/21/22 1215 10/21/22 1322  BP: (!) 109/93  115/79 104/78  Pulse: 76  74 79  Resp: (!) 24  18 19   Temp:    97.7 F (36.5 C)  TempSrc:    Oral  SpO2: 97% 96% 99% 90%  Weight:      Height:       General exam: Alert, awake, oriented x 3; 3 L nasal cannula is in place to maintain saturations; short winded with exertion and having mild difficulty speaking in full sentences due to shortness of breath.  No chest pain. Respiratory system: Fine crackles at the bases; positive tachypnea with activity; no wheezing.  Positive scattered rhonchi appreciated. Cardiovascular system:RRR.  Rubs or gallops; positive JVD. Gastrointestinal system: Abdomen is nondistended, soft and nontender. No organomegaly or masses felt. Normal bowel sounds heard. Central nervous system: Moving 4 limbs spontaneously.  No focal neurological deficits. Extremities: No cyanosis or clubbing; 2+ edema bilaterally. Skin: No petechiae. Psychiatry: Judgement and insight appear normal. Mood & affect appropriate.   Data Reviewed: CBC: White blood cell 13.8, hemoglobin 9.9 and platelet count 277K; absolute neutrophils 11.0 BNP: 1,088 Basic metabolic panel: Sodium 134, potassium 3.7, chloride 102, bicarb 24, glucose 159 BUN 32, creatinine 1.79 and GFR 41 Troponin: 10 >>10  Assessment and Plan: Tobacco use disorder -I have discussed tobacco cessation with the patient.  I have counseled the patient regarding the negative impacts of continued  tobacco use including but not limited to lung cancer, COPD, and cardiovascular disease.  I have discussed alternatives to tobacco and modalities that may help facilitate tobacco cessation including but not limited to biofeedback, hypnosis, and medications.  Total time spent with tobacco counseling was 4 minutes. -Nicotine patch has been ordered.   Essential hypertension -Continue the use of carvedilol and currently IV diuretics -Heart healthy/low-sodium diet discussed with patient -Maintain adequate hydration -Follow vital signs.  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  Stage 3 chronic renal impairment associated with type 2 diabetes mellitus (HCC) -Goal at the moment -Minimize nephrotoxic agents, avoid hypotension and the use of contrast -Continue close monitoring of patient's renal function/electrolytes especially while actively diuresing him. -Follow clinical response and encourage adequate oral hide  OSA on CPAP -Continue CPAP nightly. -Discussed with patient.  Cocaine use -Patient reports being clean for  multiple months -UDS ordered at time of admission.  Acute on chronic systolic CHF (congestive heart failure) (HCC) -Prior ejection fraction around 30% -Patient educated about importance of low-sodium diet, daily weights and adequate hydration -Instructed to be medically compliant with treatment and outpatient follow-up with cardiology service (patient expressed no following that). -IV diuresis and the use of beta-blocker has been restarted -Update 2D echo -Follow strict intake and output -Depending on clinical response and echo results might benefit of cardiology service inputs for further GDMT adjustments.  Coronary artery disease -No chest pain -Continue beta-blocker, statin and baby aspirin -Continue telemetry monitoring -Update 2D  echo. -Patient encouraged to stop smoking and to be compliant with outpatient follow-up with cardiology service.  Acute respiratory failure with hypoxia (HCC) -With concerns for community-acquired pneumonia along with CHF exacerbation -On presentation vascular congestion and left lower lobe opacities/infiltrates appreciated. -Patient with elevated WBCs, complaining of subjective fever and intermittent coughing spells prior to admission -Also found with elevated BNP, increased lower extremity swelling and complaints of orthopnea -Will check procalcitonin; cover with antibiotics and provide bronchodilators/mucolytic's agents. -IV diuresis and further treatment for CHF has been initiated (please refer to acute on chronic systolic heart failure problem for further details on management). -Wean off oxygen supplementation.   GERD -continue PPI  Class 2 obesity -Low-calorie diet, portion control and increase physical activity discussed with patient.   Advance Care Planning:   Code Status: Full Code   Consults: None  Family Communication: No family at bedside.  Severity of Illness: The appropriate patient status for this patient is INPATIENT. Inpatient status is judged to be reasonable and necessary in order to provide the required intensity of service to ensure the patient's safety. The patient's presenting symptoms, physical exam findings, and initial radiographic and laboratory data in the context of their chronic comorbidities is felt to place them at high risk for further clinical deterioration. Furthermore, it is not anticipated that the patient will be medically stable for discharge from the hospital within 2 midnights of admission.   * I certify that at the point of admission it is my clinical judgment that the patient will require inpatient hospital care spanning beyond 2 midnights from the point of admission due to high intensity of service, high risk for further deterioration and high  frequency of surveillance required.*  Author: Vassie Loll, MD 10/21/2022 4:58 PM  For on call review [email or web address].

## 2022-10-21 NOTE — Assessment & Plan Note (Signed)
-  Goal at the moment -Minimize nephrotoxic agents, avoid hypotension and the use of contrast -Continue close monitoring of patient's renal function/electrolytes especially while actively diuresing him. -Follow clinical response and encourage adequate oral hide

## 2022-10-21 NOTE — Assessment & Plan Note (Signed)
-  Patient reports being clean for multiple months -UDS ordered at time of admission.

## 2022-10-21 NOTE — Assessment & Plan Note (Signed)
-  Update A1c -Holding oral hypoglycemic agents -Sliding scale insulin has been started -Modified carbohydrate diet recommended.

## 2022-10-21 NOTE — ED Provider Notes (Signed)
[place] EMERGENCY DEPARTMENT AT [facility] Provider Note  CSN: [id number] Arrival date & time: 10/21/22 0845  Chief Complaint(s) Leg Swelling and Foot Swelling  HPI [name] is a 67 y.o. male with past medical history as below, significant for CAD, CKD, cocaine abuse, GERD, hypertension, hyperlipidemia sleep apnea, ischemic cardiomyopathy who presents to the ED with complaint of dyspnea  He reports worsening difficulty breathing over the past week.  Difficulty getting around the house.  He does not walk much but is able to perform some of his ADLs at home.  He rides around on a lawn mower if he has to leave his home.  Breathing worsened more so in the past 24 hours, he fell out of bed this morning and unable to get back in bed by himself. Does not believe he hit his head.  No blood thinners.  Coughing intermittently, occ productive. Some chills, body aches.   Some weight gain over the past 2 weeks but unsure how much.  Exertional dyspnea, denies orthopnea.  Denies chest pain, no fevers or vomiting  Past Medical History Past Medical History:  Diagnosis Date   Bulging lumbar disc    CAD (coronary artery disease) [phone]   a. prior LAD stenting. b. s/p DES to [facility] 08/2015. c. 04/2016 Cardiac cath at [facility]. Patent stent in the PLAD and RCA. Diffuse dLAD, OM2, and  RPDA disease. d. DES to PDA and distal RCA 03/2019    Chronic lower back pain    CKD (chronic kidney disease), stage II    Cocaine abuse (HCC)    Cyst of epididymis    DM2 (diabetes mellitus, type 2) (HCC)    Essential hypertension    GERD (gastroesophageal reflux disease)    Headache    History of pneumonia    Hyperlipidemia    Ischemic cardiomyopathy    LV (left ventricular) mural thrombus    Sleep apnea    Patient Active Problem List   Diagnosis Date Noted   Pleural effusion 11/10/2021   Abnormal CT of the chest 11/09/2021   Mediastinal lymphadenopathy 11/09/2021   Acute respiratory  failure with hypoxia (HCC) 11/07/2021   Stroke (cerebrum) (HCC) 11/07/2021   Elevated troponin 11/07/2021   Acute metabolic encephalopathy 11/07/2021   Protein calorie malnutrition (HCC) 11/07/2021   Cardiac volume overload 11/07/2021   Acute systolic heart failure (HCC)    Scrotal pain; chronic, bilateral 07/05/2021   Microscopic hematuria 07/05/2021   Organic impotence 07/05/2021   Necrotizing fasciitis (HCC) 05/22/2019   Opiate overdose (HCC)    Uncontrolled type 2 diabetes mellitus with hyperglycemia (HCC) 05/16/2019   Elevated lactic acid level    Acute renal failure superimposed on stage 3b chronic kidney disease (HCC) 05/15/2019   Cocaine abuse (HCC) 05/15/2019   Severe sepsis (HCC) 05/14/2019   Unstable angina (HCC) 10/16/2018   Apical mural thrombus 10/15/2018   Claudication in peripheral vascular disease (HCC) 11/30/2017   Peripheral arterial disease (HCC) 11/28/2017   Chest pain 11/03/2016   Constipation 11/03/2016   CKD (chronic kidney disease), stage II 08/02/2016   Cellulitis of right hand 08/02/2016   Chronic pain 08/02/2016   AKI (acute kidney injury) (HCC) 01/31/2016   Lobar pneumonia (HCC) 01/31/2016   Influenza A 01/31/2016   Diarrhea 01/30/2016   NSVT (nonsustained ventricular tachycardia) (HCC) 09/12/2015   Precordial chest pain 09/10/2015   Cocaine use 09/04/2015   Near syncope 06/29/2015   Essential hypertension 06/29/2015   Status post coronary artery stent  placement 06/29/2015   Insulin dependent diabetes mellitus 06/29/2015   History of MI (myocardial infarction) 06/29/2015   Acute on chronic systolic CHF (congestive heart failure) (HCC) 06/29/2015   Musculoskeletal chest pain 05/27/2015   OSA on CPAP 01/21/2015   Rectal bleeding    Stage 3 chronic renal impairment associated with type 2 diabetes mellitus (HCC) 09/09/2013   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 Authorizing Provider  atorvastatin (LIPITOR) 40 MG tablet Take 1 tablet (40 mg total) by mouth at bedtime. 11/13/21 12/13/21  Arrien, [name], MD  carvedilol (COREG) 3.125 MG tablet Take 1 tablet (3.125 mg total) by mouth 2 (two) times daily with a meal. 11/13/21 12/13/21  Arrien, [name], MD  furosemide (LASIX) 40 MG tablet Take 1 tablet (40 mg total) by mouth daily. 11/13/21 12/13/21  Arrien, [name], MD  gabapentin (NEURONTIN) 600 MG tablet Take 0.5 tablets (300 mg total) by mouth 3 (three) times daily. 11/20/21 12/20/21  Kommor, Madison, MD  JANUMET 50-500 MG tablet Take 1 tablet by mouth 2 (two) times daily. 11/13/21 12/13/21  Arrien, [name], MD  oxyCODONE-acetaminophen (PERCOCET/ROXICET) 5-325 MG tablet Take 1 tablet by mouth every 6 (six) hours as needed for severe pain. 11/20/21   Kommor, Madison, MD  traMADol (ULTRAM) 50 MG tablet Take 1 tablet (50 mg total) by mouth every 6 (six) hours as needed for severe pain. 11/20/21   Glendora Score, MD                                                                                                                                    Past Surgical History Past Surgical History:  Procedure Laterality Date   APPENDECTOMY     BRONCHIAL NEEDLE ASPIRATION BIOPSY  11/12/2021   Procedure: BRONCHIAL NEEDLE ASPIRATION BIOPSIES;  Surgeon: Omar Person, MD;  Location: [facility] ENDOSCOPY;  Service: Pulmonary;;   CARDIAC CATHETERIZATION N/A 09/07/2015   Procedure: Left Heart Cath and Coronary Angiography;  Surgeon: Marykay Lex, MD;  Location: [facility] INVASIVE CV LAB;  Service: Cardiovascular;   Laterality: N/A;   CARDIAC CATHETERIZATION N/A 09/07/2015   Procedure: Coronary Stent Intervention;  Surgeon: Marykay Lex, MD;  Location: [facility] INVASIVE CV LAB;  Service: Cardiovascular;  Laterality: N/A;   CORONARY ANGIOGRAM  09/07/13   residual RCA and OM disease   CORONARY ANGIOPLASTY WITH STENT PLACEMENT     CORONARY STENT INTERVENTION N/A 10/16/2018   Procedure: CORONARY STENT INTERVENTION;  Surgeon: Kathleene Hazel, MD;  Location: [facility] INVASIVE CV  LAB;  Service: Cardiovascular;  Laterality: N/A;   CORONARY STENT INTERVENTION N/A 03/11/2019   Procedure: CORONARY STENT INTERVENTION;  Surgeon: Corky Crafts, MD;  Location: [facility] INVASIVE CV LAB;  Service: Cardiovascular;  Laterality: N/A;   FRACTURE SURGERY     INCISION AND DRAINAGE OF WOUND Left 05/19/2019   Procedure: DEBRIDEMENT LEFT GROIN;  Surgeon: Violeta Gelinas, MD;  Location: [facility] OR;  Service: General;  Laterality: Left;   INSERTION OF ILIAC STENT Left 11/30/2017   Left external illiac stent   INSERTION OF ILIAC STENT  11/30/2017   Procedure: Insertion Of Iliac Stent;  Surgeon: Runell Gess, MD;  Location: [facility] INVASIVE CV LAB;  Service: Cardiovascular;;  Left external illiac stent   KNEE ARTHROSCOPY Left    KNEE SURGERY     "ligaments, cartilage; tendon, put a pin in" (11/30/2017)   LEFT HEART CATH Bilateral 07/08/2012   Procedure: LEFT HEART CATH;  Surgeon: Corky Crafts, MD;  Location: [facility] CATH LAB;  Service: Cardiovascular;  Laterality: Bilateral;   LEFT HEART CATH AND CORONARY ANGIOGRAPHY N/A 10/16/2018   Procedure: LEFT HEART CATH AND CORONARY ANGIOGRAPHY;  Surgeon: Kathleene Hazel, MD;  Location: [facility] INVASIVE CV LAB;  Service: Cardiovascular;  Laterality: N/A;   LEFT HEART CATH AND CORONARY ANGIOGRAPHY N/A 03/11/2019   Procedure: LEFT HEART CATH AND CORONARY ANGIOGRAPHY;  Surgeon: Corky Crafts, MD;  Location: [facility] INVASIVE CV LAB;  Service: Cardiovascular;  Laterality: N/A;   LEFT HEART  CATHETERIZATION WITH CORONARY ANGIOGRAM N/A 09/06/2013   STEMI, 2nd ISR LAD. Procedure: LEFT HEART CATHETERIZATION WITH CORONARY ANGIOGRAM;  Surgeon: Corky Crafts, MD;  Location: [facility] CATH LAB;  Service: Cardiovascular;  Laterality: N/A;   LOWER EXTREMITY ANGIOGRAPHY N/A 11/30/2017   Procedure: LOWER EXTREMITY ANGIOGRAPHY;  Surgeon: Runell Gess, MD;  Location: [facility] INVASIVE CV LAB;  Service: Cardiovascular;  Laterality: N/A;   PERCUTANEOUS CORONARY STENT INTERVENTION (PCI-S)  07/08/2012   Procedure: PERCUTANEOUS CORONARY STENT INTERVENTION (PCI-S);  Surgeon: Corky Crafts, MD;  Location: [facility] CATH LAB;  Service: Cardiovascular;;  DES LAD   PERCUTANEOUS CORONARY STENT INTERVENTION (PCI-S) N/A 09/06/2013   Procedure: PERCUTANEOUS CORONARY STENT INTERVENTION (PCI-S);  Surgeon: Corky Crafts, MD;  Location: [facility] CATH LAB;  Service: Cardiovascular;  Laterality: N/A;  Mid LAD 3.0/24mm Promus   THORACENTESIS Right 11/10/2021   Procedure: THORACENTESIS;  Surgeon: Leslye Peer, MD;  Location: [facility] ENDOSCOPY;  Service: Cardiopulmonary;  Laterality: Right;   VIDEO BRONCHOSCOPY WITH ENDOBRONCHIAL ULTRASOUND Right 11/12/2021   Procedure: VIDEO BRONCHOSCOPY WITH ENDOBRONCHIAL ULTRASOUND;  Surgeon: Omar Person, MD;  Location: [facility] ENDOSCOPY;  Service: Pulmonary;  Laterality: Right;   WOUND DEBRIDEMENT Left 05/20/2019   Procedure: DEBRIDEMENT GROIN;  Surgeon: Kinsinger, De Blanch, MD;  Location: [facility] OR;  Service: General;  Laterality: Left;   WRIST FRACTURE SURGERY Bilateral    Family History Family History  Problem Relation Age of Onset   Hypertension Mother    Diabetes Mother     Social History Social History   Tobacco Use   Smoking status: Every Day    Current packs/day: 0.50    Average packs/day: 0.5 packs/day for 48.0 years (24.0 ttl pk-yrs)    Types: Cigarettes   Smokeless tobacco: Never  Vaping Use   Vaping status: Never Used  Substance Use Topics   Alcohol use: Not  Currently    Comment: 11/30/2017 "might drink a beer q 6 months"   Drug use: Never   Allergies Clopidogrel  Review of Systems Review  of Systems  Constitutional:  Positive for appetite change, chills and fatigue.  Respiratory:  Positive for cough, chest tightness and shortness of breath.   Cardiovascular:  Positive for leg swelling.  Gastrointestinal:  Negative for abdominal pain, nausea and vomiting.  Genitourinary:  Negative for dysuria.  Musculoskeletal:  Positive for arthralgias.  Skin:  Negative for wound.  All other systems reviewed and are negative.   Physical Exam Vital Signs  I have reviewed the triage vital signs BP (!) 109/93   Pulse 76   Temp 97.9 F (36.6 C) (Oral)   Resp (!) 24   Ht 5\' 9"  (1.753 m)   Wt 117.9 kg   SpO2 96%   BMI 38.40 kg/m  Physical Exam Vitals and nursing note reviewed.  Constitutional:      General: He is not in acute distress.    Appearance: He is well-developed. He is obese.  HENT:     Head: Normocephalic and atraumatic.     Right Ear: External ear normal.     Left Ear: External ear normal.     Mouth/Throat:     Mouth: Mucous membranes are moist.  Eyes:     General: No scleral icterus. Cardiovascular:     Rate and Rhythm: Normal rate and regular rhythm.     Pulses: Normal pulses.     Heart sounds: Normal heart sounds.  Pulmonary:     Effort: Pulmonary effort is normal. Tachypnea present. No respiratory distress.     Breath sounds: Decreased air movement present. Wheezing and rales present.  Abdominal:     General: Abdomen is flat.     Palpations: Abdomen is soft.     Tenderness: There is no abdominal tenderness. There is no guarding.  Musculoskeletal:     Cervical back: No rigidity.     Right lower leg: Edema present.     Left lower leg: Edema present.  Skin:    General: Skin is warm and dry.     Capillary Refill: Capillary refill takes less than 2 seconds.  Neurological:     Mental Status: He is alert.  Psychiatric:         Mood and Affect: Mood normal.        Behavior: Behavior normal.     ED Results and Treatments Labs (all labs ordered are listed, but only abnormal results are displayed) Labs Reviewed  BASIC METABOLIC PANEL - Abnormal; Notable for the following components:      Result Value   Sodium 134 (*)    Glucose, Bld 159 (*)    BUN 32 (*)    Creatinine, Ser 1.79 (*)    Calcium 8.1 (*)    GFR, Estimated 41 (*)    All other components within normal limits  BRAIN NATRIURETIC PEPTIDE - Abnormal; Notable for the following components:   B Natriuretic Peptide 1,088.0 (*)    All other components within normal limits  CBC WITH DIFFERENTIAL/PLATELET - Abnormal; Notable for the following components:   WBC 13.8 (*)    Hemoglobin 9.9 (*)    HCT 33.7 (*)    MCV 78.6 (*)    MCH 23.1 (*)    MCHC 29.4 (*)    RDW 21.8 (*)    nRBC 0.8 (*)    Neutro Abs 11.0 (*)    Eosinophils Absolute 0.8 (*)    Abs Immature Granulocytes 0.16 (*)    All other components within normal limits  BLOOD GAS, VENOUS - Abnormal; Notable for the following components:  pO2, Ven <31 (*)    Bicarbonate 28.7 (*)    Acid-Base Excess 2.1 (*)    All other components within normal limits  TROPONIN I (HIGH SENSITIVITY)  TROPONIN I (HIGH SENSITIVITY)                                                                                                                          Radiology DG Chest Portable 1 View  Result Date: 10/21/2022 CLINICAL DATA:  Difficulty breathing.  Feet and leg swelling. EXAM: PORTABLE CHEST 1 VIEW COMPARISON:  11/12/2021. FINDINGS: Mild diffuse increased interstitial markings. Redemonstration of small right pleural effusion with associated partial obscuration of right medial hemidiaphragm and right heart border, favoring combination of right lung atelectasis and/or consolidation. Bilateral lungs are otherwise clear. Left lateral costophrenic angle is clear. Stable mildly enlarged cardio-mediastinal  silhouette. No acute osseous abnormalities. The soft tissues are within normal limits. IMPRESSION: 1. Mild congestive heart failure/pulmonary edema. 2. Stable small right pleural effusion with probable associated right lung atelectasis and/or consolidation. Correlate clinically. 3. Stable mild cardiomegaly. Electronically Signed   By: Jules Schick M.D.   On: 10/21/2022 11:07    Pertinent labs & imaging results that were available during my care of the patient were reviewed by me and considered in my medical decision making (see MDM for details).  Medications Ordered in ED Medications  cefTRIAXone (ROCEPHIN) 1 g in sodium chloride 0.9 % 100 mL IVPB (1 g Intravenous New Bag/Given 10/21/22 1142)  azithromycin (ZITHROMAX) 500 mg in sodium chloride 0.9 % 250 mL IVPB (has no administration in time range)  ipratropium-albuterol (DUONEB) 0.5-2.5 (3) MG/3ML nebulizer solution 3 mL (3 mLs Nebulization Given 10/21/22 1021)  furosemide (LASIX) injection 40 mg (40 mg Intravenous Given 10/21/22 1050)                                                                                                                                     Procedures .Critical Care  Performed by: Sloan Leiter, DO Authorized by: Sloan Leiter, DO   Critical care provider statement:    Critical care time (minutes):  30   Critical care time was exclusive of:  Separately billable procedures and treating other patients   Critical care was necessary to treat or prevent imminent or life-threatening deterioration of the following conditions:  Respiratory failure   Critical care was time spent personally by  me on the following activities:  Development of treatment plan with patient or surrogate, discussions with consultants, evaluation of patient's response to treatment, examination of patient, ordering and review of laboratory studies, ordering and review of radiographic studies, ordering and performing treatments and interventions, pulse  oximetry, re-evaluation of patient's condition, review of old charts and obtaining history from patient or surrogate   Care discussed with: admitting provider     (including critical care time)  Medical Decision Making / ED Course    Medical Decision Making:    [name] is a 67 y.o. male with past medical history as below, significant for CAD, CKD, cocaine abuse, GERD, hypertension, hyperlipidemia sleep apnea, ischemic cardiomyopathy who presents to the ED with complaint of dyspnea. The complaint involves an extensive differential diagnosis and also carries with it a high risk of complications and morbidity.  Serious etiology was considered. Ddx includes but is not limited to: In my evaluation of this patient's dyspnea my DDx includes, but is not limited to, pneumonia, pulmonary embolism, pneumothorax, pulmonary edema, metabolic acidosis, asthma, COPD, cardiac cause, anemia, anxiety, etc.    Complete initial physical exam performed, notably the patient  was hypoxia on room air, placed on 3 L nasal cannula improvement to 95% pulse ox.  Work of breathing improved on cannula.    Reviewed and confirmed nursing documentation for past medical history, family history, social history.  Vital signs reviewed.    Clinical Course as of 10/21/22 1201  Fri Oct 21, 2022  0915 SpO2(!): 86 % [SG]  0916 O2 Device: Room Air No home o2 [SG]  1014 Creatinine(!): 1.79 Similar to prior [SG]  1014 BUN(!): 32 Similar to prior  [SG]  1044 B Natriuretic Peptide(!): 1,088.0 Appears volume overloaded on exam, hypoxia. Start diuresis; he has CKD [SG]  1046 Hemoglobin(!): 9.9 Prior around 11-12, he has CKD, no melena or overt bleeding, anemia of chronic disease likely factor here  [SG]  1111 CXR with effusion, pos pna.  [SG]    Clinical Course User Index [SG] Tanda Rockers A, DO     Difficulty breathing, worsening over the past week.  Some wheezing on exam, breath sounds diminished.  Tobacco abuse.   Will give DuoNeb.  Check screening labs.  Continue nasal cannula  Patient appears volume overloaded on exam, start Lasix.  Chest x-ray consistent with CHF, possible underlying infiltrate.  Will cover with antibiotics given elevated WBC, cough, chills.  Recommend admission given hypoxia, CHF, questionable pneumonia  ECHO 10/23 LVEF 25-30% G3DD    Recommend admission, pt agreeable  Admitted TRH            Additional history obtained: -Additional history obtained from na -External records from outside source obtained and reviewed including: Chart review including previous notes, labs, imaging, consultation notes including  Prior labs Home meds Prior echo   Lab Tests: -I ordered, reviewed, and interpreted labs.   The pertinent results include:   Labs Reviewed  BASIC METABOLIC PANEL - Abnormal; Notable for the following components:      Result Value   Sodium 134 (*)    Glucose, Bld 159 (*)    BUN 32 (*)    Creatinine, Ser 1.79 (*)    Calcium 8.1 (*)    GFR, Estimated 41 (*)    All other components within normal limits  BRAIN NATRIURETIC PEPTIDE - Abnormal; Notable for the following components:   B Natriuretic Peptide 1,088.0 (*)    All other components within normal limits  CBC WITH DIFFERENTIAL/PLATELET - Abnormal; Notable for the following components:   WBC 13.8 (*)    Hemoglobin 9.9 (*)    HCT 33.7 (*)    MCV 78.6 (*)    MCH 23.1 (*)    MCHC 29.4 (*)    RDW 21.8 (*)    nRBC 0.8 (*)    Neutro Abs 11.0 (*)    Eosinophils Absolute 0.8 (*)    Abs Immature Granulocytes 0.16 (*)    All other components within normal limits  BLOOD GAS, VENOUS - Abnormal; Notable for the following components:   pO2, Ven <31 (*)    Bicarbonate 28.7 (*)    Acid-Base Excess 2.1 (*)    All other components within normal limits  TROPONIN I (HIGH SENSITIVITY)  TROPONIN I (HIGH SENSITIVITY)    Notable for as above  EKG   EKG Interpretation Date/Time:  Friday October 21 2022  09:06:23 EDT Ventricular Rate:  75 PR Interval:  220 QRS Duration:  85 QT Interval:  495 QTC Calculation: 553 R Axis:   -89  Text Interpretation: Sinus rhythm Prolonged PR interval LAD, consider left anterior fascicular block Anterolateral infarct, age indeterminate Prolonged QT interval Confirmed by Tanda Rockers ([id number]) on 10/21/2022 11:13:43 AM         Imaging Studies ordered: I ordered imaging studies including CXR I independently visualized the following imaging with scope of interpretation limited to determining acute life threatening conditions related to emergency care; findings noted above I independently visualized and interpreted imaging. I agree with the radiologist interpretation   Medicines ordered and prescription drug management: Meds ordered this encounter  Medications   ipratropium-albuterol (DUONEB) 0.5-2.5 (3) MG/3ML nebulizer solution 3 mL   furosemide (LASIX) injection 40 mg   cefTRIAXone (ROCEPHIN) 1 g in sodium chloride 0.9 % 100 mL IVPB    Order Specific Question:   Antibiotic Indication:    Answer:   CAP   azithromycin (ZITHROMAX) 500 mg in sodium chloride 0.9 % 250 mL IVPB    Order Specific Question:   Antibiotic Indication:    Answer:   CAP    -I have reviewed the patients home medicines and have made adjustments as needed   Consultations Obtained: na   Cardiac Monitoring: The patient was maintained on a cardiac monitor.  I personally viewed and interpreted the cardiac monitored which showed an underlying rhythm of: NSR  Social Determinants of Health:  Diagnosis or treatment significantly limited by social determinants of health: current smoker   Reevaluation: After the interventions noted above, I reevaluated the patient and found that they have improved  Co morbidities that complicate the patient evaluation  Past Medical History:  Diagnosis Date   Bulging lumbar disc    CAD (coronary artery disease) [phone]   a. prior LAD stenting.  b. s/p DES to [facility] 08/2015. c. 04/2016 Cardiac cath at [facility]. Patent stent in the PLAD and RCA. Diffuse dLAD, OM2, and  RPDA disease. d. DES to PDA and distal RCA 03/2019    Chronic lower back pain    CKD (chronic kidney disease), stage II    Cocaine abuse (HCC)    Cyst of epididymis    DM2 (diabetes mellitus, type 2) (HCC)    Essential hypertension    GERD (gastroesophageal reflux disease)    Headache    History of pneumonia    Hyperlipidemia    Ischemic cardiomyopathy    LV (left ventricular) mural thrombus    Sleep apnea  Dispostion: Disposition decision including need for hospitalization was considered, and patient admitted to the hospital.    Final Clinical Impression(s) / ED Diagnoses Final diagnoses:  Acute congestive heart failure, unspecified heart failure type (HCC)  Hypoxia  Community acquired pneumonia, unspecified laterality        Sloan Leiter, DO 10/21/22 1201

## 2022-10-21 NOTE — Assessment & Plan Note (Signed)
-  No chest pain -Continue beta-blocker, statin and baby aspirin -Continue telemetry monitoring -Update 2D echo. -Patient encouraged to stop smoking and to be compliant with outpatient follow-up with cardiology service.

## 2022-10-21 NOTE — Assessment & Plan Note (Signed)
-  Continue the use of carvedilol and currently IV diuretics -Heart healthy/low-sodium diet discussed with patient -Maintain adequate hydration -Follow vital signs.

## 2022-10-21 NOTE — Assessment & Plan Note (Signed)
-  Prior ejection fraction around 30% -Patient educated about importance of low-sodium diet, daily weights and adequate hydration -Instructed to be medically compliant with treatment and outpatient follow-up with cardiology service (patient expressed no following that). -IV diuresis and the use of beta-blocker has been restarted -Update 2D echo -Follow strict intake and output -Depending on clinical response and echo results might benefit of cardiology service inputs for further GDMT adjustments.

## 2022-10-21 NOTE — Assessment & Plan Note (Addendum)
-  Continue CPAP nightly. -Discussed with patient.

## 2022-10-21 NOTE — Progress Notes (Signed)
Echocardiogram 2D Echocardiogram has been performed.  [name] 10/21/2022, 3:24 PM

## 2022-10-21 NOTE — Assessment & Plan Note (Signed)
-  With concerns for community-acquired pneumonia along with CHF exacerbation -On presentation vascular congestion and left lower lobe opacities/infiltrates appreciated. -Patient with elevated WBCs, complaining of subjective fever and intermittent coughing spells prior to admission -Also found with elevated BNP, increased lower extremity swelling and complaints of orthopnea -Will check procalcitonin; cover with antibiotics and provide bronchodilators/mucolytic's agents. -IV diuresis and further treatment for CHF has been initiated (please refer to acute on chronic systolic heart failure problem for further details on management). -Wean off oxygen supplementation.

## 2022-10-21 NOTE — Assessment & Plan Note (Signed)
-  I have discussed tobacco cessation with the patient.  I have counseled the patient regarding the negative impacts of continued tobacco use including but not limited to lung cancer, COPD, and cardiovascular disease.  I have discussed alternatives to tobacco and modalities that may help facilitate tobacco cessation including but not limited to biofeedback, hypnosis, and medications.  Total time spent with tobacco counseling was 4 minutes. -Nicotine patch has been ordered.

## 2022-10-22 DIAGNOSIS — N183 Chronic kidney disease, stage 3 unspecified: Secondary | ICD-10-CM

## 2022-10-22 DIAGNOSIS — I2510 Atherosclerotic heart disease of native coronary artery without angina pectoris: Secondary | ICD-10-CM | POA: Diagnosis not present

## 2022-10-22 DIAGNOSIS — E1122 Type 2 diabetes mellitus with diabetic chronic kidney disease: Secondary | ICD-10-CM | POA: Diagnosis not present

## 2022-10-22 DIAGNOSIS — G4733 Obstructive sleep apnea (adult) (pediatric): Secondary | ICD-10-CM

## 2022-10-22 DIAGNOSIS — I10 Essential (primary) hypertension: Secondary | ICD-10-CM | POA: Diagnosis not present

## 2022-10-22 DIAGNOSIS — F17200 Nicotine dependence, unspecified, uncomplicated: Secondary | ICD-10-CM

## 2022-10-22 DIAGNOSIS — I5023 Acute on chronic systolic (congestive) heart failure: Secondary | ICD-10-CM

## 2022-10-22 DIAGNOSIS — F1490 Cocaine use, unspecified, uncomplicated: Secondary | ICD-10-CM

## 2022-10-22 DIAGNOSIS — E1149 Type 2 diabetes mellitus with other diabetic neurological complication: Secondary | ICD-10-CM

## 2022-10-22 LAB — CBC
HCT: 31.2 % — ABNORMAL LOW (ref 39.0–52.0)
Hemoglobin: 9.1 g/dL — ABNORMAL LOW (ref 13.0–17.0)
MCH: 23.2 pg — ABNORMAL LOW (ref 26.0–34.0)
MCHC: 29.2 g/dL — ABNORMAL LOW (ref 30.0–36.0)
MCV: 79.4 fL — ABNORMAL LOW (ref 80.0–100.0)
Platelets: 229 10*3/uL (ref 150–400)
RBC: 3.93 MIL/uL — ABNORMAL LOW (ref 4.22–5.81)
RDW: 21.5 % — ABNORMAL HIGH (ref 11.5–15.5)
WBC: 13.4 10*3/uL — ABNORMAL HIGH (ref 4.0–10.5)
nRBC: 0.4 % — ABNORMAL HIGH (ref 0.0–0.2)

## 2022-10-22 LAB — BASIC METABOLIC PANEL
Anion gap: 7 (ref 5–15)
BUN: 30 mg/dL — ABNORMAL HIGH (ref 8–23)
CO2: 27 mmol/L (ref 22–32)
Calcium: 8.3 mg/dL — ABNORMAL LOW (ref 8.9–10.3)
Chloride: 105 mmol/L (ref 98–111)
Creatinine, Ser: 1.66 mg/dL — ABNORMAL HIGH (ref 0.61–1.24)
GFR, Estimated: 45 mL/min — ABNORMAL LOW (ref >60)
Glucose, Bld: 158 mg/dL — ABNORMAL HIGH (ref 70–99)
Potassium: 4.2 mmol/L (ref 3.5–5.1)
Sodium: 139 mmol/L (ref 135–145)

## 2022-10-22 LAB — GLUCOSE, CAPILLARY
Glucose-Capillary: 153 mg/dL — ABNORMAL HIGH (ref 70–99)
Glucose-Capillary: 160 mg/dL — ABNORMAL HIGH (ref 70–99)
Glucose-Capillary: 181 mg/dL — ABNORMAL HIGH (ref 70–99)
Glucose-Capillary: 118 mg/dL — ABNORMAL HIGH (ref 70–99)
Glucose-Capillary: 205 mg/dL — ABNORMAL HIGH (ref 70–99)

## 2022-10-22 LAB — SARS CORONAVIRUS 2 (TAT 6-24 HRS): SARS Coronavirus 2: NEGATIVE

## 2022-10-22 NOTE — Assessment & Plan Note (Signed)
CKD stage 3b, hyponatremia.   Improved volume status, renal function today with serum cr at 1,61 with K at 4,1 and serum bicarbonate at 26.  Na 134   Transition to oral furosemide in am and add SGLT 2 inh.  Possible addition of ARB when renal function more stable.

## 2022-10-22 NOTE — Assessment & Plan Note (Addendum)
C pap  Obesity class 2 with BMI at 35.1

## 2022-10-22 NOTE — Progress Notes (Addendum)
Patient daughter called and stated, " my dad called me and said my mother who is deceased came in the room to come pick him up". I went to patient room to check on him. He states" I would just like some ginger ale and I called my daughter to tell her that someone who looked like my wife came in and it scared me because I was half asleep". Patient daughter reassured. Also stated that patient has a brain tumor and this could be the cause. No scans in chart since 10/2021.  Daughter also stated that if patient needs scan to please sedate him prior due to claustrophobia in MRI. Given ginger ale. Vital signs are stable, blood glucose is 153 at this time. Resting in bed. Plan of care on going.

## 2022-10-22 NOTE — Assessment & Plan Note (Signed)
Continue glucose cover and monitoring with insulin sliding scale. His glucose has been controlled with fasting glucose today 171 mg/dl.

## 2022-10-22 NOTE — Progress Notes (Signed)
Progress Note   Patient: [name] ZOX:[id number] DOB: 21-Sep-1955 DOA: 10/21/2022     1 DOS: the patient was seen and examined on 10/22/2022   Brief hospital course: Mr. Shriber was admitted to the hospital with the working diagnosis of acute heart failure decompensation.   67 yo male with the past medical history of coronary artery disease, hypertension, dyslipidemia, T2DM, GERD, and cocaine abuse who presented with dyspnea and edema. Reported worsening lower extremity and orthopnea for 2 weeks, more severe over the last 3 days prior to admission. On his initial physical examination his blood pressure was 109/93, HR 76, rr 24 and 02 saturation 96%, lungs with rales at bases with no wheezing or rhonchi, heart with S1 and S2 present and regular with no gallops or rubs, positive JVD, abdomen with no distention, non tender and positive lower extremity edema.   Chest radiograph with right rotation, positive cardiomegaly, bilateral hilar vascular congestion, bilateral central interstitial infiltrates with small right pleural effusion.   Patient was placed on furosemide for diuresis.   Assessment and Plan: * Acute on chronic systolic CHF (congestive heart failure) (HCC) Echocardiogram with reduced LV systolic function with EF 20 to 25%, global hypokinesis, interventricular septum is flattened in systole and diastole, RV with mild enlarged cavity and decreased systolic function, LV with entire apex akinetic,   Documented urine output is 250 ml Systolic blood pressure 107 to 113 mmHg.   Plan to continue diuresis with IV furosemide.  Limited pharmacologic options due to reduced GFR.   Acute hypoxemic respiratory failure due to acute cardiogenic pulmonary edema.  02 saturation today is 99% on room air.  Pneumonia has been ruled out, stop antibiotic therapy.   Stage 3 chronic renal impairment associated with type 2 diabetes mellitus (HCC) CKD stage 3b  Renal function with serum cr at 1,6  with K at 4,3 and serum bicarbonate at 27.  Na 139  Plan to continue diuresis with IV furosemide.  Strict in and out monitoring  Follow up renal function in am.   Coronary artery disease No chest pain.   Essential hypertension Continue close blood pressure monitoring.  Continue diuresis with furosemide.   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 His glucose has been controlled with capillary 160, 181 and 118   Tobacco use disorder Smoking cessation counseling.   Cocaine use No toxicology screen on this admission. Tested positive for cocaine on 2021.   OSA on CPAP C pap  Obesity class 2 with BMI at 35.1         Subjective: patient with improvement in dyspnea, and edema, no chest pain   Physical Exam: Vitals:   10/21/22 1753 10/21/22 2052 10/22/22 0455 10/22/22 1100  BP: 110/72 107/76 113/73   Pulse: 73 79 71   Resp:  18 18   Temp: 98.4 F (36.9 C) 97.6 F (36.4 C) 97.7 F (36.5 C)   TempSrc: Oral Oral Oral   SpO2: 99% 95% 96% 99%  Weight:   108.1 kg   Height:       Neurology awake and alert ENT with mild pallor Cardiovascular with S1 and S2 present and regular with no gallops, rubs or murmurs Respiratory with no rales or wheezing, no rhonchi Abdomen with no distention  Lower extremity edema ++  Data Reviewed:    Family Communication: no family at the bedside   Disposition: Status is: Inpatient Remains inpatient appropriate because: heart failure  Planned Discharge Destination: Home     Author: Coralie Keens, MD 10/22/2022 4:48 PM  For on call review [email or web address].

## 2022-10-22 NOTE — Assessment & Plan Note (Addendum)
Echocardiogram with reduced LV systolic function with EF 20 to 25%, global hypokinesis, interventricular septum is flattened in systole and diastole, RV with mild enlarged cavity and decreased systolic function, LV with entire apex akinetic,   Urine output 3,501 ml Systolic blood pressure 104 to 113 mmHg.   Transition to oral furosemide and add SGLT 2 inh. Continue with carvedilol.  Holding on RAAS inhibition due to recovering AKI.  Limited pharmacologic options due to acutely reduced GFR (AKI), and risk of hypotension.   Acute hypoxemic respiratory failure due to acute cardiogenic pulmonary edema.  02 saturation today is 99% on room air.  Resolved respiratory failure.  Pneumonia has been ruled out

## 2022-10-22 NOTE — Assessment & Plan Note (Signed)
No chest pain No acute coronary syndrome.

## 2022-10-22 NOTE — Assessment & Plan Note (Signed)
Continue close blood pressure monitoring.  Continue diuresis with furosemide.

## 2022-10-22 NOTE — Assessment & Plan Note (Signed)
No toxicology screen on this admission. Tested positive for cocaine on 2021.

## 2022-10-22 NOTE — Plan of Care (Signed)
Problem: Education: Goal: Knowledge of disease or condition will improve Outcome: Progressing Goal: Knowledge of secondary prevention will improve (MUST DOCUMENT ALL) Outcome: Progressing Goal: Knowledge of patient specific risk factors will improve [name] N/A or DELETE if not current risk factor) Outcome: Progressing   Problem: Ischemic Stroke/TIA Tissue Perfusion: Goal: Complications of ischemic stroke/TIA will be minimized Outcome: Progressing   Problem: Coping: Goal: Will verbalize positive feelings about self Outcome: Progressing Goal: Will identify appropriate support needs Outcome: Progressing   Problem: Health Behavior/Discharge Planning: Goal: Ability to manage health-related needs will improve Outcome: Progressing Goal: Goals will be collaboratively established with patient/family Outcome: Progressing   Problem: Self-Care: Goal: Ability to participate in self-care as condition permits will improve Outcome: Progressing Goal: Verbalization of feelings and concerns over difficulty with self-care will improve Outcome: Progressing Goal: Ability to communicate needs accurately will improve Outcome: Progressing   Problem: Nutrition: Goal: Risk of aspiration will decrease Outcome: Progressing Goal: Dietary intake will improve Outcome: Progressing   Problem: Education: Goal: Ability to demonstrate management of disease process will improve Outcome: Progressing Goal: Ability to verbalize understanding of medication therapies will improve Outcome: Progressing Goal: Individualized Educational Video(s) Outcome: Progressing   Problem: Activity: Goal: Capacity to carry out activities will improve Outcome: Progressing   Problem: Cardiac: Goal: Ability to achieve and maintain adequate cardiopulmonary perfusion will improve Outcome: Progressing   Problem: Activity: Goal: Ability to tolerate increased activity will improve Outcome: Progressing   Problem: Clinical  Measurements: Goal: Ability to maintain a body temperature in the normal range will improve Outcome: Progressing   Problem: Respiratory: Goal: Ability to maintain adequate ventilation will improve Outcome: Progressing Goal: Ability to maintain a clear airway will improve Outcome: Progressing   Problem: Education: Goal: Knowledge of General Education information will improve Description: Including pain rating scale, medication(s)/side effects and non-pharmacologic comfort measures Outcome: Progressing   Problem: Health Behavior/Discharge Planning: Goal: Ability to manage health-related needs will improve Outcome: Progressing   Problem: Clinical Measurements: Goal: Ability to maintain clinical measurements within normal limits will improve Outcome: Progressing Goal: Will remain free from infection Outcome: Progressing Goal: Diagnostic test results will improve Outcome: Progressing Goal: Respiratory complications will improve Outcome: Progressing Goal: Cardiovascular complication will be avoided Outcome: Progressing   Problem: Activity: Goal: Risk for activity intolerance will decrease Outcome: Progressing   Problem: Nutrition: Goal: Adequate nutrition will be maintained Outcome: Progressing   Problem: Coping: Goal: Level of anxiety will decrease Outcome: Progressing   Problem: Elimination: Goal: Will not experience complications related to bowel motility Outcome: Progressing Goal: Will not experience complications related to urinary retention Outcome: Progressing   Problem: Pain Managment: Goal: General experience of comfort will improve Outcome: Progressing   Problem: Safety: Goal: Ability to remain free from injury will improve Outcome: Progressing   Problem: Skin Integrity: Goal: Risk for impaired skin integrity will decrease Outcome: Progressing   Problem: Education: Goal: Ability to describe self-care measures that may prevent or decrease complications  (Diabetes Survival Skills Education) will improve Outcome: Progressing Goal: Individualized Educational Video(s) Outcome: Progressing   Problem: Coping: Goal: Ability to adjust to condition or change in health will improve Outcome: Progressing   Problem: Fluid Volume: Goal: Ability to maintain a balanced intake and output will improve Outcome: Progressing   Problem: Health Behavior/Discharge Planning: Goal: Ability to identify and utilize available resources and services will improve Outcome: Progressing Goal: Ability to manage health-related needs will improve Outcome: Progressing   Problem: Metabolic: Goal: Ability to maintain appropriate glucose levels will improve  Outcome: Progressing   Problem: Nutritional: Goal: Maintenance of adequate nutrition will improve Outcome: Progressing Goal: Progress toward achieving an optimal weight will improve Outcome: Progressing   Problem: Skin Integrity: Goal: Risk for impaired skin integrity will decrease Outcome: Progressing

## 2022-10-22 NOTE — Assessment & Plan Note (Signed)
Smoking cessation counseling.

## 2022-10-22 NOTE — Hospital Course (Signed)
[name] was admitted to the hospital with the working diagnosis of acute heart failure decompensation.   67 yo male with the past medical history of coronary artery disease, hypertension, dyslipidemia, T2DM, GERD, and cocaine abuse who presented with dyspnea and edema. Reported worsening lower extremity and orthopnea for 2 weeks, more severe over the last 3 days prior to admission. On his initial physical examination his blood pressure was 109/93, HR 76, rr 24 and 02 saturation 96%, lungs with rales at bases with no wheezing or rhonchi, heart with S1 and S2 present and regular with no gallops or rubs, positive JVD, abdomen with no distention, non tender and positive lower extremity edema.   Na 134, K 3,7 Cl 102, bicarbonate 24, glucose 159, bun 32, cr 1,79  BNP 1,088 High sensitive troponin 10 and 10  Wbc 13,8 hgb 9,1 plt 229  Sars covid 19 negative   Chest radiograph with right rotation, positive cardiomegaly, bilateral hilar vascular congestion, bilateral central interstitial infiltrates with small right pleural effusion.   EKG 75 bpm, left axis deviation, qtc 553, sinus rhythm with first degree AV block, poor R R wave progression, q wave v2 to v6, with no significant ST segment or T wave changes.   Patient was placed on furosemide for diuresis with improvement in volume status.   10/13 transition to oral furosemide, possible discharge tomorrow home.

## 2022-10-23 DIAGNOSIS — I10 Essential (primary) hypertension: Secondary | ICD-10-CM | POA: Diagnosis not present

## 2022-10-23 DIAGNOSIS — I5023 Acute on chronic systolic (congestive) heart failure: Secondary | ICD-10-CM | POA: Diagnosis not present

## 2022-10-23 DIAGNOSIS — I2510 Atherosclerotic heart disease of native coronary artery without angina pectoris: Secondary | ICD-10-CM | POA: Diagnosis not present

## 2022-10-23 DIAGNOSIS — E1122 Type 2 diabetes mellitus with diabetic chronic kidney disease: Secondary | ICD-10-CM | POA: Diagnosis not present

## 2022-10-23 LAB — BASIC METABOLIC PANEL
Anion gap: 9 (ref 5–15)
BUN: 28 mg/dL — ABNORMAL HIGH (ref 8–23)
CO2: 26 mmol/L (ref 22–32)
Calcium: 8 mg/dL — ABNORMAL LOW (ref 8.9–10.3)
Chloride: 99 mmol/L (ref 98–111)
Creatinine, Ser: 1.61 mg/dL — ABNORMAL HIGH (ref 0.61–1.24)
GFR, Estimated: 46 mL/min — ABNORMAL LOW (ref >60)
Glucose, Bld: 171 mg/dL — ABNORMAL HIGH (ref 70–99)
Potassium: 4.1 mmol/L (ref 3.5–5.1)
Sodium: 134 mmol/L — ABNORMAL LOW (ref 135–145)

## 2022-10-23 LAB — GLUCOSE, CAPILLARY
Glucose-Capillary: 173 mg/dL — ABNORMAL HIGH (ref 70–99)
Glucose-Capillary: 155 mg/dL — ABNORMAL HIGH (ref 70–99)
Glucose-Capillary: 120 mg/dL — ABNORMAL HIGH (ref 70–99)
Glucose-Capillary: 218 mg/dL — ABNORMAL HIGH (ref 70–99)

## 2022-10-23 MED ORDER — FUROSEMIDE 40 MG PO TABS
40.0000 mg | ORAL_TABLET | Freq: Every day | ORAL | Status: DC
  Administered 2022-10-24 – 2022-10-25 (×2): 40 mg via ORAL
  Filled 2022-10-23 (×2): qty 1

## 2022-10-23 MED ORDER — EMPAGLIFLOZIN 10 MG PO TABS
10.0000 mg | ORAL_TABLET | Freq: Every day | ORAL | Status: DC
  Administered 2022-10-23 – 2022-10-25 (×3): 10 mg via ORAL
  Filled 2022-10-23 (×3): qty 1

## 2022-10-23 NOTE — Evaluation (Signed)
Physical Therapy Evaluation Patient Details Name: [name] MRN: [id number] DOB: 01-Sep-1955 Today's Date: 10/23/2022  History of Present Illness  a 67 y.o. male with medical history significant of coronary artery disease, tobacco abuse, hypertension, hyperlipidemia, gastroesophageal flux disease, type 2 diabetes with nephropathy, history of cocaine abuse, obstructive sleep apnea, medication noncompliance and systolic heart failure; who presented to the hospital secondary to shortness of breath, intermittent coughing spells (productive; without hemoptysis), increased lower extremity swelling, orthopnea and subjective fever.  Patient reports lower extremity swelling and orthopnea has been present and progressing for over 2 weeks and in the last 3 days prior to admission has noticed subjective fever productive coughing spells and worsening shortness of breath.  Clinical Impression   Pt is a 67yo male presenting to [facility] due to reason stated in the HPI.  Pt tolerating today's Physical Therapy Evaluation, showing mild deviations from baseline noted by, unsteadiness during transfers given supervision. When ambulating modified independnet with slow labored movement. These deficits noted due to generalized weakness, deconditioning. Based upon these deficits/impairments, pt would benefit from skilled acute physical therapy services to address the above deficits and improve their functional status. PT recommends pt discharge to Home with HHPT in order to improve pt's functional status, safety and independence with functional mobility and overall QOL.             If plan is discharge home, recommend the following: A little help with walking and/or transfers   Can travel by private vehicle        Equipment Recommendations None recommended by PT  Recommendations for Other Services       Functional Status Assessment Patient has had a recent decline in their  functional status and demonstrates the ability to make significant improvements in function in a reasonable and predictable amount of time.     Precautions / Restrictions Precautions Precautions: None Restrictions Weight Bearing Restrictions: No      Mobility  Bed Mobility Overal bed mobility: Modified Independent             General bed mobility comments: Use of bed rail for in/out of bed with slow, labored movements, HOB elevated. Patient Response: Cooperative  Transfers Overall transfer level: Needs assistance   Transfers: Sit to/from Stand Sit to Stand: Supervision           General transfer comment: supervision with multiple rocks for momentum when performing 2x sit/stands. Pt utilizing bed rail and pushing from EOB with BUE. slow  and labored    Ambulation/Gait Ambulation/Gait assistance: Modified independent (Device/Increase time) Gait Distance (Feet): 30 Feet Assistive device: Rolling walker (2 wheels) Gait Pattern/deviations: Decreased step length - right, Decreased step length - left, Decreased dorsiflexion - left, Decreased dorsiflexion - right       General Gait Details: slow, labored movement with poor ankle DF during ambulation.  Stairs            Wheelchair Mobility     Tilt Bed Tilt Bed Patient Response: Cooperative  Modified Rankin (Stroke Patients Only)       Balance                                             Pertinent Vitals/Pain Pain Assessment Pain Assessment: No/denies pain    Home Living Family/patient expects to be discharged to:: Private residence Living Arrangements: Spouse/significant other;Children  Available Help at Discharge: Family;Friend(s);Available PRN/intermittently;Personal care attendant Type of Home: Mobile home Home Access: Stairs to enter Entrance Stairs-Rails: Right;Left;Can reach both Entrance Stairs-Number of Steps: 2   Home Layout: One level Home Equipment: Rollator (4  wheels);Shower seat;Toilet riser      Prior Function Prior Level of Function : Independent/Modified Independent             Mobility Comments: Pt reports having a rollator at home for mobility ADLs Comments: Doesn't drive and says he has help with shopping. independent with ADLs     Extremity/Trunk Assessment   Upper Extremity Assessment Upper Extremity Assessment: Overall WFL for tasks assessed    Lower Extremity Assessment Lower Extremity Assessment: RLE deficits/detail;LLE deficits/detail RLE Deficits / Details: 3+/5 gross RLE RLE Sensation: WNL LLE Deficits / Details: 3+/5 gross LLE LLE Sensation: WNL       Communication   Communication Communication: No apparent difficulties  Cognition Arousal: Alert Behavior During Therapy: WFL for tasks assessed/performed Overall Cognitive Status: Within Functional Limits for tasks assessed                                          General Comments      Exercises     Assessment/Plan    PT Assessment Patient needs continued PT services  PT Problem List Decreased strength;Decreased activity tolerance;Decreased balance;Decreased mobility       PT Treatment Interventions Gait training;Functional mobility training;Therapeutic activities;Therapeutic exercise;Balance training;Neuromuscular re-education    PT Goals (Current goals can be found in the Care Plan section)  Acute Rehab PT Goals Patient Stated Goal: go home PT Goal Formulation: With patient Time For Goal Achievement: 11/06/22 Potential to Achieve Goals: Good    Frequency Min 2X/week     Co-evaluation               AM-PAC PT "6 Clicks" Mobility  Outcome Measure Help needed turning from your back to your side while in a flat bed without using bedrails?: None Help needed moving from lying on your back to sitting on the side of a flat bed without using bedrails?: None Help needed moving to and from a bed to a chair (including a  wheelchair)?: None Help needed standing up from a chair using your arms (e.g., wheelchair or bedside chair)?: None Help needed to walk in hospital room?: A Little Help needed climbing 3-5 steps with a railing? : A Lot 6 Click Score: 21    End of Session Equipment Utilized During Treatment: Gait belt Activity Tolerance: No increased pain Patient left: in bed;with call bell/phone within reach;with bed alarm set Nurse Communication: Mobility status PT Visit Diagnosis: Other abnormalities of gait and mobility (R26.89);Muscle weakness (generalized) (M62.81)    Time: 7846-9629 PT Time Calculation (min) (ACUTE ONLY): 12 min   Charges:   PT Evaluation $PT Eval Low Complexity: 1 Low   PT General Charges $$ ACUTE PT VISIT: 1 Visit         Nelida Meuse PT, DPT Physical Therapist with [facility] 336 [phone] office  Nelida Meuse 10/23/2022, 9:22 AM

## 2022-10-23 NOTE — Progress Notes (Signed)
Patient pulled out his IV. This Clinical research associate and 2 other nurse attempted to restart an IV on patient and was unsuccessful. ED RN contacted and [facility] to attempt to get IV access for patient.

## 2022-10-23 NOTE — Plan of Care (Signed)
Problem: Education: Goal: Knowledge of disease or condition will improve Outcome: Not Progressing Goal: Knowledge of secondary prevention will improve (MUST DOCUMENT ALL) Outcome: Not Progressing Goal: Knowledge of patient specific risk factors will improve [name] N/A or DELETE if not current risk factor) Outcome: Not Progressing   Problem: Ischemic Stroke/TIA Tissue Perfusion: Goal: Complications of ischemic stroke/TIA will be minimized Outcome: Progressing   Problem: Coping: Goal: Will verbalize positive feelings about self Outcome: Progressing Goal: Will identify appropriate support needs Outcome: Progressing   Problem: Health Behavior/Discharge Planning: Goal: Ability to manage health-related needs will improve Outcome: Not Progressing Goal: Goals will be collaboratively established with patient/family Outcome: Progressing   Problem: Self-Care: Goal: Ability to participate in self-care as condition permits will improve Outcome: Not Progressing Goal: Verbalization of feelings and concerns over difficulty with self-care will improve Outcome: Progressing Goal: Ability to communicate needs accurately will improve Outcome: Progressing   Problem: Nutrition: Goal: Risk of aspiration will decrease Outcome: Progressing Goal: Dietary intake will improve Outcome: Progressing   Problem: Education: Goal: Ability to demonstrate management of disease process will improve Outcome: Not Progressing Goal: Ability to verbalize understanding of medication therapies will improve Outcome: Not Progressing Goal: Individualized Educational Video(s) Outcome: Progressing   Problem: Activity: Goal: Capacity to carry out activities will improve Outcome: Progressing   Problem: Cardiac: Goal: Ability to achieve and maintain adequate cardiopulmonary perfusion will improve Outcome: Progressing   Problem: Activity: Goal: Ability to tolerate increased activity will improve Outcome:  Progressing   Problem: Clinical Measurements: Goal: Ability to maintain a body temperature in the normal range will improve Outcome: Progressing   Problem: Respiratory: Goal: Ability to maintain adequate ventilation will improve Outcome: Progressing Goal: Ability to maintain a clear airway will improve Outcome: Progressing   Problem: Education: Goal: Knowledge of General Education information will improve Description: Including pain rating scale, medication(s)/side effects and non-pharmacologic comfort measures Outcome: Progressing   Problem: Health Behavior/Discharge Planning: Goal: Ability to manage health-related needs will improve Outcome: Progressing   Problem: Clinical Measurements: Goal: Ability to maintain clinical measurements within normal limits will improve Outcome: Progressing Goal: Will remain free from infection Outcome: Progressing Goal: Diagnostic test results will improve Outcome: Progressing Goal: Respiratory complications will improve Outcome: Progressing Goal: Cardiovascular complication will be avoided Outcome: Progressing   Problem: Activity: Goal: Risk for activity intolerance will decrease Outcome: Progressing   Problem: Activity: Goal: Risk for activity intolerance will decrease Outcome: Progressing   Problem: Nutrition: Goal: Adequate nutrition will be maintained Outcome: Progressing   Problem: Coping: Goal: Level of anxiety will decrease Outcome: Progressing   Problem: Elimination: Goal: Will not experience complications related to bowel motility Outcome: Progressing Goal: Will not experience complications related to urinary retention Outcome: Progressing   Problem: Pain Managment: Goal: General experience of comfort will improve Outcome: Progressing   Problem: Safety: Goal: Ability to remain free from injury will improve Outcome: Progressing   Problem: Skin Integrity: Goal: Risk for impaired skin integrity will  decrease Outcome: Progressing   Problem: Education: Goal: Ability to describe self-care measures that may prevent or decrease complications (Diabetes Survival Skills Education) will improve Outcome: Progressing Goal: Individualized Educational Video(s) Outcome: Progressing   Problem: Coping: Goal: Ability to adjust to condition or change in health will improve Outcome: Progressing   Problem: Fluid Volume: Goal: Ability to maintain a balanced intake and output will improve Outcome: Progressing   Problem: Health Behavior/Discharge Planning: Goal: Ability to identify and utilize available resources and services will improve Outcome: Progressing Goal: Ability to  manage health-related needs will improve Outcome: Progressing   Problem: Metabolic: Goal: Ability to maintain appropriate glucose levels will improve Outcome: Progressing   Problem: Nutritional: Goal: Maintenance of adequate nutrition will improve Outcome: Progressing Goal: Progress toward achieving an optimal weight will improve Outcome: Progressing   Problem: Skin Integrity: Goal: Risk for impaired skin integrity will decrease Outcome: Progressing

## 2022-10-23 NOTE — Progress Notes (Addendum)
Progress Note   Patient: [name] ZOX:[id number] DOB: 02/11/1955 DOA: 10/21/2022     2 DOS: the patient was seen and examined on 10/23/2022   Brief hospital course: Mr. Aylsworth was admitted to the hospital with the working diagnosis of acute heart failure decompensation.   67 yo male with the past medical history of coronary artery disease, hypertension, dyslipidemia, T2DM, GERD, and cocaine abuse who presented with dyspnea and edema. Reported worsening lower extremity and orthopnea for 2 weeks, more severe over the last 3 days prior to admission. On his initial physical examination his blood pressure was 109/93, HR 76, rr 24 and 02 saturation 96%, lungs with rales at bases with no wheezing or rhonchi, heart with S1 and S2 present and regular with no gallops or rubs, positive JVD, abdomen with no distention, non tender and positive lower extremity edema.   Na 134, K 3,7 Cl 102, bicarbonate 24, glucose 159, bun 32, cr 1,79  BNP 1,088 High sensitive troponin 10 and 10  Wbc 13,8 hgb 9,1 plt 229  Sars covid 19 negative   Chest radiograph with right rotation, positive cardiomegaly, bilateral hilar vascular congestion, bilateral central interstitial infiltrates with small right pleural effusion.   EKG 75 bpm, left axis deviation, qtc 553, sinus rhythm with first degree AV block, poor R R wave progression, q wave v2 to v6, with no significant ST segment or T wave changes.   Patient was placed on furosemide for diuresis with improvement in volume status.   10/13 transition to oral furosemide, possible discharge tomorrow home.     Assessment and Plan: * Acute on chronic systolic CHF (congestive heart failure) (HCC) Echocardiogram with reduced LV systolic function with EF 20 to 25%, global hypokinesis, interventricular septum is flattened in systole and diastole, RV with mild enlarged cavity and decreased systolic function, LV with entire apex akinetic,   Urine output 3,501 ml Systolic  blood pressure 104 to 113 mmHg.   Transition to oral furosemide and add SGLT 2 inh. Continue with carvedilol.  Holding on RAAS inhibition due to recovering AKI.  Limited pharmacologic options due to acutely reduced GFR (AKI), and risk of hypotension.   Acute hypoxemic respiratory failure due to acute cardiogenic pulmonary edema.  02 saturation today is 99% on room air.  Resolved respiratory failure.  Pneumonia has been ruled out  Stage 3 chronic renal impairment associated with type 2 diabetes mellitus (HCC) CKD stage 3b, hyponatremia.   Improved volume status, renal function today with serum cr at 1,61 with K at 4,1 and serum bicarbonate at 26.  Na 134   Transition to oral furosemide in am and add SGLT 2 inh.  Possible addition of ARB when renal function more stable.   Coronary artery disease No chest pain. No acute coronary syndrome.   Essential hypertension Blood pressure has been stable, continue close monitoring.  Continue carvedilol.  Possible addition of low dose ARB when renal function more stable.    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 His glucose has been controlled with fasting glucose today 171 mg/dl.   Tobacco use disorder Smoking cessation counseling.   Cocaine use No toxicology screen on this admission. Tested positive for cocaine on 2021.   OSA on CPAP C pap  Obesity class 2 with BMI at 35.1         Subjective: Patient with no chest pain, dyspnea has improved, along with edema,   Physical Exam: Vitals:  10/22/22 1100 10/22/22 1712 10/22/22 2030 10/23/22 0402  BP:  112/73 104/69 113/79  Pulse:  70 70 75  Resp:  19 20 20   Temp:  (!) 97.4 F (36.3 C) 97.6 F (36.4 C) 98.7 F (37.1 C)  TempSrc:  Oral  Oral  SpO2: 99% 100% 98% 98%  Weight:    109.5 kg  Height:       Neurology awake and alert ENT with mild pallor Cardiovascular with S1 and S2 present and regular  with no gallops, rubs or murmurs Respiratory with no rales or wheezing, no rhonchi Abdomen with no distention Trace lower extremity edema  Data Reviewed:    Family Communication: no family at the bedside   Disposition: Status is: Inpatient Remains inpatient appropriate because: heart failure, possible discharge home tomorrow   Planned Discharge Destination: Home    Author: Coralie Keens, MD 10/23/2022 11:32 AM  For on call review [email or web address].

## 2022-10-23 NOTE — Plan of Care (Signed)
  Problem: Acute Rehab PT Goals(only PT should resolve) Goal: Pt Will Go Supine/Side To Sit Flowsheets (Taken 10/23/2022 0926) Pt will go Supine/Side to Sit: Independently Goal: Patient Will Transfer Sit To/From Stand Flowsheets (Taken 10/23/2022 [phone]) Patient will transfer sit to/from stand: Independently Goal: Pt Will Ambulate Flowsheets (Taken 10/23/2022 0926) Pt will Ambulate:  50 feet  with modified independence  with rolling walker  Nelida Meuse PT, DPT Physical Therapist with [facility] [phone] office

## 2022-10-23 NOTE — TOC Progression Note (Signed)
Transition of Care [facility]) - Progression Note    Patient Details  Name: [name] MRN: [id number] Date of Birth: 01-08-1956  [facility] CM/SW Contact  Catalina Gravel, [place] Phone Number: 10/23/2022, 4:51 PM  Clinical Narrative:    Pt accepts HHPT, [facility] accepted.  CSW also spoke to daughter who confirmed pt in agreement and would like service.  CSW faxed [facility] referral to [email or web address]. DC likely Monday.   Expected Discharge Plan: Home w Home Health Services Barriers to Discharge: Continued Medical Work up  Expected Discharge Plan and Services In-house Referral: Clinical Social Work Discharge Planning Services: CM Consult Post Acute Care Choice: Home Health Living arrangements for the past 2 months: Single Family Home                                       Social Determinants of Health (SDOH) Interventions SDOH Screenings   Food Insecurity: No Food Insecurity (10/21/2022)  Housing: Low Risk  (10/21/2022)  Transportation Needs: No Transportation Needs (10/21/2022)  Utilities: Not At Risk (10/21/2022)  Tobacco Use: High Risk (10/21/2022)    Readmission Risk Interventions    10/21/2022    1:05 PM  Readmission Risk Prevention Plan  Transportation Screening Complete  HRI or Home Care Consult Complete  Social Work Consult for Recovery Care Planning/Counseling Complete  Palliative Care Screening Not Applicable  Medication Review Oceanographer) Complete

## 2022-10-24 DIAGNOSIS — I5023 Acute on chronic systolic (congestive) heart failure: Secondary | ICD-10-CM | POA: Diagnosis not present

## 2022-10-24 DIAGNOSIS — E1122 Type 2 diabetes mellitus with diabetic chronic kidney disease: Secondary | ICD-10-CM | POA: Diagnosis not present

## 2022-10-24 DIAGNOSIS — I2510 Atherosclerotic heart disease of native coronary artery without angina pectoris: Secondary | ICD-10-CM | POA: Diagnosis not present

## 2022-10-24 DIAGNOSIS — F1490 Cocaine use, unspecified, uncomplicated: Secondary | ICD-10-CM | POA: Diagnosis not present

## 2022-10-24 LAB — BASIC METABOLIC PANEL
Anion gap: 9 (ref 5–15)
BUN: 29 mg/dL — ABNORMAL HIGH (ref 8–23)
CO2: 26 mmol/L (ref 22–32)
Calcium: 7.7 mg/dL — ABNORMAL LOW (ref 8.9–10.3)
Chloride: 100 mmol/L (ref 98–111)
Creatinine, Ser: 1.78 mg/dL — ABNORMAL HIGH (ref 0.61–1.24)
GFR, Estimated: 41 mL/min — ABNORMAL LOW (ref >60)
Glucose, Bld: 190 mg/dL — ABNORMAL HIGH (ref 70–99)
Potassium: 3.7 mmol/L (ref 3.5–5.1)
Sodium: 135 mmol/L (ref 135–145)

## 2022-10-24 LAB — GLUCOSE, CAPILLARY
Glucose-Capillary: 173 mg/dL — ABNORMAL HIGH (ref 70–99)
Glucose-Capillary: 206 mg/dL — ABNORMAL HIGH (ref 70–99)
Glucose-Capillary: 122 mg/dL — ABNORMAL HIGH (ref 70–99)
Glucose-Capillary: 159 mg/dL — ABNORMAL HIGH (ref 70–99)

## 2022-10-24 LAB — MAGNESIUM: Magnesium: 1.7 mg/dL (ref 1.7–2.4)

## 2022-10-24 MED ORDER — ORAL CARE MOUTH RINSE: 15.0000 mL | OROMUCOSAL | Status: DC | PRN

## 2022-10-24 NOTE — Plan of Care (Signed)
Problem: Education: Goal: Knowledge of disease or condition will improve Outcome: Progressing Goal: Knowledge of secondary prevention will improve (MUST DOCUMENT ALL) Outcome: Progressing Goal: Knowledge of patient specific risk factors will improve [name] N/A or DELETE if not current risk factor) Outcome: Progressing   Problem: Ischemic Stroke/TIA Tissue Perfusion: Goal: Complications of ischemic stroke/TIA will be minimized Outcome: Progressing   Problem: Coping: Goal: Will verbalize positive feelings about self Outcome: Progressing Goal: Will identify appropriate support needs Outcome: Progressing   Problem: Health Behavior/Discharge Planning: Goal: Ability to manage health-related needs will improve Outcome: Progressing Goal: Goals will be collaboratively established with patient/family Outcome: Progressing   Problem: Self-Care: Goal: Ability to participate in self-care as condition permits will improve Outcome: Progressing Goal: Verbalization of feelings and concerns over difficulty with self-care will improve Outcome: Progressing Goal: Ability to communicate needs accurately will improve Outcome: Progressing   Problem: Nutrition: Goal: Risk of aspiration will decrease Outcome: Progressing Goal: Dietary intake will improve Outcome: Progressing   Problem: Education: Goal: Ability to demonstrate management of disease process will improve Outcome: Progressing Goal: Ability to verbalize understanding of medication therapies will improve Outcome: Progressing Goal: Individualized Educational Video(s) Outcome: Progressing   Problem: Activity: Goal: Capacity to carry out activities will improve Outcome: Progressing   Problem: Cardiac: Goal: Ability to achieve and maintain adequate cardiopulmonary perfusion will improve Outcome: Progressing   Problem: Activity: Goal: Ability to tolerate increased activity will improve Outcome: Progressing   Problem: Clinical  Measurements: Goal: Ability to maintain a body temperature in the normal range will improve Outcome: Progressing   Problem: Respiratory: Goal: Ability to maintain adequate ventilation will improve Outcome: Progressing Goal: Ability to maintain a clear airway will improve Outcome: Progressing   Problem: Education: Goal: Knowledge of General Education information will improve Description: Including pain rating scale, medication(s)/side effects and non-pharmacologic comfort measures Outcome: Progressing   Problem: Health Behavior/Discharge Planning: Goal: Ability to manage health-related needs will improve Outcome: Progressing   Problem: Clinical Measurements: Goal: Ability to maintain clinical measurements within normal limits will improve Outcome: Progressing Goal: Will remain free from infection Outcome: Progressing Goal: Diagnostic test results will improve Outcome: Progressing Goal: Respiratory complications will improve Outcome: Progressing Goal: Cardiovascular complication will be avoided Outcome: Progressing   Problem: Activity: Goal: Risk for activity intolerance will decrease Outcome: Progressing   Problem: Nutrition: Goal: Adequate nutrition will be maintained Outcome: Progressing   Problem: Coping: Goal: Level of anxiety will decrease Outcome: Progressing   Problem: Elimination: Goal: Will not experience complications related to bowel motility Outcome: Progressing Goal: Will not experience complications related to urinary retention Outcome: Progressing   Problem: Pain Managment: Goal: General experience of comfort will improve Outcome: Progressing   Problem: Safety: Goal: Ability to remain free from injury will improve Outcome: Progressing   Problem: Skin Integrity: Goal: Risk for impaired skin integrity will decrease Outcome: Progressing   Problem: Education: Goal: Ability to describe self-care measures that may prevent or decrease complications  (Diabetes Survival Skills Education) will improve Outcome: Progressing Goal: Individualized Educational Video(s) Outcome: Progressing   Problem: Coping: Goal: Ability to adjust to condition or change in health will improve Outcome: Progressing   Problem: Fluid Volume: Goal: Ability to maintain a balanced intake and output will improve Outcome: Progressing   Problem: Health Behavior/Discharge Planning: Goal: Ability to identify and utilize available resources and services will improve Outcome: Progressing Goal: Ability to manage health-related needs will improve Outcome: Progressing   Problem: Metabolic: Goal: Ability to maintain appropriate glucose levels will improve  Outcome: Progressing   Problem: Nutritional: Goal: Maintenance of adequate nutrition will improve Outcome: Progressing Goal: Progress toward achieving an optimal weight will improve Outcome: Progressing   Problem: Skin Integrity: Goal: Risk for impaired skin integrity will decrease Outcome: Progressing

## 2022-10-24 NOTE — Progress Notes (Signed)
Progress Note   Patient: [name]:[id number] DOB: December 14, 1955 DOA: 10/21/2022     3 DOS: the patient was seen and examined on 10/24/2022   Brief hospital admission: Mr. Waisner was admitted to the hospital with the working diagnosis of acute heart failure decompensation.    67 yo male with the past medical history of coronary artery disease, hypertension, dyslipidemia, T2DM, GERD, and cocaine abuse who presented with dyspnea and edema. Reported worsening lower extremity and orthopnea for 2 weeks, more severe over the last 3 days prior to admission. On his initial physical examination his blood pressure was 109/93, HR 76, rr 24 and 02 saturation 96%, lungs with rales at bases with no wheezing or rhonchi, heart with S1 and S2 present and regular with no gallops or rubs, positive JVD, abdomen with no distention, non tender and positive lower extremity edema.    Na 134, K 3,7 Cl 102, bicarbonate 24, glucose 159, bun 32, cr 1,79  BNP 1,088 High sensitive troponin 10 and 10  Wbc 13,8 hgb 9,1 plt 229  Sars covid 19 negative    Chest radiograph with right rotation, positive cardiomegaly, bilateral hilar vascular congestion, bilateral central interstitial infiltrates with small right pleural effusion.    EKG 75 bpm, left axis deviation, qtc 553, sinus rhythm with first degree AV block, poor R R wave progression, q wave v2 to v6, with no significant ST segment or T wave changes.    Patient was placed on furosemide for diuresis with improvement in volume status.    Assessment and Plan: * Acute on chronic systolic CHF (congestive heart failure) (HCC) Echocardiogram with reduced LV systolic function with EF 20 to 25%, global hypokinesis, interventricular septum is flattened in systole and diastole, RV with mild enlarged cavity and decreased systolic function, LV with entire apex akinetic,   -Patient reporting good urine output -Diuretics transitioned to oral route -Continue the use of  carvedilol and Jardiance -Continue to follow urine output and increase physical activity -Currently no requiring oxygen for mentation -Holding on RAAS inhibition due to recovering acute kidney injury and chronic renal failure. -Low-sodium diet, daily weights, strict I's and O's and medication compliance discussed with patient.  Stage 3 chronic renal impairment associated with type 2 diabetes mellitus (HCC) -CKD stage 3b, hyponatremia at time of admission.  -Electrolytes has improved after stabilizing volume status. -Continue close monitoring patient renal function -Continue diuresis as mentioned above -Patient advised to maintain adequate oral hydration -Low-sodium diet discussed with patient. -Continue holding/minimizing nephrotoxic agents.  Coronary artery disease -No chest pain. No acute coronary syndrome.  -Continue statin and carvedilol  Essential hypertension -continue current antihypertensive agents -heart healthy/low sodium diet discussed with patient.  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  Tobacco use disorder -cessation counseling provided -Continue nicotine patch.   Cocaine use Cessation counseling provided.  Obesity/OSA on CPAP -Continue CPAP nightly -Body mass index is 36.37 kg/m. -Low-calorie diet, portion control and increase physical activity discussed with patient.   Subjective:    No chest pain, no nausea, no vomiting.  Reports improvement in her breathing and at the moment no requiring oxygen supplementation.  Patient expressed feeling short winded with activity and noticing good urine output.  Physical Exam: Vitals:   10/23/22 2058 10/24/22 0414 10/24/22 0500 10/24/22 1251  BP: (!) 90/51 106/63  104/68  Pulse: 77 78  73  Resp: 20   20  Temp: (!) 97.4 F (36.3 C) 98.6 F (37 C)  (!)  97.5 F (36.4 C)  TempSrc:  Oral    SpO2: 98%   92%  Weight:   111.7 kg   Height:       General exam: Alert, awake,  oriented x 3; reports no chest pain, no requiring oxygen supplementation and experiencing mild short winded sensation.  Still expressing good urine output.  No nausea or vomiting. Respiratory system: Improved air movement bilaterally; decreased breath sounds at the bases.  No using accessory muscle. Cardiovascular system: Rate controlled, no rubs, no gallops, no JVD. Gastrointestinal system: Abdomen is obese, nondistended, soft and nontender. No organomegaly or masses felt. Normal bowel sounds heard. Central nervous system: No focal neurological deficits. Extremities: No cyanosis or clubbing; pedal edema appreciated bilaterally. Skin: No petechiae. Psychiatry: Judgement and insight appear normal. Mood & affect appropriate.   Data Reviewed: Magnesium: 1.7 Basic metabolic panel:Sodium 135, potassium 3.7, chloride 100, bicarb 26, BUN 29, creatinine 1.78 and GFR 41.   Family Communication: no family at the bedside   Disposition: Status is: Inpatient Remains inpatient appropriate because: heart failure, possible discharge home tomorrow   Planned Discharge Destination: Home    Author: Vassie Loll, MD 10/24/2022 5:22 PM  For on call review [email or web address].

## 2022-10-24 NOTE — Plan of Care (Signed)
Problem: Education: Goal: Knowledge of disease or condition will improve Outcome: Progressing Goal: Knowledge of secondary prevention will improve (MUST DOCUMENT ALL) Outcome: Progressing Goal: Knowledge of patient specific risk factors will improve [name] N/A or DELETE if not current risk factor) Outcome: Progressing   Problem: Ischemic Stroke/TIA Tissue Perfusion: Goal: Complications of ischemic stroke/TIA will be minimized Outcome: Progressing   Problem: Coping: Goal: Will verbalize positive feelings about self Outcome: Progressing Goal: Will identify appropriate support needs Outcome: Progressing   Problem: Health Behavior/Discharge Planning: Goal: Ability to manage health-related needs will improve Outcome: Progressing Goal: Goals will be collaboratively established with patient/family Outcome: Progressing   Problem: Self-Care: Goal: Ability to participate in self-care as condition permits will improve Outcome: Progressing Goal: Verbalization of feelings and concerns over difficulty with self-care will improve Outcome: Progressing Goal: Ability to communicate needs accurately will improve Outcome: Progressing   Problem: Nutrition: Goal: Risk of aspiration will decrease Outcome: Progressing Goal: Dietary intake will improve Outcome: Progressing   Problem: Education: Goal: Ability to demonstrate management of disease process will improve Outcome: Progressing Goal: Ability to verbalize understanding of medication therapies will improve Outcome: Progressing Goal: Individualized Educational Video(s) Outcome: Progressing   Problem: Activity: Goal: Capacity to carry out activities will improve Outcome: Progressing   Problem: Cardiac: Goal: Ability to achieve and maintain adequate cardiopulmonary perfusion will improve Outcome: Progressing   Problem: Activity: Goal: Ability to tolerate increased activity will improve Outcome: Progressing   Problem: Clinical  Measurements: Goal: Ability to maintain a body temperature in the normal range will improve Outcome: Progressing   Problem: Respiratory: Goal: Ability to maintain adequate ventilation will improve Outcome: Progressing Goal: Ability to maintain a clear airway will improve Outcome: Progressing   Problem: Education: Goal: Knowledge of General Education information will improve Description: Including pain rating scale, medication(s)/side effects and non-pharmacologic comfort measures Outcome: Progressing   Problem: Health Behavior/Discharge Planning: Goal: Ability to manage health-related needs will improve Outcome: Progressing   Problem: Clinical Measurements: Goal: Ability to maintain clinical measurements within normal limits will improve Outcome: Progressing Goal: Will remain free from infection Outcome: Progressing Goal: Diagnostic test results will improve Outcome: Progressing Goal: Respiratory complications will improve Outcome: Progressing Goal: Cardiovascular complication will be avoided Outcome: Progressing   Problem: Activity: Goal: Risk for activity intolerance will decrease Outcome: Progressing   Problem: Nutrition: Goal: Adequate nutrition will be maintained Outcome: Progressing   Problem: Coping: Goal: Level of anxiety will decrease Outcome: Progressing   Problem: Elimination: Goal: Will not experience complications related to bowel motility Outcome: Progressing Goal: Will not experience complications related to urinary retention Outcome: Progressing   Problem: Pain Managment: Goal: General experience of comfort will improve Outcome: Progressing   Problem: Safety: Goal: Ability to remain free from injury will improve Outcome: Progressing   Problem: Skin Integrity: Goal: Risk for impaired skin integrity will decrease Outcome: Progressing   Problem: Education: Goal: Ability to describe self-care measures that may prevent or decrease complications  (Diabetes Survival Skills Education) will improve Outcome: Progressing Goal: Individualized Educational Video(s) Outcome: Progressing   Problem: Coping: Goal: Ability to adjust to condition or change in health will improve Outcome: Progressing   Problem: Fluid Volume: Goal: Ability to maintain a balanced intake and output will improve Outcome: Progressing   Problem: Health Behavior/Discharge Planning: Goal: Ability to identify and utilize available resources and services will improve Outcome: Progressing Goal: Ability to manage health-related needs will improve Outcome: Progressing   Problem: Metabolic: Goal: Ability to maintain appropriate glucose levels will improve  Outcome: Progressing   Problem: Nutritional: Goal: Maintenance of adequate nutrition will improve Outcome: Progressing Goal: Progress toward achieving an optimal weight will improve Outcome: Progressing   Problem: Skin Integrity: Goal: Risk for impaired skin integrity will decrease Outcome: Progressing

## 2022-10-24 NOTE — Progress Notes (Signed)
Mobility Specialist Progress Note:    10/24/22 1050  Mobility  Activity Ambulated with assistance in hallway  Level of Assistance Modified independent, requires aide device or extra time  Assistive Device None;Other (Comment) (Walls. Handrails)  Distance Ambulated (ft) 0 ft  Range of Motion/Exercises Active;All extremities  Activity Response Tolerated well  Mobility Referral Yes  $Mobility charge 1 Mobility  Mobility Specialist Start Time (ACUTE ONLY) 1050  Mobility Specialist Stop Time (ACUTE ONLY) 1100  Mobility Specialist Time Calculation (min) (ACUTE ONLY) 10 min   Pt received in bed, agreeable to mobility. ModI to stand and ambulate with no AD. Pt used handrails and walls for assistance. Tolerated well, asx throughout. Returned pt to room, all needs met.   [name] Mobility Specialist Please contact via Special educational needs teacher or  Rehab office at [phone]

## 2022-10-24 NOTE — Progress Notes (Signed)
Asked patient if he was going to wear a CPAP tonight.  Patient stated he doesn't wear them and he refused.

## 2022-10-25 DIAGNOSIS — F1490 Cocaine use, unspecified, uncomplicated: Secondary | ICD-10-CM | POA: Diagnosis not present

## 2022-10-25 DIAGNOSIS — I5023 Acute on chronic systolic (congestive) heart failure: Secondary | ICD-10-CM | POA: Diagnosis not present

## 2022-10-25 DIAGNOSIS — F17200 Nicotine dependence, unspecified, uncomplicated: Secondary | ICD-10-CM | POA: Diagnosis not present

## 2022-10-25 DIAGNOSIS — G4733 Obstructive sleep apnea (adult) (pediatric): Secondary | ICD-10-CM | POA: Diagnosis not present

## 2022-10-25 LAB — GLUCOSE, CAPILLARY
Glucose-Capillary: 126 mg/dL — ABNORMAL HIGH (ref 70–99)
Glucose-Capillary: 177 mg/dL — ABNORMAL HIGH (ref 70–99)

## 2022-10-25 MED ORDER — GABAPENTIN 300 MG PO CAPS: 300.0000 mg | ORAL_CAPSULE | Freq: Three times a day (TID) | ORAL | 1 refills | Status: DC

## 2022-10-25 MED ORDER — NICOTINE 21 MG/24HR TD PT24: 21.0000 mg | MEDICATED_PATCH | Freq: Every day | TRANSDERMAL | 0 refills | Status: DC

## 2022-10-25 MED ORDER — PANTOPRAZOLE SODIUM 40 MG PO TBEC: 40.0000 mg | DELAYED_RELEASE_TABLET | Freq: Every day | ORAL | 2 refills | Status: DC

## 2022-10-25 MED ORDER — CARVEDILOL 3.125 MG PO TABS: 3.1250 mg | ORAL_TABLET | Freq: Two times a day (BID) | ORAL | 2 refills | Status: DC

## 2022-10-25 MED ORDER — ATORVASTATIN CALCIUM 40 MG PO TABS: 40.0000 mg | ORAL_TABLET | Freq: Every day | ORAL | 2 refills | Status: DC

## 2022-10-25 MED ORDER — EMPAGLIFLOZIN 10 MG PO TABS: 10.0000 mg | ORAL_TABLET | Freq: Every day | ORAL | 2 refills | Status: DC

## 2022-10-25 MED ORDER — FUROSEMIDE 40 MG PO TABS: 40.0000 mg | ORAL_TABLET | Freq: Every day | ORAL | 2 refills | Status: DC

## 2022-10-25 NOTE — Plan of Care (Signed)
Problem: Education: Goal: Knowledge of disease or condition will improve Outcome: Adequate for Discharge Goal: Knowledge of secondary prevention will improve (MUST DOCUMENT ALL) Outcome: Adequate for Discharge Goal: Knowledge of patient specific risk factors will improve [name] N/A or DELETE if not current risk factor) Outcome: Adequate for Discharge   Problem: Ischemic Stroke/TIA Tissue Perfusion: Goal: Complications of ischemic stroke/TIA will be minimized Outcome: Adequate for Discharge   Problem: Coping: Goal: Will verbalize positive feelings about self Outcome: Adequate for Discharge Goal: Will identify appropriate support needs Outcome: Adequate for Discharge   Problem: Health Behavior/Discharge Planning: Goal: Ability to manage health-related needs will improve Outcome: Adequate for Discharge Goal: Goals will be collaboratively established with patient/family Outcome: Adequate for Discharge   Problem: Self-Care: Goal: Ability to participate in self-care as condition permits will improve Outcome: Adequate for Discharge Goal: Verbalization of feelings and concerns over difficulty with self-care will improve Outcome: Adequate for Discharge Goal: Ability to communicate needs accurately will improve Outcome: Adequate for Discharge   Problem: Nutrition: Goal: Risk of aspiration will decrease Outcome: Adequate for Discharge Goal: Dietary intake will improve Outcome: Adequate for Discharge   Problem: Education: Goal: Ability to demonstrate management of disease process will improve Outcome: Adequate for Discharge Goal: Ability to verbalize understanding of medication therapies will improve Outcome: Adequate for Discharge Goal: Individualized Educational Video(s) Outcome: Adequate for Discharge   Problem: Activity: Goal: Capacity to carry out activities will improve Outcome: Adequate for Discharge   Problem: Cardiac: Goal: Ability to achieve and maintain adequate  cardiopulmonary perfusion will improve Outcome: Adequate for Discharge   Problem: Activity: Goal: Ability to tolerate increased activity will improve Outcome: Adequate for Discharge   Problem: Clinical Measurements: Goal: Ability to maintain a body temperature in the normal range will improve Outcome: Adequate for Discharge   Problem: Respiratory: Goal: Ability to maintain adequate ventilation will improve Outcome: Adequate for Discharge Goal: Ability to maintain a clear airway will improve Outcome: Adequate for Discharge   Problem: Education: Goal: Knowledge of General Education information will improve Description: Including pain rating scale, medication(s)/side effects and non-pharmacologic comfort measures Outcome: Adequate for Discharge   Problem: Health Behavior/Discharge Planning: Goal: Ability to manage health-related needs will improve Outcome: Adequate for Discharge   Problem: Clinical Measurements: Goal: Ability to maintain clinical measurements within normal limits will improve Outcome: Adequate for Discharge Goal: Will remain free from infection Outcome: Adequate for Discharge Goal: Diagnostic test results will improve Outcome: Adequate for Discharge Goal: Respiratory complications will improve Outcome: Adequate for Discharge Goal: Cardiovascular complication will be avoided Outcome: Adequate for Discharge   Problem: Activity: Goal: Risk for activity intolerance will decrease Outcome: Adequate for Discharge   Problem: Nutrition: Goal: Adequate nutrition will be maintained Outcome: Adequate for Discharge   Problem: Coping: Goal: Level of anxiety will decrease Outcome: Adequate for Discharge   Problem: Elimination: Goal: Will not experience complications related to bowel motility Outcome: Adequate for Discharge Goal: Will not experience complications related to urinary retention Outcome: Adequate for Discharge   Problem: Pain Managment: Goal:  General experience of comfort will improve Outcome: Adequate for Discharge   Problem: Safety: Goal: Ability to remain free from injury will improve Outcome: Adequate for Discharge   Problem: Skin Integrity: Goal: Risk for impaired skin integrity will decrease Outcome: Adequate for Discharge   Problem: Education: Goal: Ability to describe self-care measures that may prevent or decrease complications (Diabetes Survival Skills Education) will improve Outcome: Adequate for Discharge Goal: Individualized Educational Video(s) Outcome: Adequate for Discharge  Problem: Coping: Goal: Ability to adjust to condition or change in health will improve Outcome: Adequate for Discharge   Problem: Fluid Volume: Goal: Ability to maintain a balanced intake and output will improve Outcome: Adequate for Discharge   Problem: Health Behavior/Discharge Planning: Goal: Ability to identify and utilize available resources and services will improve Outcome: Adequate for Discharge Goal: Ability to manage health-related needs will improve Outcome: Adequate for Discharge   Problem: Metabolic: Goal: Ability to maintain appropriate glucose levels will improve Outcome: Adequate for Discharge   Problem: Nutritional: Goal: Maintenance of adequate nutrition will improve Outcome: Adequate for Discharge Goal: Progress toward achieving an optimal weight will improve Outcome: Adequate for Discharge   Problem: Skin Integrity: Goal: Risk for impaired skin integrity will decrease Outcome: Adequate for Discharge

## 2022-10-25 NOTE — Plan of Care (Signed)
Problem: Education: Goal: Knowledge of disease or condition will improve Outcome: Progressing Goal: Knowledge of secondary prevention will improve (MUST DOCUMENT ALL) Outcome: Progressing Goal: Knowledge of patient specific risk factors will improve [name] N/A or DELETE if not current risk factor) Outcome: Progressing   Problem: Ischemic Stroke/TIA Tissue Perfusion: Goal: Complications of ischemic stroke/TIA will be minimized Outcome: Progressing   Problem: Coping: Goal: Will verbalize positive feelings about self Outcome: Progressing Goal: Will identify appropriate support needs Outcome: Progressing   Problem: Health Behavior/Discharge Planning: Goal: Ability to manage health-related needs will improve Outcome: Progressing Goal: Goals will be collaboratively established with patient/family Outcome: Progressing   Problem: Self-Care: Goal: Ability to participate in self-care as condition permits will improve Outcome: Progressing Goal: Verbalization of feelings and concerns over difficulty with self-care will improve Outcome: Progressing Goal: Ability to communicate needs accurately will improve Outcome: Progressing   Problem: Nutrition: Goal: Risk of aspiration will decrease Outcome: Progressing Goal: Dietary intake will improve Outcome: Progressing   Problem: Education: Goal: Ability to demonstrate management of disease process will improve Outcome: Progressing Goal: Ability to verbalize understanding of medication therapies will improve Outcome: Progressing Goal: Individualized Educational Video(s) Outcome: Progressing   Problem: Activity: Goal: Capacity to carry out activities will improve Outcome: Progressing   Problem: Cardiac: Goal: Ability to achieve and maintain adequate cardiopulmonary perfusion will improve Outcome: Progressing   Problem: Activity: Goal: Ability to tolerate increased activity will improve Outcome: Progressing   Problem: Clinical  Measurements: Goal: Ability to maintain a body temperature in the normal range will improve Outcome: Progressing   Problem: Respiratory: Goal: Ability to maintain adequate ventilation will improve Outcome: Progressing Goal: Ability to maintain a clear airway will improve Outcome: Progressing   Problem: Education: Goal: Knowledge of General Education information will improve Description: Including pain rating scale, medication(s)/side effects and non-pharmacologic comfort measures Outcome: Progressing   Problem: Health Behavior/Discharge Planning: Goal: Ability to manage health-related needs will improve Outcome: Progressing   Problem: Clinical Measurements: Goal: Ability to maintain clinical measurements within normal limits will improve Outcome: Progressing Goal: Will remain free from infection Outcome: Progressing Goal: Diagnostic test results will improve Outcome: Progressing Goal: Respiratory complications will improve Outcome: Progressing Goal: Cardiovascular complication will be avoided Outcome: Progressing   Problem: Activity: Goal: Risk for activity intolerance will decrease Outcome: Progressing   Problem: Nutrition: Goal: Adequate nutrition will be maintained Outcome: Progressing   Problem: Coping: Goal: Level of anxiety will decrease Outcome: Progressing   Problem: Elimination: Goal: Will not experience complications related to bowel motility Outcome: Progressing Goal: Will not experience complications related to urinary retention Outcome: Progressing   Problem: Pain Managment: Goal: General experience of comfort will improve Outcome: Progressing   Problem: Safety: Goal: Ability to remain free from injury will improve Outcome: Progressing   Problem: Skin Integrity: Goal: Risk for impaired skin integrity will decrease Outcome: Progressing   Problem: Education: Goal: Ability to describe self-care measures that may prevent or decrease complications  (Diabetes Survival Skills Education) will improve Outcome: Progressing Goal: Individualized Educational Video(s) Outcome: Progressing   Problem: Coping: Goal: Ability to adjust to condition or change in health will improve Outcome: Progressing   Problem: Fluid Volume: Goal: Ability to maintain a balanced intake and output will improve Outcome: Progressing   Problem: Health Behavior/Discharge Planning: Goal: Ability to identify and utilize available resources and services will improve Outcome: Progressing Goal: Ability to manage health-related needs will improve Outcome: Progressing   Problem: Metabolic: Goal: Ability to maintain appropriate glucose levels will improve  Outcome: Progressing   Problem: Nutritional: Goal: Maintenance of adequate nutrition will improve Outcome: Progressing Goal: Progress toward achieving an optimal weight will improve Outcome: Progressing   Problem: Skin Integrity: Goal: Risk for impaired skin integrity will decrease Outcome: Progressing

## 2022-10-25 NOTE — Discharge Summary (Signed)
bilaterally. Skin: No petechiae. Psychiatry: Judgement and insight appear normal. Mood & affect appropriate.   Condition at discharge: Stable and improved.  The results of significant diagnostics from this hospitalization (including imaging, microbiology, ancillary and laboratory) are listed below for reference.   Imaging Studies: ECHOCARDIOGRAM COMPLETE  Result Date: 10/21/2022    ECHOCARDIOGRAM REPORT   Patient Name:   [name] Date of Exam:  10/21/2022 Medical Rec #:  [id number]        Height:       69.0 in Accession #:    [id number]       Weight:       260.0 lb Date of Birth:  1955-08-12        BSA:          2.310 m Patient Age:    67 years         BP:           104/78 mmHg Patient Gender: M                HR:           74 bpm. [facility] Procedure: 2D Echo, Cardiac Doppler and Color Doppler Indications:    CHF- Acute Systolic I50.21  History:        Patient has prior history of Echocardiogram examinations, most                 recent 11/07/2021. CHF and Cardiomyopathy, Angina and Previous                 Myocardial Infarction, Stroke and PAD, Arrythmias:Tachycardia,                 Signs/Symptoms:Chest Pain; Risk Factors:Hypertension, Sleep                 Apnea, Diabetes and Dyslipidemia. CKD, stage 3.  Sonographer:    Lucendia Herrlich RCS Referring Phys: [phone] Shontae Rosiles  Sonographer Comments: Image acquisition challenging due to uncooperative patient. IMPRESSIONS  1. Left ventricular ejection fraction, by estimation, is 20 to 25%. The left ventricle has severely decreased function. The left ventricle demonstrates global hypokinesis. Left ventricular diastolic parameters are consistent with Grade II diastolic dysfunction (pseudonormalization). Elevated left atrial pressure. There is the interventricular septum is flattened in systole and diastole, consistent with right ventricular pressure and volume overload.  2. RV not well viusalized. Grossly appears mildly enlarged with mildly decreased function. Right ventricular systolic function was not well visualized. The right ventricular size is not well visualized. Tricuspid regurgitation signal is inadequate for assessing PA pressure.  3. Left atrial size was mildly dilated.  4. The mitral valve is abnormal. Mild mitral valve regurgitation. No evidence of mitral stenosis.  5. The tricuspid valve is abnormal.  6. The aortic valve is tricuspid. There is mild calcification of the aortic  valve. There is mild thickening of the aortic valve. Aortic valve regurgitation is not visualized. No aortic stenosis is present.  7. The pulmonic valve was abnormal. FINDINGS  Left Ventricle: Left ventricular ejection fraction, by estimation, is 20 to 25%. The left ventricle has severely decreased function. The left ventricle demonstrates global hypokinesis. The left ventricular internal cavity size was normal in size. There is no left ventricular hypertrophy. The interventricular septum is flattened in systole and diastole, consistent with right ventricular pressure and volume overload. Left ventricular diastolic parameters are consistent with Grade II diastolic dysfunction  (pseudonormalization). Elevated left atrial pressure.  LV Wall  bilaterally. Skin: No petechiae. Psychiatry: Judgement and insight appear normal. Mood & affect appropriate.   Condition at discharge: Stable and improved.  The results of significant diagnostics from this hospitalization (including imaging, microbiology, ancillary and laboratory) are listed below for reference.   Imaging Studies: ECHOCARDIOGRAM COMPLETE  Result Date: 10/21/2022    ECHOCARDIOGRAM REPORT   Patient Name:   [name] Date of Exam:  10/21/2022 Medical Rec #:  [id number]        Height:       69.0 in Accession #:    [id number]       Weight:       260.0 lb Date of Birth:  1955-08-12        BSA:          2.310 m Patient Age:    67 years         BP:           104/78 mmHg Patient Gender: M                HR:           74 bpm. [facility] Procedure: 2D Echo, Cardiac Doppler and Color Doppler Indications:    CHF- Acute Systolic I50.21  History:        Patient has prior history of Echocardiogram examinations, most                 recent 11/07/2021. CHF and Cardiomyopathy, Angina and Previous                 Myocardial Infarction, Stroke and PAD, Arrythmias:Tachycardia,                 Signs/Symptoms:Chest Pain; Risk Factors:Hypertension, Sleep                 Apnea, Diabetes and Dyslipidemia. CKD, stage 3.  Sonographer:    Lucendia Herrlich RCS Referring Phys: [phone] Shontae Rosiles  Sonographer Comments: Image acquisition challenging due to uncooperative patient. IMPRESSIONS  1. Left ventricular ejection fraction, by estimation, is 20 to 25%. The left ventricle has severely decreased function. The left ventricle demonstrates global hypokinesis. Left ventricular diastolic parameters are consistent with Grade II diastolic dysfunction (pseudonormalization). Elevated left atrial pressure. There is the interventricular septum is flattened in systole and diastole, consistent with right ventricular pressure and volume overload.  2. RV not well viusalized. Grossly appears mildly enlarged with mildly decreased function. Right ventricular systolic function was not well visualized. The right ventricular size is not well visualized. Tricuspid regurgitation signal is inadequate for assessing PA pressure.  3. Left atrial size was mildly dilated.  4. The mitral valve is abnormal. Mild mitral valve regurgitation. No evidence of mitral stenosis.  5. The tricuspid valve is abnormal.  6. The aortic valve is tricuspid. There is mild calcification of the aortic  valve. There is mild thickening of the aortic valve. Aortic valve regurgitation is not visualized. No aortic stenosis is present.  7. The pulmonic valve was abnormal. FINDINGS  Left Ventricle: Left ventricular ejection fraction, by estimation, is 20 to 25%. The left ventricle has severely decreased function. The left ventricle demonstrates global hypokinesis. The left ventricular internal cavity size was normal in size. There is no left ventricular hypertrophy. The interventricular septum is flattened in systole and diastole, consistent with right ventricular pressure and volume overload. Left ventricular diastolic parameters are consistent with Grade II diastolic dysfunction  (pseudonormalization). Elevated left atrial pressure.  LV Wall  bilaterally. Skin: No petechiae. Psychiatry: Judgement and insight appear normal. Mood & affect appropriate.   Condition at discharge: Stable and improved.  The results of significant diagnostics from this hospitalization (including imaging, microbiology, ancillary and laboratory) are listed below for reference.   Imaging Studies: ECHOCARDIOGRAM COMPLETE  Result Date: 10/21/2022    ECHOCARDIOGRAM REPORT   Patient Name:   [name] Date of Exam:  10/21/2022 Medical Rec #:  [id number]        Height:       69.0 in Accession #:    [id number]       Weight:       260.0 lb Date of Birth:  1955-08-12        BSA:          2.310 m Patient Age:    67 years         BP:           104/78 mmHg Patient Gender: M                HR:           74 bpm. [facility] Procedure: 2D Echo, Cardiac Doppler and Color Doppler Indications:    CHF- Acute Systolic I50.21  History:        Patient has prior history of Echocardiogram examinations, most                 recent 11/07/2021. CHF and Cardiomyopathy, Angina and Previous                 Myocardial Infarction, Stroke and PAD, Arrythmias:Tachycardia,                 Signs/Symptoms:Chest Pain; Risk Factors:Hypertension, Sleep                 Apnea, Diabetes and Dyslipidemia. CKD, stage 3.  Sonographer:    Lucendia Herrlich RCS Referring Phys: [phone] Shontae Rosiles  Sonographer Comments: Image acquisition challenging due to uncooperative patient. IMPRESSIONS  1. Left ventricular ejection fraction, by estimation, is 20 to 25%. The left ventricle has severely decreased function. The left ventricle demonstrates global hypokinesis. Left ventricular diastolic parameters are consistent with Grade II diastolic dysfunction (pseudonormalization). Elevated left atrial pressure. There is the interventricular septum is flattened in systole and diastole, consistent with right ventricular pressure and volume overload.  2. RV not well viusalized. Grossly appears mildly enlarged with mildly decreased function. Right ventricular systolic function was not well visualized. The right ventricular size is not well visualized. Tricuspid regurgitation signal is inadequate for assessing PA pressure.  3. Left atrial size was mildly dilated.  4. The mitral valve is abnormal. Mild mitral valve regurgitation. No evidence of mitral stenosis.  5. The tricuspid valve is abnormal.  6. The aortic valve is tricuspid. There is mild calcification of the aortic  valve. There is mild thickening of the aortic valve. Aortic valve regurgitation is not visualized. No aortic stenosis is present.  7. The pulmonic valve was abnormal. FINDINGS  Left Ventricle: Left ventricular ejection fraction, by estimation, is 20 to 25%. The left ventricle has severely decreased function. The left ventricle demonstrates global hypokinesis. The left ventricular internal cavity size was normal in size. There is no left ventricular hypertrophy. The interventricular septum is flattened in systole and diastole, consistent with right ventricular pressure and volume overload. Left ventricular diastolic parameters are consistent with Grade II diastolic dysfunction  (pseudonormalization). Elevated left atrial pressure.  LV Wall  Physician Discharge Summary   Patient: [name] MRN: [id number] DOB: 08-11-1955  Admit date:     10/21/2022  Discharge date: 10/25/22  Discharge Physician: Vassie Loll   PCP: Benetta Spar, MD   Recommendations at discharge:  Repeat basic metabolic panel and follow electrolytes and renal function and stability. Reassess blood pressure and adjust antihypertensive treatment as needed. Close monitoring of patient's CBGs with further adjustment to hypoglycemia regimen as required Continue assisting patient with tobacco cessation, weight loss management and recreational drug cessation.  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 Schinke was admitted to the hospital with the working diagnosis of acute heart failure decompensation.    67 yo male with the past medical history of coronary artery disease, hypertension, dyslipidemia, T2DM, GERD, and cocaine abuse who presented with dyspnea and edema. Reported worsening lower extremity and orthopnea for 2 weeks, more severe over the last 3 days prior to admission. On his initial physical examination his blood pressure was 109/93, HR 76, rr 24 and 02 saturation 96%, lungs with rales at bases with no wheezing or rhonchi, heart with S1 and S2 present and regular with no gallops or rubs, positive JVD, abdomen with no distention, non tender and positive lower extremity edema.    Na 134, K 3,7 Cl 102, bicarbonate 24, glucose 159, bun 32, cr 1,79  BNP 1,088 High sensitive troponin 10 and 10  Wbc 13,8 hgb 9,1 plt 229  Sars covid 19 negative    Chest radiograph with right rotation, positive  cardiomegaly, bilateral hilar vascular congestion, bilateral central interstitial infiltrates with small right pleural effusion.    EKG 75 bpm, left axis deviation, qtc 553, sinus rhythm with first degree AV block, poor R R wave progression, q wave v2 to v6, with no significant ST segment or T wave changes.    Patient was placed on furosemide for diuresis with improvement in volume status.   Assessment and Plan: * Acute on chronic systolic CHF (congestive heart failure) (HCC) Echocardiogram with reduced LV systolic function with EF 20 to 25%, global hypokinesis, interventricular septum is flattened in systole and diastole, RV with mild enlarged cavity and decreased systolic function, LV with entire apex akinetic,    -Patient reporting good urine output -Diuretics transitioned to oral route -Continue the use of carvedilol and Jardiance -Continue to follow urine output and increase physical activity -Currently no requiring oxygen for mentation -Holding on RAAS inhibition due to recovering acute kidney injury and chronic renal failure. -Low-sodium diet, daily weights, strict I's and O's and medication compliance discussed with patient.   Stage 3 chronic renal impairment associated with type 2 diabetes mellitus (HCC) -CKD stage 3b, hyponatremia at time of admission.  -Electrolytes has improved after stabilizing volume status. -Continue close monitoring patient renal function -Continue diuresis as mentioned above -Patient advised to maintain adequate oral hydration -Low-sodium diet discussed with patient. -Continue holding/minimizing nephrotoxic agents.   Coronary artery disease -No chest pain. No acute coronary syndrome.  -Continue statin and carvedilol   Essential hypertension -continue current antihypertensive agents -heart healthy/low sodium diet discussed with patient.   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 Reassess blood pressure and adjust antihypertensive treatment as needed. Close monitoring of patient's CBGs with further adjustment to hypoglycemia regimen as required Continue assisting patient with tobacco cessation, weight loss management and recreational drug cessation.  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 Schinke was admitted to the hospital with the working diagnosis of acute heart failure decompensation.    67 yo male with the past medical history of coronary artery disease, hypertension, dyslipidemia, T2DM, GERD, and cocaine abuse who presented with dyspnea and edema. Reported worsening lower extremity and orthopnea for 2 weeks, more severe over the last 3 days prior to admission. On his initial physical examination his blood pressure was 109/93, HR 76, rr 24 and 02 saturation 96%, lungs with rales at bases with no wheezing or rhonchi, heart with S1 and S2 present and regular with no gallops or rubs, positive JVD, abdomen with no distention, non tender and positive lower extremity edema.    Na 134, K 3,7 Cl 102, bicarbonate 24, glucose 159, bun 32, cr 1,79  BNP 1,088 High sensitive troponin 10 and 10  Wbc 13,8 hgb 9,1 plt 229  Sars covid 19 negative    Chest radiograph with right rotation, positive  cardiomegaly, bilateral hilar vascular congestion, bilateral central interstitial infiltrates with small right pleural effusion.    EKG 75 bpm, left axis deviation, qtc 553, sinus rhythm with first degree AV block, poor R R wave progression, q wave v2 to v6, with no significant ST segment or T wave changes.    Patient was placed on furosemide for diuresis with improvement in volume status.   Assessment and Plan: * Acute on chronic systolic CHF (congestive heart failure) (HCC) Echocardiogram with reduced LV systolic function with EF 20 to 25%, global hypokinesis, interventricular septum is flattened in systole and diastole, RV with mild enlarged cavity and decreased systolic function, LV with entire apex akinetic,    -Patient reporting good urine output -Diuretics transitioned to oral route -Continue the use of carvedilol and Jardiance -Continue to follow urine output and increase physical activity -Currently no requiring oxygen for mentation -Holding on RAAS inhibition due to recovering acute kidney injury and chronic renal failure. -Low-sodium diet, daily weights, strict I's and O's and medication compliance discussed with patient.   Stage 3 chronic renal impairment associated with type 2 diabetes mellitus (HCC) -CKD stage 3b, hyponatremia at time of admission.  -Electrolytes has improved after stabilizing volume status. -Continue close monitoring patient renal function -Continue diuresis as mentioned above -Patient advised to maintain adequate oral hydration -Low-sodium diet discussed with patient. -Continue holding/minimizing nephrotoxic agents.   Coronary artery disease -No chest pain. No acute coronary syndrome.  -Continue statin and carvedilol   Essential hypertension -continue current antihypertensive agents -heart healthy/low sodium diet discussed with patient.   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 Reassess blood pressure and adjust antihypertensive treatment as needed. Close monitoring of patient's CBGs with further adjustment to hypoglycemia regimen as required Continue assisting patient with tobacco cessation, weight loss management and recreational drug cessation.  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 Schinke was admitted to the hospital with the working diagnosis of acute heart failure decompensation.    67 yo male with the past medical history of coronary artery disease, hypertension, dyslipidemia, T2DM, GERD, and cocaine abuse who presented with dyspnea and edema. Reported worsening lower extremity and orthopnea for 2 weeks, more severe over the last 3 days prior to admission. On his initial physical examination his blood pressure was 109/93, HR 76, rr 24 and 02 saturation 96%, lungs with rales at bases with no wheezing or rhonchi, heart with S1 and S2 present and regular with no gallops or rubs, positive JVD, abdomen with no distention, non tender and positive lower extremity edema.    Na 134, K 3,7 Cl 102, bicarbonate 24, glucose 159, bun 32, cr 1,79  BNP 1,088 High sensitive troponin 10 and 10  Wbc 13,8 hgb 9,1 plt 229  Sars covid 19 negative    Chest radiograph with right rotation, positive  cardiomegaly, bilateral hilar vascular congestion, bilateral central interstitial infiltrates with small right pleural effusion.    EKG 75 bpm, left axis deviation, qtc 553, sinus rhythm with first degree AV block, poor R R wave progression, q wave v2 to v6, with no significant ST segment or T wave changes.    Patient was placed on furosemide for diuresis with improvement in volume status.   Assessment and Plan: * Acute on chronic systolic CHF (congestive heart failure) (HCC) Echocardiogram with reduced LV systolic function with EF 20 to 25%, global hypokinesis, interventricular septum is flattened in systole and diastole, RV with mild enlarged cavity and decreased systolic function, LV with entire apex akinetic,    -Patient reporting good urine output -Diuretics transitioned to oral route -Continue the use of carvedilol and Jardiance -Continue to follow urine output and increase physical activity -Currently no requiring oxygen for mentation -Holding on RAAS inhibition due to recovering acute kidney injury and chronic renal failure. -Low-sodium diet, daily weights, strict I's and O's and medication compliance discussed with patient.   Stage 3 chronic renal impairment associated with type 2 diabetes mellitus (HCC) -CKD stage 3b, hyponatremia at time of admission.  -Electrolytes has improved after stabilizing volume status. -Continue close monitoring patient renal function -Continue diuresis as mentioned above -Patient advised to maintain adequate oral hydration -Low-sodium diet discussed with patient. -Continue holding/minimizing nephrotoxic agents.   Coronary artery disease -No chest pain. No acute coronary syndrome.  -Continue statin and carvedilol   Essential hypertension -continue current antihypertensive agents -heart healthy/low sodium diet discussed with patient.   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

## 2022-10-25 NOTE — TOC Transition Note (Signed)
Transition of Care [facility]) - CM/SW Discharge Note   Patient Details  Name: [name] MRN: [id number] Date of Birth: 12-31-1955  [facility] CM/SW Contact:  Villa Herb, LCSWA Phone Number: 10/25/2022, 11:23 AM   Clinical Narrative:    CSW updated by MD that pt is medically stable for D/C home today. CSW updated Kandee Keen with [facility] of plan for D/C. MD placed HH orders. TOC signing off.   Final next level of care: Home w Home Health Services Barriers to Discharge: Barriers Resolved   Patient Goals and CMS Choice CMS Medicare.gov Compare Post Acute Care list provided to:: Patient Choice offered to / list presented to : Patient  Discharge Placement                         Discharge Plan and Services Additional resources added to the After Visit Summary for   In-house Referral: Clinical Social Work Discharge Planning Services: CM Consult Post Acute Care Choice: Home Health                      [facility] Agency: [facility] Date [facility] Agency Contacted: 10/25/22   Representative spoke with at [facility] Agency: Kandee Keen  Social Determinants of Health (SDOH) Interventions SDOH Screenings   Food Insecurity: No Food Insecurity (10/21/2022)  Housing: Low Risk  (10/21/2022)  Transportation Needs: No Transportation Needs (10/21/2022)  Utilities: Not At Risk (10/21/2022)  Tobacco Use: High Risk (10/21/2022)     Readmission Risk Interventions    10/21/2022    1:05 PM  Readmission Risk Prevention Plan  Transportation Screening Complete  HRI or Home Care Consult Complete  Social Work Consult for Recovery Care Planning/Counseling Complete  Palliative Care Screening Not Applicable  Medication Review Oceanographer) Complete

## 2022-10-25 NOTE — Consult Note (Signed)
Russell Customer service manager [facility]) Accountable Care Organization (ACO) [facility] Liaison Note  10/25/2022  [name] 18-Jun-1955 [id number]  Location: [facility] RN Hospital Liaison screened the patient remotely at [facility].  Insurance:  [employer]   [name] is a 67 y.o. male who is a Primary Care Patient of [name], [name], MD. The patient was screened for  readmission hospitalization with noted high risk score for unplanned readmission risk with 1 IP in 6 months.  The patient was assessed for potential [facility]) Care Management service needs for post hospital transition for care coordination. Review of patient's electronic medical record reveals patient was admitted for Congestive Heart Failure.  Review of chart due to green banner. Pt covered by VA benefits and will be discharged home with [facility].   [facility] Care Management/Population Health does not replace or interfere with any arrangements made by the Inpatient Transition of Care team.   For questions contact:   Elliot Cousin, RN, [facility] Liaison [place]   Population Health Office Hours MTWF  8:00 am-6:00 pm [phone] mobile [phone] [Office toll free line] Office Hours are M-F 8:30 - 5 pm [email or web address]

## 2022-10-26 LAB — CULTURE, BLOOD (ROUTINE X 2)
Culture: NO GROWTH
Culture: NO GROWTH

## 2022-11-01 ENCOUNTER — Emergency Department (HOSPITAL_COMMUNITY)
Admission: EM | Admit: 2022-11-01 | Discharge: 2022-11-01 | Disposition: A | Discharge status: Home / Self Care | Payer: No Typology Code available for payment source | Attending: Emergency Medicine | Admitting: Emergency Medicine

## 2022-11-01 ENCOUNTER — Emergency Department (HOSPITAL_COMMUNITY): Payer: No Typology Code available for payment source

## 2022-11-01 ENCOUNTER — Encounter (HOSPITAL_COMMUNITY): Payer: Self-pay

## 2022-11-01 DIAGNOSIS — Z7982 Long term (current) use of aspirin: Secondary | ICD-10-CM | POA: Insufficient documentation

## 2022-11-01 DIAGNOSIS — N189 Chronic kidney disease, unspecified: Secondary | ICD-10-CM | POA: Diagnosis not present

## 2022-11-01 DIAGNOSIS — I2510 Atherosclerotic heart disease of native coronary artery without angina pectoris: Secondary | ICD-10-CM | POA: Diagnosis not present

## 2022-11-01 DIAGNOSIS — N433 Hydrocele, unspecified: Secondary | ICD-10-CM | POA: Insufficient documentation

## 2022-11-01 DIAGNOSIS — E1122 Type 2 diabetes mellitus with diabetic chronic kidney disease: Secondary | ICD-10-CM | POA: Diagnosis not present

## 2022-11-01 DIAGNOSIS — N50811 Right testicular pain: Secondary | ICD-10-CM | POA: Diagnosis present

## 2022-11-01 DIAGNOSIS — Z7984 Long term (current) use of oral hypoglycemic drugs: Secondary | ICD-10-CM | POA: Insufficient documentation

## 2022-11-01 DIAGNOSIS — J90 Pleural effusion, not elsewhere classified: Secondary | ICD-10-CM | POA: Insufficient documentation

## 2022-11-01 DIAGNOSIS — N50812 Left testicular pain: Secondary | ICD-10-CM

## 2022-11-01 LAB — CBC
HCT: 35.5 % — ABNORMAL LOW (ref 39.0–52.0)
Hemoglobin: 10 g/dL — ABNORMAL LOW (ref 13.0–17.0)
MCH: 22 pg — ABNORMAL LOW (ref 26.0–34.0)
MCHC: 28.2 g/dL — ABNORMAL LOW (ref 30.0–36.0)
MCV: 78.2 fL — ABNORMAL LOW (ref 80.0–100.0)
Platelets: 325 10*3/uL (ref 150–400)
RBC: 4.54 MIL/uL (ref 4.22–5.81)
RDW: 22.1 % — ABNORMAL HIGH (ref 11.5–15.5)
WBC: 11.9 10*3/uL — ABNORMAL HIGH (ref 4.0–10.5)
nRBC: 0.8 % — ABNORMAL HIGH (ref 0.0–0.2)

## 2022-11-01 LAB — COMPREHENSIVE METABOLIC PANEL
ALT: 20 U/L (ref 0–44)
AST: 24 U/L (ref 15–41)
Albumin: 3.2 g/dL — ABNORMAL LOW (ref 3.5–5.0)
Alkaline Phosphatase: 118 U/L (ref 38–126)
Anion gap: 12 (ref 5–15)
BUN: 24 mg/dL — ABNORMAL HIGH (ref 8–23)
CO2: 21 mmol/L — ABNORMAL LOW (ref 22–32)
Calcium: 8.7 mg/dL — ABNORMAL LOW (ref 8.9–10.3)
Chloride: 104 mmol/L (ref 98–111)
Creatinine, Ser: 1.42 mg/dL — ABNORMAL HIGH (ref 0.61–1.24)
GFR, Estimated: 54 mL/min — ABNORMAL LOW (ref >60)
Glucose, Bld: 142 mg/dL — ABNORMAL HIGH (ref 70–99)
Potassium: 3.7 mmol/L (ref 3.5–5.1)
Sodium: 137 mmol/L (ref 135–145)
Total Bilirubin: 2.3 mg/dL — ABNORMAL HIGH (ref 0.3–1.2)
Total Protein: 9.2 g/dL — ABNORMAL HIGH (ref 6.5–8.1)

## 2022-11-01 LAB — URINALYSIS, ROUTINE W REFLEX MICROSCOPIC
Bacteria, UA: NONE SEEN
Bilirubin Urine: NEGATIVE
Glucose, UA: 50 mg/dL — AB
Ketones, ur: NEGATIVE mg/dL
Leukocytes,Ua: NEGATIVE
Nitrite: NEGATIVE
Protein, ur: >=300 mg/dL — AB
Specific Gravity, Urine: 1.014 (ref 1.005–1.030)
pH: 6 (ref 5.0–8.0)

## 2022-11-01 LAB — LIPASE, BLOOD: Lipase: 36 U/L (ref 11–51)

## 2022-11-01 MED ORDER — IOHEXOL 300 MG/ML  SOLN
100.0000 mL | Freq: Once | INTRAMUSCULAR | Status: AC | PRN
  Administered 2022-11-01: 100 mL via INTRAVENOUS

## 2022-11-01 MED ORDER — ACETAMINOPHEN 325 MG PO TABS
650.0000 mg | ORAL_TABLET | Freq: Once | ORAL | Status: AC
  Administered 2022-11-01: 650 mg via ORAL
  Filled 2022-11-01: qty 2

## 2022-11-01 MED ORDER — LIDOCAINE 5 % EX PTCH
1.0000 | MEDICATED_PATCH | CUTANEOUS | Status: DC
  Administered 2022-11-01: 1 via TRANSDERMAL
  Filled 2022-11-01: qty 1

## 2022-11-01 MED ORDER — ACETAMINOPHEN 325 MG PO TABS
650.0000 mg | ORAL_TABLET | Freq: Once | ORAL | Status: DC
  Filled 2022-11-01: qty 2

## 2022-11-01 NOTE — ED Notes (Signed)
ED Provider at bedside. 

## 2022-11-01 NOTE — ED Notes (Signed)
Patient transported to CT 

## 2022-11-01 NOTE — Discharge Instructions (Addendum)
You have a left hydrocele and a 1.1 cm left epididymal cyst. The hydrocele should self-resolve. No acute findings in the lower abdomen. You do have a moderate pleural effusion. You can get lidocaine patches OTC to use for your right upper abdominal pain.  [facility] as well as your cardiologist for reevaluation of your symptoms.   Get help right away if: You are short of breath. You develop chest pain. You develop a new cough.

## 2022-11-01 NOTE — ED Notes (Signed)
Pt returned from CT °

## 2022-11-01 NOTE — ED Provider Notes (Signed)
[place] EMERGENCY DEPARTMENT AT [facility] Provider Note   CSN: [id number] Arrival date & time: 11/01/22  1018     History  Chief Complaint  Patient presents with   Testicle Pain    [name] is a 67 y.o. male with a history of CKD, CAD, type 2 diabetes mellitus who presents the ED today with scrotal pain.  Patient reports chronic testicular pain that radiates to his abdomen for the past several months for which he has been seen at the [facility] for.  He states that his right testicle is more painful than his left and is not sure if they are swollen.  He states that last night he was laying in bed and the pain became more severe and reports that radiates to his lower abdomen and right flank.  Endorses increased urination and nausea.  No fever, vomiting, penile discharge, or changes to bowel habits.  He denies any recent sexual activity.  No additional complaints or concerns at this time.    Home Medications Prior to Admission medications   Medication Sig Start Date End Date Taking? Authorizing Provider  ASPIRIN LOW DOSE 81 MG tablet Take 81 mg by mouth daily as needed for moderate pain or mild pain. 10/14/22   [provider]  atorvastatin (LIPITOR) 40 MG tablet Take 1 tablet (40 mg total) by mouth at bedtime. 10/25/22 11/24/22  Vassie Loll, MD  carvedilol (COREG) 3.125 MG tablet Take 1 tablet (3.125 mg total) by mouth 2 (two) times daily with a meal. 10/25/22 11/24/22  Vassie Loll, MD  empagliflozin (JARDIANCE) 10 MG TABS tablet Take 1 tablet (10 mg total) by mouth daily. 10/26/22   Vassie Loll, MD  furosemide (LASIX) 40 MG tablet Take 1 tablet (40 mg total) by mouth daily. 10/25/22 11/24/22  Vassie Loll, MD  gabapentin (NEURONTIN) 300 MG capsule Take 1 capsule (300 mg total) by mouth 3 (three) times daily. 10/25/22   Vassie Loll, MD  nicotine (NICODERM CQ - DOSED IN MG/24 HOURS) 21 mg/24hr patch Place 1 patch (21 mg total) onto the skin daily.  10/26/22   Vassie Loll, MD  pantoprazole (PROTONIX) 40 MG tablet Take 1 tablet (40 mg total) by mouth daily. 10/26/22   Vassie Loll, MD  sildenafil (VIAGRA) 50 MG tablet Take 50 mg by mouth daily as needed. 08/26/22   [provider]      Allergies    Clopidogrel    Review of Systems   Review of Systems  Genitourinary:  Positive for testicular pain.  All other systems reviewed and are negative.   Physical Exam Updated Vital Signs BP 134/88   Pulse 89   Temp 97.7 F (36.5 C) (Oral)   Resp 20   Ht 5\' 9"  (1.753 m)   Wt 111.6 kg   SpO2 94%   BMI 36.33 kg/m  Physical Exam Vitals and nursing note reviewed. Exam conducted with a chaperone present.  Constitutional:      General: He is not in acute distress.    Appearance: Normal appearance.  HENT:     Head: Normocephalic and atraumatic.     Mouth/Throat:     Mouth: Mucous membranes are moist.  Eyes:     Conjunctiva/sclera: Conjunctivae normal.     Pupils: Pupils are equal, round, and reactive to light.  Cardiovascular:     Rate and Rhythm: Normal rate and regular rhythm.     Pulses: Normal pulses.     Heart sounds: Normal heart sounds.  Pulmonary:     Effort: Pulmonary effort is normal.     Breath sounds: Normal breath sounds.  Abdominal:     Palpations: Abdomen is soft.     Tenderness: There is abdominal tenderness. There is right CVA tenderness. There is no left CVA tenderness, guarding or rebound.     Comments: Lower abdominal tenderness that radiates to the right flank  Genitourinary:    Penis: Normal.      Testes: Normal.     Comments: Right testicle more tender than the left, no obvious swelling Musculoskeletal:        General: Normal range of motion.  Skin:    General: Skin is warm and dry.     Findings: No rash.  Neurological:     General: No focal deficit present.     Mental Status: He is alert.     Sensory: No sensory deficit.     Motor: No weakness.  Psychiatric:        Mood and Affect:  Mood normal.        Behavior: Behavior normal.    ED Results / Procedures / Treatments   Labs (all labs ordered are listed, but only abnormal results are displayed) Labs Reviewed  COMPREHENSIVE METABOLIC PANEL - Abnormal; Notable for the following components:      Result Value   CO2 21 (*)    Glucose, Bld 142 (*)    BUN 24 (*)    Creatinine, Ser 1.42 (*)    Calcium 8.7 (*)    Total Protein 9.2 (*)    Albumin 3.2 (*)    Total Bilirubin 2.3 (*)    GFR, Estimated 54 (*)    All other components within normal limits  CBC - Abnormal; Notable for the following components:   WBC 11.9 (*)    Hemoglobin 10.0 (*)    HCT 35.5 (*)    MCV 78.2 (*)    MCH 22.0 (*)    MCHC 28.2 (*)    RDW 22.1 (*)    nRBC 0.8 (*)    All other components within normal limits  URINALYSIS, ROUTINE W REFLEX MICROSCOPIC - Abnormal; Notable for the following components:   Glucose, UA 50 (*)    Hgb urine dipstick SMALL (*)    Protein, ur >=300 (*)    All other components within normal limits  LIPASE, BLOOD    EKG None  Radiology CT ABDOMEN PELVIS W CONTRAST  Result Date: 11/01/2022 CLINICAL DATA:  Abdominal pain and flank pain.  Stone suspected EXAM: CT ABDOMEN AND PELVIS WITH CONTRAST TECHNIQUE: Multidetector CT imaging of the abdomen and pelvis was performed using the standard protocol following bolus administration of intravenous contrast. RADIATION DOSE REDUCTION: This exam was performed according to the departmental dose-optimization program which includes automated exposure control, adjustment of the mA and/or kV according to patient size and/or use of iterative reconstruction technique. CONTRAST:  OMNIPAQUE IOHEXOL 300 MG/ML  SOLN COMPARISON:  None Available. FINDINGS: Lower chest: Moderate pleural effusion Hepatobiliary: No focal hepatic lesion. Two tiny gallstones measuring less than 5 mm each. No gallbladder distension. No biliary duct dilatation. Common bile duct is normal. Pancreas: Pancreas  is normal. No ductal dilatation. No pancreatic inflammation. Spleen: Normal spleen Adrenals/urinary tract: Adrenal glands normal. No nephrolithiasis or ureterolithiasis. Perinephric stranding similar comparison CT. No renal obstruction on delayed imaging. Bladder normal. Stomach/Bowel: Stomach, small-bowel and cecum are normal. The appendix is not identified but there is no pericecal inflammation to suggest appendicitis. Insert Vascular/Lymphatic:  Abdominal aorta is normal caliber with atherosclerotic calcification. There is no retroperitoneal or periportal lymphadenopathy. No pelvic lymphadenopathy. Reproductive: Prostate normal Other: small volume of free fluid along the LEFT and RIGHT pericolic gutter. Musculoskeletal: No aggressive osseous lesion. IMPRESSION: 1. No nephrolithiasis or ureterolithiasis. 2. Perinephric stranding similar to comparison CT. No renal obstruction on delayed imaging. 3. Moderate pleural effusion. 4. Small volume of free fluid along the LEFT and RIGHT pericolic gutter. 5. Cholelithiasis without evidence of cholecystitis. 6.  Aortic Atherosclerosis (ICD10-I70.0). Electronically Signed   By: Genevive Bi M.D.   On: 11/01/2022 16:45   US SCROTUM W/DOPPLER  Result Date: 11/01/2022 CLINICAL DATA:  Testicular pain EXAM: SCROTAL ULTRASOUND DOPPLER ULTRASOUND OF THE TESTICLES TECHNIQUE: Complete ultrasound examination of the testicles, epididymis, and other scrotal structures was performed. Color and spectral Doppler ultrasound were also utilized to evaluate blood flow to the testicles. COMPARISON:  None Available. FINDINGS: Right testicle Measurements: 3.3 x 1.6 x 2.8 cm. No mass or microlithiasis visualized. Left testicle Measurements: 3.4 x 2.2 x 2.2 cm. No mass or microlithiasis visualized. Right epididymis:  Normal in size and appearance. Left epididymis: Epididymal head cyst with internal echogenic debris measuring 1.1 x 1.0 x 1.0 cm. Hydrocele:  Small left hydrocele. Varicocele:   None visualized. Pulsed Doppler interrogation of both testes demonstrates normal low resistance arterial and venous waveforms bilaterally. IMPRESSION: 1. Complex left epididymal head cyst measuring up to 1.1 cm. 2. Small left hydrocele. Electronically Signed   By: Allegra Lai M.D.   On: 11/01/2022 11:46    Procedures Procedures: not indicated.   Medications Ordered in ED Medications  acetaminophen (TYLENOL) tablet 650 mg (650 mg Oral Given 11/01/22 1249)  iohexol (OMNIPAQUE) 300 MG/ML solution 100 mL (100 mLs Intravenous Contrast Given 11/01/22 1434)    ED Course/ Medical Decision Making/ A&P                                 Medical Decision Making Amount and/or Complexity of Data Reviewed Labs: ordered. Radiology: ordered.  Risk OTC drugs. Prescription drug management.   This patient presents to the ED for concern of testicular pain, this involves an extensive number of treatment options, and is a complaint that carries with it a high risk of complications and morbidity.   Differential diagnosis includes: UTI, pyelonephritis, kidney stone, inguinal hernia, hydrocele, varicocele, testicular torsion, epididymitis, STI, IBS, IBD, appendicitis, diverticulitis, etc.   Comorbidities  See HPI above   Additional History  Additional history obtained from previous hospitalization note.   Lab Tests  I ordered and personally interpreted labs.  The pertinent results include:   CMP and CBC are within normal limits for patient UA is negative for infection Lipase is unremarkable   Imaging Studies  I ordered imaging studies including scrotal US and abdominal CT  I independently visualized and interpreted imaging which showed:  US shows - complex left epididymal head cyst measuring up to 1.1 cm and small left hydrocele. CT abdomen - no nephrolithiasis or ureterolithiasis. Perinephric stranding similar to prior CT. no renal obstruction or delayed imaging.  Moderate pleural  effusion.  Small volume of free fluid along the left and right pericolic gutter.  Cholelithiasis without evidence of cholecystitis. I agree with the radiologist interpretation   Problem List / ED Course / Critical Interventions / Medication Management  Acute exacerbation of chronic testicular and abdominal pain I ordered medications including: Acetaminophen for pain  Reevaluation of  the patient after these medicines showed that the patient improved. I have reviewed the patients home medicines and have made adjustments as needed Brandy Banker) informed me that patient starting complaining of RUQ pain with shortness of breath. He does not use home oxygen. He had a small pleural effusion 10/21/22 when discharged from the hospital. With ambulation around the room patient's O2 sats alternate between 92-100%. Additional acetaminophen and lidocaine patch was applied to the area of pain prior to discharge.   Social Determinants of Health  Tobacco use   Test / Admission - Considered  Discussed findings with patient.  He is hemodynamically stable and safe for discharge home. Return precautions provided.        Final Clinical Impression(s) / ED Diagnoses Final diagnoses:  Pain in both testicles  Left hydrocele  Moderate sized pleural effusion    Rx / DC Orders ED Discharge Orders     None         Maxwell Marion, PA-C 11/01/22 1829    Gloris Manchester, MD 11/02/22 [phone]

## 2022-11-01 NOTE — ED Triage Notes (Signed)
Pt c/o testicular pain that started years ago but got more painful last night.  Pt reports he isn't sure if his testicles are swollen.  Reports pain radiating into his abdomen. Endorses frequent urination, nausea & multiple solid bowel movements since yesterday.

## 2022-11-02 ENCOUNTER — Encounter (HOSPITAL_COMMUNITY): Payer: Self-pay | Admitting: Emergency Medicine

## 2022-11-02 ENCOUNTER — Emergency Department (HOSPITAL_COMMUNITY): Payer: No Typology Code available for payment source

## 2022-11-02 ENCOUNTER — Inpatient Hospital Stay (HOSPITAL_COMMUNITY)
Admission: EM | Admit: 2022-11-02 | Discharge: 2022-11-15 | DRG: 291 | Disposition: A | Discharge status: Home Health | Payer: No Typology Code available for payment source | Attending: Internal Medicine | Admitting: Internal Medicine

## 2022-11-02 ENCOUNTER — Other Ambulatory Visit: Payer: Self-pay

## 2022-11-02 DIAGNOSIS — I130 Hypertensive heart and chronic kidney disease with heart failure and stage 1 through stage 4 chronic kidney disease, or unspecified chronic kidney disease: Secondary | ICD-10-CM | POA: Diagnosis not present

## 2022-11-02 DIAGNOSIS — K219 Gastro-esophageal reflux disease without esophagitis: Secondary | ICD-10-CM | POA: Diagnosis present

## 2022-11-02 DIAGNOSIS — L850 Acquired ichthyosis: Secondary | ICD-10-CM | POA: Diagnosis present

## 2022-11-02 DIAGNOSIS — F1413 Cocaine abuse, unspecified with withdrawal: Secondary | ICD-10-CM | POA: Diagnosis present

## 2022-11-02 DIAGNOSIS — D320 Benign neoplasm of cerebral meninges: Secondary | ICD-10-CM | POA: Diagnosis present

## 2022-11-02 DIAGNOSIS — D509 Iron deficiency anemia, unspecified: Secondary | ICD-10-CM | POA: Diagnosis present

## 2022-11-02 DIAGNOSIS — T380X5A Adverse effect of glucocorticoids and synthetic analogues, initial encounter: Secondary | ICD-10-CM | POA: Diagnosis present

## 2022-11-02 DIAGNOSIS — F05 Delirium due to known physiological condition: Secondary | ICD-10-CM | POA: Diagnosis present

## 2022-11-02 DIAGNOSIS — I440 Atrioventricular block, first degree: Secondary | ICD-10-CM | POA: Diagnosis present

## 2022-11-02 DIAGNOSIS — E1142 Type 2 diabetes mellitus with diabetic polyneuropathy: Secondary | ICD-10-CM | POA: Diagnosis present

## 2022-11-02 DIAGNOSIS — Z6836 Body mass index (BMI) 36.0-36.9, adult: Secondary | ICD-10-CM

## 2022-11-02 DIAGNOSIS — J9811 Atelectasis: Secondary | ICD-10-CM | POA: Diagnosis not present

## 2022-11-02 DIAGNOSIS — I252 Old myocardial infarction: Secondary | ICD-10-CM

## 2022-11-02 DIAGNOSIS — Z7984 Long term (current) use of oral hypoglycemic drugs: Secondary | ICD-10-CM

## 2022-11-02 DIAGNOSIS — I5023 Acute on chronic systolic (congestive) heart failure: Secondary | ICD-10-CM | POA: Diagnosis not present

## 2022-11-02 DIAGNOSIS — K5900 Constipation, unspecified: Secondary | ICD-10-CM | POA: Diagnosis present

## 2022-11-02 DIAGNOSIS — N183 Chronic kidney disease, stage 3 unspecified: Secondary | ICD-10-CM | POA: Diagnosis present

## 2022-11-02 DIAGNOSIS — F17200 Nicotine dependence, unspecified, uncomplicated: Secondary | ICD-10-CM | POA: Diagnosis present

## 2022-11-02 DIAGNOSIS — I2510 Atherosclerotic heart disease of native coronary artery without angina pectoris: Secondary | ICD-10-CM | POA: Diagnosis present

## 2022-11-02 DIAGNOSIS — N182 Chronic kidney disease, stage 2 (mild): Secondary | ICD-10-CM | POA: Diagnosis present

## 2022-11-02 DIAGNOSIS — D122 Benign neoplasm of ascending colon: Secondary | ICD-10-CM | POA: Diagnosis present

## 2022-11-02 DIAGNOSIS — G9341 Metabolic encephalopathy: Secondary | ICD-10-CM | POA: Diagnosis not present

## 2022-11-02 DIAGNOSIS — J9601 Acute respiratory failure with hypoxia: Secondary | ICD-10-CM | POA: Diagnosis not present

## 2022-11-02 DIAGNOSIS — Z794 Long term (current) use of insulin: Secondary | ICD-10-CM

## 2022-11-02 DIAGNOSIS — Z91148 Patient's other noncompliance with medication regimen for other reason: Secondary | ICD-10-CM

## 2022-11-02 DIAGNOSIS — Z7982 Long term (current) use of aspirin: Secondary | ICD-10-CM

## 2022-11-02 DIAGNOSIS — I5043 Acute on chronic combined systolic (congestive) and diastolic (congestive) heart failure: Secondary | ICD-10-CM | POA: Diagnosis present

## 2022-11-02 DIAGNOSIS — E1151 Type 2 diabetes mellitus with diabetic peripheral angiopathy without gangrene: Secondary | ICD-10-CM | POA: Diagnosis present

## 2022-11-02 DIAGNOSIS — E1149 Type 2 diabetes mellitus with other diabetic neurological complication: Secondary | ICD-10-CM | POA: Diagnosis present

## 2022-11-02 DIAGNOSIS — E1122 Type 2 diabetes mellitus with diabetic chronic kidney disease: Secondary | ICD-10-CM | POA: Diagnosis present

## 2022-11-02 DIAGNOSIS — K644 Residual hemorrhoidal skin tags: Secondary | ICD-10-CM | POA: Diagnosis present

## 2022-11-02 DIAGNOSIS — Z888 Allergy status to other drugs, medicaments and biological substances status: Secondary | ICD-10-CM

## 2022-11-02 DIAGNOSIS — F1026 Alcohol dependence with alcohol-induced persisting amnestic disorder: Secondary | ICD-10-CM | POA: Diagnosis present

## 2022-11-02 DIAGNOSIS — G936 Cerebral edema: Secondary | ICD-10-CM | POA: Diagnosis present

## 2022-11-02 DIAGNOSIS — E785 Hyperlipidemia, unspecified: Secondary | ICD-10-CM | POA: Diagnosis present

## 2022-11-02 DIAGNOSIS — I5082 Biventricular heart failure: Secondary | ICD-10-CM | POA: Diagnosis present

## 2022-11-02 DIAGNOSIS — I509 Heart failure, unspecified: Principal | ICD-10-CM

## 2022-11-02 DIAGNOSIS — G4733 Obstructive sleep apnea (adult) (pediatric): Secondary | ICD-10-CM | POA: Diagnosis present

## 2022-11-02 DIAGNOSIS — R7989 Other specified abnormal findings of blood chemistry: Secondary | ICD-10-CM | POA: Diagnosis present

## 2022-11-02 DIAGNOSIS — R6889 Other general symptoms and signs: Secondary | ICD-10-CM | POA: Diagnosis not present

## 2022-11-02 DIAGNOSIS — Z8249 Family history of ischemic heart disease and other diseases of the circulatory system: Secondary | ICD-10-CM

## 2022-11-02 DIAGNOSIS — Z79899 Other long term (current) drug therapy: Secondary | ICD-10-CM

## 2022-11-02 DIAGNOSIS — I739 Peripheral vascular disease, unspecified: Secondary | ICD-10-CM | POA: Diagnosis present

## 2022-11-02 DIAGNOSIS — Z91199 Patient's noncompliance with other medical treatment and regimen due to unspecified reason: Secondary | ICD-10-CM

## 2022-11-02 DIAGNOSIS — Z955 Presence of coronary angioplasty implant and graft: Secondary | ICD-10-CM

## 2022-11-02 DIAGNOSIS — D123 Benign neoplasm of transverse colon: Secondary | ICD-10-CM | POA: Diagnosis present

## 2022-11-02 DIAGNOSIS — R195 Other fecal abnormalities: Secondary | ICD-10-CM | POA: Diagnosis present

## 2022-11-02 DIAGNOSIS — N50819 Testicular pain, unspecified: Secondary | ICD-10-CM | POA: Diagnosis present

## 2022-11-02 DIAGNOSIS — I10 Essential (primary) hypertension: Secondary | ICD-10-CM | POA: Diagnosis present

## 2022-11-02 DIAGNOSIS — I82522 Chronic embolism and thrombosis of left iliac vein: Secondary | ICD-10-CM | POA: Diagnosis present

## 2022-11-02 DIAGNOSIS — E669 Obesity, unspecified: Secondary | ICD-10-CM | POA: Diagnosis present

## 2022-11-02 DIAGNOSIS — I255 Ischemic cardiomyopathy: Secondary | ICD-10-CM | POA: Diagnosis present

## 2022-11-02 DIAGNOSIS — Z95828 Presence of other vascular implants and grafts: Secondary | ICD-10-CM

## 2022-11-02 DIAGNOSIS — D649 Anemia, unspecified: Secondary | ICD-10-CM

## 2022-11-02 DIAGNOSIS — K2970 Gastritis, unspecified, without bleeding: Secondary | ICD-10-CM

## 2022-11-02 DIAGNOSIS — K625 Hemorrhage of anus and rectum: Secondary | ICD-10-CM | POA: Diagnosis not present

## 2022-11-02 DIAGNOSIS — K2990 Gastroduodenitis, unspecified, without bleeding: Secondary | ICD-10-CM

## 2022-11-02 DIAGNOSIS — N1832 Chronic kidney disease, stage 3b: Secondary | ICD-10-CM | POA: Diagnosis present

## 2022-11-02 DIAGNOSIS — K2980 Duodenitis without bleeding: Secondary | ICD-10-CM | POA: Diagnosis not present

## 2022-11-02 DIAGNOSIS — E1165 Type 2 diabetes mellitus with hyperglycemia: Secondary | ICD-10-CM | POA: Diagnosis present

## 2022-11-02 DIAGNOSIS — K648 Other hemorrhoids: Secondary | ICD-10-CM | POA: Diagnosis present

## 2022-11-02 DIAGNOSIS — F17210 Nicotine dependence, cigarettes, uncomplicated: Secondary | ICD-10-CM | POA: Diagnosis present

## 2022-11-02 DIAGNOSIS — Z743 Need for continuous supervision: Secondary | ICD-10-CM | POA: Diagnosis not present

## 2022-11-02 DIAGNOSIS — J90 Pleural effusion, not elsewhere classified: Secondary | ICD-10-CM | POA: Diagnosis present

## 2022-11-02 DIAGNOSIS — R0902 Hypoxemia: Secondary | ICD-10-CM | POA: Diagnosis not present

## 2022-11-02 DIAGNOSIS — Z833 Family history of diabetes mellitus: Secondary | ICD-10-CM

## 2022-11-02 HISTORY — DX: Fournier gangrene: N49.3

## 2022-11-02 LAB — GLUCOSE, CAPILLARY
Glucose-Capillary: 123 mg/dL — ABNORMAL HIGH (ref 70–99)
Glucose-Capillary: 129 mg/dL — ABNORMAL HIGH (ref 70–99)

## 2022-11-02 LAB — MAGNESIUM: Magnesium: 2.2 mg/dL (ref 1.7–2.4)

## 2022-11-02 LAB — BASIC METABOLIC PANEL
Anion gap: 8 (ref 5–15)
BUN: 19 mg/dL (ref 8–23)
CO2: 26 mmol/L (ref 22–32)
Calcium: 8.4 mg/dL — ABNORMAL LOW (ref 8.9–10.3)
Chloride: 105 mmol/L (ref 98–111)
Creatinine, Ser: 1.34 mg/dL — ABNORMAL HIGH (ref 0.61–1.24)
GFR, Estimated: 58 mL/min — ABNORMAL LOW (ref >60)
Glucose, Bld: 119 mg/dL — ABNORMAL HIGH (ref 70–99)
Potassium: 4.2 mmol/L (ref 3.5–5.1)
Sodium: 139 mmol/L (ref 135–145)

## 2022-11-02 LAB — CBC WITH DIFFERENTIAL/PLATELET
Abs Immature Granulocytes: 0.03 10*3/uL (ref 0.00–0.07)
Basophils Absolute: 0.1 10*3/uL (ref 0.0–0.1)
Basophils Relative: 1 %
Eosinophils Absolute: 0.6 10*3/uL — ABNORMAL HIGH (ref 0.0–0.5)
Eosinophils Relative: 5 %
HCT: 32.6 % — ABNORMAL LOW (ref 39.0–52.0)
Hemoglobin: 9.3 g/dL — ABNORMAL LOW (ref 13.0–17.0)
Immature Granulocytes: 0 %
Lymphocytes Relative: 7 %
Lymphs Abs: 0.7 10*3/uL (ref 0.7–4.0)
MCH: 22.4 pg — ABNORMAL LOW (ref 26.0–34.0)
MCHC: 28.5 g/dL — ABNORMAL LOW (ref 30.0–36.0)
MCV: 78.6 fL — ABNORMAL LOW (ref 80.0–100.0)
Monocytes Absolute: 0.5 10*3/uL (ref 0.1–1.0)
Monocytes Relative: 5 %
Neutro Abs: 8.3 10*3/uL — ABNORMAL HIGH (ref 1.7–7.7)
Neutrophils Relative %: 82 %
Platelets: 259 10*3/uL (ref 150–400)
RBC: 4.15 MIL/uL — ABNORMAL LOW (ref 4.22–5.81)
RDW: 22 % — ABNORMAL HIGH (ref 11.5–15.5)
WBC: 10.1 10*3/uL (ref 4.0–10.5)
nRBC: 0.7 % — ABNORMAL HIGH (ref 0.0–0.2)

## 2022-11-02 LAB — RAPID URINE DRUG SCREEN, HOSP PERFORMED
Amphetamines: NOT DETECTED
Barbiturates: NOT DETECTED
Benzodiazepines: NOT DETECTED
Cocaine: POSITIVE — AB
Opiates: NOT DETECTED
Tetrahydrocannabinol: NOT DETECTED

## 2022-11-02 LAB — HEPATIC FUNCTION PANEL
ALT: 16 U/L (ref 0–44)
AST: 25 U/L (ref 15–41)
Albumin: 2.8 g/dL — ABNORMAL LOW (ref 3.5–5.0)
Alkaline Phosphatase: 95 U/L (ref 38–126)
Bilirubin, Direct: 0.7 mg/dL — ABNORMAL HIGH (ref 0.0–0.2)
Indirect Bilirubin: 0.9 mg/dL (ref 0.3–0.9)
Total Bilirubin: 1.6 mg/dL — ABNORMAL HIGH (ref 0.3–1.2)
Total Protein: 8 g/dL (ref 6.5–8.1)

## 2022-11-02 LAB — URINALYSIS, ROUTINE W REFLEX MICROSCOPIC
Bacteria, UA: NONE SEEN
Bilirubin Urine: NEGATIVE
Glucose, UA: NEGATIVE mg/dL
Ketones, ur: NEGATIVE mg/dL
Leukocytes,Ua: NEGATIVE
Nitrite: NEGATIVE
Protein, ur: >=300 mg/dL — AB
Specific Gravity, Urine: 1.043 — ABNORMAL HIGH (ref 1.005–1.030)
pH: 5 (ref 5.0–8.0)

## 2022-11-02 LAB — TROPONIN I (HIGH SENSITIVITY)
Troponin I (High Sensitivity): 16 ng/L (ref <18)
Troponin I (High Sensitivity): 18 ng/L — ABNORMAL HIGH (ref <18)
Troponin I (High Sensitivity): 16 ng/L (ref <18)

## 2022-11-02 LAB — BRAIN NATRIURETIC PEPTIDE: B Natriuretic Peptide: 1424 pg/mL — ABNORMAL HIGH (ref 0.0–100.0)

## 2022-11-02 LAB — LIPASE, BLOOD: Lipase: 32 U/L (ref 11–51)

## 2022-11-02 LAB — LACTIC ACID, PLASMA: Lactic Acid, Venous: 2.3 mmol/L (ref 0.5–1.9)

## 2022-11-02 MED ORDER — METHOCARBAMOL 500 MG PO TABS
750.0000 mg | ORAL_TABLET | Freq: Three times a day (TID) | ORAL | Status: DC | PRN
  Administered 2022-11-04 – 2022-11-08 (×4): 750 mg via ORAL
  Filled 2022-11-02 (×4): qty 2

## 2022-11-02 MED ORDER — HYDROMORPHONE HCL 1 MG/ML IJ SOLN
0.5000 mg | Freq: Once | INTRAMUSCULAR | Status: AC
  Administered 2022-11-02: 0.5 mg via INTRAVENOUS
  Filled 2022-11-02: qty 0.5

## 2022-11-02 MED ORDER — INSULIN ASPART 100 UNIT/ML IJ SOLN
0.0000 [IU] | Freq: Three times a day (TID) | INTRAMUSCULAR | Status: DC
  Administered 2022-11-02 – 2022-11-04 (×4): 2 [IU] via SUBCUTANEOUS
  Administered 2022-11-04: 5 [IU] via SUBCUTANEOUS
  Administered 2022-11-05: 3 [IU] via SUBCUTANEOUS
  Administered 2022-11-06 (×2): 2 [IU] via SUBCUTANEOUS
  Administered 2022-11-07: 3 [IU] via SUBCUTANEOUS
  Administered 2022-11-08: 8 [IU] via SUBCUTANEOUS
  Administered 2022-11-08: 3 [IU] via SUBCUTANEOUS
  Administered 2022-11-08: 15 [IU] via SUBCUTANEOUS
  Administered 2022-11-09: 5 [IU] via SUBCUTANEOUS
  Administered 2022-11-09 (×2): 8 [IU] via SUBCUTANEOUS
  Administered 2022-11-10: 5 [IU] via SUBCUTANEOUS

## 2022-11-02 MED ORDER — ACETAMINOPHEN 650 MG RE SUPP: 650.0000 mg | Freq: Four times a day (QID) | RECTAL | Status: DC | PRN

## 2022-11-02 MED ORDER — ACETAMINOPHEN 325 MG PO TABS
650.0000 mg | ORAL_TABLET | Freq: Four times a day (QID) | ORAL | Status: DC | PRN
  Administered 2022-11-03 – 2022-11-15 (×9): 650 mg via ORAL
  Filled 2022-11-02 (×10): qty 2

## 2022-11-02 MED ORDER — PANTOPRAZOLE SODIUM 40 MG PO TBEC
40.0000 mg | DELAYED_RELEASE_TABLET | Freq: Every day | ORAL | Status: DC
  Administered 2022-11-02 – 2022-11-09 (×8): 40 mg via ORAL
  Filled 2022-11-02 (×8): qty 1

## 2022-11-02 MED ORDER — GABAPENTIN 300 MG PO CAPS
300.0000 mg | ORAL_CAPSULE | Freq: Three times a day (TID) | ORAL | Status: DC
  Administered 2022-11-02 – 2022-11-15 (×38): 300 mg via ORAL
  Filled 2022-11-02 (×4): qty 1
  Filled 2022-11-02: qty 3
  Filled 2022-11-02 (×9): qty 1
  Filled 2022-11-02: qty 3
  Filled 2022-11-02 (×7): qty 1
  Filled 2022-11-02: qty 3
  Filled 2022-11-02 (×10): qty 1
  Filled 2022-11-02: qty 3
  Filled 2022-11-02 (×5): qty 1

## 2022-11-02 MED ORDER — ASPIRIN 81 MG PO TBEC
81.0000 mg | DELAYED_RELEASE_TABLET | Freq: Every day | ORAL | Status: DC
  Administered 2022-11-03 – 2022-11-15 (×11): 81 mg via ORAL
  Filled 2022-11-02 (×12): qty 1

## 2022-11-02 MED ORDER — IOHEXOL 350 MG/ML SOLN
100.0000 mL | Freq: Once | INTRAVENOUS | Status: AC | PRN
  Administered 2022-11-02: 100 mL via INTRAVENOUS

## 2022-11-02 MED ORDER — METHOCARBAMOL 500 MG PO TABS
1000.0000 mg | ORAL_TABLET | Freq: Once | ORAL | Status: AC
  Administered 2022-11-02: 1000 mg via ORAL
  Filled 2022-11-02: qty 2

## 2022-11-02 MED ORDER — ENOXAPARIN SODIUM 40 MG/0.4ML IJ SOSY
40.0000 mg | PREFILLED_SYRINGE | INTRAMUSCULAR | Status: DC
  Administered 2022-11-02 – 2022-11-14 (×13): 40 mg via SUBCUTANEOUS
  Filled 2022-11-02 (×13): qty 0.4

## 2022-11-02 MED ORDER — KETOROLAC TROMETHAMINE 15 MG/ML IJ SOLN: 15.0000 mg | Freq: Once | INTRAMUSCULAR | Status: DC

## 2022-11-02 MED ORDER — EMPAGLIFLOZIN 10 MG PO TABS
10.0000 mg | ORAL_TABLET | Freq: Every day | ORAL | Status: DC
  Administered 2022-11-03: 10 mg via ORAL
  Filled 2022-11-02: qty 1

## 2022-11-02 MED ORDER — CARVEDILOL 3.125 MG PO TABS
3.1250 mg | ORAL_TABLET | Freq: Two times a day (BID) | ORAL | Status: DC
  Administered 2022-11-02: 3.125 mg via ORAL
  Filled 2022-11-02 (×2): qty 1

## 2022-11-02 MED ORDER — NICOTINE 21 MG/24HR TD PT24
21.0000 mg | MEDICATED_PATCH | Freq: Every day | TRANSDERMAL | Status: DC
  Administered 2022-11-02 – 2022-11-15 (×12): 21 mg via TRANSDERMAL
  Filled 2022-11-02 (×14): qty 1

## 2022-11-02 MED ORDER — ATORVASTATIN CALCIUM 40 MG PO TABS
40.0000 mg | ORAL_TABLET | Freq: Every day | ORAL | Status: DC
  Administered 2022-11-02 – 2022-11-14 (×13): 40 mg via ORAL
  Filled 2022-11-02 (×13): qty 1

## 2022-11-02 MED ORDER — SODIUM CHLORIDE 0.9% FLUSH
3.0000 mL | Freq: Two times a day (BID) | INTRAVENOUS | Status: DC
  Administered 2022-11-02 – 2022-11-15 (×26): 3 mL via INTRAVENOUS

## 2022-11-02 MED ORDER — NITROGLYCERIN 0.4 MG SL SUBL: 0.4000 mg | SUBLINGUAL_TABLET | SUBLINGUAL | Status: DC | PRN

## 2022-11-02 MED ORDER — FUROSEMIDE 10 MG/ML IJ SOLN
40.0000 mg | Freq: Once | INTRAMUSCULAR | Status: AC
  Administered 2022-11-02: 40 mg via INTRAVENOUS
  Filled 2022-11-02: qty 4

## 2022-11-02 MED ORDER — INSULIN ASPART 100 UNIT/ML IJ SOLN
0.0000 [IU] | Freq: Every day | INTRAMUSCULAR | Status: DC
  Administered 2022-11-07 – 2022-11-08 (×2): 2 [IU] via SUBCUTANEOUS
  Administered 2022-11-09: 4 [IU] via SUBCUTANEOUS

## 2022-11-02 NOTE — Progress Notes (Signed)
Patient arrived to room 324 and placed on telemetry monitor.

## 2022-11-02 NOTE — ED Notes (Signed)
Pt c/o chronic back pain and hollars when he moves around in bed due to his back pain per pt. BLE swelling noted with LLE more swollen than right with 2 plus pitting edema noted.

## 2022-11-02 NOTE — Progress Notes (Signed)
Pt. Began to complain of mid chest pain. Vital done and WNL and EKG done. Dr. Jarvis Newcomer notified. Pt. Now says it has "eased up."

## 2022-11-02 NOTE — ED Triage Notes (Signed)
Pt here yesterday for same. All over abd pain and testicle pain and shob. Pt states is no better. Pt was mildy shob with moving to stretcher. Pt grunting and grimacing in pain. Sats 88%ra. Placed on 2L [place]

## 2022-11-02 NOTE — ED Notes (Signed)
ED TO INPATIENT HANDOFF REPORT  ED Nurse Name and Phone #: Barrett Henle Name/Age/Gender [name] 67 y.o. male Room/Bed: APA04/APA04  Code Status   Code Status: Prior  Home/SNF/Other Home Patient oriented to: self, place, time, and situation Is this baseline? Yes   Triage Complete: Triage complete  Chief Complaint Acute on chronic HFrEF (heart failure with reduced ejection fraction) (HCC) [I50.23]  Triage Note  Pt here yesterday for same. All over abd pain and testicle pain and shob. Pt states is no better. Pt was mildy shob with moving to stretcher. Pt grunting and grimacing in pain. Sats 88%ra. Placed on 2L [place]   Allergies Allergies  Allergen Reactions   Clopidogrel Other (See Comments)    Drowsy, Skin irritation    Level of Care/Admitting Diagnosis ED Disposition     ED Disposition  Admit   Condition  --   Comment  Hospital Area: [facility] [[id number]]  Level of Care: Telemetry [5]  Covid Evaluation: Asymptomatic - no recent exposure (last 10 days) testing not required  Diagnosis: Acute on chronic HFrEF (heart failure with reduced ejection fraction) [facility]) [[id number]]  Admitting Physician: Tyrone Nine [phone]  Attending Physician: Tyrone Nine [phone]          B Medical/Surgery History Past Medical History:  Diagnosis Date   Bulging lumbar disc    CAD (coronary artery disease) [id number]   a. prior LAD stenting. b. s/p DES to [facility] 08/2015. c. 04/2016 Cardiac cath at [facility]. Patent stent in the PLAD and RCA. Diffuse dLAD, OM2, and  RPDA disease. d. DES to PDA and distal RCA 03/2019    Chronic lower back pain    CKD (chronic kidney disease), stage II    Cocaine abuse (HCC)    Cyst of epididymis    DM2 (diabetes mellitus, type 2) (HCC)    Essential hypertension    GERD (gastroesophageal reflux disease)    Headache    History of pneumonia    Hyperlipidemia    Ischemic cardiomyopathy    LV (left ventricular) mural thrombus     Sleep apnea    Past Surgical History:  Procedure Laterality Date   APPENDECTOMY     BRONCHIAL NEEDLE ASPIRATION BIOPSY  11/12/2021   Procedure: BRONCHIAL NEEDLE ASPIRATION BIOPSIES;  Surgeon: Omar Person, MD;  Location: [facility] ENDOSCOPY;  Service: Pulmonary;;   CARDIAC CATHETERIZATION N/A 09/07/2015   Procedure: Left Heart Cath and Coronary Angiography;  Surgeon: Marykay Lex, MD;  Location: [facility] INVASIVE CV LAB;  Service: Cardiovascular;  Laterality: N/A;   CARDIAC CATHETERIZATION N/A 09/07/2015   Procedure: Coronary Stent Intervention;  Surgeon: Marykay Lex, MD;  Location: [facility] INVASIVE CV LAB;  Service: Cardiovascular;  Laterality: N/A;   CORONARY ANGIOGRAM  09/07/13   residual RCA and OM disease   CORONARY ANGIOPLASTY WITH STENT PLACEMENT     CORONARY STENT INTERVENTION N/A 10/16/2018   Procedure: CORONARY STENT INTERVENTION;  Surgeon: Kathleene Hazel, MD;  Location: [facility] INVASIVE CV LAB;  Service: Cardiovascular;  Laterality: N/A;   CORONARY STENT INTERVENTION N/A 03/11/2019   Procedure: CORONARY STENT INTERVENTION;  Surgeon: Corky Crafts, MD;  Location: [facility] INVASIVE CV LAB;  Service: Cardiovascular;  Laterality: N/A;   FRACTURE SURGERY     INCISION AND DRAINAGE OF WOUND Left 05/19/2019   Procedure: DEBRIDEMENT LEFT GROIN;  Surgeon: Violeta Gelinas, MD;  Location: [facility] OR;  Service: General;  Laterality: Left;   INSERTION OF ILIAC STENT Left 11/30/2017  Left external illiac stent   INSERTION OF ILIAC STENT  11/30/2017   Procedure: Insertion Of Iliac Stent;  Surgeon: Runell Gess, MD;  Location: [facility] INVASIVE CV LAB;  Service: Cardiovascular;;  Left external illiac stent   KNEE ARTHROSCOPY Left    KNEE SURGERY     "ligaments, cartilage; tendon, put a pin in" (11/30/2017)   LEFT HEART CATH Bilateral 07/08/2012   Procedure: LEFT HEART CATH;  Surgeon: Corky Crafts, MD;  Location: [facility] CATH LAB;  Service: Cardiovascular;  Laterality: Bilateral;   LEFT HEART CATH AND  CORONARY ANGIOGRAPHY N/A 10/16/2018   Procedure: LEFT HEART CATH AND CORONARY ANGIOGRAPHY;  Surgeon: Kathleene Hazel, MD;  Location: [facility] INVASIVE CV LAB;  Service: Cardiovascular;  Laterality: N/A;   LEFT HEART CATH AND CORONARY ANGIOGRAPHY N/A 03/11/2019   Procedure: LEFT HEART CATH AND CORONARY ANGIOGRAPHY;  Surgeon: Corky Crafts, MD;  Location: [facility] INVASIVE CV LAB;  Service: Cardiovascular;  Laterality: N/A;   LEFT HEART CATHETERIZATION WITH CORONARY ANGIOGRAM N/A 09/06/2013   STEMI, 2nd ISR LAD. Procedure: LEFT HEART CATHETERIZATION WITH CORONARY ANGIOGRAM;  Surgeon: Corky Crafts, MD;  Location: [facility] CATH LAB;  Service: Cardiovascular;  Laterality: N/A;   LOWER EXTREMITY ANGIOGRAPHY N/A 11/30/2017   Procedure: LOWER EXTREMITY ANGIOGRAPHY;  Surgeon: Runell Gess, MD;  Location: [facility] INVASIVE CV LAB;  Service: Cardiovascular;  Laterality: N/A;   PERCUTANEOUS CORONARY STENT INTERVENTION (PCI-S)  07/08/2012   Procedure: PERCUTANEOUS CORONARY STENT INTERVENTION (PCI-S);  Surgeon: Corky Crafts, MD;  Location: [facility] CATH LAB;  Service: Cardiovascular;;  DES LAD   PERCUTANEOUS CORONARY STENT INTERVENTION (PCI-S) N/A 09/06/2013   Procedure: PERCUTANEOUS CORONARY STENT INTERVENTION (PCI-S);  Surgeon: Corky Crafts, MD;  Location: [facility] CATH LAB;  Service: Cardiovascular;  Laterality: N/A;  Mid LAD 3.0/24mm Promus   THORACENTESIS Right 11/10/2021   Procedure: THORACENTESIS;  Surgeon: Leslye Peer, MD;  Location: [facility] ENDOSCOPY;  Service: Cardiopulmonary;  Laterality: Right;   VIDEO BRONCHOSCOPY WITH ENDOBRONCHIAL ULTRASOUND Right 11/12/2021   Procedure: VIDEO BRONCHOSCOPY WITH ENDOBRONCHIAL ULTRASOUND;  Surgeon: Omar Person, MD;  Location: [facility] ENDOSCOPY;  Service: Pulmonary;  Laterality: Right;   WOUND DEBRIDEMENT Left 05/20/2019   Procedure: DEBRIDEMENT GROIN;  Surgeon: Kinsinger, De Blanch, MD;  Location: [facility] OR;  Service: General;  Laterality: Left;   WRIST FRACTURE SURGERY  Bilateral      A IV Location/Drains/Wounds Patient Lines/Drains/Airways Status     Active Line/Drains/Airways     Name Placement date Placement time Site Days   Peripheral IV 11/02/22 20 G 1" Right;Lateral Forearm 11/02/22  0819  Forearm  less than 1   Wound / Incision (Open or Dehisced) 05/18/19 Other (Comment) Thigh Left;Anterior Ulceration near groin 05/18/19  2225  Thigh  1264            Intake/Output Last 24 hours No intake or output data in the 24 hours ending 11/02/22 1345  Labs/Imaging Results for orders placed or performed during the hospital encounter of 11/02/22 (from the past 48 hour(s))  Lipase, blood     Status: None   Collection Time: 11/02/22  8:22 AM  Result Value Ref Range   Lipase 32 11 - 51 U/L    Comment: Performed at [facility], [address], [place], [place] [address]  Lactic acid, plasma     Status: Abnormal   Collection Time: 11/02/22  8:22 AM  Result Value Ref Range   Lactic Acid, Venous 2.3 (HH) 0.5 - 1.9 mmol/L  Comment: CRITICAL RESULT CALLED TO, READ BACK BY AND VERIFIED WITH EDWARDS,C AT 8:50AM ON 11/02/22 BY [facility] Performed at [facility], [address], [place], [place] [address]   Basic metabolic panel     Status: Abnormal   Collection Time: 11/02/22  8:22 AM  Result Value Ref Range   Sodium 139 135 - 145 mmol/L   Potassium 4.2 3.5 - 5.1 mmol/L   Chloride 105 98 - 111 mmol/L   CO2 26 22 - 32 mmol/L   Glucose, Bld 119 (H) 70 - 99 mg/dL    Comment: Glucose reference range applies only to samples taken after fasting for at least 8 hours.   BUN 19 8 - 23 mg/dL   Creatinine, Ser 5.62 (H) 0.61 - 1.24 mg/dL   Calcium 8.4 (L) 8.9 - 10.3 mg/dL   GFR, Estimated 58 (L) >60 mL/min    Comment: (NOTE) Calculated using the CKD-EPI Creatinine Equation (2021)    Anion gap 8 5 - 15    Comment: Performed at [facility], [address], [place], [place] [address]  Hepatic function panel     Status: Abnormal   Collection Time: 11/02/22   8:22 AM  Result Value Ref Range   Total Protein 8.0 6.5 - 8.1 g/dL   Albumin 2.8 (L) 3.5 - 5.0 g/dL   AST 25 15 - 41 U/L   ALT 16 0 - 44 U/L   Alkaline Phosphatase 95 38 - 126 U/L   Total Bilirubin 1.6 (H) 0.3 - 1.2 mg/dL   Bilirubin, Direct 0.7 (H) 0.0 - 0.2 mg/dL   Indirect Bilirubin 0.9 0.3 - 0.9 mg/dL    Comment: Performed at [facility], [address], [place], [place] [address]  Magnesium     Status: None   Collection Time: 11/02/22  8:22 AM  Result Value Ref Range   Magnesium 2.2 1.7 - 2.4 mg/dL    Comment: Performed at [facility], [address], [place], [place] [address]  Troponin I (High Sensitivity)     Status: None   Collection Time: 11/02/22  8:22 AM  Result Value Ref Range   Troponin I (High Sensitivity) 16 <18 ng/L    Comment: (NOTE) Elevated high sensitivity troponin I (hsTnI) values and significant  changes across serial measurements may suggest ACS but many other  chronic and acute conditions are known to elevate hsTnI results.  Refer to the "Links" section for chest pain algorithms and additional  guidance. Performed at [facility], [address], [place], [place] [address]   Brain natriuretic peptide     Status: Abnormal   Collection Time: 11/02/22  8:22 AM  Result Value Ref Range   B Natriuretic Peptide 1,424.0 (H) 0.0 - 100.0 pg/mL    Comment: Performed at [facility], [address], [place], [place] [address]  CBC with Differential/Platelet     Status: Abnormal   Collection Time: 11/02/22  8:22 AM  Result Value Ref Range   WBC 10.1 4.0 - 10.5 K/uL   RBC 4.15 (L) 4.22 - 5.81 MIL/uL   Hemoglobin 9.3 (L) 13.0 - 17.0 g/dL   HCT 02.7 (L) 25.3 - 66.4 %   MCV 78.6 (L) 80.0 - 100.0 fL   MCH 22.4 (L) 26.0 - 34.0 pg   MCHC 28.5 (L) 30.0 - 36.0 g/dL   RDW 40.3 (H) 47.4 - 25.9 %   Platelets 259 150 - 400 K/uL   nRBC 0.7 (H) 0.0 - 0.2 %   Neutrophils Relative %  82 %   Neutro Abs 8.3 (H) 1.7 - 7.7 K/uL   Lymphocytes Relative 7 %   Lymphs Abs 0.7 0.7 -  4.0 K/uL   Monocytes Relative 5 %   Monocytes Absolute 0.5 0.1 - 1.0 K/uL   Eosinophils Relative 5 %   Eosinophils Absolute 0.6 (H) 0.0 - 0.5 K/uL   Basophils Relative 1 %   Basophils Absolute 0.1 0.0 - 0.1 K/uL   WBC Morphology MORPHOLOGY UNREMARKABLE    RBC Morphology See Note     Comment: ANISOCYTOSIS PRESENT   Smear Review MORPHOLOGY UNREMARKABLE    Immature Granulocytes 0 %   Abs Immature Granulocytes 0.03 0.00 - 0.07 K/uL   Polychromasia PRESENT    Target Cells PRESENT     Comment: Performed at [facility], [address] [place], [place] [address]  Troponin I (High Sensitivity)     Status: Abnormal   Collection Time: 11/02/22 10:19 AM  Result Value Ref Range   Troponin I (High Sensitivity) 18 (H) <18 ng/L    Comment: (NOTE) Elevated high sensitivity troponin I (hsTnI) values and significant  changes across serial measurements may suggest ACS but many other  chronic and acute conditions are known to elevate hsTnI results.  Refer to the "Links" section for chest pain algorithms and additional  guidance. Performed at [facility], [address] [place], [place] [address]   Urinalysis, Routine w reflex microscopic -Urine, Clean Catch     Status: Abnormal   Collection Time: 11/02/22 12:20 PM  Result Value Ref Range   Color, Urine YELLOW YELLOW   APPearance CLEAR CLEAR   Specific Gravity, Urine 1.043 (H) 1.005 - 1.030   pH 5.0 5.0 - 8.0   Glucose, UA NEGATIVE NEGATIVE mg/dL   Hgb urine dipstick SMALL (A) NEGATIVE   Bilirubin Urine NEGATIVE NEGATIVE   Ketones, ur NEGATIVE NEGATIVE mg/dL   Protein, ur >=098 (A) NEGATIVE mg/dL   Nitrite NEGATIVE NEGATIVE   Leukocytes,Ua NEGATIVE NEGATIVE   RBC / HPF 0-5 0 - 5 RBC/hpf   WBC, UA 0-5 0 - 5 WBC/hpf   Bacteria, UA NONE SEEN NONE SEEN   Squamous Epithelial / HPF 0-5 0 - 5 /HPF   Mucus PRESENT     Comment: Performed at [facility], [address] [place], [place] [address]   CT Angio Chest/Abd/Pel for Dissection W and/or Wo  Contrast  Result Date: 11/02/2022 CLINICAL DATA:  Chest pain.  Acute aortic syndrome EXAM: CT ANGIOGRAPHY CHEST, ABDOMEN AND PELVIS TECHNIQUE: Non-contrast CT of the chest was initially obtained. Multidetector CT imaging through the chest, abdomen and pelvis was performed using the standard protocol during bolus administration of intravenous contrast. Multiplanar reconstructed images and MIPs were obtained and reviewed to evaluate the vascular anatomy. RADIATION DOSE REDUCTION: This exam was performed according to the departmental dose-optimization program which includes automated exposure control, adjustment of the mA and/or kV according to patient size and/or use of iterative reconstruction technique. CONTRAST:  OMNIPAQUE IOHEXOL 350 MG/ML SOLN COMPARISON:  CT chest abdomen pelvis 11/07/2021 CT abdomen 11/01/2022 FINDINGS: CTA CHEST FINDINGS Cardiovascular: Noncontrast series demonstrates no intramural hematoma within the thoracic aorta. Contrast series demonstrates no aortic dissection or aneurysm. Great vessels normal. No pericardial fluid. Mediastinum/Nodes: Enlarged mediastinal lymph nodes are not changed from CT 1 year prior. For example LEFT lower paratracheal lymph node measuring 17 mm compared to 16 mm. RIGHT lower paratracheal node measuring 16 mm compares to 20 mm. Lungs/Pleura: Bilateral pleural effusions. Moderate effusion on the  RIGHT and small effusion on the LEFT. Mild diffuse ground-glass density in the lungs suggest mild pulmonary edema. Mild interstitial thickening chest mild interstitial edema. No pneumothorax. No pneumonia. Musculoskeletal: No aggressive osseous lesion. Review of the MIP images confirms the above findings. CTA ABDOMEN AND PELVIS FINDINGS VASCULAR Aorta: Normal caliber aorta without aneurysm, dissection, vasculitis or significant stenosis. Celiac: Patent without evidence of aneurysm, dissection, vasculitis or significant stenosis. SMA: Patent without evidence of  aneurysm, dissection, vasculitis or significant stenosis. Renals: Both renal arteries are patent without evidence of aneurysm, dissection, vasculitis, fibromuscular dysplasia or significant stenosis. IMA: Patent without evidence of aneurysm, dissection, vasculitis or significant stenosis. 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 Reconstitution of the LEFT femoral artery. No interval change. Veins: No obvious venous abnormality within the limitations of this arterial phase study. Review of the MIP images confirms the above findings. NON-VASCULAR Lower chest: Lung bases are clear. Hepatobiliary: No focal hepatic lesion. Normal gallbladder. No biliary duct dilatation. Common bile duct is normal. Pancreas: Pancreas is normal. No ductal dilatation. No pancreatic inflammation. Spleen: Normal spleen Adrenals/urinary tract: Adrenal glands and kidneys are normal. The ureters and bladder normal. Stomach/Bowel: Stomach, small bowel, appendix, and cecum are normal. The colon and rectosigmoid colon are normal. Vascular/Lymphatic: Abdominal aorta is normal caliber. No periportal or retroperitoneal adenopathy. No pelvic adenopathy. Reproductive: Prostate unremarkable Other: No free fluid. Musculoskeletal: No aggressive osseous lesion. Review of the MIP images confirms the above findings. IMPRESSION: CHEST: 1. No evidence of aortic dissection or aneurysm. 2. Bilateral pleural effusions and mild pulmonary edema. 3. Stable mediastinal adenopathy. PELVIS: 1. No evidence of aortic dissection or aneurysm. 2. Chronic occlusion of the LEFT common iliac artery with reconstitution of the LEFT femoral artery. 3. No acute findings in the abdomen pelvis. Electronically Signed   By: Genevive Bi M.D.   On: 11/02/2022 11:25   DG Chest Portable 1 View  Result Date: 11/02/2022 CLINICAL DATA:  Shortness of breath EXAM: PORTABLE CHEST 1 VIEW COMPARISON:  Chest radiograph dated 10/21/2022 FINDINGS: Normal lung volumes. Mild  bilateral interstitial opacities. No pleural effusion or pneumothorax. Similar enlarged cardiomediastinal silhouette. No acute osseous abnormality. IMPRESSION: 1. Mild bilateral interstitial opacities, which may represent pulmonary edema. 2. Similar cardiomegaly. Electronically Signed   By: Agustin Cree M.D.   On: 11/02/2022 09:43   CT ABDOMEN PELVIS W CONTRAST  Result Date: 11/01/2022 CLINICAL DATA:  Abdominal pain and flank pain.  Stone suspected EXAM: CT ABDOMEN AND PELVIS WITH CONTRAST TECHNIQUE: Multidetector CT imaging of the abdomen and pelvis was performed using the standard protocol following bolus administration of intravenous contrast. RADIATION DOSE REDUCTION: This exam was performed according to the departmental dose-optimization program which includes automated exposure control, adjustment of the mA and/or kV according to patient size and/or use of iterative reconstruction technique. CONTRAST:  OMNIPAQUE IOHEXOL 300 MG/ML  SOLN COMPARISON:  None Available. FINDINGS: Lower chest: Moderate pleural effusion Hepatobiliary: No focal hepatic lesion. Two tiny gallstones measuring less than 5 mm each. No gallbladder distension. No biliary duct dilatation. Common bile duct is normal. Pancreas: Pancreas is normal. No ductal dilatation. No pancreatic inflammation. Spleen: Normal spleen Adrenals/urinary tract: Adrenal glands normal. No nephrolithiasis or ureterolithiasis. Perinephric stranding similar comparison CT. No renal obstruction on delayed imaging. Bladder normal. Stomach/Bowel: Stomach, small-bowel and cecum are normal. The appendix is not identified but there is no pericecal inflammation to suggest appendicitis. Insert Vascular/Lymphatic: Abdominal aorta is normal caliber with atherosclerotic calcification. There is no retroperitoneal or periportal lymphadenopathy.  No pelvic lymphadenopathy. Reproductive: Prostate normal Other: small volume of free fluid along the LEFT and RIGHT pericolic gutter.  Musculoskeletal: No aggressive osseous lesion. IMPRESSION: 1. No nephrolithiasis or ureterolithiasis. 2. Perinephric stranding similar to comparison CT. No renal obstruction on delayed imaging. 3. Moderate pleural effusion. 4. Small volume of free fluid along the LEFT and RIGHT pericolic gutter. 5. Cholelithiasis without evidence of cholecystitis. 6.  Aortic Atherosclerosis (ICD10-I70.0). Electronically Signed   By: Genevive Bi M.D.   On: 11/01/2022 16:45   US SCROTUM W/DOPPLER  Result Date: 11/01/2022 CLINICAL DATA:  Testicular pain EXAM: SCROTAL ULTRASOUND DOPPLER ULTRASOUND OF THE TESTICLES TECHNIQUE: Complete ultrasound examination of the testicles, epididymis, and other scrotal structures was performed. Color and spectral Doppler ultrasound were also utilized to evaluate blood flow to the testicles. COMPARISON:  None Available. FINDINGS: Right testicle Measurements: 3.3 x 1.6 x 2.8 cm. No mass or microlithiasis visualized. Left testicle Measurements: 3.4 x 2.2 x 2.2 cm. No mass or microlithiasis visualized. Right epididymis:  Normal in size and appearance. Left epididymis: Epididymal head cyst with internal echogenic debris measuring 1.1 x 1.0 x 1.0 cm. Hydrocele:  Small left hydrocele. Varicocele:  None visualized. Pulsed Doppler interrogation of both testes demonstrates normal low resistance arterial and venous waveforms bilaterally. IMPRESSION: 1. Complex left epididymal head cyst measuring up to 1.1 cm. 2. Small left hydrocele. Electronically Signed   By: Allegra Lai M.D.   On: 11/01/2022 11:46    Pending Labs Unresulted Labs (From admission, onward)     Start     Ordered   11/03/22 0500  Basic metabolic panel  Tomorrow morning,   R        11/02/22 1304   11/03/22 0500  Brain natriuretic peptide  Tomorrow morning,   R        11/02/22 1304            Vitals/Pain Today's Vitals   11/02/22 1000 11/02/22 1219 11/02/22 1230 11/02/22 1245  BP:   (!) 141/97 131/89  Pulse:   86  83  Resp:   (!) 23 19  Temp:  97.9 F (36.6 C)    TempSrc:  Oral    SpO2:   98% 100%  PainSc: 5        Isolation Precautions No active isolations  Medications Medications  HYDROmorphone (DILAUDID) injection 0.5 mg (0.5 mg Intravenous Given 11/02/22 0820)  iohexol (OMNIPAQUE) 350 MG/ML injection 100 mL (100 mLs Intravenous Contrast Given 11/02/22 0935)  furosemide (LASIX) injection 40 mg (40 mg Intravenous Given 11/02/22 1236)  methocarbamol (ROBAXIN) tablet 1,000 mg (1,000 mg Oral Given 11/02/22 1253)    Mobility walks with device     Focused Assessments Pulmonary Assessment Handoff:  Lung sounds:   O2 Device: Nasal Cannula O2 Flow Rate (L/min): 4 L/min    R Recommendations: See Admitting Provider Note  Report given to:   Additional Notes:

## 2022-11-02 NOTE — ED Notes (Signed)
Date and time results received: 11/02/22 0851 (use smartphrase ".now" to insert current time)  Test: lactic acid Critical Value: 2.3  Name of Provider Notified: Dr. Durwin Nora  Orders Received? Or Actions Taken?: Orders Received - See Orders for details

## 2022-11-02 NOTE — ED Provider Notes (Signed)
[place] EMERGENCY DEPARTMENT AT [facility] Provider Note   CSN: [id number] Arrival date & time: 11/02/22  4098     History  Chief Complaint  Patient presents with   Abdominal Pain    [name] is a 66 y.o. male.   Abdominal Pain Associated symptoms: nausea and shortness of breath   Patient presents for abdominal pain, testicle pain, shortness of breath.  Medical history includes CAD, CKD, DM2, HTN, GERD, HLD, sleep apnea.  He was seen in the ED yesterday for testicle pain that radiated into his abdomen.  This pain started several days ago.  He underwent scrotal ultrasound which showed complex left epididymal head cyst as well as a small left hydrocele.  He underwent CT imaging which showed perinephric stranding similar to prior CT scans, moderate pleural effusion, and small volume free fluid along left and right paracolic gutter.  His lab work from yesterday was notable for a very slight leukocytosis.  There was no evidence of urine infection.  Since his recent visit, he has had ongoing pain.  He now describes it as radiating to the posterior right thoracic back.  He has felt short of breath.  He is not on oxygen at baseline.  He was recently admitted for CHF exacerbation and underwent diuresis.  He is prescribed 40 mg of Lasix daily.  He is unsure if he takes this.  He has not taken anything for pain since his ED visit yesterday.  He does endorse some mild nausea.     Home Medications Prior to Admission medications   Medication Sig Start Date End Date Taking? Authorizing Provider  ASPIRIN LOW DOSE 81 MG tablet Take 81 mg by mouth daily as needed for moderate pain or mild pain. 10/14/22   [provider]  atorvastatin (LIPITOR) 40 MG tablet Take 1 tablet (40 mg total) by mouth at bedtime. 10/25/22 11/24/22  Vassie Loll, MD  carvedilol (COREG) 3.125 MG tablet Take 1 tablet (3.125 mg total) by mouth 2 (two) times daily with a meal. 10/25/22 11/24/22   Vassie Loll, MD  empagliflozin (JARDIANCE) 10 MG TABS tablet Take 1 tablet (10 mg total) by mouth daily. 10/26/22   Vassie Loll, MD  furosemide (LASIX) 40 MG tablet Take 1 tablet (40 mg total) by mouth daily. 10/25/22 11/24/22  Vassie Loll, MD  gabapentin (NEURONTIN) 300 MG capsule Take 1 capsule (300 mg total) by mouth 3 (three) times daily. 10/25/22   Vassie Loll, MD  nicotine (NICODERM CQ - DOSED IN MG/24 HOURS) 21 mg/24hr patch Place 1 patch (21 mg total) onto the skin daily. 10/26/22   Vassie Loll, MD  pantoprazole (PROTONIX) 40 MG tablet Take 1 tablet (40 mg total) by mouth daily. 10/26/22   Vassie Loll, MD  sildenafil (VIAGRA) 50 MG tablet Take 50 mg by mouth daily as needed. 08/26/22   [provider]      Allergies    Clopidogrel    Review of Systems   Review of Systems  Respiratory:  Positive for shortness of breath.   Gastrointestinal:  Positive for abdominal pain and nausea.  Genitourinary:  Positive for testicular pain.  Musculoskeletal:  Positive for back pain.  All other systems reviewed and are negative.   Physical Exam Updated Vital Signs BP (!) 141/97   Pulse 86   Temp 97.9 F (36.6 C) (Oral)   Resp (!) 23   SpO2 98%  Physical Exam Vitals and nursing note reviewed.  Constitutional:  General: He is not in acute distress.    Appearance: He is well-developed. He is not toxic-appearing or diaphoretic.  HENT:     Head: Normocephalic and atraumatic.     Mouth/Throat:     Mouth: Mucous membranes are moist.  Eyes:     Conjunctiva/sclera: Conjunctivae normal.  Cardiovascular:     Rate and Rhythm: Normal rate and regular rhythm.  Pulmonary:     Effort: Pulmonary effort is normal. No respiratory distress.     Breath sounds: Decreased breath sounds present. No wheezing, rhonchi or rales.  Abdominal:     Palpations: Abdomen is soft.     Tenderness: There is abdominal tenderness in the right upper quadrant and right lower quadrant.  There is no guarding or rebound.  Genitourinary:    Testes: Cremasteric reflex is present.        Right: Tenderness present. Swelling not present.        Left: Tenderness present. Swelling not present.  Musculoskeletal:        General: No swelling.     Cervical back: Neck supple.  Skin:    General: Skin is warm and dry.     Coloration: Skin is not cyanotic or jaundiced.  Neurological:     General: No focal deficit present.     Mental Status: He is alert and oriented to person, place, and time.  Psychiatric:        Mood and Affect: Mood normal.        Behavior: Behavior normal.     ED Results / Procedures / Treatments   Labs (all labs ordered are listed, but only abnormal results are displayed) Labs Reviewed  URINALYSIS, ROUTINE W REFLEX MICROSCOPIC - Abnormal; Notable for the following components:      Result Value   Specific Gravity, Urine 1.043 (*)    Hgb urine dipstick SMALL (*)    Protein, ur >=300 (*)    All other components within normal limits  LACTIC ACID, PLASMA - Abnormal; Notable for the following components:   Lactic Acid, Venous 2.3 (*)    All other components within normal limits  BASIC METABOLIC PANEL - Abnormal; Notable for the following components:   Glucose, Bld 119 (*)    Creatinine, Ser 1.34 (*)    Calcium 8.4 (*)    GFR, Estimated 58 (*)    All other components within normal limits  HEPATIC FUNCTION PANEL - Abnormal; Notable for the following components:   Albumin 2.8 (*)    Total Bilirubin 1.6 (*)    Bilirubin, Direct 0.7 (*)    All other components within normal limits  BRAIN NATRIURETIC PEPTIDE - Abnormal; Notable for the following components:   B Natriuretic Peptide 1,424.0 (*)    All other components within normal limits  CBC WITH DIFFERENTIAL/PLATELET - Abnormal; Notable for the following components:   RBC 4.15 (*)    Hemoglobin 9.3 (*)    HCT 32.6 (*)    MCV 78.6 (*)    MCH 22.4 (*)    MCHC 28.5 (*)    RDW 22.0 (*)    nRBC 0.7 (*)     Neutro Abs 8.3 (*)    Eosinophils Absolute 0.6 (*)    All other components within normal limits  TROPONIN I (HIGH SENSITIVITY) - Abnormal; Notable for the following components:   Troponin I (High Sensitivity) 18 (*)    All other components within normal limits  LIPASE, BLOOD  MAGNESIUM  TROPONIN I (HIGH SENSITIVITY)  EKG EKG Interpretation Date/Time:  Wednesday November 02 2022 07:59:01 EDT Ventricular Rate:  76 PR Interval:  216 QRS Duration:  85 QT Interval:  472 QTC Calculation: 531 R Axis:   257  Text Interpretation: Sinus rhythm Borderline prolonged PR interval Left anterior fascicular block Abnormal lateral Q waves Anterior infarct, old Prolonged QT interval Confirmed by Gloris Manchester [phone]) on 11/02/2022 9:39:48 AM  Radiology CT Angio Chest/Abd/Pel for Dissection W and/or Wo Contrast  Result Date: 11/02/2022 CLINICAL DATA:  Chest pain.  Acute aortic syndrome EXAM: CT ANGIOGRAPHY CHEST, ABDOMEN AND PELVIS TECHNIQUE: Non-contrast CT of the chest was initially obtained. Multidetector CT imaging through the chest, abdomen and pelvis was performed using the standard protocol during bolus administration of intravenous contrast. Multiplanar reconstructed images and MIPs were obtained and reviewed to evaluate the vascular anatomy. RADIATION DOSE REDUCTION: This exam was performed according to the departmental dose-optimization program which includes automated exposure control, adjustment of the mA and/or kV according to patient size and/or use of iterative reconstruction technique. CONTRAST:  OMNIPAQUE IOHEXOL 350 MG/ML SOLN COMPARISON:  CT chest abdomen pelvis 11/07/2021 CT abdomen 11/01/2022 FINDINGS: CTA CHEST FINDINGS Cardiovascular: Noncontrast series demonstrates no intramural hematoma within the thoracic aorta. Contrast series demonstrates no aortic dissection or aneurysm. Great vessels normal. No pericardial fluid. Mediastinum/Nodes: Enlarged mediastinal lymph nodes are not  changed from CT 1 year prior. For example LEFT lower paratracheal lymph node measuring 17 mm compared to 16 mm. RIGHT lower paratracheal node measuring 16 mm compares to 20 mm. Lungs/Pleura: Bilateral pleural effusions. Moderate effusion on the RIGHT and small effusion on the LEFT. Mild diffuse ground-glass density in the lungs suggest mild pulmonary edema. Mild interstitial thickening chest mild interstitial edema. No pneumothorax. No pneumonia. Musculoskeletal: No aggressive osseous lesion. Review of the MIP images confirms the above findings. CTA ABDOMEN AND PELVIS FINDINGS VASCULAR Aorta: Normal caliber aorta without aneurysm, dissection, vasculitis or significant stenosis. Celiac: Patent without evidence of aneurysm, dissection, vasculitis or significant stenosis. SMA: Patent without evidence of aneurysm, dissection, vasculitis or significant stenosis. Renals: Both renal arteries are patent without evidence of aneurysm, dissection, vasculitis, fibromuscular dysplasia or significant stenosis. IMA: Patent without evidence of aneurysm, dissection, vasculitis or significant stenosis. 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 Reconstitution of the LEFT femoral artery. No interval change. Veins: No obvious venous abnormality within the limitations of this arterial phase study. Review of the MIP images confirms the above findings. NON-VASCULAR Lower chest: Lung bases are clear. Hepatobiliary: No focal hepatic lesion. Normal gallbladder. No biliary duct dilatation. Common bile duct is normal. Pancreas: Pancreas is normal. No ductal dilatation. No pancreatic inflammation. Spleen: Normal spleen Adrenals/urinary tract: Adrenal glands and kidneys are normal. The ureters and bladder normal. Stomach/Bowel: Stomach, small bowel, appendix, and cecum are normal. The colon and rectosigmoid colon are normal. Vascular/Lymphatic: Abdominal aorta is normal caliber. No periportal or retroperitoneal adenopathy. No  pelvic adenopathy. Reproductive: Prostate unremarkable Other: No free fluid. Musculoskeletal: No aggressive osseous lesion. Review of the MIP images confirms the above findings. IMPRESSION: CHEST: 1. No evidence of aortic dissection or aneurysm. 2. Bilateral pleural effusions and mild pulmonary edema. 3. Stable mediastinal adenopathy. PELVIS: 1. No evidence of aortic dissection or aneurysm. 2. Chronic occlusion of the LEFT common iliac artery with reconstitution of the LEFT femoral artery. 3. No acute findings in the abdomen pelvis. Electronically Signed   By: Genevive Bi M.D.   On: 11/02/2022 11:25   DG Chest Portable 1 View  Result Date:  11/02/2022 CLINICAL DATA:  Shortness of breath EXAM: PORTABLE CHEST 1 VIEW COMPARISON:  Chest radiograph dated 10/21/2022 FINDINGS: Normal lung volumes. Mild bilateral interstitial opacities. No pleural effusion or pneumothorax. Similar enlarged cardiomediastinal silhouette. No acute osseous abnormality. IMPRESSION: 1. Mild bilateral interstitial opacities, which may represent pulmonary edema. 2. Similar cardiomegaly. Electronically Signed   By: Agustin Cree M.D.   On: 11/02/2022 09:43   CT ABDOMEN PELVIS W CONTRAST  Result Date: 11/01/2022 CLINICAL DATA:  Abdominal pain and flank pain.  Stone suspected EXAM: CT ABDOMEN AND PELVIS WITH CONTRAST TECHNIQUE: Multidetector CT imaging of the abdomen and pelvis was performed using the standard protocol following bolus administration of intravenous contrast. RADIATION DOSE REDUCTION: This exam was performed according to the departmental dose-optimization program which includes automated exposure control, adjustment of the mA and/or kV according to patient size and/or use of iterative reconstruction technique. CONTRAST:  OMNIPAQUE IOHEXOL 300 MG/ML  SOLN COMPARISON:  None Available. FINDINGS: Lower chest: Moderate pleural effusion Hepatobiliary: No focal hepatic lesion. Two tiny gallstones measuring less than 5 mm each.  No gallbladder distension. No biliary duct dilatation. Common bile duct is normal. Pancreas: Pancreas is normal. No ductal dilatation. No pancreatic inflammation. Spleen: Normal spleen Adrenals/urinary tract: Adrenal glands normal. No nephrolithiasis or ureterolithiasis. Perinephric stranding similar comparison CT. No renal obstruction on delayed imaging. Bladder normal. Stomach/Bowel: Stomach, small-bowel and cecum are normal. The appendix is not identified but there is no pericecal inflammation to suggest appendicitis. Insert Vascular/Lymphatic: Abdominal aorta is normal caliber with atherosclerotic calcification. There is no retroperitoneal or periportal lymphadenopathy. No pelvic lymphadenopathy. Reproductive: Prostate normal Other: small volume of free fluid along the LEFT and RIGHT pericolic gutter. Musculoskeletal: No aggressive osseous lesion. IMPRESSION: 1. No nephrolithiasis or ureterolithiasis. 2. Perinephric stranding similar to comparison CT. No renal obstruction on delayed imaging. 3. Moderate pleural effusion. 4. Small volume of free fluid along the LEFT and RIGHT pericolic gutter. 5. Cholelithiasis without evidence of cholecystitis. 6.  Aortic Atherosclerosis (ICD10-I70.0). Electronically Signed   By: Genevive Bi M.D.   On: 11/01/2022 16:45   US SCROTUM W/DOPPLER  Result Date: 11/01/2022 CLINICAL DATA:  Testicular pain EXAM: SCROTAL ULTRASOUND DOPPLER ULTRASOUND OF THE TESTICLES TECHNIQUE: Complete ultrasound examination of the testicles, epididymis, and other scrotal structures was performed. Color and spectral Doppler ultrasound were also utilized to evaluate blood flow to the testicles. COMPARISON:  None Available. FINDINGS: Right testicle Measurements: 3.3 x 1.6 x 2.8 cm. No mass or microlithiasis visualized. Left testicle Measurements: 3.4 x 2.2 x 2.2 cm. No mass or microlithiasis visualized. Right epididymis:  Normal in size and appearance. Left epididymis: Epididymal head cyst with  internal echogenic debris measuring 1.1 x 1.0 x 1.0 cm. Hydrocele:  Small left hydrocele. Varicocele:  None visualized. Pulsed Doppler interrogation of both testes demonstrates normal low resistance arterial and venous waveforms bilaterally. IMPRESSION: 1. Complex left epididymal head cyst measuring up to 1.1 cm. 2. Small left hydrocele. Electronically Signed   By: Allegra Lai M.D.   On: 11/01/2022 11:46    Procedures Procedures    Medications Ordered in ED Medications  methocarbamol (ROBAXIN) tablet 1,000 mg (has no administration in time range)  HYDROmorphone (DILAUDID) injection 0.5 mg (0.5 mg Intravenous Given 11/02/22 0820)  iohexol (OMNIPAQUE) 350 MG/ML injection 100 mL (100 mLs Intravenous Contrast Given 11/02/22 0935)  furosemide (LASIX) injection 40 mg (40 mg Intravenous Given 11/02/22 1236)    ED Course/ Medical Decision Making/ A&P  Medical Decision Making Amount and/or Complexity of Data Reviewed Labs: ordered. Radiology: ordered.  Risk Prescription drug management.   This patient presents to the ED for concern of pain throughout back, right flank, and testicles, this involves an extensive number of treatment options, and is a complaint that carries with it a high risk of complications and morbidity.  The differential diagnosis includes nephrolithiasis, UTI, colitis, pneumonia, PE, aortic dissection, retroperitoneal hemorrhage, anasarca   Co morbidities that complicate the patient evaluation  CAD, CKD, DM2, HTN, GERD, HLD, sleep apnea   Additional history obtained:  Additional history obtained from N/A External records from outside source obtained and reviewed including EMR   Lab Tests:  I Ordered, and personally interpreted labs.  The pertinent results include: Baseline anemia, no leukocytosis, normal troponin, no evidence of UTI, elevated BNP consistent with CHF exacerbation   Imaging Studies ordered:  I ordered imaging  studies including chest x-ray, CTA dissection study I independently visualized and interpreted imaging which showed no acute findings.  There is chronic occlusion of left common iliac artery with reconstitution of left femoral artery. I agree with the radiologist interpretation   Cardiac Monitoring: / EKG:  The patient was maintained on a cardiac monitor.  I personally viewed and interpreted the cardiac monitored which showed an underlying rhythm of: Sinus rhythm  Problem List / ED Course / Critical interventions / Medication management  Patient presents for ongoing pain in areas of testicle, right side of abdomen, and right thoracic back.  This has been going on for the past several days.  He was seen in the ED yesterday and had reassuring workup.  His pain has continued.  Today, he does endorse mild shortness of breath.  He was found to be mildly hypoxic on arrival with SpO2 of 88% on room air.  He was placed on 2 L of supplemental oxygen.  He does have a history of CHF and did recently undergo a hospital admission for IV diuresis.  He is prescribed 40 mg of Lasix but is unsure if he takes this.  On lung auscultation, he does have diminished breath sounds.  No wheezing is appreciated.  Dilaudid was ordered for analgesia.  Workup was initiated.  Given his symptoms of thoracic back pain and pain radiating into his legs, and concern of dissection.  A CTA dissection study was ordered.  I discussed this with radiology who reviewed his imaging from yesterday.  Given his new symptoms, it was agreed that it was prudent to get the study today.  Dissection study did not show any acute findings.  He does have chronic occlusion of left common iliac artery with distal reconstitution.  His lab work today is notable for an elevated BNP.  He does have a recent hospitalization for CHF exacerbation.  Given his hypoxia today, I suspect recurrence of this.  IV Lasix was ordered.  When put back on room air, he remained  hypoxic at 85%.  He was placed back on supplemental oxygen.  Patient to be admitted for further management. I ordered medication including Dilaudid and Robaxin for analgesia; Lasix for diuresis Reevaluation of the patient after these medicines showed that the patient improved I have reviewed the patients home medicines and have made adjustments as needed   Social Determinants of Health:  Has PCP  CRITICAL CARE Performed by: Gloris Manchester   Total critical care time: 32 minutes  Critical care time was exclusive of separately billable procedures and treating other patients.  Critical care was  necessary to treat or prevent imminent or life-threatening deterioration.  Critical care was time spent personally by me on the following activities: development of treatment plan with patient and/or surrogate as well as nursing, discussions with consultants, evaluation of patient's response to treatment, examination of patient, obtaining history from patient or surrogate, ordering and performing treatments and interventions, ordering and review of laboratory studies, ordering and review of radiographic studies, pulse oximetry and re-evaluation of patient's condition.         Final Clinical Impression(s) / ED Diagnoses Final diagnoses:  Acute on chronic congestive heart failure, unspecified heart failure type (HCC)  Acute respiratory failure with hypoxia [facility])    Rx / DC Orders ED Discharge Orders     None         Gloris Manchester, MD 11/02/22 1254

## 2022-11-02 NOTE — H&P (Signed)
History and Physical    Patient: [name] QMV:[id number] DOB: May 21, 1955 DOA: 11/02/2022 DOS: the patient was seen and examined on 11/02/2022 PCP: Benetta Spar, MD  Patient coming from: Home  Chief Complaint:  Chief Complaint  Patient presents with   Abdominal Pain   HPI: [name] is a 67 y.o. 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 He had been discharged 10/15 after an admission for acute CHF and noted gradual increasing pain in the scrotum, swelling of the scrotum, and leg swelling associated with dyspnea that worsened with ambulation. No fevers, chills. He had presented to the ED 10/22 where work up was reassuring. Scrotal ultrasound showed complex left epididymal head cyst as well as a small left hydrocele. Urine microscopy negative. CT showed perinephric stranding similar to prior CT scans, moderate pleural effusion, and small volume free fluid along left and right paracolic gutter. His lab work from yesterday was notable for a very slight leukocytosis. He was discharged but returned when symptoms continued today, also with some mild nausea. He was afebrile and hypertensive, hypoxic requiring 4L O2. CTA C/A/P was performed and ruled out dissection. There was R > L pleural effusions with pulmonary edema, and chronic left iliac artery occlusion with distal reconstitution. Leukocytosis has resolved, BNP elevated, lactic acid elevated. For concern of acute CHF, IV lasix was given and admission requested.   Review of Systems: As mentioned in the history of present illness. All other systems reviewed and are negative. Past Medical History:  Diagnosis Date   Bulging lumbar disc    CAD (coronary artery disease) [phone]   a. prior LAD stenting. b. s/p DES to [facility] 08/2015. c. 04/2016 Cardiac cath at  [facility]. Patent stent in the PLAD and RCA. Diffuse dLAD, OM2, and  RPDA disease. d. DES to PDA and distal RCA 03/2019    Chronic lower back pain    CKD (chronic kidney disease), stage II    Cocaine abuse (HCC)    Cyst of epididymis    DM2 (diabetes mellitus, type 2) (HCC)    Essential hypertension    GERD (gastroesophageal reflux disease)    Headache    History of pneumonia    Hyperlipidemia    Ischemic cardiomyopathy    LV (left ventricular) mural thrombus    Sleep apnea    Past Surgical History:  Procedure Laterality Date   APPENDECTOMY     BRONCHIAL NEEDLE ASPIRATION BIOPSY  11/12/2021   Procedure: BRONCHIAL NEEDLE ASPIRATION BIOPSIES;  Surgeon: Omar Person, MD;  Location: [facility] ENDOSCOPY;  Service: Pulmonary;;   CARDIAC CATHETERIZATION N/A 09/07/2015   Procedure: Left Heart Cath and Coronary Angiography;  Surgeon: Marykay Lex, MD;  Location: [facility] INVASIVE CV LAB;  Service: Cardiovascular;  Laterality: N/A;   CARDIAC CATHETERIZATION N/A 09/07/2015   Procedure: Coronary Stent Intervention;  Surgeon: Marykay Lex, MD;  Location: [facility] INVASIVE CV LAB;  Service: Cardiovascular;  Laterality: N/A;   CORONARY ANGIOGRAM  09/07/13   residual RCA and OM disease   CORONARY ANGIOPLASTY WITH STENT PLACEMENT     CORONARY STENT INTERVENTION N/A 10/16/2018   Procedure: CORONARY STENT INTERVENTION;  Surgeon: Kathleene Hazel, MD;  Location: [facility] INVASIVE CV LAB;  Service: Cardiovascular;  Laterality: N/A;   CORONARY STENT INTERVENTION N/A 03/11/2019   Procedure: CORONARY STENT INTERVENTION;  Surgeon:  Corky Crafts, MD;  Location: [facility] INVASIVE CV LAB;  Service: Cardiovascular;  Laterality: N/A;   FRACTURE SURGERY     INCISION AND DRAINAGE OF WOUND Left 05/19/2019   Procedure: DEBRIDEMENT LEFT GROIN;  Surgeon: Violeta Gelinas, MD;  Location: [facility] OR;  Service: General;  Laterality: Left;   INSERTION OF ILIAC STENT Left 11/30/2017   Left external illiac stent   INSERTION OF ILIAC  STENT  11/30/2017   Procedure: Insertion Of Iliac Stent;  Surgeon: Runell Gess, MD;  Location: [facility] INVASIVE CV LAB;  Service: Cardiovascular;;  Left external illiac stent   KNEE ARTHROSCOPY Left    KNEE SURGERY     "ligaments, cartilage; tendon, put a pin in" (11/30/2017)   LEFT HEART CATH Bilateral 07/08/2012   Procedure: LEFT HEART CATH;  Surgeon: Corky Crafts, MD;  Location: [facility] CATH LAB;  Service: Cardiovascular;  Laterality: Bilateral;   LEFT HEART CATH AND CORONARY ANGIOGRAPHY N/A 10/16/2018   Procedure: LEFT HEART CATH AND CORONARY ANGIOGRAPHY;  Surgeon: Kathleene Hazel, MD;  Location: [facility] INVASIVE CV LAB;  Service: Cardiovascular;  Laterality: N/A;   LEFT HEART CATH AND CORONARY ANGIOGRAPHY N/A 03/11/2019   Procedure: LEFT HEART CATH AND CORONARY ANGIOGRAPHY;  Surgeon: Corky Crafts, MD;  Location: [facility] INVASIVE CV LAB;  Service: Cardiovascular;  Laterality: N/A;   LEFT HEART CATHETERIZATION WITH CORONARY ANGIOGRAM N/A 09/06/2013   STEMI, 2nd ISR LAD. Procedure: LEFT HEART CATHETERIZATION WITH CORONARY ANGIOGRAM;  Surgeon: Corky Crafts, MD;  Location: [facility] CATH LAB;  Service: Cardiovascular;  Laterality: N/A;   LOWER EXTREMITY ANGIOGRAPHY N/A 11/30/2017   Procedure: LOWER EXTREMITY ANGIOGRAPHY;  Surgeon: Runell Gess, MD;  Location: [facility] INVASIVE CV LAB;  Service: Cardiovascular;  Laterality: N/A;   PERCUTANEOUS CORONARY STENT INTERVENTION (PCI-S)  07/08/2012   Procedure: PERCUTANEOUS CORONARY STENT INTERVENTION (PCI-S);  Surgeon: Corky Crafts, MD;  Location: [facility] CATH LAB;  Service: Cardiovascular;;  DES LAD   PERCUTANEOUS CORONARY STENT INTERVENTION (PCI-S) N/A 09/06/2013   Procedure: PERCUTANEOUS CORONARY STENT INTERVENTION (PCI-S);  Surgeon: Corky Crafts, MD;  Location: [facility] CATH LAB;  Service: Cardiovascular;  Laterality: N/A;  Mid LAD 3.0/24mm Promus   THORACENTESIS Right 11/10/2021   Procedure: THORACENTESIS;  Surgeon: Leslye Peer, MD;   Location: [facility] ENDOSCOPY;  Service: Cardiopulmonary;  Laterality: Right;   VIDEO BRONCHOSCOPY WITH ENDOBRONCHIAL ULTRASOUND Right 11/12/2021   Procedure: VIDEO BRONCHOSCOPY WITH ENDOBRONCHIAL ULTRASOUND;  Surgeon: Omar Person, MD;  Location: [facility] ENDOSCOPY;  Service: Pulmonary;  Laterality: Right;   WOUND DEBRIDEMENT Left 05/20/2019   Procedure: DEBRIDEMENT GROIN;  Surgeon: Kinsinger, De Blanch, MD;  Location: [facility] OR;  Service: General;  Laterality: Left;   WRIST FRACTURE SURGERY Bilateral    Social History:  reports that he has been smoking cigarettes. He has a 24 pack-year smoking history. He has never used smokeless tobacco. He reports that he does not currently use alcohol. He reports that he does not use drugs.  Allergies  Allergen Reactions   Clopidogrel Other (See Comments)    Drowsy, Skin irritation    Family History  Problem Relation Age of Onset   Hypertension Mother    Diabetes Mother     Prior to Admission medications   Medication Sig Start Date End Date Taking? Authorizing Provider  ASPIRIN LOW DOSE 81 MG tablet Take 81 mg by mouth daily as needed for moderate pain or mild pain. 10/14/22   [provider]  atorvastatin (LIPITOR) 40 MG tablet Take 1 tablet (40  mg total) by mouth at bedtime. 10/25/22 11/24/22  Vassie Loll, MD  carvedilol (COREG) 3.125 MG tablet Take 1 tablet (3.125 mg total) by mouth 2 (two) times daily with a meal. 10/25/22 11/24/22  Vassie Loll, MD  empagliflozin (JARDIANCE) 10 MG TABS tablet Take 1 tablet (10 mg total) by mouth daily. 10/26/22   Vassie Loll, MD  furosemide (LASIX) 40 MG tablet Take 1 tablet (40 mg total) by mouth daily. 10/25/22 11/24/22  Vassie Loll, MD  gabapentin (NEURONTIN) 300 MG capsule Take 1 capsule (300 mg total) by mouth 3 (three) times daily. 10/25/22   Vassie Loll, MD  nicotine (NICODERM CQ - DOSED IN MG/24 HOURS) 21 mg/24hr patch Place 1 patch (21 mg total) onto the skin daily. 10/26/22   Vassie Loll,  MD  pantoprazole (PROTONIX) 40 MG tablet Take 1 tablet (40 mg total) by mouth daily. 10/26/22   Vassie Loll, MD  sildenafil (VIAGRA) 50 MG tablet Take 50 mg by mouth daily as needed. 08/26/22   [provider]    Physical Exam: Vitals:   11/02/22 1219 11/02/22 1230 11/02/22 1245 11/02/22 1444  BP:  (!) 141/97 131/89 127/82  Pulse:  86 83 86  Resp:  (!) 23 19 16   Temp: 97.9 F (36.6 C)   98.5 F (36.9 C)  TempSrc: Oral     SpO2:  98% 100% 100%  Gen: Chronically ill-appearing male in no distress sitting upright Pulm: Diminished R > L with crackles, no wheezes  CV: RRR, no MRG, pitting L>RLE edema to thighs, +JVD GI: Soft, NT, ND, +BS Neuro: Alert and oriented. No new focal deficits. Ext: Warm, no deformities Skin: No acute rashes, lesions or ulcers on visualized skin   Data Reviewed: Troponin 16 > 18 LA 2.3 BNP 1,424 WBC 10.1, hgb 9.3, plt 259k Cr 1.34 (1.42), albumin 2.8, TBili 1.6 (2.3) UA micro: No WBCs, RBCs, bacteria. >300protein, SpGrav 1.043.   CT chest: Bilateral pleural effusions. Moderate effusion on the RIGHT and small effusion on the LEFT. Mild diffuse ground-glass density in the lungs suggest mild pulmonary edema. Mild interstitial thickening chest mild interstitial edema.  Assessment and Plan: Acute hypoxic respiratory failure due to acute on chronic HFrEF, pleural effusions:  - Diurese as below, wean oxygen as tolerated.   Acute on chronic combined HFrEF and RV failure:  - IV lasix 40mg  given today. With concern for renal impairment (received contrast yesterday for CT and again today for CTA C/A/P) and stage II CKD, will aim for gentle diuresis. Monitor I/O, weights, BMP.  - Can continue jardiance  OSA:  - CPAP qHS  Scrotal pain: U/S reassuring, ?if hydrocele from overload contributing. Also note Dx from PCP "scrotal pain, chronic, bilateral" a year ago.   Peripheral neuropathy:  - Continue gabapentin  CAD, HTN: No chest pain.  - Continue  ASA, statin, BB  PAD: Chronic left common iliac occlusion on CTA with distal reconstution.   T2DM:  - SSI  Tobacco use:  - Nicotine patch - Cessation counseling  Obesity: BMI 36.    Advance Care Planning: Full code  Consults: None  Family Communication: None at bedside  Severity of Illness: The appropriate patient status for this patient is OBSERVATION. Observation status is judged to be reasonable and necessary in order to provide the required intensity of service to ensure the patient's safety. The patient's presenting symptoms, physical exam findings, and initial radiographic and laboratory data in the context of their medical condition is felt to place them at  decreased risk for further clinical deterioration. Furthermore, it is anticipated that the patient will be medically stable for discharge from the hospital within 2 midnights of admission.   Author: Tyrone Nine, MD 11/02/2022 4:06 PM  For on call review [email or web address].

## 2022-11-02 NOTE — ED Notes (Signed)
Pt o2 sats 85%RA , Pt placed on [place] @4L , O2 SATS 96%

## 2022-11-02 NOTE — Progress Notes (Signed)
Upon this RN's shift assessment patient is lying on abdomen, denies pain, appears comfortable, has nasal cannula in place, and answers all orientation questions appropriately. He is aware of need for urine sample. He denies further needs at this time. Kellogg RN

## 2022-11-03 ENCOUNTER — Inpatient Hospital Stay (HOSPITAL_COMMUNITY): Payer: No Typology Code available for payment source

## 2022-11-03 ENCOUNTER — Encounter (HOSPITAL_COMMUNITY): Payer: Self-pay | Admitting: Family Medicine

## 2022-11-03 DIAGNOSIS — N179 Acute kidney failure, unspecified: Secondary | ICD-10-CM | POA: Diagnosis not present

## 2022-11-03 DIAGNOSIS — D122 Benign neoplasm of ascending colon: Secondary | ICD-10-CM | POA: Diagnosis not present

## 2022-11-03 DIAGNOSIS — G936 Cerebral edema: Secondary | ICD-10-CM | POA: Diagnosis present

## 2022-11-03 DIAGNOSIS — I5043 Acute on chronic combined systolic (congestive) and diastolic (congestive) heart failure: Secondary | ICD-10-CM | POA: Diagnosis present

## 2022-11-03 DIAGNOSIS — D126 Benign neoplasm of colon, unspecified: Secondary | ICD-10-CM | POA: Diagnosis not present

## 2022-11-03 DIAGNOSIS — F1413 Cocaine abuse, unspecified with withdrawal: Secondary | ICD-10-CM | POA: Diagnosis present

## 2022-11-03 DIAGNOSIS — I82522 Chronic embolism and thrombosis of left iliac vein: Secondary | ICD-10-CM | POA: Diagnosis present

## 2022-11-03 DIAGNOSIS — Z95828 Presence of other vascular implants and grafts: Secondary | ICD-10-CM | POA: Diagnosis not present

## 2022-11-03 DIAGNOSIS — D649 Anemia, unspecified: Secondary | ICD-10-CM | POA: Diagnosis not present

## 2022-11-03 DIAGNOSIS — L97521 Non-pressure chronic ulcer of other part of left foot limited to breakdown of skin: Secondary | ICD-10-CM | POA: Diagnosis not present

## 2022-11-03 DIAGNOSIS — I2510 Atherosclerotic heart disease of native coronary artery without angina pectoris: Secondary | ICD-10-CM | POA: Diagnosis not present

## 2022-11-03 DIAGNOSIS — E1122 Type 2 diabetes mellitus with diabetic chronic kidney disease: Secondary | ICD-10-CM | POA: Diagnosis present

## 2022-11-03 DIAGNOSIS — K2990 Gastroduodenitis, unspecified, without bleeding: Secondary | ICD-10-CM | POA: Diagnosis not present

## 2022-11-03 DIAGNOSIS — R195 Other fecal abnormalities: Secondary | ICD-10-CM | POA: Diagnosis not present

## 2022-11-03 DIAGNOSIS — D509 Iron deficiency anemia, unspecified: Secondary | ICD-10-CM | POA: Diagnosis not present

## 2022-11-03 DIAGNOSIS — E1165 Type 2 diabetes mellitus with hyperglycemia: Secondary | ICD-10-CM | POA: Diagnosis not present

## 2022-11-03 DIAGNOSIS — G9341 Metabolic encephalopathy: Secondary | ICD-10-CM | POA: Diagnosis not present

## 2022-11-03 DIAGNOSIS — F17200 Nicotine dependence, unspecified, uncomplicated: Secondary | ICD-10-CM | POA: Diagnosis not present

## 2022-11-03 DIAGNOSIS — I739 Peripheral vascular disease, unspecified: Secondary | ICD-10-CM | POA: Diagnosis not present

## 2022-11-03 DIAGNOSIS — K648 Other hemorrhoids: Secondary | ICD-10-CM | POA: Diagnosis not present

## 2022-11-03 DIAGNOSIS — I252 Old myocardial infarction: Secondary | ICD-10-CM | POA: Diagnosis not present

## 2022-11-03 DIAGNOSIS — D123 Benign neoplasm of transverse colon: Secondary | ICD-10-CM | POA: Diagnosis not present

## 2022-11-03 DIAGNOSIS — I5082 Biventricular heart failure: Secondary | ICD-10-CM | POA: Diagnosis present

## 2022-11-03 DIAGNOSIS — F05 Delirium due to known physiological condition: Secondary | ICD-10-CM | POA: Diagnosis present

## 2022-11-03 DIAGNOSIS — Z794 Long term (current) use of insulin: Secondary | ICD-10-CM | POA: Diagnosis not present

## 2022-11-03 DIAGNOSIS — E119 Type 2 diabetes mellitus without complications: Secondary | ICD-10-CM | POA: Diagnosis not present

## 2022-11-03 DIAGNOSIS — K3189 Other diseases of stomach and duodenum: Secondary | ICD-10-CM | POA: Diagnosis not present

## 2022-11-03 DIAGNOSIS — K635 Polyp of colon: Secondary | ICD-10-CM | POA: Diagnosis not present

## 2022-11-03 DIAGNOSIS — I255 Ischemic cardiomyopathy: Secondary | ICD-10-CM | POA: Diagnosis not present

## 2022-11-03 DIAGNOSIS — K644 Residual hemorrhoidal skin tags: Secondary | ICD-10-CM | POA: Diagnosis not present

## 2022-11-03 DIAGNOSIS — N1831 Chronic kidney disease, stage 3a: Secondary | ICD-10-CM | POA: Diagnosis not present

## 2022-11-03 DIAGNOSIS — I5023 Acute on chronic systolic (congestive) heart failure: Secondary | ICD-10-CM | POA: Diagnosis not present

## 2022-11-03 DIAGNOSIS — Z1211 Encounter for screening for malignant neoplasm of colon: Secondary | ICD-10-CM | POA: Diagnosis not present

## 2022-11-03 DIAGNOSIS — N1832 Chronic kidney disease, stage 3b: Secondary | ICD-10-CM | POA: Diagnosis present

## 2022-11-03 DIAGNOSIS — E669 Obesity, unspecified: Secondary | ICD-10-CM | POA: Diagnosis present

## 2022-11-03 DIAGNOSIS — E1151 Type 2 diabetes mellitus with diabetic peripheral angiopathy without gangrene: Secondary | ICD-10-CM | POA: Diagnosis present

## 2022-11-03 DIAGNOSIS — D320 Benign neoplasm of cerebral meninges: Secondary | ICD-10-CM | POA: Diagnosis present

## 2022-11-03 DIAGNOSIS — K5900 Constipation, unspecified: Secondary | ICD-10-CM | POA: Diagnosis not present

## 2022-11-03 DIAGNOSIS — Z91148 Patient's other noncompliance with medication regimen for other reason: Secondary | ICD-10-CM | POA: Diagnosis not present

## 2022-11-03 DIAGNOSIS — E1142 Type 2 diabetes mellitus with diabetic polyneuropathy: Secondary | ICD-10-CM | POA: Diagnosis present

## 2022-11-03 DIAGNOSIS — J9601 Acute respiratory failure with hypoxia: Secondary | ICD-10-CM | POA: Diagnosis not present

## 2022-11-03 DIAGNOSIS — Z7984 Long term (current) use of oral hypoglycemic drugs: Secondary | ICD-10-CM | POA: Diagnosis not present

## 2022-11-03 DIAGNOSIS — N183 Chronic kidney disease, stage 3 unspecified: Secondary | ICD-10-CM | POA: Diagnosis not present

## 2022-11-03 DIAGNOSIS — J9811 Atelectasis: Secondary | ICD-10-CM | POA: Diagnosis not present

## 2022-11-03 DIAGNOSIS — K625 Hemorrhage of anus and rectum: Secondary | ICD-10-CM | POA: Diagnosis not present

## 2022-11-03 DIAGNOSIS — I130 Hypertensive heart and chronic kidney disease with heart failure and stage 1 through stage 4 chronic kidney disease, or unspecified chronic kidney disease: Secondary | ICD-10-CM | POA: Diagnosis present

## 2022-11-03 DIAGNOSIS — F1026 Alcohol dependence with alcohol-induced persisting amnestic disorder: Secondary | ICD-10-CM | POA: Diagnosis present

## 2022-11-03 DIAGNOSIS — K2970 Gastritis, unspecified, without bleeding: Secondary | ICD-10-CM | POA: Diagnosis not present

## 2022-11-03 DIAGNOSIS — D329 Benign neoplasm of meninges, unspecified: Secondary | ICD-10-CM | POA: Diagnosis not present

## 2022-11-03 DIAGNOSIS — R569 Unspecified convulsions: Secondary | ICD-10-CM | POA: Diagnosis not present

## 2022-11-03 DIAGNOSIS — K2980 Duodenitis without bleeding: Secondary | ICD-10-CM | POA: Diagnosis not present

## 2022-11-03 LAB — BASIC METABOLIC PANEL
Anion gap: 7 (ref 5–15)
BUN: 19 mg/dL (ref 8–23)
CO2: 28 mmol/L (ref 22–32)
Calcium: 7.8 mg/dL — ABNORMAL LOW (ref 8.9–10.3)
Chloride: 103 mmol/L (ref 98–111)
Creatinine, Ser: 1.6 mg/dL — ABNORMAL HIGH (ref 0.61–1.24)
GFR, Estimated: 47 mL/min — ABNORMAL LOW (ref >60)
Glucose, Bld: 135 mg/dL — ABNORMAL HIGH (ref 70–99)
Potassium: 3.6 mmol/L (ref 3.5–5.1)
Sodium: 138 mmol/L (ref 135–145)

## 2022-11-03 LAB — GLUCOSE, CAPILLARY
Glucose-Capillary: 150 mg/dL — ABNORMAL HIGH (ref 70–99)
Glucose-Capillary: 134 mg/dL — ABNORMAL HIGH (ref 70–99)
Glucose-Capillary: 110 mg/dL — ABNORMAL HIGH (ref 70–99)
Glucose-Capillary: 140 mg/dL — ABNORMAL HIGH (ref 70–99)

## 2022-11-03 LAB — BRAIN NATRIURETIC PEPTIDE: B Natriuretic Peptide: 1220 pg/mL — ABNORMAL HIGH (ref 0.0–100.0)

## 2022-11-03 MED ORDER — FUROSEMIDE 10 MG/ML IJ SOLN
40.0000 mg | Freq: Once | INTRAMUSCULAR | Status: AC
  Administered 2022-11-03: 40 mg via INTRAVENOUS
  Filled 2022-11-03: qty 4

## 2022-11-03 MED ORDER — CEPHALEXIN 500 MG PO CAPS
500.0000 mg | ORAL_CAPSULE | Freq: Three times a day (TID) | ORAL | Status: DC
  Administered 2022-11-03 – 2022-11-13 (×30): 500 mg via ORAL
  Filled 2022-11-03 (×30): qty 1

## 2022-11-03 NOTE — TOC CM/SW Note (Signed)
Transition of Care [facility]) - Inpatient Brief Assessment   Patient Details  Name: CHESKY RESPRESS MRN: [id number] Date of Birth: 02/20/1955  Transition of [facility]) CM/SW Contact:    Villa Herb, LCSWA Phone Number: 11/03/2022, 10:34 AM   Clinical Narrative: [facility] notified [facility]. [facility] notification ID is [phone].   Transition of Care Asessment: Insurance and Status: Insurance coverage has been reviewed Patient has primary care physician: Yes Home environment has been reviewed: from home Prior level of function:: some assistance Prior/Current Home Services: No current home services Social Determinants of Health Reivew: SDOH reviewed no interventions necessary Readmission risk has been reviewed: Yes Transition of care needs: no transition of care needs at this time

## 2022-11-03 NOTE — Progress Notes (Signed)
Patient called to the nurse's station complaining of shortness of breath. Upon entering the room myself assessed the patient with Jiles Crocker, RN. Patient O2 saturation 100% on room air, respirations 20, and pulse 75. Lung sounds clear in all fields. Patient repositioned and stated he felt better at this time.

## 2022-11-03 NOTE — Progress Notes (Signed)
[facility] PROGRESS NOTE  [name] (DOB: 1955-09-06) ZOX:[id number] PCP: Benetta Spar, MD  Brief Narrative: [name] is a 67 y.o. male with a history of CAD, tobacco use, HTN, HLD, T2DM, GERD, history of cocaine use, OSA, and combined CHF (LVEF 20-25%, G2DD) who presented to the ED 10/23 with migratory abdominal/back/pelvic pain and dyspnea. He was admitted for recurrent acute on chronic combined CHF, [place] +cocaine. IV diuresis is underway with cardiology consultation.   Subjective: Reports a couple days-weeks of boils on the right jaw and left buttock. Short of breath at rest still. Also itchy all over. He's been sleeping most of the day.   Objective: BP 116/79 (BP Location: Left Arm)   Pulse 67   Temp 97.9 F (36.6 C)   Resp 19   Wt 105.3 kg   SpO2 100%   BMI 34.28 kg/m   Gen: No distress, chronically ill-appearing Pulm: Crackles at bases, diminished without wheezes  CV: RRR, 2+ pitting edema, +JVD GI: Soft, NT, ND, +BS Neuro: Rousable, cooperative, oriented. No new focal deficits. Ext: Warm, no deformities Skin: Mild subcutaneous induration without fluctuance localized to ~2cm annular region on left buttock and ~1cm annular area on right jaw. Diffuse ichthyosis. No other new rashes, lesions or ulcers on visualized skin   Assessment & Plan: Acute hypoxic respiratory failure due to acute on chronic HFrEF, pleural effusions:  - Diurese as below, wean oxygen as tolerated. Still with crackles.    Acute on chronic combined HFrEF and RV failure:  - Unclear whether he was taking cardiac medications PTA, though he reports he was. With bump in Creatinine, consider gentle diuresis. Given severity of biventricular failure, cardiology consulted for advice.  - Monitor I/O (may not be completely charted), weights, BMP.  - Continued jardiance - Given cocaine positivity, will stop coreg.   Cocaine abuse: +UDS - Cessation counseling  OSA:  - CPAP qHS    Scrotal pain: U/S reassuring, ?if hydrocele from overload contributing. Also note Dx from PCP "scrotal pain, chronic, bilateral" a year ago.    Peripheral neuropathy:  - Continue gabapentin   CAD, HTN: No chest pain. Later did have atypical chest pain likely musculoskeletal with persistently negative troponin and no ischemic ECG changes.  - Continue ASA, statin, BB  Cellulitis: Localized induration in areas where he may have injured the skin. No fluctuance/indication for I&D at this time.  - Treat with keflex.   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    T2DM:  - SSI   Tobacco use:  - Nicotine patch - Cessation counseling   Obesity: BMI 36.    Tyrone Nine, MD [facility] [email or web address] 11/03/2022, 11:37 AM

## 2022-11-03 NOTE — Consult Note (Addendum)
Cardiology Consultation   Patient ID: [name] MRN: [id number]; DOB: 11-26-1955  Admit date: 11/02/2022 Date of Consult: 11/03/2022  PCP:  Benetta Spar, MD   [facility] Providers Cardiologist:  Reatha Harps, MD        Patient Profile:   [name] is a 67 y.o. male with a hx of CAD (s/p prior LAD stenting, DES to RCA in 08/2015, patent stent by repeat cath in 04/2016 with diffuse nonobstructive CAD along distal-LAD, OM2 and PDA, cath in 01/2018 showing multivessel CAD and not felt to be candidate for CABG and PCI was not pursued, cath in 03/2019 with DES to PDA and distal RCA with medical management recommended of residual disease), PAD (s/p PTA and covered stenting of left proximal left external iliac artery in 11/2017), Chronic HFrEF/ischemic cardiomyopathy (EF 45-50% by echo in 08/2015, EF 30-35% in 03/2019, 20-25% in 08/2020 and 25-30% in 10/2021), apical thrombus (documented at [place] in early 2020 and started on Xarelto), history of cocaine use, HTN, HLD, IDDM, Stage 3 CKD, Fournier's gangrene tobacco use and cocaine use who is being seen 11/03/2022 for the evaluation of acute HFrEF at the request of Dr. Jarvis Newcomer.  History of Present Illness:   [name] was last examined by the Cardiology service in 08/2021 and at that time he was only taking Tramadol and Gabapentin and was not interested in being on cardiac medications. The risk of this was reviewed and he still wished to remain off all cardiac medications. Was encouraged to follow-up in 1 year and reach out in the interim if he was in agreement with restarting medications.  He was admitted to [facility] from 10/11 - 10/25/2022 for an acute CHF exacerbation and repeat echocardiogram showed that his EF remained reduced at 20 to 25% with global hypokinesis, grade 2 diastolic dysfunction, mildly reduced RV function mild MR. He was started on Coreg 3.125 mg twice daily, Jardiance 10 mg daily and Lasix 40  mg daily. Was also continued on ASA 81 mg daily and Atorvastatin 40 mg daily.  Presented back to the ED on 11/01/2022 for evaluation of testicular pain and abdominal swelling. Imaging at that time showed a complex left epididymal head cyst and small left hydrocele. CT abdomen showed no nephrolithiasis or ureterolithiasis but was noted to have a small volume of free fluid along the left and right paracolic gutter. Was discharged home but presented back to the ED the following day for worsening abdominal pain and shortness of breath. Initial labs showed WBC 10.1, Hgb 9.3, platelets 259, Na+ 139, K+ 4.2 and creatinine 1.34 (close to baseline), lactic acid 2.3, lipase 32. BNP elevated to 1424. Initial and repeat hs Troponin values flat at 16 and 18.  UDS positive for cocaine. CXR showed mild bilateral interstitial opacities which may represent pulmonary edema.  CTA showed no evidence of aortic dissection or aneurysm but was noted to have bilateral effusions and mild pulmonary edema along with chronic occlusion of the left common iliac artery.  He was admitted for further management of acute hypoxic respiratory failure in the setting of a CHF exacerbation and received 40 mg IV Lasix while in the ED but with no additional diuresis thus far.  Creatinine elevated to 1.60 today. He was continued on ASA, Atorvastatin and Jardiance.  In talking with the patient today, he reports his main issue at the time of admission was abdominal pain. Reports this has been going on for several years since his prior  surgery. Reports shortness of breath with activity as well. No specific chest pain, orthopnea or PND. Does report scrotal edema and lower extremity edema  Says that he did not fill his medications following recent hospital discharge. Unsure what he takes and says that he does not take his medications routinely.   Past Medical History:  Diagnosis Date   Bulging lumbar disc    CAD (coronary artery disease) [phone]    a. prior LAD stenting. b. s/p DES to [facility] 08/2015. c. 04/2016 Cardiac cath at [facility]. Patent stent in the PLAD and RCA. Diffuse dLAD, OM2, and  RPDA disease. d. DES to PDA and distal RCA 03/2019    Chronic lower back pain    CKD (chronic kidney disease), stage II    Cocaine abuse (HCC)    Cyst of epididymis    DM2 (diabetes mellitus, type 2) (HCC)    Essential hypertension    GERD (gastroesophageal reflux disease)    Headache    History of pneumonia    Hyperlipidemia    Ischemic cardiomyopathy    LV (left ventricular) mural thrombus    Sleep apnea     Past Surgical History:  Procedure Laterality Date   APPENDECTOMY     BRONCHIAL NEEDLE ASPIRATION BIOPSY  11/12/2021   Procedure: BRONCHIAL NEEDLE ASPIRATION BIOPSIES;  Surgeon: Omar Person, MD;  Location: [facility] ENDOSCOPY;  Service: Pulmonary;;   CARDIAC CATHETERIZATION N/A 09/07/2015   Procedure: Left Heart Cath and Coronary Angiography;  Surgeon: Marykay Lex, MD;  Location: [facility] INVASIVE CV LAB;  Service: Cardiovascular;  Laterality: N/A;   CARDIAC CATHETERIZATION N/A 09/07/2015   Procedure: Coronary Stent Intervention;  Surgeon: Marykay Lex, MD;  Location: [facility] INVASIVE CV LAB;  Service: Cardiovascular;  Laterality: N/A;   CORONARY ANGIOGRAM  09/07/13   residual RCA and OM disease   CORONARY ANGIOPLASTY WITH STENT PLACEMENT     CORONARY STENT INTERVENTION N/A 10/16/2018   Procedure: CORONARY STENT INTERVENTION;  Surgeon: Kathleene Hazel, MD;  Location: [facility] INVASIVE CV LAB;  Service: Cardiovascular;  Laterality: N/A;   CORONARY STENT INTERVENTION N/A 03/11/2019   Procedure: CORONARY STENT INTERVENTION;  Surgeon: Corky Crafts, MD;  Location: [facility] INVASIVE CV LAB;  Service: Cardiovascular;  Laterality: N/A;   FRACTURE SURGERY     INCISION AND DRAINAGE OF WOUND Left 05/19/2019   Procedure: DEBRIDEMENT LEFT GROIN;  Surgeon: Violeta Gelinas, MD;  Location: [facility] OR;  Service: General;  Laterality: Left;   INSERTION OF  ILIAC STENT Left 11/30/2017   Left external illiac stent   INSERTION OF ILIAC STENT  11/30/2017   Procedure: Insertion Of Iliac Stent;  Surgeon: Runell Gess, MD;  Location: [facility] INVASIVE CV LAB;  Service: Cardiovascular;;  Left external illiac stent   KNEE ARTHROSCOPY Left    KNEE SURGERY     "ligaments, cartilage; tendon, put a pin in" (11/30/2017)   LEFT HEART CATH Bilateral 07/08/2012   Procedure: LEFT HEART CATH;  Surgeon: Corky Crafts, MD;  Location: [facility] CATH LAB;  Service: Cardiovascular;  Laterality: Bilateral;   LEFT HEART CATH AND CORONARY ANGIOGRAPHY N/A 10/16/2018   Procedure: LEFT HEART CATH AND CORONARY ANGIOGRAPHY;  Surgeon: Kathleene Hazel, MD;  Location: [facility] INVASIVE CV LAB;  Service: Cardiovascular;  Laterality: N/A;   LEFT HEART CATH AND CORONARY ANGIOGRAPHY N/A 03/11/2019   Procedure: LEFT HEART CATH AND CORONARY ANGIOGRAPHY;  Surgeon: Corky Crafts, MD;  Location: [facility] INVASIVE CV LAB;  Service: Cardiovascular;  Laterality: N/A;  LEFT HEART CATHETERIZATION WITH CORONARY ANGIOGRAM N/A 09/06/2013   STEMI, 2nd ISR LAD. Procedure: LEFT HEART CATHETERIZATION WITH CORONARY ANGIOGRAM;  Surgeon: Corky Crafts, MD;  Location: [facility] CATH LAB;  Service: Cardiovascular;  Laterality: N/A;   LOWER EXTREMITY ANGIOGRAPHY N/A 11/30/2017   Procedure: LOWER EXTREMITY ANGIOGRAPHY;  Surgeon: Runell Gess, MD;  Location: [facility] INVASIVE CV LAB;  Service: Cardiovascular;  Laterality: N/A;   PERCUTANEOUS CORONARY STENT INTERVENTION (PCI-S)  07/08/2012   Procedure: PERCUTANEOUS CORONARY STENT INTERVENTION (PCI-S);  Surgeon: Corky Crafts, MD;  Location: [facility] CATH LAB;  Service: Cardiovascular;;  DES LAD   PERCUTANEOUS CORONARY STENT INTERVENTION (PCI-S) N/A 09/06/2013   Procedure: PERCUTANEOUS CORONARY STENT INTERVENTION (PCI-S);  Surgeon: Corky Crafts, MD;  Location: [facility] CATH LAB;  Service: Cardiovascular;  Laterality: N/A;  Mid LAD 3.0/24mm Promus   THORACENTESIS Right  11/10/2021   Procedure: THORACENTESIS;  Surgeon: Leslye Peer, MD;  Location: [facility] ENDOSCOPY;  Service: Cardiopulmonary;  Laterality: Right;   VIDEO BRONCHOSCOPY WITH ENDOBRONCHIAL ULTRASOUND Right 11/12/2021   Procedure: VIDEO BRONCHOSCOPY WITH ENDOBRONCHIAL ULTRASOUND;  Surgeon: Omar Person, MD;  Location: [facility] ENDOSCOPY;  Service: Pulmonary;  Laterality: Right;   WOUND DEBRIDEMENT Left 05/20/2019   Procedure: DEBRIDEMENT GROIN;  Surgeon: Kinsinger, De Blanch, MD;  Location: [facility] OR;  Service: General;  Laterality: Left;   WRIST FRACTURE SURGERY Bilateral      Home Medications:  Prior to Admission medications   Medication Sig Start Date End Date Taking? Authorizing Provider  furosemide (LASIX) 40 MG tablet Take 1 tablet (40 mg total) by mouth daily. 10/25/22 11/24/22 Yes Vassie Loll, MD  gabapentin (NEURONTIN) 300 MG capsule Take 1 capsule (300 mg total) by mouth 3 (three) times daily. 10/25/22  Yes Vassie Loll, MD  nicotine (NICODERM CQ - DOSED IN MG/24 HOURS) 21 mg/24hr patch Place 1 patch (21 mg total) onto the skin daily. 10/26/22  Yes Vassie Loll, MD  atorvastatin (LIPITOR) 40 MG tablet Take 1 tablet (40 mg total) by mouth at bedtime. Patient not taking: Reported on 11/02/2022 10/25/22 11/24/22  Vassie Loll, MD  carvedilol (COREG) 3.125 MG tablet Take 1 tablet (3.125 mg total) by mouth 2 (two) times daily with a meal. Patient not taking: Reported on 11/02/2022 10/25/22 11/24/22  Vassie Loll, MD  empagliflozin (JARDIANCE) 10 MG TABS tablet Take 1 tablet (10 mg total) by mouth daily. Patient not taking: Reported on 11/02/2022 10/26/22   Vassie Loll, MD  pantoprazole (PROTONIX) 40 MG tablet Take 1 tablet (40 mg total) by mouth daily. Patient not taking: Reported on 11/02/2022 10/26/22   Vassie Loll, MD  sildenafil (VIAGRA) 50 MG tablet Take 50 mg by mouth daily as needed for erectile dysfunction. Patient not taking: Reported on 11/02/2022 08/26/22   [provider]    Inpatient Medications: Scheduled Meds:  aspirin EC  81 mg Oral Daily   atorvastatin  40 mg Oral QHS   empagliflozin  10 mg Oral Daily   enoxaparin (LOVENOX) injection  40 mg Subcutaneous Q24H   gabapentin  300 mg Oral TID   insulin aspart  0-15 Units Subcutaneous TID WC   insulin aspart  0-5 Units Subcutaneous QHS   nicotine  21 mg Transdermal Daily   pantoprazole  40 mg Oral Daily   sodium chloride flush  3 mL Intravenous Q12H    PRN Meds: acetaminophen **OR** acetaminophen, methocarbamol, nitroGLYCERIN  Allergies:    Allergies  Allergen Reactions   Clopidogrel Other (See Comments)    Drowsy, Skin  irritation    Social History:   Social History   Tobacco Use   Smoking status: Every Day    Current packs/day: 0.50    Average packs/day: 0.5 packs/day for 48.0 years (24.0 ttl pk-yrs)    Types: Cigarettes   Smokeless tobacco: Never  Substance Use Topics   Alcohol use: Not Currently    Comment: 11/30/2017 "might drink a beer q 6 months"     Family History:    Family History  Problem Relation Age of Onset   Hypertension Mother    Diabetes Mother      ROS:  Please see the history of present illness. All other ROS reviewed and negative.     Physical Exam/Data:   Vitals:   11/03/22 0500 11/03/22 0605 11/03/22 0755 11/03/22 0958  BP:  106/81 99/72 116/79  Pulse:  65 69 67  Resp:  (!) 23  19  Temp:  98.2 F (36.8 C)  97.9 F (36.6 C)  TempSrc:  Axillary    SpO2:  100%  100%  Weight: 105.3 kg       Intake/Output Summary (Last 24 hours) at 11/03/2022 1130 Last data filed at 11/03/2022 0850 Gross per 24 hour  Intake 243 ml  Output 1025 ml  Net -782 ml      11/03/2022    5:00 AM 11/01/2022   11:28 AM 10/25/2022    4:31 AM  Last 3 Weights  Weight (lbs) 232 lb 2.3 oz 246 lb 0.5 oz 246 lb 0.5 oz  Weight (kg) 105.3 kg 111.6 kg 111.6 kg     Body mass index is 34.28 kg/m.  General:  Well nourished, well developed male appearing in no  acute distress HEENT: normal Neck: no JVD Vascular: No carotid bruits; Distal pulses 2+ bilaterally Cardiac:  normal S1, S2; RRR; no murmur Lungs: mild rales along bases bilaterally.   Abd: soft, nontender, no hepatomegaly  Ext: 1+ pitting edema bilaterally.  Musculoskeletal:  No deformities, BUE and BLE strength normal and equal Skin: warm and dry  Neuro:  CNs 2-12 intact, no focal abnormalities noted Psych:  Normal affect   EKG:  The EKG was personally reviewed and demonstrates: NSR, HR in 80's with 1st degree AV block and  PAC's. No acute ST changes.   Telemetry:  Telemetry was personally reviewed and demonstrates: NSR, HR in 70's to 80's with occasional PVC's.   Relevant CV Studies:  Echocardiogram: 10/2022 IMPRESSIONS    1. Left ventricular ejection fraction, by estimation, is 20 to 25%. The  left ventricle has severely decreased function. The left ventricle  demonstrates global hypokinesis. Left ventricular diastolic parameters are  consistent with Grade II diastolic  dysfunction (pseudonormalization). Elevated left atrial pressure. There is  the interventricular septum is flattened in systole and diastole,  consistent with right ventricular pressure and volume overload.   2. RV not well viusalized. Grossly appears mildly enlarged with mildly  decreased function. Right ventricular systolic function was not well  visualized. The right ventricular size is not well visualized. Tricuspid  regurgitation signal is inadequate for  assessing PA pressure.   3. Left atrial size was mildly dilated.   4. The mitral valve is abnormal. Mild mitral valve regurgitation. No  evidence of mitral stenosis.   5. The tricuspid valve is abnormal.   6. The aortic valve is tricuspid. There is mild calcification of the  aortic valve. There is mild thickening of the aortic valve. Aortic valve  regurgitation is not visualized. No aortic stenosis  is present.   7. The pulmonic valve was abnormal.    Laboratory Data:  High Sensitivity Troponin:   Recent Labs  Lab 10/21/22 0908 10/21/22 1155 11/02/22 0822 11/02/22 1019 11/02/22 1757  TROPONINIHS 10 10 16  18* 16     Chemistry Recent Labs  Lab 11/01/22 1217 11/02/22 0822 11/03/22 0428  NA 137 139 138  K 3.7 4.2 3.6  CL 104 105 103  CO2 21* 26 28  GLUCOSE 142* 119* 135*  BUN 24* 19 19  CREATININE 1.42* 1.34* 1.60*  CALCIUM 8.7* 8.4* 7.8*  MG  --  2.2  --   GFRNONAA 54* 58* 47*  ANIONGAP 12 8 7     Recent Labs  Lab 11/01/22 1217 11/02/22 0822  PROT 9.2* 8.0  ALBUMIN 3.2* 2.8*  AST 24 25  ALT 20 16  ALKPHOS 118 95  BILITOT 2.3* 1.6*   Hematology Recent Labs  Lab 11/01/22 1217 11/02/22 0822  WBC 11.9* 10.1  RBC 4.54 4.15*  HGB 10.0* 9.3*  HCT 35.5* 32.6*  MCV 78.2* 78.6*  MCH 22.0* 22.4*  MCHC 28.2* 28.5*  RDW 22.1* 22.0*  PLT 325 259    BNP Recent Labs  Lab 11/02/22 0822 11/03/22 0428  BNP 1,424.0* 1,220.0*     Radiology/Studies:  CT Angio Chest/Abd/Pel for Dissection W and/or Wo Contrast  Result Date: 11/02/2022 CLINICAL DATA:  Chest pain.  Acute aortic syndrome EXAM: CT ANGIOGRAPHY CHEST, ABDOMEN AND PELVIS TECHNIQUE: Non-contrast CT of the chest was initially obtained. Multidetector CT imaging through the chest, abdomen and pelvis was performed using the standard protocol during bolus administration of intravenous contrast. Multiplanar reconstructed images and MIPs were obtained and reviewed to evaluate the vascular anatomy. RADIATION DOSE REDUCTION: This exam was performed according to the departmental dose-optimization program which includes automated exposure control, adjustment of the mA and/or kV according to patient size and/or use of iterative reconstruction technique. CONTRAST:  OMNIPAQUE IOHEXOL 350 MG/ML SOLN COMPARISON:  CT chest abdomen pelvis 11/07/2021 CT abdomen 11/01/2022 FINDINGS: CTA CHEST FINDINGS Cardiovascular: Noncontrast series demonstrates no intramural hematoma  within the thoracic aorta. Contrast series demonstrates no aortic dissection or aneurysm. Great vessels normal. No pericardial fluid. Mediastinum/Nodes: Enlarged mediastinal lymph nodes are not changed from CT 1 year prior. For example LEFT lower paratracheal lymph node measuring 17 mm compared to 16 mm. RIGHT lower paratracheal node measuring 16 mm compares to 20 mm. Lungs/Pleura: Bilateral pleural effusions. Moderate effusion on the RIGHT and small effusion on the LEFT. Mild diffuse ground-glass density in the lungs suggest mild pulmonary edema. Mild interstitial thickening chest mild interstitial edema. No pneumothorax. No pneumonia. Musculoskeletal: No aggressive osseous lesion. Review of the MIP images confirms the above findings. CTA ABDOMEN AND PELVIS FINDINGS VASCULAR Aorta: Normal caliber aorta without aneurysm, dissection, vasculitis or significant stenosis. Celiac: Patent without evidence of aneurysm, dissection, vasculitis or significant stenosis. SMA: Patent without evidence of aneurysm, dissection, vasculitis or significant stenosis. Renals: Both renal arteries are patent without evidence of aneurysm, dissection, vasculitis, fibromuscular dysplasia or significant stenosis. IMA: Patent without evidence of aneurysm, dissection, vasculitis or significant stenosis. 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 Reconstitution of the LEFT femoral artery. No interval change. Veins: No obvious venous abnormality within the limitations of this arterial phase study. Review of the MIP images confirms the above findings. NON-VASCULAR Lower chest: Lung bases are clear. Hepatobiliary: No focal hepatic lesion. Normal gallbladder. No biliary duct dilatation. Common bile duct is normal. Pancreas: Pancreas is normal. No ductal  dilatation. No pancreatic inflammation. Spleen: Normal spleen Adrenals/urinary tract: Adrenal glands and kidneys are normal. The ureters and bladder normal. Stomach/Bowel: Stomach,  small bowel, appendix, and cecum are normal. The colon and rectosigmoid colon are normal. Vascular/Lymphatic: Abdominal aorta is normal caliber. No periportal or retroperitoneal adenopathy. No pelvic adenopathy. Reproductive: Prostate unremarkable Other: No free fluid. Musculoskeletal: No aggressive osseous lesion. Review of the MIP images confirms the above findings. IMPRESSION: CHEST: 1. No evidence of aortic dissection or aneurysm. 2. Bilateral pleural effusions and mild pulmonary edema. 3. Stable mediastinal adenopathy. PELVIS: 1. No evidence of aortic dissection or aneurysm. 2. Chronic occlusion of the LEFT common iliac artery with reconstitution of the LEFT femoral artery. 3. No acute findings in the abdomen pelvis. Electronically Signed   By: Genevive Bi M.D.   On: 11/02/2022 11:25   DG Chest Portable 1 View  Result Date: 11/02/2022 CLINICAL DATA:  Shortness of breath EXAM: PORTABLE CHEST 1 VIEW COMPARISON:  Chest radiograph dated 10/21/2022 FINDINGS: Normal lung volumes. Mild bilateral interstitial opacities. No pleural effusion or pneumothorax. Similar enlarged cardiomediastinal silhouette. No acute osseous abnormality. IMPRESSION: 1. Mild bilateral interstitial opacities, which may represent pulmonary edema. 2. Similar cardiomegaly. Electronically Signed   By: Agustin Cree M.D.   On: 11/02/2022 09:43   CT ABDOMEN PELVIS W CONTRAST  Result Date: 11/01/2022 CLINICAL DATA:  Abdominal pain and flank pain.  Stone suspected EXAM: CT ABDOMEN AND PELVIS WITH CONTRAST TECHNIQUE: Multidetector CT imaging of the abdomen and pelvis was performed using the standard protocol following bolus administration of intravenous contrast. RADIATION DOSE REDUCTION: This exam was performed according to the departmental dose-optimization program which includes automated exposure control, adjustment of the mA and/or kV according to patient size and/or use of iterative reconstruction technique. CONTRAST:  OMNIPAQUE  IOHEXOL 300 MG/ML  SOLN COMPARISON:  None Available. FINDINGS: Lower chest: Moderate pleural effusion Hepatobiliary: No focal hepatic lesion. Two tiny gallstones measuring less than 5 mm each. No gallbladder distension. No biliary duct dilatation. Common bile duct is normal. Pancreas: Pancreas is normal. No ductal dilatation. No pancreatic inflammation. Spleen: Normal spleen Adrenals/urinary tract: Adrenal glands normal. No nephrolithiasis or ureterolithiasis. Perinephric stranding similar comparison CT. No renal obstruction on delayed imaging. Bladder normal. Stomach/Bowel: Stomach, small-bowel and cecum are normal. The appendix is not identified but there is no pericecal inflammation to suggest appendicitis. Insert Vascular/Lymphatic: Abdominal aorta is normal caliber with atherosclerotic calcification. There is no retroperitoneal or periportal lymphadenopathy. No pelvic lymphadenopathy. Reproductive: Prostate normal Other: small volume of free fluid along the LEFT and RIGHT pericolic gutter. Musculoskeletal: No aggressive osseous lesion. IMPRESSION: 1. No nephrolithiasis or ureterolithiasis. 2. Perinephric stranding similar to comparison CT. No renal obstruction on delayed imaging. 3. Moderate pleural effusion. 4. Small volume of free fluid along the LEFT and RIGHT pericolic gutter. 5. Cholelithiasis without evidence of cholecystitis. 6.  Aortic Atherosclerosis (ICD10-I70.0). Electronically Signed   By: Genevive Bi M.D.   On: 11/01/2022 16:45   US SCROTUM W/DOPPLER  Result Date: 11/01/2022 CLINICAL DATA:  Testicular pain EXAM: SCROTAL ULTRASOUND DOPPLER ULTRASOUND OF THE TESTICLES TECHNIQUE: Complete ultrasound examination of the testicles, epididymis, and other scrotal structures was performed. Color and spectral Doppler ultrasound were also utilized to evaluate blood flow to the testicles. COMPARISON:  None Available. FINDINGS: Right testicle Measurements: 3.3 x 1.6 x 2.8 cm. No mass or microlithiasis  visualized. Left testicle Measurements: 3.4 x 2.2 x 2.2 cm. No mass or microlithiasis visualized. Right epididymis:  Normal in size and appearance. Left  epididymis: Epididymal head cyst with internal echogenic debris measuring 1.1 x 1.0 x 1.0 cm. Hydrocele:  Small left hydrocele. Varicocele:  None visualized. Pulsed Doppler interrogation of both testes demonstrates normal low resistance arterial and venous waveforms bilaterally. IMPRESSION: 1. Complex left epididymal head cyst measuring up to 1.1 cm. 2. Small left hydrocele. Electronically Signed   By: Allegra Lai M.D.   On: 11/01/2022 11:46     Assessment and Plan:   1. Acute HFrEF - He has a known cardiomyopathy dating back for several years but previously declined medical therapy as discussed in his office note from 2023. Currently admitted with an acute CHF exacerbation and BNP elevated 1424 on admission. He received IV Lasix 40 mg x 1 yesterday and would dose again today given volume overload by examination. Would reassess volume status and renal function tomorrow morning before administering additional doses. - Carvedilol is held until respiratory status improves.Would prefer this over Toprol-XL given his cocaine use. Based off renal function, could perhaps add a low-dose ARB or Spironolactone. Doubt BP would allow for Entresto. Reports previously stopping several medications in the past due to "feeling bad" on these, therefore would make gradual medication adjustments. Will stop Jardiance given prior Fournier's gangrene and would avoid SGLT2 inhibitors going forward. Also being treated for cellulitis this admission.  2. CAD - He has a history of complex CAD as outlined above and most recent cardiac catheterization in 03/2019 showed multivessel disease and he received DES to the PDA and distal RCA with medical management recommended of residual disease. Troponin values have been flat this admission, peaking at 18 and EKG is without acute ST  changes. He denies any recent chest pain. No plans for further ischemic testing at this time and would have a high threshold to pursue repeat cardiac catheterization given his medication noncompliance over the years. - Continue ASA 81 mg daily and Atorvastatin 40 mg daily.  3. History of Apical Thrombus - Prior echocardiogram imaging showed evidence of an apical thrombus but most recent imaging earlier this month did not mention this. He is no longer on anticoagulation.  4. HLD - Followed by PCP. Continue Atorvastatin 40 mg daily.    5. Stage 3 CKD - Baseline creatinine 1.4 - 1.5. At 1.34 on admission and trending up to 1.60 today. Continue to follow with diuresis.  6. Substance Use - UDS positive for cocaine this admission.  For questions or updates, please contact [facility] Please consult [email or web address] for contact info under    Signed, Ellsworth Lennox, PA-C  11/03/2022 11:30 AM   Attending note:  Patient seen and examined.  I reviewed his records and discussed the case with Ms. Patrick Jupiter, I agree with her above findings.  [name] presents with fairly extensive cardiac history as discussed above complicated by noncompliance with both follow-up and medical therapy.  He is currently admitted mainly complaining of scrotal and abdominal discomfort, has had some abdominal fullness and fluctuating leg edema.  He was recently hospitalized with acute on chronic HFrEF, echocardiogram earlier this month revealed LVEF 20 to 25% range, also mild RV dysfunction.  He improved with IV diuresis, did not pick up any prescribed medications following discharge however and in general he does not take a diuretic at home.  He states that when he has taken medications previously he felt worse.  On examination today he is lying supine, no shortness of breath at rest.  Afebrile, heart rate in the 70s in sinus rhythm by  telemetry, blood pressure 116/79.  Current weight 105 kg which is less  than his recent hospitalization weight.  Lungs exhibit decreased breath sounds consistent with pleural effusions, cardiac exam with indistinct PMI, RRR no gallop.  Abdominal fullness noted, he is scrotal edema and 2+ peripheral edema.  Pertinent lab work includes potassium 3.6, creatinine 1.6 up from 1.34, normal LFTs, BNP 1220, high-sensitivity troponin I of 16 and 18, lactate 2.3, hemoglobin 9.3, platelets 259, UDS positive for cocaine.  Chest/abdomen/pelvic CTA reports bilateral pleural effusions with mild pulmonary edema, no aortic dissection or aneurysm, stable mediastinal adenopathy.  No acute findings reported in the abdomen or pelvis.  Scrotal ultrasound reports a small left hydrocele and complex left epididymal head cyst.  ECG shows sinus rhythm with prolonged PR interval, poor R wave progression throughout the precordium rule out old anterior infarct pattern (old).  Acute on chronic HFrEF complicated by noncompliance with both follow-up and regular medical therapy in addition to recent cocaine use.  Main focus at this time will be diuresis as tolerated and optimization of fluid status.  Agree with discontinuing Coreg, we are also stopping Jardiance given patient's prior history of necrotizing fasciitis requiring groin debridement in 2021.  Would not use SGLT2 inhibitors in his case.  As per above discussion, he is very hesitant to take regular medications for his heart, can try and address at least basic GDMT as able.  Cessation of further cocaine use is also imperative.  He is not a candidate for advanced heart failure therapies or ICD.  Current cardiac regimen will for now will include aspirin, Lipitor, and IV Lasix.  Jonelle Sidle, M.D., F.A.C.C.

## 2022-11-04 DIAGNOSIS — I5023 Acute on chronic systolic (congestive) heart failure: Secondary | ICD-10-CM | POA: Diagnosis not present

## 2022-11-04 LAB — BASIC METABOLIC PANEL
Anion gap: 6 (ref 5–15)
BUN: 20 mg/dL (ref 8–23)
CO2: 29 mmol/L (ref 22–32)
Calcium: 7.9 mg/dL — ABNORMAL LOW (ref 8.9–10.3)
Chloride: 100 mmol/L (ref 98–111)
Creatinine, Ser: 1.63 mg/dL — ABNORMAL HIGH (ref 0.61–1.24)
GFR, Estimated: 46 mL/min — ABNORMAL LOW (ref >60)
Glucose, Bld: 117 mg/dL — ABNORMAL HIGH (ref 70–99)
Potassium: 4.2 mmol/L (ref 3.5–5.1)
Sodium: 135 mmol/L (ref 135–145)

## 2022-11-04 LAB — GLUCOSE, CAPILLARY
Glucose-Capillary: 106 mg/dL — ABNORMAL HIGH (ref 70–99)
Glucose-Capillary: 220 mg/dL — ABNORMAL HIGH (ref 70–99)
Glucose-Capillary: 122 mg/dL — ABNORMAL HIGH (ref 70–99)
Glucose-Capillary: 126 mg/dL — ABNORMAL HIGH (ref 70–99)

## 2022-11-04 MED ORDER — HYDROXYZINE HCL 25 MG PO TABS
25.0000 mg | ORAL_TABLET | Freq: Three times a day (TID) | ORAL | Status: DC | PRN
  Administered 2022-11-04 – 2022-11-08 (×4): 25 mg via ORAL
  Filled 2022-11-04 (×4): qty 1

## 2022-11-04 MED ORDER — FUROSEMIDE 10 MG/ML IJ SOLN
40.0000 mg | Freq: Two times a day (BID) | INTRAMUSCULAR | Status: DC
Start: 2022-11-04 — End: 2022-11-05
  Administered 2022-11-04 – 2022-11-05 (×2): 40 mg via INTRAVENOUS
  Filled 2022-11-04 (×3): qty 4

## 2022-11-04 MED ORDER — POLYETHYLENE GLYCOL 3350 17 G PO PACK
17.0000 g | PACK | Freq: Two times a day (BID) | ORAL | Status: DC
  Administered 2022-11-04 – 2022-11-10 (×10): 17 g via ORAL
  Filled 2022-11-04 (×11): qty 1

## 2022-11-04 MED ORDER — SENNA 8.6 MG PO TABS
1.0000 | ORAL_TABLET | Freq: Every evening | ORAL | Status: DC | PRN
  Administered 2022-11-04: 8.6 mg via ORAL
  Filled 2022-11-04: qty 1

## 2022-11-04 MED ORDER — BISACODYL 10 MG RE SUPP
10.0000 mg | Freq: Once | RECTAL | Status: AC
  Administered 2022-11-04: 10 mg via RECTAL
  Filled 2022-11-04: qty 1

## 2022-11-04 NOTE — TOC Initial Note (Signed)
Transition of Care [facility]) - Initial/Assessment Note    Patient Details  Name: [name] MRN: [id number] Date of Birth: 1955-11-21  Transition of Care [facility]) CM/SW Contact:    Villa Herb, LCSWA Phone Number: 11/04/2022, 10:23 AM  Clinical Narrative:                 Pt high risk for readmission. Pt was recently assessed at last hospital admission two weeks ago. Pt lives alone and completed his ADLs independently. Pt rides the bus when needed. Pt has a rollator to use in the home. Pt is active with [facility] services, will need MD to place new orders prior to D/C home. TOC to follow.   Expected Discharge Plan: Home w Home Health Services Barriers to Discharge: Continued Medical Work up   Patient Goals and CMS Choice Patient states their goals for this hospitalization and ongoing recovery are:: return home CMS Medicare.gov Compare Post Acute Care list provided to:: Patient Choice offered to / list presented to : Patient      Expected Discharge Plan and Services In-house Referral: Clinical Social Work Discharge Planning Services: CM Consult Post Acute Care Choice: Home Health Living arrangements for the past 2 months: Single Family Home                                      Prior Living Arrangements/Services Living arrangements for the past 2 months: Single Family Home Lives with:: Self Patient language and need for interpreter reviewed:: Yes Do you feel safe going back to the place where you live?: Yes      Need for Family Participation in Patient Care: Yes (Comment) Care giver support system in place?: Yes (comment) Current home services: DME (rollator) Criminal Activity/Legal Involvement Pertinent to Current Situation/Hospitalization: No - Comment as needed  Activities of Daily Living   ADL Screening (condition at time of admission) Independently performs ADLs?: Yes (appropriate for developmental age) Does the patient have a NEW difficulty with  bathing/dressing/toileting/self-feeding that is expected to last >3 days?: No Does the patient have a NEW difficulty with getting in/out of bed, walking, or climbing stairs that is expected to last >3 days?: No Does the patient have a NEW difficulty with communication that is expected to last >3 days?: No Is the patient deaf or have difficulty hearing?: No Does the patient have difficulty seeing, even when wearing glasses/contacts?: No Does the patient have difficulty concentrating, remembering, or making decisions?: No  Permission Sought/Granted                  Emotional Assessment Appearance:: Appears stated age Attitude/Demeanor/Rapport: Engaged Affect (typically observed): Accepting Orientation: : Oriented to Self, Oriented to Place, Oriented to  Time, Oriented to Situation Alcohol / Substance Use: Not Applicable Psych Involvement: No (comment)  Admission diagnosis:  Acute respiratory failure with hypoxia (HCC) [J96.01] Acute on chronic HFrEF (heart failure with reduced ejection fraction) (HCC) [I50.23] Acute on chronic congestive heart failure, unspecified heart failure type [facility]) [I50.9] Patient Active Problem List   Diagnosis Date Noted   Acute on chronic HFrEF (heart failure with reduced ejection fraction) (HCC) 11/02/2022   Pleural effusion 11/10/2021   Abnormal CT of the chest 11/09/2021   Mediastinal lymphadenopathy 11/09/2021   Acute respiratory failure with hypoxia (HCC) 11/07/2021   Stroke (cerebrum) (HCC) 11/07/2021   Elevated troponin 11/07/2021   Acute metabolic encephalopathy 11/07/2021   Protein  calorie malnutrition (HCC) 11/07/2021   Cardiac volume overload 11/07/2021   Acute systolic heart failure (HCC)    Scrotal pain; chronic, bilateral 07/05/2021   Microscopic hematuria 07/05/2021   Organic impotence 07/05/2021   Necrotizing fasciitis (HCC) 05/22/2019   Opiate overdose (HCC)    Uncontrolled type 2 diabetes mellitus with hyperglycemia (HCC)  05/16/2019   Elevated lactic acid level    Acute renal failure superimposed on stage 3b chronic kidney disease (HCC) 05/15/2019   Cocaine abuse (HCC) 05/15/2019   Severe sepsis (HCC) 05/14/2019   Unstable angina (HCC) 10/16/2018   Apical mural thrombus 10/15/2018   Claudication in peripheral vascular disease (HCC) 11/30/2017   Peripheral arterial disease (HCC) 11/28/2017   Chest pain 11/03/2016   Constipation 11/03/2016   CKD (chronic kidney disease), stage II 08/02/2016   Cellulitis of right hand 08/02/2016   Chronic pain 08/02/2016   AKI (acute kidney injury) (HCC) 01/31/2016   Lobar pneumonia (HCC) 01/31/2016   Influenza A 01/31/2016   Diarrhea 01/30/2016   NSVT (nonsustained ventricular tachycardia) (HCC) 09/12/2015   Precordial chest pain 09/10/2015   Cocaine use 09/04/2015   Near syncope 06/29/2015   Essential hypertension 06/29/2015   Status post coronary artery stent placement 06/29/2015   Insulin dependent diabetes mellitus 06/29/2015   History of MI (myocardial infarction) 06/29/2015   Acute on chronic systolic CHF (congestive heart failure) (HCC) 06/29/2015   Musculoskeletal chest pain 05/27/2015   OSA on CPAP 01/21/2015   Rectal bleeding    Stage 3 chronic renal impairment associated with type 2 diabetes mellitus (HCC) 09/09/2013   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 09/09/2013   Coronary artery disease 09/09/2013   Cardiomyopathy, ischemic-EF 40-45% by echo 09/07/13 09/09/2013   S/P LAD DES June 2014 09/06/2013   Tobacco use disorder 09/06/2013   Hyperglycemia 08/22/2012   NSTEMI (non-ST elevated myocardial infarction) (HCC) 07/07/2012   Dyslipidemia 07/07/2012   Hypertensive heart disease    PCP:  Benetta Spar, MD Pharmacy:   [facility] [place], [place] - [address] [place] [address] Phone: [phone] Fax: [phone]  [facility] [place], [place] - [address] [place]  [address] Phone: [phone] Fax: [phone]  [facility] - [place], [place] - [address] [place] [address] Phone: [phone] Fax: [phone]     Social Determinants of Health (SDOH) Social History: SDOH Screenings   Food Insecurity: No Food Insecurity (11/02/2022)  Housing: Low Risk  (11/02/2022)  Transportation Needs: No Transportation Needs (11/02/2022)  Utilities: Not At Risk (11/02/2022)  Tobacco Use: High Risk (11/02/2022)   SDOH Interventions:     Readmission Risk Interventions    11/04/2022   10:22 AM 10/21/2022    1:05 PM  Readmission Risk Prevention Plan  Transportation Screening Complete Complete  HRI or Home Care Consult Complete Complete  Social Work Consult for Recovery Care Planning/Counseling Complete Complete  Palliative Care Screening Not Applicable Not Applicable  Medication Review Oceanographer) Complete Complete

## 2022-11-04 NOTE — Progress Notes (Signed)
[facility] PROGRESS NOTE  [name] (DOB: 12/20/1955) WNU:[id number] PCP: Benetta Spar, MD  Brief Narrative: RUY DUNKELBERGER is a 67 y.o. male with a history of CAD, tobacco use, HTN, HLD, T2DM, GERD, history of cocaine use, OSA, and combined CHF (LVEF 20-25%, G2DD) who presented to the ED 10/23 with migratory abdominal/back/pelvic pain and dyspnea. He was admitted for recurrent acute on chronic combined CHF, [place] +cocaine. IV diuresis is underway with cardiology consultation.   Subjective: Reports good UOP, though may be incompletely documented as he urinates in toilet independently. States he is itchy with pain meds. Breathing better, swelling improved. He does not mention scrotal pain. Frustrated with fall precautions.  Objective: BP 113/75 (BP Location: Left Arm)   Pulse 88   Temp 98.1 F (36.7 C) (Oral)   Resp 20   Wt 107 kg   SpO2 93%   BMI 34.84 kg/m   Gen: Chronically ill-appearing male in no distress Pulm: Clearing bibasilar crackles, nonlabored upright  CV: RRR, no MRG, improving L > R LE edema GI: Soft, NT, ND, +BS  Neuro: Alert and oriented. No new focal deficits.   Assessment & Plan: Acute hypoxic respiratory failure due to acute on chronic HFrEF, pleural effusions:  - Diurese as below, wean oxygen as tolerated. Still with crackles, though improving.    Acute on chronic combined HFrEF and RV failure:  - Unclear whether he was taking cardiac medications PTA, though he reports he was. Given severity of biventricular failure, cardiology consulted for advice. We will optimize volume status then discharge.  - Monitor I/O (may not be completely charted but subjectively good diuresis), weights, BMP. SCr stable.  - Given cocaine positivity, will stop coreg.   Cocaine abuse: +UDS - Cessation counseling  OSA:  - CPAP qHS   Scrotal pain, history of Fournier's gangrene: U/S reassuring (stable epididymal cyst), ?if hydrocele from overload  contributing. Also note Dx from PCP "scrotal pain, chronic, bilateral" a year ago.  - Will discontinue SGLT2i permanently.   Peripheral neuropathy:  - Continue gabapentin   CAD, HTN: No chest pain. Later did have atypical chest pain likely musculoskeletal with persistently negative troponin and no ischemic ECG changes.  - Continue ASA, statin, BB  Cellulitis: Localized induration in areas where he may have injured the skin. No fluctuance/indication for I&D at this time.  - Treat with keflex.  - Also has likely dermal inclusion cyst on left posterior neck that does not appear infected.   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    T2DM:  - SSI   Tobacco use:  - Nicotine patch - Cessation counseling   Obesity: BMI 36.    Tyrone Nine, MD [facility] [email or web address] 11/04/2022, 2:13 PM

## 2022-11-04 NOTE — Progress Notes (Signed)
Pt refusing bed alarm. Pt reports frequent falls at home, but states "the bathroom is close enough for me to walk to". RN educated pt on fall risk and safety. Pt continues to refuse. Hazeline Junker, MD aware and at bedside.

## 2022-11-04 NOTE — Plan of Care (Signed)
  Problem: Clinical Measurements: Goal: Respiratory complications will improve Outcome: Progressing Goal: Cardiovascular complication will be avoided Outcome: Progressing   Problem: Activity: Goal: Risk for activity intolerance will decrease Outcome: Progressing   Problem: Elimination: Goal: Will not experience complications related to bowel motility Outcome: Progressing Goal: Will not experience complications related to urinary retention Outcome: Progressing   Problem: Pain Management: Goal: General experience of comfort will improve Outcome: Progressing   Problem: Safety: Goal: Ability to remain free from injury will improve Outcome: Progressing   Problem: Skin Integrity: Goal: Risk for impaired skin integrity will decrease Outcome: Progressing

## 2022-11-04 NOTE — Progress Notes (Signed)
Progress Note  Patient Name: [name] Date of Encounter: 11/04/2022  Primary Cardiologist: Reatha Harps, MD  Interval Summary   Chart reviewed, patient did complain of some shortness of breath overnight, resolved this morning.  He is lying supine in bed.  Still with tightness in his legs and mild discomfort in his scrotum.  Vital Signs    Vitals:   11/03/22 2049 11/04/22 0500 11/04/22 0640 11/04/22 0825  BP: 115/73  111/74   Pulse: 79  77   Resp: 18  20   Temp: 97.9 F (36.6 C)  97.8 F (36.6 C)   TempSrc: Oral  Oral   SpO2: 100%  100% 96%  Weight:  107 kg      Intake/Output Summary (Last 24 hours) at 11/04/2022 0943 Last data filed at 11/03/2022 1856 Gross per 24 hour  Intake --  Output 600 ml  Net -600 ml   Filed Weights   11/03/22 0500 11/04/22 0500  Weight: 105.3 kg 107 kg    Physical Exam   GEN: No acute distress.   Neck: Difficult to assess JVP. Cardiac: RRR, no murmur or gallop.  Respiratory: Nonlabored.  Decreased breath sounds at the bases. GI: Protuberant. MS: 2+ leg edema.  ECG/Telemetry    Telemetry reviewed showing sinus rhythm with rare PVCs.  Labs    Chemistry Recent Labs  Lab 11/01/22 1217 11/02/22 0822 11/03/22 0428 11/04/22 0744  NA 137 139 138 135  K 3.7 4.2 3.6 4.2  CL 104 105 103 100  CO2 21* 26 28 29   GLUCOSE 142* 119* 135* 117*  BUN 24* 19 19 20   CREATININE 1.42* 1.34* 1.60* 1.63*  CALCIUM 8.7* 8.4* 7.8* 7.9*  PROT 9.2* 8.0  --   --   ALBUMIN 3.2* 2.8*  --   --   AST 24 25  --   --   ALT 20 16  --   --   ALKPHOS 118 95  --   --   BILITOT 2.3* 1.6*  --   --   GFRNONAA 54* 58* 47* 46*  ANIONGAP 12 8 7 6     Hematology Recent Labs  Lab 11/01/22 1217 11/02/22 0822  WBC 11.9* 10.1  RBC 4.54 4.15*  HGB 10.0* 9.3*  HCT 35.5* 32.6*  MCV 78.2* 78.6*  MCH 22.0* 22.4*  MCHC 28.2* 28.5*  RDW 22.1* 22.0*  PLT 325 259   Cardiac Enzymes Recent Labs  Lab 10/21/22 0908 10/21/22 1155 11/02/22 0822  11/02/22 1019 11/02/22 1757  TROPONINIHS 10 10 16  18* 16   Lipid Panel     Component Value Date/Time   CHOL 124 11/10/2021 0347   TRIG 43 11/10/2021 0347   HDL 24 (L) 11/10/2021 0347   CHOLHDL 5.2 11/10/2021 0347   VLDL 9 11/10/2021 0347   LDLCALC 91 11/10/2021 0347    Cardiac Studies   Echocardiogram 10/21/2022:  1. Left ventricular ejection fraction, by estimation, is 20 to 25%. The  left ventricle has severely decreased function. The left ventricle  demonstrates global hypokinesis. Left ventricular diastolic parameters are  consistent with Grade II diastolic  dysfunction (pseudonormalization). Elevated left atrial pressure. There is  the interventricular septum is flattened in systole and diastole,  consistent with right ventricular pressure and volume overload.   2. RV not well viusalized. Grossly appears mildly enlarged with mildly  decreased function. Right ventricular systolic function was not well  visualized. The right ventricular size is not well visualized. Tricuspid  regurgitation signal is inadequate  for  assessing PA pressure.   3. Left atrial size was mildly dilated.   4. The mitral valve is abnormal. Mild mitral valve regurgitation. No  evidence of mitral stenosis.   5. The tricuspid valve is abnormal.   6. The aortic valve is tricuspid. There is mild calcification of the  aortic valve. There is mild thickening of the aortic valve. Aortic valve  regurgitation is not visualized. No aortic stenosis is present.   7. The pulmonic valve was abnormal.   Assessment & Plan   1.  Acute on chronic HFrEF with LVEF 20 to 25% and mild RV dysfunction by recent echocardiogram.  Management complicated by noncompliance with both follow-up and regular medical therapy in addition to recent cocaine use.  Plan at this time is attempted optimization of fluid status and then at least basic GDMT.  He was taken off Coreg and Jardiance as discussed in the consultation note.  Recent  systolics 110s to 115.  Intake and output incomplete.  2.  CAD (s/p prior LAD stenting, DES to RCA in 08/2015, patent stent by repeat cath in 04/2016 with diffuse nonobstructive CAD along distal-LAD, OM2 and PDA, cath in 01/2018 showing multivessel CAD and not felt to be candidate for CABG and PCI was not pursued, cath in 03/2019 with DES to PDA and distal RCA with medical management recommended of residual disease).  No definite angina at this time or clear evidence of ACS.  Continue aspirin and Lipitor.  3.  CKD stage IIIb with AKI.  Creatinine relatively stable at 1.63.  4.  Substance abuse, UDS positive for cocaine.  5.  History of LV apical mural thrombus, no longer on anticoagulation.  Recent echocardiogram did not describe active thrombus.  Continue aspirin, Lipitor, and Lasix currently at 40 mg IV twice daily.  Not candidate for SGLT2 inhibitors given prior history of Fournier's gangrene.  Present blood pressure may limit further GDMT, would try and optimize fluid status prior to adding further medications.  Might be able to go on low-dose Aldactone eventually and potentially low-dose Coreg.  For questions or updates, please contact [facility] Please consult [email or web address] for contact info under   Signed, Nona Dell, MD  11/04/2022, 9:43 AM

## 2022-11-05 ENCOUNTER — Inpatient Hospital Stay (HOSPITAL_COMMUNITY): Payer: No Typology Code available for payment source

## 2022-11-05 DIAGNOSIS — I5023 Acute on chronic systolic (congestive) heart failure: Secondary | ICD-10-CM | POA: Diagnosis not present

## 2022-11-05 LAB — BASIC METABOLIC PANEL
Anion gap: 7 (ref 5–15)
BUN: 26 mg/dL — ABNORMAL HIGH (ref 8–23)
CO2: 27 mmol/L (ref 22–32)
Calcium: 7.9 mg/dL — ABNORMAL LOW (ref 8.9–10.3)
Chloride: 102 mmol/L (ref 98–111)
Creatinine, Ser: 1.72 mg/dL — ABNORMAL HIGH (ref 0.61–1.24)
GFR, Estimated: 43 mL/min — ABNORMAL LOW (ref >60)
Glucose, Bld: 108 mg/dL — ABNORMAL HIGH (ref 70–99)
Potassium: 4 mmol/L (ref 3.5–5.1)
Sodium: 136 mmol/L (ref 135–145)

## 2022-11-05 LAB — GLUCOSE, CAPILLARY
Glucose-Capillary: 111 mg/dL — ABNORMAL HIGH (ref 70–99)
Glucose-Capillary: 181 mg/dL — ABNORMAL HIGH (ref 70–99)
Glucose-Capillary: 124 mg/dL — ABNORMAL HIGH (ref 70–99)
Glucose-Capillary: 132 mg/dL — ABNORMAL HIGH (ref 70–99)

## 2022-11-05 LAB — AMMONIA: Ammonia: 14 umol/L (ref 9–35)

## 2022-11-05 LAB — HEPATIC FUNCTION PANEL
ALT: 14 U/L (ref 0–44)
AST: 16 U/L (ref 15–41)
Albumin: 2.8 g/dL — ABNORMAL LOW (ref 3.5–5.0)
Alkaline Phosphatase: 98 U/L (ref 38–126)
Bilirubin, Direct: 0.5 mg/dL — ABNORMAL HIGH (ref 0.0–0.2)
Indirect Bilirubin: 0.7 mg/dL (ref 0.3–0.9)
Total Bilirubin: 1.2 mg/dL (ref 0.3–1.2)
Total Protein: 8 g/dL (ref 6.5–8.1)

## 2022-11-05 NOTE — TOC Progression Note (Signed)
Transition of Care [facility]) - Progression Note    Patient Details  Name: [name] MRN: [id number] Date of Birth: 1955/11/13  Transition of Care [facility]) CM/SW Contact  Catalina Gravel, [place] Phone Number: 11/05/2022, 11:40 AM  Clinical Narrative:    CSW sent Campbell to MD requesting HH orders.  Pt active with [facility] and can resume care once DC.  TOC to follow.    Expected Discharge Plan: Home w Home Health Services Barriers to Discharge: Continued Medical Work up  Expected Discharge Plan and Services In-house Referral: Clinical Social Work Discharge Planning Services: CM Consult Post Acute Care Choice: Home Health Living arrangements for the past 2 months: Single Family Home                                       Social Determinants of Health (SDOH) Interventions SDOH Screenings   Food Insecurity: No Food Insecurity (11/02/2022)  Housing: Low Risk  (11/02/2022)  Transportation Needs: No Transportation Needs (11/02/2022)  Utilities: Not At Risk (11/02/2022)  Tobacco Use: High Risk (11/02/2022)    Readmission Risk Interventions    11/04/2022   10:22 AM 10/21/2022    1:05 PM  Readmission Risk Prevention Plan  Transportation Screening Complete Complete  HRI or Home Care Consult Complete Complete  Social Work Consult for Recovery Care Planning/Counseling Complete Complete  Palliative Care Screening Not Applicable Not Applicable  Medication Review Oceanographer) Complete Complete

## 2022-11-05 NOTE — Progress Notes (Signed)
[facility] PROGRESS NOTE  [name] (DOB: 1955-02-08) [id number] PCP: Benetta Spar, MD  Brief Narrative: [name] is a 67 y.o. male with a history of CAD, tobacco use, HTN, HLD, T2DM, GERD, history of cocaine use, OSA, and combined CHF (LVEF 20-25%, G2DD) who presented to the ED 10/23 with migratory abdominal/back/pelvic pain and dyspnea. He was admitted for recurrent acute on chronic combined CHF, [place] +cocaine. IV diuresis is underway with cardiology consultation. Developed delirium 10/25, head CT nonacute.   Subjective: Got up into the hall last night naked trying to go home, was moved closer to nurses station, given ativan, now somnolent, denies any problems but does not answer most questions.   Objective: BP 115/66 (BP Location: Left Arm)   Pulse 75   Temp 98 F (36.7 C) (Oral)   Resp 16   Wt 106.9 kg   SpO2 96%   BMI 34.80 kg/m   Gen: Chronically ill-appearing male snoring Pulm: Clear at bases, nonlabored on room air  CV: RRR, +LE edema GI: Soft, NT, ND, +BS  Neuro: Rousable, not cooperative with exam. Ext: Warm, no deformities Skin: No new rashes, lesions or ulcers on visualized skin   Assessment & Plan: Acute hypoxic respiratory failure due to acute on chronic HFrEF, pleural effusions:  - Diurese as below, wean oxygen as tolerated. Crackles improved.    Acute metabolic encephalopathy, delirium: Possibly element of hospital delirium and/or cocaine withdrawal.  - CT head >> no acute findings, though chronic microvascular disease and meningioma decrease threshold for confusion.  - LFT's/ammonia wnl.  - No urinary retention noted, will scan PVR to confirm given poor urine output documentation, mild Cr bump and confusion.  - Keep delirium precautions and minimize sedating medications if able  Acute on chronic combined HFrEF and RV failure:  - Unclear whether he was taking cardiac medications PTA, though he reports he was. Given severity of  biventricular failure, cardiology consulted for advice. We will optimize volume status then discharge.  - Monitor I/O (again not completely charted). Likely transition to po lasix 10/27.  - Given cocaine positivity, stopped beta blocker  Cocaine abuse: +UDS - Cessation counseling  OSA:  - CPAP qHS   Scrotal pain, history of Fournier's gangrene: U/S reassuring (stable epididymal cyst), ?if hydrocele from overload contributing. Also note Dx from PCP "scrotal pain, chronic, bilateral" a year ago.  - Will discontinue SGLT2i permanently. - Supportive care, urology follow up.   Peripheral neuropathy:  - Continue gabapentin   CAD, HTN: No chest pain. Later did have atypical chest pain likely musculoskeletal with persistently negative troponin and no ischemic ECG changes.  - Continue ASA, statin, BB  Cellulitis: Localized induration in areas where he may have injured the skin. No fluctuance/indication for I&D at this time.  - Treat with keflex.  - Also has likely dermal inclusion cyst on left posterior neck that does not appear infected.   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    T2DM:  - SSI   Tobacco use:  - Nicotine patch - Cessation counseling   Obesity: BMI 36.    Tyrone Nine, MD [facility] [email or web address] 11/05/2022, 2:31 PM

## 2022-11-05 NOTE — Plan of Care (Signed)
  Problem: Self-Care: Goal: Verbalization of feelings and concerns over difficulty with self-care will improve Outcome: Not Progressing   Problem: Self-Care: Goal: Ability to participate in self-care as condition permits will improve Outcome: Not Progressing   Problem: Activity: Goal: Risk for activity intolerance will decrease Outcome: Not Progressing   Problem: Safety: Goal: Ability to remain free from injury will improve Outcome: Not Progressing

## 2022-11-05 NOTE — Progress Notes (Signed)
At 0148, patient started wandering the hallway, states he was looking for his bathroom, placed back on his bed in his room and bed alarm turned on even though patient had strongly refused in dayshift as documented. Relocated patient to a room nearer to the station for safety.

## 2022-11-06 ENCOUNTER — Inpatient Hospital Stay (HOSPITAL_COMMUNITY): Payer: No Typology Code available for payment source

## 2022-11-06 DIAGNOSIS — I5023 Acute on chronic systolic (congestive) heart failure: Secondary | ICD-10-CM | POA: Diagnosis not present

## 2022-11-06 LAB — BASIC METABOLIC PANEL
Anion gap: 8 (ref 5–15)
BUN: 28 mg/dL — ABNORMAL HIGH (ref 8–23)
CO2: 25 mmol/L (ref 22–32)
Calcium: 7.8 mg/dL — ABNORMAL LOW (ref 8.9–10.3)
Chloride: 101 mmol/L (ref 98–111)
Creatinine, Ser: 1.58 mg/dL — ABNORMAL HIGH (ref 0.61–1.24)
GFR, Estimated: 47 mL/min — ABNORMAL LOW (ref >60)
Glucose, Bld: 154 mg/dL — ABNORMAL HIGH (ref 70–99)
Potassium: 3.7 mmol/L (ref 3.5–5.1)
Sodium: 134 mmol/L — ABNORMAL LOW (ref 135–145)

## 2022-11-06 LAB — GLUCOSE, CAPILLARY
Glucose-Capillary: 147 mg/dL — ABNORMAL HIGH (ref 70–99)
Glucose-Capillary: 119 mg/dL — ABNORMAL HIGH (ref 70–99)
Glucose-Capillary: 138 mg/dL — ABNORMAL HIGH (ref 70–99)
Glucose-Capillary: 149 mg/dL — ABNORMAL HIGH (ref 70–99)

## 2022-11-06 MED ORDER — LORAZEPAM 2 MG/ML IJ SOLN
1.0000 mg | Freq: Once | INTRAMUSCULAR | Status: AC | PRN
  Administered 2022-11-06: 1 mg via INTRAVENOUS
  Filled 2022-11-06: qty 1

## 2022-11-06 MED ORDER — GADOBUTROL 1 MMOL/ML IV SOLN
10.0000 mL | Freq: Once | INTRAVENOUS | Status: AC | PRN
  Administered 2022-11-06: 10 mL via INTRAVENOUS

## 2022-11-06 MED ORDER — LIDOCAINE 5 % EX PTCH
1.0000 | MEDICATED_PATCH | CUTANEOUS | Status: DC
  Administered 2022-11-06 – 2022-11-15 (×10): 1 via TRANSDERMAL
  Filled 2022-11-06 (×10): qty 1

## 2022-11-06 MED ORDER — FUROSEMIDE 40 MG PO TABS
40.0000 mg | ORAL_TABLET | Freq: Two times a day (BID) | ORAL | Status: DC
Start: 2022-11-06 — End: 2022-11-07
  Administered 2022-11-06 – 2022-11-07 (×2): 40 mg via ORAL
  Filled 2022-11-06 (×2): qty 1

## 2022-11-06 NOTE — Progress Notes (Signed)
[facility] PROGRESS NOTE  [name] (DOB: 03/21/1955) [id number]:[id number] PCP: Benetta Spar, MD  Brief Narrative: [name] is a 67 y.o. male with a history of CAD, tobacco use, HTN, HLD, T2DM, GERD, history of cocaine use, OSA, and combined CHF (LVEF 20-25%, G2DD) who presented to the ED 10/23 with migratory abdominal/back/pelvic pain and dyspnea. He was admitted for recurrent acute on chronic combined CHF, [place] +cocaine. IV diuresis is underway with cardiology consultation. Developed delirium 10/25, head CT nonacute. Mentation has cleared up a bit but not at baseline.  Subjective: Came to his senses last night he thinks. He's oriented today, denies localized weakness or numbness, but feels he's been drooling at times which is new. No headache reported. Speech unchanged per his report. Still eating ok, ready to eat lunch now. He agrees that I can speak with his daughter by phone. No one at bedside.   Objective: BP 108/69 (BP Location: Right Arm)   Pulse 74   Temp 97.7 F (36.5 C)   Resp 18   Wt 106.9 kg   SpO2 94%   BMI 34.80 kg/m   Gen: Chronically ill-appearing male in no acute distress laying completely flat in bed Pulm: Clear, nonlabored off oxygen  CV: RRR, stable LE edema  GI: Soft, NT, ND, +BS  Neuro: Alert, bradyphrenic (not normal) but oriented, slight facial asymmetry without overt droop, speech stable, no focal deficits on exam at this time. Ext: Warm, no deformities Skin: No new rashes on visualized skin   Assessment & Plan: Acute hypoxic respiratory failure due to acute on chronic HFrEF, pleural effusions:  - Diurese as below, wean oxygen as tolerated. Crackles improved.    Acute metabolic encephalopathy, delirium: Possibly element of hospital delirium and/or cocaine withdrawal.  - CT head >> no acute findings, though chronic microvascular disease and meningioma decrease threshold for confusion.  - LFT's/ammonia wnl.  - No urinary retention  noted, will scan PVR to confirm given poor urine output documentation, mild Cr bump and confusion.  - Keep delirium precautions and minimize sedating medications if able  Acute on chronic combined HFrEF and RV failure:  - Unclear whether he was taking cardiac medications PTA, though he reports he was. Given severity of biventricular failure, cardiology consulted for advice. We will optimize volume status then discharge.  - Monitor I/O (actually turned a positive balance when PM lasix dose held). Will transition to po lasix 10/27.  - Given cocaine positivity, stopped beta blocker  Cocaine abuse: +UDS - Cessation counseling  OSA:  - CPAP qHS   Scrotal pain, history of Fournier's gangrene: U/S reassuring (stable epididymal cyst), ?if hydrocele from overload contributing. Also note Dx from PCP "scrotal pain, chronic, bilateral" a year ago.  - Will discontinue SGLT2i permanently. - Supportive care, urology follow up.   Peripheral neuropathy:  - Continue gabapentin   CAD, HTN: No chest pain. Later did have atypical chest pain likely musculoskeletal with persistently negative troponin and no ischemic ECG changes.  - Continue ASA, statin, BB  Cellulitis: Localized induration in areas where he may have injured the skin. No fluctuance/indication for I&D at this time.  - Treat with keflex.  - Also has likely dermal inclusion cyst on left posterior neck that does not appear infected. Given the specter of masses, we could consider biopsy of this too, though it is very characteristic of benign cyst.  Meningioma: Extra-axial brain mass known since 2023. Had mediastinal lymphadenopathy and pleural effusion sampled during that admission which  did not show malignancy. Plan per neurosurgery was to biopsy brain mass if those were negative.  - Given persistent encephalopathy and cerebral edema suggested by CT, we will get MRI w/ and w/o contrast (ok to occur at [facility] on 10/28), then solicit NSG opinion  thereafter.D/w daughter, will give ativan on call to scanner for sedation due to severe claustrophobia.   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    T2DM:  - SSI   Tobacco use:  - Nicotine patch - Cessation counseling   Obesity: BMI 36.    Tyrone Nine, MD [facility] [email or web address] 11/06/2022, 12:08 PM

## 2022-11-07 ENCOUNTER — Inpatient Hospital Stay (HOSPITAL_COMMUNITY): Payer: No Typology Code available for payment source

## 2022-11-07 DIAGNOSIS — I739 Peripheral vascular disease, unspecified: Secondary | ICD-10-CM | POA: Diagnosis not present

## 2022-11-07 DIAGNOSIS — I255 Ischemic cardiomyopathy: Secondary | ICD-10-CM

## 2022-11-07 DIAGNOSIS — N179 Acute kidney failure, unspecified: Secondary | ICD-10-CM

## 2022-11-07 DIAGNOSIS — I2510 Atherosclerotic heart disease of native coronary artery without angina pectoris: Secondary | ICD-10-CM

## 2022-11-07 DIAGNOSIS — D329 Benign neoplasm of meninges, unspecified: Secondary | ICD-10-CM

## 2022-11-07 DIAGNOSIS — L97521 Non-pressure chronic ulcer of other part of left foot limited to breakdown of skin: Secondary | ICD-10-CM | POA: Diagnosis not present

## 2022-11-07 DIAGNOSIS — I5023 Acute on chronic systolic (congestive) heart failure: Secondary | ICD-10-CM | POA: Diagnosis not present

## 2022-11-07 DIAGNOSIS — F1410 Cocaine abuse, uncomplicated: Secondary | ICD-10-CM

## 2022-11-07 LAB — GLUCOSE, CAPILLARY
Glucose-Capillary: 109 mg/dL — ABNORMAL HIGH (ref 70–99)
Glucose-Capillary: 169 mg/dL — ABNORMAL HIGH (ref 70–99)
Glucose-Capillary: 119 mg/dL — ABNORMAL HIGH (ref 70–99)
Glucose-Capillary: 226 mg/dL — ABNORMAL HIGH (ref 70–99)

## 2022-11-07 LAB — BASIC METABOLIC PANEL
Anion gap: 10 (ref 5–15)
BUN: 31 mg/dL — ABNORMAL HIGH (ref 8–23)
CO2: 25 mmol/L (ref 22–32)
Calcium: 8.1 mg/dL — ABNORMAL LOW (ref 8.9–10.3)
Chloride: 101 mmol/L (ref 98–111)
Creatinine, Ser: 1.43 mg/dL — ABNORMAL HIGH (ref 0.61–1.24)
GFR, Estimated: 53 mL/min — ABNORMAL LOW (ref >60)
Glucose, Bld: 110 mg/dL — ABNORMAL HIGH (ref 70–99)
Potassium: 4.4 mmol/L (ref 3.5–5.1)
Sodium: 136 mmol/L (ref 135–145)

## 2022-11-07 LAB — BLOOD GAS, VENOUS
Acid-Base Excess: 2.9 mmol/L — ABNORMAL HIGH (ref 0.0–2.0)
Bicarbonate: 29.8 mmol/L — ABNORMAL HIGH (ref 20.0–28.0)
Drawn by: 51174
O2 Saturation: 33.3 %
Patient temperature: 36.4
pCO2, Ven: 53 mm[Hg] (ref 44–60)
pH, Ven: 7.36 (ref 7.25–7.43)
pO2, Ven: <31 mm[Hg] — CL (ref 32–45)

## 2022-11-07 MED ORDER — DEXAMETHASONE 4 MG PO TABS
4.0000 mg | ORAL_TABLET | Freq: Two times a day (BID) | ORAL | Status: AC
  Administered 2022-11-07 – 2022-11-08 (×4): 4 mg via ORAL
  Filled 2022-11-07 (×4): qty 1

## 2022-11-07 MED ORDER — FUROSEMIDE 10 MG/ML IJ SOLN
40.0000 mg | Freq: Once | INTRAMUSCULAR | Status: AC
Start: 2022-11-07 — End: 2022-11-07
  Administered 2022-11-07: 40 mg via INTRAVENOUS
  Filled 2022-11-07: qty 4

## 2022-11-07 MED ORDER — FUROSEMIDE 40 MG PO TABS
40.0000 mg | ORAL_TABLET | Freq: Two times a day (BID) | ORAL | Status: DC
  Administered 2022-11-08: 40 mg via ORAL
  Filled 2022-11-07: qty 1

## 2022-11-07 NOTE — Progress Notes (Signed)
   11/07/22 1110  ReDS Vest / Clip  Station Marker D  Ruler Value 40  ReDS Value Range (!) > 40  ReDS Actual Value 50

## 2022-11-07 NOTE — Progress Notes (Signed)
Lab called critical lab pO2 less than 31. MD Jarvis Newcomer made aware.

## 2022-11-07 NOTE — Progress Notes (Signed)
Mobility Specialist Progress Note:    11/07/22 1430  Mobility  Activity Ambulated with assistance in hallway  Level of Assistance Modified independent, requires aide device or extra time  Assistive Device None  Distance Ambulated (ft) 60 ft  Range of Motion/Exercises Active;All extremities  Activity Response Tolerated well  Mobility Referral Yes  $Mobility charge 1 Mobility  Mobility Specialist Start Time (ACUTE ONLY) 1430  Mobility Specialist Stop Time (ACUTE ONLY) 1450  Mobility Specialist Time Calculation (min) (ACUTE ONLY) 20 min   Pt received in bed, nurse in room. Agreeable to mobility, ModI and hand-held assist to stand and ambulate with no AD. Tolerated well, pt is unsteady and needs help with balance. Returned pt to room, left supine. Bed alarm on, call bell in reach. All needs met.   [name] Mobility Specialist Please contact via Special educational needs teacher or  Rehab office at [phone]

## 2022-11-07 NOTE — Plan of Care (Signed)
  Problem: Education: Goal: Knowledge of General Education information will improve Description Including pain rating scale, medication(s)/side effects and non-pharmacologic comfort measures Outcome: Progressing   Problem: Health Behavior/Discharge Planning: Goal: Ability to manage health-related needs will improve Outcome: Progressing   

## 2022-11-07 NOTE — Progress Notes (Signed)
Patient alert with episodes of drowsiness. Encouraged patient to ambulate in hallway, patient ambulated with two assist, noted patient unsteady with gait. Later encouraged patient to ambulate in the hallway with staff, patient stated he would if he could have a snack before and after getting up. Patient has asked several times during shift for crackers and ice cream. MD Jarvis Newcomer made aware.  Continues on oxygen at 2 liters.

## 2022-11-07 NOTE — Progress Notes (Signed)
Progress Note  Patient Name: [name] Date of Encounter: 11/07/2022  Primary Cardiologist: Reatha Harps, MD  Subjective   Continues to have SOB and reports he cannot walk in the hallway without feeling short of breath.  Still has some discomfort in his scrotum.  Inpatient Medications    Scheduled Meds:  aspirin EC  81 mg Oral Daily   atorvastatin  40 mg Oral QHS   cephALEXin  500 mg Oral Q8H   enoxaparin (LOVENOX) injection  40 mg Subcutaneous Q24H   furosemide  40 mg Oral BID   gabapentin  300 mg Oral TID   insulin aspart  0-15 Units Subcutaneous TID WC   insulin aspart  0-5 Units Subcutaneous QHS   lidocaine  1 patch Transdermal Q24H   nicotine  21 mg Transdermal Daily   pantoprazole  40 mg Oral Daily   polyethylene glycol  17 g Oral BID   sodium chloride flush  3 mL Intravenous Q12H   Continuous Infusions:  PRN Meds: acetaminophen **OR** acetaminophen, hydrOXYzine, methocarbamol, nitroGLYCERIN, senna   Vital Signs    Vitals:   11/06/22 2200 11/07/22 0331 11/07/22 0540 11/07/22 0906  BP:  (!) 122/93  113/83  Pulse:  79  79  Resp:  20  20  Temp:  98.5 F (36.9 C)  (!) 97.5 F (36.4 C)  TempSrc:    Oral  SpO2: 97% 92%  97%  Weight:   108.7 kg     Intake/Output Summary (Last 24 hours) at 11/07/2022 1029 Last data filed at 11/07/2022 0830 Gross per 24 hour  Intake 970 ml  Output 1300 ml  Net -330 ml   Filed Weights   11/04/22 0500 11/05/22 0519 11/07/22 0540  Weight: 107 kg 106.9 kg 108.7 kg    Telemetry     Personally reviewed.  ECG      Physical Exam   GEN: No acute distress.   Neck: JVD unable to examine due to body habitus Cardiac: RRR, no murmur, rub, or gallop.  Respiratory: Nonlabored. Clear to auscultation bilaterally. GI: Soft, nontender, bowel sounds present. MS: 1+ pitting edema in bilateral lower EXTR; No deformity. Neuro:  Nonfocal. Psych: Alert and oriented x 3. Normal affect.  Labs    Chemistry Recent Labs   Lab 11/01/22 1217 11/02/22 1610 11/03/22 0428 11/05/22 0410 11/05/22 1122 11/06/22 0402 11/07/22 0419  NA 137 139   < > 136  --  134* 136  K 3.7 4.2   < > 4.0  --  3.7 4.4  CL 104 105   < > 102  --  101 101  CO2 21* 26   < > 27  --  25 25  GLUCOSE 142* 119*   < > 108*  --  154* 110*  BUN 24* 19   < > 26*  --  28* 31*  CREATININE 1.42* 1.34*   < > 1.72*  --  1.58* 1.43*  CALCIUM 8.7* 8.4*   < > 7.9*  --  7.8* 8.1*  PROT 9.2* 8.0  --   --  8.0  --   --   ALBUMIN 3.2* 2.8*  --   --  2.8*  --   --   AST 24 25  --   --  16  --   --   ALT 20 16  --   --  14  --   --   ALKPHOS 118 95  --   --  98  --   --  BILITOT 2.3* 1.6*  --   --  1.2  --   --   GFRNONAA 54* 58*   < > 43*  --  47* 53*  ANIONGAP 12 8   < > 7  --  8 10   < > = values in this interval not displayed.     Hematology Recent Labs  Lab 11/01/22 1217 11/02/22 0822  WBC 11.9* 10.1  RBC 4.54 4.15*  HGB 10.0* 9.3*  HCT 35.5* 32.6*  MCV 78.2* 78.6*  MCH 22.0* 22.4*  MCHC 28.2* 28.5*  RDW 22.1* 22.0*  PLT 325 259    Cardiac Enzymes Recent Labs  Lab 10/21/22 0908 10/21/22 1155 11/02/22 0822 11/02/22 1019 11/02/22 1757  TROPONINIHS 10 10 16  18* 16    BNP Recent Labs  Lab 11/02/22 0822 11/03/22 0428  BNP 1,424.0* 1,220.0*     DDimerNo results for input(s): "DDIMER" in the last 168 hours.    Assessment & Plan   Acute on chronic systolic heart failure Ischemic cardiomyopathy (LVEF 20 to 25% with no device) Multivessel CAD in 2020 (not a candidate for CABG and PCI), s/p PCI to RPDA in 2021 on medical management Chronic occlusion of L common iliac artery with reconstitution at the L CFA, ?L toe ulcer AKI on CKD stage IIIb, resolved Substance abuse (UDS positive for cocaine) History of LV apical mural thrombus, resolved Meningioma with chronic cerebral edema, no midline shift   -Continue p.o. Lasix 40 mg twice daily, GDMT titration limited by soft blood pressures. Otherwise, continue cardio  prudent medications with aspirin 81 mg once daily and atorvastatin 40 mg nightly. Per review of the clinic note from 08/2021, he did not want to take any medications and stopped all GDMT. -He has slight discoloration of L great toe and ?? toe ulcer, imaging with CT chest/abd/pelvis showed chronic occlusion of L CIA with reconstitution at L CFA. Will need to obtain CT pelvis and LE run-off with contrast to determine the arterial patency. Difficult to palpate due to foot swelling.      Signed, Marjo Bicker, MD  11/07/2022, 10:29 AM

## 2022-11-07 NOTE — Progress Notes (Signed)
[facility] PROGRESS NOTE  TERRYON ECKENRODE (DOB: [birth year]) [id number] PCP: Benetta Spar, MD  Brief Narrative: [name] is a 67 y.o. male with a history of CAD, tobacco use, HTN, HLD, T2DM, GERD, history of cocaine use, OSA, and combined CHF (LVEF 20-25%, G2DD) who presented to the ED 10/23 with migratory abdominal/back/pelvic pain and dyspnea. He was admitted for recurrent acute on chronic combined CHF, [place] +cocaine. IV diuresis is underway with cardiology consultation. Developed delirium 10/25, head CT nonacute. Mentation has cleared up a bit but not at baseline. CBG shows no new hypercarbia. MR brain shows chronic meningioma with edema without midline shift.   Subjective: He's more awake today, though continues to be sleepier than previous. He wants nabs. Denies dyspnea but had to be put back on oxygen. No chest pain or other issues currently. Denies localized numbness or weakness but did start drooling as discussed previously.   Objective: BP 113/83 (BP Location: Left Arm)   Pulse 79   Temp (!) 97.5 F (36.4 C) (Oral)   Resp 20   Wt 108.7 kg   SpO2 97%   BMI 35.38 kg/m   Gen: Chronically ill-appearing male sitting in chair having eaten 100% of lunch Pulm: Diminished at bases with crackles L > R, mild tachypnea  CV: RRR, increased LE edema from yesterday GI: Soft, NT, ND, +BS  Neuro: Drowsy but rousable and interactive. No new focal deficits. Ext: Warm, no deformities.   Assessment & Plan: Acute hypoxic respiratory failure due to acute on chronic HFrEF, pleural effusions:  - Diurese as below, wean oxygen as tolerated. Crackles improved though today recurrent in setting of backing off on diuresis. Back on oxygen. VBG shows stable pCO2 from priors with normal pH. D/w RN to aim for SpO2 no higher than 95%. CXR shows hypoventilation, probable left pleural effusion and vascular congestion. Give IV lasix this afternoon, use incentive spirometry, ambulate the  patient as atelectasis is contributing.   Acute metabolic encephalopathy, delirium: Possibly element of hospital delirium and/or cocaine withdrawal.  - CT head >> no acute findings, though chronic microvascular disease and meningioma decrease threshold for confusion.  - LFT's/ammonia wnl. VBG without acute hypercarbia. - No urinary retention noted per RN - Keep delirium precautions and minimize sedating medications if able  Acute on chronic combined HFrEF and RV failure:  - Unclear whether he was taking cardiac medications PTA, though he reports he was. Given severity of biventricular failure, cardiology consulted for advice. We will optimize volume status then discharge.  - Monitor I/O (actually turned a positive balance when PM lasix dose held). Transitioned to po lasix, though exam is consistent with pulmonary edema, REDS vest elevated, CXR concordant, will give IV lasix this afternoon.  - Given cocaine positivity, stopped beta blocker  Meningioma: Extra-axial brain mass known since 2023. Had mediastinal lymphadenopathy and pleural effusion sampled during that admission which did not show malignancy. Plan per neurosurgery was to biopsy brain mass if those were negative.  - Given persistent encephalopathy and cerebral edema suggested by CT, we checked MRI w/ and w/o contrast which shows increased size in meningioma with chronic cerebral edema. I've spoken with Val Eagle, NP on call for neurosurgery who is relaying the consultation to Dr. Maurice Small who initially saw this patient.   Cocaine abuse: +UDS - Cessation counseling  OSA:  - CPAP qHS   Scrotal pain, history of Fournier's gangrene: U/S reassuring (stable epididymal cyst), ?if hydrocele from overload contributing. Also note Dx from PCP "  scrotal pain, chronic, bilateral" a year ago.  - Will discontinue SGLT2i permanently. - Supportive care, urology follow up.   Peripheral neuropathy:  - Continue gabapentin   CAD, HTN: No chest  pain. Later did have atypical chest pain likely musculoskeletal with persistently negative troponin and no ischemic ECG changes.  - Continue ASA, statin, BB  Cellulitis: Localized induration in areas where he may have injured the skin. No fluctuance/indication for I&D at this time.  - Treat with keflex.  - Also has likely dermal inclusion cyst on left posterior neck that does not appear infected. Given the specter of masses, we could consider biopsy of this too, though it is very characteristic of benign cyst.  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    T2DM:  - SSI   Tobacco use:  - Nicotine patch - Cessation counseling   Obesity: BMI 36.    Tyrone Nine, MD [facility] [email or web address] 11/07/2022, 12:39 PM

## 2022-11-08 DIAGNOSIS — I2510 Atherosclerotic heart disease of native coronary artery without angina pectoris: Secondary | ICD-10-CM | POA: Diagnosis not present

## 2022-11-08 DIAGNOSIS — N183 Chronic kidney disease, stage 3 unspecified: Secondary | ICD-10-CM

## 2022-11-08 DIAGNOSIS — I5023 Acute on chronic systolic (congestive) heart failure: Secondary | ICD-10-CM | POA: Diagnosis not present

## 2022-11-08 DIAGNOSIS — I739 Peripheral vascular disease, unspecified: Secondary | ICD-10-CM | POA: Diagnosis not present

## 2022-11-08 DIAGNOSIS — I255 Ischemic cardiomyopathy: Secondary | ICD-10-CM | POA: Diagnosis not present

## 2022-11-08 LAB — BASIC METABOLIC PANEL
Anion gap: 7 (ref 5–15)
BUN: 38 mg/dL — ABNORMAL HIGH (ref 8–23)
CO2: 27 mmol/L (ref 22–32)
Calcium: 8.4 mg/dL — ABNORMAL LOW (ref 8.9–10.3)
Chloride: 98 mmol/L (ref 98–111)
Creatinine, Ser: 1.52 mg/dL — ABNORMAL HIGH (ref 0.61–1.24)
GFR, Estimated: 50 mL/min — ABNORMAL LOW (ref >60)
Glucose, Bld: 262 mg/dL — ABNORMAL HIGH (ref 70–99)
Potassium: 4.6 mmol/L (ref 3.5–5.1)
Sodium: 132 mmol/L — ABNORMAL LOW (ref 135–145)

## 2022-11-08 LAB — IRON AND TIBC
Iron: 32 ug/dL — ABNORMAL LOW (ref 45–182)
Saturation Ratios: 8 % — ABNORMAL LOW (ref 17.9–39.5)
TIBC: 420 ug/dL (ref 250–450)
UIBC: 388 ug/dL

## 2022-11-08 LAB — CBC
HCT: 26.8 % — ABNORMAL LOW (ref 39.0–52.0)
Hemoglobin: 7.8 g/dL — ABNORMAL LOW (ref 13.0–17.0)
MCH: 22.7 pg — ABNORMAL LOW (ref 26.0–34.0)
MCHC: 29.1 g/dL — ABNORMAL LOW (ref 30.0–36.0)
MCV: 77.9 fL — ABNORMAL LOW (ref 80.0–100.0)
Platelets: 252 10*3/uL (ref 150–400)
RBC: 3.44 MIL/uL — ABNORMAL LOW (ref 4.22–5.81)
RDW: 22.4 % — ABNORMAL HIGH (ref 11.5–15.5)
WBC: 9.8 10*3/uL (ref 4.0–10.5)
nRBC: 0 % (ref 0.0–0.2)

## 2022-11-08 LAB — FOLATE: Folate: 7.4 ng/mL (ref >5.9)

## 2022-11-08 LAB — RETICULOCYTES
Immature Retic Fract: 47.4 % — ABNORMAL HIGH (ref 2.3–15.9)
RBC.: 3.5 MIL/uL — ABNORMAL LOW (ref 4.22–5.81)
Retic Count, Absolute: 106.8 10*3/uL (ref 19.0–186.0)
Retic Ct Pct: 3.1 % (ref 0.4–3.1)

## 2022-11-08 LAB — C-REACTIVE PROTEIN: CRP: 1.3 mg/dL — ABNORMAL HIGH (ref <1.0)

## 2022-11-08 LAB — GLUCOSE, CAPILLARY
Glucose-Capillary: 195 mg/dL — ABNORMAL HIGH (ref 70–99)
Glucose-Capillary: 263 mg/dL — ABNORMAL HIGH (ref 70–99)
Glucose-Capillary: 408 mg/dL — ABNORMAL HIGH (ref 70–99)
Glucose-Capillary: 204 mg/dL — ABNORMAL HIGH (ref 70–99)

## 2022-11-08 LAB — BRAIN NATRIURETIC PEPTIDE: B Natriuretic Peptide: 1205 pg/mL — ABNORMAL HIGH (ref 0.0–100.0)

## 2022-11-08 LAB — VITAMIN B12: Vitamin B-12: 576 pg/mL (ref 180–914)

## 2022-11-08 LAB — FERRITIN: Ferritin: 13 ng/mL — ABNORMAL LOW (ref 24–336)

## 2022-11-08 LAB — SEDIMENTATION RATE: Sed Rate: 80 mm/h — ABNORMAL HIGH (ref 0–16)

## 2022-11-08 MED ORDER — INSULIN ASPART 100 UNIT/ML IJ SOLN
5.0000 [IU] | Freq: Three times a day (TID) | INTRAMUSCULAR | Status: DC
  Administered 2022-11-08 – 2022-11-14 (×14): 5 [IU] via SUBCUTANEOUS

## 2022-11-08 MED ORDER — FUROSEMIDE 10 MG/ML IJ SOLN
80.0000 mg | Freq: Two times a day (BID) | INTRAMUSCULAR | Status: DC
  Administered 2022-11-08 – 2022-11-10 (×5): 80 mg via INTRAVENOUS
  Filled 2022-11-08 (×5): qty 8

## 2022-11-08 NOTE — Progress Notes (Signed)
Mobility Specialist Progress Note:    11/08/22 1020  Mobility  Activity Ambulated with assistance in hallway  Level of Assistance Minimal assist, patient does 75% or more  Assistive Device None  Distance Ambulated (ft) 120 ft  Range of Motion/Exercises Active;All extremities  Activity Response Tolerated well  Mobility Referral Yes  $Mobility charge 1 Mobility  Mobility Specialist Start Time (ACUTE ONLY) 1020  Mobility Specialist Stop Time (ACUTE ONLY) 1045  Mobility Specialist Time Calculation (min) (ACUTE ONLY) 25 min   Pt received sitting EOB, agreeable to mobility. Required MinA to stand and ambulate with no AD. Tolerated well, pt is unsteady and needs assistance with balance. SpO2 remained 99% on 2L throughout session. SpO2 95% on RA at EOS, removed  per RN request. Left pt sitting EOB, bed alarm on. All needs met.   [name] Mobility Specialist Please contact via Special educational needs teacher or  Rehab office at [phone]

## 2022-11-08 NOTE — Progress Notes (Addendum)
[facility] PROGRESS NOTE  [name] (DOB: [birth year]) [id number] PCP: Benetta Spar, MD  Brief Narrative: [name] is a 68 y.o. male with a history of CAD, tobacco use, HTN, HLD, T2DM, GERD, history of cocaine use, OSA, and combined CHF (LVEF 20-25%, G2DD) who presented to the ED 10/23 with migratory abdominal/back/pelvic pain and dyspnea. He was admitted for recurrent acute on chronic combined CHF, [place] +cocaine. IV diuresis is underway with cardiology consultation. Developed delirium 10/25, head CT nonacute. Mentation has cleared up a bit but not at baseline. CBG shows no new hypercarbia. MR brain shows chronic meningioma with edema without midline shift.   Subjective: More interactive, improved mobility and feels better. His breathing still labored subjectively, denies chest pain. He says he feels better. No complaints of leg pain or scrotal pain today.  Objective: BP 127/88 (BP Location: Left Arm)   Pulse 86   Temp 97.9 F (36.6 C) (Oral)   Resp 18   Wt 107.8 kg   SpO2 98%   BMI 35.10 kg/m   Gen: Chronically ill-appearing obese male in no distress finishing breakfast at time of exam Pulm: Nonlabored, Crackles at bases improved but still there, no wheezes  CV: RRR, no MRG, distant, LE edema 1+ GI: Soft, NT, ND, +BS  Neuro: Alert and oriented. No new focal deficits. Ext: Warm, no deformities, diminished pulses stable. Skin: No new rashes, lesions or ulcers on visualized skin   Assessment & Plan: Acute hypoxic respiratory failure due to acute on chronic HFrEF, pleural effusions:  - Diurese as below, wean oxygen as tolerated, check ambulatory pulse oximetry today as exam is improved with return to IV lasix. - Continue to stress incentive spirometry, ambulate the patient as atelectasis is contributing.   Acute metabolic encephalopathy, delirium: Possibly element of hospital delirium and/or cocaine withdrawal.  - CT head >> no acute findings, though  chronic microvascular disease and meningioma decrease threshold for confusion.  - LFT's/ammonia wnl. VBG without acute hypercarbia. - No urinary retention noted per RN - Keep delirium precautions and minimize sedating medications if able.  - Mentation slightly improving 10/29 after trial of decadron.  Acute on chronic combined HFrEF and RV failure:  - Unclear whether he was taking cardiac medications PTA, though he reports he was. Given severity of biventricular failure, cardiology consulted for advice. We will optimize volume status then discharge.  - Monitor I/O (actually turned a positive balance when PM lasix dose held), ReDS vest >40% and crackles and hypoxemia returned. Changed back to IV lasix with some mild Cr bump but improved oxygenation. Will plan to continue today and possibly transition back to oral lasix 10/30.   - Given cocaine positivity, stopped beta blocker - Recheck BNP essentially stable.   Meningioma: Extra-axial brain mass known since 2023. Had mediastinal lymphadenopathy and pleural effusion sampled during that admission which did not show malignancy. Plan per neurosurgery was to biopsy brain mass if those were negative.  - Given persistent encephalopathy and cerebral edema suggested by CT, we checked MRI w/ and w/o contrast which shows increased size in meningioma with chronic cerebral edema. I've spoken with Dr. Maurice Small who initially saw this patient. He reviewed updated images and history. Recommendation is for outpatient follow up, though with small size and appearance so characteristic of meningioma, no intervention is anticipated. Given the confusion and cerebral edema, he was ok with short trial of decadron which we have pursued.   Cocaine abuse: +UDS - Cessation counseling  OSA:  -  CPAP qHS   Scrotal pain, history of Fournier's gangrene: U/S reassuring (stable epididymal cyst), ?if hydrocele from overload contributing. Also note Dx from PCP "scrotal pain,  chronic, bilateral" a year ago.  - Will discontinue SGLT2i permanently. - Supportive care, urology follow up.   Peripheral neuropathy:  - Continue gabapentin   CAD, HTN: No chest pain. Later did have atypical chest pain likely musculoskeletal with persistently negative troponin and no ischemic ECG changes.  - Continue ASA, statin, BB  Cellulitis: Localized induration in areas where he may have injured the skin. No fluctuance/indication for I&D at this time.  - Treating with keflex.  - Also has likely dermal inclusion cyst on left posterior neck that does not appear infected.   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  - Needs vascular surgery evaluation as outpatient.    Chronic microcytic anemia: Worsening today though no gross bleeding is noted. BUN up, though likely due to steroids and pt is on PPI.   - Check anemia panel.  - Increase PPI to BID, check FOBT. Only record of GI evaluation was colonoscopy 2012 showing 2 sigmoid polyps, external hemorrhoids.  - Ok to continue lovenox VTE ppx for now unless bleeding is noted.  T2DM:  - SSI. Last HbA1c was 6.3%, seeing some steroid-induced hyperglycemia which we'll continue treating with SSI as no severe ranges occurring and only giving short course of steroid.    Tobacco use:  - Nicotine patch - Cessation counseling   Obesity: BMI 36.    Tyrone Nine, MD [facility] [email or web address] 11/08/2022, 12:13 PM

## 2022-11-08 NOTE — Progress Notes (Signed)
Progress Note  Patient Name: [name] Date of Encounter: 11/08/2022  Primary Cardiologist: Reatha Harps, MD  Subjective   Continues to have DOE while walking in the hallway, -only 2L urine output since admission.  Inpatient Medications    Scheduled Meds:  aspirin EC  81 mg Oral Daily   atorvastatin  40 mg Oral QHS   cephALEXin  500 mg Oral Q8H   dexamethasone  4 mg Oral Q12H   enoxaparin (LOVENOX) injection  40 mg Subcutaneous Q24H   furosemide  80 mg Intravenous BID   gabapentin  300 mg Oral TID   insulin aspart  0-15 Units Subcutaneous TID WC   insulin aspart  0-5 Units Subcutaneous QHS   lidocaine  1 patch Transdermal Q24H   nicotine  21 mg Transdermal Daily   pantoprazole  40 mg Oral Daily   polyethylene glycol  17 g Oral BID   sodium chloride flush  3 mL Intravenous Q12H   Continuous Infusions:  PRN Meds: acetaminophen **OR** acetaminophen, hydrOXYzine, methocarbamol, nitroGLYCERIN, senna   Vital Signs    Vitals:   11/07/22 0540 11/07/22 0906 11/07/22 1325 11/08/22 0500  BP:  113/83 127/88   Pulse:  79 86   Resp:  20 18   Temp:  (!) 97.5 F (36.4 C) 97.9 F (36.6 C)   TempSrc:  Oral Oral   SpO2:  97% 98%   Weight: 108.7 kg   107.8 kg    Intake/Output Summary (Last 24 hours) at 11/08/2022 0939 Last data filed at 11/08/2022 0056 Gross per 24 hour  Intake 540 ml  Output 1300 ml  Net -760 ml   Filed Weights   11/05/22 0519 11/07/22 0540 11/08/22 0500  Weight: 106.9 kg 108.7 kg 107.8 kg    ECG    Not performed today  Physical Exam   GEN: No acute distress.   Neck: JVD unable to examine due to body habitus Cardiac: RRR, no murmur, rub, or gallop.  Respiratory: Nonlabored. Clear to auscultation bilaterally. GI: Soft, nontender, bowel sounds present. MS: 1-2+ pitting edema in bilateral lower EXTR; No deformity. Neuro:  Nonfocal. Psych: Alert and oriented x 3. Normal affect.  Labs    Chemistry Recent Labs  Lab 11/01/22 1217  11/02/22 1610 11/03/22 0428 11/05/22 1122 11/06/22 0402 11/07/22 0419 11/08/22 0448  NA 137 139   < >  --  134* 136 132*  K 3.7 4.2   < >  --  3.7 4.4 4.6  CL 104 105   < >  --  101 101 98  CO2 21* 26   < >  --  25 25 27   GLUCOSE 142* 119*   < >  --  154* 110* 262*  BUN 24* 19   < >  --  28* 31* 38*  CREATININE 1.42* 1.34*   < >  --  1.58* 1.43* 1.52*  CALCIUM 8.7* 8.4*   < >  --  7.8* 8.1* 8.4*  PROT 9.2* 8.0  --  8.0  --   --   --   ALBUMIN 3.2* 2.8*  --  2.8*  --   --   --   AST 24 25  --  16  --   --   --   ALT 20 16  --  14  --   --   --   ALKPHOS 118 95  --  98  --   --   --   BILITOT 2.3* 1.6*  --  1.2  --   --   --   GFRNONAA 54* 58*   < >  --  47* 53* 50*  ANIONGAP 12 8   < >  --  8 10 7    < > = values in this interval not displayed.     Hematology Recent Labs  Lab 11/01/22 1217 11/02/22 0822 11/08/22 0448  WBC 11.9* 10.1 9.8  RBC 4.54 4.15* 3.44*  3.50*  HGB 10.0* 9.3* 7.8*  HCT 35.5* 32.6* 26.8*  MCV 78.2* 78.6* 77.9*  MCH 22.0* 22.4* 22.7*  MCHC 28.2* 28.5* 29.1*  RDW 22.1* 22.0* 22.4*  PLT 325 259 252    Cardiac Enzymes Recent Labs  Lab 10/21/22 0908 10/21/22 1155 11/02/22 0822 11/02/22 1019 11/02/22 1757  TROPONINIHS 10 10 16  18* 16    BNP Recent Labs  Lab 11/02/22 0822 11/03/22 0428 11/08/22 0448  BNP 1,424.0* 1,220.0* 1,205.0*     DDimerNo results for input(s): "DDIMER" in the last 168 hours.    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 -2L urine output since admission. Will switch p.o. to IV Lasix 80 mg twice daily and assess response. Need to start him on carvedilol upon discharge due to history of cocaine use. Not a candidate for SGLT2 inhibitors due to history of Fournier's gangrene. -Continue cardioprotective medications with aspirin 81 mg once daily, atorvastatin 40 mg nightly. Per review of the clinic note from 08/2021, he did not want to take any medications and stopped all GDMT.  However patient now willing to take GDMT. -Has slight discoloration of the left great toe and wound on the tip of the left great toe, imaging showed chronic left common iliac artery occlusion with reconstitution at the level of L CFA, foot pulses unable to palpate due to foot swelling and Doppler not helpful as well.  He has claudication symptoms and has no rest pain.  He will need outpatient vascular surgery referral.    Signed, Marjo Bicker, MD  11/08/2022, 9:39 AM

## 2022-11-09 DIAGNOSIS — N1831 Chronic kidney disease, stage 3a: Secondary | ICD-10-CM | POA: Diagnosis not present

## 2022-11-09 DIAGNOSIS — J9601 Acute respiratory failure with hypoxia: Secondary | ICD-10-CM

## 2022-11-09 DIAGNOSIS — I739 Peripheral vascular disease, unspecified: Secondary | ICD-10-CM | POA: Diagnosis not present

## 2022-11-09 DIAGNOSIS — N183 Chronic kidney disease, stage 3 unspecified: Secondary | ICD-10-CM | POA: Diagnosis not present

## 2022-11-09 DIAGNOSIS — Z91148 Patient's other noncompliance with medication regimen for other reason: Secondary | ICD-10-CM

## 2022-11-09 DIAGNOSIS — D649 Anemia, unspecified: Secondary | ICD-10-CM | POA: Diagnosis not present

## 2022-11-09 DIAGNOSIS — I5023 Acute on chronic systolic (congestive) heart failure: Secondary | ICD-10-CM | POA: Diagnosis not present

## 2022-11-09 LAB — BASIC METABOLIC PANEL
Anion gap: 8 (ref 5–15)
BUN: 43 mg/dL — ABNORMAL HIGH (ref 8–23)
CO2: 27 mmol/L (ref 22–32)
Calcium: 8.5 mg/dL — ABNORMAL LOW (ref 8.9–10.3)
Chloride: 98 mmol/L (ref 98–111)
Creatinine, Ser: 1.61 mg/dL — ABNORMAL HIGH (ref 0.61–1.24)
GFR, Estimated: 46 mL/min — ABNORMAL LOW (ref >60)
Glucose, Bld: 201 mg/dL — ABNORMAL HIGH (ref 70–99)
Potassium: 4.4 mmol/L (ref 3.5–5.1)
Sodium: 133 mmol/L — ABNORMAL LOW (ref 135–145)

## 2022-11-09 LAB — CBC
HCT: 25.4 % — ABNORMAL LOW (ref 39.0–52.0)
Hemoglobin: 7.4 g/dL — ABNORMAL LOW (ref 13.0–17.0)
MCH: 22.4 pg — ABNORMAL LOW (ref 26.0–34.0)
MCHC: 29.1 g/dL — ABNORMAL LOW (ref 30.0–36.0)
MCV: 76.7 fL — ABNORMAL LOW (ref 80.0–100.0)
Platelets: 277 10*3/uL (ref 150–400)
RBC: 3.31 MIL/uL — ABNORMAL LOW (ref 4.22–5.81)
RDW: 22.3 % — ABNORMAL HIGH (ref 11.5–15.5)
WBC: 12.8 10*3/uL — ABNORMAL HIGH (ref 4.0–10.5)
nRBC: 0 % (ref 0.0–0.2)

## 2022-11-09 LAB — PREPARE RBC (CROSSMATCH)

## 2022-11-09 LAB — GLUCOSE, CAPILLARY
Glucose-Capillary: 249 mg/dL — ABNORMAL HIGH (ref 70–99)
Glucose-Capillary: 291 mg/dL — ABNORMAL HIGH (ref 70–99)
Glucose-Capillary: 291 mg/dL — ABNORMAL HIGH (ref 70–99)
Glucose-Capillary: 338 mg/dL — ABNORMAL HIGH (ref 70–99)

## 2022-11-09 LAB — OCCULT BLOOD X 1 CARD TO LAB, STOOL: Fecal Occult Bld: POSITIVE — AB

## 2022-11-09 MED ORDER — PANTOPRAZOLE SODIUM 40 MG PO TBEC
40.0000 mg | DELAYED_RELEASE_TABLET | Freq: Two times a day (BID) | ORAL | Status: DC
  Administered 2022-11-09 – 2022-11-15 (×12): 40 mg via ORAL
  Filled 2022-11-09 (×12): qty 1

## 2022-11-09 MED ORDER — DEXAMETHASONE 4 MG PO TABS
4.0000 mg | ORAL_TABLET | Freq: Every day | ORAL | Status: DC
  Administered 2022-11-09 – 2022-11-12 (×4): 4 mg via ORAL
  Filled 2022-11-09 (×4): qty 1

## 2022-11-09 MED ORDER — SODIUM CHLORIDE 0.9% IV SOLUTION: Freq: Once | INTRAVENOUS | Status: AC

## 2022-11-09 NOTE — Hospital Course (Addendum)
67 y.o. male with a history of CAD, tobacco use, HTN, HLD, T2DM, GERD, history of cocaine use, OSA, and combined CHF (LVEF 20-25%, G2DD) who presented to the ED 10/23 with migratory abdominal/back/pelvic pain and dyspnea. He was admitted for recurrent acute on chronic combined CHF, [place] +cocaine. IV diuresis is underway with cardiology consultation. Developed delirium 10/25, head CT nonacute. Mentation has cleared up a bit but not at baseline. CBG shows no new hypercarbia. MR brain shows chronic meningioma with edema without midline shift.  Hospitalization has been prolonged secondary to fluid overload requiring aggressive diuresis as well as drop in hemoglobin.  Patient was transfused 2 unit PRBC total for the hospitalization .  GI was consulted assist with management.  colonoscopy--3 polyps in transverse colon, one in ascending colon;  iron deficiency due to large polyps with one with visible oozing. Slow response to diuresis in part due to patient's dietary indiscretion as he ordered outside food.

## 2022-11-09 NOTE — Progress Notes (Signed)
Blood transfusion paper was released. Type and screen had not been completed at the time. Lab came to draw blood. Type and screen is still processing. Night shift RN will be made aware.

## 2022-11-09 NOTE — Progress Notes (Signed)
Progress Note  Patient Name: [name] Date of Encounter: 11/09/2022  Primary Cardiologist: Reatha Harps, MD  Subjective   Continues to have DOE, urine output is not charted.  He reported having nausea last night.  He attributed these to changing medications.  Inpatient Medications    Scheduled Meds:  aspirin EC  81 mg Oral Daily   atorvastatin  40 mg Oral QHS   cephALEXin  500 mg Oral Q8H   enoxaparin (LOVENOX) injection  40 mg Subcutaneous Q24H   furosemide  80 mg Intravenous BID   gabapentin  300 mg Oral TID   insulin aspart  0-15 Units Subcutaneous TID WC   insulin aspart  0-5 Units Subcutaneous QHS   insulin aspart  5 Units Subcutaneous TID WC   lidocaine  1 patch Transdermal Q24H   nicotine  21 mg Transdermal Daily   pantoprazole  40 mg Oral Daily   polyethylene glycol  17 g Oral BID   sodium chloride flush  3 mL Intravenous Q12H   Continuous Infusions:  PRN Meds: acetaminophen **OR** acetaminophen, hydrOXYzine, methocarbamol, nitroGLYCERIN, senna   Vital Signs    Vitals:   11/08/22 1416 11/08/22 2103 11/09/22 0500 11/09/22 0912  BP: 122/81 114/74 119/84   Pulse: 84 85 85   Resp:  20 17   Temp: 98.1 F (36.7 C) 98.3 F (36.8 C) 97.6 F (36.4 C)   TempSrc:  Oral Oral   SpO2: 91% 97% 97% 96%  Weight:   105 kg    No intake or output data in the 24 hours ending 11/09/22 1018  Filed Weights   11/07/22 0540 11/08/22 0500 11/09/22 0500  Weight: 108.7 kg 107.8 kg 105 kg    ECG    Not performed today, not on telemetry.  Physical Exam   GEN: No acute distress.   Neck: JVD unable to examine due to body habitus Cardiac: RRR, no murmur, rub, or gallop.  Respiratory: Nonlabored. Clear to auscultation bilaterally. GI: Soft, nontender, bowel sounds present. MS: 1-2+ pitting edema in bilateral lower EXTR; No deformity. Neuro:  Nonfocal. Psych: Alert and oriented x 3. Normal affect.  Labs    Chemistry Recent Labs  Lab 11/05/22 1122  11/06/22 0402 11/07/22 0419 11/08/22 0448 11/09/22 0503  NA  --    < > 136 132* 133*  K  --    < > 4.4 4.6 4.4  CL  --    < > 101 98 98  CO2  --    < > 25 27 27   GLUCOSE  --    < > 110* 262* 201*  BUN  --    < > 31* 38* 43*  CREATININE  --    < > 1.43* 1.52* 1.61*  CALCIUM  --    < > 8.1* 8.4* 8.5*  PROT 8.0  --   --   --   --   ALBUMIN 2.8*  --   --   --   --   AST 16  --   --   --   --   ALT 14  --   --   --   --   ALKPHOS 98  --   --   --   --   BILITOT 1.2  --   --   --   --   GFRNONAA  --    < > 53* 50* 46*  ANIONGAP  --    < > 10 7 8    < > =  values in this interval not displayed.     Hematology Recent Labs  Lab 11/08/22 0448 11/09/22 0503  WBC 9.8 12.8*  RBC 3.44*  3.50* 3.31*  HGB 7.8* 7.4*  HCT 26.8* 25.4*  MCV 77.9* 76.7*  MCH 22.7* 22.4*  MCHC 29.1* 29.1*  RDW 22.4* 22.3*  PLT 252 277    Cardiac Enzymes Recent Labs  Lab 10/21/22 0908 10/21/22 1155 11/02/22 0822 11/02/22 1019 11/02/22 1757  TROPONINIHS 10 10 16  18* 16    BNP Recent Labs  Lab 11/03/22 0428 11/08/22 0448  BNP 1,220.0* 1,205.0*     DDimerNo results for input(s): "DDIMER" in the last 168 hours.    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 Will decrease the dose of IV Lasix from 80 mg to 40 mg twice daily, ReDs Vest is of poor quality. Hb dropped from 10 on admission to 7.4 now. Keep Hb more than 8  due to multivessel CAD on medical management.  Need to start him on carvedilol upon discharge due to history of cocaine use.  Not a candidate for SGL 2 inhibitors due to history for this gangrene.  Continue cardioprotective medications with aspirin to 1 mg once daily, dose statin 40 mg nightly. Per review the clinic note from 08/2021, he did not want to take any medications and stopped all GDMT. -No CLI, outpatient vessel surgery referral for management of moderate to severe LLE PAD.    Signed, Marjo Bicker, MD  11/09/2022, 10:18 AM

## 2022-11-09 NOTE — Progress Notes (Signed)
Patient has been informed several times that we need to monitor urine output and to use the urinal.

## 2022-11-09 NOTE — Progress Notes (Signed)
Patient was reminded again about urinating in the urinal so we can measure. Patient states he keeps forgetting. Patient was also informed that we are in need of a stool specimen.

## 2022-11-09 NOTE — Progress Notes (Signed)
PROGRESS NOTE  [name]:[id number] DOB: Oct 01, 1955 DOA: 11/02/2022 PCP: Benetta Spar, MD  Brief History:   67 y.o. male with a history of CAD, tobacco use, HTN, HLD, T2DM, GERD, history of cocaine use, OSA, and combined CHF (LVEF 20-25%, G2DD) who presented to the ED 10/23 with migratory abdominal/back/pelvic pain and dyspnea. He was admitted for recurrent acute on chronic combined CHF, [place] +cocaine. IV diuresis is underway with cardiology consultation. Developed delirium 10/25, head CT nonacute. Mentation has cleared up a bit but not at baseline. CBG shows no new hypercarbia. MR brain shows chronic meningioma with edema without midline shift.  Hospitalization has been prolonged secondary to fluid overload requiring aggressive diuresis as well as drop in hemoglobin.  Patient was transfused 1 unit PRBC.  GI was consulted assist with management.   Assessment/Plan: Acute hypoxic respiratory failure due to acute on chronic HFrEF, pleural effusions:  - initially on 2L - Diurese as below, wean oxygen as tolerated, check ambulatory pulse oximetry today as exam is improved with return to IV lasix. - Continue to stress incentive spirometry, ambulate the patient as atelectasis is contributing. -now stable on RA   Acute metabolic encephalopathy, delirium:  -Possibly element of hospital delirium and/or cocaine withdrawal.  - CT head >> no acute findings, though chronic microvascular disease and meningioma decrease threshold for confusion.  - LFT's/ammonia wnl. VBG without acute hypercarbia. - No urinary retention noted per RN - 10/27 MR brain--slight interval growth of meningioma with progressive edema of R-temporal lobe - Keep delirium precautions and minimize sedating medications if able.  - Mentation improving after 2 days of dexamethasone  CKD 3a -baseline creatinine 1.4-1.7 -monitor with diuresis   Acute on chronic combined HFrEF and RV failure:  - Unclear  whether he was taking cardiac medications PTA, though he reports he was. Given severity of biventricular failure, cardiology consulted for advice. We will optimize volume status then discharge.  - Monitor I/O (actually turned a positive balance when PM lasix dose held), ReDS vest >40% on 10/28  - remains clinically fluid overloaded>>continue IV lasix - Given cocaine positivity, stopped beta blocker initially -10/21/22 Echo--EF 20-25%, G2DD, mild MR    Meningioma: Extra-axial brain mass known since 2023. Had mediastinal lymphadenopathy and pleural effusion sampled during that admission which did not show malignancy. Plan per neurosurgery was to biopsy brain mass if those were negative.  - Given persistent encephalopathy and cerebral edema suggested by CT, we checked MRI w/ and w/o contrast which shows increased size in meningioma with chronic cerebral edema. I've spoken with Dr. Maurice Small who initially saw this patient. He reviewed updated images and history. Recommendation is for outpatient follow up, though with small size and appearance so characteristic of meningioma, no intervention is anticipated. Given the confusion and cerebral edema, he was ok with short trial of decadron which we have pursued.    Cocaine abuse: +UDS - Cessation counseling   Acute on Chronic microcytic anemia:  -Hgb 10>>7.4 though no gross bleeding is noted. BUN up, though likely due to steroids and pt is on PPI.   - iron saturation 8%, ferritin 13 -foalte 7.4, B12 576 - Increase PPI to BID, check FOBT. Only record of GI evaluation was colonoscopy 2012 showing 2 sigmoid polyps, external hemorrhoids.  - Ok to continue lovenox VTE ppx for now unless bleeding is noted. -GI consult -10/30--transfuse one unit PRBC   Scrotal pain, history of Fournier's gangrene: U/S reassuring (  bile duct is normal. Pancreas: Pancreas is normal. No ductal dilatation. No pancreatic inflammation. Spleen: Normal spleen Adrenals/urinary tract: Adrenal glands normal. No nephrolithiasis or ureterolithiasis. Perinephric stranding similar comparison CT. No renal obstruction on delayed imaging. Bladder normal. Stomach/Bowel: Stomach, small-bowel and cecum are normal. The  appendix is not identified but there is no pericecal inflammation to suggest appendicitis. Insert Vascular/Lymphatic: Abdominal aorta is normal caliber with atherosclerotic calcification. There is no retroperitoneal or periportal lymphadenopathy. No pelvic lymphadenopathy. Reproductive: Prostate normal Other: small volume of free fluid along the LEFT and RIGHT pericolic gutter. Musculoskeletal: No aggressive osseous lesion. IMPRESSION: 1. No nephrolithiasis or ureterolithiasis. 2. Perinephric stranding similar to comparison CT. No renal obstruction on delayed imaging. 3. Moderate pleural effusion. 4. Small volume of free fluid along the LEFT and RIGHT pericolic gutter. 5. Cholelithiasis without evidence of cholecystitis. 6.  Aortic Atherosclerosis (ICD10-I70.0). Electronically Signed   By: Genevive Bi M.D.   On: 11/01/2022 16:45   US SCROTUM W/DOPPLER  Result Date: 11/01/2022 CLINICAL DATA:  Testicular pain EXAM: SCROTAL ULTRASOUND DOPPLER ULTRASOUND OF THE TESTICLES TECHNIQUE: Complete ultrasound examination of the testicles, epididymis, and other scrotal structures was performed. Color and spectral Doppler ultrasound were also utilized to evaluate blood flow to the testicles. COMPARISON:  None Available. FINDINGS: Right testicle Measurements: 3.3 x 1.6 x 2.8 cm. No mass or microlithiasis visualized. Left testicle Measurements: 3.4 x 2.2 x 2.2 cm. No mass or microlithiasis visualized. Right epididymis:  Normal in size and appearance. Left epididymis: Epididymal head cyst with internal echogenic debris measuring 1.1 x 1.0 x 1.0 cm. Hydrocele:  Small left hydrocele. Varicocele:  None visualized. Pulsed Doppler interrogation of both testes demonstrates normal low resistance arterial and venous waveforms bilaterally. IMPRESSION: 1. Complex left epididymal head cyst measuring up to 1.1 cm. 2. Small left hydrocele. Electronically Signed   By: Allegra Lai M.D.   On: 11/01/2022 11:46   ECHOCARDIOGRAM  COMPLETE  Result Date: 10/21/2022    ECHOCARDIOGRAM REPORT   Patient Name:   [name] Date of Exam: 10/21/2022 Medical Rec #:  [id number]        Height:       69.0 in Accession #:    [id number]       Weight:       260.0 lb Date of Birth:  [birth year]        BSA:          2.310 m Patient Age:    67 years         BP:           104/78 mmHg Patient Gender: M                HR:           74 bpm. Exam Location:  [facility] Procedure: 2D Echo, Cardiac Doppler and Color Doppler Indications:    CHF- Acute Systolic I50.21  History:        Patient has prior history of Echocardiogram examinations, most                 recent 11/07/2021. CHF and Cardiomyopathy, Angina and Previous                 Myocardial Infarction, Stroke and PAD, Arrythmias:Tachycardia,                 Signs/Symptoms:Chest Pain; Risk Factors:Hypertension, Sleep                 Apnea, Diabetes  PROGRESS NOTE  [name]:[id number] DOB: Oct 01, 1955 DOA: 11/02/2022 PCP: Benetta Spar, MD  Brief History:   67 y.o. male with a history of CAD, tobacco use, HTN, HLD, T2DM, GERD, history of cocaine use, OSA, and combined CHF (LVEF 20-25%, G2DD) who presented to the ED 10/23 with migratory abdominal/back/pelvic pain and dyspnea. He was admitted for recurrent acute on chronic combined CHF, [place] +cocaine. IV diuresis is underway with cardiology consultation. Developed delirium 10/25, head CT nonacute. Mentation has cleared up a bit but not at baseline. CBG shows no new hypercarbia. MR brain shows chronic meningioma with edema without midline shift.  Hospitalization has been prolonged secondary to fluid overload requiring aggressive diuresis as well as drop in hemoglobin.  Patient was transfused 1 unit PRBC.  GI was consulted assist with management.   Assessment/Plan: Acute hypoxic respiratory failure due to acute on chronic HFrEF, pleural effusions:  - initially on 2L - Diurese as below, wean oxygen as tolerated, check ambulatory pulse oximetry today as exam is improved with return to IV lasix. - Continue to stress incentive spirometry, ambulate the patient as atelectasis is contributing. -now stable on RA   Acute metabolic encephalopathy, delirium:  -Possibly element of hospital delirium and/or cocaine withdrawal.  - CT head >> no acute findings, though chronic microvascular disease and meningioma decrease threshold for confusion.  - LFT's/ammonia wnl. VBG without acute hypercarbia. - No urinary retention noted per RN - 10/27 MR brain--slight interval growth of meningioma with progressive edema of R-temporal lobe - Keep delirium precautions and minimize sedating medications if able.  - Mentation improving after 2 days of dexamethasone  CKD 3a -baseline creatinine 1.4-1.7 -monitor with diuresis   Acute on chronic combined HFrEF and RV failure:  - Unclear  whether he was taking cardiac medications PTA, though he reports he was. Given severity of biventricular failure, cardiology consulted for advice. We will optimize volume status then discharge.  - Monitor I/O (actually turned a positive balance when PM lasix dose held), ReDS vest >40% on 10/28  - remains clinically fluid overloaded>>continue IV lasix - Given cocaine positivity, stopped beta blocker initially -10/21/22 Echo--EF 20-25%, G2DD, mild MR    Meningioma: Extra-axial brain mass known since 2023. Had mediastinal lymphadenopathy and pleural effusion sampled during that admission which did not show malignancy. Plan per neurosurgery was to biopsy brain mass if those were negative.  - Given persistent encephalopathy and cerebral edema suggested by CT, we checked MRI w/ and w/o contrast which shows increased size in meningioma with chronic cerebral edema. I've spoken with Dr. Maurice Small who initially saw this patient. He reviewed updated images and history. Recommendation is for outpatient follow up, though with small size and appearance so characteristic of meningioma, no intervention is anticipated. Given the confusion and cerebral edema, he was ok with short trial of decadron which we have pursued.    Cocaine abuse: +UDS - Cessation counseling   Acute on Chronic microcytic anemia:  -Hgb 10>>7.4 though no gross bleeding is noted. BUN up, though likely due to steroids and pt is on PPI.   - iron saturation 8%, ferritin 13 -foalte 7.4, B12 576 - Increase PPI to BID, check FOBT. Only record of GI evaluation was colonoscopy 2012 showing 2 sigmoid polyps, external hemorrhoids.  - Ok to continue lovenox VTE ppx for now unless bleeding is noted. -GI consult -10/30--transfuse one unit PRBC   Scrotal pain, history of Fournier's gangrene: U/S reassuring (  bile duct is normal. Pancreas: Pancreas is normal. No ductal dilatation. No pancreatic inflammation. Spleen: Normal spleen Adrenals/urinary tract: Adrenal glands normal. No nephrolithiasis or ureterolithiasis. Perinephric stranding similar comparison CT. No renal obstruction on delayed imaging. Bladder normal. Stomach/Bowel: Stomach, small-bowel and cecum are normal. The  appendix is not identified but there is no pericecal inflammation to suggest appendicitis. Insert Vascular/Lymphatic: Abdominal aorta is normal caliber with atherosclerotic calcification. There is no retroperitoneal or periportal lymphadenopathy. No pelvic lymphadenopathy. Reproductive: Prostate normal Other: small volume of free fluid along the LEFT and RIGHT pericolic gutter. Musculoskeletal: No aggressive osseous lesion. IMPRESSION: 1. No nephrolithiasis or ureterolithiasis. 2. Perinephric stranding similar to comparison CT. No renal obstruction on delayed imaging. 3. Moderate pleural effusion. 4. Small volume of free fluid along the LEFT and RIGHT pericolic gutter. 5. Cholelithiasis without evidence of cholecystitis. 6.  Aortic Atherosclerosis (ICD10-I70.0). Electronically Signed   By: Genevive Bi M.D.   On: 11/01/2022 16:45   US SCROTUM W/DOPPLER  Result Date: 11/01/2022 CLINICAL DATA:  Testicular pain EXAM: SCROTAL ULTRASOUND DOPPLER ULTRASOUND OF THE TESTICLES TECHNIQUE: Complete ultrasound examination of the testicles, epididymis, and other scrotal structures was performed. Color and spectral Doppler ultrasound were also utilized to evaluate blood flow to the testicles. COMPARISON:  None Available. FINDINGS: Right testicle Measurements: 3.3 x 1.6 x 2.8 cm. No mass or microlithiasis visualized. Left testicle Measurements: 3.4 x 2.2 x 2.2 cm. No mass or microlithiasis visualized. Right epididymis:  Normal in size and appearance. Left epididymis: Epididymal head cyst with internal echogenic debris measuring 1.1 x 1.0 x 1.0 cm. Hydrocele:  Small left hydrocele. Varicocele:  None visualized. Pulsed Doppler interrogation of both testes demonstrates normal low resistance arterial and venous waveforms bilaterally. IMPRESSION: 1. Complex left epididymal head cyst measuring up to 1.1 cm. 2. Small left hydrocele. Electronically Signed   By: Allegra Lai M.D.   On: 11/01/2022 11:46   ECHOCARDIOGRAM  COMPLETE  Result Date: 10/21/2022    ECHOCARDIOGRAM REPORT   Patient Name:   [name] Date of Exam: 10/21/2022 Medical Rec #:  [id number]        Height:       69.0 in Accession #:    [id number]       Weight:       260.0 lb Date of Birth:  [birth year]        BSA:          2.310 m Patient Age:    67 years         BP:           104/78 mmHg Patient Gender: M                HR:           74 bpm. Exam Location:  [facility] Procedure: 2D Echo, Cardiac Doppler and Color Doppler Indications:    CHF- Acute Systolic I50.21  History:        Patient has prior history of Echocardiogram examinations, most                 recent 11/07/2021. CHF and Cardiomyopathy, Angina and Previous                 Myocardial Infarction, Stroke and PAD, Arrythmias:Tachycardia,                 Signs/Symptoms:Chest Pain; Risk Factors:Hypertension, Sleep                 Apnea, Diabetes  bile duct is normal. Pancreas: Pancreas is normal. No ductal dilatation. No pancreatic inflammation. Spleen: Normal spleen Adrenals/urinary tract: Adrenal glands normal. No nephrolithiasis or ureterolithiasis. Perinephric stranding similar comparison CT. No renal obstruction on delayed imaging. Bladder normal. Stomach/Bowel: Stomach, small-bowel and cecum are normal. The  appendix is not identified but there is no pericecal inflammation to suggest appendicitis. Insert Vascular/Lymphatic: Abdominal aorta is normal caliber with atherosclerotic calcification. There is no retroperitoneal or periportal lymphadenopathy. No pelvic lymphadenopathy. Reproductive: Prostate normal Other: small volume of free fluid along the LEFT and RIGHT pericolic gutter. Musculoskeletal: No aggressive osseous lesion. IMPRESSION: 1. No nephrolithiasis or ureterolithiasis. 2. Perinephric stranding similar to comparison CT. No renal obstruction on delayed imaging. 3. Moderate pleural effusion. 4. Small volume of free fluid along the LEFT and RIGHT pericolic gutter. 5. Cholelithiasis without evidence of cholecystitis. 6.  Aortic Atherosclerosis (ICD10-I70.0). Electronically Signed   By: Genevive Bi M.D.   On: 11/01/2022 16:45   US SCROTUM W/DOPPLER  Result Date: 11/01/2022 CLINICAL DATA:  Testicular pain EXAM: SCROTAL ULTRASOUND DOPPLER ULTRASOUND OF THE TESTICLES TECHNIQUE: Complete ultrasound examination of the testicles, epididymis, and other scrotal structures was performed. Color and spectral Doppler ultrasound were also utilized to evaluate blood flow to the testicles. COMPARISON:  None Available. FINDINGS: Right testicle Measurements: 3.3 x 1.6 x 2.8 cm. No mass or microlithiasis visualized. Left testicle Measurements: 3.4 x 2.2 x 2.2 cm. No mass or microlithiasis visualized. Right epididymis:  Normal in size and appearance. Left epididymis: Epididymal head cyst with internal echogenic debris measuring 1.1 x 1.0 x 1.0 cm. Hydrocele:  Small left hydrocele. Varicocele:  None visualized. Pulsed Doppler interrogation of both testes demonstrates normal low resistance arterial and venous waveforms bilaterally. IMPRESSION: 1. Complex left epididymal head cyst measuring up to 1.1 cm. 2. Small left hydrocele. Electronically Signed   By: Allegra Lai M.D.   On: 11/01/2022 11:46   ECHOCARDIOGRAM  COMPLETE  Result Date: 10/21/2022    ECHOCARDIOGRAM REPORT   Patient Name:   [name] Date of Exam: 10/21/2022 Medical Rec #:  [id number]        Height:       69.0 in Accession #:    [id number]       Weight:       260.0 lb Date of Birth:  [birth year]        BSA:          2.310 m Patient Age:    67 years         BP:           104/78 mmHg Patient Gender: M                HR:           74 bpm. Exam Location:  [facility] Procedure: 2D Echo, Cardiac Doppler and Color Doppler Indications:    CHF- Acute Systolic I50.21  History:        Patient has prior history of Echocardiogram examinations, most                 recent 11/07/2021. CHF and Cardiomyopathy, Angina and Previous                 Myocardial Infarction, Stroke and PAD, Arrythmias:Tachycardia,                 Signs/Symptoms:Chest Pain; Risk Factors:Hypertension, Sleep                 Apnea, Diabetes  PROGRESS NOTE  [name]:[id number] DOB: Oct 01, 1955 DOA: 11/02/2022 PCP: Benetta Spar, MD  Brief History:   67 y.o. male with a history of CAD, tobacco use, HTN, HLD, T2DM, GERD, history of cocaine use, OSA, and combined CHF (LVEF 20-25%, G2DD) who presented to the ED 10/23 with migratory abdominal/back/pelvic pain and dyspnea. He was admitted for recurrent acute on chronic combined CHF, [place] +cocaine. IV diuresis is underway with cardiology consultation. Developed delirium 10/25, head CT nonacute. Mentation has cleared up a bit but not at baseline. CBG shows no new hypercarbia. MR brain shows chronic meningioma with edema without midline shift.  Hospitalization has been prolonged secondary to fluid overload requiring aggressive diuresis as well as drop in hemoglobin.  Patient was transfused 1 unit PRBC.  GI was consulted assist with management.   Assessment/Plan: Acute hypoxic respiratory failure due to acute on chronic HFrEF, pleural effusions:  - initially on 2L - Diurese as below, wean oxygen as tolerated, check ambulatory pulse oximetry today as exam is improved with return to IV lasix. - Continue to stress incentive spirometry, ambulate the patient as atelectasis is contributing. -now stable on RA   Acute metabolic encephalopathy, delirium:  -Possibly element of hospital delirium and/or cocaine withdrawal.  - CT head >> no acute findings, though chronic microvascular disease and meningioma decrease threshold for confusion.  - LFT's/ammonia wnl. VBG without acute hypercarbia. - No urinary retention noted per RN - 10/27 MR brain--slight interval growth of meningioma with progressive edema of R-temporal lobe - Keep delirium precautions and minimize sedating medications if able.  - Mentation improving after 2 days of dexamethasone  CKD 3a -baseline creatinine 1.4-1.7 -monitor with diuresis   Acute on chronic combined HFrEF and RV failure:  - Unclear  whether he was taking cardiac medications PTA, though he reports he was. Given severity of biventricular failure, cardiology consulted for advice. We will optimize volume status then discharge.  - Monitor I/O (actually turned a positive balance when PM lasix dose held), ReDS vest >40% on 10/28  - remains clinically fluid overloaded>>continue IV lasix - Given cocaine positivity, stopped beta blocker initially -10/21/22 Echo--EF 20-25%, G2DD, mild MR    Meningioma: Extra-axial brain mass known since 2023. Had mediastinal lymphadenopathy and pleural effusion sampled during that admission which did not show malignancy. Plan per neurosurgery was to biopsy brain mass if those were negative.  - Given persistent encephalopathy and cerebral edema suggested by CT, we checked MRI w/ and w/o contrast which shows increased size in meningioma with chronic cerebral edema. I've spoken with Dr. Maurice Small who initially saw this patient. He reviewed updated images and history. Recommendation is for outpatient follow up, though with small size and appearance so characteristic of meningioma, no intervention is anticipated. Given the confusion and cerebral edema, he was ok with short trial of decadron which we have pursued.    Cocaine abuse: +UDS - Cessation counseling   Acute on Chronic microcytic anemia:  -Hgb 10>>7.4 though no gross bleeding is noted. BUN up, though likely due to steroids and pt is on PPI.   - iron saturation 8%, ferritin 13 -foalte 7.4, B12 576 - Increase PPI to BID, check FOBT. Only record of GI evaluation was colonoscopy 2012 showing 2 sigmoid polyps, external hemorrhoids.  - Ok to continue lovenox VTE ppx for now unless bleeding is noted. -GI consult -10/30--transfuse one unit PRBC   Scrotal pain, history of Fournier's gangrene: U/S reassuring (  bile duct is normal. Pancreas: Pancreas is normal. No ductal dilatation. No pancreatic inflammation. Spleen: Normal spleen Adrenals/urinary tract: Adrenal glands normal. No nephrolithiasis or ureterolithiasis. Perinephric stranding similar comparison CT. No renal obstruction on delayed imaging. Bladder normal. Stomach/Bowel: Stomach, small-bowel and cecum are normal. The  appendix is not identified but there is no pericecal inflammation to suggest appendicitis. Insert Vascular/Lymphatic: Abdominal aorta is normal caliber with atherosclerotic calcification. There is no retroperitoneal or periportal lymphadenopathy. No pelvic lymphadenopathy. Reproductive: Prostate normal Other: small volume of free fluid along the LEFT and RIGHT pericolic gutter. Musculoskeletal: No aggressive osseous lesion. IMPRESSION: 1. No nephrolithiasis or ureterolithiasis. 2. Perinephric stranding similar to comparison CT. No renal obstruction on delayed imaging. 3. Moderate pleural effusion. 4. Small volume of free fluid along the LEFT and RIGHT pericolic gutter. 5. Cholelithiasis without evidence of cholecystitis. 6.  Aortic Atherosclerosis (ICD10-I70.0). Electronically Signed   By: Genevive Bi M.D.   On: 11/01/2022 16:45   US SCROTUM W/DOPPLER  Result Date: 11/01/2022 CLINICAL DATA:  Testicular pain EXAM: SCROTAL ULTRASOUND DOPPLER ULTRASOUND OF THE TESTICLES TECHNIQUE: Complete ultrasound examination of the testicles, epididymis, and other scrotal structures was performed. Color and spectral Doppler ultrasound were also utilized to evaluate blood flow to the testicles. COMPARISON:  None Available. FINDINGS: Right testicle Measurements: 3.3 x 1.6 x 2.8 cm. No mass or microlithiasis visualized. Left testicle Measurements: 3.4 x 2.2 x 2.2 cm. No mass or microlithiasis visualized. Right epididymis:  Normal in size and appearance. Left epididymis: Epididymal head cyst with internal echogenic debris measuring 1.1 x 1.0 x 1.0 cm. Hydrocele:  Small left hydrocele. Varicocele:  None visualized. Pulsed Doppler interrogation of both testes demonstrates normal low resistance arterial and venous waveforms bilaterally. IMPRESSION: 1. Complex left epididymal head cyst measuring up to 1.1 cm. 2. Small left hydrocele. Electronically Signed   By: Allegra Lai M.D.   On: 11/01/2022 11:46   ECHOCARDIOGRAM  COMPLETE  Result Date: 10/21/2022    ECHOCARDIOGRAM REPORT   Patient Name:   [name] Date of Exam: 10/21/2022 Medical Rec #:  [id number]        Height:       69.0 in Accession #:    [id number]       Weight:       260.0 lb Date of Birth:  [birth year]        BSA:          2.310 m Patient Age:    67 years         BP:           104/78 mmHg Patient Gender: M                HR:           74 bpm. [facility]am Location[facility] Procedure: 2D Echo, Cardiac Doppler and Color Doppler Indications:    CHF- Acute Systolic I50.21  History:        Patient has prior history of Echocardiogram examinations, most                 recent 11/07/2021. CHF and Cardiomyopathy, Angina and Previous                 Myocardial Infarction, Stroke and PAD, Arrythmias:Tachycardia,                 Signs/Symptoms:Chest Pain; Risk Factors:Hypertension, Sleep                 Apnea, Diabetes  bile duct is normal. Pancreas: Pancreas is normal. No ductal dilatation. No pancreatic inflammation. Spleen: Normal spleen Adrenals/urinary tract: Adrenal glands normal. No nephrolithiasis or ureterolithiasis. Perinephric stranding similar comparison CT. No renal obstruction on delayed imaging. Bladder normal. Stomach/Bowel: Stomach, small-bowel and cecum are normal. The  appendix is not identified but there is no pericecal inflammation to suggest appendicitis. Insert Vascular/Lymphatic: Abdominal aorta is normal caliber with atherosclerotic calcification. There is no retroperitoneal or periportal lymphadenopathy. No pelvic lymphadenopathy. Reproductive: Prostate normal Other: small volume of free fluid along the LEFT and RIGHT pericolic gutter. Musculoskeletal: No aggressive osseous lesion. IMPRESSION: 1. No nephrolithiasis or ureterolithiasis. 2. Perinephric stranding similar to comparison CT. No renal obstruction on delayed imaging. 3. Moderate pleural effusion. 4. Small volume of free fluid along the LEFT and RIGHT pericolic gutter. 5. Cholelithiasis without evidence of cholecystitis. 6.  Aortic Atherosclerosis (ICD10-I70.0). Electronically Signed   By: Genevive Bi M.D.   On: 11/01/2022 16:45   US SCROTUM W/DOPPLER  Result Date: 11/01/2022 CLINICAL DATA:  Testicular pain EXAM: SCROTAL ULTRASOUND DOPPLER ULTRASOUND OF THE TESTICLES TECHNIQUE: Complete ultrasound examination of the testicles, epididymis, and other scrotal structures was performed. Color and spectral Doppler ultrasound were also utilized to evaluate blood flow to the testicles. COMPARISON:  None Available. FINDINGS: Right testicle Measurements: 3.3 x 1.6 x 2.8 cm. No mass or microlithiasis visualized. Left testicle Measurements: 3.4 x 2.2 x 2.2 cm. No mass or microlithiasis visualized. Right epididymis:  Normal in size and appearance. Left epididymis: Epididymal head cyst with internal echogenic debris measuring 1.1 x 1.0 x 1.0 cm. Hydrocele:  Small left hydrocele. Varicocele:  None visualized. Pulsed Doppler interrogation of both testes demonstrates normal low resistance arterial and venous waveforms bilaterally. IMPRESSION: 1. Complex left epididymal head cyst measuring up to 1.1 cm. 2. Small left hydrocele. Electronically Signed   By: Allegra Lai M.D.   On: 11/01/2022 11:46   ECHOCARDIOGRAM  COMPLETE  Result Date: 10/21/2022    ECHOCARDIOGRAM REPORT   Patient Name:   [name] Date of Exam: 10/21/2022 Medical Rec #:  [id number]        Height:       69.0 in Accession #:    [id number]       Weight:       260.0 lb Date of Birth:  [birth year]        BSA:          2.310 m Patient Age:    67 years         BP:           104/78 mmHg Patient Gender: M                HR:           74 bpm. Ex[facility] Procedure: 2D Echo, Cardiac Doppler and Color Doppler Indications:    CHF- Acute Systolic I50.21  History:        Patient has prior history of Echocardiogram examinations, most                 recent 11/07/2021. CHF and Cardiomyopathy, Angina and Previous                 Myocardial Infarction, Stroke and PAD, Arrythmias:Tachycardia,                 Signs/Symptoms:Chest Pain; Risk Factors:Hypertension, Sleep                 Apnea, Diabetes

## 2022-11-09 NOTE — Evaluation (Signed)
Physical Therapy Evaluation Patient Details Name: [name] MRN: [id number] DOB: 1955/12/21 Today's Date: 11/09/2022  History of Present Illness  Per [facility]:[id number] [name] is a 67 y.o. 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 He had been discharged 10/15 after an admission for acute CHF and noted gradual increasing pain in the scrotum, swelling of the scrotum, and leg swelling associated with dyspnea that worsened with ambulation. No fevers, chills. He had presented to the ED 10/22 where work up was reassuring. Scrotal ultrasound showed complex left epididymal head cyst as well as a small left hydrocele. Urine microscopy negative. CT showed perinephric stranding similar to prior CT scans, moderate pleural effusion, and small volume free fluid along left and right paracolic gutter. His lab work from yesterday was notable for a very slight leukocytosis. He was discharged but returned when symptoms continued today, also with some mild nausea. He was afebrile and hypertensive, hypoxic requiring 4L O2. CTA C/A/P was performed and ruled out dissection. There was R > L pleural effusions with pulmonary edema, and chronic left iliac artery occlusion with distal reconstitution. Leukocytosis has resolved, BNP elevated, lactic acid elevated. For concern of acute CHF, IV lasix was given and admission requested.  Clinical Impression  PT normally uses 4 wheeled walker at home.  PT unsteady without assistive device but able to ambulate with mod I with rolling walker.  Able to get in and out of bed with mod I.  Pt was receiving HH services prior to hospitalization.  Pt may benefit from at least and eval for his steps in and out of his home but appears to be very close to prior level of function at this point.  Will have nursing and mobility tech  continue ambulation.         If plan is discharge home, recommend the following: Assist for transportation;Help with stairs or ramp for entrance   Can travel by private vehicle    Yes     Equipment Recommendations None recommended by PT  Recommendations for Other Services       Functional Status Assessment Patient has had a recent decline in their functional status and demonstrates the ability to make significant improvements in function in a reasonable and predictable amount of time.     Precautions / Restrictions Precautions Precautions: None Restrictions Weight Bearing Restrictions: No      Mobility  Bed Mobility Overal bed mobility: Independent                  Transfers Overall transfer level: Independent                      Ambulation/Gait Ambulation/Gait assistance: Modified independent (Device/Increase time) Gait Distance (Feet): 200 Feet Assistive device: Rolling walker (2 wheels) Gait Pattern/deviations: Decreased step length - right, Decreased step length - left              Pertinent Vitals/Pain Pain Assessment Pain Assessment: No/denies pain    Home Living Family/patient expects to be discharged to:: Private residence Living Arrangements: Spouse/significant other;Children Available Help at Discharge: Family;Friend(s);Available PRN/intermittently;Personal care attendant Type of Home: Mobile home Home Access: Stairs to enter Entrance Stairs-Rails: Right;Left;Can reach both Entrance Stairs-Number of Steps: 2   Home Layout: One level Home Equipment: Rollator (4 wheels);Shower seat;Toilet riser      Prior  Function Prior Level of Function : Independent/Modified Independent             Mobility Comments: Pt reports having a rollator at home for mobility ADLs Comments: Doesn't drive and says he has help with shopping. independent with ADLs     Extremity/Trunk Assessment        Lower Extremity Assessment Lower  Extremity Assessment: Overall WFL for tasks assessed       Communication   Communication Communication: No apparent difficulties  Cognition Arousal: Alert Behavior During Therapy: WFL for tasks assessed/performed Overall Cognitive Status: Within Functional Limits for tasks assessed                                          General Comments          Assessment/Plan    PT Assessment All further PT needs can be met in the next venue of care  PT Problem List Decreased activity tolerance       PT Treatment Interventions Balance training    PT Goals (Current goals can be found in the Care Plan section)  Acute Rehab PT Goals Patient Stated Goal: go home PT Goal Formulation: With patient Time For Goal Achievement: 11/06/22 Potential to Achieve Goals: Good            AM-PAC PT "6 Clicks" Mobility  Outcome Measure Help needed turning from your back to your side while in a flat bed without using bedrails?: None Help needed moving from lying on your back to sitting on the side of a flat bed without using bedrails?: None Help needed moving to and from a bed to a chair (including a wheelchair)?: None Help needed standing up from a chair using your arms (e.g., wheelchair or bedside chair)?: None Help needed to walk in hospital room?: None Help needed climbing 3-5 steps with a railing? : A Little 6 Click Score: 23    End of Session Equipment Utilized During Treatment: Gait belt Activity Tolerance: No increased pain Patient left: with call bell/phone within reach (sitting on edge of bed) Nurse Communication: Mobility status PT Visit Diagnosis: Other abnormalities of gait and mobility (R26.89)    Time: 1040-1110 PT Time Calculation (min) (ACUTE ONLY): 30 min   Charges:   PT Evaluation $PT Eval Low Complexity: 1 Low PT Treatments $Gait Training: 8-22 mins PT General Charges $$ ACUTE PT VISIT: 1 Visit          Virgina Organ, PT CLT [phone]   11/09/2022, 11:20 AM

## 2022-11-10 DIAGNOSIS — R195 Other fecal abnormalities: Secondary | ICD-10-CM | POA: Diagnosis not present

## 2022-11-10 DIAGNOSIS — K625 Hemorrhage of anus and rectum: Secondary | ICD-10-CM | POA: Diagnosis not present

## 2022-11-10 DIAGNOSIS — F17200 Nicotine dependence, unspecified, uncomplicated: Secondary | ICD-10-CM

## 2022-11-10 DIAGNOSIS — Z91148 Patient's other noncompliance with medication regimen for other reason: Secondary | ICD-10-CM

## 2022-11-10 DIAGNOSIS — D509 Iron deficiency anemia, unspecified: Secondary | ICD-10-CM | POA: Diagnosis not present

## 2022-11-10 DIAGNOSIS — K5900 Constipation, unspecified: Secondary | ICD-10-CM | POA: Diagnosis not present

## 2022-11-10 DIAGNOSIS — N183 Chronic kidney disease, stage 3 unspecified: Secondary | ICD-10-CM | POA: Diagnosis not present

## 2022-11-10 DIAGNOSIS — I739 Peripheral vascular disease, unspecified: Secondary | ICD-10-CM | POA: Diagnosis not present

## 2022-11-10 DIAGNOSIS — J9601 Acute respiratory failure with hypoxia: Secondary | ICD-10-CM | POA: Diagnosis not present

## 2022-11-10 DIAGNOSIS — N1831 Chronic kidney disease, stage 3a: Secondary | ICD-10-CM | POA: Diagnosis not present

## 2022-11-10 DIAGNOSIS — F1490 Cocaine use, unspecified, uncomplicated: Secondary | ICD-10-CM

## 2022-11-10 DIAGNOSIS — D649 Anemia, unspecified: Secondary | ICD-10-CM | POA: Diagnosis not present

## 2022-11-10 DIAGNOSIS — I5023 Acute on chronic systolic (congestive) heart failure: Secondary | ICD-10-CM | POA: Diagnosis not present

## 2022-11-10 LAB — URINALYSIS, W/ REFLEX TO CULTURE (INFECTION SUSPECTED)
Bilirubin Urine: NEGATIVE
Glucose, UA: NEGATIVE mg/dL
Hgb urine dipstick: NEGATIVE
Ketones, ur: NEGATIVE mg/dL
Leukocytes,Ua: NEGATIVE
Nitrite: NEGATIVE
Protein, ur: NEGATIVE mg/dL
Specific Gravity, Urine: 1.006 (ref 1.005–1.030)
pH: 5 (ref 5.0–8.0)

## 2022-11-10 LAB — TYPE AND SCREEN
ABO/RH(D): A POS
Antibody Screen: NEGATIVE
Unit division: 0

## 2022-11-10 LAB — GLUCOSE, CAPILLARY
Glucose-Capillary: 220 mg/dL — ABNORMAL HIGH (ref 70–99)
Glucose-Capillary: 355 mg/dL — ABNORMAL HIGH (ref 70–99)
Glucose-Capillary: 270 mg/dL — ABNORMAL HIGH (ref 70–99)
Glucose-Capillary: 134 mg/dL — ABNORMAL HIGH (ref 70–99)

## 2022-11-10 LAB — CBC
HCT: 30.4 % — ABNORMAL LOW (ref 39.0–52.0)
Hemoglobin: 9 g/dL — ABNORMAL LOW (ref 13.0–17.0)
MCH: 23 pg — ABNORMAL LOW (ref 26.0–34.0)
MCHC: 29.6 g/dL — ABNORMAL LOW (ref 30.0–36.0)
MCV: 77.6 fL — ABNORMAL LOW (ref 80.0–100.0)
Platelets: 304 10*3/uL (ref 150–400)
RBC: 3.92 MIL/uL — ABNORMAL LOW (ref 4.22–5.81)
RDW: 21.4 % — ABNORMAL HIGH (ref 11.5–15.5)
WBC: 16 10*3/uL — ABNORMAL HIGH (ref 4.0–10.5)
nRBC: 0.2 % (ref 0.0–0.2)

## 2022-11-10 LAB — BPAM RBC
Blood Product Expiration Date: 202411202359
ISSUE DATE / TIME: 202410302038
Unit Type and Rh: 6200

## 2022-11-10 LAB — MAGNESIUM: Magnesium: 2.3 mg/dL (ref 1.7–2.4)

## 2022-11-10 LAB — BASIC METABOLIC PANEL
Anion gap: 11 (ref 5–15)
BUN: 52 mg/dL — ABNORMAL HIGH (ref 8–23)
CO2: 26 mmol/L (ref 22–32)
Calcium: 8.6 mg/dL — ABNORMAL LOW (ref 8.9–10.3)
Chloride: 95 mmol/L — ABNORMAL LOW (ref 98–111)
Creatinine, Ser: 1.69 mg/dL — ABNORMAL HIGH (ref 0.61–1.24)
GFR, Estimated: 44 mL/min — ABNORMAL LOW (ref >60)
Glucose, Bld: 266 mg/dL — ABNORMAL HIGH (ref 70–99)
Potassium: 4.3 mmol/L (ref 3.5–5.1)
Sodium: 132 mmol/L — ABNORMAL LOW (ref 135–145)

## 2022-11-10 LAB — BLOOD GAS, VENOUS
Acid-Base Excess: 4.5 mmol/L — ABNORMAL HIGH (ref 0.0–2.0)
Bicarbonate: 30.6 mmol/L — ABNORMAL HIGH (ref 20.0–28.0)
Drawn by: 1528
O2 Saturation: 23.3 %
Patient temperature: 36.5
pCO2, Ven: 52 mm[Hg] (ref 44–60)
pH, Ven: 7.38 (ref 7.25–7.43)
pO2, Ven: <31 mm[Hg] — CL (ref 32–45)

## 2022-11-10 LAB — PROCALCITONIN: Procalcitonin: 0.22 ng/mL

## 2022-11-10 LAB — AMMONIA: Ammonia: <10 umol/L (ref 9–35)

## 2022-11-10 MED ORDER — LINACLOTIDE 145 MCG PO CAPS
145.0000 ug | ORAL_CAPSULE | Freq: Every day | ORAL | Status: DC
  Administered 2022-11-10 – 2022-11-15 (×6): 145 ug via ORAL
  Filled 2022-11-10 (×6): qty 1

## 2022-11-10 MED ORDER — LINACLOTIDE 145 MCG PO CAPS: 145.0000 ug | ORAL_CAPSULE | Freq: Every day | ORAL | Status: DC

## 2022-11-10 MED ORDER — THIAMINE HCL 100 MG/ML IJ SOLN
500.0000 mg | Freq: Three times a day (TID) | INTRAVENOUS | Status: AC
  Administered 2022-11-10 – 2022-11-12 (×6): 500 mg via INTRAVENOUS
  Filled 2022-11-10 (×2): qty 5
  Filled 2022-11-10: qty 2
  Filled 2022-11-10 (×6): qty 5

## 2022-11-10 MED ORDER — FOLIC ACID 1 MG PO TABS
1.0000 mg | ORAL_TABLET | Freq: Every day | ORAL | Status: DC
Start: 2022-11-10 — End: 2022-11-15
  Administered 2022-11-10 – 2022-11-15 (×6): 1 mg via ORAL
  Filled 2022-11-10 (×6): qty 1

## 2022-11-10 MED ORDER — TORSEMIDE 20 MG PO TABS
20.0000 mg | ORAL_TABLET | Freq: Every day | ORAL | Status: DC
  Administered 2022-11-10 – 2022-11-11 (×2): 20 mg via ORAL
  Filled 2022-11-10 (×3): qty 1

## 2022-11-10 MED ORDER — BISACODYL 5 MG PO TBEC
15.0000 mg | DELAYED_RELEASE_TABLET | Freq: Once | ORAL | Status: AC
  Administered 2022-11-10: 15 mg via ORAL
  Filled 2022-11-10: qty 3

## 2022-11-10 MED ORDER — THIAMINE HCL 100 MG/ML IJ SOLN
INTRAMUSCULAR | Status: AC
  Filled 2022-11-10: qty 6

## 2022-11-10 MED ORDER — INSULIN ASPART 100 UNIT/ML IJ SOLN
0.0000 [IU] | Freq: Every day | INTRAMUSCULAR | Status: DC
  Administered 2022-11-11: 5 [IU] via SUBCUTANEOUS
  Administered 2022-11-13: 2 [IU] via SUBCUTANEOUS

## 2022-11-10 MED ORDER — PEG 3350-KCL-NA BICARB-NACL 420 G PO SOLR: 4000.0000 mL | Freq: Once | ORAL | Status: DC

## 2022-11-10 MED ORDER — INSULIN ASPART 100 UNIT/ML IJ SOLN
0.0000 [IU] | Freq: Three times a day (TID) | INTRAMUSCULAR | Status: DC
  Administered 2022-11-10: 20 [IU] via SUBCUTANEOUS
  Administered 2022-11-10: 11 [IU] via SUBCUTANEOUS
  Administered 2022-11-11: 20 [IU] via SUBCUTANEOUS
  Administered 2022-11-12: 15 [IU] via SUBCUTANEOUS
  Administered 2022-11-12: 4 [IU] via SUBCUTANEOUS
  Administered 2022-11-13: 3 [IU] via SUBCUTANEOUS
  Administered 2022-11-13: 11 [IU] via SUBCUTANEOUS
  Administered 2022-11-13: 7 [IU] via SUBCUTANEOUS
  Administered 2022-11-14: 15 [IU] via SUBCUTANEOUS
  Administered 2022-11-14: 7 [IU] via SUBCUTANEOUS
  Administered 2022-11-14: 4 [IU] via SUBCUTANEOUS
  Administered 2022-11-15: 7 [IU] via SUBCUTANEOUS
  Administered 2022-11-15: 4 [IU] via SUBCUTANEOUS

## 2022-11-10 NOTE — Consult Note (Signed)
Gastroenterology Consult   Referring Provider: No ref. provider found Primary Care Physician:  Benetta Spar, MD Primary Gastroenterologist: unassigned (Dr. Tasia Catchings)  Patient ID: [name]; [id number]; 12/31/1955   Admit date: 11/02/2022  LOS: 7 days   Date of Consultation: 11/10/2022  Reason for Consultation:  IDA    History of Present Illness   [name] is a 67 y.o. male with past medical history significant for CAD status post stenting, peripheral vascular disease status post stenting, CKD, cocaine abuse current positive urine drug screen, diabetes, hypertension, GERD, Fournier gangrene, hyperlipidemia, ischemic cardiomyopathy, left ventricular mural thrombus, sleep apnea, meningioma who presented to the ED on October 23 with migratory abdominal pain/back/pelvic pain and dyspnea.  Recent admission, discharged on October 15 for acute CHF with gradual increasing pain in the scrotum, swelling of the scrotum, leg edema.  Patient admitted with acute hypoxic respiratory failure due to acute on chronic HFrEF.  Developed delirium October 25, head CT nonacute.  MRI brain showed chronic meningioma with edema without midline shift.  Hospitalization prolonged secondary to fluid overload requiring aggressive diuresis.  GI now being consulted for iron deficiency anemia.  On admission patient's hemoglobin was 9.3, had been 9.1 two weeks prior.  Hemoglobin had dropped down to 7.4, MCV 76.7.  Patient received 1 unit of blood and hemoglobin 9.0 today.  Ferritin 13, iron 32, iron sats 8%, TIBC 420, B12 576, folate 7.4.  Sed rate 80.  CRP 1.3.  Urine drug screen positive for cocaine.  BUN 52, creatinine 1.69, sodium 132, albumin 2.8, total bilirubin 1.2, alk phos 98, AST 16, ALT 14.  Patient denies melena. Rarely has brbpr. BM 1-2 times per week. No heartburn. Had some abdominal pain but gone now, improved after blood transfusion. N/v earlier in admission but now his appetite is much  better. Hungry now. Still with some DOE and legs feel wobbly when walking to nursing station. Was able to get up and bathe himself today. Denies ASA use. Takes occasional Advil for back pain. Patient states last cocaine use in 09/2022.   Colonoscopy 2008: 2 small hyperplastic polyps removed.  External hemorrhoids.   Prior to Admission medications   Medication Sig Start Date End Date Taking? Authorizing Provider  furosemide (LASIX) 40 MG tablet Take 1 tablet (40 mg total) by mouth daily. 10/25/22 11/24/22 Yes Vassie Loll, MD  gabapentin (NEURONTIN) 300 MG capsule Take 1 capsule (300 mg total) by mouth 3 (three) times daily. 10/25/22  Yes Vassie Loll, MD  nicotine (NICODERM CQ - DOSED IN MG/24 HOURS) 21 mg/24hr patch Place 1 patch (21 mg total) onto the skin daily. 10/26/22  Yes Vassie Loll, MD  atorvastatin (LIPITOR) 40 MG tablet Take 1 tablet (40 mg total) by mouth at bedtime. Patient not taking: Reported on 11/02/2022 10/25/22 11/24/22  Vassie Loll, MD  carvedilol (COREG) 3.125 MG tablet Take 1 tablet (3.125 mg total) by mouth 2 (two) times daily with a meal. Patient not taking: Reported on 11/02/2022 10/25/22 11/24/22  Vassie Loll, MD  empagliflozin (JARDIANCE) 10 MG TABS tablet Take 1 tablet (10 mg total) by mouth daily. Patient not taking: Reported on 11/02/2022 10/26/22   Vassie Loll, MD  pantoprazole (PROTONIX) 40 MG tablet Take 1 tablet (40 mg total) by mouth daily. Patient not taking: Reported on 11/02/2022 10/26/22   Vassie Loll, MD  sildenafil (VIAGRA) 50 MG tablet Take 50 mg by mouth daily as needed for erectile dysfunction. Patient not taking: Reported on 11/02/2022 08/26/22  Gastroenterology Consult   Referring Provider: No ref. provider found Primary Care Physician:  Benetta Spar, MD Primary Gastroenterologist: unassigned (Dr. Tasia Catchings)  Patient ID: [name]; [id number]; 12/31/1955   Admit date: 11/02/2022  LOS: 7 days   Date of Consultation: 11/10/2022  Reason for Consultation:  IDA    History of Present Illness   [name] is a 67 y.o. male with past medical history significant for CAD status post stenting, peripheral vascular disease status post stenting, CKD, cocaine abuse current positive urine drug screen, diabetes, hypertension, GERD, Fournier gangrene, hyperlipidemia, ischemic cardiomyopathy, left ventricular mural thrombus, sleep apnea, meningioma who presented to the ED on October 23 with migratory abdominal pain/back/pelvic pain and dyspnea.  Recent admission, discharged on October 15 for acute CHF with gradual increasing pain in the scrotum, swelling of the scrotum, leg edema.  Patient admitted with acute hypoxic respiratory failure due to acute on chronic HFrEF.  Developed delirium October 25, head CT nonacute.  MRI brain showed chronic meningioma with edema without midline shift.  Hospitalization prolonged secondary to fluid overload requiring aggressive diuresis.  GI now being consulted for iron deficiency anemia.  On admission patient's hemoglobin was 9.3, had been 9.1 two weeks prior.  Hemoglobin had dropped down to 7.4, MCV 76.7.  Patient received 1 unit of blood and hemoglobin 9.0 today.  Ferritin 13, iron 32, iron sats 8%, TIBC 420, B12 576, folate 7.4.  Sed rate 80.  CRP 1.3.  Urine drug screen positive for cocaine.  BUN 52, creatinine 1.69, sodium 132, albumin 2.8, total bilirubin 1.2, alk phos 98, AST 16, ALT 14.  Patient denies melena. Rarely has brbpr. BM 1-2 times per week. No heartburn. Had some abdominal pain but gone now, improved after blood transfusion. N/v earlier in admission but now his appetite is much  better. Hungry now. Still with some DOE and legs feel wobbly when walking to nursing station. Was able to get up and bathe himself today. Denies ASA use. Takes occasional Advil for back pain. Patient states last cocaine use in 09/2022.   Colonoscopy 2008: 2 small hyperplastic polyps removed.  External hemorrhoids.   Prior to Admission medications   Medication Sig Start Date End Date Taking? Authorizing Provider  furosemide (LASIX) 40 MG tablet Take 1 tablet (40 mg total) by mouth daily. 10/25/22 11/24/22 Yes Vassie Loll, MD  gabapentin (NEURONTIN) 300 MG capsule Take 1 capsule (300 mg total) by mouth 3 (three) times daily. 10/25/22  Yes Vassie Loll, MD  nicotine (NICODERM CQ - DOSED IN MG/24 HOURS) 21 mg/24hr patch Place 1 patch (21 mg total) onto the skin daily. 10/26/22  Yes Vassie Loll, MD  atorvastatin (LIPITOR) 40 MG tablet Take 1 tablet (40 mg total) by mouth at bedtime. Patient not taking: Reported on 11/02/2022 10/25/22 11/24/22  Vassie Loll, MD  carvedilol (COREG) 3.125 MG tablet Take 1 tablet (3.125 mg total) by mouth 2 (two) times daily with a meal. Patient not taking: Reported on 11/02/2022 10/25/22 11/24/22  Vassie Loll, MD  empagliflozin (JARDIANCE) 10 MG TABS tablet Take 1 tablet (10 mg total) by mouth daily. Patient not taking: Reported on 11/02/2022 10/26/22   Vassie Loll, MD  pantoprazole (PROTONIX) 40 MG tablet Take 1 tablet (40 mg total) by mouth daily. Patient not taking: Reported on 11/02/2022 10/26/22   Vassie Loll, MD  sildenafil (VIAGRA) 50 MG tablet Take 50 mg by mouth daily as needed for erectile dysfunction. Patient not taking: Reported on 11/02/2022 08/26/22  Gastroenterology Consult   Referring Provider: No ref. provider found Primary Care Physician:  Benetta Spar, MD Primary Gastroenterologist: unassigned (Dr. Tasia Catchings)  Patient ID: [name]; [id number]; 12/31/1955   Admit date: 11/02/2022  LOS: 7 days   Date of Consultation: 11/10/2022  Reason for Consultation:  IDA    History of Present Illness   [name] is a 67 y.o. male with past medical history significant for CAD status post stenting, peripheral vascular disease status post stenting, CKD, cocaine abuse current positive urine drug screen, diabetes, hypertension, GERD, Fournier gangrene, hyperlipidemia, ischemic cardiomyopathy, left ventricular mural thrombus, sleep apnea, meningioma who presented to the ED on October 23 with migratory abdominal pain/back/pelvic pain and dyspnea.  Recent admission, discharged on October 15 for acute CHF with gradual increasing pain in the scrotum, swelling of the scrotum, leg edema.  Patient admitted with acute hypoxic respiratory failure due to acute on chronic HFrEF.  Developed delirium October 25, head CT nonacute.  MRI brain showed chronic meningioma with edema without midline shift.  Hospitalization prolonged secondary to fluid overload requiring aggressive diuresis.  GI now being consulted for iron deficiency anemia.  On admission patient's hemoglobin was 9.3, had been 9.1 two weeks prior.  Hemoglobin had dropped down to 7.4, MCV 76.7.  Patient received 1 unit of blood and hemoglobin 9.0 today.  Ferritin 13, iron 32, iron sats 8%, TIBC 420, B12 576, folate 7.4.  Sed rate 80.  CRP 1.3.  Urine drug screen positive for cocaine.  BUN 52, creatinine 1.69, sodium 132, albumin 2.8, total bilirubin 1.2, alk phos 98, AST 16, ALT 14.  Patient denies melena. Rarely has brbpr. BM 1-2 times per week. No heartburn. Had some abdominal pain but gone now, improved after blood transfusion. N/v earlier in admission but now his appetite is much  better. Hungry now. Still with some DOE and legs feel wobbly when walking to nursing station. Was able to get up and bathe himself today. Denies ASA use. Takes occasional Advil for back pain. Patient states last cocaine use in 09/2022.   Colonoscopy 2008: 2 small hyperplastic polyps removed.  External hemorrhoids.   Prior to Admission medications   Medication Sig Start Date End Date Taking? Authorizing Provider  furosemide (LASIX) 40 MG tablet Take 1 tablet (40 mg total) by mouth daily. 10/25/22 11/24/22 Yes Vassie Loll, MD  gabapentin (NEURONTIN) 300 MG capsule Take 1 capsule (300 mg total) by mouth 3 (three) times daily. 10/25/22  Yes Vassie Loll, MD  nicotine (NICODERM CQ - DOSED IN MG/24 HOURS) 21 mg/24hr patch Place 1 patch (21 mg total) onto the skin daily. 10/26/22  Yes Vassie Loll, MD  atorvastatin (LIPITOR) 40 MG tablet Take 1 tablet (40 mg total) by mouth at bedtime. Patient not taking: Reported on 11/02/2022 10/25/22 11/24/22  Vassie Loll, MD  carvedilol (COREG) 3.125 MG tablet Take 1 tablet (3.125 mg total) by mouth 2 (two) times daily with a meal. Patient not taking: Reported on 11/02/2022 10/25/22 11/24/22  Vassie Loll, MD  empagliflozin (JARDIANCE) 10 MG TABS tablet Take 1 tablet (10 mg total) by mouth daily. Patient not taking: Reported on 11/02/2022 10/26/22   Vassie Loll, MD  pantoprazole (PROTONIX) 40 MG tablet Take 1 tablet (40 mg total) by mouth daily. Patient not taking: Reported on 11/02/2022 10/26/22   Vassie Loll, MD  sildenafil (VIAGRA) 50 MG tablet Take 50 mg by mouth daily as needed for erectile dysfunction. Patient not taking: Reported on 11/02/2022 08/26/22  and pelvis was performed using the  standard protocol following bolus administration of intravenous contrast. RADIATION DOSE REDUCTION: This exam was performed according to the departmental dose-optimization program which includes automated exposure control, adjustment of the mA and/or kV according to patient size and/or use of iterative reconstruction technique. CONTRAST:  OMNIPAQUE IOHEXOL 300 MG/ML  SOLN COMPARISON:  None Available. FINDINGS: Lower chest: Moderate pleural effusion Hepatobiliary: No focal hepatic lesion. Two tiny gallstones measuring less than 5 mm each. No gallbladder distension. No biliary duct dilatation. Common bile duct is normal. Pancreas: Pancreas is normal. No ductal dilatation. No pancreatic inflammation. Spleen: Normal spleen Adrenals/urinary tract: Adrenal glands normal. No nephrolithiasis or ureterolithiasis. Perinephric stranding similar comparison CT. No renal obstruction on delayed imaging. Bladder normal. Stomach/Bowel: Stomach, small-bowel and cecum are normal. The appendix is not identified but there is no pericecal inflammation to suggest appendicitis. Insert Vascular/Lymphatic: Abdominal aorta is normal caliber with atherosclerotic calcification. There is no retroperitoneal or periportal lymphadenopathy. No pelvic lymphadenopathy. Reproductive: Prostate normal Other: small volume of free fluid along the LEFT and RIGHT pericolic gutter. Musculoskeletal: No aggressive osseous lesion. IMPRESSION: 1. No nephrolithiasis or ureterolithiasis. 2. Perinephric stranding similar to comparison CT. No renal obstruction on delayed imaging. 3. Moderate pleural effusion. 4. Small volume of free fluid along the LEFT and RIGHT pericolic gutter. 5. Cholelithiasis without evidence of cholecystitis. 6.  Aortic Atherosclerosis (ICD10-I70.0). Electronically Signed   By: Genevive Bi M.D.   On: 11/01/2022 16:45   US SCROTUM W/DOPPLER  Result Date: 11/01/2022 CLINICAL DATA:  Testicular pain EXAM: SCROTAL ULTRASOUND DOPPLER  ULTRASOUND OF THE TESTICLES TECHNIQUE: Complete ultrasound examination of the testicles, epididymis, and other scrotal structures was performed. Color and spectral Doppler ultrasound were also utilized to evaluate blood flow to the testicles. COMPARISON:  None Available. FINDINGS: Right testicle Measurements: 3.3 x 1.6 x 2.8 cm. No mass or microlithiasis visualized. Left testicle Measurements: 3.4 x 2.2 x 2.2 cm. No mass or microlithiasis visualized. Right epididymis:  Normal in size and appearance. Left epididymis: Epididymal head cyst with internal echogenic debris measuring 1.1 x 1.0 x 1.0 cm. Hydrocele:  Small left hydrocele. Varicocele:  None visualized. Pulsed Doppler interrogation of both testes demonstrates normal low resistance arterial and venous waveforms bilaterally. IMPRESSION: 1. Complex left epididymal head cyst measuring up to 1.1 cm. 2. Small left hydrocele. Electronically Signed   By: Allegra Lai M.D.   On: 11/01/2022 11:46   ECHOCARDIOGRAM COMPLETE  Result Date: 10/21/2022    ECHOCARDIOGRAM REPORT   Patient Name:   MINH LIBERTINI Date of Exam: 10/21/2022 Medical Rec #:  [id number]        Height:       69.0 in Accession #:    [id number]       Weight:       260.0 lb Date of Birth:  Apr 23, 1955        BSA:          2.310 m Patient Age:    67 years         BP:           104/78 mmHg Patient Gender: M                HR:           74 bpm. [facility] Procedure: 2D Echo, Cardiac Doppler and Color Doppler Indications:    CHF- Acute Systolic I50.21  History:        Patient has prior history  and pelvis was performed using the  standard protocol following bolus administration of intravenous contrast. RADIATION DOSE REDUCTION: This exam was performed according to the departmental dose-optimization program which includes automated exposure control, adjustment of the mA and/or kV according to patient size and/or use of iterative reconstruction technique. CONTRAST:  OMNIPAQUE IOHEXOL 300 MG/ML  SOLN COMPARISON:  None Available. FINDINGS: Lower chest: Moderate pleural effusion Hepatobiliary: No focal hepatic lesion. Two tiny gallstones measuring less than 5 mm each. No gallbladder distension. No biliary duct dilatation. Common bile duct is normal. Pancreas: Pancreas is normal. No ductal dilatation. No pancreatic inflammation. Spleen: Normal spleen Adrenals/urinary tract: Adrenal glands normal. No nephrolithiasis or ureterolithiasis. Perinephric stranding similar comparison CT. No renal obstruction on delayed imaging. Bladder normal. Stomach/Bowel: Stomach, small-bowel and cecum are normal. The appendix is not identified but there is no pericecal inflammation to suggest appendicitis. Insert Vascular/Lymphatic: Abdominal aorta is normal caliber with atherosclerotic calcification. There is no retroperitoneal or periportal lymphadenopathy. No pelvic lymphadenopathy. Reproductive: Prostate normal Other: small volume of free fluid along the LEFT and RIGHT pericolic gutter. Musculoskeletal: No aggressive osseous lesion. IMPRESSION: 1. No nephrolithiasis or ureterolithiasis. 2. Perinephric stranding similar to comparison CT. No renal obstruction on delayed imaging. 3. Moderate pleural effusion. 4. Small volume of free fluid along the LEFT and RIGHT pericolic gutter. 5. Cholelithiasis without evidence of cholecystitis. 6.  Aortic Atherosclerosis (ICD10-I70.0). Electronically Signed   By: Genevive Bi M.D.   On: 11/01/2022 16:45   US SCROTUM W/DOPPLER  Result Date: 11/01/2022 CLINICAL DATA:  Testicular pain EXAM: SCROTAL ULTRASOUND DOPPLER  ULTRASOUND OF THE TESTICLES TECHNIQUE: Complete ultrasound examination of the testicles, epididymis, and other scrotal structures was performed. Color and spectral Doppler ultrasound were also utilized to evaluate blood flow to the testicles. COMPARISON:  None Available. FINDINGS: Right testicle Measurements: 3.3 x 1.6 x 2.8 cm. No mass or microlithiasis visualized. Left testicle Measurements: 3.4 x 2.2 x 2.2 cm. No mass or microlithiasis visualized. Right epididymis:  Normal in size and appearance. Left epididymis: Epididymal head cyst with internal echogenic debris measuring 1.1 x 1.0 x 1.0 cm. Hydrocele:  Small left hydrocele. Varicocele:  None visualized. Pulsed Doppler interrogation of both testes demonstrates normal low resistance arterial and venous waveforms bilaterally. IMPRESSION: 1. Complex left epididymal head cyst measuring up to 1.1 cm. 2. Small left hydrocele. Electronically Signed   By: Allegra Lai M.D.   On: 11/01/2022 11:46   ECHOCARDIOGRAM COMPLETE  Result Date: 10/21/2022    ECHOCARDIOGRAM REPORT   Patient Name:   MINH LIBERTINI Date of Exam: 10/21/2022 Medical Rec #:  [id number]        Height:       69.0 in Accession #:    [id number]       Weight:       260.0 lb Date of Birth:  Apr 23, 1955        BSA:          2.310 m Patient Age:    67 years         BP:           104/78 mmHg Patient Gender: M                HR:           74 bpm. [facility] Procedure: 2D Echo, Cardiac Doppler and Color Doppler Indications:    CHF- Acute Systolic I50.21  History:        Patient has prior history  Gastroenterology Consult   Referring Provider: No ref. provider found Primary Care Physician:  Benetta Spar, MD Primary Gastroenterologist: unassigned (Dr. Tasia Catchings)  Patient ID: [name]; [id number]; 12/31/1955   Admit date: 11/02/2022  LOS: 7 days   Date of Consultation: 11/10/2022  Reason for Consultation:  IDA    History of Present Illness   [name] is a 67 y.o. male with past medical history significant for CAD status post stenting, peripheral vascular disease status post stenting, CKD, cocaine abuse current positive urine drug screen, diabetes, hypertension, GERD, Fournier gangrene, hyperlipidemia, ischemic cardiomyopathy, left ventricular mural thrombus, sleep apnea, meningioma who presented to the ED on October 23 with migratory abdominal pain/back/pelvic pain and dyspnea.  Recent admission, discharged on October 15 for acute CHF with gradual increasing pain in the scrotum, swelling of the scrotum, leg edema.  Patient admitted with acute hypoxic respiratory failure due to acute on chronic HFrEF.  Developed delirium October 25, head CT nonacute.  MRI brain showed chronic meningioma with edema without midline shift.  Hospitalization prolonged secondary to fluid overload requiring aggressive diuresis.  GI now being consulted for iron deficiency anemia.  On admission patient's hemoglobin was 9.3, had been 9.1 two weeks prior.  Hemoglobin had dropped down to 7.4, MCV 76.7.  Patient received 1 unit of blood and hemoglobin 9.0 today.  Ferritin 13, iron 32, iron sats 8%, TIBC 420, B12 576, folate 7.4.  Sed rate 80.  CRP 1.3.  Urine drug screen positive for cocaine.  BUN 52, creatinine 1.69, sodium 132, albumin 2.8, total bilirubin 1.2, alk phos 98, AST 16, ALT 14.  Patient denies melena. Rarely has brbpr. BM 1-2 times per week. No heartburn. Had some abdominal pain but gone now, improved after blood transfusion. N/v earlier in admission but now his appetite is much  better. Hungry now. Still with some DOE and legs feel wobbly when walking to nursing station. Was able to get up and bathe himself today. Denies ASA use. Takes occasional Advil for back pain. Patient states last cocaine use in 09/2022.   Colonoscopy 2008: 2 small hyperplastic polyps removed.  External hemorrhoids.   Prior to Admission medications   Medication Sig Start Date End Date Taking? Authorizing Provider  furosemide (LASIX) 40 MG tablet Take 1 tablet (40 mg total) by mouth daily. 10/25/22 11/24/22 Yes Vassie Loll, MD  gabapentin (NEURONTIN) 300 MG capsule Take 1 capsule (300 mg total) by mouth 3 (three) times daily. 10/25/22  Yes Vassie Loll, MD  nicotine (NICODERM CQ - DOSED IN MG/24 HOURS) 21 mg/24hr patch Place 1 patch (21 mg total) onto the skin daily. 10/26/22  Yes Vassie Loll, MD  atorvastatin (LIPITOR) 40 MG tablet Take 1 tablet (40 mg total) by mouth at bedtime. Patient not taking: Reported on 11/02/2022 10/25/22 11/24/22  Vassie Loll, MD  carvedilol (COREG) 3.125 MG tablet Take 1 tablet (3.125 mg total) by mouth 2 (two) times daily with a meal. Patient not taking: Reported on 11/02/2022 10/25/22 11/24/22  Vassie Loll, MD  empagliflozin (JARDIANCE) 10 MG TABS tablet Take 1 tablet (10 mg total) by mouth daily. Patient not taking: Reported on 11/02/2022 10/26/22   Vassie Loll, MD  pantoprazole (PROTONIX) 40 MG tablet Take 1 tablet (40 mg total) by mouth daily. Patient not taking: Reported on 11/02/2022 10/26/22   Vassie Loll, MD  sildenafil (VIAGRA) 50 MG tablet Take 50 mg by mouth daily as needed for erectile dysfunction. Patient not taking: Reported on 11/02/2022 08/26/22  Gastroenterology Consult   Referring Provider: No ref. provider found Primary Care Physician:  Benetta Spar, MD Primary Gastroenterologist: unassigned (Dr. Tasia Catchings)  Patient ID: [name]; [id number]; 12/31/1955   Admit date: 11/02/2022  LOS: 7 days   Date of Consultation: 11/10/2022  Reason for Consultation:  IDA    History of Present Illness   [name] is a 67 y.o. male with past medical history significant for CAD status post stenting, peripheral vascular disease status post stenting, CKD, cocaine abuse current positive urine drug screen, diabetes, hypertension, GERD, Fournier gangrene, hyperlipidemia, ischemic cardiomyopathy, left ventricular mural thrombus, sleep apnea, meningioma who presented to the ED on October 23 with migratory abdominal pain/back/pelvic pain and dyspnea.  Recent admission, discharged on October 15 for acute CHF with gradual increasing pain in the scrotum, swelling of the scrotum, leg edema.  Patient admitted with acute hypoxic respiratory failure due to acute on chronic HFrEF.  Developed delirium October 25, head CT nonacute.  MRI brain showed chronic meningioma with edema without midline shift.  Hospitalization prolonged secondary to fluid overload requiring aggressive diuresis.  GI now being consulted for iron deficiency anemia.  On admission patient's hemoglobin was 9.3, had been 9.1 two weeks prior.  Hemoglobin had dropped down to 7.4, MCV 76.7.  Patient received 1 unit of blood and hemoglobin 9.0 today.  Ferritin 13, iron 32, iron sats 8%, TIBC 420, B12 576, folate 7.4.  Sed rate 80.  CRP 1.3.  Urine drug screen positive for cocaine.  BUN 52, creatinine 1.69, sodium 132, albumin 2.8, total bilirubin 1.2, alk phos 98, AST 16, ALT 14.  Patient denies melena. Rarely has brbpr. BM 1-2 times per week. No heartburn. Had some abdominal pain but gone now, improved after blood transfusion. N/v earlier in admission but now his appetite is much  better. Hungry now. Still with some DOE and legs feel wobbly when walking to nursing station. Was able to get up and bathe himself today. Denies ASA use. Takes occasional Advil for back pain. Patient states last cocaine use in 09/2022.   Colonoscopy 2008: 2 small hyperplastic polyps removed.  External hemorrhoids.   Prior to Admission medications   Medication Sig Start Date End Date Taking? Authorizing Provider  furosemide (LASIX) 40 MG tablet Take 1 tablet (40 mg total) by mouth daily. 10/25/22 11/24/22 Yes Vassie Loll, MD  gabapentin (NEURONTIN) 300 MG capsule Take 1 capsule (300 mg total) by mouth 3 (three) times daily. 10/25/22  Yes Vassie Loll, MD  nicotine (NICODERM CQ - DOSED IN MG/24 HOURS) 21 mg/24hr patch Place 1 patch (21 mg total) onto the skin daily. 10/26/22  Yes Vassie Loll, MD  atorvastatin (LIPITOR) 40 MG tablet Take 1 tablet (40 mg total) by mouth at bedtime. Patient not taking: Reported on 11/02/2022 10/25/22 11/24/22  Vassie Loll, MD  carvedilol (COREG) 3.125 MG tablet Take 1 tablet (3.125 mg total) by mouth 2 (two) times daily with a meal. Patient not taking: Reported on 11/02/2022 10/25/22 11/24/22  Vassie Loll, MD  empagliflozin (JARDIANCE) 10 MG TABS tablet Take 1 tablet (10 mg total) by mouth daily. Patient not taking: Reported on 11/02/2022 10/26/22   Vassie Loll, MD  pantoprazole (PROTONIX) 40 MG tablet Take 1 tablet (40 mg total) by mouth daily. Patient not taking: Reported on 11/02/2022 10/26/22   Vassie Loll, MD  sildenafil (VIAGRA) 50 MG tablet Take 50 mg by mouth daily as needed for erectile dysfunction. Patient not taking: Reported on 11/02/2022 08/26/22  Gastroenterology Consult   Referring Provider: No ref. provider found Primary Care Physician:  Benetta Spar, MD Primary Gastroenterologist: unassigned (Dr. Tasia Catchings)  Patient ID: [name]; [id number]; 12/31/1955   Admit date: 11/02/2022  LOS: 7 days   Date of Consultation: 11/10/2022  Reason for Consultation:  IDA    History of Present Illness   [name] is a 67 y.o. male with past medical history significant for CAD status post stenting, peripheral vascular disease status post stenting, CKD, cocaine abuse current positive urine drug screen, diabetes, hypertension, GERD, Fournier gangrene, hyperlipidemia, ischemic cardiomyopathy, left ventricular mural thrombus, sleep apnea, meningioma who presented to the ED on October 23 with migratory abdominal pain/back/pelvic pain and dyspnea.  Recent admission, discharged on October 15 for acute CHF with gradual increasing pain in the scrotum, swelling of the scrotum, leg edema.  Patient admitted with acute hypoxic respiratory failure due to acute on chronic HFrEF.  Developed delirium October 25, head CT nonacute.  MRI brain showed chronic meningioma with edema without midline shift.  Hospitalization prolonged secondary to fluid overload requiring aggressive diuresis.  GI now being consulted for iron deficiency anemia.  On admission patient's hemoglobin was 9.3, had been 9.1 two weeks prior.  Hemoglobin had dropped down to 7.4, MCV 76.7.  Patient received 1 unit of blood and hemoglobin 9.0 today.  Ferritin 13, iron 32, iron sats 8%, TIBC 420, B12 576, folate 7.4.  Sed rate 80.  CRP 1.3.  Urine drug screen positive for cocaine.  BUN 52, creatinine 1.69, sodium 132, albumin 2.8, total bilirubin 1.2, alk phos 98, AST 16, ALT 14.  Patient denies melena. Rarely has brbpr. BM 1-2 times per week. No heartburn. Had some abdominal pain but gone now, improved after blood transfusion. N/v earlier in admission but now his appetite is much  better. Hungry now. Still with some DOE and legs feel wobbly when walking to nursing station. Was able to get up and bathe himself today. Denies ASA use. Takes occasional Advil for back pain. Patient states last cocaine use in 09/2022.   Colonoscopy 2008: 2 small hyperplastic polyps removed.  External hemorrhoids.   Prior to Admission medications   Medication Sig Start Date End Date Taking? Authorizing Provider  furosemide (LASIX) 40 MG tablet Take 1 tablet (40 mg total) by mouth daily. 10/25/22 11/24/22 Yes Vassie Loll, MD  gabapentin (NEURONTIN) 300 MG capsule Take 1 capsule (300 mg total) by mouth 3 (three) times daily. 10/25/22  Yes Vassie Loll, MD  nicotine (NICODERM CQ - DOSED IN MG/24 HOURS) 21 mg/24hr patch Place 1 patch (21 mg total) onto the skin daily. 10/26/22  Yes Vassie Loll, MD  atorvastatin (LIPITOR) 40 MG tablet Take 1 tablet (40 mg total) by mouth at bedtime. Patient not taking: Reported on 11/02/2022 10/25/22 11/24/22  Vassie Loll, MD  carvedilol (COREG) 3.125 MG tablet Take 1 tablet (3.125 mg total) by mouth 2 (two) times daily with a meal. Patient not taking: Reported on 11/02/2022 10/25/22 11/24/22  Vassie Loll, MD  empagliflozin (JARDIANCE) 10 MG TABS tablet Take 1 tablet (10 mg total) by mouth daily. Patient not taking: Reported on 11/02/2022 10/26/22   Vassie Loll, MD  pantoprazole (PROTONIX) 40 MG tablet Take 1 tablet (40 mg total) by mouth daily. Patient not taking: Reported on 11/02/2022 10/26/22   Vassie Loll, MD  sildenafil (VIAGRA) 50 MG tablet Take 50 mg by mouth daily as needed for erectile dysfunction. Patient not taking: Reported on 11/02/2022 08/26/22  and pelvis was performed using the  standard protocol following bolus administration of intravenous contrast. RADIATION DOSE REDUCTION: This exam was performed according to the departmental dose-optimization program which includes automated exposure control, adjustment of the mA and/or kV according to patient size and/or use of iterative reconstruction technique. CONTRAST:  OMNIPAQUE IOHEXOL 300 MG/ML  SOLN COMPARISON:  None Available. FINDINGS: Lower chest: Moderate pleural effusion Hepatobiliary: No focal hepatic lesion. Two tiny gallstones measuring less than 5 mm each. No gallbladder distension. No biliary duct dilatation. Common bile duct is normal. Pancreas: Pancreas is normal. No ductal dilatation. No pancreatic inflammation. Spleen: Normal spleen Adrenals/urinary tract: Adrenal glands normal. No nephrolithiasis or ureterolithiasis. Perinephric stranding similar comparison CT. No renal obstruction on delayed imaging. Bladder normal. Stomach/Bowel: Stomach, small-bowel and cecum are normal. The appendix is not identified but there is no pericecal inflammation to suggest appendicitis. Insert Vascular/Lymphatic: Abdominal aorta is normal caliber with atherosclerotic calcification. There is no retroperitoneal or periportal lymphadenopathy. No pelvic lymphadenopathy. Reproductive: Prostate normal Other: small volume of free fluid along the LEFT and RIGHT pericolic gutter. Musculoskeletal: No aggressive osseous lesion. IMPRESSION: 1. No nephrolithiasis or ureterolithiasis. 2. Perinephric stranding similar to comparison CT. No renal obstruction on delayed imaging. 3. Moderate pleural effusion. 4. Small volume of free fluid along the LEFT and RIGHT pericolic gutter. 5. Cholelithiasis without evidence of cholecystitis. 6.  Aortic Atherosclerosis (ICD10-I70.0). Electronically Signed   By: Genevive Bi M.D.   On: 11/01/2022 16:45   US SCROTUM W/DOPPLER  Result Date: 11/01/2022 CLINICAL DATA:  Testicular pain EXAM: SCROTAL ULTRASOUND DOPPLER  ULTRASOUND OF THE TESTICLES TECHNIQUE: Complete ultrasound examination of the testicles, epididymis, and other scrotal structures was performed. Color and spectral Doppler ultrasound were also utilized to evaluate blood flow to the testicles. COMPARISON:  None Available. FINDINGS: Right testicle Measurements: 3.3 x 1.6 x 2.8 cm. No mass or microlithiasis visualized. Left testicle Measurements: 3.4 x 2.2 x 2.2 cm. No mass or microlithiasis visualized. Right epididymis:  Normal in size and appearance. Left epididymis: Epididymal head cyst with internal echogenic debris measuring 1.1 x 1.0 x 1.0 cm. Hydrocele:  Small left hydrocele. Varicocele:  None visualized. Pulsed Doppler interrogation of both testes demonstrates normal low resistance arterial and venous waveforms bilaterally. IMPRESSION: 1. Complex left epididymal head cyst measuring up to 1.1 cm. 2. Small left hydrocele. Electronically Signed   By: Allegra Lai M.D.   On: 11/01/2022 11:46   ECHOCARDIOGRAM COMPLETE  Result Date: 10/21/2022    ECHOCARDIOGRAM REPORT   Patient Name:   MINH LIBERTINI Date of Exam: 10/21/2022 Medical Rec #:  [id number]        Height:       69.0 in Accession #:    [id number]       Weight:       260.0 lb Date of Birth:  Apr 23, 1955        BSA:          2.310 m Patient Age:    67 years         BP:           104/78 mmHg Patient Gender: M                HR:           74 bpm. [facility] Procedure: 2D Echo, Cardiac Doppler and Color Doppler Indications:    CHF- Acute Systolic I50.21  History:        Patient has prior history  and pelvis was performed using the  standard protocol following bolus administration of intravenous contrast. RADIATION DOSE REDUCTION: This exam was performed according to the departmental dose-optimization program which includes automated exposure control, adjustment of the mA and/or kV according to patient size and/or use of iterative reconstruction technique. CONTRAST:  OMNIPAQUE IOHEXOL 300 MG/ML  SOLN COMPARISON:  None Available. FINDINGS: Lower chest: Moderate pleural effusion Hepatobiliary: No focal hepatic lesion. Two tiny gallstones measuring less than 5 mm each. No gallbladder distension. No biliary duct dilatation. Common bile duct is normal. Pancreas: Pancreas is normal. No ductal dilatation. No pancreatic inflammation. Spleen: Normal spleen Adrenals/urinary tract: Adrenal glands normal. No nephrolithiasis or ureterolithiasis. Perinephric stranding similar comparison CT. No renal obstruction on delayed imaging. Bladder normal. Stomach/Bowel: Stomach, small-bowel and cecum are normal. The appendix is not identified but there is no pericecal inflammation to suggest appendicitis. Insert Vascular/Lymphatic: Abdominal aorta is normal caliber with atherosclerotic calcification. There is no retroperitoneal or periportal lymphadenopathy. No pelvic lymphadenopathy. Reproductive: Prostate normal Other: small volume of free fluid along the LEFT and RIGHT pericolic gutter. Musculoskeletal: No aggressive osseous lesion. IMPRESSION: 1. No nephrolithiasis or ureterolithiasis. 2. Perinephric stranding similar to comparison CT. No renal obstruction on delayed imaging. 3. Moderate pleural effusion. 4. Small volume of free fluid along the LEFT and RIGHT pericolic gutter. 5. Cholelithiasis without evidence of cholecystitis. 6.  Aortic Atherosclerosis (ICD10-I70.0). Electronically Signed   By: Genevive Bi M.D.   On: 11/01/2022 16:45   US SCROTUM W/DOPPLER  Result Date: 11/01/2022 CLINICAL DATA:  Testicular pain EXAM: SCROTAL ULTRASOUND DOPPLER  ULTRASOUND OF THE TESTICLES TECHNIQUE: Complete ultrasound examination of the testicles, epididymis, and other scrotal structures was performed. Color and spectral Doppler ultrasound were also utilized to evaluate blood flow to the testicles. COMPARISON:  None Available. FINDINGS: Right testicle Measurements: 3.3 x 1.6 x 2.8 cm. No mass or microlithiasis visualized. Left testicle Measurements: 3.4 x 2.2 x 2.2 cm. No mass or microlithiasis visualized. Right epididymis:  Normal in size and appearance. Left epididymis: Epididymal head cyst with internal echogenic debris measuring 1.1 x 1.0 x 1.0 cm. Hydrocele:  Small left hydrocele. Varicocele:  None visualized. Pulsed Doppler interrogation of both testes demonstrates normal low resistance arterial and venous waveforms bilaterally. IMPRESSION: 1. Complex left epididymal head cyst measuring up to 1.1 cm. 2. Small left hydrocele. Electronically Signed   By: Allegra Lai M.D.   On: 11/01/2022 11:46   ECHOCARDIOGRAM COMPLETE  Result Date: 10/21/2022    ECHOCARDIOGRAM REPORT   Patient Name:   MINH LIBERTINI Date of Exam: 10/21/2022 Medical Rec #:  [id number]        Height:       69.0 in Accession #:    [id number]       Weight:       260.0 lb Date of Birth:  Apr 23, 1955        BSA:          2.310 m Patient Age:    67 years         BP:           104/78 mmHg Patient Gender: M                HR:           74 bpm. [facility] Procedure: 2D Echo, Cardiac Doppler and Color Doppler Indications:    CHF- Acute Systolic I50.21  History:        Patient has prior history  Gastroenterology Consult   Referring Provider: No ref. provider found Primary Care Physician:  Benetta Spar, MD Primary Gastroenterologist: unassigned (Dr. Tasia Catchings)  Patient ID: [name]; [id number]; 12/31/1955   Admit date: 11/02/2022  LOS: 7 days   Date of Consultation: 11/10/2022  Reason for Consultation:  IDA    History of Present Illness   [name] is a 67 y.o. male with past medical history significant for CAD status post stenting, peripheral vascular disease status post stenting, CKD, cocaine abuse current positive urine drug screen, diabetes, hypertension, GERD, Fournier gangrene, hyperlipidemia, ischemic cardiomyopathy, left ventricular mural thrombus, sleep apnea, meningioma who presented to the ED on October 23 with migratory abdominal pain/back/pelvic pain and dyspnea.  Recent admission, discharged on October 15 for acute CHF with gradual increasing pain in the scrotum, swelling of the scrotum, leg edema.  Patient admitted with acute hypoxic respiratory failure due to acute on chronic HFrEF.  Developed delirium October 25, head CT nonacute.  MRI brain showed chronic meningioma with edema without midline shift.  Hospitalization prolonged secondary to fluid overload requiring aggressive diuresis.  GI now being consulted for iron deficiency anemia.  On admission patient's hemoglobin was 9.3, had been 9.1 two weeks prior.  Hemoglobin had dropped down to 7.4, MCV 76.7.  Patient received 1 unit of blood and hemoglobin 9.0 today.  Ferritin 13, iron 32, iron sats 8%, TIBC 420, B12 576, folate 7.4.  Sed rate 80.  CRP 1.3.  Urine drug screen positive for cocaine.  BUN 52, creatinine 1.69, sodium 132, albumin 2.8, total bilirubin 1.2, alk phos 98, AST 16, ALT 14.  Patient denies melena. Rarely has brbpr. BM 1-2 times per week. No heartburn. Had some abdominal pain but gone now, improved after blood transfusion. N/v earlier in admission but now his appetite is much  better. Hungry now. Still with some DOE and legs feel wobbly when walking to nursing station. Was able to get up and bathe himself today. Denies ASA use. Takes occasional Advil for back pain. Patient states last cocaine use in 09/2022.   Colonoscopy 2008: 2 small hyperplastic polyps removed.  External hemorrhoids.   Prior to Admission medications   Medication Sig Start Date End Date Taking? Authorizing Provider  furosemide (LASIX) 40 MG tablet Take 1 tablet (40 mg total) by mouth daily. 10/25/22 11/24/22 Yes Vassie Loll, MD  gabapentin (NEURONTIN) 300 MG capsule Take 1 capsule (300 mg total) by mouth 3 (three) times daily. 10/25/22  Yes Vassie Loll, MD  nicotine (NICODERM CQ - DOSED IN MG/24 HOURS) 21 mg/24hr patch Place 1 patch (21 mg total) onto the skin daily. 10/26/22  Yes Vassie Loll, MD  atorvastatin (LIPITOR) 40 MG tablet Take 1 tablet (40 mg total) by mouth at bedtime. Patient not taking: Reported on 11/02/2022 10/25/22 11/24/22  Vassie Loll, MD  carvedilol (COREG) 3.125 MG tablet Take 1 tablet (3.125 mg total) by mouth 2 (two) times daily with a meal. Patient not taking: Reported on 11/02/2022 10/25/22 11/24/22  Vassie Loll, MD  empagliflozin (JARDIANCE) 10 MG TABS tablet Take 1 tablet (10 mg total) by mouth daily. Patient not taking: Reported on 11/02/2022 10/26/22   Vassie Loll, MD  pantoprazole (PROTONIX) 40 MG tablet Take 1 tablet (40 mg total) by mouth daily. Patient not taking: Reported on 11/02/2022 10/26/22   Vassie Loll, MD  sildenafil (VIAGRA) 50 MG tablet Take 50 mg by mouth daily as needed for erectile dysfunction. Patient not taking: Reported on 11/02/2022 08/26/22

## 2022-11-10 NOTE — Progress Notes (Addendum)
By: Agustin Cree M.D.   On: 11/02/2022 09:43   CT ABDOMEN PELVIS W CONTRAST  Result Date: 11/01/2022 CLINICAL DATA:  Abdominal pain and flank pain.  Stone suspected EXAM: CT ABDOMEN AND PELVIS WITH CONTRAST TECHNIQUE: Multidetector CT imaging of the abdomen and pelvis was performed using the standard protocol following bolus administration of intravenous contrast. RADIATION DOSE REDUCTION: This exam was performed according to the departmental dose-optimization program which includes automated exposure control, adjustment of the mA and/or kV according to patient size and/or use of iterative  reconstruction technique. CONTRAST:  OMNIPAQUE IOHEXOL 300 MG/ML  SOLN COMPARISON:  None Available. FINDINGS: Lower chest: Moderate pleural effusion Hepatobiliary: No focal hepatic lesion. Two tiny gallstones measuring less than 5 mm each. No gallbladder distension. No biliary duct dilatation. Common bile duct is normal. Pancreas: Pancreas is normal. No ductal dilatation. No pancreatic inflammation. Spleen: Normal spleen Adrenals/urinary tract: Adrenal glands normal. No nephrolithiasis or ureterolithiasis. Perinephric stranding similar comparison CT. No renal obstruction on delayed imaging. Bladder normal. Stomach/Bowel: Stomach, small-bowel and cecum are normal. The appendix is not identified but there is no pericecal inflammation to suggest appendicitis. Insert Vascular/Lymphatic: Abdominal aorta is normal caliber with atherosclerotic calcification. There is no retroperitoneal or periportal lymphadenopathy. No pelvic lymphadenopathy. Reproductive: Prostate normal Other: small volume of free fluid along the LEFT and RIGHT pericolic gutter. Musculoskeletal: No aggressive osseous lesion. IMPRESSION: 1. No nephrolithiasis or ureterolithiasis. 2. Perinephric stranding similar to comparison CT. No renal obstruction on delayed imaging. 3. Moderate pleural effusion. 4. Small volume of free fluid along the LEFT and RIGHT pericolic gutter. 5. Cholelithiasis without evidence of cholecystitis. 6.  Aortic Atherosclerosis (ICD10-I70.0). Electronically Signed   By: [name] M.D.   On: 11/01/2022 16:45   US SCROTUM W/DOPPLER  Result Date: 11/01/2022 CLINICAL DATA:  Testicular pain EXAM: SCROTAL ULTRASOUND DOPPLER ULTRASOUND OF THE TESTICLES TECHNIQUE: Complete ultrasound examination of the testicles, epididymis, and other scrotal structures was performed. Color and spectral Doppler ultrasound were also utilized to evaluate blood flow to the testicles. COMPARISON:  None Available. FINDINGS: Right testicle  Measurements: 3.3 x 1.6 x 2.8 cm. No mass or microlithiasis visualized. Left testicle Measurements: 3.4 x 2.2 x 2.2 cm. No mass or microlithiasis visualized. Right epididymis:  Normal in size and appearance. Left epididymis: Epididymal head cyst with internal echogenic debris measuring 1.1 x 1.0 x 1.0 cm. Hydrocele:  Small left hydrocele. Varicocele:  None visualized. Pulsed Doppler interrogation of both testes demonstrates normal low resistance arterial and venous waveforms bilaterally. IMPRESSION: 1. Complex left epididymal head cyst measuring up to 1.1 cm. 2. Small left hydrocele. Electronically Signed   By: [name] M.D.   On: 11/01/2022 11:46   ECHOCARDIOGRAM COMPLETE  Result Date: 10/21/2022    ECHOCARDIOGRAM REPORT   Patient Name:   [name] Date of Exam: 10/21/2022 Medical Rec #:  [id number]        Height:       69.0 in Accession #:    [id number]       Weight:       260.0 lb Date of Birth:  1955/02/11        BSA:          2.310 m Patient Age:    67 years         BP:           104/78 mmHg Patient Gender: M                HR:  PROGRESS NOTE  [name] ION:[id number] DOB: 1955/03/11 DOA: 11/02/2022 PCP: Benetta Spar, MD  Brief History:   67 y.o. male with a history of CAD, tobacco use, HTN, HLD, T2DM, GERD, history of cocaine use, OSA, and combined CHF (LVEF 20-25%, G2DD) who presented to the ED 10/23 with migratory abdominal/back/pelvic pain and dyspnea. He was admitted for recurrent acute on chronic combined CHF, [place] +cocaine. IV diuresis is underway with cardiology consultation. Developed delirium 10/25, head CT nonacute. Mentation has cleared up a bit but not at baseline. CBG shows no new hypercarbia. MR brain shows chronic meningioma with edema without midline shift.  Hospitalization has been prolonged secondary to fluid overload requiring aggressive diuresis as well as drop in hemoglobin.  Patient was transfused 1 unit PRBC.  GI was consulted assist with management.   Assessment/Plan: Acute hypoxic respiratory failure due to acute on chronic HFrEF, pleural effusions:  - initially on 2L - oxygenation improved with oxygenation - Continue to stress incentive spirometry, ambulate the patient as atelectasis is contributing. -now stable on RA, but uses oxygen for comfort -11/02/22 CTA chest--no dissection; +pleural eff, +pulmonary edema -11/02/22 CTA abd--chronic occlusion of left common iliac with reconstitution of left fem artery;  no acute intraabdominal process   Acute metabolic encephalopathy, delirium:  -Possibly element of hospital delirium and/or cocaine withdrawal.  - CT head >> no acute findings, though chronic microvascular disease and meningioma decrease threshold for confusion.  - LFT's/ammonia wnl.  - 10/28 VBG 1.28/53/<31/29 - No urinary retention noted per RN - 10/27 MR brain--slight interval growth of meningioma with progressive edema of R-temporal lobe - Keep delirium precautions and minimize sedating medications if able.  - Mentation improving after 2 days of  dexamethasone -repeat UA/culture -B12--576, folate 7.4 -TSH 3.564 -Ammonia - 14   CKD 3a -baseline creatinine 1.4-1.7 -monitor with diuresis   Acute on chronic combined HFrEF and RV failure:  - Unclear whether he was taking cardiac medications PTA, though he reports he was. Given severity of biventricular failure, cardiology consulted for advice. We will optimize volume status then discharge.  - Monitor I/O (actually turned a positive balance when PM lasix dose held), ReDS vest >40% on 10/28  - remains clinically fluid overloaded>>continue IV lasix - Given cocaine positivity, stopped beta blocker initially -10/21/22 Echo--EF 20-25%, G2DD, mild MR     Meningioma: Extra-axial brain mass known since 2023. Had mediastinal lymphadenopathy and pleural effusion sampled during that admission which did not show malignancy. Plan per neurosurgery was to biopsy brain mass if those were negative.  - Given persistent encephalopathy and cerebral edema suggested by CT, we checked MRI w/ and w/o contrast which shows increased size in meningioma with chronic cerebral edema. Dr. Jarvis Newcomer spoke with Dr. Maurice Small who initially saw this patient. He reviewed updated images and history. Recommendation is for outpatient follow up, though with small size and appearance so characteristic of meningioma, no intervention is anticipated. Given the confusion and cerebral edema, he was ok with short trial of decadron which we have pursued.    Cocaine abuse: +UDS - Cessation counseling   Acute on Chronic microcytic anemia:  -Hgb 10>>7.4 though no gross bleeding is noted. BUN up, though likely due to steroids and pt is on PPI.   - iron saturation 8%, ferritin 13 -foalte 7.4, B12 576 - Increase PPI to BID, check FOBT. Only record of GI evaluation was colonoscopy 2012 showing 2 sigmoid polyps, external hemorrhoids.  -  PROGRESS NOTE  [name] ION:[id number] DOB: 1955/03/11 DOA: 11/02/2022 PCP: Benetta Spar, MD  Brief History:   67 y.o. male with a history of CAD, tobacco use, HTN, HLD, T2DM, GERD, history of cocaine use, OSA, and combined CHF (LVEF 20-25%, G2DD) who presented to the ED 10/23 with migratory abdominal/back/pelvic pain and dyspnea. He was admitted for recurrent acute on chronic combined CHF, [place] +cocaine. IV diuresis is underway with cardiology consultation. Developed delirium 10/25, head CT nonacute. Mentation has cleared up a bit but not at baseline. CBG shows no new hypercarbia. MR brain shows chronic meningioma with edema without midline shift.  Hospitalization has been prolonged secondary to fluid overload requiring aggressive diuresis as well as drop in hemoglobin.  Patient was transfused 1 unit PRBC.  GI was consulted assist with management.   Assessment/Plan: Acute hypoxic respiratory failure due to acute on chronic HFrEF, pleural effusions:  - initially on 2L - oxygenation improved with oxygenation - Continue to stress incentive spirometry, ambulate the patient as atelectasis is contributing. -now stable on RA, but uses oxygen for comfort -11/02/22 CTA chest--no dissection; +pleural eff, +pulmonary edema -11/02/22 CTA abd--chronic occlusion of left common iliac with reconstitution of left fem artery;  no acute intraabdominal process   Acute metabolic encephalopathy, delirium:  -Possibly element of hospital delirium and/or cocaine withdrawal.  - CT head >> no acute findings, though chronic microvascular disease and meningioma decrease threshold for confusion.  - LFT's/ammonia wnl.  - 10/28 VBG 1.28/53/<31/29 - No urinary retention noted per RN - 10/27 MR brain--slight interval growth of meningioma with progressive edema of R-temporal lobe - Keep delirium precautions and minimize sedating medications if able.  - Mentation improving after 2 days of  dexamethasone -repeat UA/culture -B12--576, folate 7.4 -TSH 3.564 -Ammonia - 14   CKD 3a -baseline creatinine 1.4-1.7 -monitor with diuresis   Acute on chronic combined HFrEF and RV failure:  - Unclear whether he was taking cardiac medications PTA, though he reports he was. Given severity of biventricular failure, cardiology consulted for advice. We will optimize volume status then discharge.  - Monitor I/O (actually turned a positive balance when PM lasix dose held), ReDS vest >40% on 10/28  - remains clinically fluid overloaded>>continue IV lasix - Given cocaine positivity, stopped beta blocker initially -10/21/22 Echo--EF 20-25%, G2DD, mild MR     Meningioma: Extra-axial brain mass known since 2023. Had mediastinal lymphadenopathy and pleural effusion sampled during that admission which did not show malignancy. Plan per neurosurgery was to biopsy brain mass if those were negative.  - Given persistent encephalopathy and cerebral edema suggested by CT, we checked MRI w/ and w/o contrast which shows increased size in meningioma with chronic cerebral edema. Dr. Jarvis Newcomer spoke with Dr. Maurice Small who initially saw this patient. He reviewed updated images and history. Recommendation is for outpatient follow up, though with small size and appearance so characteristic of meningioma, no intervention is anticipated. Given the confusion and cerebral edema, he was ok with short trial of decadron which we have pursued.    Cocaine abuse: +UDS - Cessation counseling   Acute on Chronic microcytic anemia:  -Hgb 10>>7.4 though no gross bleeding is noted. BUN up, though likely due to steroids and pt is on PPI.   - iron saturation 8%, ferritin 13 -foalte 7.4, B12 576 - Increase PPI to BID, check FOBT. Only record of GI evaluation was colonoscopy 2012 showing 2 sigmoid polyps, external hemorrhoids.  -  PROGRESS NOTE  [name] ION:[id number] DOB: 1955/03/11 DOA: 11/02/2022 PCP: Benetta Spar, MD  Brief History:   67 y.o. male with a history of CAD, tobacco use, HTN, HLD, T2DM, GERD, history of cocaine use, OSA, and combined CHF (LVEF 20-25%, G2DD) who presented to the ED 10/23 with migratory abdominal/back/pelvic pain and dyspnea. He was admitted for recurrent acute on chronic combined CHF, [place] +cocaine. IV diuresis is underway with cardiology consultation. Developed delirium 10/25, head CT nonacute. Mentation has cleared up a bit but not at baseline. CBG shows no new hypercarbia. MR brain shows chronic meningioma with edema without midline shift.  Hospitalization has been prolonged secondary to fluid overload requiring aggressive diuresis as well as drop in hemoglobin.  Patient was transfused 1 unit PRBC.  GI was consulted assist with management.   Assessment/Plan: Acute hypoxic respiratory failure due to acute on chronic HFrEF, pleural effusions:  - initially on 2L - oxygenation improved with oxygenation - Continue to stress incentive spirometry, ambulate the patient as atelectasis is contributing. -now stable on RA, but uses oxygen for comfort -11/02/22 CTA chest--no dissection; +pleural eff, +pulmonary edema -11/02/22 CTA abd--chronic occlusion of left common iliac with reconstitution of left fem artery;  no acute intraabdominal process   Acute metabolic encephalopathy, delirium:  -Possibly element of hospital delirium and/or cocaine withdrawal.  - CT head >> no acute findings, though chronic microvascular disease and meningioma decrease threshold for confusion.  - LFT's/ammonia wnl.  - 10/28 VBG 1.28/53/<31/29 - No urinary retention noted per RN - 10/27 MR brain--slight interval growth of meningioma with progressive edema of R-temporal lobe - Keep delirium precautions and minimize sedating medications if able.  - Mentation improving after 2 days of  dexamethasone -repeat UA/culture -B12--576, folate 7.4 -TSH 3.564 -Ammonia - 14   CKD 3a -baseline creatinine 1.4-1.7 -monitor with diuresis   Acute on chronic combined HFrEF and RV failure:  - Unclear whether he was taking cardiac medications PTA, though he reports he was. Given severity of biventricular failure, cardiology consulted for advice. We will optimize volume status then discharge.  - Monitor I/O (actually turned a positive balance when PM lasix dose held), ReDS vest >40% on 10/28  - remains clinically fluid overloaded>>continue IV lasix - Given cocaine positivity, stopped beta blocker initially -10/21/22 Echo--EF 20-25%, G2DD, mild MR     Meningioma: Extra-axial brain mass known since 2023. Had mediastinal lymphadenopathy and pleural effusion sampled during that admission which did not show malignancy. Plan per neurosurgery was to biopsy brain mass if those were negative.  - Given persistent encephalopathy and cerebral edema suggested by CT, we checked MRI w/ and w/o contrast which shows increased size in meningioma with chronic cerebral edema. Dr. Jarvis Newcomer spoke with Dr. Maurice Small who initially saw this patient. He reviewed updated images and history. Recommendation is for outpatient follow up, though with small size and appearance so characteristic of meningioma, no intervention is anticipated. Given the confusion and cerebral edema, he was ok with short trial of decadron which we have pursued.    Cocaine abuse: +UDS - Cessation counseling   Acute on Chronic microcytic anemia:  -Hgb 10>>7.4 though no gross bleeding is noted. BUN up, though likely due to steroids and pt is on PPI.   - iron saturation 8%, ferritin 13 -foalte 7.4, B12 576 - Increase PPI to BID, check FOBT. Only record of GI evaluation was colonoscopy 2012 showing 2 sigmoid polyps, external hemorrhoids.  -  By: Agustin Cree M.D.   On: 11/02/2022 09:43   CT ABDOMEN PELVIS W CONTRAST  Result Date: 11/01/2022 CLINICAL DATA:  Abdominal pain and flank pain.  Stone suspected EXAM: CT ABDOMEN AND PELVIS WITH CONTRAST TECHNIQUE: Multidetector CT imaging of the abdomen and pelvis was performed using the standard protocol following bolus administration of intravenous contrast. RADIATION DOSE REDUCTION: This exam was performed according to the departmental dose-optimization program which includes automated exposure control, adjustment of the mA and/or kV according to patient size and/or use of iterative  reconstruction technique. CONTRAST:  OMNIPAQUE IOHEXOL 300 MG/ML  SOLN COMPARISON:  None Available. FINDINGS: Lower chest: Moderate pleural effusion Hepatobiliary: No focal hepatic lesion. Two tiny gallstones measuring less than 5 mm each. No gallbladder distension. No biliary duct dilatation. Common bile duct is normal. Pancreas: Pancreas is normal. No ductal dilatation. No pancreatic inflammation. Spleen: Normal spleen Adrenals/urinary tract: Adrenal glands normal. No nephrolithiasis or ureterolithiasis. Perinephric stranding similar comparison CT. No renal obstruction on delayed imaging. Bladder normal. Stomach/Bowel: Stomach, small-bowel and cecum are normal. The appendix is not identified but there is no pericecal inflammation to suggest appendicitis. Insert Vascular/Lymphatic: Abdominal aorta is normal caliber with atherosclerotic calcification. There is no retroperitoneal or periportal lymphadenopathy. No pelvic lymphadenopathy. Reproductive: Prostate normal Other: small volume of free fluid along the LEFT and RIGHT pericolic gutter. Musculoskeletal: No aggressive osseous lesion. IMPRESSION: 1. No nephrolithiasis or ureterolithiasis. 2. Perinephric stranding similar to comparison CT. No renal obstruction on delayed imaging. 3. Moderate pleural effusion. 4. Small volume of free fluid along the LEFT and RIGHT pericolic gutter. 5. Cholelithiasis without evidence of cholecystitis. 6.  Aortic Atherosclerosis (ICD10-I70.0). Electronically Signed   By: [name] M.D.   On: 11/01/2022 16:45   US SCROTUM W/DOPPLER  Result Date: 11/01/2022 CLINICAL DATA:  Testicular pain EXAM: SCROTAL ULTRASOUND DOPPLER ULTRASOUND OF THE TESTICLES TECHNIQUE: Complete ultrasound examination of the testicles, epididymis, and other scrotal structures was performed. Color and spectral Doppler ultrasound were also utilized to evaluate blood flow to the testicles. COMPARISON:  None Available. FINDINGS: Right testicle  Measurements: 3.3 x 1.6 x 2.8 cm. No mass or microlithiasis visualized. Left testicle Measurements: 3.4 x 2.2 x 2.2 cm. No mass or microlithiasis visualized. Right epididymis:  Normal in size and appearance. Left epididymis: Epididymal head cyst with internal echogenic debris measuring 1.1 x 1.0 x 1.0 cm. Hydrocele:  Small left hydrocele. Varicocele:  None visualized. Pulsed Doppler interrogation of both testes demonstrates normal low resistance arterial and venous waveforms bilaterally. IMPRESSION: 1. Complex left epididymal head cyst measuring up to 1.1 cm. 2. Small left hydrocele. Electronically Signed   By: [name] M.D.   On: 11/01/2022 11:46   ECHOCARDIOGRAM COMPLETE  Result Date: 10/21/2022    ECHOCARDIOGRAM REPORT   Patient Name:   [name] Date of Exam: 10/21/2022 Medical Rec #:  [id number]        Height:       69.0 in Accession #:    [id number]       Weight:       260.0 lb Date of Birth:  1955/02/11        BSA:          2.310 m Patient Age:    67 years         BP:           104/78 mmHg Patient Gender: M                HR:  By: Agustin Cree M.D.   On: 11/02/2022 09:43   CT ABDOMEN PELVIS W CONTRAST  Result Date: 11/01/2022 CLINICAL DATA:  Abdominal pain and flank pain.  Stone suspected EXAM: CT ABDOMEN AND PELVIS WITH CONTRAST TECHNIQUE: Multidetector CT imaging of the abdomen and pelvis was performed using the standard protocol following bolus administration of intravenous contrast. RADIATION DOSE REDUCTION: This exam was performed according to the departmental dose-optimization program which includes automated exposure control, adjustment of the mA and/or kV according to patient size and/or use of iterative  reconstruction technique. CONTRAST:  OMNIPAQUE IOHEXOL 300 MG/ML  SOLN COMPARISON:  None Available. FINDINGS: Lower chest: Moderate pleural effusion Hepatobiliary: No focal hepatic lesion. Two tiny gallstones measuring less than 5 mm each. No gallbladder distension. No biliary duct dilatation. Common bile duct is normal. Pancreas: Pancreas is normal. No ductal dilatation. No pancreatic inflammation. Spleen: Normal spleen Adrenals/urinary tract: Adrenal glands normal. No nephrolithiasis or ureterolithiasis. Perinephric stranding similar comparison CT. No renal obstruction on delayed imaging. Bladder normal. Stomach/Bowel: Stomach, small-bowel and cecum are normal. The appendix is not identified but there is no pericecal inflammation to suggest appendicitis. Insert Vascular/Lymphatic: Abdominal aorta is normal caliber with atherosclerotic calcification. There is no retroperitoneal or periportal lymphadenopathy. No pelvic lymphadenopathy. Reproductive: Prostate normal Other: small volume of free fluid along the LEFT and RIGHT pericolic gutter. Musculoskeletal: No aggressive osseous lesion. IMPRESSION: 1. No nephrolithiasis or ureterolithiasis. 2. Perinephric stranding similar to comparison CT. No renal obstruction on delayed imaging. 3. Moderate pleural effusion. 4. Small volume of free fluid along the LEFT and RIGHT pericolic gutter. 5. Cholelithiasis without evidence of cholecystitis. 6.  Aortic Atherosclerosis (ICD10-I70.0). Electronically Signed   By: [name] M.D.   On: 11/01/2022 16:45   US SCROTUM W/DOPPLER  Result Date: 11/01/2022 CLINICAL DATA:  Testicular pain EXAM: SCROTAL ULTRASOUND DOPPLER ULTRASOUND OF THE TESTICLES TECHNIQUE: Complete ultrasound examination of the testicles, epididymis, and other scrotal structures was performed. Color and spectral Doppler ultrasound were also utilized to evaluate blood flow to the testicles. COMPARISON:  None Available. FINDINGS: Right testicle  Measurements: 3.3 x 1.6 x 2.8 cm. No mass or microlithiasis visualized. Left testicle Measurements: 3.4 x 2.2 x 2.2 cm. No mass or microlithiasis visualized. Right epididymis:  Normal in size and appearance. Left epididymis: Epididymal head cyst with internal echogenic debris measuring 1.1 x 1.0 x 1.0 cm. Hydrocele:  Small left hydrocele. Varicocele:  None visualized. Pulsed Doppler interrogation of both testes demonstrates normal low resistance arterial and venous waveforms bilaterally. IMPRESSION: 1. Complex left epididymal head cyst measuring up to 1.1 cm. 2. Small left hydrocele. Electronically Signed   By: [name] M.D.   On: 11/01/2022 11:46   ECHOCARDIOGRAM COMPLETE  Result Date: 10/21/2022    ECHOCARDIOGRAM REPORT   Patient Name:   [name] Date of Exam: 10/21/2022 Medical Rec #:  [id number]        Height:       69.0 in Accession #:    [id number]       Weight:       260.0 lb Date of Birth:  1955/02/11        BSA:          2.310 m Patient Age:    67 years         BP:           104/78 mmHg Patient Gender: M                HR:  PROGRESS NOTE  [name] ION:[id number] DOB: 1955/03/11 DOA: 11/02/2022 PCP: Benetta Spar, MD  Brief History:   67 y.o. male with a history of CAD, tobacco use, HTN, HLD, T2DM, GERD, history of cocaine use, OSA, and combined CHF (LVEF 20-25%, G2DD) who presented to the ED 10/23 with migratory abdominal/back/pelvic pain and dyspnea. He was admitted for recurrent acute on chronic combined CHF, [place] +cocaine. IV diuresis is underway with cardiology consultation. Developed delirium 10/25, head CT nonacute. Mentation has cleared up a bit but not at baseline. CBG shows no new hypercarbia. MR brain shows chronic meningioma with edema without midline shift.  Hospitalization has been prolonged secondary to fluid overload requiring aggressive diuresis as well as drop in hemoglobin.  Patient was transfused 1 unit PRBC.  GI was consulted assist with management.   Assessment/Plan: Acute hypoxic respiratory failure due to acute on chronic HFrEF, pleural effusions:  - initially on 2L - oxygenation improved with oxygenation - Continue to stress incentive spirometry, ambulate the patient as atelectasis is contributing. -now stable on RA, but uses oxygen for comfort -11/02/22 CTA chest--no dissection; +pleural eff, +pulmonary edema -11/02/22 CTA abd--chronic occlusion of left common iliac with reconstitution of left fem artery;  no acute intraabdominal process   Acute metabolic encephalopathy, delirium:  -Possibly element of hospital delirium and/or cocaine withdrawal.  - CT head >> no acute findings, though chronic microvascular disease and meningioma decrease threshold for confusion.  - LFT's/ammonia wnl.  - 10/28 VBG 1.28/53/<31/29 - No urinary retention noted per RN - 10/27 MR brain--slight interval growth of meningioma with progressive edema of R-temporal lobe - Keep delirium precautions and minimize sedating medications if able.  - Mentation improving after 2 days of  dexamethasone -repeat UA/culture -B12--576, folate 7.4 -TSH 3.564 -Ammonia - 14   CKD 3a -baseline creatinine 1.4-1.7 -monitor with diuresis   Acute on chronic combined HFrEF and RV failure:  - Unclear whether he was taking cardiac medications PTA, though he reports he was. Given severity of biventricular failure, cardiology consulted for advice. We will optimize volume status then discharge.  - Monitor I/O (actually turned a positive balance when PM lasix dose held), ReDS vest >40% on 10/28  - remains clinically fluid overloaded>>continue IV lasix - Given cocaine positivity, stopped beta blocker initially -10/21/22 Echo--EF 20-25%, G2DD, mild MR     Meningioma: Extra-axial brain mass known since 2023. Had mediastinal lymphadenopathy and pleural effusion sampled during that admission which did not show malignancy. Plan per neurosurgery was to biopsy brain mass if those were negative.  - Given persistent encephalopathy and cerebral edema suggested by CT, we checked MRI w/ and w/o contrast which shows increased size in meningioma with chronic cerebral edema. Dr. Jarvis Newcomer spoke with Dr. Maurice Small who initially saw this patient. He reviewed updated images and history. Recommendation is for outpatient follow up, though with small size and appearance so characteristic of meningioma, no intervention is anticipated. Given the confusion and cerebral edema, he was ok with short trial of decadron which we have pursued.    Cocaine abuse: +UDS - Cessation counseling   Acute on Chronic microcytic anemia:  -Hgb 10>>7.4 though no gross bleeding is noted. BUN up, though likely due to steroids and pt is on PPI.   - iron saturation 8%, ferritin 13 -foalte 7.4, B12 576 - Increase PPI to BID, check FOBT. Only record of GI evaluation was colonoscopy 2012 showing 2 sigmoid polyps, external hemorrhoids.  -  By: Agustin Cree M.D.   On: 11/02/2022 09:43   CT ABDOMEN PELVIS W CONTRAST  Result Date: 11/01/2022 CLINICAL DATA:  Abdominal pain and flank pain.  Stone suspected EXAM: CT ABDOMEN AND PELVIS WITH CONTRAST TECHNIQUE: Multidetector CT imaging of the abdomen and pelvis was performed using the standard protocol following bolus administration of intravenous contrast. RADIATION DOSE REDUCTION: This exam was performed according to the departmental dose-optimization program which includes automated exposure control, adjustment of the mA and/or kV according to patient size and/or use of iterative  reconstruction technique. CONTRAST:  OMNIPAQUE IOHEXOL 300 MG/ML  SOLN COMPARISON:  None Available. FINDINGS: Lower chest: Moderate pleural effusion Hepatobiliary: No focal hepatic lesion. Two tiny gallstones measuring less than 5 mm each. No gallbladder distension. No biliary duct dilatation. Common bile duct is normal. Pancreas: Pancreas is normal. No ductal dilatation. No pancreatic inflammation. Spleen: Normal spleen Adrenals/urinary tract: Adrenal glands normal. No nephrolithiasis or ureterolithiasis. Perinephric stranding similar comparison CT. No renal obstruction on delayed imaging. Bladder normal. Stomach/Bowel: Stomach, small-bowel and cecum are normal. The appendix is not identified but there is no pericecal inflammation to suggest appendicitis. Insert Vascular/Lymphatic: Abdominal aorta is normal caliber with atherosclerotic calcification. There is no retroperitoneal or periportal lymphadenopathy. No pelvic lymphadenopathy. Reproductive: Prostate normal Other: small volume of free fluid along the LEFT and RIGHT pericolic gutter. Musculoskeletal: No aggressive osseous lesion. IMPRESSION: 1. No nephrolithiasis or ureterolithiasis. 2. Perinephric stranding similar to comparison CT. No renal obstruction on delayed imaging. 3. Moderate pleural effusion. 4. Small volume of free fluid along the LEFT and RIGHT pericolic gutter. 5. Cholelithiasis without evidence of cholecystitis. 6.  Aortic Atherosclerosis (ICD10-I70.0). Electronically Signed   By: [name] M.D.   On: 11/01/2022 16:45   US SCROTUM W/DOPPLER  Result Date: 11/01/2022 CLINICAL DATA:  Testicular pain EXAM: SCROTAL ULTRASOUND DOPPLER ULTRASOUND OF THE TESTICLES TECHNIQUE: Complete ultrasound examination of the testicles, epididymis, and other scrotal structures was performed. Color and spectral Doppler ultrasound were also utilized to evaluate blood flow to the testicles. COMPARISON:  None Available. FINDINGS: Right testicle  Measurements: 3.3 x 1.6 x 2.8 cm. No mass or microlithiasis visualized. Left testicle Measurements: 3.4 x 2.2 x 2.2 cm. No mass or microlithiasis visualized. Right epididymis:  Normal in size and appearance. Left epididymis: Epididymal head cyst with internal echogenic debris measuring 1.1 x 1.0 x 1.0 cm. Hydrocele:  Small left hydrocele. Varicocele:  None visualized. Pulsed Doppler interrogation of both testes demonstrates normal low resistance arterial and venous waveforms bilaterally. IMPRESSION: 1. Complex left epididymal head cyst measuring up to 1.1 cm. 2. Small left hydrocele. Electronically Signed   By: [name] M.D.   On: 11/01/2022 11:46   ECHOCARDIOGRAM COMPLETE  Result Date: 10/21/2022    ECHOCARDIOGRAM REPORT   Patient Name:   [name] Date of Exam: 10/21/2022 Medical Rec #:  [id number]        Height:       69.0 in Accession #:    [id number]       Weight:       260.0 lb Date of Birth:  1955/02/11        BSA:          2.310 m Patient Age:    67 years         BP:           104/78 mmHg Patient Gender: M                HR:  PROGRESS NOTE  [name] ION:[id number] DOB: 1955/03/11 DOA: 11/02/2022 PCP: Benetta Spar, MD  Brief History:   67 y.o. male with a history of CAD, tobacco use, HTN, HLD, T2DM, GERD, history of cocaine use, OSA, and combined CHF (LVEF 20-25%, G2DD) who presented to the ED 10/23 with migratory abdominal/back/pelvic pain and dyspnea. He was admitted for recurrent acute on chronic combined CHF, [place] +cocaine. IV diuresis is underway with cardiology consultation. Developed delirium 10/25, head CT nonacute. Mentation has cleared up a bit but not at baseline. CBG shows no new hypercarbia. MR brain shows chronic meningioma with edema without midline shift.  Hospitalization has been prolonged secondary to fluid overload requiring aggressive diuresis as well as drop in hemoglobin.  Patient was transfused 1 unit PRBC.  GI was consulted assist with management.   Assessment/Plan: Acute hypoxic respiratory failure due to acute on chronic HFrEF, pleural effusions:  - initially on 2L - oxygenation improved with oxygenation - Continue to stress incentive spirometry, ambulate the patient as atelectasis is contributing. -now stable on RA, but uses oxygen for comfort -11/02/22 CTA chest--no dissection; +pleural eff, +pulmonary edema -11/02/22 CTA abd--chronic occlusion of left common iliac with reconstitution of left fem artery;  no acute intraabdominal process   Acute metabolic encephalopathy, delirium:  -Possibly element of hospital delirium and/or cocaine withdrawal.  - CT head >> no acute findings, though chronic microvascular disease and meningioma decrease threshold for confusion.  - LFT's/ammonia wnl.  - 10/28 VBG 1.28/53/<31/29 - No urinary retention noted per RN - 10/27 MR brain--slight interval growth of meningioma with progressive edema of R-temporal lobe - Keep delirium precautions and minimize sedating medications if able.  - Mentation improving after 2 days of  dexamethasone -repeat UA/culture -B12--576, folate 7.4 -TSH 3.564 -Ammonia - 14   CKD 3a -baseline creatinine 1.4-1.7 -monitor with diuresis   Acute on chronic combined HFrEF and RV failure:  - Unclear whether he was taking cardiac medications PTA, though he reports he was. Given severity of biventricular failure, cardiology consulted for advice. We will optimize volume status then discharge.  - Monitor I/O (actually turned a positive balance when PM lasix dose held), ReDS vest >40% on 10/28  - remains clinically fluid overloaded>>continue IV lasix - Given cocaine positivity, stopped beta blocker initially -10/21/22 Echo--EF 20-25%, G2DD, mild MR     Meningioma: Extra-axial brain mass known since 2023. Had mediastinal lymphadenopathy and pleural effusion sampled during that admission which did not show malignancy. Plan per neurosurgery was to biopsy brain mass if those were negative.  - Given persistent encephalopathy and cerebral edema suggested by CT, we checked MRI w/ and w/o contrast which shows increased size in meningioma with chronic cerebral edema. Dr. Jarvis Newcomer spoke with Dr. Maurice Small who initially saw this patient. He reviewed updated images and history. Recommendation is for outpatient follow up, though with small size and appearance so characteristic of meningioma, no intervention is anticipated. Given the confusion and cerebral edema, he was ok with short trial of decadron which we have pursued.    Cocaine abuse: +UDS - Cessation counseling   Acute on Chronic microcytic anemia:  -Hgb 10>>7.4 though no gross bleeding is noted. BUN up, though likely due to steroids and pt is on PPI.   - iron saturation 8%, ferritin 13 -foalte 7.4, B12 576 - Increase PPI to BID, check FOBT. Only record of GI evaluation was colonoscopy 2012 showing 2 sigmoid polyps, external hemorrhoids.  -

## 2022-11-10 NOTE — Progress Notes (Signed)
Started drinking prep about an hour ago.  Still asking for food but understands that he can only have liquids.

## 2022-11-10 NOTE — Plan of Care (Signed)
  Problem: Education: Goal: Knowledge of disease or condition will improve Outcome: Progressing   Problem: Coping: Goal: Will verbalize positive feelings about self Outcome: Progressing Goal: Will identify appropriate support needs Outcome: Progressing   Problem: Health Behavior/Discharge Planning: Goal: Ability to manage health-related needs will improve Outcome: Progressing Goal: Goals will be collaboratively established with patient/family Outcome: Progressing   Problem: Self-Care: Goal: Ability to communicate needs accurately will improve Outcome: Progressing   Problem: Nutrition: Goal: Risk of aspiration will decrease Outcome: Progressing Goal: Dietary intake will improve Outcome: Progressing

## 2022-11-10 NOTE — Progress Notes (Signed)
Earlier patient stated to daughter by phone that doesn't feel right.  Vitals checked and all within normal limits and speech somewhat slurred but understandable.  Contacted Dr. Arbutus Leas and he evaluated patient.  Gastro started patient on clear liquid diet and to have  EGD and colonoscopy tomorrow.

## 2022-11-10 NOTE — Inpatient Diabetes Management (Signed)
Inpatient Diabetes Program Recommendations  AACE/ADA: New Consensus Statement on Inpatient Glycemic Control (2015)  Target Ranges:  Prepandial:   less than 140 mg/dL      Peak postprandial:   less than 180 mg/dL (1-2 hours)      Critically ill patients:  140 - 180 mg/dL   Lab Results  Component Value Date   GLUCAP 355 (H) 11/10/2022   HGBA1C 6.3 (H) 10/21/2022    Latest Reference Range & Units 11/09/22 12:31 11/09/22 16:38 11/09/22 21:15 11/10/22 07:30 11/10/22 11:25  Glucose-Capillary 70 - 99 mg/dL 161 (H) 096 (H) 045 (H) 220 (H) 355 (H)  (H): Data is abnormally high  Review of Glycemic Control  Diabetes history: DM2 Outpatient Diabetes medications: Jardiance 10 daily (listed as not taking) Current orders for Inpatient glycemic control: Novolog 5 units tid meal coverage, Novolog 0-15 units tid, 0-5 units hs, Decadron 4 mg daily  Inpatient Diabetes Program Recommendations:   While on Decadron, please consider: -Increase Novolog correction to 0-20 units tid, 0-5 units hs  Thank you, Darel Hong E. Kaysa Roulhac, RN, MSN, CDCES  Diabetes Coordinator Inpatient Glycemic Control Team Team Pager [phone] (8am-5pm) 11/10/2022 12:26 PM

## 2022-11-10 NOTE — Progress Notes (Addendum)
Progress Note  Patient Name: [name] Date of Encounter: 11/10/2022  Primary Cardiologist: Reatha Harps, MD  Subjective   Walked in the hallway to the nurse station return back to his room, has mild SOB and his legs became wobbly.  Otherwise doing great.  Inpatient Medications    Scheduled Meds:  aspirin EC  81 mg Oral Daily   atorvastatin  40 mg Oral QHS   cephALEXin  500 mg Oral Q8H   dexamethasone  4 mg Oral Daily   enoxaparin (LOVENOX) injection  40 mg Subcutaneous Q24H   gabapentin  300 mg Oral TID   insulin aspart  0-15 Units Subcutaneous TID WC   insulin aspart  0-5 Units Subcutaneous QHS   insulin aspart  5 Units Subcutaneous TID WC   lidocaine  1 patch Transdermal Q24H   nicotine  21 mg Transdermal Daily   pantoprazole  40 mg Oral BID   polyethylene glycol  17 g Oral BID   sodium chloride flush  3 mL Intravenous Q12H   torsemide  20 mg Oral Daily   Continuous Infusions:  PRN Meds: acetaminophen **OR** acetaminophen, hydrOXYzine, methocarbamol, nitroGLYCERIN, senna   Vital Signs    Vitals:   11/09/22 2117 11/09/22 2338 11/10/22 0358 11/10/22 0500  BP: 99/63 116/77 113/74   Pulse: 85 88 82   Resp:  18 18   Temp: 97.7 F (36.5 C) (!) 97.5 F (36.4 C) (!) 97.5 F (36.4 C)   TempSrc: Axillary Oral Oral   SpO2: 99% 97% 100%   Weight:    107.9 kg    Intake/Output Summary (Last 24 hours) at 11/10/2022 1010 Last data filed at 11/10/2022 0900 Gross per 24 hour  Intake 775 ml  Output 1150 ml  Net -375 ml    Filed Weights   11/08/22 0500 11/09/22 0500 11/10/22 0500  Weight: 107.8 kg 105 kg 107.9 kg    ECG    Not performed today, not on telemetry.  Physical Exam   GEN: No acute distress.   Neck: JVD unable to examine due to body habitus Cardiac: RRR, no murmur, rub, or gallop.  Respiratory: Nonlabored. Clear to auscultation bilaterally. GI: Soft, nontender, bowel sounds present. MS: 1-2+ pitting edema in bilateral lower EXTR; No  deformity. Neuro:  Nonfocal. Psych: Alert and oriented x 3. Normal affect.  Labs    Chemistry Recent Labs  Lab 11/05/22 1122 11/06/22 0402 11/08/22 0448 11/09/22 0503 11/10/22 0448  NA  --    < > 132* 133* 132*  K  --    < > 4.6 4.4 4.3  CL  --    < > 98 98 95*  CO2  --    < > 27 27 26   GLUCOSE  --    < > 262* 201* 266*  BUN  --    < > 38* 43* 52*  CREATININE  --    < > 1.52* 1.61* 1.69*  CALCIUM  --    < > 8.4* 8.5* 8.6*  PROT 8.0  --   --   --   --   ALBUMIN 2.8*  --   --   --   --   AST 16  --   --   --   --   ALT 14  --   --   --   --   ALKPHOS 98  --   --   --   --   BILITOT 1.2  --   --   --   --  GFRNONAA  --    < > 50* 46* 44*  ANIONGAP  --    < > 7 8 11    < > = values in this interval not displayed.     Hematology Recent Labs  Lab 11/08/22 0448 11/09/22 0503 11/10/22 0448  WBC 9.8 12.8* 16.0*  RBC 3.44*  3.50* 3.31* 3.92*  HGB 7.8* 7.4* 9.0*  HCT 26.8* 25.4* 30.4*  MCV 77.9* 76.7* 77.6*  MCH 22.7* 22.4* 23.0*  MCHC 29.1* 29.1* 29.6*  RDW 22.4* 22.3* 21.4*  PLT 252 277 304    Cardiac Enzymes Recent Labs  Lab 10/21/22 0908 10/21/22 1155 11/02/22 0822 11/02/22 1019 11/02/22 1757  TROPONINIHS 10 10 16  18* 16    BNP Recent Labs  Lab 11/08/22 0448  BNP 1,205.0*     DDimerNo results for input(s): "DDIMER" in the last 168 hours.    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 Did well, had mild SOB.  Will switch IV Lasix from 80 mg to p.o. torsemide 20 mg once daily. Reds vest is of poor quality.  Restart carvedilol upon discharge, not a candidate for SGLT2 inhibitors due to history of Fournier's gangrene.  Continue cardioprotective medications for multivessel CAD, aspirin 81 mg once daily, atorvastatin 40 mg nightly. Hb dropped from 10 to 7.4, now improved to 9 s/p 1U PRBC transfusion. No active GIB. FOBT positive. Per review of the clinic note from 08/2021, he did not want to take any medications and stopped all GDMT.  He is still unsure if he wanted to take medications after discharge. -No CLI, outpatient vascular surgery referral for management of moderate to severe LLE PAD. -Meningioma management per primary team.   Addendum Compensated from HF standpoint, no active angina. Low risk for a low risk procedure like EGD and colonoscopy.   [facility] will sign off.   Medication Recommendations: P.o. torsemide 20 mg once daily, carvedilol 3.125 mg twice daily (due to cocaine use), aspirin 81 mg once daily, atorvastatin 40 mg nightly.  Discontinue Jardiance upon discharge due to history of Fournier's gangrene. Other recommendations (labs, testing, etc): None Follow up as an outpatient: Cardiology outpatient follow-up in 2 weeks to 1 month     Signed, Cheyenne Bordeaux P Arianie Couse, MD  11/10/2022, 10:10 AM

## 2022-11-10 NOTE — H&P (View-Only) (Signed)
Gastroenterology Consult   Referring Provider: No ref. provider found Primary Care Physician:  Benetta Spar, MD Primary Gastroenterologist: unassigned (Dr. Tasia Catchings)  Patient ID: [name]; [id number]; 12/31/1955   Admit date: 11/02/2022  LOS: 7 days   Date of Consultation: 11/10/2022  Reason for Consultation:  IDA    History of Present Illness   [name] is a 67 y.o. male with past medical history significant for CAD status post stenting, peripheral vascular disease status post stenting, CKD, cocaine abuse current positive urine drug screen, diabetes, hypertension, GERD, Fournier gangrene, hyperlipidemia, ischemic cardiomyopathy, left ventricular mural thrombus, sleep apnea, meningioma who presented to the ED on October 23 with migratory abdominal pain/back/pelvic pain and dyspnea.  Recent admission, discharged on October 15 for acute CHF with gradual increasing pain in the scrotum, swelling of the scrotum, leg edema.  Patient admitted with acute hypoxic respiratory failure due to acute on chronic HFrEF.  Developed delirium October 25, head CT nonacute.  MRI brain showed chronic meningioma with edema without midline shift.  Hospitalization prolonged secondary to fluid overload requiring aggressive diuresis.  GI now being consulted for iron deficiency anemia.  On admission patient's hemoglobin was 9.3, had been 9.1 two weeks prior.  Hemoglobin had dropped down to 7.4, MCV 76.7.  Patient received 1 unit of blood and hemoglobin 9.0 today.  Ferritin 13, iron 32, iron sats 8%, TIBC 420, B12 576, folate 7.4.  Sed rate 80.  CRP 1.3.  Urine drug screen positive for cocaine.  BUN 52, creatinine 1.69, sodium 132, albumin 2.8, total bilirubin 1.2, alk phos 98, AST 16, ALT 14.  Patient denies melena. Rarely has brbpr. BM 1-2 times per week. No heartburn. Had some abdominal pain but gone now, improved after blood transfusion. N/v earlier in admission but now his appetite is much  better. Hungry now. Still with some DOE and legs feel wobbly when walking to nursing station. Was able to get up and bathe himself today. Denies ASA use. Takes occasional Advil for back pain. Patient states last cocaine use in 09/2022.   Colonoscopy 2008: 2 small hyperplastic polyps removed.  External hemorrhoids.   Prior to Admission medications   Medication Sig Start Date End Date Taking? Authorizing Provider  furosemide (LASIX) 40 MG tablet Take 1 tablet (40 mg total) by mouth daily. 10/25/22 11/24/22 Yes Vassie Loll, MD  gabapentin (NEURONTIN) 300 MG capsule Take 1 capsule (300 mg total) by mouth 3 (three) times daily. 10/25/22  Yes Vassie Loll, MD  nicotine (NICODERM CQ - DOSED IN MG/24 HOURS) 21 mg/24hr patch Place 1 patch (21 mg total) onto the skin daily. 10/26/22  Yes Vassie Loll, MD  atorvastatin (LIPITOR) 40 MG tablet Take 1 tablet (40 mg total) by mouth at bedtime. Patient not taking: Reported on 11/02/2022 10/25/22 11/24/22  Vassie Loll, MD  carvedilol (COREG) 3.125 MG tablet Take 1 tablet (3.125 mg total) by mouth 2 (two) times daily with a meal. Patient not taking: Reported on 11/02/2022 10/25/22 11/24/22  Vassie Loll, MD  empagliflozin (JARDIANCE) 10 MG TABS tablet Take 1 tablet (10 mg total) by mouth daily. Patient not taking: Reported on 11/02/2022 10/26/22   Vassie Loll, MD  pantoprazole (PROTONIX) 40 MG tablet Take 1 tablet (40 mg total) by mouth daily. Patient not taking: Reported on 11/02/2022 10/26/22   Vassie Loll, MD  sildenafil (VIAGRA) 50 MG tablet Take 50 mg by mouth daily as needed for erectile dysfunction. Patient not taking: Reported on 11/02/2022 08/26/22  Gastroenterology Consult   Referring Provider: No ref. provider found Primary Care Physician:  Benetta Spar, MD Primary Gastroenterologist: unassigned (Dr. Tasia Catchings)  Patient ID: [name]; [id number]; 12/31/1955   Admit date: 11/02/2022  LOS: 7 days   Date of Consultation: 11/10/2022  Reason for Consultation:  IDA    History of Present Illness   [name] is a 67 y.o. male with past medical history significant for CAD status post stenting, peripheral vascular disease status post stenting, CKD, cocaine abuse current positive urine drug screen, diabetes, hypertension, GERD, Fournier gangrene, hyperlipidemia, ischemic cardiomyopathy, left ventricular mural thrombus, sleep apnea, meningioma who presented to the ED on October 23 with migratory abdominal pain/back/pelvic pain and dyspnea.  Recent admission, discharged on October 15 for acute CHF with gradual increasing pain in the scrotum, swelling of the scrotum, leg edema.  Patient admitted with acute hypoxic respiratory failure due to acute on chronic HFrEF.  Developed delirium October 25, head CT nonacute.  MRI brain showed chronic meningioma with edema without midline shift.  Hospitalization prolonged secondary to fluid overload requiring aggressive diuresis.  GI now being consulted for iron deficiency anemia.  On admission patient's hemoglobin was 9.3, had been 9.1 two weeks prior.  Hemoglobin had dropped down to 7.4, MCV 76.7.  Patient received 1 unit of blood and hemoglobin 9.0 today.  Ferritin 13, iron 32, iron sats 8%, TIBC 420, B12 576, folate 7.4.  Sed rate 80.  CRP 1.3.  Urine drug screen positive for cocaine.  BUN 52, creatinine 1.69, sodium 132, albumin 2.8, total bilirubin 1.2, alk phos 98, AST 16, ALT 14.  Patient denies melena. Rarely has brbpr. BM 1-2 times per week. No heartburn. Had some abdominal pain but gone now, improved after blood transfusion. N/v earlier in admission but now his appetite is much  better. Hungry now. Still with some DOE and legs feel wobbly when walking to nursing station. Was able to get up and bathe himself today. Denies ASA use. Takes occasional Advil for back pain. Patient states last cocaine use in 09/2022.   Colonoscopy 2008: 2 small hyperplastic polyps removed.  External hemorrhoids.   Prior to Admission medications   Medication Sig Start Date End Date Taking? Authorizing Provider  furosemide (LASIX) 40 MG tablet Take 1 tablet (40 mg total) by mouth daily. 10/25/22 11/24/22 Yes Vassie Loll, MD  gabapentin (NEURONTIN) 300 MG capsule Take 1 capsule (300 mg total) by mouth 3 (three) times daily. 10/25/22  Yes Vassie Loll, MD  nicotine (NICODERM CQ - DOSED IN MG/24 HOURS) 21 mg/24hr patch Place 1 patch (21 mg total) onto the skin daily. 10/26/22  Yes Vassie Loll, MD  atorvastatin (LIPITOR) 40 MG tablet Take 1 tablet (40 mg total) by mouth at bedtime. Patient not taking: Reported on 11/02/2022 10/25/22 11/24/22  Vassie Loll, MD  carvedilol (COREG) 3.125 MG tablet Take 1 tablet (3.125 mg total) by mouth 2 (two) times daily with a meal. Patient not taking: Reported on 11/02/2022 10/25/22 11/24/22  Vassie Loll, MD  empagliflozin (JARDIANCE) 10 MG TABS tablet Take 1 tablet (10 mg total) by mouth daily. Patient not taking: Reported on 11/02/2022 10/26/22   Vassie Loll, MD  pantoprazole (PROTONIX) 40 MG tablet Take 1 tablet (40 mg total) by mouth daily. Patient not taking: Reported on 11/02/2022 10/26/22   Vassie Loll, MD  sildenafil (VIAGRA) 50 MG tablet Take 50 mg by mouth daily as needed for erectile dysfunction. Patient not taking: Reported on 11/02/2022 08/26/22  Gastroenterology Consult   Referring Provider: No ref. provider found Primary Care Physician:  Benetta Spar, MD Primary Gastroenterologist: unassigned (Dr. Tasia Catchings)  Patient ID: [name]; [id number]; 12/31/1955   Admit date: 11/02/2022  LOS: 7 days   Date of Consultation: 11/10/2022  Reason for Consultation:  IDA    History of Present Illness   [name] is a 67 y.o. male with past medical history significant for CAD status post stenting, peripheral vascular disease status post stenting, CKD, cocaine abuse current positive urine drug screen, diabetes, hypertension, GERD, Fournier gangrene, hyperlipidemia, ischemic cardiomyopathy, left ventricular mural thrombus, sleep apnea, meningioma who presented to the ED on October 23 with migratory abdominal pain/back/pelvic pain and dyspnea.  Recent admission, discharged on October 15 for acute CHF with gradual increasing pain in the scrotum, swelling of the scrotum, leg edema.  Patient admitted with acute hypoxic respiratory failure due to acute on chronic HFrEF.  Developed delirium October 25, head CT nonacute.  MRI brain showed chronic meningioma with edema without midline shift.  Hospitalization prolonged secondary to fluid overload requiring aggressive diuresis.  GI now being consulted for iron deficiency anemia.  On admission patient's hemoglobin was 9.3, had been 9.1 two weeks prior.  Hemoglobin had dropped down to 7.4, MCV 76.7.  Patient received 1 unit of blood and hemoglobin 9.0 today.  Ferritin 13, iron 32, iron sats 8%, TIBC 420, B12 576, folate 7.4.  Sed rate 80.  CRP 1.3.  Urine drug screen positive for cocaine.  BUN 52, creatinine 1.69, sodium 132, albumin 2.8, total bilirubin 1.2, alk phos 98, AST 16, ALT 14.  Patient denies melena. Rarely has brbpr. BM 1-2 times per week. No heartburn. Had some abdominal pain but gone now, improved after blood transfusion. N/v earlier in admission but now his appetite is much  better. Hungry now. Still with some DOE and legs feel wobbly when walking to nursing station. Was able to get up and bathe himself today. Denies ASA use. Takes occasional Advil for back pain. Patient states last cocaine use in 09/2022.   Colonoscopy 2008: 2 small hyperplastic polyps removed.  External hemorrhoids.   Prior to Admission medications   Medication Sig Start Date End Date Taking? Authorizing Provider  furosemide (LASIX) 40 MG tablet Take 1 tablet (40 mg total) by mouth daily. 10/25/22 11/24/22 Yes Vassie Loll, MD  gabapentin (NEURONTIN) 300 MG capsule Take 1 capsule (300 mg total) by mouth 3 (three) times daily. 10/25/22  Yes Vassie Loll, MD  nicotine (NICODERM CQ - DOSED IN MG/24 HOURS) 21 mg/24hr patch Place 1 patch (21 mg total) onto the skin daily. 10/26/22  Yes Vassie Loll, MD  atorvastatin (LIPITOR) 40 MG tablet Take 1 tablet (40 mg total) by mouth at bedtime. Patient not taking: Reported on 11/02/2022 10/25/22 11/24/22  Vassie Loll, MD  carvedilol (COREG) 3.125 MG tablet Take 1 tablet (3.125 mg total) by mouth 2 (two) times daily with a meal. Patient not taking: Reported on 11/02/2022 10/25/22 11/24/22  Vassie Loll, MD  empagliflozin (JARDIANCE) 10 MG TABS tablet Take 1 tablet (10 mg total) by mouth daily. Patient not taking: Reported on 11/02/2022 10/26/22   Vassie Loll, MD  pantoprazole (PROTONIX) 40 MG tablet Take 1 tablet (40 mg total) by mouth daily. Patient not taking: Reported on 11/02/2022 10/26/22   Vassie Loll, MD  sildenafil (VIAGRA) 50 MG tablet Take 50 mg by mouth daily as needed for erectile dysfunction. Patient not taking: Reported on 11/02/2022 08/26/22  and pelvis was performed using the  standard protocol following bolus administration of intravenous contrast. RADIATION DOSE REDUCTION: This exam was performed according to the departmental dose-optimization program which includes automated exposure control, adjustment of the mA and/or kV according to patient size and/or use of iterative reconstruction technique. CONTRAST:  OMNIPAQUE IOHEXOL 300 MG/ML  SOLN COMPARISON:  None Available. FINDINGS: Lower chest: Moderate pleural effusion Hepatobiliary: No focal hepatic lesion. Two tiny gallstones measuring less than 5 mm each. No gallbladder distension. No biliary duct dilatation. Common bile duct is normal. Pancreas: Pancreas is normal. No ductal dilatation. No pancreatic inflammation. Spleen: Normal spleen Adrenals/urinary tract: Adrenal glands normal. No nephrolithiasis or ureterolithiasis. Perinephric stranding similar comparison CT. No renal obstruction on delayed imaging. Bladder normal. Stomach/Bowel: Stomach, small-bowel and cecum are normal. The appendix is not identified but there is no pericecal inflammation to suggest appendicitis. Insert Vascular/Lymphatic: Abdominal aorta is normal caliber with atherosclerotic calcification. There is no retroperitoneal or periportal lymphadenopathy. No pelvic lymphadenopathy. Reproductive: Prostate normal Other: small volume of free fluid along the LEFT and RIGHT pericolic gutter. Musculoskeletal: No aggressive osseous lesion. IMPRESSION: 1. No nephrolithiasis or ureterolithiasis. 2. Perinephric stranding similar to comparison CT. No renal obstruction on delayed imaging. 3. Moderate pleural effusion. 4. Small volume of free fluid along the LEFT and RIGHT pericolic gutter. 5. Cholelithiasis without evidence of cholecystitis. 6.  Aortic Atherosclerosis (ICD10-I70.0). Electronically Signed   By: Genevive Bi M.D.   On: 11/01/2022 16:45   US SCROTUM W/DOPPLER  Result Date: 11/01/2022 CLINICAL DATA:  Testicular pain EXAM: SCROTAL ULTRASOUND DOPPLER  ULTRASOUND OF THE TESTICLES TECHNIQUE: Complete ultrasound examination of the testicles, epididymis, and other scrotal structures was performed. Color and spectral Doppler ultrasound were also utilized to evaluate blood flow to the testicles. COMPARISON:  None Available. FINDINGS: Right testicle Measurements: 3.3 x 1.6 x 2.8 cm. No mass or microlithiasis visualized. Left testicle Measurements: 3.4 x 2.2 x 2.2 cm. No mass or microlithiasis visualized. Right epididymis:  Normal in size and appearance. Left epididymis: Epididymal head cyst with internal echogenic debris measuring 1.1 x 1.0 x 1.0 cm. Hydrocele:  Small left hydrocele. Varicocele:  None visualized. Pulsed Doppler interrogation of both testes demonstrates normal low resistance arterial and venous waveforms bilaterally. IMPRESSION: 1. Complex left epididymal head cyst measuring up to 1.1 cm. 2. Small left hydrocele. Electronically Signed   By: Allegra Lai M.D.   On: 11/01/2022 11:46   ECHOCARDIOGRAM COMPLETE  Result Date: 10/21/2022    ECHOCARDIOGRAM REPORT   Patient Name:   [name] Date of Exam: 10/21/2022 Medical Rec #:  [id number]        Height:       69.0 in Accession #:    [id number]       Weight:       260.0 lb Date of Birth:  Apr 23, 1955        BSA:          2.310 m Patient Age:    67 years         BP:           104/78 mmHg Patient Gender: M                HR:           74 bpm. [facility] Procedure: 2D Echo, Cardiac Doppler and Color Doppler Indications:    CHF- Acute Systolic I50.21  History:        Patient has prior history  and pelvis was performed using the  standard protocol following bolus administration of intravenous contrast. RADIATION DOSE REDUCTION: This exam was performed according to the departmental dose-optimization program which includes automated exposure control, adjustment of the mA and/or kV according to patient size and/or use of iterative reconstruction technique. CONTRAST:  OMNIPAQUE IOHEXOL 300 MG/ML  SOLN COMPARISON:  None Available. FINDINGS: Lower chest: Moderate pleural effusion Hepatobiliary: No focal hepatic lesion. Two tiny gallstones measuring less than 5 mm each. No gallbladder distension. No biliary duct dilatation. Common bile duct is normal. Pancreas: Pancreas is normal. No ductal dilatation. No pancreatic inflammation. Spleen: Normal spleen Adrenals/urinary tract: Adrenal glands normal. No nephrolithiasis or ureterolithiasis. Perinephric stranding similar comparison CT. No renal obstruction on delayed imaging. Bladder normal. Stomach/Bowel: Stomach, small-bowel and cecum are normal. The appendix is not identified but there is no pericecal inflammation to suggest appendicitis. Insert Vascular/Lymphatic: Abdominal aorta is normal caliber with atherosclerotic calcification. There is no retroperitoneal or periportal lymphadenopathy. No pelvic lymphadenopathy. Reproductive: Prostate normal Other: small volume of free fluid along the LEFT and RIGHT pericolic gutter. Musculoskeletal: No aggressive osseous lesion. IMPRESSION: 1. No nephrolithiasis or ureterolithiasis. 2. Perinephric stranding similar to comparison CT. No renal obstruction on delayed imaging. 3. Moderate pleural effusion. 4. Small volume of free fluid along the LEFT and RIGHT pericolic gutter. 5. Cholelithiasis without evidence of cholecystitis. 6.  Aortic Atherosclerosis (ICD10-I70.0). Electronically Signed   By: Genevive Bi M.D.   On: 11/01/2022 16:45   US SCROTUM W/DOPPLER  Result Date: 11/01/2022 CLINICAL DATA:  Testicular pain EXAM: SCROTAL ULTRASOUND DOPPLER  ULTRASOUND OF THE TESTICLES TECHNIQUE: Complete ultrasound examination of the testicles, epididymis, and other scrotal structures was performed. Color and spectral Doppler ultrasound were also utilized to evaluate blood flow to the testicles. COMPARISON:  None Available. FINDINGS: Right testicle Measurements: 3.3 x 1.6 x 2.8 cm. No mass or microlithiasis visualized. Left testicle Measurements: 3.4 x 2.2 x 2.2 cm. No mass or microlithiasis visualized. Right epididymis:  Normal in size and appearance. Left epididymis: Epididymal head cyst with internal echogenic debris measuring 1.1 x 1.0 x 1.0 cm. Hydrocele:  Small left hydrocele. Varicocele:  None visualized. Pulsed Doppler interrogation of both testes demonstrates normal low resistance arterial and venous waveforms bilaterally. IMPRESSION: 1. Complex left epididymal head cyst measuring up to 1.1 cm. 2. Small left hydrocele. Electronically Signed   By: Allegra Lai M.D.   On: 11/01/2022 11:46   ECHOCARDIOGRAM COMPLETE  Result Date: 10/21/2022    ECHOCARDIOGRAM REPORT   Patient Name:   [name] Date of Exam: 10/21/2022 Medical Rec #:  [id number]        Height:       69.0 in Accession #:    [id number]       Weight:       260.0 lb Date of Birth:  Apr 23, 1955        BSA:          2.310 m Patient Age:    67 years         BP:           104/78 mmHg Patient Gender: M                HR:           74 bpm. [facility] Procedure: 2D Echo, Cardiac Doppler and Color Doppler Indications:    CHF- Acute Systolic I50.21  History:        Patient has prior history  Gastroenterology Consult   Referring Provider: No ref. provider found Primary Care Physician:  Benetta Spar, MD Primary Gastroenterologist: unassigned (Dr. Tasia Catchings)  Patient ID: [name]; [id number]; 12/31/1955   Admit date: 11/02/2022  LOS: 7 days   Date of Consultation: 11/10/2022  Reason for Consultation:  IDA    History of Present Illness   [name] is a 67 y.o. male with past medical history significant for CAD status post stenting, peripheral vascular disease status post stenting, CKD, cocaine abuse current positive urine drug screen, diabetes, hypertension, GERD, Fournier gangrene, hyperlipidemia, ischemic cardiomyopathy, left ventricular mural thrombus, sleep apnea, meningioma who presented to the ED on October 23 with migratory abdominal pain/back/pelvic pain and dyspnea.  Recent admission, discharged on October 15 for acute CHF with gradual increasing pain in the scrotum, swelling of the scrotum, leg edema.  Patient admitted with acute hypoxic respiratory failure due to acute on chronic HFrEF.  Developed delirium October 25, head CT nonacute.  MRI brain showed chronic meningioma with edema without midline shift.  Hospitalization prolonged secondary to fluid overload requiring aggressive diuresis.  GI now being consulted for iron deficiency anemia.  On admission patient's hemoglobin was 9.3, had been 9.1 two weeks prior.  Hemoglobin had dropped down to 7.4, MCV 76.7.  Patient received 1 unit of blood and hemoglobin 9.0 today.  Ferritin 13, iron 32, iron sats 8%, TIBC 420, B12 576, folate 7.4.  Sed rate 80.  CRP 1.3.  Urine drug screen positive for cocaine.  BUN 52, creatinine 1.69, sodium 132, albumin 2.8, total bilirubin 1.2, alk phos 98, AST 16, ALT 14.  Patient denies melena. Rarely has brbpr. BM 1-2 times per week. No heartburn. Had some abdominal pain but gone now, improved after blood transfusion. N/v earlier in admission but now his appetite is much  better. Hungry now. Still with some DOE and legs feel wobbly when walking to nursing station. Was able to get up and bathe himself today. Denies ASA use. Takes occasional Advil for back pain. Patient states last cocaine use in 09/2022.   Colonoscopy 2008: 2 small hyperplastic polyps removed.  External hemorrhoids.   Prior to Admission medications   Medication Sig Start Date End Date Taking? Authorizing Provider  furosemide (LASIX) 40 MG tablet Take 1 tablet (40 mg total) by mouth daily. 10/25/22 11/24/22 Yes Vassie Loll, MD  gabapentin (NEURONTIN) 300 MG capsule Take 1 capsule (300 mg total) by mouth 3 (three) times daily. 10/25/22  Yes Vassie Loll, MD  nicotine (NICODERM CQ - DOSED IN MG/24 HOURS) 21 mg/24hr patch Place 1 patch (21 mg total) onto the skin daily. 10/26/22  Yes Vassie Loll, MD  atorvastatin (LIPITOR) 40 MG tablet Take 1 tablet (40 mg total) by mouth at bedtime. Patient not taking: Reported on 11/02/2022 10/25/22 11/24/22  Vassie Loll, MD  carvedilol (COREG) 3.125 MG tablet Take 1 tablet (3.125 mg total) by mouth 2 (two) times daily with a meal. Patient not taking: Reported on 11/02/2022 10/25/22 11/24/22  Vassie Loll, MD  empagliflozin (JARDIANCE) 10 MG TABS tablet Take 1 tablet (10 mg total) by mouth daily. Patient not taking: Reported on 11/02/2022 10/26/22   Vassie Loll, MD  pantoprazole (PROTONIX) 40 MG tablet Take 1 tablet (40 mg total) by mouth daily. Patient not taking: Reported on 11/02/2022 10/26/22   Vassie Loll, MD  sildenafil (VIAGRA) 50 MG tablet Take 50 mg by mouth daily as needed for erectile dysfunction. Patient not taking: Reported on 11/02/2022 08/26/22  Gastroenterology Consult   Referring Provider: No ref. provider found Primary Care Physician:  Benetta Spar, MD Primary Gastroenterologist: unassigned (Dr. Tasia Catchings)  Patient ID: [name]; [id number]; 12/31/1955   Admit date: 11/02/2022  LOS: 7 days   Date of Consultation: 11/10/2022  Reason for Consultation:  IDA    History of Present Illness   [name] is a 67 y.o. male with past medical history significant for CAD status post stenting, peripheral vascular disease status post stenting, CKD, cocaine abuse current positive urine drug screen, diabetes, hypertension, GERD, Fournier gangrene, hyperlipidemia, ischemic cardiomyopathy, left ventricular mural thrombus, sleep apnea, meningioma who presented to the ED on October 23 with migratory abdominal pain/back/pelvic pain and dyspnea.  Recent admission, discharged on October 15 for acute CHF with gradual increasing pain in the scrotum, swelling of the scrotum, leg edema.  Patient admitted with acute hypoxic respiratory failure due to acute on chronic HFrEF.  Developed delirium October 25, head CT nonacute.  MRI brain showed chronic meningioma with edema without midline shift.  Hospitalization prolonged secondary to fluid overload requiring aggressive diuresis.  GI now being consulted for iron deficiency anemia.  On admission patient's hemoglobin was 9.3, had been 9.1 two weeks prior.  Hemoglobin had dropped down to 7.4, MCV 76.7.  Patient received 1 unit of blood and hemoglobin 9.0 today.  Ferritin 13, iron 32, iron sats 8%, TIBC 420, B12 576, folate 7.4.  Sed rate 80.  CRP 1.3.  Urine drug screen positive for cocaine.  BUN 52, creatinine 1.69, sodium 132, albumin 2.8, total bilirubin 1.2, alk phos 98, AST 16, ALT 14.  Patient denies melena. Rarely has brbpr. BM 1-2 times per week. No heartburn. Had some abdominal pain but gone now, improved after blood transfusion. N/v earlier in admission but now his appetite is much  better. Hungry now. Still with some DOE and legs feel wobbly when walking to nursing station. Was able to get up and bathe himself today. Denies ASA use. Takes occasional Advil for back pain. Patient states last cocaine use in 09/2022.   Colonoscopy 2008: 2 small hyperplastic polyps removed.  External hemorrhoids.   Prior to Admission medications   Medication Sig Start Date End Date Taking? Authorizing Provider  furosemide (LASIX) 40 MG tablet Take 1 tablet (40 mg total) by mouth daily. 10/25/22 11/24/22 Yes Vassie Loll, MD  gabapentin (NEURONTIN) 300 MG capsule Take 1 capsule (300 mg total) by mouth 3 (three) times daily. 10/25/22  Yes Vassie Loll, MD  nicotine (NICODERM CQ - DOSED IN MG/24 HOURS) 21 mg/24hr patch Place 1 patch (21 mg total) onto the skin daily. 10/26/22  Yes Vassie Loll, MD  atorvastatin (LIPITOR) 40 MG tablet Take 1 tablet (40 mg total) by mouth at bedtime. Patient not taking: Reported on 11/02/2022 10/25/22 11/24/22  Vassie Loll, MD  carvedilol (COREG) 3.125 MG tablet Take 1 tablet (3.125 mg total) by mouth 2 (two) times daily with a meal. Patient not taking: Reported on 11/02/2022 10/25/22 11/24/22  Vassie Loll, MD  empagliflozin (JARDIANCE) 10 MG TABS tablet Take 1 tablet (10 mg total) by mouth daily. Patient not taking: Reported on 11/02/2022 10/26/22   Vassie Loll, MD  pantoprazole (PROTONIX) 40 MG tablet Take 1 tablet (40 mg total) by mouth daily. Patient not taking: Reported on 11/02/2022 10/26/22   Vassie Loll, MD  sildenafil (VIAGRA) 50 MG tablet Take 50 mg by mouth daily as needed for erectile dysfunction. Patient not taking: Reported on 11/02/2022 08/26/22  Gastroenterology Consult   Referring Provider: No ref. provider found Primary Care Physician:  Benetta Spar, MD Primary Gastroenterologist: unassigned (Dr. Tasia Catchings)  Patient ID: [name]; [id number]; 12/31/1955   Admit date: 11/02/2022  LOS: 7 days   Date of Consultation: 11/10/2022  Reason for Consultation:  IDA    History of Present Illness   [name] is a 67 y.o. male with past medical history significant for CAD status post stenting, peripheral vascular disease status post stenting, CKD, cocaine abuse current positive urine drug screen, diabetes, hypertension, GERD, Fournier gangrene, hyperlipidemia, ischemic cardiomyopathy, left ventricular mural thrombus, sleep apnea, meningioma who presented to the ED on October 23 with migratory abdominal pain/back/pelvic pain and dyspnea.  Recent admission, discharged on October 15 for acute CHF with gradual increasing pain in the scrotum, swelling of the scrotum, leg edema.  Patient admitted with acute hypoxic respiratory failure due to acute on chronic HFrEF.  Developed delirium October 25, head CT nonacute.  MRI brain showed chronic meningioma with edema without midline shift.  Hospitalization prolonged secondary to fluid overload requiring aggressive diuresis.  GI now being consulted for iron deficiency anemia.  On admission patient's hemoglobin was 9.3, had been 9.1 two weeks prior.  Hemoglobin had dropped down to 7.4, MCV 76.7.  Patient received 1 unit of blood and hemoglobin 9.0 today.  Ferritin 13, iron 32, iron sats 8%, TIBC 420, B12 576, folate 7.4.  Sed rate 80.  CRP 1.3.  Urine drug screen positive for cocaine.  BUN 52, creatinine 1.69, sodium 132, albumin 2.8, total bilirubin 1.2, alk phos 98, AST 16, ALT 14.  Patient denies melena. Rarely has brbpr. BM 1-2 times per week. No heartburn. Had some abdominal pain but gone now, improved after blood transfusion. N/v earlier in admission but now his appetite is much  better. Hungry now. Still with some DOE and legs feel wobbly when walking to nursing station. Was able to get up and bathe himself today. Denies ASA use. Takes occasional Advil for back pain. Patient states last cocaine use in 09/2022.   Colonoscopy 2008: 2 small hyperplastic polyps removed.  External hemorrhoids.   Prior to Admission medications   Medication Sig Start Date End Date Taking? Authorizing Provider  furosemide (LASIX) 40 MG tablet Take 1 tablet (40 mg total) by mouth daily. 10/25/22 11/24/22 Yes Vassie Loll, MD  gabapentin (NEURONTIN) 300 MG capsule Take 1 capsule (300 mg total) by mouth 3 (three) times daily. 10/25/22  Yes Vassie Loll, MD  nicotine (NICODERM CQ - DOSED IN MG/24 HOURS) 21 mg/24hr patch Place 1 patch (21 mg total) onto the skin daily. 10/26/22  Yes Vassie Loll, MD  atorvastatin (LIPITOR) 40 MG tablet Take 1 tablet (40 mg total) by mouth at bedtime. Patient not taking: Reported on 11/02/2022 10/25/22 11/24/22  Vassie Loll, MD  carvedilol (COREG) 3.125 MG tablet Take 1 tablet (3.125 mg total) by mouth 2 (two) times daily with a meal. Patient not taking: Reported on 11/02/2022 10/25/22 11/24/22  Vassie Loll, MD  empagliflozin (JARDIANCE) 10 MG TABS tablet Take 1 tablet (10 mg total) by mouth daily. Patient not taking: Reported on 11/02/2022 10/26/22   Vassie Loll, MD  pantoprazole (PROTONIX) 40 MG tablet Take 1 tablet (40 mg total) by mouth daily. Patient not taking: Reported on 11/02/2022 10/26/22   Vassie Loll, MD  sildenafil (VIAGRA) 50 MG tablet Take 50 mg by mouth daily as needed for erectile dysfunction. Patient not taking: Reported on 11/02/2022 08/26/22  and pelvis was performed using the  standard protocol following bolus administration of intravenous contrast. RADIATION DOSE REDUCTION: This exam was performed according to the departmental dose-optimization program which includes automated exposure control, adjustment of the mA and/or kV according to patient size and/or use of iterative reconstruction technique. CONTRAST:  OMNIPAQUE IOHEXOL 300 MG/ML  SOLN COMPARISON:  None Available. FINDINGS: Lower chest: Moderate pleural effusion Hepatobiliary: No focal hepatic lesion. Two tiny gallstones measuring less than 5 mm each. No gallbladder distension. No biliary duct dilatation. Common bile duct is normal. Pancreas: Pancreas is normal. No ductal dilatation. No pancreatic inflammation. Spleen: Normal spleen Adrenals/urinary tract: Adrenal glands normal. No nephrolithiasis or ureterolithiasis. Perinephric stranding similar comparison CT. No renal obstruction on delayed imaging. Bladder normal. Stomach/Bowel: Stomach, small-bowel and cecum are normal. The appendix is not identified but there is no pericecal inflammation to suggest appendicitis. Insert Vascular/Lymphatic: Abdominal aorta is normal caliber with atherosclerotic calcification. There is no retroperitoneal or periportal lymphadenopathy. No pelvic lymphadenopathy. Reproductive: Prostate normal Other: small volume of free fluid along the LEFT and RIGHT pericolic gutter. Musculoskeletal: No aggressive osseous lesion. IMPRESSION: 1. No nephrolithiasis or ureterolithiasis. 2. Perinephric stranding similar to comparison CT. No renal obstruction on delayed imaging. 3. Moderate pleural effusion. 4. Small volume of free fluid along the LEFT and RIGHT pericolic gutter. 5. Cholelithiasis without evidence of cholecystitis. 6.  Aortic Atherosclerosis (ICD10-I70.0). Electronically Signed   By: Genevive Bi M.D.   On: 11/01/2022 16:45   US SCROTUM W/DOPPLER  Result Date: 11/01/2022 CLINICAL DATA:  Testicular pain EXAM: SCROTAL ULTRASOUND DOPPLER  ULTRASOUND OF THE TESTICLES TECHNIQUE: Complete ultrasound examination of the testicles, epididymis, and other scrotal structures was performed. Color and spectral Doppler ultrasound were also utilized to evaluate blood flow to the testicles. COMPARISON:  None Available. FINDINGS: Right testicle Measurements: 3.3 x 1.6 x 2.8 cm. No mass or microlithiasis visualized. Left testicle Measurements: 3.4 x 2.2 x 2.2 cm. No mass or microlithiasis visualized. Right epididymis:  Normal in size and appearance. Left epididymis: Epididymal head cyst with internal echogenic debris measuring 1.1 x 1.0 x 1.0 cm. Hydrocele:  Small left hydrocele. Varicocele:  None visualized. Pulsed Doppler interrogation of both testes demonstrates normal low resistance arterial and venous waveforms bilaterally. IMPRESSION: 1. Complex left epididymal head cyst measuring up to 1.1 cm. 2. Small left hydrocele. Electronically Signed   By: Allegra Lai M.D.   On: 11/01/2022 11:46   ECHOCARDIOGRAM COMPLETE  Result Date: 10/21/2022    ECHOCARDIOGRAM REPORT   Patient Name:   [name] Date of Exam: 10/21/2022 Medical Rec #:  [id number]        Height:       69.0 in Accession #:    [id number]       Weight:       260.0 lb Date of Birth:  Apr 23, 1955        BSA:          2.310 m Patient Age:    67 years         BP:           104/78 mmHg Patient Gender: M                HR:           74 bpm. [facility] Procedure: 2D Echo, Cardiac Doppler and Color Doppler Indications:    CHF- Acute Systolic I50.21  History:        Patient has prior history  and pelvis was performed using the  standard protocol following bolus administration of intravenous contrast. RADIATION DOSE REDUCTION: This exam was performed according to the departmental dose-optimization program which includes automated exposure control, adjustment of the mA and/or kV according to patient size and/or use of iterative reconstruction technique. CONTRAST:  OMNIPAQUE IOHEXOL 300 MG/ML  SOLN COMPARISON:  None Available. FINDINGS: Lower chest: Moderate pleural effusion Hepatobiliary: No focal hepatic lesion. Two tiny gallstones measuring less than 5 mm each. No gallbladder distension. No biliary duct dilatation. Common bile duct is normal. Pancreas: Pancreas is normal. No ductal dilatation. No pancreatic inflammation. Spleen: Normal spleen Adrenals/urinary tract: Adrenal glands normal. No nephrolithiasis or ureterolithiasis. Perinephric stranding similar comparison CT. No renal obstruction on delayed imaging. Bladder normal. Stomach/Bowel: Stomach, small-bowel and cecum are normal. The appendix is not identified but there is no pericecal inflammation to suggest appendicitis. Insert Vascular/Lymphatic: Abdominal aorta is normal caliber with atherosclerotic calcification. There is no retroperitoneal or periportal lymphadenopathy. No pelvic lymphadenopathy. Reproductive: Prostate normal Other: small volume of free fluid along the LEFT and RIGHT pericolic gutter. Musculoskeletal: No aggressive osseous lesion. IMPRESSION: 1. No nephrolithiasis or ureterolithiasis. 2. Perinephric stranding similar to comparison CT. No renal obstruction on delayed imaging. 3. Moderate pleural effusion. 4. Small volume of free fluid along the LEFT and RIGHT pericolic gutter. 5. Cholelithiasis without evidence of cholecystitis. 6.  Aortic Atherosclerosis (ICD10-I70.0). Electronically Signed   By: Genevive Bi M.D.   On: 11/01/2022 16:45   US SCROTUM W/DOPPLER  Result Date: 11/01/2022 CLINICAL DATA:  Testicular pain EXAM: SCROTAL ULTRASOUND DOPPLER  ULTRASOUND OF THE TESTICLES TECHNIQUE: Complete ultrasound examination of the testicles, epididymis, and other scrotal structures was performed. Color and spectral Doppler ultrasound were also utilized to evaluate blood flow to the testicles. COMPARISON:  None Available. FINDINGS: Right testicle Measurements: 3.3 x 1.6 x 2.8 cm. No mass or microlithiasis visualized. Left testicle Measurements: 3.4 x 2.2 x 2.2 cm. No mass or microlithiasis visualized. Right epididymis:  Normal in size and appearance. Left epididymis: Epididymal head cyst with internal echogenic debris measuring 1.1 x 1.0 x 1.0 cm. Hydrocele:  Small left hydrocele. Varicocele:  None visualized. Pulsed Doppler interrogation of both testes demonstrates normal low resistance arterial and venous waveforms bilaterally. IMPRESSION: 1. Complex left epididymal head cyst measuring up to 1.1 cm. 2. Small left hydrocele. Electronically Signed   By: Allegra Lai M.D.   On: 11/01/2022 11:46   ECHOCARDIOGRAM COMPLETE  Result Date: 10/21/2022    ECHOCARDIOGRAM REPORT   Patient Name:   [name] Date of Exam: 10/21/2022 Medical Rec #:  [id number]        Height:       69.0 in Accession #:    [id number]       Weight:       260.0 lb Date of Birth:  Apr 23, 1955        BSA:          2.310 m Patient Age:    67 years         BP:           104/78 mmHg Patient Gender: M                HR:           74 bpm. [facility] Procedure: 2D Echo, Cardiac Doppler and Color Doppler Indications:    CHF- Acute Systolic I50.21  History:        Patient has prior history  Gastroenterology Consult   Referring Provider: No ref. provider found Primary Care Physician:  Benetta Spar, MD Primary Gastroenterologist: unassigned (Dr. Tasia Catchings)  Patient ID: [name]; [id number]; 12/31/1955   Admit date: 11/02/2022  LOS: 7 days   Date of Consultation: 11/10/2022  Reason for Consultation:  IDA    History of Present Illness   [name] is a 67 y.o. male with past medical history significant for CAD status post stenting, peripheral vascular disease status post stenting, CKD, cocaine abuse current positive urine drug screen, diabetes, hypertension, GERD, Fournier gangrene, hyperlipidemia, ischemic cardiomyopathy, left ventricular mural thrombus, sleep apnea, meningioma who presented to the ED on October 23 with migratory abdominal pain/back/pelvic pain and dyspnea.  Recent admission, discharged on October 15 for acute CHF with gradual increasing pain in the scrotum, swelling of the scrotum, leg edema.  Patient admitted with acute hypoxic respiratory failure due to acute on chronic HFrEF.  Developed delirium October 25, head CT nonacute.  MRI brain showed chronic meningioma with edema without midline shift.  Hospitalization prolonged secondary to fluid overload requiring aggressive diuresis.  GI now being consulted for iron deficiency anemia.  On admission patient's hemoglobin was 9.3, had been 9.1 two weeks prior.  Hemoglobin had dropped down to 7.4, MCV 76.7.  Patient received 1 unit of blood and hemoglobin 9.0 today.  Ferritin 13, iron 32, iron sats 8%, TIBC 420, B12 576, folate 7.4.  Sed rate 80.  CRP 1.3.  Urine drug screen positive for cocaine.  BUN 52, creatinine 1.69, sodium 132, albumin 2.8, total bilirubin 1.2, alk phos 98, AST 16, ALT 14.  Patient denies melena. Rarely has brbpr. BM 1-2 times per week. No heartburn. Had some abdominal pain but gone now, improved after blood transfusion. N/v earlier in admission but now his appetite is much  better. Hungry now. Still with some DOE and legs feel wobbly when walking to nursing station. Was able to get up and bathe himself today. Denies ASA use. Takes occasional Advil for back pain. Patient states last cocaine use in 09/2022.   Colonoscopy 2008: 2 small hyperplastic polyps removed.  External hemorrhoids.   Prior to Admission medications   Medication Sig Start Date End Date Taking? Authorizing Provider  furosemide (LASIX) 40 MG tablet Take 1 tablet (40 mg total) by mouth daily. 10/25/22 11/24/22 Yes Vassie Loll, MD  gabapentin (NEURONTIN) 300 MG capsule Take 1 capsule (300 mg total) by mouth 3 (three) times daily. 10/25/22  Yes Vassie Loll, MD  nicotine (NICODERM CQ - DOSED IN MG/24 HOURS) 21 mg/24hr patch Place 1 patch (21 mg total) onto the skin daily. 10/26/22  Yes Vassie Loll, MD  atorvastatin (LIPITOR) 40 MG tablet Take 1 tablet (40 mg total) by mouth at bedtime. Patient not taking: Reported on 11/02/2022 10/25/22 11/24/22  Vassie Loll, MD  carvedilol (COREG) 3.125 MG tablet Take 1 tablet (3.125 mg total) by mouth 2 (two) times daily with a meal. Patient not taking: Reported on 11/02/2022 10/25/22 11/24/22  Vassie Loll, MD  empagliflozin (JARDIANCE) 10 MG TABS tablet Take 1 tablet (10 mg total) by mouth daily. Patient not taking: Reported on 11/02/2022 10/26/22   Vassie Loll, MD  pantoprazole (PROTONIX) 40 MG tablet Take 1 tablet (40 mg total) by mouth daily. Patient not taking: Reported on 11/02/2022 10/26/22   Vassie Loll, MD  sildenafil (VIAGRA) 50 MG tablet Take 50 mg by mouth daily as needed for erectile dysfunction. Patient not taking: Reported on 11/02/2022 08/26/22

## 2022-11-11 ENCOUNTER — Telehealth: Payer: Self-pay | Admitting: Gastroenterology

## 2022-11-11 ENCOUNTER — Encounter (HOSPITAL_COMMUNITY)
Admission: EM | Disposition: A | Discharge status: Home Health | Payer: Self-pay | Source: Home / Self Care | Attending: Family Medicine

## 2022-11-11 ENCOUNTER — Inpatient Hospital Stay (HOSPITAL_COMMUNITY): Payer: No Typology Code available for payment source | Admitting: Anesthesiology

## 2022-11-11 ENCOUNTER — Inpatient Hospital Stay (HOSPITAL_COMMUNITY): Payer: No Typology Code available for payment source

## 2022-11-11 ENCOUNTER — Encounter (HOSPITAL_COMMUNITY): Payer: Self-pay | Admitting: Family Medicine

## 2022-11-11 DIAGNOSIS — K3189 Other diseases of stomach and duodenum: Secondary | ICD-10-CM | POA: Diagnosis not present

## 2022-11-11 DIAGNOSIS — D122 Benign neoplasm of ascending colon: Secondary | ICD-10-CM

## 2022-11-11 DIAGNOSIS — I5023 Acute on chronic systolic (congestive) heart failure: Secondary | ICD-10-CM | POA: Diagnosis not present

## 2022-11-11 DIAGNOSIS — I2510 Atherosclerotic heart disease of native coronary artery without angina pectoris: Secondary | ICD-10-CM

## 2022-11-11 DIAGNOSIS — K2990 Gastroduodenitis, unspecified, without bleeding: Secondary | ICD-10-CM

## 2022-11-11 DIAGNOSIS — Z1211 Encounter for screening for malignant neoplasm of colon: Secondary | ICD-10-CM

## 2022-11-11 DIAGNOSIS — R195 Other fecal abnormalities: Secondary | ICD-10-CM

## 2022-11-11 DIAGNOSIS — D509 Iron deficiency anemia, unspecified: Secondary | ICD-10-CM | POA: Diagnosis not present

## 2022-11-11 DIAGNOSIS — J9601 Acute respiratory failure with hypoxia: Secondary | ICD-10-CM | POA: Diagnosis not present

## 2022-11-11 DIAGNOSIS — K2970 Gastritis, unspecified, without bleeding: Secondary | ICD-10-CM | POA: Diagnosis not present

## 2022-11-11 DIAGNOSIS — K644 Residual hemorrhoidal skin tags: Secondary | ICD-10-CM

## 2022-11-11 DIAGNOSIS — D126 Benign neoplasm of colon, unspecified: Secondary | ICD-10-CM

## 2022-11-11 DIAGNOSIS — K2980 Duodenitis without bleeding: Secondary | ICD-10-CM | POA: Diagnosis not present

## 2022-11-11 DIAGNOSIS — N1831 Chronic kidney disease, stage 3a: Secondary | ICD-10-CM | POA: Diagnosis not present

## 2022-11-11 DIAGNOSIS — D123 Benign neoplasm of transverse colon: Secondary | ICD-10-CM | POA: Diagnosis not present

## 2022-11-11 DIAGNOSIS — K648 Other hemorrhoids: Secondary | ICD-10-CM

## 2022-11-11 HISTORY — PX: ESOPHAGOGASTRODUODENOSCOPY (EGD) WITH PROPOFOL: SHX5813

## 2022-11-11 HISTORY — PX: POLYPECTOMY: SHX149

## 2022-11-11 HISTORY — PX: SCLEROTHERAPY: SHX6841

## 2022-11-11 HISTORY — PX: SUBMUCOSAL TATTOO INJECTION: SHX6856

## 2022-11-11 HISTORY — PX: HEMOSTASIS CLIP PLACEMENT: SHX6857

## 2022-11-11 HISTORY — PX: COLONOSCOPY WITH PROPOFOL: SHX5780

## 2022-11-11 HISTORY — PX: BIOPSY: SHX5522

## 2022-11-11 LAB — CBC WITH DIFFERENTIAL/PLATELET
Abs Immature Granulocytes: 0.09 10*3/uL — ABNORMAL HIGH (ref 0.00–0.07)
Basophils Absolute: 0 10*3/uL (ref 0.0–0.1)
Basophils Relative: 0 %
Eosinophils Absolute: 0 10*3/uL (ref 0.0–0.5)
Eosinophils Relative: 0 %
HCT: 27.9 % — ABNORMAL LOW (ref 39.0–52.0)
Hemoglobin: 8.1 g/dL — ABNORMAL LOW (ref 13.0–17.0)
Immature Granulocytes: 1 %
Lymphocytes Relative: 6 %
Lymphs Abs: 0.7 10*3/uL (ref 0.7–4.0)
MCH: 22.5 pg — ABNORMAL LOW (ref 26.0–34.0)
MCHC: 29 g/dL — ABNORMAL LOW (ref 30.0–36.0)
MCV: 77.5 fL — ABNORMAL LOW (ref 80.0–100.0)
Monocytes Absolute: 0.9 10*3/uL (ref 0.1–1.0)
Monocytes Relative: 7 %
Neutro Abs: 10.8 10*3/uL — ABNORMAL HIGH (ref 1.7–7.7)
Neutrophils Relative %: 86 %
Platelets: 261 10*3/uL (ref 150–400)
RBC: 3.6 MIL/uL — ABNORMAL LOW (ref 4.22–5.81)
RDW: 21.2 % — ABNORMAL HIGH (ref 11.5–15.5)
WBC: 12.5 10*3/uL — ABNORMAL HIGH (ref 4.0–10.5)
nRBC: 0.3 % — ABNORMAL HIGH (ref 0.0–0.2)

## 2022-11-11 LAB — COMPREHENSIVE METABOLIC PANEL
ALT: 52 U/L — ABNORMAL HIGH (ref 0–44)
AST: 67 U/L — ABNORMAL HIGH (ref 15–41)
Albumin: 2.7 g/dL — ABNORMAL LOW (ref 3.5–5.0)
Alkaline Phosphatase: 102 U/L (ref 38–126)
Anion gap: 7 (ref 5–15)
BUN: 49 mg/dL — ABNORMAL HIGH (ref 8–23)
CO2: 28 mmol/L (ref 22–32)
Calcium: 8.4 mg/dL — ABNORMAL LOW (ref 8.9–10.3)
Chloride: 98 mmol/L (ref 98–111)
Creatinine, Ser: 1.6 mg/dL — ABNORMAL HIGH (ref 0.61–1.24)
GFR, Estimated: 47 mL/min — ABNORMAL LOW (ref >60)
Glucose, Bld: 119 mg/dL — ABNORMAL HIGH (ref 70–99)
Potassium: 4.4 mmol/L (ref 3.5–5.1)
Sodium: 133 mmol/L — ABNORMAL LOW (ref 135–145)
Total Bilirubin: 0.8 mg/dL (ref 0.3–1.2)
Total Protein: 7.3 g/dL (ref 6.5–8.1)

## 2022-11-11 LAB — GLUCOSE, CAPILLARY
Glucose-Capillary: 108 mg/dL — ABNORMAL HIGH (ref 70–99)
Glucose-Capillary: 108 mg/dL — ABNORMAL HIGH (ref 70–99)
Glucose-Capillary: 367 mg/dL — ABNORMAL HIGH (ref 70–99)
Glucose-Capillary: 447 mg/dL — ABNORMAL HIGH (ref 70–99)

## 2022-11-11 LAB — T4, FREE: Free T4: 0.61 ng/dL (ref 0.61–1.12)

## 2022-11-11 LAB — TSH: TSH: 1.349 u[IU]/mL (ref 0.350–4.500)

## 2022-11-11 SURGERY — COLONOSCOPY WITH PROPOFOL
Anesthesia: General

## 2022-11-11 MED ORDER — INSULIN ASPART 100 UNIT/ML IJ SOLN
6.0000 [IU] | Freq: Once | INTRAMUSCULAR | Status: AC
  Administered 2022-11-11: 6 [IU] via SUBCUTANEOUS

## 2022-11-11 MED ORDER — PROPOFOL 10 MG/ML IV BOLUS
INTRAVENOUS | Status: DC | PRN
  Administered 2022-11-11: 20 mg via INTRAVENOUS
  Administered 2022-11-11: 150 mg via INTRAVENOUS
  Administered 2022-11-11: 20 mg via INTRAVENOUS
  Administered 2022-11-11: 25 mg via INTRAVENOUS
  Administered 2022-11-11 (×2): 20 mg via INTRAVENOUS

## 2022-11-11 MED ORDER — EPHEDRINE SULFATE (PRESSORS) 50 MG/ML IJ SOLN
INTRAMUSCULAR | Status: DC | PRN
Start: 2022-11-11 — End: 2022-11-11
  Administered 2022-11-11 (×2): 10 mg via INTRAVENOUS

## 2022-11-11 MED ORDER — SODIUM CHLORIDE (PF) 0.9 % IJ SOLN
PREFILLED_SYRINGE | INTRAMUSCULAR | Status: DC | PRN
  Administered 2022-11-11: 2.5 mL

## 2022-11-11 MED ORDER — SPOT INK MARKER SYRINGE KIT
PACK | SUBMUCOSAL | Status: DC | PRN
  Administered 2022-11-11: 1.6 mL via SUBMUCOSAL

## 2022-11-11 MED ORDER — LIDOCAINE HCL 1 % IJ SOLN
INTRAMUSCULAR | Status: DC | PRN
  Administered 2022-11-11: 50 mg via INTRADERMAL

## 2022-11-11 MED ORDER — SODIUM CHLORIDE 0.9 % IV SOLN: INTRAVENOUS | Status: DC

## 2022-11-11 MED ORDER — LACTATED RINGERS IV SOLN: INTRAVENOUS | Status: DC | PRN

## 2022-11-11 MED ORDER — IRON SUCROSE 20 MG/ML IV SOLN
100.0000 mg | Freq: Once | INTRAVENOUS | Status: AC
  Administered 2022-11-11: 100 mg via INTRAVENOUS
  Filled 2022-11-11: qty 5

## 2022-11-11 MED ORDER — THIAMINE HCL 100 MG/ML IJ SOLN
INTRAMUSCULAR | Status: AC
Start: 2022-11-11 — End: ?
  Filled 2022-11-11: qty 6

## 2022-11-11 MED ORDER — FUROSEMIDE 10 MG/ML IJ SOLN
40.0000 mg | Freq: Once | INTRAMUSCULAR | Status: AC
  Administered 2022-11-11: 40 mg via INTRAVENOUS
  Filled 2022-11-11: qty 4

## 2022-11-11 NOTE — Interval H&P Note (Addendum)
History and Physical Interval Note:  11/11/2022 9:49 AM  [name]  has presented today for surgery, with the diagnosis of iron deficiency anemia, hemoccult positive stool.  The various methods of treatment have been discussed with the patient and family. After consideration of risks, benefits and other options for treatment, the patient has consented to  Procedure(s): COLONOSCOPY WITH PROPOFOL (N/A) ESOPHAGOGASTRODUODENOSCOPY (EGD) WITH PROPOFOL (N/A) as a surgical intervention.  The patient's history has been reviewed, patient examined, no change in status, stable for surgery.  I have reviewed the patient's chart and labs.  Questions were answered to the patient's satisfaction.    We will proceed with EGD/Colonoscopy as scheduled.    I thoroughly discussed with the patient the procedure, including the risks involved. Patient understands what the procedure involves including the benefits and any risks. Patient understands alternatives to the proposed procedure. Risks including (but not limited to) bleeding, tearing of the lining (perforation), rupture of adjacent organs, problems with heart and lung function, infection, and medication reactions. A small percentage of complications may require surgery, hospitalization, repeat endoscopic procedure, and/or transfusion.  Patient understood and agreed.    Juanetta Beets [name]

## 2022-11-11 NOTE — Progress Notes (Signed)
Patient underwent EGD and Colonoscopy under propofol sedation.  Tolerated the procedure adequately.   FINDINGS:  EGD:  - Normal esophagus.  - Erythematous mucosa in the antrum. Biopsied.  - Duodenitis. Biopsied.  Colonoscopy   - Preparation of the colon was inadequate.  - One 10 mm polyp in the ascending colon, removed with a hot snare. Resected and retrieved.   - One 25 mm polyp in the transverse colon, removed with mucosal resection. Resected and retrieved. Clips ( MR conditional) were placed. Clip manufacturer: [employer].   - One 20 mm polyp in the transverse colon, removed with mucosal resection. Resected and retrieved. Clips ( MR conditional) were placed. Clip manufacturer: [employer]. Tattooed.  - One 10 mm polyp in the transverse colon, removed with a hot snare. Resected and retrieved.   - Stool in the entire examined colon.   - Non- bleeding external and internal hemorrhoids.  - Mucosal resection was performed. Resection and retrieval were complete.  - Mucosal resection was performed. Resection and retrieval were complete.  RECOMMENDATIONS  - Repeat colonoscopy at next available appointment ( within 3 months) because the bowel preparation was poor.  - Resume previous diet.  - Await pathology results.  - No ibuprofen, naproxen, or other non- steroidal anti-inflammatory drugs for 10 days after polyp removal.  - Likely cause of iron deficiency was large polyps and one of them with visible oozing .  - Start PO iron q 48 hours and consider IV iron loading while inpatient -Patient to follow up in GI clinic in 4-6 weeks after discharge   Vista Lawman, MD Gastroenterology and Hepatology [facility]

## 2022-11-11 NOTE — Plan of Care (Signed)
  Problem: Education: Goal: Knowledge of disease or condition will improve Outcome: Progressing Goal: Knowledge of secondary prevention will improve (MUST DOCUMENT ALL) Outcome: Progressing Goal: Knowledge of patient specific risk factors will improve [name] N/A or DELETE if not current risk factor) Outcome: Progressing   Problem: Ischemic Stroke/TIA Tissue Perfusion: Goal: Complications of ischemic stroke/TIA will be minimized Outcome: Progressing   Problem: Coping: Goal: Will verbalize positive feelings about self Outcome: Progressing Goal: Will identify appropriate support needs Outcome: Progressing   Problem: Health Behavior/Discharge Planning: Goal: Ability to manage health-related needs will improve Outcome: Progressing Goal: Goals will be collaboratively established with patient/family Outcome: Progressing   Problem: Self-Care: Goal: Ability to participate in self-care as condition permits will improve Outcome: Progressing Goal: Verbalization of feelings and concerns over difficulty with self-care will improve Outcome: Progressing Goal: Ability to communicate needs accurately will improve Outcome: Progressing   Problem: Nutrition: Goal: Risk of aspiration will decrease Outcome: Progressing Goal: Dietary intake will improve Outcome: Progressing   Problem: Education: Goal: Knowledge of General Education information will improve Description: Including pain rating scale, medication(s)/side effects and non-pharmacologic comfort measures Outcome: Progressing   Problem: Health Behavior/Discharge Planning: Goal: Ability to manage health-related needs will improve Outcome: Progressing   Problem: Clinical Measurements: Goal: Ability to maintain clinical measurements within normal limits will improve Outcome: Progressing Goal: Will remain free from infection Outcome: Progressing Goal: Diagnostic test results will improve Outcome: Progressing Goal: Respiratory  complications will improve Outcome: Progressing Goal: Cardiovascular complication will be avoided Outcome: Progressing   Problem: Activity: Goal: Risk for activity intolerance will decrease Outcome: Progressing   Problem: Nutrition: Goal: Adequate nutrition will be maintained Outcome: Progressing   Problem: Coping: Goal: Level of anxiety will decrease Outcome: Progressing   Problem: Elimination: Goal: Will not experience complications related to bowel motility Outcome: Progressing Goal: Will not experience complications related to urinary retention Outcome: Progressing   Problem: Pain Management: Goal: General experience of comfort will improve Outcome: Progressing   Problem: Safety: Goal: Ability to remain free from injury will improve Outcome: Progressing   Problem: Skin Integrity: Goal: Risk for impaired skin integrity will decrease Outcome: Progressing   Problem: Education: Goal: Ability to describe self-care measures that may prevent or decrease complications (Diabetes Survival Skills Education) will improve Outcome: Progressing Goal: Individualized Educational Video(s) Outcome: Progressing   Problem: Coping: Goal: Ability to adjust to condition or change in health will improve Outcome: Progressing   Problem: Fluid Volume: Goal: Ability to maintain a balanced intake and output will improve Outcome: Progressing   Problem: Health Behavior/Discharge Planning: Goal: Ability to identify and utilize available resources and services will improve Outcome: Progressing Goal: Ability to manage health-related needs will improve Outcome: Progressing   Problem: Metabolic: Goal: Ability to maintain appropriate glucose levels will improve Outcome: Progressing   Problem: Nutritional: Goal: Maintenance of adequate nutrition will improve Outcome: Progressing Goal: Progress toward achieving an optimal weight will improve Outcome: Progressing   Problem: Skin  Integrity: Goal: Risk for impaired skin integrity will decrease Outcome: Progressing   Problem: Tissue Perfusion: Goal: Adequacy of tissue perfusion will improve Outcome: Progressing

## 2022-11-11 NOTE — Anesthesia Postprocedure Evaluation (Signed)
Anesthesia Post Note  Patient: [name]  Procedure(s) Performed: COLONOSCOPY WITH PROPOFOL ESOPHAGOGASTRODUODENOSCOPY (EGD) WITH PROPOFOL BIOPSY POLYPECTOMY INTESTINAL SCLEROTHERAPY SUBMUCOSAL TATTOO INJECTION HEMOSTASIS CLIP PLACEMENT  Patient location during evaluation: PACU Anesthesia Type: General Level of consciousness: awake and alert Pain management: pain level controlled Vital Signs Assessment: post-procedure vital signs reviewed and stable Respiratory status: spontaneous breathing Cardiovascular status: blood pressure returned to baseline and stable : no further nausea or vomiting. Anesthetic complications: no   No notable events documented.   Last Vitals:  Vitals:   11/11/22 0523 11/11/22 0940  BP: 113/78 (!) 120/95  Pulse: 66   Resp: 16 14  Temp: (!) 36.3 C (!) 36.4 C  SpO2: 97% 98%    Last Pain:  Vitals:   11/11/22 1043  TempSrc:   PainSc: 5                  Monasia Lair

## 2022-11-11 NOTE — Telephone Encounter (Addendum)
[name] - Please make hospital follow up in 4-6 weeks with Dr. Tasia Catchings.   Jacques Earthly - please arrange for CBC 1 week after discharge.   Brooke Bonito, MSN, APRN, FNP-BC, AGACNP-BC [facility] Gastroenterology at [facility]

## 2022-11-11 NOTE — Transfer of Care (Signed)
Immediate Anesthesia Transfer of Care Note  Patient: [name]  Procedure(s) Performed: COLONOSCOPY WITH PROPOFOL ESOPHAGOGASTRODUODENOSCOPY (EGD) WITH PROPOFOL BIOPSY POLYPECTOMY INTESTINAL SCLEROTHERAPY SUBMUCOSAL TATTOO INJECTION HEMOSTASIS CLIP PLACEMENT  Patient Location: PACU  Anesthesia Type:General  Level of Consciousness: awake  Airway & Oxygen Therapy: Patient Spontanous Breathing  Post-op Assessment: Report given to RN  Post vital signs: Reviewed and stable  Last Vitals:  Vitals Value Taken Time  BP    Temp    Pulse    Resp    SpO2      Last Pain:  Vitals:   11/11/22 1043  TempSrc:   PainSc: 5       Patients Stated Pain Goal: 4 (11/11/22 0947)  Complications: No notable events documented.

## 2022-11-11 NOTE — Progress Notes (Addendum)
Patient underwent colonoscopy and upper endoscopy today.  Per recommendations from Dr. Tasia Catchings: -Avoid ibuprofen, naproxen, or other non- steroidal anti-inflammatory drugs for 10 days after polyp removal.  - Likely cause of iron deficiency was large polyps and one of them with visible oozing .  - Start PO iron q 48 hours and consider IV iron loading while inpatient - Patient to follow up in GI clinic in 4-6 weeks after discharge (we will arrange)  Okay to start regular diet.  GI is signing off.   Brooke Bonito, MSN, APRN, FNP-BC, AGACNP-BC [facility]

## 2022-11-11 NOTE — Progress Notes (Signed)
PROGRESS NOTE  [name] WUX:[id number] DOB: 01-29-1955 DOA: 11/02/2022 PCP: Benetta Spar, MD  Brief History:   67 y.o. male with a history of CAD, tobacco use, HTN, HLD, T2DM, GERD, history of cocaine use, OSA, and combined CHF (LVEF 20-25%, G2DD) who presented to the ED 10/23 with migratory abdominal/back/pelvic pain and dyspnea. He was admitted for recurrent acute on chronic combined CHF, [place] +cocaine. IV diuresis is underway with cardiology consultation. Developed delirium 10/25, head CT nonacute. Mentation has cleared up a bit but not at baseline. CBG shows no new hypercarbia. MR brain shows chronic meningioma with edema without midline shift.  Hospitalization has been prolonged secondary to fluid overload requiring aggressive diuresis as well as drop in hemoglobin.  Patient was transfused 1 unit PRBC.  GI was consulted assist with management.   Assessment/Plan: Acute hypoxic respiratory failure due to acute on chronic HFrEF, pleural effusions:  - initially on 2L - oxygenation improved with oxygenation - Continue to stress incentive spirometry, ambulate the patient as atelectasis is contributing. -now stable on RA, but uses oxygen for comfort -11/02/22 CTA chest--no dissection; +pleural eff, +pulmonary edema -11/02/22 CTA abd--chronic occlusion of left common iliac with reconstitution of left fem artery;  no acute intraabdominal process   Acute metabolic encephalopathy, delirium:  -Possibly element of hospital delirium and/or cocaine withdrawal.  - CT head >> no acute findings, though chronic microvascular disease and meningioma decrease threshold for confusion.  - LFT's/ammonia wnl.  - 10/28 VBG 1.28/53/<31/29 - No urinary retention noted per RN - 10/27 MR brain--slight interval growth of meningioma with progressive edema of R-temporal lobe - Keep delirium precautions and minimize sedating medications if able.  - Mentation improving after 2 days of  dexamethasone -10/31 UA/culture--no pyuria -B12--576, folate 7.4 -TSH 1.349 -Ammonia - <10   CKD 3a -baseline creatinine 1.4-1.7 -monitor with diuresis   Acute on chronic combined HFrEF and RV failure:  - Unclear whether he was taking cardiac medications PTA, though he reports he was. Given severity of biventricular failure, cardiology consulted for advice. We will optimize volume status then discharge.  - Monitor I/O (actually turned a positive balance when PM lasix dose held), ReDS vest >40% on 10/28  - remains clinically fluid overloaded>>continued IV lasix - Given cocaine positivity, stopped beta blocker initially -10/21/22 Echo--EF 20-25%, G2DD, mild MR -transitioned to po torsemide 10/31     Meningioma: Extra-axial brain mass known since 2023. Had mediastinal lymphadenopathy and pleural effusion sampled during that admission which did not show malignancy. Plan per neurosurgery was to biopsy brain mass if those were negative.  - Given persistent encephalopathy and cerebral edema suggested by CT, we checked MRI w/ and w/o contrast which shows increased size in meningioma with chronic cerebral edema. Dr. Jarvis Newcomer spoke with Dr. Maurice Small who initially saw this patient. He reviewed updated images and history. Recommendation is for outpatient follow up, though with small size and appearance so characteristic of meningioma, no intervention is anticipated. Given the confusion and cerebral edema, he was ok with short trial of decadron which we have pursued.    Cocaine abuse: +UDS - Cessation counseling   Acute on Chronic microcytic anemia:  -Hgb 10>>7.4 though no gross bleeding is noted. BUN up, though likely due to steroids and pt is on PPI.   - iron saturation 8%, ferritin 13 -foalte 7.4, B12 576 - Increase PPI to BID, check FOBT. Only record of GI evaluation was colonoscopy 2012 showing  2 sigmoid polyps, external hemorrhoids.  - Ok to continue lovenox VTE ppx for now unless bleeding is  noted. -GI consult appreciated -10/30--transfuse one unit PRBC -11/1 EGD--ertythematous mucosa in antrum, +duodenitis -11/1 colonoscopy--3 polyps in transverse colon, one in ascending colon;  iron deficiency due to large polyps with one with visible oozine -11/1--give venofer x 1 -start ferrous sulfate in 11/2   Scrotal pain, history of Fournier's gangrene: U/S reassuring (stable epididymal cyst), ?if hydrocele from overload contributing. Also note Dx from PCP "scrotal pain, chronic, bilateral" a year ago.  - Will discontinue SGLT2i permanently. - Supportive care, urology follow up.   OSA:  - CPAP at bedtime   Peripheral neuropathy:  - Continue gabapentin   CAD, HTN: No chest pain. Later did have atypical chest pain likely musculoskeletal with persistently negative troponin and no ischemic ECG changes.  - Continue ASA, statin, BB   Cellulitis: Localized induration in areas where he may have injured the skin. No fluctuance/indication for I&D at this time.  - Treating with keflex.  - Also has likely dermal inclusion cyst on left posterior neck that does not appear infected.    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  - Needs vascular surgery evaluation as outpatient.      T2DM:  - SSI.  -10/21/22 HbA1c was 6.3%, seeing some steroid-induced hyperglycemia  -change to resistant SSI    Tobacco use:  - Nicotine patch - Cessation counseling   Alcohol dependence -start high dose thiamine -may have a degree of Wernicke-Korsakoff syndrome     Family Communication:   daughter updated 11/1   Consultants:  cardiology, GI   Code Status:  FULL    DVT Prophylaxis:   [place] Lovenox     Procedures: As Listed in Progress Note Above   Antibiotics: cephalexin       Subjective: Patient denies fevers, chills, headache, chest pain, dyspnea, nausea, vomiting, diarrhea, abdominal pain, dysuria, hematuria, hematochezia, and  melena.   Objective: Vitals:   11/11/22 1215 11/11/22 1230 11/11/22 1242 11/11/22 1300  BP: 126/87 125/80 (!) 122/54 120/80  Pulse: 77 78 75 84  Resp: 16 16 14 16   Temp:   (!) 97.4 F (36.3 C) 98.6 F (37 C)  TempSrc:    Oral  SpO2: 100% 100% 100% 98%  Weight:      Height:        Intake/Output Summary (Last 24 hours) at 11/11/2022 1740 Last data filed at 11/11/2022 1600 Gross per 24 hour  Intake 1575.88 ml  Output 600 ml  Net 975.88 ml   Weight change: -2.5 kg Exam:  General:  Pt is alert, follows commands appropriately, not in acute distress HEENT: No icterus, No thrush, No neck mass, [place]/AT Cardiovascular: RRR, S1/S2, no rubs, no gallops Respiratory: fine bibasilar rales. No wheeze Abdomen: Soft/+BS, non tender, non distended, no guarding Extremities: 1+LE edema, No lymphangitis, No petechiae, No rashes, no synovitis   Data Reviewed: I have personally reviewed following labs and imaging studies Basic Metabolic Panel: Recent Labs  Lab 11/07/22 0419 11/08/22 0448 11/09/22 0503 11/10/22 0448 11/11/22 0414  NA 136 132* 133* 132* 133*  K 4.4 4.6 4.4 4.3 4.4  CL 101 98 98 95* 98  CO2 25 27 27 26 28   GLUCOSE 110* 262* 201* 266* 119*  BUN 31* 38* 43* 52* 49*  CREATININE 1.43* 1.52* 1.61* 1.69* 1.60*  CALCIUM 8.1* 8.4* 8.5* 8.6* 8.4*  MG  --   --   --  2.3  --    Liver Function Tests: Recent Labs  Lab 11/05/22 1122 11/11/22 0414  AST 16 67*  ALT 14 52*  ALKPHOS 98 102  BILITOT 1.2 0.8  PROT 8.0 7.3  ALBUMIN 2.8* 2.7*   No results for input(s): "LIPASE", "AMYLASE" in the last 168 hours. Recent Labs  Lab 11/05/22 1235 11/10/22 2128  AMMONIA 14 <10   Coagulation Profile: No results for input(s): "INR", "PROTIME" in the last 168 hours. CBC: Recent Labs  Lab 11/08/22 0448 11/09/22 0503 11/10/22 0448 11/11/22 0414  WBC 9.8 12.8* 16.0* 12.5*  NEUTROABS  --   --   --  10.8*  HGB 7.8* 7.4* 9.0* 8.1*  HCT 26.8* 25.4* 30.4* 27.9*  MCV 77.9* 76.7*  77.6* 77.5*  PLT 252 277 304 261   Cardiac Enzymes: No results for input(s): "CKTOTAL", "CKMB", "CKMBINDEX", "TROPONINI" in the last 168 hours. BNP: Invalid input(s): "POCBNP" CBG: Recent Labs  Lab 11/10/22 1624 11/10/22 2219 11/11/22 0715 11/11/22 0944 11/11/22 1643  GLUCAP 270* 134* 108* 108* 367*   HbA1C: No results for input(s): "HGBA1C" in the last 72 hours. Urine analysis:    Component Value Date/Time   COLORURINE STRAW (A) 11/10/2022 2008   APPEARANCEUR CLEAR 11/10/2022 2008   APPEARANCEUR Clear 08/02/2021 1051   LABSPEC 1.006 11/10/2022 2008   PHURINE 5.0 11/10/2022 2008   GLUCOSEU NEGATIVE 11/10/2022 2008   HGBUR NEGATIVE 11/10/2022 2008   BILIRUBINUR NEGATIVE 11/10/2022 2008   BILIRUBINUR Negative 08/02/2021 1051   KETONESUR NEGATIVE 11/10/2022 2008   PROTEINUR NEGATIVE 11/10/2022 2008   UROBILINOGEN 0.2 09/27/2014 2237   NITRITE NEGATIVE 11/10/2022 2008   LEUKOCYTESUR NEGATIVE 11/10/2022 2008   Sepsis Labs: @LABRCNTIP (procalcitonin:4,lacticidven:4) )No results found for this or any previous visit (from the past 240 hour(s)).   Scheduled Meds:  aspirin EC  81 mg Oral Daily   atorvastatin  40 mg Oral QHS   cephALEXin  500 mg Oral Q8H   dexamethasone  4 mg Oral Daily   enoxaparin (LOVENOX) injection  40 mg Subcutaneous Q24H   folic acid  1 mg Oral Daily   gabapentin  300 mg Oral TID   insulin aspart  0-20 Units Subcutaneous TID WC   insulin aspart  0-5 Units Subcutaneous QHS   insulin aspart  5 Units Subcutaneous TID WC   lidocaine  1 patch Transdermal Q24H   linaclotide  145 mcg Oral QAC breakfast   nicotine  21 mg Transdermal Daily   pantoprazole  40 mg Oral BID   polyethylene glycol-electrolytes  4,000 mL Oral Once   sodium chloride flush  3 mL Intravenous Q12H   torsemide  20 mg Oral Daily   Continuous Infusions:  thiamine (VITAMIN B1) injection 500 mg (11/11/22 1352)    Procedures/Studies: US Abdomen Limited RUQ (LIVER/GB)  Result Date:  11/11/2022 CLINICAL DATA:  Abdominal pain.  Transaminitis EXAM: ULTRASOUND ABDOMEN LIMITED RIGHT UPPER QUADRANT COMPARISON:  CT 11/01/2022. FINDINGS: Gallbladder: Mildly distended gallbladder. Dependent stones. No adjacent fluid. Slightly thickened wall of up to 4 mm. No reported sonographic Murphy's sign. Common bile duct: Diameter: 3 mm Liver: No focal lesion identified. Within normal limits in parenchymal echogenicity. Portal vein is patent on color Doppler imaging with normal direction of blood flow towards the liver. Other: None. IMPRESSION: Gallbladder mildly distended with stones. Slight wall thickening. No ductal dilatation. Please correlate for other clinical presentation of acute cholecystitis and if needed confirmatory HIDA scan as clinically appropriate Electronically Signed   By: Piedad Climes.D.  On: 11/11/2022 13:56   DG CHEST PORT 1 VIEW  Result Date: 11/07/2022 CLINICAL DATA:  Acute respiratory failure, hypoxia EXAM: PORTABLE CHEST 1 VIEW COMPARISON:  11/02/2022 FINDINGS: Single frontal view of the chest demonstrates an enlarged cardiac silhouette. Mild pulmonary vascular congestion, with minimal bibasilar airspace disease left greater than right. Trace left effusions are suspected. No pneumothorax. No acute bony abnormalities. IMPRESSION: 1. Findings consistent with mild congestive heart failure. Electronically Signed   By: Sharlet Salina M.D.   On: 11/07/2022 15:42   MR BRAIN W WO CONTRAST  Result Date: 11/06/2022 CLINICAL DATA:  Meningioma follow-up. EXAM: MRI HEAD WITHOUT AND WITH CONTRAST TECHNIQUE: Multiplanar, multiecho pulse sequences of the brain and surrounding structures were obtained without and with intravenous contrast. CONTRAST:  10mL GADAVIST GADOBUTROL 1 MMOL/ML IV SOLN COMPARISON:  CT head without contrast 11/05/2022. Head without and with contrast 11/08/2021. FINDINGS: Brain: A meningioma along the medial aspect of the right middle cranial fossa demonstrates slight  interval growth now measuring 3.0 x 1.9 x 2.1 cm. The meningioma does not extend into the right cavernous sinus and is separate from the right orbital apex. Progressive edema is present throughout the right temporal lobe. A remote infarct is present in the right occipital lobe. Moderate generalized atrophy and white matter disease is present bilaterally. The ventricles are proportionate to the degree of atrophy. No significant extraaxial fluid collection is present. Postcontrast images demonstrate no other pathologic enhancement. White matter changes extend into the brainstem. Remote lacunar infarcts are present in the cerebellum bilaterally. The internal auditory canals are within normal limits. Midline structures are within normal limits. Vascular: Flow is present in the major intracranial arteries. Skull and upper cervical spine: Degenerative changes are present in the upper cervical spine. The craniocervical junction is normal. Marrow signal is normal. Sinuses/Orbits: Bilateral mastoid effusions are present. No obstructing nasopharyngeal lesion is present. The paranasal sinuses and mastoid air cells are otherwise clear. IMPRESSION: 1. Slight interval growth of meningioma along the medial aspect of the right middle cranial fossa now measuring 3.0 x 1.9 x 2.1 cm. 2. Progressive edema throughout the right temporal lobe. 3. Remote infarct of the right occipital lobe. 4. Remote lacunar infarcts of the cerebellum bilaterally. 5. Moderate generalized atrophy and white matter disease likely reflects the sequela of chronic microvascular ischemia. 6. Bilateral mastoid effusions. No obstructing nasopharyngeal lesion is present. Electronically Signed   By: Marin Roberts M.D.   On: 11/06/2022 19:44   CT HEAD WO CONTRAST ( )  Result Date: 11/05/2022 CLINICAL DATA:  67 year old male altered mental status. EXAM: CT HEAD WITHOUT CONTRAST TECHNIQUE: Contiguous axial images were obtained from the base of the skull  through the vertex without intravenous contrast. RADIATION DOSE REDUCTION: This exam was performed according to the departmental dose-optimization program which includes automated exposure control, adjustment of the mA and/or kV according to patient size and/or use of iterative reconstruction technique. COMPARISON:  Brain MRI 11/08/2021.  Head CT 11/07/2021. FINDINGS: Brain: Chronic abnormal hypodensity in the right temporal lobe tracking into the deep white matter capsules, most resembles vasogenic edema. Chronic enhancing mass at the mesial right temporal lobe, lateral to the cavernous sinus on MRI last year, occult by plain CT. Stable underlying cerebral volume and superimposed chronic right PCA territory encephalomalacia. Stable chronic small vessel disease, patchy additional white matter and thalamic involvement. No midline shift or new intracranial mass effect. No acute cortically based infarct. No acute intracranial hemorrhage identified. Vascular: Calcified atherosclerosis at the skull base. No suspicious  intracranial vascular hyperdensity. Skull: No acute osseous abnormality identified. Sinuses/Orbits: Visualized paranasal sinuses and mastoids are stable and well aerated. Other: Visualized orbits and scalp soft tissues are within normal limits. IMPRESSION: 1. Chronically abnormal right middle cranial fossa where vasogenic edema in the right temporal lobe appears related to enhancing mass lateral to the right cavernous sinus on MRI last year (occult by CT). Stability and time course favor a Meningioma with chronic cerebral edema. No midline shift or progression by noncontrast CT. 2. No acute intracranial abnormality. Underlying chronic right PCA territory infarct. Electronically Signed   By: Odessa Fleming M.D.   On: 11/05/2022 12:36   DG Abd 1 View  Result Date: 11/03/2022 CLINICAL DATA:  Abdominal pain.  Constipation. EXAM: ABDOMEN - 1 VIEW COMPARISON:  CTA yesterday.  Abdominopelvic CT 2 days ago FINDINGS:  Bilateral pleural effusions. Cardiomegaly. Moderate volume of colonic stool. No bowel obstruction. There are vascular calcifications. Excreted IV contrast in the urinary bladder. IMPRESSION: 1. Moderate colonic stool burden. No bowel obstruction. 2. Bilateral pleural effusions. Cardiomegaly. Electronically Signed   By: Narda Rutherford M.D.   On: 11/03/2022 23:40   CT Angio Chest/Abd/Pel for Dissection W and/or Wo Contrast  Result Date: 11/02/2022 CLINICAL DATA:  Chest pain.  Acute aortic syndrome EXAM: CT ANGIOGRAPHY CHEST, ABDOMEN AND PELVIS TECHNIQUE: Non-contrast CT of the chest was initially obtained. Multidetector CT imaging through the chest, abdomen and pelvis was performed using the standard protocol during bolus administration of intravenous contrast. Multiplanar reconstructed images and MIPs were obtained and reviewed to evaluate the vascular anatomy. RADIATION DOSE REDUCTION: This exam was performed according to the departmental dose-optimization program which includes automated exposure control, adjustment of the mA and/or kV according to patient size and/or use of iterative reconstruction technique. CONTRAST:  OMNIPAQUE IOHEXOL 350 MG/ML SOLN COMPARISON:  CT chest abdomen pelvis 11/07/2021 CT abdomen 11/01/2022 FINDINGS: CTA CHEST FINDINGS Cardiovascular: Noncontrast series demonstrates no intramural hematoma within the thoracic aorta. Contrast series demonstrates no aortic dissection or aneurysm. Great vessels normal. No pericardial fluid. Mediastinum/Nodes: Enlarged mediastinal lymph nodes are not changed from CT 1 year prior. For example LEFT lower paratracheal lymph node measuring 17 mm compared to 16 mm. RIGHT lower paratracheal node measuring 16 mm compares to 20 mm. Lungs/Pleura: Bilateral pleural effusions. Moderate effusion on the RIGHT and small effusion on the LEFT. Mild diffuse ground-glass density in the lungs suggest mild pulmonary edema. Mild interstitial thickening chest mild  interstitial edema. No pneumothorax. No pneumonia. Musculoskeletal: No aggressive osseous lesion. Review of the MIP images confirms the above findings. CTA ABDOMEN AND PELVIS FINDINGS VASCULAR Aorta: Normal caliber aorta without aneurysm, dissection, vasculitis or significant stenosis. Celiac: Patent without evidence of aneurysm, dissection, vasculitis or significant stenosis. SMA: Patent without evidence of aneurysm, dissection, vasculitis or significant stenosis. Renals: Both renal arteries are patent without evidence of aneurysm, dissection, vasculitis, fibromuscular dysplasia or significant stenosis. IMA: Patent without evidence of aneurysm, dissection, vasculitis or significant stenosis. 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 Reconstitution of the LEFT femoral artery. No interval change. Veins: No obvious venous abnormality within the limitations of this arterial phase study. Review of the MIP images confirms the above findings. NON-VASCULAR Lower chest: Lung bases are clear. Hepatobiliary: No focal hepatic lesion. Normal gallbladder. No biliary duct dilatation. Common bile duct is normal. Pancreas: Pancreas is normal. No ductal dilatation. No pancreatic inflammation. Spleen: Normal spleen Adrenals/urinary tract: Adrenal glands and kidneys are normal. The ureters and bladder normal. Stomach/Bowel: Stomach, small bowel,  appendix, and cecum are normal. The colon and rectosigmoid colon are normal. Vascular/Lymphatic: Abdominal aorta is normal caliber. No periportal or retroperitoneal adenopathy. No pelvic adenopathy. Reproductive: Prostate unremarkable Other: No free fluid. Musculoskeletal: No aggressive osseous lesion. Review of the MIP images confirms the above findings. IMPRESSION: CHEST: 1. No evidence of aortic dissection or aneurysm. 2. Bilateral pleural effusions and mild pulmonary edema. 3. Stable mediastinal adenopathy. PELVIS: 1. No evidence of aortic dissection or aneurysm. 2.  Chronic occlusion of the LEFT common iliac artery with reconstitution of the LEFT femoral artery. 3. No acute findings in the abdomen pelvis. Electronically Signed   By: Genevive Bi M.D.   On: 11/02/2022 11:25   DG Chest Portable 1 View  Result Date: 11/02/2022 CLINICAL DATA:  Shortness of breath EXAM: PORTABLE CHEST 1 VIEW COMPARISON:  Chest radiograph dated 10/21/2022 FINDINGS: Normal lung volumes. Mild bilateral interstitial opacities. No pleural effusion or pneumothorax. Similar enlarged cardiomediastinal silhouette. No acute osseous abnormality. IMPRESSION: 1. Mild bilateral interstitial opacities, which may represent pulmonary edema. 2. Similar cardiomegaly. Electronically Signed   By: Agustin Cree M.D.   On: 11/02/2022 09:43   CT ABDOMEN PELVIS W CONTRAST  Result Date: 11/01/2022 CLINICAL DATA:  Abdominal pain and flank pain.  Stone suspected EXAM: CT ABDOMEN AND PELVIS WITH CONTRAST TECHNIQUE: Multidetector CT imaging of the abdomen and pelvis was performed using the standard protocol following bolus administration of intravenous contrast. RADIATION DOSE REDUCTION: This exam was performed according to the departmental dose-optimization program which includes automated exposure control, adjustment of the mA and/or kV according to patient size and/or use of iterative reconstruction technique. CONTRAST:  OMNIPAQUE IOHEXOL 300 MG/ML  SOLN COMPARISON:  None Available. FINDINGS: Lower chest: Moderate pleural effusion Hepatobiliary: No focal hepatic lesion. Two tiny gallstones measuring less than 5 mm each. No gallbladder distension. No biliary duct dilatation. Common bile duct is normal. Pancreas: Pancreas is normal. No ductal dilatation. No pancreatic inflammation. Spleen: Normal spleen Adrenals/urinary tract: Adrenal glands normal. No nephrolithiasis or ureterolithiasis. Perinephric stranding similar comparison CT. No renal obstruction on delayed imaging. Bladder normal. Stomach/Bowel: Stomach,  small-bowel and cecum are normal. The appendix is not identified but there is no pericecal inflammation to suggest appendicitis. Insert Vascular/Lymphatic: Abdominal aorta is normal caliber with atherosclerotic calcification. There is no retroperitoneal or periportal lymphadenopathy. No pelvic lymphadenopathy. Reproductive: Prostate normal Other: small volume of free fluid along the LEFT and RIGHT pericolic gutter. Musculoskeletal: No aggressive osseous lesion. IMPRESSION: 1. No nephrolithiasis or ureterolithiasis. 2. Perinephric stranding similar to comparison CT. No renal obstruction on delayed imaging. 3. Moderate pleural effusion. 4. Small volume of free fluid along the LEFT and RIGHT pericolic gutter. 5. Cholelithiasis without evidence of cholecystitis. 6.  Aortic Atherosclerosis (ICD10-I70.0). Electronically Signed   By: Genevive Bi M.D.   On: 11/01/2022 16:45   US SCROTUM W/DOPPLER  Result Date: 11/01/2022 CLINICAL DATA:  Testicular pain EXAM: SCROTAL ULTRASOUND DOPPLER ULTRASOUND OF THE TESTICLES TECHNIQUE: Complete ultrasound examination of the testicles, epididymis, and other scrotal structures was performed. Color and spectral Doppler ultrasound were also utilized to evaluate blood flow to the testicles. COMPARISON:  None Available. FINDINGS: Right testicle Measurements: 3.3 x 1.6 x 2.8 cm. No mass or microlithiasis visualized. Left testicle Measurements: 3.4 x 2.2 x 2.2 cm. No mass or microlithiasis visualized. Right epididymis:  Normal in size and appearance. Left epididymis: Epididymal head cyst with internal echogenic debris measuring 1.1 x 1.0 x 1.0 cm. Hydrocele:  Small left hydrocele. Varicocele:  None visualized. Pulsed  Doppler interrogation of both testes demonstrates normal low resistance arterial and venous waveforms bilaterally. IMPRESSION: 1. Complex left epididymal head cyst measuring up to 1.1 cm. 2. Small left hydrocele. Electronically Signed   By: Allegra Lai M.D.   On:  11/01/2022 11:46   ECHOCARDIOGRAM COMPLETE  Result Date: 10/21/2022    ECHOCARDIOGRAM REPORT   Patient Name:   TULLY MCINTURFF Date of Exam: 10/21/2022 Medical Rec #:  [id number]        Height:       69.0 in Accession #:    [id number]       Weight:       260.0 lb Date of Birth:  May 15, 1955        BSA:          2.310 m Patient Age:    67 years         BP:           104/78 mmHg Patient Gender: M                HR:           74 bpm. Exam [facility] Procedure: 2D Echo, Cardiac Doppler and Color Doppler Indications:    CHF- Acute Systolic I50.21  History:        Patient has prior history of Echocardiogram examinations, most                 recent 11/07/2021. CHF and Cardiomyopathy, Angina and Previous                 Myocardial Infarction, Stroke and PAD, Arrythmias:Tachycardia,                 Signs/Symptoms:Chest Pain; Risk Factors:Hypertension, Sleep                 Apnea, Diabetes and Dyslipidemia. CKD, stage 3.  Sonographer:    Lucendia Herrlich RCS Referring Phys: [phone] CARLOS MADERA  Sonographer Comments: Image acquisition challenging due to uncooperative patient. IMPRESSIONS  1. Left ventricular ejection fraction, by estimation, is 20 to 25%. The left ventricle has severely decreased function. The left ventricle demonstrates global hypokinesis. Left ventricular diastolic parameters are consistent with Grade II diastolic dysfunction (pseudonormalization). Elevated left atrial pressure. There is the interventricular septum is flattened in systole and diastole, consistent with right ventricular pressure and volume overload.  2. RV not well viusalized. Grossly appears mildly enlarged with mildly decreased function. Right ventricular systolic function was not well visualized. The right ventricular size is not well visualized. Tricuspid regurgitation signal is inadequate for assessing PA pressure.  3. Left atrial size was mildly dilated.  4. The mitral valve is abnormal. Mild mitral valve regurgitation.  No evidence of mitral stenosis.  5. The tricuspid valve is abnormal.  6. The aortic valve is tricuspid. There is mild calcification of the aortic valve. There is mild thickening of the aortic valve. Aortic valve regurgitation is not visualized. No aortic stenosis is present.  7. The pulmonic valve was abnormal. FINDINGS  Left Ventricle: Left ventricular ejection fraction, by estimation, is 20 to 25%. The left ventricle has severely decreased function. The left ventricle demonstrates global hypokinesis. The left ventricular internal cavity size was normal in size. There is no left ventricular hypertrophy. The interventricular septum is flattened in systole and diastole, consistent with right ventricular pressure and volume overload. Left ventricular diastolic parameters are consistent with Grade II diastolic dysfunction  (pseudonormalization). Elevated left atrial pressure.  LV Wall  Scoring: The entire apex is akinetic. Right Ventricle: RV not well viusalized. Grossly appears mildly enlarged with mildly decreased function. The right ventricular size is not well visualized. Right vetricular wall thickness was not well visualized. Right ventricular systolic function was not well visualized. Tricuspid regurgitation signal is inadequate for assessing PA pressure. Left Atrium: Left atrial size was mildly dilated. Right Atrium: Right atrial size was normal in size. Pericardium: There is no evidence of pericardial effusion. Mitral Valve: The mitral valve is abnormal. Mild mitral valve regurgitation. No evidence of mitral valve stenosis. Tricuspid Valve: The tricuspid valve is abnormal. Tricuspid valve regurgitation is mild . No evidence of tricuspid stenosis. Aortic Valve: The aortic valve is tricuspid. There is mild calcification of the aortic valve. There is mild thickening of the aortic valve. There is mild aortic valve annular calcification. Aortic valve regurgitation is not visualized. No aortic stenosis  is present.  Aortic valve mean gradient measures 3.4 mmHg. Aortic valve peak gradient measures 6.4 mmHg. Aortic valve area, by VTI measures 2.54 cm. Pulmonic Valve: The pulmonic valve was abnormal. Pulmonic valve regurgitation is mild. No evidence of pulmonic stenosis. Aorta: The aortic root and ascending aorta are structurally normal, with no evidence of dilitation. IAS/Shunts: The interatrial septum was not well visualized.  LEFT VENTRICLE PLAX 2D LVIDd:         5.50 cm   Diastology LVIDs:         4.80 cm   LV e' medial:    5.78 cm/s LV PW:         0.80 cm   LV E/e' medial:  18.5 LV IVS:        1.00 cm   LV e' lateral:   8.93 cm/s LVOT diam:     2.20 cm   LV E/e' lateral: 12.0 LV SV:         56 LV SV Index:   24 LVOT Area:     3.80 cm  RIGHT VENTRICLE RV S prime:     10.20 cm/s TAPSE (M-mode): 1.1 cm LEFT ATRIUM             Index        RIGHT ATRIUM           Index LA diam:        4.90 cm 2.12 cm/m   RA Area:     15.00 cm LA Vol (A2C):   73.6 ml 31.87 ml/m  RA Volume:   34.60 ml  14.98 ml/m LA Vol (A4C):   42.2 ml 18.27 ml/m LA Biplane Vol: 56.9 ml 24.64 ml/m  AORTIC VALVE AV Area (Vmax):    2.77 cm AV Area (Vmean):   2.26 cm AV Area (VTI):     2.54 cm AV Vmax:           126.27 cm/s AV Vmean:          86.342 cm/s AV VTI:            0.222 m AV Peak Grad:      6.4 mmHg AV Mean Grad:      3.4 mmHg LVOT Vmax:         91.95 cm/s LVOT Vmean:        51.233 cm/s LVOT VTI:          0.148 m LVOT/AV VTI ratio: 0.67  AORTA Ao Root diam: 3.20 cm Ao Asc diam:  2.90 cm MITRAL VALVE MV Area (PHT): 4.89 cm     SHUNTS MV  Decel Time: 155 msec     Systemic VTI:  0.15 m MR Peak grad: 34.5 mmHg     Systemic Diam: 2.20 cm MR Vmax:      293.50 cm/s MV E velocity: 107.00 cm/s MV A velocity: 46.50 cm/s MV E/A ratio:  2.30 Dina Rich MD Electronically signed by Dina Rich MD Signature Date/Time: 10/21/2022/3:48:50 PM    Final    DG Chest Portable 1 View  Result Date: 10/21/2022 CLINICAL DATA:  Difficulty breathing.  Feet and  leg swelling. EXAM: PORTABLE CHEST 1 VIEW COMPARISON:  11/12/2021. FINDINGS: Mild diffuse increased interstitial markings. Redemonstration of small right pleural effusion with associated partial obscuration of right medial hemidiaphragm and right heart border, favoring combination of right lung atelectasis and/or consolidation. Bilateral lungs are otherwise clear. Left lateral costophrenic angle is clear. Stable mildly enlarged cardio-mediastinal silhouette. No acute osseous abnormalities. The soft tissues are within normal limits. IMPRESSION: 1. Mild congestive heart failure/pulmonary edema. 2. Stable small right pleural effusion with probable associated right lung atelectasis and/or consolidation. Correlate clinically. 3. Stable mild cardiomegaly. Electronically Signed   By: Jules Schick M.D.   On: 10/21/2022 11:07    Catarina Hartshorn, DO  [facility]  If 7PM-7AM, please contact night-coverage [email or web address] Password [email or web address] 11/11/2022, 5:40 PM   LOS: 8 days

## 2022-11-11 NOTE — Op Note (Addendum)
[facility] Patient Name: [name] Procedure Date: 11/11/2022 10:21 AM MRN: [id number] Date of Birth: Jun 10, 1955 Attending MD: Sanjuan Dame , MD, [id number] CSN: [id number] Age: 67 Admit Type: Inpatient Procedure:                Upper GI endoscopy Indications:              Unexplained iron deficiency anemia Providers:                Sanjuan Dame, MD, Crystal Page, Elinor Parkinson Referring MD:              Medicines:                Monitored Anesthesia Care Complications:            No immediate complications. Estimated Blood Loss:     Estimated blood loss: none. Procedure:                Pre-Anesthesia Assessment:                           - Prior to the procedure, a History and Physical                            was performed, and patient medications and                            allergies were reviewed. The patient's tolerance of                            previous anesthesia was also reviewed. The risks                            and benefits of the procedure and the sedation                            options and risks were discussed with the patient.                            All questions were answered, and informed consent                            was obtained. Prior Anticoagulants: The patient has                            taken no anticoagulant or antiplatelet agents                            except for aspirin. ASA Grade Assessment: III - A                            patient with severe systemic disease. After                            reviewing the risks and benefits, the patient was  deemed in satisfactory condition to undergo the                            procedure.                           After obtaining informed consent, the endoscope was                            passed under direct vision. Throughout the                            procedure, the patient's blood pressure, pulse, and                             oxygen saturations were monitored continuously. The                            [id number]-H190 ([id number]) scope was introduced through the                            mouth, and advanced to the second part of duodenum.                            The upper GI endoscopy was accomplished without                            difficulty. The patient tolerated the procedure                            well. Scope In: 10:50:39 AM Scope Out: 10:55:39 AM Total Procedure Duration: 0 hours 5 minutes 0 seconds  Findings:      The examined esophagus was normal.      Mildly erythematous mucosa without bleeding was found in the gastric       antrum. Biopsies were taken with a cold forceps for histology.      Mild inflammation characterized by congestion (edema) and erythema was       found in the second portion of the duodenum. Biopsies were taken with a       cold forceps for histology. Impression:               - Normal esophagus.                           - Erythematous mucosa in the antrum. Biopsied.                           - Duodenitis. Biopsied. Moderate Sedation:      Per Anesthesia Care Recommendation:           Follow up Path                           Proceed with Colonoscopy                           Avoid NSAIDs except aspirin  Procedure Code(s):        --- Professional ---                           [id number], Esophagogastroduodenoscopy, flexible,                            transoral; with biopsy, single or multiple Diagnosis Code(s):        --- Professional ---                           K31.89, Other diseases of stomach and duodenum                           K29.80, Duodenitis without bleeding                           D50.9, Iron deficiency anemia, unspecified CPT copyright 2022 American [employer] Association. All rights reserved. The codes documented in this report are preliminary and upon coder review may  be revised to meet current compliance requirements. Sanjuan Dame, MD Sanjuan Dame,  MD 11/11/2022 11:00:01 AM This report has been signed electronically. Number of Addenda: 0

## 2022-11-11 NOTE — Anesthesia Preprocedure Evaluation (Addendum)
Anesthesia Evaluation  Patient identified by MRN, date of birth, ID band Patient awake    Reviewed: Allergy & Precautions, H&P , NPO status , Patient's Chart, lab work & pertinent test results, reviewed documented beta blocker date and time   Airway Mallampati: II  TM Distance: >3 FB Neck ROM: full    Dental no notable dental hx. (+) Edentulous Upper, Edentulous Lower   Pulmonary sleep apnea , pneumonia, Current Smoker   Pulmonary exam normal breath sounds clear to auscultation       Cardiovascular Exercise Tolerance: Good hypertension, + angina  + CAD, + Past MI, + Peripheral Vascular Disease and +CHF   Rhythm:regular Rate:Normal     Neuro/Psych  Headaches CVA negative neurological ROS  negative psych ROS   GI/Hepatic negative GI ROS, Neg liver ROS,GERD  ,,  Endo/Other  diabetes    Renal/GU Renal diseasenegative Renal ROS  negative genitourinary   Musculoskeletal   Abdominal   Peds  Hematology negative hematology ROS (+) Blood dyscrasia, anemia   Anesthesia Other Findings Last cocaine use 1wk ago  Reproductive/Obstetrics negative OB ROS                             Anesthesia Physical Anesthesia Plan  ASA: 4 and emergent  Anesthesia Plan: General   Post-op Pain Management:    Induction:   PONV Risk Score and Plan: Propofol infusion  Airway Management Planned:   Additional Equipment:   Intra-op Plan:   Post-operative Plan:   Informed Consent: I have reviewed the patients History and Physical, chart, labs and discussed the procedure including the risks, benefits and alternatives for the proposed anesthesia with the patient or authorized representative who has indicated his/her understanding and acceptance.     Dental Advisory Given  Plan Discussed with: CRNA  Anesthesia Plan Comments:        Anesthesia Quick Evaluation

## 2022-11-11 NOTE — Op Note (Addendum)
[facility] Patient Name: [name] Procedure Date: 11/11/2022 10:20 AM MRN: [id number] Date of Birth: July 06, 1955 Attending MD: Sanjuan Dame , MD, [id number] CSN: [id number] Age: 67 Admit Type: Inpatient Procedure:                Colonoscopy Indications:              Iron deficiency anemia Providers:                Sanjuan Dame, MD, Crystal Page, Elinor Parkinson Referring MD:              Medicines:                Monitored Anesthesia Care Complications:            No immediate complications. Estimated Blood Loss:     Estimated blood loss was minimal. Procedure:                Pre-Anesthesia Assessment:                           - Prior to the procedure, a History and Physical                            was performed, and patient medications and                            allergies were reviewed. The patient's tolerance of                            previous anesthesia was also reviewed. The risks                            and benefits of the procedure and the sedation                            options and risks were discussed with the patient.                            All questions were answered, and informed consent                            was obtained. Prior Anticoagulants: The patient has                            taken no anticoagulant or antiplatelet agents                            except for aspirin. ASA Grade Assessment: III - A                            patient with severe systemic disease. After                            reviewing the risks and benefits, the patient was  deemed in satisfactory condition to undergo the                            procedure.                           After obtaining informed consent, the colonoscope                            was passed under direct vision. Throughout the                            procedure, the patient's blood pressure, pulse, and                            oxygen  saturations were monitored continuously. The                            [id number]) scope was introduced through the                            anus and advanced to the the cecum, identified by                            the ileocecal valve. The colonoscopy was                            technically difficult and complex due to inadequate                            bowel prep and the patient's oxygen desaturation.                            Successful completion of the procedure was aided by                            performing chin lift and administering oxygen. The                            patient tolerated the procedure well. The quality                            of the bowel preparation was evaluated using the                            BBPS [facility] Bowel Preparation Scale) with scores                            of: Right Colon = 1 (portion of mucosa seen, but                            other areas not well seen due to staining, residual  stool and/or opaque liquid), Transverse Colon = 2                            (minor amount of residual staining, small fragments                            of stool and/or opaque liquid, but mucosa seen                            well) and Left Colon = 2 (minor amount of residual                            staining, small fragments of stool and/or opaque                            liquid, but mucosa seen well). The total BBPS score                            equals 5. The quality of the bowel preparation was                            inadequate.                           22 Modifier used given the complexity of the                            procedure as 4 large polyps removed , 2 requiring                            EMR ( endoscopic mucosal resection) with multiple                            intervention ( injection lift, hot snare and                            closure of defect) and long length of procedure 53                             min Scope In: 11:03:37 AM Scope Out: 11:57:34 AM Scope Withdrawal Time: 0 hours 49 minutes 57 seconds  Total Procedure Duration: 0 hours 53 minutes 57 seconds  Findings:      A 10 mm polyp was found in the ascending colon. The polyp was sessile.       The polyp was removed with a hot snare. Resection and retrieval were       complete.      A 25 mm polyp was found in the transverse colon. The polyp was sessile.       Preparations were made for mucosal resection. Demarcation of the lesion       was performed with high-definition white light and narrow band imaging       to clearly identify the boundaries of the lesion. A 0.1 mg/mL solution       of  epinephrine was injected to raise the lesion. Snare mucosal resection       was performed. Resection and retrieval were complete. Resected tissue       margins were examined and clear of polyp tissue. To close a defect after       polypectomy, three hemostatic clips were successfully placed (MR       conditional). Clip manufacturer: [employer]. There was no       bleeding at the end of the procedure.      A 20 mm polyp was found in the transverse colon. The polyp was sessile.       Preparations were made for mucosal resection. Demarcation of the lesion       was performed with high-definition white light and narrow band imaging       to clearly identify the boundaries of the lesion. A 0.1 mg/mL solution       of epinephrine was injected to raise the lesion. Snare mucosal resection       was performed. Resection and retrieval were complete. Resected tissue       margins were examined and clear of polyp tissue. To close a defect after       polypectomy, three hemostatic clips were successfully placed (MR       conditional). Clip manufacturer: [employer]. There was no       bleeding at the end of the procedure. Area was tattooed with an       injection of 2 mL of Spot (carbon black).      A 10 mm polyp was  found in the transverse colon. The polyp was sessile.       The polyp was removed with a hot snare. Resection and retrieval were       complete.      A moderate amount of stool was found in the entire colon, precluding       visualization. Lavage of the area was performed using a large amount of       sterile water, resulting in incomplete clearance with fair visualization.      Non-bleeding external and internal hemorrhoids were found during       retroflexion. The hemorrhoids were medium-sized. Impression:               - Preparation of the colon was inadequate.                           - One 10 mm polyp in the ascending colon, removed                            with a hot snare. Resected and retrieved.                           - One 25 mm polyp in the transverse colon, removed                            with mucosal resection. Resected and retrieved.                            Clips (MR conditional) were placed. Clip  manufacturer: [employer].                           - One 20 mm polyp in the transverse colon, removed                            with mucosal resection. Resected and retrieved.                            Clips (MR conditional) were placed. Clip                            manufacturer: [employer]. Tattooed.                           - One 10 mm polyp in the transverse colon, removed                            with a hot snare. Resected and retrieved.                           - Stool in the entire examined colon.                           - Non-bleeding external and internal hemorrhoids.                           - Mucosal resection was performed. Resection and                            retrieval were complete.                           - Mucosal resection was performed. Resection and                            retrieval were complete. Moderate Sedation:      Per Anesthesia Care Recommendation:           - Repeat colonoscopy at  next available appointment                            (within 3 months) because the bowel preparation was                            poor.                           - Resume previous diet.                           - Await pathology results.                           - No ibuprofen, naproxen, or other non-steroidal  anti-inflammatory drugs for 10 days after polyp                            removal.                           -Likely cause of iron deficiency was large polyps                            and one of them with visible oozing .                           -Start PO iron q 48 hours and consider IV iron                            loading while inpatient Procedure Code(s):        --- Professional ---                           [id number], 22, Colonoscopy, flexible; with endoscopic                            mucosal resection                           45385, 59, Colonoscopy, flexible; with removal of                            tumor(s), polyp(s), or other lesion(s) by snare                            technique Diagnosis Code(s):        --- Professional ---                           K64.8, Other hemorrhoids                           D12.2, Benign neoplasm of ascending colon                           D12.3, Benign neoplasm of transverse colon (hepatic                            flexure or splenic flexure)                           D50.9, Iron deficiency anemia, unspecified CPT copyright 2022 American Medical Association. All rights reserved. The codes documented in this report are preliminary and upon coder review may  be revised to meet current compliance requirements. Sanjuan Dame, MD Sanjuan Dame, MD 11/11/2022 12:11:40 PM This report has been signed electronically. Number of Addenda: 0

## 2022-11-12 DIAGNOSIS — F17200 Nicotine dependence, unspecified, uncomplicated: Secondary | ICD-10-CM | POA: Diagnosis not present

## 2022-11-12 DIAGNOSIS — I5023 Acute on chronic systolic (congestive) heart failure: Secondary | ICD-10-CM | POA: Diagnosis not present

## 2022-11-12 DIAGNOSIS — J9601 Acute respiratory failure with hypoxia: Secondary | ICD-10-CM | POA: Diagnosis not present

## 2022-11-12 DIAGNOSIS — N1831 Chronic kidney disease, stage 3a: Secondary | ICD-10-CM | POA: Diagnosis not present

## 2022-11-12 LAB — CBC
HCT: 25.7 % — ABNORMAL LOW (ref 39.0–52.0)
Hemoglobin: 7.7 g/dL — ABNORMAL LOW (ref 13.0–17.0)
MCH: 23.2 pg — ABNORMAL LOW (ref 26.0–34.0)
MCHC: 30 g/dL (ref 30.0–36.0)
MCV: 77.4 fL — ABNORMAL LOW (ref 80.0–100.0)
Platelets: 249 10*3/uL (ref 150–400)
RBC: 3.32 MIL/uL — ABNORMAL LOW (ref 4.22–5.81)
RDW: 21.8 % — ABNORMAL HIGH (ref 11.5–15.5)
WBC: 13.9 10*3/uL — ABNORMAL HIGH (ref 4.0–10.5)
nRBC: 0.4 % — ABNORMAL HIGH (ref 0.0–0.2)

## 2022-11-12 LAB — GLUCOSE, CAPILLARY
Glucose-Capillary: 321 mg/dL — ABNORMAL HIGH (ref 70–99)
Glucose-Capillary: 419 mg/dL — ABNORMAL HIGH (ref 70–99)
Glucose-Capillary: 197 mg/dL — ABNORMAL HIGH (ref 70–99)
Glucose-Capillary: 138 mg/dL — ABNORMAL HIGH (ref 70–99)

## 2022-11-12 LAB — BASIC METABOLIC PANEL
Anion gap: 9 (ref 5–15)
BUN: 55 mg/dL — ABNORMAL HIGH (ref 8–23)
CO2: 23 mmol/L (ref 22–32)
Calcium: 8.1 mg/dL — ABNORMAL LOW (ref 8.9–10.3)
Chloride: 98 mmol/L (ref 98–111)
Creatinine, Ser: 2 mg/dL — ABNORMAL HIGH (ref 0.61–1.24)
GFR, Estimated: 36 mL/min — ABNORMAL LOW (ref >60)
Glucose, Bld: 361 mg/dL — ABNORMAL HIGH (ref 70–99)
Potassium: 4.3 mmol/L (ref 3.5–5.1)
Sodium: 130 mmol/L — ABNORMAL LOW (ref 135–145)

## 2022-11-12 LAB — PREPARE RBC (CROSSMATCH)

## 2022-11-12 LAB — HEMOGLOBIN AND HEMATOCRIT, BLOOD
HCT: 30.5 % — ABNORMAL LOW (ref 39.0–52.0)
Hemoglobin: 9.2 g/dL — ABNORMAL LOW (ref 13.0–17.0)

## 2022-11-12 MED ORDER — INSULIN ASPART 100 UNIT/ML IJ SOLN
35.0000 [IU] | Freq: Once | INTRAMUSCULAR | Status: AC
  Administered 2022-11-12: 35 [IU] via SUBCUTANEOUS

## 2022-11-12 MED ORDER — THIAMINE MONONITRATE 100 MG PO TABS
100.0000 mg | ORAL_TABLET | Freq: Every day | ORAL | Status: DC
  Administered 2022-11-13 – 2022-11-15 (×3): 100 mg via ORAL
  Filled 2022-11-12 (×3): qty 1

## 2022-11-12 MED ORDER — INSULIN GLARGINE-YFGN 100 UNIT/ML ~~LOC~~ SOLN
10.0000 [IU] | Freq: Every day | SUBCUTANEOUS | Status: DC
  Administered 2022-11-12: 10 [IU] via SUBCUTANEOUS
  Filled 2022-11-12 (×3): qty 0.1

## 2022-11-12 MED ORDER — SODIUM CHLORIDE 0.9% IV SOLUTION: Freq: Once | INTRAVENOUS | Status: AC

## 2022-11-12 MED ORDER — FERROUS SULFATE 325 (65 FE) MG PO TABS
325.0000 mg | ORAL_TABLET | Freq: Every day | ORAL | Status: DC
  Administered 2022-11-13 – 2022-11-15 (×3): 325 mg via ORAL
  Filled 2022-11-12 (×3): qty 1

## 2022-11-12 NOTE — Progress Notes (Addendum)
PROGRESS NOTE  [name] KGM:[id number] DOB: [birth year] DOA: 11/02/2022 PCP: Benetta Spar, MD  Brief History:   67 y.o. male with a history of CAD, tobacco use, HTN, HLD, T2DM, GERD, history of cocaine use, OSA, and combined CHF (LVEF 20-25%, G2DD) who presented to the ED 10/23 with migratory abdominal/back/pelvic pain and dyspnea. He was admitted for recurrent acute on chronic combined CHF, [place] +cocaine. IV diuresis is underway with cardiology consultation. Developed delirium 10/25, head CT nonacute. Mentation has cleared up a bit but not at baseline. CBG shows no new hypercarbia. MR brain shows chronic meningioma with edema without midline shift.  Hospitalization has been prolonged secondary to fluid overload requiring aggressive diuresis as well as drop in hemoglobin.  Patient was transfused 2 unit PRBC total for the hospitalization .  GI was consulted assist with management.  colonoscopy--3 polyps in transverse colon, one in ascending colon;  iron deficiency due to large polyps with one with visible oozing. Slow response to diuresis in part due to patient's dietary indiscretion as he ordered outside food.   Assessment/Plan: Acute hypoxic respiratory failure due to acute on chronic HFrEF, pleural effusions:  - initially on 2L - oxygenation improved with oxygenation - Continue to stress incentive spirometry, ambulate the patient as atelectasis is contributing. -now stable on RA, but uses oxygen for comfort -11/02/22 CTA chest--no dissection; +pleural eff, +pulmonary edema -11/02/22 CTA abd--chronic occlusion of left common iliac with reconstitution of left fem artery;  no acute intraabdominal process   Acute metabolic encephalopathy, delirium:  -Possibly element of hospital delirium and/or cocaine withdrawal.  - CT head >> no acute findings, though chronic microvascular disease and meningioma decrease threshold for confusion.  - LFT's/ammonia wnl.  - 10/28 VBG  1.28/53/<31/29 - No urinary retention noted per RN - 10/27 MR brain--slight interval growth of meningioma with progressive edema of R-temporal lobe - Keep delirium precautions and minimize sedating medications if able.  - Mentation improving after 2 days of dexamethasone -10/31 UA/culture--no pyuria -B12--576, folate 7.4 -TSH 1.349 -Ammonia - <10   CKD 3a -baseline creatinine 1.4-1.7 -monitor with diuresis   Acute on chronic combined HFrEF and RV failure:  - Unclear whether he was taking cardiac medications PTA, though he reports he was. Given severity of biventricular failure, cardiology consulted for advice. We will optimize volume status then discharge.  - Monitor I/O (actually turned a positive balance when PM lasix dose held), ReDS vest >40% on 10/28  - remains clinically fluid overloaded>>continued IV lasix - Given cocaine positivity, stopped beta blocker initially -10/21/22 Echo--EF 20-25%, G2DD, mild MR -transitioned to po torsemide 10/31     Meningioma: Extra-axial brain mass known since 2023. Had mediastinal lymphadenopathy and pleural effusion sampled during that admission which did not show malignancy. Plan per neurosurgery was to biopsy brain mass if those were negative.  - Given persistent encephalopathy and cerebral edema suggested by CT, we checked MRI w/ and w/o contrast which shows increased size in meningioma with chronic cerebral edema. Dr. Jarvis Newcomer spoke with Dr. Maurice Small who initially saw this patient. He reviewed updated images and history. Recommendation is for outpatient follow up, though with small size and appearance so characteristic of meningioma, no intervention is anticipated. Given the confusion and cerebral edema, he was ok with short trial of decadron which we have pursued.    Cocaine abuse: +UDS - Cessation counseling   Acute on Chronic microcytic anemia:  -Hgb 10>>7.4 though no gross  bleeding is noted. BUN up, though likely due to steroids and pt is on  PPI.   - iron saturation 8%, ferritin 13 -foalte 7.4, B12 576 - Increase PPI to BID, check FOBT. Only record of GI evaluation was colonoscopy 2012 showing 2 sigmoid polyps, external hemorrhoids.  - Ok to continue lovenox VTE ppx for now unless bleeding is noted. -GI consult appreciated -10/30--transfuse one unit PRBC -11/2--transfuse one unit PRBC -11/1 EGD--ertythematous mucosa in antrum, +duodenitis -11/1 colonoscopy--3 polyps in transverse colon, one in ascending colon;  iron deficiency due to large polyps with one with visible oozing -11/1--give venofer x 1 -start ferrous sulfate in 11/2   Scrotal pain, history of Fournier's gangrene: U/S reassuring (stable epididymal cyst), ?if hydrocele from overload contributing. Also note Dx from PCP "scrotal pain, chronic, bilateral" a year ago.  - Will discontinue SGLT2i permanently. - Supportive care, urology follow up.   OSA:  - CPAP at bedtime   Peripheral neuropathy:  - Continue gabapentin   CAD, HTN: No chest pain. Later did have atypical chest pain likely musculoskeletal with persistently negative troponin and no ischemic ECG changes.  - Continue ASA, statin, BB   Cellulitis: Localized induration in areas where he may have injured the skin. No fluctuance/indication for I&D at this time.  - Treating with keflex.  - Also has likely dermal inclusion cyst on left posterior neck that does not appear infected.    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  - Needs vascular surgery evaluation as outpatient.      T2DM:  - SSI.  -10/21/22 HbA1c was 6.3%, seeing some steroid-induced hyperglycemia  -change to resistant SSI  -add low dose semglee and premeal insulin   Tobacco use:  - Nicotine patch - Cessation counseling   Alcohol dependence -start high dose thiamine -may have a degree of Wernicke-Korsakoff syndrome      Family Communication:   daughter updated 11/1   Consultants:  cardiology, GI    Code Status:  FULL    DVT Prophylaxis:   [place] Lovenox     Procedures: As Listed in Progress Note Above   Antibiotics: cephalexin       Subjective:  Patient denies fevers, chills, headache, chest pain, dyspnea, nausea, vomiting, diarrhea, abdominal pain, dysuria, hematuria,   Objective: Vitals:   11/12/22 0918 11/12/22 1040 11/12/22 1113 11/12/22 1400  BP: 99/66 92/63 (!) 100/56 115/79  Pulse: 81 72 81 78  Resp: 16 16 15 17   Temp:  (!) 97.5 F (36.4 C) 97.8 F (36.6 C) 97.8 F (36.6 C)  TempSrc:  Axillary Axillary Oral  SpO2: 100% 100% 100% 100%  Weight:      Height:        Intake/Output Summary (Last 24 hours) at 11/12/2022 1728 Last data filed at 11/12/2022 1400 Gross per 24 hour  Intake 1734 ml  Output --  Net 1734 ml   Weight change: 0 kg Exam:  General:  Pt is alert, follows commands appropriately, not in acute distress HEENT: No icterus, No thrush, No neck mass, [place]/AT Cardiovascular: RRR, S1/S2, no rubs, no gallops Respiratory: fine bibasilar crackles.  No wheeze Abdomen: Soft/+BS, non tender, non distended, no guarding Extremities: 1 + LE edema, No lymphangitis, No petechiae, No rashes, no synovitis   Data Reviewed: I have personally reviewed following labs and imaging studies Basic Metabolic Panel: Recent Labs  Lab 11/08/22 0448 11/09/22 0503 11/10/22 0448 11/11/22 0414 11/12/22 0448  NA 132* 133* 132* 133* 130*  K 4.6 4.4 4.3 4.4 4.3  CL 98 98 95* 98 98  CO2 27 27 26 28 23   GLUCOSE 262* 201* 266* 119* 361*  BUN 38* 43* 52* 49* 55*  CREATININE 1.52* 1.61* 1.69* 1.60* 2.00*  CALCIUM 8.4* 8.5* 8.6* 8.4* 8.1*  MG  --   --  2.3  --   --    Liver Function Tests: Recent Labs  Lab 11/11/22 0414  AST 67*  ALT 52*  ALKPHOS 102  BILITOT 0.8  PROT 7.3  ALBUMIN 2.7*   No results for input(s): "LIPASE", "AMYLASE" in the last 168 hours. Recent Labs  Lab 11/10/22 2128  AMMONIA <10   Coagulation Profile: No results for input(s): "INR",  "PROTIME" in the last 168 hours. CBC: Recent Labs  Lab 11/08/22 0448 11/09/22 0503 11/10/22 0448 11/11/22 0414 11/12/22 0448 11/12/22 1634  WBC 9.8 12.8* 16.0* 12.5* 13.9*  --   NEUTROABS  --   --   --  10.8*  --   --   HGB 7.8* 7.4* 9.0* 8.1* 7.7* 9.2*  HCT 26.8* 25.4* 30.4* 27.9* 25.7* 30.5*  MCV 77.9* 76.7* 77.6* 77.5* 77.4*  --   PLT 252 277 304 261 249  --    Cardiac Enzymes: No results for input(s): "CKTOTAL", "CKMB", "CKMBINDEX", "TROPONINI" in the last 168 hours. BNP: Invalid input(s): "POCBNP" CBG: Recent Labs  Lab 11/11/22 1643 11/11/22 2108 11/12/22 0741 11/12/22 1121 11/12/22 1641  GLUCAP 367* 447* 321* 419* 197*   HbA1C: No results for input(s): "HGBA1C" in the last 72 hours. Urine analysis:    Component Value Date/Time   COLORURINE STRAW (A) 11/10/2022 2008   APPEARANCEUR CLEAR 11/10/2022 2008   APPEARANCEUR Clear 08/02/2021 1051   LABSPEC 1.006 11/10/2022 2008   PHURINE 5.0 11/10/2022 2008   GLUCOSEU NEGATIVE 11/10/2022 2008   HGBUR NEGATIVE 11/10/2022 2008   BILIRUBINUR NEGATIVE 11/10/2022 2008   BILIRUBINUR Negative 08/02/2021 1051   KETONESUR NEGATIVE 11/10/2022 2008   PROTEINUR NEGATIVE 11/10/2022 2008   UROBILINOGEN 0.2 09/27/2014 2237   NITRITE NEGATIVE 11/10/2022 2008   LEUKOCYTESUR NEGATIVE 11/10/2022 2008   Sepsis Labs: @LABRCNTIP (procalcitonin:4,lacticidven:4) )No results found for this or any previous visit (from the past 240 hour(s)).   Scheduled Meds:  aspirin EC  81 mg Oral Daily   atorvastatin  40 mg Oral QHS   cephALEXin  500 mg Oral Q8H   enoxaparin (LOVENOX) injection  40 mg Subcutaneous Q24H   [START ON 11/13/2022] ferrous sulfate  325 mg Oral Q breakfast   folic acid  1 mg Oral Daily   gabapentin  300 mg Oral TID   insulin aspart  0-20 Units Subcutaneous TID WC   insulin aspart  0-5 Units Subcutaneous QHS   insulin aspart  5 Units Subcutaneous TID WC   insulin glargine-yfgn  10 Units Subcutaneous Daily   lidocaine  1  patch Transdermal Q24H   linaclotide  145 mcg Oral QAC breakfast   nicotine  21 mg Transdermal Daily   pantoprazole  40 mg Oral BID   polyethylene glycol-electrolytes  4,000 mL Oral Once   sodium chloride flush  3 mL Intravenous Q12H   [START ON 11/13/2022] thiamine  100 mg Oral Daily   Continuous Infusions:  Procedures/Studies: US Abdomen Limited RUQ (LIVER/GB)  Result Date: 11/11/2022 CLINICAL DATA:  Abdominal pain.  Transaminitis EXAM: ULTRASOUND ABDOMEN LIMITED RIGHT UPPER QUADRANT COMPARISON:  CT 11/01/2022. FINDINGS: Gallbladder: Mildly distended gallbladder. Dependent stones. No adjacent fluid. Slightly thickened wall of up to 4 mm.  No reported sonographic Murphy's sign. Common bile duct: Diameter: 3 mm Liver: No focal lesion identified. Within normal limits in parenchymal echogenicity. Portal vein is patent on color Doppler imaging with normal direction of blood flow towards the liver. Other: None. IMPRESSION: Gallbladder mildly distended with stones. Slight wall thickening. No ductal dilatation. Please correlate for other clinical presentation of acute cholecystitis and if needed confirmatory HIDA scan as clinically appropriate Electronically Signed   By: Karen Kays M.D.   On: 11/11/2022 13:56   DG CHEST PORT 1 VIEW  Result Date: 11/07/2022 CLINICAL DATA:  Acute respiratory failure, hypoxia EXAM: PORTABLE CHEST 1 VIEW COMPARISON:  11/02/2022 FINDINGS: Single frontal view of the chest demonstrates an enlarged cardiac silhouette. Mild pulmonary vascular congestion, with minimal bibasilar airspace disease left greater than right. Trace left effusions are suspected. No pneumothorax. No acute bony abnormalities. IMPRESSION: 1. Findings consistent with mild congestive heart failure. Electronically Signed   By: Sharlet Salina M.D.   On: 11/07/2022 15:42   MR BRAIN W WO CONTRAST  Result Date: 11/06/2022 CLINICAL DATA:  Meningioma follow-up. EXAM: MRI HEAD WITHOUT AND WITH CONTRAST TECHNIQUE:  Multiplanar, multiecho pulse sequences of the brain and surrounding structures were obtained without and with intravenous contrast. CONTRAST:  10mL GADAVIST GADOBUTROL 1 MMOL/ML IV SOLN COMPARISON:  CT head without contrast 11/05/2022. Head without and with contrast 11/08/2021. FINDINGS: Brain: A meningioma along the medial aspect of the right middle cranial fossa demonstrates slight interval growth now measuring 3.0 x 1.9 x 2.1 cm. The meningioma does not extend into the right cavernous sinus and is separate from the right orbital apex. Progressive edema is present throughout the right temporal lobe. A remote infarct is present in the right occipital lobe. Moderate generalized atrophy and white matter disease is present bilaterally. The ventricles are proportionate to the degree of atrophy. No significant extraaxial fluid collection is present. Postcontrast images demonstrate no other pathologic enhancement. White matter changes extend into the brainstem. Remote lacunar infarcts are present in the cerebellum bilaterally. The internal auditory canals are within normal limits. Midline structures are within normal limits. Vascular: Flow is present in the major intracranial arteries. Skull and upper cervical spine: Degenerative changes are present in the upper cervical spine. The craniocervical junction is normal. Marrow signal is normal. Sinuses/Orbits: Bilateral mastoid effusions are present. No obstructing nasopharyngeal lesion is present. The paranasal sinuses and mastoid air cells are otherwise clear. IMPRESSION: 1. Slight interval growth of meningioma along the medial aspect of the right middle cranial fossa now measuring 3.0 x 1.9 x 2.1 cm. 2. Progressive edema throughout the right temporal lobe. 3. Remote infarct of the right occipital lobe. 4. Remote lacunar infarcts of the cerebellum bilaterally. 5. Moderate generalized atrophy and white matter disease likely reflects the sequela of chronic microvascular  ischemia. 6. Bilateral mastoid effusions. No obstructing nasopharyngeal lesion is present. Electronically Signed   By: Marin Roberts M.D.   On: 11/06/2022 19:44   CT HEAD WO CONTRAST ( )  Result Date: 11/05/2022 CLINICAL DATA:  67 year old male altered mental status. EXAM: CT HEAD WITHOUT CONTRAST TECHNIQUE: Contiguous axial images were obtained from the base of the skull through the vertex without intravenous contrast. RADIATION DOSE REDUCTION: This exam was performed according to the departmental dose-optimization program which includes automated exposure control, adjustment of the mA and/or kV according to patient size and/or use of iterative reconstruction technique. COMPARISON:  Brain MRI 11/08/2021.  Head CT 11/07/2021. FINDINGS: Brain: Chronic abnormal hypodensity in the right temporal lobe tracking  into the deep white matter capsules, most resembles vasogenic edema. Chronic enhancing mass at the mesial right temporal lobe, lateral to the cavernous sinus on MRI last year, occult by plain CT. Stable underlying cerebral volume and superimposed chronic right PCA territory encephalomalacia. Stable chronic small vessel disease, patchy additional white matter and thalamic involvement. No midline shift or new intracranial mass effect. No acute cortically based infarct. No acute intracranial hemorrhage identified. Vascular: Calcified atherosclerosis at the skull base. No suspicious intracranial vascular hyperdensity. Skull: No acute osseous abnormality identified. Sinuses/Orbits: Visualized paranasal sinuses and mastoids are stable and well aerated. Other: Visualized orbits and scalp soft tissues are within normal limits. IMPRESSION: 1. Chronically abnormal right middle cranial fossa where vasogenic edema in the right temporal lobe appears related to enhancing mass lateral to the right cavernous sinus on MRI last year (occult by CT). Stability and time course favor a Meningioma with chronic cerebral  edema. No midline shift or progression by noncontrast CT. 2. No acute intracranial abnormality. Underlying chronic right PCA territory infarct. Electronically Signed   By: Odessa Fleming M.D.   On: 11/05/2022 12:36   DG Abd 1 View  Result Date: 11/03/2022 CLINICAL DATA:  Abdominal pain.  Constipation. EXAM: ABDOMEN - 1 VIEW COMPARISON:  CTA yesterday.  Abdominopelvic CT 2 days ago FINDINGS: Bilateral pleural effusions. Cardiomegaly. Moderate volume of colonic stool. No bowel obstruction. There are vascular calcifications. Excreted IV contrast in the urinary bladder. IMPRESSION: 1. Moderate colonic stool burden. No bowel obstruction. 2. Bilateral pleural effusions. Cardiomegaly. Electronically Signed   By: Narda Rutherford M.D.   On: 11/03/2022 23:40   CT Angio Chest/Abd/Pel for Dissection W and/or Wo Contrast  Result Date: 11/02/2022 CLINICAL DATA:  Chest pain.  Acute aortic syndrome EXAM: CT ANGIOGRAPHY CHEST, ABDOMEN AND PELVIS TECHNIQUE: Non-contrast CT of the chest was initially obtained. Multidetector CT imaging through the chest, abdomen and pelvis was performed using the standard protocol during bolus administration of intravenous contrast. Multiplanar reconstructed images and MIPs were obtained and reviewed to evaluate the vascular anatomy. RADIATION DOSE REDUCTION: This exam was performed according to the departmental dose-optimization program which includes automated exposure control, adjustment of the mA and/or kV according to patient size and/or use of iterative reconstruction technique. CONTRAST:  OMNIPAQUE IOHEXOL 350 MG/ML SOLN COMPARISON:  CT chest abdomen pelvis 11/07/2021 CT abdomen 11/01/2022 FINDINGS: CTA CHEST FINDINGS Cardiovascular: Noncontrast series demonstrates no intramural hematoma within the thoracic aorta. Contrast series demonstrates no aortic dissection or aneurysm. Great vessels normal. No pericardial fluid. Mediastinum/Nodes: Enlarged mediastinal lymph nodes are not changed  from CT 1 year prior. For example LEFT lower paratracheal lymph node measuring 17 mm compared to 16 mm. RIGHT lower paratracheal node measuring 16 mm compares to 20 mm. Lungs/Pleura: Bilateral pleural effusions. Moderate effusion on the RIGHT and small effusion on the LEFT. Mild diffuse ground-glass density in the lungs suggest mild pulmonary edema. Mild interstitial thickening chest mild interstitial edema. No pneumothorax. No pneumonia. Musculoskeletal: No aggressive osseous lesion. Review of the MIP images confirms the above findings. CTA ABDOMEN AND PELVIS FINDINGS VASCULAR Aorta: Normal caliber aorta without aneurysm, dissection, vasculitis or significant stenosis. Celiac: Patent without evidence of aneurysm, dissection, vasculitis or significant stenosis. SMA: Patent without evidence of aneurysm, dissection, vasculitis or significant stenosis. Renals: Both renal arteries are patent without evidence of aneurysm, dissection, vasculitis, fibromuscular dysplasia or significant stenosis. IMA: Patent without evidence of aneurysm, dissection, vasculitis or significant stenosis. 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 Reconstitution of  the LEFT femoral artery. No interval change. Veins: No obvious venous abnormality within the limitations of this arterial phase study. Review of the MIP images confirms the above findings. NON-VASCULAR Lower chest: Lung bases are clear. Hepatobiliary: No focal hepatic lesion. Normal gallbladder. No biliary duct dilatation. Common bile duct is normal. Pancreas: Pancreas is normal. No ductal dilatation. No pancreatic inflammation. Spleen: Normal spleen Adrenals/urinary tract: Adrenal glands and kidneys are normal. The ureters and bladder normal. Stomach/Bowel: Stomach, small bowel, appendix, and cecum are normal. The colon and rectosigmoid colon are normal. Vascular/Lymphatic: Abdominal aorta is normal caliber. No periportal or retroperitoneal adenopathy. No pelvic  adenopathy. Reproductive: Prostate unremarkable Other: No free fluid. Musculoskeletal: No aggressive osseous lesion. Review of the MIP images confirms the above findings. IMPRESSION: CHEST: 1. No evidence of aortic dissection or aneurysm. 2. Bilateral pleural effusions and mild pulmonary edema. 3. Stable mediastinal adenopathy. PELVIS: 1. No evidence of aortic dissection or aneurysm. 2. Chronic occlusion of the LEFT common iliac artery with reconstitution of the LEFT femoral artery. 3. No acute findings in the abdomen pelvis. Electronically Signed   By: Genevive Bi M.D.   On: 11/02/2022 11:25   DG Chest Portable 1 View  Result Date: 11/02/2022 CLINICAL DATA:  Shortness of breath EXAM: PORTABLE CHEST 1 VIEW COMPARISON:  Chest radiograph dated 10/21/2022 FINDINGS: Normal lung volumes. Mild bilateral interstitial opacities. No pleural effusion or pneumothorax. Similar enlarged cardiomediastinal silhouette. No acute osseous abnormality. IMPRESSION: 1. Mild bilateral interstitial opacities, which may represent pulmonary edema. 2. Similar cardiomegaly. Electronically Signed   By: Agustin Cree M.D.   On: 11/02/2022 09:43   CT ABDOMEN PELVIS W CONTRAST  Result Date: 11/01/2022 CLINICAL DATA:  Abdominal pain and flank pain.  Stone suspected EXAM: CT ABDOMEN AND PELVIS WITH CONTRAST TECHNIQUE: Multidetector CT imaging of the abdomen and pelvis was performed using the standard protocol following bolus administration of intravenous contrast. RADIATION DOSE REDUCTION: This exam was performed according to the departmental dose-optimization program which includes automated exposure control, adjustment of the mA and/or kV according to patient size and/or use of iterative reconstruction technique. CONTRAST:  OMNIPAQUE IOHEXOL 300 MG/ML  SOLN COMPARISON:  None Available. FINDINGS: Lower chest: Moderate pleural effusion Hepatobiliary: No focal hepatic lesion. Two tiny gallstones measuring less than 5 mm each. No  gallbladder distension. No biliary duct dilatation. Common bile duct is normal. Pancreas: Pancreas is normal. No ductal dilatation. No pancreatic inflammation. Spleen: Normal spleen Adrenals/urinary tract: Adrenal glands normal. No nephrolithiasis or ureterolithiasis. Perinephric stranding similar comparison CT. No renal obstruction on delayed imaging. Bladder normal. Stomach/Bowel: Stomach, small-bowel and cecum are normal. The appendix is not identified but there is no pericecal inflammation to suggest appendicitis. Insert Vascular/Lymphatic: Abdominal aorta is normal caliber with atherosclerotic calcification. There is no retroperitoneal or periportal lymphadenopathy. No pelvic lymphadenopathy. Reproductive: Prostate normal Other: small volume of free fluid along the LEFT and RIGHT pericolic gutter. Musculoskeletal: No aggressive osseous lesion. IMPRESSION: 1. No nephrolithiasis or ureterolithiasis. 2. Perinephric stranding similar to comparison CT. No renal obstruction on delayed imaging. 3. Moderate pleural effusion. 4. Small volume of free fluid along the LEFT and RIGHT pericolic gutter. 5. Cholelithiasis without evidence of cholecystitis. 6.  Aortic Atherosclerosis (ICD10-I70.0). Electronically Signed   By: Genevive Bi M.D.   On: 11/01/2022 16:45   US SCROTUM W/DOPPLER  Result Date: 11/01/2022 CLINICAL DATA:  Testicular pain EXAM: SCROTAL ULTRASOUND DOPPLER ULTRASOUND OF THE TESTICLES TECHNIQUE: Complete ultrasound examination of the testicles, epididymis, and other scrotal structures was performed.  Color and spectral Doppler ultrasound were also utilized to evaluate blood flow to the testicles. COMPARISON:  None Available. FINDINGS: Right testicle Measurements: 3.3 x 1.6 x 2.8 cm. No mass or microlithiasis visualized. Left testicle Measurements: 3.4 x 2.2 x 2.2 cm. No mass or microlithiasis visualized. Right epididymis:  Normal in size and appearance. Left epididymis: Epididymal head cyst with  internal echogenic debris measuring 1.1 x 1.0 x 1.0 cm. Hydrocele:  Small left hydrocele. Varicocele:  None visualized. Pulsed Doppler interrogation of both testes demonstrates normal low resistance arterial and venous waveforms bilaterally. IMPRESSION: 1. Complex left epididymal head cyst measuring up to 1.1 cm. 2. Small left hydrocele. Electronically Signed   By: Allegra Lai M.D.   On: 11/01/2022 11:46   ECHOCARDIOGRAM COMPLETE  Result Date: 10/21/2022    ECHOCARDIOGRAM REPORT   Patient Name:   [name] Date of Exam: 10/21/2022 Medical Rec #:  [id number]        Height:       69.0 in Accession #:    [id number]       Weight:       260.0 lb Date of Birth:  02/27/1955        BSA:          2.310 m Patient Age:    67 years         BP:           104/78 mmHg Patient Gender: M                HR:           74 bpm. [facility]am [facility] Procedure: 2D Echo, Cardiac Doppler and Color Doppler Indications:    CHF- Acute Systolic I50.21  History:        Patient has prior history of Echocardiogram examinations, most                 recent 11/07/2021. CHF and Cardiomyopathy, Angina and Previous                 Myocardial Infarction, Stroke and PAD, Arrythmias:Tachycardia,                 Signs/Symptoms:Chest Pain; Risk Factors:Hypertension, Sleep                 Apnea, Diabetes and Dyslipidemia. CKD, stage 3.  Sonographer:    Lucendia Herrlich RCS Referring Phys: [phone] CARLOS MADERA  Sonographer Comments: Image acquisition challenging due to uncooperative patient. IMPRESSIONS  1. Left ventricular ejection fraction, by estimation, is 20 to 25%. The left ventricle has severely decreased function. The left ventricle demonstrates global hypokinesis. Left ventricular diastolic parameters are consistent with Grade II diastolic dysfunction (pseudonormalization). Elevated left atrial pressure. There is the interventricular septum is flattened in systole and diastole, consistent with right ventricular pressure and volume  overload.  2. RV not well viusalized. Grossly appears mildly enlarged with mildly decreased function. Right ventricular systolic function was not well visualized. The right ventricular size is not well visualized. Tricuspid regurgitation signal is inadequate for assessing PA pressure.  3. Left atrial size was mildly dilated.  4. The mitral valve is abnormal. Mild mitral valve regurgitation. No evidence of mitral stenosis.  5. The tricuspid valve is abnormal.  6. The aortic valve is tricuspid. There is mild calcification of the aortic valve. There is mild thickening of the aortic valve. Aortic valve regurgitation is not visualized. No aortic stenosis is present.  7. The pulmonic valve  was abnormal. FINDINGS  Left Ventricle: Left ventricular ejection fraction, by estimation, is 20 to 25%. The left ventricle has severely decreased function. The left ventricle demonstrates global hypokinesis. The left ventricular internal cavity size was normal in size. There is no left ventricular hypertrophy. The interventricular septum is flattened in systole and diastole, consistent with right ventricular pressure and volume overload. Left ventricular diastolic parameters are consistent with Grade II diastolic dysfunction  (pseudonormalization). Elevated left atrial pressure.  LV Wall Scoring: The entire apex is akinetic. Right Ventricle: RV not well viusalized. Grossly appears mildly enlarged with mildly decreased function. The right ventricular size is not well visualized. Right vetricular wall thickness was not well visualized. Right ventricular systolic function was not well visualized. Tricuspid regurgitation signal is inadequate for assessing PA pressure. Left Atrium: Left atrial size was mildly dilated. Right Atrium: Right atrial size was normal in size. Pericardium: There is no evidence of pericardial effusion. Mitral Valve: The mitral valve is abnormal. Mild mitral valve regurgitation. No evidence of mitral valve stenosis.  Tricuspid Valve: The tricuspid valve is abnormal. Tricuspid valve regurgitation is mild . No evidence of tricuspid stenosis. Aortic Valve: The aortic valve is tricuspid. There is mild calcification of the aortic valve. There is mild thickening of the aortic valve. There is mild aortic valve annular calcification. Aortic valve regurgitation is not visualized. No aortic stenosis  is present. Aortic valve mean gradient measures 3.4 mmHg. Aortic valve peak gradient measures 6.4 mmHg. Aortic valve area, by VTI measures 2.54 cm. Pulmonic Valve: The pulmonic valve was abnormal. Pulmonic valve regurgitation is mild. No evidence of pulmonic stenosis. Aorta: The aortic root and ascending aorta are structurally normal, with no evidence of dilitation. IAS/Shunts: The interatrial septum was not well visualized.  LEFT VENTRICLE PLAX 2D LVIDd:         5.50 cm   Diastology LVIDs:         4.80 cm   LV e' medial:    5.78 cm/s LV PW:         0.80 cm   LV E/e' medial:  18.5 LV IVS:        1.00 cm   LV e' lateral:   8.93 cm/s LVOT diam:     2.20 cm   LV E/e' lateral: 12.0 LV SV:         56 LV SV Index:   24 LVOT Area:     3.80 cm  RIGHT VENTRICLE RV S prime:     10.20 cm/s TAPSE (M-mode): 1.1 cm LEFT ATRIUM             Index        RIGHT ATRIUM           Index LA diam:        4.90 cm 2.12 cm/m   RA Area:     15.00 cm LA Vol (A2C):   73.6 ml 31.87 ml/m  RA Volume:   34.60 ml  14.98 ml/m LA Vol (A4C):   42.2 ml 18.27 ml/m LA Biplane Vol: 56.9 ml 24.64 ml/m  AORTIC VALVE AV Area (Vmax):    2.77 cm AV Area (Vmean):   2.26 cm AV Area (VTI):     2.54 cm AV Vmax:           126.27 cm/s AV Vmean:          86.342 cm/s AV VTI:            0.222 m AV Peak Grad:  6.4 mmHg AV Mean Grad:      3.4 mmHg LVOT Vmax:         91.95 cm/s LVOT Vmean:        51.233 cm/s LVOT VTI:          0.148 m LVOT/AV VTI ratio: 0.67  AORTA Ao Root diam: 3.20 cm Ao Asc diam:  2.90 cm MITRAL VALVE MV Area (PHT): 4.89 cm     SHUNTS MV Decel Time: 155 msec      Systemic VTI:  0.15 m MR Peak grad: 34.5 mmHg     Systemic Diam: 2.20 cm MR Vmax:      293.50 cm/s MV E velocity: 107.00 cm/s MV A velocity: 46.50 cm/s MV E/A ratio:  2.30 Dina Rich MD Electronically signed by Dina Rich MD Signature Date/Time: 10/21/2022/3:48:50 PM    Final    DG Chest Portable 1 View  Result Date: 10/21/2022 CLINICAL DATA:  Difficulty breathing.  Feet and leg swelling. EXAM: PORTABLE CHEST 1 VIEW COMPARISON:  11/12/2021. FINDINGS: Mild diffuse increased interstitial markings. Redemonstration of small right pleural effusion with associated partial obscuration of right medial hemidiaphragm and right heart border, favoring combination of right lung atelectasis and/or consolidation. Bilateral lungs are otherwise clear. Left lateral costophrenic angle is clear. Stable mildly enlarged cardio-mediastinal silhouette. No acute osseous abnormalities. The soft tissues are within normal limits. IMPRESSION: 1. Mild congestive heart failure/pulmonary edema. 2. Stable small right pleural effusion with probable associated right lung atelectasis and/or consolidation. Correlate clinically. 3. Stable mild cardiomegaly. Electronically Signed   By: Jules Schick M.D.   On: 10/21/2022 11:07    Catarina Hartshorn, DO  [facility]  If 7PM-7AM, please contact night-coverage [email or web address] Password [email or web address] 11/12/2022, 5:28 PM   LOS: 9 days

## 2022-11-12 NOTE — Plan of Care (Signed)
  Problem: Education: Goal: Knowledge of disease or condition will improve Outcome: Progressing Goal: Knowledge of secondary prevention will improve (MUST DOCUMENT ALL) Outcome: Progressing Goal: Knowledge of patient specific risk factors will improve [name] N/A or DELETE if not current risk factor) Outcome: Progressing   Problem: Ischemic Stroke/TIA Tissue Perfusion: Goal: Complications of ischemic stroke/TIA will be minimized Outcome: Progressing   Problem: Coping: Goal: Will verbalize positive feelings about self Outcome: Progressing Goal: Will identify appropriate support needs Outcome: Progressing   Problem: Health Behavior/Discharge Planning: Goal: Ability to manage health-related needs will improve Outcome: Progressing Goal: Goals will be collaboratively established with patient/family Outcome: Progressing   Problem: Self-Care: Goal: Ability to participate in self-care as condition permits will improve Outcome: Progressing Goal: Verbalization of feelings and concerns over difficulty with self-care will improve Outcome: Progressing Goal: Ability to communicate needs accurately will improve Outcome: Progressing   Problem: Nutrition: Goal: Risk of aspiration will decrease Outcome: Progressing Goal: Dietary intake will improve Outcome: Progressing   Problem: Education: Goal: Knowledge of General Education information will improve Description: Including pain rating scale, medication(s)/side effects and non-pharmacologic comfort measures Outcome: Progressing   Problem: Health Behavior/Discharge Planning: Goal: Ability to manage health-related needs will improve Outcome: Progressing   Problem: Clinical Measurements: Goal: Ability to maintain clinical measurements within normal limits will improve Outcome: Progressing Goal: Will remain free from infection Outcome: Progressing Goal: Diagnostic test results will improve Outcome: Progressing Goal: Respiratory  complications will improve Outcome: Progressing Goal: Cardiovascular complication will be avoided Outcome: Progressing   Problem: Activity: Goal: Risk for activity intolerance will decrease Outcome: Progressing   Problem: Nutrition: Goal: Adequate nutrition will be maintained Outcome: Progressing   Problem: Coping: Goal: Level of anxiety will decrease Outcome: Progressing   Problem: Elimination: Goal: Will not experience complications related to bowel motility Outcome: Progressing Goal: Will not experience complications related to urinary retention Outcome: Progressing   Problem: Pain Management: Goal: General experience of comfort will improve Outcome: Progressing   Problem: Safety: Goal: Ability to remain free from injury will improve Outcome: Progressing   Problem: Skin Integrity: Goal: Risk for impaired skin integrity will decrease Outcome: Progressing   Problem: Education: Goal: Ability to describe self-care measures that may prevent or decrease complications (Diabetes Survival Skills Education) will improve Outcome: Progressing Goal: Individualized Educational Video(s) Outcome: Progressing   Problem: Coping: Goal: Ability to adjust to condition or change in health will improve Outcome: Progressing   Problem: Fluid Volume: Goal: Ability to maintain a balanced intake and output will improve Outcome: Progressing   Problem: Health Behavior/Discharge Planning: Goal: Ability to identify and utilize available resources and services will improve Outcome: Progressing Goal: Ability to manage health-related needs will improve Outcome: Progressing   Problem: Metabolic: Goal: Ability to maintain appropriate glucose levels will improve Outcome: Progressing   Problem: Nutritional: Goal: Maintenance of adequate nutrition will improve Outcome: Progressing Goal: Progress toward achieving an optimal weight will improve Outcome: Progressing   Problem: Skin  Integrity: Goal: Risk for impaired skin integrity will decrease Outcome: Progressing   Problem: Tissue Perfusion: Goal: Adequacy of tissue perfusion will improve Outcome: Progressing

## 2022-11-13 DIAGNOSIS — J9601 Acute respiratory failure with hypoxia: Secondary | ICD-10-CM | POA: Diagnosis not present

## 2022-11-13 DIAGNOSIS — I5023 Acute on chronic systolic (congestive) heart failure: Secondary | ICD-10-CM | POA: Diagnosis not present

## 2022-11-13 DIAGNOSIS — E1165 Type 2 diabetes mellitus with hyperglycemia: Secondary | ICD-10-CM

## 2022-11-13 DIAGNOSIS — F17200 Nicotine dependence, unspecified, uncomplicated: Secondary | ICD-10-CM | POA: Diagnosis not present

## 2022-11-13 LAB — TYPE AND SCREEN
ABO/RH(D): A POS
Antibody Screen: NEGATIVE
Unit division: 0

## 2022-11-13 LAB — BASIC METABOLIC PANEL
Anion gap: 8 (ref 5–15)
BUN: 48 mg/dL — ABNORMAL HIGH (ref 8–23)
CO2: 24 mmol/L (ref 22–32)
Calcium: 7.8 mg/dL — ABNORMAL LOW (ref 8.9–10.3)
Chloride: 99 mmol/L (ref 98–111)
Creatinine, Ser: 1.83 mg/dL — ABNORMAL HIGH (ref 0.61–1.24)
GFR, Estimated: 40 mL/min — ABNORMAL LOW (ref >60)
Glucose, Bld: 259 mg/dL — ABNORMAL HIGH (ref 70–99)
Potassium: 4.5 mmol/L (ref 3.5–5.1)
Sodium: 131 mmol/L — ABNORMAL LOW (ref 135–145)

## 2022-11-13 LAB — GLUCOSE, CAPILLARY
Glucose-Capillary: 208 mg/dL — ABNORMAL HIGH (ref 70–99)
Glucose-Capillary: 261 mg/dL — ABNORMAL HIGH (ref 70–99)
Glucose-Capillary: 134 mg/dL — ABNORMAL HIGH (ref 70–99)
Glucose-Capillary: 150 mg/dL — ABNORMAL HIGH (ref 70–99)
Glucose-Capillary: 238 mg/dL — ABNORMAL HIGH (ref 70–99)

## 2022-11-13 LAB — CBC
HCT: 28.5 % — ABNORMAL LOW (ref 39.0–52.0)
Hemoglobin: 8.4 g/dL — ABNORMAL LOW (ref 13.0–17.0)
MCH: 23 pg — ABNORMAL LOW (ref 26.0–34.0)
MCHC: 29.5 g/dL — ABNORMAL LOW (ref 30.0–36.0)
MCV: 78.1 fL — ABNORMAL LOW (ref 80.0–100.0)
Platelets: 267 10*3/uL (ref 150–400)
RBC: 3.65 MIL/uL — ABNORMAL LOW (ref 4.22–5.81)
RDW: 21.2 % — ABNORMAL HIGH (ref 11.5–15.5)
WBC: 14.1 10*3/uL — ABNORMAL HIGH (ref 4.0–10.5)
nRBC: 0.3 % — ABNORMAL HIGH (ref 0.0–0.2)

## 2022-11-13 LAB — BPAM RBC
Blood Product Expiration Date: 202411232359
ISSUE DATE / TIME: 202411021053
Unit Type and Rh: 6200

## 2022-11-13 MED ORDER — FERROUS SULFATE 325 (65 FE) MG PO TABS: 325.0000 mg | ORAL_TABLET | Freq: Every day | ORAL | Status: DC

## 2022-11-13 MED ORDER — ASPIRIN 81 MG PO TBEC: 81.0000 mg | DELAYED_RELEASE_TABLET | Freq: Every day | ORAL | Status: DC

## 2022-11-13 MED ORDER — FOLIC ACID 1 MG PO TABS: 1.0000 mg | ORAL_TABLET | Freq: Every day | ORAL | Status: DC

## 2022-11-13 MED ORDER — TORSEMIDE 20 MG PO TABS
20.0000 mg | ORAL_TABLET | Freq: Every day | ORAL | Status: DC
  Administered 2022-11-13 – 2022-11-15 (×3): 20 mg via ORAL
  Filled 2022-11-13 (×3): qty 1

## 2022-11-13 MED ORDER — INSULIN GLARGINE-YFGN 100 UNIT/ML ~~LOC~~ SOLN
5.0000 [IU] | Freq: Every day | SUBCUTANEOUS | Status: DC
  Administered 2022-11-13 – 2022-11-15 (×3): 5 [IU] via SUBCUTANEOUS
  Filled 2022-11-13 (×5): qty 0.05

## 2022-11-13 MED ORDER — TORSEMIDE 20 MG PO TABS: 20.0000 mg | ORAL_TABLET | Freq: Every day | ORAL | 1 refills | Status: DC

## 2022-11-13 MED ORDER — DEXAMETHASONE 4 MG PO TABS
4.0000 mg | ORAL_TABLET | Freq: Every day | ORAL | Status: DC
  Administered 2022-11-13 – 2022-11-15 (×3): 4 mg via ORAL
  Filled 2022-11-13 (×3): qty 1

## 2022-11-13 MED ORDER — LINACLOTIDE 145 MCG PO CAPS: 145.0000 ug | ORAL_CAPSULE | Freq: Every day | ORAL | 1 refills | Status: DC

## 2022-11-13 NOTE — Progress Notes (Signed)
PROGRESS NOTE  [name]:[id number] DOB: 1955/09/09 DOA: 11/02/2022 PCP: Benetta Spar, MD  Brief History:   67 y.o. male with a history of CAD, tobacco use, HTN, HLD, T2DM, GERD, history of cocaine use, OSA, and combined CHF (LVEF 20-25%, G2DD) who presented to the ED 10/23 with migratory abdominal/back/pelvic pain and dyspnea. He was admitted for recurrent acute on chronic combined CHF, [place] +cocaine. IV diuresis is underway with cardiology consultation. Developed delirium 10/25, head CT nonacute. Mentation has cleared up a bit but not at baseline. CBG shows no new hypercarbia. MR brain shows chronic meningioma with edema without midline shift.  Hospitalization has been prolonged secondary to fluid overload requiring aggressive diuresis as well as drop in hemoglobin.  Patient was transfused 2 unit PRBC total for the hospitalization .  GI was consulted assist with management.  colonoscopy--3 polyps in transverse colon, one in ascending colon;  iron deficiency due to large polyps with one with visible oozing. Slow response to diuresis in part due to patient's dietary indiscretion as he ordered outside food.   Assessment/Plan:  Acute hypoxic respiratory failure due to acute on chronic HFrEF, pleural effusions:  - initially on 2L - oxygenation improved with oxygenation - Continue to stress incentive spirometry, ambulate the patient as atelectasis is contributing. -now stable on RA, but uses oxygen for comfort -11/02/22 CTA chest--no dissection; +pleural eff, +pulmonary edema -11/02/22 CTA abd--chronic occlusion of left common iliac with reconstitution of left fem artery;  no acute intraabdominal process   Acute metabolic encephalopathy, delirium:  -Possibly element of hospital delirium and/or cocaine withdrawal.  - CT head >> no acute findings, though chronic microvascular disease and meningioma decrease threshold for confusion.  - LFT's/ammonia wnl.  - 10/28 VBG  1.28/53/<31/29 - No urinary retention noted per RN - 10/27 MR brain--slight interval growth of meningioma with progressive edema of R-temporal lobe - Keep delirium precautions and minimize sedating medications if able.  -10/31 UA/culture--no pyuria -B12--576, folate 7.4 -TSH 1.349 -Ammonia - <10 -Given thiamine 500 mg, 6 doses; continue thiamine 100 mg daily -Mental status partly functional  Speech disturbance -MRI brain negative for stroke--showed a small meningioma and chronic cerebral edema -No focal deficits -Occurs intermittently during the hospitalization -Appears functional   CKD 3a -baseline creatinine 1.4-1.7 -monitor with diuresis   Acute on chronic combined HFrEF and RV failure:  - Unclear whether he was taking cardiac medications PTA, though he reports he was. Given severity of biventricular failure, cardiology consulted for advice. We will optimize volume status then discharge.  - Monitor I/O (actually turned a positive balance when PM lasix dose held), ReDS vest >40% on 10/28  - remains clinically fluid overloaded>>continued IV lasix - Given cocaine positivity, stopped beta blocker initially -10/21/22 Echo--EF 20-25%, G2DD, mild MR -transitioned to po torsemide 10/31     Meningioma: Extra-axial brain mass known since 2023. Had mediastinal lymphadenopathy and pleural effusion sampled during that admission which did not show malignancy. Plan per neurosurgery was to biopsy brain mass if those were negative.  - Given persistent encephalopathy and cerebral edema suggested by CT, we checked MRI w/ and w/o contrast which shows increased size in meningioma with chronic cerebral edema. Dr. Jarvis Newcomer spoke with Dr. Maurice Small who initially saw this patient. He reviewed updated images and history. Recommendation is for outpatient follow up, though with small size and appearance so characteristic of meningioma, no intervention is anticipated. Given the confusion and cerebral edema, he  was  ok with short trial of decadron which we have pursued.    Cocaine abuse: +UDS - Cessation counseling   Acute on Chronic microcytic anemia:  -Hgb 10>>7.4 though no gross bleeding is noted. BUN up, though likely due to steroids and pt is on PPI.   - iron saturation 8%, ferritin 13 -foalte 7.4, B12 576 - Increase PPI to BID, check FOBT. Only record of GI evaluation was colonoscopy 2012 showing 2 sigmoid polyps, external hemorrhoids.  - Ok to continue lovenox VTE ppx for now unless bleeding is noted. -GI consult appreciated -10/30--transfuse one unit PRBC -11/2--transfuse one unit PRBC -11/1 EGD--ertythematous mucosa in antrum, +duodenitis -11/1 colonoscopy--3 polyps in transverse colon, one in ascending colon;  iron deficiency due to large polyps with one with visible oozing -11/1--give venofer x 1 -start ferrous sulfate in 11/2 -No signs of active bleeding   Scrotal pain, history of Fournier's gangrene: U/S reassuring (stable epididymal cyst), ?if hydrocele from overload contributing. Also note Dx from PCP "scrotal pain, chronic, bilateral" a year ago.  - Will discontinue SGLT2i permanently. - Supportive care, urology follow up.   OSA:  - CPAP at bedtime   Peripheral neuropathy:  - Continue gabapentin   CAD, HTN: No chest pain. Later did have atypical chest pain likely musculoskeletal with persistently negative troponin and no ischemic ECG changes.  - Continue ASA, statin, BB   Cellulitis: Localized induration in areas where he may have injured the skin. No fluctuance/indication for I&D at this time.  - Treating with keflex.  - Also has likely dermal inclusion cyst on left posterior neck that does not appear infected.    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  - Needs vascular surgery evaluation as outpatient.      T2DM:  - SSI.  -10/21/22 HbA1c was 6.3%, seeing some steroid-induced hyperglycemia  -change to resistant SSI  -add low dose  semglee and premeal insulin   Tobacco use:  - Nicotine patch - Cessation counseling   Alcohol dependence -start high dose thiamine -may have a degree of Wernicke-Korsakoff syndrome      Family Communication:   daughter updated 11/3   Consultants:  cardiology, GI   Code Status:  FULL    DVT Prophylaxis:   [place] Lovenox     Procedures: As Listed in Progress Note Above   Antibiotics: cephalexin    Subjective: Patient states that his breathing overall has improved.  He does not cough or any outside.  Overnight.  He is tolerating his diet.  Denies any vomiting, diarrhea, abdominal pain, chest pain, fevers, chills.  Objective: Vitals:   11/12/22 1040 11/12/22 1113 11/12/22 1400 11/13/22 0530  BP: 92/63 (!) 100/56 115/79 101/74  Pulse: 72 81 78 72  Resp: 16 15 17 16   Temp: (!) 97.5 F (36.4 C) 97.8 F (36.6 C) 97.8 F (36.6 C) (!) 97.5 F (36.4 C)  TempSrc: Axillary Axillary Oral Oral  SpO2: 100% 100% 100% 100%  Weight:    108.2 kg  Height:        Intake/Output Summary (Last 24 hours) at 11/13/2022 1220 Last data filed at 11/13/2022 0900 Gross per 24 hour  Intake 2084 ml  Output --  Net 2084 ml   Weight change: 2.8 kg Exam:  General:  Pt is alert, follows commands appropriately, not in acute distress HEENT: No icterus, No thrush, No neck mass, [place]/AT Cardiovascular: RRR, S1/S2, no rubs, no gallops Respiratory: Bibasilar crackles but no wheezing.  Abdomen  Abdomen: Soft/+BS, non tender, non distended, no guarding Extremities: 1 + LE edema, No lymphangitis, No petechiae, No rashes, no synovitis   Data Reviewed: I have personally reviewed following labs and imaging studies Basic Metabolic Panel: Recent Labs  Lab 11/09/22 0503 11/10/22 0448 11/11/22 0414 11/12/22 0448 11/13/22 0408  NA 133* 132* 133* 130* 131*  K 4.4 4.3 4.4 4.3 4.5  CL 98 95* 98 98 99  CO2 27 26 28 23 24   GLUCOSE 201* 266* 119* 361* 259*  BUN 43* 52* 49* 55* 48*  CREATININE 1.61* 1.69*  1.60* 2.00* 1.83*  CALCIUM 8.5* 8.6* 8.4* 8.1* 7.8*  MG  --  2.3  --   --   --    Liver Function Tests: Recent Labs  Lab 11/11/22 0414  AST 67*  ALT 52*  ALKPHOS 102  BILITOT 0.8  PROT 7.3  ALBUMIN 2.7*   No results for input(s): "LIPASE", "AMYLASE" in the last 168 hours. Recent Labs  Lab 11/10/22 2128  AMMONIA <10   Coagulation Profile: No results for input(s): "INR", "PROTIME" in the last 168 hours. CBC: Recent Labs  Lab 11/09/22 0503 11/10/22 0448 11/11/22 0414 11/12/22 0448 11/12/22 1634 11/13/22 0408  WBC 12.8* 16.0* 12.5* 13.9*  --  14.1*  NEUTROABS  --   --  10.8*  --   --   --   HGB 7.4* 9.0* 8.1* 7.7* 9.2* 8.4*  HCT 25.4* 30.4* 27.9* 25.7* 30.5* 28.5*  MCV 76.7* 77.6* 77.5* 77.4*  --  78.1*  PLT 277 304 261 249  --  267   Cardiac Enzymes: No results for input(s): "CKTOTAL", "CKMB", "CKMBINDEX", "TROPONINI" in the last 168 hours. BNP: Invalid input(s): "POCBNP" CBG: Recent Labs  Lab 11/12/22 1121 11/12/22 1641 11/12/22 2124 11/13/22 0744 11/13/22 1116  GLUCAP 419* 197* 138* 208* 261*   HbA1C: No results for input(s): "HGBA1C" in the last 72 hours. Urine analysis:    Component Value Date/Time   COLORURINE STRAW (A) 11/10/2022 2008   APPEARANCEUR CLEAR 11/10/2022 2008   APPEARANCEUR Clear 08/02/2021 1051   LABSPEC 1.006 11/10/2022 2008   PHURINE 5.0 11/10/2022 2008   GLUCOSEU NEGATIVE 11/10/2022 2008   HGBUR NEGATIVE 11/10/2022 2008   BILIRUBINUR NEGATIVE 11/10/2022 2008   BILIRUBINUR Negative 08/02/2021 1051   KETONESUR NEGATIVE 11/10/2022 2008   PROTEINUR NEGATIVE 11/10/2022 2008   UROBILINOGEN 0.2 09/27/2014 2237   NITRITE NEGATIVE 11/10/2022 2008   LEUKOCYTESUR NEGATIVE 11/10/2022 2008   Sepsis Labs: @LABRCNTIP (procalcitonin:4,lacticidven:4) )No results found for this or any previous visit (from the past 240 hour(s)).   Scheduled Meds:  aspirin EC  81 mg Oral Daily   atorvastatin  40 mg Oral QHS   dexamethasone  4 mg Oral Daily    enoxaparin (LOVENOX) injection  40 mg Subcutaneous Q24H   ferrous sulfate  325 mg Oral Q breakfast   folic acid  1 mg Oral Daily   gabapentin  300 mg Oral TID   insulin aspart  0-20 Units Subcutaneous TID WC   insulin aspart  0-5 Units Subcutaneous QHS   insulin aspart  5 Units Subcutaneous TID WC   insulin glargine-yfgn  5 Units Subcutaneous Daily   lidocaine  1 patch Transdermal Q24H   linaclotide  145 mcg Oral QAC breakfast   nicotine  21 mg Transdermal Daily   pantoprazole  40 mg Oral BID   polyethylene glycol-electrolytes  4,000 mL Oral Once   sodium chloride flush  3 mL Intravenous Q12H   thiamine  100 mg  Oral Daily   torsemide  20 mg Oral Daily   Continuous Infusions:  Procedures/Studies: US Abdomen Limited RUQ (LIVER/GB)  Result Date: 11/11/2022 CLINICAL DATA:  Abdominal pain.  Transaminitis EXAM: ULTRASOUND ABDOMEN LIMITED RIGHT UPPER QUADRANT COMPARISON:  CT 11/01/2022. FINDINGS: Gallbladder: Mildly distended gallbladder. Dependent stones. No adjacent fluid. Slightly thickened wall of up to 4 mm. No reported sonographic Murphy's sign. Common bile duct: Diameter: 3 mm Liver: No focal lesion identified. Within normal limits in parenchymal echogenicity. Portal vein is patent on color Doppler imaging with normal direction of blood flow towards the liver. Other: None. IMPRESSION: Gallbladder mildly distended with stones. Slight wall thickening. No ductal dilatation. Please correlate for other clinical presentation of acute cholecystitis and if needed confirmatory HIDA scan as clinically appropriate Electronically Signed   By: Karen Kays M.D.   On: 11/11/2022 13:56   DG CHEST PORT 1 VIEW  Result Date: 11/07/2022 CLINICAL DATA:  Acute respiratory failure, hypoxia EXAM: PORTABLE CHEST 1 VIEW COMPARISON:  11/02/2022 FINDINGS: Single frontal view of the chest demonstrates an enlarged cardiac silhouette. Mild pulmonary vascular congestion, with minimal bibasilar airspace disease left  greater than right. Trace left effusions are suspected. No pneumothorax. No acute bony abnormalities. IMPRESSION: 1. Findings consistent with mild congestive heart failure. Electronically Signed   By: Sharlet Salina M.D.   On: 11/07/2022 15:42   MR BRAIN W WO CONTRAST  Result Date: 11/06/2022 CLINICAL DATA:  Meningioma follow-up. EXAM: MRI HEAD WITHOUT AND WITH CONTRAST TECHNIQUE: Multiplanar, multiecho pulse sequences of the brain and surrounding structures were obtained without and with intravenous contrast. CONTRAST:  10mL GADAVIST GADOBUTROL 1 MMOL/ML IV SOLN COMPARISON:  CT head without contrast 11/05/2022. Head without and with contrast 11/08/2021. FINDINGS: Brain: A meningioma along the medial aspect of the right middle cranial fossa demonstrates slight interval growth now measuring 3.0 x 1.9 x 2.1 cm. The meningioma does not extend into the right cavernous sinus and is separate from the right orbital apex. Progressive edema is present throughout the right temporal lobe. A remote infarct is present in the right occipital lobe. Moderate generalized atrophy and white matter disease is present bilaterally. The ventricles are proportionate to the degree of atrophy. No significant extraaxial fluid collection is present. Postcontrast images demonstrate no other pathologic enhancement. White matter changes extend into the brainstem. Remote lacunar infarcts are present in the cerebellum bilaterally. The internal auditory canals are within normal limits. Midline structures are within normal limits. Vascular: Flow is present in the major intracranial arteries. Skull and upper cervical spine: Degenerative changes are present in the upper cervical spine. The craniocervical junction is normal. Marrow signal is normal. Sinuses/Orbits: Bilateral mastoid effusions are present. No obstructing nasopharyngeal lesion is present. The paranasal sinuses and mastoid air cells are otherwise clear. IMPRESSION: 1. Slight interval  growth of meningioma along the medial aspect of the right middle cranial fossa now measuring 3.0 x 1.9 x 2.1 cm. 2. Progressive edema throughout the right temporal lobe. 3. Remote infarct of the right occipital lobe. 4. Remote lacunar infarcts of the cerebellum bilaterally. 5. Moderate generalized atrophy and white matter disease likely reflects the sequela of chronic microvascular ischemia. 6. Bilateral mastoid effusions. No obstructing nasopharyngeal lesion is present. Electronically Signed   By: Marin Roberts M.D.   On: 11/06/2022 19:44   CT HEAD WO CONTRAST ( )  Result Date: 11/05/2022 CLINICAL DATA:  67 year old male altered mental status. EXAM: CT HEAD WITHOUT CONTRAST TECHNIQUE: Contiguous axial images were obtained from the base of the  skull through the vertex without intravenous contrast. RADIATION DOSE REDUCTION: This exam was performed according to the departmental dose-optimization program which includes automated exposure control, adjustment of the mA and/or kV according to patient size and/or use of iterative reconstruction technique. COMPARISON:  Brain MRI 11/08/2021.  Head CT 11/07/2021. FINDINGS: Brain: Chronic abnormal hypodensity in the right temporal lobe tracking into the deep white matter capsules, most resembles vasogenic edema. Chronic enhancing mass at the mesial right temporal lobe, lateral to the cavernous sinus on MRI last year, occult by plain CT. Stable underlying cerebral volume and superimposed chronic right PCA territory encephalomalacia. Stable chronic small vessel disease, patchy additional white matter and thalamic involvement. No midline shift or new intracranial mass effect. No acute cortically based infarct. No acute intracranial hemorrhage identified. Vascular: Calcified atherosclerosis at the skull base. No suspicious intracranial vascular hyperdensity. Skull: No acute osseous abnormality identified. Sinuses/Orbits: Visualized paranasal sinuses and mastoids are  stable and well aerated. Other: Visualized orbits and scalp soft tissues are within normal limits. IMPRESSION: 1. Chronically abnormal right middle cranial fossa where vasogenic edema in the right temporal lobe appears related to enhancing mass lateral to the right cavernous sinus on MRI last year (occult by CT). Stability and time course favor a Meningioma with chronic cerebral edema. No midline shift or progression by noncontrast CT. 2. No acute intracranial abnormality. Underlying chronic right PCA territory infarct. Electronically Signed   By: Odessa Fleming M.D.   On: 11/05/2022 12:36   DG Abd 1 View  Result Date: 11/03/2022 CLINICAL DATA:  Abdominal pain.  Constipation. EXAM: ABDOMEN - 1 VIEW COMPARISON:  CTA yesterday.  Abdominopelvic CT 2 days ago FINDINGS: Bilateral pleural effusions. Cardiomegaly. Moderate volume of colonic stool. No bowel obstruction. There are vascular calcifications. Excreted IV contrast in the urinary bladder. IMPRESSION: 1. Moderate colonic stool burden. No bowel obstruction. 2. Bilateral pleural effusions. Cardiomegaly. Electronically Signed   By: Narda Rutherford M.D.   On: 11/03/2022 23:40   CT Angio Chest/Abd/Pel for Dissection W and/or Wo Contrast  Result Date: 11/02/2022 CLINICAL DATA:  Chest pain.  Acute aortic syndrome EXAM: CT ANGIOGRAPHY CHEST, ABDOMEN AND PELVIS TECHNIQUE: Non-contrast CT of the chest was initially obtained. Multidetector CT imaging through the chest, abdomen and pelvis was performed using the standard protocol during bolus administration of intravenous contrast. Multiplanar reconstructed images and MIPs were obtained and reviewed to evaluate the vascular anatomy. RADIATION DOSE REDUCTION: This exam was performed according to the departmental dose-optimization program which includes automated exposure control, adjustment of the mA and/or kV according to patient size and/or use of iterative reconstruction technique. CONTRAST:  OMNIPAQUE IOHEXOL 350  MG/ML SOLN COMPARISON:  CT chest abdomen pelvis 11/07/2021 CT abdomen 11/01/2022 FINDINGS: CTA CHEST FINDINGS Cardiovascular: Noncontrast series demonstrates no intramural hematoma within the thoracic aorta. Contrast series demonstrates no aortic dissection or aneurysm. Great vessels normal. No pericardial fluid. Mediastinum/Nodes: Enlarged mediastinal lymph nodes are not changed from CT 1 year prior. For example LEFT lower paratracheal lymph node measuring 17 mm compared to 16 mm. RIGHT lower paratracheal node measuring 16 mm compares to 20 mm. Lungs/Pleura: Bilateral pleural effusions. Moderate effusion on the RIGHT and small effusion on the LEFT. Mild diffuse ground-glass density in the lungs suggest mild pulmonary edema. Mild interstitial thickening chest mild interstitial edema. No pneumothorax. No pneumonia. Musculoskeletal: No aggressive osseous lesion. Review of the MIP images confirms the above findings. CTA ABDOMEN AND PELVIS FINDINGS VASCULAR Aorta: Normal caliber aorta without aneurysm, dissection, vasculitis or significant stenosis.  Celiac: Patent without evidence of aneurysm, dissection, vasculitis or significant stenosis. SMA: Patent without evidence of aneurysm, dissection, vasculitis or significant stenosis. Renals: Both renal arteries are patent without evidence of aneurysm, dissection, vasculitis, fibromuscular dysplasia or significant stenosis. IMA: Patent without evidence of aneurysm, dissection, vasculitis or significant stenosis. 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 Reconstitution of the LEFT femoral artery. No interval change. Veins: No obvious venous abnormality within the limitations of this arterial phase study. Review of the MIP images confirms the above findings. NON-VASCULAR Lower chest: Lung bases are clear. Hepatobiliary: No focal hepatic lesion. Normal gallbladder. No biliary duct dilatation. Common bile duct is normal. Pancreas: Pancreas is normal. No ductal  dilatation. No pancreatic inflammation. Spleen: Normal spleen Adrenals/urinary tract: Adrenal glands and kidneys are normal. The ureters and bladder normal. Stomach/Bowel: Stomach, small bowel, appendix, and cecum are normal. The colon and rectosigmoid colon are normal. Vascular/Lymphatic: Abdominal aorta is normal caliber. No periportal or retroperitoneal adenopathy. No pelvic adenopathy. Reproductive: Prostate unremarkable Other: No free fluid. Musculoskeletal: No aggressive osseous lesion. Review of the MIP images confirms the above findings. IMPRESSION: CHEST: 1. No evidence of aortic dissection or aneurysm. 2. Bilateral pleural effusions and mild pulmonary edema. 3. Stable mediastinal adenopathy. PELVIS: 1. No evidence of aortic dissection or aneurysm. 2. Chronic occlusion of the LEFT common iliac artery with reconstitution of the LEFT femoral artery. 3. No acute findings in the abdomen pelvis. Electronically Signed   By: Genevive Bi M.D.   On: 11/02/2022 11:25   DG Chest Portable 1 View  Result Date: 11/02/2022 CLINICAL DATA:  Shortness of breath EXAM: PORTABLE CHEST 1 VIEW COMPARISON:  Chest radiograph dated 10/21/2022 FINDINGS: Normal lung volumes. Mild bilateral interstitial opacities. No pleural effusion or pneumothorax. Similar enlarged cardiomediastinal silhouette. No acute osseous abnormality. IMPRESSION: 1. Mild bilateral interstitial opacities, which may represent pulmonary edema. 2. Similar cardiomegaly. Electronically Signed   By: Agustin Cree M.D.   On: 11/02/2022 09:43   CT ABDOMEN PELVIS W CONTRAST  Result Date: 11/01/2022 CLINICAL DATA:  Abdominal pain and flank pain.  Stone suspected EXAM: CT ABDOMEN AND PELVIS WITH CONTRAST TECHNIQUE: Multidetector CT imaging of the abdomen and pelvis was performed using the standard protocol following bolus administration of intravenous contrast. RADIATION DOSE REDUCTION: This exam was performed according to the departmental dose-optimization  program which includes automated exposure control, adjustment of the mA and/or kV according to patient size and/or use of iterative reconstruction technique. CONTRAST:  OMNIPAQUE IOHEXOL 300 MG/ML  SOLN COMPARISON:  None Available. FINDINGS: Lower chest: Moderate pleural effusion Hepatobiliary: No focal hepatic lesion. Two tiny gallstones measuring less than 5 mm each. No gallbladder distension. No biliary duct dilatation. Common bile duct is normal. Pancreas: Pancreas is normal. No ductal dilatation. No pancreatic inflammation. Spleen: Normal spleen Adrenals/urinary tract: Adrenal glands normal. No nephrolithiasis or ureterolithiasis. Perinephric stranding similar comparison CT. No renal obstruction on delayed imaging. Bladder normal. Stomach/Bowel: Stomach, small-bowel and cecum are normal. The appendix is not identified but there is no pericecal inflammation to suggest appendicitis. Insert Vascular/Lymphatic: Abdominal aorta is normal caliber with atherosclerotic calcification. There is no retroperitoneal or periportal lymphadenopathy. No pelvic lymphadenopathy. Reproductive: Prostate normal Other: small volume of free fluid along the LEFT and RIGHT pericolic gutter. Musculoskeletal: No aggressive osseous lesion. IMPRESSION: 1. No nephrolithiasis or ureterolithiasis. 2. Perinephric stranding similar to comparison CT. No renal obstruction on delayed imaging. 3. Moderate pleural effusion. 4. Small volume of free fluid along the LEFT and RIGHT pericolic gutter.  5. Cholelithiasis without evidence of cholecystitis. 6.  Aortic Atherosclerosis (ICD10-I70.0). Electronically Signed   By: Genevive Bi M.D.   On: 11/01/2022 16:45   US SCROTUM W/DOPPLER  Result Date: 11/01/2022 CLINICAL DATA:  Testicular pain EXAM: SCROTAL ULTRASOUND DOPPLER ULTRASOUND OF THE TESTICLES TECHNIQUE: Complete ultrasound examination of the testicles, epididymis, and other scrotal structures was performed. Color and spectral Doppler  ultrasound were also utilized to evaluate blood flow to the testicles. COMPARISON:  None Available. FINDINGS: Right testicle Measurements: 3.3 x 1.6 x 2.8 cm. No mass or microlithiasis visualized. Left testicle Measurements: 3.4 x 2.2 x 2.2 cm. No mass or microlithiasis visualized. Right epididymis:  Normal in size and appearance. Left epididymis: Epididymal head cyst with internal echogenic debris measuring 1.1 x 1.0 x 1.0 cm. Hydrocele:  Small left hydrocele. Varicocele:  None visualized. Pulsed Doppler interrogation of both testes demonstrates normal low resistance arterial and venous waveforms bilaterally. IMPRESSION: 1. Complex left epididymal head cyst measuring up to 1.1 cm. 2. Small left hydrocele. Electronically Signed   By: Allegra Lai M.D.   On: 11/01/2022 11:46   ECHOCARDIOGRAM COMPLETE  Result Date: 10/21/2022    ECHOCARDIOGRAM REPORT   Patient Name:   MARSEL GAIL Date of Exam: 10/21/2022 Medical Rec #:  [id number]        Height:       69.0 in Accession #:    [id number]       Weight:       260.0 lb Date of Birth:  August 02, 1955        BSA:          2.310 m Patient Age:    67 years         BP:           104/78 mmHg Patient Gender: M                HR:           74 bpm. Exam [facility] Procedure: 2D Echo, Cardiac Doppler and Color Doppler Indications:    CHF- Acute Systolic I50.21  History:        Patient has prior history of Echocardiogram examinations, most                 recent 11/07/2021. CHF and Cardiomyopathy, Angina and Previous                 Myocardial Infarction, Stroke and PAD, Arrythmias:Tachycardia,                 Signs/Symptoms:Chest Pain; Risk Factors:Hypertension, Sleep                 Apnea, Diabetes and Dyslipidemia. CKD, stage 3.  Sonographer:    Lucendia Herrlich RCS Referring Phys: [phone] CARLOS MADERA  Sonographer Comments: Image acquisition challenging due to uncooperative patient. IMPRESSIONS  1. Left ventricular ejection fraction, by estimation, is 20 to 25%.  The left ventricle has severely decreased function. The left ventricle demonstrates global hypokinesis. Left ventricular diastolic parameters are consistent with Grade II diastolic dysfunction (pseudonormalization). Elevated left atrial pressure. There is the interventricular septum is flattened in systole and diastole, consistent with right ventricular pressure and volume overload.  2. RV not well viusalized. Grossly appears mildly enlarged with mildly decreased function. Right ventricular systolic function was not well visualized. The right ventricular size is not well visualized. Tricuspid regurgitation signal is inadequate for assessing PA pressure.  3. Left atrial size was mildly dilated.  4. The mitral valve is abnormal. Mild mitral valve regurgitation. No evidence of mitral stenosis.  5. The tricuspid valve is abnormal.  6. The aortic valve is tricuspid. There is mild calcification of the aortic valve. There is mild thickening of the aortic valve. Aortic valve regurgitation is not visualized. No aortic stenosis is present.  7. The pulmonic valve was abnormal. FINDINGS  Left Ventricle: Left ventricular ejection fraction, by estimation, is 20 to 25%. The left ventricle has severely decreased function. The left ventricle demonstrates global hypokinesis. The left ventricular internal cavity size was normal in size. There is no left ventricular hypertrophy. The interventricular septum is flattened in systole and diastole, consistent with right ventricular pressure and volume overload. Left ventricular diastolic parameters are consistent with Grade II diastolic dysfunction  (pseudonormalization). Elevated left atrial pressure.  LV Wall Scoring: The entire apex is akinetic. Right Ventricle: RV not well viusalized. Grossly appears mildly enlarged with mildly decreased function. The right ventricular size is not well visualized. Right vetricular wall thickness was not well visualized. Right ventricular systolic  function was not well visualized. Tricuspid regurgitation signal is inadequate for assessing PA pressure. Left Atrium: Left atrial size was mildly dilated. Right Atrium: Right atrial size was normal in size. Pericardium: There is no evidence of pericardial effusion. Mitral Valve: The mitral valve is abnormal. Mild mitral valve regurgitation. No evidence of mitral valve stenosis. Tricuspid Valve: The tricuspid valve is abnormal. Tricuspid valve regurgitation is mild . No evidence of tricuspid stenosis. Aortic Valve: The aortic valve is tricuspid. There is mild calcification of the aortic valve. There is mild thickening of the aortic valve. There is mild aortic valve annular calcification. Aortic valve regurgitation is not visualized. No aortic stenosis  is present. Aortic valve mean gradient measures 3.4 mmHg. Aortic valve peak gradient measures 6.4 mmHg. Aortic valve area, by VTI measures 2.54 cm. Pulmonic Valve: The pulmonic valve was abnormal. Pulmonic valve regurgitation is mild. No evidence of pulmonic stenosis. Aorta: The aortic root and ascending aorta are structurally normal, with no evidence of dilitation. IAS/Shunts: The interatrial septum was not well visualized.  LEFT VENTRICLE PLAX 2D LVIDd:         5.50 cm   Diastology LVIDs:         4.80 cm   LV e' medial:    5.78 cm/s LV PW:         0.80 cm   LV E/e' medial:  18.5 LV IVS:        1.00 cm   LV e' lateral:   8.93 cm/s LVOT diam:     2.20 cm   LV E/e' lateral: 12.0 LV SV:         56 LV SV Index:   24 LVOT Area:     3.80 cm  RIGHT VENTRICLE RV S prime:     10.20 cm/s TAPSE (M-mode): 1.1 cm LEFT ATRIUM             Index        RIGHT ATRIUM           Index LA diam:        4.90 cm 2.12 cm/m   RA Area:     15.00 cm LA Vol (A2C):   73.6 ml 31.87 ml/m  RA Volume:   34.60 ml  14.98 ml/m LA Vol (A4C):   42.2 ml 18.27 ml/m LA Biplane Vol: 56.9 ml 24.64 ml/m  AORTIC VALVE AV Area (Vmax):    2.77 cm AV  Area (Vmean):   2.26 cm AV Area (VTI):     2.54 cm AV  Vmax:           126.27 cm/s AV Vmean:          86.342 cm/s AV VTI:            0.222 m AV Peak Grad:      6.4 mmHg AV Mean Grad:      3.4 mmHg LVOT Vmax:         91.95 cm/s LVOT Vmean:        51.233 cm/s LVOT VTI:          0.148 m LVOT/AV VTI ratio: 0.67  AORTA Ao Root diam: 3.20 cm Ao Asc diam:  2.90 cm MITRAL VALVE MV Area (PHT): 4.89 cm     SHUNTS MV Decel Time: 155 msec     Systemic VTI:  0.15 m MR Peak grad: 34.5 mmHg     Systemic Diam: 2.20 cm MR Vmax:      293.50 cm/s MV E velocity: 107.00 cm/s MV A velocity: 46.50 cm/s MV E/A ratio:  2.30 Dina Rich MD Electronically signed by Dina Rich MD Signature Date/Time: 10/21/2022/3:48:50 PM    Final    DG Chest Portable 1 View  Result Date: 10/21/2022 CLINICAL DATA:  Difficulty breathing.  Feet and leg swelling. EXAM: PORTABLE CHEST 1 VIEW COMPARISON:  11/12/2021. FINDINGS: Mild diffuse increased interstitial markings. Redemonstration of small right pleural effusion with associated partial obscuration of right medial hemidiaphragm and right heart border, favoring combination of right lung atelectasis and/or consolidation. Bilateral lungs are otherwise clear. Left lateral costophrenic angle is clear. Stable mildly enlarged cardio-mediastinal silhouette. No acute osseous abnormalities. The soft tissues are within normal limits. IMPRESSION: 1. Mild congestive heart failure/pulmonary edema. 2. Stable small right pleural effusion with probable associated right lung atelectasis and/or consolidation. Correlate clinically. 3. Stable mild cardiomegaly. Electronically Signed   By: Jules Schick M.D.   On: 10/21/2022 11:07    Catarina Hartshorn, DO  [facility]  If 7PM-7AM, please contact night-coverage [email or web address] Password [email or web address] 11/13/2022, 12:20 PM   LOS: 10 days

## 2022-11-14 ENCOUNTER — Inpatient Hospital Stay (HOSPITAL_COMMUNITY)
Admit: 2022-11-14 | Discharge: 2022-11-14 | Disposition: A | Discharge status: Home / Self Care | Payer: No Typology Code available for payment source | Attending: Neurology

## 2022-11-14 ENCOUNTER — Encounter (HOSPITAL_COMMUNITY): Payer: Self-pay | Admitting: Gastroenterology

## 2022-11-14 DIAGNOSIS — D329 Benign neoplasm of meninges, unspecified: Secondary | ICD-10-CM

## 2022-11-14 DIAGNOSIS — J9601 Acute respiratory failure with hypoxia: Secondary | ICD-10-CM | POA: Diagnosis not present

## 2022-11-14 DIAGNOSIS — N1831 Chronic kidney disease, stage 3a: Secondary | ICD-10-CM | POA: Diagnosis not present

## 2022-11-14 DIAGNOSIS — F17200 Nicotine dependence, unspecified, uncomplicated: Secondary | ICD-10-CM | POA: Diagnosis not present

## 2022-11-14 DIAGNOSIS — R569 Unspecified convulsions: Secondary | ICD-10-CM

## 2022-11-14 DIAGNOSIS — I5023 Acute on chronic systolic (congestive) heart failure: Secondary | ICD-10-CM | POA: Diagnosis not present

## 2022-11-14 LAB — CBC
HCT: 28.9 % — ABNORMAL LOW (ref 39.0–52.0)
Hemoglobin: 8.6 g/dL — ABNORMAL LOW (ref 13.0–17.0)
MCH: 23.6 pg — ABNORMAL LOW (ref 26.0–34.0)
MCHC: 29.8 g/dL — ABNORMAL LOW (ref 30.0–36.0)
MCV: 79.2 fL — ABNORMAL LOW (ref 80.0–100.0)
Platelets: 273 10*3/uL (ref 150–400)
RBC: 3.65 MIL/uL — ABNORMAL LOW (ref 4.22–5.81)
RDW: 22.1 % — ABNORMAL HIGH (ref 11.5–15.5)
WBC: 17.5 10*3/uL — ABNORMAL HIGH (ref 4.0–10.5)
nRBC: 0.3 % — ABNORMAL HIGH (ref 0.0–0.2)

## 2022-11-14 LAB — SURGICAL PATHOLOGY

## 2022-11-14 LAB — GLUCOSE, CAPILLARY
Glucose-Capillary: 196 mg/dL — ABNORMAL HIGH (ref 70–99)
Glucose-Capillary: 301 mg/dL — ABNORMAL HIGH (ref 70–99)
Glucose-Capillary: 235 mg/dL — ABNORMAL HIGH (ref 70–99)
Glucose-Capillary: 134 mg/dL — ABNORMAL HIGH (ref 70–99)

## 2022-11-14 MED ORDER — INSULIN ASPART 100 UNIT/ML IJ SOLN
10.0000 [IU] | Freq: Three times a day (TID) | INTRAMUSCULAR | Status: DC
  Administered 2022-11-14 – 2022-11-15 (×3): 10 [IU] via SUBCUTANEOUS

## 2022-11-14 NOTE — Progress Notes (Signed)
I reviewed the pathology results. Ann, can you send her a letter with the findings as described below please? Repeat colonoscopy in 2 months.  Thanks,  Vista Lawman, MD Gastroenterology and Hepatology [facility]  ---------------------------------------------------------------------------------------------  [facility] [address], [address], [place], [place] [address] Phone:  [phone]   11/14/22 [place], [place]   Dear [name],  I am writing to inform you that the biopsies taken during your recent endoscopic examination showed:  No H. Pylori bacteria in stomach , or any early cancer changes to the stomach mucosa ( Intestinal metaplasia)   I am writing to let you know the results of your recent colonoscopy.  You had a total of 4 large polyps removed. The pathology came back as "tubular adenoma." These findings are NOT cancer, but had the polyps remained in your colon, they could have turned into cancer.  Given these findings, it is recommended that your next colonoscopy be performed in 1-3 months because prep of your colon was not adequate and we found large polyps.  Please call us at [phone] if you have persistent problems or have questions about your condition that have not been fully answered at this time.  Sincerely,  Vista Lawman, MD Gastroenterology and Hepatology

## 2022-11-14 NOTE — Procedures (Signed)
Patient Name: [name]  MRN: [id number]  Epilepsy Attending: Charlsie Quest  Referring Physician/Provider: Charlsie Quest, MD Date: 11/14/2022 Duration: 23.37 mins  Patient history: 67 year old male with right temporal meningioma with cerebral edema as noted above with intermittent dysarthria and stuttering speech. EEG to evaluate for seizure  Level of alertness: Awake, drowsy  AEDs during EEG study: GBP  Technical aspects: This EEG study was done with scalp electrodes positioned according to the 10-20 International system of electrode placement. Electrical activity was reviewed with band pass filter of 1-70Hz , sensitivity of 7 uV/mm, display speed of 65mm/sec with a 60Hz  notched filter applied as appropriate. EEG data were recorded continuously and digitally stored.  Video monitoring was available and reviewed as appropriate.  Description: The posterior dominant rhythm consists of 7 Hz activity of moderate voltage (25-35 uV) seen predominantly in posterior head regions, symmetric and reactive to eye opening and eye closing. Drowsiness was characterized by attenuation of the posterior background rhythm. EEG showed continuous generalized and maximal right temporal 3 to 6 Hz theta-delta slowing. Hyperventilation and photic stimulation were not performed.     ABNORMALITY - Continuous slow, generalized and maximal right temporal   IMPRESSION: This study is suggestive of cortical dysfunction arising from right temporal region likely secondary to underlying  meningioma. Additionally there is mild to moderate diffuse encephalopathy. No seizures or epileptiform discharges were seen throughout the recording.  Nefi Musich Annabelle Harman

## 2022-11-14 NOTE — Plan of Care (Signed)
  Problem: Coping: Goal: Level of anxiety will decrease Outcome: Progressing   Problem: Pain Management: Goal: General experience of comfort will improve Outcome: Progressing   Problem: Nutrition: Goal: Adequate nutrition will be maintained Outcome: Completed/Met

## 2022-11-14 NOTE — Progress Notes (Signed)
EEG complete - results pending 

## 2022-11-14 NOTE — Progress Notes (Signed)
PROGRESS NOTE  ZEBULIN SIEGEL RUE:[id number] DOB: [birth year] DOA: 11/02/2022 PCP: Benetta Spar, MD  Brief History:   67 y.o. male with a history of CAD, tobacco use, HTN, HLD, T2DM, GERD, history of cocaine use, OSA, and combined CHF (LVEF 20-25%, G2DD) who presented to the ED 10/23 with migratory abdominal/back/pelvic pain and dyspnea. He was admitted for recurrent acute on chronic combined CHF, [place] +cocaine. IV diuresis is underway with cardiology consultation. Developed delirium 10/25, head CT nonacute. Mentation has cleared up a bit but not at baseline. CBG shows no new hypercarbia. MR brain shows chronic meningioma with edema without midline shift.  Hospitalization has been prolonged secondary to fluid overload requiring aggressive diuresis as well as drop in hemoglobin.  Patient was transfused 2 unit PRBC total for the hospitalization .  GI was consulted assist with management.  colonoscopy--3 polyps in transverse colon, one in ascending colon;  iron deficiency due to large polyps with one with visible oozing. Slow response to diuresis in part due to patient's dietary indiscretion as he ordered outside food.   Assessment/Plan: Acute hypoxic respiratory failure due to acute on chronic HFrEF, pleural effusions:  - initially on 2L - oxygenation improved with diuresis - Continue to stress incentive spirometry, ambulate the patient as atelectasis is contributing. -now stable on RA, but uses oxygen for comfort -11/02/22 CTA chest--no dissection; +pleural eff, +pulmonary edema -11/02/22 CTA abd--chronic occlusion of left common iliac with reconstitution of left fem artery;  no acute intraabdominal process   Acute metabolic encephalopathy, delirium:  -Possibly element of hospital delirium and/or cocaine withdrawal.  - CT head >> no acute findings, though chronic microvascular disease and meningioma decrease threshold for confusion.  - LFT's/ammonia wnl.  - 10/28 VBG  7.38/52/<31 - No urinary retention noted per RN - 10/27 MR brain--slight interval growth of meningioma with progressive edema of R-temporal lobe - Keep delirium precautions and minimize sedating medications if able.  -10/31 UA/culture--no pyuria -B12--576, folate 7.4 -TSH 1.349 -Ammonia - <10 -Given thiamine 500 mg, 6 doses; continue thiamine 100 mg daily -Mental status partly functional   Speech disturbance -MRI brain negative for stroke--showed a small meningioma and chronic cerebral edema -No focal deficits -Occurs intermittently during the hospitalization -pt states it's worse when he's stressed -appreciate neurology consult>>EEG   CKD 3a -baseline creatinine 1.4-1.7 -monitor with diuresis   Acute on chronic combined HFrEF and RV failure:  - Unclear whether he was taking cardiac medications PTA, though he reports he was. Given severity of biventricular failure, cardiology consulted for advice.  -appreciate cardiology  - Monitor I/O (actually turned a positive balance when PM lasix dose held), ReDS vest >40% on 10/28  - remains clinically fluid overloaded>>continued IV lasix - Given cocaine positivity, stopped beta blocker initially -10/21/22 Echo--EF 20-25%, G2DD, mild MR -transitioned to po torsemide 10/31     Meningioma: Extra-axial brain mass known since 2023. Had mediastinal lymphadenopathy and pleural effusion sampled during that admission which did not show malignancy. Plan per neurosurgery was to biopsy brain mass if those were negative.  - Given persistent encephalopathy and cerebral edema suggested by CT, we checked MRI w/ and w/o contrast which shows increased size in meningioma with chronic cerebral edema. Dr. Jarvis Newcomer spoke with Dr. Maurice Small who initially saw this patient. He reviewed updated images and history. Recommendation is for outpatient follow up, though with small size and appearance so characteristic of meningioma, no intervention is anticipated. Given the  confusion and cerebral edema, he was ok with short trial of decadron which we have pursued.    Cocaine abuse: +UDS - Cessation counseling   Acute on Chronic microcytic anemia:  -Hgb 10>>7.4 though no gross bleeding is noted. BUN up, though likely due to steroids and pt is on PPI.   - iron saturation 8%, ferritin 13 -foalte 7.4, B12 576 - Increase PPI to BID, check  -FOBT positive. Only record of GI evaluation was colonoscopy 2012 showing 2 sigmoid polyps, external hemorrhoids.  - Ok to continue lovenox VTE ppx for now unless bleeding is noted. -GI consult appreciated -10/30--transfuse one unit PRBC -11/2--transfuse one unit PRBC -11/1 EGD--ertythematous mucosa in antrum, +duodenitis -11/1 colonoscopy--3 polyps in transverse colon, one in ascending colon;  iron deficiency due to large polyps with one with visible oozing -11/1--give venofer x 1 -start ferrous sulfate in 11/2 -No signs of active bleeding   Scrotal pain, history of Fournier's gangrene: U/S reassuring (stable epididymal cyst), ?if hydrocele from overload contributing. Also note Dx from PCP "scrotal pain, chronic, bilateral" a year ago.  - Will discontinue SGLT2i permanently. - Supportive care, urology follow up.   OSA:  - CPAP at bedtime   Peripheral neuropathy:  - Continue gabapentin   CAD, HTN: No chest pain. Later did have atypical chest pain likely musculoskeletal with persistently negative troponin and no ischemic ECG changes.  - Continue ASA, statin, BB   Cellulitis: Localized induration in areas where he may have injured the skin. No fluctuance/indication for I&D at this time.  - Treating with keflex.  - Also has likely dermal inclusion cyst on left posterior neck that does not appear infected.  -improved   PAD: Chronic left common iliac occlusion on CTA with distal reconstution.  - Needs vascular surgery evaluation as outpatient.      T2DM:  - SSI.  -10/21/22 HbA1c was 6.3%, seeing some steroid-induced  hyperglycemia  -change to resistant SSI  -add low dose semglee and premeal insulin   Tobacco use:  - Nicotine patch - Cessation counseling   Alcohol dependence -start high dose thiamine -may have a degree of Wernicke-Korsakoff syndrome      Family Communication:   daughter updated 11/4   Consultants:  cardiology, GI   Code Status:  FULL    DVT Prophylaxis:   [place] Lovenox     Procedures: As Listed in Progress Note Above   Antibiotics: Cephalexin--received 10 days           Subjective:  Patient denies fevers, chills, headache, chest pain, dyspnea, nausea, vomiting, diarrhea, abdominal pain, dysuria, hematuria, hematochezia, and melena.  Objective: Vitals:   11/13/22 2035 11/14/22 0400 11/14/22 0430 11/14/22 0827  BP: 126/89 112/71  110/85  Pulse: 90 76  73  Resp: 18 17    Temp: 98.6 F (37 C) 97.7 F (36.5 C)  (!) 97.5 F (36.4 C)  TempSrc:    Oral  SpO2: 96% 100%  99%  Weight:   110.2 kg   Height:        Intake/Output Summary (Last 24 hours) at 11/14/2022 1305 Last data filed at 11/14/2022 0900 Gross per 24 hour  Intake 880 ml  Output --  Net 880 ml   Weight change: 2 kg Exam:  General:  Pt is alert, follows commands appropriately, not in acute distress HEENT: No icterus, No thrush, No neck mass, [place]/AT Cardiovascular: RRR, S1/S2, no rubs, no gallops Respiratory: CTA bilaterally, no wheezing, no crackles, no rhonchi Abdomen: Soft/+BS, non tender,  non distended, no guarding Extremities: 1 +LE edema, No lymphangitis, No petechiae, No rashes, no synovitis   Data Reviewed: I have personally reviewed following labs and imaging studies Basic Metabolic Panel: Recent Labs  Lab 11/09/22 0503 11/10/22 0448 11/11/22 0414 11/12/22 0448 11/13/22 0408  NA 133* 132* 133* 130* 131*  K 4.4 4.3 4.4 4.3 4.5  CL 98 95* 98 98 99  CO2 27 26 28 23 24   GLUCOSE 201* 266* 119* 361* 259*  BUN 43* 52* 49* 55* 48*  CREATININE 1.61* 1.69* 1.60* 2.00* 1.83*   CALCIUM 8.5* 8.6* 8.4* 8.1* 7.8*  MG  --  2.3  --   --   --    Liver Function Tests: Recent Labs  Lab 11/11/22 0414  AST 67*  ALT 52*  ALKPHOS 102  BILITOT 0.8  PROT 7.3  ALBUMIN 2.7*   No results for input(s): "LIPASE", "AMYLASE" in the last 168 hours. Recent Labs  Lab 11/10/22 2128  AMMONIA <10   Coagulation Profile: No results for input(s): "INR", "PROTIME" in the last 168 hours. CBC: Recent Labs  Lab 11/10/22 0448 11/11/22 0414 11/12/22 0448 11/12/22 1634 11/13/22 0408 11/14/22 0457  WBC 16.0* 12.5* 13.9*  --  14.1* 17.5*  NEUTROABS  --  10.8*  --   --   --   --   HGB 9.0* 8.1* 7.7* 9.2* 8.4* 8.6*  HCT 30.4* 27.9* 25.7* 30.5* 28.5* 28.9*  MCV 77.6* 77.5* 77.4*  --  78.1* 79.2*  PLT 304 261 249  --  267 273   Cardiac Enzymes: No results for input(s): "CKTOTAL", "CKMB", "CKMBINDEX", "TROPONINI" in the last 168 hours. BNP: Invalid input(s): "POCBNP" CBG: Recent Labs  Lab 11/13/22 1530 11/13/22 1614 11/13/22 1946 11/14/22 0757 11/14/22 1116  GLUCAP 150* 134* 238* 196* 301*   HbA1C: No results for input(s): "HGBA1C" in the last 72 hours. Urine analysis:    Component Value Date/Time   COLORURINE STRAW (A) 11/10/2022 2008   APPEARANCEUR CLEAR 11/10/2022 2008   APPEARANCEUR Clear 08/02/2021 1051   LABSPEC 1.006 11/10/2022 2008   PHURINE 5.0 11/10/2022 2008   GLUCOSEU NEGATIVE 11/10/2022 2008   HGBUR NEGATIVE 11/10/2022 2008   BILIRUBINUR NEGATIVE 11/10/2022 2008   BILIRUBINUR Negative 08/02/2021 1051   KETONESUR NEGATIVE 11/10/2022 2008   PROTEINUR NEGATIVE 11/10/2022 2008   UROBILINOGEN 0.2 09/27/2014 2237   NITRITE NEGATIVE 11/10/2022 2008   LEUKOCYTESUR NEGATIVE 11/10/2022 2008   Sepsis Labs: @LABRCNTIP (procalcitonin:4,lacticidven:4) )No results found for this or any previous visit (from the past 240 hour(s)).   Scheduled Meds:  aspirin EC  81 mg Oral Daily   atorvastatin  40 mg Oral QHS   dexamethasone  4 mg Oral Daily   enoxaparin  (LOVENOX) injection  40 mg Subcutaneous Q24H   ferrous sulfate  325 mg Oral Q breakfast   folic acid  1 mg Oral Daily   gabapentin  300 mg Oral TID   insulin aspart  0-20 Units Subcutaneous TID WC   insulin aspart  0-5 Units Subcutaneous QHS   insulin aspart  10 Units Subcutaneous TID WC   insulin glargine-yfgn  5 Units Subcutaneous Daily   lidocaine  1 patch Transdermal Q24H   linaclotide  145 mcg Oral QAC breakfast   nicotine  21 mg Transdermal Daily   pantoprazole  40 mg Oral BID   polyethylene glycol-electrolytes  4,000 mL Oral Once   sodium chloride flush  3 mL Intravenous Q12H   thiamine  100 mg Oral Daily   torsemide  20 mg Oral Daily   Continuous Infusions:  Procedures/Studies: US Abdomen Limited RUQ (LIVER/GB)  Result Date: 11/11/2022 CLINICAL DATA:  Abdominal pain.  Transaminitis EXAM: ULTRASOUND ABDOMEN LIMITED RIGHT UPPER QUADRANT COMPARISON:  CT 11/01/2022. FINDINGS: Gallbladder: Mildly distended gallbladder. Dependent stones. No adjacent fluid. Slightly thickened wall of up to 4 mm. No reported sonographic Murphy's sign. Common bile duct: Diameter: 3 mm Liver: No focal lesion identified. Within normal limits in parenchymal echogenicity. Portal vein is patent on color Doppler imaging with normal direction of blood flow towards the liver. Other: None. IMPRESSION: Gallbladder mildly distended with stones. Slight wall thickening. No ductal dilatation. Please correlate for other clinical presentation of acute cholecystitis and if needed confirmatory HIDA scan as clinically appropriate Electronically Signed   By: Karen Kays M.D.   On: 11/11/2022 13:56   DG CHEST PORT 1 VIEW  Result Date: 11/07/2022 CLINICAL DATA:  Acute respiratory failure, hypoxia EXAM: PORTABLE CHEST 1 VIEW COMPARISON:  11/02/2022 FINDINGS: Single frontal view of the chest demonstrates an enlarged cardiac silhouette. Mild pulmonary vascular congestion, with minimal bibasilar airspace disease left greater than  right. Trace left effusions are suspected. No pneumothorax. No acute bony abnormalities. IMPRESSION: 1. Findings consistent with mild congestive heart failure. Electronically Signed   By: Sharlet Salina M.D.   On: 11/07/2022 15:42   MR BRAIN W WO CONTRAST  Result Date: 11/06/2022 CLINICAL DATA:  Meningioma follow-up. EXAM: MRI HEAD WITHOUT AND WITH CONTRAST TECHNIQUE: Multiplanar, multiecho pulse sequences of the brain and surrounding structures were obtained without and with intravenous contrast. CONTRAST:  10mL GADAVIST GADOBUTROL 1 MMOL/ML IV SOLN COMPARISON:  CT head without contrast 11/05/2022. Head without and with contrast 11/08/2021. FINDINGS: Brain: A meningioma along the medial aspect of the right middle cranial fossa demonstrates slight interval growth now measuring 3.0 x 1.9 x 2.1 cm. The meningioma does not extend into the right cavernous sinus and is separate from the right orbital apex. Progressive edema is present throughout the right temporal lobe. A remote infarct is present in the right occipital lobe. Moderate generalized atrophy and white matter disease is present bilaterally. The ventricles are proportionate to the degree of atrophy. No significant extraaxial fluid collection is present. Postcontrast images demonstrate no other pathologic enhancement. White matter changes extend into the brainstem. Remote lacunar infarcts are present in the cerebellum bilaterally. The internal auditory canals are within normal limits. Midline structures are within normal limits. Vascular: Flow is present in the major intracranial arteries. Skull and upper cervical spine: Degenerative changes are present in the upper cervical spine. The craniocervical junction is normal. Marrow signal is normal. Sinuses/Orbits: Bilateral mastoid effusions are present. No obstructing nasopharyngeal lesion is present. The paranasal sinuses and mastoid air cells are otherwise clear. IMPRESSION: 1. Slight interval growth of  meningioma along the medial aspect of the right middle cranial fossa now measuring 3.0 x 1.9 x 2.1 cm. 2. Progressive edema throughout the right temporal lobe. 3. Remote infarct of the right occipital lobe. 4. Remote lacunar infarcts of the cerebellum bilaterally. 5. Moderate generalized atrophy and white matter disease likely reflects the sequela of chronic microvascular ischemia. 6. Bilateral mastoid effusions. No obstructing nasopharyngeal lesion is present. Electronically Signed   By: Marin Roberts M.D.   On: 11/06/2022 19:44   CT HEAD WO CONTRAST ( )  Result Date: 11/05/2022 CLINICAL DATA:  67 year old male altered mental status. EXAM: CT HEAD WITHOUT CONTRAST TECHNIQUE: Contiguous axial images were obtained from the base of the skull through the vertex without intravenous  contrast. RADIATION DOSE REDUCTION: This exam was performed according to the departmental dose-optimization program which includes automated exposure control, adjustment of the mA and/or kV according to patient size and/or use of iterative reconstruction technique. COMPARISON:  Brain MRI 11/08/2021.  Head CT 11/07/2021. FINDINGS: Brain: Chronic abnormal hypodensity in the right temporal lobe tracking into the deep white matter capsules, most resembles vasogenic edema. Chronic enhancing mass at the mesial right temporal lobe, lateral to the cavernous sinus on MRI last year, occult by plain CT. Stable underlying cerebral volume and superimposed chronic right PCA territory encephalomalacia. Stable chronic small vessel disease, patchy additional white matter and thalamic involvement. No midline shift or new intracranial mass effect. No acute cortically based infarct. No acute intracranial hemorrhage identified. Vascular: Calcified atherosclerosis at the skull base. No suspicious intracranial vascular hyperdensity. Skull: No acute osseous abnormality identified. Sinuses/Orbits: Visualized paranasal sinuses and mastoids are stable and  well aerated. Other: Visualized orbits and scalp soft tissues are within normal limits. IMPRESSION: 1. Chronically abnormal right middle cranial fossa where vasogenic edema in the right temporal lobe appears related to enhancing mass lateral to the right cavernous sinus on MRI last year (occult by CT). Stability and time course favor a Meningioma with chronic cerebral edema. No midline shift or progression by noncontrast CT. 2. No acute intracranial abnormality. Underlying chronic right PCA territory infarct. Electronically Signed   By: Odessa Fleming M.D.   On: 11/05/2022 12:36   DG Abd 1 View  Result Date: 11/03/2022 CLINICAL DATA:  Abdominal pain.  Constipation. EXAM: ABDOMEN - 1 VIEW COMPARISON:  CTA yesterday.  Abdominopelvic CT 2 days ago FINDINGS: Bilateral pleural effusions. Cardiomegaly. Moderate volume of colonic stool. No bowel obstruction. There are vascular calcifications. Excreted IV contrast in the urinary bladder. IMPRESSION: 1. Moderate colonic stool burden. No bowel obstruction. 2. Bilateral pleural effusions. Cardiomegaly. Electronically Signed   By: Narda Rutherford M.D.   On: 11/03/2022 23:40   CT Angio Chest/Abd/Pel for Dissection W and/or Wo Contrast  Result Date: 11/02/2022 CLINICAL DATA:  Chest pain.  Acute aortic syndrome EXAM: CT ANGIOGRAPHY CHEST, ABDOMEN AND PELVIS TECHNIQUE: Non-contrast CT of the chest was initially obtained. Multidetector CT imaging through the chest, abdomen and pelvis was performed using the standard protocol during bolus administration of intravenous contrast. Multiplanar reconstructed images and MIPs were obtained and reviewed to evaluate the vascular anatomy. RADIATION DOSE REDUCTION: This exam was performed according to the departmental dose-optimization program which includes automated exposure control, adjustment of the mA and/or kV according to patient size and/or use of iterative reconstruction technique. CONTRAST:  OMNIPAQUE IOHEXOL 350 MG/ML SOLN  COMPARISON:  CT chest abdomen pelvis 11/07/2021 CT abdomen 11/01/2022 FINDINGS: CTA CHEST FINDINGS Cardiovascular: Noncontrast series demonstrates no intramural hematoma within the thoracic aorta. Contrast series demonstrates no aortic dissection or aneurysm. Great vessels normal. No pericardial fluid. Mediastinum/Nodes: Enlarged mediastinal lymph nodes are not changed from CT 1 year prior. For example LEFT lower paratracheal lymph node measuring 17 mm compared to 16 mm. RIGHT lower paratracheal node measuring 16 mm compares to 20 mm. Lungs/Pleura: Bilateral pleural effusions. Moderate effusion on the RIGHT and small effusion on the LEFT. Mild diffuse ground-glass density in the lungs suggest mild pulmonary edema. Mild interstitial thickening chest mild interstitial edema. No pneumothorax. No pneumonia. Musculoskeletal: No aggressive osseous lesion. Review of the MIP images confirms the above findings. CTA ABDOMEN AND PELVIS FINDINGS VASCULAR Aorta: Normal caliber aorta without aneurysm, dissection, vasculitis or significant stenosis. Celiac: Patent without evidence of aneurysm,  dissection, vasculitis or significant stenosis. SMA: Patent without evidence of aneurysm, dissection, vasculitis or significant stenosis. Renals: Both renal arteries are patent without evidence of aneurysm, dissection, vasculitis, fibromuscular dysplasia or significant stenosis. IMA: Patent without evidence of aneurysm, dissection, vasculitis or significant stenosis. 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 Reconstitution of the LEFT femoral artery. No interval change. Veins: No obvious venous abnormality within the limitations of this arterial phase study. Review of the MIP images confirms the above findings. NON-VASCULAR Lower chest: Lung bases are clear. Hepatobiliary: No focal hepatic lesion. Normal gallbladder. No biliary duct dilatation. Common bile duct is normal. Pancreas: Pancreas is normal. No ductal dilatation.  No pancreatic inflammation. Spleen: Normal spleen Adrenals/urinary tract: Adrenal glands and kidneys are normal. The ureters and bladder normal. Stomach/Bowel: Stomach, small bowel, appendix, and cecum are normal. The colon and rectosigmoid colon are normal. Vascular/Lymphatic: Abdominal aorta is normal caliber. No periportal or retroperitoneal adenopathy. No pelvic adenopathy. Reproductive: Prostate unremarkable Other: No free fluid. Musculoskeletal: No aggressive osseous lesion. Review of the MIP images confirms the above findings. IMPRESSION: CHEST: 1. No evidence of aortic dissection or aneurysm. 2. Bilateral pleural effusions and mild pulmonary edema. 3. Stable mediastinal adenopathy. PELVIS: 1. No evidence of aortic dissection or aneurysm. 2. Chronic occlusion of the LEFT common iliac artery with reconstitution of the LEFT femoral artery. 3. No acute findings in the abdomen pelvis. Electronically Signed   By: Genevive Bi M.D.   On: 11/02/2022 11:25   DG Chest Portable 1 View  Result Date: 11/02/2022 CLINICAL DATA:  Shortness of breath EXAM: PORTABLE CHEST 1 VIEW COMPARISON:  Chest radiograph dated 10/21/2022 FINDINGS: Normal lung volumes. Mild bilateral interstitial opacities. No pleural effusion or pneumothorax. Similar enlarged cardiomediastinal silhouette. No acute osseous abnormality. IMPRESSION: 1. Mild bilateral interstitial opacities, which may represent pulmonary edema. 2. Similar cardiomegaly. Electronically Signed   By: Agustin Cree M.D.   On: 11/02/2022 09:43   CT ABDOMEN PELVIS W CONTRAST  Result Date: 11/01/2022 CLINICAL DATA:  Abdominal pain and flank pain.  Stone suspected EXAM: CT ABDOMEN AND PELVIS WITH CONTRAST TECHNIQUE: Multidetector CT imaging of the abdomen and pelvis was performed using the standard protocol following bolus administration of intravenous contrast. RADIATION DOSE REDUCTION: This exam was performed according to the departmental dose-optimization program which  includes automated exposure control, adjustment of the mA and/or kV according to patient size and/or use of iterative reconstruction technique. CONTRAST:  OMNIPAQUE IOHEXOL 300 MG/ML  SOLN COMPARISON:  None Available. FINDINGS: Lower chest: Moderate pleural effusion Hepatobiliary: No focal hepatic lesion. Two tiny gallstones measuring less than 5 mm each. No gallbladder distension. No biliary duct dilatation. Common bile duct is normal. Pancreas: Pancreas is normal. No ductal dilatation. No pancreatic inflammation. Spleen: Normal spleen Adrenals/urinary tract: Adrenal glands normal. No nephrolithiasis or ureterolithiasis. Perinephric stranding similar comparison CT. No renal obstruction on delayed imaging. Bladder normal. Stomach/Bowel: Stomach, small-bowel and cecum are normal. The appendix is not identified but there is no pericecal inflammation to suggest appendicitis. Insert Vascular/Lymphatic: Abdominal aorta is normal caliber with atherosclerotic calcification. There is no retroperitoneal or periportal lymphadenopathy. No pelvic lymphadenopathy. Reproductive: Prostate normal Other: small volume of free fluid along the LEFT and RIGHT pericolic gutter. Musculoskeletal: No aggressive osseous lesion. IMPRESSION: 1. No nephrolithiasis or ureterolithiasis. 2. Perinephric stranding similar to comparison CT. No renal obstruction on delayed imaging. 3. Moderate pleural effusion. 4. Small volume of free fluid along the LEFT and RIGHT pericolic gutter. 5. Cholelithiasis without evidence of cholecystitis.  6.  Aortic Atherosclerosis (ICD10-I70.0). Electronically Signed   By: Genevive Bi M.D.   On: 11/01/2022 16:45   US SCROTUM W/DOPPLER  Result Date: 11/01/2022 CLINICAL DATA:  Testicular pain EXAM: SCROTAL ULTRASOUND DOPPLER ULTRASOUND OF THE TESTICLES TECHNIQUE: Complete ultrasound examination of the testicles, epididymis, and other scrotal structures was performed. Color and spectral Doppler ultrasound  were also utilized to evaluate blood flow to the testicles. COMPARISON:  None Available. FINDINGS: Right testicle Measurements: 3.3 x 1.6 x 2.8 cm. No mass or microlithiasis visualized. Left testicle Measurements: 3.4 x 2.2 x 2.2 cm. No mass or microlithiasis visualized. Right epididymis:  Normal in size and appearance. Left epididymis: Epididymal head cyst with internal echogenic debris measuring 1.1 x 1.0 x 1.0 cm. Hydrocele:  Small left hydrocele. Varicocele:  None visualized. Pulsed Doppler interrogation of both testes demonstrates normal low resistance arterial and venous waveforms bilaterally. IMPRESSION: 1. Complex left epididymal head cyst measuring up to 1.1 cm. 2. Small left hydrocele. Electronically Signed   By: Allegra Lai M.D.   On: 11/01/2022 11:46   ECHOCARDIOGRAM COMPLETE  Result Date: 10/21/2022    ECHOCARDIOGRAM REPORT   Patient Name:   FREDIS [id number] Date of Exam: 10/21/2022 Medical Rec #:  [id number]        Height:       69.0 in Accession #:    [id number]       Weight:       260.0 lb Date of Birth:  08-Sep-1955        BSA:          2.310 m Patient Age:    67 years         BP:           104/78 mmHg Patient Gender: M                HR:           74 bpm. [facility]am [facility] Procedure: 2D Echo, Cardiac Doppler and Color Doppler Indications:    CHF- Acute Systolic I50.21  History:        Patient has prior history of Echocardiogram examinations, most                 recent 11/07/2021. CHF and Cardiomyopathy, Angina and Previous                 Myocardial Infarction, Stroke and PAD, Arrythmias:Tachycardia,                 Signs/Symptoms:Chest Pain; Risk Factors:Hypertension, Sleep                 Apnea, Diabetes and Dyslipidemia. CKD, stage 3.  Sonographer:    Lucendia Herrlich RCS Referring Phys: [phone] CARLOS MADERA  Sonographer Comments: Image acquisition challenging due to uncooperative patient. IMPRESSIONS  1. Left ventricular ejection fraction, by estimation, is 20 to 25%. The left  ventricle has severely decreased function. The left ventricle demonstrates global hypokinesis. Left ventricular diastolic parameters are consistent with Grade II diastolic dysfunction (pseudonormalization). Elevated left atrial pressure. There is the interventricular septum is flattened in systole and diastole, consistent with right ventricular pressure and volume overload.  2. RV not well viusalized. Grossly appears mildly enlarged with mildly decreased function. Right ventricular systolic function was not well visualized. The right ventricular size is not well visualized. Tricuspid regurgitation signal is inadequate for assessing PA pressure.  3. Left atrial size was mildly dilated.  4. The mitral valve is abnormal.  Mild mitral valve regurgitation. No evidence of mitral stenosis.  5. The tricuspid valve is abnormal.  6. The aortic valve is tricuspid. There is mild calcification of the aortic valve. There is mild thickening of the aortic valve. Aortic valve regurgitation is not visualized. No aortic stenosis is present.  7. The pulmonic valve was abnormal. FINDINGS  Left Ventricle: Left ventricular ejection fraction, by estimation, is 20 to 25%. The left ventricle has severely decreased function. The left ventricle demonstrates global hypokinesis. The left ventricular internal cavity size was normal in size. There is no left ventricular hypertrophy. The interventricular septum is flattened in systole and diastole, consistent with right ventricular pressure and volume overload. Left ventricular diastolic parameters are consistent with Grade II diastolic dysfunction  (pseudonormalization). Elevated left atrial pressure.  LV Wall Scoring: The entire apex is akinetic. Right Ventricle: RV not well viusalized. Grossly appears mildly enlarged with mildly decreased function. The right ventricular size is not well visualized. Right vetricular wall thickness was not well visualized. Right ventricular systolic function was  not well visualized. Tricuspid regurgitation signal is inadequate for assessing PA pressure. Left Atrium: Left atrial size was mildly dilated. Right Atrium: Right atrial size was normal in size. Pericardium: There is no evidence of pericardial effusion. Mitral Valve: The mitral valve is abnormal. Mild mitral valve regurgitation. No evidence of mitral valve stenosis. Tricuspid Valve: The tricuspid valve is abnormal. Tricuspid valve regurgitation is mild . No evidence of tricuspid stenosis. Aortic Valve: The aortic valve is tricuspid. There is mild calcification of the aortic valve. There is mild thickening of the aortic valve. There is mild aortic valve annular calcification. Aortic valve regurgitation is not visualized. No aortic stenosis  is present. Aortic valve mean gradient measures 3.4 mmHg. Aortic valve peak gradient measures 6.4 mmHg. Aortic valve area, by VTI measures 2.54 cm. Pulmonic Valve: The pulmonic valve was abnormal. Pulmonic valve regurgitation is mild. No evidence of pulmonic stenosis. Aorta: The aortic root and ascending aorta are structurally normal, with no evidence of dilitation. IAS/Shunts: The interatrial septum was not well visualized.  LEFT VENTRICLE PLAX 2D LVIDd:         5.50 cm   Diastology LVIDs:         4.80 cm   LV e' medial:    5.78 cm/s LV PW:         0.80 cm   LV E/e' medial:  18.5 LV IVS:        1.00 cm   LV e' lateral:   8.93 cm/s LVOT diam:     2.20 cm   LV E/e' lateral: 12.0 LV SV:         56 LV SV Index:   24 LVOT Area:     3.80 cm  RIGHT VENTRICLE RV S prime:     10.20 cm/s TAPSE (M-mode): 1.1 cm LEFT ATRIUM             Index        RIGHT ATRIUM           Index LA diam:        4.90 cm 2.12 cm/m   RA Area:     15.00 cm LA Vol (A2C):   73.6 ml 31.87 ml/m  RA Volume:   34.60 ml  14.98 ml/m LA Vol (A4C):   42.2 ml 18.27 ml/m LA Biplane Vol: 56.9 ml 24.64 ml/m  AORTIC VALVE AV Area (Vmax):    2.77 cm AV Area (Vmean):   2.26 cm  AV Area (VTI):     2.54 cm AV Vmax:            126.27 cm/s AV Vmean:          86.342 cm/s AV VTI:            0.222 m AV Peak Grad:      6.4 mmHg AV Mean Grad:      3.4 mmHg LVOT Vmax:         91.95 cm/s LVOT Vmean:        51.233 cm/s LVOT VTI:          0.148 m LVOT/AV VTI ratio: 0.67  AORTA Ao Root diam: 3.20 cm Ao Asc diam:  2.90 cm MITRAL VALVE MV Area (PHT): 4.89 cm     SHUNTS MV Decel Time: 155 msec     Systemic VTI:  0.15 m MR Peak grad: 34.5 mmHg     Systemic Diam: 2.20 cm MR Vmax:      293.50 cm/s MV E velocity: 107.00 cm/s MV A velocity: 46.50 cm/s MV E/A ratio:  2.30 Dina Rich MD Electronically signed by Dina Rich MD Signature Date/Time: 10/21/2022/3:48:50 PM    Final    DG Chest Portable 1 View  Result Date: 10/21/2022 CLINICAL DATA:  Difficulty breathing.  Feet and leg swelling. EXAM: PORTABLE CHEST 1 VIEW COMPARISON:  11/12/2021. FINDINGS: Mild diffuse increased interstitial markings. Redemonstration of small right pleural effusion with associated partial obscuration of right medial hemidiaphragm and right heart border, favoring combination of right lung atelectasis and/or consolidation. Bilateral lungs are otherwise clear. Left lateral costophrenic angle is clear. Stable mildly enlarged cardio-mediastinal silhouette. No acute osseous abnormalities. The soft tissues are within normal limits. IMPRESSION: 1. Mild congestive heart failure/pulmonary edema. 2. Stable small right pleural effusion with probable associated right lung atelectasis and/or consolidation. Correlate clinically. 3. Stable mild cardiomegaly. Electronically Signed   By: Jules Schick M.D.   On: 10/21/2022 11:07    Catarina Hartshorn, DO  [facility]  If 7PM-7AM, please contact night-coverage [email or web address] Password [email or web address] 11/14/2022, 1:05 PM   LOS: 11 days

## 2022-11-14 NOTE — Consult Note (Addendum)
I connected with  [name] on 11/14/22 by a video enabled telemedicine application and verified that I am speaking with the correct person using two identifiers.   I discussed the limitations of evaluation and management by telemedicine. The patient expressed understanding and agreed to proceed.  Location of patient: [facility] Location of physician: [facility]  Neurology Consultation Reason for Consult: Dysarthria Referring Physician: Dr. Onalee Hua Tat  CC: Dysarthria  History is obtained from: Patient, daughter, chart review  HPI: [name] is a 67 y.o. male with past medical history of congestive heart failure, coronary artery disease, prior apical thrombus no longer on anticoagulation, cocaine use disorder, CKD, hyperlipidemia, hypertension, diabetes, meningioma who has had intermittent episodes of slurred speech.  Therefore neurology was consulted for further recommendations.  Patient states he feels like his speech is slurred and at times stuttering for about 1 week.  Denies any alteration of awareness or jerking with these episodes.  States he feels like he is stuttering more when he is stressed.  Denies any history of seizures.  Denies any headache.  Called and confirmed history with daughter who agrees.  Daughter also specifically denies any staring spells, jerking.  ROS: All other systems reviewed and negative except as noted in the HPI.  Past Medical History:  Diagnosis Date   Bulging lumbar disc    CAD (coronary artery disease) [phone]   a. prior LAD stenting. b. s/p DES to [facility] 08/2015. c. 04/2016 Cardiac cath at [facility]. Patent stent in the PLAD and RCA. Diffuse dLAD, OM2, and  RPDA disease. d. DES to PDA and distal RCA 03/2019    Chronic lower back pain    CKD (chronic kidney disease), stage II    Cocaine abuse (HCC)    Cyst of epididymis    DM2 (diabetes mellitus, type 2) (HCC)    Essential hypertension    Fournier gangrene    GERD  (gastroesophageal reflux disease)    Headache    History of pneumonia    Hyperlipidemia    Ischemic cardiomyopathy    LV (left ventricular) mural thrombus    Sleep apnea     Family History  Problem Relation Age of Onset   Hypertension Mother    Diabetes Mother     Social History:  reports that he has been smoking cigarettes. He has a 24 pack-year smoking history. He has never used smokeless tobacco. He reports that he does not currently use alcohol. He reports that he does not use drugs.  Medications Prior to Admission  Medication Sig Dispense Refill Last Dose   furosemide (LASIX) 40 MG tablet Take 1 tablet (40 mg total) by mouth daily. 30 tablet 2 11/01/2022   gabapentin (NEURONTIN) 300 MG capsule Take 1 capsule (300 mg total) by mouth 3 (three) times daily. 90 capsule 1 Past Week   nicotine (NICODERM CQ - DOSED IN MG/24 HOURS) 21 mg/24hr patch Place 1 patch (21 mg total) onto the skin daily. 28 patch 0 10/31/2022   atorvastatin (LIPITOR) 40 MG tablet Take 1 tablet (40 mg total) by mouth at bedtime. (Patient not taking: Reported on 11/02/2022) 30 tablet 2 Not Taking   carvedilol (COREG) 3.125 MG tablet Take 1 tablet (3.125 mg total) by mouth 2 (two) times daily with a meal. (Patient not taking: Reported on 11/02/2022) 60 tablet 2 Not Taking   empagliflozin (JARDIANCE) 10 MG TABS tablet Take 1 tablet (10 mg total) by mouth daily. (Patient not taking: Reported  on 11/02/2022) 30 tablet 2 Not Taking   pantoprazole (PROTONIX) 40 MG tablet Take 1 tablet (40 mg total) by mouth daily. (Patient not taking: Reported on 11/02/2022) 30 tablet 2 Not Taking   sildenafil (VIAGRA) 50 MG tablet Take 50 mg by mouth daily as needed for erectile dysfunction. (Patient not taking: Reported on 11/02/2022)   Not Taking      Exam: Current vital signs: BP 110/85 (BP Location: Left Arm)   Pulse 73   Temp (!) 97.5 F (36.4 C) (Oral)   Resp 17   Ht 5\' 9"  (1.753 m)   Wt 110.2 kg   SpO2 99%   BMI 35.88  kg/m  Vital signs in last 24 hours: Temp:  [97.4 F (36.3 C)-98.6 F (37 C)] 97.5 F (36.4 C) (11/04 0827) Pulse Rate:  [73-90] 73 (11/04 0827) Resp:  [17-20] 17 (11/04 0400) BP: (110-126)/(70-89) 110/85 (11/04 0827) SpO2:  [94 %-100 %] 99 % (11/04 0827) Weight:  [110.2 kg] 110.2 kg (11/04 0430)   Physical Exam  Constitutional: Appears well-developed and well-nourished.  Psych: Affect appropriate to situation Neuro: AOx3, no aphasia, no dysarthria, at times stuttering, cranial nerves grossly intact, antigravity strength without drift in all 4 extremities, FTN intact bilaterally, sensory intact to light touch  I have reviewed labs in epic and the results pertinent to this consultation are: CBC:  Recent Labs  Lab 11/11/22 0414 11/12/22 0448 11/13/22 0408 11/14/22 0457  WBC 12.5*   < > 14.1* 17.5*  NEUTROABS 10.8*  --   --   --   HGB 8.1*   < > 8.4* 8.6*  HCT 27.9*   < > 28.5* 28.9*  MCV 77.5*   < > 78.1* 79.2*  PLT 261   < > 267 273   < > = values in this interval not displayed.    Basic Metabolic Panel:  Lab Results  Component Value Date   NA 131 (L) 11/13/2022   K 4.5 11/13/2022   CO2 24 11/13/2022   GLUCOSE 259 (H) 11/13/2022   BUN 48 (H) 11/13/2022   CREATININE 1.83 (H) 11/13/2022   CALCIUM 7.8 (L) 11/13/2022   GFRNONAA 40 (L) 11/13/2022   GFRAA >60 05/22/2019   Lipid Panel:  Lab Results  Component Value Date   LDLCALC 91 11/10/2021   HgbA1c:  Lab Results  Component Value Date   HGBA1C 6.3 (H) 10/21/2022   Urine Drug Screen:     Component Value Date/Time   LABOPIA NONE DETECTED 11/02/2022 1220   COCAINSCRNUR POSITIVE (A) 11/02/2022 1220   LABBENZ NONE DETECTED 11/02/2022 1220   AMPHETMU NONE DETECTED 11/02/2022 1220   THCU NONE DETECTED 11/02/2022 1220   LABBARB NONE DETECTED 11/02/2022 1220    Alcohol Level     Component Value Date/Time   ETH <10 05/14/2019 1204     I have reviewed the images obtained:  MRI Brain with and without  contrast 11/06/2022: Slight interval growth of meningioma along the medial aspect of the right middle cranial fossa now measuring 3.0 x 1.9 x 2.1 cm.  Progressive edema throughout the right temporal lobe. Remote infarct of the right occipital lobe. Remote lacunar infarcts of the cerebellum bilaterally. Moderate generalized atrophy and white matter disease likely reflects the sequela of chronic microvascular ischemia.    ASSESSMENT/PLAN: 67 year old male with right temporal meningioma with cerebral edema as noted above with intermittent dysarthria and stuttering speech.  Right temporal meningioma Cerebral edema Dysarthria -Differentials include seizures versus most likely due to stress (  states it happens more often when he is stressed)  Recommendations: -Will obtain routine EEG to assess for epileptogenicity.  If negative, will not start any antiseizure medications. -Patient is already on gabapentin which does have some antiseizure properties.  Also recently started on dexamethasone -Discussed seizure provoking factors including alcohol use, drug use, lack of sleep -Counseled against cocaine use, alcohol use -Discussed sleep hygiene -Discussed plan in detail with patient as well as daughter on phone -Updated Dr. Arbutus Leas via secure chat  If patient has a seizure, call 911 and bring them to the ED if: A.  The seizure lasts longer than 5 minutes.      B.  The patient doesn't wake shortly after the seizure or has new problems such as difficulty seeing, speaking or moving following the seizure C.  The patient was injured during the seizure D.  The patient has a temperature over 102 F (39C) E.  The patient vomited during the seizure and now is having trouble breathing    During the Seizure   - First, ensure adequate ventilation and place patients on the floor on their left side  Loosen clothing around the neck and ensure the airway is patent. If the patient is clenching the teeth, do not force the  mouth open with any object as this can cause severe damage - Remove all items from the surrounding that can be hazardous. The patient may be oblivious to what's happening and may not even know what he or she is doing. If the patient is confused and wandering, either gently guide him/her away and block access to outside areas - Reassure the individual and be comforting - Call 911. In most cases, the seizure ends before EMS arrives. However, there are cases when seizures may last over 3 to 5 minutes. Or the individual may have developed breathing difficulties or severe injuries. If a pregnant patient or a person with diabetes develops a seizure, it is prudent to call an ambulance.    After the Seizure (Postictal Stage)   After a seizure, most patients experience confusion, fatigue, muscle pain and/or a headache. Thus, one should permit the individual to sleep. For the next few days, reassurance is essential. Being calm and helping reorient the person is also of importance.   Most seizures are painless and end spontaneously. Seizures are not harmful to others but can lead to complications such as stress on the lungs, brain and the heart. Individuals with prior lung problems may develop labored breathing and respiratory distress.    Thank you for allowing [place] to participate in the care of this patient. If you have any further questions, please contact  me or neurohospitalist.   Lindie Spruce Epilepsy [facility]

## 2022-11-14 NOTE — Plan of Care (Addendum)
Pt alert and oriented x 4. Up adlib in room. BM  this am. Vitals stable. Pt reported coughing up scant amount of blood this am. Dr. Thomes Dinning aware.  Problem: Education: Goal: Knowledge of disease or condition will improve Outcome: Progressing Goal: Knowledge of secondary prevention will improve (MUST DOCUMENT ALL) Outcome: Progressing Goal: Knowledge of patient specific risk factors will improve [name] N/A or DELETE if not current risk factor) Outcome: Progressing   Problem: Ischemic Stroke/TIA Tissue Perfusion: Goal: Complications of ischemic stroke/TIA will be minimized Outcome: Progressing   Problem: Coping: Goal: Will verbalize positive feelings about self Outcome: Progressing Goal: Will identify appropriate support needs Outcome: Progressing   Problem: Health Behavior/Discharge Planning: Goal: Ability to manage health-related needs will improve Outcome: Progressing Goal: Goals will be collaboratively established with patient/family Outcome: Progressing   Problem: Self-Care: Goal: Ability to participate in self-care as condition permits will improve Outcome: Progressing Goal: Verbalization of feelings and concerns over difficulty with self-care will improve Outcome: Progressing Goal: Ability to communicate needs accurately will improve Outcome: Progressing   Problem: Nutrition: Goal: Risk of aspiration will decrease Outcome: Progressing Goal: Dietary intake will improve Outcome: Progressing   Problem: Education: Goal: Knowledge of General Education information will improve Description: Including pain rating scale, medication(s)/side effects and non-pharmacologic comfort measures Outcome: Progressing   Problem: Health Behavior/Discharge Planning: Goal: Ability to manage health-related needs will improve Outcome: Progressing   Problem: Clinical Measurements: Goal: Ability to maintain clinical measurements within normal limits will improve Outcome: Progressing Goal:  Will remain free from infection Outcome: Progressing Goal: Diagnostic test results will improve Outcome: Progressing Goal: Respiratory complications will improve Outcome: Progressing Goal: Cardiovascular complication will be avoided Outcome: Progressing   Problem: Activity: Goal: Risk for activity intolerance will decrease Outcome: Progressing   Problem: Nutrition: Goal: Adequate nutrition will be maintained Outcome: Progressing   Problem: Coping: Goal: Level of anxiety will decrease Outcome: Progressing   Problem: Elimination: Goal: Will not experience complications related to bowel motility Outcome: Progressing Goal: Will not experience complications related to urinary retention Outcome: Progressing   Problem: Pain Management: Goal: General experience of comfort will improve Outcome: Progressing   Problem: Safety: Goal: Ability to remain free from injury will improve Outcome: Progressing   Problem: Skin Integrity: Goal: Risk for impaired skin integrity will decrease Outcome: Progressing   Problem: Education: Goal: Ability to describe self-care measures that may prevent or decrease complications (Diabetes Survival Skills Education) will improve Outcome: Progressing Goal: Individualized Educational Video(s) Outcome: Progressing   Problem: Coping: Goal: Ability to adjust to condition or change in health will improve Outcome: Progressing   Problem: Fluid Volume: Goal: Ability to maintain a balanced intake and output will improve Outcome: Progressing   Problem: Health Behavior/Discharge Planning: Goal: Ability to identify and utilize available resources and services will improve Outcome: Progressing Goal: Ability to manage health-related needs will improve Outcome: Progressing   Problem: Metabolic: Goal: Ability to maintain appropriate glucose levels will improve Outcome: Progressing   Problem: Nutritional: Goal: Maintenance of adequate nutrition will  improve Outcome: Progressing Goal: Progress toward achieving an optimal weight will improve Outcome: Progressing   Problem: Skin Integrity: Goal: Risk for impaired skin integrity will decrease Outcome: Progressing   Problem: Tissue Perfusion: Goal: Adequacy of tissue perfusion will improve Outcome: Progressing

## 2022-11-14 NOTE — TOC Transition Note (Signed)
Transition of Care [facility]) - CM/SW Discharge Note   Patient Details  Name: [name] MRN: [id number] Date of Birth: June 19, 1955  [facility] CM/SW Contact:  Villa Herb, LCSWA Phone Number: 11/14/2022, 12:06 PM   Clinical Narrative:    CSW updated that pt may be able to D/C home today. CSW updated Kandee Keen with [facility] of plan for D/C and that HH orders have been placed by MD. TOC spoke with pts daughter who states that she will be able to get someone to come pick pt up when medically stable, CSW to update RN to reach out to daughter once D/C is completed. TOC signing off.   Final next level of care: Home w Home Health Services Barriers to Discharge: Barriers Resolved   Patient Goals and CMS Choice CMS Medicare.gov Compare Post Acute Care list provided to:: Patient Choice offered to / list presented to : Patient  Discharge Placement                         Discharge Plan and Services Additional resources added to the After Visit Summary for   In-house Referral: Clinical Social Work Discharge Planning Services: CM Consult Post Acute Care Choice: Home Health                    HH Arranged: PT, RN [facility] Agency: [facility] Date [facility] Agency Contacted: 11/14/22   Representative spoke with at [facility] Agency: Kandee Keen  Social Determinants of Health (SDOH) Interventions SDOH Screenings   Food Insecurity: No Food Insecurity (11/02/2022)  Housing: Low Risk  (11/02/2022)  Transportation Needs: No Transportation Needs (11/02/2022)  Utilities: Not At Risk (11/02/2022)  Tobacco Use: High Risk (11/11/2022)     Readmission Risk Interventions    11/04/2022   10:22 AM 10/21/2022    1:05 PM  Readmission Risk Prevention Plan  Transportation Screening Complete Complete  HRI or Home Care Consult Complete Complete  Social Work Consult for Recovery Care Planning/Counseling Complete Complete  Palliative Care Screening Not Applicable Not Applicable  Medication  Review Oceanographer) Complete Complete

## 2022-11-15 ENCOUNTER — Other Ambulatory Visit: Payer: Self-pay | Admitting: *Deleted

## 2022-11-15 DIAGNOSIS — J9601 Acute respiratory failure with hypoxia: Secondary | ICD-10-CM | POA: Diagnosis not present

## 2022-11-15 DIAGNOSIS — F17200 Nicotine dependence, unspecified, uncomplicated: Secondary | ICD-10-CM | POA: Diagnosis not present

## 2022-11-15 DIAGNOSIS — I5023 Acute on chronic systolic (congestive) heart failure: Secondary | ICD-10-CM | POA: Diagnosis not present

## 2022-11-15 DIAGNOSIS — N1831 Chronic kidney disease, stage 3a: Secondary | ICD-10-CM | POA: Diagnosis not present

## 2022-11-15 LAB — GLUCOSE, CAPILLARY
Glucose-Capillary: 203 mg/dL — ABNORMAL HIGH (ref 70–99)
Glucose-Capillary: 204 mg/dL — ABNORMAL HIGH (ref 70–99)

## 2022-11-15 MED ORDER — INSULIN GLARGINE 100 UNIT/ML SOLOSTAR PEN: 5.0000 [IU] | PEN_INJECTOR | Freq: Every day | SUBCUTANEOUS | 0 refills | Status: DC

## 2022-11-15 MED ORDER — INSULIN PEN NEEDLE 31G X 5 MM MISC: 0 refills | Status: DC

## 2022-11-15 MED ORDER — DEXAMETHASONE 2 MG PO TABS: 2.0000 mg | ORAL_TABLET | Freq: Every day | ORAL | 0 refills | Status: DC

## 2022-11-15 NOTE — Plan of Care (Signed)
Pt alert and oriented x 4. Up adlib in room. Pt received tylenol for back pain. Was effective patient asleep upon follow up. Pt requested pain meds approx 0300. Lidocaine patch applied. Heated blank and heat pack to the nodule on upper back. Pt report pain improved after application of heat pack and blanket. BS 134 at hs. Pt ordered pizza from outside of hospital. 2 slices missing. Non compliant with diet orders. Vitals stable.  Problem: Education: Goal: Knowledge of disease or condition will improve Outcome: Progressing Goal: Knowledge of secondary prevention will improve (MUST DOCUMENT ALL) Outcome: Progressing Goal: Knowledge of patient specific risk factors will improve [name] N/A or DELETE if not current risk factor) Outcome: Progressing   Problem: Ischemic Stroke/TIA Tissue Perfusion: Goal: Complications of ischemic stroke/TIA will be minimized Outcome: Progressing   Problem: Coping: Goal: Will verbalize positive feelings about self Outcome: Progressing Goal: Will identify appropriate support needs Outcome: Progressing   Problem: Health Behavior/Discharge Planning: Goal: Ability to manage health-related needs will improve Outcome: Progressing Goal: Goals will be collaboratively established with patient/family Outcome: Progressing   Problem: Self-Care: Goal: Ability to participate in self-care as condition permits will improve Outcome: Progressing Goal: Verbalization of feelings and concerns over difficulty with self-care will improve Outcome: Progressing Goal: Ability to communicate needs accurately will improve Outcome: Progressing   Problem: Nutrition: Goal: Risk of aspiration will decrease Outcome: Progressing Goal: Dietary intake will improve Outcome: Progressing   Problem: Education: Goal: Knowledge of General Education information will improve Description: Including pain rating scale, medication(s)/side effects and non-pharmacologic comfort measures Outcome:  Progressing   Problem: Health Behavior/Discharge Planning: Goal: Ability to manage health-related needs will improve Outcome: Progressing   Problem: Clinical Measurements: Goal: Ability to maintain clinical measurements within normal limits will improve Outcome: Progressing Goal: Will remain free from infection Outcome: Progressing Goal: Diagnostic test results will improve Outcome: Progressing Goal: Respiratory complications will improve Outcome: Progressing Goal: Cardiovascular complication will be avoided Outcome: Progressing   Problem: Activity: Goal: Risk for activity intolerance will decrease Outcome: Progressing   Problem: Coping: Goal: Level of anxiety will decrease Outcome: Progressing   Problem: Elimination: Goal: Will not experience complications related to bowel motility Outcome: Progressing Goal: Will not experience complications related to urinary retention Outcome: Progressing   Problem: Pain Management: Goal: General experience of comfort will improve Outcome: Progressing   Problem: Safety: Goal: Ability to remain free from injury will improve Outcome: Progressing   Problem: Skin Integrity: Goal: Risk for impaired skin integrity will decrease Outcome: Progressing   Problem: Education: Goal: Ability to describe self-care measures that may prevent or decrease complications (Diabetes Survival Skills Education) will improve Outcome: Progressing Goal: Individualized Educational Video(s) Outcome: Progressing   Problem: Coping: Goal: Ability to adjust to condition or change in health will improve Outcome: Progressing   Problem: Fluid Volume: Goal: Ability to maintain a balanced intake and output will improve Outcome: Progressing   Problem: Health Behavior/Discharge Planning: Goal: Ability to identify and utilize available resources and services will improve Outcome: Progressing Goal: Ability to manage health-related needs will improve Outcome:  Progressing   Problem: Metabolic: Goal: Ability to maintain appropriate glucose levels will improve Outcome: Progressing   Problem: Nutritional: Goal: Maintenance of adequate nutrition will improve Outcome: Progressing Goal: Progress toward achieving an optimal weight will improve Outcome: Progressing   Problem: Skin Integrity: Goal: Risk for impaired skin integrity will decrease Outcome: Progressing   Problem: Tissue Perfusion: Goal: Adequacy of tissue perfusion will improve Outcome: Progressing

## 2022-11-15 NOTE — Consult Note (Signed)
[facility] Liaison Note  11/15/2022  [name] 1955-01-21 [id number]  Location: RN Hospital Liaison screened the patient remotely at [facility].  Insurance: [employer]   [name] is a 67 y.o. male who is a Primary Care Patient of Felecia Shelling, [name], MD The patient was screened for 30 day readmission hospitalization with noted extreme risk score for unplanned readmission risk with 2 IP/1 ED in 6 months.  The patient was assessed for potential Care Management service needs for post hospital transition for care coordination. Review of patient's electronic medical record reveals patient was admitted with CHF. Liaison with the daughter [name] (out of state) and educated on [employer] community care management services. Offered a post hospital prevention readmission follow up call (receptive). Verified pt's PCP is Dr. Felecia Shelling. Verified pt hs arrangements for [facility] with [facility] when he is discharged. States he lives around family if he needs some assistance. Will make a referral for [facility] care coordinator and TOC to follow up for care management services.  Plan: [facility] Liaison will continue to follow progress and disposition to asess for post hospital community care coordination/management needs.  Referral request for community care coordination: Make a referral for care management services.   [facility] Care Management/Population Health does not replace or interfere with any arrangements made by the Inpatient Transition of Care team.   For questions contact:   Russell Cousin, RN, [facility] Liaison [place]   [facility], Population Health Office Hours MTWF  8:00 am-6:00 pm Direct Dial: [phone] mobile [phone] [Office toll free line] Office Hours are M-F 8:30 - 5 pm [name]

## 2022-11-15 NOTE — Progress Notes (Signed)
Pt has DC order. AVS was given and explained to pt, all questions were answered. MD called the daughter, daughter is setting up transport for pt. Pending transportation.

## 2022-11-15 NOTE — Discharge Summary (Signed)
Physician Discharge Summary   Patient: [name] MRN: [id number] DOB: Aug 11, 1955  Admit date:     11/02/2022  Discharge date: 11/15/22  Discharge Physician: Onalee Hua Sagal Gayton   PCP: Benetta Spar, MD   Recommendations at discharge:   Please follow up with primary care provider within 1-2 weeks  Please repeat BMP and CBC in one week     Hospital Course:  67 y.o. male with a history of CAD, tobacco use, HTN, HLD, T2DM, GERD, history of cocaine use, OSA, and combined CHF (LVEF 20-25%, G2DD) who presented to the ED 10/23 with migratory abdominal/back/pelvic pain and dyspnea. He was admitted for recurrent acute on chronic combined CHF, [place] +cocaine. IV diuresis is underway with cardiology consultation. Developed delirium 10/25, head CT nonacute. Mentation has cleared up a bit but not at baseline. CBG shows no new hypercarbia. MR brain shows chronic meningioma with edema without midline shift.  Hospitalization has been prolonged secondary to fluid overload requiring aggressive diuresis as well as drop in hemoglobin.  Patient was transfused 2 unit PRBC total for the hospitalization .  GI was consulted assist with management.  colonoscopy--3 polyps in transverse colon, one in ascending colon;  iron deficiency due to large polyps with one with visible oozing. Slow response to diuresis in part due to patient's dietary indiscretion as he ordered outside food.  Pt ordered [name] twice during the hospital admission. Fortunately, patient continued to improve clinically.    He was seen by neurology for his stuttered.  EEG did not show any seizure.  Neurology did not recommend starting any meds.  He was transfused 2 units PRBC during the hospitalization and hgb remained stable without any further signs of blood loss.  HHPT and RN were set up.  Assessment and Plan:  Acute hypoxic respiratory failure due to acute on chronic HFrEF, pleural effusions:  - initially on 2L - oxygenation  improved with diuresis - Continue to stress incentive spirometry, ambulate the patient as atelectasis is contributing. -now stable on RA, but uses oxygen for comfort -11/02/22 CTA chest--no dissection; +pleural eff, +pulmonary edema -11/02/22 CTA abd--chronic occlusion of left common iliac with reconstitution of left fem artery;  no acute intraabdominal process   Acute metabolic encephalopathy, delirium:  -Possibly element of hospital delirium and/or cocaine withdrawal.  - CT head >> no acute findings, though chronic microvascular disease and meningioma--no midline shift - LFT's/ammonia wnl.  - 10/28 VBG 7.38/52/<31 - No urinary retention noted per RN - 10/27 MR brain--slight interval growth of meningioma with progressive edema of R-temporal lobe -10/31 UA/culture--no pyuria -B12--576, folate 7.4 -TSH 1.349 -Ammonia - <10 -Given thiamine 500 mg, 6 doses; continue thiamine 100 mg daily -Mental status partly functional/nonphysiologic -mental status back to baseline for several days prior to d/c   Speech disturbance -MRI brain negative for stroke--showed a small meningioma and chronic cerebral edema -No focal deficits -Occurs intermittently during the hospitalization -pt states it's worse when he's stressed -appreciate neurology consult>>EEG--no seizure or epilleptogenicty   CKD 3a -baseline creatinine 1.4-1.7 -monitor with diuresis -serum creatinine 1.83 at time of d/c   Acute on chronic combined HFrEF and RV failure:  - Unclear whether he was taking cardiac medications PTA, though he reports he was. Given severity of biventricular failure, cardiology consulted for advice.  -appreciate cardiology  - Monitor I/O (actually turned a positive balance when PM lasix dose held), ReDS vest >40% on 10/28  - remains clinically fluid overloaded>>continued IV lasix - Given cocaine positivity, stopped beta  blocker initially -10/21/22 Echo--EF 20-25%, G2DD, mild MR -transitioned to po  torsemide 10/31     Meningioma: Extra-axial brain mass known since 2023. Had mediastinal lymphadenopathy and pleural effusion sampled during that admission which did not show malignancy. Plan per neurosurgery was to biopsy brain mass if those were negative.  - Given persistent encephalopathy and cerebral edema suggested by CT, we checked MRI w/ and w/o contrast which shows increased size in meningioma with chronic cerebral edema. Dr. Jarvis Newcomer spoke with Dr. Maurice Small who initially saw this patient. He reviewed updated images and history. Recommendation is for outpatient follow up, though with small size and appearance so characteristic of meningioma, no intervention is anticipated. Given the confusion and cerebral edema, he was ok with short trial of decadron which we have pursued.  -continue low dose dexamethasone until he follows up with neurosurgery   Cocaine abuse: +UDS - Cessation counseling   Acute on Chronic microcytic anemia:  -Hgb 10>>7.4 though no gross bleeding is noted. BUN up, though likely due to steroids and pt is on PPI.   - iron saturation 8%, ferritin 13 -foalte 7.4, B12 576 - Increase PPI to BID, check  -FOBT positive. Only record of GI evaluation was colonoscopy 2012 showing 2 sigmoid polyps, external hemorrhoids.  - Ok to continue lovenox VTE ppx for now unless bleeding is noted. -GI consult appreciated -10/30--transfuse one unit PRBC -11/2--transfuse one unit PRBC -11/1 EGD--ertythematous mucosa in antrum, +duodenitis -11/1 colonoscopy--3 polyps in transverse colon, one in ascending colon;  iron deficiency due to large polyps with one with visible oozing -11/1--give venofer x 1 -start ferrous sulfate daily on 11/2 -No signs of active bleeding -Hgb 8.6 at time of d/c   Scrotal pain, history of Fournier's gangrene: U/S reassuring (stable epididymal cyst), ?if hydrocele from overload contributing. Also note Dx from PCP "scrotal pain, chronic, bilateral" a year ago.  - Will  discontinue SGLT2i permanently. - Supportive care, urology follow up.   OSA:  - CPAP at bedtime   Peripheral neuropathy:  - Continue gabapentin   CAD, HTN: No chest pain. Later did have atypical chest pain likely musculoskeletal with persistently negative troponin and no ischemic ECG changes.  - Continue ASA, statin, BB   Cellulitis: Localized induration in areas where he may have injured the skin. No fluctuance/indication for I&D at this time.  - Treating with keflex.  - Also has likely dermal inclusion cyst on left posterior neck that does not appear infected.  -improved--finished 10 days cephlexin   PAD: Chronic left common iliac occlusion on CTA with distal reconstution.  - Needs vascular surgery evaluation as outpatient.      T2DM:  - SSI.  -10/21/22 HbA1c was 6.3%, seeing some steroid-induced hyperglycemia  -change to resistant SSI  -add low dose semglee at time of d/c until pt is done with steroids   Tobacco use:  - Nicotine patch - Cessation counseling   Alcohol dependence -start high dose thiamine x 6 doses -may have a degree of Wernicke-Korsakoff syndrome         Consultants: GI, cards, neurology Procedures performed: EGD, colonoscopy  Disposition: Home Diet recommendation:  Carb modified diet DISCHARGE MEDICATION: Allergies as of 11/15/2022       Reactions   Clopidogrel Other (See Comments)   Drowsy, Skin irritation        Medication List     STOP taking these medications    empagliflozin 10 MG Tabs tablet Commonly known as: JARDIANCE   furosemide 40 MG tablet  Commonly known as: LASIX   sildenafil 50 MG tablet Commonly known as: VIAGRA       TAKE these medications    aspirin EC 81 MG tablet Commonly known as: Aspirin Low Dose Take 1 tablet (81 mg total) by mouth daily. Swallow whole. What changed:  when to take this reasons to take this additional instructions   atorvastatin 40 MG tablet Commonly known as: LIPITOR Take 1  tablet (40 mg total) by mouth at bedtime.   carvedilol 3.125 MG tablet Commonly known as: COREG Take 1 tablet (3.125 mg total) by mouth 2 (two) times daily with a meal.   dexamethasone 2 MG tablet Commonly known as: DECADRON Take 1 tablet (2 mg total) by mouth daily. Start taking on: November 16, 2022   ferrous sulfate 325 (65 FE) MG tablet Take 1 tablet (325 mg total) by mouth daily with breakfast.   folic acid 1 MG tablet Commonly known as: FOLVITE Take 1 tablet (1 mg total) by mouth daily.   gabapentin 300 MG capsule Commonly known as: NEURONTIN Take 1 capsule (300 mg total) by mouth 3 (three) times daily.   insulin glargine 100 UNIT/ML Solostar Pen Commonly known as: LANTUS Inject 5 Units into the skin daily.   Insulin Pen Needle 31G X 5 MM Misc Use with insulin pen to dispense insulin as directed   linaclotide 145 MCG Caps capsule Commonly known as: LINZESS Take 1 capsule (145 mcg total) by mouth daily before breakfast.   nicotine 21 mg/24hr patch Commonly known as: NICODERM CQ - dosed in mg/24 hours Place 1 patch (21 mg total) onto the skin daily.   pantoprazole 40 MG tablet Commonly known as: PROTONIX Take 1 tablet (40 mg total) by mouth daily.   torsemide 20 MG tablet Commonly known as: DEMADEX Take 1 tablet (20 mg total) by mouth daily.        Follow-up Information     Jadene Pierini, MD Follow up in 1 week(s).   Specialty: Neurosurgery Contact information: [address] [place] [address] [place] [address] [phone]         Franky Macho, MD Follow up in 1 week(s).   Specialty: Gastroenterology Contact information: [address] [place] [address] [phone]         Marjo Bicker, MD Follow up in 1 week(s).   Specialties: Cardiology, Internal Medicine Contact information: [address] [place] [address] [phone]                Discharge Exam: Ceasar Mons Weights   11/13/22 0530 2022/12/02  0430 11/15/22 0500  Weight: 108.2 kg 110.2 kg 110.6 kg   HEENT:  [place]/AT, No thrush, no icterus CV:  RRR, no rub, no S3, no S4 Lung:  CTA, no wheeze, no rhonchi Abd:  soft/+BS, NT Ext:  trace LE edema, no lymphangitis, no synovitis, no rash   Condition at discharge: stable  The results of significant diagnostics from this hospitalization (including imaging, microbiology, ancillary and laboratory) are listed below for reference.   Imaging Studies: EEG adult  Result Date: 2022-12-02 Charlsie Quest, MD     2022/12/02  4:06 PM Patient Name: BLANCHE GALLIEN MRN: [id number] Epilepsy Attending: Charlsie Quest Referring Physician/Provider: Charlsie Quest, MD Date: 12-02-2022 Duration: 23.37 mins Patient history: 67 year old male with right temporal meningioma with cerebral edema as noted above with intermittent dysarthria and stuttering speech. EEG to evaluate for seizure Level of alertness: Awake, drowsy AEDs  during EEG study: GBP Technical aspects: This EEG study was done with scalp electrodes positioned according to the 10-20 International system of electrode placement. Electrical activity was reviewed with band pass filter of 1-70Hz , sensitivity of 7 uV/mm, display speed of 50mm/sec with a 60Hz  notched filter applied as appropriate. EEG data were recorded continuously and digitally stored.  Video monitoring was available and reviewed as appropriate. Description: The posterior dominant rhythm consists of 7 Hz activity of moderate voltage (25-35 uV) seen predominantly in posterior head regions, symmetric and reactive to eye opening and eye closing. Drowsiness was characterized by attenuation of the posterior background rhythm. EEG showed continuous generalized and maximal right temporal 3 to 6 Hz theta-delta slowing. Hyperventilation and photic stimulation were not performed.   ABNORMALITY - Continuous slow, generalized and maximal right temporal IMPRESSION: This study is suggestive of cortical  dysfunction arising from right temporal region likely secondary to underlying  meningioma. Additionally there is mild to moderate diffuse encephalopathy. No seizures or epileptiform discharges were seen throughout the recording. Priyanka Annabelle Harman   US Abdomen Limited RUQ (LIVER/GB)  Result Date: 11/11/2022 CLINICAL DATA:  Abdominal pain.  Transaminitis EXAM: ULTRASOUND ABDOMEN LIMITED RIGHT UPPER QUADRANT COMPARISON:  CT 11/01/2022. FINDINGS: Gallbladder: Mildly distended gallbladder. Dependent stones. No adjacent fluid. Slightly thickened wall of up to 4 mm. No reported sonographic Murphy's sign. Common bile duct: Diameter: 3 mm Liver: No focal lesion identified. Within normal limits in parenchymal echogenicity. Portal vein is patent on color Doppler imaging with normal direction of blood flow towards the liver. Other: None. IMPRESSION: Gallbladder mildly distended with stones. Slight wall thickening. No ductal dilatation. Please correlate for other clinical presentation of acute cholecystitis and if needed confirmatory HIDA scan as clinically appropriate Electronically Signed   By: Karen Kays M.D.   On: 11/11/2022 13:56   DG CHEST PORT 1 VIEW  Result Date: 11/07/2022 CLINICAL DATA:  Acute respiratory failure, hypoxia EXAM: PORTABLE CHEST 1 VIEW COMPARISON:  11/02/2022 FINDINGS: Single frontal view of the chest demonstrates an enlarged cardiac silhouette. Mild pulmonary vascular congestion, with minimal bibasilar airspace disease left greater than right. Trace left effusions are suspected. No pneumothorax. No acute bony abnormalities. IMPRESSION: 1. Findings consistent with mild congestive heart failure. Electronically Signed   By: Sharlet Salina M.D.   On: 11/07/2022 15:42   MR BRAIN W WO CONTRAST  Result Date: 11/06/2022 CLINICAL DATA:  Meningioma follow-up. EXAM: MRI HEAD WITHOUT AND WITH CONTRAST TECHNIQUE: Multiplanar, multiecho pulse sequences of the brain and surrounding structures were  obtained without and with intravenous contrast. CONTRAST:  10mL GADAVIST GADOBUTROL 1 MMOL/ML IV SOLN COMPARISON:  CT head without contrast 11/05/2022. Head without and with contrast 11/08/2021. FINDINGS: Brain: A meningioma along the medial aspect of the right middle cranial fossa demonstrates slight interval growth now measuring 3.0 x 1.9 x 2.1 cm. The meningioma does not extend into the right cavernous sinus and is separate from the right orbital apex. Progressive edema is present throughout the right temporal lobe. A remote infarct is present in the right occipital lobe. Moderate generalized atrophy and white matter disease is present bilaterally. The ventricles are proportionate to the degree of atrophy. No significant extraaxial fluid collection is present. Postcontrast images demonstrate no other pathologic enhancement. White matter changes extend into the brainstem. Remote lacunar infarcts are present in the cerebellum bilaterally. The internal auditory canals are within normal limits. Midline structures are within normal limits. Vascular: Flow is present in the major intracranial arteries. Skull and upper cervical  spine: Degenerative changes are present in the upper cervical spine. The craniocervical junction is normal. Marrow signal is normal. Sinuses/Orbits: Bilateral mastoid effusions are present. No obstructing nasopharyngeal lesion is present. The paranasal sinuses and mastoid air cells are otherwise clear. IMPRESSION: 1. Slight interval growth of meningioma along the medial aspect of the right middle cranial fossa now measuring 3.0 x 1.9 x 2.1 cm. 2. Progressive edema throughout the right temporal lobe. 3. Remote infarct of the right occipital lobe. 4. Remote lacunar infarcts of the cerebellum bilaterally. 5. Moderate generalized atrophy and white matter disease likely reflects the sequela of chronic microvascular ischemia. 6. Bilateral mastoid effusions. No obstructing nasopharyngeal lesion is  present. Electronically Signed   By: Marin Roberts M.D.   On: 11/06/2022 19:44   CT HEAD WO CONTRAST ( )  Result Date: 11/05/2022 CLINICAL DATA:  67 year old male altered mental status. EXAM: CT HEAD WITHOUT CONTRAST TECHNIQUE: Contiguous axial images were obtained from the base of the skull through the vertex without intravenous contrast. RADIATION DOSE REDUCTION: This exam was performed according to the departmental dose-optimization program which includes automated exposure control, adjustment of the mA and/or kV according to patient size and/or use of iterative reconstruction technique. COMPARISON:  Brain MRI 11/08/2021.  Head CT 11/07/2021. FINDINGS: Brain: Chronic abnormal hypodensity in the right temporal lobe tracking into the deep white matter capsules, most resembles vasogenic edema. Chronic enhancing mass at the mesial right temporal lobe, lateral to the cavernous sinus on MRI last year, occult by plain CT. Stable underlying cerebral volume and superimposed chronic right PCA territory encephalomalacia. Stable chronic small vessel disease, patchy additional white matter and thalamic involvement. No midline shift or new intracranial mass effect. No acute cortically based infarct. No acute intracranial hemorrhage identified. Vascular: Calcified atherosclerosis at the skull base. No suspicious intracranial vascular hyperdensity. Skull: No acute osseous abnormality identified. Sinuses/Orbits: Visualized paranasal sinuses and mastoids are stable and well aerated. Other: Visualized orbits and scalp soft tissues are within normal limits. IMPRESSION: 1. Chronically abnormal right middle cranial fossa where vasogenic edema in the right temporal lobe appears related to enhancing mass lateral to the right cavernous sinus on MRI last year (occult by CT). Stability and time course favor a Meningioma with chronic cerebral edema. No midline shift or progression by noncontrast CT. 2. No acute intracranial  abnormality. Underlying chronic right PCA territory infarct. Electronically Signed   By: Odessa Fleming M.D.   On: 11/05/2022 12:36   DG Abd 1 View  Result Date: 11/03/2022 CLINICAL DATA:  Abdominal pain.  Constipation. EXAM: ABDOMEN - 1 VIEW COMPARISON:  CTA yesterday.  Abdominopelvic CT 2 days ago FINDINGS: Bilateral pleural effusions. Cardiomegaly. Moderate volume of colonic stool. No bowel obstruction. There are vascular calcifications. Excreted IV contrast in the urinary bladder. IMPRESSION: 1. Moderate colonic stool burden. No bowel obstruction. 2. Bilateral pleural effusions. Cardiomegaly. Electronically Signed   By: Narda Rutherford M.D.   On: 11/03/2022 23:40   CT Angio Chest/Abd/Pel for Dissection W and/or Wo Contrast  Result Date: 11/02/2022 CLINICAL DATA:  Chest pain.  Acute aortic syndrome EXAM: CT ANGIOGRAPHY CHEST, ABDOMEN AND PELVIS TECHNIQUE: Non-contrast CT of the chest was initially obtained. Multidetector CT imaging through the chest, abdomen and pelvis was performed using the standard protocol during bolus administration of intravenous contrast. Multiplanar reconstructed images and MIPs were obtained and reviewed to evaluate the vascular anatomy. RADIATION DOSE REDUCTION: This exam was performed according to the departmental dose-optimization program which includes automated exposure control, adjustment of the mA  and/or kV according to patient size and/or use of iterative reconstruction technique. CONTRAST:  OMNIPAQUE IOHEXOL 350 MG/ML SOLN COMPARISON:  CT chest abdomen pelvis 11/07/2021 CT abdomen 11/01/2022 FINDINGS: CTA CHEST FINDINGS Cardiovascular: Noncontrast series demonstrates no intramural hematoma within the thoracic aorta. Contrast series demonstrates no aortic dissection or aneurysm. Great vessels normal. No pericardial fluid. Mediastinum/Nodes: Enlarged mediastinal lymph nodes are not changed from CT 1 year prior. For example LEFT lower paratracheal lymph node measuring 17  mm compared to 16 mm. RIGHT lower paratracheal node measuring 16 mm compares to 20 mm. Lungs/Pleura: Bilateral pleural effusions. Moderate effusion on the RIGHT and small effusion on the LEFT. Mild diffuse ground-glass density in the lungs suggest mild pulmonary edema. Mild interstitial thickening chest mild interstitial edema. No pneumothorax. No pneumonia. Musculoskeletal: No aggressive osseous lesion. Review of the MIP images confirms the above findings. CTA ABDOMEN AND PELVIS FINDINGS VASCULAR Aorta: Normal caliber aorta without aneurysm, dissection, vasculitis or significant stenosis. Celiac: Patent without evidence of aneurysm, dissection, vasculitis or significant stenosis. SMA: Patent without evidence of aneurysm, dissection, vasculitis or significant stenosis. Renals: Both renal arteries are patent without evidence of aneurysm, dissection, vasculitis, fibromuscular dysplasia or significant stenosis. IMA: Patent without evidence of aneurysm, dissection, vasculitis or significant stenosis. 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 Reconstitution of the LEFT femoral artery. No interval change. Veins: No obvious venous abnormality within the limitations of this arterial phase study. Review of the MIP images confirms the above findings. NON-VASCULAR Lower chest: Lung bases are clear. Hepatobiliary: No focal hepatic lesion. Normal gallbladder. No biliary duct dilatation. Common bile duct is normal. Pancreas: Pancreas is normal. No ductal dilatation. No pancreatic inflammation. Spleen: Normal spleen Adrenals/urinary tract: Adrenal glands and kidneys are normal. The ureters and bladder normal. Stomach/Bowel: Stomach, small bowel, appendix, and cecum are normal. The colon and rectosigmoid colon are normal. Vascular/Lymphatic: Abdominal aorta is normal caliber. No periportal or retroperitoneal adenopathy. No pelvic adenopathy. Reproductive: Prostate unremarkable Other: No free fluid. Musculoskeletal:  No aggressive osseous lesion. Review of the MIP images confirms the above findings. IMPRESSION: CHEST: 1. No evidence of aortic dissection or aneurysm. 2. Bilateral pleural effusions and mild pulmonary edema. 3. Stable mediastinal adenopathy. PELVIS: 1. No evidence of aortic dissection or aneurysm. 2. Chronic occlusion of the LEFT common iliac artery with reconstitution of the LEFT femoral artery. 3. No acute findings in the abdomen pelvis. Electronically Signed   By: Genevive Bi M.D.   On: 11/02/2022 11:25   DG Chest Portable 1 View  Result Date: 11/02/2022 CLINICAL DATA:  Shortness of breath EXAM: PORTABLE CHEST 1 VIEW COMPARISON:  Chest radiograph dated 10/21/2022 FINDINGS: Normal lung volumes. Mild bilateral interstitial opacities. No pleural effusion or pneumothorax. Similar enlarged cardiomediastinal silhouette. No acute osseous abnormality. IMPRESSION: 1. Mild bilateral interstitial opacities, which may represent pulmonary edema. 2. Similar cardiomegaly. Electronically Signed   By: Agustin Cree M.D.   On: 11/02/2022 09:43   CT ABDOMEN PELVIS W CONTRAST  Result Date: 11/01/2022 CLINICAL DATA:  Abdominal pain and flank pain.  Stone suspected EXAM: CT ABDOMEN AND PELVIS WITH CONTRAST TECHNIQUE: Multidetector CT imaging of the abdomen and pelvis was performed using the standard protocol following bolus administration of intravenous contrast. RADIATION DOSE REDUCTION: This exam was performed according to the departmental dose-optimization program which includes automated exposure control, adjustment of the mA and/or kV according to patient size and/or use of iterative reconstruction technique. CONTRAST:  OMNIPAQUE IOHEXOL 300 MG/ML  SOLN COMPARISON:  None Available.  FINDINGS: Lower chest: Moderate pleural effusion Hepatobiliary: No focal hepatic lesion. Two tiny gallstones measuring less than 5 mm each. No gallbladder distension. No biliary duct dilatation. Common bile duct is normal. Pancreas:  Pancreas is normal. No ductal dilatation. No pancreatic inflammation. Spleen: Normal spleen Adrenals/urinary tract: Adrenal glands normal. No nephrolithiasis or ureterolithiasis. Perinephric stranding similar comparison CT. No renal obstruction on delayed imaging. Bladder normal. Stomach/Bowel: Stomach, small-bowel and cecum are normal. The appendix is not identified but there is no pericecal inflammation to suggest appendicitis. Insert Vascular/Lymphatic: Abdominal aorta is normal caliber with atherosclerotic calcification. There is no retroperitoneal or periportal lymphadenopathy. No pelvic lymphadenopathy. Reproductive: Prostate normal Other: small volume of free fluid along the LEFT and RIGHT pericolic gutter. Musculoskeletal: No aggressive osseous lesion. IMPRESSION: 1. No nephrolithiasis or ureterolithiasis. 2. Perinephric stranding similar to comparison CT. No renal obstruction on delayed imaging. 3. Moderate pleural effusion. 4. Small volume of free fluid along the LEFT and RIGHT pericolic gutter. 5. Cholelithiasis without evidence of cholecystitis. 6.  Aortic Atherosclerosis (ICD10-I70.0). Electronically Signed   By: Genevive Bi M.D.   On: 11/01/2022 16:45   US SCROTUM W/DOPPLER  Result Date: 11/01/2022 CLINICAL DATA:  Testicular pain EXAM: SCROTAL ULTRASOUND DOPPLER ULTRASOUND OF THE TESTICLES TECHNIQUE: Complete ultrasound examination of the testicles, epididymis, and other scrotal structures was performed. Color and spectral Doppler ultrasound were also utilized to evaluate blood flow to the testicles. COMPARISON:  None Available. FINDINGS: Right testicle Measurements: 3.3 x 1.6 x 2.8 cm. No mass or microlithiasis visualized. Left testicle Measurements: 3.4 x 2.2 x 2.2 cm. No mass or microlithiasis visualized. Right epididymis:  Normal in size and appearance. Left epididymis: Epididymal head cyst with internal echogenic debris measuring 1.1 x 1.0 x 1.0 cm. Hydrocele:  Small left hydrocele.  Varicocele:  None visualized. Pulsed Doppler interrogation of both testes demonstrates normal low resistance arterial and venous waveforms bilaterally. IMPRESSION: 1. Complex left epididymal head cyst measuring up to 1.1 cm. 2. Small left hydrocele. Electronically Signed   By: Allegra Lai M.D.   On: 11/01/2022 11:46   ECHOCARDIOGRAM COMPLETE  Result Date: 10/21/2022    ECHOCARDIOGRAM REPORT   Patient Name:   TILMON WISEHART Date of Exam: 10/21/2022 Medical Rec #:  [id number]        Height:       69.0 in Accession #:    [id number]       Weight:       260.0 lb Date of Birth:  May 19, 1955        BSA:          2.310 m Patient Age:    67 years         BP:           104/78 mmHg Patient Gender: M                HR:           74 bpm. [facility] Procedure: 2D Echo, Cardiac Doppler and Color Doppler Indications:    CHF- Acute Systolic I50.21  History:        Patient has prior history of Echocardiogram examinations, most                 recent 11/07/2021. CHF and Cardiomyopathy, Angina and Previous                 Myocardial Infarction, Stroke and PAD, Arrythmias:Tachycardia,  Signs/Symptoms:Chest Pain; Risk Factors:Hypertension, Sleep                 Apnea, Diabetes and Dyslipidemia. CKD, stage 3.  Sonographer:    Lucendia Herrlich RCS Referring Phys: [phone] CARLOS MADERA  Sonographer Comments: Image acquisition challenging due to uncooperative patient. IMPRESSIONS  1. Left ventricular ejection fraction, by estimation, is 20 to 25%. The left ventricle has severely decreased function. The left ventricle demonstrates global hypokinesis. Left ventricular diastolic parameters are consistent with Grade II diastolic dysfunction (pseudonormalization). Elevated left atrial pressure. There is the interventricular septum is flattened in systole and diastole, consistent with right ventricular pressure and volume overload.  2. RV not well viusalized. Grossly appears mildly enlarged with mildly  decreased function. Right ventricular systolic function was not well visualized. The right ventricular size is not well visualized. Tricuspid regurgitation signal is inadequate for assessing PA pressure.  3. Left atrial size was mildly dilated.  4. The mitral valve is abnormal. Mild mitral valve regurgitation. No evidence of mitral stenosis.  5. The tricuspid valve is abnormal.  6. The aortic valve is tricuspid. There is mild calcification of the aortic valve. There is mild thickening of the aortic valve. Aortic valve regurgitation is not visualized. No aortic stenosis is present.  7. The pulmonic valve was abnormal. FINDINGS  Left Ventricle: Left ventricular ejection fraction, by estimation, is 20 to 25%. The left ventricle has severely decreased function. The left ventricle demonstrates global hypokinesis. The left ventricular internal cavity size was normal in size. There is no left ventricular hypertrophy. The interventricular septum is flattened in systole and diastole, consistent with right ventricular pressure and volume overload. Left ventricular diastolic parameters are consistent with Grade II diastolic dysfunction  (pseudonormalization). Elevated left atrial pressure.  LV Wall Scoring: The entire apex is akinetic. Right Ventricle: RV not well viusalized. Grossly appears mildly enlarged with mildly decreased function. The right ventricular size is not well visualized. Right vetricular wall thickness was not well visualized. Right ventricular systolic function was not well visualized. Tricuspid regurgitation signal is inadequate for assessing PA pressure. Left Atrium: Left atrial size was mildly dilated. Right Atrium: Right atrial size was normal in size. Pericardium: There is no evidence of pericardial effusion. Mitral Valve: The mitral valve is abnormal. Mild mitral valve regurgitation. No evidence of mitral valve stenosis. Tricuspid Valve: The tricuspid valve is abnormal. Tricuspid valve regurgitation is  mild . No evidence of tricuspid stenosis. Aortic Valve: The aortic valve is tricuspid. There is mild calcification of the aortic valve. There is mild thickening of the aortic valve. There is mild aortic valve annular calcification. Aortic valve regurgitation is not visualized. No aortic stenosis  is present. Aortic valve mean gradient measures 3.4 mmHg. Aortic valve peak gradient measures 6.4 mmHg. Aortic valve area, by VTI measures 2.54 cm. Pulmonic Valve: The pulmonic valve was abnormal. Pulmonic valve regurgitation is mild. No evidence of pulmonic stenosis. Aorta: The aortic root and ascending aorta are structurally normal, with no evidence of dilitation. IAS/Shunts: The interatrial septum was not well visualized.  LEFT VENTRICLE PLAX 2D LVIDd:         5.50 cm   Diastology LVIDs:         4.80 cm   LV e' medial:    5.78 cm/s LV PW:         0.80 cm   LV E/e' medial:  18.5 LV IVS:        1.00 cm   LV e' lateral:   8.93  cm/s LVOT diam:     2.20 cm   LV E/e' lateral: 12.0 LV SV:         56 LV SV Index:   24 LVOT Area:     3.80 cm  RIGHT VENTRICLE RV S prime:     10.20 cm/s TAPSE (M-mode): 1.1 cm LEFT ATRIUM             Index        RIGHT ATRIUM           Index LA diam:        4.90 cm 2.12 cm/m   RA Area:     15.00 cm LA Vol (A2C):   73.6 ml 31.87 ml/m  RA Volume:   34.60 ml  14.98 ml/m LA Vol (A4C):   42.2 ml 18.27 ml/m LA Biplane Vol: 56.9 ml 24.64 ml/m  AORTIC VALVE AV Area (Vmax):    2.77 cm AV Area (Vmean):   2.26 cm AV Area (VTI):     2.54 cm AV Vmax:           126.27 cm/s AV Vmean:          86.342 cm/s AV VTI:            0.222 m AV Peak Grad:      6.4 mmHg AV Mean Grad:      3.4 mmHg LVOT Vmax:         91.95 cm/s LVOT Vmean:        51.233 cm/s LVOT VTI:          0.148 m LVOT/AV VTI ratio: 0.67  AORTA Ao Root diam: 3.20 cm Ao Asc diam:  2.90 cm MITRAL VALVE MV Area (PHT): 4.89 cm     SHUNTS MV Decel Time: 155 msec     Systemic VTI:  0.15 m MR Peak grad: 34.5 mmHg     Systemic Diam: 2.20 cm MR Vmax:       293.50 cm/s MV E velocity: 107.00 cm/s MV A velocity: 46.50 cm/s MV E/A ratio:  2.30 Dina Rich MD Electronically signed by Dina Rich MD Signature Date/Time: 10/21/2022/3:48:50 PM    Final    DG Chest Portable 1 View  Result Date: 10/21/2022 CLINICAL DATA:  Difficulty breathing.  Feet and leg swelling. EXAM: PORTABLE CHEST 1 VIEW COMPARISON:  11/12/2021. FINDINGS: Mild diffuse increased interstitial markings. Redemonstration of small right pleural effusion with associated partial obscuration of right medial hemidiaphragm and right heart border, favoring combination of right lung atelectasis and/or consolidation. Bilateral lungs are otherwise clear. Left lateral costophrenic angle is clear. Stable mildly enlarged cardio-mediastinal silhouette. No acute osseous abnormalities. The soft tissues are within normal limits. IMPRESSION: 1. Mild congestive heart failure/pulmonary edema. 2. Stable small right pleural effusion with probable associated right lung atelectasis and/or consolidation. Correlate clinically. 3. Stable mild cardiomegaly. Electronically Signed   By: Jules Schick M.D.   On: 10/21/2022 11:07    Microbiology: Results for orders placed or performed during the hospital encounter of 10/21/22  SARS CORONAVIRUS 2 ([place] 6-24 HRS) Anterior Nasal Swab     Status: None   Collection Time: 10/21/22 12:17 PM   Specimen: Anterior Nasal Swab  Result Value Ref Range Status   SARS Coronavirus 2 NEGATIVE NEGATIVE Final    Comment: (NOTE) SARS-CoV-2 target nucleic acids are NOT DETECTED.  The SARS-CoV-2 RNA is generally detectable in upper and lower respiratory specimens during the acute phase of infection. Negative results do not preclude SARS-CoV-2 infection, do  not rule out co-infections with other pathogens, and should not be used as the sole basis for treatment or other patient management decisions. Negative results must be combined with clinical observations, patient history, and  epidemiological information. The expected result is Negative.  Fact Sheet for Patients: HairSlick.no  Fact Sheet for Healthcare Providers: quierodirigir.com  This test is not yet approved or cleared by the [place] FDA and  has been authorized for detection and/or diagnosis of SARS-CoV-2 by FDA under an Emergency Use Authorization (EUA). This EUA will remain  in effect (meaning this test can be used) for the duration of the COVID-19 declaration under Se ction 564(b)(1) of the Act, 21 U.S.C. section 360bbb-3(b)(1), unless the authorization is terminated or revoked sooner.  Performed at [facility], [address], [place], [place] [address]   Culture, blood (routine x 2) Call MD if unable to obtain prior to antibiotics being given     Status: None   Collection Time: 10/21/22 12:52 PM   Specimen: BLOOD  Result Value Ref Range Status   Specimen Description BLOOD RIGHT ASSIST CONTROL  Final   Special Requests   Final    BOTTLES DRAWN AEROBIC AND ANAEROBIC Blood Culture results may not be optimal due to an excessive volume of blood received in culture bottles   Culture   Final    NO GROWTH 5 DAYS Performed at [facility], [address], [place], [place] [address]    Report Status 10/26/2022 FINAL  Final  Culture, blood (routine x 2) Call MD if unable to obtain prior to antibiotics being given     Status: None   Collection Time: 10/21/22 12:52 PM   Specimen: BLOOD  Result Value Ref Range Status   Specimen Description BLOOD LEFT HAND  Final   Special Requests   Final    BOTTLES DRAWN AEROBIC AND ANAEROBIC Blood Culture results may not be optimal due to an excessive volume of blood received in culture bottles   Culture   Final    NO GROWTH 5 DAYS Performed at [facility], [address] [place], [place] [address]    Report Status 10/26/2022 FINAL  Final    Labs: CBC: Recent Labs  Lab 11/10/22 0448  11/11/22 0414 11/12/22 0448 11/12/22 1634 11/13/22 0408 11/14/22 0457  WBC 16.0* 12.5* 13.9*  --  14.1* 17.5*  NEUTROABS  --  10.8*  --   --   --   --   HGB 9.0* 8.1* 7.7* 9.2* 8.4* 8.6*  HCT 30.4* 27.9* 25.7* 30.5* 28.5* 28.9*  MCV 77.6* 77.5* 77.4*  --  78.1* 79.2*  PLT 304 261 249  --  267 273   Basic Metabolic Panel: Recent Labs  Lab 11/09/22 0503 11/10/22 0448 11/11/22 0414 11/12/22 0448 11/13/22 0408  NA 133* 132* 133* 130* 131*  K 4.4 4.3 4.4 4.3 4.5  CL 98 95* 98 98 99  CO2 27 26 28 23 24   GLUCOSE 201* 266* 119* 361* 259*  BUN 43* 52* 49* 55* 48*  CREATININE 1.61* 1.69* 1.60* 2.00* 1.83*  CALCIUM 8.5* 8.6* 8.4* 8.1* 7.8*  MG  --  2.3  --   --   --    Liver Function Tests: Recent Labs  Lab 11/11/22 0414  AST 67*  ALT 52*  ALKPHOS 102  BILITOT 0.8  PROT 7.3  ALBUMIN 2.7*   CBG: Recent Labs  Lab 11/14/22 0757 11/14/22 1116 11/14/22 1632 11/14/22 2214 11/15/22 0724  GLUCAP 196* 301* 235*  134* 203*    Discharge time spent: greater than 30 minutes.  Signed: Catarina Hartshorn, MD [facility] 11/15/2022

## 2022-11-17 ENCOUNTER — Telehealth: Payer: Self-pay | Admitting: *Deleted

## 2022-11-17 NOTE — Progress Notes (Signed)
  Care Coordination  Outreach Note  11/17/2022 Name: KELTON BULTMAN MRN: [id number] DOB: 1955-09-26   Care Coordination Outreach Attempts: An unsuccessful telephone outreach was attempted today to offer the patient information about available care coordination services.  Follow Up Plan:  Additional outreach attempts will be made to offer the patient care coordination information and services.   Encounter Outcome:  No Answer  Gwenevere Ghazi  Care Coordination Care Guide  Direct Dial: [phone]

## 2022-11-18 ENCOUNTER — Encounter (HOSPITAL_COMMUNITY): Payer: Self-pay | Admitting: Emergency Medicine

## 2022-11-18 ENCOUNTER — Encounter (INDEPENDENT_AMBULATORY_CARE_PROVIDER_SITE_OTHER): Payer: Self-pay | Admitting: *Deleted

## 2022-11-18 ENCOUNTER — Emergency Department (HOSPITAL_COMMUNITY)
Admission: EM | Admit: 2022-11-18 | Discharge: 2022-11-18 | Disposition: A | Discharge status: Home / Self Care | Payer: 59 | Attending: Emergency Medicine | Admitting: Emergency Medicine

## 2022-11-18 ENCOUNTER — Other Ambulatory Visit: Payer: Self-pay

## 2022-11-18 DIAGNOSIS — N50812 Left testicular pain: Secondary | ICD-10-CM

## 2022-11-18 DIAGNOSIS — Z794 Long term (current) use of insulin: Secondary | ICD-10-CM | POA: Insufficient documentation

## 2022-11-18 DIAGNOSIS — E119 Type 2 diabetes mellitus without complications: Secondary | ICD-10-CM | POA: Diagnosis not present

## 2022-11-18 DIAGNOSIS — N50819 Testicular pain, unspecified: Secondary | ICD-10-CM | POA: Diagnosis present

## 2022-11-18 DIAGNOSIS — M549 Dorsalgia, unspecified: Secondary | ICD-10-CM | POA: Diagnosis not present

## 2022-11-18 DIAGNOSIS — R739 Hyperglycemia, unspecified: Secondary | ICD-10-CM | POA: Diagnosis not present

## 2022-11-18 DIAGNOSIS — Z7982 Long term (current) use of aspirin: Secondary | ICD-10-CM | POA: Diagnosis not present

## 2022-11-18 DIAGNOSIS — I509 Heart failure, unspecified: Secondary | ICD-10-CM | POA: Insufficient documentation

## 2022-11-18 MED ORDER — HYDROCODONE-ACETAMINOPHEN 5-325 MG PO TABS
2.0000 | ORAL_TABLET | Freq: Once | ORAL | Status: AC
  Administered 2022-11-18: 2 via ORAL
  Filled 2022-11-18: qty 2

## 2022-11-18 MED ORDER — TRAMADOL HCL 50 MG PO TABS: 50.0000 mg | ORAL_TABLET | Freq: Four times a day (QID) | ORAL | 0 refills | Status: DC | PRN

## 2022-11-18 NOTE — ED Triage Notes (Addendum)
Pt to ed via [facility]. C/o testicular pain with some swelling for roughly a month. Pt states has experienced this before. Pt has a hx of CHF. Pt also c/o back pain. Pt ambulatory at this time. Pt had endoscopy clips placed in colon 11/11/2022

## 2022-11-18 NOTE — Discharge Instructions (Addendum)
Begin taking tramadol as prescribed.  Follow-up with your primary doctor if symptoms or not improving in the next few days.

## 2022-11-18 NOTE — ED Provider Notes (Signed)
[place] EMERGENCY DEPARTMENT AT [facility] Provider Note   CSN: [id number] Arrival date & time: 11/18/22  0259     History  Chief Complaint  Patient presents with   Testicle Pain    [name] is a 67 y.o. male.  Patient is a 67 year old male with history of congestive heart failure, diabetes, and chronic testicle pain.  Patient by EMS for evaluation of testicle pain.  This apparently became worse this evening.  He tells me he has nothing to take at home for this.  Patient was seen here approximately 1 week ago with similar complaints.  He underwent ultrasound, CT scan, all of which were unremarkable.  Patient has a history of ER visits for the same problem dating back over a year.  He denies any difficulty urinating.  The history is provided by the patient.       Home Medications Prior to Admission medications   Medication Sig Start Date End Date Taking? Authorizing Provider  aspirin EC (ASPIRIN LOW DOSE) 81 MG tablet Take 1 tablet (81 mg total) by mouth daily. Swallow whole. 11/14/22   Catarina Hartshorn, MD  atorvastatin (LIPITOR) 40 MG tablet Take 1 tablet (40 mg total) by mouth at bedtime. Patient not taking: Reported on 11/02/2022 10/25/22 11/24/22  Vassie Loll, MD  carvedilol (COREG) 3.125 MG tablet Take 1 tablet (3.125 mg total) by mouth 2 (two) times daily with a meal. Patient not taking: Reported on 11/02/2022 10/25/22 11/24/22  Vassie Loll, MD  dexamethasone (DECADRON) 2 MG tablet Take 1 tablet (2 mg total) by mouth daily. 11/16/22   Catarina Hartshorn, MD  ferrous sulfate 325 (65 FE) MG tablet Take 1 tablet (325 mg total) by mouth daily with breakfast. 11/14/22   Tat, Onalee Hua, MD  folic acid (FOLVITE) 1 MG tablet Take 1 tablet (1 mg total) by mouth daily. 11/14/22   Catarina Hartshorn, MD  gabapentin (NEURONTIN) 300 MG capsule Take 1 capsule (300 mg total) by mouth 3 (three) times daily. 10/25/22   Vassie Loll, MD  insulin glargine (LANTUS) 100 UNIT/ML Solostar Pen  Inject 5 Units into the skin daily. 11/15/22   Catarina Hartshorn, MD  Insulin Pen Needle 31G X 5 MM MISC Use with insulin pen to dispense insulin as directed 11/15/22   Catarina Hartshorn, MD  linaclotide [facility]) 145 MCG CAPS capsule Take 1 capsule (145 mcg total) by mouth daily before breakfast. 11/14/22   Tat, Onalee Hua, MD  nicotine (NICODERM CQ - DOSED IN MG/24 HOURS) 21 mg/24hr patch Place 1 patch (21 mg total) onto the skin daily. 10/26/22   Vassie Loll, MD  pantoprazole (PROTONIX) 40 MG tablet Take 1 tablet (40 mg total) by mouth daily. Patient not taking: Reported on 11/02/2022 10/26/22   Vassie Loll, MD  torsemide (DEMADEX) 20 MG tablet Take 1 tablet (20 mg total) by mouth daily. 11/13/22   Catarina Hartshorn, MD      Allergies    Clopidogrel    Review of Systems   Review of Systems  All other systems reviewed and are negative.   Physical Exam Updated Vital Signs BP 116/74 (BP Location: Right Arm)   Pulse 75   Temp 97.8 F (36.6 C) (Oral)   Resp 18   Ht 5\' 9"  (1.753 m)   Wt 110.6 kg   SpO2 95%   BMI 36.01 kg/m  Physical Exam Vitals and nursing note reviewed.  Constitutional:      General: He is not in acute distress.  Appearance: He is well-developed. He is not diaphoretic.  HENT:     Head: Normocephalic and atraumatic.  Cardiovascular:     Rate and Rhythm: Normal rate and regular rhythm.     Heart sounds: No murmur heard.    No friction rub.  Pulmonary:     Effort: Pulmonary effort is normal. No respiratory distress.     Breath sounds: Normal breath sounds. No wheezing or rales.  Abdominal:     General: Bowel sounds are normal. There is no distension.     Palpations: Abdomen is soft.     Tenderness: There is no abdominal tenderness.  Genitourinary:    Penis: Normal.      Testes: Normal.     Comments: Testicles are freely mobile within the scrotum.  Lie is normal.  There is no swelling of the scrotum, erythema, or crepitus. Musculoskeletal:        General: Normal range of  motion.     Cervical back: Normal range of motion and neck supple.  Skin:    General: Skin is warm and dry.  Neurological:     Mental Status: He is alert and oriented to person, place, and time.     Coordination: Coordination normal.     ED Results / Procedures / Treatments   Labs (all labs ordered are listed, but only abnormal results are displayed) Labs Reviewed - No data to display  EKG None  Radiology No results found.  Procedures Procedures    Medications Ordered in ED Medications  HYDROcodone-acetaminophen (NORCO/VICODIN) 5-325 MG per tablet 2 tablet (has no administration in time range)    ED Course/ Medical Decision Making/ A&P  Patient presenting with complaints of pain in his testicles.  This appears to be a chronic problem for him and just have workup 1 week ago with similar complaints.  His ultrasound and CT at that time were unremarkable.  Patient's exam today reveals no evidence for infection or other acute abnormality.  Testicles appear freely mobile within the scrotum and I have very low suspicion for torsion.  Patient's pain will be treated and I will have him follow-up with urology.  I do not feel as though repeat imaging is indicated given the clinical situation.  Final Clinical Impression(s) / ED Diagnoses Final diagnoses:  None    Rx / DC Orders ED Discharge Orders     None         Geoffery Lyons, MD 11/18/22 [phone]

## 2022-11-18 NOTE — ED Notes (Signed)
Daughter updated to pt's disposition

## 2022-11-22 NOTE — Progress Notes (Unsigned)
  Care Coordination  Outreach Note  11/22/2022 Name: [name] MRN: [id number] DOB: [birth year]   Care Coordination Outreach Attempts: A second unsuccessful outreach was attempted today to offer the patient with information about available care coordination services.  Follow Up Plan:  Additional outreach attempts will be made to offer the patient care coordination information and services.   Encounter Outcome:  No Answer Gwenevere Ghazi  Care Coordination Care Guide  Direct Dial: [phone]

## 2022-11-22 NOTE — Telephone Encounter (Signed)
LMOM for pt to call office  

## 2022-11-24 NOTE — Progress Notes (Signed)
  Care Coordination   Note   11/24/2022 Name: RAISTLIN GEBREMARIAM MRN: [id number] DOB: 09/12/1955  NEYLAN LANDFAIR is a 67 y.o. year old male who sees [name], [name], MD for primary care. I reached out to [name] by phone today to offer care coordination services.  Mr. Sawaya was given information about Care Coordination services today including:   The Care Coordination services include support from the care team which includes your Nurse Coordinator, Clinical Social Worker, or Pharmacist.  The Care Coordination team is here to help remove barriers to the health concerns and goals most important to you. Care Coordination services are voluntary, and the patient may decline or stop services at any time by request to their care team member.   Care Coordination Consent Status: Patient agreed to services and verbal consent obtained.   Follow up plan:  Telephone appointment with care coordination team member scheduled for:  11/28/22  Encounter Outcome:  Patient Scheduled  [facility] Coordination Care Guide  Direct Dial: [phone]

## 2022-11-28 ENCOUNTER — Ambulatory Visit: Payer: Self-pay | Admitting: *Deleted

## 2022-11-28 ENCOUNTER — Emergency Department (HOSPITAL_COMMUNITY): Payer: No Typology Code available for payment source

## 2022-11-28 ENCOUNTER — Encounter (HOSPITAL_COMMUNITY): Payer: Self-pay | Admitting: Emergency Medicine

## 2022-11-28 ENCOUNTER — Other Ambulatory Visit: Payer: Self-pay

## 2022-11-28 ENCOUNTER — Emergency Department (HOSPITAL_COMMUNITY)
Admission: EM | Admit: 2022-11-28 | Discharge: 2022-11-28 | Disposition: A | Discharge status: Home / Self Care | Payer: No Typology Code available for payment source | Attending: Emergency Medicine | Admitting: Emergency Medicine

## 2022-11-28 DIAGNOSIS — E119 Type 2 diabetes mellitus without complications: Secondary | ICD-10-CM | POA: Insufficient documentation

## 2022-11-28 DIAGNOSIS — I5022 Chronic systolic (congestive) heart failure: Secondary | ICD-10-CM | POA: Diagnosis not present

## 2022-11-28 DIAGNOSIS — Z7982 Long term (current) use of aspirin: Secondary | ICD-10-CM | POA: Diagnosis not present

## 2022-11-28 DIAGNOSIS — Z139 Encounter for screening, unspecified: Secondary | ICD-10-CM

## 2022-11-28 DIAGNOSIS — D72829 Elevated white blood cell count, unspecified: Secondary | ICD-10-CM | POA: Insufficient documentation

## 2022-11-28 DIAGNOSIS — Z794 Long term (current) use of insulin: Secondary | ICD-10-CM | POA: Insufficient documentation

## 2022-11-28 LAB — RAPID URINE DRUG SCREEN, HOSP PERFORMED
Amphetamines: NOT DETECTED
Barbiturates: NOT DETECTED
Benzodiazepines: NOT DETECTED
Cocaine: NOT DETECTED
Opiates: NOT DETECTED
Tetrahydrocannabinol: NOT DETECTED

## 2022-11-28 LAB — CBC WITH DIFFERENTIAL/PLATELET
Abs Immature Granulocytes: 0.11 10*3/uL — ABNORMAL HIGH (ref 0.00–0.07)
Basophils Absolute: 0 10*3/uL (ref 0.0–0.1)
Basophils Relative: 0 %
Eosinophils Absolute: 0.3 10*3/uL (ref 0.0–0.5)
Eosinophils Relative: 2 %
HCT: 34.5 % — ABNORMAL LOW (ref 39.0–52.0)
Hemoglobin: 9.6 g/dL — ABNORMAL LOW (ref 13.0–17.0)
Immature Granulocytes: 1 %
Lymphocytes Relative: 6 %
Lymphs Abs: 1.1 10*3/uL (ref 0.7–4.0)
MCH: 22 pg — ABNORMAL LOW (ref 26.0–34.0)
MCHC: 27.8 g/dL — ABNORMAL LOW (ref 30.0–36.0)
MCV: 78.9 fL — ABNORMAL LOW (ref 80.0–100.0)
Monocytes Absolute: 0.7 10*3/uL (ref 0.1–1.0)
Monocytes Relative: 4 %
Neutro Abs: 15 10*3/uL — ABNORMAL HIGH (ref 1.7–7.7)
Neutrophils Relative %: 87 %
Platelets: 268 10*3/uL (ref 150–400)
RBC: 4.37 MIL/uL (ref 4.22–5.81)
RDW: 22.5 % — ABNORMAL HIGH (ref 11.5–15.5)
WBC: 17.2 10*3/uL — ABNORMAL HIGH (ref 4.0–10.5)
nRBC: 0.2 % (ref 0.0–0.2)

## 2022-11-28 LAB — BASIC METABOLIC PANEL
Anion gap: 10 (ref 5–15)
BUN: 62 mg/dL — ABNORMAL HIGH (ref 8–23)
CO2: 25 mmol/L (ref 22–32)
Calcium: 8.5 mg/dL — ABNORMAL LOW (ref 8.9–10.3)
Chloride: 97 mmol/L — ABNORMAL LOW (ref 98–111)
Creatinine, Ser: 1.76 mg/dL — ABNORMAL HIGH (ref 0.61–1.24)
GFR, Estimated: 42 mL/min — ABNORMAL LOW (ref >60)
Glucose, Bld: 365 mg/dL — ABNORMAL HIGH (ref 70–99)
Potassium: 4.5 mmol/L (ref 3.5–5.1)
Sodium: 132 mmol/L — ABNORMAL LOW (ref 135–145)

## 2022-11-28 LAB — URINALYSIS, ROUTINE W REFLEX MICROSCOPIC
Bacteria, UA: NONE SEEN
Bilirubin Urine: NEGATIVE
Glucose, UA: >=500 mg/dL — AB
Hgb urine dipstick: NEGATIVE
Ketones, ur: NEGATIVE mg/dL
Leukocytes,Ua: NEGATIVE
Nitrite: NEGATIVE
Protein, ur: 30 mg/dL — AB
Specific Gravity, Urine: 1.013 (ref 1.005–1.030)
pH: 5 (ref 5.0–8.0)

## 2022-11-28 LAB — LACTIC ACID, PLASMA: Lactic Acid, Venous: 1.5 mmol/L (ref 0.5–1.9)

## 2022-11-28 LAB — ETHANOL: Alcohol, Ethyl (B): <10 mg/dL (ref <10)

## 2022-11-28 LAB — BRAIN NATRIURETIC PEPTIDE: B Natriuretic Peptide: 304 pg/mL — ABNORMAL HIGH (ref 0.0–100.0)

## 2022-11-28 LAB — AMMONIA: Ammonia: 18 umol/L (ref 9–35)

## 2022-11-28 NOTE — ED Triage Notes (Signed)
Pt reports he was sent by PCP for admission related to fluid retention

## 2022-11-28 NOTE — Patient Outreach (Signed)
  Care Coordination   11/28/2022 Name: TAELON DELROSARIO MRN: [id number] DOB: 1955-11-24   Care Coordination Outreach Attempts:  An unsuccessful telephone outreach was attempted for a scheduled appointment today.  Follow Up Plan:  Additional outreach attempts will be made to offer the patient care coordination information and services.   Encounter Outcome:  No Answer   Care Coordination Interventions:  No, not indicated. Staff message sent to care guide requesting outreach and rescheduling.    Demetrios Loll, RN, BSN Care Management Coordinator [facility]  [facility] Direct Dial: [phone] Main #: [phone]

## 2022-11-28 NOTE — Discharge Instructions (Addendum)
Your blood work is reassuring today there is no evidence of any kind of abnormality.  Your blood sugar is slightly elevated, but you are not in DKA, make sure you are taking your blood sugars at home, and following up with your PCP.  Continue taking your legs as well. Return to the ER if you feel like your symptoms are worsening

## 2022-11-28 NOTE — ED Provider Triage Note (Cosign Needed Addendum)
Emergency Medicine Provider Triage Evaluation Note  [name] , a 67 y.o. male  was evaluated in triage.  Pt in the ER.  He states he was seen at an outpatient office today and told to come to the ER to be admitted for fluid overload, complaining of some swelling in his feet bilaterally and intermittent shortness of breath for the past several days.  History of cocaine abuse, CKD, GERD, CAD, ischemic cardiomyopathy.  States he has not had alcohol in about 2 to 3 weeks  Review of Systems  Positive: Intermittent sob, LE swelling Negative: Chest pain, fever  Physical Exam  BP (!) 96/59   Pulse (!) 103   Temp 99 F (37.2 C) (Oral)   Resp 17   Ht 5\' 9"  (1.753 m)   Wt 110.2 kg   SpO2 99%   BMI 35.88 kg/m  Gen:   Awake, no distress   Resp:  Normal effort  MSK:   Moves extremities without difficulty  Other:    Medical Decision Making  Medically screening exam initiated at 4:14 PM.  Appropriate orders placed.  CHRISTOVAL HECKMANN was informed that the remainder of the evaluation will be completed by another provider, this initial triage assessment does not replace that evaluation, and the importance of remaining in the ED until their evaluation is complete.   Denies abnormal vitals, contacted charge nurse to let her know patient will need next available room Ma Rings, PA-C 11/28/22 1619    Ma Rings, PA-C 11/28/22 1619

## 2022-11-28 NOTE — ED Provider Notes (Signed)
[place] EMERGENCY DEPARTMENT AT [facility] Provider Note   CSN: [id number] Arrival date & time: 11/28/22  1230     History  No chief complaint on file.   [name] is a 67 y.o. male, CHF, DMII, who presents to the ED secondary to being sent by his doctor.  He is not sure exactly why the doctor sent him, spoke with daughter, Ms. Ayotte, who states that patient is therefore high blood sugar, and low blood pressure.  Daughter does not live with patient, but states that patient has all been a little bit more slow on the phone. Patient denies any chest pain, shortness of breath, abdominal pain, nausea, vomiting.    Home Medications Prior to Admission medications   Medication Sig Start Date End Date Taking? Authorizing Provider  aspirin EC (ASPIRIN LOW DOSE) 81 MG tablet Take 1 tablet (81 mg total) by mouth daily. Swallow whole. 11/14/22   Catarina Hartshorn, MD  atorvastatin (LIPITOR) 40 MG tablet Take 1 tablet (40 mg total) by mouth at bedtime. Patient not taking: Reported on 11/02/2022 10/25/22 11/24/22  Vassie Loll, MD  carvedilol (COREG) 3.125 MG tablet Take 1 tablet (3.125 mg total) by mouth 2 (two) times daily with a meal. Patient not taking: Reported on 11/02/2022 10/25/22 11/24/22  Vassie Loll, MD  dexamethasone (DECADRON) 2 MG tablet Take 1 tablet (2 mg total) by mouth daily. 11/16/22   Catarina Hartshorn, MD  ferrous sulfate 325 (65 FE) MG tablet Take 1 tablet (325 mg total) by mouth daily with breakfast. 11/14/22   Tat, Onalee Hua, MD  folic acid (FOLVITE) 1 MG tablet Take 1 tablet (1 mg total) by mouth daily. 11/14/22   Catarina Hartshorn, MD  gabapentin (NEURONTIN) 300 MG capsule Take 1 capsule (300 mg total) by mouth 3 (three) times daily. 10/25/22   Vassie Loll, MD  insulin glargine (LANTUS) 100 UNIT/ML Solostar Pen Inject 5 Units into the skin daily. 11/15/22   Catarina Hartshorn, MD  Insulin Pen Needle 31G X 5 MM MISC Use with insulin pen to dispense insulin as directed 11/15/22   Catarina Hartshorn, MD  linaclotide [facility]) 145 MCG CAPS capsule Take 1 capsule (145 mcg total) by mouth daily before breakfast. 11/14/22   Tat, Onalee Hua, MD  nicotine (NICODERM CQ - DOSED IN MG/24 HOURS) 21 mg/24hr patch Place 1 patch (21 mg total) onto the skin daily. 10/26/22   Vassie Loll, MD  pantoprazole (PROTONIX) 40 MG tablet Take 1 tablet (40 mg total) by mouth daily. Patient not taking: Reported on 11/02/2022 10/26/22   Vassie Loll, MD  torsemide (DEMADEX) 20 MG tablet Take 1 tablet (20 mg total) by mouth daily. 11/13/22   Catarina Hartshorn, MD  traMADol (ULTRAM) 50 MG tablet Take 1 tablet (50 mg total) by mouth every 6 (six) hours as needed. 11/18/22   Geoffery Lyons, MD      Allergies    Clopidogrel    Review of Systems   Review of Systems  Constitutional:  Negative for fever.  Respiratory:  Negative for shortness of breath.   Cardiovascular:  Negative for chest pain.    Physical Exam Updated Vital Signs BP 126/77   Pulse 90   Temp 99 F (37.2 C) (Oral)   Resp 17   Ht 5\' 9"  (1.753 m)   Wt 110.2 kg   SpO2 97%   BMI 35.88 kg/m  Physical Exam Vitals and nursing note reviewed.  Constitutional:      General: He is not in  acute distress.    Appearance: He is well-developed.  HENT:     Head: Normocephalic and atraumatic.  Eyes:     Conjunctiva/sclera: Conjunctivae normal.  Cardiovascular:     Rate and Rhythm: Normal rate and regular rhythm.     Heart sounds: No murmur heard. Pulmonary:     Effort: Pulmonary effort is normal. No respiratory distress.     Breath sounds: Normal breath sounds.  Abdominal:     Palpations: Abdomen is soft.     Tenderness: There is no abdominal tenderness.  Musculoskeletal:        General: No swelling.     Cervical back: Neck supple.     Right lower leg: No edema.     Left lower leg: No edema.  Skin:    General: Skin is warm and dry.     Capillary Refill: Capillary refill takes less than 2 seconds.  Neurological:     Mental Status: He is alert.   Psychiatric:        Mood and Affect: Mood normal.     ED Results / Procedures / Treatments   Labs (all labs ordered are listed, but only abnormal results are displayed) Labs Reviewed  BASIC METABOLIC PANEL - Abnormal; Notable for the following components:      Result Value   Sodium 132 (*)    Chloride 97 (*)    Glucose, Bld 365 (*)    BUN 62 (*)    Creatinine, Ser 1.76 (*)    Calcium 8.5 (*)    GFR, Estimated 42 (*)    All other components within normal limits  BRAIN NATRIURETIC PEPTIDE - Abnormal; Notable for the following components:   B Natriuretic Peptide 304.0 (*)    All other components within normal limits  CBC WITH DIFFERENTIAL/PLATELET - Abnormal; Notable for the following components:   WBC 17.2 (*)    Hemoglobin 9.6 (*)    HCT 34.5 (*)    MCV 78.9 (*)    MCH 22.0 (*)    MCHC 27.8 (*)    RDW 22.5 (*)    Neutro Abs 15.0 (*)    Abs Immature Granulocytes 0.11 (*)    All other components within normal limits  URINALYSIS, ROUTINE W REFLEX MICROSCOPIC - Abnormal; Notable for the following components:   Glucose, UA >=500 (*)    Protein, ur 30 (*)    All other components within normal limits  CULTURE, BLOOD (ROUTINE X 2)  CULTURE, BLOOD (ROUTINE X 2)  ETHANOL  RAPID URINE DRUG SCREEN, HOSP PERFORMED  AMMONIA  LACTIC ACID, PLASMA    EKG EKG Interpretation Date/Time:  Monday November 28 2022 12:54:32 EST Ventricular Rate:  104 PR Interval:  206 QRS Duration:  80 QT Interval:  328 QTC Calculation: 431 R Axis:   -84  Text Interpretation: Sinus tachycardia Left axis deviation Inferior infarct (cited on or before 02-Nov-2022) Anterolateral infarct (cited on or before 02-Nov-2022) Abnormal ECG When compared with ECG of 02-Nov-2022 17:31, Premature atrial complexes are no longer Present Questionable change in initial forces of Inferior leads Nonspecific T wave abnormality, improved in Lateral leads QT has shortened Confirmed by Linwood Dibbles [phone]) on 11/28/2022  4:07:46 PM  Radiology CT Head Wo Contrast  Result Date: 11/28/2022 CLINICAL DATA:  Mental status change, alcohol/drug use EXAM: CT HEAD WITHOUT CONTRAST TECHNIQUE: Contiguous axial images were obtained from the base of the skull through the vertex without intravenous contrast. RADIATION DOSE REDUCTION: This exam was performed according to the departmental  dose-optimization program which includes automated exposure control, adjustment of the mA and/or kV according to patient size and/or use of iterative reconstruction technique. COMPARISON:  Head CT 06/30/2022, brain MR 11/06/22 FINDINGS: Brain: Persistent nonspecific edema in the right temporal lobe. Known meningioma in the right middle cranial fossa is not visualized on this exam due to CT technique. No hemorrhage. No hydrocephalus. No extra-axial fluid collection. No CT evidence of cortical infarct. Unchanged chronic infarcts in the right occipital lobe and left thalamus Vascular: No hyperdense vessel or unexpected calcification. Skull: Normal. Negative for fracture or focal lesion. Sinuses/Orbits: No middle ear or mastoid effusion. Paranasal sinuses are clear. Orbits are unremarkable. Other: None IMPRESSION: 1. Persistent nonspecific edema in the right temporal lobe. No hemorrhage. 2. Known meningioma in the right middle cranial fossa is not visualized on this exam due to CT technique. 3. Unchanged chronic infarcts in the right occipital lobe and left thalamus. Electronically Signed   By: Lorenza Cambridge M.D.   On: 11/28/2022 19:53   DG Chest 2 View  Result Date: 11/28/2022 CLINICAL DATA:  Shortness of breath. EXAM: CHEST - 2 VIEW COMPARISON:  Chest radiograph dated October 18, 2022. FINDINGS: The heart size is enlarged. The mediastinal contours are within normal limits. Bilateral interstitial prominence, most pronounced at the lung bases. No focal consolidation. No pleural effusion or pneumothorax. No acute osseous abnormality. IMPRESSION: Cardiomegaly  with findings suggestive of pulmonary vascular congestion. Electronically Signed   By: Hart Robinsons M.D.   On: 11/28/2022 19:22    Procedures Procedures    Medications Ordered in ED Medications - No data to display  ED Course/ Medical Decision Making/ A&P                                 Medical Decision Making Patient is a 67 year old male, he sent in for high glucose, low blood pressure, per PCP.  Patient states he has been feeling fine, but is poor historian.  Cannot tell me what his medications are, or if he takes them other than that he takes them when his friend tells him to.  He is overall well-appearing, just poor historian but is alert and oriented x 4.  We will obtain a CT head, EKG, blood work, including blood cultures, BNP, urinalysis, ethanol, drug screen, ammonia and lactic acid for further evaluation.  Amount and/or Complexity of Data Reviewed Labs: ordered.    Details: Blood work reassuring, leukocytosis of 17.2K, similar to past.  Mildly elevated BNP of 304, creatinine similar to past Radiology: ordered.    Details: Chest x-ray shows mild cardiomegaly, and slight vascular congestion ECG/medicine tests:  Decision-making details documented in ED Course. Discussion of management or test interpretation with external provider(s): Discussed with patient, his blood work is reassuring, similar to past.  He is not fluid overloaded on exam, no pitting edema, is not having a wet hacking cough, lungs are clear.  We will have him follow-up with his primary care doctor.  His blood pressure was 100s to 120s, systolically, here overall well-appearing afebrile.  Reassuring head CT, with no acute changes, and reassuring labs.  Discharged home with strict return precautions no evidence of DKA, anion gap within normal limits.  Mentation is alert and oriented x 4    Final Clinical Impression(s) / ED Diagnoses Final diagnoses:  Encounter for medical screening examination  Leukocytosis,  unspecified type  Chronic systolic congestive heart failure (HCC)  Rx / DC Orders ED Discharge Orders     None         Pete Pelt, [place] 11/28/22 2121    Linwood Dibbles, MD 11/29/22 1128

## 2022-11-30 NOTE — Telephone Encounter (Signed)
Pt had CBC done on 11/18

## 2022-12-03 LAB — CULTURE, BLOOD (ROUTINE X 2)
Culture: NO GROWTH
Culture: NO GROWTH

## 2022-12-04 ENCOUNTER — Other Ambulatory Visit: Payer: Self-pay

## 2022-12-04 ENCOUNTER — Encounter (HOSPITAL_COMMUNITY): Payer: Self-pay

## 2022-12-04 ENCOUNTER — Emergency Department (HOSPITAL_COMMUNITY)
Admission: EM | Admit: 2022-12-04 | Discharge: 2022-12-05 | Disposition: A | Discharge status: Home / Self Care | Payer: No Typology Code available for payment source | Attending: Emergency Medicine | Admitting: Emergency Medicine

## 2022-12-04 DIAGNOSIS — N50811 Right testicular pain: Secondary | ICD-10-CM | POA: Insufficient documentation

## 2022-12-04 DIAGNOSIS — N50812 Left testicular pain: Secondary | ICD-10-CM | POA: Insufficient documentation

## 2022-12-04 DIAGNOSIS — I2510 Atherosclerotic heart disease of native coronary artery without angina pectoris: Secondary | ICD-10-CM | POA: Insufficient documentation

## 2022-12-04 DIAGNOSIS — Z7982 Long term (current) use of aspirin: Secondary | ICD-10-CM | POA: Diagnosis not present

## 2022-12-04 DIAGNOSIS — Z794 Long term (current) use of insulin: Secondary | ICD-10-CM | POA: Diagnosis not present

## 2022-12-04 DIAGNOSIS — M10071 Idiopathic gout, right ankle and foot: Secondary | ICD-10-CM | POA: Insufficient documentation

## 2022-12-04 DIAGNOSIS — G8929 Other chronic pain: Secondary | ICD-10-CM | POA: Insufficient documentation

## 2022-12-04 DIAGNOSIS — E119 Type 2 diabetes mellitus without complications: Secondary | ICD-10-CM | POA: Diagnosis not present

## 2022-12-04 DIAGNOSIS — I110 Hypertensive heart disease with heart failure: Secondary | ICD-10-CM | POA: Insufficient documentation

## 2022-12-04 DIAGNOSIS — I5040 Unspecified combined systolic (congestive) and diastolic (congestive) heart failure: Secondary | ICD-10-CM | POA: Diagnosis not present

## 2022-12-04 DIAGNOSIS — R6889 Other general symptoms and signs: Secondary | ICD-10-CM | POA: Diagnosis not present

## 2022-12-04 DIAGNOSIS — Z79899 Other long term (current) drug therapy: Secondary | ICD-10-CM | POA: Diagnosis not present

## 2022-12-04 DIAGNOSIS — R079 Chest pain, unspecified: Secondary | ICD-10-CM | POA: Diagnosis not present

## 2022-12-04 DIAGNOSIS — Z743 Need for continuous supervision: Secondary | ICD-10-CM | POA: Diagnosis not present

## 2022-12-04 DIAGNOSIS — R0902 Hypoxemia: Secondary | ICD-10-CM | POA: Diagnosis not present

## 2022-12-04 NOTE — ED Triage Notes (Signed)
Pt arrived from home via [facility] c/o testicular pain. Pt seen here at [facility] this month for similar complaint. Pt initially told EMS he has bilateral foot pain, as well as chest pain.

## 2022-12-05 ENCOUNTER — Telehealth: Payer: Self-pay | Admitting: *Deleted

## 2022-12-05 MED ORDER — TRAMADOL HCL 50 MG PO TABS: 50.0000 mg | ORAL_TABLET | Freq: Four times a day (QID) | ORAL | 0 refills | Status: DC | PRN

## 2022-12-05 MED ORDER — TRAMADOL HCL 50 MG PO TABS
50.0000 mg | ORAL_TABLET | Freq: Once | ORAL | Status: AC
  Administered 2022-12-05: 50 mg via ORAL
  Filled 2022-12-05: qty 1

## 2022-12-05 MED ORDER — PREDNISONE 50 MG PO TABS: 50.0000 mg | ORAL_TABLET | Freq: Every day | ORAL | 0 refills | Status: DC

## 2022-12-05 MED ORDER — PREDNISONE 50 MG PO TABS
60.0000 mg | ORAL_TABLET | Freq: Once | ORAL | Status: AC
  Administered 2022-12-05: 60 mg via ORAL
  Filled 2022-12-05: qty 1

## 2022-12-05 NOTE — Discharge Instructions (Addendum)
Apply ice to painful areas.  Ice should be applied for 30 minutes at a time, 4 times a day.  In addition to tramadol, you may take ibuprofen and/or acetaminophen.  Tramadol, ibuprofen, acetaminophen oral work on pain in different ways and can be taken together to get increased pain relief.

## 2022-12-05 NOTE — ED Notes (Signed)
Pt calling for a ride home at this time

## 2022-12-05 NOTE — ED Notes (Signed)
Pt attempting to call someone for a ride home.

## 2022-12-05 NOTE — ED Provider Notes (Signed)
[place] EMERGENCY DEPARTMENT AT [facility] Provider Note   CSN: [id number] Arrival date & time: 12/04/22  2155     History  Chief Complaint  Patient presents with   Testicle Pain    [name] is a 67 y.o. male.  The history is provided by the patient.  Testicle Pain  He has history of hypertension, diabetes, coronary artery disease, combined systolic and diastolic heart failure and comes in complaining of pain in both testicles and both feet for the last week.  He states he has been taking an unknown pain medicine which gave him partial relief of pain, but he had run out of that pain medicine.  He has history of foot pain and testicle pain and states that the pain that he has today is similar to what he has had in the past.  He denies any trauma.  Denies fever or chills.   Home Medications Prior to Admission medications   Medication Sig Start Date End Date Taking? Authorizing Provider  aspirin EC (ASPIRIN LOW DOSE) 81 MG tablet Take 1 tablet (81 mg total) by mouth daily. Swallow whole. 11/14/22   Catarina Hartshorn, MD  atorvastatin (LIPITOR) 40 MG tablet Take 1 tablet (40 mg total) by mouth at bedtime. Patient not taking: Reported on 11/02/2022 10/25/22 11/24/22  Vassie Loll, MD  carvedilol (COREG) 3.125 MG tablet Take 1 tablet (3.125 mg total) by mouth 2 (two) times daily with a meal. Patient not taking: Reported on 11/02/2022 10/25/22 11/24/22  Vassie Loll, MD  dexamethasone (DECADRON) 2 MG tablet Take 1 tablet (2 mg total) by mouth daily. 11/16/22   Catarina Hartshorn, MD  ferrous sulfate 325 (65 FE) MG tablet Take 1 tablet (325 mg total) by mouth daily with breakfast. 11/14/22   Tat, Onalee Hua, MD  folic acid (FOLVITE) 1 MG tablet Take 1 tablet (1 mg total) by mouth daily. 11/14/22   Catarina Hartshorn, MD  gabapentin (NEURONTIN) 300 MG capsule Take 1 capsule (300 mg total) by mouth 3 (three) times daily. 10/25/22   Vassie Loll, MD  insulin glargine (LANTUS) 100 UNIT/ML Solostar  Pen Inject 5 Units into the skin daily. 11/15/22   Catarina Hartshorn, MD  Insulin Pen Needle 31G X 5 MM MISC Use with insulin pen to dispense insulin as directed 11/15/22   Catarina Hartshorn, MD  linaclotide [facility]) 145 MCG CAPS capsule Take 1 capsule (145 mcg total) by mouth daily before breakfast. 11/14/22   Tat, Onalee Hua, MD  nicotine (NICODERM CQ - DOSED IN MG/24 HOURS) 21 mg/24hr patch Place 1 patch (21 mg total) onto the skin daily. 10/26/22   Vassie Loll, MD  pantoprazole (PROTONIX) 40 MG tablet Take 1 tablet (40 mg total) by mouth daily. Patient not taking: Reported on 11/02/2022 10/26/22   Vassie Loll, MD  torsemide (DEMADEX) 20 MG tablet Take 1 tablet (20 mg total) by mouth daily. 11/13/22   Catarina Hartshorn, MD  traMADol (ULTRAM) 50 MG tablet Take 1 tablet (50 mg total) by mouth every 6 (six) hours as needed. 11/18/22   Geoffery Lyons, MD      Allergies    Clopidogrel    Review of Systems   Review of Systems  Genitourinary:  Positive for testicular pain.  All other systems reviewed and are negative.   Physical Exam Updated Vital Signs BP (!) 91/57 (BP Location: Right Arm)   Pulse 97   Temp 98.4 F (36.9 C) (Oral)   Resp 18   Ht 5\' 9"  (1.753 m)  Wt 111 kg   SpO2 97%   BMI 36.14 kg/m  Physical Exam Vitals and nursing note reviewed.   67 year old male, resting comfortably and in no acute distress. Vital signs are normal. Oxygen saturation is 97%, which is normal. Head is normocephalic and atraumatic. PERRLA, EOMI.  Lungs are clear without rales, wheezes, or rhonchi. Chest is nontender. Heart has regular rate and rhythm without murmur. Abdomen is soft, flat, nontender. Genitalia: Circumcised penis, testicles descended without swelling, normal position of testicles.  There is diffuse tenderness to palpation through the bilateral scrotum without any masses. Extremities: There is erythema and swelling and tenderness over the right first MTP joint consistent with gout, remainder of extremity  exam is normal.  No tenderness to palpation anywhere in the left foot. Skin is warm and dry without rash. Neurologic: Mental status is normal, cranial nerves are intact, moves all extremities equally.  ED Results / Procedures / Treatments    Procedures Procedures    Medications Ordered in ED Medications  predniSONE (DELTASONE) tablet 60 mg (has no administration in time range)  traMADol (ULTRAM) tablet 50 mg (has no administration in time range)    ED Course/ Medical Decision Making/ A&P                                 Medical Decision Making Risk Prescription drug management.   Chronic testicular pain.  Right first MTP joint pain consistent with gout.  No evidence on exam of testicular torsion or epididymitis.  I have reviewed his past records, and he has had ED visits for testicular pain on 11/18/2022, 11/01/2022, 11/28/2021, 11/20/2021.  He has had scrotal ultrasounds on 11/01/2022 and 05/02/2014 showing a 1 cm cyst of the left epididymis which has been stable over time.  At this point, with history of testicular pain and exam not consistent with torsion or epididymitis, I do not feel that repeat imaging is necessary.  I have ordered a dose of prednisone and tramadol for pain and I am discharging him with prescriptions for prednisone and tramadol.  I am referring him back to his primary care provider for management of gout, I am referring him to urology for management of chronic testicular pain.  Final Clinical Impression(s) / ED Diagnoses Final diagnoses:  Acute idiopathic gout involving toe of right foot  Chronic pain in testicle    Rx / DC Orders ED Discharge Orders          Ordered    predniSONE (DELTASONE) 50 MG tablet  Daily        12/05/22 0122    traMADol (ULTRAM) 50 MG tablet  Every 6 hours PRN        12/05/22 0122              Dione Booze, MD 12/05/22 [phone]

## 2022-12-05 NOTE — Progress Notes (Signed)
  Care Coordination Note  12/05/2022 Name: [name] MRN: [id number] DOB: Mar 28, 1955  [name] is a 67 y.o. year old male who is a primary care patient of Felecia Shelling, [name], MD and is actively engaged with the care management team. I reached out to [name] by phone today to assist with re-scheduling an initial visit with the RN Case Manager  Follow up plan: Unsuccessful telephone outreach attempt made.   [facility]  Care Coordination Care Guide  Direct Dial: [phone]

## 2022-12-12 ENCOUNTER — Ambulatory Visit: Payer: Self-pay | Admitting: *Deleted

## 2022-12-12 ENCOUNTER — Encounter: Payer: Self-pay | Admitting: *Deleted

## 2022-12-12 DIAGNOSIS — F1490 Cocaine use, unspecified, uncomplicated: Secondary | ICD-10-CM

## 2022-12-12 DIAGNOSIS — I509 Heart failure, unspecified: Secondary | ICD-10-CM

## 2022-12-12 DIAGNOSIS — E1165 Type 2 diabetes mellitus with hyperglycemia: Secondary | ICD-10-CM

## 2022-12-12 DIAGNOSIS — Z91148 Patient's other noncompliance with medication regimen for other reason: Secondary | ICD-10-CM

## 2022-12-12 NOTE — Progress Notes (Signed)
  Care Coordination Note  12/12/2022 Name: [name] MRN: [id number] DOB: 09/12/1955  [name] is a 67 y.o. year old male who is a primary care patient of Felecia Shelling, [name], MD and is actively engaged with the care management team. I reached out to [name] by phone today to assist with re-scheduling an initial visit with the RN Case Manager  Follow up plan: Telephone appointment with care management team member scheduled for:12/12/22  [facility] Coordination Care Guide  Direct Dial: [phone]

## 2022-12-13 ENCOUNTER — Telehealth: Payer: Self-pay | Admitting: *Deleted

## 2022-12-13 ENCOUNTER — Encounter: Payer: Self-pay | Admitting: *Deleted

## 2022-12-13 NOTE — Patient Outreach (Signed)
Care Coordination   Initial Visit Note   12/12/2022 Name: [name] MRN: [id number] DOB: [birth year]  [name] is a 67 y.o. year old male who sees [name], [name], MD for primary care. I spoke with  [name] by phone today. Patient asked that I speak with his roommate and caregiver, [name], and then gave the phone to her. Verbal request also given to speak with daughter [name] regarding appointments, transportation, and personal care services.    What matters to the patients health and wellness today?  Reestablishing personal care services    Goals Addressed             This Visit's Progress    Care Management Needs       Care Management Goals: Patient will keep all medical appointments Patient will reach out to provider with any new or worsening symptoms Patient will take medication as prescribed Patient will obtain a blood pressure monitor Patient will obtain a glucose meter Patient will obtain scales Patient will obtain shower chair Patient will practice fall precautions and use assistive devices as needed for ambulation Patient will increase physical activity level to prevent further deconditioning Start by getting up and walking through the house at least every 2 hours  Increase as tolerated with an ultimate goal of 150 minute of moderate activity per week Patient will follow a low sodium, carbohydrate modified diet Patient will stop drinking soda and increase water intake Patient/caregiver will talk with Social Worker regarding level of care and transportation needs Patient/caregiver will reach out to Medical illustrator at [phone] as needed        SDOH assessments and interventions completed:  Yes  SDOH Interventions Today    Flowsheet Row Most Recent Value  SDOH Interventions   Housing Interventions Intervention Not Indicated  Transportation Interventions Patient Resources (Friends/Family)      Care Coordination  Interventions:  Yes, provided  Interventions Today    Flowsheet Row Most Recent Value  Chronic Disease   Chronic disease during today's visit Diabetes, Congestive Heart Failure (CHF), Other  [meningioma with encephalopathy and cerebral edema]  General Interventions   General Interventions Discussed/Reviewed General Interventions Discussed, General Interventions Reviewed, Labs, Durable Medical Equipment (DME), Doctor Visits, [employer], Level of Care, Communication with  Labs Hgb A1c every 3 months  Doctor Visits Discussed/Reviewed Doctor Visits Discussed, Doctor Visits Reviewed, PCP, Specialist  Durable Medical Equipment (DME) --  Rober Minion not have any DME but needs a glucometer, blood pressure monitor, scales, and shower chair]  PCP/Specialist Visits Compliance with follow-up visit  [Appts. with GI and Cardiology scheduled for 12/14/22. Caregiver, [name], states that those will have to be rescheduled by his daughter. Stressed importance of keeping F/U Appts. Will need face-to-face with PCP for DME orders.]  Communication with Social Work, Pharmacists, PCP/Specialists  Level of Care Personal Care Services  [Was receiving personal care services until recently. Staying with friend, [name]. Plans to move back home but will need personal care services restarted. [name] is unsure of previous agency name but would like to resume services with them.]  Exercise Interventions   Exercise Discussed/Reviewed Exercise Discussed, Exercise Reviewed, Physical Activity  [patient is able to ambulate but is inactive. Does not use assistive devices but caregiver thinks he would benefit from them. Relies on caregiver to assist with retrieving things from other rooms, food prep, transportation, etc.]  Physical Activity Discussed/Reviewed Physical Activity Discussed, Physical Activity Reviewed  Gayla Medicus importance of increasing physical activity and how  quickly one can become deconditioned if they do not at least get up  and move around. Encouraged to increase with an ultimate goal of 150 minutes per week.]  Education Interventions   Education Provided Provided Education  Provided Verbal Education On Nutrition, Exercise, When to see the doctor, Blood Sugar Monitoring, Medication, [employer], [employer] provided on [employer] through [employer] for transportation assistance]  Labs Reviewed --  Teacher, English as a foreign language mentioned patient likes to eat ice frequently. Discussed that his hemoglobin has been low. Stressed importance of following up with GI and taking iron supplements.]  Mental Health Interventions   Mental Health Discussed/Reviewed Refer to Social Work for resources  Refer to Social Work for resources regarding Other  [employer] and transportation services]  Nutrition Interventions   Nutrition Discussed/Reviewed Nutrition Discussed, Nutrition Reviewed, Portion sizes, Decreasing sugar intake, Carbohydrate meal planning, Adding fruits and vegetables, Decreasing salt, Fluid intake  [[name] prepares food and does not add salt to food. Eat 3 meals per day with 30 GM of CHO and up to 2 snacks per day, if needed, with less that 15 GM of CHO. Increase water intake. Stop drinking soda.]  Pharmacy Interventions   Pharmacy Dicussed/Reviewed Pharmacy Topics Discussed, Pharmacy Topics Reviewed, Medications and their functions, Medication Adherence, Referral to Pharmacist  [take medication as prescribed. Needs to check blood sugar prior to administering insulin]  Medication Adherence --  [name] assists with medication administration]  Referral to Pharmacist --  [Would benefit from prepackaged medications]  Safety Interventions   Safety Discussed/Reviewed Safety Discussed, Safety Reviewed, Fall Risk, Home Safety  Home Safety Assistive Devices, Contact provider for referral to PT/OT  [Encouraged use of assistive devicces for ambulation. Was transported to ED after fall about a month ago. No injuries found.]  Advanced  Directive Interventions   Advanced Directives Discussed/Reviewed Advanced Directives Discussed, Advanced Care Planning  [Enocuraged to consider. Will talk with daughter.]       Follow up plan: Follow up call scheduled for 12/14/22 with daughter, [name].    Encounter Outcome:  Patient Visit Completed   Demetrios Loll, RN, BSN Care Management Coordinator [facility]  [facility] Direct Dial: [phone] Main #: [phone]

## 2022-12-13 NOTE — Patient Outreach (Signed)
Care Coordination   Follow Up Visit Note   12/13/2022 Name: GIULIAN ASSI MRN: [id number] DOB: 1955/02/09  DONIVON EULBERG is a 67 y.o. year old male who sees [name], [name], MD for primary care. I  spoke with daughter, [name] by telephone, with verbal consent from patient.  What matters to the patients health and wellness today?  Reestablishing personal care services, having medications prepackaged, scheduling follow-up with urologist, and scheduling eye exam.     Goals Addressed             This Visit's Progress    Care Management Needs   On track    Care Management Goals: Patient will keep all medical appointments Patient will reach out to provider with any new or worsening symptoms Patient will take medication as prescribed Patient will obtain a blood pressure monitor Patient will obtain a glucose meter Patient will obtain scales Patient will obtain shower chair Patient will practice fall precautions and use assistive devices as needed for ambulation Patient will increase physical activity level to prevent further deconditioning Start by getting up and walking through the house at least every 2 hours  Increase as tolerated with an ultimate goal of 150 minute of moderate activity per week Patient will follow a low sodium, carbohydrate modified diet Patient will stop drinking soda and increase water intake Patient/caregiver will talk with Social Worker regarding level of care and transportation needs Patient/caregiver will reach out to Medical illustrator at [phone] as needed        SDOH assessments and interventions completed:  Yes SDOH Interventions Today    Flowsheet Row Most Recent Value  SDOH Interventions   Transportation Interventions Patient Resources (Friends/Family), AMB Referral  [limited by caregiver's inability to drive him outside of the county and by her own appointment schedule. Referral for transportation assistance placed at yesterday's  visit.]  Physical Activity Interventions Other (Comments)  [reached out to PCP requesting HHPT and HHOT orders]      Care Coordination Interventions:  Yes, provided  Interventions Today    Flowsheet Row Most Recent Value  Chronic Disease   Chronic disease during today's visit Diabetes, Hypertension (HTN), Congestive Heart Failure (CHF), Other  [meningioma with encephalopathy and cerebral edema]  General Interventions   General Interventions Discussed/Reviewed General Interventions Discussed, General Interventions Reviewed, Durable Medical Equipment (DME), Doctor Visits, Communication with  Labs --  [Needs to have repeat CBC to monitor anemia]  Doctor Visits Discussed/Reviewed Doctor Visits Reviewed, Doctor Visits Discussed, PCP, Specialist  Durable Medical Equipment (DME) --  Rober Minion not have any DME but needs a glucometer, blood pressure monitor, scales, and shower chair]  PCP/Specialist Visits Compliance with follow-up visit  [stressed importance of follow-up visits with specialists and PCP. Needs to schedule an appointment with urologist for chronic scrotal pain. Was seen in 12/2021 in [place] office but did not follow-up.]  Communication with Social Work, Pharmacists, PCP/Specialists  [Fax to PCP RE: orders for glucometer, BP monitor, scales, & shower chair & HHPT May need face-to-face visit. Staff message to pharmacy team & Social Workers Re: [employer] & transportation. Staff message to [facility] Re: need for F/U Appt. for scrotal pain.]  Level of Care Personal Care Services  [Per daughter, [name], services were through [facility]. Believes it was originally ordered by [facility] in [place]. Requesting previous caregiver, Latoya.]  Exercise Interventions   Exercise Discussed/Reviewed Exercise Discussed, Exercise Reviewed, Physical Activity  [daughter aware that patient's needs to increase activity  level to decrease risk for further deconditioning. She believes he  would benefit from physical therapy as well.]  Physical Activity Discussed/Reviewed Physical Activity Discussed, Physical Activity Reviewed  Education Interventions   Education Provided Provided Education  Provided Verbal Education On Exercise, Medication, When to see the doctor, Community Resources  Pharmacy Interventions   Pharmacy Dicussed/Reviewed Pharmacy Topics Discussed, Pharmacy Topics Reviewed, Medications and their functions  [daughter agrees that prepackaged medications would be helpful. Made aware of pharmacy referral.]  Safety Interventions   Safety Discussed/Reviewed Safety Discussed, Safety Reviewed, Fall Risk, Home Safety  Home Safety Assistive Devices  [Daughter states that patient should have rolling walker. Does not use as he should.]       Follow up plan: Follow up call scheduled for 12/21/22    Encounter Outcome:  Patient Visit Completed   Demetrios Loll, RN, BSN Care Management Coordinator [facility]  [facility] Direct Dial: [phone] Main #: [phone]

## 2022-12-14 ENCOUNTER — Ambulatory Visit (INDEPENDENT_AMBULATORY_CARE_PROVIDER_SITE_OTHER): Payer: 59 | Admitting: Gastroenterology

## 2022-12-14 ENCOUNTER — Ambulatory Visit: Payer: Self-pay | Admitting: *Deleted

## 2022-12-14 ENCOUNTER — Ambulatory Visit: Payer: 59 | Admitting: Student

## 2022-12-14 ENCOUNTER — Encounter: Payer: Self-pay | Admitting: *Deleted

## 2022-12-14 NOTE — Patient Outreach (Signed)
Care Coordination   Initial Visit Note   12/14/2022  Name: [name] MRN: [id number] DOB: 06/14/1955  [name] is a 67 y.o. year old male who sees [name], [name], MD for primary care. I spoke with [name] and friend, [name] by phone today.  What matters to the patients health and wellness today?  Find Help in My Community.    Goals Addressed               This Visit's Progress     Find Help in My Community. (pt-stated)   On track     Care Coordination Interventions:  Interventions Today    Flowsheet Row Most Recent Value  Chronic Disease   Chronic disease during today's visit Other, Diabetes, Congestive Heart Failure (CHF), Hypertension (HTN), Chronic Kidney Disease/End Stage Renal Disease (ESRD)  [Meningioma with Encephalopathy & Cerebral Edema, Cocaine Abuse, Tobacco Abuse, Inability to Perform Activities of Daily Living Independently, Caregiver Burnout, Stress & Fatigue, Chronic Pain]  General Interventions   General Interventions Discussed/Reviewed General Interventions Discussed, Labs, Vaccines, Doctor Visits, Health Screening, Annual Foot Exam, General Interventions Reviewed, Annual Eye Exam, Lipid Profile, Durable Medical Equipment (DME), Community Resources, Level of Care, Communication with  [Communication with Care Team Members]  Labs Hgb A1c every 3 months, Kidney Function  [Encouraged]  Vaccines COVID-19, Flu, Pneumonia, RSV, Shingles, Tetanus/Pertussis/Diphtheria  [Encouraged]  Doctor Visits Discussed/Reviewed Doctor Visits Discussed, Specialist, Doctor Visits Reviewed, Annual Wellness Visits, PCP  [Encouraged Routine Engagement]  Health Screening Bone Density, Colonoscopy, Prostate  [Encouraged]  Durable Medical Equipment (DME) Dan Humphreys, Other  [Rollator Walker]  PCP/Specialist Visits Compliance with follow-up visit  [Encouraged Routine Engagement]  Communication with PCP/Specialists, Charity fundraiser, Pharmacists, Social Work  [employer]  Routine Engagement]  Level of Care Adult Daycare, Air traffic controller, Assisted Living, Skilled Nursing Facility  [Confirmed Disinterest in Enrollment in Adult Day Care Program or Pursuing Higher Level of Care Placement Options (I.e Assisted Living or Skilled Nursing Facility).]  Applications Medicaid, Personal Care Services  [Confirmed Interest in Reapplying for Medicaid & Personal Care Services,  Applications Mailed & Offered to Assist with Completion & Submission.]  Exercise Interventions   Exercise Discussed/Reviewed Exercise Discussed, Assistive device use and maintanence, Exercise Reviewed, Physical Activity, Weight Managment  [Encouraged]  Physical Activity Discussed/Reviewed Physical Activity Discussed, Home Exercise Program (HEP), Physical Activity Reviewed, PREP, Gym, Types of exercise  [Encouraged]  Weight Management Weight loss  [Encouraged]  Education Interventions   Education Provided Provided Therapist, sports, Provided Web-based Education, Provided Education  [Encouraged Review.]  Provided Engineer, petroleum On Nutrition, Mental Health/Coping with Illness, When to see the doctor, Foot Care, Eye Care, Labs, Blood Sugar Monitoring, Applications, Exercise, Medication, [employer], Development worker, community  [Encouraged Consideration & Implementation.]  Labs Reviewed --  [Low Hemoglobin Level,  Encouraged to Follow-Up with Primary Care Provider.]  Applications Medicaid, Personal Care Services  [Confirmed Interest in Reapplying for Medicaid & Personal Care Services,  Applications Mailed & Offered to Assist with Completion & Submission.]  Mental Health Interventions   Mental Health Discussed/Reviewed Mental Health Discussed, Anxiety, Depression, Grief and Loss, Mental Health Reviewed, Substance Abuse, Coping Strategies, Suicide, Crisis, Other  [Assessed Mental Health Status & Cognitive Status.]  Nutrition Interventions   Nutrition Discussed/Reviewed Nutrition Discussed, Adding fruits and vegetables,  Increasing proteins, Decreasing fats, Nutrition Reviewed, Fluid intake, Decreasing salt, Portion sizes, Carbohydrate meal planning, Decreasing sugar intake  [Encouraged]  Pharmacy Interventions   Pharmacy Dicussed/Reviewed Pharmacy Topics Discussed, Medications and their functions, Medication Adherence, Pharmacy Topics  Reviewed, Affording Medications  [Discussed Importance of Compliance.]  Medication Adherence --  [Confirmed Compliance with Medications, Administered by Orest Dikes Hicks.]  Referral to Pharmacist --  Edilia Bo Ability to Afford Prescription Medications.]  Safety Interventions   Safety Discussed/Reviewed Safety Discussed, Safety Reviewed, Fall Risk, Home Safety  [Fall Risk, Unsteady Balance/Gait, History of Falls & Encouraged Routine Use of Assistive Devices.]  Home Safety Assistive Devices, Need for home safety assessment, Contact provider for referral to PT/OT, Refer for community resources, Refer for home visit  [Encouraged Consideration of Home Safety Evaluation,  Request Placed for Orders for Home Health Physical & Occupational Therapy Services,  Encouraged Routine Use of Rollator Walker]  Advanced Directive Interventions   Advanced Directives Discussed/Reviewed Advanced Directives Discussed, Advanced Directives Reviewed  [Encouraged Initiation of Advanced Directives, Offering to [employer] & Assist with Completion.]      Assessed Social Determinant of Health Barriers. Discussed Plans for Ongoing Care Management Follow Up. Provided Careers information officer Information for Care Management Team Members. Screened for Signs & Symptoms of Depression, Related to Chronic Disease State. PHQ2 & PHQ9 Depression Screen Completed & Results Reviewed. Suicidal Ideation & Homicidal Ideation Assessed - None Present.   Domestic Violence Assessed - None Present. Access to Weapons Assessed - None Present.   Active Listening & Reflection Utilized. Verbalization of Feelings Encouraged. Emotional  Support Provided. Feelings of Caregiver Burnout, Stress & Fatigue Acknowledged. Caregiver Stress Acknowledged. Caregiver Resources Reviewed. Caregiver Support Groups Mailed. Self-Enrollment in Caregiver Support Group of Interest Emphasized. Crisis Support Information, Agencies, Services & Resources Discussed. Problem Solving Interventions Identified. Task-Centered Solutions Implemented.   Solution-Focused Strategies Developed. Acceptance & Commitment Therapy Introduced. Brief Cognitive Behavioral Therapy Initiated. Client-Centered Therapy Enacted. Reviewed Prescription Medications & Discussed Importance of Compliance. Quality of Sleep Assessed & Sleep Hygiene Techniques Promoted. Emphasized Importance of Quitting Smoking & Offered Smoking Cessation Classes, Services, Agencies & Resources. Confirmed Interest in Reinstating Personal Care Services, through [facility] [phone]), Upon Return Home to Live Independently.  Encouraged Routine Engagement with Danford Bad, Licensed Clinical Social Worker with [facility] [phone]), if You Have Questions, Need Assistance, or If Additional Social Work Needs Are Identified Between Now & Our Next Follow-Up Outreach Call, Scheduled on 12/21/2022 at 1:00 PM.        SDOH assessments and interventions completed:  Yes.  SDOH Interventions Today    Flowsheet Row Most Recent Value  SDOH Interventions   Food Insecurity Interventions Intervention Not Indicated  Housing Interventions Intervention Not Indicated  Utilities Interventions Intervention Not Indicated  Alcohol Usage Interventions Intervention Not Indicated (Score <7)  [Denies Alcohol Use at Present.]  Financial Strain Interventions Intervention Not Indicated  Physical Activity Interventions Patient Declined, Other (Comments)  [Agreeable to Home Health Physical & Occupational Therapy Services - Orders Requested.]  Stress Interventions Offered  [employer], Provide Counseling, Other (Comment)  [Offered Counseling Agencies, Nurse, adult, Resources & Support Programs.]  Social Connections Interventions Patient Declined  Health Literacy Interventions Intervention Not Indicated     Care Coordination Interventions:  Yes, provided.   Follow up plan: Follow up call scheduled for 12/21/2022 at 1:00 pm.  Encounter Outcome:  Patient Visit Completed.   Danford Bad, BSW, MSW, Printmaker Social Work Case Set designer Health  [facility], Population Health Direct Dial: [phone]  Fax: [phone] Email: [email or web address]

## 2022-12-14 NOTE — Patient Instructions (Signed)
Visit Information  Thank you for taking time to visit with me today. Please don't hesitate to contact me if I can be of assistance to you.   Following are the goals we discussed today:   Goals Addressed               This Visit's Progress     Find Help in My Community. (pt-stated)   On track     Care Coordination Interventions:  Interventions Today    Flowsheet Row Most Recent Value  Chronic Disease   Chronic disease during today's visit Other, Diabetes, Congestive Heart Failure (CHF), Hypertension (HTN), Chronic Kidney Disease/End Stage Renal Disease (ESRD)  [Meningioma with Encephalopathy & Cerebral Edema, Cocaine Abuse, Tobacco Abuse, Inability to Perform Activities of Daily Living Independently, Caregiver Burnout, Stress & Fatigue, Chronic Pain]  General Interventions   General Interventions Discussed/Reviewed General Interventions Discussed, Labs, Vaccines, Doctor Visits, Health Screening, Annual Foot Exam, General Interventions Reviewed, Annual Eye Exam, Lipid Profile, Durable Medical Equipment (DME), Community Resources, Level of Care, Communication with  [Communication with Care Team Members]  Labs Hgb A1c every 3 months, Kidney Function  [Encouraged]  Vaccines COVID-19, Flu, Pneumonia, RSV, Shingles, Tetanus/Pertussis/Diphtheria  [Encouraged]  Doctor Visits Discussed/Reviewed Doctor Visits Discussed, Specialist, Doctor Visits Reviewed, Annual Wellness Visits, PCP  [Encouraged Routine Engagement]  Health Screening Bone Density, Colonoscopy, Prostate  [Encouraged]  Durable Medical Equipment (DME) Dan Humphreys, Other  [Rollator Walker]  PCP/Specialist Visits Compliance with follow-up visit  [Encouraged Routine Engagement]  Communication with PCP/Specialists, Charity fundraiser, Pharmacists, Social Work  [employer] Routine Engagement]  Level of Care Adult Daycare, Air traffic controller, Assisted Living, Skilled Nursing Facility  [Confirmed Disinterest in Enrollment in Adult Day Care Program or Pursuing Higher  Level of Care Placement Options (I.e Assisted Living or Skilled Nursing Facility).]  Applications Medicaid, Personal Care Services  [Confirmed Interest in Reapplying for Medicaid & Personal Care Services,  Applications Mailed & Offered to Assist with Completion & Submission.]  Exercise Interventions   Exercise Discussed/Reviewed Exercise Discussed, Assistive device use and maintanence, Exercise Reviewed, Physical Activity, Weight Managment  [Encouraged]  Physical Activity Discussed/Reviewed Physical Activity Discussed, Home Exercise Program (HEP), Physical Activity Reviewed, PREP, Gym, Types of exercise  [Encouraged]  Weight Management Weight loss  [Encouraged]  Education Interventions   Education Provided Provided Therapist, sports, Provided Web-based Education, Provided Education  [Encouraged Review.]  Provided Engineer, petroleum On Nutrition, Mental Health/Coping with Illness, When to see the doctor, Foot Care, Eye Care, Labs, Blood Sugar Monitoring, Applications, Exercise, Medication, [employer], Development worker, community  [Encouraged Consideration & Implementation.]  Labs Reviewed --  [Low Hemoglobin Level,  Encouraged to Follow-Up with Primary Care Provider.]  Applications Medicaid, Personal Care Services  [Confirmed Interest in Reapplying for Medicaid & Personal Care Services,  Applications Mailed & Offered to Assist with Completion & Submission.]  Mental Health Interventions   Mental Health Discussed/Reviewed Mental Health Discussed, Anxiety, Depression, Grief and Loss, Mental Health Reviewed, Substance Abuse, Coping Strategies, Suicide, Crisis, Other  [Assessed Mental Health Status & Cognitive Status.]  Nutrition Interventions   Nutrition Discussed/Reviewed Nutrition Discussed, Adding fruits and vegetables, Increasing proteins, Decreasing fats, Nutrition Reviewed, Fluid intake, Decreasing salt, Portion sizes, Carbohydrate meal planning, Decreasing sugar intake  [Encouraged]  Pharmacy  Interventions   Pharmacy Dicussed/Reviewed Pharmacy Topics Discussed, Medications and their functions, Medication Adherence, Pharmacy Topics Reviewed, Affording Medications  [Discussed Importance of Compliance.]  Medication Adherence --  [Confirmed Compliance with Medications, Administered by Orest Dikes Hicks.]  Referral to Pharmacist --  [Confirmed Ability to [facility] Prescription  Medications.]  Safety Interventions   Safety Discussed/Reviewed Safety Discussed, Safety Reviewed, Fall Risk, Home Safety  [Fall Risk, Unsteady Balance/Gait, History of Falls & Encouraged Routine Use of Assistive Devices.]  Home Safety Assistive Devices, Need for home safety assessment, Contact provider for referral to PT/OT, Refer for community resources, Refer for home visit  [Encouraged Consideration of Home Safety Evaluation,  Request Placed for Orders for Home Health Physical & Occupational Therapy Services,  Encouraged Routine Use of Rollator Walker]  Advanced Directive Interventions   Advanced Directives Discussed/Reviewed Advanced Directives Discussed, Advanced Directives Reviewed  [Encouraged Initiation of Advanced Directives, Offering to [employer] & Assist with Completion.]      Assessed Social Determinant of Health Barriers. Discussed Plans for Ongoing Care Management Follow Up. Provided Careers information officer Information for Care Management Team Members. Screened for Signs & Symptoms of Depression, Related to Chronic Disease State. PHQ2 & PHQ9 Depression Screen Completed & Results Reviewed. Suicidal Ideation & Homicidal Ideation Assessed - None Present.   Domestic Violence Assessed - None Present. Access to Weapons Assessed - None Present.   Active Listening & Reflection Utilized. Verbalization of Feelings Encouraged. Emotional Support Provided. Feelings of Caregiver Burnout, Stress & Fatigue Acknowledged. Caregiver Stress Acknowledged. Caregiver Resources Reviewed. Caregiver Support Groups  Mailed. Self-Enrollment in Caregiver Support Group of Interest Emphasized. Crisis Support Information, Agencies, Services & Resources Discussed. Problem Solving Interventions Identified. Task-Centered Solutions Implemented.   Solution-Focused Strategies Developed. Acceptance & Commitment Therapy Introduced. Brief Cognitive Behavioral Therapy Initiated. Client-Centered Therapy Enacted. Reviewed Prescription Medications & Discussed Importance of Compliance. Quality of Sleep Assessed & Sleep Hygiene Techniques Promoted. Emphasized Importance of Quitting Smoking & Offered Smoking Cessation Classes, Services, Agencies & Resources. Confirmed Interest in Reinstating Personal Care Services, through [facility] [phone]), Upon Return Home to Live Independently.  Encouraged Routine Engagement with Danford Bad, Licensed Clinical Social Worker with [facility] [phone]), if You Have Questions, Need Assistance, or If Additional Social Work Needs Are Identified Between Now & Our Next Follow-Up Outreach Call, Scheduled on 12/21/2022 at 1:00 PM.      Our next appointment is by telephone on 12/21/2022 at 1:00 pm.  Please call the care guide team at [phone] if you need to cancel or reschedule your appointment.   If you are experiencing a Mental Health or Behavioral Health Crisis or need someone to talk to, please call the Suicide and Crisis Lifeline: 988 call the [place] National Suicide Prevention Lifeline: (469) 276-8764 or TTY: 236-783-4865 TTY 9076675340) to talk to a trained counselor call 1-800-273-TALK (toll free, 24 hour hotline) go to [facility] [address] [place] [phone]) call the [facility] Crisis Line: [phone] call 911  Patient verbalizes understanding of instructions and care plan provided today and agrees to view in MyChart. Active MyChart status and patient understanding of  how to access instructions and care plan via MyChart confirmed with patient.     Telephone follow up appointment with care management team member scheduled for:  12/21/2022 at 1:00 pm.  Danford Bad, BSW, MSW, LCSW  Embedded Practice Social Work Case Manager  [facility], Population Health Direct Dial: [phone]  Fax: [phone] Email: [email or web address]

## 2022-12-18 ENCOUNTER — Inpatient Hospital Stay (HOSPITAL_COMMUNITY)
Admission: EM | Admit: 2022-12-18 | Discharge: 2022-12-24 | DRG: 872 | Disposition: A | Discharge status: Home Health | Payer: No Typology Code available for payment source | Attending: Internal Medicine | Admitting: Internal Medicine

## 2022-12-18 ENCOUNTER — Encounter (HOSPITAL_COMMUNITY): Payer: Self-pay | Admitting: Emergency Medicine

## 2022-12-18 ENCOUNTER — Other Ambulatory Visit: Payer: Self-pay

## 2022-12-18 ENCOUNTER — Emergency Department (HOSPITAL_COMMUNITY): Payer: No Typology Code available for payment source

## 2022-12-18 DIAGNOSIS — I2510 Atherosclerotic heart disease of native coronary artery without angina pectoris: Secondary | ICD-10-CM | POA: Diagnosis present

## 2022-12-18 DIAGNOSIS — E1165 Type 2 diabetes mellitus with hyperglycemia: Secondary | ICD-10-CM | POA: Diagnosis present

## 2022-12-18 DIAGNOSIS — R6889 Other general symptoms and signs: Secondary | ICD-10-CM | POA: Diagnosis not present

## 2022-12-18 DIAGNOSIS — L0231 Cutaneous abscess of buttock: Secondary | ICD-10-CM | POA: Diagnosis present

## 2022-12-18 DIAGNOSIS — E1122 Type 2 diabetes mellitus with diabetic chronic kidney disease: Secondary | ICD-10-CM | POA: Diagnosis present

## 2022-12-18 DIAGNOSIS — Z743 Need for continuous supervision: Secondary | ICD-10-CM | POA: Diagnosis not present

## 2022-12-18 DIAGNOSIS — Z794 Long term (current) use of insulin: Secondary | ICD-10-CM

## 2022-12-18 DIAGNOSIS — E1142 Type 2 diabetes mellitus with diabetic polyneuropathy: Secondary | ICD-10-CM | POA: Diagnosis present

## 2022-12-18 DIAGNOSIS — K610 Anal abscess: Secondary | ICD-10-CM | POA: Diagnosis not present

## 2022-12-18 DIAGNOSIS — Z8249 Family history of ischemic heart disease and other diseases of the circulatory system: Secondary | ICD-10-CM

## 2022-12-18 DIAGNOSIS — I5042 Chronic combined systolic (congestive) and diastolic (congestive) heart failure: Secondary | ICD-10-CM | POA: Diagnosis present

## 2022-12-18 DIAGNOSIS — I745 Embolism and thrombosis of iliac artery: Secondary | ICD-10-CM | POA: Diagnosis present

## 2022-12-18 DIAGNOSIS — D329 Benign neoplasm of meninges, unspecified: Secondary | ICD-10-CM | POA: Diagnosis present

## 2022-12-18 DIAGNOSIS — E785 Hyperlipidemia, unspecified: Secondary | ICD-10-CM | POA: Diagnosis present

## 2022-12-18 DIAGNOSIS — I959 Hypotension, unspecified: Secondary | ICD-10-CM | POA: Diagnosis not present

## 2022-12-18 DIAGNOSIS — I130 Hypertensive heart and chronic kidney disease with heart failure and stage 1 through stage 4 chronic kidney disease, or unspecified chronic kidney disease: Secondary | ICD-10-CM | POA: Diagnosis present

## 2022-12-18 DIAGNOSIS — I252 Old myocardial infarction: Secondary | ICD-10-CM

## 2022-12-18 DIAGNOSIS — Z888 Allergy status to other drugs, medicaments and biological substances status: Secondary | ICD-10-CM

## 2022-12-18 DIAGNOSIS — Z955 Presence of coronary angioplasty implant and graft: Secondary | ICD-10-CM

## 2022-12-18 DIAGNOSIS — R7989 Other specified abnormal findings of blood chemistry: Secondary | ICD-10-CM | POA: Diagnosis present

## 2022-12-18 DIAGNOSIS — A419 Sepsis, unspecified organism: Secondary | ICD-10-CM | POA: Diagnosis not present

## 2022-12-18 DIAGNOSIS — Y93E1 Activity, personal bathing and showering: Secondary | ICD-10-CM

## 2022-12-18 DIAGNOSIS — I9589 Other hypotension: Secondary | ICD-10-CM | POA: Diagnosis present

## 2022-12-18 DIAGNOSIS — Z833 Family history of diabetes mellitus: Secondary | ICD-10-CM

## 2022-12-18 DIAGNOSIS — R6520 Severe sepsis without septic shock: Secondary | ICD-10-CM | POA: Diagnosis present

## 2022-12-18 DIAGNOSIS — F17200 Nicotine dependence, unspecified, uncomplicated: Secondary | ICD-10-CM | POA: Diagnosis present

## 2022-12-18 DIAGNOSIS — D631 Anemia in chronic kidney disease: Secondary | ICD-10-CM | POA: Diagnosis present

## 2022-12-18 DIAGNOSIS — Z7984 Long term (current) use of oral hypoglycemic drugs: Secondary | ICD-10-CM

## 2022-12-18 DIAGNOSIS — D509 Iron deficiency anemia, unspecified: Secondary | ICD-10-CM | POA: Diagnosis present

## 2022-12-18 DIAGNOSIS — N1832 Chronic kidney disease, stage 3b: Secondary | ICD-10-CM | POA: Diagnosis present

## 2022-12-18 DIAGNOSIS — F17210 Nicotine dependence, cigarettes, uncomplicated: Secondary | ICD-10-CM | POA: Diagnosis present

## 2022-12-18 DIAGNOSIS — Z7952 Long term (current) use of systemic steroids: Secondary | ICD-10-CM

## 2022-12-18 DIAGNOSIS — K219 Gastro-esophageal reflux disease without esophagitis: Secondary | ICD-10-CM | POA: Diagnosis present

## 2022-12-18 DIAGNOSIS — Z9049 Acquired absence of other specified parts of digestive tract: Secondary | ICD-10-CM

## 2022-12-18 DIAGNOSIS — D75839 Thrombocytosis, unspecified: Secondary | ICD-10-CM | POA: Diagnosis present

## 2022-12-18 DIAGNOSIS — Z79899 Other long term (current) drug therapy: Secondary | ICD-10-CM

## 2022-12-18 DIAGNOSIS — Z7982 Long term (current) use of aspirin: Secondary | ICD-10-CM

## 2022-12-18 DIAGNOSIS — E872 Acidosis, unspecified: Secondary | ICD-10-CM | POA: Diagnosis present

## 2022-12-18 DIAGNOSIS — D62 Acute posthemorrhagic anemia: Secondary | ICD-10-CM | POA: Diagnosis present

## 2022-12-18 DIAGNOSIS — E1149 Type 2 diabetes mellitus with other diabetic neurological complication: Secondary | ICD-10-CM | POA: Diagnosis present

## 2022-12-18 DIAGNOSIS — Z1152 Encounter for screening for COVID-19: Secondary | ICD-10-CM

## 2022-12-18 DIAGNOSIS — G4733 Obstructive sleep apnea (adult) (pediatric): Secondary | ICD-10-CM | POA: Diagnosis present

## 2022-12-18 DIAGNOSIS — E669 Obesity, unspecified: Secondary | ICD-10-CM | POA: Diagnosis present

## 2022-12-18 DIAGNOSIS — R748 Abnormal levels of other serum enzymes: Secondary | ICD-10-CM | POA: Diagnosis present

## 2022-12-18 DIAGNOSIS — W182XXA Fall in (into) shower or empty bathtub, initial encounter: Secondary | ICD-10-CM | POA: Diagnosis present

## 2022-12-18 DIAGNOSIS — Z6836 Body mass index (BMI) 36.0-36.9, adult: Secondary | ICD-10-CM

## 2022-12-18 DIAGNOSIS — E861 Hypovolemia: Secondary | ICD-10-CM | POA: Diagnosis present

## 2022-12-18 LAB — CBG MONITORING, ED: Glucose-Capillary: 122 mg/dL — ABNORMAL HIGH (ref 70–99)

## 2022-12-18 MED ORDER — FENTANYL CITRATE PF 50 MCG/ML IJ SOSY
25.0000 ug | PREFILLED_SYRINGE | Freq: Once | INTRAMUSCULAR | Status: AC
Start: 2022-12-18 — End: 2022-12-18
  Administered 2022-12-18: 25 ug via INTRAVENOUS
  Filled 2022-12-18: qty 1

## 2022-12-18 MED ORDER — LACTATED RINGERS IV BOLUS: 500.0000 mL | Freq: Once | INTRAVENOUS | Status: DC

## 2022-12-18 MED ORDER — SODIUM CHLORIDE 0.9 % IV SOLN
2.0000 g | Freq: Once | INTRAVENOUS | Status: AC
  Administered 2022-12-19: 2 g via INTRAVENOUS
  Filled 2022-12-18: qty 20

## 2022-12-18 MED ORDER — SODIUM CHLORIDE 0.9 % IV BOLUS
500.0000 mL | Freq: Once | INTRAVENOUS | Status: AC
  Administered 2022-12-18: 500 mL via INTRAVENOUS

## 2022-12-18 NOTE — Sepsis Progress Note (Signed)
Elink following code sepsis °

## 2022-12-18 NOTE — ED Notes (Signed)
Pt's O2 sats to 84%. Placed pt on O2 @ 2l [place] and sats increased to 96%.

## 2022-12-18 NOTE — ED Provider Notes (Signed)
[facility]-EMERGENCY DEPT [facility] Emergency Department Provider Note MRN:  [id number]  Arrival date & time: 12/19/22     Chief Complaint   Fall   History of Present Illness   [name] is a 67 y.o. year-old male with a history of CAD, CKD, substance use disorder, diabetes presenting to the ED with chief complaint of fall.  Patient is complaining of pain all over, not sure if he started having pain before or after he fell in the shower.  Does not have a specific area where he hurts the most.  Denies recent illness.  Review of Systems  A thorough review of systems was obtained and all systems are negative except as noted in the HPI and PMH.   Patient's Health History    Past Medical History:  Diagnosis Date   Bulging lumbar disc    CAD (coronary artery disease) [phone]   a. prior LAD stenting. b. s/p DES to [facility] 08/2015. c. 04/2016 Cardiac cath at [facility]. Patent stent in the PLAD and RCA. Diffuse dLAD, OM2, and  RPDA disease. d. DES to PDA and distal RCA 03/2019    Chronic lower back pain    CKD (chronic kidney disease), stage II    Cocaine abuse (HCC)    Cyst of epididymis    DM2 (diabetes mellitus, type 2) (HCC)    Essential hypertension    Fournier gangrene    GERD (gastroesophageal reflux disease)    Headache    History of pneumonia    Hyperlipidemia    Ischemic cardiomyopathy    LV (left ventricular) mural thrombus    Sleep apnea     Past Surgical History:  Procedure Laterality Date   APPENDECTOMY     BIOPSY  11/11/2022   Procedure: BIOPSY;  Surgeon: Franky Macho, MD;  Location: [facility] ENDO SUITE;  Service: Endoscopy;;   BRONCHIAL NEEDLE ASPIRATION BIOPSY  11/12/2021   Procedure: BRONCHIAL NEEDLE ASPIRATION BIOPSIES;  Surgeon: Omar Person, MD;  Location: [facility] ENDOSCOPY;  Service: Pulmonary;;   CARDIAC CATHETERIZATION N/A 09/07/2015   Procedure: Left Heart Cath and Coronary Angiography;  Surgeon: Marykay Lex, MD;  Location: [facility] INVASIVE CV  LAB;  Service: Cardiovascular;  Laterality: N/A;   CARDIAC CATHETERIZATION N/A 09/07/2015   Procedure: Coronary Stent Intervention;  Surgeon: Marykay Lex, MD;  Location: [facility] INVASIVE CV LAB;  Service: Cardiovascular;  Laterality: N/A;   COLONOSCOPY WITH PROPOFOL N/A 11/11/2022   Procedure: COLONOSCOPY WITH PROPOFOL;  Surgeon: Franky Macho, MD;  Location: [facility] ENDO SUITE;  Service: Endoscopy;  Laterality: N/A;   CORONARY ANGIOGRAM  09/07/13   residual RCA and OM disease   CORONARY ANGIOPLASTY WITH STENT PLACEMENT     CORONARY STENT INTERVENTION N/A 10/16/2018   Procedure: CORONARY STENT INTERVENTION;  Surgeon: Kathleene Hazel, MD;  Location: [facility] INVASIVE CV LAB;  Service: Cardiovascular;  Laterality: N/A;   CORONARY STENT INTERVENTION N/A 03/11/2019   Procedure: CORONARY STENT INTERVENTION;  Surgeon: Corky Crafts, MD;  Location: [facility] INVASIVE CV LAB;  Service: Cardiovascular;  Laterality: N/A;   ESOPHAGOGASTRODUODENOSCOPY (EGD) WITH PROPOFOL N/A 11/11/2022   Procedure: ESOPHAGOGASTRODUODENOSCOPY (EGD) WITH PROPOFOL;  Surgeon: Franky Macho, MD;  Location: [facility] ENDO SUITE;  Service: Endoscopy;  Laterality: N/A;   FRACTURE SURGERY     HEMOSTASIS CLIP PLACEMENT  11/11/2022   Procedure: HEMOSTASIS CLIP PLACEMENT;  Surgeon: Franky Macho, MD;  Location: [facility] ENDO SUITE;  Service: Endoscopy;;   INCISION AND DRAINAGE OF WOUND Left 05/19/2019  Procedure: DEBRIDEMENT LEFT GROIN;  Surgeon: Violeta Gelinas, MD;  Location: [facility] OR;  Service: General;  Laterality: Left;   INSERTION OF ILIAC STENT Left 11/30/2017   Left external illiac stent   INSERTION OF ILIAC STENT  11/30/2017   Procedure: Insertion Of Iliac Stent;  Surgeon: Runell Gess, MD;  Location: [facility] INVASIVE CV LAB;  Service: Cardiovascular;;  Left external illiac stent   KNEE ARTHROSCOPY Left    KNEE SURGERY     "ligaments, cartilage; tendon, put a pin in" (11/30/2017)   LEFT HEART CATH Bilateral 07/08/2012   Procedure: LEFT  HEART CATH;  Surgeon: Corky Crafts, MD;  Location: [facility] CATH LAB;  Service: Cardiovascular;  Laterality: Bilateral;   LEFT HEART CATH AND CORONARY ANGIOGRAPHY N/A 10/16/2018   Procedure: LEFT HEART CATH AND CORONARY ANGIOGRAPHY;  Surgeon: Kathleene Hazel, MD;  Location: [facility] INVASIVE CV LAB;  Service: Cardiovascular;  Laterality: N/A;   LEFT HEART CATH AND CORONARY ANGIOGRAPHY N/A 03/11/2019   Procedure: LEFT HEART CATH AND CORONARY ANGIOGRAPHY;  Surgeon: Corky Crafts, MD;  Location: [facility] INVASIVE CV LAB;  Service: Cardiovascular;  Laterality: N/A;   LEFT HEART CATHETERIZATION WITH CORONARY ANGIOGRAM N/A 09/06/2013   STEMI, 2nd ISR LAD. Procedure: LEFT HEART CATHETERIZATION WITH CORONARY ANGIOGRAM;  Surgeon: Corky Crafts, MD;  Location: [facility] CATH LAB;  Service: Cardiovascular;  Laterality: N/A;   LOWER EXTREMITY ANGIOGRAPHY N/A 11/30/2017   Procedure: LOWER EXTREMITY ANGIOGRAPHY;  Surgeon: Runell Gess, MD;  Location: [facility] INVASIVE CV LAB;  Service: Cardiovascular;  Laterality: N/A;   PERCUTANEOUS CORONARY STENT INTERVENTION (PCI-S)  07/08/2012   Procedure: PERCUTANEOUS CORONARY STENT INTERVENTION (PCI-S);  Surgeon: Corky Crafts, MD;  Location: [facility] CATH LAB;  Service: Cardiovascular;;  DES LAD   PERCUTANEOUS CORONARY STENT INTERVENTION (PCI-S) N/A 09/06/2013   Procedure: PERCUTANEOUS CORONARY STENT INTERVENTION (PCI-S);  Surgeon: Corky Crafts, MD;  Location: [facility] CATH LAB;  Service: Cardiovascular;  Laterality: N/A;  Mid LAD 3.0/24mm Promus   POLYPECTOMY  11/11/2022   Procedure: POLYPECTOMY INTESTINAL;  Surgeon: Franky Macho, MD;  Location: [facility];  Service: Endoscopy;;   SCLEROTHERAPY  11/11/2022   Procedure: Susa Day;  Surgeon: Franky Macho, MD;  Location[facility];  Service: Endoscopy;;   SUBMUCOSAL TATTOO INJECTION  11/11/2022   Procedure: SUBMUCOSAL TATTOO INJECTION;  Surgeon: Franky Macho, MD;  Location: [facility] END[facility];  Service:  Endoscopy;;   THORACENTESIS Right 11/10/2021   Procedure: Alanson Puls;  Surgeon: Leslye Peer, MD;  Location: [facility] ENDOSCOPY;  Service: Cardiopulmonary;  Laterality: Right;   VIDEO BRONCHOSCOPY WITH ENDOBRONCHIAL ULTRASOUND Right 11/12/2021   Procedure: VIDEO BRONCHOSCOPY WITH ENDOBRONCHIAL ULTRASOUND;  Surgeon: Omar Person, MD;  Location: [facility] ENDOSCOPY;  Service: Pulmonary;  Laterality: Right;   WOUND DEBRIDEMENT Left 05/20/2019   Procedure: DEBRIDEMENT GROIN;  Surgeon: Kinsinger, De Blanch, MD;  Location: [facility] OR;  Service: General;  Laterality: Left;   WRIST FRACTURE SURGERY Bilateral     Family History  Problem Relation Age of Onset   Hypertension Mother    Diabetes Mother     Social History   Socioeconomic History   Marital status: Divorced    Spouse name: Not on file   Number of children: 3   Years of education: 48   Highest education level: 12th grade  Occupational History   Not on file  Tobacco Use   Smoking status: Every Day    Current packs/day: 0.50    Average packs/day: 0.5 packs/day for 48.0  years (24.0 ttl pk-yrs)    Types: Cigarettes    Passive exposure: Current   Smokeless tobacco: Never   Tobacco comments:    Smoking Cessation Classes, Agencies, Services & Resources Offered.  Vaping Use   Vaping status: Never Used  Substance and Sexual Activity   Alcohol use: Not Currently    Comment: 11/30/2017 "might drink a beer q 6 months"   Drug use: Not Currently    Types: Cocaine   Sexual activity: Not Currently    Partners: Female  Other Topics Concern   Not on file  Social History Narrative   ** Merged History Encounter **       Social Determinants of Health   Financial Resource Strain: Low Risk  (12/14/2022)   Overall Financial Resource Strain (CARDIA)    Difficulty of Paying Living Expenses: Not hard at all  Food Insecurity: No Food Insecurity (12/14/2022)   Hunger Vital Sign    Worried About Running Out of Food in the Last Year: Never true    Ran  Out of Food in the Last Year: Never true  Transportation Needs: No Transportation Needs (12/13/2022)   PRAPARE - Administrator, Civil Service (Medical): No    Lack of Transportation (Non-Medical): No  Physical Activity: Inactive (12/14/2022)   Exercise Vital Sign    Days of Exercise per Week: 0 days    Minutes of Exercise per Session: 0 min  Stress: Stress Concern Present (12/14/2022)   [employer] of Occupational Health - Occupational Stress Questionnaire    Feeling of Stress : To some extent  Social Connections: Moderately Integrated (12/14/2022)   Social Connection and Isolation Panel [NHANES]    Frequency of Communication with Friends and Family: More than three times a week    Frequency of Social Gatherings with Friends and Family: More than three times a week    Attends Religious Services: More than 4 times per year    Active Member of [employer] or Organizations: Yes    Attends Banker Meetings: Never    Marital Status: Divorced  Catering manager Violence: Not At Risk (12/14/2022)   Humiliation, Afraid, Rape, and Kick questionnaire    Fear of Current or Ex-Partner: No    Emotionally Abused: No    Physically Abused: No    Sexually Abused: No     Physical Exam   Vitals:   12/19/22 0120 12/19/22 0130  BP: (!) 96/57 (!) 93/50  Pulse: 83 83  Resp: 18 (!) 21  Temp:    SpO2: 100% 99%    CONSTITUTIONAL: Chronically ill-appearing, groaning in pain NEURO/PSYCH:  Alert and oriented x 3, no focal deficits EYES:  eyes equal and reactive ENT/NECK:  no LAD, no JVD CARDIO: Regular rate, well-perfused, normal S1 and S2 PULM:  CTAB no wheezing or rhonchi GI/GU:  non-distended, non-tender MSK/SPINE:  No gross deformities, no edema SKIN:  no rash, atraumatic   *Additional and/or pertinent findings included in MDM below  Diagnostic and Interventional Summary    EKG Interpretation Date/Time:  Sunday December 18 2022 23:32:17 EST Ventricular Rate:  93 PR  Interval:  193 QRS Duration:  89 QT Interval:  382 QTC Calculation: 476 R Axis:   241  Text Interpretation: Sinus rhythm LAD, consider left anterior fascicular block Anterolateral infarct, age indeterminate Confirmed by Kennis Carina [phone]) on 12/19/2022 2:09:24 AM       Labs Reviewed  COMPREHENSIVE METABOLIC PANEL - Abnormal; Notable for the following components:  Result Value   Glucose, Bld 125 (*)    BUN 29 (*)    Creatinine, Ser 2.05 (*)    Albumin 2.9 (*)    AST 14 (*)    Alkaline Phosphatase 128 (*)    GFR, Estimated 35 (*)    All other components within normal limits  CBC WITH DIFFERENTIAL/PLATELET - Abnormal; Notable for the following components:   WBC 24.7 (*)    RBC 3.89 (*)    Hemoglobin 7.9 (*)    HCT 29.6 (*)    MCV 76.1 (*)    MCH 20.3 (*)    MCHC 26.7 (*)    RDW 21.9 (*)    Platelets 610 (*)    nRBC 0.7 (*)    Neutro Abs 21.0 (*)    Monocytes Absolute 1.5 (*)    Abs Immature Granulocytes 0.29 (*)    All other components within normal limits  CBG MONITORING, ED - Abnormal; Notable for the following components:   Glucose-Capillary 122 (*)    All other components within normal limits  RESP PANEL BY RT-PCR (RSV, FLU A&B, COVID)  RVPGX2  CULTURE, BLOOD (ROUTINE X 2)  CULTURE, BLOOD (ROUTINE X 2)  LACTIC ACID, PLASMA  PROTIME-INR  APTT  CK  LACTIC ACID, PLASMA  URINALYSIS, W/ REFLEX TO CULTURE (INFECTION SUSPECTED)  TROPONIN I (HIGH SENSITIVITY)  TROPONIN I (HIGH SENSITIVITY)    DG Chest Port 1 View  Final Result    CT HEAD WO CONTRAST ( )  Final Result    CT Cervical Spine Wo Contrast  Final Result    CT CHEST ABDOMEN PELVIS WO CONTRAST  Final Result      Medications  fentaNYL (SUBLIMAZE) injection 25 mcg (25 mcg Intravenous Given 12/18/22 2339)  cefTRIAXone (ROCEPHIN) 2 g in sodium chloride 0.9 % 100 mL IVPB (0 g Intravenous Stopped 12/19/22 0208)  sodium chloride 0.9 % bolus 500 mL (0 mLs Intravenous Stopped 12/19/22 0208)      Procedures  /  Critical Care .Critical Care  Performed by: Sabas Sous, MD Authorized by: Sabas Sous, MD   Critical care provider statement:    Critical care time (minutes):  32   Critical care was necessary to treat or prevent imminent or life-threatening deterioration of the following conditions:  Sepsis   Critical care was time spent personally by me on the following activities:  Development of treatment plan with patient or surrogate, discussions with consultants, evaluation of patient's response to treatment, examination of patient, ordering and review of laboratory studies, ordering and review of radiographic studies, ordering and performing treatments and interventions, pulse oximetry, re-evaluation of patient's condition and review of old charts   ED Course and Medical Decision Making  Initial Impression and Ddx On my initial evaluation patient has soft/low blood pressure, heart rate greater than 90, groaning in pain.  Pain all over.  Possibly due to fall.  Differential diagnosis includes significant intrathoracic or intra-abdominal trauma causing bleeding, hypotension due to hypovolemia, other consideration includes sepsis.  Patient has a long history of comorbidities, including necrotizing fasciitis.  Also has history of CHF with an EF of only 20%.  Code sepsis initiated, starting judicious IV fluids, antibiotics.  Will need thorough advanced imaging.  Past medical/surgical history that increases complexity of ED encounter: CHF, diabetes  Interpretation of Diagnostics I personally reviewed the EKG and my interpretation is as follows: Sinus rhythm  Labs reveal prominent leukocytosis, acute kidney injury  Patient Reassessment and Ultimate Disposition/Management  CT scan reveals no intra-abdominal, intrathoracic, or intracranial emergencies.  Evidence of bilateral gluteal cleft abscesses with greatest diameter of 6 cm.  Given this source of infection with the soft  blood pressures, leukocytosis, will request hospitalist admission for sepsis.  Given the size and multitude of abscesses, suspect source control would be best obtained by general surgery, who can likely assess the patient in the morning.  Patient management required discussion with the following services or consulting groups:  Hospitalist Service  Complexity of Problems Addressed Acute illness or injury that poses threat of life of bodily function  Additional Data Reviewed and Analyzed Further history obtained from: Prior labs/imaging results  Additional Factors Impacting ED Encounter Risk Consideration of hospitalization  Elmer Sow. Pilar Plate, MD [facility] Emergency Medicine [facility] [email or web address]  Final Clinical Impressions(s) / ED Diagnoses     ICD-10-CM   1. Perianal abscess  K61.0     2. Hypotension, unspecified hypotension type  I95.9     3. Sepsis, due to unspecified organism, unspecified whether acute organ dysfunction present [facility])  A41.9       ED Discharge Orders     None        Discharge Instructions Discussed with and Provided to Patient:   Discharge Instructions   None      Sabas Sous, MD 12/19/22 0211

## 2022-12-18 NOTE — ED Triage Notes (Signed)
Pt states he fell into the shower and 2 abscesses to buttocks.

## 2022-12-19 DIAGNOSIS — E1122 Type 2 diabetes mellitus with diabetic chronic kidney disease: Secondary | ICD-10-CM | POA: Diagnosis present

## 2022-12-19 DIAGNOSIS — A419 Sepsis, unspecified organism: Secondary | ICD-10-CM | POA: Diagnosis not present

## 2022-12-19 DIAGNOSIS — I745 Embolism and thrombosis of iliac artery: Secondary | ICD-10-CM | POA: Diagnosis present

## 2022-12-19 DIAGNOSIS — W182XXA Fall in (into) shower or empty bathtub, initial encounter: Secondary | ICD-10-CM | POA: Diagnosis present

## 2022-12-19 DIAGNOSIS — F17200 Nicotine dependence, unspecified, uncomplicated: Secondary | ICD-10-CM | POA: Diagnosis not present

## 2022-12-19 DIAGNOSIS — G4733 Obstructive sleep apnea (adult) (pediatric): Secondary | ICD-10-CM | POA: Diagnosis present

## 2022-12-19 DIAGNOSIS — E669 Obesity, unspecified: Secondary | ICD-10-CM | POA: Diagnosis present

## 2022-12-19 DIAGNOSIS — Y93E1 Activity, personal bathing and showering: Secondary | ICD-10-CM | POA: Diagnosis not present

## 2022-12-19 DIAGNOSIS — K610 Anal abscess: Secondary | ICD-10-CM | POA: Diagnosis present

## 2022-12-19 DIAGNOSIS — F17210 Nicotine dependence, cigarettes, uncomplicated: Secondary | ICD-10-CM | POA: Diagnosis present

## 2022-12-19 DIAGNOSIS — E861 Hypovolemia: Secondary | ICD-10-CM | POA: Diagnosis present

## 2022-12-19 DIAGNOSIS — Z794 Long term (current) use of insulin: Secondary | ICD-10-CM | POA: Diagnosis not present

## 2022-12-19 DIAGNOSIS — D631 Anemia in chronic kidney disease: Secondary | ICD-10-CM | POA: Diagnosis present

## 2022-12-19 DIAGNOSIS — D329 Benign neoplasm of meninges, unspecified: Secondary | ICD-10-CM | POA: Diagnosis present

## 2022-12-19 DIAGNOSIS — D509 Iron deficiency anemia, unspecified: Secondary | ICD-10-CM | POA: Diagnosis present

## 2022-12-19 DIAGNOSIS — E1165 Type 2 diabetes mellitus with hyperglycemia: Secondary | ICD-10-CM | POA: Diagnosis present

## 2022-12-19 DIAGNOSIS — L0231 Cutaneous abscess of buttock: Secondary | ICD-10-CM | POA: Diagnosis present

## 2022-12-19 DIAGNOSIS — N1832 Chronic kidney disease, stage 3b: Secondary | ICD-10-CM | POA: Diagnosis present

## 2022-12-19 DIAGNOSIS — N179 Acute kidney failure, unspecified: Secondary | ICD-10-CM | POA: Diagnosis not present

## 2022-12-19 DIAGNOSIS — R6520 Severe sepsis without septic shock: Secondary | ICD-10-CM | POA: Diagnosis not present

## 2022-12-19 DIAGNOSIS — I5021 Acute systolic (congestive) heart failure: Secondary | ICD-10-CM | POA: Diagnosis not present

## 2022-12-19 DIAGNOSIS — E872 Acidosis, unspecified: Secondary | ICD-10-CM | POA: Diagnosis present

## 2022-12-19 DIAGNOSIS — I5042 Chronic combined systolic (congestive) and diastolic (congestive) heart failure: Secondary | ICD-10-CM | POA: Diagnosis present

## 2022-12-19 DIAGNOSIS — K219 Gastro-esophageal reflux disease without esophagitis: Secondary | ICD-10-CM | POA: Diagnosis present

## 2022-12-19 DIAGNOSIS — E1142 Type 2 diabetes mellitus with diabetic polyneuropathy: Secondary | ICD-10-CM | POA: Diagnosis present

## 2022-12-19 DIAGNOSIS — I9589 Other hypotension: Secondary | ICD-10-CM | POA: Diagnosis present

## 2022-12-19 DIAGNOSIS — E785 Hyperlipidemia, unspecified: Secondary | ICD-10-CM | POA: Diagnosis present

## 2022-12-19 DIAGNOSIS — Z1152 Encounter for screening for COVID-19: Secondary | ICD-10-CM | POA: Diagnosis not present

## 2022-12-19 DIAGNOSIS — I130 Hypertensive heart and chronic kidney disease with heart failure and stage 1 through stage 4 chronic kidney disease, or unspecified chronic kidney disease: Secondary | ICD-10-CM | POA: Diagnosis present

## 2022-12-19 DIAGNOSIS — E1149 Type 2 diabetes mellitus with other diabetic neurological complication: Secondary | ICD-10-CM | POA: Diagnosis not present

## 2022-12-19 DIAGNOSIS — D62 Acute posthemorrhagic anemia: Secondary | ICD-10-CM | POA: Diagnosis present

## 2022-12-19 LAB — CBC WITH DIFFERENTIAL/PLATELET
Basophils Relative: 0.1 10*3/uL (ref 0.0–0.1)
Eosinophils Absolute: 1 10*3/uL (ref 0.0–0.5)
Eosinophils Relative: 0.4 10*3/uL (ref 0.0–0.5)
HCT: 29.6 % — ABNORMAL LOW (ref 39.0–52.0)
Hemoglobin: 7.9 g/dL — ABNORMAL LOW (ref 13.0–17.0)
Immature Granulocytes: 1 10*3/uL (ref 0.0–0.1)
Lymphocytes Relative: 6 %
Lymphs Abs: 1.4 10*3/uL (ref 0.7–4.0)
MCH: 20.3 pg — ABNORMAL LOW (ref 26.0–34.0)
MCHC: 26.7 g/dL — ABNORMAL LOW (ref 30.0–36.0)
MCV: 76.1 fL — ABNORMAL LOW (ref 80.0–100.0)
Monocytes Absolute: 2 10*3/uL (ref 0.1–1.0)
Monocytes Relative: 1.5 10*3/uL — ABNORMAL HIGH (ref 0.1–1.0)
Monocytes Relative: 6 10*3/uL (ref 0.7–4.0)
Neutro Abs: 21 10*3/uL — ABNORMAL HIGH (ref 1.7–7.7)
Neutrophils Relative %: 84 %
Platelets: 610 10*3/uL — ABNORMAL HIGH (ref 150–400)
RBC: 3.89 MIL/uL — ABNORMAL LOW (ref 4.22–5.81)
RDW: 21.9 % — ABNORMAL HIGH (ref 11.5–15.5)
WBC Morphology: 0.29 10*3/uL — ABNORMAL HIGH (ref 0.00–0.07)
WBC: 24.7 10*3/uL — ABNORMAL HIGH (ref 4.0–10.5)
nRBC: 0.7 % — ABNORMAL HIGH (ref 0.0–0.2)

## 2022-12-19 LAB — URINALYSIS, W/ REFLEX TO CULTURE (INFECTION SUSPECTED)
Bacteria, UA: NONE SEEN
Bilirubin Urine: NEGATIVE
Glucose, UA: >=500 mg/dL — AB
Hgb urine dipstick: NEGATIVE
Ketones, ur: NEGATIVE mg/dL
Leukocytes,Ua: NEGATIVE
Nitrite: NEGATIVE
Protein, ur: 30 mg/dL — AB
Specific Gravity, Urine: 1.014 (ref 1.005–1.030)
pH: 6 (ref 5.0–8.0)

## 2022-12-19 LAB — RESP PANEL BY RT-PCR (RSV, FLU A&B, COVID)  RVPGX2
Influenza A by PCR: NEGATIVE
Influenza B by PCR: NEGATIVE
Resp Syncytial Virus by PCR: NEGATIVE
SARS Coronavirus 2 by RT PCR: NEGATIVE

## 2022-12-19 LAB — COMPREHENSIVE METABOLIC PANEL
ALT: 11 U/L (ref 0–44)
AST: 14 U/L — ABNORMAL LOW (ref 15–41)
Albumin: 2.9 g/dL — ABNORMAL LOW (ref 3.5–5.0)
Alkaline Phosphatase: 128 U/L — ABNORMAL HIGH (ref 38–126)
Anion gap: 10 (ref 5–15)
BUN: 29 mg/dL — ABNORMAL HIGH (ref 8–23)
CO2: 26 mmol/L (ref 22–32)
Calcium: 8.9 mg/dL (ref 8.9–10.3)
Chloride: 100 mmol/L (ref 98–111)
Creatinine, Ser: 2.05 mg/dL — ABNORMAL HIGH (ref 0.61–1.24)
GFR, Estimated: 35 mL/min — ABNORMAL LOW (ref >60)
Glucose, Bld: 125 mg/dL — ABNORMAL HIGH (ref 70–99)
Potassium: 4.8 mmol/L (ref 3.5–5.1)
Sodium: 136 mmol/L (ref 135–145)
Total Bilirubin: 0.7 mg/dL (ref <1.2)
Total Protein: 7.9 g/dL (ref 6.5–8.1)

## 2022-12-19 LAB — LACTIC ACID, PLASMA
Lactic Acid, Venous: 1.5 mmol/L (ref 0.5–1.9)
Lactic Acid, Venous: 1.9 mmol/L (ref 0.5–1.9)
Lactic Acid, Venous: 2.4 mmol/L (ref 0.5–1.9)
Lactic Acid, Venous: 2.6 mmol/L (ref 0.5–1.9)

## 2022-12-19 LAB — RAPID URINE DRUG SCREEN, HOSP PERFORMED
Amphetamines: NOT DETECTED
Barbiturates: NOT DETECTED
Benzodiazepines: NOT DETECTED
Cocaine: NOT DETECTED
Opiates: NOT DETECTED
Tetrahydrocannabinol: NOT DETECTED

## 2022-12-19 LAB — CBC
HCT: 29.6 % — ABNORMAL LOW (ref 39.0–52.0)
Hemoglobin: 7.8 g/dL — ABNORMAL LOW (ref 13.0–17.0)
MCH: 20.3 pg — ABNORMAL LOW (ref 26.0–34.0)
MCHC: 26.4 g/dL — ABNORMAL LOW (ref 30.0–36.0)
MCV: 76.9 fL — ABNORMAL LOW (ref 80.0–100.0)
Platelets: 604 10*3/uL — ABNORMAL HIGH (ref 150–400)
RBC: 3.85 MIL/uL — ABNORMAL LOW (ref 4.22–5.81)
RDW: 22 % — ABNORMAL HIGH (ref 11.5–15.5)
WBC: 24.2 10*3/uL — ABNORMAL HIGH (ref 4.0–10.5)
nRBC: 0.6 % — ABNORMAL HIGH (ref 0.0–0.2)

## 2022-12-19 LAB — BASIC METABOLIC PANEL
Anion gap: 11 (ref 5–15)
BUN: 28 mg/dL — ABNORMAL HIGH (ref 8–23)
CO2: 24 mmol/L (ref 22–32)
Calcium: 8.8 mg/dL — ABNORMAL LOW (ref 8.9–10.3)
Chloride: 101 mmol/L (ref 98–111)
Creatinine, Ser: 1.96 mg/dL — ABNORMAL HIGH (ref 0.61–1.24)
GFR, Estimated: 37 mL/min — ABNORMAL LOW (ref >60)
Glucose, Bld: 151 mg/dL — ABNORMAL HIGH (ref 70–99)
Potassium: 4.8 mmol/L (ref 3.5–5.1)
Sodium: 136 mmol/L (ref 135–145)

## 2022-12-19 LAB — PHOSPHORUS: Phosphorus: 3.8 mg/dL (ref 2.5–4.6)

## 2022-12-19 LAB — PROTIME-INR
INR: 1.2 (ref 0.8–1.2)
Prothrombin Time: 15 s (ref 11.4–15.2)

## 2022-12-19 LAB — MRSA NEXT GEN BY PCR, NASAL: MRSA by PCR Next Gen: NOT DETECTED

## 2022-12-19 LAB — MAGNESIUM: Magnesium: 2.6 mg/dL — ABNORMAL HIGH (ref 1.7–2.4)

## 2022-12-19 LAB — CK: Total CK: 88 U/L (ref 49–397)

## 2022-12-19 LAB — TROPONIN I (HIGH SENSITIVITY)
Troponin I (High Sensitivity): 13 ng/L (ref <18)
Troponin I (High Sensitivity): 14 ng/L (ref <18)

## 2022-12-19 LAB — APTT: aPTT: 32 s (ref 24–36)

## 2022-12-19 MED ORDER — FERROUS SULFATE 325 (65 FE) MG PO TABS
325.0000 mg | ORAL_TABLET | Freq: Every day | ORAL | Status: DC
  Administered 2022-12-19 – 2022-12-24 (×6): 325 mg via ORAL
  Filled 2022-12-19 (×6): qty 1

## 2022-12-19 MED ORDER — SODIUM CHLORIDE 0.9 % IV BOLUS
250.0000 mL | Freq: Once | INTRAVENOUS | Status: AC
  Administered 2022-12-19: 250 mL via INTRAVENOUS

## 2022-12-19 MED ORDER — PANTOPRAZOLE SODIUM 40 MG PO TBEC
40.0000 mg | DELAYED_RELEASE_TABLET | Freq: Every day | ORAL | Status: DC
  Administered 2022-12-19 – 2022-12-24 (×6): 40 mg via ORAL
  Filled 2022-12-19 (×6): qty 1

## 2022-12-19 MED ORDER — MELATONIN 3 MG PO TABS: 6.0000 mg | ORAL_TABLET | Freq: Every evening | ORAL | Status: DC | PRN

## 2022-12-19 MED ORDER — ACETAMINOPHEN 325 MG PO TABS: 650.0000 mg | ORAL_TABLET | Freq: Four times a day (QID) | ORAL | Status: DC | PRN

## 2022-12-19 MED ORDER — VANCOMYCIN HCL IN DEXTROSE 1-5 GM/200ML-% IV SOLN
1000.0000 mg | INTRAVENOUS | Status: DC
  Administered 2022-12-20 – 2022-12-23 (×4): 1000 mg via INTRAVENOUS
  Filled 2022-12-19 (×4): qty 200

## 2022-12-19 MED ORDER — ORAL CARE MOUTH RINSE: 15.0000 mL | OROMUCOSAL | Status: DC | PRN

## 2022-12-19 MED ORDER — FOLIC ACID 1 MG PO TABS
1.0000 mg | ORAL_TABLET | Freq: Every day | ORAL | Status: DC
  Administered 2022-12-19 – 2022-12-24 (×6): 1 mg via ORAL
  Filled 2022-12-19 (×6): qty 1

## 2022-12-19 MED ORDER — HEPARIN SODIUM (PORCINE) 5000 UNIT/ML IJ SOLN
5000.0000 [IU] | Freq: Three times a day (TID) | INTRAMUSCULAR | Status: DC
Start: 2022-12-19 — End: 2022-12-24
  Administered 2022-12-19 – 2022-12-24 (×16): 5000 [IU] via SUBCUTANEOUS
  Filled 2022-12-19 (×16): qty 1

## 2022-12-19 MED ORDER — VANCOMYCIN HCL IN DEXTROSE 1-5 GM/200ML-% IV SOLN
1000.0000 mg | Freq: Once | INTRAVENOUS | Status: AC
  Administered 2022-12-19: 1000 mg via INTRAVENOUS
  Filled 2022-12-19: qty 200

## 2022-12-19 MED ORDER — METRONIDAZOLE 500 MG/100ML IV SOLN
500.0000 mg | Freq: Two times a day (BID) | INTRAVENOUS | Status: DC
  Administered 2022-12-19 – 2022-12-23 (×9): 500 mg via INTRAVENOUS
  Filled 2022-12-19 (×9): qty 100

## 2022-12-19 MED ORDER — MELATONIN 5 MG PO TABS: 5.0000 mg | ORAL_TABLET | Freq: Every evening | ORAL | Status: DC | PRN

## 2022-12-19 MED ORDER — LINACLOTIDE 145 MCG PO CAPS
145.0000 ug | ORAL_CAPSULE | Freq: Every day | ORAL | Status: DC
  Administered 2022-12-19 – 2022-12-24 (×6): 145 ug via ORAL
  Filled 2022-12-19 (×6): qty 1

## 2022-12-19 MED ORDER — POLYETHYLENE GLYCOL 3350 17 G PO PACK: 17.0000 g | PACK | Freq: Every day | ORAL | Status: DC | PRN

## 2022-12-19 MED ORDER — MIDODRINE HCL 5 MG PO TABS
10.0000 mg | ORAL_TABLET | ORAL | Status: AC
  Administered 2022-12-19: 10 mg via ORAL
  Filled 2022-12-19: qty 2

## 2022-12-19 MED ORDER — OXYCODONE HCL 5 MG PO TABS
5.0000 mg | ORAL_TABLET | Freq: Four times a day (QID) | ORAL | Status: DC | PRN
  Administered 2022-12-22: 5 mg via ORAL
  Filled 2022-12-19 (×2): qty 1

## 2022-12-19 MED ORDER — GABAPENTIN 100 MG PO CAPS
100.0000 mg | ORAL_CAPSULE | Freq: Three times a day (TID) | ORAL | Status: DC
  Administered 2022-12-19 – 2022-12-24 (×16): 100 mg via ORAL
  Filled 2022-12-19 (×16): qty 1

## 2022-12-19 MED ORDER — SODIUM CHLORIDE 0.9 % IV SOLN
2.0000 g | INTRAVENOUS | Status: DC
Start: 2022-12-19 — End: 2022-12-24
  Administered 2022-12-19 – 2022-12-22 (×4): 2 g via INTRAVENOUS
  Filled 2022-12-19 (×5): qty 20

## 2022-12-19 MED ORDER — MIDODRINE HCL 5 MG PO TABS
5.0000 mg | ORAL_TABLET | Freq: Three times a day (TID) | ORAL | Status: DC
  Administered 2022-12-19 – 2022-12-21 (×8): 5 mg via ORAL
  Filled 2022-12-19 (×9): qty 1

## 2022-12-19 MED ORDER — PROCHLORPERAZINE EDISYLATE 10 MG/2ML IJ SOLN
5.0000 mg | Freq: Four times a day (QID) | INTRAMUSCULAR | Status: DC | PRN
  Administered 2022-12-21 – 2022-12-23 (×2): 5 mg via INTRAVENOUS
  Filled 2022-12-19 (×2): qty 2

## 2022-12-19 MED ORDER — HYDROMORPHONE HCL 1 MG/ML IJ SOLN: 0.5000 mg | INTRAMUSCULAR | Status: DC | PRN

## 2022-12-19 NOTE — Progress Notes (Signed)
   12/19/22 0424  Provider Notification  Provider Name/Title C. HALL  Date Provider Notified 12/19/22  Time Provider Notified 0424  Method of Notification Page  Notification Reason Other (Comment) (bp soft, no fluids, [facility] drug screen request. sepsis with elevated lactic and no fluids)  Provider response See new orders  Date of Provider Response 12/19/22  Time of Provider Response [phone]   Niece called and stated pt has also had blood in his stool. Pt details he's had recent weight loss but appetite has been okay. Denies any blood in stool or changes in bowel habit.

## 2022-12-19 NOTE — Sepsis Progress Note (Signed)
Notified provider of need to order fluid bolus for hypotension per sepsis protocol. However, he has an EF 20%. Holding off on more fluids for now. Will continue to monitor code sepsis.

## 2022-12-19 NOTE — H&P (Addendum)
History and Physical  [name] JYN:[id number] DOB: 07/20/1955 DOA: 12/18/2022  Referring physician: Dr. Pilar Plate, EDP  PCP: Benetta Spar, MD  Outpatient Specialists: Cardiology. Patient coming from: Home.  Chief Complaint: Fall  HPI: [name] is a 67 y.o. male with medical history significant for chronic combined diastolic and systolic CHF, hypertension, hyperlipidemia, type 2 diabetes, diabetic polyneuropathy, iron deficiency anemia, anemia of chronic disease, CKD 3B, obesity, polysubstance abuse including cocaine, who presented to the ED after a fall in the shower.  Endorses having pain all over.  Also complains of perianal pain.  In the ED, tachycardic with soft BPs, lab studies notable for leukocytosis 24.7K.  Perianal abscesses noted on exam and confirmed on noncontrast CT abdomen and pelvis.  Code sepsis was called in the ED.  The patient was started on empiric IV antibiotics, Rocephin and IV vancomycin.  Admitted by [facility], hospitalist service.  ED Course: Temperature 99.  BP 96/59, pulse 103, respiratory 17, saturation 99% on room air.  Labs today notable for WBC 24.7, hemoglobin 7.9, platelet count 610.  Creatinine 2.05 from baseline 1.76.  Review of Systems: Review of systems as noted in the HPI. All other systems reviewed and are negative.   Past Medical History:  Diagnosis Date   Bulging lumbar disc    CAD (coronary artery disease) [phone]   a. prior LAD stenting. b. s/p DES to [facility] 08/2015. c. 04/2016 Cardiac cath at [facility]. Patent stent in the PLAD and RCA. Diffuse dLAD, OM2, and  RPDA disease. d. DES to PDA and distal RCA 03/2019    Chronic lower back pain    CKD (chronic kidney disease), stage II    Cocaine abuse (HCC)    Cyst of epididymis    DM2 (diabetes mellitus, type 2) (HCC)    Essential hypertension    Fournier gangrene    GERD (gastroesophageal reflux disease)    Headache    History of pneumonia    Hyperlipidemia    Ischemic  cardiomyopathy    LV (left ventricular) mural thrombus    Sleep apnea    Past Surgical History:  Procedure Laterality Date   APPENDECTOMY     BIOPSY  11/11/2022   Procedure: BIOPSY;  Surgeon: Franky Macho, MD;  Location: [facility];  Service: Endoscopy;;   BRONCHIAL NEEDLE ASPIRATION BIOPSY  11/12/2021   Procedure: BRONCHIAL NEEDLE ASPIRATION BIOPSIES;  Surgeon: Omar Person, MD;  Location: [facility] ENDOSCOPY;  Service: Pulmonary;;   CARDIAC CATHETERIZATION N/A 09/07/2015   Procedure: Left Heart Cath and Coronary Angiography;  Surgeon: Marykay Lex, MD;  Location: [facility] INVASIVE CV LAB;  Service: Cardiovascular;  Laterality: N/A;   CARDIAC CATHETERIZATION N/A 09/07/2015   Procedure: Coronary Stent Intervention;  Surgeon: Marykay Lex, MD;  Location: [facility] INVASIVE CV LAB;  Service: Cardiovascular;  Laterality: N/A;   COLONOSCOPY WITH PROPOFOL N/A 11/11/2022   Procedure: COLONOSCOPY WITH PROPOFOL;  Surgeon: Franky Macho, MD;  Location: [facility];  Service: Endoscopy;  Laterality: N/A;   CORONARY ANGIOGRAM  09/07/13   residual RCA and OM disease   CORONARY ANGIOPLASTY WITH STENT PLACEMENT     CORONARY STENT INTERVENTION N/A 10/16/2018   Procedure: CORONARY STENT INTERVENTION;  Surgeon: Kathleene Hazel, MD;  Location: [facility] INVASIVE CV LAB;  Service: Cardiovascular;  Laterality: N/A;   CORONARY STENT INTERVENTION N/A 03/11/2019   Procedure: CORONARY STENT INTERVENTION;  Surgeon: Corky Crafts, MD;  Location: [facility] INVASIVE CV LAB;  Service: Cardiovascular;  Laterality: N/A;   ESOPHAGOGASTRODUODENOSCOPY (EGD) WITH PROPOFOL N/A 11/11/2022   Procedure: ESOPHAGOGASTRODUODENOSCOPY (EGD) WITH PROPOFOL;  Surgeon: Franky Macho, MD;  Location: [facility];  Service: Endoscopy;  Laterality: N/A;   FRACTURE SURGERY     HEMOSTASIS CLIP PLACEMENT  11/11/2022   Procedure: HEMOSTASIS CLIP PLACEMENT;  Surgeon: Franky Macho, MD;  Location: [facility];  Service: Endoscopy;;    INCISION AND DRAINAGE OF WOUND Left 05/19/2019   Procedure: DEBRIDEMENT LEFT GROIN;  Surgeon: Violeta Gelinas, MD;  Location: [facility] OR;  Service: General;  Laterality: Left;   INSERTION OF ILIAC STENT Left 11/30/2017   Left external illiac stent   INSERTION OF ILIAC STENT  11/30/2017   Procedure: Insertion Of Iliac Stent;  Surgeon: Runell Gess, MD;  Location: [facility] INVASIVE CV LAB;  Service: Cardiovascular;;  Left external illiac stent   KNEE ARTHROSCOPY Left    KNEE SURGERY     "ligaments, cartilage; tendon, put a pin in" (11/30/2017)   LEFT HEART CATH Bilateral 07/08/2012   Procedure: LEFT HEART CATH;  Surgeon: Corky Crafts, MD;  Location: [facility] CATH LAB;  Service: Cardiovascular;  Laterality: Bilateral;   LEFT HEART CATH AND CORONARY ANGIOGRAPHY N/A 10/16/2018   Procedure: LEFT HEART CATH AND CORONARY ANGIOGRAPHY;  Surgeon: Kathleene Hazel, MD;  Location: [facility] INVASIVE CV LAB;  Service: Cardiovascular;  Laterality: N/A;   LEFT HEART CATH AND CORONARY ANGIOGRAPHY N/A 03/11/2019   Procedure: LEFT HEART CATH AND CORONARY ANGIOGRAPHY;  Surgeon: Corky Crafts, MD;  Location: [facility] INVASIVE CV LAB;  Service: Cardiovascular;  Laterality: N/A;   LEFT HEART CATHETERIZATION WITH CORONARY ANGIOGRAM N/A 09/06/2013   STEMI, 2nd ISR LAD. Procedure: LEFT HEART CATHETERIZATION WITH CORONARY ANGIOGRAM;  Surgeon: Corky Crafts, MD;  Location: [facility] CATH LAB;  Service: Cardiovascular;  Laterality: N/A;   LOWER EXTREMITY ANGIOGRAPHY N/A 11/30/2017   Procedure: LOWER EXTREMITY ANGIOGRAPHY;  Surgeon: Runell Gess, MD;  Location: [facility] INVASIVE CV LAB;  Service: Cardiovascular;  Laterality: N/A;   PERCUTANEOUS CORONARY STENT INTERVENTION (PCI-S)  07/08/2012   Procedure: PERCUTANEOUS CORONARY STENT INTERVENTION (PCI-S);  Surgeon: Corky Crafts, MD;  Location: [facility] CATH LAB;  Service: Cardiovascular;;  DES LAD   PERCUTANEOUS CORONARY STENT INTERVENTION (PCI-S) N/A 09/06/2013   Procedure: PERCUTANEOUS  CORONARY STENT INTERVENTION (PCI-S);  Surgeon: Corky Crafts, MD;  Location: [facility] CATH LAB;  Service: Cardiovascular;  Laterality: N/A;  Mid LAD 3.0/24mm Promus   POLYPECTOMY  11/11/2022   Procedure: POLYPECTOMY INTESTINAL;  Surgeon: Franky Macho, MD;  Location[facility];  Service: Endoscopy;;   SCLEROTHERAPY  11/11/2022   Procedure: Susa Day;  Surgeon: Franky Macho, MD;  [facility];  Service: Endoscopy;;   SUBMUCOSAL TATTOO INJECTION  11/11/2022   Procedure: SUBMUCOSAL TATTOO INJECTION;  Surgeon: Franky Macho, MD;  Location: [facility];  Service: Endoscopy;;   THORACENTESIS Right 11/10/2021   Procedure: Alanson Puls;  Surgeon: Leslye Peer, MD;  Location: [facility] ENDOSCOPY;  Service: Cardiopulmonary;  Laterality: Right;   VIDEO BRONCHOSCOPY WITH ENDOBRONCHIAL ULTRASOUND Right 11/12/2021   Procedure: VIDEO BRONCHOSCOPY WITH ENDOBRONCHIAL ULTRASOUND;  Surgeon: Omar Person, MD;  Location: [facility] ENDOSCOPY;  Service: Pulmonary;  Laterality: Right;   WOUND DEBRIDEMENT Left 05/20/2019   Procedure: DEBRIDEMENT GROIN;  Surgeon: Kinsinger, De Blanch, MD;  Location: [facility] OR;  Service: General;  Laterality: Left;   WRIST FRACTURE SURGERY Bilateral     Social History:  reports that he has been smoking cigarettes. He has a  24 pack-year smoking history. He has been exposed to tobacco smoke. He has never used smokeless tobacco. He reports that he does not currently use alcohol. He reports that he does not currently use drugs after having used the following drugs: Cocaine.   Allergies  Allergen Reactions   Clopidogrel Other (See Comments)    Drowsy, Skin irritation    Family History  Problem Relation Age of Onset   Hypertension Mother    Diabetes Mother       Prior to Admission medications   Medication Sig Start Date End Date Taking? Authorizing Provider  aspirin EC (ASPIRIN LOW DOSE) 81 MG tablet Take 1 tablet (81 mg total) by mouth daily. Swallow whole.  11/14/22   Catarina Hartshorn, MD  atorvastatin (LIPITOR) 40 MG tablet Take 1 tablet (40 mg total) by mouth at bedtime. Patient not taking: Reported on 11/02/2022 10/25/22 11/24/22  Vassie Loll, MD  carvedilol (COREG) 3.125 MG tablet Take 1 tablet (3.125 mg total) by mouth 2 (two) times daily with a meal. 10/25/22 12/12/22  Vassie Loll, MD  ferrous sulfate 325 (65 FE) MG tablet Take 1 tablet (325 mg total) by mouth daily with breakfast. 11/14/22   Tat, Onalee Hua, MD  folic acid (FOLVITE) 1 MG tablet Take 1 tablet (1 mg total) by mouth daily. 11/14/22   Catarina Hartshorn, MD  furosemide (LASIX) 40 MG tablet Take 40 mg by mouth daily. 12/05/22   [provider]  gabapentin (NEURONTIN) 300 MG capsule Take 1 capsule (300 mg total) by mouth 3 (three) times daily. 10/25/22   Vassie Loll, MD  insulin glargine (LANTUS) 100 UNIT/ML Solostar Pen Inject 5 Units into the skin daily. 11/15/22   Catarina Hartshorn, MD  Insulin Pen Needle 31G X 5 MM MISC Use with insulin pen to dispense insulin as directed 11/15/22   Tat, Onalee Hua, MD  JARDIANCE 10 MG TABS tablet Take 10 mg by mouth daily. 12/05/22   [provider]  linaclotide Karlene Einstein) 145 MCG CAPS capsule Take 1 capsule (145 mcg total) by mouth daily before breakfast. 11/14/22   Tat, Onalee Hua, MD  nicotine (NICODERM CQ - DOSED IN MG/24 HOURS) 21 mg/24hr patch Place 1 patch (21 mg total) onto the skin daily. Patient not taking: Reported on 12/12/2022 10/26/22   Vassie Loll, MD  pantoprazole (PROTONIX) 40 MG tablet Take 1 tablet (40 mg total) by mouth daily. 10/26/22   Vassie Loll, MD  predniSONE (DELTASONE) 50 MG tablet Take 1 tablet (50 mg total) by mouth daily. 12/05/22   Dione Booze, MD  torsemide (DEMADEX) 20 MG tablet Take 1 tablet (20 mg total) by mouth daily. 11/13/22   Catarina Hartshorn, MD  traMADol (ULTRAM) 50 MG tablet Take 1 tablet (50 mg total) by mouth every 6 (six) hours as needed. 12/05/22   Dione Booze, MD    Physical Exam: BP 96/72   Pulse 82   Temp  98.9 F (37.2 C) (Oral)   Resp 13   Ht 5\' 9"  (1.753 m)   Wt 111 kg   SpO2 97%   BMI 36.14 kg/m   General: 67 y.o. year-old male well developed well nourished in no acute distress.  Alert and oriented x3. Cardiovascular: Regular rate and rhythm with no rubs or gallops.  No thyromegaly or JVD noted.  No lower extremity edema. 2/4 pulses in all 4 extremities. Respiratory: Clear to auscultation with no wheezes or rales. Good inspiratory effort. Abdomen: Soft nontender nondistended with normal bowel sounds x4 quadrants. Muskuloskeletal: No cyanosis, clubbing  or edema noted bilaterally Neuro: CN II-XII intact, strength, sensation, reflexes Skin: Perianal abscesses, very tender. Psychiatry: Judgement and insight appear normal. Mood is appropriate for condition and setting          Labs on Admission:  Basic Metabolic Panel: Recent Labs  Lab 12/18/22 2344  NA 136  K 4.8  CL 100  CO2 26  GLUCOSE 125*  BUN 29*  CREATININE 2.05*  CALCIUM 8.9   Liver Function Tests: Recent Labs  Lab 12/18/22 2344  AST 14*  ALT 11  ALKPHOS 128*  BILITOT 0.7  PROT 7.9  ALBUMIN 2.9*   No results for input(s): "LIPASE", "AMYLASE" in the last 168 hours. No results for input(s): "AMMONIA" in the last 168 hours. CBC: Recent Labs  Lab 12/18/22 2344  WBC 24.7*  NEUTROABS 21.0*  HGB 7.9*  HCT 29.6*  MCV 76.1*  PLT 610*   Cardiac Enzymes: Recent Labs  Lab 12/18/22 2344  CKTOTAL 88    BNP (last 3 results) Recent Labs    11/03/22 0428 11/08/22 0448 11/28/22 1616  BNP 1,220.0* 1,205.0* 304.0*    ProBNP (last 3 results) No results for input(s): "PROBNP" in the last 8760 hours.  CBG: Recent Labs  Lab 12/18/22 2327  GLUCAP 122*    Radiological Exams on Admission: CT CHEST ABDOMEN PELVIS WO CONTRAST  Result Date: 12/19/2022 CLINICAL DATA:  Fall, shortness of breath, buttock abscesses EXAM: CT CHEST, ABDOMEN AND PELVIS WITHOUT CONTRAST TECHNIQUE: Multidetector CT imaging of the  chest, abdomen and pelvis was performed following the standard protocol without IV contrast. RADIATION DOSE REDUCTION: This exam was performed according to the departmental dose-optimization program which includes automated exposure control, adjustment of the mA and/or kV according to patient size and/or use of iterative reconstruction technique. COMPARISON:  CTA abdomen/pelvis dated 11/02/2022 FINDINGS: CT CHEST FINDINGS Cardiovascular: Cardiomegaly. No pericardial effusion. Hypodense blood pool relative to myocardium, suggesting anemia. No evidence of thoracic aortic aneurysm. Atherosclerotic calcifications of the arch. Severe three-vessel coronary atherosclerosis. Mediastinum/Nodes: Small mediastinal nodes, measuring up to 12 mm short axis in the right paratracheal region (series 2/image 14), previously 17 mm, likely reactive. Visualized thyroid is unremarkable. Lungs/Pleura: Mosaic attenuation with mild scarring/atelectasis in the lungs bilaterally. No focal consolidation. No suspicious pulmonary nodules. No pleural effusion or pneumothorax. Musculoskeletal: Degenerative changes of the thoracic spine. Sternum, clavicles, and scapulae are intact. Bilateral ribs are intact. CT ABDOMEN PELVIS FINDINGS Hepatobiliary: Unenhanced liver is unremarkable. Layering tiny gallstones (series 2/image 58), without associated inflammatory changes. No intrahepatic or extrahepatic ductal dilatation. Pancreas: Within normal limits. Spleen: Within normal limits. Adrenals/Urinary Tract: Adrenal glands are within normal limits. Kidneys are within normal limits.  No hydronephrosis. Bladder is within normal limits. Stomach/Bowel: Stomach is within normal limits. No evidence of bowel obstruction. Normal appendix/appendiceal stump (series 2/image 84). No colonic wall thickening or inflammatory changes. Vascular/Lymphatic: No evidence of abdominal aortic aneurysm. Atherosclerotic calcifications of the abdominal aorta and branch vessels.  Left external iliac stent. No suspicious abdominopelvic lymphadenopathy. Reproductive: Prostate is unremarkable. Other: No abdominopelvic ascites. Musculoskeletal: 3.1 x 2.8 cm fluid collection with trace nondependent gas along the left medial gluteal cleft (series 2/image 122). 6.5 x 3.4 cm fluid collection inferiorly along the right medial gluteal cleft (series 2/image 135). These likely correspond to the patient's known buttock abscesses. Mild degenerative changes of the lumbar spine.  No fracture is seen. IMPRESSION: No traumatic injury to the chest, abdomen, or pelvis. Bilateral buttock abscesses, right greater than left. Additional ancillary findings as above. Electronically  Signed   By: Charline Bills M.D.   On: 12/19/2022 00:19   DG Chest Port 1 View  Result Date: 12/19/2022 CLINICAL DATA:  Questionable sepsis - evaluate for abnormality EXAM: PORTABLE CHEST 1 VIEW COMPARISON:  11/28/2022 FINDINGS: Cardiomegaly. Vascular congestion. Low lung volumes with bibasilar atelectasis. No overt edema or effusions. No acute bony abnormality. IMPRESSION: Cardiomegaly, vascular congestion. Low lung volumes with bibasilar atelectasis. Electronically Signed   By: Charlett Nose M.D.   On: 12/19/2022 00:19   CT Cervical Spine Wo Contrast  Result Date: 12/19/2022 CLINICAL DATA:  Fall.  Neck trauma (Age >= 65y) EXAM: CT CERVICAL SPINE WITHOUT CONTRAST TECHNIQUE: Multidetector CT imaging of the cervical spine was performed without intravenous contrast. Multiplanar CT image reconstructions were also generated. RADIATION DOSE REDUCTION: This exam was performed according to the departmental dose-optimization program which includes automated exposure control, adjustment of the mA and/or kV according to patient size and/or use of iterative reconstruction technique. COMPARISON:  11/07/2021 FINDINGS: Alignment: Normal Skull base and vertebrae: No acute fracture. No primary bone lesion or focal pathologic process. Soft  tissues and spinal canal: No prevertebral fluid or swelling. No visible canal hematoma. Disc levels: Mild diffuse degenerative disc disease and facet disease. Upper chest: No acute findings Other: None IMPRESSION: No acute bony abnormality. Electronically Signed   By: Charlett Nose M.D.   On: 12/19/2022 00:14   CT HEAD WO CONTRAST ( )  Result Date: 12/19/2022 CLINICAL DATA:  Fall.  Head trauma, minor (Age >= 65y) EXAM: CT HEAD WITHOUT CONTRAST TECHNIQUE: Contiguous axial images were obtained from the base of the skull through the vertex without intravenous contrast. RADIATION DOSE REDUCTION: This exam was performed according to the departmental dose-optimization program which includes automated exposure control, adjustment of the mA and/or kV according to patient size and/or use of iterative reconstruction technique. COMPARISON:  11/28/2022, MRI 11/06/2022 FINDINGS: Brain: There is atrophy and chronic small vessel disease changes. Low-density/edema again noted in the right temporal lobe, unchanged. 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 No acute infarct, hemorrhage or hydrocephalus. Vascular: No hyperdense vessel or unexpected calcification. Skull: No acute calvarial abnormality. Sinuses/Orbits: No acute findings Other: None IMPRESSION: Stable edema within the right temporal lobe, possibly related to known right middle cranial fossa meningioma, not seen on this study. Old infarcts as above. Atrophy, chronic microvascular disease. No acute intracranial abnormality. Electronically Signed   By: Charlett Nose M.D.   On: 12/19/2022 00:13    EKG: I independently viewed the EKG done and my findings are as followed: Sinus rhythm rate of 93.  Nonspecific ST-T changes.  QTc 476.  Assessment/Plan Present on Admission:  Sepsis (HCC)  Principal Problem:   Sepsis (HCC)  Sepsis secondary to perianal abscesses seen on CT scan, POA Follow peripheral blood cultures x 2 Continue  empiric IV antibiotics Rocephin and IV vancomycin Consult general surgery in the morning. N.p.o. until seen by general surgery. Monitor fever curve and WBCs As needed analgesics  Chronic combined diastolic and systolic CHF with LVEF 20 to 25% Last 2D echo done on 10/21/2022 revealed LVEF 20 to 25% with grade 2 diastolic dysfunction Monitor strict I's and O's and daily weight  AKI on CKD 3B suspect prerenal in the setting of dehydration from poor oral intake. Baseline creatinine appears to be 1.7 with GFR 42 Presented with creatinine of 2.05 with GFR 36 Avoid nephrotoxic agents, and hypotension. Monitor urine output Repeat renal function test in the morning.  History  of hypertension, BPs are currently soft Transient hypotension secondary to sepsis and hypovolemia Received judicious IV fluid in the ED in the setting of HFrEF 20-25% Closely monitor vital signs Maintain MAP greater than 65  Type 2 diabetes with hyperglycemia Last hemoglobin A1c 6.3 on 10/21/2022 N.p.o. until seen by general surgery  Diabetic polyneuropathy Resume home regimen.  Isolated mildly elevated alkaline phosphatase Alkaline phosphatase 128 Monitor for now.  Obesity BMI 36 Recommend weight loss outpatient with regular physical activity and healthy dieting.  Anemia of chronic disease in the setting of CKD 3B Iron deficiency anemia Hemoglobin 7.9 Baseline hemoglobin 10 No reported overt bleeding Resume home iron supplement  Thrombocytosis Suspect reactive in the setting of sepsis Platelet count 610 Monitor for now  Polysubstance abuse including cocaine UDS done on 11/02/2022 positive for cocaine  Generalized weakness with fall PT OT assessment Fall precautions   Critical care time: 65 minutes.   DVT prophylaxis: Subcu heparin 3 times daily  Code Status: Full code  Family Communication: None at bedside  Disposition Plan: Admitted to telemetry unit  Consults called: None.  Consult  general surgery in the morning.  Admission status: Inpatient status.   Status is: Inpatient The patient requires at least 2 midnights for further evaluation and treatment of present condition.   Darlin Drop MD [facility] Pager [phone]  If 7PM-7AM, please contact night-coverage [email or web address] Password [email or web address]  12/19/2022, 3:22 AM

## 2022-12-19 NOTE — Progress Notes (Signed)
Date and time results received: 12/19/22 0817 (use smartphrase ".now" to insert current time)  Test: blood, lactic acid Critical Value: 2.6  Name of Provider Notified: Dr. Gwenlyn Perking  Orders Received? Or Actions Taken?:

## 2022-12-19 NOTE — Progress Notes (Signed)
Pharmacy Antibiotic Note  [name] is a 67 y.o. male admitted on 12/18/2022 with sepsis.  Pharmacy has been consulted for vancomycin dosing.  Patient currently afebrile, wbc elevated at 24. Scr also elevated at 1.9. Lactic acid 2.6  Vancomycin 1000 mg IV Q 24 hrs. Goal AUC 400-550. Expected AUC: 510 SCr used: 1.9  Continue ceftriaxone   Height: 5\' 9"  (175.3 cm) Weight: 111 kg (244 lb 11.4 oz) IBW/kg (Calculated) : 70.7  Temp (24hrs), Avg:98 F (36.7 C), Min:97.4 F (36.3 C), Max:98.9 F (37.2 C)  Recent Labs  Lab 12/18/22 2344 12/19/22 0141 12/19/22 0437 12/19/22 0441 12/19/22 0746  WBC 24.7*  --  24.2*  --   --   CREATININE 2.05*  --  1.96*  --   --   LATICACIDVEN 1.5 2.4*  --  1.9 2.6*    Estimated Creatinine Clearance: 44.3 mL/min (A) (by C-G formula based on SCr of 1.96 mg/dL (H)).    Allergies  Allergen Reactions   Clopidogrel Other (See Comments)    Drowsy, Skin irritation    Thank you for allowing pharmacy to be a part of this patient's care.  Sheppard Coil PharmD., BCPS Clinical Pharmacist 12/19/2022 8:57 AM

## 2022-12-19 NOTE — TOC Initial Note (Signed)
Transition of Care [facility]) - Initial/Assessment Note    Patient Details  Name: [name] MRN: [id number] Date of Birth: [birth year]  Transition of Care [facility]) CM/SW Contact:    Elliot Gault, LCSW Phone Number: 12/19/2022, 11:18 AM  Clinical Narrative:                  Pt admitted from home. He is high risk for readmission. Pt well known to TOC from previous admission. Pt uses the [facility] for care.   Met with pt at bedside to assess. Pt resides alone. He has an Aide through his [place] benefits. Pt reports riding the bus for transportation. His aide picks up medications from his pharmacy.   Pt has had HH from [facility] in the past though they are not currently active.   [facility] notification of admission completed. Reference number is [id number].  TOC will follow and assist as needed.  Expected Discharge Plan: Home w Home Health Services Barriers to Discharge: Continued Medical Work up   Patient Goals and CMS Choice Patient states their goals for this hospitalization and ongoing recovery are:: return home CMS Medicare.gov Compare Post Acute Care list provided to:: Patient Choice offered to / list presented to : Patient      Expected Discharge Plan and Services In-house Referral: Clinical Social Work   Post Acute Care Choice: Resumption of Svcs/PTA Provider Living arrangements for the past 2 months: Single Family Home                                      Prior Living Arrangements/Services Living arrangements for the past 2 months: Single Family Home Lives with:: Self Patient language and need for interpreter reviewed:: Yes Do you feel safe going back to the place where you live?: Yes      Need for Family Participation in Patient Care: No (Comment)   Current home services: DME Criminal Activity/Legal Involvement Pertinent to Current Situation/Hospitalization: No - Comment as needed  Activities of Daily Living   ADL Screening (condition at time of  admission) Independently performs ADLs?: Yes (appropriate for developmental age) Is the patient deaf or have difficulty hearing?: No Does the patient have difficulty seeing, even when wearing glasses/contacts?: No Does the patient have difficulty concentrating, remembering, or making decisions?: Yes  Permission Sought/Granted                  Emotional Assessment Appearance:: Appears stated age Attitude/Demeanor/Rapport: Engaged Affect (typically observed): Accepting Orientation: : Oriented to Self, Oriented to Place, Oriented to  Time, Oriented to Situation Alcohol / Substance Use: Not Applicable Psych Involvement: No (comment)  Admission diagnosis:  Perianal abscess [K61.0] Sepsis (HCC) [A41.9] Hypotension, unspecified hypotension type [I95.9] Sepsis, due to unspecified organism, unspecified whether acute organ dysfunction present [facility]) [A41.9] Patient Active Problem List   Diagnosis Date Noted   Sepsis (HCC) 12/19/2022   Gastritis and gastroduodenitis 11/11/2022   Adenomatous polyp of transverse colon 11/11/2022   Acute anemia 11/09/2022   H/O medication noncompliance 11/09/2022   Acute on chronic HFrEF (heart failure with reduced ejection fraction) (HCC) 11/02/2022   Pleural effusion 11/10/2021   Abnormal CT of the chest 11/09/2021   Mediastinal lymphadenopathy 11/09/2021   Acute respiratory failure with hypoxia (HCC) 11/07/2021   Stroke (cerebrum) (HCC) 11/07/2021   Elevated troponin 11/07/2021   Acute metabolic encephalopathy 11/07/2021   Protein calorie malnutrition (HCC) 11/07/2021   Cardiac volume  overload 11/07/2021   Acute systolic heart failure (HCC)    Scrotal pain; chronic, bilateral 07/05/2021   Microscopic hematuria 07/05/2021   Organic impotence 07/05/2021   Necrotizing fasciitis (HCC) 05/22/2019   Opiate overdose (HCC)    Uncontrolled type 2 diabetes mellitus with hyperglycemia (HCC) 05/16/2019   Elevated lactic acid level    Acute renal failure  superimposed on stage 3b chronic kidney disease (HCC) 05/15/2019   Cocaine abuse (HCC) 05/15/2019   Severe sepsis (HCC) 05/14/2019   Unstable angina (HCC) 10/16/2018   Apical mural thrombus 10/15/2018   Claudication in peripheral vascular disease (HCC) 11/30/2017   PAD (peripheral artery disease) (HCC) 11/28/2017   Chest pain 11/03/2016   Constipation 11/03/2016   CKD (chronic kidney disease), stage II 08/02/2016   Cellulitis of right hand 08/02/2016   Chronic pain 08/02/2016   AKI (acute kidney injury) (HCC) 01/31/2016   Lobar pneumonia (HCC) 01/31/2016   Influenza A 01/31/2016   Diarrhea 01/30/2016   NSVT (nonsustained ventricular tachycardia) (HCC) 09/12/2015   Precordial chest pain 09/10/2015   Cocaine use 09/04/2015   Near syncope 06/29/2015   Essential hypertension 06/29/2015   Status post coronary artery stent placement 06/29/2015   Insulin dependent diabetes mellitus 06/29/2015   History of MI (myocardial infarction) 06/29/2015   Acute on chronic systolic CHF (congestive heart failure) (HCC) 06/29/2015   Musculoskeletal chest pain 05/27/2015   OSA on CPAP 01/21/2015   Rectal bleeding    Stage 3 chronic kidney disease (HCC) 09/09/2013   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 09/09/2013   Coronary artery disease 09/09/2013   Ischemic cardiomyopathy 09/09/2013   S/P LAD DES June 2014 09/06/2013   Tobacco use disorder 09/06/2013   Hyperglycemia 08/22/2012   NSTEMI (non-ST elevated myocardial infarction) (HCC) 07/07/2012   Dyslipidemia 07/07/2012   Hypertensive heart disease    PCP:  Benetta Spar, MD Pharmacy:   [facility] [place], [place] - [address] [place] [address] Phone: [phone] Fax: [phone]  [facility] [place], [place] - [address] [place] [address] Phone: [phone] Fax: [phone]  [facility] - [place], [place] - [address] [place]  [place] [address] Phone: [phone] Fax: [phone]     Social Determinants of Health (SDOH) Social History: SDOH Screenings   Food Insecurity: No Food Insecurity (12/19/2022)  Housing: Low Risk  (12/19/2022)  Transportation Needs: No Transportation Needs (12/19/2022)  Utilities: Not At Risk (12/19/2022)  Alcohol Screen: Low Risk  (12/14/2022)  Depression (PHQ2-9): Low Risk  (12/14/2022)  Financial Resource Strain: Low Risk  (12/14/2022)  Physical Activity: Inactive (12/14/2022)  Social Connections: Moderately Integrated (12/14/2022)  Stress: Stress Concern Present (12/14/2022)  Tobacco Use: High Risk (12/18/2022)  Health Literacy: Adequate Health Literacy (12/14/2022)   SDOH Interventions:     Readmission Risk Interventions    12/19/2022   10:56 AM 11/04/2022   10:22 AM 10/21/2022    1:05 PM  Readmission Risk Prevention Plan  Transportation Screening Complete Complete Complete  HRI or Home Care Consult  Complete Complete  Social Work Consult for Recovery Care Planning/Counseling  Complete Complete  Palliative Care Screening  Not Applicable Not Applicable  Medication Review Oceanographer) Complete Complete Complete  HRI or Home Care Consult Complete    SW Recovery Care/Counseling Consult Complete    Palliative Care Screening Not Applicable    Skilled Nursing Facility Not Applicable

## 2022-12-19 NOTE — Progress Notes (Signed)
ED Pharmacy Antibiotic Sign Off An antibiotic consult was received from an ED provider for vancomycin per pharmacy dosing for sepsis. A chart review was completed to assess appropriateness.   The following one time order(s) were placed:   -Ceftriaxone 2g IV x1 per MD -Vancomycin 2g IV x1  Further antibiotic and/or antibiotic pharmacy consults should be ordered by the admitting provider if indicated.   Thank you for allowing pharmacy to be a part of this patient's care.   Arabella Merles, [facility]  Clinical Pharmacist 12/19/22 2:21 AM

## 2022-12-19 NOTE — Evaluation (Signed)
Physical Therapy Evaluation Patient Details Name: [name] MRN: [id number] DOB: April 10, 1955 Today's Date: 12/19/2022  History of Present Illness  [name] is a 67 y.o. male with medical history significant for chronic combined diastolic and systolic CHF, hypertension, hyperlipidemia, type 2 diabetes, diabetic polyneuropathy, iron deficiency anemia, anemia of chronic disease, CKD 3B, obesity, polysubstance abuse including cocaine, who presented to the ED after a fall in the shower.  Endorses having pain all over.  Also complains of perianal pain.   Clinical Impression  Patient requires increased time for sitting up at bedside, has to lean on armrest of chair during transfers without AD, safer using RW and demonstrated slow labored cadence for ambulating in room/hallway without loss of balance.  Patient tolerated sitting up in chair after therapy.  Patient will benefit from continued skilled physical therapy in hospital and recommended venue below to increase strength, balance, endurance for safe ADLs and gait.          If plan is discharge home, recommend the following: A little help with walking and/or transfers;A little help with bathing/dressing/bathroom;Help with stairs or ramp for entrance;Assistance with cooking/housework   Can travel by private vehicle        Equipment Recommendations None recommended by PT  Recommendations for Other Services       Functional Status Assessment Patient has had a recent decline in their functional status and demonstrates the ability to make significant improvements in function in a reasonable and predictable amount of time.     Precautions / Restrictions Precautions Precautions: Fall Restrictions Weight Bearing Restrictions: No      Mobility  Bed Mobility Overal bed mobility: Needs Assistance Bed Mobility: Supine to Sit     Supine to sit: Contact guard     General bed mobility comments: increased time, labored movement     Transfers Overall transfer level: Needs assistance Equipment used: Rolling walker (2 wheels), None Transfers: Sit to/from Stand, Bed to chair/wheelchair/BSC Sit to Stand: Contact guard assist   Step pivot transfers: Contact guard assist       General transfer comment: has to lean on armrest of chair during transfers without AD, safer using RW    Ambulation/Gait Ambulation/Gait assistance: Contact guard assist Gait Distance (Feet): 30 Feet Assistive device: Rolling walker (2 wheels) Gait Pattern/deviations: Decreased step length - right, Decreased step length - left, Decreased stride length, Trunk flexed Gait velocity: decreased     General Gait Details: slow labored unsteady cadence without loss of balance, limited mostly due to c/o fatigue  Stairs            Wheelchair Mobility     Tilt Bed    Modified Rankin (Stroke Patients Only)       Balance Overall balance assessment: Needs assistance Sitting-balance support: Feet supported, No upper extremity supported Sitting balance-Leahy Scale: Good Sitting balance - Comments: seated at EOB   Standing balance support: During functional activity, No upper extremity supported Standing balance-Leahy Scale: Poor Standing balance comment: fair/good using RW                             Pertinent Vitals/Pain Pain Assessment Pain Assessment: Faces Faces Pain Scale: Hurts little more Pain Location: bottom, LLE Pain Descriptors / Indicators: Sore, Grimacing, Discomfort Pain Intervention(s): Limited activity within patient's tolerance, Monitored during session, Repositioned    Home Living Family/patient expects to be discharged to:: Private residence Living Arrangements: Non-relatives/Friends Available Help at Discharge:  Family;Friend(s);Available PRN/intermittently;Personal care attendant Type of Home: Mobile home Home Access: Stairs to enter Entrance Stairs-Rails: Right;Left;Can reach both Entrance  Stairs-Number of Steps: 2   Home Layout: One level Home Equipment: Rollator (4 wheels);Shower seat;Toilet riser      Prior Function Prior Level of Function : Independent/Modified Independent             Mobility Comments: Household ambulation using Rollator ADLs Comments: Doesn't drive and says he has help with shopping. independent with ADLs     Extremity/Trunk Assessment   Upper Extremity Assessment Upper Extremity Assessment: Defer to OT evaluation    Lower Extremity Assessment Lower Extremity Assessment: Generalized weakness    Cervical / Trunk Assessment Cervical / Trunk Assessment: Normal  Communication   Communication Communication: No apparent difficulties  Cognition Arousal: Alert Behavior During Therapy: WFL for tasks assessed/performed Overall Cognitive Status: Within Functional Limits for tasks assessed                                          General Comments      Exercises     Assessment/Plan    PT Assessment Patient needs continued PT services  PT Problem List Decreased strength;Decreased activity tolerance;Decreased balance;Decreased mobility       PT Treatment Interventions DME instruction;Gait training;Stair training;Functional mobility training;Therapeutic activities;Therapeutic exercise;Balance training;Patient/family education    PT Goals (Current goals can be found in the Care Plan section)  Acute Rehab PT Goals Patient Stated Goal: return home with friends to assist PT Goal Formulation: With patient Time For Goal Achievement: 12/23/22 Potential to Achieve Goals: Good    Frequency Min 3X/week     Co-evaluation               AM-PAC PT "6 Clicks" Mobility  Outcome Measure Help needed turning from your back to your side while in a flat bed without using bedrails?: None Help needed moving from lying on your back to sitting on the side of a flat bed without using bedrails?: A Little Help needed moving to and  from a bed to a chair (including a wheelchair)?: A Little Help needed standing up from a chair using your arms (e.g., wheelchair or bedside chair)?: A Little Help needed to walk in hospital room?: A Little Help needed climbing 3-5 steps with a railing? : A Lot 6 Click Score: 18    End of Session   Activity Tolerance: Patient tolerated treatment well;Patient limited by fatigue Patient left: in chair;with call bell/phone within reach Nurse Communication: Mobility status PT Visit Diagnosis: Unsteadiness on feet (R26.81);Other abnormalities of gait and mobility (R26.89);Muscle weakness (generalized) (M62.81)    Time: 8413-2440 PT Time Calculation (min) (ACUTE ONLY): 31 min   Charges:   PT Evaluation $PT Eval Moderate Complexity: 1 Mod PT Treatments $Therapeutic Activity: 23-37 mins PT General Charges $$ ACUTE PT VISIT: 1 Visit         1:40 PM, 12/19/22 [name], MPT Physical Therapist with [facility] [phone] office [phone] mobile phone

## 2022-12-19 NOTE — Plan of Care (Signed)
  Problem: Acute Rehab PT Goals(only PT should resolve) Goal: Pt Will Go Supine/Side To Sit Outcome: Progressing Flowsheets (Taken 12/19/2022 1342) Pt will go Supine/Side to Sit:  with modified independence  with supervision Goal: Patient Will Transfer Sit To/From Stand Outcome: Progressing Flowsheets (Taken 12/19/2022 1342) Patient will transfer sit to/from stand:  with modified independence  with supervision Goal: Pt Will Transfer Bed To Chair/Chair To Bed Outcome: Progressing Flowsheets (Taken 12/19/2022 1342) Pt will Transfer Bed to Chair/Chair to Bed:  with modified independence  with supervision Goal: Pt Will Ambulate Outcome: Progressing Flowsheets (Taken 12/19/2022 1342) Pt will Ambulate:  50 feet  with supervision  with rolling walker  with modified independence   1:42 PM, 12/19/22 [name], MPT Physical Therapist with [facility] [phone] office [phone] mobile phone

## 2022-12-19 NOTE — Consult Note (Addendum)
Reason for Consult: Buttock abscesses Referring Physician: Dr. Pennie Rushing [name] is an 67 y.o. male.  HPI: Patient is a 67 year old black male with multiple comorbidities including congestive heart failure with an ejection of 20 to 25%, diabetes mellitus, cocaine use, chronic kidney disease, and history of a left ventricular mural thrombus who presents with buttock abscesses that were found on CT scan of the abdomen and pelvis during a sepsis workup.  He does have a history of buttock abscesses and Fournier's gangrene.  He was started on antibiotics and admitted to the hospital for further evaluation and treatment.  Patient states he does not remember the last time he took cocaine.  He is having pain of the buttocks.  No drainage has been noted by the patient.  Past Medical History:  Diagnosis Date   Bulging lumbar disc    CAD (coronary artery disease) [phone]   a. prior LAD stenting. b. s/p DES to [facility] 08/2015. c. 04/2016 Cardiac cath at [facility]. Patent stent in the PLAD and RCA. Diffuse dLAD, OM2, and  RPDA disease. d. DES to PDA and distal RCA 03/2019    Chronic lower back pain    CKD (chronic kidney disease), stage II    Cocaine abuse (HCC)    Cyst of epididymis    DM2 (diabetes mellitus, type 2) (HCC)    Essential hypertension    Fournier gangrene    GERD (gastroesophageal reflux disease)    Headache    History of pneumonia    Hyperlipidemia    Ischemic cardiomyopathy    LV (left ventricular) mural thrombus    Sleep apnea     Past Surgical History:  Procedure Laterality Date   APPENDECTOMY     BIOPSY  11/11/2022   Procedure: BIOPSY;  Surgeon: Franky Macho, MD;  [facility];  Service: Endoscopy;;   BRONCHIAL NEEDLE ASPIRATION BIOPSY  11/12/2021   Procedure: BRONCHIAL NEEDLE ASPIRATION BIOPSIES;  Surgeon: Omar Person, MD;  Location: [facility] ENDOSCOPY;  Service: Pulmonary;;   CARDIAC CATHETERIZATION N/A 09/07/2015   Procedure: Left Heart Cath and  Coronary Angiography;  Surgeon: Marykay Lex, MD;  Location: [facility] INVASIVE CV LAB;  Service: Cardiovascular;  Laterality: N/A;   CARDIAC CATHETERIZATION N/A 09/07/2015   Procedure: Coronary Stent Intervention;  Surgeon: Marykay Lex, MD;  Location: [facility] INVASIVE CV LAB;  Service: Cardiovascular;  Laterality: N/A;   COLONOSCOPY WITH PROPOFOL N/A 11/11/2022   Procedure: COLONOSCOPY WITH PROPOFOL;  Surgeon: Franky Macho, MD;  [facility];  Service: Endoscopy;  Laterality: N/A;   CORONARY ANGIOGRAM  09/07/13   residual RCA and OM disease   CORONARY ANGIOPLASTY WITH STENT PLACEMENT     CORONARY STENT INTERVENTION N/A 10/16/2018   Procedure: CORONARY STENT INTERVENTION;  Surgeon: Kathleene Hazel, MD;  [facility] INVASIVE CV LAB;  Service: Cardiovascular;  Laterality: N/A;   CORONARY STENT INTERVENTION N/A 03/11/2019   Procedure: CORONARY STENT INTERVENTION;  Surgeon: Corky Crafts, MD;  Location: [facility] INVASIVE CV LAB;  Service: Cardiovascular;  Laterality: N/A;   ESOPHAGOGASTRODUODENOSCOPY (EGD) WITH PROPOFOL N/A 11/11/2022   Procedure: ESOPHAGOGASTRODUODENOSCOPY (EGD) WITH PROPOFOL;  Surgeon: Franky Macho, MD;  [facility];  Service: Endoscopy;  Laterality: N/A;   FRACTURE SURGERY     HEMOSTASIS CLIP PLACEMENT  11/11/2022   Procedure: HEMOSTASIS CLIP PLACEMENT;  Surgeon: Franky Macho, MD;  [facility];  Service: Endoscopy;;   INCISION AND DRAINAGE OF WOUND Left 05/19/2019  Procedure: DEBRIDEMENT LEFT GROIN;  Surgeon: Violeta Gelinas, MD;  Location: [facility] OR;  Service: General;  Laterality: Left;   INSERTION OF ILIAC STENT Left 11/30/2017   Left external illiac stent   INSERTION OF ILIAC STENT  11/30/2017   Procedure: Insertion Of Iliac Stent;  Surgeon: Runell Gess, MD;  Location: [facility] INVASIVE CV LAB;  Service: Cardiovascular;;  Left external illiac stent   KNEE ARTHROSCOPY Left    KNEE SURGERY     "ligaments, cartilage; tendon, put a  pin in" (11/30/2017)   LEFT HEART CATH Bilateral 07/08/2012   Procedure: LEFT HEART CATH;  Surgeon: Corky Crafts, MD;  Location: [facility] CATH LAB;  Service: Cardiovascular;  Laterality: Bilateral;   LEFT HEART CATH AND CORONARY ANGIOGRAPHY N/A 10/16/2018   Procedure: LEFT HEART CATH AND CORONARY ANGIOGRAPHY;  Surgeon: Kathleene Hazel, MD;  [facility] INVAS[facility] CV LAB;  Service: Cardiovascular;  Laterality: N/A;   LEFT HEART CATH AND CORONARY ANGIOGRAPHY N/A 03/11/2019   Procedure: LEFT HEART CATH AND CORONARY ANGIOGRAPHY;  Surgeon: Corky Crafts, MD;  Location: [facility] INVASIVE CV LAB;  Service: Cardiovascular;  Laterality: N/A;   LEFT HEART CATHETERIZATION WITH CORONARY ANGIOGRAM N/A 09/06/2013   STEMI, 2nd ISR LAD. Procedure: LEFT HEART CATHETERIZATION WITH CORONARY ANGIOGRAM;  Surgeon: Corky Crafts, MD;  Location: [facility] CATH LAB;  Service: Cardiovascular;  Laterality: N/A;   LOWER EXTREMITY ANGIOGRAPHY N/A 11/30/2017   Procedure: LOWER EXTREMITY ANGIOGRAPHY;  Surgeon: Runell Gess, MD;  [facility] INVAS[facility] CV LAB;  Service: Cardiovascular;  Laterality: N/A;   PERCUTANEOUS CORONARY STENT INTERVENTION (PCI-S)  07/08/2012   Procedure: PERCUTANEOUS CORONARY STENT INTERVENTION (PCI-S);  Surgeon: Corky Crafts, MD;  Location: [facility] CATH LAB;  Service: Cardiovascular;;  DES LAD   PERCUTANEOUS CORONARY STENT INTERVENTION (PCI-S) N/A 09/06/2013   Procedure: PERCUTANEOUS CORONARY STENT INTERVENTION (PCI-S);  Surgeon: Corky Crafts, MD;  Location: [facility] CATH LAB;  Service: Cardiovascular;  Laterality: N/A;  Mid LAD 3.0/24mm Promus   POLYPECTOMY  11/11/2022   Procedure: POLYPECTOMY INTESTINAL;  Surgeon: Franky Macho, MD;  [facility];  Service: Endoscopy;;   SCLEROTHERAPY  11/11/2022   Procedure: Susa Day;  Surgeon: Franky Macho, MD;  [facility];  Service: Endoscopy;;   SUBMUCOSAL TATTOO INJECTION  11/11/2022   Procedure: SUBMUCOSAL TATTOO  INJECTION;  Surgeon: Franky Macho, MD;  [facility];  Service: Endoscopy;;   THORACENTESIS Right 11/10/2021   Procedure: Alanson Puls;  Surgeon: Leslye Peer, MD;  Location: [facility] ENDOSCOPY;  Service: Cardiopulmonary;  Laterality: Right;   VIDEO BRONCHOSCOPY WITH ENDOBRONCHIAL ULTRASOUND Right 11/12/2021   Procedure: VIDEO BRONCHOSCOPY WITH ENDOBRONCHIAL ULTRASOUND;  Surgeon: Omar Person, MD;  Location: [facility] ENDOSCOPY;  Service: Pulmonary;  Laterality: Right;   WOUND DEBRIDEMENT Left 05/20/2019   Procedure: DEBRIDEMENT GROIN;  Surgeon: Kinsinger, De Blanch, MD;  Location: [facility] OR;  Service: General;  Laterality: Left;   WRIST FRACTURE SURGERY Bilateral     Family History  Problem Relation Age of Onset   Hypertension Mother    Diabetes Mother     Social History:  reports that he has been smoking cigarettes. He has a 24 pack-year smoking history. He has been exposed to tobacco smoke. He has never used smokeless tobacco. He reports that he does not currently use alcohol. He reports that he does not currently use drugs after having used the following drugs: Cocaine.  Allergies:  Allergies  Allergen Reactions   Clopidogrel Other (See Comments)  Drowsy, Skin irritation    Medications: I have reviewed the patient's current medications.  Results for orders placed or performed during the hospital encounter of 12/18/22 (from the past 48 hour(s))  CBG monitoring, ED     Status: Abnormal   Collection Time: 12/18/22 11:27 PM  Result Value Ref Range   Glucose-Capillary 122 (H) 70 - 99 mg/dL    Comment: Glucose reference range applies only to samples taken after fasting for at least 8 hours.  Troponin I (High Sensitivity)     Status: None   Collection Time: 12/18/22 11:44 PM  Result Value Ref Range   Troponin I (High Sensitivity) 14 <18 ng/L    Comment: (NOTE) Elevated high sensitivity troponin I (hsTnI) values and significant  changes across serial measurements may  suggest ACS but many other  chronic and acute conditions are known to elevate hsTnI results.  Refer to the "Links" section for chest pain algorithms and additional  guidance. Performed at [facility], [address], [place], [place] [address]   Resp panel by RT-PCR (RSV, Flu A&B, Covid) Anterior Nasal Swab     Status: None   Collection Time: 12/18/22 11:44 PM   Specimen: Anterior Nasal Swab  Result Value Ref Range   SARS Coronavirus 2 by RT PCR NEGATIVE NEGATIVE    Comment: (NOTE) SARS-CoV-2 target nucleic acids are NOT DETECTED.  The SARS-CoV-2 RNA is generally detectable in upper respiratory specimens during the acute phase of infection. The lowest concentration of SARS-CoV-2 viral copies this assay can detect is 138 copies/mL. A negative result does not preclude SARS-Cov-2 infection and should not be used as the sole basis for treatment or other patient management decisions. A negative result may occur with  improper specimen collection/handling, submission of specimen other than nasopharyngeal swab, presence of viral mutation(s) within the areas targeted by this assay, and inadequate number of viral copies(<138 copies/mL). A negative result must be combined with clinical observations, patient history, and epidemiological information. The expected result is Negative.  Fact Sheet for Patients:  BloggerCourse.com  Fact Sheet for Healthcare Providers:  SeriousBroker.it  This test is no t yet approved or cleared by the [place] FDA and  has been authorized for detection and/or diagnosis of SARS-CoV-2 by FDA under an Emergency Use Authorization (EUA). This EUA will remain  in effect (meaning this test can be used) for the duration of the COVID-19 declaration under Section 564(b)(1) of the Act, 21 U.S.C.section 360bbb-3(b)(1), unless the authorization is terminated  or revoked sooner.       Influenza A by PCR NEGATIVE  NEGATIVE   Influenza B by PCR NEGATIVE NEGATIVE    Comment: (NOTE) The Xpert Xpress SARS-CoV-2/FLU/RSV plus assay is intended as an aid in the diagnosis of influenza from Nasopharyngeal swab specimens and should not be used as a sole basis for treatment. Nasal washings and aspirates are unacceptable for Xpert Xpress SARS-CoV-2/FLU/RSV testing.  Fact Sheet for Patients: BloggerCourse.com  Fact Sheet for Healthcare Providers: SeriousBroker.it  This test is not yet approved or cleared by the [place] FDA and has been authorized for detection and/or diagnosis of SARS-CoV-2 by FDA under an Emergency Use Authorization (EUA). This EUA will remain in effect (meaning this test can be used) for the duration of the COVID-19 declaration under Section 564(b)(1) of the Act, 21 U.S.C. section 360bbb-3(b)(1), unless the authorization is terminated or revoked.     Resp Syncytial Virus by PCR NEGATIVE NEGATIVE    Comment: (NOTE) Fact Sheet for Patients:  BloggerCourse.com  Fact Sheet for Healthcare Providers: SeriousBroker.it  This test is not yet approved or cleared by the [place] FDA and has been authorized for detection and/or diagnosis of SARS-CoV-2 by FDA under an Emergency Use Authorization (EUA). This EUA will remain in effect (meaning this test can be used) for the duration of the COVID-19 declaration under Section 564(b)(1) of the Act, 21 U.S.C. section 360bbb-3(b)(1), unless the authorization is terminated or revoked.  Performed at [facility] [address], [place], [place] [address]   Lactic acid, plasma     Status: None   Collection Time: 12/18/22 11:44 PM  Result Value Ref Range   Lactic Acid, Venous 1.5 0.5 - 1.9 mmol/L    Comment: Performed at [facility], [address] [place], [place] [address]  Comprehensive metabolic panel     Status: Abnormal   Collection  Time: 12/18/22 11:44 PM  Result Value Ref Range   Sodium 136 135 - 145 mmol/L   Potassium 4.8 3.5 - 5.1 mmol/L   Chloride 100 98 - 111 mmol/L   CO2 26 22 - 32 mmol/L   Glucose, Bld 125 (H) 70 - 99 mg/dL    Comment: Glucose reference range applies only to samples taken after fasting for at least 8 hours.   BUN 29 (H) 8 - 23 mg/dL   Creatinine, Ser 6.29 (H) 0.61 - 1.24 mg/dL   Calcium 8.9 8.9 - 52.8 mg/dL   Total Protein 7.9 6.5 - 8.1 g/dL   Albumin 2.9 (L) 3.5 - 5.0 g/dL   AST 14 (L) 15 - 41 U/L   ALT 11 0 - 44 U/L   Alkaline Phosphatase 128 (H) 38 - 126 U/L   Total Bilirubin 0.7 <1.2 mg/dL   GFR, Estimated 35 (L) >60 mL/min    Comment: (NOTE) Calculated using the CKD-EPI Creatinine Equation (2021)    Anion gap 10 5 - 15    Comment: Performed at [facility], [address], [place], [place] [address]  CBC with Differential     Status: Abnormal   Collection Time: 12/18/22 11:44 PM  Result Value Ref Range   WBC 24.7 (H) 4.0 - 10.5 K/uL   RBC 3.89 (L) 4.22 - 5.81 MIL/uL   Hemoglobin 7.9 (L) 13.0 - 17.0 g/dL    Comment: Reticulocyte Hemoglobin testing may be clinically indicated, consider ordering this additional test [id number]    HCT 29.6 (L) 39.0 - 52.0 %   MCV 76.1 (L) 80.0 - 100.0 fL   MCH 20.3 (L) 26.0 - 34.0 pg   MCHC 26.7 (L) 30.0 - 36.0 g/dL   RDW 36.6 (H) 44.0 - 34.7 %   Platelets 610 (H) 150 - 400 K/uL   nRBC 0.7 (H) 0.0 - 0.2 %   Neutrophils Relative % 84 %   Neutro Abs 21.0 (H) 1.7 - 7.7 K/uL   Lymphocytes Relative 6 %   Lymphs Abs 1.4 0.7 - 4.0 K/uL   Monocytes Relative 6 %   Monocytes Absolute 1.5 (H) 0.1 - 1.0 K/uL   Eosinophils Relative 2 %   Eosinophils Absolute 0.4 0.0 - 0.5 K/uL   Basophils Relative 1 %   Basophils Absolute 0.1 0.0 - 0.1 K/uL   Immature Granulocytes 1 %   Abs Immature Granulocytes 0.29 (H) 0.00 - 0.07 K/uL    Comment: Performed at [facility], [address], [place], [place] [address]  Protime-INR     Status: None   Collection  Time: 12/18/22 11:44 PM  Result Value Ref Range   Prothrombin Time 15.0 11.4 - 15.2 seconds   INR 1.2 0.8 - 1.2    Comment: (NOTE) INR goal varies based on device and disease states. Performed at [facility], [address] [place], [place] [address]   APTT     Status: None   Collection Time: 12/18/22 11:44 PM  Result Value Ref Range   aPTT 32 24 - 36 seconds    Comment: Performed at [facility], [address] [place], [place] [address]  Blood Culture (routine x 2)     Status: None (Preliminary result)   Collection Time: 12/18/22 11:44 PM   Specimen: Right Antecubital; Blood  Result Value Ref Range   Specimen Description RIGHT ANTECUBITAL    Special Requests      BOTTLES DRAWN AEROBIC AND ANAEROBIC Blood Culture adequate volume   Culture      NO GROWTH < 12 HOURS Performed at [facility], [address] [place], [place] [address]    Report Status PENDING   CK     Status: None   Collection Time: 12/18/22 11:44 PM  Result Value Ref Range   Total CK 88 49 - 397 U/L    Comment: Performed at [facility], [address] [place], [place] [address]  Blood Culture (routine x 2)     Status: None (Preliminary result)   Collection Time: 12/19/22 12:15 AM   Specimen: BLOOD LEFT HAND  Result Value Ref Range   Specimen Description BLOOD LEFT HAND    Special Requests      BOTTLES DRAWN AEROBIC AND ANAEROBIC Blood Culture adequate volume   Culture      NO GROWTH < 12 HOURS Performed at [facility], [address], [place], [place] [address]    Report Status PENDING   Lactic acid, plasma     Status: Abnormal   Collection Time: 12/19/22  1:41 AM  Result Value Ref Range   Lactic Acid, Venous 2.4 (HH) 0.5 - 1.9 mmol/L    Comment: CRITICAL RESULT CALLED TO, READ BACK BY AND VERIFIED WITH CHESTER,A @ 0225 ON 12/19/22 BY JUW Performed at [facility], [address] [place], [place] [address]   Troponin I (High Sensitivity)     Status: None   Collection Time: 12/19/22  1:41 AM  Result  Value Ref Range   Troponin I (High Sensitivity) 13 <18 ng/L    Comment: (NOTE) Elevated high sensitivity troponin I (hsTnI) values and significant  changes across serial measurements may suggest ACS but many other  chronic and acute conditions are known to elevate hsTnI results.  Refer to the "Links" section for chest pain algorithms and additional  guidance. Performed at [facility], [address], [place], [place] [address]   CBC     Status: Abnormal   Collection Time: 12/19/22  4:37 AM  Result Value Ref Range   WBC 24.2 (H) 4.0 - 10.5 K/uL   RBC 3.85 (L) 4.22 - 5.81 MIL/uL   Hemoglobin 7.8 (L) 13.0 - 17.0 g/dL    Comment: Reticulocyte Hemoglobin testing may be clinically indicated, consider ordering this additional test [id number]    HCT 29.6 (L) 39.0 - 52.0 %   MCV 76.9 (L) 80.0 - 100.0 fL   MCH 20.3 (L) 26.0 - 34.0 pg   MCHC 26.4 (L) 30.0 - 36.0 g/dL   RDW 30.0 (H) 76.2 - 26.3 %   Platelets 604 (H) 150 - 400 K/uL   nRBC 0.6 (H) 0.0 -  0.2 %    Comment: Performed at [facility], [address], [place], [place] [address]  Basic metabolic panel     Status: Abnormal   Collection Time: 12/19/22  4:37 AM  Result Value Ref Range   Sodium 136 135 - 145 mmol/L   Potassium 4.8 3.5 - 5.1 mmol/L   Chloride 101 98 - 111 mmol/L   CO2 24 22 - 32 mmol/L   Glucose, Bld 151 (H) 70 - 99 mg/dL    Comment: Glucose reference range applies only to samples taken after fasting for at least 8 hours.   BUN 28 (H) 8 - 23 mg/dL   Creatinine, Ser 6.38 (H) 0.61 - 1.24 mg/dL   Calcium 8.8 (L) 8.9 - 10.3 mg/dL   GFR, Estimated 37 (L) >60 mL/min    Comment: (NOTE) Calculated using the CKD-EPI Creatinine Equation (2021)    Anion gap 11 5 - 15    Comment: Performed at [facility], [address] [place], [place] [address]  Magnesium     Status: Abnormal   Collection Time: 12/19/22  4:37 AM  Result Value Ref Range   Magnesium 2.6 (H) 1.7 - 2.4 mg/dL    Comment: Performed at [facility],  [address] [place], [place] [address]  Phosphorus     Status: None   Collection Time: 12/19/22  4:37 AM  Result Value Ref Range   Phosphorus 3.8 2.5 - 4.6 mg/dL    Comment: Performed at [facility], [address], [place], [place] [address]  Lactic acid, plasma     Status: None   Collection Time: 12/19/22  4:41 AM  Result Value Ref Range   Lactic Acid, Venous 1.9 0.5 - 1.9 mmol/L    Comment: Performed at [facility], [address], [place], [place] [address]  MRSA Next Gen by PCR, Nasal     Status: None   Collection Time: 12/19/22  5:07 AM   Specimen: Nasal Mucosa; Nasal Swab  Result Value Ref Range   MRSA by PCR Next Gen NOT DETECTED NOT DETECTED    Comment: (NOTE) The GeneXpert MRSA Assay (FDA approved for NASAL specimens only), is one component of a comprehensive MRSA colonization surveillance program. It is not intended to diagnose MRSA infection nor to guide or monitor treatment for MRSA infections. Test performance is not FDA approved in patients less than 43 years old. Performed at [facility], [address], [place], [place] [address]   Urinalysis, w/ Reflex to Culture (Infection Suspected) -Urine, Clean Catch     Status: Abnormal   Collection Time: 12/19/22  7:38 AM  Result Value Ref Range   Specimen Source URINE, CLEAN CATCH    Color, Urine YELLOW YELLOW   APPearance CLEAR CLEAR   Specific Gravity, Urine 1.014 1.005 - 1.030   pH 6.0 5.0 - 8.0   Glucose, UA >=500 (A) NEGATIVE mg/dL   Hgb urine dipstick NEGATIVE NEGATIVE   Bilirubin Urine NEGATIVE NEGATIVE   Ketones, ur NEGATIVE NEGATIVE mg/dL   Protein, ur 30 (A) NEGATIVE mg/dL   Nitrite NEGATIVE NEGATIVE   Leukocytes,Ua NEGATIVE NEGATIVE   RBC / HPF 0-5 0 - 5 RBC/hpf   WBC, UA 0-5 0 - 5 WBC/hpf    Comment:        Reflex urine culture not performed if WBC <=10, OR if Squamous epithelial cells >5. If Squamous epithelial cells >5 suggest recollection.    Bacteria, UA NONE SEEN NONE SEEN   Squamous Epithelial /  HPF  0-5 0 - 5 /HPF    Comment: Performed at [facility], [address] [place], [place] [address]  Rapid urine drug screen (hospital performed)     Status: None   Collection Time: 12/19/22  7:38 AM  Result Value Ref Range   Opiates NONE DETECTED NONE DETECTED   Cocaine NONE DETECTED NONE DETECTED   Benzodiazepines NONE DETECTED NONE DETECTED   Amphetamines NONE DETECTED NONE DETECTED   Tetrahydrocannabinol NONE DETECTED NONE DETECTED   Barbiturates NONE DETECTED NONE DETECTED    Comment: (NOTE) DRUG SCREEN FOR MEDICAL PURPOSES ONLY.  IF CONFIRMATION IS NEEDED FOR ANY PURPOSE, NOTIFY LAB WITHIN 5 DAYS.  LOWEST DETECTABLE LIMITS FOR URINE DRUG SCREEN Drug Class                     Cutoff (ng/mL) Amphetamine and metabolites    1000 Barbiturate and metabolites    200 Benzodiazepine                 200 Opiates and metabolites        300 Cocaine and metabolites        300 THC                            50 Performed at [facility], [address], [place], [place] [address]   Lactic acid, plasma     Status: Abnormal   Collection Time: 12/19/22  7:46 AM  Result Value Ref Range   Lactic Acid, Venous 2.6 (HH) 0.5 - 1.9 mmol/L    Comment: CRITICAL RESULT CALLED TO, READ BACK BY AND VERIFIED WITH RAY,A AT 8:15AM ON 12/19/22 BY [facility] Performed at [facility], [address], [place], [place] [address]     CT CHEST ABDOMEN PELVIS WO CONTRAST  Result Date: 12/19/2022 CLINICAL DATA:  Fall, shortness of breath, buttock abscesses EXAM: CT CHEST, ABDOMEN AND PELVIS WITHOUT CONTRAST TECHNIQUE: Multidetector CT imaging of the chest, abdomen and pelvis was performed following the standard protocol without IV contrast. RADIATION DOSE REDUCTION: This exam was performed according to the departmental dose-optimization program which includes automated exposure control, adjustment of the mA and/or kV according to patient size and/or use of iterative reconstruction technique. COMPARISON:  CTA  abdomen/pelvis dated 11/02/2022 FINDINGS: CT CHEST FINDINGS Cardiovascular: Cardiomegaly. No pericardial effusion. Hypodense blood pool relative to myocardium, suggesting anemia. No evidence of thoracic aortic aneurysm. Atherosclerotic calcifications of the arch. Severe three-vessel coronary atherosclerosis. Mediastinum/Nodes: Small mediastinal nodes, measuring up to 12 mm short axis in the right paratracheal region (series 2/image 14), previously 17 mm, likely reactive. Visualized thyroid is unremarkable. Lungs/Pleura: Mosaic attenuation with mild scarring/atelectasis in the lungs bilaterally. No focal consolidation. No suspicious pulmonary nodules. No pleural effusion or pneumothorax. Musculoskeletal: Degenerative changes of the thoracic spine. Sternum, clavicles, and scapulae are intact. Bilateral ribs are intact. CT ABDOMEN PELVIS FINDINGS Hepatobiliary: Unenhanced liver is unremarkable. Layering tiny gallstones (series 2/image 58), without associated inflammatory changes. No intrahepatic or extrahepatic ductal dilatation. Pancreas: Within normal limits. Spleen: Within normal limits. Adrenals/Urinary Tract: Adrenal glands are within normal limits. Kidneys are within normal limits.  No hydronephrosis. Bladder is within normal limits. Stomach/Bowel: Stomach is within normal limits. No evidence of bowel obstruction. Normal appendix/appendiceal stump (series 2/image 84). No colonic wall thickening or inflammatory changes. Vascular/Lymphatic: No evidence of abdominal aortic aneurysm. Atherosclerotic calcifications of the abdominal aorta and branch vessels. Left external iliac stent. No suspicious abdominopelvic lymphadenopathy. Reproductive: Prostate is  unremarkable. Other: No abdominopelvic ascites. Musculoskeletal: 3.1 x 2.8 cm fluid collection with trace nondependent gas along the left medial gluteal cleft (series 2/image 122). 6.5 x 3.4 cm fluid collection inferiorly along the right medial gluteal cleft (series  2/image 135). These likely correspond to the patient's known buttock abscesses. Mild degenerative changes of the lumbar spine.  No fracture is seen. IMPRESSION: No traumatic injury to the chest, abdomen, or pelvis. Bilateral buttock abscesses, right greater than left. Additional ancillary findings as above. Electronically Signed   By: Charline Bills M.D.   On: 12/19/2022 00:19   DG Chest Port 1 View  Result Date: 12/19/2022 CLINICAL DATA:  Questionable sepsis - evaluate for abnormality EXAM: PORTABLE CHEST 1 VIEW COMPARISON:  11/28/2022 FINDINGS: Cardiomegaly. Vascular congestion. Low lung volumes with bibasilar atelectasis. No overt edema or effusions. No acute bony abnormality. IMPRESSION: Cardiomegaly, vascular congestion. Low lung volumes with bibasilar atelectasis. Electronically Signed   By: Charlett Nose M.D.   On: 12/19/2022 00:19   CT Cervical Spine Wo Contrast  Result Date: 12/19/2022 CLINICAL DATA:  Fall.  Neck trauma (Age >= 65y) EXAM: CT CERVICAL SPINE WITHOUT CONTRAST TECHNIQUE: Multidetector CT imaging of the cervical spine was performed without intravenous contrast. Multiplanar CT image reconstructions were also generated. RADIATION DOSE REDUCTION: This exam was performed according to the departmental dose-optimization program which includes automated exposure control, adjustment of the mA and/or kV according to patient size and/or use of iterative reconstruction technique. COMPARISON:  11/07/2021 FINDINGS: Alignment: Normal Skull base and vertebrae: No acute fracture. No primary bone lesion or focal pathologic process. Soft tissues and spinal canal: No prevertebral fluid or swelling. No visible canal hematoma. Disc levels: Mild diffuse degenerative disc disease and facet disease. Upper chest: No acute findings Other: None IMPRESSION: No acute bony abnormality. Electronically Signed   By: Charlett Nose M.D.   On: 12/19/2022 00:14   CT HEAD WO CONTRAST ( )  Result Date:  12/19/2022 CLINICAL DATA:  Fall.  Head trauma, minor (Age >= 65y) EXAM: CT HEAD WITHOUT CONTRAST TECHNIQUE: Contiguous axial images were obtained from the base of the skull through the vertex without intravenous contrast. RADIATION DOSE REDUCTION: This exam was performed according to the departmental dose-optimization program which includes automated exposure control, adjustment of the mA and/or kV according to patient size and/or use of iterative reconstruction technique. COMPARISON:  11/28/2022, MRI 11/06/2022 FINDINGS: Brain: There is atrophy and chronic small vessel disease changes. Low-density/edema again noted in the right temporal lobe, unchanged. 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 No acute infarct, hemorrhage or hydrocephalus. Vascular: No hyperdense vessel or unexpected calcification. Skull: No acute calvarial abnormality. Sinuses/Orbits: No acute findings Other: None IMPRESSION: Stable edema within the right temporal lobe, possibly related to known right middle cranial fossa meningioma, not seen on this study. Old infarcts as above. Atrophy, chronic microvascular disease. No acute intracranial abnormality. Electronically Signed   By: Charlett Nose M.D.   On: 12/19/2022 00:13    ROS:  Review of systems not obtained due to patient factors.  Blood pressure (!) 89/63, pulse 76, temperature 98.2 F (36.8 C), temperature source Oral, resp. rate 19, height 5\' 9"  (1.753 m), weight 111 kg, SpO2 100%. Physical Exam: Patient is a 67 year old black male lying in the bed and uncomfortable when touched. Buttock: A fluctuant mass is noted in the perianal region on the buttock.  An indurated area is noted towards the coccyx of the right buttock.  It was not fluctuant.  No drainage was noted.  Bedside incision and drainage of the left buttock abscess was performed.  Purulent drainage was found.  A culture was taken and sent to microbiology.  A dressing was then  placed.  Assessment/Plan: Impression: Left buttock abscess, drained.  Right buttock abscess is not amenable to drainage at the present time.  Will follow with you.  This may drain on its own.  Adding IV Flagyl for anaerobic coverage.  May de-escalate antibiotics once culture results are known.  Patient did not have evidence of Fournier's gangrene at the present time.  Franky Macho 12/19/2022, 11:34 AM

## 2022-12-19 NOTE — Progress Notes (Signed)
Patient seen and examined; admitted after midnight secondary to sepsis due to perianal abscesses.  Reports no feeling well and experiencing generalized pain.  Please refer to H&P written by Dr. Margo Aye for further info/details on admission.  Plan: -Continue current IV antibiotic -Follow recommendation and assistance by general surgery regarding further debridement and care -Continue judicious fluid resuscitation and supportive care -Guarded prognosis. -Follow UDS/UA.  Vassie Loll MD [phone]

## 2022-12-19 NOTE — ED Notes (Signed)
ED TO INPATIENT HANDOFF REPORT  ED Nurse Name and Phone #: Lattie Haw, RN  S Name/Age/Gender [name] 67 y.o. male Room/Bed: APA19/APA19  Code Status   Code Status: Full Code  Home/SNF/Other Home Patient oriented to: self, Time, Place Is this baseline? Yes   Triage Complete: Triage complete  Chief Complaint Sepsis [facility]) [A41.9]  Triage Note Pt states he fell into the shower and 2 abscesses to buttocks.    Allergies Allergies  Allergen Reactions   Clopidogrel Other (See Comments)    Drowsy, Skin irritation    Level of Care/Admitting Diagnosis ED Disposition     ED Disposition  Admit   Condition  --   Comment  Hospital Area: [facility] [[id number]]  Level of Care: Telemetry [5]  Covid Evaluation: Asymptomatic - no recent exposure (last 10 days) testing not required  Diagnosis: Sepsis [facility]) [[id number]]  Admitting Physician: Darlin Drop [[id number]]  Attending Physician: Darlin Drop [[id number]]  Certification:: I certify this patient will need inpatient services for at least 2 midnights  Expected Medical Readiness: 12/21/2022          B Medical/Surgery History Past Medical History:  Diagnosis Date   Bulging lumbar disc    CAD (coronary artery disease) [phone]   a. prior LAD stenting. b. s/p DES to [facility] 08/2015. c. 04/2016 Cardiac cath at [facility]. Patent stent in the PLAD and RCA. Diffuse dLAD, OM2, and  RPDA disease. d. DES to PDA and distal RCA 03/2019    Chronic lower back pain    CKD (chronic kidney disease), stage II    Cocaine abuse (HCC)    Cyst of epididymis    DM2 (diabetes mellitus, type 2) (HCC)    Essential hypertension    Fournier gangrene    GERD (gastroesophageal reflux disease)    Headache    History of pneumonia    Hyperlipidemia    Ischemic cardiomyopathy    LV (left ventricular) mural thrombus    Sleep apnea    Past Surgical History:  Procedure Laterality Date   APPENDECTOMY     BIOPSY  11/11/2022    Procedure: BIOPSY;  Surgeon: Franky Macho, MD;  [facility];  Service: Endoscopy;;   BRONCHIAL NEEDLE ASPIRATION BIOPSY  11/12/2021   Procedure: BRONCHIAL NEEDLE ASPIRATION BIOPSIES;  Surgeon: Omar Person, MD;  Location: [facility] ENDOSCOPY;  Service: Pulmonary;;   CARDIAC CATHETERIZATION N/A 09/07/2015   Procedure: Left Heart Cath and Coronary Angiography;  Surgeon: Marykay Lex, MD;  Location: [facility] INVASIVE CV LAB;  Service: Cardiovascular;  Laterality: N/A;   CARDIAC CATHETERIZATION N/A 09/07/2015   Procedure: Coronary Stent Intervention;  Surgeon: Marykay Lex, MD;  Location: [facility] INVASIVE CV LAB;  Service: Cardiovascular;  Laterality: N/A;   COLONOSCOPY WITH PROPOFOL N/A 11/11/2022   Procedure: COLONOSCOPY WITH PROPOFOL;  Surgeon: Franky Macho, MD;  Location[facility];  Service: Endoscopy;  Laterality: N/A;   CORONARY ANGIOGRAM  09/07/13   residual RCA and OM disease   CORONARY ANGIOPLASTY WITH STENT PLACEMENT     CORONARY STENT INTERVENTION N/A 10/16/2018   Procedure: CORONARY STENT INTERVENTION;  Surgeon: Kathleene Hazel, MD;  [facility]: [facility] INVASIVE CV LAB;  Service: Cardiovascular;  Laterality: N/A;   CORONARY STENT INTERVENTION N/A 03/11/2019   Procedure: CORONARY STENT INTERVENTION;  Surgeon: Corky Crafts, MD;  Location: [facility] INVASIVE CV LAB;  Service: Cardiovascular;  Laterality: N/A;   ESOPHAGOGASTRODUODENOSCOPY (EGD) WITH PROPOFOL N/A 11/11/2022   Procedure: ESOPHAGOGASTRODUODENOSCOPY (  EGD) WITH PROPOFOL;  Surgeon: Franky Macho, MD;  [facility];  Service: Endoscopy;  Laterality: N/A;   FRACTURE SURGERY     HEMOSTASIS CLIP PLACEMENT  11/11/2022   Procedure: HEMOSTASIS CLIP PLACEMENT;  Surgeon: Franky Macho, MD;  [facility];  Service: Endoscopy;;   INCISION AND DRAINAGE OF WOUND Left 05/19/2019   Procedure: DEBRIDEMENT LEFT GROIN;  Surgeon: Violeta Gelinas, MD;  Location: [facility] OR;  Service: General;  Laterality: Left;    INSERTION OF ILIAC STENT Left 11/30/2017   Left external illiac stent   INSERTION OF ILIAC STENT  11/30/2017   Procedure: Insertion Of Iliac Stent;  Surgeon: Runell Gess, MD;  Location: [facility] INVASIVE CV LAB;  Service: Cardiovascular;;  Left external illiac stent   KNEE ARTHROSCOPY Left    KNEE SURGERY     "ligaments, cartilage; tendon, put a pin in" (11/30/2017)   LEFT HEART CATH Bilateral 07/08/2012   Procedure: LEFT HEART CATH;  Surgeon: Corky Crafts, MD;  Location: [facility] CATH LAB;  Service: Cardiovascular;  Laterality: Bilateral;   LEFT HEART CATH AND CORONARY ANGIOGRAPHY N/A 10/16/2018   Procedure: LEFT HEART CATH AND CORONARY ANGIOGRAPHY;  Surgeon: Kathleene Hazel, MD;  [facility] INVASIVE CV LAB;  Service: Cardiovascular;  Laterality: N/A;   LEFT HEART CATH AND CORONARY ANGIOGRAPHY N/A 03/11/2019   Procedure: LEFT HEART CATH AND CORONARY ANGIOGRAPHY;  Surgeon: Corky Crafts, MD;  Location: [facility] INVASIVE CV LAB;  Service: Cardiovascular;  Laterality: N/A;   LEFT HEART CATHETERIZATION WITH CORONARY ANGIOGRAM N/A 09/06/2013   STEMI, 2nd ISR LAD. Procedure: LEFT HEART CATHETERIZATION WITH CORONARY ANGIOGRAM;  Surgeon: Corky Crafts, MD;  Location: [facility] CATH LAB;  Service: Cardiovascular;  Laterality: N/A;   LOWER EXTREMITY ANGIOGRAPHY N/A 11/30/2017   Procedure: LOWER EXTREMITY ANGIOGRAPHY;  Surgeon: Runell Gess, MD;  [facility] INVASIVE CV LAB;  Service: Cardiovascular;  Laterality: N/A;   PERCUTANEOUS CORONARY STENT INTERVENTION (PCI-S)  07/08/2012   Procedure: PERCUTANEOUS CORONARY STENT INTERVENTION (PCI-S);  Surgeon: Corky Crafts, MD;  Location: [facility] CATH LAB;  Service: Cardiovascular;;  DES LAD   PERCUTANEOUS CORONARY STENT INTERVENTION (PCI-S) N/A 09/06/2013   Procedure: PERCUTANEOUS CORONARY STENT INTERVENTION (PCI-S);  Surgeon: Corky Crafts, MD;  Location: [facility] CATH LAB;  Service: Cardiovascular;  Laterality: N/A;  Mid LAD 3.0/24mm Promus    POLYPECTOMY  11/11/2022   Procedure: POLYPECTOMY INTESTINAL;  Surgeon: Franky Macho, MD;  [facility];  Service: Endoscopy;;   SCLEROTHERAPY  11/11/2022   Procedure: Susa Day;  Surgeon: Franky Macho, MD;  [facility];  Service: Endoscopy;;   SUBMUCOSAL TATTOO INJECTION  11/11/2022   Procedure: SUBMUCOSAL TATTOO INJECTION;  Surgeon: Franky Macho, MD;  [facility];  Service: Endoscopy;;   THORACENTESIS Right 11/10/2021   Procedure: Alanson Puls;  Surgeon: Leslye Peer, MD;  Location: [facility] ENDOSCOPY;  Service: Cardiopulmonary;  Laterality: Right;   VIDEO BRONCHOSCOPY WITH ENDOBRONCHIAL ULTRASOUND Right 11/12/2021   Procedure: VIDEO BRONCHOSCOPY WITH ENDOBRONCHIAL ULTRASOUND;  Surgeon: Omar Person, MD;  Location: [facility] ENDOSCOPY;  Service: Pulmonary;  Laterality: Right;   WOUND DEBRIDEMENT Left 05/20/2019   Procedure: DEBRIDEMENT GROIN;  Surgeon: Kinsinger, De Blanch, MD;  Location: [facility] OR;  Service: General;  Laterality: Left;   WRIST FRACTURE SURGERY Bilateral      A IV Location/Drains/Wounds Patient Lines/Drains/Airways Status     Active Line/Drains/Airways     Name Placement date Placement time Site Days   Peripheral  IV 12/18/22 20 G Left Antecubital 12/18/22  2339  Antecubital  1            Intake/Output Last 24 hours No intake or output data in the 24 hours ending 12/19/22 0354  Labs/Imaging Results for orders placed or performed during the hospital encounter of 12/18/22 (from the past 48 hour(s))  CBG monitoring, ED     Status: Abnormal   Collection Time: 12/18/22 11:27 PM  Result Value Ref Range   Glucose-Capillary 122 (H) 70 - 99 mg/dL    Comment: Glucose reference range applies only to samples taken after fasting for at least 8 hours.  Troponin I (High Sensitivity)     Status: None   Collection Time: 12/18/22 11:44 PM  Result Value Ref Range   Troponin I (High Sensitivity) 14 <18 ng/L    Comment:  (NOTE) Elevated high sensitivity troponin I (hsTnI) values and significant  changes across serial measurements may suggest ACS but many other  chronic and acute conditions are known to elevate hsTnI results.  Refer to the "Links" section for chest pain algorithms and additional  guidance. Performed at [facility], [address], [place], [place] [address]   Resp panel by RT-PCR (RSV, Flu A&B, Covid) Anterior Nasal Swab     Status: None   Collection Time: 12/18/22 11:44 PM   Specimen: Anterior Nasal Swab  Result Value Ref Range   SARS Coronavirus 2 by RT PCR NEGATIVE NEGATIVE    Comment: (NOTE) SARS-CoV-2 target nucleic acids are NOT DETECTED.  The SARS-CoV-2 RNA is generally detectable in upper respiratory specimens during the acute phase of infection. The lowest concentration of SARS-CoV-2 viral copies this assay can detect is 138 copies/mL. A negative result does not preclude SARS-Cov-2 infection and should not be used as the sole basis for treatment or other patient management decisions. A negative result may occur with  improper specimen collection/handling, submission of specimen other than nasopharyngeal swab, presence of viral mutation(s) within the areas targeted by this assay, and inadequate number of viral copies(<138 copies/mL). A negative result must be combined with clinical observations, patient history, and epidemiological information. The expected result is Negative.  Fact Sheet for Patients:  BloggerCourse.com  Fact Sheet for Healthcare Providers:  SeriousBroker.it  This test is no t yet approved or cleared by the [place] FDA and  has been authorized for detection and/or diagnosis of SARS-CoV-2 by FDA under an Emergency Use Authorization (EUA). This EUA will remain  in effect (meaning this test can be used) for the duration of the COVID-19 declaration under Section 564(b)(1) of the Act, 21 U.S.C.section  360bbb-3(b)(1), unless the authorization is terminated  or revoked sooner.       Influenza A by PCR NEGATIVE NEGATIVE   Influenza B by PCR NEGATIVE NEGATIVE    Comment: (NOTE) The Xpert Xpress SARS-CoV-2/FLU/RSV plus assay is intended as an aid in the diagnosis of influenza from Nasopharyngeal swab specimens and should not be used as a sole basis for treatment. Nasal washings and aspirates are unacceptable for Xpert Xpress SARS-CoV-2/FLU/RSV testing.  Fact Sheet for Patients: BloggerCourse.com  Fact Sheet for Healthcare Providers: SeriousBroker.it  This test is not yet approved or cleared by the [place] FDA and has been authorized for detection and/or diagnosis of SARS-CoV-2 by FDA under an Emergency Use Authorization (EUA). This EUA will remain in effect (meaning this test can be used) for the duration of the COVID-19 declaration under Section 564(b)(1) of the Act, 21 U.S.C. section 360bbb-3(b)(1),  unless the authorization is terminated or revoked.     Resp Syncytial Virus by PCR NEGATIVE NEGATIVE    Comment: (NOTE) Fact Sheet for Patients: BloggerCourse.com  Fact Sheet for Healthcare Providers: SeriousBroker.it  This test is not yet approved or cleared by the [place] FDA and has been authorized for detection and/or diagnosis of SARS-CoV-2 by FDA under an Emergency Use Authorization (EUA). This EUA will remain in effect (meaning this test can be used) for the duration of the COVID-19 declaration under Section 564(b)(1) of the Act, 21 U.S.C. section 360bbb-3(b)(1), unless the authorization is terminated or revoked.  Performed at [facility], [address], [place], [place] [address]   Lactic acid, plasma     Status: None   Collection Time: 12/18/22 11:44 PM  Result Value Ref Range   Lactic Acid, Venous 1.5 0.5 - 1.9 mmol/L    Comment: Performed at [facility], [address], [place], [place] [address]  Comprehensive metabolic panel     Status: Abnormal   Collection Time: 12/18/22 11:44 PM  Result Value Ref Range   Sodium 136 135 - 145 mmol/L   Potassium 4.8 3.5 - 5.1 mmol/L   Chloride 100 98 - 111 mmol/L   CO2 26 22 - 32 mmol/L   Glucose, Bld 125 (H) 70 - 99 mg/dL    Comment: Glucose reference range applies only to samples taken after fasting for at least 8 hours.   BUN 29 (H) 8 - 23 mg/dL   Creatinine, Ser 2.13 (H) 0.61 - 1.24 mg/dL   Calcium 8.9 8.9 - 08.6 mg/dL   Total Protein 7.9 6.5 - 8.1 g/dL   Albumin 2.9 (L) 3.5 - 5.0 g/dL   AST 14 (L) 15 - 41 U/L   ALT 11 0 - 44 U/L   Alkaline Phosphatase 128 (H) 38 - 126 U/L   Total Bilirubin 0.7 <1.2 mg/dL   GFR, Estimated 35 (L) >60 mL/min    Comment: (NOTE) Calculated using the CKD-EPI Creatinine Equation (2021)    Anion gap 10 5 - 15    Comment: Performed at [facility], [address], [place], [place] [address]  CBC with Differential     Status: Abnormal   Collection Time: 12/18/22 11:44 PM  Result Value Ref Range   WBC 24.7 (H) 4.0 - 10.5 K/uL   RBC 3.89 (L) 4.22 - 5.81 MIL/uL   Hemoglobin 7.9 (L) 13.0 - 17.0 g/dL    Comment: Reticulocyte Hemoglobin testing may be clinically indicated, consider ordering this additional test [id number]    HCT 29.6 (L) 39.0 - 52.0 %   MCV 76.1 (L) 80.0 - 100.0 fL   MCH 20.3 (L) 26.0 - 34.0 pg   MCHC 26.7 (L) 30.0 - 36.0 g/dL   RDW 13.2 (H) 44.0 - 10.2 %   Platelets 610 (H) 150 - 400 K/uL   nRBC 0.7 (H) 0.0 - 0.2 %   Neutrophils Relative % 84 %   Neutro Abs 21.0 (H) 1.7 - 7.7 K/uL   Lymphocytes Relative 6 %   Lymphs Abs 1.4 0.7 - 4.0 K/uL   Monocytes Relative 6 %   Monocytes Absolute 1.5 (H) 0.1 - 1.0 K/uL   Eosinophils Relative 2 %   Eosinophils Absolute 0.4 0.0 - 0.5 K/uL   Basophils Relative 1 %   Basophils Absolute 0.1 0.0 - 0.1 K/uL   Immature Granulocytes 1 %   Abs Immature Granulocytes 0.29 (H) 0.00 - 0.07 K/uL    Comment:  Performed at [facility], [address] [place], [place] [address]  Protime-INR     Status: None   Collection Time: 12/18/22 11:44 PM  Result Value Ref Range   Prothrombin Time 15.0 11.4 - 15.2 seconds   INR 1.2 0.8 - 1.2    Comment: (NOTE) INR goal varies based on device and disease states. Performed at [facility], [address], [place], [place] [address]   APTT     Status: None   Collection Time: 12/18/22 11:44 PM  Result Value Ref Range   aPTT 32 24 - 36 seconds    Comment: Performed at [facility], [address] [name], [place] [address]  CK     Status: None   Collection Time: 12/18/22 11:44 PM  Result Value Ref Range   Total CK 88 49 - 397 U/L    Comment: Performed at [facility], [address] [place], [place] [address]  Blood Culture (routine x 2)     Status: None (Preliminary result)   Collection Time: 12/19/22 12:15 AM   Specimen: BLOOD LEFT HAND  Result Value Ref Range   Specimen Description BLOOD LEFT HAND    Special Requests      BOTTLES DRAWN AEROBIC AND ANAEROBIC Blood Culture adequate volume Performed at [facility], [address], [place], [place] [address]    Culture PENDING    Report Status PENDING   Lactic acid, plasma     Status: Abnormal   Collection Time: 12/19/22  1:41 AM  Result Value Ref Range   Lactic Acid, Venous 2.4 (HH) 0.5 - 1.9 mmol/L    Comment: CRITICAL RESULT CALLED TO, READ BACK BY AND VERIFIED WITH Arlet Marter,A @ 0225 ON 12/19/22 BY JUW Performed at [facility], [address], [place], [place] [address]   Troponin I (High Sensitivity)     Status: None   Collection Time: 12/19/22  1:41 AM  Result Value Ref Range   Troponin I (High Sensitivity) 13 <18 ng/L    Comment: (NOTE) Elevated high sensitivity troponin I (hsTnI) values and significant  changes across serial measurements may suggest ACS but many other  chronic and acute conditions are known to elevate hsTnI results.  Refer to the "Links" section for chest pain algorithms  and additional  guidance. Performed at [facility], [address], [place], [place] [address]    CT CHEST ABDOMEN PELVIS WO CONTRAST  Result Date: 12/19/2022 CLINICAL DATA:  Fall, shortness of breath, buttock abscesses EXAM: CT CHEST, ABDOMEN AND PELVIS WITHOUT CONTRAST TECHNIQUE: Multidetector CT imaging of the chest, abdomen and pelvis was performed following the standard protocol without IV contrast. RADIATION DOSE REDUCTION: This exam was performed according to the departmental dose-optimization program which includes automated exposure control, adjustment of the mA and/or kV according to patient size and/or use of iterative reconstruction technique. COMPARISON:  CTA abdomen/pelvis dated 11/02/2022 FINDINGS: CT CHEST FINDINGS Cardiovascular: Cardiomegaly. No pericardial effusion. Hypodense blood pool relative to myocardium, suggesting anemia. No evidence of thoracic aortic aneurysm. Atherosclerotic calcifications of the arch. Severe three-vessel coronary atherosclerosis. Mediastinum/Nodes: Small mediastinal nodes, measuring up to 12 mm short axis in the right paratracheal region (series 2/image 14), previously 17 mm, likely reactive. Visualized thyroid is unremarkable. Lungs/Pleura: Mosaic attenuation with mild scarring/atelectasis in the lungs bilaterally. No focal consolidation. No suspicious pulmonary nodules. No pleural effusion or pneumothorax. Musculoskeletal: Degenerative changes of the thoracic spine. Sternum, clavicles, and scapulae are intact. Bilateral ribs are intact. CT ABDOMEN PELVIS FINDINGS Hepatobiliary: Unenhanced liver is unremarkable.  Layering tiny gallstones (series 2/image 58), without associated inflammatory changes. No intrahepatic or extrahepatic ductal dilatation. Pancreas: Within normal limits. Spleen: Within normal limits. Adrenals/Urinary Tract: Adrenal glands are within normal limits. Kidneys are within normal limits.  No hydronephrosis. Bladder is within normal limits.  Stomach/Bowel: Stomach is within normal limits. No evidence of bowel obstruction. Normal appendix/appendiceal stump (series 2/image 84). No colonic wall thickening or inflammatory changes. Vascular/Lymphatic: No evidence of abdominal aortic aneurysm. Atherosclerotic calcifications of the abdominal aorta and branch vessels. Left external iliac stent. No suspicious abdominopelvic lymphadenopathy. Reproductive: Prostate is unremarkable. Other: No abdominopelvic ascites. Musculoskeletal: 3.1 x 2.8 cm fluid collection with trace nondependent gas along the left medial gluteal cleft (series 2/image 122). 6.5 x 3.4 cm fluid collection inferiorly along the right medial gluteal cleft (series 2/image 135). These likely correspond to the patient's known buttock abscesses. Mild degenerative changes of the lumbar spine.  No fracture is seen. IMPRESSION: No traumatic injury to the chest, abdomen, or pelvis. Bilateral buttock abscesses, right greater than left. Additional ancillary findings as above. Electronically Signed   By: Charline Bills M.D.   On: 12/19/2022 00:19   DG Chest Port 1 View  Result Date: 12/19/2022 CLINICAL DATA:  Questionable sepsis - evaluate for abnormality EXAM: PORTABLE CHEST 1 VIEW COMPARISON:  11/28/2022 FINDINGS: Cardiomegaly. Vascular congestion. Low lung volumes with bibasilar atelectasis. No overt edema or effusions. No acute bony abnormality. IMPRESSION: Cardiomegaly, vascular congestion. Low lung volumes with bibasilar atelectasis. Electronically Signed   By: Charlett Nose M.D.   On: 12/19/2022 00:19   CT Cervical Spine Wo Contrast  Result Date: 12/19/2022 CLINICAL DATA:  Fall.  Neck trauma (Age >= 65y) EXAM: CT CERVICAL SPINE WITHOUT CONTRAST TECHNIQUE: Multidetector CT imaging of the cervical spine was performed without intravenous contrast. Multiplanar CT image reconstructions were also generated. RADIATION DOSE REDUCTION: This exam was performed according to the departmental  dose-optimization program which includes automated exposure control, adjustment of the mA and/or kV according to patient size and/or use of iterative reconstruction technique. COMPARISON:  11/07/2021 FINDINGS: Alignment: Normal Skull base and vertebrae: No acute fracture. No primary bone lesion or focal pathologic process. Soft tissues and spinal canal: No prevertebral fluid or swelling. No visible canal hematoma. Disc levels: Mild diffuse degenerative disc disease and facet disease. Upper chest: No acute findings Other: None IMPRESSION: No acute bony abnormality. Electronically Signed   By: Charlett Nose M.D.   On: 12/19/2022 00:14   CT HEAD WO CONTRAST ( )  Result Date: 12/19/2022 CLINICAL DATA:  Fall.  Head trauma, minor (Age >= 65y) EXAM: CT HEAD WITHOUT CONTRAST TECHNIQUE: Contiguous axial images were obtained from the base of the skull through the vertex without intravenous contrast. RADIATION DOSE REDUCTION: This exam was performed according to the departmental dose-optimization program which includes automated exposure control, adjustment of the mA and/or kV according to patient size and/or use of iterative reconstruction technique. COMPARISON:  11/28/2022, MRI 11/06/2022 FINDINGS: Brain: There is atrophy and chronic small vessel disease changes. Low-density/edema again noted in the right temporal lobe, unchanged. 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 No acute infarct, hemorrhage or hydrocephalus. Vascular: No hyperdense vessel or unexpected calcification. Skull: No acute calvarial abnormality. Sinuses/Orbits: No acute findings Other: None IMPRESSION: Stable edema within the right temporal lobe, possibly related to known right middle cranial fossa meningioma, not seen on this study. Old infarcts as above. Atrophy, chronic microvascular disease. No acute intracranial abnormality. Electronically Signed   By: Caryn Bee  Dover M.D.   On: 12/19/2022 00:13    Pending  Labs Unresulted Labs (From admission, onward)     Start     Ordered   12/20/22 0500  HIV Antibody (routine testing w rflx)  (HIV Antibody (Routine testing w reflex) panel)  Tomorrow morning,   R        12/19/22 0352   12/19/22 0500  CBC  Tomorrow morning,   R        12/19/22 0344   12/19/22 0500  Basic metabolic panel  Tomorrow morning,   R        12/19/22 0344   12/19/22 0500  Magnesium  Tomorrow morning,   R        12/19/22 0344   12/19/22 0500  Phosphorus  Tomorrow morning,   R        12/19/22 0344   12/19/22 0231  MRSA Next Gen by PCR, Nasal  ONCE - URGENT,   URGENT       Question:  Patient immune status  Answer:  Normal   12/19/22 0230   12/18/22 2320  Blood Culture (routine x 2)  (Septic presentation on arrival (screening labs, nursing and treatment orders for obvious sepsis))  BLOOD CULTURE X 2,   STAT      12/18/22 2320   12/18/22 2320  Urinalysis, w/ Reflex to Culture (Infection Suspected) -Urine, Clean Catch  (Septic presentation on arrival (screening labs, nursing and treatment orders for obvious sepsis))  ONCE - URGENT,   URGENT       Question:  Specimen Source  Answer:  Urine, Clean Catch   12/18/22 2320            Vitals/Pain Today's Vitals   12/19/22 0130 12/19/22 0200 12/19/22 0320 12/19/22 0330  BP: (!) 93/50 96/72 (!) 98/55 101/72  Pulse: 83 82 (!) 184 78  Resp: (!) 21 13 15 12   Temp:      TempSrc:      SpO2: 99% 97% 97% 94%  Weight:      Height:      PainSc:        Isolation Precautions No active isolations  Medications Medications  vancomycin (VANCOCIN) IVPB 1000 mg/200 mL premix (1,000 mg Intravenous New Bag/Given 12/19/22 0300)    Followed by  vancomycin (VANCOCIN) IVPB 1000 mg/200 mL premix (has no administration in time range)  heparin injection 5,000 Units (has no administration in time range)  acetaminophen (TYLENOL) tablet 650 mg (has no administration in time range)  oxyCODONE (Oxy IR/ROXICODONE) immediate release tablet 5 mg (has no  administration in time range)  HYDROmorphone (DILAUDID) injection 0.5 mg (has no administration in time range)  polyethylene glycol (MIRALAX / GLYCOLAX) packet 17 g (has no administration in time range)  prochlorperazine (COMPAZINE) injection 5 mg (has no administration in time range)  melatonin tablet 6 mg (has no administration in time range)  ferrous sulfate tablet 325 mg (has no administration in time range)  folic acid (FOLVITE) tablet 1 mg (has no administration in time range)  gabapentin (NEURONTIN) capsule 100 mg (has no administration in time range)  linaclotide (LINZESS) capsule 145 mcg (has no administration in time range)  pantoprazole (PROTONIX) EC tablet 40 mg (has no administration in time range)  fentaNYL (SUBLIMAZE) injection 25 mcg (25 mcg Intravenous Given 12/18/22 2339)  cefTRIAXone (ROCEPHIN) 2 g in sodium chloride 0.9 % 100 mL IVPB (0 g Intravenous Stopped 12/19/22 0208)  sodium chloride 0.9 % bolus 500 mL (0 mLs Intravenous  Stopped 12/19/22 0208)    Mobility walks with person assist     Focused Assessments Cardiac Assessment Handoff:    Lab Results  Component Value Date   CKTOTAL 88 12/18/2022   CKMB 143.5 (HH) 09/06/2013   TROPONINI <0.03 11/04/2016   Lab Results  Component Value Date   DDIMER 0.32 10/15/2018   Does the Patient currently have chest pain? No    R Recommendations: See Admitting Provider Note  Report given to:   Additional Notes: none

## 2022-12-20 DIAGNOSIS — K610 Anal abscess: Secondary | ICD-10-CM | POA: Diagnosis not present

## 2022-12-20 DIAGNOSIS — R6520 Severe sepsis without septic shock: Secondary | ICD-10-CM | POA: Diagnosis not present

## 2022-12-20 DIAGNOSIS — N179 Acute kidney failure, unspecified: Secondary | ICD-10-CM | POA: Diagnosis not present

## 2022-12-20 DIAGNOSIS — A419 Sepsis, unspecified organism: Secondary | ICD-10-CM | POA: Diagnosis not present

## 2022-12-20 LAB — BASIC METABOLIC PANEL
Anion gap: 7 (ref 5–15)
BUN: 30 mg/dL — ABNORMAL HIGH (ref 8–23)
CO2: 23 mmol/L (ref 22–32)
Calcium: 8.1 mg/dL — ABNORMAL LOW (ref 8.9–10.3)
Chloride: 103 mmol/L (ref 98–111)
Creatinine, Ser: 2.08 mg/dL — ABNORMAL HIGH (ref 0.61–1.24)
GFR, Estimated: 34 mL/min — ABNORMAL LOW (ref >60)
Glucose, Bld: 126 mg/dL — ABNORMAL HIGH (ref 70–99)
Potassium: 4.6 mmol/L (ref 3.5–5.1)
Sodium: 133 mmol/L — ABNORMAL LOW (ref 135–145)

## 2022-12-20 LAB — BLOOD CULTURE ID PANEL (REFLEXED) - BCID2

## 2022-12-20 LAB — LACTIC ACID, PLASMA: Lactic Acid, Venous: 0.9 mmol/L (ref 0.5–1.9)

## 2022-12-20 LAB — CBC
HCT: 25.7 % — ABNORMAL LOW (ref 39.0–52.0)
Hemoglobin: 6.8 g/dL — CL (ref 13.0–17.0)
MCH: 19.9 pg — ABNORMAL LOW (ref 26.0–34.0)
MCHC: 26.5 g/dL — ABNORMAL LOW (ref 30.0–36.0)
MCV: 75.4 fL — ABNORMAL LOW (ref 80.0–100.0)
Platelets: 501 10*3/uL — ABNORMAL HIGH (ref 150–400)
RBC: 3.41 MIL/uL — ABNORMAL LOW (ref 4.22–5.81)
RDW: 21.6 % — ABNORMAL HIGH (ref 11.5–15.5)
WBC: 18.9 10*3/uL — ABNORMAL HIGH (ref 4.0–10.5)
nRBC: 0.7 % — ABNORMAL HIGH (ref 0.0–0.2)

## 2022-12-20 LAB — MAGNESIUM: Magnesium: 2.4 mg/dL (ref 1.7–2.4)

## 2022-12-20 LAB — HIV ANTIBODY (ROUTINE TESTING W REFLEX): HIV Screen 4th Generation wRfx: NONREACTIVE

## 2022-12-20 LAB — PREPARE RBC (CROSSMATCH)

## 2022-12-20 LAB — PHOSPHORUS: Phosphorus: 3.7 mg/dL (ref 2.5–4.6)

## 2022-12-20 MED ORDER — SODIUM CHLORIDE 0.9% IV SOLUTION
Freq: Once | INTRAVENOUS | Status: AC
Start: 2022-12-20 — End: 2022-12-20

## 2022-12-20 MED ORDER — ADULT MULTIVITAMIN W/MINERALS CH
1.0000 | ORAL_TABLET | Freq: Every day | ORAL | Status: DC
  Administered 2022-12-20 – 2022-12-24 (×5): 1 via ORAL
  Filled 2022-12-20 (×5): qty 1

## 2022-12-20 NOTE — Progress Notes (Signed)
1 unit PRBCs ordered to be transfused for hemoglobin of 6.8K.  Will repeat CBC post blood transfusion per protocol.

## 2022-12-20 NOTE — Progress Notes (Signed)
Initial Nutrition Assessment  DOCUMENTATION CODES:   Not applicable  INTERVENTION:   Magic cup TID with meals, each supplement provides 290 kcal and 9 grams of protein. MVI with minerals daily.  NUTRITION DIAGNOSIS:   Increased nutrient needs related to wound healing as evidenced by estimated needs.  GOAL:   Patient will meet greater than or equal to 90% of their needs  MONITOR:   PO intake, Supplement acceptance  REASON FOR ASSESSMENT:   Malnutrition Screening Tool    ASSESSMENT:   67 yo male admitted with buttock abscess. PMH includes HLD, CAD, CKD, ischemic cardiomyopathy, cocaine abuse, DM-2, GERD, HTN.  S/P I&D of abscess at bedside on 12/9. Patient reports good intake of meals, no recent weight changes. Discussed good dietary sources of protein to support wound healing. He does not like PO supplements, but agreed to try magic cup supplements.   Labs reviewed. Na 133  Medications reviewed and include ferrous sulfate, folic acid, protonix, IV antibiotics.  Weight history reviewed. Patient with 18% weight loss over the past 2 weeks. Question accuracy of weights. Patient denies any recent weight loss.   NUTRITION - FOCUSED PHYSICAL EXAM:  Flowsheet Row Most Recent Value  Orbital Region No depletion  Upper Arm Region Mild depletion  Thoracic and Lumbar Region No depletion  Buccal Region No depletion  Temple Region No depletion  Clavicle Bone Region No depletion  Clavicle and Acromion Bone Region No depletion  Scapular Bone Region No depletion  Dorsal Hand No depletion  Patellar Region Unable to assess  Anterior Thigh Region Unable to assess  Posterior Calf Region Unable to assess  Edema (RD Assessment) Unable to assess  Hair Reviewed  Eyes Reviewed  Mouth Reviewed  Skin Reviewed  Nails Reviewed      Diet Order:   Diet Order             Diet heart healthy/carb modified Room service appropriate? Yes; Fluid consistency: Thin  Diet effective now                    EDUCATION NEEDS:   Education needs have been addressed  Skin:  Skin Assessment: Skin Integrity Issues: Skin Integrity Issues:: Incisions Incisions: R buttock abscess, S/P I&D 12/9  Last BM:  12/9  Height:   Ht Readings from Last 1 Encounters:  12/18/22 5\' 9"  (1.753 m)    Weight:   Wt Readings from Last 1 Encounters:  12/20/22 90.5 kg    Ideal Body Weight:  72.7 kg  BMI:  Body mass index is 29.46 kg/m.  Estimated Nutritional Needs:   Kcal:  2200-2400  Protein:  115-130 gm  Fluid:  2.2-2.4 L   Gabriel Rainwater RD, LDN, CNSC Please refer to Amion for contact information.

## 2022-12-20 NOTE — TOC Progression Note (Signed)
Transition of Care [facility]) - Progression Note    Patient Details  Name: [name] MRN: [id number] Date of Birth: 04-Nov-1955  Transition of Care [facility]) CM/SW Contact  Karn Cassis, [place] Phone Number: 12/20/2022, 8:23 AM  Clinical Narrative: Pt agreeable to HHPT/RN. No preference on agency. Referred and accepted by Art[facility] with [facility]. Added to AVS.       Expected Discharge Plan: Home w Home Health Services Barriers to Discharge: Continued Medical Work up  Expected Discharge Plan and Services In-house Referral: Clinical Social Work   Post Acute Care Choice: Resumption of Svcs/PTA Provider Living arrangements for the past 2 months: Single Family Home                           HH Arranged: RN, PT [facility] Agency: [facility] Date HH Agency Contacted: 12/20/22 Time HH Agency Contacted: [phone] Representative spoke with at [facility] Agency: Adele Dan   Social Determinants of Health (SDOH) Interventions SDOH Screenings   Food Insecurity: No Food Insecurity (12/19/2022)  Housing: Low Risk  (12/19/2022)  Transportation Needs: No Transportation Needs (12/19/2022)  Utilities: Not At Risk (12/19/2022)  Alcohol Screen: Low Risk  (12/14/2022)  Depression (PHQ2-9): Low Risk  (12/14/2022)  Financial Resource Strain: Low Risk  (12/14/2022)  Physical Activity: Inactive (12/14/2022)  Social Connections: Moderately Integrated (12/14/2022)  Stress: Stress Concern Present (12/14/2022)  Tobacco Use: High Risk (12/18/2022)  Health Literacy: Adequate Health Literacy (12/14/2022)    Readmission Risk Interventions    12/19/2022   10:56 AM 11/04/2022   10:22 AM 10/21/2022    1:05 PM  Readmission Risk Prevention Plan  Transportation Screening Complete Complete Complete  HRI or Home Care Consult  Complete Complete  Social Work Consult for Recovery Care Planning/Counseling  Complete Complete  Palliative Care Screening  Not Applicable Not Applicable  Medication Review Furniture conservator/restorer) Complete Complete Complete  HRI or Home Care Consult Complete    SW Recovery Care/Counseling Consult Complete    Palliative Care Screening Not Applicable    Skilled Nursing Facility Not Applicable

## 2022-12-20 NOTE — Progress Notes (Signed)
   12/20/22 1428  Assess: MEWS Score  Temp (!) 97.3 F (36.3 C)  BP (!) 79/51  MAP (mmHg) (!) 61  Pulse Rate 71  Resp 20  SpO2 98 %  O2 Device Nasal Cannula  O2 Flow Rate (L/min) 1 L/min  Assess: MEWS Score  MEWS Temp 0  MEWS Systolic 2  MEWS Pulse 0  MEWS RR 0  MEWS LOC 0  MEWS Score 2  MEWS Score Color Yellow  Assess: if the MEWS score is Yellow or Red  Were vital signs accurate and taken at a resting state? Yes  Does the patient meet 2 or more of the SIRS criteria? No  MEWS guidelines implemented  Yes, yellow  Treat  MEWS Interventions Considered administering scheduled or prn medications/treatments as ordered  Take Vital Signs  Increase Vital Sign Frequency  Yellow: Q2hr x1, continue Q4hrs until patient remains green for 12hrs  Escalate  MEWS: Escalate Yellow: Discuss with charge nurse and consider notifying provider and/or RRT  Notify: Charge Nurse/RN  Name of Charge Nurse/RN Notified [facility] RN  Provider Notification  Provider Name/Title Dr. Gwenlyn Perking  Date Provider Notified 12/20/22  Time Provider Notified 1430  Method of Notification Call  Notification Reason Other (Comment) (low bp)  Provider response No new orders  Date of Provider Response 12/20/22  Time of Provider Response 1432  Assess: SIRS CRITERIA  SIRS Temperature  0  SIRS Pulse 0  SIRS Respirations  0  SIRS WBC 1  SIRS Score Sum  1

## 2022-12-20 NOTE — Plan of Care (Signed)
  Problem: Education: Goal: Knowledge of General Education information will improve Description: Including pain rating scale, medication(s)/side effects and non-pharmacologic comfort measures Outcome: Progressing   Problem: Health Behavior/Discharge Planning: Goal: Ability to manage health-related needs will improve Outcome: Progressing   Problem: Clinical Measurements: Goal: Ability to maintain clinical measurements within normal limits will improve Outcome: Progressing Goal: Respiratory complications will improve Outcome: Progressing Goal: Cardiovascular complication will be avoided Outcome: Progressing   Problem: Nutrition: Goal: Adequate nutrition will be maintained Outcome: Progressing   Problem: Coping: Goal: Level of anxiety will decrease Outcome: Progressing   

## 2022-12-20 NOTE — Progress Notes (Signed)
Progress Note   Patient: [name] UYQ:[id number] DOB: December 28, 1955 DOA: 12/18/2022     1 DOS: the patient was seen and examined on 12/20/2022   Brief hospital admission course: As per H&P written by Dr. Margo Aye on 12/19/2022 [name] is a 67 y.o. male with medical history significant for chronic combined diastolic and systolic CHF, hypertension, hyperlipidemia, type 2 diabetes, diabetic polyneuropathy, iron deficiency anemia, anemia of chronic disease, CKD 3B, obesity, polysubstance abuse including cocaine, who presented to the ED after a fall in the shower.  Endorses having pain all over.  Also complains of perianal pain.   In the ED, tachycardic with soft BPs, lab studies notable for leukocytosis 24.7K.  Perianal abscesses noted on exam and confirmed on noncontrast CT abdomen and pelvis.  Code sepsis was called in the ED.  The patient was started on empiric IV antibiotics, Rocephin and IV vancomycin.  Admitted by [facility], hospitalist service.   ED Course: Temperature 99.  BP 96/59, pulse 103, respiratory 17, saturation 99% on room air.  Labs today notable for WBC 24.7, hemoglobin 7.9, platelet count 610.  Creatinine 2.05 from baseline 1.76.  Assessment and Plan: 1-sepsis secondary to perianal abscesses seen on CT scan/present at time of admission sepsis features overall improving/resolving -Continue current antibiotics -Continue as needed analgesics and follow wound care recommendations by general surgery service. -Patient is currently afebrile and lactic acidosis resolved.  2-positive blood culture -Workup demonstrating positive blood cultures with micro Ganesan of gram-positive cocci growing in aerobic bottles. -Follow speciation -Currently covered with current antibiotics.  3-acute kidney injury on chronic kidney disease 3B -Due to prerenal azotemia and sepsis physiology most likely -Maintain adequate hydration will be judicious given history of heart failure -Minimize the use  of nephrotoxic agent -Follow renal function trend -Avoid the use of contrast and hypotension.  4-anemia of chronic disease; with acute blood loss from most likely perianal abscesses -1 unit PRBCs has been ordered -Will follow hemoglobin trend -Closely monitor in case the GI service need to be involved; I just curbside at Dr. Marletta Lor but patient has recent colonoscopy and endoscopy without any significant source of upper bleeding.   5-type 2 diabetes -Recent A1c 6.3 -Continue sliding scale insulin and follow CBG fluctuation.  6-chronic combined diastolic and systolic heart failure -Appears to be compensated at the moment -Continue to follow daily weight and strict I's and O's -Recent ejection fraction 20 to 25% with a grade 2 diastolic dysfunction. -Holding on any diuretics at the moment.  7-polysubstance abuse -Patient reports keeping himself clean and away from drugs -UDS negative during this admission.  8-obesity -Body mass index is 29.46 kg/m. -Continue low calorie diet and portion control.  Subjective:  Afebrile, no chest pain, no nausea, no vomiting.  Reports generalized pain and just feeling weak and tired.  Physical Exam: Vitals:   12/20/22 1406 12/20/22 1412 12/20/22 1428 12/20/22 1732  BP: (!) 110/42 (!) 78/49 (!) 79/51 (!) 116/57  Pulse: 92 89 71 73  Resp: 16 20 20 18   Temp: 98.6 F (37 C) 98 F (36.7 C) (!) 97.3 F (36.3 C) 98.7 F (37.1 C)  TempSrc:  Axillary Axillary Oral  SpO2: 100% 100% 98% 100%  Weight:      Height:       General exam: Alert, awake, oriented x 3; afebrile; no overnight events.  Denies chest pain and orthopnea. Respiratory system: Clear to auscultation. Respiratory effort normal.  Good saturation on room air. Cardiovascular system: Rate controlled, no  rubs, no gallops, no JVD on exam. Gastrointestinal system: Abdomen is obese, nondistended, soft and nontender. No organomegaly or masses felt. Normal bowel sounds heard. Central nervous  system: No focal neurological deficits. Extremities: No cyanosis or clubbing. Skin: No petechiae. Psychiatry: Judgement and insight appear normal.  Flat affect appreciated on exam.  Data Reviewed: CBC: WBCs 18.9, hemoglobin 6.8 and platelet count 501K Magnesium: 2.4 -Lactic acid: 0.9 Basic metabolic panel: Sodium 133, potassium 4.6, chloride 103, bicarb 23, BUN 30, creatinine 2.08 and GFR 34.   Family Communication: Daughter updated over the phone.  Disposition: Status is: Inpatient Remains inpatient appropriate because: Continue IV antibiotics; follow culture results/speciation and sensitivity.  Continue to follow hemoglobin trend and transfuse as needed.  GI involvement if required for further evaluation and management depending on clinical response.   Planned Discharge Destination: Home with Home Health  Time spent: 50 minutes  Author: Vassie Loll, MD 12/20/2022 6:31 PM  For on call review [email or web address].

## 2022-12-20 NOTE — Progress Notes (Signed)
Date and time results received: 12/19/2022 2300 (use smartphrase ".now" to insert current time)  Test: positive blood cultures Critical Value: aerobic bottle, gram + cocci  Name of Provider Notified: Raphael Gibney, DO  Orders Received? Or Actions Taken?: No new orders

## 2022-12-20 NOTE — Progress Notes (Signed)
Date and time results received: 12/20/22 1715 (use smartphrase ".now" to insert current time)  Test: blood cultures aerobic bottle  Critical Value: gram positive cocci  Name of Provider Notified: Dr. Gwenlyn Perking  Orders Received? Or Actions Taken?:

## 2022-12-20 NOTE — Progress Notes (Signed)
MEWS Progress Note  Patient Details Name: RYUNOSUKE BUCHANAN MRN: [id number] DOB: 1955-04-29 Today's Date: 12/20/2022   MEWS Flowsheet Documentation:  Assess: MEWS Score Temp: 98.7 F (37.1 C) BP: (!) 116/57 MAP (mmHg): 67 Pulse Rate: 73 ECG Heart Rate: 81 Resp: 18 Level of Consciousness: Alert SpO2: 100 % O2 Device: Room Air O2 Flow Rate (L/min): 1 L/min Assess: MEWS Score MEWS Temp: 0 MEWS Systolic: 0 MEWS Pulse: 0 MEWS RR: 0 MEWS LOC: 0 MEWS Score: 0 MEWS Score Color: Green Assess: SIRS CRITERIA SIRS Temperature : 0 SIRS Respirations : 0 SIRS Pulse: 0 SIRS WBC: 0 SIRS Score Sum : 0 SIRS Temperature : 0 SIRS Pulse: 0 SIRS Respirations : 0 SIRS WBC: 0 SIRS Score Sum : 0 Assess: if the MEWS score is Yellow or Red Were vital signs accurate and taken at a resting state?: Yes Does the patient meet 2 or more of the SIRS criteria?: No MEWS guidelines implemented : Yes, yellow Treat MEWS Interventions: Considered administering scheduled or prn medications/treatments as ordered Take Vital Signs Increase Vital Sign Frequency : Yellow: Q2hr x1, continue Q4hrs until patient remains green for 12hrs Escalate MEWS: Escalate: Yellow: Discuss with charge nurse and consider notifying provider and/or RRT        Leron Croak 12/20/2022, 7:09 PM

## 2022-12-21 ENCOUNTER — Ambulatory Visit: Payer: Self-pay | Admitting: *Deleted

## 2022-12-21 ENCOUNTER — Other Ambulatory Visit: Payer: Self-pay

## 2022-12-21 DIAGNOSIS — N1832 Chronic kidney disease, stage 3b: Secondary | ICD-10-CM | POA: Insufficient documentation

## 2022-12-21 DIAGNOSIS — R7989 Other specified abnormal findings of blood chemistry: Secondary | ICD-10-CM

## 2022-12-21 DIAGNOSIS — F17200 Nicotine dependence, unspecified, uncomplicated: Secondary | ICD-10-CM | POA: Diagnosis not present

## 2022-12-21 DIAGNOSIS — K610 Anal abscess: Secondary | ICD-10-CM | POA: Diagnosis not present

## 2022-12-21 DIAGNOSIS — A419 Sepsis, unspecified organism: Secondary | ICD-10-CM | POA: Diagnosis not present

## 2022-12-21 LAB — BASIC METABOLIC PANEL
Anion gap: 12 (ref 5–15)
BUN: 32 mg/dL — ABNORMAL HIGH (ref 8–23)
CO2: 20 mmol/L — ABNORMAL LOW (ref 22–32)
Calcium: 8 mg/dL — ABNORMAL LOW (ref 8.9–10.3)
Chloride: 101 mmol/L (ref 98–111)
Creatinine, Ser: 2.04 mg/dL — ABNORMAL HIGH (ref 0.61–1.24)
GFR, Estimated: 35 mL/min — ABNORMAL LOW (ref >60)
Glucose, Bld: 202 mg/dL — ABNORMAL HIGH (ref 70–99)
Potassium: 4.3 mmol/L (ref 3.5–5.1)
Sodium: 133 mmol/L — ABNORMAL LOW (ref 135–145)

## 2022-12-21 LAB — CBC
HCT: 25.8 % — ABNORMAL LOW (ref 39.0–52.0)
Hemoglobin: 7.5 g/dL — ABNORMAL LOW (ref 13.0–17.0)
MCH: 21.9 pg — ABNORMAL LOW (ref 26.0–34.0)
MCHC: 29.1 g/dL — ABNORMAL LOW (ref 30.0–36.0)
MCV: 75.4 fL — ABNORMAL LOW (ref 80.0–100.0)
Platelets: 434 10*3/uL — ABNORMAL HIGH (ref 150–400)
RBC: 3.42 MIL/uL — ABNORMAL LOW (ref 4.22–5.81)
RDW: 21.2 % — ABNORMAL HIGH (ref 11.5–15.5)
WBC: 18.6 10*3/uL — ABNORMAL HIGH (ref 4.0–10.5)
nRBC: 1 % — ABNORMAL HIGH (ref 0.0–0.2)

## 2022-12-21 LAB — PROCALCITONIN: Procalcitonin: 0.47 ng/mL

## 2022-12-21 LAB — LACTIC ACID, PLASMA: Lactic Acid, Venous: 2.2 mmol/L (ref 0.5–1.9)

## 2022-12-21 MED ORDER — LACTATED RINGERS IV BOLUS
1000.0000 mL | Freq: Once | INTRAVENOUS | Status: AC
  Administered 2022-12-21: 1000 mL via INTRAVENOUS

## 2022-12-21 MED ORDER — MIDODRINE HCL 5 MG PO TABS
5.0000 mg | ORAL_TABLET | Freq: Once | ORAL | Status: AC
  Administered 2022-12-21: 5 mg via ORAL
  Filled 2022-12-21: qty 1

## 2022-12-21 MED ORDER — ATORVASTATIN CALCIUM 40 MG PO TABS
40.0000 mg | ORAL_TABLET | Freq: Every day | ORAL | Status: DC
  Administered 2022-12-21 – 2022-12-24 (×4): 40 mg via ORAL
  Filled 2022-12-21 (×4): qty 1

## 2022-12-21 MED ORDER — MIDODRINE HCL 5 MG PO TABS
10.0000 mg | ORAL_TABLET | Freq: Three times a day (TID) | ORAL | Status: DC
  Administered 2022-12-22 – 2022-12-24 (×7): 10 mg via ORAL
  Filled 2022-12-21 (×7): qty 2

## 2022-12-21 NOTE — Progress Notes (Signed)
  Subjective: No specific complaints.  States the drainage from his right buttock has decreased.  Objective: Vital signs in last 24 hours: Temp:  [97.3 F (36.3 C)-98.8 F (37.1 C)] 98.4 F (36.9 C) (12/11 0400) Pulse Rate:  [71-92] 82 (12/11 0400) Resp:  [16-20] 17 (12/11 0400) BP: (78-116)/(42-61) 81/51 (12/11 0400) SpO2:  [90 %-100 %] 90 % (12/11 0400) Weight:  [90.2 kg] 90.2 kg (12/11 0400) Last BM Date : 12/19/22  Intake/Output from previous day: 12/10 0701 - 12/11 0700 In: 820.8 [P.O.:240; I.V.:82.8; Blood:398; IV Piggyback:100] Out: 800 [Urine:800] Intake/Output this shift: No intake/output data recorded.  General appearance: alert, cooperative, and no distress Skin: Right buttock abscess has collapsed and no significant purulent drainage present.  Still with an indurated area more caudad and midline to the coccyx involving the left buttock, but no fluctuance is present it appears to be smaller.  Lab Results:  Recent Labs    12/20/22 0452 12/21/22 0511  WBC 18.9* 18.6*  HGB 6.8* 7.5*  HCT 25.7* 25.8*  PLT 501* 434*   BMET Recent Labs    12/20/22 0452 12/21/22 0511  NA 133* 133*  K 4.6 4.3  CL 103 101  CO2 23 20*  GLUCOSE 126* 202*  BUN 30* 32*  CREATININE 2.08* 2.04*  CALCIUM 8.1* 8.0*   PT/INR Recent Labs    12/18/22 2344  LABPROT 15.0  INR 1.2    Studies/Results: No results found.  Anti-infectives: Anti-infectives (From admission, onward)    Start     Dose/Rate Route Frequency Ordered Stop   12/20/22 0800  vancomycin (VANCOCIN) IVPB 1000 mg/200 mL premix        1,000 mg 200 mL/hr over 60 Minutes Intravenous Every 24 hours 12/19/22 0914     12/19/22 2200  cefTRIAXone (ROCEPHIN) 2 g in sodium chloride 0.9 % 100 mL IVPB        2 g 200 mL/hr over 30 Minutes Intravenous Every 24 hours 12/19/22 0854     12/19/22 1230  metroNIDAZOLE (FLAGYL) IVPB 500 mg        500 mg 100 mL/hr over 60 Minutes Intravenous Every 12 hours 12/19/22 1141      12/19/22 0400  vancomycin (VANCOCIN) IVPB 1000 mg/200 mL premix       Placed in "Followed by" Linked Group   1,000 mg 200 mL/hr over 60 Minutes Intravenous  Once 12/19/22 0221 12/19/22 1029   12/19/22 0300  vancomycin (VANCOCIN) IVPB 1000 mg/200 mL premix       Placed in "Followed by" Linked Group   1,000 mg 200 mL/hr over 60 Minutes Intravenous  Once 12/19/22 0221 12/19/22 1029   12/18/22 2345  cefTRIAXone (ROCEPHIN) 2 g in sodium chloride 0.9 % 100 mL IVPB        2 g 200 mL/hr over 30 Minutes Intravenous  Once 12/18/22 2334 12/19/22 0208      Multiple bacteria seen on Gram stain of right buttock abscess, ID pending Assessment/Plan: Successful drainage of right buttock abscess.  Still with mild induration of left buttock but not mature enough for incision and drainage at the present time.  Awaiting final microbiology ID.  Discussed with Dr. Arbutus Leas.  LOS: 2 days    [name] 12/21/2022

## 2022-12-21 NOTE — Patient Instructions (Signed)
Visit Information  Thank you for taking time to visit with me today. Please don't hesitate to contact me if I can be of assistance to you.   Following are the goals we discussed today:   Goals Addressed               This Visit's Progress     Find Help in My Community. (pt-stated)   On track     Care Coordination Interventions:  Interventions Today    Flowsheet Row Most Recent Value  Chronic Disease   Chronic disease during today's visit Other, Diabetes, Congestive Heart Failure (CHF), Hypertension (HTN), Chronic Kidney Disease/End Stage Renal Disease (ESRD)  [Meningioma with Encephalopathy & Cerebral Edema, Cocaine Abuse, Tobacco Abuse, Inability to Perform Activities of Daily Living Independently, Caregiver Burnout, Stress & Fatigue, Chronic Pain]  General Interventions   General Interventions Discussed/Reviewed General Interventions Discussed, Labs, Vaccines, Doctor Visits, Health Screening, Annual Foot Exam, General Interventions Reviewed, Annual Eye Exam, Lipid Profile, Durable Medical Equipment (DME), Community Resources, Level of Care, Communication with  [Communication with Care Team Members]  Labs Hgb A1c every 3 months, Kidney Function  [Encouraged]  Vaccines COVID-19, Flu, Pneumonia, RSV, Shingles, Tetanus/Pertussis/Diphtheria  [Encouraged]  Doctor Visits Discussed/Reviewed Doctor Visits Discussed, Specialist, Doctor Visits Reviewed, Annual Wellness Visits, PCP  [Encouraged Routine Engagement]  Health Screening Bone Density, Colonoscopy, Prostate  [Encouraged]  Durable Medical Equipment (DME) Dan Humphreys, Other  [Rollator Walker]  PCP/Specialist Visits Compliance with follow-up visit  [Encouraged Routine Engagement]  Communication with PCP/Specialists, Charity fundraiser, Pharmacists, Social Work  [employer] Routine Engagement]  Level of Care Adult Daycare, Air traffic controller, Assisted Living, Skilled Nursing Facility  [Confirmed Disinterest in Enrollment in Adult Day Care Program or Pursuing Higher  Level of Care Placement Options (I.e Assisted Living or Skilled Nursing Facility).]  Applications Medicaid, Personal Care Services  [Confirmed Interest in Reapplying for Medicaid & Personal Care Services,  Applications Mailed & Offered to Assist with Completion & Submission.]  Exercise Interventions   Exercise Discussed/Reviewed Exercise Discussed, Assistive device use and maintanence, Exercise Reviewed, Physical Activity, Weight Managment  [Encouraged]  Physical Activity Discussed/Reviewed Physical Activity Discussed, Home Exercise Program (HEP), Physical Activity Reviewed, PREP, Gym, Types of exercise  [Encouraged]  Weight Management Weight loss  [Encouraged]  Education Interventions   Education Provided Provided Therapist, sports, Provided Web-based Education, Provided Education  [Encouraged Review.]  Provided Engineer, petroleum On Nutrition, Mental Health/Coping with Illness, When to see the doctor, Foot Care, Eye Care, Labs, Blood Sugar Monitoring, Applications, Exercise, Medication, [employer], Development worker, community  [Encouraged Consideration & Implementation.]  Labs Reviewed --  [Low Hemoglobin Level,  Encouraged to Follow-Up with Primary Care Provider.]  Applications Medicaid, Personal Care Services  [Confirmed Interest in Reapplying for Medicaid & Personal Care Services,  Applications Mailed & Offered to Assist with Completion & Submission.]  Mental Health Interventions   Mental Health Discussed/Reviewed Mental Health Discussed, Anxiety, Depression, Grief and Loss, Mental Health Reviewed, Substance Abuse, Coping Strategies, Suicide, Crisis, Other  [Assessed Mental Health Status & Cognitive Status.]  Nutrition Interventions   Nutrition Discussed/Reviewed Nutrition Discussed, Adding fruits and vegetables, Increasing proteins, Decreasing fats, Nutrition Reviewed, Fluid intake, Decreasing salt, Portion sizes, Carbohydrate meal planning, Decreasing sugar intake  [Encouraged]  Pharmacy  Interventions   Pharmacy Dicussed/Reviewed Pharmacy Topics Discussed, Medications and their functions, Medication Adherence, Pharmacy Topics Reviewed, Affording Medications  [Discussed Importance of Compliance.]  Medication Adherence --  [Confirmed Compliance with Medications, Administered by Orest Dikes Hicks.]  Referral to Pharmacist --  [Confirmed Ability to [facility] Prescription  Medications.]  Safety Interventions   Safety Discussed/Reviewed Safety Discussed, Safety Reviewed, Fall Risk, Home Safety  [Fall Risk, Unsteady Balance/Gait, History of Falls & Encouraged Routine Use of Assistive Devices.]  Home Safety Assistive Devices, Need for home safety assessment, Contact provider for referral to PT/OT, Refer for community resources, Refer for home visit  [Encouraged Consideration of Home Safety Evaluation,  Request Placed for Orders for Home Health Physical & Occupational Therapy Services,  Encouraged Routine Use of Rollator Walker]  Advanced Directive Interventions   Advanced Directives Discussed/Reviewed Advanced Directives Discussed, Advanced Directives Reviewed  [Encouraged Initiation of Advanced Directives, Offering to [employer] & Assist with Completion.]      Active Listening & Reflection Utilized. Verbalization of Feelings Encouraged. Emotional Support Provided. Problem Solving Interventions Activated. Task-Centered Solutions Employed.   Solution-Focused Strategies Implemented. Acceptance & Commitment Therapy Initiated. Cognitive Behavioral Therapy Performed. Client-Centered Therapy Conducted. CSW Collaboration with Daughter, Mukesh Wunschel to Confirm Patient's Current Hospitalization at [facility], While Awaiting Physical & Occupational Therapy Consults, Recommendations & Evaluations, Insurance Authorization & Approval for Skilled Nursing Facility Placement to Receive Short-Term Rehabilitative Services. CSW Collaboration with Daughter, Kolsen Iley to Confirm  Interest in Skilled Nursing Facility Placement for Patient at [facility] [phone]) or [facility] [phone]). CSW Collaboration with Daughter, Jaxxton Steidinger to Confirm Interest in Reinstating Personal Care Services, through [facility] [phone]), Upon Patient's Return Home to Live Independently.  CSW Collaboration with Daughter, Dontario Przybylski to Encourage Routine Engagement with Danford Bad, Licensed Clinical Social Worker with [facility] [phone]), if She Has Questions, Needs Assistance, or If Additional Social Work Needs Are Identified Between Now & Our Next Follow-Up Outreach Call, Scheduled on 01/02/2023 at 12:00 PM.      Our next appointment is by telephone on 01/02/2023 at 12:00 pm.  Please call the care guide team at [phone] if you need to cancel or reschedule your appointment.   If you are experiencing a Mental Health or Behavioral Health Crisis or need someone to talk to, please call the Suicide and Crisis Lifeline: 988 call the [place] National Suicide Prevention Lifeline: 7734145646 or TTY: (269)019-1651 TTY 239-324-9423) to talk to a trained counselor call 1-800-273-TALK (toll free, 24 hour hotline) go to [facility] [address], [place] [phone]) call the [facility] Crisis Line: [phone] call 911  Patient verbalizes understanding of instructions and care plan provided today and agrees to view in MyChart. Active MyChart status and patient understanding of how to access instructions and care plan via MyChart confirmed with patient.     Telephone follow up appointment with care management team member scheduled for:  01/02/2023 at 12:00 pm.  Danford Bad, BSW, MSW, LCSW  Embedded Practice Social Work Case Manager  [facility], Population Health Direct Dial: [phone]   Fax: [phone] Email: [email or web address]: [email or web address]

## 2022-12-21 NOTE — Progress Notes (Signed)
Received call from pt's daughter Shlomy Palacio who states that she received a call from her father. He told her that someone came into his room and "disrespected" him. It is noted that pt's nurse was attempting to restart his IV earlier and pt became upset that IV attempt x2 was unsuccessful and was refusing further attempts.  I advised daughter that I would speak with patient regarding incident and would contact SWOT nurse to come and restart IV. Pt's daughter states that she would call her father back and advise him of plan and encourage him to cooperate with care. Daughter asks that staff call her anytime her father becomes difficult or refusing care to call her so that she can talk with him. Attending nurse notified of same.

## 2022-12-21 NOTE — Hospital Course (Addendum)
67 y.o. male with a history of CAD, tobacco use, HTN, HLD, T2DM, GERD, history of cocaine use, OSA, and combined CHF (LVEF 20-25%, G2DD), DM polyneuropathy, iron deficiency anemia who presented.  after a fall in the shower. Endorses having pain all over. Also complains of perianal pain.   In the ED, tachycardic with soft BPs, lab studies notable for leukocytosis 24.7K.  Perianal abscesses noted on exam and confirmed on noncontrast CT abdomen and pelvis.  Code sepsis was called in the ED.  The patient was started on empiric IV antibiotics, Rocephin and IV vancomycin.  Admitted by [facility], hospitalist service.   ED Course: Temperature 99.  BP 96/59, pulse 103, respiratory 17, saturation 99% on room air.  Labs today notable for WBC 24.7, hemoglobin 7.9, platelet count 610.  Creatinine 2.05 from baseline 1.76.  Notably, the patient was recently mated to the hospital from 11/02/2022 to 11/15/2022 for respiratory failure secondary to acute on chronic HFrEF.  During that hospitalization, the patient was also seen by GI for acute on chronic anemia.  He underwent EGD and colonoscopy on 11/11/2022.  EGD showed erythematous mucosa in the antrum and duodenitis.  Colonoscopy showed 3 polyps in the transverse colon, 1 in the ascending colon.  His iron deficiency was felt to be due to large polyps of 1 with visible oozing.  He was given Venofer and started on ferrous sulfate at time of discharge.  General surgery was consulted.  Bedside debridement was performed on the right perianal abscess.  Cultures were sent.  The patient was started vancomycin, ceftriaxone, and metronidazole.  Left gluteal lesion was also incised but there was not abscess.

## 2022-12-21 NOTE — Progress Notes (Addendum)
Patient has no IV access due to patient pulling it out. This nurse attempted twice for an IV and asked another nurse to try. Patient is refusing anybody else attempting at this time. Will try again after patient eats. MD tat aware.

## 2022-12-21 NOTE — Progress Notes (Signed)
PROGRESS NOTE  [name] JYN:[id number] DOB: [birth year] DOA: 12/18/2022 PCP: Benetta Spar, MD  Brief History:  67 y.o. male with a history of CAD, tobacco use, HTN, HLD, T2DM, GERD, history of cocaine use, OSA, and combined CHF (LVEF 20-25%, G2DD), DM polyneuropathy, iron deficiency anemia who presented.  after a fall in the shower. Endorses having pain all over. Also complains of perianal pain.   In the ED, tachycardic with soft BPs, lab studies notable for leukocytosis 24.7K.  Perianal abscesses noted on exam and confirmed on noncontrast CT abdomen and pelvis.  Code sepsis was called in the ED.  The patient was started on empiric IV antibiotics, Rocephin and IV vancomycin.  Admitted by [facility], hospitalist service.   ED Course: Temperature 99.  BP 96/59, pulse 103, respiratory 17, saturation 99% on room air.  Labs today notable for WBC 24.7, hemoglobin 7.9, platelet count 610.  Creatinine 2.05 from baseline 1.76.  Notably, the patient was recently mated to the hospital from 11/02/2022 to 11/15/2022 for respiratory failure secondary to acute on chronic HFrEF.  During that hospitalization, the patient was also seen by GI for acute on chronic anemia.  He underwent EGD and colonoscopy on 11/11/2022.  EGD showed erythematous mucosa in the antrum and duodenitis.  Colonoscopy showed 3 polyps in the transverse colon, 1 in the ascending colon.  His iron deficiency was felt to be due to large polyps of 1 with visible oozing.  He was given Venofer and started on ferrous sulfate at time of discharge.  General surgery was consulted.  Bedside debridement was performed on the right perianal abscess.  Cultures were sent.  The patient was started vancomycin, ceftriaxone, and metronidazole.   Assessment/Plan: Severe Sepsis -Presented with tachycardia and leukocytosis and elevated lactic acid -Secondary to perirectal abscess -general surgery following -s/p bedside I&D on right  abscess -12/19/22 CT AP--bilateral buttock abscess R>L -BPs remain soft -increase midodrine to 10 mg TID -give one liter LR  Perirectal abscess -general surgery following -s/p bedside I&D on right abscess -12/19/22 CT AP--bilateral buttock abscess R>L -12/19/22 culture--flora -continue vanc/ceftriaxone/metronidazole  Bacteremia -represents contaminant -diphtheroids in 1/2 sets -staph epi in 1/2 sets  Chronic HFrEF -clinically euvolemic -unable to restart coreg due to soft BPs -10/21/22 Echo--EF 20-25%, G2DD, mild MR -transitioned to po torsemide  20 mg daily at time of last d/c  CKD 3b -baseline creatinine 1.7-1,9 -monitor serial BMP  Meningioma -12/19/22 CT brain--stable edema right temporal lobe possibly related to known right middle cranial fossa meningioma  Iron Deficiency Anemia -baseline Hgb ~8 -11/08/22 iron saturation 8%, ferritin 13  -11/08/22 folate 7.4--continue folate supplementation -11/1 EGD--ertythematous mucosa in antrum, +duodenitis -11/1 colonoscopy--3 polyps in transverse colon, one in ascending colon;  iron deficiency due to large polyps with one with visible oozing -transfused one unite PRBC 12/10 -repeat iron studies -continue pantoprazole -no signs of active bleeding  Polysubstance abuse -12/9 UDS negative -Patient continues to smoke -Tobacco cessation discussed  Diabetes mellitus type 2 with hyperglycemia --10/21/22 HbA1c was 6.3%  -start novolog sliding scale  CAD -No chest pain. Later did have atypical chest pain likely musculoskeletal with persistently negative troponin and no ischemic ECG changes.  - Continue ASA, statin, BB  OSA:  - CPAP at bedtime   Peripheral neuropathy:  - Continue gabapentin  PAD: Chronic left common iliac occlusion on CTA with distal reconstution.  - Needs vascular surgery evaluation as outpatient.  Family Communication:  no Family at bedside  Consultants:  general surgery  Code Status:  FULL    DVT Prophylaxis:  Rosalia Heparin    Procedures: As Listed in Progress Note Above  Antibiotics: Vanc 12/9>> Ceftriaxone 12/9>> Flagyl 12/9>>      Subjective:  Patient denies fevers, chills, headache, chest pain, dyspnea, nausea, vomiting, diarrhea, abdominal pain, dysuria, hematuria, hematochezia, and melena.  Objective: Vitals:   12/20/22 1941 12/21/22 0400 12/21/22 1400 12/21/22 1507  BP: 92/61 (!) 81/51 (!) 86/51 (!) 87/49  Pulse: 80 82 82 72  Resp: 17 17 18 18   Temp: 98.4 F (36.9 C) 98.4 F (36.9 C) 97.7 F (36.5 C) 97.8 F (36.6 C)  TempSrc: Oral Oral Oral Oral  SpO2: 97% 90% 100% 100%  Weight:  90.2 kg    Height:        Intake/Output Summary (Last 24 hours) at 12/21/2022 1651 Last data filed at 12/21/2022 1538 Gross per 24 hour  Intake 1060.83 ml  Output 400 ml  Net 660.83 ml   Weight change: -0.3 kg Exam:  General:  Pt is alert, follows commands appropriately, not in acute distress HEENT: No icterus, No thrush, No neck mass, [place]/AT Cardiovascular: RRR, S1/S2, no rubs, no gallops Respiratory: bibasilar crackles.  No wheeze Abdomen: Soft/+BS, non tender, non distended, no guarding Extremities: No edema, No lymphangitis, No petechiae, No rashes, no synovitis   Data Reviewed: I have personally reviewed following labs and imaging studies Basic Metabolic Panel: Recent Labs  Lab 12/18/22 2344 12/19/22 0437 12/20/22 0452 12/21/22 0511  NA 136 136 133* 133*  K 4.8 4.8 4.6 4.3  CL 100 101 103 101  CO2 26 24 23  20*  GLUCOSE 125* 151* 126* 202*  BUN 29* 28* 30* 32*  CREATININE 2.05* 1.96* 2.08* 2.04*  CALCIUM 8.9 8.8* 8.1* 8.0*  MG  --  2.6* 2.4  --   PHOS  --  3.8 3.7  --    Liver Function Tests: Recent Labs  Lab 12/18/22 2344  AST 14*  ALT 11  ALKPHOS 128*  BILITOT 0.7  PROT 7.9  ALBUMIN 2.9*   No results for input(s): "LIPASE", "AMYLASE" in the last 168 hours. No results for input(s): "AMMONIA" in the last 168 hours. Coagulation  Profile: Recent Labs  Lab 12/18/22 2344  INR 1.2   CBC: Recent Labs  Lab 12/18/22 2344 12/19/22 0437 12/20/22 0452 12/21/22 0511  WBC 24.7* 24.2* 18.9* 18.6*  NEUTROABS 21.0*  --   --   --   HGB 7.9* 7.8* 6.8* 7.5*  HCT 29.6* 29.6* 25.7* 25.8*  MCV 76.1* 76.9* 75.4* 75.4*  PLT 610* 604* 501* 434*   Cardiac Enzymes: Recent Labs  Lab 12/18/22 2344  CKTOTAL 88   BNP: Invalid input(s): "POCBNP" CBG: Recent Labs  Lab 12/18/22 2327  GLUCAP 122*   HbA1C: No results for input(s): "HGBA1C" in the last 72 hours. Urine analysis:    Component Value Date/Time   COLORURINE YELLOW 12/19/2022 0738   APPEARANCEUR CLEAR 12/19/2022 0738   APPEARANCEUR Clear 08/02/2021 1051   LABSPEC 1.014 12/19/2022 0738   PHURINE 6.0 12/19/2022 0738   GLUCOSEU >=500 (A) 12/19/2022 0738   HGBUR NEGATIVE 12/19/2022 0738   BILIRUBINUR NEGATIVE 12/19/2022 0738   BILIRUBINUR Negative 08/02/2021 1051   KETONESUR NEGATIVE 12/19/2022 0738   PROTEINUR 30 (A) 12/19/2022 0738   UROBILINOGEN 0.2 09/27/2014 2237   NITRITE NEGATIVE 12/19/2022 0738   LEUKOCYTESUR NEGATIVE 12/19/2022 0738   Sepsis Labs: @LABRCNTIP (procalcitonin:4,lacticidven:4) ) Recent Results (  from the past 240 hour(s))  Resp panel by RT-PCR (RSV, Flu A&B, Covid) Anterior Nasal Swab     Status: None   Collection Time: 12/18/22 11:44 PM   Specimen: Anterior Nasal Swab  Result Value Ref Range Status   SARS Coronavirus 2 by RT PCR NEGATIVE NEGATIVE Final    Comment: (NOTE) SARS-CoV-2 target nucleic acids are NOT DETECTED.  The SARS-CoV-2 RNA is generally detectable in upper respiratory specimens during the acute phase of infection. The lowest concentration of SARS-CoV-2 viral copies this assay can detect is 138 copies/mL. A negative result does not preclude SARS-Cov-2 infection and should not be used as the sole basis for treatment or other patient management decisions. A negative result may occur with  improper specimen  collection/handling, submission of specimen other than nasopharyngeal swab, presence of viral mutation(s) within the areas targeted by this assay, and inadequate number of viral copies(<138 copies/mL). A negative result must be combined with clinical observations, patient history, and epidemiological information. The expected result is Negative.  Fact Sheet for Patients:  BloggerCourse.com  Fact Sheet for Healthcare Providers:  SeriousBroker.it  This test is no t yet approved or cleared by the [place] FDA and  has been authorized for detection and/or diagnosis of SARS-CoV-2 by FDA under an Emergency Use Authorization (EUA). This EUA will remain  in effect (meaning this test can be used) for the duration of the COVID-19 declaration under Section 564(b)(1) of the Act, 21 U.S.C.section 360bbb-3(b)(1), unless the authorization is terminated  or revoked sooner.       Influenza A by PCR NEGATIVE NEGATIVE Final   Influenza B by PCR NEGATIVE NEGATIVE Final    Comment: (NOTE) The Xpert Xpress SARS-CoV-2/FLU/RSV plus assay is intended as an aid in the diagnosis of influenza from Nasopharyngeal swab specimens and should not be used as a sole basis for treatment. Nasal washings and aspirates are unacceptable for Xpert Xpress SARS-CoV-2/FLU/RSV testing.  Fact Sheet for Patients: BloggerCourse.com  Fact Sheet for Healthcare Providers: SeriousBroker.it  This test is not yet approved or cleared by the [place] FDA and has been authorized for detection and/or diagnosis of SARS-CoV-2 by FDA under an Emergency Use Authorization (EUA). This EUA will remain in effect (meaning this test can be used) for the duration of the COVID-19 declaration under Section 564(b)(1) of the Act, 21 U.S.C. section 360bbb-3(b)(1), unless the authorization is terminated or revoked.     Resp Syncytial  Virus by PCR NEGATIVE NEGATIVE Final    Comment: (NOTE) Fact Sheet for Patients: BloggerCourse.com  Fact Sheet for Healthcare Providers: SeriousBroker.it  This test is not yet approved or cleared by the [place] FDA and has been authorized for detection and/or diagnosis of SARS-CoV-2 by FDA under an Emergency Use Authorization (EUA). This EUA will remain in effect (meaning this test can be used) for the duration of the COVID-19 declaration under Section 564(b)(1) of the Act, 21 U.S.C. section 360bbb-3(b)(1), unless the authorization is terminated or revoked.  Performed at [facility], [address], [place], [place] [address]   Blood Culture (routine x 2)     Status: Abnormal (Preliminary result)   Collection Time: 12/18/22 11:44 PM   Specimen: Right Antecubital; Blood  Result Value Ref Range Status   Specimen Description   Final    RIGHT ANTECUBITAL Performed at [facility], [address], [place], [place] [address]    Special Requests   Final    BOTTLES DRAWN AEROBIC AND ANAEROBIC Blood Culture adequate volume Performed  at [facility], [address], [place], [place] [address]    Culture  Setup Time   Final    GRAM POSITIVE RODS AEROBIC BOTTLE ONLY CRITICAL RESULT CALLED TO, READ BACK BY AND VERIFIED WITH: ANGEL Ray at 1705 12/20/2022 by T KENNEDY PREVIOUSLY REPORTED AS: GRAM POSITIVE COCCI CORRECTED RESULTS CALLED TO: RN Ethelene Hal [phone] @ 2247 FH    Culture (A)  Final    DIPHTHEROIDS(CORYNEBACTERIUM SPECIES) Standardized susceptibility testing for this organism is not available. Performed at [facility], [address], [place], [place] [address]    Report Status PENDING  Incomplete  Blood Culture (routine x 2)     Status: Abnormal (Preliminary result)   Collection Time: 12/19/22 12:15 AM   Specimen: BLOOD LEFT HAND  Result Value Ref Range Status   Specimen Description   Final    BLOOD LEFT  HAND Performed at [facility], [address] [place], [place] [address]    Special Requests   Final    BOTTLES DRAWN AEROBIC AND ANAEROBIC Blood Culture adequate volume Performed at [facility], [address], [place], [place] [address]    Culture  Setup Time   Final    GRAM POSITIVE COCCI AEROBIC BOTTLE Gram Stain Report Called to,Read Back By and Verified With: VILLALOBOS,S ON 12/19/22 AT 2300 BY LOY,C Organism ID to follow CRITICAL RESULT CALLED TO, READ BACK BY AND VERIFIED WITH: S VILLALOBOS,RN@0340  12/20/22 MK    Culture (A)  Final    STAPHYLOCOCCUS EPIDERMIDIS THE SIGNIFICANCE OF ISOLATING THIS ORGANISM FROM A SINGLE SET OF BLOOD CULTURES WHEN MULTIPLE SETS ARE DRAWN IS UNCERTAIN. PLEASE NOTIFY THE MICROBIOLOGY DEPARTMENT WITHIN ONE WEEK IF SPECIATION AND SENSITIVITIES ARE REQUIRED. Performed at [facility], [address] [place], [place] [address]    Report Status PENDING  Incomplete  Blood Culture ID Panel (Reflexed)     Status: Abnormal   Collection Time: 12/19/22 12:15 AM  Result Value Ref Range Status   Enterococcus faecalis NOT DETECTED NOT DETECTED Final   Enterococcus Faecium NOT DETECTED NOT DETECTED Final   Listeria monocytogenes NOT DETECTED NOT DETECTED Final   Staphylococcus species DETECTED (A) NOT DETECTED Final    Comment: CRITICAL RESULT CALLED TO, READ BACK BY AND VERIFIED WITH: S VILLALOBOS,RN@0340  12/20/22 MK    Staphylococcus aureus (BCID) NOT DETECTED NOT DETECTED Final   Staphylococcus epidermidis DETECTED (A) NOT DETECTED Final    Comment: Methicillin (oxacillin) resistant coagulase negative staphylococcus. Possible blood culture contaminant (unless isolated from more than one blood culture draw or clinical case suggests pathogenicity). No antibiotic treatment is indicated for blood  culture contaminants. CRITICAL RESULT CALLED TO, READ BACK BY AND VERIFIED WITH: S VILLALOBOS,RN@0340  12/20/22 MK    Staphylococcus lugdunensis NOT DETECTED  NOT DETECTED Final   Streptococcus species NOT DETECTED NOT DETECTED Final   Streptococcus agalactiae NOT DETECTED NOT DETECTED Final   Streptococcus pneumoniae NOT DETECTED NOT DETECTED Final   Streptococcus pyogenes NOT DETECTED NOT DETECTED Final   A.calcoaceticus-baumannii NOT DETECTED NOT DETECTED Final   Bacteroides fragilis NOT DETECTED NOT DETECTED Final   Enterobacterales NOT DETECTED NOT DETECTED Final   Enterobacter cloacae complex NOT DETECTED NOT DETECTED Final   Escherichia coli NOT DETECTED NOT DETECTED Final   Klebsiella aerogenes NOT DETECTED NOT DETECTED Final   Klebsiella oxytoca NOT DETECTED NOT DETECTED Final   Klebsiella pneumoniae NOT DETECTED NOT DETECTED Final   Proteus species NOT DETECTED NOT DETECTED Final   Salmonella species NOT DETECTED NOT  DETECTED Final   Serratia marcescens NOT DETECTED NOT DETECTED Final   Haemophilus influenzae NOT DETECTED NOT DETECTED Final   Neisseria meningitidis NOT DETECTED NOT DETECTED Final   Pseudomonas aeruginosa NOT DETECTED NOT DETECTED Final   Stenotrophomonas maltophilia NOT DETECTED NOT DETECTED Final   Candida albicans NOT DETECTED NOT DETECTED Final   Candida auris NOT DETECTED NOT DETECTED Final   Candida glabrata NOT DETECTED NOT DETECTED Final   Candida krusei NOT DETECTED NOT DETECTED Final   Candida parapsilosis NOT DETECTED NOT DETECTED Final   Candida tropicalis NOT DETECTED NOT DETECTED Final   Cryptococcus neoformans/gattii NOT DETECTED NOT DETECTED Final   Methicillin resistance mecA/C DETECTED (A) NOT DETECTED Final    Comment: CRITICAL RESULT CALLED TO, READ BACK BY AND VERIFIED WITH: S VILLALOBOS,RN@0340  12/20/22 MK Performed at [facility], [address], [place], [place] [address]   MRSA Next Gen by PCR, Nasal     Status: None   Collection Time: 12/19/22  5:07 AM   Specimen: Nasal Mucosa; Nasal Swab  Result Value Ref Range Status   MRSA by PCR Next Gen NOT DETECTED NOT DETECTED Final     Comment: (NOTE) The GeneXpert MRSA Assay (FDA approved for NASAL specimens only), is one component of a comprehensive MRSA colonization surveillance program. It is not intended to diagnose MRSA infection nor to guide or monitor treatment for MRSA infections. Test performance is not FDA approved in patients less than 33 years old. Performed at [facility], [address], [place], [place] [address]   Aerobic Culture w Gram Stain (superficial specimen)     Status: None (Preliminary result)   Collection Time: 12/19/22  2:40 PM   Specimen: Abscess  Result Value Ref Range Status   Specimen Description   Final    ABSCESS Performed at [facility], [address] [place], [place] [address]    Special Requests   Final    BUTTOCKS Performed at [facility], [address] [place], [place] [address]    Gram Stain   Final    FEW WBC PRESENT,BOTH PMN AND MONONUCLEAR RARE GRAM POSITIVE COCCI IN PAIRS RARE GRAM POSITIVE COCCI IN CHAINS    Culture   Final    RARE NORMAL SKIN FLORA Performed at [facility], [address], [place], [place] [address]    Report Status PENDING  Incomplete     Scheduled Meds:  ferrous sulfate  325 mg Oral Q breakfast   folic acid  1 mg Oral Daily   gabapentin  100 mg Oral TID   heparin  5,000 Units Subcutaneous Q8H   linaclotide  145 mcg Oral QAC breakfast   [START ON 12/22/2022] midodrine  10 mg Oral TID WC   midodrine  5 mg Oral Once   multivitamin with minerals  1 tablet Oral Daily   pantoprazole  40 mg Oral Daily   Continuous Infusions:  cefTRIAXone (ROCEPHIN)  IV 2 g (12/20/22 2146)   lactated ringers     metronidazole 500 mg (12/21/22 1221)   vancomycin 1,000 mg (12/21/22 0852)    Procedures/Studies: [place] EKG SITE RITE  Result Date: 12/21/2022 If Site Rite image not attached, placement could not be confirmed due to current cardiac rhythm.  CT CHEST ABDOMEN PELVIS WO CONTRAST  Result Date: 12/19/2022 CLINICAL DATA:  Fall, shortness of  breath, buttock abscesses EXAM: CT CHEST, ABDOMEN AND PELVIS WITHOUT CONTRAST TECHNIQUE: Multidetector CT imaging of the chest, abdomen and pelvis was performed following the  standard protocol without IV contrast. RADIATION DOSE REDUCTION: This exam was performed according to the departmental dose-optimization program which includes automated exposure control, adjustment of the mA and/or kV according to patient size and/or use of iterative reconstruction technique. COMPARISON:  CTA abdomen/pelvis dated 11/02/2022 FINDINGS: CT CHEST FINDINGS Cardiovascular: Cardiomegaly. No pericardial effusion. Hypodense blood pool relative to myocardium, suggesting anemia. No evidence of thoracic aortic aneurysm. Atherosclerotic calcifications of the arch. Severe three-vessel coronary atherosclerosis. Mediastinum/Nodes: Small mediastinal nodes, measuring up to 12 mm short axis in the right paratracheal region (series 2/image 14), previously 17 mm, likely reactive. Visualized thyroid is unremarkable. Lungs/Pleura: Mosaic attenuation with mild scarring/atelectasis in the lungs bilaterally. No focal consolidation. No suspicious pulmonary nodules. No pleural effusion or pneumothorax. Musculoskeletal: Degenerative changes of the thoracic spine. Sternum, clavicles, and scapulae are intact. Bilateral ribs are intact. CT ABDOMEN PELVIS FINDINGS Hepatobiliary: Unenhanced liver is unremarkable. Layering tiny gallstones (series 2/image 58), without associated inflammatory changes. No intrahepatic or extrahepatic ductal dilatation. Pancreas: Within normal limits. Spleen: Within normal limits. Adrenals/Urinary Tract: Adrenal glands are within normal limits. Kidneys are within normal limits.  No hydronephrosis. Bladder is within normal limits. Stomach/Bowel: Stomach is within normal limits. No evidence of bowel obstruction. Normal appendix/appendiceal stump (series 2/image 84). No colonic wall thickening or inflammatory changes.  Vascular/Lymphatic: No evidence of abdominal aortic aneurysm. Atherosclerotic calcifications of the abdominal aorta and branch vessels. Left external iliac stent. No suspicious abdominopelvic lymphadenopathy. Reproductive: Prostate is unremarkable. Other: No abdominopelvic ascites. Musculoskeletal: 3.1 x 2.8 cm fluid collection with trace nondependent gas along the left medial gluteal cleft (series 2/image 122). 6.5 x 3.4 cm fluid collection inferiorly along the right medial gluteal cleft (series 2/image 135). These likely correspond to the patient's known buttock abscesses. Mild degenerative changes of the lumbar spine.  No fracture is seen. IMPRESSION: No traumatic injury to the chest, abdomen, or pelvis. Bilateral buttock abscesses, right greater than left. Additional ancillary findings as above. Electronically Signed   By: Charline Bills M.D.   On: 12/19/2022 00:19   DG Chest Port 1 View  Result Date: 12/19/2022 CLINICAL DATA:  Questionable sepsis - evaluate for abnormality EXAM: PORTABLE CHEST 1 VIEW COMPARISON:  11/28/2022 FINDINGS: Cardiomegaly. Vascular congestion. Low lung volumes with bibasilar atelectasis. No overt edema or effusions. No acute bony abnormality. IMPRESSION: Cardiomegaly, vascular congestion. Low lung volumes with bibasilar atelectasis. Electronically Signed   By: Charlett Nose M.D.   On: 12/19/2022 00:19   CT Cervical Spine Wo Contrast  Result Date: 12/19/2022 CLINICAL DATA:  Fall.  Neck trauma (Age >= 65y) EXAM: CT CERVICAL SPINE WITHOUT CONTRAST TECHNIQUE: Multidetector CT imaging of the cervical spine was performed without intravenous contrast. Multiplanar CT image reconstructions were also generated. RADIATION DOSE REDUCTION: This exam was performed according to the departmental dose-optimization program which includes automated exposure control, adjustment of the mA and/or kV according to patient size and/or use of iterative reconstruction technique. COMPARISON:  11/07/2021  FINDINGS: Alignment: Normal Skull base and vertebrae: No acute fracture. No primary bone lesion or focal pathologic process. Soft tissues and spinal canal: No prevertebral fluid or swelling. No visible canal hematoma. Disc levels: Mild diffuse degenerative disc disease and facet disease. Upper chest: No acute findings Other: None IMPRESSION: No acute bony abnormality. Electronically Signed   By: Charlett Nose M.D.   On: 12/19/2022 00:14   CT HEAD WO CONTRAST ( )  Result Date: 12/19/2022 CLINICAL DATA:  Fall.  Head trauma, minor (Age >= 65y) EXAM: CT HEAD WITHOUT CONTRAST TECHNIQUE:  Contiguous axial images were obtained from the base of the skull through the vertex without intravenous contrast. RADIATION DOSE REDUCTION: This exam was performed according to the departmental dose-optimization program which includes automated exposure control, adjustment of the mA and/or kV according to patient size and/or use of iterative reconstruction technique. COMPARISON:  11/28/2022, MRI 11/06/2022 FINDINGS: Brain: There is atrophy and chronic small vessel disease changes. Low-density/edema again noted in the right temporal lobe, unchanged. 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 No acute infarct, hemorrhage or hydrocephalus. Vascular: No hyperdense vessel or unexpected calcification. Skull: No acute calvarial abnormality. Sinuses/Orbits: No acute findings Other: None IMPRESSION: Stable edema within the right temporal lobe, possibly related to known right middle cranial fossa meningioma, not seen on this study. Old infarcts as above. Atrophy, chronic microvascular disease. No acute intracranial abnormality. Electronically Signed   By: Charlett Nose M.D.   On: 12/19/2022 00:13   CT Head Wo Contrast  Result Date: 11/28/2022 CLINICAL DATA:  Mental status change, alcohol/drug use EXAM: CT HEAD WITHOUT CONTRAST TECHNIQUE: Contiguous axial images were obtained from the base of the skull through  the vertex without intravenous contrast. RADIATION DOSE REDUCTION: This exam was performed according to the departmental dose-optimization program which includes automated exposure control, adjustment of the mA and/or kV according to patient size and/or use of iterative reconstruction technique. COMPARISON:  Head CT 06/30/2022, brain MR 11/06/22 FINDINGS: Brain: Persistent nonspecific edema in the right temporal lobe. Known meningioma in the right middle cranial fossa is not visualized on this exam due to CT technique. No hemorrhage. No hydrocephalus. No extra-axial fluid collection. No CT evidence of cortical infarct. Unchanged chronic infarcts in the right occipital lobe and left thalamus Vascular: No hyperdense vessel or unexpected calcification. Skull: Normal. Negative for fracture or focal lesion. Sinuses/Orbits: No middle ear or mastoid effusion. Paranasal sinuses are clear. Orbits are unremarkable. Other: None IMPRESSION: 1. Persistent nonspecific edema in the right temporal lobe. No hemorrhage. 2. Known meningioma in the right middle cranial fossa is not visualized on this exam due to CT technique. 3. Unchanged chronic infarcts in the right occipital lobe and left thalamus. Electronically Signed   By: Lorenza Cambridge M.D.   On: 11/28/2022 19:53   DG Chest 2 View  Result Date: 11/28/2022 CLINICAL DATA:  Shortness of breath. EXAM: CHEST - 2 VIEW COMPARISON:  Chest radiograph dated October 18, 2022. FINDINGS: The heart size is enlarged. The mediastinal contours are within normal limits. Bilateral interstitial prominence, most pronounced at the lung bases. No focal consolidation. No pleural effusion or pneumothorax. No acute osseous abnormality. IMPRESSION: Cardiomegaly with findings suggestive of pulmonary vascular congestion. Electronically Signed   By: Hart Robinsons M.D.   On: 11/28/2022 19:22    Catarina Hartshorn, DO  [facility]  If 7PM-7AM, please contact night-coverage [email or web address] Password  [email or web address] 12/21/2022, 4:51 PM   LOS: 2 days

## 2022-12-21 NOTE — Progress Notes (Signed)
Pt refused to get out of bed this am to be weighed

## 2022-12-21 NOTE — Patient Outreach (Signed)
  Care Coordination   Follow Up Visit Note   12/21/2022 Name: [name] MRN: [id number] DOB: [birth year]  [name] is a 67 y.o. year old male who sees [name], [name], MD for primary care. I  spoke with patient's daughter, [name], by telephone today.   What matters to the patients health and wellness today?  Did not speak with patient. Daughter would like patient discharged from hospital to SNF for short term rehab instead of home with HHPT.    Goals Addressed             This Visit's Progress    Care Management Needs   Not on track    Care Management Goals: Patient to be discharged home from inpatient stay with HHPT and [facility] services through [facility] After discharge: Patient will keep all medical appointments Patient will reach out to provider with any new or worsening symptoms Patient will take medication as prescribed Patient will obtain a blood pressure monitor Patient will obtain a glucose meter Patient will obtain scales Patient will obtain shower chair Patient will use rolling walker for ambulation Patient will work with HHPT for strengthening and gait training Patient/caregiver will reach out to RN Care Manager at [phone] as needed        SDOH assessments and interventions completed:  No     Care Coordination Interventions:  Yes, provided  Interventions Today    Flowsheet Row Most Recent Value  Chronic Disease   Chronic disease during today's visit Congestive Heart Failure (CHF), Other, Diabetes, Hypertension (HTN)  [Meningioma with Encephalopathy & Cerebral Edema]  General Interventions   General Interventions Discussed/Reviewed General Interventions Discussed, General Interventions Reviewed, Labs, Durable Medical Equipment (DME), Doctor Visits, Communication with  [reviewed inpatient notes, lab results, and recommendations]  Labs --  [12/19/22 drug screen was negative]  PCP/Specialist Visits Compliance with  follow-up visit  [will need to follow-up with PCP once discharged home]  Communication with Social Work  [Secure Chat with Derenda Fennel LCSW [facility] Re: daughter's desire for patient to be discharged to SNF for short term rehab. PT did not recommend SNF but HHPT instead. Patient was not agreeable to SNF for rehab. Collaborated with [facility] LCSW.]  Exercise Interventions   Exercise Discussed/Reviewed Physical Activity  Physical Activity Discussed/Reviewed Physical Activity Discussed, Physical Activity Reviewed  [talked with daughter about plan to discharge home with HHPT and the importance of working on the exercises that they provide]  Education Interventions   Education Provided Provided Education  Provided Verbal Education On When to see the doctor, Exercise  Safety Interventions   Safety Discussed/Reviewed Safety Discussed, Safety Reviewed, Fall Risk, Home Safety  Home Safety Assistive Devices  [Has rolling walker. Has 2-3 steps into home with handrail. Will need assistance.]       Follow up plan: Follow up call scheduled for 12/23/22 . Daughter to talk with patient regarding SNF and will call RN Care Manager back.    Encounter Outcome:  Patient Visit Completed   Demetrios Loll, RN, BSN Care Manager [facility]  [facility]  Population Health  Direct Dial: [phone] Main #: [phone]

## 2022-12-21 NOTE — Progress Notes (Signed)
Mobility Specialist Progress Note:    12/21/22 1350  Mobility  Activity Refused mobility   Pt refused mobility d/t fatigue. All needs met.   [name] Mobility Specialist Please contact via Special educational needs teacher or  Rehab office at [phone]

## 2022-12-21 NOTE — Patient Outreach (Signed)
Care Coordination   Follow Up Visit Note   12/21/2022  Name: [name] MRN: [id number] DOB: 1955-01-25  [name] is a 67 y.o. year old male who sees [name], [name], MD for primary care. I spoke with patient's daughter, [name] by phone today.  What matters to the patients health and wellness today?  Find Help in My Community.     Goals Addressed               This Visit's Progress     Find Help in My Community. (pt-stated)   On track     Care Coordination Interventions:  Interventions Today    Flowsheet Row Most Recent Value  Chronic Disease   Chronic disease during today's visit Other, Diabetes, Congestive Heart Failure (CHF), Hypertension (HTN), Chronic Kidney Disease/End Stage Renal Disease (ESRD)  [Meningioma with Encephalopathy & Cerebral Edema, Cocaine Abuse, Tobacco Abuse, Inability to Perform Activities of Daily Living Independently, Caregiver Burnout, Stress & Fatigue, Chronic Pain]  General Interventions   General Interventions Discussed/Reviewed General Interventions Discussed, Labs, Vaccines, Doctor Visits, Health Screening, Annual Foot Exam, General Interventions Reviewed, Annual Eye Exam, Lipid Profile, Durable Medical Equipment (DME), Community Resources, Level of Care, Communication with  [Communication with Care Team Members]  Labs Hgb A1c every 3 months, Kidney Function  [Encouraged]  Vaccines COVID-19, Flu, Pneumonia, RSV, Shingles, Tetanus/Pertussis/Diphtheria  [Encouraged]  Doctor Visits Discussed/Reviewed Doctor Visits Discussed, Specialist, Doctor Visits Reviewed, Annual Wellness Visits, PCP  [Encouraged Routine Engagement]  Health Screening Bone Density, Colonoscopy, Prostate  [Encouraged]  Durable Medical Equipment (DME) Dan Humphreys, Other  [Rollator Walker]  PCP/Specialist Visits Compliance with follow-up visit  [Encouraged Routine Engagement]  Communication with PCP/Specialists, Charity fundraiser, Pharmacists, Social Work  [employer] Routine  Engagement]  Level of Care Adult Daycare, Air traffic controller, Assisted Living, Skilled Nursing Facility  [Confirmed Disinterest in Enrollment in Adult Day Care Program or Pursuing Higher Level of Care Placement Options (I.e Assisted Living or Skilled Nursing Facility).]  Applications Medicaid, Personal Care Services  [Confirmed Interest in Reapplying for Medicaid & Personal Care Services,  Applications Mailed & Offered to Assist with Completion & Submission.]  Exercise Interventions   Exercise Discussed/Reviewed Exercise Discussed, Assistive device use and maintanence, Exercise Reviewed, Physical Activity, Weight Managment  [Encouraged]  Physical Activity Discussed/Reviewed Physical Activity Discussed, Home Exercise Program (HEP), Physical Activity Reviewed, PREP, Gym, Types of exercise  [Encouraged]  Weight Management Weight loss  [Encouraged]  Education Interventions   Education Provided Provided Therapist, sports, Provided Web-based Education, Provided Education  [Encouraged Review.]  Provided Engineer, petroleum On Nutrition, Mental Health/Coping with Illness, When to see the doctor, Foot Care, Eye Care, Labs, Blood Sugar Monitoring, Applications, Exercise, Medication, [employer], Development worker, community  [Encouraged Consideration & Implementation.]  Labs Reviewed --  [Low Hemoglobin Level,  Encouraged to Follow-Up with Primary Care Provider.]  Applications Medicaid, Personal Care Services  [Confirmed Interest in Reapplying for Medicaid & Personal Care Services,  Applications Mailed & Offered to Assist with Completion & Submission.]  Mental Health Interventions   Mental Health Discussed/Reviewed Mental Health Discussed, Anxiety, Depression, Grief and Loss, Mental Health Reviewed, Substance Abuse, Coping Strategies, Suicide, Crisis, Other  [Assessed Mental Health Status & Cognitive Status.]  Nutrition Interventions   Nutrition Discussed/Reviewed Nutrition Discussed, Adding fruits and vegetables, Increasing  proteins, Decreasing fats, Nutrition Reviewed, Fluid intake, Decreasing salt, Portion sizes, Carbohydrate meal planning, Decreasing sugar intake  [Encouraged]  Pharmacy Interventions   Pharmacy Dicussed/Reviewed Pharmacy Topics Discussed, Medications and their functions, Medication Adherence, Pharmacy Topics Reviewed,  Affording Medications  [Discussed Importance of Compliance.]  Medication Adherence --  [Confirmed Compliance with Medications, Administered by Orest Dikes Hicks.]  Referral to Pharmacist --  Edilia Bo Ability to Afford Prescription Medications.]  Safety Interventions   Safety Discussed/Reviewed Safety Discussed, Safety Reviewed, Fall Risk, Home Safety  [Fall Risk, Unsteady Balance/Gait, History of Falls & Encouraged Routine Use of Assistive Devices.]  Home Safety Assistive Devices, Need for home safety assessment, Contact provider for referral to PT/OT, Refer for community resources, Refer for home visit  [Encouraged Consideration of Home Safety Evaluation,  Request Placed for Orders for Home Health Physical & Occupational Therapy Services,  Encouraged Routine Use of Rollator Walker]  Advanced Directive Interventions   Advanced Directives Discussed/Reviewed Advanced Directives Discussed, Advanced Directives Reviewed  [Encouraged Initiation of Advanced Directives, Offering to [employer] & Assist with Completion.]      Active Listening & Reflection Utilized. Verbalization of Feelings Encouraged. Emotional Support Provided. Problem Solving Interventions Activated. Task-Centered Solutions Employed.   Solution-Focused Strategies Implemented. Acceptance & Commitment Therapy Initiated. Cognitive Behavioral Therapy Performed. Client-Centered Therapy Conducted. CSW Collaboration with Daughter, [name] to Confirm Patient's Current Hospitalization at [facility] While Awaiting Physical & Occupational Therapy Consults, Recommendations & Evaluations, Insurance  Authorization & Approval for Skilled Nursing Facility Placement to Receive Short-Term Rehabilitative Services. CSW Collaboration with Daughter, [name] to Confirm Interest in Skilled Nursing Facility Placement for Patient at [facility] [phone]) or [facility] [phone]). CSW Collaboration with Daughter, [name] to Confirm Interest in Reinstating Personal Care Services, through [facility] [phone]), Upon Patient's Return Home to Live Independently.  CSW Collaboration with Daughter, [name] to Encourage Routine Engagement with Danford Bad, Licensed Clinical Social Worker with [facility] [phone]), if She Has Questions, Needs Assistance, or If Additional Social Work Needs Are Identified Between Now & Our Next Follow-Up Outreach Call, Scheduled on 01/02/2023 at 12:00 PM.      SDOH assessments and interventions completed:  Yes.  Care Coordination Interventions:  Yes, provided.   Follow up plan: Follow up call scheduled for 01/02/2023 at 12:00 pm.  Encounter Outcome:  Patient Visit Completed.   Danford Bad, BSW, MSW, Printmaker Social Work Case Set designer Health  [facility], Population Health Direct Dial: [phone]  Fax: [phone] Email: [email or web address]

## 2022-12-21 NOTE — Progress Notes (Signed)
Telemetry called stating patient had an 11 beat run of vtach. Patient states he is not having any chest pain. Vitals are 82 hr, 100 o2 on RA, bp 86/51. MD Tat aware.

## 2022-12-22 ENCOUNTER — Inpatient Hospital Stay (HOSPITAL_COMMUNITY): Payer: No Typology Code available for payment source

## 2022-12-22 ENCOUNTER — Other Ambulatory Visit (HOSPITAL_COMMUNITY): Payer: Self-pay | Admitting: *Deleted

## 2022-12-22 DIAGNOSIS — A419 Sepsis, unspecified organism: Secondary | ICD-10-CM | POA: Diagnosis not present

## 2022-12-22 DIAGNOSIS — N1832 Chronic kidney disease, stage 3b: Secondary | ICD-10-CM | POA: Diagnosis not present

## 2022-12-22 DIAGNOSIS — K610 Anal abscess: Secondary | ICD-10-CM | POA: Diagnosis not present

## 2022-12-22 DIAGNOSIS — I5021 Acute systolic (congestive) heart failure: Secondary | ICD-10-CM | POA: Diagnosis not present

## 2022-12-22 DIAGNOSIS — F17200 Nicotine dependence, unspecified, uncomplicated: Secondary | ICD-10-CM | POA: Diagnosis not present

## 2022-12-22 LAB — PREPARE RBC (CROSSMATCH)

## 2022-12-22 LAB — CULTURE, BLOOD (ROUTINE X 2): Special Requests: ADEQUATE

## 2022-12-22 LAB — ECHOCARDIOGRAM LIMITED
Calc EF: 29.2 %
Height: 69 in
S' Lateral: 4.35 cm
Single Plane A2C EF: 30.2 %
Single Plane A4C EF: 28.1 %
Weight: 3227.53 [oz_av]

## 2022-12-22 LAB — IRON AND TIBC
Iron: 42 ug/dL — ABNORMAL LOW (ref 45–182)
Saturation Ratios: 12 % — ABNORMAL LOW (ref 17.9–39.5)
TIBC: 346 ug/dL (ref 250–450)
UIBC: 304 ug/dL

## 2022-12-22 LAB — BASIC METABOLIC PANEL
Anion gap: 10 (ref 5–15)
BUN: 25 mg/dL — ABNORMAL HIGH (ref 8–23)
CO2: 21 mmol/L — ABNORMAL LOW (ref 22–32)
Calcium: 8.2 mg/dL — ABNORMAL LOW (ref 8.9–10.3)
Chloride: 103 mmol/L (ref 98–111)
Creatinine, Ser: 1.48 mg/dL — ABNORMAL HIGH (ref 0.61–1.24)
GFR, Estimated: 51 mL/min — ABNORMAL LOW (ref >60)
Glucose, Bld: 257 mg/dL — ABNORMAL HIGH (ref 70–99)
Potassium: 4.4 mmol/L (ref 3.5–5.1)
Sodium: 134 mmol/L — ABNORMAL LOW (ref 135–145)

## 2022-12-22 LAB — MRSA NEXT GEN BY PCR, NASAL: MRSA by PCR Next Gen: NOT DETECTED

## 2022-12-22 LAB — FERRITIN: Ferritin: 23 ng/mL — ABNORMAL LOW (ref 24–336)

## 2022-12-22 LAB — CBC
HCT: 27.6 % — ABNORMAL LOW (ref 39.0–52.0)
Hemoglobin: 7.7 g/dL — ABNORMAL LOW (ref 13.0–17.0)
MCH: 21.5 pg — ABNORMAL LOW (ref 26.0–34.0)
MCHC: 27.9 g/dL — ABNORMAL LOW (ref 30.0–36.0)
MCV: 77.1 fL — ABNORMAL LOW (ref 80.0–100.0)
Platelets: 446 10*3/uL — ABNORMAL HIGH (ref 150–400)
RBC: 3.58 MIL/uL — ABNORMAL LOW (ref 4.22–5.81)
RDW: 22 % — ABNORMAL HIGH (ref 11.5–15.5)
WBC: 18.7 10*3/uL — ABNORMAL HIGH (ref 4.0–10.5)
nRBC: 0.5 % — ABNORMAL HIGH (ref 0.0–0.2)

## 2022-12-22 LAB — AEROBIC CULTURE W GRAM STAIN (SUPERFICIAL SPECIMEN): Culture: NORMAL

## 2022-12-22 LAB — OCCULT BLOOD X 1 CARD TO LAB, STOOL: Fecal Occult Bld: POSITIVE — AB

## 2022-12-22 LAB — MAGNESIUM: Magnesium: 2.3 mg/dL (ref 1.7–2.4)

## 2022-12-22 LAB — LACTIC ACID, PLASMA: Lactic Acid, Venous: 1.9 mmol/L (ref 0.5–1.9)

## 2022-12-22 MED ORDER — ENSURE ENLIVE PO LIQD: 237.0000 mL | Freq: Two times a day (BID) | ORAL | Status: DC

## 2022-12-22 MED ORDER — PERFLUTREN LIPID MICROSPHERE
1.0000 mL | INTRAVENOUS | Status: AC | PRN
  Administered 2022-12-22: 4 mL via INTRAVENOUS

## 2022-12-22 MED ORDER — CHLORHEXIDINE GLUCONATE CLOTH 2 % EX PADS
6.0000 | MEDICATED_PAD | Freq: Every day | CUTANEOUS | Status: DC
  Administered 2022-12-23 – 2022-12-24 (×2): 6 via TOPICAL

## 2022-12-22 MED ORDER — SODIUM CHLORIDE 0.9% IV SOLUTION: Freq: Once | INTRAVENOUS | Status: AC

## 2022-12-22 NOTE — Progress Notes (Signed)
Attempted echocardiogram but patient was having a PIC line inserted. Will check back later.                Celesta Gentile, RCS

## 2022-12-22 NOTE — Progress Notes (Addendum)
Patient alert and oriented x4. Dr Lovell Sheehan and writer in patient room, Dr Lovell Sheehan discussed/education placing central line but patient is refusing at this time. Patient refusing any sticks with needles at this time stating, "I need a break, I have been poked and prodded with needles all day and I'm tired of it today." Patient agreeable for Dr Lovell Sheehan to come back tomorrow morning to place central line. Dr Lovell Sheehan stated he will update Dr Tat. Writer updated Dr Tat made aware that blood infusion and flagyl given and on patient blood pressures since coming to unit, have been good with SBP>100 and Maps >65. Patient currently sitting up in bed with no complaints, getting echo done. Patient daughter called and updated.

## 2022-12-22 NOTE — Progress Notes (Signed)
The patient is refusing to wear the medical monitoring equipment at this time.  He has removed his ECG leads, BP cuff, and SpO2 monitor.  He was educated on the importance of this equipment and it's roll in his care.  He continues to refuse.  His daughter was contacted to speak with the patient about compliance with monitoring equipment.  She is speaking with the patient at this time and he is now amenable to wearing the equipment.

## 2022-12-22 NOTE — Progress Notes (Signed)
Patient is currently receiving PICC line placement. Will start blood and take patient to stepdown one PICC line is placed. MD Tat aware.

## 2022-12-22 NOTE — Plan of Care (Signed)

## 2022-12-22 NOTE — Progress Notes (Signed)
*  PRELIMINARY RESULTS* Echocardiogram Limited 2-D Echocardiogram  has been performed with Definity.  Stacey Drain 12/22/2022, 4:43 PM

## 2022-12-22 NOTE — Progress Notes (Signed)
Pharmacy Antibiotic Note  [name] is a 67 y.o. male admitted on 12/18/2022 with sepsis.  Pharmacy has been consulted for vancomycin dosing.  Patient currently afebrile, wbc elevated at 18 but trending down. Scr also trending down to 1.4.   Bedside debridement done in ED on admit. Cultures from that returned normal flora. Creatinine has improved, but antibiotic dosing appropriate. Will consider vancomycin level tomorrow.   CRO 12/8> Vanc 12/8> Flagyl 12/10>  12/9 bldx2: staph epi in 1/2 (mec A+) and diptheroids in other set 12/9 abscess: GPC>>normal flora Mrsa pcr neg  Plan:  Vancomycin 1000 mg IV Q 24 hrs. Goal AUC 400-550. Expected AUC: 464 SCr used: 1.4 Continue ceftriaxone  Height: 5\' 9"  (175.3 cm) Weight: 93.2 kg (205 lb 7.5 oz) IBW/kg (Calculated) : 70.7  Temp (24hrs), Avg:97.8 F (36.6 C), Min:97.7 F (36.5 C), Max:98 F (36.7 C)  Recent Labs  Lab 12/18/22 2344 12/19/22 0141 12/19/22 0437 12/19/22 0441 12/19/22 0746 12/20/22 0452 12/21/22 0511 12/21/22 1510 12/22/22 0442  WBC 24.7*  --  24.2*  --   --  18.9* 18.6*  --  18.7*  CREATININE 2.05*  --  1.96*  --   --  2.08* 2.04*  --  1.48*  LATICACIDVEN 1.5   < >  --  1.9 2.6* 0.9  --  2.2* 1.9   < > = values in this interval not displayed.    Estimated Creatinine Clearance: 53.9 mL/min (A) (by C-G formula based on SCr of 1.48 mg/dL (H)).    Allergies  Allergen Reactions   Clopidogrel Other (See Comments)    Drowsy, Skin irritation    Thank you for allowing pharmacy to be a part of this patient's care.  Sheppard Coil PharmD., BCPS Clinical Pharmacist 12/22/2022 9:00 AM

## 2022-12-22 NOTE — Progress Notes (Signed)
PROGRESS NOTE  [name]:[id number] DOB: [birth year] DOA: 12/18/2022 PCP: Benetta Spar, MD  Brief History:  67 y.o. male with a history of CAD, tobacco use, HTN, HLD, T2DM, GERD, history of cocaine use, OSA, and combined CHF (LVEF 20-25%, G2DD), DM polyneuropathy, iron deficiency anemia who presented.  after a fall in the shower. Endorses having pain all over. Also complains of perianal pain.   In the ED, tachycardic with soft BPs, lab studies notable for leukocytosis 24.7K.  Perianal abscesses noted on exam and confirmed on noncontrast CT abdomen and pelvis.  Code sepsis was called in the ED.  The patient was started on empiric IV antibiotics, Rocephin and IV vancomycin.  Admitted by [facility], hospitalist service.   ED Course: Temperature 99.  BP 96/59, pulse 103, respiratory 17, saturation 99% on room air.  Labs today notable for WBC 24.7, hemoglobin 7.9, platelet count 610.  Creatinine 2.05 from baseline 1.76.  Notably, the patient was recently mated to the hospital from 11/02/2022 to 11/15/2022 for respiratory failure secondary to acute on chronic HFrEF.  During that hospitalization, the patient was also seen by GI for acute on chronic anemia.  He underwent EGD and colonoscopy on 11/11/2022.  EGD showed erythematous mucosa in the antrum and duodenitis.  Colonoscopy showed 3 polyps in the transverse colon, 1 in the ascending colon.  His iron deficiency was felt to be due to large polyps of 1 with visible oozing.  He was given Venofer and started on ferrous sulfate at time of discharge.  General surgery was consulted.  Bedside debridement was performed on the right perianal abscess.  Cultures were sent.  The patient was started vancomycin, ceftriaxone, and metronidazole.   Assessment/Plan: Severe Sepsis -Presented with tachycardia and leukocytosis and elevated lactic acid -Secondary to perirectal abscess -general surgery following -s/p bedside I&D on right  abscess -12/19/22 CT AP--bilateral buttock abscess R>L -BPs remain soft -increase midodrine to 10 mg TID -given one liter LR due to soft BP and elevated lactate on 12/12 -repeat limited echo   Perirectal abscess -general surgery following -s/p bedside I&D on right abscess -12/19/22 CT AP--bilateral buttock abscess R>L -12/19/22 culture--flora -continue vanc/ceftriaxone/metronidazole   Bacteremia -represents contaminant -diphtheroids in 1/2 sets -staph epi in 1/2 sets   Chronic HFrEF -clinically euvolemic -unable to restart coreg due to soft BPs -10/21/22 Echo--EF 20-25%, G2DD, mild MR -transitioned to po torsemide  20 mg daily at time of last d/c -repeat limited Echo    CKD 3b -baseline creatinine 1.6-1.9 -monitor serial BMP   Meningioma -12/19/22 CT brain--stable edema right temporal lobe possibly related to known right middle cranial fossa meningioma   Iron Deficiency Anemia -baseline Hgb ~8 -11/08/22 iron saturation 8%, ferritin 13  -11/08/22 folate 7.4--continue folate supplementation -11/1 EGD--ertythematous mucosa in antrum, +duodenitis -11/1 colonoscopy--3 polyps in transverse colon, one in ascending colon;  iron deficiency due to large polyps with one with visible oozing -transfused one unite PRBC 12/10 -repeat iron studies--iron saturation 12%, ferritin 23 -continue pantoprazole -no signs of active bleeding -transfuse addition unit PRBC 12/12 due to patient's cardiomyopathy   Polysubstance abuse -12/9 UDS negative -Patient continues to smoke -Tobacco cessation discussed   Diabetes mellitus type 2 with hyperglycemia --10/21/22 HbA1c was 6.3%  --started novolog sliding scale   CAD -No chest pain. Later did have atypical chest pain likely musculoskeletal with persistently negative troponin and no ischemic ECG changes.  - Continue ASA, statin -BB on hold  due to soft BPs   OSA:  - CPAP at bedtime   Peripheral neuropathy:  - Continue gabapentin   PAD:  Chronic left common iliac occlusion on CTA with distal reconstution.  - Needs vascular surgery evaluation as outpatient.             Family Communication:  daughter updated 12/12   Consultants:  general surgery   Code Status:  FULL    DVT Prophylaxis:  [place] Heparin      Procedures: As Listed in Progress Note Above   Antibiotics: Vanc 12/9>> Ceftriaxone 12/9>>12/12 Cefepime 12/12>> Flagyl 12/9>>           Subjective: Patient denies fevers, chills, headache, chest pain, dyspnea, nausea, vomiting, diarrhea, abdominal pain, dysuria, hematuria, hematochezia, and melena.   Objective: Vitals:   12/21/22 1507 12/21/22 1900 12/21/22 2043 12/22/22 0515  BP: (!) 87/49 (!) 100/57 102/61 (!) 90/58  Pulse: 72 77 80 77  Resp: 18  16 18   Temp: 97.8 F (36.6 C)  97.8 F (36.6 C) 98 F (36.7 C)  TempSrc: Oral  Oral Oral  SpO2: 100%  100% 98%  Weight:    93.2 kg  Height:        Intake/Output Summary (Last 24 hours) at 12/22/2022 0916 Last data filed at 12/22/2022 0501 Gross per 24 hour  Intake 1520 ml  Output --  Net 1520 ml   Weight change: 3 kg Exam:  General:  Pt is alert, follows commands appropriately, not in acute distress HEENT: No icterus, No thrush, No neck mass, [place]/AT Cardiovascular: RRR, S1/S2, no rubs, no gallops Respiratory: bibasilar crackles.  No wheeze Abdomen: Soft/+BS, non tender, non distended, no guarding Extremities: No edema, No lymphangitis, No petechiae, No rashes, no synovitis   Data Reviewed: I have personally reviewed following labs and imaging studies Basic Metabolic Panel: Recent Labs  Lab 12/18/22 2344 12/19/22 0437 12/20/22 0452 12/21/22 0511 12/22/22 0442  NA 136 136 133* 133* 134*  K 4.8 4.8 4.6 4.3 4.4  CL 100 101 103 101 103  CO2 26 24 23  20* 21*  GLUCOSE 125* 151* 126* 202* 257*  BUN 29* 28* 30* 32* 25*  CREATININE 2.05* 1.96* 2.08* 2.04* 1.48*  CALCIUM 8.9 8.8* 8.1* 8.0* 8.2*  MG  --  2.6* 2.4  --  2.3  PHOS   --  3.8 3.7  --   --    Liver Function Tests: Recent Labs  Lab 12/18/22 2344  AST 14*  ALT 11  ALKPHOS 128*  BILITOT 0.7  PROT 7.9  ALBUMIN 2.9*   No results for input(s): "LIPASE", "AMYLASE" in the last 168 hours. No results for input(s): "AMMONIA" in the last 168 hours. Coagulation Profile: Recent Labs  Lab 12/18/22 2344  INR 1.2   CBC: Recent Labs  Lab 12/18/22 2344 12/19/22 0437 12/20/22 0452 12/21/22 0511 12/22/22 0442  WBC 24.7* 24.2* 18.9* 18.6* 18.7*  NEUTROABS 21.0*  --   --   --   --   HGB 7.9* 7.8* 6.8* 7.5* 7.7*  HCT 29.6* 29.6* 25.7* 25.8* 27.6*  MCV 76.1* 76.9* 75.4* 75.4* 77.1*  PLT 610* 604* 501* 434* 446*   Cardiac Enzymes: Recent Labs  Lab 12/18/22 2344  CKTOTAL 88   BNP: Invalid input(s): "POCBNP" CBG: Recent Labs  Lab 12/18/22 2327  GLUCAP 122*   HbA1C: No results for input(s): "HGBA1C" in the last 72 hours. Urine analysis:    Component Value Date/Time   COLORURINE YELLOW 12/19/2022 0738   APPEARANCEUR  CLEAR 12/19/2022 0738   APPEARANCEUR Clear 08/02/2021 1051   LABSPEC 1.014 12/19/2022 0738   PHURINE 6.0 12/19/2022 0738   GLUCOSEU >=500 (A) 12/19/2022 0738   HGBUR NEGATIVE 12/19/2022 0738   BILIRUBINUR NEGATIVE 12/19/2022 0738   BILIRUBINUR Negative 08/02/2021 1051   KETONESUR NEGATIVE 12/19/2022 0738   PROTEINUR 30 (A) 12/19/2022 0738   UROBILINOGEN 0.2 09/27/2014 2237   NITRITE NEGATIVE 12/19/2022 0738   LEUKOCYTESUR NEGATIVE 12/19/2022 0738   Sepsis Labs: @LABRCNTIP (procalcitonin:4,lacticidven:4) ) Recent Results (from the past 240 hours)  Resp panel by RT-PCR (RSV, Flu A&B, Covid) Anterior Nasal Swab     Status: None   Collection Time: 12/18/22 11:44 PM   Specimen: Anterior Nasal Swab  Result Value Ref Range Status   SARS Coronavirus 2 by RT PCR NEGATIVE NEGATIVE Final    Comment: (NOTE) SARS-CoV-2 target nucleic acids are NOT DETECTED.  The SARS-CoV-2 RNA is generally detectable in upper respiratory specimens  during the acute phase of infection. The lowest concentration of SARS-CoV-2 viral copies this assay can detect is 138 copies/mL. A negative result does not preclude SARS-Cov-2 infection and should not be used as the sole basis for treatment or other patient management decisions. A negative result may occur with  improper specimen collection/handling, submission of specimen other than nasopharyngeal swab, presence of viral mutation(s) within the areas targeted by this assay, and inadequate number of viral copies(<138 copies/mL). A negative result must be combined with clinical observations, patient history, and epidemiological information. The expected result is Negative.  Fact Sheet for Patients:  BloggerCourse.com  Fact Sheet for Healthcare Providers:  SeriousBroker.it  This test is no t yet approved or cleared by the [place] FDA and  has been authorized for detection and/or diagnosis of SARS-CoV-2 by FDA under an Emergency Use Authorization (EUA). This EUA will remain  in effect (meaning this test can be used) for the duration of the COVID-19 declaration under Section 564(b)(1) of the Act, 21 U.S.C.section 360bbb-3(b)(1), unless the authorization is terminated  or revoked sooner.       Influenza A by PCR NEGATIVE NEGATIVE Final   Influenza B by PCR NEGATIVE NEGATIVE Final    Comment: (NOTE) The Xpert Xpress SARS-CoV-2/FLU/RSV plus assay is intended as an aid in the diagnosis of influenza from Nasopharyngeal swab specimens and should not be used as a sole basis for treatment. Nasal washings and aspirates are unacceptable for Xpert Xpress SARS-CoV-2/FLU/RSV testing.  Fact Sheet for Patients: BloggerCourse.com  Fact Sheet for Healthcare Providers: SeriousBroker.it  This test is not yet approved or cleared by the [place] FDA and has been authorized for detection  and/or diagnosis of SARS-CoV-2 by FDA under an Emergency Use Authorization (EUA). This EUA will remain in effect (meaning this test can be used) for the duration of the COVID-19 declaration under Section 564(b)(1) of the Act, 21 U.S.C. section 360bbb-3(b)(1), unless the authorization is terminated or revoked.     Resp Syncytial Virus by PCR NEGATIVE NEGATIVE Final    Comment: (NOTE) Fact Sheet for Patients: BloggerCourse.com  Fact Sheet for Healthcare Providers: SeriousBroker.it  This test is not yet approved or cleared by the [place] FDA and has been authorized for detection and/or diagnosis of SARS-CoV-2 by FDA under an Emergency Use Authorization (EUA). This EUA will remain in effect (meaning this test can be used) for the duration of the COVID-19 declaration under Section 564(b)(1) of the Act, 21 U.S.C. section 360bbb-3(b)(1), unless the authorization is terminated or revoked.  Performed at [facility], [address] [place], [place] [address]   Blood Culture (routine x 2)     Status: Abnormal (Preliminary result)   Collection Time: 12/18/22 11:44 PM   Specimen: Right Antecubital; Blood  Result Value Ref Range Status   Specimen Description   Final    RIGHT ANTECUBITAL Performed at [facility], [address] [place], [place] [address]    Special Requests   Final    BOTTLES DRAWN AEROBIC AND ANAEROBIC Blood Culture adequate volume Performed at [facility], [address] [place], [place] [address]    Culture  Setup Time   Final    GRAM POSITIVE RODS AEROBIC BOTTLE ONLY CRITICAL RESULT CALLED TO, READ BACK BY AND VERIFIED WITH: ANGEL Ray at 1705 12/20/2022 by T KENNEDY PREVIOUSLY REPORTED AS: GRAM POSITIVE COCCI CORRECTED RESULTS CALLED TO: RN Ethelene Hal [phone] @ 2247 FH    Culture (A)  Final    DIPHTHEROIDS(CORYNEBACTERIUM SPECIES) Standardized susceptibility testing for this organism is not  available. Performed at [facility], [address] [place], [place] [address]    Report Status PENDING  Incomplete  Blood Culture (routine x 2)     Status: Abnormal   Collection Time: 12/19/22 12:15 AM   Specimen: BLOOD LEFT HAND  Result Value Ref Range Status   Specimen Description   Final    BLOOD LEFT HAND Performed at [facility], [address] [place], [place] [address]    Special Requests   Final    BOTTLES DRAWN AEROBIC AND ANAEROBIC Blood Culture adequate volume Performed at [facility], [address], [place], [place] [address]    Culture  Setup Time   Final    GRAM POSITIVE COCCI AEROBIC BOTTLE Gram Stain Report Called to,Read Back By and Verified With: VILLALOBOS,S ON 12/19/22 AT 2300 BY LOY,C Organism ID to follow CRITICAL RESULT CALLED TO, READ BACK BY AND VERIFIED WITH: S VILLALOBOS,RN@0340  12/20/22 MK    Culture (A)  Final    STAPHYLOCOCCUS EPIDERMIDIS THE SIGNIFICANCE OF ISOLATING THIS ORGANISM FROM A SINGLE SET OF BLOOD CULTURES WHEN MULTIPLE SETS ARE DRAWN IS UNCERTAIN. PLEASE NOTIFY THE MICROBIOLOGY DEPARTMENT WITHIN ONE WEEK IF SPECIATION AND SENSITIVITIES ARE REQUIRED. Performed at [facility], [address] [place], [place] [address]    Report Status 12/22/2022 FINAL  Final  Blood Culture ID Panel (Reflexed)     Status: Abnormal   Collection Time: 12/19/22 12:15 AM  Result Value Ref Range Status   Enterococcus faecalis NOT DETECTED NOT DETECTED Final   Enterococcus Faecium NOT DETECTED NOT DETECTED Final   Listeria monocytogenes NOT DETECTED NOT DETECTED Final   Staphylococcus species DETECTED (A) NOT DETECTED Final    Comment: CRITICAL RESULT CALLED TO, READ BACK BY AND VERIFIED WITH: S VILLALOBOS,RN@0340  12/20/22 MK    Staphylococcus aureus (BCID) NOT DETECTED NOT DETECTED Final   Staphylococcus epidermidis DETECTED (A) NOT DETECTED Final    Comment: Methicillin (oxacillin) resistant coagulase negative staphylococcus. Possible blood  culture contaminant (unless isolated from more than one blood culture draw or clinical case suggests pathogenicity). No antibiotic treatment is indicated for blood  culture contaminants. CRITICAL RESULT CALLED TO, READ BACK BY AND VERIFIED WITH: S VILLALOBOS,RN@0340  12/20/22 MK    Staphylococcus lugdunensis NOT DETECTED NOT DETECTED Final   Streptococcus species NOT DETECTED NOT DETECTED Final   Streptococcus agalactiae NOT DETECTED NOT DETECTED Final   Streptococcus pneumoniae NOT DETECTED NOT DETECTED Final   Streptococcus pyogenes NOT DETECTED NOT DETECTED Final   A.calcoaceticus-baumannii  NOT DETECTED NOT DETECTED Final   Bacteroides fragilis NOT DETECTED NOT DETECTED Final   Enterobacterales NOT DETECTED NOT DETECTED Final   Enterobacter cloacae complex NOT DETECTED NOT DETECTED Final   Escherichia coli NOT DETECTED NOT DETECTED Final   Klebsiella aerogenes NOT DETECTED NOT DETECTED Final   Klebsiella oxytoca NOT DETECTED NOT DETECTED Final   Klebsiella pneumoniae NOT DETECTED NOT DETECTED Final   Proteus species NOT DETECTED NOT DETECTED Final   Salmonella species NOT DETECTED NOT DETECTED Final   Serratia marcescens NOT DETECTED NOT DETECTED Final   Haemophilus influenzae NOT DETECTED NOT DETECTED Final   Neisseria meningitidis NOT DETECTED NOT DETECTED Final   Pseudomonas aeruginosa NOT DETECTED NOT DETECTED Final   Stenotrophomonas maltophilia NOT DETECTED NOT DETECTED Final   Candida albicans NOT DETECTED NOT DETECTED Final   Candida auris NOT DETECTED NOT DETECTED Final   Candida glabrata NOT DETECTED NOT DETECTED Final   Candida krusei NOT DETECTED NOT DETECTED Final   Candida parapsilosis NOT DETECTED NOT DETECTED Final   Candida tropicalis NOT DETECTED NOT DETECTED Final   Cryptococcus neoformans/gattii NOT DETECTED NOT DETECTED Final   Methicillin resistance mecA/C DETECTED (A) NOT DETECTED Final    Comment: CRITICAL RESULT CALLED TO, READ BACK BY AND VERIFIED WITH: S  VILLALOBOS,RN@0340  12/20/22 MK Performed at [facility], [address], [place], [place] [address]   MRSA Next Gen by PCR, Nasal     Status: None   Collection Time: 12/19/22  5:07 AM   Specimen: Nasal Mucosa; Nasal Swab  Result Value Ref Range Status   MRSA by PCR Next Gen NOT DETECTED NOT DETECTED Final    Comment: (NOTE) The GeneXpert MRSA Assay (FDA approved for NASAL specimens only), is one component of a comprehensive MRSA colonization surveillance program. It is not intended to diagnose MRSA infection nor to guide or monitor treatment for MRSA infections. Test performance is not FDA approved in patients less than 60 years old. Performed at [facility], [address] [place], [place] [address]   Aerobic Culture w Gram Stain (superficial specimen)     Status: None (Preliminary result)   Collection Time: 12/19/22  2:40 PM   Specimen: Abscess  Result Value Ref Range Status   Specimen Description   Final    ABSCESS Performed at [facility], [address], [place], [place] [address]    Special Requests   Final    BUTTOCKS Performed at [facility], [address], [place], [place] [address]    Gram Stain   Final    FEW WBC PRESENT,BOTH PMN AND MONONUCLEAR RARE GRAM POSITIVE COCCI IN PAIRS RARE GRAM POSITIVE COCCI IN CHAINS    Culture   Final    RARE NORMAL SKIN FLORA Performed at [facility], [address] [place], [place] [address]    Report Status PENDING  Incomplete     Scheduled Meds:  atorvastatin  40 mg Oral Daily   ferrous sulfate  325 mg Oral Q breakfast   folic acid  1 mg Oral Daily   gabapentin  100 mg Oral TID   heparin  5,000 Units Subcutaneous Q8H   linaclotide  145 mcg Oral QAC breakfast   midodrine  10 mg Oral TID WC   multivitamin with minerals  1 tablet Oral Daily   pantoprazole  40 mg Oral Daily   Continuous Infusions:  cefTRIAXone (ROCEPHIN)  IV Stopped (12/21/22 2213)   metronidazole Stopped (12/22/22 0058)   vancomycin  1,000  mg (12/22/22 0823)    Procedures/Studies: [place] EKG SITE RITE Result Date: 12/21/2022 If Site Rite image not attached, placement could not be confirmed due to current cardiac rhythm.  CT CHEST ABDOMEN PELVIS WO CONTRAST Result Date: 12/19/2022 CLINICAL DATA:  Fall, shortness of breath, buttock abscesses EXAM: CT CHEST, ABDOMEN AND PELVIS WITHOUT CONTRAST TECHNIQUE: Multidetector CT imaging of the chest, abdomen and pelvis was performed following the standard protocol without IV contrast. RADIATION DOSE REDUCTION: This exam was performed according to the departmental dose-optimization program which includes automated exposure control, adjustment of the mA and/or kV according to patient size and/or use of iterative reconstruction technique. COMPARISON:  CTA abdomen/pelvis dated 11/02/2022 FINDINGS: CT CHEST FINDINGS Cardiovascular: Cardiomegaly. No pericardial effusion. Hypodense blood pool relative to myocardium, suggesting anemia. No evidence of thoracic aortic aneurysm. Atherosclerotic calcifications of the arch. Severe three-vessel coronary atherosclerosis. Mediastinum/Nodes: Small mediastinal nodes, measuring up to 12 mm short axis in the right paratracheal region (series 2/image 14), previously 17 mm, likely reactive. Visualized thyroid is unremarkable. Lungs/Pleura: Mosaic attenuation with mild scarring/atelectasis in the lungs bilaterally. No focal consolidation. No suspicious pulmonary nodules. No pleural effusion or pneumothorax. Musculoskeletal: Degenerative changes of the thoracic spine. Sternum, clavicles, and scapulae are intact. Bilateral ribs are intact. CT ABDOMEN PELVIS FINDINGS Hepatobiliary: Unenhanced liver is unremarkable. Layering tiny gallstones (series 2/image 58), without associated inflammatory changes. No intrahepatic or extrahepatic ductal dilatation. Pancreas: Within normal limits. Spleen: Within normal limits. Adrenals/Urinary Tract: Adrenal glands are within normal limits.  Kidneys are within normal limits.  No hydronephrosis. Bladder is within normal limits. Stomach/Bowel: Stomach is within normal limits. No evidence of bowel obstruction. Normal appendix/appendiceal stump (series 2/image 84). No colonic wall thickening or inflammatory changes. Vascular/Lymphatic: No evidence of abdominal aortic aneurysm. Atherosclerotic calcifications of the abdominal aorta and branch vessels. Left external iliac stent. No suspicious abdominopelvic lymphadenopathy. Reproductive: Prostate is unremarkable. Other: No abdominopelvic ascites. Musculoskeletal: 3.1 x 2.8 cm fluid collection with trace nondependent gas along the left medial gluteal cleft (series 2/image 122). 6.5 x 3.4 cm fluid collection inferiorly along the right medial gluteal cleft (series 2/image 135). These likely correspond to the patient's known buttock abscesses. Mild degenerative changes of the lumbar spine.  No fracture is seen. IMPRESSION: No traumatic injury to the chest, abdomen, or pelvis. Bilateral buttock abscesses, right greater than left. Additional ancillary findings as above. Electronically Signed   By: Charline Bills M.D.   On: 12/19/2022 00:19   DG Chest Port 1 View Result Date: 12/19/2022 CLINICAL DATA:  Questionable sepsis - evaluate for abnormality EXAM: PORTABLE CHEST 1 VIEW COMPARISON:  11/28/2022 FINDINGS: Cardiomegaly. Vascular congestion. Low lung volumes with bibasilar atelectasis. No overt edema or effusions. No acute bony abnormality. IMPRESSION: Cardiomegaly, vascular congestion. Low lung volumes with bibasilar atelectasis. Electronically Signed   By: Charlett Nose M.D.   On: 12/19/2022 00:19   CT Cervical Spine Wo Contrast Result Date: 12/19/2022 CLINICAL DATA:  Fall.  Neck trauma (Age >= 65y) EXAM: CT CERVICAL SPINE WITHOUT CONTRAST TECHNIQUE: Multidetector CT imaging of the cervical spine was performed without intravenous contrast. Multiplanar CT image reconstructions were also generated.  RADIATION DOSE REDUCTION: This exam was performed according to the departmental dose-optimization program which includes automated exposure control, adjustment of the mA and/or kV according to patient size and/or use of iterative reconstruction technique. COMPARISON:  11/07/2021 FINDINGS: Alignment: Normal Skull base and vertebrae: No acute fracture. No primary bone lesion or focal pathologic process. Soft tissues and spinal canal: No prevertebral fluid or  swelling. No visible canal hematoma. Disc levels: Mild diffuse degenerative disc disease and facet disease. Upper chest: No acute findings Other: None IMPRESSION: No acute bony abnormality. Electronically Signed   By: Charlett Nose M.D.   On: 12/19/2022 00:14   CT HEAD WO CONTRAST ( ) Result Date: 12/19/2022 CLINICAL DATA:  Fall.  Head trauma, minor (Age >= 65y) EXAM: CT HEAD WITHOUT CONTRAST TECHNIQUE: Contiguous axial images were obtained from the base of the skull through the vertex without intravenous contrast. RADIATION DOSE REDUCTION: This exam was performed according to the departmental dose-optimization program which includes automated exposure control, adjustment of the mA and/or kV according to patient size and/or use of iterative reconstruction technique. COMPARISON:  11/28/2022, MRI 11/06/2022 FINDINGS: Brain: There is atrophy and chronic small vessel disease changes. Low-density/edema again noted in the right temporal lobe, unchanged. 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 No acute infarct, hemorrhage or hydrocephalus. Vascular: No hyperdense vessel or unexpected calcification. Skull: No acute calvarial abnormality. Sinuses/Orbits: No acute findings Other: None IMPRESSION: Stable edema within the right temporal lobe, possibly related to known right middle cranial fossa meningioma, not seen on this study. Old infarcts as above. Atrophy, chronic microvascular disease. No acute intracranial abnormality. Electronically  Signed   By: Charlett Nose M.D.   On: 12/19/2022 00:13   CT Head Wo Contrast Result Date: 11/28/2022 CLINICAL DATA:  Mental status change, alcohol/drug use EXAM: CT HEAD WITHOUT CONTRAST TECHNIQUE: Contiguous axial images were obtained from the base of the skull through the vertex without intravenous contrast. RADIATION DOSE REDUCTION: This exam was performed according to the departmental dose-optimization program which includes automated exposure control, adjustment of the mA and/or kV according to patient size and/or use of iterative reconstruction technique. COMPARISON:  Head CT 06/30/2022, brain MR 11/06/22 FINDINGS: Brain: Persistent nonspecific edema in the right temporal lobe. Known meningioma in the right middle cranial fossa is not visualized on this exam due to CT technique. No hemorrhage. No hydrocephalus. No extra-axial fluid collection. No CT evidence of cortical infarct. Unchanged chronic infarcts in the right occipital lobe and left thalamus Vascular: No hyperdense vessel or unexpected calcification. Skull: Normal. Negative for fracture or focal lesion. Sinuses/Orbits: No middle ear or mastoid effusion. Paranasal sinuses are clear. Orbits are unremarkable. Other: None IMPRESSION: 1. Persistent nonspecific edema in the right temporal lobe. No hemorrhage. 2. Known meningioma in the right middle cranial fossa is not visualized on this exam due to CT technique. 3. Unchanged chronic infarcts in the right occipital lobe and left thalamus. Electronically Signed   By: Lorenza Cambridge M.D.   On: 11/28/2022 19:53   DG Chest 2 View Result Date: 11/28/2022 CLINICAL DATA:  Shortness of breath. EXAM: CHEST - 2 VIEW COMPARISON:  Chest radiograph dated October 18, 2022. FINDINGS: The heart size is enlarged. The mediastinal contours are within normal limits. Bilateral interstitial prominence, most pronounced at the lung bases. No focal consolidation. No pleural effusion or pneumothorax. No acute osseous  abnormality. IMPRESSION: Cardiomegaly with findings suggestive of pulmonary vascular congestion. Electronically Signed   By: Hart Robinsons M.D.   On: 11/28/2022 19:22    Catarina Hartshorn, DO  [facility]  If 7PM-7AM, please contact night-coverage [email or web address] Password [email or web address] 12/22/2022, 9:16 AM   LOS: 3 days

## 2022-12-22 NOTE — Progress Notes (Signed)
PT Cancellation Note  Patient Details Name: SHERLOCK CALLERY MRN: [id number] DOB: Jan 17, 1955   Cancelled Treatment:    Pt will need a new order when medically stable.     Virgina Organ, PT CLT [phone]  12/22/2022, 4:21 PM

## 2022-12-22 NOTE — Progress Notes (Signed)
At bedside for PICC placement. Identified the brachial vessel (5% occupancy) with good compressibility. Cannulated the vessels and guidewire without issues, however, unable to thread the PICC into the SVC. With multiple attempts, including repositioning, all interventions unsuccessful. PICC terminating in the R internal jugular. Aborted procedure and removed PICC. Site cleaned with CHG and covered with a Tegaderm. Victorino Dike, RN aware and to alert MD. Pt refuses to have an USGPIV at this time. Currently has 1 PIV in the R hand. Recommend IR for further CVC placements.

## 2022-12-23 ENCOUNTER — Inpatient Hospital Stay (HOSPITAL_COMMUNITY): Payer: No Typology Code available for payment source

## 2022-12-23 ENCOUNTER — Ambulatory Visit: Payer: Self-pay | Admitting: *Deleted

## 2022-12-23 DIAGNOSIS — K610 Anal abscess: Secondary | ICD-10-CM | POA: Diagnosis not present

## 2022-12-23 DIAGNOSIS — F17200 Nicotine dependence, unspecified, uncomplicated: Secondary | ICD-10-CM | POA: Diagnosis not present

## 2022-12-23 DIAGNOSIS — A419 Sepsis, unspecified organism: Secondary | ICD-10-CM | POA: Diagnosis not present

## 2022-12-23 DIAGNOSIS — E1149 Type 2 diabetes mellitus with other diabetic neurological complication: Secondary | ICD-10-CM | POA: Diagnosis not present

## 2022-12-23 LAB — CBC
HCT: 30.1 % — ABNORMAL LOW (ref 39.0–52.0)
Hemoglobin: 8.6 g/dL — ABNORMAL LOW (ref 13.0–17.0)
MCH: 22.4 pg — ABNORMAL LOW (ref 26.0–34.0)
MCHC: 28.6 g/dL — ABNORMAL LOW (ref 30.0–36.0)
MCV: 78.4 fL — ABNORMAL LOW (ref 80.0–100.0)
Platelets: 381 10*3/uL (ref 150–400)
RBC: 3.84 MIL/uL — ABNORMAL LOW (ref 4.22–5.81)
RDW: 22.3 % — ABNORMAL HIGH (ref 11.5–15.5)
WBC: 19.1 10*3/uL — ABNORMAL HIGH (ref 4.0–10.5)
nRBC: 1.1 % — ABNORMAL HIGH (ref 0.0–0.2)

## 2022-12-23 LAB — TYPE AND SCREEN
ABO/RH(D): A POS
Antibody Screen: NEGATIVE
Unit division: 0
Unit division: 0

## 2022-12-23 LAB — GLUCOSE, CAPILLARY: Glucose-Capillary: 271 mg/dL — ABNORMAL HIGH (ref 70–99)

## 2022-12-23 LAB — BASIC METABOLIC PANEL
Anion gap: 9 (ref 5–15)
BUN: 23 mg/dL (ref 8–23)
CO2: 20 mmol/L — ABNORMAL LOW (ref 22–32)
Calcium: 8.1 mg/dL — ABNORMAL LOW (ref 8.9–10.3)
Chloride: 103 mmol/L (ref 98–111)
Creatinine, Ser: 1.45 mg/dL — ABNORMAL HIGH (ref 0.61–1.24)
GFR, Estimated: 52 mL/min — ABNORMAL LOW (ref >60)
Glucose, Bld: 195 mg/dL — ABNORMAL HIGH (ref 70–99)
Potassium: 4.8 mmol/L (ref 3.5–5.1)
Sodium: 132 mmol/L — ABNORMAL LOW (ref 135–145)

## 2022-12-23 LAB — CULTURE, BLOOD (ROUTINE X 2): Special Requests: ADEQUATE

## 2022-12-23 LAB — BPAM RBC
Blood Product Expiration Date: 202412272359
Blood Product Expiration Date: 202412302359
ISSUE DATE / TIME: 202412101405
ISSUE DATE / TIME: 202412121134
Unit Type and Rh: 6200
Unit Type and Rh: 6200

## 2022-12-23 LAB — VANCOMYCIN, TROUGH: Vancomycin Tr: 12 ug/mL — ABNORMAL LOW (ref 15–20)

## 2022-12-23 MED ORDER — VANCOMYCIN HCL 1250 MG/250ML IV SOLN
1250.0000 mg | INTRAVENOUS | Status: DC
  Filled 2022-12-23: qty 250

## 2022-12-23 MED ORDER — METRONIDAZOLE 500 MG PO TABS
500.0000 mg | ORAL_TABLET | Freq: Once | ORAL | Status: AC
  Administered 2022-12-24: 500 mg via ORAL
  Filled 2022-12-23: qty 1

## 2022-12-23 NOTE — Consult Note (Signed)
[facility] Liaison Consult Note   12/23/2022  [name] 26-Sep-1955 [id number]  Primary Care Provider:  Benetta Spar, MD  Patient is currently active with Care Management for chronic disease management services.  Patient has been engaged by a  community RNCM and Child psychotherapist.  Our community based plan of care has focused on disease management and community resource support.   Patient will receive a post hospital call and will be evaluated for assessments and disease process education.   Will collaborate with [facility] team involved with this pt.   Plan: Current recommendations for [facility] PT   Of note, Care Management services does not replace or interfere with any services that are needed or arranged by inpatient [facility] care management team.   For additional questions or referrals please contact:  Elliot Cousin, RN, [facility] Liaison [place]   [facility], Population Health Office Hours MTWF  8:00 am-6:00 pm Direct Dial: [phone] mobile [phone] [Office toll free line] Office Hours are M-F 8:30 - 5 pm [email or web address]

## 2022-12-23 NOTE — Progress Notes (Signed)
    Attempted 67F PICC with 26% occupancy to brachial vein on right upper arm.  Guidewire in easily and successfully, cath would only terminate in right jugular despite repositioning and troubleshooting multiple times. Procedure ended. Held pressure for 5 minutes. Glue and dressing applied. No bleeding noted to site. VAST at [facility] aware to come attempt. Primary nurse, Tori and Dr Tat aware.

## 2022-12-23 NOTE — Progress Notes (Signed)
Subjective: Still upset about the multiple attempts at a PICC line yesterday.  Objective: Vital signs in last 24 hours: Temp:  [97.3 F (36.3 C)-98.6 F (37 C)] 97.3 F (36.3 C) (12/13 0752) Pulse Rate:  [74-84] 83 (12/13 0900) Resp:  [15-30] 30 (12/13 1000) BP: (83-122)/(40-81) 107/62 (12/13 0900) SpO2:  [96 %-100 %] 99 % (12/13 0900) Weight:  [91.5 kg-92 kg] 92 kg (12/13 0640) Last BM Date : 12/22/22  Intake/Output from previous day: 12/12 0701 - 12/13 0700 In: 1503.2 [P.O.:720; I.V.:27.7; Blood:280.7; IV Piggyback:474.9] Out: 1125 [Urine:1125] Intake/Output this shift: Total I/O In: 240 [P.O.:240] Out: -   General appearance: alert, cooperative, and no distress Pelvic: Bright buttock abscess resolved.  I did incise the indurated area along the left buttock, but no purulent drainage was found.  Lab Results:  Recent Labs    12/22/22 0442 12/23/22 0433  WBC 18.7* 19.1*  HGB 7.7* 8.6*  HCT 27.6* 30.1*  PLT 446* 381   BMET Recent Labs    12/22/22 0442 12/23/22 0433  NA 134* 132*  K 4.4 4.8  CL 103 103  CO2 21* 20*  GLUCOSE 257* 195*  BUN 25* 23  CREATININE 1.48* 1.45*  CALCIUM 8.2* 8.1*   PT/INR No results for input(s): "LABPROT", "INR" in the last 72 hours.  Studies/Results: ECHOCARDIOGRAM LIMITED Result Date: 12/22/2022    ECHOCARDIOGRAM LIMITED REPORT   Patient Name:   [name] Date of Exam: 12/22/2022 Medical Rec #:  [id number]        Height:       69.0 in Accession #:    [id number]       Weight:       201.7 lb Date of Birth:  1955/12/19        BSA:          2.074 m Patient Age:    67 years         BP:           108/81 mmHg Patient Gender: M                HR:           77 bpm. [facility] Procedure: Limited Echo and Intracardiac Opacification Agent Indications:    CHF- Acute Systolic l50.21  History:        Patient has prior history of Echocardiogram examinations, most                 recent 10/21/2022. CHF and Cardiomyopathy, CAD  and Previous                 Myocardial Infarction, Stroke; Risk Factors:Hypertension,                 Diabetes, Dyslipidemia and Sleep Apnea.  Sonographer:    Celesta Gentile RCS Referring Phys: [phone] DAVID TAT IMPRESSIONS  1. Left ventricular ejection fraction, by estimation, is 30 to 35%. The left ventricle has moderately decreased function. The left ventricle demonstrates regional wall motion abnormalities (see scoring diagram/findings for description). The left ventricular internal cavity size was mildly dilated. There is moderate asymmetric left ventricular hypertrophy of the septal segment. There is the interventricular septum is flattened in systole and diastole, consistent with right ventricular pressure and volume overload. No evidence of LV thrombus on contrast images.  2. Right ventricular systolic function is normal. The right ventricular size is normal. Tricuspid regurgitation signal is inadequate for assessing PA pressure.  3. The inferior vena  cava is dilated in size with <50% respiratory variability, suggesting right atrial pressure of 15 mmHg. Comparison(s): Changes from prior study are noted. LVEF improved from 20-25% to 30-35%. FINDINGS  Left Ventricle: Left ventricular ejection fraction, by estimation, is 30 to 35%. The left ventricle has moderately decreased function. The left ventricle demonstrates regional wall motion abnormalities. Definity contrast agent was given IV to delineate the left ventricular endocardial borders. The left ventricular internal cavity size was mildly dilated. There is moderate asymmetric left ventricular hypertrophy of the septal segment. The interventricular septum is flattened in systole and diastole, consistent with right ventricular pressure and volume overload.  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 The entire anterior septum, apical lateral segment, mid inferoseptal segment, apical anterior segment, basal inferior segment, and apical inferior segment are  akinetic. The anterior wall, basal anterolateral segment, mid inferior segment, and basal inferoseptal segment are hypokinetic. The posterior wall and mid anterolateral segment are normal. Right Ventricle: The right ventricular size is normal. No increase in right ventricular wall thickness. Right ventricular systolic function is normal. Tricuspid regurgitation signal is inadequate for assessing PA pressure. Left Atrium: Left atrial size was not assessed. Right Atrium: Right atrial size was not assessed. Pericardium: There is no evidence of pericardial effusion. Mitral Valve: The mitral valve is normal in structure. Tricuspid Valve: The tricuspid valve is normal in structure. Aortic Valve: The aortic valve is normal in structure. Pulmonic Valve: The pulmonic valve was not well visualized. Aorta: The aortic root is normal in size and structure. Venous: The inferior vena cava is dilated in size with less than 50% respiratory variability, suggesting right atrial pressure of 15 mmHg. LEFT VENTRICLE PLAX 2D LVIDd:         5.85 cm LVIDs:         4.35 cm LV PW:         1.00 cm LV IVS:        1.30 cm LVOT diam:     2.00 cm LVOT Area:     3.14 cm  LV Volumes (MOD) LV vol d, MOD A2C: 149.0 ml LV vol d, MOD A4C: 125.0 ml LV vol s, MOD A2C: 104.0 ml LV vol s, MOD A4C: 89.9 ml LV SV MOD A2C:     45.0 ml LV SV MOD A4C:     125.0 ml LV SV MOD BP:      40.2 ml LEFT ATRIUM         Index LA diam:    4.60 cm 2.22 cm/m   AORTA Ao Root diam: 3.70 cm  SHUNTS Systemic Diam: 2.00 cm Vishnu Priya Mallipeddi Electronically signed by Winfield Rast Mallipeddi Signature Date/Time: 12/22/2022/5:16:42 PM    Final    [place] EKG SITE RITE Result Date: 12/21/2022 If Site Rite image not attached, placement could not be confirmed due to current cardiac rhythm.   Anti-infectives: Anti-infectives (From admission, onward)    Start     Dose/Rate Route Frequency Ordered Stop   12/24/22 0800  vancomycin (VANCOREADY) IVPB 1250 mg/250 mL        1,250  mg 166.7 mL/hr over 90 Minutes Intravenous Every 24 hours 12/23/22 1041     12/20/22 0800  vancomycin (VANCOCIN) IVPB 1000 mg/200 mL premix  Status:  Discontinued        1,000 mg 200 mL/hr over 60 Minutes Intravenous Every 24 hours 12/19/22 0914 12/23/22 1041   12/19/22 2200  cefTRIAXone (ROCEPHIN) 2 g in sodium chloride 0.9 % 100 mL IVPB  2 g 200 mL/hr over 30 Minutes Intravenous Every 24 hours 12/19/22 0854     12/19/22 1230  metroNIDAZOLE (FLAGYL) IVPB 500 mg        500 mg 100 mL/hr over 60 Minutes Intravenous Every 12 hours 12/19/22 1141     12/19/22 0400  vancomycin (VANCOCIN) IVPB 1000 mg/200 mL premix       Placed in "Followed by" Linked Group   1,000 mg 200 mL/hr over 60 Minutes Intravenous  Once 12/19/22 0221 12/19/22 1029   12/19/22 0300  vancomycin (VANCOCIN) IVPB 1000 mg/200 mL premix       Placed in "Followed by" Linked Group   1,000 mg 200 mL/hr over 60 Minutes Intravenous  Once 12/19/22 0221 12/19/22 1029   12/18/22 2345  cefTRIAXone (ROCEPHIN) 2 g in sodium chloride 0.9 % 100 mL IVPB        2 g 200 mL/hr over 30 Minutes Intravenous  Once 12/18/22 2334 12/19/22 0208       Assessment/Plan: Impression: Right buttock abscess, resolved.  No abscess cavity found on incision of left buttock indurated area.  At this point, it is unknown why the patient has a persistent leukocytosis.  This was discussed with Dr. Arbutus Leas.  May need follow-up CT scan.  Patient refuses central line placement.  LOS: 4 days    Franky Macho 12/23/2022

## 2022-12-23 NOTE — Plan of Care (Signed)

## 2022-12-23 NOTE — Progress Notes (Signed)
Pt lost IV site and refused new access, pt educated on need of IV site for IV abt therapy, pt continued to refuse, hospitalist made aware.

## 2022-12-23 NOTE — Evaluation (Signed)
Physical Therapy Evaluation Patient Details Name: [name] MRN: [id number] DOB: 1955-10-25 Today's Date: 12/23/2022  History of Present Illness  [name] is a 67 y.o. male with medical history significant for chronic combined diastolic and systolic CHF, hypertension, hyperlipidemia, type 2 diabetes, diabetic polyneuropathy, iron deficiency anemia, anemia of chronic disease, CKD 3B, obesity, polysubstance abuse including cocaine, who presented to the ED after a fall in the shower.  Endorses having pain all over.  Also complains of perianal pain.   Clinical Impression  Patient demonstrates good return for bed mobility, transfers and ambulating in room, hallway pushing IV pole without loss of balance.  Patient encouraged to ambulate in room ad lib and with nursing staff as tolerated.  Patient tolerated sitting up in chair after therapy.  Patient will benefit from continued skilled physical therapy in hospital and recommended venue below to increase strength, balance, endurance for safe ADLs and gait.          If plan is discharge home, recommend the following: A little help with walking and/or transfers;A little help with bathing/dressing/bathroom;Help with stairs or ramp for entrance;Assistance with cooking/housework   Can travel by private vehicle        Equipment Recommendations None recommended by PT  Recommendations for Other Services       Functional Status Assessment Patient has had a recent decline in their functional status and demonstrates the ability to make significant improvements in function in a reasonable and predictable amount of time.     Precautions / Restrictions Precautions Precautions: Fall Restrictions Weight Bearing Restrictions Per Provider Order: No      Mobility  Bed Mobility Overal bed mobility: Modified Independent                  Transfers Overall transfer level: Modified independent                       Ambulation/Gait Ambulation/Gait assistance: Supervision, Contact guard assist Gait Distance (Feet): 100 Feet Assistive device: None Gait Pattern/deviations: Decreased step length - right, Decreased step length - left, Decreased stride length Gait velocity: decreased     General Gait Details: slightly labored cadence with good return for pushing IV pole without loss of balance  Stairs            Wheelchair Mobility     Tilt Bed    Modified Rankin (Stroke Patients Only)       Balance Overall balance assessment: Needs assistance Sitting-balance support: Feet supported, No upper extremity supported Sitting balance-Leahy Scale: Good Sitting balance - Comments: seated at EOB   Standing balance support: During functional activity, Single extremity supported Standing balance-Leahy Scale: Fair Standing balance comment: fair/good using IV pole                             Pertinent Vitals/Pain Pain Assessment Pain Assessment: No/denies pain    Home Living Family/patient expects to be discharged to:: Private residence Living Arrangements: Non-relatives/Friends Available Help at Discharge: Family;Friend(s);Available PRN/intermittently;Personal care attendant Type of Home: Mobile home Home Access: Stairs to enter Entrance Stairs-Rails: Right;Left;Can reach both Entrance Stairs-Number of Steps: 2   Home Layout: One level Home Equipment: Rollator (4 wheels);Shower seat;Toilet riser      Prior Function Prior Level of Function : Independent/Modified Independent             Mobility Comments: Household ambulation using Rollator ADLs Comments: Doesn't drive and  says he has help with shopping. independent with ADLs     Extremity/Trunk Assessment   Upper Extremity Assessment Upper Extremity Assessment: Defer to OT evaluation    Lower Extremity Assessment Lower Extremity Assessment: Generalized weakness    Cervical / Trunk Assessment Cervical /  Trunk Assessment: Normal  Communication   Communication Communication: No apparent difficulties  Cognition Arousal: Alert Behavior During Therapy: WFL for tasks assessed/performed Overall Cognitive Status: Within Functional Limits for tasks assessed                                          General Comments      Exercises     Assessment/Plan    PT Assessment Patient needs continued PT services  PT Problem List Decreased strength;Decreased activity tolerance;Decreased balance;Decreased mobility       PT Treatment Interventions DME instruction;Gait training;Stair training;Functional mobility training;Therapeutic activities;Therapeutic exercise;Balance training;Patient/family education    PT Goals (Current goals can be found in the Care Plan section)  Acute Rehab PT Goals Patient Stated Goal: return home with friends to assist PT Goal Formulation: With patient Time For Goal Achievement: 12/30/22 Potential to Achieve Goals: Good    Frequency Min 3X/week     Co-evaluation               AM-PAC PT "6 Clicks" Mobility  Outcome Measure Help needed turning from your back to your side while in a flat bed without using bedrails?: None Help needed moving from lying on your back to sitting on the side of a flat bed without using bedrails?: None Help needed moving to and from a bed to a chair (including a wheelchair)?: None Help needed standing up from a chair using your arms (e.g., wheelchair or bedside chair)?: None Help needed to walk in hospital room?: A Little Help needed climbing 3-5 steps with a railing? : A Little 6 Click Score: 22    End of Session   Activity Tolerance: Patient tolerated treatment well;Patient limited by fatigue Patient left: in chair;with call bell/phone within reach Nurse Communication: Mobility status PT Visit Diagnosis: Unsteadiness on feet (R26.81);Other abnormalities of gait and mobility (R26.89);Muscle weakness (generalized)  (M62.81)    Time: 1610-9604 PT Time Calculation (min) (ACUTE ONLY): 17 min   Charges:   PT Evaluation $PT Eval Low Complexity: 1 Low PT Treatments $Therapeutic Activity: 8-22 mins PT General Charges $$ ACUTE PT VISIT: 1 Visit         4:18 PM, 12/23/22 [name], MPT Physical Therapist with [facility] [phone] office [phone] mobile phone

## 2022-12-23 NOTE — Progress Notes (Signed)
OT Cancellation Note  Patient Details Name: [name] MRN: [id number] DOB: [birth year]   Cancelled Treatment:  Pt refusing to get out of bed or move at this time will f/u as schedule allows      Bevelyn Ngo, OTR/L 12/23/2022, 2:18 PM

## 2022-12-23 NOTE — Patient Outreach (Addendum)
  Care Coordination   Follow Up Visit Note   12/23/2022 Name: [name] MRN: [id number] DOB: 01/14/1955  [name] is a 67 y.o. year old male who sees Russell, [name], MD for primary care. I  spoke with daughter, [name], by telephone today.   What matters to the patients health and wellness today?  Transfer to [facility] in [place] for short term rehab after hospital discharge   Goals Addressed             This Visit's Progress    Care Management Needs   Not on track    Care Management Goals: Patient/daughter will talk with inpatient staff Re: desire for discharge to SNF for short term rehab after hospital discharge After discharge: Patient will keep all medical appointments Patient will reach out to provider with any new or worsening symptoms Patient will take medication as prescribed Patient will obtain a blood pressure monitor Patient will obtain a glucose meter Patient will obtain scales Patient will obtain shower chair Patient will use rolling walker for ambulation Patient will work with HHPT for strengthening and gait training Patient/caregiver will reach out to RN Care Manager at [phone] as needed        SDOH assessments and interventions completed:  No     Care Coordination Interventions:  Yes, provided  Interventions Today    Flowsheet Row Most Recent Value  Chronic Disease   Chronic disease during today's visit Congestive Heart Failure (CHF), Diabetes, Hypertension (HTN), Other  [Meningioma with Encephalopathy & Cerebral Edema]  General Interventions   General Interventions Discussed/Reviewed General Interventions Discussed, General Interventions Reviewed, Communication with, Doctor Visits  Doctor Visits Discussed/Reviewed Doctor Visits Reviewed, Specialist  [reviewed inpatient notes]  Communication with Social Work, Charity fundraiser  [Secure Chat to [employer] LCSW Inpatient TOC advising that per daughter, patient is agreeable to  discharge to SNF for short term rehab if PT believes it is appropriate. Staff message to Elliot Cousin, RN asking if she can update me on discharge plans.]  Education Interventions   Provided Verbal Education On Other  [daughter/patient need to communicate with inpatient staff regarding desire for transfer to SNF for short term rehab after discharge]       Follow up plan:  Awaiting response from inpatient [facility] and [facility] Liaison. Daughter will call me back once they have discharge information.     Encounter Outcome:  Patient Visit Completed   Demetrios Loll, RN, BSN Care Manager [place]  [facility]  Population Health  Direct Dial: [phone] Main #: [phone]  Addendum: Received call from patient's daughter. Patient was evaluated by inpatient Physical Therapy and was able to ambulate. SNF is not needed for rehab. Plan to discharge home tomorrow with HHPT and RN services if all goes as planned. Follow-up call scheduled for 12/26/22.

## 2022-12-23 NOTE — Progress Notes (Signed)
PROGRESS NOTE  [name]:[id number] DOB: 01-03-1956 DOA: 12/18/2022 PCP: Benetta Spar, MD  Brief History:  67 y.o. male with a history of CAD, tobacco use, HTN, HLD, T2DM, GERD, history of cocaine use, OSA, and combined CHF (LVEF 20-25%, G2DD), DM polyneuropathy, iron deficiency anemia who presented.  after a fall in the shower. Endorses having pain all over. Also complains of perianal pain.   In the ED, tachycardic with soft BPs, lab studies notable for leukocytosis 24.7K.  Perianal abscesses noted on exam and confirmed on noncontrast CT abdomen and pelvis.  Code sepsis was called in the ED.  The patient was started on empiric IV antibiotics, Rocephin and IV vancomycin.  Admitted by [facility], hospitalist service.   ED Course: Temperature 99.  BP 96/59, pulse 103, respiratory 17, saturation 99% on room air.  Labs today notable for WBC 24.7, hemoglobin 7.9, platelet count 610.  Creatinine 2.05 from baseline 1.76.  Notably, the patient was recently mated to the hospital from 11/02/2022 to 11/15/2022 for respiratory failure secondary to acute on chronic HFrEF.  During that hospitalization, the patient was also seen by GI for acute on chronic anemia.  He underwent EGD and colonoscopy on 11/11/2022.  EGD showed erythematous mucosa in the antrum and duodenitis.  Colonoscopy showed 3 polyps in the transverse colon, 1 in the ascending colon.  His iron deficiency was felt to be due to large polyps of 1 with visible oozing.  He was given Venofer and started on ferrous sulfate at time of discharge.  General surgery was consulted.  Bedside debridement was performed on the right perianal abscess.  Cultures were sent.  The patient was started vancomycin, ceftriaxone, and metronidazole.   Assessment/Plan: Severe Sepsis -Presented with tachycardia and leukocytosis and elevated lactic acid -Secondary to perirectal abscess -general surgery following -s/p bedside I&D on right  abscess -12/19/22 CT AP--bilateral buttock abscess R>L -BPs remain soft but improving -increase midodrine to 10 mg TID -given one liter LR due to soft BP and elevated lactate on 12/12 -repeat limited 12/12 Echo EF 30-35%   Perirectal abscess -general surgery following -s/p bedside I&D on right abscess -12/19/22 CT AP--bilateral buttock abscess R>L -12/19/22 culture--flora -continue vanc/ceftriaxone/metronidazole -repeat CT pelvis   Bacteremia -represents contaminant -diphtheroids in 1/2 sets -staph epi in 1/2 sets   Chronic HFrEF -clinically euvolemic -unable to restart coreg due to soft BPs -10/21/22 Echo--EF 20-25%, G2DD, mild MR -transitioned to po torsemide  20 mg daily at time of last d/c -repeat limited 12/12 Echo EF 30-35%   CKD 3b -baseline creatinine 1.6-1.9 -monitor serial BMP   Meningioma -12/19/22 CT brain--stable edema right temporal lobe possibly related to known right middle cranial fossa meningioma   Iron Deficiency Anemia -baseline Hgb ~8 -11/08/22 iron saturation 8%, ferritin 13  -11/08/22 folate 7.4--continue folate supplementation -11/1 EGD--ertythematous mucosa in antrum, +duodenitis -11/1 colonoscopy--3 polyps in transverse colon, one in ascending colon;  iron deficiency due to large polyps with one with visible oozing -transfused one unite PRBC 12/10 -repeat iron studies--iron saturation 12%, ferritin 23 -continue pantoprazole -no signs of active bleeding -transfuse addition unit PRBC 12/12 due to patient's cardiomyopathy (2 units total for the admission)   Polysubstance abuse -12/9 UDS negative -Patient continues to smoke -Tobacco cessation discussed   Diabetes mellitus type 2 with hyperglycemia --10/21/22 HbA1c was 6.3%  --started novolog sliding scale   CAD -No chest pain. Later did have atypical chest pain likely musculoskeletal with  persistently negative troponin and no ischemic ECG changes.  - Continue ASA, statin -BB on hold due to  soft BPs   OSA:  - CPAP at bedtime   Peripheral neuropathy:  - Continue gabapentin   PAD: Chronic left common iliac occlusion on CTA with distal reconstution.  - Needs vascular surgery evaluation as outpatient.             Family Communication:  daughter updated 12/13   Consultants:  general surgery   Code Status:  FULL    DVT Prophylaxis:  [place] Heparin      Procedures: As Listed in Progress Note Above   Antibiotics: Vanc 12/9>> Ceftriaxone 12/9>> Flagyl 12/9>>        Subjective: Patient denies fevers, chills, headache, chest pain, dyspnea, nausea, vomiting, diarrhea, abdominal pain, dysuria, hematuria, hematochezia, and melena. Pt states his buttocks is feeling better with minimal pain.   No scrotal or perineal pain  Objective: Vitals:   12/23/22 1300 12/23/22 1400 12/23/22 1500 12/23/22 1636  BP: 125/72 (!) 100/52 (!) 112/53 (!) 105/55  Pulse:      Resp:  (!) 25 (!) 22 17  Temp:      TempSrc:      SpO2:   99%   Weight:      Height:        Intake/Output Summary (Last 24 hours) at 12/23/2022 1649 Last data filed at 12/23/2022 0800 Gross per 24 hour  Intake 900.87 ml  Output 1000 ml  Net -99.13 ml   Weight change: -1.7 kg Exam:  General:  Pt is alert, follows commands appropriately, not in acute distress HEENT: No icterus, No thrush, No neck mass, [place]/AT Cardiovascular: RRR, S1/S2, no rubs, no gallops Respiratory: CTA bilaterally, no wheezing, no crackles, no rhonchi Abdomen: Soft/+BS, non tender, non distended, no guarding Extremities: No edema, No lymphangitis, No petechiae, No rashes, no synovitis Buttock--minimal induration of bilateral gluteal areas without pus or necrosis.  No erythema or open wounds in perineum   Data Reviewed: I have personally reviewed following labs and imaging studies Basic Metabolic Panel: Recent Labs  Lab 12/19/22 0437 12/20/22 0452 12/21/22 0511 12/22/22 0442 12/23/22 0433  NA 136 133* 133* 134* 132*  K  4.8 4.6 4.3 4.4 4.8  CL 101 103 101 103 103  CO2 24 23 20* 21* 20*  GLUCOSE 151* 126* 202* 257* 195*  BUN 28* 30* 32* 25* 23  CREATININE 1.96* 2.08* 2.04* 1.48* 1.45*  CALCIUM 8.8* 8.1* 8.0* 8.2* 8.1*  MG 2.6* 2.4  --  2.3  --   PHOS 3.8 3.7  --   --   --    Liver Function Tests: Recent Labs  Lab 12/18/22 2344  AST 14*  ALT 11  ALKPHOS 128*  BILITOT 0.7  PROT 7.9  ALBUMIN 2.9*   No results for input(s): "LIPASE", "AMYLASE" in the last 168 hours. No results for input(s): "AMMONIA" in the last 168 hours. Coagulation Profile: Recent Labs  Lab 12/18/22 2344  INR 1.2   CBC: Recent Labs  Lab 12/18/22 2344 12/19/22 0437 12/20/22 0452 12/21/22 0511 12/22/22 0442 12/23/22 0433  WBC 24.7* 24.2* 18.9* 18.6* 18.7* 19.1*  NEUTROABS 21.0*  --   --   --   --   --   HGB 7.9* 7.8* 6.8* 7.5* 7.7* 8.6*  HCT 29.6* 29.6* 25.7* 25.8* 27.6* 30.1*  MCV 76.1* 76.9* 75.4* 75.4* 77.1* 78.4*  PLT 610* 604* 501* 434* 446* 381   Cardiac Enzymes: Recent Labs  Lab  12/18/22 2344  CKTOTAL 88   BNP: Invalid input(s): "POCBNP" CBG: Recent Labs  Lab 12/18/22 2327  GLUCAP 122*   HbA1C: No results for input(s): "HGBA1C" in the last 72 hours. Urine analysis:    Component Value Date/Time   COLORURINE YELLOW 12/19/2022 0738   APPEARANCEUR CLEAR 12/19/2022 0738   APPEARANCEUR Clear 08/02/2021 1051   LABSPEC 1.014 12/19/2022 0738   PHURINE 6.0 12/19/2022 0738   GLUCOSEU >=500 (A) 12/19/2022 0738   HGBUR NEGATIVE 12/19/2022 0738   BILIRUBINUR NEGATIVE 12/19/2022 0738   BILIRUBINUR Negative 08/02/2021 1051   KETONESUR NEGATIVE 12/19/2022 0738   PROTEINUR 30 (A) 12/19/2022 0738   UROBILINOGEN 0.2 09/27/2014 2237   NITRITE NEGATIVE 12/19/2022 0738   LEUKOCYTESUR NEGATIVE 12/19/2022 0738   Sepsis Labs: @LABRCNTIP (procalcitonin:4,lacticidven:4) ) Recent Results (from the past 240 hours)  Resp panel by RT-PCR (RSV, Flu A&B, Covid) Anterior Nasal Swab     Status: None   Collection  Time: 12/18/22 11:44 PM   Specimen: Anterior Nasal Swab  Result Value Ref Range Status   SARS Coronavirus 2 by RT PCR NEGATIVE NEGATIVE Final    Comment: (NOTE) SARS-CoV-2 target nucleic acids are NOT DETECTED.  The SARS-CoV-2 RNA is generally detectable in upper respiratory specimens during the acute phase of infection. The lowest concentration of SARS-CoV-2 viral copies this assay can detect is 138 copies/mL. A negative result does not preclude SARS-Cov-2 infection and should not be used as the sole basis for treatment or other patient management decisions. A negative result may occur with  improper specimen collection/handling, submission of specimen other than nasopharyngeal swab, presence of viral mutation(s) within the areas targeted by this assay, and inadequate number of viral copies(<138 copies/mL). A negative result must be combined with clinical observations, patient history, and epidemiological information. The expected result is Negative.  Fact Sheet for Patients:  BloggerCourse.com  Fact Sheet for Healthcare Providers:  SeriousBroker.it  This test is no t yet approved or cleared by the [place] FDA and  has been authorized for detection and/or diagnosis of SARS-CoV-2 by FDA under an Emergency Use Authorization (EUA). This EUA will remain  in effect (meaning this test can be used) for the duration of the COVID-19 declaration under Section 564(b)(1) of the Act, 21 U.S.C.section 360bbb-3(b)(1), unless the authorization is terminated  or revoked sooner.       Influenza A by PCR NEGATIVE NEGATIVE Final   Influenza B by PCR NEGATIVE NEGATIVE Final    Comment: (NOTE) The Xpert Xpress SARS-CoV-2/FLU/RSV plus assay is intended as an aid in the diagnosis of influenza from Nasopharyngeal swab specimens and should not be used as a sole basis for treatment. Nasal washings and aspirates are unacceptable for Xpert Xpress  SARS-CoV-2/FLU/RSV testing.  Fact Sheet for Patients: BloggerCourse.com  Fact Sheet for Healthcare Providers: SeriousBroker.it  This test is not yet approved or cleared by the [place] FDA and has been authorized for detection and/or diagnosis of SARS-CoV-2 by FDA under an Emergency Use Authorization (EUA). This EUA will remain in effect (meaning this test can be used) for the duration of the COVID-19 declaration under Section 564(b)(1) of the Act, 21 U.S.C. section 360bbb-3(b)(1), unless the authorization is terminated or revoked.     Resp Syncytial Virus by PCR NEGATIVE NEGATIVE Final    Comment: (NOTE) Fact Sheet for Patients: BloggerCourse.com  Fact Sheet for Healthcare Providers: SeriousBroker.it  This test is not yet approved or cleared by the [place] FDA and has been authorized for detection and/or diagnosis  of SARS-CoV-2 by FDA under an Emergency Use Authorization (EUA). This EUA will remain in effect (meaning this test can be used) for the duration of the COVID-19 declaration under Section 564(b)(1) of the Act, 21 U.S.C. section 360bbb-3(b)(1), unless the authorization is terminated or revoked.  Performed at [facility], [address] [place], [place] [address]   Blood Culture (routine x 2)     Status: Abnormal   Collection Time: 12/18/22 11:44 PM   Specimen: Right Antecubital; Blood  Result Value Ref Range Status   Specimen Description   Final    RIGHT ANTECUBITAL Performed at [facility], [address] [place], [place] [address]    Special Requests   Final    BOTTLES DRAWN AEROBIC AND ANAEROBIC Blood Culture adequate volume Performed at [facility], [address], [place], [place] [address]    Culture  Setup Time   Final    GRAM POSITIVE RODS AEROBIC BOTTLE ONLY CRITICAL RESULT CALLED TO, READ BACK BY AND VERIFIED WITH: ANGEL Ray at 1705  12/20/2022 by T KENNEDY PREVIOUSLY REPORTED AS: GRAM POSITIVE COCCI CORRECTED RESULTS CALLED TO: RN Ethelene Hal [phone] @ 2247 FH    Culture (A)  Final    CORYNEBACTERIUM AMYCOLATUM Standardized susceptibility testing for this organism is not available. Performed at [facility], [address], [place], [place] [address]    Report Status 12/23/2022 FINAL  Final  Blood Culture (routine x 2)     Status: Abnormal   Collection Time: 12/19/22 12:15 AM   Specimen: BLOOD LEFT HAND  Result Value Ref Range Status   Specimen Description   Final    BLOOD LEFT HAND Performed at [facility], [address] [place], [place] [address]    Special Requests   Final    BOTTLES DRAWN AEROBIC AND ANAEROBIC Blood Culture adequate volume Performed at [facility], [address], [place], [place] [address]    Culture  Setup Time   Final    GRAM POSITIVE COCCI AEROBIC BOTTLE Gram Stain Report Called to,Read Back By and Verified With: VILLALOBOS,S ON 12/19/22 AT 2300 BY LOY,C Organism ID to follow CRITICAL RESULT CALLED TO, READ BACK BY AND VERIFIED WITH: S VILLALOBOS,RN@0340  12/20/22 MK    Culture (A)  Final    STAPHYLOCOCCUS EPIDERMIDIS THE SIGNIFICANCE OF ISOLATING THIS ORGANISM FROM A SINGLE SET OF BLOOD CULTURES WHEN MULTIPLE SETS ARE DRAWN IS UNCERTAIN. PLEASE NOTIFY THE MICROBIOLOGY DEPARTMENT WITHIN ONE WEEK IF SPECIATION AND SENSITIVITIES ARE REQUIRED. Performed at [facility], [address] [place], [place] [address]    Report Status 12/22/2022 FINAL  Final  Blood Culture ID Panel (Reflexed)     Status: Abnormal   Collection Time: 12/19/22 12:15 AM  Result Value Ref Range Status   Enterococcus faecalis NOT DETECTED NOT DETECTED Final   Enterococcus Faecium NOT DETECTED NOT DETECTED Final   Listeria monocytogenes NOT DETECTED NOT DETECTED Final   Staphylococcus species DETECTED (A) NOT DETECTED Final    Comment: CRITICAL RESULT CALLED TO, READ BACK BY AND VERIFIED WITH: S  VILLALOBOS,RN@0340  12/20/22 MK    Staphylococcus aureus (BCID) NOT DETECTED NOT DETECTED Final   Staphylococcus epidermidis DETECTED (A) NOT DETECTED Final    Comment: Methicillin (oxacillin) resistant coagulase negative staphylococcus. Possible blood culture contaminant (unless isolated from more than one blood culture draw or clinical case suggests pathogenicity). No antibiotic treatment is indicated for blood  culture contaminants. CRITICAL RESULT CALLED TO, READ BACK BY AND VERIFIED WITH: S VILLALOBOS,RN@0340  12/20/22  MK    Staphylococcus lugdunensis NOT DETECTED NOT DETECTED Final   Streptococcus species NOT DETECTED NOT DETECTED Final   Streptococcus agalactiae NOT DETECTED NOT DETECTED Final   Streptococcus pneumoniae NOT DETECTED NOT DETECTED Final   Streptococcus pyogenes NOT DETECTED NOT DETECTED Final   A.calcoaceticus-baumannii NOT DETECTED NOT DETECTED Final   Bacteroides fragilis NOT DETECTED NOT DETECTED Final   Enterobacterales NOT DETECTED NOT DETECTED Final   Enterobacter cloacae complex NOT DETECTED NOT DETECTED Final   Escherichia coli NOT DETECTED NOT DETECTED Final   Klebsiella aerogenes NOT DETECTED NOT DETECTED Final   Klebsiella oxytoca NOT DETECTED NOT DETECTED Final   Klebsiella pneumoniae NOT DETECTED NOT DETECTED Final   Proteus species NOT DETECTED NOT DETECTED Final   Salmonella species NOT DETECTED NOT DETECTED Final   Serratia marcescens NOT DETECTED NOT DETECTED Final   Haemophilus influenzae NOT DETECTED NOT DETECTED Final   Neisseria meningitidis NOT DETECTED NOT DETECTED Final   Pseudomonas aeruginosa NOT DETECTED NOT DETECTED Final   Stenotrophomonas maltophilia NOT DETECTED NOT DETECTED Final   Candida albicans NOT DETECTED NOT DETECTED Final   Candida auris NOT DETECTED NOT DETECTED Final   Candida glabrata NOT DETECTED NOT DETECTED Final   Candida krusei NOT DETECTED NOT DETECTED Final   Candida parapsilosis NOT DETECTED NOT DETECTED Final    Candida tropicalis NOT DETECTED NOT DETECTED Final   Cryptococcus neoformans/gattii NOT DETECTED NOT DETECTED Final   Methicillin resistance mecA/C DETECTED (A) NOT DETECTED Final    Comment: CRITICAL RESULT CALLED TO, READ BACK BY AND VERIFIED WITH: S VILLALOBOS,RN@0340  12/20/22 MK Performed at [facility], [address] [place], [place] [address]   MRSA Next Gen by PCR, Nasal     Status: None   Collection Time: 12/19/22  5:07 AM   Specimen: Nasal Mucosa; Nasal Swab  Result Value Ref Range Status   MRSA by PCR Next Gen NOT DETECTED NOT DETECTED Final    Comment: (NOTE) The GeneXpert MRSA Assay (FDA approved for NASAL specimens only), is one component of a comprehensive MRSA colonization surveillance program. It is not intended to diagnose MRSA infection nor to guide or monitor treatment for MRSA infections. Test performance is not FDA approved in patients less than 46 years old. Performed at [facility], [address], [place], [place] [address]   Aerobic Culture w Gram Stain (superficial specimen)     Status: None   Collection Time: 12/19/22  2:40 PM   Specimen: Abscess  Result Value Ref Range Status   Specimen Description   Final    ABSCESS Performed at [facility], [address] [place], [place] [address]    Special Requests   Final    BUTTOCKS Performed at [facility], [address], [place], [place] [address]    Gram Stain   Final    FEW WBC PRESENT,BOTH PMN AND MONONUCLEAR RARE GRAM POSITIVE COCCI IN PAIRS RARE GRAM POSITIVE COCCI IN CHAINS    Culture   Final    RARE NORMAL SKIN FLORA Performed at [facility], [address] [place], [place] [address]    Report Status 12/22/2022 FINAL  Final  MRSA Next Gen by PCR, Nasal     Status: None   Collection Time: 12/22/22 11:30 AM   Specimen: Nasal Mucosa; Nasal Swab  Result Value Ref Range Status   MRSA by PCR Next Gen NOT DETECTED NOT DETECTED Final    Comment: (NOTE) The GeneXpert MRSA Assay (FDA  approved for  NASAL specimens only), is one component of a comprehensive MRSA colonization surveillance program. It is not intended to diagnose MRSA infection nor to guide or monitor treatment for MRSA infections. Test performance is not FDA approved in patients less than 55 years old. Performed at [facility], [address], [place], [place] [address]      Scheduled Meds:  atorvastatin  40 mg Oral Daily   Chlorhexidine Gluconate Cloth  6 each Topical Q0600   feeding supplement  237 mL Oral BID BM   ferrous sulfate  325 mg Oral Q breakfast   folic acid  1 mg Oral Daily   gabapentin  100 mg Oral TID   heparin  5,000 Units Subcutaneous Q8H   linaclotide  145 mcg Oral QAC breakfast   midodrine  10 mg Oral TID WC   multivitamin with minerals  1 tablet Oral Daily   pantoprazole  40 mg Oral Daily   Continuous Infusions:  cefTRIAXone (ROCEPHIN)  IV 2 g (12/22/22 2148)   metronidazole 500 mg (12/23/22 1209)   [START ON 12/24/2022] vancomycin      Procedures/Studies: ECHOCARDIOGRAM LIMITED Result Date: 12/22/2022    ECHOCARDIOGRAM LIMITED REPORT   Patient Name:   [name] Date of Exam: 12/22/2022 Medical Rec #:  [id number]        Height:       69.0 in Accession #:    [id number]       Weight:       201.7 lb Date of Birth:  [birth year]        BSA:          2.074 m Patient Age:    67 years         BP:           108/81 mmHg Patient Gender: M                HR:           77 bpm. [facility] Procedure: Limited Echo and Intracardiac Opacification Agent Indications:    CHF- Acute Systolic l50.21  History:        Patient has prior history of Echocardiogram examinations, most                 recent 10/21/2022. CHF and Cardiomyopathy, CAD and Previous                 Myocardial Infarction, Stroke; Risk Factors:Hypertension,                 Diabetes, Dyslipidemia and Sleep Apnea.  Sonographer:    Celesta Gentile RCS Referring Phys: [phone] Cyrena Kuchenbecker IMPRESSIONS  1. Left ventricular ejection  fraction, by estimation, is 30 to 35%. The left ventricle has moderately decreased function. The left ventricle demonstrates regional wall motion abnormalities (see scoring diagram/findings for description). The left ventricular internal cavity size was mildly dilated. There is moderate asymmetric left ventricular hypertrophy of the septal segment. There is the interventricular septum is flattened in systole and diastole, consistent with right ventricular pressure and volume overload. No evidence of LV thrombus on contrast images.  2. Right ventricular systolic function is normal. The right ventricular size is normal. Tricuspid regurgitation signal is inadequate for assessing PA pressure.  3. The inferior vena cava is dilated in size with <50% respiratory variability, suggesting right atrial pressure of 15 mmHg. Comparison(s): Changes from prior study are noted. LVEF improved from 20-25% to 30-35%. FINDINGS  Left Ventricle: Left ventricular  ejection fraction, by estimation, is 30 to 35%. The left ventricle has moderately decreased function. The left ventricle demonstrates regional wall motion abnormalities. Definity contrast agent was given IV to delineate the left ventricular endocardial borders. The left ventricular internal cavity size was mildly dilated. There is moderate asymmetric left ventricular hypertrophy of the septal segment. The interventricular septum is flattened in systole and diastole, consistent with right ventricular pressure and volume overload.  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 The entire anterior septum, apical lateral segment, mid inferoseptal segment, apical anterior segment, basal inferior segment, and apical inferior segment are akinetic. The anterior wall, basal anterolateral segment, mid inferior segment, and basal inferoseptal segment are hypokinetic. The posterior wall and mid anterolateral segment are normal. Right Ventricle: The right ventricular size is normal. No increase in  right ventricular wall thickness. Right ventricular systolic function is normal. Tricuspid regurgitation signal is inadequate for assessing PA pressure. Left Atrium: Left atrial size was not assessed. Right Atrium: Right atrial size was not assessed. Pericardium: There is no evidence of pericardial effusion. Mitral Valve: The mitral valve is normal in structure. Tricuspid Valve: The tricuspid valve is normal in structure. Aortic Valve: The aortic valve is normal in structure. Pulmonic Valve: The pulmonic valve was not well visualized. Aorta: The aortic root is normal in size and structure. Venous: The inferior vena cava is dilated in size with less than 50% respiratory variability, suggesting right atrial pressure of 15 mmHg. LEFT VENTRICLE PLAX 2D LVIDd:         5.85 cm LVIDs:         4.35 cm LV PW:         1.00 cm LV IVS:        1.30 cm LVOT diam:     2.00 cm LVOT Area:     3.14 cm  LV Volumes (MOD) LV vol d, MOD A2C: 149.0 ml LV vol d, MOD A4C: 125.0 ml LV vol s, MOD A2C: 104.0 ml LV vol s, MOD A4C: 89.9 ml LV SV MOD A2C:     45.0 ml LV SV MOD A4C:     125.0 ml LV SV MOD BP:      40.2 ml LEFT ATRIUM         Index LA diam:    4.60 cm 2.22 cm/m   AORTA Ao Root diam: 3.70 cm  SHUNTS Systemic Diam: 2.00 cm Vishnu Priya Mallipeddi Electronically signed by Winfield Rast Mallipeddi Signature Date/Time: 12/22/2022/5:16:42 PM    Final    [place] EKG SITE RITE Result Date: 12/21/2022 If Site Rite image not attached, placement could not be confirmed due to current cardiac rhythm.  CT CHEST ABDOMEN PELVIS WO CONTRAST Result Date: 12/19/2022 CLINICAL DATA:  Fall, shortness of breath, buttock abscesses EXAM: CT CHEST, ABDOMEN AND PELVIS WITHOUT CONTRAST TECHNIQUE: Multidetector CT imaging of the chest, abdomen and pelvis was performed following the standard protocol without IV contrast. RADIATION DOSE REDUCTION: This exam was performed according to the departmental dose-optimization program which includes automated exposure  control, adjustment of the mA and/or kV according to patient size and/or use of iterative reconstruction technique. COMPARISON:  CTA abdomen/pelvis dated 11/02/2022 FINDINGS: CT CHEST FINDINGS Cardiovascular: Cardiomegaly. No pericardial effusion. Hypodense blood pool relative to myocardium, suggesting anemia. No evidence of thoracic aortic aneurysm. Atherosclerotic calcifications of the arch. Severe three-vessel coronary atherosclerosis. Mediastinum/Nodes: Small mediastinal nodes, measuring up to 12 mm short axis in the right paratracheal region (series 2/image 14), previously 17 mm, likely reactive. Visualized thyroid  is unremarkable. Lungs/Pleura: Mosaic attenuation with mild scarring/atelectasis in the lungs bilaterally. No focal consolidation. No suspicious pulmonary nodules. No pleural effusion or pneumothorax. Musculoskeletal: Degenerative changes of the thoracic spine. Sternum, clavicles, and scapulae are intact. Bilateral ribs are intact. CT ABDOMEN PELVIS FINDINGS Hepatobiliary: Unenhanced liver is unremarkable. Layering tiny gallstones (series 2/image 58), without associated inflammatory changes. No intrahepatic or extrahepatic ductal dilatation. Pancreas: Within normal limits. Spleen: Within normal limits. Adrenals/Urinary Tract: Adrenal glands are within normal limits. Kidneys are within normal limits.  No hydronephrosis. Bladder is within normal limits. Stomach/Bowel: Stomach is within normal limits. No evidence of bowel obstruction. Normal appendix/appendiceal stump (series 2/image 84). No colonic wall thickening or inflammatory changes. Vascular/Lymphatic: No evidence of abdominal aortic aneurysm. Atherosclerotic calcifications of the abdominal aorta and branch vessels. Left external iliac stent. No suspicious abdominopelvic lymphadenopathy. Reproductive: Prostate is unremarkable. Other: No abdominopelvic ascites. Musculoskeletal: 3.1 x 2.8 cm fluid collection with trace nondependent gas along the  left medial gluteal cleft (series 2/image 122). 6.5 x 3.4 cm fluid collection inferiorly along the right medial gluteal cleft (series 2/image 135). These likely correspond to the patient's known buttock abscesses. Mild degenerative changes of the lumbar spine.  No fracture is seen. IMPRESSION: No traumatic injury to the chest, abdomen, or pelvis. Bilateral buttock abscesses, right greater than left. Additional ancillary findings as above. Electronically Signed   By: Charline Bills M.D.   On: 12/19/2022 00:19   DG Chest Port 1 View Result Date: 12/19/2022 CLINICAL DATA:  Questionable sepsis - evaluate for abnormality EXAM: PORTABLE CHEST 1 VIEW COMPARISON:  11/28/2022 FINDINGS: Cardiomegaly. Vascular congestion. Low lung volumes with bibasilar atelectasis. No overt edema or effusions. No acute bony abnormality. IMPRESSION: Cardiomegaly, vascular congestion. Low lung volumes with bibasilar atelectasis. Electronically Signed   By: Charlett Nose M.D.   On: 12/19/2022 00:19   CT Cervical Spine Wo Contrast Result Date: 12/19/2022 CLINICAL DATA:  Fall.  Neck trauma (Age >= 65y) EXAM: CT CERVICAL SPINE WITHOUT CONTRAST TECHNIQUE: Multidetector CT imaging of the cervical spine was performed without intravenous contrast. Multiplanar CT image reconstructions were also generated. RADIATION DOSE REDUCTION: This exam was performed according to the departmental dose-optimization program which includes automated exposure control, adjustment of the mA and/or kV according to patient size and/or use of iterative reconstruction technique. COMPARISON:  11/07/2021 FINDINGS: Alignment: Normal Skull base and vertebrae: No acute fracture. No primary bone lesion or focal pathologic process. Soft tissues and spinal canal: No prevertebral fluid or swelling. No visible canal hematoma. Disc levels: Mild diffuse degenerative disc disease and facet disease. Upper chest: No acute findings Other: None IMPRESSION: No acute bony abnormality.  Electronically Signed   By: Charlett Nose M.D.   On: 12/19/2022 00:14   CT HEAD WO CONTRAST ( ) Result Date: 12/19/2022 CLINICAL DATA:  Fall.  Head trauma, minor (Age >= 65y) EXAM: CT HEAD WITHOUT CONTRAST TECHNIQUE: Contiguous axial images were obtained from the base of the skull through the vertex without intravenous contrast. RADIATION DOSE REDUCTION: This exam was performed according to the departmental dose-optimization program which includes automated exposure control, adjustment of the mA and/or kV according to patient size and/or use of iterative reconstruction technique. COMPARISON:  11/28/2022, MRI 11/06/2022 FINDINGS: Brain: There is atrophy and chronic small vessel disease changes. Low-density/edema again noted in the right temporal lobe, unchanged. 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 No acute infarct, hemorrhage or hydrocephalus. Vascular: No hyperdense vessel or unexpected calcification. Skull: No acute calvarial abnormality.  Sinuses/Orbits: No acute findings Other: None IMPRESSION: Stable edema within the right temporal lobe, possibly related to known right middle cranial fossa meningioma, not seen on this study. Old infarcts as above. Atrophy, chronic microvascular disease. No acute intracranial abnormality. Electronically Signed   By: Charlett Nose M.D.   On: 12/19/2022 00:13   CT Head Wo Contrast Result Date: 11/28/2022 CLINICAL DATA:  Mental status change, alcohol/drug use EXAM: CT HEAD WITHOUT CONTRAST TECHNIQUE: Contiguous axial images were obtained from the base of the skull through the vertex without intravenous contrast. RADIATION DOSE REDUCTION: This exam was performed according to the departmental dose-optimization program which includes automated exposure control, adjustment of the mA and/or kV according to patient size and/or use of iterative reconstruction technique. COMPARISON:  Head CT 06/30/2022, brain MR 11/06/22 FINDINGS: Brain: Persistent  nonspecific edema in the right temporal lobe. Known meningioma in the right middle cranial fossa is not visualized on this exam due to CT technique. No hemorrhage. No hydrocephalus. No extra-axial fluid collection. No CT evidence of cortical infarct. Unchanged chronic infarcts in the right occipital lobe and left thalamus Vascular: No hyperdense vessel or unexpected calcification. Skull: Normal. Negative for fracture or focal lesion. Sinuses/Orbits: No middle ear or mastoid effusion. Paranasal sinuses are clear. Orbits are unremarkable. Other: None IMPRESSION: 1. Persistent nonspecific edema in the right temporal lobe. No hemorrhage. 2. Known meningioma in the right middle cranial fossa is not visualized on this exam due to CT technique. 3. Unchanged chronic infarcts in the right occipital lobe and left thalamus. Electronically Signed   By: Lorenza Cambridge M.D.   On: 11/28/2022 19:53   DG Chest 2 View Result Date: 11/28/2022 CLINICAL DATA:  Shortness of breath. EXAM: CHEST - 2 VIEW COMPARISON:  Chest radiograph dated October 18, 2022. FINDINGS: The heart size is enlarged. The mediastinal contours are within normal limits. Bilateral interstitial prominence, most pronounced at the lung bases. No focal consolidation. No pleural effusion or pneumothorax. No acute osseous abnormality. IMPRESSION: Cardiomegaly with findings suggestive of pulmonary vascular congestion. Electronically Signed   By: Hart Robinsons M.D.   On: 11/28/2022 19:22    Catarina Hartshorn, DO  [facility]  If 7PM-7AM, please contact night-coverage [email or web address] Password [email or web address] 12/23/2022, 4:49 PM   LOS: 4 days

## 2022-12-24 DIAGNOSIS — A419 Sepsis, unspecified organism: Secondary | ICD-10-CM | POA: Diagnosis not present

## 2022-12-24 DIAGNOSIS — N1832 Chronic kidney disease, stage 3b: Secondary | ICD-10-CM | POA: Diagnosis not present

## 2022-12-24 DIAGNOSIS — E1149 Type 2 diabetes mellitus with other diabetic neurological complication: Secondary | ICD-10-CM | POA: Diagnosis not present

## 2022-12-24 DIAGNOSIS — K610 Anal abscess: Secondary | ICD-10-CM | POA: Diagnosis not present

## 2022-12-24 LAB — CBC
HCT: 30 % — ABNORMAL LOW (ref 39.0–52.0)
Hemoglobin: 8.2 g/dL — ABNORMAL LOW (ref 13.0–17.0)
MCH: 21.8 pg — ABNORMAL LOW (ref 26.0–34.0)
MCHC: 27.3 g/dL — ABNORMAL LOW (ref 30.0–36.0)
MCV: 79.8 fL — ABNORMAL LOW (ref 80.0–100.0)
Platelets: 338 10*3/uL (ref 150–400)
RBC: 3.76 MIL/uL — ABNORMAL LOW (ref 4.22–5.81)
RDW: 23.7 % — ABNORMAL HIGH (ref 11.5–15.5)
WBC: 18.3 10*3/uL — ABNORMAL HIGH (ref 4.0–10.5)
nRBC: 0.3 % — ABNORMAL HIGH (ref 0.0–0.2)

## 2022-12-24 LAB — BASIC METABOLIC PANEL
Anion gap: 8 (ref 5–15)
BUN: 22 mg/dL (ref 8–23)
CO2: 18 mmol/L — ABNORMAL LOW (ref 22–32)
Calcium: 8.4 mg/dL — ABNORMAL LOW (ref 8.9–10.3)
Chloride: 106 mmol/L (ref 98–111)
Creatinine, Ser: 1.37 mg/dL — ABNORMAL HIGH (ref 0.61–1.24)
GFR, Estimated: 56 mL/min — ABNORMAL LOW (ref >60)
Glucose, Bld: 289 mg/dL — ABNORMAL HIGH (ref 70–99)
Potassium: 4.9 mmol/L (ref 3.5–5.1)
Sodium: 132 mmol/L — ABNORMAL LOW (ref 135–145)

## 2022-12-24 LAB — MAGNESIUM: Magnesium: 2.1 mg/dL (ref 1.7–2.4)

## 2022-12-24 MED ORDER — MIDODRINE HCL 10 MG PO TABS: 10.0000 mg | ORAL_TABLET | Freq: Three times a day (TID) | ORAL | 1 refills | Status: DC

## 2022-12-24 MED ORDER — AMOXICILLIN-POT CLAVULANATE 875-125 MG PO TABS: 1.0000 | ORAL_TABLET | Freq: Two times a day (BID) | ORAL | 0 refills | Status: DC

## 2022-12-24 MED ORDER — AMOXICILLIN-POT CLAVULANATE 875-125 MG PO TABS
1.0000 | ORAL_TABLET | Freq: Two times a day (BID) | ORAL | Status: DC
  Administered 2022-12-24: 1 via ORAL
  Filled 2022-12-24: qty 1

## 2022-12-24 MED ORDER — DOXYCYCLINE HYCLATE 100 MG PO TABS
100.0000 mg | ORAL_TABLET | Freq: Two times a day (BID) | ORAL | Status: DC
  Administered 2022-12-24: 100 mg via ORAL
  Filled 2022-12-24: qty 1

## 2022-12-24 MED ORDER — LINACLOTIDE 145 MCG PO CAPS: 145.0000 ug | ORAL_CAPSULE | Freq: Every day | ORAL | 1 refills | Status: DC

## 2022-12-24 MED ORDER — DOXYCYCLINE HYCLATE 100 MG PO TABS: 100.0000 mg | ORAL_TABLET | Freq: Two times a day (BID) | ORAL | 0 refills | Status: DC

## 2022-12-24 NOTE — Progress Notes (Signed)
Pt refuses new IV placement MD made aware.

## 2022-12-24 NOTE — Plan of Care (Signed)

## 2022-12-24 NOTE — TOC Transition Note (Signed)
Transition of Care [facility]) - Discharge Note   Patient Details  Name: [name] MRN: [id number] Date of Birth: May 07, 1955  [facility] of [facility]) CM/SW Contact:  Villa Herb, LCSWA Phone Number: 12/24/2022, 11:58 AM   Clinical Narrative:    CSW updated pt is medically stable for D/C home today. CSW updated [place] with [facility], they will follow pt in the community. MD placed HH orders. TOC signing off.   Final next level of care: Home w Home Health Services Barriers to Discharge: Continued Medical Work up   Patient Goals and CMS Choice Patient states their goals for this hospitalization and ongoing recovery are:: return home CMS Medicare.gov Compare Post Acute Care list provided to:: Patient Choice offered to / list presented to : Patient      Discharge Placement                       Discharge Plan and Services Additional resources added to the After Visit Summary for   In-house Referral: Clinical Social Work   Post Acute Care Choice: Resumption of Svcs/PTA Provider                    HH Arranged: RN, PT [facility] Agency: [facility] Date HH Agency Contacted: 12/20/22 Time HH Agency Contacted: [phone] Representative spoke with at [facility] Agency: Adele Dan  Social Drivers of Health (SDOH) Interventions SDOH Screenings   Food Insecurity: No Food Insecurity (12/19/2022)  Housing: Low Risk  (12/22/2022)  Transportation Needs: No Transportation Needs (12/19/2022)  Utilities: Not At Risk (12/19/2022)  Alcohol Screen: Low Risk  (12/14/2022)  Depression (PHQ2-9): Low Risk  (12/14/2022)  Financial Resource Strain: Low Risk  (12/14/2022)  Physical Activity: Inactive (12/14/2022)  Social Connections: Moderately Integrated (12/14/2022)  Stress: Stress Concern Present (12/14/2022)  Tobacco Use: High Risk (12/18/2022)  Health Literacy: Adequate Health Literacy (12/14/2022)     Readmission Risk Interventions    12/19/2022   10:56 AM 11/04/2022   10:22  AM 10/21/2022    1:05 PM  Readmission Risk Prevention Plan  Transportation Screening Complete Complete Complete  HRI or Home Care Consult  Complete Complete  Social Work Consult for Recovery Care Planning/Counseling  Complete Complete  Palliative Care Screening  Not Applicable Not Applicable  Medication Review Oceanographer) Complete Complete Complete  HRI or Home Care Consult Complete    SW Recovery Care/Counseling Consult Complete    Palliative Care Screening Not Applicable    Skilled Nursing Facility Not Applicable

## 2022-12-24 NOTE — Progress Notes (Signed)
Discharged instructions reviewed with patient and daughter patient verbalized understanding. Awaiting ride to arrive.

## 2022-12-24 NOTE — Progress Notes (Signed)
[facility] Progress Note     Subjective: Patient seen and examined.  He is resting comfortably in bed.  He denies pain at his I&D sites.  He denies significant drainage.  Objective: Vital signs in last 24 hours: Temp:  [97.7 F (36.5 C)-98.3 F (36.8 C)] 98 F (36.7 C) (12/14 0630) Pulse Rate:  [78-86] 81 (12/14 0630) Resp:  [17-25] 20 (12/14 0630) BP: (99-125)/(52-72) 99/59 (12/14 0630) SpO2:  [99 %-100 %] 100 % (12/14 0630) Weight:  [91.8 kg] 91.8 kg (12/14 0630) Last BM Date : 12/23/22  Intake/Output from previous day: 12/13 0701 - 12/14 0700 In: 580.3 [P.O.:480; IV Piggyback:100.3] Out: -  Intake/Output this shift: Total I/O In: 240 [P.O.:240] Out: -   General appearance: alert, cooperative, and no distress Skin: I&D sites on both the right and left buttocks with mild surrounding induration, nontender to palpation, no purulent drainage  Lab Results:  Recent Labs    12/23/22 0433 12/24/22 0452  WBC 19.1* 18.3*  HGB 8.6* 8.2*  HCT 30.1* 30.0*  PLT 381 338   BMET Recent Labs    12/23/22 0433 12/24/22 0452  NA 132* 132*  K 4.8 4.9  CL 103 106  CO2 20* 18*  GLUCOSE 195* 289*  BUN 23 22  CREATININE 1.45* 1.37*  CALCIUM 8.1* 8.4*   PT/INR No results for input(s): "LABPROT", "INR" in the last 72 hours.  Studies/Results: CT PELVIS WO CONTRAST Result Date: 12/24/2022 CLINICAL DATA:  Follow-up gluteal abscess.  Leukocytosis.  Sepsis. EXAM: CT PELVIS WITHOUT CONTRAST TECHNIQUE: Multidetector CT imaging of the pelvis was performed following the standard protocol without intravenous contrast. RADIATION DOSE REDUCTION: This exam was performed according to the departmental dose-optimization program which includes automated exposure control, adjustment of the mA and/or kV according to patient size and/or use of iterative reconstruction technique. COMPARISON:  12/18/2022 FINDINGS: Lower Urinary Tract: Unremarkable urinary bladder. Bowel: Unremarkable  pelvic bowel loops. Vascular/Lymphatic: No pathologically enlarged lymph nodes or other significant abnormality. Reproductive:  No mass or other significant abnormality. Other: None. Musculoskeletal: No suspicious bone lesions identified. Previously seen fluid collection in the medial right buttock subcutaneous tissues adjacent to the gluteal crease has resolved. Persistent mild skin thickening and subcutaneous soft tissue stranding noted in this region. Previously seen smaller abscess posterior to the anal canal and along the gluteal crease has also resolved. IMPRESSION: Resolution of previously seen fluid collection in the medial right buttock subcutaneous tissues. Persistent mild skin thickening and subcutaneous soft tissue stranding in this region is consistent with cellulitis. Resolution of posterior perianal abscess. Electronically Signed   By: Danae Orleans M.D.   On: 12/24/2022 08:38   ECHOCARDIOGRAM LIMITED Result Date: 12/22/2022    ECHOCARDIOGRAM LIMITED REPORT   Patient Name:   TRAIVON JUNEAU Date of Exam: 12/22/2022 Medical Rec #:  [id number]        Height:       69.0 in Accession #:    [id number]       Weight:       201.7 lb Date of Birth:  1955/04/04        BSA:          2.074 m Patient Age:    67 years         BP:           108/81 mmHg Patient Gender: M                HR:  77 bpm. [facility] Procedure: Limited Echo and Intracardiac Opacification Agent Indications:    CHF- Acute Systolic l50.21  History:        Patient has prior history of Echocardiogram examinations, most                 recent 10/21/2022. CHF and Cardiomyopathy, CAD and Previous                 Myocardial Infarction, Stroke; Risk Factors:Hypertension,                 Diabetes, Dyslipidemia and Sleep Apnea.  Sonographer:    Celesta Gentile RCS Referring Phys: [phone] DAVID TAT IMPRESSIONS  1. Left ventricular ejection fraction, by estimation, is 30 to 35%. The left ventricle has moderately decreased function.  The left ventricle demonstrates regional wall motion abnormalities (see scoring diagram/findings for description). The left ventricular internal cavity size was mildly dilated. There is moderate asymmetric left ventricular hypertrophy of the septal segment. There is the interventricular septum is flattened in systole and diastole, consistent with right ventricular pressure and volume overload. No evidence of LV thrombus on contrast images.  2. Right ventricular systolic function is normal. The right ventricular size is normal. Tricuspid regurgitation signal is inadequate for assessing PA pressure.  3. The inferior vena cava is dilated in size with <50% respiratory variability, suggesting right atrial pressure of 15 mmHg. Comparison(s): Changes from prior study are noted. LVEF improved from 20-25% to 30-35%. FINDINGS  Left Ventricle: Left ventricular ejection fraction, by estimation, is 30 to 35%. The left ventricle has moderately decreased function. The left ventricle demonstrates regional wall motion abnormalities. Definity contrast agent was given IV to delineate the left ventricular endocardial borders. The left ventricular internal cavity size was mildly dilated. There is moderate asymmetric left ventricular hypertrophy of the septal segment. The interventricular septum is flattened in systole and diastole, consistent with right ventricular pressure and volume overload.  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 The entire anterior septum, apical lateral segment, mid inferoseptal segment, apical anterior segment, basal inferior segment, and apical inferior segment are akinetic. The anterior wall, basal anterolateral segment, mid inferior segment, and basal inferoseptal segment are hypokinetic. The posterior wall and mid anterolateral segment are normal. Right Ventricle: The right ventricular size is normal. No increase in right ventricular wall thickness. Right ventricular systolic function is normal. Tricuspid  regurgitation signal is inadequate for assessing PA pressure. Left Atrium: Left atrial size was not assessed. Right Atrium: Right atrial size was not assessed. Pericardium: There is no evidence of pericardial effusion. Mitral Valve: The mitral valve is normal in structure. Tricuspid Valve: The tricuspid valve is normal in structure. Aortic Valve: The aortic valve is normal in structure. Pulmonic Valve: The pulmonic valve was not well visualized. Aorta: The aortic root is normal in size and structure. Venous: The inferior vena cava is dilated in size with less than 50% respiratory variability, suggesting right atrial pressure of 15 mmHg. LEFT VENTRICLE PLAX 2D LVIDd:         5.85 cm LVIDs:         4.35 cm LV PW:         1.00 cm LV IVS:        1.30 cm LVOT diam:     2.00 cm LVOT Area:     3.14 cm  LV Volumes (MOD) LV vol d, MOD A2C: 149.0 ml LV vol d, MOD A4C: 125.0 ml LV vol s, MOD A2C:  104.0 ml LV vol s, MOD A4C: 89.9 ml LV SV MOD A2C:     45.0 ml LV SV MOD A4C:     125.0 ml LV SV MOD BP:      40.2 ml LEFT ATRIUM         Index LA diam:    4.60 cm 2.22 cm/m   AORTA Ao Root diam: 3.70 cm  SHUNTS Systemic Diam: 2.00 cm Vishnu Priya Mallipeddi Electronically signed by Winfield Rast Mallipeddi Signature Date/Time: 12/22/2022/5:16:42 PM    Final     Anti-infectives: Anti-infectives (From admission, onward)    Start     Dose/Rate Route Frequency Ordered Stop   12/24/22 1100  doxycycline (VIBRA-TABS) tablet 100 mg        100 mg Oral Every 12 hours 12/24/22 1002     12/24/22 1100  amoxicillin-clavulanate (AUGMENTIN) 875-125 MG per tablet 1 tablet        1 tablet Oral Every 12 hours 12/24/22 1002     12/24/22 0800  vancomycin (VANCOREADY) IVPB 1250 mg/250 mL  Status:  Discontinued        1,250 mg 166.7 mL/hr over 90 Minutes Intravenous Every 24 hours 12/23/22 1041 12/24/22 1002   12/24/22 0030  metroNIDAZOLE (FLAGYL) tablet 500 mg        500 mg Oral Once 12/23/22 2250 12/24/22 0130   12/24/22 0000   amoxicillin-clavulanate (AUGMENTIN) 875-125 MG tablet        1 tablet Oral Every 12 hours 12/24/22 1142     12/24/22 0000  doxycycline (VIBRA-TABS) 100 MG tablet        100 mg Oral Every 12 hours 12/24/22 1142     12/20/22 0800  vancomycin (VANCOCIN) IVPB 1000 mg/200 mL premix  Status:  Discontinued        1,000 mg 200 mL/hr over 60 Minutes Intravenous Every 24 hours 12/19/22 0914 12/23/22 1041   12/19/22 2200  cefTRIAXone (ROCEPHIN) 2 g in sodium chloride 0.9 % 100 mL IVPB  Status:  Discontinued        2 g 200 mL/hr over 30 Minutes Intravenous Every 24 hours 12/19/22 0854 12/24/22 1002   12/19/22 1230  metroNIDAZOLE (FLAGYL) IVPB 500 mg  Status:  Discontinued        500 mg 100 mL/hr over 60 Minutes Intravenous Every 12 hours 12/19/22 1141 12/24/22 1002   12/19/22 0400  vancomycin (VANCOCIN) IVPB 1000 mg/200 mL premix       Placed in "Followed by" Linked Group   1,000 mg 200 mL/hr over 60 Minutes Intravenous  Once 12/19/22 0221 12/19/22 1029   12/19/22 0300  vancomycin (VANCOCIN) IVPB 1000 mg/200 mL premix       Placed in "Followed by" Linked Group   1,000 mg 200 mL/hr over 60 Minutes Intravenous  Once 12/19/22 0221 12/19/22 1029   12/18/22 2345  cefTRIAXone (ROCEPHIN) 2 g in sodium chloride 0.9 % 100 mL IVPB        2 g 200 mL/hr over 30 Minutes Intravenous  Once 12/18/22 2334 12/19/22 0208       Assessment/Plan:  Patient is a 67 year old male noted to have right and left buttock abscesses.  -Both areas open without significant drainage -CT pelvis performed yesterday demonstrated resolution of previously seen fluid collection in the medial right buttock subcutaneous tissue with persistent mild skin thickening and subcutaneous soft tissue stranding consistent with cellulitis; resolution of posterior perianal abscess -Antibiotics per primary team -No further surgical interventions needed   LOS: 5 days  Keyron Pokorski A Cybil [place] 12/24/2022

## 2022-12-24 NOTE — Discharge Summary (Signed)
Physician Discharge Summary   Patient: [name] MRN: [id number] DOB: Jan 28, 1955  Admit date:     12/18/2022  Discharge date: 12/24/22  Discharge Physician: Onalee Hua Malyna Budney   PCP: Benetta Spar, MD   Recommendations at discharge:   Please follow up with primary care provider within 1-2 weeks  Please repeat BMP and CBC in one week      Hospital Course: 67 y.o. male with a history of CAD, tobacco use, HTN, HLD, T2DM, GERD, history of cocaine use, OSA, and combined CHF (LVEF 20-25%, G2DD), DM polyneuropathy, iron deficiency anemia who presented.  after a fall in the shower. Endorses having pain all over. Also complains of perianal pain.   In the ED, tachycardic with soft BPs, lab studies notable for leukocytosis 24.7K.  Perianal abscesses noted on exam and confirmed on noncontrast CT abdomen and pelvis.  Code sepsis was called in the ED.  The patient was started on empiric IV antibiotics, Rocephin and IV vancomycin.  Admitted by [facility], hospitalist service.   ED Course: Temperature 99.  BP 96/59, pulse 103, respiratory 17, saturation 99% on room air.  Labs today notable for WBC 24.7, hemoglobin 7.9, platelet count 610.  Creatinine 2.05 from baseline 1.76.  Notably, the patient was recently mated to the hospital from 11/02/2022 to 11/15/2022 for respiratory failure secondary to acute on chronic HFrEF.  During that hospitalization, the patient was also seen by GI for acute on chronic anemia.  He underwent EGD and colonoscopy on 11/11/2022.  EGD showed erythematous mucosa in the antrum and duodenitis.  Colonoscopy showed 3 polyps in the transverse colon, 1 in the ascending colon.  His iron deficiency was felt to be due to large polyps of 1 with visible oozing.  He was given Venofer and started on ferrous sulfate at time of discharge.  General surgery was consulted.  Bedside debridement was performed on the right perianal abscess.  Cultures were sent.  The patient was started vancomycin,  ceftriaxone, and metronidazole.  Left gluteal lesion was also incised but there was not abscess.  Assessment and Plan:  Severe Sepsis -Presented with tachycardia and leukocytosis and elevated lactic acid -Secondary to perirectal abscess -general surgery following -s/p bedside I&D on right abscess -12/19/22 CT AP--bilateral buttock abscess R>L -BPs remain soft but improving -increase midodrine to 10 mg TID -given one liter LR due to soft BP and elevated lactate on 12/12 -repeat limited 12/12 Echo EF 30-35% -sepsis physiology resolved   Perirectal abscess -general surgery following -s/p bedside I&D on right abscess -12/19/22 CT AP--bilateral buttock abscess R>L -12/19/22 culture--flora -continue vanc/ceftriaxone/metronidazole -repeat CT pelvis--abscesses resolved -d/c home with amox/clav and doxy x 7 more days   Bacteremia -represents contaminant -diphtheroids in 1/2 sets -staph epi in 1/2 sets   Chronic HFrEF -clinically euvolemic -unable to restart coreg due to soft BPs -10/21/22 Echo--EF 20-25%, G2DD, mild MR -transitioned to po torsemide  20 mg daily at time of last d/c -repeat limited 12/12 Echo EF 30-35% -resume torsemide home dose 20 mg daily   CKD 3b -baseline creatinine 1.5-1.9 -monitor serial BMP -now better than baseline   Meningioma -12/19/22 CT brain--stable edema right temporal lobe possibly related to known right middle cranial fossa meningioma   Iron Deficiency Anemia -baseline Hgb ~8 -11/08/22 iron saturation 8%, ferritin 13  -11/08/22 folate 7.4--continue folate supplementation -11/1 EGD--ertythematous mucosa in antrum, +duodenitis -11/1 colonoscopy--3 polyps in transverse colon, one in ascending colon;  iron deficiency due to large polyps with one with visible oozing -  transfused one unite PRBC 12/10 -repeat iron studies--iron saturation 12%, ferritin 23 -continue pantoprazole -no signs of active bleeding -transfuse addition unit PRBC 12/12 due to  patient's cardiomyopathy (2 units total for the admission)   Polysubstance abuse -12/9 UDS negative -Patient continues to smoke -Tobacco cessation discussed   Diabetes mellitus type 2 with hyperglycemia --10/21/22 HbA1c was 6.3%  --started novolog sliding scale   CAD -No chest pain. Later did have atypical chest pain likely musculoskeletal with persistently negative troponin and no ischemic ECG changes.  - Continue ASA, statin -BB on hold due to soft BPs   OSA:  - CPAP at bedtime   Peripheral neuropathy:  - Continue gabapentin   PAD: Chronic left common iliac occlusion on CTA with distal reconstution.  - Needs vascular surgery evaluation as outpatient.         Consultants: general surgery Procedures performed: none  Disposition: Home Diet recommendation:  Carb modified diet DISCHARGE MEDICATION: Allergies as of 12/24/2022       Reactions   Clopidogrel Other (See Comments)   Drowsy, Skin irritation        Medication List     STOP taking these medications    furosemide 40 MG tablet Commonly known as: LASIX   Jardiance 10 MG Tabs tablet Generic drug: empagliflozin   traMADol 50 MG tablet Commonly known as: ULTRAM       TAKE these medications    amoxicillin-clavulanate 875-125 MG tablet Commonly known as: AUGMENTIN Take 1 tablet by mouth every 12 (twelve) hours.   aspirin EC 81 MG tablet Commonly known as: Aspirin Low Dose Take 1 tablet (81 mg total) by mouth daily. Swallow whole.   atorvastatin 40 MG tablet Commonly known as: LIPITOR Take 1 tablet (40 mg total) by mouth at bedtime.   carvedilol 3.125 MG tablet Commonly known as: COREG Take 1 tablet (3.125 mg total) by mouth 2 (two) times daily with a meal.   doxycycline 100 MG tablet Commonly known as: VIBRA-TABS Take 1 tablet (100 mg total) by mouth every 12 (twelve) hours.   ferrous sulfate 325 (65 FE) MG tablet Take 1 tablet (325 mg total) by mouth daily with breakfast.   folic  acid 1 MG tablet Commonly known as: FOLVITE Take 1 tablet (1 mg total) by mouth daily.   gabapentin 300 MG capsule Commonly known as: NEURONTIN Take 1 capsule (300 mg total) by mouth 3 (three) times daily.   insulin glargine 100 UNIT/ML Solostar Pen Commonly known as: LANTUS Inject 5 Units into the skin daily. What changed: when to take this   Insulin Pen Needle 31G X 5 MM Misc Use with insulin pen to dispense insulin as directed   linaclotide 145 MCG Caps capsule Commonly known as: LINZESS Take 1 capsule (145 mcg total) by mouth daily before breakfast.   midodrine 10 MG tablet Commonly known as: PROAMATINE Take 1 tablet (10 mg total) by mouth 3 (three) times daily with meals.   pantoprazole 40 MG tablet Commonly known as: PROTONIX Take 1 tablet (40 mg total) by mouth daily.   torsemide 20 MG tablet Commonly known as: DEMADEX Take 1 tablet (20 mg total) by mouth daily.        Follow-up Information     [place], [facility] Follow up.   Why: Will contact you to schedule home health visits. Contact information: [address] [place] [address] [phone]  Discharge Exam: Filed Weights   12/22/22 1210 12/23/22 0640 12/24/22 0630  Weight: 91.5 kg 92 kg 91.8 kg   HEENT:  [place]/AT, No thrush, no icterus CV:  RRR, no rub, no S3, no S4 Lung:  CTA, no wheeze, no rhonchi Abd:  soft/+BS, NT Ext:  No edema, no lymphangitis, no synovitis, no rash  Condition at discharge: stable  The results of significant diagnostics from this hospitalization (including imaging, microbiology, ancillary and laboratory) are listed below for reference.   Imaging Studies: CT PELVIS WO CONTRAST Result Date: 12/24/2022 CLINICAL DATA:  Follow-up gluteal abscess.  Leukocytosis.  Sepsis. EXAM: CT PELVIS WITHOUT CONTRAST TECHNIQUE: Multidetector CT imaging of the pelvis was performed following the standard protocol without intravenous contrast.  RADIATION DOSE REDUCTION: This exam was performed according to the departmental dose-optimization program which includes automated exposure control, adjustment of the mA and/or kV according to patient size and/or use of iterative reconstruction technique. COMPARISON:  12/18/2022 FINDINGS: Lower Urinary Tract: Unremarkable urinary bladder. Bowel: Unremarkable pelvic bowel loops. Vascular/Lymphatic: No pathologically enlarged lymph nodes or other significant abnormality. Reproductive:  No mass or other significant abnormality. Other: None. Musculoskeletal: No suspicious bone lesions identified. Previously seen fluid collection in the medial right buttock subcutaneous tissues adjacent to the gluteal crease has resolved. Persistent mild skin thickening and subcutaneous soft tissue stranding noted in this region. Previously seen smaller abscess posterior to the anal canal and along the gluteal crease has also resolved. IMPRESSION: Resolution of previously seen fluid collection in the medial right buttock subcutaneous tissues. Persistent mild skin thickening and subcutaneous soft tissue stranding in this region is consistent with cellulitis. Resolution of posterior perianal abscess. Electronically Signed   By: Danae Orleans M.D.   On: 12/24/2022 08:38   ECHOCARDIOGRAM LIMITED Result Date: 12/22/2022    ECHOCARDIOGRAM LIMITED REPORT   Patient Name:   PAOLO DUBON Date of Exam: 12/22/2022 Medical Rec #:  [id number]        Height:       69.0 in Accession #:    [id number]       Weight:       201.7 lb Date of Birth:  [birth year]        BSA:          2.074 m Patient Age:    67 years         BP:           108/81 mmHg Patient Gender: M                HR:           77 bpm. [facility] Procedure: Limited Echo and Intracardiac Opacification Agent Indications:    CHF- Acute Systolic l50.21  History:        Patient has prior history of Echocardiogram examinations, most                 recent 10/21/2022. CHF and  Cardiomyopathy, CAD and Previous                 Myocardial Infarction, Stroke; Risk Factors:Hypertension,                 Diabetes, Dyslipidemia and Sleep Apnea.  Sonographer:    Celesta Gentile RCS Referring Phys: [phone] Bonney Berres IMPRESSIONS  1. Left ventricular ejection fraction, by estimation, is 30 to 35%. The left ventricle has moderately decreased function. The left ventricle demonstrates regional wall motion abnormalities (see scoring diagram/findings for description).  The left ventricular internal cavity size was mildly dilated. There is moderate asymmetric left ventricular hypertrophy of the septal segment. There is the interventricular septum is flattened in systole and diastole, consistent with right ventricular pressure and volume overload. No evidence of LV thrombus on contrast images.  2. Right ventricular systolic function is normal. The right ventricular size is normal. Tricuspid regurgitation signal is inadequate for assessing PA pressure.  3. The inferior vena cava is dilated in size with <50% respiratory variability, suggesting right atrial pressure of 15 mmHg. Comparison(s): Changes from prior study are noted. LVEF improved from 20-25% to 30-35%. FINDINGS  Left Ventricle: Left ventricular ejection fraction, by estimation, is 30 to 35%. The left ventricle has moderately decreased function. The left ventricle demonstrates regional wall motion abnormalities. Definity contrast agent was given IV to delineate the left ventricular endocardial borders. The left ventricular internal cavity size was mildly dilated. There is moderate asymmetric left ventricular hypertrophy of the septal segment. The interventricular septum is flattened in systole and diastole, consistent with right ventricular pressure and volume overload.  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 The entire anterior septum, apical lateral segment, mid inferoseptal segment, apical anterior segment, basal inferior segment, and apical inferior  segment are akinetic. The anterior wall, basal anterolateral segment, mid inferior segment, and basal inferoseptal segment are hypokinetic. The posterior wall and mid anterolateral segment are normal. Right Ventricle: The right ventricular size is normal. No increase in right ventricular wall thickness. Right ventricular systolic function is normal. Tricuspid regurgitation signal is inadequate for assessing PA pressure. Left Atrium: Left atrial size was not assessed. Right Atrium: Right atrial size was not assessed. Pericardium: There is no evidence of pericardial effusion. Mitral Valve: The mitral valve is normal in structure. Tricuspid Valve: The tricuspid valve is normal in structure. Aortic Valve: The aortic valve is normal in structure. Pulmonic Valve: The pulmonic valve was not well visualized. Aorta: The aortic root is normal in size and structure. Venous: The inferior vena cava is dilated in size with less than 50% respiratory variability, suggesting right atrial pressure of 15 mmHg. LEFT VENTRICLE PLAX 2D LVIDd:         5.85 cm LVIDs:         4.35 cm LV PW:         1.00 cm LV IVS:        1.30 cm LVOT diam:     2.00 cm LVOT Area:     3.14 cm  LV Volumes (MOD) LV vol d, MOD A2C: 149.0 ml LV vol d, MOD A4C: 125.0 ml LV vol s, MOD A2C: 104.0 ml LV vol s, MOD A4C: 89.9 ml LV SV MOD A2C:     45.0 ml LV SV MOD A4C:     125.0 ml LV SV MOD BP:      40.2 ml LEFT ATRIUM         Index LA diam:    4.60 cm 2.22 cm/m   AORTA Ao Root diam: 3.70 cm  SHUNTS Systemic Diam: 2.00 cm Vishnu Priya Mallipeddi Electronically signed by Winfield Rast Mallipeddi Signature Date/Time: 12/22/2022/5:16:42 PM    Final    [place] EKG SITE RITE Result Date: 12/21/2022 If Site Rite image not attached, placement could not be confirmed due to current cardiac rhythm.  CT CHEST ABDOMEN PELVIS WO CONTRAST Result Date: 12/19/2022 CLINICAL DATA:  Fall, shortness of breath, buttock abscesses EXAM: CT CHEST, ABDOMEN AND PELVIS WITHOUT CONTRAST  TECHNIQUE: Multidetector CT imaging of the chest,  abdomen and pelvis was performed following the standard protocol without IV contrast. RADIATION DOSE REDUCTION: This exam was performed according to the departmental dose-optimization program which includes automated exposure control, adjustment of the mA and/or kV according to patient size and/or use of iterative reconstruction technique. COMPARISON:  CTA abdomen/pelvis dated 11/02/2022 FINDINGS: CT CHEST FINDINGS Cardiovascular: Cardiomegaly. No pericardial effusion. Hypodense blood pool relative to myocardium, suggesting anemia. No evidence of thoracic aortic aneurysm. Atherosclerotic calcifications of the arch. Severe three-vessel coronary atherosclerosis. Mediastinum/Nodes: Small mediastinal nodes, measuring up to 12 mm short axis in the right paratracheal region (series 2/image 14), previously 17 mm, likely reactive. Visualized thyroid is unremarkable. Lungs/Pleura: Mosaic attenuation with mild scarring/atelectasis in the lungs bilaterally. No focal consolidation. No suspicious pulmonary nodules. No pleural effusion or pneumothorax. Musculoskeletal: Degenerative changes of the thoracic spine. Sternum, clavicles, and scapulae are intact. Bilateral ribs are intact. CT ABDOMEN PELVIS FINDINGS Hepatobiliary: Unenhanced liver is unremarkable. Layering tiny gallstones (series 2/image 58), without associated inflammatory changes. No intrahepatic or extrahepatic ductal dilatation. Pancreas: Within normal limits. Spleen: Within normal limits. Adrenals/Urinary Tract: Adrenal glands are within normal limits. Kidneys are within normal limits.  No hydronephrosis. Bladder is within normal limits. Stomach/Bowel: Stomach is within normal limits. No evidence of bowel obstruction. Normal appendix/appendiceal stump (series 2/image 84). No colonic wall thickening or inflammatory changes. Vascular/Lymphatic: No evidence of abdominal aortic aneurysm. Atherosclerotic calcifications  of the abdominal aorta and branch vessels. Left external iliac stent. No suspicious abdominopelvic lymphadenopathy. Reproductive: Prostate is unremarkable. Other: No abdominopelvic ascites. Musculoskeletal: 3.1 x 2.8 cm fluid collection with trace nondependent gas along the left medial gluteal cleft (series 2/image 122). 6.5 x 3.4 cm fluid collection inferiorly along the right medial gluteal cleft (series 2/image 135). These likely correspond to the patient's known buttock abscesses. Mild degenerative changes of the lumbar spine.  No fracture is seen. IMPRESSION: No traumatic injury to the chest, abdomen, or pelvis. Bilateral buttock abscesses, right greater than left. Additional ancillary findings as above. Electronically Signed   By: Charline Bills M.D.   On: 12/19/2022 00:19   DG Chest Port 1 View Result Date: 12/19/2022 CLINICAL DATA:  Questionable sepsis - evaluate for abnormality EXAM: PORTABLE CHEST 1 VIEW COMPARISON:  11/28/2022 FINDINGS: Cardiomegaly. Vascular congestion. Low lung volumes with bibasilar atelectasis. No overt edema or effusions. No acute bony abnormality. IMPRESSION: Cardiomegaly, vascular congestion. Low lung volumes with bibasilar atelectasis. Electronically Signed   By: Charlett Nose M.D.   On: 12/19/2022 00:19   CT Cervical Spine Wo Contrast Result Date: 12/19/2022 CLINICAL DATA:  Fall.  Neck trauma (Age >= 65y) EXAM: CT CERVICAL SPINE WITHOUT CONTRAST TECHNIQUE: Multidetector CT imaging of the cervical spine was performed without intravenous contrast. Multiplanar CT image reconstructions were also generated. RADIATION DOSE REDUCTION: This exam was performed according to the departmental dose-optimization program which includes automated exposure control, adjustment of the mA and/or kV according to patient size and/or use of iterative reconstruction technique. COMPARISON:  11/07/2021 FINDINGS: Alignment: Normal Skull base and vertebrae: No acute fracture. No primary bone lesion  or focal pathologic process. Soft tissues and spinal canal: No prevertebral fluid or swelling. No visible canal hematoma. Disc levels: Mild diffuse degenerative disc disease and facet disease. Upper chest: No acute findings Other: None IMPRESSION: No acute bony abnormality. Electronically Signed   By: Charlett Nose M.D.   On: 12/19/2022 00:14   CT HEAD WO CONTRAST ( ) Result Date: 12/19/2022 CLINICAL DATA:  Fall.  Head trauma, minor (Age >= 65y) EXAM: CT  HEAD WITHOUT CONTRAST TECHNIQUE: Contiguous axial images were obtained from the base of the skull through the vertex without intravenous contrast. RADIATION DOSE REDUCTION: This exam was performed according to the departmental dose-optimization program which includes automated exposure control, adjustment of the mA and/or kV according to patient size and/or use of iterative reconstruction technique. COMPARISON:  11/28/2022, MRI 11/06/2022 FINDINGS: Brain: There is atrophy and chronic small vessel disease changes. Low-density/edema again noted in the right temporal lobe, unchanged. 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 No acute infarct, hemorrhage or hydrocephalus. Vascular: No hyperdense vessel or unexpected calcification. Skull: No acute calvarial abnormality. Sinuses/Orbits: No acute findings Other: None IMPRESSION: Stable edema within the right temporal lobe, possibly related to known right middle cranial fossa meningioma, not seen on this study. Old infarcts as above. Atrophy, chronic microvascular disease. No acute intracranial abnormality. Electronically Signed   By: Charlett Nose M.D.   On: 12/19/2022 00:13   CT Head Wo Contrast Result Date: 11/28/2022 CLINICAL DATA:  Mental status change, alcohol/drug use EXAM: CT HEAD WITHOUT CONTRAST TECHNIQUE: Contiguous axial images were obtained from the base of the skull through the vertex without intravenous contrast. RADIATION DOSE REDUCTION: This exam was performed according  to the departmental dose-optimization program which includes automated exposure control, adjustment of the mA and/or kV according to patient size and/or use of iterative reconstruction technique. COMPARISON:  Head CT 06/30/2022, brain MR 11/06/22 FINDINGS: Brain: Persistent nonspecific edema in the right temporal lobe. Known meningioma in the right middle cranial fossa is not visualized on this exam due to CT technique. No hemorrhage. No hydrocephalus. No extra-axial fluid collection. No CT evidence of cortical infarct. Unchanged chronic infarcts in the right occipital lobe and left thalamus Vascular: No hyperdense vessel or unexpected calcification. Skull: Normal. Negative for fracture or focal lesion. Sinuses/Orbits: No middle ear or mastoid effusion. Paranasal sinuses are clear. Orbits are unremarkable. Other: None IMPRESSION: 1. Persistent nonspecific edema in the right temporal lobe. No hemorrhage. 2. Known meningioma in the right middle cranial fossa is not visualized on this exam due to CT technique. 3. Unchanged chronic infarcts in the right occipital lobe and left thalamus. Electronically Signed   By: Lorenza Cambridge M.D.   On: 11/28/2022 19:53   DG Chest 2 View Result Date: 11/28/2022 CLINICAL DATA:  Shortness of breath. EXAM: CHEST - 2 VIEW COMPARISON:  Chest radiograph dated October 18, 2022. FINDINGS: The heart size is enlarged. The mediastinal contours are within normal limits. Bilateral interstitial prominence, most pronounced at the lung bases. No focal consolidation. No pleural effusion or pneumothorax. No acute osseous abnormality. IMPRESSION: Cardiomegaly with findings suggestive of pulmonary vascular congestion. Electronically Signed   By: Hart Robinsons M.D.   On: 11/28/2022 19:22    Microbiology: Results for orders placed or performed during the hospital encounter of 12/18/22  Resp panel by RT-PCR (RSV, Flu A&B, Covid) Anterior Nasal Swab     Status: None   Collection Time: 12/18/22  11:44 PM   Specimen: Anterior Nasal Swab  Result Value Ref Range Status   SARS Coronavirus 2 by RT PCR NEGATIVE NEGATIVE Final    Comment: (NOTE) SARS-CoV-2 target nucleic acids are NOT DETECTED.  The SARS-CoV-2 RNA is generally detectable in upper respiratory specimens during the acute phase of infection. The lowest concentration of SARS-CoV-2 viral copies this assay can detect is 138 copies/mL. A negative result does not preclude SARS-Cov-2 infection and should not be used as the sole basis for treatment  or other patient management decisions. A negative result may occur with  improper specimen collection/handling, submission of specimen other than nasopharyngeal swab, presence of viral mutation(s) within the areas targeted by this assay, and inadequate number of viral copies(<138 copies/mL). A negative result must be combined with clinical observations, patient history, and epidemiological information. The expected result is Negative.  Fact Sheet for Patients:  BloggerCourse.com  Fact Sheet for Healthcare Providers:  SeriousBroker.it  This test is no t yet approved or cleared by the [place] FDA and  has been authorized for detection and/or diagnosis of SARS-CoV-2 by FDA under an Emergency Use Authorization (EUA). This EUA will remain  in effect (meaning this test can be used) for the duration of the COVID-19 declaration under Section 564(b)(1) of the Act, 21 U.S.C.section 360bbb-3(b)(1), unless the authorization is terminated  or revoked sooner.       Influenza A by PCR NEGATIVE NEGATIVE Final   Influenza B by PCR NEGATIVE NEGATIVE Final    Comment: (NOTE) The Xpert Xpress SARS-CoV-2/FLU/RSV plus assay is intended as an aid in the diagnosis of influenza from Nasopharyngeal swab specimens and should not be used as a sole basis for treatment. Nasal washings and aspirates are unacceptable for Xpert Xpress  SARS-CoV-2/FLU/RSV testing.  Fact Sheet for Patients: BloggerCourse.com  Fact Sheet for Healthcare Providers: SeriousBroker.it  This test is not yet approved or cleared by the [place] FDA and has been authorized for detection and/or diagnosis of SARS-CoV-2 by FDA under an Emergency Use Authorization (EUA). This EUA will remain in effect (meaning this test can be used) for the duration of the COVID-19 declaration under Section 564(b)(1) of the Act, 21 U.S.C. section 360bbb-3(b)(1), unless the authorization is terminated or revoked.     Resp Syncytial Virus by PCR NEGATIVE NEGATIVE Final    Comment: (NOTE) Fact Sheet for Patients: BloggerCourse.com  Fact Sheet for Healthcare Providers: SeriousBroker.it  This test is not yet approved or cleared by the [place] FDA and has been authorized for detection and/or diagnosis of SARS-CoV-2 by FDA under an Emergency Use Authorization (EUA). This EUA will remain in effect (meaning this test can be used) for the duration of the COVID-19 declaration under Section 564(b)(1) of the Act, 21 U.S.C. section 360bbb-3(b)(1), unless the authorization is terminated or revoked.  Performed at [name], [address], [place], [place] [address]   Blood Culture (routine x 2)     Status: Abnormal   Collection Time: 12/18/22 11:44 PM   Specimen: Right Antecubital; Blood  Result Value Ref Range Status   Specimen Description   Final    RIGHT ANTECUBITAL Performed at [facility], [address], [place], [place] [address]    Special Requests   Final    BOTTLES DRAWN AEROBIC AND ANAEROBIC Blood Culture adequate volume Performed at [facility], [address], [place], [place] [address]    Culture  Setup Time   Final    GRAM POSITIVE RODS AEROBIC BOTTLE ONLY CRITICAL RESULT CALLED TO, READ BACK BY AND VERIFIED WITH: ANGEL Ray at 1705  12/20/2022 by T KENNEDY PREVIOUSLY REPORTED AS: GRAM POSITIVE COCCI CORRECTED RESULTS CALLED TO: RN Ethelene Hal [phone] @ 2247 FH    Culture (A)  Final    CORYNEBACTERIUM AMYCOLATUM Standardized susceptibility testing for this organism is not available. Performed at [facility], [address], [place], [place] [address]    Report Status 12/23/2022 FINAL  Final  Blood Culture (routine x 2)  Status: Abnormal   Collection Time: 12/19/22 12:15 AM   Specimen: BLOOD LEFT HAND  Result Value Ref Range Status   Specimen Description   Final    BLOOD LEFT HAND Performed at [facility], [address], [place], [place] [address]    Special Requests   Final    BOTTLES DRAWN AEROBIC AND ANAEROBIC Blood Culture adequate volume Performed at [facility], [address], [place], [place] [address]    Culture  Setup Time   Final    GRAM POSITIVE COCCI AEROBIC BOTTLE Gram Stain Report Called to,Read Back By and Verified With: VILLALOBOS,S ON 12/19/22 AT 2300 BY LOY,C Organism ID to follow CRITICAL RESULT CALLED TO, READ BACK BY AND VERIFIED WITH: S VILLALOBOS,RN@0340  12/20/22 MK    Culture (A)  Final    STAPHYLOCOCCUS EPIDERMIDIS THE SIGNIFICANCE OF ISOLATING THIS ORGANISM FROM A SINGLE SET OF BLOOD CULTURES WHEN MULTIPLE SETS ARE DRAWN IS UNCERTAIN. PLEASE NOTIFY THE MICROBIOLOGY DEPARTMENT WITHIN ONE WEEK IF SPECIATION AND SENSITIVITIES ARE REQUIRED. Performed at [facility], [address], [place], [place] [address]    Report Status 12/22/2022 FINAL  Final  Blood Culture ID Panel (Reflexed)     Status: Abnormal   Collection Time: 12/19/22 12:15 AM  Result Value Ref Range Status   Enterococcus faecalis NOT DETECTED NOT DETECTED Final   Enterococcus Faecium NOT DETECTED NOT DETECTED Final   Listeria monocytogenes NOT DETECTED NOT DETECTED Final   Staphylococcus species DETECTED (A) NOT DETECTED Final    Comment: CRITICAL RESULT CALLED TO, READ BACK BY AND VERIFIED WITH: S  VILLALOBOS,RN@0340  12/20/22 MK    Staphylococcus aureus (BCID) NOT DETECTED NOT DETECTED Final   Staphylococcus epidermidis DETECTED (A) NOT DETECTED Final    Comment: Methicillin (oxacillin) resistant coagulase negative staphylococcus. Possible blood culture contaminant (unless isolated from more than one blood culture draw or clinical case suggests pathogenicity). No antibiotic treatment is indicated for blood  culture contaminants. CRITICAL RESULT CALLED TO, READ BACK BY AND VERIFIED WITH: S VILLALOBOS,RN@0340  12/20/22 MK    Staphylococcus lugdunensis NOT DETECTED NOT DETECTED Final   Streptococcus species NOT DETECTED NOT DETECTED Final   Streptococcus agalactiae NOT DETECTED NOT DETECTED Final   Streptococcus pneumoniae NOT DETECTED NOT DETECTED Final   Streptococcus pyogenes NOT DETECTED NOT DETECTED Final   A.calcoaceticus-baumannii NOT DETECTED NOT DETECTED Final   Bacteroides fragilis NOT DETECTED NOT DETECTED Final   Enterobacterales NOT DETECTED NOT DETECTED Final   Enterobacter cloacae complex NOT DETECTED NOT DETECTED Final   Escherichia coli NOT DETECTED NOT DETECTED Final   Klebsiella aerogenes NOT DETECTED NOT DETECTED Final   Klebsiella oxytoca NOT DETECTED NOT DETECTED Final   Klebsiella pneumoniae NOT DETECTED NOT DETECTED Final   Proteus species NOT DETECTED NOT DETECTED Final   Salmonella species NOT DETECTED NOT DETECTED Final   Serratia marcescens NOT DETECTED NOT DETECTED Final   Haemophilus influenzae NOT DETECTED NOT DETECTED Final   Neisseria meningitidis NOT DETECTED NOT DETECTED Final   Pseudomonas aeruginosa NOT DETECTED NOT DETECTED Final   Stenotrophomonas maltophilia NOT DETECTED NOT DETECTED Final   Candida albicans NOT DETECTED NOT DETECTED Final   Candida auris NOT DETECTED NOT DETECTED Final   Candida glabrata NOT DETECTED NOT DETECTED Final   Candida krusei NOT DETECTED NOT DETECTED Final   Candida parapsilosis NOT DETECTED NOT DETECTED Final    Candida tropicalis NOT DETECTED NOT DETECTED Final   Cryptococcus neoformans/gattii NOT DETECTED NOT DETECTED Final   Methicillin resistance mecA/C DETECTED (A)  NOT DETECTED Final    Comment: CRITICAL RESULT CALLED TO, READ BACK BY AND VERIFIED WITH: S VILLALOBOS,RN@0340  12/20/22 MK Performed at [facility], [address], [place], [place] [address]   MRSA Next Gen by PCR, Nasal     Status: None   Collection Time: 12/19/22  5:07 AM   Specimen: Nasal Mucosa; Nasal Swab  Result Value Ref Range Status   MRSA by PCR Next Gen NOT DETECTED NOT DETECTED Final    Comment: (NOTE) The GeneXpert MRSA Assay (FDA approved for NASAL specimens only), is one component of a comprehensive MRSA colonization surveillance program. It is not intended to diagnose MRSA infection nor to guide or monitor treatment for MRSA infections. Test performance is not FDA approved in patients less than 42 years old. Performed at [facility], [address], [place], [place] [address]   Aerobic Culture w Gram Stain (superficial specimen)     Status: None   Collection Time: 12/19/22  2:40 PM   Specimen: Abscess  Result Value Ref Range Status   Specimen Description   Final    ABSCESS Performed at [facility], [address], [place], [place] [address]    Special Requests   Final    BUTTOCKS Performed at [facility], [address] [place], [place] [address]    Gram Stain   Final    FEW WBC PRESENT,BOTH PMN AND MONONUCLEAR RARE GRAM POSITIVE COCCI IN PAIRS RARE GRAM POSITIVE COCCI IN CHAINS    Culture   Final    RARE NORMAL SKIN FLORA Performed at [facility], [address] [place], [place] [address]    Report Status 12/22/2022 FINAL  Final  MRSA Next Gen by PCR, Nasal     Status: None   Collection Time: 12/22/22 11:30 AM   Specimen: Nasal Mucosa; Nasal Swab  Result Value Ref Range Status   MRSA by PCR Next Gen NOT DETECTED NOT DETECTED Final    Comment: (NOTE) The GeneXpert MRSA Assay (FDA  approved for NASAL specimens only), is one component of a comprehensive MRSA colonization surveillance program. It is not intended to diagnose MRSA infection nor to guide or monitor treatment for MRSA infections. Test performance is not FDA approved in patients less than 32 years old. Performed at [facility], [address] [place], [place] [address]     Labs: CBC: Recent Labs  Lab 12/18/22 2344 12/19/22 0437 12/20/22 0452 12/21/22 0511 12/22/22 0442 12/23/22 0433 12/24/22 0452  WBC 24.7*   < > 18.9* 18.6* 18.7* 19.1* 18.3*  NEUTROABS 21.0*  --   --   --   --   --   --   HGB 7.9*   < > 6.8* 7.5* 7.7* 8.6* 8.2*  HCT 29.6*   < > 25.7* 25.8* 27.6* 30.1* 30.0*  MCV 76.1*   < > 75.4* 75.4* 77.1* 78.4* 79.8*  PLT 610*   < > 501* 434* 446* 381 338   < > = values in this interval not displayed.   Basic Metabolic Panel: Recent Labs  Lab 12/19/22 0437 12/20/22 0452 12/21/22 0511 12/22/22 0442 12/23/22 0433 12/24/22 0452  NA 136 133* 133* 134* 132* 132*  K 4.8 4.6 4.3 4.4 4.8 4.9  CL 101 103 101 103 103 106  CO2 24 23 20* 21* 20* 18*  GLUCOSE 151* 126* 202* 257* 195* 289*  BUN 28* 30* 32* 25* 23 22  CREATININE 1.96* 2.08* 2.04* 1.48* 1.45* 1.37*  CALCIUM 8.8* 8.1* 8.0*  8.2* 8.1* 8.4*  MG 2.6* 2.4  --  2.3  --  2.1  PHOS 3.8 3.7  --   --   --   --    Liver Function Tests: Recent Labs  Lab 12/18/22 2344  AST 14*  ALT 11  ALKPHOS 128*  BILITOT 0.7  PROT 7.9  ALBUMIN 2.9*   CBG: Recent Labs  Lab 12/18/22 2327 12/23/22 2117  GLUCAP 122* 271*    Discharge time spent: greater than 30 minutes.  Signed: Catarina Hartshorn, MD [facility] 12/24/2022

## 2022-12-26 ENCOUNTER — Ambulatory Visit: Payer: Self-pay | Admitting: *Deleted

## 2022-12-26 ENCOUNTER — Encounter: Payer: Self-pay | Admitting: *Deleted

## 2022-12-26 NOTE — Patient Outreach (Addendum)
  Care Coordination   12/26/2022 Name: [name] MRN: [id number] DOB: 1956-01-08   Care Coordination Outreach Attempts:  An unsuccessful telephone outreach was attempted for a scheduled appointment today. Daughter, [name], returned my call and left a voicemail. My attempt to return her call was also unsuccessful.   Follow Up Plan:  Additional outreach attempts will be made to offer the patient care coordination information and services.   Encounter Outcome:  No Answer. Left HIPAA compliant VM with request to return my call.    Care Coordination Interventions:  No, not indicated    Demetrios Loll, RN, BSN Care Manager [place]  [facility]  Population Health  Direct Dial: [phone] Main #: [phone]

## 2022-12-26 NOTE — Patient Outreach (Signed)
Care Coordination   Follow Up Visit Note   12/26/2022 Name: [name] MRN: [id number] DOB: 1955/05/21  [name] is a 67 y.o. year old male who sees Russell, [name], MD for primary care. I  spoke with patient's daughter, [name], by telephone today.   What matters to the patients health and wellness today?  Managing care after hospital discharge    Goals Addressed             This Visit's Progress    Care Management Needs   Not on track    Care Management Goals: Patient will schedule Hospital follow-up appointment with PCP and discuss needed DME equipment Patient will reach out to provider with any new or worsening symptoms Patient will take medication as prescribed Discontinued: furosemide 40mg , jardiance 10mg , tramadol 50mg   Added: Augmentin 875-125 mg, doxycycline 100mg , lantus 5 units daily, torsemide 20mg  Patient will obtain a blood pressure monitor Patient will obtain a glucose meter Patient will obtain scales Patient will obtain shower chair Patient will use rolling walker for ambulation Patient will work with [facility] [phone]) HHPT for strengthening and gait training Patient/caregiver will reach out to RN Care Manager at [phone] as needed        SDOH assessments and interventions completed:  No Interventions Today    Flowsheet Row Most Recent Value  Chronic Disease   Chronic disease during today's visit Diabetes, Hypertension (HTN), Congestive Heart Failure (CHF)  [Hospitalized for Severe Sepsis, perirectal abscess, bacteremia, chronic CHF, CKD, meningioma,iron deficiency anemia, DM, CAD, peripheral neuropathy, PAD. Discharged on 12/24/22.]  General Interventions   General Interventions Discussed/Reviewed General Interventions Discussed, General Interventions Reviewed, Durable Medical Equipment (DME), Doctor Visits, Level of Care  Labs --  [repeat BMP and CBC in 1 week]  Doctor Visits Discussed/Reviewed Doctor  Visits Discussed, Doctor Visits Reviewed, PCP  [reviewed inpatient notes]  Durable Medical Equipment (DME) [name], Other  [needs shower chair, glucometer, blood pressure monitor]  PCP/Specialist Visits Compliance with follow-up visit  [schedule hospital F/U with PCP in 1-2 weeks. Discuss DME needs.]  Level of Care --  [patient did not qualify for discharge to SNF for short term rehab after inpatient stay per physical therapy evaluation]  Exercise Interventions   Exercise Discussed/Reviewed Physical Activity  Physical Activity Discussed/Reviewed Physical Activity Discussed, Physical Activity Reviewed  [facility] [phone]) HHPT ordered. Will need to verify if they have contacted patient.]  Education Interventions   Education Provided Provided Education  Provided Verbal Education On Labs, Medication, When to see the doctor, Other, Nutrition  [Needs face-to-face visit to order DME supplies]  Nutrition Interventions   Nutrition Discussed/Reviewed Nutrition Discussed, Nutrition Reviewed, Carbohydrate meal planning, Portion sizes  [carbohydrate modified diet per discharge instructions]  Pharmacy Interventions   Pharmacy Dicussed/Reviewed Pharmacy Topics Discussed, Pharmacy Topics Reviewed, Medications and their functions  [Adjustments at discharge: Stop-furosemide 40mg , jardiance 10mg , tramadol 50mg . Add- Augmentin 875-125 mg, doxycycline 100mg , lantus 5 units daily, torsemide 20mg . Daughter verified he picked up antibiotics]  Safety Interventions   Safety Discussed/Reviewed Safety Discussed, Safety Reviewed, Fall Risk, Home Safety  Home Safety Assistive Devices       Care Coordination Interventions:  Yes, provided   Follow up plan: Follow up call scheduled for 12/27/22 with patient and caregiver    Encounter Outcome:  Patient Visit Completed   Russell Loll, RN, BSN Care Manager [place]  [facility]  Population Health  Direct Dial: [phone] Main  #: [phone]

## 2022-12-27 ENCOUNTER — Encounter: Payer: Self-pay | Admitting: *Deleted

## 2022-12-27 ENCOUNTER — Ambulatory Visit: Payer: 59 | Admitting: *Deleted

## 2022-12-27 NOTE — Patient Outreach (Signed)
Care Coordination   Follow Up Visit Note   12/27/2022 Name: TYVELL PRICKETT MRN: [id number] DOB: 1955/12/09  IMAN HAGNER is a 67 y.o. year old male who sees [name], [name], MD for primary care. I  spoke with friend/caregiver [name] with patient's permission.   What matters to the patients health and wellness today?  No specific concerns endorsed today by patient or caregiver.    Goals Addressed             This Visit's Progress    Care Management Needs   On track    Care Management Goals: Patient/caregiver will schedule Hospital follow-up appointment with PCP and discuss needed DME equipment blood pressure monitor glucose meter or continuous glucose monitor scales shower chair Patient will reach out to provider with any new or worsening symptoms Patient will take medication as prescribed Discontinued: furosemide 40mg , jardiance 10mg , tramadol 50mg   Added: Augmentin 875-125 mg, doxycycline 100mg , lantus 5 units daily, torsemide 20mg  Patient will not inject insulin without checking blood sugar Patient will use rolling walker for ambulation Patient will work with [facility] [phone]) HHPT for strengthening and gait training Patient/caregiver will reach out to RN Care Manager at [phone] as needed        SDOH assessments and interventions completed:  Yes  SDOH Interventions Today    Flowsheet Row Most Recent Value  SDOH Interventions   Housing Interventions Intervention Not Indicated  Transportation Interventions Patient Resources (Friends/Family)      Care Coordination Interventions:  Yes, provided  Interventions Today    Flowsheet Row Most Recent Value  Chronic Disease   Chronic disease during today's visit Diabetes, Congestive Heart Failure (CHF), Hypertension (HTN), Other  [Hospitalized for Severe Sepsis, perirectal abscess, bacteremia, chronic CHF, CKD, meningioma,iron deficiency anemia, DM, CAD, peripheral  neuropathy, PAD. Discharged on 12/24/22.]  General Interventions   General Interventions Discussed/Reviewed General Interventions Discussed, General Interventions Reviewed, Durable Medical Equipment (DME), Communication with, Doctor Visits  Labs --  [repeat BMP and CBC in 1 week]  Doctor Visits Discussed/Reviewed Doctor Visits Discussed, Doctor Visits Reviewed, PCP  Durable Medical Equipment (DME) Other, Walker  [needs shower chair, glucometer or continuous glucose monitor, blood pressure monitor, and scales]  PCP/Specialist Visits Compliance with follow-up visit  [schedule hospital F/U with PCP in 1-2 weeks. Discuss DME needs.]  Communication with PCP/Specialists  [Secure fax to PCP with hospital discharge notes. Patient needs to F/U in 1-2 weeks and needs repeat labs in 1 week. Needs DME orders.]  Exercise Interventions   Exercise Discussed/Reviewed Physical Activity, Assistive device use and maintanence  [using walker for ambulation. Will work with HHPT on strength and gait training.]  Physical Activity Discussed/Reviewed Physical Activity Reviewed, Physical Activity Discussed  Education Interventions   Education Provided Provided Printed Education  Provided Verbal Education On When to see the doctor, Medication  [S/S of infection]  Nutrition Interventions   Nutrition Discussed/Reviewed Nutrition Discussed, Nutrition Reviewed, Carbohydrate meal planning, Portion sizes, Decreasing sugar intake  Pharmacy Interventions   Pharmacy Dicussed/Reviewed Pharmacy Topics Discussed, Pharmacy Topics Reviewed, Medications and their functions  [Adjustments at discharge: Stop-furosemide 40mg , jardiance 10mg , tramadol 50mg . Add- Augmentin 875-125 mg, doxycycline 100mg , lantus 5 units daily, torsemide 20mg . Harriett Sine verified taking as directed except for Lantus.]  Medication Adherence Not taking medication  [Patient is not taking Lantus. Needs to be able to check blood sguar before administering Insulin.]  Safety  Interventions   Safety Discussed/Reviewed Safety Discussed, Safety Reviewed, Fall Risk, Home Safety  Home Safety Assistive Devices, Contact home health agency  [facility] [phone]) and confirmed receipt of referral placed at discharge. Provided with additional contact numbers.]       Follow up plan: Follow up call scheduled for 01/10/23    Encounter Outcome:  Patient Visit Completed   Demetrios Loll, RN, BSN Care Manager [place]  [facility]  Population Health  Direct Dial: [phone] Main #: [phone]

## 2022-12-28 ENCOUNTER — Encounter (INDEPENDENT_AMBULATORY_CARE_PROVIDER_SITE_OTHER): Payer: Self-pay | Admitting: *Deleted

## 2022-12-29 DIAGNOSIS — K610 Anal abscess: Secondary | ICD-10-CM | POA: Diagnosis not present

## 2022-12-29 DIAGNOSIS — D500 Iron deficiency anemia secondary to blood loss (chronic): Secondary | ICD-10-CM | POA: Diagnosis not present

## 2022-12-29 DIAGNOSIS — N1832 Chronic kidney disease, stage 3b: Secondary | ICD-10-CM | POA: Diagnosis not present

## 2022-12-29 DIAGNOSIS — R6520 Severe sepsis without septic shock: Secondary | ICD-10-CM | POA: Diagnosis not present

## 2022-12-29 DIAGNOSIS — E1165 Type 2 diabetes mellitus with hyperglycemia: Secondary | ICD-10-CM | POA: Diagnosis not present

## 2022-12-29 DIAGNOSIS — I2510 Atherosclerotic heart disease of native coronary artery without angina pectoris: Secondary | ICD-10-CM | POA: Diagnosis not present

## 2023-01-02 ENCOUNTER — Ambulatory Visit: Payer: Self-pay | Admitting: *Deleted

## 2023-01-02 NOTE — Patient Instructions (Signed)
Visit Information  Thank you for taking time to visit with me today. Please don't hesitate to contact me if I can be of assistance to you.   Following are the goals we discussed today:   Goals Addressed               This Visit's Progress     Find Help in My Community. (pt-stated)   On track     Care Coordination Interventions:  Interventions Today    Flowsheet Row Most Recent Value  Chronic Disease   Chronic disease during today's visit Other, Diabetes, Congestive Heart Failure (CHF), Hypertension (HTN), Chronic Kidney Disease/End Stage Renal Disease (ESRD)  [Meningioma with Encephalopathy & Cerebral Edema, Cocaine Abuse, Tobacco Abuse, Inability to Perform Activities of Daily Living Independently, Caregiver Burnout, Stress & Fatigue, Chronic Pain]  General Interventions   General Interventions Discussed/Reviewed General Interventions Discussed, Labs, Vaccines, Doctor Visits, Health Screening, Annual Foot Exam, General Interventions Reviewed, Annual Eye Exam, Lipid Profile, Durable Medical Equipment (DME), Community Resources, Level of Care, Communication with  [Communication with Care Team Members]  Labs Hgb A1c every 3 months, Kidney Function  [Encouraged]  Vaccines COVID-19, Flu, Pneumonia, RSV, Shingles, Tetanus/Pertussis/Diphtheria  [Encouraged]  Doctor Visits Discussed/Reviewed Doctor Visits Discussed, Specialist, Doctor Visits Reviewed, Annual Wellness Visits, PCP  [Encouraged Routine Engagement]  Health Screening Bone Density, Colonoscopy, Prostate  [Encouraged]  Durable Medical Equipment (DME) Dan Humphreys, Other  [Rollator Walker]  PCP/Specialist Visits Compliance with follow-up visit  [Encouraged Routine Engagement]  Communication with PCP/Specialists, Charity fundraiser, Pharmacists, Social Work  [employer] Routine Engagement]  Level of Care Adult Daycare, Air traffic controller, Assisted Living, Skilled Nursing Facility  [Confirmed Disinterest in Enrollment in Adult Day Care Program or Pursuing Higher  Level of Care Placement Options (I.e Assisted Living or Skilled Nursing Facility).]  Applications Medicaid, Personal Care Services  [Confirmed Interest in Reapplying for Medicaid & Personal Care Services,  Applications Mailed & Offered to Assist with Completion & Submission.]  Exercise Interventions   Exercise Discussed/Reviewed Exercise Discussed, Assistive device use and maintanence, Exercise Reviewed, Physical Activity, Weight Managment  [Encouraged]  Physical Activity Discussed/Reviewed Physical Activity Discussed, Home Exercise Program (HEP), Physical Activity Reviewed, PREP, Gym, Types of exercise  [Encouraged]  Weight Management Weight loss  [Encouraged]  Education Interventions   Education Provided Provided Therapist, sports, Provided Web-based Education, Provided Education  [Encouraged Review.]  Provided Engineer, petroleum On Nutrition, Mental Health/Coping with Illness, When to see the doctor, Foot Care, Eye Care, Labs, Blood Sugar Monitoring, Applications, Exercise, Medication, [employer], Development worker, community  [Encouraged Consideration & Implementation.]  Labs Reviewed --  [Low Hemoglobin Level,  Encouraged to Follow-Up with Primary Care Provider.]  Applications Medicaid, Personal Care Services  [Confirmed Interest in Reapplying for Medicaid & Personal Care Services,  Applications Mailed & Offered to Assist with Completion & Submission.]  Mental Health Interventions   Mental Health Discussed/Reviewed Mental Health Discussed, Anxiety, Depression, Grief and Loss, Mental Health Reviewed, Substance Abuse, Coping Strategies, Suicide, Crisis, Other  [Assessed Mental Health Status & Cognitive Status.]  Nutrition Interventions   Nutrition Discussed/Reviewed Nutrition Discussed, Adding fruits and vegetables, Increasing proteins, Decreasing fats, Nutrition Reviewed, Fluid intake, Decreasing salt, Portion sizes, Carbohydrate meal planning, Decreasing sugar intake  [Encouraged]  Pharmacy  Interventions   Pharmacy Dicussed/Reviewed Pharmacy Topics Discussed, Medications and their functions, Medication Adherence, Pharmacy Topics Reviewed, Affording Medications  [Discussed Importance of Compliance.]  Medication Adherence --  [Confirmed Compliance with Medications, Administered by Orest Dikes Hicks.]  Referral to Pharmacist --  [Confirmed Ability to [facility] Prescription  Medications.]  Safety Interventions   Safety Discussed/Reviewed Safety Discussed, Safety Reviewed, Fall Risk, Home Safety  [Fall Risk, Unsteady Balance/Gait, History of Falls & Encouraged Routine Use of Assistive Devices.]  Home Safety Assistive Devices, Need for home safety assessment, Contact provider for referral to PT/OT, Refer for community resources, Refer for home visit  [Encouraged Consideration of Home Safety Evaluation,  Request Placed for Orders for Home Health Physical & Occupational Therapy Services,  Encouraged Routine Use of Rollator Walker]  Advanced Directive Interventions   Advanced Directives Discussed/Reviewed Advanced Directives Discussed, Advanced Directives Reviewed  [Encouraged Initiation of Advanced Directives, Offering to [employer] & Assist with Completion.]      Active Listening & Reflection Utilized. Verbalization of Feelings Encouraged. Emotional Support Provided. Acceptance & Commitment Therapy Indicated. Cognitive Behavioral Therapy Implemented. Client-Centered Therapy Initiated. Encouraged Routine Engagement with Dr. Avon Gully, Primary Care Provider [phone]), & Offered Assistance with [facility] Follow-Up Visit, Via 3-Way Call with Scheduler. CSW Collaboration with Dr. Avon Gully, Primary Care Provider [phone]# [phone]), Via Routed Note in Epic, to Request Orders for The Following Durable Medical Equipment:  ~ Blood Pressure Monitor ~ Glucose Meter  ~ Continuous Glucose Monitor ~ Scales ~ Shower Chair with Back Encouraged Routine Engagement with Home Health  Physical Therapist through [facility] (# [phone]), for Strengthening, Conditioning, Mobility, Investment banker, operational, Safety, Ambulation, Etc.  CSW Collaboration with Daughter, Zeferino Belshe to Confirm Interest in Pursuing Higher Level of Care Placement Options (I.e Assisted Living, Extended Care, Memory Care Assisted Living & Skilled Nursing).  CSW Collaboration with Daughter, Ona Sedgwick to [employer] Receipt of The Following [employer], Emailed ([email or web address]) on 01/02/2023:         ~ Assisted Living Facilities in [place], [place], [place], [employer] &                [facility]         ~ Extended [employer] in [place], [place], [place], [employer] &                [place]        ~ Memory Care Assisted Living Facilities in [place], [place], [place],            [employer] & [place]        ~ Skilled Nursing Facilities in [place], [place], [place], [place] &            [facility]  CSW Collaboration with Daughter, Lisa Rask to Encourage Routine Engagement with Danford Bad, Licensed Clinical Social Worker with [facility] [phone]), if She Has Questions, Needs Assistance, or If Additional Social Work Needs Are Identified Between Now & Our Next Follow-Up Outreach Call, Scheduled on 01/17/2023 at 11:30 AM.      Our next appointment is by telephone on 01/17/2023 at 11:30 am.  Please call the care guide team at [phone] if you need to cancel or reschedule your appointment.   If you are experiencing a Mental Health or Behavioral Health Crisis or need someone to talk to, please call the Suicide and Crisis Lifeline: 988 call the [place] National Suicide Prevention Lifeline: 727-138-8200 or TTY: 445-333-0886 TTY 7801222678) to talk to a trained counselor call 1-800-273-TALK (toll free, 24 hour hotline) go to [facility] [address], [place]  [phone]) call the [facility] Crisis Line: 680-218-6469 call 911  Patient verbalizes understanding of instructions and care plan provided today and agrees to view in MyChart. Active MyChart status and  patient understanding of how to access instructions and care plan via MyChart confirmed with patient.     Telephone follow up appointment with care management team member scheduled for:  01/17/2023 at 11:30 am.  Danford Bad, BSW, MSW, LCSW  Embedded Practice Social Work Case Manager  [facility], Population Health Direct Dial: [phone]  Fax: [phone] Email: [email or web address] Website: [email or web address]

## 2023-01-02 NOTE — Patient Outreach (Signed)
Care Coordination   Follow Up Visit Note   01/02/2023  Name: [name] MRN: [id number] DOB: 1955/08/25  [name] is a 67 y.o. year old male who sees [name], Russell Salinas, MD for primary care. I spoke with patient's daughter, [name] by phone today.  What matters to the patients health and wellness today?  Find Help in My Community.   Goals Addressed               This Visit's Progress     Find Help in My Community. (pt-stated)   On track     Care Coordination Interventions:  Interventions Today    Flowsheet Row Most Recent Value  Chronic Disease   Chronic disease during today's visit Other, Diabetes, Congestive Heart Failure (CHF), Hypertension (HTN), Chronic Kidney Disease/End Stage Renal Disease (ESRD)  [Meningioma with Encephalopathy & Cerebral Edema, Cocaine Abuse, Tobacco Abuse, Inability to Perform Activities of Daily Living Independently, Caregiver Burnout, Stress & Fatigue, Chronic Pain]  General Interventions   General Interventions Discussed/Reviewed General Interventions Discussed, Labs, Vaccines, Doctor Visits, Health Screening, Annual Foot Exam, General Interventions Reviewed, Annual Eye Exam, Lipid Profile, Durable Medical Equipment (DME), Community Resources, Level of Care, Communication with  [Communication with Care Team Members]  Labs Hgb A1c every 3 months, Kidney Function  [Encouraged]  Vaccines COVID-19, Flu, Pneumonia, RSV, Shingles, Tetanus/Pertussis/Diphtheria  [Encouraged]  Doctor Visits Discussed/Reviewed Doctor Visits Discussed, Specialist, Doctor Visits Reviewed, Annual Wellness Visits, PCP  [Encouraged Routine Engagement]  Health Screening Bone Density, Colonoscopy, Prostate  [Encouraged]  Durable Medical Equipment (DME) Dan Humphreys, Other  [Rollator Walker]  PCP/Specialist Visits Compliance with follow-up visit  [Encouraged Routine Engagement]  Communication with PCP/Specialists, Charity fundraiser, Pharmacists, Social Work  [employer] Routine  Engagement]  Level of Care Adult Daycare, Air traffic controller, Assisted Living, Skilled Nursing Facility  [Confirmed Disinterest in Enrollment in Adult Day Care Program or Pursuing Higher Level of Care Placement Options (I.e Assisted Living or Skilled Nursing Facility).]  Applications Medicaid, Personal Care Services  [Confirmed Interest in Reapplying for Medicaid & Personal Care Services,  Applications Mailed & Offered to Assist with Completion & Submission.]  Exercise Interventions   Exercise Discussed/Reviewed Exercise Discussed, Assistive device use and maintanence, Exercise Reviewed, Physical Activity, Weight Managment  [Encouraged]  Physical Activity Discussed/Reviewed Physical Activity Discussed, Home Exercise Program (HEP), Physical Activity Reviewed, PREP, Gym, Types of exercise  [Encouraged]  Weight Management Weight loss  [Encouraged]  Education Interventions   Education Provided Provided Therapist, sports, Provided Web-based Education, Provided Education  [Encouraged Review.]  Provided Engineer, petroleum On Nutrition, Mental Health/Coping with Illness, When to see the doctor, Foot Care, Eye Care, Labs, Blood Sugar Monitoring, Applications, Exercise, Medication, [employer], Development worker, community  [Encouraged Consideration & Implementation.]  Labs Reviewed --  [Low Hemoglobin Level,  Encouraged to Follow-Up with Primary Care Provider.]  Applications Medicaid, Personal Care Services  [Confirmed Interest in Reapplying for Medicaid & Personal Care Services,  Applications Mailed & Offered to Assist with Completion & Submission.]  Mental Health Interventions   Mental Health Discussed/Reviewed Mental Health Discussed, Anxiety, Depression, Grief and Loss, Mental Health Reviewed, Substance Abuse, Coping Strategies, Suicide, Crisis, Other  [Assessed Mental Health Status & Cognitive Status.]  Nutrition Interventions   Nutrition Discussed/Reviewed Nutrition Discussed, Adding fruits and vegetables, Increasing  proteins, Decreasing fats, Nutrition Reviewed, Fluid intake, Decreasing salt, Portion sizes, Carbohydrate meal planning, Decreasing sugar intake  [Encouraged]  Pharmacy Interventions   Pharmacy Dicussed/Reviewed Pharmacy Topics Discussed, Medications and their functions, Medication Adherence, Pharmacy Topics Reviewed, Affording Medications  [  Discussed Importance of Compliance.]  Medication Adherence --  [Confirmed Compliance with Medications, Administered by Orest Dikes Hicks.]  Referral to Pharmacist --  Edilia Bo Ability to Afford Prescription Medications.]  Safety Interventions   Safety Discussed/Reviewed Safety Discussed, Safety Reviewed, Fall Risk, Home Safety  [Fall Risk, Unsteady Balance/Gait, History of Falls & Encouraged Routine Use of Assistive Devices.]  Home Safety Assistive Devices, Need for home safety assessment, Contact provider for referral to PT/OT, Refer for community resources, Refer for home visit  [Encouraged Consideration of Home Safety Evaluation,  Request Placed for Orders for Home Health Physical & Occupational Therapy Services,  Encouraged Routine Use of Rollator Walker]  Advanced Directive Interventions   Advanced Directives Discussed/Reviewed Advanced Directives Discussed, Advanced Directives Reviewed  [Encouraged Initiation of Advanced Directives, Offering to [employer] & Assist with Completion.]      Active Listening & Reflection Utilized. Verbalization of Feelings Encouraged. Emotional Support Provided. Acceptance & Commitment Therapy Indicated. Cognitive Behavioral Therapy Implemented. Client-Centered Therapy Initiated. Encouraged Routine Engagement with Dr. Avon Gully, Primary Care Provider [phone]), & Offered Assistance with [facility] Follow-Up Visit, Via 3-Way Call with Scheduler. CSW Collaboration with Dr. Avon Gully, Primary Care Provider [phone]# [phone]), Via Routed Note in Epic, to Request Orders for The Following Durable Medical  Equipment:  ~ Blood Pressure Monitor ~ Glucose Meter  ~ Continuous Glucose Monitor ~ Scales ~ Shower Chair with Back Encouraged Routine Engagement with Home Health Physical Therapist through [facility] [place] (# [phone]), for Strengthening, Conditioning, Mobility, Investment banker, operational, Safety, Ambulation, Etc.  CSW Collaboration with Daughter, [name] to Confirm Interest in Pursuing Higher Level of Care Placement Options (I.e Assisted Living, Extended Care, Memory Care Assisted Living & Skilled Nursing).  CSW Collaboration with Daughter, [name] to [employer] Receipt of The Following [employer], Emailed ([email or web address]) on 01/02/2023:         ~ Assisted Living Facilities in [place], [place], [place], [employer] &                  [facility]         ~ Extended [employer] in [place], [place], [place], [employer] &                  [place]        ~ Memory Care Assisted Living Facilities in [place], [place], [place],            [employer] & [place]        ~ Skilled Nursing Facilities in [place], [place], [place], [place] &    [facility]  CSW Collaboration with Daughter, [name] to Encourage Routine Engagement with Danford Bad, Licensed Clinical Social Worker with [facility] [phone]), if She Has Questions, Needs Assistance, or If Additional Social Work Needs Are Identified Between Now & Our Next Follow-Up Outreach Call, Scheduled on 01/17/2023 at 11:30 AM.      SDOH assessments and interventions completed:  Yes.  Care Coordination Interventions:  Yes, provided.   Follow up plan: Follow up call scheduled for 01/17/2023 at 11:30 am.  Encounter Outcome:  Patient Visit Completed.   Danford Bad, BSW, MSW, Printmaker Social Work Case Set designer Health  [facility], Population Health Direct Dial: [phone]  Fax: [phone] Email:  [email or web address] Website: [email or web address]

## 2023-01-10 ENCOUNTER — Ambulatory Visit: Payer: Self-pay | Admitting: *Deleted

## 2023-01-10 ENCOUNTER — Encounter: Payer: Self-pay | Admitting: *Deleted

## 2023-01-10 NOTE — Patient Outreach (Signed)
  Care Coordination   Follow Up Visit Note   01/10/2023 Name: [name] MRN: [id number] DOB: 1955/10/11  [name] is a 67 y.o. year old male who sees [name], Russell Demissie, MD for primary care. I  spoke with patient's daughter, [name], by telephone today. Patient was discharged from hospital back to his friend, [name]'s home. Per [name] and [name], patient has since moved back into his own home. Per daughter, she has not spoken to him since the move. I was unable to reach patient by telephone after two attempts today. Attempted to reach [name] again to see if she has talked with him since he moved back home, but was unable to reach her the 2nd time. I spoke with nurse [name], in person at Dr [name] office yesterday regarding his DME and follow-up needs.   What matters to the patients health and wellness today?  Was unable to speak with patient directly   SDOH assessments and interventions completed:  No   Care Coordination Interventions:  Yes, provided  Interventions Today    Flowsheet Row Most Recent Value  Chronic Disease   Chronic disease during today's visit Diabetes, Congestive Heart Failure (CHF), Hypertension (HTN)  General Interventions   General Interventions Discussed/Reviewed General Interventions Reviewed, Communication with, Durable Medical Equipment (DME)  Doctor Visits Discussed/Reviewed Doctor Visits Reviewed  Durable Medical Equipment (DME) [name], Other  [needs shower chair, glucometer or continuous glucose monitor, blood pressure monitor, and scales]  PCP/Specialist Visits Compliance with follow-up visit  [schedule hospital F/U with PCP in 1-2 weeks. Discuss DME needs.]  Communication with PCP/Specialists  [Spoke with [name] at PCP office in person on 01/09/23 Re: DME needs. Patient needs to schedule an Appt. [name] needs to know which type of shower chair he needs. She was going to send order for glucometer to pharmacy.]  Exercise Interventions   Physical  Activity Discussed/Reviewed Physical Activity Reviewed, Physical Activity Discussed  [Spoke with daughter, [name], she was unaware if [facility] has reached out. Per conversation with Home Health last week, they had her number listed as the primary contact.]  Pharmacy Interventions   Medication Adherence --  [Daughter was to talk with pharmacy about prepackaging medications. I advised PCP office of this. They agreed that it was a good idea.]       Follow up plan: Follow up call scheduled for 01/12/23    Encounter Outcome:  Patient Visit Completed   Russell Pellet, RN, BSN Care Manager [place]  [facility]  Population Health  Direct Dial: [phone] Main #: [phone]

## 2023-01-12 ENCOUNTER — Encounter: Payer: Self-pay | Admitting: *Deleted

## 2023-01-12 ENCOUNTER — Ambulatory Visit: Payer: 59 | Admitting: *Deleted

## 2023-01-12 NOTE — Patient Outreach (Signed)
  Care Coordination   Follow Up Visit Note   01/12/2023 Name: [name] MRN: [id number] DOB: 30-May-1955  [name] is a 68 y.o. year old male who sees [name], Tesfaye Demissie, MD for primary care. I  spoke with patient's daughter, Tucker Minter, by telephone today after returning her call. I have been unable to reach patient on his phone since he was discharged from the hospital. He has given verbal permission to speak with [name].    What matters to the patients health and wellness today? Daughter has Level of care concerns    SDOH assessments and interventions completed:  No  Care Coordination Interventions:  Yes, provided  Interventions Today    Flowsheet Row Most Recent Value  Chronic Disease   Chronic disease during today's visit Congestive Heart Failure (CHF), Chronic Kidney Disease/End Stage Renal Disease (ESRD)  General Interventions   General Interventions Discussed/Reviewed General Interventions Discussed, General Interventions Reviewed, Doctor Visits, Level of Care  Doctor Visits Discussed/Reviewed Doctor Visits Discussed, Doctor Visits Reviewed, PCP  Durable Medical Equipment (DME) [name], Academic Librarian with Social Work  [interested in Assisted Living Facility]  Level of Care Assisted Living  Exercise Interventions   Exercise Discussed/Reviewed Physical Activity  Physical Activity Discussed/Reviewed Physical Activity Discussed, Physical Activity Reviewed  [Per daughter, needs assistance with medication management and some ADLs. Requesting assistance with Assisted Living Facility Placement. Established with LCSW.]  Education Interventions   Education Provided Provided Education  Provided Verbal Education On When to see the doctor, Medication, Exercise  Mental Health Interventions   Mental Health Discussed/Reviewed Refer to Social Work for resources  Refer to Social Work for resources regarding Assisted Living/Skilled Nursing  Facility  Pharmacy Interventions   Pharmacy Dicussed/Reviewed Pharmacy Topics Discussed, Pharmacy Topics Reviewed, Medication Adherence  Safety Interventions   Safety Discussed/Reviewed Safety Discussed, Safety Reviewed, Fall Risk, Home Safety  [high risk for falls]  Home Safety Assistive Devices       Follow up plan: Follow up call scheduled for 01/17/23    Encounter Outcome:  Patient Visit Completed   [name], RN, BSN Care Manager [place]  [facility]  Population Health  Direct Dial: [phone] Main #: [phone]

## 2023-01-13 ENCOUNTER — Ambulatory Visit: Payer: Self-pay | Admitting: *Deleted

## 2023-01-13 DIAGNOSIS — E1121 Type 2 diabetes mellitus with diabetic nephropathy: Secondary | ICD-10-CM | POA: Insufficient documentation

## 2023-01-13 DIAGNOSIS — M48061 Spinal stenosis, lumbar region without neurogenic claudication: Secondary | ICD-10-CM | POA: Insufficient documentation

## 2023-01-13 DIAGNOSIS — F1120 Opioid dependence, uncomplicated: Secondary | ICD-10-CM | POA: Insufficient documentation

## 2023-01-13 DIAGNOSIS — M545 Low back pain, unspecified: Secondary | ICD-10-CM | POA: Insufficient documentation

## 2023-01-13 DIAGNOSIS — N4340 Spermatocele of epididymis, unspecified: Secondary | ICD-10-CM | POA: Insufficient documentation

## 2023-01-13 DIAGNOSIS — I509 Heart failure, unspecified: Secondary | ICD-10-CM | POA: Insufficient documentation

## 2023-01-13 DIAGNOSIS — E782 Mixed hyperlipidemia: Secondary | ICD-10-CM | POA: Insufficient documentation

## 2023-01-13 DIAGNOSIS — L732 Hidradenitis suppurativa: Secondary | ICD-10-CM | POA: Insufficient documentation

## 2023-01-13 DIAGNOSIS — N433 Hydrocele, unspecified: Secondary | ICD-10-CM | POA: Insufficient documentation

## 2023-01-13 DIAGNOSIS — E669 Obesity, unspecified: Secondary | ICD-10-CM | POA: Insufficient documentation

## 2023-01-13 DIAGNOSIS — I513 Intracardiac thrombosis, not elsewhere classified: Secondary | ICD-10-CM | POA: Insufficient documentation

## 2023-01-13 DIAGNOSIS — M170 Bilateral primary osteoarthritis of knee: Secondary | ICD-10-CM | POA: Insufficient documentation

## 2023-01-13 DIAGNOSIS — F1510 Other stimulant abuse, uncomplicated: Secondary | ICD-10-CM | POA: Insufficient documentation

## 2023-01-13 DIAGNOSIS — M2350 Chronic instability of knee, unspecified knee: Secondary | ICD-10-CM | POA: Insufficient documentation

## 2023-01-13 DIAGNOSIS — I861 Scrotal varices: Secondary | ICD-10-CM | POA: Insufficient documentation

## 2023-01-13 NOTE — Progress Notes (Deleted)
 Name: [name] DOB: [birth year] MRN: [id number]  History of Present Illness: Mr. Saiki is a 68 y.o. male who presents today for follow up visit at [facility].  ***He is accompanied by ***. - GU history: 1. Chronic intermittent bilateral scrotal pain.  - 04/30/2021: Scrotal / testicular ultrasound showed normal testes bilaterally and a 1 x 1 x 0.9 cm left epididymal cyst which was stable in size compared to study from 9/***/2022.  - Previously treated with antibiotics with only slight improvement. - Previously was taking Tramadol  and soaking in warm water . 2. History of necrotizing fasciitis. - 2021. Underwent I&D for gangrene of his left groin area. 3. Erectile dysfunction. - Risk factors include coronary artery disease, diabetes, hypercholesterolemia, beta-blocker use, and tobacco use.  - Previously used Sildenafil  25 mg daily.   At last visit with Dr. Roseann on 08/18/2021: - Consultation with his cardiologist was recommended prior to treatment with any additional oral therapy for erectile dysfunction.  - I discussed management of chronic scrotal pain.  I advised him that I do not recommend treatment of chronic pain with narcotic pain medication. Referral to Dr. Lovie at [facility] for chronic scrotal pain. I provided him with a refill of gabapentin  and tramadol  until he is able to see Dr. Lovie. - Keep scheduled appointments for CT and cystoscopy for evaluation of microscopic hematuria.   Since last visit: - 09/10/2021: Seen by Dr. Lovie at [facility]. ***note?  - Patient declined to schedule for CT and cystoscopy for evaluation of microscopic hematuria.  Today: He reports ***  Testicular pain began *** ago and has been ***intermittent / ***continuous. Known precipitating factors included ***. Pain is described as ***, ***unilateral / ***bilateral, and ***/10. Aggravating factors include ***. Relieving factors include ***. Pt {Actions;  denies-reports:120008} scrotal swelling, redness, or warmth. He {Actions; denies-reports:120008} feeling a scrotal mass.   He {Actions; denies-reports:120008} increased urinary urgency, frequency, nocturia, dysuria, gross hematuria, hesitancy, straining to void, or sensations of incomplete emptying.   Fall Screening: Do you usually have a device to assist in your mobility? {yes/no:20286} ***cane / ***walker / ***wheelchair   Medications: Current Outpatient Medications  Medication Sig Dispense Refill   amoxicillin -clavulanate (AUGMENTIN ) 875-125 MG tablet Take 1 tablet by mouth every 12 (twelve) hours. 14 tablet 0   aspirin  EC (ASPIRIN  LOW DOSE) 81 MG tablet Take 1 tablet (81 mg total) by mouth daily. Swallow whole.     atorvastatin  (LIPITOR ) 40 MG tablet Take 1 tablet (40 mg total) by mouth at bedtime. 30 tablet 2   carvedilol  (COREG ) 3.125 MG tablet Take 1 tablet (3.125 mg total) by mouth 2 (two) times daily with a meal. 60 tablet 2   doxycycline  (VIBRA -TABS) 100 MG tablet Take 1 tablet (100 mg total) by mouth every 12 (twelve) hours. 14 tablet 0   ferrous sulfate  325 (65 FE) MG tablet Take 1 tablet (325 mg total) by mouth daily with breakfast.     folic acid  (FOLVITE ) 1 MG tablet Take 1 tablet (1 mg total) by mouth daily.     gabapentin  (NEURONTIN ) 300 MG capsule Take 1 capsule (300 mg total) by mouth 3 (three) times daily. 90 capsule 1   insulin  glargine (LANTUS ) 100 UNIT/ML Solostar Pen Inject 5 Units into the skin daily. (Patient taking differently: Inject 5 Units into the skin at bedtime.) 15 mL 0   Insulin  Pen Needle 31G X 5 MM MISC Use with insulin  pen to dispense insulin  as directed 100 each  0   linaclotide  (LINZESS ) 145 MCG CAPS capsule Take 1 capsule (145 mcg total) by mouth daily before breakfast. 30 capsule 1   midodrine  (PROAMATINE ) 10 MG tablet Take 1 tablet (10 mg total) by mouth 3 (three) times daily with meals. 90 tablet 1   pantoprazole  (PROTONIX ) 40 MG tablet Take 1 tablet  (40 mg total) by mouth daily. 30 tablet 2   torsemide  (DEMADEX ) 20 MG tablet Take 1 tablet (20 mg total) by mouth daily. 30 tablet 1   No current facility-administered medications for this visit.    Allergies: Allergies  Allergen Reactions   Clopidogrel  Other (See Comments)    Drowsy, Skin irritation    Past Medical History:  Diagnosis Date   Bulging lumbar disc    CAD (coronary artery disease) [phone]   a. prior LAD stenting. b. s/p DES to [facility] 08/2015. c. 04/2016 Cardiac cath at [facility]. Patent stent in the PLAD and RCA. Diffuse dLAD, OM2, and  RPDA disease. d. DES to PDA and distal RCA 03/2019    Chronic lower back pain    CKD (chronic kidney disease), stage II    Cocaine abuse (HCC)    Cyst of epididymis    DM2 (diabetes mellitus, type 2) (HCC)    Essential hypertension    Fournier gangrene    GERD (gastroesophageal reflux disease)    Headache    History of pneumonia    Hyperlipidemia    Ischemic cardiomyopathy    LV (left ventricular) mural thrombus    Sleep apnea    Past Surgical History:  Procedure Laterality Date   APPENDECTOMY     BIOPSY  11/11/2022   Procedure: BIOPSY;  Surgeon: Cinderella Deatrice FALCON, MD;  [facility]: [facility];  Service: Endoscopy;;   BRONCHIAL NEEDLE ASPIRATION BIOPSY  11/12/2021   Procedure: BRONCHIAL NEEDLE ASPIRATION BIOPSIES;  Surgeon: Gladis Leonor HERO, MD;  Location: [facility] ENDOSCOPY;  Service: Pulmonary;;   CARDIAC CATHETERIZATION N/A 09/07/2015   Procedure: Left Heart Cath and Coronary Angiography;  Surgeon: Alm LELON Clay, MD;  Location: [facility] INVASIVE CV LAB;  Service: Cardiovascular;  Laterality: N/A;   CARDIAC CATHETERIZATION N/A 09/07/2015   Procedure: Coronary Stent Intervention;  Surgeon: Alm LELON Clay, MD;  Location: [facility] INVASIVE CV LAB;  Service: Cardiovascular;  Laterality: N/A;   COLONOSCOPY WITH PROPOFOL  N/A 11/11/2022   Procedure: COLONOSCOPY WITH PROPOFOL ;  Surgeon: Cinderella Deatrice FALCON, MD;  [facility]: [facility];  Service:  Endoscopy;  Laterality: N/A;   CORONARY ANGIOGRAM  09/07/13   residual RCA and OM disease   CORONARY ANGIOPLASTY WITH STENT PLACEMENT     CORONARY STENT INTERVENTION N/A 10/16/2018   Procedure: CORONARY STENT INTERVENTION;  Surgeon: Verlin Lonni BIRCH, MD;  Location: [facility] INVASIVE CV LAB;  Service: Cardiovascular;  Laterality: N/A;   CORONARY STENT INTERVENTION N/A 03/11/2019   Procedure: CORONARY STENT INTERVENTION;  Surgeon: Dann Candyce RAMAN, MD;  Location: [facility] INVASIVE CV LAB;  Service: Cardiovascular;  Laterality: N/A;   ESOPHAGOGASTRODUODENOSCOPY (EGD) WITH PROPOFOL  N/A 11/11/2022   Procedure: ESOPHAGOGASTRODUODENOSCOPY (EGD) WITH PROPOFOL ;  Surgeon: Cinderella Deatrice FALCON, MD;  [facility]: [facility];  Service: Endoscopy;  Laterality: N/A;   FRACTURE SURGERY     HEMOSTASIS CLIP PLACEMENT  11/11/2022   Procedure: HEMOSTASIS CLIP PLACEMENT;  Surgeon: Cinderella Deatrice FALCON, MD;  [facility];  Service: Endoscopy;;   INCISION AND DRAINAGE OF WOUND Left 05/19/2019   Procedure: DEBRIDEMENT LEFT GROIN;  Surgeon: Sebastian Moles, MD;  Location: [facility] OR;  Service:  General;  Laterality: Left;   INSERTION OF ILIAC STENT Left 11/30/2017   Left external illiac stent   INSERTION OF ILIAC STENT  11/30/2017   Procedure: Insertion Of Iliac Stent;  Surgeon: Court Dorn PARAS, MD;  Location: [facility] INVASIVE CV LAB;  Service: Cardiovascular;;  Left external illiac stent   KNEE ARTHROSCOPY Left    KNEE SURGERY     ligaments, cartilage; tendon, put a pin in (11/30/2017)   LEFT HEART CATH Bilateral 07/08/2012   Procedure: LEFT HEART CATH;  Surgeon: Candyce GORMAN Reek, MD;  Location: [facility] CATH LAB;  Service: Cardiovascular;  Laterality: Bilateral;   LEFT HEART CATH AND CORONARY ANGIOGRAPHY N/A 10/16/2018   Procedure: LEFT HEART CATH AND CORONARY ANGIOGRAPHY;  Surgeon: Verlin Lonni BIRCH, MD;  Location: [facility] INVASIVE CV LAB;  Service: Cardiovascular;  Laterality: N/A;   LEFT HEART CATH AND CORONARY ANGIOGRAPHY  N/A 03/11/2019   Procedure: LEFT HEART CATH AND CORONARY ANGIOGRAPHY;  Surgeon: Reek Candyce GORMAN, MD;  Location: [facility] INVASIVE CV LAB;  Service: Cardiovascular;  Laterality: N/A;   LEFT HEART CATHETERIZATION WITH CORONARY ANGIOGRAM N/A 09/06/2013   STEMI, 2nd ISR LAD. Procedure: LEFT HEART CATHETERIZATION WITH CORONARY ANGIOGRAM;  Surgeon: Candyce GORMAN Reek, MD;  Location: [facility] CATH LAB;  Service: Cardiovascular;  Laterality: N/A;   LOWER EXTREMITY ANGIOGRAPHY N/A 11/30/2017   Procedure: LOWER EXTREMITY ANGIOGRAPHY;  Surgeon: Court Dorn PARAS, MD;  Location: [facility] INVASIVE CV LAB;  Service: Cardiovascular;  Laterality: N/A;   PERCUTANEOUS CORONARY STENT INTERVENTION (PCI-S)  07/08/2012   Procedure: PERCUTANEOUS CORONARY STENT INTERVENTION (PCI-S);  Surgeon: Candyce GORMAN Reek, MD;  Location: [facility] CATH LAB;  Service: Cardiovascular;;  DES LAD   PERCUTANEOUS CORONARY STENT INTERVENTION (PCI-S) N/A 09/06/2013   Procedure: PERCUTANEOUS CORONARY STENT INTERVENTION (PCI-S);  Surgeon: Candyce GORMAN Reek, MD;  Location: [facility] CATH LAB;  Service: Cardiovascular;  Laterality: N/A;  Mid LAD 3.0/24mm Promus   POLYPECTOMY  11/11/2022   Procedure: POLYPECTOMY INTESTINAL;  Surgeon: Cinderella Deatrice FALCON, MD;  [facility];  Service: Endoscopy;;   SCLEROTHERAPY  11/11/2022   Procedure: MATIAS;  Surgeon: Cinderella Deatrice FALCON, MD;  [facility];  Service: Endoscopy;;   SUBMUCOSAL TATTOO INJECTION  11/11/2022   Procedure: SUBMUCOSAL TATTOO INJECTION;  Surgeon: Cinderella Deatrice FALCON, MD;  [facility];  Service: Endoscopy;;   THORACENTESIS Right 11/10/2021   Procedure: MILANA;  Surgeon: Shelah Lamar GORMAN, MD;  Location: [facility] ENDOSCOPY;  Service: Cardiopulmonary;  Laterality: Right;   VIDEO BRONCHOSCOPY WITH ENDOBRONCHIAL ULTRASOUND Right 11/12/2021   Procedure: VIDEO BRONCHOSCOPY WITH ENDOBRONCHIAL ULTRASOUND;  Surgeon: Gladis Leonor HERO, MD;  Location: [facility] ENDOSCOPY;  Service: Pulmonary;   Laterality: Right;   WOUND DEBRIDEMENT Left 05/20/2019   Procedure: DEBRIDEMENT GROIN;  Surgeon: Kinsinger, Herlene Righter, MD;  Location: [facility] OR;  Service: General;  Laterality: Left;   WRIST FRACTURE SURGERY Bilateral    Family History  Problem Relation Age of Onset   Hypertension Mother    Diabetes Mother    Social History   Socioeconomic History   Marital status: Divorced    Spouse name: Not on file   Number of children: 3   Years of education: 73   Highest education level: 12th grade  Occupational History   Not on file  Tobacco Use   Smoking status: Every Day    Current packs/day: 0.50    Average packs/day: 0.5 packs/day for 48.0 years (24.0 ttl pk-yrs)    Types: Cigarettes    Passive exposure: Current  Smokeless tobacco: Never   Tobacco comments:    Smoking Cessation Classes, Agencies, Services & Resources Offered.  Vaping Use   Vaping status: Never Used  Substance and Sexual Activity   Alcohol  use: Not Currently    Comment: 11/30/2017 might drink a beer q 6 months   Drug use: Not Currently    Types: Cocaine   Sexual activity: Not Currently    Partners: Female  Other Topics Concern   Not on file  Social History Narrative   ** Merged History Encounter **       Social Drivers of Health   Financial Resource Strain: Low Risk  (12/14/2022)   Overall Financial Resource Strain (CARDIA)    Difficulty of Paying Living Expenses: Not hard at all  Food Insecurity: No Food Insecurity (12/19/2022)   Hunger Vital Sign    Worried About Running Out of Food in the Last Year: Never true    Ran Out of Food in the Last Year: Never true  Transportation Needs: No Transportation Needs (12/27/2022)   PRAPARE - Administrator, Civil Service (Medical): No    Lack of Transportation (Non-Medical): No  Physical Activity: Inactive (12/14/2022)   Exercise Vital Sign    Days of Exercise per Week: 0 days    Minutes of Exercise per Session: 0 min  Stress: Stress Concern Present  (12/14/2022)   [employer] of Occupational Health - Occupational Stress Questionnaire    Feeling of Stress : To some extent  Social Connections: Moderately Integrated (12/14/2022)   Social Connection and Isolation Panel [NHANES]    Frequency of Communication with Friends and Family: More than three times a week    Frequency of Social Gatherings with Friends and Family: More than three times a week    Attends Religious Services: More than 4 times per year    Active Member of [employer] or Organizations: Yes    Attends Banker Meetings: Never    Marital Status: Divorced  Catering Manager Violence: Not At Risk (12/19/2022)   Humiliation, Afraid, Rape, and Kick questionnaire    Fear of Current or Ex-Partner: No    Emotionally Abused: No    Physically Abused: No    Sexually Abused: No    Review of Systems Constitutional: Patient denies any unintentional weight loss or change in strength lntegumentary: Patient denies any rashes or pruritus Cardiovascular: Patient denies chest pain or syncope Respiratory: Patient denies shortness of breath Gastrointestinal: Patient ***denies nausea, vomiting, constipation, or diarrhea Musculoskeletal: Patient denies muscle cramps or weakness Neurologic: Patient denies convulsions or seizures Allergic/Immunologic: Patient denies recent allergic reaction(s) Hematologic/Lymphatic: Patient denies bleeding tendencies Endocrine: Patient denies heat/cold intolerance  GU: As per HPI.  OBJECTIVE There were no vitals filed for this visit. There is no height or weight on file to calculate BMI.  Physical Examination Constitutional: No obvious distress; patient is non-toxic appearing  Cardiovascular: No visible lower extremity edema.  Respiratory: The patient does not have audible wheezing/stridor; respirations do not appear labored  Gastrointestinal: Abdomen non-distended Musculoskeletal: Normal ROM of UEs  Skin: No obvious rashes/open sores   Neurologic: CN 2-12 grossly intact Psychiatric: Answered questions appropriately with normal affect  Hematologic/Lymphatic/Immunologic: No obvious bruises or sites of spontaneous bleeding  Urine microscopy: ***negative *** WBC/hpf, *** RBC/hpf, *** bacteria UA: ***negative *** WBC/hpf, *** RBC/hpf, *** bacteria ***with no evidence of UTI ***with no evidence of microscopic hematuria ***otherwise unremarkable  PVR: *** ml  ASSESSMENT Scrotal pain; chronic, bilateral ***  Will plan for  follow up in *** months / ***1 year or sooner if needed. Pt verbalized understanding and agreement. All questions were answered.  PLAN Advised the following: 1. *** 2. ***No follow-ups on file.  No orders of the defined types were placed in this encounter.   It has been explained that the patient is to follow regularly with their PCP in addition to all other providers involved in their care and to follow instructions provided by these respective offices. Patient advised to contact urology clinic if any urologic-pertaining questions, concerns, new symptoms or problems arise in the interim period.  There are no Patient Instructions on file for this visit.  Electronically signed by:  Lauraine JAYSON Oz, FNP   01/13/23    10:46 AM

## 2023-01-14 ENCOUNTER — Other Ambulatory Visit: Payer: Self-pay

## 2023-01-14 ENCOUNTER — Encounter (HOSPITAL_COMMUNITY): Payer: Self-pay | Admitting: *Deleted

## 2023-01-14 ENCOUNTER — Emergency Department (HOSPITAL_COMMUNITY): Payer: No Typology Code available for payment source

## 2023-01-14 ENCOUNTER — Inpatient Hospital Stay (HOSPITAL_COMMUNITY)
Admission: EM | Admit: 2023-01-14 | Discharge: 2023-01-17 | DRG: 291 | Disposition: A | Discharge status: Skilled Nursing Facility | Payer: No Typology Code available for payment source | Attending: Internal Medicine | Admitting: Internal Medicine

## 2023-01-14 DIAGNOSIS — Z955 Presence of coronary angioplasty implant and graft: Secondary | ICD-10-CM

## 2023-01-14 DIAGNOSIS — Z8619 Personal history of other infectious and parasitic diseases: Secondary | ICD-10-CM

## 2023-01-14 DIAGNOSIS — Z6832 Body mass index (BMI) 32.0-32.9, adult: Secondary | ICD-10-CM

## 2023-01-14 DIAGNOSIS — I639 Cerebral infarction, unspecified: Secondary | ICD-10-CM | POA: Diagnosis not present

## 2023-01-14 DIAGNOSIS — Z833 Family history of diabetes mellitus: Secondary | ICD-10-CM

## 2023-01-14 DIAGNOSIS — Z8673 Personal history of transient ischemic attack (TIA), and cerebral infarction without residual deficits: Secondary | ICD-10-CM | POA: Diagnosis not present

## 2023-01-14 DIAGNOSIS — Z87891 Personal history of nicotine dependence: Secondary | ICD-10-CM

## 2023-01-14 DIAGNOSIS — E1140 Type 2 diabetes mellitus with diabetic neuropathy, unspecified: Secondary | ICD-10-CM | POA: Diagnosis present

## 2023-01-14 DIAGNOSIS — Z8701 Personal history of pneumonia (recurrent): Secondary | ICD-10-CM | POA: Diagnosis not present

## 2023-01-14 DIAGNOSIS — R296 Repeated falls: Secondary | ICD-10-CM

## 2023-01-14 DIAGNOSIS — Z79899 Other long term (current) drug therapy: Secondary | ICD-10-CM | POA: Diagnosis not present

## 2023-01-14 DIAGNOSIS — G9341 Metabolic encephalopathy: Secondary | ICD-10-CM | POA: Diagnosis present

## 2023-01-14 DIAGNOSIS — E1122 Type 2 diabetes mellitus with diabetic chronic kidney disease: Secondary | ICD-10-CM | POA: Diagnosis present

## 2023-01-14 DIAGNOSIS — I509 Heart failure, unspecified: Principal | ICD-10-CM

## 2023-01-14 DIAGNOSIS — I255 Ischemic cardiomyopathy: Secondary | ICD-10-CM | POA: Diagnosis present

## 2023-01-14 DIAGNOSIS — Z7982 Long term (current) use of aspirin: Secondary | ICD-10-CM

## 2023-01-14 DIAGNOSIS — N179 Acute kidney failure, unspecified: Secondary | ICD-10-CM | POA: Diagnosis present

## 2023-01-14 DIAGNOSIS — M545 Low back pain, unspecified: Secondary | ICD-10-CM | POA: Diagnosis present

## 2023-01-14 DIAGNOSIS — M7989 Other specified soft tissue disorders: Secondary | ICD-10-CM | POA: Diagnosis present

## 2023-01-14 DIAGNOSIS — I130 Hypertensive heart and chronic kidney disease with heart failure and stage 1 through stage 4 chronic kidney disease, or unspecified chronic kidney disease: Principal | ICD-10-CM | POA: Diagnosis present

## 2023-01-14 DIAGNOSIS — Z931 Gastrostomy status: Secondary | ICD-10-CM | POA: Diagnosis not present

## 2023-01-14 DIAGNOSIS — I10 Essential (primary) hypertension: Secondary | ICD-10-CM | POA: Diagnosis not present

## 2023-01-14 DIAGNOSIS — T43595A Adverse effect of other antipsychotics and neuroleptics, initial encounter: Secondary | ICD-10-CM | POA: Diagnosis not present

## 2023-01-14 DIAGNOSIS — E785 Hyperlipidemia, unspecified: Secondary | ICD-10-CM | POA: Diagnosis present

## 2023-01-14 DIAGNOSIS — D631 Anemia in chronic kidney disease: Secondary | ICD-10-CM | POA: Diagnosis present

## 2023-01-14 DIAGNOSIS — E1149 Type 2 diabetes mellitus with other diabetic neurological complication: Secondary | ICD-10-CM | POA: Diagnosis present

## 2023-01-14 DIAGNOSIS — Z7984 Long term (current) use of oral hypoglycemic drugs: Secondary | ICD-10-CM

## 2023-01-14 DIAGNOSIS — Z794 Long term (current) use of insulin: Secondary | ICD-10-CM | POA: Diagnosis not present

## 2023-01-14 DIAGNOSIS — I5043 Acute on chronic combined systolic (congestive) and diastolic (congestive) heart failure: Secondary | ICD-10-CM | POA: Diagnosis present

## 2023-01-14 DIAGNOSIS — N1831 Chronic kidney disease, stage 3a: Secondary | ICD-10-CM | POA: Diagnosis present

## 2023-01-14 DIAGNOSIS — E66811 Obesity, class 1: Secondary | ICD-10-CM | POA: Diagnosis present

## 2023-01-14 DIAGNOSIS — Z8249 Family history of ischemic heart disease and other diseases of the circulatory system: Secondary | ICD-10-CM

## 2023-01-14 DIAGNOSIS — N189 Chronic kidney disease, unspecified: Secondary | ICD-10-CM | POA: Diagnosis not present

## 2023-01-14 DIAGNOSIS — G4730 Sleep apnea, unspecified: Secondary | ICD-10-CM | POA: Diagnosis present

## 2023-01-14 DIAGNOSIS — I2510 Atherosclerotic heart disease of native coronary artery without angina pectoris: Secondary | ICD-10-CM | POA: Diagnosis present

## 2023-01-14 DIAGNOSIS — K219 Gastro-esophageal reflux disease without esophagitis: Secondary | ICD-10-CM | POA: Diagnosis present

## 2023-01-14 DIAGNOSIS — F1410 Cocaine abuse, uncomplicated: Secondary | ICD-10-CM | POA: Diagnosis present

## 2023-01-14 DIAGNOSIS — I252 Old myocardial infarction: Secondary | ICD-10-CM

## 2023-01-14 DIAGNOSIS — Z888 Allergy status to other drugs, medicaments and biological substances status: Secondary | ICD-10-CM

## 2023-01-14 DIAGNOSIS — R4781 Slurred speech: Secondary | ICD-10-CM | POA: Diagnosis not present

## 2023-01-14 DIAGNOSIS — G8929 Other chronic pain: Secondary | ICD-10-CM | POA: Diagnosis present

## 2023-01-14 LAB — RAPID URINE DRUG SCREEN, HOSP PERFORMED
Amphetamines: NOT DETECTED
Barbiturates: NOT DETECTED
Benzodiazepines: NOT DETECTED
Cocaine: NOT DETECTED
Opiates: POSITIVE — AB
Tetrahydrocannabinol: NOT DETECTED

## 2023-01-14 LAB — TROPONIN I (HIGH SENSITIVITY)
Troponin I (High Sensitivity): 15 ng/L (ref <18)
Troponin I (High Sensitivity): 14 ng/L (ref <18)

## 2023-01-14 LAB — BASIC METABOLIC PANEL
Anion gap: 8 (ref 5–15)
BUN: 31 mg/dL — ABNORMAL HIGH (ref 8–23)
CO2: 22 mmol/L (ref 22–32)
Calcium: 8.4 mg/dL — ABNORMAL LOW (ref 8.9–10.3)
Chloride: 112 mmol/L — ABNORMAL HIGH (ref 98–111)
Creatinine, Ser: 1.92 mg/dL — ABNORMAL HIGH (ref 0.61–1.24)
GFR, Estimated: 37 mL/min — ABNORMAL LOW (ref >60)
Glucose, Bld: 158 mg/dL — ABNORMAL HIGH (ref 70–99)
Potassium: 4.3 mmol/L (ref 3.5–5.1)
Sodium: 142 mmol/L (ref 135–145)

## 2023-01-14 LAB — GLUCOSE, CAPILLARY: Glucose-Capillary: 150 mg/dL — ABNORMAL HIGH (ref 70–99)

## 2023-01-14 LAB — CBC
HCT: 29.9 % — ABNORMAL LOW (ref 39.0–52.0)
Hemoglobin: 7.9 g/dL — ABNORMAL LOW (ref 13.0–17.0)
MCH: 21.1 pg — ABNORMAL LOW (ref 26.0–34.0)
MCHC: 26.4 g/dL — ABNORMAL LOW (ref 30.0–36.0)
MCV: 79.9 fL — ABNORMAL LOW (ref 80.0–100.0)
Platelets: 329 10*3/uL (ref 150–400)
RBC: 3.74 MIL/uL — ABNORMAL LOW (ref 4.22–5.81)
RDW: 23.7 % — ABNORMAL HIGH (ref 11.5–15.5)
WBC: 12.1 10*3/uL — ABNORMAL HIGH (ref 4.0–10.5)
nRBC: 0.8 % — ABNORMAL HIGH (ref 0.0–0.2)

## 2023-01-14 LAB — BRAIN NATRIURETIC PEPTIDE: B Natriuretic Peptide: 1193 pg/mL — ABNORMAL HIGH (ref 0.0–100.0)

## 2023-01-14 LAB — MAGNESIUM: Magnesium: 2.2 mg/dL (ref 1.7–2.4)

## 2023-01-14 MED ORDER — POLYETHYLENE GLYCOL 3350 17 G PO PACK: 17.0000 g | PACK | Freq: Every day | ORAL | Status: DC | PRN

## 2023-01-14 MED ORDER — POTASSIUM CHLORIDE CRYS ER 20 MEQ PO TBCR
40.0000 meq | EXTENDED_RELEASE_TABLET | Freq: Once | ORAL | Status: AC
  Administered 2023-01-14: 40 meq via ORAL
  Filled 2023-01-14: qty 2

## 2023-01-14 MED ORDER — ACETAMINOPHEN 650 MG RE SUPP: 650.0000 mg | Freq: Four times a day (QID) | RECTAL | Status: DC | PRN

## 2023-01-14 MED ORDER — ATORVASTATIN CALCIUM 40 MG PO TABS
40.0000 mg | ORAL_TABLET | Freq: Every day | ORAL | Status: DC
  Administered 2023-01-14 – 2023-01-16 (×3): 40 mg via ORAL
  Filled 2023-01-14 (×3): qty 1

## 2023-01-14 MED ORDER — INSULIN ASPART 100 UNIT/ML IJ SOLN: 0.0000 [IU] | Freq: Every day | INTRAMUSCULAR | Status: DC

## 2023-01-14 MED ORDER — FUROSEMIDE 10 MG/ML IJ SOLN
40.0000 mg | Freq: Once | INTRAMUSCULAR | Status: AC
  Administered 2023-01-14: 40 mg via INTRAVENOUS
  Filled 2023-01-14: qty 4

## 2023-01-14 MED ORDER — INSULIN ASPART 100 UNIT/ML IJ SOLN
0.0000 [IU] | Freq: Three times a day (TID) | INTRAMUSCULAR | Status: DC
  Administered 2023-01-15: 1 [IU] via SUBCUTANEOUS
  Administered 2023-01-15: 3 [IU] via SUBCUTANEOUS
  Administered 2023-01-15: 1 [IU] via SUBCUTANEOUS
  Administered 2023-01-16 (×2): 2 [IU] via SUBCUTANEOUS
  Administered 2023-01-17: 3 [IU] via SUBCUTANEOUS
  Administered 2023-01-17: 1 [IU] via SUBCUTANEOUS

## 2023-01-14 MED ORDER — MIDODRINE HCL 5 MG PO TABS
10.0000 mg | ORAL_TABLET | Freq: Three times a day (TID) | ORAL | Status: DC
  Administered 2023-01-14 – 2023-01-17 (×9): 10 mg via ORAL
  Filled 2023-01-14 (×9): qty 2

## 2023-01-14 MED ORDER — ACETAMINOPHEN 325 MG PO TABS
650.0000 mg | ORAL_TABLET | Freq: Four times a day (QID) | ORAL | Status: DC | PRN
  Administered 2023-01-16 (×3): 650 mg via ORAL
  Filled 2023-01-14 (×3): qty 2

## 2023-01-14 MED ORDER — LINACLOTIDE 145 MCG PO CAPS
145.0000 ug | ORAL_CAPSULE | Freq: Every day | ORAL | Status: DC
  Administered 2023-01-15 – 2023-01-17 (×3): 145 ug via ORAL
  Filled 2023-01-14 (×3): qty 1

## 2023-01-14 MED ORDER — FUROSEMIDE 10 MG/ML IJ SOLN
40.0000 mg | Freq: Two times a day (BID) | INTRAMUSCULAR | Status: DC
  Administered 2023-01-15 – 2023-01-16 (×4): 40 mg via INTRAVENOUS
  Filled 2023-01-14 (×5): qty 4

## 2023-01-14 MED ORDER — MORPHINE SULFATE (PF) 4 MG/ML IV SOLN
4.0000 mg | Freq: Once | INTRAVENOUS | Status: AC
  Administered 2023-01-14: 4 mg via INTRAVENOUS
  Filled 2023-01-14: qty 1

## 2023-01-14 MED ORDER — ASPIRIN 81 MG PO TBEC
81.0000 mg | DELAYED_RELEASE_TABLET | Freq: Every day | ORAL | Status: DC
  Administered 2023-01-15 – 2023-01-17 (×3): 81 mg via ORAL
  Filled 2023-01-14 (×3): qty 1

## 2023-01-14 MED ORDER — HYDROCODONE-ACETAMINOPHEN 5-325 MG PO TABS
1.0000 | ORAL_TABLET | Freq: Four times a day (QID) | ORAL | Status: DC | PRN
  Filled 2023-01-14: qty 1

## 2023-01-14 MED ORDER — GABAPENTIN 300 MG PO CAPS
300.0000 mg | ORAL_CAPSULE | Freq: Three times a day (TID) | ORAL | Status: DC
  Administered 2023-01-14: 300 mg via ORAL
  Filled 2023-01-14: qty 1

## 2023-01-14 MED ORDER — ONDANSETRON HCL 4 MG PO TABS: 4.0000 mg | ORAL_TABLET | Freq: Four times a day (QID) | ORAL | Status: DC | PRN

## 2023-01-14 MED ORDER — ONDANSETRON HCL 4 MG/2ML IJ SOLN: 4.0000 mg | Freq: Four times a day (QID) | INTRAMUSCULAR | Status: DC | PRN

## 2023-01-14 MED ORDER — HEPARIN SODIUM (PORCINE) 5000 UNIT/ML IJ SOLN
5000.0000 [IU] | Freq: Three times a day (TID) | INTRAMUSCULAR | Status: DC
  Administered 2023-01-14 – 2023-01-16 (×5): 5000 [IU] via SUBCUTANEOUS
  Filled 2023-01-14 (×5): qty 1

## 2023-01-14 MED ORDER — PANTOPRAZOLE SODIUM 40 MG PO TBEC
40.0000 mg | DELAYED_RELEASE_TABLET | Freq: Every day | ORAL | Status: DC
  Administered 2023-01-14 – 2023-01-17 (×4): 40 mg via ORAL
  Filled 2023-01-14 (×4): qty 1

## 2023-01-14 NOTE — Assessment & Plan Note (Addendum)
 Controlled.  A1c 6.3. - SSI- S -Hold Jardiance,  Lantus 5 units -Resume gabapentin

## 2023-01-14 NOTE — Assessment & Plan Note (Signed)
 AKI on CKD stage IIIa.  Creatinine 1.9, recent baseline 1.3-1.4.  In the setting of decompensated CHF. -Monitor closely with diuresis

## 2023-01-14 NOTE — Progress Notes (Addendum)
 Pt A&O x 4. Pt refused any more labs to be drawn after tonight and stated he does not want to continue to be stuck. Pt has requested a PICC line. Pt's daughter called and expressed concern about this issue and is also requesting a PICC line to be placed, she stated this was discussed the last time he was admitted here.

## 2023-01-14 NOTE — ED Notes (Signed)
 Pt asked for something to drink, explained to pt he is NPO until the EDP has seen him.

## 2023-01-14 NOTE — Assessment & Plan Note (Signed)
 Daughter reports frequent falls.  Requesting placement. - PT , TOC consult

## 2023-01-14 NOTE — H&P (Signed)
 History and Physical    [name] FMW:[id number] DOB: [birth year] DOA: 01/14/2023  PCP: Carlette Benita Area, MD   Patient coming from: Home  I have personally briefly reviewed patient's old medical records in [facility]  Chief Complaint: Leg swelling   HPI: [name] is a 68 y.o. male with medical history significant for congestive heart failure, coronary artery disease, diabetes mellitus, hypertension, stroke, LV thrombus, cocaine abuse. Patient presented to the ED with complaints of bilateral lower extremity swelling this has been going on for several months, he reports last night swelling got worse, with pain involving the heels of both of his legs. He denies chest pain, no difficulty breathing, no cough.  He is not on home O2.  He reports compliance with his torsemide  20 mg daily. [name] called also saying patient had had multiple falls, and would like placement.  Recently hospitalized 12/8 to 12/14 for severe sepsis, with bacteremia, secondary to right abscess buttock/perirectal abscess status post I&D at bedside by general surgery.  ED Course: Temperature 98.7.  Heart rate 90s.  Respirate rate 18.  Blood pressure systolic 106 -140s.  O2 sats recorded at 69% on room air, on my evaluation patient is on room air sats 94 to 96%.   BNP elevated 1193.  Creatinine elevated 1.9.  WBC 12.1. Chest x-ray with enlarged cardiac silhouette, vascular congestion, x-rays of bilateral lower extremity-show swelling. Lasix  40 mg x 1 given.  Review of Systems: As per HPI all other systems reviewed and negative.  Past Medical History:  Diagnosis Date   Bulging lumbar disc    CAD (coronary artery disease) [phone]   a. prior LAD stenting. b. s/p DES to [facility] 08/2015. c. 04/2016 Cardiac cath at [facility]. Patent stent in the PLAD and RCA. Diffuse dLAD, OM2, and  RPDA disease. d. DES to PDA and distal RCA 03/2019    Chronic lower back pain    CKD (chronic kidney  disease), stage II    Cocaine abuse (HCC)    Cyst of epididymis    DM2 (diabetes mellitus, type 2) (HCC)    Essential hypertension    Fournier gangrene    GERD (gastroesophageal reflux disease)    Headache    History of pneumonia    Hyperlipidemia    Ischemic cardiomyopathy    LV (left ventricular) mural thrombus    Sleep apnea     Past Surgical History:  Procedure Laterality Date   APPENDECTOMY     BIOPSY  11/11/2022   Procedure: BIOPSY;  Surgeon: Cinderella Deatrice FALCON, MD;  [facility];  Service: Endoscopy;;   BRONCHIAL NEEDLE ASPIRATION BIOPSY  11/12/2021   Procedure: BRONCHIAL NEEDLE ASPIRATION BIOPSIES;  Surgeon: Gladis Leonor CHRISTELLA, MD;  Location: [facility] ENDOSCOPY;  Service: Pulmonary;;   CARDIAC CATHETERIZATION N/A 09/07/2015   Procedure: Left Heart Cath and Coronary Angiography;  Surgeon: Alm LELON Clay, MD;  Location: [facility] INVASIVE CV LAB;  Service: Cardiovascular;  Laterality: N/A;   CARDIAC CATHETERIZATION N/A 09/07/2015   Procedure: Coronary Stent Intervention;  Surgeon: Alm LELON Clay, MD;  Location: [facility] INVASIVE CV LAB;  Service: Cardiovascular;  Laterality: N/A;   COLONOSCOPY WITH PROPOFOL  N/A 11/11/2022   Procedure: COLONOSCOPY WITH PROPOFOL ;  Surgeon: Cinderella Deatrice FALCON, MD;  [facility]: [facility];  Service: Endoscopy;  Laterality: N/A;   CORONARY ANGIOGRAM  09/07/13   residual RCA and OM disease   CORONARY ANGIOPLASTY WITH STENT PLACEMENT     CORONARY STENT INTERVENTION N/A  10/16/2018   Procedure: CORONARY STENT INTERVENTION;  Surgeon: Verlin Lonni BIRCH, MD;  Location: [facility] INVASIVE CV LAB;  Service: Cardiovascular;  Laterality: N/A;   CORONARY STENT INTERVENTION N/A 03/11/2019   Procedure: CORONARY STENT INTERVENTION;  Surgeon: Dann Candyce RAMAN, MD;  Location: [facility] INVASIVE CV LAB;  Service: Cardiovascular;  Laterality: N/A;   ESOPHAGOGASTRODUODENOSCOPY (EGD) WITH PROPOFOL  N/A 11/11/2022   Procedure: ESOPHAGOGASTRODUODENOSCOPY (EGD) WITH PROPOFOL ;  Surgeon:  Cinderella Deatrice FALCON, MD;  [facility];  Service: Endoscopy;  Laterality: N/A;   FRACTURE SURGERY     HEMOSTASIS CLIP PLACEMENT  11/11/2022   Procedure: HEMOSTASIS CLIP PLACEMENT;  Surgeon: Cinderella Deatrice FALCON, MD;  [facility];  Service: Endoscopy;;   INCISION AND DRAINAGE OF WOUND Left 05/19/2019   Procedure: DEBRIDEMENT LEFT GROIN;  Surgeon: Sebastian Moles, MD;  Location: [facility] OR;  Service: General;  Laterality: Left;   INSERTION OF ILIAC STENT Left 11/30/2017   Left external illiac stent   INSERTION OF ILIAC STENT  11/30/2017   Procedure: Insertion Of Iliac Stent;  Surgeon: Court Dorn PARAS, MD;  Location: [facility] INVASIVE CV LAB;  Service: Cardiovascular;;  Left external illiac stent   KNEE ARTHROSCOPY Left    KNEE SURGERY     ligaments, cartilage; tendon, put a pin in (11/30/2017)   LEFT HEART CATH Bilateral 07/08/2012   Procedure: LEFT HEART CATH;  Surgeon: Candyce RAMAN Dann, MD;  Location: [facility] CATH LAB;  Service: Cardiovascular;  Laterality: Bilateral;   LEFT HEART CATH AND CORONARY ANGIOGRAPHY N/A 10/16/2018   Procedure: LEFT HEART CATH AND CORONARY ANGIOGRAPHY;  Surgeon: Verlin Lonni BIRCH, MD;  Location: [facility] INVASIVE CV LAB;  Service: Cardiovascular;  Laterality: N/A;   LEFT HEART CATH AND CORONARY ANGIOGRAPHY N/A 03/11/2019   Procedure: LEFT HEART CATH AND CORONARY ANGIOGRAPHY;  Surgeon: Dann Candyce RAMAN, MD;  Location: [facility] INVASIVE CV LAB;  Service: Cardiovascular;  Laterality: N/A;   LEFT HEART CATHETERIZATION WITH CORONARY ANGIOGRAM N/A 09/06/2013   STEMI, 2nd ISR LAD. Procedure: LEFT HEART CATHETERIZATION WITH CORONARY ANGIOGRAM;  Surgeon: Candyce RAMAN Dann, MD;  Location: [facility] CATH LAB;  Service: Cardiovascular;  Laterality: N/A;   LOWER EXTREMITY ANGIOGRAPHY N/A 11/30/2017   Procedure: LOWER EXTREMITY ANGIOGRAPHY;  Surgeon: Court Dorn PARAS, MD;  Location: [facility] INVASIVE CV LAB;  Service: Cardiovascular;  Laterality: N/A;   PERCUTANEOUS CORONARY STENT  INTERVENTION (PCI-S)  07/08/2012   Procedure: PERCUTANEOUS CORONARY STENT INTERVENTION (PCI-S);  Surgeon: Candyce RAMAN Dann, MD;  Location: [facility] CATH LAB;  Service: Cardiovascular;;  DES LAD   PERCUTANEOUS CORONARY STENT INTERVENTION (PCI-S) N/A 09/06/2013   Procedure: PERCUTANEOUS CORONARY STENT INTERVENTION (PCI-S);  Surgeon: Candyce RAMAN Dann, MD;  Location: [facility] CATH LAB;  Service: Cardiovascular;  Laterality: N/A;  Mid LAD 3.0/24mm Promus   POLYPECTOMY  11/11/2022   Procedure: POLYPECTOMY INTESTINAL;  Surgeon: Cinderella Deatrice FALCON, MD;  [facility];  Service: Endoscopy;;   SCLEROTHERAPY  11/11/2022   Procedure: MATIAS;  Surgeon: Cinderella Deatrice FALCON, MD;  [facility];  Service: Endoscopy;;   SUBMUCOSAL TATTOO INJECTION  11/11/2022   Procedure: SUBMUCOSAL TATTOO INJECTION;  Surgeon: Cinderella Deatrice FALCON, MD;  [facility];  Service: Endoscopy;;   THORACENTESIS Right 11/10/2021   Procedure: MILANA;  Surgeon: Shelah Lamar RAMAN, MD;  Location: [facility] ENDOSCOPY;  Service: Cardiopulmonary;  Laterality: Right;   VIDEO BRONCHOSCOPY WITH ENDOBRONCHIAL ULTRASOUND Right 11/12/2021   Procedure: VIDEO BRONCHOSCOPY WITH ENDOBRONCHIAL ULTRASOUND;  Surgeon: Gladis Leonor HERO, MD;  Location: [facility] ENDOSCOPY;  Service:  Pulmonary;  Laterality: Right;   WOUND DEBRIDEMENT Left 05/20/2019   Procedure: DEBRIDEMENT GROIN;  Surgeon: Kinsinger, Herlene Righter, MD;  Location: [facility] OR;  Service: General;  Laterality: Left;   WRIST FRACTURE SURGERY Bilateral      reports that he has been smoking cigarettes. He has a 24 pack-year smoking history. He has been exposed to tobacco smoke. He has never used smokeless tobacco. He reports that he does not currently use alcohol . He reports that he does not currently use drugs after having used the following drugs: Cocaine.  Allergies  Allergen Reactions   Clopidogrel  Other (See Comments)    Drowsy, Skin irritation    Family History  Problem Relation Age  of Onset   Hypertension Mother    Diabetes Mother     Prior to Admission medications   Medication Sig Start Date End Date Taking? Authorizing Provider  aspirin  EC (ASPIRIN  LOW DOSE) 81 MG tablet Take 1 tablet (81 mg total) by mouth daily. Swallow whole. 11/14/22  Yes Tat, Alm, MD  atorvastatin  (LIPITOR ) 40 MG tablet Take 1 tablet (40 mg total) by mouth at bedtime. 10/25/22 01/14/23 Yes Ricky Fines, MD  carvedilol  (COREG ) 3.125 MG tablet Take 1 tablet (3.125 mg total) by mouth 2 (two) times daily with a meal. 10/25/22 01/14/23 Yes Ricky Fines, MD  ferrous sulfate  325 (65 FE) MG tablet Take 1 tablet (325 mg total) by mouth daily with breakfast. 11/14/22  Yes Tat, Alm, MD  folic acid  (FOLVITE ) 1 MG tablet Take 1 tablet (1 mg total) by mouth daily. 11/14/22  Yes Tat, Alm, MD  furosemide  (LASIX ) 40 MG tablet Take 40 mg by mouth daily. 01/02/23  Yes [provider]  gabapentin  (NEURONTIN ) 300 MG capsule Take 1 capsule (300 mg total) by mouth 3 (three) times daily. 10/25/22  Yes Ricky Fines, MD  HYDROcodone -acetaminophen  (NORCO/VICODIN) 5-325 MG tablet Take 1 tablet by mouth every 6 (six) hours as needed for moderate pain (pain score 4-6).   Yes [provider]  insulin  glargine (LANTUS ) 100 UNIT/ML Solostar Pen Inject 5 Units into the skin daily. Patient taking differently: Inject 5 Units into the skin at bedtime. 11/15/22  Yes Tat, Alm, MD  JARDIANCE  10 MG TABS tablet Take 10 mg by mouth daily. 01/02/23  Yes [provider]  linaclotide  (LINZESS ) 145 MCG CAPS capsule Take 1 capsule (145 mcg total) by mouth daily before breakfast. 12/24/22  Yes Tat, Alm, MD  midodrine  (PROAMATINE ) 10 MG tablet Take 1 tablet (10 mg total) by mouth 3 (three) times daily with meals. 12/24/22  Yes Tat, Alm, MD  pantoprazole  (PROTONIX ) 40 MG tablet Take 1 tablet (40 mg total) by mouth daily. 10/26/22  Yes Ricky Fines, MD  torsemide  (DEMADEX ) 20 MG tablet Take 1 tablet (20 mg  total) by mouth daily. 11/13/22  Yes TatAlm, MD    Physical Exam: Vitals:   01/14/23 1422 01/14/23 1445 01/14/23 1500 01/14/23 1815  BP:  121/77 132/79 106/61  Pulse:  95 88 95  Resp:  18 18 18   Temp:    98.4 F (36.9 C)  TempSrc:    Oral  SpO2:  94% 96% 96%  Weight: 106.6 kg     Height: 5' 9 (1.753 m)       Constitutional: NAD, calm, comfortable Vitals:   01/14/23 1422 01/14/23 1445 01/14/23 1500 01/14/23 1815  BP:  121/77 132/79 106/61  Pulse:  95 88 95  Resp:  18 18 18   Temp:    98.4  F (36.9 C)  TempSrc:    Oral  SpO2:  94% 96% 96%  Weight: 106.6 kg     Height: 5' 9 (1.753 m)      Eyes: PERRL, lids and conjunctivae normal ENMT: Mucous membranes are moist.   Neck: normal, supple, no masses, no thyromegaly Respiratory: clear to auscultation bilaterally, no wheezing, no crackles. Normal respiratory effort. No accessory muscle use.  Cardiovascular: Regular rate and rhythm, no murmurs / rubs / gallops.  2+ pitting bilateral lower extremity edema to knees extremities warm, no erythema. Abdomen: no tenderness, no masses palpated. No hepatosplenomegaly. Bowel sounds positive.  Musculoskeletal: no clubbing / cyanosis. No joint deformity upper and lower extremities.  Skin: Dry skin, to heels bilateral lower extremity, no open wounds or ulcers, No induration Neurologic: No facial asymmetry, moving extremities spontaneously, speech fluent.  Psychiatric: Normal judgment and insight. Alert and oriented x 3. Normal mood.   Labs on Admission: I have personally reviewed following labs and imaging studies  CBC: Recent Labs  Lab 01/14/23 1422  WBC 12.1*  HGB 7.9*  HCT 29.9*  MCV 79.9*  PLT 329   Basic Metabolic Panel: Recent Labs  Lab 01/14/23 1422  NA 142  K 4.3  CL 112*  CO2 22  GLUCOSE 158*  BUN 31*  CREATININE 1.92*  CALCIUM  8.4*  MG 2.2   Urine analysis:    Component Value Date/Time   COLORURINE YELLOW 12/19/2022 0738   APPEARANCEUR CLEAR 12/19/2022  0738   APPEARANCEUR Clear 08/02/2021 1051   LABSPEC 1.014 12/19/2022 0738   PHURINE 6.0 12/19/2022 0738   GLUCOSEU >=500 (A) 12/19/2022 0738   HGBUR NEGATIVE 12/19/2022 0738   BILIRUBINUR NEGATIVE 12/19/2022 0738   BILIRUBINUR Negative 08/02/2021 1051   KETONESUR NEGATIVE 12/19/2022 0738   PROTEINUR 30 (A) 12/19/2022 0738   UROBILINOGEN 0.2 09/27/2014 2237   NITRITE NEGATIVE 12/19/2022 0738   LEUKOCYTESUR NEGATIVE 12/19/2022 0738    Radiological Exams on Admission: DG Foot Complete Left Result Date: 01/14/2023 CLINICAL DATA:  Bilateral foot pain and swelling. EXAM: LEFT FOOT - COMPLETE 3+ VIEW COMPARISON:  None Available. FINDINGS: The mineralization and alignment are normal. There is no evidence of acute fracture or dislocation. The joint spaces are preserved. There is mild spurring along the medial base of the 1st proximal phalanx. There is dorsal forefoot soft tissue swelling without foreign body or soft tissue emphysema. Prominent vascular calcifications are noted, suggesting underlying diabetes. IMPRESSION: Dorsal forefoot soft tissue swelling without evidence of acute osseous findings or foreign body. Prominent vascular calcifications suggesting underlying diabetes. Electronically Signed   By: Elsie Perone M.D.   On: 01/14/2023 15:21   DG Foot Complete Right Result Date: 01/14/2023 CLINICAL DATA:  Foot swelling and pain EXAM: RIGHT FOOT COMPLETE - 3+ VIEW COMPARISON:  None Available. FINDINGS: There is diffuse soft tissue swelling of the foot, particularly over the dorsum of the foot. There is no acute fracture or dislocation. No cortical erosions are seen. Peripheral vascular calcifications are present. Joint spaces are maintained. IMPRESSION: Diffuse soft tissue swelling of the foot, particularly over the dorsum of the foot. No acute fracture or dislocation. Electronically Signed   By: Greig Pique M.D.   On: 01/14/2023 15:18   DG Chest Portable 1 View Result Date: 01/14/2023 CLINICAL  DATA:  Pain and swelling. EXAM: PORTABLE CHEST 1 VIEW COMPARISON:  12/18/2022. FINDINGS: Underinflation. Enlarged cardiopericardial silhouette with widened mediastinum. Vascular congestion. Small right pleural effusion. No pneumothorax. Film is under penetrated. Overlapping cardiac leads. Lordotic  x-ray. IMPRESSION: Enlarged cardiopericardial silhouette with vascular congestion. Small right pleural effusion. Under penetrated radiograph Electronically Signed   By: Ranell Bring M.D.   On: 01/14/2023 15:17    EKG: Independently reviewed.  Sinus rhythm, rate 70, QTc 497.  No significant change from prior.  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  BNP elevated at 1193. With AKI on CKD.  Reports compliance with torsemide  20 mg daily.  Recent echo 12/2022 EF 30 to 35%. -IV Lasix  40 mg twice daily -Checked input output, daily weight, daily BMP - Trops x 2  Acute kidney injury superimposed on chronic kidney disease (HCC) AKI on CKD stage IIIa.  Creatinine 1.9, recent baseline 1.3-1.4.  In the setting of decompensated CHF. -Monitor closely with diuresis  Frequent falls Daughter reports frequent falls.  Requesting placement. - PT , TOC consult  Coronary artery disease No chest pain.  EKG unchanged. -Check troponins in setting of CHF  Essential hypertension Blood pressure now soft.  On carvedilol  and midodrine . 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  A1c 6.3. - SSI- S -Hold Jardiance ,  Lantus  5 units   Cocaine abuse (HCC) - uds   DVT prophylaxis: heparin  Code Status:  FULL code-confirmed with patient at bedside Family Communication: None at bedside Disposition Plan: ~/> 2 days Consults called: None  Admission status: Inpt Tele  I certify that at the point of admission it is my clinical judgment that the patient will require inpatient hospital care spanning beyond 2 midnights from the point of admission due to high intensity of service, high risk for further deterioration and high frequency of surveillance required.   Author: Tully FORBES Carwin, MD 01/14/2023 7:42 PM  For on call review [email or web address].

## 2023-01-14 NOTE — Assessment & Plan Note (Signed)
 uds

## 2023-01-14 NOTE — Assessment & Plan Note (Addendum)
 2+ pitting bilateral lower extremity edema, chest x-ray with vascular congestion, currently on room air sats greater than 94%.  BNP elevated at 1193. With AKI on CKD.  Per med list he is on torsemide -20 mg daily and Lasix -40 mg daily, he reports compliance with his diuretics.   Recent echo 12/2022 EF 30 to 35%. -IV Lasix  40 mg twice daily -Checked input output, daily weight, daily BMP - Trops x 2

## 2023-01-14 NOTE — ED Notes (Addendum)
 Daughter called PTA and is requesting a SW consult for placement. She reports pt is not safe at home alone and has had multiple falls recently. She also reports she is currently working with a SW to gain guardianship over pt.

## 2023-01-14 NOTE — Assessment & Plan Note (Signed)
 No chest pain.  EKG unchanged. -Check troponins in setting of CHF

## 2023-01-14 NOTE — ED Provider Notes (Signed)
 [place] EMERGENCY DEPARTMENT AT [facility] Provider Note   CSN: [id number] Arrival date & time: 01/14/23  1200     History  Chief Complaint  Patient presents with   Foot Swelling    [name] is a 68 y.o. male.  HPI   Patient has a history of hyperlipidemia, coronary artery disease, chronic kidney disease, cocaine use, schema cardiomyopathy, and acid reflux, chronic back pain.  Patient presents ED with complaints of bilateral feet swelling and pain.  Patient states he feels like someone shot him in his feet.  Patient tells me the symptoms have been going on for months.  [name] also called asking for social work consult for possible placement.  Home Medications Prior to Admission medications   Medication Sig Start Date End Date Taking? Authorizing Provider  aspirin  EC (ASPIRIN  LOW DOSE) 81 MG tablet Take 1 tablet (81 mg total) by mouth daily. Swallow whole. 11/14/22  Yes Tat, Alm, MD  atorvastatin  (LIPITOR ) 40 MG tablet Take 1 tablet (40 mg total) by mouth at bedtime. 10/25/22 01/14/23 Yes Ricky Fines, MD  carvedilol  (COREG ) 3.125 MG tablet Take 1 tablet (3.125 mg total) by mouth 2 (two) times daily with a meal. 10/25/22 01/14/23 Yes Ricky Fines, MD  ferrous sulfate  325 (65 FE) MG tablet Take 1 tablet (325 mg total) by mouth daily with breakfast. 11/14/22  Yes Tat, Alm, MD  folic acid  (FOLVITE ) 1 MG tablet Take 1 tablet (1 mg total) by mouth daily. 11/14/22  Yes Tat, Alm, MD  furosemide  (LASIX ) 40 MG tablet Take 40 mg by mouth daily. 01/02/23  Yes [provider]  gabapentin  (NEURONTIN ) 300 MG capsule Take 1 capsule (300 mg total) by mouth 3 (three) times daily. 10/25/22  Yes Ricky Fines, MD  HYDROcodone -acetaminophen  (NORCO/VICODIN) 5-325 MG tablet Take 1 tablet by mouth every 6 (six) hours as needed for moderate pain (pain score 4-6).   Yes [provider]  insulin  glargine (LANTUS ) 100 UNIT/ML Solostar Pen Inject 5 Units into  the skin daily. Patient taking differently: Inject 5 Units into the skin at bedtime. 11/15/22  Yes Tat, Alm, MD  JARDIANCE  10 MG TABS tablet Take 10 mg by mouth daily. 01/02/23  Yes [provider]  linaclotide  (LINZESS ) 145 MCG CAPS capsule Take 1 capsule (145 mcg total) by mouth daily before breakfast. 12/24/22  Yes Tat, Alm, MD  midodrine  (PROAMATINE ) 10 MG tablet Take 1 tablet (10 mg total) by mouth 3 (three) times daily with meals. 12/24/22  Yes Tat, Alm, MD  pantoprazole  (PROTONIX ) 40 MG tablet Take 1 tablet (40 mg total) by mouth daily. 10/26/22  Yes Ricky Fines, MD  torsemide  (DEMADEX ) 20 MG tablet Take 1 tablet (20 mg total) by mouth daily. 11/13/22  Yes TatAlm, MD      Allergies    Clopidogrel     Review of Systems   Review of Systems  Physical Exam Updated Vital Signs BP 132/79   Pulse 88   Temp 98.7 F (37.1 C) (Oral)   Resp 18   Ht 1.753 m (5' 9)   Wt 106.6 kg   SpO2 96%   BMI 34.70 kg/m  Physical Exam Vitals and nursing note reviewed.  Constitutional:      Appearance: He is well-developed. He is not diaphoretic.  HENT:     Head: Normocephalic and atraumatic.     Right Ear: External ear normal.     Left Ear: External ear normal.  Eyes:  General: No scleral icterus.       Right eye: No discharge.        Left eye: No discharge.     Conjunctiva/sclera: Conjunctivae normal.  Neck:     Trachea: No tracheal deviation.  Cardiovascular:     Rate and Rhythm: Normal rate and regular rhythm.  Pulmonary:     Effort: Pulmonary effort is normal. No respiratory distress.     Breath sounds: Normal breath sounds. No stridor. No wheezing or rales.  Abdominal:     General: Bowel sounds are normal. There is no distension.     Palpations: Abdomen is soft.     Tenderness: There is no abdominal tenderness. There is no guarding or rebound.  Musculoskeletal:        General: Swelling present. No tenderness or deformity.     Cervical back: Neck supple.      Right lower leg: Edema present.     Left lower leg: Edema present.     Comments: Edema noted primarily of the feet, tenderness palpation bilateral feet, no lymphangitic streaking, no cyanosis, no increased warmth  Skin:    General: Skin is warm and dry.     Findings: No rash.  Neurological:     General: No focal deficit present.     Mental Status: He is alert.     Cranial Nerves: No cranial nerve deficit, dysarthria or facial asymmetry.     Sensory: No sensory deficit.     Motor: No abnormal muscle tone or seizure activity.     Coordination: Coordination normal.  Psychiatric:        Mood and Affect: Mood normal.     ED Results / Procedures / Treatments   Labs (all labs ordered are listed, but only abnormal results are displayed) Labs Reviewed  BASIC METABOLIC PANEL - Abnormal; Notable for the following components:      Result Value   Chloride 112 (*)    Glucose, Bld 158 (*)    BUN 31 (*)    Creatinine, Ser 1.92 (*)    Calcium  8.4 (*)    GFR, Estimated 37 (*)    All other components within normal limits  BRAIN NATRIURETIC PEPTIDE - Abnormal; Notable for the following components:   B Natriuretic Peptide 1,193.0 (*)    All other components within normal limits  CBC - Abnormal; Notable for the following components:   WBC 12.1 (*)    RBC 3.74 (*)    Hemoglobin 7.9 (*)    HCT 29.9 (*)    MCV 79.9 (*)    MCH 21.1 (*)    MCHC 26.4 (*)    RDW 23.7 (*)    nRBC 0.8 (*)    All other components within normal limits  MAGNESIUM     EKG EKG Interpretation Date/Time:  Saturday January 14 2023 14:17:34 EST Ventricular Rate:  97 PR Interval:  207 QRS Duration:  80 QT Interval:  391 QTC Calculation: 497 R Axis:   149  Text Interpretation: Sinus rhythm Borderline prolonged PR interval Abnormal lateral Q waves Anterior infarct, old No significant change since last tracing Confirmed by Randol Simmonds [phone]) on 01/14/2023 2:38:13 PM  Radiology DG Foot Complete Left Result Date:  01/14/2023 CLINICAL DATA:  Bilateral foot pain and swelling. EXAM: LEFT FOOT - COMPLETE 3+ VIEW COMPARISON:  None Available. FINDINGS: The mineralization and alignment are normal. There is no evidence of acute fracture or dislocation. The joint spaces are preserved. There is mild spurring along the medial  base of the 1st proximal phalanx. There is dorsal forefoot soft tissue swelling without foreign body or soft tissue emphysema. Prominent vascular calcifications are noted, suggesting underlying diabetes. IMPRESSION: Dorsal forefoot soft tissue swelling without evidence of acute osseous findings or foreign body. Prominent vascular calcifications suggesting underlying diabetes. Electronically Signed   By: Elsie Perone M.D.   On: 01/14/2023 15:21   DG Foot Complete Right Result Date: 01/14/2023 CLINICAL DATA:  Foot swelling and pain EXAM: RIGHT FOOT COMPLETE - 3+ VIEW COMPARISON:  None Available. FINDINGS: There is diffuse soft tissue swelling of the foot, particularly over the dorsum of the foot. There is no acute fracture or dislocation. No cortical erosions are seen. Peripheral vascular calcifications are present. Joint spaces are maintained. IMPRESSION: Diffuse soft tissue swelling of the foot, particularly over the dorsum of the foot. No acute fracture or dislocation. Electronically Signed   By: Greig Pique M.D.   On: 01/14/2023 15:18   DG Chest Portable 1 View Result Date: 01/14/2023 CLINICAL DATA:  Pain and swelling. EXAM: PORTABLE CHEST 1 VIEW COMPARISON:  12/18/2022. FINDINGS: Underinflation. Enlarged cardiopericardial silhouette with widened mediastinum. Vascular congestion. Small right pleural effusion. No pneumothorax. Film is under penetrated. Overlapping cardiac leads. Lordotic x-ray. IMPRESSION: Enlarged cardiopericardial silhouette with vascular congestion. Small right pleural effusion. Under penetrated radiograph Electronically Signed   By: Ranell Bring M.D.   On: 01/14/2023 15:17     Procedures Procedures    Medications Ordered in ED Medications  morphine  (PF) 4 MG/ML injection 4 mg (4 mg Intravenous Given 01/14/23 1423)  furosemide  (LASIX ) injection 40 mg (40 mg Intravenous Given 01/14/23 1721)  potassium chloride  SA (KLOR-CON  M) CR tablet 40 mEq (40 mEq Oral Given 01/14/23 1722)    ED Course/ Medical Decision Making/ A&P Clinical Course as of 01/14/23 1741  Sat Jan 14, 2023  1531 CBC(!) Anemia stable compared to previous values [JK]  1531 Basic metabolic panel(!) Creatinine slightly increased compared to previous [JK]  1531 Chest x-ray shows enlarged cardiac silhouette with vascular congestion [JK]  1531 X-rays without acute bony abnormality soft tissue swelling noted [JK]  1552 Case discussed with Dr. Pearlean regarding admission [JK]    Clinical Course User Index [JK] Randol Simmonds, MD                                 Medical Decision Making Amount and/or Complexity of Data Reviewed Labs: ordered. Decision-making details documented in ED Course. Radiology: ordered.  Risk Prescription drug management. Decision regarding hospitalization.   Pt presented to the ED with complaints of foot pain, leg swelling.  Exam not suggestive of infection. No signs of vascular compromise.  Xrays without bone abnormality.  Pt with signs of chf fluid overload.  Increased bnp, peripheral edema.  Plan on admission IV diuresis.          Final Clinical Impression(s) / ED Diagnoses Final diagnoses:  Acute on chronic congestive heart failure, unspecified heart failure type [facility])    Rx / DC Orders ED Discharge Orders     None         Randol Simmonds, MD 01/14/23 [phone]

## 2023-01-14 NOTE — Assessment & Plan Note (Signed)
 Blood pressure now soft.  On carvedilol and midodrine. -Hold carvedilol for now to allow for diuresis - resume midodrine, started during recent hospitalization

## 2023-01-14 NOTE — ED Notes (Signed)
 Patient requesting something for itching skin. MD notified.

## 2023-01-14 NOTE — Progress Notes (Signed)
Attempted to call for report x1 no answer 

## 2023-01-14 NOTE — ED Triage Notes (Signed)
 Pt with bilateral feet swelling and pain since last night.

## 2023-01-15 DIAGNOSIS — I5043 Acute on chronic combined systolic (congestive) and diastolic (congestive) heart failure: Secondary | ICD-10-CM

## 2023-01-15 LAB — CBC
HCT: 28.2 % — ABNORMAL LOW (ref 39.0–52.0)
Hemoglobin: 7.3 g/dL — ABNORMAL LOW (ref 13.0–17.0)
MCH: 20.6 pg — ABNORMAL LOW (ref 26.0–34.0)
MCHC: 25.9 g/dL — ABNORMAL LOW (ref 30.0–36.0)
MCV: 79.7 fL — ABNORMAL LOW (ref 80.0–100.0)
Platelets: 308 10*3/uL (ref 150–400)
RBC: 3.54 MIL/uL — ABNORMAL LOW (ref 4.22–5.81)
RDW: 23.4 % — ABNORMAL HIGH (ref 11.5–15.5)
WBC: 12.4 10*3/uL — ABNORMAL HIGH (ref 4.0–10.5)
nRBC: 0.5 % — ABNORMAL HIGH (ref 0.0–0.2)

## 2023-01-15 LAB — GLUCOSE, CAPILLARY
Glucose-Capillary: 99 mg/dL (ref 70–99)
Glucose-Capillary: 135 mg/dL — ABNORMAL HIGH (ref 70–99)
Glucose-Capillary: 208 mg/dL — ABNORMAL HIGH (ref 70–99)
Glucose-Capillary: 122 mg/dL — ABNORMAL HIGH (ref 70–99)
Glucose-Capillary: 93 mg/dL (ref 70–99)

## 2023-01-15 LAB — BASIC METABOLIC PANEL
Anion gap: 8 (ref 5–15)
BUN: 30 mg/dL — ABNORMAL HIGH (ref 8–23)
CO2: 22 mmol/L (ref 22–32)
Calcium: 8.5 mg/dL — ABNORMAL LOW (ref 8.9–10.3)
Chloride: 110 mmol/L (ref 98–111)
Creatinine, Ser: 1.71 mg/dL — ABNORMAL HIGH (ref 0.61–1.24)
GFR, Estimated: 43 mL/min — ABNORMAL LOW (ref >60)
Glucose, Bld: 98 mg/dL (ref 70–99)
Potassium: 4.4 mmol/L (ref 3.5–5.1)
Sodium: 140 mmol/L (ref 135–145)

## 2023-01-15 MED ORDER — FOLIC ACID 1 MG PO TABS
1.0000 mg | ORAL_TABLET | Freq: Every day | ORAL | Status: DC
  Administered 2023-01-15 – 2023-01-17 (×3): 1 mg via ORAL
  Filled 2023-01-15 (×3): qty 1

## 2023-01-15 MED ORDER — ALPRAZOLAM 0.25 MG PO TABS
0.2500 mg | ORAL_TABLET | Freq: Two times a day (BID) | ORAL | Status: DC | PRN
  Administered 2023-01-16 – 2023-01-17 (×2): 0.25 mg via ORAL
  Filled 2023-01-15 (×2): qty 1

## 2023-01-15 MED ORDER — HYDROXYZINE HCL 10 MG PO TABS
10.0000 mg | ORAL_TABLET | Freq: Once | ORAL | Status: AC
  Administered 2023-01-15: 10 mg via ORAL
  Filled 2023-01-15: qty 1

## 2023-01-15 MED ORDER — FERROUS SULFATE 325 (65 FE) MG PO TABS
325.0000 mg | ORAL_TABLET | Freq: Every day | ORAL | Status: DC
  Administered 2023-01-16 – 2023-01-17 (×2): 325 mg via ORAL
  Filled 2023-01-15 (×2): qty 1

## 2023-01-15 NOTE — Progress Notes (Signed)
 Pt A&O x 4. Pt has removed telemetry leads several times during the night. Reinforced importance of telemetry to pt. Pt has made inappropriate comments toward nursing staff. I can't touch, but I can look and it sure is nice to. Pt also asking for inappropriate touch requesting for his back scratched and stating he wants to be greased down. Security services spoke to patient about his behavior.

## 2023-01-15 NOTE — TOC Initial Note (Signed)
 Transition of Care [facility]) - Initial/Assessment Note    Patient Details  Name: [name] MRN: [id number] Date of Birth: June 28, 1955  Transition of Care [facility]) CM/SW Contact:    Rollo Petri, LCSW Phone Number: 01/15/2023, 11:36 AM  Clinical Narrative:                  Pt admitted from home. He has a high readmission risk score. Pt known to TOC from previous admissions. PT recommending SNF.  Met with pt at bedside to assess. Pt lives alone. He has had several falls recently. Pt able to get to appointments and obtain medications.   Reviewed PT recommendation for SNF rehab at dc. Pt agreeable and asks TOC to call his daughter. CMS provider options were reviewed with pt and with his daughter. Will refer as requested and follow for dc planning.  [facility] notification of admission completed. Reference number is [id number].   Expected Discharge Plan: Skilled Nursing Facility Barriers to Discharge: Continued Medical Work up   Patient Goals and CMS Choice Patient states their goals for this hospitalization and ongoing recovery are:: SNF CMS Medicare.gov Compare Post Acute Care list provided to:: Patient Choice offered to / list presented to : Patient Melfa ownership interest in [facility].provided to:: Patient    Expected Discharge Plan and Services In-house Referral: Clinical Social Work   Post Acute Care Choice: Skilled Nursing Facility Living arrangements for the past 2 months: Single Family Home                                      Prior Living Arrangements/Services Living arrangements for the past 2 months: Single Family Home Lives with:: Self Patient language and need for interpreter reviewed:: Yes Do you feel safe going back to the place where you live?: Yes      Need for Family Participation in Patient Care: No (Comment)   Current home services: DME Criminal Activity/Legal Involvement Pertinent to Current Situation/Hospitalization: No -  Comment as needed  Activities of Daily Living   ADL Screening (condition at time of admission) Independently performs ADLs?: Yes (appropriate for developmental age) Is the patient deaf or have difficulty hearing?: No Does the patient have difficulty seeing, even when wearing glasses/contacts?: No Does the patient have difficulty concentrating, remembering, or making decisions?: No  Permission Sought/Granted Permission sought to share information with : Facility Industrial/product Designer granted to share information with : Yes, Verbal Permission Granted     Permission granted to share info w AGENCY: snfs        Emotional Assessment Appearance:: Appears stated age Attitude/Demeanor/Rapport: Engaged Affect (typically observed): Accepting Orientation: : Oriented to Self, Oriented to Place, Oriented to  Time, Oriented to Situation Alcohol  / Substance Use: Not Applicable Psych Involvement: No (comment)  Admission diagnosis:  Acute on chronic combined systolic (congestive) and diastolic (congestive) heart failure (HCC) [I50.43] Acute on chronic congestive heart failure, unspecified heart failure type [facility]) [I50.9] Patient Active Problem List   Diagnosis Date Noted   Acute on chronic combined systolic (congestive) and diastolic (congestive) heart failure (HCC) 01/14/2023   Frequent falls 01/14/2023   Diabetic nephropathy (HCC) 01/13/2023   Hidradenitis 01/13/2023   Hyperlipidemia, mixed 01/13/2023   Obesity 01/13/2023   Opioid dependence (HCC) 01/13/2023   Old anterior cruciate ligament disruption 01/13/2023   Spermatocele 01/13/2023   Spinal stenosis of lumbar region 01/13/2023   Varicocele 01/13/2023  Left ventricular thrombus 01/13/2023   Stimulant abuse (HCC) 01/13/2023   Chronic low back pain 01/13/2023   Hydrocele in adult 01/13/2023   CHF (congestive heart failure) (HCC) 01/13/2023   Osteoarthritis of knees, bilateral 01/13/2023   Chronic kidney disease, stage  3b (HCC) 12/21/2022   Perianal abscess 12/19/2022   Adenomatous polyp of transverse colon 11/11/2022   H/O medication noncompliance 11/09/2022   Pleural effusion 11/10/2021   Mediastinal lymphadenopathy 11/09/2021   Stroke (cerebrum) (HCC) 11/07/2021   Protein calorie malnutrition (HCC) 11/07/2021   Scrotal pain; chronic, bilateral 07/05/2021   Microscopic hematuria 07/05/2021   Organic impotence 07/05/2021   Opiate overdose (HCC)    Uncontrolled type 2 diabetes mellitus with hyperglycemia (HCC) 05/16/2019   Cocaine abuse (HCC) 05/15/2019   Apical mural thrombus 10/15/2018   Claudication in peripheral vascular disease (HCC) 11/30/2017   PAD (peripheral artery disease) (HCC) 11/28/2017   Acute kidney injury superimposed on chronic kidney disease (HCC) 01/31/2016   Cocaine use 09/04/2015   Essential hypertension 06/29/2015   Status post coronary artery stent placement 06/29/2015   Insulin  dependent diabetes mellitus 06/29/2015   History of MI (myocardial infarction) 06/29/2015   OSA on CPAP 01/21/2015   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 09/09/2013   Coronary artery disease 09/09/2013   Ischemic cardiomyopathy 09/09/2013   S/P LAD DES June 2014 09/06/2013   Tobacco use disorder 09/06/2013   Hypertensive heart disease    PCP:  Carlette Benita Area, MD Pharmacy:   [facility], [place] - [address] [place] [address] Phone: [phone] Fax: [phone]  [facility] [place], [place] - [address] [place] [address] Phone: [phone] Fax: [phone]  [facility] - [place], [place] - [address] [place] [address] Phone: [phone] Fax: [phone]     Social Drivers of Health (SDOH) Social History: SDOH Screenings   Food Insecurity: No Food Insecurity (01/14/2023)  Housing: Low Risk  (01/14/2023)  Transportation Needs: No Transportation Needs (01/14/2023)   Utilities: Not At Risk (01/14/2023)  Alcohol  Screen: Low Risk  (12/14/2022)  Depression (PHQ2-9): Low Risk  (12/14/2022)  Financial Resource Strain: Low Risk  (12/14/2022)  Physical Activity: Inactive (12/14/2022)  Social Connections: Moderately Integrated (01/14/2023)  Stress: Stress Concern Present (12/14/2022)  Tobacco Use: High Risk (01/14/2023)  Health Literacy: Adequate Health Literacy (12/14/2022)   SDOH Interventions:     Readmission Risk Interventions    12/19/2022   10:56 AM 11/04/2022   10:22 AM 10/21/2022    1:05 PM  Readmission Risk Prevention Plan  Transportation Screening Complete Complete Complete  HRI or Home Care Consult  Complete Complete  Social Work Consult for Recovery Care Planning/Counseling  Complete Complete  Palliative Care Screening  Not Applicable Not Applicable  Medication Review Oceanographer) Complete Complete Complete  HRI or Home Care Consult Complete    SW Recovery Care/Counseling Consult Complete    Palliative Care Screening Not Applicable    Skilled Nursing Facility Not Applicable

## 2023-01-15 NOTE — Progress Notes (Addendum)
 Called code stroke for stuttering and slurred speech. Pt alert and oriented x 4. NIHSS score 1. MD assessed pt and wants to cancel code stroke. MD would like neuro assessment instead.   Pt's daughter, [name], was notified of patient's status.

## 2023-01-15 NOTE — Plan of Care (Signed)
  Problem: Acute Rehab PT Goals(only PT should resolve) Goal: Pt Will Go Supine/Side To Sit Flowsheets (Taken 01/15/2023 1011) Pt will go Supine/Side to Sit: Independently Goal: Patient Will Perform Sitting Balance Flowsheets (Taken 01/15/2023 1011) Patient will perform sitting balance: Independently Goal: Patient Will Transfer Sit To/From Stand Flowsheets (Taken 01/15/2023 1011) Patient will transfer sit to/from stand: Independently Goal: Pt Will Perform Standing Balance Or Pre-Gait Flowsheets (Taken 01/15/2023 1011) Pt will perform standing balance or pre-gait: Independently Goal: Pt Will Ambulate Flowsheets (Taken 01/15/2023 1011) Pt will Ambulate:  > 125 feet  with modified independence  with least restrictive assistive device Goal: Pt Will Go Up/Down Stairs Flowsheets (Taken 01/15/2023 1011) Pt will Go Up / Down Stairs:  Flight  with modified independence  [name], DPT [facility]- Deputy [phone] office

## 2023-01-15 NOTE — Evaluation (Signed)
 Physical Therapy Evaluation Patient Details Name: [name] MRN: [id number] DOB: [birth year] Today's Date: 01/15/2023  History of Present Illness  a 68 y.o. male with medical history significant for congestive heart failure, coronary artery disease, diabetes mellitus, hypertension, stroke, LV thrombus, cocaine abuse.  Patient presented to the ED with complaints of bilateral lower extremity swelling this has been going on for several months, he reports last night swelling got worse, with pain involving the heels of both of his legs.  He denies chest pain, no difficulty breathing, no cough.  He is not on home O2.  He reports compliance with his torsemide  20 mg daily.  [name] called also saying patient had had multiple falls, and would like placement  Clinical Impression   Pt tolerated today's Physical Therapy Evaluation, well with decent carryover for all means of mobility. Pt demonstrating reduced functional capacity in prolonged activities with transfers and ambulation due to generalized muscle weakness, decreased balance reactions, increased BLE swelling, reduced ROM, and pain. Based upon these deficits/impairments, patient will benefit from continued skilled physical therapy services during remainder of hospital stay and at the next recommended venue of care to address deficits and promote return to optimal function.                If plan is discharge home, recommend the following: A little help with walking and/or transfers;A little help with bathing/dressing/bathroom;Help with stairs or ramp for entrance;Assistance with cooking/housework   Can travel by private vehicle   No    Equipment Recommendations None recommended by PT  Recommendations for Other Services       Functional Status Assessment Patient has had a recent decline in their functional status and demonstrates the ability to make significant improvements in function in a reasonable and predictable amount of  time.     Precautions / Restrictions Precautions Precautions: Fall Restrictions Weight Bearing Restrictions Per Provider Order: No      Mobility  Bed Mobility Overal bed mobility: Needs Assistance Bed Mobility: Supine to Sit, Sit to Supine     Supine to sit: Contact guard Sit to supine: Mod assist   General bed mobility comments: increased time, labored movement; assist given at BLE when returning to supine    Transfers Overall transfer level: Needs assistance Equipment used: Rolling walker (2 wheels), None Transfers: Sit to/from Stand Sit to Stand: Contact guard assist           General transfer comment: initial sit/stand incomplete with use RW, pt sat back down, stood up independently from EOB to adjust pants, you want to move, lets move    Ambulation/Gait Ambulation/Gait assistance: Supervision, Contact guard assist Gait Distance (Feet): 40 Feet   Gait Pattern/deviations: Decreased step length - right, Decreased step length - left, Decreased stride length Gait velocity: decreased     General Gait Details: slightly labored cadence with good return for pushing RW, unsteady with turning in room  Stairs            Wheelchair Mobility     Tilt Bed    Modified Rankin (Stroke Patients Only)       Balance Overall balance assessment: Needs assistance Sitting-balance support: Feet supported, No upper extremity supported Sitting balance-Leahy Scale: Good Sitting balance - Comments: seated at EOB   Standing balance support: During functional activity, Bilateral upper extremity supported, No upper extremity supported Standing balance-Leahy Scale: Fair Standing balance comment: fair without AD and good with AD  Pertinent Vitals/Pain Pain Assessment Pain Assessment: Faces Faces Pain Scale: Hurts little more Pain Location: BLE Pain Descriptors / Indicators: Sore, Grimacing, Discomfort Pain Intervention(s):  Monitored during session    Home Living Family/patient expects to be discharged to:: Private residence Living Arrangements: Non-relatives/Friends Available Help at Discharge: Family;Friend(s);Available PRN/intermittently;Personal care attendant Type of Home: Mobile home Home Access: Stairs to enter Entrance Stairs-Rails: Right;Left;Can reach both Entrance Stairs-Number of Steps: 2   Home Layout: One level Home Equipment: Rollator (4 wheels);Shower seat;Toilet riser      Prior Function Prior Level of Function : Independent/Modified Independent             Mobility Comments: Household ambulation using Rollator ADLs Comments: Doesn't drive and says he has help with shopping. independent with ADLs, reports aide coming in 7x/week with cooking and cleaning     Extremity/Trunk Assessment   Upper Extremity Assessment Upper Extremity Assessment: Defer to OT evaluation    Lower Extremity Assessment Lower Extremity Assessment: Generalized weakness    Cervical / Trunk Assessment Cervical / Trunk Assessment: Normal  Communication   Communication Communication: No apparent difficulties  Cognition Arousal: Alert Behavior During Therapy: WFL for tasks assessed/performed Overall Cognitive Status: Within Functional Limits for tasks assessed                                          General Comments      Exercises     Assessment/Plan    PT Assessment Patient needs continued PT services  PT Problem List Decreased strength;Decreased activity tolerance;Decreased balance;Decreased mobility       PT Treatment Interventions DME instruction;Gait training;Stair training;Functional mobility training;Therapeutic activities;Therapeutic exercise;Balance training;Patient/family education    PT Goals (Current goals can be found in the Care Plan section)  Acute Rehab PT Goals Patient Stated Goal: go to rehab and then return home PT Goal Formulation: With patient Time  For Goal Achievement: 01/29/23 Potential to Achieve Goals: Good    Frequency Min 3X/week     Co-evaluation               AM-PAC PT 6 Clicks Mobility  Outcome Measure Help needed turning from your back to your side while in a flat bed without using bedrails?: None Help needed moving from lying on your back to sitting on the side of a flat bed without using bedrails?: None Help needed moving to and from a bed to a chair (including a wheelchair)?: None Help needed standing up from a chair using your arms (e.g., wheelchair or bedside chair)?: A Little Help needed to walk in hospital room?: A Little Help needed climbing 3-5 steps with a railing? : A Lot 6 Click Score: 20    End of Session Equipment Utilized During Treatment: Gait belt Activity Tolerance: Patient tolerated treatment well;Patient limited by fatigue Patient left: in bed;with call bell/phone within reach;with bed alarm set Nurse Communication: Mobility status PT Visit Diagnosis: Unsteadiness on feet (R26.81);Other abnormalities of gait and mobility (R26.89);Muscle weakness (generalized) (M62.81)    Time: 9078-9040 PT Time Calculation (min) (ACUTE ONLY): 38 min   Charges:   PT Evaluation $PT Eval Low Complexity: 1 Low PT Treatments $Therapeutic Activity: 8-22 mins PT General Charges $$ ACUTE PT VISIT: 1 Visit         Omega JONETTA Donna ALMETA, DPT [facility] [place] [address] [phone] office  Omega JONETTA Donna 01/15/2023, 10:09 AM

## 2023-01-15 NOTE — Progress Notes (Signed)
 Patient has healing sacral wound; 0.5x0.5cm area; blanches but does have a 0.25 slit opening to tissue underneath that is skin colored; no drainage. Patient reports staying off his bottom to allow healing. Patient propped to side to avoid pressure to that area.

## 2023-01-15 NOTE — Consult Note (Signed)
 [facility] Tele[facility]urology Consult Services  Stat Consult  Patient Name:   [name], [name] Date of Birth:   02-08-1955 Identification Number:   MRN - [id number] Date of Service:   01/15/2023 05:56:26  Diagnosis:       G93.41 - Encephalopathy Metabolic  Impression 68 yo M currently admitted for management of acute-on-chronic combined heart failure and AKI on CKD on whom Neurology was consulted for evaluation of slurred/stuttering speech. His neurologic exam was only notable for decreased level of consciousness (stupor) and mild-moderate dysarthria (suspected to be limited due to mental status). Given the non-focal pattern of his symptoms, suspect the most likely etiology to be a more diffuse/generalized process (e.g., metabolic/infectious encephalopathy, medication side effect, etc.) rather than acute stroke. As patient was given hydroxyzine  prior to symptom onset, suspect this may be the etiology. If mental status improves but dysarthria persists, would then consider MRI brain without contrast for further evaluation.   Recommendations: Our recommendations are outlined below.  1) Avoid/minimize hydroxyzine  and other medications known to cause or exacerbate delirium (e.g., benzodiazepines, opioids, anticholinergics, etc.) 2) Will defer any metabolic/infectious workup to primary team 3) If mental status improves but slurred/stuttering speech persist, would obtain MRI brain without contrast for further evaluation  Sign Out: Discussed with Hospitalist Team  ----------------------------------------------------------------------------------------------------   Metrics: Dispatch Time: 01/15/2023 05:52:44 Callback Response Time: 01/15/2023 05:56:47  Primary Provider Notified of Diagnostic Impression and Management Plan on: 01/15/2023 06:36:32  Imaging No recent neuroimaging available for  review  ----------------------------------------------------------------------------------------------------  Chief Complaint: slurred/stuttering speech  History of Present Illness: Patient is a 68 year old Male. Patient was initially admitted on 1/4 for management of currently admitted for management of acute on chronic combined congestive heart failure and AKI on CKD. On the morning of 1/5, patient was noted by staff to have acute onset of slurred/stuttering speech. There were no other focal deficits reported, though he was noted to have generalized weakness per discussion with primary provider. Patient was last known normal around 3:45 AM this morning. Of note, he was given hydroxyzine  roughly one hour before symptom onset.   Past Medical History:      Hypertension      Diabetes Mellitus      Coronary Artery Disease Other PMH:  PAD, prior LV thrombus  Medications:  No Anticoagulant use  Antiplatelet use: Yes ASA Reviewed EMR for current medications  Allergies:  Reviewed Description: Plavix   Social History: Drug Use: Yes  Family History:  Family History Cannot Be Obtained Because:Patient Is Obtunded/ Comatose  ROS : ROS Cannot Be Obtained Because:  Patient Is Obtunded/ Comatose  Past Surgical History: Past Surgical History Cannot Be Obtained Because: Patient Is Obtunded/ Comatose   Examination: BP(141/83), Pulse(100), 1A: Level of Consciousness - Requires repeated stimulation to arouse + 2 1B: Ask Month and Age - Both Questions Right + 0 1C: Blink Eyes & Squeeze Hands - Performs Both Tasks + 0 2: Test Horizontal Extraocular Movements - Normal + 0 3: Test Visual Fields - No Visual Loss + 0 4: Test Facial Palsy (Use Grimace if Obtunded) - Normal symmetry + 0 5A: Test Left Arm Motor Drift - No Drift for 10 Seconds + 0 5B: Test Right Arm Motor Drift - No Drift for 10 Seconds + 0 6A: Test Left Leg Motor Drift - No Drift for 5 Seconds + 0 6B: Test Right Leg Motor Drift  - No Drift for 5 Seconds + 0 7: Test Limb Ataxia (FNF/Heel-Shin) - No Ataxia + 0 8:  Test Sensation - Normal; No sensory loss + 0 9: Test Language/Aphasia - Normal; No aphasia + 0 10: Test Dysarthria - Mild-Moderate Dysarthria: Slurring but can be understood + 1 11: Test Extinction/Inattention - No abnormality + 0  NIHSS Score: 3  Spoke with : Dr. Keturah This consult was conducted in real time using interactive audio and immunologist. Patient was informed of the technology being used for this visit and agreed to proceed. Patient located in hospital and provider located at home/office setting.  Patient is being evaluated for possible acute neurologic impairment and high probability of imminent or life - threatening deterioration.I spent total of 35 minutes providing care to this patient, including time for face to face visit via telemedicine, review of medical records, imaging studies and discussion of findings with providers, the patient and / or family.  Dr Eva De'Prey  [facility] For Inpatient follow-up with [facility] physician please call [facility] at [phone]. As we are not an outpatient service for any post hospital discharge needs please contact the hospital for assistance.  If you have any questions for the [facility] physicians or need to reconsult for clinical or diagnostic changes please contact us  via [facility] at [phone].

## 2023-01-15 NOTE — Patient Instructions (Signed)
 Visit Information  Thank you for taking time to visit with me today. Please don't hesitate to contact me if I can be of assistance to you.   Following are the goals we discussed today:   Goals Addressed               This Visit's Progress     Find Help in My Community. (pt-stated)   On track     Care Coordination Interventions:   Interventions Today    Flowsheet Row Most Recent Value  Chronic Disease   Chronic disease during today's visit Congestive Heart Failure (CHF), Hypertension (HTN), Diabetes, Chronic Kidney Disease/End Stage Renal Disease (ESRD), Other  [Meningioma w/ Encephalopathy & Cerebral Edema, Cocaine Abuse, Tobacco Abuse, Inability to Perform Activities of Daily Living Independently, Caregiver Burnout/Stress/Fatigue, Chronic Pain, Pleural Effusion, Re-Hospitalized, Daughter Requesting Placement.]  General Interventions   General Interventions Discussed/Reviewed General Interventions Discussed, Labs, Vaccines, Doctor Visits, Health Screening, Annual Foot Exam, Lipid Profile, General Interventions Reviewed, Annual Eye Exam, Durable Medical Equipment (DME), Community Resources, Level of Care, Communication with  [Encouraged Routine Engagement with Care Team Members & Providers.]  Labs Hgb A1c every 3 months, Kidney Function  [Encouraged Routine Labwork.]  Vaccines COVID-19, Flu, Pneumonia, RSV, Shingles, Tetanus/Pertussis/Diphtheria  [Encouraged Routine Vaccinations.]  Doctor Visits Discussed/Reviewed Doctor Visits Discussed, Specialist, Doctor Visits Reviewed, Annual Wellness Visits, PCP  [Encouraged Routine Engagement with Care Team Members & Providers.]  Health Screening Bone Density, Colonoscopy, Prostate  [Encouraged Routine Health Screenings.]  Durable Medical Equipment (DME) [name], Wheelchair, Research Scientist (medical) & Daughter Scientist, Forensic with Back, Glucometer or Continuous Glucose Monitor, Blood Pressure Monitor & Scales. CSW Will Request  Order from Primary Care Provider.]  Wheelchair Standard  PCP/Specialist Visits Compliance with follow-up visit  [Encouraged Routine Engagement with Care Team Members & Providers.]  Communication with PCP/Specialists, RN, Pharmacists, Social Work  [employer] Routine Engagement with Care Team Members & Providers.]  Level of Care Adult Daycare, Air Traffic Controller, Assisted Living, Skilled Nursing Facility  [Two Weeks Ago, Patient Did Not Qualify for Gaffer at Skilled Nursing Facility Upon Discharge from Hospital, Returning Home with Home Health Services through [facility].]  Applications Medicaid, [place], Other  [Now That Patient Has Been Re-Hospitalized, Daughter is Wanting to [employer] Level of Care Options (I.e Skilled Secondary School Teacher for Gaffer or Long-Term Memory Care Assisted Living Facility Placement).]  Exercise Interventions   Exercise Discussed/Reviewed Exercise Discussed, Assistive device use and maintanence, Exercise Reviewed, Physical Activity, Weight Managment  [Patient Encouraged to Work with Physical Therapist & Acupuncturist, While Hospitalized.]  Physical Activity Discussed/Reviewed Home Exercise Program (HEP), Physical Activity Discussed, PREP, Physical Activity Reviewed, Gym, Types of exercise  [Encouraged Increased Level of Activity & Exercise, as Tolerated.]  Weight Management Weight loss  [Encouraged Healthy [employer] Loss Program.]  Education Interventions   Education Provided Provided Therapist, Sports, Provided Web-based Education, Provided Education  [employer] Daughter: Merchandiser, Retail Long-Term Care Medicaid Application, Passenger Transport Manager, Aide & Attendance Benefits Information & Application, Aging, Disability & Transit Engineer, Technical Sales.]  Provided Verbal Education On Nutrition, Mental Health/Coping with Illness, When to see the doctor, Foot Care, Eye Care, Labs,  Applications, Blood Sugar Monitoring, Exercise, Medication, [employer], Psychologist, Sport And Exercise Reviewed to Ensure Understanding & Entertain Questions.]  Labs Reviewed Hgb A1c  [Reviewed.]  Applications Medicaid, [place], Other  [Now That Patient Has Been Re-Hospitalized, Daughter is Wanting to [employer] Level of Care Options (I.e Skilled Nursing  Facility Placement for [employer] or Long-Term Memory Care Assisted Living Facility Placement).]  Mental Health Interventions   Mental Health Discussed/Reviewed Mental Health Discussed, Anxiety, Mental Health Reviewed, Depression, Grief and Loss, Coping Strategies, Substance Abuse, Suicide, Crisis, Other  [Assessed Mental Health & Cognitive Status.]  Nutrition Interventions   Nutrition Discussed/Reviewed Nutrition Discussed, Adding fruits and vegetables, Increasing proteins, Nutrition Reviewed, Decreasing fats, Fluid intake, Carbohydrate meal planning, Portion sizes, Decreasing salt, Decreasing sugar intake  [Encouraged Heart-Healthy, Diabetic-Friendly, Low Sodium, Renal-Friendly, Reduced Fat Diet.]  Pharmacy Interventions   Pharmacy Dicussed/Reviewed Pharmacy Topics Discussed, Medications and their functions, Medication Adherence, Pharmacy Topics Reviewed, Affording Medications  [Confirmed Ability to Afford Prescription Medications.]  Medication Adherence --  [Confirmed Prescription Medication Compliance.]  Safety Interventions   Safety Discussed/Reviewed Safety Discussed, Safety Reviewed, Fall Risk, Home Safety  [Encouraged Routine Use of Assistive Devices & Durable Medical Equipment.]  Home Safety Assistive Devices, Need for home safety assessment, Refer for community resources, Refer for home visit, Contact provider for referral to PT/OT, Contact home health agency  [Encouraged Consideration of Gaffer in Skilled Nursing Facility, Upon Discharge from Hospital.]  Advanced Directive  Interventions   Advanced Directives Discussed/Reviewed Advanced Directives Discussed, Advanced Directives Reviewed, Advanced Care Planning, Guardianship  [Encouraged Initiation of Advanced Directives (Living Will & Healthcare Power of Corporate Treasurer), Offering to [employer], Assist with Completion, Make Copies & Scan into Electronic Medical Record in Epic.]  Guardianship Provide resources, Refer to an agency  [Provided Daughter with Information on Filing for Guardianship at [employer] Process for Becoming Patient's Legal Guardian.]      Active Listening & Reflection Utilized. Verbalization of Feelings Encouraged. Emotional Support Provided. CSW Collaboration with Daughter, Haynes Giannotti to Confirm Continued Interest in Pursuing Higher Level of Care Placement Options (I.e Memory Care, Assisted Living, Extended Care, Skilled Nursing, Etc.).  CSW Collaboration with Daughter, Jovan Colligan to Confirm Receipt & Thoroughly Review The Following List of Resources, Emailed ([email or web address]) on 01/02/2023:       ~ Assisted Living Facilities in [place], [place], [place], [employer] &                [facility].       ~ Extended Intel in [place], [place], [place], [employer] &              [facility].      ~ Memory Care Assisted Living Facilities in [place], [place], [place],          [employer] & [place].      ~ Skilled Nursing Facilities in [place], [place], [place], [facility] &          [place].  CSW Collaboration with Daughter, Kees Idrovo to Encourage Review of The Following List of Resources, Emailed ([email or web address]) on 01/13/2023:       ~ How to Apply for Aid & Attendance Benefits - Cigna.      ~ Aid & Attendance Benefits Application - Merchandiser, Retail.       ~ Associate Professor to [employer] - Merchandiser, Retail.       ~ Advice Worker - Merchandiser, Retail.       ~  Public Librarian in Building Surveyor of Claim - Merchandiser, Retail.      ~ Personal Assistant.      ~ Step-By-Step Instructions on How to Apply for Health Net.      ~ 2024 Medicaid Income Guidelines & Reserve [employer].      ~ Making Medicaid Accessible Through [employer]  Health Walk-In Clinic.      ~ Aging, Disability & Transit Services of [facility].       ~ [name].      ~ Magazine Features Editor Resources in [place].       ~ Advanced Directives (Living Will Scientific Laboratory Technician              Documents).       ~ Instructions on How to File for Legal Guardianship.      ~ Guardianship Application.      ~ [employer] Brochure. CSW Collaboration with Daughter, Gahel Safley to Encourage Routine Engagement with Andreika Vandagriff, Licensed Clinical Social Worker with [facility] [phone]), if She Has Questions, Needs Assistance, or If Additional Social Work Needs Are Identified Between Now & Our Next Follow-Up Outreach Call, Scheduled on 01/27/2023 at 1:45 PM.      Our next appointment is by telephone on 01/27/2023 at 1:45 pm.  Please call the care guide team at [phone] if you need to cancel or reschedule your appointment.   If you are experiencing a Mental Health or Behavioral Health Crisis or need someone to talk to, please call the Suicide and Crisis Lifeline: 988 call the [place]  National Suicide Prevention Lifeline: 859-446-2759 or TTY: 312-698-1320 TTY 859-724-8159) to talk to a trained counselor call 1-800-273-TALK (toll free, 24 hour hotline) go to [facility] [address] [place] [phone]) call the [facility] Crisis Line: 754-021-6953 call 911  Patient verbalizes understanding of instructions and care plan provided today and agrees to view in MyChart. Active MyChart status and patient understanding of how to access  instructions and care plan via MyChart confirmed with patient.     Telephone follow up appointment with care management team member scheduled for:  01/27/2023 at 1:45 pm.  Philippe Desanctis, BSW, MSW, LCSW  Embedded Practice Social Work Case Manager  [facility], Population Health Direct Dial: [phone]  Fax: [phone] Email: [email or web address]: [email or web address]

## 2023-01-15 NOTE — Progress Notes (Signed)
 PROGRESS NOTE    ALDYN [name]  FMW:[id number] DOB: [birth year] DOA: 01/14/2023 PCP: Carlette Benita Area, MD   Brief Narrative:    [name] is a 68 y.o. male with medical history significant for congestive heart failure, coronary artery disease, diabetes mellitus, hypertension, stroke, LV thrombus, cocaine abuse. Patient presented to the ED with complaints of bilateral lower extremity swelling this has been going on for several months, he reports last night swelling got worse, with pain involving the heels of both of his legs.  Patient was admitted with acute on chronic combined systolic and diastolic CHF exacerbation along with some AKI.  He is also noted to have frequent falls and overnight was starting to have some slurred speech after being given hydroxyzine , but this has now improved.  PT now recommending SNF placement.  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  BNP elevated at 1193. With AKI on CKD.  Reports compliance with torsemide  20 mg daily.  Recent echo 12/2022 EF 30 to 35%. -IV Lasix  40 mg twice daily -Checked input output, daily weight, daily BMP - Trops x 2 -Emphasized need for Reds clip monitoring and urine output monitoring   Acute kidney injury superimposed on chronic kidney disease (HCC) AKI on CKD stage IIIa.  Creatinine 1.9, recent baseline 1.3-1.4.  In the setting of decompensated CHF. -Monitor  closely with diuresis, currently downtrending  Acute metabolic encephalopathy-resolved -Slurred speech likely due to hydroxyzine  use -No need for further imaging at this time   Frequent falls Daughter reports frequent falls.  Requesting placement. - PT recommending SNF placement   Coronary artery disease No chest pain.  EKG unchanged.   Essential hypertension Blood pressure now soft.  On carvedilol  and midodrine . 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  A1c 6.3. - SSI- S -Hold Jardiance ,  Lantus  5 units     Cocaine abuse (HCC) - uds pos for opiates  Obesity, class I -BMI 32.03  DVT prophylaxis:Heparin  Code Status: Full Family Communication: Discussed with daughter on phone 1/5 Disposition Plan:  Status is: Inpatient Remains inpatient appropriate because: Need for IV medications for diuresis.   Consultants:  TeleNeurology  Procedures:  None  Antimicrobials:  None   Subjective: Patient seen and evaluated today with no new acute complaints or concerns.  He is overall feeling better and speech appears back to baseline.  No confusion noted.  He states that he is urinating frequently.  Objective: Vitals:   01/14/23 2025 01/15/23 0021 01/15/23 0353 01/15/23 0527  BP: 105/67 94/66  100/67  Pulse: 100 88  77  Resp: 20 18  20   Temp: 97.9 F (36.6 C) 99 F (37.2 C)  98.9 F (37.2 C)  TempSrc:  Oral  Oral  SpO2: 100% 97%  98%  Weight:   98.4 kg   Height:        Intake/Output Summary (Last  24 hours) at 01/15/2023 0731 Last data filed at 01/15/2023 0600 Gross per 24 hour  Intake 1220 ml  Output --  Net 1220 ml   Filed Weights   01/14/23 1209 01/14/23 1422 01/15/23 0353  Weight: 106.6 kg 106.6 kg 98.4 kg    Examination:  General exam: Appears calm and comfortable  Respiratory system: Clear to auscultation. Respiratory effort  normal. Cardiovascular system: S1 & S2 heard, RRR.  Gastrointestinal system: Abdomen is soft Central nervous system: Alert and awake Extremities: 1-2+ pitting edema bilateral lower extremities Skin: No significant lesions noted Psychiatry: Flat affect.    Data Reviewed: I have personally reviewed following labs and imaging studies  CBC: Recent Labs  Lab 01/14/23 1422 01/15/23 0508  WBC 12.1* 12.4*  HGB 7.9* 7.3*  HCT 29.9* 28.2*  MCV 79.9* 79.7*  PLT 329 308   Basic Metabolic Panel: Recent Labs  Lab 01/14/23 1422 01/15/23 0508  NA 142 140  K 4.3 4.4  CL 112* 110  CO2 22 22  GLUCOSE 158* 98  BUN 31* 30*  CREATININE 1.92* 1.71*  CALCIUM  8.4* 8.5*  MG 2.2  --    GFR: Estimated Creatinine Clearance: 47.8 mL/min (A) (by C-G formula based on SCr of 1.71 mg/dL (H)). Liver Function Tests: No results for input(s): AST, ALT, ALKPHOS, BILITOT, PROT, ALBUMIN  in the last 168 hours. No results for input(s): LIPASE, AMYLASE in the last 168 hours. No results for input(s): AMMONIA in the last 168 hours. Coagulation Profile: No results for input(s): INR, PROTIME in the last 168 hours. Cardiac Enzymes: No results for input(s): CKTOTAL, CKMB, CKMBINDEX, TROPONINI in the last 168 hours. BNP (last 3 results) No results for input(s): PROBNP in the last 8760 hours. HbA1C: No results for input(s): HGBA1C in the last 72 hours. CBG: Recent Labs  Lab 01/14/23 2026 01/15/23 0515 01/15/23 0726  GLUCAP 150* 99 135*   Lipid Profile: No results for input(s): CHOL, HDL, LDLCALC, TRIG, CHOLHDL, LDLDIRECT in the last 72 hours. Thyroid  Function Tests: No results for input(s): TSH, T4TOTAL, FREET4, T3FREE, THYROIDAB in the last 72 hours. Anemia Panel: No results for input(s): VITAMINB12, FOLATE, FERRITIN, TIBC, IRON , RETICCTPCT in the last 72 hours. Sepsis Labs: No results for input(s): PROCALCITON, LATICACIDVEN in  the last 168 hours.  No results found for this or any previous visit (from the past 240 hours).       Radiology Studies: DG Foot Complete Left Result Date: 01/14/2023 CLINICAL DATA:  Bilateral foot pain and swelling. EXAM: LEFT FOOT - COMPLETE 3+ VIEW COMPARISON:  None Available. FINDINGS: The mineralization and alignment are normal. There is no evidence of acute fracture or dislocation. The joint spaces are preserved. There is mild spurring along the medial base of the 1st proximal phalanx. There is dorsal forefoot soft tissue swelling without foreign body or soft tissue emphysema. Prominent vascular calcifications are noted, suggesting underlying diabetes. IMPRESSION: Dorsal forefoot soft tissue swelling without evidence of acute osseous findings or foreign body. Prominent vascular calcifications suggesting underlying diabetes. Electronically Signed   By: Elsie Perone M.D.   On: 01/14/2023 15:21   DG Foot Complete Right Result Date: 01/14/2023 CLINICAL DATA:  Foot swelling and pain EXAM: RIGHT FOOT COMPLETE - 3+ VIEW COMPARISON:  None Available. FINDINGS: There is diffuse soft tissue swelling of the foot, particularly over the dorsum of the foot. There is no acute fracture or dislocation. No cortical erosions are seen. Peripheral vascular calcifications are present. Joint spaces are maintained. IMPRESSION: Diffuse soft tissue swelling  of the foot, particularly over the dorsum of the foot. No acute fracture or dislocation. Electronically Signed   By: Greig Pique M.D.   On: 01/14/2023 15:18   DG Chest Portable 1 View Result Date: 01/14/2023 CLINICAL DATA:  Pain and swelling. EXAM: PORTABLE CHEST 1 VIEW COMPARISON:  12/18/2022. FINDINGS: Underinflation. Enlarged cardiopericardial silhouette with widened mediastinum. Vascular congestion. Small right pleural effusion. No pneumothorax. Film is under penetrated. Overlapping cardiac leads. Lordotic x-ray. IMPRESSION: Enlarged cardiopericardial silhouette  with vascular congestion. Small right pleural effusion. Under penetrated radiograph Electronically Signed   By: Ranell Bring M.D.   On: 01/14/2023 15:17     Scheduled Meds:  aspirin  EC  81 mg Oral Daily   atorvastatin   40 mg Oral QHS   furosemide   40 mg Intravenous Q12H   heparin   5,000 Units Subcutaneous Q8H   insulin  aspart  0-5 Units Subcutaneous QHS   insulin  aspart  0-9 Units Subcutaneous TID WC   linaclotide   145 mcg Oral QAC breakfast   midodrine   10 mg Oral TID WC   pantoprazole   40 mg Oral Daily     LOS: 1 day    Time spent: 55 minutes    Yer Castello JONETTA Fairly, DO [facility]  If 7PM-7AM, please contact night-coverage [email or web address] 01/15/2023, 7:31 AM

## 2023-01-15 NOTE — Progress Notes (Signed)
 9464 Stroke cart activated by bedside nurse 0535 IPP Segars, MD at bedside assessing. Stutter noted, Left arm weakness noted. Pt is admitted for falls and genralized weakness.  LKWT 0350, ROS 0530 Thinners: yes, ASA and Heparin  shot 0540 TSP Paged for code stroke 0550 no CT Code stroke order placed. Clarified this with Segars MD. Keturah MD wishes to cancel code stroke and place Stat Neuro consult.  9443 Telespecialist called to cancel code stroke and place a stat consult.  NIH 1, MRS 2

## 2023-01-15 NOTE — Patient Outreach (Signed)
 Care Coordination   Follow Up Visit Note   01/13/2023 - Late Entry  Name: [name] MRN: [id number] DOB: Jun 25, 1955  [name] is a 68 y.o. year old male who sees [name], Benita Area, MD for primary care. I spoke with patient's daughter, [name] by phone today.  What matters to the patients health and wellness today?  Find Help in My Community.   Goals Addressed               This Visit's Progress     Find Help in My Community. (pt-stated)   On track     Care Coordination Interventions:   Interventions Today    Flowsheet Row Most Recent Value  Chronic Disease   Chronic disease during today's visit Congestive Heart Failure (CHF), Hypertension (HTN), Diabetes, Chronic Kidney Disease/End Stage Renal Disease (ESRD), Other  [Meningioma w/ Encephalopathy & Cerebral Edema, Cocaine Abuse, Tobacco Abuse, Inability to Perform Activities of Daily Living Independently, Caregiver Burnout/Stress/Fatigue, Chronic Pain, Pleural Effusion, Re-Hospitalized, Daughter Requesting Placement.]  General Interventions   General Interventions Discussed/Reviewed General Interventions Discussed, Labs, Vaccines, Doctor Visits, Health Screening, Annual Foot Exam, Lipid Profile, General Interventions Reviewed, Annual Eye Exam, Durable Medical Equipment (DME), Community Resources, Level of Care, Communication with  [Encouraged Routine Engagement with Care Team Members & Providers.]  Labs Hgb A1c every 3 months, Kidney Function  [Encouraged Routine Labwork.]  Vaccines COVID-19, Flu, Pneumonia, RSV, Shingles, Tetanus/Pertussis/Diphtheria  [Encouraged Routine Vaccinations.]  Doctor Visits Discussed/Reviewed Doctor Visits Discussed, Specialist, Doctor Visits Reviewed, Annual Wellness Visits, PCP  [Encouraged Routine Engagement with Care Team Members & Providers.]  Health Screening Bone Density, Colonoscopy, Prostate  [Encouraged Routine Health Screenings.]  Durable Medical Equipment (DME)  [name], Wheelchair, Research Scientist (medical) & Daughter Scientist, Forensic with Back, Glucometer or Continuous Glucose Monitor, Blood Pressure Monitor & Scales. CSW Will Request Order from Primary Care Provider.]  Wheelchair Standard  PCP/Specialist Visits Compliance with follow-up visit  [Encouraged Routine Engagement with Care Team Members & Providers.]  Communication with PCP/Specialists, RN, Pharmacists, Social Work  [employer] Routine Engagement with Care Team Members & Providers.]  Level of Care Adult Daycare, Air Traffic Controller, Assisted Living, Skilled Nursing Facility  [Two Weeks Ago, Patient Did Not Qualify for Gaffer at Skilled Nursing Facility Upon Discharge from Hospital, Returning Home with Home Health Services through [facility].]  Applications Medicaid, [place]-2, Other  [Now That Patient Has Been Re-Hospitalized, Daughter is Wanting to [employer] Level of Care Options (I.e Skilled Secondary School Teacher for Gaffer or Long-Term Memory Care Assisted Living Facility Placement).]  Exercise Interventions   Exercise Discussed/Reviewed Exercise Discussed, Assistive device use and maintanence, Exercise Reviewed, Physical Activity, Weight Managment  [Patient Encouraged to Work with Physical Therapist & Acupuncturist, While Hospitalized.]  Physical Activity Discussed/Reviewed Home Exercise Program (HEP), Physical Activity Discussed, PREP, Physical Activity Reviewed, Gym, Types of exercise  [Encouraged Increased Level of Activity & Exercise, as Tolerated.]  Weight Management Weight loss  [Encouraged Healthy [employer] Loss Program.]  Education Interventions   Education Provided Provided Therapist, Sports, Provided Web-based Education, Provided Education  [employer] Daughter: Merchandiser, Retail Long-Term Care Medicaid Application, Passenger Transport Manager, Aide & Attendance Benefits Information & Application, Aging,  Disability & Transit Engineer, Technical Sales.]  Provided Verbal Education On Nutrition, Mental Health/Coping with Illness, When to see the doctor, Foot Care, Eye Care, Labs, Applications, Blood Sugar Monitoring, Exercise, Medication, [employer], Psychologist, Sport And Exercise Reviewed to Ensure Understanding & Entertain  Questions.]  Labs Reviewed Hgb A1c  [Reviewed.]  Applications Medicaid, [place]-2, Other  [Now That Patient Has Been Re-Hospitalized, Daughter is Wanting to [employer] Level of Care Options (I.e Skilled Nursing Facility Placement for Short-Term Rehabilitative Services or Long-Term Memory Care Assisted Living Facility Placement).]  Mental Health Interventions   Mental Health Discussed/Reviewed Mental Health Discussed, Anxiety, Mental Health Reviewed, Depression, Grief and Loss, Coping Strategies, Substance Abuse, Suicide, Crisis, Other  [Assessed Mental Health & Cognitive Status.]  Nutrition Interventions   Nutrition Discussed/Reviewed Nutrition Discussed, Adding fruits and vegetables, Increasing proteins, Nutrition Reviewed, Decreasing fats, Fluid intake, Carbohydrate meal planning, Portion sizes, Decreasing salt, Decreasing sugar intake  [Encouraged Heart-Healthy, Diabetic-Friendly, Low Sodium, Renal-Friendly, Reduced Fat Diet.]  Pharmacy Interventions   Pharmacy Dicussed/Reviewed Pharmacy Topics Discussed, Medications and their functions, Medication Adherence, Pharmacy Topics Reviewed, Affording Medications  [Confirmed Ability to Afford Prescription Medications.]  Medication Adherence --  [Confirmed Prescription Medication Compliance.]  Safety Interventions   Safety Discussed/Reviewed Safety Discussed, Safety Reviewed, Fall Risk, Home Safety  [Encouraged Routine Use of Assistive Devices & Durable Medical Equipment.]  Home Safety Assistive Devices, Need for home safety assessment, Refer for community resources, Refer for home visit, Contact provider for  referral to PT/OT, Contact home health agency  [Encouraged Consideration of Gaffer in Skilled Nursing Facility, Upon Discharge from Hospital.]  Advanced Directive Interventions   Advanced Directives Discussed/Reviewed Advanced Directives Discussed, Advanced Directives Reviewed, Advanced Care Planning, Guardianship  [Encouraged Initiation of Advanced Directives (Living Will & Healthcare Power of Corporate Treasurer), Offering to [employer], Assist with Completion, Make Copies & Scan into Electronic Medical Record in Epic.]  Guardianship Provide resources, Refer to an agency  [Provided Daughter with Information on Filing for Guardianship at [employer] Process for Becoming Patient's Legal Guardian.]      Active Listening & Reflection Utilized. Verbalization of Feelings Encouraged. Emotional Support Provided. CSW Collaboration with Daughter, Shaquill Iseman to Confirm Continued Interest in Pursuing Higher Level of Care Placement Options (I.e Memory Care, Assisted Living, Extended Care, Skilled Nursing, Etc.).  CSW Collaboration with Daughter, Ganon Demasi to Confirm Receipt & Thoroughly Review The Following List of Resources, Emailed ([email or web address]) on 01/02/2023:       ~ Assisted Living Facilities in [place], [place], [place], [employer] &              [facility].       ~ Extended [employer] in [place], [place], [place], [facility] &              [facility].      ~ Memory Care Assisted Living Facilities in [place], [place], [place],          [employer] & [place].      ~ Skilled Nursing Facilities in [place], [place], [place], [facility] &          [place].  CSW Collaboration with Daughter, Marquon Alcala to Encourage Review of The Following List of Resources, Emailed ([email or web address]) on 01/13/2023:       ~ How to Apply for Aid & Attendance Benefits - [employer].      ~ Aid & Attendance Benefits  Application - Merchandiser, Retail.       ~ Associate Professor to [employer] - Merchandiser, Retail.       ~ Advice Worker - Merchandiser, Retail.       ~ Public Librarian in Building Surveyor of Claim - Merchandiser, Retail.      ~ Personal Assistant.      ~ Step-By-Step Instructions on  How to Apply for Medicaid On-Line.      ~ 2024 Medicaid Income Guidelines & Reserve Licensed Conveyancer.      ~ Making Medicaid Accessible Through [facility].      ~ Aging, Disability & Transit Services of [facility].       ~ [facility].      ~ Magazine Features Editor Resources in [place].       ~ Designer, Industrial/product (Living Will Solicitor).       ~ Instructions on How to File for Legal Guardianship.      ~ Guardianship Application.      ~ [employer] Brochure. CSW Collaboration with Daughter, Navarro Nine to Encourage Routine Engagement with Laylynn Campanella, Licensed Clinical Social Worker with [facility] [phone]), if She Has Questions, Needs Assistance, or If Additional Social Work Needs Are Identified Between Now & Our Next Follow-Up Outreach Call, Scheduled on 01/27/2023 at 1:45 PM.      SDOH assessments and interventions completed:  Yes.  Care Coordination Interventions:  Yes, provided.   Follow up plan: Follow up call scheduled for 01/27/2023 at 1:45 pm.  Encounter Outcome:  Patient Visit Completed.   Philippe Desanctis, BSW, MSW, Printmaker Social Work Case Set Designer Health  [facility], Population Health Direct Dial: [phone]  Fax: [phone] Email: [email or web address]: [email or web address]

## 2023-01-15 NOTE — Progress Notes (Signed)
   01/15/23 1300  ReDS Vest / Clip  Station Marker D  Ruler Value 36   Patient's Reds Clip showed low quality. 3 attempts made to correct.

## 2023-01-15 NOTE — Significant Event (Addendum)
 Stroke alert called by RN for slurred speech. LKWT ~345 AM (prior to admin hydroxyzine  - given 10 mg for generalized pruritis). CBG is wnl. VS wnl. On interview noted to have stuttering speech without aphasia. S-s-s-s-something is wrong. On exam stuttering  but no aphasia, CN II-XII intact. Motor with 4/5 weakness in the LUE, and 4/5 in bialteral LE. 5/5 in the RUE. Sensation is intact and equal to fine touch.   A/P   Suspect adverse effect of hydroxyzine   Acute onset of stuttering speech without aphasia  - OK to cancel code stroke, considering this acute change in speech pattern will ask neuro to consult. Would like to hold off on CT Head for now, do not think needed given likely medication AE.  - Avoid hydroxyzine   - Hold Gabapentin , Hydrocodone  for now  - Serial neurochecks   Dorn Dawson, MD  [facility]

## 2023-01-15 NOTE — [place] FL2 (Signed)
 [place]  MEDICAID FL2 LEVEL OF CARE FORM     IDENTIFICATION  Patient Name: [name] Birthdate: January 13, 1955 Sex: male Admission Date (Current Location): 01/14/2023  [facility] and [place] Number:  [employer] and Address:  [facility],  [address] [place] [address]      Provider Number: [id number]  Attending Physician Name and Address:  Maree Adron BIRCH, DO  Relative Name and Phone Number:       Current Level of Care: Hospital Recommended Level of Care: Skilled Nursing Facility Prior Approval Number:    Date Approved/Denied:   PASRR Number: [id number]  Discharge Plan: SNF    Current Diagnoses: Patient Active Problem List   Diagnosis Date Noted   Acute on chronic combined systolic (congestive) and diastolic (congestive) heart failure (HCC) 01/14/2023   Frequent falls 01/14/2023   Diabetic nephropathy (HCC) 01/13/2023   Hidradenitis 01/13/2023   Hyperlipidemia, mixed 01/13/2023   Obesity 01/13/2023   Opioid dependence (HCC) 01/13/2023   Old anterior cruciate ligament disruption 01/13/2023   Spermatocele 01/13/2023   Spinal stenosis of lumbar region 01/13/2023   Varicocele 01/13/2023   Left ventricular thrombus 01/13/2023   Stimulant abuse (HCC) 01/13/2023   Chronic low back pain 01/13/2023   Hydrocele in adult 01/13/2023   CHF (congestive heart failure) (HCC) 01/13/2023   Osteoarthritis of knees, bilateral 01/13/2023   Chronic kidney disease, stage 3b (HCC) 12/21/2022   Perianal abscess 12/19/2022   Adenomatous polyp of transverse colon 11/11/2022   H/O medication noncompliance 11/09/2022   Pleural effusion 11/10/2021   Mediastinal lymphadenopathy 11/09/2021   Stroke (cerebrum) (HCC) 11/07/2021   Protein calorie malnutrition (HCC) 11/07/2021   Scrotal pain; chronic, bilateral 07/05/2021   Microscopic hematuria 07/05/2021   Organic impotence 07/05/2021   Opiate overdose (HCC)    Uncontrolled type 2 diabetes mellitus  with hyperglycemia (HCC) 05/16/2019   Cocaine abuse (HCC) 05/15/2019   Apical mural thrombus 10/15/2018   Claudication in peripheral vascular disease (HCC) 11/30/2017   PAD (peripheral artery disease) (HCC) 11/28/2017   Acute kidney injury superimposed on chronic kidney disease (HCC) 01/31/2016   Cocaine use 09/04/2015   Essential hypertension 06/29/2015   Status post coronary artery stent placement 06/29/2015   Insulin  dependent diabetes mellitus 06/29/2015   History of MI (myocardial infarction) 06/29/2015   OSA on CPAP 01/21/2015   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 09/09/2013   Coronary artery disease 09/09/2013   Ischemic cardiomyopathy 09/09/2013   S/P LAD DES June 2014 09/06/2013   Tobacco use disorder 09/06/2013   Hypertensive heart disease     Orientation RESPIRATION BLADDER Height & Weight     Self, Time, Situation, Place  Normal Continent Weight: 216 lb 14.4 oz (98.4 kg) Height:  5' 9 (175.3 cm)  BEHAVIORAL SYMPTOMS/MOOD NEUROLOGICAL BOWEL NUTRITION STATUS      Continent Diet (see dc summary)  AMBULATORY STATUS COMMUNICATION OF NEEDS Skin   Extensive Assist Verbally Normal                       Personal Care Assistance Level of Assistance  Bathing, Feeding, Dressing Bathing Assistance: Limited assistance Feeding assistance: Independent Dressing Assistance: Limited assistance     Functional Limitations Info  Sight, Hearing, Speech Sight Info: Adequate Hearing Info: Adequate Speech Info: Adequate    SPECIAL CARE FACTORS FREQUENCY  PT (By licensed PT), OT (By licensed OT)     PT Frequency: 5x week OT Frequency: 5x week  Contractures Contractures Info: Not present    Additional Factors Info  Code Status, Allergies Code Status Info: Full Allergies Info: Colpidogrel           Current Medications (01/15/2023):  This is the current hospital active medication list Current Facility-Administered Medications  Medication Dose  Route Frequency Provider Last Rate Last Admin   acetaminophen  (TYLENOL ) tablet 650 mg  650 mg Oral Q6H PRN Emokpae, Ejiroghene E, MD       Or   acetaminophen  (TYLENOL ) suppository 650 mg  650 mg Rectal Q6H PRN Emokpae, Ejiroghene E, MD       aspirin  EC tablet 81 mg  81 mg Oral Daily Emokpae, Ejiroghene E, MD   81 mg at 01/15/23 9177   atorvastatin  (LIPITOR ) tablet 40 mg  40 mg Oral QHS Emokpae, Ejiroghene E, MD   40 mg at 01/14/23 2025   [START ON 01/16/2023] ferrous sulfate  tablet 325 mg  325 mg Oral Q breakfast Maree, Pratik D, DO       folic acid  (FOLVITE ) tablet 1 mg  1 mg Oral Daily Maree, Pratik D, DO       furosemide  (LASIX ) injection 40 mg  40 mg Intravenous Q12H Emokpae, Ejiroghene E, MD   40 mg at 01/15/23 9178   heparin  injection 5,000 Units  5,000 Units Subcutaneous Q8H Emokpae, Ejiroghene E, MD   5,000 Units at 01/15/23 0522   insulin  aspart (novoLOG ) injection 0-5 Units  0-5 Units Subcutaneous QHS Emokpae, Ejiroghene E, MD       insulin  aspart (novoLOG ) injection 0-9 Units  0-9 Units Subcutaneous TID WC Emokpae, Ejiroghene E, MD   1 Units at 01/15/23 9177   linaclotide  (LINZESS ) capsule 145 mcg  145 mcg Oral QAC breakfast Emokpae, Ejiroghene E, MD   145 mcg at 01/15/23 9177   midodrine  (PROAMATINE ) tablet 10 mg  10 mg Oral TID WC Emokpae, Ejiroghene E, MD   10 mg at 01/15/23 9178   ondansetron  (ZOFRAN ) tablet 4 mg  4 mg Oral Q6H PRN Emokpae, Ejiroghene E, MD       Or   ondansetron  (ZOFRAN ) injection 4 mg  4 mg Intravenous Q6H PRN Emokpae, Ejiroghene E, MD       pantoprazole  (PROTONIX ) EC tablet 40 mg  40 mg Oral Daily Emokpae, Ejiroghene E, MD   40 mg at 01/15/23 0821   polyethylene glycol (MIRALAX  / GLYCOLAX ) packet 17 g  17 g Oral Daily PRN Emokpae, Ejiroghene E, MD         Discharge Medications: Please see discharge summary for a list of discharge medications.  Relevant Imaging Results:  Relevant Lab Results:   Additional Information SSN: [id number] [address]  [place]

## 2023-01-16 DIAGNOSIS — F1410 Cocaine abuse, uncomplicated: Secondary | ICD-10-CM

## 2023-01-16 DIAGNOSIS — R296 Repeated falls: Secondary | ICD-10-CM

## 2023-01-16 DIAGNOSIS — I10 Essential (primary) hypertension: Secondary | ICD-10-CM

## 2023-01-16 DIAGNOSIS — N189 Chronic kidney disease, unspecified: Secondary | ICD-10-CM

## 2023-01-16 DIAGNOSIS — N179 Acute kidney failure, unspecified: Secondary | ICD-10-CM

## 2023-01-16 DIAGNOSIS — E1149 Type 2 diabetes mellitus with other diabetic neurological complication: Secondary | ICD-10-CM

## 2023-01-16 LAB — CBC
HCT: 24.8 % — ABNORMAL LOW (ref 39.0–52.0)
Hemoglobin: 6.9 g/dL — CL (ref 13.0–17.0)
MCH: 21.6 pg — ABNORMAL LOW (ref 26.0–34.0)
MCHC: 27.8 g/dL — ABNORMAL LOW (ref 30.0–36.0)
MCV: 77.7 fL — ABNORMAL LOW (ref 80.0–100.0)
Platelets: 285 10*3/uL (ref 150–400)
RBC: 3.19 MIL/uL — ABNORMAL LOW (ref 4.22–5.81)
RDW: 23.5 % — ABNORMAL HIGH (ref 11.5–15.5)
WBC: 16.5 10*3/uL — ABNORMAL HIGH (ref 4.0–10.5)
nRBC: 0.6 % — ABNORMAL HIGH (ref 0.0–0.2)

## 2023-01-16 LAB — BASIC METABOLIC PANEL
Anion gap: 7 (ref 5–15)
BUN: 36 mg/dL — ABNORMAL HIGH (ref 8–23)
CO2: 22 mmol/L (ref 22–32)
Calcium: 7.9 mg/dL — ABNORMAL LOW (ref 8.9–10.3)
Chloride: 105 mmol/L (ref 98–111)
Creatinine, Ser: 2.14 mg/dL — ABNORMAL HIGH (ref 0.61–1.24)
GFR, Estimated: 33 mL/min — ABNORMAL LOW (ref >60)
Glucose, Bld: 106 mg/dL — ABNORMAL HIGH (ref 70–99)
Potassium: 4.6 mmol/L (ref 3.5–5.1)
Sodium: 134 mmol/L — ABNORMAL LOW (ref 135–145)

## 2023-01-16 LAB — PREPARE RBC (CROSSMATCH)

## 2023-01-16 LAB — GLUCOSE, CAPILLARY
Glucose-Capillary: 108 mg/dL — ABNORMAL HIGH (ref 70–99)
Glucose-Capillary: 155 mg/dL — ABNORMAL HIGH (ref 70–99)
Glucose-Capillary: 177 mg/dL — ABNORMAL HIGH (ref 70–99)
Glucose-Capillary: 194 mg/dL — ABNORMAL HIGH (ref 70–99)

## 2023-01-16 LAB — MAGNESIUM: Magnesium: 2 mg/dL (ref 1.7–2.4)

## 2023-01-16 MED ORDER — SODIUM CHLORIDE 0.9% IV SOLUTION: Freq: Once | INTRAVENOUS | Status: AC

## 2023-01-16 NOTE — Progress Notes (Signed)
 PROGRESS NOTE    BRENNYN [name]  FMW:[id number] DOB: 1955/06/24 DOA: 01/14/2023 PCP: Carlette Benita Area, MD   Brief Narrative:    [name] is a 68 y.o. male with medical history significant for congestive heart failure, coronary artery disease, diabetes mellitus, hypertension, stroke, LV thrombus, cocaine abuse. Patient presented to the ED with complaints of bilateral lower extremity swelling this has been going on for several months, he reports last night swelling got worse, with pain involving the heels of both of his legs.  Patient was admitted with acute on chronic combined systolic and diastolic CHF exacerbation along with some AKI.  He is also noted to have frequent falls and overnight was starting to have some slurred speech after being given hydroxyzine , but this has now improved.  PT now recommending SNF placement.  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  BNP elevated at 1193. With AKI on CKD.  Reports compliance with torsemide  20 mg daily.  Recent echo 12/2022 EF 30 to 35%. -IV Lasix  40 mg twice daily for another 24 hours. -Checked input output, daily weight, daily BMP -Trops x 2 -Patient has been emphasized need for Reds clip monitoring and urine output monitoring, in order for him to collaborate with nursing staff for this collection.   Acute kidney injury superimposed on chronic kidney disease (HCC) AKI on  CKD stage IIIa.  Creatinine 2.1, recent baseline 1.3-1.4.  In the setting of decompensated CHF. -Monitor closely with diuresis, currently downtrending -Continue to follow renal function trend.  Acute metabolic encephalopathy-resolved -Slurred speech likely due to hydroxyzine  use -No need for further imaging at this time -Continue supportive care.  Patient mentation back to baseline.   Frequent falls -Daughter reports frequent falls.  Requesting placement. - PT recommending SNF placement -TOC assisting with placement.   Coronary artery disease -EKG unchanged. -Patient denies chest pain.   Essential hypertension Blood pressure now soft.  On carvedilol  and midodrine . -Continue to hold carvedilol  for now to allow for diuresis -Continue the use of midodrine , started during recent hospitalization -Follow vital signs.   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  A1c 6.3. - SSI- S and the use of long-acting insulin . -Continue to hold Jardiance  -Modified carbohydrate diet discussed with patient.    Cocaine abuse (HCC) - uds pos for opiates -Patient advised for complete abstinence regarding recreational drug usage.  Obesity, class I -Body mass index is 31.84 kg/m. -Low-calorie diet and portion control discussed with patient.  Anemia of chronic disease -No overt bleeding appreciated -Holding heparin  products and using SCDs -1 unit PRBCs will be provided -Follow hemoglobin trend.  DVT prophylaxis:Heparin  Code Status: Full Family Communication: No family at bedside. Disposition Plan:  Status is: Inpatient Remains inpatient appropriate because: Need for IV medications for diuresis.   Consultants:  TeleNeurology  Procedures:  None  Antimicrobials:  None   Subjective: Patient reports no chest pain, no nausea vomiting.  Good urine output appreciated. Objective: Vitals:  01/16/23 0500 01/16/23 1125 01/16/23 1143 01/16/23 1408  BP:  95/72 107/71 98/63   Pulse:  84 89 81  Resp:  16 18 18   Temp:  98.1 F (36.7 C) 97.8 F (36.6 C) 97.9 F (36.6 C)  TempSrc:  Axillary Axillary Axillary  SpO2:  100% 95% 95%  Weight: 97.8 kg     Height:        Intake/Output Summary (Last 24 hours) at 01/16/2023 1831 Last data filed at 01/16/2023 1700 Gross per 24 hour  Intake 1114.5 ml  Output --  Net 1114.5 ml   Filed Weights   01/14/23 1422 01/15/23 0353 01/16/23 0500  Weight: 106.6 kg 98.4 kg 97.8 kg    Examination: General exam: Afebrile, no chest pain, no nausea or vomiting.  Overnight complaining of being hungry and not being adequately treated by nursing staff. Respiratory system: Improved air movement bilaterally; no wheezing, decreased breath sounds at the bases appreciated.  Good saturation on room air. Cardiovascular system: Rate controlled, no rubs, no gallops. Gastrointestinal system: Abdomen is obese, nondistended, soft and nontender. No organomegaly or masses felt. Normal bowel sounds heard. Central nervous system: Generally weak.  No focal neurological deficits. Extremities: No cyanosis or clubbing.  1+ edema appreciated bilaterally. Skin: No petechiae. Psychiatry: Judgement and insight appear normal.  Flat affect appreciated on exam.   Data Reviewed: I have personally reviewed following labs and imaging studies  CBC: Recent Labs  Lab 01/14/23 1422 01/15/23 0508 01/16/23 0631  WBC 12.1* 12.4* 16.5*  HGB 7.9* 7.3* 6.9*  HCT 29.9* 28.2* 24.8*  MCV 79.9* 79.7* 77.7*  PLT 329 308 285   Basic Metabolic Panel: Recent Labs  Lab 01/14/23 1422 01/15/23 0508 01/16/23 0631  NA 142 140 134*  K 4.3 4.4 4.6  CL 112* 110 105  CO2 22 22 22   GLUCOSE 158* 98 106*  BUN 31* 30* 36*  CREATININE 1.92* 1.71* 2.14*  CALCIUM  8.4* 8.5* 7.9*  MG 2.2  --  2.0   GFR: Estimated Creatinine Clearance: 38.1 mL/min (A) (by C-G formula based on SCr of 2.14 mg/dL (H)).  CBG: Recent Labs  Lab 01/15/23 1617 01/15/23 2151 01/16/23 0728  01/16/23 1122 01/16/23 1618  GLUCAP 122* 93 108* 155* 177*    Radiology Studies: No results found.  Scheduled Meds:  aspirin  EC  81 mg Oral Daily   atorvastatin   40 mg Oral QHS   ferrous sulfate   325 mg Oral Q breakfast   folic acid   1 mg Oral Daily   furosemide   40 mg Intravenous Q12H   insulin  aspart  0-5 Units Subcutaneous QHS   insulin  aspart  0-9 Units Subcutaneous TID WC   linaclotide   145 mcg Oral QAC breakfast   midodrine   10 mg Oral TID WC   pantoprazole   40 mg Oral Daily     LOS: 2 days    Time spent: 55 minutes    Eric Nunnery, MD [facility]  If 7PM-7AM, please contact night-coverage [email or web address] 01/16/2023, 6:31 PM

## 2023-01-16 NOTE — Plan of Care (Signed)
 Pt is alert and oriented x 4. Forgetful at times. Vitals stable. Up to bathroom with 1 assist or uses urinal. Has urgency incontinence at times. Tylenol  given for pain in lower back buttocks.  Problem: Education: Goal: Knowledge of General Education information will improve Description: Including pain rating scale, medication(s)/side effects and non-pharmacologic comfort measures Outcome: Progressing   Problem: Health Behavior/Discharge Planning: Goal: Ability to manage health-related needs will improve Outcome: Progressing   Problem: Clinical Measurements: Goal: Ability to maintain clinical measurements within normal limits will improve Outcome: Progressing Goal: Will remain free from infection Outcome: Progressing Goal: Diagnostic test results will improve Outcome: Progressing Goal: Respiratory complications will improve Outcome: Progressing Goal: Cardiovascular complication will be avoided Outcome: Progressing   Problem: Activity: Goal: Risk for activity intolerance will decrease Outcome: Progressing   Problem: Nutrition: Goal: Adequate nutrition will be maintained Outcome: Progressing   Problem: Coping: Goal: Level of anxiety will decrease Outcome: Progressing   Problem: Elimination: Goal: Will not experience complications related to bowel motility Outcome: Progressing Goal: Will not experience complications related to urinary retention Outcome: Progressing   Problem: Pain Management: Goal: General experience of comfort will improve Outcome: Progressing   Problem: Safety: Goal: Ability to remain free from injury will improve Outcome: Progressing   Problem: Skin Integrity: Goal: Risk for impaired skin integrity will decrease Outcome: Progressing   Problem: Education: Goal: Ability to demonstrate management of disease process will improve Outcome: Progressing Goal: Ability to verbalize understanding of medication therapies will improve Outcome:  Progressing Goal: Individualized Educational Video(s) Outcome: Progressing   Problem: Activity: Goal: Capacity to carry out activities will improve Outcome: Progressing   Problem: Cardiac: Goal: Ability to achieve and maintain adequate cardiopulmonary perfusion will improve Outcome: Progressing   Problem: Education: Goal: Ability to describe self-care measures that may prevent or decrease complications (Diabetes Survival Skills Education) will improve Outcome: Progressing Goal: Individualized Educational Video(s) Outcome: Progressing   Problem: Coping: Goal: Ability to adjust to condition or change in health will improve Outcome: Progressing   Problem: Fluid Volume: Goal: Ability to maintain a balanced intake and output will improve Outcome: Progressing   Problem: Health Behavior/Discharge Planning: Goal: Ability to identify and utilize available resources and services will improve Outcome: Progressing Goal: Ability to manage health-related needs will improve Outcome: Progressing   Problem: Metabolic: Goal: Ability to maintain appropriate glucose levels will improve Outcome: Progressing   Problem: Nutritional: Goal: Maintenance of adequate nutrition will improve Outcome: Progressing Goal: Progress toward achieving an optimal weight will improve Outcome: Progressing   Problem: Skin Integrity: Goal: Risk for impaired skin integrity will decrease Outcome: Progressing   Problem: Tissue Perfusion: Goal: Adequacy of tissue perfusion will improve Outcome: Progressing

## 2023-01-16 NOTE — Consult Note (Signed)
  [facility] Liaison Consult Note    01/16/2023  SHAUNN TACKITT January 06, 1956 [id number]  Primary Care Provider:  Dr. Benita Outhouse   Patient is currently active with Care Management for chronic disease management services.  Patient has been engaged by a presenter, broadcasting and social with with [facility].  Our community based plan of care has focused on disease management and community resource support.   Patient will receive a post hospital call and will be evaluated for assessments and disease process education.   Plan: Currently pending SNF level of care   Inpatient Transition Of Care [TOC] team member  was not assigned to make aware that Care Management following.  Of note, Care Management services does not replace or interfere with any services that are needed or arranged by inpatient [facility] care management team.   For additional questions or referrals please contact:   Olam Ku, RN, [facility] Liaison [place]   [facility], Population Health Office Hours MTWF  8:00 am-6:00 pm Direct Dial: [phone] mobile [phone] [Office toll free line] Office Hours are M-F 8:30 - 5 pm [email or web address]

## 2023-01-16 NOTE — Progress Notes (Addendum)
 Pt  argumentative  to staff this am. Pt stated he had soup and staffed need to get the soup. Pt informed no soup was on the unit. He stated I'm in a hospital and I know you have soup. Informed pt no soup. Only crackers, graham crackers and peanut butter. The hospital will have more food once the kitchen it open for breakfast this am. Pt stating my belly is hurting from hunger. Pt informed he's diabetic and heart failure. He has had grahams, peanut butter and diet ginergale every hour to hour and half during the night. Informed pt he has just received crackers and drink approx 40 min ago. No grahams now on unit. Pt stated I didn't ask you what you gave me. I said get me food Pt said Get the phone and call this number. RN attempted to give pt phone. Pt stated I'm not calling this number. You can do it. RN called number. Pt informed person on phone that he wanted to know where the soup was. Person informed pt they had took it home. Pt said he was having hunger pains. Person on phone said get some food from hospital. Person on phone wanted to speak with RN. The person on phone informed pt had ate almost every hour. Currently we have no grahams. Only crackers and peanut butter and pt was aware but doesn't want it. The person on the phone said give the crackers and gingerale and tell him to be quite. After getting off phone pt informed he still needed his IV replaced since he removed at 0400 and RN had medicated for labs and IV placement. RN the clinical research associate informed pt could place quickly. Pt stated The quicker you get me food the quicker you can put an IV in Pt informed its important to have an IV in case of emergency. Pt stated again The quicker you get me food the quicker you can put an IV in Lab came to draw labs. Lab informed when pt allows can collect labs since pt has increased anxiety with needle sticks. Crackers, and diet gingerale provided to pt. Per family request. Charge RN aware of situation and she  went to speak with pt regarding behavior to staff. Pt said staff have not helped him. Charge RN informed pt that food provided. Vitals taken but pt has refused to keep tele on and refused to have IV inserted until he got food.

## 2023-01-16 NOTE — Progress Notes (Signed)
   01/16/23 9344  Provider Notification  Provider Name/Title JINNY Dawson MD  Date Provider Notified 01/16/23  Time Provider Notified [phone]  Method of Notification Page  Notification Reason Critical Result (Hemoglobin 6.9)  Provider response See new orders  Date of Provider Response 01/16/23  Time of Provider Response [phone]

## 2023-01-16 NOTE — TOC Progression Note (Signed)
 Transition of Care [facility]) - Progression Note    Patient Details  Name: [name] MRN: [id number] Date of Birth: 03/04/1955  [facility] CM/SW Contact  Lucie Lunger, [place] Phone Number: 01/16/2023, 2:26 PM  Clinical Narrative:    CSW spoke with pts daughter to review bed offer from [facility]. She accepts bed offer at this time. CSW spoke to Debbie to provide update, [facility] states they can take pt under his medicare benefits not [place]. CSW spoke with pts daughter about this, she is understanding and agreeable. TOC to follow.   Expected Discharge Plan: Skilled Nursing Facility Barriers to Discharge: Continued Medical Work up  Expected Discharge Plan and Services In-house Referral: Clinical Social Work   Post Acute Care Choice: Skilled Nursing Facility Living arrangements for the past 2 months: Single Family Home                                       Social Determinants of Health (SDOH) Interventions SDOH Screenings   Food Insecurity: No Food Insecurity (01/14/2023)  Housing: Low Risk  (01/14/2023)  Transportation Needs: No Transportation Needs (01/14/2023)  Utilities: Not At Risk (01/14/2023)  Alcohol  Screen: Low Risk  (12/14/2022)  Depression (PHQ2-9): Low Risk  (12/14/2022)  Financial Resource Strain: Low Risk  (12/14/2022)  Physical Activity: Inactive (12/14/2022)  Social Connections: Moderately Integrated (01/14/2023)  Stress: Stress Concern Present (12/14/2022)  Tobacco Use: High Risk (01/14/2023)  Health Literacy: Adequate Health Literacy (12/14/2022)    Readmission Risk Interventions    12/19/2022   10:56 AM 11/04/2022   10:22 AM 10/21/2022    1:05 PM  Readmission Risk Prevention Plan  Transportation Screening Complete Complete Complete  HRI or Home Care Consult  Complete Complete  Social Work Consult for Recovery Care Planning/Counseling  Complete Complete  Palliative Care Screening  Not Applicable Not Applicable  Medication Review Furniture Conservator/restorer) Complete Complete Complete  HRI or Home Care Consult Complete    SW Recovery Care/Counseling Consult Complete    Palliative Care Screening Not Applicable    Skilled Nursing Facility Not Applicable

## 2023-01-17 ENCOUNTER — Ambulatory Visit: Payer: Self-pay | Admitting: *Deleted

## 2023-01-17 ENCOUNTER — Encounter: Payer: Self-pay | Admitting: *Deleted

## 2023-01-17 DIAGNOSIS — I2510 Atherosclerotic heart disease of native coronary artery without angina pectoris: Secondary | ICD-10-CM

## 2023-01-17 LAB — CBC
HCT: 26 % — ABNORMAL LOW (ref 39.0–52.0)
Hemoglobin: 7.3 g/dL — ABNORMAL LOW (ref 13.0–17.0)
MCH: 21.6 pg — ABNORMAL LOW (ref 26.0–34.0)
MCHC: 28.1 g/dL — ABNORMAL LOW (ref 30.0–36.0)
MCV: 76.9 fL — ABNORMAL LOW (ref 80.0–100.0)
Platelets: 274 10*3/uL (ref 150–400)
RBC: 3.38 MIL/uL — ABNORMAL LOW (ref 4.22–5.81)
RDW: 22.5 % — ABNORMAL HIGH (ref 11.5–15.5)
WBC: 14.3 10*3/uL — ABNORMAL HIGH (ref 4.0–10.5)
nRBC: 0.6 % — ABNORMAL HIGH (ref 0.0–0.2)

## 2023-01-17 LAB — BPAM RBC
Blood Product Expiration Date: 202501272359
ISSUE DATE / TIME: 202501061111
Unit Type and Rh: 6200

## 2023-01-17 LAB — BASIC METABOLIC PANEL
Anion gap: 7 (ref 5–15)
BUN: 40 mg/dL — ABNORMAL HIGH (ref 8–23)
CO2: 22 mmol/L (ref 22–32)
Calcium: 7.9 mg/dL — ABNORMAL LOW (ref 8.9–10.3)
Chloride: 104 mmol/L (ref 98–111)
Creatinine, Ser: 2 mg/dL — ABNORMAL HIGH (ref 0.61–1.24)
GFR, Estimated: 36 mL/min — ABNORMAL LOW (ref >60)
Glucose, Bld: 103 mg/dL — ABNORMAL HIGH (ref 70–99)
Potassium: 4.2 mmol/L (ref 3.5–5.1)
Sodium: 133 mmol/L — ABNORMAL LOW (ref 135–145)

## 2023-01-17 LAB — GLUCOSE, CAPILLARY
Glucose-Capillary: 139 mg/dL — ABNORMAL HIGH (ref 70–99)
Glucose-Capillary: 234 mg/dL — ABNORMAL HIGH (ref 70–99)

## 2023-01-17 LAB — TYPE AND SCREEN
ABO/RH(D): A POS
Antibody Screen: NEGATIVE
Unit division: 0

## 2023-01-17 MED ORDER — TORSEMIDE 20 MG PO TABS: 40.0000 mg | ORAL_TABLET | Freq: Every day | ORAL | Status: DC

## 2023-01-17 MED ORDER — TORSEMIDE 20 MG PO TABS
40.0000 mg | ORAL_TABLET | Freq: Every day | ORAL | Status: DC
Start: 2023-01-17 — End: 2023-01-17
  Administered 2023-01-17: 40 mg via ORAL
  Filled 2023-01-17: qty 2

## 2023-01-17 MED ORDER — POLYETHYLENE GLYCOL 3350 17 G PO PACK: 17.0000 g | PACK | Freq: Every day | ORAL | Status: DC | PRN

## 2023-01-17 NOTE — Discharge Summary (Signed)
 Physician Discharge Summary   Patient: [name] MRN: [id number] DOB: 03-24-1955  Admit date:     01/14/2023  Discharge date: 01/17/23  Discharge Physician: Eric Nunnery   PCP: Carlette Benita Area, MD   Recommendations at discharge:  Repeat CBC to follow hemoglobin trend Repeat basic metabolic panel to follow electrolytes and renal function Reassess patient volume status and blood pressure with further adjustment to antihypertensive regimen and diuretics as needed. Continue to follow CBGs fluctuation with further adjustment to hypoglycemic regimen as required.  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 male with medical history significant for congestive heart failure, coronary artery disease, diabetes mellitus, hypertension, stroke, LV thrombus, cocaine abuse. Patient presented to the ED with complaints of bilateral lower extremity swelling this has been going on for several months, he reports last night swelling got worse, with pain involving the heels of both of his legs.  Patient was admitted with acute on chronic combined systolic and diastolic CHF exacerbation along with some AKI.  He is also noted to have frequent falls and overnight was starting to have some slurred speech after being given hydroxyzine , but this has now improved.  PT now recommending SNF placement.  Assessment and Plan: Acute on chronic combined systolic (congestive) and diastolic (congestive) heart failure (HCC) 2+ pitting bilateral lower extremity edema, chest x-ray with vascular congestion, currently on room air sats greater than 94%.  BNP  elevated at 1193. With AKI on CKD.  Reports compliance with torsemide  20 mg daily.  Recent echo 12/2022 EF 30 to 35%. -Diuretic has been transitioned to oral Demadex  40 mg daily. -Checked input output, daily weight, daily BMP -Trops x 2 -Patient has been encouraged to follow low-sodium diet, to check weight on daily basis and to be compliant with medication. -Maintain adequate hydration.   Acute kidney injury superimposed on chronic kidney disease (HCC) AKI on CKD stage IIIa.  Creatinine 2.0, recent baseline 1.3-1.4.  In the setting of decompensated CHF. -Monitor closely with diuresis, currently downtrending -Continue to follow renal function trend. -We might need to tolerate a slightly higher creatinine level in order and to control volume status.   Acute metabolic encephalopathy-resolved -Slurred speech likely due to hydroxyzine  use at time of admission. -No need for further imaging at this time. -Continue supportive care.   -Patient's mentation back to baseline. -No focal deficits appreciated.   Frequent falls -Daughter reports frequent falls.  Requesting placement. - PT recommending SNF placement -TOC assisting with placement; patient [facility] for [facility] care and rehabilitation.   Coronary artery disease -EKG unchanged. -Patient denies chest pain and shortness of breath at discharge.. -Continue patient follow-up with cardiology service.   Essential hypertension -Blood pressure has remained stable, but soft.   -Continue the use of carvedilol , adjusted dose of diuretics and the use of midodrine  -Low-sodium diet discussed with patient.   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  A1c 6.3. -Continue home hypoglycemic regimen. -Resume the use of Jardiance  at discharge. -Modified carbohydrate diet discussed with patient.    Cocaine abuse (HCC) -uds pos for opiates -Patient advised for complete abstinence  regarding recreational drug  usage.   Obesity, class I -Body mass index is 31.84 kg/m. -Low-calorie diet and portion control discussed with patient.   Anemia of chronic disease -No overt bleeding appreciated -1 unit PRBCs transfused -Continue to follow hemoglobin trend.  Consultants: None Procedures performed: See below for x-ray reports. Disposition: Skilled nursing facility Diet recommendation: Heart healthy/low-sodium diet and modified carbohydrates.  DISCHARGE MEDICATION: Allergies as of 01/17/2023       Reactions   Clopidogrel  Other (See Comments)   Drowsy, Skin irritation        Medication List     STOP taking these medications    furosemide  40 MG tablet Commonly known as: LASIX    HYDROcodone -acetaminophen  5-325 MG tablet Commonly known as: NORCO/VICODIN       TAKE these medications    aspirin  EC 81 MG tablet Commonly known as: Aspirin  Low Dose Take 1 tablet (81 mg total) by mouth daily. Swallow whole.   atorvastatin  40 MG tablet Commonly known as: LIPITOR  Take 1 tablet (40 mg total) by mouth at bedtime.   carvedilol  3.125 MG tablet Commonly known as: COREG  Take 1 tablet (3.125 mg total) by mouth 2 (two) times daily with a meal.   ferrous sulfate  325 (65 FE) MG tablet Take 1 tablet (325 mg total) by mouth daily with breakfast.   folic acid  1 MG tablet Commonly known as: FOLVITE  Take 1 tablet (1 mg total) by mouth daily.   gabapentin  300 MG capsule Commonly known as: NEURONTIN  Take 1 capsule (300 mg total) by mouth 3 (three) times daily.   insulin  glargine 100 UNIT/ML Solostar Pen Commonly known as: LANTUS  Inject 5 Units into the skin daily. What changed: when to take this   Jardiance  10 MG Tabs tablet Generic drug: empagliflozin  Take 10 mg by mouth daily.   linaclotide  145 MCG Caps capsule Commonly known as: LINZESS  Take 1 capsule (145 mcg total) by mouth daily before breakfast.   midodrine  10 MG tablet Commonly known as: PROAMATINE  Take 1 tablet (10 mg total)  by mouth 3 (three) times daily with meals.   pantoprazole  40 MG tablet Commonly known as: PROTONIX  Take 1 tablet (40 mg total) by mouth daily.   polyethylene glycol 17 g packet Commonly known as: MIRALAX  / GLYCOLAX  Take 17 g by mouth daily as needed for mild constipation.   torsemide  20 MG tablet Commonly known as: DEMADEX  Take 2 tablets (40 mg total) by mouth daily. What changed: how much to take        Contact information for follow-up providers     [name], Tesfaye Demissie, MD. Schedule an appointment as soon as possible for a visit in 10 day(s).   Specialty: Internal Medicine Contact information: [address] [place] [address] [phone]              Contact information for after-discharge care     Destination     [facility] .   Service: Skilled Nursing Contact information: [address] [place]  [address] [phone]                    Discharge Exam: Filed Weights   01/15/23 0353 01/16/23 0500 01/17/23 0500  Weight: 98.4 kg 97.8 kg 95.3 kg   General exam: Afebrile, no chest pain, no nausea or vomiting.  No shortness of breath or orthopnea. Respiratory system: Improved air movement bilaterally; no wheezing, decreased breath sounds at the bases appreciated.  No frank crackles  auscultated. Cardiovascular system: Rate controlled, no rubs, no gallops. Gastrointestinal system: Abdomen is obese, nondistended, soft and nontender. No organomegaly or masses felt. Normal bowel sounds heard. Central nervous system: Generally weak.  No focal neurological deficits. Extremities: No cyanosis or clubbing.  Trace to 1+ edema appreciated bilaterally. Skin: No petechiae. Psychiatry: Judgement and insight appear normal.  Flat affect appreciated on exam.  Condition at discharge: Stable and improved.  The results of significant diagnostics from this hospitalization (including imaging,  microbiology, ancillary and laboratory) are listed below for reference.   Imaging Studies: DG Foot Complete Left Result Date: 01/14/2023 CLINICAL DATA:  Bilateral foot pain and swelling. EXAM: LEFT FOOT - COMPLETE 3+ VIEW COMPARISON:  None Available. FINDINGS: The mineralization and alignment are normal. There is no evidence of acute fracture or dislocation. The joint spaces are preserved. There is mild spurring along the medial base of the 1st proximal phalanx. There is dorsal forefoot soft tissue swelling without foreign body or soft tissue emphysema. Prominent vascular calcifications are noted, suggesting underlying diabetes. IMPRESSION: Dorsal forefoot soft tissue swelling without evidence of acute osseous findings or foreign body. Prominent vascular calcifications suggesting underlying diabetes. Electronically Signed   By: Elsie Perone M.D.   On: 01/14/2023 15:21   DG Foot Complete Right Result Date: 01/14/2023 CLINICAL DATA:  Foot swelling and pain EXAM: RIGHT FOOT COMPLETE - 3+ VIEW COMPARISON:  None Available. FINDINGS: There is diffuse soft tissue swelling of the foot, particularly over the dorsum of the foot. There is no acute fracture or dislocation. No cortical erosions are seen. Peripheral vascular calcifications are present. Joint spaces are maintained. IMPRESSION: Diffuse soft tissue swelling of the foot, particularly over the dorsum of the foot. No acute fracture or dislocation. Electronically Signed   By: Greig Pique M.D.   On: 01/14/2023 15:18   DG Chest Portable 1 View Result Date: 01/14/2023 CLINICAL DATA:  Pain and swelling. EXAM: PORTABLE CHEST 1 VIEW COMPARISON:  12/18/2022. FINDINGS: Underinflation. Enlarged cardiopericardial silhouette with widened mediastinum. Vascular congestion. Small right pleural effusion. No pneumothorax. Film is under penetrated. Overlapping cardiac leads. Lordotic x-ray. IMPRESSION: Enlarged cardiopericardial silhouette with vascular congestion. Small  right pleural effusion. Under penetrated radiograph Electronically Signed   By: Ranell Bring M.D.   On: 01/14/2023 15:17   CT PELVIS WO CONTRAST Result Date: 12/24/2022 CLINICAL DATA:  Follow-up gluteal abscess.  Leukocytosis.  Sepsis. EXAM: CT PELVIS WITHOUT CONTRAST TECHNIQUE: Multidetector CT imaging of the pelvis was performed following the standard protocol without intravenous contrast. RADIATION DOSE REDUCTION: This exam was performed according to the departmental dose-optimization program which includes automated exposure control, adjustment of the mA and/or kV according to patient size and/or use of iterative reconstruction technique. COMPARISON:  12/18/2022 FINDINGS: Lower Urinary Tract: Unremarkable urinary bladder. Bowel: Unremarkable pelvic bowel loops. Vascular/Lymphatic: No pathologically enlarged lymph nodes or other significant abnormality. Reproductive:  No mass or other significant abnormality. Other: None. Musculoskeletal: No suspicious bone lesions identified. Previously seen fluid collection in the medial right buttock subcutaneous tissues adjacent to the gluteal crease has resolved. Persistent mild skin thickening and subcutaneous soft tissue stranding noted in this region. Previously seen smaller abscess posterior to the anal canal and along the gluteal crease has also resolved. IMPRESSION: Resolution of previously seen fluid collection in the medial right buttock subcutaneous tissues. Persistent mild skin thickening and subcutaneous soft tissue stranding in this region is consistent with cellulitis. Resolution of posterior perianal abscess. Electronically Signed   By: Norleen DELENA Kil M.D.   On:  12/24/2022 08:38   ECHOCARDIOGRAM LIMITED Result Date: 12/22/2022    ECHOCARDIOGRAM LIMITED REPORT   Patient Name:   FAIZAAN FALLS Date of Exam: 12/22/2022 Medical Rec #:  [id number]        Height:       69.0 in Accession #:    [id number]       Weight:       201.7 lb Date of Birth:  11-21-1955         BSA:          2.074 m Patient Age:    67 years         BP:           108/81 mmHg Patient Gender: M                HR:           77 bpm. Exam Location:  [facility] Procedure: Limited Echo and Intracardiac Opacification Agent Indications:    CHF- Acute Systolic l50.21  History:        Patient has prior history of Echocardiogram examinations, most                 recent 10/21/2022. CHF and Cardiomyopathy, CAD and Previous                 Myocardial Infarction, Stroke; Risk Factors:Hypertension,                 Diabetes, Dyslipidemia and Sleep Apnea.  Sonographer:    Aida Pizza RCS Referring Phys: [phone] DAVID TAT IMPRESSIONS  1. Left ventricular ejection fraction, by estimation, is 30 to 35%. The left ventricle has moderately decreased function. The left ventricle demonstrates regional wall motion abnormalities (see scoring diagram/findings for description). The left ventricular internal cavity size was mildly dilated. There is moderate asymmetric left ventricular hypertrophy of the septal segment. There is the interventricular septum is flattened in systole and diastole, consistent with right ventricular pressure and volume overload. No evidence of LV thrombus on contrast images.  2. Right ventricular systolic function is normal. The right ventricular size is normal. Tricuspid regurgitation signal is inadequate for assessing PA pressure.  3. The inferior vena cava is dilated in size with <50% respiratory variability, suggesting right atrial pressure of 15 mmHg. Comparison(s): Changes from prior study are noted. LVEF improved from 20-25% to 30-35%. FINDINGS  Left Ventricle: Left ventricular ejection fraction, by estimation, is 30 to 35%. The left ventricle has moderately decreased function. The left ventricle demonstrates regional wall motion abnormalities. Definity  contrast agent was given IV to delineate the left ventricular endocardial borders. The left ventricular internal cavity size was mildly dilated.  There is moderate asymmetric left ventricular hypertrophy of the septal segment. The interventricular septum is flattened in systole and diastole, consistent with right ventricular pressure and volume overload.  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 The entire anterior septum, apical lateral segment, mid inferoseptal segment, apical anterior segment, basal inferior segment, and apical inferior segment are akinetic. The anterior wall, basal anterolateral segment, mid inferior segment, and basal inferoseptal segment are hypokinetic. The posterior wall and mid anterolateral segment are normal. Right Ventricle: The right ventricular size is normal. No increase in right ventricular wall thickness. Right ventricular systolic function is normal. Tricuspid regurgitation signal is inadequate for assessing PA pressure. Left Atrium: Left atrial size was not assessed. Right Atrium: Right atrial size was not assessed. Pericardium: There is no evidence of pericardial effusion. Mitral Valve: The mitral valve  is normal in structure. Tricuspid Valve: The tricuspid valve is normal in structure. Aortic Valve: The aortic valve is normal in structure. Pulmonic Valve: The pulmonic valve was not well visualized. Aorta: The aortic root is normal in size and structure. Venous: The inferior vena cava is dilated in size with less than 50% respiratory variability, suggesting right atrial pressure of 15 mmHg. LEFT VENTRICLE PLAX 2D LVIDd:         5.85 cm LVIDs:         4.35 cm LV PW:         1.00 cm LV IVS:        1.30 cm LVOT diam:     2.00 cm LVOT Area:     3.14 cm  LV Volumes (MOD) LV vol d, MOD A2C: 149.0 ml LV vol d, MOD A4C: 125.0 ml LV vol s, MOD A2C: 104.0 ml LV vol s, MOD A4C: 89.9 ml LV SV MOD A2C:     45.0 ml LV SV MOD A4C:     125.0 ml LV SV MOD BP:      40.2 ml LEFT ATRIUM         Index LA diam:    4.60 cm 2.22 cm/m   AORTA Ao Root diam: 3.70 cm  SHUNTS Systemic Diam: 2.00 cm Vishnu Priya Mallipeddi Electronically signed by  Diannah Late Mallipeddi Signature Date/Time: 12/22/2022/5:16:42 PM    Final    US  EKG SITE RITE Result Date: 12/21/2022 If Site Rite image not attached, placement could not be confirmed due to current cardiac rhythm.  CT CHEST ABDOMEN PELVIS WO CONTRAST Result Date: 12/19/2022 CLINICAL DATA:  Fall, shortness of breath, buttock abscesses EXAM: CT CHEST, ABDOMEN AND PELVIS WITHOUT CONTRAST TECHNIQUE: Multidetector CT imaging of the chest, abdomen and pelvis was performed following the standard protocol without IV contrast. RADIATION DOSE REDUCTION: This exam was performed according to the departmental dose-optimization program which includes automated exposure control, adjustment of the mA and/or kV according to patient size and/or use of iterative reconstruction technique. COMPARISON:  CTA abdomen/pelvis dated 11/02/2022 FINDINGS: CT CHEST FINDINGS Cardiovascular: Cardiomegaly. No pericardial effusion. Hypodense blood pool relative to myocardium, suggesting anemia. No evidence of thoracic aortic aneurysm. Atherosclerotic calcifications of the arch. Severe three-vessel coronary atherosclerosis. Mediastinum/Nodes: Small mediastinal nodes, measuring up to 12 mm short axis in the right paratracheal region (series 2/image 14), previously 17 mm, likely reactive. Visualized thyroid  is unremarkable. Lungs/Pleura: Mosaic attenuation with mild scarring/atelectasis in the lungs bilaterally. No focal consolidation. No suspicious pulmonary nodules. No pleural effusion or pneumothorax. Musculoskeletal: Degenerative changes of the thoracic spine. Sternum, clavicles, and scapulae are intact. Bilateral ribs are intact. CT ABDOMEN PELVIS FINDINGS Hepatobiliary: Unenhanced liver is unremarkable. Layering tiny gallstones (series 2/image 58), without associated inflammatory changes. No intrahepatic or extrahepatic ductal dilatation. Pancreas: Within normal limits. Spleen: Within normal limits. Adrenals/Urinary Tract: Adrenal  glands are within normal limits. Kidneys are within normal limits.  No hydronephrosis. Bladder is within normal limits. Stomach/Bowel: Stomach is within normal limits. No evidence of bowel obstruction. Normal appendix/appendiceal stump (series 2/image 84). No colonic wall thickening or inflammatory changes. Vascular/Lymphatic: No evidence of abdominal aortic aneurysm. Atherosclerotic calcifications of the abdominal aorta and branch vessels. Left external iliac stent. No suspicious abdominopelvic lymphadenopathy. Reproductive: Prostate is unremarkable. Other: No abdominopelvic ascites. Musculoskeletal: 3.1 x 2.8 cm fluid collection with trace nondependent gas along the left medial gluteal cleft (series 2/image 122). 6.5 x 3.4 cm fluid collection inferiorly along the right medial gluteal cleft (series 2/image  135). These likely correspond to the patient's known buttock abscesses. Mild degenerative changes of the lumbar spine.  No fracture is seen. IMPRESSION: No traumatic injury to the chest, abdomen, or pelvis. Bilateral buttock abscesses, right greater than left. Additional ancillary findings as above. Electronically Signed   By: Pinkie Pebbles M.D.   On: 12/19/2022 00:19   DG Chest Port 1 View Result Date: 12/19/2022 CLINICAL DATA:  Questionable sepsis - evaluate for abnormality EXAM: PORTABLE CHEST 1 VIEW COMPARISON:  11/28/2022 FINDINGS: Cardiomegaly. Vascular congestion. Low lung volumes with bibasilar atelectasis. No overt edema or effusions. No acute bony abnormality. IMPRESSION: Cardiomegaly, vascular congestion. Low lung volumes with bibasilar atelectasis. Electronically Signed   By: Franky Crease M.D.   On: 12/19/2022 00:19   CT Cervical Spine Wo Contrast Result Date: 12/19/2022 CLINICAL DATA:  Fall.  Neck trauma (Age >= 65y) EXAM: CT CERVICAL SPINE WITHOUT CONTRAST TECHNIQUE: Multidetector CT imaging of the cervical spine was performed without intravenous contrast. Multiplanar CT image  reconstructions were also generated. RADIATION DOSE REDUCTION: This exam was performed according to the departmental dose-optimization program which includes automated exposure control, adjustment of the mA and/or kV according to patient size and/or use of iterative reconstruction technique. COMPARISON:  11/07/2021 FINDINGS: Alignment: Normal Skull base and vertebrae: No acute fracture. No primary bone lesion or focal pathologic process. Soft tissues and spinal canal: No prevertebral fluid or swelling. No visible canal hematoma. Disc levels: Mild diffuse degenerative disc disease and facet disease. Upper chest: No acute findings Other: None IMPRESSION: No acute bony abnormality. Electronically Signed   By: Franky Crease M.D.   On: 12/19/2022 00:14   CT HEAD WO CONTRAST ( ) Result Date: 12/19/2022 CLINICAL DATA:  Fall.  Head trauma, minor (Age >= 65y) EXAM: CT HEAD WITHOUT CONTRAST TECHNIQUE: Contiguous axial images were obtained from the base of the skull through the vertex without intravenous contrast. RADIATION DOSE REDUCTION: This exam was performed according to the departmental dose-optimization program which includes automated exposure control, adjustment of the mA and/or kV according to patient size and/or use of iterative reconstruction technique. COMPARISON:  11/28/2022, MRI 11/06/2022 FINDINGS: Brain: There is atrophy and chronic small vessel disease changes. Low-density/edema again noted in the right temporal lobe, unchanged. 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 No acute infarct, hemorrhage or hydrocephalus. Vascular: No hyperdense vessel or unexpected calcification. Skull: No acute calvarial abnormality. Sinuses/Orbits: No acute findings Other: None IMPRESSION: Stable edema within the right temporal lobe, possibly related to known right middle cranial fossa meningioma, not seen on this study. Old infarcts as above. Atrophy, chronic microvascular disease. No acute  intracranial abnormality. Electronically Signed   By: Franky Crease M.D.   On: 12/19/2022 00:13    Microbiology: Results for orders placed or performed during the hospital encounter of 12/18/22  Resp panel by RT-PCR (RSV, Flu A&B, Covid) Anterior Nasal Swab     Status: None   Collection Time: 12/18/22 11:44 PM   Specimen: Anterior Nasal Swab  Result Value Ref Range Status   SARS Coronavirus 2 by RT PCR NEGATIVE NEGATIVE Final    Comment: (NOTE) SARS-CoV-2 target nucleic acids are NOT DETECTED.  The SARS-CoV-2 RNA is generally detectable in upper respiratory specimens during the acute phase of infection. The lowest concentration of SARS-CoV-2 viral copies this assay can detect is 138 copies/mL. A negative result does not preclude SARS-Cov-2 infection and should not be used as the sole basis for treatment or other patient management decisions. A  negative result may occur with  improper specimen collection/handling, submission of specimen other than nasopharyngeal swab, presence of viral mutation(s) within the areas targeted by this assay, and inadequate number of viral copies(<138 copies/mL). A negative result must be combined with clinical observations, patient history, and epidemiological information. The expected result is Negative.  Fact Sheet for Patients:  bloggercourse.com  Fact Sheet for Healthcare Providers:  seriousbroker.it  This test is no t yet approved or cleared by the [place]  FDA and  has been authorized for detection and/or diagnosis of SARS-CoV-2 by FDA under an Emergency Use Authorization (EUA). This EUA will remain  in effect (meaning this test can be used) for the duration of the COVID-19 declaration under Section 564(b)(1) of the Act, 21 U.S.C.section 360bbb-3(b)(1), unless the authorization is terminated  or revoked sooner.       Influenza A by PCR NEGATIVE NEGATIVE Final   Influenza B by PCR  NEGATIVE NEGATIVE Final    Comment: (NOTE) The Xpert Xpress SARS-CoV-2/FLU/RSV plus assay is intended as an aid in the diagnosis of influenza from Nasopharyngeal swab specimens and should not be used as a sole basis for treatment. Nasal washings and aspirates are unacceptable for Xpert Xpress SARS-CoV-2/FLU/RSV testing.  Fact Sheet for Patients: bloggercourse.com  Fact Sheet for Healthcare Providers: seriousbroker.it  This test is not yet approved or cleared by the [place]  FDA and has been authorized for detection and/or diagnosis of SARS-CoV-2 by FDA under an Emergency Use Authorization (EUA). This EUA will remain in effect (meaning this test can be used) for the duration of the COVID-19 declaration under Section 564(b)(1) of the Act, 21 U.S.C. section 360bbb-3(b)(1), unless the authorization is terminated or revoked.     Resp Syncytial Virus by PCR NEGATIVE NEGATIVE Final    Comment: (NOTE) Fact Sheet for Patients: bloggercourse.com  Fact Sheet for Healthcare Providers: seriousbroker.it  This test is not yet approved or cleared by the [place]  FDA and has been authorized for detection and/or diagnosis of SARS-CoV-2 by FDA under an Emergency Use Authorization (EUA). This EUA will remain in effect (meaning this test can be used) for the duration of the COVID-19 declaration under Section 564(b)(1) of the Act, 21 U.S.C. section 360bbb-3(b)(1), unless the authorization is terminated or revoked.  Performed at [facility], [address], [place], [place] [address]   Blood Culture (routine x 2)     Status: Abnormal   Collection Time: 12/18/22 11:44 PM   Specimen: Right Antecubital; Blood  Result Value Ref Range Status   Specimen Description   Final    RIGHT ANTECUBITAL Performed at [facility], [address], [place], [place] [address]    Special Requests   Final     BOTTLES DRAWN AEROBIC AND ANAEROBIC Blood Culture adequate volume Performed at [facility], [address], [place], [place] [address]    Culture  Setup Time   Final    GRAM POSITIVE RODS AEROBIC BOTTLE ONLY CRITICAL RESULT CALLED TO, READ BACK BY AND VERIFIED WITH: ANGEL Ray at 1705 12/20/2022 by T KENNEDY PREVIOUSLY REPORTED AS: GRAM POSITIVE COCCI CORRECTED RESULTS CALLED TO: RN FREDRIK LAMA [phone] @ 2247 FH    Culture (A)  Final    CORYNEBACTERIUM AMYCOLATUM Standardized susceptibility testing for this organism is not available. Performed at [facility], [address], [place], [place] [address]    Report Status 12/23/2022 FINAL  Final  Blood Culture (routine x 2)     Status: Abnormal   Collection Time:  12/19/22 12:15 AM   Specimen: BLOOD LEFT HAND  Result Value Ref Range Status   Specimen Description   Final    BLOOD LEFT HAND Performed at [facility], [address] [place], [place] [address]    Special Requests   Final    BOTTLES DRAWN AEROBIC AND ANAEROBIC Blood Culture adequate volume Performed at [facility], [address], [place], [place] [address]    Culture  Setup Time   Final    GRAM POSITIVE COCCI AEROBIC BOTTLE Gram Stain Report Called to,Read Back By and Verified With: VILLALOBOS,S ON 12/19/22 AT 2300 BY LOY,C Organism ID to follow CRITICAL RESULT CALLED TO, READ BACK BY AND VERIFIED WITH: S VILLALOBOS,RN@0340  12/20/22 MK    Culture (A)  Final    STAPHYLOCOCCUS EPIDERMIDIS THE SIGNIFICANCE OF ISOLATING THIS ORGANISM FROM A SINGLE SET OF BLOOD CULTURES WHEN MULTIPLE SETS ARE DRAWN IS UNCERTAIN. PLEASE NOTIFY THE MICROBIOLOGY DEPARTMENT WITHIN ONE WEEK IF SPECIATION AND SENSITIVITIES ARE REQUIRED. Performed at [facility], [address] [place], [place] [address]    Report Status 12/22/2022 FINAL  Final  Blood Culture ID Panel (Reflexed)     Status: Abnormal   Collection Time: 12/19/22 12:15 AM  Result Value Ref Range Status    Enterococcus faecalis NOT DETECTED NOT DETECTED Final   Enterococcus Faecium NOT DETECTED NOT DETECTED Final   Listeria monocytogenes NOT DETECTED NOT DETECTED Final   Staphylococcus species DETECTED (A) NOT DETECTED Final    Comment: CRITICAL RESULT CALLED TO, READ BACK BY AND VERIFIED WITH: S VILLALOBOS,RN@0340  12/20/22 MK    Staphylococcus aureus (BCID) NOT DETECTED NOT DETECTED Final   Staphylococcus epidermidis DETECTED (A) NOT DETECTED Final    Comment: Methicillin (oxacillin) resistant coagulase negative staphylococcus. Possible blood culture contaminant (unless isolated from more than one blood culture draw or clinical case suggests pathogenicity). No antibiotic treatment is indicated for blood  culture contaminants. CRITICAL RESULT CALLED TO, READ BACK BY AND VERIFIED WITH: S VILLALOBOS,RN@0340  12/20/22 MK    Staphylococcus lugdunensis NOT DETECTED NOT DETECTED Final   Streptococcus species NOT DETECTED NOT DETECTED Final   Streptococcus agalactiae NOT DETECTED NOT DETECTED Final   Streptococcus pneumoniae NOT DETECTED NOT DETECTED Final   Streptococcus pyogenes NOT DETECTED NOT DETECTED Final   A.calcoaceticus-baumannii NOT DETECTED NOT DETECTED Final   Bacteroides fragilis NOT DETECTED NOT DETECTED Final   Enterobacterales NOT DETECTED NOT DETECTED Final   Enterobacter cloacae complex NOT DETECTED NOT DETECTED Final   Escherichia coli NOT DETECTED NOT DETECTED Final   Klebsiella aerogenes NOT DETECTED NOT DETECTED Final   Klebsiella oxytoca NOT DETECTED NOT DETECTED Final   Klebsiella pneumoniae NOT DETECTED NOT DETECTED Final   Proteus species NOT DETECTED NOT DETECTED Final   Salmonella species NOT DETECTED NOT DETECTED Final   Serratia marcescens NOT DETECTED NOT DETECTED Final   Haemophilus influenzae NOT DETECTED NOT DETECTED Final   Neisseria meningitidis NOT DETECTED NOT DETECTED Final   Pseudomonas aeruginosa NOT DETECTED NOT DETECTED Final   Stenotrophomonas  maltophilia NOT DETECTED NOT DETECTED Final   Candida albicans NOT DETECTED NOT DETECTED Final   Candida auris NOT DETECTED NOT DETECTED Final   Candida glabrata NOT DETECTED NOT DETECTED Final   Candida krusei NOT DETECTED NOT DETECTED Final   Candida parapsilosis NOT DETECTED NOT DETECTED Final   Candida tropicalis NOT DETECTED NOT DETECTED Final   Cryptococcus neoformans/gattii NOT DETECTED NOT DETECTED Final   Methicillin resistance mecA/C DETECTED (A) NOT DETECTED Final  Comment: CRITICAL RESULT CALLED TO, READ BACK BY AND VERIFIED WITH: S VILLALOBOS,RN@0340  12/20/22 MK Performed at [facility], [address] [place], [place] [address]   MRSA Next Gen by PCR, Nasal     Status: None   Collection Time: 12/19/22  5:07 AM   Specimen: Nasal Mucosa; Nasal Swab  Result Value Ref Range Status   MRSA by PCR Next Gen NOT DETECTED NOT DETECTED Final    Comment: (NOTE) The GeneXpert MRSA Assay (FDA approved for NASAL specimens only), is one component of a comprehensive MRSA colonization surveillance program. It is not intended to diagnose MRSA infection nor to guide or monitor treatment for MRSA infections. Test performance is not FDA approved in patients less than 50 years old. Performed at [facility], [address], [place], [place] [address]   Aerobic Culture w Gram Stain (superficial specimen)     Status: None   Collection Time: 12/19/22  2:40 PM   Specimen: Abscess  Result Value Ref Range Status   Specimen Description   Final    ABSCESS Performed at [facility], [address], [place], [place] [address]    Special Requests   Final    BUTTOCKS Performed at [facility], [address] [place], [place] [address]    Gram Stain   Final    FEW WBC PRESENT,BOTH PMN AND MONONUCLEAR RARE GRAM POSITIVE COCCI IN PAIRS RARE GRAM POSITIVE COCCI IN CHAINS    Culture   Final    RARE NORMAL SKIN FLORA Performed at [facility], [address], [place], [place]  [address]    Report Status 12/22/2022 FINAL  Final  MRSA Next Gen by PCR, Nasal     Status: None   Collection Time: 12/22/22 11:30 AM   Specimen: Nasal Mucosa; Nasal Swab  Result Value Ref Range Status   MRSA by PCR Next Gen NOT DETECTED NOT DETECTED Final    Comment: (NOTE) The GeneXpert MRSA Assay (FDA approved for NASAL specimens only), is one component of a comprehensive MRSA colonization surveillance program. It is not intended to diagnose MRSA infection nor to guide or monitor treatment for MRSA infections. Test performance is not FDA approved in patients less than 67 years old. Performed at [facility], [address], [place], [place] [address]     Labs: CBC: Recent Labs  Lab 01/14/23 1422 01/15/23 0508 01/16/23 0631 01/17/23 0402  WBC 12.1* 12.4* 16.5* 14.3*  HGB 7.9* 7.3* 6.9* 7.3*  HCT 29.9* 28.2* 24.8* 26.0*  MCV 79.9* 79.7* 77.7* 76.9*  PLT 329 308 285 274   Basic Metabolic Panel: Recent Labs  Lab 01/14/23 1422 01/15/23 0508 01/16/23 0631 01/17/23 0402  NA 142 140 134* 133*  K 4.3 4.4 4.6 4.2  CL 112* 110 105 104  CO2 22 22 22 22   GLUCOSE 158* 98 106* 103*  BUN 31* 30* 36* 40*  CREATININE 1.92* 1.71* 2.14* 2.00*  CALCIUM  8.4* 8.5* 7.9* 7.9*  MG 2.2  --  2.0  --    CBG: Recent Labs  Lab 01/16/23 0728 01/16/23 1122 01/16/23 1618 01/16/23 2214 01/17/23 0750  GLUCAP 108* 155* 177* 194* 139*    Discharge time spent: greater than 30 minutes.  Signed: Eric Nunnery, MD [facility] 01/17/2023

## 2023-01-17 NOTE — TOC Transition Note (Signed)
 Transition of Care [facility]) - Discharge Note   Patient Details  Name: [name] MRN: [id number] Date of Birth: April 26, 1955  Transition of Care [facility]) CM/SW Contact:  Rollo Petri, LCSW Phone Number: 01/17/2023, 11:34 AM   Clinical Narrative:     [name]. Updated Debbie at [facility] who states that they can admit pt after 12 noon today.  DC clinical sent electronically. RN to call report. [facility] transport arranged.  Daughter updated via HIPAA compliant Voicemail message.  No other TOC needs for dc.  Final next level of care: Skilled Nursing Facility Barriers to Discharge: Barriers Resolved   Patient Goals and CMS Choice Patient states their goals for this hospitalization and ongoing recovery are:: SNF CMS Medicare.gov Compare Post Acute Care list provided to:: Patient Choice offered to / list presented to : Patient [place] ownership interest in [facility].provided to:: Patient    Discharge Placement              Patient chooses bed at: Other - please specify in the comment section below: [facility]) Patient to be transferred to facility by: [facility] Name of family member notified: [name] Patient and family notified of of transfer: 01/17/23  Discharge Plan and Services Additional resources added to the After Visit Summary for   In-house Referral: Clinical Social Work   Post Acute Care Choice: Skilled Nursing Facility                               Social Drivers of Health (SDOH) Interventions SDOH Screenings   Food Insecurity: No Food Insecurity (01/14/2023)  Housing: Low Risk  (01/14/2023)  Transportation Needs: No Transportation Needs (01/14/2023)  Utilities: Not At Risk (01/14/2023)  Alcohol  Screen: Low Risk  (12/14/2022)  Depression (PHQ2-9): Low Risk  (12/14/2022)  Financial Resource Strain: Low Risk  (12/14/2022)  Physical Activity: Inactive (12/14/2022)  Social Connections: Moderately Integrated (01/14/2023)   Stress: Stress Concern Present (12/14/2022)  Tobacco Use: High Risk (01/14/2023)  Health Literacy: Adequate Health Literacy (12/14/2022)     Readmission Risk Interventions    12/19/2022   10:56 AM 11/04/2022   10:22 AM 10/21/2022    1:05 PM  Readmission Risk Prevention Plan  Transportation Screening Complete Complete Complete  HRI or Home Care Consult  Complete Complete  Social Work Consult for Recovery Care Planning/Counseling  Complete Complete  Palliative Care Screening  Not Applicable Not Applicable  Medication Review Oceanographer) Complete Complete Complete  HRI or Home Care Consult Complete    SW Recovery Care/Counseling Consult Complete    Palliative Care Screening Not Applicable    Skilled Nursing Facility Not Applicable

## 2023-01-17 NOTE — Patient Outreach (Signed)
  Care Coordination   Follow Up Visit Note   01/17/2023 Name: [name] MRN: [id number] DOB: April 07, 1955  [name] is a 68 y.o. year old male who sees [name], Tesfaye Demissie, MD for primary care. I  received a voicemail from daughter, Johanan Skorupski, regarding patient's planned transfer from [facility] to [facility] for rehab today. She also has questions about his Medicaid application that I discussed with Joanna Saporito, LCSW.    What matters to the patients health and wellness today?  I did not speak with patient today    Goals Addressed             This Visit's Progress    Care Management Needs   On track    Care Management Goals: Patient will transfer from [facility] to [facility] for short term rehab today Patient/daughter will talk with LCSW RE: Research Scientist (medical) will reach out to RN Care Manager at [phone] as needed        SDOH assessments and interventions completed:  No     Care Coordination Interventions:  Yes, provided  Interventions Today    Flowsheet Row Most Recent Value  Chronic Disease   Chronic disease during today's visit Chronic Kidney Disease/End Stage Renal Disease (ESRD), Congestive Heart Failure (CHF)  [Meningioma w/ Encephalopathy & Cerebral Edema]  General Interventions   General Interventions Discussed/Reviewed General Interventions Reviewed, Communication with, Doctor Visits, Level of Care  Doctor Visits Discussed/Reviewed Doctor Visits Discussed, Doctor Visits Reviewed  [reviewed inpatient notes and plan to transfer patient to [facility] for short term rehab]  Durable Medical Equipment (DME) Other, [name], Comptroller has been requested from PCP. Shower chair requested as well but more details were needed. Request on hold now since patient is being transferred to rehab.]  Wheelchair Standard  PCP/Specialist Visits Compliance with follow-up visit  [follow-up with PCP  after discharge from SNF for Transitional Care Management]  Communication with Social Work, RN  [RE: daughter's questions about Medicaid application. LCSW will reach out to daughter. Pablo Hurst, RN Case Management RE: monitoring for discharge from [facility].]  Level of Care Skilled Nursing Facility  [being discharged from [facility] today to [facility]]       Follow up plan:  Monitor for discharge from SNF and will follow-up by telephone after discharge.     Encounter Outcome:  Patient Visit Completed   [name], RN, BSN Care Manager [facility]  [facility]  Population Health  Direct Dial: [phone] Main #: [phone]

## 2023-01-17 NOTE — Patient Outreach (Signed)
 Care Coordination   Follow Up Visit Note   01/17/2023  Name: [name] MRN: [id number] DOB: August 31, 1955  [name] is a 68 y.o. year old male who sees [name], Russell Demissie, MD for primary care. I spoke with patient's daughter, [name] by phone today.  What matters to the patients health and wellness today?  Find Help in My Community.   Goals Addressed               This Visit's Progress     Find Help in My Community. (pt-stated)   On track     Care Coordination Interventions:   Interventions Today    Flowsheet Row Most Recent Value  Chronic Disease   Chronic disease during today's visit Congestive Heart Failure (CHF), Hypertension (HTN), Diabetes, Chronic Kidney Disease/End Stage Renal Disease (ESRD), Other  [Meningioma w/ Encephalopathy & Cerebral Edema, Cocaine Abuse, Tobacco Abuse, Inability to Perform Activities of Daily Living Independently, Caregiver Burnout/Stress/Fatigue, Chronic Pain, Pleural Effusion, Re-Hospitalized, Daughter Requesting Placement.]  General Interventions   General Interventions Discussed/Reviewed General Interventions Discussed, Labs, Vaccines, Doctor Visits, Health Screening, Annual Foot Exam, Lipid Profile, General Interventions Reviewed, Annual Eye Exam, Durable Medical Equipment (DME), Community Resources, Level of Care, Communication with  [Encouraged Routine Engagement with Care Team Members & Providers.]  Labs Hgb A1c every 3 months, Kidney Function  [Encouraged Routine Labwork.]  Vaccines COVID-19, Flu, Pneumonia, RSV, Shingles, Tetanus/Pertussis/Diphtheria  [Encouraged Routine Vaccinations.]  Doctor Visits Discussed/Reviewed Doctor Visits Discussed, Specialist, Doctor Visits Reviewed, Annual Wellness Visits, PCP  [Encouraged Routine Engagement with Care Team Members & Providers.]  Health Screening Bone Density, Colonoscopy, Prostate  [Encouraged Routine Health Screenings.]  Durable Medical Equipment (DME) [name], Wheelchair,  Research Scientist (medical) & Daughter Scientist, Forensic with Back, Glucometer or Continuous Glucose Monitor, Blood Pressure Monitor & Scales. CSW Will Request Order from Primary Care Provider.]  Wheelchair Standard  PCP/Specialist Visits Compliance with follow-up visit  [Encouraged Routine Engagement with Care Team Members & Providers.]  Communication with PCP/Specialists, RN, Pharmacists, Social Work  [employer] Routine Engagement with Care Team Members & Providers.]  Level of Care Adult Daycare, Air Traffic Controller, Assisted Living, Skilled Nursing Facility  [Two Weeks Ago, Patient Did Not Qualify for Gaffer at Skilled Nursing Facility Upon Discharge from Hospital, Returning Home with Home Health Services through [facility].]  Applications Medicaid, [place]-2, Other  [Now That Patient Has Been Re-Hospitalized, Daughter is Wanting to [employer] Level of Care Options (I.e Skilled Secondary School Teacher for Gaffer or Long-Term Memory Care Assisted Living Facility Placement).]  Exercise Interventions   Exercise Discussed/Reviewed Exercise Discussed, Assistive device use and maintanence, Exercise Reviewed, Physical Activity, Weight Managment  [Patient Encouraged to Work with Physical Therapist & Acupuncturist, While Hospitalized.]  Physical Activity Discussed/Reviewed Home Exercise Program (HEP), Physical Activity Discussed, PREP, Physical Activity Reviewed, Gym, Types of exercise  [Encouraged Increased Level of Activity & Exercise, as Tolerated.]  Weight Management Weight loss  [Encouraged Healthy [employer] Loss Program.]  Education Interventions   Education Provided Provided Therapist, Sports, Provided Web-based Education, Provided Education  [employer] Daughter: Merchandiser, Retail Long-Term Care Medicaid Application, Passenger Transport Manager, Aide & Attendance Benefits Information & Application, Aging, Disability &  Transit Engineer, Technical Sales.]  Provided Verbal Education On Nutrition, Mental Health/Coping with Illness, When to see the doctor, Foot Care, Eye Care, Labs, Applications, Blood Sugar Monitoring, Exercise, Medication, [employer], Psychologist, Sport And Exercise Reviewed to Ensure Understanding & Entertain Questions.]  Labs  Reviewed Hgb A1c  [Reviewed.]  Applications Medicaid, [place]-2, Other  [Now That Patient Has Been Re-Hospitalized, Daughter is Wanting to [employer] Level of Care Options (I.e Skilled Nursing Facility Placement for Short-Term Rehabilitative Services or Long-Term Memory Care Assisted Living Facility Placement).]  Mental Health Interventions   Mental Health Discussed/Reviewed Mental Health Discussed, Anxiety, Mental Health Reviewed, Depression, Grief and Loss, Coping Strategies, Substance Abuse, Suicide, Crisis, Other  [Assessed Mental Health & Cognitive Status.]  Nutrition Interventions   Nutrition Discussed/Reviewed Nutrition Discussed, Adding fruits and vegetables, Increasing proteins, Nutrition Reviewed, Decreasing fats, Fluid intake, Carbohydrate meal planning, Portion sizes, Decreasing salt, Decreasing sugar intake  [Encouraged Heart-Healthy, Diabetic-Friendly, Low Sodium, Renal-Friendly, Reduced Fat Diet.]  Pharmacy Interventions   Pharmacy Dicussed/Reviewed Pharmacy Topics Discussed, Medications and their functions, Medication Adherence, Pharmacy Topics Reviewed, Affording Medications  [Confirmed Ability to Afford Prescription Medications.]  Medication Adherence --  [Confirmed Prescription Medication Compliance.]  Safety Interventions   Safety Discussed/Reviewed Safety Discussed, Safety Reviewed, Fall Risk, Home Safety  [Encouraged Routine Use of Assistive Devices & Durable Medical Equipment.]  Home Safety Assistive Devices, Need for home safety assessment, Refer for community resources, Refer for home visit, Contact provider for referral to  PT/OT, Contact home health agency  [Encouraged Consideration of Gaffer in Skilled Nursing Facility, Upon Discharge from Hospital.]  Advanced Directive Interventions   Advanced Directives Discussed/Reviewed Advanced Directives Discussed, Advanced Directives Reviewed, Advanced Care Planning, Guardianship  [Encouraged Initiation of Advanced Directives (Living Will & Healthcare Power of Corporate Treasurer), Offering to [employer], Assist with Completion, Make Copies & Scan into Electronic Medical Record in Epic.]  Guardianship Provide resources, Refer to an agency  [Provided Daughter with Information on Filing for Guardianship at [employer] Process for Becoming Patient's Legal Guardian.]      Active Listening & Reflection Utilized. Verbalization of Feelings Encouraged. Emotional Support Provided. CSW Collaboration with Daughter, Reggie Bise to Confirm Patient's [facility] Discharge for Today & Transfer to [facility] [phone]), to Receive Short-Term Rehabilitative Services.  CSW Collaboration with Daughter, Jaimere Feutz to Confirm Receipt & Thoroughly Review The Following List of Resources, to Ensure Understanding & Entertain Questions:          ~ Assisted Living Facilities in [place], [place], [place], [employer] &                         [facility].          ~ Extended [employer] in [place], [place], [place], [employer] &                 [employer]. ~ Memory Care Assisted Living Facilities in [place], [place], [place],     [employer] & [employer]. ~ Skilled Nursing Facilities in [place], [place], [place], [facility] &     [place].  CSW Collaboration with Daughter, Eleno Weimar to Confirm Receipt & Thoroughly Review The Following List of Resources, to Ensure Understanding & Entertain Questions:           ~ How to Apply for Aid & Attendance Benefits - [employer]. ~ Aid & Attendance Benefits Application - Merchandiser, Retail.          ~ Associate Professor to [employer] - Merchandiser, Retail.          ~ Advice Worker - Merchandiser, Retail.          ~ Public Librarian in Armed Forces Technical Officer - Merchandiser, Retail. ~ Personal Assistant. ~  Step-By-Step Instructions on How to Apply for Medicaid On-Line. ~ 2024 Medicaid Income Guidelines & Reserve Licensed Conveyancer. ~ Making Medicaid Accessible Through [employer] [facility]. ~ Aging, Disability & Transit Services of [place].          ~ [facility]. ~ Magazine Features Editor Resources in [place].          ~ Designer, Industrial/product (Living Will Medical Laboratory Scientific Officer of [place]).              ~ Instructions on How to File for Legal Guardianship. ~ Guardianship Application. ~ [employer] Brochure. CSW Collaboration with Daughter, Matheu Ploeger to Assist with Completion of Medicaid Application & Submission to [facility] of Social Services [phone]) for Processing. CSW Collaboration with Daughter, Han Vejar to Encourage Routine Engagement with Jontay Maston, Licensed Clinical Social Worker with [facility] [phone]), if She Has Questions, Needs Assistance, or If Additional Social Work Needs Are Identified Between Now & Our Next Follow-Up Outreach Call, Scheduled on 01/27/2023 at 1:45 PM.      SDOH assessments and interventions completed:  Yes.  Care Coordination Interventions:  Yes, provided.   Follow up plan: Follow up call scheduled for 01/27/2023 at 1:45 pm.  Encounter Outcome:  Patient Visit Completed.   Philippe Desanctis, BSW, MSW, Printmaker Social Work Case Set Designer Health  [facility], Population Health Direct Dial: [phone]  Fax: [phone] Email: [email or web address] Website:  [email or web address]

## 2023-01-17 NOTE — Plan of Care (Signed)
  Problem: Clinical Measurements: Goal: Ability to maintain clinical measurements within normal limits will improve Outcome: Progressing   Problem: Activity: Goal: Risk for activity intolerance will decrease Outcome: Progressing   Problem: Nutrition: Goal: Adequate nutrition will be maintained Outcome: Progressing   Problem: Elimination: Goal: Will not experience complications related to bowel motility Outcome: Progressing Goal: Will not experience complications related to urinary retention Outcome: Progressing   Problem: Pain Management: Goal: General experience of comfort will improve Outcome: Progressing   Problem: Safety: Goal: Ability to remain free from injury will improve Outcome: Progressing   Problem: Skin Integrity: Goal: Risk for impaired skin integrity will decrease Outcome: Progressing   Problem: Activity: Goal: Capacity to carry out activities will improve Outcome: Progressing   Problem: Nutritional: Goal: Maintenance of adequate nutrition will improve Outcome: Progressing

## 2023-01-17 NOTE — Progress Notes (Signed)
 Pt sitting upright on the side of the bed. Reds clip attempted 3x. Low quality each time.

## 2023-01-17 NOTE — Progress Notes (Signed)
 Gave report to Nurse Delsa Bern at [facility].

## 2023-01-17 NOTE — Patient Instructions (Signed)
 Visit Information  Thank you for taking time to visit with me today. Please don't hesitate to contact me if I can be of assistance to you.   Following are the goals we discussed today:   Goals Addressed               This Visit's Progress     Find Help in My Community. (pt-stated)   On track     Care Coordination Interventions:   Interventions Today    Flowsheet Row Most Recent Value  Chronic Disease   Chronic disease during today's visit Congestive Heart Failure (CHF), Hypertension (HTN), Diabetes, Chronic Kidney Disease/End Stage Renal Disease (ESRD), Other  [Meningioma w/ Encephalopathy & Cerebral Edema, Cocaine Abuse, Tobacco Abuse, Inability to Perform Activities of Daily Living Independently, Caregiver Burnout/Stress/Fatigue, Chronic Pain, Pleural Effusion, Re-Hospitalized, Daughter Requesting Placement.]  General Interventions   General Interventions Discussed/Reviewed General Interventions Discussed, Labs, Vaccines, Doctor Visits, Health Screening, Annual Foot Exam, Lipid Profile, General Interventions Reviewed, Annual Eye Exam, Durable Medical Equipment (DME), Community Resources, Level of Care, Communication with  [Encouraged Routine Engagement with Care Team Members & Providers.]  Labs Hgb A1c every 3 months, Kidney Function  [Encouraged Routine Labwork.]  Vaccines COVID-19, Flu, Pneumonia, RSV, Shingles, Tetanus/Pertussis/Diphtheria  [Encouraged Routine Vaccinations.]  Doctor Visits Discussed/Reviewed Doctor Visits Discussed, Specialist, Doctor Visits Reviewed, Annual Wellness Visits, PCP  [Encouraged Routine Engagement with Care Team Members & Providers.]  Health Screening Bone Density, Colonoscopy, Prostate  [Encouraged Routine Health Screenings.]  Durable Medical Equipment (DME) [name], Wheelchair, Research Scientist (medical) & Daughter Scientist, Forensic with Back, Glucometer or Continuous Glucose Monitor, Blood Pressure Monitor & Scales. CSW Will Request  Order from Primary Care Provider.]  Wheelchair Standard  PCP/Specialist Visits Compliance with follow-up visit  [Encouraged Routine Engagement with Care Team Members & Providers.]  Communication with PCP/Specialists, RN, Pharmacists, Social Work  [employer] Routine Engagement with Care Team Members & Providers.]  Level of Care Adult Daycare, Air Traffic Controller, Assisted Living, Skilled Nursing Facility  [Two Weeks Ago, Patient Did Not Qualify for Gaffer at Skilled Nursing Facility Upon Discharge from Hospital, Returning Home with Home Health Services through [facility].]  Applications Medicaid, [place], Other  [Now That Patient Has Been Re-Hospitalized, Daughter is Wanting to [employer] Level of Care Options (I.e Skilled Secondary School Teacher for Gaffer or Long-Term Memory Care Assisted Living Facility Placement).]  Exercise Interventions   Exercise Discussed/Reviewed Exercise Discussed, Assistive device use and maintanence, Exercise Reviewed, Physical Activity, Weight Managment  [Patient Encouraged to Work with Physical Therapist & Acupuncturist, While Hospitalized.]  Physical Activity Discussed/Reviewed Home Exercise Program (HEP), Physical Activity Discussed, PREP, Physical Activity Reviewed, Gym, Types of exercise  [Encouraged Increased Level of Activity & Exercise, as Tolerated.]  Weight Management Weight loss  [Encouraged Healthy [employer] Loss Program.]  Education Interventions   Education Provided Provided Therapist, Sports, Provided Web-based Education, Provided Education  [employer] Daughter: Merchandiser, Retail Long-Term Care Medicaid Application, Passenger Transport Manager, Aide & Attendance Benefits Information & Application, Aging, Disability & Transit Engineer, Technical Sales.]  Provided Verbal Education On Nutrition, Mental Health/Coping with Illness, When to see the doctor, Foot Care, Eye Care, Labs,  Applications, Blood Sugar Monitoring, Exercise, Medication, [employer], Psychologist, Sport And Exercise Reviewed to Ensure Understanding & Entertain Questions.]  Labs Reviewed Hgb A1c  [Reviewed.]  Applications Medicaid, [place], Other  [Now That Patient Has Been Re-Hospitalized, Daughter is Wanting to [employer] Level of Care Options (I.e Skilled Nursing  Facility Placement for [employer] or Long-Term Memory Care Assisted Living Facility Placement).]  Mental Health Interventions   Mental Health Discussed/Reviewed Mental Health Discussed, Anxiety, Mental Health Reviewed, Depression, Grief and Loss, Coping Strategies, Substance Abuse, Suicide, Crisis, Other  [Assessed Mental Health & Cognitive Status.]  Nutrition Interventions   Nutrition Discussed/Reviewed Nutrition Discussed, Adding fruits and vegetables, Increasing proteins, Nutrition Reviewed, Decreasing fats, Fluid intake, Carbohydrate meal planning, Portion sizes, Decreasing salt, Decreasing sugar intake  [Encouraged Heart-Healthy, Diabetic-Friendly, Low Sodium, Renal-Friendly, Reduced Fat Diet.]  Pharmacy Interventions   Pharmacy Dicussed/Reviewed Pharmacy Topics Discussed, Medications and their functions, Medication Adherence, Pharmacy Topics Reviewed, Affording Medications  [Confirmed Ability to Afford Prescription Medications.]  Medication Adherence --  [Confirmed Prescription Medication Compliance.]  Safety Interventions   Safety Discussed/Reviewed Safety Discussed, Safety Reviewed, Fall Risk, Home Safety  [Encouraged Routine Use of Assistive Devices & Durable Medical Equipment.]  Home Safety Assistive Devices, Need for home safety assessment, Refer for community resources, Refer for home visit, Contact provider for referral to PT/OT, Contact home health agency  [Encouraged Consideration of Gaffer in Skilled Nursing Facility, Upon Discharge from Hospital.]  Advanced Directive  Interventions   Advanced Directives Discussed/Reviewed Advanced Directives Discussed, Advanced Directives Reviewed, Advanced Care Planning, Guardianship  [Encouraged Initiation of Advanced Directives (Living Will & Healthcare Power of Corporate Treasurer), Offering to [employer], Assist with Completion, Make Copies & Scan into Electronic Medical Record in Epic.]  Guardianship Provide resources, Refer to an agency  [Provided Daughter with Information on Filing for Guardianship at [employer] Process for Becoming Patient's Legal Guardian.]      Active Listening & Reflection Utilized. Verbalization of Feelings Encouraged. Emotional Support Provided. CSW Collaboration with Daughter, Davell Beckstead to Confirm Patient's [facility] Discharge for Today & Transfer to [facility] [phone]), to Receive Short-Term Rehabilitative Services.  CSW Collaboration with Daughter, Harland Aguiniga to Confirm Receipt & Thoroughly Review The Following List of Resources, to Ensure Understanding & Entertain Questions:  ~ Assisted Living Facilities in [place], [place], [place], [employer] & [facility].  ~ Extended [employer] in [place], [place], [place], [employer] & [employer]. ~ Memory Care Assisted Living Facilities in [place], [place], [place], [employer] & [employer]. ~ Skilled Nursing Facilities in [place], [place], [place], [facility] & [place].  CSW Collaboration with Daughter, Filimon Miranda to Confirm Receipt & Thoroughly Review The Following List of Resources, to Ensure Understanding & Entertain Questions:   ~ How to Apply for Aid & Attendance Benefits - Cigna. ~ Aid & Attendance Benefits Application - Merchandiser, Retail.  ~ Associate Professor to [employer] Personal Information - Merchandiser, Retail.  ~ Advice Worker - Merchandiser, Retail.  ~ Public Librarian in Building Surveyor of Claim - Scientist, Product/process Development. ~ Personal Assistant. ~ Step-By-Step Instructions on How to Apply for Health Net. ~ 2024 Medicaid Income Guidelines & Reserve Licensed Conveyancer. ~ Making Medicaid Accessible Through [employer]. ~ Aging, Disability & Transit Services of [place].  ~ [employer]. ~ Magazine Features Editor Resources in [place].  ~ Designer, Industrial/product (Living Will Microbiologist).  ~ Instructions on How to File for Legal Guardianship. ~ Guardianship Application. ~ [employer] Brochure. CSW Collaboration with Daughter, Keyonta Barradas to Assist with Completion of Medicaid Application & Submission to [facility] of Social Services [phone]) for Processing. CSW Collaboration with Daughter, Cooper Moroney to Encourage Routine  Engagement with Philippe Desanctis, Licensed Clinical Social Worker with [facility] [phone]), if She Has Questions, Needs Assistance, or If Additional Social Work Needs Are Identified Between Now & Our Next Follow-Up Outreach Call, Scheduled on 01/27/2023 at 1:45 PM.      Our next appointment is by telephone on 01/27/2023 at 1:45 pm.  Please call the care guide team at [phone] if you need to cancel or reschedule your appointment.   If you are experiencing a Mental Health or Behavioral Health Crisis or need someone to talk to, please call the Suicide and Crisis Lifeline: 988 call the [place]  National Suicide Prevention Lifeline: (435)078-2455 or TTY: 5105423697 TTY (773) 187-4850) to talk to a trained counselor call 1-800-273-TALK (toll free, 24 hour hotline) go to [facility] [address], [place] [phone]) call the [facility] Crisis Line: [phone] call 911  Patient verbalizes understanding of instructions and care plan provided today and agrees to view  in MyChart. Active MyChart status and patient understanding of how to access instructions and care plan via MyChart confirmed with patient.     Telephone follow up appointment with care management team member scheduled for:  01/27/2023 at 1:45 pm.  Philippe Desanctis, BSW, MSW, LCSW  Embedded Practice Social Work Case Manager  [facility], Population Health Direct Dial: [phone]  Fax: [phone] Email: [email or web address] Website: [email or web address]

## 2023-01-19 ENCOUNTER — Ambulatory Visit: Payer: 59 | Admitting: Urology

## 2023-01-19 DIAGNOSIS — N5082 Scrotal pain: Secondary | ICD-10-CM

## 2023-01-21 ENCOUNTER — Emergency Department (HOSPITAL_COMMUNITY)
Admission: EM | Admit: 2023-01-21 | Discharge: 2023-01-21 | Disposition: A | Discharge status: Home / Self Care | Payer: Medicare Other | Attending: Emergency Medicine | Admitting: Emergency Medicine

## 2023-01-21 ENCOUNTER — Encounter (HOSPITAL_COMMUNITY): Payer: Self-pay | Admitting: Emergency Medicine

## 2023-01-21 ENCOUNTER — Other Ambulatory Visit: Payer: Self-pay

## 2023-01-21 ENCOUNTER — Emergency Department (HOSPITAL_COMMUNITY): Payer: Medicare Other

## 2023-01-21 DIAGNOSIS — Z7982 Long term (current) use of aspirin: Secondary | ICD-10-CM | POA: Diagnosis not present

## 2023-01-21 DIAGNOSIS — I2510 Atherosclerotic heart disease of native coronary artery without angina pectoris: Secondary | ICD-10-CM | POA: Insufficient documentation

## 2023-01-21 DIAGNOSIS — I110 Hypertensive heart disease with heart failure: Secondary | ICD-10-CM | POA: Insufficient documentation

## 2023-01-21 DIAGNOSIS — R109 Unspecified abdominal pain: Secondary | ICD-10-CM | POA: Diagnosis present

## 2023-01-21 DIAGNOSIS — E119 Type 2 diabetes mellitus without complications: Secondary | ICD-10-CM | POA: Diagnosis not present

## 2023-01-21 DIAGNOSIS — Z794 Long term (current) use of insulin: Secondary | ICD-10-CM | POA: Insufficient documentation

## 2023-01-21 DIAGNOSIS — I5040 Unspecified combined systolic (congestive) and diastolic (congestive) heart failure: Secondary | ICD-10-CM | POA: Diagnosis not present

## 2023-01-21 DIAGNOSIS — D509 Iron deficiency anemia, unspecified: Secondary | ICD-10-CM

## 2023-01-21 DIAGNOSIS — Z79899 Other long term (current) drug therapy: Secondary | ICD-10-CM | POA: Insufficient documentation

## 2023-01-21 DIAGNOSIS — D72829 Elevated white blood cell count, unspecified: Secondary | ICD-10-CM | POA: Diagnosis not present

## 2023-01-21 DIAGNOSIS — N289 Disorder of kidney and ureter, unspecified: Secondary | ICD-10-CM | POA: Insufficient documentation

## 2023-01-21 DIAGNOSIS — R112 Nausea with vomiting, unspecified: Secondary | ICD-10-CM | POA: Diagnosis not present

## 2023-01-21 DIAGNOSIS — D649 Anemia, unspecified: Secondary | ICD-10-CM | POA: Diagnosis not present

## 2023-01-21 LAB — CBC WITH DIFFERENTIAL/PLATELET
Abs Immature Granulocytes: 0.11 10*3/uL — ABNORMAL HIGH (ref 0.00–0.07)
Basophils Absolute: 0.1 10*3/uL (ref 0.0–0.1)
Basophils Relative: 0 %
Eosinophils Absolute: 1.1 10*3/uL — ABNORMAL HIGH (ref 0.0–0.5)
Eosinophils Relative: 5 %
HCT: 30.2 % — ABNORMAL LOW (ref 39.0–52.0)
Hemoglobin: 8.4 g/dL — ABNORMAL LOW (ref 13.0–17.0)
Immature Granulocytes: 1 %
Lymphocytes Relative: 2 %
Lymphs Abs: 0.5 10*3/uL — ABNORMAL LOW (ref 0.7–4.0)
MCH: 21.9 pg — ABNORMAL LOW (ref 26.0–34.0)
MCHC: 27.8 g/dL — ABNORMAL LOW (ref 30.0–36.0)
MCV: 78.9 fL — ABNORMAL LOW (ref 80.0–100.0)
Monocytes Absolute: 0.7 10*3/uL (ref 0.1–1.0)
Monocytes Relative: 3 %
Neutro Abs: 17.8 10*3/uL — ABNORMAL HIGH (ref 1.7–7.7)
Neutrophils Relative %: 89 %
Platelets: 269 10*3/uL (ref 150–400)
RBC: 3.83 MIL/uL — ABNORMAL LOW (ref 4.22–5.81)
RDW: 23.8 % — ABNORMAL HIGH (ref 11.5–15.5)
WBC: 20.1 10*3/uL — ABNORMAL HIGH (ref 4.0–10.5)
nRBC: 0.4 % — ABNORMAL HIGH (ref 0.0–0.2)

## 2023-01-21 LAB — COMPREHENSIVE METABOLIC PANEL
ALT: 13 U/L (ref 0–44)
AST: 20 U/L (ref 15–41)
Albumin: 2.8 g/dL — ABNORMAL LOW (ref 3.5–5.0)
Alkaline Phosphatase: 98 U/L (ref 38–126)
Anion gap: 9 (ref 5–15)
BUN: 36 mg/dL — ABNORMAL HIGH (ref 8–23)
CO2: 26 mmol/L (ref 22–32)
Calcium: 8.4 mg/dL — ABNORMAL LOW (ref 8.9–10.3)
Chloride: 103 mmol/L (ref 98–111)
Creatinine, Ser: 2 mg/dL — ABNORMAL HIGH (ref 0.61–1.24)
GFR, Estimated: 36 mL/min — ABNORMAL LOW (ref >60)
Glucose, Bld: 176 mg/dL — ABNORMAL HIGH (ref 70–99)
Potassium: 4.2 mmol/L (ref 3.5–5.1)
Sodium: 138 mmol/L (ref 135–145)
Total Bilirubin: 0.7 mg/dL (ref 0.0–1.2)
Total Protein: 7.7 g/dL (ref 6.5–8.1)

## 2023-01-21 LAB — URINALYSIS, W/ REFLEX TO CULTURE (INFECTION SUSPECTED)
Bacteria, UA: NONE SEEN
Bilirubin Urine: NEGATIVE
Glucose, UA: >=500 mg/dL — AB
Hgb urine dipstick: NEGATIVE
Ketones, ur: NEGATIVE mg/dL
Leukocytes,Ua: NEGATIVE
Nitrite: NEGATIVE
Protein, ur: 100 mg/dL — AB
Specific Gravity, Urine: 1.014 (ref 1.005–1.030)
pH: 5 (ref 5.0–8.0)

## 2023-01-21 LAB — LIPASE, BLOOD: Lipase: 33 U/L (ref 11–51)

## 2023-01-21 MED ORDER — PROCHLORPERAZINE MALEATE 10 MG PO TABS: 10.0000 mg | ORAL_TABLET | Freq: Four times a day (QID) | ORAL | 0 refills | Status: DC | PRN

## 2023-01-21 MED ORDER — ONDANSETRON HCL 4 MG/2ML IJ SOLN
4.0000 mg | Freq: Once | INTRAMUSCULAR | Status: AC
  Administered 2023-01-21: 4 mg via INTRAVENOUS
  Filled 2023-01-21: qty 2

## 2023-01-21 MED ORDER — SODIUM CHLORIDE 0.9 % IV BOLUS
1000.0000 mL | Freq: Once | INTRAVENOUS | Status: AC
  Administered 2023-01-21: 1000 mL via INTRAVENOUS

## 2023-01-21 MED ORDER — PROCHLORPERAZINE EDISYLATE 10 MG/2ML IJ SOLN
10.0000 mg | Freq: Once | INTRAMUSCULAR | Status: AC
  Administered 2023-01-21: 10 mg via INTRAVENOUS
  Filled 2023-01-21: qty 2

## 2023-01-21 NOTE — ED Provider Notes (Signed)
 [place] EMERGENCY DEPARTMENT AT [facility] Provider Note   CSN: [id number] Arrival date & time: 01/21/23  9671     History  Chief Complaint  Patient presents with   Abdominal Pain   Foot Pain    [name] is a 68 y.o. male.  The history is provided by the patient.  Abdominal Pain Foot Pain Associated symptoms include abdominal pain.  He has history of hypertension, diabetes, hyperlipidemia, coronary artery disease, combined systolic and diastolic heart failure, stroke and comes in because of vomiting which started tonight.  He states he has vomited about 4 times.  He denies abdominal pain, fever, diarrhea.  He has had some cramping in his legs.  He denies any sick contacts.   Home Medications Prior to Admission medications   Medication Sig Start Date End Date Taking? Authorizing Provider  aspirin  EC (ASPIRIN  LOW DOSE) 81 MG tablet Take 1 tablet (81 mg total) by mouth daily. Swallow whole. 11/14/22   Evonnie Lenis, MD  atorvastatin  (LIPITOR ) 40 MG tablet Take 1 tablet (40 mg total) by mouth at bedtime. 10/25/22 01/14/23  Ricky Fines, MD  carvedilol  (COREG ) 3.125 MG tablet Take 1 tablet (3.125 mg total) by mouth 2 (two) times daily with a meal. 10/25/22 01/14/23  Ricky Fines, MD  ferrous sulfate  325 (65 FE) MG tablet Take 1 tablet (325 mg total) by mouth daily with breakfast. 11/14/22   Tat, Lenis, MD  folic acid  (FOLVITE ) 1 MG tablet Take 1 tablet (1 mg total) by mouth daily. 11/14/22   Evonnie Lenis, MD  gabapentin  (NEURONTIN ) 300 MG capsule Take 1 capsule (300 mg total) by mouth 3 (three) times daily. 10/25/22   Ricky Fines, MD  insulin  glargine (LANTUS ) 100 UNIT/ML Solostar Pen Inject 5 Units into the skin daily. Patient taking differently: Inject 5 Units into the skin at bedtime. 11/15/22   Evonnie Lenis, MD  JARDIANCE  10 MG TABS tablet Take 10 mg by mouth daily. 01/02/23   [provider]  linaclotide  (LINZESS ) 145 MCG CAPS capsule Take 1 capsule (145 mcg  total) by mouth daily before breakfast. 12/24/22   Tat, Lenis, MD  midodrine  (PROAMATINE ) 10 MG tablet Take 1 tablet (10 mg total) by mouth 3 (three) times daily with meals. 12/24/22   Evonnie Lenis, MD  pantoprazole  (PROTONIX ) 40 MG tablet Take 1 tablet (40 mg total) by mouth daily. 10/26/22   Ricky Fines, MD  polyethylene glycol (MIRALAX  / GLYCOLAX ) 17 g packet Take 17 g by mouth daily as needed for mild constipation. 01/17/23   Ricky Fines, MD  torsemide  (DEMADEX ) 20 MG tablet Take 2 tablets (40 mg total) by mouth daily. 01/17/23   Ricky Fines, MD      Allergies    Clopidogrel     Review of Systems   Review of Systems  Gastrointestinal:  Positive for abdominal pain.  All other systems reviewed and are negative.   Physical Exam Updated Vital Signs BP 110/77   Pulse (!) 101   Temp 98.1 F (36.7 C) (Oral)   Resp 18   Ht 5' 9 (1.753 m)   Wt 95.3 kg   SpO2 95%   BMI 31.03 kg/m  Physical Exam Vitals and nursing note reviewed.   68 year old male, resting comfortably and in no acute distress. Vital signs are significant for borderline elevated heart rate. Oxygen  saturation is 95%, which is normal. Head is normocephalic and atraumatic. PERRLA, EOMI. Oropharynx is clear. Neck is nontender and supple without adenopathy or  JVD. Back is nontender and there is no CVA tenderness. Lungs are clear without rales, wheezes, or rhonchi. Chest is nontender. Heart has regular rate and rhythm without murmur. Abdomen is soft, flat, nontender. Extremities have no cyanosis or edema, full range of motion is present. Skin is warm and dry without rash. Neurologic: Mental status is normal, cranial nerves are intact, there are no motor or sensory deficits.  ED Results / Procedures / Treatments   Labs (all labs ordered are listed, but only abnormal results are displayed) Labs Reviewed  COMPREHENSIVE METABOLIC PANEL - Abnormal; Notable for the following components:      Result Value   Glucose,  Bld 176 (*)    BUN 36 (*)    Creatinine, Ser 2.00 (*)    Calcium  8.4 (*)    Albumin  2.8 (*)    GFR, Estimated 36 (*)    All other components within normal limits  CBC WITH DIFFERENTIAL/PLATELET - Abnormal; Notable for the following components:   WBC 20.1 (*)    RBC 3.83 (*)    Hemoglobin 8.4 (*)    HCT 30.2 (*)    MCV 78.9 (*)    MCH 21.9 (*)    MCHC 27.8 (*)    RDW 23.8 (*)    nRBC 0.4 (*)    Neutro Abs 17.8 (*)    Lymphs Abs 0.5 (*)    Eosinophils Absolute 1.1 (*)    Abs Immature Granulocytes 0.11 (*)    All other components within normal limits  LIPASE, BLOOD  URINALYSIS, W/ REFLEX TO CULTURE (INFECTION SUSPECTED)    Radiology CT ABDOMEN PELVIS WO CONTRAST Result Date: 01/21/2023 CLINICAL DATA:  68 year old male with abdominal pain starting last night. Query bowel obstruction. EXAM: CT ABDOMEN AND PELVIS WITHOUT CONTRAST TECHNIQUE: Multidetector CT imaging of the abdomen and pelvis was performed following the standard protocol without IV contrast. RADIATION DOSE REDUCTION: This exam was performed according to the departmental dose-optimization program which includes automated exposure control, adjustment of the mA and/or kV according to patient size and/or use of iterative reconstruction technique. COMPARISON:  CT Chest, Abdomen, and Pelvis today 12/18/2022. FINDINGS: Lower chest: Stable cardiomegaly. No pericardial effusion. Right coronary artery atherosclerosis and/or stents redemonstrated. No pleural effusions. Mildly lower lung volumes from last month, mildly increased bilateral infrahilar pulmonary atelectasis and gas trapping. Small calcified right middle lobe granuloma redemonstrated. Hepatobiliary: Somewhat nodular liver contour. Chronic cholelithiasis. Stable liver and gallbladder. Pancreas: Negative noncontrast appearance. Spleen: Negative. Adrenals/Urinary Tract: Chronic renal vascular calcifications. Stable noncontrast adrenal glands, kidneys, bladder (less distended now).  No hydronephrosis or hydroureter. Stomach/Bowel: Intermittent respiratory motion limiting bowel detail initially. Repeat images at 0543 hours with better image quality. Average large bowel retained stool from the splenic flexure distally. Fluid in the proximal colon through the mid transverse. No dilated large bowel. No large bowel wall thickening is evident. Normal appendix is visible on coronal image 64. Fluid containing but nondilated small bowel loops throughout the abdomen and pelvis. No free air or free fluid identified. No ventral bowel hernia. Small volume of gas and fluid in the stomach. No discrete mesenteric stranding is evident. Vascular/Lymphatic: Aortoiliac calcified atherosclerosis. Stable mild tortuosity of the abdominal aorta, Common iliac arteries. Vascular patency is not evaluated in the absence of IV contrast. No lymphadenopathy. Reproductive: Negative. Other: No pelvis free fluid. Musculoskeletal: Chronic anterior left 8th rib fracture again noted. Maintained lower vertebral height. Advanced lumbosacral junction degeneration. No acute osseous abnormality identified. IMPRESSION: 1. No evidence of mechanical bowel obstruction.  But positive for fluid throughout nondilated small and the proximal large bowel suggesting Enteritis, Diarrhea. No free air or free fluid. Normal appendix. 2. No other acute or inflammatory process identified in the noncontrast abdomen or pelvis. 3. Chronic cardiomegaly, coronary artery atherosclerosis, cholelithiasis, Aortic Atherosclerosis (ICD10-I70.0). Electronically Signed   By: VEAR Hurst M.D.   On: 01/21/2023 06:05    Procedures Procedures    Medications Ordered in ED Medications - No data to display  ED Course/ Medical Decision Making/ A&P                                 Medical Decision Making Amount and/or Complexity of Data Reviewed Labs: ordered. Radiology: ordered.  Risk Prescription drug management.   Nausea and vomiting which appears to be  viral gastritis.  Consider bowel obstruction although no physical findings to suggest that.  I have ordered IV fluids, ondansetron  for nausea.  I have ordered laboratory workup of CBC, comprehensive metabolic panel, lipase, urinalysis.  Reviewed his laboratory test, and my interpretation is stable renal insufficiency, stable anemia, moderate leukocytosis which is nonspecific.  He only had slight relief of nausea with ondansetron .  Because of leukocytosis, I felt it was important to get CT of abdomen and pelvis.  I also gave a dose of prochlorperazine .  He endorses relief of nausea with prochlorperazine .  CT shows fluid throughout small and large bowel suggesting enteritis but no evidence of bowel obstruction no acute other acute or inflammatory process.  I have independently viewed the images, and agree with radiologist's interpretation.  At this point, I am waiting for a urinalysis to make sure he does not have concurrent UTI.    Urinalysis is significant only for proteinuria, no evidence of UTI.  I am discharging him with a prescription for prochlorperazine .  Final Clinical Impression(s) / ED Diagnoses Final diagnoses:  None    Rx / DC Orders ED Discharge Orders     None         Raford Lenis, MD 01/21/23 [phone]

## 2023-01-21 NOTE — Discharge Instructions (Addendum)
Return if symptoms are getting worse.

## 2023-01-21 NOTE — ED Triage Notes (Signed)
 Pt BIB [facility] from [facility] c/o lower abd pain with N/V that started earlier last night. Staff called EMS earlier tonight but pt refused to be transferred. Pt also c/o chronic foot pain from gout.

## 2023-01-21 NOTE — ED Notes (Signed)
 Daughter called for update and it was given by nurse.

## 2023-01-21 NOTE — ED Notes (Signed)
Report given to nurse at [facility].

## 2023-01-21 NOTE — ED Notes (Signed)
 Pt care taken, pt resting at this moment. Alert but confused.

## 2023-01-21 NOTE — ED Notes (Signed)
 Has been to ct and is back

## 2023-01-22 ENCOUNTER — Encounter (HOSPITAL_COMMUNITY): Payer: Self-pay

## 2023-01-22 ENCOUNTER — Emergency Department (HOSPITAL_COMMUNITY)
Admission: EM | Admit: 2023-01-22 | Discharge: 2023-01-23 | Disposition: A | Discharge status: Home / Self Care | Payer: No Typology Code available for payment source | Attending: Emergency Medicine | Admitting: Emergency Medicine

## 2023-01-22 ENCOUNTER — Other Ambulatory Visit: Payer: Self-pay

## 2023-01-22 ENCOUNTER — Emergency Department (HOSPITAL_COMMUNITY): Payer: No Typology Code available for payment source

## 2023-01-22 DIAGNOSIS — K529 Noninfective gastroenteritis and colitis, unspecified: Secondary | ICD-10-CM

## 2023-01-22 DIAGNOSIS — E1122 Type 2 diabetes mellitus with diabetic chronic kidney disease: Secondary | ICD-10-CM | POA: Insufficient documentation

## 2023-01-22 DIAGNOSIS — F1410 Cocaine abuse, uncomplicated: Secondary | ICD-10-CM | POA: Diagnosis not present

## 2023-01-22 DIAGNOSIS — I5040 Unspecified combined systolic (congestive) and diastolic (congestive) heart failure: Secondary | ICD-10-CM | POA: Diagnosis not present

## 2023-01-22 DIAGNOSIS — Z7984 Long term (current) use of oral hypoglycemic drugs: Secondary | ICD-10-CM | POA: Diagnosis not present

## 2023-01-22 DIAGNOSIS — R10819 Abdominal tenderness, unspecified site: Secondary | ICD-10-CM | POA: Diagnosis not present

## 2023-01-22 DIAGNOSIS — N189 Chronic kidney disease, unspecified: Secondary | ICD-10-CM | POA: Insufficient documentation

## 2023-01-22 DIAGNOSIS — Z7982 Long term (current) use of aspirin: Secondary | ICD-10-CM | POA: Insufficient documentation

## 2023-01-22 DIAGNOSIS — R109 Unspecified abdominal pain: Secondary | ICD-10-CM | POA: Diagnosis present

## 2023-01-22 LAB — COMPREHENSIVE METABOLIC PANEL
ALT: 14 U/L (ref 0–44)
AST: 27 U/L (ref 15–41)
Albumin: 2.4 g/dL — ABNORMAL LOW (ref 3.5–5.0)
Alkaline Phosphatase: 86 U/L (ref 38–126)
Anion gap: 7 (ref 5–15)
BUN: 55 mg/dL — ABNORMAL HIGH (ref 8–23)
CO2: 25 mmol/L (ref 22–32)
Calcium: 7.7 mg/dL — ABNORMAL LOW (ref 8.9–10.3)
Chloride: 100 mmol/L (ref 98–111)
Creatinine, Ser: 2.18 mg/dL — ABNORMAL HIGH (ref 0.61–1.24)
GFR, Estimated: 32 mL/min — ABNORMAL LOW (ref >60)
Glucose, Bld: 148 mg/dL — ABNORMAL HIGH (ref 70–99)
Potassium: 3.8 mmol/L (ref 3.5–5.1)
Sodium: 132 mmol/L — ABNORMAL LOW (ref 135–145)
Total Bilirubin: 0.9 mg/dL (ref 0.0–1.2)
Total Protein: 6.8 g/dL (ref 6.5–8.1)

## 2023-01-22 LAB — CBC WITH DIFFERENTIAL/PLATELET
Abs Immature Granulocytes: 0.03 10*3/uL (ref 0.00–0.07)
Basophils Absolute: 0 10*3/uL (ref 0.0–0.1)
Basophils Relative: 0 %
Eosinophils Absolute: 0.8 10*3/uL — ABNORMAL HIGH (ref 0.0–0.5)
Eosinophils Relative: 10 %
HCT: 25.8 % — ABNORMAL LOW (ref 39.0–52.0)
Hemoglobin: 7 g/dL — ABNORMAL LOW (ref 13.0–17.0)
Immature Granulocytes: 0 %
Lymphocytes Relative: 10 %
Lymphs Abs: 0.9 10*3/uL (ref 0.7–4.0)
MCH: 21.5 pg — ABNORMAL LOW (ref 26.0–34.0)
MCHC: 27.1 g/dL — ABNORMAL LOW (ref 30.0–36.0)
MCV: 79.1 fL — ABNORMAL LOW (ref 80.0–100.0)
Monocytes Absolute: 0.7 10*3/uL (ref 0.1–1.0)
Monocytes Relative: 9 %
Neutro Abs: 6 10*3/uL (ref 1.7–7.7)
Neutrophils Relative %: 71 %
Platelets: 182 10*3/uL (ref 150–400)
RBC: 3.26 MIL/uL — ABNORMAL LOW (ref 4.22–5.81)
RDW: 23.2 % — ABNORMAL HIGH (ref 11.5–15.5)
WBC: 8.4 10*3/uL (ref 4.0–10.5)
nRBC: 0.4 % — ABNORMAL HIGH (ref 0.0–0.2)

## 2023-01-22 LAB — CBG MONITORING, ED: Glucose-Capillary: 136 mg/dL — ABNORMAL HIGH (ref 70–99)

## 2023-01-22 LAB — LIPASE, BLOOD: Lipase: 33 U/L (ref 11–51)

## 2023-01-22 LAB — LACTIC ACID, PLASMA: Lactic Acid, Venous: 2 mmol/L (ref 0.5–1.9)

## 2023-01-22 MED ORDER — SODIUM CHLORIDE 0.9 % IV BOLUS
500.0000 mL | Freq: Once | INTRAVENOUS | Status: AC
  Administered 2023-01-22: 500 mL via INTRAVENOUS

## 2023-01-22 MED ORDER — ONDANSETRON HCL 4 MG/2ML IJ SOLN
4.0000 mg | Freq: Once | INTRAMUSCULAR | Status: AC
  Administered 2023-01-22: 4 mg via INTRAVENOUS
  Filled 2023-01-22: qty 2

## 2023-01-22 NOTE — ED Triage Notes (Signed)
 Pt brought in from [place] for altered mental status, nausea and vomiting, and low o2 saturation.

## 2023-01-22 NOTE — ED Provider Notes (Signed)
 [place] EMERGENCY DEPARTMENT AT [facility] Provider Note   CSN: [id number] Arrival date & time: 01/22/23  2139     History  Chief Complaint  Patient presents with   Altered Mental Status    [name] is a 68 y.o. male.   Altered Mental Status  This is a 68 year old male, according to the records that he presents with from his nursing facility he has a history of diabetes, high cholesterol, acid reflux, neuropathy as well as a history of systolic and diastolic congestive heart failure, chronic kidney disease, cocaine abuse, prior stroke and repeated falls, he comes from a nursing facility with a complaint of altered mental status.  It is not clear exact , what is meant by altered mental status, however he has been having vomiting, diarrhea and abdominal pain and was actually seen about 20 hours ago for the same complaint during which time he had a white blood cell count of 20,000 and a CT scan that showed enteritis and colitis with fluid-filled loops of small and large bowel.  It was reported that the patient had some hypoxia and the paramedics put him on oxygen  prehospital.    Home Medications Prior to Admission medications   Medication Sig Start Date End Date Taking? Authorizing Provider  aspirin  EC (ASPIRIN  LOW DOSE) 81 MG tablet Take 1 tablet (81 mg total) by mouth daily. Swallow whole. 11/14/22   Evonnie Lenis, MD  atorvastatin  (LIPITOR ) 40 MG tablet Take 1 tablet (40 mg total) by mouth at bedtime. 10/25/22 01/14/23  Ricky Fines, MD  carvedilol  (COREG ) 3.125 MG tablet Take 1 tablet (3.125 mg total) by mouth 2 (two) times daily with a meal. 10/25/22 01/14/23  Ricky Fines, MD  ferrous sulfate  325 (65 FE) MG tablet Take 1 tablet (325 mg total) by mouth daily with breakfast. 11/14/22   Tat, Lenis, MD  folic acid  (FOLVITE ) 1 MG tablet Take 1 tablet (1 mg total) by mouth daily. 11/14/22   Evonnie Lenis, MD  gabapentin  (NEURONTIN ) 300 MG capsule Take 1 capsule (300 mg total)  by mouth 3 (three) times daily. 10/25/22   Ricky Fines, MD  insulin  glargine (LANTUS ) 100 UNIT/ML Solostar Pen Inject 5 Units into the skin daily. Patient taking differently: Inject 5 Units into the skin at bedtime. 11/15/22   Evonnie Lenis, MD  JARDIANCE  10 MG TABS tablet Take 10 mg by mouth daily. 01/02/23   [provider]  linaclotide  (LINZESS ) 145 MCG CAPS capsule Take 1 capsule (145 mcg total) by mouth daily before breakfast. 12/24/22   Tat, Lenis, MD  midodrine  (PROAMATINE ) 10 MG tablet Take 1 tablet (10 mg total) by mouth 3 (three) times daily with meals. 12/24/22   Evonnie Lenis, MD  pantoprazole  (PROTONIX ) 40 MG tablet Take 1 tablet (40 mg total) by mouth daily. 10/26/22   Ricky Fines, MD  polyethylene glycol (MIRALAX  / GLYCOLAX ) 17 g packet Take 17 g by mouth daily as needed for mild constipation. 01/17/23   Ricky Fines, MD  prochlorperazine  (COMPAZINE ) 10 MG tablet Take 1 tablet (10 mg total) by mouth every 6 (six) hours as needed for nausea or vomiting. 01/21/23   Raford Lenis, MD  torsemide  (DEMADEX ) 20 MG tablet Take 2 tablets (40 mg total) by mouth daily. 01/17/23   Ricky Fines, MD      Allergies    Clopidogrel     Review of Systems   Review of Systems  All other systems reviewed and are negative.   Physical Exam Updated  Vital Signs BP 93/62 (BP Location: Right Arm)   Pulse 68   Temp (!) 97.5 F (36.4 C) (Oral)   Resp 11   Ht 1.753 m (5' 9)   Wt 95.3 kg   SpO2 99%   BMI 31.01 kg/m  Physical Exam Vitals and nursing note reviewed.  Constitutional:      General: He is not in acute distress.    Appearance: He is well-developed.  HENT:     Head: Normocephalic and atraumatic.     Mouth/Throat:     Pharynx: No oropharyngeal exudate.  Eyes:     General: No scleral icterus.       Right eye: No discharge.        Left eye: No discharge.     Conjunctiva/sclera: Conjunctivae normal.     Pupils: Pupils are equal, round, and reactive to light.  Neck:      Thyroid : No thyromegaly.     Vascular: No JVD.  Cardiovascular:     Rate and Rhythm: Normal rate and regular rhythm.     Heart sounds: Normal heart sounds. No murmur heard.    No friction rub. No gallop.  Pulmonary:     Effort: Pulmonary effort is normal. No respiratory distress.     Breath sounds: Normal breath sounds. No wheezing or rales.  Abdominal:     General: Bowel sounds are normal. There is no distension.     Palpations: Abdomen is soft. There is no mass.     Tenderness: There is abdominal tenderness.  Musculoskeletal:        General: No tenderness. Normal range of motion.     Cervical back: Normal range of motion and neck supple.     Right lower leg: No edema.     Left lower leg: No edema.  Lymphadenopathy:     Cervical: No cervical adenopathy.  Skin:    General: Skin is warm and dry.     Findings: No erythema or rash.  Neurological:     Mental Status: He is alert.     Coordination: Coordination normal.     Comments: The patient is somnolent but when I talk to him he immediately opens his eyes and answers my questions, he is able to follow commands with all 4 extremities, he lifts both legs off the bed symmetrically, gives me equal grips bilaterally, there is no facial droop, he is able to answer questions albeit in short sentences.  He does not appear to have any increased work of breathing  Psychiatric:        Behavior: Behavior normal.     ED Results / Procedures / Treatments   Labs (all labs ordered are listed, but only abnormal results are displayed) Labs Reviewed  CBG MONITORING, ED - Abnormal; Notable for the following components:      Result Value   Glucose-Capillary 136 (*)    All other components within normal limits  CBC WITH DIFFERENTIAL/PLATELET  COMPREHENSIVE METABOLIC PANEL  LIPASE, BLOOD  LACTIC ACID, PLASMA  LACTIC ACID, PLASMA  URINALYSIS, ROUTINE W REFLEX MICROSCOPIC    EKG None  Radiology CT ABDOMEN PELVIS WO CONTRAST Result Date:  01/21/2023 CLINICAL DATA:  68 year old male with abdominal pain starting last night. Query bowel obstruction. EXAM: CT ABDOMEN AND PELVIS WITHOUT CONTRAST TECHNIQUE: Multidetector CT imaging of the abdomen and pelvis was performed following the standard protocol without IV contrast. RADIATION DOSE REDUCTION: This exam was performed according to the departmental dose-optimization program which includes automated exposure control,  adjustment of the mA and/or kV according to patient size and/or use of iterative reconstruction technique. COMPARISON:  CT Chest, Abdomen, and Pelvis today 12/18/2022. FINDINGS: Lower chest: Stable cardiomegaly. No pericardial effusion. Right coronary artery atherosclerosis and/or stents redemonstrated. No pleural effusions. Mildly lower lung volumes from last month, mildly increased bilateral infrahilar pulmonary atelectasis and gas trapping. Small calcified right middle lobe granuloma redemonstrated. Hepatobiliary: Somewhat nodular liver contour. Chronic cholelithiasis. Stable liver and gallbladder. Pancreas: Negative noncontrast appearance. Spleen: Negative. Adrenals/Urinary Tract: Chronic renal vascular calcifications. Stable noncontrast adrenal glands, kidneys, bladder (less distended now). No hydronephrosis or hydroureter. Stomach/Bowel: Intermittent respiratory motion limiting bowel detail initially. Repeat images at 0543 hours with better image quality. Average large bowel retained stool from the splenic flexure distally. Fluid in the proximal colon through the mid transverse. No dilated large bowel. No large bowel wall thickening is evident. Normal appendix is visible on coronal image 64. Fluid containing but nondilated small bowel loops throughout the abdomen and pelvis. No free air or free fluid identified. No ventral bowel hernia. Small volume of gas and fluid in the stomach. No discrete mesenteric stranding is evident. Vascular/Lymphatic: Aortoiliac calcified atherosclerosis.  Stable mild tortuosity of the abdominal aorta, Common iliac arteries. Vascular patency is not evaluated in the absence of IV contrast. No lymphadenopathy. Reproductive: Negative. Other: No pelvis free fluid. Musculoskeletal: Chronic anterior left 8th rib fracture again noted. Maintained lower vertebral height. Advanced lumbosacral junction degeneration. No acute osseous abnormality identified. IMPRESSION: 1. No evidence of mechanical bowel obstruction. But positive for fluid throughout nondilated small and the proximal large bowel suggesting Enteritis, Diarrhea. No free air or free fluid. Normal appendix. 2. No other acute or inflammatory process identified in the noncontrast abdomen or pelvis. 3. Chronic cardiomegaly, coronary artery atherosclerosis, cholelithiasis, Aortic Atherosclerosis (ICD10-I70.0). Electronically Signed   By: VEAR Hurst M.D.   On: 01/21/2023 06:05    Procedures Procedures    Medications Ordered in ED Medications - No data to display  ED Course/ Medical Decision Making/ A&P Clinical Course as of 01/24/23 1950  [place] Jan 22, 2023  2249 Attempted calling nursing home without any answer, went to voicemail [BM]    Clinical Course User Index [BM] Cleotilde Rogue, MD                                 Medical Decision Making Amount and/or Complexity of Data Reviewed Labs: ordered. Radiology: ordered.  Risk Prescription drug management.    This patient presents to the ED for concern of possible altered mental status, this involves an extensive number of treatment options, and is a complaint that carries with it a high risk of complications and morbidity.  The differential diagnosis includes infection, dehydration, progressive gastrointestinal process especially given his leukocytosis yesterday   Co morbidities that complicate the patient evaluation  Prior stroke, cocaine abuse, ischemic cardiomyopathy, nursing home patient   Additional history obtained:  Additional  history obtained from medical record External records from outside source obtained and reviewed including prior CT scans and prior admissions to the hospital, most recently admitted for acute on chronic congestive heart failure approximately 8 days ago during which time the patient stayed from January 4 through January 7. The patient had been coming from home at that visit but was placed in a skilled nursing facility upon discharge    Lab Tests:  I Ordered, and personally interpreted labs.  The pertinent results include: Lactic acid  2.0, white blood cell count of 8400, hemoglobin of 7.0 which according to prior values over the last month is essentially unchanged.  Metabolic panel with mild renal insufficiency unchanged from prior values over the last month, electrolytes reassuring, lipase normal and blood sugars with only mild hyperglycemia   Imaging Studies ordered:  I ordered imaging studies including chest x-ray I independently visualized and interpreted imaging which showed no acute findings, low lung volumes with mild vascular congestion I agree with the radiologist interpretation   Cardiac Monitoring: / EKG:  The patient was maintained on a cardiac monitor.  I personally viewed and interpreted the cardiac monitored which showed an underlying rhythm of: Normal sinus rhythm   Consultations Obtained:  At the time of change of shift care signed out to Dr. Wanita to follow-up results of urinalysis and improvement and disposition accordingly.  Patient is receiving some IV fluids, of note his white blood cell count has significantly improved from his visit the prior day where it was well over 20,000 t admission, treatments were needed:1} Final Clinical Impression(s) / ED Diagnoses Final diagnoses:  None    Rx / DC Orders ED Discharge Orders     None         Cleotilde Rogue, MD 01/24/23 1952

## 2023-01-23 LAB — LACTIC ACID, PLASMA: Lactic Acid, Venous: 1.5 mmol/L (ref 0.5–1.9)

## 2023-01-23 MED ORDER — HYOSCYAMINE SULFATE 0.125 MG SL SUBL
0.2500 mg | SUBLINGUAL_TABLET | SUBLINGUAL | Status: AC
  Administered 2023-01-23: 0.25 mg via SUBLINGUAL
  Filled 2023-01-23: qty 2

## 2023-01-23 MED ORDER — LOPERAMIDE HCL 2 MG PO CAPS: 2.0000 mg | ORAL_CAPSULE | Freq: Four times a day (QID) | ORAL | 0 refills | Status: DC | PRN

## 2023-01-23 MED ORDER — ONDANSETRON 4 MG PO TBDP: ORAL_TABLET | ORAL | 0 refills | Status: DC

## 2023-01-23 NOTE — ED Provider Notes (Signed)
 Signed out by Dr. Cleotilde.  Patient seen with altered mental status, abdominal pain, continued vomiting and diarrhea.  Patient seen 1 day ago for same and had reassuring CAT scan.  He continues to complain of pain here in the ED.  Blood pressures slightly low.  He was recently hospitalized for congestive heart failure exacerbation, given a small fluid bolus with some improvement of his blood pressures.  Extensive chart review reveals his normal blood pressure ranges from 95-105 systolic.  He is in this range currently.  He continued to have some cramping but this seems to have significantly improved after Levsin .  As his entire workup was very reassuring and he just had a CAT scan that only showed gastroenteritis, has a benign abdominal exam, does not require hospitalization.   Haze Lonni PARAS, MD 01/23/23 [phone]

## 2023-01-23 NOTE — ED Notes (Signed)
 Attempted to call report to [facility] x1. Will attempt one more time.

## 2023-01-23 NOTE — ED Notes (Signed)
Report attempted x2 with no answer

## 2023-01-26 ENCOUNTER — Encounter (INDEPENDENT_AMBULATORY_CARE_PROVIDER_SITE_OTHER): Payer: Self-pay

## 2023-01-27 ENCOUNTER — Encounter: Payer: Self-pay | Admitting: *Deleted

## 2023-01-28 ENCOUNTER — Emergency Department (HOSPITAL_COMMUNITY): Payer: No Typology Code available for payment source

## 2023-01-28 ENCOUNTER — Inpatient Hospital Stay (HOSPITAL_COMMUNITY)
Admission: EM | Admit: 2023-01-28 | Discharge: 2023-02-14 | DRG: 064 | Disposition: A | Discharge status: Other Acute Inpatient Hospital | Payer: No Typology Code available for payment source | Source: Skilled Nursing Facility | Attending: Internal Medicine | Admitting: Internal Medicine

## 2023-01-28 ENCOUNTER — Other Ambulatory Visit: Payer: Self-pay

## 2023-01-28 ENCOUNTER — Encounter (HOSPITAL_COMMUNITY): Payer: Self-pay

## 2023-01-28 ENCOUNTER — Inpatient Hospital Stay (HOSPITAL_COMMUNITY): Payer: No Typology Code available for payment source

## 2023-01-28 DIAGNOSIS — J810 Acute pulmonary edema: Secondary | ICD-10-CM | POA: Diagnosis not present

## 2023-01-28 DIAGNOSIS — I6389 Other cerebral infarction: Secondary | ICD-10-CM | POA: Diagnosis not present

## 2023-01-28 DIAGNOSIS — Z515 Encounter for palliative care: Secondary | ICD-10-CM | POA: Diagnosis not present

## 2023-01-28 DIAGNOSIS — G9341 Metabolic encephalopathy: Secondary | ICD-10-CM | POA: Diagnosis present

## 2023-01-28 DIAGNOSIS — I2510 Atherosclerotic heart disease of native coronary artery without angina pectoris: Secondary | ICD-10-CM | POA: Diagnosis present

## 2023-01-28 DIAGNOSIS — F17200 Nicotine dependence, unspecified, uncomplicated: Secondary | ICD-10-CM | POA: Diagnosis present

## 2023-01-28 DIAGNOSIS — Z931 Gastrostomy status: Secondary | ICD-10-CM

## 2023-01-28 DIAGNOSIS — I639 Cerebral infarction, unspecified: Principal | ICD-10-CM | POA: Diagnosis present

## 2023-01-28 DIAGNOSIS — I10 Essential (primary) hypertension: Secondary | ICD-10-CM

## 2023-01-28 DIAGNOSIS — K219 Gastro-esophageal reflux disease without esophagitis: Secondary | ICD-10-CM | POA: Diagnosis present

## 2023-01-28 DIAGNOSIS — Z8701 Personal history of pneumonia (recurrent): Secondary | ICD-10-CM

## 2023-01-28 DIAGNOSIS — D631 Anemia in chronic kidney disease: Secondary | ICD-10-CM | POA: Diagnosis present

## 2023-01-28 DIAGNOSIS — I5042 Chronic combined systolic (congestive) and diastolic (congestive) heart failure: Secondary | ICD-10-CM | POA: Diagnosis present

## 2023-01-28 DIAGNOSIS — B49 Unspecified mycosis: Secondary | ICD-10-CM | POA: Diagnosis not present

## 2023-01-28 DIAGNOSIS — R6521 Severe sepsis with septic shock: Secondary | ICD-10-CM | POA: Diagnosis not present

## 2023-01-28 DIAGNOSIS — Z794 Long term (current) use of insulin: Secondary | ICD-10-CM

## 2023-01-28 DIAGNOSIS — N179 Acute kidney failure, unspecified: Secondary | ICD-10-CM | POA: Diagnosis present

## 2023-01-28 DIAGNOSIS — G9349 Other encephalopathy: Secondary | ICD-10-CM | POA: Diagnosis present

## 2023-01-28 DIAGNOSIS — I6381 Other cerebral infarction due to occlusion or stenosis of small artery: Secondary | ICD-10-CM | POA: Diagnosis present

## 2023-01-28 DIAGNOSIS — Z6830 Body mass index (BMI) 30.0-30.9, adult: Secondary | ICD-10-CM

## 2023-01-28 DIAGNOSIS — D320 Benign neoplasm of cerebral meninges: Secondary | ICD-10-CM | POA: Diagnosis present

## 2023-01-28 DIAGNOSIS — I69391 Dysphagia following cerebral infarction: Secondary | ICD-10-CM

## 2023-01-28 DIAGNOSIS — I130 Hypertensive heart and chronic kidney disease with heart failure and stage 1 through stage 4 chronic kidney disease, or unspecified chronic kidney disease: Secondary | ICD-10-CM | POA: Diagnosis present

## 2023-01-28 DIAGNOSIS — R29708 NIHSS score 8: Secondary | ICD-10-CM | POA: Diagnosis present

## 2023-01-28 DIAGNOSIS — L8990 Pressure ulcer of unspecified site, unspecified stage: Secondary | ICD-10-CM | POA: Diagnosis present

## 2023-01-28 DIAGNOSIS — E860 Dehydration: Secondary | ICD-10-CM | POA: Diagnosis present

## 2023-01-28 DIAGNOSIS — R079 Chest pain, unspecified: Secondary | ICD-10-CM | POA: Diagnosis not present

## 2023-01-28 DIAGNOSIS — G4733 Obstructive sleep apnea (adult) (pediatric): Secondary | ICD-10-CM | POA: Diagnosis present

## 2023-01-28 DIAGNOSIS — N1832 Chronic kidney disease, stage 3b: Secondary | ICD-10-CM | POA: Diagnosis present

## 2023-01-28 DIAGNOSIS — M109 Gout, unspecified: Secondary | ICD-10-CM | POA: Diagnosis present

## 2023-01-28 DIAGNOSIS — R1310 Dysphagia, unspecified: Secondary | ICD-10-CM | POA: Diagnosis present

## 2023-01-28 DIAGNOSIS — G936 Cerebral edema: Secondary | ICD-10-CM | POA: Diagnosis present

## 2023-01-28 DIAGNOSIS — N390 Urinary tract infection, site not specified: Secondary | ICD-10-CM | POA: Diagnosis not present

## 2023-01-28 DIAGNOSIS — J690 Pneumonitis due to inhalation of food and vomit: Secondary | ICD-10-CM | POA: Diagnosis present

## 2023-01-28 DIAGNOSIS — I509 Heart failure, unspecified: Secondary | ICD-10-CM

## 2023-01-28 DIAGNOSIS — E782 Mixed hyperlipidemia: Secondary | ICD-10-CM

## 2023-01-28 DIAGNOSIS — R7881 Bacteremia: Secondary | ICD-10-CM | POA: Diagnosis not present

## 2023-01-28 DIAGNOSIS — E1165 Type 2 diabetes mellitus with hyperglycemia: Secondary | ICD-10-CM | POA: Diagnosis not present

## 2023-01-28 DIAGNOSIS — K921 Melena: Secondary | ICD-10-CM | POA: Diagnosis not present

## 2023-01-28 DIAGNOSIS — J9602 Acute respiratory failure with hypercapnia: Secondary | ICD-10-CM | POA: Diagnosis present

## 2023-01-28 DIAGNOSIS — F1410 Cocaine abuse, uncomplicated: Secondary | ICD-10-CM | POA: Diagnosis present

## 2023-01-28 DIAGNOSIS — J9601 Acute respiratory failure with hypoxia: Secondary | ICD-10-CM | POA: Diagnosis present

## 2023-01-28 DIAGNOSIS — Z955 Presence of coronary angioplasty implant and graft: Secondary | ICD-10-CM

## 2023-01-28 DIAGNOSIS — K589 Irritable bowel syndrome without diarrhea: Secondary | ICD-10-CM | POA: Diagnosis present

## 2023-01-28 DIAGNOSIS — I739 Peripheral vascular disease, unspecified: Secondary | ICD-10-CM | POA: Diagnosis present

## 2023-01-28 DIAGNOSIS — I252 Old myocardial infarction: Secondary | ICD-10-CM | POA: Diagnosis not present

## 2023-01-28 DIAGNOSIS — R569 Unspecified convulsions: Secondary | ICD-10-CM | POA: Diagnosis not present

## 2023-01-28 DIAGNOSIS — E11649 Type 2 diabetes mellitus with hypoglycemia without coma: Secondary | ICD-10-CM | POA: Diagnosis present

## 2023-01-28 DIAGNOSIS — J15212 Pneumonia due to Methicillin resistant Staphylococcus aureus: Secondary | ICD-10-CM | POA: Diagnosis not present

## 2023-01-28 DIAGNOSIS — G8194 Hemiplegia, unspecified affecting left nondominant side: Secondary | ICD-10-CM | POA: Diagnosis present

## 2023-01-28 DIAGNOSIS — M1000 Idiopathic gout, unspecified site: Secondary | ICD-10-CM | POA: Diagnosis not present

## 2023-01-28 DIAGNOSIS — Z888 Allergy status to other drugs, medicaments and biological substances status: Secondary | ICD-10-CM

## 2023-01-28 DIAGNOSIS — Z7982 Long term (current) use of aspirin: Secondary | ICD-10-CM

## 2023-01-28 DIAGNOSIS — E1142 Type 2 diabetes mellitus with diabetic polyneuropathy: Secondary | ICD-10-CM | POA: Diagnosis present

## 2023-01-28 DIAGNOSIS — E669 Obesity, unspecified: Secondary | ICD-10-CM | POA: Diagnosis present

## 2023-01-28 DIAGNOSIS — A0811 Acute gastroenteropathy due to Norwalk agent: Secondary | ICD-10-CM | POA: Diagnosis not present

## 2023-01-28 DIAGNOSIS — R29713 NIHSS score 13: Secondary | ICD-10-CM | POA: Diagnosis not present

## 2023-01-28 DIAGNOSIS — R4182 Altered mental status, unspecified: Secondary | ICD-10-CM | POA: Diagnosis not present

## 2023-01-28 DIAGNOSIS — F1490 Cocaine use, unspecified, uncomplicated: Secondary | ICD-10-CM | POA: Diagnosis not present

## 2023-01-28 DIAGNOSIS — I4891 Unspecified atrial fibrillation: Secondary | ICD-10-CM | POA: Diagnosis present

## 2023-01-28 DIAGNOSIS — E878 Other disorders of electrolyte and fluid balance, not elsewhere classified: Secondary | ICD-10-CM | POA: Diagnosis not present

## 2023-01-28 DIAGNOSIS — E1149 Type 2 diabetes mellitus with other diabetic neurological complication: Secondary | ICD-10-CM | POA: Diagnosis present

## 2023-01-28 DIAGNOSIS — B9620 Unspecified Escherichia coli [E. coli] as the cause of diseases classified elsewhere: Secondary | ICD-10-CM | POA: Diagnosis not present

## 2023-01-28 DIAGNOSIS — Z7189 Other specified counseling: Secondary | ICD-10-CM | POA: Diagnosis not present

## 2023-01-28 DIAGNOSIS — L89152 Pressure ulcer of sacral region, stage 2: Secondary | ICD-10-CM | POA: Diagnosis present

## 2023-01-28 DIAGNOSIS — I5020 Unspecified systolic (congestive) heart failure: Secondary | ICD-10-CM | POA: Diagnosis not present

## 2023-01-28 DIAGNOSIS — H2100 Hyphema, unspecified eye: Secondary | ICD-10-CM | POA: Diagnosis present

## 2023-01-28 DIAGNOSIS — Z66 Do not resuscitate: Secondary | ICD-10-CM | POA: Diagnosis not present

## 2023-01-28 DIAGNOSIS — R578 Other shock: Secondary | ICD-10-CM | POA: Diagnosis not present

## 2023-01-28 DIAGNOSIS — I255 Ischemic cardiomyopathy: Secondary | ICD-10-CM | POA: Diagnosis present

## 2023-01-28 DIAGNOSIS — Z833 Family history of diabetes mellitus: Secondary | ICD-10-CM

## 2023-01-28 DIAGNOSIS — E1122 Type 2 diabetes mellitus with diabetic chronic kidney disease: Secondary | ICD-10-CM | POA: Diagnosis present

## 2023-01-28 DIAGNOSIS — I110 Hypertensive heart disease with heart failure: Secondary | ICD-10-CM

## 2023-01-28 DIAGNOSIS — E440 Moderate protein-calorie malnutrition: Secondary | ICD-10-CM | POA: Diagnosis present

## 2023-01-28 DIAGNOSIS — A419 Sepsis, unspecified organism: Secondary | ICD-10-CM | POA: Diagnosis not present

## 2023-01-28 DIAGNOSIS — I5022 Chronic systolic (congestive) heart failure: Secondary | ICD-10-CM | POA: Diagnosis not present

## 2023-01-28 DIAGNOSIS — R29729 NIHSS score 29: Secondary | ICD-10-CM | POA: Diagnosis not present

## 2023-01-28 DIAGNOSIS — D649 Anemia, unspecified: Secondary | ICD-10-CM | POA: Diagnosis present

## 2023-01-28 DIAGNOSIS — Z1612 Extended spectrum beta lactamase (ESBL) resistance: Secondary | ICD-10-CM | POA: Diagnosis not present

## 2023-01-28 DIAGNOSIS — E870 Hyperosmolality and hypernatremia: Secondary | ICD-10-CM | POA: Diagnosis not present

## 2023-01-28 DIAGNOSIS — Z79899 Other long term (current) drug therapy: Secondary | ICD-10-CM

## 2023-01-28 DIAGNOSIS — R296 Repeated falls: Secondary | ICD-10-CM | POA: Diagnosis present

## 2023-01-28 DIAGNOSIS — E1151 Type 2 diabetes mellitus with diabetic peripheral angiopathy without gangrene: Secondary | ICD-10-CM | POA: Diagnosis present

## 2023-01-28 DIAGNOSIS — R7989 Other specified abnormal findings of blood chemistry: Secondary | ICD-10-CM | POA: Diagnosis not present

## 2023-01-28 DIAGNOSIS — Z8249 Family history of ischemic heart disease and other diseases of the circulatory system: Secondary | ICD-10-CM

## 2023-01-28 DIAGNOSIS — R471 Dysarthria and anarthria: Secondary | ICD-10-CM | POA: Diagnosis present

## 2023-01-28 DIAGNOSIS — Z6831 Body mass index (BMI) 31.0-31.9, adult: Secondary | ICD-10-CM

## 2023-01-28 DIAGNOSIS — I119 Hypertensive heart disease without heart failure: Secondary | ICD-10-CM | POA: Diagnosis present

## 2023-01-28 DIAGNOSIS — F17210 Nicotine dependence, cigarettes, uncomplicated: Secondary | ICD-10-CM | POA: Diagnosis present

## 2023-01-28 DIAGNOSIS — J189 Pneumonia, unspecified organism: Secondary | ICD-10-CM | POA: Diagnosis not present

## 2023-01-28 DIAGNOSIS — I5043 Acute on chronic combined systolic (congestive) and diastolic (congestive) heart failure: Secondary | ICD-10-CM | POA: Diagnosis not present

## 2023-01-28 DIAGNOSIS — R54 Age-related physical debility: Secondary | ICD-10-CM | POA: Diagnosis present

## 2023-01-28 DIAGNOSIS — I6329 Cerebral infarction due to unspecified occlusion or stenosis of other precerebral arteries: Secondary | ICD-10-CM | POA: Diagnosis not present

## 2023-01-28 DIAGNOSIS — I63232 Cerebral infarction due to unspecified occlusion or stenosis of left carotid arteries: Secondary | ICD-10-CM | POA: Diagnosis not present

## 2023-01-28 DIAGNOSIS — Z7984 Long term (current) use of oral hypoglycemic drugs: Secondary | ICD-10-CM

## 2023-01-28 LAB — GLUCOSE, CAPILLARY
Glucose-Capillary: 106 mg/dL — ABNORMAL HIGH (ref 70–99)
Glucose-Capillary: 108 mg/dL — ABNORMAL HIGH (ref 70–99)
Glucose-Capillary: 121 mg/dL — ABNORMAL HIGH (ref 70–99)
Glucose-Capillary: 49 mg/dL — ABNORMAL LOW (ref 70–99)
Glucose-Capillary: 81 mg/dL (ref 70–99)

## 2023-01-28 LAB — RAPID URINE DRUG SCREEN, HOSP PERFORMED
Amphetamines: NOT DETECTED
Barbiturates: NOT DETECTED
Benzodiazepines: NOT DETECTED
Cocaine: NOT DETECTED
Opiates: NOT DETECTED
Tetrahydrocannabinol: NOT DETECTED

## 2023-01-28 LAB — COMPREHENSIVE METABOLIC PANEL
ALT: 13 U/L (ref 0–44)
AST: 16 U/L (ref 15–41)
Albumin: 3 g/dL — ABNORMAL LOW (ref 3.5–5.0)
Alkaline Phosphatase: 93 U/L (ref 38–126)
Anion gap: 10 (ref 5–15)
BUN: 62 mg/dL — ABNORMAL HIGH (ref 8–23)
CO2: 24 mmol/L (ref 22–32)
Calcium: 8.4 mg/dL — ABNORMAL LOW (ref 8.9–10.3)
Chloride: 103 mmol/L (ref 98–111)
Creatinine, Ser: 3.03 mg/dL — ABNORMAL HIGH (ref 0.61–1.24)
GFR, Estimated: 22 mL/min — ABNORMAL LOW (ref >60)
Glucose, Bld: 111 mg/dL — ABNORMAL HIGH (ref 70–99)
Potassium: 4.2 mmol/L (ref 3.5–5.1)
Sodium: 137 mmol/L (ref 135–145)
Total Bilirubin: 1.2 mg/dL (ref 0.0–1.2)
Total Protein: 8.1 g/dL (ref 6.5–8.1)

## 2023-01-28 LAB — DIFFERENTIAL
Abs Immature Granulocytes: 0.09 10*3/uL — ABNORMAL HIGH (ref 0.00–0.07)
Basophils Absolute: 0.1 10*3/uL (ref 0.0–0.1)
Basophils Relative: 1 %
Eosinophils Absolute: 0.8 10*3/uL — ABNORMAL HIGH (ref 0.0–0.5)
Eosinophils Relative: 5 %
Immature Granulocytes: 1 %
Lymphocytes Relative: 6 %
Lymphs Abs: 0.9 10*3/uL (ref 0.7–4.0)
Monocytes Absolute: 0.6 10*3/uL (ref 0.1–1.0)
Monocytes Relative: 4 %
Neutro Abs: 13.2 10*3/uL — ABNORMAL HIGH (ref 1.7–7.7)
Neutrophils Relative %: 83 %

## 2023-01-28 LAB — URINALYSIS, ROUTINE W REFLEX MICROSCOPIC
Bilirubin Urine: NEGATIVE
Glucose, UA: 150 mg/dL — AB
Hgb urine dipstick: NEGATIVE
Ketones, ur: NEGATIVE mg/dL
Leukocytes,Ua: NEGATIVE
Nitrite: NEGATIVE
Protein, ur: 30 mg/dL — AB
Specific Gravity, Urine: 1.014 (ref 1.005–1.030)
pH: 5 (ref 5.0–8.0)

## 2023-01-28 LAB — HEMOGLOBIN A1C
Hgb A1c MFr Bld: 7.2 % — ABNORMAL HIGH (ref 4.8–5.6)
Mean Plasma Glucose: 159.94 mg/dL

## 2023-01-28 LAB — BLOOD GAS, ARTERIAL
Acid-base deficit: 3.6 mmol/L — ABNORMAL HIGH (ref 0.0–2.0)
Bicarbonate: 24.2 mmol/L (ref 20.0–28.0)
Drawn by: 27016
FIO2: 36 %
O2 Saturation: 97.5 %
Patient temperature: 37
pCO2 arterial: 54 mm[Hg] — ABNORMAL HIGH (ref 32–48)
pH, Arterial: 7.26 — ABNORMAL LOW (ref 7.35–7.45)
pO2, Arterial: 86 mm[Hg] (ref 83–108)

## 2023-01-28 LAB — BLOOD GAS, VENOUS
Acid-base deficit: 3.1 mmol/L — ABNORMAL HIGH (ref 0.0–2.0)
Bicarbonate: 25.8 mmol/L (ref 20.0–28.0)
Drawn by: 7213
O2 Saturation: 28.1 %
Patient temperature: 36.7
pCO2, Ven: 62 mm[Hg] — ABNORMAL HIGH (ref 44–60)
pH, Ven: 7.22 — ABNORMAL LOW (ref 7.25–7.43)
pO2, Ven: <31 mm[Hg] — CL (ref 32–45)

## 2023-01-28 LAB — APTT: aPTT: 39 s — ABNORMAL HIGH (ref 24–36)

## 2023-01-28 LAB — PROCALCITONIN: Procalcitonin: 0.52 ng/mL

## 2023-01-28 LAB — CBC
HCT: 31.3 % — ABNORMAL LOW (ref 39.0–52.0)
Hemoglobin: 8.2 g/dL — ABNORMAL LOW (ref 13.0–17.0)
MCH: 21.1 pg — ABNORMAL LOW (ref 26.0–34.0)
MCHC: 26.2 g/dL — ABNORMAL LOW (ref 30.0–36.0)
MCV: 80.7 fL (ref 80.0–100.0)
Platelets: 321 10*3/uL (ref 150–400)
RBC: 3.88 MIL/uL — ABNORMAL LOW (ref 4.22–5.81)
RDW: 23.2 % — ABNORMAL HIGH (ref 11.5–15.5)
WBC: 15.7 10*3/uL — ABNORMAL HIGH (ref 4.0–10.5)
nRBC: 1.3 % — ABNORMAL HIGH (ref 0.0–0.2)

## 2023-01-28 LAB — CBG MONITORING, ED: Glucose-Capillary: 129 mg/dL — ABNORMAL HIGH (ref 70–99)

## 2023-01-28 LAB — ETHANOL: Alcohol, Ethyl (B): <10 mg/dL (ref <10)

## 2023-01-28 LAB — MRSA NEXT GEN BY PCR, NASAL: MRSA by PCR Next Gen: DETECTED — AB

## 2023-01-28 LAB — PROTIME-INR
INR: 1.3 — ABNORMAL HIGH (ref 0.8–1.2)
Prothrombin Time: 16.6 s — ABNORMAL HIGH (ref 11.4–15.2)

## 2023-01-28 MED ORDER — SENNOSIDES-DOCUSATE SODIUM 8.6-50 MG PO TABS: 1.0000 | ORAL_TABLET | Freq: Every evening | ORAL | Status: DC | PRN

## 2023-01-28 MED ORDER — INSULIN ASPART 100 UNIT/ML IJ SOLN
0.0000 [IU] | INTRAMUSCULAR | Status: DC
Start: 2023-01-28 — End: 2023-01-31
  Administered 2023-01-29 – 2023-01-30 (×2): 1 [IU] via SUBCUTANEOUS
  Administered 2023-01-30 – 2023-01-31 (×4): 2 [IU] via SUBCUTANEOUS

## 2023-01-28 MED ORDER — IOHEXOL 350 MG/ML SOLN
75.0000 mL | Freq: Once | INTRAVENOUS | Status: AC | PRN
  Administered 2023-01-28: 75 mL via INTRAVENOUS

## 2023-01-28 MED ORDER — ASPIRIN 300 MG RE SUPP
300.0000 mg | Freq: Every day | RECTAL | Status: DC
  Administered 2023-01-29: 300 mg via RECTAL
  Filled 2023-01-28 (×2): qty 1

## 2023-01-28 MED ORDER — DEXTROSE 50 % IV SOLN: 1.0000 | Freq: Once | INTRAVENOUS | Status: DC

## 2023-01-28 MED ORDER — ASPIRIN 325 MG PO TABS: 325.0000 mg | ORAL_TABLET | Freq: Every day | ORAL | Status: DC

## 2023-01-28 MED ORDER — ACETAMINOPHEN 325 MG PO TABS
650.0000 mg | ORAL_TABLET | ORAL | Status: DC | PRN
  Filled 2023-01-28: qty 2

## 2023-01-28 MED ORDER — CHLORHEXIDINE GLUCONATE CLOTH 2 % EX PADS
6.0000 | MEDICATED_PAD | Freq: Every day | CUTANEOUS | Status: AC
  Administered 2023-01-29 – 2023-02-02 (×5): 6 via TOPICAL

## 2023-01-28 MED ORDER — SODIUM CHLORIDE 0.9 % IV SOLN: INTRAVENOUS | Status: DC

## 2023-01-28 MED ORDER — MUPIROCIN 2 % EX OINT
1.0000 | TOPICAL_OINTMENT | Freq: Two times a day (BID) | CUTANEOUS | Status: AC
  Administered 2023-01-28 – 2023-02-01 (×9): 1 via NASAL
  Filled 2023-01-28 (×3): qty 22

## 2023-01-28 MED ORDER — IPRATROPIUM-ALBUTEROL 0.5-2.5 (3) MG/3ML IN SOLN: 3.0000 mL | RESPIRATORY_TRACT | Status: DC

## 2023-01-28 MED ORDER — STROKE: EARLY STAGES OF RECOVERY BOOK
Freq: Once | Status: AC
  Filled 2023-01-28: qty 1

## 2023-01-28 MED ORDER — CHLORHEXIDINE GLUCONATE CLOTH 2 % EX PADS
6.0000 | MEDICATED_PAD | Freq: Every day | CUTANEOUS | Status: DC
  Administered 2023-01-28 – 2023-02-09 (×9): 6 via TOPICAL

## 2023-01-28 MED ORDER — ASPIRIN 81 MG PO CHEW: 324.0000 mg | CHEWABLE_TABLET | Freq: Once | ORAL | Status: DC

## 2023-01-28 MED ORDER — DEXTROSE-SODIUM CHLORIDE 5-0.9 % IV SOLN: INTRAVENOUS | Status: DC

## 2023-01-28 MED ORDER — ACETAMINOPHEN 160 MG/5ML PO SOLN
650.0000 mg | ORAL | Status: DC | PRN
  Filled 2023-01-28: qty 20.3

## 2023-01-28 MED ORDER — ACETAMINOPHEN 650 MG RE SUPP
650.0000 mg | RECTAL | Status: DC | PRN
  Filled 2023-01-28: qty 1

## 2023-01-28 MED ORDER — IPRATROPIUM-ALBUTEROL 0.5-2.5 (3) MG/3ML IN SOLN
3.0000 mL | RESPIRATORY_TRACT | Status: DC
  Administered 2023-01-28 – 2023-01-29 (×8): 3 mL via RESPIRATORY_TRACT
  Filled 2023-01-28 (×7): qty 3

## 2023-01-28 MED ORDER — HEPARIN SODIUM (PORCINE) 5000 UNIT/ML IJ SOLN
5000.0000 [IU] | Freq: Three times a day (TID) | INTRAMUSCULAR | Status: DC
  Administered 2023-01-28 – 2023-02-14 (×52): 5000 [IU] via SUBCUTANEOUS
  Filled 2023-01-28 (×51): qty 1

## 2023-01-28 MED ORDER — SODIUM CHLORIDE 0.9 % IV SOLN
3.0000 g | Freq: Two times a day (BID) | INTRAVENOUS | Status: DC
  Administered 2023-01-28 – 2023-01-31 (×7): 3 g via INTRAVENOUS
  Filled 2023-01-28 (×10): qty 8

## 2023-01-28 MED ORDER — INSULIN ASPART 100 UNIT/ML IJ SOLN: 0.0000 [IU] | Freq: Three times a day (TID) | INTRAMUSCULAR | Status: DC

## 2023-01-28 MED ORDER — DEXTROSE 50 % IV SOLN
INTRAVENOUS | Status: AC
  Administered 2023-01-28: 50 mL
  Filled 2023-01-28: qty 50

## 2023-01-28 NOTE — Consult Note (Signed)
[facility]rology Consult Services   Patient Name:   [name], [name] Date of Birth:   Apr 06, 1955 Identification Number:   MRN - [id number] Date of Service:   01/28/2023 02:06:07  Diagnosis:       I63.89 - Cerebrovascular accident (CVA) due to other mechanism [facility])  Impression:      [name] is a 68 year-old man with a PMH of HTN, HLD, DM, A.fib, Strokes, CKD, Meningioma, Cocaine Use who was BIBEMS from a facility for altered mental status. TNK was not given as he is outside the window.  Our recommendations are outlined below.  Recommendations:        Stroke/Telemetry Floor       Neuro Checks       Bedside Swallow Eval       DVT Prophylaxis       IV Fluids, Normal Saline       Head of Bed 30 Degrees       Euglycemia and Avoid Hyperthermia (PRN Acetaminophen)       Initiate or continue Aspirin 325 MG daily       Antihypertensives PRN if Blood pressure is greater than 220/120 or there is a concern for End organ damage/contraindications for permissive HTN. If blood pressure is greater than 220/120 give labetalol PO or IV or Vasotec IV with a goal of 15% reduction in BP during the first 24 hours.       Toxic/Metabolic Workup as per primary team       MRI Brain without contrast       TTE       Labs: lipid panel; HgbA1c       PT/OT/ST where applicable  Sign Out:       Discussed with Emergency Department Provider    ------------------------------------------------------------------------------  Advanced Imaging: CTA Head and Neck Completed.  CTP Completed.  LVO:No  Patient in not a candidate for NIR   Metrics: Last Known Well: Unknown Dispatch Time: 01/28/2023 02:06:07 Arrival Time: 01/28/2023 01:33:00 Initial Response Time: 01/28/2023 02:13:52 Symptoms: Altered mental status. Initial patient interaction: 01/28/2023 02:30:12 NIHSS Assessment Completed: 01/28/2023 02:32:52 Patient is not a candidate for Thrombolytic. Thrombolytic  Medical Decision: 01/28/2023 02:32:54 Patient was not deemed candidate for Thrombolytic because of following reasons: LKW outside 4.5 hr window. .  CT head showed no acute hemorrhage or acute core infarct.  Primary Provider Notified of Diagnostic Impression and Management Plan on: 01/28/2023 02:54:21    ------------------------------------------------------------------------------  History of Present Illness: Patient is a 68 year old Male.  Patient was brought by EMS for symptoms of Altered mental status. [name] is a 68 year-old man with a PMH of HTN, HLD, DM, A.fib, Strokes, CKD, Meningioma, Cocaine Use, CHF who was BIBEMS from a facility for altered mental status. As per ER staff, the staff at his facility were unsure of his last known well time. They reported that when they arrived at change of shift [name] was confused and lethargic. Review of the records reveal that he was discharged from the hospital on 01/17/2023 after being admitted for acute on chronic CHF.   Past Medical History:      Hypertension      Diabetes Mellitus      Hyperlipidemia      Coronary Artery Disease      Stroke  Medications:  No Anticoagulant use  Antiplatelet use: Yes ASA 81mg  Reviewed EMR for current medications  Allergies:  Reviewed Description: Plavix  Social History: Drug Use: Yes  Family History:  There is no family history of premature cerebrovascular disease pertinent to this consultation  ROS : 14 Points Review of Systems was performed and was negative except mentioned in HPI.  Past Surgical History: There Is No Surgical History Contributory To Today's Visit    Examination: BP(94/66), Pulse(84), 1A: Level of Consciousness - Requires repeated stimulation to arouse + 2 1B: Ask Month and Age - 1 Question Right + 1 1C: Blink Eyes & Squeeze Hands - Performs 0 Tasks + 2 2: Test Horizontal Extraocular Movements - Partial Gaze Palsy: Can Be Overcome + 1 3: Test Visual  Fields - No Visual Loss + 0 4: Test Facial Palsy (Use Grimace if Obtunded) - Normal symmetry + 0 5A: Test Left Arm Motor Drift - No Drift for 10 Seconds + 0 5B: Test Right Arm Motor Drift - No Drift for 10 Seconds + 0 6A: Test Left Leg Motor Drift - No Effort Against Gravity + 3 6B: Test Right Leg Motor Drift - No Effort Against Gravity + 3 7: Test Limb Ataxia (FNF/Heel-Shin) - No Ataxia + 0 8: Test Sensation - Normal; No sensory loss + 0 9: Test Language/Aphasia - Normal; No aphasia + 0 10: Test Dysarthria - Normal + 0 11: Test Extinction/Inattention - No abnormality + 0  NIHSS Score: 12   Pre-Morbid Modified Rankin Scale: Unable to assess  Spoke with : ER Physician (Dr. Wilkie Aye)  This consult was conducted in real time using interactive audio and Immunologist. Patient was informed of the technology being used for this visit and agreed to proceed. Patient located in hospital and provider located at home/office setting.   Patient is being evaluated for possible acute neurologic impairment and high probability of imminent or life-threatening deterioration. I spent total of 35 minutes providing care to this patient, including time for face to face visit via telemedicine, review of medical records, imaging studies and discussion of findings with providers, the patient and/or family.   Dr Wilford Sports   [facility] For Inpatient follow-up with [facility] physician please call [facility] at [phone]. As we are not an outpatient service for any post hospital discharge needs please contact the hospital for assistance. If you have any questions for the [facility] physicians or need to reconsult for clinical or diagnostic changes please contact us via [facility] at [phone].

## 2023-01-28 NOTE — ED Notes (Signed)
ED TO INPATIENT HANDOFF REPORT  ED Nurse Name and Phone #:   S Name/Age/Gender [name] 68 y.o. male Room/Bed: APA12/APA12  Code Status   Code Status: Full Code  Home/SNF/Other Nursing Home  Is this baseline? No   Triage Complete: Triage complete  Chief Complaint Acute metabolic encephalopathy [G93.41]  Triage Note [facility] from [facility].  Cc of patient being confused and lethargic.  Per staff patient has been like that since 7pm. Normally patient is alert and oriented.  Wont answer any questions.  159cbg with ems. Not normally on oxygen but today he is.  Hx of cocaine abuse    Allergies Allergies  Allergen Reactions   Clopidogrel Other (See Comments)    Drowsy, Skin irritation    Level of Care/Admitting Diagnosis ED Disposition     ED Disposition  Admit   Condition  --   Comment  Hospital Area: [facility] [[id number]]  Level of Care: Telemetry [5]  Covid Evaluation: Asymptomatic - no recent exposure (last 10 days) testing not required  Diagnosis: Acute metabolic encephalopathy [[id number]]  Admitting Physician: Cleora Fleet [[id number]]  Attending Physician: Cleora Fleet [[id number]]  Certification:: I certify this patient will need inpatient services for at least 2 midnights  Expected Medical Readiness: 02/02/2023          B Medical/Surgery History Past Medical History:  Diagnosis Date   Bulging lumbar disc    CAD (coronary artery disease) [phone]   a. prior LAD stenting. b. s/p DES to [facility] 08/2015. c. 04/2016 Cardiac cath at [facility]. Patent stent in the PLAD and RCA. Diffuse dLAD, OM2, and  RPDA disease. d. DES to PDA and distal RCA 03/2019    Chronic lower back pain    CKD (chronic kidney disease), stage II    Cocaine abuse (HCC)    Cyst of epididymis    DM2 (diabetes mellitus, type 2) (HCC)    Essential hypertension    Fournier gangrene    GERD (gastroesophageal reflux disease)    Headache    History of pneumonia     Hyperlipidemia    Ischemic cardiomyopathy    LV (left ventricular) mural thrombus    Sleep apnea    Past Surgical History:  Procedure Laterality Date   APPENDECTOMY     BIOPSY  11/11/2022   Procedure: BIOPSY;  Surgeon: Franky Macho, MD;  [facility];  Service: Endoscopy;;   BRONCHIAL NEEDLE ASPIRATION BIOPSY  11/12/2021   Procedure: BRONCHIAL NEEDLE ASPIRATION BIOPSIES;  Surgeon: Omar Person, MD;  Location: [facility] ENDOSCOPY;  Service: Pulmonary;;   CARDIAC CATHETERIZATION N/A 09/07/2015   Procedure: Left Heart Cath and Coronary Angiography;  Surgeon: Marykay Lex, MD;  [facility] INVASIVE CV LAB;  Service: Cardiovascular;  Laterality: N/A;   CARDIAC CATHETERIZATION N/A 09/07/2015   Procedure: Coronary Stent Intervention;  Surgeon: Marykay Lex, MD;  Location: [facility] INVASIVE CV LAB;  Service: Cardiovascular;  Laterality: N/A;   COLONOSCOPY WITH PROPOFOL N/A 11/11/2022   Procedure: COLONOSCOPY WITH PROPOFOL;  Surgeon: Franky Macho, MD;  Location: [facility];  Service: Endoscopy;  Laterality: N/A;   CORONARY ANGIOGRAM  09/07/13   residual RCA and OM disease   CORONARY ANGIOPLASTY WITH STENT PLACEMENT     CORONARY STENT INTERVENTION N/A 10/16/2018   Procedure: CORONARY STENT INTERVENTION;  Surgeon: Kathleene Hazel, MD;  [facility]: [facility] INVASIVE CV LAB;  Service: Cardiovascular;  Laterality: N/A;   CORONARY STENT INTERVENTION N/A 03/11/2019  Procedure: CORONARY STENT INTERVENTION;  Surgeon: Corky Crafts, MD;  Location: [facility] INVASIVE CV LAB;  Service: Cardiovascular;  Laterality: N/A;   ESOPHAGOGASTRODUODENOSCOPY (EGD) WITH PROPOFOL N/A 11/11/2022   Procedure: ESOPHAGOGASTRODUODENOSCOPY (EGD) WITH PROPOFOL;  Surgeon: Franky Macho, MD;  [facility];  Service: Endoscopy;  Laterality: N/A;   FRACTURE SURGERY     HEMOSTASIS CLIP PLACEMENT  11/11/2022   Procedure: HEMOSTASIS CLIP PLACEMENT;  Surgeon: Franky Macho, MD;  Location[facility];  Service: Endoscopy;;   INCISION AND DRAINAGE OF WOUND Left 05/19/2019   Procedure: DEBRIDEMENT LEFT GROIN;  Surgeon: Violeta Gelinas, MD;  Location: [facility] OR;  Service: General;  Laterality: Left;   INSERTION OF ILIAC STENT Left 11/30/2017   Left external illiac stent   INSERTION OF ILIAC STENT  11/30/2017   Procedure: Insertion Of Iliac Stent;  Surgeon: Runell Gess, MD;  Location: [facility] INVASIVE CV LAB;  Service: Cardiovascular;;  Left external illiac stent   KNEE ARTHROSCOPY Left    KNEE SURGERY     "ligaments, cartilage; tendon, put a pin in" (11/30/2017)   LEFT HEART CATH Bilateral 07/08/2012   Procedure: LEFT HEART CATH;  Surgeon: Corky Crafts, MD;  Location: [facility] CATH LAB;  Service: Cardiovascular;  Laterality: Bilateral;   LEFT HEART CATH AND CORONARY ANGIOGRAPHY N/A 10/16/2018   Procedure: LEFT HEART CATH AND CORONARY ANGIOGRAPHY;  Surgeon: Kathleene Hazel, MD;  [facility]: [facility] INVASIVE CV LAB;  Service: Cardiovascular;  Laterality: N/A;   LEFT HEART CATH AND CORONARY ANGIOGRAPHY N/A 03/11/2019   Procedure: LEFT HEART CATH AND CORONARY ANGIOGRAPHY;  Surgeon: Corky Crafts, MD;  Location: [facility] INVASIVE CV LAB;  Service: Cardiovascular;  Laterality: N/A;   LEFT HEART CATHETERIZATION WITH CORONARY ANGIOGRAM N/A 09/06/2013   STEMI, 2nd ISR LAD. Procedure: LEFT HEART CATHETERIZATION WITH CORONARY ANGIOGRAM;  Surgeon: Corky Crafts, MD;  Location: [facility] CATH LAB;  Service: Cardiovascular;  Laterality: N/A;   LOWER EXTREMITY ANGIOGRAPHY N/A 11/30/2017   Procedure: LOWER EXTREMITY ANGIOGRAPHY;  Surgeon: Runell Gess, MD;  Location: [facility] INVASIVE CV LAB;  Service: Cardiovascular;  Laterality: N/A;   PERCUTANEOUS CORONARY STENT INTERVENTION (PCI-S)  07/08/2012   Procedure: PERCUTANEOUS CORONARY STENT INTERVENTION (PCI-S);  Surgeon: Corky Crafts, MD;  Location: [facility] CATH LAB;  Service: Cardiovascular;;  DES LAD   PERCUTANEOUS CORONARY STENT INTERVENTION (PCI-S) N/A  09/06/2013   Procedure: PERCUTANEOUS CORONARY STENT INTERVENTION (PCI-S);  Surgeon: Corky Crafts, MD;  Location: [facility] CATH LAB;  Service: Cardiovascular;  Laterality: N/A;  Mid LAD 3.0/24mm Promus   POLYPECTOMY  11/11/2022   Procedure: POLYPECTOMY INTESTINAL;  Surgeon: Franky Macho, MD;  [facility];  Service: Endoscopy;;   SCLEROTHERAPY  11/11/2022   Procedure: Susa Day;  Surgeon: Franky Macho, MD;  [facility];  Service: Endoscopy;;   SUBMUCOSAL TATTOO INJECTION  11/11/2022   Procedure: SUBMUCOSAL TATTOO INJECTION;  Surgeon: Franky Macho, MD;  [facility];  Service: Endoscopy;;   THORACENTESIS Right 11/10/2021   Procedure: Alanson Puls;  Surgeon: Leslye Peer, MD;  Location: [facility] ENDOSCOPY;  Service: Cardiopulmonary;  Laterality: Right;   VIDEO BRONCHOSCOPY WITH ENDOBRONCHIAL ULTRASOUND Right 11/12/2021   Procedure: VIDEO BRONCHOSCOPY WITH ENDOBRONCHIAL ULTRASOUND;  Surgeon: Omar Person, MD;  Location: [facility] ENDOSCOPY;  Service: Pulmonary;  Laterality: Right;   WOUND DEBRIDEMENT Left 05/20/2019   Procedure: DEBRIDEMENT GROIN;  Surgeon: Kinsinger, De Blanch, MD;  Location: [facility] OR;  Service: General;  Laterality: Left;   WRIST  FRACTURE SURGERY Bilateral      A IV Location/Drains/Wounds Patient Lines/Drains/Airways Status     Active Line/Drains/Airways     Name Placement date Placement time Site Days   Peripheral IV 01/28/23 20 G Anterior;Distal;Left;Upper Arm 01/28/23  0223  Arm  less than 1   Wound / Incision (Open or Dehisced) 12/22/22 Other (Comment) Buttocks Right 12/22/22  1210  Buttocks  37            Intake/Output Last 24 hours  Intake/Output Summary (Last 24 hours) at 01/28/2023 0743 Last data filed at 01/28/2023 0422 Gross per 24 hour  Intake --  Output 500 ml  Net -500 ml    Labs/Imaging Results for orders placed or performed during the hospital encounter of 01/28/23 (from the past 48 hours)  CBG  monitoring, ED     Status: Abnormal   Collection Time: 01/28/23  1:39 AM  Result Value Ref Range   Glucose-Capillary 129 (H) 70 - 99 mg/dL    Comment: Glucose reference range applies only to samples taken after fasting for at least 8 hours.  Ethanol     Status: None   Collection Time: 01/28/23  2:14 AM  Result Value Ref Range   Alcohol, Ethyl (B) <10 <10 mg/dL    Comment: (NOTE) Lowest detectable limit for serum alcohol is 10 mg/dL.  For medical purposes only. Performed at [facility], [address], [place], [place] [address]   Protime-INR     Status: Abnormal   Collection Time: 01/28/23  2:14 AM  Result Value Ref Range   Prothrombin Time 16.6 (H) 11.4 - 15.2 seconds   INR 1.3 (H) 0.8 - 1.2    Comment: (NOTE) INR goal varies based on device and disease states. Performed at [facility], [address] [place], [place] [address]   APTT     Status: Abnormal   Collection Time: 01/28/23  2:14 AM  Result Value Ref Range   aPTT 39 (H) 24 - 36 seconds    Comment:        IF BASELINE aPTT IS ELEVATED, SUGGEST PATIENT RISK ASSESSMENT BE USED TO DETERMINE APPROPRIATE ANTICOAGULANT THERAPY. Performed at [facility], [address] [place], [place] [address]   CBC     Status: Abnormal   Collection Time: 01/28/23  2:14 AM  Result Value Ref Range   WBC 15.7 (H) 4.0 - 10.5 K/uL   RBC 3.88 (L) 4.22 - 5.81 MIL/uL   Hemoglobin 8.2 (L) 13.0 - 17.0 g/dL   HCT 66.4 (L) 40.3 - 47.4 %   MCV 80.7 80.0 - 100.0 fL   MCH 21.1 (L) 26.0 - 34.0 pg   MCHC 26.2 (L) 30.0 - 36.0 g/dL   RDW 25.9 (H) 56.3 - 87.5 %   Platelets 321 150 - 400 K/uL   nRBC 1.3 (H) 0.0 - 0.2 %    Comment: Performed at [facility], [address] [place], [place] [address]  Differential     Status: Abnormal   Collection Time: 01/28/23  2:14 AM  Result Value Ref Range   Neutrophils Relative % 83 %   Neutro Abs 13.2 (H) 1.7 - 7.7 K/uL   Lymphocytes Relative 6 %   Lymphs Abs 0.9 0.7 - 4.0 K/uL   Monocytes Relative 4 %    Monocytes Absolute 0.6 0.1 - 1.0 K/uL   Eosinophils Relative 5 %   Eosinophils Absolute 0.8 (H) 0.0 - 0.5 K/uL   Basophils Relative 1 %   Basophils Absolute  0.1 0.0 - 0.1 K/uL   Immature Granulocytes 1 %   Abs Immature Granulocytes 0.09 (H) 0.00 - 0.07 K/uL    Comment: Performed at [facility], [address], [place], [place] [address]  Comprehensive metabolic panel     Status: Abnormal   Collection Time: 01/28/23  2:14 AM  Result Value Ref Range   Sodium 137 135 - 145 mmol/L   Potassium 4.2 3.5 - 5.1 mmol/L   Chloride 103 98 - 111 mmol/L   CO2 24 22 - 32 mmol/L   Glucose, Bld 111 (H) 70 - 99 mg/dL    Comment: Glucose reference range applies only to samples taken after fasting for at least 8 hours.   BUN 62 (H) 8 - 23 mg/dL   Creatinine, Ser 6.21 (H) 0.61 - 1.24 mg/dL   Calcium 8.4 (L) 8.9 - 10.3 mg/dL   Total Protein 8.1 6.5 - 8.1 g/dL   Albumin 3.0 (L) 3.5 - 5.0 g/dL   AST 16 15 - 41 U/L   ALT 13 0 - 44 U/L   Alkaline Phosphatase 93 38 - 126 U/L   Total Bilirubin 1.2 0.0 - 1.2 mg/dL   GFR, Estimated 22 (L) >60 mL/min    Comment: (NOTE) Calculated using the CKD-EPI Creatinine Equation (2021)    Anion gap 10 5 - 15    Comment: Performed at [facility], [address], [place], [place] [address]  Urine rapid drug screen (hosp performed)     Status: None   Collection Time: 01/28/23  4:22 AM  Result Value Ref Range   Opiates NONE DETECTED NONE DETECTED   Cocaine NONE DETECTED NONE DETECTED   Benzodiazepines NONE DETECTED NONE DETECTED   Amphetamines NONE DETECTED NONE DETECTED   Tetrahydrocannabinol NONE DETECTED NONE DETECTED   Barbiturates NONE DETECTED NONE DETECTED    Comment: (NOTE) DRUG SCREEN FOR MEDICAL PURPOSES ONLY.  IF CONFIRMATION IS NEEDED FOR ANY PURPOSE, NOTIFY LAB WITHIN 5 DAYS.  LOWEST DETECTABLE LIMITS FOR URINE DRUG SCREEN Drug Class                     Cutoff (ng/mL) Amphetamine and metabolites    1000 Barbiturate and metabolites     200 Benzodiazepine                 200 Opiates and metabolites        300 Cocaine and metabolites        300 THC                            50 Performed at [facility], [address] [place], [place] [address]   Urinalysis, Routine w reflex microscopic -Urine, Catheterized     Status: Abnormal   Collection Time: 01/28/23  4:22 AM  Result Value Ref Range   Color, Urine YELLOW YELLOW   APPearance HAZY (A) CLEAR   Specific Gravity, Urine 1.014 1.005 - 1.030   pH 5.0 5.0 - 8.0   Glucose, UA 150 (A) NEGATIVE mg/dL   Hgb urine dipstick NEGATIVE NEGATIVE   Bilirubin Urine NEGATIVE NEGATIVE   Ketones, ur NEGATIVE NEGATIVE mg/dL   Protein, ur 30 (A) NEGATIVE mg/dL   Nitrite NEGATIVE NEGATIVE   Leukocytes,Ua NEGATIVE NEGATIVE   RBC / HPF 0-5 0 - 5 RBC/hpf   WBC, UA 0-5 0 - 5 WBC/hpf   Bacteria, UA RARE (A) NONE SEEN   Squamous Epithelial / HPF 0-5  0 - 5 /HPF   Mucus PRESENT    Hyaline Casts, UA PRESENT     Comment: Performed at [facility], [address], [place], [place] [address]   CT ANGIO HEAD NECK W WO CM Result Date: 01/28/2023 CLINICAL DATA:  Confusion and hemi-neglect EXAM: CT ANGIOGRAPHY HEAD AND NECK WITH AND WITHOUT CONTRAST TECHNIQUE: Multidetector CT imaging of the head and neck was performed using the standard protocol during bolus administration of intravenous contrast. Multiplanar CT image reconstructions and MIPs were obtained to evaluate the vascular anatomy. Carotid stenosis measurements (when applicable) are obtained utilizing NASCET criteria, using the distal internal carotid diameter as the denominator. RADIATION DOSE REDUCTION: This exam was performed according to the departmental dose-optimization program which includes automated exposure control, adjustment of the mA and/or kV according to patient size and/or use of iterative reconstruction technique. CONTRAST:  75mL OMNIPAQUE IOHEXOL 350 MG/ML SOLN COMPARISON:  None Available. FINDINGS: CTA NECK FINDINGS Skeleton: No  acute abnormality or high grade bony spinal canal stenosis. Other neck: Normal pharynx, larynx and major salivary glands. No cervical lymphadenopathy. Unremarkable thyroid gland. Upper chest: There are multiple enlarged upper right mediastinal lymph nodes that measure up to 19 mm. Aortic arch: There is calcific atherosclerosis of the aortic arch. Conventional 3 vessel aortic branching pattern. RIGHT carotid system: No dissection, occlusion or aneurysm. Mild atherosclerotic calcification at the carotid bifurcation without hemodynamically significant stenosis. LEFT carotid system: No dissection, occlusion or aneurysm. There is mixed density atherosclerosis extending into the proximal ICA, resulting in 50% stenosis. Vertebral arteries: Codominant configuration. There is no dissection, occlusion or flow-limiting stenosis to the skull base (V1-V3 segments). CTA HEAD FINDINGS POSTERIOR CIRCULATION: Vertebral arteries are normal. No proximal occlusion of the anterior or inferior cerebellar arteries. Basilar artery is normal. Superior cerebellar arteries are normal. Posterior cerebral arteries are normal. ANTERIOR CIRCULATION: There is bilateral atherosclerotic calcification of the internal carotid arteries at the skull base with severe stenosis of the distal right cavernous segment and the right clinoid segment. Anterior cerebral arteries are normal. Middle cerebral arteries are normal. Venous sinuses: As permitted by contrast timing, patent. Anatomic variants: None Review of the MIP images confirms the above findings. IMPRESSION: 1. No emergent large vessel occlusion. 2. Severe stenosis of the distal cavernous segment and the right clinoid segment of the right internal carotid artery. 3. A 50% stenosis of the proximal left internal carotid artery. 4. Multiple enlarged upper right mediastinal lymph nodes that measure up to 19 mm. Aortic Atherosclerosis (ICD10-I70.0). Electronically Signed   By: Deatra Robinson M.D.   On:  01/28/2023 02:47   CT HEAD CODE STROKE WO CONTRAST Result Date: 01/28/2023 CLINICAL DATA:  Code stroke.  Confusion and lethargy EXAM: CT HEAD WITHOUT CONTRAST TECHNIQUE: Contiguous axial images were obtained from the base of the skull through the vertex without intravenous contrast. RADIATION DOSE REDUCTION: This exam was performed according to the departmental dose-optimization program which includes automated exposure control, adjustment of the mA and/or kV according to patient size and/or use of iterative reconstruction technique. COMPARISON:  01/28/2023 FINDINGS: Brain: There is no acute hemorrhage. There are multiple old infarcts of the right temporal and occipital lobes. There is hypoattenuation of the periventricular white matter, most commonly indicating chronic ischemic microangiopathy. Right middle crania meningioma is unchanged. Vascular: Atherosclerotic calcification of the internal carotid arteries at the skull base. No abnormal hyperdensity of the major intracranial arteries or dural venous sinuses. Skull: The visualized skull base, calvarium and extracranial soft tissues are normal. Sinuses/Orbits: No  fluid levels or advanced mucosal thickening of the visualized paranasal sinuses. No mastoid or middle ear effusion. The orbits are normal. ASPECTS [facility] Stroke Program Early CT Score) - Ganglionic level infarction (caudate, lentiform nuclei, internal capsule, insula, M1-M3 cortex): 7 - Supraganglionic infarction (M4-M6 cortex): 3 Total score (0-10 with 10 being normal): 10 IMPRESSION: 1. No acute intracranial hemorrhage. 2. ASPECTS is 10. 3. 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 These results were called by telephone at the time of interpretation on 01/28/2023 at 2:32 am to provider [facility] , who verbally acknowledged these results. Electronically Signed   By: Deatra Robinson M.D.   On: 01/28/2023 02:33    Pending Labs Unresulted Labs (From admission, onward)      Start     Ordered   01/29/23 0500  Lipid panel  (Labs)  Tomorrow morning,   R       Comments: Fasting    01/28/23 0733   01/29/23 0500  Basic metabolic panel  Daily,   R      01/28/23 0733   01/29/23 0500  Magnesium  Tomorrow morning,   R        01/28/23 0733   01/29/23 0500  CBC with Differential/Platelet  Daily,   R      01/28/23 0735   01/28/23 0736  Culture, blood (Routine X 2) w Reflex to ID Panel  BLOOD CULTURE X 2,   R (with TIMED occurrences)      01/28/23 0735   01/28/23 0728  Hemoglobin A1c  (Labs)  Add-on,   AD       Comments: To assess prior glycemic control    01/28/23 0733   01/28/23 0727  CBC  (heparin)  Once,   R       Comments: Baseline for heparin therapy IF NOT ALREADY DRAWN.  Notify MD if PLT < 100 K.    01/28/23 0733   01/28/23 0727  Creatinine, serum  (heparin)  Once,   R       Comments: Baseline for heparin therapy IF NOT ALREADY DRAWN.    01/28/23 0733            Vitals/Pain Today's Vitals   01/28/23 0646 01/28/23 0700 01/28/23 0715 01/28/23 0730  BP:  100/68 94/64 101/68  Pulse:      Resp:  12 11 11   Temp: 97.8 F (36.6 C)     TempSrc: Axillary     SpO2:      Weight:      Height:        Isolation Precautions No active isolations  Medications Medications  aspirin chewable tablet 324 mg (324 mg Oral Not Given 01/28/23 0328)   stroke: early stages of recovery book (has no administration in time range)  0.9 %  sodium chloride infusion (has no administration in time range)  acetaminophen (TYLENOL) tablet 650 mg (has no administration in time range)    Or  acetaminophen (TYLENOL) 160 MG/5ML solution 650 mg (has no administration in time range)    Or  acetaminophen (TYLENOL) suppository 650 mg (has no administration in time range)  senna-docusate (Senokot-S) tablet 1 tablet (has no administration in time range)  heparin injection 5,000 Units (has no administration in time range)  insulin aspart (novoLOG) injection 0-9 Units (has no  administration in time range)  aspirin suppository 300 mg (has no administration in time range)    Or  aspirin tablet 325 mg (has no administration in time range)  iohexol (OMNIPAQUE) 350 MG/ML injection 75 mL (75 mLs Intravenous Contrast Given 01/28/23 0218)    Mobility non-ambulatory     Focused Assessments    R Recommendations: See Admitting Provider Note  Report given to:   Additional Notes:

## 2023-01-28 NOTE — Progress Notes (Addendum)
0157 - Code Stroke Activated   LKWT unknown, mRS 2. Lethargy and confusion  0159 - Patient to CT   0206 - Tele-Neurologist paged   0226 - Dr. Grandville Silos joined stroke cart  [phone] - Patient left to CT to room   0252 - CT head & CTA results sent to Dr. Grandville Silos

## 2023-01-28 NOTE — H&P (Addendum)
History and Physical  [facility]  [name]:[id number] DOB: [birth year] DOA: 01/28/2023  PCP: Benetta Spar, MD  Patient coming from: [facility]  Level of care: Stepdown  I have personally briefly reviewed patient's old medical records in [facility] Link  Chief Complaint: Altered Mental Status   HPI: [name] is a 68 year old male with chronic combined systolic and diastolic heart failure, hyperlipidemia, type 2 diabetes mellitus, diabetic polyneuropathy, hypertension, polysubstance abuse, stage III CKD history of cocaine abuse, failure to thrive, coronary artery disease, ischemic cardiomyopathy, history of LV mural thrombus, OSA, history of Fournier gangrene, GERD, who was recently hospitalized for acute heart failure and discharged to [facility].  He was sent to the ED by EMS for lethargic and confusion since about 7 PM that evening.  He reportedly has normally been alert and oriented.  He was not answering questions.  Stroke was suspected and he was seen by teleneurologist.  TNK was not given as he was outside the window.  Neurology recommended MRI brain and routine stroke workup.  They recommended aspirin 325 mg daily.   Past Medical History:  Diagnosis Date   Bulging lumbar disc    CAD (coronary artery disease) [phone]   a. prior LAD stenting. b. s/p DES to [facility] 08/2015. c. 04/2016 Cardiac cath at [facility]. Patent stent in the PLAD and RCA. Diffuse dLAD, OM2, and  RPDA disease. d. DES to PDA and distal RCA 03/2019    Chronic lower back pain    CKD (chronic kidney disease), stage II    Cocaine abuse (HCC)    Cyst of epididymis    DM2 (diabetes mellitus, type 2) (HCC)    Essential hypertension    Fournier gangrene    GERD (gastroesophageal reflux disease)    Headache    History of pneumonia    Hyperlipidemia    Ischemic cardiomyopathy    LV (left ventricular) mural thrombus    Sleep apnea     Past Surgical History:   Procedure Laterality Date   APPENDECTOMY     BIOPSY  11/11/2022   Procedure: BIOPSY;  Surgeon: Franky Macho, MD;  [facility];  Service: Endoscopy;;   BRONCHIAL NEEDLE ASPIRATION BIOPSY  11/12/2021   Procedure: BRONCHIAL NEEDLE ASPIRATION BIOPSIES;  Surgeon: Omar Person, MD;  Location: [facility] ENDOSCOPY;  Service: Pulmonary;;   CARDIAC CATHETERIZATION N/A 09/07/2015   Procedure: Left Heart Cath and Coronary Angiography;  Surgeon: Marykay Lex, MD;  Location: [facility] INVASIVE CV LAB;  Service: Cardiovascular;  Laterality: N/A;   CARDIAC CATHETERIZATION N/A 09/07/2015   Procedure: Coronary Stent Intervention;  Surgeon: Marykay Lex, MD;  Location: [facility] INVASIVE CV LAB;  Service: Cardiovascular;  Laterality: N/A;   COLONOSCOPY WITH PROPOFOL N/A 11/11/2022   Procedure: COLONOSCOPY WITH PROPOFOL;  Surgeon: Franky Macho, MD;  [facility]: [facility];  Service: Endoscopy;  Laterality: N/A;   CORONARY ANGIOGRAM  09/07/13   residual RCA and OM disease   CORONARY ANGIOPLASTY WITH STENT PLACEMENT     CORONARY STENT INTERVENTION N/A 10/16/2018   Procedure: CORONARY STENT INTERVENTION;  Surgeon: Kathleene Hazel, MD;  Location: [facility] INVASIVE CV LAB;  Service: Cardiovascular;  Laterality: N/A;   CORONARY STENT INTERVENTION N/A 03/11/2019   Procedure: CORONARY STENT INTERVENTION;  Surgeon: Corky Crafts, MD;  Location: [facility] INVASIVE CV LAB;  Service: Cardiovascular;  Laterality: N/A;   ESOPHAGOGASTRODUODENOSCOPY (EGD) WITH PROPOFOL N/A 11/11/2022   Procedure: ESOPHAGOGASTRODUODENOSCOPY (EGD)  WITH PROPOFOL;  Surgeon: Franky Macho, MD;  [facility];  Service: Endoscopy;  Laterality: N/A;   FRACTURE SURGERY     HEMOSTASIS CLIP PLACEMENT  11/11/2022   Procedure: HEMOSTASIS CLIP PLACEMENT;  Surgeon: Franky Macho, MD;  [facility];  Service: Endoscopy;;   INCISION AND DRAINAGE OF WOUND Left 05/19/2019   Procedure: DEBRIDEMENT LEFT GROIN;  Surgeon: Violeta Gelinas, MD;  Location: [facility] OR;  Service: General;  Laterality: Left;   INSERTION OF ILIAC STENT Left 11/30/2017   Left external illiac stent   INSERTION OF ILIAC STENT  11/30/2017   Procedure: Insertion Of Iliac Stent;  Surgeon: Runell Gess, MD;  Location: [facility] INVASIVE CV LAB;  Service: Cardiovascular;;  Left external illiac stent   KNEE ARTHROSCOPY Left    KNEE SURGERY     "ligaments, cartilage; tendon, put a pin in" (11/30/2017)   LEFT HEART CATH Bilateral 07/08/2012   Procedure: LEFT HEART CATH;  Surgeon: Corky Crafts, MD;  Location: [facility] CATH LAB;  Service: Cardiovascular;  Laterality: Bilateral;   LEFT HEART CATH AND CORONARY ANGIOGRAPHY N/A 10/16/2018   Procedure: LEFT HEART CATH AND CORONARY ANGIOGRAPHY;  Surgeon: Kathleene Hazel, MD;  Location: [facility] INVASIVE CV LAB;  Service: Cardiovascular;  Laterality: N/A;   LEFT HEART CATH AND CORONARY ANGIOGRAPHY N/A 03/11/2019   Procedure: LEFT HEART CATH AND CORONARY ANGIOGRAPHY;  Surgeon: Corky Crafts, MD;  Location: [facility] INVASIVE CV LAB;  Service: Cardiovascular;  Laterality: N/A;   LEFT HEART CATHETERIZATION WITH CORONARY ANGIOGRAM N/A 09/06/2013   STEMI, 2nd ISR LAD. Procedure: LEFT HEART CATHETERIZATION WITH CORONARY ANGIOGRAM;  Surgeon: Corky Crafts, MD;  Location: [facility] CATH LAB;  Service: Cardiovascular;  Laterality: N/A;   LOWER EXTREMITY ANGIOGRAPHY N/A 11/30/2017   Procedure: LOWER EXTREMITY ANGIOGRAPHY;  Surgeon: Runell Gess, MD;  Location: [facility] INVASIVE CV LAB;  Service: Cardiovascular;  Laterality: N/A;   PERCUTANEOUS CORONARY STENT INTERVENTION (PCI-S)  07/08/2012   Procedure: PERCUTANEOUS CORONARY STENT INTERVENTION (PCI-S);  Surgeon: Corky Crafts, MD;  Location: [facility] CATH LAB;  Service: Cardiovascular;;  DES LAD   PERCUTANEOUS CORONARY STENT INTERVENTION (PCI-S) N/A 09/06/2013   Procedure: PERCUTANEOUS CORONARY STENT INTERVENTION (PCI-S);  Surgeon: Corky Crafts, MD;  Location: [facility] CATH LAB;   Service: Cardiovascular;  Laterality: N/A;  Mid LAD 3.0/24mm Promus   POLYPECTOMY  11/11/2022   Procedure: POLYPECTOMY INTESTINAL;  Surgeon: Franky Macho, MD;  [facility];  Service: Endoscopy;;   SCLEROTHERAPY  11/11/2022   Procedure: Susa Day;  Surgeon: Franky Macho, MD;  [facility];  Service: Endoscopy;;   SUBMUCOSAL TATTOO INJECTION  11/11/2022   Procedure: SUBMUCOSAL TATTOO INJECTION;  Surgeon: Franky Macho, MD;  [facility];  Service: Endoscopy;;   THORACENTESIS Right 11/10/2021   Procedure: Alanson Puls;  Surgeon: Leslye Peer, MD;  Location: [facility] ENDOSCOPY;  Service: Cardiopulmonary;  Laterality: Right;   VIDEO BRONCHOSCOPY WITH ENDOBRONCHIAL ULTRASOUND Right 11/12/2021   Procedure: VIDEO BRONCHOSCOPY WITH ENDOBRONCHIAL ULTRASOUND;  Surgeon: Omar Person, MD;  Location: [facility] ENDOSCOPY;  Service: Pulmonary;  Laterality: Right;   WOUND DEBRIDEMENT Left 05/20/2019   Procedure: DEBRIDEMENT GROIN;  Surgeon: Kinsinger, De Blanch, MD;  Location: [facility] OR;  Service: General;  Laterality: Left;   WRIST FRACTURE SURGERY Bilateral      reports that he has been smoking cigarettes. He has a 24 pack-year smoking history. He has been exposed to tobacco smoke. He has never used smokeless tobacco.  He reports that he does not currently use alcohol. He reports that he does not currently use drugs after having used the following drugs: Cocaine.  Allergies  Allergen Reactions   Clopidogrel Other (See Comments)    Drowsy, Skin irritation    Family History  Problem Relation Age of Onset   Hypertension Mother    Diabetes Mother     Prior to Admission medications   Medication Sig Start Date End Date Taking? Authorizing Provider  aspirin EC (ASPIRIN LOW DOSE) 81 MG tablet Take 1 tablet (81 mg total) by mouth daily. Swallow whole. 11/14/22   Catarina Hartshorn, MD  atorvastatin (LIPITOR) 40 MG tablet Take 1 tablet (40 mg total) by mouth at bedtime. 10/25/22  01/14/23  Vassie Loll, MD  carvedilol (COREG) 3.125 MG tablet Take 1 tablet (3.125 mg total) by mouth 2 (two) times daily with a meal. 10/25/22 01/14/23  Vassie Loll, MD  ferrous sulfate 325 (65 FE) MG tablet Take 1 tablet (325 mg total) by mouth daily with breakfast. 11/14/22   Tat, Onalee Hua, MD  folic acid (FOLVITE) 1 MG tablet Take 1 tablet (1 mg total) by mouth daily. 11/14/22   Catarina Hartshorn, MD  gabapentin (NEURONTIN) 300 MG capsule Take 1 capsule (300 mg total) by mouth 3 (three) times daily. 10/25/22   Vassie Loll, MD  insulin glargine (LANTUS) 100 UNIT/ML Solostar Pen Inject 5 Units into the skin daily. Patient taking differently: Inject 5 Units into the skin at bedtime. 11/15/22   Catarina Hartshorn, MD  JARDIANCE 10 MG TABS tablet Take 10 mg by mouth daily. 01/02/23   [provider]  linaclotide Karlene Einstein) 145 MCG CAPS capsule Take 1 capsule (145 mcg total) by mouth daily before breakfast. 12/24/22   Tat, Onalee Hua, MD  loperamide (IMODIUM) 2 MG capsule Take 1 capsule (2 mg total) by mouth 4 (four) times daily as needed for diarrhea or loose stools. 01/23/23   Gilda Crease, MD  midodrine (PROAMATINE) 10 MG tablet Take 1 tablet (10 mg total) by mouth 3 (three) times daily with meals. 12/24/22   Catarina Hartshorn, MD  ondansetron (ZOFRAN-ODT) 4 MG disintegrating tablet 4mg  ODT q4 hours prn nausea/vomit 01/23/23   Pollina, Canary Brim, MD  pantoprazole (PROTONIX) 40 MG tablet Take 1 tablet (40 mg total) by mouth daily. 10/26/22   Vassie Loll, MD  polyethylene glycol (MIRALAX / GLYCOLAX) 17 g packet Take 17 g by mouth daily as needed for mild constipation. 01/17/23   Vassie Loll, MD  prochlorperazine (COMPAZINE) 10 MG tablet Take 1 tablet (10 mg total) by mouth every 6 (six) hours as needed for nausea or vomiting. 01/21/23   Dione Booze, MD  torsemide (DEMADEX) 20 MG tablet Take 2 tablets (40 mg total) by mouth daily. 01/17/23   Vassie Loll, MD    Physical Exam: Vitals:   01/28/23 0910  01/28/23 1039 01/28/23 1100 01/28/23 1105  BP:  116/71 96/61   Pulse:  75 70   Resp:  13 11   Temp:    (!) 97.5 F (36.4 C)  TempSrc:    Oral  SpO2: (!) 89% 100% 100%   Weight:      Height:        Constitutional: pt is mostly nonverbal, he is encephalopathic and barely able to follow commands.  He is not using his left upper or lower extremity at this time.   Eyes: PERRL, lids and conjunctivae normal ENMT: Mucous membranes are dry with thick drooling from mouth. Posterior pharynx clear  of any exudate or lesions.  poor dentition.  Neck: normal, supple, no masses, no thyromegaly Respiratory: rales heard LUL, LLL. Moderate increased work of breathing. No accessory muscle use.  Cardiovascular: normal s1, s2 sounds, no murmurs / rubs / gallops. No extremity edema. 2+ pedal pulses. No carotid bruits.  Abdomen: no tenderness, no masses palpated. No hepatosplenomegaly. Bowel sounds positive.  Musculoskeletal: no clubbing / cyanosis. No joint deformity upper and lower extremities. Good ROM, no contractures. Normal muscle tone.  Skin: no rashes, lesions, ulcers. No induration Neurologic: left hemiparesis. Encephalopathy; not following commands.  Psychiatric: poor judgment and insight. Somnolent but arousable.  Labs on Admission: I have personally reviewed following labs and imaging studies  CBC: Recent Labs  Lab 01/22/23 2233 01/28/23 0214  WBC 8.4 15.7*  NEUTROABS 6.0 13.2*  HGB 7.0* 8.2*  HCT 25.8* 31.3*  MCV 79.1* 80.7  PLT 182 321   Basic Metabolic Panel: Recent Labs  Lab 01/22/23 2233 01/28/23 0214  NA 132* 137  K 3.8 4.2  CL 100 103  CO2 25 24  GLUCOSE 148* 111*  BUN 55* 62*  CREATININE 2.18* 3.03*  CALCIUM 7.7* 8.4*   GFR: Estimated Creatinine Clearance: 26.6 mL/min (A) (by C-G formula based on SCr of 3.03 mg/dL (H)). Liver Function Tests: Recent Labs  Lab 01/22/23 2233 01/28/23 0214  AST 27 16  ALT 14 13  ALKPHOS 86 93  BILITOT 0.9 1.2  PROT 6.8 8.1   ALBUMIN 2.4* 3.0*   Recent Labs  Lab 01/22/23 2233  LIPASE 33   No results for input(s): "AMMONIA" in the last 168 hours. Coagulation Profile: Recent Labs  Lab 01/28/23 0214  INR 1.3*   Cardiac Enzymes: No results for input(s): "CKTOTAL", "CKMB", "CKMBINDEX", "TROPONINI" in the last 168 hours. BNP (last 3 results) No results for input(s): "PROBNP" in the last 8760 hours. HbA1C: Recent Labs    01/28/23 0728  HGBA1C 7.2*   CBG: Recent Labs  Lab 01/22/23 2151 01/28/23 0139 01/28/23 1154  GLUCAP 136* 129* 81   Lipid Profile: No results for input(s): "CHOL", "HDL", "LDLCALC", "TRIG", "CHOLHDL", "LDLDIRECT" in the last 72 hours. Thyroid Function Tests: No results for input(s): "TSH", "T4TOTAL", "FREET4", "T3FREE", "THYROIDAB" in the last 72 hours. Anemia Panel: No results for input(s): "VITAMINB12", "FOLATE", "FERRITIN", "TIBC", "IRON", "RETICCTPCT" in the last 72 hours. Urine analysis:    Component Value Date/Time   COLORURINE YELLOW 01/28/2023 0422   APPEARANCEUR HAZY (A) 01/28/2023 0422   APPEARANCEUR Clear 08/02/2021 1051   LABSPEC 1.014 01/28/2023 0422   PHURINE 5.0 01/28/2023 0422   GLUCOSEU 150 (A) 01/28/2023 0422   HGBUR NEGATIVE 01/28/2023 0422   BILIRUBINUR NEGATIVE 01/28/2023 0422   BILIRUBINUR Negative 08/02/2021 1051   KETONESUR NEGATIVE 01/28/2023 0422   PROTEINUR 30 (A) 01/28/2023 0422   UROBILINOGEN 0.2 09/27/2014 2237   NITRITE NEGATIVE 01/28/2023 0422   LEUKOCYTESUR NEGATIVE 01/28/2023 0422    Radiological Exams on Admission: MR BRAIN WO CONTRAST Result Date: 01/28/2023 CLINICAL DATA:  Altered mental status with acute stroke suspected. Left-sided weakness EXAM: MRI HEAD WITHOUT CONTRAST TECHNIQUE: Multiplanar, multiecho pulse sequences of the brain and surrounding structures were obtained without intravenous contrast. COMPARISON:  Head CT and CTA from earlier today FINDINGS: Brain: Small acute infarct in the left subinsular white matter.  Extra-axial mass in the right middle cranial fossa with swelling of the adjacent temporal lobe, history of meningioma. Moderate chronic infarct in the right occipital lobe. Generalized brain atrophy with chronic small vessel ischemia in the  deep cerebral white matter. Chronic microhemorrhage in the central pons. Vascular: Normal flow voids. Skull and upper cervical spine: No acute finding Sinuses/Orbits: Right mastoid opacification with negative nasopharynx, progressed from before. Left mastoid opacification has improved. IMPRESSION: 1. Small acute infarct in the left insular white matter. 2. Known meningioma in the right middle cranial fossa with edema in the adjacent temporal lobe similar to 11/06/2022. 3. Chronic ischemic injury as described. Electronically Signed   By: Tiburcio Pea M.D.   On: 01/28/2023 10:20   DG CHEST PORT 1 VIEW Result Date: 01/28/2023 CLINICAL DATA:  68 year old male with history of leukocytosis. EXAM: PORTABLE CHEST 1 VIEW COMPARISON:  Chest x-ray 01/22/2023. FINDINGS: Lung volumes are low. Opacity in the right mid to lower lung which may reflect underlying atelectasis and/or consolidation. Small right pleural effusion. No left pleural effusion. No pneumothorax. Cephalization of the pulmonary vasculature, without frank pulmonary edema. Moderate cardiomegaly. The patient is rotated to the right on today's exam, resulting in distortion of the mediastinal contours and reduced diagnostic sensitivity and specificity for mediastinal pathology. IMPRESSION: 1. Low lung volumes with atelectasis and/or consolidation in the right mid to lower lung. 2. Small right pleural effusion. 3. Cardiomegaly with pulmonary venous congestion, but no frank pulmonary edema. Electronically Signed   By: Trudie Reed M.D.   On: 01/28/2023 07:56   CT ANGIO HEAD NECK W WO CM Result Date: 01/28/2023 CLINICAL DATA:  Confusion and hemi-neglect EXAM: CT ANGIOGRAPHY HEAD AND NECK WITH AND WITHOUT CONTRAST  TECHNIQUE: Multidetector CT imaging of the head and neck was performed using the standard protocol during bolus administration of intravenous contrast. Multiplanar CT image reconstructions and MIPs were obtained to evaluate the vascular anatomy. Carotid stenosis measurements (when applicable) are obtained utilizing NASCET criteria, using the distal internal carotid diameter as the denominator. RADIATION DOSE REDUCTION: This exam was performed according to the departmental dose-optimization program which includes automated exposure control, adjustment of the mA and/or kV according to patient size and/or use of iterative reconstruction technique. CONTRAST:  75mL OMNIPAQUE IOHEXOL 350 MG/ML SOLN COMPARISON:  None Available. FINDINGS: CTA NECK FINDINGS Skeleton: No acute abnormality or high grade bony spinal canal stenosis. Other neck: Normal pharynx, larynx and major salivary glands. No cervical lymphadenopathy. Unremarkable thyroid gland. Upper chest: There are multiple enlarged upper right mediastinal lymph nodes that measure up to 19 mm. Aortic arch: There is calcific atherosclerosis of the aortic arch. Conventional 3 vessel aortic branching pattern. RIGHT carotid system: No dissection, occlusion or aneurysm. Mild atherosclerotic calcification at the carotid bifurcation without hemodynamically significant stenosis. LEFT carotid system: No dissection, occlusion or aneurysm. There is mixed density atherosclerosis extending into the proximal ICA, resulting in 50% stenosis. Vertebral arteries: Codominant configuration. There is no dissection, occlusion or flow-limiting stenosis to the skull base (V1-V3 segments). CTA HEAD FINDINGS POSTERIOR CIRCULATION: Vertebral arteries are normal. No proximal occlusion of the anterior or inferior cerebellar arteries. Basilar artery is normal. Superior cerebellar arteries are normal. Posterior cerebral arteries are normal. ANTERIOR CIRCULATION: There is bilateral atherosclerotic  calcification of the internal carotid arteries at the skull base with severe stenosis of the distal right cavernous segment and the right clinoid segment. Anterior cerebral arteries are normal. Middle cerebral arteries are normal. Venous sinuses: As permitted by contrast timing, patent. Anatomic variants: None Review of the MIP images confirms the above findings. IMPRESSION: 1. No emergent large vessel occlusion. 2. Severe stenosis of the distal cavernous segment and the right clinoid segment of the right internal carotid artery. 3.  A 50% stenosis of the proximal left internal carotid artery. 4. Multiple enlarged upper right mediastinal lymph nodes that measure up to 19 mm. Aortic Atherosclerosis (ICD10-I70.0). Electronically Signed   By: Deatra Robinson M.D.   On: 01/28/2023 02:47   CT HEAD CODE STROKE WO CONTRAST Result Date: 01/28/2023 CLINICAL DATA:  Code stroke.  Confusion and lethargy EXAM: CT HEAD WITHOUT CONTRAST TECHNIQUE: Contiguous axial images were obtained from the base of the skull through the vertex without intravenous contrast. RADIATION DOSE REDUCTION: This exam was performed according to the departmental dose-optimization program which includes automated exposure control, adjustment of the mA and/or kV according to patient size and/or use of iterative reconstruction technique. COMPARISON:  01/28/2023 FINDINGS: Brain: There is no acute hemorrhage. There are multiple old infarcts of the right temporal and occipital lobes. There is hypoattenuation of the periventricular white matter, most commonly indicating chronic ischemic microangiopathy. Right middle crania meningioma is unchanged. Vascular: Atherosclerotic calcification of the internal carotid arteries at the skull base. No abnormal hyperdensity of the major intracranial arteries or dural venous sinuses. Skull: The visualized skull base, calvarium and extracranial soft tissues are normal. Sinuses/Orbits: No fluid levels or advanced mucosal  thickening of the visualized paranasal sinuses. No mastoid or middle ear effusion. The orbits are normal. ASPECTS [facility] Stroke Program Early CT Score) - Ganglionic level infarction (caudate, lentiform nuclei, internal capsule, insula, M1-M3 cortex): 7 - Supraganglionic infarction (M4-M6 cortex): 3 Total score (0-10 with 10 being normal): 10 IMPRESSION: 1. No acute intracranial hemorrhage. 2. ASPECTS is 10. 3. 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 These results were called by telephone at the time of interpretation on 01/28/2023 at 2:32 am to provider [facility] , who verbally acknowledged these results. Electronically Signed   By: Deatra Robinson M.D.   On: 01/28/2023 02:33   Assessment/Plan Active Problems:   Acute metabolic encephalopathy   Hypertensive heart disease   Tobacco use disorder   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   Coronary artery disease   Ischemic cardiomyopathy   Essential hypertension   Status post coronary artery stent placement   History of MI (myocardial infarction)   Cocaine use   OSA on CPAP   PAD (peripheral artery disease) (HCC)   Cocaine abuse (HCC)   Hyperlipidemia, mixed   CHF (congestive heart failure) (HCC)   Frequent falls   Acute ischemic CVA  - MRI brain positive for small acute infarct in the left insular white matter - ASA 325 mg daily ordered - encephalopathy worsened this morning and was transferred to stepdown ICU for bipap - reached out to neurology on call for further recommendations - neuro checks ordered - check A1c, lipids - pt had recent TTE last month - IV fluids ordered - permissive hypertension  - NPO until he has been evaluated by SLP - discussed with neurology, get EEG on Monday  Acute respiratory failure with hypoxia - suspect aspiration pneumonia - IV ampicillin sulbactam ordered after blood cultures - stat ABG ordered and RT consultation  - BIPAP therapy ordered and transfer to stepdown  ICU - scheduled bronchodilators Duonebs Q4h  Acute respiratory acidosis - management as above with bipap therapy  - follow VBG   AKI on CKD stage 3b - creatinine markedly elevated today - he is likely dry from diuretics and poor oral intake - IV fluid hydration ordered (gentle given cardiomyopathy) - renally dose medications - follow BMP daily   Type 2 DM uncontrolled with vascular  complications - A1c 7.8% - SSI coverage for now with frequent CBG testing  History of polysubstance abuse - he is currently residing in a SNF - UDS negative   Leukocytosis  - suspect secondary to aspiration pneumonia - treating as above - follow CBC with diff   Normocytic Anemia - follow CBC closely  - transfuse for Hg<7 - continue daily iron supplement    Hyperlipidemia - check fasting lipid panel - resume home atorvastatin daily 40 mg  Essential Hypertension  - allowing permissive hypertension in setting of acute CVA per neuro recs  Ischemic cardiomyopathy  - recent TTE 12/24 LVEF 30-35%  - holding home torsemide 40 mg daily   Critical Care Procedure Note Authorized and Performed by: Maryln Manuel MD  Total Critical Care time:  65 mins Due to a high probability of clinically significant, life threatening deterioration, the patient required my highest level of preparedness to intervene emergently and I personally spent this critical care time directly and personally managing the patient.  This critical care time included obtaining a history; examining the patient, pulse oximetry; ordering and review of studies; arranging urgent treatment with development of a management plan; evaluation of patient's response of treatment; frequent reassessment; and discussions with other providers.  This critical care time was performed to assess and manage the high probability of imminent and life threatening deterioration that could result in multi-organ failure.  It was exclusive of separately billable  procedures and treating other patients and teaching time.   DVT prophylaxis: sq heparin   Code Status: Full   Family Communication: telephone call 1/18 no answer Disposition Plan: TBD  Consults called: neuro  Admission status: INP  Level of care: Stepdown Standley Dakins MD [facility] How to contact the [facility] Attending or Consulting provider 7A - 7P or covering provider during after hours 7P -7A, for this patient?  Check the care team in [facility] and look for a) attending/consulting TR[facility] provider listed and b) the [facility] team listed Log into [email or web address] and use [place]'s universal password to access. If you do not have the password, please contact the hospital operator. Locate the [facility] provider you are looking for under [facility] and page to a number that you can be directly reached. If you still have difficulty reaching the provider, please page the [facility] (Director on Call) for the Hospitalists listed on amion for assistance.   If 7PM-7AM, please contact night-coverage [email or web address] Password [facility]  01/28/2023, 12:02 PM

## 2023-01-28 NOTE — ED Notes (Signed)
Daughter called to get status update on patient.  Updated daughter at this time.  Daughter reports she last spoke to the patient on Tuesday and he reported to her that he was "not feeling good."  Per report, patient recently had the "Norovirus."  At baseline patient is alert, oriented, and communicates.    Daughter would like to be called with any updates, status changes, or questions.

## 2023-01-28 NOTE — Progress Notes (Signed)
Patient arrived to unit at 0904, VSS except for O2 sat of 63% on room air. Patient placed on 2L and O2 increased to the 70s, placed on 4L and O2 sat 88-94%. Patient lethargic and only responsive to pain. Dr. Laural Benes made aware and at bedside. Transfer order placed to stepdown. Erie Noe, RRT at bedside, advised by Dr. Laural Benes of need for BiPAP. Patient transported to MRI accompanied by this nurse. Patient transferred from MRI to ICU. Report given by primary nurse, Darel Hong.

## 2023-01-28 NOTE — ED Provider Notes (Signed)
[place] EMERGENCY DEPARTMENT AT [facility] Provider Note   CSN: [id number] Arrival date & time: 01/28/23  1191  An emergency department physician performed an initial assessment on this suspected stroke patient at 0206.  History  Chief Complaint  Patient presents with   Altered Mental Status    KAILER ZIPAY is a 68 y.o. male.  HPI     This is a 68 year old male with a history of chronic kidney disease, coronary artery disease, ischemic cardiomyopathy, diabetes who presents with altered mental status.  Per EMS he was last seen normal before 7 PM.  Unable to get further details regarding this.  Reportedly more confused and lethargic.  Normally A/O x 3.  Patient is following simple commands.  He has some drooling out of the right side of his mouth.  Left-sided neglect noted.  Level 5 caveat  Home Medications Prior to Admission medications   Medication Sig Start Date End Date Taking? Authorizing Provider  aspirin EC (ASPIRIN LOW DOSE) 81 MG tablet Take 1 tablet (81 mg total) by mouth daily. Swallow whole. 11/14/22   Catarina Hartshorn, MD  atorvastatin (LIPITOR) 40 MG tablet Take 1 tablet (40 mg total) by mouth at bedtime. 10/25/22 01/14/23  Vassie Loll, MD  carvedilol (COREG) 3.125 MG tablet Take 1 tablet (3.125 mg total) by mouth 2 (two) times daily with a meal. 10/25/22 01/14/23  Vassie Loll, MD  ferrous sulfate 325 (65 FE) MG tablet Take 1 tablet (325 mg total) by mouth daily with breakfast. 11/14/22   Tat, Onalee Hua, MD  folic acid (FOLVITE) 1 MG tablet Take 1 tablet (1 mg total) by mouth daily. 11/14/22   Catarina Hartshorn, MD  gabapentin (NEURONTIN) 300 MG capsule Take 1 capsule (300 mg total) by mouth 3 (three) times daily. 10/25/22   Vassie Loll, MD  insulin glargine (LANTUS) 100 UNIT/ML Solostar Pen Inject 5 Units into the skin daily. Patient taking differently: Inject 5 Units into the skin at bedtime. 11/15/22   Catarina Hartshorn, MD  JARDIANCE 10 MG TABS tablet Take 10 mg by  mouth daily. 01/02/23   [provider]  linaclotide Karlene Einstein) 145 MCG CAPS capsule Take 1 capsule (145 mcg total) by mouth daily before breakfast. 12/24/22   Tat, Onalee Hua, MD  loperamide (IMODIUM) 2 MG capsule Take 1 capsule (2 mg total) by mouth 4 (four) times daily as needed for diarrhea or loose stools. 01/23/23   Gilda Crease, MD  midodrine (PROAMATINE) 10 MG tablet Take 1 tablet (10 mg total) by mouth 3 (three) times daily with meals. 12/24/22   Catarina Hartshorn, MD  ondansetron (ZOFRAN-ODT) 4 MG disintegrating tablet 4mg  ODT q4 hours prn nausea/vomit 01/23/23   Pollina, Canary Brim, MD  pantoprazole (PROTONIX) 40 MG tablet Take 1 tablet (40 mg total) by mouth daily. 10/26/22   Vassie Loll, MD  polyethylene glycol (MIRALAX / GLYCOLAX) 17 g packet Take 17 g by mouth daily as needed for mild constipation. 01/17/23   Vassie Loll, MD  prochlorperazine (COMPAZINE) 10 MG tablet Take 1 tablet (10 mg total) by mouth every 6 (six) hours as needed for nausea or vomiting. 01/21/23   Dione Booze, MD  torsemide (DEMADEX) 20 MG tablet Take 2 tablets (40 mg total) by mouth daily. 01/17/23   Vassie Loll, MD      Allergies    Clopidogrel    Review of Systems   Review of Systems  Unable to perform ROS: Mental status change    Physical Exam Updated Vital  Signs BP 112/68 (BP Location: Right Arm)   Pulse 68   Temp 97.9 F (36.6 C) (Oral)   Resp 18   Ht 1.753 m (5\' 9" )   Wt 95.3 kg   BMI 31.03 kg/m  Physical Exam Vitals and nursing note reviewed.  Constitutional:      Appearance: He is well-developed.     Comments: Chronically ill-appearing, ABCs intact  HENT:     Head: Normocephalic and atraumatic.     Mouth/Throat:     Comments: When out of the right side of his mouth, dysarthria noted Eyes:     Pupils: Pupils are equal, round, and reactive to light.  Cardiovascular:     Rate and Rhythm: Normal rate and regular rhythm.     Heart sounds: Normal heart sounds.  Pulmonary:      Effort: Pulmonary effort is normal. No respiratory distress.     Breath sounds: Normal breath sounds.  Abdominal:     Palpations: Abdomen is soft.     Tenderness: There is no abdominal tenderness. There is no rebound.  Musculoskeletal:     Cervical back: Neck supple.  Lymphadenopathy:     Cervical: No cervical adenopathy.  Skin:    General: Skin is warm and dry.  Neurological:     Mental Status: He is alert.     Comments: Difficulty answering orientation questions.  Does follow simple commands, appears to have some weakness on the left although it is unclear whether he fully understands what I am asking of him.  I stood on his left side and he appeared to have some left-sided neglect.  Difficult exam secondary to mental status changes     ED Results / Procedures / Treatments   Labs (all labs ordered are listed, but only abnormal results are displayed) Labs Reviewed  PROTIME-INR - Abnormal; Notable for the following components:      Result Value   Prothrombin Time 16.6 (*)    INR 1.3 (*)    All other components within normal limits  APTT - Abnormal; Notable for the following components:   aPTT 39 (*)    All other components within normal limits  CBC - Abnormal; Notable for the following components:   WBC 15.7 (*)    RBC 3.88 (*)    Hemoglobin 8.2 (*)    HCT 31.3 (*)    MCH 21.1 (*)    MCHC 26.2 (*)    RDW 23.2 (*)    nRBC 1.3 (*)    All other components within normal limits  DIFFERENTIAL - Abnormal; Notable for the following components:   Neutro Abs 13.2 (*)    Eosinophils Absolute 0.8 (*)    Abs Immature Granulocytes 0.09 (*)    All other components within normal limits  COMPREHENSIVE METABOLIC PANEL - Abnormal; Notable for the following components:   Glucose, Bld 111 (*)    BUN 62 (*)    Creatinine, Ser 3.03 (*)    Calcium 8.4 (*)    Albumin 3.0 (*)    GFR, Estimated 22 (*)    All other components within normal limits  CBG MONITORING, ED - Abnormal; Notable  for the following components:   Glucose-Capillary 129 (*)    All other components within normal limits  ETHANOL  RAPID URINE DRUG SCREEN, HOSP PERFORMED  URINALYSIS, ROUTINE W REFLEX MICROSCOPIC  I-STAT CHEM 8, ED    EKG None  Radiology CT ANGIO HEAD NECK W WO CM Result Date: 01/28/2023 CLINICAL DATA:  Confusion  and hemi-neglect EXAM: CT ANGIOGRAPHY HEAD AND NECK WITH AND WITHOUT CONTRAST TECHNIQUE: Multidetector CT imaging of the head and neck was performed using the standard protocol during bolus administration of intravenous contrast. Multiplanar CT image reconstructions and MIPs were obtained to evaluate the vascular anatomy. Carotid stenosis measurements (when applicable) are obtained utilizing NASCET criteria, using the distal internal carotid diameter as the denominator. RADIATION DOSE REDUCTION: This exam was performed according to the departmental dose-optimization program which includes automated exposure control, adjustment of the mA and/or kV according to patient size and/or use of iterative reconstruction technique. CONTRAST:  75mL OMNIPAQUE IOHEXOL 350 MG/ML SOLN COMPARISON:  None Available. FINDINGS: CTA NECK FINDINGS Skeleton: No acute abnormality or high grade bony spinal canal stenosis. Other neck: Normal pharynx, larynx and major salivary glands. No cervical lymphadenopathy. Unremarkable thyroid gland. Upper chest: There are multiple enlarged upper right mediastinal lymph nodes that measure up to 19 mm. Aortic arch: There is calcific atherosclerosis of the aortic arch. Conventional 3 vessel aortic branching pattern. RIGHT carotid system: No dissection, occlusion or aneurysm. Mild atherosclerotic calcification at the carotid bifurcation without hemodynamically significant stenosis. LEFT carotid system: No dissection, occlusion or aneurysm. There is mixed density atherosclerosis extending into the proximal ICA, resulting in 50% stenosis. Vertebral arteries: Codominant configuration.  There is no dissection, occlusion or flow-limiting stenosis to the skull base (V1-V3 segments). CTA HEAD FINDINGS POSTERIOR CIRCULATION: Vertebral arteries are normal. No proximal occlusion of the anterior or inferior cerebellar arteries. Basilar artery is normal. Superior cerebellar arteries are normal. Posterior cerebral arteries are normal. ANTERIOR CIRCULATION: There is bilateral atherosclerotic calcification of the internal carotid arteries at the skull base with severe stenosis of the distal right cavernous segment and the right clinoid segment. Anterior cerebral arteries are normal. Middle cerebral arteries are normal. Venous sinuses: As permitted by contrast timing, patent. Anatomic variants: None Review of the MIP images confirms the above findings. IMPRESSION: 1. No emergent large vessel occlusion. 2. Severe stenosis of the distal cavernous segment and the right clinoid segment of the right internal carotid artery. 3. A 50% stenosis of the proximal left internal carotid artery. 4. Multiple enlarged upper right mediastinal lymph nodes that measure up to 19 mm. Aortic Atherosclerosis (ICD10-I70.0). Electronically Signed   By: Deatra Robinson M.D.   On: 01/28/2023 02:47   CT HEAD CODE STROKE WO CONTRAST Result Date: 01/28/2023 CLINICAL DATA:  Code stroke.  Confusion and lethargy EXAM: CT HEAD WITHOUT CONTRAST TECHNIQUE: Contiguous axial images were obtained from the base of the skull through the vertex without intravenous contrast. RADIATION DOSE REDUCTION: This exam was performed according to the departmental dose-optimization program which includes automated exposure control, adjustment of the mA and/or kV according to patient size and/or use of iterative reconstruction technique. COMPARISON:  01/28/2023 FINDINGS: Brain: There is no acute hemorrhage. There are multiple old infarcts of the right temporal and occipital lobes. There is hypoattenuation of the periventricular white matter, most commonly  indicating chronic ischemic microangiopathy. Right middle crania meningioma is unchanged. Vascular: Atherosclerotic calcification of the internal carotid arteries at the skull base. No abnormal hyperdensity of the major intracranial arteries or dural venous sinuses. Skull: The visualized skull base, calvarium and extracranial soft tissues are normal. Sinuses/Orbits: No fluid levels or advanced mucosal thickening of the visualized paranasal sinuses. No mastoid or middle ear effusion. The orbits are normal. ASPECTS [facility] Stroke Program Early CT Score) - Ganglionic level infarction (caudate, lentiform nuclei, internal capsule, insula, M1-M3 cortex): 7 - Supraganglionic infarction (M4-M6 cortex): 3  Total score (0-10 with 10 being normal): 10 IMPRESSION: 1. No acute intracranial hemorrhage. 2. ASPECTS is 10. 3. 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 These results were called by telephone at the time of interpretation on 01/28/2023 at 2:32 am to provider [facility] , who verbally acknowledged these results. Electronically Signed   By: Deatra Robinson M.D.   On: 01/28/2023 02:33    Procedures Procedures    Medications Ordered in ED Medications  aspirin chewable tablet 324 mg (324 mg Oral Not Given 01/28/23 0328)  iohexol (OMNIPAQUE) 350 MG/ML injection 75 mL (75 mLs Intravenous Contrast Given 01/28/23 9147)    ED Course/ Medical Decision Making/ A&P Clinical Course as of 01/28/23 0408  Sat Jan 28, 2023  0159 Patient evaluated at 0155 for AMS.  VAN+ with left sided neglect and weakness.  Per nursing report from facility, patient last seen normal before 7 PM. [CH]  0204 Attempted to call Operating Room Services with no answer x 3.  VM left. [CH]  0301 Per teleneurology, normal stroke workup.  No indication for intervention or transfer at this time. [CH]    Clinical Course User Index [CH] Paden Kuras, Mayer Masker, MD                                 Medical Decision Making Amount and/or  Complexity of Data Reviewed Labs: ordered. Radiology: ordered.  Risk OTC drugs. Prescription drug management. Decision regarding hospitalization.   This patient presents to the ED for concern of altered mental status, this involves an extensive number of treatment options, and is a complaint that carries with it a high risk of complications and morbidity.  I considered the following differential and admission for this acute, potentially life threatening condition.  The differential diagnosis includes metabolic encephalopathy, sepsis, stroke  MDM:    This is a 68 year old male who presents with turns for altered mental status.  Initially he was only presented as altered mental status.  However on my initial exam there was some concern for some left-sided neglect.  Exam is very limited because of his inability to consistently follow commands.  Stroke workup was initiated and teleneurology assessment was done.  CT and CTA of the head do not reveal an LVO.  Neurology recommending full stroke workup otherwise.  He has a slightly worsened creatinine and a slightly elevated BUN which could be contributing to his mental status.  I have requested nurse to cath him for urinalysis.  He is not obviously septic or febrile.  (Labs, imaging, consults)  Labs: I Ordered, and personally interpreted labs.  The pertinent results include: Stroke workup  Imaging Studies ordered: I ordered imaging studies including CT CTA head neck I independently visualized and interpreted imaging. I agree with the radiologist interpretation  Additional history obtained from chart review.  External records from outside source obtained and reviewed including prior admissions and evaluations  Cardiac Monitoring: The patient was maintained on a cardiac monitor.  If on the cardiac monitor, I personally viewed and interpreted the cardiac monitored which showed an underlying rhythm of: Sinus rhythm  Reevaluation: After the  interventions noted above, I reevaluated the patient and found that they have :stayed the same  Social Determinants of Health:  lives at SNF  Disposition: Admit  Co morbidities that complicate the patient evaluation  Past Medical History:  Diagnosis Date   Bulging lumbar disc    CAD (coronary  artery disease) [phone]   a. prior LAD stenting. b. s/p DES to [facility] 08/2015. c. 04/2016 Cardiac cath at [facility]. Patent stent in the PLAD and RCA. Diffuse dLAD, OM2, and  RPDA disease. d. DES to PDA and distal RCA 03/2019    Chronic lower back pain    CKD (chronic kidney disease), stage II    Cocaine abuse (HCC)    Cyst of epididymis    DM2 (diabetes mellitus, type 2) (HCC)    Essential hypertension    Fournier gangrene    GERD (gastroesophageal reflux disease)    Headache    History of pneumonia    Hyperlipidemia    Ischemic cardiomyopathy    LV (left ventricular) mural thrombus    Sleep apnea      Medicines Meds ordered this encounter  Medications   iohexol (OMNIPAQUE) 350 MG/ML injection 75 mL   aspirin chewable tablet 324 mg    I have reviewed the patients home medicines and have made adjustments as needed  Problem List / ED Course: Problem List Items Addressed This Visit   None Visit Diagnoses       Altered mental status, unspecified altered mental status type    -  Primary     AKI (acute kidney injury) (HCC)                       Final Clinical Impression(s) / ED Diagnoses Final diagnoses:  Altered mental status, unspecified altered mental status type  AKI (acute kidney injury) [facility])    Rx / DC Orders ED Discharge Orders     None         Lorie Melichar, Mayer Masker, MD 01/28/23 [phone]

## 2023-01-28 NOTE — Progress Notes (Signed)
Dr.Johnson notified of pt's blood sugar of 49. Dextrose given and IV fluid order changed.  (See flowsheet for specifics)  Rechecked blood sugar:  121   Dr.Johnson notifed.

## 2023-01-28 NOTE — Hospital Course (Signed)
68 year old male with chronic combined systolic and diastolic heart failure, hyperlipidemia, type 2 diabetes mellitus, diabetic polyneuropathy, hypertension, polysubstance abuse, stage III CKD history of cocaine abuse, failure to thrive, coronary artery disease, ischemic cardiomyopathy, history of LV mural thrombus, OSA, history of Fournier gangrene, GERD, who was recently hospitalized for acute heart failure and discharged to [facility].  He was sent to the ED by EMS for lethargic and confusion since about 7 PM that evening.  He reportedly has normally been alert and oriented.  He was not answering questions.  Stroke was suspected and he was seen by teleneurologist.  TNK was not given as he was outside the window.  Neurology recommended MRI brain and routine stroke workup.  They recommended aspirin 325 mg daily.

## 2023-01-28 NOTE — ED Triage Notes (Signed)
[facility] from [facility].  Cc of patient being confused and lethargic.  Per staff patient has been like that since 7pm. Normally patient is alert and oriented.  Wont answer any questions.  159cbg with ems. Not normally on oxygen but today he is.  Hx of cocaine abuse

## 2023-01-29 DIAGNOSIS — I5022 Chronic systolic (congestive) heart failure: Secondary | ICD-10-CM | POA: Diagnosis not present

## 2023-01-29 DIAGNOSIS — I639 Cerebral infarction, unspecified: Secondary | ICD-10-CM | POA: Diagnosis not present

## 2023-01-29 LAB — CBC WITH DIFFERENTIAL/PLATELET
Abs Immature Granulocytes: 0.4 10*3/uL — ABNORMAL HIGH (ref 0.00–0.07)
Basophils Absolute: 0 10*3/uL (ref 0.0–0.1)
Basophils Relative: 0 %
Eosinophils Absolute: 0 10*3/uL (ref 0.0–0.5)
Eosinophils Relative: 0 %
HCT: 30.2 % — ABNORMAL LOW (ref 39.0–52.0)
Hemoglobin: 8 g/dL — ABNORMAL LOW (ref 13.0–17.0)
Immature Granulocytes: 2 %
Lymphocytes Relative: 2 %
Lymphs Abs: 0.6 10*3/uL — ABNORMAL LOW (ref 0.7–4.0)
MCH: 21 pg — ABNORMAL LOW (ref 26.0–34.0)
MCHC: 26.5 g/dL — ABNORMAL LOW (ref 30.0–36.0)
MCV: 79.3 fL — ABNORMAL LOW (ref 80.0–100.0)
Monocytes Absolute: 1.3 10*3/uL — ABNORMAL HIGH (ref 0.1–1.0)
Monocytes Relative: 5 %
Neutro Abs: 21.8 10*3/uL — ABNORMAL HIGH (ref 1.7–7.7)
Neutrophils Relative %: 91 %
Platelets: 359 10*3/uL (ref 150–400)
RBC: 3.81 MIL/uL — ABNORMAL LOW (ref 4.22–5.81)
RDW: 23.4 % — ABNORMAL HIGH (ref 11.5–15.5)
WBC: 24.1 10*3/uL — ABNORMAL HIGH (ref 4.0–10.5)
nRBC: 0.4 % — ABNORMAL HIGH (ref 0.0–0.2)

## 2023-01-29 LAB — GLUCOSE, CAPILLARY
Glucose-Capillary: 122 mg/dL — ABNORMAL HIGH (ref 70–99)
Glucose-Capillary: 114 mg/dL — ABNORMAL HIGH (ref 70–99)
Glucose-Capillary: 124 mg/dL — ABNORMAL HIGH (ref 70–99)
Glucose-Capillary: 133 mg/dL — ABNORMAL HIGH (ref 70–99)
Glucose-Capillary: 135 mg/dL — ABNORMAL HIGH (ref 70–99)

## 2023-01-29 LAB — BASIC METABOLIC PANEL
Anion gap: 10 (ref 5–15)
BUN: 67 mg/dL — ABNORMAL HIGH (ref 8–23)
CO2: 23 mmol/L (ref 22–32)
Calcium: 8.3 mg/dL — ABNORMAL LOW (ref 8.9–10.3)
Chloride: 108 mmol/L (ref 98–111)
Creatinine, Ser: 2.97 mg/dL — ABNORMAL HIGH (ref 0.61–1.24)
GFR, Estimated: 22 mL/min — ABNORMAL LOW (ref >60)
Glucose, Bld: 117 mg/dL — ABNORMAL HIGH (ref 70–99)
Potassium: 4 mmol/L (ref 3.5–5.1)
Sodium: 141 mmol/L (ref 135–145)

## 2023-01-29 LAB — LIPID PANEL
Cholesterol: 50 mg/dL (ref 0–200)
HDL: 14 mg/dL — ABNORMAL LOW (ref >40)
LDL Cholesterol: 29 mg/dL (ref 0–99)
Total CHOL/HDL Ratio: 3.6 {ratio}
Triglycerides: 37 mg/dL (ref <150)
VLDL: 7 mg/dL (ref 0–40)

## 2023-01-29 LAB — MAGNESIUM: Magnesium: 2.4 mg/dL (ref 1.7–2.4)

## 2023-01-29 MED ORDER — IPRATROPIUM-ALBUTEROL 0.5-2.5 (3) MG/3ML IN SOLN
3.0000 mL | Freq: Four times a day (QID) | RESPIRATORY_TRACT | Status: DC
  Administered 2023-01-29 – 2023-01-30 (×5): 3 mL via RESPIRATORY_TRACT
  Filled 2023-01-29 (×5): qty 3

## 2023-01-29 MED ORDER — ATORVASTATIN CALCIUM 40 MG PO TABS
40.0000 mg | ORAL_TABLET | Freq: Every day | ORAL | Status: DC
  Administered 2023-02-02 – 2023-02-03 (×2): 40 mg via ORAL
  Filled 2023-01-29 (×4): qty 1

## 2023-01-29 MED ORDER — FENTANYL CITRATE PF 50 MCG/ML IJ SOSY
12.5000 ug | PREFILLED_SYRINGE | INTRAMUSCULAR | Status: DC | PRN
  Administered 2023-01-29: 12.5 ug via INTRAVENOUS
  Filled 2023-01-29: qty 1

## 2023-01-29 MED ORDER — GABAPENTIN 100 MG PO CAPS
100.0000 mg | ORAL_CAPSULE | Freq: Every day | ORAL | Status: DC
  Administered 2023-02-02 – 2023-02-03 (×2): 100 mg via ORAL
  Filled 2023-01-29 (×4): qty 1

## 2023-01-29 MED ORDER — HYDROMORPHONE HCL 1 MG/ML IJ SOLN
0.5000 mg | INTRAMUSCULAR | Status: DC | PRN
  Administered 2023-01-29 – 2023-02-14 (×41): 0.5 mg via INTRAVENOUS
  Filled 2023-01-29 (×42): qty 0.5

## 2023-01-29 MED ORDER — LORAZEPAM 2 MG/ML IJ SOLN
0.5000 mg | Freq: Four times a day (QID) | INTRAMUSCULAR | Status: DC | PRN
  Administered 2023-01-29 – 2023-02-12 (×20): 0.5 mg via INTRAVENOUS
  Filled 2023-01-29 (×20): qty 1

## 2023-01-29 MED ORDER — LINACLOTIDE 145 MCG PO CAPS
145.0000 ug | ORAL_CAPSULE | Freq: Every day | ORAL | Status: DC
  Administered 2023-02-03: 145 ug via ORAL
  Filled 2023-01-29 (×3): qty 1

## 2023-01-29 MED ORDER — DEXTROSE-SODIUM CHLORIDE 5-0.9 % IV SOLN
INTRAVENOUS | Status: AC
Start: 2023-01-29 — End: 2023-01-30

## 2023-01-29 MED ORDER — FERROUS SULFATE 325 (65 FE) MG PO TABS
325.0000 mg | ORAL_TABLET | Freq: Every day | ORAL | Status: DC
  Administered 2023-02-03: 325 mg via ORAL
  Filled 2023-01-29 (×3): qty 1

## 2023-01-29 MED ORDER — FOLIC ACID 1 MG PO TABS
1.0000 mg | ORAL_TABLET | Freq: Every day | ORAL | Status: DC
  Administered 2023-02-03: 1 mg via ORAL
  Filled 2023-01-29 (×4): qty 1

## 2023-01-29 NOTE — Evaluation (Signed)
Physical Therapy Evaluation Patient Details Name: [name] MRN: [id number] DOB: 22-Dec-1955 Today's Date: 01/29/2023  History of Present Illness  [name] is a 68 year old male with chronic combined systolic and diastolic heart failure, hyperlipidemia, type 2 diabetes mellitus, diabetic polyneuropathy, hypertension, polysubstance abuse, stage III CKD history of cocaine abuse, failure to thrive, coronary artery disease, ischemic cardiomyopathy, history of LV mural thrombus, OSA, history of Fournier gangrene, GERD, who was recently hospitalized for acute heart failure and discharged to [facility].     He was sent to the ED by EMS for lethargic and confusion since about 7 PM that evening.  He reportedly has normally been alert and oriented.  He was not answering questions.  Stroke was suspected and he was seen by teleneurologist.  TNK was not given as he was outside the window.  Neurology recommended MRI brain and routine stroke workup.  They recommended aspirin 325 mg daily.   Clinical Impression  Patient demonstrates slow labored movement for sitting up at bedside with limited use of BUE due to weakness and jerking/spastic like movement, unable to stand after repeated attempts due to BLE weakness and required Max assist to reposition when put back to bed.  Patient will benefit from continued skilled physical therapy in hospital and recommended venue below to increase strength, balance, endurance for safe ADLs and gait.          If plan is discharge home, recommend the following: A lot of help with bathing/dressing/bathroom;A lot of help with walking and/or transfers;Help with stairs or ramp for entrance;Assistance with cooking/housework   Can travel by private vehicle   No    Equipment Recommendations None recommended by PT  Recommendations for Other Services       Functional Status Assessment Patient has had a recent decline in their functional status and demonstrates the  ability to make significant improvements in function in a reasonable and predictable amount of time.     Precautions / Restrictions Precautions Precautions: Fall Restrictions Weight Bearing Restrictions Per Provider Order: No      Mobility  Bed Mobility Overal bed mobility: Needs Assistance Bed Mobility: Supine to Sit     Supine to sit: Max assist, Mod assist Sit to supine: Mod assist, Max assist   General bed mobility comments: slow labored movement    Transfers                        Ambulation/Gait                  Stairs            Wheelchair Mobility     Tilt Bed    Modified Rankin (Stroke Patients Only)       Balance Overall balance assessment: Needs assistance Sitting-balance support: Feet supported, No upper extremity supported Sitting balance-Leahy Scale: Poor Sitting balance - Comments: fair/poor seated at EOB                                     Pertinent Vitals/Pain Pain Assessment Pain Assessment: Faces Faces Pain Scale: Hurts whole lot Pain Location: Right foot, ankle, lower leg with movement, pressure Pain Descriptors / Indicators: Grimacing, Guarding, Sharp, Discomfort, Moaning Pain Intervention(s): Limited activity within patient's tolerance, Monitored during session, Repositioned    Home Living Family/patient expects to be discharged to:: Private residence Living Arrangements: Non-relatives/Friends Available Help at  Discharge: Family;Friend(s);Available PRN/intermittently;Personal care attendant Type of Home: Mobile home Home Access: Stairs to enter Entrance Stairs-Rails: Right;Left;Can reach both Entrance Stairs-Number of Steps: 2   Home Layout: One level Home Equipment: Rollator (4 wheels);Shower seat;Toilet riser      Prior Function Prior Level of Function : Independent/Modified Independent             Mobility Comments: Household ambulation using Rollator ADLs Comments: Doesn't drive  and says he has help with shopping. independent with ADLs, reports aide coming in 7x/week with cooking and cleaning     Extremity/Trunk Assessment   Upper Extremity Assessment Upper Extremity Assessment: Defer to OT evaluation    Lower Extremity Assessment Lower Extremity Assessment: Generalized weakness    Cervical / Trunk Assessment Cervical / Trunk Assessment: Normal  Communication   Communication Communication: Difficulty following commands/understanding Cueing Techniques: Verbal cues;Tactile cues  Cognition Arousal: Alert Behavior During Therapy: Flat affect Overall Cognitive Status: Impaired/Different from baseline Area of Impairment: Attention                   Current Attention Level: Alternating           General Comments: Requires repeated verbal/tactile cueing for following directions        General Comments      Exercises     Assessment/Plan    PT Assessment Patient needs continued PT services  PT Problem List Decreased strength;Decreased activity tolerance;Decreased balance;Decreased mobility       PT Treatment Interventions DME instruction;Gait training;Stair training;Functional mobility training;Therapeutic activities;Therapeutic exercise;Balance training;Patient/family education    PT Goals (Current goals can be found in the Care Plan section)  Acute Rehab PT Goals Patient Stated Goal: return home PT Goal Formulation: With patient Time For Goal Achievement: 02/12/23 Potential to Achieve Goals: Good    Frequency Min 3X/week     Co-evaluation               AM-PAC PT "6 Clicks" Mobility  Outcome Measure Help needed turning from your back to your side while in a flat bed without using bedrails?: A Lot Help needed moving from lying on your back to sitting on the side of a flat bed without using bedrails?: A Lot Help needed moving to and from a bed to a chair (including a wheelchair)?: Total Help needed standing up from a  chair using your arms (e.g., wheelchair or bedside chair)?: Total Help needed to walk in hospital room?: Total Help needed climbing 3-5 steps with a railing? : Total 6 Click Score: 8    End of Session   Activity Tolerance: Patient tolerated treatment well;Patient limited by fatigue;Patient limited by lethargy Patient left: in bed;with call bell/phone within reach Nurse Communication: Mobility status PT Visit Diagnosis: Unsteadiness on feet (R26.81);Other abnormalities of gait and mobility (R26.89);Muscle weakness (generalized) (M62.81)    Time: 1191-4782 PT Time Calculation (min) (ACUTE ONLY): 29 min   Charges:   PT Evaluation $PT Eval Moderate Complexity: 1 Mod PT Treatments $Therapeutic Activity: 23-37 mins PT General Charges $$ ACUTE PT VISIT: 1 Visit         12:40 PM, 01/29/23 [name], MPT Physical Therapist with [facility] [phone] office [phone] mobile phone

## 2023-01-29 NOTE — Plan of Care (Signed)
  Problem: Acute Rehab PT Goals(only PT should resolve) Goal: Pt Will Go Supine/Side To Sit Outcome: Progressing Flowsheets (Taken 01/29/2023 1241) Pt will go Supine/Side to Sit:  with moderate assist  with minimal assist Goal: Patient Will Transfer Sit To/From Stand Outcome: Progressing Flowsheets (Taken 01/29/2023 1241) Patient will transfer sit to/from stand: with moderate assist Goal: Pt Will Transfer Bed To Chair/Chair To Bed Outcome: Progressing Flowsheets (Taken 01/29/2023 1241) Pt will Transfer Bed to Chair/Chair to Bed: with mod assist Goal: Pt Will Ambulate Outcome: Progressing Flowsheets (Taken 01/29/2023 1241) Pt will Ambulate:  with moderate assist  with rolling walker  15 feet   12:41 PM, 01/29/23 [name], MPT Physical Therapist with [facility] [phone] office [phone] mobile phone

## 2023-01-29 NOTE — Progress Notes (Signed)
PROGRESS NOTE   [name]  TDD:[id number] DOB: 27-Jan-1955 DOA: 01/28/2023 PCP: Benetta Spar, MD   Chief Complaint  Patient presents with   Altered Mental Status   Level of care: Stepdown  Brief Admission History:  68 year old male with chronic combined systolic and diastolic heart failure, hyperlipidemia, type 2 diabetes mellitus, diabetic polyneuropathy, hypertension, polysubstance abuse, stage III CKD history of cocaine abuse, failure to thrive, coronary artery disease, ischemic cardiomyopathy, history of LV mural thrombus, OSA, history of Fournier gangrene, GERD, who was recently hospitalized for acute heart failure and discharged to [facility].  He was sent to the ED by EMS for lethargic and confusion since about 7 PM that evening.  He reportedly has normally been alert and oriented.  He was not answering questions.  Stroke was suspected and he was seen by teleneurologist.  TNK was not given as he was outside the window.  Neurology recommended MRI brain and routine stroke workup.  They recommended aspirin 325 mg daily.   Assessment and Plan:  Acute ischemic CVA  - MRI brain positive for small acute infarct in the left insular white matter - ASA 325 mg daily ordered - encephalopathy worsened this morning and was transferred to stepdown ICU for bipap - reached out to neurology on call for further recommendations - neuro checks ordered - check A1c, lipids - pt had recent TTE last month - IV fluids ordered - permissive hypertension  - NPO until he has been evaluated by SLP - discussed with neurology, get EEG on Monday   Acute respiratory failure with hypoxia Aspiration pneumonia - IV ampicillin sulbactam ordered after blood cultures - BIPAP therapy ordered and transferred to stepdown ICU on 1/19 - scheduled bronchodilators Duonebs Q4h   Acute respiratory acidosis - management as above with bipap therapy  - mentation improved with bipap therapy    AKI on  CKD stage 3b - creatinine markedly elevated today - he is likely dry from diuretics and poor oral intake - IV fluid hydration ordered (gentle given cardiomyopathy) - renally dose medications - follow BMP daily    Type 2 DM uncontrolled with vascular complications - A1c 7.8% - SSI coverage for now with frequent CBG testing  CBG (last 3)  Recent Labs    01/28/23 2351 01/29/23 0350 01/29/23 0734  GLUCAP 106* 122* 114*    History of polysubstance abuse - he is currently residing in a SNF - UDS negative    Leukocytosis  - suspect secondary to aspiration pneumonia - treating as above - follow CBC with diff    Normocytic Anemia - follow CBC closely  - transfuse for Hg<7 - continue daily iron supplement     Hyperlipidemia - check fasting lipid panel - resume home atorvastatin daily 40 mg   Essential Hypertension  - allowing permissive hypertension in setting of acute CVA per neuro recs   Ischemic cardiomyopathy  - recent TTE 12/24 LVEF 30-35%  - holding home torsemide 40 mg daily   DVT prophylaxis: sq heparin  Code Status: Full  Family Communication: daughter 1/18, 1/19 Disposition: anticipate SNF rehab    Consultants:  Neurology   Procedures:    Antimicrobials:    Subjective: Pt still slurring speech and drooling at times. He is off bipap this morning.   Objective: Vitals:   01/29/23 0800 01/29/23 1000 01/29/23 1100 01/29/23 1135  BP: (!) 100/55 105/60 115/65   Pulse: 89  80   Resp: 16 14 15    Temp: 98.4 F (36.9  C)   98.4 F (36.9 C)  TempSrc: Oral   Oral  SpO2: 100%  100%   Weight:      Height:        Intake/Output Summary (Last 24 hours) at 01/29/2023 1142 Last data filed at 01/28/2023 2303 Gross per 24 hour  Intake 284.83 ml  Output --  Net 284.83 ml   Filed Weights   01/28/23 0137 01/29/23 0357  Weight: 95.3 kg 99.1 kg   Examination:  General exam: Appears calm and comfortable off bipap on [place].  Slurring speech and drooling seen.   Respiratory system: rare rales heard on left side, no increased WOB. Cardiovascular system: normal S1 & S2 heard. No JVD, murmurs, rubs, gallops or clicks. No pedal edema. Gastrointestinal system: Abdomen is nondistended, soft and nontender. No organomegaly or masses felt. Normal bowel sounds heard. Central nervous system: Alert. Left hemiparesis. Extremities: Symmetric 5 x 5 power. Skin: No rashes, lesions or ulcers. Psychiatry: Judgement and insight appear UTD. Mood & affect.    Data Reviewed: I have personally reviewed following labs and imaging studies  CBC: Recent Labs  Lab 01/22/23 2233 01/28/23 0214 01/29/23 0451  WBC 8.4 15.7* 24.1*  NEUTROABS 6.0 13.2* 21.8*  HGB 7.0* 8.2* 8.0*  HCT 25.8* 31.3* 30.2*  MCV 79.1* 80.7 79.3*  PLT 182 321 359    Basic Metabolic Panel: Recent Labs  Lab 01/22/23 2233 01/28/23 0214 01/29/23 0451  NA 132* 137 141  K 3.8 4.2 4.0  CL 100 103 108  CO2 25 24 23   GLUCOSE 148* 111* 117*  BUN 55* 62* 67*  CREATININE 2.18* 3.03* 2.97*  CALCIUM 7.7* 8.4* 8.3*  MG  --   --  2.4    CBG: Recent Labs  Lab 01/28/23 1622 01/28/23 1950 01/28/23 2351 01/29/23 0350 01/29/23 0734  GLUCAP 121* 108* 106* 122* 114*    Recent Results (from the past 240 hours)  Culture, blood (Routine X 2) w Reflex to ID Panel     Status: None (Preliminary result)   Collection Time: 01/28/23  9:39 AM   Specimen: BLOOD  Result Value Ref Range Status   Specimen Description BLOOD BLOOD RIGHT ARM  Final   Special Requests   Final    BOTTLES DRAWN AEROBIC AND ANAEROBIC Blood Culture adequate volume   Culture   Final    NO GROWTH < 24 HOURS Performed at [facility], [address], [place], [place] [address]    Report Status PENDING  Incomplete  Culture, blood (Routine X 2) w Reflex to ID Panel     Status: None (Preliminary result)   Collection Time: 01/28/23  9:46 AM   Specimen: BLOOD  Result Value Ref Range Status   Specimen Description BLOOD LEFT  ANTECUBITAL  Final   Special Requests   Final    BOTTLES DRAWN AEROBIC AND ANAEROBIC Blood Culture adequate volume   Culture   Final    NO GROWTH < 24 HOURS Performed at [facility], [address] [place], [place] [address]    Report Status PENDING  Incomplete  MRSA Next Gen by PCR, Nasal     Status: Abnormal   Collection Time: 01/28/23 10:42 AM   Specimen: Nasal Mucosa; Nasal Swab  Result Value Ref Range Status   MRSA by PCR Next Gen DETECTED (A) NOT DETECTED Final    Comment: RESULT CALLED TO, READ BACK BY AND VERIFIED WITH: IRVING,L ON 01/28/23 AT 2125 BY PURDIE,J        The  GeneXpert MRSA Assay (FDA approved for NASAL specimens only), is one component of a comprehensive MRSA colonization surveillance program. It is not intended to diagnose MRSA infection nor to guide or monitor treatment for MRSA infections. Performed at [facility], [address], [place], [place] [address]      Radiology Studies: MR BRAIN WO CONTRAST Result Date: 01/28/2023 CLINICAL DATA:  Altered mental status with acute stroke suspected. Left-sided weakness EXAM: MRI HEAD WITHOUT CONTRAST TECHNIQUE: Multiplanar, multiecho pulse sequences of the brain and surrounding structures were obtained without intravenous contrast. COMPARISON:  Head CT and CTA from earlier today FINDINGS: Brain: Small acute infarct in the left subinsular white matter. Extra-axial mass in the right middle cranial fossa with swelling of the adjacent temporal lobe, history of meningioma. Moderate chronic infarct in the right occipital lobe. Generalized brain atrophy with chronic small vessel ischemia in the deep cerebral white matter. Chronic microhemorrhage in the central pons. Vascular: Normal flow voids. Skull and upper cervical spine: No acute finding Sinuses/Orbits: Right mastoid opacification with negative nasopharynx, progressed from before. Left mastoid opacification has improved. IMPRESSION: 1. Small acute infarct in the left  insular white matter. 2. Known meningioma in the right middle cranial fossa with edema in the adjacent temporal lobe similar to 11/06/2022. 3. Chronic ischemic injury as described. Electronically Signed   By: Tiburcio Pea M.D.   On: 01/28/2023 10:20   DG CHEST PORT 1 VIEW Result Date: 01/28/2023 CLINICAL DATA:  68 year old male with history of leukocytosis. EXAM: PORTABLE CHEST 1 VIEW COMPARISON:  Chest x-ray 01/22/2023. FINDINGS: Lung volumes are low. Opacity in the right mid to lower lung which may reflect underlying atelectasis and/or consolidation. Small right pleural effusion. No left pleural effusion. No pneumothorax. Cephalization of the pulmonary vasculature, without frank pulmonary edema. Moderate cardiomegaly. The patient is rotated to the right on today's exam, resulting in distortion of the mediastinal contours and reduced diagnostic sensitivity and specificity for mediastinal pathology. IMPRESSION: 1. Low lung volumes with atelectasis and/or consolidation in the right mid to lower lung. 2. Small right pleural effusion. 3. Cardiomegaly with pulmonary venous congestion, but no frank pulmonary edema. Electronically Signed   By: Trudie Reed M.D.   On: 01/28/2023 07:56   CT ANGIO HEAD NECK W WO CM Result Date: 01/28/2023 CLINICAL DATA:  Confusion and hemi-neglect EXAM: CT ANGIOGRAPHY HEAD AND NECK WITH AND WITHOUT CONTRAST TECHNIQUE: Multidetector CT imaging of the head and neck was performed using the standard protocol during bolus administration of intravenous contrast. Multiplanar CT image reconstructions and MIPs were obtained to evaluate the vascular anatomy. Carotid stenosis measurements (when applicable) are obtained utilizing NASCET criteria, using the distal internal carotid diameter as the denominator. RADIATION DOSE REDUCTION: This exam was performed according to the departmental dose-optimization program which includes automated exposure control, adjustment of the mA and/or kV  according to patient size and/or use of iterative reconstruction technique. CONTRAST:  75mL OMNIPAQUE IOHEXOL 350 MG/ML SOLN COMPARISON:  None Available. FINDINGS: CTA NECK FINDINGS Skeleton: No acute abnormality or high grade bony spinal canal stenosis. Other neck: Normal pharynx, larynx and major salivary glands. No cervical lymphadenopathy. Unremarkable thyroid gland. Upper chest: There are multiple enlarged upper right mediastinal lymph nodes that measure up to 19 mm. Aortic arch: There is calcific atherosclerosis of the aortic arch. Conventional 3 vessel aortic branching pattern. RIGHT carotid system: No dissection, occlusion or aneurysm. Mild atherosclerotic calcification at the carotid bifurcation without hemodynamically significant stenosis. LEFT carotid system: No dissection, occlusion or aneurysm. There is  mixed density atherosclerosis extending into the proximal ICA, resulting in 50% stenosis. Vertebral arteries: Codominant configuration. There is no dissection, occlusion or flow-limiting stenosis to the skull base (V1-V3 segments). CTA HEAD FINDINGS POSTERIOR CIRCULATION: Vertebral arteries are normal. No proximal occlusion of the anterior or inferior cerebellar arteries. Basilar artery is normal. Superior cerebellar arteries are normal. Posterior cerebral arteries are normal. ANTERIOR CIRCULATION: There is bilateral atherosclerotic calcification of the internal carotid arteries at the skull base with severe stenosis of the distal right cavernous segment and the right clinoid segment. Anterior cerebral arteries are normal. Middle cerebral arteries are normal. Venous sinuses: As permitted by contrast timing, patent. Anatomic variants: None Review of the MIP images confirms the above findings. IMPRESSION: 1. No emergent large vessel occlusion. 2. Severe stenosis of the distal cavernous segment and the right clinoid segment of the right internal carotid artery. 3. A 50% stenosis of the proximal left  internal carotid artery. 4. Multiple enlarged upper right mediastinal lymph nodes that measure up to 19 mm. Aortic Atherosclerosis (ICD10-I70.0). Electronically Signed   By: Deatra Robinson M.D.   On: 01/28/2023 02:47   CT HEAD CODE STROKE WO CONTRAST Result Date: 01/28/2023 CLINICAL DATA:  Code stroke.  Confusion and lethargy EXAM: CT HEAD WITHOUT CONTRAST TECHNIQUE: Contiguous axial images were obtained from the base of the skull through the vertex without intravenous contrast. RADIATION DOSE REDUCTION: This exam was performed according to the departmental dose-optimization program which includes automated exposure control, adjustment of the mA and/or kV according to patient size and/or use of iterative reconstruction technique. COMPARISON:  01/28/2023 FINDINGS: Brain: There is no acute hemorrhage. There are multiple old infarcts of the right temporal and occipital lobes. There is hypoattenuation of the periventricular white matter, most commonly indicating chronic ischemic microangiopathy. Right middle crania meningioma is unchanged. Vascular: Atherosclerotic calcification of the internal carotid arteries at the skull base. No abnormal hyperdensity of the major intracranial arteries or dural venous sinuses. Skull: The visualized skull base, calvarium and extracranial soft tissues are normal. Sinuses/Orbits: No fluid levels or advanced mucosal thickening of the visualized paranasal sinuses. No mastoid or middle ear effusion. The orbits are normal. ASPECTS [facility] Stroke Program Early CT Score) - Ganglionic level infarction (caudate, lentiform nuclei, internal capsule, insula, M1-M3 cortex): 7 - Supraganglionic infarction (M4-M6 cortex): 3 Total score (0-10 with 10 being normal): 10 IMPRESSION: 1. No acute intracranial hemorrhage. 2. ASPECTS is 10. 3. 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 These results were called by telephone at the time of interpretation on 01/28/2023 at 2:32 am to  provider [facility] , who verbally acknowledged these results. Electronically Signed   By: Deatra Robinson M.D.   On: 01/28/2023 02:33    Scheduled Meds:   stroke: early stages of recovery book   Does not apply Once   aspirin  324 mg Oral Once   aspirin  300 mg Rectal Daily   Or   aspirin  325 mg Oral Daily   Chlorhexidine Gluconate Cloth  6 each Topical Daily   Chlorhexidine Gluconate Cloth  6 each Topical Q0600   dextrose  1 ampule Intravenous Once   heparin  5,000 Units Subcutaneous Q8H   insulin aspart  0-9 Units Subcutaneous Q4H   ipratropium-albuterol  3 mL Nebulization Q4H   mupirocin ointment  1 Application Nasal BID   Continuous Infusions:  ampicillin-sulbactam (UNASYN) IV 3 g (01/29/23 1129)   dextrose 5 % and 0.9 % NaCl 40 mL/hr at 01/29/23 0850  LOS: 1 day   Critical Care Procedure Note Authorized and Performed by: Maryln Manuel MD  Total Critical Care time:  58 mins Due to a high probability of clinically significant, life threatening deterioration, the patient required my highest level of preparedness to intervene emergently and I personally spent this critical care time directly and personally managing the patient.  This critical care time included obtaining a history; examining the patient, pulse oximetry; ordering and review of studies; arranging urgent treatment with development of a management plan; evaluation of patient's response of treatment; frequent reassessment; and discussions with other providers.  This critical care time was performed to assess and manage the high probability of imminent and life threatening deterioration that could result in multi-organ failure.  It was exclusive of separately billable procedures and treating other patients and teaching time.    Standley Dakins, MD How to contact the [facility] Attending or Consulting provider 7A - 7P or covering provider during after hours 7P -7A, for this patient?  Check the care team in [facility] and look for a)  attending/consulting [facility] provider listed and b) the [facility] team listed Log into [email or web address] to find provider on call.  Locate the [facility] provider you are looking for under [facility] and page to a number that you can be directly reached. If you still have difficulty reaching the provider, please page the [facility] (Director on Call) for the Hospitalists listed on amion for assistance.  01/29/2023, 11:42 AM

## 2023-01-29 NOTE — Plan of Care (Signed)
  Problem: Elimination: Goal: Will not experience complications related to urinary retention Outcome: Progressing   Problem: Pain Managment: Goal: General experience of comfort will improve and/or be controlled Outcome: Progressing   Problem: Skin Integrity: Goal: Risk for impaired skin integrity will decrease Outcome: Progressing

## 2023-01-29 NOTE — [place] FL2 (Signed)
[place] MEDICAID FL2 LEVEL OF CARE FORM     IDENTIFICATION  Patient Name: [name] Birthdate: 10-04-1955 Sex: male Admission Date (Current Location): 01/28/2023  [facility] and [place] Number:  [employer] and Address:  [facility],  [address], [place] [address]      Provider Number: [id number]  Attending Physician Name and Address:  Cleora Fleet, MD  Relative Name and Phone Number:  Shahan, Deptula (Daughter)  [phone]    Current Level of Care: Hospital Recommended Level of Care: Skilled Nursing Facility Prior Approval Number:    Date Approved/Denied:   PASRR Number: [id number]  Discharge Plan: SNF    Current Diagnoses: Patient Active Problem List   Diagnosis Date Noted   Acute metabolic encephalopathy 01/28/2023   Acute on chronic combined systolic (congestive) and diastolic (congestive) heart failure (HCC) 01/14/2023   Frequent falls 01/14/2023   Diabetic nephropathy (HCC) 01/13/2023   Hidradenitis 01/13/2023   Hyperlipidemia, mixed 01/13/2023   Obesity 01/13/2023   Opioid dependence (HCC) 01/13/2023   Old anterior cruciate ligament disruption 01/13/2023   Spermatocele 01/13/2023   Spinal stenosis of lumbar region 01/13/2023   Varicocele 01/13/2023   Left ventricular thrombus 01/13/2023   Stimulant abuse (HCC) 01/13/2023   Chronic low back pain 01/13/2023   Hydrocele in adult 01/13/2023   CHF (congestive heart failure) (HCC) 01/13/2023   Osteoarthritis of knees, bilateral 01/13/2023   Chronic kidney disease, stage 3b (HCC) 12/21/2022   Perianal abscess 12/19/2022   Adenomatous polyp of transverse colon 11/11/2022   H/O medication noncompliance 11/09/2022   Pleural effusion 11/10/2021   Mediastinal lymphadenopathy 11/09/2021   Stroke (cerebrum) (HCC) 11/07/2021   Protein calorie malnutrition (HCC) 11/07/2021   Scrotal pain; chronic, bilateral 07/05/2021   Microscopic hematuria 07/05/2021   Organic impotence  07/05/2021   Opiate overdose (HCC)    Uncontrolled type 2 diabetes mellitus with hyperglycemia (HCC) 05/16/2019   Cocaine abuse (HCC) 05/15/2019   Apical mural thrombus 10/15/2018   Claudication in peripheral vascular disease (HCC) 11/30/2017   PAD (peripheral artery disease) (HCC) 11/28/2017   Acute kidney injury superimposed on chronic kidney disease (HCC) 01/31/2016   Cocaine use 09/04/2015   Essential hypertension 06/29/2015   Status post coronary artery stent placement 06/29/2015   Insulin dependent diabetes mellitus 06/29/2015   History of MI (myocardial infarction) 06/29/2015   OSA on CPAP 01/21/2015   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 09/09/2013   Coronary artery disease 09/09/2013   Ischemic cardiomyopathy 09/09/2013   S/P LAD DES June 2014 09/06/2013   Tobacco use disorder 09/06/2013   Hypertensive heart disease     Orientation RESPIRATION BLADDER Height & Weight     Self, Situation, Time, Place  Normal Incontinent Weight: 218 lb 7.6 oz (99.1 kg) Height:  5\' 9"  (175.3 cm)  BEHAVIORAL SYMPTOMS/MOOD NEUROLOGICAL BOWEL NUTRITION STATUS      Incontinent Diet (See D/C summary)  AMBULATORY STATUS COMMUNICATION OF NEEDS Skin   Extensive Assist Verbally Normal                       Personal Care Assistance Level of Assistance  Bathing, Feeding, Dressing Bathing Assistance: Maximum assistance Feeding assistance: Limited assistance Dressing Assistance: Maximum assistance     Functional Limitations Info  Sight, Hearing, Speech Sight Info: Adequate Hearing Info: Adequate Speech Info: Adequate    SPECIAL CARE FACTORS FREQUENCY  PT (By licensed PT), OT (By licensed OT)     PT Frequency: 5  times weekly OT Frequency: 5 times weekly            Contractures Contractures Info: Not present    Additional Factors Info  Code Status, Allergies Code Status Info: FULL Allergies Info: Colpidogrel           Current Medications (01/29/2023):  This is the  current hospital active medication list Current Facility-Administered Medications  Medication Dose Route Frequency Provider Last Rate Last Admin    stroke: early stages of recovery book   Does not apply Once Johnson, Clanford L, MD       acetaminophen (TYLENOL) tablet 650 mg  650 mg Oral Q4H PRN Johnson, Clanford L, MD       Or   acetaminophen (TYLENOL) 160 MG/5ML solution 650 mg  650 mg Per Tube Q4H PRN Johnson, Clanford L, MD       Or   acetaminophen (TYLENOL) suppository 650 mg  650 mg Rectal Q4H PRN Johnson, Clanford L, MD       Ampicillin-Sulbactam (UNASYN) 3 g in sodium chloride 0.9 % 100 mL IVPB  3 g Intravenous Q12H Johnson, Clanford L, MD 200 mL/hr at 01/29/23 1129 3 g at 01/29/23 1129   aspirin chewable tablet 324 mg  324 mg Oral Once Johnson, Clanford L, MD       aspirin suppository 300 mg  300 mg Rectal Daily Johnson, Clanford L, MD   300 mg at 01/29/23 4098   Or   aspirin tablet 325 mg  325 mg Oral Daily Johnson, Clanford L, MD       Chlorhexidine Gluconate Cloth 2 % PADS 6 each  6 each Topical Daily Johnson, Clanford L, MD   6 each at 01/28/23 1100   Chlorhexidine Gluconate Cloth 2 % PADS 6 each  6 each Topical Q0600 Dolly Rias, MD   6 each at 01/29/23 0638   dextrose 5 %-0.9 % sodium chloride infusion   Intravenous Continuous Johnson, Clanford L, MD 40 mL/hr at 01/29/23 0850 New Bag at 01/29/23 0850   dextrose 50 % solution 50 mL  1 ampule Intravenous Once Johnson, Clanford L, MD       heparin injection 5,000 Units  5,000 Units Subcutaneous Q8H Johnson, Clanford L, MD   5,000 Units at 01/29/23 0520   insulin aspart (novoLOG) injection 0-9 Units  0-9 Units Subcutaneous Q4H Johnson, Clanford L, MD   1 Units at 01/29/23 0406   ipratropium-albuterol (DUONEB) 0.5-2.5 (3) MG/3ML nebulizer solution 3 mL  3 mL Nebulization Q4H Johnson, Clanford L, MD   3 mL at 01/29/23 1123   mupirocin ointment (BACTROBAN) 2 % 1 Application  1 Application Nasal BID Dolly Rias, MD   1  Application at 01/29/23 0849   senna-docusate (Senokot-S) tablet 1 tablet  1 tablet Oral QHS PRN Cleora Fleet, MD         Discharge Medications: Please see discharge summary for a list of discharge medications.  Relevant Imaging Results:  Relevant Lab Results:   Additional Information SSN: [id number] [place]

## 2023-01-29 NOTE — Progress Notes (Signed)
Patient removed from Bipap and placed on 3lpm [place]. He is alert and oriented to Name and DOB. He can follow commands. His SPO2 on 3L is 97%. He is in no distress. RT will monitor along with RN for necessity of BIpap.

## 2023-01-29 NOTE — Progress Notes (Signed)
Dr.Johnson notified pt.retaining over 566m and is expressing discomfort with inability to void. No orders thus far

## 2023-01-29 NOTE — Plan of Care (Signed)
  Problem: Education: Goal: Knowledge of General Education information will improve Description: Including pain rating scale, medication(s)/side effects and non-pharmacologic comfort measures Outcome: Not Met (add Reason) Note: Pt not alert

## 2023-01-29 NOTE — TOC Initial Note (Addendum)
Transition of Care [facility]) - Initial/Assessment Note    Patient Details  Name: [name] MRN: [id number] Date of Birth: 05/25/1955  Transition of Care [facility]) CM/SW Contact:    Villa Herb, LCSWA Phone Number: 01/29/2023, 11:32 AM  Clinical Narrative:                 Pt is high risk for readmission. Pt is well known to TOC. Pt arrived to hospital from [facility], pt is at facility for SNF rehab. PT has worked with pt and continues to recommend SNF at D/C. CSW spoke with pts daughter who states plan will likely be for return to [facility]. Pts daughter would like SNF referral sent out to other facilities as well. TOC to follow.   Expected Discharge Plan: Skilled Nursing Facility Barriers to Discharge: Continued Medical Work up   Patient Goals and CMS Choice Patient states their goals for this hospitalization and ongoing recovery are:: return to SNF CMS Medicare.gov Compare Post Acute Care list provided to:: Patient Choice offered to / list presented to : Patient      Expected Discharge Plan and Services In-house Referral: Clinical Social Work Discharge Planning Services: CM Consult Post Acute Care Choice: Skilled Nursing Facility Living arrangements for the past 2 months: Skilled Nursing Facility                                      Prior Living Arrangements/Services Living arrangements for the past 2 months: Skilled Nursing Facility Lives with:: Facility Resident Patient language and need for interpreter reviewed:: Yes Do you feel safe going back to the place where you live?: Yes      Need for Family Participation in Patient Care: Yes (Comment) Care giver support system in place?: Yes (comment)   Criminal Activity/Legal Involvement Pertinent to Current Situation/Hospitalization: No - Comment as needed  Activities of Daily Living      Permission Sought/Granted                  Emotional Assessment Appearance:: Appears stated age        Alcohol / Substance Use: Not Applicable Psych Involvement: No (comment)  Admission diagnosis:  AKI (acute kidney injury) (HCC) [N17.9] Altered mental status, unspecified altered mental status type [R41.82] Acute metabolic encephalopathy [G93.41] Patient Active Problem List   Diagnosis Date Noted   Acute metabolic encephalopathy 01/28/2023   Acute on chronic combined systolic (congestive) and diastolic (congestive) heart failure (HCC) 01/14/2023   Frequent falls 01/14/2023   Diabetic nephropathy (HCC) 01/13/2023   Hidradenitis 01/13/2023   Hyperlipidemia, mixed 01/13/2023   Obesity 01/13/2023   Opioid dependence (HCC) 01/13/2023   Old anterior cruciate ligament disruption 01/13/2023   Spermatocele 01/13/2023   Spinal stenosis of lumbar region 01/13/2023   Varicocele 01/13/2023   Left ventricular thrombus 01/13/2023   Stimulant abuse (HCC) 01/13/2023   Chronic low back pain 01/13/2023   Hydrocele in adult 01/13/2023   CHF (congestive heart failure) (HCC) 01/13/2023   Osteoarthritis of knees, bilateral 01/13/2023   Chronic kidney disease, stage 3b (HCC) 12/21/2022   Perianal abscess 12/19/2022   Adenomatous polyp of transverse colon 11/11/2022   H/O medication noncompliance 11/09/2022   Pleural effusion 11/10/2021   Mediastinal lymphadenopathy 11/09/2021   Stroke (cerebrum) (HCC) 11/07/2021   Protein calorie malnutrition (HCC) 11/07/2021   Scrotal pain; chronic, bilateral 07/05/2021   Microscopic hematuria 07/05/2021   Organic impotence  07/05/2021   Opiate overdose (HCC)    Uncontrolled type 2 diabetes mellitus with hyperglycemia (HCC) 05/16/2019   Cocaine abuse (HCC) 05/15/2019   Apical mural thrombus 10/15/2018   Claudication in peripheral vascular disease (HCC) 11/30/2017   PAD (peripheral artery disease) (HCC) 11/28/2017   Acute kidney injury superimposed on chronic kidney disease (HCC) 01/31/2016   Cocaine use 09/04/2015   Essential hypertension 06/29/2015   Status  post coronary artery stent placement 06/29/2015   Insulin dependent diabetes mellitus 06/29/2015   History of MI (myocardial infarction) 06/29/2015   OSA on CPAP 01/21/2015   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 09/09/2013   Coronary artery disease 09/09/2013   Ischemic cardiomyopathy 09/09/2013   S/P LAD DES June 2014 09/06/2013   Tobacco use disorder 09/06/2013   Hypertensive heart disease    PCP:  Benetta Spar, MD Pharmacy:   [facility] [place], [place] - [address] [place] [address] Phone: [phone] Fax: [phone]  [facility] [place], [place] - [address] [place] [address] Phone: [phone] Fax: [phone]  [facility] [place], [place] - [address] [place] [address] Phone: [phone] Fax: [phone]     Social Drivers of Health (SDOH) Social History: SDOH Screenings   Food Insecurity: Patient Unable To Answer (01/28/2023)  Housing: Patient Unable To Answer (01/28/2023)  Transportation Needs: Patient Unable To Answer (01/28/2023)  Utilities: Patient Unable To Answer (01/28/2023)  Alcohol Screen: Low Risk  (12/14/2022)  Depression (PHQ2-9): Low Risk  (12/14/2022)  Financial Resource Strain: Low Risk  (12/14/2022)  Physical Activity: Inactive (12/14/2022)  Social Connections: Patient Unable To Answer (01/28/2023)  Stress: Stress Concern Present (12/14/2022)  Tobacco Use: High Risk (01/28/2023)  Health Literacy: Adequate Health Literacy (12/14/2022)   SDOH Interventions:     Readmission Risk Interventions    01/29/2023   11:31 AM 12/19/2022   10:56 AM 11/04/2022   10:22 AM  Readmission Risk Prevention Plan  Transportation Screening Complete Complete Complete  HRI or Home Care Consult   Complete  Social Work Consult for Recovery Care Planning/Counseling   Complete  Palliative Care Screening   Not Applicable  Medication Review Furniture conservator/restorer) Complete Complete Complete  HRI or Home Care Consult Complete Complete   SW Recovery Care/Counseling Consult Complete Complete   Palliative Care Screening Not Applicable Not Applicable   Skilled Nursing Facility Complete Not Applicable

## 2023-01-30 ENCOUNTER — Inpatient Hospital Stay (HOSPITAL_COMMUNITY): Payer: No Typology Code available for payment source

## 2023-01-30 ENCOUNTER — Inpatient Hospital Stay (HOSPITAL_COMMUNITY)
Admit: 2023-01-30 | Discharge: 2023-01-30 | Disposition: A | Discharge status: Home / Self Care | Payer: No Typology Code available for payment source | Attending: Neurology | Admitting: Neurology

## 2023-01-30 ENCOUNTER — Ambulatory Visit (INDEPENDENT_AMBULATORY_CARE_PROVIDER_SITE_OTHER): Payer: 59 | Admitting: Gastroenterology

## 2023-01-30 DIAGNOSIS — E782 Mixed hyperlipidemia: Secondary | ICD-10-CM | POA: Diagnosis not present

## 2023-01-30 DIAGNOSIS — I639 Cerebral infarction, unspecified: Secondary | ICD-10-CM | POA: Diagnosis not present

## 2023-01-30 DIAGNOSIS — R569 Unspecified convulsions: Secondary | ICD-10-CM

## 2023-01-30 DIAGNOSIS — I255 Ischemic cardiomyopathy: Secondary | ICD-10-CM

## 2023-01-30 DIAGNOSIS — I252 Old myocardial infarction: Secondary | ICD-10-CM

## 2023-01-30 DIAGNOSIS — I110 Hypertensive heart disease with heart failure: Secondary | ICD-10-CM

## 2023-01-30 DIAGNOSIS — R4182 Altered mental status, unspecified: Secondary | ICD-10-CM

## 2023-01-30 DIAGNOSIS — Z955 Presence of coronary angioplasty implant and graft: Secondary | ICD-10-CM

## 2023-01-30 DIAGNOSIS — I739 Peripheral vascular disease, unspecified: Secondary | ICD-10-CM

## 2023-01-30 DIAGNOSIS — I6389 Other cerebral infarction: Secondary | ICD-10-CM

## 2023-01-30 LAB — CBC WITH DIFFERENTIAL/PLATELET
Abs Immature Granulocytes: 0.12 10*3/uL — ABNORMAL HIGH (ref 0.00–0.07)
Basophils Absolute: 0.1 10*3/uL (ref 0.0–0.1)
Basophils Relative: 0 %
Eosinophils Absolute: 0.4 10*3/uL (ref 0.0–0.5)
Eosinophils Relative: 2 %
HCT: 29.4 % — ABNORMAL LOW (ref 39.0–52.0)
Hemoglobin: 8 g/dL — ABNORMAL LOW (ref 13.0–17.0)
Immature Granulocytes: 1 %
Lymphocytes Relative: 4 %
Lymphs Abs: 0.7 10*3/uL (ref 0.7–4.0)
MCH: 21.1 pg — ABNORMAL LOW (ref 26.0–34.0)
MCHC: 27.2 g/dL — ABNORMAL LOW (ref 30.0–36.0)
MCV: 77.6 fL — ABNORMAL LOW (ref 80.0–100.0)
Monocytes Absolute: 1 10*3/uL (ref 0.1–1.0)
Monocytes Relative: 7 %
Neutro Abs: 13.5 10*3/uL — ABNORMAL HIGH (ref 1.7–7.7)
Neutrophils Relative %: 86 %
Platelets: 340 10*3/uL (ref 150–400)
RBC: 3.79 MIL/uL — ABNORMAL LOW (ref 4.22–5.81)
RDW: 23.4 % — ABNORMAL HIGH (ref 11.5–15.5)
WBC: 15.7 10*3/uL — ABNORMAL HIGH (ref 4.0–10.5)
nRBC: 0.2 % (ref 0.0–0.2)

## 2023-01-30 LAB — BASIC METABOLIC PANEL
Anion gap: 11 (ref 5–15)
BUN: 71 mg/dL — ABNORMAL HIGH (ref 8–23)
CO2: 23 mmol/L (ref 22–32)
Calcium: 8.6 mg/dL — ABNORMAL LOW (ref 8.9–10.3)
Chloride: 110 mmol/L (ref 98–111)
Creatinine, Ser: 2.8 mg/dL — ABNORMAL HIGH (ref 0.61–1.24)
GFR, Estimated: 24 mL/min — ABNORMAL LOW (ref >60)
Glucose, Bld: 147 mg/dL — ABNORMAL HIGH (ref 70–99)
Potassium: 3.9 mmol/L (ref 3.5–5.1)
Sodium: 144 mmol/L (ref 135–145)

## 2023-01-30 LAB — BLOOD GAS, ARTERIAL
Acid-Base Excess: 0.7 mmol/L (ref 0.0–2.0)
Bicarbonate: 25.4 mmol/L (ref 20.0–28.0)
Drawn by: 27016
O2 Saturation: 99.1 %
Patient temperature: 36.4
pCO2 arterial: 39 mm[Hg] (ref 32–48)
pH, Arterial: 7.42 (ref 7.35–7.45)
pO2, Arterial: 95 mm[Hg] (ref 83–108)

## 2023-01-30 LAB — GLUCOSE, CAPILLARY
Glucose-Capillary: 127 mg/dL — ABNORMAL HIGH (ref 70–99)
Glucose-Capillary: 150 mg/dL — ABNORMAL HIGH (ref 70–99)
Glucose-Capillary: 119 mg/dL — ABNORMAL HIGH (ref 70–99)
Glucose-Capillary: 82 mg/dL (ref 70–99)
Glucose-Capillary: 87 mg/dL (ref 70–99)
Glucose-Capillary: 102 mg/dL — ABNORMAL HIGH (ref 70–99)

## 2023-01-30 LAB — ECHOCARDIOGRAM COMPLETE BUBBLE STUDY
Area-P 1/2: 4.89 cm2
Calc EF: 23.9 %
S' Lateral: 4.7 cm
Single Plane A2C EF: 27.1 %
Single Plane A4C EF: 20.8 %

## 2023-01-30 LAB — PROCALCITONIN: Procalcitonin: 1.33 ng/mL

## 2023-01-30 MED ORDER — PERFLUTREN LIPID MICROSPHERE
1.0000 mL | INTRAVENOUS | Status: AC | PRN
  Administered 2023-01-30: 2 mL via INTRAVENOUS

## 2023-01-30 MED ORDER — IPRATROPIUM-ALBUTEROL 0.5-2.5 (3) MG/3ML IN SOLN
3.0000 mL | Freq: Two times a day (BID) | RESPIRATORY_TRACT | Status: DC
  Administered 2023-01-31 – 2023-02-01 (×3): 3 mL via RESPIRATORY_TRACT
  Filled 2023-01-30 (×4): qty 3

## 2023-01-30 MED ORDER — DEXTROSE-SODIUM CHLORIDE 5-0.9 % IV SOLN: INTRAVENOUS | Status: DC

## 2023-01-30 MED ORDER — ASPIRIN 300 MG RE SUPP
300.0000 mg | Freq: Every day | RECTAL | Status: DC
  Administered 2023-01-31 – 2023-02-01 (×2): 300 mg via RECTAL
  Filled 2023-01-30 (×4): qty 1

## 2023-01-30 MED ORDER — DEXAMETHASONE 4 MG PO TABS
4.0000 mg | ORAL_TABLET | Freq: Two times a day (BID) | ORAL | Status: DC
  Filled 2023-01-30 (×5): qty 1

## 2023-01-30 MED ORDER — TICAGRELOR 90 MG PO TABS: 90.0000 mg | ORAL_TABLET | Freq: Two times a day (BID) | ORAL | Status: DC

## 2023-01-30 MED ORDER — ASPIRIN 81 MG PO CHEW
81.0000 mg | CHEWABLE_TABLET | Freq: Every day | ORAL | Status: DC
  Administered 2023-02-03: 81 mg via ORAL
  Filled 2023-01-30 (×2): qty 1

## 2023-01-30 NOTE — Progress Notes (Signed)
PROGRESS NOTE   [name]  QIO:[id number] DOB: 1955/01/31 DOA: 01/28/2023 PCP: Benetta Spar, MD   Chief Complaint  Patient presents with   Altered Mental Status   Level of care: Stepdown  Brief Admission History:  68 year old male with chronic combined systolic and diastolic heart failure, hyperlipidemia, type 2 diabetes mellitus, diabetic polyneuropathy, hypertension, polysubstance abuse, stage III CKD history of cocaine abuse, failure to thrive, coronary artery disease, ischemic cardiomyopathy, history of LV mural thrombus, OSA, history of Fournier gangrene, GERD, who was recently hospitalized for acute heart failure and discharged to [facility].  He was sent to the ED by EMS for lethargic and confusion since about 7 PM that evening.  He reportedly has normally been alert and oriented.  He was not answering questions.  Stroke was suspected and he was seen by teleneurologist.  TNK was not given as he was outside the window.  Neurology recommended MRI brain and routine stroke workup.  They recommended aspirin 325 mg daily.   Assessment and Plan:  Acute ischemic CVA  - MRI brain positive for small acute infarct in the left insular white matter - ASA 325 mg daily ordered - encephalopathy worsened this morning and was transferred to stepdown ICU for bipap - reached out to neurology on call for further recommendations - neuro checks ordered - check A1c-7.2%, LDL - 29 on statin - TTE with bubble study ordered by neuro 1/20 pending - IV fluids ordered - NPO per SLP - discussed with neurology, EEG pending - call out to neurosurgery on call regarding meningioma  - agree with neurologist that palliative care consult needed - updated daughter by telephone    Acute respiratory failure with hypoxia Aspiration pneumonia - IV ampicillin sulbactam ordered after blood cultures - BIPAP therapy ordered and transferred to stepdown ICU on 1/19 - scheduled bronchodilators  Duonebs Q4h   Acute respiratory acidosis - management as above with bipap therapy  - mentation improved with bipap therapy - repeat ABG today reassuring    AKI on CKD stage 3b - creatinine remains elevated - he is likely dry from diuretics and poor oral intake - IV fluid hydration ordered (gentle given cardiomyopathy) - renally dose medications - follow BMP daily    Type 2 DM uncontrolled with vascular complications - A1c 7.8% - SSI coverage for now with frequent CBG testing  CBG (last 3)  Recent Labs    01/30/23 0818 01/30/23 1206 01/30/23 1623  GLUCAP 119* 82 87   History of polysubstance abuse - he is currently residing in a SNF - UDS negative    Leukocytosis  - suspect secondary to aspiration pneumonia - treating as above - follow CBC with diff    Normocytic Anemia - follow CBC closely  - transfuse for Hg<7 - continue daily iron supplement     Hyperlipidemia - check fasting lipid panel - resume home atorvastatin daily 40 mg   Essential Hypertension  - allowing permissive hypertension in setting of acute CVA per neuro recs   Ischemic cardiomyopathy  - recent TTE 12/24 LVEF 30-35%  - holding home torsemide 40 mg daily   DVT prophylaxis: sq heparin  Code Status: Full  Family Communication: daughter 1/18, 1/19 Disposition: anticipate SNF rehab    Consultants:  Neurology   Procedures:    Antimicrobials:    Subjective: Pt remains encephalopathic, slurred speech; drooling. Screaming out at times.   Objective: Vitals:   01/30/23 1300 01/30/23 1400 01/30/23 1500 01/30/23 1604  BP: (!) 92/53 Marland Kitchen)  80/46 (!) 86/50   Pulse: 80 85 90   Resp: 14 11 (!) 22   Temp:    (!) 97.4 F (36.3 C)  TempSrc:    Axillary  SpO2: 97% 97% 99%   Weight:      Height:        Intake/Output Summary (Last 24 hours) at 01/30/2023 1636 Last data filed at 01/30/2023 0844 Gross per 24 hour  Intake 776.01 ml  Output 700 ml  Net 76.01 ml   Filed Weights   01/28/23 0137  01/29/23 0357 01/30/23 0416  Weight: 95.3 kg 99.1 kg 97.3 kg   Examination:  General exam: Appears calm and comfortable on [place].  Slurring speech and drooling unchanged.  Respiratory system: rare rales heard on left side, no increased WOB. Cardiovascular system: normal S1 & S2 heard. No JVD, murmurs, rubs, gallops or clicks. No pedal edema. Gastrointestinal system: Abdomen is nondistended, soft and nontender. No organomegaly or masses felt. Normal bowel sounds heard. Central nervous system: Alert. Slightly improved Left hemiparesis, was able to move left arm. Extremities: Symmetric 5 x 5 power. Skin: No rashes, lesions or ulcers. Psychiatry: Judgement and insight appear UTD. Mood & affect UTD.    Data Reviewed: I have personally reviewed following labs and imaging studies  CBC: Recent Labs  Lab 01/28/23 0214 01/29/23 0451 01/30/23 0429  WBC 15.7* 24.1* 15.7*  NEUTROABS 13.2* 21.8* 13.5*  HGB 8.2* 8.0* 8.0*  HCT 31.3* 30.2* 29.4*  MCV 80.7 79.3* 77.6*  PLT 321 359 340    Basic Metabolic Panel: Recent Labs  Lab 01/28/23 0214 01/29/23 0451 01/30/23 0429  NA 137 141 144  K 4.2 4.0 3.9  CL 103 108 110  CO2 24 23 23   GLUCOSE 111* 117* 147*  BUN 62* 67* 71*  CREATININE 3.03* 2.97* 2.80*  CALCIUM 8.4* 8.3* 8.6*  MG  --  2.4  --     CBG: Recent Labs  Lab 01/29/23 2328 01/30/23 0352 01/30/23 0818 01/30/23 1206 01/30/23 1623  GLUCAP 135* 150* 119* 82 87    Recent Results (from the past 240 hours)  Culture, blood (Routine X 2) w Reflex to ID Panel     Status: None (Preliminary result)   Collection Time: 01/28/23  9:39 AM   Specimen: BLOOD  Result Value Ref Range Status   Specimen Description BLOOD BLOOD RIGHT ARM  Final   Special Requests   Final    BOTTLES DRAWN AEROBIC AND ANAEROBIC Blood Culture adequate volume   Culture   Final    NO GROWTH 2 DAYS Performed at [facility], [address], [place], [place] [address]    Report Status PENDING  Incomplete   Culture, blood (Routine X 2) w Reflex to ID Panel     Status: None (Preliminary result)   Collection Time: 01/28/23  9:46 AM   Specimen: BLOOD  Result Value Ref Range Status   Specimen Description BLOOD LEFT ANTECUBITAL  Final   Special Requests   Final    BOTTLES DRAWN AEROBIC AND ANAEROBIC Blood Culture adequate volume   Culture   Final    NO GROWTH 2 DAYS Performed at [facility], [address] [place], [place] [address]    Report Status PENDING  Incomplete  MRSA Next Gen by PCR, Nasal     Status: Abnormal   Collection Time: 01/28/23 10:42 AM   Specimen: Nasal Mucosa; Nasal Swab  Result Value Ref Range Status   MRSA by PCR Next Gen DETECTED (A) NOT  DETECTED Final    Comment: RESULT CALLED TO, READ BACK BY AND VERIFIED WITH: IRVING,L ON 01/28/23 AT 2125 BY PURDIE,J        The GeneXpert MRSA Assay (FDA approved for NASAL specimens only), is one component of a comprehensive MRSA colonization surveillance program. It is not intended to diagnose MRSA infection nor to guide or monitor treatment for MRSA infections. Performed at [facility], [address] [place], [place] [address]      Radiology Studies: DG CHEST PORT 1 VIEW Result Date: 01/30/2023 CLINICAL DATA:  Aspiration pneumonia. EXAM: PORTABLE CHEST 1 VIEW COMPARISON:  Chest x-ray dated January 28, 2023. FINDINGS: Unchanged cardiomegaly. New focal opacity in the right upper lobe. Unchanged linear scarring/atelectasis at the right lung base. No pneumothorax or pleural effusion. No acute osseous abnormality. IMPRESSION: 1. New focal opacity in the right upper lobe, concerning for pneumonia. Electronically Signed   By: Obie Dredge M.D.   On: 01/30/2023 10:03    Scheduled Meds:   stroke: early stages of recovery book   Does not apply Once   aspirin  300 mg Rectal Daily   Or   aspirin  81 mg Oral Daily   atorvastatin  40 mg Oral QHS   Chlorhexidine Gluconate Cloth  6 each Topical Daily   Chlorhexidine Gluconate  Cloth  6 each Topical Q0600   ferrous sulfate  325 mg Oral Q breakfast   folic acid  1 mg Oral Daily   gabapentin  100 mg Oral QHS   heparin  5,000 Units Subcutaneous Q8H   insulin aspart  0-9 Units Subcutaneous Q4H   ipratropium-albuterol  3 mL Nebulization Q6H   linaclotide  145 mcg Oral QAC breakfast   mupirocin ointment  1 Application Nasal BID   Continuous Infusions:  ampicillin-sulbactam (UNASYN) IV 3 g (01/30/23 1018)   dextrose 5 % and 0.9 % NaCl 65 mL/hr at 01/30/23 1536     LOS: 2 days   Critical Care Procedure Note Authorized and Performed by: Maryln Manuel MD  Total Critical Care time:  55 mins Due to a high probability of clinically significant, life threatening deterioration, the patient required my highest level of preparedness to intervene emergently and I personally spent this critical care time directly and personally managing the patient.  This critical care time included obtaining a history; examining the patient, pulse oximetry; ordering and review of studies; arranging urgent treatment with development of a management plan; evaluation of patient's response of treatment; frequent reassessment; and discussions with other providers.  This critical care time was performed to assess and manage the high probability of imminent and life threatening deterioration that could result in multi-organ failure.  It was exclusive of separately billable procedures and treating other patients and teaching time.    Standley Dakins, MD How to contact the [facility] Attending or Consulting provider 7A - 7P or covering provider during after hours 7P -7A, for this patient?  Check the care team in [facility] and look for a) attending/consulting TRH provider listed and b) the [facility] team listed Log into [email or web address] to find provider on call.  Locate the [facility] provider you are looking for under [facility] and page to a number that you can be directly reached. If you still have difficulty reaching the  provider, please page the [facility] (Director on Call) for the Hospitalists listed on amion for assistance.  01/30/2023, 4:36 PM

## 2023-01-30 NOTE — Progress Notes (Signed)
MD made aware of soft BPs, order placed to restart fluids

## 2023-01-30 NOTE — Progress Notes (Signed)
RT Attempted to place pt on Bipap for the night. Pt did not tolerate, complaining of "too much air" machine already on lowest settings. Pt resting comfortably on 3L  SPO2 99%. No increased WOB noted

## 2023-01-30 NOTE — Evaluation (Signed)
Occupational Therapy Evaluation Patient Details Name: [name] MRN: [id number] DOB: 23-May-1955 Today's Date: 01/30/2023   History of Present Illness LEN PRIMAS is a 68 year old male with chronic combined systolic and diastolic heart failure, hyperlipidemia, type 2 diabetes mellitus, diabetic polyneuropathy, hypertension, polysubstance abuse, stage III CKD history of cocaine abuse, failure to thrive, coronary artery disease, ischemic cardiomyopathy, history of LV mural thrombus, OSA, history of Fournier gangrene, GERD, who was recently hospitalized for acute heart failure and discharged to [facility] [place].     He was sent to the ED by EMS for lethargic and confusion since about 7 PM that evening.  He reportedly has normally been alert and oriented.  He was not answering questions.  Stroke was suspected and he was seen by teleneurologist.  TNK was not given as he was outside the window.  Neurology recommended MRI brain and routine stroke workup.  They recommended aspirin 325 mg daily.   Clinical Impression   Pt agreeable to OT and PT co-evaluation/treatment. Pt required max A for bed mobility to sit and to supine. Poor B UE strength with minimal A/ROM bilaterally. Pt generally very weak with repeated cuing to follow commands. Pt lethargic with eyes closed often and in severe pain if B LE below knees was touched. Pt left in the bed with call bell within reach and bed alarm set. Pt will benefit from continued OT in the hospital and recommended venue below to increase strength, balance, and endurance for safe ADL's.          If plan is discharge home, recommend the following: A lot of help with walking and/or transfers;A lot of help with bathing/dressing/bathroom;Assistance with feeding;Assistance with cooking/housework;Direct supervision/assist for medications management;Assist for transportation;Help with stairs or ramp for entrance    Functional Status Assessment  Patient has had a  recent decline in their functional status and demonstrates the ability to make significant improvements in function in a reasonable and predictable amount of time.  Equipment Recommendations  None recommended by OT           Precautions / Restrictions Precautions Precautions: Fall Restrictions Weight Bearing Restrictions Per Provider Order: No      Mobility Bed Mobility Overal bed mobility: Needs Assistance Bed Mobility: Supine to Sit, Sit to Supine     Supine to sit: Max assist, HOB elevated Sit to supine: Max assist   General bed mobility comments: Assit to move torso and B LE in and out of bed.    Transfers                   General transfer comment: not attempted due to poor sitting balance and strength.      Balance Overall balance assessment: Needs assistance Sitting-balance support: Feet supported, Bilateral upper extremity supported Sitting balance-Leahy Scale: Poor Sitting balance - Comments: poor seated at EOB Postural control: Posterior lean                                 ADL either performed or assessed with clinical judgement   ADL Overall ADL's : Needs assistance/impaired Eating/Feeding: Maximal assistance;Bed level   Grooming: Maximal assistance;Bed level   Upper Body Bathing: Maximal assistance;Bed level   Lower Body Bathing: Maximal assistance;Bed level   Upper Body Dressing : Maximal assistance;Bed level   Lower Body Dressing: Maximal assistance;Bed level   Toilet Transfer: Maximal assistance   Toileting- Clothing Manipulation and Hygiene: Maximal assistance;Bed  level   Tub/ Shower Transfer: Maximal assistance   Functional mobility during ADLs: Maximal assistance;Total assistance       Vision Patient Visual Report:  (vision history unknown) Additional Comments: Difficult to assess at this time given pt's arousal level and cognition.     Perception Perception: Not tested       Praxis Praxis: Not tested        Pertinent Vitals/Pain Pain Assessment Pain Assessment: Faces Faces Pain Scale: Hurts whole lot Pain Location: B LE bleow knee Pain Descriptors / Indicators: Grimacing, Guarding, Sharp, Discomfort, Moaning Pain Intervention(s): Limited activity within patient's tolerance, Monitored during session, Repositioned     Extremity/Trunk Assessment Upper Extremity Assessment Upper Extremity Assessment: Difficult to assess due to impaired cognition;RUE deficits/detail;LUE deficits/detail RUE Deficits / Details: Limited to ~75 to 90% P/ROM shoulder flexion; very weak with minimal active movement. 2-/5 grasp LUE Deficits / Details: Very weak; WFL P/ROM; minmal A/ROM; 2-/5 grasp   Lower Extremity Assessment Lower Extremity Assessment: Defer to PT evaluation       Communication Communication Communication: Difficulty following commands/understanding Following commands: Follows one step commands inconsistently Cueing Techniques: Verbal cues;Tactile cues   Cognition Arousal: Alert Behavior During Therapy: Flat affect, Anxious Overall Cognitive Status: Impaired/Different from baseline Area of Impairment: Attention                               General Comments: Frequent verbal cuing and repeating of directions                      Home Living Family/patient expects to be discharged to:: Private residence Living Arrangements: Non-relatives/Friends Available Help at Discharge: Family;Friend(s);Available PRN/intermittently;Personal care attendant Type of Home: Mobile home Home Access: Stairs to enter Entrance Stairs-Number of Steps: 2 Entrance Stairs-Rails: Right;Left;Can reach both Home Layout: One level         Bathroom Toilet: Handicapped height Bathroom Accessibility: Yes   Home Equipment: Rollator (4 wheels);Shower seat;Toilet riser          Prior Functioning/Environment Prior Level of Function : Independent/Modified Independent              Mobility Comments: Household ambulation using Rollator ADLs Comments: Doesn't drive and says he has help with shopping. independent with ADLs, reports aide coming in 7x/week with cooking and cleaning (per chart review)        OT Problem List: Decreased strength;Decreased range of motion;Decreased activity tolerance;Impaired balance (sitting and/or standing);Decreased cognition;Pain;Impaired UE functional use      OT Treatment/Interventions: Self-care/ADL training;Therapeutic exercise;Therapeutic activities;Patient/family education;Balance training;Neuromuscular education    OT Goals(Current goals can be found in the care plan section) Acute Rehab OT Goals Patient Stated Goal: none stated OT Goal Formulation: Patient unable to participate in goal setting Time For Goal Achievement: 02/13/23 Potential to Achieve Goals: Fair  OT Frequency: Min 2X/week    Co-evaluation PT/OT/SLP Co-Evaluation/Treatment: Yes Reason for Co-Treatment: To address functional/ADL transfers   OT goals addressed during session: ADL's and self-care                       End of Session Equipment Utilized During Treatment: Oxygen  Activity Tolerance: Patient limited by lethargy Patient left: in bed;with call bell/phone within reach;with bed alarm set  OT Visit Diagnosis: Unsteadiness on feet (R26.81);Other abnormalities of gait and mobility (R26.89);Muscle weakness (generalized) (M62.81);Other symptoms and signs involving the nervous system (R29.898)  Time: 1610-9604 OT Time Calculation (min): 11 min Charges:  OT General Charges $OT Visit: 1 Visit OT Evaluation $OT Eval Low Complexity: 1 Low  Tyger Oka OT, MOT  Danie Chandler 01/30/2023, 9:19 AM

## 2023-01-30 NOTE — Plan of Care (Signed)
  Problem: Acute Rehab OT Goals (only OT should resolve) Goal: Pt. Will Perform Eating Flowsheets (Taken 01/30/2023 0921) Pt Will Perform Eating: with min assist Goal: Pt. Will Perform Grooming Flowsheets (Taken 01/30/2023 0921) Pt Will Perform Grooming:  with min assist  sitting Goal: Pt. Will Perform Upper Body Dressing Flowsheets (Taken 01/30/2023 0921) Pt Will Perform Upper Body Dressing:  with min assist  sitting Goal: Pt. Will Perform Lower Body Dressing Flowsheets (Taken 01/30/2023 0921) Pt Will Perform Lower Body Dressing:  with min assist  sitting/lateral leans Goal: Pt. Will Transfer To Toilet Flowsheets (Taken 01/30/2023 [phone]) Pt Will Transfer to Toilet:  with mod assist  stand pivot transfer Goal: Pt. Will Perform Toileting-Clothing Manipulation Flowsheets (Taken 01/30/2023 0921) Pt Will Perform Toileting - Clothing Manipulation and hygiene:  with min assist  with mod assist  sitting/lateral leans Goal: Pt/Caregiver Will Perform Home Exercise Program Flowsheets (Taken 01/30/2023 [phone]) Pt/caregiver will Perform Home Exercise Program:  Increased strength  Increased ROM  Both right and left upper extremity  With minimal assist  Coran Dipaola OT, MOT

## 2023-01-30 NOTE — Consult Note (Signed)
I connected with  [name] on 01/30/23 by a video enabled telemedicine application and verified that I am speaking with the correct person using two identifiers.   I discussed the limitations of evaluation and management by telemedicine. The patient expressed understanding and agreed to proceed.  Location of patient; [facility] Location of physician: [facility]  Neurology Consultation Reason for Consult: stroke Referring Physician: Dr Standley Dakins  CC: ams  History is obtained from:   HPI: [name] is a 68 y.o. male who is  [facility] resident with past medical history of strokes, meningioma, hypertension, diabetes, LV thrombus was on Xarelto more than 2 years ago, CKD, CHF, coronary artery disease who was brought in after altered mental status.  Patient is confused and unable to provide any history.  Per chart review, patient was recently admitted from 01/14/2023 to 01/17/2023 for acute on chronic congestive heart failure as well as AKI on CKD.  He was then seen again in the emergency room on 01/21/2023 for lower abdominal pain, nausea and vomiting.  CT Abdo and pelvis was done which was concerning for enteritis, no bowel obstruction.  He was given Zofran followed by Compazine which finally helped with the symptoms and he was discharged.  He was then brought back on 01/22/2023 for altered mental status, nausea, vomiting and low oxygen saturations.  He was admitted on the medicine team.  MRI brain was obtained and showed acute ischemic stroke.  Therefore neurology was consulted for further recommendations  Last known normal: Unclear as patient is confused Event happened at nursing facility No tPA at outside window No thrombectomy as no LVO mRS 4  ROS: Unable to obtain due to altered mental status.   Past Medical History:  Diagnosis Date   Bulging lumbar disc    CAD (coronary artery disease) [phone]   a. prior LAD stenting. b. s/p DES to [facility] 08/2015. c. 04/2016  Cardiac cath at [facility]. Patent stent in the PLAD and RCA. Diffuse dLAD, OM2, and  RPDA disease. d. DES to PDA and distal RCA 03/2019    Chronic lower back pain    CKD (chronic kidney disease), stage II    Cocaine abuse (HCC)    Cyst of epididymis    DM2 (diabetes mellitus, type 2) (HCC)    Essential hypertension    Fournier gangrene    GERD (gastroesophageal reflux disease)    Headache    History of pneumonia    Hyperlipidemia    Ischemic cardiomyopathy    LV (left ventricular) mural thrombus    Sleep apnea     Family History  Problem Relation Age of Onset   Hypertension Mother    Diabetes Mother     Social History:  reports that he has been smoking cigarettes. He has a 24 pack-year smoking history. He has been exposed to tobacco smoke. He has never used smokeless tobacco. He reports that he does not currently use alcohol. He reports that he does not currently use drugs after having used the following drugs: Cocaine.   Medications Prior to Admission  Medication Sig Dispense Refill Last Dose/Taking   aspirin EC (ASPIRIN LOW DOSE) 81 MG tablet Take 1 tablet (81 mg total) by mouth daily. Swallow whole.   Past Week   ferrous sulfate 325 (65 FE) MG tablet Take 1 tablet (325 mg total) by mouth daily with breakfast.   Past Week   folic acid (FOLVITE) 1 MG tablet Take 1 tablet (1  mg total) by mouth daily.   Past Week   gabapentin (NEURONTIN) 300 MG capsule Take 1 capsule (300 mg total) by mouth 3 (three) times daily. 90 capsule 1 Past Week   insulin glargine (LANTUS) 100 UNIT/ML Solostar Pen Inject 5 Units into the skin daily. (Patient taking differently: Inject 5 Units into the skin at bedtime.) 15 mL 0 Past Week   JARDIANCE 10 MG TABS tablet Take 10 mg by mouth daily.   Past Week   linaclotide (LINZESS) 145 MCG CAPS capsule Take 1 capsule (145 mcg total) by mouth daily before breakfast. 30 capsule 1 Past Week   loperamide (IMODIUM) 2 MG capsule Take 1 capsule (2 mg total) by  mouth 4 (four) times daily as needed for diarrhea or loose stools. 12 capsule 0 Past Week   midodrine (PROAMATINE) 10 MG tablet Take 1 tablet (10 mg total) by mouth 3 (three) times daily with meals. 90 tablet 1 Past Week   ondansetron (ZOFRAN-ODT) 4 MG disintegrating tablet 4mg  ODT q4 hours prn nausea/vomit 10 tablet 0 Past Week   pantoprazole (PROTONIX) 40 MG tablet Take 1 tablet (40 mg total) by mouth daily. 30 tablet 2 Past Week   polyethylene glycol (MIRALAX / GLYCOLAX) 17 g packet Take 17 g by mouth daily as needed for mild constipation.   Past Week   prochlorperazine (COMPAZINE) 10 MG tablet Take 1 tablet (10 mg total) by mouth every 6 (six) hours as needed for nausea or vomiting. 20 tablet 0 Past Week   torsemide (DEMADEX) 20 MG tablet Take 2 tablets (40 mg total) by mouth daily.   Past Week   atorvastatin (LIPITOR) 40 MG tablet Take 1 tablet (40 mg total) by mouth at bedtime. 30 tablet 2    carvedilol (COREG) 3.125 MG tablet Take 1 tablet (3.125 mg total) by mouth 2 (two) times daily with a meal. 60 tablet 2       Exam: Current vital signs: BP 122/80   Pulse 96   Temp (!) 97.3 F (36.3 C) (Axillary)   Resp 15   Ht 5\' 9"  (1.753 m)   Wt 97.3 kg   SpO2 100%   BMI 31.68 kg/m  Vital signs in last 24 hours: Temp:  [97.3 F (36.3 C)-98.4 F (36.9 C)] 97.3 F (36.3 C) (01/20 0821) Pulse Rate:  [80-96] 96 (01/19 2004) Resp:  [11-20] 15 (01/19 2004) BP: (100-136)/(62-81) 122/80 (01/19 2000) SpO2:  [96 %-100 %] 100 % (01/19 2004) FiO2 (%):  [30 %] 30 % (01/20 0153) Weight:  [97.3 kg] 97.3 kg (01/20 0416)   Physical Exam  Constitutional: Laying in bed, not in apparent distress Neuro: Drowsy but opens eyes to repeated verbal stimulation, able to tell me his name, could not answer rest of the orientation questions, did squeeze hand but did not follow other commands, speech is significantly dysarthric (could be slightly worse than baseline per chart review), does not have any forced  gaze deviation or apparent facial asymmetry, spontaneously moving bilateral upper extremities with left side appearing weaker than right, left lower extremity appears to be externally rotated, winces and moans to noxious stimuli therefore difficult to examine bilateral lower extremities via tele  NIHSS 19   INPUTS: 1A: Level of consciousness --> 2 = Requires repeated stimulation to arouse 1B: Ask month and age --> 2 = 0 questions right  1C: 'Blink eyes' & 'squeeze hands' --> 1 = Performs 1 task 2: Horizontal extraocular movements --> 0 = Normal 3: Visual fields -->  0 = No visual loss 4: Facial palsy --> 0 = Normal symmetry 5A: Left arm motor drift --> 2 = Some effort against gravity 5B: Right arm motor drift --> 2 = Some effort against gravity 6A: Left leg motor drift --> 3 = No effort against gravity 6B: Right leg motor drift --> 3 = No effort against gravity 7: Limb Ataxia --> 0 = Does not understand 8: Sensation --> 0 = Normal; no sensory loss 9: Language/aphasia --> 2 = Severe aphasia: fragmentary expression, inference needed, cannot identify materials 10: Dysarthria --> 2 = Severe dysarthria: unintelligible slurring or out of proportion to dysphasia 11: Extinction/inattention --> 0 = No abnormality    I have reviewed labs in epic and the results pertinent to this consultation are: CBC:  Recent Labs  Lab 01/29/23 0451 01/30/23 0429  WBC 24.1* 15.7*  NEUTROABS 21.8* 13.5*  HGB 8.0* 8.0*  HCT 30.2* 29.4*  MCV 79.3* 77.6*  PLT 359 340    Basic Metabolic Panel:  Lab Results  Component Value Date   NA 144 01/30/2023   K 3.9 01/30/2023   CO2 23 01/30/2023   GLUCOSE 147 (H) 01/30/2023   BUN 71 (H) 01/30/2023   CREATININE 2.80 (H) 01/30/2023   CALCIUM 8.6 (L) 01/30/2023   GFRNONAA 24 (L) 01/30/2023   GFRAA >60 05/22/2019   Lipid Panel:  Lab Results  Component Value Date   LDLCALC 29 01/29/2023   HgbA1c:  Lab Results  Component Value Date   HGBA1C 7.2 (H)  01/28/2023   Urine Drug Screen:     Component Value Date/Time   LABOPIA NONE DETECTED 01/28/2023 0422   COCAINSCRNUR NONE DETECTED 01/28/2023 0422   LABBENZ NONE DETECTED 01/28/2023 0422   AMPHETMU NONE DETECTED 01/28/2023 0422   THCU NONE DETECTED 01/28/2023 0422   LABBARB NONE DETECTED 01/28/2023 0422    Alcohol Level     Component Value Date/Time   ETH <10 01/28/2023 0214     I have reviewed the images obtained:  CT Head without contrast 01/28/2023: No acute intracranial abnormality. 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   CT angio Head and Neck with contrast 01/28/2023:No emergent large vessel occlusion.  Severe stenosis of the distal cavernous segment and the right clinoid segment of the right internal carotid artery.  A 50% stenosis of the proximal left internal carotid artery.  MRI Brain without contrast 01/28/2023:  Small acute infarct in the left insular white matter. Known meningioma in the right middle cranial fossa with edema in the adjacent temporal lobe similar to 11/06/2022. Chronic microhemorrhage in the central pons.      ASSESSMENT/PLAN: 68 year old male with multiple medical comorbidities who presented with altered mental status.   Acute ischemic stroke (incidental) -Etiology: Likely small vessel disease versus embolic -The stroke is incidental and is not the cause of patient's current presentation  Recommendations: -LDL 29, continue rosuvastatin 40 mg daily -A1c 7.2, management of comorbidities per primary team -Continue aspirin 81  mg daily.   -Patient is allergic to Plavix.  Therefore recommend ordering Brilinta 90mg  BID for 30 days once able to tolerate p.o. for secondary stroke prevention -Will also order TTE as patient has history of mural thrombus in the past although low suspicion -Recommend 30-day cardiac monitor to look for paroxysmal A-fib -PT/OT -Follow-up with neurology in 3 months (order placed)  Acute  encephalopathy, toxic-metabolic -This is most likely secondary to uremia, aspiration pneumonia.  It is very likely patient has underlying undiagnosed dementia -  Will check for reversible causes of dementia -Recommend checking ABG to look for hypercarbia -Recommend EEG although low suspicion -This is patient's 11th visit to the hospital in the last 4 months.  Will  benefit from goals of care discussion with family -Discussed plan with Dr. Laural Benes via secure chat  Thank you for allowing [place] to participate in the care of this patient. If you have any further questions, please contact  me or neurohospitalist.   Lindie Spruce [facility] neurohospitalist

## 2023-01-30 NOTE — Evaluation (Signed)
Clinical/Bedside Swallow Evaluation Patient Details  Name: [name] MRN: [id number] Date of Birth: May 13, 1955  Today's Date: 01/30/2023 Time: SLP Start Time (ACUTE ONLY): 1345 SLP Stop Time (ACUTE ONLY): 1410 SLP Time Calculation (min) (ACUTE ONLY): 25 min  Past Medical History:  Past Medical History:  Diagnosis Date   Bulging lumbar disc    CAD (coronary artery disease) [id number]   a. prior LAD stenting. b. s/p DES to [facility] 08/2015. c. 04/2016 Cardiac cath at [facility]. Patent stent in the PLAD and RCA. Diffuse dLAD, OM2, and  RPDA disease. d. DES to PDA and distal RCA 03/2019    Chronic lower back pain    CKD (chronic kidney disease), stage II    Cocaine abuse (HCC)    Cyst of epididymis    DM2 (diabetes mellitus, type 2) (HCC)    Essential hypertension    Fournier gangrene    GERD (gastroesophageal reflux disease)    Headache    History of pneumonia    Hyperlipidemia    Ischemic cardiomyopathy    LV (left ventricular) mural thrombus    Sleep apnea    Past Surgical History:  Past Surgical History:  Procedure Laterality Date   APPENDECTOMY     BIOPSY  11/11/2022   Procedure: BIOPSY;  Surgeon: Franky Macho, MD;  [facility];  Service: Endoscopy;;   BRONCHIAL NEEDLE ASPIRATION BIOPSY  11/12/2021   Procedure: BRONCHIAL NEEDLE ASPIRATION BIOPSIES;  Surgeon: Omar Person, MD;  Location: [facility] ENDOSCOPY;  Service: Pulmonary;;   CARDIAC CATHETERIZATION N/A 09/07/2015   Procedure: Left Heart Cath and Coronary Angiography;  Surgeon: Marykay Lex, MD;  Location: [facility] INVASIVE CV LAB;  Service: Cardiovascular;  Laterality: N/A;   CARDIAC CATHETERIZATION N/A 09/07/2015   Procedure: Coronary Stent Intervention;  Surgeon: Marykay Lex, MD;  Location: [facility] INVASIVE CV LAB;  Service: Cardiovascular;  Laterality: N/A;   COLONOSCOPY WITH PROPOFOL N/A 11/11/2022   Procedure: COLONOSCOPY WITH PROPOFOL;  Surgeon: Franky Macho, MD;  [facility]: [facility];   Service: Endoscopy;  Laterality: N/A;   CORONARY ANGIOGRAM  09/07/13   residual RCA and OM disease   CORONARY ANGIOPLASTY WITH STENT PLACEMENT     CORONARY STENT INTERVENTION N/A 10/16/2018   Procedure: CORONARY STENT INTERVENTION;  Surgeon: Kathleene Hazel, MD;  Location: [facility] INVASIVE CV LAB;  Service: Cardiovascular;  Laterality: N/A;   CORONARY STENT INTERVENTION N/A 03/11/2019   Procedure: CORONARY STENT INTERVENTION;  Surgeon: Corky Crafts, MD;  Location: [facility] INVASIVE CV LAB;  Service: Cardiovascular;  Laterality: N/A;   ESOPHAGOGASTRODUODENOSCOPY (EGD) WITH PROPOFOL N/A 11/11/2022   Procedure: ESOPHAGOGASTRODUODENOSCOPY (EGD) WITH PROPOFOL;  Surgeon: Franky Macho, MD;  [facility];  Service: Endoscopy;  Laterality: N/A;   FRACTURE SURGERY     HEMOSTASIS CLIP PLACEMENT  11/11/2022   Procedure: HEMOSTASIS CLIP PLACEMENT;  Surgeon: Franky Macho, MD;  [facility];  Service: Endoscopy;;   INCISION AND DRAINAGE OF WOUND Left 05/19/2019   Procedure: DEBRIDEMENT LEFT GROIN;  Surgeon: Violeta Gelinas, MD;  Location: [facility] OR;  Service: General;  Laterality: Left;   INSERTION OF ILIAC STENT Left 11/30/2017   Left external illiac stent   INSERTION OF ILIAC STENT  11/30/2017   Procedure: Insertion Of Iliac Stent;  Surgeon: Runell Gess, MD;  Location: [facility] INVASIVE CV LAB;  Service: Cardiovascular;;  Left external illiac stent   KNEE ARTHROSCOPY Left    KNEE SURGERY     "ligaments,  cartilage; tendon, put a pin in" (11/30/2017)   LEFT HEART CATH Bilateral 07/08/2012   Procedure: LEFT HEART CATH;  Surgeon: Corky Crafts, MD;  Location: [facility] CATH LAB;  Service: Cardiovascular;  Laterality: Bilateral;   LEFT HEART CATH AND CORONARY ANGIOGRAPHY N/A 10/16/2018   Procedure: LEFT HEART CATH AND CORONARY ANGIOGRAPHY;  Surgeon: Kathleene Hazel, MD;  Location: [facility] INVASIVE CV LAB;  Service: Cardiovascular;  Laterality: N/A;   LEFT HEART CATH AND CORONARY  ANGIOGRAPHY N/A 03/11/2019   Procedure: LEFT HEART CATH AND CORONARY ANGIOGRAPHY;  Surgeon: Corky Crafts, MD;  Location: [facility] INVASIVE CV LAB;  Service: Cardiovascular;  Laterality: N/A;   LEFT HEART CATHETERIZATION WITH CORONARY ANGIOGRAM N/A 09/06/2013   STEMI, 2nd ISR LAD. Procedure: LEFT HEART CATHETERIZATION WITH CORONARY ANGIOGRAM;  Surgeon: Corky Crafts, MD;  Location: [facility] CATH LAB;  Service: Cardiovascular;  Laterality: N/A;   LOWER EXTREMITY ANGIOGRAPHY N/A 11/30/2017   Procedure: LOWER EXTREMITY ANGIOGRAPHY;  Surgeon: Runell Gess, MD;  Location: [facility] INVASIVE CV LAB;  Service: Cardiovascular;  Laterality: N/A;   PERCUTANEOUS CORONARY STENT INTERVENTION (PCI-S)  07/08/2012   Procedure: PERCUTANEOUS CORONARY STENT INTERVENTION (PCI-S);  Surgeon: Corky Crafts, MD;  Location: [facility] CATH LAB;  Service: Cardiovascular;;  DES LAD   PERCUTANEOUS CORONARY STENT INTERVENTION (PCI-S) N/A 09/06/2013   Procedure: PERCUTANEOUS CORONARY STENT INTERVENTION (PCI-S);  Surgeon: Corky Crafts, MD;  Location: [facility] CATH LAB;  Service: Cardiovascular;  Laterality: N/A;  Mid LAD 3.0/24mm Promus   POLYPECTOMY  11/11/2022   Procedure: POLYPECTOMY INTESTINAL;  Surgeon: Franky Macho, MD;  [facility];  Service: Endoscopy;;   SCLEROTHERAPY  11/11/2022   Procedure: Susa Day;  Surgeon: Franky Macho, MD;  [facility];  Service: Endoscopy;;   SUBMUCOSAL TATTOO INJECTION  11/11/2022   Procedure: SUBMUCOSAL TATTOO INJECTION;  Surgeon: Franky Macho, MD;  [facility];  Service: Endoscopy;;   THORACENTESIS Right 11/10/2021   Procedure: Alanson Puls;  Surgeon: Leslye Peer, MD;  Location: [facility] ENDOSCOPY;  Service: Cardiopulmonary;  Laterality: Right;   VIDEO BRONCHOSCOPY WITH ENDOBRONCHIAL ULTRASOUND Right 11/12/2021   Procedure: VIDEO BRONCHOSCOPY WITH ENDOBRONCHIAL ULTRASOUND;  Surgeon: Omar Person, MD;  Location: [facility] ENDOSCOPY;  Service:  Pulmonary;  Laterality: Right;   WOUND DEBRIDEMENT Left 05/20/2019   Procedure: DEBRIDEMENT GROIN;  Surgeon: Kinsinger, De Blanch, MD;  Location: [facility] OR;  Service: General;  Laterality: Left;   WRIST FRACTURE SURGERY Bilateral    HPI:  [name] is a 68 y.o. male who is  [facility] [employer] resident with past medical history of strokes, meningioma, hypertension, diabetes, LV thrombus was on Xarelto more than 2 years ago, CKD, CHF, coronary artery disease who was brought in after altered mental status.     Patient is confused and unable to provide any history.  Per chart review, patient was recently admitted from 01/14/2023 to 01/17/2023 for acute on chronic congestive heart failure as well as AKI on CKD.  He was then seen again in the emergency room on 01/21/2023 for lower abdominal pain, nausea and vomiting.  CT Abdo and pelvis was done which was concerning for enteritis, no bowel obstruction.  He was given Zofran followed by Compazine which finally helped with the symptoms and he was discharged.  He was then brought back on 01/22/2023 for altered mental status, nausea, vomiting and low oxygen saturations.  He was admitted on the medicine team.  MRI brain was obtained and showed acute ischemic stroke.  Therefore neurology was consulted for further recommendations . BSE/SLE requested. Pt failed the [place].    Assessment / Plan / Recommendation  Clinical Impression  Clinical swallow evaluation completed at bedside with limited participation due to progressive refusals to take PO. Pt alert throughout and initially followed simple commands and oral care, however became increasingly less agreeable to PO intake. Pt verbalized pain in his feet and then his back- SLP adjusted bed. Pt with sparse dentition. He presents with reduced labial seal and reduced lingual movement with oral holding of limited POs presented (ice chips, water, puree). Pt stated that he did not want anything to eat or drink, despite offering  water, soda, ice cream, etc. He then began waving hand away and clamping lips and turning head away. Recommend NPO except for ice chips/small sips of water after oral care and ok for PO medication if Pt alert and willing to accept. He is generally just refusing at this time. Above to RN. SLP will check back tomorrow. SLP Visit Diagnosis: Dysphagia, unspecified (R13.10)    Aspiration Risk  Mild aspiration risk;Risk for inadequate nutrition/hydration    Diet Recommendation Ice chips PRN after oral care;NPO except meds;Free water protocol after oral care    Medication Administration: Crushed with puree Supervision: Staff to assist with self feeding;Full supervision/cueing for compensatory strategies Compensations: Small sips/bites Postural Changes: Seated upright at 90 degrees;Remain upright for at least 30 minutes after po intake    Other  Recommendations Oral Care Recommendations: Oral care prior to ice chip/H20;Staff/trained caregiver to provide oral care    Recommendations for follow up therapy are one component of a multi-disciplinary discharge planning process, led by the attending physician.  Recommendations may be updated based on patient status, additional functional criteria and insurance authorization.  Follow up Recommendations Follow physician's recommendations for discharge plan and follow up therapies      Assistance Recommended at Discharge    Functional Status Assessment Patient has had a recent decline in their functional status and demonstrates the ability to make significant improvements in function in a reasonable and predictable amount of time.  Frequency and Duration min 2x/week  1 week       Prognosis Prognosis for improved oropharyngeal function: Fair Barriers to Reach Goals: Behavior      Swallow Study   General Date of Onset: 01/28/23 HPI: [name] is a 68 y.o. male who is  [facility] [employer] resident with past medical history of strokes, meningioma,  hypertension, diabetes, LV thrombus was on Xarelto more than 2 years ago, CKD, CHF, coronary artery disease who was brought in after altered mental status.     Patient is confused and unable to provide any history.  Per chart review, patient was recently admitted from 01/14/2023 to 01/17/2023 for acute on chronic congestive heart failure as well as AKI on CKD.  He was then seen again in the emergency room on 01/21/2023 for lower abdominal pain, nausea and vomiting.  CT Abdo and pelvis was done which was concerning for enteritis, no bowel obstruction.  He was given Zofran followed by Compazine which finally helped with the symptoms and he was discharged.  He was then brought back on 01/22/2023 for altered mental status, nausea, vomiting and low oxygen saturations.  He was admitted on the medicine team.  MRI brain was obtained and showed acute ischemic stroke.  Therefore neurology was consulted for further recommendations . BSE/SLE requested. Pt failed the [place]. Type of Study: Bedside Swallow Evaluation Diet Prior to  this Study: NPO Temperature Spikes Noted: No Respiratory Status: Nasal cannula History of Recent Intubation: No Behavior/Cognition: Uncooperative;Alert Oral Cavity Assessment: Within Functional Limits Oral Care Completed by SLP: Yes Oral Cavity - Dentition: Poor condition;Missing dentition Vision: Impaired for self-feeding Self-Feeding Abilities: Total assist Patient Positioning: Upright in bed Baseline Vocal Quality: Normal Volitional Cough: Cognitively unable to elicit Volitional Swallow: Unable to elicit    Oral/Motor/Sensory Function Overall Oral Motor/Sensory Function: Generalized oral weakness   Ice Chips Ice chips: Within functional limits Presentation: Spoon   Thin Liquid Thin Liquid: Impaired Presentation: Spoon;Cup Oral Phase Impairments: Reduced labial seal Oral Phase Functional Implications: Oral holding    Nectar Thick Nectar Thick Liquid: Not tested   Honey Thick Honey  Thick Liquid: Not tested   Puree Puree: Impaired Presentation: Spoon Oral Phase Impairments: Poor awareness of bolus;Reduced lingual movement/coordination Oral Phase Functional Implications: Oral holding   Solid     Solid: Not tested     Thank you,  [name], CCC-SLP [phone]  [name] 01/30/2023,2:15 PM

## 2023-01-30 NOTE — Progress Notes (Signed)
Physical Therapy Treatment Patient Details Name: [name] MRN: [id number] DOB: 07/09/1955 Today's Date: 01/30/2023   History of Present Illness [name] is a 68 year old male with chronic combined systolic and diastolic heart failure, hyperlipidemia, type 2 diabetes mellitus, diabetic polyneuropathy, hypertension, polysubstance abuse, stage III CKD history of cocaine abuse, failure to thrive, coronary artery disease, ischemic cardiomyopathy, history of LV mural thrombus, OSA, history of Fournier gangrene, GERD, who was recently hospitalized for acute heart failure and discharged to [facility].     He was sent to the ED by EMS for lethargic and confusion since about 7 PM that evening.  He reportedly has normally been alert and oriented.  He was not answering questions.  Stroke was suspected and he was seen by teleneurologist.  TNK was not given as he was outside the window.  Neurology recommended MRI brain and routine stroke workup.  They recommended aspirin 325 mg daily.    PT Comments  Patient demonstrates slow labored movement for sitting up at bedside with c/o c/o severe pain with pressure to and movement of ankles and feet.  Patient had frequent posterior leaning once seated at bedside and unable to attempt sit to stands or transfers mostly due to severe pain in legs.  Patient required Max assist for repositioning when put back to bed. Patient will benefit from continued skilled physical therapy in hospital and recommended venue below to increase strength, balance, endurance for safe ADLs and gait.      If plan is discharge home, recommend the following: A lot of help with bathing/dressing/bathroom;A lot of help with walking and/or transfers;Help with stairs or ramp for entrance;Assistance with cooking/housework   Can travel by private vehicle     No  Equipment Recommendations  None recommended by PT    Recommendations for Other Services       Precautions / Restrictions  Precautions Precautions: Fall Restrictions Weight Bearing Restrictions Per Provider Order: No     Mobility  Bed Mobility Overal bed mobility: Needs Assistance Bed Mobility: Supine to Sit, Sit to Supine     Supine to sit: Max assist, HOB elevated Sit to supine: Max assist   General bed mobility comments: slow labored movement with limited use of BUE due to weakness and c/o severe pain with pressure to legs below the knees    Transfers                        Ambulation/Gait                   Stairs             Wheelchair Mobility     Tilt Bed    Modified Rankin (Stroke Patients Only)       Balance Overall balance assessment: Needs assistance Sitting-balance support: Feet supported, Bilateral upper extremity supported Sitting balance-Leahy Scale: Poor Sitting balance - Comments: poor seated at EOB                                    Cognition Arousal: Alert Behavior During Therapy: Flat affect Overall Cognitive Status: Impaired/Different from baseline Area of Impairment: Attention                   Current Attention Level: Alternating           General Comments: Frequent verbal cuing and repeating of directions  Exercises      General Comments        Pertinent Vitals/Pain Pain Assessment Pain Assessment: Faces Faces Pain Scale: Hurts whole lot Pain Location: B LE bleow knee Pain Descriptors / Indicators: Grimacing, Guarding, Sharp, Discomfort, Moaning Pain Intervention(s): Limited activity within patient's tolerance, Monitored during session, Repositioned    Home Living                          Prior Function            PT Goals (current goals can now be found in the care plan section) Acute Rehab PT Goals Patient Stated Goal: return home PT Goal Formulation: With patient Time For Goal Achievement: 02/12/23 Potential to Achieve Goals: Good Progress towards PT goals:  Progressing toward goals    Frequency    Min 3X/week      PT Plan      Co-evaluation PT/OT/SLP Co-Evaluation/Treatment: Yes Reason for Co-Treatment: To address functional/ADL transfers;Complexity of the patient's impairments (multi-system involvement) PT goals addressed during session: Mobility/safety with mobility;Balance        AM-PAC PT "6 Clicks" Mobility   Outcome Measure  Help needed turning from your back to your side while in a flat bed without using bedrails?: A Lot Help needed moving from lying on your back to sitting on the side of a flat bed without using bedrails?: A Lot Help needed moving to and from a bed to a chair (including a wheelchair)?: Total Help needed standing up from a chair using your arms (e.g., wheelchair or bedside chair)?: Total Help needed to walk in hospital room?: Total Help needed climbing 3-5 steps with a railing? : Total 6 Click Score: 8    End of Session   Activity Tolerance: Patient tolerated treatment well;Patient limited by fatigue;Patient limited by lethargy;Patient limited by pain Patient left: in bed Nurse Communication: Mobility status PT Visit Diagnosis: Unsteadiness on feet (R26.81);Other abnormalities of gait and mobility (R26.89);Muscle weakness (generalized) (M62.81)     Time: 0812-0829 PT Time Calculation (min) (ACUTE ONLY): 17 min  Charges:    $Therapeutic Activity: 8-22 mins PT General Charges $$ ACUTE PT VISIT: 1 Visit                     1:51 PM, 01/30/23 [name], MPT Physical Therapist with [facility] 336 [phone] office [phone] mobile phone

## 2023-01-30 NOTE — Progress Notes (Signed)
2D echo attempted, lab in room. Will try later

## 2023-01-30 NOTE — Progress Notes (Signed)
  Echocardiogram 2D Echocardiogram has been performed.  [name] 01/30/2023, 4:25 PM

## 2023-01-30 NOTE — Progress Notes (Signed)
01/30/2023 6:25 PM  Discussed with Dr. Danielle Dess regarding meningioma and patient's encephalopathy symptoms.  He recommended decadron 2-4 mg BID and if this helps to improve his symptoms we could discharge him on this and have him follow up with Dr. Danielle Dess outpatient to discuss surgery to remove the meningioma.  Will start decadron 4 g IV BID  C. Laural Benes, MD How to contact the [facility] Attending or Consulting provider 7A - 7P or covering provider during after hours 7P -7A, for this patient?  Check the care team in [facility] and look for a) attending/consulting [facility] provider listed and b) the [facility] team listed Log into [email or web address] and use [place]'s universal password to access. If you do not have the password, please contact the hospital operator. Locate the [facility] provider you are looking for under [facility] and page to a number that you can be directly reached. If you still have difficulty reaching the provider, please page the [facility] (Director on Call) for the Hospitalists listed on amion for assistance.

## 2023-01-30 NOTE — Progress Notes (Signed)
EEG complete - results pending 

## 2023-01-30 NOTE — Progress Notes (Addendum)
Attempted to administer ordered aspirin suppository, to attempted to punch nurse and yelled out, "don't touch me!" MD made aware.

## 2023-01-31 ENCOUNTER — Encounter (HOSPITAL_COMMUNITY): Payer: Self-pay | Admitting: Family Medicine

## 2023-01-31 DIAGNOSIS — G4733 Obstructive sleep apnea (adult) (pediatric): Secondary | ICD-10-CM

## 2023-01-31 DIAGNOSIS — Z7189 Other specified counseling: Secondary | ICD-10-CM

## 2023-01-31 DIAGNOSIS — I252 Old myocardial infarction: Secondary | ICD-10-CM | POA: Diagnosis not present

## 2023-01-31 DIAGNOSIS — E870 Hyperosmolality and hypernatremia: Secondary | ICD-10-CM

## 2023-01-31 DIAGNOSIS — G9341 Metabolic encephalopathy: Secondary | ICD-10-CM

## 2023-01-31 DIAGNOSIS — R569 Unspecified convulsions: Secondary | ICD-10-CM | POA: Diagnosis not present

## 2023-01-31 DIAGNOSIS — I639 Cerebral infarction, unspecified: Secondary | ICD-10-CM | POA: Diagnosis not present

## 2023-01-31 DIAGNOSIS — M109 Gout, unspecified: Secondary | ICD-10-CM | POA: Diagnosis present

## 2023-01-31 DIAGNOSIS — R4182 Altered mental status, unspecified: Secondary | ICD-10-CM | POA: Diagnosis not present

## 2023-01-31 DIAGNOSIS — N179 Acute kidney failure, unspecified: Secondary | ICD-10-CM

## 2023-01-31 DIAGNOSIS — L8990 Pressure ulcer of unspecified site, unspecified stage: Secondary | ICD-10-CM | POA: Diagnosis present

## 2023-01-31 DIAGNOSIS — Z515 Encounter for palliative care: Secondary | ICD-10-CM

## 2023-01-31 DIAGNOSIS — E1149 Type 2 diabetes mellitus with other diabetic neurological complication: Secondary | ICD-10-CM

## 2023-01-31 DIAGNOSIS — I255 Ischemic cardiomyopathy: Secondary | ICD-10-CM | POA: Diagnosis not present

## 2023-01-31 LAB — GLUCOSE, CAPILLARY
Glucose-Capillary: 129 mg/dL — ABNORMAL HIGH (ref 70–99)
Glucose-Capillary: 151 mg/dL — ABNORMAL HIGH (ref 70–99)
Glucose-Capillary: 152 mg/dL — ABNORMAL HIGH (ref 70–99)
Glucose-Capillary: 153 mg/dL — ABNORMAL HIGH (ref 70–99)
Glucose-Capillary: 163 mg/dL — ABNORMAL HIGH (ref 70–99)
Glucose-Capillary: 166 mg/dL — ABNORMAL HIGH (ref 70–99)
Glucose-Capillary: 167 mg/dL — ABNORMAL HIGH (ref 70–99)
Glucose-Capillary: 171 mg/dL — ABNORMAL HIGH (ref 70–99)

## 2023-01-31 LAB — CBC WITH DIFFERENTIAL/PLATELET
Abs Immature Granulocytes: 0.16 10*3/uL — ABNORMAL HIGH (ref 0.00–0.07)
Basophils Absolute: 0 10*3/uL (ref 0.0–0.1)
Basophils Relative: 0 %
Eosinophils Absolute: 0.4 10*3/uL (ref 0.0–0.5)
Eosinophils Relative: 3 %
HCT: 31.6 % — ABNORMAL LOW (ref 39.0–52.0)
Hemoglobin: 8.2 g/dL — ABNORMAL LOW (ref 13.0–17.0)
Immature Granulocytes: 1 %
Lymphocytes Relative: 3 %
Lymphs Abs: 0.4 10*3/uL — ABNORMAL LOW (ref 0.7–4.0)
MCH: 20.5 pg — ABNORMAL LOW (ref 26.0–34.0)
MCHC: 25.9 g/dL — ABNORMAL LOW (ref 30.0–36.0)
MCV: 79 fL — ABNORMAL LOW (ref 80.0–100.0)
Monocytes Absolute: 0.3 10*3/uL (ref 0.1–1.0)
Monocytes Relative: 2 %
Neutro Abs: 13 10*3/uL — ABNORMAL HIGH (ref 1.7–7.7)
Neutrophils Relative %: 91 %
Platelets: 315 10*3/uL (ref 150–400)
RBC: 4 MIL/uL — ABNORMAL LOW (ref 4.22–5.81)
RDW: 23.3 % — ABNORMAL HIGH (ref 11.5–15.5)
WBC: 14.2 10*3/uL — ABNORMAL HIGH (ref 4.0–10.5)
nRBC: 0.2 % (ref 0.0–0.2)

## 2023-01-31 LAB — BASIC METABOLIC PANEL
Anion gap: 10 (ref 5–15)
BUN: 57 mg/dL — ABNORMAL HIGH (ref 8–23)
CO2: 24 mmol/L (ref 22–32)
Calcium: 8.7 mg/dL — ABNORMAL LOW (ref 8.9–10.3)
Chloride: 118 mmol/L — ABNORMAL HIGH (ref 98–111)
Creatinine, Ser: 2.19 mg/dL — ABNORMAL HIGH (ref 0.61–1.24)
GFR, Estimated: 32 mL/min — ABNORMAL LOW (ref >60)
Glucose, Bld: 159 mg/dL — ABNORMAL HIGH (ref 70–99)
Potassium: 4.1 mmol/L (ref 3.5–5.1)
Sodium: 152 mmol/L — ABNORMAL HIGH (ref 135–145)

## 2023-01-31 LAB — URIC ACID: Uric Acid, Serum: 19.6 mg/dL — ABNORMAL HIGH (ref 3.7–8.6)

## 2023-01-31 MED ORDER — ALLOPURINOL 100 MG PO TABS
100.0000 mg | ORAL_TABLET | Freq: Every day | ORAL | Status: DC
  Administered 2023-02-03: 100 mg via ORAL
  Filled 2023-01-31 (×2): qty 1

## 2023-01-31 MED ORDER — SODIUM CHLORIDE 0.9 % IV SOLN
3.0000 g | Freq: Three times a day (TID) | INTRAVENOUS | Status: AC
  Administered 2023-01-31 – 2023-02-02 (×5): 3 g via INTRAVENOUS
  Filled 2023-01-31: qty 8
  Filled 2023-01-31 (×2): qty 3
  Filled 2023-01-31 (×6): qty 8

## 2023-01-31 MED ORDER — DEXAMETHASONE SODIUM PHOSPHATE 10 MG/ML IJ SOLN
4.0000 mg | Freq: Two times a day (BID) | INTRAMUSCULAR | Status: DC
  Administered 2023-01-31 – 2023-02-06 (×13): 4 mg via INTRAVENOUS
  Filled 2023-01-31 (×15): qty 1

## 2023-01-31 MED ORDER — BISACODYL 10 MG RE SUPP
10.0000 mg | Freq: Every day | RECTAL | Status: DC | PRN
  Administered 2023-01-31: 10 mg via RECTAL
  Filled 2023-01-31: qty 1

## 2023-01-31 MED ORDER — INSULIN ASPART 100 UNIT/ML IJ SOLN
0.0000 [IU] | INTRAMUSCULAR | Status: DC
  Administered 2023-01-31 – 2023-02-01 (×7): 2 [IU] via SUBCUTANEOUS
  Administered 2023-02-01: 1 [IU] via SUBCUTANEOUS
  Administered 2023-02-02: 2 [IU] via SUBCUTANEOUS
  Administered 2023-02-02 (×2): 1 [IU] via SUBCUTANEOUS
  Administered 2023-02-02 (×2): 2 [IU] via SUBCUTANEOUS
  Administered 2023-02-03: 3 [IU] via SUBCUTANEOUS
  Administered 2023-02-03: 1 [IU] via SUBCUTANEOUS
  Administered 2023-02-03: 2 [IU] via SUBCUTANEOUS
  Administered 2023-02-03 – 2023-02-04 (×5): 3 [IU] via SUBCUTANEOUS
  Administered 2023-02-04: 5 [IU] via SUBCUTANEOUS
  Administered 2023-02-04: 3 [IU] via SUBCUTANEOUS
  Administered 2023-02-04: 2 [IU] via SUBCUTANEOUS
  Administered 2023-02-05 (×5): 3 [IU] via SUBCUTANEOUS
  Administered 2023-02-05: 2 [IU] via SUBCUTANEOUS
  Administered 2023-02-06 (×2): 5 [IU] via SUBCUTANEOUS
  Administered 2023-02-06: 3 [IU] via SUBCUTANEOUS

## 2023-01-31 MED ORDER — DEXTROSE 5 % IV SOLN: INTRAVENOUS | Status: DC

## 2023-01-31 NOTE — Plan of Care (Signed)
Patient remains confused and attempted teaching but patient not replying as to whether he is understanding it or not. Will continue to try to teach.

## 2023-01-31 NOTE — Consult Note (Signed)
Consultation Note Date: 01/31/2023   Patient Name: [name]  DOB: 1955/10/23  MRN: [id number]  Age / Sex: 68 y.o., male  PCP: Benetta Spar, MD Referring Physician: Cleora Fleet, MD  Reason for Consultation: Establishing goals of care  HPI/Patient Profile: 68 y.o. male  with past medical history of chronic combined systolic/diastolic heart failure, HTN/HLD, DM2 with polyneuropathy, polysubstance abuse including cocaine, CAD, ischemic cardiomyopathy, CKD 3, history of LV mural thrombus, OSA,, CAD, ischemic cardiomyopathy, CKD 3, history of LV mural thrombus, OSA, obesity, history of Fournier gangrene, GERD admitted on 01/28/2023 with acute ischemic CVA, small acute infarct in the left insular white matter, meningioma of right middle cranial fossa with edema to be followed up outpatient with neurosurgery.   Clinical Assessment and Goals of Care: I have reviewed medical records including EPIC notes, labs and imaging, received report from RN, assessed the patient.  [name] is lying quietly in bed.  He appears acutely/chronically ill, obese.  He has garbled speech when asked orientation questions.  He clearly cannot make his basic needs known.  There is no family at bedside at this time.  Bedside nursing staff is present attending to needs.  Call to daughter, Dyron Dotzler, to discuss diagnosis prognosis, GOC, EOL wishes, disposition and options.  I introduced Palliative Medicine as specialized medical care for people living with serious illness. It focuses on providing relief from the symptoms and stress of a serious illness. The goal is to improve quality of life for both the patient and the family.  We discussed a brief life review of the patient.  [name] divorced from his first wife, but was with his children's mother when she died last year of a stroke.  She had moved in with a  caregiver, but they were very close and considered themselves to still be partners.  [name] has 2 children, daughter [name] who lives in [place] and a son who is special needs [name] has guardianship).  [name] had been at [facility] under rehab, and he remains to be seen whether he would be able to return to living independently.  We then focused on their current illness.  Overall, [name] is very knowledgeable about her father's acute and chronic health concerns.  She is able to accurately name what is happening.  She asks appropriate questions about his care and future needs including problem solving and planning for future advance.  We talked about time for outcomes.  The natural disease trajectory and expectations at EOL were discussed.  Advanced directives, concepts specific to code status, artifical feeding and hydration, and rehospitalization were considered and discussed.  We talked about the concept of "treat the treatable, but allow a natural passing".  [name] endorses DNR, continue to treat but no CPR or intubation.  Palliative Care services outpatient were explained and offered.  Encouraged to consider this for the future.  Discussed the importance of continued conversation with family and the medical providers regarding overall plan of care and treatment options, ensuring  decisions are within the context of the patient's values and GOCs.  Questions and concerns were addressed.  The family was encouraged to call with questions or concerns.  PMT will continue to support holistically.  Conference with attending, bedside nursing staff, transition of care team related to patient condition, needs, goals of care, disposition.   HCPOA NEXT OF KIN -daughter, Xaivier Beldin.    SUMMARY OF RECOMMENDATIONS   At this point continue to treat the treatable but no CPR or intubation Time for outcomes Anticipate need for short-term rehab, possible long-term care   Code Status/Advance Care  Planning: DNR  Symptom Management:  Per hospitalist, no additional needs at this time.  Palliative Prophylaxis:  Frequent Pain Assessment, Palliative Wound Care, and Turn Reposition  Additional Recommendations (Limitations, Scope, Preferences): Full Scope Treatment  Psycho-social/Spiritual:  Desire for further Chaplaincy support:no Additional Recommendations: Caregiving  Support/Resources  Prognosis:  Unable to determine, based on outcomes.  Discharge Planning: Anticipate short-term rehab, may need long-term care.      Primary Diagnoses: Present on Admission:  Acute metabolic encephalopathy  Hypertensive heart disease  Tobacco use disorder  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  Essential hypertension  Coronary artery disease  Ischemic cardiomyopathy  Cocaine use  PAD (peripheral artery disease) (HCC)  Cocaine abuse (HCC)  Hyperlipidemia, mixed   I have reviewed the medical record, interviewed the patient and family, and examined the patient. The following aspects are pertinent.  Past Medical History:  Diagnosis Date   Bulging lumbar disc    CAD (coronary artery disease) [phone]   a. prior LAD stenting. b. s/p DES to [facility] 08/2015. c. 04/2016 Cardiac cath at [facility]. Patent stent in the PLAD and RCA. Diffuse dLAD, OM2, and  RPDA disease. d. DES to PDA and distal RCA 03/2019    Chronic lower back pain    CKD (chronic kidney disease), stage II    Cocaine abuse (HCC)    Cyst of epididymis    DM2 (diabetes mellitus, type 2) (HCC)    Essential hypertension    Fournier gangrene    GERD (gastroesophageal reflux disease)    Headache    History of pneumonia    Hyperlipidemia    Ischemic cardiomyopathy    LV (left ventricular) mural thrombus    Sleep apnea    Social History   Socioeconomic History   Marital status: Divorced    Spouse name: Not on file   Number of children: 3   Years of education: 12   Highest education level: 12th grade   Occupational History   Not on file  Tobacco Use   Smoking status: Every Day    Current packs/day: 0.50    Average packs/day: 0.5 packs/day for 48.0 years (24.0 ttl pk-yrs)    Types: Cigarettes    Passive exposure: Current   Smokeless tobacco: Never   Tobacco comments:    Smoking Cessation Classes, Agencies, Services & Resources Offered.  Vaping Use   Vaping status: Never Used  Substance and Sexual Activity   Alcohol use: Not Currently    Comment: 11/30/2017 "might drink a beer q 6 months"   Drug use: Not Currently    Types: Cocaine   Sexual activity: Not Currently    Partners: Female  Other Topics Concern   Not on file  Social History Narrative   ** Merged History Encounter **       Social Drivers of Health   Financial Resource Strain: Low Risk  (12/14/2022)  Overall Financial Resource Strain (CARDIA)    Difficulty of Paying Living Expenses: Not hard at all  Food Insecurity: Patient Unable To Answer (01/28/2023)   Hunger Vital Sign    Worried About Running Out of Food in the Last Year: Patient unable to answer    Ran Out of Food in the Last Year: Patient unable to answer  Transportation Needs: Patient Unable To Answer (01/28/2023)   PRAPARE - Transportation    Lack of Transportation (Medical): Patient unable to answer    Lack of Transportation (Non-Medical): Patient unable to answer  Physical Activity: Inactive (12/14/2022)   Exercise Vital Sign    Days of Exercise per Week: 0 days    Minutes of Exercise per Session: 0 min  Stress: Stress Concern Present (12/14/2022)   [employer] of Occupational Health - Occupational Stress Questionnaire    Feeling of Stress : To some extent  Social Connections: Patient Unable To Answer (01/28/2023)   Social Connection and Isolation Panel [NHANES]    Frequency of Communication with Friends and Family: Patient unable to answer    Frequency of Social Gatherings with Friends and Family: Patient unable to answer    Attends Religious  Services: Patient unable to answer    Active Member of Clubs or Organizations: Patient unable to answer    Attends Banker Meetings: Patient unable to answer    Marital Status: Patient unable to answer   Family History  Problem Relation Age of Onset   Hypertension Mother    Diabetes Mother    Scheduled Meds:  [START ON 02/01/2023] allopurinol  100 mg Oral Daily   aspirin  300 mg Rectal Daily   Or   aspirin  81 mg Oral Daily   atorvastatin  40 mg Oral QHS   Chlorhexidine Gluconate Cloth  6 each Topical Daily   Chlorhexidine Gluconate Cloth  6 each Topical Q0600   dexamethasone (DECADRON) injection  4 mg Intravenous Q12H   ferrous sulfate  325 mg Oral Q breakfast   folic acid  1 mg Oral Daily   gabapentin  100 mg Oral QHS   heparin  5,000 Units Subcutaneous Q8H   insulin aspart  0-9 Units Subcutaneous Q4H   ipratropium-albuterol  3 mL Nebulization BID   linaclotide  145 mcg Oral QAC breakfast   mupirocin ointment  1 Application Nasal BID   Continuous Infusions:  ampicillin-sulbactam (UNASYN) IV     dextrose 40 mL/hr at 01/31/23 0810   PRN Meds:.acetaminophen **OR** acetaminophen (TYLENOL) oral liquid 160 mg/5 mL **OR** acetaminophen, HYDROmorphone (DILAUDID) injection, LORazepam, senna-docusate Medications Prior to Admission:  Prior to Admission medications   Medication Sig Start Date End Date Taking? Authorizing Provider  aspirin EC (ASPIRIN LOW DOSE) 81 MG tablet Take 1 tablet (81 mg total) by mouth daily. Swallow whole. 11/14/22  Yes Tat, Onalee Hua, MD  ferrous sulfate 325 (65 FE) MG tablet Take 1 tablet (325 mg total) by mouth daily with breakfast. 11/14/22  Yes Tat, Onalee Hua, MD  folic acid (FOLVITE) 1 MG tablet Take 1 tablet (1 mg total) by mouth daily. 11/14/22  Yes Tat, Onalee Hua, MD  gabapentin (NEURONTIN) 300 MG capsule Take 1 capsule (300 mg total) by mouth 3 (three) times daily. 10/25/22  Yes Vassie Loll, MD  insulin glargine (LANTUS) 100 UNIT/ML Solostar Pen Inject  5 Units into the skin daily. Patient taking differently: Inject 5 Units into the skin at bedtime. 11/15/22  Yes Tat, Onalee Hua, MD  JARDIANCE 10 MG TABS tablet Take 10  mg by mouth daily. 01/02/23  Yes [provider]  linaclotide Karlene Einstein) 145 MCG CAPS capsule Take 1 capsule (145 mcg total) by mouth daily before breakfast. 12/24/22  Yes Tat, Onalee Hua, MD  loperamide (IMODIUM) 2 MG capsule Take 1 capsule (2 mg total) by mouth 4 (four) times daily as needed for diarrhea or loose stools. 01/23/23  Yes Pollina, Canary Brim, MD  midodrine (PROAMATINE) 10 MG tablet Take 1 tablet (10 mg total) by mouth 3 (three) times daily with meals. 12/24/22  Yes Tat, Onalee Hua, MD  ondansetron (ZOFRAN-ODT) 4 MG disintegrating tablet 4mg  ODT q4 hours prn nausea/vomit 01/23/23  Yes Pollina, Canary Brim, MD  pantoprazole (PROTONIX) 40 MG tablet Take 1 tablet (40 mg total) by mouth daily. 10/26/22  Yes Vassie Loll, MD  polyethylene glycol (MIRALAX / GLYCOLAX) 17 g packet Take 17 g by mouth daily as needed for mild constipation. 01/17/23  Yes Vassie Loll, MD  prochlorperazine (COMPAZINE) 10 MG tablet Take 1 tablet (10 mg total) by mouth every 6 (six) hours as needed for nausea or vomiting. 01/21/23  Yes Dione Booze, MD  torsemide (DEMADEX) 20 MG tablet Take 2 tablets (40 mg total) by mouth daily. 01/17/23  Yes Vassie Loll, MD  atorvastatin (LIPITOR) 40 MG tablet Take 1 tablet (40 mg total) by mouth at bedtime. 10/25/22 01/14/23  Vassie Loll, MD  carvedilol (COREG) 3.125 MG tablet Take 1 tablet (3.125 mg total) by mouth 2 (two) times daily with a meal. 10/25/22 01/14/23  Vassie Loll, MD   Allergies  Allergen Reactions   Clopidogrel Other (See Comments)    Drowsy, Skin irritation   Review of Systems  Unable to perform ROS: Acuity of condition    Physical Exam Vitals and nursing note reviewed.  Constitutional:      General: He is not in acute distress.    Appearance: He is obese. He is ill-appearing.   Cardiovascular:     Rate and Rhythm: Normal rate.  Pulmonary:     Effort: Pulmonary effort is normal. No respiratory distress.  Skin:    General: Skin is warm and dry.     Comments: Wounds as noted by nursing   Neurological:     Mental Status: He is alert.     Comments: Garbled speech when asked orientation questions     Vital Signs: BP (!) 139/90   Pulse 88   Temp 97.8 F (36.6 C) (Axillary)   Resp (!) 22   Ht 5\' 9"  (1.753 m)   Wt 95.7 kg   SpO2 100%   BMI 31.16 kg/m  Pain Scale: CPOT POSS *See Group Information*: S-Acceptable,Sleep, easy to arouse Pain Score: Asleep   SpO2: SpO2: 100 % O2 Device:SpO2: 100 % O2 Flow Rate: .O2 Flow Rate (L/min): 3 L/min  IO: Intake/output summary:  Intake/Output Summary (Last 24 hours) at 01/31/2023 1420 Last data filed at 01/31/2023 8295 Gross per 24 hour  Intake 638.1 ml  Output 1400 ml  Net -761.9 ml    LBM: Last BM Date :  (uta) Baseline Weight: Weight: 95.3 kg Most recent weight: Weight: 95.7 kg     Palliative Assessment/Data:     Time In: 1300 Time Out: 1415 Time Total: 75 minutes  Greater than 50%  of this time was spent counseling and coordinating care related to the above assessment and plan.  Signed by: Katheran Awe, NP   Please contact Palliative Medicine Team phone at [phone] for questions and concerns.  For individual provider: See Loretha Stapler

## 2023-01-31 NOTE — Progress Notes (Signed)
Patient alert to self only. Yells out/moans especially when touching bilateral legs. Patient is now able to raise both arms on own and seen slightly moving both legs. Dr Laural Benes updated on patient status and refusal to take PO pills, food, drink and takes his nasal cannula O2 off face/out of nose at times. Patient does swing arms at staff at times. Verbal calming performed with some positive effect. O2 sats ok on room air but does dip into the 80's briefly when sleeping before coming back up in the 90's when on room air. Dr Laural Benes aware of patient being noncompliant. PRN pain meds given to assist with patient pain via CPOT assessment. Dr Laural Benes aware. Bladder scans performed and output measured via malewick and Dr Laural Benes aware of results. Dr Laural Benes made aware of no BM documented so PRN suppository ordered and given this evening. No BM result yet and Dr Laural Benes updated on it. Report given to night nurse to monitor for any BM.

## 2023-01-31 NOTE — Progress Notes (Signed)
SLP Cancellation Note  Patient Details Name: AAVIN TUOMI MRN: [id number] DOB: 09-26-1955   Cancelled treatment:       Reason Eval/Treat Not Completed: Patient declined, no reason specified;Other (comment) (Pt adamantly refused all PO trials despite offering ice chips, soda, and ice cream. Recommend offering ice chips and small sips of water if Pt willing. SLP will continue to check back.)  Thank you,  Havery Moros, CCC-SLP [phone]  Warren Kugelman 01/31/2023, 5:14 PM

## 2023-01-31 NOTE — Procedures (Signed)
Patient Name: [name]  MRN: [id number]  Epilepsy Attending: Charlsie Quest  Referring Physician/Provider: Cleora Fleet, MD  Date: 01/30/2023 Duration: 22.52 mins  Patient history: 68yo M with ams getting eeg to evaluate for seizure  Level of alertness: Awake  AEDs during EEG study: GBP  Technical aspects: This EEG study was done with scalp electrodes positioned according to the 10-20 International system of electrode placement. Electrical activity was reviewed with band pass filter of 1-70Hz , sensitivity of 7 uV/mm, display speed of 33mm/sec with a 60Hz  notched filter applied as appropriate. EEG data were recorded continuously and digitally stored.  Video monitoring was available and reviewed as appropriate.  Description: EEG showed continuous generalized polymorphic 3 to 6 Hz theta-delta slowing. Hyperventilation and photic stimulation were not performed.     ABNORMALITY - Continuous slow, generalized  IMPRESSION: This study is suggestive of moderate diffuse encephalopathy. No seizures or epileptiform discharges were seen throughout the recording.  [name]

## 2023-01-31 NOTE — Progress Notes (Signed)
Patient is restless and hollering out. Too altered for BIPAP machine. ABG results were normal patient is just encephalopathic.

## 2023-01-31 NOTE — Progress Notes (Signed)
PROGRESS NOTE   [name]  WUJ:[id number] DOB: Dec 02, 1955 DOA: 01/28/2023 PCP: Benetta Spar, MD   Chief Complaint  Patient presents with   Altered Mental Status   Level of care: Stepdown  Brief Admission History:  68 year old male with chronic combined systolic and diastolic heart failure, hyperlipidemia, type 2 diabetes mellitus, diabetic polyneuropathy, hypertension, polysubstance abuse, stage III CKD history of cocaine abuse, failure to thrive, coronary artery disease, ischemic cardiomyopathy, history of LV mural thrombus, OSA, history of Fournier gangrene, GERD, who was recently hospitalized for acute heart failure and discharged to [facility].  He was sent to the ED by EMS for lethargic and confusion since about 7 PM that evening.  He reportedly has normally been alert and oriented.  He was not answering questions.  Stroke was suspected and he was seen by teleneurologist.  TNK was not given as he was outside the window.  Neurology recommended MRI brain and routine stroke workup.  They recommended aspirin 325 mg daily.   Assessment and Plan:  Acute ischemic CVA  - MRI brain positive for small acute infarct in the left insular white matter - ASA 325 mg daily ordered - encephalopathy worsened this morning and was transferred to stepdown ICU for bipap - reached out to neurology on call for further recommendations - neuro checks ordered - check A1c-7.2%, LDL - 29 on statin - TTE with bubble study ordered by neuro 1/20 pending - IV fluids ordered - NPO per SLP - discussed with neurology, EEG no epileptic activity seen; c/w encephalopathy - called out to neurosurgery on call regarding meningioma 1/20 - agree with neurologist that palliative care consult needed - updated daughter by telephone   Meningioma in the right middle cranial fossa with edema in the adjacent temporal lobe - given his persistent encephalopathy, I discussed with neurosurgeon Dr. Danielle Dess on  1/20 - he said we could do a trial of dexamethasone to see if patient improves.  He recommended 4 mg BID.  If patient improves on this, he said to discharge patient on oral decadron and follow up with him in the office to discuss surgical removal of the meningioma.    Acute respiratory failure with hypoxia Aspiration pneumonia - IV ampicillin sulbactam ordered after blood cultures - BIPAP therapy ordered and transferred to stepdown ICU on 1/19 - scheduled bronchodilators Duonebs Q4h - continue bipap QHS   Acute respiratory acidosis - management as above with bipap therapy  - mentation improved with bipap therapy - repeated ABG reassuring    AKI on CKD stage 3b - creatinine remains elevated - he is likely dry from diuretics and poor oral intake - IV fluid hydration ordered (gentle given cardiomyopathy) - renally dose medications - follow BMP daily    Type 2 DM uncontrolled with vascular complications - A1c 7.8% - SSI coverage for now with frequent CBG testing  CBG (last 3)  Recent Labs    01/31/23 0006 01/31/23 0502 01/31/23 0737  GLUCAP 129* 163* 153*   History of polysubstance abuse - he is currently residing in a SNF - UDS negative    Leukocytosis  - suspect secondary to aspiration pneumonia - treating as above - follow CBC with diff    Normocytic Anemia - follow CBC closely  - transfuse for Hg<7 - continue daily iron supplement     Hyperlipidemia - check fasting lipid panel - resume home atorvastatin daily 40 mg   Essential Hypertension  - allowing permissive hypertension in setting of acute CVA  per neuro recs   Ischemic cardiomyopathy  - recent TTE 12/24 LVEF 30-35%  - TTE with bubble study 1/20: LVEF now 25-30%, no LV thrombus, neg bubble study;  - holding home torsemide 40 mg temporarily   Hypernatremia - he is not eating or drinking well - he did poorly on swallow evaluation - added more free water to IV fluid  - recheck in AM  - palliative  consult for goals of care requested    DVT prophylaxis: sq heparin  Code Status: Full  Family Communication: daughter 1/18, 1/19, 1/20, 1/21 Disposition: anticipate SNF rehab    Consultants:  Neurology   Procedures:    Antimicrobials:    Subjective: He seems less encephalopathic today since starting steroids yesterday.  Not yelling out in pain.  His left hemiparesis is improving.  He is refusing to eat/drink or take medications orally.   Objective: Vitals:   01/31/23 0803 01/31/23 0900 01/31/23 1000 01/31/23 1100  BP:  139/85 128/85 131/87  Pulse:  90 87 88  Resp:  15 11 20   Temp: 97.6 F (36.4 C)     TempSrc: Axillary     SpO2:  99% 91% 97%  Weight:      Height:        Intake/Output Summary (Last 24 hours) at 01/31/2023 1117 Last data filed at 01/31/2023 4098 Gross per 24 hour  Intake 638.1 ml  Output 1400 ml  Net -761.9 ml   Filed Weights   01/29/23 0357 01/30/23 0416 01/31/23 0702  Weight: 99.1 kg 97.3 kg 95.7 kg   Examination:  General exam: Appears calm and comfortable on [place].  Slurring speech and drooling improving.   Respiratory system:  rales heard on left side, no increased WOB. Cardiovascular system: normal S1 & S2 heard. No JVD, murmurs, rubs, gallops or clicks. 1+ pedal edema. Gastrointestinal system: Abdomen is nondistended, soft and nontender. No organomegaly or masses felt. Normal bowel sounds heard. Central nervous system: Alert. Slightly improved Left hemiparesis, was able to move left arm. Extremities: feet severely painful. Skin: No rashes, lesions or ulcers. Psychiatry: Judgement and insight appear UTD. Mood & affect UTD.    Data Reviewed: I have personally reviewed following labs and imaging studies  CBC: Recent Labs  Lab 01/28/23 0214 01/29/23 0451 01/30/23 0429 01/31/23 0413  WBC 15.7* 24.1* 15.7* 14.2*  NEUTROABS 13.2* 21.8* 13.5* 13.0*  HGB 8.2* 8.0* 8.0* 8.2*  HCT 31.3* 30.2* 29.4* 31.6*  MCV 80.7 79.3* 77.6* 79.0*  PLT 321  359 340 315    Basic Metabolic Panel: Recent Labs  Lab 01/28/23 0214 01/29/23 0451 01/30/23 0429 01/31/23 0413  NA 137 141 144 152*  K 4.2 4.0 3.9 4.1  CL 103 108 110 118*  CO2 24 23 23 24   GLUCOSE 111* 117* 147* 159*  BUN 62* 67* 71* 57*  CREATININE 3.03* 2.97* 2.80* 2.19*  CALCIUM 8.4* 8.3* 8.6* 8.7*  MG  --  2.4  --   --     CBG: Recent Labs  Lab 01/30/23 1623 01/30/23 2205 01/31/23 0006 01/31/23 0502 01/31/23 0737  GLUCAP 87 102* 129* 163* 153*    Recent Results (from the past 240 hours)  Culture, blood (Routine X 2) w Reflex to ID Panel     Status: None (Preliminary result)   Collection Time: 01/28/23  9:39 AM   Specimen: BLOOD  Result Value Ref Range Status   Specimen Description BLOOD BLOOD RIGHT ARM  Final   Special Requests   Final  BOTTLES DRAWN AEROBIC AND ANAEROBIC Blood Culture adequate volume   Culture   Final    NO GROWTH 3 DAYS Performed at [facility], [address] [place], [place] [address]    Report Status PENDING  Incomplete  Culture, blood (Routine X 2) w Reflex to ID Panel     Status: None (Preliminary result)   Collection Time: 01/28/23  9:46 AM   Specimen: BLOOD  Result Value Ref Range Status   Specimen Description BLOOD LEFT ANTECUBITAL  Final   Special Requests   Final    BOTTLES DRAWN AEROBIC AND ANAEROBIC Blood Culture adequate volume   Culture   Final    NO GROWTH 3 DAYS Performed at [facility], [address], [place], [place] [address]    Report Status PENDING  Incomplete  MRSA Next Gen by PCR, Nasal     Status: Abnormal   Collection Time: 01/28/23 10:42 AM   Specimen: Nasal Mucosa; Nasal Swab  Result Value Ref Range Status   MRSA by PCR Next Gen DETECTED (A) NOT DETECTED Final    Comment: RESULT CALLED TO, READ BACK BY AND VERIFIED WITH: IRVING,L ON 01/28/23 AT 2125 BY PURDIE,J        The GeneXpert MRSA Assay (FDA approved for NASAL specimens only), is one component of a comprehensive MRSA  colonization surveillance program. It is not intended to diagnose MRSA infection nor to guide or monitor treatment for MRSA infections. Performed at [facility], [address] [place], [place] [address]      Radiology Studies: EEG adult Result Date: 01/31/2023 Charlsie Quest, MD     01/31/2023  8:39 AM Patient Name: [name] MRN: [id number] Epilepsy Attending: Charlsie Quest Referring Physician/Provider: Cleora Fleet, MD Date: 01/30/2023 Duration: 22.52 mins Patient history: 68yo M with ams getting eeg to evaluate for seizure Level of alertness: Awake AEDs during EEG study: GBP Technical aspects: This EEG study was done with scalp electrodes positioned according to the 10-20 International system of electrode placement. Electrical activity was reviewed with band pass filter of 1-70Hz , sensitivity of 7 uV/mm, display speed of 25mm/sec with a 60Hz  notched filter applied as appropriate. EEG data were recorded continuously and digitally stored.  Video monitoring was available and reviewed as appropriate. Description: EEG showed continuous generalized polymorphic 3 to 6 Hz theta-delta slowing. Hyperventilation and photic stimulation were not performed.   ABNORMALITY - Continuous slow, generalized IMPRESSION: This study is suggestive of moderate diffuse encephalopathy. No seizures or epileptiform discharges were seen throughout the recording. Charlsie Quest   ECHOCARDIOGRAM COMPLETE BUBBLE STUDY Result Date: 01/30/2023    ECHOCARDIOGRAM REPORT   Patient Name:   [name] Date of Exam: 01/30/2023 Medical Rec #:  [id number]        Height:       69.0 in Accession #:    [id number]       Weight:       214.5 lb Date of Birth:  07-29-1955        BSA:          2.128 m Patient Age:    67 years         BP:           95/57 mmHg Patient Gender: M                HR:           91 bpm. [facility] Procedure: 2D Echo, Cardiac Doppler,  Color Doppler, Saline Contrast Bubble Study             and Intracardiac Opacification Agent Indications:    Stroke  History:        Patient has prior history of Echocardiogram examinations, most                 recent 12/22/2022. Cardiomyopathy and CHF, CAD and Previous                 Myocardial Infarction; Risk Factors:Hypertension, Current                 Smoker, Dyslipidemia, Sleep Apnea and Diabetes. Cocaine use.                 Opiate overdose. LV thrombus.  Sonographer:    Sheralyn Boatman RDCS Referring Phys: [id number] [facility] Annabelle Harman  Sonographer Comments: Technically difficult study due to poor echo windows. Image acquisition challenging due to uncooperative patient. Patient moaning moving and screaming throughout exam. IMPRESSIONS  1. No evidence of LV thrombus. Left ventricular ejection fraction, by estimation, is 25 to 30%. The left ventricle has severely decreased function. The left ventricle demonstrates regional wall motion abnormalities (see scoring diagram/findings for description). The left ventricular internal cavity size was mildly dilated. Left ventricular diastolic parameters are indeterminate. Elevated left atrial pressure.  2. Right ventricular systolic function is moderately reduced. The right ventricular size is normal. Tricuspid regurgitation signal is inadequate for assessing PA pressure.  3. The mitral valve is normal in structure. Trivial mitral valve regurgitation. No evidence of mitral stenosis.  4. The aortic valve was not well visualized. Aortic valve regurgitation is not visualized. No aortic stenosis is present.  5. The inferior vena cava is normal in size with greater than 50% respiratory variability, suggesting right atrial pressure of 3 mmHg.  6. Agitated saline contrast bubble study was negative, with no evidence of any interatrial shunt. Comparison(s): No significant change from prior study. FINDINGS  Left Ventricle: No evidence of LV thrombus. Left ventricular ejection fraction, by estimation, is 25 to 30%. The left ventricle has  severely decreased function. The left ventricle demonstrates regional wall motion abnormalities. Definity contrast agent was given IV to delineate the left ventricular endocardial borders. The left ventricular internal cavity size was mildly dilated. There is no left ventricular hypertrophy. Left ventricular diastolic parameters are indeterminate. Elevated left atrial pressure.  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 The entire inferior wall, apical lateral segment, apical septal segment, and apical anterior segment are akinetic. The anterior wall, antero-lateral wall, anterior septum, mid inferoseptal segment, and basal inferoseptal segment are hypokinetic. Right Ventricle: The right ventricular size is normal. No increase in right ventricular wall thickness. Right ventricular systolic function is moderately reduced. Tricuspid regurgitation signal is inadequate for assessing PA pressure. Left Atrium: Left atrial size was normal in size. Right Atrium: Right atrial size was normal in size. Pericardium: There is no evidence of pericardial effusion. Mitral Valve: The mitral valve is normal in structure. Trivial mitral valve regurgitation. No evidence of mitral valve stenosis. Tricuspid Valve: The tricuspid valve is normal in structure. Tricuspid valve regurgitation is trivial. No evidence of tricuspid stenosis. Aortic Valve: The aortic valve was not well visualized. Aortic valve regurgitation is not visualized. No aortic stenosis is present. Pulmonic Valve: The pulmonic valve was not well visualized. Pulmonic valve regurgitation is not visualized. No evidence of pulmonic stenosis. Aorta: The aortic root and ascending aorta are structurally normal, with no evidence of dilitation.  Venous: The inferior vena cava is normal in size with greater than 50% respiratory variability, suggesting right atrial pressure of 3 mmHg. IAS/Shunts: The interatrial septum was not well visualized. Agitated saline contrast was given  intravenously to evaluate for intracardiac shunting. Agitated saline contrast bubble study was negative, with no evidence of any interatrial shunt.  LEFT VENTRICLE PLAX 2D LVIDd:         5.90 cm      Diastology LVIDs:         4.70 cm      LV e' medial:    2.61 cm/s LV PW:         1.30 cm      LV E/e' medial:  43.7 LV IVS:        1.20 cm      LV e' lateral:   5.44 cm/s LVOT diam:     2.10 cm      LV E/e' lateral: 21.0 LV SV:         68 LV SV Index:   32 LVOT Area:     3.46 cm  LV Volumes (MOD) LV vol d, MOD A2C: 181.0 ml LV vol d, MOD A4C: 149.0 ml LV vol s, MOD A2C: 132.0 ml LV vol s, MOD A4C: 118.0 ml LV SV MOD A2C:     49.0 ml LV SV MOD A4C:     149.0 ml LV SV MOD BP:      40.1 ml RIGHT VENTRICLE             IVC RV S prime:     12.00 cm/s  IVC diam: 2.70 cm TAPSE (M-mode): 1.2 cm LEFT ATRIUM             Index        RIGHT ATRIUM           Index LA diam:        4.80 cm 2.26 cm/m   RA Area:     18.40 cm LA Vol (A2C):   55.4 ml 26.03 ml/m  RA Volume:   51.10 ml  24.01 ml/m LA Vol (A4C):   53.3 ml 25.04 ml/m LA Biplane Vol: 55.8 ml 26.22 ml/m  AORTIC VALVE LVOT Vmax:   118.00 cm/s LVOT Vmean:  77.800 cm/s LVOT VTI:    0.196 m  AORTA Ao Root diam: 3.60 cm Ao Asc diam:  2.80 cm MITRAL VALVE MV Area (PHT): 4.89 cm     SHUNTS MV Decel Time: 155 msec     Systemic VTI:  0.20 m MV E velocity: 114.00 cm/s  Systemic Diam: 2.10 cm Vishnu Priya Mallipeddi Electronically signed by Winfield Rast Mallipeddi Signature Date/Time: 01/30/2023/5:02:11 PM    Final    DG CHEST PORT 1 VIEW Result Date: 01/30/2023 CLINICAL DATA:  Aspiration pneumonia. EXAM: PORTABLE CHEST 1 VIEW COMPARISON:  Chest x-ray dated January 28, 2023. FINDINGS: Unchanged cardiomegaly. New focal opacity in the right upper lobe. Unchanged linear scarring/atelectasis at the right lung base. No pneumothorax or pleural effusion. No acute osseous abnormality. IMPRESSION: 1. New focal opacity in the right upper lobe, concerning for pneumonia. Electronically  Signed   By: Obie Dredge M.D.   On: 01/30/2023 10:03    Scheduled Meds:  aspirin  300 mg Rectal Daily   Or   aspirin  81 mg Oral Daily   atorvastatin  40 mg Oral QHS   Chlorhexidine Gluconate Cloth  6 each Topical Daily   Chlorhexidine Gluconate Cloth  6 each Topical  Q0600   dexamethasone (DECADRON) injection  4 mg Intravenous Q12H   ferrous sulfate  325 mg Oral Q breakfast   folic acid  1 mg Oral Daily   gabapentin  100 mg Oral QHS   heparin  5,000 Units Subcutaneous Q8H   insulin aspart  0-9 Units Subcutaneous Q4H   ipratropium-albuterol  3 mL Nebulization BID   linaclotide  145 mcg Oral QAC breakfast   mupirocin ointment  1 Application Nasal BID   Continuous Infusions:  ampicillin-sulbactam (UNASYN) IV 3 g (01/31/23 1038)   dextrose 40 mL/hr at 01/31/23 0810     LOS: 3 days   Critical Care Procedure Note Authorized and Performed by: Maryln Manuel MD  Total Critical Care time: 58 mins Due to a high probability of clinically significant, life threatening deterioration, the patient required my highest level of preparedness to intervene emergently and I personally spent this critical care time directly and personally managing the patient.  This critical care time included obtaining a history; examining the patient, pulse oximetry; ordering and review of studies; arranging urgent treatment with development of a management plan; evaluation of patient's response of treatment; frequent reassessment; and discussions with other providers.  This critical care time was performed to assess and manage the high probability of imminent and life threatening deterioration that could result in multi-organ failure.  It was exclusive of separately billable procedures and treating other patients and teaching time.    Standley Dakins, MD How to contact the [facility] Attending or Consulting provider 7A - 7P or covering provider during after hours 7P -7A, for this patient?  Check the care team in [facility] and  look for a) attending/consulting [facility] provider listed and b) the [facility] team listed Log into [email or web address] to find provider on call.  Locate the [facility] provider you are looking for under [facility] and page to a number that you can be directly reached. If you still have difficulty reaching the provider, please page the [facility] (Director on Call) for the Hospitalists listed on amion for assistance.  01/31/2023, 11:17 AM

## 2023-01-31 NOTE — Plan of Care (Signed)
  Problem: Education: Goal: Knowledge of General Education information will improve Description: Including pain rating scale, medication(s)/side effects and non-pharmacologic comfort measures Outcome: Not Progressing   Problem: Nutrition: Goal: Adequate nutrition will be maintained Outcome: Not Progressing   Problem: Education: Goal: Ability to describe self-care measures that may prevent or decrease complications (Diabetes Survival Skills Education) will improve Outcome: Not Progressing Goal: Individualized Educational Video(s) Outcome: Not Progressing   Patient remains with altered mental status

## 2023-02-01 ENCOUNTER — Ambulatory Visit: Payer: Self-pay | Admitting: *Deleted

## 2023-02-01 DIAGNOSIS — M1000 Idiopathic gout, unspecified site: Secondary | ICD-10-CM | POA: Diagnosis not present

## 2023-02-01 DIAGNOSIS — I5020 Unspecified systolic (congestive) heart failure: Secondary | ICD-10-CM | POA: Diagnosis not present

## 2023-02-01 DIAGNOSIS — Z515 Encounter for palliative care: Secondary | ICD-10-CM | POA: Diagnosis not present

## 2023-02-01 DIAGNOSIS — I255 Ischemic cardiomyopathy: Secondary | ICD-10-CM | POA: Diagnosis not present

## 2023-02-01 DIAGNOSIS — I639 Cerebral infarction, unspecified: Secondary | ICD-10-CM | POA: Diagnosis not present

## 2023-02-01 DIAGNOSIS — Z7189 Other specified counseling: Secondary | ICD-10-CM | POA: Diagnosis not present

## 2023-02-01 DIAGNOSIS — E1149 Type 2 diabetes mellitus with other diabetic neurological complication: Secondary | ICD-10-CM | POA: Diagnosis not present

## 2023-02-01 LAB — CBC WITH DIFFERENTIAL/PLATELET
Abs Immature Granulocytes: 0.11 10*3/uL — ABNORMAL HIGH (ref 0.00–0.07)
Basophils Absolute: 0 10*3/uL (ref 0.0–0.1)
Basophils Relative: 0 %
Eosinophils Absolute: 0 10*3/uL (ref 0.0–0.5)
Eosinophils Relative: 0 %
HCT: 32 % — ABNORMAL LOW (ref 39.0–52.0)
Hemoglobin: 8.5 g/dL — ABNORMAL LOW (ref 13.0–17.0)
Immature Granulocytes: 1 %
Lymphocytes Relative: 4 %
Lymphs Abs: 0.4 10*3/uL — ABNORMAL LOW (ref 0.7–4.0)
MCH: 21 pg — ABNORMAL LOW (ref 26.0–34.0)
MCHC: 26.6 g/dL — ABNORMAL LOW (ref 30.0–36.0)
MCV: 79 fL — ABNORMAL LOW (ref 80.0–100.0)
Monocytes Absolute: 0.4 10*3/uL (ref 0.1–1.0)
Monocytes Relative: 3 %
Neutro Abs: 10.4 10*3/uL — ABNORMAL HIGH (ref 1.7–7.7)
Neutrophils Relative %: 92 %
Platelets: 322 10*3/uL (ref 150–400)
RBC: 4.05 MIL/uL — ABNORMAL LOW (ref 4.22–5.81)
RDW: 23.5 % — ABNORMAL HIGH (ref 11.5–15.5)
Smear Review: ADEQUATE
WBC: 11.4 10*3/uL — ABNORMAL HIGH (ref 4.0–10.5)
nRBC: 1 % — ABNORMAL HIGH (ref 0.0–0.2)

## 2023-02-01 LAB — GLUCOSE, CAPILLARY
Glucose-Capillary: 152 mg/dL — ABNORMAL HIGH (ref 70–99)
Glucose-Capillary: 161 mg/dL — ABNORMAL HIGH (ref 70–99)
Glucose-Capillary: 158 mg/dL — ABNORMAL HIGH (ref 70–99)
Glucose-Capillary: 157 mg/dL — ABNORMAL HIGH (ref 70–99)
Glucose-Capillary: 146 mg/dL — ABNORMAL HIGH (ref 70–99)

## 2023-02-01 LAB — BASIC METABOLIC PANEL
Anion gap: 8 (ref 5–15)
BUN: 46 mg/dL — ABNORMAL HIGH (ref 8–23)
CO2: 26 mmol/L (ref 22–32)
Calcium: 9.2 mg/dL (ref 8.9–10.3)
Chloride: 120 mmol/L — ABNORMAL HIGH (ref 98–111)
Creatinine, Ser: 1.73 mg/dL — ABNORMAL HIGH (ref 0.61–1.24)
GFR, Estimated: 42 mL/min — ABNORMAL LOW (ref >60)
Glucose, Bld: 192 mg/dL — ABNORMAL HIGH (ref 70–99)
Potassium: 4.1 mmol/L (ref 3.5–5.1)
Sodium: 154 mmol/L — ABNORMAL HIGH (ref 135–145)

## 2023-02-01 MED ORDER — SODIUM CHLORIDE 0.45 % IV SOLN: INTRAVENOUS | Status: DC

## 2023-02-01 MED ORDER — IPRATROPIUM-ALBUTEROL 0.5-2.5 (3) MG/3ML IN SOLN: 3.0000 mL | Freq: Four times a day (QID) | RESPIRATORY_TRACT | Status: DC | PRN

## 2023-02-01 NOTE — Patient Outreach (Signed)
Care Coordination   Follow Up Visit Note   02/01/2023  Name: [name] MRN: [id number] DOB: 1955-12-17  [name] is a 68 y.o. year old male who sees [name], [name], MD for primary care. I spoke with patient's daughter, [name] by phone today.  What matters to the patients health and wellness today?  Find Help in My Community.    Goals Addressed               This Visit's Progress     Find Help in My Community. (pt-stated)   On track     Care Coordination Interventions:   Interventions Today    Flowsheet Row Most Recent Value  Chronic Disease   Chronic disease during today's visit Congestive Heart Failure (CHF), Hypertension (HTN), Diabetes, Chronic Kidney Disease/End Stage Renal Disease (ESRD), Other  [Meningioma w/ Encephalopathy & Cerebral Edema, Cocaine Abuse, Tobacco Abuse, Inability to Perform Activities of Daily Living Independently, Caregiver Burnout/Stress/Fatigue, Chronic Pain, Pleural Effusion, Re-Hospitalized, Daughter Requesting Placement.]  General Interventions   General Interventions Discussed/Reviewed General Interventions Discussed, Labs, Vaccines, Doctor Visits, Health Screening, Annual Foot Exam, Lipid Profile, General Interventions Reviewed, Annual Eye Exam, Durable Medical Equipment (DME), Community Resources, Level of Care, Communication with  [Encouraged Routine Engagement with Care Team Members & Providers.]  Labs Hgb A1c every 3 months, Kidney Function  [Encouraged Routine Labwork.]  Vaccines COVID-19, Flu, Pneumonia, RSV, Shingles, Tetanus/Pertussis/Diphtheria  [Encouraged Routine Vaccinations.]  Doctor Visits Discussed/Reviewed Doctor Visits Discussed, Specialist, Doctor Visits Reviewed, Annual Wellness Visits, PCP  [Encouraged Routine Engagement with Care Team Members & Providers.]  Health Screening Bone Density, Colonoscopy, Prostate  [Encouraged Routine Health Screenings.]  Durable Medical Equipment (DME) Dan Humphreys,  Wheelchair, Research scientist (medical) & Daughter Scientist, forensic with Back, Glucometer or Continuous Glucose Monitor, Blood Pressure Monitor & Scales. CSW Will Request Order from Primary Care Provider.]  Wheelchair Standard  PCP/Specialist Visits Compliance with follow-up visit  [Encouraged Routine Engagement with Care Team Members & Providers.]  Communication with PCP/Specialists, RN, Pharmacists, Social Work  [employer] Routine Engagement with Care Team Members & Providers.]  Level of Care Adult Daycare, Air traffic controller, Assisted Living, Skilled Nursing Facility  [Two Weeks Ago, Patient Did Not Qualify for Gaffer at Skilled Nursing Facility Upon Discharge from Hospital, Returning Home with Home Health Services through [facility].]  Applications Medicaid, [place]-2, Other  [Now That Patient Has Been Re-Hospitalized, Daughter is Wanting to [employer] Level of Care Options (I.e Skilled Secondary school teacher for Gaffer or Long-Term Memory Care Assisted Living Facility Placement).]  Exercise Interventions   Exercise Discussed/Reviewed Exercise Discussed, Assistive device use and maintanence, Exercise Reviewed, Physical Activity, Weight Managment  [Patient Encouraged to Work with Physical Therapist & Acupuncturist, While Hospitalized.]  Physical Activity Discussed/Reviewed Home Exercise Program (HEP), Physical Activity Discussed, PREP, Physical Activity Reviewed, Gym, Types of exercise  [Encouraged Increased Level of Activity & Exercise, as Tolerated.]  Weight Management Weight loss  [Encouraged Healthy [employer] Loss Program.]  Education Interventions   Education Provided Provided Therapist, sports, Provided Web-based Education, Provided Education  [employer] Daughter: Merchandiser, retail Long-Term Care Medicaid Application, Passenger transport manager, Aide & Attendance Benefits Information & Application, Aging,  Disability & Transit Engineer, technical sales.]  Provided Verbal Education On Nutrition, Mental Health/Coping with Illness, When to see the doctor, Foot Care, Eye Care, Labs, Applications, Blood Sugar Monitoring, Exercise, Medication, [employer], [employer]  [Thoroughly Reviewed to Ensure Understanding & Entertain Questions.]  Labs Reviewed Hgb A1c  [Reviewed.]  Applications Medicaid, [place]-2, Other  [Now That Patient Has Been Re-Hospitalized, Daughter is Wanting to [employer] Level of Care Options (I.e Skilled Nursing Facility Placement for Short-Term Rehabilitative Services or Long-Term Memory Care Assisted Living Facility Placement).]  Mental Health Interventions   Mental Health Discussed/Reviewed Mental Health Discussed, Anxiety, Mental Health Reviewed, Depression, Grief and Loss, Coping Strategies, Substance Abuse, Suicide, Crisis, Other  [Assessed Mental Health & Cognitive Status.]  Nutrition Interventions   Nutrition Discussed/Reviewed Nutrition Discussed, Adding fruits and vegetables, Increasing proteins, Nutrition Reviewed, Decreasing fats, Fluid intake, Carbohydrate meal planning, Portion sizes, Decreasing salt, Decreasing sugar intake  [Encouraged Heart-Healthy, Diabetic-Friendly, Low Sodium, Renal-Friendly, Reduced Fat Diet.]  Pharmacy Interventions   Pharmacy Dicussed/Reviewed Pharmacy Topics Discussed, Medications and their functions, Medication Adherence, Pharmacy Topics Reviewed, Affording Medications  [Confirmed Ability to Afford Prescription Medications.]  Medication Adherence --  [Confirmed Prescription Medication Compliance.]  Safety Interventions   Safety Discussed/Reviewed Safety Discussed, Safety Reviewed, Fall Risk, Home Safety  [Encouraged Routine Use of Assistive Devices & Durable Medical Equipment.]  Home Safety Assistive Devices, Need for home safety assessment, Refer for community resources, Refer for home visit, Contact provider for  referral to PT/OT, Contact home health agency  [Encouraged Consideration of Gaffer in Skilled Nursing Facility, Upon Discharge from Hospital.]  Advanced Directive Interventions   Advanced Directives Discussed/Reviewed Advanced Directives Discussed, Advanced Directives Reviewed, Advanced Care Planning, Guardianship  [Encouraged Initiation of Advanced Directives (Living Will & Healthcare Power of Corporate treasurer), Offering to [employer], Assist with Completion, Make Copies & Scan into Electronic Medical Record in Epic.]  Guardianship Provide resources, Refer to an agency  [Provided Daughter with Information on Filing for Guardianship at [employer] Process for Becoming Patient's Legal Guardian.]      Active Listening & Reflection Utilized. Verbalization of Feelings Encouraged. Emotional Support Provided. CSW Collaboration with Daughter, Brallan Drath to Address Patient's Hospital Admission & Confirm Desire for Patient to Return to [facility] [phone]) to Resume Short-Term Rehabilitative Services, Upon Medical Clearance & Discharge from Hospital.  CSW Collaboration with Daughter, Srikar Consolo to [employer] of Interest in [place] [facility], from List Provided:  ~ Assisted Living Facilities in [place] & [place].  ~ Extended Care Facilities in [place] & [place]. ~ Memory Care Assisted Living Facilities in [place] & [place]. ~ Skilled Nursing Facilities in [place] & [place].  CSW Collaboration with Daughter, Duron Huckleby to Encourage Initiation of Applications of Interest & Outreach to [employer] of Interest, from List Provided:   ~ How to Apply for Aid & Attendance Benefits - [employer]. ~ Aid & Attendance Radio producer - Merchandiser, retail.  ~ Associate Professor to [employer] Personal Information - Scientist, product/process development.  ~ Advice worker - Merchandiser, retail.  ~ Public librarian in Building surveyor of Claim - Merchandiser, retail. ~ Personal assistant. ~ Step-By-Step Instructions on How to Apply for Health Net. ~ 2024 Medicaid Income Guidelines & Reserve Licensed conveyancer. ~ Making Medicaid Accessible Through [employer]. ~ Aging, Disability & Transit Services of [employer].  ~ [employer]. ~ Quarry manager in [place].  ~ Designer, industrial/product (Living Will Microbiologist).  ~ Instructions on How to File for Legal Guardianship. ~ Guardianship Application. ~ Cyd Silence Brochure. CSW Collaboration with Daughter, Verley Laudon to Encourage Routine Engagement with  Danford Bad, Licensed Clinical Social Worker with [facility] [phone]), if She Has Questions, Needs Assistance, or If Additional Social Work Needs Are Identified Between Now & Our Next Follow-Up Outreach Call, Scheduled on 02/14/2023 at 12:00 PM.      SDOH assessments and interventions completed:  Yes.  Care Coordination Interventions:  Yes, provided.   Follow up plan: Follow up call scheduled for 02/14/2023 at 12:00 pm.  Encounter Outcome:  Patient Visit Completed.    Danford Bad, BSW, MSW, LCSW [place], [facility] Clinical Social Worker II Direct Dial: [phone]  Fax: [phone] Website: [email or web address]

## 2023-02-01 NOTE — Progress Notes (Signed)
Patient is still in mittens and altered/combative mental status. BIPAP at night is contraindicated. No unit in room.

## 2023-02-01 NOTE — Progress Notes (Signed)
Palliative: Mr. Yale is lying quietly in bed.  He appears acutely/chronically ill and quite frail.  He is unable to communicate his basic needs.  There is no family at bedside at this time. Face-to-face discussion with attending related to patient condition, needs, goals of care.  Call to daughter, Oriel Klaus.  We talk about Mr. Isham's acute and chronic health concerns.  We review, in detail, therapy consult note.  We talk about Mr. Goetsch's inability to safely take food and drink.  We talk about 2 pathways, comfort and dignity/hospice care versus trial of PEG tube.  We talked about the pros and cons of each.  [name] states understanding that a temporary nasal tube would not be safe as he would likely pull it out.  Conference with attending, bedside nursing staff, transition of care team related to patient condition, needs, goals of care, disposition.  Plan: At this point continue to treat the treatable but no CPR or intubation.  Agreeable to PEG tube trial (6 to 8 weeks).     50 minutes  Lillia Carmel, NP Palliative medicine team Team phone [phone]

## 2023-02-01 NOTE — Progress Notes (Incomplete)
PROGRESS NOTE   [name]  NWG:[id number] DOB: 1955/09/09 DOA: 01/28/2023 PCP: Benetta Spar, MD   Chief Complaint  Patient presents with   Altered Mental Status   Level of care: Telemetry  Brief Admission History:  68 year old male with chronic combined systolic and diastolic heart failure, hyperlipidemia, type 2 diabetes mellitus, diabetic polyneuropathy, hypertension, polysubstance abuse, stage III CKD history of cocaine abuse, failure to thrive, coronary artery disease, ischemic cardiomyopathy, history of LV mural thrombus, OSA, history of Fournier gangrene, GERD, who was recently hospitalized for acute heart failure and discharged to [facility].  He was sent to the ED by EMS for lethargic and confusion since about 7 PM that evening.  He reportedly has normally been alert and oriented.  He was not answering questions.  Stroke was suspected and he was seen by teleneurologist.  TNK was not given as he was outside the window.  Neurology recommended MRI brain and routine stroke workup.  They recommended aspirin 325 mg daily.   Assessment and Plan:  Acute ischemic CVA  - MRI brain positive for small acute infarct in the left insular white matter - ASA 325 mg daily ordered - encephalopathy worsened this morning and was transferred to stepdown ICU for bipap - reached out to neurology on call for further recommendations - neuro checks ordered - check A1c-7.2%, LDL - 29 on statin - TTE with bubble study ordered by neuro 1/20 pending - IV fluids ordered - NPO per SLP - discussed with neurology, EEG no epileptic activity seen; c/w encephalopathy - called out to neurosurgery on call regarding meningioma 1/20 - agree with neurologist that palliative care consult needed - updated daughter by telephone   Meningioma in the right middle cranial fossa with edema in the adjacent temporal lobe - given his persistent encephalopathy, I discussed with neurosurgeon Dr. Danielle Dess on  1/20 - he said we could do a trial of dexamethasone to see if patient improves.  He recommended 4 mg BID.  If patient improves on this, he said to discharge patient on oral decadron and follow up with him in the office to discuss surgical removal of the meningioma.    Acute respiratory failure with hypoxia Aspiration pneumonia - IV ampicillin sulbactam ordered after blood cultures - BIPAP therapy ordered and transferred to stepdown ICU on 1/19 - scheduled bronchodilators Duonebs Q4h - continue bipap QHS   Acute respiratory acidosis - management as above with bipap therapy  - mentation improved with bipap therapy - repeated ABG reassuring    AKI on CKD stage 3b - creatinine remains elevated - he is likely dry from diuretics and poor oral intake - IV fluid hydration ordered (gentle given cardiomyopathy) - renally dose medications - follow BMP daily    Type 2 DM uncontrolled with vascular complications - A1c 7.8% - SSI coverage for now with frequent CBG testing  CBG (last 3)  Recent Labs    02/01/23 0739 02/01/23 1156 02/01/23 1600  GLUCAP 161* 158* 157*   History of polysubstance abuse - he is currently residing in a SNF - UDS negative    Leukocytosis  - suspect secondary to aspiration pneumonia - treating as above - follow CBC with diff    Normocytic Anemia - follow CBC closely  - transfuse for Hg<7 - continue daily iron supplement     Hyperlipidemia - check fasting lipid panel - resume home atorvastatin daily 40 mg   Essential Hypertension  - allowing permissive hypertension in setting of acute CVA  per neuro recs   Ischemic cardiomyopathy  - recent TTE 12/24 LVEF 30-35%  - TTE with bubble study 1/20: LVEF now 25-30%, no LV thrombus, neg bubble study;  - holding home torsemide 40 mg temporarily   Hypernatremia - he is not eating or drinking well - he did poorly on swallow evaluation - added more free water to IV fluid  - recheck in AM  - palliative  consult for goals of care requested    DVT prophylaxis: sq heparin  Code Status: Full  Family Communication: daughter 1/18, 1/19, 1/20, 1/21 Disposition: anticipate SNF rehab    Consultants:  Neurology   Procedures:    Antimicrobials:    Subjective: -Episode of choking and coughing with attempts to give him water RN Olivia at bedside  Objective: Vitals:   02/01/23 1200 02/01/23 1207 02/01/23 1300 02/01/23 1400  BP: (!) 142/76  (!) 137/107 (!) 137/108  Pulse: 89  87 84  Resp: 12  12 11   Temp:  97.6 F (36.4 C)    TempSrc:  Axillary    SpO2: 91%  92% 91%  Weight:      Height:        Intake/Output Summary (Last 24 hours) at 02/01/2023 1853 Last data filed at 02/01/2023 1500 Gross per 24 hour  Intake 504.43 ml  Output 1300 ml  Net -795.57 ml   Filed Weights   01/30/23 0416 01/31/23 0702 02/01/23 0500  Weight: 97.3 kg 95.7 kg 94.8 kg   Examination:  General exam: Appears calm and comfortable on [place].  Slurring speech and drooling improving.   Respiratory system:  rales heard on left side, no increased WOB. Cardiovascular system: normal S1 & S2 heard. No JVD, murmurs, rubs, gallops or clicks. 1+ pedal edema. Gastrointestinal system: Abdomen is nondistended, soft and nontender. No organomegaly or masses felt. Normal bowel sounds heard. Central nervous system: Alert. Slightly improved Left hemiparesis, was able to move left arm. Extremities: feet severely painful. Skin: No rashes, lesions or ulcers. Psychiatry: Judgement and insight appear UTD. Mood & affect UTD.    Data Reviewed: I have personally reviewed following labs and imaging studies  CBC: Recent Labs  Lab 01/28/23 0214 01/29/23 0451 01/30/23 0429 01/31/23 0413 02/01/23 0507  WBC 15.7* 24.1* 15.7* 14.2* 11.4*  NEUTROABS 13.2* 21.8* 13.5* 13.0* 10.4*  HGB 8.2* 8.0* 8.0* 8.2* 8.5*  HCT 31.3* 30.2* 29.4* 31.6* 32.0*  MCV 80.7 79.3* 77.6* 79.0* 79.0*  PLT 321 359 340 315 322    Basic Metabolic  Panel: Recent Labs  Lab 01/28/23 0214 01/29/23 0451 01/30/23 0429 01/31/23 0413 02/01/23 0507  NA 137 141 144 152* 154*  K 4.2 4.0 3.9 4.1 4.1  CL 103 108 110 118* 120*  CO2 24 23 23 24 26   GLUCOSE 111* 117* 147* 159* 192*  BUN 62* 67* 71* 57* 46*  CREATININE 3.03* 2.97* 2.80* 2.19* 1.73*  CALCIUM 8.4* 8.3* 8.6* 8.7* 9.2  MG  --  2.4  --   --   --     CBG: Recent Labs  Lab 01/31/23 2354 02/01/23 0406 02/01/23 0739 02/01/23 1156 02/01/23 1600  GLUCAP 171* 152* 161* 158* 157*    Recent Results (from the past 240 hours)  Culture, blood (Routine X 2) w Reflex to ID Panel     Status: None (Preliminary result)   Collection Time: 01/28/23  9:39 AM   Specimen: BLOOD  Result Value Ref Range Status   Specimen Description BLOOD BLOOD RIGHT ARM  Final  Special Requests   Final    BOTTLES DRAWN AEROBIC AND ANAEROBIC Blood Culture adequate volume   Culture   Final    NO GROWTH 4 DAYS Performed at [facility], [address], [place], [place] [address]    Report Status PENDING  Incomplete  Culture, blood (Routine X 2) w Reflex to ID Panel     Status: None (Preliminary result)   Collection Time: 01/28/23  9:46 AM   Specimen: BLOOD  Result Value Ref Range Status   Specimen Description BLOOD LEFT ANTECUBITAL  Final   Special Requests   Final    BOTTLES DRAWN AEROBIC AND ANAEROBIC Blood Culture adequate volume   Culture   Final    NO GROWTH 4 DAYS Performed at [facility], [address] [place], [place] [address]    Report Status PENDING  Incomplete  MRSA Next Gen by PCR, Nasal     Status: Abnormal   Collection Time: 01/28/23 10:42 AM   Specimen: Nasal Mucosa; Nasal Swab  Result Value Ref Range Status   MRSA by PCR Next Gen DETECTED (A) NOT DETECTED Final    Comment: RESULT CALLED TO, READ BACK BY AND VERIFIED WITH: IRVING,L ON 01/28/23 AT 2125 BY PURDIE,J        The GeneXpert MRSA Assay (FDA approved for NASAL specimens only), is one component of a comprehensive MRSA  colonization surveillance program. It is not intended to diagnose MRSA infection nor to guide or monitor treatment for MRSA infections. Performed at [facility], [address] [place], [place] [address]       Radiology Studies: EEG adult Result Date: 01/31/2023 Charlsie Quest, MD     01/31/2023  8:39 AM Patient Name: JUELLZ PICARIELLO MRN: [id number] Epilepsy Attending: Charlsie Quest Referring Physician/Provider: Cleora Fleet, MD Date: 01/30/2023 Duration: 22.52 mins Patient history: 68yo M with ams getting eeg to evaluate for seizure Level of alertness: Awake AEDs during EEG study: GBP Technical aspects: This EEG study was done with scalp electrodes positioned according to the 10-20 International system of electrode placement. Electrical activity was reviewed with band pass filter of 1-70Hz , sensitivity of 7 uV/mm, display speed of 30mm/sec with a 60Hz  notched filter applied as appropriate. EEG data were recorded continuously and digitally stored.  Video monitoring was available and reviewed as appropriate. Description: EEG showed continuous generalized polymorphic 3 to 6 Hz theta-delta slowing. Hyperventilation and photic stimulation were not performed.   ABNORMALITY - Continuous slow, generalized IMPRESSION: This study is suggestive of moderate diffuse encephalopathy. No seizures or epileptiform discharges were seen throughout the recording. Priyanka Annabelle Harman     Scheduled Meds:  allopurinol  100 mg Oral Daily   aspirin  300 mg Rectal Daily   Or   aspirin  81 mg Oral Daily   atorvastatin  40 mg Oral QHS   Chlorhexidine Gluconate Cloth  6 each Topical Daily   Chlorhexidine Gluconate Cloth  6 each Topical Q0600   dexamethasone (DECADRON) injection  4 mg Intravenous Q12H   ferrous sulfate  325 mg Oral Q breakfast   folic acid  1 mg Oral Daily   gabapentin  100 mg Oral QHS   heparin  5,000 Units Subcutaneous Q8H   insulin aspart  0-9 Units Subcutaneous Q4H    ipratropium-albuterol  3 mL Nebulization BID   linaclotide  145 mcg Oral QAC breakfast   mupirocin ointment  1 Application Nasal BID   Continuous Infusions:  sodium chloride 83 mL/hr at 02/01/23 [phone]  ampicillin-sulbactam (UNASYN) IV 3 g (02/01/23 1802)     LOS: 4 days     Shon Hale, MD    02/01/2023, 6:53 PM

## 2023-02-01 NOTE — Plan of Care (Signed)
  Problem: Health Behavior/Discharge Planning: Goal: Ability to manage health-related needs will improve 02/01/2023 2314 by Peggye Form, RN Outcome: Not Progressing   Problem: Education: Goal: Knowledge of General Education information will improve Description: Including pain rating scale, medication(s)/side effects and non-pharmacologic comfort measures 02/01/2023 2314 by Peggye Form, RN Outcome: Not Progressing

## 2023-02-01 NOTE — Plan of Care (Signed)
  Problem: Elimination: Goal: Will not experience complications related to bowel motility Outcome: Progressing   Problem: Tissue Perfusion: Goal: Adequacy of tissue perfusion will improve Outcome: Progressing

## 2023-02-01 NOTE — Plan of Care (Signed)

## 2023-02-01 NOTE — Progress Notes (Signed)
Speech Language Pathology Treatment: Dysphagia  Patient Details Name: [name] MRN: [id number] DOB: Nov 29, 1955 Today's Date: 02/01/2023 Time: 1340-1400 SLP Time Calculation (min) (ACUTE ONLY): 20 min  Assessment / Plan / Recommendation Clinical Impression  Provided ongoing diagnostic dysphagia therapy. Attempted to rouse Pt by repositioning and using max verbal and tactile cues. Note dry crusted secretions on lingual surfaces that were cleansed by toothette; Pt began pursing his lips and tightly closing them after oral care initiated. Pt did briefly open his eyes and say "yes" when asked if he wanted water however when presented a single ice chip pt closed his lips and would not accept. With ongoing attempts Pt did accept one single ice chip, he allowed it to sit in his oral cavity with no oral manipulation and no visible or palpable swallow noted. Despite Pt being in and out of alertness he was not consistently alert and would not accept/take even a single sip of water. recommend continue NPO except for ice chips/small sips of water after oral care and ok for PO medication if Pt alert and willing to accept. Unfortunately Pt continues to generally refuse. Our service will continue efforts, thank you   HPI HPI: [name] is a 68 y.o. male who is  [facility] with past medical history of strokes, meningioma, hypertension, diabetes, LV thrombus was on Xarelto more than 2 years ago, CKD, CHF, coronary artery disease who was brought in after altered mental status.     Patient is confused and unable to provide any history.  Per chart review, patient was recently admitted from 01/14/2023 to 01/17/2023 for acute on chronic congestive heart failure as well as AKI on CKD.  He was then seen again in the emergency room on 01/21/2023 for lower abdominal pain, nausea and vomiting.  CT Abdo and pelvis was done which was concerning for enteritis, no bowel obstruction.  He was given Zofran  followed by Compazine which finally helped with the symptoms and he was discharged.  He was then brought back on 01/22/2023 for altered mental status, nausea, vomiting and low oxygen saturations.  He was admitted on the medicine team.  MRI brain was obtained and showed acute ischemic stroke.  Therefore neurology was consulted for further recommendations . BSE/SLE requested. Pt failed the [place].      SLP Plan  Continue with current plan of care      Recommendations for follow up therapy are one component of a multi-disciplinary discharge planning process, led by the attending physician.  Recommendations may be updated based on patient status, additional functional criteria and insurance authorization.    Recommendations  Diet recommendations: NPO Medication Administration: Crushed with puree                  Oral care prior to ice chip/H20;Staff/trained caregiver to provide oral care     Dysphagia, unspecified (R13.10)     Continue with current plan of care    [name] H. Romie Levee, CCC-SLP Speech Language Pathologist  [name]  02/01/2023, 2:03 PM

## 2023-02-01 NOTE — Patient Instructions (Signed)
Visit Information  Thank you for taking time to visit with me today. Please don't hesitate to contact me if I can be of assistance to you.   Following are the goals we discussed today:   Goals Addressed               This Visit's Progress     Find Help in My Community. (pt-stated)   On track     Care Coordination Interventions:   Interventions Today    Flowsheet Row Most Recent Value  Chronic Disease   Chronic disease during today's visit Congestive Heart Failure (CHF), Hypertension (HTN), Diabetes, Chronic Kidney Disease/End Stage Renal Disease (ESRD), Other  [Meningioma w/ Encephalopathy & Cerebral Edema, Cocaine Abuse, Tobacco Abuse, Inability to Perform Activities of Daily Living Independently, Caregiver Burnout/Stress/Fatigue, Chronic Pain, Pleural Effusion, Re-Hospitalized, Daughter Requesting Placement.]  General Interventions   General Interventions Discussed/Reviewed General Interventions Discussed, Labs, Vaccines, Doctor Visits, Health Screening, Annual Foot Exam, Lipid Profile, General Interventions Reviewed, Annual Eye Exam, Durable Medical Equipment (DME), Community Resources, Level of Care, Communication with  [Encouraged Routine Engagement with Care Team Members & Providers.]  Labs Hgb A1c every 3 months, Kidney Function  [Encouraged Routine Labwork.]  Vaccines COVID-19, Flu, Pneumonia, RSV, Shingles, Tetanus/Pertussis/Diphtheria  [Encouraged Routine Vaccinations.]  Doctor Visits Discussed/Reviewed Doctor Visits Discussed, Specialist, Doctor Visits Reviewed, Annual Wellness Visits, PCP  [Encouraged Routine Engagement with Care Team Members & Providers.]  Health Screening Bone Density, Colonoscopy, Prostate  [Encouraged Routine Health Screenings.]  Durable Medical Equipment (DME) Dan Humphreys, Wheelchair, Research scientist (medical) & Daughter Scientist, forensic with Back, Glucometer or Continuous Glucose Monitor, Blood Pressure Monitor & Scales. CSW Will Request  Order from Primary Care Provider.]  Wheelchair Standard  PCP/Specialist Visits Compliance with follow-up visit  [Encouraged Routine Engagement with Care Team Members & Providers.]  Communication with PCP/Specialists, RN, Pharmacists, Social Work  [employer] Routine Engagement with Care Team Members & Providers.]  Level of Care Adult Daycare, Air traffic controller, Assisted Living, Skilled Nursing Facility  [Two Weeks Ago, Patient Did Not Qualify for Gaffer at Skilled Nursing Facility Upon Discharge from Hospital, Returning Home with Home Health Services through [facility].]  Applications Medicaid, [place]-2, Other  [Now That Patient Has Been Re-Hospitalized, Daughter is Wanting to [employer] Level of Care Options (I.e Skilled Secondary school teacher for Gaffer or Long-Term Memory Care Assisted Living Facility Placement).]  Exercise Interventions   Exercise Discussed/Reviewed Exercise Discussed, Assistive device use and maintanence, Exercise Reviewed, Physical Activity, Weight Managment  [Patient Encouraged to Work with Physical Therapist & Acupuncturist, While Hospitalized.]  Physical Activity Discussed/Reviewed Home Exercise Program (HEP), Physical Activity Discussed, PREP, Physical Activity Reviewed, Gym, Types of exercise  [Encouraged Increased Level of Activity & Exercise, as Tolerated.]  Weight Management Weight loss  [Encouraged Healthy [employer] Loss Program.]  Education Interventions   Education Provided Provided Therapist, sports, Provided Web-based Education, Provided Education  [employer] Daughter: Merchandiser, retail Long-Term Care Medicaid Application, Passenger transport manager, Aide & Attendance Benefits Information & Application, Aging, Disability & Transit Engineer, technical sales.]  Provided Verbal Education On Nutrition, Mental Health/Coping with Illness, When to see the doctor, Foot Care, Eye Care, Labs,  Applications, Blood Sugar Monitoring, Exercise, Medication, [employer], Psychologist, sport and exercise Reviewed to Ensure Understanding & Entertain Questions.]  Labs Reviewed Hgb A1c  [Reviewed.]  Applications Medicaid, [place]-2, Other  [Now That Patient Has Been Re-Hospitalized, Daughter is Wanting to [employer] Level of Care Options (I.e Skilled Nursing  Facility Placement for [employer] or Long-Term Memory Care Assisted Living Facility Placement).]  Mental Health Interventions   Mental Health Discussed/Reviewed Mental Health Discussed, Anxiety, Mental Health Reviewed, Depression, Grief and Loss, Coping Strategies, Substance Abuse, Suicide, Crisis, Other  [Assessed Mental Health & Cognitive Status.]  Nutrition Interventions   Nutrition Discussed/Reviewed Nutrition Discussed, Adding fruits and vegetables, Increasing proteins, Nutrition Reviewed, Decreasing fats, Fluid intake, Carbohydrate meal planning, Portion sizes, Decreasing salt, Decreasing sugar intake  [Encouraged Heart-Healthy, Diabetic-Friendly, Low Sodium, Renal-Friendly, Reduced Fat Diet.]  Pharmacy Interventions   Pharmacy Dicussed/Reviewed Pharmacy Topics Discussed, Medications and their functions, Medication Adherence, Pharmacy Topics Reviewed, Affording Medications  [Confirmed Ability to Afford Prescription Medications.]  Medication Adherence --  [Confirmed Prescription Medication Compliance.]  Safety Interventions   Safety Discussed/Reviewed Safety Discussed, Safety Reviewed, Fall Risk, Home Safety  [Encouraged Routine Use of Assistive Devices & Durable Medical Equipment.]  Home Safety Assistive Devices, Need for home safety assessment, Refer for community resources, Refer for home visit, Contact provider for referral to PT/OT, Contact home health agency  [Encouraged Consideration of Gaffer in Skilled Nursing Facility, Upon Discharge from Hospital.]  Advanced Directive  Interventions   Advanced Directives Discussed/Reviewed Advanced Directives Discussed, Advanced Directives Reviewed, Advanced Care Planning, Guardianship  [Encouraged Initiation of Advanced Directives (Living Will & Healthcare Power of Corporate treasurer), Offering to [employer], Assist with Completion, Make Copies & Scan into Electronic Medical Record in Epic.]  Guardianship Provide resources, Refer to an agency  [Provided Daughter with Information on Filing for Guardianship at [employer] Process for Becoming Patient's Legal Guardian.]      Active Listening & Reflection Utilized. Verbalization of Feelings Encouraged. Emotional Support Provided. CSW Collaboration with Daughter, Jamesjoseph Baun to Address Patient's Hospital Admission & Confirm Desire for Patient to Return to [facility] [phone]) to Resume Short-Term Rehabilitative Services, Upon Medical Clearance & Discharge from Hospital.  CSW Collaboration with Daughter, Brenndon Mathus to [employer] of Interest in [place] & [facility], from List Provided:  ~ Assisted Living Facilities in [place] & [place].  ~ Extended Care Facilities in [place] & [place]. ~ Memory Care Assisted Living Facilities in [place] & [place]. ~ Skilled Nursing Facilities in [place] & [place].  CSW Collaboration with Daughter, Betzalel Dohner to Encourage Initiation of Applications of Interest & Outreach to [employer] of Interest, from List Provided:   ~ How to Apply for Aid & Attendance Benefits - [employer]. ~ Aid & Attendance Radio producer - Merchandiser, retail.  ~ Associate Professor to [employer] Personal Information - Merchandiser, retail.  ~ Advice worker - Merchandiser, retail.  ~ Public librarian in Building surveyor of Claim - Merchandiser, retail. ~ Personal assistant. ~ Step-By-Step Instructions on How to Apply for  Health Net. ~ 2024 Medicaid Income Guidelines & Reserve Licensed conveyancer. ~ Making Medicaid Accessible Through [employer]. ~ Aging, Disability & Transit Services of [place] [employer].  ~ [employer]. ~ Quarry manager in [place].  ~ Designer, industrial/product (Living Will Microbiologist).  ~ Instructions on How to File for Legal Guardianship. ~ Guardianship Application. ~ Cyd Silence Brochure. CSW Collaboration with Daughter, Ashland Vongphakdy to Encourage Routine Engagement with Danford Bad, Licensed Clinical Social Worker with [facility] [phone]), if She Has Questions, Needs Assistance, or If Additional Social Work Needs Are Identified Between Now & Our Next Follow-Up  Outreach Call, Scheduled on 02/14/2023 at 12:00 PM.      Our next appointment is by telephone on 02/14/2023 at 12:00 pm.  Please call the care guide team at [phone] if you need to cancel or reschedule your appointment.   If you are experiencing a Mental Health or Behavioral Health Crisis or need someone to talk to, please call the Suicide and Crisis Lifeline: 988 call the [place] National Suicide Prevention Lifeline: (586) 312-0917 or TTY: 548-592-6857 TTY (512) 031-2748) to talk to a trained counselor call 1-800-273-TALK (toll free, 24 hour hotline) go to [facility] [address] [place] [phone]) call the [facility] Crisis Line: [phone] call 911  Patient verbalizes understanding of instructions and care plan provided today and agrees to view in MyChart. Active MyChart status and patient understanding of how to access instructions and care plan via MyChart confirmed with patient.     Telephone follow up appointment with care management team member scheduled for:  02/14/2023 at 12:00 pm.  Danford Bad, BSW, MSW,  LCSW [place]  [facility], [facility] Clinical Social Worker II Direct Dial: [phone]  Fax: [phone] Website: [email or web address]

## 2023-02-02 ENCOUNTER — Inpatient Hospital Stay (HOSPITAL_COMMUNITY): Payer: No Typology Code available for payment source | Admitting: Anesthesiology

## 2023-02-02 ENCOUNTER — Inpatient Hospital Stay (HOSPITAL_COMMUNITY): Payer: Medicare Other | Admitting: Anesthesiology

## 2023-02-02 ENCOUNTER — Encounter (HOSPITAL_COMMUNITY)
Admission: EM | Disposition: A | Discharge status: Nursing Home (Includes ICF) | Payer: Self-pay | Source: Skilled Nursing Facility | Attending: Family Medicine

## 2023-02-02 DIAGNOSIS — I130 Hypertensive heart and chronic kidney disease with heart failure and stage 1 through stage 4 chronic kidney disease, or unspecified chronic kidney disease: Secondary | ICD-10-CM | POA: Diagnosis not present

## 2023-02-02 DIAGNOSIS — I2510 Atherosclerotic heart disease of native coronary artery without angina pectoris: Secondary | ICD-10-CM | POA: Diagnosis not present

## 2023-02-02 DIAGNOSIS — R4182 Altered mental status, unspecified: Secondary | ICD-10-CM | POA: Diagnosis not present

## 2023-02-02 DIAGNOSIS — Z7189 Other specified counseling: Secondary | ICD-10-CM | POA: Diagnosis not present

## 2023-02-02 DIAGNOSIS — R1310 Dysphagia, unspecified: Secondary | ICD-10-CM

## 2023-02-02 DIAGNOSIS — I69391 Dysphagia following cerebral infarction: Secondary | ICD-10-CM

## 2023-02-02 DIAGNOSIS — I639 Cerebral infarction, unspecified: Secondary | ICD-10-CM | POA: Diagnosis not present

## 2023-02-02 DIAGNOSIS — I5043 Acute on chronic combined systolic (congestive) and diastolic (congestive) heart failure: Secondary | ICD-10-CM

## 2023-02-02 DIAGNOSIS — I255 Ischemic cardiomyopathy: Secondary | ICD-10-CM | POA: Diagnosis not present

## 2023-02-02 DIAGNOSIS — Z931 Gastrostomy status: Secondary | ICD-10-CM

## 2023-02-02 DIAGNOSIS — G9341 Metabolic encephalopathy: Secondary | ICD-10-CM

## 2023-02-02 DIAGNOSIS — I63232 Cerebral infarction due to unspecified occlusion or stenosis of left carotid arteries: Secondary | ICD-10-CM

## 2023-02-02 DIAGNOSIS — Z515 Encounter for palliative care: Secondary | ICD-10-CM | POA: Diagnosis not present

## 2023-02-02 HISTORY — PX: PEG PLACEMENT: SHX5437

## 2023-02-02 HISTORY — PX: ESOPHAGOGASTRODUODENOSCOPY (EGD) WITH PROPOFOL: SHX5813

## 2023-02-02 LAB — CULTURE, BLOOD (ROUTINE X 2)
Special Requests: ADEQUATE
Special Requests: ADEQUATE

## 2023-02-02 LAB — CBC WITH DIFFERENTIAL/PLATELET
Abs Immature Granulocytes: 0.07 10*3/uL (ref 0.00–0.07)
Basophils Absolute: 0 10*3/uL (ref 0.0–0.1)
Basophils Relative: 0 %
Eosinophils Absolute: 0 10*3/uL (ref 0.0–0.5)
Eosinophils Relative: 0 %
HCT: 32.2 % — ABNORMAL LOW (ref 39.0–52.0)
Hemoglobin: 8.5 g/dL — ABNORMAL LOW (ref 13.0–17.0)
Immature Granulocytes: 1 %
Lymphocytes Relative: 4 %
Lymphs Abs: 0.4 10*3/uL — ABNORMAL LOW (ref 0.7–4.0)
MCH: 20.7 pg — ABNORMAL LOW (ref 26.0–34.0)
MCHC: 26.4 g/dL — ABNORMAL LOW (ref 30.0–36.0)
MCV: 78.5 fL — ABNORMAL LOW (ref 80.0–100.0)
Monocytes Absolute: 0.3 10*3/uL (ref 0.1–1.0)
Monocytes Relative: 3 %
Neutro Abs: 7.9 10*3/uL — ABNORMAL HIGH (ref 1.7–7.7)
Neutrophils Relative %: 92 %
Platelets: 298 10*3/uL (ref 150–400)
RBC: 4.1 MIL/uL — ABNORMAL LOW (ref 4.22–5.81)
RDW: 23.9 % — ABNORMAL HIGH (ref 11.5–15.5)
Smear Review: ADEQUATE
WBC: 8.6 10*3/uL (ref 4.0–10.5)
nRBC: 2 % — ABNORMAL HIGH (ref 0.0–0.2)

## 2023-02-02 LAB — GLUCOSE, CAPILLARY
Glucose-Capillary: 139 mg/dL — ABNORMAL HIGH (ref 70–99)
Glucose-Capillary: 142 mg/dL — ABNORMAL HIGH (ref 70–99)
Glucose-Capillary: 147 mg/dL — ABNORMAL HIGH (ref 70–99)
Glucose-Capillary: 152 mg/dL — ABNORMAL HIGH (ref 70–99)
Glucose-Capillary: 159 mg/dL — ABNORMAL HIGH (ref 70–99)
Glucose-Capillary: 169 mg/dL — ABNORMAL HIGH (ref 70–99)

## 2023-02-02 LAB — BASIC METABOLIC PANEL
Anion gap: 11 (ref 5–15)
BUN: 47 mg/dL — ABNORMAL HIGH (ref 8–23)
CO2: 24 mmol/L (ref 22–32)
Calcium: 9.2 mg/dL (ref 8.9–10.3)
Chloride: 121 mmol/L — ABNORMAL HIGH (ref 98–111)
Creatinine, Ser: 1.64 mg/dL — ABNORMAL HIGH (ref 0.61–1.24)
GFR, Estimated: 45 mL/min — ABNORMAL LOW (ref >60)
Glucose, Bld: 149 mg/dL — ABNORMAL HIGH (ref 70–99)
Potassium: 4.1 mmol/L (ref 3.5–5.1)
Sodium: 156 mmol/L — ABNORMAL HIGH (ref 135–145)

## 2023-02-02 LAB — ALBUMIN: Albumin: 2.7 g/dL — ABNORMAL LOW (ref 3.5–5.0)

## 2023-02-02 LAB — PHOSPHORUS: Phosphorus: 3.4 mg/dL (ref 2.5–4.6)

## 2023-02-02 SURGERY — INSERTION, PEG TUBE
Anesthesia: General | Site: Mouth

## 2023-02-02 MED ORDER — LACTATED RINGERS IV SOLN: INTRAVENOUS | Status: DC | PRN

## 2023-02-02 MED ORDER — FENTANYL CITRATE (PF) 100 MCG/2ML IJ SOLN
INTRAMUSCULAR | Status: AC
  Filled 2023-02-02: qty 2

## 2023-02-02 MED ORDER — TRIPLE ANTIBIOTIC 3.5-400-5000 EX OINT
1.0000 | TOPICAL_OINTMENT | Freq: Every day | CUTANEOUS | Status: AC
  Administered 2023-02-02 – 2023-02-08 (×6): 1 via TOPICAL
  Filled 2023-02-02 (×3): qty 1

## 2023-02-02 MED ORDER — CHLORHEXIDINE GLUCONATE CLOTH 2 % EX PADS
6.0000 | MEDICATED_PAD | Freq: Once | CUTANEOUS | Status: DC
  Administered 2023-02-02: 6 via TOPICAL

## 2023-02-02 MED ORDER — METOPROLOL SUCCINATE ER 25 MG PO TB24
12.5000 mg | ORAL_TABLET | Freq: Every day | ORAL | Status: DC
  Administered 2023-02-03: 12.5 mg via ORAL
  Filled 2023-02-02 (×2): qty 1

## 2023-02-02 MED ORDER — CHLORHEXIDINE GLUCONATE 0.12 % MT SOLN: 15.0000 mL | Freq: Once | OROMUCOSAL | Status: DC

## 2023-02-02 MED ORDER — OXYCODONE HCL 5 MG/5ML PO SOLN: 5.0000 mg | Freq: Once | ORAL | Status: DC | PRN

## 2023-02-02 MED ORDER — JEVITY 1.5 CAL/FIBER PO LIQD
1000.0000 mL | ORAL | Status: DC
  Administered 2023-02-02: 1000 mL
  Filled 2023-02-02 (×6): qty 1000

## 2023-02-02 MED ORDER — FENTANYL CITRATE PF 50 MCG/ML IJ SOSY
25.0000 ug | PREFILLED_SYRINGE | Freq: Once | INTRAMUSCULAR | Status: AC
  Administered 2023-02-02: 25 ug via INTRAVENOUS

## 2023-02-02 MED ORDER — ONDANSETRON HCL 4 MG/2ML IJ SOLN: 4.0000 mg | Freq: Once | INTRAMUSCULAR | Status: DC | PRN

## 2023-02-02 MED ORDER — ADULT MULTIVITAMIN W/MINERALS CH
1.0000 | ORAL_TABLET | Freq: Every day | ORAL | Status: DC
  Administered 2023-02-02 – 2023-02-14 (×12): 1
  Filled 2023-02-02 (×13): qty 1

## 2023-02-02 MED ORDER — PROPOFOL 10 MG/ML IV BOLUS
INTRAVENOUS | Status: DC | PRN
  Administered 2023-02-02: 50 ug/kg/min via INTRAVENOUS

## 2023-02-02 MED ORDER — LACTATED RINGERS IV SOLN: INTRAVENOUS | Status: DC

## 2023-02-02 MED ORDER — FREE WATER
200.0000 mL | Status: DC
  Administered 2023-02-02 – 2023-02-03 (×4): 200 mL

## 2023-02-02 MED ORDER — CEFAZOLIN SODIUM-DEXTROSE 2-4 GM/100ML-% IV SOLN
2.0000 g | INTRAVENOUS | Status: AC
  Administered 2023-02-02: 2 g via INTRAVENOUS

## 2023-02-02 MED ORDER — HYDRALAZINE HCL 10 MG PO TABS
10.0000 mg | ORAL_TABLET | Freq: Two times a day (BID) | ORAL | Status: DC
Start: 2023-02-02 — End: 2023-02-04
  Administered 2023-02-02 – 2023-02-03 (×3): 10 mg via ORAL
  Filled 2023-02-02 (×4): qty 1

## 2023-02-02 MED ORDER — OXYCODONE HCL 5 MG PO TABS: 5.0000 mg | ORAL_TABLET | Freq: Once | ORAL | Status: DC | PRN

## 2023-02-02 MED ORDER — CHLORHEXIDINE GLUCONATE CLOTH 2 % EX PADS: 6.0000 | MEDICATED_PAD | Freq: Once | CUTANEOUS | Status: DC

## 2023-02-02 MED ORDER — FENTANYL CITRATE PF 50 MCG/ML IJ SOSY: 25.0000 ug | PREFILLED_SYRINGE | INTRAMUSCULAR | Status: DC | PRN

## 2023-02-02 MED ORDER — PROPOFOL 10 MG/ML IV BOLUS
INTRAVENOUS | Status: AC
  Filled 2023-02-02: qty 20

## 2023-02-02 MED ORDER — ISOSORBIDE DINITRATE 10 MG PO TABS
5.0000 mg | ORAL_TABLET | Freq: Two times a day (BID) | ORAL | Status: DC
  Administered 2023-02-02 – 2023-02-03 (×3): 5 mg via ORAL
  Filled 2023-02-02 (×9): qty 1

## 2023-02-02 MED ORDER — BACITRACIN-NEOMYCIN-POLYMYXIN OINTMENT TUBE
TOPICAL_OINTMENT | CUTANEOUS | Status: DC | PRN
  Administered 2023-02-02: 1 via TOPICAL

## 2023-02-02 MED ORDER — FENTANYL CITRATE PF 50 MCG/ML IJ SOSY
PREFILLED_SYRINGE | INTRAMUSCULAR | Status: AC
  Filled 2023-02-02: qty 1

## 2023-02-02 MED ORDER — MIDAZOLAM HCL 2 MG/2ML IJ SOLN
INTRAMUSCULAR | Status: AC
Start: 2023-02-02 — End: ?
  Filled 2023-02-02: qty 2

## 2023-02-02 MED ORDER — ORAL CARE MOUTH RINSE: 15.0000 mL | Freq: Once | OROMUCOSAL | Status: DC

## 2023-02-02 MED ORDER — PROSOURCE TF20 ENFIT COMPATIBL EN LIQD
60.0000 mL | Freq: Two times a day (BID) | ENTERAL | Status: DC
  Administered 2023-02-02 – 2023-02-14 (×25): 60 mL
  Filled 2023-02-02 (×26): qty 60

## 2023-02-02 MED ORDER — LIDOCAINE HCL 1 % IJ SOLN
INTRAMUSCULAR | Status: DC | PRN
  Administered 2023-02-02: 4 mL

## 2023-02-02 MED ORDER — CEFAZOLIN SODIUM-DEXTROSE 2-4 GM/100ML-% IV SOLN
INTRAVENOUS | Status: AC
  Filled 2023-02-02: qty 100

## 2023-02-02 MED ORDER — SODIUM CHLORIDE 0.45 % IV SOLN: INTRAVENOUS | Status: AC

## 2023-02-02 SURGICAL SUPPLY — 7 items
CLOTH BEACON ORANGE TIMEOUT ST (SAFETY) ×2 IMPLANT
GLOVE BIOGEL PI IND STRL 7.0 (GLOVE) ×4 IMPLANT
GLOVE SURG SS PI 7.5 STRL IVOR (GLOVE) ×4 IMPLANT
KIT PEG SAFETY 20FR (KITS) ×2 IMPLANT
MANIFOLD NEPTUNE II (INSTRUMENTS) ×2 IMPLANT
POSITIONER HEAD 8X9X4 ADT (SOFTGOODS) ×2 IMPLANT
WATER STERILE IRR 1000ML POUR (IV SOLUTION) ×2 IMPLANT

## 2023-02-02 NOTE — Care Management Important Message (Signed)
Important Message  Patient Details  Name: [name] MRN: [id number] Date of Birth: 1955/06/01   Important Message Given:  Yes - Medicare IM     Corey Harold 02/02/2023, 11:41 AM

## 2023-02-02 NOTE — Interval H&P Note (Signed)
History and Physical Interval Note:  02/02/2023 9:27 AM  [name]  has presented today for surgery, with the diagnosis of dysphagia.  The various methods of treatment have been discussed with the patient and family. After consideration of risks, benefits and other options for treatment, the patient has consented to  Procedure(s): PERCUTANEOUS ENDOSCOPIC GASTROSTOMY (PEG) PLACEMENT (N/A) ESOPHAGOGASTRODUODENOSCOPY (EGD) WITH PROPOFOL (N/A) as a surgical intervention.  The patient's history has been reviewed, patient examined, no change in status, stable for surgery.  I have reviewed the patient's chart and labs.  Questions were answered to the patient's satisfaction.     Franky Macho

## 2023-02-02 NOTE — Progress Notes (Signed)
Moaning. Attempted mouth care. Pt uncooperative. Did not want it. Vaseline to lips.

## 2023-02-02 NOTE — Progress Notes (Addendum)
Initial Nutrition Assessment  DOCUMENTATION CODES:   Not applicable  INTERVENTION:   When PEG is ready to use, recommend: Jevity 1.5 at 20 ml/h, increase by 10 ml q 8 hours to goal rate of 60 ml/h (1440 ml per day)  Prosource TF20 60 ml BID  Free water flushes 200 ml q 4 hours  Provides 2320 kcal, 132 gm protein, 2294 ml free water (total with TF + flushes) daily.  MVI with minerals tablet, crushed and given via tube once daily. Liquid MVI is not a complete MVI, therefore, recommend MVI tablet crushed and given via tube to support wound healing.   Since patient has been NPO for 5 days, will need to monitor for refeeding syndrome when TF is initiated. Monitor magnesium, potassium, and phosphorus levels, MD to replete as needed. Add Thiamine 100 mg daily for 7 days.  NUTRITION DIAGNOSIS:   Inadequate oral intake related to inability to eat, dysphagia as evidenced by NPO status.  GOAL:   Patient will meet greater than or equal to 90% of their needs  MONITOR:   TF tolerance, Diet advancement  REASON FOR ASSESSMENT:   Rounds    ASSESSMENT:   68 yo male admitted with acute ischemic CVA. PMH includes HLD, HTN, CAD, CKD, ischemic cardiomyopathy, cocaine abuse, DM-2, CHF. Recent admission for acute HF, discharged to SNF on 01/17/23.  SLP following for ability to begin PO diet, however, patient has continued to refuse POs. He has been NPO since admission.   Palliative care team is following. [name] agreed to trial PEG + TF for 6-8 weeks.   20 [place] G-tube placed today by Careers adviser.  Per discussion with MD in rounds this morning, RD to leave recommendations for tube feeding.   Labs reviewed. Na 156 CBG: [id number]  Medications reviewed and include decadron, ferrous sulfate, folic acid, novolog.  Weight history reviewed. Patient has had 16% weight loss over the past 3 months. Unsure how much of that was fluids vs dry wt loss.   Admit weight: 95.3 kg  (1/18) Current weight: 94.2 kg (1/23)  NUTRITION - FOCUSED PHYSICAL EXAM:  Unable to complete  Diet Order:   Diet Order     None       EDUCATION NEEDS:   No education needs have been identified at this time  Skin:  Skin Assessment: Skin Integrity Issues: Skin Integrity Issues:: Stage II Stage II: sacrum  Last BM:  1/22  Height:   Ht Readings from Last 1 Encounters:  01/28/23 5\' 9"  (1.753 m)    Weight:   Wt Readings from Last 1 Encounters:  02/02/23 94.2 kg    Ideal Body Weight:  72.7 kg  BMI:  Body mass index is 30.67 kg/m.  Estimated Nutritional Needs:   Kcal:  2200-2400  Protein:  120-140 gm  Fluid:  2.2-2.4 L   Gabriel Rainwater RD, LDN, CNSC Contact Inpatient RD using Secure Chat. If unavailable, use group chat "RD Inpatient" via Secure Chat in EP[employer].

## 2023-02-02 NOTE — Op Note (Signed)
Patient:  ALECXIS QUATTRONE  DOB:  08-14-1955  MRN:  [id number]   Preop Diagnosis: Dysphagia  Postop Diagnosis: Same  Procedure: EGD with PEG placement  Surgeon: Franky Macho, MD  Assistant: [name], MD  Anes: MAC  Indications: Patient is a 68 year old black male who was encephalopathic who needs a PEG due to dysphagia.  The risks and benefits of the procedure were fully explained to the patient's daughter, who gave informed consent for the patient as the patient is encephalopathic.  Procedure note: The patient was placed in the supine position.  After monitored anesthesia care was given, the upper epigastric region was prepped and draped using usual sterile technique with Betadine.  Surgical site confirmation was performed.  The endoscope was advanced down to the second portion of the duodenum without difficulty.  No gross ulcerations were seen.  No stenosis of the pylorus was seen.  An area was palpated in the antrum transabdominally.  1% Xylocaine was used for local anesthesia.  A needle catheter was advanced into the stomach under direct visualization without difficulty.  The guidewire was then advanced and this was graphs using the snare and pulled out of the mouth with retraction of the endoscope.  A 20 French gastrostomy tube was then attached and using the pull technique, the gastrostomy tube was placed to the 3 cm Jasminne Mealy at the skin level.  Repeat endoscopy showed good position of the gastrostomy bulb.  The air was then evacuated from the stomach prior to removal of the endoscope.  A bolster was applied to the gastrostomy tube along with triple antibiotic ointment.  A dry sterile dressing was applied.  The patient tolerated the procedure well.  All tape and needle counts were correct at the end of the procedure.  The patient was transferred to PACU in stable condition.  Complications: None  EBL: Minimal  Specimen: None

## 2023-02-02 NOTE — Progress Notes (Addendum)
PROGRESS NOTE   [name]  UJW:[id number] DOB: September 20, 1955 DOA: 01/28/2023 PCP: Benetta Spar, MD   Chief Complaint  Patient presents with   Altered Mental Status   Level of care: Telemetry  Brief Admission History:  68 year old male with chronic combined systolic and diastolic heart failure, hyperlipidemia, type 2 diabetes mellitus, diabetic polyneuropathy, hypertension, polysubstance abuse, stage III CKD history of cocaine abuse, failure to thrive, coronary artery disease, ischemic cardiomyopathy, history of LV mural thrombus, OSA, history of Fournier gangrene, GERD, who was recently hospitalized for acute heart failure and discharged to [facility].  He was sent to the ED by EMS for lethargic and confusion since about 7 PM that evening.  He reportedly has normally been alert and oriented.  He was not answering questions.  Stroke was suspected and he was seen by teleneurologist.  TNK was not given as he was outside the window.  Neurology recommended MRI brain and routine stroke workup.  They recommended aspirin 325 mg daily.   Assessment and Plan:  1)Acute ischemic CVA with Dysphagia - MRI brain positive for small acute infarct in the left insular white matter -Neurology consult appreciated -A1c 7.2, LDL 29 -TTE with bubble study without evidence of LV thrombus, EF is 25 to 30% with wall motion abnormalities, Agitated saline contrast bubble study was negative, with no evidence of any interatrial shunt , no mitral stenosis, no aortic stenosis - c/n  ASA 81 mg daily and atorvastatin for secondary prevention  2)Meningioma in the Right Middle Cranial Fossa with Edema in the adjacent Temporal Lobe-- - Given his persistent encephalopathy, neurosurgeon Dr. Danielle Dess on 01/30/23 - Recommend trial of dexamethasone to see if patient improves.   -He recommended 4 mg BID.  If patient improves on this, he said to discharge patient on oral decadron and follow up with him in the office  to discuss surgical removal of the meningioma.  -- 3)Dysphagia with Swallowing Difficulties/Nutrition--- due to #1 #2 above -Speech therapy appreciated -Status post PEG tube placement on 02/02/2023 -Dietitian consult for tube feeding requested  4)Acute Encephalopathy -due to #1 #2 above -Neurology Consult appreciated - EEG no epileptic activity seen; c/w encephalopathy -Neurology, palliative care and neurosurgery input appreciated --please see above  5)HFrEF--chronic systolic dysfunction CHF---??  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 The entire inferior wall, apical lateral segment,  apical septal segment, and apical anterior segment are akinetic. The  anterior wall, antero-lateral wall, anterior septum, mid inferoseptal segment, and  basal inferoseptal segment are hypokinetic.  -give Toprol-XL  avoid ACEI/ARB/ARNI and  aldactone due to renal concerns Ok to use Hydralazine/Isosordil combo in lieu of ACEI/ARB -Home torsemide on hold due to AKI and dehydration concerns  6)AKI----acute kidney injury on CKD stage -3B  - Creatinine remains elevated above baseline - renally adjust medications, avoid nephrotoxic agents / dehydration  / hypotension   7)Acute respiratory failure with hypoxia due to Aspiration pneumonia - c/n  IV ampicillin sulbactam , may switch to Augmentin via PEG tube in 1 to 2 days -Continue bronchodilators and mucolytics - continue bipap QHS   8)DM2 A1c 7.2 reflecting uncontrolled DM with hyperglycemia PTA Use Novolog/Humalog Sliding scale insulin with Accu-Cheks/Fingersticks as ordered   9)History of polysubstance abuse including cocaine in the  past - he is currently residing in a SNF - UDS negative     10)Normocytic Anemia in the setting of underlying CKD 3B - -Hgb currently greater than 8 -No bleeding concerns monitor closely and transfuse as clinically indicated    11)Hypernatremia/hyperchloremia-- - -poor oral intake in the setting of acute stroke with dysphagia -Continue IV fluids until able to adequately give free water through PEG tube -PEG tube placed on 02/02/2023  12)PAD/CAD--status post prior angioplasty and stent placement -Continue aspirin, atorvastatin and metoprolol  13)social/ethics--- palliative care consult appreciated patient is a DNR/DNI   DVT prophylaxis: sq heparin  Code Status: DNR/DNI Family Communication: daughter is primary contact Disposition: anticipate SNF rehab    Consultants:  Neurology  Palliative care General Surgery for PEG tube placement on 02/02/2023  Procedures:  PEG tube 01/30/2023    Subjective: -Resting comfortably after PEG tube placement  Objective: Vitals:   02/02/23 1127 02/02/23 1130 02/02/23 1145 02/02/23 1212  BP:  111/86 (!) 129/105 (!) 133/113  Pulse: 82 84 85 92  Resp: (!) 26 (!) 29  18  Temp:    98.3 F (36.8 C)  TempSrc:    Oral  SpO2: 91% 94% 99% 100%  Weight:      Height:        Intake/Output Summary (Last 24 hours) at 02/02/2023 1236 Last data filed at 02/02/2023 1052 Gross per 24 hour  Intake 1465.92 ml  Output 1525 ml  Net -59.08 ml   Filed Weights   01/31/23 0702 02/01/23 0500 02/02/23 0500  Weight: 95.7 kg 94.8 kg 94.2 kg   Physical Exam  Gen:- Awake Alert, in no acute distress  HEENT:- [place].AT, No sclera icterus Neck-Supple Neck,No JVD,.  Lungs-  CTAB , fair air movement bilaterally  CV- S1, S2 normal, RRR Abd-  +ve B.Sounds, Abd Soft, No tenderness,   PEG tube in situ Extremity/Skin:- +ve  edema,   good pedal pulses , tenderness on palpation of feet and ankles, no obvious erythema no significant warmth or streaking Psych-confusional episodes with disorientation persist  neuro-improving  left-sided hemiparesis , speech and swallowing difficulties persist   Data Reviewed: I have personally reviewed following labs and imaging studies  CBC: Recent Labs  Lab 01/29/23 0451 01/30/23 0429 01/31/23 0413 02/01/23 0507 02/02/23 0423  WBC 24.1* 15.7* 14.2* 11.4* 8.6  NEUTROABS 21.8* 13.5* 13.0* 10.4* 7.9*  HGB 8.0* 8.0* 8.2* 8.5* 8.5*  HCT 30.2* 29.4* 31.6* 32.0* 32.2*  MCV 79.3* 77.6* 79.0* 79.0* 78.5*  PLT 359 340 315 322 298    Basic Metabolic Panel: Recent Labs  Lab 01/29/23 0451 01/30/23 0429 01/31/23 0413 02/01/23 0507 02/02/23 0423  NA 141 144 152* 154* 156*  K 4.0 3.9 4.1 4.1 4.1  CL 108 110 118* 120* 121*  CO2 23 23 24 26 24   GLUCOSE 117* 147* 159* 192* 149*  BUN 67* 71* 57* 46* 47*  CREATININE 2.97* 2.80* 2.19* 1.73* 1.64*  CALCIUM 8.3* 8.6* 8.7* 9.2 9.2  MG 2.4  --   --   --   --   PHOS  --   --   --   --  3.4    CBG: Recent Labs  Lab 02/01/23 1957 02/02/23 0017 02/02/23 0430 02/02/23 0724 02/02/23 0835  GLUCAP 146* 159* 142* 152* 139*    Recent Results (from the past 240 hours)  Culture, blood (Routine X 2) w Reflex to ID Panel     Status: None   Collection Time: 01/28/23  9:39 AM  Specimen: BLOOD  Result Value Ref Range Status   Specimen Description BLOOD BLOOD RIGHT ARM  Final   Special Requests   Final    BOTTLES DRAWN AEROBIC AND ANAEROBIC Blood Culture adequate volume   Culture   Final    NO GROWTH 5 DAYS Performed at [facility], [address], [place], [place] [address]    Report Status 02/02/2023 FINAL  Final  Culture, blood (Routine X 2) w Reflex to ID Panel     Status: None   Collection Time: 01/28/23  9:46 AM   Specimen: BLOOD  Result Value Ref Range Status   Specimen Description BLOOD LEFT ANTECUBITAL  Final   Special Requests   Final    BOTTLES DRAWN AEROBIC AND ANAEROBIC Blood Culture adequate volume   Culture   Final    NO GROWTH 5 DAYS Performed at [facility], [address], [place], [place] [address]     Report Status 02/02/2023 FINAL  Final  MRSA Next Gen by PCR, Nasal     Status: Abnormal   Collection Time: 01/28/23 10:42 AM   Specimen: Nasal Mucosa; Nasal Swab  Result Value Ref Range Status   MRSA by PCR Next Gen DETECTED (A) NOT DETECTED Final    Comment: RESULT CALLED TO, READ BACK BY AND VERIFIED WITH: IRVING,L ON 01/28/23 AT 2125 BY PURDIE,J        The GeneXpert MRSA Assay (FDA approved for NASAL specimens only), is one component of a comprehensive MRSA colonization surveillance program. It is not intended to diagnose MRSA infection nor to guide or monitor treatment for MRSA infections. Performed at [facility], [address], [place], [place] [address]       Radiology Studies: No results found.    Scheduled Meds:  allopurinol  100 mg Oral Daily   aspirin  300 mg Rectal Daily   Or   aspirin  81 mg Oral Daily   atorvastatin  40 mg Oral QHS   Chlorhexidine Gluconate Cloth  6 each Topical Daily   dexamethasone (DECADRON) injection  4 mg Intravenous Q12H   fentaNYL       ferrous sulfate  325 mg Oral Q breakfast   folic acid  1 mg Oral Daily   gabapentin  100 mg Oral QHS   heparin  5,000 Units Subcutaneous Q8H   hydrALAZINE  10 mg Oral BID   insulin aspart  0-9 Units Subcutaneous Q4H   isosorbide dinitrate  5 mg Oral BID   linaclotide  145 mcg Oral QAC breakfast   metoprolol succinate  12.5 mg Oral Daily   mupirocin ointment  1 Application Nasal BID   Continuous Infusions:  sodium chloride     ampicillin-sulbactam (UNASYN) IV 3 g (02/02/23 0319)     LOS: 5 days     Shon Hale, MD    02/02/2023, 12:36 PM

## 2023-02-02 NOTE — Progress Notes (Signed)
SLP Cancellation Note  Patient Details Name: [name] MRN: [id number] DOB: 1955-09-24   Cancelled treatment:       Reason Eval/Treat Not Completed: Medical issues which prohibited therapy;Other (comment) (Pt had PEG placed this AM and is now sedated. SLP unable to give PO trials at this time. SLP will follow.) Pt had PEG placed for encephalopathy.   Thank you,  Havery Moros, CCC-SLP [phone]  Arma Reining 02/02/2023, 3:27 PM

## 2023-02-02 NOTE — Progress Notes (Signed)
Palliative:   Attempted to see [name] earlier in the day but he was off the floor for PEG tube placement.  I returned later in the afternoon to find [name] lying quietly in bed.  He is sleeping soundly post PEG tube placement.  He does not respond to gentle voice or touch.  He cannot make his basic needs known.  There is no family at bedside at this time.  Call to daughter, [name], for update.  Brief update about PEG tube placement and plan.  Charlynne Pander states that she is in physical therapy at this time.  Conference with attending, general surgeon, bedside nursing staff, transition of care team related to patient condition, needs, goals of care, disposition.  Plan: PEG tube placed 1/23 for limited time trial 6 to 8 weeks for recovery time post CVA.  Continue to treat the treatable but no CPR or intubation.  Time for outcomes.  Anticipate return to short-term rehab at [facility].  50 minutes  Lillia Carmel, NP Palliative Medicine Team  Team Phone [phone]

## 2023-02-02 NOTE — Consult Note (Signed)
Reason for Consult: Dysphagia Referring Physician: Dr. Amado Coe [name] is an 68 y.o. male.  HPI: Patient is a 68 year old black male with multiple medical problems who suffers from encephalopathy and dysphagia.  He is unable to communicate well.  As he has dysphagia and poor oral intake, [name], patient's daughter has requested gastrostomy tube placement.  Past Medical History:  Diagnosis Date   Bulging lumbar disc    CAD (coronary artery disease) [phone]   a. prior LAD stenting. b. s/p DES to [facility] 08/2015. c. 04/2016 Cardiac cath at [facility]. Patent stent in the PLAD and RCA. Diffuse dLAD, OM2, and  RPDA disease. d. DES to PDA and distal RCA 03/2019    Chronic lower back pain    CKD (chronic kidney disease), stage II    Cocaine abuse (HCC)    Cyst of epididymis    DM2 (diabetes mellitus, type 2) (HCC)    Essential hypertension    Fournier gangrene    GERD (gastroesophageal reflux disease)    Headache    History of pneumonia    Hyperlipidemia    Ischemic cardiomyopathy    LV (left ventricular) mural thrombus    Sleep apnea     Past Surgical History:  Procedure Laterality Date   APPENDECTOMY     BIOPSY  11/11/2022   Procedure: BIOPSY;  Surgeon: Russell Macho, MD;  [facility];  Service: Endoscopy;;   BRONCHIAL NEEDLE ASPIRATION BIOPSY  11/12/2021   Procedure: BRONCHIAL NEEDLE ASPIRATION BIOPSIES;  Surgeon: Russell Person, MD;  Location: [facility] ENDOSCOPY;  Service: Pulmonary;;   CARDIAC CATHETERIZATION N/A 09/07/2015   Procedure: Left Heart Cath and Coronary Angiography;  Surgeon: Marykay Lex, MD;  Location: [facility] INVASIVE CV LAB;  Service: Cardiovascular;  Laterality: N/A;   CARDIAC CATHETERIZATION N/A 09/07/2015   Procedure: Coronary Stent Intervention;  Surgeon: Marykay Lex, MD;  Location: [facility] INVASIVE CV LAB;  Service: Cardiovascular;  Laterality: N/A;   COLONOSCOPY WITH PROPOFOL N/A 11/11/2022   Procedure: COLONOSCOPY WITH PROPOFOL;   Surgeon: Russell Macho, MD;  [facility];  Service: Endoscopy;  Laterality: N/A;   CORONARY ANGIOGRAM  09/07/13   residual RCA and OM disease   CORONARY ANGIOPLASTY WITH STENT PLACEMENT     CORONARY STENT INTERVENTION N/A 10/16/2018   Procedure: CORONARY STENT INTERVENTION;  Surgeon: Kathleene Hazel, MD;  Location: [facility] INVASIVE CV LAB;  Service: Cardiovascular;  Laterality: N/A;   CORONARY STENT INTERVENTION N/A 03/11/2019   Procedure: CORONARY STENT INTERVENTION;  Surgeon: Corky Crafts, MD;  Location: [facility] INVASIVE CV LAB;  Service: Cardiovascular;  Laterality: N/A;   ESOPHAGOGASTRODUODENOSCOPY (EGD) WITH PROPOFOL N/A 11/11/2022   Procedure: ESOPHAGOGASTRODUODENOSCOPY (EGD) WITH PROPOFOL;  Surgeon: Russell Macho, MD;  [facility];  Service: Endoscopy;  Laterality: N/A;   FRACTURE SURGERY     HEMOSTASIS CLIP PLACEMENT  11/11/2022   Procedure: HEMOSTASIS CLIP PLACEMENT;  Surgeon: Russell Macho, MD;  [facility];  Service: Endoscopy;;   INCISION AND DRAINAGE OF WOUND Left 05/19/2019   Procedure: DEBRIDEMENT LEFT GROIN;  Surgeon: Violeta Gelinas, MD;  Location: [facility] OR;  Service: General;  Laterality: Left;   INSERTION OF ILIAC STENT Left 11/30/2017   Left external illiac stent   INSERTION OF ILIAC STENT  11/30/2017   Procedure: Insertion Of Iliac Stent;  Surgeon: Runell Gess, MD;  Location: [facility] INVASIVE CV LAB;  Service: Cardiovascular;;  Left external illiac stent   KNEE  ARTHROSCOPY Left    KNEE SURGERY     "ligaments, cartilage; tendon, put a pin in" (11/30/2017)   LEFT HEART CATH Bilateral 07/08/2012   Procedure: LEFT HEART CATH;  Surgeon: Corky Crafts, MD;  Location: [facility] CATH LAB;  Service: Cardiovascular;  Laterality: Bilateral;   LEFT HEART CATH AND CORONARY ANGIOGRAPHY N/A 10/16/2018   Procedure: LEFT HEART CATH AND CORONARY ANGIOGRAPHY;  Surgeon: Kathleene Hazel, MD;  Location: [facility] INVASIVE CV LAB;  Service:  Cardiovascular;  Laterality: N/A;   LEFT HEART CATH AND CORONARY ANGIOGRAPHY N/A 03/11/2019   Procedure: LEFT HEART CATH AND CORONARY ANGIOGRAPHY;  Surgeon: Corky Crafts, MD;  Location: [facility] INVASIVE CV LAB;  Service: Cardiovascular;  Laterality: N/A;   LEFT HEART CATHETERIZATION WITH CORONARY ANGIOGRAM N/A 09/06/2013   STEMI, 2nd ISR LAD. Procedure: LEFT HEART CATHETERIZATION WITH CORONARY ANGIOGRAM;  Surgeon: Corky Crafts, MD;  Location: [facility] CATH LAB;  Service: Cardiovascular;  Laterality: N/A;   LOWER EXTREMITY ANGIOGRAPHY N/A 11/30/2017   Procedure: LOWER EXTREMITY ANGIOGRAPHY;  Surgeon: Runell Gess, MD;  Location: [facility] INVASIVE CV LAB;  Service: Cardiovascular;  Laterality: N/A;   PERCUTANEOUS CORONARY STENT INTERVENTION (PCI-S)  07/08/2012   Procedure: PERCUTANEOUS CORONARY STENT INTERVENTION (PCI-S);  Surgeon: Corky Crafts, MD;  Location: [facility] CATH LAB;  Service: Cardiovascular;;  DES LAD   PERCUTANEOUS CORONARY STENT INTERVENTION (PCI-S) N/A 09/06/2013   Procedure: PERCUTANEOUS CORONARY STENT INTERVENTION (PCI-S);  Surgeon: Corky Crafts, MD;  Location: [facility] CATH LAB;  Service: Cardiovascular;  Laterality: N/A;  Mid LAD 3.0/24mm Promus   POLYPECTOMY  11/11/2022   Procedure: POLYPECTOMY INTESTINAL;  Surgeon: Russell Macho, MD;  [facility];  Service: Endoscopy;;   SCLEROTHERAPY  11/11/2022   Procedure: Susa Day;  Surgeon: Russell Macho, MD;  [facility];  Service: Endoscopy;;   SUBMUCOSAL TATTOO INJECTION  11/11/2022   Procedure: SUBMUCOSAL TATTOO INJECTION;  Surgeon: Russell Macho, MD;  [facility];  Service: Endoscopy;;   THORACENTESIS Right 11/10/2021   Procedure: Alanson Puls;  Surgeon: Leslye Peer, MD;  Location: [facility] ENDOSCOPY;  Service: Cardiopulmonary;  Laterality: Right;   VIDEO BRONCHOSCOPY WITH ENDOBRONCHIAL ULTRASOUND Right 11/12/2021   Procedure: VIDEO BRONCHOSCOPY WITH ENDOBRONCHIAL ULTRASOUND;  Surgeon:  Russell Person, MD;  Location: [facility] ENDOSCOPY;  Service: Pulmonary;  Laterality: Right;   WOUND DEBRIDEMENT Left 05/20/2019   Procedure: DEBRIDEMENT GROIN;  Surgeon: Kinsinger, De Blanch, MD;  Location: [facility] OR;  Service: General;  Laterality: Left;   WRIST FRACTURE SURGERY Bilateral     Family History  Problem Relation Age of Onset   Hypertension Mother    Diabetes Mother     Social History:  reports that he has been smoking cigarettes. He has a 24 pack-year smoking history. He has been exposed to tobacco smoke. He has never used smokeless tobacco. He reports that he does not currently use alcohol. He reports that he does not currently use drugs after having used the following drugs: Cocaine.  Allergies:  Allergies  Allergen Reactions   Clopidogrel Other (See Comments)    Drowsy, Skin irritation    Medications: I have reviewed the patient's current medications.  Results for orders placed or performed during the hospital encounter of 01/28/23 (from the past 48 hours)  Glucose, capillary     Status: Abnormal   Collection Time: 01/31/23  7:37 AM  Result Value Ref Range   Glucose-Capillary 153 (H) 70 - 99 mg/dL    Comment: Glucose reference range  applies only to samples taken after fasting for at least 8 hours.  Uric acid     Status: Abnormal   Collection Time: 01/31/23 11:23 AM  Result Value Ref Range   Uric Acid, Serum 19.6 (H) 3.7 - 8.6 mg/dL    Comment: Performed at [facility], [address], [place], [place] [address]  Glucose, capillary     Status: Abnormal   Collection Time: 01/31/23 11:51 AM  Result Value Ref Range   Glucose-Capillary 151 (H) 70 - 99 mg/dL    Comment: Glucose reference range applies only to samples taken after fasting for at least 8 hours.  Glucose, capillary     Status: Abnormal   Collection Time: 01/31/23  3:49 PM  Result Value Ref Range   Glucose-Capillary 166 (H) 70 - 99 mg/dL    Comment: Glucose reference range applies only to samples taken  after fasting for at least 8 hours.  Glucose, capillary     Status: Abnormal   Collection Time: 01/31/23  8:45 PM  Result Value Ref Range   Glucose-Capillary 152 (H) 70 - 99 mg/dL    Comment: Glucose reference range applies only to samples taken after fasting for at least 8 hours.  Glucose, capillary     Status: Abnormal   Collection Time: 01/31/23 10:40 PM  Result Value Ref Range   Glucose-Capillary 167 (H) 70 - 99 mg/dL    Comment: Glucose reference range applies only to samples taken after fasting for at least 8 hours.  Glucose, capillary     Status: Abnormal   Collection Time: 01/31/23 11:54 PM  Result Value Ref Range   Glucose-Capillary 171 (H) 70 - 99 mg/dL    Comment: Glucose reference range applies only to samples taken after fasting for at least 8 hours.  Glucose, capillary     Status: Abnormal   Collection Time: 02/01/23  4:06 AM  Result Value Ref Range   Glucose-Capillary 152 (H) 70 - 99 mg/dL    Comment: Glucose reference range applies only to samples taken after fasting for at least 8 hours.  Basic metabolic panel     Status: Abnormal   Collection Time: 02/01/23  5:07 AM  Result Value Ref Range   Sodium 154 (H) 135 - 145 mmol/L   Potassium 4.1 3.5 - 5.1 mmol/L   Chloride 120 (H) 98 - 111 mmol/L   CO2 26 22 - 32 mmol/L   Glucose, Bld 192 (H) 70 - 99 mg/dL    Comment: Glucose reference range applies only to samples taken after fasting for at least 8 hours.   BUN 46 (H) 8 - 23 mg/dL   Creatinine, Ser 4.69 (H) 0.61 - 1.24 mg/dL   Calcium 9.2 8.9 - 62.9 mg/dL   GFR, Estimated 42 (L) >60 mL/min    Comment: (NOTE) Calculated using the CKD-EPI Creatinine Equation (2021)    Anion gap 8 5 - 15    Comment: Performed at [facility], [address] [place], [place] [address]  CBC with Differential/Platelet     Status: Abnormal   Collection Time: 02/01/23  5:07 AM  Result Value Ref Range   WBC 11.4 (H) 4.0 - 10.5 K/uL   RBC 4.05 (L) 4.22 - 5.81 MIL/uL   Hemoglobin 8.5 (L)  13.0 - 17.0 g/dL   HCT 32.4 (L) 40.1 - 02.7 %   MCV 79.0 (L) 80.0 - 100.0 fL   MCH 21.0 (L) 26.0 - 34.0 pg   MCHC 26.6 (L) 30.0 - 36.0  g/dL   RDW 29.5 (H) 62.1 - 30.8 %   Platelets 322 150 - 400 K/uL   nRBC 1.0 (H) 0.0 - 0.2 %   Neutrophils Relative % 92 %   Neutro Abs 10.4 (H) 1.7 - 7.7 K/uL   Lymphocytes Relative 4 %   Lymphs Abs 0.4 (L) 0.7 - 4.0 K/uL   Monocytes Relative 3 %   Monocytes Absolute 0.4 0.1 - 1.0 K/uL   Eosinophils Relative 0 %   Eosinophils Absolute 0.0 0.0 - 0.5 K/uL   Basophils Relative 0 %   Basophils Absolute 0.0 0.0 - 0.1 K/uL   WBC Morphology MORPHOLOGY UNREMARKABLE    RBC Morphology See Note     Comment: ANISOCYTOSIS   Smear Review PLATELETS APPEAR ADEQUATE     Comment: PLATELET COUNT CONFIRMED BY SMEAR   Immature Granulocytes 1 %   Abs Immature Granulocytes 0.11 (H) 0.00 - 0.07 K/uL   Polychromasia PRESENT    Target Cells PRESENT    Ovalocytes PRESENT     Comment: Performed at [facility], [address] [place], [place] [address]  Glucose, capillary     Status: Abnormal   Collection Time: 02/01/23  7:39 AM  Result Value Ref Range   Glucose-Capillary 161 (H) 70 - 99 mg/dL    Comment: Glucose reference range applies only to samples taken after fasting for at least 8 hours.   Comment 1 Notify RN    Comment 2 Document in Chart   Glucose, capillary     Status: Abnormal   Collection Time: 02/01/23 11:56 AM  Result Value Ref Range   Glucose-Capillary 158 (H) 70 - 99 mg/dL    Comment: Glucose reference range applies only to samples taken after fasting for at least 8 hours.   Comment 1 Notify RN    Comment 2 Document in Chart   Glucose, capillary     Status: Abnormal   Collection Time: 02/01/23  4:00 PM  Result Value Ref Range   Glucose-Capillary 157 (H) 70 - 99 mg/dL    Comment: Glucose reference range applies only to samples taken after fasting for at least 8 hours.   Comment 1 Notify RN    Comment 2 Document in Chart   Glucose, capillary      Status: Abnormal   Collection Time: 02/01/23  7:57 PM  Result Value Ref Range   Glucose-Capillary 146 (H) 70 - 99 mg/dL    Comment: Glucose reference range applies only to samples taken after fasting for at least 8 hours.   Comment 1 Notify RN    Comment 2 Document in Chart   Glucose, capillary     Status: Abnormal   Collection Time: 02/02/23 12:17 AM  Result Value Ref Range   Glucose-Capillary 159 (H) 70 - 99 mg/dL    Comment: Glucose reference range applies only to samples taken after fasting for at least 8 hours.  Basic metabolic panel     Status: Abnormal   Collection Time: 02/02/23  4:23 AM  Result Value Ref Range   Sodium 156 (H) 135 - 145 mmol/L   Potassium 4.1 3.5 - 5.1 mmol/L   Chloride 121 (H) 98 - 111 mmol/L   CO2 24 22 - 32 mmol/L   Glucose, Bld 149 (H) 70 - 99 mg/dL    Comment: Glucose reference range applies only to samples taken after fasting for at least 8 hours.   BUN 47 (H) 8 - 23 mg/dL   Creatinine, Ser 6.96 (H) 0.61 -  1.24 mg/dL   Calcium 9.2 8.9 - 40.9 mg/dL   GFR, Estimated 45 (L) >60 mL/min    Comment: (NOTE) Calculated using the CKD-EPI Creatinine Equation (2021)    Anion gap 11 5 - 15    Comment: Performed at [facility], [address] [place], [place] [address]  CBC with Differential/Platelet     Status: Abnormal   Collection Time: 02/02/23  4:23 AM  Result Value Ref Range   WBC 8.6 4.0 - 10.5 K/uL   RBC 4.10 (L) 4.22 - 5.81 MIL/uL   Hemoglobin 8.5 (L) 13.0 - 17.0 g/dL   HCT 47.8 (L) 29.5 - 62.1 %   MCV 78.5 (L) 80.0 - 100.0 fL   MCH 20.7 (L) 26.0 - 34.0 pg   MCHC 26.4 (L) 30.0 - 36.0 g/dL   RDW 30.8 (H) 65.7 - 84.6 %   Platelets 298 150 - 400 K/uL   nRBC 2.0 (H) 0.0 - 0.2 %   Neutrophils Relative % 92 %   Neutro Abs 7.9 (H) 1.7 - 7.7 K/uL   Lymphocytes Relative 4 %   Lymphs Abs 0.4 (L) 0.7 - 4.0 K/uL   Monocytes Relative 3 %   Monocytes Absolute 0.3 0.1 - 1.0 K/uL   Eosinophils Relative 0 %   Eosinophils Absolute 0.0 0.0 - 0.5 K/uL    Basophils Relative 0 %   Basophils Absolute 0.0 0.0 - 0.1 K/uL   WBC Morphology MORPHOLOGY UNREMARKABLE    RBC Morphology See Note     Comment: ANISOCYTOSIS   Smear Review PLATELETS APPEAR ADEQUATE     Comment: PLATELET COUNT CONFIRMED BY SMEAR   Immature Granulocytes 1 %   Abs Immature Granulocytes 0.07 0.00 - 0.07 K/uL   Polychromasia PRESENT    Target Cells PRESENT    Ovalocytes PRESENT     Comment: Performed at [facility], [address] [place], [place] [address]  Albumin     Status: Abnormal   Collection Time: 02/02/23  4:23 AM  Result Value Ref Range   Albumin 2.7 (L) 3.5 - 5.0 g/dL    Comment: Performed at [facility], [address] [place], [place] [address]  Phosphorus     Status: None   Collection Time: 02/02/23  4:23 AM  Result Value Ref Range   Phosphorus 3.4 2.5 - 4.6 mg/dL    Comment: Performed at [facility], [address], [place], [place] [address]  Glucose, capillary     Status: Abnormal   Collection Time: 02/02/23  4:30 AM  Result Value Ref Range   Glucose-Capillary 142 (H) 70 - 99 mg/dL    Comment: Glucose reference range applies only to samples taken after fasting for at least 8 hours.  Glucose, capillary     Status: Abnormal   Collection Time: 02/02/23  7:24 AM  Result Value Ref Range   Glucose-Capillary 152 (H) 70 - 99 mg/dL    Comment: Glucose reference range applies only to samples taken after fasting for at least 8 hours.   Comment 1 Notify RN    Comment 2 Document in Chart     EEG adult Result Date: 01/31/2023 Russell Quest, MD     01/31/2023  8:39 AM Patient Name: [name] MRN: [id number] Epilepsy Attending: Charlsie [name] Referring Physician/Provider: Cleora Fleet, MD Date: 01/30/2023 Duration: 22.52 mins Patient history: 68yo M with ams getting eeg to evaluate for seizure Level of alertness: Awake AEDs during EEG study: GBP Technical aspects: This EEG study  was done with scalp electrodes positioned according to the 10-20  International system of electrode placement. Electrical activity was reviewed with band pass filter of 1-70Hz , sensitivity of 7 uV/mm, display speed of 1mm/sec with a 60Hz  notched filter applied as appropriate. EEG data were recorded continuously and digitally stored.  Video monitoring was available and reviewed as appropriate. Description: EEG showed continuous generalized polymorphic 3 to 6 Hz theta-delta slowing. Hyperventilation and photic stimulation were not performed.   ABNORMALITY - Continuous slow, generalized IMPRESSION: This study is suggestive of moderate diffuse encephalopathy. No seizures or epileptiform discharges were seen throughout the recording. [name]    ROS:  Review of systems not obtained due to patient factors.  Blood pressure 128/77, pulse 86, temperature 97.6 F (36.4 C), temperature source Oral, resp. rate 20, height 5\' 9"  (1.753 m), weight 94.2 kg, SpO2 99%. Physical Exam: Black male lying in bed noncommunicative Head is normocephalic, atraumatic Lungs clear to auscultation with equal breath sounds bilaterally Heart examination reveals regular rate and rhythm without S3, S4, murmurs Abdomen soft, nontender, nondistended.  Previous CT scan of the abdomen reviewed  Assessment/Plan: Impression: Encephalopathy with dysphagia and poor oral intake Plan: Patient scheduled for EGD with PEG today.  The risks and benefits of the procedure were fully explained to the patient's family, who gave informed consent for the patient as the patient is encephalopathic.  [name] 02/02/2023, 7:27 AM

## 2023-02-02 NOTE — H&P (View-Only) (Signed)
Reason for Consult: Dysphagia Referring Physician: Dr. Amado Coe [name] is an 68 y.o. male.  HPI: Patient is a 68 year old black male with multiple medical problems who suffers from encephalopathy and dysphagia.  He is unable to communicate well.  As he has dysphagia and poor oral intake, [name], patient's daughter has requested gastrostomy tube placement.  Past Medical History:  Diagnosis Date   Bulging lumbar disc    CAD (coronary artery disease) [phone]   a. prior LAD stenting. b. s/p DES to [facility] 08/2015. c. 04/2016 Cardiac cath at [facility]. Patent stent in the PLAD and RCA. Diffuse dLAD, OM2, and  RPDA disease. d. DES to PDA and distal RCA 03/2019    Chronic lower back pain    CKD (chronic kidney disease), stage II    Cocaine abuse (HCC)    Cyst of epididymis    DM2 (diabetes mellitus, type 2) (HCC)    Essential hypertension    Fournier gangrene    GERD (gastroesophageal reflux disease)    Headache    History of pneumonia    Hyperlipidemia    Ischemic cardiomyopathy    LV (left ventricular) mural thrombus    Sleep apnea     Past Surgical History:  Procedure Laterality Date   APPENDECTOMY     BIOPSY  11/11/2022   Procedure: BIOPSY;  Surgeon: Franky Macho, MD;  [facility];  Service: Endoscopy;;   BRONCHIAL NEEDLE ASPIRATION BIOPSY  11/12/2021   Procedure: BRONCHIAL NEEDLE ASPIRATION BIOPSIES;  Surgeon: Omar Person, MD;  Location: [facility] ENDOSCOPY;  Service: Pulmonary;;   CARDIAC CATHETERIZATION N/A 09/07/2015   Procedure: Left Heart Cath and Coronary Angiography;  Surgeon: Marykay Lex, MD;  Location: [facility] INVASIVE CV LAB;  Service: Cardiovascular;  Laterality: N/A;   CARDIAC CATHETERIZATION N/A 09/07/2015   Procedure: Coronary Stent Intervention;  Surgeon: Marykay Lex, MD;  Location: [facility] INVASIVE CV LAB;  Service: Cardiovascular;  Laterality: N/A;   COLONOSCOPY WITH PROPOFOL N/A 11/11/2022   Procedure: COLONOSCOPY WITH PROPOFOL;   Surgeon: Franky Macho, MD;  [facility];  Service: Endoscopy;  Laterality: N/A;   CORONARY ANGIOGRAM  09/07/13   residual RCA and OM disease   CORONARY ANGIOPLASTY WITH STENT PLACEMENT     CORONARY STENT INTERVENTION N/A 10/16/2018   Procedure: CORONARY STENT INTERVENTION;  Surgeon: Kathleene Hazel, MD;  Location: [facility] INVASIVE CV LAB;  Service: Cardiovascular;  Laterality: N/A;   CORONARY STENT INTERVENTION N/A 03/11/2019   Procedure: CORONARY STENT INTERVENTION;  Surgeon: Corky Crafts, MD;  Location: [facility] INVASIVE CV LAB;  Service: Cardiovascular;  Laterality: N/A;   ESOPHAGOGASTRODUODENOSCOPY (EGD) WITH PROPOFOL N/A 11/11/2022   Procedure: ESOPHAGOGASTRODUODENOSCOPY (EGD) WITH PROPOFOL;  Surgeon: Franky Macho, MD;  [facility];  Service: Endoscopy;  Laterality: N/A;   FRACTURE SURGERY     HEMOSTASIS CLIP PLACEMENT  11/11/2022   Procedure: HEMOSTASIS CLIP PLACEMENT;  Surgeon: Franky Macho, MD;  [facility];  Service: Endoscopy;;   INCISION AND DRAINAGE OF WOUND Left 05/19/2019   Procedure: DEBRIDEMENT LEFT GROIN;  Surgeon: Violeta Gelinas, MD;  Location: [facility] OR;  Service: General;  Laterality: Left;   INSERTION OF ILIAC STENT Left 11/30/2017   Left external illiac stent   INSERTION OF ILIAC STENT  11/30/2017   Procedure: Insertion Of Iliac Stent;  Surgeon: Runell Gess, MD;  Location: [facility] INVASIVE CV LAB;  Service: Cardiovascular;;  Left external illiac stent   KNEE  ARTHROSCOPY Left    KNEE SURGERY     "ligaments, cartilage; tendon, put a pin in" (11/30/2017)   LEFT HEART CATH Bilateral 07/08/2012   Procedure: LEFT HEART CATH;  Surgeon: Corky Crafts, MD;  Location: [facility] CATH LAB;  Service: Cardiovascular;  Laterality: Bilateral;   LEFT HEART CATH AND CORONARY ANGIOGRAPHY N/A 10/16/2018   Procedure: LEFT HEART CATH AND CORONARY ANGIOGRAPHY;  Surgeon: Kathleene Hazel, MD;  Location: [facility] INVASIVE CV LAB;  Service:  Cardiovascular;  Laterality: N/A;   LEFT HEART CATH AND CORONARY ANGIOGRAPHY N/A 03/11/2019   Procedure: LEFT HEART CATH AND CORONARY ANGIOGRAPHY;  Surgeon: Corky Crafts, MD;  Location: [facility] INVASIVE CV LAB;  Service: Cardiovascular;  Laterality: N/A;   LEFT HEART CATHETERIZATION WITH CORONARY ANGIOGRAM N/A 09/06/2013   STEMI, 2nd ISR LAD. Procedure: LEFT HEART CATHETERIZATION WITH CORONARY ANGIOGRAM;  Surgeon: Corky Crafts, MD;  Location: [facility] CATH LAB;  Service: Cardiovascular;  Laterality: N/A;   LOWER EXTREMITY ANGIOGRAPHY N/A 11/30/2017   Procedure: LOWER EXTREMITY ANGIOGRAPHY;  Surgeon: Runell Gess, MD;  Location: [facility] INVASIVE CV LAB;  Service: Cardiovascular;  Laterality: N/A;   PERCUTANEOUS CORONARY STENT INTERVENTION (PCI-S)  07/08/2012   Procedure: PERCUTANEOUS CORONARY STENT INTERVENTION (PCI-S);  Surgeon: Corky Crafts, MD;  Location: [facility] CATH LAB;  Service: Cardiovascular;;  DES LAD   PERCUTANEOUS CORONARY STENT INTERVENTION (PCI-S) N/A 09/06/2013   Procedure: PERCUTANEOUS CORONARY STENT INTERVENTION (PCI-S);  Surgeon: Corky Crafts, MD;  Location: [facility] CATH LAB;  Service: Cardiovascular;  Laterality: N/A;  Mid LAD 3.0/24mm Promus   POLYPECTOMY  11/11/2022   Procedure: POLYPECTOMY INTESTINAL;  Surgeon: Franky Macho, MD;  [facility];  Service: Endoscopy;;   SCLEROTHERAPY  11/11/2022   Procedure: Susa Day;  Surgeon: Franky Macho, MD;  [facility];  Service: Endoscopy;;   SUBMUCOSAL TATTOO INJECTION  11/11/2022   Procedure: SUBMUCOSAL TATTOO INJECTION;  Surgeon: Franky Macho, MD;  [facility];  Service: Endoscopy;;   THORACENTESIS Right 11/10/2021   Procedure: Alanson Puls;  Surgeon: Leslye Peer, MD;  Location: [facility] ENDOSCOPY;  Service: Cardiopulmonary;  Laterality: Right;   VIDEO BRONCHOSCOPY WITH ENDOBRONCHIAL ULTRASOUND Right 11/12/2021   Procedure: VIDEO BRONCHOSCOPY WITH ENDOBRONCHIAL ULTRASOUND;  Surgeon:  Omar Person, MD;  Location: [facility] ENDOSCOPY;  Service: Pulmonary;  Laterality: Right;   WOUND DEBRIDEMENT Left 05/20/2019   Procedure: DEBRIDEMENT GROIN;  Surgeon: Kinsinger, De Blanch, MD;  Location: [facility] OR;  Service: General;  Laterality: Left;   WRIST FRACTURE SURGERY Bilateral     Family History  Problem Relation Age of Onset   Hypertension Mother    Diabetes Mother     Social History:  reports that he has been smoking cigarettes. He has a 24 pack-year smoking history. He has been exposed to tobacco smoke. He has never used smokeless tobacco. He reports that he does not currently use alcohol. He reports that he does not currently use drugs after having used the following drugs: Cocaine.  Allergies:  Allergies  Allergen Reactions   Clopidogrel Other (See Comments)    Drowsy, Skin irritation    Medications: I have reviewed the patient's current medications.  Results for orders placed or performed during the hospital encounter of 01/28/23 (from the past 48 hours)  Glucose, capillary     Status: Abnormal   Collection Time: 01/31/23  7:37 AM  Result Value Ref Range   Glucose-Capillary 153 (H) 70 - 99 mg/dL    Comment: Glucose reference range  applies only to samples taken after fasting for at least 8 hours.  Uric acid     Status: Abnormal   Collection Time: 01/31/23 11:23 AM  Result Value Ref Range   Uric Acid, Serum 19.6 (H) 3.7 - 8.6 mg/dL    Comment: Performed at [facility], [address], [place], [place] [address]  Glucose, capillary     Status: Abnormal   Collection Time: 01/31/23 11:51 AM  Result Value Ref Range   Glucose-Capillary 151 (H) 70 - 99 mg/dL    Comment: Glucose reference range applies only to samples taken after fasting for at least 8 hours.  Glucose, capillary     Status: Abnormal   Collection Time: 01/31/23  3:49 PM  Result Value Ref Range   Glucose-Capillary 166 (H) 70 - 99 mg/dL    Comment: Glucose reference range applies only to samples taken  after fasting for at least 8 hours.  Glucose, capillary     Status: Abnormal   Collection Time: 01/31/23  8:45 PM  Result Value Ref Range   Glucose-Capillary 152 (H) 70 - 99 mg/dL    Comment: Glucose reference range applies only to samples taken after fasting for at least 8 hours.  Glucose, capillary     Status: Abnormal   Collection Time: 01/31/23 10:40 PM  Result Value Ref Range   Glucose-Capillary 167 (H) 70 - 99 mg/dL    Comment: Glucose reference range applies only to samples taken after fasting for at least 8 hours.  Glucose, capillary     Status: Abnormal   Collection Time: 01/31/23 11:54 PM  Result Value Ref Range   Glucose-Capillary 171 (H) 70 - 99 mg/dL    Comment: Glucose reference range applies only to samples taken after fasting for at least 8 hours.  Glucose, capillary     Status: Abnormal   Collection Time: 02/01/23  4:06 AM  Result Value Ref Range   Glucose-Capillary 152 (H) 70 - 99 mg/dL    Comment: Glucose reference range applies only to samples taken after fasting for at least 8 hours.  Basic metabolic panel     Status: Abnormal   Collection Time: 02/01/23  5:07 AM  Result Value Ref Range   Sodium 154 (H) 135 - 145 mmol/L   Potassium 4.1 3.5 - 5.1 mmol/L   Chloride 120 (H) 98 - 111 mmol/L   CO2 26 22 - 32 mmol/L   Glucose, Bld 192 (H) 70 - 99 mg/dL    Comment: Glucose reference range applies only to samples taken after fasting for at least 8 hours.   BUN 46 (H) 8 - 23 mg/dL   Creatinine, Ser 4.69 (H) 0.61 - 1.24 mg/dL   Calcium 9.2 8.9 - 62.9 mg/dL   GFR, Estimated 42 (L) >60 mL/min    Comment: (NOTE) Calculated using the CKD-EPI Creatinine Equation (2021)    Anion gap 8 5 - 15    Comment: Performed at [facility], [address] [place], [place] [address]  CBC with Differential/Platelet     Status: Abnormal   Collection Time: 02/01/23  5:07 AM  Result Value Ref Range   WBC 11.4 (H) 4.0 - 10.5 K/uL   RBC 4.05 (L) 4.22 - 5.81 MIL/uL   Hemoglobin 8.5 (L)  13.0 - 17.0 g/dL   HCT 32.4 (L) 40.1 - 02.7 %   MCV 79.0 (L) 80.0 - 100.0 fL   MCH 21.0 (L) 26.0 - 34.0 pg   MCHC 26.6 (L) 30.0 - 36.0  g/dL   RDW 29.5 (H) 62.1 - 30.8 %   Platelets 322 150 - 400 K/uL   nRBC 1.0 (H) 0.0 - 0.2 %   Neutrophils Relative % 92 %   Neutro Abs 10.4 (H) 1.7 - 7.7 K/uL   Lymphocytes Relative 4 %   Lymphs Abs 0.4 (L) 0.7 - 4.0 K/uL   Monocytes Relative 3 %   Monocytes Absolute 0.4 0.1 - 1.0 K/uL   Eosinophils Relative 0 %   Eosinophils Absolute 0.0 0.0 - 0.5 K/uL   Basophils Relative 0 %   Basophils Absolute 0.0 0.0 - 0.1 K/uL   WBC Morphology MORPHOLOGY UNREMARKABLE    RBC Morphology See Note     Comment: ANISOCYTOSIS   Smear Review PLATELETS APPEAR ADEQUATE     Comment: PLATELET COUNT CONFIRMED BY SMEAR   Immature Granulocytes 1 %   Abs Immature Granulocytes 0.11 (H) 0.00 - 0.07 K/uL   Polychromasia PRESENT    Target Cells PRESENT    Ovalocytes PRESENT     Comment: Performed at [facility], [address] [place], [place] [address]  Glucose, capillary     Status: Abnormal   Collection Time: 02/01/23  7:39 AM  Result Value Ref Range   Glucose-Capillary 161 (H) 70 - 99 mg/dL    Comment: Glucose reference range applies only to samples taken after fasting for at least 8 hours.   Comment 1 Notify RN    Comment 2 Document in Chart   Glucose, capillary     Status: Abnormal   Collection Time: 02/01/23 11:56 AM  Result Value Ref Range   Glucose-Capillary 158 (H) 70 - 99 mg/dL    Comment: Glucose reference range applies only to samples taken after fasting for at least 8 hours.   Comment 1 Notify RN    Comment 2 Document in Chart   Glucose, capillary     Status: Abnormal   Collection Time: 02/01/23  4:00 PM  Result Value Ref Range   Glucose-Capillary 157 (H) 70 - 99 mg/dL    Comment: Glucose reference range applies only to samples taken after fasting for at least 8 hours.   Comment 1 Notify RN    Comment 2 Document in Chart   Glucose, capillary      Status: Abnormal   Collection Time: 02/01/23  7:57 PM  Result Value Ref Range   Glucose-Capillary 146 (H) 70 - 99 mg/dL    Comment: Glucose reference range applies only to samples taken after fasting for at least 8 hours.   Comment 1 Notify RN    Comment 2 Document in Chart   Glucose, capillary     Status: Abnormal   Collection Time: 02/02/23 12:17 AM  Result Value Ref Range   Glucose-Capillary 159 (H) 70 - 99 mg/dL    Comment: Glucose reference range applies only to samples taken after fasting for at least 8 hours.  Basic metabolic panel     Status: Abnormal   Collection Time: 02/02/23  4:23 AM  Result Value Ref Range   Sodium 156 (H) 135 - 145 mmol/L   Potassium 4.1 3.5 - 5.1 mmol/L   Chloride 121 (H) 98 - 111 mmol/L   CO2 24 22 - 32 mmol/L   Glucose, Bld 149 (H) 70 - 99 mg/dL    Comment: Glucose reference range applies only to samples taken after fasting for at least 8 hours.   BUN 47 (H) 8 - 23 mg/dL   Creatinine, Ser 6.96 (H) 0.61 -  1.24 mg/dL   Calcium 9.2 8.9 - 40.9 mg/dL   GFR, Estimated 45 (L) >60 mL/min    Comment: (NOTE) Calculated using the CKD-EPI Creatinine Equation (2021)    Anion gap 11 5 - 15    Comment: Performed at [facility], [address] [place], [place] [address]  CBC with Differential/Platelet     Status: Abnormal   Collection Time: 02/02/23  4:23 AM  Result Value Ref Range   WBC 8.6 4.0 - 10.5 K/uL   RBC 4.10 (L) 4.22 - 5.81 MIL/uL   Hemoglobin 8.5 (L) 13.0 - 17.0 g/dL   HCT 47.8 (L) 29.5 - 62.1 %   MCV 78.5 (L) 80.0 - 100.0 fL   MCH 20.7 (L) 26.0 - 34.0 pg   MCHC 26.4 (L) 30.0 - 36.0 g/dL   RDW 30.8 (H) 65.7 - 84.6 %   Platelets 298 150 - 400 K/uL   nRBC 2.0 (H) 0.0 - 0.2 %   Neutrophils Relative % 92 %   Neutro Abs 7.9 (H) 1.7 - 7.7 K/uL   Lymphocytes Relative 4 %   Lymphs Abs 0.4 (L) 0.7 - 4.0 K/uL   Monocytes Relative 3 %   Monocytes Absolute 0.3 0.1 - 1.0 K/uL   Eosinophils Relative 0 %   Eosinophils Absolute 0.0 0.0 - 0.5 K/uL    Basophils Relative 0 %   Basophils Absolute 0.0 0.0 - 0.1 K/uL   WBC Morphology MORPHOLOGY UNREMARKABLE    RBC Morphology See Note     Comment: ANISOCYTOSIS   Smear Review PLATELETS APPEAR ADEQUATE     Comment: PLATELET COUNT CONFIRMED BY SMEAR   Immature Granulocytes 1 %   Abs Immature Granulocytes 0.07 0.00 - 0.07 K/uL   Polychromasia PRESENT    Target Cells PRESENT    Ovalocytes PRESENT     Comment: Performed at [facility], [address] [place], [place] [address]  Albumin     Status: Abnormal   Collection Time: 02/02/23  4:23 AM  Result Value Ref Range   Albumin 2.7 (L) 3.5 - 5.0 g/dL    Comment: Performed at [facility], [address] [place], [place] [address]  Phosphorus     Status: None   Collection Time: 02/02/23  4:23 AM  Result Value Ref Range   Phosphorus 3.4 2.5 - 4.6 mg/dL    Comment: Performed at [facility], [address], [place], [place] [address]  Glucose, capillary     Status: Abnormal   Collection Time: 02/02/23  4:30 AM  Result Value Ref Range   Glucose-Capillary 142 (H) 70 - 99 mg/dL    Comment: Glucose reference range applies only to samples taken after fasting for at least 8 hours.  Glucose, capillary     Status: Abnormal   Collection Time: 02/02/23  7:24 AM  Result Value Ref Range   Glucose-Capillary 152 (H) 70 - 99 mg/dL    Comment: Glucose reference range applies only to samples taken after fasting for at least 8 hours.   Comment 1 Notify RN    Comment 2 Document in Chart     EEG adult Result Date: 01/31/2023 Charlsie Quest, MD     01/31/2023  8:39 AM Patient Name: [name] MRN: [id number] Epilepsy Attending: Charlsie Quest Referring Physician/Provider: Cleora Fleet, MD Date: 01/30/2023 Duration: 22.52 mins Patient history: 68yo M with ams getting eeg to evaluate for seizure Level of alertness: Awake AEDs during EEG study: GBP Technical aspects: This EEG study  was done with scalp electrodes positioned according to the 10-20  International system of electrode placement. Electrical activity was reviewed with band pass filter of 1-70Hz , sensitivity of 7 uV/mm, display speed of 1mm/sec with a 60Hz  notched filter applied as appropriate. EEG data were recorded continuously and digitally stored.  Video monitoring was available and reviewed as appropriate. Description: EEG showed continuous generalized polymorphic 3 to 6 Hz theta-delta slowing. Hyperventilation and photic stimulation were not performed.   ABNORMALITY - Continuous slow, generalized IMPRESSION: This study is suggestive of moderate diffuse encephalopathy. No seizures or epileptiform discharges were seen throughout the recording. Priyanka O Yadav    ROS:  Review of systems not obtained due to patient factors.  Blood pressure 128/77, pulse 86, temperature 97.6 F (36.4 C), temperature source Oral, resp. rate 20, height 5\' 9"  (1.753 m), weight 94.2 kg, SpO2 99%. Physical Exam: Black male lying in bed noncommunicative Head is normocephalic, atraumatic Lungs clear to auscultation with equal breath sounds bilaterally Heart examination reveals regular rate and rhythm without S3, S4, murmurs Abdomen soft, nontender, nondistended.  Previous CT scan of the abdomen reviewed  Assessment/Plan: Impression: Encephalopathy with dysphagia and poor oral intake Plan: Patient scheduled for EGD with PEG today.  The risks and benefits of the procedure were fully explained to the patient's family, who gave informed consent for the patient as the patient is encephalopathic.  Franky Macho 02/02/2023, 7:27 AM

## 2023-02-02 NOTE — Progress Notes (Signed)
Returned from peg placement surgery around 1200. Abd binder in place to keep patient from pulling tube out.  Bloody drainage on abd binder marked and drain sponge replaced after cleaning site.  No further bloody drainage at this point.  Patient agitated and given ativan and dilaudid.  Tube feeding started at 20/hour with tube meds and flushes given with no difficulty.  Mitts on hands to prevent pulling.  Malewick draining urine. Not talking.  Daughter called for update. Had been on O2 after surgery but now 100% on room air

## 2023-02-02 NOTE — Transfer of Care (Signed)
Immediate Anesthesia Transfer of Care Note  Patient: MYA CASERTA  Procedure(s) Performed: PERCUTANEOUS ENDOSCOPIC GASTROSTOMY (PEG) PLACEMENT (Abdomen) ESOPHAGOGASTRODUODENOSCOPY (EGD) WITH PROPOFOL (Mouth)  Patient Location: PACU  Anesthesia Type:General  Level of Consciousness: awake, drowsy, and patient cooperative  Airway & Oxygen Therapy: Patient Spontanous Breathing and Patient connected to face mask oxygen  Post-op Assessment: Report given to RN, Post -op Vital signs reviewed and stable, and Patient moving all extremities X 4  Post vital signs: Reviewed and stable  Last Vitals:  Vitals Value Taken Time  BP 100/77 02/02/23 1122  Temp 36.3 C 02/02/23 1122  Pulse 82 02/02/23 1127  Resp 26 02/02/23 1127  SpO2 91 % 02/02/23 1127  Vitals shown include unfiled device data.  Last Pain:  Vitals:   02/02/23 0432  TempSrc: Oral  PainSc:          Complications: No notable events documented.

## 2023-02-02 NOTE — Anesthesia Preprocedure Evaluation (Signed)
Anesthesia Evaluation  Patient identified by MRN, date of birth, ID band Patient awake    Reviewed: Allergy & Precautions, H&P , NPO status , Patient's Chart, lab work & pertinent test results, reviewed documented beta blocker date and time   Airway Mallampati: II  TM Distance: >3 FB Neck ROM: full    Dental no notable dental hx.    Pulmonary neg pulmonary ROS, sleep apnea , Current Smoker   Pulmonary exam normal breath sounds clear to auscultation       Cardiovascular Exercise Tolerance: Good hypertension, + CAD, + Peripheral Vascular Disease and +CHF  negative cardio ROS  Rhythm:regular Rate:Normal     Neuro/Psych  Headaches CVA negative neurological ROS  negative psych ROS   GI/Hepatic negative GI ROS, Neg liver ROS,GERD  ,,  Endo/Other  negative endocrine ROSdiabetes    Renal/GU Renal diseasenegative Renal ROS  negative genitourinary   Musculoskeletal   Abdominal   Peds  Hematology negative hematology ROS (+)   Anesthesia Other Findings Electrolyte abnormalities acute on chronic.    Reproductive/Obstetrics negative OB ROS                             Anesthesia Physical Anesthesia Plan  ASA: 4  Anesthesia Plan: General   Post-op Pain Management:    Induction:   PONV Risk Score and Plan: Propofol infusion  Airway Management Planned:   Additional Equipment:   Intra-op Plan:   Post-operative Plan:   Informed Consent: I have reviewed the patients History and Physical, chart, labs and discussed the procedure including the risks, benefits and alternatives for the proposed anesthesia with the patient or authorized representative who has indicated his/her understanding and acceptance.     Dental Advisory Given  Plan Discussed with: CRNA  Anesthesia Plan Comments:        Anesthesia Quick Evaluation

## 2023-02-02 NOTE — Progress Notes (Signed)
PT Cancellation Note  Patient Details Name: [name] MRN: [id number] DOB: 06-04-1955   Cancelled Treatment:    Reason Eval/Treat Not Completed: Fatigue/lethargy limiting ability to participate.  Therapy held due to patient lethargic and unable to participate.  Will check back tomorrow.  3:21 PM, 02/02/23 [name], MPT Physical Therapist with [facility] 336 [phone] office [phone] mobile phone

## 2023-02-03 ENCOUNTER — Inpatient Hospital Stay (HOSPITAL_COMMUNITY): Payer: No Typology Code available for payment source

## 2023-02-03 ENCOUNTER — Encounter (HOSPITAL_COMMUNITY): Payer: Self-pay | Admitting: General Surgery

## 2023-02-03 DIAGNOSIS — I6389 Other cerebral infarction: Secondary | ICD-10-CM

## 2023-02-03 DIAGNOSIS — I10 Essential (primary) hypertension: Secondary | ICD-10-CM

## 2023-02-03 DIAGNOSIS — I69391 Dysphagia following cerebral infarction: Secondary | ICD-10-CM

## 2023-02-03 DIAGNOSIS — G9341 Metabolic encephalopathy: Secondary | ICD-10-CM | POA: Diagnosis not present

## 2023-02-03 DIAGNOSIS — I255 Ischemic cardiomyopathy: Secondary | ICD-10-CM | POA: Diagnosis not present

## 2023-02-03 LAB — GLUCOSE, CAPILLARY
Glucose-Capillary: 166 mg/dL — ABNORMAL HIGH (ref 70–99)
Glucose-Capillary: 198 mg/dL — ABNORMAL HIGH (ref 70–99)
Glucose-Capillary: 203 mg/dL — ABNORMAL HIGH (ref 70–99)
Glucose-Capillary: 213 mg/dL — ABNORMAL HIGH (ref 70–99)
Glucose-Capillary: 219 mg/dL — ABNORMAL HIGH (ref 70–99)
Glucose-Capillary: 222 mg/dL — ABNORMAL HIGH (ref 70–99)

## 2023-02-03 LAB — BASIC METABOLIC PANEL
Anion gap: 11 (ref 5–15)
BUN: 61 mg/dL — ABNORMAL HIGH (ref 8–23)
CO2: 24 mmol/L (ref 22–32)
Calcium: 8.8 mg/dL — ABNORMAL LOW (ref 8.9–10.3)
Chloride: 118 mmol/L — ABNORMAL HIGH (ref 98–111)
Creatinine, Ser: 1.83 mg/dL — ABNORMAL HIGH (ref 0.61–1.24)
GFR, Estimated: 40 mL/min — ABNORMAL LOW (ref >60)
Glucose, Bld: 219 mg/dL — ABNORMAL HIGH (ref 70–99)
Potassium: 4.1 mmol/L (ref 3.5–5.1)
Sodium: 153 mmol/L — ABNORMAL HIGH (ref 135–145)

## 2023-02-03 LAB — MAGNESIUM
Magnesium: 2.8 mg/dL — ABNORMAL HIGH (ref 1.7–2.4)
Magnesium: 2.7 mg/dL — ABNORMAL HIGH (ref 1.7–2.4)

## 2023-02-03 LAB — PHOSPHORUS
Phosphorus: 3.7 mg/dL (ref 2.5–4.6)
Phosphorus: 3.8 mg/dL (ref 2.5–4.6)

## 2023-02-03 MED ORDER — METOCLOPRAMIDE HCL 5 MG/ML IJ SOLN
5.0000 mg | Freq: Four times a day (QID) | INTRAMUSCULAR | Status: DC
  Administered 2023-02-03 – 2023-02-04 (×5): 5 mg via INTRAVENOUS
  Filled 2023-02-03 (×5): qty 2

## 2023-02-03 MED ORDER — BISACODYL 10 MG RE SUPP
10.0000 mg | Freq: Once | RECTAL | Status: AC
  Administered 2023-02-03: 10 mg via RECTAL
  Filled 2023-02-03: qty 1

## 2023-02-03 MED ORDER — FREE WATER
400.0000 mL | Status: DC
Start: 2023-02-03 — End: 2023-02-06
  Administered 2023-02-03 – 2023-02-06 (×23): 400 mL

## 2023-02-03 NOTE — Progress Notes (Signed)
Physical Therapy Treatment Patient Details Name: [name] MRN: [id number] DOB: September 07, 1955 Today's Date: 02/03/2023   History of Present Illness [name] is a 68 year old male with chronic combined systolic and diastolic heart failure, hyperlipidemia, type 2 diabetes mellitus, diabetic polyneuropathy, hypertension, polysubstance abuse, stage III CKD history of cocaine abuse, failure to thrive, coronary artery disease, ischemic cardiomyopathy, history of LV mural thrombus, OSA, history of Fournier gangrene, GERD, who was recently hospitalized for acute heart failure and discharged to [facility]     He was sent to the ED by EMS for lethargic and confusion since about 7 PM that evening.  He reportedly has normally been alert and oriented.  He was not answering questions.  Stroke was suspected and he was seen by teleneurologist.  TNK was not given as he was outside the window.  Neurology recommended MRI brain and routine stroke workup.  They recommended aspirin 325 mg daily.    PT Comments  Patient demonstrates slow labored movement for sitting up at bedside, once seated had frequent posterior leaning, required active assistance for completing BLE exercises and unable to stand or transfer due to weakness.  Patient required Max assist for repositioning when put back to bed. Patient will benefit from continued skilled physical therapy in hospital and recommended venue below to increase strength, balance, endurance for safe ADLs and gait.      If plan is discharge home, recommend the following: A lot of help with bathing/dressing/bathroom;A lot of help with walking and/or transfers;Help with stairs or ramp for entrance;Assistance with cooking/housework   Can travel by private vehicle     No  Equipment Recommendations  None recommended by PT    Recommendations for Other Services       Precautions / Restrictions Precautions Precautions: Fall Restrictions Weight Bearing  Restrictions Per Provider Order: No     Mobility  Bed Mobility Overal bed mobility: Needs Assistance Bed Mobility: Supine to Sit, Sit to Supine       Sit to supine: Max assist   General bed mobility comments: slow labored movement with frequent posterior leaning once seated    Transfers                        Ambulation/Gait                   Stairs             Wheelchair Mobility     Tilt Bed    Modified Rankin (Stroke Patients Only)       Balance Overall balance assessment: Needs assistance Sitting-balance support: Feet supported, No upper extremity supported Sitting balance-Leahy Scale: Poor Sitting balance - Comments: fair/poor seated at EOB Postural control: Posterior lean                                  Cognition Arousal: Alert Behavior During Therapy: Flat affect Overall Cognitive Status: Impaired/Different from baseline                                 General Comments: Frequent verbal cuing and repeating of directions        Exercises General Exercises - Lower Extremity Long Arc Quad: Seated, AAROM, Strengthening, Both, 10 reps Hip Flexion/Marching: Seated, AAROM, Strengthening, Both, 10 reps    General Comments  Pertinent Vitals/Pain Pain Assessment Pain Assessment: Faces Faces Pain Scale: Hurts little more Pain Location: left foot/ankle Pain Descriptors / Indicators: Grimacing, Guarding, Sore Pain Intervention(s): Limited activity within patient's tolerance, Monitored during session, Repositioned    Home Living                          Prior Function            PT Goals (current goals can now be found in the care plan section) Acute Rehab PT Goals Patient Stated Goal: not stated PT Goal Formulation: With patient Time For Goal Achievement: 02/12/23 Potential to Achieve Goals: Fair Progress towards PT goals: Progressing toward goals    Frequency    Min  3X/week      PT Plan      Co-evaluation              AM-PAC PT "6 Clicks" Mobility   Outcome Measure  Help needed turning from your back to your side while in a flat bed without using bedrails?: A Lot Help needed moving from lying on your back to sitting on the side of a flat bed without using bedrails?: A Lot Help needed moving to and from a bed to a chair (including a wheelchair)?: Total Help needed standing up from a chair using your arms (e.g., wheelchair or bedside chair)?: Total Help needed to walk in hospital room?: Total Help needed climbing 3-5 steps with a railing? : Total 6 Click Score: 8    End of Session   Activity Tolerance: Patient tolerated treatment well;Patient limited by fatigue;Patient limited by lethargy Patient left: in bed;with call bell/phone within reach;with bed alarm set Nurse Communication: Mobility status PT Visit Diagnosis: Unsteadiness on feet (R26.81);Other abnormalities of gait and mobility (R26.89);Muscle weakness (generalized) (M62.81)     Time: 0981-1914 PT Time Calculation (min) (ACUTE ONLY): 30 min  Charges:    $Therapeutic Exercise: 8-22 mins $Therapeutic Activity: 8-22 mins PT General Charges $$ ACUTE PT VISIT: 1 Visit                     12:25 PM, 02/03/23 [name], MPT Physical Therapist with [facility] 336 [phone] office [phone] mobile phone

## 2023-02-03 NOTE — Progress Notes (Signed)
Patient rested on and off through the night. Tube feed increased as order, patient tolerated well. Patient presented with signs of pain, PRN pain meds given through the night and were effective. Patient also presented with some signs of agitation, PRN Ativan given and effective. Patient lying in bed at this time resting.

## 2023-02-03 NOTE — Progress Notes (Signed)
PROGRESS NOTE   [name]  WUJ:[id number] DOB: March 22, 1955 DOA: 01/28/2023 PCP: Benetta Spar, MD   Chief Complaint  Patient presents with   Altered Mental Status   Level of care: Telemetry  Brief Admission History:  68 year old male with chronic combined systolic and diastolic heart failure, hyperlipidemia, type 2 diabetes mellitus, diabetic polyneuropathy, hypertension, polysubstance abuse, stage III CKD history of cocaine abuse, failure to thrive, coronary artery disease, ischemic cardiomyopathy, history of LV mural thrombus, OSA, history of Fournier gangrene, GERD, who was recently hospitalized for acute heart failure and discharged to [facility].  He was sent to the ED by EMS for lethargic and confusion since about 7 PM that evening.  He reportedly has normally been alert and oriented.  He was not answering questions.  Stroke was suspected and he was seen by teleneurologist.  TNK was not given as he was outside the window.  Neurology recommended MRI brain and routine stroke workup.  They recommended aspirin 325 mg daily.   Assessment and Plan:  1)Acute ischemic CVA with Dysphagia - MRI brain positive for small acute infarct in the left insular white matter -Neurology consult appreciated -A1c 7.2, LDL 29 -TTE with bubble study without evidence of LV thrombus, EF is 25 to 30% with wall motion abnormalities, Agitated saline contrast bubble study was negative, with no evidence of any interatrial shunt , no mitral stenosis, no aortic stenosis - c/n  ASA 81 mg daily and atorvastatin for secondary prevention  2)Meningioma in the Right Middle Cranial Fossa with Edema in the adjacent Temporal Lobe-- - Given his persistent encephalopathy, neurosurgeon Dr. Danielle Dess on 01/30/23 - Recommend trial of dexamethasone to see if patient improves.   -He recommended 4 mg BID.  If patient improves on this, he said to discharge patient on oral decadron and follow up with him in the office  to discuss surgical removal of the meningioma.  -- 3)Dysphagia with Swallowing Difficulties/Nutrition--- due to #1 #2 above -Speech therapy appreciated -Status post PEG tube placement on 02/02/2023 -Dietitian consult for tube feeding requested  4)HyperNatremia/Hyperchloremia--- pt has free water deficits due to Stroke Related Dysphagia Increase Free water flush to 400 ml q 3 hrs  5)Acute Encephalopathy -due to #1 #2 and # 4 above -Neurology Consult appreciated - EEG no epileptic activity seen; c/w encephalopathy -Neurology, palliative care and neurosurgery input appreciated --please see above  6)AKI----acute kidney injury on CKD stage -3B  - Creatinine remains elevated above baseline -Should improve with increased water flushes and tube feeding---hydration - renally adjust medications, avoid nephrotoxic agents / dehydration  / hypotension   7)Acute respiratory failure with hypoxia due to Aspiration pneumonia - c/n  IV ampicillin sulbactam , may switch to Augmentin via PEG tube in 1 to 2 days -Continue bronchodilators and mucolytics - continue bipap QHS   8)DM2 A1c 7.2 reflecting uncontrolled DM with hyperglycemia PTA Use Novolog/Humalog Sliding scale insulin with Accu-Cheks/Fingersticks as ordered   9)History of polysubstance abuse including cocaine in the past - he is currently residing in a SNF - UDS negative    10)Normocytic Anemia in the setting of underlying CKD 3B - -Hgb currently greater than 8 -No bleeding concerns monitor closely and transfuse as clinically indicated    11)Hypernatremia/hyperchloremia-- - -poor oral intake in the setting of acute stroke with dysphagia -Continue IV fluids until able to adequately give free water through PEG tube -PEG tube placed on 02/02/2023  12)PAD/CAD--status post prior angioplasty and stent placement -Continue aspirin, atorvastatin and metoprolol  13)HFrEF--chronic systolic dysfunction CHF---??  If some component of ischemic  cardiomyopathy TTE with bubble study without evidence of LV thrombus, EF is 25 to 30% with wall motion abnormalities, Agitated saline contrast bubble study was negative, with no evidence of any interatrial shunt , no mitral stenosis, no aortic stenosis The apex is aneurysmal. The entire inferior wall, apical lateral segment,  apical septal segment, and apical anterior segment are akinetic. The  anterior wall, antero-lateral wall, anterior septum, mid inferoseptal segment, and  basal inferoseptal segment are hypokinetic.  -give Toprol-XL  avoid ACEI/ARB/ARNI and  aldactone due to renal concerns Ok to use Hydralazine/Isosordil combo in lieu of ACEI/ARB -Home torsemide on hold due to AKI and dehydration concerns  14)Social/ethics--- palliative care consult appreciated patient is a DNR/DNI   DVT prophylaxis: sq heparin  Code Status: DNR/DNI Family Communication: daughter is primary contact Disposition: anticipate SNF rehab    Consultants:  Neurology  Palliative care General Surgery for PEG tube placement on 02/02/2023  Procedures:  PEG tube 01/30/2023    Subjective: -Tolerating Peg tube feeding well Sleepy, but arousable No fever   No emesis   Objective: Vitals:   02/02/23 1941 02/03/23 0003 02/03/23 0324 02/03/23 0500  BP: 122/83 (!) 116/94 116/79   Pulse: 91 85 89   Resp: 20 20 18    Temp: 97.6 F (36.4 C) 98.6 F (37 C) (!) 97.3 F (36.3 C)   TempSrc: Oral  Oral   SpO2: 100% 98% 100%   Weight:    95 kg  Height:        Intake/Output Summary (Last 24 hours) at 02/03/2023 1314 Last data filed at 02/03/2023 0932 Gross per 24 hour  Intake 1585.7 ml  Output 1900 ml  Net -314.3 ml   Filed Weights   02/01/23 0500 02/02/23 0500 02/03/23 0500  Weight: 94.8 kg 94.2 kg 95 kg   Physical Exam  Gen:- Sleepy, but easily arousable , in no acute distress  HEENT:- [place].AT, No sclera icterus Neck-Supple Neck,No JVD,.  Lungs-  CTAB , fair air movement bilaterally  CV- S1, S2  normal, RRR Abd-  +ve B.Sounds, Abd Soft, No tenderness, PEG tube in situ Extremity/Skin:- +ve  edema,   good pedal pulses , tenderness on palpation of feet and ankles, no obvious erythema no significant warmth or streaking Psych-occasional confusional episodes with disorientation persist  Neuro-improving left-sided hemiparesis , speech and swallowing difficulties persist  Data Reviewed: I have personally reviewed following labs and imaging studies  CBC: Recent Labs  Lab 01/29/23 0451 01/30/23 0429 01/31/23 0413 02/01/23 0507 02/02/23 0423  WBC 24.1* 15.7* 14.2* 11.4* 8.6  NEUTROABS 21.8* 13.5* 13.0* 10.4* 7.9*  HGB 8.0* 8.0* 8.2* 8.5* 8.5*  HCT 30.2* 29.4* 31.6* 32.0* 32.2*  MCV 79.3* 77.6* 79.0* 79.0* 78.5*  PLT 359 340 315 322 298    Basic Metabolic Panel: Recent Labs  Lab 01/29/23 0451 01/30/23 0429 01/31/23 0413 02/01/23 0507 02/02/23 0423 02/03/23 0412  NA 141 144 152* 154* 156* 153*  K 4.0 3.9 4.1 4.1 4.1 4.1  CL 108 110 118* 120* 121* 118*  CO2 23 23 24 26 24 24   GLUCOSE 117* 147* 159* 192* 149* 219*  BUN 67* 71* 57* 46* 47* 61*  CREATININE 2.97* 2.80* 2.19* 1.73* 1.64* 1.83*  CALCIUM 8.3* 8.6* 8.7* 9.2 9.2 8.8*  MG 2.4  --   --   --   --  2.8*  PHOS  --   --   --   --  3.4  3.7    CBG: Recent Labs  Lab 02/02/23 1726 02/02/23 2007 02/03/23 0000 02/03/23 0320 02/03/23 0741  GLUCAP 147* 169* 166* 203* 219*    Recent Results (from the past 240 hours)  Culture, blood (Routine X 2) w Reflex to ID Panel     Status: None   Collection Time: 01/28/23  9:39 AM   Specimen: BLOOD  Result Value Ref Range Status   Specimen Description BLOOD BLOOD RIGHT ARM  Final   Special Requests   Final    BOTTLES DRAWN AEROBIC AND ANAEROBIC Blood Culture adequate volume   Culture   Final    NO GROWTH 5 DAYS Performed at [facility], [address], [place], [place] [address]    Report Status 02/02/2023 FINAL  Final  Culture, blood (Routine X 2) w Reflex to ID Panel      Status: None   Collection Time: 01/28/23  9:46 AM   Specimen: BLOOD  Result Value Ref Range Status   Specimen Description BLOOD LEFT ANTECUBITAL  Final   Special Requests   Final    BOTTLES DRAWN AEROBIC AND ANAEROBIC Blood Culture adequate volume   Culture   Final    NO GROWTH 5 DAYS Performed at [facility], [address] [place], [place] [address]    Report Status 02/02/2023 FINAL  Final  MRSA Next Gen by PCR, Nasal     Status: Abnormal   Collection Time: 01/28/23 10:42 AM   Specimen: Nasal Mucosa; Nasal Swab  Result Value Ref Range Status   MRSA by PCR Next Gen DETECTED (A) NOT DETECTED Final    Comment: RESULT CALLED TO, READ BACK BY AND VERIFIED WITH: IRVING,L ON 01/28/23 AT 2125 BY PURDIE,J        The GeneXpert MRSA Assay (FDA approved for NASAL specimens only), is one component of a comprehensive MRSA colonization surveillance program. It is not intended to diagnose MRSA infection nor to guide or monitor treatment for MRSA infections. Performed at [facility], [address], [place], [place] [address]      Radiology Studies: No results found.   Scheduled Meds:  allopurinol  100 mg Oral Daily   aspirin  300 mg Rectal Daily   Or   aspirin  81 mg Oral Daily   atorvastatin  40 mg Oral QHS   Chlorhexidine Gluconate Cloth  6 each Topical Daily   dexamethasone (DECADRON) injection  4 mg Intravenous Q12H   feeding supplement (PROSource TF20)  60 mL Per Tube BID   ferrous sulfate  325 mg Oral Q breakfast   folic acid  1 mg Oral Daily   free water  400 mL Per Tube Q3H   gabapentin  100 mg Oral QHS   heparin  5,000 Units Subcutaneous Q8H   hydrALAZINE  10 mg Oral BID   insulin aspart  0-9 Units Subcutaneous Q4H   isosorbide dinitrate  5 mg Oral BID   linaclotide  145 mcg Oral QAC breakfast   metoprolol succinate  12.5 mg Oral Daily   multivitamin with minerals  1 tablet Per Tube Daily   neomycin-bacitracin-polymyxin  1 Application Topical Daily   Continuous  Infusions:  feeding supplement (JEVITY 1.5 CAL/FIBER) 40 mL/hr at 02/03/23 0630     LOS: 6 days    Shon Hale, MD    02/03/2023, 1:14 PM

## 2023-02-03 NOTE — Progress Notes (Signed)
SLP Cancellation Note  Patient Details Name: [name] MRN: [id number] DOB: 08-Mar-1955   Cancelled treatment:       Reason Eval/Treat Not Completed: Other (comment). Pt was lethargic, is continuing to "refuse PO" --Pt is cognitively inappropriate for PO trials at this time. PEG was placed yesterday. ST will continue efforts, thank you,  [name], CCC-SLP Speech Language Pathologist    [name] 02/03/2023, 11:18 AM

## 2023-02-03 NOTE — Progress Notes (Signed)
1 Day Post-Op  Subjective: Patient resting comfortably.  Objective: Vital signs in last 24 hours: Temp:  [97.3 F (36.3 C)-98.6 F (37 C)] 97.3 F (36.3 C) (01/24 0324) Pulse Rate:  [82-92] 89 (01/24 0324) Resp:  [14-36] 18 (01/24 0324) BP: (100-138)/(77-113) 116/79 (01/24 0324) SpO2:  [69 %-100 %] 100 % (01/24 0324) Weight:  [95 kg] 95 kg (01/24 0500) Last BM Date : 02/01/23  Intake/Output from previous day: 01/23 0701 - 01/24 0700 In: 1785.7 [I.V.:1306.9; NG/GT:378.8; IV Piggyback:100] Out: 1900 [Urine:1900] Intake/Output this shift: No intake/output data recorded.  General appearance: no distress GI: Soft, G-tube in appropriate position.  Appears to be functioning well.  Lab Results:  Recent Labs    02/01/23 0507 02/02/23 0423  WBC 11.4* 8.6  HGB 8.5* 8.5*  HCT 32.0* 32.2*  PLT 322 298   BMET Recent Labs    02/02/23 0423 02/03/23 0412  NA 156* 153*  K 4.1 4.1  CL 121* 118*  CO2 24 24  GLUCOSE 149* 219*  BUN 47* 61*  CREATININE 1.64* 1.83*  CALCIUM 9.2 8.8*   PT/INR No results for input(s): "LABPROT", "INR" in the last 72 hours.  Studies/Results: No results found.  Anti-infectives: Anti-infectives (From admission, onward)    Start     Dose/Rate Route Frequency Ordered Stop   02/02/23 0945  ceFAZolin (ANCEF) IVPB 2g/100 mL premix        2 g 200 mL/hr over 30 Minutes Intravenous On call to O.R. 02/02/23 0848 02/02/23 1122   02/02/23 0903  ceFAZolin (ANCEF) 2-4 GM/100ML-% IVPB       Note to Pharmacy: [facility] S: cabinet override      02/02/23 0903 02/02/23 1127   01/31/23 1900  Ampicillin-Sulbactam (UNASYN) 3 g in sodium chloride 0.9 % 100 mL IVPB        3 g 200 mL/hr over 30 Minutes Intravenous Every 8 hours 01/31/23 1213 02/02/23 1859   01/28/23 1100  Ampicillin-Sulbactam (UNASYN) 3 g in sodium chloride 0.9 % 100 mL IVPB  Status:  Discontinued        3 g 200 mL/hr over 30 Minutes Intravenous Every 12 hours 01/28/23 1013 01/31/23 1213        Assessment/Plan: s/p Procedure(s): PERCUTANEOUS ENDOSCOPIC GASTROSTOMY (PEG) PLACEMENT ESOPHAGOGASTRODUODENOSCOPY (EGD) WITH PROPOFOL Impression: Stable on postoperative day 1, status post PEG placement.  Please call me if I can be of further assistance.  LOS: 6 days    Franky Macho 02/03/2023

## 2023-02-03 NOTE — Progress Notes (Signed)
High Tube feeding residual-----will hold tube feeding and start Reglan  Shon Hale, MD

## 2023-02-03 NOTE — Plan of Care (Signed)
Problem: Education: Goal: Knowledge of General Education information will improve Description: Including pain rating scale, medication(s)/side effects and non-pharmacologic comfort measures Outcome: Not Progressing   Problem: Health Behavior/Discharge Planning: Goal: Ability to manage health-related needs will improve Outcome: Not Progressing

## 2023-02-03 NOTE — Care Management Important Message (Signed)
Important Message  Patient Details  Name: [name] MRN: [id number] Date of Birth: Nov 15, 1955   Important Message Given:  Yes - Medicare IM     Corey Harold 02/03/2023, 2:37 PM

## 2023-02-04 DIAGNOSIS — I6329 Cerebral infarction due to unspecified occlusion or stenosis of other precerebral arteries: Secondary | ICD-10-CM | POA: Diagnosis not present

## 2023-02-04 DIAGNOSIS — I10 Essential (primary) hypertension: Secondary | ICD-10-CM | POA: Diagnosis not present

## 2023-02-04 DIAGNOSIS — F17200 Nicotine dependence, unspecified, uncomplicated: Secondary | ICD-10-CM

## 2023-02-04 DIAGNOSIS — G9341 Metabolic encephalopathy: Secondary | ICD-10-CM | POA: Diagnosis not present

## 2023-02-04 LAB — BLOOD GAS, ARTERIAL
Acid-Base Excess: 8.1 mmol/L — ABNORMAL HIGH (ref 0.0–2.0)
Bicarbonate: 33.4 mmol/L — ABNORMAL HIGH (ref 20.0–28.0)
Drawn by: 22179
O2 Saturation: 95.7 %
Patient temperature: 37
pCO2 arterial: 48 mm[Hg] (ref 32–48)
pH, Arterial: 7.45 (ref 7.35–7.45)
pO2, Arterial: 70 mm[Hg] — ABNORMAL LOW (ref 83–108)

## 2023-02-04 LAB — COMPREHENSIVE METABOLIC PANEL
ALT: 29 U/L (ref 0–44)
AST: 35 U/L (ref 15–41)
Albumin: 2.5 g/dL — ABNORMAL LOW (ref 3.5–5.0)
Alkaline Phosphatase: 89 U/L (ref 38–126)
Anion gap: 10 (ref 5–15)
BUN: 74 mg/dL — ABNORMAL HIGH (ref 8–23)
CO2: 27 mmol/L (ref 22–32)
Calcium: 8.7 mg/dL — ABNORMAL LOW (ref 8.9–10.3)
Chloride: 119 mmol/L — ABNORMAL HIGH (ref 98–111)
Creatinine, Ser: 1.84 mg/dL — ABNORMAL HIGH (ref 0.61–1.24)
GFR, Estimated: 39 mL/min — ABNORMAL LOW (ref >60)
Glucose, Bld: 291 mg/dL — ABNORMAL HIGH (ref 70–99)
Potassium: 4.1 mmol/L (ref 3.5–5.1)
Sodium: 156 mmol/L — ABNORMAL HIGH (ref 135–145)
Total Bilirubin: 2.6 mg/dL — ABNORMAL HIGH (ref 0.0–1.2)
Total Protein: 7 g/dL (ref 6.5–8.1)

## 2023-02-04 LAB — HEMOGLOBIN AND HEMATOCRIT, BLOOD
HCT: 30.9 % — ABNORMAL LOW (ref 39.0–52.0)
Hemoglobin: 8.2 g/dL — ABNORMAL LOW (ref 13.0–17.0)

## 2023-02-04 LAB — GLUCOSE, CAPILLARY
Glucose-Capillary: 191 mg/dL — ABNORMAL HIGH (ref 70–99)
Glucose-Capillary: 227 mg/dL — ABNORMAL HIGH (ref 70–99)
Glucose-Capillary: 263 mg/dL — ABNORMAL HIGH (ref 70–99)
Glucose-Capillary: 235 mg/dL — ABNORMAL HIGH (ref 70–99)
Glucose-Capillary: 213 mg/dL — ABNORMAL HIGH (ref 70–99)

## 2023-02-04 LAB — MAGNESIUM: Magnesium: 2.9 mg/dL — ABNORMAL HIGH (ref 1.7–2.4)

## 2023-02-04 LAB — AMMONIA: Ammonia: 25 umol/L (ref 9–35)

## 2023-02-04 LAB — PHOSPHORUS: Phosphorus: 3.7 mg/dL (ref 2.5–4.6)

## 2023-02-04 MED ORDER — BISACODYL 10 MG RE SUPP
10.0000 mg | Freq: Once | RECTAL | Status: AC
  Administered 2023-02-04: 10 mg via RECTAL
  Filled 2023-02-04: qty 1

## 2023-02-04 MED ORDER — GABAPENTIN 250 MG/5ML PO SOLN
100.0000 mg | Freq: Every day | ORAL | Status: DC
  Administered 2023-02-04 – 2023-02-14 (×11): 100 mg
  Filled 2023-02-04 (×11): qty 2

## 2023-02-04 MED ORDER — FERROUS SULFATE 75 (15 FE) MG/ML PO SOLN
325.0000 mg | Freq: Every day | ORAL | Status: DC
  Filled 2023-02-04 (×2): qty 4.3

## 2023-02-04 MED ORDER — METOCLOPRAMIDE HCL 5 MG/ML IJ SOLN
10.0000 mg | Freq: Four times a day (QID) | INTRAMUSCULAR | Status: AC
  Administered 2023-02-05 – 2023-02-06 (×7): 10 mg via INTRAVENOUS
  Filled 2023-02-04 (×7): qty 2

## 2023-02-04 MED ORDER — METOPROLOL TARTRATE 25 MG PO TABS
25.0000 mg | ORAL_TABLET | Freq: Two times a day (BID) | ORAL | Status: DC
  Administered 2023-02-04 – 2023-02-05 (×3): 25 mg
  Filled 2023-02-04 (×4): qty 1

## 2023-02-04 MED ORDER — GABAPENTIN 100 MG PO CAPS: 100.0000 mg | ORAL_CAPSULE | Freq: Every day | ORAL | Status: DC

## 2023-02-04 MED ORDER — HYDRALAZINE HCL 25 MG PO TABS
25.0000 mg | ORAL_TABLET | Freq: Two times a day (BID) | ORAL | Status: DC
  Administered 2023-02-04 – 2023-02-05 (×3): 25 mg
  Filled 2023-02-04 (×4): qty 1

## 2023-02-04 MED ORDER — JEVITY 1.5 CAL/FIBER PO LIQD
1000.0000 mL | ORAL | Status: DC
Start: 2023-02-04 — End: 2023-02-09
  Administered 2023-02-04 – 2023-02-09 (×5): 1000 mL
  Filled 2023-02-04 (×3): qty 1000

## 2023-02-04 MED ORDER — ATORVASTATIN CALCIUM 40 MG PO TABS
40.0000 mg | ORAL_TABLET | Freq: Every day | ORAL | Status: DC
  Administered 2023-02-04 – 2023-02-14 (×11): 40 mg
  Filled 2023-02-04 (×11): qty 1

## 2023-02-04 MED ORDER — BISACODYL 10 MG RE SUPP: 10.0000 mg | Freq: Once | RECTAL | Status: AC

## 2023-02-04 MED ORDER — ASPIRIN 300 MG RE SUPP
300.0000 mg | Freq: Every day | RECTAL | Status: DC
Start: 2023-02-05 — End: 2023-02-07
  Administered 2023-02-07: 300 mg via RECTAL
  Filled 2023-02-04: qty 1

## 2023-02-04 MED ORDER — FOLIC ACID 1 MG PO TABS
1.0000 mg | ORAL_TABLET | Freq: Every day | ORAL | Status: DC
  Administered 2023-02-05 – 2023-02-14 (×10): 1 mg
  Filled 2023-02-04 (×10): qty 1

## 2023-02-04 MED ORDER — ASPIRIN 81 MG PO CHEW
81.0000 mg | CHEWABLE_TABLET | Freq: Every day | ORAL | Status: DC
Start: 2023-02-05 — End: 2023-02-07
  Administered 2023-02-05 – 2023-02-06 (×2): 81 mg
  Filled 2023-02-04 (×3): qty 1

## 2023-02-04 MED ORDER — LINACLOTIDE 145 MCG PO CAPS
290.0000 ug | ORAL_CAPSULE | Freq: Every day | ORAL | Status: DC
  Administered 2023-02-04 – 2023-02-14 (×10): 290 ug
  Filled 2023-02-04 (×10): qty 2

## 2023-02-04 MED ORDER — ISOSORBIDE DINITRATE 10 MG PO TABS
5.0000 mg | ORAL_TABLET | Freq: Two times a day (BID) | ORAL | Status: DC
Start: 2023-02-04 — End: 2023-02-06
  Administered 2023-02-04 – 2023-02-06 (×4): 5 mg
  Filled 2023-02-04 (×4): qty 1

## 2023-02-04 NOTE — Plan of Care (Signed)
  Problem: Clinical Measurements: Goal: Ability to maintain clinical measurements within normal limits will improve 02/04/2023 0547 by Beatris Ship, LPN Outcome: Progressing 02/04/2023 0352 by Beatris Ship, LPN Outcome: Progressing Goal: Will remain free from infection 02/04/2023 0547 by Beatris Ship, LPN Outcome: Progressing 02/04/2023 0352 by Beatris Ship, LPN Outcome: Progressing Goal: Diagnostic test results will improve 02/04/2023 0547 by Beatris Ship, LPN Outcome: Progressing 02/04/2023 0352 by Beatris Ship, LPN Outcome: Progressing Goal: Respiratory complications will improve 02/04/2023 0547 by Beatris Ship, LPN Outcome: Progressing 02/04/2023 0352 by Beatris Ship, LPN Outcome: Progressing Goal: Cardiovascular complication will be avoided Outcome: Progressing   Problem: Elimination: Goal: Will not experience complications related to bowel motility 02/04/2023 0547 by Beatris Ship, LPN Outcome: Progressing 02/04/2023 0352 by Beatris Ship, LPN Outcome: Progressing Goal: Will not experience complications related to urinary retention 02/04/2023 0547 by Beatris Ship, LPN Outcome: Progressing 02/04/2023 0352 by Beatris Ship, LPN Outcome: Progressing   Problem: Safety: Goal: Ability to remain free from injury will improve 02/04/2023 0547 by Beatris Ship, LPN Outcome: Progressing 02/04/2023 0352 by Beatris Ship, LPN Outcome: Progressing   Problem: Skin Integrity: Goal: Risk for impaired skin integrity will decrease 02/04/2023 0547 by Beatris Ship, LPN Outcome: Progressing 02/04/2023 0352 by Beatris Ship, LPN Outcome: Progressing   Problem: Education: Goal: Ability to describe self-care measures that may prevent or decrease complications (Diabetes Survival Skills Education) will improve Outcome: Progressing Goal: Individualized Educational Video(s) Outcome: Progressing   Problem:  Coping: Goal: Ability to adjust to condition or change in health will improve Outcome: Progressing   Problem: Health Behavior/Discharge Planning: Goal: Ability to manage health-related needs will improve Outcome: Progressing   Problem: Metabolic: Goal: Ability to maintain appropriate glucose levels will improve Outcome: Progressing   Problem: Nutritional: Goal: Maintenance of adequate nutrition will improve Outcome: Progressing   Problem: Skin Integrity: Goal: Risk for impaired skin integrity will decrease Outcome: Progressing   Problem: Tissue Perfusion: Goal: Adequacy of tissue perfusion will improve Outcome: Progressing   Problem: Coping: Goal: Will verbalize positive feelings about self Outcome: Progressing

## 2023-02-04 NOTE — Progress Notes (Signed)
MD wants ABG checked RT informed.

## 2023-02-04 NOTE — Progress Notes (Signed)
Bladder scan at 1830 showed , patient urinated thus far on shift since receiving him at 1600.

## 2023-02-04 NOTE — TOC Progression Note (Signed)
Transition of Care [facility]) - Progression Note    Patient Details  Name: [name] MRN: [id number] Date of Birth: [birth year]  Transition of Care [facility]) CM/SW Contact  Catalina Gravel, [place] Phone Number: 02/04/2023, 2:05 PM  Clinical Narrative:    Pt requires additional care, moved to ICU. Pt accepted for return to [facility] pending, no other bed offers or pending offers at this time. TOC to follow.   Expected Discharge Plan: Skilled Nursing Facility Barriers to Discharge: Continued Medical Work up  Expected Discharge Plan and Services In-house Referral: Clinical Social Work Discharge Planning Services: CM Consult Post Acute Care Choice: Skilled Nursing Facility Living arrangements for the past 2 months: Skilled Nursing Facility                                       Social Determinants of Health (SDOH) Interventions SDOH Screenings   Food Insecurity: Patient Unable To Answer (01/28/2023)  Housing: Patient Unable To Answer (01/28/2023)  Transportation Needs: Patient Unable To Answer (01/28/2023)  Utilities: Patient Unable To Answer (01/28/2023)  Alcohol Screen: Low Risk  (12/14/2022)  Depression (PHQ2-9): Low Risk  (12/14/2022)  Financial Resource Strain: Low Risk  (12/14/2022)  Physical Activity: Inactive (12/14/2022)  Social Connections: Patient Unable To Answer (01/28/2023)  Stress: Stress Concern Present (12/14/2022)  Tobacco Use: High Risk (01/31/2023)  Health Literacy: Adequate Health Literacy (12/14/2022)    Readmission Risk Interventions    01/29/2023   11:31 AM 12/19/2022   10:56 AM 11/04/2022   10:22 AM  Readmission Risk Prevention Plan  Transportation Screening Complete Complete Complete  HRI or Home Care Consult   Complete  Social Work Consult for Recovery Care Planning/Counseling   Complete  Palliative Care Screening   Not Applicable  Medication Review Oceanographer) Complete Complete Complete  HRI or Home Care Consult Complete Complete   SW  Recovery Care/Counseling Consult Complete Complete   Palliative Care Screening Not Applicable Not Applicable   Skilled Nursing Facility Complete Not Applicable

## 2023-02-04 NOTE — Progress Notes (Signed)
   02/04/23 0826  Assess: MEWS Score  BP (!) 97/54  MAP (mmHg) 67  Pulse Rate 83  Resp (!) 30  Level of Consciousness Responds to Voice  SpO2 (!) 88 %  O2 Device Nasal Cannula  O2 Flow Rate (L/min) 2 L/min  Assess: MEWS Score  MEWS Temp 0  MEWS Systolic 1  MEWS Pulse 0  MEWS RR 2  MEWS LOC 1  MEWS Score 4  MEWS Score Color Red  Assess: if the MEWS score is Yellow or Red  Were vital signs accurate and taken at a resting state? Yes  Does the patient meet 2 or more of the SIRS criteria? Yes  MEWS guidelines implemented  Yes, red  Treat  MEWS Interventions Considered administering scheduled or prn medications/treatments as ordered  Take Vital Signs  Increase Vital Sign Frequency  Red: Q1hr x2, continue Q4hrs until patient remains green for 12hrs  Escalate  MEWS: Escalate Red: Discuss with charge nurse and notify provider. Consider notifying RRT. If remains red for 2 hours consider need for higher level of care  Notify: Charge Nurse/RN  Name of Charge Nurse/RN Catering manager  Provider Notification  Provider Name/Title Courage MD  Date Provider Notified 02/04/23  Method of Notification Page (secure chat)  Notification Reason Other (Comment) (red mews)  Provider response  (hold fentanyl)  Date of Provider Response 02/04/23  Time of Provider Response 0830  Assess: SIRS CRITERIA  SIRS Temperature  0  SIRS Respirations  1  SIRS Pulse 0  SIRS WBC 0  SIRS Score Sum  1

## 2023-02-04 NOTE — Progress Notes (Signed)
  Interdisciplinary Goals of Care Family Meeting   Date carried out: 02/04/2023  Location of the meeting: Phone conference  Member's involved: Physician and Family Member or next of kin --Daughter [name]----[phone] Durable Power of Insurance risk surveyor: Daughter  -Daughter [name]----[phone]  Discussion: We discussed goals of care for [name] .   -- Patient with acute infarct in the left insular white matter (MRI Brain 01/28/23) -Known meningioma in the right middle cranial fossa with edema in the adjacent temporal lobe (MRI Brain 01/28/23) -Stroke related dysphagia--status post PEG tube placement on 02/02/23 -Poor tolerance of tube feeding with high residuals -Persistent hypernatremia due to free water deficit in the setting of lack of oral intake -- Discussed with patient's daughter that overall prognosis is poor -- [name] verbalizes understanding of overall poor prognosis -She requested will continue current level of care -Patient remains DNR/DNI -Patient daughter will consider de-escalation to hospice if patient does Not improve over the next several days -If patient improves over the next several days he may be able to go to rehab  Code status:   Code Status: Limited: Do not attempt resuscitation (DNR) -DNR-LIMITED -Do Not Intubate/DNI    Disposition: Continue current acute care  Time spent for the meeting: 37 min  Shon Hale, MD  02/04/2023, 12:37 PM

## 2023-02-04 NOTE — Plan of Care (Signed)
  Problem: Clinical Measurements: Goal: Ability to maintain clinical measurements within normal limits will improve Outcome: Progressing Goal: Will remain free from infection Outcome: Progressing Goal: Diagnostic test results will improve Outcome: Progressing Goal: Respiratory complications will improve Outcome: Progressing   Problem: Nutrition: Goal: Adequate nutrition will be maintained Outcome: Progressing   Problem: Coping: Goal: Level of anxiety will decrease Outcome: Progressing   Problem: Elimination: Goal: Will not experience complications related to bowel motility Outcome: Progressing Goal: Will not experience complications related to urinary retention Outcome: Progressing   Problem: Pain Managment: Goal: General experience of comfort will improve and/or be controlled Outcome: Progressing   Problem: Safety: Goal: Ability to remain free from injury will improve Outcome: Progressing   Problem: Skin Integrity: Goal: Risk for impaired skin integrity will decrease Outcome: Progressing

## 2023-02-04 NOTE — Progress Notes (Signed)
   02/04/23 1028  Vitals  Temp (!) 97.5 F (36.4 C)  Temp Source Axillary  BP 117/80  MAP (mmHg) 93  BP Location Left Arm  BP Method Automatic  Patient Position (if appropriate) Lying  Pulse Rate 80  Pulse Rate Source Dinam[place]  Resp (!) 25  Level of Consciousness  Level of Consciousness Responds to Pain  MEWS COLOR  MEWS Score Color Yellow  Oxygen Therapy  SpO2 90 %  O2 Device Nasal Cannula  O2 Flow Rate (L/min) 3 L/min  MEWS Score  MEWS Temp 0  MEWS Systolic 0  MEWS Pulse 0  MEWS RR 1  MEWS LOC 2  MEWS Score 3   Repeat assessment vitals as above upon entering room patient had pulled O2 off sat reading between 70-mid 80's reapplied 2L sats remained in low to mid 80's increased O2 to 3L sats returned to 90%. MD Courage on unit and informed of updated patient status. States current status is unchanged from prior shifts and no new care orders. Continue tube feedings @ 86ml/hr.

## 2023-02-04 NOTE — Plan of Care (Signed)
Problem: Education: Goal: Knowledge of General Education information will improve Description: Including pain rating scale, medication(s)/side effects and non-pharmacologic comfort measures Outcome: Progressing   Problem: Elimination: Goal: Will not experience complications related to urinary retention Outcome: Progressing   Problem: Pain Managment: Goal: General experience of comfort will improve and/or be controlled Outcome: Progressing

## 2023-02-04 NOTE — Progress Notes (Signed)
PROGRESS NOTE   [name]  JXB:[id number] DOB: 09-20-1955 DOA: 01/28/2023 PCP: Benetta Spar, MD   Chief Complaint  Patient presents with   Altered Mental Status   Level of care: Stepdown  Brief Admission History:  68 year old male with chronic combined systolic and diastolic heart failure, hyperlipidemia, type 2 diabetes mellitus, diabetic polyneuropathy, hypertension, polysubstance abuse, stage III CKD history of cocaine abuse, failure to thrive, coronary artery disease, ischemic cardiomyopathy, history of LV mural thrombus, OSA, history of Fournier gangrene, GERD, who was recently hospitalized for acute heart failure and discharged to [facility].  He was sent to the ED by EMS for lethargic and confusion since about 7 PM that evening.  He reportedly has normally been alert and oriented.  He was not answering questions.  Stroke was suspected and he was seen by teleneurologist.  TNK was not given as he was outside the window.  Neurology recommended MRI brain and routine stroke workup.  They recommended aspirin 325 mg daily.   Assessment and Plan:  1)Acute ischemic CVA with Dysphagia - MRI brain positive for small acute infarct in the left insular white matter -Neurology consult appreciated -A1c 7.2, LDL 29 -TTE with bubble study without evidence of LV thrombus, EF is 25 to 30% with wall motion abnormalities, Agitated saline contrast bubble study was negative, with no evidence of any interatrial shunt , no mitral stenosis, no aortic stenosis - c/n  ASA 81 mg daily and atorvastatin for secondary prevention  2)Meningioma in the Right Middle Cranial Fossa with Edema in the adjacent Temporal Lobe-- - Given his persistent encephalopathy, neurosurgeon Dr. Danielle Dess on 01/30/23 - Recommend trial of dexamethasone to see if patient improves.   -He recommended 4 mg BID.  If patient improves on this, he said to discharge patient on oral decadron and follow up with him in the office  to discuss surgical removal of the meningioma.   -- 3)Acute Encephalopathy -due to #1 #2 and # 4 above -Neurology Consult appreciated - EEG no epileptic activity seen; c/w encephalopathy -Neurology, palliative care and neurosurgery input appreciated --please see above 02/04/23 -Serum ammonia is not elevated -ABG without elevated CO2 --Mentation remains poor, hypernatremia is probably contributory -- 4)HyperNatremia/Hyperchloremia--- pt has free water deficits due to Stroke Related Dysphagia -Continue  Free water flush to 400 ml q 3 hrs -Repeat BMP  5)Dysphagia with Swallowing Difficulties/Nutrition--- due to #1 #2 above -Speech therapy appreciated -Status post PEG tube placement on 02/02/2023 02/04/23 -High Tube feeding residual-----started Reglan  -Abdominal x-rays on 02/03/2023 demonstrates PEG tube in expected position -Dietitian consult for tube feeding appreciated  6)AKI----acute kidney injury on CKD stage -3B  - Creatinine remains elevated above baseline -Should improve with increased water flushes and tube feeding---hydration - renally adjust medications, avoid nephrotoxic agents / dehydration  / hypotension   7)Acute respiratory failure with hypoxia due to Aspiration pneumonia - c/n  IV ampicillin sulbactam , may switch to Augmentin via PEG tube in 1 to 2 days -Continue bronchodilators and mucolytics - continue bipap QHS   8)DM2 A1c 7.2 reflecting uncontrolled DM with hyperglycemia PTA Use Novolog/Humalog Sliding scale insulin with Accu-Cheks/Fingersticks as ordered   9)History of polysubstance abuse including cocaine in the past - he is currently residing in a SNF - UDS negative    10)Normocytic Anemia in the setting of underlying CKD 3B - -Hgb currently greater than 8 -No bleeding concerns monitor closely and transfuse as clinically indicated    11)Hypernatremia/hyperchloremia-- - -poor oral intake in the  setting of acute stroke with dysphagia -Continue IV fluids  until able to adequately give free water through PEG tube -PEG tube placed on 02/02/2023  12)PAD/CAD--status post prior angioplasty and stent placement -Continue aspirin, atorvastatin and metoprolol  13)HFrEF--chronic systolic dysfunction CHF---??  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 The entire inferior wall, apical lateral segment,  apical septal segment, and apical anterior segment are akinetic. The  anterior wall, antero-lateral wall, anterior septum, mid inferoseptal segment, and  basal inferoseptal segment are hypokinetic.  -give Toprol-XL  avoid ACEI/ARB/ARNI and  aldactone due to renal concerns Ok to use Hydralazine/Isosordil combo in lieu of ACEI/ARB -Home torsemide on hold due to AKI and dehydration concerns  14)Social/ethics--- palliative care consult appreciated patient is a DNR/DNI -02/04/2023 --I called Daughter [name] Jolliffe----[phone]  Discussion: We discussed goals of care for [name] .   -- Patient with acute infarct in the left insular white matter (MRI Brain 01/28/23) -Known meningioma in the right middle cranial fossa with edema in the adjacent temporal lobe (MRI Brain 01/28/23) -Stroke related dysphagia--status post PEG tube placement on 02/02/23 -Poor tolerance of tube feeding with high residuals -Persistent hypernatremia due to free water deficit in the setting of lack of oral intake -- Discussed with patient's daughter that overall prognosis is poor -- [name] verbalizes understanding of overall poor prognosis -She requested will continue current level of care -Patient remains DNR/DNI -Patient daughter will consider de-escalation to hospice if patient does Not improve over the  next several days -If patient improves over the next several days he may be able to go to rehab   Code status:   Code Status: Limited: Do not attempt resuscitation (DNR) -DNR-LIMITED -Do Not Intubate/DNI     DVT prophylaxis: sq heparin  Code Status: DNR/DNI Family Communication: daughter is primary contact Disposition: anticipate SNF rehab    Consultants:  Neurology  Palliative care General Surgery for PEG tube placement on 02/02/2023  Procedures:  PEG tube 01/30/2023   Subjective: --Voiding -Sleepy on and off -No fevers , No emesis  -Concerns about poor tolerance of tube feeding -No BMs -I called and updated patient's daughter  Objective: Vitals:   02/04/23 1700 02/04/23 1730 02/04/23 1800 02/04/23 1830  BP: (!) 146/96 (!) 144/98 (!) 144/99 138/85  Pulse: 84 77 65 72  Resp: (!) 21 16 11 16   Temp:      TempSrc:      SpO2: 98% 100% 100% 98%  Weight:  92.8 kg    Height:        Intake/Output Summary (Last 24 hours) at 02/04/2023 1858 Last data filed at 02/04/2023 1748 Gross per 24 hour  Intake 0 ml  Output 1525 ml  Net -1525 ml   Filed Weights   02/03/23 0500 02/04/23 0500 02/04/23 1730  Weight: 95 kg 92 kg 92.8 kg   Physical Exam  Gen:- Sleepy, but arousable , in no acute distress  HEENT:- [place].AT, No sclera icterus Nose- [place] 2L/min Neck-Supple Neck,No JVD,.  Lungs-  CTAB , fair air movement bilaterally  CV- S1, S2 normal, RRR Abd-  +ve B.Sounds, Abd Soft, No tenderness, PEG tube in situ Extremity/Skin:- +ve  edema,   good pedal pulses , tenderness on palpation of feet and ankles, no  obvious erythema no significant warmth or streaking Psych-Sleepy on and off, occasional confusional episodes with disorientation persist  Neuro-improving left-sided hemiparesis , speech and swallowing difficulties persist  Data Reviewed: I have personally reviewed following labs and imaging studies  CBC: Recent Labs  Lab 01/29/23 0451 01/30/23 0429 01/31/23 0413 02/01/23 0507  02/02/23 0423 02/04/23 1100  WBC 24.1* 15.7* 14.2* 11.4* 8.6  --   NEUTROABS 21.8* 13.5* 13.0* 10.4* 7.9*  --   HGB 8.0* 8.0* 8.2* 8.5* 8.5* 8.2*  HCT 30.2* 29.4* 31.6* 32.0* 32.2* 30.9*  MCV 79.3* 77.6* 79.0* 79.0* 78.5*  --   PLT 359 340 315 322 298  --     Basic Metabolic Panel: Recent Labs  Lab 01/29/23 0451 01/30/23 0429 01/31/23 0413 02/01/23 0507 02/02/23 0423 02/03/23 0412 02/03/23 1629 02/04/23 0526 02/04/23 1100  NA 141   < > 152* 154* 156* 153*  --   --  156*  K 4.0   < > 4.1 4.1 4.1 4.1  --   --  4.1  CL 108   < > 118* 120* 121* 118*  --   --  119*  CO2 23   < > 24 26 24 24   --   --  27  GLUCOSE 117*   < > 159* 192* 149* 219*  --   --  291*  BUN 67*   < > 57* 46* 47* 61*  --   --  74*  CREATININE 2.97*   < > 2.19* 1.73* 1.64* 1.83*  --   --  1.84*  CALCIUM 8.3*   < > 8.7* 9.2 9.2 8.8*  --   --  8.7*  MG 2.4  --   --   --   --  2.8* 2.7* 2.9*  --   PHOS  --   --   --   --  3.4 3.7 3.8 3.7  --    < > = values in this interval not displayed.   CBG: Recent Labs  Lab 02/03/23 2330 02/04/23 0300 02/04/23 0751 02/04/23 1141 02/04/23 1620  GLUCAP 198* 191* 227* 263* 235*    Recent Results (from the past 240 hours)  Culture, blood (Routine X 2) w Reflex to ID Panel     Status: None   Collection Time: 01/28/23  9:39 AM   Specimen: BLOOD  Result Value Ref Range Status   Specimen Description BLOOD BLOOD RIGHT ARM  Final   Special Requests   Final    BOTTLES DRAWN AEROBIC AND ANAEROBIC Blood Culture adequate volume   Culture   Final    NO GROWTH 5 DAYS Performed at [facility], [address], [place], [place] [address]    Report Status 02/02/2023 FINAL  Final  Culture, blood (Routine X 2) w Reflex to ID Panel     Status: None   Collection Time: 01/28/23  9:46 AM   Specimen: BLOOD  Result Value Ref Range Status   Specimen Description BLOOD LEFT ANTECUBITAL  Final   Special Requests   Final    BOTTLES DRAWN AEROBIC AND ANAEROBIC Blood Culture adequate  volume   Culture   Final    NO GROWTH 5 DAYS Performed at [facility], [address], [place], [place] [address]    Report Status 02/02/2023 FINAL  Final  MRSA Next Gen by PCR, Nasal     Status: Abnormal   Collection Time: 01/28/23 10:42 AM   Specimen: Nasal Mucosa; Nasal Swab  Result Value Ref Range Status  MRSA by PCR Next Gen DETECTED (A) NOT DETECTED Final    Comment: RESULT CALLED TO, READ BACK BY AND VERIFIED WITH: IRVING,L ON 01/28/23 AT 2125 BY PURDIE,J        The GeneXpert MRSA Assay (FDA approved for NASAL specimens only), is one component of a comprehensive MRSA colonization surveillance program. It is not intended to diagnose MRSA infection nor to guide or monitor treatment for MRSA infections. Performed at [facility], [address] [place], [place] [address]      Radiology Studies: DG ABDOMEN PEG TUBE LOCATION Result Date: 02/03/2023 CLINICAL DATA:  Peg tube malfunction EXAM: ABDOMEN - 1 VIEW COMPARISON:  11/03/2022, CT 01/21/2023 FINDINGS: Gastrostomy tube balloon projects over the body of the stomach. Small amount of contrast at the gastric fundus and distal stomach. No gross extravasation IMPRESSION: Gastrostomy tube balloon projects over the body of the stomach. Electronically Signed   By: Jasmine Pang M.D.   On: 02/03/2023 19:50   Scheduled Meds:  allopurinol  100 mg Oral Daily   aspirin  300 mg Rectal Daily   Or   aspirin  81 mg Oral Daily   atorvastatin  40 mg Oral QHS   bisacodyl  10 mg Rectal Once   Chlorhexidine Gluconate Cloth  6 each Topical Daily   dexamethasone (DECADRON) injection  4 mg Intravenous Q12H   feeding supplement (PROSource TF20)  60 mL Per Tube BID   ferrous sulfate  325 mg Oral Q breakfast   folic acid  1 mg Oral Daily   free water  400 mL Per Tube Q3H   gabapentin  100 mg Oral QHS   heparin  5,000 Units Subcutaneous Q8H   hydrALAZINE  10 mg Oral BID   insulin aspart  0-9 Units Subcutaneous Q4H   isosorbide dinitrate  5 mg  Oral BID   linaclotide  145 mcg Oral QAC breakfast   metoCLOPramide (REGLAN) injection  5 mg Intravenous Q6H   metoprolol succinate  12.5 mg Oral Daily   multivitamin with minerals  1 tablet Per Tube Daily   neomycin-bacitracin-polymyxin  1 Application Topical Daily   Continuous Infusions:  feeding supplement (JEVITY 1.5 CAL/FIBER) 1,000 mL (02/04/23 1358)    LOS: 7 days    Shon Hale, MD   02/04/2023, 6:58 PM

## 2023-02-05 DIAGNOSIS — I639 Cerebral infarction, unspecified: Secondary | ICD-10-CM

## 2023-02-05 LAB — BASIC METABOLIC PANEL
Anion gap: 9 (ref 5–15)
BUN: 76 mg/dL — ABNORMAL HIGH (ref 8–23)
CO2: 26 mmol/L (ref 22–32)
Calcium: 8.8 mg/dL — ABNORMAL LOW (ref 8.9–10.3)
Chloride: 120 mmol/L — ABNORMAL HIGH (ref 98–111)
Creatinine, Ser: 1.83 mg/dL — ABNORMAL HIGH (ref 0.61–1.24)
GFR, Estimated: 40 mL/min — ABNORMAL LOW (ref >60)
Glucose, Bld: 245 mg/dL — ABNORMAL HIGH (ref 70–99)
Potassium: 4.4 mmol/L (ref 3.5–5.1)
Sodium: 155 mmol/L — ABNORMAL HIGH (ref 135–145)

## 2023-02-05 LAB — GLUCOSE, CAPILLARY
Glucose-Capillary: 193 mg/dL — ABNORMAL HIGH (ref 70–99)
Glucose-Capillary: 215 mg/dL — ABNORMAL HIGH (ref 70–99)
Glucose-Capillary: 216 mg/dL — ABNORMAL HIGH (ref 70–99)
Glucose-Capillary: 219 mg/dL — ABNORMAL HIGH (ref 70–99)
Glucose-Capillary: 228 mg/dL — ABNORMAL HIGH (ref 70–99)
Glucose-Capillary: 241 mg/dL — ABNORMAL HIGH (ref 70–99)

## 2023-02-05 MED ORDER — FERROUS SULFATE 300 (60 FE) MG/5ML PO SOLN
300.0000 mg | Freq: Every day | ORAL | Status: DC
  Administered 2023-02-05 – 2023-02-14 (×10): 300 mg
  Filled 2023-02-05 (×11): qty 5

## 2023-02-05 MED ORDER — AMOXICILLIN-POT CLAVULANATE 400-57 MG/5ML PO SUSR
600.0000 mg | Freq: Two times a day (BID) | ORAL | Status: AC
  Administered 2023-02-06 – 2023-02-08 (×3): 600 mg
  Filled 2023-02-05 (×7): qty 7.5

## 2023-02-05 MED ORDER — SODIUM CHLORIDE 0.45 % IV SOLN: INTRAVENOUS | Status: AC

## 2023-02-05 NOTE — Progress Notes (Signed)
Chaplain Daleen Bo was directed to visit with Mr. [name] after conversation with Chaplain Lead. Mr. [name] presented in a state of mild confusion and unrest but was able to respond to Chaplain's voice and became more calm as encounter continued. Mr. [name] is not able to form full sentences at this time but able to acknowledge through some sounds and non verbal cues. Chaplain provided comforting non-anxious presence, offered prayer and scripture Mr. [name] gave consent and express desire for a future visit from Spiritual Care.

## 2023-02-05 NOTE — Progress Notes (Addendum)
PROGRESS NOTE   [name]  ION:[id number] DOB: 03-07-1955 DOA: 01/28/2023 PCP: Russell Spar, MD   Chief Complaint  Patient presents with   Altered Mental Status   Level of care: Med-Surg  Brief Admission History:  68 year old male with chronic combined systolic and diastolic heart failure, hyperlipidemia, type 2 diabetes mellitus, diabetic polyneuropathy, hypertension, polysubstance abuse, stage III CKD history of cocaine abuse, failure to thrive, coronary artery disease, ischemic cardiomyopathy, history of LV mural thrombus, OSA, history of Fournier gangrene, GERD, who was recently hospitalized for acute heart failure and discharged to [name].  He was sent to the ED by EMS for lethargic and confusion since about 7 PM that evening.  He reportedly has normally been alert and oriented.  He was not answering questions.  Stroke was suspected and he was seen by teleneurologist.  TNK was not given as he was outside the window.  Neurology recommended MRI brain and routine stroke workup.  They recommended aspirin 325 mg daily.   Assessment and Plan:  1)Acute ischemic CVA with Dysphagia - MRI brain positive for small acute infarct in the left insular white matter -Neurology consult appreciated -A1c 7.2, LDL 29 -TTE with bubble study without evidence of LV thrombus, EF is 25 to 30% with wall motion abnormalities, Agitated saline contrast bubble study was negative, with no evidence of any interatrial shunt , no mitral stenosis, no aortic stenosis - c/n  ASA 81 mg daily and atorvastatin for secondary prevention  2)Meningioma in the Right Middle Cranial Fossa with Edema in the adjacent Temporal Lobe-- - Given his persistent encephalopathy, neurosurgeon [name] on 01/30/23 - Recommend trial of dexamethasone to see if patient improves.   -He recommended 4 mg BID.  If patient improves on this, he said to discharge patient on oral decadron and follow up with him in the office  to discuss surgical removal of the meningioma. 02/05/23 Mentation -improving slowly   -- 3)Acute Encephalopathy -due to #1 #2 and # 4 above -Neurology Consult appreciated - EEG no epileptic activity seen; c/w encephalopathy -Neurology, palliative care and neurosurgery input appreciated --please see above 02/05/23 -Serum ammonia is not elevated -ABG without elevated CO2 --Mentation -improving slowly hypernatremia is probably contributory -- 4)HyperNatremia/Hyperchloremia--- pt has free water deficits due to Stroke Related Dysphagia -Continue  Free water flush to 400 ml q 3 hrs -Follow BMP  5)Dysphagia with Swallowing Difficulties/Nutrition--- due to #1 #2 above -Speech therapy appreciated -Status post PEG tube placement on 02/02/2023 02/05/23 -High Tube feeding residual-----started Reglan  -Abdominal x-rays on 02/03/2023 demonstrates PEG tube in expected position -Dietitian consult for tube feeding appreciated  6)AKI----acute kidney injury on CKD stage -3B  - Creatinine remains elevated above baseline -Should improve with increased water flushes and tube feeding---hydration - renally adjust medications, avoid nephrotoxic agents / dehydration  / hypotension   7)Acute respiratory failure with hypoxia due to Aspiration pneumonia - treated with  IV ampicillin sulbactam , -Switch to Augmentin via PEG tube -Continue bronchodilators and mucolytics   8)DM2 A1c 7.2 reflecting uncontrolled DM with hyperglycemia PTA Use Novolog/Humalog Sliding scale insulin with Accu-Cheks/Fingersticks as ordered   9)History of polysubstance abuse including cocaine in the past - he is currently residing in a SNF - UDS negative    10)Normocytic Anemia in the setting of underlying CKD 3B - -Hgb currently greater than 8 -No bleeding concerns monitor closely and transfuse as clinically indicated    11)Hypernatremia/hyperchloremia-- - -poor oral intake in the setting of acute stroke with  dysphagia -Continue IV fluids until able to adequately give free water through PEG tube -PEG tube placed on 02/02/2023  12)PAD/CAD--status post prior angioplasty and stent placement -Continue aspirin, atorvastatin and metoprolol  13)HFrEF--chronic systolic dysfunction CHF---??  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 The entire inferior wall, apical lateral segment,  apical septal segment, and apical anterior segment are akinetic. The  anterior wall, antero-lateral wall, anterior septum, mid inferoseptal segment, and  basal inferoseptal segment are hypokinetic.  -c/n Metoprolol  avoid ACEI/ARB/ARNI and  aldactone due to renal concerns Ok to use Hydralazine/Isosordil combo in lieu of ACEI/ARB -Home torsemide on hold due to AKI and dehydration concerns  14)Social/ethics--- palliative care consult appreciated patient is a DNR/DNI -02/05/2023 --I called [name]----[phone]  Discussion: We discussed goals of care for [name] .   -- Patient with acute infarct in the left insular white matter (MRI Brain 01/28/23) -Known meningioma in the right middle cranial fossa with edema in the adjacent temporal lobe (MRI Brain 01/28/23) -Stroke related dysphagia--status post PEG tube placement on 02/02/23 -Poor tolerance of tube feeding with high residuals -Persistent hypernatremia due to free water deficit in the setting of lack of oral intake -- Discussed with patient's Russell that overall prognosis is poor -- Patient's Russell verbalizes understanding of overall poor prognosis -She requested will continue current level of care -Patient remains DNR/DNI -Patient Russell will consider de-escalation to hospice if  patient does Not improve over the next several days -If patient improves over the next several days he may be able to go to rehab  15)Pressure injury to sacrum stage 2, present on admission  -Nursing care as outlined   Code status:   Code Status: Limited: Do not attempt resuscitation (DNR) -DNR-LIMITED -Do Not Intubate/DNI     DVT prophylaxis: sq heparin  Code Status: DNR/DNI Family Communication: Russell is primary contact Disposition: anticipate SNF rehab    Consultants:  Neurology  Palliative care General Surgery for PEG tube placement on 02/02/2023  Procedures:  PEG tube 01/30/2023   Subjective: --Voiding -asking for ice cream--able to eat small amount  Had BM Family Orest Dikes is at bedside -I called and updated patient's Russell  Objective: Vitals:   02/05/23 1100 02/05/23 1104 02/05/23 1200 02/05/23 1526  BP: 119/80  107/88   Pulse:      Resp: 18  (!) 28   Temp:  97.8 F (36.6 C)  (!) 97.4 F (36.3 C)  TempSrc:  Axillary  Axillary  SpO2:      Weight:      Height:        Intake/Output Summary (Last 24 hours) at 02/05/2023 1719 Last data filed at 02/05/2023 1640 Gross per 24 hour  Intake 1887.38 ml  Output 1550 ml  Net 337.38 ml   Filed Weights   02/04/23 0500 02/04/23 1730 02/05/23 0500  Weight: 92 kg 92.8 kg 95.7 kg   Physical Exam  Gen:- Awake, in no acute distress  HEENT:- [place].AT, No sclera icterus Nose- [place] 2L/min Neck-Supple Neck,No JVD,.  Lungs-  CTAB , fair air movement bilaterally  CV- S1, S2 normal, RRR Abd-  +ve B.Sounds, Abd Soft, No tenderness, PEG tube in situ Extremity/Skin:- +ve  edema,   good pedal pulses , tenderness  on palpation of feet and ankles, no obvious erythema no significant warmth or streaking Psych-Sleepy on and off, occasional confusional episodes with disorientation persist  Neuro-improving left-sided hemiparesis , speech and swallowing difficulties persist  Data Reviewed: I have personally reviewed following labs  and imaging studies  CBC: Recent Labs  Lab 01/30/23 0429 01/31/23 0413 02/01/23 0507 02/02/23 0423 02/04/23 1100  WBC 15.7* 14.2* 11.4* 8.6  --   NEUTROABS 13.5* 13.0* 10.4* 7.9*  --   HGB 8.0* 8.2* 8.5* 8.5* 8.2*  HCT 29.4* 31.6* 32.0* 32.2* 30.9*  MCV 77.6* 79.0* 79.0* 78.5*  --   PLT 340 315 322 298  --     Basic Metabolic Panel: Recent Labs  Lab 02/01/23 0507 02/02/23 0423 02/03/23 0412 02/03/23 1629 02/04/23 0526 02/04/23 1100 02/05/23 0548  NA 154* 156* 153*  --   --  156* 155*  K 4.1 4.1 4.1  --   --  4.1 4.4  CL 120* 121* 118*  --   --  119* 120*  CO2 26 24 24   --   --  27 26  GLUCOSE 192* 149* 219*  --   --  291* 245*  BUN 46* 47* 61*  --   --  74* 76*  CREATININE 1.73* 1.64* 1.83*  --   --  1.84* 1.83*  CALCIUM 9.2 9.2 8.8*  --   --  8.7* 8.8*  MG  --   --  2.8* 2.7* 2.9*  --   --   PHOS  --  3.4 3.7 3.8 3.7  --   --    CBG: Recent Labs  Lab 02/05/23 0009 02/05/23 0421 02/05/23 0729 02/05/23 1112 02/05/23 1520  GLUCAP 241* 193* 215* 228* 219*    Recent Results (from the past 240 hours)  Culture, blood (Routine X 2) w Reflex to ID Panel     Status: None   Collection Time: 01/28/23  9:39 AM   Specimen: BLOOD  Result Value Ref Range Status   Specimen Description BLOOD BLOOD RIGHT ARM  Final   Special Requests   Final    BOTTLES DRAWN AEROBIC AND ANAEROBIC Blood Culture adequate volume   Culture   Final    NO GROWTH 5 DAYS Performed at [name], [address], [place], [place] [address]    Report Status 02/02/2023 FINAL  Final  Culture, blood (Routine X 2) w Reflex to ID Panel     Status: None   Collection Time: 01/28/23  9:46 AM   Specimen: BLOOD  Result Value Ref Range Status   Specimen Description BLOOD LEFT ANTECUBITAL  Final   Special Requests   Final    BOTTLES DRAWN AEROBIC AND ANAEROBIC Blood Culture adequate volume   Culture   Final    NO GROWTH 5 DAYS Performed at [name], [address], [place], [place] [address]     Report Status 02/02/2023 FINAL  Final  MRSA Next Gen by PCR, Nasal     Status: Abnormal   Collection Time: 01/28/23 10:42 AM   Specimen: Nasal Mucosa; Nasal Swab  Result Value Ref Range Status   MRSA by PCR Next Gen DETECTED (A) NOT DETECTED Final    Comment: RESULT CALLED TO, READ BACK BY AND VERIFIED WITH: IRVING,L ON 01/28/23 AT 2125 BY PURDIE,J        The GeneXpert MRSA Assay (FDA approved for NASAL specimens only), is one component of a comprehensive MRSA colonization surveillance program. It is not intended to diagnose MRSA  infection nor to guide or monitor treatment for MRSA infections. Performed at [name], [address] [place], [place] [address]      Radiology Studies: No results found.  Scheduled Meds:  aspirin  300 mg Rectal Daily   Or   aspirin  81 mg Per Tube Daily   atorvastatin  40 mg Per Tube QHS   Chlorhexidine Gluconate Cloth  6 each Topical Daily   dexamethasone (DECADRON) injection  4 mg Intravenous Q12H   feeding supplement (PROSource TF20)  60 mL Per Tube BID   ferrous sulfate  300 mg Per Tube Daily   folic acid  1 mg Per Tube Daily   free water  400 mL Per Tube Q3H   gabapentin  100 mg Per Tube QHS   heparin  5,000 Units Subcutaneous Q8H   hydrALAZINE  25 mg Per Tube BID   insulin aspart  0-9 Units Subcutaneous Q4H   isosorbide dinitrate  5 mg Per Tube BID   linaclotide  290 mcg Per Tube QAC breakfast   metoCLOPramide (REGLAN) injection  10 mg Intravenous Q6H   metoprolol tartrate  25 mg Per Tube BID   multivitamin with minerals  1 tablet Per Tube Daily   neomycin-bacitracin-polymyxin  1 Application Topical Daily   Continuous Infusions:  sodium chloride 83 mL/hr at 02/05/23 1640   feeding supplement (JEVITY 1.5 CAL/FIBER) 20 mL/hr at 02/05/23 1300    LOS: 8 days    Shon Hale, MD  02/05/2023, 5:19 PM

## 2023-02-05 NOTE — Plan of Care (Signed)
  Problem: Education: Goal: Knowledge of General Education information will improve Description: Including pain rating scale, medication(s)/side effects and non-pharmacologic comfort measures Outcome: Progressing   Problem: Activity: Goal: Risk for activity intolerance will decrease Outcome: Progressing   Problem: Pain Managment: Goal: General experience of comfort will improve and/or be controlled Outcome: Progressing

## 2023-02-06 DIAGNOSIS — Z7189 Other specified counseling: Secondary | ICD-10-CM | POA: Diagnosis not present

## 2023-02-06 DIAGNOSIS — G9341 Metabolic encephalopathy: Secondary | ICD-10-CM | POA: Diagnosis not present

## 2023-02-06 DIAGNOSIS — Z515 Encounter for palliative care: Secondary | ICD-10-CM | POA: Diagnosis not present

## 2023-02-06 DIAGNOSIS — I69391 Dysphagia following cerebral infarction: Secondary | ICD-10-CM | POA: Diagnosis not present

## 2023-02-06 DIAGNOSIS — I110 Hypertensive heart disease with heart failure: Secondary | ICD-10-CM | POA: Diagnosis not present

## 2023-02-06 DIAGNOSIS — I639 Cerebral infarction, unspecified: Secondary | ICD-10-CM | POA: Diagnosis not present

## 2023-02-06 LAB — GLUCOSE, CAPILLARY
Glucose-Capillary: 168 mg/dL — ABNORMAL HIGH (ref 70–99)
Glucose-Capillary: 196 mg/dL — ABNORMAL HIGH (ref 70–99)
Glucose-Capillary: 201 mg/dL — ABNORMAL HIGH (ref 70–99)
Glucose-Capillary: 242 mg/dL — ABNORMAL HIGH (ref 70–99)
Glucose-Capillary: 256 mg/dL — ABNORMAL HIGH (ref 70–99)
Glucose-Capillary: 270 mg/dL — ABNORMAL HIGH (ref 70–99)
Glucose-Capillary: 285 mg/dL — ABNORMAL HIGH (ref 70–99)

## 2023-02-06 LAB — BASIC METABOLIC PANEL
Anion gap: 8 (ref 5–15)
BUN: 76 mg/dL — ABNORMAL HIGH (ref 8–23)
CO2: 24 mmol/L (ref 22–32)
Calcium: 8.5 mg/dL — ABNORMAL LOW (ref 8.9–10.3)
Chloride: 113 mmol/L — ABNORMAL HIGH (ref 98–111)
Creatinine, Ser: 1.62 mg/dL — ABNORMAL HIGH (ref 0.61–1.24)
GFR, Estimated: 46 mL/min — ABNORMAL LOW (ref >60)
Glucose, Bld: 306 mg/dL — ABNORMAL HIGH (ref 70–99)
Potassium: 4.3 mmol/L (ref 3.5–5.1)
Sodium: 145 mmol/L (ref 135–145)

## 2023-02-06 MED ORDER — AMOXICILLIN-POT CLAVULANATE 600-42.9 MG/5ML PO SUSR
600.0000 mg | Freq: Once | ORAL | Status: DC
  Filled 2023-02-06: qty 5

## 2023-02-06 MED ORDER — ISOSORBIDE DINITRATE 5 MG PO TABS
5.0000 mg | ORAL_TABLET | Freq: Two times a day (BID) | ORAL | Status: DC
Start: 2023-02-09 — End: 2023-02-15
  Administered 2023-02-09 – 2023-02-14 (×11): 5 mg
  Filled 2023-02-06 (×13): qty 1

## 2023-02-06 MED ORDER — INSULIN ASPART 100 UNIT/ML IJ SOLN
0.0000 [IU] | Freq: Three times a day (TID) | INTRAMUSCULAR | Status: DC
  Administered 2023-02-06: 7 [IU] via SUBCUTANEOUS
  Administered 2023-02-07 (×2): 11 [IU] via SUBCUTANEOUS
  Administered 2023-02-07 – 2023-02-08 (×4): 4 [IU] via SUBCUTANEOUS
  Administered 2023-02-09 (×2): 7 [IU] via SUBCUTANEOUS
  Administered 2023-02-09: 11 [IU] via SUBCUTANEOUS
  Administered 2023-02-10: 7 [IU] via SUBCUTANEOUS
  Administered 2023-02-10: 4 [IU] via SUBCUTANEOUS
  Administered 2023-02-10 – 2023-02-11 (×2): 7 [IU] via SUBCUTANEOUS
  Administered 2023-02-11 – 2023-02-12 (×3): 15 [IU] via SUBCUTANEOUS
  Administered 2023-02-12 – 2023-02-13 (×3): 20 [IU] via SUBCUTANEOUS
  Administered 2023-02-13: 15 [IU] via SUBCUTANEOUS
  Administered 2023-02-14: 20 [IU] via SUBCUTANEOUS

## 2023-02-06 MED ORDER — AMOXICILLIN-POT CLAVULANATE 600-42.9 MG/5ML PO SUSR
600.0000 mg | Freq: Once | ORAL | Status: AC
  Administered 2023-02-06: 600 mg
  Filled 2023-02-06: qty 5

## 2023-02-06 MED ORDER — METOPROLOL TARTRATE 25 MG PO TABS
25.0000 mg | ORAL_TABLET | Freq: Two times a day (BID) | ORAL | Status: DC
  Administered 2023-02-09 – 2023-02-14 (×11): 25 mg
  Filled 2023-02-06 (×10): qty 1

## 2023-02-06 MED ORDER — INSULIN ASPART 100 UNIT/ML IJ SOLN
0.0000 [IU] | Freq: Every day | INTRAMUSCULAR | Status: DC
  Administered 2023-02-10: 3 [IU] via SUBCUTANEOUS
  Administered 2023-02-12: 5 [IU] via SUBCUTANEOUS

## 2023-02-06 MED ORDER — INSULIN ASPART 100 UNIT/ML IJ SOLN
0.0000 [IU] | Freq: Three times a day (TID) | INTRAMUSCULAR | Status: DC
  Administered 2023-02-06: 11 [IU] via SUBCUTANEOUS

## 2023-02-06 MED ORDER — SODIUM CHLORIDE 0.45 % IV SOLN: INTRAVENOUS | Status: DC

## 2023-02-06 MED ORDER — FREE WATER
300.0000 mL | Status: DC
  Administered 2023-02-06 – 2023-02-07 (×5): 300 mL

## 2023-02-06 MED ORDER — HYDRALAZINE HCL 25 MG PO TABS
25.0000 mg | ORAL_TABLET | Freq: Two times a day (BID) | ORAL | Status: DC
Start: 2023-02-09 — End: 2023-02-15
  Administered 2023-02-09 – 2023-02-14 (×11): 25 mg
  Filled 2023-02-06 (×11): qty 1

## 2023-02-06 MED ORDER — DEXAMETHASONE SODIUM PHOSPHATE 10 MG/ML IJ SOLN
2.0000 mg | Freq: Two times a day (BID) | INTRAMUSCULAR | Status: DC
Start: 2023-02-06 — End: 2023-02-13
  Administered 2023-02-06 – 2023-02-13 (×14): 2 mg via INTRAVENOUS
  Filled 2023-02-06 (×14): qty 1

## 2023-02-06 NOTE — Progress Notes (Signed)
Speech Language Pathology Treatment: Dysphagia  Patient Details Name: [name] MRN: [id number] DOB: Sep 28, 1955 Today's Date: 02/06/2023 Time: 1308-6578 SLP Time Calculation (min) (ACUTE ONLY): 16 min  Assessment / Plan / Recommendation Clinical Impression  Pt had PEG placed on Thursday due to inability to sustain alertness for safe PO intake and Pt refusals for PO when he was alert. MD reports that nursing tried to feed him some over the weekend and it resulted in Pt coughing. Although Pt has PEG, family is hopeful that Pt will be able to take some food by mouth. MD would like SLP to continue seeing Pt. Pt more alert and and initially talkative today and allowed oral care and ice chips. Pt required cues for alertness after a few minutes. Small sip of water from cup resulted in labial spillage- improved performance with spoon and with ice chips. Pt unable to sustain alertness for additional trials. Recommend oral care and offering ice chips and small sips of water when Pt is alert. SLP will follow.    HPI HPI: [name] is a 68 y.o. male who is  [facility] resident with past medical history of strokes, meningioma, hypertension, diabetes, LV thrombus was on Xarelto more than 2 years ago, CKD, CHF, coronary artery disease who was brought in after altered mental status.     Patient is confused and unable to provide any history.  Per chart review, patient was recently admitted from 01/14/2023 to 01/17/2023 for acute on chronic congestive heart failure as well as AKI on CKD.  He was then seen again in the emergency room on 01/21/2023 for lower abdominal pain, nausea and vomiting.  CT Abdo and pelvis was done which was concerning for enteritis, no bowel obstruction.  He was given Zofran followed by Compazine which finally helped with the symptoms and he was discharged.  He was then brought back on 01/22/2023 for altered mental status, nausea, vomiting and low oxygen saturations.  He was admitted  on the medicine team.  MRI brain was obtained and showed acute ischemic stroke.  Therefore neurology was consulted for further recommendations . BSE/SLE requested. Pt failed the [place].      SLP Plan  Continue with current plan of care      Recommendations for follow up therapy are one component of a multi-disciplinary discharge planning process, led by the attending physician.  Recommendations may be updated based on patient status, additional functional criteria and insurance authorization.    Recommendations  Diet recommendations:  (ice chips and small sips of water after oral care) Medication Administration: Via alternative means Supervision: Staff to assist with self feeding;Full supervision/cueing for compensatory strategies Compensations: Small sips/bites Postural Changes and/or Swallow Maneuvers: Seated upright 90 degrees;Upright 30-60 min after meal                  Oral care prior to ice chip/H20;Staff/trained caregiver to provide oral care   Frequent or constant Supervision/Assistance Dysphagia, unspecified (R13.10)     Continue with current plan of care   Thank you,  Havery Moros, CCC-SLP [phone]   Merlean Pizzini  02/06/2023, 9:27 PM

## 2023-02-06 NOTE — Progress Notes (Signed)
SLP Cancellation Note  Patient Details Name: [name] MRN: [id number] DOB: Feb 26, 1955   Cancelled treatment:       Reason Eval/Treat Not Completed: Other (comment) (Pt being bathed. SLP will check back as schedule permits. Dr. Mariea Clonts did clarify that he would like SLP to continue to follow as he was given some PO and was noted to cough. Recommend oral care and ice chips as well.)  Thank you,  Havery Moros, CCC-SLP [phone]  Teyonna Plaisted 02/06/2023, 3:23 PM

## 2023-02-06 NOTE — Inpatient Diabetes Management (Signed)
Inpatient Diabetes Program Recommendations  AACE/ADA: New Consensus Statement on Inpatient Glycemic Control (2015)  Target Ranges:  Prepandial:   less than 140 mg/dL      Peak postprandial:   less than 180 mg/dL (1-2 hours)      Critically ill patients:  140 - 180 mg/dL   Lab Results  Component Value Date   GLUCAP 270 (H) 02/06/2023   HGBA1C 7.2 (H) 01/28/2023    Latest Reference Range & Units 02/05/23 19:37 02/06/23 00:10 02/06/23 04:06 02/06/23 07:47 02/06/23 11:54  Glucose-Capillary 70 - 99 mg/dL 401 (H) 027 (H) 253 (H) 285 (H) 270 (H)  (H): Data is abnormally high Review of Glycemic Control  Diabetes history: DM2 Outpatient Diabetes medications: Lantus 5 units at HS, Jardiance 10 mg daily Current orders for Inpatient glycemic control: Novolog 0-20 units correction scale TID, Novolog 0-10 units custom HS scale  Inpatient Diabetes Program Recommendations:   Noted that patient has been greater than 200 mg/dl. On continuous Jevity tube feedings.  Recommend changing Novolog correction scale to 0-20 units every 4 hours if on continuous tube feedings and on steroids. Discontinue Novolog 0-10 units custom scale at HS.   Smith Mince RN BSN CDE Diabetes Coordinator Pager: [phone]  8am-5pm

## 2023-02-06 NOTE — Progress Notes (Signed)
Patient does not have Bipap in room at this time.  Patient has been too altered in past to use machine from previous notes.  Patient is still in mittens at this time, and when I asked about whether he uses a Bipap at home when sleeping, patient responded, sometimes.  Patient stated he did not wish to use one while here at hospital.

## 2023-02-06 NOTE — Plan of Care (Signed)
  Problem: Coping: Goal: Level of anxiety will decrease Outcome: Progressing   Problem: Safety: Goal: Ability to remain free from injury will improve Outcome: Progressing   Problem: Skin Integrity: Goal: Risk for impaired skin integrity will decrease Outcome: Progressing   Problem: Health Behavior/Discharge Planning: Goal: Ability to manage health-related needs will improve Outcome: Progressing   Problem: Skin Integrity: Goal: Risk for impaired skin integrity will decrease Outcome: Progressing

## 2023-02-06 NOTE — Progress Notes (Signed)
Palliative:   [name] is lying quietly in bed.  He appears acutely/chronically ill and somewhat frail.  He will make but not keep eye contact.  He is able to tell me his name, but not where we are.  I do not believe that he can make his basic needs known.  There is no family at bedside at this time.   Call to daughter/HCPOA, [name].  We talk about [name]'s acute and chronic health concerns.  We talk about his mental status.  We also talk about tube feeding and use of Reglan.  At this point, the goal continues to be short-term rehab with time for recovery.  Face-to-face discussion with attending, bedside nursing staff, transition of care team related to patient condition, needs goals of care, disposition.  Plan: At this point continue to treat the treatable but no CPR or intubation.  Time for outcomes.  Anticipate short-term rehab at [facility].  50 minutes  Lillia Carmel, NP Palliative Medicine Team  Team Phone [phone]

## 2023-02-06 NOTE — Plan of Care (Signed)
  Problem: Coping: Goal: Level of anxiety will decrease Outcome: Progressing   Problem: Health Behavior/Discharge Planning: Goal: Ability to manage health-related needs will improve Outcome: Progressing   Problem: Skin Integrity: Goal: Risk for impaired skin integrity will decrease Outcome: Progressing

## 2023-02-06 NOTE — Progress Notes (Signed)
PROGRESS NOTE   [name]  EAV:[id number] DOB: 1955/07/31 DOA: 01/28/2023 PCP: Russell Spar, MD   Chief Complaint  Patient presents with   Altered Mental Status   Level of care: Med-Surg  Brief Admission History:  68 year old male with chronic combined systolic and diastolic heart failure, hyperlipidemia, type 2 diabetes mellitus, diabetic polyneuropathy, hypertension, polysubstance abuse, stage III CKD history of cocaine abuse, failure to thrive, coronary artery disease, ischemic cardiomyopathy, history of LV mural thrombus, OSA, history of Fournier gangrene, GERD, who was recently hospitalized for acute heart failure and discharged to [facility].  He was sent to the ED by EMS for lethargic and confusion since about 7 PM that evening.  He reportedly has normally been alert and oriented.  He was not answering questions.  Stroke was suspected and he was seen by teleneurologist.  TNK was not given as he was outside the window.  Neurology recommended MRI brain and routine stroke workup.  They recommended aspirin 325 mg daily.   Assessment and Plan:  1)Acute ischemic CVA with Dysphagia - MRI brain positive for small acute infarct in the left insular white matter -Neurology consult appreciated -A1c 7.2, LDL 29 -TTE with bubble study without evidence of LV thrombus, EF is 25 to 30% with wall motion abnormalities, Agitated saline contrast bubble study was negative, with no evidence of any interatrial shunt , no mitral stenosis, no aortic stenosis - c/n  ASA 81 mg daily and atorvastatin for secondary prevention  2)Meningioma in the Right Middle Cranial Fossa with Edema in the adjacent Temporal Lobe-- - Given his persistent encephalopathy, neurosurgeon Dr. Danielle [name] on 01/30/23 - Recommend trial of dexamethasone to see if patient improves.   -He recommended 4 mg BID.  If patient improves on this, he said to discharge patient on oral decadron and follow up with him in the office  to discuss surgical removal of the meningioma. 02/06/23 Mentation -improving  -I reached out to Dr. Barnett [name] (Neurosurgeon) --[phone]-- --he recommends tapering down Decadron from 4 mg twice daily to 2 mg twice daily for 1 week then 1 mg twice daily for 1 week then 1 mg daily for 1 week then stop  -- 3)Acute Encephalopathy -due to #1 #2 and # 4 above -Neurology Consult appreciated - EEG no epileptic activity seen; c/w encephalopathy -Neurology, palliative care and neurosurgery input appreciated --please see above 02/06/23 -Serum ammonia is not elevated -ABG without elevated CO2 --Mentation -improving slowly -- 4)HyperNatremia/Hyperchloremia--- pt has free water deficits due to Stroke Related Dysphagia -Sodium and chloride improving with free water flushes via PEG and IV fluids -Follow BMP  5)Dysphagia with Swallowing Difficulties/Nutrition--- due to #1 #2 above -Speech therapy appreciated -Status post PEG tube placement on 02/02/2023 02/06/23 -High Tube feeding residual-----started Reglan  -Abdominal x-rays on 02/03/2023 demonstrates PEG tube in expected position -Dietitian consult for tube feeding appreciated -Continue to titrate PEG tube feeding per dietitian recommendation to reach goal -Speech pathologist consult requested--to see if patient can have trial of oral intake  6)AKI----acute kidney injury on CKD stage -3B  - Creatinine improving with increased water flushes and tube feeding---hydration - renally adjust medications, avoid nephrotoxic agents / dehydration  / hypotension   7)Acute respiratory failure with hypoxia due to Aspiration pneumonia - treated with  IV ampicillin sulbactam , okay to complete Augmentin via PEG -Currently requiring 2 L of oxygen via nasal cannula -Continue bronchodilators and mucolytics   8)DM2 A1c 7.2 reflecting uncontrolled DM with hyperglycemia PTA Use Novolog/Humalog Sliding scale  insulin with Accu-Cheks/Fingersticks as ordered    9)History of polysubstance abuse including cocaine in the past - he is currently residing in a SNF - UDS negative    10)Normocytic Anemia in the setting of underlying CKD 3B - -Hgb currently greater than 8 -No bleeding concerns monitor closely and transfuse as clinically indicated    11)Social/ethics--- palliative care consult appreciated patient is a DNR/DNI -02/06/2023 --I called Daughter [name]----[phone]  Discussion: We discussed goals of care for [name] .   -- Patient with acute infarct in the left insular white matter (MRI Brain 01/28/23) -Known meningioma in the right middle cranial fossa with edema in the adjacent temporal lobe (MRI Brain 01/28/23) -Stroke related dysphagia--status post PEG tube placement on 02/02/23 -Poor tolerance of tube feeding with high residuals -Persistent hypernatremia due to free water deficit in the setting of lack of oral intake -- Discussed with patient's daughter that overall prognosis is poor -- [name] verbalizes understanding of overall poor prognosis -She requested will continue current level of care -Patient remains DNR/DNI -Patient daughter will consider de-escalation to hospice if patient does Not improve over the next several days -If patient improves over the next several days he may be able to go to rehab  12)PAD/CAD--status post prior angioplasty and stent placement -Continue aspirin, atorvastatin and metoprolol  13)HFrEF--chronic systolic dysfunction CHF---??  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 The entire inferior wall, apical lateral segment,  apical septal segment, and apical anterior segment are akinetic. The  anterior wall,  antero-lateral wall, anterior septum, mid inferoseptal segment, and  basal inferoseptal segment are hypokinetic.  -c/n Metoprolol  avoid ACEI/ARB/ARNI and  aldactone due to renal concerns Ok to use Hydralazine/Isosordil combo in lieu of ACEI/ARB -Home torsemide on hold due to AKI and dehydration concerns  14)Pressure injury to sacrum stage 2, present on admission  -Nursing care as outlined   Code status:   Code Status: Limited: Do not attempt resuscitation (DNR) -DNR-LIMITED -Do Not Intubate/DNI     DVT prophylaxis: sq heparin  Code Status: DNR/DNI Family Communication: daughter is primary contact Disposition: anticipate SNF rehab    Consultants:  Neurology  Palliative care General Surgery for PEG tube placement on 02/02/2023  Procedures:  PEG tube 01/30/2023   Subjective: --Voiding -Tolerating tube feeding better -more Awake and more interactive  Objective: Vitals:   02/06/23 0500 02/06/23 0600 02/06/23 0757 02/06/23 0817  BP: 117/69 101/61  (!) 86/56  Pulse: 65 62  62  Resp: (!) 22 (!) 21    Temp: (!) 97.5 F (36.4 C)  (!) 96.4 F (35.8 C)   TempSrc: Axillary  Axillary   SpO2: 99% 100%    Weight: 94.4 kg     Height:        Intake/Output Summary (Last 24 hours) at 02/06/2023 1152 Last data filed at 02/06/2023 0837 Gross per 24 hour  Intake 3684.05 ml  Output 1651 ml  Net 2033.05 ml   Filed Weights   02/04/23 1730 02/05/23 0500 02/06/23 0500  Weight: 92.8 kg 95.7 kg 94.4 kg   Physical Exam  Gen:- Awake, in no acute distress  HEENT:- [place].AT, No sclera icterus Nose-  2L/min Neck-Supple Neck,No JVD,.  Lungs-no wheezing,, fair air movement bilaterally  CV-  S1, S2 normal, RRR Abd-  +ve B.Sounds, Abd Soft, No tenderness, PEG tube in situ Extremity/Skin:- +ve  edema,   good pedal pulses , tenderness on palpation of feet and ankles, no obvious erythema no significant warmth or streaking Psych-more awake, occasional confusional episodes with disorientation  persist  Neuro-improving left-sided hemiparesis , speech and swallowing difficulties persist  Data Reviewed: I have personally reviewed following labs and imaging studies  CBC: Recent Labs  Lab 01/31/23 0413 02/01/23 0507 02/02/23 0423 02/04/23 1100  WBC 14.2* 11.4* 8.6  --   NEUTROABS 13.0* 10.4* 7.9*  --   HGB 8.2* 8.5* 8.5* 8.2*  HCT 31.6* 32.0* 32.2* 30.9*  MCV 79.0* 79.0* 78.5*  --   PLT 315 322 298  --     Basic Metabolic Panel: Recent Labs  Lab 02/02/23 0423 02/03/23 0412 02/03/23 1629 02/04/23 0526 02/04/23 1100 02/05/23 0548 02/06/23 0444  NA 156* 153*  --   --  156* 155* 145  K 4.1 4.1  --   --  4.1 4.4 4.3  CL 121* 118*  --   --  119* 120* 113*  CO2 24 24  --   --  27 26 24   GLUCOSE 149* 219*  --   --  291* 245* 306*  BUN 47* 61*  --   --  74* 76* 76*  CREATININE 1.64* 1.83*  --   --  1.84* 1.83* 1.62*  CALCIUM 9.2 8.8*  --   --  8.7* 8.8* 8.5*  MG  --  2.8* 2.7* 2.9*  --   --   --   PHOS 3.4 3.7 3.8 3.7  --   --   --    CBG: Recent Labs  Lab 02/05/23 1520 02/05/23 1937 02/06/23 0010 02/06/23 0406 02/06/23 0747  GLUCAP 219* 216* 242* 256* 285*    Recent Results (from the past 240 hours)  Culture, blood (Routine X 2) w Reflex to ID Panel     Status: None   Collection Time: 01/28/23  9:39 AM   Specimen: BLOOD  Result Value Ref Range Status   Specimen Description BLOOD BLOOD RIGHT ARM  Final   Special Requests   Final    BOTTLES DRAWN AEROBIC AND ANAEROBIC Blood Culture adequate volume   Culture   Final    NO GROWTH 5 DAYS Performed at [facility], [address] [place], [place] [address]    Report Status 02/02/2023 FINAL  Final  Culture, blood (Routine X 2) w Reflex to ID Panel     Status: None   Collection Time: 01/28/23  9:46 AM   Specimen: BLOOD  Result Value Ref Range Status   Specimen Description BLOOD LEFT ANTECUBITAL  Final   Special Requests   Final    BOTTLES DRAWN AEROBIC AND ANAEROBIC Blood Culture adequate volume   Culture    Final    NO GROWTH 5 DAYS Performed at [facility], [address] [place], [place] [address]    Report Status 02/02/2023 FINAL  Final  MRSA Next Gen by PCR, Nasal     Status: Abnormal   Collection Time: 01/28/23 10:42 AM   Specimen: Nasal Mucosa; Nasal Swab  Result Value Ref Range Status   MRSA by PCR Next Gen DETECTED (A) NOT DETECTED Final    Comment: RESULT CALLED TO, READ BACK BY AND VERIFIED WITH: IRVING,L ON 01/28/23 AT 2125 BY PURDIE,J        The GeneXpert MRSA Assay (FDA approved for NASAL specimens only),  is one component of a comprehensive MRSA colonization surveillance program. It is not intended to diagnose MRSA infection nor to guide or monitor treatment for MRSA infections. Performed at [facility], [address], [place], [place] [address]      Radiology Studies: No results found.  Scheduled Meds:  amoxicillin-clavulanate  600 mg of amoxicillin Per Tube Q12H   aspirin  300 mg Rectal Daily   Or   aspirin  81 mg Per Tube Daily   atorvastatin  40 mg Per Tube QHS   Chlorhexidine Gluconate Cloth  6 each Topical Daily   dexamethasone (DECADRON) injection  4 mg Intravenous Q12H   feeding supplement (PROSource TF20)  60 mL Per Tube BID   ferrous sulfate  300 mg Per Tube Daily   folic acid  1 mg Per Tube Daily   free water  400 mL Per Tube Q3H   gabapentin  100 mg Per Tube QHS   heparin  5,000 Units Subcutaneous Q8H   hydrALAZINE  25 mg Per Tube BID   insulin aspart  0-10 Units Subcutaneous QHS   insulin aspart  0-20 Units Subcutaneous TID WC   isosorbide dinitrate  5 mg Per Tube BID   linaclotide  290 mcg Per Tube QAC breakfast   metoprolol tartrate  25 mg Per Tube BID   multivitamin with minerals  1 tablet Per Tube Daily   neomycin-bacitracin-polymyxin  1 Application Topical Daily   Continuous Infusions:  feeding supplement (JEVITY 1.5 CAL/FIBER) 1,000 mL (02/06/23 0421)    LOS: 9 days    Shon Hale, MD  02/06/2023, 11:52 AM

## 2023-02-06 NOTE — Anesthesia Postprocedure Evaluation (Signed)
Anesthesia Post Note  Patient: PAULINO CORK  Procedure(s) Performed: PERCUTANEOUS ENDOSCOPIC GASTROSTOMY (PEG) PLACEMENT (Abdomen) ESOPHAGOGASTRODUODENOSCOPY (EGD) WITH PROPOFOL (Mouth)  Patient location during evaluation: Phase II Anesthesia Type: General Level of consciousness: awake Pain management: pain level controlled Vital Signs Assessment: post-procedure vital signs reviewed and stable Respiratory status: spontaneous breathing and respiratory function stable Cardiovascular status: blood pressure returned to baseline and stable Postop Assessment: no headache and no apparent nausea or vomiting Anesthetic complications: no Comments: Late entry   No notable events documented.   Last Vitals:  Vitals:   02/06/23 0817 02/06/23 1206  BP: (!) 86/56   Pulse: 62   Resp:    Temp:  37 C  SpO2:      Last Pain:  Vitals:   02/06/23 1206  TempSrc: Axillary  PainSc:                  Windell Norfolk

## 2023-02-07 ENCOUNTER — Telehealth: Payer: Self-pay | Admitting: Cardiovascular Disease

## 2023-02-07 ENCOUNTER — Inpatient Hospital Stay (HOSPITAL_COMMUNITY): Payer: No Typology Code available for payment source

## 2023-02-07 DIAGNOSIS — G9341 Metabolic encephalopathy: Secondary | ICD-10-CM | POA: Diagnosis not present

## 2023-02-07 DIAGNOSIS — E440 Moderate protein-calorie malnutrition: Secondary | ICD-10-CM | POA: Insufficient documentation

## 2023-02-07 DIAGNOSIS — Z515 Encounter for palliative care: Secondary | ICD-10-CM | POA: Diagnosis not present

## 2023-02-07 DIAGNOSIS — I10 Essential (primary) hypertension: Secondary | ICD-10-CM | POA: Diagnosis not present

## 2023-02-07 DIAGNOSIS — I69391 Dysphagia following cerebral infarction: Secondary | ICD-10-CM | POA: Diagnosis not present

## 2023-02-07 DIAGNOSIS — Z7189 Other specified counseling: Secondary | ICD-10-CM | POA: Diagnosis not present

## 2023-02-07 DIAGNOSIS — R1310 Dysphagia, unspecified: Secondary | ICD-10-CM | POA: Diagnosis not present

## 2023-02-07 DIAGNOSIS — I6389 Other cerebral infarction: Secondary | ICD-10-CM | POA: Diagnosis not present

## 2023-02-07 DIAGNOSIS — I639 Cerebral infarction, unspecified: Secondary | ICD-10-CM | POA: Diagnosis not present

## 2023-02-07 LAB — BASIC METABOLIC PANEL
Anion gap: 9 (ref 5–15)
BUN: 73 mg/dL — ABNORMAL HIGH (ref 8–23)
CO2: 24 mmol/L (ref 22–32)
Calcium: 8.7 mg/dL — ABNORMAL LOW (ref 8.9–10.3)
Chloride: 112 mmol/L — ABNORMAL HIGH (ref 98–111)
Creatinine, Ser: 1.49 mg/dL — ABNORMAL HIGH (ref 0.61–1.24)
GFR, Estimated: 51 mL/min — ABNORMAL LOW (ref >60)
Glucose, Bld: 296 mg/dL — ABNORMAL HIGH (ref 70–99)
Potassium: 4.4 mmol/L (ref 3.5–5.1)
Sodium: 145 mmol/L (ref 135–145)

## 2023-02-07 LAB — GLUCOSE, CAPILLARY
Glucose-Capillary: 290 mg/dL — ABNORMAL HIGH (ref 70–99)
Glucose-Capillary: 290 mg/dL — ABNORMAL HIGH (ref 70–99)
Glucose-Capillary: 176 mg/dL — ABNORMAL HIGH (ref 70–99)
Glucose-Capillary: 122 mg/dL — ABNORMAL HIGH (ref 70–99)

## 2023-02-07 MED ORDER — BISACODYL 10 MG RE SUPP
10.0000 mg | Freq: Once | RECTAL | Status: AC
  Administered 2023-02-07: 10 mg via RECTAL
  Filled 2023-02-07: qty 1

## 2023-02-07 MED ORDER — ASPIRIN 325 MG PO TABS
325.0000 mg | ORAL_TABLET | Freq: Every day | ORAL | Status: DC
  Administered 2023-02-08 – 2023-02-12 (×5): 325 mg
  Filled 2023-02-07 (×6): qty 1

## 2023-02-07 MED ORDER — PROCHLORPERAZINE EDISYLATE 10 MG/2ML IJ SOLN
10.0000 mg | Freq: Once | INTRAMUSCULAR | Status: AC
  Administered 2023-02-07: 10 mg via INTRAVENOUS
  Filled 2023-02-07: qty 2

## 2023-02-07 MED ORDER — FREE WATER
200.0000 mL | Status: DC
  Administered 2023-02-07 – 2023-02-14 (×41): 200 mL

## 2023-02-07 MED ORDER — PROCHLORPERAZINE 25 MG RE SUPP
25.0000 mg | Freq: Once | RECTAL | Status: DC
  Filled 2023-02-07: qty 1

## 2023-02-07 MED ORDER — SODIUM CHLORIDE 0.9 % IV SOLN: INTRAVENOUS | Status: AC

## 2023-02-07 MED ORDER — METOCLOPRAMIDE HCL 5 MG/ML IJ SOLN
10.0000 mg | Freq: Four times a day (QID) | INTRAMUSCULAR | Status: DC
  Administered 2023-02-07 – 2023-02-14 (×30): 10 mg via INTRAVENOUS
  Filled 2023-02-07 (×31): qty 2

## 2023-02-07 NOTE — Progress Notes (Signed)
Patient refusing Bipap.  No distress noted at this time.

## 2023-02-07 NOTE — Progress Notes (Addendum)
PROGRESS NOTE   [name]  YQM:[id number] DOB: 1955-09-18 DOA: 01/28/2023 PCP: Benetta Spar, MD   Chief Complaint  Patient presents with   Altered Mental Status   Level of care: Med-Surg  Brief Admission History:  68 year old male with chronic combined systolic and diastolic heart failure, hyperlipidemia, type 2 diabetes mellitus, diabetic polyneuropathy, hypertension, polysubstance abuse, stage III CKD history of cocaine abuse, failure to thrive, coronary artery disease, ischemic cardiomyopathy, history of LV mural thrombus, OSA, history of Fournier gangrene, GERD, who was recently hospitalized for acute heart failure and discharged to [facility].  He was sent to the ED by EMS for lethargic and confusion since about 7 PM that evening.  He reportedly has normally been alert and oriented.  He was not answering questions.  Stroke was suspected and he was seen by teleneurologist.  TNK was not given as he was outside the window.  Neurology recommended MRI brain and routine stroke workup.  They recommended aspirin 325 mg daily.   Assessment and Plan: 1)Acute ischemic CVA with Dysphagia - MRI brain positive for small acute infarct in the left insular white matter -Neurology consult appreciated -A1c 7.2, LDL 29 -TTE with bubble study without evidence of LV thrombus, EF is 25 to 30% with wall motion abnormalities, Agitated saline contrast bubble study was negative, with no evidence of any interatrial shunt , no mitral stenosis, no aortic stenosis - c/n  ASA 325mg  daily and atorvastatin for secondary prevention  2)Meningioma in the Right Middle Cranial Fossa with Edema in the adjacent Temporal Lobe-- - Given his persistent encephalopathy, neurosurgeon Dr. Danielle Dess on 01/30/23 - Recommend trial of dexamethasone to see if patient improves.   -He recommended 4 mg BID.  If patient improves on this, he said to discharge patient on oral decadron and follow up with him in the office to  discuss surgical removal of the meningioma. 02/07/23 Mentation -improving  -I reached out to Dr. Barnett Abu (Neurosurgeon) --[phone]-- --he recommends tapering down Decadron from 4 mg twice daily to 2 mg twice daily for 1 week then 1 mg twice daily for 1 week then 1 mg daily for 1 week then stop  -- 3)Acute Encephalopathy -due to #1 #2 and # 4 above -Neurology Consult appreciated - EEG no epileptic activity seen; c/w encephalopathy -Neurology, palliative care and neurosurgery input appreciated --please see above 02/07/23 -Serum ammonia is not elevated -ABG without elevated CO2 --Mentation -improving slowly -- 4)HyperNatremia/Hyperchloremia--- pt has free water deficits due to Stroke Related Dysphagia -Sodium and chloride improving with free water flushes via PEG and IV fluids -Follow BMP  5)Dysphagia with Swallowing Difficulties/Nutrition--- due to #1 #2 above -Speech therapy appreciated -Status post PEG tube placement on 02/02/2023 02/07/23 -Patient did well on IV Reglan--was on the routine tube feeding without emesis Dose of IV Reglan was around noon on 02/06/2023--without IV regular patient on 02/07/2023 started to have episode of emesis again -iv Reglan restarted on 02/07/2023 for GI prokinetic effects--tube feeding currently on hold pending repeat abdominal x-rays on 02/07/2023 -Abdominal x-rays on 02/03/2023 demonstrates PEG tube in expected position -Dietitian consult for tube feeding appreciated --Speech pathologist consult requested--to see if patient can have trial of oral intake  6)AKI----acute kidney injury on CKD stage -3B  - Creatinine improving with increased water flushes and tube feeding---hydration - renally adjust medications, avoid nephrotoxic agents / dehydration  / hypotension   7)Acute respiratory failure with hypoxia due to Aspiration pneumonia - treated with  IV ampicillin sulbactam , okay  to complete Augmentin via PEG -Currently requiring 2 L of oxygen via  nasal cannula -Continue bronchodilators and mucolytics   8)DM2 A1c 7.2 reflecting uncontrolled DM with hyperglycemia PTA Use Novolog/Humalog Sliding scale insulin with Accu-Cheks/Fingersticks as ordered   9)History of polysubstance abuse including cocaine in the past - he is currently residing in a SNF - UDS negative    10)Normocytic Anemia in the setting of underlying CKD 3B - -Hgb currently greater than 8 -No bleeding concerns monitor closely and transfuse as clinically indicated    11)Social/ethics--- palliative care consult appreciated patient is a DNR/DNI -02/07/2023 --I called Daughter [name]----[phone]  Discussion: We discussed goals of care for [name] .   -- Patient with acute infarct in the left insular white matter (MRI Brain 01/28/23) -Known meningioma in the right middle cranial fossa with edema in the adjacent temporal lobe (MRI Brain 01/28/23) -Stroke related dysphagia--status post PEG tube placement on 02/02/23 -Poor tolerance of tube feeding with high residuals -Persistent hypernatremia due to free water deficit in the setting of lack of oral intake -- Discussed with patient's daughter that overall prognosis is poor -- [name] verbalizes understanding of overall poor prognosis -She requested will continue current level of care -Patient remains DNR/DNI -Patient daughter will consider de-escalation to hospice if patient does Not improve over the next several days -If patient improves over the next several days he may be able to go to rehab  12)PAD/CAD--status post prior angioplasty and stent placement -Continue aspirin, atorvastatin and metoprolol  13)HFrEF--chronic systolic dysfunction CHF---??  If some component of ischemic cardiomyopathy TTE with bubble study without evidence of LV thrombus, EF is 25 to 30% with wall motion abnormalities, Agitated saline contrast bubble study was negative, with no evidence of any interatrial shunt , no  mitral stenosis, no aortic stenosis The apex is aneurysmal. The entire inferior wall, apical lateral segment,  apical septal segment, and apical anterior segment are akinetic. The  anterior wall, antero-lateral wall, anterior septum, mid inferoseptal segment, and  basal inferoseptal segment are hypokinetic.  -c/n Metoprolol  avoid ACEI/ARB/ARNI and  aldactone due to renal concerns Ok to use Hydralazine/Isosordil combo in lieu of ACEI/ARB -Home torsemide on hold due to AKI and dehydration concerns  14)Pressure injury to sacrum stage 2, present on admission  -Nursing care as outlined   Code status:   Code Status: Limited: Do not attempt resuscitation (DNR) -DNR-LIMITED -Do Not Intubate/DNI     DVT prophylaxis: sq heparin  Code Status: DNR/DNI Family Communication: daughter is primary contact Disposition: anticipate SNF rehab (-Hold discharge to SNF until tolerating tube feeding well)   Consultants:  Neurology  Palliative care General Surgery for PEG tube placement on 02/02/2023  Procedures:  PEG tube 01/30/2023   Subjective: --Voiding Emesis persist--repeat abdominal x-rays requested -I called and updated patient's daughter -more Awake and more interactive -Hold discharge to SNF until tolerating tube feeding well  Objective: Vitals:   02/07/23 1100 02/07/23 1200 02/07/23 1248 02/07/23 1658  BP: 105/74 (!) 138/90 114/86 127/86  Pulse: 63 65 68 64  Resp: 16 (!) 21 19 20   Temp:   98 F (36.7 C)   TempSrc:   Rectal   SpO2: 96% 98% 100% 100%  Weight:      Height:        Intake/Output Summary (Last 24 hours) at 02/07/2023 1829 Last data filed at 02/07/2023 1656 Gross per 24 hour  Intake 1344.08 ml  Output 2000 ml  Net -655.92 ml  Filed Weights   02/05/23 0500 02/06/23 0500 02/07/23 0704  Weight: 95.7 kg 94.4 kg 95.1 kg   Physical Exam  Gen:- Awake, in no acute distress  HEENT:- [place].AT, No sclera icterus Nose- [place] 2L/min Neck-Supple Neck,No JVD,.  Lungs-no  wheezing,, fair air movement bilaterally  CV- S1, S2 normal, RRR Abd-  +ve B.Sounds, Abd Soft, No tenderness, PEG tube in situ Extremity/Skin:- +ve  edema,   good pedal pulses , tenderness on palpation of feet and ankles, no obvious erythema no significant warmth or streaking Psych-more awake, occasional confusional episodes with disorientation persist  Neuro-improving left-sided hemiparesis , speech and swallowing difficulties persist  Data Reviewed: I have personally reviewed following labs and imaging studies  CBC: Recent Labs  Lab 02/01/23 0507 02/02/23 0423 02/04/23 1100  WBC 11.4* 8.6  --   NEUTROABS 10.4* 7.9*  --   HGB 8.5* 8.5* 8.2*  HCT 32.0* 32.2* 30.9*  MCV 79.0* 78.5*  --   PLT 322 298  --     Basic Metabolic Panel: Recent Labs  Lab 02/02/23 0423 02/03/23 0412 02/03/23 1629 02/04/23 0526 02/04/23 1100 02/05/23 0548 02/06/23 0444 02/07/23 0407  NA 156* 153*  --   --  156* 155* 145 145  K 4.1 4.1  --   --  4.1 4.4 4.3 4.4  CL 121* 118*  --   --  119* 120* 113* 112*  CO2 24 24  --   --  27 26 24 24   GLUCOSE 149* 219*  --   --  291* 245* 306* 296*  BUN 47* 61*  --   --  74* 76* 76* 73*  CREATININE 1.64* 1.83*  --   --  1.84* 1.83* 1.62* 1.49*  CALCIUM 9.2 8.8*  --   --  8.7* 8.8* 8.5* 8.7*  MG  --  2.8* 2.7* 2.9*  --   --   --   --   PHOS 3.4 3.7 3.8 3.7  --   --   --   --    CBG: Recent Labs  Lab 02/06/23 1958 02/06/23 2106 02/07/23 0727 02/07/23 1130 02/07/23 1625  GLUCAP 196* 168* 290* 290* 176*    No results found for this or any previous visit (from the past 240 hours).    Radiology Studies: No results found.  Scheduled Meds:  amoxicillin-clavulanate  600 mg of amoxicillin Per Tube Q12H   aspirin  300 mg Rectal Daily   Or   aspirin  81 mg Per Tube Daily   atorvastatin  40 mg Per Tube QHS   Chlorhexidine Gluconate Cloth  6 each Topical Daily   dexamethasone (DECADRON) injection  2 mg Intravenous Q12H   feeding supplement (PROSource  TF20)  60 mL Per Tube BID   ferrous sulfate  300 mg Per Tube Daily   folic acid  1 mg Per Tube Daily   free water  200 mL Per Tube Q4H   gabapentin  100 mg Per Tube QHS   heparin  5,000 Units Subcutaneous Q8H   [START ON 02/09/2023] hydrALAZINE  25 mg Per Tube BID   insulin aspart  0-10 Units Subcutaneous QHS   insulin aspart  0-20 Units Subcutaneous TID WC   [START ON 02/09/2023] isosorbide dinitrate  5 mg Per Tube BID   linaclotide  290 mcg Per Tube QAC breakfast   metoCLOPramide (REGLAN) injection  10 mg Intravenous Q6H   [START ON 02/09/2023] metoprolol tartrate  25 mg Per Tube BID   multivitamin with  minerals  1 tablet Per Tube Daily   neomycin-bacitracin-polymyxin  1 Application Topical Daily   Continuous Infusions:  sodium chloride 41 mL/hr at 02/07/23 1459   feeding supplement (JEVITY 1.5 CAL/FIBER) 20 mL/hr at 02/07/23 1206    LOS: 10 days    Shon Hale, MD  02/07/2023, 6:29 PM

## 2023-02-07 NOTE — Telephone Encounter (Signed)
Spoke with daughter who states that she would like cardiology to see her dad while he is in the hospital d/t him having an appt tomorrow. Informed daughter that we can reschedule his appt and that the covering doctor would have to consult cardiology in the hospital. Pt would like to call back to schedule an appt.

## 2023-02-07 NOTE — Telephone Encounter (Signed)
[name] is calling requesting his cardiologist come and check on patient at [facility] after having stroke. Requesting call back.

## 2023-02-07 NOTE — Progress Notes (Addendum)
Nutrition Follow-up  DOCUMENTATION CODES:   Non-severe (moderate) malnutrition in context of acute illness/injury  INTERVENTION:   Recommend resume Reglan.  Resume Jevity 1.5 TF this afternoon at 10 ml/h. Continue to slowly increase Jevity 1.5 by 10 ml every 8 hours to goal rate of 60 ml/h.  Continue Prosource TF20 60 ml BID.  TF regimen provides 2320 kcal, 132 gm protein, 1094 ml free water daily.  Current free water flushes 300 ml q4h for a total of 2894 ml free water daily.  Continue MVI with minerals daily via tube.   If vomiting recurs, may need to convert G-tube into G-J tube.   NUTRITION DIAGNOSIS:   Moderate Malnutrition related to acute illness (acute CVA) as evidenced by mild muscle depletion, mild fat depletion.  Ongoing   GOAL:   Patient will meet greater than or equal to 90% of their needs  Progressing   MONITOR:   TF tolerance, Diet advancement  REASON FOR ASSESSMENT:   Rounds    ASSESSMENT:   68 yo male admitted with acute ischemic CVA. PMH includes HLD, HTN, CAD, CKD, ischemic cardiomyopathy, cocaine abuse, DM-2, CHF. Recent admission for acute HF, discharged to SNF on 01/17/23.  PEG placed 1/23; started Jevity 1.5 at 20 ml/h, with goal rate of 60 ml/h. TF held 1/24 for high residual ~1400 ml per RN. Reglan added. Abd film showed PEG in appropriate position.  Last Reglan dose was 1/27 at 11:48. Jevity 1.5 was resumed 1/25 at 10 ml/h, slowly advanced to 20 ml/h and patient tolerated until 1/28 AM when he vomited.   Labs reviewed.  CBG: [phone]  Medications reviewed and include decadron, ferrous sulfate, folic acid, novolog, MVI with minerals.  Admission weight: 95.3 kg Current weight: 95.1 kg  Patient meets criteria for moderate malnutrition, given mild-moderate depletion of muscle and subcutaneous fat mass.  NUTRITION - FOCUSED PHYSICAL EXAM:  Flowsheet Row Most Recent Value  Orbital Region No depletion  Upper Arm Region Moderate  depletion  Thoracic and Lumbar Region Mild depletion  Buccal Region No depletion  Temple Region No depletion  Clavicle Bone Region No depletion  Clavicle and Acromion Bone Region No depletion  Scapular Bone Region Moderate depletion  Dorsal Hand Unable to assess  Patellar Region Mild depletion  Anterior Thigh Region Mild depletion  Posterior Calf Region Mild depletion  Edema (RD Assessment) Mild  Hair Reviewed  Eyes Reviewed  Mouth Reviewed  Skin Reviewed  Nails Reviewed       Diet Order:   Diet Order     None       EDUCATION NEEDS:   No education needs have been identified at this time  Skin:  Skin Assessment: Skin Integrity Issues: Skin Integrity Issues:: Stage II Stage II: sacrum  Last BM:  1/27 type 6  Height:   Ht Readings from Last 1 Encounters:  01/28/23 5\' 9"  (1.753 m)    Weight:   Wt Readings from Last 1 Encounters:  02/07/23 95.1 kg    Ideal Body Weight:  72.7 kg  BMI:  Body mass index is 30.96 kg/m.  Estimated Nutritional Needs:   Kcal:  2200-2400  Protein:  120-140 gm  Fluid:  2.2-2.4 L   Gabriel Rainwater RD, LDN, CNSC Contact Inpatient RD using Secure Chat. If unavailable, use group chat "RD Inpatient" via Secure Chat in [employer].

## 2023-02-07 NOTE — Progress Notes (Signed)
Palliative: Mr. [name] is lying quietly in bed.  He continues to appear acutely/chronically ill and somewhat frail.  He will briefly open his eyes making but not keeping eye contact.  He is able to tell me his name, but not where we are.  There is no family at bedside at this time.  Face-to-face discussion with bedside nursing staff related to patient condition, needs.  [name] has been having some intolerance of tube feeding although Reglan has been started.  Registered dietitian consulted.  At this point the goals remain for [name] to have time for improvement, discharging to short-term rehab.  Conference with attending, bedside nursing staff, transition of care team related to patient condition, needs, goals of care, disposition.  Plan: Continue to treat the treatable but no CPR or intubation.  Time for outcomes.  Short-term rehab at [facility] when able.  PMT to follow.          35 minutes  Lillia Carmel, NP Palliative medicine team Team phone [phone]

## 2023-02-07 NOTE — Inpatient Diabetes Management (Signed)
Inpatient Diabetes Program Recommendations  AACE/ADA: New Consensus Statement on Inpatient Glycemic Control (2015)  Target Ranges:  Prepandial:   less than 140 mg/dL      Peak postprandial:   less than 180 mg/dL (1-2 hours)      Critically ill patients:  140 - 180 mg/dL   Lab Results  Component Value Date   GLUCAP 290 (H) 02/07/2023   HGBA1C 7.2 (H) 01/28/2023    Review of Glycemic Control  Latest Reference Range & Units 02/06/23 07:47 02/06/23 11:54 02/06/23 15:50 02/06/23 19:58 02/06/23 21:06 02/07/23 07:27 02/07/23 11:30  Glucose-Capillary 70 - 99 mg/dL 161 (H) 096 (H) 045 (H) 196 (H) 168 (H) 290 (H) 290 (H)  (H): Data is abnormally high Diabetes history: DM2 Outpatient Diabetes medications: Lantus 5 units at HS, Jardiance 10 mg daily Current orders for Inpatient glycemic control: Novolog 0-20 units correction scale TID, Novolog 0-10 units custom HS scale   Inpatient Diabetes Program Recommendations:   Noted that patient has been greater than 200 mg/dl. On continuous Jevity tube feedings.   Consider: - Changing Novolog correction scale to 0-20 units every 4 hours if on continuous tube feedings and on steroids.  - Discontinue Novolog 0-10 units custom scale at HS. - Adding Novolog 3 units Q4H for tube feed coverage (to be stopped or held in the event tube feeds stopped/ steroids stopped).   Thanks, Lujean Rave, MSN, RNC-OB Diabetes Coordinator [phone] (8a-5p)

## 2023-02-07 NOTE — Progress Notes (Signed)
Pt vomited 10:24.  Emesis was partially digested tube feed.  MD notified.  New verbal order to hold tube feed until 15:00 today.   Restart feed at 10 ml/hour.  Increase by 10 ml every 8 hours until reach 30 ml/hour which was prior feed.  Keep head of bed at 45 degrees.

## 2023-02-07 NOTE — Plan of Care (Signed)
  Problem: Education: Goal: Knowledge of General Education information will improve Description: Including pain rating scale, medication(s)/side effects and non-pharmacologic comfort measures Outcome: Progressing   Problem: Health Behavior/Discharge Planning: Goal: Ability to manage health-related needs will improve Outcome: Progressing   Problem: Clinical Measurements: Goal: Ability to maintain clinical measurements within normal limits will improve Outcome: Progressing Goal: Will remain free from infection Outcome: Progressing Goal: Diagnostic test results will improve Outcome: Progressing Goal: Respiratory complications will improve Outcome: Progressing Goal: Cardiovascular complication will be avoided Outcome: Progressing   Problem: Activity: Goal: Risk for activity intolerance will decrease Outcome: Progressing   Problem: Nutrition: Goal: Adequate nutrition will be maintained Outcome: Progressing   Problem: Coping: Goal: Level of anxiety will decrease Outcome: Progressing   Problem: Elimination: Goal: Will not experience complications related to bowel motility Outcome: Progressing Goal: Will not experience complications related to urinary retention Outcome: Progressing   Problem: Pain Managment: Goal: General experience of comfort will improve and/or be controlled Outcome: Progressing   Problem: Safety: Goal: Ability to remain free from injury will improve Outcome: Progressing   Problem: Skin Integrity: Goal: Risk for impaired skin integrity will decrease Outcome: Progressing   Problem: Education: Goal: Ability to describe self-care measures that may prevent or decrease complications (Diabetes Survival Skills Education) will improve Outcome: Progressing Goal: Individualized Educational Video(s) Outcome: Progressing   Problem: Coping: Goal: Ability to adjust to condition or change in health will improve Outcome: Progressing   Problem: Fluid  Volume: Goal: Ability to maintain a balanced intake and output will improve Outcome: Progressing   Problem: Health Behavior/Discharge Planning: Goal: Ability to identify and utilize available resources and services will improve Outcome: Progressing Goal: Ability to manage health-related needs will improve Outcome: Progressing   Problem: Metabolic: Goal: Ability to maintain appropriate glucose levels will improve Outcome: Progressing   Problem: Nutritional: Goal: Maintenance of adequate nutrition will improve Outcome: Progressing Goal: Progress toward achieving an optimal weight will improve Outcome: Progressing   Problem: Skin Integrity: Goal: Risk for impaired skin integrity will decrease Outcome: Progressing   Problem: Tissue Perfusion: Goal: Adequacy of tissue perfusion will improve Outcome: Progressing   Problem: Education: Goal: Knowledge of disease or condition will improve Outcome: Progressing Goal: Knowledge of secondary prevention will improve (MUST DOCUMENT ALL) Outcome: Progressing Goal: Knowledge of patient specific risk factors will improve (DELETE if not current risk factor) Outcome: Progressing   Problem: Ischemic Stroke/TIA Tissue Perfusion: Goal: Complications of ischemic stroke/TIA will be minimized Outcome: Progressing   Problem: Coping: Goal: Will verbalize positive feelings about self Outcome: Progressing Goal: Will identify appropriate support needs Outcome: Progressing   Problem: Health Behavior/Discharge Planning: Goal: Ability to manage health-related needs will improve Outcome: Progressing Goal: Goals will be collaboratively established with patient/family Outcome: Progressing   Problem: Self-Care: Goal: Ability to participate in self-care as condition permits will improve Outcome: Progressing Goal: Verbalization of feelings and concerns over difficulty with self-care will improve Outcome: Progressing Goal: Ability to communicate  needs accurately will improve Outcome: Progressing   Problem: Nutrition: Goal: Risk of aspiration will decrease Outcome: Progressing Goal: Dietary intake will improve Outcome: Progressing   Problem: Education: Goal: Knowledge of secondary prevention will improve (MUST DOCUMENT ALL) Outcome: Progressing   Problem: Education: Goal: Knowledge of secondary prevention will improve (MUST DOCUMENT ALL) Outcome: Progressing

## 2023-02-08 ENCOUNTER — Ambulatory Visit: Payer: 59 | Admitting: Student

## 2023-02-08 DIAGNOSIS — I639 Cerebral infarction, unspecified: Secondary | ICD-10-CM | POA: Diagnosis not present

## 2023-02-08 DIAGNOSIS — Z515 Encounter for palliative care: Secondary | ICD-10-CM | POA: Diagnosis not present

## 2023-02-08 DIAGNOSIS — L89152 Pressure ulcer of sacral region, stage 2: Secondary | ICD-10-CM

## 2023-02-08 DIAGNOSIS — Z7189 Other specified counseling: Secondary | ICD-10-CM | POA: Diagnosis not present

## 2023-02-08 DIAGNOSIS — I69391 Dysphagia following cerebral infarction: Secondary | ICD-10-CM | POA: Diagnosis not present

## 2023-02-08 LAB — CBC
HCT: 35.8 % — ABNORMAL LOW (ref 39.0–52.0)
Hemoglobin: 9.6 g/dL — ABNORMAL LOW (ref 13.0–17.0)
MCH: 20.8 pg — ABNORMAL LOW (ref 26.0–34.0)
MCHC: 26.8 g/dL — ABNORMAL LOW (ref 30.0–36.0)
MCV: 77.5 fL — ABNORMAL LOW (ref 80.0–100.0)
Platelets: 269 10*3/uL (ref 150–400)
RBC: 4.62 MIL/uL (ref 4.22–5.81)
RDW: 23.6 % — ABNORMAL HIGH (ref 11.5–15.5)
WBC: 25.8 10*3/uL — ABNORMAL HIGH (ref 4.0–10.5)
nRBC: 0.2 % (ref 0.0–0.2)

## 2023-02-08 LAB — RENAL FUNCTION PANEL
Albumin: 2.8 g/dL — ABNORMAL LOW (ref 3.5–5.0)
Anion gap: 8 (ref 5–15)
BUN: 57 mg/dL — ABNORMAL HIGH (ref 8–23)
CO2: 29 mmol/L (ref 22–32)
Calcium: 8.7 mg/dL — ABNORMAL LOW (ref 8.9–10.3)
Chloride: 112 mmol/L — ABNORMAL HIGH (ref 98–111)
Creatinine, Ser: 1.25 mg/dL — ABNORMAL HIGH (ref 0.61–1.24)
GFR, Estimated: >60 mL/min (ref >60)
Glucose, Bld: 176 mg/dL — ABNORMAL HIGH (ref 70–99)
Phosphorus: 2.7 mg/dL (ref 2.5–4.6)
Potassium: 3.9 mmol/L (ref 3.5–5.1)
Sodium: 149 mmol/L — ABNORMAL HIGH (ref 135–145)

## 2023-02-08 LAB — GLUCOSE, CAPILLARY
Glucose-Capillary: 166 mg/dL — ABNORMAL HIGH (ref 70–99)
Glucose-Capillary: 173 mg/dL — ABNORMAL HIGH (ref 70–99)
Glucose-Capillary: 185 mg/dL — ABNORMAL HIGH (ref 70–99)
Glucose-Capillary: 163 mg/dL — ABNORMAL HIGH (ref 70–99)

## 2023-02-08 NOTE — Progress Notes (Signed)
PROGRESS NOTE   [name]  LKG:[id number] DOB: [birth year] DOA: 01/28/2023 PCP: Benetta Spar, MD   Chief Complaint  Patient presents with   Altered Mental Status   Level of care: Med-Surg  Brief Admission History:  No notes on file   Assessment and Plan: 1)Acute ischemic CVA with Dysphagia - MRI brain positive for small acute infarct in the left insular white matter -Neurology consult appreciated -A1c 7.2, LDL 29 -TTE with bubble study without evidence of LV thrombus, EF is 25 to 30% with wall motion abnormalities, Agitated saline contrast bubble study was negative, with no evidence of any interatrial shunt , no mitral stenosis, no aortic stenosis - c/n  ASA 325mg  daily and atorvastatin for secondary prevention.  2)Meningioma in the Right Middle Cranial Fossa with Edema in the adjacent Temporal Lobe-- - Given his persistent encephalopathy, neurosurgeon Dr. Danielle Dess on 01/30/23 - Recommend trial of dexamethasone to see if patient improves.   -He recommended 4 mg BID.  If patient improves on this, he said to discharge patient on oral decadron and follow up with him in the office to discuss surgical removal of the meningioma. 02/07/23 Mentation -improving  -I reached out to Dr. Barnett Abu (Neurosurgeon) --[phone]-- --he recommends tapering down Decadron from 4 mg twice daily to 2 mg twice daily for 1 week then 1 mg twice daily for 1 week then 1 mg daily for 1 week then stop.  -- 3)Acute Encephalopathy -due to #1 #2 and # 4 above -Neurology Consult appreciated - EEG no epileptic activity seen; c/w encephalopathy -Neurology, palliative care and neurosurgery input appreciated --please see above 02/07/23 -Serum ammonia is not elevated -ABG without elevated CO2 --Mentation -improving slowly -- 4)HyperNatremia/Hyperchloremia--- pt has free water deficits due to Stroke Related Dysphagia -Sodium and chloride improving with free water flushes via PEG and IV  fluids -Follow BMP  5)Dysphagia with Swallowing Difficulties/Nutrition--- due to #1 #2 above -Speech therapy appreciated -Status post PEG tube placement on 02/02/2023 02/07/23 -Patient did well on IV Reglan--was on the routine tube feeding without emesis Dose of IV Reglan was around noon on 02/06/2023--without IV regular patient on 02/07/2023 started to have episode of emesis again -iv Reglan restarted on 02/07/2023 for GI prokinetic effects--tube feeding has been resumed and following discussions with patient's healthcare power of attorney GI service has been consulted. -Abdominal x-ray not demonstrating any obstruction and tube in adequate place. -Dietitian consult for tube feeding appreciated; continue to follow recommendations. --Speech pathologist consult requested--to see if patient can have trial of oral intake. -If needed a different type of tube feedings can be attempted.  6)AKI----acute kidney injury on CKD stage -3B  - Creatinine improving with increased water flushes and tube feeding---hydration - renally adjust medications, avoid nephrotoxic agents / dehydration  / hypotension   7)Acute respiratory failure with hypoxia due to Aspiration pneumonia - treated with  IV ampicillin sulbactam , okay to complete Augmentin via PEG -Currently requiring 2 L of oxygen via nasal cannula -Continue bronchodilators and mucolytics   8)DM2 A1c 7.2 reflecting uncontrolled DM with hyperglycemia PTA Use Novolog/Humalog Sliding scale insulin with Accu-Cheks/Fingersticks as ordered   9)History of polysubstance abuse including cocaine in the past - he is currently residing in a SNF - UDS negative    10)Normocytic Anemia in the setting of underlying CKD 3B - -Hgb currently greater than 8 -No bleeding concerns monitor closely and transfuse as clinically indicated    11)Social/ethics--- palliative care consult appreciated patient is a DNR/DNI Goals of  care Discussion: We discussed goals of care for  [name] .   -- Patient with acute infarct in the left insular white matter (MRI Brain 01/28/23) -Known meningioma in the right middle cranial fossa with edema in the adjacent temporal lobe (MRI Brain 01/28/23) -Stroke related dysphagia--status post PEG tube placement on 02/02/23 -Poor tolerance of tube feeding with high residuals -Persistent hypernatremia due to free water deficit in the setting of lack of oral intake -- Discussed with patient's daughter that overall prognosis is poor and very low purposeful recovery rate anticipated. -- [name] verbalizes understanding of overall poor prognosis. -She requested will continue current level of care -Patient remains DNR/DNI -Patient daughter will consider de-escalation to hospice if patient does Not improve over the next several days. -If patient improves over the next several days he may be able to go to rehab.  12)PAD/CAD--status post prior angioplasty and stent placement -Continue aspirin, atorvastatin and metoprolol  13)HFrEF--chronic systolic dysfunction CHF---??  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 The entire inferior wall, apical lateral segment,  apical septal segment, and apical anterior segment are akinetic. The  anterior wall, antero-lateral wall, anterior septum, mid inferoseptal segment, and  basal inferoseptal segment are hypokinetic.  -c/n Metoprolol  avoid ACEI/ARB/ARNI and  aldactone due to renal concerns Ok to use Hydralazine/Isosordil combo in lieu of ACEI/ARB -Home torsemide on hold due to AKI and dehydration concerns  14)Pressure injury to sacrum stage 2, present on admission  -Nursing care as outlined -Continue constant repositioning  and preventive measures.   Code status:   Code Status: Limited: Do not attempt resuscitation (DNR) -DNR-LIMITED -Do Not Intubate/DNI     DVT prophylaxis: sq heparin  Code Status: DNR/DNI Family Communication: daughter is primary contact Disposition: anticipate SNF rehab (-Hold discharge to SNF until tolerating tube feeding well)   Consultants:  Neurology  Palliative care General Surgery for PEG tube placement on 02/02/2023  Procedures:  PEG tube 01/30/2023   Subjective: Afebrile, no chest pain, no new focal deficit.  Overnight demonstrating difficulty to tolerate tube feedings per nursing staff reports.  Objective: Vitals:   02/07/23 1658 02/07/23 2123 02/08/23 0644 02/08/23 1226  BP: 127/86 (!) 118/96 131/76 108/87  Pulse: 64 (!) 53  74  Resp: 20 18 20 19   Temp:  97.8 F (36.6 C) 98.1 F (36.7 C) 98 F (36.7 C)  TempSrc:      SpO2: 100% 92%  93%  Weight:   90.6 kg   Height:        Intake/Output Summary (Last 24 hours) at 02/08/2023 1701 Last data filed at 02/08/2023 1230 Gross per 24 hour  Intake 60 ml  Output 1550 ml  Net -1490 ml   Filed Weights   02/06/23 0500 02/07/23 0704 02/08/23 0644  Weight: 94.4 kg 95.1 kg 90.6 kg   Physical Exam General exam: Alert, able to follow simple commands; no appropriately answering questions and demonstrating diffuse generalized weakness. Respiratory system: Good saturation on 2 L supplementation; no wheezing or crackles. Cardiovascular system:RRR.  No rubs, no gallops, no JVD. Gastrointestinal system: Abdomen is nondistended, soft and nontender.  Positive bowel sounds appreciated.  PEG tube in situ. Central nervous system: No new focal neurological deficits.  Patient left-sided hemiparesis, speech and  swallowing difficulties persist on exam Extremities: No cyanosis or clubbing; trace edema appreciated bilaterally. Skin: No petechiae.  Stage II sacrum pressure injury without signs of superimposed infection.  Present on  admission. Psychiatry: Judgement and insight appear normal. Mood & affect appropriate.   Data Reviewed: I have personally reviewed following labs and imaging studies  CBC: Recent Labs  Lab 02/02/23 0423 02/04/23 1100 02/08/23 0425  WBC 8.6  --  25.8*  NEUTROABS 7.9*  --   --   HGB 8.5* 8.2* 9.6*  HCT 32.2* 30.9* 35.8*  MCV 78.5*  --  77.5*  PLT 298  --  269    Basic Metabolic Panel: Recent Labs  Lab 02/02/23 0423 02/03/23 0412 02/03/23 1629 02/04/23 0526 02/04/23 1100 02/05/23 0548 02/06/23 0444 02/07/23 0407 02/08/23 0425  NA 156* 153*  --   --  156* 155* 145 145 149*  K 4.1 4.1  --   --  4.1 4.4 4.3 4.4 3.9  CL 121* 118*  --   --  119* 120* 113* 112* 112*  CO2 24 24  --   --  27 26 24 24 29   GLUCOSE 149* 219*  --   --  291* 245* 306* 296* 176*  BUN 47* 61*  --   --  74* 76* 76* 73* 57*  CREATININE 1.64* 1.83*  --   --  1.84* 1.83* 1.62* 1.49* 1.25*  CALCIUM 9.2 8.8*  --   --  8.7* 8.8* 8.5* 8.7* 8.7*  MG  --  2.8* 2.7* 2.9*  --   --   --   --   --   PHOS 3.4 3.7 3.8 3.7  --   --   --   --  2.7   CBG: Recent Labs  Lab 02/07/23 1625 02/07/23 2122 02/08/23 0734 02/08/23 1116 02/08/23 1637  GLUCAP 176* 122* 166* 173* 185*    No results found for this or any previous visit (from the past 240 hours).    Radiology Studies: DG Abd 1 View Result Date: 02/07/2023 CLINICAL DATA:  Persistent emesis. EXAM: ABDOMEN - 1 VIEW COMPARISON:  02/03/2023 FINDINGS: Technically limited due to difficulty with positioning. Patient is rotated. Gastrostomy tube is faintly visualized. No gaseous bowel distension to suggest obstruction. There is formed stool in the distal colon. Portions of the left abdomen are not included in the field of view. IMPRESSION: No bowel obstruction or abnormal distention. Small volume formed stool in the distal colon. Electronically Signed   By: Narda Rutherford M.D.   On: 02/07/2023 19:23    Scheduled Meds:  amoxicillin-clavulanate  600 mg of  amoxicillin Per Tube Q12H   aspirin  325 mg Per Tube Daily   atorvastatin  40 mg Per Tube QHS   Chlorhexidine Gluconate Cloth  6 each Topical Daily   dexamethasone (DECADRON) injection  2 mg Intravenous Q12H   feeding supplement (PROSource TF20)  60 mL Per Tube BID   ferrous sulfate  300 mg Per Tube Daily   folic acid  1 mg Per Tube Daily   free water  200 mL Per Tube Q4H   gabapentin  100 mg Per Tube QHS   heparin  5,000 Units Subcutaneous Q8H   [START ON 02/09/2023] hydrALAZINE  25 mg Per Tube BID   insulin aspart  0-10 Units Subcutaneous QHS   insulin aspart  0-20 Units Subcutaneous TID WC   [START ON 02/09/2023] isosorbide dinitrate  5 mg Per Tube BID   linaclotide  290 mcg  Per Tube QAC breakfast   metoCLOPramide (REGLAN) injection  10 mg Intravenous Q6H   [START ON 02/09/2023] metoprolol tartrate  25 mg Per Tube BID   multivitamin with minerals  1 tablet Per Tube Daily   neomycin-bacitracin-polymyxin  1 Application Topical Daily   Continuous Infusions:  feeding supplement (JEVITY 1.5 CAL/FIBER) 1,000 mL (02/08/23 0930)    LOS: 11 days    Vassie Loll, MD  02/08/2023, 5:01 PM

## 2023-02-08 NOTE — Plan of Care (Signed)
Problem: Education: Goal: Knowledge of General Education information will improve Description: Including pain rating scale, medication(s)/side effects and non-pharmacologic comfort measures Outcome: Progressing   Problem: Clinical Measurements: Goal: Ability to maintain clinical measurements within normal limits will improve Outcome: Progressing   Problem: Nutrition: Goal: Adequate nutrition will be maintained Outcome: Progressing

## 2023-02-08 NOTE — Progress Notes (Signed)
Pt has tolerated tube feeding this shift thus far with no n/v noted. Tube feeding remains at 10 ml/hr at this time, MD aware. Head of bed elevated. Bed in lowest position. Call bell in reach.

## 2023-02-08 NOTE — Progress Notes (Signed)
Patient not using BIPAP at night. No unit in room at this time.

## 2023-02-08 NOTE — Progress Notes (Signed)
Occupational Therapy Treatment Patient Details Name: [name] MRN: [id number] DOB: [birth year] Today's Date: 02/08/2023   History of present illness [name] is a 68 year old male with chronic combined systolic and diastolic heart failure, hyperlipidemia, type 2 diabetes mellitus, diabetic polyneuropathy, hypertension, polysubstance abuse, stage III CKD history of cocaine abuse, failure to thrive, coronary artery disease, ischemic cardiomyopathy, history of LV mural thrombus, OSA, history of Fournier gangrene, GERD, who was recently hospitalized for acute heart failure and discharged to [facility].     He was sent to the ED by EMS for lethargic and confusion since about 7 PM that evening.  He reportedly has normally been alert and oriented.  He was not answering questions.  Stroke was suspected and he was seen by teleneurologist.  TNK was not given as he was outside the window.  Neurology recommended MRI brain and routine stroke workup.  They recommended aspirin 325 mg daily.   OT comments  Pt working with physical therapist when this OT arrived. Pt required max A for peri-care after a bowel movement. Pt was able to roll in the bed with mod to max A. Noted to be able to reach overhead to head board and pull himself up in bed with little assist. Noted improved strength in B UE but testing was done in supine. Pt left in bed with nurse present. Pt will benefit from continued OT in the hospital and recommended venue below to increase strength, balance, and endurance for safe ADL's.         If plan is discharge home, recommend the following:  A lot of help with walking and/or transfers;A lot of help with bathing/dressing/bathroom;Assistance with feeding;Assistance with cooking/housework;Direct supervision/assist for medications management;Assist for transportation;Help with stairs or ramp for entrance   Equipment Recommendations  None recommended by OT          Precautions /  Restrictions Precautions Precautions: Fall Restrictions Weight Bearing Restrictions Per Provider Order: No       Mobility Bed Mobility Overal bed mobility: Needs Assistance Bed Mobility: Rolling Rolling: Mod assist, Max assist         General bed mobility comments: Assit to roll to L and R during supine peri-care.    Transfers                         Balance                                           ADL either performed or assessed with clinical judgement   ADL Overall ADL's : Needs assistance/impaired                           Toilet Transfer Details (indicate cue type and reason): Max A for peri-care at bed level. Toileting- Clothing Manipulation and Hygiene: Maximal assistance;Bed level              Extremity/Trunk Assessment Upper Extremity Assessment Upper Extremity Assessment: Generalized weakness;LUE deficits/detail;RUE deficits/detail RUE Deficits / Details: While supine pt was able to reach overhead and bull himself to scoot in bed. LUE Deficits / Details: In supine pt was able to  hold L arm at shoulder level against resistance. Pt did the same for elboe flexion. 40/5 grip strength.  Cognition Arousal: Alert Behavior During Therapy: Flat affect Overall Cognitive Status: Impaired/Different from baseline                                                             Pertinent Vitals/ Pain       Pain Assessment Pain Assessment: Faces Faces Pain Scale: Hurts little more Pain Location: L foot Pain Descriptors / Indicators: Guarding, Grimacing Pain Intervention(s): Monitored during session, Limited activity within patient's tolerance, Repositioned                                                          Frequency  Min 2X/week        Progress Toward Goals  OT Goals(current goals can now be found in the care plan  section)  Progress towards OT goals: Progressing toward goals  Acute Rehab OT Goals Patient Stated Goal: none stated OT Goal Formulation: Patient unable to participate in goal setting Time For Goal Achievement: 02/13/23 Potential to Achieve Goals: Fair ADL Goals Pt Will Perform Eating: with min assist Pt Will Perform Grooming: with min assist;sitting Pt Will Perform Upper Body Dressing: with min assist;sitting Pt Will Perform Lower Body Dressing: with min assist;sitting/lateral leans Pt Will Transfer to Toilet: with mod assist;stand pivot transfer Pt Will Perform Toileting - Clothing Manipulation and hygiene: with min assist;with mod assist;sitting/lateral leans Pt/caregiver will Perform Home Exercise Program: Increased strength;Increased ROM;Both right and left upper extremity;With minimal assist  Plan      Co-evaluation    PT/OT/SLP Co-Evaluation/Treatment: Yes Reason for Co-Treatment: To address functional/ADL transfers   OT goals addressed during session: ADL's and self-care                        End of Session    OT Visit Diagnosis: Unsteadiness on feet (R26.81);Other abnormalities of gait and mobility (R26.89);Muscle weakness (generalized) (M62.81);Other symptoms and signs involving the nervous system (R29.898)   Activity Tolerance Patient tolerated treatment well   Patient Left in bed;with call bell/phone within reach;with nursing/sitter in room   Nurse Communication          Time: 1914-7829 OT Time Calculation (min): 11 min  Charges: OT General Charges $OT Visit: 1 Visit  [facility] OT, MOT   Danie Chandler 02/08/2023, 11:17 AM

## 2023-02-08 NOTE — Progress Notes (Signed)
Palliative: Mr. Gildner is lying quietly in bed.  He appears acutely/chronically ill and frail.  He will briefly open his eyes, but not make or keep eye contact.  He keeps his eyes closed throughout most of our conversation.  He is able to tell me his name, and whisper where we are but not the month.  He asks for a "swab with juice".  I believe that he can make his basic needs known.  When asked how he is doing, Mr. Macha states "hurting" but is unable to tell me the location of his pain.  There is no family at bedside at this time.  Call to daughter, Authur Cubit.  Left somewhat detailed voicemail message.  Received immediate callback from daughter.  We talk about Mr. Hendon's acute health concerns and the treatment plan.  We talk about poor tolerance of tube feeding.  Charlynne Pander asks about trying a different tube feeding, and also GI consult as Mr. Pineo was recently diagnosed with IBS and they were to have an outpatient GI visit.   Conference with attending, RD, bedside nursing staff, transition of care team related to patient condition, needs, goals of care, disposition.  Plan: At this point continue to treat the treatable but no CPR or intubation.  Time for outcomes.  Daughter requesting trial of different tube feed due to intolerance of Jevity 1.5.        50 minutes Lillia Carmel, NP Palliative medicine team Team phone [phone]

## 2023-02-08 NOTE — Plan of Care (Signed)
  Problem: Education: Goal: Knowledge of General Education information will improve Description: Including pain rating scale, medication(s)/side effects and non-pharmacologic comfort measures Outcome: Progressing   Problem: Health Behavior/Discharge Planning: Goal: Ability to manage health-related needs will improve Outcome: Progressing   Problem: Clinical Measurements: Goal: Ability to maintain clinical measurements within normal limits will improve Outcome: Progressing Goal: Will remain free from infection Outcome: Progressing Goal: Diagnostic test results will improve Outcome: Progressing Goal: Respiratory complications will improve Outcome: Progressing Goal: Cardiovascular complication will be avoided Outcome: Progressing   Problem: Activity: Goal: Risk for activity intolerance will decrease Outcome: Progressing   Problem: Nutrition: Goal: Adequate nutrition will be maintained Outcome: Progressing   Problem: Coping: Goal: Level of anxiety will decrease Outcome: Progressing   Problem: Elimination: Goal: Will not experience complications related to bowel motility Outcome: Progressing Goal: Will not experience complications related to urinary retention Outcome: Progressing   Problem: Pain Managment: Goal: General experience of comfort will improve and/or be controlled Outcome: Progressing   Problem: Safety: Goal: Ability to remain free from injury will improve Outcome: Progressing   Problem: Skin Integrity: Goal: Risk for impaired skin integrity will decrease Outcome: Progressing   Problem: Education: Goal: Ability to describe self-care measures that may prevent or decrease complications (Diabetes Survival Skills Education) will improve Outcome: Progressing Goal: Individualized Educational Video(s) Outcome: Progressing   Problem: Coping: Goal: Ability to adjust to condition or change in health will improve Outcome: Progressing   Problem: Fluid  Volume: Goal: Ability to maintain a balanced intake and output will improve Outcome: Progressing   Problem: Health Behavior/Discharge Planning: Goal: Ability to identify and utilize available resources and services will improve Outcome: Progressing Goal: Ability to manage health-related needs will improve Outcome: Progressing   Problem: Metabolic: Goal: Ability to maintain appropriate glucose levels will improve Outcome: Progressing   Problem: Nutritional: Goal: Maintenance of adequate nutrition will improve Outcome: Progressing Goal: Progress toward achieving an optimal weight will improve Outcome: Progressing   Problem: Skin Integrity: Goal: Risk for impaired skin integrity will decrease Outcome: Progressing   Problem: Tissue Perfusion: Goal: Adequacy of tissue perfusion will improve Outcome: Progressing   Problem: Education: Goal: Knowledge of disease or condition will improve Outcome: Progressing Goal: Knowledge of secondary prevention will improve (MUST DOCUMENT ALL) Outcome: Progressing Goal: Knowledge of patient specific risk factors will improve (DELETE if not current risk factor) Outcome: Progressing   Problem: Ischemic Stroke/TIA Tissue Perfusion: Goal: Complications of ischemic stroke/TIA will be minimized Outcome: Progressing   Problem: Coping: Goal: Will verbalize positive feelings about self Outcome: Progressing Goal: Will identify appropriate support needs Outcome: Progressing   Problem: Health Behavior/Discharge Planning: Goal: Ability to manage health-related needs will improve Outcome: Progressing Goal: Goals will be collaboratively established with patient/family Outcome: Progressing   Problem: Self-Care: Goal: Ability to participate in self-care as condition permits will improve Outcome: Progressing Goal: Verbalization of feelings and concerns over difficulty with self-care will improve Outcome: Progressing Goal: Ability to communicate  needs accurately will improve Outcome: Progressing   Problem: Nutrition: Goal: Risk of aspiration will decrease Outcome: Progressing Goal: Dietary intake will improve Outcome: Progressing   Problem: Education: Goal: Knowledge of secondary prevention will improve (MUST DOCUMENT ALL) Outcome: Progressing   Problem: Education: Goal: Knowledge of secondary prevention will improve (MUST DOCUMENT ALL) Outcome: Progressing

## 2023-02-08 NOTE — Progress Notes (Signed)
Physical Therapy Treatment Patient Details Name: [name] MRN: [id number] DOB: 1955-12-06 Today's Date: 02/08/2023   History of Present Illness [name] is a 68 year old male with chronic combined systolic and diastolic heart failure, hyperlipidemia, type 2 diabetes mellitus, diabetic polyneuropathy, hypertension, polysubstance abuse, stage III CKD history of cocaine abuse, failure to thrive, coronary artery disease, ischemic cardiomyopathy, history of LV mural thrombus, OSA, history of Fournier gangrene, GERD, who was recently hospitalized for acute heart failure and discharged to [facility].     He was sent to the ED by EMS for lethargic and confusion since about 7 PM that evening.  He reportedly has normally been alert and oriented.  He was not answering questions.  Stroke was suspected and he was seen by teleneurologist.  TNK was not given as he was outside the window.  Neurology recommended MRI brain and routine stroke workup.  They recommended aspirin 325 mg daily.    PT Comments  Patient became more alert once seated at bedside and express need to have a bowel movement.  Patient able to partially lift bottom off bed but unable to extend knees for standing due to BLE weakness during attempt to transfer to [facility].  Patient able to roll side to side for cleaning and demonstrated fair/good return for using BUE to help pull self up to [facility] when repositioning in bed.  Patient will benefit from continued skilled physical therapy in hospital and recommended venue below to increase strength, balance, endurance for safe ADLs and gait.     If plan is discharge home, recommend the following: A lot of help with bathing/dressing/bathroom;A lot of help with walking and/or transfers;Help with stairs or ramp for entrance;Assistance with cooking/housework   Can travel by private vehicle     No  Equipment Recommendations  None recommended by PT    Recommendations for Other Services        Precautions / Restrictions Precautions Precautions: Fall Restrictions Weight Bearing Restrictions Per Provider Order: No     Mobility  Bed Mobility Overal bed mobility: Needs Assistance Bed Mobility: Supine to Sit, Sit to Supine, Rolling Rolling: Mod assist, Max assist   Supine to sit: Mod assist, Max assist Sit to supine: Max assist   General bed mobility comments: increased time, labored movement    Transfers Overall transfer level: Needs assistance Equipment used: Standard walker Transfers: Sit to/from Stand Sit to Stand: Max assist           General transfer comment: able to partially lift bottom off bed, but to weak to fully extend knees    Ambulation/Gait                   Stairs             Wheelchair Mobility     Tilt Bed    Modified Rankin (Stroke Patients Only)       Balance Overall balance assessment: Needs assistance Sitting-balance support: Feet supported, No upper extremity supported Sitting balance-Leahy Scale: Poor Sitting balance - Comments: fair/poor seated at EOB Postural control: Posterior lean Standing balance support: Reliant on assistive device for balance, During functional activity, Bilateral upper extremity supported Standing balance-Leahy Scale: Zero Standing balance comment: poor/zero, only able to lift bottom off bed using RW                            Cognition Arousal: Alert Behavior During Therapy: Flat affect Overall Cognitive Status:  No family/caregiver present to determine baseline cognitive functioning Area of Impairment: Attention                   Current Attention Level: Alternating           General Comments: Frequent verbal cuing and repeating of directions        Exercises General Exercises - Lower Extremity Heel Slides: Supine, 10 reps, Both, AAROM, Strengthening    General Comments        Pertinent Vitals/Pain Pain Assessment Pain Assessment: No/denies pain     Home Living                          Prior Function            PT Goals (current goals can now be found in the care plan section) Acute Rehab PT Goals Patient Stated Goal: return home PT Goal Formulation: With patient Time For Goal Achievement: 02/12/23 Potential to Achieve Goals: Fair Progress towards PT goals: Progressing toward goals    Frequency    Min 3X/week      PT Plan      Co-evaluation PT/OT/SLP Co-Evaluation/Treatment: Yes Reason for Co-Treatment: To address functional/ADL transfers PT goals addressed during session: Mobility/safety with mobility;Balance;Proper use of DME OT goals addressed during session: ADL's and self-care      AM-PAC PT "6 Clicks" Mobility   Outcome Measure  Help needed turning from your back to your side while in a flat bed without using bedrails?: A Lot Help needed moving from lying on your back to sitting on the side of a flat bed without using bedrails?: A Lot Help needed moving to and from a bed to a chair (including a wheelchair)?: Total Help needed standing up from a chair using your arms (e.g., wheelchair or bedside chair)?: Total Help needed to walk in hospital room?: Total Help needed climbing 3-5 steps with a railing? : Total 6 Click Score: 8    End of Session   Activity Tolerance: Patient tolerated treatment well;Patient limited by fatigue Patient left: in bed;with call bell/phone within reach Nurse Communication: Mobility status PT Visit Diagnosis: Unsteadiness on feet (R26.81);Other abnormalities of gait and mobility (R26.89);Muscle weakness (generalized) (M62.81)     Time: 1308-6578 PT Time Calculation (min) (ACUTE ONLY): 30 min  Charges:    $Therapeutic Activity: 23-37 mins PT General Charges $$ ACUTE PT VISIT: 1 Visit                     12:35 PM, 02/08/23 [name], MPT Physical Therapist with [facility] [phone] office [phone] mobile phone

## 2023-02-08 NOTE — Progress Notes (Signed)
Notified MD that patient was unable to tolerate tube feedings. Once tube feeding were initiated patient began to grimace and yell in discomfort. No new orders obtained.   After tube feeding were stopped patient rested comfortably.

## 2023-02-09 DIAGNOSIS — Z7189 Other specified counseling: Secondary | ICD-10-CM

## 2023-02-09 DIAGNOSIS — Z515 Encounter for palliative care: Secondary | ICD-10-CM

## 2023-02-09 DIAGNOSIS — I69391 Dysphagia following cerebral infarction: Secondary | ICD-10-CM | POA: Diagnosis not present

## 2023-02-09 DIAGNOSIS — I639 Cerebral infarction, unspecified: Secondary | ICD-10-CM | POA: Diagnosis not present

## 2023-02-09 LAB — GLUCOSE, CAPILLARY
Glucose-Capillary: 234 mg/dL — ABNORMAL HIGH (ref 70–99)
Glucose-Capillary: 263 mg/dL — ABNORMAL HIGH (ref 70–99)
Glucose-Capillary: 224 mg/dL — ABNORMAL HIGH (ref 70–99)
Glucose-Capillary: 164 mg/dL — ABNORMAL HIGH (ref 70–99)

## 2023-02-09 MED ORDER — OSMOLITE 1.5 CAL PO LIQD
1000.0000 mL | ORAL | Status: DC
  Administered 2023-02-09: 1000 mL

## 2023-02-09 NOTE — Plan of Care (Signed)
?  Problem: Education: ?Goal: Knowledge of General Education information will improve ?Description: Including pain rating scale, medication(s)/side effects and non-pharmacologic comfort measures ?Outcome: Progressing ?  ?Problem: Health Behavior/Discharge Planning: ?Goal: Ability to manage health-related needs will improve ?Outcome: Progressing ?  ?Problem: Coping: ?Goal: Level of anxiety will decrease ?Outcome: Progressing ?  ?

## 2023-02-09 NOTE — Plan of Care (Signed)
  Problem: Activity: Goal: Risk for activity intolerance will decrease Outcome: Not Progressing   Problem: Coping: Goal: Level of anxiety will decrease Outcome: Not Progressing   Problem: Nutrition: Goal: Adequate nutrition will be maintained Outcome: Not Progressing   Problem: Pain Managment: Goal: General experience of comfort will improve and/or be controlled Outcome: Not Progressing   Problem: Safety: Goal: Ability to remain free from injury will improve Outcome: Not Progressing

## 2023-02-09 NOTE — Progress Notes (Addendum)
PROGRESS NOTE   [name]  ZOX:[id number] DOB: 1955/07/15 DOA: 01/28/2023 PCP: Benetta Spar, MD   Chief Complaint  Patient presents with   Altered Mental Status   Level of care: Med-Surg  Brief Admission History:  [name] is a 68 year old male with chronic combined systolic and diastolic heart failure, hyperlipidemia, type 2 diabetes mellitus, diabetic polyneuropathy, hypertension, polysubstance abuse, stage III CKD history of cocaine abuse, failure to thrive, coronary artery disease, ischemic cardiomyopathy, history of LV mural thrombus, OSA, history of Fournier gangrene, GERD, who was recently hospitalized for acute heart failure and discharged to [facility].   He was sent to the ED by EMS for lethargic and confusion since about 7 PM that evening.  He reportedly has normally been alert and oriented.  He was not answering questions.  Stroke was suspected and he was seen by teleneurologist.  TNK was not given as he was outside the window.  Neurology recommended MRI brain and routine stroke workup.  They recommended aspirin 325 mg daily.     Assessment and Plan: 1)Acute ischemic CVA with Dysphagia - MRI brain positive for small acute infarct in the left insular white matter -Neurology consult appreciated -A1c 7.2, LDL 29 -TTE with bubble study without evidence of LV thrombus, EF is 25 to 30% with wall motion abnormalities, Agitated saline contrast bubble study was negative, with no evidence of any interatrial shunt , no mitral stenosis, no aortic stenosis - c/n  ASA 325mg  daily and atorvastatin for secondary prevention.  2)Meningioma in the Right Middle Cranial Fossa with Edema in the adjacent Temporal Lobe-- - Given his persistent encephalopathy, neurosurgeon Dr. Danielle Dess on 01/30/23 - Recommend trial of dexamethasone to see if patient improves.   -He recommended 4 mg BID.  If patient improves on this, he said to discharge patient on oral decadron and follow up  with him in the office to discuss surgical removal of the meningioma. 02/07/23 Mentation -improving  -I reached out to Dr. Barnett Abu (Neurosurgeon) --[phone]-- --he recommends tapering down Decadron from 4 mg twice daily to 2 mg twice daily for 1 week then 1 mg twice daily for 1 week then 1 mg daily for 1 week then stop.  3)Acute Encephalopathy -due to #1 #2 and # 4 above -Neurology Consult appreciated - EEG no epileptic activity seen; c/w encephalopathy -Neurology, palliative care and neurosurgery input appreciated --please see above 02/07/23 -Serum ammonia is not elevated -ABG without elevated CO2 --Mentation -improving slowly -- 4)HyperNatremia/Hyperchloremia--- pt has free water deficits due to Stroke Related Dysphagia -Sodium and chloride improving with free water flushes via PEG and IV fluids -Follow BMP  5)Dysphagia with Swallowing Difficulties/Nutrition--- due to #1 #2 above -Speech therapy appreciated -Status post PEG tube placement on 02/02/2023 02/07/23 -Patient did well on IV Reglan--was on the routine tube feeding without emesis Dose of IV Reglan was around noon on 02/06/2023--without IV regular patient on 02/07/2023 started to have episode of emesis again -iv Reglan restarted on 02/07/2023 for GI prokinetic effects--tube feeding has been resumed and following discussions with patient's healthcare power of attorney GI service has been consulted. -Abdominal x-ray not demonstrating any obstruction and tube in adequate place. -Dietitian consult for tube feeding appreciated; continue to follow recommendations. --Speech pathologist consult requested--to see if patient can have trial of oral intake. -After discussing with dietitian to feedings has been changed from Jevity to Osmolite to assess for tolerance.  6)AKI----acute kidney injury on CKD stage -3B  - Creatinine improving with increased  water flushes and tube feeding---hydration - renally adjust medications, avoid  nephrotoxic agents / dehydration  / hypotension   7)Acute respiratory failure with hypoxia due to Aspiration pneumonia - treated with  IV ampicillin sulbactam , okay to complete Augmentin via PEG -Currently requiring 2 L of oxygen via nasal cannula -Continue bronchodilators and mucolytics   8)DM2 A1c 7.2 reflecting uncontrolled DM with hyperglycemia PTA Use Novolog/Humalog Sliding scale insulin with Accu-Cheks/Fingersticks as ordered   9)History of polysubstance abuse including cocaine in the past - he is currently residing in a SNF - UDS negative    10)Normocytic Anemia in the setting of underlying CKD 3B - -Hgb currently greater than 8 -No bleeding concerns monitor closely and transfuse as clinically indicated    11)Social/ethics--- palliative care consult appreciated patient is a DNR/DNI Goals of care Discussion: We discussed goals of care for [name] .   -- Patient with acute infarct in the left insular white matter (MRI Brain 01/28/23) -Known meningioma in the right middle cranial fossa with edema in the adjacent temporal lobe (MRI Brain 01/28/23) -Stroke related dysphagia--status post PEG tube placement on 02/02/23 -Poor tolerance of tube feeding with high residuals -Persistent hypernatremia due to free water deficit in the setting of lack of oral intake -- Discussed with patient's daughter that overall prognosis is poor and very low purposeful recovery rate anticipated. -- [name] verbalizes understanding of overall poor prognosis. -She requested will continue current level of care -Patient remains DNR/DNI -Patient daughter will consider de-escalation to hospice if patient does Not improve over the next several days. -If patient improves over the next several days he may be able to go to rehab.  12)PAD/CAD--status post prior angioplasty and stent placement -Continue aspirin, atorvastatin and metoprolol  13)HFrEF--chronic systolic dysfunction CHF---??  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 The entire inferior wall, apical lateral segment,  apical septal segment, and apical anterior segment are akinetic. The  anterior wall, antero-lateral wall, anterior septum, mid inferoseptal segment, and  basal inferoseptal segment are hypokinetic.  -c/n Metoprolol  avoid ACEI/ARB/ARNI and  aldactone due to renal concerns Ok to use Hydralazine/Isosordil combo in lieu of ACEI/ARB -Home torsemide on hold due to AKI and dehydration concerns  14)Pressure injury to sacrum stage 2, present on admission  -Nursing care as outlined -Continue constant repositioning and preventive measures.  15) moderate protein calorie malnutrition -Secondary to residual dysphagia from stroke -continue tube feedings.   Code status:   Code Status: Limited: Do not attempt resuscitation (DNR) -DNR-LIMITED -Do Not Intubate/DNI     DVT prophylaxis: sq heparin  Code Status: DNR/DNI Family Communication: daughter is primary contact Disposition: anticipate SNF rehab (-Hold discharge to SNF until tolerating tube feeding well)   Consultants:  Neurology  Palliative care General Surgery for PEG tube placement on 02/02/2023  Procedures:  PEG tube 01/30/2023   Subjective: No fever, no chest pain, no nausea and no vomiting reported overnight.   Objective: Vitals:   02/08/23 2034 02/09/23 0537 02/09/23 0841 02/09/23 1421  BP: 115/76 103/81 (!) 143/80 112/72  Pulse: 72 72 77 68  Resp: 20 20  20   Temp: 98.4 F (36.9 C) 98 F (36.7 C)    TempSrc:      SpO2: 92%  93% 96%  Weight:  89.5 kg    Height:        Intake/Output Summary (Last 24 hours) at 02/09/2023 1439 Last data filed at 02/09/2023 1050 Gross per 24 hour   Intake 0 ml  Output 1636 ml  Net -1636 ml   Filed Weights   02/07/23 0704 02/08/23 0644 02/09/23 0537  Weight: 95.1 kg 90.6 kg 89.5 kg   Physical Exam General exam: Somnolent on today's examination after receiving medication for agitation.  No chest pain, no nausea or vomiting reported. Respiratory system: 2 L nasal cannula in place; good saturation appreciated.  No wheezing or crackles on exam. Cardiovascular system: Rate controlled, no rubs, no gallops, no JVD. Gastrointestinal system: Abdomen is obese, nondistended, soft and nontender.  Bowel sounds appreciated.  PEG tube in place. Central nervous system: No new focal neurological deficits.  Positive residual left hemiparesis, dysarthria and difficulty swallowing. Extremities: No cyanosis or clubbing. Skin: No petechiae.  Stage II sacral pressure injury without signs of superimposed infection.  Present at the end of admission. Psychiatry: Mood & affect appropriate.   Data Reviewed: I have personally reviewed following labs and imaging studies  CBC: Recent Labs  Lab 02/04/23 1100 02/08/23 0425  WBC  --  25.8*  HGB 8.2* 9.6*  HCT 30.9* 35.8*  MCV  --  77.5*  PLT  --  269    Basic Metabolic Panel: Recent Labs  Lab 02/03/23 0412 02/03/23 1629 02/04/23 0526 02/04/23 1100 02/05/23 0548 02/06/23 0444 02/07/23 0407 02/08/23 0425  NA 153*  --   --  156* 155* 145 145 149*  K 4.1  --   --  4.1 4.4 4.3 4.4 3.9  CL 118*  --   --  119* 120* 113* 112* 112*  CO2 24  --   --  27 26 24 24 29   GLUCOSE 219*  --   --  291* 245* 306* 296* 176*  BUN 61*  --   --  74* 76* 76* 73* 57*  CREATININE 1.83*  --   --  1.84* 1.83* 1.62* 1.49* 1.25*  CALCIUM 8.8*  --   --  8.7* 8.8* 8.5* 8.7* 8.7*  MG 2.8* 2.7* 2.9*  --   --   --   --   --   PHOS 3.7 3.8 3.7  --   --   --   --  2.7   CBG: Recent Labs  Lab 02/08/23 1116 02/08/23 1637 02/08/23 2031 02/09/23 0747 02/09/23 1125  GLUCAP 173* 185* 163* 234* 263*    No results found for  this or any previous visit (from the past 240 hours).    Radiology Studies: DG Abd 1 View Result Date: 02/07/2023 CLINICAL DATA:  Persistent emesis. EXAM: ABDOMEN - 1 VIEW COMPARISON:  02/03/2023 FINDINGS: Technically limited due to difficulty with positioning. Patient is rotated. Gastrostomy tube is faintly visualized. No gaseous bowel distension to suggest obstruction. There is formed stool in the distal colon. Portions of the left abdomen are not included in the field of view. IMPRESSION: No bowel obstruction or abnormal distention. Small volume formed stool in the distal colon. Electronically Signed   By: Narda Rutherford M.D.   On: 02/07/2023 19:23    Scheduled Meds:  aspirin  325 mg Per Tube Daily   atorvastatin  40 mg Per Tube QHS   Chlorhexidine Gluconate Cloth  6 each Topical Daily   dexamethasone (DECADRON) injection  2 mg Intravenous Q12H   feeding supplement (PROSource TF20)  60 mL Per Tube BID   ferrous sulfate  300 mg Per Tube Daily   folic acid  1 mg Per Tube Daily   free water  200 mL Per Tube Q4H   gabapentin  100 mg Per Tube QHS   heparin  5,000 Units Subcutaneous Q8H   hydrALAZINE  25 mg Per Tube BID   insulin aspart  0-10 Units Subcutaneous QHS   insulin aspart  0-20 Units Subcutaneous TID WC   isosorbide dinitrate  5 mg Per Tube BID   linaclotide  290 mcg Per Tube QAC breakfast   metoCLOPramide (REGLAN) injection  10 mg Intravenous Q6H   metoprolol tartrate  25 mg Per Tube BID   multivitamin with minerals  1 tablet Per Tube Daily   Continuous Infusions:  feeding supplement (OSMOLITE 1.5 CAL) 1,000 mL (02/09/23 1029)    LOS: 12 days    Vassie Loll, MD  02/09/2023, 2:39 PM

## 2023-02-09 NOTE — Progress Notes (Signed)
Nutrition Follow-up  DOCUMENTATION CODES:   Non-severe (moderate) malnutrition in context of acute illness/injury  INTERVENTION:   Change TF to Osmolite 1.5 at 10 ml/h. If tolerated, recommend increase by 10 ml every 8 hours to goal rate of 60 ml/h.  Continue Prosource TF20 60 ml BID.  TF regimen provides 2320 kcal, 130 gm protein, 1097 ml free water daily.  Free water flushes 200 ml q4h for a total of 2297 ml free water daily.  Continue MVI with minerals daily via tube.   If vomiting recurs, may need to convert G-tube into G-J tube.   NUTRITION DIAGNOSIS:   Moderate Malnutrition related to acute illness (acute CVA) as evidenced by mild muscle depletion, mild fat depletion.  Ongoing   GOAL:   Patient will meet greater than or equal to 90% of their needs  Progressing   MONITOR:   TF tolerance, Diet advancement  REASON FOR ASSESSMENT:   Rounds    ASSESSMENT:   68 yo male admitted with acute ischemic CVA. PMH includes HLD, HTN, CAD, CKD, ischemic cardiomyopathy, cocaine abuse, DM-2, CHF. Recent admission for acute HF, discharged to SNF on 01/17/23.  PEG placed 1/23; started Jevity 1.5 at 20 ml/h, with goal rate of 60 ml/h. TF held 1/24 for high residual ~1400 ml per RN. Reglan added. Abd film showed PEG in appropriate position.  Last Reglan dose was 1/27 at 11:48. Jevity 1.5 was resumed 1/25 at 10 ml/h, slowly advanced to 20 ml/h and patient tolerated until 1/28 AM when he vomited.  4 emesis episodes documented on 1/28. Reglan restarted 1/28, currently receiving reglan IV 10 ml q6h.  Labs reviewed. Na 149 CBG: [phone]  Medications reviewed and include decadron, ferrous sulfate, folic acid, novolog, reglan, MVI with minerals.  Admission weight: 95.3 kg Current weight: 89.5 kg  Diet Order:   Diet Order     None       EDUCATION NEEDS:   No education needs have been identified at this time  Skin:  Skin Assessment: Skin Integrity Issues: Skin  Integrity Issues:: Stage II Stage II: sacrum  Last BM:  1/27 type 6  Height:   Ht Readings from Last 1 Encounters:  01/28/23 5\' 9"  (1.753 m)    Weight:   Wt Readings from Last 1 Encounters:  02/09/23 89.5 kg    Ideal Body Weight:  72.7 kg  BMI:  Body mass index is 29.14 kg/m.  Estimated Nutritional Needs:   Kcal:  2200-2400  Protein:  120-140 gm  Fluid:  2.2-2.4 L   Gabriel Rainwater RD, LDN, CNSC Contact Inpatient RD using Secure Chat. If unavailable, use group chat "RD Inpatient" via Secure Chat in [employer].

## 2023-02-09 NOTE — Progress Notes (Signed)
Palliative:  Mr. [name] is lying quietly in bed.  He appears acutely/chronically ill and frail.  He is will briefly open his eyes but not make or keep eye contact.  He is mumbling his name today but is unable to tell me where we are.  I do not believe that he can make his basic needs known.  There is no family at bedside at this time.  Mr. [name] was admitted for acute CVA and has continued to have confusion, lethargy, and inability to safely eat.  He has been seen by speech therapy several times.  PEG tube was placed 1/23 for limited time trial (6 to 8 weeks).  Unfortunately, Mr. [name] has not been tolerating Jevity 1.5.  Dietitian has planned trial of Osmolite 1.5.   Call to daughter/HCPOA, Judith Blonder.  We talk about Mr. [name]'s acute and chronic health concerns.  We talk about PEG tube feeding intolerance and registered dietitian's recommendations.  We also talk about physical therapy treatments.  We talked about time for outcome and continued plan for short-term rehab.  All questions addressed.  Encouraged to share any concerns with hospitalist.  Conference with attending, bedside nursing staff, registered dietitian, transition of care team related to patient condition, needs, goals of care, disposition.  Plan: At this point continue to treat the treatable but no CPR or intubation.  Time for outcomes.  PEG tube placed 1/23 for limited (6 to 8 weeks) time trial.  Trial of different tube feeding as intolerance to Jevity 1.5.  Time for outcomes.  Short-term rehab if/when able.  Anticipate return to [facility] where he [facility] for short-term rehab.  Mr. [name] has had 5 hospital stays and 6 ED visits in the last 6 months.  50 minutes Lillia Carmel, NP Palliative medicine team Team phone [phone]

## 2023-02-10 ENCOUNTER — Ambulatory Visit: Payer: 59 | Admitting: Urology

## 2023-02-10 DIAGNOSIS — F1490 Cocaine use, unspecified, uncomplicated: Secondary | ICD-10-CM

## 2023-02-10 DIAGNOSIS — Z515 Encounter for palliative care: Secondary | ICD-10-CM | POA: Diagnosis not present

## 2023-02-10 DIAGNOSIS — Z7189 Other specified counseling: Secondary | ICD-10-CM | POA: Diagnosis not present

## 2023-02-10 DIAGNOSIS — I69391 Dysphagia following cerebral infarction: Secondary | ICD-10-CM | POA: Diagnosis not present

## 2023-02-10 DIAGNOSIS — I639 Cerebral infarction, unspecified: Secondary | ICD-10-CM | POA: Diagnosis not present

## 2023-02-10 LAB — GLUCOSE, CAPILLARY
Glucose-Capillary: 182 mg/dL — ABNORMAL HIGH (ref 70–99)
Glucose-Capillary: 219 mg/dL — ABNORMAL HIGH (ref 70–99)
Glucose-Capillary: 218 mg/dL — ABNORMAL HIGH (ref 70–99)
Glucose-Capillary: 233 mg/dL — ABNORMAL HIGH (ref 70–99)

## 2023-02-10 MED ORDER — ORAL CARE MOUTH RINSE: 15.0000 mL | OROMUCOSAL | Status: DC | PRN

## 2023-02-10 MED ORDER — ORAL CARE MOUTH RINSE
15.0000 mL | OROMUCOSAL | Status: DC
  Administered 2023-02-10 – 2023-02-14 (×17): 15 mL via OROMUCOSAL

## 2023-02-10 MED ORDER — OSMOLITE 1.5 CAL PO LIQD
1000.0000 mL | ORAL | Status: DC
  Administered 2023-02-12 – 2023-02-13 (×2): 1000 mL

## 2023-02-10 NOTE — Progress Notes (Signed)
PROGRESS NOTE   [name]  [name] DOB: August 17, 1955 DOA: 01/28/2023 PCP: Russell Spar, MD   Chief Complaint  Patient presents with   Altered Mental Status   Level of care: Med-Surg  Brief Admission History:  [name] is a 68 year old male with chronic combined systolic and diastolic heart failure, hyperlipidemia, type 2 diabetes mellitus, diabetic polyneuropathy, hypertension, polysubstance abuse, stage III CKD history of cocaine abuse, failure to thrive, coronary artery disease, ischemic cardiomyopathy, history of LV mural thrombus, OSA, history of Fournier gangrene, GERD, who was recently hospitalized for acute heart failure and discharged to [facility].   He was sent to the ED by EMS for lethargic and confusion since about 7 PM that evening.  He reportedly has normally been alert and oriented.  He was not answering questions.  Stroke was suspected and he was seen by teleneurologist.  TNK was not given as he was outside the window.  Neurology recommended MRI brain and routine stroke workup.  They recommended aspirin 325 mg daily.     Assessment and Plan: 1)Acute ischemic CVA with Dysphagia - MRI brain positive for small acute infarct in the left insular white matter -Neurology consult appreciated -A1c 7.2, LDL 29 -TTE with bubble study without evidence of LV thrombus, EF is 25 to 30% with wall motion abnormalities, Agitated saline contrast bubble study was negative, with no evidence of any interatrial shunt , no mitral stenosis, no aortic stenosis - c/n ASA 325mg  daily and atorvastatin for secondary prevention.  2)Meningioma in the Right Middle Cranial Fossa with Edema in the adjacent Temporal Lobe-- - Given his persistent encephalopathy, neurosurgeon Dr. Danielle Dess on 01/30/23 - Recommend trial of dexamethasone to see if patient improves.   -He recommended 4 mg BID.  If patient improves on this, he said to discharge patient on oral decadron and follow up  with him in the office to discuss surgical removal of the meningioma. 02/07/23 -Mentation -improving  -I reached out to Dr. Barnett Abu (Neurosurgeon) --[phone]-- --he recommends tapering down Decadron from 4 mg twice daily to 2 mg twice daily for 1 week (until 02/12/23); then 1 mg twice daily for 1 week (starting on 02/13/23); then 1 mg daily for 1 week (starting 02/21/23) then stop.  3)Acute Encephalopathy -due to #1 #2 and # 4 above -Neurology Consult appreciated - EEG no epileptic activity seen; c/w encephalopathy -Neurology, palliative care and neurosurgery input appreciated --please see above 02/07/23 -Serum ammonia is not elevated -ABG without elevated CO2 --Mentation -improved from yesterday; patient following commands  4)HyperNatremia/Hyperchloremia--- pt has free water deficits due to Stroke Related Dysphagia -Sodium and chloride improving with free water flushes via PEG and IV fluids -Follow BMP  5)Dysphagia with Swallowing Difficulties/Nutrition--- due to #1 #2 above -Speech therapy appreciated -Status post PEG tube placement on 02/02/2023 02/07/23 -Patient did well on IV Reglan--was on the routine tube feeding without emesis Dose of IV Reglan was around noon on 02/06/2023--without IV regular patient on 02/07/2023 started to have episode of emesis again -iv Reglan restarted on 02/07/2023 for GI prokinetic effects--tube feeding has been resumed and following discussions with patient's healthcare power of attorney GI service has been consulted. -Abdominal x-ray not demonstrating any obstruction and tube in adequate place. -Dietitian consult for tube feeding appreciated; continue to follow recommendations. --Speech pathologist consult requested--to see if patient can have trial of oral intake. -After discussing with dietitian to feedings has been changed from Jevity to Osmolite to assess for tolerance. -Speech pathologist recommending NPO  still after trial session on  02/10/23.  6)AKI----acute kidney injury on CKD stage -3B  - Creatinine improving with increased water flushes and tube feeding---hydration - renally adjust medications, avoid nephrotoxic agents / dehydration  / hypotension   7)Acute respiratory failure with hypoxia due to Aspiration pneumonia - treated with  IV ampicillin sulbactam , okay to complete Augmentin via PEG -Currently requiring 2 L of oxygen via nasal cannula -Continue bronchodilators and mucolytics   8)DM2 A1c 7.2 reflecting uncontrolled DM with hyperglycemia PTA Use Novolog/Humalog Sliding scale insulin with Accu-Cheks/Fingersticks as ordered   9)History of polysubstance abuse including cocaine in the past - he is currently residing in a SNF - UDS negative    10)Normocytic Anemia in the setting of underlying CKD 3B - -Hgb currently greater than 8 -No bleeding concerns monitor closely and transfuse as clinically indicated  11) leukocytosis -No frank history source or signs of infection currently -Most likely associated with steroid usage -Patient is afebrile -Continue to follow WBCs trend.    12)Social/ethics--- palliative care consult appreciated patient is a DNR/DNI Goals of care Discussion: We discussed goals of care for [name] .   -- Patient with acute infarct in the left insular white matter (MRI Brain 01/28/23) -Known meningioma in the right middle cranial fossa with edema in the adjacent temporal lobe (MRI Brain 01/28/23) -Stroke related dysphagia--status post PEG tube placement on 02/02/23 -Poor tolerance of tube feeding with high residuals -Persistent hypernatremia due to free water deficit in the setting of lack of oral intake -- Discussed with patient's daughter that overall prognosis is poor and very low purposeful recovery rate anticipated. -- [name] verbalizes understanding of overall poor prognosis. -She requested will continue current level of care -Patient remains DNR/DNI -Patient  daughter will consider de-escalation to hospice if patient does Not improve over the next several days. -If patient improves over the next several days he may be able to go to rehab.  13)PAD/CAD--status post prior angioplasty and stent placement -Continue aspirin, atorvastatin and metoprolol  14)HFrEF--chronic systolic dysfunction CHF---??  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 The entire inferior wall, apical lateral segment,  apical septal segment, and apical anterior segment are akinetic. The  anterior wall, antero-lateral wall, anterior septum, mid inferoseptal segment, and  basal inferoseptal segment are hypokinetic.  -c/n Metoprolol  avoid ACEI/ARB/ARNI and  aldactone due to renal concerns Ok to use Hydralazine/Isosordil combo in lieu of ACEI/ARB -Home torsemide on hold due to AKI and dehydration concerns  15)Pressure injury to sacrum stage 2, present on admission  -Nursing care as outlined -Continue constant repositioning and preventive measures.  16) moderate protein calorie malnutrition -Secondary to residual dysphagia from stroke -continue tube feedings.   Code status:   Code Status: Limited: Do not attempt resuscitation (DNR) -DNR-LIMITED -Do Not Intubate/DNI     DVT prophylaxis: sq heparin  Code Status: DNR/DNI Family Communication: daughter is primary contact Disposition: anticipate SNF rehab (-Holding discharge to SNF until tolerating tube feeding well; rate currently advancing after switching him to osmolite )   Consultants:  Neurology  Palliative care General Surgery for PEG tube placement on 02/02/2023  Procedures:  PEG tube 01/30/2023   Subjective: Patient expressed some nausea. No fever, no  chest pain, and  no vomiting reported overnight. Patient states he feels better.  Objective: Vitals:   02/09/23 0841 02/09/23 1421 02/09/23 2243 02/10/23 0526  BP: (!) 143/80 112/72 119/78 112/75  Pulse: 77 68 63 72  Resp:  20 19 18   Temp:   (!) 97.4 F (36.3 C) 97.6 F (36.4 C)  TempSrc:   Oral Oral  SpO2: 93% 96% 100% 100%  Weight:    90.7 kg  Height:        Intake/Output Summary (Last 24 hours) at 02/10/2023 1243 Last data filed at 02/10/2023 1030 Gross per 24 hour  Intake 654.33 ml  Output 1750 ml  Net -1095.67 ml   Filed Weights   02/08/23 0644 02/09/23 0537 02/10/23 0526  Weight: 90.6 kg 89.5 kg 90.7 kg   Physical Exam General exam: More alert today, and following commands. No vomiting or chest pain reported.  Respiratory system: 2L nasal canula in place with adequate O2 saturation. No wheezing or crackles on auscultation.  Cardiovascular system: Rate controlled, no rubs or gallops. No JVD.  Gastrointestinal system: Abdomen is obese, non distended, soft, and non tender. PEG tube in place. Positive bowel sounds.  Central nervous system: No new focal neurological deficits. Following basic commands today. Positive residual left hemiparesis, dysarthria, and difficulty swallowing.  Extremities: No cyanosis or clubbing. Skin: Stage II sacral pressure injury without signs of infection. Present on admission. No petechiae Psychiatry: Mood & affect appropriate.    Data Reviewed: I have personally reviewed following labs and imaging studies  CBC: Recent Labs  Lab 02/04/23 1100 02/08/23 0425  WBC  --  25.8*  HGB 8.2* 9.6*  HCT 30.9* 35.8*  MCV  --  77.5*  PLT  --  269    Basic Metabolic Panel: Recent Labs  Lab 02/03/23 1629 02/04/23 0526 02/04/23 1100 02/05/23 0548 02/06/23 0444 02/07/23 0407 02/08/23 0425  NA  --   --  156* 155* 145 145 149*  K  --   --  4.1 4.4 4.3 4.4 3.9  CL  --   --  119* 120* 113* 112* 112*  CO2  --   --  27 26 24 24 29   GLUCOSE  --    --  291* 245* 306* 296* 176*  BUN  --   --  74* 76* 76* 73* 57*  CREATININE  --   --  1.84* 1.83* 1.62* 1.49* 1.25*  CALCIUM  --   --  8.7* 8.8* 8.5* 8.7* 8.7*  MG 2.7* 2.9*  --   --   --   --   --   PHOS 3.8 3.7  --   --   --   --  2.7   CBG: Recent Labs  Lab 02/09/23 1125 02/09/23 1627 02/09/23 2246 02/10/23 0736 02/10/23 1114  GLUCAP 263* 224* 164* 182* 219*    No results found for this or any previous visit (from the past 240 hours).    Radiology Studies: No results found.   Scheduled Meds:  aspirin  325 mg Per Tube Daily   atorvastatin  40 mg Per Tube QHS   dexamethasone (DECADRON) injection  2 mg Intravenous Q12H   feeding supplement (PROSource TF20)  60 mL Per Tube BID   ferrous sulfate  300 mg Per Tube Daily   folic acid  1 mg Per Tube Daily   free water  200 mL Per Tube Q4H   gabapentin  100 mg Per Tube QHS   heparin  5,000 Units Subcutaneous Q8H  hydrALAZINE  25 mg Per Tube BID   insulin aspart  0-10 Units Subcutaneous QHS   insulin aspart  0-20 Units Subcutaneous TID WC   isosorbide dinitrate  5 mg Per Tube BID   linaclotide  290 mcg Per Tube QAC breakfast   metoCLOPramide (REGLAN) injection  10 mg Intravenous Q6H   metoprolol tartrate  25 mg Per Tube BID   multivitamin with minerals  1 tablet Per Tube Daily   Continuous Infusions:  feeding supplement (OSMOLITE 1.5 CAL) 1,000 mL (02/09/23 1029)    LOS: 13 days    Vassie Loll, MD  02/10/2023, 12:43 PM

## 2023-02-10 NOTE — Progress Notes (Signed)
Osmolite running at 20 mL/ h. Pt vomited a small amount and 20 cc residual noted. MD notified. Tube feed was to be increased by 10 at 2200, holding off currently per MD order d/t emesis occurrence.

## 2023-02-10 NOTE — TOC Progression Note (Signed)
Transition of Care [facility]) - Progression Note    Patient Details  Name: [name] MRN: [id number] Date of Birth: 15-Nov-1955  Transition of Care [facility]) CM/SW Contact  Isabella Bowens, [place] Phone Number: 02/10/2023, 10:16 AM  Clinical Narrative:    CSW spoke with Debbie at [facility] regarding pt and possible DC on Sunday. Eunice Blase stated that they do accept weekend admissions and for the CSW over weekend to follow up. TOC will continue to follow.    Expected Discharge Plan: Skilled Nursing Facility Barriers to Discharge: Continued Medical Work up  Expected Discharge Plan and Services In-house Referral: Clinical Social Work Discharge Planning Services: CM Consult Post Acute Care Choice: Skilled Nursing Facility Living arrangements for the past 2 months: Skilled Nursing Facility                      Social Determinants of Health (SDOH) Interventions SDOH Screenings   Food Insecurity: Patient Unable To Answer (01/28/2023)  Housing: Patient Unable To Answer (01/28/2023)  Transportation Needs: Patient Unable To Answer (01/28/2023)  Utilities: Patient Unable To Answer (01/28/2023)  Alcohol Screen: Low Risk  (12/14/2022)  Depression (PHQ2-9): Low Risk  (12/14/2022)  Financial Resource Strain: Low Risk  (12/14/2022)  Physical Activity: Inactive (12/14/2022)  Social Connections: Patient Unable To Answer (01/28/2023)  Stress: Stress Concern Present (12/14/2022)  Tobacco Use: High Risk (01/31/2023)  Health Literacy: Adequate Health Literacy (12/14/2022)    Readmission Risk Interventions    02/10/2023   10:15 AM 01/29/2023   11:31 AM 12/19/2022   10:56 AM  Readmission Risk Prevention Plan  Transportation Screening Complete Complete Complete  Medication Review Oceanographer) Complete Complete Complete  HRI or Home Care Consult Complete Complete Complete  SW Recovery Care/Counseling Consult Complete Complete Complete  Palliative Care Screening Not Applicable Not Applicable Not Applicable   Skilled Nursing Facility Complete Complete Not Applicable

## 2023-02-10 NOTE — Progress Notes (Signed)
Nutrition Follow-up  DOCUMENTATION CODES:   Non-severe (moderate) malnutrition in context of acute illness/injury  INTERVENTION:   Increase Osmolite 1.5 by 10 ml every 8 hours to goal rate of 60 ml/h.  Continue Prosource TF20 60 ml BID.  TF regimen provides 2320 kcal, 130 gm protein, 1097 ml free water daily.  Free water flushes 200 ml q4h for a total of 2297 ml free water daily.  Continue MVI with minerals daily via tube.   If vomiting recurs, may need to convert G-tube into G-J tube.   NUTRITION DIAGNOSIS:   Moderate Malnutrition related to acute illness (acute CVA) as evidenced by mild muscle depletion, mild fat depletion.  Ongoing   GOAL:   Patient will meet greater than or equal to 90% of their needs  Progressing   MONITOR:   TF tolerance, Diet advancement  REASON FOR ASSESSMENT:   Rounds    ASSESSMENT:   68 yo male admitted with acute ischemic CVA. PMH includes HLD, HTN, CAD, CKD, ischemic cardiomyopathy, cocaine abuse, DM-2, CHF. Recent admission for acute HF, discharged to SNF on 01/17/23.  Patient has been tolerating trickle tube feeding with Osmolite 1.5 at 10 ml/h via PEG since yesterday. Per discussion with MD, okay to advance rate slowly.   Labs reviewed.  CBG: 182-219  Medications reviewed and include decadron, ferrous sulfate, folic acid, novolog, reglan, MVI with minerals.  Admission weight: 95.3 kg Current weight: 90.7 kg  Diet Order:   Diet Order     None       EDUCATION NEEDS:   No education needs have been identified at this time  Skin:  Skin Assessment: Skin Integrity Issues: Skin Integrity Issues:: Stage II Stage II: sacrum  Last BM:  1/30  Height:   Ht Readings from Last 1 Encounters:  01/28/23 5\' 9"  (1.753 m)    Weight:   Wt Readings from Last 1 Encounters:  02/10/23 90.7 kg    Ideal Body Weight:  72.7 kg  BMI:  Body mass index is 29.53 kg/m.  Estimated Nutritional Needs:   Kcal:  2200-2400  Protein:   120-140 gm  Fluid:  2.2-2.4 L   Gabriel Rainwater RD, LDN, CNSC Contact Inpatient RD using Secure Chat. If unavailable, use group chat "RD Inpatient" via Secure Chat in [employer].

## 2023-02-10 NOTE — Care Management Important Message (Signed)
Important Message  Patient Details  Name: [name] MRN: [id number] Date of Birth: [birth year]   Important Message Given:  Yes - Medicare IM (reviewed letter with daughter benedict, kue additional copy needed)     Corey Harold 02/10/2023, 1:59 PM

## 2023-02-10 NOTE — Plan of Care (Signed)
Problem: Education: Goal: Knowledge of General Education information will improve Description: Including pain rating scale, medication(s)/side effects and non-pharmacologic comfort measures Outcome: Not Progressing   Problem: Health Behavior/Discharge Planning: Goal: Ability to manage health-related needs will improve Outcome: Not Progressing   Problem: Clinical Measurements: Goal: Ability to maintain clinical measurements within normal limits will improve Outcome: Not Progressing Goal: Will remain free from infection Outcome: Not Progressing Goal: Diagnostic test results will improve Outcome: Not Progressing Goal: Respiratory complications will improve Outcome: Not Progressing Goal: Cardiovascular complication will be avoided Outcome: Not Progressing   Problem: Activity: Goal: Risk for activity intolerance will decrease Outcome: Not Progressing   Problem: Nutrition: Goal: Adequate nutrition will be maintained Outcome: Not Progressing   Problem: Coping: Goal: Level of anxiety will decrease Outcome: Not Progressing   Problem: Elimination: Goal: Will not experience complications related to bowel motility Outcome: Not Progressing Goal: Will not experience complications related to urinary retention Outcome: Not Progressing   Problem: Pain Managment: Goal: General experience of comfort will improve and/or be controlled Outcome: Not Progressing   Problem: Safety: Goal: Ability to remain free from injury will improve Outcome: Not Progressing   Problem: Skin Integrity: Goal: Risk for impaired skin integrity will decrease Outcome: Not Progressing   Problem: Education: Goal: Ability to describe self-care measures that may prevent or decrease complications (Diabetes Survival Skills Education) will improve Outcome: Not Progressing Goal: Individualized Educational Video(s) Outcome: Not Progressing   Problem: Coping: Goal: Ability to adjust to condition or change in  health will improve Outcome: Not Progressing   Problem: Fluid Volume: Goal: Ability to maintain a balanced intake and output will improve Outcome: Not Progressing   Problem: Health Behavior/Discharge Planning: Goal: Ability to identify and utilize available resources and services will improve Outcome: Not Progressing Goal: Ability to manage health-related needs will improve Outcome: Not Progressing   Problem: Metabolic: Goal: Ability to maintain appropriate glucose levels will improve Outcome: Not Progressing   Problem: Nutritional: Goal: Maintenance of adequate nutrition will improve Outcome: Not Progressing Goal: Progress toward achieving an optimal weight will improve Outcome: Not Progressing   Problem: Skin Integrity: Goal: Risk for impaired skin integrity will decrease Outcome: Not Progressing   Problem: Tissue Perfusion: Goal: Adequacy of tissue perfusion will improve Outcome: Not Progressing   Problem: Education: Goal: Knowledge of disease or condition will improve Outcome: Not Progressing Goal: Knowledge of secondary prevention will improve (MUST DOCUMENT ALL) Outcome: Not Progressing Goal: Knowledge of patient specific risk factors will improve (DELETE if not current risk factor) Outcome: Not Progressing   Problem: Ischemic Stroke/TIA Tissue Perfusion: Goal: Complications of ischemic stroke/TIA will be minimized Outcome: Not Progressing   Problem: Coping: Goal: Will verbalize positive feelings about self Outcome: Not Progressing Goal: Will identify appropriate support needs Outcome: Not Progressing   Problem: Health Behavior/Discharge Planning: Goal: Ability to manage health-related needs will improve Outcome: Not Progressing Goal: Goals will be collaboratively established with patient/family Outcome: Not Progressing   Problem: Self-Care: Goal: Ability to participate in self-care as condition permits will improve Outcome: Not Progressing Goal:  Verbalization of feelings and concerns over difficulty with self-care will improve Outcome: Not Progressing Goal: Ability to communicate needs accurately will improve Outcome: Not Progressing   Problem: Nutrition: Goal: Risk of aspiration will decrease Outcome: Not Progressing Goal: Dietary intake will improve Outcome: Not Progressing   Problem: Education: Goal: Knowledge of secondary prevention will improve (MUST DOCUMENT ALL) Outcome: Not Progressing   Problem: Education: Goal: Knowledge of secondary prevention will improve (MUST DOCUMENT  ALL) Outcome: Not Progressing

## 2023-02-10 NOTE — Progress Notes (Signed)
Speech Language Pathology Treatment: Dysphagia  Patient Details Name: [name] MRN: [id number] DOB: November 27, 1955 Today's Date: 02/10/2023 Time: 1030-1100 SLP Time Calculation (min) (ACUTE ONLY): 30 min  Assessment / Plan / Recommendation Clinical Impression  Ongoing diagnostic dysphagia therapy provided today; Pt was somnolent upon SLP arrival but with repositioning to appropriate upright position and talking/providing verbal cues for PO he did respond and open his eyes briefly for trials. Oral care provided followed by ice chips and then cup sips of water. Cup sips of water resulted in labial anterior spillage likely related to mentation/poor awareness of bolus. Despite decreased awareness, Pt did swallow water without coughing or wet vocal quality. Previously when presented spoon Pt has refused by saying "no" or turning head and sealing labial surfaces as to not accept presentations however, today Pt accepted ~10 tsp bites of ice cream which he orally manipulated and swallowing trigger seemed relatively timely upon palpation of swallow. After half the cup of ice cream was given Pt became lethargic again when roused again said "no" and would not/could not keep his eyes open or remain alert enough for additional trials. Note Pt accepting bites of ice cream and swallowing water/thin liquids without coughing is an improvement. Continue to recommend oral care and offering ice chips and small sips of water ONLY WHEN Pt is alert. ST will continue efforts, thank you,    HPI HPI: [name] is a 68 y.o. male who is  [facility] resident with past medical history of strokes, meningioma, hypertension, diabetes, LV thrombus was on Xarelto more than 2 years ago, CKD, CHF, coronary artery disease who was brought in after altered mental status.     Patient is confused and unable to provide any history.  Per chart review, patient was recently admitted from 01/14/2023 to 01/17/2023 for acute on chronic  congestive heart failure as well as AKI on CKD.  He was then seen again in the emergency room on 01/21/2023 for lower abdominal pain, nausea and vomiting.  CT Abdo and pelvis was done which was concerning for enteritis, no bowel obstruction.  He was given Zofran followed by Compazine which finally helped with the symptoms and he was discharged.  He was then brought back on 01/22/2023 for altered mental status, nausea, vomiting and low oxygen saturations.  He was admitted on the medicine team.  MRI brain was obtained and showed acute ischemic stroke.  Therefore neurology was consulted for further recommendations.      SLP Plan  Continue with current plan of care      Recommendations for follow up therapy are one component of a multi-disciplinary discharge planning process, led by the attending physician.  Recommendations may be updated based on patient status, additional functional criteria and insurance authorization.    Recommendations  Diet recommendations: NPO Liquids provided via: Cup;Teaspoon Medication Administration: Via alternative means Supervision: Staff to assist with self feeding;Full supervision/cueing for compensatory strategies Compensations: Small sips/bites Postural Changes and/or Swallow Maneuvers: Seated upright 90 degrees;Upright 30-60 min after meal                  Oral care prior to ice chip/H20;Staff/trained caregiver to provide oral care   Frequent or constant Supervision/Assistance Dysphagia, unspecified (R13.10)     Continue with current plan of care   Brae Schaafsma H. Romie Levee, CCC-SLP Speech Language Pathologist   [name]  02/10/2023, 11:24 AM

## 2023-02-10 NOTE — Progress Notes (Signed)
   02/10/23 1954  Assess: MEWS Score  Temp (!) 97.5 F (36.4 C)  BP 95/65  MAP (mmHg) 75  Pulse Rate 64  Resp 14  Level of Consciousness Responds to Voice  SpO2 100 %  O2 Device Nasal Cannula  O2 Flow Rate (L/min) 2 L/min  Assess: MEWS Score  MEWS Temp 0  MEWS Systolic 1  MEWS Pulse 0  MEWS RR 0  MEWS LOC 1  MEWS Score 2  MEWS Score Color Yellow  Assess: if the MEWS score is Yellow or Red  Were vital signs accurate and taken at a resting state? Yes  Does the patient meet 2 or more of the SIRS criteria? No  Does the patient have a confirmed or suspected source of infection? No  MEWS guidelines implemented  Yes, yellow  Treat  MEWS Interventions Considered administering scheduled or prn medications/treatments as ordered  Take Vital Signs  Increase Vital Sign Frequency  Yellow: Q2hr x1, continue Q4hrs until patient remains green for 12hrs  Escalate  MEWS: Escalate Yellow: Discuss with charge nurse and consider notifying provider and/or RRT  Notify: Charge Nurse/RN  Name of Charge Nurse/RN Notified [place], RN  Assess: SIRS CRITERIA  SIRS Temperature  0  SIRS Respirations  0  SIRS Pulse 0  SIRS WBC 0  SIRS Score Sum  0

## 2023-02-10 NOTE — Plan of Care (Signed)
   Problem: Education: Goal: Knowledge of General Education information will improve Description: Including pain rating scale, medication(s)/side effects and non-pharmacologic comfort measures Outcome: Not Progressing   Problem: Health Behavior/Discharge Planning: Goal: Ability to manage health-related needs will improve Outcome: Not Progressing

## 2023-02-11 LAB — BASIC METABOLIC PANEL
Anion gap: 8 (ref 5–15)
BUN: 66 mg/dL — ABNORMAL HIGH (ref 8–23)
CO2: 27 mmol/L (ref 22–32)
Calcium: 8.8 mg/dL — ABNORMAL LOW (ref 8.9–10.3)
Chloride: 111 mmol/L (ref 98–111)
Creatinine, Ser: 1.31 mg/dL — ABNORMAL HIGH (ref 0.61–1.24)
GFR, Estimated: 59 mL/min — ABNORMAL LOW (ref >60)
Glucose, Bld: 278 mg/dL — ABNORMAL HIGH (ref 70–99)
Potassium: 4.4 mmol/L (ref 3.5–5.1)
Sodium: 146 mmol/L — ABNORMAL HIGH (ref 135–145)

## 2023-02-11 LAB — CBC
HCT: 32.7 % — ABNORMAL LOW (ref 39.0–52.0)
Hemoglobin: 8.4 g/dL — ABNORMAL LOW (ref 13.0–17.0)
MCH: 20.5 pg — ABNORMAL LOW (ref 26.0–34.0)
MCHC: 25.7 g/dL — ABNORMAL LOW (ref 30.0–36.0)
MCV: 80 fL (ref 80.0–100.0)
Platelets: 206 10*3/uL (ref 150–400)
RBC: 4.09 MIL/uL — ABNORMAL LOW (ref 4.22–5.81)
RDW: 23.6 % — ABNORMAL HIGH (ref 11.5–15.5)
WBC: 25.8 10*3/uL — ABNORMAL HIGH (ref 4.0–10.5)
nRBC: 0 % (ref 0.0–0.2)

## 2023-02-11 LAB — GLUCOSE, CAPILLARY
Glucose-Capillary: 302 mg/dL — ABNORMAL HIGH (ref 70–99)
Glucose-Capillary: 304 mg/dL — ABNORMAL HIGH (ref 70–99)
Glucose-Capillary: 214 mg/dL — ABNORMAL HIGH (ref 70–99)
Glucose-Capillary: 172 mg/dL — ABNORMAL HIGH (ref 70–99)

## 2023-02-11 MED ORDER — SODIUM CHLORIDE 0.45 % IV SOLN: INTRAVENOUS | Status: AC

## 2023-02-11 NOTE — Progress Notes (Signed)
PROGRESS NOTE   [name]  ZOX:[id number] DOB: 1955/09/14 DOA: 01/28/2023 PCP: Benetta Spar, MD   Chief Complaint  Patient presents with   Altered Mental Status   Level of care: Med-Surg  Brief Admission History:  [name] is a 68 year old male with chronic combined systolic and diastolic heart failure, hyperlipidemia, type 2 diabetes mellitus, diabetic polyneuropathy, hypertension, polysubstance abuse, stage III CKD history of cocaine abuse, failure to thrive, coronary artery disease, ischemic cardiomyopathy, history of LV mural thrombus, OSA, history of Fournier gangrene, GERD, who was recently hospitalized for acute heart failure and discharged to [facility].   He was sent to the ED by EMS for lethargic and confusion since about 7 PM that evening.  He reportedly has normally been alert and oriented.  He was not answering questions.  Stroke was suspected and he was seen by teleneurologist.  TNK was not given as he was outside the window.  Neurology recommended MRI brain and routine stroke workup.  They recommended aspirin 325 mg daily.   -Awaiting tolerance of tube feeding prior to discharging to SNF rehab   Assessment and Plan: 1)Acute ischemic CVA with Dysphagia - MRI brain positive for small acute infarct in the left insular white matter -Neurology consult appreciated -A1c 7.2, LDL 29 -TTE with bubble study without evidence of LV thrombus, EF is 25 to 30% with wall motion abnormalities, Agitated saline contrast bubble study was negative, with no evidence of any interatrial shunt , no mitral stenosis, no aortic stenosis - c/n ASA 325mg  daily and atorvastatin for secondary prevention. -Awaiting tolerance of tube feeding prior to discharging to SNF rehab  2)Meningioma in the Right Middle Cranial Fossa with Edema in the adjacent Temporal Lobe-- - Given his persistent encephalopathy, neurosurgeon Dr. Danielle Dess on 01/30/23 - Recommend trial of dexamethasone to  see if patient improves.   -He recommended 4 mg BID.  If patient improves on this, he said to discharge patient on oral decadron and follow up with him in the office to discuss surgical removal of the meningioma. 02/11/23 -Mentation -improving  -I reached out to Dr. Barnett Abu (Neurosurgeon) --[phone]-- --he recommends tapering down Decadron from 4 mg twice daily to 2 mg twice daily for 1 week (until 02/12/23); then 1 mg twice daily for 1 week (starting on 02/13/23); then 1 mg daily for 1 week (starting 02/21/23) then stop.  3)Acute Encephalopathy -due to #1 #2 and # 4 above -Neurology Consult appreciated - EEG no epileptic activity seen; c/w encephalopathy -Neurology, palliative care and neurosurgery input appreciated --please see above 02/11/23 -Serum ammonia is not elevated -ABG without elevated CO2 -Continues to have episodes of disorientation and restlessness at times  4)HyperNatremia/Hyperchloremia--- pt has free water deficits due to Stroke Related Dysphagia -Sodium and chloride improving with free water flushes via PEG and IV fluids --IV fluids x 24 hours-then recheck BMP  5)Dysphagia with Swallowing Difficulties/Nutrition--- due to #1 #2 above -Speech therapy appreciated -Status post PEG tube placement on 02/02/2023 02/11/23 -Patient did well on IV Reglan--was on the routine tube feeding without emesis Dose of IV Reglan was around noon on 02/06/2023--without IV regular patient on 02/07/2023 started to have episode of emesis again -iv Reglan restarted on 02/07/2023 for GI prokinetic effects--tube feeding has been resumed and following discussions with patient's healthcare power of attorney GI service has been consulted. -Abdominal x-ray not demonstrating any obstruction and tube in adequate place. -Dietitian consult for tube feeding appreciated; continue to follow recommendations. --Speech pathologist consult requested--to  see if patient can have trial of oral intake. -After discussing  with dietitian to feedings has been changed from Jevity to Osmolite to assess for tolerance. -Speech pathologist recommending NPO still after trial session on 02/10/23. -No further emesis today on 02/11/2023 -- Continue to titrate up tube feeding to goal if no further emesis  6)AKI----acute kidney injury on CKD stage -3B  - Creatinine improving with increased water flushes and tube feeding---hydration - renally adjust medications, avoid nephrotoxic agents / dehydration  / hypotension   7)Acute respiratory failure with hypoxia due to Aspiration pneumonia - treated with  IV ampicillin sulbactam , okay to complete Augmentin via PEG -Currently requiring 2 L of oxygen via nasal cannula -Continue bronchodilators and mucolytics   8)DM2 A1c 7.2 reflecting uncontrolled DM with hyperglycemia PTA Use Novolog/Humalog Sliding scale insulin with Accu-Cheks/Fingersticks as ordered   9)History of polysubstance abuse including cocaine in the past - he is currently residing in a SNF - UDS negative    10)Normocytic Anemia in the setting of underlying CKD 3B - -Hgb currently greater than 8 -No bleeding concerns monitor closely and transfuse as clinically indicated  11) leukocytosis -No frank history source or signs of infection currently -Most likely associated with steroid use -Patient is afebrile    12)Social/ethics--- palliative care consult appreciated patient is a DNR/DNI Goals of care Discussion: We discussed goals of care for [name] .   -- Patient with acute infarct in the left insular white matter (MRI Brain 01/28/23) -Known meningioma in the right middle cranial fossa with edema in the adjacent temporal lobe (MRI Brain 01/28/23) -Stroke related dysphagia--status post PEG tube placement on 02/02/23 -Poor tolerance of tube feeding with high residuals -Persistent hypernatremia due to free water deficit in the setting of lack of oral intake -- Discussed with patient's daughter that overall  prognosis is poor and very low purposeful recovery rate anticipated. -- [name] verbalizes understanding of overall poor prognosis. -She requested will continue current level of care -Patient remains DNR/DNI -Patient daughter will consider de-escalation to hospice if patient does Not improve over the next several days. -If patient improves over the next several days he may be able to go to rehab.  13)PAD/CAD--status post prior angioplasty and stent placement -Continue aspirin, atorvastatin and metoprolol  14)HFrEF--chronic systolic dysfunction CHF---??  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 The entire inferior wall, apical lateral segment,  apical septal segment, and apical anterior segment are akinetic. The  anterior wall, antero-lateral wall, anterior septum, mid inferoseptal segment, and  basal inferoseptal segment are hypokinetic.  -c/n Metoprolol  avoid ACEI/ARB/ARNI and  aldactone due to renal concerns Ok to use Hydralazine/Isosordil combo in lieu of ACEI/ARB -Home torsemide on hold due to AKI and dehydration concerns  15)Pressure injury to sacrum stage 2, present on admission  -Nursing care as outlined -Continue constant repositioning and preventive measures.  16) moderate protein calorie malnutrition -Secondary to residual dysphagia from stroke -continue tube feedings.   Code status:   Code Status: Limited: Do not attempt resuscitation (DNR) -DNR-LIMITED -Do Not Intubate/DNI     DVT prophylaxis: sq heparin  Code Status: DNR/DNI Family Communication: daughter is primary contact Disposition: anticipate SNF rehab (-Holding discharge to SNF until tolerating tube feeding well; rate  currently advancing  after switching him to osmolite )   Consultants:  Neurology  Palliative care General Surgery for PEG tube placement on 02/02/2023  Procedures:  PEG tube 01/30/2023   Subjective:  -Patient found laying in the prone position with the bed flat without any elevation of the head of bed with tube feeding running - Charge RN Brandie and a tech were able to reposition patient and elevate head of bed  No further emesis today  Objective: Vitals:   02/10/23 2332 02/11/23 0344 02/11/23 0921 02/11/23 1424  BP: 118/64 104/66 115/67 107/68  Pulse: 67 73 76 91  Resp: 18 16    Temp: 97.9 F (36.6 C) 97.8 F (36.6 C)    TempSrc: Oral Axillary    SpO2: 100% 99%  90%  Weight:  90.2 kg    Height:        Intake/Output Summary (Last 24 hours) at 02/11/2023 1907 Last data filed at 02/11/2023 1700 Gross per 24 hour  Intake 310.78 ml  Output 1900 ml  Net -1589.22 ml   Filed Weights   02/09/23 0537 02/10/23 0526 02/11/23 0344  Weight: 89.5 kg 90.7 kg 90.2 kg   Physical Exam Gen:- Awake, in no acute distress  HEENT:- [place].AT, No sclera icterus Nose- [place] 2L/min Neck-Supple Neck,No JVD,.  Lungs-no wheezing,, fair air movement bilaterally  CV- S1, S2 normal, RRR Abd-  +ve B.Sounds, Abd Soft, No tenderness, PEG tube in situ Extremity/Skin:- +ve  edema,   good pedal pulses , tenderness on palpation of feet and ankles, no obvious erythema no significant warmth or streaking Psych-more awake, occasional confusional episodes with disorientation persist  Neuro-improving left-sided hemiparesis , speech and swallowing difficulties persi Skin: Stage II sacral pressure injury without signs of infection. Present on admission. No petechiae    Data Reviewed: I have personally reviewed following labs and imaging studies  CBC: Recent Labs  Lab 02/08/23 0425 02/11/23 0439  WBC 25.8* 25.8*  HGB 9.6* 8.4*  HCT 35.8* 32.7*  MCV 77.5* 80.0  PLT 269 206    Basic Metabolic Panel: Recent Labs  Lab  02/05/23 0548 02/06/23 0444 02/07/23 0407 02/08/23 0425 02/11/23 0439  NA 155* 145 145 149* 146*  K 4.4 4.3 4.4 3.9 4.4  CL 120* 113* 112* 112* 111  CO2 26 24 24 29 27   GLUCOSE 245* 306* 296* 176* 278*  BUN 76* 76* 73* 57* 66*  CREATININE 1.83* 1.62* 1.49* 1.25* 1.31*  CALCIUM 8.8* 8.5* 8.7* 8.7* 8.8*  PHOS  --   --   --  2.7  --    CBG: Recent Labs  Lab 02/10/23 1655 02/10/23 2148 02/11/23 0712 02/11/23 1105 02/11/23 1639  GLUCAP 218* 233* 302* 304* 214*   Scheduled Meds:  aspirin  325 mg Per Tube Daily   atorvastatin  40 mg Per Tube QHS   dexamethasone (DECADRON) injection  2 mg Intravenous Q12H   feeding supplement (PROSource TF20)  60 mL Per Tube BID   ferrous sulfate  300 mg Per Tube Daily   folic acid  1 mg Per Tube Daily   free water  200 mL Per Tube Q4H   gabapentin  100 mg Per Tube QHS   heparin  5,000 Units Subcutaneous Q8H   hydrALAZINE  25 mg Per Tube BID   insulin aspart  0-10 Units Subcutaneous QHS   insulin aspart  0-20 Units Subcutaneous TID WC   isosorbide dinitrate  5 mg Per Tube BID   linaclotide  290 mcg  Per Tube QAC breakfast   metoCLOPramide (REGLAN) injection  10 mg Intravenous Q6H   metoprolol tartrate  25 mg Per Tube BID   multivitamin with minerals  1 tablet Per Tube Daily   mouth rinse  15 mL Mouth Rinse 4 times per day   Continuous Infusions:  sodium chloride 41 mL/hr at 02/11/23 0920   feeding supplement (OSMOLITE 1.5 CAL) 30 mL/hr at 02/11/23 0034    LOS: 14 days    Shon Hale, MD  02/11/2023, 7:07 PM

## 2023-02-11 NOTE — Progress Notes (Signed)
Feed rate changed to 30 mL/ h. No more emesis episodes have occurred since beginning of shift. Will continue to monitor how patient tolerates rate change.

## 2023-02-12 DIAGNOSIS — I69391 Dysphagia following cerebral infarction: Secondary | ICD-10-CM | POA: Diagnosis not present

## 2023-02-12 DIAGNOSIS — Z515 Encounter for palliative care: Secondary | ICD-10-CM | POA: Diagnosis not present

## 2023-02-12 DIAGNOSIS — Z7189 Other specified counseling: Secondary | ICD-10-CM | POA: Diagnosis not present

## 2023-02-12 DIAGNOSIS — I639 Cerebral infarction, unspecified: Secondary | ICD-10-CM | POA: Diagnosis not present

## 2023-02-12 LAB — GLUCOSE, CAPILLARY
Glucose-Capillary: 354 mg/dL — ABNORMAL HIGH (ref 70–99)
Glucose-Capillary: 320 mg/dL — ABNORMAL HIGH (ref 70–99)
Glucose-Capillary: 282 mg/dL — ABNORMAL HIGH (ref 70–99)

## 2023-02-12 LAB — RENAL FUNCTION PANEL
Albumin: 2.7 g/dL — ABNORMAL LOW (ref 3.5–5.0)
Anion gap: 9 (ref 5–15)
BUN: 59 mg/dL — ABNORMAL HIGH (ref 8–23)
CO2: 23 mmol/L (ref 22–32)
Calcium: 9.1 mg/dL (ref 8.9–10.3)
Chloride: 111 mmol/L (ref 98–111)
Creatinine, Ser: 1.21 mg/dL (ref 0.61–1.24)
GFR, Estimated: >60 mL/min (ref >60)
Glucose, Bld: 359 mg/dL — ABNORMAL HIGH (ref 70–99)
Phosphorus: 3.1 mg/dL (ref 2.5–4.6)
Potassium: 4.9 mmol/L (ref 3.5–5.1)
Sodium: 143 mmol/L (ref 135–145)

## 2023-02-12 MED ORDER — SODIUM CHLORIDE 0.45 % IV SOLN: INTRAVENOUS | Status: AC

## 2023-02-12 MED ORDER — ASPIRIN 325 MG PO TABS
325.0000 mg | ORAL_TABLET | Freq: Every day | ORAL | Status: DC
  Administered 2023-02-13 – 2023-02-14 (×2): 325 mg
  Filled 2023-02-12 (×3): qty 1

## 2023-02-12 MED ORDER — LORAZEPAM 2 MG/ML IJ SOLN
1.0000 mg | Freq: Four times a day (QID) | INTRAMUSCULAR | Status: DC | PRN
  Administered 2023-02-12 – 2023-02-14 (×6): 1 mg via INTRAVENOUS
  Filled 2023-02-12 (×6): qty 1

## 2023-02-12 MED ORDER — INSULIN GLARGINE-YFGN 100 UNIT/ML ~~LOC~~ SOLN
12.0000 [IU] | Freq: Every day | SUBCUTANEOUS | Status: DC
  Administered 2023-02-12 – 2023-02-13 (×2): 12 [IU] via SUBCUTANEOUS
  Filled 2023-02-12 (×3): qty 0.12

## 2023-02-12 NOTE — Plan of Care (Signed)
This pt has AMS, could not verbalize or express understanding of Stroke questions. Pt exp an eventful night. Restless, agitated. Attempted pulling out newly place G-tube (tube). Gave pt Ativan and Dilaudid - non successful with curbing pts behavior. Periodically sat in pts room to assist in soothing and comforting pt to ease his his confused behaviors. Pt finally has succumbed to sleep, resting comfortably. Rise and fall of chest visualized. Will monitor for the remainder of nursing shift for changes or signs of discomfort.

## 2023-02-12 NOTE — Progress Notes (Signed)
PROGRESS NOTE   [name]  UJW:[id number] DOB: [birth year] DOA: 01/28/2023 PCP: Benetta Spar, MD   Chief Complaint  Patient presents with   Altered Mental Status   Level of care: Med-Surg  Brief Admission History:  [name] is a 68 year old male with chronic combined systolic and diastolic heart failure, hyperlipidemia, type 2 diabetes mellitus, diabetic polyneuropathy, hypertension, polysubstance abuse, stage III CKD history of cocaine abuse, failure to thrive, coronary artery disease, ischemic cardiomyopathy, history of LV mural thrombus, OSA, history of Fournier gangrene, GERD, who was recently hospitalized for acute heart failure and discharged to [facility].   He was sent to the ED by EMS for lethargic and confusion since about 7 PM that evening.  He reportedly has normally been alert and oriented.  He was not answering questions.  Stroke was suspected and he was seen by teleneurologist.  TNK was not given as he was outside the window.  Neurology recommended MRI brain and routine stroke workup.  They recommended aspirin 325 mg daily.   -Awaiting tolerance of tube feeding prior to discharging to SNF rehab   Assessment and Plan: 1)Acute ischemic CVA with Dysphagia - MRI brain positive for small acute infarct in the left insular white matter -Neurology consult appreciated -A1c 7.2, LDL 29 -TTE with bubble study without evidence of LV thrombus, EF is 25 to 30% with wall motion abnormalities, Agitated saline contrast bubble study was negative, with no evidence of any interatrial shunt , no mitral stenosis, no aortic stenosis - c/n ASA 325mg  daily and atorvastatin for secondary prevention. -Awaiting tolerance of tube feeding prior to discharging to SNF rehab  2)Meningioma in the Right Middle Cranial Fossa with Edema in the adjacent Temporal Lobe-- - Given his persistent encephalopathy, neurosurgeon Dr. Danielle Dess on 01/30/23 - Recommend trial of dexamethasone to  see if patient improves.   -He recommended 4 mg BID.  If patient improves on this, he said to discharge patient on oral decadron and follow up with him in the office to discuss surgical removal of the meningioma. 02/11/23 -Mentation -improving  -I reached out to Dr. Barnett Abu (Neurosurgeon) --[phone]-- --he recommends tapering down Decadron from 4 mg twice daily to 2 mg twice daily for 1 week (until 02/12/23); then 1 mg twice daily for 1 week (starting on 02/13/23); then 1 mg daily for 1 week (starting 02/21/23) then stop.  3)Acute Encephalopathy -due to #1 #2 and # 4 above -Neurology Consult appreciated - EEG no epileptic activity seen; c/w encephalopathy -Neurology, palliative care and neurosurgery input appreciated --please see above 02/11/23 -Serum ammonia is not elevated -ABG without elevated CO2 -Continues to have episodes of disorientation and restlessness at times  4)HyperNatremia/Hyperchloremia--- pt has free water deficits due to Stroke Related Dysphagia -Sodium and chloride improving with free water flushes via PEG and IV fluids -- Patient's sodium and renal function improved; continue to follow trend. -Maintain adequate hydration.  5)Dysphagia with Swallowing Difficulties/Nutrition--- due to #1 #2 above -Speech therapy appreciated -Status post PEG tube placement on 02/02/2023 02/11/23 -Patient did well on IV Reglan--was on the routine tube feeding without emesis Dose of IV Reglan was around noon on 02/06/2023--without IV regular patient on 02/07/2023 started to have episode of emesis again -iv Reglan restarted on 02/07/2023 for GI prokinetic effects--tube feeding has been resumed and following discussions with patient's healthcare power of attorney GI service has been consulted. -Abdominal x-ray not demonstrating any obstruction and tube in adequate place. -Dietitian consult for tube feeding appreciated; continue  to follow recommendations. --Speech pathologist consult requested--to  see if patient can have trial of oral intake. -After discussing with dietitian to feedings has been changed from Jevity to Osmolite to assess for tolerance. -Speech pathologist recommending NPO still after trial session on 02/10/23. -No further emesis today on 02/11/2023 -- Continue to titrate up tube feeding to goal if no further emesis  6)AKI----acute kidney injury on CKD stage -3B  - Creatinine improving with increased water flushes and tube feeding---hydration - renally adjust medications, avoid nephrotoxic agents / dehydration  / hypotension   7)Acute respiratory failure with hypoxia due to Aspiration pneumonia -Patient has completed antibiotic therapy. -Currently requiring 2 L of oxygen via nasal cannula -Continue bronchodilators and mucolytics. -Continue supportive care.   8)DM2 A1c 7.2 reflecting uncontrolled DM with hyperglycemia PTA Use Novolog/Humalog Sliding scale insulin with Accu-Cheks/Fingersticks as ordered   9)History of polysubstance abuse including cocaine in the past - he is currently residing in a SNF - UDS negative    10)Normocytic Anemia in the setting of underlying CKD 3B - -Hgb currently greater than 8 -No bleeding concerns monitor closely and transfuse as clinically indicated  11) leukocytosis -No frank history source or signs of infection currently -Most likely associated with steroid use -Patient is afebrile    12)Social/ethics--- palliative care consult appreciated patient is a DNR/DNI Goals of care Discussion: We discussed goals of care for [name] .   -- Patient with acute infarct in the left insular white matter (MRI Brain 01/28/23) -Known meningioma in the right middle cranial fossa with edema in the adjacent temporal lobe (MRI Brain 01/28/23) -Stroke related dysphagia--status post PEG tube placement on 02/02/23 -Poor tolerance of tube feeding with high residuals -Persistent hypernatremia due to free water deficit in the setting of lack of  oral intake -- Discussed with patient's daughter that overall prognosis is poor and very low purposeful recovery rate anticipated. -- [name] verbalizes understanding of overall poor prognosis. -She requested will continue current level of care -Patient remains DNR/DNI -Patient daughter will consider de-escalation to hospice if patient does Not improve over the next several days. -If patient improves over the next several days he may be able to go to rehab.  13)PAD/CAD--status post prior angioplasty and stent placement -Continue aspirin, atorvastatin and metoprolol  14)HFrEF--chronic systolic dysfunction CHF---??  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 The entire inferior wall, apical lateral segment,  apical septal segment, and apical anterior segment are akinetic. The  anterior wall, antero-lateral wall, anterior septum, mid inferoseptal segment, and  basal inferoseptal segment are hypokinetic.  -c/n Metoprolol  avoid ACEI/ARB/ARNI and  aldactone due to renal concerns Ok to use Hydralazine/Isosordil combo in lieu of ACEI/ARB -Home torsemide on hold due to AKI and dehydration concerns  15)Pressure injury to sacrum stage 2, present on admission  -Nursing care as outlined -Continue constant repositioning and preventive measures.  16) moderate protein calorie malnutrition -Secondary to residual dysphagia from stroke -continue tube feedings.  17) hyperglycemia -In the setting of Decadron usage and tube feedings -Continue sliding scale insulin and restart Semglee. -Follow CBG fluctuation.   Code status:   Code Status: Limited: Do not attempt resuscitation (DNR) -DNR-LIMITED -Do Not Intubate/DNI     DVT  prophylaxis: sq heparin  Code Status: DNR/DNI Family Communication: daughter is primary contact Disposition: anticipate SNF rehab (-Holding discharge to SNF until tolerating tube feeding well; rate currently advancing after switching him to osmolite )   Consultants:  Neurology  Palliative care General Surgery for PEG tube placement on 02/02/2023  Procedures:  PEG tube 01/30/2023   Subjective: Afebrile, no chest pain, no nausea or vomiting; so far tolerating tube feedings up to 30 mL/h.  Overall renal function and electrolytes stabilizing.  Still experiencing elevated blood sugar most likely associated with tube feedings.  No new focal neurologic deficits.  Objective: Vitals:   02/12/23 0456 02/12/23 0645 02/12/23 0827 02/12/23 1331  BP:  110/74 117/72 119/86  Pulse:   79 80  Resp:    20  Temp:    98.7 F (37.1 C)  TempSrc:    Axillary  SpO2:    100%  Weight: 90.2 kg     Height:        Intake/Output Summary (Last 24 hours) at 02/12/2023 1538 Last data filed at 02/12/2023 1332 Gross per 24 hour  Intake 310.78 ml  Output 1750 ml  Net -1439.22 ml   Filed Weights   02/10/23 0526 02/11/23 0344 02/12/23 0456  Weight: 90.7 kg 90.2 kg 90.2 kg   Physical Exam General exam: Alert, following commands and demonstrating dysarthria and left hemiparesis (unchanged).  Per nursing staff intermittent restlessness and agitation throughout the night. Respiratory system: No wheezing or crackles appreciated on exam; no using accessory muscle.  Good saturation on 2 L nasal cannula supplementation. Cardiovascular system: Rate controlled, no rubs, no gallops, no JVD. Gastrointestinal system: Abdomen is nondistended, soft and nontender.  PEG tube in situ; positive bowel sounds appreciated. Central nervous system: No new focal neurological deficits.  Difficulty swallowing and a speech abnormality persist.  Positive hyphema paresis appreciated on exam.  Generally weak. Extremities: No cyanosis or  clubbing. Skin: No petechiae; stage II sacral present at time of admission without signs of superimposed infection. Psychiatry: Flat affect appreciated on exam.    Data Reviewed: I have personally reviewed following labs and imaging studies  CBC: Recent Labs  Lab 02/08/23 0425 02/11/23 0439  WBC 25.8* 25.8*  HGB 9.6* 8.4*  HCT 35.8* 32.7*  MCV 77.5* 80.0  PLT 269 206    Basic Metabolic Panel: Recent Labs  Lab 02/06/23 0444 02/07/23 0407 02/08/23 0425 02/11/23 0439 02/12/23 1023  NA 145 145 149* 146* 143  K 4.3 4.4 3.9 4.4 4.9  CL 113* 112* 112* 111 111  CO2 24 24 29 27 23   GLUCOSE 306* 296* 176* 278* 359*  BUN 76* 73* 57* 66* 59*  CREATININE 1.62* 1.49* 1.25* 1.31* 1.21  CALCIUM 8.5* 8.7* 8.7* 8.8* 9.1  PHOS  --   --  2.7  --  3.1   CBG: Recent Labs  Lab 02/11/23 0712 02/11/23 1105 02/11/23 1639 02/11/23 1953 02/12/23 1135  GLUCAP 302* 304* 214* 172* 354*   Scheduled Meds:  [START ON 02/13/2023] aspirin  325 mg Per Tube Daily   atorvastatin  40 mg Per Tube QHS   dexamethasone (DECADRON) injection  2 mg Intravenous Q12H   feeding supplement (PROSource TF20)  60 mL Per Tube BID   ferrous sulfate  300 mg Per Tube Daily   folic acid  1 mg Per Tube Daily   free water  200 mL Per Tube Q4H   gabapentin  100 mg Per Tube QHS   heparin  5,000 Units Subcutaneous Q8H   hydrALAZINE  25 mg Per Tube BID   insulin aspart  0-10 Units Subcutaneous QHS   insulin aspart  0-20 Units Subcutaneous TID WC   insulin glargine-yfgn  12 Units Subcutaneous Daily   isosorbide dinitrate  5 mg Per Tube BID   linaclotide  290 mcg Per Tube QAC breakfast   metoCLOPramide (REGLAN) injection  10 mg Intravenous Q6H   metoprolol tartrate  25 mg Per Tube BID   multivitamin with minerals  1 tablet Per Tube Daily   mouth rinse  15 mL Mouth Rinse 4 times per day   Continuous Infusions:  sodium chloride     feeding supplement (OSMOLITE 1.5 CAL) 30 mL/hr at 02/11/23 0034    LOS: 15 days     Vassie Loll, MD  02/12/2023, 3:38 PM

## 2023-02-12 NOTE — Progress Notes (Signed)
Unable to get morning CBG pt biting at the tech and fighting. Tried to get  finger stick three different times. MD made aware.

## 2023-02-12 NOTE — Plan of Care (Signed)
  Problem: Clinical Measurements: Goal: Ability to maintain clinical measurements within normal limits will improve Outcome: Progressing Goal: Will remain free from infection Outcome: Progressing Goal: Diagnostic test results will improve Outcome: Progressing Goal: Respiratory complications will improve Outcome: Progressing   Problem: Pain Managment: Goal: General experience of comfort will improve and/or be controlled Outcome: Progressing

## 2023-02-13 DIAGNOSIS — I639 Cerebral infarction, unspecified: Secondary | ICD-10-CM | POA: Diagnosis not present

## 2023-02-13 DIAGNOSIS — I69391 Dysphagia following cerebral infarction: Secondary | ICD-10-CM | POA: Diagnosis not present

## 2023-02-13 DIAGNOSIS — Z515 Encounter for palliative care: Secondary | ICD-10-CM | POA: Diagnosis not present

## 2023-02-13 DIAGNOSIS — Z7189 Other specified counseling: Secondary | ICD-10-CM | POA: Diagnosis not present

## 2023-02-13 LAB — GLUCOSE, CAPILLARY
Glucose-Capillary: 344 mg/dL — ABNORMAL HIGH (ref 70–99)
Glucose-Capillary: 440 mg/dL — ABNORMAL HIGH (ref 70–99)
Glucose-Capillary: 438 mg/dL — ABNORMAL HIGH (ref 70–99)
Glucose-Capillary: 288 mg/dL — ABNORMAL HIGH (ref 70–99)

## 2023-02-13 MED ORDER — INSULIN GLARGINE-YFGN 100 UNIT/ML ~~LOC~~ SOLN
12.0000 [IU] | Freq: Two times a day (BID) | SUBCUTANEOUS | Status: DC
  Administered 2023-02-13 – 2023-02-14 (×3): 12 [IU] via SUBCUTANEOUS
  Filled 2023-02-13 (×4): qty 0.12

## 2023-02-13 MED ORDER — INSULIN ASPART 100 UNIT/ML IJ SOLN
3.0000 [IU] | Freq: Three times a day (TID) | INTRAMUSCULAR | Status: DC
  Administered 2023-02-13 – 2023-02-14 (×4): 3 [IU] via SUBCUTANEOUS

## 2023-02-13 MED ORDER — DEXAMETHASONE SODIUM PHOSPHATE 4 MG/ML IJ SOLN
1.0000 mg | Freq: Two times a day (BID) | INTRAMUSCULAR | Status: DC
  Administered 2023-02-13 – 2023-02-14 (×3): 1 mg via INTRAVENOUS
  Filled 2023-02-13 (×3): qty 1

## 2023-02-13 NOTE — Progress Notes (Signed)
PROGRESS NOTE   [name]  GNF:[id number] DOB: Oct 10, 1955 DOA: 01/28/2023 PCP: Benetta Spar, MD   Chief Complaint  Patient presents with   Altered Mental Status   Level of care: Med-Surg  Brief Admission History:  [name] is a 68 year old male with chronic combined systolic and diastolic heart failure, hyperlipidemia, type 2 diabetes mellitus, diabetic polyneuropathy, hypertension, polysubstance abuse, stage III CKD history of cocaine abuse, failure to thrive, coronary artery disease, ischemic cardiomyopathy, history of LV mural thrombus, OSA, history of Fournier gangrene, GERD, who was recently hospitalized for acute heart failure and discharged to [facility].   He was sent to the ED by EMS for lethargic and confusion since about 7 PM that evening.  He reportedly has normally been alert and oriented.  He was not answering questions.  Stroke was suspected and he was seen by teleneurologist.  TNK was not given as he was outside the window.  Neurology recommended MRI brain and routine stroke workup.  They recommended aspirin 325 mg daily.   -Awaiting tolerance of tube feeding prior to discharging to SNF rehab   Assessment and Plan: 1)Acute ischemic CVA with Dysphagia - MRI brain positive for small acute infarct in the left insular white matter -Neurology consult appreciated -A1c 7.2, LDL 29 -TTE with bubble study without evidence of LV thrombus, EF is 25 to 30% with wall motion abnormalities, Agitated saline contrast bubble study was negative, with no evidence of any interatrial shunt , no mitral stenosis, no aortic stenosis - c/n ASA 325mg  daily and atorvastatin for secondary prevention. -Awaiting tolerance of tube feeding prior to discharging to SNF rehab  2)Meningioma in the Right Middle Cranial Fossa with Edema in the adjacent Temporal Lobe-- - Given his persistent encephalopathy, neurosurgeon Dr. Danielle Dess on 01/30/23 - Recommend trial of dexamethasone to  see if patient improves.   -He recommended 4 mg BID.  If patient improves on this, he said to discharge patient on oral decadron and follow up with him in the office to discuss surgical removal of the meningioma. 02/11/23 -Mentation -improving  -I reached out to Dr. Barnett Abu (Neurosurgeon) --[phone]-- --he recommends tapering down Decadron from 4 mg twice daily to 2 mg twice daily for 1 week (until 02/12/23); then 1 mg twice daily for 1 week (startet on 02/13/23); then 1 mg daily for 1 week (starting 02/21/23) then stop.  3)Acute Encephalopathy -due to #1 #2 and # 4 above -Neurology Consult appreciated - EEG no epileptic activity seen; c/w encephalopathy -Neurology, palliative care and neurosurgery input appreciated --please see above 02/11/23 -Serum ammonia is not elevated -ABG without elevated CO2 -Continues to have episodes of disorientation and restlessness at times  4)HyperNatremia/Hyperchloremia--- pt has free water deficits due to Stroke Related Dysphagia -Sodium and chloride improving with free water flushes via PEG and IV fluids -- Patient's sodium and renal function improved; continue to follow trend. -Maintain adequate hydration. -Patient's sodium level has stabilized and within normal limits -Continue free water along with tube feedings.  5)Dysphagia with Swallowing Difficulties/Nutrition--- due to #1 #2 above -Speech therapy appreciated -Status post PEG tube placement on 02/02/2023 02/11/23 -Patient did well on IV Reglan--was on the routine tube feeding without emesis Dose of IV Reglan was around noon on 02/06/2023--without IV regular patient on 02/07/2023 started to have episode of emesis again -iv Reglan restarted on 02/07/2023 for GI prokinetic effects--tube feeding has been resumed and following discussions with patient's healthcare power of attorney GI service has been consulted. -Abdominal x-ray  not demonstrating any obstruction and tube in adequate place. -Dietitian  consult for tube feeding appreciated; continue to follow recommendations. --Speech pathologist consult requested--to see if patient can have trial of oral intake. -After discussing with dietitian to feedings has been changed from Jevity to Osmolite to assess for tolerance. -Speech pathologist recommending NPO still after trial session on 02/10/23. -No further emesis today on 02/11/2023 -- Continue to titrate up tube feeding to goal if no further emesis  6)AKI----acute kidney injury on CKD stage -3B  - Creatinine improving with increased water flushes and tube feeding---hydration - renally adjust medications, avoid nephrotoxic agents / dehydration  / hypotension   7)Acute respiratory failure with hypoxia due to Aspiration pneumonia -Patient has completed antibiotic therapy. -Currently requiring 2 L of oxygen via nasal cannula -Continue bronchodilators and mucolytics. -Continue supportive care.   8)DM2 A1c 7.2 reflecting uncontrolled DM with hyperglycemia PTA Use Novolog/Humalog Sliding scale insulin with Accu-Cheks/Fingersticks as ordered  -Continue adjusted dose of insulin therapy using Semglee twice a day and NovoLog coverage given ongoing tube feedings and the use of his steroids.  9)History of polysubstance abuse including cocaine in the past - he is currently residing in a SNF - UDS negative    10)Normocytic Anemia in the setting of underlying CKD 3B - -Hgb currently greater than 8 -No bleeding concerns monitor closely and transfuse as clinically indicated  11) leukocytosis -No frank history source or signs of infection currently -Most likely associated with steroid use -Patient is afebrile    12)Social/ethics--- palliative care consult appreciated patient is a DNR/DNI Goals of care Discussion: We discussed goals of care for [name] .   -- Patient with acute infarct in the left insular white matter (MRI Brain 01/28/23) -Known meningioma in the right middle cranial fossa  with edema in the adjacent temporal lobe (MRI Brain 01/28/23) -Stroke related dysphagia--status post PEG tube placement on 02/02/23 -Poor tolerance of tube feeding with high residuals -Persistent hypernatremia due to free water deficit in the setting of lack of oral intake -- Discussed with patient's daughter that overall prognosis is poor and very low purposeful recovery rate anticipated. -- [name] verbalizes understanding of overall poor prognosis. -She requested will continue current level of care -Patient remains DNR/DNI -Patient daughter will consider de-escalation to hospice if patient does Not improve over the next several days. -Patient for the most part with stable vital signs and now tolerating tube feedings; patient's daughter was requesting for patient to be transferred to [facility] in [place] for second opinion and further care; expressed not wanting patient to be discharged to [facility].  Unfortunately patient has not been accepted.  13)PAD/CAD--status post prior angioplasty and stent placement -Continue aspirin, atorvastatin and metoprolol  14)HFrEF--chronic systolic dysfunction CHF---??  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 The entire inferior wall, apical lateral segment,  apical septal segment, and apical anterior segment are akinetic. The  anterior wall, antero-lateral wall, anterior septum, mid inferoseptal segment, and  basal inferoseptal segment are hypokinetic.  -c/n Metoprolol  avoid ACEI/ARB/ARNI and  aldactone due to renal concerns Ok to use Hydralazine/Isosordil combo in lieu of ACEI/ARB -Home torsemide on hold due to AKI and dehydration concerns  15)Pressure injury to sacrum  stage 2, present  on admission  -Nursing care as outlined -Continue constant repositioning and preventive measures.  16) moderate protein calorie malnutrition -Secondary to residual dysphagia from stroke -continue tube feedings.  17) hyperglycemia -In the setting of Decadron usage and tube feedings -Continue sliding scale insulin and restart Semglee. -Follow CBG fluctuation.   Code status:   Code Status: Limited: Do not attempt resuscitation (DNR) -DNR-LIMITED -Do Not Intubate/DNI     DVT prophylaxis: sq heparin  Code Status: DNR/DNI Family Communication: daughter is primary contact Disposition: anticipate SNF rehab (-Holding discharge to SNF until tolerating tube feeding well; rate currently advancing after switching him to osmolite )   Consultants:  Neurology  Palliative care General Surgery for PEG tube placement on 02/02/2023  Procedures:  PEG tube 01/30/2023   Subjective: Dysarthria and left hemiparesis remains appreciated on exam.  No further nausea or vomiting.  Patient is afebrile with good oxygen saturation on 2 L supplementation.  So far tolerating tube feedings at 60 mL/h.  Objective: Vitals:   02/12/23 1331 02/12/23 2018 02/13/23 0604 02/13/23 1358  BP: 119/86 110/70 116/75 115/72  Pulse: 80 75 81 80  Resp: 20 20 18 18   Temp: 98.7 F (37.1 C) 97.7 F (36.5 C) (!) 97.2 F (36.2 C)   TempSrc: Axillary Oral    SpO2: 100% 98% 100% 100%  Weight:   89 kg   Height:        Intake/Output Summary (Last 24 hours) at 02/13/2023 1923 Last data filed at 02/13/2023 1634 Gross per 24 hour  Intake 2999.71 ml  Output 2250 ml  Net 749.71 ml   Filed Weights   02/11/23 0344 02/12/23 0456 02/13/23 0604  Weight: 90.2 kg 90.2 kg 89 kg   Physical Exam General exam: Somnolent, not following commands.  Per nursing staff restless and agitated.  No nausea or vomiting reported. Respiratory system: Good saturation on 2 L; no using accessory muscle. Cardiovascular system:RRR.  No  rubs, no gallops, no JVD. Gastrointestinal system: Abdomen is obese, nondistended, soft and nontender. No organomegaly or masses felt.  Positive bowel sounds.  PEG tube in place. Central nervous system: No new focal neurological deficits.  Limited examination secondary to current mentation. Extremities: No cyanosis or clubbing. Skin: No petechiae. Psychiatry: Limited examination secondary to current mentation; flat affect appreciated.    Data Reviewed: I have personally reviewed following labs and imaging studies  CBC: Recent Labs  Lab 02/08/23 0425 02/11/23 0439  WBC 25.8* 25.8*  HGB 9.6* 8.4*  HCT 35.8* 32.7*  MCV 77.5* 80.0  PLT 269 206    Basic Metabolic Panel: Recent Labs  Lab 02/07/23 0407 02/08/23 0425 02/11/23 0439 02/12/23 1023  NA 145 149* 146* 143  K 4.4 3.9 4.4 4.9  CL 112* 112* 111 111  CO2 24 29 27 23   GLUCOSE 296* 176* 278* 359*  BUN 73* 57* 66* 59*  CREATININE 1.49* 1.25* 1.31* 1.21  CALCIUM 8.7* 8.7* 8.8* 9.1  PHOS  --  2.7  --  3.1   CBG: Recent Labs  Lab 02/12/23 1633 02/12/23 2013 02/13/23 0734 02/13/23 1104 02/13/23 1609  GLUCAP 320* 282* 344* 440* 438*   Scheduled Meds:  aspirin  325 mg Per Tube Daily   atorvastatin  40 mg Per Tube QHS   dexamethasone (DECADRON) injection  2 mg Intravenous Q12H   feeding supplement (PROSource TF20)  60 mL Per Tube BID   ferrous sulfate  300 mg Per Tube Daily   folic acid  1 mg Per Tube Daily  free water  200 mL Per Tube Q4H   gabapentin  100 mg Per Tube QHS   heparin  5,000 Units Subcutaneous Q8H   hydrALAZINE  25 mg Per Tube BID   insulin aspart  0-20 Units Subcutaneous TID WC   insulin aspart  3 Units Subcutaneous Q8H   insulin glargine-yfgn  12 Units Subcutaneous BID   isosorbide dinitrate  5 mg Per Tube BID   linaclotide  290 mcg Per Tube QAC breakfast   metoCLOPramide (REGLAN) injection  10 mg Intravenous Q6H   metoprolol tartrate  25 mg Per Tube BID   multivitamin with minerals  1 tablet  Per Tube Daily   mouth rinse  15 mL Mouth Rinse 4 times per day   Continuous Infusions:  feeding supplement (OSMOLITE 1.5 CAL) 60 mL/hr at 02/13/23 1634    LOS: 16 days    Vassie Loll, MD  02/13/2023, 7:23 PM

## 2023-02-13 NOTE — Inpatient Diabetes Management (Signed)
Inpatient Diabetes Program Recommendations  AACE/ADA: New Consensus Statement on Inpatient Glycemic Control  Target Ranges:  Prepandial:   less than 140 mg/dL      Peak postprandial:   less than 180 mg/dL (1-2 hours)      Critically ill patients:  140 - 180 mg/dL    Latest Reference Range & Units 02/13/23 07:34 02/13/23 11:04  Glucose-Capillary 70 - 99 mg/dL 409 (H) 811 (H)    Latest Reference Range & Units 02/11/23 07:12 02/11/23 11:05 02/11/23 16:39 02/11/23 19:53 02/12/23 11:35 02/12/23 16:33 02/12/23 20:13  Glucose-Capillary 70 - 99 mg/dL 914 (H) 782 (H) 956 (H) 172 (H) 354 (H) 320 (H) 282 (H)   Review of Glycemic Control  Diabetes history: DM2 Outpatient Diabetes medications: Lantus 5 units at bedtime, Jardiance 10 mg daily Current orders for Inpatient glycemic control: Semglee 12 units BID, Novolog 0-20 units TID, Novolog 0-10 units at bedtime; Decadron 2 mg Q12H, Jevity @ 60 ml/hr  Inpatient Diabetes Program Recommendations:    Insulin: Noted Semglee 12 units BID ordered on 02/12/23. CBGs 344 mg/dl and 213 mg/dl today.  Please consider changing Novolog 0-20 units to Q4H (since on tube feedings), discontinue Novolog 0-10 units at bedtime, and consider ordering Novolog 5 units Q4H for tube feeding coverage. If tube feeding is stopped or held then Novolog tube feeding coverage should also be stopped or held.  Thanks, Orlando Penner, RN, MSN, CDCES Diabetes Coordinator Inpatient Diabetes Program [phone] (Team Pager from 8am to 5pm)
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
## 2023-02-13 NOTE — Progress Notes (Signed)
Glucose was 440 before lunch.  Notified Dr. Gwenlyn Perking and he ordered to give SS novolog 20 units and that he would adjust long acting.  Was very restless earlier and gave dilaudid 0.5 mg IV which seemed to help with resting.  Tolerating tube feeding at 60/hr.  Noninteractive verbally.

## 2023-02-13 NOTE — Progress Notes (Signed)
Palliative: [name] is lying quietly in bed.  He appears acutely/chronically ill and very frail.  He will briefly open his eyes but not make eye contact.  He does not attempt to answer my orientation questions.  I do not believe that he can make his basic needs known.  There is no family at bedside at this time.  Conference with attending, bedside nursing staff, transition of care team related to patient condition, needs, goals of care, disposition.  Plan: At this point continue to treat the treatable.  Unfortunately, there has been no meaningful improvement for [name] 16 days status post stroke.  Daughter is requesting second opinion from another hospital.  25 minutes  Lillia Carmel, NP Palliative Medicine Team  Team Phone [phone]

## 2023-02-13 NOTE — Progress Notes (Signed)
Very restless in bed, had turned completely sideways in bed after pad , gown change and being pulled up in bed.  Moaning and restless. Gave dilaudid 0.5.   Earlier, some family visited from [place] and he seemed to recognize them but could not talk to them but did start crying.

## 2023-02-14 ENCOUNTER — Encounter: Payer: Self-pay | Admitting: *Deleted

## 2023-02-14 ENCOUNTER — Ambulatory Visit: Payer: Self-pay | Admitting: *Deleted

## 2023-02-14 DIAGNOSIS — I639 Cerebral infarction, unspecified: Secondary | ICD-10-CM | POA: Diagnosis not present

## 2023-02-14 DIAGNOSIS — F1410 Cocaine abuse, uncomplicated: Secondary | ICD-10-CM

## 2023-02-14 DIAGNOSIS — L89152 Pressure ulcer of sacral region, stage 2: Secondary | ICD-10-CM | POA: Diagnosis not present

## 2023-02-14 DIAGNOSIS — Z7189 Other specified counseling: Secondary | ICD-10-CM | POA: Diagnosis not present

## 2023-02-14 DIAGNOSIS — Z515 Encounter for palliative care: Secondary | ICD-10-CM | POA: Diagnosis not present

## 2023-02-14 DIAGNOSIS — I69391 Dysphagia following cerebral infarction: Secondary | ICD-10-CM | POA: Diagnosis not present

## 2023-02-14 DIAGNOSIS — I5022 Chronic systolic (congestive) heart failure: Secondary | ICD-10-CM | POA: Diagnosis not present

## 2023-02-14 DIAGNOSIS — I10 Essential (primary) hypertension: Secondary | ICD-10-CM | POA: Diagnosis not present

## 2023-02-14 LAB — GLUCOSE, CAPILLARY
Glucose-Capillary: 394 mg/dL — ABNORMAL HIGH (ref 70–99)
Glucose-Capillary: 409 mg/dL — ABNORMAL HIGH (ref 70–99)
Glucose-Capillary: 204 mg/dL — ABNORMAL HIGH (ref 70–99)
Glucose-Capillary: 192 mg/dL — ABNORMAL HIGH (ref 70–99)

## 2023-02-14 MED ORDER — HYDRALAZINE HCL 25 MG PO TABS: 25.0000 mg | ORAL_TABLET | Freq: Two times a day (BID) | ORAL | Status: DC

## 2023-02-14 MED ORDER — HYDROMORPHONE HCL 1 MG/ML IJ SOLN: 0.5000 mg | Freq: Four times a day (QID) | INTRAMUSCULAR | 0 refills | Status: DC | PRN

## 2023-02-14 MED ORDER — FREE WATER: 200.0000 mL | Status: DC

## 2023-02-14 MED ORDER — INSULIN GLARGINE-YFGN 100 UNIT/ML ~~LOC~~ SOLN: 16.0000 [IU] | Freq: Two times a day (BID) | SUBCUTANEOUS | Status: DC

## 2023-02-14 MED ORDER — INSULIN ASPART 100 UNIT/ML IJ SOLN
0.0000 [IU] | Freq: Four times a day (QID) | INTRAMUSCULAR | Status: DC
  Administered 2023-02-14: 20 [IU] via SUBCUTANEOUS
  Administered 2023-02-14: 7 [IU] via SUBCUTANEOUS

## 2023-02-14 MED ORDER — ACETAMINOPHEN 325 MG PO TABS: 650.0000 mg | ORAL_TABLET | ORAL | Status: DC | PRN

## 2023-02-14 MED ORDER — IPRATROPIUM-ALBUTEROL 0.5-2.5 (3) MG/3ML IN SOLN: 3.0000 mL | Freq: Four times a day (QID) | RESPIRATORY_TRACT | Status: DC | PRN

## 2023-02-14 MED ORDER — METOCLOPRAMIDE HCL 5 MG/ML IJ SOLN: 10.0000 mg | Freq: Three times a day (TID) | INTRAMUSCULAR | Status: DC

## 2023-02-14 MED ORDER — OSMOLITE 1.5 CAL PO LIQD: 1000.0000 mL | ORAL | Status: DC

## 2023-02-14 MED ORDER — INSULIN ASPART 100 UNIT/ML IJ SOLN: 0.0000 [IU] | INTRAMUSCULAR | 11 refills | Status: DC

## 2023-02-14 MED ORDER — ATORVASTATIN CALCIUM 40 MG PO TABS: 40.0000 mg | ORAL_TABLET | Freq: Every day | ORAL | Status: DC

## 2023-02-14 MED ORDER — LORAZEPAM 2 MG/ML IJ SOLN: 1.0000 mg | Freq: Three times a day (TID) | INTRAMUSCULAR | Status: DC | PRN

## 2023-02-14 MED ORDER — FERROUS SULFATE 300 (60 FE) MG/5ML PO SOLN: 300.0000 mg | Freq: Every day | ORAL | Status: DC

## 2023-02-14 MED ORDER — GABAPENTIN 250 MG/5ML PO SOLN: 100.0000 mg | Freq: Every day | ORAL | Status: DC

## 2023-02-14 MED ORDER — INSULIN ASPART 100 UNIT/ML IJ SOLN: 3.0000 [IU] | INTRAMUSCULAR | Status: DC

## 2023-02-14 MED ORDER — TORSEMIDE 20 MG PO TABS: 20.0000 mg | ORAL_TABLET | Freq: Every day | ORAL | Status: DC

## 2023-02-14 MED ORDER — ISOSORBIDE DINITRATE 5 MG PO TABS: 5.0000 mg | ORAL_TABLET | Freq: Two times a day (BID) | ORAL | Status: DC

## 2023-02-14 MED ORDER — POLYETHYLENE GLYCOL 3350 17 G PO PACK: 17.0000 g | PACK | Freq: Every day | ORAL | Status: DC | PRN

## 2023-02-14 MED ORDER — FOLIC ACID 1 MG PO TABS: 1.0000 mg | ORAL_TABLET | Freq: Every day | ORAL | Status: DC

## 2023-02-14 MED ORDER — PANTOPRAZOLE SODIUM 40 MG PO TBEC: 40.0000 mg | DELAYED_RELEASE_TABLET | Freq: Every day | ORAL | Status: DC

## 2023-02-14 MED ORDER — PROSOURCE TF20 ENFIT COMPATIBL EN LIQD: 60.0000 mL | Freq: Two times a day (BID) | ENTERAL | Status: DC

## 2023-02-14 MED ORDER — METOPROLOL TARTRATE 25 MG PO TABS: 25.0000 mg | ORAL_TABLET | Freq: Two times a day (BID) | ORAL | Status: DC

## 2023-02-14 MED ORDER — DEXAMETHASONE SODIUM PHOSPHATE 4 MG/ML IJ SOLN: 1.0000 mg | Freq: Two times a day (BID) | INTRAMUSCULAR | Status: DC

## 2023-02-14 MED ORDER — ASPIRIN 325 MG PO TABS: 325.0000 mg | ORAL_TABLET | Freq: Every day | ORAL | Status: DC

## 2023-02-14 NOTE — Patient Outreach (Signed)
  Care Coordination   Documentation  Note   02/14/2023 Name: [name] MRN: [id number] DOB: January 07, 1956  [name] is a 68 y.o. year old male who sees [name], Benita Area, MD for primary care. I  communicated with [facility] Liaison's regarding update on inpatient status.   What matters to the patients health and wellness today?  I did not speak with the patient or caregivers today.    Goals Addressed             This Visit's Progress    COMPLETED: Care Management Needs       Care Management Goals: Patient will transfer from [facility] to [facility] for short term rehab today Patient/daughter will talk with LCSW RE: Research Scientist (medical) will reach out to RN Care Manager at [phone] as needed  Patient has transferred to a long term care facility. Closing Care Management goals.         SDOH assessments and interventions completed:  No    are Coordination Interventions:  Yes, provided  Interventions Today    Flowsheet Row Most Recent Value  Chronic Disease   Chronic disease during today's visit Diabetes, Congestive Heart Failure (CHF), Other  [Recent CVA, inability to perform ADLs]  General Interventions   General Interventions Discussed/Reviewed Communication with, General Interventions Reviewed  [Periodically reviewed inpatient notes and reports since admission from [facility] on 01/28/23 due to CVA.]  Communication with RN  Cletis Fish, RN and Olam Ku, [facility] Liasons regarding status. Patient is being discharged today to [facility].]       Follow up plan: No further intervention required. Removed RN Care Manager from Care Team and closed open goals. Care Management team can reengage in the future if services are needed and are appropriate.  Encounter Outcome:  Patient Visit Completed   [name], RN, BSN [place]  [facility] RN  Care Manager Direct Dial: [phone]

## 2023-02-14 NOTE — TOC Transition Note (Addendum)
 Transition of Care [facility]) - Discharge Note   Patient Details  Name: [name] MRN: [id number] Date of Birth: 12/20/1955  Transition of Care [facility]) CM/SW Contact:  Sharlyne Stabs, RN Phone Number: 02/14/2023, 11:29 AM   Clinical Narrative:   Patient is medically ready to discharge back to [facility]. Daughter continues to want other placement.  TOC offered to have [facility] review chart.  DJ is able to accept today due to low census. Palliative and MD called his daughter to explain. Daughter called TOC SW to accept the offers. MD updated.    TOC sending DC summary now, RN calling report to  Room 323 at [phone] accepting MD [facility] MD handoff [phone] Then call [facility] for transport.    Final next level of care: Long Term Acute Care (LTAC) Barriers to Discharge: Barriers Resolved   Patient Goals and CMS Choice Patient states their goals for this hospitalization and ongoing recovery are:: agreeable to [facility] CMS Medicare.gov Compare Post Acute Care list provided to:: Patient Represenative (must comment) Choice offered to / list presented to : Adult Children     Discharge Placement        Discharge Plan and Services Additional resources added to the After Visit Summary for   In-house Referral: Clinical Social Work Discharge Planning Services: CM Consult Post Acute Care Choice: Skilled Nursing Facility                Social Drivers of Health (SDOH) Interventions SDOH Screenings   Food Insecurity: Patient Unable To Answer (01/28/2023)  Housing: Patient Unable To Answer (01/28/2023)  Transportation Needs: Patient Unable To Answer (01/28/2023)  Utilities: Patient Unable To Answer (01/28/2023)  Alcohol  Screen: Low Risk  (12/14/2022)  Depression (PHQ2-9): Low Risk  (12/14/2022)  Financial Resource Strain: Low Risk  (12/14/2022)  Physical Activity: Inactive (12/14/2022)  Social Connections: Patient Unable To Answer (01/28/2023)  Stress: Stress Concern Present  (12/14/2022)  Tobacco Use: High Risk (01/31/2023)  Health Literacy: Adequate Health Literacy (12/14/2022)     Readmission Risk Interventions    02/13/2023   11:26 AM 02/13/2023   11:14 AM 02/10/2023   10:15 AM  Readmission Risk Prevention Plan  Transportation Screening Complete Complete Complete  Medication Review Oceanographer) Complete Complete Complete  HRI or Home Care Consult Complete Complete Complete  SW Recovery Care/Counseling Consult Complete Complete Complete  Palliative Care Screening Complete Complete Not Applicable  Skilled Nursing Facility Complete Complete Complete

## 2023-02-14 NOTE — Progress Notes (Signed)
 Nutrition Follow-up  DOCUMENTATION CODES:   Non-severe (moderate) malnutrition in context of acute illness/injury  INTERVENTION:   Continue TF via PEG: Osmolite 1.5 at 60 ml/h  Prosource TF20 60 ml BID Provides 2320 kcal, 130 gm protein, 1097 ml free water , 293 gm carbohydrates daily.  Free water  flushes 200 ml q4h for a total of 2297 ml free water  daily.  Continue MVI with minerals daily via tube.   NUTRITION DIAGNOSIS:   Moderate Malnutrition related to acute illness (acute CVA) as evidenced by mild muscle depletion, mild fat depletion.  Ongoing   GOAL:   Patient will meet greater than or equal to 90% of their needs  Met with TF at goal rate  MONITOR:   TF tolerance, Diet advancement  REASON FOR ASSESSMENT:   Rounds    ASSESSMENT:   68 yo male admitted with acute ischemic CVA. PMH includes HLD, HTN, CAD, CKD, ischemic cardiomyopathy, cocaine abuse, DM-2, CHF. Recent admission for acute HF, discharged to SNF on 01/17/23.  Patient remains NPO. He has been tolerating tube feeding at goal rate: Osmolite 1.5 at 60 ml/h with Prosource TF20 60 ml BID via PEG. Per chart review, daughter has set a limited time trial (6-8 weeks) for tube feeding. He did not tolerate a fiber containing formula (Jevity 1.5), but has tolerated a fiber free formula without difficulty so far.   Labs reviewed.  CBG: 394-409 Insulin  is being adjusted.   Medications reviewed and include decadron , ferrous sulfate , folic acid , novolog , semglee , reglan  10 mg q6h, MVI with minerals.  Admission weight: 95.3 kg Current weight: 89.5 kg  Diet Order:   Diet Order     None       EDUCATION NEEDS:   No education needs have been identified at this time  Skin:  Skin Assessment: Skin Integrity Issues: Skin Integrity Issues:: Stage II Stage II: sacrum  Last BM:  2/4  Height:   Ht Readings from Last 1 Encounters:  01/28/23 5' 9 (1.753 m)    Weight:   Wt Readings from Last 1 Encounters:   02/14/23 89.5 kg    Ideal Body Weight:  72.7 kg  BMI:  Body mass index is 29.14 kg/m.  Estimated Nutritional Needs:   Kcal:  2200-2400  Protein:  120-140 gm  Fluid:  2.2-2.4 L   Suzen HUNT RD, LDN, CNSC Contact Inpatient RD using Secure Chat. If unavailable, use group chat RD Inpatient via Secure Chat in EPIC.

## 2023-02-14 NOTE — Progress Notes (Signed)
 Palliative: Mr. [name] is lying in bed.  He continues to appear acutely/chronically ill and very frail.  He does not interact with me in any meaningful way.  He clearly cannot make his basic needs known.  There is no family at bedside at this time.  Face-to-face conference with transition of care team related to patient condition, needs, disposition.  Call to daughter/HCPOA, [name].  We talk about Mr. [name]'s continued struggle after stroke.  We talk about my visit with Mr. [name] this morning, how he was unable to communicate with me.  I shared the nursing report that family members visited last night and although Mr. [name] seem to be able to recognize them he could not communicate and started to cry.  We talk about options for long-term acute care hospital stay.  We talked about the benefits of these facilities for furthering physical therapy and treatments.  Russell states that she needs time to consider.  Conference with attending, bedside nursing staff, transition of care team related to patient condition, needs, goals of care, disposition.  Plan: Discharging [facility] to [facility].  Continue to treat the treatable but no CPR or intubation.  Time for outcomes.  [name] had set a limited time trial (6 to 8 weeks) for tube feeding.            50 minutes  Russell Todt, NP Palliative medicine team Team phone [phone]

## 2023-02-14 NOTE — Discharge Summary (Signed)
 Physician Discharge Summary   Patient: [name] MRN: [id number] DOB: February 12, 1955  Admit date:     01/28/2023  Discharge date: 02/14/23  Discharge Physician: Eric Nunnery   PCP: Carlette Benita Area, MD   Recommendations at discharge:  Continue close monitoring of patient's CBGs with further adjustment to hypoglycemic regimen as needed Follow response and brain images after completing steroids tapering (as recommended by neurosurgery). Reassess blood pressure and adjust antihypertensive regimen.   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   Acute metabolic encephalopathy   Acute gout   Coronary artery disease   Ischemic cardiomyopathy   Status post coronary artery stent placement   PAD (peripheral artery disease) (HCC)   Frequent falls   Dysphagia due to recent cerebrovascular accident (CVA)   S/P percutaneous endoscopic gastrostomy (PEG) tube placement 02/02/23   Essential hypertension   Tobacco use disorder   Cocaine use   OSA on CPAP   Hypertensive heart disease   History of MI (myocardial infarction)   Cocaine abuse (HCC)   Hyperlipidemia, mixed   CHF (congestive heart failure) (HCC)   Pressure injury of skin   Altered mental status   Dysphagia   Malnutrition of moderate degree    Brief Admission History:  [name] is a 68 year old male with chronic combined systolic and diastolic heart failure, hyperlipidemia, type 2 diabetes mellitus, diabetic polyneuropathy, hypertension, polysubstance abuse, stage III CKD history of cocaine abuse, failure to thrive, coronary artery disease, ischemic cardiomyopathy, history of LV mural thrombus, OSA, history of Fournier gangrene, GERD, who was recently hospitalized for acute heart failure and discharged to [facility].   He was sent to the ED by EMS for lethargic and confusion since about 7 PM  that evening.  He reportedly has normally been alert and oriented.  He was not answering questions.  Stroke was suspected and he was seen by teleneurologist.  TNK was not given as he was outside the window.  Neurology recommended MRI brain and routine stroke workup.  They recommended aspirin  325 mg daily.   -Patient is hemodynamically stable and Reitnauer tolerating tube feedings without any nausea or vomiting.  Will transfer him to [facility] facility for further care, management and rehabilitation.  Assessment and Plan: 1)Acute ischemic CVA with Dysphagia - MRI brain positive for small acute infarct in the left insular white matter -Neurology consult appreciated -A1c 7.2, LDL 29 -TTE with bubble study without evidence of LV thrombus, EF is 25 to 30% with wall motion abnormalities, Agitated saline contrast bubble study was negative, with no evidence of any interatrial shunt , no mitral stenosis, no aortic stenosis - c/n ASA 325mg  daily and atorvastatin  for secondary prevention. -Awaiting tolerance of tube feeding prior to discharging to SNF rehab   2)Meningioma in the Right Middle Cranial Fossa with Edema in the adjacent Temporal Lobe-- - Given his persistent encephalopathy, neurosurgeon Dr. Colon on 01/30/23 - Recommend trial of dexamethasone  to see if patient improves.   -He recommended 4 mg BID.  If patient improves on this, he said to discharge patient on oral decadron  and follow up with him in the office to discuss surgical removal of the meningioma. 02/11/23 -Mentation -improving  -I reached out to Dr. Victory Colon (Neurosurgeon) --[phone]-- --he recommends tapering down Decadron  from 4 mg twice daily to 2 mg twice daily for 1 week (until 02/12/23); then 1 mg twice daily for 1 week (startet  on 02/13/23); then 1 mg daily for 1 week (starting 02/21/23) then stop.   3)Acute Encephalopathy -due to #1 #2 and # 4 above -Neurology Consult appreciated - EEG no epileptic activity seen; c/w  encephalopathy -Neurology, palliative care and neurosurgery input appreciated --please see above 02/11/23 -Serum ammonia is not elevated -ABG without elevated CO2 -Continues to have episodes of disorientation and restlessness at times   4)HyperNatremia/Hyperchloremia--- pt has free water  deficits due to Stroke Related Dysphagia -Sodium and chloride improving with free water  flushes via PEG and IV fluids -- Patient's sodium and renal function improved; continue to follow trend. -Maintain adequate hydration. -Patient's sodium level has stabilized and within normal limits -Continue free water  along with tube feedings.   5)Dysphagia with Swallowing Difficulties/Nutrition--- due to #1 #2 above -Speech therapy appreciated -Status post PEG tube placement on 02/02/2023 02/11/23 -Patient did well on IV Reglan --was on the routine tube feeding without emesis Dose of IV Reglan  was around noon on 02/06/2023--without IV regular patient on 02/07/2023 started to have episode of emesis again -iv Reglan  restarted on 02/07/2023 for GI prokinetic effects--tube feeding has been resumed and following discussions with patient's healthcare power of attorney GI service has been consulted. -Abdominal x-ray not demonstrating any obstruction and tube in adequate place. -Dietitian consult for tube feeding appreciated; continue to follow recommendations. --Speech pathologist consult requested--to see if patient can have trial of oral intake. -After discussing with dietitian to feedings has been changed from Jevity to Osmolite to assess for tolerance. -Speech pathologist recommending NPO still after trial session on 02/10/23. -No further emesis today on 02/11/2023 -- Continue to titrate up tube feeding to goal if no further emesis   6)AKI----acute kidney injury on CKD stage -3B  - Creatinine improving with increased water  flushes and tube feeding---hydration - renally adjust medications, avoid nephrotoxic agents / dehydration   / hypotension    7)Acute respiratory failure with hypoxia due to Aspiration pneumonia -Patient has completed antibiotic therapy. -Currently requiring 2 L of oxygen  via nasal cannula -Continue bronchodilators and mucolytics. -Continue supportive care.   8)DM2 A1c 7.2 reflecting uncontrolled DM with hyperglycemia PTA Use Novolog /Humalog Sliding scale insulin  with Accu-Cheks/Fingersticks as ordered  -Continue adjusted dose of insulin  therapy using Semglee  twice a day and NovoLog  coverage given ongoing tube feedings and the use of his steroids.   9)History of polysubstance abuse including cocaine in the past - he is currently residing in a SNF - UDS negative    10)Normocytic Anemia in the setting of underlying CKD 3B - -Hgb currently greater than 8 -No bleeding concerns monitor closely and transfuse as clinically indicated   11) leukocytosis -No frank history source or signs of infection currently -Most likely associated with steroid use -Patient is afebrile    12)Social/ethics--- palliative care consult appreciated patient is a DNR/DNI Goals of care Discussion: We discussed goals of care for [name] .   -- Patient with acute infarct in the left insular white matter (MRI Brain 01/28/23) -Known meningioma in the right middle cranial fossa with edema in the adjacent temporal lobe (MRI Brain 01/28/23) -Stroke related dysphagia--status post PEG tube placement on 02/02/23 -Poor tolerance of tube feeding with high residuals -Persistent hypernatremia due to free water  deficit in the setting of lack of oral intake -- Discussed with patient's daughter that overall prognosis is poor and very low purposeful recovery rate anticipated. -- [name] verbalizes understanding of overall poor prognosis. -She requested will continue current level of care -Patient remains DNR/DNI -Patient daughter  will consider de-escalation to hospice if patient does Not improve over the next several  days. -Patient for the most part with stable vital signs and now tolerating tube feedings; patient's daughter was requesting for patient to be transferred to [facility] in [place] for second opinion and further care; expressed not wanting patient to be discharged to [facility].  Unfortunately patient has not been accepted.   13)PAD/CAD--status post prior angioplasty and stent placement -Continue aspirin , atorvastatin  and metoprolol    14)HFrEF--chronic systolic dysfunction CHF---??  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 The entire inferior wall, apical lateral segment,  apical septal segment, and apical anterior segment are akinetic. The  anterior wall, antero-lateral wall, anterior septum, mid inferoseptal segment, and  basal inferoseptal segment are hypokinetic.  -c/n Metoprolol   avoid ACEI/ARB/ARNI and  aldactone due to renal concerns Ok to use Hydralazine /Isosordil combo in lieu of ACEI/ARB -Home torsemide  on hold due to AKI and dehydration concerns   15)Pressure injury to sacrum stage 2, present on admission  -Nursing care as outlined -Continue constant repositioning and preventive measures.   16) moderate protein calorie malnutrition -Secondary to residual dysphagia from stroke -continue tube feedings.   17) hyperglycemia -In the setting of Decadron  usage and tube feedings -Continue sliding scale insulin  and restart Semglee . -Follow CBG fluctuation.  Consultants: Cardiology service, neurology, neurosurgery, general surgery dietitian, speech therapy and physical therapy. Procedures performed: See below for x-ray report; 2D echo. Disposition: Long term care facility Diet recommendation: Continue tube  feedings.  DISCHARGE MEDICATION: Allergies as of 02/14/2023       Reactions   Clopidogrel  Other (See Comments)   Drowsy, Skin irritation        Medication List     STOP taking these medications    aspirin  EC 81 MG tablet Commonly known as: Aspirin  Low Dose Replaced by: aspirin  325 MG tablet   carvedilol  3.125 MG tablet Commonly known as: COREG    ferrous sulfate  325 (65 FE) MG tablet Replaced by: ferrous sulfate  300 (60 Fe) MG/5ML syrup   gabapentin  300 MG capsule Commonly known as: NEURONTIN  Replaced by: gabapentin  250 MG/5ML solution   insulin  glargine 100 UNIT/ML Solostar Pen Commonly known as: LANTUS    Jardiance  10 MG Tabs tablet Generic drug: empagliflozin    loperamide  2 MG capsule Commonly known as: IMODIUM    midodrine  10 MG tablet Commonly known as: PROAMATINE    ondansetron  4 MG disintegrating tablet Commonly known as: ZOFRAN -ODT       TAKE these medications    acetaminophen  325 MG tablet Commonly known as: TYLENOL  Take 2 tablets (650 mg total) by mouth every 4 (four) hours as needed for mild pain (pain score 1-3) (or temp > 37.5 C (99.5 F)).   aspirin  325 MG tablet Place 1 tablet (325 mg total) into feeding tube daily. Start taking on: February 15, 2023 Replaces: aspirin  EC 81 MG tablet   atorvastatin  40 MG tablet Commonly known as: LIPITOR  Place 1 tablet (40 mg total) into feeding tube at bedtime. What changed: how to take this   dexamethasone  4 MG/ML injection Commonly known as: DECADRON  Inject 0.25 mLs (1 mg total) into the vein every 12 (twelve) hours.   feeding supplement (OSMOLITE 1.5 CAL) Liqd Place 1,000 mLs into feeding tube continuous. 60  mL/h continuous infusion   feeding supplement (PROSource TF20) liquid Place 60 mLs into feeding tube 2 (two) times daily.   ferrous sulfate  300 (60 Fe) MG/5ML syrup Place 5 mLs (300 mg total) into feeding tube daily. Start taking on: February 15, 2023 Replaces: ferrous sulfate  325 (65 FE) MG  tablet   folic acid  1 MG tablet Commonly known as: FOLVITE  Place 1 tablet (1 mg total) into feeding tube daily. Start taking on: February 15, 2023 What changed: how to take this   free water  Soln Place 200 mLs into feeding tube every 4 (four) hours.   gabapentin  250 MG/5ML solution Commonly known as: NEURONTIN  Place 2 mLs (100 mg total) into feeding tube at bedtime. Replaces: gabapentin  300 MG capsule   hydrALAZINE  25 MG tablet Commonly known as: APRESOLINE  Place 1 tablet (25 mg total) into feeding tube 2 (two) times daily.   HYDROmorphone  1 MG/ML injection Commonly known as: DILAUDID  Inject 0.5 mLs (0.5 mg total) into the vein every 6 (six) hours as needed for severe pain (pain score 7-10).   insulin  aspart 100 UNIT/ML injection Commonly known as: novoLOG  Inject 0-20 Units into the skin every 4 (four) hours.   insulin  aspart 100 UNIT/ML injection Commonly known as: novoLOG  Inject 3 Units into the skin every 4 (four) hours.   insulin  glargine-yfgn 100 UNIT/ML injection Commonly known as: SEMGLEE  Inject 0.16 mLs (16 Units total) into the skin 2 (two) times daily.   ipratropium-albuterol  0.5-2.5 (3) MG/3ML Soln Commonly known as: DUONEB Take 3 mLs by nebulization every 6 (six) hours as needed.   isosorbide  dinitrate 5 MG tablet Commonly known as: ISORDIL  Place 1 tablet (5 mg total) into feeding tube 2 (two) times daily.   linaclotide  145 MCG Caps capsule Commonly known as: LINZESS  Take 1 capsule (145 mcg total) by mouth daily before breakfast.   LORazepam  2 MG/ML injection Commonly known as: ATIVAN  Inject 0.5 mLs (1 mg total) into the vein every 8 (eight) hours as needed for anxiety or sedation (agitation).   metoCLOPramide  5 MG/ML injection Commonly known as: REGLAN  Inject 2 mLs (10 mg total) into the vein every 8 (eight) hours.   metoprolol  tartrate 25 MG tablet Commonly known as: LOPRESSOR  Place 1 tablet (25 mg total) into feeding tube 2 (two) times daily.    pantoprazole  40 MG tablet Commonly known as: PROTONIX  Take 1 tablet (40 mg total) by mouth daily. Per tube What changed: additional instructions   polyethylene glycol 17 g packet Commonly known as: MIRALAX  / GLYCOLAX  Place 17 g into feeding tube daily as needed for mild constipation. What changed: how to take this   prochlorperazine  10 MG tablet Commonly known as: COMPAZINE  Take 1 tablet (10 mg total) by mouth every 6 (six) hours as needed for nausea or vomiting.   torsemide  20 MG tablet Commonly known as: DEMADEX  Take 1 tablet (20 mg total) by mouth daily. 2 What changed:  how much to take additional instructions               Discharge Care Instructions  (From admission, onward)           Start     Ordered   02/14/23 0000  Discharge wound care:       Comments: Stage II pressure injury in his sacrum; no signs of superimposed infection.  Present at time of admission.  Keep area clean and dry, consult repositioning and preventive measures recommended.   02/14/23 1419  Contact information for follow-up providers     Colon Shove, MD Follow up in 3 week(s).   Specialty: Neurosurgery Contact information: [address] [place] [address] [phone]              Contact information for after-discharge care     Destination     [facility] [place] .   Service: Long Term Acute Care Contact information: [address] [facility] [place]  [address] [phone]                    Discharge Exam: Filed Weights   02/12/23 0456 02/13/23 0604 02/14/23 0553  Weight: 90.2 kg 89 kg 89.5 kg   General exam: Somnolent, not following commands.  Per nursing staff restless and agitated.  No nausea or vomiting reported. Respiratory system: Good saturation on 2 L; no using accessory muscle. Cardiovascular system:RRR.  No rubs, no gallops, no JVD. Gastrointestinal system: Abdomen is obese,  nondistended, soft and nontender. No organomegaly or masses felt.  Positive bowel sounds.  PEG tube in place. Central nervous system: No new focal neurological deficits.  Limited examination secondary to current mentation. Extremities: No cyanosis or clubbing. Skin: No petechiae. Psychiatry: Limited examination secondary to current mentation; flat affect appreciated.      Condition at discharge: stable  The results of significant diagnostics from this hospitalization (including imaging, microbiology, ancillary and laboratory) are listed below for reference.   Imaging Studies: DG Abd 1 View Result Date: 02/07/2023 CLINICAL DATA:  Persistent emesis. EXAM: ABDOMEN - 1 VIEW COMPARISON:  02/03/2023 FINDINGS: Technically limited due to difficulty with positioning. Patient is rotated. Gastrostomy tube is faintly visualized. No gaseous bowel distension to suggest obstruction. There is formed stool in the distal colon. Portions of the left abdomen are not included in the field of view. IMPRESSION: No bowel obstruction or abnormal distention. Small volume formed stool in the distal colon. Electronically Signed   By: Andrea Gasman M.D.   On: 02/07/2023 19:23   DG ABDOMEN PEG TUBE LOCATION Result Date: 02/03/2023 CLINICAL DATA:  Peg tube malfunction EXAM: ABDOMEN - 1 VIEW COMPARISON:  11/03/2022, CT 01/21/2023 FINDINGS: Gastrostomy tube balloon projects over the body of the stomach. Small amount of contrast at the gastric fundus and distal stomach. No gross extravasation IMPRESSION: Gastrostomy tube balloon projects over the body of the stomach. Electronically Signed   By: Luke Bun M.D.   On: 02/03/2023 19:50   EEG adult Result Date: 01/31/2023 Shelton Arlin KIDD, MD     01/31/2023  8:39 AM Patient Name: [name] MRN: [id number] Epilepsy Attending: Arlin KIDD Shelton Referring Physician/Provider: Vicci Afton CROME, MD Date: 01/30/2023 Duration: 22.52 mins Patient history: 68yo M with ams getting  eeg to evaluate for seizure Level of alertness: Awake AEDs during EEG study: GBP Technical aspects: This EEG study was done with scalp electrodes positioned according to the 10-20 International system of electrode placement. Electrical activity was reviewed with band pass filter of 1-70Hz , sensitivity of 7 uV/mm, display speed of 94mm/sec with a 60Hz  notched filter applied as appropriate. EEG data were recorded continuously and digitally stored.  Video monitoring was available and reviewed as appropriate. Description: EEG showed continuous generalized polymorphic 3 to 6 Hz theta-delta slowing. Hyperventilation and photic stimulation were not performed.   ABNORMALITY - Continuous slow, generalized IMPRESSION: This study is suggestive of moderate diffuse encephalopathy. No seizures or epileptiform discharges were seen throughout the recording. Priyanka KIDD Shelton   ECHOCARDIOGRAM COMPLETE  BUBBLE STUDY Result Date: 01/30/2023    ECHOCARDIOGRAM REPORT   Patient Name:   [name] Date of Exam: 01/30/2023 Medical Rec #:  [id number]        Height:       69.0 in Accession #:    [id number]       Weight:       214.5 lb Date of Birth:  Nov 03, 1955        BSA:          2.128 m Patient Age:    67 years         BP:           95/57 mmHg Patient Gender: M                HR:           91 bpm. Exam Location:  [facility] Procedure: 2D Echo, Cardiac Doppler, Color Doppler, Saline Contrast Bubble Study            and Intracardiac Opacification Agent Indications:    Stroke  History:        Patient has prior history of Echocardiogram examinations, most                 recent 12/22/2022. Cardiomyopathy and CHF, CAD and Previous                 Myocardial Infarction; Risk Factors:Hypertension, Current                 Smoker, Dyslipidemia, Sleep Apnea and Diabetes. Cocaine use.                 Opiate overdose. LV thrombus.  Sonographer:    Ellouise Mose RDCS Referring Phys: [id number] [facility] MALVA KREBS  Sonographer Comments: Technically  difficult study due to poor echo windows. Image acquisition challenging due to uncooperative patient. Patient moaning moving and screaming throughout exam. IMPRESSIONS  1. No evidence of LV thrombus. Left ventricular ejection fraction, by estimation, is 25 to 30%. The left ventricle has severely decreased function. The left ventricle demonstrates regional wall motion abnormalities (see scoring diagram/findings for description). The left ventricular internal cavity size was mildly dilated. Left ventricular diastolic parameters are indeterminate. Elevated left atrial pressure.  2. Right ventricular systolic function is moderately reduced. The right ventricular size is normal. Tricuspid regurgitation signal is inadequate for assessing PA pressure.  3. The mitral valve is normal in structure. Trivial mitral valve regurgitation. No evidence of mitral stenosis.  4. The aortic valve was not well visualized. Aortic valve regurgitation is not visualized. No aortic stenosis is present.  5. The inferior vena cava is normal in size with greater than 50% respiratory variability, suggesting right atrial pressure of 3 mmHg.  6. Agitated saline contrast bubble study was negative, with no evidence of any interatrial shunt. Comparison(s): No significant change from prior study. FINDINGS  Left Ventricle: No evidence of LV thrombus. Left ventricular ejection fraction, by estimation, is 25 to 30%. The left ventricle has severely decreased function. The left ventricle demonstrates regional wall motion abnormalities. Definity  contrast agent was given IV to delineate the left ventricular endocardial borders. The left ventricular internal cavity size was mildly dilated. There is no left ventricular hypertrophy. Left ventricular diastolic parameters are indeterminate. Elevated left atrial pressure.  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 The entire inferior wall, apical lateral segment, apical septal segment, and apical anterior segment  are akinetic. The anterior wall, antero-lateral wall, anterior septum, mid  inferoseptal segment, and basal inferoseptal segment are hypokinetic. Right Ventricle: The right ventricular size is normal. No increase in right ventricular wall thickness. Right ventricular systolic function is moderately reduced. Tricuspid regurgitation signal is inadequate for assessing PA pressure. Left Atrium: Left atrial size was normal in size. Right Atrium: Right atrial size was normal in size. Pericardium: There is no evidence of pericardial effusion. Mitral Valve: The mitral valve is normal in structure. Trivial mitral valve regurgitation. No evidence of mitral valve stenosis. Tricuspid Valve: The tricuspid valve is normal in structure. Tricuspid valve regurgitation is trivial. No evidence of tricuspid stenosis. Aortic Valve: The aortic valve was not well visualized. Aortic valve regurgitation is not visualized. No aortic stenosis is present. Pulmonic Valve: The pulmonic valve was not well visualized. Pulmonic valve regurgitation is not visualized. No evidence of pulmonic stenosis. Aorta: The aortic root and ascending aorta are structurally normal, with no evidence of dilitation. Venous: The inferior vena cava is normal in size with greater than 50% respiratory variability, suggesting right atrial pressure of 3 mmHg. IAS/Shunts: The interatrial septum was not well visualized. Agitated saline contrast was given intravenously to evaluate for intracardiac shunting. Agitated saline contrast bubble study was negative, with no evidence of any interatrial shunt.  LEFT VENTRICLE PLAX 2D LVIDd:         5.90 cm      Diastology LVIDs:         4.70 cm      LV e' medial:    2.61 cm/s LV PW:         1.30 cm      LV E/e' medial:  43.7 LV IVS:        1.20 cm      LV e' lateral:   5.44 cm/s LVOT diam:     2.10 cm      LV E/e' lateral: 21.0 LV SV:         68 LV SV Index:   32 LVOT Area:     3.46 cm  LV Volumes (MOD) LV vol d, MOD A2C: 181.0 ml LV  vol d, MOD A4C: 149.0 ml LV vol s, MOD A2C: 132.0 ml LV vol s, MOD A4C: 118.0 ml LV SV MOD A2C:     49.0 ml LV SV MOD A4C:     149.0 ml LV SV MOD BP:      40.1 ml RIGHT VENTRICLE             IVC RV S prime:     12.00 cm/s  IVC diam: 2.70 cm TAPSE (M-mode): 1.2 cm LEFT ATRIUM             Index        RIGHT ATRIUM           Index LA diam:        4.80 cm 2.26 cm/m   RA Area:     18.40 cm LA Vol (A2C):   55.4 ml 26.03 ml/m  RA Volume:   51.10 ml  24.01 ml/m LA Vol (A4C):   53.3 ml 25.04 ml/m LA Biplane Vol: 55.8 ml 26.22 ml/m  AORTIC VALVE LVOT Vmax:   118.00 cm/s LVOT Vmean:  77.800 cm/s LVOT VTI:    0.196 m  AORTA Ao Root diam: 3.60 cm Ao Asc diam:  2.80 cm MITRAL VALVE MV Area (PHT): 4.89 cm     SHUNTS MV Decel Time: 155 msec     Systemic VTI:  0.20 m MV E velocity: 114.00  cm/s  Systemic Diam: 2.10 cm [name] Electronically signed by Diannah Late [name] Signature Date/Time: 01/30/2023/5:02:11 PM    Final    DG CHEST PORT 1 VIEW Result Date: 01/30/2023 CLINICAL DATA:  Aspiration pneumonia. EXAM: PORTABLE CHEST 1 VIEW COMPARISON:  Chest x-ray dated January 28, 2023. FINDINGS: Unchanged cardiomegaly. New focal opacity in the right upper lobe. Unchanged linear scarring/atelectasis at the right lung base. No pneumothorax or pleural effusion. No acute osseous abnormality. IMPRESSION: 1. New focal opacity in the right upper lobe, concerning for pneumonia. Electronically Signed   By: Elsie ONEIDA Shoulder M.D.   On: 01/30/2023 10:03   MR BRAIN WO CONTRAST Result Date: 01/28/2023 CLINICAL DATA:  Altered mental status with acute stroke suspected. Left-sided weakness EXAM: MRI HEAD WITHOUT CONTRAST TECHNIQUE: Multiplanar, multiecho pulse sequences of the brain and surrounding structures were obtained without intravenous contrast. COMPARISON:  Head CT and CTA from earlier today FINDINGS: Brain: Small acute infarct in the left subinsular white matter. Extra-axial mass in the right middle cranial fossa  with swelling of the adjacent temporal lobe, history of meningioma. Moderate chronic infarct in the right occipital lobe. Generalized brain atrophy with chronic small vessel ischemia in the deep cerebral white matter. Chronic microhemorrhage in the central pons. Vascular: Normal flow voids. Skull and upper cervical spine: No acute finding Sinuses/Orbits: Right mastoid opacification with negative nasopharynx, progressed from before. Left mastoid opacification has improved. IMPRESSION: 1. Small acute infarct in the left insular white matter. 2. Known meningioma in the right middle cranial fossa with edema in the adjacent temporal lobe similar to 11/06/2022. 3. Chronic ischemic injury as described. Electronically Signed   By: Dorn Roulette M.D.   On: 01/28/2023 10:20   DG CHEST PORT 1 VIEW Result Date: 01/28/2023 CLINICAL DATA:  68 year old male with history of leukocytosis. EXAM: PORTABLE CHEST 1 VIEW COMPARISON:  Chest x-ray 01/22/2023. FINDINGS: Lung volumes are low. Opacity in the right mid to lower lung which may reflect underlying atelectasis and/or consolidation. Small right pleural effusion. No left pleural effusion. No pneumothorax. Cephalization of the pulmonary vasculature, without frank pulmonary edema. Moderate cardiomegaly. The patient is rotated to the right on today's exam, resulting in distortion of the mediastinal contours and reduced diagnostic sensitivity and specificity for mediastinal pathology. IMPRESSION: 1. Low lung volumes with atelectasis and/or consolidation in the right mid to lower lung. 2. Small right pleural effusion. 3. Cardiomegaly with pulmonary venous congestion, but no frank pulmonary edema. Electronically Signed   By: Toribio Aye M.D.   On: 01/28/2023 07:56   CT ANGIO HEAD NECK W WO CM Result Date: 01/28/2023 CLINICAL DATA:  Confusion and hemi-neglect EXAM: CT ANGIOGRAPHY HEAD AND NECK WITH AND WITHOUT CONTRAST TECHNIQUE: Multidetector CT imaging of the head and neck  was performed using the standard protocol during bolus administration of intravenous contrast. Multiplanar CT image reconstructions and MIPs were obtained to evaluate the vascular anatomy. Carotid stenosis measurements (when applicable) are obtained utilizing NASCET criteria, using the distal internal carotid diameter as the denominator. RADIATION DOSE REDUCTION: This exam was performed according to the departmental dose-optimization program which includes automated exposure control, adjustment of the mA and/or kV according to patient size and/or use of iterative reconstruction technique. CONTRAST:  75mL OMNIPAQUE  IOHEXOL  350 MG/ML SOLN COMPARISON:  None Available. FINDINGS: CTA NECK FINDINGS Skeleton: No acute abnormality or high grade bony spinal canal stenosis. Other neck: Normal pharynx, larynx and major salivary glands. No cervical lymphadenopathy. Unremarkable thyroid  gland. Upper chest: There are multiple enlarged upper  right mediastinal lymph nodes that measure up to 19 mm. Aortic arch: There is calcific atherosclerosis of the aortic arch. Conventional 3 vessel aortic branching pattern. RIGHT carotid system: No dissection, occlusion or aneurysm. Mild atherosclerotic calcification at the carotid bifurcation without hemodynamically significant stenosis. LEFT carotid system: No dissection, occlusion or aneurysm. There is mixed density atherosclerosis extending into the proximal ICA, resulting in 50% stenosis. Vertebral arteries: Codominant configuration. There is no dissection, occlusion or flow-limiting stenosis to the skull base (V1-V3 segments). CTA HEAD FINDINGS POSTERIOR CIRCULATION: Vertebral arteries are normal. No proximal occlusion of the anterior or inferior cerebellar arteries. Basilar artery is normal. Superior cerebellar arteries are normal. Posterior cerebral arteries are normal. ANTERIOR CIRCULATION: There is bilateral atherosclerotic calcification of the internal carotid arteries at the skull  base with severe stenosis of the distal right cavernous segment and the right clinoid segment. Anterior cerebral arteries are normal. Middle cerebral arteries are normal. Venous sinuses: As permitted by contrast timing, patent. Anatomic variants: None Review of the MIP images confirms the above findings. IMPRESSION: 1. No emergent large vessel occlusion. 2. Severe stenosis of the distal cavernous segment and the right clinoid segment of the right internal carotid artery. 3. A 50% stenosis of the proximal left internal carotid artery. 4. Multiple enlarged upper right mediastinal lymph nodes that measure up to 19 mm. Aortic Atherosclerosis (ICD10-I70.0). Electronically Signed   By: Franky Stanford M.D.   On: 01/28/2023 02:47   CT HEAD CODE STROKE WO CONTRAST Result Date: 01/28/2023 CLINICAL DATA:  Code stroke.  Confusion and lethargy EXAM: CT HEAD WITHOUT CONTRAST TECHNIQUE: Contiguous axial images were obtained from the base of the skull through the vertex without intravenous contrast. RADIATION DOSE REDUCTION: This exam was performed according to the departmental dose-optimization program which includes automated exposure control, adjustment of the mA and/or kV according to patient size and/or use of iterative reconstruction technique. COMPARISON:  01/28/2023 FINDINGS: Brain: There is no acute hemorrhage. There are multiple old infarcts of the right temporal and occipital lobes. There is hypoattenuation of the periventricular white matter, most commonly indicating chronic ischemic microangiopathy. Right middle crania meningioma is unchanged. Vascular: Atherosclerotic calcification of the internal carotid arteries at the skull base. No abnormal hyperdensity of the major intracranial arteries or dural venous sinuses. Skull: The visualized skull base, calvarium and extracranial soft tissues are normal. Sinuses/Orbits: No fluid levels or advanced mucosal thickening of the visualized paranasal sinuses. No mastoid or  middle ear effusion. The orbits are normal. ASPECTS [facility] Stroke Program Early CT Score) - Ganglionic level infarction (caudate, lentiform nuclei, internal capsule, insula, M1-M3 cortex): 7 - Supraganglionic infarction (M4-M6 cortex): 3 Total score (0-10 with 10 being normal): 10 IMPRESSION: 1. No acute intracranial hemorrhage. 2. ASPECTS is 10. 3. 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 These results were called by telephone at the time of interpretation on 01/28/2023 at 2:32 am to provider [facility] , who verbally acknowledged these results. Electronically Signed   By: Franky Stanford M.D.   On: 01/28/2023 02:33   DG Chest Port 1 View Result Date: 01/22/2023 CLINICAL DATA:  Cough and hypoxia. EXAM: PORTABLE CHEST 1 VIEW COMPARISON:  January 14, 2023 FINDINGS: There is stable mild to moderate severity enlargement of the cardiac silhouette. Low lung volumes are noted with mild prominence of the central pulmonary vasculature. Mild atelectatic changes are suspected within the right lung base. No pleural effusion or pneumothorax is identified. Multilevel degenerative changes are noted throughout the thoracic spine. IMPRESSION:  1. Stable cardiomegaly and mild pulmonary vascular congestion. 2. Low lung volumes with mild right basilar atelectasis. Electronically Signed   By: Suzen Dials M.D.   On: 01/22/2023 22:26   CT ABDOMEN PELVIS WO CONTRAST Result Date: 01/21/2023 CLINICAL DATA:  68 year old male with abdominal pain starting last night. Query bowel obstruction. EXAM: CT ABDOMEN AND PELVIS WITHOUT CONTRAST TECHNIQUE: Multidetector CT imaging of the abdomen and pelvis was performed following the standard protocol without IV contrast. RADIATION DOSE REDUCTION: This exam was performed according to the departmental dose-optimization program which includes automated exposure control, adjustment of the mA and/or kV according to patient size and/or use of iterative reconstruction  technique. COMPARISON:  CT Chest, Abdomen, and Pelvis today 12/18/2022. FINDINGS: Lower chest: Stable cardiomegaly. No pericardial effusion. Right coronary artery atherosclerosis and/or stents redemonstrated. No pleural effusions. Mildly lower lung volumes from last month, mildly increased bilateral infrahilar pulmonary atelectasis and gas trapping. Small calcified right middle lobe granuloma redemonstrated. Hepatobiliary: Somewhat nodular liver contour. Chronic cholelithiasis. Stable liver and gallbladder. Pancreas: Negative noncontrast appearance. Spleen: Negative. Adrenals/Urinary Tract: Chronic renal vascular calcifications. Stable noncontrast adrenal glands, kidneys, bladder (less distended now). No hydronephrosis or hydroureter. Stomach/Bowel: Intermittent respiratory motion limiting bowel detail initially. Repeat images at 0543 hours with better image quality. Average large bowel retained stool from the splenic flexure distally. Fluid in the proximal colon through the mid transverse. No dilated large bowel. No large bowel wall thickening is evident. Normal appendix is visible on coronal image 64. Fluid containing but nondilated small bowel loops throughout the abdomen and pelvis. No free air or free fluid identified. No ventral bowel hernia. Small volume of gas and fluid in the stomach. No discrete mesenteric stranding is evident. Vascular/Lymphatic: Aortoiliac calcified atherosclerosis. Stable mild tortuosity of the abdominal aorta, Common iliac arteries. Vascular patency is not evaluated in the absence of IV contrast. No lymphadenopathy. Reproductive: Negative. Other: No pelvis free fluid. Musculoskeletal: Chronic anterior left 8th rib fracture again noted. Maintained lower vertebral height. Advanced lumbosacral junction degeneration. No acute osseous abnormality identified. IMPRESSION: 1. No evidence of mechanical bowel obstruction. But positive for fluid throughout nondilated small and the proximal large  bowel suggesting Enteritis, Diarrhea. No free air or free fluid. Normal appendix. 2. No other acute or inflammatory process identified in the noncontrast abdomen or pelvis. 3. Chronic cardiomegaly, coronary artery atherosclerosis, cholelithiasis, Aortic Atherosclerosis (ICD10-I70.0). Electronically Signed   By: VEAR Hurst M.D.   On: 01/21/2023 06:05    Microbiology: Results for orders placed or performed during the hospital encounter of 01/28/23  Culture, blood (Routine X 2) w Reflex to ID Panel     Status: None   Collection Time: 01/28/23  9:39 AM   Specimen: BLOOD  Result Value Ref Range Status   Specimen Description BLOOD BLOOD RIGHT ARM  Final   Special Requests   Final    BOTTLES DRAWN AEROBIC AND ANAEROBIC Blood Culture adequate volume   Culture   Final    NO GROWTH 5 DAYS Performed at [facility], [address] [place], [place] [address]    Report Status 02/02/2023 FINAL  Final  Culture, blood (Routine X 2) w Reflex to ID Panel     Status: None   Collection Time: 01/28/23  9:46 AM   Specimen: BLOOD  Result Value Ref Range Status   Specimen Description BLOOD LEFT ANTECUBITAL  Final   Special Requests   Final    BOTTLES DRAWN AEROBIC AND ANAEROBIC Blood Culture adequate volume   Culture  Final    NO GROWTH 5 DAYS Performed at [facility], [address] [place], [place] [address]    Report Status 02/02/2023 FINAL  Final  MRSA Next Gen by PCR, Nasal     Status: Abnormal   Collection Time: 01/28/23 10:42 AM   Specimen: Nasal Mucosa; Nasal Swab  Result Value Ref Range Status   MRSA by PCR Next Gen DETECTED (A) NOT DETECTED Final    Comment: RESULT CALLED TO, READ BACK BY AND VERIFIED WITH: IRVING,L ON 01/28/23 AT 2125 BY PURDIE,J        The GeneXpert MRSA Assay (FDA approved for NASAL specimens only), is one component of a comprehensive MRSA colonization surveillance program. It is not intended to diagnose MRSA infection nor to guide or monitor treatment for MRSA  infections. Performed at [facility], [address], [place], [place] [address]     Labs: CBC: Recent Labs  Lab 02/08/23 0425 02/11/23 0439  WBC 25.8* 25.8*  HGB 9.6* 8.4*  HCT 35.8* 32.7*  MCV 77.5* 80.0  PLT 269 206   Basic Metabolic Panel: Recent Labs  Lab 02/08/23 0425 02/11/23 0439 02/12/23 1023  NA 149* 146* 143  K 3.9 4.4 4.9  CL 112* 111 111  CO2 29 27 23   GLUCOSE 176* 278* 359*  BUN 57* 66* 59*  CREATININE 1.25* 1.31* 1.21  CALCIUM  8.7* 8.8* 9.1  PHOS 2.7  --  3.1   Liver Function Tests: Recent Labs  Lab 02/08/23 0425 02/12/23 1023  ALBUMIN  2.8* 2.7*   CBG: Recent Labs  Lab 02/13/23 1104 02/13/23 1609 02/13/23 2103 02/14/23 0813 02/14/23 1156  GLUCAP 440* 438* 288* 394* 409*    Discharge time spent: greater than 30 minutes.  Signed: Eric Nunnery, MD [facility] 02/14/2023

## 2023-02-14 NOTE — Inpatient Diabetes Management (Signed)
Inpatient Diabetes Program Recommendations  AACE/ADA: New Consensus Statement on Inpatient Glycemic Control  Target Ranges:  Prepandial:   less than 140 mg/dL      Peak postprandial:   less than 180 mg/dL (1-2 hours)      Critically ill patients:  140 - 180 mg/dL    Latest Reference Range & Units 02/13/23 07:34 02/13/23 11:04 02/13/23 16:09 02/13/23 21:03 02/14/23 08:13  Glucose-Capillary 70 - 99 mg/dL 161 (H) 096 (H) 045 (H) 288 (H) 394 (H)  (H): Data is abnormally high  Review of Glycemic Control Diabetes history: DM2 Outpatient Diabetes medications: Lantus 5 units at bedtime, Jardiance 10 mg daily Current orders for Inpatient glycemic control: Semglee 12 units BID, Novolog 0-20 units Q6H, Novolog 3 units Q8H; Decadron 1 mg Q12H, Osmolite @ 60 ml/hr   Inpatient Diabetes Program Recommendations:     Insulin: Please consider changing frequency of Novolog 0-20 units to Q4H, changing Novolog tube feeding coverage to Novolog 7 units Q4H (change frequency so CBGs, Novolog correction, and Novolog tube feeding timed together consistently). If steroids are continued as ordered, please consider increasing Semglee to 17 units BID as well.  Thanks, Orlando Penner, RN, MSN, CDCES Diabetes Coordinator Inpatient Diabetes Program [phone] (Team Pager from 8am to 5pm)

## 2023-02-14 NOTE — Consult Note (Signed)
 [facility] Liaison Consult Note    02/14/2023  [name] 1955-09-18 [id number]  Primary Care Provider:  Benita Outhouse, MD [place])  Patient is currently active with Care Management for chronic disease management services.  Patient has been engaged by a corporate investment banker.  Our community based plan of care has focused on disease management and community resource support.   Liaison will collaborate with [facility] team of pt's discharged disposition to transition to LTAC when medically stable.  Plan: Liaison will update the involved VBCI team members on pt's discharged disposition for LTAC. And request case closed due to his level of care for LTAC placement.  Inpatient Transition Of Care [TOC] team member to make aware that Care Management following.  Of note, Care Management services does not replace or interfere with any services that are needed or arranged by inpatient [facility] care management team.   For additional questions or referrals please contact:  Olam Ku, RN, [facility] Liaison [place]   [facility], Population Health Office Hours MTWF  8:00 am-6:00 pm Direct Dial: [phone] mobile [phone] [Office toll free line] Office Hours are M-F 8:30 - 5 pm [email or web address]

## 2023-02-15 NOTE — Patient Instructions (Signed)
 Visit Information  Thank you for taking time to visit with me today. Please don't hesitate to contact me if I can be of assistance to you.   Following are the goals we discussed today:   Goals Addressed               This Visit's Progress     Find Help in My Community. (pt-stated)   On track     Care Coordination Interventions:  Interventions Today    Flowsheet Row Most Recent Value  Chronic Disease   Chronic disease during today's visit Diabetes, Chronic Obstructive Pulmonary Disease (COPD), Congestive Heart Failure (CHF), Hypertension (HTN), Other, Chronic Kidney Disease/End Stage Renal Disease (ESRD)  [Meningioma w/ Encephalopathy & Cerebral Edema, Cocaine Abuse, Tobacco Abuse, Inability to Perform Activities of Daily Living Independently, Caregiver Burnout/Stress/Fatigue, Chronic Pain, Pleural Effusion, Recent Stroke, Transferring to [facility].]  General Interventions   General Interventions Discussed/Reviewed General Interventions Discussed, General Interventions Reviewed, [employer], Communication with, Doctor Visits, Level of Care, Sick Day Rules  [Encouraged Routine Engagement with Care Team Members & Providers.]  Doctor Visits Discussed/Reviewed Doctor Visits Discussed, Specialist, Doctor Visits Reviewed, Annual Wellness Visits, PCP  [Encouraged Routine Engagement with Care Team Members & Providers.]  PCP/Specialist Visits Compliance with follow-up visit  [Encouraged Routine Engagement with Care Team Members & Providers.]  Communication with PCP/Specialists, RN, Pharmacists, Social Work  [employer] Routine Engagement with Care Team Members & Providers.]  Level of Care --  Arts Administrator from [facility] to [facility].]  Exercise Interventions   Exercise Discussed/Reviewed Exercise Discussed, Exercise Reviewed, Physical Activity, Assistive device use and maintanence, Weight Managment  [Patient Encouraged Routine Use of Assistive Devices &  Durable Medical Equipment.]  Physical Activity Discussed/Reviewed Physical Activity Discussed, PREP, Physical Activity Reviewed, Gym, Types of exercise  [Patient Will Work with Physical & Occupational Therapists 5 Days Per Week at [facility].]  Education Interventions   Provided Engineer, [employer], [employer], When to see the doctor, Mental Health/Coping with Illness  [Encouraged Routine Engagement with Care Team Members & Providers.]  Mental Health Interventions   Mental Health Discussed/Reviewed Mental Health Discussed, Anxiety, Depression, Grief and Loss, Mental Health Reviewed, Substance Abuse, Coping Strategies, Suicide, Crisis, Other  [Assessed Mental Health & Cognitive Status.]      Active Listening & Reflection Utilized. Verbalization of Feelings Encouraged. Emotional Support Provided. CSW Collaboration with Daughter, Kellan Raffield to Encourage Routine Engagement with Castulo Scarpelli, Licensed Clinical Social Worker with [facility], [facility] [phone]), if She Has Questions, Needs Assistance, or If Additional Social Work Needs Are Identified Between Now & Our Next Follow-Up Outreach Call, Scheduled on 03/07/2023 at 11:15 AM.      Our next appointment is by telephone on 03/07/2023 at 11:15 am.  Please call the care guide team at [phone] if you need to cancel or reschedule your appointment.   If you are experiencing a Mental Health or Behavioral Health Crisis or need someone to talk to, please call the Suicide and Crisis Lifeline: 988 call the [place]  National Suicide Prevention Lifeline: (415) 680-7440 or TTY: (747)382-2283 TTY (418)057-1445) to talk to a trained counselor call 1-800-273-TALK (toll free, 24 hour hotline) go to [facility] [address] [place] [phone]) call the [facility] Crisis Line: [phone] call 911  Patient verbalizes understanding  of instructions and care plan provided today and agrees to view in MyChart. Active MyChart status and patient understanding of how to  access instructions and care plan via MyChart confirmed with patient.     Telephone follow up appointment with care management team member scheduled for:  03/07/2023 at 11:15 am.   Philippe Desanctis, BSW, MSW, LCSW [place]  [facility], [facility] Clinical Social Worker II Direct Dial: [phone]  Fax: [phone] Website: [email or web address]

## 2023-02-15 NOTE — Progress Notes (Addendum)
 Transport on the floor to transfer patient to [facility]. Ivl removed. Purewick removed. Patient transported with his belongings.

## 2023-02-15 NOTE — Patient Outreach (Signed)
 Care Coordination   Follow Up Visit Note   02/15/2023  Name: [name] MRN: [id number] DOB: 1955/04/23  [name] is a 68 y.o. year old male who sees [name], Tesfaye Demissie, MD for primary care. I spoke with patient's daughter, Manasseh Pittsley by phone today.  What matters to the patients health and wellness today?  Find Help in My Community.    Goals Addressed               This Visit's Progress     Find Help in My Community. (pt-stated)   On track     Care Coordination Interventions:  Interventions Today    Flowsheet Row Most Recent Value  Chronic Disease   Chronic disease during today's visit Diabetes, Chronic Obstructive Pulmonary Disease (COPD), Congestive Heart Failure (CHF), Hypertension (HTN), Other, Chronic Kidney Disease/End Stage Renal Disease (ESRD)  [Meningioma w/ Encephalopathy & Cerebral Edema, Cocaine Abuse, Tobacco Abuse, Inability to Perform Activities of Daily Living Independently, Caregiver Burnout/Stress/Fatigue, Chronic Pain, Pleural Effusion, Recent Stroke, Transferring to [facility].]  General Interventions   General Interventions Discussed/Reviewed General Interventions Discussed, General Interventions Reviewed, [employer], Communication with, Doctor Visits, Level of Care, Sick Day Rules  [Encouraged Routine Engagement with Care Team Members & Providers.]  Doctor Visits Discussed/Reviewed Doctor Visits Discussed, Specialist, Doctor Visits Reviewed, Annual Wellness Visits, PCP  [Encouraged Routine Engagement with Care Team Members & Providers.]  PCP/Specialist Visits Compliance with follow-up visit  [Encouraged Routine Engagement with Care Team Members & Providers.]  Communication with PCP/Specialists, RN, Pharmacists, Social Work  [employer] Routine Engagement with Care Team Members & Providers.]  Level of Care --  Arts Administrator from [facility].]  Exercise Interventions   Exercise  Discussed/Reviewed Exercise Discussed, Exercise Reviewed, Physical Activity, Assistive device use and maintanence, Weight Managment  [Patient Encouraged Routine Use of Assistive Devices & Durable Medical Equipment.]  Physical Activity Discussed/Reviewed Physical Activity Discussed, PREP, Physical Activity Reviewed, Gym, Types of exercise  [Patient Will Work with Physical & Occupational Therapists 5 Days Per Week at [facility].]  Education Interventions   Provided Engineer, [employer], [employer], When to see the doctor, Mental Health/Coping with Illness  [Encouraged Routine Engagement with Care Team Members & Providers.]  Mental Health Interventions   Mental Health Discussed/Reviewed Mental Health Discussed, Anxiety, Depression, Grief and Loss, Mental Health Reviewed, Substance Abuse, Coping Strategies, Suicide, Crisis, Other  [Assessed Mental Health & Cognitive Status.]      Active Listening & Reflection Utilized. Verbalization of Feelings Encouraged. Emotional Support Provided. CSW Collaboration with Daughter, Tejon Gracie to Encourage Routine Engagement with Silvia Markuson, Licensed Clinical Social Worker with [facility], [facility] [phone]), if She Has Questions, Needs Assistance, or If Additional Social Work Needs Are Identified Between Now & Our Next Follow-Up Outreach Call, Scheduled on 03/07/2023 at 11:15 AM.        SDOH assessments and interventions completed:  Yes.  SDOH Interventions Today    Flowsheet Row Most Recent Value  SDOH Interventions   Food Insecurity Interventions Intervention Not Indicated  Housing Interventions Intervention Not Indicated  Transportation Interventions Intervention Not Indicated, Patient Resources (Friends/Family)  Utilities Interventions Intervention Not Indicated  Alcohol  Usage Interventions Intervention Not Indicated (Score <7)  Financial Strain Interventions Intervention  Not Indicated  Physical Activity Interventions Intervention Not Indicated  Stress Interventions Intervention Not Indicated  Social Connections Interventions Intervention Not Indicated  Health Literacy Interventions Intervention Not Indicated  Care Coordination Interventions:  Yes, provided.   Follow up plan: Follow up call scheduled for 03/07/2023 at 11:15 am.  Encounter Outcome:  Patient Visit Completed.   Philippe Desanctis, BSW, MSW, LCSW [facility] Clinical Social Worker II Direct Dial: [phone]  Fax: [phone] Website: [email or web address]

## 2023-02-18 DIAGNOSIS — J189 Pneumonia, unspecified organism: Secondary | ICD-10-CM | POA: Diagnosis not present

## 2023-02-18 DIAGNOSIS — J810 Acute pulmonary edema: Secondary | ICD-10-CM | POA: Diagnosis not present

## 2023-02-19 DIAGNOSIS — J810 Acute pulmonary edema: Secondary | ICD-10-CM | POA: Diagnosis not present

## 2023-02-19 DIAGNOSIS — J189 Pneumonia, unspecified organism: Secondary | ICD-10-CM | POA: Diagnosis not present

## 2023-02-21 DIAGNOSIS — R079 Chest pain, unspecified: Secondary | ICD-10-CM

## 2023-02-23 ENCOUNTER — Ambulatory Visit (INDEPENDENT_AMBULATORY_CARE_PROVIDER_SITE_OTHER): Payer: No Typology Code available for payment source | Admitting: Gastroenterology

## 2023-02-26 ENCOUNTER — Emergency Department (HOSPITAL_COMMUNITY): Payer: No Typology Code available for payment source

## 2023-02-26 ENCOUNTER — Inpatient Hospital Stay (HOSPITAL_COMMUNITY)
Admission: EM | Admit: 2023-02-26 | Discharge: 2023-03-11 | DRG: 871 | Disposition: E | Payer: No Typology Code available for payment source | Source: Skilled Nursing Facility | Attending: Pulmonary Disease | Admitting: Pulmonary Disease

## 2023-02-26 DIAGNOSIS — A419 Sepsis, unspecified organism: Secondary | ICD-10-CM

## 2023-02-26 DIAGNOSIS — I5042 Chronic combined systolic (congestive) and diastolic (congestive) heart failure: Secondary | ICD-10-CM | POA: Diagnosis present

## 2023-02-26 DIAGNOSIS — B9620 Unspecified Escherichia coli [E. coli] as the cause of diseases classified elsewhere: Secondary | ICD-10-CM

## 2023-02-26 DIAGNOSIS — I69354 Hemiplegia and hemiparesis following cerebral infarction affecting left non-dominant side: Secondary | ICD-10-CM

## 2023-02-26 DIAGNOSIS — R296 Repeated falls: Secondary | ICD-10-CM | POA: Diagnosis present

## 2023-02-26 DIAGNOSIS — Z79899 Other long term (current) drug therapy: Secondary | ICD-10-CM

## 2023-02-26 DIAGNOSIS — I358 Other nonrheumatic aortic valve disorders: Secondary | ICD-10-CM | POA: Diagnosis not present

## 2023-02-26 DIAGNOSIS — E875 Hyperkalemia: Secondary | ICD-10-CM | POA: Diagnosis present

## 2023-02-26 DIAGNOSIS — E785 Hyperlipidemia, unspecified: Secondary | ICD-10-CM | POA: Diagnosis present

## 2023-02-26 DIAGNOSIS — J15211 Pneumonia due to Methicillin susceptible Staphylococcus aureus: Secondary | ICD-10-CM | POA: Diagnosis present

## 2023-02-26 DIAGNOSIS — E8809 Other disorders of plasma-protein metabolism, not elsewhere classified: Secondary | ICD-10-CM | POA: Diagnosis present

## 2023-02-26 DIAGNOSIS — E872 Acidosis, unspecified: Secondary | ICD-10-CM | POA: Diagnosis present

## 2023-02-26 DIAGNOSIS — J15212 Pneumonia due to Methicillin resistant Staphylococcus aureus: Secondary | ICD-10-CM | POA: Diagnosis present

## 2023-02-26 DIAGNOSIS — Z8249 Family history of ischemic heart disease and other diseases of the circulatory system: Secondary | ICD-10-CM

## 2023-02-26 DIAGNOSIS — A4102 Sepsis due to Methicillin resistant Staphylococcus aureus: Secondary | ICD-10-CM | POA: Diagnosis present

## 2023-02-26 DIAGNOSIS — R748 Abnormal levels of other serum enzymes: Secondary | ICD-10-CM | POA: Diagnosis present

## 2023-02-26 DIAGNOSIS — E1121 Type 2 diabetes mellitus with diabetic nephropathy: Secondary | ICD-10-CM | POA: Diagnosis present

## 2023-02-26 DIAGNOSIS — K559 Vascular disorder of intestine, unspecified: Secondary | ICD-10-CM | POA: Diagnosis present

## 2023-02-26 DIAGNOSIS — Z515 Encounter for palliative care: Secondary | ICD-10-CM | POA: Diagnosis not present

## 2023-02-26 DIAGNOSIS — R7881 Bacteremia: Secondary | ICD-10-CM

## 2023-02-26 DIAGNOSIS — B49 Unspecified mycosis: Secondary | ICD-10-CM

## 2023-02-26 DIAGNOSIS — G8929 Other chronic pain: Secondary | ICD-10-CM | POA: Diagnosis present

## 2023-02-26 DIAGNOSIS — E1122 Type 2 diabetes mellitus with diabetic chronic kidney disease: Secondary | ICD-10-CM | POA: Diagnosis present

## 2023-02-26 DIAGNOSIS — R571 Hypovolemic shock: Secondary | ICD-10-CM | POA: Diagnosis not present

## 2023-02-26 DIAGNOSIS — E1165 Type 2 diabetes mellitus with hyperglycemia: Secondary | ICD-10-CM | POA: Diagnosis present

## 2023-02-26 DIAGNOSIS — Z6831 Body mass index (BMI) 31.0-31.9, adult: Secondary | ICD-10-CM | POA: Diagnosis not present

## 2023-02-26 DIAGNOSIS — G9341 Metabolic encephalopathy: Secondary | ICD-10-CM | POA: Diagnosis present

## 2023-02-26 DIAGNOSIS — K921 Melena: Secondary | ICD-10-CM | POA: Diagnosis not present

## 2023-02-26 DIAGNOSIS — Z66 Do not resuscitate: Secondary | ICD-10-CM | POA: Diagnosis present

## 2023-02-26 DIAGNOSIS — D6959 Other secondary thrombocytopenia: Secondary | ICD-10-CM | POA: Diagnosis not present

## 2023-02-26 DIAGNOSIS — R7401 Elevation of levels of liver transaminase levels: Secondary | ICD-10-CM | POA: Diagnosis present

## 2023-02-26 DIAGNOSIS — R0902 Hypoxemia: Secondary | ICD-10-CM | POA: Diagnosis not present

## 2023-02-26 DIAGNOSIS — J155 Pneumonia due to Escherichia coli: Secondary | ICD-10-CM | POA: Diagnosis not present

## 2023-02-26 DIAGNOSIS — Z7401 Bed confinement status: Secondary | ICD-10-CM

## 2023-02-26 DIAGNOSIS — R1310 Dysphagia, unspecified: Secondary | ICD-10-CM | POA: Diagnosis present

## 2023-02-26 DIAGNOSIS — Z95820 Peripheral vascular angioplasty status with implants and grafts: Secondary | ICD-10-CM | POA: Diagnosis not present

## 2023-02-26 DIAGNOSIS — K8020 Calculus of gallbladder without cholecystitis without obstruction: Secondary | ICD-10-CM | POA: Diagnosis present

## 2023-02-26 DIAGNOSIS — I5020 Unspecified systolic (congestive) heart failure: Secondary | ICD-10-CM | POA: Insufficient documentation

## 2023-02-26 DIAGNOSIS — Z9181 History of falling: Secondary | ICD-10-CM

## 2023-02-26 DIAGNOSIS — R310 Gross hematuria: Secondary | ICD-10-CM | POA: Diagnosis present

## 2023-02-26 DIAGNOSIS — Z7952 Long term (current) use of systemic steroids: Secondary | ICD-10-CM

## 2023-02-26 DIAGNOSIS — N183 Chronic kidney disease, stage 3 unspecified: Secondary | ICD-10-CM | POA: Diagnosis present

## 2023-02-26 DIAGNOSIS — Z931 Gastrostomy status: Secondary | ICD-10-CM

## 2023-02-26 DIAGNOSIS — R040 Epistaxis: Secondary | ICD-10-CM | POA: Diagnosis present

## 2023-02-26 DIAGNOSIS — Z8711 Personal history of peptic ulcer disease: Secondary | ICD-10-CM

## 2023-02-26 DIAGNOSIS — Z860101 Personal history of adenomatous and serrated colon polyps: Secondary | ICD-10-CM

## 2023-02-26 DIAGNOSIS — Z8701 Personal history of pneumonia (recurrent): Secondary | ICD-10-CM

## 2023-02-26 DIAGNOSIS — E11649 Type 2 diabetes mellitus with hypoglycemia without coma: Secondary | ICD-10-CM | POA: Diagnosis not present

## 2023-02-26 DIAGNOSIS — G4733 Obstructive sleep apnea (adult) (pediatric): Secondary | ICD-10-CM | POA: Diagnosis present

## 2023-02-26 DIAGNOSIS — Z7982 Long term (current) use of aspirin: Secondary | ICD-10-CM

## 2023-02-26 DIAGNOSIS — Z1152 Encounter for screening for COVID-19: Secondary | ICD-10-CM | POA: Diagnosis not present

## 2023-02-26 DIAGNOSIS — E440 Moderate protein-calorie malnutrition: Secondary | ICD-10-CM | POA: Diagnosis present

## 2023-02-26 DIAGNOSIS — F03C4 Unspecified dementia, severe, with anxiety: Secondary | ICD-10-CM | POA: Diagnosis present

## 2023-02-26 DIAGNOSIS — I69320 Aphasia following cerebral infarction: Secondary | ICD-10-CM

## 2023-02-26 DIAGNOSIS — N390 Urinary tract infection, site not specified: Secondary | ICD-10-CM | POA: Diagnosis present

## 2023-02-26 DIAGNOSIS — G928 Other toxic encephalopathy: Secondary | ICD-10-CM | POA: Diagnosis not present

## 2023-02-26 DIAGNOSIS — Z888 Allergy status to other drugs, medicaments and biological substances status: Secondary | ICD-10-CM

## 2023-02-26 DIAGNOSIS — I69391 Dysphagia following cerebral infarction: Secondary | ICD-10-CM

## 2023-02-26 DIAGNOSIS — A0811 Acute gastroenteropathy due to Norwalk agent: Secondary | ICD-10-CM | POA: Diagnosis present

## 2023-02-26 DIAGNOSIS — E8721 Acute metabolic acidosis: Secondary | ICD-10-CM

## 2023-02-26 DIAGNOSIS — Z955 Presence of coronary angioplasty implant and graft: Secondary | ICD-10-CM

## 2023-02-26 DIAGNOSIS — R6521 Severe sepsis with septic shock: Secondary | ICD-10-CM | POA: Diagnosis not present

## 2023-02-26 DIAGNOSIS — Z833 Family history of diabetes mellitus: Secondary | ICD-10-CM

## 2023-02-26 DIAGNOSIS — I130 Hypertensive heart and chronic kidney disease with heart failure and stage 1 through stage 4 chronic kidney disease, or unspecified chronic kidney disease: Secondary | ICD-10-CM | POA: Diagnosis present

## 2023-02-26 DIAGNOSIS — L89152 Pressure ulcer of sacral region, stage 2: Secondary | ICD-10-CM | POA: Diagnosis present

## 2023-02-26 DIAGNOSIS — I252 Old myocardial infarction: Secondary | ICD-10-CM

## 2023-02-26 DIAGNOSIS — R578 Other shock: Secondary | ICD-10-CM | POA: Diagnosis present

## 2023-02-26 DIAGNOSIS — I2510 Atherosclerotic heart disease of native coronary artery without angina pectoris: Secondary | ICD-10-CM | POA: Diagnosis present

## 2023-02-26 DIAGNOSIS — K219 Gastro-esophageal reflux disease without esophagitis: Secondary | ICD-10-CM | POA: Diagnosis present

## 2023-02-26 DIAGNOSIS — D62 Acute posthemorrhagic anemia: Secondary | ICD-10-CM | POA: Diagnosis not present

## 2023-02-26 DIAGNOSIS — Z794 Long term (current) use of insulin: Secondary | ICD-10-CM

## 2023-02-26 DIAGNOSIS — F17210 Nicotine dependence, cigarettes, uncomplicated: Secondary | ICD-10-CM | POA: Diagnosis present

## 2023-02-26 DIAGNOSIS — R579 Shock, unspecified: Secondary | ICD-10-CM

## 2023-02-26 DIAGNOSIS — N179 Acute kidney failure, unspecified: Secondary | ICD-10-CM | POA: Diagnosis present

## 2023-02-26 DIAGNOSIS — D329 Benign neoplasm of meninges, unspecified: Secondary | ICD-10-CM | POA: Diagnosis present

## 2023-02-26 DIAGNOSIS — Z789 Other specified health status: Secondary | ICD-10-CM

## 2023-02-26 DIAGNOSIS — E46 Unspecified protein-calorie malnutrition: Secondary | ICD-10-CM

## 2023-02-26 DIAGNOSIS — B377 Candidal sepsis: Secondary | ICD-10-CM | POA: Diagnosis present

## 2023-02-26 DIAGNOSIS — I255 Ischemic cardiomyopathy: Secondary | ICD-10-CM | POA: Diagnosis present

## 2023-02-26 DIAGNOSIS — Z6832 Body mass index (BMI) 32.0-32.9, adult: Secondary | ICD-10-CM

## 2023-02-26 LAB — URINALYSIS, W/ REFLEX TO CULTURE (INFECTION SUSPECTED)
Bilirubin Urine: NEGATIVE
Glucose, UA: NEGATIVE mg/dL
Ketones, ur: NEGATIVE mg/dL
Nitrite: NEGATIVE
Protein, ur: 100 mg/dL — AB
RBC / HPF: >50 RBC/hpf (ref 0–5)
Specific Gravity, Urine: 1.014 (ref 1.005–1.030)
pH: 5 (ref 5.0–8.0)

## 2023-02-26 LAB — COMPREHENSIVE METABOLIC PANEL
ALT: 96 U/L — ABNORMAL HIGH (ref 0–44)
AST: 82 U/L — ABNORMAL HIGH (ref 15–41)
Albumin: <1.5 g/dL — ABNORMAL LOW (ref 3.5–5.0)
Alkaline Phosphatase: 325 U/L — ABNORMAL HIGH (ref 38–126)
Anion gap: 12 (ref 5–15)
BUN: 104 mg/dL — ABNORMAL HIGH (ref 8–23)
CO2: 16 mmol/L — ABNORMAL LOW (ref 22–32)
Calcium: 8.1 mg/dL — ABNORMAL LOW (ref 8.9–10.3)
Chloride: 108 mmol/L (ref 98–111)
Creatinine, Ser: 2.74 mg/dL — ABNORMAL HIGH (ref 0.61–1.24)
GFR, Estimated: 24 mL/min — ABNORMAL LOW (ref >60)
Glucose, Bld: 185 mg/dL — ABNORMAL HIGH (ref 70–99)
Potassium: 5.3 mmol/L — ABNORMAL HIGH (ref 3.5–5.1)
Sodium: 136 mmol/L (ref 135–145)
Total Bilirubin: 1 mg/dL (ref 0.0–1.2)
Total Protein: 5.2 g/dL — ABNORMAL LOW (ref 6.5–8.1)

## 2023-02-26 LAB — RESP PANEL BY RT-PCR (RSV, FLU A&B, COVID)  RVPGX2
Influenza A by PCR: NEGATIVE
Influenza B by PCR: NEGATIVE
Resp Syncytial Virus by PCR: NEGATIVE
SARS Coronavirus 2 by RT PCR: NEGATIVE

## 2023-02-26 LAB — PROTIME-INR
INR: 1.3 — ABNORMAL HIGH (ref 0.8–1.2)
Prothrombin Time: 16.7 s — ABNORMAL HIGH (ref 11.4–15.2)

## 2023-02-26 LAB — CBC WITH DIFFERENTIAL/PLATELET
Abs Immature Granulocytes: 0.17 10*3/uL — ABNORMAL HIGH (ref 0.00–0.07)
Basophils Absolute: 0 10*3/uL (ref 0.0–0.1)
Basophils Relative: 0 %
Eosinophils Absolute: 0 10*3/uL (ref 0.0–0.5)
Eosinophils Relative: 0 %
HCT: 18.4 % — ABNORMAL LOW (ref 39.0–52.0)
Hemoglobin: 5.4 g/dL — CL (ref 13.0–17.0)
Immature Granulocytes: 1 %
Lymphocytes Relative: 1 %
Lymphs Abs: 0.2 10*3/uL — ABNORMAL LOW (ref 0.7–4.0)
MCH: 23.8 pg — ABNORMAL LOW (ref 26.0–34.0)
MCHC: 29.3 g/dL — ABNORMAL LOW (ref 30.0–36.0)
MCV: 81.1 fL (ref 80.0–100.0)
Monocytes Absolute: 0.3 10*3/uL (ref 0.1–1.0)
Monocytes Relative: 2 %
Neutro Abs: 17.8 10*3/uL — ABNORMAL HIGH (ref 1.7–7.7)
Neutrophils Relative %: 96 %
Platelets: 136 10*3/uL — ABNORMAL LOW (ref 150–400)
RBC: 2.27 MIL/uL — ABNORMAL LOW (ref 4.22–5.81)
RDW: 25.8 % — ABNORMAL HIGH (ref 11.5–15.5)
WBC: 18.5 10*3/uL — ABNORMAL HIGH (ref 4.0–10.5)
nRBC: 0.3 % — ABNORMAL HIGH (ref 0.0–0.2)

## 2023-02-26 LAB — I-STAT CHEM 8, ED
BUN: 104 mg/dL — ABNORMAL HIGH (ref 8–23)
Calcium, Ion: 1.18 mmol/L (ref 1.15–1.40)
Chloride: 108 mmol/L (ref 98–111)
Creatinine, Ser: 3.2 mg/dL — ABNORMAL HIGH (ref 0.61–1.24)
Glucose, Bld: 177 mg/dL — ABNORMAL HIGH (ref 70–99)
HCT: 18 % — ABNORMAL LOW (ref 39.0–52.0)
Hemoglobin: 6.1 g/dL — CL (ref 13.0–17.0)
Potassium: 5.3 mmol/L — ABNORMAL HIGH (ref 3.5–5.1)
Sodium: 135 mmol/L (ref 135–145)
TCO2: 16 mmol/L — ABNORMAL LOW (ref 22–32)

## 2023-02-26 LAB — APTT: aPTT: 40 s — ABNORMAL HIGH (ref 24–36)

## 2023-02-26 LAB — I-STAT CG4 LACTIC ACID, ED: Lactic Acid, Venous: 3.8 mmol/L (ref 0.5–1.9)

## 2023-02-26 LAB — PREPARE RBC (CROSSMATCH)

## 2023-02-26 MED ORDER — SODIUM CHLORIDE 0.9% IV SOLUTION: Freq: Once | INTRAVENOUS | Status: AC

## 2023-02-26 MED ORDER — PANTOPRAZOLE SODIUM 40 MG IV SOLR
40.0000 mg | Freq: Three times a day (TID) | INTRAVENOUS | Status: AC
  Administered 2023-02-27 – 2023-03-01 (×9): 40 mg via INTRAVENOUS
  Filled 2023-02-26 (×9): qty 10

## 2023-02-26 MED ORDER — SODIUM CHLORIDE 0.9 % IV SOLN
250.0000 mL | INTRAVENOUS | Status: AC
Start: 2023-02-27 — End: 2023-02-27
  Administered 2023-02-27: 250 mL via INTRAVENOUS

## 2023-02-26 MED ORDER — NOREPINEPHRINE 4 MG/250ML-% IV SOLN
2.0000 ug/min | INTRAVENOUS | Status: DC
  Administered 2023-02-27: 7 ug/min via INTRAVENOUS
  Administered 2023-02-27: 5 ug/min via INTRAVENOUS
  Administered 2023-02-27: 2 ug/min via INTRAVENOUS
  Administered 2023-03-01: 5 ug/min via INTRAVENOUS
  Administered 2023-03-02: 8 ug/min via INTRAVENOUS
  Administered 2023-03-02: 6 ug/min via INTRAVENOUS
  Filled 2023-02-26 (×7): qty 250

## 2023-02-26 MED ORDER — SODIUM CHLORIDE 0.9 % IV BOLUS
1000.0000 mL | Freq: Once | INTRAVENOUS | Status: AC
  Administered 2023-02-26: 1000 mL via INTRAVENOUS

## 2023-02-26 MED ORDER — PANTOPRAZOLE SODIUM 40 MG IV SOLR
40.0000 mg | Freq: Two times a day (BID) | INTRAVENOUS | Status: DC
  Administered 2023-03-02 (×2): 40 mg via INTRAVENOUS
  Filled 2023-02-26 (×3): qty 10

## 2023-02-26 MED ORDER — PIPERACILLIN-TAZOBACTAM 3.375 G IVPB 30 MIN
3.3750 g | Freq: Once | INTRAVENOUS | Status: AC
  Administered 2023-02-26: 3.375 g via INTRAVENOUS
  Filled 2023-02-26: qty 50

## 2023-02-26 MED ORDER — VANCOMYCIN HCL 2000 MG/400ML IV SOLN
2000.0000 mg | Freq: Once | INTRAVENOUS | Status: AC
  Administered 2023-02-26: 2000 mg via INTRAVENOUS
  Filled 2023-02-26: qty 400

## 2023-02-26 MED ORDER — PANTOPRAZOLE SODIUM 40 MG IV SOLR
40.0000 mg | INTRAVENOUS | Status: AC
  Administered 2023-02-26: 40 mg via INTRAVENOUS

## 2023-02-26 NOTE — Progress Notes (Signed)
ED Pharmacy Antibiotic Sign Off An antibiotic consult was received from an ED provider for zosyn and vancomycin per pharmacy dosing for sepsis. A chart review was completed to assess appropriateness.   The following one time order(s) were placed:  Zosyn 3.375g Vancomycin 2g   Further antibiotic and/or antibiotic pharmacy consults should be ordered by the admitting provider if indicated.   Thank you for allowing pharmacy to be a part of this patient's care.   Marja Kays, [facility]  Clinical Pharmacist 02/26/23 11:04 PM

## 2023-02-26 NOTE — ED Triage Notes (Addendum)
Pt BIB [facility] from [facility] with reports hypotension and 6 bloody stools. EMS reports pt received 1LNS at [facility]. Ns29ml/hr, Protonix 80mg , and Levophed 59mcg/min initiated by EMS. Pt oriented to self only and on 6LNC (baseline).

## 2023-02-26 NOTE — H&P (Signed)
NAME:  [name], MRN:  [id number], DOB:  1955/12/16, LOS: 0 ADMISSION DATE:  02/26/2023, CONSULTATION DATE:  2331 REFERRING MD:  EDP, CHIEF COMPLAINT:  acute GIB   History of Present Illness:  [name] presented after being actively treated for sepsis (MSSA/Ecoli and bacteremia) and concomitantly having hypotension after a large melanotic stool earlier today. Per report from EDP, as pt is aphasic 2/2 CVA, [facility] (his [facility]) reported that pt was improving from a septic standpoint until today. Pt is on no chronic a/c but is on ASA per his peg tube. His facility reportedly started volume resuscitation while awaiting transport to ED. Pt had mild improvement in BP but has ultimately required initiation of vasopressors via piv. His hgb was found to be 6.1. EDP ordered 2 U PRBC at this time and is contacting GI for consultation.   All history is obtained from chart review as pt is unable to provide any 2/2 baseline status.   Ccm was asked to admit 2/2 need for vasopressors at this time.   Pertinent  Medical History  H/o cva with resultant aphasia, dysphagia and bedbound status Advanced dementia Htn Hyperlipidemia T2dm with hyperglycemia Ckd2 gerd   Significant Hospital Events: Including procedures, antibiotic start and stop dates in addition to other pertinent events   Admitted to ICU 2/16  Interim History / Subjective:    Objective   Blood pressure 99/61, pulse 96, temperature (!) 96.4 F (35.8 C), temperature source Core (Comment), resp. rate 20, SpO2 100%.       No intake or output data in the 24 hours ending 02/26/23 2327 There were no vitals filed for this visit.  Examination: General: chronically ill appearing male reclining in bed, moaning and incomprehensible sounds HENT: ncat, eomi, perrla, mm extremely dry and pale Lungs: ctab Cardiovascular: rrr Abdomen: soft nt/nd bs + Extremities: no c/c/e Neuro: not following commands. 2+/5 in RUE 4/5 in LUE, no  movement in BLE  GU: deferred  Resolved Hospital Problem list     Assessment & Plan:  Acute GIB Acute blood loss anemia on chronic anemia AKI on CKD2 H/o cva with chronic deficits Hypotension with shock 2/2 acute hemorrhage MSSA/ecoli bacteremia, subacute Hyperkalemia Metabolic acidosis with Lactic acidosis Dm2 with Hyperglycemia Chronic combined systolic/diastolic HF Elevated transaminases Elevated alk phos Protein malnutrition -transfuse 2U prbc -follow h&h frequently -awaiting GI reaching back out to EDP or Icu team -bid ppi -titrate vasopressors to MAP >65 -cont abx coverage for known infections with broadening for coverage for anaerobes in light of GI pathology -f/u cx -temporize hyperkalemia -trend lactate -cont volume resuscitation, currently with blood -ssi -NPO -consider repeat echo if unable to wean with correction of anemia and active gib -check ggt as well as trend lft's -consider RUQ u/s pending results or improvement of hypotension    Best Practice (right click and "Reselect all SmartList Selections" daily)   Diet/type: NPO DVT prophylaxis SCD Pressure ulcer(s): present on admission  see RN assessment GI prophylaxis: PPI Lines: N/A Foley:  Yes, and it is still needed Code Status:  DNR but ok with intubation, pressors etc Last date of multidisciplinary goals of care discussion [pending family discussion]  Labs   CBC: Recent Labs  Lab 02/26/23 2205 02/26/23 2219  WBC 18.5*  --   NEUTROABS 17.8*  --   HGB 5.4* 6.1*  HCT 18.4* 18.0*  MCV 81.1  --   PLT 136*  --     Basic Metabolic Panel: Recent Labs  Lab 02/26/23 2205 02/26/23 2219  NA 136 135  K 5.3* 5.3*  CL 108 108  CO2 16*  --   GLUCOSE 185* 177*  BUN 104* 104*  CREATININE 2.74* 3.20*  CALCIUM 8.1*  --    GFR: Estimated Creatinine Clearance: 24.4 mL/min (A) (by C-G formula based on SCr of 3.2 mg/dL (H)). Recent Labs  Lab 02/26/23 2205 02/26/23 2220  WBC 18.5*  --    LATICACIDVEN  --  3.8*    Liver Function Tests: Recent Labs  Lab 02/26/23 2205  AST 82*  ALT 96*  ALKPHOS 325*  BILITOT 1.0  PROT 5.2*  ALBUMIN <1.5*   No results for input(s): "LIPASE", "AMYLASE" in the last 168 hours. No results for input(s): "AMMONIA" in the last 168 hours.  ABG    Component Value Date/Time   PHART 7.45 02/04/2023 1329   PCO2ART 48 02/04/2023 1329   PO2ART 70 (L) 02/04/2023 1329   HCO3 33.4 (H) 02/04/2023 1329   TCO2 16 (L) 02/26/2023 2219   ACIDBASEDEF 3.6 (H) 01/28/2023 1022   O2SAT 95.7 02/04/2023 1329     Coagulation Profile: Recent Labs  Lab 02/26/23 2205  INR 1.3*    Cardiac Enzymes: No results for input(s): "CKTOTAL", "CKMB", "CKMBINDEX", "TROPONINI" in the last 168 hours.  HbA1C: Hgb A1c MFr Bld  Date/Time Value Ref Range Status  01/28/2023 07:28 AM 7.2 (H) 4.8 - 5.6 % Final    Comment:    (NOTE) Pre diabetes:          5.7%-6.4%  Diabetes:              >6.4%  Glycemic control for   <7.0% adults with diabetes   10/21/2022 12:52 PM 6.3 (H) 4.8 - 5.6 % Final    Comment:    (NOTE) Pre diabetes:          5.7%-6.4%  Diabetes:              >6.4%  Glycemic control for   <7.0% adults with diabetes     CBG: No results for input(s): "GLUCAP" in the last 168 hours.  Review of Systems:   Unobtainable 2/2 pt's baseline status, non verbal  Past Medical History:  He,  has a past medical history of Bulging lumbar disc, CAD (coronary artery disease) (1\6\1096), Chronic lower back pain, CKD (chronic kidney disease), stage II, Cocaine abuse (HCC), Cyst of epididymis, DM2 (diabetes mellitus, type 2) (HCC), Essential hypertension, Fournier gangrene, GERD (gastroesophageal reflux disease), Headache, History of pneumonia, Hyperlipidemia, Ischemic cardiomyopathy, LV (left ventricular) mural thrombus, and Sleep apnea.   Surgical History:   Past Surgical History:  Procedure Laterality Date   APPENDECTOMY     BIOPSY  11/11/2022    Procedure: BIOPSY;  Surgeon: Franky Macho, MD;  [facility];  Service: Endoscopy;;   BRONCHIAL NEEDLE ASPIRATION BIOPSY  11/12/2021   Procedure: BRONCHIAL NEEDLE ASPIRATION BIOPSIES;  Surgeon: Omar Person, MD;  [facility] ENDOSCOPY;  Service: Pulmonary;;   CARDIAC CATHETERIZATION N/A 09/07/2015   Procedure: Left Heart Cath and Coronary Angiography;  Surgeon: Marykay Lex, MD;  Location: [facility] INVASIVE CV LAB;  Service: Cardiovascular;  Laterality: N/A;   CARDIAC CATHETERIZATION N/A 09/07/2015   Procedure: Coronary Stent Intervention;  Surgeon: Marykay Lex, MD;  Location: [facility] INVASIVE CV LAB;  Service: Cardiovascular;  Laterality: N/A;   COLONOSCOPY WITH PROPOFOL N/A 11/11/2022   Procedure: COLONOSCOPY WITH PROPOFOL;  Surgeon: Franky Macho, MD;  [facility];  Service: Endoscopy;  Laterality: N/A;   CORONARY ANGIOGRAM  09/07/13   residual RCA and OM disease   CORONARY ANGIOPLASTY WITH STENT PLACEMENT     CORONARY STENT INTERVENTION N/A 10/16/2018   Procedure: CORONARY STENT INTERVENTION;  Surgeon: Kathleene Hazel, MD;  Location: [facility] INVASIVE CV LAB;  Service: Cardiovascular;  Laterality: N/A;   CORONARY STENT INTERVENTION N/A 03/11/2019   Procedure: CORONARY STENT INTERVENTION;  Surgeon: Corky Crafts, MD;  Location: [facility] INVASIVE CV LAB;  Service: Cardiovascular;  Laterality: N/A;   ESOPHAGOGASTRODUODENOSCOPY (EGD) WITH PROPOFOL N/A 11/11/2022   Procedure: ESOPHAGOGASTRODUODENOSCOPY (EGD) WITH PROPOFOL;  Surgeon: Franky Macho, MD;  [facility];  Service: Endoscopy;  Laterality: N/A;   ESOPHAGOGASTRODUODENOSCOPY (EGD) WITH PROPOFOL N/A 02/02/2023   Procedure: ESOPHAGOGASTRODUODENOSCOPY (EGD) WITH PROPOFOL;  Surgeon: Franky Macho, MD;  Location: [facility] ORS;  Service: General;  Laterality: N/A;   FRACTURE SURGERY     HEMOSTASIS CLIP PLACEMENT  11/11/2022   Procedure: HEMOSTASIS CLIP PLACEMENT;  Surgeon: Franky Macho, MD;  [facility];  Service: Endoscopy;;   INCISION AND DRAINAGE OF WOUND Left 05/19/2019   Procedure: DEBRIDEMENT LEFT GROIN;  Surgeon: Violeta Gelinas, MD;  Location: [facility] OR;  Service: General;  Laterality: Left;   INSERTION OF ILIAC STENT Left 11/30/2017   Left external illiac stent   INSERTION OF ILIAC STENT  11/30/2017   Procedure: Insertion Of Iliac Stent;  Surgeon: Runell Gess, MD;  Location: [facility] INVASIVE CV LAB;  Service: Cardiovascular;;  Left external illiac stent   KNEE ARTHROSCOPY Left    KNEE SURGERY     "ligaments, cartilage; tendon, put a pin in" (11/30/2017)   LEFT HEART CATH Bilateral 07/08/2012   Procedure: LEFT HEART CATH;  Surgeon: Corky Crafts, MD;  Location: [facility] CATH LAB;  Service: Cardiovascular;  Laterality: Bilateral;   LEFT HEART CATH AND CORONARY ANGIOGRAPHY N/A 10/16/2018   Procedure: LEFT HEART CATH AND CORONARY ANGIOGRAPHY;  Surgeon: Kathleene Hazel, MD;  Location: [facility] INVASIVE CV LAB;  Service: Cardiovascular;  Laterality: N/A;   LEFT HEART CATH AND CORONARY ANGIOGRAPHY N/A 03/11/2019   Procedure: LEFT HEART CATH AND CORONARY ANGIOGRAPHY;  Surgeon: Corky Crafts, MD;  Location: [facility] INVASIVE CV LAB;  Service: Cardiovascular;  Laterality: N/A;   LEFT HEART CATHETERIZATION WITH CORONARY ANGIOGRAM N/A 09/06/2013   STEMI, 2nd ISR LAD. Procedure: LEFT HEART CATHETERIZATION WITH CORONARY ANGIOGRAM;  Surgeon: Corky Crafts, MD;  Location: [facility] CATH LAB;  Service: Cardiovascular;  Laterality: N/A;   LOWER EXTREMITY ANGIOGRAPHY N/A 11/30/2017   Procedure: LOWER EXTREMITY ANGIOGRAPHY;  Surgeon: Runell Gess, MD;  Location: [facility] INVASIVE CV LAB;  Service: Cardiovascular;  Laterality: N/A;   PEG PLACEMENT N/A 02/02/2023   Procedure: PERCUTANEOUS ENDOSCOPIC GASTROSTOMY (PEG) PLACEMENT;  Surgeon: Franky Macho, MD;  Location: [facility];  Service: General;  Laterality: N/A;   PERCUTANEOUS CORONARY STENT INTERVENTION (PCI-S)  07/08/2012   Procedure: PERCUTANEOUS  CORONARY STENT INTERVENTION (PCI-S);  Surgeon: Corky Crafts, MD;  Location: [facility] CATH LAB;  Service: Cardiovascular;;  DES LAD   PERCUTANEOUS CORONARY STENT INTERVENTION (PCI-S) N/A 09/06/2013   Procedure: PERCUTANEOUS CORONARY STENT INTERVENTION (PCI-S);  Surgeon: Corky Crafts, MD;  Location: [facility] CATH LAB;  Service: Cardiovascular;  Laterality: N/A;  Mid LAD 3.0/24mm Promus   POLYPECTOMY  11/11/2022   Procedure: POLYPECTOMY INTESTINAL;  Surgeon: Franky Macho, MD;  [facility];  Service: Endoscopy;;   SCLEROTHERAPY  11/11/2022   Procedure: Susa Day;  Surgeon: Tasia Catchings,  Juanetta Beets, MD;  [facility];  Service: Endoscopy;;   SUBMUCOSAL TATTOO INJECTION  11/11/2022   Procedure: SUBMUCOSAL TATTOO INJECTION;  Surgeon: Franky Macho, MD;  [facility];  Service: Endoscopy;;   THORACENTESIS Right 11/10/2021   Procedure: Alanson Puls;  Surgeon: Leslye Peer, MD;  Location: [facility] ENDOSCOPY;  Service: Cardiopulmonary;  Laterality: Right;   VIDEO BRONCHOSCOPY WITH ENDOBRONCHIAL ULTRASOUND Right 11/12/2021   Procedure: VIDEO BRONCHOSCOPY WITH ENDOBRONCHIAL ULTRASOUND;  Surgeon: Omar Person, MD;  Location: [facility] ENDOSCOPY;  Service: Pulmonary;  Laterality: Right;   WOUND DEBRIDEMENT Left 05/20/2019   Procedure: DEBRIDEMENT GROIN;  Surgeon: Kinsinger, De Blanch, MD;  Location: [facility] OR;  Service: General;  Laterality: Left;   WRIST FRACTURE SURGERY Bilateral      Social History:   reports that he has been smoking cigarettes. He has a 24 pack-year smoking history. He has been exposed to tobacco smoke. He has never used smokeless tobacco. He reports that he does not currently use alcohol. He reports that he does not currently use drugs after having used the following drugs: Cocaine.   Family History:  His family history includes Diabetes in his mother; Hypertension in his mother.   Allergies Allergies  Allergen Reactions   Clopidogrel Other (See  Comments)    Drowsy, Skin irritation     Home Medications  Prior to Admission medications   Medication Sig Start Date End Date Taking? Authorizing Provider  acetaminophen (TYLENOL) 325 MG tablet Take 2 tablets (650 mg total) by mouth every 4 (four) hours as needed for mild pain (pain score 1-3) (or temp > 37.5 C (99.5 F)). 02/14/23   Vassie Loll, MD  aspirin 325 MG tablet Place 1 tablet (325 mg total) into feeding tube daily. 02/15/23   Vassie Loll, MD  atorvastatin (LIPITOR) 40 MG tablet Place 1 tablet (40 mg total) into feeding tube at bedtime. 02/14/23   Vassie Loll, MD  dexamethasone (DECADRON) 4 MG/ML injection Inject 0.25 mLs (1 mg total) into the vein every 12 (twelve) hours. 02/14/23   Vassie Loll, MD  ferrous sulfate 300 (60 Fe) MG/5ML syrup Place 5 mLs (300 mg total) into feeding tube daily. 02/15/23   Vassie Loll, MD  folic acid (FOLVITE) 1 MG tablet Place 1 tablet (1 mg total) into feeding tube daily. 02/15/23   Vassie Loll, MD  gabapentin (NEURONTIN) 250 MG/5ML solution Place 2 mLs (100 mg total) into feeding tube at bedtime. 02/14/23   Vassie Loll, MD  hydrALAZINE (APRESOLINE) 25 MG tablet Place 1 tablet (25 mg total) into feeding tube 2 (two) times daily. 02/14/23   Vassie Loll, MD  HYDROmorphone (DILAUDID) 1 MG/ML injection Inject 0.5 mLs (0.5 mg total) into the vein every 6 (six) hours as needed for severe pain (pain score 7-10). 02/14/23   Vassie Loll, MD  insulin aspart (NOVOLOG) 100 UNIT/ML injection Inject 0-20 Units into the skin every 4 (four) hours. 02/14/23   Vassie Loll, MD  insulin aspart (NOVOLOG) 100 UNIT/ML injection Inject 3 Units into the skin every 4 (four) hours. 02/14/23   Vassie Loll, MD  insulin glargine-yfgn (SEMGLEE) 100 UNIT/ML injection Inject 0.16 mLs (16 Units total) into the skin 2 (two) times daily. 02/14/23   Vassie Loll, MD  ipratropium-albuterol (DUONEB) 0.5-2.5 (3) MG/3ML SOLN Take 3 mLs by nebulization every 6 (six) hours as needed.  02/14/23   Vassie Loll, MD  isosorbide dinitrate (ISORDIL) 5 MG tablet Place 1 tablet (5 mg total) into feeding tube 2 (two)  times daily. 02/14/23   Vassie Loll, MD  linaclotide [facility]) 145 MCG CAPS capsule Take 1 capsule (145 mcg total) by mouth daily before breakfast. 12/24/22   Tat, Onalee Hua, MD  LORazepam (ATIVAN) 2 MG/ML injection Inject 0.5 mLs (1 mg total) into the vein every 8 (eight) hours as needed for anxiety or sedation (agitation). 02/14/23   Vassie Loll, MD  metoCLOPramide (REGLAN) 5 MG/ML injection Inject 2 mLs (10 mg total) into the vein every 8 (eight) hours. 02/14/23   Vassie Loll, MD  metoprolol tartrate (LOPRESSOR) 25 MG tablet Place 1 tablet (25 mg total) into feeding tube 2 (two) times daily. 02/14/23   Vassie Loll, MD  Nutritional Supplements (FEEDING SUPPLEMENT, OSMOLITE 1.5 CAL,) LIQD Place 1,000 mLs into feeding tube continuous. 60 mL/h continuous infusion 02/14/23   Vassie Loll, MD  pantoprazole (PROTONIX) 40 MG tablet Take 1 tablet (40 mg total) by mouth daily. Per tube 02/14/23   Vassie Loll, MD  polyethylene glycol (MIRALAX / GLYCOLAX) 17 g packet Place 17 g into feeding tube daily as needed for mild constipation. 02/14/23   Vassie Loll, MD  prochlorperazine (COMPAZINE) 10 MG tablet Take 1 tablet (10 mg total) by mouth every 6 (six) hours as needed for nausea or vomiting. 01/21/23   Dione Booze, MD  Protein (FEEDING SUPPLEMENT, PROSOURCE TF20,) liquid Place 60 mLs into feeding tube 2 (two) times daily. 02/14/23   Vassie Loll, MD  torsemide (DEMADEX) 20 MG tablet Take 1 tablet (20 mg total) by mouth daily. 2 02/14/23   Vassie Loll, MD  Water For Irrigation, Sterile (FREE WATER) SOLN Place 200 mLs into feeding tube every 4 (four) hours. 02/14/23   Vassie Loll, MD     Critical care time: 

## 2023-02-26 NOTE — ED Provider Notes (Signed)
[place] EMERGENCY DEPARTMENT AT [facility] Provider Note   CSN: [id number] Arrival date & time: 02/26/23  2137     History {Add pertinent medical, surgical, social history, OB history to HPI:1} Chief Complaint  Patient presents with   Rectal Bleeding   Hypotension    [name] is a 68 y.o. male presenting to the ED with concern for hypotension and large bloody bowel movements.  Per paperwork provided by [facility] facility, the patient has a history of stroke, left-sided hemiparesis, dysphagia, severe dementia, presenting from [facility] facility with concern for hypotension and very large maroon-colored bowel movement that occurred earlier today.  He was being treated for septic shock with IV antibiotics with noted MSSA bacteremia and E. coli, and has been improving clinically until this event tonight.  He is on aspirin through a PEG tube.  The patient is essentially nonverbal due to advanced dementia.  He has a signed DNR form at the bedside.  EMS give the patient 1 L of fluid complete en route to the hospital with some improvement of his blood pressure and about a quarter of a second liter of fluid, started him on peripheral Levophed at small dose 2 mcg.  HPI     Home Medications Prior to Admission medications   Medication Sig Start Date End Date Taking? Authorizing Provider  acetaminophen (TYLENOL) 325 MG tablet Take 2 tablets (650 mg total) by mouth every 4 (four) hours as needed for mild pain (pain score 1-3) (or temp > 37.5 C (99.5 F)). 02/14/23   Vassie Loll, MD  aspirin 325 MG tablet Place 1 tablet (325 mg total) into feeding tube daily. 02/15/23   Vassie Loll, MD  atorvastatin (LIPITOR) 40 MG tablet Place 1 tablet (40 mg total) into feeding tube at bedtime. 02/14/23   Vassie Loll, MD  dexamethasone (DECADRON) 4 MG/ML injection Inject 0.25 mLs (1 mg total) into the vein every 12 (twelve) hours. 02/14/23   Vassie Loll, MD  ferrous sulfate 300 (60 Fe) MG/5ML  syrup Place 5 mLs (300 mg total) into feeding tube daily. 02/15/23   Vassie Loll, MD  folic acid (FOLVITE) 1 MG tablet Place 1 tablet (1 mg total) into feeding tube daily. 02/15/23   Vassie Loll, MD  gabapentin (NEURONTIN) 250 MG/5ML solution Place 2 mLs (100 mg total) into feeding tube at bedtime. 02/14/23   Vassie Loll, MD  hydrALAZINE (APRESOLINE) 25 MG tablet Place 1 tablet (25 mg total) into feeding tube 2 (two) times daily. 02/14/23   Vassie Loll, MD  HYDROmorphone (DILAUDID) 1 MG/ML injection Inject 0.5 mLs (0.5 mg total) into the vein every 6 (six) hours as needed for severe pain (pain score 7-10). 02/14/23   Vassie Loll, MD  insulin aspart (NOVOLOG) 100 UNIT/ML injection Inject 0-20 Units into the skin every 4 (four) hours. 02/14/23   Vassie Loll, MD  insulin aspart (NOVOLOG) 100 UNIT/ML injection Inject 3 Units into the skin every 4 (four) hours. 02/14/23   Vassie Loll, MD  insulin glargine-yfgn (SEMGLEE) 100 UNIT/ML injection Inject 0.16 mLs (16 Units total) into the skin 2 (two) times daily. 02/14/23   Vassie Loll, MD  ipratropium-albuterol (DUONEB) 0.5-2.5 (3) MG/3ML SOLN Take 3 mLs by nebulization every 6 (six) hours as needed. 02/14/23   Vassie Loll, MD  isosorbide dinitrate (ISORDIL) 5 MG tablet Place 1 tablet (5 mg total) into feeding tube 2 (two) times daily. 02/14/23   Vassie Loll, MD  linaclotide [facility]) 145 MCG CAPS capsule Take 1 capsule (  145 mcg total) by mouth daily before breakfast. 12/24/22   Tat, Onalee Hua, MD  LORazepam (ATIVAN) 2 MG/ML injection Inject 0.5 mLs (1 mg total) into the vein every 8 (eight) hours as needed for anxiety or sedation (agitation). 02/14/23   Vassie Loll, MD  metoCLOPramide (REGLAN) 5 MG/ML injection Inject 2 mLs (10 mg total) into the vein every 8 (eight) hours. 02/14/23   Vassie Loll, MD  metoprolol tartrate (LOPRESSOR) 25 MG tablet Place 1 tablet (25 mg total) into feeding tube 2 (two) times daily. 02/14/23   Vassie Loll, MD  Nutritional  Supplements (FEEDING SUPPLEMENT, OSMOLITE 1.5 CAL,) LIQD Place 1,000 mLs into feeding tube continuous. 60 mL/h continuous infusion 02/14/23   Vassie Loll, MD  pantoprazole (PROTONIX) 40 MG tablet Take 1 tablet (40 mg total) by mouth daily. Per tube 02/14/23   Vassie Loll, MD  polyethylene glycol (MIRALAX / GLYCOLAX) 17 g packet Place 17 g into feeding tube daily as needed for mild constipation. 02/14/23   Vassie Loll, MD  prochlorperazine (COMPAZINE) 10 MG tablet Take 1 tablet (10 mg total) by mouth every 6 (six) hours as needed for nausea or vomiting. 01/21/23   Dione Booze, MD  Protein (FEEDING SUPPLEMENT, PROSOURCE TF20,) liquid Place 60 mLs into feeding tube 2 (two) times daily. 02/14/23   Vassie Loll, MD  torsemide (DEMADEX) 20 MG tablet Take 1 tablet (20 mg total) by mouth daily. 2 02/14/23   Vassie Loll, MD  Water For Irrigation, Sterile (FREE WATER) SOLN Place 200 mLs into feeding tube every 4 (four) hours. 02/14/23   Vassie Loll, MD      Allergies    Clopidogrel    Review of Systems   Review of Systems  Physical Exam Updated Vital Signs There were no vitals taken for this visit. Physical Exam Constitutional:      General: He is not in acute distress.    Comments: Moaning, yelling  HENT:     Head: Normocephalic and atraumatic.  Eyes:     Pupils: Pupils are equal, round, and reactive to light.     Comments: Conjunctival pallor  Cardiovascular:     Rate and Rhythm: Regular rhythm. Tachycardia present.  Pulmonary:     Effort: Pulmonary effort is normal. No respiratory distress.  Abdominal:     General: There is no distension.     Tenderness: There is no abdominal tenderness.  Skin:    General: Skin is warm and dry.  Neurological:     General: No focal deficit present.     Mental Status: He is alert. Mental status is at baseline.     ED Results / Procedures / Treatments   Labs (all labs ordered are listed, but only abnormal results are displayed) Labs Reviewed -  No data to display  EKG None  Radiology No results found.  Procedures Procedures  {Document cardiac monitor, telemetry assessment procedure when appropriate:1}  Medications Ordered in ED Medications - No data to display  ED Course/ Medical Decision Making/ A&P   {   Click here for ABCD2, HEART and other calculatorsREFRESH Note before signing :1}                              Medical Decision Making Amount and/or Complexity of Data Reviewed Labs: ordered. Radiology: ordered.   ***  {Document critical care time when appropriate:1} {Document review of labs and clinical decision tools ie heart score, Chads2Vasc2 etc:1}  {Document your  independent review of radiology images, and any outside records:1} {Document your discussion with family members, caretakers, and with consultants:1} {Document social determinants of health affecting pt's care:1} {Document your decision making why or why not admission, treatments were needed:1} Final Clinical Impression(s) / ED Diagnoses Final diagnoses:  None    Rx / DC Orders ED Discharge Orders     None

## 2023-02-26 NOTE — ED Notes (Signed)
 ED Provider at bedside.

## 2023-02-27 ENCOUNTER — Inpatient Hospital Stay (HOSPITAL_COMMUNITY): Payer: No Typology Code available for payment source

## 2023-02-27 ENCOUNTER — Other Ambulatory Visit: Payer: Self-pay

## 2023-02-27 DIAGNOSIS — E440 Moderate protein-calorie malnutrition: Secondary | ICD-10-CM | POA: Diagnosis present

## 2023-02-27 DIAGNOSIS — R578 Other shock: Secondary | ICD-10-CM | POA: Insufficient documentation

## 2023-02-27 DIAGNOSIS — J155 Pneumonia due to Escherichia coli: Secondary | ICD-10-CM | POA: Diagnosis present

## 2023-02-27 DIAGNOSIS — R7989 Other specified abnormal findings of blood chemistry: Secondary | ICD-10-CM

## 2023-02-27 DIAGNOSIS — G928 Other toxic encephalopathy: Secondary | ICD-10-CM | POA: Diagnosis present

## 2023-02-27 DIAGNOSIS — E872 Acidosis, unspecified: Secondary | ICD-10-CM | POA: Diagnosis present

## 2023-02-27 DIAGNOSIS — A4102 Sepsis due to Methicillin resistant Staphylococcus aureus: Secondary | ICD-10-CM | POA: Diagnosis present

## 2023-02-27 DIAGNOSIS — R571 Hypovolemic shock: Secondary | ICD-10-CM | POA: Diagnosis not present

## 2023-02-27 DIAGNOSIS — I130 Hypertensive heart and chronic kidney disease with heart failure and stage 1 through stage 4 chronic kidney disease, or unspecified chronic kidney disease: Secondary | ICD-10-CM | POA: Diagnosis present

## 2023-02-27 DIAGNOSIS — Z1152 Encounter for screening for COVID-19: Secondary | ICD-10-CM | POA: Diagnosis not present

## 2023-02-27 DIAGNOSIS — I69354 Hemiplegia and hemiparesis following cerebral infarction affecting left non-dominant side: Secondary | ICD-10-CM | POA: Diagnosis not present

## 2023-02-27 DIAGNOSIS — A419 Sepsis, unspecified organism: Secondary | ICD-10-CM

## 2023-02-27 DIAGNOSIS — J189 Pneumonia, unspecified organism: Secondary | ICD-10-CM | POA: Diagnosis not present

## 2023-02-27 DIAGNOSIS — Z6831 Body mass index (BMI) 31.0-31.9, adult: Secondary | ICD-10-CM | POA: Diagnosis not present

## 2023-02-27 DIAGNOSIS — B49 Unspecified mycosis: Secondary | ICD-10-CM | POA: Diagnosis not present

## 2023-02-27 DIAGNOSIS — Z7189 Other specified counseling: Secondary | ICD-10-CM | POA: Diagnosis not present

## 2023-02-27 DIAGNOSIS — K921 Melena: Principal | ICD-10-CM

## 2023-02-27 DIAGNOSIS — N390 Urinary tract infection, site not specified: Secondary | ICD-10-CM | POA: Diagnosis not present

## 2023-02-27 DIAGNOSIS — Z66 Do not resuscitate: Secondary | ICD-10-CM | POA: Diagnosis present

## 2023-02-27 DIAGNOSIS — I5020 Unspecified systolic (congestive) heart failure: Secondary | ICD-10-CM | POA: Diagnosis not present

## 2023-02-27 DIAGNOSIS — K922 Gastrointestinal hemorrhage, unspecified: Secondary | ICD-10-CM

## 2023-02-27 DIAGNOSIS — R7881 Bacteremia: Secondary | ICD-10-CM | POA: Diagnosis not present

## 2023-02-27 DIAGNOSIS — D62 Acute posthemorrhagic anemia: Secondary | ICD-10-CM | POA: Diagnosis present

## 2023-02-27 DIAGNOSIS — R6521 Severe sepsis with septic shock: Secondary | ICD-10-CM

## 2023-02-27 DIAGNOSIS — R1319 Other dysphagia: Secondary | ICD-10-CM

## 2023-02-27 DIAGNOSIS — I358 Other nonrheumatic aortic valve disorders: Secondary | ICD-10-CM | POA: Diagnosis present

## 2023-02-27 DIAGNOSIS — D6959 Other secondary thrombocytopenia: Secondary | ICD-10-CM | POA: Diagnosis present

## 2023-02-27 DIAGNOSIS — J15211 Pneumonia due to Methicillin susceptible Staphylococcus aureus: Secondary | ICD-10-CM | POA: Diagnosis present

## 2023-02-27 DIAGNOSIS — K559 Vascular disorder of intestine, unspecified: Secondary | ICD-10-CM | POA: Diagnosis present

## 2023-02-27 DIAGNOSIS — E1122 Type 2 diabetes mellitus with diabetic chronic kidney disease: Secondary | ICD-10-CM | POA: Diagnosis present

## 2023-02-27 DIAGNOSIS — N183 Chronic kidney disease, stage 3 unspecified: Secondary | ICD-10-CM | POA: Diagnosis present

## 2023-02-27 DIAGNOSIS — A0811 Acute gastroenteropathy due to Norwalk agent: Secondary | ICD-10-CM | POA: Diagnosis not present

## 2023-02-27 DIAGNOSIS — R579 Shock, unspecified: Secondary | ICD-10-CM | POA: Diagnosis not present

## 2023-02-27 DIAGNOSIS — B9620 Unspecified Escherichia coli [E. coli] as the cause of diseases classified elsewhere: Secondary | ICD-10-CM | POA: Diagnosis not present

## 2023-02-27 DIAGNOSIS — J15212 Pneumonia due to Methicillin resistant Staphylococcus aureus: Secondary | ICD-10-CM | POA: Diagnosis present

## 2023-02-27 DIAGNOSIS — I5042 Chronic combined systolic (congestive) and diastolic (congestive) heart failure: Secondary | ICD-10-CM | POA: Diagnosis present

## 2023-02-27 DIAGNOSIS — Z515 Encounter for palliative care: Secondary | ICD-10-CM | POA: Diagnosis not present

## 2023-02-27 DIAGNOSIS — Z95820 Peripheral vascular angioplasty status with implants and grafts: Secondary | ICD-10-CM | POA: Diagnosis not present

## 2023-02-27 DIAGNOSIS — F03C4 Unspecified dementia, severe, with anxiety: Secondary | ICD-10-CM | POA: Diagnosis present

## 2023-02-27 DIAGNOSIS — D649 Anemia, unspecified: Secondary | ICD-10-CM

## 2023-02-27 LAB — CBC WITH DIFFERENTIAL/PLATELET
Abs Immature Granulocytes: 0.24 10*3/uL — ABNORMAL HIGH (ref 0.00–0.07)
Abs Immature Granulocytes: 0.26 10*3/uL — ABNORMAL HIGH (ref 0.00–0.07)
Abs Immature Granulocytes: 0 10*3/uL (ref 0.00–0.07)
Basophils Absolute: 0.1 10*3/uL (ref 0.0–0.1)
Basophils Absolute: 0 10*3/uL (ref 0.0–0.1)
Basophils Absolute: 0 10*3/uL (ref 0.0–0.1)
Basophils Relative: 0 %
Basophils Relative: 0 %
Basophils Relative: 0 %
Eosinophils Absolute: 0 10*3/uL (ref 0.0–0.5)
Eosinophils Absolute: 0 10*3/uL (ref 0.0–0.5)
Eosinophils Absolute: 0 10*3/uL (ref 0.0–0.5)
Eosinophils Relative: 0 %
Eosinophils Relative: 0 %
Eosinophils Relative: 0 %
HCT: 26 % — ABNORMAL LOW (ref 39.0–52.0)
HCT: 24.1 % — ABNORMAL LOW (ref 39.0–52.0)
HCT: 25.6 % — ABNORMAL LOW (ref 39.0–52.0)
Hemoglobin: 7.7 g/dL — ABNORMAL LOW (ref 13.0–17.0)
Hemoglobin: 7.6 g/dL — ABNORMAL LOW (ref 13.0–17.0)
Hemoglobin: 8.5 g/dL — ABNORMAL LOW (ref 13.0–17.0)
Immature Granulocytes: 1 %
Immature Granulocytes: 1 %
Lymphocytes Relative: 2 %
Lymphocytes Relative: 1 %
Lymphocytes Relative: 1 %
Lymphs Abs: 0.3 10*3/uL — ABNORMAL LOW (ref 0.7–4.0)
Lymphs Abs: 0.3 10*3/uL — ABNORMAL LOW (ref 0.7–4.0)
Lymphs Abs: 0.2 10*3/uL — ABNORMAL LOW (ref 0.7–4.0)
MCH: 26.3 pg (ref 26.0–34.0)
MCH: 25.6 pg — ABNORMAL LOW (ref 26.0–34.0)
MCH: 26.2 pg (ref 26.0–34.0)
MCHC: 29.6 g/dL — ABNORMAL LOW (ref 30.0–36.0)
MCHC: 31.5 g/dL (ref 30.0–36.0)
MCHC: 33.2 g/dL (ref 30.0–36.0)
MCV: 88.7 fL (ref 80.0–100.0)
MCV: 81.1 fL (ref 80.0–100.0)
MCV: 78.8 fL — ABNORMAL LOW (ref 80.0–100.0)
Monocytes Absolute: 0.4 10*3/uL (ref 0.1–1.0)
Monocytes Absolute: 0.3 10*3/uL (ref 0.1–1.0)
Monocytes Absolute: 0.7 10*3/uL (ref 0.1–1.0)
Monocytes Relative: 2 %
Monocytes Relative: 2 %
Monocytes Relative: 3 %
Neutro Abs: 16.5 10*3/uL — ABNORMAL HIGH (ref 1.7–7.7)
Neutro Abs: 17.8 10*3/uL — ABNORMAL HIGH (ref 1.7–7.7)
Neutro Abs: 22.1 10*3/uL — ABNORMAL HIGH (ref 1.7–7.7)
Neutrophils Relative %: 95 %
Neutrophils Relative %: 96 %
Neutrophils Relative %: 96 %
Platelets: 107 10*3/uL — ABNORMAL LOW (ref 150–400)
Platelets: 125 10*3/uL — ABNORMAL LOW (ref 150–400)
Platelets: 111 10*3/uL — ABNORMAL LOW (ref 150–400)
RBC: 2.93 MIL/uL — ABNORMAL LOW (ref 4.22–5.81)
RBC: 2.97 MIL/uL — ABNORMAL LOW (ref 4.22–5.81)
RBC: 3.25 MIL/uL — ABNORMAL LOW (ref 4.22–5.81)
RDW: 22.6 % — ABNORMAL HIGH (ref 11.5–15.5)
RDW: 22.3 % — ABNORMAL HIGH (ref 11.5–15.5)
RDW: 22.3 % — ABNORMAL HIGH (ref 11.5–15.5)
WBC: 17.5 10*3/uL — ABNORMAL HIGH (ref 4.0–10.5)
WBC: 18.8 10*3/uL — ABNORMAL HIGH (ref 4.0–10.5)
WBC: 23 10*3/uL — ABNORMAL HIGH (ref 4.0–10.5)
nRBC: 0.5 % — ABNORMAL HIGH (ref 0.0–0.2)
nRBC: 0.3 % — ABNORMAL HIGH (ref 0.0–0.2)
nRBC: 0.5 % — ABNORMAL HIGH (ref 0.0–0.2)
nRBC: 2 /100{WBCs} — ABNORMAL HIGH

## 2023-02-27 LAB — GLUCOSE, CAPILLARY
Glucose-Capillary: 134 mg/dL — ABNORMAL HIGH (ref 70–99)
Glucose-Capillary: 62 mg/dL — ABNORMAL LOW (ref 70–99)
Glucose-Capillary: 64 mg/dL — ABNORMAL LOW (ref 70–99)
Glucose-Capillary: 72 mg/dL (ref 70–99)
Glucose-Capillary: 83 mg/dL (ref 70–99)
Glucose-Capillary: 90 mg/dL (ref 70–99)
Glucose-Capillary: 91 mg/dL (ref 70–99)
Glucose-Capillary: 96 mg/dL (ref 70–99)

## 2023-02-27 LAB — COMPREHENSIVE METABOLIC PANEL
ALT: 89 U/L — ABNORMAL HIGH (ref 0–44)
AST: 80 U/L — ABNORMAL HIGH (ref 15–41)
Albumin: <1.5 g/dL — ABNORMAL LOW (ref 3.5–5.0)
Alkaline Phosphatase: 305 U/L — ABNORMAL HIGH (ref 38–126)
Anion gap: 14 (ref 5–15)
BUN: 102 mg/dL — ABNORMAL HIGH (ref 8–23)
CO2: 15 mmol/L — ABNORMAL LOW (ref 22–32)
Calcium: 8.4 mg/dL — ABNORMAL LOW (ref 8.9–10.3)
Chloride: 108 mmol/L (ref 98–111)
Creatinine, Ser: 2.53 mg/dL — ABNORMAL HIGH (ref 0.61–1.24)
GFR, Estimated: 27 mL/min — ABNORMAL LOW (ref >60)
Glucose, Bld: 123 mg/dL — ABNORMAL HIGH (ref 70–99)
Potassium: 5.1 mmol/L (ref 3.5–5.1)
Sodium: 137 mmol/L (ref 135–145)
Total Bilirubin: 2 mg/dL — ABNORMAL HIGH (ref 0.0–1.2)
Total Protein: 3.8 g/dL — ABNORMAL LOW (ref 6.5–8.1)

## 2023-02-27 LAB — BASIC METABOLIC PANEL
Anion gap: 11 (ref 5–15)
BUN: 90 mg/dL — ABNORMAL HIGH (ref 8–23)
CO2: 17 mmol/L — ABNORMAL LOW (ref 22–32)
Calcium: 8.1 mg/dL — ABNORMAL LOW (ref 8.9–10.3)
Chloride: 110 mmol/L (ref 98–111)
Creatinine, Ser: 2.27 mg/dL — ABNORMAL HIGH (ref 0.61–1.24)
GFR, Estimated: 31 mL/min — ABNORMAL LOW (ref >60)
Glucose, Bld: 93 mg/dL (ref 70–99)
Potassium: 4.5 mmol/L (ref 3.5–5.1)
Sodium: 138 mmol/L (ref 135–145)

## 2023-02-27 LAB — ECHOCARDIOGRAM COMPLETE
Area-P 1/2: 6.71 cm2
Calc EF: 18.5 %
S' Lateral: 4.6 cm
Single Plane A2C EF: 21.5 %
Single Plane A4C EF: 12.8 %

## 2023-02-27 LAB — CBC
HCT: 26 % — ABNORMAL LOW (ref 39.0–52.0)
Hemoglobin: 8.5 g/dL — ABNORMAL LOW (ref 13.0–17.0)
MCH: 26.2 pg (ref 26.0–34.0)
MCHC: 32.7 g/dL (ref 30.0–36.0)
MCV: 80.2 fL (ref 80.0–100.0)
Platelets: 125 10*3/uL — ABNORMAL LOW (ref 150–400)
RBC: 3.24 MIL/uL — ABNORMAL LOW (ref 4.22–5.81)
RDW: 22.1 % — ABNORMAL HIGH (ref 11.5–15.5)
WBC: 24.5 10*3/uL — ABNORMAL HIGH (ref 4.0–10.5)
nRBC: 0.5 % — ABNORMAL HIGH (ref 0.0–0.2)

## 2023-02-27 LAB — C DIFFICILE QUICK SCREEN W PCR REFLEX
C Diff antigen: NEGATIVE
C Diff interpretation: NOT DETECTED
C Diff toxin: NEGATIVE

## 2023-02-27 LAB — MAGNESIUM
Magnesium: 1.8 mg/dL (ref 1.7–2.4)
Magnesium: 2.2 mg/dL (ref 1.7–2.4)

## 2023-02-27 LAB — MRSA NEXT GEN BY PCR, NASAL: MRSA by PCR Next Gen: DETECTED — AB

## 2023-02-27 LAB — PHOSPHORUS: Phosphorus: 5.1 mg/dL — ABNORMAL HIGH (ref 2.5–4.6)

## 2023-02-27 LAB — LACTIC ACID, PLASMA: Lactic Acid, Venous: 2.3 mmol/L (ref 0.5–1.9)

## 2023-02-27 LAB — GAMMA GT: GGT: 275 U/L — ABNORMAL HIGH (ref 7–50)

## 2023-02-27 LAB — PREPARE RBC (CROSSMATCH)

## 2023-02-27 MED ORDER — PERFLUTREN LIPID MICROSPHERE
1.0000 mL | INTRAVENOUS | Status: AC | PRN
  Administered 2023-02-27: 2 mL via INTRAVENOUS

## 2023-02-27 MED ORDER — ACETAMINOPHEN 325 MG PO TABS
650.0000 mg | ORAL_TABLET | Freq: Four times a day (QID) | ORAL | Status: DC | PRN
  Administered 2023-03-01 (×3): 650 mg
  Filled 2023-02-27 (×3): qty 2

## 2023-02-27 MED ORDER — GABAPENTIN 250 MG/5ML PO SOLN
100.0000 mg | Freq: Every day | ORAL | Status: DC
  Administered 2023-02-28 – 2023-03-01 (×2): 100 mg
  Filled 2023-02-27 (×2): qty 2

## 2023-02-27 MED ORDER — PIPERACILLIN-TAZOBACTAM 3.375 G IVPB
3.3750 g | Freq: Three times a day (TID) | INTRAVENOUS | Status: DC
  Administered 2023-02-27 – 2023-02-28 (×3): 3.375 g via INTRAVENOUS
  Filled 2023-02-27 (×3): qty 50

## 2023-02-27 MED ORDER — POLYETHYLENE GLYCOL 3350 17 G PO PACK: 17.0000 g | PACK | Freq: Every day | ORAL | Status: DC | PRN

## 2023-02-27 MED ORDER — MUPIROCIN 2 % EX OINT
1.0000 | TOPICAL_OINTMENT | Freq: Two times a day (BID) | CUTANEOUS | Status: DC
  Administered 2023-02-27 – 2023-03-02 (×8): 1 via NASAL
  Filled 2023-02-27: qty 22

## 2023-02-27 MED ORDER — FOLIC ACID 1 MG PO TABS
1.0000 mg | ORAL_TABLET | Freq: Every day | ORAL | Status: DC
  Administered 2023-02-27 – 2023-03-02 (×4): 1 mg
  Filled 2023-02-27 (×4): qty 1

## 2023-02-27 MED ORDER — DEXTROSE IN LACTATED RINGERS 5 % IV SOLN: INTRAVENOUS | Status: AC

## 2023-02-27 MED ORDER — MAGNESIUM SULFATE 2 GM/50ML IV SOLN
2.0000 g | Freq: Once | INTRAVENOUS | Status: AC
  Administered 2023-02-27: 2 g via INTRAVENOUS
  Filled 2023-02-27: qty 50

## 2023-02-27 MED ORDER — SODIUM BICARBONATE 8.4 % IV SOLN
100.0000 meq | Freq: Once | INTRAVENOUS | Status: AC
  Administered 2023-02-27: 100 meq via INTRAVENOUS
  Filled 2023-02-27: qty 50

## 2023-02-27 MED ORDER — CHLORHEXIDINE GLUCONATE CLOTH 2 % EX PADS
6.0000 | MEDICATED_PAD | Freq: Every day | CUTANEOUS | Status: DC
  Administered 2023-02-27 – 2023-03-02 (×5): 6 via TOPICAL

## 2023-02-27 MED ORDER — DEXTROSE 50 % IV SOLN
INTRAVENOUS | Status: AC
  Administered 2023-02-27: 25 mL
  Filled 2023-02-27: qty 50

## 2023-02-27 MED ORDER — HYDROMORPHONE HCL 1 MG/ML IJ SOLN
0.5000 mg | Freq: Once | INTRAMUSCULAR | Status: AC
  Administered 2023-02-27: 0.5 mg via INTRAVENOUS
  Filled 2023-02-27: qty 0.5

## 2023-02-27 MED ORDER — DOCUSATE SODIUM 100 MG PO CAPS: 100.0000 mg | ORAL_CAPSULE | Freq: Two times a day (BID) | ORAL | Status: DC | PRN

## 2023-02-27 MED ORDER — SODIUM CHLORIDE 0.9% IV SOLUTION: Freq: Once | INTRAVENOUS | Status: AC

## 2023-02-27 MED ORDER — QUETIAPINE FUMARATE 25 MG PO TABS
25.0000 mg | ORAL_TABLET | Freq: Two times a day (BID) | ORAL | Status: DC
  Administered 2023-02-27: 25 mg
  Filled 2023-02-27: qty 1

## 2023-02-27 MED ORDER — DEXTROSE 50 % IV SOLN
12.5000 g | INTRAVENOUS | Status: AC
  Administered 2023-02-27: 12.5 g via INTRAVENOUS
  Filled 2023-02-27: qty 50

## 2023-02-27 MED ORDER — ATORVASTATIN CALCIUM 40 MG PO TABS
40.0000 mg | ORAL_TABLET | Freq: Every day | ORAL | Status: DC
  Administered 2023-02-27 – 2023-03-02 (×4): 40 mg
  Filled 2023-02-27 (×4): qty 1

## 2023-02-27 MED ORDER — GABAPENTIN 250 MG/5ML PO SOLN
100.0000 mg | Freq: Every day | ORAL | Status: DC
  Administered 2023-02-27: 100 mg via ORAL
  Filled 2023-02-27: qty 2

## 2023-02-27 MED ORDER — CALCIUM GLUCONATE-NACL 2-0.675 GM/100ML-% IV SOLN
2.0000 g | Freq: Once | INTRAVENOUS | Status: AC
  Administered 2023-02-27: 2000 mg via INTRAVENOUS
  Filled 2023-02-27: qty 100

## 2023-02-27 MED ORDER — DEXTROSE 50 % IV SOLN
INTRAVENOUS | Status: AC
  Filled 2023-02-27: qty 50

## 2023-02-27 MED ORDER — DEXAMETHASONE SODIUM PHOSPHATE 4 MG/ML IJ SOLN
1.0000 mg | Freq: Two times a day (BID) | INTRAMUSCULAR | Status: AC
  Administered 2023-02-27 – 2023-03-01 (×6): 1 mg via INTRAVENOUS
  Filled 2023-02-27 (×6): qty 0.25

## 2023-02-27 MED ORDER — DOCUSATE SODIUM 50 MG/5ML PO LIQD: 100.0000 mg | Freq: Two times a day (BID) | ORAL | Status: DC | PRN

## 2023-02-27 MED ORDER — INSULIN ASPART 100 UNIT/ML IJ SOLN
0.0000 [IU] | INTRAMUSCULAR | Status: DC
  Administered 2023-02-27: 2 [IU] via SUBCUTANEOUS

## 2023-02-27 MED ORDER — SODIUM CHLORIDE 0.9 % IV SOLN
1.0000 g | Freq: Once | INTRAVENOUS | Status: DC
  Filled 2023-02-27: qty 10

## 2023-02-27 MED ORDER — HYDROMORPHONE HCL 1 MG/ML IJ SOLN
0.5000 mg | Freq: Four times a day (QID) | INTRAMUSCULAR | Status: DC | PRN
  Administered 2023-02-27 – 2023-03-01 (×7): 0.5 mg via INTRAVENOUS
  Filled 2023-02-27 (×8): qty 0.5

## 2023-02-27 MED ORDER — OXYCODONE HCL 5 MG PO TABS
5.0000 mg | ORAL_TABLET | Freq: Four times a day (QID) | ORAL | Status: DC | PRN
  Administered 2023-02-27 – 2023-02-28 (×5): 5 mg
  Filled 2023-02-27 (×5): qty 1

## 2023-02-27 MED ORDER — LORAZEPAM 2 MG/ML IJ SOLN
1.0000 mg | Freq: Four times a day (QID) | INTRAMUSCULAR | Status: DC | PRN
  Administered 2023-02-27 – 2023-03-02 (×4): 1 mg via INTRAVENOUS
  Filled 2023-02-27 (×4): qty 1

## 2023-02-27 MED ORDER — LINEZOLID 600 MG/300ML IV SOLN
600.0000 mg | Freq: Two times a day (BID) | INTRAVENOUS | Status: DC
  Administered 2023-02-27 – 2023-02-28 (×2): 600 mg via INTRAVENOUS
  Filled 2023-02-27 (×3): qty 300

## 2023-02-27 NOTE — Progress Notes (Signed)
eLink Physician-Brief Progress Note Patient Name: [name] DOB: [birth year] MRN: [id number]   Date of Service  02/27/2023  HPI/Events of Note  Patient refusing all lab draws.  He is complaining of severe pain all over.  eICU Interventions  Video assessment of patient done.  He does not appear to be in pain.  Will give him a one-time dose of hydromorphone 0.5 mg IV and after that we will ask him again if he will allow blood to be drawn now that his pain is under control. Bedside nurse present for interaction.     Intervention Category Intermediate Interventions: Pain - evaluation and management  Carilyn Goodpasture 02/27/2023, 4:22 AM

## 2023-02-27 NOTE — Progress Notes (Signed)
Hypoglycemic Event  CBG: 62  Treatment: D50 25 mL (12.5 gm)  Symptoms: None  Follow-up CBG: Time:2057 CBG Result:83  Possible Reasons for Event: Inadequate meal intake- pt NPO  Comments/MD notified:Elink notified    Arlyn Leak

## 2023-02-27 NOTE — Progress Notes (Signed)
Patient speaking obscenities to this RN and threatening to punch this RN.  Patient redirected.   MD placed orders for medication to assist patient with pain and anxiety.

## 2023-02-27 NOTE — ED Notes (Signed)
Update provided to daughter, Shon Indelicato, regarding pt admission.

## 2023-02-27 NOTE — Procedures (Addendum)
Central Venous Catheter Insertion Procedure Note  [name]  [id number]  1955/07/23  Date:02/27/23  Time:3:51 PM   Provider Performing:Meegan Shanafelt Erby Pian    Procedure: Insertion of Non-tunneled Central Venous [id number]) with US guidance (56213)   Supervised by Anders Simmonds CCM NP   Indication(s) Medication administration Difficult access  Consent Risks of the procedure as well as the alternatives and risks of each were explained to the patient and/or caregiver.  Consent for the procedure was obtained and is signed in the bedside chart Judith Blonder (Daughter) provided consent via phone  Anesthesia Topical only with 1% lidocaine   Timeout Verified patient identification, verified procedure, site/side was marked, verified correct patient position, special equipment/implants available, medications/allergies/relevant history reviewed, required imaging and test results available.  Sterile Technique Maximal sterile technique including full sterile barrier drape, hand hygiene, sterile gown, sterile gloves, mask, hair covering, sterile ultrasound probe cover (if used).  Procedure Description Area of catheter insertion was cleaned with chlorhexidine and draped in sterile fashion.  With real-time ultrasound guidance a central venous catheter was placed into the right internal jugular vein. Nonpulsatile blood flow and easy flushing noted in all ports.  The catheter was sutured in place and sterile dressing applied.  Complications/Tolerance None; patient tolerated the procedure well. Chest X-ray is ordered to verify placement for internal jugular or subclavian cannulation.   Chest x-ray is not ordered for femoral cannulation.  EBL Minimal  Specimen(s) None  Hazel Sams AGACNP-BC   [facility] 02/27/2023, 3:52 PM  Please see [email or web address] for pager details.  From 7A-7P if no response, please call [phone]. After hours, please call ELink  [phone].

## 2023-02-27 NOTE — Progress Notes (Signed)
Patient is noted in FYI to be an Interventional Radiology only PICC.  Secure chat sent to bedside nurse with this information.

## 2023-02-27 NOTE — Progress Notes (Signed)
eLink Physician-Brief Progress Note Patient Name: [name] DOB: [birth year] MRN: [id number]   Date of Service  02/27/2023  HPI/Events of Note  68 year old male who presented from an LTAC with hypotension after having a large melanotic bowel movement today.  Found to be severely anemic.  Also complaining of generalized pain all over. Required pressors and admitted to ICU.  eICU Interventions  Patient's chart reviewed.  Pertinent labs and imaging studies reviewed.  Video assessment of patient done. He has received blood.  Follow-up H&H. Pain control with one-time dose of hydromorphone.  Will assess for ongoing needs. Twice daily PPI.  GI eval. Continue IV antibiotics which she was on at [facility]. Discussed with bedside nurse.     Intervention Category Evaluation Type: New Patient Evaluation  Carilyn Goodpasture 02/27/2023, 4:54 AM

## 2023-02-27 NOTE — Consult Note (Addendum)
Referring Provider: Dr. Karie Fetch  Primary Care Physician:  Benetta Spar, MD Primary Gastroenterologist:  Gentry Fitz   Reason for Consultation:  GI bleed   HPI: [name] is a 68 y.o. male with history of hypertension, hyperlipidemia coronary artery disease s/p multiple PCIs, ischemic cardiomyopathy, chronic combined systolic and diastolic CHF, LV mural thrombus, diabetes mellitus type 2, sleep apnea, cocaine abuse, CKD stage III, peripheral vascular disease, Fournie'sr gangrene, meningioma, frequent falls, IDA, GERD, colon polyps and hospitalized d/t a perianal abscess with sepsis 12/2022. He was previously admitted to the hospital for acute CHF and was subsequently discharged to [facility]. He as readmitted to the hospital 1/18 - 02/14/2023 with an acute ischemic CVA with dysphagia. Brain MRI positive for small acute infarct in the left insular white matter.  PEG tube was placed 02/02/2023 and he was subsequently discharged to [facility].  He presented to [facility] ED 02/26/2023 via EMS secondary to passing 6 large maroon bloody stools with associated hypotension and being treated for sepsis secondary to  MSSA/E. Coli bacteremia.  ED showed a WBC 10.5.  Hemoglobin 5.4 down from 8.42 weeks ago.  Hematocrit 18.4.  Platelet 136.  Potassium 5.3..  BUN 104 up from 59.  Creatinine 2.74 up from 1.21.  Albumin less than 1.5.  Total bili 1.0.  Alp phos 325.  AST 82.  ALT 96.  GGT 275.  INR 1.3.  Blood cultures no growth at 12 hours.  SARS coronavirus and influenza A and B negative. Lactic acid 3.8.  Transfused 2 units of PRBCs -> transfusion hemoglobin level 7.6.  Patient is a DNR/DNI.  On ASA 325mg  every day at [facility]. Not on anticoagulation. A GI consult was requested for further evaluation for GI bleed.  He is uncooperative at this time. He stated that he was dying and wanted to be left alone. He is moaning loudly, stated everything hurts.  No family at the bedside.  I  spoke with his dayshift RN and she verified no active GI bleeding overnight or this far this morning. Rectal bag empty at this time.  He remains on Levophed. Daughter [name] is POA, and per discussion with Dr. Elnoria Howard "wanted every thing done" for her father. Daughter lives in [place].   He was previously admitted to [facility] 11/10/2022 with dyspnea and abdominal pain.  Admitted with acute hypoxic respiratory failure secondary to CHF and IDA with heme positive stool.  Hemoglobin 9.3 -> 7.4, transfused 1 unit of PRBCs.  Drug screen positive for cocaine.  He underwent an EGD and colonoscopy 11/11/2022, the EGD showed mild gastritis and duodenitis evidence of H. pylori or malignancy and the colonoscopy resulted in a poor prep and 4 large tubular adenomatous polyps were removed from the colon, one polyp with visible oozing.  A repeat colonoscopy in 1 to 3 months was recommended.  Received IV iron and was started on ferrous sulfate p.o. at time of discharge.  GI PROCEDURES:  EGD 11/21/2022 by Dr. Sanjuan Dame at [facility]: - Normal esophagus. - Erythematous mucosa in the antrum. Biopsied. - Duodenitis. Biopsied.   Colonoscopy 11/11/2022  Dr. Cassandria Santee [facility]: - Preparation of the colon was inadequate.  - One 10 mm polyp in the ascending colon, removed with a hot snare. Resected and retrieved.   - One 25 mm polyp in the transverse colon, removed with mucosal resection. Resected and retrieved. Clips ( MR conditional) were placed. Clip manufacturer: [employer].   -  One 20 mm polyp in the transverse colon, removed with mucosal resection. Resected and retrieved. Clips ( MR conditional) were placed. Clip manufacturer: [employer]. Tattooed.  - One 10 mm polyp in the transverse colon, removed with a hot snare. Resected and retrieved.   - Stool in the entire examined colon.   - Non- bleeding external and internal hemorrhoids.  - Mucosal resection was  performed. Resection and retrieval were complete.  - Mucosal resection was performed. Resection and retrieval were complete. - Repeat colonoscopy in 1 to 3 months  Colonoscopy 2008: 2 small hyperplastic polyps removed. External hemorrhoids.   ECHO 12/21/3032: Left ventricular ejection fraction, by estimation, is 30 to 35%. The left ventricle has moderately decreased function. The left ventricle demonstrates regional wall motion abnormalities (see scoring diagram/findings for description). The left ventricular internal cavity size was mildly dilated. There is moderate asymmetric left ventricular hypertrophy of the septal segment. There is the interventricular septum is flattened in systole and diastole, consistent with right ventricular pressure and volume overload. No evidence of LV thrombus on contrast images. 1. Right ventricular systolic function is normal. The right ventricular size is normal. Tricuspid regurgitation signal is inadequate for assessing PA pressure. 2. The inferior vena cava is dilated in size with <50% respiratory variability, suggesting right atrial pressure of 15 mmHg. 3. Comparison(s): Changes from prior study are noted. LVEF improved from 20-25% to 30-35%.   Past Medical History:  Diagnosis Date   Bulging lumbar disc    CAD (coronary artery disease) [phone]   a. prior LAD stenting. b. s/p DES to [facility] 08/2015. c. 04/2016 Cardiac cath at [facility]. Patent stent in the PLAD and RCA. Diffuse dLAD, OM2, and  RPDA disease. d. DES to PDA and distal RCA 03/2019    Chronic lower back pain    CKD (chronic kidney disease), stage II    Cocaine abuse (HCC)    Cyst of epididymis    DM2 (diabetes mellitus, type 2) (HCC)    Essential hypertension    Fournier gangrene    GERD (gastroesophageal reflux disease)    Headache    History of pneumonia    Hyperlipidemia    Ischemic cardiomyopathy    LV (left ventricular) mural thrombus    Sleep apnea     Past Surgical History:   Procedure Laterality Date   APPENDECTOMY     BIOPSY  11/11/2022   Procedure: BIOPSY;  Surgeon: Franky Macho, MD;  [facility];  Service: Endoscopy;;   BRONCHIAL NEEDLE ASPIRATION BIOPSY  11/12/2021   Procedure: BRONCHIAL NEEDLE ASPIRATION BIOPSIES;  Surgeon: Omar Person, MD;  Location: [facility] ENDOSCOPY;  Service: Pulmonary;;   CARDIAC CATHETERIZATION N/A 09/07/2015   Procedure: Left Heart Cath and Coronary Angiography;  Surgeon: Marykay Lex, MD;  Location: [facility] INVASIVE CV LAB;  Service: Cardiovascular;  Laterality: N/A;   CARDIAC CATHETERIZATION N/A 09/07/2015   Procedure: Coronary Stent Intervention;  Surgeon: Marykay Lex, MD;  Location: [facility] INVASIVE CV LAB;  Service: Cardiovascular;  Laterality: N/A;   COLONOSCOPY WITH PROPOFOL N/A 11/11/2022   Procedure: COLONOSCOPY WITH PROPOFOL;  Surgeon: Franky Macho, MD;  Location: [facility];  Service: Endoscopy;  Laterality: N/A;   CORONARY ANGIOGRAM  09/07/13   residual RCA and OM disease   CORONARY ANGIOPLASTY WITH STENT PLACEMENT     CORONARY STENT INTERVENTION N/A 10/16/2018   Procedure: CORONARY STENT INTERVENTION;  Surgeon: Kathleene Hazel, MD;  Location: [facility] INVASIVE CV LAB;  Service: Cardiovascular;  Laterality: N/A;   CORONARY STENT INTERVENTION N/A 03/11/2019   Procedure: CORONARY STENT INTERVENTION;  Surgeon: Corky Crafts, MD;  Location: [facility] INVASIVE CV LAB;  Service: Cardiovascular;  Laterality: N/A;   ESOPHAGOGASTRODUODENOSCOPY (EGD) WITH PROPOFOL N/A 11/11/2022   Procedure: ESOPHAGOGASTRODUODENOSCOPY (EGD) WITH PROPOFOL;  Surgeon: Franky Macho, MD;  [facility];  Service: Endoscopy;  Laterality: N/A;   ESOPHAGOGASTRODUODENOSCOPY (EGD) WITH PROPOFOL N/A 02/02/2023   Procedure: ESOPHAGOGASTRODUODENOSCOPY (EGD) WITH PROPOFOL;  Surgeon: Franky Macho, MD;  Location: [facility] ORS;  Service: General;  Laterality: N/A;   FRACTURE SURGERY     HEMOSTASIS CLIP PLACEMENT  11/11/2022   Procedure:  HEMOSTASIS CLIP PLACEMENT;  Surgeon: Franky Macho, MD;  Location: [facility];  Service: Endoscopy;;   INCISION AND DRAINAGE OF WOUND Left 05/19/2019   Procedure: DEBRIDEMENT LEFT GROIN;  Surgeon: Violeta Gelinas, MD;  Location: [facility] OR;  Service: General;  Laterality: Left;   INSERTION OF ILIAC STENT Left 11/30/2017   Left external illiac stent   INSERTION OF ILIAC STENT  11/30/2017   Procedure: Insertion Of Iliac Stent;  Surgeon: Runell Gess, MD;  Location: [facility] INVASIVE CV LAB;  Service: Cardiovascular;;  Left external illiac stent   KNEE ARTHROSCOPY Left    KNEE SURGERY     "ligaments, cartilage; tendon, put a pin in" (11/30/2017)   LEFT HEART CATH Bilateral 07/08/2012   Procedure: LEFT HEART CATH;  Surgeon: Corky Crafts, MD;  Location: [facility] CATH LAB;  Service: Cardiovascular;  Laterality: Bilateral;   LEFT HEART CATH AND CORONARY ANGIOGRAPHY N/A 10/16/2018   Procedure: LEFT HEART CATH AND CORONARY ANGIOGRAPHY;  Surgeon: Kathleene Hazel, MD;  Location: [facility] INVASIVE CV LAB;  Service: Cardiovascular;  Laterality: N/A;   LEFT HEART CATH AND CORONARY ANGIOGRAPHY N/A 03/11/2019   Procedure: LEFT HEART CATH AND CORONARY ANGIOGRAPHY;  Surgeon: Corky Crafts, MD;  Location: [facility] INVASIVE CV LAB;  Service: Cardiovascular;  Laterality: N/A;   LEFT HEART CATHETERIZATION WITH CORONARY ANGIOGRAM N/A 09/06/2013   STEMI, 2nd ISR LAD. Procedure: LEFT HEART CATHETERIZATION WITH CORONARY ANGIOGRAM;  Surgeon: Corky Crafts, MD;  Location: [facility] CATH LAB;  Service: Cardiovascular;  Laterality: N/A;   LOWER EXTREMITY ANGIOGRAPHY N/A 11/30/2017   Procedure: LOWER EXTREMITY ANGIOGRAPHY;  Surgeon: Runell Gess, MD;  Location: [facility] INVASIVE CV LAB;  Service: Cardiovascular;  Laterality: N/A;   PEG PLACEMENT N/A 02/02/2023   Procedure: PERCUTANEOUS ENDOSCOPIC GASTROSTOMY (PEG) PLACEMENT;  Surgeon: Franky Macho, MD;  Location: [facility] ORS;  Service: General;  Laterality: N/A;   PERCUTANEOUS  CORONARY STENT INTERVENTION (PCI-S)  07/08/2012   Procedure: PERCUTANEOUS CORONARY STENT INTERVENTION (PCI-S);  Surgeon: Corky Crafts, MD;  Location: [facility] CATH LAB;  Service: Cardiovascular;;  DES LAD   PERCUTANEOUS CORONARY STENT INTERVENTION (PCI-S) N/A 09/06/2013   Procedure: PERCUTANEOUS CORONARY STENT INTERVENTION (PCI-S);  Surgeon: Corky Crafts, MD;  Location: [facility] CATH LAB;  Service: Cardiovascular;  Laterality: N/A;  Mid LAD 3.0/24mm Promus   POLYPECTOMY  11/11/2022   Procedure: POLYPECTOMY INTESTINAL;  Surgeon: Franky Macho, MD;  [facility];  Service: Endoscopy;;   SCLEROTHERAPY  11/11/2022   Procedure: Susa Day;  Surgeon: Franky Macho, MD;  [facility];  Service: Endoscopy;;   SUBMUCOSAL TATTOO INJECTION  11/11/2022   Procedure: SUBMUCOSAL TATTOO INJECTION;  Surgeon: Franky Macho, MD;  [facility];  Service: Endoscopy;;   THORACENTESIS Right 11/10/2021   Procedure: Alanson Puls;  Surgeon: Leslye Peer, MD;  Location: [facility]  ENDOSCOPY;  Service: Cardiopulmonary;  Laterality: Right;   VIDEO BRONCHOSCOPY WITH ENDOBRONCHIAL ULTRASOUND Right 11/12/2021   Procedure: VIDEO BRONCHOSCOPY WITH ENDOBRONCHIAL ULTRASOUND;  Surgeon: Omar Person, MD;  Location: [facility] ENDOSCOPY;  Service: Pulmonary;  Laterality: Right;   WOUND DEBRIDEMENT Left 05/20/2019   Procedure: DEBRIDEMENT GROIN;  Surgeon: Kinsinger, De Blanch, MD;  Location: [facility] OR;  Service: General;  Laterality: Left;   WRIST FRACTURE SURGERY Bilateral     Prior to Admission medications   Medication Sig Start Date End Date Taking? Authorizing Provider  acetaminophen (TYLENOL) 325 MG tablet Take 2 tablets (650 mg total) by mouth every 4 (four) hours as needed for mild pain (pain score 1-3) (or temp > 37.5 C (99.5 F)). 02/14/23  Yes Vassie Loll, MD  atorvastatin (LIPITOR) 40 MG tablet Place 1 tablet (40 mg total) into feeding tube at bedtime. 02/14/23  Yes Vassie Loll, MD   dexamethasone (DECADRON) 4 MG/ML injection Inject 0.25 mLs (1 mg total) into the vein every 12 (twelve) hours. 02/14/23  Yes Vassie Loll, MD  ferrous sulfate 300 (60 Fe) MG/5ML syrup Place 5 mLs (300 mg total) into feeding tube daily. 02/15/23  Yes Vassie Loll, MD  folic acid (FOLVITE) 1 MG tablet Place 1 tablet (1 mg total) into feeding tube daily. 02/15/23  Yes Vassie Loll, MD  gabapentin (NEURONTIN) 250 MG/5ML solution Place 2 mLs (100 mg total) into feeding tube at bedtime. 02/14/23  Yes Vassie Loll, MD  heparin 5000 UNIT/ML injection Inject 5,000 Units into the skin every 8 (eight) hours.   Yes [provider]  hydrALAZINE (APRESOLINE) 25 MG tablet Place 1 tablet (25 mg total) into feeding tube 2 (two) times daily. 02/14/23  Yes Vassie Loll, MD  HYDROmorphone (DILAUDID) 1 MG/ML injection Inject 0.5 mLs (0.5 mg total) into the vein every 6 (six) hours as needed for severe pain (pain score 7-10). 02/14/23  Yes Vassie Loll, MD  insulin aspart (NOVOLOG) 100 UNIT/ML injection Inject 3 Units into the skin every 4 (four) hours. Patient taking differently: Inject 1 Units into the skin every 6 (six) hours. 02/14/23  Yes Vassie Loll, MD  insulin glargine-yfgn (SEMGLEE) 100 UNIT/ML injection Inject 0.16 mLs (16 Units total) into the skin 2 (two) times daily. 02/14/23  Yes Vassie Loll, MD  isosorbide dinitrate (ISORDIL) 5 MG tablet Place 1 tablet (5 mg total) into feeding tube 2 (two) times daily. 02/14/23  Yes Vassie Loll, MD  LORazepam (ATIVAN) 2 MG/ML injection Inject 0.5 mLs (1 mg total) into the vein every 8 (eight) hours as needed for anxiety or sedation (agitation). Patient taking differently: Inject 1 mg into the vein every 6 (six) hours as needed for anxiety or sedation (agitation). 02/14/23  Yes Vassie Loll, MD  metoCLOPramide (REGLAN) 5 MG/ML injection Inject 2 mLs (10 mg total) into the vein every 8 (eight) hours. 02/14/23  Yes Vassie Loll, MD  metoprolol tartrate (LOPRESSOR)  25 MG tablet Place 1 tablet (25 mg total) into feeding tube 2 (two) times daily. 02/14/23  Yes Vassie Loll, MD  polyethylene glycol (MIRALAX / GLYCOLAX) 17 g packet Place 17 g into feeding tube daily as needed for mild constipation. 02/14/23  Yes Vassie Loll, MD  aspirin 325 MG tablet Place 1 tablet (325 mg total) into feeding tube daily. 02/15/23   Vassie Loll, MD  insulin aspart (NOVOLOG) 100 UNIT/ML injection Inject 0-20 Units into the skin every 4 (four) hours. 02/14/23   Vassie Loll, MD  ipratropium-albuterol (DUONEB) 0.5-2.5 (3) MG/3ML SOLN Take  3 mLs by nebulization every 6 (six) hours as needed. Patient taking differently: Take 3 mLs by nebulization every 6 (six) hours as needed (for shortness of breath/wheezing). 02/14/23   Vassie Loll, MD  linaclotide [facility]) 145 MCG CAPS capsule Take 1 capsule (145 mcg total) by mouth daily before breakfast. 12/24/22   Tat, Onalee Hua, MD  Nutritional Supplements (FEEDING SUPPLEMENT, OSMOLITE 1.5 CAL,) LIQD Place 1,000 mLs into feeding tube continuous. 60 mL/h continuous infusion 02/14/23   Vassie Loll, MD  pantoprazole (PROTONIX) 40 MG tablet Take 1 tablet (40 mg total) by mouth daily. Per tube 02/14/23   Vassie Loll, MD  prochlorperazine (COMPAZINE) 10 MG tablet Take 1 tablet (10 mg total) by mouth every 6 (six) hours as needed for nausea or vomiting. 01/21/23   Dione Booze, MD  Protein (FEEDING SUPPLEMENT, PROSOURCE TF20,) liquid Place 60 mLs into feeding tube 2 (two) times daily. 02/14/23   Vassie Loll, MD  torsemide (DEMADEX) 20 MG tablet Take 1 tablet (20 mg total) by mouth daily. 2 02/14/23   Vassie Loll, MD  Water For Irrigation, Sterile (FREE WATER) SOLN Place 200 mLs into feeding tube every 4 (four) hours. 02/14/23   Vassie Loll, MD    Current Facility-Administered Medications  Medication Dose Route Frequency Provider Last Rate Last Admin   0.9 %  sodium chloride infusion  250 mL Intravenous Continuous Terald Sleeper, MD 20 mL/hr at  02/27/23 0700 Infusion Verify at 02/27/23 0700   Chlorhexidine Gluconate Cloth 2 % PADS 6 each  6 each Topical Daily Briant Sites, DO       docusate (COLACE) 50 MG/5ML liquid 100 mg  100 mg Per Tube BID PRN Briant Sites, DO       insulin aspart (novoLOG) injection 0-15 Units  0-15 Units Subcutaneous Q4H Briant Sites, DO   2 Units at 02/27/23 0220   mupirocin ointment (BACTROBAN) 2 % 1 Application  1 Application Nasal BID Briant Sites, DO       norepinephrine (LEVOPHED) 4mg  in (0.016 mg/mL) premix infusion  2-10 mcg/min Intravenous Titrated Terald Sleeper, MD 30 mL/hr at 02/27/23 0700 8 mcg/min at 02/27/23 0700   pantoprazole (PROTONIX) injection 40 mg  40 mg Intravenous Q8H Terald Sleeper, MD   40 mg at 02/27/23 0645   Followed by   Melene Muller ON 03/02/2023] pantoprazole (PROTONIX) injection 40 mg  40 mg Intravenous Q12H Terald Sleeper, MD       polyethylene glycol (MIRALAX / GLYCOLAX) packet 17 g  17 g Per Tube Daily PRN Briant Sites, DO        Allergies as of 02/26/2023 - Unable to Assess 02/26/2023  Allergen Reaction Noted   Clopidogrel Other (See Comments) 04/16/2016    Family History  Problem Relation Age of Onset   Hypertension Mother    Diabetes Mother     Social History   Socioeconomic History   Marital status: Divorced    Spouse name: Not on file   Number of children: 3   Years of education: 12   Highest education level: 12th grade  Occupational History   Not on file  Tobacco Use   Smoking status: Every Day    Current packs/day: 0.50    Average packs/day: 0.5 packs/day for 48.0 years (24.0 ttl pk-yrs)    Types: Cigarettes    Passive exposure: Current   Smokeless tobacco: Never   Tobacco comments:    Smoking Cessation Classes, Agencies, Services & Resources Offered.  Vaping Use  Vaping status: Never Used  Substance and Sexual Activity   Alcohol use: Not Currently    Comment: 11/30/2017 "might drink a beer q 6 months"   Drug  use: Not Currently    Types: Cocaine   Sexual activity: Not Currently    Partners: Female  Other Topics Concern   Not on file  Social History Narrative   ** Merged History Encounter **       Social Drivers of Health   Financial Resource Strain: Low Risk  (02/14/2023)   Overall Financial Resource Strain (CARDIA)    Difficulty of Paying Living Expenses: Not hard at all  Food Insecurity: Patient Unable To Answer (02/27/2023)   Hunger Vital Sign    Worried About Running Out of Food in the Last Year: Patient unable to answer    Ran Out of Food in the Last Year: Patient unable to answer  Transportation Needs: Patient Unable To Answer (02/27/2023)   PRAPARE - Transportation    Lack of Transportation (Medical): Patient unable to answer    Lack of Transportation (Non-Medical): Patient unable to answer  Physical Activity: Sufficiently Active (02/14/2023)   Exercise Vital Sign    Days of Exercise per Week: 5 days    Minutes of Exercise per Session: 50 min  Recent Concern: Physical Activity - Inactive (12/14/2022)   Exercise Vital Sign    Days of Exercise per Week: 0 days    Minutes of Exercise per Session: 0 min  Stress: No Stress Concern Present (02/14/2023)   [employer] of Occupational Health - Occupational Stress Questionnaire    Feeling of Stress : Not at all  Recent Concern: Stress - Stress Concern Present (12/14/2022)   [employer] of Occupational Health - Occupational Stress Questionnaire    Feeling of Stress : To some extent  Social Connections: Patient Unable To Answer (02/27/2023)   Social Connection and Isolation Panel [NHANES]    Frequency of Communication with Friends and Family: Patient unable to answer    Frequency of Social Gatherings with Friends and Family: Patient unable to answer    Attends Religious Services: Patient unable to answer    Active Member of Clubs or Organizations: Patient unable to answer    Attends Banker Meetings: Patient unable to  answer    Marital Status: Patient unable to answer  Intimate Partner Violence: Patient Unable To Answer (02/27/2023)   Humiliation, Afraid, Rape, and Kick questionnaire    Fear of Current or Ex-Partner: Patient unable to answer    Emotionally Abused: Patient unable to answer    Physically Abused: Patient unable to answer    Sexually Abused: Patient unable to answer   Review of Systems: Unable to obtain review of systems as patient is confused and uncooperative.  No family at the bedside.  Physical Exam: Vital signs in last 24 hours: Temp:  [93.7 F (34.3 C)-98.4 F (36.9 C)] 98.2 F (36.8 C) (02/17 0330) Pulse Rate:  [42-120] 97 (02/17 0700) Resp:  [15-33] 18 (02/17 0700) BP: (64-162)/(40-145) 107/62 (02/17 0700) SpO2:  [91 %-100 %] 94 % (02/17 0700) Last BM Date : 02/27/23 General: Patient is conversant, hollering out loud, stated he was dying.   Head:  Normocephalic and atraumatic. Eyes:  No scleral icterus. Conjunctiva pink. Ears:  Normal auditory acuity. Nose:  No deformity, discharge or lesions. Mouth: Poor dentition. No ulcers or lesions.  Neck:  Supple. No lymphadenopathy or thyromegaly.  Lungs: Coarse breath sounds throughout. Heart: Tachycardic, no murmur. Abdomen: Soft, protuberant.  No obvious tenderness or rebound or guarding.  Positive bowel sounds all 4 quadrants.  No palpable mass.  No bruit. Peg tube clamped, site.intact. Rectal: Patient turned.  On the right side, rectal bag empty without blood or stool at this time. Musculoskeletal:  Symmetrical without gross deformities.  Pulses:  Normal pulses noted. Extremities: Bilateral ankle edema. Neurologic: Patient is awake and somewhat conversant.  He stated he was in the hospital. Skin: Sacral DuoDERM intact.  Intake/Output from previous day: 02/16 0701 - 02/17 0700 In: 2850.3 [I.V.:140.9; Blood:1376.7; IV Piggyback:1332.7] Out: 1140 [Urine:1140] Intake/Output this shift: No intake/output data recorded.  Lab  Results: Recent Labs    02/26/23 2205 02/26/23 2219 02/27/23 0320 02/27/23 0536  WBC 18.5*  --  17.5* 18.8*  HGB 5.4* 6.1* 7.7* 7.6*  HCT 18.4* 18.0* 26.0* 24.1*  PLT 136*  --  107* 125*   BMET Recent Labs    02/26/23 2205 02/26/23 2219 02/27/23 0536  NA 136 135 137  K 5.3* 5.3* 5.1  CL 108 108 108  CO2 16*  --  15*  GLUCOSE 185* 177* 123*  BUN 104* 104* 102*  CREATININE 2.74* 3.20* 2.53*  CALCIUM 8.1*  --  8.4*   LFT Recent Labs    02/27/23 0536  PROT 3.8*  ALBUMIN <1.5*  AST 80*  ALT 89*  ALKPHOS 305*  BILITOT 2.0*   PT/INR Recent Labs    02/26/23 2205  LABPROT 16.7*  INR 1.3*   Hepatitis Panel No results for input(s): "HEPBSAG", "HCVAB", "HEPAIGM", "HEPBIGM" in the last 72 hours.    Studies/Results: DG Chest Port 1 View Result Date: 02/26/2023 CLINICAL DATA:  Sepsis EXAM: PORTABLE CHEST 1 VIEW COMPARISON:  01/30/2023 FINDINGS: Stable pulmonary insufflation. Small right pleural effusion. Progressive perihilar pulmonary infiltrate most in keeping with perihilar pulmonary edema. Stable cardiomegaly. No pneumothorax. No acute bone abnormality. IMPRESSION: 1. Cardiomegaly with progressive perihilar pulmonary edema and small right pleural effusion. Electronically Signed   By: Helyn Numbers M.D.   On: 02/26/2023 22:25    IMPRESSION/PLAN:  68 year old male with hematochezia with acute on chronic anemia admitted with hemorrhagic +/- septic shock. Admission hemoglobin 5.4, transfused 2 units of PRBC -> Hg 7.7 -> today Hg 7.6.  No hematochezia overnight. Remains on Levophed. Prior EGD 11/2022 showed mild gastritis and duodenitis without evidence of H. pylori or malignancy. Colonoscopy resulted in a poor prep and 4 large tubular adenomatous polyps were removed from the colon, one polyp with visible oozing.   -NPO, tube feeds on hold # -IV fluids and pain management per the hospitalist -Transfuse for hemoglobin less than 8 and as needed if symptomatic -Continue to  monitor patient closely for active GI  bleeding -Endoscopic evaluation deferred for now, to reconsider if patient's hemoglobin significantly drops or if he demonstrates significant active GI bleeding.  -Continue Pantoprazole 1 mg IV every 8 hours -Await further recommendations by Dr. Leonides Schanz  MSSA/Ecoli bacteremia/sepsis Zosyn IV.  Elevated LFTs, possible shock liver.  CTAP without contrast 01/21/2023 showed a somewhat nodular liver, suggestive of cirrhosis.  No evidence of esophageal varices per EGD 11/2022. -Hepatic panel in a.m. -RUQ sonogram when patient hemodynamically stable  Acute CVA, left hemiparesis, dysphagia s/p PEG  CAD s/p multiple PCIs  Chronic combined systolic and diastolic CHF  Colon polyps. Colonoscopy 11/2022 identified 4 large tubular adenomatous polyps were removed from the colon, one polyp with visible oozing.  A repeat colonoscopy in 1 to 3 months was recommended as an outpatient.    CKD  stage III  DM type II   Arnaldo Natal  02/27/2023, 10:20 AM

## 2023-02-27 NOTE — Progress Notes (Signed)
  Echocardiogram 2D Echocardiogram has been performed.  [name] 02/27/2023, 1:00 PM

## 2023-02-27 NOTE — Progress Notes (Signed)
Pt is refusing to let any one try to get labs, tried to scratch nurse and phlebotomy. Nurse educated pt on the need for labs, the pt refused. Nurse notified elink

## 2023-02-27 NOTE — Plan of Care (Signed)
   Problem: Coping: Goal: Level of anxiety will decrease Outcome: Progressing   Problem: Elimination: Goal: Will not experience complications related to urinary retention Outcome: Progressing   Problem: Pain Managment: Goal: General experience of comfort will improve and/or be controlled Outcome: Progressing   Problem: Safety: Goal: Ability to remain free from injury will improve Outcome: Progressing   Problem: Skin Integrity: Goal: Risk for impaired skin integrity will decrease Outcome: Progressing

## 2023-02-27 NOTE — Progress Notes (Addendum)
Cultures from [facility] reviewed with pharmacy- E coli in urine, staph from sputum. Neither in blood, cultures will finalize tomorrow.   Steffanie Dunn, DO 02/27/23 4:23 PM [facility]  For contact information, see Amion. If no response to pager, please call PCCM consult pager. After hours, 7PM- 7AM, please call Elink.   No CBC since this morning; received 1 unit of blood for pressor requirements increasing oversight. Now has CVC; has previously been a difficult stick. D/w RN collecting a CBC now.   Steffanie Dunn, DO 02/27/23 6:21 PM [facility]

## 2023-02-27 NOTE — Progress Notes (Addendum)
eLink Physician-Brief Progress Note Patient Name: [name] DOB: December 30, 1955 MRN: [id number]   Date of Service  02/27/2023  HPI/Events of Note  Pt has been hypoglycemic most of the day requiring D50 pushes.  Most recent CBG at 64, improving to 83 after 1/2 amp of D50.   eICU Interventions  Start on D5LR at 50cc/hr.      Intervention Category Minor Interventions: Other:  Larinda Buttery 02/27/2023, 9:21 PM  11:30 PM Cdiff came back negative.   Plan> Enteric precautions discontinued.

## 2023-02-27 NOTE — Progress Notes (Addendum)
PHARMACY ANTIBIOTIC CONSULT NOTE   [name] a 68 y.o. male admitted on 02/27/23 from [facility] with GI bleed and bacteremia per chart review (no cultures to confirm).  Pharmacy has been consulted for Zosyn dosing.  02/27/23: Scr 2.53 (down from 3.2), LA 2.3,  WBC 18.8  Vital Signs: Tm 98.4, HR 93, MAP 70s on NE@8   At [facility], patient was started on empiric vancomycin at (VR 2/16 @0413  = 24.1 mcg/mL; timing of dosing not in faxed records) and zosyn, later transitioned to Merrem/Levaquin due to persistent leukocytosis on Zosyn.   Patient received one-time Zosyn dose and re-loaded with vancomycin 2g 2/16 PM in the ED and now refusing lab draws.  Plan to transition to Linezolid for ease of monitoring.  Urine culture / sputum cultures faxed (see below for details) showing MRSA, MDR E coli PNA and MDR E. Coli UTI.  Estimated Creatinine Clearance: 30.9 mL/min (A) (by C-G formula based on SCr of 2.53 mg/dL (H)).  Plan: STOP vancomycin START Linezolid IV 600 mg Q12h START Zosyn IV 3.375 g Q8h Monitor renal function, clinical status, C/S, de-escalation   Allergies:  Allergies  Allergen Reactions   Clopidogrel Other (See Comments)    Drowsy, Skin irritation    There were no vitals filed for this visit.     Latest Ref Rng & Units 02/27/2023    5:36 AM 02/27/2023    3:20 AM 02/26/2023   10:19 PM  CBC  WBC 4.0 - 10.5 K/uL 18.8  17.5    Hemoglobin 13.0 - 17.0 g/dL 7.6  7.7  6.1   Hematocrit 39.0 - 52.0 % 24.1  26.0  18.0   Platelets 150 - 400 K/uL 125  107      Antibiotics Given (last 72 hours)     Date/Time Action Medication Dose Rate   02/26/23 2350 New Bag/Given   vancomycin (VANCOREADY) IVPB 2000 mg/400 mL 2,000 mg 200 mL/hr   02/26/23 2350 New Bag/Given   piperacillin-tazobactam (ZOSYN) IVPB 3.375 g 3.375 g 100 mL/hr   02/27/23 1057 New Bag/Given   piperacillin-tazobactam (ZOSYN) IVPB 3.375 g 3.375 g 12.5 mL/hr       Antimicrobials this admission: Zosyn  2/16 >  Vancomycin 2/16 x1 Linezolid 2/17 > c  Microbiology results: 2/12 BCx (from [facility]): NG x48h - finalized results to be updated 2/18 per [facility] 2/12 Ucx (from [facility]): MDR E. Coli (S: cipro, ertapenem, meropenem, Zosyn, Macrobid; I: Levaquin) 2/12 sputum (from [facility]): MRSA, MDR E. Coli (S: cipro, ertapenem, meropenem, zosyn) 2/17 Bcx: NG < 12h 2/17 MRSA PCR: positive  Thank you for allowing pharmacy to be a part of this patient's care.  Trixie Rude, PharmD Clinical Pharmacist 02/27/2023  3:39 PM

## 2023-02-27 NOTE — ED Notes (Signed)
Dr. Renaye Rakers spoke with daughter who consented for emergent blood transfusion.

## 2023-02-27 NOTE — Progress Notes (Signed)
GI NP secure chatted regarding patient's blood in stool.  RN will continue to monitor patient closely.

## 2023-02-27 NOTE — Progress Notes (Addendum)
NAME:  [name], MRN:  [id number], DOB:  14-Feb-1955, LOS: 0 ADMISSION DATE:  02/26/2023, CONSULTATION DATE:  2331 REFERRING MD:  EDP, CHIEF COMPLAINT:  acute GIB   History of Present Illness:  [name] presented after being actively treated for sepsis (MSSA/Ecoli and bacteremia, diagnosed and treated at [facility]) , recent discharged from [facility] on 02/14/23. He had an episode of hypotension after a large melanotic stool earlier today. Per report from EDP, as pt is aphasic 2/2 CVA, [facility] (his [facility]) reported that pt was improving from a septic standpoint until today. Pt is on no chronic a/c but is on ASA per his peg tube. His facility reportedly started volume resuscitation while awaiting transport to ED. Pt had mild improvement in BP but has ultimately required initiation of vasopressors via piv. His hgb was found to be 6.1. EDP ordered 2 U PRBC at this time and is contacting GI for consultation.   Recent PEG placement 02/02/23. Recent c-scope (inadequate prep) in 11/2022. Had multiple poolyps removed, as large as 25mm.   All history is obtained from chart review as pt is unable to provide any 2/2 baseline status.   Ccm was asked to admit 2/2 need for vasopressors at this time.   Pertinent  Medical History  H/o cva with resultant aphasia, dysphagia and bedbound status Advanced dementia Htn Hyperlipidemia T2dm with hyperglycemia Ckd2 gerd   Significant Hospital Events: Including procedures, antibiotic start and stop dates in addition to other pertinent events   Admitted to ICU 2/16. 2 units pRBC given at admission.   Interim History / Subjective:   Screaming in pain this morning.  Afebrile overnight.  Refused labs overnight, was better after dilaudid.   Objective   Blood pressure 107/62, pulse 97, temperature 98.1 F (36.7 C), temperature source Oral, resp. rate 18, SpO2 94%.        Intake/Output Summary (Last 24 hours) at 02/27/2023 0845 Last data filed at 02/27/2023  0700 Gross per 24 hour  Intake 2850.31 ml  Output 1140 ml  Net 1710.31 ml   There were no vitals filed for this visit.  Examination: General: Chronically ill-appearing screaming, stops to answer questions. HENT: [place]/AT, eyes anicteric Lungs: Breathing comfortably on nasal cannula, CTAB Cardiovascular: S1-S2, regular rate and rhythm Abdomen: Obese, soft, nontender.  PEG tube in place without bleeding or erythema Extremities: Mild peripheral edema Neuro: Awake, answering some questions, confused GU: Foley w/ bloody urine  LA 3.8>2.3 Bicarb 15 BUN 102 Cr 2.53 Phos 5.1 AST 80 ALT 89 T bili 2 WBC 18.8 H/H 7.6/24.1 Platelets 125 UA: 21-50 WBC CXR personally reviewed> midlung infiltrates bilaterally, increased interstitial markings   Resolved Hospital Problem list   Hyperkalemia  Assessment & Plan:  Acute GIB Acute blood loss anemia on chronic anemia Hemorrhagic shock with lactic acidosis -cbc q6h -transfuse for Hb <7 or hemodynamically significant bleeding; additional 1 unit now with increasing vasopressor requirements -picc line for frequent labs since he has not been agreeable to lab draws -PPI BID -GI consulted; appreciate their management -hold chemical DVT prophylaxis and aspirin -wean NE to maintain MAP >65; may benefit from vaso once we get PICC  H/o cva with chronic deficits, PEG dependent -hold TF for now with GIB -hold aspirin -con't statin  MSSA/ecoli bacteremia, subacute- present on admission -con't antibiotics- vanc, zosyn until we can get records from [facility]  Metabolic acidosis with Lactic acidosis AKI on CKD2; mildly improved.  -strict I/O -renally dose meds, avoid nephrotoxic meds -maintain  adequate perfusion  Dm2 with Hyperglycemia; A1c 7.2 -SSI PRN Q4h -goal BG 140-180  Chronic combined systolic & diastolic HF -Holding GDMT while hypotensive. Coreg stopped during last admission. Hold isordil, hydralazine, metoprolol torsemide -Telemetry  monitoring  Elevated transaminases Elevated alk phos Hyperbilirubinemia due to acute GIB -supprotive care, monitor  Moderate protein energy malnutrition -hold TF pending GI evaluation  Intractable pain -oxycodone, dilaudid PRN  Dysphagia -last seen by SLP 02/10/23, recommended NPO -when more stable will reconsult  Best Practice (right click and "Reselect all SmartList Selections" daily)   Diet/type: NPO DVT prophylaxis SCD Pressure ulcer(s): present on admission  see RN assessment GI prophylaxis: PPI Lines: N/A Foley:  Yes, and it is still needed Code Status:  DNR but ok with intubation, pressors etc Last date of multidisciplinary goals of care discussion [pending family discussion]  Labs   CBC: Recent Labs  Lab 02/26/23 2205 02/26/23 2219 02/27/23 0320 02/27/23 0536  WBC 18.5*  --  17.5* 18.8*  NEUTROABS 17.8*  --  16.5* 17.8*  HGB 5.4* 6.1* 7.7* 7.6*  HCT 18.4* 18.0* 26.0* 24.1*  MCV 81.1  --  88.7 81.1  PLT 136*  --  107* 125*    Basic Metabolic Panel: Recent Labs  Lab 02/26/23 2205 02/26/23 2219 02/27/23 0536  NA 136 135 137  K 5.3* 5.3* 5.1  CL 108 108 108  CO2 16*  --  15*  GLUCOSE 185* 177* 123*  BUN 104* 104* 102*  CREATININE 2.74* 3.20* 2.53*  CALCIUM 8.1*  --  8.4*  MG  --   --  1.8  PHOS  --   --  5.1*   GFR: Estimated Creatinine Clearance: 30.9 mL/min (A) (by C-G formula based on SCr of 2.53 mg/dL (H)). Recent Labs  Lab 02/26/23 2205 02/26/23 2220 02/27/23 0320 02/27/23 0536  WBC 18.5*  --  17.5* 18.8*  LATICACIDVEN  --  3.8*  --  2.3*    Liver Function Tests: Recent Labs  Lab 02/26/23 2205 02/27/23 0536  AST 82* 80*  ALT 96* 89*  ALKPHOS 325* 305*  BILITOT 1.0 2.0*  PROT 5.2* 3.8*  ALBUMIN <1.5* <1.5*   No results for input(s): "LIPASE", "AMYLASE" in the last 168 hours. No results for input(s): "AMMONIA" in the last 168 hours.  ABG    Component Value Date/Time   PHART 7.45 02/04/2023 1329   PCO2ART 48 02/04/2023  1329   PO2ART 70 (L) 02/04/2023 1329   HCO3 33.4 (H) 02/04/2023 1329   TCO2 16 (L) 02/26/2023 2219   ACIDBASEDEF 3.6 (H) 01/28/2023 1022   O2SAT 95.7 02/04/2023 1329     Critical care time:        This patient is critically ill with multiple organ system failure which requires frequent high complexity decision making, assessment, support, evaluation, and titration of therapies. This was completed through the application of advanced monitoring technologies and extensive interpretation of multiple databases. During this encounter critical care time was devoted to patient care services described in this note for 37 minutes.  Steffanie Dunn, DO 02/27/23 10:32 AM [facility]  For contact information, see Amion. If no response to pager, please call PCCM consult pager. After hours, 7PM- 7AM, please call Elink.

## 2023-02-27 NOTE — Progress Notes (Signed)
Hypoglycemic Event  CBG: 62  Treatment: D50 25 mL (12.5 gm)  Symptoms: Nervous/irritable  Follow-up CBG: Time:1637 CBG Result: 91  Possible Reasons for Event: Inadequate meal intake    [name]

## 2023-02-28 ENCOUNTER — Inpatient Hospital Stay (HOSPITAL_COMMUNITY): Payer: No Typology Code available for payment source

## 2023-02-28 DIAGNOSIS — Z1612 Extended spectrum beta lactamase (ESBL) resistance: Secondary | ICD-10-CM

## 2023-02-28 DIAGNOSIS — K922 Gastrointestinal hemorrhage, unspecified: Secondary | ICD-10-CM | POA: Diagnosis not present

## 2023-02-28 DIAGNOSIS — K921 Melena: Secondary | ICD-10-CM | POA: Diagnosis not present

## 2023-02-28 DIAGNOSIS — Z7189 Other specified counseling: Secondary | ICD-10-CM

## 2023-02-28 DIAGNOSIS — B9620 Unspecified Escherichia coli [E. coli] as the cause of diseases classified elsewhere: Secondary | ICD-10-CM | POA: Diagnosis not present

## 2023-02-28 DIAGNOSIS — A0811 Acute gastroenteropathy due to Norwalk agent: Secondary | ICD-10-CM

## 2023-02-28 DIAGNOSIS — R579 Shock, unspecified: Secondary | ICD-10-CM

## 2023-02-28 DIAGNOSIS — E440 Moderate protein-calorie malnutrition: Secondary | ICD-10-CM

## 2023-02-28 DIAGNOSIS — R578 Other shock: Secondary | ICD-10-CM

## 2023-02-28 DIAGNOSIS — J189 Pneumonia, unspecified organism: Secondary | ICD-10-CM

## 2023-02-28 DIAGNOSIS — Z66 Do not resuscitate: Secondary | ICD-10-CM

## 2023-02-28 DIAGNOSIS — Z515 Encounter for palliative care: Secondary | ICD-10-CM | POA: Diagnosis not present

## 2023-02-28 DIAGNOSIS — R571 Hypovolemic shock: Secondary | ICD-10-CM | POA: Diagnosis not present

## 2023-02-28 DIAGNOSIS — R7881 Bacteremia: Secondary | ICD-10-CM | POA: Diagnosis not present

## 2023-02-28 DIAGNOSIS — G9341 Metabolic encephalopathy: Secondary | ICD-10-CM

## 2023-02-28 DIAGNOSIS — N390 Urinary tract infection, site not specified: Secondary | ICD-10-CM

## 2023-02-28 LAB — GASTROINTESTINAL PANEL BY PCR, STOOL (REPLACES STOOL CULTURE)

## 2023-02-28 LAB — BLOOD CULTURE ID PANEL (REFLEXED) - BCID2

## 2023-02-28 LAB — CBC
HCT: 26 % — ABNORMAL LOW (ref 39.0–52.0)
HCT: 24.8 % — ABNORMAL LOW (ref 39.0–52.0)
Hemoglobin: 8.3 g/dL — ABNORMAL LOW (ref 13.0–17.0)
Hemoglobin: 7.9 g/dL — ABNORMAL LOW (ref 13.0–17.0)
MCH: 25.7 pg — ABNORMAL LOW (ref 26.0–34.0)
MCH: 25.7 pg — ABNORMAL LOW (ref 26.0–34.0)
MCHC: 31.9 g/dL (ref 30.0–36.0)
MCHC: 31.9 g/dL (ref 30.0–36.0)
MCV: 80.5 fL (ref 80.0–100.0)
MCV: 80.8 fL (ref 80.0–100.0)
Platelets: 101 10*3/uL — ABNORMAL LOW (ref 150–400)
Platelets: 99 10*3/uL — ABNORMAL LOW (ref 150–400)
RBC: 3.23 MIL/uL — ABNORMAL LOW (ref 4.22–5.81)
RBC: 3.07 MIL/uL — ABNORMAL LOW (ref 4.22–5.81)
RDW: 22.5 % — ABNORMAL HIGH (ref 11.5–15.5)
RDW: 22.6 % — ABNORMAL HIGH (ref 11.5–15.5)
WBC: 23.5 10*3/uL — ABNORMAL HIGH (ref 4.0–10.5)
WBC: 24.3 10*3/uL — ABNORMAL HIGH (ref 4.0–10.5)
nRBC: 0.4 % — ABNORMAL HIGH (ref 0.0–0.2)
nRBC: 0.4 % — ABNORMAL HIGH (ref 0.0–0.2)

## 2023-02-28 LAB — GLUCOSE, CAPILLARY
Glucose-Capillary: 80 mg/dL (ref 70–99)
Glucose-Capillary: 69 mg/dL — ABNORMAL LOW (ref 70–99)
Glucose-Capillary: 102 mg/dL — ABNORMAL HIGH (ref 70–99)
Glucose-Capillary: 104 mg/dL — ABNORMAL HIGH (ref 70–99)
Glucose-Capillary: 97 mg/dL (ref 70–99)

## 2023-02-28 LAB — BASIC METABOLIC PANEL
Anion gap: 14 (ref 5–15)
BUN: 83 mg/dL — ABNORMAL HIGH (ref 8–23)
CO2: 16 mmol/L — ABNORMAL LOW (ref 22–32)
Calcium: 8.2 mg/dL — ABNORMAL LOW (ref 8.9–10.3)
Chloride: 111 mmol/L (ref 98–111)
Creatinine, Ser: 2.14 mg/dL — ABNORMAL HIGH (ref 0.61–1.24)
GFR, Estimated: 33 mL/min — ABNORMAL LOW (ref >60)
Glucose, Bld: 82 mg/dL (ref 70–99)
Potassium: 4.5 mmol/L (ref 3.5–5.1)
Sodium: 141 mmol/L (ref 135–145)

## 2023-02-28 LAB — HEPATIC FUNCTION PANEL
ALT: 68 U/L — ABNORMAL HIGH (ref 0–44)
AST: 62 U/L — ABNORMAL HIGH (ref 15–41)
Albumin: <1.5 g/dL — ABNORMAL LOW (ref 3.5–5.0)
Alkaline Phosphatase: 245 U/L — ABNORMAL HIGH (ref 38–126)
Bilirubin, Direct: 0.7 mg/dL — ABNORMAL HIGH (ref 0.0–0.2)
Indirect Bilirubin: 0.7 mg/dL (ref 0.3–0.9)
Total Bilirubin: 1.4 mg/dL — ABNORMAL HIGH (ref 0.0–1.2)
Total Protein: 5 g/dL — ABNORMAL LOW (ref 6.5–8.1)

## 2023-02-28 LAB — AMMONIA: Ammonia: 28 umol/L (ref 9–35)

## 2023-02-28 LAB — MAGNESIUM: Magnesium: 2.2 mg/dL (ref 1.7–2.4)

## 2023-02-28 MED ORDER — SODIUM CHLORIDE 0.9 % IV SOLN
100.0000 mg | INTRAVENOUS | Status: DC
  Administered 2023-02-28 – 2023-03-02 (×3): 100 mg via INTRAVENOUS
  Filled 2023-02-28 (×3): qty 5

## 2023-02-28 MED ORDER — VITAL AF 1.2 CAL PO LIQD
1000.0000 mL | ORAL | Status: DC
  Administered 2023-02-28 – 2023-03-01 (×2): 1000 mL

## 2023-02-28 MED ORDER — GLYCOPYRROLATE 0.2 MG/ML IJ SOLN
0.1000 mg | Freq: Once | INTRAMUSCULAR | Status: AC
  Administered 2023-02-28: 0.1 mg via INTRAVENOUS
  Filled 2023-02-28: qty 1

## 2023-02-28 MED ORDER — SODIUM CHLORIDE 3 % IN NEBU
4.0000 mL | INHALATION_SOLUTION | RESPIRATORY_TRACT | Status: DC
  Administered 2023-02-28 – 2023-03-02 (×14): 4 mL via RESPIRATORY_TRACT
  Filled 2023-02-28 (×2): qty 4
  Filled 2023-02-28 (×2): qty 15
  Filled 2023-02-28: qty 4
  Filled 2023-02-28 (×2): qty 15
  Filled 2023-02-28 (×3): qty 4
  Filled 2023-02-28 (×2): qty 15
  Filled 2023-02-28: qty 4
  Filled 2023-02-28 (×2): qty 15

## 2023-02-28 MED ORDER — PROSOURCE TF20 ENFIT COMPATIBL EN LIQD
60.0000 mL | Freq: Every day | ENTERAL | Status: DC
  Administered 2023-02-28 – 2023-03-02 (×3): 60 mL
  Filled 2023-02-28 (×3): qty 60

## 2023-02-28 MED ORDER — SODIUM CHLORIDE 3 % IN NEBU
4.0000 mL | INHALATION_SOLUTION | RESPIRATORY_TRACT | Status: DC
  Filled 2023-02-28: qty 4

## 2023-02-28 MED ORDER — ASPIRIN 81 MG PO CHEW
81.0000 mg | CHEWABLE_TABLET | Freq: Every day | ORAL | Status: DC
  Administered 2023-02-28 – 2023-03-02 (×3): 81 mg
  Filled 2023-02-28 (×3): qty 1

## 2023-02-28 MED ORDER — VITAL AF 1.2 CAL PO LIQD
1000.0000 mL | ORAL | Status: DC
  Administered 2023-02-28: 1000 mL

## 2023-02-28 MED ORDER — INSULIN ASPART 100 UNIT/ML IJ SOLN
1.0000 [IU] | INTRAMUSCULAR | Status: DC
  Administered 2023-03-01 – 2023-03-02 (×5): 1 [IU] via SUBCUTANEOUS
  Administered 2023-03-02: 2 [IU] via SUBCUTANEOUS
  Administered 2023-03-02 – 2023-03-03 (×4): 1 [IU] via SUBCUTANEOUS

## 2023-02-28 MED ORDER — FREE WATER
200.0000 mL | Status: DC
  Administered 2023-02-28 – 2023-03-01 (×5): 200 mL

## 2023-02-28 MED ORDER — OXYCODONE HCL 5 MG PO TABS
5.0000 mg | ORAL_TABLET | Freq: Four times a day (QID) | ORAL | Status: DC | PRN
  Administered 2023-02-28 – 2023-03-02 (×6): 10 mg
  Filled 2023-02-28 (×7): qty 2

## 2023-02-28 MED ORDER — DEXTROSE 50 % IV SOLN
INTRAVENOUS | Status: AC
  Administered 2023-02-28: 12.5 g via INTRAVENOUS
  Filled 2023-02-28: qty 50

## 2023-02-28 MED ORDER — SODIUM CHLORIDE 0.9 % IV SOLN
1.0000 g | Freq: Two times a day (BID) | INTRAVENOUS | Status: DC
  Administered 2023-02-28 – 2023-03-01 (×3): 1 g via INTRAVENOUS
  Filled 2023-02-28 (×3): qty 20

## 2023-02-28 MED ORDER — DEXTROSE 50 % IV SOLN
12.5000 g | Freq: Once | INTRAVENOUS | Status: AC
Start: 2023-02-28 — End: 2023-02-28

## 2023-02-28 MED ORDER — SODIUM BICARBONATE 650 MG PO TABS
650.0000 mg | ORAL_TABLET | Freq: Three times a day (TID) | ORAL | Status: DC
  Administered 2023-02-28 – 2023-03-02 (×9): 650 mg
  Filled 2023-02-28 (×9): qty 1

## 2023-02-28 MED ORDER — ALBUTEROL SULFATE (2.5 MG/3ML) 0.083% IN NEBU
2.5000 mg | INHALATION_SOLUTION | RESPIRATORY_TRACT | Status: DC | PRN
  Administered 2023-02-28 – 2023-03-02 (×8): 2.5 mg via RESPIRATORY_TRACT
  Filled 2023-02-28 (×8): qty 3

## 2023-02-28 MED ORDER — LINEZOLID 600 MG PO TABS
600.0000 mg | ORAL_TABLET | Freq: Two times a day (BID) | ORAL | Status: DC
  Administered 2023-02-28 – 2023-03-01 (×2): 600 mg
  Filled 2023-02-28 (×3): qty 1

## 2023-02-28 MED ORDER — VITAL HIGH PROTEIN PO LIQD: 1000.0000 mL | ORAL | Status: DC

## 2023-02-28 NOTE — Progress Notes (Signed)
[facility] Progress Note  CC: GI bleed   Subjective: Patient remains unresponsive.  Moaning out loud.  He passed a small amount of bright red bloody stool overnight as reported by his RN.  There is a small amount of liquid brown stool mixed with red blood in the fecal bag at this time.   Objective:  Vital signs in last 24 hours: Temp:  [98 F (36.7 C)-99.3 F (37.4 C)] 98.4 F (36.9 C) (02/18 0400) Pulse Rate:  [88-121] 95 (02/18 1200) Resp:  [15-29] 18 (02/18 1200) BP: (73-128)/(39-99) 105/62 (02/18 1200) SpO2:  [88 %-100 %] 95 % (02/18 1200) Weight:  [93.8 kg] 93.8 kg (02/18 0510) Last BM Date : 02/28/23 General: Critically ill 68 year old male moaning out loud without audible words. Heart: Tachycardic, no murmur. Pulm: Congested coarse breath sounds throughout. Abdomen: Obese abdomen, mildly distended.  No obvious signs of tenderness, rebound or guarding.  Hypoactive bowel sounds to all 4 quadrants.  PEG tube, site intact. Extremities: Generalized anasarca. Neurologic: Patient is unresponsive.  Moaning out loud.  Does not follow commands.  Moves all extremities.  Intake/Output from previous day: 02/17 0701 - 02/18 0700 In: 2023.2 [I.V.:1172.7; Blood:170; IV Piggyback:480.6] Out: 2380 [Urine:2380] Intake/Output this shift: Total I/O In: 717.1 [I.V.:287.1; IV Piggyback:430] Out: 300 [Urine:300]  Lab Results: Recent Labs    02/27/23 2058 02/28/23 0449 02/28/23 0952  WBC 23.0* 23.5* 24.3*  HGB 8.5* 8.3* 7.9*  HCT 25.6* 26.0* 24.8*  PLT 111* 101* 99*   BMET Recent Labs    02/27/23 0536 02/27/23 2058 02/28/23 0449  NA 137 138 141  K 5.1 4.5 4.5  CL 108 110 111  CO2 15* 17* 16*  GLUCOSE 123* 93 82  BUN 102* 90* 83*  CREATININE 2.53* 2.27* 2.14*  CALCIUM 8.4* 8.1* 8.2*   LFT Recent Labs    02/28/23 0449  PROT 5.0*  ALBUMIN <1.5*  AST 62*  ALT 68*  ALKPHOS 245*  BILITOT 1.4*  BILIDIR 0.7*  IBILI 0.7   PT/INR Recent Labs     02/26/23 2205  LABPROT 16.7*  INR 1.3*   Hepatitis Panel No results for input(s): "HEPBSAG", "HCVAB", "HEPAIGM", "HEPBIGM" in the last 72 hours.  US Abdomen Limited RUQ (LIVER/GB) Result Date: 02/27/2023 CLINICAL DATA:  Elevated LFTs EXAM: ULTRASOUND ABDOMEN LIMITED RIGHT UPPER QUADRANT COMPARISON:  None Available. FINDINGS: Gallbladder: Gallbladder is contracted. Scattered small stones are identified. No other focal abnormality is seen. Common bile duct: Diameter: 6.6 mm. Liver: Liver is somewhat mildly nodular. No focal mass is seen. Portal vein is patent on color Doppler imaging with normal direction of blood flow towards the liver. Other: None. IMPRESSION: Chronic cholelithiasis without complicating factors. Mild nodularity of the liver. Electronically Signed   By: Alcide Clever M.D.   On: 02/27/2023 21:56   DG CHEST PORT 1 VIEW Result Date: 02/27/2023 CLINICAL DATA:  Central line placement. EXAM: PORTABLE CHEST 1 VIEW COMPARISON:  Chest radiograph dated 02/26/2023. FINDINGS: Right IJ central venous line with tip in the region of the cavoatrial junction. Bilateral confluent pulmonary opacities, right greater than left, new or progressed since the prior radiograph. No pleural effusion pneumothorax. Stable cardiac silhouette. Atherosclerotic calcification of the aorta. No acute osseous pathology. IMPRESSION: 1. Right IJ central venous line with tip in the region of the cavoatrial junction. No pneumothorax. 2. Pneumonia. Electronically Signed   By: Elgie Collard M.D.   On: 02/27/2023 18:08   ECHOCARDIOGRAM COMPLETE Result Date: 02/27/2023    ECHOCARDIOGRAM  REPORT   Patient Name:   JAIS DEMIR Date of Exam: 02/27/2023 Medical Rec #:  [id number]        Height:       69.0 in Accession #:    [id number]       Weight:       197.3 lb Date of Birth:  1955-05-09        BSA:          2.054 m Patient Age:    67 years         BP:           100/66 mmHg Patient Gender: M                HR:           102 bpm.  Exam Location:  Inpatient Procedure: 2D Echo, Cardiac Doppler, Color Doppler and Intracardiac            Opacification Agent (Both Spectral and Color Flow Doppler were            utilized during procedure). Indications:    I50.40* Unspecified combined systolic (congestive) and diastolic                 (congestive) heart failure  History:        Patient has prior history of Echocardiogram examinations, most                 recent 12/22/2022. Cardiomyopathy and CHF, Abnormal ECG, Stroke;                 Risk Factors:Current Smoker, Diabetes, Hypertension and                 Dyslipidemia. Shock. Cocaine use. Apical thrombus.  Sonographer:    Sheralyn Boatman RDCS Referring Phys: [id number] JESSICA MARSHALL  Sonographer Comments: Supine. IMPRESSIONS  1. No evidence of LV apical thrombus with definity contrast.. Left ventricular ejection fraction, by estimation, is 35 to 40%. The left ventricle has moderately decreased function. The left ventricle demonstrates regional wall motion abnormalities (see scoring diagram/findings for description). The left ventricular internal cavity size was mildly dilated. Indeterminate diastolic filling due to E-A fusion. There is akinesis of the left ventricular, entire apical segment. There is akinesis of the left ventricular, apical septal wall, inferior wall, anterior wall, inferolateral wall, anterolateral wall and lateral wall. There is hypokinesis of the left ventricular, basal-mid inferoseptal wall.  2. Right ventricular systolic function is normal. The right ventricular size is normal. Tricuspid regurgitation signal is inadequate for assessing PA pressure.  3. The mitral valve is normal in structure. Trivial mitral valve regurgitation. No evidence of mitral stenosis.  4. The aortic valve was not well visualized. Aortic valve regurgitation is not visualized. Aortic valve sclerosis/calcification is present, without any evidence of aortic stenosis.  5. The inferior vena cava is normal in size  with greater than 50% respiratory variability, suggesting right atrial pressure of 3 mmHg. FINDINGS  Left Ventricle: No evidence of LV apical thrombus with definity contrast. Left ventricular ejection fraction, by estimation, is 35 to 40%. The left ventricle has moderately decreased function. The left ventricle demonstrates regional wall motion abnormalities. Definity contrast agent was given IV to delineate the left ventricular endocardial borders. Strain imaging was not performed. The left ventricular internal cavity size was mildly dilated. There is no left ventricular hypertrophy. Indeterminate diastolic filling due to E-A fusion. Normal left ventricular filling pressure. Right Ventricle: The right ventricular size is normal.  No increase in right ventricular wall thickness. Right ventricular systolic function is normal. Tricuspid regurgitation signal is inadequate for assessing PA pressure. Left Atrium: Left atrial size was normal in size. Right Atrium: Right atrial size was normal in size. Pericardium: There is no evidence of pericardial effusion. Mitral Valve: The mitral valve is normal in structure. Trivial mitral valve regurgitation. No evidence of mitral valve stenosis. Tricuspid Valve: The tricuspid valve is normal in structure. Tricuspid valve regurgitation is not demonstrated. No evidence of tricuspid stenosis. Aortic Valve: The aortic valve was not well visualized. Aortic valve regurgitation is not visualized. Aortic valve sclerosis/calcification is present, without any evidence of aortic stenosis. Pulmonic Valve: The pulmonic valve was normal in structure. Pulmonic valve regurgitation is not visualized. No evidence of pulmonic stenosis. Aorta: The aortic root is normal in size and structure. Venous: The inferior vena cava is normal in size with greater than 50% respiratory variability, suggesting right atrial pressure of 3 mmHg. IAS/Shunts: No atrial level shunt detected by color flow Doppler.  Additional Comments: 3D imaging was not performed.  LEFT VENTRICLE PLAX 2D LVIDd:         5.80 cm      Diastology LVIDs:         4.60 cm      LV e' medial:    10.90 cm/s LV PW:         1.00 cm      LV E/e' medial:  7.2 LV IVS:        1.00 cm      LV e' lateral:   15.60 cm/s LVOT diam:     2.20 cm      LV E/e' lateral: 5.1 LV SV:         70 LV SV Index:   34 LVOT Area:     3.80 cm  LV Volumes (MOD) LV vol d, MOD A2C: 112.0 ml LV vol d, MOD A4C: 91.2 ml LV vol s, MOD A2C: 87.9 ml LV vol s, MOD A4C: 79.6 ml LV SV MOD A2C:     24.1 ml LV SV MOD A4C:     91.2 ml LV SV MOD BP:      19.1 ml RIGHT VENTRICLE             IVC RV S prime:     12.10 cm/s  IVC diam: 1.80 cm TAPSE (M-mode): 2.2 cm LEFT ATRIUM             Index        RIGHT ATRIUM           Index LA diam:        3.40 cm 1.66 cm/m   RA Area:     15.20 cm LA Vol (A2C):   78.0 ml 37.97 ml/m  RA Volume:   41.10 ml  20.01 ml/m LA Vol (A4C):   15.8 ml 7.69 ml/m LA Biplane Vol: 36.7 ml 17.87 ml/m  AORTIC VALVE LVOT Vmax:   127.00 cm/s LVOT Vmean:  87.800 cm/s LVOT VTI:    0.184 m  AORTA Ao Root diam: 3.70 cm Ao Asc diam:  3.10 cm MITRAL VALVE MV Area (PHT): 6.71 cm     SHUNTS MV Decel Time: 113 msec     Systemic VTI:  0.18 m MV E velocity: 78.80 cm/s   Systemic Diam: 2.20 cm MV A velocity: 106.00 cm/s MV E/A ratio:  0.74 Armanda Magic MD Electronically signed by Armanda Magic MD Signature Date/Time: 02/27/2023/1:57:29 PM  Final    [place] EKG SITE RITE Result Date: 02/27/2023 If [facility] image not attached, placement could not be confirmed due to current cardiac rhythm.  DG Chest Port 1 View Result Date: 02/26/2023 CLINICAL DATA:  Sepsis EXAM: PORTABLE CHEST 1 VIEW COMPARISON:  01/30/2023 FINDINGS: Stable pulmonary insufflation. Small right pleural effusion. Progressive perihilar pulmonary infiltrate most in keeping with perihilar pulmonary edema. Stable cardiomegaly. No pneumothorax. No acute bone abnormality. IMPRESSION: 1. Cardiomegaly with progressive  perihilar pulmonary edema and small right pleural effusion. Electronically Signed   By: Helyn Numbers M.D.   On: 02/26/2023 22:25   Patient Profile: Patient is a 68 y.o. male with history of hypertension, hyperlipidemia coronary artery disease s/p multiple PCIs, ischemic cardiomyopathy, chronic combined systolic and diastolic CHF, LV mural thrombus, diabetes mellitus type 2, sleep apnea, cocaine abuse, CKD stage III, peripheral vascular disease, Fournie'sr gangrene, meningioma, frequent falls, IDA, GERD, colon polyps and hospitalized d/t a perianal abscess with sepsis 12/2022. He was previously admitted to the hospital for acute CHF and was subsequently discharged to [facility]. He as readmitted to the hospital 1/18 - 02/14/2023 with an acute ischemic CVA with dysphagia. Brain MRI positive for small acute infarct in the left insular white matter.  PEG tube was placed 02/02/2023 and he was subsequently discharged to [facility].  He presented to [facility] ED 02/26/2023 via EMS secondary to passing 6 large maroon bloody stools with associated hypotension and being treated for sepsis secondary to  MSSA/E. Coli bacteremia.    Assessment / Plan:  68 year old male with hematochezia with acute on chronic anemia admitted with hemorrhagic +/- septic shock. Admission hemoglobin 5.4, transfused 2 units of PRBC -> Hg 7.7 -> Hg 7.6 -> 8.5 -> today Hg 8.3.  Today, fecal bag contains a small amount of liquid brown stool mixed with red blood. Remains on Levophed. Prior EGD 11/2022 showed mild gastritis and duodenitis without evidence of H. pylori or malignancy. Colonoscopy resulted in a poor prep and 4 large tubular adenomatous polyps were removed from the colon, one polyp with visible oozing.   - ? Resume trickle tube feeding  -IV fluids and pain management per the hospitalist -Transfuse for hemoglobin less than 8 and as needed if symptomatic -Continue to monitor patient closely for active GI   bleeding -Endoscopic evaluation deferred for now, to reconsider if patient's hemoglobin significantly drops or if he demonstrates significant active GI bleeding.  -Continue Pantoprazole 1 mg IV every 8 hours -Await further recommendations by Dr. Leonides Schanz  Diarrhea, C. difficile antigen and toxin negative.  GI pathogen panel positive for Norovirus. -Enteric precautions   MSSA/Ecoli bacteremia/sepsis on Meropenem and Linezolid IV. WBC 23 -> 23.5.   Elevated LFTs, possible shock liver.  CTAP without contrast 01/21/2023 showed a somewhat nodular liver, suggestive of cirrhosis.  No evidence of esophageal varices per EGD 11/2022. T. Bili 2.0 -> 1.4. Alk phos 305 -> 245. AST 80 -> 62. ALT 89 -> 68.  -Hepatic panel in a.m. -RUQ sonogram when patient hemodynamically stable  Thrombocytopenia.  Platelet count 101.  Hypoalbuminemia. Albumin < 1.5.   Acute CVA, left hemiparesis, dysphagia s/p PEG. On ASA 81mg  every day.   CAD s/p multiple PCIs   Chronic combined systolic and diastolic CHF   Colon polyps. Colonoscopy 11/2022 identified 4 large tubular adenomatous polyps were removed from the colon, one polyp with visible oozing.  A repeat colonoscopy in 1 to 3 months was recommended as an outpatient.  AKI on CKD stage III. Cr 2.27 -> 2.14.    DM type II      Principal Problem:   Hemorrhagic shock (HCC) Active Problems:   Gastrointestinal hemorrhage with melena     LOS: 1 day   Arnaldo Natal  02/28/2023, 2:18PM

## 2023-02-28 NOTE — Progress Notes (Signed)
PHARMACY - PHYSICIAN COMMUNICATION CRITICAL VALUE ALERT - BLOOD CULTURE IDENTIFICATION (BCID)  [name] is an 68 y.o. male who presented to [facility] on 02/26/2023 with a chief complaint of MRSA bacteremia, PNA, UTI.   Assessment:  Pt now growing Candida glabrata in blood  Name of physician (or Provider) Contacted: Dr. Chestine Spore (about yeast in blood), Dr. Warrick Parisian (about Candida glabrata  Current antibiotics: Linezolid, meropenem  Changes to prescribed antibiotics recommended:  Add micafungin 100mg  IV q24h. ID already following pt.  Results for orders placed or performed during the hospital encounter of 02/26/23  Blood Culture ID Panel (Reflexed) (Collected: 02/26/2023 10:10 PM)  Result Value Ref Range   Enterococcus faecalis NOT DETECTED NOT DETECTED   Enterococcus Faecium NOT DETECTED NOT DETECTED   Listeria monocytogenes NOT DETECTED NOT DETECTED   Staphylococcus species NOT DETECTED NOT DETECTED   Staphylococcus aureus (BCID) NOT DETECTED NOT DETECTED   Staphylococcus epidermidis NOT DETECTED NOT DETECTED   Staphylococcus lugdunensis NOT DETECTED NOT DETECTED   Streptococcus species NOT DETECTED NOT DETECTED   Streptococcus agalactiae NOT DETECTED NOT DETECTED   Streptococcus pneumoniae NOT DETECTED NOT DETECTED   Streptococcus pyogenes NOT DETECTED NOT DETECTED   A.calcoaceticus-baumannii NOT DETECTED NOT DETECTED   Bacteroides fragilis NOT DETECTED NOT DETECTED   Enterobacterales NOT DETECTED NOT DETECTED   Enterobacter cloacae complex NOT DETECTED NOT DETECTED   Escherichia coli NOT DETECTED NOT DETECTED   Klebsiella aerogenes NOT DETECTED NOT DETECTED   Klebsiella oxytoca NOT DETECTED NOT DETECTED   Klebsiella pneumoniae NOT DETECTED NOT DETECTED   Proteus species NOT DETECTED NOT DETECTED   Salmonella species NOT DETECTED NOT DETECTED   Serratia marcescens NOT DETECTED NOT DETECTED   Haemophilus influenzae NOT DETECTED NOT DETECTED   Neisseria meningitidis NOT  DETECTED NOT DETECTED   Pseudomonas aeruginosa NOT DETECTED NOT DETECTED   Stenotrophomonas maltophilia NOT DETECTED NOT DETECTED   Candida albicans NOT DETECTED NOT DETECTED   Candida auris NOT DETECTED NOT DETECTED   Candida glabrata DETECTED (A) NOT DETECTED   Candida krusei NOT DETECTED NOT DETECTED   Candida parapsilosis NOT DETECTED NOT DETECTED   Candida tropicalis NOT DETECTED NOT DETECTED   Cryptococcus neoformans/gattii NOT DETECTED NOT DETECTED    Christoper Fabian, PharmD, BCPS Please see amion for complete clinical pharmacist phone list 02/28/2023  8:01 PM

## 2023-02-28 NOTE — Progress Notes (Signed)
NAME:  [name], MRN:  [id number], DOB:  Feb 07, 1955, LOS: 1 ADMISSION DATE:  02/26/2023, CONSULTATION DATE:  2331 REFERRING MD:  EDP, CHIEF COMPLAINT:  acute GIB   History of Present Illness:  [name] presented after being actively treated for sepsis (MSSA/Ecoli and bacteremia, diagnosed and treated at [facility]) , recent discharged from [facility] on 02/14/23. He had an episode of hypotension after a large melanotic stool earlier today. Per report from EDP, as pt is aphasic 2/2 CVA, [facility] (his LT[facility]) reported that pt was improving from a septic standpoint until today. Pt is on no chronic a/c but is on ASA per his peg tube. His facility reportedly started volume resuscitation while awaiting transport to ED. Pt had mild improvement in BP but has ultimately required initiation of vasopressors via piv. His hgb was found to be 6.1. EDP ordered 2 U PRBC at this time and is contacting GI for consultation.   Recent PEG placement 02/02/23. Recent c-scope (inadequate prep) in 11/2022. Had multiple poolyps removed, as large as 25mm.   All history is obtained from chart review as pt is unable to provide any 2/2 baseline status.   Ccm was asked to admit 2/2 need for vasopressors at this time.   Pertinent  Medical History  H/o cva with resultant aphasia, dysphagia and bedbound status Advanced dementia Htn Hyperlipidemia T2dm with hyperglycemia Ckd2 gerd   Significant Hospital Events: Including procedures, antibiotic start and stop dates in addition to other pertinent events   Admitted to ICU 2/16. 2 units pRBC given at admission.  2/17 1 unit pRBC, CVC placed  Interim History / Subjective:  More lethargic this morning. Less rectal output, still bloody.   Objective   Blood pressure 98/63, pulse 91, temperature 98.4 F (36.9 C), temperature source Bladder, resp. rate 18, weight 93.8 kg, SpO2 97%. CVP:  [0 mmHg-29 mmHg] 29 mmHg      Intake/Output Summary (Last 24 hours) at 02/28/2023  0853 Last data filed at 02/28/2023 0800 Gross per 24 hour  Intake 2043.57 ml  Output 2380 ml  Net -336.43 ml   Filed Weights   02/28/23 0510  Weight: 93.8 kg    Examination: General: chronically ill appearing man lying in bed in NAD HENT: [name]/AT, eyes anicteric Lungs: Mild tachypnea, no accessory muscles.  Rhonchi cleared with NTS-- purulent secretions. Cardiovascular: S1S2, RRR Abdomen: obese, soft, mildly TTP, abdominal binder remains in place. Some drainage around PEG site, able to express some thick mucus vs pus. No induration or erythema in the overlying skin at the site.  Extremities: mild edema, no cyanosis Neuro: lethargic, groans and winces with painful stimulation, but not following commands or trying to talk today. Strong cough reflex.  When he wakes he starts yelling quickly then falls back asleep. GU: foley with amber urine  Bicarb 16 BUN 83 Cr 2.14 AST 62 ALT 68 T bili 1.4 WBC 23.5 H/H 8.3/26 Platelets 101  Stool studies: No c diff. GI panel pending RUQ [place]: chronic cholelithiasis, mild liver nodularity. No GB wall thickening or ductal dilation.   Blood culture 2/16: NG x 2 days  Echocardiogram-LVEF 35 to 40%, moderately decreased function.  Wall motion abnormalities present-akinesis of the entire apical segment of the LV, apical septal wall, inferior wall, anterior wall, inferolateral wall, anterolateral and lateral walls.  Hypokinesis of the basal and mid inferoseptal wall.    Resolved Hospital Problem list   Hyperkalemia  Assessment & Plan:  Shock, unclear if from GIB or  sepsis. At this point with stable H/H and minimal stool output, seems more likely septic.  -CBC q6h today, can deescalate if it remains stable -stool studies pending -enteric precautions -escalate to linezolid and meropenem with escalating WBC -con't NE to maintain MAP >65 -con't to follow cultures -culture sputum  Acute GIB- stable Acute blood loss anemia on chronic anemia, s/p 3  units pRBC Hemorrhagic shock with lactic acidosis -serial CBCs -transfuse for Hb <7 or hemodynamically significant bleeding -still needs CVC for difficult labs and access -PPI BID -appreciate GI's management; no plans currently to scope. -con't holding chemical DVT prophylaxis today, can restart aspirin  H/o CVA with chronic deficits, PEG dependent -can resume trickle TF while monitoring stool output, Hb -statin, restarting aspirin today  MSSA pneumonia, E coli UTI, subacute- present on admission. No bacteremia present based on cultures sent from [facility].  -reculture sputum -con't antibiotics- linezolid, escalating to meropenem -blood cultures here pending -CT to evaluate area around PEG tube  Metabolic acidosis with lactic acidosis; slowly improving AKI on CKD2; mildly improved.  -strict I/O -renally dose meds, avoid nephrotoxic meds -maintain adequate renal perfusion -volume resuscitate enterally as able -start enteral bicarb  DM2 with Hyperglycemia; A1c 7.2 -SSI PRN Q4h> has not been needing -goal BG 140-180 if needing insulin  Chronic HFrEF; EF 35-40%> improved from last month 25-30%.  -Con't holding GDMT while hypotensive. Coreg stopped during last admission. Hold isordil, hydralazine, metoprolol torsemide. Will have to reinitiate slowly once off vasopressors.  -monitor on tele  Elevated transaminases Elevated alk phos Hyperbilirubinemia due to acute GIB vs sepsis; RUQ reassuring for no acute biliary disease -supportive care   Thrombocytopenia due to sepsis -monitor - no current need for transfusion  Moderate protein energy malnutrition History of TF intolerance requiring reglan -start TF today if no plans for intervention or visible bleeding -start TF slowly; monitor for intolerance  Intractable pain; still screams when he wakes up -oxycodone & dilaudid PRN  Dysphagia -last seen by SLP 02/10/23, recommended NPO -can re-consult when more stable.    Decubitus ulcer sacrum; stage 2, POA Skin tear mid-penis -wound care frequent turns -focus on nutrition - restart TF today   Daughter [name] updated over the phone.   Best Practice (right click and "Reselect all SmartList Selections" daily)   Diet/type: tubefeeds DVT prophylaxis SCD Pressure ulcer(s): present on admission  see RN assessment GI prophylaxis: PPI Lines: Central line Foley:  Yes, and it is still needed Code Status:  DNR but ok with intubation, pressors etc Last date of multidisciplinary goals of care discussion [2/18 with daughter [name]]  Labs   CBC: Recent Labs  Lab 02/26/23 2205 02/26/23 2219 02/27/23 0320 02/27/23 0536 02/27/23 1812 02/27/23 2058 02/28/23 0449  WBC 18.5*  --  17.5* 18.8* 24.5* 23.0* 23.5*  NEUTROABS 17.8*  --  16.5* 17.8*  --  22.1*  --   HGB 5.4*   < > 7.7* 7.6* 8.5* 8.5* 8.3*  HCT 18.4*   < > 26.0* 24.1* 26.0* 25.6* 26.0*  MCV 81.1  --  88.7 81.1 80.2 78.8* 80.5  PLT 136*  --  107* 125* 125* 111* 101*   < > = values in this interval not displayed.    Basic Metabolic Panel: Recent Labs  Lab 02/26/23 2205 02/26/23 2219 02/27/23 0536 02/27/23 2058 02/28/23 0449  NA 136 135 137 138 141  K 5.3* 5.3* 5.1 4.5 4.5  CL 108 108 108 110 111  CO2 16*  --  15* 17* 16*  GLUCOSE 185* 177* 123* 93 82  BUN 104* 104* 102* 90* 83*  CREATININE 2.74* 3.20* 2.53* 2.27* 2.14*  CALCIUM 8.1*  --  8.4* 8.1* 8.2*  MG  --   --  1.8 2.2 2.2  PHOS  --   --  5.1*  --   --    GFR: Estimated Creatinine Clearance: 37.3 mL/min (A) (by C-G formula based on SCr of 2.14 mg/dL (H)). Recent Labs  Lab 02/26/23 2220 02/27/23 0320 02/27/23 0536 02/27/23 1812 02/27/23 2058 02/28/23 0449  WBC  --    < > 18.8* 24.5* 23.0* 23.5*  LATICACIDVEN 3.8*  --  2.3*  --   --   --    < > = values in this interval not displayed.    Liver Function Tests: Recent Labs  Lab 02/26/23 2205 02/27/23 0536 02/28/23 0449  AST 82* 80* 62*  ALT 96* 89* 68*  ALKPHOS 325*  305* 245*  BILITOT 1.0 2.0* 1.4*  PROT 5.2* 3.8* 5.0*  ALBUMIN <1.5* <1.5* <1.5*   No results for input(s): "LIPASE", "AMYLASE" in the last 168 hours. No results for input(s): "AMMONIA" in the last 168 hours.  ABG    Component Value Date/Time   PHART 7.45 02/04/2023 1329   PCO2ART 48 02/04/2023 1329   PO2ART 70 (L) 02/04/2023 1329   HCO3 33.4 (H) 02/04/2023 1329   TCO2 16 (L) 02/26/2023 2219   ACIDBASEDEF 3.6 (H) 01/28/2023 1022   O2SAT 95.7 02/04/2023 1329     Critical care time:        This patient is critically ill with multiple organ system failure which requires frequent high complexity decision making, assessment, support, evaluation, and titration of therapies. This was completed through the application of advanced monitoring technologies and extensive interpretation of multiple databases. During this encounter critical care time was devoted to patient care services described in this note for 50 minutes.  Steffanie Dunn, DO 02/28/23 9:34 AM [facility]  For contact information, see Amion. If no response to pager, please call PCCM consult pager. After hours, 7PM- 7AM, please call Elink.

## 2023-02-28 NOTE — Consult Note (Signed)
WOC Nurse Consult Note: Reason for Consult: Requested to assess a stage 2 pressure injury. Wound type: MASD on coccyx, close to the anus. Old scar tissue on sacrum. Pressure Injury POA: NA Measurement: multiple skin breakdowns close to anus, less than 0.3 cm, three as total. Wound bed: 100% red. Drainage (amount, consistency, odor)  Periwound: intact, old scar on sacrum. Pt has Flexi seal. Dressing procedure/placement/frequency: Keep dry and clean. Cover the area with foam dressing, change every 3 days or PRN soiling.   WOC team will not plan to follow further.  Please reconsult if further assistance is needed. Thank-you,  Denyse Amass BSN, RN, [employer], WOC  (Pager: [phone])

## 2023-02-28 NOTE — Progress Notes (Signed)
eLink Physician-Brief Progress Note Patient Name: [name] DOB: 11-07-1955 MRN: [id number]   Date of Service  02/28/2023  HPI/Events of Note  Most recent Hemoglobin 7.9  gm / dl.  eICU Interventions          Migdalia Dk 02/28/2023, 7:46 PM

## 2023-02-28 NOTE — Progress Notes (Signed)
Patient not available at time of scheduled CPT.

## 2023-02-28 NOTE — Consult Note (Signed)
[facility]    Date of Admission:  02/26/2023     Total days of antibiotics 1   Meropenem   Linezolid               Reason for Consult: Staph Aureus Bacteremia at OSH, ESBL E coli     Referring Provider: Auto-consult with SAB Primary Care Provider: Benetta Spar, MD   Assessment: [name] is a 67 y.o. male admitted from [facility] with -    Staph Aureus Bacteremia (OSH) -  E Coli UTI, ESBL (OSH) -  Pneumonia with Dense B/L Consolidation -  Central line placed 2/16, arrival blood cultures are no growth to date. Alerted that one blood culture was positive for staph aureus with sensitivities still pending Started on vancomycin + piperacillin-tazobactam -- broadened to linezoild + meropenem with leukocytosis.  Suspect his UTI is adequately treated given cultures were from 2/11 and was receiving antibiotics at [facility]. Would consider stopping Meropenem after today.  2/12 MRSA in Sputum  2/11 Urine with E Coli (S-cipro, impenem, nitrofurantoin, R-TMP/S) 2/11 blood cultures 1/2 staph aureus TTE calcified aortic valve (not well visualized), MV normal, TV normal, PV normal.  -Agree to continue the linezolid for safer option given his refusal for labs on Vancomycin and what seems to be cleared bacteremia from PTA. Need to watch platelets though... -Presume staph aureus 2/2 pneumonia.  -consider stopping meropenem after today.   Hypotension with Acute GI Hemorrhage -  Norovirus ( + )   Being transfused - CBC q6h per PCCM / GI team. Now S/P 3 units pRBC. Suspect that this was triggered by infectious enteritis. Stable H/H - treating medically for now w/o scope. GI signed off.  -Enteric precautions  Norovirus ( + )   Enteric precautions and supportive care to avoid dehydration   Meningioma on Steroids -  Ongoing decadron noted 1mg  BID - should have been tapering off per NSGY recommendations.  Suspect this may be contributing  to leukocytosis  -is there a reason to continue this?   Plan: Continue linezolid  Follow platelets (with CKD higher risk for myelosuppression/toxicity)   Consider stopping meropenem after today (will be 7d course treatment for 2/11 urine culture).  Would consider tapering off the steroids    Principal Problem:   Hemorrhagic shock (HCC) Active Problems:   Gastrointestinal hemorrhage with melena    aspirin  81 mg Per Tube Daily   atorvastatin  40 mg Per Tube Daily   Chlorhexidine Gluconate Cloth  6 each Topical Daily   dexamethasone (DECADRON) injection  1 mg Intravenous Q12H   feeding supplement (PROSource TF20)  60 mL Per Tube Daily   folic acid  1 mg Per Tube Daily   free water  200 mL Per Tube Q4H   gabapentin  100 mg Per Tube QHS   insulin aspart  1-3 Units Subcutaneous Q4H   linezolid  600 mg Per Tube Q12H   mupirocin ointment  1 Application Nasal BID   pantoprazole (PROTONIX) IV  40 mg Intravenous Q8H   Followed by   Melene Muller ON 03/02/2023] pantoprazole (PROTONIX) IV  40 mg Intravenous Q12H   sodium bicarbonate  650 mg Per Tube TID   sodium chloride HYPERTONIC  4 mL Nebulization Q4H    HPI: [name] is a 68 y.o. male admitted from [facility] for hypotension and bloody stools requiring IVF resuscitation and levophed infusion.   PMHx:  CVA  Jan 2025, Dementia, HTN, Remote cocaine use, HLD, T2DM, CKD, CAD.   Recent Hx:  January 28, 2023 presented with left sided hemiparesis and dysphagia - acute stroke noted. Hospitalization complicated by persistent encephalopathy. Meningioma on brain MRI noted - Dr. Danielle Dess recommended trial of dexamethasone 4 mg BID which did improve his condition. Discharged on taper that was due to end today. Required feeding tube placement for assistance with enteral feedings. There were considerations given his poor prognosis and lack of improvement for Hospice consultation. Daughter requested to transfer to [facility] in [place] for second  opinion --> not accepted for transfer. Was discharged to [facility] on 2/4.   He is not currently able to participate in care / interview and non-verbal at baseline due to dementia.   Per ER notes - "patient was being treated for septic shock with IV abx noted for MSSA bacteremia and E coli infection."  On arrival HgB was only 5.4 - plan for aggressive transfusion and resuscitation + Protonix for now until more stable. On chronic ASA via tube. NO other AC.  Creatinine 2.74 on arrival.   He is a DNR but ongoing aggressive care to treat the treatable.   He has a stage 2 pressure sore on sacrum that does not appear infected.    Review of Systems: ROS  Past Medical History:  Diagnosis Date   Bulging lumbar disc    CAD (coronary artery disease) [id number]   a. prior LAD stenting. b. s/p DES to [facility] 08/2015. c. 04/2016 Cardiac cath at [facility]. Patent stent in the PLAD and RCA. Diffuse dLAD, OM2, and  RPDA disease. d. DES to PDA and distal RCA 03/2019    Chronic lower back pain    CKD (chronic kidney disease), stage II    Cocaine abuse (HCC)    Cyst of epididymis    DM2 (diabetes mellitus, type 2) (HCC)    Essential hypertension    Fournier gangrene    GERD (gastroesophageal reflux disease)    Headache    History of pneumonia    Hyperlipidemia    Ischemic cardiomyopathy    LV (left ventricular) mural thrombus    Sleep apnea     Social History   Tobacco Use   Smoking status: Every Day    Current packs/day: 0.50    Average packs/day: 0.5 packs/day for 48.0 years (24.0 ttl pk-yrs)    Types: Cigarettes    Passive exposure: Current   Smokeless tobacco: Never   Tobacco comments:    Smoking Cessation Classes, Agencies, Services & Resources Offered.  Vaping Use   Vaping status: Never Used  Substance Use Topics   Alcohol use: Not Currently    Comment: 11/30/2017 "might drink a beer q 6 months"   Drug use: Not Currently    Types: Cocaine    Family History  Problem  Relation Age of Onset   Hypertension Mother    Diabetes Mother    Allergies  Allergen Reactions   Plavix [Clopidogrel] Other (See Comments)    Drowsiness Skin irritation    OBJECTIVE: Blood pressure (!) 106/58, pulse 97, temperature 98.4 F (36.9 C), temperature source Bladder, resp. rate 18, weight 93.8 kg, SpO2 97%.  Physical Exam  Lab Results Lab Results  Component Value Date   WBC 24.3 (H) 02/28/2023   HGB 7.9 (L) 02/28/2023   HCT 24.8 (L) 02/28/2023   MCV 80.8 02/28/2023   PLT 99 (L) 02/28/2023    Lab Results  Component Value  Date   CREATININE 2.14 (H) 02/28/2023   BUN 83 (H) 02/28/2023   NA 141 02/28/2023   K 4.5 02/28/2023   CL 111 02/28/2023   CO2 16 (L) 02/28/2023    Lab Results  Component Value Date   ALT 68 (H) 02/28/2023   AST 62 (H) 02/28/2023   GGT 275 (H) 02/26/2023   ALKPHOS 245 (H) 02/28/2023   BILITOT 1.4 (H) 02/28/2023     Microbiology: Recent Results (from the past 240 hours)  Blood Culture (routine x 2)     Status: None (Preliminary result)   Collection Time: 02/26/23 10:05 PM   Specimen: BLOOD  Result Value Ref Range Status   Specimen Description BLOOD RIGHT ANTECUBITAL  Final   Special Requests   Final    BOTTLES DRAWN AEROBIC AND ANAEROBIC Blood Culture adequate volume   Culture   Final    NO GROWTH 2 DAYS Performed at [facility], [address] [place], [place] [address]    Report Status PENDING  Incomplete  Resp panel by RT-PCR (RSV, Flu A&B, Covid)     Status: None   Collection Time: 02/26/23 10:07 PM   Specimen: Nasal Swab  Result Value Ref Range Status   SARS Coronavirus 2 by RT PCR NEGATIVE NEGATIVE Final   Influenza A by PCR NEGATIVE NEGATIVE Final   Influenza B by PCR NEGATIVE NEGATIVE Final    Comment: (NOTE) The Xpert Xpress SARS-CoV-2/FLU/RSV plus assay is intended as an aid in the diagnosis of influenza from Nasopharyngeal swab specimens and should not be used as a sole basis for treatment. Nasal washings  and aspirates are unacceptable for Xpert Xpress SARS-CoV-2/FLU/RSV testing.  Fact Sheet for Patients: BloggerCourse.com  Fact Sheet for Healthcare Providers: SeriousBroker.it  This test is not yet approved or cleared by the [place] FDA and has been authorized for detection and/or diagnosis of SARS-CoV-2 by FDA under an Emergency Use Authorization (EUA). This EUA will remain in effect (meaning this test can be used) for the duration of the COVID-19 declaration under Section 564(b)(1) of the Act, 21 U.S.C. section 360bbb-3(b)(1), unless the authorization is terminated or revoked.     Resp Syncytial Virus by PCR NEGATIVE NEGATIVE Final    Comment: (NOTE) Fact Sheet for Patients: BloggerCourse.com  Fact Sheet for Healthcare Providers: SeriousBroker.it  This test is not yet approved or cleared by the [place] FDA and has been authorized for detection and/or diagnosis of SARS-CoV-2 by FDA under an Emergency Use Authorization (EUA). This EUA will remain in effect (meaning this test can be used) for the duration of the COVID-19 declaration under Section 564(b)(1) of the Act, 21 U.S.C. section 360bbb-3(b)(1), unless the authorization is terminated or revoked.  Performed at [facility], [address], [place], [place] [address]   Blood Culture (routine x 2)     Status: None (Preliminary result)   Collection Time: 02/26/23 10:10 PM   Specimen: BLOOD  Result Value Ref Range Status   Specimen Description BLOOD LEFT ANTECUBITAL  Final   Special Requests   Final    BOTTLES DRAWN AEROBIC AND ANAEROBIC Blood Culture results may not be optimal due to an inadequate volume of blood received in culture bottles   Culture   Final    NO GROWTH 2 DAYS Performed at [facility], [address], [place], [place] [address]    Report Status PENDING  Incomplete  MRSA Next Gen  by PCR, Nasal  Status: Abnormal   Collection Time: 02/27/23  2:47 AM   Specimen: Nasal Mucosa; Nasal Swab  Result Value Ref Range Status   MRSA by PCR Next Gen DETECTED (A) NOT DETECTED Final    Comment: RESULT CALLED TO, READ BACK BY AND VERIFIED WITH: R SARANE,RN@0424  02/27/23 MK (NOTE) The GeneXpert MRSA Assay (FDA approved for NASAL specimens only), is one component of a comprehensive MRSA colonization surveillance program. It is not intended to diagnose MRSA infection nor to guide or monitor treatment for MRSA infections. Test performance is not FDA approved in patients less than 34 years old. Performed at [facility], [address], [place], [place] [address]   Gastrointestinal Panel by PCR , Stool     Status: Abnormal   Collection Time: 02/27/23  4:29 PM   Specimen: Stool  Result Value Ref Range Status   Campylobacter species NOT DETECTED NOT DETECTED Final   Plesimonas shigelloides NOT DETECTED NOT DETECTED Final   Salmonella species NOT DETECTED NOT DETECTED Final   Yersinia enterocolitica NOT DETECTED NOT DETECTED Final   Vibrio species NOT DETECTED NOT DETECTED Final   Vibrio cholerae NOT DETECTED NOT DETECTED Final   Enteroaggregative E coli (EAEC) NOT DETECTED NOT DETECTED Final   Enteropathogenic E coli (EPEC) NOT DETECTED NOT DETECTED Final   Enterotoxigenic E coli (ETEC) NOT DETECTED NOT DETECTED Final   Shiga like toxin producing E coli (STEC) NOT DETECTED NOT DETECTED Final   Shigella/Enteroinvasive E coli (EIEC) NOT DETECTED NOT DETECTED Final   Cryptosporidium NOT DETECTED NOT DETECTED Final   Cyclospora cayetanensis NOT DETECTED NOT DETECTED Final   Entamoeba histolytica NOT DETECTED NOT DETECTED Final   Giardia lamblia NOT DETECTED NOT DETECTED Final   Adenovirus F40/41 NOT DETECTED NOT DETECTED Final   Astrovirus NOT DETECTED NOT DETECTED Final   Norovirus GI/GII DETECTED (A) NOT DETECTED Final    Comment: RESULT CALLED TO, READ BACK BY AND  VERIFIED WITH: Cathe Mons RN [id number] 02/28/23 HNM    Rotavirus A NOT DETECTED NOT DETECTED Final   Sapovirus (I, II, IV, and V) NOT DETECTED NOT DETECTED Final    Comment: Performed at [facility], [address] [place], [place] [address]  C Difficile Quick Screen w PCR reflex     Status: None   Collection Time: 02/27/23  4:29 PM   Specimen: STOOL  Result Value Ref Range Status   C Diff antigen NEGATIVE NEGATIVE Final   C Diff toxin NEGATIVE NEGATIVE Final   C Diff interpretation No C. difficile detected.  Final    Comment: Performed at [facility], [address], [place], [place] [address]    Rexene Alberts, MSN, NP-C [facility]  [email or web address] Pager: [phone] Office: [phone] [facility] Main Line: [phone] *Secure Chat Communication Welcome

## 2023-02-28 NOTE — Inpatient Diabetes Management (Signed)
Inpatient Diabetes Program Recommendations  AACE/ADA: New Consensus Statement on Inpatient Glycemic Control (2015)  Target Ranges:  Prepandial:   less than 140 mg/dL      Peak postprandial:   less than 180 mg/dL (1-2 hours)      Critically ill patients:  140 - 180 mg/dL   Lab Results  Component Value Date   GLUCAP 102 (H) 02/28/2023   HGBA1C 7.2 (H) 01/28/2023    Review of Glycemic Control  Latest Reference Range & Units 02/28/23 03:43 02/28/23 09:24 02/28/23 10:12  Glucose-Capillary 70 - 99 mg/dL 80 69 (L) 161 (H)   Diabetes history: DM2 Outpatient Diabetes medications:  Decadron 1 mg q 12 hours Novolog 0-20 units q 4 hours Semglee 16 units bid Current orders for Inpatient glycemic control:  Novolog 0-15 units q 4 hours Vital 40 ml/hr Decadron 1 mg IV q 12 hours Inpatient Diabetes Program Recommendations:   Note hypoglycemia.  Consider reducing Novolog correction to very sensitive (0-6 units) q 4 hours.   Thanks,  Lorenza Cambridge, RN, BC-ADM Inpatient Diabetes Coordinator Pager [phone]

## 2023-02-28 NOTE — Progress Notes (Signed)
Initial Nutrition Assessment  DOCUMENTATION CODES:   Non-severe (moderate) malnutrition in context of chronic illness  INTERVENTION:   Tube Feeding via PEG:  Vital AF 1.2 at 20 ml/hr today Goal: Vital AF 1.2 at  65 ml/hr TF at goal 1872 kcals, 117 g of protein 1264 mL of free water  Free water flush of 200 mL q 4 hours plus TF provides total of 2464 mL   NUTRITION DIAGNOSIS:   Moderate Malnutrition related to chronic illness as evidenced by moderate muscle depletion, mild muscle depletion, edema.  GOAL:   Patient will meet greater than or equal to 90% of their needs   MONITOR:   TF tolerance, Vent status  REASON FOR ASSESSMENT:   Consult Enteral/tube feeding initiation and management, Assessment of nutrition requirement/status  ASSESSMENT:   68 yo male admitted with shock with sepsis and acute GIB, AKI on CKD 3. Pt with recent discharge from [facility] on 02/14/23. PMH includes CVA with resultant dysphagia, aphasia and bed bound status with PEG in place, advanced dementia, HTN, HLD, DM, CKD 2, GERD  2/16 Admitted  Remains on levophed, pt sleeping and snoring on visit but moans when stimulated and sometimes screams out in pain  CT abdomen today.  PEG in place, small amount of drainage from PEG tube, increases with RN pushes on belly. Thick and tan/green in color.   Noted multiple episodes of hypoglycemia. Noted D5 in LR at 50 initiated yesterday x 24 hours  C.diff negative, +Norovirus. +small amount of dark red stool, Hgb stable. GI signed off  Current wt 93.8 kg; noted wt 89.5 kg 2/04. Weight in November 2024 around 110 kg but wt of 91.8 kg in December 2024. Unclear wt trend   Labs: CBGs 62-102, BUN 83, Creatinine 2.14, albumin <1.5, sodium 141 Meds: ss novolog, sodium bicarbonate, decadron, folic acid,   NUTRITION - FOCUSED PHYSICAL EXAM:  Flowsheet Row Most Recent Value  Orbital Region Mild depletion  Upper Arm Region Unable to assess  Thoracic and Lumbar  Region Unable to assess  Buccal Region Mild depletion  Temple Region Moderate depletion  Clavicle Bone Region Moderate depletion  Clavicle and Acromion Bone Region Moderate depletion  Scapular Bone Region Moderate depletion  Dorsal Hand Unable to assess  Patellar Region Unable to assess  Anterior Thigh Region Unable to assess  Posterior Calf Region Unable to assess  Edema (RD Assessment) Moderate       Diet Order:   Diet Order             Diet NPO time specified  Diet effective now                   EDUCATION NEEDS:   Not appropriate for education at this time  Skin:  Skin Assessment: Skin Integrity Issues: Skin Integrity Issues:: Stage II Stage II: sacrum  Last BM:  2/18 dark red stool  Height:   Ht Readings from Last 1 Encounters:  01/28/23 5\' 9"  (1.753 m)    Weight:   Wt Readings from Last 1 Encounters:  02/28/23 93.8 kg     BMI:  Body mass index is 30.54 kg/m.  Estimated Nutritional Needs:   Kcal:  1800-2000 kcals  Protein:  110-130 g  Fluid:  >/= 2L    Romelle Starcher MS, RDN, LDN, CNSC Registered Dietitian 3 Clinical Nutrition RD Inpatient Contact Info in [place] '

## 2023-02-28 NOTE — Progress Notes (Signed)
Date and time results received: 02/28/23 1352 (use smartphrase ".now" to insert current time)  Test: GI Panel Critical Value: + Norovirus  Name of Provider Notified: Chestine Spore, DO  Orders Received? Or Actions Taken?:  Keep patient on enteric precautions

## 2023-02-28 NOTE — Consult Note (Signed)
Palliative Care Consult Note                                  Date: 02/28/2023   Patient Name: [name]  DOB: December 24, 1955  MRN: [id number]  Age / Sex: 68 y.o., male  PCP: Benetta Spar, MD Referring Physician: Steffanie Dunn, DO  Reason for Consultation: Establishing goals of care  HPI/Patient Profile: 68 y.o. male  with past medical history of CVA with resultant aphasia, dysphagia, and bedbound status as well as advanced dementia, hypertension, hyperlipidemia, type 2 diabetes with hyperglycemia, CKD 2, GERD.  He presented from [facility] where he was admitted after previous hospitalization actively being treated for sepsis.  He presented with episode of hypotension and large melanotic stool earlier.  He was admitted on 02/26/2023 with shock (secondary to GI bleed versus sepsis, favored to be sepsis), acute GI bleed with blood loss anemia, hemorrhagic shock with lactic acidosis, MSSA pneumonia, metabolic acidosis, chronic HFrEF, and others.   Palliative medicine was consulted for GOC conversations.  Palliative medicine previously saw the patient during last admission in January at [facility].  Goals at that time were to allow a trial of 6 to 8 weeks of long-term acute care to see if there could be meaningful improvement/recovery.  Past Medical History:  Diagnosis Date   Bulging lumbar disc    CAD (coronary artery disease) [phone]   a. prior LAD stenting. b. s/p DES to [facility] 08/2015. c. 04/2016 Cardiac cath at [facility]. Patent stent in the PLAD and RCA. Diffuse dLAD, OM2, and  RPDA disease. d. DES to PDA and distal RCA 03/2019    Chronic lower back pain    CKD (chronic kidney disease), stage II    Cocaine abuse (HCC)    Cyst of epididymis    DM2 (diabetes mellitus, type 2) (HCC)    Essential hypertension    Fournier gangrene    GERD (gastroesophageal reflux disease)    Headache    History of pneumonia     Hyperlipidemia    Ischemic cardiomyopathy    LV (left ventricular) mural thrombus    Sleep apnea     Subjective:   This NP Wynne Dust reviewed medical records, received report from team, assessed the patient and then meet at the patient's bedside to discuss diagnosis, prognosis, GOC, EOL wishes disposition and options.  I met with the patient at the bedside, no family was present.  He is unable to meaningfully communicate.  I called and spoke with [name] later in the afternoon.   We meet to discuss diagnosis prognosis, GOC, EOL wishes, disposition and options. Concept of Palliative Care was introduced as specialized medical care for people and their families living with serious illness.  If focuses on providing relief from the symptoms and stress of a serious illness.  The goal is to improve quality of life for both the patient and the family. Values and goals of care important to patient and family were attempted to be elicited.  Created space and opportunity for patient  and family to explore thoughts and feelings regarding current medical situation   Natural trajectory and current clinical status were discussed. Questions and concerns addressed. Patient  encouraged to call with questions or concerns.    Patient/Family Understanding of Illness: She understands that he had a CVA in January and previously could say some things.  Prior sepsis  he was getting out basic statements.  She thinks that pain medicine is made this a lip more difficult, although she does want him medicated appropriately for pain.  She knows he is septic and has had pneumonia "for the longest" likely since January.  She notes that he came in with bleeding.  We spent some time reviewing the clinical situation in detail to give her a better understanding of her fathers clinical situation.  Life Review: Copied from previous consult note: "[name] has 2 children, daughter [name] who lives in [place] and a son who  is special needs [name] has guardianship)."  Goals: After our discussion today, time for outcomes to see if he can improve from his acute illness.  Overarching goals are 6 to 8 weeks time to see if he can make meaningful improvement.  Today's Discussion: In addition to discussions described above we had extensive discussion of various topics.  We spent time talking about the fact that he has been in and out of the hospital since October.  We discussed his previous CVA.  His baseline to [facility] was that he was "out of it" but was able to understand his daughter calling his name.  He was medicated for pain at that time as well.  Baseline at [facility] was sleeping most of the time secondary to pain which she states is primarily in his feet (apparently he has a bad case of gout), back pain, screws in his leg, testicular pain of unknown etiology for the last 2 to 3 years, and bedsores.  We discussed that any of these could be contributing to his current pain situation.  We discussed previous plan during last hospitalization for 6 to 8 weeks trial with PEG tube and ongoing acute care to see if he can make a meaningful recovery.  We discussed that this current hospitalization is a hiccup and that overall plan and we will need to see how he does to determine if this is still a realistic goal.  We discussed that we would give him a couple days for outcomes with his acute sepsis and I would likely follow-up Thursday (in 2 days).  I shared contact information and she can contact us for any questions or concerns.  I provided emotional and general support through therapeutic listening, empathy, sharing of stories, and other techniques. I answered all questions and addressed all concerns to the best of my ability.  Review of Systems  Unable to perform ROS: Mental status change    Objective:   Primary Diagnoses: Present on Admission:  Hemorrhagic shock (HCC)  Protein calorie malnutrition (HCC)  Acute  metabolic encephalopathy   Physical Exam Vitals and nursing note reviewed.  Constitutional:      General: He is in acute distress (Intermittently moaning in pain, medicated by nurse).     Appearance: He is ill-appearing.  HENT:     Head: Normocephalic and atraumatic.  Cardiovascular:     Rate and Rhythm: Normal rate.  Pulmonary:     Effort: Pulmonary effort is normal. No respiratory distress.  Abdominal:     General: Abdomen is flat.     Palpations: Abdomen is soft.  Skin:    General: Skin is warm and dry.  Neurological:     Mental Status: He is alert. He is disoriented and confused.     Vital Signs:  BP (!) 104/59 (BP Location: Right Arm)   Pulse 92   Temp 98.4 F (36.9 C) (Bladder)   Resp 18  Wt 93.8 kg   SpO2 100%   BMI 30.54 kg/m   Palliative Assessment/Data: 10%    Advanced Care Planning:   Existing Vynca/ACP Documentation: GOC document signed 02/01/2023  Primary Decision Maker: NEXT OF KIN  Code Status/Advance Care Planning: DNR-interventions desired  A discussion was had today regarding advanced directives. Concepts specific to code status, artifical feeding and hydration, continued IV antibiotics and rehospitalization was had.  The difference between a aggressive medical intervention path and a palliative comfort care path for this patient at this time was had.   Decisions/Changes to ACP: None today  Assessment & Plan:   Impression: 68 year old male with acute presentation chronic comorbidities as described above.  The patient was at [facility] long-term acute care for ongoing management status post CVA and pneumonia.  He presented with likely septic shock, acute GI bleed felt by GI to be likely due to hypotension/ischemic bowel.  He is currently quite sick, unable to meaningfully communicate.  I established contact with the patient's daughter and discussed goals described above.  At this point we will give some time for outcomes over the next couple  days to see how he does with his sepsis picture.  We will continue to follow for ongoing GOC discussions pending evolution of his clinical picture.  Overall prognosis poor.  SUMMARY OF RECOMMENDATIONS   DNR-interventions desired Continue full scope of care otherwise Time for outcomes Palliative medicine will follow-up in 2 days (03/02/2023) for ongoing GOC conversations  Symptom Management:  Per primary team PMD is available to assist as needed  Prognosis:  Unable to determine  Discharge Planning:  To Be Determined   Discussed with: Patient's family, medical team, nursing team    Thank you for allowing [place] to participate in the care of ELIGA ARVIE PMT will continue to support holistically.  Time Total: 102 min  Detailed review of medical records (labs, imaging, vital signs), medically appropriate exam, discussed with treatment team, counseling and education to patient, family, & staff, documenting clinical information, medication management, coordination of care  Signed by: Wynne Dust, NP Palliative Medicine Team  Team Phone # [phone] (Nights/Weekends)  02/28/2023, 5:25 PM

## 2023-03-01 ENCOUNTER — Inpatient Hospital Stay (HOSPITAL_COMMUNITY): Payer: No Typology Code available for payment source

## 2023-03-01 DIAGNOSIS — B49 Unspecified mycosis: Secondary | ICD-10-CM

## 2023-03-01 DIAGNOSIS — R739 Hyperglycemia, unspecified: Secondary | ICD-10-CM

## 2023-03-01 DIAGNOSIS — A419 Sepsis, unspecified organism: Secondary | ICD-10-CM | POA: Diagnosis not present

## 2023-03-01 DIAGNOSIS — J15211 Pneumonia due to Methicillin susceptible Staphylococcus aureus: Secondary | ICD-10-CM | POA: Diagnosis not present

## 2023-03-01 DIAGNOSIS — A0811 Acute gastroenteropathy due to Norwalk agent: Secondary | ICD-10-CM

## 2023-03-01 DIAGNOSIS — N182 Chronic kidney disease, stage 2 (mild): Secondary | ICD-10-CM

## 2023-03-01 DIAGNOSIS — N390 Urinary tract infection, site not specified: Secondary | ICD-10-CM

## 2023-03-01 DIAGNOSIS — N179 Acute kidney failure, unspecified: Secondary | ICD-10-CM

## 2023-03-01 DIAGNOSIS — J15212 Pneumonia due to Methicillin resistant Staphylococcus aureus: Secondary | ICD-10-CM

## 2023-03-01 DIAGNOSIS — R6521 Severe sepsis with septic shock: Secondary | ICD-10-CM | POA: Diagnosis not present

## 2023-03-01 LAB — BASIC METABOLIC PANEL
Anion gap: 13 (ref 5–15)
BUN: 71 mg/dL — ABNORMAL HIGH (ref 8–23)
CO2: 18 mmol/L — ABNORMAL LOW (ref 22–32)
Calcium: 8.2 mg/dL — ABNORMAL LOW (ref 8.9–10.3)
Chloride: 109 mmol/L (ref 98–111)
Creatinine, Ser: 1.83 mg/dL — ABNORMAL HIGH (ref 0.61–1.24)
GFR, Estimated: 40 mL/min — ABNORMAL LOW (ref >60)
Glucose, Bld: 120 mg/dL — ABNORMAL HIGH (ref 70–99)
Potassium: 4.6 mmol/L (ref 3.5–5.1)
Sodium: 140 mmol/L (ref 135–145)

## 2023-03-01 LAB — EXPECTORATED SPUTUM ASSESSMENT W GRAM STAIN, RFLX TO RESP C

## 2023-03-01 LAB — CBC
HCT: 25.2 % — ABNORMAL LOW (ref 39.0–52.0)
Hemoglobin: 7.9 g/dL — ABNORMAL LOW (ref 13.0–17.0)
MCH: 25.6 pg — ABNORMAL LOW (ref 26.0–34.0)
MCHC: 31.3 g/dL (ref 30.0–36.0)
MCV: 81.6 fL (ref 80.0–100.0)
Platelets: 108 10*3/uL — ABNORMAL LOW (ref 150–400)
RBC: 3.09 MIL/uL — ABNORMAL LOW (ref 4.22–5.81)
RDW: 23.1 % — ABNORMAL HIGH (ref 11.5–15.5)
WBC: 32.8 10*3/uL — ABNORMAL HIGH (ref 4.0–10.5)
nRBC: 0.3 % — ABNORMAL HIGH (ref 0.0–0.2)

## 2023-03-01 LAB — GLUCOSE, CAPILLARY
Glucose-Capillary: 102 mg/dL — ABNORMAL HIGH (ref 70–99)
Glucose-Capillary: 116 mg/dL — ABNORMAL HIGH (ref 70–99)
Glucose-Capillary: 120 mg/dL — ABNORMAL HIGH (ref 70–99)
Glucose-Capillary: 128 mg/dL — ABNORMAL HIGH (ref 70–99)
Glucose-Capillary: 130 mg/dL — ABNORMAL HIGH (ref 70–99)
Glucose-Capillary: 135 mg/dL — ABNORMAL HIGH (ref 70–99)
Glucose-Capillary: 147 mg/dL — ABNORMAL HIGH (ref 70–99)

## 2023-03-01 LAB — MAGNESIUM: Magnesium: 2 mg/dL (ref 1.7–2.4)

## 2023-03-01 LAB — PHOSPHORUS: Phosphorus: 5.1 mg/dL — ABNORMAL HIGH (ref 2.5–4.6)

## 2023-03-01 MED ORDER — DICLOFENAC SODIUM 1 % EX GEL
4.0000 g | Freq: Four times a day (QID) | CUTANEOUS | Status: DC
  Administered 2023-03-01 – 2023-03-02 (×7): 4 g via TOPICAL
  Filled 2023-03-01: qty 100

## 2023-03-01 MED ORDER — FREE WATER
200.0000 mL | Freq: Four times a day (QID) | Status: DC
  Administered 2023-03-01 – 2023-03-02 (×6): 200 mL

## 2023-03-01 MED ORDER — DAPTOMYCIN-SODIUM CHLORIDE 700-0.9 MG/100ML-% IV SOLN
700.0000 mg | Freq: Every day | INTRAVENOUS | Status: DC
  Administered 2023-03-01: 700 mg via INTRAVENOUS
  Filled 2023-03-01 (×2): qty 100

## 2023-03-01 MED ORDER — METHOCARBAMOL 500 MG PO TABS
500.0000 mg | ORAL_TABLET | Freq: Three times a day (TID) | ORAL | Status: DC | PRN
  Administered 2023-03-01 – 2023-03-02 (×3): 500 mg
  Filled 2023-03-01 (×3): qty 1

## 2023-03-01 MED ORDER — ALBUMIN HUMAN 25 % IV SOLN
25.0000 g | Freq: Four times a day (QID) | INTRAVENOUS | Status: AC
  Administered 2023-03-01 – 2023-03-02 (×4): 25 g via INTRAVENOUS
  Filled 2023-03-01 (×4): qty 100

## 2023-03-01 MED ORDER — HYDROMORPHONE HCL 1 MG/ML IJ SOLN
0.5000 mg | INTRAMUSCULAR | Status: DC | PRN
  Administered 2023-03-01 – 2023-03-02 (×5): 0.5 mg via INTRAVENOUS
  Filled 2023-03-01 (×5): qty 0.5

## 2023-03-01 MED ORDER — DOXYCYCLINE HYCLATE 100 MG IV SOLR
100.0000 mg | Freq: Two times a day (BID) | INTRAVENOUS | Status: DC
  Administered 2023-03-01 – 2023-03-02 (×2): 100 mg via INTRAVENOUS
  Filled 2023-03-01 (×3): qty 100

## 2023-03-01 MED ORDER — HALOPERIDOL LACTATE 5 MG/ML IJ SOLN: 2.0000 mg | Freq: Four times a day (QID) | INTRAMUSCULAR | Status: DC | PRN

## 2023-03-01 MED ORDER — QUETIAPINE FUMARATE 25 MG PO TABS
12.5000 mg | ORAL_TABLET | Freq: Every day | ORAL | Status: DC
  Administered 2023-03-01: 12.5 mg
  Filled 2023-03-01 (×2): qty 1

## 2023-03-01 MED ORDER — ZINC OXIDE 40 % EX OINT
TOPICAL_OINTMENT | Freq: Two times a day (BID) | CUTANEOUS | Status: DC | PRN
  Filled 2023-03-01: qty 57

## 2023-03-01 MED ORDER — LIDOCAINE 5 % EX PTCH
1.0000 | MEDICATED_PATCH | CUTANEOUS | Status: DC
  Administered 2023-03-01 – 2023-03-02 (×2): 1 via TRANSDERMAL
  Filled 2023-03-01 (×2): qty 1

## 2023-03-01 NOTE — Consult Note (Signed)
WOC Nurse Consult Note: Reason for Consult: assess around PEG tube  Wound type: no wound, assessed PEG, moderate brown drainage on sponge, no real skin breakdown at time of this visit  Pressure Injury POA: NA  Measurement: NA  Wound bed: NA  Drainage (amount, consistency, odor) moderate tan  Periwound: largely intact, minimal erythema from exudate on skin  Dressing procedure/placement/frequency: I cleaned around PEG tube with Vashe wound cleanser and placed new split gauze.  If drainage is heavy around PEG tube can clean with Vashe and place Drawtex superabsorbent [place] [phone] instead of split gauze. May also use a thin layer of Desitin to skin around PEG if irritation worsens.    POC discussed with bedside nurse. WOC team will not follow. Re-consult if further needs arise.   Thank you,    Priscella Mann MSN, RN-BC, [employer] [phone]

## 2023-03-01 NOTE — TOC Initial Note (Addendum)
Transition of Care [facility]) - Initial/Assessment Note    Patient Details  Name: [name] MRN: [id number] Date of Birth: 01-04-1956  [facility] CM/SW Contact:    Gala Lewandowsky, RN Phone Number: 03/01/2023, 11:05 AM  Clinical Narrative: Risk for readmission assessment completed. Patient presented for acute GI Bleed. PTA patient was from [facility]. Case Manager did speak with daughter [name] and she is agreeable for patient to return to LTAC once stable. Hospital Liaison is following the patient. Case Manager will continue to follow for additional transition of care needs.                   Expected Discharge Plan: Long Term Acute Care (LTAC) Barriers to Discharge: Continued Medical Work up  Expected Discharge Plan and Services   Discharge Planning Services: CM Consult   Living arrangements for the past 2 months:  ([facility])  Prior Living Arrangements/Services Living arrangements for the past 2 months:  ([facility] [employer] [facility] (physical sciences)) Lives with:: Facility Resident Patient language and need for interpreter reviewed:: Yes Do you feel safe going back to the place where you live?: Yes      Need for Family Participation in Patient Care: Yes (Comment) Care giver support system in place?: Yes (comment)   Criminal Activity/Legal Involvement Pertinent to Current Situation/Hospitalization: No - Comment as needed   Permission Sought/Granted Permission sought to share information with : Family Supports, Case Manager      Emotional Assessment Appearance:: Appears stated age Attitude/Demeanor/Rapport: Unable to Assess Affect (typically observed): Unable to Assess   Alcohol / Substance Use: Not Applicable Psych Involvement: No (comment)  Admission diagnosis:  Hemorrhagic shock (HCC) [R57.8] Patient Active Problem List   Diagnosis Date Noted   Hemorrhagic shock (HCC) 02/27/2023   Gastrointestinal hemorrhage with melena 02/27/2023   Malnutrition of moderate degree  02/07/2023   Altered mental status 02/02/2023   Dysphagia 02/02/2023   Dysphagia due to recent cerebrovascular accident (CVA) 02/02/2023   S/P percutaneous endoscopic gastrostomy (PEG) tube placement 02/02/23 02/02/2023   Acute gout 01/31/2023   Pressure injury of skin 01/31/2023   Acute metabolic encephalopathy 01/28/2023   Acute on chronic combined systolic (congestive) and diastolic (congestive) heart failure (HCC) 01/14/2023   Frequent falls 01/14/2023   Diabetic nephropathy (HCC) 01/13/2023   Hidradenitis 01/13/2023   Hyperlipidemia, mixed 01/13/2023   Obesity 01/13/2023   Opioid dependence (HCC) 01/13/2023   Old anterior cruciate ligament disruption 01/13/2023   Spermatocele 01/13/2023   Spinal stenosis of lumbar region 01/13/2023   Varicocele 01/13/2023   Left ventricular thrombus 01/13/2023   Stimulant abuse (HCC) 01/13/2023   Chronic low back pain 01/13/2023   Hydrocele in adult 01/13/2023   CHF (congestive heart failure) (HCC) 01/13/2023   Osteoarthritis of knees, bilateral 01/13/2023   Chronic kidney disease, stage 3b (HCC) 12/21/2022   Perianal abscess 12/19/2022   Adenomatous polyp of transverse colon 11/11/2022   H/O medication noncompliance 11/09/2022   Pleural effusion 11/10/2021   Mediastinal lymphadenopathy 11/09/2021   Stroke (cerebrum)-left-sided hemiparesis and dysphagia 11/07/2021   Protein calorie malnutrition (HCC) 11/07/2021   Scrotal pain; chronic, bilateral 07/05/2021   Microscopic hematuria 07/05/2021   Organic impotence 07/05/2021   Opiate overdose (HCC)    Uncontrolled type 2 diabetes mellitus with hyperglycemia (HCC) 05/16/2019   Cocaine abuse (HCC) 05/15/2019   Apical mural thrombus 10/15/2018   Claudication in peripheral vascular disease (HCC) 11/30/2017   PAD (peripheral artery disease) (HCC) 11/28/2017   Acute kidney injury superimposed on chronic  kidney disease (HCC) 01/31/2016   Cocaine use 09/04/2015   Essential hypertension 06/29/2015    Status post coronary artery stent placement 06/29/2015   Insulin dependent diabetes mellitus 06/29/2015   History of MI (myocardial infarction) 06/29/2015   OSA on CPAP 01/21/2015   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 09/09/2013   Coronary artery disease 09/09/2013   Ischemic cardiomyopathy 09/09/2013   S/P LAD DES June 2014 09/06/2013   Tobacco use disorder 09/06/2013   Hypertensive heart disease    PCP:  Benetta Spar, MD Pharmacy:  No Pharmacies Listed  Social Drivers of Health (SDOH) Social History: SDOH Screenings   Food Insecurity: Patient Unable To Answer (02/27/2023)  Housing: Patient Unable To Answer (02/27/2023)  Transportation Needs: Patient Unable To Answer (02/27/2023)  Utilities: Patient Unable To Answer (02/27/2023)  Alcohol Screen: Low Risk  (02/14/2023)  Depression (PHQ2-9): Low Risk  (02/14/2023)  Financial Resource Strain: Low Risk  (02/14/2023)  Physical Activity: Sufficiently Active (02/14/2023)  Recent Concern: Physical Activity - Inactive (12/14/2022)  Social Connections: Patient Unable To Answer (02/27/2023)  Stress: No Stress Concern Present (02/14/2023)  Recent Concern: Stress - Stress Concern Present (12/14/2022)  Tobacco Use: High Risk (02/14/2023)  Health Literacy: Inadequate Health Literacy (02/14/2023)   SDOH Interventions:     Readmission Risk Interventions    03/01/2023   11:03 AM 02/13/2023   11:26 AM 02/13/2023   11:14 AM  Readmission Risk Prevention Plan  Transportation Screening Complete Complete Complete  Medication Review (RN Care Manager) Referral to Pharmacy Complete Complete  HRI or Home Care Consult Complete Complete Complete  SW Recovery Care/Counseling Consult Complete Complete Complete  Palliative Care Screening Not Applicable Complete Complete  Skilled Nursing Facility -- Complete Complete

## 2023-03-01 NOTE — Progress Notes (Signed)
Called patients Daughter [name] for daily updated, called was not answered. Voicemail left.   [name] D. Harris, NP-C [facility] Personal contact information can be found on Amion  If no contact or response made please call [phone] 03/01/2023, 2:00 PM

## 2023-03-01 NOTE — Progress Notes (Addendum)
NAME:  [name], MRN:  [id number], DOB:  Feb 09, 1955, LOS: 2 ADMISSION DATE:  02/26/2023, CONSULTATION DATE:  2331 REFERRING MD:  EDP, CHIEF COMPLAINT:  acute GIB   History of Present Illness:  [name] presented after being actively treated for sepsis (MSSA/Ecoli and bacteremia, diagnosed and treated at [facility]) , recent discharged from [facility] on 02/14/23. He had an episode of hypotension after a large melanotic stool earlier today. Per report from EDP, as pt is aphasic 2/2 CVA, [facility] (his [facility]) reported that pt was improving from a septic standpoint until today. Pt is on no chronic a/c but is on ASA per his peg tube. His facility reportedly started volume resuscitation while awaiting transport to ED. Pt had mild improvement in BP but has ultimately required initiation of vasopressors via piv. His hgb was found to be 6.1. EDP ordered 2 U PRBC at this time and is contacting GI for consultation.   Recent PEG placement 02/02/23. Recent c-scope (inadequate prep) in 11/2022. Had multiple poolyps removed, as large as 25mm.   All history is obtained from chart review as pt is unable to provide any 2/2 baseline status.   Ccm was asked to admit 2/2 need for vasopressors at this time.   Pertinent  Medical History  H/o cva with resultant aphasia, dysphagia and bedbound status Advanced dementia Htn Hyperlipidemia T2dm with hyperglycemia Ckd2 gerd  Significant Hospital Events: Including procedures, antibiotic start and stop dates in addition to other pertinent events   2/16 Admitted to ICU. 2 units pRBC given at admission.  2/17 1 unit pRBC, CVC placed 2/18 More lethargic this morning. Less rectal output, still bloody.  2/19 no acute events overnight, remains on low-dose Levophed and mildly hypoxic this a.m.  Interim History / Subjective:  Seen lying in bed, moaning to any verbal or physical stimuli  Objective   Blood pressure 97/61, pulse (!) 107, temperature 98.6 F (37 C),  temperature source Bladder, resp. rate 14, weight 98 kg, SpO2 96%. CVP:  [1 mmHg-32 mmHg] 11 mmHg      Intake/Output Summary (Last 24 hours) at 03/01/2023 3244 Last data filed at 03/01/2023 0700 Gross per 24 hour  Intake 2038.15 ml  Output 1560 ml  Net 478.15 ml   Filed Weights   02/28/23 0510 03/01/23 0500  Weight: 93.8 kg 98 kg    Examination: General: Acute on chronic ill-appearing deconditioned elderly male lying in bed in mild discomfort but no acute distress HEENT: [place]/AT, MM pink/moist, PERRL,  Neuro: Opens eyes to verbal stimuli, tracks movements, does not follow commands CV: s1s2 regular rate and rhythm, no murmur, rubs, or gallops,  PULM: Rhonchi, thick pink frothy secretions on 6 L nasal cannula, no increased work of breathing GI: soft, bowel sounds active in all 4 quadrants, non-tender, non-distended Extremities: warm/dry, no edema  Skin: no rashes or lesions  Resolved Hospital Problem list   Hyperkalemia  Assessment & Plan:  Mixed shock with component of septic and hemorrhagic shock -Hemoglobin on admission 5.4, s/p 3 units PRBC.  -Known MSSA pneumonia and E. coli UTI, POA P: Continue pressors for MAP goal greater than 65 Strict intake and output Trend lactic acid  MSSA pneumonia, POA -Dense bibasilar consolidations seen on CTA abdomen and pelvis E coli UTI, subacute, POA -Present on admission. No bacteremia present based on cultures sent from [facility].  -MRSA PCR positive on admission Candida Glabrata bacteremia  P: At risk for intubation given progressive hypoxia, currently requiring 6 L nasal  cannula with bilateral rhonchi Repeat chest x-ray  Micafungin started 2/18 Follow sputum culture once obtained Continue linezolid  Meropenem stopped 2/18 per ID  Encourage pulmonary hygiene as able NTS suctioning as needed Gentle diuresing today  Acute GIB likely secondary to infectious enteritis Positive neurovirus, POA Acute blood loss anemia on chronic  anemia, s/p 3 units pRBC P: GI evaluated with no plans for inpatient endoscopies, appreciate assistance Trend CBC Transfuse per protocol Hemoglobin goal greater than 7 Supportive care, monitor intake and output Continue home aspirin as below Twice daily PPI If renal function continues to improve can consider CT  H/o CVA with chronic deficits, PEG dependent P: Neuroprotective measures Minimize sedation Delirium precautions Aspiration precautions Continue home aspirin  AKI on CKD2; mildly improved.  Metabolic acidosis with lactic acidosis; slowly improving P: Strict taken output Avoid nephrotoxins Monitor urine output Continue enteral bicarb  DM2 with Hyperglycemia -A1c 7.2 P: SSI CBG goal 140-180 CBG checks every 4  Chronic HFrEF -EF 35-40%> improved from last month 25-30%.  P: Continuous telemetry  Aspirin as above Strict intake and output Daily weight Diurese as able, appears intravascularly dry today Closely monitor renal function and electrolytes Supplemental oxygen support as above Resume home GDMT as able Continuous telemetry  Elevated transaminases Elevated alk phos Hyperbilirubinemia due to acute GIB vs sepsis - RUQ reassuring for no acute biliary disease P: Intermittently trend LFTs Avoid hepatotoxins  Thrombocytopenia due to sepsis P: Trend CBC Monitor for signs of bleeding Transfuse for platelets greater than 10  Moderate protein energy malnutrition History of TF intolerance requiring reglan Dysphagia -last seen by SLP 02/10/23, recommended NPO P: Continue tube feeds, advance rate as able  Intractable pain -Still screams when he wakes up P: As needed oxycodone and Dilaudid  Decubitus ulcer sacrum; stage 2, POA Skin tear mid-penis -wound care frequent turns P: Optimize nutrition Pressure living devices Frequent PERI care   Best Practice (right click and "Reselect all SmartList Selections" daily)   Diet/type: tubefeeds DVT  prophylaxis SCD Pressure ulcer(s): present on admission  see RN assessment GI prophylaxis: PPI Lines: Central line Foley:  Yes, and it is still needed Code Status:  DNR but ok with intubation, pressors etc Last date of multidisciplinary goals of care discussion [2/18 with daughter [name]]  Critical care time:   CRITICAL CARE Performed by: Dempsy Damiano D. Harris   Total critical care time: 40 minutes  Critical care time was exclusive of separately billable procedures and treating other patients.  Critical care was necessary to treat or prevent imminent or life-threatening deterioration.  Critical care was time spent personally by me on the following activities: development of treatment plan with patient and/or surrogate as well as nursing, discussions with consultants, evaluation of patient's response to treatment, examination of patient, obtaining history from patient or surrogate, ordering and performing treatments and interventions, ordering and review of laboratory studies, ordering and review of radiographic studies, pulse oximetry and re-evaluation of patient's condition.  Michiel Sivley D. Harris, NP-C [facility] Personal contact information can be found on Amion  If no contact or response made please call [phone] 03/01/2023, 8:18 AM

## 2023-03-01 NOTE — Progress Notes (Incomplete)
[name]  Date of Admission:  02/26/2023      Total days of antibiotics ***        Day ***        Day ***        Day *** ASSESSMENT: MAVERIC [name] is a 68 y.o. male admitted from [name] with -     Staph Aureus Bacteremia (OSH) -  E Coli UTI, ESBL (OSH) -  Pneumonia with Dense B/L Consolidation -  Central line placed 2/16, arrival blood cultures are no growth to date. Alerted that one blood culture was positive for staph aureus with sensitivities still pending Started on vancomycin + piperacillin-tazobactam -- broadened to linezoild + meropenem with leukocytosis.  Suspect his UTI is adequately treated given cultures were from 2/11 and was receiving antibiotics at [facility]. Would consider stopping Meropenem after today.  2/12 MRSA in Sputum  2/11 Urine with E Coli (S-cipro, impenem, nitrofurantoin, R-TMP/S) 2/11 blood cultures 1/2 staph aureus TTE calcified aortic valve (not well visualized), MV normal, TV normal, PV normal.  -Agree to continue the linezolid for safer option given his refusal for labs on Vancomycin and what seems to be cleared bacteremia from PTA. Need to watch platelets though... -Presume staph aureus 2/2 pneumonia.  -consider stopping meropenem after today.    Hypotension with Acute GI Hemorrhage -  Norovirus ( + )   Being transfused - CBC q6h per PCCM / GI team. Now S/P 3 units pRBC. Suspect that this was triggered by infectious enteritis. Stable H/H - treating medically for now w/o scope. GI signed off.  -Enteric precautions   Norovirus ( + )   Enteric precautions and supportive care to avoid dehydration    Meningioma on Steroids -  Ongoing decadron noted 1mg  BID - should have been tapering off per NSGY recommendations.  Suspect this may be contributing to leukocytosis  -is there a reason to continue this?  PLAN: ***  Principal Problem:   Hemorrhagic shock (HCC) Active Problems:   Protein calorie  malnutrition (HCC)   Acute metabolic encephalopathy   Gastrointestinal hemorrhage with melena    aspirin  81 mg Per Tube Daily   atorvastatin  40 mg Per Tube Daily   Chlorhexidine Gluconate Cloth  6 each Topical Daily   dexamethasone (DECADRON) injection  1 mg Intravenous Q12H   feeding supplement (PROSource TF20)  60 mL Per Tube Daily   feeding supplement (VITAL AF 1.2 CAL)  1,000 mL Per Tube Q24H   folic acid  1 mg Per Tube Daily   free water  200 mL Per Tube Q4H   gabapentin  100 mg Per Tube QHS   insulin aspart  1-3 Units Subcutaneous Q4H   linezolid  600 mg Per Tube Q12H   mupirocin ointment  1 Application Nasal BID   pantoprazole (PROTONIX) IV  40 mg Intravenous Q8H   Followed by   [START ON 03/02/2023] pantoprazole (PROTONIX) IV  40 mg Intravenous Q12H   sodium bicarbonate  650 mg Per Tube TID   sodium chloride HYPERTONIC  4 mL Nebulization Q4H    SUBJECTIVE: ***  Review of Systems: ROS  Allergies  Allergen Reactions   Plavix [Clopidogrel] Other (See Comments)    Drowsiness Skin irritation    OBJECTIVE: Vitals:   03/01/23 0815 03/01/23 0830 03/01/23 0845 03/01/23 0900  BP: (!) 104/57 (!) 99/59 (!) 102/59 (!) 105/58  Pulse: 87 88 88 89  Resp:  14 14 17 16   Temp:      TempSrc:      SpO2: 94% 92% 91% 93%  Weight:       Body mass index is 31.91 kg/m.  Physical Exam  Lab Results Lab Results  Component Value Date   WBC 32.8 (H) 03/01/2023   HGB 7.9 (L) 03/01/2023   HCT 25.2 (L) 03/01/2023   MCV 81.6 03/01/2023   PLT 108 (L) 03/01/2023    Lab Results  Component Value Date   CREATININE 1.83 (H) 03/01/2023   BUN 71 (H) 03/01/2023   NA 140 03/01/2023   K 4.6 03/01/2023   CL 109 03/01/2023   CO2 18 (L) 03/01/2023    Lab Results  Component Value Date   ALT 68 (H) 02/28/2023   AST 62 (H) 02/28/2023   GGT 275 (H) 02/26/2023   ALKPHOS 245 (H) 02/28/2023   BILITOT 1.4 (H) 02/28/2023     Microbiology: Recent Results (from the past 240 hours)   Blood Culture (routine x 2)     Status: None (Preliminary result)   Collection Time: 02/26/23 10:05 PM   Specimen: BLOOD  Result Value Ref Range Status   Specimen Description BLOOD RIGHT ANTECUBITAL  Final   Special Requests   Final    BOTTLES DRAWN AEROBIC AND ANAEROBIC Blood Culture adequate volume   Culture   Final    NO GROWTH 3 DAYS Performed at [facility], [address], [place], [place] [address]    Report Status PENDING  Incomplete  Resp panel by RT-PCR (RSV, Flu A&B, Covid)     Status: None   Collection Time: 02/26/23 10:07 PM   Specimen: Nasal Swab  Result Value Ref Range Status   SARS Coronavirus 2 by RT PCR NEGATIVE NEGATIVE Final   Influenza A by PCR NEGATIVE NEGATIVE Final   Influenza B by PCR NEGATIVE NEGATIVE Final    Comment: (NOTE) The Xpert Xpress SARS-CoV-2/FLU/RSV plus assay is intended as an aid in the diagnosis of influenza from Nasopharyngeal swab specimens and should not be used as a sole basis for treatment. Nasal washings and aspirates are unacceptable for Xpert Xpress SARS-CoV-2/FLU/RSV testing.  Fact Sheet for Patients: BloggerCourse.com  Fact Sheet for Healthcare Providers: SeriousBroker.it  This test is not yet approved or cleared by the [place] FDA and has been authorized for detection and/or diagnosis of SARS-CoV-2 by FDA under an Emergency Use Authorization (EUA). This EUA will remain in effect (meaning this test can be used) for the duration of the COVID-19 declaration under Section 564(b)(1) of the Act, 21 U.S.C. section 360bbb-3(b)(1), unless the authorization is terminated or revoked.     Resp Syncytial Virus by PCR NEGATIVE NEGATIVE Final    Comment: (NOTE) Fact Sheet for Patients: BloggerCourse.com  Fact Sheet for Healthcare Providers: SeriousBroker.it  This test is not yet approved or cleared by the [place]  FDA and has been authorized for detection and/or diagnosis of SARS-CoV-2 by FDA under an Emergency Use Authorization (EUA). This EUA will remain in effect (meaning this test can be used) for the duration of the COVID-19 declaration under Section 564(b)(1) of the Act, 21 U.S.C. section 360bbb-3(b)(1), unless the authorization is terminated or revoked.  Performed at [name], [address] [place], [place] [address]   Blood Culture (routine x 2)     Status: None (Preliminary result)   Collection Time: 02/26/23 10:10 PM   Specimen: BLOOD  Result Value Ref Range Status  Specimen Description BLOOD LEFT ANTECUBITAL  Final   Special Requests   Final    BOTTLES DRAWN AEROBIC AND ANAEROBIC Blood Culture results may not be optimal due to an inadequate volume of blood received in culture bottles   Culture  Setup Time   Final    BUDDING YEAST SEEN ANAEROBIC BOTTLE ONLY CRITICAL RESULT CALLED TO, READ BACK BY AND VERIFIED WITH: PHARMDC. AMEND [id number] @1956  FH    Culture   Final    YEAST CULTURE REINCUBATED FOR BETTER GROWTH Performed at [facility], [address] [name], [place] [address]    Report Status PENDING  Incomplete  Blood Culture ID Panel (Reflexed)     Status: Abnormal   Collection Time: 02/26/23 10:10 PM  Result Value Ref Range Status   Enterococcus faecalis NOT DETECTED NOT DETECTED Final   Enterococcus Faecium NOT DETECTED NOT DETECTED Final   Listeria monocytogenes NOT DETECTED NOT DETECTED Final   Staphylococcus species NOT DETECTED NOT DETECTED Final   Staphylococcus aureus (BCID) NOT DETECTED NOT DETECTED Final   Staphylococcus epidermidis NOT DETECTED NOT DETECTED Final   Staphylococcus lugdunensis NOT DETECTED NOT DETECTED Final   Streptococcus species NOT DETECTED NOT DETECTED Final   Streptococcus agalactiae NOT DETECTED NOT DETECTED Final   Streptococcus pneumoniae NOT DETECTED NOT DETECTED Final   Streptococcus pyogenes NOT DETECTED NOT  DETECTED Final   A.calcoaceticus-baumannii NOT DETECTED NOT DETECTED Final   Bacteroides fragilis NOT DETECTED NOT DETECTED Final   Enterobacterales NOT DETECTED NOT DETECTED Final   Enterobacter cloacae complex NOT DETECTED NOT DETECTED Final   Escherichia coli NOT DETECTED NOT DETECTED Final   Klebsiella aerogenes NOT DETECTED NOT DETECTED Final   Klebsiella oxytoca NOT DETECTED NOT DETECTED Final   Klebsiella pneumoniae NOT DETECTED NOT DETECTED Final   Proteus species NOT DETECTED NOT DETECTED Final   Salmonella species NOT DETECTED NOT DETECTED Final   Serratia marcescens NOT DETECTED NOT DETECTED Final   Haemophilus influenzae NOT DETECTED NOT DETECTED Final   Neisseria meningitidis NOT DETECTED NOT DETECTED Final   Pseudomonas aeruginosa NOT DETECTED NOT DETECTED Final   Stenotrophomonas maltophilia NOT DETECTED NOT DETECTED Final   Candida albicans NOT DETECTED NOT DETECTED Final   Candida auris NOT DETECTED NOT DETECTED Final   Candida glabrata DETECTED (A) NOT DETECTED Final    Comment: CRITICAL RESULT CALLED TO, READ BACK BY AND VERIFIED WITH: PHARMDC. AMEND [id number] @1956  FH    Candida krusei NOT DETECTED NOT DETECTED Final   Candida parapsilosis NOT DETECTED NOT DETECTED Final   Candida tropicalis NOT DETECTED NOT DETECTED Final   Cryptococcus neoformans/gattii NOT DETECTED NOT DETECTED Final    Comment: Performed at [facility], [address], [place], [place] [address]  MRSA Next Gen by PCR, Nasal     Status: Abnormal   Collection Time: 02/27/23  2:47 AM   Specimen: Nasal Mucosa; Nasal Swab  Result Value Ref Range Status   MRSA by PCR Next Gen DETECTED (A) NOT DETECTED Final    Comment: RESULT CALLED TO, READ BACK BY AND VERIFIED WITH: R SARANE,RN@0424  02/27/23 MK (NOTE) The GeneXpert MRSA Assay (FDA approved for NASAL specimens only), is one component of a comprehensive MRSA colonization surveillance program. It is not intended to diagnose MRSA infection nor  to guide or monitor treatment for MRSA infections. Test performance is not FDA approved in patients less than 14 years old. Performed at [name], [address], [place], [place] [address]  Gastrointestinal Panel by PCR , Stool     Status: Abnormal   Collection Time: 02/27/23  4:29 PM   Specimen: Stool  Result Value Ref Range Status   Campylobacter species NOT DETECTED NOT DETECTED Final   Plesimonas shigelloides NOT DETECTED NOT DETECTED Final   Salmonella species NOT DETECTED NOT DETECTED Final   Yersinia enterocolitica NOT DETECTED NOT DETECTED Final   Vibrio species NOT DETECTED NOT DETECTED Final   Vibrio cholerae NOT DETECTED NOT DETECTED Final   Enteroaggregative E coli (EAEC) NOT DETECTED NOT DETECTED Final   Enteropathogenic E coli (EPEC) NOT DETECTED NOT DETECTED Final   Enterotoxigenic E coli (ETEC) NOT DETECTED NOT DETECTED Final   Shiga like toxin producing E coli (STEC) NOT DETECTED NOT DETECTED Final   Shigella/Enteroinvasive E coli (EIEC) NOT DETECTED NOT DETECTED Final   Cryptosporidium NOT DETECTED NOT DETECTED Final   Cyclospora cayetanensis NOT DETECTED NOT DETECTED Final   Entamoeba histolytica NOT DETECTED NOT DETECTED Final   Giardia lamblia NOT DETECTED NOT DETECTED Final   Adenovirus F40/41 NOT DETECTED NOT DETECTED Final   Astrovirus NOT DETECTED NOT DETECTED Final   Norovirus GI/GII DETECTED (A) NOT DETECTED Final    Comment: RESULT CALLED TO, READ BACK BY AND VERIFIED WITH: Cathe Mons RN [id number] 02/28/23 HNM    Rotavirus A NOT DETECTED NOT DETECTED Final   Sapovirus (I, II, IV, and V) NOT DETECTED NOT DETECTED Final    Comment: Performed at [name], [address] [place], [place] [address]  C Difficile Quick Screen w PCR reflex     Status: None   Collection Time: 02/27/23  4:29 PM   Specimen: STOOL  Result Value Ref Range Status   C Diff antigen NEGATIVE NEGATIVE Final   C Diff toxin NEGATIVE NEGATIVE Final   C Diff  interpretation No C. difficile detected.  Final    Comment: Performed at [facility], [address], [place], [place] [address]    Rexene Alberts, MSN, NP-C [name]  [email or web address] Pager: [phone] Office: [phone] [facility] Main Line: [phone] *Secure Chat Communication Welcome

## 2023-03-01 NOTE — Progress Notes (Signed)
[name] [name] called back for update.  We discussed current clinical course and projected outcome.  She was informed of the need to diagnosis of fungemia.   Given his multiple comorbidities with current fungemia and MSSA pneumonia discussed goals of care.  She confirms patient is a DO NOT INTUBATE and no no chest compressions but she would be okay with defibrillation's.  I expressed my concerns current comfort with poor quality of life moving forward.  At this time she was to continue current interventions.  Plans to travel and be at bedside next week, she resides in [place].  Malon Siddall D. Harris, NP-C [facility] Personal contact information can be found on Amion  If no contact or response made please call [phone] 03/01/2023, 2:47 PM

## 2023-03-01 NOTE — Progress Notes (Signed)
[facility]    Date of Admission:  02/26/2023     ID: [name] is a 68 y.o. male with   Principal Problem:   Hemorrhagic shock (HCC) Active Problems:   Protein calorie malnutrition (HCC)   Acute metabolic encephalopathy   Gastrointestinal hemorrhage with melena    Subjective: Afebrile, still requiring pressors, however still on low dose pressor support  Micro blood cx showing fungemia - micafungin added  Medications:   aspirin  81 mg Per Tube Daily   atorvastatin  40 mg Per Tube Daily   Chlorhexidine Gluconate Cloth  6 each Topical Daily   dexamethasone (DECADRON) injection  1 mg Intravenous Q12H   diclofenac Sodium  4 g Topical QID   feeding supplement (PROSource TF20)  60 mL Per Tube Daily   feeding supplement (VITAL AF 1.2 CAL)  1,000 mL Per Tube Q24H   folic acid  1 mg Per Tube Daily   free water  200 mL Per Tube Q6H   gabapentin  100 mg Per Tube QHS   insulin aspart  1-3 Units Subcutaneous Q4H   lidocaine  1 patch Transdermal Q24H   linezolid  600 mg Per Tube Q12H   mupirocin ointment  1 Application Nasal BID   pantoprazole (PROTONIX) IV  40 mg Intravenous Q8H   Followed by   [START ON 03/02/2023] pantoprazole (PROTONIX) IV  40 mg Intravenous Q12H   sodium bicarbonate  650 mg Per Tube TID   sodium chloride HYPERTONIC  4 mL Nebulization Q4H    Objective: Vital signs in last 24 hours: Temp:  [98.6 F (37 C)-98.8 F (37.1 C)] 98.6 F (37 C) (02/19 0400) Pulse Rate:  [84-116] 86 (02/19 1415) Resp:  [10-25] 12 (02/19 1415) BP: (68-108)/(45-76) 95/62 (02/19 1415) SpO2:  [89 %-100 %] 92 % (02/19 1415) Weight:  [98 kg] 98 kg (02/19 0500)  Physical Exam  Constitutional: He is oriented to person, place, and time. He appears well-developed and well-nourished. No distress.  HENT:  Mouth/Throat: Oropharynx is clear and moist. No oropharyngeal exudate.  Cardiovascular: Normal rate, regular rhythm and normal heart sounds. Exam  reveals no gallop and no friction rub.  No murmur heard.  Pulmonary/Chest: Effort normal and breath sounds normal. No respiratory distress. He has no wheezes.  Abdominal: Soft. Bowel sounds are normal. He exhibits no distension. There is no tenderness.  Lymphadenopathy:  He has no cervical adenopathy.  Neurological: He is alert and oriented to person, place, and time.  Skin: Skin is warm and dry. No rash noted. No erythema.  Psychiatric: He has a normal mood and affect. His behavior is normal.    Lab Results Recent Labs    02/28/23 0449 02/28/23 0952 03/01/23 0350  WBC 23.5* 24.3* 32.8*  HGB 8.3* 7.9* 7.9*  HCT 26.0* 24.8* 25.2*  NA 141  --  140  K 4.5  --  4.6  CL 111  --  109  CO2 16*  --  18*  BUN 83*  --  71*  CREATININE 2.14*  --  1.83*   Liver Panel Recent Labs    02/27/23 0536 02/28/23 0449  PROT 3.8* 5.0*  ALBUMIN <1.5* <1.5*  AST 80* 62*  ALT 89* 68*  ALKPHOS 305* 245*  BILITOT 2.0* 1.4*  BILIDIR  --  0.7*  IBILI  --  0.7     Microbiology: 2/16 blood cx +c.glabrata  Studies/Results: DG CHEST PORT 1 VIEW Result Date: 03/01/2023 CLINICAL DATA:  [id number] PNA (pneumonia) [id number] EXAM: PORTABLE CHEST 1 VIEW COMPARISON:  February 27, 2023 FINDINGS: The cardiomediastinal silhouette is unchanged in contour.RIGHT neck CVC tip terminates over the superior cavoatrial junction. No pleural effusion. No pneumothorax. Similar RIGHT perihilar predominant airspace opacities. Mildly increased confluence of the LEFT retrocardiac and LEFT perihilar predominant opacity IMPRESSION: Mildly increased confluence of the LEFT retrocardiac and LEFT perihilar predominant opacity. Similar appearance of RIGHT perihilar predominant consolidative opacities. Electronically Signed   By: Meda Klinefelter M.D.   On: 03/01/2023 13:29   CT ABDOMEN PELVIS WO CONTRAST Result Date: 02/28/2023 CLINICAL DATA:  Lower gastrointestinal bleed. EXAM: CT ABDOMEN AND PELVIS WITHOUT CONTRAST TECHNIQUE:  Multidetector CT imaging of the abdomen and pelvis was performed following the standard protocol without IV contrast. RADIATION DOSE REDUCTION: This exam was performed according to the departmental dose-optimization program which includes automated exposure control, adjustment of the mA and/or kV according to patient size and/or use of iterative reconstruction technique. COMPARISON:  CT abdomen without contrast is 01/20/2018 FINDINGS: Lower chest: There is dense bibasilar consolidation most suggestive of multifocal pneumonia. This consolidation is new from comparison CT. Hepatobiliary: No focal hepatic lesion. Normal gallbladder. No biliary duct dilatation. Common bile duct is normal. Pancreas: Pancreas is normal. No ductal dilatation. No pancreatic inflammation. Spleen: Normal spleen Adrenals/urinary tract: Adrenal glands kidneys ureters normal. Foley catheter within bladder. Stomach/Bowel: Percutaneous gastrostomy tube in the stomach. Small volume oral contrast bowel. No dilated large bowel or small bowel. Terminal ileum normal. High-density fluid within the rectum may relate to oral contrast. Vascular/Lymphatic: Abdominal aorta is normal caliber with atherosclerotic calcification. There is no retroperitoneal or periportal lymphadenopathy. No pelvic lymphadenopathy. Reproductive: Prostate unremarkable. Other: No free fluid. Musculoskeletal: No aggressive osseous lesion. IMPRESSION: 1. Dense bibasilar consolidation most suggestive of multifocal pneumonia. 2. Percutaneous gastrostomy tube in the stomach. 3. Dense fluid the rectum may relate to oral contrast. 4. Foley catheter within the bladder. 5.  Aortic Atherosclerosis (ICD10-I70.0). Electronically Signed   By: Genevive Bi M.D.   On: 02/28/2023 15:16   US Abdomen Limited RUQ (LIVER/GB) Result Date: 02/27/2023 CLINICAL DATA:  Elevated LFTs EXAM: ULTRASOUND ABDOMEN LIMITED RIGHT UPPER QUADRANT COMPARISON:  None Available. FINDINGS: Gallbladder: Gallbladder  is contracted. Scattered small stones are identified. No other focal abnormality is seen. Common bile duct: Diameter: 6.6 mm. Liver: Liver is somewhat mildly nodular. No focal mass is seen. Portal vein is patent on color Doppler imaging with normal direction of blood flow towards the liver. Other: None. IMPRESSION: Chronic cholelithiasis without complicating factors. Mild nodularity of the liver. Electronically Signed   By: Alcide Clever M.D.   On: 02/27/2023 21:56   DG CHEST PORT 1 VIEW Result Date: 02/27/2023 CLINICAL DATA:  Central line placement. EXAM: PORTABLE CHEST 1 VIEW COMPARISON:  Chest radiograph dated 02/26/2023. FINDINGS: Right IJ central venous line with tip in the region of the cavoatrial junction. Bilateral confluent pulmonary opacities, right greater than left, new or progressed since the prior radiograph. No pleural effusion pneumothorax. Stable cardiac silhouette. Atherosclerotic calcification of the aorta. No acute osseous pathology. IMPRESSION: 1. Right IJ central venous line with tip in the region of the cavoatrial junction. No pneumothorax. 2. Pneumonia. Electronically Signed   By: Elgie Collard M.D.   On: 02/27/2023 18:08     Assessment/Plan: MRSA bacteremia/pneumonia = will d/c linezolid and switch to daptomycin(Bacteremia) plus iv doxycycline (to cover pulmonary infection). Blood cx 2/17 blood cx NGTD. Plan to treat as complicated bacteremia. Unlikely to be stable for TEE.  Once clinically stable would like line holiday/exchange  Newly diagnosed candidemia = suspect from gut translocation due to recent norovirus/gi bleed. Will continue on micafungin for minimum of 2 wk but will need to repeat blood on Friday to see he is clearing bacteremia. Will need to consider longer course if still positive and find other nidus of infection  Agree with dr Chestine Spore to consider discussing goals of care given constellation of recent illness and morbidity associated with these infections  Hx of  esbl uti = finished course of meropenem. Can stop.   Continue on contact isolation [facility] Pager: [phone]  03/01/2023, 2:57 PM

## 2023-03-01 NOTE — Progress Notes (Signed)
Pharmacy Antibiotic Note  [name] is a 68 y.o. male admitted on 02/26/2023 with bacteremia. Cultures from [facility] demonstrating MRSA in blood, now with candidemia as well. Pharmacy has been consulted for daptomycin dosing.  Plan: Daptomycin 700 mg (8 - 10 mg/kg rounded to nearest bag size) IV q1400  for bacteremia coverage Continue micafungin (candidemia), doxycycline (MRSA in respiratory culture) Discontinue linezolid, meropenem Monitor weekly CK on Thursdays Consider holding atorvastatin if prolonged course dapto for complicated bacteremia F/u blood culture clearance, line replacement, possible TEE  Weight: 98 kg (216 lb 0.8 oz)  Temp (24hrs), Avg:98.7 F (37.1 C), Min:98.6 F (37 C), Max:98.8 F (37.1 C)  Recent Labs  Lab 02/26/23 2219 02/26/23 2220 02/27/23 0320 02/27/23 0536 02/27/23 1812 02/27/23 2058 02/28/23 0449 02/28/23 0952 03/01/23 0350  WBC  --   --    < > 18.8* 24.5* 23.0* 23.5* 24.3* 32.8*  CREATININE 3.20*  --   --  2.53*  --  2.27* 2.14*  --  1.83*  LATICACIDVEN  --  3.8*  --  2.3*  --   --   --   --   --    < > = values in this interval not displayed.    Estimated Creatinine Clearance: 44.6 mL/min (A) (by C-G formula based on SCr of 1.83 mg/dL (H)).    Allergies  Allergen Reactions   Plavix [Clopidogrel] Other (See Comments)    Drowsiness Skin irritation   Antimicrobials this admission: Zosyn 2/16 > 2/18 Vanc 2/16 x1 (started on vanc 2/12) Linezolid 2/17 > 2/19 Merrem 2/18 > 2/19 Dapto 2/19 >> Doxy 2/19 >> Micafungin 2/19 >>  Microbiology results: 2/12 [facility] BCx: 1/2 staph aureus on prelim read  2/12 [facility] Ucx: ESBL E. Coli (S: cipro, ertapenem, meropenem, Zosyn, Macrobid; I: Levaquin) 2/12 [facility] sputum: MRSA, MDR E. Coli (S: cipro, ertapenem, meropenem, zosyn) 2/17 Bcx: NGTD 2/17 MRSA PCR: positive  Thank you for allowing pharmacy to be a part of this patient's care.  Rutherford Nail, PharmD PGY2 Critical Care Pharmacy  Resident 03/01/2023 3:08 PM

## 2023-03-02 ENCOUNTER — Inpatient Hospital Stay (HOSPITAL_COMMUNITY): Payer: No Typology Code available for payment source

## 2023-03-02 DIAGNOSIS — B49 Unspecified mycosis: Secondary | ICD-10-CM

## 2023-03-02 DIAGNOSIS — K922 Gastrointestinal hemorrhage, unspecified: Secondary | ICD-10-CM | POA: Diagnosis not present

## 2023-03-02 DIAGNOSIS — R579 Shock, unspecified: Secondary | ICD-10-CM | POA: Diagnosis not present

## 2023-03-02 DIAGNOSIS — R578 Other shock: Secondary | ICD-10-CM | POA: Diagnosis not present

## 2023-03-02 DIAGNOSIS — J15211 Pneumonia due to Methicillin susceptible Staphylococcus aureus: Secondary | ICD-10-CM | POA: Diagnosis not present

## 2023-03-02 DIAGNOSIS — Z66 Do not resuscitate: Secondary | ICD-10-CM | POA: Diagnosis not present

## 2023-03-02 DIAGNOSIS — N179 Acute kidney failure, unspecified: Secondary | ICD-10-CM

## 2023-03-02 DIAGNOSIS — A0811 Acute gastroenteropathy due to Norwalk agent: Secondary | ICD-10-CM

## 2023-03-02 DIAGNOSIS — Z7189 Other specified counseling: Secondary | ICD-10-CM | POA: Diagnosis not present

## 2023-03-02 DIAGNOSIS — Z515 Encounter for palliative care: Secondary | ICD-10-CM | POA: Diagnosis not present

## 2023-03-02 LAB — CBC WITH DIFFERENTIAL/PLATELET
Abs Immature Granulocytes: 0 10*3/uL (ref 0.00–0.07)
Basophils Absolute: 0 10*3/uL (ref 0.0–0.1)
Basophils Relative: 0 %
Eosinophils Absolute: 0 10*3/uL (ref 0.0–0.5)
Eosinophils Relative: 0 %
HCT: 22 % — ABNORMAL LOW (ref 39.0–52.0)
Hemoglobin: 6.6 g/dL — CL (ref 13.0–17.0)
Lymphocytes Relative: 0 %
Lymphs Abs: 0 10*3/uL — ABNORMAL LOW (ref 0.7–4.0)
MCH: 25.3 pg — ABNORMAL LOW (ref 26.0–34.0)
MCHC: 30 g/dL (ref 30.0–36.0)
MCV: 84.3 fL (ref 80.0–100.0)
Monocytes Absolute: 0 10*3/uL — ABNORMAL LOW (ref 0.1–1.0)
Monocytes Relative: 0 %
Neutro Abs: 18.8 10*3/uL — ABNORMAL HIGH (ref 1.7–7.7)
Neutrophils Relative %: 100 %
Platelets: 96 10*3/uL — ABNORMAL LOW (ref 150–400)
RBC: 2.61 MIL/uL — ABNORMAL LOW (ref 4.22–5.81)
RDW: 23.5 % — ABNORMAL HIGH (ref 11.5–15.5)
WBC: 18.8 10*3/uL — ABNORMAL HIGH (ref 4.0–10.5)
nRBC: 0.7 % — ABNORMAL HIGH (ref 0.0–0.2)
nRBC: 3 /100{WBCs} — ABNORMAL HIGH

## 2023-03-02 LAB — BASIC METABOLIC PANEL
Anion gap: 12 (ref 5–15)
BUN: 76 mg/dL — ABNORMAL HIGH (ref 8–23)
CO2: 17 mmol/L — ABNORMAL LOW (ref 22–32)
Calcium: 8 mg/dL — ABNORMAL LOW (ref 8.9–10.3)
Chloride: 109 mmol/L (ref 98–111)
Creatinine, Ser: 2.31 mg/dL — ABNORMAL HIGH (ref 0.61–1.24)
GFR, Estimated: 30 mL/min — ABNORMAL LOW (ref >60)
Glucose, Bld: 149 mg/dL — ABNORMAL HIGH (ref 70–99)
Potassium: 4.4 mmol/L (ref 3.5–5.1)
Sodium: 138 mmol/L (ref 135–145)

## 2023-03-02 LAB — POCT I-STAT 7, (LYTES, BLD GAS, ICA,H+H)
Acid-base deficit: 8 mmol/L — ABNORMAL HIGH (ref 0.0–2.0)
Acid-base deficit: 8 mmol/L — ABNORMAL HIGH (ref 0.0–2.0)
Bicarbonate: 17.5 mmol/L — ABNORMAL LOW (ref 20.0–28.0)
Bicarbonate: 18.2 mmol/L — ABNORMAL LOW (ref 20.0–28.0)
Calcium, Ion: 1.21 mmol/L (ref 1.15–1.40)
Calcium, Ion: 1.19 mmol/L (ref 1.15–1.40)
HCT: 20 % — ABNORMAL LOW (ref 39.0–52.0)
HCT: 22 % — ABNORMAL LOW (ref 39.0–52.0)
Hemoglobin: 6.8 g/dL — CL (ref 13.0–17.0)
Hemoglobin: 7.5 g/dL — ABNORMAL LOW (ref 13.0–17.0)
O2 Saturation: 86 %
O2 Saturation: 90 %
Patient temperature: 98.4
Patient temperature: 37.3
Potassium: 4.7 mmol/L (ref 3.5–5.1)
Potassium: 4.9 mmol/L (ref 3.5–5.1)
Sodium: 138 mmol/L (ref 135–145)
Sodium: 138 mmol/L (ref 135–145)
TCO2: 19 mmol/L — ABNORMAL LOW (ref 22–32)
TCO2: 19 mmol/L — ABNORMAL LOW (ref 22–32)
pCO2 arterial: 36.6 mm[Hg] (ref 32–48)
pCO2 arterial: 37.6 mm[Hg] (ref 32–48)
pH, Arterial: 7.286 — ABNORMAL LOW (ref 7.35–7.45)
pH, Arterial: 7.294 — ABNORMAL LOW (ref 7.35–7.45)
pO2, Arterial: 57 mm[Hg] — ABNORMAL LOW (ref 83–108)
pO2, Arterial: 65 mm[Hg] — ABNORMAL LOW (ref 83–108)

## 2023-03-02 LAB — TYPE AND SCREEN
ABO/RH(D): A POS
Antibody Screen: POSITIVE
Unit division: 0
Unit division: 0
Unit division: 0
Unit division: 0
Unit division: 0
Unit division: 0

## 2023-03-02 LAB — BPAM RBC
Blood Product Expiration Date: 202503092359
Blood Product Expiration Date: 202503092359
Blood Product Expiration Date: 202503112359
Blood Product Expiration Date: 202503122359
ISSUE DATE / TIME: 202502150627
ISSUE DATE / TIME: 202502171137
ISSUE DATE / TIME: 202502162240
ISSUE DATE / TIME: 202502162240
Unit Type and Rh: 6200
Unit Type and Rh: 6200
Unit Type and Rh: 5100
Unit Type and Rh: 5100

## 2023-03-02 LAB — CORTISOL: Cortisol, Plasma: 9.4 ug/dL

## 2023-03-02 LAB — PREPARE RBC (CROSSMATCH)

## 2023-03-02 LAB — CK: Total CK: 114 U/L (ref 49–397)

## 2023-03-02 LAB — GLUCOSE, CAPILLARY
Glucose-Capillary: 123 mg/dL — ABNORMAL HIGH (ref 70–99)
Glucose-Capillary: 128 mg/dL — ABNORMAL HIGH (ref 70–99)
Glucose-Capillary: 129 mg/dL — ABNORMAL HIGH (ref 70–99)
Glucose-Capillary: 131 mg/dL — ABNORMAL HIGH (ref 70–99)
Glucose-Capillary: 141 mg/dL — ABNORMAL HIGH (ref 70–99)
Glucose-Capillary: 160 mg/dL — ABNORMAL HIGH (ref 70–99)

## 2023-03-02 LAB — BRAIN NATRIURETIC PEPTIDE: B Natriuretic Peptide: 700.4 pg/mL — ABNORMAL HIGH (ref 0.0–100.0)

## 2023-03-02 LAB — PHOSPHORUS: Phosphorus: 5.2 mg/dL — ABNORMAL HIGH (ref 2.5–4.6)

## 2023-03-02 LAB — MAGNESIUM: Magnesium: 2.1 mg/dL (ref 1.7–2.4)

## 2023-03-02 MED ORDER — SODIUM CHLORIDE 0.9% IV SOLUTION: Freq: Once | INTRAVENOUS | Status: DC

## 2023-03-02 MED ORDER — PHENYLEPHRINE HCL 1 % NA SOLN: 2.0000 [drp] | Freq: Four times a day (QID) | NASAL | Status: DC | PRN

## 2023-03-02 MED ORDER — ALBUTEROL SULFATE (2.5 MG/3ML) 0.083% IN NEBU
2.5000 mg | INHALATION_SOLUTION | Freq: Four times a day (QID) | RESPIRATORY_TRACT | Status: DC
  Administered 2023-03-02: 2.5 mg via RESPIRATORY_TRACT
  Filled 2023-03-02: qty 3

## 2023-03-02 MED ORDER — LORAZEPAM 2 MG/ML IJ SOLN: 0.5000 mg | Freq: Four times a day (QID) | INTRAMUSCULAR | Status: DC | PRN

## 2023-03-02 MED ORDER — HYDROCORTISONE NICU INJ SYRINGE 50 MG/ML: 100.0000 mg | Freq: Three times a day (TID) | INTRAVENOUS | Status: DC

## 2023-03-02 MED ORDER — ORAL CARE MOUTH RINSE
15.0000 mL | OROMUCOSAL | Status: DC
  Administered 2023-03-02 (×4): 15 mL via OROMUCOSAL

## 2023-03-02 MED ORDER — OXYMETAZOLINE HCL 0.05 % NA SOLN
1.0000 | Freq: Two times a day (BID) | NASAL | Status: DC
  Administered 2023-03-02 (×2): 1 via NASAL
  Filled 2023-03-02 (×2): qty 30

## 2023-03-02 MED ORDER — VITAL AF 1.2 CAL PO LIQD
1000.0000 mL | ORAL | Status: DC
  Administered 2023-03-02: 1000 mL

## 2023-03-02 MED ORDER — OXYCODONE HCL 5 MG PO TABS: 5.0000 mg | ORAL_TABLET | Freq: Four times a day (QID) | ORAL | Status: DC | PRN

## 2023-03-02 MED ORDER — LINEZOLID 600 MG/300ML IV SOLN
600.0000 mg | Freq: Two times a day (BID) | INTRAVENOUS | Status: DC
  Administered 2023-03-02 (×2): 600 mg via INTRAVENOUS
  Filled 2023-03-02: qty 300

## 2023-03-02 MED ORDER — HYDROCORTISONE SOD SUC (PF) 100 MG IJ SOLR
100.0000 mg | Freq: Three times a day (TID) | INTRAMUSCULAR | Status: DC
  Administered 2023-03-02 – 2023-03-03 (×2): 100 mg via INTRAVENOUS
  Filled 2023-03-02 (×2): qty 2

## 2023-03-02 MED ORDER — ORAL CARE MOUTH RINSE: 15.0000 mL | OROMUCOSAL | Status: DC | PRN

## 2023-03-02 MED ORDER — GABAPENTIN 250 MG/5ML PO SOLN
300.0000 mg | Freq: Three times a day (TID) | ORAL | Status: DC
  Administered 2023-03-02 (×2): 300 mg
  Filled 2023-03-02 (×5): qty 6

## 2023-03-02 NOTE — Progress Notes (Signed)
Nutrition Follow-up  DOCUMENTATION CODES:   Non-severe (moderate) malnutrition in context of chronic illness  INTERVENTION:   TF via PEG: Increase Vital AF 1.2 by 10 ml every 4 hours to goal rate of 65 ml/h. Provides 1872 kcal, 117 gm protein, 1264 ml free water daily. Free water flushes 200 ml q6h plus free water from TF provides a total of 2064 ml free water daily.  NUTRITION DIAGNOSIS:   Moderate Malnutrition related to chronic illness as evidenced by moderate muscle depletion, mild muscle depletion, edema.  Ongoing   GOAL:   Patient will meet greater than or equal to 90% of their needs  Progressing with TF advancement today.  MONITOR:   TF tolerance, Vent status  REASON FOR ASSESSMENT:   Consult Enteral/tube feeding initiation and management, Assessment of nutrition requirement/status  ASSESSMENT:   68 yo male admitted with shock with sepsis and acute GIB, AKI on CKD 3. Pt with recent discharge from [facility] on 02/14/23. PMH includes CVA with resultant dysphagia, aphasia and bed bound status with PEG in place, advanced dementia, HTN, HLD, DM, CKD 2, GERD  Blood culture positive for fungemia 2/19.  Patient with increasing oxygen requirement. Placed on HHFNC 60L/90% by RRT this morning.  Pink, frothy sputum with concern for diffuse alveolar hemorrhage noted.  Currently receiving Vital AF 1.2 at 20 ml/h via PEG with free water flushes 200 ml q6h. Per discussion with MD, okay to increase tube feeding rate today.  Labs reviewed. Phos 5.2 CBG: [phone]  Medications reviewed and include folic acid, novolog, sodium bicarb, levophed, micafungin, IV antibiotics.  Admit weight 93.8 kg Current weight 100.6 kg I/O + 1.7 L since admission  Diet Order:   Diet Order             Diet NPO time specified  Diet effective now                   EDUCATION NEEDS:   Not appropriate for education at this time  Skin:  Skin Assessment: Skin Integrity Issues: Skin  Integrity Issues:: Stage II Stage II: sacrum  Last BM:  2/19 rectal tube  Height:   Ht Readings from Last 1 Encounters:  01/28/23 5\' 9"  (1.753 m)    Weight:   Wt Readings from Last 1 Encounters:  03/02/23 100.6 kg    BMI:  Body mass index is 32.75 kg/m.  Estimated Nutritional Needs:   Kcal:  1800-2000 kcals  Protein:  110-130 g  Fluid:  >/= 2L   Gabriel Rainwater RD, LDN, CNSC Contact via secure chat. If unavailable, use group chat "RD Inpatient."

## 2023-03-02 NOTE — Progress Notes (Signed)
Pt placed on HHFNC 60L/90% and is tolerating well. RT will monitor.

## 2023-03-02 NOTE — Progress Notes (Signed)
Daily Progress Note   Patient Name: [name]       Date: 03/02/2023 DOB: [birth year]  Age: 68 y.o. MRN#: [id number] Attending Physician: Marcelle Smiling, MD Primary Care Physician: Benetta Spar, MD Admit Date: 02/26/2023 Length of Stay: 3 days  Reason for Consultation/Follow-up: Establishing goals of care  HPI/Patient Profile:  68 y.o. male  with past medical history of CVA with resultant aphasia, dysphagia, and bedbound status as well as advanced dementia, hypertension, hyperlipidemia, type 2 diabetes with hyperglycemia, CKD 2, GERD.  He presented from [facility] where he was admitted after previous hospitalization actively being treated for sepsis.  He presented with episode of hypotension and large melanotic stool earlier.  He was admitted on 02/26/2023 with shock (secondary to GI bleed versus sepsis, favored to be sepsis), acute GI bleed with blood loss anemia, hemorrhagic shock with lactic acidosis, MSSA pneumonia, metabolic acidosis, chronic HFrEF, and others.    Palliative medicine was consulted for GOC conversations.  Palliative medicine previously saw the patient during last admission in January at [facility].  Goals at that time were to allow a trial of 6 to 8 weeks of long-term acute care to see if there could be meaningful improvement/recovery.  Since admission patient has had increasing oxygen needs with worsening CXR. Now also with Norovirus, candida fungemia, MRSA bacteremia/pna.  Subjective:   Subjective: Chart Reviewed. Updates received. Patient Assessed. Created space and opportunity for patient  and family to explore thoughts and feelings regarding current medical situation.  Today's Discussion: Today saw the patient at the bedside, he was unable to meaningfully communicate.  He does have a [employer] in place.  I spoke with the nursing staff who states that the patient has a lot of secretions which was limiting their ability to consider BiPAP.  They  have been doing suctioning and PCCM physician did suctioning this morning which resulted in a nosebleed and required a Rhino Rocket device to stop the bleeding.  Reviewed the chart extensively and noted increasing O2 requirement 15 L this morning now on 60 L / 90% heated high flow nasal cannula.  He has MRSA bacteremia/pneumonia with chest x-ray this morning showing slight worsening, which explains the increased oxygen needs.  His E. coli UTI is treated/resolved.  He now has norovirus enteritis and Candida glabrata fungemia.  Infectious diseases treating.  CBC yesterday shows increasing white blood cell count and stable hemoglobin at 7.9, no noted ongoing bleeding in the past 24 hours.  CBC for today's pending.  After seeing the patient I reached out to his daughter.  We had an extensive discussion with her updated her about current situation.  I answered multiple questions.  I shared that she can call the nursing unit for updates or our team.  I confirmed her goals to continue full scope of care, other than DNR/DNI, to try to overcome these infections.  Goals still for the patient get back to [facility] for therapy and try to obtain functional improvement.  I made it clear that he is very sick and there is a chance he may not survive if we cannot get him better despite aggressive medical treatment.  She seems to understand this.  I shared that palliative medicine would allow some time for more outcomes and likely follow-up in 2 to 3 days. I provided emotional and general support through therapeutic listening, empathy, sharing of stories, and other techniques. I answered all questions and addressed all concerns to the best of my ability.  Review of Systems  Unable to perform ROS   Objective:   Vital Signs:  BP 108/71   Pulse 98   Temp 98.2 F (36.8 C)   Resp 11   Wt 100.6 kg   SpO2 94%   BMI 32.75 kg/m   Physical Exam Vitals and nursing note reviewed.  Constitutional:      General: He is  sleeping. He is not in acute distress. HENT:     Head: Normocephalic and atraumatic.     Nose:     Left Nostril: Epistaxis (Rhino Rocket device in place) present.  Cardiovascular:     Rate and Rhythm: Normal rate.  Pulmonary:     Effort: Pulmonary effort is normal. No respiratory distress.     Breath sounds: Normal breath sounds.  Abdominal:     General: Abdomen is flat.     Palpations: Abdomen is soft.  Skin:    General: Skin is warm and dry.     Palliative Assessment/Data: 10%    Existing Vynca/ACP Documentation: Goals of care document signed 02/01/2023  Assessment & Plan:   Impression: Present on Admission:  Hemorrhagic shock (HCC)  Gastrointestinal hemorrhage with melena  Protein calorie malnutrition (HCC)  Acute metabolic encephalopathy  Malnutrition of moderate degree  68 year old male with acute presentation chronic comorbidities as described above. The patient was at [facility] long-term acute care for ongoing management status post CVA and pneumonia. He presented with likely septic shock, acute GI bleed felt by GI to be likely due to hypotension/ischemic bowel. He is currently quite sick, unable to meaningfully communicate. I established contact with the patient's daughter and discussed goals described above. At this point we will give some time for outcomes over the next couple days to see how he does with his sepsis picture. We will continue to follow for ongoing GOC discussions pending evolution of his clinical picture. Overall prognosis poor.   SUMMARY OF RECOMMENDATIONS   DNR-limited Continue full scope with medical care Goal is to try to overcome infections and get back to [facility] for rehab and hopeful long-term functional improvement Allow time for outcomes Palliative medicine will follow-up in a couple days Please notify us of any significant clinical change or new palliative needs in the interim  Symptom Management:  Per primary team PMT is available to  assist as needed  Code Status: DNR-limited  Prognosis: Unable to determine  Discharge Planning: To Be Determined  Discussed with: Patient's family, medical team, nursing team  Thank you for allowing [place] to participate in the care of [name] PMT will continue to support holistically.  Time Total: 40 min  Detailed review of medical records (labs, imaging, vital signs), medically appropriate exam, discussed with treatment team, counseling and education to patient, family, & staff, documenting clinical information, medication management, coordination of care  Wynne Dust, NP Palliative Medicine Team  Team Phone # [phone] (Nights/Weekends)  09/08/2020, 8:17 AM

## 2023-03-02 NOTE — Progress Notes (Signed)
PCCM Overnight  Called to bedside for epistaxis of left nare. Had rhino rocket earlier which was discontinued and since then, has had ongoing epistaxis.  RN mentions that 4x4 gauze packing was changed around 1930 with tip of gauze squirted with Afrin. On exam, it has blood tinged drainage on it but no frank blood. On removal, some mild frank blood noted.  I applied a rolled up 4x4 gauze saturated in Afrin and packed the nare as tightly as I could without applying excessive pressure. No bleeding noted for several minutes afterwards. RN to repack through the night if needed but [facility] also aware that if has ongoing mod to significant frank bleeding, will need to call ENT for new rhino rocket placement.   Additionally, RN concerned about mental status. He is quite altered and only responds to noxious stimuli minimally. RN states last night he was able to be aroused intermittently and would open eyes but not follow commands. Today's AM progress notes by CCM states the same. ID progress note mentions him being disoriented.  ABG obtained at 2018 with hypoxia but no hypercapnia (7.3/38/65). CBG from 1946 normal at 128. Given concern for acute change, will order CT head to ensure no intracranial process at play. [facility] aware to follow for results and notify neuro if needed.   Rutherford Guys, PA - [facility] For pager details, please see AMION or use Epic chat  After 1900, please call [facility] for cross coverage needs 03/02/2023, 8:54 PM

## 2023-03-02 NOTE — Plan of Care (Signed)
  Problem: Nutritional: Goal: Maintenance of adequate nutrition will improve Outcome: Progressing   Problem: Fluid Volume: Goal: Ability to maintain a balanced intake and output will improve Outcome: Not Progressing   Problem: Health Behavior/Discharge Planning: Goal: Ability to manage health-related needs will improve Outcome: Not Progressing

## 2023-03-02 NOTE — Progress Notes (Signed)
[facility]  Date of Admission:  02/26/2023      Total days of antibiotics 9  Linezolid 2/20 > c   Meropenem 2/12 - 2/19 (missed 2/17 dose)  Vancomycin PTA  Zosyn x 1 PTA   Levaquin PTA           ASSESSMENT: [name] is a 68 y.o. male   Candidemia -  Candida glabrata from admission blood cultures  Central line needs to be removed at some point when he can hemodynamically afford that.  Not a good candidate for TEE - TTE was unrevealing but unable to see AV well  -Repeat blood cultures sent -Continue micafungin while azole MIC is run  Staph Aureus Bacteremia (OSH) -  E Coli UTI, ESBL (OSH) -  Pneumonia with Dense B/L Consolidation - 2/12 MRSA in Sputum  2/11 blood cultures 1/2 staph aureus (was on vancomycin at [facility] since 2/12 PTA) Overnight decompensated and requiring more oxygen now. Florid rhonchi with some difficulty coughing up secretions. Undergoing chest PT from 15 - 60 lpm on HHFNC. D/W Dr. Ardeth Perfect and will transition back to IV linezolid for treatment and follow platelets closely vs Dapto/Doxy combo.  Not considering bronch at this point d/t high risk for intubation and VDRF.  Not a good candidate for TEE - TTE was unrevealing but unable to see AV well  Would really try to stay away from vancomycin given UOP is poor and AKI on admission  -change to linezolid to cover lungs. He already cleared MRSA bacteremia on admission.   ESBL E Coli Pneumonia / UTI -  He got Meropenem at [facility] from 2/12 - 2/16, off x 1d, resumed on 2/18-2/19, so ~8 days with a 1 day gap. This should be adequate for UTI and pneumonia treatment I suspect his lungs are all 2/2 MRSA pneumonia.   Hypotension with Acute GI Hemorrhage -  Norovirus ( + )   Being transfused - CBC q6h per PCCM / GI team. Now S/P 3 units pRBC. Suspect that this was triggered by infectious enteritis. Stable H/H - treating medically for now w/o scope. GI signed off.   -Enteric precautions   Norovirus ( + )   Enteric precautions and supportive care to avoid dehydration   Transaminitis -  Seemed to have more reaction to pain with RUQ tenderness. Likely in setting of GI illness.  Repeat scheduled in AM.  Hypotension -  No pressors. Lactate cleared. Concern for adrenal insufficiency given prolonged taper of steroids for meningioma care -cortisol pending  Isolation Recommendations -  Contact / enteric    D/W Dr. Drue Second and Dr. Ardeth Perfect   PLAN: Change antimicrobials to linezolid + micafungin Follow plt daily  Would suspect Ecoli in sputum and urine was adequately treated  Could consider foley cath exchange but I suspect he is colonized with ESBL Continue contact/enteric precautions    Principal Problem:   Hemorrhagic shock (HCC) Active Problems:   Protein calorie malnutrition (HCC)   Acute metabolic encephalopathy   Malnutrition of moderate degree   Gastrointestinal hemorrhage with melena   Fungemia   Norovirus   AKI (acute kidney injury) (HCC)   Septic shock (HCC)    aspirin  81 mg Per Tube Daily   atorvastatin  40 mg Per Tube Daily   Chlorhexidine Gluconate Cloth  6 each Topical Daily   diclofenac Sodium  4 g Topical QID   folic acid  1 mg Per  Tube Daily   free water  200 mL Per Tube Q6H   gabapentin  300 mg Per Tube Q8H   insulin aspart  1-3 Units Subcutaneous Q4H   lidocaine  1 patch Transdermal Q24H   mupirocin ointment  1 Application Nasal BID   mouth rinse  15 mL Mouth Rinse 4 times per day   oxymetazoline  1 spray Each Nare BID   pantoprazole (PROTONIX) IV  40 mg Intravenous Q12H   QUEtiapine  12.5 mg Per Tube QHS   sodium bicarbonate  650 mg Per Tube TID   sodium chloride HYPERTONIC  4 mL Nebulization Q4H    SUBJECTIVE: Screams out when touched and randomly.   Review of Systems: ROS  Allergies  Allergen Reactions   Plavix [Clopidogrel] Other (See Comments)    Drowsiness Skin irritation    OBJECTIVE: Vitals:    03/02/23 1515 03/02/23 1530 03/02/23 1545 03/02/23 1546  BP: 95/68 103/73  108/71  Pulse: 93 (!) 102 99 98  Resp: (!) 8 10 11 11   Temp: 98.4 F (36.9 C) 98.4 F (36.9 C) 98.2 F (36.8 C) 98.2 F (36.8 C)  TempSrc:      SpO2: (!) 89% (!) 85% 94% 94%  Weight:       Body mass index is 32.75 kg/m.  Physical Exam Constitutional:      Appearance: He is ill-appearing.  HENT:     Mouth/Throat:     Mouth: Mucous membranes are dry.     Pharynx: No oropharyngeal exudate.  Cardiovascular:     Rate and Rhythm: Normal rate and regular rhythm.  Pulmonary:     Effort: No respiratory distress.     Breath sounds: Rhonchi present.  Abdominal:     General: There is no distension.  Neurological:     Mental Status: He is disoriented.     Lab Results Lab Results  Component Value Date   WBC 32.8 (H) 03/01/2023   HGB 7.9 (L) 03/01/2023   HCT 25.2 (L) 03/01/2023   MCV 81.6 03/01/2023   PLT 108 (L) 03/01/2023    Lab Results  Component Value Date   CREATININE 2.31 (H) 03/02/2023   BUN 76 (H) 03/02/2023   NA 138 03/02/2023   K 4.4 03/02/2023   CL 109 03/02/2023   CO2 17 (L) 03/02/2023    Lab Results  Component Value Date   ALT 68 (H) 02/28/2023   AST 62 (H) 02/28/2023   GGT 275 (H) 02/26/2023   ALKPHOS 245 (H) 02/28/2023   BILITOT 1.4 (H) 02/28/2023     Microbiology: Recent Results (from the past 240 hours)  Blood Culture (routine x 2)     Status: None (Preliminary result)   Collection Time: 02/26/23 10:05 PM   Specimen: BLOOD  Result Value Ref Range Status   Specimen Description BLOOD RIGHT ANTECUBITAL  Final   Special Requests   Final    BOTTLES DRAWN AEROBIC AND ANAEROBIC Blood Culture adequate volume   Culture  Setup Time   Final    BUDDING YEAST SEEN AEROBIC BOTTLE ONLY CRITICAL VALUE NOTED.  VALUE IS CONSISTENT WITH PREVIOUSLY REPORTED AND CALLED VALUE. Performed at [facility], [address], [place], [place] [address]    Culture YEAST  Final    Report Status PENDING  Incomplete  Resp panel by RT-PCR (RSV, Flu A&B, Covid)     Status: None   Collection Time: 02/26/23 10:07 PM   Specimen: Nasal Swab  Result Value Ref Range Status  SARS Coronavirus 2 by RT PCR NEGATIVE NEGATIVE Final   Influenza A by PCR NEGATIVE NEGATIVE Final   Influenza B by PCR NEGATIVE NEGATIVE Final    Comment: (NOTE) The Xpert Xpress SARS-CoV-2/FLU/RSV plus assay is intended as an aid in the diagnosis of influenza from Nasopharyngeal swab specimens and should not be used as a sole basis for treatment. Nasal washings and aspirates are unacceptable for Xpert Xpress SARS-CoV-2/FLU/RSV testing.  Fact Sheet for Patients: BloggerCourse.com  Fact Sheet for Healthcare Providers: SeriousBroker.it  This test is not yet approved or cleared by the [place] FDA and has been authorized for detection and/or diagnosis of SARS-CoV-2 by FDA under an Emergency Use Authorization (EUA). This EUA will remain in effect (meaning this test can be used) for the duration of the COVID-19 declaration under Section 564(b)(1) of the Act, 21 U.S.C. section 360bbb-3(b)(1), unless the authorization is terminated or revoked.     Resp Syncytial Virus by PCR NEGATIVE NEGATIVE Final    Comment: (NOTE) Fact Sheet for Patients: BloggerCourse.com  Fact Sheet for Healthcare Providers: SeriousBroker.it  This test is not yet approved or cleared by the [place] FDA and has been authorized for detection and/or diagnosis of SARS-CoV-2 by FDA under an Emergency Use Authorization (EUA). This EUA will remain in effect (meaning this test can be used) for the duration of the COVID-19 declaration under Section 564(b)(1) of the Act, 21 U.S.C. section 360bbb-3(b)(1), unless the authorization is terminated or revoked.  Performed at [facility], [address], [place],  [place] [address]   Blood Culture (routine x 2)     Status: Abnormal (Preliminary result)   Collection Time: 02/26/23 10:10 PM   Specimen: BLOOD  Result Value Ref Range Status   Specimen Description BLOOD LEFT ANTECUBITAL  Final   Special Requests   Final    BOTTLES DRAWN AEROBIC AND ANAEROBIC Blood Culture results may not be optimal due to an inadequate volume of blood received in culture bottles   Culture  Setup Time   Final    BUDDING YEAST SEEN IN BOTH AEROBIC AND ANAEROBIC BOTTLES CRITICAL RESULT CALLED TO, READ BACK BY AND VERIFIED WITH: PHARMDC. AMEND [id number] @1956  FH    Culture (A)  Final    CANDIDA GLABRATA Sent to [facility] for further susceptibility testing. Performed at [facility], [address], [place], [place] [address]    Report Status PENDING  Incomplete  Blood Culture ID Panel (Reflexed)     Status: Abnormal   Collection Time: 02/26/23 10:10 PM  Result Value Ref Range Status   Enterococcus faecalis NOT DETECTED NOT DETECTED Final   Enterococcus Faecium NOT DETECTED NOT DETECTED Final   Listeria monocytogenes NOT DETECTED NOT DETECTED Final   Staphylococcus species NOT DETECTED NOT DETECTED Final   Staphylococcus aureus (BCID) NOT DETECTED NOT DETECTED Final   Staphylococcus epidermidis NOT DETECTED NOT DETECTED Final   Staphylococcus lugdunensis NOT DETECTED NOT DETECTED Final   Streptococcus species NOT DETECTED NOT DETECTED Final   Streptococcus agalactiae NOT DETECTED NOT DETECTED Final   Streptococcus pneumoniae NOT DETECTED NOT DETECTED Final   Streptococcus pyogenes NOT DETECTED NOT DETECTED Final   A.calcoaceticus-baumannii NOT DETECTED NOT DETECTED Final   Bacteroides fragilis NOT DETECTED NOT DETECTED Final   Enterobacterales NOT DETECTED NOT DETECTED Final   Enterobacter cloacae complex NOT DETECTED NOT DETECTED Final   Escherichia coli NOT DETECTED NOT DETECTED Final   Klebsiella aerogenes NOT DETECTED NOT DETECTED Final   Klebsiella oxytoca NOT  DETECTED NOT DETECTED Final   Klebsiella pneumoniae NOT DETECTED NOT DETECTED Final   Proteus species NOT DETECTED NOT DETECTED Final   Salmonella species NOT DETECTED NOT DETECTED Final   Serratia marcescens NOT DETECTED NOT DETECTED Final   Haemophilus influenzae NOT DETECTED NOT DETECTED Final   Neisseria meningitidis NOT DETECTED NOT DETECTED Final   Pseudomonas aeruginosa NOT DETECTED NOT DETECTED Final   Stenotrophomonas maltophilia NOT DETECTED NOT DETECTED Final   Candida albicans NOT DETECTED NOT DETECTED Final   Candida auris NOT DETECTED NOT DETECTED Final   Candida glabrata DETECTED (A) NOT DETECTED Final    Comment: CRITICAL RESULT CALLED TO, READ BACK BY AND VERIFIED WITH: PHARMDC. AMEND [phone] @1956  FH    Candida krusei NOT DETECTED NOT DETECTED Final   Candida parapsilosis NOT DETECTED NOT DETECTED Final   Candida tropicalis NOT DETECTED NOT DETECTED Final   Cryptococcus neoformans/gattii NOT DETECTED NOT DETECTED Final    Comment: Performed at [facility], [address], [place], [place] [address]  MRSA Next Gen by PCR, Nasal     Status: Abnormal   Collection Time: 02/27/23  2:47 AM   Specimen: Nasal Mucosa; Nasal Swab  Result Value Ref Range Status   MRSA by PCR Next Gen DETECTED (A) NOT DETECTED Final    Comment: RESULT CALLED TO, READ BACK BY AND VERIFIED WITH: R SARANE,RN@0424  02/27/23 MK (NOTE) The GeneXpert MRSA Assay (FDA approved for NASAL specimens only), is one component of a comprehensive MRSA colonization surveillance program. It is not intended to diagnose MRSA infection nor to guide or monitor treatment for MRSA infections. Test performance is not FDA approved in patients less than 57 years old. Performed at [facility], [address] [place], [place] [address]   Gastrointestinal Panel by PCR , Stool     Status: Abnormal   Collection Time: 02/27/23  4:29 PM   Specimen: Stool  Result Value Ref Range Status   Campylobacter species  NOT DETECTED NOT DETECTED Final   Plesimonas shigelloides NOT DETECTED NOT DETECTED Final   Salmonella species NOT DETECTED NOT DETECTED Final   Yersinia enterocolitica NOT DETECTED NOT DETECTED Final   Vibrio species NOT DETECTED NOT DETECTED Final   Vibrio cholerae NOT DETECTED NOT DETECTED Final   Enteroaggregative E coli (EAEC) NOT DETECTED NOT DETECTED Final   Enteropathogenic E coli (EPEC) NOT DETECTED NOT DETECTED Final   Enterotoxigenic E coli (ETEC) NOT DETECTED NOT DETECTED Final   Shiga like toxin producing E coli (STEC) NOT DETECTED NOT DETECTED Final   Shigella/Enteroinvasive E coli (EIEC) NOT DETECTED NOT DETECTED Final   Cryptosporidium NOT DETECTED NOT DETECTED Final   Cyclospora cayetanensis NOT DETECTED NOT DETECTED Final   Entamoeba histolytica NOT DETECTED NOT DETECTED Final   Giardia lamblia NOT DETECTED NOT DETECTED Final   Adenovirus F40/41 NOT DETECTED NOT DETECTED Final   Astrovirus NOT DETECTED NOT DETECTED Final   Norovirus GI/GII DETECTED (A) NOT DETECTED Final    Comment: RESULT CALLED TO, READ BACK BY AND VERIFIED WITH: Cathe Mons RN [id number] 02/28/23 HNM    Rotavirus A NOT DETECTED NOT DETECTED Final   Sapovirus (I, II, IV, and V) NOT DETECTED NOT DETECTED Final    Comment: Performed at [facility], [address], [place], [place] [address]  C Difficile Quick Screen w PCR reflex     Status: None   Collection Time: 02/27/23  4:29 PM   Specimen: STOOL  Result Value Ref Range Status  C Diff antigen NEGATIVE NEGATIVE Final   C Diff toxin NEGATIVE NEGATIVE Final   C Diff interpretation No C. difficile detected.  Final    Comment: Performed at [facility], [address] [place], [place] [address]  Expectorated Sputum Assessment w Gram Stain, Rflx to Resp Cult     Status: None   Collection Time: 02/28/23  9:20 AM   Specimen: Sputum  Result Value Ref Range Status   Specimen Description SPUTUM  Final   Special Requests NONE  Final    Sputum evaluation   Final    Sputum specimen not acceptable for testing.  Please recollect.   Gram Stain Report Called to,Read Back By and Verified With: RN Abran Duke [phone] @ [phone] FH Performed at [facility], [address] [place], [place] [address]    Report Status 03/01/2023 FINAL  Final  Culture, blood (Routine X 2) w Reflex to ID Panel     Status: None (Preliminary result)   Collection Time: 03/01/23  9:01 PM   Specimen: BLOOD  Result Value Ref Range Status   Specimen Description BLOOD SITE NOT SPECIFIED  Final   Special Requests   Final    BOTTLES DRAWN AEROBIC AND ANAEROBIC Blood Culture results may not be optimal due to an inadequate volume of blood received in culture bottles   Culture   Final    NO GROWTH < 12 HOURS Performed at [facility], [address] [place], [place] [address]    Report Status PENDING  Incomplete  Culture, blood (Routine X 2) w Reflex to ID Panel     Status: None (Preliminary result)   Collection Time: 03/01/23  9:04 PM   Specimen: BLOOD  Result Value Ref Range Status   Specimen Description BLOOD SITE NOT SPECIFIED  Final   Special Requests   Final    BOTTLES DRAWN AEROBIC AND ANAEROBIC Blood Culture results may not be optimal due to an inadequate volume of blood received in culture bottles   Culture   Final    NO GROWTH < 12 HOURS Performed at [facility], [address], [place], [place] [address]    Report Status PENDING  Incomplete     Rexene Alberts, MSN, NP-C [facility]  [email or web address] Pager: [phone] Office: [phone] [facility] Main Line: [phone] *Secure Chat Communication Welcome

## 2023-03-02 NOTE — Progress Notes (Signed)
NAME:  EULA MAZZOLA, MRN:  [id number], DOB:  June 20, 1955, LOS: 3 ADMISSION DATE:  02/26/2023, CONSULTATION DATE:  2331 REFERRING MD:  EDP, CHIEF COMPLAINT:  acute GIB   History of Present Illness:  [name] presented after being actively treated for sepsis (MSSA/Ecoli and bacteremia, diagnosed and treated at [facility]) , recent discharged from [facility] on 02/14/23. He had an episode of hypotension after a large melanotic stool earlier today. Per report from EDP, as pt is aphasic 2/2 CVA, [facility] (his [facility]) reported that pt was improving from a septic standpoint until today. Pt is on no chronic a/c but is on ASA per his peg tube. His facility reportedly started volume resuscitation while awaiting transport to ED. Pt had mild improvement in BP but has ultimately required initiation of vasopressors via piv. His hgb was found to be 6.1. EDP ordered 2 U PRBC at this time and is contacting GI for consultation.   Recent PEG placement 02/02/23. Recent c-scope (inadequate prep) in 11/2022. Had multiple poolyps removed, as large as 25mm.   All history is obtained from chart review as pt is unable to provide any 2/2 baseline status.   Ccm was asked to admit 2/2 need for vasopressors at this time.   Pertinent  Medical History  H/o cva with resultant aphasia, dysphagia and bedbound status Advanced dementia Htn Hyperlipidemia T2dm with hyperglycemia Ckd2 gerd  Significant Hospital Events: Including procedures, antibiotic start and stop dates in addition to other pertinent events   2/16 Admitted to ICU. 2 units pRBC given at admission.  2/17 1 unit pRBC, CVC placed 2/18 More lethargic this morning. Less rectal output, still bloody.  2/19 no acute events overnight, remains on low-dose Levophed and mildly hypoxic this a.m. 2/20: no acute events overnight.  Increasing oxygen requirement.  Now, on 15 LPM + 100% NRB.  Interim History / Subjective:  Hgb 7.9 this AM, stable.  On doxycycline/daptomycin for  MRSA bacteremia and pneumonia.  Meropenem for ESBL-E coli UTI stopped.  On 15 LPM + 100% NRB.  Bloody secretions emanating from suctioning of airway.  Did have epistaxis which has resolved but secretions continue to appear pink and frothy..  Objective   Blood pressure (!) 87/59, pulse 83, temperature 98.1 F (36.7 C), temperature source Bladder, resp. rate (!) 7, weight 100.6 kg, SpO2 90%. CVP:  [1 mmHg-26 mmHg] 21 mmHg      Intake/Output Summary (Last 24 hours) at 03/02/2023 0849 Last data filed at 03/02/2023 0800 Gross per 24 hour  Intake 1959.82 ml  Output 741 ml  Net 1218.82 ml   Filed Weights   02/28/23 0510 03/01/23 0500 03/02/23 0500  Weight: 93.8 kg 98 kg 100.6 kg    Examination: General: Acute on chronic ill-appearing deconditioned elderly male lying in bed in mild discomfort but no acute distress HEENT: [place]/AT, MM pink/moist, PERRL,  Neuro: Opens eyes to verbal stimuli, tracks movements, does not follow commands CV: s1s2 regular rate and rhythm, no murmur, rubs, or gallops,  PULM: Rhonchi, thick pink frothy secretions on 6 L nasal cannula, no increased work of breathing GI: soft, bowel sounds active in all 4 quadrants, non-tender, non-distended Extremities: warm/dry, no edema  Skin: no rashes or lesions  Resolved Hospital Problem list   Hyperkalemia  Assessment & Plan:  Shock, septic +/- hemorrhagic -Hemoglobin on admission 5.4, s/p 3 units PRBC.  -Known *MRSA* pneumonia and ESBL-E coli in urine and sputum cultures from [facility].  Reportedly finished treatment course for ESBL-E coli  Continue pressors for MAP goal greater than 65 Strict I/O. Trend lactic acid. The patient was noted to be on Decadron as a home medication. Check random cortisol.  Check prior to administration of steroids.  MRSA pneumonia, POA -Dense bibasilar consolidations seen on CTA abdomen and pelvis -MRSA PCR positive on admission E coli UTI, subacute, POA -Present on admission. -Bacteriuria  with pyuria.  ESBL E coli isolated from cultures at [facility]. -Meropenem stopped 2/18 per ID  Candida glabrata fungemia  At risk for intubation given progressive hypoxia, currently requiring 6 L nasal cannula with bilateral rhonchi Repeat chest x-ray  Continue doxycycline/daptomycin.  Micafungin started 2/18 Follow sputum culture once obtained Encourage pulmonary hygiene, as able NTS suctioning as needed Gentle diuresing today  Pink, frothy sputum Check BNP Consider diffuse alveolar hemorrhage. Given that the patient was chronically on steroids at home, risk-benefit may favor going ahead and restarting steroid therapy as this patient is highly likely to require endotracheal intubation for the procedure.  This carries a risk of a prolonged period of mechanical ventilatory support.  Acute GIB likely secondary to infectious enteritis Positive norovirus, POA Acute blood loss anemia on chronic anemia, s/p 3 units pRBC GI input appreciated: no plans for inpatient endoscopies. Trend CBC Transfuse per protocol Hemoglobin goal: > 7 Supportive care, monitor intake and output Continue home aspirin as below Twice daily PPI If renal function continues to improve can consider CT  AKI on CKD2  Metabolic acidosis with lactic acidosis; slowly improving P: Strict taken output Avoid nephrotoxins Monitor urine output Continue enteral bicarb  DM2 with Hyperglycemia -A1c 7.2 P: SSI CBG goal 140-180 CBG checks every 4  Chronic HFrEF -EF 35-40%> improved from last month 25-30%.  P: Continuous telemetry  Aspirin as above Strict intake and output Daily weight Diurese as able, appears intravascularly dry today Closely monitor renal function and electrolytes Supplemental oxygen support as above Resume home GDMT as able Continuous telemetry  Elevated transaminases Elevated alk phos Hyperbilirubinemia due to acute GIB vs sepsis - RUQ reassuring for no acute biliary  disease P: Intermittently trend LFTs Avoid hepatotoxins  Thrombocytopenia due to sepsis P: Trend CBC Monitor for signs of bleeding Transfuse for platelets greater than 10  Moderate protein energy malnutrition History of TF intolerance requiring reglan Dysphagia -last seen by SLP 02/10/23, recommended NPO P: Continue tube feeds, advance rate as able  Intractable pain -Still screams when he wakes up P: As needed oxycodone and Dilaudid  Decubitus ulcer sacrum; stage 2, POA Skin tear mid-penis -wound care frequent turns P: Optimize nutrition Pressure living devices Frequent PERI care  H/o CVA with chronic deficits, PEG dependent P: Neuroprotective measures Minimize sedation Delirium precautions Aspiration precautions Continue home aspirin  Best Practice (right click and "Reselect all SmartList Selections" daily)   Diet/type: tubefeeds DVT prophylaxis SCD Pressure ulcer(s): present on admission  see RN assessment GI prophylaxis: PPI Lines: Central line Foley:  Yes, and it is still needed Code Status:  DNR but ok with intubation, pressors etc Last date of multidisciplinary goals of care discussion [2/18 with daughter [name]]  Critical care time:   Critical care time: 30 minutes.  The treatment and management of the patient's condition was required based on the threat of imminent deterioration. This time reflects time spent by the physician evaluating, providing care and managing the critically ill patient's care. The time was spent at the immediate bedside (or on the same floor/unit and dedicated to this patient's care). Time involved in separately billable procedures is  NOT included int he critical care time indicated above. Family meeting and update time may be included above if and only if the patient is unable/incompetent to participate in clinical interview and/or decision making, and the discussion was necessary to determining treatment decisions.  Marcelle Smiling,  MD Board Certified by the ABIM, Pulmonary Diseases & Critical Care Medicine  03/02/2023, 9:29 AM

## 2023-03-03 LAB — BPAM RBC
Blood Product Expiration Date: 202503152359
Blood Product Expiration Date: 202503172359
ISSUE DATE / TIME: 202502141457
Unit Type and Rh: 6200
Unit Type and Rh: 6200

## 2023-03-03 LAB — PATHOLOGIST SMEAR REVIEW

## 2023-03-03 LAB — TYPE AND SCREEN
ABO/RH(D): A POS
Antibody Screen: NEGATIVE
Unit division: 0
Unit division: 0

## 2023-03-03 LAB — CULTURE, BLOOD (ROUTINE X 2)
Culture  Setup Time: NO GROWTH
Special Requests: ADEQUATE

## 2023-03-03 MED ORDER — HALOPERIDOL LACTATE 5 MG/ML IJ SOLN: 0.5000 mg | INTRAMUSCULAR | Status: DC | PRN

## 2023-03-03 MED ORDER — ONDANSETRON HCL 4 MG/2ML IJ SOLN: 4.0000 mg | Freq: Four times a day (QID) | INTRAMUSCULAR | Status: DC | PRN

## 2023-03-03 MED ORDER — POLYVINYL ALCOHOL 1.4 % OP SOLN: 1.0000 [drp] | Freq: Four times a day (QID) | OPHTHALMIC | Status: DC | PRN

## 2023-03-03 MED ORDER — ONDANSETRON 4 MG PO TBDP: 4.0000 mg | ORAL_TABLET | Freq: Four times a day (QID) | ORAL | Status: DC | PRN

## 2023-03-03 MED ORDER — ACETAMINOPHEN 650 MG RE SUPP: 650.0000 mg | Freq: Four times a day (QID) | RECTAL | Status: DC | PRN

## 2023-03-03 MED ORDER — HYDROMORPHONE HCL-NACL 50-0.9 MG/50ML-% IV SOLN: 0.5000 mg/h | INTRAVENOUS | Status: DC

## 2023-03-03 MED ORDER — FUROSEMIDE 10 MG/ML IJ SOLN
40.0000 mg | Freq: Once | INTRAMUSCULAR | Status: AC
  Administered 2023-03-03: 40 mg via INTRAVENOUS

## 2023-03-03 MED ORDER — HALOPERIDOL 0.5 MG PO TABS: 0.5000 mg | ORAL_TABLET | ORAL | Status: DC | PRN

## 2023-03-03 MED ORDER — ACETAMINOPHEN 325 MG PO TABS: 650.0000 mg | ORAL_TABLET | Freq: Four times a day (QID) | ORAL | Status: DC | PRN

## 2023-03-03 MED ORDER — FUROSEMIDE 10 MG/ML IJ SOLN
INTRAMUSCULAR | Status: AC
  Filled 2023-03-03: qty 4

## 2023-03-03 MED ORDER — GLYCOPYRROLATE 0.2 MG/ML IJ SOLN: 0.2000 mg | INTRAMUSCULAR | Status: DC | PRN

## 2023-03-03 MED ORDER — HYDROMORPHONE BOLUS VIA INFUSION: 0.2500 mg | INTRAVENOUS | Status: DC | PRN

## 2023-03-03 MED ORDER — HALOPERIDOL LACTATE 2 MG/ML PO CONC: 0.5000 mg | ORAL | Status: DC | PRN

## 2023-03-03 MED ORDER — HYDROMORPHONE HCL 1 MG/ML IJ SOLN
0.5000 mg | INTRAMUSCULAR | Status: DC | PRN
  Administered 2023-03-03: 0.5 mg via INTRAVENOUS
  Filled 2023-03-03: qty 0.5

## 2023-03-03 MED ORDER — BIOTENE DRY MOUTH MT LIQD: 15.0000 mL | OROMUCOSAL | Status: DC | PRN

## 2023-03-03 MED ORDER — NOREPINEPHRINE 4 MG/250ML-% IV SOLN
0.0000 ug/min | INTRAVENOUS | Status: DC
  Administered 2023-03-03: 10 ug/min via INTRAVENOUS

## 2023-03-03 MED ORDER — GLYCOPYRROLATE 1 MG PO TABS: 1.0000 mg | ORAL_TABLET | ORAL | Status: DC | PRN

## 2023-03-05 MED FILL — Norepinephrine Bitartrate IV Soln 1 MG/ML (Base Equivalent): INTRAVENOUS | Qty: 4 | Status: AC

## 2023-03-06 DIAGNOSIS — I5020 Unspecified systolic (congestive) heart failure: Secondary | ICD-10-CM | POA: Insufficient documentation

## 2023-03-06 DIAGNOSIS — J15212 Pneumonia due to Methicillin resistant Staphylococcus aureus: Secondary | ICD-10-CM | POA: Insufficient documentation

## 2023-03-06 LAB — CULTURE, BLOOD (ROUTINE X 2)
Culture: NO GROWTH
Culture: NO GROWTH

## 2023-03-07 ENCOUNTER — Encounter: Payer: No Typology Code available for payment source | Admitting: *Deleted

## 2023-03-07 ENCOUNTER — Ambulatory Visit: Payer: 59 | Admitting: Urology

## 2023-03-07 LAB — YEAST SUSCEPTIBILITIES
Amphotericin B MIC: 1
Fluconazole Islt MIC: 8
ISAVUCONAZOLE MIC: 0.12
Itraconazole MIC: 0.5
Posaconazole MIC: 0.5
Voriconazole MIC: 0.12

## 2023-03-08 ENCOUNTER — Ambulatory Visit: Payer: Self-pay | Admitting: Physician Assistant

## 2023-03-10 LAB — CULTURE, BLOOD (ROUTINE X 2)

## 2023-03-11 NOTE — Progress Notes (Signed)
eLink Physician-Brief Progress Note Patient Name: [name] DOB: [birth year] MRN: [id number]   Date of Service  02/21/2023  HPI/Events of Note  I called patient's daughter [name] to update her on his condition, after the update she requested that patient be transitioned to comfort measures. I explained the philosophy and implications of a transition to comfort measures, and gave her an opportunity to ask questions, after the conversation she confirmed her desire to transition her dad to comfort measures.  eICU Interventions  Patient designation changed and patient transitioned to comfort measures, orders entered.        Thomasene Lot [name] 02/23/2023, 3:16 AM

## 2023-03-11 NOTE — Death Summary Note (Signed)
 DEATH SUMMARY   Patient Details  Name: [name] MRN: [id number] DOB: 06/22/1955  Admission/Discharge Information   Admit Date:  2023/03/06  Date of Death: Date of Death: Mar 11, 2023  Time of Death: Time of Death: 0344  Length of Stay: 4  Referring Physician: Benetta Spar, MD   Reason(s) for Hospitalization  Septic shock  Diagnoses  Preliminary cause of death:  Secondary Diagnoses (including complications and co-morbidities):  Principal Problem:   Septic shock (HCC) Active Problems:   Fungemia   AKI (acute kidney injury) (HCC)   MRSA pneumonia (HCC)   Diabetic nephropathy (HCC)   Norovirus   HFrEF (heart failure with reduced ejection fraction) (HCC)   Protein calorie malnutrition (HCC)   Acute metabolic encephalopathy   Malnutrition of moderate degree   Gastrointestinal hemorrhage with melena   Brief Hospital Course (including significant findings, care, treatment, and services provided and events leading to death)  [name] is a 68 y.o. year old male who presented on 2023/03/06 from [facility] after being actively treated for sepsis (MRSA/E coli and bacteremia (diagnosed and treated at [facility]).  He had an episode of hypotension after a large melanotic stool earlier on the day of presentation.  [facility] (his [facility]) reported that pt was improving from a septic standpoint until today.  He reportedly started volume resuscitation while awaiting transport to ED.  Mild improvement in BP but ultimately required initiation of vasopressors via peripheral IV.  His Hgb was found to be 6.1.  ED provider ordered 2 U PRBC at this time.  GI consultation was obtained.  He was admitted to the ICU with a working diagnosis of shock, suspected to be hypovolemic/hemorrhagic secondary to GI bleeding.  He was transfused a total of 3 units PRBC with appropriate rise in hemoglobin.  GI evaluation was limited due to the patient's clinical instability.  He was additionally found  to be growing Candida glabrata from blood cultures and was started on micafungin.  He was treated with doxycycline/daptomycin for MRSA infection.  He completed treatment with meropenem for ESBL-E coli UTI.  His hospitalization was complicated by development of acute renal failure, acute toxometabolic encephalopathy, epistaxis, which was treated with placement of a Rhino Rocket.  Despite maximal supportive care, the patient continued to deteriorate and have increasing supplemental oxygen requirements and vasopressor requirements.  Of note, the patient was DNR/DNI.  On the night of 03/05/2023, the patient was noted to have recurrent epistaxis, which was again treated with packing.  He was noted to have excessive PEG tube drainage, prompting discontinuation of tube feeds.  He developed gross hematuria.  The patient's daughter [name]) was updated by phone, at which time decision was made to transition to COMFORT MEASURES ONLY.  Time of death  03-11-2023 0344.   Pertinent Labs and Studies  Significant Diagnostic Studies DG Chest Port 1 View Result Date: 03/02/2023 CLINICAL DATA:  [id number] Hypoxia [id number] EXAM: PORTABLE CHEST 1 VIEW COMPARISON:  March 01, 2023, Mar 06, 2023 FINDINGS: The cardiomediastinal silhouette is unchanged and enlarged in contour.RIGHT neck CVC tip terminates over the superior cavoatrial junction. No pleural effusion. No pneumothorax. Perihilar predominant airspace opacities with vascular indistinctness. These are favored mildly increased since prior with increased LEFT retrocardiac confluence. IMPRESSION: Favored slight increase in perihilar predominant opacities, particularly in the LEFT retrocardiac area. Electronically Signed   By: Meda Klinefelter M.D.   On: 03/02/2023 13:08   DG CHEST PORT 1 VIEW Result Date: 03/01/2023 CLINICAL DATA:  [id number] PNA (pneumonia) [id number]  EXAM: PORTABLE CHEST 1 VIEW COMPARISON:  February 27, 2023 FINDINGS: The cardiomediastinal silhouette is  unchanged in contour.RIGHT neck CVC tip terminates over the superior cavoatrial junction. No pleural effusion. No pneumothorax. Similar RIGHT perihilar predominant airspace opacities. Mildly increased confluence of the LEFT retrocardiac and LEFT perihilar predominant opacity IMPRESSION: Mildly increased confluence of the LEFT retrocardiac and LEFT perihilar predominant opacity. Similar appearance of RIGHT perihilar predominant consolidative opacities. Electronically Signed   By: Meda Klinefelter M.D.   On: 03/01/2023 13:29   CT ABDOMEN PELVIS WO CONTRAST Result Date: 02/28/2023 CLINICAL DATA:  Lower gastrointestinal bleed. EXAM: CT ABDOMEN AND PELVIS WITHOUT CONTRAST TECHNIQUE: Multidetector CT imaging of the abdomen and pelvis was performed following the standard protocol without IV contrast. RADIATION DOSE REDUCTION: This exam was performed according to the departmental dose-optimization program which includes automated exposure control, adjustment of the mA and/or kV according to patient size and/or use of iterative reconstruction technique. COMPARISON:  CT abdomen without contrast is 01/20/2018 FINDINGS: Lower chest: There is dense bibasilar consolidation most suggestive of multifocal pneumonia. This consolidation is new from comparison CT. Hepatobiliary: No focal hepatic lesion. Normal gallbladder. No biliary duct dilatation. Common bile duct is normal. Pancreas: Pancreas is normal. No ductal dilatation. No pancreatic inflammation. Spleen: Normal spleen Adrenals/urinary tract: Adrenal glands kidneys ureters normal. Foley catheter within bladder. Stomach/Bowel: Percutaneous gastrostomy tube in the stomach. Small volume oral contrast bowel. No dilated large bowel or small bowel. Terminal ileum normal. High-density fluid within the rectum may relate to oral contrast. Vascular/Lymphatic: Abdominal aorta is normal caliber with atherosclerotic calcification. There is no retroperitoneal or periportal  lymphadenopathy. No pelvic lymphadenopathy. Reproductive: Prostate unremarkable. Other: No free fluid. Musculoskeletal: No aggressive osseous lesion. IMPRESSION: 1. Dense bibasilar consolidation most suggestive of multifocal pneumonia. 2. Percutaneous gastrostomy tube in the stomach. 3. Dense fluid the rectum may relate to oral contrast. 4. Foley catheter within the bladder. 5.  Aortic Atherosclerosis (ICD10-I70.0). Electronically Signed   By: Genevive Bi M.D.   On: 02/28/2023 15:16   US Abdomen Limited RUQ (LIVER/GB) Result Date: 02/27/2023 CLINICAL DATA:  Elevated LFTs EXAM: ULTRASOUND ABDOMEN LIMITED RIGHT UPPER QUADRANT COMPARISON:  None Available. FINDINGS: Gallbladder: Gallbladder is contracted. Scattered small stones are identified. No other focal abnormality is seen. Common bile duct: Diameter: 6.6 mm. Liver: Liver is somewhat mildly nodular. No focal mass is seen. Portal vein is patent on color Doppler imaging with normal direction of blood flow towards the liver. Other: None. IMPRESSION: Chronic cholelithiasis without complicating factors. Mild nodularity of the liver. Electronically Signed   By: Alcide Clever M.D.   On: 02/27/2023 21:56   DG CHEST PORT 1 VIEW Result Date: 02/27/2023 CLINICAL DATA:  Central line placement. EXAM: PORTABLE CHEST 1 VIEW COMPARISON:  Chest radiograph dated 02/26/2023. FINDINGS: Right IJ central venous line with tip in the region of the cavoatrial junction. Bilateral confluent pulmonary opacities, right greater than left, new or progressed since the prior radiograph. No pleural effusion pneumothorax. Stable cardiac silhouette. Atherosclerotic calcification of the aorta. No acute osseous pathology. IMPRESSION: 1. Right IJ central venous line with tip in the region of the cavoatrial junction. No pneumothorax. 2. Pneumonia. Electronically Signed   By: Elgie Collard M.D.   On: 02/27/2023 18:08   ECHOCARDIOGRAM COMPLETE Result Date: 02/27/2023    ECHOCARDIOGRAM  REPORT   Patient Name:   [name] Date of Exam: 02/27/2023 Medical Rec #:  [id number]        Height:  69.0 in Accession #:    [id number]       Weight:       197.3 lb Date of Birth:  Jun 04, 1955        BSA:          2.054 m Patient Age:    67 years         BP:           100/66 mmHg Patient Gender: M                HR:           102 bpm. Exam Location:  Inpatient Procedure: 2D Echo, Cardiac Doppler, Color Doppler and Intracardiac            Opacification Agent (Both Spectral and Color Flow Doppler were            utilized during procedure). Indications:    I50.40* Unspecified combined systolic (congestive) and diastolic                 (congestive) heart failure  History:        Patient has prior history of Echocardiogram examinations, most                 recent 12/22/2022. Cardiomyopathy and CHF, Abnormal ECG, Stroke;                 Risk Factors:Current Smoker, Diabetes, Hypertension and                 Dyslipidemia. Shock. Cocaine use. Apical thrombus.  Sonographer:    Sheralyn Boatman RDCS Referring Phys: [id number] JESSICA MARSHALL  Sonographer Comments: Supine. IMPRESSIONS  1. No evidence of LV apical thrombus with definity contrast.. Left ventricular ejection fraction, by estimation, is 35 to 40%. The left ventricle has moderately decreased function. The left ventricle demonstrates regional wall motion abnormalities (see scoring diagram/findings for description). The left ventricular internal cavity size was mildly dilated. Indeterminate diastolic filling due to E-A fusion. There is akinesis of the left ventricular, entire apical segment. There is akinesis of the left ventricular, apical septal wall, inferior wall, anterior wall, inferolateral wall, anterolateral wall and lateral wall. There is hypokinesis of the left ventricular, basal-mid inferoseptal wall.  2. Right ventricular systolic function is normal. The right ventricular size is normal. Tricuspid regurgitation signal is inadequate for assessing PA  pressure.  3. The mitral valve is normal in structure. Trivial mitral valve regurgitation. No evidence of mitral stenosis.  4. The aortic valve was not well visualized. Aortic valve regurgitation is not visualized. Aortic valve sclerosis/calcification is present, without any evidence of aortic stenosis.  5. The inferior vena cava is normal in size with greater than 50% respiratory variability, suggesting right atrial pressure of 3 mmHg. FINDINGS  Left Ventricle: No evidence of LV apical thrombus with definity contrast. Left ventricular ejection fraction, by estimation, is 35 to 40%. The left ventricle has moderately decreased function. The left ventricle demonstrates regional wall motion abnormalities. Definity contrast agent was given IV to delineate the left ventricular endocardial borders. Strain imaging was not performed. The left ventricular internal cavity size was mildly dilated. There is no left ventricular hypertrophy. Indeterminate diastolic filling due to E-A fusion. Normal left ventricular filling pressure. Right Ventricle: The right ventricular size is normal. No increase in right ventricular wall thickness. Right ventricular systolic function is normal. Tricuspid regurgitation signal is inadequate for assessing PA pressure. Left Atrium: Left atrial size was normal in size. Right Atrium: Right  atrial size was normal in size. Pericardium: There is no evidence of pericardial effusion. Mitral Valve: The mitral valve is normal in structure. Trivial mitral valve regurgitation. No evidence of mitral valve stenosis. Tricuspid Valve: The tricuspid valve is normal in structure. Tricuspid valve regurgitation is not demonstrated. No evidence of tricuspid stenosis. Aortic Valve: The aortic valve was not well visualized. Aortic valve regurgitation is not visualized. Aortic valve sclerosis/calcification is present, without any evidence of aortic stenosis. Pulmonic Valve: The pulmonic valve was normal in structure.  Pulmonic valve regurgitation is not visualized. No evidence of pulmonic stenosis. Aorta: The aortic root is normal in size and structure. Venous: The inferior vena cava is normal in size with greater than 50% respiratory variability, suggesting right atrial pressure of 3 mmHg. IAS/Shunts: No atrial level shunt detected by color flow Doppler. Additional Comments: 3D imaging was not performed.  LEFT VENTRICLE PLAX 2D LVIDd:         5.80 cm      Diastology LVIDs:         4.60 cm      LV e' medial:    10.90 cm/s LV PW:         1.00 cm      LV E/e' medial:  7.2 LV IVS:        1.00 cm      LV e' lateral:   15.60 cm/s LVOT diam:     2.20 cm      LV E/e' lateral: 5.1 LV SV:         70 LV SV Index:   34 LVOT Area:     3.80 cm  LV Volumes (MOD) LV vol d, MOD A2C: 112.0 ml LV vol d, MOD A4C: 91.2 ml LV vol s, MOD A2C: 87.9 ml LV vol s, MOD A4C: 79.6 ml LV SV MOD A2C:     24.1 ml LV SV MOD A4C:     91.2 ml LV SV MOD BP:      19.1 ml RIGHT VENTRICLE             IVC RV S prime:     12.10 cm/s  IVC diam: 1.80 cm TAPSE (M-mode): 2.2 cm LEFT ATRIUM             Index        RIGHT ATRIUM           Index LA diam:        3.40 cm 1.66 cm/m   RA Area:     15.20 cm LA Vol (A2C):   78.0 ml 37.97 ml/m  RA Volume:   41.10 ml  20.01 ml/m LA Vol (A4C):   15.8 ml 7.69 ml/m LA Biplane Vol: 36.7 ml 17.87 ml/m  AORTIC VALVE LVOT Vmax:   127.00 cm/s LVOT Vmean:  87.800 cm/s LVOT VTI:    0.184 m  AORTA Ao Root diam: 3.70 cm Ao Asc diam:  3.10 cm MITRAL VALVE MV Area (PHT): 6.71 cm     SHUNTS MV Decel Time: 113 msec     Systemic VTI:  0.18 m MV E velocity: 78.80 cm/s   Systemic Diam: 2.20 cm MV A velocity: 106.00 cm/s MV E/A ratio:  0.74 Armanda Magic MD Electronically signed by Armanda Magic MD Signature Date/Time: 02/27/2023/1:57:29 PM    Final    [place] EKG SITE RITE Result Date: 02/27/2023 If Site Rite image not attached, placement could not be confirmed due to current cardiac rhythm.  DG Chest Port 1  View Result Date: 02/26/2023 CLINICAL  DATA:  Sepsis EXAM: PORTABLE CHEST 1 VIEW COMPARISON:  01/30/2023 FINDINGS: Stable pulmonary insufflation. Small right pleural effusion. Progressive perihilar pulmonary infiltrate most in keeping with perihilar pulmonary edema. Stable cardiomegaly. No pneumothorax. No acute bone abnormality. IMPRESSION: 1. Cardiomegaly with progressive perihilar pulmonary edema and small right pleural effusion. Electronically Signed   By: Helyn Numbers M.D.   On: 02/26/2023 22:25   DG Abd 1 View Result Date: 02/07/2023 CLINICAL DATA:  Persistent emesis. EXAM: ABDOMEN - 1 VIEW COMPARISON:  02/03/2023 FINDINGS: Technically limited due to difficulty with positioning. Patient is rotated. Gastrostomy tube is faintly visualized. No gaseous bowel distension to suggest obstruction. There is formed stool in the distal colon. Portions of the left abdomen are not included in the field of view. IMPRESSION: No bowel obstruction or abnormal distention. Small volume formed stool in the distal colon. Electronically Signed   By: Narda Rutherford M.D.   On: 02/07/2023 19:23    Microbiology Recent Results (from the past 240 hours)  Blood Culture (routine x 2)     Status: Abnormal   Collection Time: 02/26/23 10:05 PM   Specimen: BLOOD  Result Value Ref Range Status   Specimen Description BLOOD RIGHT ANTECUBITAL  Final   Special Requests   Final    BOTTLES DRAWN AEROBIC AND ANAEROBIC Blood Culture adequate volume   Culture  Setup Time   Final    BUDDING YEAST SEEN AEROBIC BOTTLE ONLY CRITICAL VALUE NOTED.  VALUE IS CONSISTENT WITH PREVIOUSLY REPORTED AND CALLED VALUE. Performed at [facility], [address], [place], [place] [address]    Culture CANDIDA GLABRATA (A)  Final   Report Status 21-Mar-2023 FINAL  Final  Resp panel by RT-PCR (RSV, Flu A&B, Covid)     Status: None   Collection Time: 02/26/23 10:07 PM   Specimen: Nasal Swab  Result Value Ref Range Status   SARS Coronavirus 2 by RT PCR NEGATIVE NEGATIVE Final    Influenza A by PCR NEGATIVE NEGATIVE Final   Influenza B by PCR NEGATIVE NEGATIVE Final    Comment: (NOTE) The Xpert Xpress SARS-CoV-2/FLU/RSV plus assay is intended as an aid in the diagnosis of influenza from Nasopharyngeal swab specimens and should not be used as a sole basis for treatment. Nasal washings and aspirates are unacceptable for Xpert Xpress SARS-CoV-2/FLU/RSV testing.  Fact Sheet for Patients: BloggerCourse.com  Fact Sheet for Healthcare Providers: SeriousBroker.it  This test is not yet approved or cleared by the [place] FDA and has been authorized for detection and/or diagnosis of SARS-CoV-2 by FDA under an Emergency Use Authorization (EUA). This EUA will remain in effect (meaning this test can be used) for the duration of the COVID-19 declaration under Section 564(b)(1) of the Act, 21 U.S.C. section 360bbb-3(b)(1), unless the authorization is terminated or revoked.     Resp Syncytial Virus by PCR NEGATIVE NEGATIVE Final    Comment: (NOTE) Fact Sheet for Patients: BloggerCourse.com  Fact Sheet for Healthcare Providers: SeriousBroker.it  This test is not yet approved or cleared by the [place] FDA and has been authorized for detection and/or diagnosis of SARS-CoV-2 by FDA under an Emergency Use Authorization (EUA). This EUA will remain in effect (meaning this test can be used) for the duration of the COVID-19 declaration under Section 564(b)(1) of the Act, 21 U.S.C. section 360bbb-3(b)(1), unless the authorization is terminated or revoked.  Performed at [facility], [address], [place], [place] [address]   Blood  Culture (routine x 2)     Status: Abnormal (Preliminary result)   Collection Time: 02/26/23 10:10 PM   Specimen: BLOOD  Result Value Ref Range Status   Specimen Description BLOOD LEFT ANTECUBITAL  Final   Special Requests    Final    BOTTLES DRAWN AEROBIC AND ANAEROBIC Blood Culture results may not be optimal due to an inadequate volume of blood received in culture bottles   Culture  Setup Time   Final    BUDDING YEAST SEEN IN BOTH AEROBIC AND ANAEROBIC BOTTLES CRITICAL RESULT CALLED TO, READ BACK BY AND VERIFIED WITH: PHARMDC. AMEND [id number] @1956  FH    Culture (A)  Final    CANDIDA GLABRATA Sent to [facility] for further susceptibility testing. Performed at [facility], [address], [place], [place] [address]    Report Status PENDING  Incomplete  Blood Culture ID Panel (Reflexed)     Status: Abnormal   Collection Time: 02/26/23 10:10 PM  Result Value Ref Range Status   Enterococcus faecalis NOT DETECTED NOT DETECTED Final   Enterococcus Faecium NOT DETECTED NOT DETECTED Final   Listeria monocytogenes NOT DETECTED NOT DETECTED Final   Staphylococcus species NOT DETECTED NOT DETECTED Final   Staphylococcus aureus (BCID) NOT DETECTED NOT DETECTED Final   Staphylococcus epidermidis NOT DETECTED NOT DETECTED Final   Staphylococcus lugdunensis NOT DETECTED NOT DETECTED Final   Streptococcus species NOT DETECTED NOT DETECTED Final   Streptococcus agalactiae NOT DETECTED NOT DETECTED Final   Streptococcus pneumoniae NOT DETECTED NOT DETECTED Final   Streptococcus pyogenes NOT DETECTED NOT DETECTED Final   A.calcoaceticus-baumannii NOT DETECTED NOT DETECTED Final   Bacteroides fragilis NOT DETECTED NOT DETECTED Final   Enterobacterales NOT DETECTED NOT DETECTED Final   Enterobacter cloacae complex NOT DETECTED NOT DETECTED Final   Escherichia coli NOT DETECTED NOT DETECTED Final   Klebsiella aerogenes NOT DETECTED NOT DETECTED Final   Klebsiella oxytoca NOT DETECTED NOT DETECTED Final   Klebsiella pneumoniae NOT DETECTED NOT DETECTED Final   Proteus species NOT DETECTED NOT DETECTED Final   Salmonella species NOT DETECTED NOT DETECTED Final   Serratia marcescens NOT DETECTED NOT DETECTED Final    Haemophilus influenzae NOT DETECTED NOT DETECTED Final   Neisseria meningitidis NOT DETECTED NOT DETECTED Final   Pseudomonas aeruginosa NOT DETECTED NOT DETECTED Final   Stenotrophomonas maltophilia NOT DETECTED NOT DETECTED Final   Candida albicans NOT DETECTED NOT DETECTED Final   Candida auris NOT DETECTED NOT DETECTED Final   Candida glabrata DETECTED (A) NOT DETECTED Final    Comment: CRITICAL RESULT CALLED TO, READ BACK BY AND VERIFIED WITH: PHARMDC. AMEND [id number] @1956  FH    Candida krusei NOT DETECTED NOT DETECTED Final   Candida parapsilosis NOT DETECTED NOT DETECTED Final   Candida tropicalis NOT DETECTED NOT DETECTED Final   Cryptococcus neoformans/gattii NOT DETECTED NOT DETECTED Final    Comment: Performed at [facility], [address] [place], [place] [address]  MRSA Next Gen by PCR, Nasal     Status: Abnormal   Collection Time: 02/27/23  2:47 AM   Specimen: Nasal Mucosa; Nasal Swab  Result Value Ref Range Status   MRSA by PCR Next Gen DETECTED (A) NOT DETECTED Final    Comment: RESULT CALLED TO, READ BACK BY AND VERIFIED WITH: R SARANE,RN@0424  02/27/23 MK (NOTE) The GeneXpert MRSA Assay (FDA approved for NASAL specimens only), is one component of a comprehensive MRSA colonization surveillance program. It is not intended to diagnose MRSA infection  nor to guide or monitor treatment for MRSA infections. Test performance is not FDA approved in patients less than 56 years old. Performed at [facility], [address], [place], [place] [address]   Gastrointestinal Panel by PCR , Stool     Status: Abnormal   Collection Time: 02/27/23  4:29 PM   Specimen: Stool  Result Value Ref Range Status   Campylobacter species NOT DETECTED NOT DETECTED Final   Plesimonas shigelloides NOT DETECTED NOT DETECTED Final   Salmonella species NOT DETECTED NOT DETECTED Final   Yersinia enterocolitica NOT DETECTED NOT DETECTED Final   Vibrio species NOT DETECTED NOT DETECTED  Final   Vibrio cholerae NOT DETECTED NOT DETECTED Final   Enteroaggregative E coli (EAEC) NOT DETECTED NOT DETECTED Final   Enteropathogenic E coli (EPEC) NOT DETECTED NOT DETECTED Final   Enterotoxigenic E coli (ETEC) NOT DETECTED NOT DETECTED Final   Shiga like toxin producing E coli (STEC) NOT DETECTED NOT DETECTED Final   Shigella/Enteroinvasive E coli (EIEC) NOT DETECTED NOT DETECTED Final   Cryptosporidium NOT DETECTED NOT DETECTED Final   Cyclospora cayetanensis NOT DETECTED NOT DETECTED Final   Entamoeba histolytica NOT DETECTED NOT DETECTED Final   Giardia lamblia NOT DETECTED NOT DETECTED Final   Adenovirus F40/41 NOT DETECTED NOT DETECTED Final   Astrovirus NOT DETECTED NOT DETECTED Final   Norovirus GI/GII DETECTED (A) NOT DETECTED Final    Comment: RESULT CALLED TO, READ BACK BY AND VERIFIED WITH: Cathe Mons RN [id number] 02/28/23 HNM    Rotavirus A NOT DETECTED NOT DETECTED Final   Sapovirus (I, II, IV, and V) NOT DETECTED NOT DETECTED Final    Comment: Performed at [facility], [address] [place], [place] [address]  C Difficile Quick Screen w PCR reflex     Status: None   Collection Time: 02/27/23  4:29 PM   Specimen: STOOL  Result Value Ref Range Status   C Diff antigen NEGATIVE NEGATIVE Final   C Diff toxin NEGATIVE NEGATIVE Final   C Diff interpretation No C. difficile detected.  Final    Comment: Performed at [facility], [address], [place], [place] [address]  Expectorated Sputum Assessment w Gram Stain, Rflx to Resp Cult     Status: None   Collection Time: 02/28/23  9:20 AM   Specimen: Sputum  Result Value Ref Range Status   Specimen Description SPUTUM  Final   Special Requests NONE  Final   Sputum evaluation   Final    Sputum specimen not acceptable for testing.  Please recollect.   Gram Stain Report Called to,Read Back By and Verified With: RN Abran Duke [phone] @ [phone] FH Performed at [facility], [address],  [place], [place] [address]    Report Status 03/01/2023 FINAL  Final  Culture, blood (Routine X 2) w Reflex to ID Panel     Status: None   Collection Time: 03/01/23  9:01 PM   Specimen: BLOOD  Result Value Ref Range Status   Specimen Description BLOOD SITE NOT SPECIFIED  Final   Special Requests   Final    BOTTLES DRAWN AEROBIC AND ANAEROBIC Blood Culture results may not be optimal due to an inadequate volume of blood received in culture bottles   Culture   Final    NO GROWTH 5 DAYS Performed at [facility], [address], [place], [place] [address]    Report Status 03/06/2023 FINAL  Final  Culture,  blood (Routine X 2) w Reflex to ID Panel     Status: None   Collection Time: 03/01/23  9:04 PM   Specimen: BLOOD  Result Value Ref Range Status   Specimen Description BLOOD SITE NOT SPECIFIED  Final   Special Requests   Final    BOTTLES DRAWN AEROBIC AND ANAEROBIC Blood Culture results may not be optimal due to an inadequate volume of blood received in culture bottles   Culture   Final    NO GROWTH 5 DAYS Performed at [facility], [address] [place], [place] [address]    Report Status 03/06/2023 FINAL  Final    Lab Basic Metabolic Panel: Recent Labs  Lab 02/27/23 2058 02/28/23 0449 03/01/23 0350 03/02/23 0435 03/02/23 1811 03/02/23 2018  NA 138 141 140 138 138 138  K 4.5 4.5 4.6 4.4 4.7 4.9  CL 110 111 109 109  --   --   CO2 17* 16* 18* 17*  --   --   GLUCOSE 93 82 120* 149*  --   --   BUN 90* 83* 71* 76*  --   --   CREATININE 2.27* 2.14* 1.83* 2.31*  --   --   CALCIUM 8.1* 8.2* 8.2* 8.0*  --   --   MG 2.2 2.2 2.0 2.1  --   --   PHOS  --   --  5.1* 5.2*  --   --    Liver Function Tests: Recent Labs  Lab 02/28/23 0449  AST 62*  ALT 68*  ALKPHOS 245*  BILITOT 1.4*  PROT 5.0*  ALBUMIN <1.5*   No results for input(s): "LIPASE", "AMYLASE" in the last 168 hours. Recent Labs  Lab 02/28/23 0952  AMMONIA 28   CBC: Recent Labs  Lab 02/27/23 2058  02/28/23 0449 02/28/23 0952 03/01/23 0350 03/02/23 1745 03/02/23 1811 03/02/23 2018  WBC 23.0* 23.5* 24.3* 32.8* 18.8*  --   --   NEUTROABS 22.1*  --   --   --  18.8*  --   --   HGB 8.5* 8.3* 7.9* 7.9* 6.6* 6.8* 7.5*  HCT 25.6* 26.0* 24.8* 25.2* 22.0* 20.0* 22.0*  MCV 78.8* 80.5 80.8 81.6 84.3  --   --   PLT 111* 101* 99* 108* 96*  --   --    Cardiac Enzymes: Recent Labs  Lab 03/02/23 0435  CKTOTAL 114   Sepsis Labs: Recent Labs  Lab 02/28/23 0449 02/28/23 0952 03/01/23 0350 03/02/23 1745  WBC 23.5* 24.3* 32.8* 18.8*    Procedures/Operations  02/27/2023: Central venous catheter insertion   Marcelle Smiling 03/06/2023, 7:36 AM

## 2023-03-11 NOTE — Progress Notes (Signed)
Upon pt assessment, noted bleeding from the left nare. This has been an on-going concern today. Contacted e-link. Rahul, PA at bedside to pack nare. Will continue to monitor and reach out with any further concerns. Discussed mental status change with Rahul, PA. STAT CT ordered. Pt became more responsive, but his respiratory status would not be stable for CT. Discussed concerns with Ogan, MD. Warrick Parisian, MD agreed the risks outweighed the benefits of going to CT and noted the pt's improving mental status.   Contacted e-link regarding excessive drainage from the pt's PEG tube. Ogan, MD ok with stopping tube feeds for the night.   Called and updated pt's daughter of the pt's status.  Gaynell Face, MD at bedside. Notified Gaynell Face, MD of concerns and new onset of red-tinged urine. No new orders at this time.   This RN will continue to monitor and assess the pt and notify of any additional changes.    Pt began to drop his O2 sats around 0140. RT immediately called. RT at bedside. E-link called. RT to NT suction the pt. Warrick Parisian, MD on camera at bedside. Spoke with Warrick Parisian, MD regarding the situation and advancements of care for this pt. STAT order of lasix ordered and given. PRN dilaudid given for pt comfort. Pt continues to sat in the 80's and high 70's with the HHFNC and NRB. Continuing to monitor the pt and will notify of any changes.   Pt had 6 beat run of vtach. E-link notified. This RN requested for CCM MD to contact family who was previously updated in the night.   Notified by Warrick Parisian, MD of transition to comfort care.    Pt time of death 0344 pronounced by Vena Austria, RN and Darlis Loan, RN. MD notified. Next of kin notified.

## 2023-03-11 NOTE — Progress Notes (Signed)
Patient currently maxed out on HHF and NRB masks at 15L with SpO2 of 85%. RN at bedside, Teledoc called. Very minimal secretions obtained with NTS and oral suction. MD notified.    03/08/2023 0154  Therapy Vitals  Temp (!) 100.4 F (38 C)  Pulse Rate (!) 114  Resp (!) 22  MEWS Score/Color  MEWS Score 5  MEWS Score Color Red  Respiratory Assessment  Assessment Type Assess only  Respiratory Pattern Labored  Chest Assessment Chest expansion symmetrical  Cough Weak;Congested  Sputum Amount Small  Sputum Color Pink tinged  Sputum Consistency Thick  Sputum Specimen Source Nasal tracheal  Bilateral Breath Sounds Rhonchi  Oxygen Therapy/Pulse Ox  O2 Device HHFNC  $ High Flow Nasal Cannula  Yes  O2 Therapy Oxygen humidified  Heater temperature 98.6 F (37 C)  O2 Flow Rate (L/min) 60 L/min  FiO2 (%) 100 %  SpO2 (!) 85 %

## 2023-03-11 DEATH — deceased
